# Patient Record
Sex: Male | Born: 1935 | Race: White | Hispanic: No | Marital: Married | Smoking: Former smoker
Health system: Southern US, Community
[De-identification: ages and names within clinical notes are randomized; demographics above are authoritative.]

## PROBLEM LIST (undated history)

## (undated) DIAGNOSIS — I2510 Atherosclerotic heart disease of native coronary artery without angina pectoris: Secondary | ICD-10-CM

## (undated) DIAGNOSIS — R062 Wheezing: Secondary | ICD-10-CM

## (undated) DIAGNOSIS — G4730 Sleep apnea, unspecified: Secondary | ICD-10-CM

## (undated) DIAGNOSIS — L97509 Non-pressure chronic ulcer of other part of unspecified foot with unspecified severity: Secondary | ICD-10-CM

## (undated) DIAGNOSIS — R0902 Hypoxemia: Secondary | ICD-10-CM

## (undated) DIAGNOSIS — H3560 Retinal hemorrhage, unspecified eye: Secondary | ICD-10-CM

## (undated) DIAGNOSIS — D7582 Heparin induced thrombocytopenia (HIT): Secondary | ICD-10-CM

## (undated) DIAGNOSIS — R001 Bradycardia, unspecified: Secondary | ICD-10-CM

## (undated) DIAGNOSIS — M47812 Spondylosis without myelopathy or radiculopathy, cervical region: Secondary | ICD-10-CM

## (undated) DIAGNOSIS — E119 Type 2 diabetes mellitus without complications: Secondary | ICD-10-CM

## (undated) DIAGNOSIS — I499 Cardiac arrhythmia, unspecified: Secondary | ICD-10-CM

## (undated) DIAGNOSIS — E782 Mixed hyperlipidemia: Secondary | ICD-10-CM

## (undated) DIAGNOSIS — K5900 Constipation, unspecified: Secondary | ICD-10-CM

## (undated) DIAGNOSIS — Z8719 Personal history of other diseases of the digestive system: Secondary | ICD-10-CM

## (undated) DIAGNOSIS — I10 Essential (primary) hypertension: Secondary | ICD-10-CM

## (undated) DIAGNOSIS — D649 Anemia, unspecified: Secondary | ICD-10-CM

## (undated) DIAGNOSIS — G4700 Insomnia, unspecified: Secondary | ICD-10-CM

## (undated) DIAGNOSIS — H3090 Unspecified chorioretinal inflammation, unspecified eye: Secondary | ICD-10-CM

## (undated) DIAGNOSIS — D75829 Heparin-induced thrombocytopenia, unspecified: Secondary | ICD-10-CM

## (undated) DIAGNOSIS — E11621 Type 2 diabetes mellitus with foot ulcer: Secondary | ICD-10-CM

## (undated) DIAGNOSIS — I739 Peripheral vascular disease, unspecified: Secondary | ICD-10-CM

## (undated) DIAGNOSIS — Z973 Presence of spectacles and contact lenses: Secondary | ICD-10-CM

## (undated) DIAGNOSIS — E039 Hypothyroidism, unspecified: Secondary | ICD-10-CM

## (undated) DIAGNOSIS — I482 Chronic atrial fibrillation, unspecified: Secondary | ICD-10-CM

## (undated) DIAGNOSIS — I5032 Chronic diastolic (congestive) heart failure: Secondary | ICD-10-CM

## (undated) DIAGNOSIS — I480 Paroxysmal atrial fibrillation: Secondary | ICD-10-CM

## (undated) DIAGNOSIS — N289 Disorder of kidney and ureter, unspecified: Secondary | ICD-10-CM

## (undated) DIAGNOSIS — J449 Chronic obstructive pulmonary disease, unspecified: Secondary | ICD-10-CM

## (undated) DIAGNOSIS — K219 Gastro-esophageal reflux disease without esophagitis: Secondary | ICD-10-CM

## (undated) HISTORY — DX: Type 2 diabetes mellitus without complications: E11.9

## (undated) HISTORY — DX: Spondylosis without myelopathy or radiculopathy, cervical region: M47.812

## (undated) HISTORY — DX: Heparin-induced thrombocytopenia, unspecified: D75.829

## (undated) HISTORY — DX: Insomnia, unspecified: G47.00

## (undated) HISTORY — PX: APPENDECTOMY: SHX54

## (undated) HISTORY — PX: BACK SURGERY: SHX140

## (undated) HISTORY — PX: RHINOPLASTY: SUR1284

## (undated) HISTORY — PX: TOTAL KNEE ARTHROPLASTY: SHX125

## (undated) HISTORY — DX: Gastro-esophageal reflux disease without esophagitis: K21.9

## (undated) HISTORY — PX: JOINT REPLACEMENT: SHX530

## (undated) HISTORY — DX: Hypothyroidism, unspecified: E03.9

## (undated) HISTORY — DX: Essential (primary) hypertension: I10

## (undated) HISTORY — DX: Mixed hyperlipidemia: E78.2

## (undated) HISTORY — DX: Heparin induced thrombocytopenia (HIT): D75.82

## (undated) HISTORY — DX: Peripheral vascular disease, unspecified: I73.9

## (undated) HISTORY — DX: Retinal hemorrhage, unspecified eye: H35.60

## (undated) HISTORY — PX: POSTERIOR LUMBAR FUSION: SHX6036

## (undated) HISTORY — PX: CARDIAC CATHETERIZATION: SHX172

## (undated) HISTORY — DX: Non-pressure chronic ulcer of other part of unspecified foot with unspecified severity: L97.509

## (undated) HISTORY — DX: Chronic atrial fibrillation, unspecified: I48.20

## (undated) HISTORY — DX: Type 2 diabetes mellitus with foot ulcer: E11.621

---

## 1998-06-28 ENCOUNTER — Inpatient Hospital Stay (HOSPITAL_COMMUNITY): Admission: EM | Admit: 1998-06-28 | Discharge: 1998-07-03 | Payer: Self-pay | Admitting: Cardiology

## 1998-12-12 ENCOUNTER — Encounter: Payer: Self-pay | Admitting: Neurosurgery

## 1998-12-12 ENCOUNTER — Ambulatory Visit (HOSPITAL_COMMUNITY): Admission: RE | Admit: 1998-12-12 | Discharge: 1998-12-12 | Payer: Self-pay | Admitting: Neurosurgery

## 1998-12-13 ENCOUNTER — Encounter: Payer: Self-pay | Admitting: Neurosurgery

## 1999-03-29 HISTORY — PX: TRANSURETHRAL RESECTION OF PROSTATE: SHX73

## 1999-06-03 ENCOUNTER — Encounter: Admission: RE | Admit: 1999-06-03 | Discharge: 1999-06-03 | Payer: Self-pay | Admitting: Neurosurgery

## 1999-06-03 ENCOUNTER — Encounter: Payer: Self-pay | Admitting: Neurosurgery

## 1999-07-29 ENCOUNTER — Encounter: Payer: Self-pay | Admitting: Neurosurgery

## 1999-07-29 ENCOUNTER — Encounter: Admission: RE | Admit: 1999-07-29 | Discharge: 1999-07-29 | Payer: Self-pay | Admitting: Neurosurgery

## 1999-11-08 ENCOUNTER — Encounter: Admission: RE | Admit: 1999-11-08 | Discharge: 1999-11-08 | Payer: Self-pay | Admitting: Neurosurgery

## 1999-11-08 ENCOUNTER — Encounter: Payer: Self-pay | Admitting: Neurosurgery

## 2000-02-13 ENCOUNTER — Ambulatory Visit (HOSPITAL_COMMUNITY): Admission: RE | Admit: 2000-02-13 | Discharge: 2000-02-13 | Payer: Self-pay | Admitting: Neurosurgery

## 2000-02-13 ENCOUNTER — Encounter: Payer: Self-pay | Admitting: Neurosurgery

## 2000-03-28 HISTORY — PX: LUMBAR FUSION: SHX111

## 2000-04-26 ENCOUNTER — Encounter: Payer: Self-pay | Admitting: Neurosurgery

## 2000-04-28 ENCOUNTER — Inpatient Hospital Stay (HOSPITAL_COMMUNITY): Admission: RE | Admit: 2000-04-28 | Discharge: 2000-05-02 | Payer: Self-pay | Admitting: Neurosurgery

## 2000-04-28 ENCOUNTER — Encounter: Payer: Self-pay | Admitting: Neurosurgery

## 2002-03-28 HISTORY — PX: CATARACT EXTRACTION: SUR2

## 2002-03-28 HISTORY — PX: COLONOSCOPY: SHX174

## 2003-03-14 ENCOUNTER — Encounter: Admission: RE | Admit: 2003-03-14 | Discharge: 2003-03-14 | Payer: Self-pay | Admitting: Neurosurgery

## 2010-03-23 ENCOUNTER — Encounter: Payer: Self-pay | Admitting: Cardiology

## 2010-03-24 ENCOUNTER — Encounter: Payer: Self-pay | Admitting: Physician Assistant

## 2010-03-24 ENCOUNTER — Encounter: Payer: Self-pay | Admitting: Cardiology

## 2010-03-26 ENCOUNTER — Inpatient Hospital Stay (HOSPITAL_COMMUNITY)
Admission: AD | Admit: 2010-03-26 | Discharge: 2010-03-31 | Payer: Self-pay | Attending: Cardiology | Admitting: Cardiology

## 2010-03-26 ENCOUNTER — Encounter: Payer: Self-pay | Admitting: Cardiology

## 2010-03-27 ENCOUNTER — Encounter: Payer: Self-pay | Admitting: Cardiology

## 2010-03-30 ENCOUNTER — Encounter: Payer: Self-pay | Admitting: Cardiology

## 2010-03-31 ENCOUNTER — Encounter: Payer: Self-pay | Admitting: Cardiology

## 2010-03-31 LAB — CBC
HCT: 42.5 % (ref 39.0–52.0)
Hemoglobin: 14.2 g/dL (ref 13.0–17.0)
MCH: 30.7 pg (ref 26.0–34.0)
MCHC: 33.4 g/dL (ref 30.0–36.0)
MCV: 91.8 fL (ref 78.0–100.0)
Platelets: 294 10*3/uL (ref 150–400)
RBC: 4.63 MIL/uL (ref 4.22–5.81)
RDW: 13.9 % (ref 11.5–15.5)
WBC: 10.8 10*3/uL — ABNORMAL HIGH (ref 4.0–10.5)

## 2010-03-31 LAB — BASIC METABOLIC PANEL
BUN: 16 mg/dL (ref 6–23)
CO2: 26 mEq/L (ref 19–32)
Calcium: 8.8 mg/dL (ref 8.4–10.5)
Chloride: 104 mEq/L (ref 96–112)
Creatinine, Ser: 0.88 mg/dL (ref 0.4–1.5)
GFR calc Af Amer: >60 mL/min (ref >60)
GFR calc non Af Amer: >60 mL/min (ref >60)
Glucose, Bld: 171 mg/dL — ABNORMAL HIGH (ref 70–99)
Potassium: 4.2 mEq/L (ref 3.5–5.1)
Sodium: 135 mEq/L (ref 135–145)

## 2010-03-31 LAB — GLUCOSE, CAPILLARY
Glucose-Capillary: 156 mg/dL — ABNORMAL HIGH (ref 70–99)
Glucose-Capillary: 188 mg/dL — ABNORMAL HIGH (ref 70–99)
Glucose-Capillary: 243 mg/dL — ABNORMAL HIGH (ref 70–99)

## 2010-04-14 DIAGNOSIS — E119 Type 2 diabetes mellitus without complications: Secondary | ICD-10-CM | POA: Insufficient documentation

## 2010-04-14 DIAGNOSIS — R0602 Shortness of breath: Secondary | ICD-10-CM | POA: Insufficient documentation

## 2010-04-15 ENCOUNTER — Ambulatory Visit
Admission: RE | Admit: 2010-04-15 | Discharge: 2010-04-15 | Payer: Self-pay | Source: Home / Self Care | Attending: Cardiology | Admitting: Cardiology

## 2010-04-15 DIAGNOSIS — I10 Essential (primary) hypertension: Secondary | ICD-10-CM | POA: Insufficient documentation

## 2010-04-15 DIAGNOSIS — I2510 Atherosclerotic heart disease of native coronary artery without angina pectoris: Secondary | ICD-10-CM | POA: Insufficient documentation

## 2010-04-15 DIAGNOSIS — I471 Supraventricular tachycardia: Secondary | ICD-10-CM | POA: Insufficient documentation

## 2010-04-18 ENCOUNTER — Encounter: Payer: Self-pay | Admitting: Neurosurgery

## 2010-04-21 NOTE — Discharge Summary (Addendum)
Mike Wright, Mike Wright NO.:  [id number]  MEDICAL RECORD NO.:  [id number]          PATIENT TYPE:  INP  LOCATION:  3709                         FACILITY:  [facility]  PHYSICIAN:  Verne Carrow, MDDATE OF BIRTH:  1933/12/31  DATE OF ADMISSION:  03/26/2010 DATE OF DISCHARGE:  03/31/2010                              DISCHARGE SUMMARY   PRIMARY CARDIOLOGIST:  Jonelle Sidle, MD  DISCHARGE DIAGNOSES: 1. Chest pain and dyspnea without objective evidence of ischemia. 2. Nonobstructive coronary artery disease by catheterization this     admission. 3. Recurrent supraventricular tachycardia. 4. Hypertension. 5. Hyperlipidemia. 6. Diabetes mellitus. 7. Remote tobacco abuse. 8. Obesity.  ALLERGIES:  HEPARIN causing heparin-induced thrombocytopenia and CODEINE which causes dyspnea.  PROCEDURES:  Right and left heart rate catheterization performed on March 30, 2010, revealing normal right heart pressures.  Cardiac output of 4.17 L per minute, cardiac index 1.9 L per minute per meter squared. Left heart catheterization revealed mild nonobstructive coronary artery disease with an EF of 60% to 65%.  HISTORY OF PRESENT ILLNESS:  A 77 year old male with the above problem list who over the past several weeks has been experiencing episodes of dyspnea, chest pressure, and lightheadedness lasting a few minutes to as much as 15 minutes and resolving spontaneously.  He presented to [facility] secondary to progressive symptoms and while there had a near-syncopal episode and was noted to be in supraventricular tachycardia with rates up to 150 beats per minute.  Decision was made to transfer him to [facility] for further evaluation of progressive dyspnea and chest pain as well as SVT.  HOSPITAL COURSE:  The patient did have an additional episode of chest discomfort while ambulating on the morning of March 30, 2009.  Decision was made to pursue catheterization which  took place on March 30, 2009, revealing mild nonobstructive CAD with normal LV function as well as normal right heart pressures.  PPI therapy was initiated.  The patient was seen by Dr. Lewayne Bunting with Electrophysiology with recommendation of addition of beta-blocker therapy initially followed by EP study and radiofrequency catheter ablation if intolerant to beta-blockers or SVT recurs.  The patient was placed on Toprol-XL 25 mg daily and to this point has not had recurrence of SVT.  He has been ambulating without recurrent symptoms or limitation.  He will be discharged home today in good condition.  DISCHARGE LABORATORY DATA:  Hemoglobin 14.2, hematocrit 42.5, WBC 10.8, platelets 294,000.  Sodium 135, potassium 4.2, chloride 104, CO2 26, BUN 16, creatinine 0.88, glucose 171.  Total bilirubin 0.5, alkaline phosphatase 46, AST 76, ALT 53, total protein 6.0, albumin 3.3, calcium 8.8.  Hemoglobin A1c 7.4.  BNP 118.  Total cholesterol 77, triglycerides 153, HDL 27, LDL 119.  DISPOSITION:  The patient will be discharged home today in good condition.  FOLLOWUP PLANS AND APPOINTMENTS:  We have arranged for follow up with Dr. Nona Dell in our [facility] on April 15, 2009, at 1:40 p.m.  DISCHARGE MEDICATIONS: 1. Lipitor 10 mg at bedtime. 2. Toprol-XL 25 mg daily. 3. Protonix 40 mg daily. 4. Actos 30 mg daily. 5. Aspirin  81 mg daily. 6. Cozaar 50 mg 2 tablets daily. 7. Diltiazem CD 180 mg daily. 8. Gemfibrozil 600 mg b.i.d. 9. Glipizide 10 mg b.i.d. 10.Hydrochlorothiazide 12.5 mg daily. 11.Metformin 1000 mg b.i.d. to be resumed on April 03, 2010. 12.Trazodone 50 mg at bedtime.  OUTSTANDING LABORATORY DATA AND STUDIES:  The patient will acquire followup lipids and LFTs given new statin therapy.  DURATION OF DISCHARGE ENCOUNTER:  Thiry five minutes including physician time.     Nicolasa Ducking, ANP   ______________________________ Verne Carrow,  MD    CB/MEDQ  D:  03/31/2010  T:  04/01/2010  Job:  [id number]  Electronically Signed by Nicolasa Ducking ANP on 04/19/2010 03:44:23 PM Electronically Signed by Verne Carrow MD on 04/21/2010 04:10:14 PM

## 2010-04-29 NOTE — Assessment & Plan Note (Signed)
Summary: [facility] PER [facility]   Visit Type:  Follow-up Primary Provider:  Dr. Ernestine Conrad   History of Present Illness: 77 year old male presents for followup. He was seen as an inpatient consult at [facility] back in December with an episode of symptomatic SVT associated with syncope and chest pain. Troponin I levels were normal, and d-dimer was increased to 1.45, with low probability ventilation/perfusion lung scan for pulmonary embolus. He was transferred to [facility] for further evaluation which included a cardiac catheterization outlined below, showing no obstructive CAD, and ultimately EP consultation. He was seen by Dr. Ladona Ridgel, with recommendation of medical therapy, and the possibility of a catheter-based ablation if symptoms recur. He was discharged on a combination of metoprolol and diltiazem.  He returns today, denies any significant palpitations, has had no progressive chest pain or syncope. Reports compliance with medications. He does describe some nocturia, also reports increased blood sugars to some degree. He has not seen Dr. Loney Hering back as yet.  I reviewed the findings of his cardiac testing, and we discussed the possibility of considering a catheter-based ablation if his PSVT is not well controlled with medical therapy.  We also discussed exercise. He reports bilateral knee arthritis which limits him to some degree.  Preventive Screening-Counseling & Management  Alcohol-Tobacco     Smoking Status: quit     Year Started: 1950's     Year Quit: 2008     Pack years: 2 PPD 40-50 years  Current Medications (verified): 1)  Lipitor 10 Mg Tabs (Atorvastatin Calcium) .... Take 1 Tablet By Mouth Once A Day At Bedtime 2)  Toprol Xl 25 Mg Xr24h-Tab (Metoprolol Succinate) .... Take 1 Tablet By Mouth Once A Day 3)  Protonix 40 Mg Solr (Pantoprazole Sodium) .... Take 1 Tablet By Mouth Once A Day 4)  Actos 30 Mg Tabs (Pioglitazone Hcl) .... Take 1 Tablet By Mouth Once A Day 5)   Aspirin 81 Mg Tbec (Aspirin) .... Take One Tablet By Mouth Daily 6)  Cozaar 50 Mg Tabs (Losartan Potassium) .... Take 2 Tablets By Mouth Daily 7)  Dilt-Cd 180 Mg Xr24h-Cap (Diltiazem Hcl Coated Beads) .... Take 1 Capsule By Mouth Once A Day 8)  Gemfibrozil 600 Mg Tabs (Gemfibrozil) .... Take 1 Tablet By Mouth Twice A Day 9)  Glipizide 10 Mg Tabs (Glipizide) .... Take 1 Tablet By Mouth Two Times A Day 10)  Hydrochlorothiazide 12.5 Mg Caps (Hydrochlorothiazide) .... Take 1 Capsule By Mouth Once A Day 11)  Metformin Hcl 1000 Mg Tabs (Metformin Hcl) .... Take 1 Tablet By Mouth Two Times A Day 12)  Trazodone Hcl 50 Mg Tabs (Trazodone Hcl) .... Take 1 Tablet By Mouth At Bedtime  Allergies (verified): 1)  ! Codeine 2)  ! Heparin  Comments:  Nurse/Medical Assistant: The patient's medication list and allergies were reviewed with the patient and were updated in the Medication and Allergy Lists.  Past History:  Family History: Last updated: 04/29/2010 Father: died age 39 with MI  Social History: Last updated: 04/29/2010 Single  Tobacco Use - Former.  Alcohol Use - yes, occasional  Past Medical History: CAD - nonobstructive PSVT Diabetes Type 2 G E R D Hypertension History of heparin-induced thrombocytopenia Hyperlipidemia  Past Surgical History: Appendectomy  Family History: Father: died age 41 with MI  Social History: Single  Tobacco Use - Former.  Alcohol Use - yes, occasional Smoking Status:  quit Pack years:  2 PPD 40-50 years  Review of Systems  The patient complains of weight gain and dyspnea on exertion.  The patient denies anorexia, fever, chest pain, syncope, peripheral edema, melena, and hematochezia.         Otherwise reviewed and negative.  Vital Signs:  Patient profile:   77 year old male Height:      70 inches Weight:      237 pounds BMI:     34.13 Pulse rate:   65 / minute BP sitting:   129 / 85  (left arm) Cuff size:   large  Vitals  Entered By: Carlye Grippe (April 15, 2010 1:29 PM)  Nutrition Counseling: Patient's BMI is greater than 25 and therefore counseled on weight management options.  Physical Exam  Additional Exam:  Obese male in no acute distress. HEENT: Conjunctiva and lids normal, oropharynx clear. Neck: Supple, no elevated JVP or bruits. No thyromegaly. Lungs: Clear to auscultation, nonlabored. Cardiac: Regular rate and rhythm, no S3. Abdomen: Soft, nontender, bowel sounds present Extremities: No pitting edema, distal pulses 1-2+. Skin: Warm and dry. Musculoskeletal: No kyphosis. Neuropsychiatric: Alert and oriented x3, affect appropriate.   Cardiac Cath  Procedure date:  03/31/2010  Findings:       Right atrial pressure 7, right ventricular pressure 29/5, right   ventricular end-diastolic pressure 10, pulmonary artery pressure 25/8   with a mean of 17, pulmonary capillary wedge pressure mean of 9.   Cardiac output 4.17 liters per minute.  Cardiac index 1.9 liters per   minute per meter squared.  Pulmonary artery saturation 57%.  Central   aortic saturation 93%.      ANGIOGRAPHIC FINDINGS:   1. The left main coronary artery had no obstructive disease.   2. Left anterior descending was a large vessel that coursed to the       apex and wrapped around the apex.  There were mild luminal       irregularities in the mid and distal vessel.  There was a moderate-       sized diagonal branch that had mild plaque disease.  There were no       focally obstructive lesions in this vessel.   3. The circumflex artery was a moderate-sized vessel that gave off a       small-caliber first obtuse marginal branch and a moderate-sized       second obtuse marginal branch.  There were mild luminal       irregularities in the proximal mid vessel.  The first obtuse       marginal branch was small in caliber, had 30% serial lesions.       Second obtuse marginal branch was moderate sized and had an ostial        30% stenosis.   4. The right coronary artery was a large dominant vessel that had a       discrete distal 30% plaque.  There were no flow-limiting lesions       noted in this vessel.   5. Left ventricular angiogram was performed in the RAO projection,       which showed normal left ventricular systolic function with       ejection fraction of 60-65%.   Impression & Recommendations:  Problem # 1:  PAROXYSMAL SUPRAVENTRICULAR TACHYCARDIA (ICD-427.0)  At this point quiescent. Plan to continue present doses of Toprol-XL and Cardizem CD. He has been evaluated by Dr. Ladona Ridgel, with the possibility of catheter-based ablation remaining if medical therapy is not effective. Follow up in 6 months.  His updated medication list for this problem includes:    Toprol Xl 25 Mg Xr24h-tab (Metoprolol succinate) .[name]... Take 1 tablet by mouth once a day    Aspirin 81 Mg Tbec (Aspirin) .[name]... Take one tablet by mouth daily    Dilt-cd 180 Mg Xr24h-cap (Diltiazem hcl coated beads) .[name]... Take 1 capsule by mouth once a day  Problem # 2:  CORONARY ATHEROSCLEROSIS NATIVE CORONARY ARTERY (ICD-414.01)  Mild, nonobstructive by recent cardiac catheterization.  His updated medication list for this problem includes:    Toprol Xl 25 Mg Xr24h-tab (Metoprolol succinate) .[name]... Take 1 tablet by mouth once a day    Aspirin 81 Mg Tbec (Aspirin) .[name]... Take one tablet by mouth daily    Dilt-cd 180 Mg Xr24h-cap (Diltiazem hcl coated beads) .[name]... Take 1 capsule by mouth once a day  Problem # 3:  ESSENTIAL HYPERTENSION, BENIGN (ICD-401.1)  Continue present medical regimen.  His updated medication list for this problem includes:    Toprol Xl 25 Mg Xr24h-tab (Metoprolol succinate) .[name]... Take 1 tablet by mouth once a day    Aspirin 81 Mg Tbec (Aspirin) .[name]... Take one tablet by mouth daily    Cozaar 50 Mg Tabs (Losartan potassium) .[name]... Take 2 tablets by mouth daily    Dilt-cd 180 Mg Xr24h-cap (Diltiazem hcl coated beads) .[name]... Take  1 capsule by mouth once a day    Hydrochlorothiazide 12.5 Mg Caps (Hydrochlorothiazide) .[name]... Take 1 capsule by mouth once a day  Problem # 4:  DM (ICD-250.00)  Continue follow up with Dr. Loney Hering.  His updated medication list for this problem includes:    Actos 30 Mg Tabs (Pioglitazone hcl) .[name]... Take 1 tablet by mouth once a day    Aspirin 81 Mg Tbec (Aspirin) .[name]... Take one tablet by mouth daily    Cozaar 50 Mg Tabs (Losartan potassium) .[name]... Take 2 tablets by mouth daily    Glipizide 10 Mg Tabs (Glipizide) .[name]... Take 1 tablet by mouth two times a day    Metformin Hcl 1000 Mg Tabs (Metformin hcl) .[name]... Take 1 tablet by mouth two times a day  Patient Instructions: 1)  Your physician wants you to follow-up in: 6 months. You will receive a reminder letter in the mail one-two months in advance. If you don't receive a letter, please call our office to schedule the follow-up appointment. 2)  Your physician recommends that you continue on your current medications as directed. Please refer to the Current Medication list given to you today.

## 2010-04-29 NOTE — Consult Note (Signed)
Summary: CARDIOLOGY CONSULT/  [facility]  CARDIOLOGY CONSULT/  [facility]   Imported By: Zachary Leemon 04/14/2010 15:25:35  _____________________________________________________________________  External Attachment:    Type:   Image     Comment:   External Document

## 2010-04-29 NOTE — Consult Note (Signed)
Summary: Consultation Report  Consultation Report   Imported By: Dorise Hiss 04/14/2010 14:59:17  _____________________________________________________________________  External Attachment:    Type:   Image     Comment:   External Document

## 2010-04-29 NOTE — Letter (Signed)
Summary: [facility] D/C DR. Charlyne Mom  [facility] D/C DR. PETER ENWEANA   Imported By: Zachary Jowell 04/14/2010 15:23:37  _____________________________________________________________________  External Attachment:    Type:   Image     Comment:   External Document

## 2010-04-29 NOTE — Medication Information (Signed)
Summary: [facility] D/C MEDICATION ORDER SHEET  [facility] D/C MEDICATION ORDER SHEET   Imported By: Zachary Toriano 04/14/2010 15:27:08  _____________________________________________________________________  External Attachment:    Type:   Image     Comment:   External Document

## 2010-05-06 ENCOUNTER — Encounter: Payer: Self-pay | Admitting: Cardiology

## 2010-05-06 NOTE — Consult Note (Signed)
NAME:  Mike Wright, Mike Wright NO.:  [id number]  MEDICAL RECORD NO.:  [id number]           PATIENT TYPE:  LOCATION:                                 FACILITY:  PHYSICIAN:  Doylene Canning. Ladona Ridgel, MD    DATE OF BIRTH:  1933/09/29  DATE OF CONSULTATION:  03/30/2010 DATE OF DISCHARGE:                                CONSULTATION   CONSULTATION IS REQUESTED:  Jonelle Sidle, MD  INDICATION FOR CONSULTATION:  Evaluation of recurrent SVT.  HISTORY OF PRESENT ILLNESS:  The patient is a 77 year old man who has a history of hypertension, diabetes, and obesity.  The patient over the last several weeks has noted spells where he would suddenly get short of breath and have chest pressure and get dizzy.  These would last anywhere from a few minutes to as much as 15 minutes.  These spells have increased in frequency and severity.  He had an episode associated with near-syncope and was admitted to the hospital.  On telemetry, he had recurrent episodes of SVT at rates of up to 150 beats per minute. Review of the strips demonstrate what appears to be a short RP tachycardia, though the [place] time is somewhat longer than typically seen with AV node reentrant tachycardia.  The patient notes that he has never felt palpitations and cannot tell that his heart is racing but does have very clearcut sudden onset of dyspnea, chest pressure, and dizziness associated with near-syncope and one episode of frank syncope with these episodes.  It appears that these episodes are related to his SVT.  The patient underwent catheterization which demonstrated no obstructive coronary disease, preserved LV function, and normal pulmonary pressures just today.  His additional past medical history is notable for heparin-induced thrombocytopenia.  He has a history of hypertension.  He has diabetes. Past medical history is notable for spontaneous pneumothorax, approximately 20 years ago  Family history is negative  for premature coronary disease.  SOCIAL HISTORY:  The patient denies tobacco or ethanol abuse.  He currently participates in the [employer] program in [place].  He gives a history of allergy to CODEINE and has heparin-induced thrombocytopenia as previously noted.  REVIEW OF SYSTEMS:  All system reviewed and negative except as noted in the HPI.  PHYSICAL EXAMINATION:  GENERAL:  He is a pleasant obese 77 year old man in no acute distress. VITAL SIGNS:  The blood pressure is 121/75, pulse is 60 and regular, the respirations are 18, and the temperature is 98, oxygen saturation 96%. HEENT:  Normocephalic and atraumatic.  Pupils equal and round.  The oropharynx moist.  His sclerae are anicteric. NECK:  No jugular venous distention.  There are no thyromegaly.  Trachea is midline.  The carotids are 2+ and symmetric. LUNGS:  Clear bilaterally to auscultation.  No wheezes, rales, or rhonchi are present.  There is no increased work of breathing. CARDIAC:  Regular rhythm.  Normal S1 and S2.  The PMI was not enlarged or laterally displaced.  I do not appreciate any murmurs. ABDOMINAL:  Obese, nontender, nondistended.  There is no organomegaly. EXTREMITIES:  No cyanosis, clubbing, or edema.  The pulses are 2+ and symmetric.  The groin demonstrated no hematoma. NEUROLOGIC:  Alert and oriented x3 with cranial nerves intact.  Strength is 5/5 and symmetric.  The EKG demonstrates sinus rhythm with no pre-excitation.  Telemetry strips demonstrate SVT at 145 beats per minute.  IMPRESSION: 1. Recurrent supraventricular tachycardia in the absence of     palpitations, likely associated with chest pain, shortness of     breath, and near-syncope. 2. Hypertension. 3. Obesity. 4. Diabetes.  DISCUSSION:  I have discussed the treatment options with the patient. He has been on low low-dose calcium blockers up until now.  For his hypertension, I have recommended that he start Toprol 25 mg daily  with possible uptitration as needed.  The risks, benefits, goals, and expectations of a catheter ablation procedure have been discussed as well.  Plan will be to start him on beta-blockers and see how he does. If his symptoms resolve on beta-blockers, then we would recommend a period of watchful waiting.  If he is intolerant of beta-blockers or has recurrent SVT despite this, then catheter ablation would be recommended.     Doylene Canning. Ladona Ridgel, MD   ______________________________ Doylene Canning. Ladona Ridgel, MD    GWT/MEDQ  D:  03/30/2010  T:  03/31/2010  Job:  [id number]  cc:   Jonelle Sidle, MD  Electronically Signed by Lewayne Bunting MD on 05/06/2010 05:08:49 PM

## 2010-05-13 NOTE — Medication Information (Signed)
Summary: [employer]:Lipitor/Gemfibrozil  [employer]:Lipitor/Gemfibrozil   Imported By: Cyril Loosen, RN, BSN 05/06/2010 16:59:06  _____________________________________________________________________  External Attachment:    Type:   Image     Comment:   External Document  Appended Document: [employer]:Lipitor/Gemfibrozil Attempted to reach pt but line was busy.  Appended Document: [employer]:Lipitor/Gemfibrozil Left message to call back on voicemail.  Appended Document: [employer]:Lipitor/Gemfibrozil Pt notified and verbalized understanding.

## 2010-06-07 LAB — POCT I-STAT 3, VENOUS BLOOD GAS (G3P V)
Acid-base deficit: 4 mmol/L — ABNORMAL HIGH (ref 0.0–2.0)
Bicarbonate: 22 mEq/L (ref 20.0–24.0)
O2 Saturation: 57 %
TCO2: 23 mmol/L (ref 0–100)
pCO2, Ven: 44.3 mmHg — ABNORMAL LOW (ref 45.0–50.0)
pH, Ven: 7.304 — ABNORMAL HIGH (ref 7.250–7.300)
pO2, Ven: 33 mmHg (ref 30.0–45.0)

## 2010-06-07 LAB — GLUCOSE, CAPILLARY
Glucose-Capillary: 103 mg/dL — ABNORMAL HIGH (ref 70–99)
Glucose-Capillary: 116 mg/dL — ABNORMAL HIGH (ref 70–99)
Glucose-Capillary: 125 mg/dL — ABNORMAL HIGH (ref 70–99)
Glucose-Capillary: 166 mg/dL — ABNORMAL HIGH (ref 70–99)
Glucose-Capillary: 178 mg/dL — ABNORMAL HIGH (ref 70–99)
Glucose-Capillary: 196 mg/dL — ABNORMAL HIGH (ref 70–99)
Glucose-Capillary: 218 mg/dL — ABNORMAL HIGH (ref 70–99)
Glucose-Capillary: 251 mg/dL — ABNORMAL HIGH (ref 70–99)

## 2010-06-07 LAB — COMPREHENSIVE METABOLIC PANEL
Albumin: 3.3 g/dL — ABNORMAL LOW (ref 3.5–5.2)
BUN: 26 mg/dL — ABNORMAL HIGH (ref 6–23)
Calcium: 9.1 mg/dL (ref 8.4–10.5)
Chloride: 101 mEq/L (ref 96–112)
Creatinine, Ser: 0.94 mg/dL (ref 0.4–1.5)
Total Bilirubin: 0.5 mg/dL (ref 0.3–1.2)
Total Protein: 6.8 g/dL (ref 6.0–8.3)

## 2010-06-07 LAB — CBC
HCT: 42.3 % (ref 39.0–52.0)
Hemoglobin: 14.2 g/dL (ref 13.0–17.0)
MCH: 30.6 pg (ref 26.0–34.0)
MCH: 30.6 pg (ref 26.0–34.0)
MCHC: 33.6 g/dL (ref 30.0–36.0)
MCHC: 33.9 g/dL (ref 30.0–36.0)
MCV: 90.2 fL (ref 78.0–100.0)
MCV: 91.2 fL (ref 78.0–100.0)
Platelets: 259 10*3/uL (ref 150–400)
RDW: 13.7 % (ref 11.5–15.5)

## 2010-06-07 LAB — LIPID PANEL
HDL: 27 mg/dL — ABNORMAL LOW (ref >39)
Triglycerides: 153 mg/dL — ABNORMAL HIGH (ref <150)
VLDL: 31 mg/dL (ref 0–40)

## 2010-06-07 LAB — PROTIME-INR: Prothrombin Time: 12.9 seconds (ref 11.6–15.2)

## 2010-06-07 LAB — POCT I-STAT 3, ART BLOOD GAS (G3+)
Bicarbonate: 24.4 mEq/L — ABNORMAL HIGH (ref 20.0–24.0)
O2 Saturation: 93 %
TCO2: 26 mmol/L (ref 0–100)
pCO2 arterial: 39.5 mmHg (ref 35.0–45.0)
pH, Arterial: 7.4 (ref 7.350–7.450)
pO2, Arterial: 68 mmHg — ABNORMAL LOW (ref 80.0–100.0)

## 2010-06-07 LAB — BASIC METABOLIC PANEL
CO2: 26 mEq/L (ref 19–32)
Calcium: 9.1 mg/dL (ref 8.4–10.5)
Glucose, Bld: 169 mg/dL — ABNORMAL HIGH (ref 70–99)
Potassium: 3.9 mEq/L (ref 3.5–5.1)
Sodium: 138 mEq/L (ref 135–145)

## 2010-06-07 LAB — BRAIN NATRIURETIC PEPTIDE: Pro B Natriuretic peptide (BNP): 118 pg/mL — ABNORMAL HIGH (ref 0.0–100.0)

## 2010-06-07 LAB — HEMOGLOBIN A1C: Mean Plasma Glucose: 166 mg/dL — ABNORMAL HIGH (ref <117)

## 2010-08-13 NOTE — H&P (Signed)
[facility]  Patient:    Mike Wright, Mike Wright                        MRN: [id number] Adm. Date:  04/28/00 Attending:  Payton Doughty, M.D.                         History and Physical  ADMISSION DIAGNOSES:  Spondylolysis L5, spondylosis L4-5, and spondylosis L3-4.  HISTORY:  This is a now 77 year old, right-handed white gentleman whom I have been following since 1995.  He has spondylolysis of L5 and has had a slip at L5-S1.  He has spondylitic disk at L4-5 and a face arthropathy with spondylitic change at L3-4.  He is now admitted for a fusion at L3-4, L4-5, and L5-S1.  PAST MEDICAL HISTORY:  Is remarkable for coronary artery disease, for which he has had a heart catheterization.  He has had a meniscectomy in his knee, and an appendectomy.  ALLERGIES:  CODEINE AND HEPARIN.  CURRENT MEDICATIONS: 1. Vicodin. 2. Celebrex.  SOCIAL HISTORY:  He smokes 1/2 pack of cigarettes a day.  Drinks alcohol socially, and is retired as an Dentist.  REVIEW OF SYSTEMS:  Remarkable for back pain and leg pain.  PHYSICAL EXAMINATION:  HEENT:  Within normal limits.  NECK:  He has a reasonable range of motion.  CHEST:  Clear.  CARDIAC:  Regular rate and rhythm.  ABDOMEN:  Nontender, no hepatosplenomegaly.  EXTREMITIES:  No cyanosis, no clubbing.  Peripheral pulses are good.  GENITOURINARY:  Deferred.  NEUROLOGIC:  He is awake, alert, and oriented.  His cranial nerves are intact. Motor examination reveals 5/5 strength throughout the upper and lower extremities, with pain-limited straight leg raise on the right, worse than on the left.  He cannot bend forward because of back pain.  MRI results have been reviewed above.  CLINICAL IMPRESSION:  Lumbar spondylosis at L3-4 and L4-5, with spondylolysis and a grade 1 verging on grade 2 slip at L5-S1.  PLAN:  A lumbar laminectomy, diskectomy, posterior lumbar interbody fusion, with Ray threaded fusion cage  at L4-5 and L5-S1, as well as at L3-4.  The plan is for pedicle screws.  The risks and benefits of this approach have been discussed with him.  He understands that if alignment can be obtained, and good reduction can be obtained, the pedicle screws may not be necessary at L4-5 and L5-S1. DD:  04/28/00 TD:  04/28/00 Job: [id number]

## 2010-08-13 NOTE — Op Note (Signed)
[place]. [facility]  Patient:    Mike Wright, Mike Wright                      MRN: [id number] Proc. Date: 04/28/00 Adm. 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  PROCEDURE:  L3-4, L4-5, L5-S1 laminectomy, diskectomy, posterior lumbar interbody fusion with the Ray Threaded Fusion Cage.  SURGEON:  Payton Doughty, M.D.  ASSISTANT:  Stefani Dama, M.D.  ANESTHESIA:  General endotracheal.  PREP:  Sterile Betadine prep and scrub with alcohol wipe.  COMPLICATIONS:  None.  DESCRIPTION OF PROCEDURE:  This is a 77 year old right-handed white gentleman with spondylolysis of 5 and severe spondylosis at 3-4 and 4-5.  He was taken to the operating room and smoothly anesthetized and intubated, placed prone on the operating table.  Following shave, prep, and drape in the usual sterile fashion, skin was infiltrated with 1% lidocaine and 1:400,000 epinephrine. Skin was incised from the top of L3 to the bottom of S1, and the laminae of L3, L4, L5, and S1 were exposed bilaterally in the subperiosteal plane out over the facet joints.  Intraoperative x-ray confirmed correctness of the level.  The pars interarticularis, laminae, and the inferior facet of L3 and L4 and the lamina and inferior facet of L5 were removed bilaterally.  The superior facet of L4, L5, and S1 were also removed bilaterally.  At L3-4, there was severely degenerated disk with lateral root compression bilaterally, slightly worse on the right than on the left.  At L4-5, there was also severe spondylosis with compressive pathology, worse on the 4 and 5 roots on the right side.  At L5-S1, there was spondylolysis with a grade 1 slip that reduced almost completely  with removal of the posterior elements and significant compression of the left L5 root in its lateral recess.  The right root was also significantly compressed.  S1 roots were relatively unaffected. All nerve roots were carefully explored and found to be free.  Diskectomy was then carried out at all three levels, and Ray Threaded Fusion Cages were placed.  Cages 12 x 21 mm were placed at L5-S1, and 14 x 26 mm cages were placed at 3-4 and 4-5.  Intraoperative x-ray showed good placement of the cages.  There was good reduction of the slip, and the cages had a good, tight fit.  The wound was irrigated and hemostasis assured, and the cages were packed with bone graft harvested from the facet joints and capped.  The fascia was reapproximated with 0 Vicryl in interrupted fashion, subcutaneous tissue was reapproximated with 0 Vicryl in interrupted fashion, and subcuticular tissue was reapproximated with 3-0 Vicryl in interrupted fashion.  Skin was closed with 3-0 nylon in a running locked fashion.  A Betadine Telfa dressing was applied and made occlusive with OpSite.  The patient then returned to the recovery room in good condition. DD:  04/28/00 TD:  04/29/00 Job: [id number]

## 2010-08-13 NOTE — Discharge Summary (Signed)
[place]. [facility]  Patient:    ANUJ, SUMMONS                      MRN: [id number] Adm. Date:  [id number] Disc. Date: [id number] Attending:  Emeterio Reeve                           Discharge Summary  ADMITTING DIAGNOSIS:  Spondylolysis of L5, spondylosis L5-S1 and L3-4.  PROCEDURE:  L3-4, L4-5, L5-S1 laminectomy, diskectomy, and posterior lumbar interbody fusion with right a ______ cage.  COMPLICATIONS:  None.  DISCHARGE STATUS:  Alive and well.  HISTORY OF PRESENT ILLNESS:  [age]-year-old right-handed white gentleman. History and physical is recounted in the chart.  He has had known spondylolysis of L5, he has had a slip at L5-S1 at least since 1995.  It has progressed and he has developed spondylitic change at L3-4 and L4-5 to the point where he is incapacitated with back and leg pain and he is admitted for a fusion.  MEDICAL HISTORY:  Coronary artery disease for which he has had a heart cath, meniscectomy in his knee, and appendectomy.  ALLERGIES:  CODEINE and HEPARIN.  MEDICATIONS:  Vicodin, Celebrex, and Altace.  SOCIAL HISTORY:  Smoked one-half pack of cigarettes a day, drinks alcohol socially and is retired.  PHYSICAL EXAMINATION:  GENERAL:  Unremarkable.  NEUROLOGIC:  Remarkable for full strength with pain limited straight leg on the right worse than on the left.  He has limited range of motion of his back because of back pain.  HOSPITAL COURSE:  He was admitted after ascertaining normal laboratory values and underwent a lumbar interbody fusion at L3-4, L4-5, and L5-S1. Postoperatively, he has done extremely well.  His right leg pain is gone and he has minimal left leg pain.  He has appropriate amount of incisional back pain.  He participated avidly in physical therapy and did extremely well and he was up walking with a walker.  He uses a 3-in-1 and a reacher as well.  He had some slowness of the bowel which has resolved.   He is off his PCA and is on Percocet 5/325 and is being discharged home with a followup in [facility] office in one week for suture removal. DD:  05/02/00 TD:  05/02/00 Job: [id number]

## 2011-03-25 ENCOUNTER — Encounter: Payer: Self-pay | Admitting: *Deleted

## 2011-04-01 ENCOUNTER — Encounter: Payer: Self-pay | Admitting: Cardiology

## 2011-04-01 ENCOUNTER — Ambulatory Visit (INDEPENDENT_AMBULATORY_CARE_PROVIDER_SITE_OTHER): Payer: Medicare Other | Admitting: Cardiology

## 2011-04-01 VITALS — BP 119/80 | HR 60 | Ht 70.0 in | Wt 229.0 lb

## 2011-04-01 DIAGNOSIS — I2510 Atherosclerotic heart disease of native coronary artery without angina pectoris: Secondary | ICD-10-CM

## 2011-04-01 DIAGNOSIS — E119 Type 2 diabetes mellitus without complications: Secondary | ICD-10-CM

## 2011-04-01 DIAGNOSIS — I10 Essential (primary) hypertension: Secondary | ICD-10-CM

## 2011-04-01 DIAGNOSIS — I471 Supraventricular tachycardia: Secondary | ICD-10-CM

## 2011-04-01 NOTE — Assessment & Plan Note (Signed)
Blood pressure well controlled

## 2011-04-01 NOTE — Progress Notes (Signed)
   Clinical Summary Mike Wright is a 78 y.o.male presenting for followup. He was seen in January of last year. Medical therapy has been pursued for management of PSVT.  He is doing well. No angina. Only rare palpitations, nothing prolonged. No dizziness or syncope.  He reports compliance with his medications. He continues to see Dr. Loney Hering regularly for management of diabetes and his lipids.  ECG reviewed.  Allergies  Allergen Reactions  . Codeine     REACTION: sob  . Heparin     REACTION: +HIT    Current Outpatient Prescriptions  Medication Sig Dispense Refill  . aspirin EC 81 MG tablet Take 81 mg by mouth daily.        [name] atorvastatin (LIPITOR) 10 MG tablet Take 10 mg by mouth daily.        [name] diltiazem (CARDIZEM CD) 180 MG 24 hr capsule Take 180 mg by mouth daily.        [name] glipiZIDE (GLUCOTROL) 10 MG tablet Take 10 mg by mouth 2 (two) times daily before a meal.        . hydrochlorothiazide (MICROZIDE) 12.5 MG capsule Take 12.5 mg by mouth daily.        . insulin glargine (LANTUS) 100 UNIT/ML injection Inject 10 Units into the skin at bedtime.        [name] losartan (COZAAR) 50 MG tablet Take 100 mg by mouth daily.        . metFORMIN (GLUCOPHAGE) 1000 MG tablet Take 1,000 mg by mouth 2 (two) times daily with a meal.        . metoprolol succinate (TOPROL-XL) 25 MG 24 hr tablet Take 25 mg by mouth daily.        . pantoprazole (PROTONIX) 40 MG tablet Take 40 mg by mouth daily.        . traZODone (DESYREL) 50 MG tablet Take 50-100 mg by mouth at bedtime.         Past Medical History  Diagnosis Date  . Coronary atherosclerosis of native coronary artery     Nonobstructive  . PSVT (paroxysmal supraventricular tachycardia)   . Type 2 diabetes mellitus   . GERD (gastroesophageal reflux disease)   . Essential hypertension, benign   . Mixed hyperlipidemia   . HIT (heparin-induced thrombocytopenia)     Social History Mike Wright reports that he quit smoking about 5 years ago. His smoking  use included Cigarettes. He has a 100 pack-year smoking history. He has never used smokeless tobacco. Mike Wright reports that he drinks alcohol.  Review of Systems As outlined above, otherwise negative.  Physical Examination Filed Vitals:   04/01/11 1418  BP: 119/80  Pulse: 60   Obese male in no acute distress.  HEENT: Conjunctiva and lids normal, oropharynx clear.  Neck: Supple, no elevated JVP or bruits. No thyromegaly.  Lungs: Clear to auscultation, nonlabored.  Cardiac: Regular rate and rhythm, no S3.  Abdomen: Soft, nontender, bowel sounds present  Extremities: No pitting edema, distal pulses 1-2+.  Skin: Warm and dry.  Musculoskeletal: No kyphosis.  Neuropsychiatric: Alert and oriented x3, affect appropriate.    Problem List and Plan

## 2011-04-01 NOTE — Patient Instructions (Signed)
Your physician you to follow up in 1 year. You will receive a reminder letter in the mail one-two months in advance. If you don't receive a letter, please call our office to schedule the follow-up appointment. Your physician recommends that you continue on your current medications as directed. Please refer to the Current Medication list given to you today. 

## 2011-04-01 NOTE — Assessment & Plan Note (Signed)
Continue followup with Dr. Loney Hering.

## 2011-04-01 NOTE — Assessment & Plan Note (Signed)
Nonobstructive, no active angina. Continue medical therapy. ECG reviewed.

## 2011-04-01 NOTE — Assessment & Plan Note (Signed)
Relatively well controlled on medical therapy. Plan to continue observation, realizing that catheter based ablation could be pursued if necessary. He is comfortable with an annual followup for now.

## 2015-06-11 ENCOUNTER — Ambulatory Visit (INDEPENDENT_AMBULATORY_CARE_PROVIDER_SITE_OTHER): Payer: Medicare HMO | Admitting: Cardiology

## 2015-06-11 ENCOUNTER — Encounter: Payer: Self-pay | Admitting: *Deleted

## 2015-06-11 ENCOUNTER — Encounter: Payer: Self-pay | Admitting: Cardiology

## 2015-06-11 VITALS — BP 146/83 | HR 90 | Ht 70.0 in | Wt 243.0 lb

## 2015-06-11 DIAGNOSIS — R011 Cardiac murmur, unspecified: Secondary | ICD-10-CM | POA: Diagnosis not present

## 2015-06-11 DIAGNOSIS — I2510 Atherosclerotic heart disease of native coronary artery without angina pectoris: Secondary | ICD-10-CM | POA: Diagnosis not present

## 2015-06-11 DIAGNOSIS — I471 Supraventricular tachycardia: Secondary | ICD-10-CM

## 2015-06-11 DIAGNOSIS — I4891 Unspecified atrial fibrillation: Secondary | ICD-10-CM

## 2015-06-11 DIAGNOSIS — Z01810 Encounter for preprocedural cardiovascular examination: Secondary | ICD-10-CM | POA: Diagnosis not present

## 2015-06-11 NOTE — Progress Notes (Addendum)
Patient ID: Mike Wright, male   DOB: September 05, 1933, 82 y.o.   MRN: [id number]     Clinical Summary Mike Wright is a 82 y.o.male last seen by Dr Domenic Polite in 2013, this is our first visit together. He is seen for the following medical problems. Referred by Dr Celedonio Savage.   1. Preoperative evaluation - denies any chest pain. DOE at <1/2 block, that is stable. Denies any LE edema - exertion limited by knee pain  2. PSVT - denies any recent palpitations.   3. CAD - nonobstructive CAD by cath Jan 2012, LVEF 60-65% by LV gram.  - no recent chest pain  4. HTN - compliant with meds  5. SOB - tobacco x 20-30 years. Can have some wheezing at times, chronic cough.    Past Medical History  Diagnosis Date  . Coronary atherosclerosis of native coronary artery     Nonobstructive  . PSVT (paroxysmal supraventricular tachycardia)   . Type 2 diabetes mellitus   . GERD (gastroesophageal reflux disease)   . Essential hypertension, benign   . Mixed hyperlipidemia   . HIT (heparin-induced thrombocytopenia)      Allergies  Allergen Reactions  . Codeine     REACTION: sob  . Heparin     REACTION: +HIT     Current Outpatient Prescriptions  Medication Sig Dispense Refill  . aspirin EC 81 MG tablet Take 81 mg by mouth daily.      [name] atorvastatin (LIPITOR) 10 MG tablet Take 10 mg by mouth daily.      [name] diltiazem (CARDIZEM CD) 180 MG 24 hr capsule Take 180 mg by mouth daily.      [name] glipiZIDE (GLUCOTROL) 10 MG tablet Take 10 mg by mouth 2 (two) times daily before a meal.      . hydrochlorothiazide (MICROZIDE) 12.5 MG capsule Take 12.5 mg by mouth daily.      . insulin glargine (LANTUS) 100 UNIT/ML injection Inject 10 Units into the skin at bedtime.      [name] losartan (COZAAR) 50 MG tablet Take 100 mg by mouth daily.      . metFORMIN (GLUCOPHAGE) 1000 MG tablet Take 1,000 mg by mouth 2 (two) times daily with a meal.      . metoprolol succinate (TOPROL-XL) 25 MG 24 hr tablet Take 25 mg by mouth  daily.      . pantoprazole (PROTONIX) 40 MG tablet Take 40 mg by mouth daily.      . traZODone (DESYREL) 50 MG tablet Take 50-100 mg by mouth at bedtime.      No current facility-administered medications for this visit.     Past Surgical History  Procedure Laterality Date  . Appendectomy       Allergies  Allergen Reactions  . Codeine     REACTION: sob  . Heparin     REACTION: +HIT      Family History  Problem Relation Age of Onset  . Heart attack Father 41     Social History Mike Wright reports that he quit smoking about 9 years ago. His smoking use included Cigarettes. He has a 100 pack-year smoking history. He has never used smokeless tobacco. Mike Wright reports that he drinks alcohol.   Review of Systems CONSTITUTIONAL: No weight loss, fever, chills, weakness or fatigue.  HEENT: Eyes: No visual loss, blurred vision, double vision or yellow sclerae.No hearing loss, sneezing, congestion, runny nose or sore throat.  SKIN: No rash or itching.  CARDIOVASCULAR: per HPI RESPIRATORY:  No shortness of breath, cough or sputum.  GASTROINTESTINAL: No anorexia, nausea, vomiting or diarrhea. No abdominal pain or blood.  GENITOURINARY: No burning on urination, no polyuria NEUROLOGICAL: No headache, dizziness, syncope, paralysis, ataxia, numbness or tingling in the extremities. No change in bowel or bladder control.  MUSCULOSKELETAL: knee pain LYMPHATICS: No enlarged nodes. No history of splenectomy.  PSYCHIATRIC: No history of depression or anxiety.  ENDOCRINOLOGIC: No reports of sweating, cold or heat intolerance. No polyuria or polydipsia.  [name]   Physical Examination Filed Vitals:   06/11/15 1102  BP: 146/83  Pulse: 90   Filed Vitals:   06/11/15 1102  Height: 5\' 10"  (1.778 m)  Weight: 243 lb (110.224 kg)    Gen: resting comfortably, no acute distress HEENT: no scleral icterus, pupils equal round and reactive, no palptable cervical adenopathy,  CV: RRR, 3/6 systolic  murmur RUSB, no jvd Resp: Clear to auscultation bilaterally GI: abdomen is soft, non-tender, non-distended, normal bowel sounds, no hepatosplenomegaly MSK: extremities are warm, no edema.  Skin: warm, no rash Neuro:  no focal deficits Psych: appropriate affect     Assessment and Plan  1. Preoperative evaluation - unable to assess exercise status by history due to knee pain - significant systolic murmur on exam, will obtain echo - pending echo, likely will need stress testing to further risk stratify  2. PSVT - no recent palpitations, continue to monitor  3. CAD - nonobstructive disease by cath 2012 - no recent symptoms - likely stress test to help stratify preoperative risk  4. SOB - f/u echo and likely stress test. Also with symptoms suggestive of COPD, pending cardiac testing may need PFTs.  5. Afib - EKG shows afib, this is a new finding for the patient.  - we will need to discuss possible anticoag at our next follow up, f/u echo results.      Arnoldo Lenis, M.D.    07/03/15 Addendum Cardiac testing is overall benign, recommend proceeding with surgery as planned. Hold eliquis starting 2 days prior to surgery, may resume one day after.    Zandra Abts MD

## 2015-06-11 NOTE — Patient Instructions (Signed)
Your physician recommends that you schedule a follow-up appointment TO BE DETERMINED AFTER TESTING  Your physician recommends that you continue on your current medications as directed. Please refer to the Current Medication list given to you today.  Your physician has requested that you have an echocardiogram. Echocardiography is a painless test that uses sound waves to create images of your heart. It provides your doctor with information about the size and shape of your heart and how well your heart's chambers and valves are working. This procedure takes approximately one hour. There are no restrictions for this procedure.  Thank you for choosing [facility]!!    

## 2015-06-17 ENCOUNTER — Ambulatory Visit (INDEPENDENT_AMBULATORY_CARE_PROVIDER_SITE_OTHER): Payer: Medicare HMO | Admitting: *Deleted

## 2015-06-17 ENCOUNTER — Other Ambulatory Visit: Payer: Self-pay

## 2015-06-17 ENCOUNTER — Ambulatory Visit (INDEPENDENT_AMBULATORY_CARE_PROVIDER_SITE_OTHER): Payer: Medicare HMO

## 2015-06-17 DIAGNOSIS — R011 Cardiac murmur, unspecified: Secondary | ICD-10-CM | POA: Diagnosis not present

## 2015-06-17 DIAGNOSIS — I4891 Unspecified atrial fibrillation: Secondary | ICD-10-CM | POA: Diagnosis not present

## 2015-06-17 MED ORDER — APIXABAN 5 MG PO TABS: 5.0000 mg | ORAL_TABLET | Freq: Two times a day (BID) | ORAL | Status: DC

## 2015-06-17 MED ORDER — DILTIAZEM HCL ER COATED BEADS 240 MG PO CP24: 240.0000 mg | ORAL_CAPSULE | Freq: Every day | ORAL | Status: DC

## 2015-06-17 NOTE — Patient Instructions (Signed)
   Increase Diltiazem to 240mg  daily - new 90 day sent to [facility] today.  Begin Eliquis 5mg  twice a day  - free 30 day trial card & printed script given today. Continue all other medications.   Follow up in 1 month with Lattie Haw (anticoagulation nurse) for new management of Eliquis. Follow up with Dr. [name] will be determined after he officially reads the Echo done today.

## 2015-06-22 ENCOUNTER — Telehealth: Payer: Self-pay | Admitting: *Deleted

## 2015-06-22 DIAGNOSIS — Z01818 Encounter for other preprocedural examination: Secondary | ICD-10-CM

## 2015-06-22 NOTE — Telephone Encounter (Signed)
LM on VM to return call.

## 2015-06-22 NOTE — Telephone Encounter (Signed)
-----   Message from Arnoldo Lenis, MD sent at 06/22/2015 12:46 PM EDT ----- Echo shows heart pumping function is normal. His aortic valve is moderatly thickened (moderate aortic stenosis), but this is not to the degree where it is affecting the heart and something we will monitor. In order to better risk stratify him for surgery please order a lexiscan  Zandra Abts MD

## 2015-06-23 NOTE — Telephone Encounter (Signed)
Mike Wright returned a telephone call.

## 2015-06-24 ENCOUNTER — Encounter: Payer: Self-pay | Admitting: *Deleted

## 2015-06-24 ENCOUNTER — Telehealth: Payer: Self-pay | Admitting: Cardiology

## 2015-06-24 NOTE — Telephone Encounter (Signed)
Lexiscan scheduled for 4/5 at [facility]. Checking percert for [employer]

## 2015-06-24 NOTE — Telephone Encounter (Signed)
Pt aware and agreeable to stress test. Orders placed and will forward to schedulers.

## 2015-07-01 ENCOUNTER — Encounter (HOSPITAL_COMMUNITY): Payer: Medicare HMO

## 2015-07-01 ENCOUNTER — Ambulatory Visit (HOSPITAL_COMMUNITY): Payer: Medicare HMO

## 2015-07-02 ENCOUNTER — Encounter (HOSPITAL_COMMUNITY): Payer: Self-pay

## 2015-07-02 ENCOUNTER — Encounter (HOSPITAL_COMMUNITY)
Admission: RE | Admit: 2015-07-02 | Discharge: 2015-07-02 | Disposition: A | Discharge status: Home / Self Care | Payer: Medicare HMO | Source: Ambulatory Visit | Attending: Cardiology | Admitting: Cardiology

## 2015-07-02 ENCOUNTER — Inpatient Hospital Stay (HOSPITAL_COMMUNITY): Admission: RE | Admit: 2015-07-02 | Payer: Medicare HMO | Source: Ambulatory Visit

## 2015-07-02 DIAGNOSIS — R079 Chest pain, unspecified: Secondary | ICD-10-CM | POA: Insufficient documentation

## 2015-07-02 DIAGNOSIS — Z01818 Encounter for other preprocedural examination: Secondary | ICD-10-CM | POA: Diagnosis not present

## 2015-07-02 LAB — NM MYOCAR MULTI W/SPECT W/WALL MOTION / EF
CHL CUP NUCLEAR SRS: 5
CHL CUP NUCLEAR SSS: 5
CSEPPHR: 83 {beats}/min
LVDIAVOL: 103 mL (ref 62–150)
LVSYSVOL: 37 mL
NUC STRESS TID: 1.17
RATE: 0.31
Rest HR: 59 {beats}/min
SDS: 0

## 2015-07-02 MED ORDER — TECHNETIUM TC 99M SESTAMIBI GENERIC - CARDIOLITE
30.0000 | Freq: Once | INTRAVENOUS | Status: AC | PRN
  Administered 2015-07-02: 30.5 via INTRAVENOUS

## 2015-07-02 MED ORDER — REGADENOSON 0.4 MG/5ML IV SOLN
INTRAVENOUS | Status: AC
  Administered 2015-07-02: 0.4 mg via INTRAVENOUS
  Filled 2015-07-02: qty 5

## 2015-07-02 MED ORDER — TECHNETIUM TC 99M SESTAMIBI - CARDIOLITE
10.0000 | Freq: Once | INTRAVENOUS | Status: AC | PRN
  Administered 2015-07-02: 08:00:00 11 via INTRAVENOUS

## 2015-07-02 MED ORDER — SODIUM CHLORIDE 0.9% FLUSH
INTRAVENOUS | Status: AC
  Filled 2015-07-02: qty 100

## 2015-07-02 MED ORDER — SODIUM CHLORIDE 0.9% FLUSH
INTRAVENOUS | Status: AC
  Administered 2015-07-02: 10 mL via INTRAVENOUS
  Filled 2015-07-02: qty 10

## 2015-07-03 ENCOUNTER — Telehealth: Payer: Self-pay | Admitting: *Deleted

## 2015-07-03 NOTE — Telephone Encounter (Signed)
Pt aware, routed to Dr Case and Wenda Overland

## 2015-07-03 NOTE — Telephone Encounter (Signed)
-----   Message from Arnoldo Lenis, MD sent at 07/03/2015  1:38 PM EDT ----- Please let patient know stress test overall look good. From our standpoint he is ok for surgery, please forward my addended note to his surgeon. He will need to hold eliquis 2 days before surgery and restart day after  J BrancH MD

## 2015-07-14 ENCOUNTER — Ambulatory Visit (INDEPENDENT_AMBULATORY_CARE_PROVIDER_SITE_OTHER): Payer: Medicare HMO | Admitting: *Deleted

## 2015-07-14 DIAGNOSIS — Z5181 Encounter for therapeutic drug level monitoring: Secondary | ICD-10-CM | POA: Diagnosis not present

## 2015-07-14 DIAGNOSIS — I4891 Unspecified atrial fibrillation: Secondary | ICD-10-CM

## 2015-07-14 MED ORDER — APIXABAN 5 MG PO TABS: 5.0000 mg | ORAL_TABLET | Freq: Two times a day (BID) | ORAL | Status: DC

## 2015-07-14 NOTE — Progress Notes (Signed)
Pt was started on Eliquis 5mg  bid for atrial fibrillation on 06/17/15 by Dr [name].    Labs 06/11/15:  SrCr 0.72  CrCl 129.70  Hgb 12.9  Hct 39.7  Wt. 243  Pt has not had any problems taking Eliquis.  Denies excessive bruising, bleeding or GI upset.  Reviewed patients medication list.  Pt is not currently on any combined P-gp and strong CYP3A4 inhibitors/inducers (ketoconazole, traconazole, ritonavir, carbamazepine, phenytoin, rifampin, St. John's wort).  Reviewed labs 07/14/15 @ [facility]:  SCr 0.87, Weight 230, CrCl 101.59.  Dose is appropriate based on age, weight and SrCr.   Hgb and HCT:  13.7/40.1   Pt has lost 13 lbs on a low carb/low sugar diet for his DM.  A full discussion of the nature of anticoagulants has been carried out.  A benefit/risk analysis has been presented to the patient, so that they understand the justification for choosing anticoagulation with Eliquis at this time.  The need for compliance is stressed.  Pt is aware to take the medication twice daily.  Side effects of potential bleeding are discussed, including unusual colored urine or stools, coughing up blood or coffee ground emesis, nose bleeds or serious fall or head trauma.  Discussed signs and symptoms of stroke. The patient should avoid any OTC items containing aspirin or ibuprofen.  Avoid alcohol consumption.   Call if any signs of abnormal bleeding.  Discussed financial obligations and resolved any difficulty in obtaining medication.  Next lab test test in 6 months.  Placed in recall

## 2015-07-15 ENCOUNTER — Telehealth: Payer: Self-pay | Admitting: *Deleted

## 2015-07-15 LAB — CBC
HEMATOCRIT: 40.1 % (ref 37.5–51.0)
Hemoglobin: 13.7 g/dL (ref 12.6–17.7)
MCH: 30.2 pg (ref 26.6–33.0)
MCHC: 34.2 g/dL (ref 31.5–35.7)
MCV: 88 fL (ref 79–97)
Platelets: 181 10*3/uL (ref 150–379)
RBC: 4.54 x10E6/uL (ref 4.14–5.80)
RDW: 15.6 % — AB (ref 12.3–15.4)
WBC: 5.6 10*3/uL (ref 3.4–10.8)

## 2015-07-15 LAB — BASIC METABOLIC PANEL
BUN/Creatinine Ratio: 23 (ref 10–24)
BUN: 20 mg/dL (ref 8–27)
CHLORIDE: 100 mmol/L (ref 96–106)
CO2: 21 mmol/L (ref 18–29)
CREATININE: 0.87 mg/dL (ref 0.76–1.27)
Calcium: 9.2 mg/dL (ref 8.6–10.2)
GFR calc Af Amer: 95 mL/min/{1.73_m2} (ref >59)
GFR calc non Af Amer: 82 mL/min/{1.73_m2} (ref >59)
GLUCOSE: 128 mg/dL — AB (ref 65–99)
Potassium: 4.1 mmol/L (ref 3.5–5.2)
SODIUM: 140 mmol/L (ref 134–144)

## 2015-07-15 NOTE — Telephone Encounter (Signed)
Pt aware, routed to pcp 

## 2015-07-15 NOTE — Telephone Encounter (Signed)
-----   Message from Arnoldo Lenis, MD sent at 07/15/2015  9:25 AM EDT ----- Labs look good  Zandra Abts MD

## 2015-09-18 ENCOUNTER — Ambulatory Visit: Payer: Medicare HMO | Admitting: Cardiology

## 2015-10-20 ENCOUNTER — Encounter: Payer: Self-pay | Admitting: Cardiology

## 2015-10-20 ENCOUNTER — Encounter: Payer: Self-pay | Admitting: *Deleted

## 2015-10-20 ENCOUNTER — Ambulatory Visit (INDEPENDENT_AMBULATORY_CARE_PROVIDER_SITE_OTHER): Payer: Medicare HMO | Admitting: Cardiology

## 2015-10-20 VITALS — BP 112/65 | HR 62 | Ht 70.0 in | Wt 213.4 lb

## 2015-10-20 DIAGNOSIS — I350 Nonrheumatic aortic (valve) stenosis: Secondary | ICD-10-CM | POA: Diagnosis not present

## 2015-10-20 DIAGNOSIS — I2510 Atherosclerotic heart disease of native coronary artery without angina pectoris: Secondary | ICD-10-CM | POA: Diagnosis not present

## 2015-10-20 DIAGNOSIS — I471 Supraventricular tachycardia: Secondary | ICD-10-CM

## 2015-10-20 DIAGNOSIS — I4891 Unspecified atrial fibrillation: Secondary | ICD-10-CM | POA: Diagnosis not present

## 2015-10-20 NOTE — Patient Instructions (Signed)

## 2015-10-20 NOTE — Progress Notes (Signed)
Clinical Summary [name] is a 82 y.o.male seen for the follow up of the following medical problems.   1. PSVT - denies any recent palpitations  2. CAD - nonobstructive CAD by cath Jan 2012, LVEF 60-65% by LV gram.  - 06/2015 nuclear stress without clear ischemia - 05/2015 echo LVEF 65-70%, no WMAs, cannot evaluate diastolic function  - just occasional SOB. Just occasional chest pain, nonspecific.  - compliant with meds  3. HTN - compliant with meds  4. Aortic stenosis - mild by echo 05/2015. Mean grad 13, AVA VTI 1.5 - no significant new symptoms  5. Afib - no recent palpitations. 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    Past Medical History:  Diagnosis Date  . Coronary atherosclerosis of native coronary artery    Nonobstructive  . Essential hypertension, benign   . GERD (gastroesophageal reflux disease)   . HIT (heparin-induced thrombocytopenia) ([place])   . Mixed hyperlipidemia   . PSVT (paroxysmal supraventricular tachycardia) ([place])   . Type 2 diabetes mellitus (HCC)      Allergies  Allergen Reactions  . Codeine     REACTION: sob  . Heparin     REACTION: +HIT     Current Outpatient Prescriptions  Medication Sig Dispense Refill  . apixaban (ELIQUIS) 5 MG TABS tablet Take 1 tablet (5 mg total) by mouth 2 (two) times daily. 60 tablet 4  . aspirin EC 81 MG tablet Take 81 mg by mouth daily.      . Cholecalciferol (D3-1000) 1000 units tablet Take 2,000 Units by mouth daily.    . Cyanocobalamin (B-12) 2500 MCG TABS Take 1 tablet by mouth daily.    [name] diltiazem (CARDIZEM CD) 240 MG 24 hr capsule Take 1 capsule (240 mg total) by mouth daily. 90 capsule 3  . finasteride (PROSCAR) 5 MG tablet Take 1 tablet by mouth daily.    . hydrochlorothiazide (HYDRODIURIL) 25 MG tablet Take 1 tablet by mouth daily.    . insulin glargine (LANTUS) 100 UNIT/ML injection Inject 60 Units into the skin 2 (two)  times daily.     . Magnesium 250 MG TABS Take 500 mg by mouth daily.    . metFORMIN (GLUCOPHAGE) 1000 MG tablet Take 1,000 mg by mouth 2 (two) times daily with a meal.      . metoprolol succinate (TOPROL-XL) 50 MG 24 hr tablet Take 1.5 tablets by mouth daily.    . mirabegron ER (MYRBETRIQ) 25 MG TB24 tablet Take 50 mg by mouth daily.    . traZODone (DESYREL) 50 MG tablet Take 50-100 mg by mouth at bedtime.      No current facility-administered medications for this visit.      Past Surgical History:  Procedure Laterality Date  . APPENDECTOMY       Allergies  Allergen Reactions  . Codeine     REACTION: sob  . Heparin     REACTION: +HIT      Family History  Problem Relation Age of Onset  . Heart attack Father 63     Social History Mr. Mike reports that he quit smoking about 32 years ago. His smoking use included Cigarettes. He started smoking about 62 years ago. He has a 60.00 pack-year smoking history. He has never used smokeless tobacco. Mr. Wright reports that he drinks alcohol.   Review of Systems CONSTITUTIONAL: No weight loss, fever, chills, weakness or fatigue.  HEENT: Eyes: No visual loss, blurred vision, double vision or yellow sclerae.No hearing loss, sneezing, congestion, runny nose or sore throat.  SKIN: No rash or itching.  CARDIOVASCULAR: per HPI RESPIRATORY: per HPI GASTROINTESTINAL: No anorexia, nausea, vomiting or diarrhea. No abdominal pain or blood.  GENITOURINARY: No burning on urination, no polyuria NEUROLOGICAL: No headache, dizziness, syncope, paralysis, ataxia, numbness or tingling in the extremities. No change in bowel or bladder control.  MUSCULOSKELETAL: No muscle, back pain, joint pain or stiffness.  LYMPHATICS: No enlarged nodes. No history of splenectomy.  PSYCHIATRIC: No history of depression or anxiety.  ENDOCRINOLOGIC: No reports of sweating, cold or heat intolerance. No polyuria or polydipsia.  [name]   Physical Examination Vitals:    10/20/15 1533  BP: 112/65  Pulse: 62   Vitals:   10/20/15 1533  Weight: 213 lb 6.4 oz (96.8 kg)  Height: 5\' 10"  (1.778 m)    Gen: resting comfortably, no acute distress HEENT: no scleral icterus, pupils equal round and reactive, no palptable cervical adenopathy,  CV: RRR, 2/6 sysotlic murmur RUSB Resp: Clear to auscultation bilaterally GI: abdomen is soft, non-tender, non-distended, normal bowel sounds, no hepatosplenomegaly MSK: extremities are warm, no edema.  Skin: warm, no rash Neuro:  no focal deficits Psych: appropriate affect   Diagnostic Studies 05/2015 echo Study Conclusions  - Left ventricle: The cavity size was normal. Wall thickness was   increased increased in a pattern of mild to moderate LVH.   Systolic function was vigorous. The estimated ejection fraction   was in the range of 65% to 70%. Wall motion was normal; there   were no regional wall motion abnormalities. The study is not   technically sufficient to allow evaluation of LV diastolic   function. - Aortic valve: Moderately calcified annulus. Mildly thickened   leaflets. There was mild stenosis. There was mild regurgitation.   Mean gradient (S): 13 mm Hg. Valve area (VTI): 1.51 cm^2. - Mitral valve: Mildly calcified annulus. Normal thickness leaflets   . - Left atrium: The atrium was moderately dilated. - Technically adequate study.   06/2015 Nuclear stress test  No diagnostic ST segment changes to indicate ischemia.  Small, mild intensity, perfusion defects noted in the apical anterior and basal inferolateral walls. This is most consistent with soft tissue attenuation given normal wall motion in these regions. No large ischemic zones noted.  This is a low risk study.  Nuclear stress EF: 64%.    Assessment and Plan   1. PSVT - no recent palpitations, we will continue to monitor  2. CAD - nonobstructive disease by cath 2012 - no recent symptoms - continue current meds  3. SOB -  fairly benign cardiac findings. Consider PFTs in the future if symptoms progress  4. Afib - no recent symptoms - CHADS2Vasc score is 3, continue anticoag  5. Aortic stenosis - mild by echo earler this year, conitnue to monitor. No significant symptoms.    Arnoldo Lenis, M.D.

## 2015-11-20 ENCOUNTER — Other Ambulatory Visit: Payer: Self-pay | Admitting: *Deleted

## 2015-11-20 MED ORDER — APIXABAN 5 MG PO TABS: 5.0000 mg | ORAL_TABLET | Freq: Two times a day (BID) | ORAL | 3 refills | Status: DC

## 2015-11-20 MED ORDER — APIXABAN 5 MG PO TABS: 5.0000 mg | ORAL_TABLET | Freq: Two times a day (BID) | ORAL | 0 refills | Status: DC

## 2015-11-23 ENCOUNTER — Other Ambulatory Visit: Payer: Self-pay | Admitting: *Deleted

## 2015-11-23 MED ORDER — APIXABAN 5 MG PO TABS: 5.0000 mg | ORAL_TABLET | Freq: Two times a day (BID) | ORAL | 3 refills | Status: DC

## 2016-05-23 ENCOUNTER — Ambulatory Visit: Payer: Medicare HMO | Admitting: Cardiology

## 2016-05-26 ENCOUNTER — Encounter: Payer: Self-pay | Admitting: *Deleted

## 2016-05-27 ENCOUNTER — Encounter: Payer: Self-pay | Admitting: *Deleted

## 2016-05-27 ENCOUNTER — Encounter: Payer: Self-pay | Admitting: Cardiology

## 2016-05-27 ENCOUNTER — Ambulatory Visit (INDEPENDENT_AMBULATORY_CARE_PROVIDER_SITE_OTHER): Payer: Medicare HMO | Admitting: Cardiology

## 2016-05-27 VITALS — BP 124/79 | HR 96 | Ht 70.0 in | Wt 225.0 lb

## 2016-05-27 DIAGNOSIS — I471 Supraventricular tachycardia, unspecified: Secondary | ICD-10-CM

## 2016-05-27 DIAGNOSIS — R002 Palpitations: Secondary | ICD-10-CM | POA: Diagnosis not present

## 2016-05-27 DIAGNOSIS — I4891 Unspecified atrial fibrillation: Secondary | ICD-10-CM | POA: Diagnosis not present

## 2016-05-27 DIAGNOSIS — I10 Essential (primary) hypertension: Secondary | ICD-10-CM | POA: Diagnosis not present

## 2016-05-27 DIAGNOSIS — I350 Nonrheumatic aortic (valve) stenosis: Secondary | ICD-10-CM

## 2016-05-27 NOTE — Progress Notes (Signed)
Clinical Summary Mike Wright is a 83 y.o.male seen for the follow up of the following medical problems.   1. PSVT - occasional palpitations at times. Can have some associated SOB.  - gets tingle in left arm. Rare chest tightness at times across entire chest.  - episodes will last about 10 minutes. Occurs 2 time day. Feels like heart is skipping.  - symptoms ongoing less than a year.    2. CAD - nonobstructive CAD by cath Jan 2012, LVEF 60-65% by LV gram.  - 06/2015 nuclear stress without clear ischemia - 05/2015 echo LVEF 65-70%, no WMAs, cannot evaluate diastolic function  - just occasional SOB. Just occasional chest pain, nonspecific.  - compliant with meds  3. HTN - compliant with meds  4. Aortic stenosis - mild by echo 05/2015. Mean grad 13, AVA VTI 1.5 - no significant new symptoms since last visit  5. 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   Past Medical History:  Diagnosis Date  . Coronary atherosclerosis of native coronary artery    Nonobstructive  . Essential hypertension, benign   . GERD (gastroesophageal reflux disease)   . HIT (heparin-induced thrombocytopenia) ([place])   . Mixed hyperlipidemia   . PSVT (paroxysmal supraventricular tachycardia) ([place])   . Type 2 diabetes mellitus (HCC)      Allergies  Allergen Reactions  . Codeine     REACTION: sob  . Heparin     REACTION: +HIT     Current Outpatient Prescriptions  Medication Sig Dispense Refill  . apixaban (ELIQUIS) 5 MG TABS tablet Take 1 tablet (5 mg total) by mouth 2 (two) times daily. 180 tablet 3  . aspirin EC 81 MG tablet Take 81 mg by mouth daily.      . Cholecalciferol (D3-1000) 1000 units tablet Take 2,000 Units by mouth daily.    . Cyanocobalamin (B-12) 2500 MCG TABS Take 1 tablet by mouth daily.    [name] diltiazem (CARDIZEM CD) 240 MG 24 hr capsule Take 1 capsule (240 mg total) by mouth daily. 90 capsule 3  .  finasteride (PROSCAR) 5 MG tablet Take 1 tablet by mouth daily.    . hydrochlorothiazide (HYDRODIURIL) 25 MG tablet Take 1 tablet by mouth daily.    . insulin glargine (LANTUS) 100 UNIT/ML injection Inject 70 Units into the skin at bedtime.     . Magnesium 250 MG TABS Take 500 mg by mouth daily.    . metFORMIN (GLUCOPHAGE) 1000 MG tablet Take 1,000 mg by mouth 2 (two) times daily with a meal.      . metoprolol succinate (TOPROL-XL) 50 MG 24 hr tablet Take 1.5 tablets by mouth daily.    . mirabegron ER (MYRBETRIQ) 25 MG TB24 tablet Take 50 mg by mouth daily.    . traZODone (DESYREL) 50 MG tablet Take 50-100 mg by mouth at bedtime.      No current facility-administered medications for this visit.      Past Surgical History:  Procedure Laterality Date  . APPENDECTOMY       Allergies  Allergen Reactions  . Codeine     REACTION: sob  . Heparin     REACTION: +HIT      Family History  Problem Relation Age of Onset  . Heart attack Father 71     Social History Mr. Pegg reports that he quit smoking about 33 years ago. His smoking  use included Cigarettes. He started smoking about 63 years ago. He has a 60.00 pack-year smoking history. He has never used smokeless tobacco. Mr. Ohman reports that he drinks alcohol.   Review of Systems CONSTITUTIONAL: No weight loss, fever, chills, weakness or fatigue.  HEENT: Eyes: No visual loss, blurred vision, double vision or yellow sclerae.No hearing loss, sneezing, congestion, runny nose or sore throat.  SKIN: No rash or itching.  CARDIOVASCULAR: per hpi RESPIRATORY: per hpi  GASTROINTESTINAL: No anorexia, nausea, vomiting or diarrhea. No abdominal pain or blood.  GENITOURINARY: No burning on urination, no polyuria NEUROLOGICAL: No headache, dizziness, syncope, paralysis, ataxia, numbness or tingling in the extremities. No change in bowel or bladder control.  MUSCULOSKELETAL: No muscle, back pain, joint pain or stiffness.  LYMPHATICS: No  enlarged nodes. No history of splenectomy.  PSYCHIATRIC: No history of depression or anxiety.  ENDOCRINOLOGIC: No reports of sweating, cold or heat intolerance. No polyuria or polydipsia.  [name]   Physical Examination Vitals:   05/27/16 1300  BP: 124/79  Pulse: 96   Vitals:   05/27/16 1300  Weight: 225 lb (102.1 kg)  Height: 5\' 10"  (1.778 m)    Gen: resting comfortably, no acute distress HEENT: no scleral icterus, pupils equal round and reactive, no palptable cervical adenopathy,  CV: RRR, no m/r/g, no jvd Resp: Clear to auscultation bilaterally GI: abdomen is soft, non-tender, non-distended, normal bowel sounds, no hepatosplenomegaly MSK: extremities are warm, no edema.  Skin: warm, no rash Neuro:  no focal deficits Psych: appropriate affect   Diagnostic Studies  05/2015 echo Study Conclusions  - Left ventricle: The cavity size was normal. Wall thickness was increased increased in a pattern of mild to moderate LVH. Systolic function was vigorous. The estimated ejection fraction was in the range of 65% to 70%. Wall motion was normal; there were no regional wall motion abnormalities. The study is not technically sufficient to allow evaluation of LV diastolic function. - Aortic valve: Moderately calcified annulus. Mildly thickened leaflets. There was mild stenosis. There was mild regurgitation. Mean gradient (S): 13 mm Hg. Valve area (VTI): 1.51 cm^2. - Mitral valve: Mildly calcified annulus. Normal thickness leaflets . - Left atrium: The atrium was moderately dilated. - Technically adequate study.   06/2015 Nuclear stress test  No diagnostic ST segment changes to indicate ischemia.  Small, mild intensity, perfusion defects noted in the apical anterior and basal inferolateral walls. This is most consistent with soft tissue attenuation given normal wall motion in these regions. No large ischemic zones noted.  This is a low risk study.  Nuclear  stress EF: 64%.    Assessment and Plan  1. PSVT - recent palpitations. EKG in clinic today shows rate controlled afib - we will obtain a 7 day monitor to further evaluate symptoms, evaluate rate control.  2. CAD - nonobstructive disease by cath 2012 - no recent symptoms - he will continue current m  3. Afib - obtain heart monitor as described above - CHADS2Vasc score is 3,, he will  continue anticoag  4. Aortic stenosis - conitnue to monitor. No significant symptoms.    Arnoldo Lenis, M.D.    F/u 6 weeks. Request pcp labs. Consider statin pending labs.

## 2016-05-27 NOTE — Patient Instructions (Signed)
Medication Instructions:  Continue all current medications.  Labwork: none  Testing/Procedures:  Your physician has recommended that you wear a 7 day event monitor. Event monitors are medical devices that record the heart's electrical activity. Doctors most often us these monitors to diagnose arrhythmias. Arrhythmias are problems with the speed or rhythm of the heartbeat. The monitor is a small, portable device. You can wear one while you do your normal daily activities. This is usually used to diagnose what is causing palpitations/syncope (passing out).  Office will contact with results via phone or letter.    Follow-Up: 6 weeks   Any Other Special Instructions Will Be Listed Below (If Applicable).  If you need a refill on your cardiac medications before your next appointment, please call your pharmacy.  

## 2016-06-09 ENCOUNTER — Telehealth: Payer: Self-pay | Admitting: Cardiology

## 2016-06-09 NOTE — Telephone Encounter (Signed)
Patient has not heard from monitor company

## 2016-06-09 NOTE — Telephone Encounter (Signed)
Pt aware that we will contact [employer] - pt will call us back if he hasn't heard from monitor company by Tuesday

## 2016-06-10 ENCOUNTER — Other Ambulatory Visit: Payer: Self-pay | Admitting: Cardiology

## 2016-06-20 ENCOUNTER — Ambulatory Visit (INDEPENDENT_AMBULATORY_CARE_PROVIDER_SITE_OTHER): Payer: Medicare HMO

## 2016-06-20 DIAGNOSIS — R002 Palpitations: Secondary | ICD-10-CM

## 2016-07-06 ENCOUNTER — Encounter: Payer: Self-pay | Admitting: Cardiology

## 2016-07-06 ENCOUNTER — Ambulatory Visit (INDEPENDENT_AMBULATORY_CARE_PROVIDER_SITE_OTHER): Payer: Medicare HMO | Admitting: Cardiology

## 2016-07-06 ENCOUNTER — Encounter: Payer: Self-pay | Admitting: *Deleted

## 2016-07-06 VITALS — BP 154/88 | HR 67 | Ht 70.0 in | Wt 226.0 lb

## 2016-07-06 DIAGNOSIS — R002 Palpitations: Secondary | ICD-10-CM

## 2016-07-06 DIAGNOSIS — I4891 Unspecified atrial fibrillation: Secondary | ICD-10-CM | POA: Diagnosis not present

## 2016-07-06 DIAGNOSIS — I471 Supraventricular tachycardia: Secondary | ICD-10-CM

## 2016-07-06 MED ORDER — METOPROLOL SUCCINATE ER 100 MG PO TB24: 100.0000 mg | ORAL_TABLET | Freq: Every day | ORAL | 1 refills | Status: DC

## 2016-07-06 NOTE — Progress Notes (Signed)
Clinical Summary Mike Wright is a 83 y.o.male seen for the follow up of the following medical problems. This is a focused visit on recent symptoms of palpitations.   1. PSVT/Palpitations.  - occasional palpitations at times. Can have some associated SOB.  - gets tingle in left arm. Rare chest tightness at times across entire chest.  - episodes will last about 10 minutes. Occurs 2 time day. Feels like heart is skipping.  - symptoms ongoing less than a year.   - mild symptoms since last visit, overall tolerable.   2. 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      Past Medical History:  Diagnosis Date  . Coronary atherosclerosis of native coronary artery    Nonobstructive  . Essential hypertension, benign   . GERD (gastroesophageal reflux disease)   . HIT (heparin-induced thrombocytopenia) ([place])   . Mixed hyperlipidemia   . PSVT (paroxysmal supraventricular tachycardia) ([place])   . Type 2 diabetes mellitus (HCC)      Allergies  Allergen Reactions  . Codeine     REACTION: sob  . Heparin     REACTION: +HIT  . Oxycodone Other (See Comments)    "Made me act out of my mind"     Current Outpatient Prescriptions  Medication Sig Dispense Refill  . apixaban (ELIQUIS) 5 MG TABS tablet Take 1 tablet (5 mg total) by mouth 2 (two) times daily. 180 tablet 3  . CARTIA XT 240 MG 24 hr capsule TAKE 1 CAPSULE EVERY DAY  (DOSE  INCREASE) 90 capsule 3  . Cholecalciferol (D3-1000) 1000 units tablet Take 2,000 Units by mouth daily.    . Cyanocobalamin (B-12) 2500 MCG TABS Take 1 tablet by mouth daily.    . finasteride (PROSCAR) 5 MG tablet Take 1 tablet by mouth daily.    . hydrochlorothiazide (HYDRODIURIL) 25 MG tablet Take 1 tablet by mouth daily.      . insulin glargine (LANTUS) 100 UNIT/ML injection Inject 70 Units into the skin at bedtime.     . Magnesium 250 MG TABS Take 500 mg by mouth daily.    . metFORMIN (GLUCOPHAGE) 1000 MG tablet Take 1,000 mg by mouth 2 (two) times daily with a meal.      . metoprolol succinate (TOPROL-XL) 50 MG 24 hr tablet Take 1.5 tablets by mouth daily.    . Potassium 95 MG TABS Take 1 tablet by mouth daily.    . traZODone (DESYREL) 100 MG tablet Take 1 tablet by mouth at bedtime.     No current facility-administered medications for this visit.      Past Surgical History:  Procedure Laterality Date  . APPENDECTOMY       Allergies  Allergen Reactions  . Codeine     REACTION: sob  . Heparin     REACTION: +HIT  . Oxycodone Other (See Comments)    "Made me act out of my mind"      Family History  Problem Relation Age of Onset  . Heart attack Father 41     Social History Mr. Kouba reports that he quit smoking about 33 years ago. His smoking use included Cigarettes. He started smoking about 63 years ago. He has a 60.00 pack-year smoking history. He  has never used smokeless tobacco. Mr. Tall reports that he drinks alcohol.   Review of Systems CONSTITUTIONAL: No weight loss, fever, chills, weakness or fatigue.  HEENT: Eyes: No visual loss, blurred vision, double vision or yellow sclerae.No hearing loss, sneezing, congestion, runny nose or sore throat.  SKIN: No rash or itching.  CARDIOVASCULAR: per HPI RESPIRATORY: No shortness of breath, cough or sputum.  GASTROINTESTINAL: No anorexia, nausea, vomiting or diarrhea. No abdominal pain or blood.  GENITOURINARY: No burning on urination, no polyuria NEUROLOGICAL: No headache, dizziness, syncope, paralysis, ataxia, numbness or tingling in the extremities. No change in bowel or bladder control.  MUSCULOSKELETAL: No muscle, back pain, joint pain or stiffness.  LYMPHATICS: No enlarged nodes. No history of splenectomy.  PSYCHIATRIC: No history  of depression or anxiety.  ENDOCRINOLOGIC: No reports of sweating, cold or heat intolerance. No polyuria or polydipsia.  [name]   Physical Examination Vitals:   07/06/16 1407  BP: (!) 154/88  Pulse: 67   Vitals:   07/06/16 1407  BP: (!) 154/88  Pulse: 67   Vitals:   07/06/16 1407  Weight: 226 lb (102.5 kg)  Height: 5\' 10"  (1.778 m)    Gen: resting comfortably, no acute distress HEENT: no scleral icterus, pupils equal round and reactive, no palptable cervical adenopathy,  CV: irreg, no m/r/g, no jvd Resp: Clear to auscultation bilaterally GI: abdomen is soft, non-tender, non-distended, normal bowel sounds, no hepatosplenomegaly MSK: extremities are warm, no edema.  Skin: warm, no rash Neuro:  no focal deficits Psych: appropriate affect   Diagnostic Studies  05/2015 echo Study Conclusions  - Left ventricle: The cavity size was normal. Wall thickness was increased increased in a pattern of mild to moderate LVH. Systolic function was vigorous. The estimated ejection fraction was in the range of 65% to 70%. Wall motion was normal; there were no regional wall motion abnormalities. The study is not technically sufficient to allow evaluation of LV diastolic function. - Aortic valve: Moderately calcified annulus. Mildly thickened leaflets. There was mild stenosis. There was mild regurgitation. Mean gradient (S): 13 mm Hg. Valve area (VTI): 1.51 cm^2. - Mitral valve: Mildly calcified annulus. Normal thickness leaflets . - Left atrium: The atrium was moderately dilated. - Technically adequate study.   06/2015 Nuclear stress test  No diagnostic ST segment changes to indicate ischemia.  Small, mild intensity, perfusion defects noted in the apical anterior and basal inferolateral walls. This is most consistent with soft tissue attenuation given normal wall motion in these regions. No large ischemic zones noted.  This is a low risk study.  Nuclear stress  EF: 64%.  05/2016 Event monitor  Rhythm is atrial fibrillation throughout study. Occasioanal PVCs  Min HR 51, Max HR 107, Avg HR 67  Reported symptoms correspond with rate controlled atrial fibrillation.    Assessment and Plan  1. PSVT/Palpitations.  - recent palpitations monitor shows rate controlled afib, no other arrhythmias - we will increase Toprol XL from 75mg  to 100mg  daily and follow symptoms.    2. Afib - increase Toprol as described above, continue anticoaglation.CHADS2Vasc score is 3,    F/u 6 months   Arnoldo Lenis, M.D.

## 2016-07-06 NOTE — Patient Instructions (Signed)
Your physician wants you to follow-up in: [place] will receive a reminder letter in the mail two months in advance. If you don't receive a letter, please call our office to schedule the follow-up appointment.  Your physician has recommended you make the following change in your medication:   INCREASE TOPROL XL 100 MG DAILY  Thank you for choosing [facility]!!

## 2016-09-30 ENCOUNTER — Encounter: Payer: Self-pay | Admitting: Cardiology

## 2016-09-30 ENCOUNTER — Ambulatory Visit (INDEPENDENT_AMBULATORY_CARE_PROVIDER_SITE_OTHER): Payer: Medicare HMO | Admitting: Cardiology

## 2016-09-30 ENCOUNTER — Encounter: Payer: Self-pay | Admitting: *Deleted

## 2016-09-30 ENCOUNTER — Telehealth: Payer: Self-pay | Admitting: Cardiology

## 2016-09-30 VITALS — BP 135/72 | HR 58 | Ht 70.0 in | Wt 219.0 lb

## 2016-09-30 DIAGNOSIS — I471 Supraventricular tachycardia: Secondary | ICD-10-CM

## 2016-09-30 DIAGNOSIS — R002 Palpitations: Secondary | ICD-10-CM

## 2016-09-30 DIAGNOSIS — I350 Nonrheumatic aortic (valve) stenosis: Secondary | ICD-10-CM

## 2016-09-30 DIAGNOSIS — R079 Chest pain, unspecified: Secondary | ICD-10-CM | POA: Diagnosis not present

## 2016-09-30 DIAGNOSIS — I4891 Unspecified atrial fibrillation: Secondary | ICD-10-CM | POA: Diagnosis not present

## 2016-09-30 DIAGNOSIS — I10 Essential (primary) hypertension: Secondary | ICD-10-CM

## 2016-09-30 MED ORDER — FUROSEMIDE 20 MG PO TABS: 20.0000 mg | ORAL_TABLET | Freq: Every day | ORAL | 3 refills | Status: DC | PRN

## 2016-09-30 MED ORDER — NITROGLYCERIN 0.4 MG SL SUBL: 0.4000 mg | SUBLINGUAL_TABLET | SUBLINGUAL | 3 refills | Status: DC | PRN

## 2016-09-30 NOTE — Telephone Encounter (Signed)
Pre-cert Verification for the following procedure    Lexiscan scheduled for 10/07/16 at [facility]

## 2016-09-30 NOTE — Progress Notes (Signed)
Clinical Summary Mike Wright is a 83 y.o.male seen today for follow up of the following medical problems.   1. PSVT/Palpitations.  - mild symptoms since last visit, overall tolerable.   2. Afib - since last visit we obtained a 7 day event monitor for palpitations that showed rate controlled afib - mild palpitations since last visit  3. Chronic diastolic HF - admit to [facility] 07/2016 with acute on chronic systolic HF - since discharge can have some occasional SOB. Often comes on at rest, can get bruing area I midchest. +SOB. Lasts for 10 minutes. Started about 2 months ago.  - no recent edema.   4. CAD - nonobstructive CAD by cath Jan 2012, LVEF 60-65% by LV gram.  - 06/2015 nuclear stress without clear ischemia - 05/2015 echo LVEF 65-70%, no WMAs, cannot evaluate diastolic function  - just occasional SOB. Just occasional chest pain, nonspecific.  - compliant with meds  5. HTN - compliant with meds  6. Aortic stenosis - mild by echo 05/2015. 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     Past Medical History:  Diagnosis Date  . Coronary atherosclerosis of native coronary artery    Nonobstructive  . Essential hypertension, benign   . GERD (gastroesophageal reflux disease)   . HIT (heparin-induced thrombocytopenia) ([place])   . Mixed hyperlipidemia   . PSVT (paroxysmal supraventricular tachycardia) ([place])   . Type 2 diabetes mellitus (HCC)      Allergies  Allergen Reactions  . Codeine     REACTION: sob  . Heparin     REACTION: +HIT  . Oxycodone Other (See Comments)    "Made me act out of my mind"     Current Outpatient Prescriptions  Medication Sig Dispense Refill  . apixaban (ELIQUIS) 5 MG TABS tablet Take 1 tablet (5 mg total) by mouth 2 (two) times daily. 180 tablet 3  . CARTIA XT 240 MG 24 hr capsule TAKE 1 CAPSULE EVERY DAY  (DOSE  INCREASE) 90 capsule 3   . Cholecalciferol (D3-1000) 1000 units tablet Take 2,000 Units by mouth daily.    . Cyanocobalamin (B-12) 2500 MCG TABS Take 1 tablet by mouth daily.    . finasteride (PROSCAR) 5 MG tablet Take 1 tablet by mouth daily.    . hydrochlorothiazide (HYDRODIURIL) 25 MG tablet Take 1 tablet by mouth daily.    . insulin glargine (LANTUS) 100 UNIT/ML injection Inject 70 Units into the skin at bedtime.     . Magnesium 250 MG TABS Take 500 mg by mouth daily.    . metFORMIN (GLUCOPHAGE) 1000 MG tablet Take 1,000 mg by mouth 2 (two) times daily with a meal.      . metoprolol succinate (TOPROL XL) 100 MG 24 hr tablet Take 1 tablet (100 mg total) by mouth daily. 90 tablet 1  . Potassium 95 MG TABS Take 1 tablet by mouth daily.    . traZODone (DESYREL) 100 MG tablet Take 1 tablet by mouth at bedtime.     No current facility-administered medications for this visit.      Past Surgical History:  Procedure Laterality Date  . APPENDECTOMY       Allergies  Allergen Reactions  . Codeine     REACTION: sob  . Heparin     REACTION: +HIT  . Oxycodone Other (See Comments)    "Made me  act out of my mind"      Family History  Problem Relation Age of Onset  . Heart attack Father 50     Social History Mike Wright reports that he quit smoking about 33 years ago. His smoking use included Cigarettes. He started smoking about 63 years ago. He has a 60.00 pack-year smoking history. He has never used smokeless tobacco. Mike Wright reports that he drinks alcohol.   Review of Systems CONSTITUTIONAL: No weight loss, fever, chills, weakness or fatigue.  HEENT: Eyes: No visual loss, blurred vision, double vision or yellow sclerae.No hearing loss, sneezing, congestion, runny nose or sore throat.  SKIN: No rash or itching.  CARDIOVASCULAR: per hpi RESPIRATORY: No shortness of breath, cough or sputum.  GASTROINTESTINAL: No anorexia, nausea, vomiting or diarrhea. No abdominal pain or blood.  GENITOURINARY: No  burning on urination, no polyuria NEUROLOGICAL: No headache, dizziness, syncope, paralysis, ataxia, numbness or tingling in the extremities. No change in bowel or bladder control.  MUSCULOSKELETAL: No muscle, back pain, joint pain or stiffness.  LYMPHATICS: No enlarged nodes. No history of splenectomy.  PSYCHIATRIC: No history of depression or anxiety.  ENDOCRINOLOGIC: No reports of sweating, cold or heat intolerance. No polyuria or polydipsia.  [name]   Physical Examination Vitals:   09/30/16 1614  BP: 135/72  Pulse: (!) 58   Vitals:   09/30/16 1614  Weight: 219 lb (99.3 kg)  Height: 5\' 10"  (1.778 m)    Gen: resting comfortably, no acute distress HEENT: no scleral icterus, pupils equal round and reactive, no palptable cervical adenopathy,  CV: irreg, no m/r/g, no jvd Resp: Clear to auscultation bilaterally GI: abdomen is soft, non-tender, non-distended, normal bowel sounds, no hepatosplenomegaly MSK: extremities are warm, no edema.  Skin: warm, no rash Neuro:  no focal deficits Psych: appropriate affect   Diagnostic Studies 05/2015 echo Study Conclusions  - Left ventricle: The cavity size was normal. Wall thickness was increased increased in a pattern of mild to moderate LVH. Systolic function was vigorous. The estimated ejection fraction was in the range of 65% to 70%. Wall motion was normal; there were no regional wall motion abnormalities. The study is not technically sufficient to allow evaluation of LV diastolic function. - Aortic valve: Moderately calcified annulus. Mildly thickened leaflets. There was mild stenosis. There was mild regurgitation. Mean gradient (S): 13 mm Hg. Valve area (VTI): 1.51 cm^2. - Mitral valve: Mildly calcified annulus. Normal thickness leaflets . - Left atrium: The atrium was moderately dilated. - Technically adequate study.   06/2015 Nuclear stress test  No diagnostic ST segment changes to indicate  ischemia.  Small, mild intensity, perfusion defects noted in the apical anterior and basal inferolateral walls. This is most consistent with soft tissue attenuation given normal wall motion in these regions. No large ischemic zones noted.  This is a low risk study.  Nuclear stress EF: 64%.  05/2016 Event monitor  Rhythm is atrial fibrillation throughout study. Occasioanal PVCs  Min HR 51, Max HR 107, Avg HR 67  Reported symptoms correspond with rate controlled atrial fibrillation.    Assessment and Plan  1. PSVT/Palpitations.  - monitor shows rate controlled afib - mild symptoms at times, we will continue current meds   2. Afib - continue current meds - continue anticoaglation.CHADS2Vasc score is 3,    3. CAD - nonobstructive disease by cath 2012 - recent chest pain and SOB, we will obtain a lexiscan - give RX for prn SL NG  4. Aortic stenosis -  conitnue to monitor, no recent symptoms.   5. CHronic diastolic HF - swelling at times, will give Rx for lasix 20mg  prn       Arnoldo Lenis, M.D.,

## 2016-09-30 NOTE — Patient Instructions (Signed)
Your physician recommends that you schedule a follow-up appointment in: [place] has recommended you make the following change in your medication:   START LASIX 20 MG DAILY AS NEEDED FOR SWELLING  Nitroglycerin sublingual tablets What is this medicine? NITROGLYCERIN (nye troe GLI ser in) is a type of vasodilator. It relaxes blood vessels, increasing the blood and oxygen supply to your heart. This medicine is used to relieve chest pain caused by angina. It is also used to prevent chest pain before activities like climbing stairs, going outdoors in cold weather, or sexual activity. This medicine may be used for other purposes; ask your health care provider or pharmacist if you have questions. COMMON BRAND NAME(S): Nitroquick, Nitrostat, Nitrotab What should I tell my health care provider before I take this medicine? They need to know if you have any of these conditions: -anemia -head injury, recent stroke, or bleeding in the brain -liver disease -previous heart attack -an unusual or allergic reaction to nitroglycerin, other medicines, foods, dyes, or preservatives -pregnant or trying to get pregnant -breast-feeding How should I use this medicine? Take this medicine by mouth as needed. At the first sign of an angina attack (chest pain or tightness) place one tablet under your tongue. You can also take this medicine 5 to 10 minutes before an event likely to produce chest pain. Follow the directions on the prescription label. Let the tablet dissolve under the tongue. Do not swallow whole. Replace the dose if you accidentally swallow it. It will help if your mouth is not dry. Saliva around the tablet will help it to dissolve more quickly. Do not eat or drink, smoke or chew tobacco while a tablet is dissolving. If you are not better within 5 minutes after taking ONE dose of nitroglycerin, call 9-1-1 immediately to seek emergency medical care. Do not take more than 3  nitroglycerin tablets over 15 minutes. If you take this medicine often to relieve symptoms of angina, your doctor or health care professional may provide you with different instructions to manage your symptoms. If symptoms do not go away after following these instructions, it is important to call 9-1-1 immediately. Do not take more than 3 nitroglycerin tablets over 15 minutes. Talk to your pediatrician regarding the use of this medicine in children. Special care may be needed. Overdosage: If you think you have taken too much of this medicine contact a poison control center or emergency room at once. NOTE: This medicine is only for you. Do not share this medicine with others. What if I miss a dose? This does not apply. This medicine is only used as needed. What may interact with this medicine? Do not take this medicine with any of the following medications: -certain migraine medicines like ergotamine and dihydroergotamine (DHE) -medicines used to treat erectile dysfunction like sildenafil, tadalafil, and vardenafil -riociguat This medicine may also interact with the following medications: -alteplase -aspirin -heparin -medicines for high blood pressure -medicines for mental depression -other medicines used to treat angina -phenothiazines like chlorpromazine, mesoridazine, prochlorperazine, thioridazine This list may not describe all possible interactions. Give your health care provider a list of all the medicines, herbs, non-prescription drugs, or dietary supplements you use. Also tell them if you smoke, drink alcohol, or use illegal drugs. Some items may interact with your medicine. What should I watch for while using this medicine? Tell your doctor or health care professional if you feel your medicine is no longer working. Keep this medicine with  you at all times. Sit or lie down when you take your medicine to prevent falling if you feel dizzy or faint after using it. Try to remain calm. This  will help you to feel better faster. If you feel dizzy, take several deep breaths and lie down with your feet propped up, or bend forward with your head resting between your knees. You may get drowsy or dizzy. Do not drive, use machinery, or do anything that needs mental alertness until you know how this drug affects you. Do not stand or sit up quickly, especially if you are an older patient. This reduces the risk of dizzy or fainting spells. Alcohol can make you more drowsy and dizzy. Avoid alcoholic drinks. Do not treat yourself for coughs, colds, or pain while you are taking this medicine without asking your doctor or health care professional for advice. Some ingredients may increase your blood pressure. What side effects may I notice from receiving this medicine? Side effects that you should report to your doctor or health care professional as soon as possible: -blurred vision -dry mouth -skin rash -sweating -the feeling of extreme pressure in the head -unusually weak or tired Side effects that usually do not require medical attention (report to your doctor or health care professional if they continue or are bothersome): -flushing of the face or neck -headache -irregular heartbeat, palpitations -nausea, vomiting This list may not describe all possible side effects. Call your doctor for medical advice about side effects. You may report side effects to FDA at 1-800-FDA-1088. Where should I keep my medicine? Keep out of the reach of children. Store at room temperature between 20 and 25 degrees C (68 and 77 degrees F). Store in [place]. Protect from light and moisture. Keep tightly closed. Throw away any unused medicine after the expiration date. NOTE: This sheet is a summary. It may not cover all possible information. If you have questions about this medicine, talk to your doctor, pharmacist, or health care provider.  2018 Elsevier/Gold Standard (2013-01-10 17:57:36)  Your  physician has requested that you have a lexiscan myoview. For further information please visit [employer]. Please follow instruction sheet, as given.

## 2016-10-07 ENCOUNTER — Encounter (HOSPITAL_COMMUNITY): Payer: Self-pay

## 2016-10-07 ENCOUNTER — Ambulatory Visit (HOSPITAL_BASED_OUTPATIENT_CLINIC_OR_DEPARTMENT_OTHER)
Admission: RE | Admit: 2016-10-07 | Discharge: 2016-10-07 | Disposition: A | Discharge status: Home / Self Care | Payer: Medicare HMO | Source: Ambulatory Visit | Attending: Cardiology | Admitting: Cardiology

## 2016-10-07 ENCOUNTER — Ambulatory Visit (HOSPITAL_COMMUNITY)
Admission: RE | Admit: 2016-10-07 | Discharge: 2016-10-07 | Disposition: A | Discharge status: Home / Self Care | Payer: Medicare HMO | Source: Ambulatory Visit | Attending: Cardiology | Admitting: Cardiology

## 2016-10-07 DIAGNOSIS — R079 Chest pain, unspecified: Secondary | ICD-10-CM

## 2016-10-07 HISTORY — DX: Disorder of kidney and ureter, unspecified: N28.9

## 2016-10-07 LAB — NM MYOCAR MULTI W/SPECT W/WALL MOTION / EF
CHL CUP NUCLEAR SDS: 0
CHL CUP RESTING HR STRESS: 57 {beats}/min
LHR: 0.48
LVDIAVOL: 112 mL (ref 62–150)
LVSYSVOL: 41 mL
NUC STRESS TID: 1.05
Peak HR: 69 {beats}/min
SRS: 2
SSS: 2

## 2016-10-07 MED ORDER — TECHNETIUM TC 99M TETROFOSMIN IV KIT
30.0000 | PACK | Freq: Once | INTRAVENOUS | Status: AC | PRN
  Administered 2016-10-07: 30 via INTRAVENOUS

## 2016-10-07 MED ORDER — TECHNETIUM TC 99M TETROFOSMIN IV KIT
10.0000 | PACK | Freq: Once | INTRAVENOUS | Status: AC | PRN
  Administered 2016-10-07: 11 via INTRAVENOUS

## 2016-10-07 MED ORDER — REGADENOSON 0.4 MG/5ML IV SOLN
INTRAVENOUS | Status: AC
  Administered 2016-10-07: 0.4 mg via INTRAVENOUS
  Filled 2016-10-07: qty 5

## 2016-10-07 MED ORDER — SODIUM CHLORIDE 0.9% FLUSH
INTRAVENOUS | Status: AC
  Administered 2016-10-07: 10 mL via INTRAVENOUS
  Filled 2016-10-07: qty 10

## 2016-10-12 ENCOUNTER — Telehealth: Payer: Self-pay

## 2016-10-12 NOTE — Telephone Encounter (Signed)
Patient notified. Routed to PCP 

## 2016-10-12 NOTE — Telephone Encounter (Signed)
-----   Message from Arnoldo Lenis, MD sent at 10/11/2016  1:01 PM EDT ----- Stress test looks good, no evidence of new blockages. We will discuss further at our f/u  Zandra Abts MD

## 2016-11-16 ENCOUNTER — Ambulatory Visit (INDEPENDENT_AMBULATORY_CARE_PROVIDER_SITE_OTHER): Payer: Medicare HMO | Admitting: Cardiology

## 2016-11-16 ENCOUNTER — Encounter: Payer: Self-pay | Admitting: Cardiology

## 2016-11-16 VITALS — BP 157/70 | HR 65 | Ht 70.0 in | Wt 222.0 lb

## 2016-11-16 DIAGNOSIS — R0602 Shortness of breath: Secondary | ICD-10-CM | POA: Diagnosis not present

## 2016-11-16 DIAGNOSIS — I2510 Atherosclerotic heart disease of native coronary artery without angina pectoris: Secondary | ICD-10-CM

## 2016-11-16 NOTE — Progress Notes (Signed)
Clinical Summary Mr. Costilla is a 83 y.o.male seen today for follow up of the following medical problems. This is a focused visit on his history of CAD and recent chest pain, for more detailed history please see prior notes  1. CAD - nonobstructive CAD by cath Jan 2012, LVEF 60-65% by LV gram.  - 06/2015 nuclear stress without clear ischemia - 05/2015 echo LVEF 65-70%, no WMAs, cannot evaluate diastolic function    - last visit we ordered a lexiscan for chest pain and SOB. 09/2016 lexiscan without ischemia, low risk study.  - still with SOB at times since last visit.        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      Past Medical History:  Diagnosis Date  . CHF (congestive heart failure) ([place])   . Coronary atherosclerosis of native coronary artery    Nonobstructive  . Essential hypertension, benign   . GERD (gastroesophageal reflux disease)   . HIT (heparin-induced thrombocytopenia) ([place])   . Mixed hyperlipidemia   . PSVT (paroxysmal supraventricular tachycardia) ([place])   . Renal insufficiency   . Type 2 diabetes mellitus (HCC)      Allergies  Allergen Reactions  . Codeine     REACTION: sob  . Heparin     REACTION: +HIT  . Oxycodone Other (See Comments)    "Made me act out of my mind"     Current Outpatient Prescriptions  Medication Sig Dispense Refill  . apixaban (ELIQUIS) 5 MG TABS tablet Take 1 tablet (5 mg total) by mouth 2 (two) times daily. 180 tablet 3  . CARTIA XT 240 MG 24 hr capsule TAKE 1 CAPSULE EVERY DAY  (DOSE  INCREASE) 90 capsule 3  . Cholecalciferol (D3-1000) 1000 units tablet Take 2,000 Units by mouth daily.    . Cyanocobalamin (B-12) 2500 MCG TABS Take 1 tablet by mouth daily.    . finasteride (PROSCAR) 5 MG tablet Take 1 tablet by mouth daily.    . furosemide (LASIX) 20 MG tablet Take 1 tablet (20 mg total) by mouth daily as needed. 90 tablet 3  . hydrochlorothiazide (HYDRODIURIL) 25 MG  tablet Take 1 tablet by mouth daily.    . insulin glargine (LANTUS) 100 UNIT/ML injection Inject 70 Units into the skin at bedtime.     . Magnesium 250 MG TABS Take 500 mg by mouth daily.    . metFORMIN (GLUCOPHAGE) 1000 MG tablet Take 1,000 mg by mouth 2 (two) times daily with a meal.      . metoprolol succinate (TOPROL XL) 100 MG 24 hr tablet Take 1 tablet (100 mg total) by mouth daily. 90 tablet 1  . nitroGLYCERIN (NITROSTAT) 0.4 MG SL tablet Place 1 tablet (0.4 mg total) under the tongue every 5 (five) minutes as needed for chest pain. 25 tablet 3  . Potassium 95 MG TABS Take 1 tablet by mouth daily.    . traZODone (DESYREL) 100 MG tablet Take 1 tablet by mouth at bedtime.     No current facility-administered medications for this visit.      Past Surgical History:  Procedure Laterality Date  . APPENDECTOMY       Allergies  Allergen Reactions  . Codeine     REACTION: sob  . Heparin     REACTION: +HIT  . Oxycodone Other (See Comments)    "Made me act out of my mind"      Family History  Problem Relation Age of Onset  . Heart attack Father 69     Social History Mr. Ferencz reports that he quit smoking about 33 years ago. His smoking use included Cigarettes. He started smoking about 63 years ago. He has a 60.00 pack-year smoking history. He has never used smokeless tobacco. Mr. Virgil reports that he drinks alcohol.   Review of Systems CONSTITUTIONAL: No weight loss, fever, chills, weakness or fatigue.  HEENT: Eyes: No visual loss, blurred vision, double vision or yellow sclerae.No hearing loss, sneezing, congestion, runny nose or sore throat.  SKIN: No rash or itching.  CARDIOVASCULAR: per hpi RESPIRATORY: per hpi  GASTROINTESTINAL: No anorexia, nausea, vomiting or diarrhea. No abdominal pain or blood.  GENITOURINARY: No burning on urination, no polyuria NEUROLOGICAL: No headache, dizziness, syncope, paralysis, ataxia, numbness or tingling in the extremities. No  change in bowel or bladder control.  MUSCULOSKELETAL: No muscle, back pain, joint pain or stiffness.  LYMPHATICS: No enlarged nodes. No history of splenectomy.  PSYCHIATRIC: No history of depression or anxiety.  ENDOCRINOLOGIC: No reports of sweating, cold or heat intolerance. No polyuria or polydipsia.  [name]   Physical Examination Vitals:   11/16/16 1449  BP: (!) 157/70  Pulse: 65  SpO2: 95%   Vitals:   11/16/16 1449  Weight: 222 lb (100.7 kg)  Height: 5\' 10"  (1.778 m)    Gen: resting comfortably, no acute distress HEENT: no scleral icterus, pupils equal round and reactive, no palptable cervical adenopathy,  CV: RRR, no m/r/,g no jvd Resp: Clear to auscultation bilaterally GI: abdomen is soft, non-tender, non-distended, normal bowel sounds, no hepatosplenomegaly MSK: extremities are warm, no edema.  Skin: warm, no rash Neuro:  no focal deficits Psych: appropriate affect   Diagnostic Studies 05/2015 echo Study Conclusions  - Left ventricle: The cavity size was normal. Wall thickness was increased increased in a pattern of mild to moderate LVH. Systolic function was vigorous. The estimated ejection fraction was in the range of 65% to 70%. Wall motion was normal; there were no regional wall motion abnormalities. The study is not technically sufficient to allow evaluation of LV diastolic function. - Aortic valve: Moderately calcified annulus. Mildly thickened leaflets. There was mild stenosis. There was mild regurgitation. Mean gradient (S): 13 mm Hg. Valve area (VTI): 1.51 cm^2. - Mitral valve: Mildly calcified annulus. Normal thickness leaflets . - Left atrium: The atrium was moderately dilated. - Technically adequate study.   06/2015 Nuclear stress test  No diagnostic ST segment changes to indicate ischemia.  Small, mild intensity, perfusion defects noted in the apical anterior and basal inferolateral walls. This is most consistent with soft  tissue attenuation given normal wall motion in these regions. No large ischemic zones noted.  This is a low risk study.  Nuclear stress EF: 64%.  05/2016 Event monitor  Rhythm is atrial fibrillation throughout study. Occasioanal PVCs  Min HR 51, Max HR 107, Avg HR 67  Reported symptoms correspond with rate controlled atrial fibrillation.    Assessment and Plan   1. CAD - nonobstructive disease by cath 2012 - recent negative stress test - we will continue current medical thearpy - recommended PFTs in setting of ongoing SOB, he wishes to think over at this time.        Arnoldo Lenis, M.D.

## 2016-11-16 NOTE — Patient Instructions (Signed)
Your physician wants you to follow-up in: [place] will receive a reminder letter in the mail two months in advance. If you don't receive a letter, please call our office to schedule the follow-up appointment.  Your physician recommends that you continue on your current medications as directed. Please refer to the Current Medication list given to you today.  PLEASE CALL us WHEN YOU ARE READY TO SCHEDULE PULMONARY FUNCTION TEST   Thank you for choosing [facility]!!

## 2016-12-22 ENCOUNTER — Other Ambulatory Visit: Payer: Self-pay | Admitting: Cardiology

## 2017-05-16 ENCOUNTER — Ambulatory Visit: Payer: Medicare HMO | Admitting: Cardiology

## 2017-05-16 ENCOUNTER — Encounter: Payer: Self-pay | Admitting: Cardiology

## 2017-05-16 ENCOUNTER — Telehealth: Payer: Self-pay | Admitting: Cardiology

## 2017-05-16 ENCOUNTER — Other Ambulatory Visit: Payer: Self-pay

## 2017-05-16 VITALS — BP 138/80 | HR 62 | Ht 67.0 in | Wt 221.0 lb

## 2017-05-16 DIAGNOSIS — I350 Nonrheumatic aortic (valve) stenosis: Secondary | ICD-10-CM | POA: Diagnosis not present

## 2017-05-16 DIAGNOSIS — I5032 Chronic diastolic (congestive) heart failure: Secondary | ICD-10-CM | POA: Diagnosis not present

## 2017-05-16 DIAGNOSIS — I4891 Unspecified atrial fibrillation: Secondary | ICD-10-CM

## 2017-05-16 DIAGNOSIS — R0602 Shortness of breath: Secondary | ICD-10-CM | POA: Diagnosis not present

## 2017-05-16 DIAGNOSIS — I2510 Atherosclerotic heart disease of native coronary artery without angina pectoris: Secondary | ICD-10-CM

## 2017-05-16 DIAGNOSIS — I471 Supraventricular tachycardia: Secondary | ICD-10-CM

## 2017-05-16 NOTE — Patient Instructions (Signed)
Your physician recommends that you schedule a follow-up appointment in: [place] recommends that you continue on your current medications as directed. Please refer to the Current Medication list given to you today.    [address] [place] [address] Dept: [phone] Loc: [phone]  AMEIR FARIA  05/16/2017  You are scheduled for a Cardiac Catheterization on Thursday, February 28 with Dr. Sherren Mocha.  1. Please arrive at the [facility] (Main Entrance A) at [facility]: [address] [place], [place] [address] at 8:00 AM (two hours before your procedure to ensure your preparation). Free valet parking service is available.   Special note: Every effort is made to have your procedure done on time. Please understand that emergencies sometimes delay scheduled procedures.  2. Diet: Do not eat or drink anything after midnight prior to your procedure except sips of water to take medications.  3. Labs: Your labs will be performed at the hospital after you arrive for your procedure.  4. Medication instructions in preparation for your procedure:  *For reference purposes while preparing patient instructions.   Delete this med list prior to printing instructions for patient.*  Stop taking Eliquis (Apixiban) on Tuesday, February 26.  STOP TAKING DIABETIC MEDICATIONS INCLUDING INSULIN 24 HOURS PRIOR TO PROCEDURE   On the morning of your procedure, take any morning medicines NOT listed above.  You may use sips of water.  5. Plan for one night stay--bring personal belongings. 6. Bring a current list of your medications and current insurance cards. 7. You MUST have a responsible person to drive you home. 8. Someone MUST be with you the first 24 hours after you arrive home or your discharge will be delayed. 9. Please wear clothes that  are easy to get on and off and wear slip-on shoes.  Thank you for allowing [place] to care for you!   -- [facility]

## 2017-05-16 NOTE — Progress Notes (Signed)
Clinical Summary Mike Wright is a 84 y.o.male seen today for follow up of the following medical problems.   1. CAD - nonobstructive CAD by cath Jan 2012, LVEF 60-65% by LV gram.  - 06/2015 nuclear stress without clear ischemia - 05/2015 echo LVEF 65-70%, no WMAs, cannot evaluate diastolic function 0/6301 lexiscan without ischemia, low risk study.    - has had some increase in chest pain since last visit and SOB - episode last night. Occurred at rest. Dull pressure left sided, 8-9/10 range. Some SOB.  Better with NG x2, pain improved after 15 minutes.  - since last visit has had some increase in frequency and severity in symptoms.   - pain occurs daily, increase in frequency.  - DOE room to room at home.   2. PSVT/Palpitations.  - no recent symptoms.    3. Afib -  7 day event monitor for palpitations that showed rate controlled afib - no recent symptoms  4. Chronic diastolic HF - admit to [facility] 07/2016 with acute on chronic systolic HF - no recent edema. Progressiong DOE   5. HTN - compliant with meds  6. Aortic stenosis - mild by echo 05/2015. 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  Past Medical History:  Diagnosis Date  . CHF (congestive heart failure) ([place])   . Coronary atherosclerosis of native coronary artery    Nonobstructive  . Essential hypertension, benign   . GERD (gastroesophageal reflux disease)   . HIT (heparin-induced thrombocytopenia) ([place])   . Mixed hyperlipidemia   . PSVT (paroxysmal supraventricular tachycardia) ([place])   . Renal insufficiency   . Type 2 diabetes mellitus (HCC)      Allergies  Allergen Reactions  . Codeine     REACTION: sob  . Heparin     REACTION: +HIT  . Oxycodone Other (See Comments)    "Made me act out of my mind"     Current Outpatient  Medications  Medication Sig Dispense Refill  . apixaban (ELIQUIS) 5 MG TABS tablet Take 1 tablet (5 mg total) by mouth 2 (two) times daily. 180 tablet 3  . CARTIA XT 240 MG 24 hr capsule TAKE 1 CAPSULE EVERY DAY  (DOSE  INCREASE) 90 capsule 3  . Cholecalciferol (D3-1000) 1000 units tablet Take 2,000 Units by mouth daily.    . Cyanocobalamin (B-12) 2500 MCG TABS Take 1 tablet by mouth daily.    . finasteride (PROSCAR) 5 MG tablet Take 1 tablet by mouth daily.    . furosemide (LASIX) 20 MG tablet Take 1 tablet (20 mg total) by mouth daily as needed. 90 tablet 3  . hydrochlorothiazide (HYDRODIURIL) 25 MG tablet Take 1 tablet by mouth daily.    . insulin glargine (LANTUS) 100 UNIT/ML injection Inject 70 Units into the skin at bedtime.     . Magnesium 250 MG TABS Take 500 mg by mouth daily.    . metFORMIN (GLUCOPHAGE) 1000 MG tablet Take 1,000 mg by mouth 2 (two) times daily with a meal.      . metoprolol succinate (TOPROL-XL) 100 MG 24 hr tablet TAKE 1 TABLET EVERY DAY 90 tablet 1  . nitroGLYCERIN (NITROSTAT) 0.4 MG SL tablet Place 1 tablet (0.4 mg total) under the tongue every 5 (five) minutes as needed for chest pain. 25  tablet 3  . Potassium 95 MG TABS Take 1 tablet by mouth daily.    . traZODone (DESYREL) 100 MG tablet Take 1 tablet by mouth at bedtime.     No current facility-administered medications for this visit.      Past Surgical History:  Procedure Laterality Date  . APPENDECTOMY       Allergies  Allergen Reactions  . Codeine     REACTION: sob  . Heparin     REACTION: +HIT  . Oxycodone Other (See Comments)    "Made me act out of my mind"      Family History  Problem Relation Age of Onset  . Heart attack Father 65     Social History Mike Wright reports that he quit smoking about 34 years ago. His smoking use included cigarettes. He started smoking about 64 years ago. He has a 60.00 pack-year smoking history. he has never used smokeless tobacco. Mike Wright reports  that he drinks alcohol.   Review of Systems CONSTITUTIONAL: No weight loss, fever, chills, weakness or fatigue.  HEENT: Eyes: No visual loss, blurred vision, double vision or yellow sclerae.No hearing loss, sneezing, congestion, runny nose or sore throat.  SKIN: No rash or itching.  CARDIOVASCULAR: per hpi RESPIRATORY:per hpi GASTROINTESTINAL: No anorexia, nausea, vomiting or diarrhea. No abdominal pain or blood.  GENITOURINARY: No burning on urination, no polyuria NEUROLOGICAL: No headache, dizziness, syncope, paralysis, ataxia, numbness or tingling in the extremities. No change in bowel or bladder control.  MUSCULOSKELETAL: No muscle, back pain, joint pain or stiffness.  LYMPHATICS: No enlarged nodes. No history of splenectomy.  PSYCHIATRIC: No history of depression or anxiety.  ENDOCRINOLOGIC: No reports of sweating, cold or heat intolerance. No polyuria or polydipsia.  [name]   Physical Examination Vitals:   05/16/17 1409  BP: 138/80  Pulse: 62  SpO2: 95%   Vitals:   05/16/17 1409  Weight: 221 lb (100.2 kg)  Height: 5\' 7"  (1.702 m)    Gen: resting comfortably, no acute distress HEENT: no scleral icterus, pupils equal round and reactive, no palptable cervical adenopathy,  CV: RRR, 2/6 systolic murmur rusb, no jvd Resp: Clear to auscultation bilaterally GI: abdomen is soft, non-tender, non-distended, normal bowel sounds, no hepatosplenomegaly MSK: extremities are warm, no edema.  Skin: warm, no rash Neuro:  no focal deficits Psych: appropriate affect   Diagnostic Studies 05/2015 echo Study Conclusions  - Left ventricle: The cavity size was normal. Wall thickness was increased increased in a pattern of mild to moderate LVH. Systolic function was vigorous. The estimated ejection fraction was in the range of 65% to 70%. Wall motion was normal; there were no regional wall motion abnormalities. The study is not technically sufficient to allow evaluation of LV  diastolic function. - Aortic valve: Moderately calcified annulus. Mildly thickened leaflets. There was mild stenosis. There was mild regurgitation. Mean gradient (S): 13 mm Hg. Valve area (VTI): 1.51 cm^2. - Mitral valve: Mildly calcified annulus. Normal thickness leaflets . - Left atrium: The atrium was moderately dilated. - Technically adequate study.   06/2015 Nuclear stress test  No diagnostic ST segment changes to indicate ischemia.  Small, mild intensity, perfusion defects noted in the apical anterior and basal inferolateral walls. This is most consistent with soft tissue attenuation given normal wall motion in these regions. No large ischemic zones noted.  This is a low risk study.  Nuclear stress EF: 64%.  05/2016 Event monitor  Rhythm is atrial fibrillation throughout study. Occasioanal PVCs  Min HR 51, Max HR 107, Avg HR 67  Reported symptoms correspond with rate controlled atrial fibrillation.  09/2016 nuclear stress  There was no ST segment deviation noted during stress.  The study is normal. There are no perfusion defects consistent with prior infarct or current ischemia.  This is a low risk study.  The left ventricular ejection fraction is normal (55-65%).  Assessment and Plan  1. CAD - nonobstructive disease by cath 2012 - recent negative stress test 09/2016 - progressing chest pain and SOB/DOE. We will plan for LHC/RHC to definitively evaluate his cardiac status.    2. PSVT/Palpitations.  - monitor shows rate controlled afib - no recent symptoms, continue current meds   3. Afib - no symptoms, continue current meds - continue anticoaglation.CHADS2Vasc score is 3,  - hold eliquis 2 days prior to cath.   4. Aortic stenosis - mild by last echo, continue to moniotor.   5. Chronic diastolic HF - difficult to assess volume status by exam. Unclear if his DOE is related - plan for RHC   I have reviewed the risks, indications, and  alternatives to cardiac catheterization, possible angioplasty, and stenting with the patient today. Risks include but are not limited to bleeding, infection, vascular injury, stroke, myocardial infection, arrhythmia, kidney injury, radiation-related injury in the case of prolonged fluoroscopy use, emergency cardiac surgery, and death. The patient understands the risks of serious complication is 1-2 in 0940 with diagnostic cardiac cath and 1-2% or less with angioplasty/stenting.    F/u 6 weeks Arnoldo Lenis, M.D.

## 2017-05-16 NOTE — Telephone Encounter (Signed)
Pre-cert Verification for the following procedure   L/R [facility] 2/28 @10 :30AM DR Burt Knack

## 2017-05-17 ENCOUNTER — Telehealth: Payer: Self-pay | Admitting: *Deleted

## 2017-05-17 ENCOUNTER — Inpatient Hospital Stay (HOSPITAL_COMMUNITY)
Admission: EM | Admit: 2017-05-17 | Discharge: 2017-05-20 | DRG: 246 | Disposition: A | Discharge status: Home / Self Care | Payer: Medicare HMO | Attending: Cardiology | Admitting: Cardiology

## 2017-05-17 ENCOUNTER — Encounter (HOSPITAL_COMMUNITY): Payer: Self-pay | Admitting: Emergency Medicine

## 2017-05-17 ENCOUNTER — Other Ambulatory Visit: Payer: Self-pay | Admitting: Cardiology

## 2017-05-17 ENCOUNTER — Emergency Department (HOSPITAL_COMMUNITY): Payer: Medicare HMO

## 2017-05-17 ENCOUNTER — Encounter: Payer: Self-pay | Admitting: Cardiology

## 2017-05-17 DIAGNOSIS — G4733 Obstructive sleep apnea (adult) (pediatric): Secondary | ICD-10-CM | POA: Diagnosis present

## 2017-05-17 DIAGNOSIS — Z79899 Other long term (current) drug therapy: Secondary | ICD-10-CM | POA: Diagnosis not present

## 2017-05-17 DIAGNOSIS — I200 Unstable angina: Secondary | ICD-10-CM | POA: Diagnosis not present

## 2017-05-17 DIAGNOSIS — I110 Hypertensive heart disease with heart failure: Secondary | ICD-10-CM | POA: Diagnosis present

## 2017-05-17 DIAGNOSIS — K219 Gastro-esophageal reflux disease without esophagitis: Secondary | ICD-10-CM | POA: Diagnosis present

## 2017-05-17 DIAGNOSIS — R079 Chest pain, unspecified: Secondary | ICD-10-CM

## 2017-05-17 DIAGNOSIS — Z7901 Long term (current) use of anticoagulants: Secondary | ICD-10-CM

## 2017-05-17 DIAGNOSIS — Z87891 Personal history of nicotine dependence: Secondary | ICD-10-CM

## 2017-05-17 DIAGNOSIS — Z794 Long term (current) use of insulin: Secondary | ICD-10-CM | POA: Diagnosis not present

## 2017-05-17 DIAGNOSIS — G4730 Sleep apnea, unspecified: Secondary | ICD-10-CM | POA: Diagnosis not present

## 2017-05-17 DIAGNOSIS — I493 Ventricular premature depolarization: Secondary | ICD-10-CM | POA: Diagnosis present

## 2017-05-17 DIAGNOSIS — R0902 Hypoxemia: Secondary | ICD-10-CM

## 2017-05-17 DIAGNOSIS — D649 Anemia, unspecified: Secondary | ICD-10-CM | POA: Diagnosis present

## 2017-05-17 DIAGNOSIS — R062 Wheezing: Secondary | ICD-10-CM | POA: Diagnosis present

## 2017-05-17 DIAGNOSIS — R0609 Other forms of dyspnea: Secondary | ICD-10-CM | POA: Diagnosis not present

## 2017-05-17 DIAGNOSIS — I471 Supraventricular tachycardia, unspecified: Secondary | ICD-10-CM | POA: Diagnosis present

## 2017-05-17 DIAGNOSIS — I5033 Acute on chronic diastolic (congestive) heart failure: Secondary | ICD-10-CM | POA: Diagnosis present

## 2017-05-17 DIAGNOSIS — E782 Mixed hyperlipidemia: Secondary | ICD-10-CM | POA: Diagnosis present

## 2017-05-17 DIAGNOSIS — J9601 Acute respiratory failure with hypoxia: Secondary | ICD-10-CM | POA: Diagnosis not present

## 2017-05-17 DIAGNOSIS — Z9861 Coronary angioplasty status: Secondary | ICD-10-CM

## 2017-05-17 DIAGNOSIS — N289 Disorder of kidney and ureter, unspecified: Secondary | ICD-10-CM | POA: Diagnosis present

## 2017-05-17 DIAGNOSIS — I5032 Chronic diastolic (congestive) heart failure: Secondary | ICD-10-CM | POA: Diagnosis present

## 2017-05-17 DIAGNOSIS — I480 Paroxysmal atrial fibrillation: Secondary | ICD-10-CM | POA: Diagnosis present

## 2017-05-17 DIAGNOSIS — I350 Nonrheumatic aortic (valve) stenosis: Secondary | ICD-10-CM | POA: Diagnosis present

## 2017-05-17 DIAGNOSIS — Z885 Allergy status to narcotic agent status: Secondary | ICD-10-CM

## 2017-05-17 DIAGNOSIS — J441 Chronic obstructive pulmonary disease with (acute) exacerbation: Secondary | ICD-10-CM | POA: Diagnosis present

## 2017-05-17 DIAGNOSIS — I2510 Atherosclerotic heart disease of native coronary artery without angina pectoris: Secondary | ICD-10-CM | POA: Diagnosis not present

## 2017-05-17 DIAGNOSIS — I25110 Atherosclerotic heart disease of native coronary artery with unstable angina pectoris: Principal | ICD-10-CM | POA: Diagnosis present

## 2017-05-17 DIAGNOSIS — D7582 Heparin induced thrombocytopenia (HIT): Secondary | ICD-10-CM | POA: Diagnosis present

## 2017-05-17 DIAGNOSIS — D500 Iron deficiency anemia secondary to blood loss (chronic): Secondary | ICD-10-CM | POA: Diagnosis present

## 2017-05-17 DIAGNOSIS — Z6834 Body mass index (BMI) 34.0-34.9, adult: Secondary | ICD-10-CM | POA: Diagnosis not present

## 2017-05-17 DIAGNOSIS — Z981 Arthrodesis status: Secondary | ICD-10-CM

## 2017-05-17 DIAGNOSIS — E119 Type 2 diabetes mellitus without complications: Secondary | ICD-10-CM | POA: Diagnosis present

## 2017-05-17 DIAGNOSIS — R001 Bradycardia, unspecified: Secondary | ICD-10-CM | POA: Diagnosis present

## 2017-05-17 DIAGNOSIS — E669 Obesity, unspecified: Secondary | ICD-10-CM | POA: Diagnosis present

## 2017-05-17 DIAGNOSIS — R0789 Other chest pain: Secondary | ICD-10-CM

## 2017-05-17 DIAGNOSIS — Z8249 Family history of ischemic heart disease and other diseases of the circulatory system: Secondary | ICD-10-CM

## 2017-05-17 DIAGNOSIS — Z955 Presence of coronary angioplasty implant and graft: Secondary | ICD-10-CM

## 2017-05-17 DIAGNOSIS — J449 Chronic obstructive pulmonary disease, unspecified: Secondary | ICD-10-CM

## 2017-05-17 DIAGNOSIS — Z96653 Presence of artificial knee joint, bilateral: Secondary | ICD-10-CM | POA: Diagnosis present

## 2017-05-17 HISTORY — DX: Hypoxemia: R09.02

## 2017-05-17 HISTORY — DX: Sleep apnea, unspecified: G47.30

## 2017-05-17 HISTORY — DX: Personal history of other diseases of the digestive system: Z87.19

## 2017-05-17 HISTORY — DX: Chronic diastolic (congestive) heart failure: I50.32

## 2017-05-17 HISTORY — DX: Paroxysmal atrial fibrillation: I48.0

## 2017-05-17 HISTORY — DX: Wheezing: R06.2

## 2017-05-17 HISTORY — DX: Atherosclerotic heart disease of native coronary artery without angina pectoris: I25.10

## 2017-05-17 HISTORY — DX: Bradycardia, unspecified: R00.1

## 2017-05-17 HISTORY — DX: Anemia, unspecified: D64.9

## 2017-05-17 LAB — BRAIN NATRIURETIC PEPTIDE: B NATRIURETIC PEPTIDE 5: 318.5 pg/mL — AB (ref 0.0–100.0)

## 2017-05-17 LAB — CBC WITH DIFFERENTIAL/PLATELET
BASOS ABS: 0 10*3/uL (ref 0.0–0.1)
Basophils Relative: 0 %
Eosinophils Absolute: 0.1 10*3/uL (ref 0.0–0.7)
Eosinophils Relative: 1 %
HEMATOCRIT: 40.8 % (ref 39.0–52.0)
HEMOGLOBIN: 13.3 g/dL (ref 13.0–17.0)
LYMPHS PCT: 25 %
Lymphs Abs: 2 10*3/uL (ref 0.7–4.0)
MCH: 28.6 pg (ref 26.0–34.0)
MCHC: 32.6 g/dL (ref 30.0–36.0)
MCV: 87.7 fL (ref 78.0–100.0)
Monocytes Absolute: 1.2 10*3/uL — ABNORMAL HIGH (ref 0.1–1.0)
Monocytes Relative: 15 %
NEUTROS ABS: 4.7 10*3/uL (ref 1.7–7.7)
Neutrophils Relative %: 59 %
Platelets: 179 10*3/uL (ref 150–400)
RBC: 4.65 MIL/uL (ref 4.22–5.81)
RDW: 15.8 % — ABNORMAL HIGH (ref 11.5–15.5)
WBC: 8 10*3/uL (ref 4.0–10.5)

## 2017-05-17 LAB — I-STAT TROPONIN, ED: Troponin i, poc: 0 ng/mL (ref 0.00–0.08)

## 2017-05-17 LAB — BASIC METABOLIC PANEL
ANION GAP: 13 (ref 5–15)
BUN: 14 mg/dL (ref 6–20)
CHLORIDE: 101 mmol/L (ref 101–111)
CO2: 26 mmol/L (ref 22–32)
Calcium: 9.4 mg/dL (ref 8.9–10.3)
Creatinine, Ser: 0.94 mg/dL (ref 0.61–1.24)
GFR calc Af Amer: >60 mL/min (ref >60)
Glucose, Bld: 117 mg/dL — ABNORMAL HIGH (ref 65–99)
POTASSIUM: 4.3 mmol/L (ref 3.5–5.1)
SODIUM: 140 mmol/L (ref 135–145)

## 2017-05-17 LAB — TROPONIN I

## 2017-05-17 LAB — CBG MONITORING, ED: Glucose-Capillary: 137 mg/dL — ABNORMAL HIGH (ref 65–99)

## 2017-05-17 MED ORDER — MAGNESIUM 250 MG PO TABS: 250.0000 mg | ORAL_TABLET | Freq: Every day | ORAL | Status: DC

## 2017-05-17 MED ORDER — CHOLECALCIFEROL 25 MCG (1000 UT) PO CAPS
2000.0000 [IU] | ORAL_CAPSULE | Freq: Every day | ORAL | Status: DC
  Filled 2017-05-17 (×2): qty 2

## 2017-05-17 MED ORDER — ATORVASTATIN CALCIUM 80 MG PO TABS
80.0000 mg | ORAL_TABLET | Freq: Every day | ORAL | Status: DC
  Administered 2017-05-17 – 2017-05-19 (×3): 80 mg via ORAL
  Filled 2017-05-17 (×3): qty 1

## 2017-05-17 MED ORDER — ASPIRIN EC 81 MG PO TBEC
81.0000 mg | DELAYED_RELEASE_TABLET | Freq: Every day | ORAL | Status: DC
  Administered 2017-05-18 – 2017-05-20 (×3): 81 mg via ORAL
  Filled 2017-05-17 (×3): qty 1

## 2017-05-17 MED ORDER — NITROGLYCERIN 0.4 MG SL SUBL: 0.4000 mg | SUBLINGUAL_TABLET | SUBLINGUAL | Status: DC | PRN

## 2017-05-17 MED ORDER — NITROGLYCERIN 0.4 MG SL SUBL
0.4000 mg | SUBLINGUAL_TABLET | Freq: Once | SUBLINGUAL | Status: AC
  Administered 2017-05-17: 0.4 mg via SUBLINGUAL
  Filled 2017-05-17: qty 1

## 2017-05-17 MED ORDER — INSULIN ASPART 100 UNIT/ML ~~LOC~~ SOLN
0.0000 [IU] | Freq: Three times a day (TID) | SUBCUTANEOUS | Status: DC
  Administered 2017-05-19 (×2): 3 [IU] via SUBCUTANEOUS

## 2017-05-17 MED ORDER — VITAMIN B-12 1000 MCG PO TABS
2500.0000 ug | ORAL_TABLET | Freq: Every day | ORAL | Status: DC
  Administered 2017-05-18 – 2017-05-20 (×3): 2500 ug via ORAL
  Filled 2017-05-17 (×3): qty 3

## 2017-05-17 MED ORDER — ONDANSETRON HCL 4 MG/2ML IJ SOLN: 4.0000 mg | Freq: Four times a day (QID) | INTRAMUSCULAR | Status: DC | PRN

## 2017-05-17 MED ORDER — ACETAMINOPHEN 325 MG PO TABS: 650.0000 mg | ORAL_TABLET | ORAL | Status: DC | PRN

## 2017-05-17 MED ORDER — FINASTERIDE 5 MG PO TABS
5.0000 mg | ORAL_TABLET | Freq: Every day | ORAL | Status: DC
  Administered 2017-05-19 – 2017-05-20 (×2): 5 mg via ORAL
  Filled 2017-05-17 (×3): qty 1

## 2017-05-17 MED ORDER — IPRATROPIUM-ALBUTEROL 0.5-2.5 (3) MG/3ML IN SOLN
3.0000 mL | Freq: Once | RESPIRATORY_TRACT | Status: AC
Start: 2017-05-17 — End: 2017-05-17
  Administered 2017-05-17: 3 mL via RESPIRATORY_TRACT
  Filled 2017-05-17: qty 3

## 2017-05-17 MED ORDER — METOPROLOL SUCCINATE ER 50 MG PO TB24
100.0000 mg | ORAL_TABLET | Freq: Every day | ORAL | Status: DC
  Administered 2017-05-18: 100 mg via ORAL
  Administered 2017-05-19: 50 mg via ORAL
  Filled 2017-05-17: qty 1
  Filled 2017-05-17: qty 2

## 2017-05-17 MED ORDER — MECLIZINE HCL 12.5 MG PO TABS
12.5000 mg | ORAL_TABLET | Freq: Three times a day (TID) | ORAL | Status: DC | PRN
  Filled 2017-05-17: qty 1

## 2017-05-17 MED ORDER — NITROGLYCERIN 2 % TD OINT
1.0000 [in_us] | TOPICAL_OINTMENT | Freq: Four times a day (QID) | TRANSDERMAL | Status: DC
  Administered 2017-05-17 – 2017-05-19 (×5): 1 [in_us] via TOPICAL
  Filled 2017-05-17 (×2): qty 1
  Filled 2017-05-17: qty 30
  Filled 2017-05-17: qty 1

## 2017-05-17 MED ORDER — MAGNESIUM OXIDE 400 (241.3 MG) MG PO TABS
200.0000 mg | ORAL_TABLET | Freq: Every day | ORAL | Status: DC
  Administered 2017-05-18 – 2017-05-20 (×3): 200 mg via ORAL
  Filled 2017-05-17 (×3): qty 1

## 2017-05-17 MED ORDER — TRAZODONE HCL 50 MG PO TABS
100.0000 mg | ORAL_TABLET | Freq: Every day | ORAL | Status: DC
  Administered 2017-05-17 – 2017-05-19 (×3): 100 mg via ORAL
  Filled 2017-05-17: qty 1
  Filled 2017-05-17 (×2): qty 2

## 2017-05-17 NOTE — ED Notes (Signed)
No pain

## 2017-05-17 NOTE — ED Notes (Signed)
Cards has seen and he is staying   Food ordered

## 2017-05-17 NOTE — H&P (Addendum)
Cardiology Admission History and Physical:   Patient ID: Mike Wright; MRN: [id number]; DOB: [birth year]   Admission date: 05/17/2017  Primary Care Provider: Deloria Lair., MD Primary Cardiologist: Dr. [name]   Chief Complaint:  Chest pain and SOB  Patient Profile:   Mike Wright is a 84 y.o. male with a history of non obstructive CAD, PAF on eliquis, PSVT/palpitations, chronic diastolic CHF, HTN, aortic stenosis, renal insufficiency and DM presented for chest pain sob.   - nonobstructive CAD by cath Jan 2012, LVEF 60-65% by LV gram.  - 06/2015 nuclear stress without clear ischemia - 05/2015 echo LVEF 65-70%, no WMAs, cannot evaluate diastolic function - 08/7670 Echo @ [facility] LVEF of 65%, elevated LVEDP - 09/2016 lexiscan without ischemia, low risk study.  Last seen by Dr. [name] 05/16/17. He is schedule of L & R cath next week for persistent worsening of chest pain and SOB despite increase in medical therapy.   History of Present Illness:   Mr. Fife was at Dr. Elta Guadeloupe Roy's office today and did not looked good and send to ER. He had substernal chest pressure yesterday with sitting resolved with SL nitro x 2. The morning he did not felt good. Fatigue and tired. He drove by himself here. He again had chest pressure in ER. Resolved after SL nitro x1.   He has severe DOE. He can only walks few steps at time. Has orthopnea and PND. No dizziness, syncope or palpitations.   Last dose of Elqiuis AM of 2/20. Currently chest pain free. BNP 318. CXR without active disease. Breathing improved after nebulizer tx.   He had mild OSA by study last year. During sleep study his HR dropped to 30s.    Past Medical History:  Diagnosis Date  . CHF (congestive heart failure) ([place])   . Coronary atherosclerosis of native coronary artery    Nonobstructive  . Essential hypertension, benign   . GERD (gastroesophageal reflux disease)   . HIT (heparin-induced thrombocytopenia) ([place])   . Mixed  hyperlipidemia   . PSVT (paroxysmal supraventricular tachycardia) ([place])   . Renal insufficiency   . Type 2 diabetes mellitus ([place])     Past Surgical History:  Procedure Laterality Date  . APPENDECTOMY       Medications Prior to Admission: Prior to Admission medications   Medication Sig Start Date End Date Taking? Authorizing Provider  apixaban (ELIQUIS) 5 MG TABS tablet Take 1 tablet (5 mg total) by mouth 2 (two) times daily. 11/23/15  Yes Branch, Alphonse Guild, MD  CARTIA XT 240 MG 24 hr capsule TAKE 1 CAPSULE EVERY DAY  (DOSE  INCREASE) Patient taking differently: Take 240 mg by mouth once a day 06/10/16  Yes Branch, Alphonse Guild, MD  Cholecalciferol (D3-1000 PO) Take 2,000 Units by mouth daily.   Yes [provider]  Cyanocobalamin (B-12) 2500 MCG TABS Take 2,500 mcg by mouth daily.    Yes [provider]  finasteride (PROSCAR) 5 MG tablet Take 5 mg by mouth daily.  04/17/15  Yes [provider]  furosemide (LASIX) 20 MG tablet Take 20 mg by mouth daily as needed for fluid.    Yes [provider]  hydrochlorothiazide (HYDRODIURIL) 25 MG tablet Take 25 mg by mouth daily.  04/22/15  Yes [provider]  insulin glargine (LANTUS) 100 UNIT/ML injection Inject 30-40 Units into the skin See admin instructions. Inject 40 units SQ in the morning and inject 30 units SQ at bedtime   Yes [provider]  Magnesium 250 MG TABS Take 250 mg by mouth daily.    Yes [provider]  meclizine (ANTIVERT) 12.5 MG tablet Take 12.5 mg by mouth 3 (three) times daily as needed for dizziness.   Yes [provider]  metFORMIN (GLUCOPHAGE) 1000 MG tablet Take 1,000 mg by mouth 2 (two) times daily with a meal.     Yes [provider]  metoprolol succinate (TOPROL-XL) 100 MG 24 hr tablet TAKE 1 TABLET EVERY DAY Patient taking differently: Take 100 mg by mouth once a day 12/22/16  Yes Branch, Alphonse Guild, MD  nitroGLYCERIN (NITROSTAT) 0.4 MG SL  tablet Place 0.4 mg under the tongue every 5 (five) minutes as needed for chest pain.   Yes [provider]  Potassium 95 MG TABS Take 95 mg by mouth daily.    Yes [provider]  traZODone (DESYREL) 100 MG tablet Take 100 mg by mouth at bedtime.  03/25/16  Yes [provider]     Allergies:    Allergies  Allergen Reactions  . Codeine Shortness Of Breath  . Heparin Other (See Comments)    +HIT,  Severe bleeding   . Oxycodone Other (See Comments)    "Made me act out of my mind"    Social History:   Social History   Socioeconomic History  . Marital status: Single    Spouse name: Not on file  . Number of children: Not on file  . Years of education: Not on file  . Highest education level: Not on file  Social Needs  . Financial resource strain: Not on file  . Food insecurity - worry: Not on file  . Food insecurity - inability: Not on file  . Transportation needs - medical: Not on file  . Transportation needs - non-medical: Not on file  Occupational History  . Not on file  Tobacco Use  . Smoking status: Former Smoker    Packs/day: 2.00    Years: 30.00    Pack years: 60.00    Types: Cigarettes    Start date: 03/28/1953    Last attempt to quit: 03/29/1983    Years since quitting: 34.1  . Smokeless tobacco: Never Used  Substance and Sexual Activity  . Alcohol use: Yes    Alcohol/week: 0.0 oz    Comment: Occasional  . Drug use: No  . Sexual activity: Not on file  Other Topics Concern  . Not on file  Social History Narrative  . Not on file    Family History:   The patient's family history includes Heart attack (age of onset: 65) in his father.    ROS:  Please see the history of present illness. All other ROS reviewed and negative.     Physical Exam/Data:   Vitals:   05/17/17 1600 05/17/17 1615 05/17/17 1645 05/17/17 1715  BP: (!) 123/100 118/64 (!) 103/43 (!) 118/96  Pulse: (!) 43  (!) 56 (!) 48  Resp: (!) 24 (!) 23 18 (!) 24  Temp:        TempSrc:      SpO2: 97% 98% 98% 98%  Weight:      Height:       No intake or output data in the 24 hours ending 05/17/17 1810 Filed Weights   05/17/17 1534  Weight: 221 lb (100.2 kg)   Body mass index is 34.61 kg/m.  General:  Well nourished, well developed, in no acute distress HEENT: normal Lymph: no adenopathy Neck: no  JVD Endocrine:  No thryomegaly Vascular: No carotid bruits; FA pulses 2+ bilaterally without bruits  Cardiac:  normal S1, S2; RRR; + systolic murmur  Lungs: Diminished breath sound at base no wheezing, rhonchi or rales  Abd: soft, nontender, no hepatomegaly  Ext: no  edema Musculoskeletal:  No deformities, BUE and BLE strength normal and equal Skin: warm and dry  Neuro:  CNs 2-12 intact, no focal abnormalities noted Psych:  Normal affect    EKG:  The ECG that was done  was personally reviewed and demonstrates: sinus bradycardia at rate of 54 bpm  Telemetry: sinus bradycardia at 40s intermittently goes to high 30s - personally reviewed  Relevant CV Studies:   Echo 05/2015 - Left ventricle: The cavity size was normal. Wall thickness was   increased increased in a pattern of mild to moderate LVH.   Systolic function was vigorous. The estimated ejection fraction   was in the range of 65% to 70%. Wall motion was normal; there   were no regional wall motion abnormalities. The study is not   technically sufficient to allow evaluation of LV diastolic   function. - Aortic valve: Moderately calcified annulus. Mildly thickened   leaflets. There was mild stenosis. There was mild regurgitation.   Mean gradient (S): 13 mm Hg. Valve area (VTI): 1.51 cm^2. - Mitral valve: Mildly calcified annulus. Normal thickness leaflets   . - Left atrium: The atrium was moderately dilated. - Technically adequate study.  Laboratory Data:  Chemistry Recent Labs  Lab 05/17/17 1532  NA 140  K 4.3  CL 101  CO2 26  GLUCOSE 117*  BUN 14  CREATININE 0.94  CALCIUM 9.4   GFRNONAA >60  GFRAA >60  ANIONGAP 13    Hematology Recent Labs  Lab 05/17/17 1532  WBC 8.0  RBC 4.65  HGB 13.3  HCT 40.8  MCV 87.7  MCH 28.6  MCHC 32.6  RDW 15.8*  PLT 179    Recent Labs  Lab 05/17/17 1546  TROPIPOC 0.00    BNP Recent Labs  Lab 05/17/17 1530  BNP 318.5*    Radiology/Studies:  Dg Chest Portable 1 View  Result Date: 05/17/2017 CLINICAL DATA:  Chest pain EXAM: PORTABLE CHEST 1 VIEW COMPARISON:  08/07/2016 FINDINGS: The heart size and mediastinal contours are within normal limits. Both lungs are clear. The visualized skeletal structures are unremarkable. IMPRESSION: No active disease. Electronically Signed   By: Inez Catalina M.D.   On: 05/17/2017 16:08   Assessment and Plan:   1. Unstable angina - Worsening pain. Resolved with SL nitro but reoccurs.  Admit and cycle troponin. Troponin negative. EKG without acute changes.  - Start ASA,  statin and nitro paste. Will plan to start IV heparin if enzyme positive. Hold Eliquis, resume post cath. Check lipid profile in AM.   2. PAF - sinus bradycardiac here in 30-40s. Will hold Cartia XT 240. Continue Toprol XL 100mg . Watch for tachbradycardia. Last dose of Eliquis AM of 2/20. Hold for cath.   3. DOE with chronic diastolic CHF - BNP 371. He takes lasix and HCTZ at home. Plan RHC.   4. Mild OSA - did not choose to use CPAP. Had rate of 30s during sleep study.   5. DM - start SSI  6. Mild AS by echo  Severity of Illness: The appropriate patient status for this patient is INPATIENT. Inpatient status is judged to be reasonable and necessary in order to provide the required intensity of service to ensure the  patient's safety. The patient's presenting symptoms, physical exam findings, and initial radiographic and laboratory data in the context of their chronic comorbidities is felt to place them at high risk for further clinical deterioration. Furthermore, it is not anticipated that the patient will be  medically stable for discharge from the hospital within 2 midnights of admission. The following factors support the patient status of inpatient.   " The patient's presenting symptoms include. Chest pressure and DOE. " The worrisome physical exam findings include diminished breath sound at base " The initial radiographic and laboratory data are worrisome because of non " The chronic co-morbidities include Obesity, PAF   * I certify that at the point of admission it is my clinical judgment that the patient will require inpatient hospital care spanning beyond 2 midnights from the point of admission due to high intensity of service, high risk for further deterioration and high frequency of surveillance required.*    For questions or updates, please contact [facility] Please consult [email or web address] for contact info under Cardiology/STEMI.    [place] Longest [place], [place]  05/17/2017 6:10 PM    Attending note:  Patient seen and examined.  Records reviewed and case discussed with Mr. Matt Holmes. Mr. Warriner has a history of nonobstructive CAD based on previous cardiac catheterization in 2012, low risk follow-up Myoview in July 2018, mild aortic stenosis, and paroxysmal atrial fibrillation.  He presents reporting progressive dyspnea on exertion over the last few months, worse in the last week.  He was just seen by Dr. [name] for an office visit on February 19 with plan for left and right heart catheterization scheduled next week.  He was sent to the [facility] ER after being evaluated by Dr. Carloyn Manner today for an unrelated issue, reportedly more short of breath and "did not look right."  He has been having angina symptoms as well and took 2 nitroglycerin last evening after making up his bed.  He has had no palpitations or syncope.   On examination he appears comfortable.  Heart rate has been in the 40s-50s in sinus rhythm, systolic blood pressure 161-096 range.  Lungs exhibit clear breath sounds without  wheezing.  Cardiac exam reveals RRR with soft systolic murmur no gallop.  Lab work shows potassium 4.3, BUN 14, creatinine 0.94, BNP 318, troponin I point-of-care negative, hemoglobin 13.3, platelets 179.  Chest x-ray is without acute findings.  I personally reviewed his ECG which shows a sinus bradycardia with nonspecific ST changes and PVCs.  Patient presents with progressive dyspnea on exertion and unstable angina symptoms.  He will be hospitalized for further evaluation, continue to cycle cardiac markers, hold Eliquis anticipating left and right heart catheterization as early as late tomorrow.  Given bradycardia with hold Cardizem, continue Toprol-XL but follow telemetry for further adjustments.  Hold diuretics tomorrow.  Satira Sark, M.D., F.A.C.C.

## 2017-05-17 NOTE — Telephone Encounter (Signed)
Received call from Dr. Glenna Fellows - stated patient was in his office now, having a lot of sob, give out with just walking into office from car which is a short distance.  Per Dr. Carloyn Manner - patient did take 2 Nitro tabs last pm for chest pain.  Informed Dr. Carloyn Manner - patient already has heart cath scheduled for 05/25/2017.  In light of all these symptoms - would advise him to go ahead to [facility] ED now for evaluation, may need to do cath sooner.  Patient verbalized understanding & has transportation to [employer].

## 2017-05-17 NOTE — ED Notes (Signed)
Another doctor aT THE BEDSIDE AT PRESENT  Unknown name

## 2017-05-17 NOTE — ED Triage Notes (Addendum)
Pt to ED with c/o mid to left chest pain and shortness of breath.  Pt st's he is scheduled for a heart cath next week.

## 2017-05-17 NOTE — ED Provider Notes (Signed)
[place] EMERGENCY DEPARTMENT Provider Note   CSN: [id number] Arrival date & time: 05/17/17  1510     History   Chief Complaint Chief Complaint  Patient presents with  . Chest Pain  . Shortness of Breath    HPI Mike Wright is a 84 y.o. male.  The history is provided by the patient and medical records. No language interpreter was used.  Chest Pain   Associated symptoms include cough and shortness of breath. Pertinent negatives include no palpitations.  Shortness of Breath  Associated symptoms include cough and chest pain. Pertinent negatives include no leg swelling.   Mike Wright is a 84 y.o. male  with a PMH of CAD, PSVT, HLD, CHF who presents to the Emergency Department complaining of chest pain.  Patient reports he had chest pain last night. He took two nitro with resolution of his pain. He went to a scheduled neurosurgery appointment today and had shortness of breath as well as chest pain with exertion.  Neurosurgeon recommended that he go to ER or call his cardiologist.  Cardiologist recommended coming to ER for further evaluation as well.  Of note, he is scheduled for heart cath on 2/28.  Patient does report current chest pain which began earlier today while walking into Dr's appointment.  He has not taken any medications today for this.  He has had intermittent cough over the last several days.  Denies diaphoresis, nausea, vomiting, back pain, abdominal pain, fever or congestion.  Past Medical History:  Diagnosis Date  . CHF (congestive heart failure) ([place])   . Coronary atherosclerosis of native coronary artery    Nonobstructive  . Essential hypertension, benign   . GERD (gastroesophageal reflux disease)   . HIT (heparin-induced thrombocytopenia) ([place])   . Mixed hyperlipidemia   . PSVT (paroxysmal supraventricular tachycardia) ([place])   . Renal insufficiency   . Type 2 diabetes mellitus [facility])     Patient Active Problem List   Diagnosis Date  Noted  . ESSENTIAL HYPERTENSION, BENIGN 04/15/2010  . CORONARY ATHEROSCLEROSIS NATIVE CORONARY ARTERY 04/15/2010  . PAROXYSMAL SUPRAVENTRICULAR TACHYCARDIA 04/15/2010  . DM 04/14/2010  . SHORTNESS OF BREATH 04/14/2010    Past Surgical History:  Procedure Laterality Date  . APPENDECTOMY         Home Medications    Prior to Admission medications   Medication Sig Start Date End Date Taking? Authorizing Provider  apixaban (ELIQUIS) 5 MG TABS tablet Take 1 tablet (5 mg total) by mouth 2 (two) times daily. 11/23/15  Yes Branch, Alphonse Guild, MD  CARTIA XT 240 MG 24 hr capsule TAKE 1 CAPSULE EVERY DAY  (DOSE  INCREASE) Patient taking differently: Take 240 mg by mouth once a day 06/10/16  Yes Branch, Alphonse Guild, MD  Cholecalciferol (D3-1000 PO) Take 2,000 Units by mouth daily.   Yes [provider]  Cyanocobalamin (B-12) 2500 MCG TABS Take 2,500 mcg by mouth daily.    Yes [provider]  finasteride (PROSCAR) 5 MG tablet Take 5 mg by mouth daily.  04/17/15  Yes [provider]  furosemide (LASIX) 20 MG tablet Take 20 mg by mouth daily as needed for fluid.    Yes [provider]  hydrochlorothiazide (HYDRODIURIL) 25 MG tablet Take 25 mg by mouth daily.  04/22/15  Yes [provider]  insulin glargine (LANTUS) 100 UNIT/ML injection Inject 30-40 Units into the skin See admin instructions. Inject 40 units SQ in the morning and inject 30 units SQ at  bedtime   Yes [provider]  Magnesium 250 MG TABS Take 250 mg by mouth daily.    Yes [provider]  meclizine (ANTIVERT) 12.5 MG tablet Take 12.5 mg by mouth 3 (three) times daily as needed for dizziness.   Yes [provider]  metFORMIN (GLUCOPHAGE) 1000 MG tablet Take 1,000 mg by mouth 2 (two) times daily with a meal.     Yes [provider]  metoprolol succinate (TOPROL-XL) 100 MG 24 hr tablet TAKE 1 TABLET EVERY DAY Patient taking differently: Take 100 mg by mouth  once a day 12/22/16  Yes Branch, Alphonse Guild, MD  nitroGLYCERIN (NITROSTAT) 0.4 MG SL tablet Place 0.4 mg under the tongue every 5 (five) minutes as needed for chest pain.   Yes [provider]  Potassium 95 MG TABS Take 95 mg by mouth daily.    Yes [provider]  traZODone (DESYREL) 100 MG tablet Take 100 mg by mouth at bedtime.  03/25/16  Yes [provider]    Family History Family History  Problem Relation Age of Onset  . Heart attack Father 26    Social History Social History   Tobacco Use  . Smoking status: Former Smoker    Packs/day: 2.00    Years: 30.00    Pack years: 60.00    Types: Cigarettes    Start date: 03/28/1953    Last attempt to quit: 03/29/1983    Years since quitting: 34.1  . Smokeless tobacco: Never Used  Substance Use Topics  . Alcohol use: Yes    Alcohol/week: 0.0 oz    Comment: Occasional  . Drug use: No     Allergies   Codeine; Heparin; and Oxycodone   Review of Systems Review of Systems  Respiratory: Positive for cough and shortness of breath.   Cardiovascular: Positive for chest pain. Negative for palpitations and leg swelling.  All other systems reviewed and are negative.    Physical Exam Updated Vital Signs BP (!) 118/96   Pulse (!) 48   Temp (!) 96.5 F (35.8 C) (Oral)   Resp (!) 24   Ht 5\' 7"  (1.702 m)   Wt 100.2 kg (221 lb)   SpO2 98%   BMI 34.61 kg/m   Physical Exam  Constitutional: He is oriented to person, place, and time. He appears well-developed and well-nourished. No distress.  HENT:  Head: Normocephalic and atraumatic.  Neck: Neck supple.  Cardiovascular: Normal heart sounds.  No murmur heard. Bradycardic  Pulmonary/Chest: Effort normal. No respiratory distress. He exhibits no tenderness.  Rhonchorous breath sounds.  Abdominal: Soft. He exhibits no distension. There is no tenderness.  Musculoskeletal:  No lower extremity edema or calf tenderness.  Neurological: He is alert and  oriented to person, place, and time.  Skin: Skin is warm and dry.  Nursing note and vitals reviewed.    ED Treatments / Results  Labs (all labs ordered are listed, but only abnormal results are displayed) Labs Reviewed  BASIC METABOLIC PANEL - Abnormal; Notable for the following components:      Result Value   Glucose, Bld 117 (*)    All other components within normal limits  CBC WITH DIFFERENTIAL/PLATELET - Abnormal; Notable for the following components:   RDW 15.8 (*)    Monocytes Absolute 1.2 (*)    All other components within normal limits  BRAIN NATRIURETIC PEPTIDE - Abnormal; Notable for the following components:   B Natriuretic Peptide 318.5 (*)    All other  components within normal limits  I-STAT TROPONIN, ED    EKG  EKG Interpretation  Date/Time:  Wednesday May 17 2017 15:17:52 EST Ventricular Rate:  54 PR Interval:  132 QRS Duration: 86 QT Interval:  450 QTC Calculation: 426 R Axis:   11 Text Interpretation:  Sinus bradycardia with occasional Premature ventricular complexes Nonspecific ST and T wave abnormality Abnormal ECG Confirmed by Tanna Furry [phone]) on 05/17/2017 3:37:55 PM       Radiology Dg Chest Portable 1 View  Result Date: 05/17/2017 CLINICAL DATA:  Chest pain EXAM: PORTABLE CHEST 1 VIEW COMPARISON:  08/07/2016 FINDINGS: The heart size and mediastinal contours are within normal limits. Both lungs are clear. The visualized skeletal structures are unremarkable. IMPRESSION: No active disease. Electronically Signed   By: Inez Catalina M.D.   On: 05/17/2017 16:08    Procedures Procedures (including critical care time)  Medications Ordered in ED Medications  nitroGLYCERIN (NITROSTAT) SL tablet 0.4 mg (0.4 mg Sublingual Given 05/17/17 1651)  ipratropium-albuterol (DUONEB) 0.5-2.5 (3) MG/3ML nebulizer solution 3 mL (3 mLs Nebulization Given 05/17/17 1624)     Initial Impression / Assessment and Plan / ED Course  I have reviewed the triage vital  signs and the nursing notes.  Pertinent labs & imaging results that were available during my care of the patient were reviewed by me and considered in my medical decision making (see chart for details).    Mike Wright is a 84 y.o. male who presents to ED for chest pain and shortness of breath which began today.  He had a similar episode last night which was relieved with nitro.  Patient with history of cardiac disease and actually has catheterization scheduled for next week.  On exam, patient is bradycardic.  No lower extremity edema or JVD.  Does have some rhonchorous breath sounds- will give duoneb EKG with nonspecific changes.  Troponin negative.  OB NP of 318.5.  Chest x-ray negative.  5:06 PM - Spoke with cardiology, Dr. Claiborne Billings. Cardiology to evaluate patient.   Cardiology to admit.  Patient discussed with Dr. Jeneen Rinks who agrees with treatment plan.   Final Clinical Impressions(s) / ED Diagnoses   Final diagnoses:  Chest pain  Chest pain with high risk for cardiac etiology    ED Discharge Orders    None       Ward, Ozella Almond, PA-C 05/17/17 Drema Halon    Tanna Furry, MD 05/17/17 2250

## 2017-05-18 ENCOUNTER — Other Ambulatory Visit: Payer: Self-pay

## 2017-05-18 ENCOUNTER — Encounter (HOSPITAL_COMMUNITY): Payer: Self-pay | Admitting: General Practice

## 2017-05-18 ENCOUNTER — Inpatient Hospital Stay (HOSPITAL_COMMUNITY)
Admission: EM | Disposition: A | Discharge status: Home / Self Care | Payer: Self-pay | Source: Home / Self Care | Attending: Cardiology

## 2017-05-18 DIAGNOSIS — I25110 Atherosclerotic heart disease of native coronary artery with unstable angina pectoris: Principal | ICD-10-CM

## 2017-05-18 HISTORY — PX: RIGHT HEART CATH: CATH118263

## 2017-05-18 HISTORY — PX: LEFT HEART CATH AND CORONARY ANGIOGRAPHY: CATH118249

## 2017-05-18 HISTORY — PX: CORONARY STENT INTERVENTION: CATH118234

## 2017-05-18 HISTORY — PX: CORONARY ANGIOPLASTY WITH STENT PLACEMENT: SHX49

## 2017-05-18 LAB — POCT I-STAT 3, ART BLOOD GAS (G3+)
Bicarbonate: 24.8 mmol/L (ref 20.0–28.0)
O2 SAT: 98 %
PH ART: 7.397 (ref 7.350–7.450)
TCO2: 26 mmol/L (ref 22–32)
pCO2 arterial: 40.3 mmHg (ref 32.0–48.0)
pO2, Arterial: 101 mmHg (ref 83.0–108.0)

## 2017-05-18 LAB — BASIC METABOLIC PANEL
Anion gap: 13 (ref 5–15)
BUN: 16 mg/dL (ref 6–20)
CHLORIDE: 103 mmol/L (ref 101–111)
CO2: 22 mmol/L (ref 22–32)
CREATININE: 0.94 mg/dL (ref 0.61–1.24)
Calcium: 8.8 mg/dL — ABNORMAL LOW (ref 8.9–10.3)
GFR calc Af Amer: >60 mL/min (ref >60)
GFR calc non Af Amer: >60 mL/min (ref >60)
Glucose, Bld: 119 mg/dL — ABNORMAL HIGH (ref 65–99)
POTASSIUM: 3.5 mmol/L (ref 3.5–5.1)
SODIUM: 138 mmol/L (ref 135–145)

## 2017-05-18 LAB — CBC
HEMATOCRIT: 34.5 % — AB (ref 39.0–52.0)
Hemoglobin: 11.1 g/dL — ABNORMAL LOW (ref 13.0–17.0)
MCH: 28.1 pg (ref 26.0–34.0)
MCHC: 32.2 g/dL (ref 30.0–36.0)
MCV: 87.3 fL (ref 78.0–100.0)
PLATELETS: 166 10*3/uL (ref 150–400)
RBC: 3.95 MIL/uL — ABNORMAL LOW (ref 4.22–5.81)
RDW: 16.2 % — ABNORMAL HIGH (ref 11.5–15.5)
WBC: 6.3 10*3/uL (ref 4.0–10.5)

## 2017-05-18 LAB — LIPID PANEL
Cholesterol: 106 mg/dL (ref 0–200)
HDL: 17 mg/dL — AB (ref >40)
LDL Cholesterol: 65 mg/dL (ref 0–99)
Total CHOL/HDL Ratio: 6.2 RATIO
Triglycerides: 119 mg/dL (ref <150)
VLDL: 24 mg/dL (ref 0–40)

## 2017-05-18 LAB — POCT I-STAT 3, VENOUS BLOOD GAS (G3P V)
ACID-BASE EXCESS: 1 mmol/L (ref 0.0–2.0)
BICARBONATE: 26.7 mmol/L (ref 20.0–28.0)
O2 SAT: 65 %
PH VEN: 7.375 (ref 7.250–7.430)
PO2 VEN: 35 mmHg (ref 32.0–45.0)
TCO2: 28 mmol/L (ref 22–32)
pCO2, Ven: 45.7 mmHg (ref 44.0–60.0)

## 2017-05-18 LAB — CBG MONITORING, ED
Glucose-Capillary: 87 mg/dL (ref 65–99)
Glucose-Capillary: 96 mg/dL (ref 65–99)

## 2017-05-18 LAB — TROPONIN I: Troponin I: <0.03 ng/mL (ref <0.03)

## 2017-05-18 LAB — POCT ACTIVATED CLOTTING TIME: Activated Clotting Time: 411 seconds

## 2017-05-18 SURGERY — LEFT HEART CATH AND CORONARY ANGIOGRAPHY
Anesthesia: LOCAL

## 2017-05-18 MED ORDER — ASPIRIN 81 MG PO CHEW: 81.0000 mg | CHEWABLE_TABLET | Freq: Every day | ORAL | Status: DC

## 2017-05-18 MED ORDER — FENTANYL CITRATE (PF) 100 MCG/2ML IJ SOLN
INTRAMUSCULAR | Status: DC | PRN
  Administered 2017-05-18: 25 ug via INTRAVENOUS

## 2017-05-18 MED ORDER — SODIUM CHLORIDE 0.9 % IV SOLN: 250.0000 mL | INTRAVENOUS | Status: DC | PRN

## 2017-05-18 MED ORDER — VERAPAMIL HCL 2.5 MG/ML IV SOLN
INTRAVENOUS | Status: DC | PRN
  Administered 2017-05-18: 10 mL via INTRA_ARTERIAL

## 2017-05-18 MED ORDER — BIVALIRUDIN BOLUS VIA INFUSION - CUPID
INTRAVENOUS | Status: DC | PRN
  Administered 2017-05-18: 75.15 mg via INTRAVENOUS

## 2017-05-18 MED ORDER — MECLIZINE HCL 12.5 MG PO TABS
12.5000 mg | ORAL_TABLET | Freq: Three times a day (TID) | ORAL | Status: DC | PRN
  Filled 2017-05-18: qty 1

## 2017-05-18 MED ORDER — LABETALOL HCL 5 MG/ML IV SOLN: 10.0000 mg | INTRAVENOUS | Status: AC | PRN

## 2017-05-18 MED ORDER — ASPIRIN 81 MG PO CHEW
81.0000 mg | CHEWABLE_TABLET | ORAL | Status: AC
  Administered 2017-05-18: 81 mg via ORAL
  Filled 2017-05-18: qty 1

## 2017-05-18 MED ORDER — MIDAZOLAM HCL 2 MG/2ML IJ SOLN
INTRAMUSCULAR | Status: DC | PRN
Start: 2017-05-18 — End: 2017-05-18
  Administered 2017-05-18: 1 mg via INTRAVENOUS

## 2017-05-18 MED ORDER — MIDAZOLAM HCL 2 MG/2ML IJ SOLN
INTRAMUSCULAR | Status: AC
  Filled 2017-05-18: qty 2

## 2017-05-18 MED ORDER — SODIUM CHLORIDE 0.9 % IV SOLN
INTRAVENOUS | Status: DC
  Administered 2017-05-18: 06:00:00 via INTRAVENOUS

## 2017-05-18 MED ORDER — BIVALIRUDIN TRIFLUOROACETATE 250 MG IV SOLR
INTRAVENOUS | Status: AC
  Filled 2017-05-18: qty 250

## 2017-05-18 MED ORDER — SODIUM CHLORIDE 0.9 % IV SOLN: INTRAVENOUS | Status: AC

## 2017-05-18 MED ORDER — CLOPIDOGREL BISULFATE 75 MG PO TABS
75.0000 mg | ORAL_TABLET | Freq: Every day | ORAL | Status: DC
  Administered 2017-05-19 – 2017-05-20 (×2): 75 mg via ORAL
  Filled 2017-05-18 (×2): qty 1

## 2017-05-18 MED ORDER — FENTANYL CITRATE (PF) 100 MCG/2ML IJ SOLN
INTRAMUSCULAR | Status: AC
  Filled 2017-05-18: qty 2

## 2017-05-18 MED ORDER — IOPAMIDOL (ISOVUE-370) INJECTION 76%
INTRAVENOUS | Status: DC | PRN
  Administered 2017-05-18: 118 mL via INTRA_ARTERIAL

## 2017-05-18 MED ORDER — IOPAMIDOL (ISOVUE-300) INJECTION 61%
INTRAVENOUS | Status: AC
  Filled 2017-05-18: qty 50

## 2017-05-18 MED ORDER — ONDANSETRON HCL 4 MG/2ML IJ SOLN: 4.0000 mg | Freq: Four times a day (QID) | INTRAMUSCULAR | Status: DC | PRN

## 2017-05-18 MED ORDER — SODIUM CHLORIDE 0.9% FLUSH
3.0000 mL | Freq: Two times a day (BID) | INTRAVENOUS | Status: DC
  Administered 2017-05-18: 3 mL via INTRAVENOUS

## 2017-05-18 MED ORDER — SODIUM CHLORIDE 0.9 % IV SOLN
INTRAVENOUS | Status: AC | PRN
  Administered 2017-05-18 (×2): 1.75 mg/kg/h via INTRAVENOUS

## 2017-05-18 MED ORDER — SODIUM CHLORIDE 0.9% FLUSH: 3.0000 mL | INTRAVENOUS | Status: DC | PRN

## 2017-05-18 MED ORDER — ASPIRIN 81 MG PO CHEW: 81.0000 mg | CHEWABLE_TABLET | ORAL | Status: DC

## 2017-05-18 MED ORDER — SODIUM CHLORIDE 0.9 % IV SOLN
INTRAVENOUS | Status: AC | PRN
  Administered 2017-05-18: 1000 mL via INTRAVENOUS

## 2017-05-18 MED ORDER — ACETAMINOPHEN 325 MG PO TABS: 650.0000 mg | ORAL_TABLET | ORAL | Status: DC | PRN

## 2017-05-18 MED ORDER — VERAPAMIL HCL 2.5 MG/ML IV SOLN
INTRAVENOUS | Status: AC
  Filled 2017-05-18: qty 2

## 2017-05-18 MED ORDER — LIDOCAINE HCL (PF) 1 % IJ SOLN
INTRAMUSCULAR | Status: DC | PRN
  Administered 2017-05-18 (×2): 2 mL

## 2017-05-18 MED ORDER — HYDRALAZINE HCL 20 MG/ML IJ SOLN: 5.0000 mg | INTRAMUSCULAR | Status: AC | PRN

## 2017-05-18 MED ORDER — CLOPIDOGREL BISULFATE 300 MG PO TABS
ORAL_TABLET | ORAL | Status: DC | PRN
  Administered 2017-05-18: 600 mg via ORAL

## 2017-05-18 MED ORDER — SODIUM CHLORIDE 0.9 % IV SOLN
INTRAVENOUS | Status: AC | PRN
  Administered 2017-05-18: 10 mL/h via INTRAVENOUS

## 2017-05-18 MED ORDER — SODIUM CHLORIDE 0.9% FLUSH
3.0000 mL | Freq: Two times a day (BID) | INTRAVENOUS | Status: DC
  Administered 2017-05-18 – 2017-05-19 (×2): 3 mL via INTRAVENOUS

## 2017-05-18 MED ORDER — LIDOCAINE HCL 1 % IJ SOLN
INTRAMUSCULAR | Status: AC
  Filled 2017-05-18: qty 20

## 2017-05-18 SURGICAL SUPPLY — 23 items
BALLN EUPHORA RX 2.5X20 (BALLOONS) ×2
BALLN ~~LOC~~ EUPHORA RX 2.5X8 (BALLOONS) ×2
BALLOON EUPHORA RX 2.5X20 (BALLOONS) ×1 IMPLANT
BALLOON ~~LOC~~ EUPHORA RX 2.5X8 (BALLOONS) ×1 IMPLANT
CATH 5FR JL3.5 JR4 ANG PIG MP (CATHETERS) ×2 IMPLANT
CATH BALLN WEDGE 5F 110CM (CATHETERS) ×2 IMPLANT
CATH VISTA GUIDE 6FR XBLAD3.5 (CATHETERS) ×2 IMPLANT
DEVICE RAD COMP TR BAND LRG (VASCULAR PRODUCTS) ×4 IMPLANT
GLIDESHEATH SLEND SS 6F .021 (SHEATH) ×2 IMPLANT
GUIDEWIRE INQWIRE 1.5J.035X260 (WIRE) ×1 IMPLANT
INQWIRE 1.5J .035X260CM (WIRE) ×2
KIT ENCORE 26 ADVANTAGE (KITS) ×2 IMPLANT
KIT HEMO VALVE WATCHDOG (MISCELLANEOUS) ×2 IMPLANT
KIT PREMIUM HAND CONTROLLER (KITS) ×2 IMPLANT
KIT SINGLE USE MANIFOLD (KITS) ×2 IMPLANT
PACK CARDIAC CATHETERIZATION (CUSTOM PROCEDURE TRAY) ×2 IMPLANT
SHEATH GLIDE SLENDER 4/5FR (SHEATH) ×2 IMPLANT
STENT SYNERGY DES 2.25X12 (Permanent Stent) ×2 IMPLANT
STENT SYNERGY DES 3.5X38 (Permanent Stent) ×2 IMPLANT
STENT SYNERGY DES 4X12 (Permanent Stent) ×2 IMPLANT
WIRE ASAHI PROWATER 180CM (WIRE) ×2 IMPLANT
WIRE HI TORQ BMW 190CM (WIRE) ×2 IMPLANT
WIRE HI TORQ VERSACORE-J 145CM (WIRE) ×2 IMPLANT

## 2017-05-18 NOTE — Progress Notes (Signed)
Patient arrived to floor with two TR Bands in place to right radial site, was reported prior to arrival of 1 TR band. BSR stated that patient had a "golf size hematoma" prior to transporting to 6 Central and 2nd TR band was then placed. At this time, no bruising noted, however, patient's fingers and right hand is noted to be cyanotic but will blanche, SPO2 monitor reading 94% on room air initially. Patient does have a brachial sheath in place to right brachial site with a little bruising noted, no active bleeding noted. Will continue to monitor. No S/S of distress noted or complaints voiced at this time. Patient has some scattered wheezing noted with shortness of breath with exertion, 2L nasal cannula applied for comfort, SPO2=97%

## 2017-05-18 NOTE — ED Notes (Signed)
Pt to cath lab on monitor and with RN x 2.

## 2017-05-18 NOTE — Interval H&P Note (Signed)
History and Physical Interval Note:  05/18/2017 1:57 PM  MCGUIRE GASPARYAN  has presented today for surgery, with the diagnosis of cp  The various methods of treatment have been discussed with the patient and family. After consideration of risks, benefits and other options for treatment, the patient has consented to  Procedure(s): LEFT HEART CATH AND CORONARY ANGIOGRAPHY (N/A) as a surgical intervention .  The patient's history has been reviewed, patient examined, no change in status, stable for surgery.  I have reviewed the patient's chart and labs.  Questions were answered to the patient's satisfaction.    Although we cannot give heparin, will plan to use radial approach given his respiratory difficulties.    Larae Grooms

## 2017-05-18 NOTE — ED Notes (Signed)
Dr. Lilia Argue contacted and is given report of condition. States to continue to cath lab with pt.

## 2017-05-18 NOTE — ED Notes (Signed)
Pt sitts up on bedside and becomes pale c/o chest pain 4/10. Pt sitts back and pain and shob start to resolve.

## 2017-05-18 NOTE — Progress Notes (Signed)
Site area: right brachial   Site Prior to Removal:  Level 0  Pressure Applied For 25 MINUTES    Minutes Beginning at 1730  Manual:   Yes.    Patient Status During Pull:  Patient remains A&O by four, unlabored breathing noted, NO S/S of distress noted or complaints voiced, small bruising noted.   Post Pull Groin Site:  Level 0  Post Pull Instructions Given:  Yes.    Post Pull Pulses Present:  Yes.    Dressing Applied:  Yes.    Comments:  Pressure dressing applied. Soft to touch, no hematoma noted. Post removal instructions provided.

## 2017-05-18 NOTE — Interval H&P Note (Signed)
Cath Lab Visit (complete for each Cath Lab visit)  Clinical Evaluation Leading to the Procedure:   ACS: Yes.    Non-ACS:    Anginal Classification: CCS IV  Anti-ischemic medical therapy: Minimal Therapy (1 class of medications)  Non-Invasive Test Results: Intermediate-risk stress test findings: cardiac mortality 1-3%/year  Prior CABG: No previous CABG      History and Physical Interval Note:  05/18/2017 9:41 AM  Mike Wright  has presented today for surgery, with the diagnosis of cp  The various methods of treatment have been discussed with the patient and family. After consideration of risks, benefits and other options for treatment, the patient has consented to  Procedure(s): LEFT HEART CATH AND CORONARY ANGIOGRAPHY (N/A) as a surgical intervention .  The patient's history has been reviewed, patient examined, no change in status, stable for surgery.  I have reviewed the patient's chart and labs.  Questions were answered to the patient's satisfaction.     Larae Grooms

## 2017-05-19 ENCOUNTER — Encounter (HOSPITAL_COMMUNITY): Payer: Self-pay | Admitting: Interventional Cardiology

## 2017-05-19 DIAGNOSIS — R0902 Hypoxemia: Secondary | ICD-10-CM

## 2017-05-19 DIAGNOSIS — I471 Supraventricular tachycardia, unspecified: Secondary | ICD-10-CM | POA: Diagnosis present

## 2017-05-19 DIAGNOSIS — I5033 Acute on chronic diastolic (congestive) heart failure: Secondary | ICD-10-CM | POA: Diagnosis present

## 2017-05-19 DIAGNOSIS — J441 Chronic obstructive pulmonary disease with (acute) exacerbation: Secondary | ICD-10-CM

## 2017-05-19 DIAGNOSIS — R062 Wheezing: Secondary | ICD-10-CM | POA: Diagnosis present

## 2017-05-19 DIAGNOSIS — D500 Iron deficiency anemia secondary to blood loss (chronic): Secondary | ICD-10-CM | POA: Diagnosis present

## 2017-05-19 DIAGNOSIS — J449 Chronic obstructive pulmonary disease, unspecified: Secondary | ICD-10-CM

## 2017-05-19 DIAGNOSIS — R001 Bradycardia, unspecified: Secondary | ICD-10-CM | POA: Diagnosis present

## 2017-05-19 DIAGNOSIS — Z9861 Coronary angioplasty status: Secondary | ICD-10-CM

## 2017-05-19 DIAGNOSIS — I480 Paroxysmal atrial fibrillation: Secondary | ICD-10-CM | POA: Diagnosis present

## 2017-05-19 DIAGNOSIS — I2510 Atherosclerotic heart disease of native coronary artery without angina pectoris: Secondary | ICD-10-CM | POA: Diagnosis present

## 2017-05-19 DIAGNOSIS — Z955 Presence of coronary angioplasty implant and graft: Secondary | ICD-10-CM

## 2017-05-19 DIAGNOSIS — E119 Type 2 diabetes mellitus without complications: Secondary | ICD-10-CM

## 2017-05-19 DIAGNOSIS — I5032 Chronic diastolic (congestive) heart failure: Secondary | ICD-10-CM | POA: Diagnosis present

## 2017-05-19 DIAGNOSIS — G4730 Sleep apnea, unspecified: Secondary | ICD-10-CM | POA: Diagnosis present

## 2017-05-19 LAB — CBC
HEMATOCRIT: 34.3 % — AB (ref 39.0–52.0)
HEMOGLOBIN: 11.1 g/dL — AB (ref 13.0–17.0)
MCH: 28.6 pg (ref 26.0–34.0)
MCHC: 32.4 g/dL (ref 30.0–36.0)
MCV: 88.4 fL (ref 78.0–100.0)
Platelets: 184 10*3/uL (ref 150–400)
RBC: 3.88 MIL/uL — ABNORMAL LOW (ref 4.22–5.81)
RDW: 16.1 % — AB (ref 11.5–15.5)
WBC: 6.3 10*3/uL (ref 4.0–10.5)

## 2017-05-19 LAB — BASIC METABOLIC PANEL
Anion gap: 10 (ref 5–15)
BUN: 13 mg/dL (ref 6–20)
CO2: 24 mmol/L (ref 22–32)
Calcium: 8.5 mg/dL — ABNORMAL LOW (ref 8.9–10.3)
Chloride: 104 mmol/L (ref 101–111)
Creatinine, Ser: 0.84 mg/dL (ref 0.61–1.24)
GFR calc Af Amer: >60 mL/min (ref >60)
Glucose, Bld: 135 mg/dL — ABNORMAL HIGH (ref 65–99)
POTASSIUM: 4 mmol/L (ref 3.5–5.1)
Sodium: 138 mmol/L (ref 135–145)

## 2017-05-19 LAB — GLUCOSE, CAPILLARY
GLUCOSE-CAPILLARY: 76 mg/dL (ref 65–99)
GLUCOSE-CAPILLARY: 231 mg/dL — AB (ref 65–99)
Glucose-Capillary: 116 mg/dL — ABNORMAL HIGH (ref 65–99)
Glucose-Capillary: 169 mg/dL — ABNORMAL HIGH (ref 65–99)
Glucose-Capillary: 163 mg/dL — ABNORMAL HIGH (ref 65–99)

## 2017-05-19 LAB — HEPATIC FUNCTION PANEL
ALK PHOS: 65 U/L (ref 38–126)
ALT: 13 U/L — AB (ref 17–63)
AST: 23 U/L (ref 15–41)
Albumin: 3 g/dL — ABNORMAL LOW (ref 3.5–5.0)
BILIRUBIN DIRECT: 0.2 mg/dL (ref 0.1–0.5)
BILIRUBIN INDIRECT: 0.5 mg/dL (ref 0.3–0.9)
BILIRUBIN TOTAL: 0.7 mg/dL (ref 0.3–1.2)
TOTAL PROTEIN: 6 g/dL — AB (ref 6.5–8.1)

## 2017-05-19 MED ORDER — CLOPIDOGREL BISULFATE 75 MG PO TABS: 75.0000 mg | ORAL_TABLET | Freq: Every day | ORAL | 0 refills | Status: DC

## 2017-05-19 MED ORDER — APIXABAN 5 MG PO TABS
5.0000 mg | ORAL_TABLET | Freq: Two times a day (BID) | ORAL | Status: DC
  Administered 2017-05-19 – 2017-05-20 (×3): 5 mg via ORAL
  Filled 2017-05-19 (×3): qty 1

## 2017-05-19 MED ORDER — ATORVASTATIN CALCIUM 80 MG PO TABS: 80.0000 mg | ORAL_TABLET | Freq: Every evening | ORAL | 0 refills | Status: DC

## 2017-05-19 MED ORDER — PANTOPRAZOLE SODIUM 40 MG PO TBEC: 40.0000 mg | DELAYED_RELEASE_TABLET | Freq: Every day | ORAL | 0 refills | Status: DC

## 2017-05-19 MED ORDER — METOPROLOL SUCCINATE ER 100 MG PO TB24: ORAL_TABLET | ORAL | Status: DC

## 2017-05-19 MED ORDER — ALBUTEROL SULFATE (2.5 MG/3ML) 0.083% IN NEBU
3.0000 mL | INHALATION_SOLUTION | RESPIRATORY_TRACT | Status: DC | PRN
  Administered 2017-05-19: 20:00:00 3 mL via RESPIRATORY_TRACT
  Filled 2017-05-19: qty 3

## 2017-05-19 MED ORDER — HEART ATTACK BOUNCING BOOK
Freq: Once | Status: AC
  Administered 2017-05-19: 07:00:00
  Filled 2017-05-19: qty 1

## 2017-05-19 MED ORDER — METOPROLOL SUCCINATE ER 50 MG PO TB24
50.0000 mg | ORAL_TABLET | Freq: Every day | ORAL | Status: DC
  Administered 2017-05-20: 50 mg via ORAL
  Filled 2017-05-19: qty 1

## 2017-05-19 MED ORDER — ALUM & MAG HYDROXIDE-SIMETH 200-200-20 MG/5ML PO SUSP
30.0000 mL | ORAL | Status: DC | PRN
  Administered 2017-05-19: 30 mL via ORAL
  Filled 2017-05-19: qty 30

## 2017-05-19 MED ORDER — VITAMIN D 1000 UNITS PO TABS
2000.0000 [IU] | ORAL_TABLET | Freq: Every day | ORAL | Status: DC
  Administered 2017-05-19 – 2017-05-20 (×2): 2000 [IU] via ORAL
  Filled 2017-05-19 (×2): qty 2

## 2017-05-19 MED ORDER — AMLODIPINE BESYLATE 5 MG PO TABS: 5.0000 mg | ORAL_TABLET | Freq: Every day | ORAL | 0 refills | Status: DC

## 2017-05-19 MED ORDER — MOMETASONE FURO-FORMOTEROL FUM 200-5 MCG/ACT IN AERO
2.0000 | INHALATION_SPRAY | Freq: Two times a day (BID) | RESPIRATORY_TRACT | Status: DC
  Administered 2017-05-19 – 2017-05-20 (×3): 2 via RESPIRATORY_TRACT
  Filled 2017-05-19: qty 8.8

## 2017-05-19 MED ORDER — ANGIOPLASTY BOOK
Freq: Once | Status: AC
  Administered 2017-05-19: 07:00:00
  Filled 2017-05-19: qty 1

## 2017-05-19 MED ORDER — INSULIN ASPART 100 UNIT/ML ~~LOC~~ SOLN
0.0000 [IU] | Freq: Three times a day (TID) | SUBCUTANEOUS | Status: DC
  Administered 2017-05-19: 23:00:00 5 [IU] via SUBCUTANEOUS
  Administered 2017-05-20: 3 [IU] via SUBCUTANEOUS

## 2017-05-19 MED ORDER — TIOTROPIUM BROMIDE MONOHYDRATE 18 MCG IN CAPS
18.0000 ug | ORAL_CAPSULE | Freq: Every day | RESPIRATORY_TRACT | Status: DC
  Administered 2017-05-19 – 2017-05-20 (×2): 18 ug via RESPIRATORY_TRACT
  Filled 2017-05-19: qty 5

## 2017-05-19 MED ORDER — MAGNESIUM HYDROXIDE 400 MG/5ML PO SUSP
30.0000 mL | Freq: Every day | ORAL | Status: DC | PRN
  Administered 2017-05-19: 15:00:00 30 mL via ORAL
  Filled 2017-05-19: qty 30

## 2017-05-19 MED ORDER — LEVALBUTEROL TARTRATE 45 MCG/ACT IN AERO: 1.0000 | INHALATION_SPRAY | Freq: Four times a day (QID) | RESPIRATORY_TRACT | 0 refills | Status: DC | PRN

## 2017-05-19 MED ORDER — PANTOPRAZOLE SODIUM 40 MG PO TBEC
40.0000 mg | DELAYED_RELEASE_TABLET | Freq: Every day | ORAL | Status: DC
  Administered 2017-05-19 – 2017-05-20 (×2): 40 mg via ORAL
  Filled 2017-05-19 (×2): qty 1

## 2017-05-19 MED ORDER — HYDRALAZINE HCL 20 MG/ML IJ SOLN
10.0000 mg | Freq: Once | INTRAMUSCULAR | Status: AC
  Administered 2017-05-19: 10 mg via INTRAVENOUS
  Filled 2017-05-19: qty 1

## 2017-05-19 MED ORDER — AMLODIPINE BESYLATE 5 MG PO TABS
5.0000 mg | ORAL_TABLET | Freq: Every day | ORAL | Status: DC
  Administered 2017-05-19 – 2017-05-20 (×2): 5 mg via ORAL
  Filled 2017-05-19 (×2): qty 1

## 2017-05-19 MED ORDER — BUDESONIDE-FORMOTEROL FUMARATE 160-4.5 MCG/ACT IN AERO: 2.0000 | INHALATION_SPRAY | Freq: Two times a day (BID) | RESPIRATORY_TRACT | 0 refills | Status: DC

## 2017-05-19 MED ORDER — LEVALBUTEROL HCL 0.63 MG/3ML IN NEBU
0.6300 mg | INHALATION_SOLUTION | Freq: Four times a day (QID) | RESPIRATORY_TRACT | Status: DC | PRN
  Administered 2017-05-19: 11:00:00 0.63 mg via RESPIRATORY_TRACT
  Filled 2017-05-19: qty 3

## 2017-05-19 MED ORDER — ASPIRIN 81 MG PO TBEC: 81.0000 mg | DELAYED_RELEASE_TABLET | Freq: Every day | ORAL | 0 refills | Status: DC

## 2017-05-19 MED ORDER — PREDNISONE 20 MG PO TABS
20.0000 mg | ORAL_TABLET | Freq: Every day | ORAL | Status: DC
  Administered 2017-05-19 – 2017-05-20 (×2): 20 mg via ORAL
  Filled 2017-05-19 (×2): qty 1

## 2017-05-19 MED FILL — Lidocaine HCl Local Inj 1%: INTRAMUSCULAR | Qty: 20 | Status: AC

## 2017-05-19 NOTE — Progress Notes (Addendum)
Appreciate pulm input.  Original DC summary was deleted as it was signed, and therefore not accessible to weekend team to addend. I moved it over into a pended note, which will be available under "incomplete." I spoke with Pecolia Ades NP who will be on this weekend to make her aware of what has been done already. Pulm regimen might need adjusting at discharge compared to what was sent in already. I confirmed [facility] is open from 8-6 tomorrow.  Oneka Parada PA-C

## 2017-05-19 NOTE — Care Management Note (Signed)
Case Management Note  Patient Details  Name: Mike Wright MRN: [id number] Date of Birth: 04-06-1933  Subjective/Objective:  From home, pta indep, s/p coronary stent intervention, will be on plavix. Patient will need home oxygen, NCM offered choice he would like to work with [facility], referral made to Santiago Glad , she will bring oxygen to patient room prior to dc.                   Action/Plan: DC home with home oxygen with [facility].  Expected Discharge Date:                  Expected Discharge Plan:  Home/Self Care  In-House Referral:     Discharge planning Services  CM Consult  Post Acute Care Choice:  Durable Medical Equipment Choice offered to:  Patient  DME Arranged:  Oxygen DME Agency:  [place]:    [place] Agency:     Status of Service:  Completed, signed off  If discussed at [place] of Stay Meetings, dates discussed:    Additional Comments:  Zenon Mayo, RN 05/19/2017, 12:54 PM

## 2017-05-19 NOTE — Progress Notes (Signed)
SATURATION QUALIFICATIONS: (This note is used to comply with regulatory documentation for home oxygen)  Patient Saturations on Room Air at Rest = 91%  Patient Saturations on Room Air while Ambulating = 83%  Patient Saturations on 2L Liters of oxygen while Ambulating = 93%  Please briefly explain why patient needs home oxygen:  Desaturation while ambulating.

## 2017-05-19 NOTE — Discharge Summary (Deleted)
Original DC summary was deleted as it was signed, and therefore not accessible to weekend team to addend. I moved it over into a pended note, and spoke with Pecolia Ades NP who will be on this weekend to make her aware of what has been done already.  Lucero Auzenne PA-C

## 2017-05-19 NOTE — Plan of Care (Signed)
  Education: Knowledge of General Education information will improve 05/19/2017 0427 - Progressing by Angelica Pou, Trilby Drummer, RN   Education: Understanding of cardiac disease, CV risk reduction, and recovery process will improve 05/19/2017 0427 - Progressing by Angelica Pou, Trilby Drummer, RN Understanding of medication regimen will improve 05/19/2017 0427 - Progressing by Tish Frederickson, RN   Activity: Ability to tolerate increased activity will improve 05/19/2017 0427 - Progressing by Tish Frederickson, RN   Cardiac: Ability to achieve and maintain adequate cardiopulmonary perfusion will improve 05/19/2017 0427 - Progressing by Tish Frederickson, RN Vascular access site(s) Level 0-1 will be maintained 05/19/2017 0427 - Completed/Met by Tish Frederickson, RN

## 2017-05-19 NOTE — Discharge Instructions (Addendum)

## 2017-05-19 NOTE — Progress Notes (Addendum)
Progress Note  Patient Name: Mike Wright Date of Encounter: 05/19/2017  Primary Cardiologist: Dr. [name]  Subjective   Patient reports feeling much better, but wheezing this AM. He states this happens at home. 40 years of tobacco abuse - previously quit. Has never been told he has lung issues. No problem with cath site.  Inpatient Medications    Scheduled Meds: . aspirin EC  81 mg Oral Daily  . atorvastatin  80 mg Oral q1800  . cholecalciferol  2,000 Units Oral Daily  . clopidogrel  75 mg Oral Q breakfast  . finasteride  5 mg Oral Daily  . insulin aspart  0-15 Units Subcutaneous TID WC  . magnesium oxide  200 mg Oral Daily  . metoprolol succinate  100 mg Oral Daily  . nitroGLYCERIN  1 inch Topical Q6H  . sodium chloride flush  3 mL Intravenous Q12H  . traZODone  100 mg Oral QHS  . vitamin B-12  2,500 mcg Oral Daily   Continuous Infusions: . sodium chloride     PRN Meds: sodium chloride, acetaminophen, meclizine, nitroGLYCERIN, ondansetron (ZOFRAN) IV, sodium chloride flush   Vital Signs    Vitals:   05/18/17 2200 05/19/17 0000 05/19/17 0512 05/19/17 0743  BP: (!) 165/73 (!) 167/51 (!) 125/46 (!) 145/85  Pulse: 72 75 67 77  Resp: (!) 25 (!) 23 19 20   Temp:   98.1 F (36.7 C) 98.2 F (36.8 C)  TempSrc:   Oral Tympanic  SpO2: 93% 92% 94% 96%  Weight:   218 lb (98.9 kg)   Height:        Intake/Output Summary (Last 24 hours) at 05/19/2017 0827 Last data filed at 05/19/2017 0813 Gross per 24 hour  Intake 1012.5 ml  Output 250 ml  Net 762.5 ml   Filed Weights   05/17/17 1534 05/19/17 0512  Weight: 221 lb (100.2 kg) 218 lb (98.9 kg)    Telemetry    NSR, occ HR upper 30s in early AM hours, occ PACs - Personally Reviewed   Physical Exam   GEN: No acute distress, obese WM.  HEENT: Normocephalic, atraumatic, sclera non-icteric. Neck: No JVD or bruits. Cardiac: RRR no murmurs, rubs, or gallops.  Radials/DP/PT 1+ and equal bilaterally.  Respiratory:  Diffusely diminished with scattered exp wheezing. Breathing is unlabored. GI: Soft, nontender, non-distended, BS +x 4. MS: no deformity. Extremities: No clubbing or cyanosis. No edema. Distal pedal pulses are 2+ and equal bilaterally. Right radial cath site without hematoma or ecchymosis; good pulse. Neuro:  AAOx3. Follows commands. Psych:  Responds to questions appropriately with a normal affect.  Labs    Chemistry Recent Labs  Lab 05/17/17 1532 05/18/17 0700 05/19/17 0555  NA 140 138 138  K 4.3 3.5 4.0  CL 101 103 104  CO2 26 22 24   GLUCOSE 117* 119* 135*  BUN 14 16 13   CREATININE 0.94 0.94 0.84  CALCIUM 9.4 8.8* 8.5*  GFRNONAA >60 >60 >60  GFRAA >60 >60 >60  ANIONGAP 13 13 10      Hematology Recent Labs  Lab 05/17/17 1532 05/18/17 0700 05/19/17 0555  WBC 8.0 6.3 6.3  RBC 4.65 3.95* 3.88*  HGB 13.3 11.1* 11.1*  HCT 40.8 34.5* 34.3*  MCV 87.7 87.3 88.4  MCH 28.6 28.1 28.6  MCHC 32.6 32.2 32.4  RDW 15.8* 16.2* 16.1*  PLT 179 166 184    Cardiac Enzymes Recent Labs  Lab 05/17/17 2045 05/18/17 0246 05/18/17 1009  TROPONINI <0.03 <0.03 <0.03  Recent Labs  Lab 05/17/17 1546  TROPIPOC 0.00     BNP Recent Labs  Lab 05/17/17 1530  BNP 318.5*     DDimer No results for input(s): DDIMER in the last 168 hours.   Radiology    Dg Chest Portable 1 View  Result Date: 05/17/2017 CLINICAL DATA:  Chest pain EXAM: PORTABLE CHEST 1 VIEW COMPARISON:  08/07/2016 FINDINGS: The heart size and mediastinal contours are within normal limits. Both lungs are clear. The visualized skeletal structures are unremarkable. IMPRESSION: No active disease. Electronically Signed   By: Inez Catalina M.D.   On: 05/17/2017 16:08    Patient Profile     84 y.o. male with nonobstructive CAD 2012, PAF on Eliquis, PSVT/palpitations, chronic diastolic CHF, HTN, aortic stenosis (?unclear where this came from - no AS/AI on echo 07/2016), renal insufficiency (?normal renal function in Epic),  DM, mild OSA presented chest pain/dyspnea felt to represent [name]. Also noted to have SB in the 30s-40s on admission.  Assessment & Plan    1. Unstable angina/CAD - cath with DES to D1, DES to prox-mid LAD, DES to prox LAD overlapping the prior stent, LVEF 55-65%. Recommendation is for DAPT and Eliquis for 30 days then stop aspirin after 30 days. Dr. Irish Lack states "After 6 months, can consider stopping clopidogrel if he has bleeding issues.  Given the number [sic] of stents, would try to complete one year of clopidogrel if no bleeding issues." Will review NTG paste conversion with MD (remains on this at this time). Statin added. If the patient is tolerating statin at time of follow-up appointment, would consider rechecking liver function/lipid panel in 6-8 weeks. Will add baseline LFTs.  2. Paroxysmal atrial fib with sinus bradycardia - diltiazem stopped on admission. HR stable on Toprol. Resume apixaban today.  3. Chronic diastolic CHF - normal LVEDP and right heart pressures. Was on HCTZ daily and Lasix PRN prior to admission, will review with MD.  4. Mild OSA - patient has chosen not to wear CPAP; importance reiterated with pt.  5. Anemia - prior Hgb in the 13 range, was 11.1 pre-cath and 11.1 post cath. May be related to hydration, would monitor as OP. Bleeding precautions reviewed.  6. Diabetes mellitus - on Insulin. Metformin will need to be held 48 hours post-cath.  7. Suspected COPD - will review management with MD. Patient reports h/o wheezing at home as well. Will try a neb.  For questions or updates, please contact [place] Please consult [email or web address] for contact info under Cardiology/STEMI.  Signed, Charlie Pitter, PA-C 05/19/2017, 8:27 AM    Patient seen and examined. Agree with assessment and plan. Feels better since PCI to LAD and Dx. He started smoking at age 89 and quit tobacco ~ 25 yrs ago. Barrel chest, diffusely decreased BS and end expiratory wheezing are highly  suggestive of COPD.  Will give neb RX now and initiate symbicort 160/4.5 bid and arrange outpatient pulmonary eval.  Discussed importance of using CPAP.  Will decrease Toprol XL to 50 mg with wheezing and nocturnal bradycardia. Plan DC today.   Troy Sine, MD, [facility] 05/19/2017 8:48 AM

## 2017-05-19 NOTE — Consult Note (Signed)
PULMONARY / CRITICAL CARE MEDICINE   Name: Mike Wright MRN: [id number] DOB: 1934/03/04    ADMISSION DATE:  05/17/2017 CONSULTATION DATE:  05/19/2017  REFERRING MD:  Claiborne Billings  CHIEF COMPLAINT:  Dyspnea, wheezing  HISTORY OF PRESENT ILLNESS:   This is a pleasant 84 year old male with a past medical history significant for tobacco abuse.  He said he smoked at least 1 pack of cigarettes daily for 40 years and quit about 14 years ago.  He was admitted to this hospital with substernal chest pressure which was relieved by nitroglycerin.  On May 18, 2017 he had a left heart catheterization showing multivessel disease and a drug-eluting stent was placed x3.  He was set to be discharged today but he was noted to have some wheezing.  He says he has been coughing up clear mucus.  He tells me that he has this every time the weather changes.  His girlfriend says that he has bronchitis.  However, he reports that he does not have to go to the doctor very often with bronchitis symptoms.  He is never been told that he had a lung problem with the exception of a spontaneous pneumothorax at age 28.  Notably during this hospitalization he was noted to require oxygen while exerting himself.  He says that he was given a breathing treatment today which is the first time he is ever received a medicine like that and he said that it did help his breathing.  PAST MEDICAL HISTORY :  He  has a past medical history of Anemia, CAD in native artery, Chronic diastolic CHF (congestive heart failure) ([place]), Essential hypertension, benign, GERD (gastroesophageal reflux disease), Heart murmur, History of hiatal hernia, HIT (heparin-induced thrombocytopenia) ([place]), Hypoxia, Mixed hyperlipidemia, PAF (paroxysmal atrial fibrillation) ([place]), PSVT (paroxysmal supraventricular tachycardia) ([place]), Renal insufficiency, Sinus bradycardia, Sleep apnea, Type 2 diabetes mellitus ([place]), and Wheezing.  PAST SURGICAL HISTORY: He  has a past  surgical history that includes Appendectomy; Joint replacement; Total knee arthroplasty (Bilateral); Rhinoplasty; Back surgery; Posterior lumbar fusion; Cardiac catheterization (7353G; 2012;); Coronary angioplasty with stent (05/18/2017); LEFT HEART CATH AND CORONARY ANGIOGRAPHY (N/A, 05/18/2017); RIGHT HEART CATH (N/A, 05/18/2017); and CORONARY STENT INTERVENTION (N/A, 05/18/2017).  Allergies  Allergen Reactions  . Codeine Shortness Of Breath  . Heparin Other (See Comments)    +HIT,  Severe bleeding   . Oxycodone Other (See Comments)    "Made me act out of my mind"    No current facility-administered medications on file prior to encounter.    Current Outpatient Medications on File Prior to Encounter  Medication Sig  . apixaban (ELIQUIS) 5 MG TABS tablet Take 1 tablet (5 mg total) by mouth 2 (two) times daily.  [name] CARTIA XT 240 MG 24 hr capsule TAKE 1 CAPSULE EVERY DAY  (DOSE  INCREASE) (Patient taking differently: Take 240 mg by mouth once a day)  . Cholecalciferol (D3-1000 PO) Take 2,000 Units by mouth daily.  . Cyanocobalamin (B-12) 2500 MCG TABS Take 2,500 mcg by mouth daily.   . finasteride (PROSCAR) 5 MG tablet Take 5 mg by mouth daily.   . furosemide (LASIX) 20 MG tablet Take 20 mg by mouth daily as needed for fluid.   . hydrochlorothiazide (HYDRODIURIL) 25 MG tablet Take 25 mg by mouth daily.   . insulin glargine (LANTUS) 100 UNIT/ML injection Inject 30-40 Units into the skin See admin instructions. Inject 40 units SQ in the morning and inject 30 units SQ at bedtime  . Magnesium 250 MG TABS  Take 250 mg by mouth daily.   . meclizine (ANTIVERT) 12.5 MG tablet Take 12.5 mg by mouth 3 (three) times daily as needed for dizziness.  . metFORMIN (GLUCOPHAGE) 1000 MG tablet Take 1,000 mg by mouth 2 (two) times daily with a meal.    . nitroGLYCERIN (NITROSTAT) 0.4 MG SL tablet Place 0.4 mg under the tongue every 5 (five) minutes as needed for chest pain.  [name] Potassium 95 MG TABS Take 95 mg by  mouth daily.   . traZODone (DESYREL) 100 MG tablet Take 100 mg by mouth at bedtime.     FAMILY HISTORY:  His indicated that his father is deceased.   SOCIAL HISTORY: He  reports that he quit smoking about 34 years ago. His smoking use included cigarettes. He started smoking about 64 years ago. He has a 60.00 pack-year smoking history. he has never used smokeless tobacco. He reports that he drinks alcohol. He reports that he does not use drugs.  REVIEW OF SYSTEMS:   Gen: Denies fever, chills, weight change, fatigue, night sweats HEENT: Denies blurred vision, double vision, hearing loss, tinnitus, sinus congestion, rhinorrhea, sore throat, neck stiffness, dysphagia PULM: per HPI CV: Denies chest pain, edema, orthopnea, paroxysmal nocturnal dyspnea, palpitations GI: Denies abdominal pain, nausea, vomiting, diarrhea, hematochezia, melena, constipation, change in bowel habits GU: Denies dysuria, hematuria, polyuria, oliguria, urethral discharge Endocrine: Denies hot or cold intolerance, polyuria, polyphagia or appetite change Derm: Denies rash, dry skin, scaling or peeling skin change Heme: Denies easy bruising, bleeding, bleeding gums Neuro: Denies headache, numbness, weakness, slurred speech, loss of memory or consciousness   SUBJECTIVE:    VITAL SIGNS: BP (!) 165/67   Pulse 70   Temp 98.5 F (36.9 C) (Oral)   Resp (!) 22   Ht 5\' 7"  (1.702 m)   Wt 218 lb (98.9 kg)   SpO2 94%   BMI 34.14 kg/m   HEMODYNAMICS:    VENTILATOR SETTINGS:    INTAKE / OUTPUT: I/O last 3 completed shifts: In: 652.5 [P.O.:480; I.V.:172.5] Out: 250 [Urine:250]  PHYSICAL EXAMINATION:  General:  Mild respiratory distress HENT: NCAT OP clear PULM: Wheezing bilaterally CV: RRR, no mgr GI: BS+, soft, nontender MSK: normal bulk and tone Neuro: awake, alert, no distress, MAEW   LABS:  BMET Recent Labs  Lab 05/17/17 1532 05/18/17 0700 05/19/17 0555  NA 140 138 138  K 4.3 3.5 4.0  CL  101 103 104  CO2 26 22 24   BUN 14 16 13   CREATININE 0.94 0.94 0.84  GLUCOSE 117* 119* 135*    Electrolytes Recent Labs  Lab 05/17/17 1532 05/18/17 0700 05/19/17 0555  CALCIUM 9.4 8.8* 8.5*    CBC Recent Labs  Lab 05/17/17 1532 05/18/17 0700 05/19/17 0555  WBC 8.0 6.3 6.3  HGB 13.3 11.1* 11.1*  HCT 40.8 34.5* 34.3*  PLT 179 166 184    Coag's No results for input(s): APTT, INR in the last 168 hours.  Sepsis Markers No results for input(s): LATICACIDVEN, PROCALCITON, O2SATVEN in the last 168 hours.  ABG Recent Labs  Lab 05/18/17 1450  PHART 7.397  PCO2ART 40.3  PO2ART 101.0    Liver Enzymes Recent Labs  Lab 05/19/17 0555  AST 23  ALT 13*  ALKPHOS 65  BILITOT 0.7  ALBUMIN 3.0*    Cardiac Enzymes Recent Labs  Lab 05/17/17 2045 05/18/17 0246 05/18/17 1009  TROPONINI <0.03 <0.03 <0.03    Glucose Recent Labs  Lab 05/17/17 2234 05/18/17 0754 05/18/17 1147 05/18/17 1629 05/19/17  4081 05/19/17 1134  GLUCAP 137* 87 96 76 116* 169*    Imaging No results found.  Chest x-ray from this admission images reviewed showing cardiomegaly and normal pulmonary parenchyma   DISCUSSION: 84 year old male with a significant past smoking history who was here for a drug-eluting stent to be placed in the setting of unstable angina from coronary artery disease.  After admission he has been diagnosed with a COPD exacerbation.  He is also been placed on oxygen for the first time.  ASSESSMENT / PLAN:  PULMONARY A: COPD with acute exacerbation Acute respiratory failure with hypoxemia Prior tobacco use P:   Will need outpatient pulmonary follow-up and spirometry testing Start Spiriva daily Use albuterol 2 puffs every 4 hours as needed for chest tightness wheezing or shortness of breath Prednisone 20 mg daily times 5 days Use oxygen to maintain O2 saturation greater than 88%  As he just had the drug-eluting stent placed yesterday and he has an unstable  respiratory status due to acute COPD exacerbation I recommend watching him another 24 hours in house.  If his breathing has improved by tomorrow than he could be discharged.  Roselie Awkward, MD [place] [facility] Pager: [phone] Cell: [phone] After 3pm or if no response, call [phone]  05/19/2017, 2:26 PM

## 2017-05-19 NOTE — Progress Notes (Addendum)
CARDIAC REHAB PHASE I   PRE:  Rate/Rhythm: 75 SR  BP:  Supine: 168/65  Sitting:   Standing:    SaO2: 90-92 RA  MODE:  Ambulation: 410 ft   POST:  Rate/Rhythm: 80 SR  BP:  Supine:   Sitting: 157/97  Standing:    SaO2: 82 RA during walk at lowest after rest stops 92 RA [id number] Assisted X 1 and used walker to ambulate. Gait steady with walker. Pt is DOE and walks fast. I encouraged him to pace himself. His lowest saturation on room air was 82%. I would have him to rest and it would return to 91-92% on room air. As he increased his distance it dropped several times walking and would return with rest stops. He denies any chest pain walking. Pt to recliner after walk with cal light in reach and wife present. I completed stent education with pt and wife. We discussed CHF signs and  Symptoms,exercise guidelines, Plavix, ASA, diabetic diet, and low sodium, proper use of sl NTG, calling 911 and Outpt. CRP. Will send referral to Outpt. CRP in [place]. I reported room air saturations to nurse.  Rodney Langton RN 05/19/2017 10:32 AM

## 2017-05-19 NOTE — Discharge Summary (Signed)
Discharge Summary    Patient ID: Mike Wright,  MRN: [id number], DOB/AGE: Mar 10, 1934 84 y.o.  Admit date: 05/17/2017 Discharge date: 05/20/2017  Primary Care Provider: Deloria Lair. Primary Cardiologist: Dr. [name]  Discharge Diagnoses    Principal Problem:   Unstable angina [facility] Active Problems:   Anemia   CAD in native artery   Chronic diastolic CHF (congestive heart failure) (HCC)   PSVT (paroxysmal supraventricular tachycardia) (HCC)   PAF (paroxysmal atrial fibrillation) (HCC)   Sinus bradycardia   Sleep apnea   Wheezing   Type 2 diabetes mellitus ([place])   Hypoxia   COPD with acute exacerbation (HCC)   Status post coronary artery stent placement    Diagnostic Studies/Procedures      Cath 05/18/17 Procedures   CORONARY STENT INTERVENTION  LEFT HEART CATH AND CORONARY ANGIOGRAPHY  RIGHT HEART CATH  Conclusion     Dist RCA lesion is 40% stenosed.  Prox RCA lesion is 25% stenosed.  1st Diag lesion is 75% stenosed.  A drug-eluting stent was successfully placed using a STENT SYNERGY DES 2.25X12.  Post intervention, there is a 0% residual stenosis.  Prox LAD to Mid LAD lesion is 75% stenosed.  A drug-eluting stent was successfully placed using a STENT SYNERGY DES 3.5X38.  Post intervention, there is a 0% residual stenosis.  Prox LAD lesion is 70% stenosed.  A drug-eluting stent was successfully placed using a STENT SYNERGY DES 4X12, overlapping the prior stent.  Post intervention, there is a 0% residual stenosis.  The left ventricular systolic function is normal.  The left ventricular ejection fraction is 55-65% by visual estimate.  LV end diastolic pressure is normal.  There is no aortic valve stenosis.  Ao sat 98%, PA sat 65%; PA mean 25 mm Hg; unable to wedge the catheter.  Normal right heat pressures.  Tortuous right subclavian making catheter navigation difficult from the right radial approach.   Continue dual antiplatelet  therapy along with Eliquis for 30 days.  Restart Eliquis tomorrow.  After 30 days, stop aspirin.  Continue Plavix and Eliquis.    After 6 months, can consider stopping clopidogrel if he has bleeding issues.  Given the nomber of stents, would try to complete one year of clopidogrel if no bleeding issues.      _____________     History of Present Illness     Mike Wright is a 84 y.o. male with history of nonobstructive CAD 2012, PAF on Eliquis, PSVT/palpitations, chronic diastolic CHF, HTN, aortic stenosis (?unclear where this came from - no AS/AI on echo 07/2016), renal insufficiency (?normal renal function in Epic), DM, mild OSA, 40 years of tobacco abuse presented to [facility] with chest pain/dyspnea felt to represent unstable angina. He was also noted to have sinus bradycardia in the 30s-40s on admission. He actually was going to have a cath electively as an outpatient, but presented to the hospital with progressive symptoms so was admitted for further evaluation.  Hospital Course    1. Unstable angina/CAD - he ruled out for MI. He underwent cath 05/18/17 with DES to D1, DES to prox-mid LAD, DES to prox LAD overlapping the prior stent, LVEF 55-65%. It was recommended he continue DAPT and Eliquis for 30 days then stop aspirin after 30 days. Dr. Irish Lack stated in cath note, "After 6 months, can consider stopping clopidogrel if he has bleeding issues. Given the number [sic] of stents, would try to complete one year of clopidogrel if no bleeding  issues." Statin was added. If the patient is tolerating statin at time of follow-up appointment, would consider rechecking liver function/lipid panel in 6-8 weeks. Baseline LFTs were sent with AST/ALT OK.  2. Paroxysmal atrial fib with sinus bradycardia - diltiazem was stopped on admission due to HR 30s-40s. Metoprolol was also decreased due to bradycardia and wheezing. He did not have any evidence of breakthrough atrial fib. Eliquis was resumed the day  after cath. We sent in a prescription for Protonix given his triple therapy.  3. Chronic diastolic CHF - right heart cath showed normal LVEDP and right heart pressures. He was on HCTZ prior to admission which Dr. Claiborne Billings did not feel he required (was also on potassium PTA with HCTZ which was stopped). We continued prescription for Lasix PRN at home. We suspect a strong component of chronic dyspnea is related to #7.  4. Mild OSA - patient has previously chosen not to wear CPAP; importance reiterated with pt.  5. Anemia - admitting Hgb was 13.3. This was 11.1 pre-cath and 11.1 post cath. May be related to hydration, would monitor as OP. The patient was asked to f/u with primary care within 1 week fo discharge. Bleeding precautions outlined.  6. Diabetes mellitus - on Insulin and Metformin prior to admission. Pt was advised metformin will need to be held 48 hours post-cath (resume 2/24).  7. Suspected COPD - he reports 40 year hx of tobacco abuse but previously quit. He reports chronic wheezing at home that was noted on admission. Nebulizer was administered. The initial plan was for him to start Symbicort and Xopenex but wheezing worsened throughout the day, thus he was seen by pulmonology. Dr. Anastasia Pall assessment was COPD with acute exacerbation, Acute respiratory failure with hypoxemia and Prior tobacco use. His recommendation is outpatient pulmonary follow-up and spirometry testing, Start Spiriva daily, Use albuterol 2 puffs every 4 hours as needed for chest tightness wheezing or shortness of breath  and Prednisone 20 mg daily times 5 days. Home oxygen for to keep sats >90%.  His wheezing is much improved today.  8. Essential HTN - as above, metoprolol was reduced due to wheezing, cardizem was stopped due to bradycardia, and he not felt to require resumption of home HCTZ by Dr. Claiborne Billings. Amlodipine was added for additional BP control. Instructions were included on his discharge to monitor BP at home  and call if running >130/80.  There was no appt availability in the [place] offices for [facility] visit; patient was agreeable to f/u in [facility] office.   Of note, the patient typically uses mail order pharmacy. Given multiple med adjustments this admission, per our discussion, he elected to use local [facility] for a 30 day supply of initial medication changes - as of 2/22, this included a 30-day supply of amlodipine, aspirin, atorvastatin, Protonix, clopidogrel, Symbicort and Xopenex. He was advised that he would not take any aspirin after 30 days. He elected to cut the metoprolol tablets he has at home in half to equal 50mg . If this regimen is deemed stable in follow-up he will need meds routed to mail order pharmacy for long-term fill.  _____________  Discharge Vitals Blood pressure (!) 178/78, pulse 70, temperature 98.4 F (36.9 C), temperature source Oral, resp. rate 20, height 5\' 7"  (1.702 m), weight 214 lb 11.7 oz (97.4 kg), SpO2 96 %.  Filed Weights   05/17/17 1534 05/19/17 0512 05/20/17 0422  Weight: 221 lb (100.2 kg) 218 lb (98.9 kg) 214 lb 11.7 oz (97.4 kg)  Physical Exam  Constitutional: He is oriented to person, place, and time. He appears well-developed and well-nourished. No distress.  HENT:  Head: Normocephalic and atraumatic.  Neck: Normal range of motion. Neck supple. No JVD present.  Cardiovascular: Normal rate and regular rhythm.  Murmur heard. 3/6 systolic  Pulmonary/Chest: Effort normal and breath sounds normal. No respiratory distress.  Faint end expiratory wheezes  Abdominal: Soft. Bowel sounds are normal. He exhibits no distension. There is no tenderness.  Musculoskeletal: Normal range of motion. He exhibits no edema or deformity.  Neurological: He is alert and oriented to person, place, and time.  Skin: Skin is warm and dry.  Psychiatric: He has a normal mood and affect. His behavior is normal. Thought content normal.    Labs & Radiologic Studies     CBC Recent Labs    05/17/17 1532  05/19/17 0555 05/20/17 0325  WBC 8.0   < > 6.3 6.9  NEUTROABS 4.7  --   --   --   HGB 13.3   < > 11.1* 11.6*  HCT 40.8   < > 34.3* 36.1*  MCV 87.7   < > 88.4 86.0  PLT 179   < > 184 195   < > = values in this interval not displayed.   Basic Metabolic Panel Recent Labs    05/19/17 0555 05/20/17 0325  NA 138 134*  K 4.0 4.1  CL 104 102  CO2 24 23  GLUCOSE 135* 219*  BUN 13 11  CREATININE 0.84 0.82  CALCIUM 8.5* 8.8*   Liver Function Tests Recent Labs    05/19/17 0555  AST 23  ALT 13*  ALKPHOS 65  BILITOT 0.7  PROT 6.0*  ALBUMIN 3.0*   No results for input(s): LIPASE, AMYLASE in the last 72 hours. Cardiac Enzymes Recent Labs    05/17/17 2045 05/18/17 0246 05/18/17 1009  TROPONINI <0.03 <0.03 <0.03   Fasting Lipid Panel Recent Labs    05/18/17 0246  CHOL 106  HDL 17*  LDLCALC 65  TRIG 119  CHOLHDL 6.2   _____________  Dg Chest Portable 1 View  Result Date: 05/17/2017 CLINICAL DATA:  Chest pain EXAM: PORTABLE CHEST 1 VIEW COMPARISON:  08/07/2016 FINDINGS: The heart size and mediastinal contours are within normal limits. Both lungs are clear. The visualized skeletal structures are unremarkable. IMPRESSION: No active disease. Electronically Signed   By: Inez Catalina M.D.   On: 05/17/2017 16:08   Disposition   Pt is being discharged home today in good condition.  Follow-up Plans & Appointments    Follow-up Information    Deloria Lair., MD Follow up.   Specialty:  Family Medicine Why:  Your bloodwork suggested mild anemia. We also suspect you have COPD/emphysema. Follow up with your primary care provider within 5-7 days. Contact information: [place] [address] [phone]        Richardson Dopp T, PA-C Follow up.   Specialties:  Cardiology, Physician Assistant Why:  [place] location on [address] - 05/30/17 at 2:15pm. Arrive 15 minutes prior to appointment to check in.  There were no appointments available in the [place] or [place] office. Contact information: [address] [place] [address] [place] Follow up.   Why:  home oxygen Contact information: [place] [address] [phone]        [facility] Follow  up.   Specialty:  Pulmonology Why:  Please follow up with a pulmonologist for COPD and spirometry testing.  Contact information: [place] [phone]         Discharge Instructions    Amb Referral to Cardiac Rehabilitation   Complete by:  As directed    Diagnosis:   Coronary Stents PTCA     Diet - low sodium heart healthy   Complete by:  As directed    Discharge instructions   Complete by:  As directed    Per our discussion, your new prescriptions were sent in to [facility] drug for a 30 day supply.  You may cut the metoprolol tablets you have at home in half to equal the lower 50mg  dose.  At your follow-up appointment, please review your medications and request that your refills be sent into the mail order pharmacy. HOWEVER, IMPORTANT: You will not take any more aspirin after 30 days - so this one does not need to be sent into the mail order pharmacy after you finish your 30 days.  Your diltiazem, hydrochlorothiazide, and potassium were stopped.   You need to hold your metformin for at least 48 hours after your cath. You can restart this on 05/21/17.  If you notice any bleeding such as blood in stool, black tarry stools, blood in urine, nosebleeds or any other unusual bleeding, call your doctor immediately.  No driving for 2 days. No lifting over 5 lbs for 1 week. No sexual activity for 1 week. Keep procedure site clean & dry. If you notice increased pain, swelling, bleeding or pus, call/return!  You may shower, but no soaking baths/pools for 1 week.   Increase activity slowly   Complete by:   As directed       Discharge Medications   Allergies as of 05/20/2017      Reactions   Codeine Shortness Of Breath   Heparin Other (See Comments)   +HIT,  Severe bleeding    Oxycodone Other (See Comments)   "Made me act out of my mind"      Medication List    STOP taking these medications   CARTIA XT 240 MG 24 hr capsule Generic drug:  diltiazem   hydrochlorothiazide 25 MG tablet Commonly known as:  HYDRODIURIL   Potassium 95 MG Tabs     TAKE these medications   albuterol (2.5 MG/3ML) 0.083% nebulizer solution Commonly known as:  PROVENTIL Inhale 3 mLs into the lungs every 4 (four) hours as needed for wheezing or shortness of breath.   amLODipine 5 MG tablet Commonly known as:  NORVASC Take 1 tablet (5 mg total) by mouth daily.   apixaban 5 MG Tabs tablet Commonly known as:  ELIQUIS Take 1 tablet (5 mg total) by mouth 2 (two) times daily.   aspirin 81 MG EC tablet Take 1 tablet (81 mg total) by mouth daily. For 30 days then STOP.   atorvastatin 80 MG tablet Commonly known as:  LIPITOR Take 1 tablet (80 mg total) by mouth every evening.   B-12 2500 MCG Tabs Take 2,500 mcg by mouth daily.   clopidogrel 75 MG tablet Commonly known as:  PLAVIX Take 1 tablet (75 mg total) by mouth daily.   D3-1000 PO Take 2,000 Units by mouth daily.   finasteride 5 MG tablet Commonly known as:  PROSCAR Take 5 mg by mouth daily.   furosemide 20 MG tablet Commonly known as:  LASIX Take 20 mg by  mouth daily as needed for fluid.   insulin glargine 100 UNIT/ML injection Commonly known as:  LANTUS Inject 30-40 Units into the skin See admin instructions. Inject 40 units SQ in the morning and inject 30 units SQ at bedtime   levalbuterol 45 MCG/ACT inhaler Commonly known as:  XOPENEX HFA Inhale 1 puff into the lungs every 6 (six) hours as needed for wheezing or shortness of breath.   Magnesium 250 MG Tabs Take 250 mg by mouth daily.   meclizine 12.5 MG tablet Commonly  known as:  ANTIVERT Take 12.5 mg by mouth 3 (three) times daily as needed for dizziness.   metFORMIN 1000 MG tablet Commonly known as:  GLUCOPHAGE Take 1,000 mg by mouth 2 (two) times daily with a meal.   metoprolol succinate 100 MG 24 hr tablet Commonly known as:  TOPROL-XL Take 1/2 tablet (50mg ) by mouth daily. What changed:    how much to take  how to take this  when to take this  additional instructions   nitroGLYCERIN 0.4 MG SL tablet Commonly known as:  NITROSTAT Place 0.4 mg under the tongue every 5 (five) minutes as needed for chest pain.   pantoprazole 40 MG tablet Commonly known as:  PROTONIX Take 1 tablet (40 mg total) by mouth daily. To protect stomach while taking multiple blood thinners.   predniSONE 20 MG tablet Commonly known as:  DELTASONE Take 1 tablet (20 mg total) by mouth daily with breakfast. Start taking on:  05/21/2017   tiotropium 18 MCG inhalation capsule Commonly known as:  SPIRIVA Place 1 capsule (18 mcg total) into inhaler and inhale daily.   traZODone 100 MG tablet Commonly known as:  DESYREL Take 100 mg by mouth at bedtime.            Durable Medical Equipment  (From admission, onward)        Start     Ordered   05/19/17 1252  For home use only DME oxygen  Once    Question Answer Comment  Mode or (Route) Nasal cannula   Liters per Minute 2   Frequency Continuous (stationary and portable oxygen unit needed)   Oxygen delivery system Gas      05/19/17 1251       Allergies:  Allergies  Allergen Reactions  . Codeine Shortness Of Breath  . Heparin Other (See Comments)    +HIT,  Severe bleeding   . Oxycodone Other (See Comments)    "Made me act out of my mind"    Outstanding Labs/Studies   Recommend OP f/u of Hgb  Duration of Discharge Encounter   Greater than 30 minutes including physician time.  Signed, Daune Perch PA-C 05/20/2017, 8:39 AM

## 2017-05-19 NOTE — Progress Notes (Addendum)
I called in to patient room to finalize instructions for home O2, and patient was audibly wheezing every few words (worse than this AM). Spoke with Dr. Claiborne Billings -> will consult pulm for recs. Dayna Dunn PA-C Agree with above, COPD exacerbation. Pulmonary to see today and probably keep overnight for improved stability prior to discharge. Shelva Majestic, MD

## 2017-05-20 DIAGNOSIS — I2510 Atherosclerotic heart disease of native coronary artery without angina pectoris: Secondary | ICD-10-CM

## 2017-05-20 LAB — CBC
HEMATOCRIT: 36.1 % — AB (ref 39.0–52.0)
HEMOGLOBIN: 11.6 g/dL — AB (ref 13.0–17.0)
MCH: 27.6 pg (ref 26.0–34.0)
MCHC: 32.1 g/dL (ref 30.0–36.0)
MCV: 86 fL (ref 78.0–100.0)
Platelets: 195 10*3/uL (ref 150–400)
RBC: 4.2 MIL/uL — ABNORMAL LOW (ref 4.22–5.81)
RDW: 15.5 % (ref 11.5–15.5)
WBC: 6.9 10*3/uL (ref 4.0–10.5)

## 2017-05-20 LAB — GLUCOSE, CAPILLARY: Glucose-Capillary: 183 mg/dL — ABNORMAL HIGH (ref 65–99)

## 2017-05-20 LAB — BASIC METABOLIC PANEL
Anion gap: 9 (ref 5–15)
BUN: 11 mg/dL (ref 6–20)
CHLORIDE: 102 mmol/L (ref 101–111)
CO2: 23 mmol/L (ref 22–32)
Calcium: 8.8 mg/dL — ABNORMAL LOW (ref 8.9–10.3)
Creatinine, Ser: 0.82 mg/dL (ref 0.61–1.24)
GFR calc Af Amer: >60 mL/min (ref >60)
GFR calc non Af Amer: >60 mL/min (ref >60)
GLUCOSE: 219 mg/dL — AB (ref 65–99)
POTASSIUM: 4.1 mmol/L (ref 3.5–5.1)
Sodium: 134 mmol/L — ABNORMAL LOW (ref 135–145)

## 2017-05-20 MED ORDER — ALBUTEROL SULFATE (2.5 MG/3ML) 0.083% IN NEBU: 3.0000 mL | INHALATION_SOLUTION | RESPIRATORY_TRACT | 12 refills | Status: DC | PRN

## 2017-05-20 MED ORDER — PREDNISONE 20 MG PO TABS: 20.0000 mg | ORAL_TABLET | Freq: Every day | ORAL | 0 refills | Status: DC

## 2017-05-20 MED ORDER — TIOTROPIUM BROMIDE MONOHYDRATE 18 MCG IN CAPS: 18.0000 ug | ORAL_CAPSULE | Freq: Every day | RESPIRATORY_TRACT | 12 refills | Status: DC

## 2017-05-20 NOTE — Progress Notes (Signed)
CARDIAC REHAB PHASE I   PRE:  Rate/Rhythm: 72 SR  BP:  Supine: 133/38  Sitting:   Standing:    SaO2: 97 2L 96 RA  MODE:  Ambulation: 500 ft   POST:  Rate/Rhythm:   BP:  Supine:   Sitting: 201/93 recheck after rest 174/77  Standing:    SaO2: 94-97 RA [id number] On arrival pt in bed on O2 2L. O2 discontinued and room air sat 96%. Assisted X 1 to ambulate and used walker.  Pt walked on room air and saturations monitored throughout walk. Room air sats 94-97%. Pt is less SOB today. Reported saturations to the NP and MD. BP after walk 201/93 pt rested and BP rechecked 174/77. Also reported BP to NP.  Rodney Langton RN 05/20/2017 9:13 AM

## 2017-05-22 ENCOUNTER — Telehealth: Payer: Self-pay | Admitting: *Deleted

## 2017-05-22 ENCOUNTER — Other Ambulatory Visit: Payer: Self-pay | Admitting: Cardiology

## 2017-05-22 DIAGNOSIS — J441 Chronic obstructive pulmonary disease with (acute) exacerbation: Secondary | ICD-10-CM

## 2017-05-22 NOTE — Telephone Encounter (Signed)
Orders placed for referral - will forward to schedulers

## 2017-05-22 NOTE — Telephone Encounter (Signed)
-----   Message from Arnoldo Lenis, MD sent at 05/22/2017  1:52 PM EST ----- Regarding: RE: Pulm referral Yes that's fine, please refer to Dr Luan Pulling for COPD  J BrancH MD ----- Message ----- From: Massie Maroon, CMA Sent: 05/19/2017   4:25 PM To: Arnoldo Lenis, MD Subject: FW: Pulm referral                              Ok to place referral? ----- Message ----- From: Delfino Lovett T Sent: 05/19/2017   4:21 PM To: Massie Maroon, CMA Subject: FW: Pulm referral                              Can you help with this?  ----- Message ----- From: Charlie Pitter, PA-C Sent: 05/19/2017  10:40 AM To: Chanda Busing Subject: Pulm referral                                  Hi Vicky, we saw this patient in the hospital and he has likely COPD. Needs outpatient pulm referral. Can you send this to whichever nurse in the [place] office typically helps facilitate outpatient things like this? It's a [facility] patient. Dayna Dunn PA-C

## 2017-05-23 DIAGNOSIS — I2510 Atherosclerotic heart disease of native coronary artery without angina pectoris: Secondary | ICD-10-CM | POA: Diagnosis present

## 2017-05-24 ENCOUNTER — Other Ambulatory Visit: Payer: Self-pay | Admitting: Cardiology

## 2017-05-25 ENCOUNTER — Ambulatory Visit (HOSPITAL_COMMUNITY): Admit: 2017-05-25 | Payer: Medicare HMO | Admitting: Cardiovascular Disease

## 2017-05-25 ENCOUNTER — Encounter (HOSPITAL_COMMUNITY): Payer: Self-pay

## 2017-05-25 SURGERY — RIGHT/LEFT HEART CATH AND CORONARY ANGIOGRAPHY
Anesthesia: LOCAL

## 2017-05-30 ENCOUNTER — Encounter: Payer: Self-pay | Admitting: Physician Assistant

## 2017-05-30 ENCOUNTER — Ambulatory Visit: Payer: Medicare HMO | Admitting: Physician Assistant

## 2017-05-30 VITALS — BP 142/62 | HR 53 | Ht 67.0 in | Wt 216.7 lb

## 2017-05-30 DIAGNOSIS — I480 Paroxysmal atrial fibrillation: Secondary | ICD-10-CM

## 2017-05-30 DIAGNOSIS — D649 Anemia, unspecified: Secondary | ICD-10-CM

## 2017-05-30 DIAGNOSIS — R001 Bradycardia, unspecified: Secondary | ICD-10-CM

## 2017-05-30 DIAGNOSIS — I25119 Atherosclerotic heart disease of native coronary artery with unspecified angina pectoris: Secondary | ICD-10-CM | POA: Diagnosis not present

## 2017-05-30 DIAGNOSIS — I5032 Chronic diastolic (congestive) heart failure: Secondary | ICD-10-CM

## 2017-05-30 DIAGNOSIS — R42 Dizziness and giddiness: Secondary | ICD-10-CM | POA: Diagnosis not present

## 2017-05-30 DIAGNOSIS — I10 Essential (primary) hypertension: Secondary | ICD-10-CM | POA: Diagnosis not present

## 2017-05-30 DIAGNOSIS — E118 Type 2 diabetes mellitus with unspecified complications: Secondary | ICD-10-CM

## 2017-05-30 DIAGNOSIS — J449 Chronic obstructive pulmonary disease, unspecified: Secondary | ICD-10-CM | POA: Diagnosis not present

## 2017-05-30 MED ORDER — MECLIZINE HCL 12.5 MG PO TABS: 12.5000 mg | ORAL_TABLET | Freq: Three times a day (TID) | ORAL | 0 refills | Status: DC | PRN

## 2017-05-30 MED ORDER — AMLODIPINE BESYLATE 10 MG PO TABS: 10.0000 mg | ORAL_TABLET | Freq: Every day | ORAL | 3 refills | Status: DC

## 2017-05-30 NOTE — Progress Notes (Signed)
Cardiology Office Note:    Date:  05/30/2017   ID:  Mike Wright, Mike Wright 1933-07-23, MRN [id number]  PCP:  Deloria Lair., MD  Cardiologist:  Carlyle Dolly, MD   Referring MD: Deloria Lair., MD   Chief Complaint  Patient presents with  . Hospitalization Follow-up    Status post cardiac catheterization, PCI    History of Present Illness:    Mike Wright is a 84 y.o. male with coronary artery disease, atrial fibrillation, diastolic heart failure, hypertension, aortic stenosis (mild by echo in March 2017, mean gradient 10 by echo 5/18), chronic kidney disease, diabetes.  Cardiac catheterization in 2012 demonstrated nonobstructive disease.  Stress testing in July 2018 was low risk without ischemia.  He was recently seen by Dr. [name] in the [facility] office with worsening chest discomfort felt to represent angina.  Therefore, cardiac catheterization was arranged.  Unfortunately, he had recurrent chest pain and presented to the emergency room for evaluation.  He was admitted 2/20-2/23.  Cardiac enzymes remain negative.  Cardiac catheterization 05/18/17 demonstrated high-grade proximal and mid LAD disease as well as high-grade first diagonal disease.  This was treated with drug-eluting stent x2 to the LAD and drug-eluting stent to the first diagonal.  Aspirin, clopidogrel, apixaban was recommended for a total of 30 days, then stop aspirin.  Ideally, the patient should remain on clopidogrel for 12 months.  However, clopidogrel could be stopped after 6 months if the patient has any issues with bleeding.  His diltiazem was discontinued upon admission secondary to bradycardia.  He was evaluated by pulmonology (Dr. Lake Bells) for COPD exacerbation.  He was placed on Spiriva and prednisone.  Mike Wright returns for follow-up.  He is here alone.  Since discharge, he has felt much better.  He has had 3 episodes of chest discomfort requiring nitroglycerin with relief.  His breathing is much improved.  He  denies syncope, orthopnea, PND or edema.  He has gotten dizzy with standing quickly.  He denies any bleeding issues.  Prior CV studies:   The following studies were reviewed today:  Cardiac catheterization 05/18/17 LAD proximal 70, mid 75, D1 75 RCA proximal 25, distal 40 EF 55-65 PCI:  4 x 12 mm Synergy DES to the proximal LAD 3.5 x 38 mm Synergy DES to the mid LAD (overlapping) 2.25 x 12 mm Synergy DES to the D1  Nuclear stress test 10/07/16  There was no ST segment deviation noted during stress.  The study is normal. There are no perfusion defects consistent with prior infarct or current ischemia.  This is a low risk study.  The left ventricular ejection fraction is normal (55-65%).  Echo 5/18 ([facility]) Moderate concentric LVH, EF >46, diastolic dysfunction, AV mean gradient 10, moderate LAE, mild RAE  Event monitor 06/20/16  Rhythm is atrial fibrillation throughout study. Occasioanal PVCs  Min HR 51, Max HR 107, Avg HR 67  Reported symptoms correspond with rate controlled atrial fibrillation.  Echo 05/2015 Mild to moderate LVH, EF 65-70, normal wall motion, mild aortic stenosis (mean 13), mild AI, moderate LAE  Past Medical History:  Diagnosis Date  . Anemia    a. mild, noted 04/2017.  [name] CAD in native artery    a. [name] 04/2017 s/p DES to D1, DES to prox-mid LAD, DES to prox LAD overlapping the prior stent, LVEF 55-65%.   . Chronic diastolic CHF (congestive heart failure) ([place])   . Essential hypertension, benign   . GERD (gastroesophageal reflux disease)   .  Heart murmur   . History of hiatal hernia   . HIT (heparin-induced thrombocytopenia) ([place])   . Hypoxia    a. went home on home O2 04/2017.  . Mixed hyperlipidemia   . PAF (paroxysmal atrial fibrillation) ([place])   . PSVT (paroxysmal supraventricular tachycardia) ([place])   . Renal insufficiency   . Sinus bradycardia    a. HR 30s-40s in 04/2017 -> diltiazem stopped, metoprolol reduced.  . Sleep apnea    "chose not  to order CPAP at this time" (05/18/2017)  . Type 2 diabetes mellitus ([place])   . Wheezing    a. suspected COPD 04/2017. Former tobacco x 40 years.    Past Surgical History:  Procedure Laterality Date  . APPENDECTOMY    . BACK SURGERY    . CARDIAC CATHETERIZATION  1980s; 2012;  . CORONARY ANGIOPLASTY WITH STENT PLACEMENT  05/18/2017   "3 stents"  . CORONARY STENT INTERVENTION N/A 05/18/2017   Procedure: CORONARY STENT INTERVENTION;  Surgeon: Jettie Booze, MD;  Location: [place] [facility];  Service: Cardiovascular;  Laterality: N/A;  . JOINT REPLACEMENT    . LEFT HEART CATH AND CORONARY ANGIOGRAPHY N/A 05/18/2017   Procedure: LEFT HEART CATH AND CORONARY ANGIOGRAPHY;  Surgeon: Jettie Booze, MD;  Location: [place] [facility];  Service: Cardiovascular;  Laterality: N/A;  . POSTERIOR LUMBAR FUSION    . RHINOPLASTY    . RIGHT HEART CATH N/A 05/18/2017   Procedure: RIGHT HEART CATH;  Surgeon: Jettie Booze, MD;  Location: [place] [facility];  Service: Cardiovascular;  Laterality: N/A;  . TOTAL KNEE ARTHROPLASTY Bilateral     Current Medications: Current Meds  Medication Sig  . albuterol (PROVENTIL) (2.5 MG/3ML) 0.083% nebulizer solution Inhale 3 mLs into the lungs every 4 (four) hours as needed for wheezing or shortness of breath.  [name] apixaban (ELIQUIS) 5 MG TABS tablet Take 1 tablet (5 mg total) by mouth 2 (two) times daily.  [name] aspirin EC 81 MG EC tablet Take 1 tablet (81 mg total) by mouth daily. For 30 days then STOP.  [name] atorvastatin (LIPITOR) 80 MG tablet Take 1 tablet (80 mg total) by mouth every evening.  . Cholecalciferol (D3-1000 PO) Take 2,000 Units by mouth daily.  . clopidogrel (PLAVIX) 75 MG tablet Take 1 tablet (75 mg total) by mouth daily.  . Cyanocobalamin (B-12) 2500 MCG TABS Take 2,500 mcg by mouth daily.   . finasteride (PROSCAR) 5 MG tablet Take 5 mg by mouth daily.   . furosemide (LASIX) 20 MG tablet Take 20 mg by mouth daily as needed for fluid.   [name]  insulin glargine (LANTUS) 100 UNIT/ML injection Inject 30-40 Units into the skin See admin instructions. Inject 40 units SQ in the morning and inject 30 units SQ at bedtime  . levalbuterol (XOPENEX HFA) 45 MCG/ACT inhaler Inhale 1 puff into the lungs every 6 (six) hours as needed for wheezing or shortness of breath.  . Magnesium 250 MG TABS Take 250 mg by mouth daily.   . meclizine (ANTIVERT) 12.5 MG tablet Take 1 tablet (12.5 mg total) by mouth 3 (three) times daily as needed for dizziness.  . metFORMIN (GLUCOPHAGE) 1000 MG tablet Take 1,000 mg by mouth 2 (two) times daily with a meal.    . metoprolol succinate (TOPROL-XL) 100 MG 24 hr tablet TAKE 1/2 TABLET EVERY DAY  . nitroGLYCERIN (NITROSTAT) 0.4 MG SL tablet PLACE 1 TABLET (0.4 MG TOTAL) UNDER THE TONGUE EVERY 5  MINUTES AS NEEDED  FOR CHEST PAIN.  [name] pantoprazole (PROTONIX) 40 MG tablet Take 1 tablet (40 mg total) by mouth daily. To protect stomach while taking multiple blood thinners.  . tiotropium (SPIRIVA) 18 MCG inhalation capsule Place 1 capsule (18 mcg total) into inhaler and inhale daily.  . traZODone (DESYREL) 100 MG tablet Take 100 mg by mouth at bedtime.   . [DISCONTINUED] amLODipine (NORVASC) 5 MG tablet Take 1 tablet (5 mg total) by mouth daily.     Allergies:   Codeine; Heparin; and Oxycodone   Social History   Tobacco Use  . Smoking status: Former Smoker    Packs/day: 2.00    Years: 30.00    Pack years: 60.00    Types: Cigarettes    Start date: 03/28/1953    Last attempt to quit: 03/29/1983    Years since quitting: 34.1  . Smokeless tobacco: Never Used  Substance Use Topics  . Alcohol use: Yes    Comment: 05/18/2017 'I drink a beer q yr"  . Drug use: No     Family Hx: The patient's family history includes Heart attack (age of onset: 77) in his father.  ROS:   Please see the history of present illness.    Review of Systems  Cardiovascular: Positive for dyspnea on exertion and irregular heartbeat.  Respiratory:  Positive for cough and shortness of breath.   Gastrointestinal: Positive for abdominal pain.   All other systems reviewed and are negative.   EKGs/Labs/Other Test Reviewed:    EKG:  EKG is  ordered today.  The ekg ordered today demonstrates sinus bradycardia, heart rate 53, normal axis, T wave inversion 1, aVL, PAC, QTC 412 ms, similar to prior tracings  Recent Labs: 05/17/2017: B Natriuretic Peptide 318.5 05/19/2017: ALT 13 05/20/2017: BUN 11; Creatinine, Ser 0.82; Hemoglobin 11.6; Platelets 195; Potassium 4.1; Sodium 134   Recent Lipid Panel Lab Results  Component Value Date/Time   CHOL 106 05/18/2017 02:46 AM   TRIG 119 05/18/2017 02:46 AM   HDL 17 (L) 05/18/2017 02:46 AM   CHOLHDL 6.2 05/18/2017 02:46 AM   LDLCALC 65 05/18/2017 02:46 AM    Physical Exam:    VS:  BP (!) 142/62   Pulse (!) 53   Ht 5\' 7"  (1.702 m)   Wt 216 lb 11.2 oz (98.3 kg)   SpO2 96%   BMI 33.94 kg/m     Wt Readings from Last 3 Encounters:  05/30/17 216 lb 11.2 oz (98.3 kg)  05/20/17 214 lb 11.7 oz (97.4 kg)  05/16/17 221 lb (100.2 kg)     Physical Exam  Constitutional: He is oriented to person, place, and time. He appears well-developed and well-nourished.  HENT:  Head: Normocephalic and atraumatic.  Neck: No JVD present.  Cardiovascular: Normal rate. A regularly irregular rhythm present.  Murmur heard.  Harsh systolic murmur is present with a grade of 2/6 at the upper right sternal border. Pulmonary/Chest: Effort normal. He has no rales.  Abdominal: Soft.  Musculoskeletal: He exhibits no edema.  Right wrist without hematoma  Neurological: He is alert and oriented to person, place, and time.  Skin: Skin is warm and dry.    ASSESSMENT & PLAN:    #1.  Coronary artery disease with angina pectoris ([place])  Status post recent PCI with DES x2 to the LAD and DES x1 to the D1.  Overall, he is doing very well.  He has had 3 episodes of chest discomfort that required nitroglycerin with prompt relief.   However, his  chest symptoms prior to PCI were much worse.  Also, his breathing is much improved.  His ECG does not demonstrate any acute findings.  The cath report does demonstrate a diagonal vessel that comes off in the area of stenting in the LAD.  Question if this area was jailed and contributing to some anginal symptoms.  We discussed continued medical therapy.  I reviewed his cardiac catheterization report with him.  -Continue aspirin, clopidogrel in addition to Apixaban  -DC aspirin after 06/18/17 (30 days after PCI)  -Increase amlodipine to 10 mg daily  -I have encouraged him to pursue cardiac rehabilitation  -Continue statin, beta-blocker.  -Continue clopidogrel for 12 months (could discontinue after 6 months if + bleeding issues)  #2.  PAF (paroxysmal atrial fibrillation) (HCC) CHADS2-VASc=6 (HTN, DM, CHF, CAD, agex2).  Currently maintaining normal sinus rhythm.  Continue Apixaban for anticoagulation.  Check BMET, CBC today.  #3.  Essential hypertension Blood pressure above target.  Increase amlodipine as noted.  #4.  Sinus bradycardia Diltiazem discontinued in the hospital.  Heart rate is currently stable on metoprolol succinate 50 mg daily.  #5.  Chronic diastolic CHF (congestive heart failure) (HCC) Volume status stable.  He takes Lasix as needed.  #6.  Chronic obstructive pulmonary disease, unspecified COPD type ([place]) He has follow-up pending with Dr. Lake Bells later this month.  #7.  Anemia, unspecified type  Hemoglobin noted to be decreased in the hospital.  Arrange follow-up CBC today.  #8.  Diabetes mellitus Consider empagliflozin given results of the EMPA-REG OUTCOME trial. This will be per his PCP.  #9.  Vertigo He asked for his meclizine to be refilled.  I agreed to send this in for 1 month but future refills will be per primary care.   Dispo:  Return in about 4 weeks (around 06/27/2017) for Scheduled Follow Up w/ Dr. [name].   Medication Adjustments/Labs and Tests  Ordered: Current medicines are reviewed at length with the patient today.  Concerns regarding medicines are outlined above.  Tests Ordered: Orders Placed This Encounter  Procedures  . Basic Metabolic Panel (BMET)  . CBC  . EKG 12-Lead   Medication Changes: Meds ordered this encounter  Medications  . amLODipine (NORVASC) 10 MG tablet    Sig: Take 1 tablet (10 mg total) by mouth daily.    Dispense:  90 tablet    Refill:  3  . meclizine (ANTIVERT) 12.5 MG tablet    Sig: Take 1 tablet (12.5 mg total) by mouth 3 (three) times daily as needed for dizziness.    Dispense:  30 tablet    Refill:  0    Order Specific Question:   Supervising Provider    Answer:   Jettie Booze [[id number]]    Signed, Richardson Dopp, PA-C  05/30/2017 3:34 PM    [facility] [address], [place], [place]  [address] Phone: [phone]; Fax: [phone]

## 2017-05-30 NOTE — Patient Instructions (Signed)
Medication Instructions:  1. INCREASE NORVASC TO 10 MG DAILY; NEW RX HAS BEEN SENT IN  2. AFTER 06/18/17 YOU WILL STOP ASPIRIN  Labwork: TODAY BMET, CBC   Testing/Procedures: NONE ORDRED  Follow-Up: KEEP YOUR UPCOMING APPT WITH DR. BRANCH IN THE [facility] OFFICE ON 06/27/17  Any Other Special Instructions Will Be Listed Below (If Applicable).     If you need a refill on your cardiac medications before your next appointment, please call your pharmacy.

## 2017-05-31 ENCOUNTER — Telehealth: Payer: Self-pay | Admitting: *Deleted

## 2017-05-31 LAB — CBC
HEMATOCRIT: 37.7 % (ref 37.5–51.0)
Hemoglobin: 12.3 g/dL — ABNORMAL LOW (ref 13.0–17.7)
MCH: 28.1 pg (ref 26.6–33.0)
MCHC: 32.6 g/dL (ref 31.5–35.7)
MCV: 86 fL (ref 79–97)
Platelets: 212 10*3/uL (ref 150–379)
RBC: 4.37 x10E6/uL (ref 4.14–5.80)
RDW: 16 % — AB (ref 12.3–15.4)
WBC: 9.6 10*3/uL (ref 3.4–10.8)

## 2017-05-31 LAB — BASIC METABOLIC PANEL
BUN/Creatinine Ratio: 15 (ref 10–24)
BUN: 13 mg/dL (ref 8–27)
CHLORIDE: 100 mmol/L (ref 96–106)
CO2: 23 mmol/L (ref 20–29)
CREATININE: 0.84 mg/dL (ref 0.76–1.27)
Calcium: 8.7 mg/dL (ref 8.6–10.2)
GFR calc non Af Amer: 82 mL/min/{1.73_m2} (ref >59)
GFR, EST AFRICAN AMERICAN: 95 mL/min/{1.73_m2} (ref >59)
Glucose: 65 mg/dL (ref 65–99)
Potassium: 4.1 mmol/L (ref 3.5–5.2)
Sodium: 139 mmol/L (ref 134–144)

## 2017-05-31 NOTE — Telephone Encounter (Signed)
Pt has been notified of lab results by phone with verbal understanding. Pt thanked me for my call.  

## 2017-05-31 NOTE — Telephone Encounter (Signed)
-----   Message from Liliane Shi, PA-C sent at 05/31/2017  9:18 AM EST ----- Renal function normal.  Potassium normal.  Hemoglobin stable. Continue current medications and follow up as planned.  Richardson Dopp, PA-C    05/31/2017 9:17 AM

## 2017-05-31 NOTE — Telephone Encounter (Signed)
Left message to go over lab results.  

## 2017-06-02 ENCOUNTER — Other Ambulatory Visit: Payer: Self-pay

## 2017-06-02 MED ORDER — AMLODIPINE BESYLATE 10 MG PO TABS: 10.0000 mg | ORAL_TABLET | Freq: Every day | ORAL | 3 refills | Status: DC

## 2017-06-05 MED ORDER — AMLODIPINE BESYLATE 10 MG PO TABS: 10.0000 mg | ORAL_TABLET | Freq: Every day | ORAL | 3 refills | Status: DC

## 2017-06-05 NOTE — Addendum Note (Signed)
Addended by: Derl Barrow on: 06/05/2017 10:59 AM   Modules accepted: Orders

## 2017-06-23 ENCOUNTER — Ambulatory Visit: Payer: Medicare HMO | Admitting: Pulmonary Disease

## 2017-06-23 ENCOUNTER — Encounter: Payer: Self-pay | Admitting: Pulmonary Disease

## 2017-06-23 VITALS — BP 124/66 | HR 55 | Ht 67.0 in | Wt 222.0 lb

## 2017-06-23 DIAGNOSIS — R0600 Dyspnea, unspecified: Secondary | ICD-10-CM | POA: Diagnosis not present

## 2017-06-23 DIAGNOSIS — J449 Chronic obstructive pulmonary disease, unspecified: Secondary | ICD-10-CM | POA: Diagnosis not present

## 2017-06-23 NOTE — Patient Instructions (Signed)
COPD: Today's lung function test showed that you have fairly mild airflow obstruction Continue Spiriva 1 puff daily no matter how you feel Continue albuterol as needed for chest tightness wheezing or shortness of breath  Neuromuscular weakness: Talk to your physical trainer at the [facility] about a weight training program  I think if you lose some weight it will help with your sensation of shortness of breath: The following behaviors have been associated with weight loss: Weigh yourself daily Write down everything you eat Drink a glass of water prior to eating a meal Only eat when you are hungry Buy food from the periphery of the grocery store, not the middle  Follow up in 3 months or sooner if needed

## 2017-06-23 NOTE — Progress Notes (Signed)
Subjective:   PATIENT ID: Mike Wright GENDER: male DOB: 1933-08-30, MRN: [id number]  Synopsis: Referred in march 2019 after he was diagnosed with a COPD exacerbation while hospitalized in 04/2017 for CAD and chest pain.  He received a drug eluting stent on that admission for his CAD. He quit smoking in 1985 after starting at age 84 and smoked 2 ppd.   HPI  Chief Complaint  Patient presents with  . Consult    Referred by [name] for COPD.     I met [name] in the hospital when he had a COPD flare after a heart stent.  He took the prednisone and spiriva.  Dyspnea: > he says he can't walk very far without getting short of breath > he can walk about 300 yards prior to having to stop > when he feels short of breath he feels anxious, no chest tightness > he will use nitroglycerine when he feels tightness > the NTG will help > he doesn't feel like he wheezes much, but his girlfriend thinks that he does > he may wheeze more in the nighttime when the weather changes  CAD: > he is considering cardiac rehab  He is going to the [facility] for water aerobics and does the recumbant bike work.  He goes three days a week.    He struggles with weight loss.  He says his family has a history of weight problems.  He was able to lose weight down to 213 pounds, at home he has been weight in 218.   COPD: > taking Spiriva, he doesn't report any problems, he is rinses his mouth well after taking it >   Past Medical History:  Diagnosis Date  . Anemia    a. mild, noted 04/2017.  [name] CAD in native artery    a. [name] 04/2017 s/p DES to D1, DES to prox-mid LAD, DES to prox LAD overlapping the prior stent, LVEF 55-65%.   . Chronic diastolic CHF (congestive heart failure) ([place])   . Essential hypertension, benign   . GERD (gastroesophageal reflux disease)   . Heart murmur   . History of hiatal hernia   . HIT (heparin-induced thrombocytopenia) ([facility])   . Hypoxia    a. went home on home O2 04/2017.  .  Mixed hyperlipidemia   . PAF (paroxysmal atrial fibrillation) ([place])   . PSVT (paroxysmal supraventricular tachycardia) ([place])   . Renal insufficiency   . Sinus bradycardia    a. HR 30s-40s in 04/2017 -> diltiazem stopped, metoprolol reduced.  . Sleep apnea    "chose not to order CPAP at this time" (05/18/2017)  . Type 2 diabetes mellitus ([place])   . Wheezing    a. suspected COPD 04/2017. Former tobacco x 40 years.     Family History  Problem Relation Age of Onset  . Heart attack Father 104  . COPD Father   . COPD Mother      Social History   Socioeconomic History  . Marital status: Single    Spouse name: Not on file  . Number of children: Not on file  . Years of education: Not on file  . Highest education level: Not on file  Occupational History  . Not on file  Social Needs  . Financial resource strain: Not on file  . Food insecurity:    Worry: Not on file    Inability: Not on file  . Transportation needs:    Medical: Not on file  Non-medical: Not on file  Tobacco Use  . Smoking status: Former Smoker    Packs/day: 2.00    Years: 30.00    Pack years: 60.00    Types: Cigarettes    Start date: 03/28/1953    Last attempt to quit: 03/29/1983    Years since quitting: 34.2  . Smokeless tobacco: Never Used  Substance and Sexual Activity  . Alcohol use: Yes    Comment: 05/18/2017 'I drink a beer q yr"  . Drug use: No  . Sexual activity: Not on file  Lifestyle  . Physical activity:    Days per week: Not on file    Minutes per session: Not on file  . Stress: Not on file  Relationships  . Social connections:    Talks on phone: Not on file    Gets together: Not on file    Attends religious service: Not on file    Active member of club or organization: Not on file    Attends meetings of clubs or organizations: Not on file    Relationship status: Not on file  . Intimate partner violence:    Fear of current or ex partner: Not on file    Emotionally abused: Not on file     Physically abused: Not on file    Forced sexual activity: Not on file  Other Topics Concern  . Not on file  Social History Narrative  . Not on file     Allergies  Allergen Reactions  . Codeine Shortness Of Breath  . Heparin Other (See Comments)    +HIT,  Severe bleeding   . Oxycodone Other (See Comments)    "Made me act out of my mind"     Outpatient Medications Prior to Visit  Medication Sig Dispense Refill  . albuterol (PROVENTIL) (2.5 MG/3ML) 0.083% nebulizer solution Inhale 3 mLs into the lungs every 4 (four) hours as needed for wheezing or shortness of breath. 75 mL 12  . amLODipine (NORVASC) 10 MG tablet Take 1 tablet (10 mg total) by mouth daily. 90 tablet 3  . apixaban (ELIQUIS) 5 MG TABS tablet Take 1 tablet (5 mg total) by mouth 2 (two) times daily. 180 tablet 3  . aspirin EC 81 MG EC tablet Take 1 tablet (81 mg total) by mouth daily. For 30 days then STOP. 30 tablet 0  . atorvastatin (LIPITOR) 80 MG tablet Take 1 tablet (80 mg total) by mouth every evening. 30 tablet 0  . Cholecalciferol (D3-1000 PO) Take 2,000 Units by mouth daily.    . clopidogrel (PLAVIX) 75 MG tablet Take 1 tablet (75 mg total) by mouth daily. 30 tablet 0  . Cyanocobalamin (B-12) 2500 MCG TABS Take 2,500 mcg by mouth daily.     . finasteride (PROSCAR) 5 MG tablet Take 5 mg by mouth daily.     . furosemide (LASIX) 20 MG tablet Take 20 mg by mouth daily as needed for fluid.     [name] insulin glargine (LANTUS) 100 UNIT/ML injection Inject 30-40 Units into the skin See admin instructions. Inject 40 units SQ in the morning and inject 30 units SQ at bedtime    . levalbuterol (XOPENEX HFA) 45 MCG/ACT inhaler Inhale 1 puff into the lungs every 6 (six) hours as needed for wheezing or shortness of breath. 1 Inhaler 0  . Magnesium 250 MG TABS Take 250 mg by mouth daily.     . meclizine (ANTIVERT) 12.5 MG tablet Take 1 tablet (12.5 mg total) by mouth  3 (three) times daily as needed for dizziness. 30 tablet 0  .  metFORMIN (GLUCOPHAGE) 1000 MG tablet Take 1,000 mg by mouth 2 (two) times daily with a meal.      . metoprolol succinate (TOPROL-XL) 100 MG 24 hr tablet TAKE 1/2 TABLET EVERY DAY 45 tablet 1  . nitroGLYCERIN (NITROSTAT) 0.4 MG SL tablet PLACE 1 TABLET (0.4 MG TOTAL) UNDER THE TONGUE EVERY 5  MINUTES AS NEEDED FOR CHEST PAIN. 25 tablet 3  . pantoprazole (PROTONIX) 40 MG tablet Take 1 tablet (40 mg total) by mouth daily. To protect stomach while taking multiple blood thinners. 30 tablet 0  . tiotropium (SPIRIVA) 18 MCG inhalation capsule Place 1 capsule (18 mcg total) into inhaler and inhale daily. 30 capsule 12  . traZODone (DESYREL) 100 MG tablet Take 100 mg by mouth at bedtime.      No facility-administered medications prior to visit.     Review of Systems  Constitutional: Negative for chills, fever, malaise/fatigue and weight loss.  HENT: Negative for congestion, nosebleeds, sinus pain and sore throat.   Eyes: Negative for photophobia, pain and discharge.  Respiratory: Positive for shortness of breath. Negative for cough, hemoptysis, sputum production and wheezing.   Cardiovascular: Negative for chest pain, palpitations, orthopnea and leg swelling.  Gastrointestinal: Negative for abdominal pain, constipation, diarrhea, nausea and vomiting.  Genitourinary: Negative for dysuria, frequency, hematuria and urgency.  Musculoskeletal: Negative for back pain, joint pain, myalgias and neck pain.  Skin: Negative for itching and rash.  Neurological: Negative for tingling, tremors, sensory change, speech change, focal weakness, seizures, weakness and headaches.  Psychiatric/Behavioral: Negative for memory loss, substance abuse and suicidal ideas. The patient is not nervous/anxious.       Objective:  Physical Exam   Vitals:   06/23/17 1343  BP: 124/66  Pulse: (!) 55  SpO2: 96%  Weight: 222 lb (100.7 kg)  Height: _0  (1.702 m)   RA  Ambulated 500 feet on room air and his O2 saturation  remained at 94% or above  Gen: chronically ill appearing, no acute distress HENT: NCAT, OP clear, neck supple without masses Eyes: PERRL, EOMi Lymph: no cervical lymphadenopathy PULM: CTA B CV: RRR, no mgr, no JVD GI: BS+, soft, nontender, no hsm Derm: no rash or skin breakdown MSK: normal bulk and tone Neuro: A&Ox4, CN II-XII intact, strength 4/5 in all 4 extremities Psyche: normal mood and affect   CBC    Component Value Date/Time   WBC 9.6 05/30/2017 1531   WBC 6.9 05/20/2017 0325   RBC 4.37 05/30/2017 1531   RBC 4.20 (L) 05/20/2017 0325   HGB 12.3 (L) 05/30/2017 1531   HCT 37.7 05/30/2017 1531   PLT 212 05/30/2017 1531   MCV 86 05/30/2017 1531   MCH 28.1 05/30/2017 1531   MCH 27.6 05/20/2017 0325   MCHC 32.6 05/30/2017 1531   MCHC 32.1 05/20/2017 0325   RDW 16.0 (H) 05/30/2017 1531   LYMPHSABS 2.0 05/17/2017 1532   MONOABS 1.2 (H) 05/17/2017 1532   EOSABS 0.1 05/17/2017 1532   BASOSABS 0.0 05/17/2017 1532     Chest imaging: February 2019 chest x-ray images independently reviewed showing cardiomegaly, normal cardiac silhouette  PFT: 05/2017 Spirometry: Ratio 74% but shape of flow volume loop consistent with obstruction, FEV1 2.45L 83% pred  Labs:  Path:  Echo:  Heart Catheterization:       Assessment & Plan:   No diagnosis found.  Discussion: Today's spirometry test was not consistent with obstruction  by FEV1 to FVC ratio though I think some artifact interference is at play as the shape appears to be consistent with airflow obstruction his clinical syndrome is consistent with COPD.  Some might consider this a COPD class 0 or airflow obstruction grade 0.  Based on his recent exacerbation, history of smoking, and ongoing shortness of breath I think it is best to assume that he has COPD.  He has quit smoking which is important.  With constricting most of his shortness of breath is deconditioning and being overweight.  Neuromuscular weakness contributes to  his disease.  I was surprised today by his weakness on physical exam.  As this is asymmetric I doubt that this represents a focal neuro deficit.  The best approach moving forward is to get him enrolled in a more aggressive weight training program to see if he can have some improvement.  If he does not then we need to get neurology involved.  Certainly this contributes to his shortness of breath.  Plan: COPD: Today's lung function test showed that you have fairly mild airflow obstruction Continue Spiriva 1 puff daily no matter how you feel Continue albuterol as needed for chest tightness wheezing or shortness of breath  Neuromuscular weakness: Talk to your physical trainer at the [facility] about a weight training program  I think if you lose some weight it will help with your sensation of shortness of breath: The following behaviors have been associated with weight loss: Weigh yourself daily Write down everything you eat Drink a glass of water prior to eating a meal Only eat when you are hungry Buy food from the periphery of the grocery store, not the middle  Follow up in 3 months or sooner if needed   Current Outpatient Medications:  .  albuterol (PROVENTIL) (2.5 MG/3ML) 0.083% nebulizer solution, Inhale 3 mLs into the lungs every 4 (four) hours as needed for wheezing or shortness of breath., Disp: 75 mL, Rfl: 12 .  amLODipine (NORVASC) 10 MG tablet, Take 1 tablet (10 mg total) by mouth daily., Disp: 90 tablet, Rfl: 3 .  apixaban (ELIQUIS) 5 MG TABS tablet, Take 1 tablet (5 mg total) by mouth 2 (two) times daily., Disp: 180 tablet, Rfl: 3 .  aspirin EC 81 MG EC tablet, Take 1 tablet (81 mg total) by mouth daily. For 30 days then STOP., Disp: 30 tablet, Rfl: 0 .  atorvastatin (LIPITOR) 80 MG tablet, Take 1 tablet (80 mg total) by mouth every evening., Disp: 30 tablet, Rfl: 0 .  Cholecalciferol (D3-1000 PO), Take 2,000 Units by mouth daily., Disp: , Rfl:  .  clopidogrel (PLAVIX) 75 MG tablet,  Take 1 tablet (75 mg total) by mouth daily., Disp: 30 tablet, Rfl: 0 .  Cyanocobalamin (B-12) 2500 MCG TABS, Take 2,500 mcg by mouth daily. , Disp: , Rfl:  .  finasteride (PROSCAR) 5 MG tablet, Take 5 mg by mouth daily. , Disp: , Rfl:  .  furosemide (LASIX) 20 MG tablet, Take 20 mg by mouth daily as needed for fluid. , Disp: , Rfl:  .  insulin glargine (LANTUS) 100 UNIT/ML injection, Inject 30-40 Units into the skin See admin instructions. Inject 40 units SQ in the morning and inject 30 units SQ at bedtime, Disp: , Rfl:  .  levalbuterol (XOPENEX HFA) 45 MCG/ACT inhaler, Inhale 1 puff into the lungs every 6 (six) hours as needed for wheezing or shortness of breath., Disp: 1 Inhaler, Rfl: 0 .  Magnesium 250 MG TABS, Take 250  mg by mouth daily. , Disp: , Rfl:  .  meclizine (ANTIVERT) 12.5 MG tablet, Take 1 tablet (12.5 mg total) by mouth 3 (three) times daily as needed for dizziness., Disp: 30 tablet, Rfl: 0 .  metFORMIN (GLUCOPHAGE) 1000 MG tablet, Take 1,000 mg by mouth 2 (two) times daily with a meal.  , Disp: , Rfl:  .  metoprolol succinate (TOPROL-XL) 100 MG 24 hr tablet, TAKE 1/2 TABLET EVERY DAY, Disp: 45 tablet, Rfl: 1 .  nitroGLYCERIN (NITROSTAT) 0.4 MG SL tablet, PLACE 1 TABLET (0.4 MG TOTAL) UNDER THE TONGUE EVERY 5  MINUTES AS NEEDED FOR CHEST PAIN., Disp: 25 tablet, Rfl: 3 .  pantoprazole (PROTONIX) 40 MG tablet, Take 1 tablet (40 mg total) by mouth daily. To protect stomach while taking multiple blood thinners., Disp: 30 tablet, Rfl: 0 .  tiotropium (SPIRIVA) 18 MCG inhalation capsule, Place 1 capsule (18 mcg total) into inhaler and inhale daily., Disp: 30 capsule, Rfl: 12 .  traZODone (DESYREL) 100 MG tablet, Take 100 mg by mouth at bedtime. , Disp: , Rfl:

## 2017-06-27 ENCOUNTER — Ambulatory Visit: Payer: Medicare HMO | Admitting: Cardiology

## 2017-06-27 ENCOUNTER — Encounter: Payer: Self-pay | Admitting: Cardiology

## 2017-06-27 VITALS — BP 162/70 | HR 73 | Ht 67.0 in | Wt 220.0 lb

## 2017-06-27 DIAGNOSIS — I2510 Atherosclerotic heart disease of native coronary artery without angina pectoris: Secondary | ICD-10-CM

## 2017-06-27 DIAGNOSIS — I5032 Chronic diastolic (congestive) heart failure: Secondary | ICD-10-CM

## 2017-06-27 DIAGNOSIS — I10 Essential (primary) hypertension: Secondary | ICD-10-CM

## 2017-06-27 DIAGNOSIS — I480 Paroxysmal atrial fibrillation: Secondary | ICD-10-CM | POA: Diagnosis not present

## 2017-06-27 MED ORDER — LISINOPRIL 5 MG PO TABS: 5.0000 mg | ORAL_TABLET | Freq: Every day | ORAL | 1 refills | Status: DC

## 2017-06-27 NOTE — Patient Instructions (Signed)
Your physician recommends that you schedule a follow-up appointment in: [place] has recommended you make the following change in your medication:   START LISINOPRIL 5 MG DAILY  Your physician recommends that you return for lab work in: [place] has requested that you regularly monitor and record your blood pressure readings at home FOR 2 [place]. Please use the same machine at the same time of day to check your readings and record them to bring to your follow-up visit.  Thank you for choosing [facility]!!

## 2017-06-27 NOTE — Progress Notes (Signed)
Clinical Summary [name] is a 84 y.o.male seen today for follow up of the following medical problems.   1. CAD - nonobstructive CAD by cath Jan 2012, LVEF 60-65% by LV gram.  - 06/2015 nuclear stress without clear ischemia - 05/2015 echo LVEF 65-70%, no WMAs, cannot evaluate diastolic function 05/5327 lexiscan without ischemia, low risk study.    - last visit due to progressing chest and SOB/DOE he was referred for [facility] - cath 04/2017 as reported below. Received DES to 75% D1, DES to 75% mid to distal LAD, DES to 70% prox LAD RHC with CI 2.67, mean PA 25, no wedge reported by LVEDP 14.  - discharged on triple therapy with ASA, plavix, eliquis with plan for 30 days, then stop ASA.  - now off ASA.  - mild nonspecific chest pain at times. Breathing improving, some wheezing at times.  - compliant with meds.  - goes to [facility] 3 times a week. Does water exercises, walks on track. He wants to wait on cardiac rehab.    2. PSVT/Palpitations.  - no recent palpitations.   3. Afib - dilt stop during 04/2017 admission due to bradycardia. Metoptolol decreased due to wheezing during that admission - no palpitations.   4. Chronic diastolic HF - no recent symptoms   5. HTN - several med changes during 04/2017. Dilt 240 stopped, HCTZ stopped. Started on norvasc 5.  - home bp's 160s/80s - losartan caused SOB, he is unsure if he has been on lisinopril in the past  6. Aortic stenosis - mild by echo 05/2015. Mean grad 13, AVA VTI 1.5 - echo [facility] 07/2016 LVEF >65%, AVA VTI not reported, mean grad 71mmHg  - no recent symptoms  7. 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 Anemia    a. mild, noted 04/2017.  [name] CAD in native artery    a. [name] 04/2017 s/p DES to D1, DES to prox-mid LAD, DES to prox LAD overlapping the prior stent, LVEF 55-65%.   . Chronic  diastolic CHF (congestive heart failure) ([place])   . Essential hypertension, benign   . GERD (gastroesophageal reflux disease)   . Heart murmur   . History of hiatal hernia   . HIT (heparin-induced thrombocytopenia) ([place])   . Hypoxia    a. went home on home O2 04/2017.  . Mixed hyperlipidemia   . PAF (paroxysmal atrial fibrillation) ([place])   . PSVT (paroxysmal supraventricular tachycardia) ([place])   . Renal insufficiency   . Sinus bradycardia    a. HR 30s-40s in 04/2017 -> diltiazem stopped, metoprolol reduced.  . Sleep apnea    "chose not to order CPAP at this time" (05/18/2017)  . Type 2 diabetes mellitus ([place])   . Wheezing    a. suspected COPD 04/2017. Former tobacco x 40 years.     Allergies  Allergen Reactions  . Codeine Shortness Of Breath  . Heparin Other (See Comments)    +HIT,  Severe bleeding   . Oxycodone Other (See Comments)    "Made me act out of my mind"     Current Outpatient Medications  Medication Sig Dispense Refill  . albuterol (PROVENTIL) (2.5 MG/3ML) 0.083% nebulizer solution Inhale 3 mLs into the lungs every 4 (four) hours as needed for wheezing or shortness of breath. 75 mL 12  . amLODipine (NORVASC) 10 MG  tablet Take 1 tablet (10 mg total) by mouth daily. 90 tablet 3  . apixaban (ELIQUIS) 5 MG TABS tablet Take 1 tablet (5 mg total) by mouth 2 (two) times daily. 180 tablet 3  . aspirin EC 81 MG EC tablet Take 1 tablet (81 mg total) by mouth daily. For 30 days then STOP. 30 tablet 0  . atorvastatin (LIPITOR) 80 MG tablet Take 1 tablet (80 mg total) by mouth every evening. 30 tablet 0  . Cholecalciferol (D3-1000 PO) Take 2,000 Units by mouth daily.    . clopidogrel (PLAVIX) 75 MG tablet Take 1 tablet (75 mg total) by mouth daily. 30 tablet 0  . Cyanocobalamin (B-12) 2500 MCG TABS Take 2,500 mcg by mouth daily.     . finasteride (PROSCAR) 5 MG tablet Take 5 mg by mouth daily.     . furosemide (LASIX) 20 MG tablet Take 20 mg by mouth daily as needed for fluid.       [name] insulin glargine (LANTUS) 100 UNIT/ML injection Inject 30-40 Units into the skin See admin instructions. Inject 40 units SQ in the morning and inject 30 units SQ at bedtime    . levalbuterol (XOPENEX HFA) 45 MCG/ACT inhaler Inhale 1 puff into the lungs every 6 (six) hours as needed for wheezing or shortness of breath. 1 Inhaler 0  . Magnesium 250 MG TABS Take 250 mg by mouth daily.     . meclizine (ANTIVERT) 12.5 MG tablet Take 1 tablet (12.5 mg total) by mouth 3 (three) times daily as needed for dizziness. 30 tablet 0  . metFORMIN (GLUCOPHAGE) 1000 MG tablet Take 1,000 mg by mouth 2 (two) times daily with a meal.      . metoprolol succinate (TOPROL-XL) 100 MG 24 hr tablet TAKE 1/2 TABLET EVERY DAY 45 tablet 1  . nitroGLYCERIN (NITROSTAT) 0.4 MG SL tablet PLACE 1 TABLET (0.4 MG TOTAL) UNDER THE TONGUE EVERY 5  MINUTES AS NEEDED FOR CHEST PAIN. 25 tablet 3  . pantoprazole (PROTONIX) 40 MG tablet Take 1 tablet (40 mg total) by mouth daily. To protect stomach while taking multiple blood thinners. 30 tablet 0  . tiotropium (SPIRIVA) 18 MCG inhalation capsule Place 1 capsule (18 mcg total) into inhaler and inhale daily. 30 capsule 12  . traZODone (DESYREL) 100 MG tablet Take 100 mg by mouth at bedtime.      No current facility-administered medications for this visit.      Past Surgical History:  Procedure Laterality Date  . APPENDECTOMY    . BACK SURGERY    . CARDIAC CATHETERIZATION  1980s; 2012;  . CORONARY ANGIOPLASTY WITH STENT PLACEMENT  05/18/2017   "3 stents"  . CORONARY STENT INTERVENTION N/A 05/18/2017   Procedure: CORONARY STENT INTERVENTION;  Surgeon: Jettie Booze, MD;  Location: [place] [facility];  Service: Cardiovascular;  Laterality: N/A;  . JOINT REPLACEMENT    . LEFT HEART CATH AND CORONARY ANGIOGRAPHY N/A 05/18/2017   Procedure: LEFT HEART CATH AND CORONARY ANGIOGRAPHY;  Surgeon: Jettie Booze, MD;  Location: [place] [facility];  Service: Cardiovascular;   Laterality: N/A;  . POSTERIOR LUMBAR FUSION    . RHINOPLASTY    . RIGHT HEART CATH N/A 05/18/2017   Procedure: RIGHT HEART CATH;  Surgeon: Jettie Booze, MD;  Location: [place] [facility];  Service: Cardiovascular;  Laterality: N/A;  . TOTAL KNEE ARTHROPLASTY Bilateral      Allergies  Allergen Reactions  . Codeine Shortness Of Breath  . Heparin  Other (See Comments)    +HIT,  Severe bleeding   . Oxycodone Other (See Comments)    "Made me act out of my mind"      Family History  Problem Relation Age of Onset  . Heart attack Father 66  . COPD Father   . COPD Mother      Social History [name] reports that he quit smoking about 34 years ago. His smoking use included cigarettes. He started smoking about 64 years ago. He has a 60.00 pack-year smoking history. He has never used smokeless tobacco. [name] reports that he drinks alcohol.   Review of Systems CONSTITUTIONAL: No weight loss, fever, chills, weakness or fatigue.  HEENT: Eyes: No visual loss, blurred vision, double vision or yellow sclerae.No hearing loss, sneezing, congestion, runny nose or sore throat.  SKIN: No rash or itching.  CARDIOVASCULAR: per hpi RESPIRATORY: No shortness of breath, cough or sputum.  GASTROINTESTINAL: No anorexia, nausea, vomiting or diarrhea. No abdominal pain or blood.  GENITOURINARY: No burning on urination, no polyuria NEUROLOGICAL: No headache, dizziness, syncope, paralysis, ataxia, numbness or tingling in the extremities. No change in bowel or bladder control.  MUSCULOSKELETAL: No muscle, back pain, joint pain or stiffness.  LYMPHATICS: No enlarged nodes. No history of splenectomy.  PSYCHIATRIC: No history of depression or anxiety.  ENDOCRINOLOGIC: No reports of sweating, cold or heat intolerance. No polyuria or polydipsia.  [name]   Physical Examination Vitals:   06/27/17 1435  BP: (!) 162/70  Pulse: 73  SpO2: 97%   Vitals:   06/27/17 1435  Weight: 220 lb (99.8 kg)    Height: 5\' 7"  (1.702 m)    Gen: resting comfortably, no acute distress HEENT: no scleral icterus, pupils equal round and reactive, no palptable cervical adenopathy,  CV: RRR, 2/6 systolic murmur rusb, no jvd Resp: Clear to auscultation bilaterally GI: abdomen is soft, non-tender, non-distended, normal bowel sounds, no hepatosplenomegaly MSK: extremities are warm, no edema.  Skin: warm, no rash Neuro:  no focal deficits Psych: appropriate affect   Diagnostic Studies 05/2015 echo Study Conclusions  - Left ventricle: The cavity size was normal. Wall thickness was increased increased in a pattern of mild to moderate LVH. Systolic function was vigorous. The estimated ejection fraction was in the range of 65% to 70%. Wall motion was normal; there were no regional wall motion abnormalities. The study is not technically sufficient to allow evaluation of LV diastolic function. - Aortic valve: Moderately calcified annulus. Mildly thickened leaflets. There was mild stenosis. There was mild regurgitation. Mean gradient (S): 13 mm Hg. Valve area (VTI): 1.51 cm^2. - Mitral valve: Mildly calcified annulus. Normal thickness leaflets . - Left atrium: The atrium was moderately dilated. - Technically adequate study.   06/2015 Nuclear stress test  No diagnostic ST segment changes to indicate ischemia.  Small, mild intensity, perfusion defects noted in the apical anterior and basal inferolateral walls. This is most consistent with soft tissue attenuation given normal wall motion in these regions. No large ischemic zones noted.  This is a low risk study.  Nuclear stress EF: 64%.  05/2016 Event monitor  Rhythm is atrial fibrillation throughout study. Occasioanal PVCs  Min HR 51, Max HR 107, Avg HR 67  Reported symptoms correspond with rate controlled atrial fibrillation.  09/2016 nuclear stress  There was no ST segment deviation noted during stress.  The study is  normal. There are no perfusion defects consistent with prior infarct or current ischemia.  This is a low  risk study.  The left ventricular ejection fraction is normal (55-65%).   04/2017 cath  Dist RCA lesion is 40% stenosed.  Prox RCA lesion is 25% stenosed.  1st Diag lesion is 75% stenosed.  A drug-eluting stent was successfully placed using a STENT SYNERGY DES 2.25X12.  Post intervention, there is a 0% residual stenosis.  Prox LAD to Mid LAD lesion is 75% stenosed.  A drug-eluting stent was successfully placed using a STENT SYNERGY DES 3.5X38.  Post intervention, there is a 0% residual stenosis.  Prox LAD lesion is 70% stenosed.  A drug-eluting stent was successfully placed using a STENT SYNERGY DES 4X12, overlapping the prior stent.  Post intervention, there is a 0% residual stenosis.  The left ventricular systolic function is normal.  The left ventricular ejection fraction is 55-65% by visual estimate.  LV end diastolic pressure is normal.  There is no aortic valve stenosis.  Ao sat 98%, PA sat 65%; PA mean 25 mm Hg; unable to wedge the catheter.  Normal right heat pressures.  Tortuous right subclavian making catheter navigation difficult from the right radial approach.   Continue dual antiplatelet therapy along with Eliquis for 30 days.  Restart Eliquis tomorrow.  After 30 days, stop aspirin.  Continue Plavix and Eliquis.    After 6 months, can consider stopping clopidogrel if he has bleeding issues.  Given the nomber of stents, would try to complete one year of clopidogrel if no bleeding issues.   Assessment and Plan  1. CAD - recent stenting as described above.  - remains on plavix and eliquis in setting of recent stent and afib history - continue current meds   2. PSVT/Palpitations.  - no symptoms, continue current meds. Note his dilt was stopped during recent admission for bradycardia and beta blocker decreased due to some wheezing.     3. Afib -no symptoms, continue current meds. Recent med changes as described above.   4. Aortic stenosis - mild by last echo - continue to monitor  5. Chronic diastolic HF - no symptoms, continue current meds  6. HTN - above goal, several med changes during recent admission - prior side effects to ARBs. In setting of CAD try low dose lisinopril, check BMET in 2 weeks, bp log in 1 week.     F/u 3 months  Arnoldo Lenis, M.D.

## 2017-06-28 ENCOUNTER — Telehealth: Payer: Self-pay | Admitting: Pulmonary Disease

## 2017-06-28 DIAGNOSIS — J431 Panlobular emphysema: Secondary | ICD-10-CM

## 2017-06-28 NOTE — Telephone Encounter (Signed)
Pt is requesting an order to be sent to [facility] in [place] to d/c oxygen. Pt states he has not used oxygen since discharge 05/20/17.  He sated that oxygen levels are maintain 93-98% on roomair.   BQ please advise if  Okay to d/c oxygen. Thanks

## 2017-06-29 NOTE — Telephone Encounter (Signed)
Order has been placed to [facility] to d/c pt's home oxygen. Pt is aware and voiced his understanding. Nothing further is needed.

## 2017-06-29 NOTE — Telephone Encounter (Signed)
OK by me 

## 2017-07-02 ENCOUNTER — Encounter: Payer: Self-pay | Admitting: Cardiology

## 2017-08-07 ENCOUNTER — Telehealth: Payer: Self-pay | Admitting: *Deleted

## 2017-08-07 NOTE — Telephone Encounter (Signed)
-----   Message from Arnoldo Lenis, MD sent at 08/02/2017 11:31 AM EDT ----- Labs look good.Zandra Abts MD

## 2017-08-07 NOTE — Telephone Encounter (Signed)
Pt made aware

## 2017-08-14 ENCOUNTER — Other Ambulatory Visit: Payer: Self-pay | Admitting: Cardiology

## 2017-09-04 DIAGNOSIS — R42 Dizziness and giddiness: Secondary | ICD-10-CM | POA: Diagnosis not present

## 2017-09-04 DIAGNOSIS — Z79899 Other long term (current) drug therapy: Secondary | ICD-10-CM | POA: Diagnosis not present

## 2017-09-04 DIAGNOSIS — Z794 Long term (current) use of insulin: Secondary | ICD-10-CM | POA: Diagnosis not present

## 2017-09-04 DIAGNOSIS — S79912A Unspecified injury of left hip, initial encounter: Secondary | ICD-10-CM | POA: Diagnosis not present

## 2017-09-04 DIAGNOSIS — R1110 Vomiting, unspecified: Secondary | ICD-10-CM | POA: Diagnosis not present

## 2017-09-04 DIAGNOSIS — M25552 Pain in left hip: Secondary | ICD-10-CM | POA: Diagnosis not present

## 2017-09-04 DIAGNOSIS — Z7902 Long term (current) use of antithrombotics/antiplatelets: Secondary | ICD-10-CM | POA: Diagnosis not present

## 2017-09-04 DIAGNOSIS — X58XXXA Exposure to other specified factors, initial encounter: Secondary | ICD-10-CM | POA: Diagnosis not present

## 2017-09-04 DIAGNOSIS — R509 Fever, unspecified: Secondary | ICD-10-CM | POA: Diagnosis not present

## 2017-09-04 DIAGNOSIS — S7002XA Contusion of left hip, initial encounter: Secondary | ICD-10-CM | POA: Diagnosis not present

## 2017-09-04 DIAGNOSIS — I110 Hypertensive heart disease with heart failure: Secondary | ICD-10-CM | POA: Diagnosis not present

## 2017-09-04 DIAGNOSIS — R0602 Shortness of breath: Secondary | ICD-10-CM | POA: Diagnosis not present

## 2017-09-13 ENCOUNTER — Inpatient Hospital Stay (HOSPITAL_COMMUNITY)
Admission: EM | Admit: 2017-09-13 | Discharge: 2017-09-25 | DRG: 239 | Disposition: A | Discharge status: Skilled Nursing Facility | Payer: Medicare HMO | Attending: Student in an Organized Health Care Education/Training Program | Admitting: Student in an Organized Health Care Education/Training Program

## 2017-09-13 ENCOUNTER — Inpatient Hospital Stay (HOSPITAL_COMMUNITY): Payer: Medicare HMO

## 2017-09-13 ENCOUNTER — Encounter (HOSPITAL_COMMUNITY): Payer: Self-pay

## 2017-09-13 ENCOUNTER — Other Ambulatory Visit: Payer: Self-pay

## 2017-09-13 DIAGNOSIS — E039 Hypothyroidism, unspecified: Secondary | ICD-10-CM | POA: Diagnosis present

## 2017-09-13 DIAGNOSIS — E1152 Type 2 diabetes mellitus with diabetic peripheral angiopathy with gangrene: Secondary | ICD-10-CM | POA: Diagnosis not present

## 2017-09-13 DIAGNOSIS — I96 Gangrene, not elsewhere classified: Secondary | ICD-10-CM | POA: Diagnosis not present

## 2017-09-13 DIAGNOSIS — Z885 Allergy status to narcotic agent status: Secondary | ICD-10-CM

## 2017-09-13 DIAGNOSIS — E782 Mixed hyperlipidemia: Secondary | ICD-10-CM | POA: Diagnosis present

## 2017-09-13 DIAGNOSIS — I509 Heart failure, unspecified: Secondary | ICD-10-CM | POA: Diagnosis not present

## 2017-09-13 DIAGNOSIS — Z89412 Acquired absence of left great toe: Secondary | ICD-10-CM | POA: Diagnosis not present

## 2017-09-13 DIAGNOSIS — G4700 Insomnia, unspecified: Secondary | ICD-10-CM | POA: Diagnosis present

## 2017-09-13 DIAGNOSIS — K219 Gastro-esophageal reflux disease without esophagitis: Secondary | ICD-10-CM | POA: Diagnosis present

## 2017-09-13 DIAGNOSIS — I081 Rheumatic disorders of both mitral and tricuspid valves: Secondary | ICD-10-CM | POA: Diagnosis not present

## 2017-09-13 DIAGNOSIS — M6281 Muscle weakness (generalized): Secondary | ICD-10-CM | POA: Diagnosis not present

## 2017-09-13 DIAGNOSIS — Z820 Family history of epilepsy and other diseases of the nervous system: Secondary | ICD-10-CM

## 2017-09-13 DIAGNOSIS — I2510 Atherosclerotic heart disease of native coronary artery without angina pectoris: Secondary | ICD-10-CM | POA: Diagnosis present

## 2017-09-13 DIAGNOSIS — N138 Other obstructive and reflux uropathy: Secondary | ICD-10-CM | POA: Diagnosis not present

## 2017-09-13 DIAGNOSIS — I739 Peripheral vascular disease, unspecified: Secondary | ICD-10-CM | POA: Diagnosis not present

## 2017-09-13 DIAGNOSIS — Z7902 Long term (current) use of antithrombotics/antiplatelets: Secondary | ICD-10-CM | POA: Diagnosis not present

## 2017-09-13 DIAGNOSIS — Z9079 Acquired absence of other genital organ(s): Secondary | ICD-10-CM

## 2017-09-13 DIAGNOSIS — Y838 Other surgical procedures as the cause of abnormal reaction of the patient, or of later complication, without mention of misadventure at the time of the procedure: Secondary | ICD-10-CM | POA: Diagnosis not present

## 2017-09-13 DIAGNOSIS — Z888 Allergy status to other drugs, medicaments and biological substances status: Secondary | ICD-10-CM

## 2017-09-13 DIAGNOSIS — M2550 Pain in unspecified joint: Secondary | ICD-10-CM | POA: Diagnosis not present

## 2017-09-13 DIAGNOSIS — I471 Supraventricular tachycardia: Secondary | ICD-10-CM | POA: Diagnosis present

## 2017-09-13 DIAGNOSIS — Z79899 Other long term (current) drug therapy: Secondary | ICD-10-CM

## 2017-09-13 DIAGNOSIS — R339 Retention of urine, unspecified: Secondary | ICD-10-CM | POA: Diagnosis not present

## 2017-09-13 DIAGNOSIS — I5032 Chronic diastolic (congestive) heart failure: Secondary | ICD-10-CM | POA: Diagnosis not present

## 2017-09-13 DIAGNOSIS — D62 Acute posthemorrhagic anemia: Secondary | ICD-10-CM | POA: Diagnosis not present

## 2017-09-13 DIAGNOSIS — Z823 Family history of stroke: Secondary | ICD-10-CM

## 2017-09-13 DIAGNOSIS — J449 Chronic obstructive pulmonary disease, unspecified: Secondary | ICD-10-CM | POA: Diagnosis not present

## 2017-09-13 DIAGNOSIS — R2689 Other abnormalities of gait and mobility: Secondary | ICD-10-CM | POA: Diagnosis not present

## 2017-09-13 DIAGNOSIS — Z981 Arthrodesis status: Secondary | ICD-10-CM | POA: Diagnosis not present

## 2017-09-13 DIAGNOSIS — Z89411 Acquired absence of right great toe: Secondary | ICD-10-CM | POA: Diagnosis not present

## 2017-09-13 DIAGNOSIS — R011 Cardiac murmur, unspecified: Secondary | ICD-10-CM | POA: Diagnosis not present

## 2017-09-13 DIAGNOSIS — M47812 Spondylosis without myelopathy or radiculopathy, cervical region: Secondary | ICD-10-CM | POA: Diagnosis present

## 2017-09-13 DIAGNOSIS — I5033 Acute on chronic diastolic (congestive) heart failure: Secondary | ICD-10-CM | POA: Diagnosis present

## 2017-09-13 DIAGNOSIS — G4730 Sleep apnea, unspecified: Secondary | ICD-10-CM | POA: Diagnosis present

## 2017-09-13 DIAGNOSIS — M19072 Primary osteoarthritis, left ankle and foot: Secondary | ICD-10-CM | POA: Diagnosis not present

## 2017-09-13 DIAGNOSIS — I361 Nonrheumatic tricuspid (valve) insufficiency: Secondary | ICD-10-CM | POA: Diagnosis not present

## 2017-09-13 DIAGNOSIS — Z794 Long term (current) use of insulin: Secondary | ICD-10-CM

## 2017-09-13 DIAGNOSIS — Z6831 Body mass index (BMI) 31.0-31.9, adult: Secondary | ICD-10-CM | POA: Diagnosis not present

## 2017-09-13 DIAGNOSIS — E119 Type 2 diabetes mellitus without complications: Secondary | ICD-10-CM

## 2017-09-13 DIAGNOSIS — Z833 Family history of diabetes mellitus: Secondary | ICD-10-CM

## 2017-09-13 DIAGNOSIS — B9562 Methicillin resistant Staphylococcus aureus infection as the cause of diseases classified elsewhere: Secondary | ICD-10-CM | POA: Diagnosis not present

## 2017-09-13 DIAGNOSIS — D649 Anemia, unspecified: Secondary | ICD-10-CM | POA: Diagnosis not present

## 2017-09-13 DIAGNOSIS — L03115 Cellulitis of right lower limb: Secondary | ICD-10-CM | POA: Diagnosis not present

## 2017-09-13 DIAGNOSIS — I480 Paroxysmal atrial fibrillation: Secondary | ICD-10-CM | POA: Diagnosis present

## 2017-09-13 DIAGNOSIS — I70263 Atherosclerosis of native arteries of extremities with gangrene, bilateral legs: Secondary | ICD-10-CM | POA: Diagnosis not present

## 2017-09-13 DIAGNOSIS — Z96653 Presence of artificial knee joint, bilateral: Secondary | ICD-10-CM | POA: Diagnosis present

## 2017-09-13 DIAGNOSIS — I110 Hypertensive heart disease with heart failure: Secondary | ICD-10-CM | POA: Diagnosis not present

## 2017-09-13 DIAGNOSIS — Z7901 Long term (current) use of anticoagulants: Secondary | ICD-10-CM | POA: Diagnosis not present

## 2017-09-13 DIAGNOSIS — L97519 Non-pressure chronic ulcer of other part of right foot with unspecified severity: Secondary | ICD-10-CM | POA: Diagnosis not present

## 2017-09-13 DIAGNOSIS — Z825 Family history of asthma and other chronic lower respiratory diseases: Secondary | ICD-10-CM

## 2017-09-13 DIAGNOSIS — I340 Nonrheumatic mitral (valve) insufficiency: Secondary | ICD-10-CM | POA: Diagnosis not present

## 2017-09-13 DIAGNOSIS — I10 Essential (primary) hypertension: Secondary | ICD-10-CM | POA: Diagnosis not present

## 2017-09-13 DIAGNOSIS — Z299 Encounter for prophylactic measures, unspecified: Secondary | ICD-10-CM | POA: Diagnosis not present

## 2017-09-13 DIAGNOSIS — E1142 Type 2 diabetes mellitus with diabetic polyneuropathy: Secondary | ICD-10-CM | POA: Diagnosis not present

## 2017-09-13 DIAGNOSIS — Z8249 Family history of ischemic heart disease and other diseases of the circulatory system: Secondary | ICD-10-CM

## 2017-09-13 DIAGNOSIS — E1165 Type 2 diabetes mellitus with hyperglycemia: Secondary | ICD-10-CM | POA: Diagnosis not present

## 2017-09-13 DIAGNOSIS — R7881 Bacteremia: Secondary | ICD-10-CM | POA: Diagnosis not present

## 2017-09-13 DIAGNOSIS — J810 Acute pulmonary edema: Secondary | ICD-10-CM | POA: Diagnosis not present

## 2017-09-13 DIAGNOSIS — L0390 Cellulitis, unspecified: Secondary | ICD-10-CM

## 2017-09-13 DIAGNOSIS — Z87891 Personal history of nicotine dependence: Secondary | ICD-10-CM | POA: Diagnosis not present

## 2017-09-13 DIAGNOSIS — Y92238 Other place in hospital as the place of occurrence of the external cause: Secondary | ICD-10-CM | POA: Diagnosis not present

## 2017-09-13 DIAGNOSIS — I97638 Postprocedural hematoma of a circulatory system organ or structure following other circulatory system procedure: Secondary | ICD-10-CM | POA: Diagnosis not present

## 2017-09-13 DIAGNOSIS — Z9889 Other specified postprocedural states: Secondary | ICD-10-CM | POA: Diagnosis not present

## 2017-09-13 DIAGNOSIS — Z9049 Acquired absence of other specified parts of digestive tract: Secondary | ICD-10-CM | POA: Diagnosis not present

## 2017-09-13 DIAGNOSIS — Z7401 Bed confinement status: Secondary | ICD-10-CM | POA: Diagnosis not present

## 2017-09-13 DIAGNOSIS — L03116 Cellulitis of left lower limb: Secondary | ICD-10-CM

## 2017-09-13 DIAGNOSIS — Z955 Presence of coronary angioplasty implant and graft: Secondary | ICD-10-CM | POA: Diagnosis not present

## 2017-09-13 DIAGNOSIS — Z9842 Cataract extraction status, left eye: Secondary | ICD-10-CM

## 2017-09-13 DIAGNOSIS — Z4781 Encounter for orthopedic aftercare following surgical amputation: Secondary | ICD-10-CM | POA: Diagnosis not present

## 2017-09-13 DIAGNOSIS — J811 Chronic pulmonary edema: Secondary | ICD-10-CM | POA: Diagnosis not present

## 2017-09-13 LAB — URINALYSIS, ROUTINE W REFLEX MICROSCOPIC
BILIRUBIN URINE: NEGATIVE
Bacteria, UA: NONE SEEN
GLUCOSE, UA: NEGATIVE mg/dL
HGB URINE DIPSTICK: NEGATIVE
Ketones, ur: NEGATIVE mg/dL
LEUKOCYTES UA: NEGATIVE
NITRITE: NEGATIVE
Protein, ur: 30 mg/dL — AB
SPECIFIC GRAVITY, URINE: 1.017 (ref 1.005–1.030)
pH: 7 (ref 5.0–8.0)

## 2017-09-13 LAB — COMPREHENSIVE METABOLIC PANEL
ALT: 46 U/L (ref 17–63)
AST: 46 U/L — AB (ref 15–41)
Albumin: 2.8 g/dL — ABNORMAL LOW (ref 3.5–5.0)
Alkaline Phosphatase: 125 U/L (ref 38–126)
Anion gap: 14 (ref 5–15)
BUN: 12 mg/dL (ref 6–20)
CHLORIDE: 97 mmol/L — AB (ref 101–111)
CO2: 26 mmol/L (ref 22–32)
CREATININE: 1.03 mg/dL (ref 0.61–1.24)
Calcium: 8.6 mg/dL — ABNORMAL LOW (ref 8.9–10.3)
GFR calc Af Amer: >60 mL/min (ref >60)
Glucose, Bld: 197 mg/dL — ABNORMAL HIGH (ref 65–99)
POTASSIUM: 3.8 mmol/L (ref 3.5–5.1)
Sodium: 137 mmol/L (ref 135–145)
Total Bilirubin: 0.9 mg/dL (ref 0.3–1.2)
Total Protein: 7 g/dL (ref 6.5–8.1)

## 2017-09-13 LAB — CBC WITH DIFFERENTIAL/PLATELET
ABS IMMATURE GRANULOCYTES: 0.6 10*3/uL — AB (ref 0.0–0.1)
BASOS PCT: 0 %
Basophils Absolute: 0.1 10*3/uL (ref 0.0–0.1)
Eosinophils Absolute: 0 10*3/uL (ref 0.0–0.7)
Eosinophils Relative: 0 %
HCT: 32.1 % — ABNORMAL LOW (ref 39.0–52.0)
HEMOGLOBIN: 10 g/dL — AB (ref 13.0–17.0)
IMMATURE GRANULOCYTES: 3 %
LYMPHS PCT: 6 %
Lymphs Abs: 1.1 10*3/uL (ref 0.7–4.0)
MCH: 25.5 pg — AB (ref 26.0–34.0)
MCHC: 31.2 g/dL (ref 30.0–36.0)
MCV: 81.9 fL (ref 78.0–100.0)
MONO ABS: 2.9 10*3/uL — AB (ref 0.1–1.0)
MONOS PCT: 15 %
NEUTROS PCT: 76 %
Neutro Abs: 14.3 10*3/uL — ABNORMAL HIGH (ref 1.7–7.7)
PLATELETS: 376 10*3/uL (ref 150–400)
RBC: 3.92 MIL/uL — ABNORMAL LOW (ref 4.22–5.81)
RDW: 16.8 % — ABNORMAL HIGH (ref 11.5–15.5)
WBC: 19 10*3/uL — ABNORMAL HIGH (ref 4.0–10.5)

## 2017-09-13 LAB — PROTIME-INR
INR: 1.43
PROTHROMBIN TIME: 17.3 s — AB (ref 11.4–15.2)

## 2017-09-13 LAB — I-STAT CG4 LACTIC ACID, ED: LACTIC ACID, VENOUS: 2.42 mmol/L — AB (ref 0.5–1.9)

## 2017-09-13 LAB — GLUCOSE, CAPILLARY: Glucose-Capillary: 199 mg/dL — ABNORMAL HIGH (ref 65–99)

## 2017-09-13 MED ORDER — AMLODIPINE BESYLATE 10 MG PO TABS
10.0000 mg | ORAL_TABLET | Freq: Every day | ORAL | Status: DC
  Administered 2017-09-13 – 2017-09-25 (×11): 10 mg via ORAL
  Filled 2017-09-13 (×7): qty 1
  Filled 2017-09-13 (×2): qty 2
  Filled 2017-09-13 (×2): qty 1

## 2017-09-13 MED ORDER — INSULIN GLARGINE 100 UNIT/ML ~~LOC~~ SOLN
30.0000 [IU] | Freq: Two times a day (BID) | SUBCUTANEOUS | Status: DC
  Administered 2017-09-13 – 2017-09-14 (×2): 30 [IU] via SUBCUTANEOUS
  Filled 2017-09-13 (×2): qty 0.3

## 2017-09-13 MED ORDER — B-12 2500 MCG PO TABS: 2500.0000 ug | ORAL_TABLET | Freq: Every day | ORAL | Status: DC

## 2017-09-13 MED ORDER — METOPROLOL SUCCINATE ER 100 MG PO TB24
100.0000 mg | ORAL_TABLET | Freq: Every day | ORAL | Status: DC
  Administered 2017-09-13 – 2017-09-25 (×11): 100 mg via ORAL
  Filled 2017-09-13 (×12): qty 1

## 2017-09-13 MED ORDER — NITROGLYCERIN 0.4 MG SL SUBL: 0.4000 mg | SUBLINGUAL_TABLET | SUBLINGUAL | Status: DC | PRN

## 2017-09-13 MED ORDER — LISINOPRIL 5 MG PO TABS
5.0000 mg | ORAL_TABLET | Freq: Every day | ORAL | Status: DC
  Administered 2017-09-13 – 2017-09-25 (×11): 5 mg via ORAL
  Filled 2017-09-13: qty 2
  Filled 2017-09-13 (×8): qty 1
  Filled 2017-09-13: qty 2
  Filled 2017-09-13: qty 1

## 2017-09-13 MED ORDER — ACETAMINOPHEN 650 MG RE SUPP: 650.0000 mg | Freq: Four times a day (QID) | RECTAL | Status: DC | PRN

## 2017-09-13 MED ORDER — SENNOSIDES-DOCUSATE SODIUM 8.6-50 MG PO TABS
1.0000 | ORAL_TABLET | Freq: Every day | ORAL | Status: DC
  Administered 2017-09-13 – 2017-09-24 (×12): 1 via ORAL
  Filled 2017-09-13 (×12): qty 1

## 2017-09-13 MED ORDER — ATORVASTATIN CALCIUM 80 MG PO TABS
80.0000 mg | ORAL_TABLET | Freq: Every evening | ORAL | Status: DC
  Administered 2017-09-13 – 2017-09-16 (×4): 80 mg via ORAL
  Filled 2017-09-13 (×2): qty 1
  Filled 2017-09-13 (×2): qty 4
  Filled 2017-09-13: qty 1

## 2017-09-13 MED ORDER — VITAMIN B-12 1000 MCG PO TABS
2500.0000 ug | ORAL_TABLET | Freq: Every day | ORAL | Status: DC
  Administered 2017-09-14 – 2017-09-22 (×7): 2500 ug via ORAL
  Administered 2017-09-23 – 2017-09-24 (×2): 2.5 ug via ORAL
  Administered 2017-09-25: 2500 ug via ORAL
  Filled 2017-09-13 (×10): qty 3

## 2017-09-13 MED ORDER — ALBUTEROL SULFATE (2.5 MG/3ML) 0.083% IN NEBU
3.0000 mL | INHALATION_SOLUTION | RESPIRATORY_TRACT | Status: DC | PRN
  Administered 2017-09-15: 3 mL via RESPIRATORY_TRACT
  Filled 2017-09-13: qty 3

## 2017-09-13 MED ORDER — TIOTROPIUM BROMIDE MONOHYDRATE 18 MCG IN CAPS
18.0000 ug | ORAL_CAPSULE | Freq: Every day | RESPIRATORY_TRACT | Status: DC
  Administered 2017-09-13 – 2017-09-25 (×12): 18 ug via RESPIRATORY_TRACT
  Filled 2017-09-13 (×3): qty 5

## 2017-09-13 MED ORDER — LEVALBUTEROL TARTRATE 45 MCG/ACT IN AERO: 1.0000 | INHALATION_SPRAY | Freq: Four times a day (QID) | RESPIRATORY_TRACT | Status: DC | PRN

## 2017-09-13 MED ORDER — ACETAMINOPHEN 325 MG PO TABS: 650.0000 mg | ORAL_TABLET | Freq: Four times a day (QID) | ORAL | Status: DC | PRN

## 2017-09-13 MED ORDER — PANTOPRAZOLE SODIUM 40 MG PO TBEC
40.0000 mg | DELAYED_RELEASE_TABLET | Freq: Every day | ORAL | Status: DC
  Administered 2017-09-13 – 2017-09-25 (×11): 40 mg via ORAL
  Filled 2017-09-13 (×11): qty 1

## 2017-09-13 MED ORDER — CLOPIDOGREL BISULFATE 75 MG PO TABS
75.0000 mg | ORAL_TABLET | Freq: Every day | ORAL | Status: DC
  Administered 2017-09-13 – 2017-09-25 (×11): 75 mg via ORAL
  Filled 2017-09-13 (×11): qty 1

## 2017-09-13 MED ORDER — SODIUM CHLORIDE 0.9 % IV SOLN
2.0000 g | INTRAVENOUS | Status: DC
  Administered 2017-09-13: 2 g via INTRAVENOUS
  Filled 2017-09-13: qty 20

## 2017-09-13 MED ORDER — TRAZODONE HCL 100 MG PO TABS
100.0000 mg | ORAL_TABLET | Freq: Every day | ORAL | Status: DC
  Administered 2017-09-13 – 2017-09-24 (×12): 100 mg via ORAL
  Filled 2017-09-13 (×5): qty 1
  Filled 2017-09-13: qty 2
  Filled 2017-09-13 (×4): qty 1
  Filled 2017-09-13: qty 2
  Filled 2017-09-13: qty 1

## 2017-09-13 NOTE — ED Notes (Signed)
Pt is unable to provide a sample of urine at this time. Pt is aware that a urine sample is needed. Will try again later.

## 2017-09-13 NOTE — ED Triage Notes (Signed)
Pt endorses having bilateral gangrenous big toes. Both toes appear black with green drainage, sent here by pcp. Tachy in triage, afebrile.

## 2017-09-13 NOTE — ED Provider Notes (Addendum)
[facility] Provider Note  CSN: [id number] Arrival date & time: 09/13/17  1135    History   Chief Complaint Chief Complaint  Patient presents with  . Wound Infection    HPI Mike Wright is a 84 y.o. male with a medical history of Type 2 DM, HTN, GERD, a-fib, PVD and COPD who presented to the ED via his PCP for bilateral foot infections. Patient states he had "blood blisters" on his great toes bilaterally since 06/2017 and was managed primarily by his PCP. He has not been seen by a wound clinic. Toes have progressively become more discolored and today are black with redness on his foot and ankles. Patient has difficulty ambulating and has had a couple of falls as a result of this.   Past Medical History:  Diagnosis Date  . Anemia    a. mild, noted 04/2017.  [name] CAD in native artery    a. [name] 04/2017 s/p DES to D1, DES to prox-mid LAD, DES to prox LAD overlapping the prior stent, LVEF 55-65%.   . Chronic diastolic CHF (congestive heart failure) ([place])   . Diabetic ulcer of toe ([place])   . DJD (degenerative joint disease) of cervical spine   . Essential hypertension, benign   . GERD (gastroesophageal reflux disease)   . Heart murmur   . History of hiatal hernia   . HIT (heparin-induced thrombocytopenia) ([place])   . Hypothyroidism   . Hypoxia    a. went home on home O2 04/2017.  [name] Insomnia   . Mixed hyperlipidemia   . PAD (peripheral artery disease) ([place])   . PAF (paroxysmal atrial fibrillation) ([place])   . PSVT (paroxysmal supraventricular tachycardia) ([place])   . PVD (peripheral vascular disease) ([place])   . Renal insufficiency   . 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  . Sinus bradycardia    a. HR 30s-40s in 04/2017 -> diltiazem stopped, metoprolol reduced.  . Sleep apnea    "chose not to order CPAP at this time" (05/18/2017)  . Type 2 diabetes mellitus ([place])   . Wheezing    a. suspected COPD 04/2017. Former tobacco x 40 years.    Patient  Active Problem List   Diagnosis Date Noted  . Gangrene ([place]) 09/13/2017  . Hypoxia 05/19/2017  . COPD (chronic obstructive pulmonary disease) ([place]) 05/19/2017  . Anemia   . CAD (coronary artery disease)   . Chronic diastolic CHF (congestive heart failure) ([place])   . PSVT (paroxysmal supraventricular tachycardia) ([place])   . PAF (paroxysmal atrial fibrillation) ([place])   . Sinus bradycardia   . Sleep apnea   . Wheezing   . Type 2 diabetes mellitus ([place])   . Status post coronary artery stent placement   . Essential hypertension 04/15/2010  . Paroxysmal supraventricular tachycardia ([place]) 04/15/2010  . Diabetes ([place]) 04/14/2010  . SHORTNESS OF BREATH 04/14/2010    Past Surgical History:  Procedure Laterality Date  . APPENDECTOMY    . BACK SURGERY    . CARDIAC CATHETERIZATION  1980s; 2012;  . CATARACT EXTRACTION Left 2004  . COLONOSCOPY  2004   FLEISHMAN TICS  . CORONARY ANGIOPLASTY WITH STENT PLACEMENT  05/18/2017   "3 stents"  . CORONARY STENT INTERVENTION N/A 05/18/2017   Procedure: CORONARY STENT INTERVENTION;  Surgeon: Jettie Booze, MD;  Location: [facility];  Service: Cardiovascular;  Laterality: N/A;  . JOINT REPLACEMENT    . LEFT HEART CATH AND CORONARY ANGIOGRAPHY N/A 05/18/2017  Procedure: LEFT HEART CATH AND CORONARY ANGIOGRAPHY;  Surgeon: Jettie Booze, MD;  Location: [facility];  Service: Cardiovascular;  Laterality: N/A;  . LUMBAR FUSION  2002   L3, 4 L4, 5 L5 S1 Fused by Dr. Glenna Fellows  . POSTERIOR LUMBAR FUSION    . RHINOPLASTY    . RIGHT HEART CATH N/A 05/18/2017   Procedure: RIGHT HEART CATH;  Surgeon: Jettie Booze, MD;  Location: [facility];  Service: Cardiovascular;  Laterality: N/A;  . TOTAL KNEE ARTHROPLASTY Bilateral   . TRANSURETHRAL RESECTION OF PROSTATE  2001   [facility] Medications    Prior to Admission medications   Medication Sig Start Date End Date Taking? Authorizing Provider    acetaminophen (TYLENOL) 500 MG tablet Take 500 mg by mouth as needed for mild pain or headache.   Yes [provider]  albuterol (PROVENTIL) (2.5 MG/3ML) 0.083% nebulizer solution Inhale 3 mLs into the lungs every 4 (four) hours as needed for wheezing or shortness of breath. 05/20/17  Yes Daune Perch, NP  amLODipine (NORVASC) 10 MG tablet Take 1 tablet (10 mg total) by mouth daily. 06/05/17  Yes Branch, Alphonse Guild, MD  apixaban (ELIQUIS) 5 MG TABS tablet Take 1 tablet (5 mg total) by mouth 2 (two) times daily. 11/23/15  Yes Arnoldo Lenis, MD  atorvastatin (LIPITOR) 80 MG tablet Take 1 tablet (80 mg total) by mouth every evening. 05/19/17  Yes Dunn, Nedra Hai, PA-C  Cholecalciferol (D3-1000 PO) Take 2,000 Units by mouth daily.   Yes [provider]  clopidogrel (PLAVIX) 75 MG tablet Take 1 tablet (75 mg total) by mouth daily. 05/19/17  Yes Dunn, Dayna N, PA-C  Cyanocobalamin (B-12) 2500 MCG TABS Take 2,500 mcg by mouth daily.    Yes [provider]  finasteride (PROSCAR) 5 MG tablet Take 5 mg by mouth daily.  04/17/15  Yes [provider]  furosemide (LASIX) 20 MG tablet TAKE 1 TABLET DAILY AS NEEDED 08/15/17  Yes Branch, Alphonse Guild, MD  hydrochlorothiazide (HYDRODIURIL) 25 MG tablet Take 25 mg by mouth daily.   Yes [provider]  insulin glargine (LANTUS) 100 UNIT/ML injection Inject 30-40 Units into the skin See admin instructions. Inject 40 units SQ in the morning and inject 30 units SQ at bedtime   Yes [provider]  levalbuterol (XOPENEX HFA) 45 MCG/ACT inhaler Inhale 1 puff into the lungs every 6 (six) hours as needed for wheezing or shortness of breath. 05/19/17 05/19/18 Yes Dunn, Dayna N, PA-C  lisinopril (PRINIVIL,ZESTRIL) 5 MG tablet Take 1 tablet (5 mg total) by mouth daily. 06/27/17 09/25/17 Yes BranchAlphonse Guild, MD  Magnesium 250 MG TABS Take 250 mg by mouth daily.    Yes [provider]  meclizine (ANTIVERT) 12.5 MG tablet  Take 1 tablet (12.5 mg total) by mouth 3 (three) times daily as needed for dizziness. 05/30/17  Yes Weaver, Scott T, PA-C  metFORMIN (GLUCOPHAGE) 1000 MG tablet Take 1,000 mg by mouth 2 (two) times daily with a meal.     Yes [provider]  metoprolol succinate (TOPROL-XL) 100 MG 24 hr tablet TAKE 1/2 TABLET EVERY DAY 05/24/17  Yes Branch, Alphonse Guild, MD  nitroGLYCERIN (NITROSTAT) 0.4 MG SL tablet PLACE 1 TABLET (0.4 MG TOTAL) UNDER THE TONGUE EVERY 5  MINUTES AS NEEDED FOR CHEST PAIN. 05/22/17  Yes Arnoldo Lenis, MD  pantoprazole (PROTONIX) 40 MG tablet Take 1 tablet (  40 mg total) by mouth daily. To protect stomach while taking multiple blood thinners. 05/19/17  Yes Dunn, Dayna N, PA-C  psyllium (METAMUCIL) 58.6 % packet Take 1 packet by mouth daily.   Yes [provider]  tiotropium (SPIRIVA) 18 MCG inhalation capsule Place 1 capsule (18 mcg total) into inhaler and inhale daily. 05/20/17  Yes Daune Perch, NP  traZODone (DESYREL) 100 MG tablet Take 100 mg by mouth at bedtime.  03/25/16  Yes [provider]    Family History Family History  Problem Relation Age of Onset  . Heart attack Father 30  . COPD Father   . COPD Mother   . Heart disease Mother   . Diabetes Mother   . Hypertension Sister   . CVA Sister   . Diabetes Sister   . Multiple sclerosis Sister     Social History Social History   Tobacco Use  . Smoking status: Former Smoker    Packs/day: 2.00    Years: 30.00    Pack years: 60.00    Types: Cigarettes    Start date: 03/28/1953    Last attempt to quit: 03/29/1983    Years since quitting: 34.4  . Smokeless tobacco: Never Used  Substance Use Topics  . Alcohol use: Yes    Comment: 05/18/2017 'I drink a beer q yr"  . Drug use: No     Allergies   Codeine; Heparin; Losartan; Oxycodone; and Other   Review of Systems Review of Systems  Constitutional: Negative for activity change, chills, diaphoresis, fatigue and fever.  Eyes: Positive  for visual disturbance.       Decreased vision in left eye.  Respiratory: Negative for cough, chest tightness, shortness of breath and wheezing.   Cardiovascular: Negative for chest pain, palpitations and leg swelling.  Gastrointestinal: Negative for abdominal pain, constipation, diarrhea, nausea and vomiting.  Endocrine: Negative.   Genitourinary: Negative for dysuria.  Musculoskeletal: Positive for gait problem and joint swelling.  Skin: Positive for color change and wound.  Neurological: Positive for numbness. Negative for dizziness, syncope, weakness, light-headedness and headaches.  Hematological: Negative.      Physical Exam Updated Vital Signs BP 134/64   Pulse 92   Temp 98.8 F (37.1 C) (Oral)   Resp 19   Ht 5\' 7"  (1.702 m)   Wt 94.3 kg (208 lb)   SpO2 95%   BMI 32.58 kg/m   Physical Exam  Constitutional: He appears well-developed and well-nourished. He is cooperative.  Non-toxic appearance. He does not have a sickly appearance. No distress.  HENT:  Head: Normocephalic and atraumatic.  Eyes: Pupils are equal, round, and reactive to light. Conjunctivae and EOM are normal.  Neck: Normal range of motion. Neck supple.  Cardiovascular: Normal rate and intact distal pulses. An irregularly irregular rhythm present.  Murmur heard. Pulses:      Dorsalis pedis pulses are 1+ on the right side, and 1+ on the left side.       Posterior tibial pulses are 1+ on the right side, and 1+ on the left side.  Pulmonary/Chest: Effort normal and breath sounds normal.  Abdominal: Soft. Bowel sounds are normal.  Musculoskeletal:       Right ankle: He exhibits swelling.       Left ankle: He exhibits swelling.       Right foot: There is swelling.       Left foot: There is swelling.  No motor ability in great toes bilaterally.  Feet:  Right Foot:  Skin Integrity: Positive for ulcer, erythema and warmth.  Left Foot:  Skin Integrity: Positive for ulcer, erythema and warmth.  Neurological:  He is alert. He has normal strength. A sensory deficit is present. No cranial nerve deficit. He exhibits normal muscle tone.  No sensation in lower extremities bilaterally to the mid-shin. Strength is intact. Patient ambulates with cane.  Skin: Skin is warm and dry. Capillary refill takes less than 2 seconds.  1+ edema at least to mid-shin in lower extremities bilaterally.  Psychiatric: He has a normal mood and affect. His behavior is normal.  Nursing note and vitals reviewed.        ED Treatments / Results  Labs (all labs ordered are listed, but only abnormal results are displayed) Labs Reviewed  COMPREHENSIVE METABOLIC PANEL - Abnormal; Notable for the following components:      Result Value   Chloride 97 (*)    Glucose, Bld 197 (*)    Calcium 8.6 (*)    Albumin 2.8 (*)    AST 46 (*)    All other components within normal limits  CBC WITH DIFFERENTIAL/PLATELET - Abnormal; Notable for the following components:   WBC 19.0 (*)    RBC 3.92 (*)    Hemoglobin 10.0 (*)    HCT 32.1 (*)    MCH 25.5 (*)    RDW 16.8 (*)    Neutro Abs 14.3 (*)    Monocytes Absolute 2.9 (*)    Abs Immature Granulocytes 0.6 (*)    All other components within normal limits  PROTIME-INR - Abnormal; Notable for the following components:   Prothrombin Time 17.3 (*)    All other components within normal limits  I-STAT CG4 LACTIC ACID, ED - Abnormal; Notable for the following components:   Lactic Acid, Venous 2.42 (*)    All other components within normal limits  CULTURE, BLOOD (ROUTINE X 2)  CULTURE, BLOOD (ROUTINE X 2)  URINALYSIS, ROUTINE W REFLEX MICROSCOPIC  I-STAT CG4 LACTIC ACID, ED    EKG None  Radiology No results found.  Procedures Procedures (including critical care time)  Medications Ordered in ED Medications  senna-docusate (Senokot-S) tablet 1 tablet (has no administration in time range)  acetaminophen (TYLENOL) tablet 650 mg (has no administration in time range)    Or    acetaminophen (TYLENOL) suppository 650 mg (has no administration in time range)  albuterol (PROVENTIL) (2.5 MG/3ML) 0.083% nebulizer solution 3 mL (has no administration in time range)  amLODipine (NORVASC) tablet 10 mg (has no administration in time range)  atorvastatin (LIPITOR) tablet 80 mg (has no administration in time range)  B-12 TABS 2,500 mcg (has no administration in time range)  levalbuterol (XOPENEX HFA) inhaler 1 puff (has no administration in time range)  insulin glargine (LANTUS) injection 30 Units (has no administration in time range)  lisinopril (PRINIVIL,ZESTRIL) tablet 5 mg (has no administration in time range)  metoprolol succinate (TOPROL-XL) 24 hr tablet 100 mg (has no administration in time range)  nitroGLYCERIN (NITROSTAT) SL tablet 0.4 mg (has no administration in time range)  pantoprazole (PROTONIX) EC tablet 40 mg (has no administration in time range)  tiotropium (SPIRIVA) inhalation capsule 18 mcg (has no administration in time range)  traZODone (DESYREL) tablet 100 mg (has no administration in time range)     Initial Impression / Assessment and Plan / ED Course  Triage vital signs and the nursing notes have been reviewed.  Pertinent labs & imaging results that were available during care of the patient  were reviewed and considered in medical decision making (see chart for details).   Patient presents from his PCP for gangrene. Despite his initial vital signs, patient is well appearing on initial exam. He was tachycardic on presentation, but resolved on its own in the ED. Labs are consistent with the severity of the infection in patient's toes which can be seen with the pictures in his chart. Inpatient hospitalization is required for treatment of this infection and the probable amputation of these toes. Consult placed with inpatient medicine team who will admit this patient. Inpatient medicine asked to make the antibiotic decision for this case.  Final Clinical  Impressions(s) / ED Diagnoses  1. Gangrene of Bilateral Great Toes. Defer IV antibiotic option to inpatient medicine team. 2. Cellulitis of Bilateral Lower Extremities. Defer IV antibiotic option to inpatient medicine team.  Dispo: Admit. Consult placed with inpatient medicine who will admit patient.  Final diagnoses:  Cellulitis of left lower extremity  Cellulitis of right lower extremity  Gangrene [facility])    ED Discharge Orders    None        Romie Jumper, PA-C 09/13/17 1602    Destinae Neubecker, [place] I, PA-C 09/13/17 1618    Mesner, Corene Cornea, MD 09/14/17 1026

## 2017-09-13 NOTE — Plan of Care (Signed)
  Problem: Safety: Goal: Ability to remain free from injury will improve Outcome: Progressing   Problem: Tissue Perfusion: Goal: Adequacy of tissue perfusion will improve Outcome: Progressing   Problem: Metabolic: Goal: Ability to maintain appropriate glucose levels will improve Outcome: Progressing

## 2017-09-13 NOTE — ED Notes (Signed)
ED TO INPATIENT HANDOFF REPORT  Name/Age/Gender Mike Wright 84 y.o. male  Code Status    Code Status Orders  (From admission, onward)        Start     Ordered   09/13/17 1524  Full code  Continuous     09/13/17 1523    Code Status History    Date Active Date Inactive Code Status Order ID Comments User Context   05/17/2017 2038 05/20/2017 1640 Full Code [id number]  [name], [place] ED    Advance Directive Documentation     Most Recent Value  Type of Advance Directive  Healthcare Power of Attorney, Living will  Pre-existing out of facility DNR order (yellow form or pink MOST form)  -  "MOST" Form in Place?  -      Home/SNF/Other Home  Chief Complaint Gain green infection  Level of Care/Admitting Diagnosis ED Disposition    ED Disposition Condition [place]: [facility] [[id number]]  Level of Care: Med-Surg [16]  Diagnosis: Gangrene ([place]) [place].Felt.4.ICD-9-CM]  Admitting Physician: Axel Filler [[id number]]  Attending Physician: Axel Filler [[id number]]  Estimated length of stay: 3 - 4 days  Certification:: I certify this patient will need inpatient services for at least 2 midnights  PT Class (Do Not Modify): Inpatient [101]  PT Acc Code (Do Not Modify): Private [1]       Medical History Past Medical History:  Diagnosis Date  . Anemia    a. mild, noted 04/2017.  [name] CAD in native artery    a. [name] 04/2017 s/p DES to D1, DES to prox-mid LAD, DES to prox LAD overlapping the prior stent, LVEF 55-65%.   . Chronic diastolic CHF (congestive heart failure) ([place])   . Diabetic ulcer of toe ([place])   . DJD (degenerative joint disease) of cervical spine   . Essential hypertension, benign   . GERD (gastroesophageal reflux disease)   . Heart murmur   . History of hiatal hernia   . HIT (heparin-induced thrombocytopenia) ([place])   . Hypothyroidism   . Hypoxia    a. went home on home O2 04/2017.  [name] Insomnia   . Mixed  hyperlipidemia   . PAD (peripheral artery disease) ([place])   . PAF (paroxysmal atrial fibrillation) ([place])   . PSVT (paroxysmal supraventricular tachycardia) ([place])   . PVD (peripheral vascular disease) ([place])   . Renal insufficiency   . 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  . Sinus bradycardia    a. HR 30s-40s in 04/2017 -> diltiazem stopped, metoprolol reduced.  . Sleep apnea    "chose not to order CPAP at this time" (05/18/2017)  . Type 2 diabetes mellitus ([place])   . Wheezing    a. suspected COPD 04/2017. Former tobacco x 40 years.    Allergies Allergies  Allergen Reactions  . Codeine Shortness Of Breath  . Heparin Other (See Comments)    +HIT,  Severe bleeding   . Oxycodone Other (See Comments)    "Made me act out of my mind"    IV Location/Drains/Wounds Patient Lines/Drains/Airways Status   Active Line/Drains/Airways    Name:   Placement date:   Placement time:   Site:   Days:   Peripheral IV 09/13/17 Left Forearm   09/13/17    1310    Forearm   less than 1          Labs/Imaging Results for orders placed or performed during the  hospital encounter of 09/13/17 (from the past 48 hour(s))  Comprehensive metabolic panel     Status: Abnormal   Collection Time: 09/13/17  1:08 PM  Result Value Ref Range   Sodium 137 135 - 145 mmol/L   Potassium 3.8 3.5 - 5.1 mmol/L   Chloride 97 (L) 101 - 111 mmol/L   CO2 26 22 - 32 mmol/L   Glucose, Bld 197 (H) 65 - 99 mg/dL   BUN 12 6 - 20 mg/dL   Creatinine, Ser 1.03 0.61 - 1.24 mg/dL   Calcium 8.6 (L) 8.9 - 10.3 mg/dL   Total Protein 7.0 6.5 - 8.1 g/dL   Albumin 2.8 (L) 3.5 - 5.0 g/dL   AST 46 (H) 15 - 41 U/L   ALT 46 17 - 63 U/L   Alkaline Phosphatase 125 38 - 126 U/L   Total Bilirubin 0.9 0.3 - 1.2 mg/dL   GFR calc non Af Amer >60 >60 mL/min   GFR calc Af Amer >60 >60 mL/min    Comment: (NOTE) The eGFR has been calculated using the CKD EPI equation. This calculation has not been validated in all clinical situations. eGFR's  persistently <60 mL/min signify possible Chronic Kidney Disease.    Anion gap 14 5 - 15    Comment: Performed at [facility] [address] [place] [address]  CBC with Differential     Status: Abnormal   Collection Time: 09/13/17  1:08 PM  Result Value Ref Range   WBC 19.0 (H) 4.0 - 10.5 K/uL   RBC 3.92 (L) 4.22 - 5.81 MIL/uL   Hemoglobin 10.0 (L) 13.0 - 17.0 g/dL   HCT 32.1 (L) 39.0 - 52.0 %   MCV 81.9 78.0 - 100.0 fL   MCH 25.5 (L) 26.0 - 34.0 pg   MCHC 31.2 30.0 - 36.0 g/dL   RDW 16.8 (H) 11.5 - 15.5 %   Platelets 376 150 - 400 K/uL   Neutrophils Relative % 76 %   Neutro Abs 14.3 (H) 1.7 - 7.7 K/uL   Lymphocytes Relative 6 %   Lymphs Abs 1.1 0.7 - 4.0 K/uL   Monocytes Relative 15 %   Monocytes Absolute 2.9 (H) 0.1 - 1.0 K/uL   Eosinophils Relative 0 %   Eosinophils Absolute 0.0 0.0 - 0.7 K/uL   Basophils Relative 0 %   Basophils Absolute 0.1 0.0 - 0.1 K/uL   Immature Granulocytes 3 %   Abs Immature Granulocytes 0.6 (H) 0.0 - 0.1 K/uL    Comment: Performed at [facility] [address] [place], [place] [address]  Protime-INR     Status: Abnormal   Collection Time: 09/13/17  1:08 PM  Result Value Ref Range   Prothrombin Time 17.3 (H) 11.4 - 15.2 seconds   INR 1.43     Comment: Performed at [facility] [address] [place], [place] [address]  I-Stat CG4 Lactic Acid, ED     Status: Abnormal   Collection Time: 09/13/17  1:44 PM  Result Value Ref Range   Lactic Acid, Venous 2.42 (HH) 0.5 - 1.9 mmol/L   Comment NOTIFIED PHYSICIAN    No results found.  Pending Labs Unresulted Labs (From admission, onward)   Start     Ordered   09/14/17 0500  Comprehensive metabolic panel  Tomorrow morning,   R     09/13/17 1523   09/14/17 0500  CBC  Tomorrow morning,   R     09/13/17  1523   09/13/17 1206  Culture, blood (Routine x 2)  BLOOD CULTURE X 2,   STAT     09/13/17 1206   09/13/17 1206  Urinalysis, Routine w reflex microscopic  STAT,   STAT      09/13/17 1206      Vitals/Pain Today's Vitals   09/13/17 1400 09/13/17 1430 09/13/17 1500 09/13/17 1530  BP: (!) 144/81 106/61 122/76 134/64  Pulse: 98 94 90 92  Resp: 17 19 18 19   Temp:      TempSrc:      SpO2: 95% 93% 95% 95%  Weight:      Height:      PainSc:        Isolation Precautions No active isolations  Medications Medications  senna-docusate (Senokot-S) tablet 1 tablet (has no administration in time range)  acetaminophen (TYLENOL) tablet 650 mg (has no administration in time range)    Or  acetaminophen (TYLENOL) suppository 650 mg (has no administration in time range)  albuterol (PROVENTIL) (2.5 MG/3ML) 0.083% nebulizer solution 3 mL (has no administration in time range)  amLODipine (NORVASC) tablet 10 mg (has no administration in time range)  atorvastatin (LIPITOR) tablet 80 mg (has no administration in time range)  B-12 TABS 2,500 mcg (has no administration in time range)  levalbuterol (XOPENEX HFA) inhaler 1 puff (has no administration in time range)  insulin glargine (LANTUS) injection 30 Units (has no administration in time range)  lisinopril (PRINIVIL,ZESTRIL) tablet 5 mg (has no administration in time range)  metoprolol succinate (TOPROL-XL) 24 hr tablet 100 mg (has no administration in time range)  nitroGLYCERIN (NITROSTAT) SL tablet 0.4 mg (has no administration in time range)  pantoprazole (PROTONIX) EC tablet 40 mg (has no administration in time range)  tiotropium (SPIRIVA) inhalation capsule 18 mcg (has no administration in time range)  traZODone (DESYREL) tablet 100 mg (has no administration in time range)

## 2017-09-13 NOTE — ED Provider Notes (Signed)
Medical screening examination/treatment/procedure(s) were conducted as a shared visit with non-physician practitioner(s) and myself.  I personally evaluated the patient during the encounter.  She with diabetes and severe neuropathy the presents to the emergency department today with 2 black toes.  Patient states is been going on for a while thought it would get better.  He has neuropathy and cannot feel anything below his knees.  On exam his necrosis to both big toes on his right foot he has cellulitis that seems to go up approximately two thirds way up his foot is warm, edematous.  His left foot has cellulitis as well this not quite as bad may be halfway up his foot. Suspect probably has underlying osteomyelitis due to the odor and extent of the infection.  We will start antibiotics and admit to medicine for further management and surgical consult.    Merrily Pew, MD 09/14/17 1026

## 2017-09-13 NOTE — H&P (Signed)
Date: 09/13/2017               Patient Name:  Mike Wright MRN: [id number]  DOB: [birth year] Age / Sex: 84 y.o., male   PCP: Medicine, [facility] Internal         Medical Service: Internal Medicine Teaching Service         Attending Physician: Dr. Evette Doffing, Mallie Mussel, *    First Contact: Dr. Berline Lopes Pager: [phone]  Second Contact: Dr. Hetty Ely Pager: [phone]       After Hours (After 5p/  First Contact Pager: [phone]  weekends / holidays): Second Contact Pager: [phone]   Chief Complaint: "pain and swelling in toes"   History of Present Illness:  Mr. Kaigler is an 84 yo M with PMHx notable for CAD ( s/p stenting x2 to LAD and x1 to diag in February 2019), atrial fibrillation with paroxysmal supraventricular tachycardia, type 2 diabetes ( on insulin) who presents for evaluation of pain and blackening of his large toes on both feet. He noted swelling, pain, nonpurulent discharge from both toes in addition to the blackening. The patient stated that beginning April 4th of this year he began to note swelling and a blister in his right great toe. He had this drained by his PCP but it did not improve. He was placed on oral antibiotics with Epson salt soaks which did not improve the symptoms. The patient stated that approximately three weeks prior to this presentation he developed similar symptoms on his left great toe. He was scheduled to see vascular surgeries Dr. Donnetta Hutching on 09/19/2017 but due to the pain, nonpurulent discharge and swelling he could no longer wait.   ROS was positive for fever of >101F, chills, mild chest pain for which he follows with cardiology, diarrhea of two days duration, intermittent dyspnea w/o known trigger. He denied hematochezia, hematuria, urinary frequency, headache or new myalgias/join pain.   In the ED, a CMP noted a slightly elevated AST to 46, CBC w/ a leukocytosis of 19.0, Hgb 10.0, Plt 376, PT/INR 17.3/1.43. Blood cultures were taken, no imaging or EKGs were  performed. IMTS was called to admit but no consult to vascular surgery was placed by the ED.   Meds:  No current facility-administered medications on file prior to encounter.    Current Outpatient Medications on File Prior to Encounter  Medication Sig Dispense Refill  . albuterol (PROVENTIL) (2.5 MG/3ML) 0.083% nebulizer solution Inhale 3 mLs into the lungs every 4 (four) hours as needed for wheezing or shortness of breath. 75 mL 12  . amLODipine (NORVASC) 10 MG tablet Take 1 tablet (10 mg total) by mouth daily. 90 tablet 3  . apixaban (ELIQUIS) 5 MG TABS tablet Take 1 tablet (5 mg total) by mouth 2 (two) times daily. 180 tablet 3  . atorvastatin (LIPITOR) 80 MG tablet Take 1 tablet (80 mg total) by mouth every evening. 30 tablet 0  . Cholecalciferol (D3-1000 PO) Take 2,000 Units by mouth daily.    . clopidogrel (PLAVIX) 75 MG tablet Take 1 tablet (75 mg total) by mouth daily. 30 tablet 0  . Cyanocobalamin (B-12) 2500 MCG TABS Take 2,500 mcg by mouth daily.     . finasteride (PROSCAR) 5 MG tablet Take 5 mg by mouth daily.     . furosemide (LASIX) 20 MG tablet TAKE 1 TABLET DAILY AS NEEDED 90 tablet 0  . insulin glargine (LANTUS) 100 UNIT/ML injection Inject 30-40 Units into the skin See admin instructions. Inject 40  units SQ in the morning and inject 30 units SQ at bedtime    . levalbuterol (XOPENEX HFA) 45 MCG/ACT inhaler Inhale 1 puff into the lungs every 6 (six) hours as needed for wheezing or shortness of breath. 1 Inhaler 0  . lisinopril (PRINIVIL,ZESTRIL) 5 MG tablet Take 1 tablet (5 mg total) by mouth daily. 90 tablet 1  . Magnesium 250 MG TABS Take 250 mg by mouth daily.     . meclizine (ANTIVERT) 12.5 MG tablet Take 1 tablet (12.5 mg total) by mouth 3 (three) times daily as needed for dizziness. 30 tablet 0  . metFORMIN (GLUCOPHAGE) 1000 MG tablet Take 1,000 mg by mouth 2 (two) times daily with a meal.      . metoprolol succinate (TOPROL-XL) 100 MG 24 hr tablet TAKE 1/2 TABLET EVERY  DAY 45 tablet 1  . nitroGLYCERIN (NITROSTAT) 0.4 MG SL tablet PLACE 1 TABLET (0.4 MG TOTAL) UNDER THE TONGUE EVERY 5  MINUTES AS NEEDED FOR CHEST PAIN. 25 tablet 3  . pantoprazole (PROTONIX) 40 MG tablet Take 1 tablet (40 mg total) by mouth daily. To protect stomach while taking multiple blood thinners. 30 tablet 0  . tiotropium (SPIRIVA) 18 MCG inhalation capsule Place 1 capsule (18 mcg total) into inhaler and inhale daily. 30 capsule 12  . traZODone (DESYREL) 100 MG tablet Take 100 mg by mouth at bedtime.       Allergies: Allergies as of 09/13/2017 - Review Complete 09/13/2017  Allergen Reaction Noted  . Codeine Shortness Of Breath   . Heparin Other (See Comments) 10/20/2016  . Oxycodone Other (See Comments)    Past Medical History:  Diagnosis Date  . Anemia    a. mild, noted 04/2017.  [name] CAD in native artery    a. [name] 04/2017 s/p DES to D1, DES to prox-mid LAD, DES to prox LAD overlapping the prior stent, LVEF 55-65%.   . Chronic diastolic CHF (congestive heart failure) ([place])   . Diabetic ulcer of toe ([place])   . DJD (degenerative joint disease) of cervical spine   . Essential hypertension, benign   . GERD (gastroesophageal reflux disease)   . Heart murmur   . History of hiatal hernia   . HIT (heparin-induced thrombocytopenia) ([place])   . Hypothyroidism   . Hypoxia    a. went home on home O2 04/2017.  [name] Insomnia   . Mixed hyperlipidemia   . PAD (peripheral artery disease) ([place])   . PAF (paroxysmal atrial fibrillation) ([place])   . PSVT (paroxysmal supraventricular tachycardia) ([place])   . PVD (peripheral vascular disease) ([place])   . Renal insufficiency   . 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  . Sinus bradycardia    a. HR 30s-40s in 04/2017 -> diltiazem stopped, metoprolol reduced.  . Sleep apnea    "chose not to order CPAP at this time" (05/18/2017)  . Type 2 diabetes mellitus ([place])   . Wheezing    a. suspected COPD 04/2017. Former tobacco x 40 years.   Family History:    Mother-CHF Father-CHF deceased at age 48  Social History:  Social History   Tobacco Use  . Smoking status: Former Smoker    Packs/day: 2.00    Years: 30.00    Pack years: 60.00    Types: Cigarettes    Start date: 03/28/1953    Last attempt to quit: 03/29/1983    Years since quitting: 34.4  . Smokeless tobacco: Never Used  Substance Use Topics  . Alcohol  use: Yes    Comment: 05/18/2017 'I drink a beer q yr"  . Drug use: No   Review of Systems: A complete ROS was negative except as per HPI.   Physical Exam: Blood pressure 134/64, pulse 92, temperature 98.8 F (37.1 C), temperature source Oral, resp. rate 19, height 5\' 7"  (1.702 m), weight 208 lb (94.3 kg), SpO2 95 %. Physical Exam  Constitutional: He is oriented to person, place, and time. He appears well-developed and well-nourished. No distress.  HENT:  Head: Normocephalic and atraumatic.  Eyes: Conjunctivae and EOM are normal.  Neck: Normal range of motion.  Cardiovascular: Normal rate and regular rhythm.  Murmur heard. Pulses:      Dorsalis pedis pulses are 0 on the right side, and 0 on the left side.       Posterior tibial pulses are 0 on the right side, and 0 on the left side.  Diminished pulses but distal extremities warm and tender to touch.   Pulmonary/Chest: Effort normal and breath sounds normal. No stridor. No respiratory distress.  Abdominal: Soft. Bowel sounds are normal. He exhibits no distension. There is tenderness (Mid epigastric).  Musculoskeletal: He exhibits edema (+1 to the knees bilaterally ) and tenderness.  Neurological: He is alert and oriented to person, place, and time.  Skin: Skin is warm. He is not diaphoretic. There is erythema (Of the right foot expanding from the affected toe to the mid ankle).  Left foot is mildly cool to touch w/ capillary refill of <3 sec.   Psychiatric: He has a normal mood and affect.  Vitals reviewed.   EKG: personally reviewed my interpretation is not  performed  CXR: personally reviewed my interpretation is not performed   Assessment & Plan by Problem: Active Problems:   Diabetes ([place])   Essential hypertension   Paroxysmal supraventricular tachycardia (HCC)   CAD (coronary artery disease)   Chronic diastolic CHF (congestive heart failure) (HCC)   COPD (chronic obstructive pulmonary disease) (HCC)   Status post coronary artery stent placement   [facility])   Assessment: Mr. Stewart is an 84 yo M with PMHx notable for CAD ( s/p stenting x2 to LAD and x1 to diag in February 2019), atrial fibrillation with paroxysmal supraventricular tachycardia, type 2 diabetes ( on insulin) who presents for evaluation of pain and blackening of his large toes on both feet. The presentation is concerning for severe distal PAD with secondary dry gangrene with concern for secondary infection given that the skin barrier was broken multiple times in a non sterile environment. He will need admitted for evaluation and consideration of inpatient intervention.   Plan: Possible Ischemic limb: As above, there is notable ischemia of the distal first digits of each foot distal to the MTP joints bilaterally. This is most concerning for ischemia w/ secondary infection of the right foot. --Vascular saw the patient and are agreeable to completing an aortogram and considering intervention on Friday. --CTX 2g daily for probable cellulitis and concern for osteomyelitis. Notable leukocytosis, erythema, edema of the injury and Hx of insulin dependent diabetes.  --CBC daily --DG feet bilaterally to better assess for osteo  P-A-fib: On Eliquis at home. Holding this in anticipation of a procedure by vascular surgery. 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  --Holding Eliquis as above --Ordered EKG  Type 2 DM  No recent A1c on file. On lantus 40 mg qAM 30 mg qPM at home. --A1c in am --Continue Lantus at 30U  BID, will increase if his PO intake  improves --CBG with meals and QHS, No SSI unless his Lantus is inadequate  --BMP daily  CAD: S/P LHC in 04/2017 with drug eluting stents placed. Will need to remain on Plavix. Denied ongoing chest pain.  --Continued plavix 75mg  daily --PT-INR completed, 17.3/1.43 --Hgb 10.0, Plt 376  HTN: BP 134/64. We will continue his home Amlodipine 10mg  daily, and Lisinopril 5mg  daily.  Diet: Heart Healthy Code: Full Fluids: n/a GI PPX: Home Protonix 40mg  daily VTE PPX: SCDs (documented HIT to Heparin) Dispo: Admit patient to Inpatient with expected length of stay greater than 2 midnights.  Signed: Kathi Ludwig, MD 09/13/2017, 3:40 PM  Pager: Pager# [phone]

## 2017-09-13 NOTE — H&P (View-Only) (Signed)
Hospital Consult    Reason for Consult:  Ischemic feet Requesting Physician:  Ursula Beath MRN #:  [id number]  History of Present Illness: This is a 84 y.o. male who presented to the ED today with bilateral black great toes.  He states they have been black for a couple of weeks.  He originally developed a blister on the right great toe and this was drained by his PCP and placed on abx but this did not improve.  He subsequently developed a blister on the left great toe that also progressed.  He was scheduled to see Dr. Donnetta Hutching on 09/19/17 but due to the pain, he could no longer wait and presented to the ER.   He gets claudication sx after walking ~ 126ft for the past 6 months to a year.  Since his toes have turned black, he has had nausea and vomiting.  He reports fever and chills.  He does have shortness of breath/wheezing with COPD and is followed by Dr. Lake Bells.  He has a remote tobacco hx. He smoked for 50 years and quit in 1985.   He has a hx of chest pain and underwent cardiac catheterization in February 2019 with stent placement.  He remains on Plavix. He is on Eliquis for PAF.  He is on insulin for diabetes.  He is on nebs and inhalers for his COPD.  The pt is on a statin for cholesterol management.  He takes a beta blocker, ACEI and CCB for blood pressure management.    Past Medical History:  Diagnosis Date  . Anemia    a. mild, noted 04/2017.  [name] CAD in native artery    a. [name] 04/2017 s/p DES to D1, DES to prox-mid LAD, DES to prox LAD overlapping the prior stent, LVEF 55-65%.   . Chronic diastolic CHF (congestive heart failure) ([place])   . Diabetic ulcer of toe ([place])   . DJD (degenerative joint disease) of cervical spine   . Essential hypertension, benign   . GERD (gastroesophageal reflux disease)   . Heart murmur   . History of hiatal hernia   . HIT (heparin-induced thrombocytopenia) ([name])   . Hypothyroidism   . Hypoxia    a. went home on home O2 04/2017.  [name] Insomnia   . Mixed  hyperlipidemia   . PAD (peripheral artery disease) ([place])   . PAF (paroxysmal atrial fibrillation) ([place])   . PSVT (paroxysmal supraventricular tachycardia) ([place])   . PVD (peripheral vascular disease) ([place])   . Renal insufficiency   . 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  . Sinus bradycardia    a. HR 30s-40s in 04/2017 -> diltiazem stopped, metoprolol reduced.  . Sleep apnea    "chose not to order CPAP at this time" (05/18/2017)  . Type 2 diabetes mellitus ([place])   . Wheezing    a. suspected COPD 04/2017. Former tobacco x 40 years.    Past Surgical History:  Procedure Laterality Date  . APPENDECTOMY    . BACK SURGERY    . CARDIAC CATHETERIZATION  1980s; 2012;  . CATARACT EXTRACTION Left 2004  . COLONOSCOPY  2004   FLEISHMAN TICS  . CORONARY ANGIOPLASTY WITH STENT PLACEMENT  05/18/2017   "3 stents"  . CORONARY STENT INTERVENTION N/A 05/18/2017   Procedure: CORONARY STENT INTERVENTION;  Surgeon: Jettie Booze, MD;  Location: [place] [facility];  Service: Cardiovascular;  Laterality: N/A;  . JOINT REPLACEMENT    . LEFT HEART CATH AND CORONARY ANGIOGRAPHY N/A  05/18/2017   Procedure: LEFT HEART CATH AND CORONARY ANGIOGRAPHY;  Surgeon: Jettie Booze, MD;  Location: [place] [facility];  Service: Cardiovascular;  Laterality: N/A;  . LUMBAR FUSION  2002   L3, 4 L4, 5 L5 S1 Fused by Dr. Glenna Fellows  . POSTERIOR LUMBAR FUSION    . RHINOPLASTY    . RIGHT HEART CATH N/A 05/18/2017   Procedure: RIGHT HEART CATH;  Surgeon: Jettie Booze, MD;  Location: [place] [facility];  Service: Cardiovascular;  Laterality: N/A;  . TOTAL KNEE ARTHROPLASTY Bilateral   . TRANSURETHRAL RESECTION OF PROSTATE  2001   [place]    Allergies  Allergen Reactions  . Codeine Shortness Of Breath  . Heparin Other (See Comments)    +HIT,  Severe bleeding   . Losartan Swelling    Per ENT  . Oxycodone Other (See Comments)    "Made me act out of my mind"  . Other Other (See Comments)    Severe  bleeding     Prior to Admission medications   Medication Sig Start Date End Date Taking? Authorizing Provider  albuterol (PROVENTIL) (2.5 MG/3ML) 0.083% nebulizer solution Inhale 3 mLs into the lungs every 4 (four) hours as needed for wheezing or shortness of breath. 05/20/17   Daune Perch, NP  amLODipine (NORVASC) 10 MG tablet Take 1 tablet (10 mg total) by mouth daily. 06/05/17   Arnoldo Lenis, MD  apixaban (ELIQUIS) 5 MG TABS tablet Take 1 tablet (5 mg total) by mouth 2 (two) times daily. 11/23/15   Arnoldo Lenis, MD  atorvastatin (LIPITOR) 80 MG tablet Take 1 tablet (80 mg total) by mouth every evening. 05/19/17   Charlie Pitter, PA-C  Cholecalciferol (D3-1000 PO) Take 2,000 Units by mouth daily.    [provider]  clopidogrel (PLAVIX) 75 MG tablet Take 1 tablet (75 mg total) by mouth daily. 05/19/17   Dunn, Nedra Hai, PA-C  Cyanocobalamin (B-12) 2500 MCG TABS Take 2,500 mcg by mouth daily.     [provider]  finasteride (PROSCAR) 5 MG tablet Take 5 mg by mouth daily.  04/17/15   [provider]  furosemide (LASIX) 20 MG tablet TAKE 1 TABLET DAILY AS NEEDED 08/15/17   Arnoldo Lenis, MD  insulin glargine (LANTUS) 100 UNIT/ML injection Inject 30-40 Units into the skin See admin instructions. Inject 40 units SQ in the morning and inject 30 units SQ at bedtime    [provider]  levalbuterol (XOPENEX HFA) 45 MCG/ACT inhaler Inhale 1 puff into the lungs every 6 (six) hours as needed for wheezing or shortness of breath. 05/19/17 05/19/18  Dunn, Lisbeth Renshaw N, PA-C  lisinopril (PRINIVIL,ZESTRIL) 5 MG tablet Take 1 tablet (5 mg total) by mouth daily. 06/27/17 09/25/17  Arnoldo Lenis, MD  Magnesium 250 MG TABS Take 250 mg by mouth daily.     [provider]  meclizine (ANTIVERT) 12.5 MG tablet Take 1 tablet (12.5 mg total) by mouth 3 (three) times daily as needed for dizziness. 05/30/17   Richardson Dopp T, PA-C  metFORMIN (GLUCOPHAGE) 1000 MG tablet Take  1,000 mg by mouth 2 (two) times daily with a meal.      [provider]  metoprolol succinate (TOPROL-XL) 100 MG 24 hr tablet TAKE 1/2 TABLET EVERY DAY 05/24/17   Arnoldo Lenis, MD  nitroGLYCERIN (NITROSTAT) 0.4 MG SL tablet PLACE 1 TABLET (0.4 MG TOTAL) UNDER THE TONGUE EVERY 5  MINUTES AS NEEDED FOR CHEST PAIN. 05/22/17  Arnoldo Lenis, MD  pantoprazole (PROTONIX) 40 MG tablet Take 1 tablet (40 mg total) by mouth daily. To protect stomach while taking multiple blood thinners. 05/19/17   Dunn, Nedra Hai, PA-C  tiotropium (SPIRIVA) 18 MCG inhalation capsule Place 1 capsule (18 mcg total) into inhaler and inhale daily. 05/20/17   Daune Perch, NP  traZODone (DESYREL) 100 MG tablet Take 100 mg by mouth at bedtime.  03/25/16   [provider]    Social History   Socioeconomic History  . Marital status: Married    Spouse name: Not on file  . Number of children: Not on file  . Years of education: Not on file  . Highest education level: Not on file  Occupational History  . Not on file  Social Needs  . Financial resource strain: Not on file  . Food insecurity:    Worry: Not on file    Inability: Not on file  . Transportation needs:    Medical: Not on file    Non-medical: Not on file  Tobacco Use  . Smoking status: Former Smoker    Packs/day: 2.00    Years: 30.00    Pack years: 60.00    Types: Cigarettes    Start date: 03/28/1953    Last attempt to quit: 03/29/1983    Years since quitting: 34.4  . Smokeless tobacco: Never Used  Substance and Sexual Activity  . Alcohol use: Yes    Comment: 05/18/2017 'I drink a beer q yr"  . Drug use: No  . Sexual activity: Not on file  Lifestyle  . Physical activity:    Days per week: Not on file    Minutes per session: Not on file  . Stress: Not on file  Relationships  . Social connections:    Talks on phone: Not on file    Gets together: Not on file    Attends religious service: Not on file    Active member of club or  organization: Not on file    Attends meetings of clubs or organizations: Not on file    Relationship status: Not on file  . Intimate partner violence:    Fear of current or ex partner: Not on file    Emotionally abused: Not on file    Physically abused: Not on file    Forced sexual activity: Not on file  Other Topics Concern  . Not on file  Social History Narrative  . Not on file     Family History  Problem Relation Age of Onset  . Heart attack Father 29  . COPD Father   . COPD Mother   . Heart disease Mother   . Diabetes Mother   . Hypertension Sister   . CVA Sister   . Diabetes Sister   . Multiple sclerosis Sister     ROS: [x]  Positive   [ ]  Negative   [ ]  All sytems reviewed and are negative  Cardiac: [x]  hx chest pain/pressure in February []  palpitations [x]  SOB lying flat [x]  DOE  Vascular: [x]  pain in legs while walking []  pain in legs at rest []  pain in legs at night [x]  non-healing ulcers  Pulmonary: []  productive cough [x]  asthma/wheezing []  home O2  Neurologic: []  weakness in []  arms []  legs []  numbness in []  arms []  legs []  hx of CVA []  mini stroke [] difficulty speaking or slurred speech []  temporary loss of vision in one eye []  dizziness  Hematologic: []  hx of cancer []  bleeding  problems []  problems with blood clotting easily [x]  ? HIT   Endocrine:   [x]  diabetes [x]  thyroid disease  GI []  vomiting blood []  blood in stool [x]  GERD  GU: []  CKD/renal failure []  HD--[]  M/W/F or []  T/T/S []  burning with urination []  blood in urine  Psychiatric: []  anxiety []  depression  Musculoskeletal: []  arthritis []  joint pain [x]  DJD  Integumentary: []  rashes []  ulcers  Constitutional: [x]  fever [x]  chills   Physical Examination  Vitals:   09/13/17 1500 09/13/17 1530  BP: 122/76 134/64  Pulse: 90 92  Resp: 18 19  Temp:    SpO2: 95% 95%   Body mass index is 32.58 kg/m.  General:  WDWN in NAD Gait: Not observed HENT:  WNL, normocephalic Pulmonary: normal non-labored breathing Cardiac: regular Abdomen:  soft, NT/ND, no masses Skin: without rashes Vascular Exam/Pulses:  Right Left  Radial 2+ (normal) 2+ (normal)  Femoral 2+ (normal) 2+ (normal)  Popliteal 3+ (hyperdynamic) 3+ (hyperdynamic)  DP monophasic monophasic  PT monophasic monophasic  Peroneal faint faint   Extremities: with gangrenous great toes bilaterally with cellulitis extending onto the dorsum of the foot bilaterally that are malodorous Musculoskeletal: no muscle wasting or atrophy  Neurologic: A&O X 3;  No focal weakness or paresthesias are detected; speech is fluent/normal Psychiatric:  The pt has Normal affect.   CBC    Component Value Date/Time   WBC 19.0 (H) 09/13/2017 1308   RBC 3.92 (L) 09/13/2017 1308   HGB 10.0 (L) 09/13/2017 1308   HGB 12.3 (L) 05/30/2017 1531   HCT 32.1 (L) 09/13/2017 1308   HCT 37.7 05/30/2017 1531   PLT 376 09/13/2017 1308   PLT 212 05/30/2017 1531   MCV 81.9 09/13/2017 1308   MCV 86 05/30/2017 1531   MCH 25.5 (L) 09/13/2017 1308   MCHC 31.2 09/13/2017 1308   RDW 16.8 (H) 09/13/2017 1308   RDW 16.0 (H) 05/30/2017 1531   LYMPHSABS 1.1 09/13/2017 1308   MONOABS 2.9 (H) 09/13/2017 1308   EOSABS 0.0 09/13/2017 1308   BASOSABS 0.1 09/13/2017 1308    BMET    Component Value Date/Time   NA 137 09/13/2017 1308   NA 139 05/30/2017 1531   K 3.8 09/13/2017 1308   CL 97 (L) 09/13/2017 1308   CO2 26 09/13/2017 1308   GLUCOSE 197 (H) 09/13/2017 1308   BUN 12 09/13/2017 1308   BUN 13 05/30/2017 1531   CREATININE 1.03 09/13/2017 1308   CALCIUM 8.6 (L) 09/13/2017 1308   GFRNONAA >60 09/13/2017 1308   GFRAA >60 09/13/2017 1308    COAGS: Lab Results  Component Value Date   INR 1.43 09/13/2017   INR 0.97 03/30/2010   INR 0.95 03/27/2010     Non-Invasive Vascular Imaging:   none  Statin:  Yes.   Beta Blocker:  Yes.   Aspirin:  No. ACEI:  Yes.   ARB:  No. CCB use:  Yes Other  antiplatelets/anticoagulants:  Yes.   eliquis and plavix   ASSESSMENT/PLAN: This is a 84 y.o. male with hx of diabetes, CAD and COPD admitted with gangrenous bilateral great toes and cellulitis on the dorsum of both feet.    -pt with hx of claudication sx over the past 6 months to a year who developed blisters on both feet over the past couple of months now with gangrene of bilateral great toes that is malodorous.  Dr. Oneida Alar discussed with pt that he will eventually need at least his great toes amputated and  is possibly at risk for limb loss.  He has hx of diabetes and neuropathy and hx of cardiac catheterization earlier this year with cardiac stent placement and is on Plavix. -pt has heparin listed as an allergy - will send off HIT panel -will plan for aortogram with bilateral lower extremity runoff and possible intervention for Friday.  Eliquis will need to be held. -he has prominent popliteal pulses bilaterally-will order duplex to evaluate. -IV abx per primary team   Leontine Locket, PA-C [facility] [phone]  Agree with above.  Pt has gangrenous 1st toe bilaterally no pedal pulses but easily palpable popliteal pulses most likely tibial disease in this diabetic former smoker.  Will also get popliteal [place] to rule out aneurysm.   Plan for agram possible intervention Friday.  Procedure risks benefits discussed with pt and wife.  Need to repeat HIT profile   Hold Eliquis transition to heparin  Ruta Hinds, MD [facility] [place] Office: [phone] Pager: [phone]

## 2017-09-13 NOTE — Consult Note (Addendum)
Hospital Consult    Reason for Consult:  Ischemic feet Requesting Physician:  Ursula Beath MRN #:  [id number]  History of Present Illness: This is a 84 y.o. male who presented to the ED today with bilateral black great toes.  He states they have been black for a couple of weeks.  He originally developed a blister on the right great toe and this was drained by his PCP and placed on abx but this did not improve.  He subsequently developed a blister on the left great toe that also progressed.  He was scheduled to see Dr. Donnetta Hutching on 09/19/17 but due to the pain, he could no longer wait and presented to the ER.   He gets claudication sx after walking ~ 153ft for the past 6 months to a year.  Since his toes have turned black, he has had nausea and vomiting.  He reports fever and chills.  He does have shortness of breath/wheezing with COPD and is followed by Dr. Lake Bells.  He has a remote tobacco hx. He smoked for 50 years and quit in 1985.   He has a hx of chest pain and underwent cardiac catheterization in February 2019 with stent placement.  He remains on Plavix. He is on Eliquis for PAF.  He is on insulin for diabetes.  He is on nebs and inhalers for his COPD.  The pt is on a statin for cholesterol management.  He takes a beta blocker, ACEI and CCB for blood pressure management.    Past Medical History:  Diagnosis Date  . Anemia    a. mild, noted 04/2017.  [name] CAD in native artery    a. [name] 04/2017 s/p DES to D1, DES to prox-mid LAD, DES to prox LAD overlapping the prior stent, LVEF 55-65%.   . Chronic diastolic CHF (congestive heart failure) ([place])   . Diabetic ulcer of toe ([place])   . DJD (degenerative joint disease) of cervical spine   . Essential hypertension, benign   . GERD (gastroesophageal reflux disease)   . Heart murmur   . History of hiatal hernia   . HIT (heparin-induced thrombocytopenia) ([place])   . Hypothyroidism   . Hypoxia    a. went home on home O2 04/2017.  [name] Insomnia   . Mixed  hyperlipidemia   . PAD (peripheral artery disease) ([place])   . PAF (paroxysmal atrial fibrillation) ([place])   . PSVT (paroxysmal supraventricular tachycardia) ([place])   . PVD (peripheral vascular disease) ([place])   . Renal insufficiency   . 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  . Sinus bradycardia    a. HR 30s-40s in 04/2017 -> diltiazem stopped, metoprolol reduced.  . Sleep apnea    "chose not to order CPAP at this time" (05/18/2017)  . Type 2 diabetes mellitus ([place])   . Wheezing    a. suspected COPD 04/2017. Former tobacco x 40 years.    Past Surgical History:  Procedure Laterality Date  . APPENDECTOMY    . BACK SURGERY    . CARDIAC CATHETERIZATION  1980s; 2012;  . CATARACT EXTRACTION Left 2004  . COLONOSCOPY  2004   FLEISHMAN TICS  . CORONARY ANGIOPLASTY WITH STENT PLACEMENT  05/18/2017   "3 stents"  . CORONARY STENT INTERVENTION N/A 05/18/2017   Procedure: CORONARY STENT INTERVENTION;  Surgeon: Jettie Booze, MD;  Location: [facility];  Service: Cardiovascular;  Laterality: N/A;  . JOINT REPLACEMENT    . LEFT HEART CATH AND CORONARY ANGIOGRAPHY N/A  05/18/2017   Procedure: LEFT HEART CATH AND CORONARY ANGIOGRAPHY;  Surgeon: Jettie Booze, MD;  Location: [facility];  Service: Cardiovascular;  Laterality: N/A;  . LUMBAR FUSION  2002   L3, 4 L4, 5 L5 S1 Fused by Dr. Glenna Fellows  . POSTERIOR LUMBAR FUSION    . RHINOPLASTY    . RIGHT HEART CATH N/A 05/18/2017   Procedure: RIGHT HEART CATH;  Surgeon: Jettie Booze, MD;  Location: [facility];  Service: Cardiovascular;  Laterality: N/A;  . TOTAL KNEE ARTHROPLASTY Bilateral   . TRANSURETHRAL RESECTION OF PROSTATE  2001   [facility]    Allergies  Allergen Reactions  . Codeine Shortness Of Breath  . Heparin Other (See Comments)    +HIT,  Severe bleeding   . Losartan Swelling    Per ENT  . Oxycodone Other (See Comments)    "Made me act out of my mind"  . Other Other (See Comments)    Severe  bleeding     Prior to Admission medications   Medication Sig Start Date End Date Taking? Authorizing Provider  albuterol (PROVENTIL) (2.5 MG/3ML) 0.083% nebulizer solution Inhale 3 mLs into the lungs every 4 (four) hours as needed for wheezing or shortness of breath. 05/20/17   Daune Perch, NP  amLODipine (NORVASC) 10 MG tablet Take 1 tablet (10 mg total) by mouth daily. 06/05/17   Arnoldo Lenis, MD  apixaban (ELIQUIS) 5 MG TABS tablet Take 1 tablet (5 mg total) by mouth 2 (two) times daily. 11/23/15   Arnoldo Lenis, MD  atorvastatin (LIPITOR) 80 MG tablet Take 1 tablet (80 mg total) by mouth every evening. 05/19/17   Charlie Pitter, PA-C  Cholecalciferol (D3-1000 PO) Take 2,000 Units by mouth daily.    [provider]  clopidogrel (PLAVIX) 75 MG tablet Take 1 tablet (75 mg total) by mouth daily. 05/19/17   Dunn, Nedra Hai, PA-C  Cyanocobalamin (B-12) 2500 MCG TABS Take 2,500 mcg by mouth daily.     [provider]  finasteride (PROSCAR) 5 MG tablet Take 5 mg by mouth daily.  04/17/15   [provider]  furosemide (LASIX) 20 MG tablet TAKE 1 TABLET DAILY AS NEEDED 08/15/17   Arnoldo Lenis, MD  insulin glargine (LANTUS) 100 UNIT/ML injection Inject 30-40 Units into the skin See admin instructions. Inject 40 units SQ in the morning and inject 30 units SQ at bedtime    [provider]  levalbuterol (XOPENEX HFA) 45 MCG/ACT inhaler Inhale 1 puff into the lungs every 6 (six) hours as needed for wheezing or shortness of breath. 05/19/17 05/19/18  Dunn, Lisbeth Renshaw N, PA-C  lisinopril (PRINIVIL,ZESTRIL) 5 MG tablet Take 1 tablet (5 mg total) by mouth daily. 06/27/17 09/25/17  Arnoldo Lenis, MD  Magnesium 250 MG TABS Take 250 mg by mouth daily.     [provider]  meclizine (ANTIVERT) 12.5 MG tablet Take 1 tablet (12.5 mg total) by mouth 3 (three) times daily as needed for dizziness. 05/30/17   Richardson Dopp T, PA-C  metFORMIN (GLUCOPHAGE) 1000 MG tablet Take  1,000 mg by mouth 2 (two) times daily with a meal.      [provider]  metoprolol succinate (TOPROL-XL) 100 MG 24 hr tablet TAKE 1/2 TABLET EVERY DAY 05/24/17   Arnoldo Lenis, MD  nitroGLYCERIN (NITROSTAT) 0.4 MG SL tablet PLACE 1 TABLET (0.4 MG TOTAL) UNDER THE TONGUE EVERY 5  MINUTES AS NEEDED FOR CHEST PAIN. 05/22/17  Arnoldo Lenis, MD  pantoprazole (PROTONIX) 40 MG tablet Take 1 tablet (40 mg total) by mouth daily. To protect stomach while taking multiple blood thinners. 05/19/17   Dunn, Nedra Hai, PA-C  tiotropium (SPIRIVA) 18 MCG inhalation capsule Place 1 capsule (18 mcg total) into inhaler and inhale daily. 05/20/17   Daune Perch, NP  traZODone (DESYREL) 100 MG tablet Take 100 mg by mouth at bedtime.  03/25/16   [provider]    Social History   Socioeconomic History  . Marital status: Married    Spouse name: Not on file  . Number of children: Not on file  . Years of education: Not on file  . Highest education level: Not on file  Occupational History  . Not on file  Social Needs  . Financial resource strain: Not on file  . Food insecurity:    Worry: Not on file    Inability: Not on file  . Transportation needs:    Medical: Not on file    Non-medical: Not on file  Tobacco Use  . Smoking status: Former Smoker    Packs/day: 2.00    Years: 30.00    Pack years: 60.00    Types: Cigarettes    Start date: 03/28/1953    Last attempt to quit: 03/29/1983    Years since quitting: 34.4  . Smokeless tobacco: Never Used  Substance and Sexual Activity  . Alcohol use: Yes    Comment: 05/18/2017 'I drink a beer q yr"  . Drug use: No  . Sexual activity: Not on file  Lifestyle  . Physical activity:    Days per week: Not on file    Minutes per session: Not on file  . Stress: Not on file  Relationships  . Social connections:    Talks on phone: Not on file    Gets together: Not on file    Attends religious service: Not on file    Active member of club or  organization: Not on file    Attends meetings of clubs or organizations: Not on file    Relationship status: Not on file  . Intimate partner violence:    Fear of current or ex partner: Not on file    Emotionally abused: Not on file    Physically abused: Not on file    Forced sexual activity: Not on file  Other Topics Concern  . Not on file  Social History Narrative  . Not on file     Family History  Problem Relation Age of Onset  . Heart attack Father 69  . COPD Father   . COPD Mother   . Heart disease Mother   . Diabetes Mother   . Hypertension Sister   . CVA Sister   . Diabetes Sister   . Multiple sclerosis Sister     ROS: [x]  Positive   [ ]  Negative   [ ]  All sytems reviewed and are negative  Cardiac: [x]  hx chest pain/pressure in February []  palpitations [x]  SOB lying flat [x]  DOE  Vascular: [x]  pain in legs while walking []  pain in legs at rest []  pain in legs at night [x]  non-healing ulcers  Pulmonary: []  productive cough [x]  asthma/wheezing []  home O2  Neurologic: []  weakness in []  arms []  legs []  numbness in []  arms []  legs []  hx of CVA []  mini stroke [] difficulty speaking or slurred speech []  temporary loss of vision in one eye []  dizziness  Hematologic: []  hx of cancer []  bleeding  problems []  problems with blood clotting easily [x]  ? HIT   Endocrine:   [x]  diabetes [x]  thyroid disease  GI []  vomiting blood []  blood in stool [x]  GERD  GU: []  CKD/renal failure []  HD--[]  M/W/F or []  T/T/S []  burning with urination []  blood in urine  Psychiatric: []  anxiety []  depression  Musculoskeletal: []  arthritis []  joint pain [x]  DJD  Integumentary: []  rashes []  ulcers  Constitutional: [x]  fever [x]  chills   Physical Examination  Vitals:   09/13/17 1500 09/13/17 1530  BP: 122/76 134/64  Pulse: 90 92  Resp: 18 19  Temp:    SpO2: 95% 95%   Body mass index is 32.58 kg/m.  General:  WDWN in NAD Gait: Not observed HENT:  WNL, normocephalic Pulmonary: normal non-labored breathing Cardiac: regular Abdomen:  soft, NT/ND, no masses Skin: without rashes Vascular Exam/Pulses:  Right Left  Radial 2+ (normal) 2+ (normal)  Femoral 2+ (normal) 2+ (normal)  Popliteal 3+ (hyperdynamic) 3+ (hyperdynamic)  DP monophasic monophasic  PT monophasic monophasic  Peroneal faint faint   Extremities: with gangrenous great toes bilaterally with cellulitis extending onto the dorsum of the foot bilaterally that are malodorous Musculoskeletal: no muscle wasting or atrophy  Neurologic: A&O X 3;  No focal weakness or paresthesias are detected; speech is fluent/normal Psychiatric:  The pt has Normal affect.   CBC    Component Value Date/Time   WBC 19.0 (H) 09/13/2017 1308   RBC 3.92 (L) 09/13/2017 1308   HGB 10.0 (L) 09/13/2017 1308   HGB 12.3 (L) 05/30/2017 1531   HCT 32.1 (L) 09/13/2017 1308   HCT 37.7 05/30/2017 1531   PLT 376 09/13/2017 1308   PLT 212 05/30/2017 1531   MCV 81.9 09/13/2017 1308   MCV 86 05/30/2017 1531   MCH 25.5 (L) 09/13/2017 1308   MCHC 31.2 09/13/2017 1308   RDW 16.8 (H) 09/13/2017 1308   RDW 16.0 (H) 05/30/2017 1531   LYMPHSABS 1.1 09/13/2017 1308   MONOABS 2.9 (H) 09/13/2017 1308   EOSABS 0.0 09/13/2017 1308   BASOSABS 0.1 09/13/2017 1308    BMET    Component Value Date/Time   NA 137 09/13/2017 1308   NA 139 05/30/2017 1531   K 3.8 09/13/2017 1308   CL 97 (L) 09/13/2017 1308   CO2 26 09/13/2017 1308   GLUCOSE 197 (H) 09/13/2017 1308   BUN 12 09/13/2017 1308   BUN 13 05/30/2017 1531   CREATININE 1.03 09/13/2017 1308   CALCIUM 8.6 (L) 09/13/2017 1308   GFRNONAA >60 09/13/2017 1308   GFRAA >60 09/13/2017 1308    COAGS: Lab Results  Component Value Date   INR 1.43 09/13/2017   INR 0.97 03/30/2010   INR 0.95 03/27/2010     Non-Invasive Vascular Imaging:   none  Statin:  Yes.   Beta Blocker:  Yes.   Aspirin:  No. ACEI:  Yes.   ARB:  No. CCB use:  Yes Other  antiplatelets/anticoagulants:  Yes.   eliquis and plavix   ASSESSMENT/PLAN: This is a 84 y.o. male with hx of diabetes, CAD and COPD admitted with gangrenous bilateral great toes and cellulitis on the dorsum of both feet.    -pt with hx of claudication sx over the past 6 months to a year who developed blisters on both feet over the past couple of months now with gangrene of bilateral great toes that is malodorous.  Dr. Oneida Alar discussed with pt that he will eventually need at least his great toes amputated and  is possibly at risk for limb loss.  He has hx of diabetes and neuropathy and hx of cardiac catheterization earlier this year with cardiac stent placement and is on Plavix. -pt has heparin listed as an allergy - will send off HIT panel -will plan for aortogram with bilateral lower extremity runoff and possible intervention for Friday.  Eliquis will need to be held. -he has prominent popliteal pulses bilaterally-will order duplex to evaluate. -IV abx per primary team   Leontine Locket, PA-C [facility] [phone]  Agree with above.  Pt has gangrenous 1st toe bilaterally no pedal pulses but easily palpable popliteal pulses most likely tibial disease in this diabetic former smoker.  Will also get popliteal [place] to rule out aneurysm.   Plan for agram possible intervention Friday.  Procedure risks benefits discussed with pt and wife.  Need to repeat HIT profile   Hold Eliquis transition to heparin  Ruta Hinds, MD [facility] Office: [phone] Pager: [phone]

## 2017-09-14 ENCOUNTER — Inpatient Hospital Stay (HOSPITAL_COMMUNITY): Payer: Medicare HMO

## 2017-09-14 DIAGNOSIS — E1152 Type 2 diabetes mellitus with diabetic peripheral angiopathy with gangrene: Principal | ICD-10-CM

## 2017-09-14 DIAGNOSIS — Z955 Presence of coronary angioplasty implant and graft: Secondary | ICD-10-CM

## 2017-09-14 DIAGNOSIS — I471 Supraventricular tachycardia: Secondary | ICD-10-CM

## 2017-09-14 DIAGNOSIS — I739 Peripheral vascular disease, unspecified: Secondary | ICD-10-CM

## 2017-09-14 DIAGNOSIS — R7881 Bacteremia: Secondary | ICD-10-CM

## 2017-09-14 DIAGNOSIS — B9562 Methicillin resistant Staphylococcus aureus infection as the cause of diseases classified elsewhere: Secondary | ICD-10-CM

## 2017-09-14 DIAGNOSIS — Z79899 Other long term (current) drug therapy: Secondary | ICD-10-CM

## 2017-09-14 DIAGNOSIS — I10 Essential (primary) hypertension: Secondary | ICD-10-CM

## 2017-09-14 DIAGNOSIS — I480 Paroxysmal atrial fibrillation: Secondary | ICD-10-CM

## 2017-09-14 DIAGNOSIS — Z7902 Long term (current) use of antithrombotics/antiplatelets: Secondary | ICD-10-CM

## 2017-09-14 DIAGNOSIS — I96 Gangrene, not elsewhere classified: Secondary | ICD-10-CM

## 2017-09-14 DIAGNOSIS — I2510 Atherosclerotic heart disease of native coronary artery without angina pectoris: Secondary | ICD-10-CM

## 2017-09-14 DIAGNOSIS — Z7901 Long term (current) use of anticoagulants: Secondary | ICD-10-CM

## 2017-09-14 DIAGNOSIS — Z794 Long term (current) use of insulin: Secondary | ICD-10-CM

## 2017-09-14 LAB — HEMOGLOBIN A1C
HEMOGLOBIN A1C: 7.1 % — AB (ref 4.8–5.6)
MEAN PLASMA GLUCOSE: 157.07 mg/dL

## 2017-09-14 LAB — COMPREHENSIVE METABOLIC PANEL
ALK PHOS: 112 U/L (ref 38–126)
ALT: 42 U/L (ref 17–63)
AST: 50 U/L — ABNORMAL HIGH (ref 15–41)
Albumin: 2.3 g/dL — ABNORMAL LOW (ref 3.5–5.0)
Anion gap: 9 (ref 5–15)
BILIRUBIN TOTAL: 0.9 mg/dL (ref 0.3–1.2)
BUN: 11 mg/dL (ref 6–20)
CALCIUM: 8.4 mg/dL — AB (ref 8.9–10.3)
CHLORIDE: 101 mmol/L (ref 101–111)
CO2: 29 mmol/L (ref 22–32)
CREATININE: 0.92 mg/dL (ref 0.61–1.24)
Glucose, Bld: 120 mg/dL — ABNORMAL HIGH (ref 65–99)
Potassium: 4.3 mmol/L (ref 3.5–5.1)
Sodium: 139 mmol/L (ref 135–145)
TOTAL PROTEIN: 6.2 g/dL — AB (ref 6.5–8.1)

## 2017-09-14 LAB — BLOOD CULTURE ID PANEL (REFLEXED)
Acinetobacter baumannii: NOT DETECTED
CANDIDA KRUSEI: NOT DETECTED
CANDIDA PARAPSILOSIS: NOT DETECTED
CANDIDA TROPICALIS: NOT DETECTED
Candida albicans: NOT DETECTED
Candida glabrata: NOT DETECTED
ENTEROCOCCUS SPECIES: NOT DETECTED
ESCHERICHIA COLI: NOT DETECTED
Enterobacter cloacae complex: NOT DETECTED
Enterobacteriaceae species: NOT DETECTED — AB
Haemophilus influenzae: NOT DETECTED
KLEBSIELLA OXYTOCA: NOT DETECTED
KLEBSIELLA PNEUMONIAE: NOT DETECTED
Listeria monocytogenes: NOT DETECTED
Methicillin resistance: DETECTED — AB
Neisseria meningitidis: NOT DETECTED
PROTEUS SPECIES: NOT DETECTED
Pseudomonas aeruginosa: NOT DETECTED
SERRATIA MARCESCENS: NOT DETECTED
STAPHYLOCOCCUS SPECIES: DETECTED — AB
Staphylococcus aureus (BCID): DETECTED — AB
Streptococcus agalactiae: NOT DETECTED
Streptococcus pneumoniae: NOT DETECTED
Streptococcus pyogenes: NOT DETECTED
Streptococcus species: NOT DETECTED — AB

## 2017-09-14 LAB — CBC
HCT: 28.7 % — ABNORMAL LOW (ref 39.0–52.0)
Hemoglobin: 9.1 g/dL — ABNORMAL LOW (ref 13.0–17.0)
MCH: 25.6 pg — ABNORMAL LOW (ref 26.0–34.0)
MCHC: 31.7 g/dL (ref 30.0–36.0)
MCV: 80.8 fL (ref 78.0–100.0)
PLATELETS: 310 10*3/uL (ref 150–400)
RBC: 3.55 MIL/uL — AB (ref 4.22–5.81)
RDW: 16.6 % — ABNORMAL HIGH (ref 11.5–15.5)
WBC: 15.4 10*3/uL — AB (ref 4.0–10.5)

## 2017-09-14 LAB — HEPARIN INDUCED PLATELET AB (HIT ANTIBODY): HEPARIN INDUCED PLT AB: 0.182 {OD_unit} (ref 0.000–0.400)

## 2017-09-14 LAB — SEDIMENTATION RATE: Sed Rate: 96 mm/hr — ABNORMAL HIGH (ref 0–16)

## 2017-09-14 LAB — GLUCOSE, CAPILLARY
GLUCOSE-CAPILLARY: 157 mg/dL — AB (ref 65–99)
GLUCOSE-CAPILLARY: 243 mg/dL — AB (ref 65–99)
Glucose-Capillary: 120 mg/dL — ABNORMAL HIGH (ref 65–99)
Glucose-Capillary: 143 mg/dL — ABNORMAL HIGH (ref 65–99)

## 2017-09-14 MED ORDER — VANCOMYCIN HCL 10 G IV SOLR
2000.0000 mg | Freq: Once | INTRAVENOUS | Status: DC
  Filled 2017-09-14: qty 2000

## 2017-09-14 MED ORDER — SODIUM CHLORIDE 0.9 % IV SOLN
8.0000 mg/kg | INTRAVENOUS | Status: DC
  Administered 2017-09-14: 774.5 mg via INTRAVENOUS
  Filled 2017-09-14 (×2): qty 15.49

## 2017-09-14 MED ORDER — INSULIN GLARGINE 100 UNIT/ML ~~LOC~~ SOLN
15.0000 [IU] | Freq: Two times a day (BID) | SUBCUTANEOUS | Status: DC
  Administered 2017-09-14 – 2017-09-16 (×4): 15 [IU] via SUBCUTANEOUS
  Filled 2017-09-14 (×4): qty 0.15

## 2017-09-14 MED ORDER — SODIUM CHLORIDE 0.9 % IV SOLN
INTRAVENOUS | Status: DC | PRN
  Administered 2017-09-14: 16:00:00 via INTRAVENOUS

## 2017-09-14 MED ORDER — VANCOMYCIN HCL 10 G IV SOLR: 1250.0000 mg | Freq: Two times a day (BID) | INTRAVENOUS | Status: DC

## 2017-09-14 NOTE — Consult Note (Signed)
Mike Wright for Infectious Disease  Total days of antibiotics 1               Reason for Consult: MRSA bacteremia   Referring Physician: Damita Dunnings  Active Problems:   Diabetes [facility])   Essential hypertension   Paroxysmal supraventricular tachycardia (HCC)   CAD (coronary artery disease)   Chronic diastolic CHF (congestive heart failure) (HCC)   COPD (chronic obstructive pulmonary disease) ([place])   Status post coronary artery stent placement   Gangrene (HCC)    HPI: Mike Wright is a 84 y.o. male  with a history of CAD s/p PCI in Feb 2019 for unstable angina, PAF on eliquis, PSVT/palpitations, chronic diastolic CHF, HTN, aortic stenosis, renal insufficiency and DM c/b diabetic neuropathy who reports having blister/cellulitis to bilateral great toe that started roughly in April. He has had 2 course of oral abtx for which has not improved but process of his toes has steadily become gangrenous. He has been off of abtx for roughly 2 wks and in the last week he has noticed foul order. He presented on 6/19 as referral from his pcp due to his dry gangrene toes. He states that in the beginning, there was noted drainage but not int he last week. He also endorses fever of 101F on the day of admit.  he was found to have leukocytosis of 19K, but afebrile, exam significant for gangrenous toes but no significant cellulitis. Imaging showed subcutaneous gas to right great toe but not to the left. He was seen by vascular surgery who will be doing angiogram on him tomorrow to assess what level of amputation. His infectious work up has shown MRSA in 1 set of blood cultures prelimiminary.     Past Medical History:  Diagnosis Date  . Anemia    a. mild, noted 04/2017.  [name] CAD in native artery    a. [name] 04/2017 s/p DES to D1, DES to prox-mid LAD, DES to prox LAD overlapping the prior stent, LVEF 55-65%.   . Chronic diastolic CHF (congestive heart failure) ([place])   . Diabetic ulcer of toe ([place])   . DJD  (degenerative joint disease) of cervical spine   . Essential hypertension, benign   . GERD (gastroesophageal reflux disease)   . Heart murmur   . History of hiatal hernia   . HIT (heparin-induced thrombocytopenia) ([place])   . Hypothyroidism   . Hypoxia    a. went home on home O2 04/2017.  [name] Insomnia   . Mixed hyperlipidemia   . PAD (peripheral artery disease) ([place])   . PAF (paroxysmal atrial fibrillation) ([place])   . PSVT (paroxysmal supraventricular tachycardia) ([place])   . PVD (peripheral vascular disease) ([place])   . Renal insufficiency   . 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  . Sinus bradycardia    a. HR 30s-40s in 04/2017 -> diltiazem stopped, metoprolol reduced.  . Sleep apnea    "chose not to order CPAP at this time" (05/18/2017)  . Type 2 diabetes mellitus ([place])   . Wheezing    a. suspected COPD 04/2017. Former tobacco x 40 years.    Allergies:  Allergies  Allergen Reactions  . Codeine Shortness Of Breath  . Heparin Other (See Comments)    +HIT,  Severe bleeding   . Losartan Swelling    Per ENT  . Oxycodone Other (See Comments)    "Made me act out of my mind"  . Other Other (See Comments)  Severe bleeding      MEDICATIONS: . amLODipine  10 mg Oral Daily  . atorvastatin  80 mg Oral QPM  . clopidogrel  75 mg Oral Daily  . insulin glargine  15 Units Subcutaneous BID  . lisinopril  5 mg Oral Daily  . metoprolol succinate  100 mg Oral Daily  . pantoprazole  40 mg Oral Daily  . senna-docusate  1 tablet Oral QHS  . tiotropium  18 mcg Inhalation Daily  . traZODone  100 mg Oral QHS  . vitamin B-12  2,500 mcg Oral Daily    Social History   Tobacco Use  . Smoking status: Former Smoker    Packs/day: 2.00    Years: 30.00    Pack years: 60.00    Types: Cigarettes    Start date: 03/28/1953    Last attempt to quit: 03/29/1983    Years since quitting: 34.4  . Smokeless tobacco: Never Used  Substance Use Topics  . Alcohol use: Yes    Comment: 05/18/2017 'I drink a  beer q yr"  . Drug use: No    Family History  Problem Relation Age of Onset  . Heart attack Father 37  . COPD Father   . COPD Mother   . Heart disease Mother   . Diabetes Mother   . Hypertension Sister   . CVA Sister   . Diabetes Sister   . Multiple sclerosis Sister     Review of Systems  Constitutional: positive for fever, chills, diaphoresis, activity change, appetite change, fatigue and unexpected weight change.  HENT: Negative for congestion, sore throat, rhinorrhea, sneezing, trouble swallowing and sinus pressure.  Eyes: Negative for photophobia and visual disturbance.  Respiratory: Negative for cough, chest tightness, shortness of breath, wheezing and stridor.  Cardiovascular: Negative for chest pain, palpitations and leg swelling.  Gastrointestinal: Negative for nausea, vomiting, abdominal pain, diarrhea, constipation, blood in stool, abdominal distention and anal bleeding.  Genitourinary: Negative for dysuria, hematuria, flank pain and difficulty urinating.  Musculoskeletal: Negative for myalgias, back pain, joint swelling, arthralgias and gait problem.  Skin: positive for color change to great toes, but negative for pallor, rash and wound.  Neurological: Negative for dizziness, tremors, weakness and light-headedness.  Hematological: Negative for adenopathy. Does not bruise/bleed easily.  Psychiatric/Behavioral: Negative for behavioral problems, confusion, sleep disturbance, dysphoric mood, decreased concentration and agitation.     OBJECTIVE: Temp:  [98.4 F (36.9 C)-99.2 F (37.3 C)] 98.6 F (37 C) (06/20 1354) Pulse Rate:  [78-92] 80 (06/20 1354) Resp:  [16-19] 18 (06/20 1354) BP: (111-150)/(64-93) 120/76 (06/20 1354) SpO2:  [92 %-95 %] 92 % (06/20 1354) Weight:  [213 lb 6.5 oz (96.8 kg)] 213 lb 6.5 oz (96.8 kg) (06/20 0500) Physical Exam  Constitutional: He is oriented to person, place, and time. He appears well-developed and well-nourished. No distress.    HENT:  Mouth/Throat: Oropharynx is clear and moist. No oropharyngeal exudate.  Cardiovascular: Normal rate, regular rhythm and normal heart sounds. Exam reveals no gallop and no friction rub.  No murmur heard.  Pulmonary/Chest: Effort normal and breath sounds normal. No respiratory distress. He has no wheezes.  Abdominal: Soft. Bowel sounds are normal. He exhibits no distension. There is no tenderness.  Lymphadenopathy:  He has no cervical adenopathy.  Neurological: He is alert and oriented to person, place, and time.  Ext: bilateral great toe, dry gangrene of left and right foot some skin has desquamated from base of great toe, foul odor. dessicated appearance to both toes.  Skin: Slight erythema about right dorsum of foot Psychiatric: He has a normal mood and affect. His behavior is normal.     LABS: Results for orders placed or performed during the hospital encounter of 09/13/17 (from the past 48 hour(s))  Culture, blood (Routine x 2)     Status: None (Preliminary result)   Collection Time: 09/13/17 12:06 PM  Result Value Ref Range   Specimen Description BLOOD RIGHT HAND    Special Requests      BOTTLES DRAWN AEROBIC ONLY Blood Culture results may not be optimal due to an inadequate volume of blood received in culture bottles   Culture      NO GROWTH 1 DAY Performed at [facility] [address] [place], [place] [address]    Report Status PENDING   Comprehensive metabolic panel     Status: Abnormal   Collection Time: 09/13/17  1:08 PM  Result Value Ref Range   Sodium 137 135 - 145 mmol/L   Potassium 3.8 3.5 - 5.1 mmol/L   Chloride 97 (L) 101 - 111 mmol/L   CO2 26 22 - 32 mmol/L   Glucose, Bld 197 (H) 65 - 99 mg/dL   BUN 12 6 - 20 mg/dL   Creatinine, Ser 1.03 0.61 - 1.24 mg/dL   Calcium 8.6 (L) 8.9 - 10.3 mg/dL   Total Protein 7.0 6.5 - 8.1 g/dL   Albumin 2.8 (L) 3.5 - 5.0 g/dL   AST 46 (H) 15 - 41 U/L   ALT 46 17 - 63 U/L   Alkaline Phosphatase 125 38 - 126 U/L    Total Bilirubin 0.9 0.3 - 1.2 mg/dL   GFR calc non Af Amer >60 >60 mL/min   GFR calc Af Amer >60 >60 mL/min    Comment: (NOTE) The eGFR has been calculated using the CKD EPI equation. This calculation has not been validated in all clinical situations. eGFR's persistently <60 mL/min signify possible Chronic Kidney Disease.    Anion gap 14 5 - 15    Comment: Performed at [facility] [address], [place], [place] [address]  CBC with Differential     Status: Abnormal   Collection Time: 09/13/17  1:08 PM  Result Value Ref Range   WBC 19.0 (H) 4.0 - 10.5 K/uL   RBC 3.92 (L) 4.22 - 5.81 MIL/uL   Hemoglobin 10.0 (L) 13.0 - 17.0 g/dL   HCT 32.1 (L) 39.0 - 52.0 %   MCV 81.9 78.0 - 100.0 fL   MCH 25.5 (L) 26.0 - 34.0 pg   MCHC 31.2 30.0 - 36.0 g/dL   RDW 16.8 (H) 11.5 - 15.5 %   Platelets 376 150 - 400 K/uL   Neutrophils Relative % 76 %   Neutro Abs 14.3 (H) 1.7 - 7.7 K/uL   Lymphocytes Relative 6 %   Lymphs Abs 1.1 0.7 - 4.0 K/uL   Monocytes Relative 15 %   Monocytes Absolute 2.9 (H) 0.1 - 1.0 K/uL   Eosinophils Relative 0 %   Eosinophils Absolute 0.0 0.0 - 0.7 K/uL   Basophils Relative 0 %   Basophils Absolute 0.1 0.0 - 0.1 K/uL   Immature Granulocytes 3 %   Abs Immature Granulocytes 0.6 (H) 0.0 - 0.1 K/uL    Comment: Performed at [facility] [address] [place], [place] [address]  Protime-INR     Status: Abnormal   Collection Time: 09/13/17  1:08 PM  Result Value Ref Range  Prothrombin Time 17.3 (H) 11.4 - 15.2 seconds   INR 1.43     Comment: Performed at [facility], [place] [address] [place], [place] [address]  Culture, blood (Routine x 2)     Status: None (Preliminary result)   Collection Time: 09/13/17  1:08 PM  Result Value Ref Range   Specimen Description BLOOD LEFT FOREARM    Special Requests      BOTTLES DRAWN AEROBIC AND ANAEROBIC Blood Culture adequate volume Performed at [facility], [place] [address], [place], [place] [address]     Culture  Setup Time      GRAM POSITIVE COCCI IN CLUSTERS IN BOTH AEROBIC AND ANAEROBIC BOTTLES Organism ID to follow CRITICAL RESULT CALLED TO, READ BACK BY AND VERIFIED WITH: Hughie Closs PharmD 12:10 09/14/17 ([email or web address])    Culture GRAM POSITIVE COCCI    Report Status PENDING   Blood Culture ID Panel (Reflexed)     Status: Abnormal   Collection Time: 09/13/17  1:08 PM  Result Value Ref Range   Enterococcus species NOT DETECTED NOT DETECTED   Listeria monocytogenes NOT DETECTED NOT DETECTED   Staphylococcus species DETECTED (A) NOT DETECTED    Comment: CRITICAL RESULT CALLED TO, READ BACK BY AND VERIFIED WITH: Hughie Closs PharmD 12:10 09/14/17 ([email or web address])    Staphylococcus aureus DETECTED (A) NOT DETECTED    Comment: Methicillin (oxacillin)-resistant Staphylococcus aureus (MRSA). MRSA is predictably resistant to beta-lactam antibiotics (except ceftaroline). Preferred therapy is vancomycin unless clinically contraindicated. Patient requires contact precautions if  hospitalized. CRITICAL RESULT CALLED TO, READ BACK BY AND VERIFIED WITH: Hughie Closs PharmD 12:10 09/14/17 ([email or web address])    Methicillin resistance DETECTED (A) NOT DETECTED    Comment: CRITICAL RESULT CALLED TO, READ BACK BY AND VERIFIED WITH: Hughie Closs PharmD 12:10 09/14/17 ([email or web address])    Streptococcus species NONE DETECTED (A) NOT DETECTED   Streptococcus agalactiae NOT DETECTED NOT DETECTED   Streptococcus pneumoniae NOT DETECTED NOT DETECTED   Streptococcus pyogenes NOT DETECTED NOT DETECTED   Acinetobacter baumannii NOT DETECTED NOT DETECTED   Enterobacteriaceae species NONE DETECTED (A) NOT DETECTED   Enterobacter cloacae complex NOT DETECTED NOT DETECTED   Escherichia coli NOT DETECTED NOT DETECTED   Klebsiella oxytoca NOT DETECTED NOT DETECTED   Klebsiella pneumoniae NOT DETECTED NOT DETECTED   Proteus species NOT DETECTED NOT DETECTED   Serratia marcescens NOT DETECTED NOT DETECTED   Haemophilus influenzae NOT DETECTED NOT  DETECTED   Neisseria meningitidis NOT DETECTED NOT DETECTED   Pseudomonas aeruginosa NOT DETECTED NOT DETECTED   Candida albicans NOT DETECTED NOT DETECTED   Candida glabrata NOT DETECTED NOT DETECTED   Candida krusei NOT DETECTED NOT DETECTED   Candida parapsilosis NOT DETECTED NOT DETECTED   Candida tropicalis NOT DETECTED NOT DETECTED  I-Stat CG4 Lactic Acid, ED     Status: Abnormal   Collection Time: 09/13/17  1:44 PM  Result Value Ref Range   Lactic Acid, Venous 2.42 (HH) 0.5 - 1.9 mmol/L   Comment NOTIFIED PHYSICIAN   Urinalysis, Routine w reflex microscopic     Status: Abnormal   Collection Time: 09/13/17  7:54 PM  Result Value Ref Range   Color, Urine YELLOW YELLOW   APPearance CLEAR CLEAR   Specific Gravity, Urine 1.017 1.005 - 1.030   pH 7.0 5.0 - 8.0   Glucose, UA NEGATIVE NEGATIVE mg/dL   Hgb urine dipstick NEGATIVE NEGATIVE   Bilirubin Urine NEGATIVE NEGATIVE   Ketones, ur NEGATIVE NEGATIVE mg/dL   Protein, ur 30 (  A) NEGATIVE mg/dL   Nitrite NEGATIVE NEGATIVE   Leukocytes, UA NEGATIVE NEGATIVE   RBC / HPF 0-5 0 - 5 RBC/hpf   WBC, UA 0-5 0 - 5 WBC/hpf   Bacteria, UA NONE SEEN NONE SEEN   Squamous Epithelial / LPF 0-5 0 - 5   Mucus PRESENT     Comment: Performed at [facility], [place] [address] [place], [place] [address]  Glucose, capillary     Status: Abnormal   Collection Time: 09/13/17 10:51 PM  Result Value Ref Range   Glucose-Capillary 199 (H) 65 - 99 mg/dL  Comprehensive metabolic panel     Status: Abnormal   Collection Time: 09/14/17  5:01 AM  Result Value Ref Range   Sodium 139 135 - 145 mmol/L   Potassium 4.3 3.5 - 5.1 mmol/L   Chloride 101 101 - 111 mmol/L   CO2 29 22 - 32 mmol/L   Glucose, Bld 120 (H) 65 - 99 mg/dL   BUN 11 6 - 20 mg/dL   Creatinine, Ser 0.92 0.61 - 1.24 mg/dL   Calcium 8.4 (L) 8.9 - 10.3 mg/dL   Total Protein 6.2 (L) 6.5 - 8.1 g/dL   Albumin 2.3 (L) 3.5 - 5.0 g/dL   AST 50 (H) 15 - 41 U/L   ALT 42 17 - 63 U/L   Alkaline  Phosphatase 112 38 - 126 U/L   Total Bilirubin 0.9 0.3 - 1.2 mg/dL   GFR calc non Af Amer >60 >60 mL/min   GFR calc Af Amer >60 >60 mL/min    Comment: (NOTE) The eGFR has been calculated using the CKD EPI equation. This calculation has not been validated in all clinical situations. eGFR's persistently <60 mL/min signify possible Chronic Kidney Disease.    Anion gap 9 5 - 15    Comment: Performed at [facility] [address], [place], [place] [address]  CBC     Status: Abnormal   Collection Time: 09/14/17  5:01 AM  Result Value Ref Range   WBC 15.4 (H) 4.0 - 10.5 K/uL   RBC 3.55 (L) 4.22 - 5.81 MIL/uL   Hemoglobin 9.1 (L) 13.0 - 17.0 g/dL   HCT 28.7 (L) 39.0 - 52.0 %   MCV 80.8 78.0 - 100.0 fL   MCH 25.6 (L) 26.0 - 34.0 pg   MCHC 31.7 30.0 - 36.0 g/dL   RDW 16.6 (H) 11.5 - 15.5 %   Platelets 310 150 - 400 K/uL    Comment: Performed at [facility], [place] [address] [place], [place] [address]  Hemoglobin A1c     Status: Abnormal   Collection Time: 09/14/17  5:01 AM  Result Value Ref Range   Hgb A1c MFr Bld 7.1 (H) 4.8 - 5.6 %    Comment: (NOTE) Pre diabetes:          5.7%-6.4% Diabetes:              >6.4% Glycemic control for   <7.0% adults with diabetes    Mean Plasma Glucose 157.07 mg/dL    Comment: Performed at [facility] [address] [place], [place] [address]  Sedimentation rate     Status: Abnormal   Collection Time: 09/14/17  5:01 AM  Result Value Ref Range   Sed Rate 96 (H) 0 - 16 mm/hr    Comment: Performed at [facility] [address], [place], [place] [address]  Glucose, capillary  Status: Abnormal   Collection Time: 09/14/17  6:25 AM  Result Value Ref Range   Glucose-Capillary 120 (H) 65 - 99 mg/dL  Glucose, capillary     Status: Abnormal   Collection Time: 09/14/17 11:54 AM  Result Value Ref Range   Glucose-Capillary 143 (H) 65 - 99 mg/dL    MICRO: 6/19 blood cx 1 of 2 sets MRSA IMAGING: Dg Foot Complete  Left  Result Date: 09/13/2017 CLINICAL DATA:  Black wound over the great toe. EXAM: LEFT FOOT - COMPLETE 3+ VIEW COMPARISON:  None. FINDINGS: No acute fracture joint dislocation of the left foot. Osteoarthritic joint space narrowing of the first MTP. Soft tissue irregularity at the tip of the great toe likely representing the site of reported wound. No radiopaque foreign body nor underlying bone destruction. Calcaneal enthesopathy is seen along the plantar aspect. Vascular calcifications are identified of the foot. IMPRESSION: 1. Soft tissue irregularity at the tip of the left great toe compatible with history of a soft tissue wound. No underlying evidence of osteomyelitis. 2. Mild osteoarthritic joint space narrowing of the first MTP. Electronically Signed   By: Ashley Royalty M.D.   On: 09/13/2017 18:00   Dg Foot Complete Right  Result Date: 09/13/2017 CLINICAL DATA:  Black wound on toe, evaluate gangrene. Wound since April 2019. EXAM: RIGHT FOOT COMPLETE - 3+ VIEW COMPARISON:  None. FINDINGS: No acute fracture deformity or dislocation. No destructive bony lesions. Moderate plantar calcaneal spur. Subcutaneous gas distal great toe. Moderate vascular calcifications. IMPRESSION: Subcutaneous gas RIGHT great toe consistent with history of gangrene. No radiographic findings of osteomyelitis. These results will be called to the ordering clinician or representative by the Radiologist Assistant, and communication documented in the PACS or zVision Dashboard. Electronically Signed   By: Elon Alas M.D.   On: 09/13/2017 17:57   Assessment/Plan:  83yo M with hx of CAD, HTN, PAF, DM c/b peripheral neuropathy with bilateral gangrenous toes and secondary MRSA bacteremia - will change vancomycin to daptomycin to decrease risk of nephrotoxicity since he is getting angiogram with possible stent placement - may consider giving abtx 48hr prior to stent placement - recommend to repeat blood cx today to document  clearance of bacteremia - if patient has ongoing fevers, would add GNR/anaerobic coverage such as piptazo - recommend to get TEE for determining if he has endocarditis - length of therapy will depend on TEE findings and what will be done from surgical standpoint of his gangrenous great toes.

## 2017-09-14 NOTE — Progress Notes (Signed)
Internal Medicine Attending:   I saw and examined the patient. I reviewed the resident's note and I agree with the resident's findings and plan as documented in the resident's note.  Hospital day #2 with bilateral great toe dry gangrene due to PAD and diabetes. He is doing well this morning, pain well controlled, spirits are high. We are grateful for vascular surgery consultation, planning for angiogram tomorrow to help with planning about level of amputation surgeries. One blood culture on admission now has MRSA via biofire, so treating with vancomycin. We will repeat blood cultures as well, and obtain an echo to rule out valvular lesion. I assume this may delay any vascular procedure until we can clear the bacteremia.

## 2017-09-14 NOTE — Progress Notes (Signed)
PHARMACY - PHYSICIAN COMMUNICATION CRITICAL VALUE ALERT - BLOOD CULTURE IDENTIFICATION (BCID)  Mike Wright is an 84 y.o. male who presented to [facility] on 09/13/2017   Assessment: 1/2 MRSA Bacteremia  Name of physician (or Provider) Contacted: Dr Berline Lopes  Current antibiotics: None, ceftriaxone dc earlier today  Changes to prescribed antibiotics recommended:  Add vanc Auto ID consult  Results for orders placed or performed during the hospital encounter of 09/13/17  Blood Culture ID Panel (Reflexed) (Collected: 09/13/2017  1:08 PM)  Result Value Ref Range   Enterococcus species NOT DETECTED NOT DETECTED   Listeria monocytogenes NOT DETECTED NOT DETECTED   Staphylococcus species DETECTED (A) NOT DETECTED   Staphylococcus aureus DETECTED (A) NOT DETECTED   Methicillin resistance DETECTED (A) NOT DETECTED   Streptococcus species NONE DETECTED (A) NOT DETECTED   Streptococcus agalactiae NOT DETECTED NOT DETECTED   Streptococcus pneumoniae NOT DETECTED NOT DETECTED   Streptococcus pyogenes NOT DETECTED NOT DETECTED   Acinetobacter baumannii NOT DETECTED NOT DETECTED   Enterobacteriaceae species NONE DETECTED (A) NOT DETECTED   Enterobacter cloacae complex NOT DETECTED NOT DETECTED   Escherichia coli NOT DETECTED NOT DETECTED   Klebsiella oxytoca NOT DETECTED NOT DETECTED   Klebsiella pneumoniae NOT DETECTED NOT DETECTED   Proteus species NOT DETECTED NOT DETECTED   Serratia marcescens NOT DETECTED NOT DETECTED   Haemophilus influenzae NOT DETECTED NOT DETECTED   Neisseria meningitidis NOT DETECTED NOT DETECTED   Pseudomonas aeruginosa NOT DETECTED NOT DETECTED   Candida albicans NOT DETECTED NOT DETECTED   Candida glabrata NOT DETECTED NOT DETECTED   Candida krusei NOT DETECTED NOT DETECTED   Candida parapsilosis NOT DETECTED NOT DETECTED   Candida tropicalis NOT DETECTED NOT DETECTED   Levester Fresh, PharmD, BCPS, BCCCP Clinical Pharmacist Clinical phone for 09/14/2017  from 7a-3:30p: [phone] If after 3:30p, please call main pharmacy at: [phone] 09/14/2017 12:53 PM

## 2017-09-14 NOTE — Progress Notes (Addendum)
Vascular prelim: LE arterial duplex limited: no evidence of aneurysm bilateral popliteal arteries. Landry Mellow, RDMS, RVT

## 2017-09-14 NOTE — Progress Notes (Signed)
Subjective: The patient was resting in his bed today upon entering the room. He denied acute concerns overnight and is willing to stay until his evaluation and surgery is completed. He is aware that he will likely need bilateral amputations following the angiogram which will better permit determination of the extent of the amputations. He requested specifically that the amputations requirements and extent be discussed with him prior being performed.   Objective:  Vital signs in last 24 hours: Vitals:   09/13/17 1530 09/13/17 2028 09/14/17 0407 09/14/17 0500  BP: 134/64 (!) 150/70 (!) 111/93   Pulse: 92 90 78   Resp: _0 Temp:  99.2 F (37.3 C) 98.4 F (36.9 C)   TempSrc:  Oral Oral   SpO2: 95% 95% 93%   Weight:    213 lb 6.5 oz (96.8 kg)  Height:       Physical Exam  Constitutional: He appears well-developed and well-nourished. No distress.  Cardiovascular: Normal rate and regular rhythm.  No murmur heard. Pulmonary/Chest: Effort normal and breath sounds normal. No stridor. No respiratory distress.  Abdominal: Soft. Bowel sounds are normal. He exhibits no distension.  Musculoskeletal: He exhibits tenderness (To the 1st digits of each toe ) and deformity. He exhibits no edema.  Neurological: He is alert.  Skin: Skin is warm. He is not diaphoretic. There is erythema (But improved from the admission).  Psychiatric: He has a normal mood and affect.  Vitals reviewed.  Assessment/Plan:  Active Problems:   Diabetes ([place])   Essential hypertension   Paroxysmal supraventricular tachycardia (HCC)   CAD (coronary artery disease)   Chronic diastolic CHF (congestive heart failure) (HCC)   COPD (chronic obstructive pulmonary disease) (HCC)   Status post coronary artery stent placement   Gangrene [facility])  Assessment: Mr. Yeo is an 84 yo M with PMHx notable for CAD ( s/p stenting x2 to LAD and x1 to diag in February 2019), atrial fibrillation with paroxysmal supraventricular  tachycardia, type 2 diabetes ( on insulin) who presents for evaluation of pain and blackening of his large toes on both feet. The presentation is concerning for severe distal PAD with secondary dry gangrene with concern for secondary infection given that the skin barrier was broken multiple times in a non sterile environment. He was admitted for evaluation and inpatient intervention.   Plan: Possible Ischemic limb: As above, there is notable ischemia of the distal first digits of each foot distal to the MTP joints bilaterally. This is most concerning for ischemia w/ secondary infection of the right foot most likely soft tissue involvement. --Vascular saw the patient and are agreeable to completing peripheral cath and considering intervention on Friday after. --CTX 2g daily for probable cellulitis and concern for osteomyelitis x 1 dose given. Discontinuing antibiotics as patient appears stable and no clear signs of acute infection --CBC daily --DG feet bilaterally to better assess for osteo, no clear signs of osteo but ESR elevated.  P-A-fib: On Eliquis at home. Holding this in anticipation of a procedure by vascular surgery. 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  --Holding Eliquis as above --HIT antibody pending --Ordered EKG  Type 2 DM  No recent A1c on file. 7.1% on admission. On lantus 40 mg qAM 30 mg qPM at home. --Decreasing Lanuts due to impending NPO status for am treatment window --Continue Lantus at 30U BID following procedure, will increase if his PO intake improves --CBG with meals and QHS, No SSI  unless his Lantus is inadequate  --BMP daily  CAD: S/P LHC in 04/2017 with drug eluting stents placed. Will need to remain on Plavix. Denied ongoing chest pain.  --Continued plavix 22m daily --PT-INR completed, 17.3/1.43 --Hgb 9.1, Plt 310  HTN: BP 111/93. We will continue his home Amlodipine 156mdaily, and Lisinopril 21m42maily.  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   HarbKathi Ludwig 09/14/2017, 8:17 AM Pager: Pager# [phone]

## 2017-09-14 NOTE — Progress Notes (Signed)
Pharmacy Antibiotic Note  Mike Wright is a 84 y.o. male admitted on 09/13/2017 with pain and swelling in toes.  Found to have gangrene/cellulitis and possible ischemic limb.  Imaging negative for osteomyelitis.  BCID returned as MRSA and Pharmacy has been consulted for vancomycin dosing.  SCr 0.92, CrCL 70 ml/min, afebrile, WBC down to 15.4, LA 2.42.  Plan: Vanc 2gm IV x 1, then 1250mg  IV Q12H Monitor renal fxn, clinical progress, vanc trough at Css   Height: 5\' 7"  (170.2 cm) Weight: 213 lb 6.5 oz (96.8 kg) IBW/kg (Calculated) : 66.1  Temp (24hrs), Avg:98.8 F (37.1 C), Min:98.4 F (36.9 C), Max:99.2 F (37.3 C)  Recent Labs  Lab 09/13/17 1308 09/13/17 1344 09/14/17 0501  WBC 19.0*  --  15.4*  CREATININE 1.03  --  0.92  LATICACIDVEN  --  2.42*  --     Estimated Creatinine Clearance: 69.8 mL/min (by C-G formula based on SCr of 0.92 mg/dL).    Allergies  Allergen Reactions  . Codeine Shortness Of Breath  . Heparin Other (See Comments)    +HIT,  Severe bleeding   . Losartan Swelling    Per ENT  . Oxycodone Other (See Comments)    "Made me act out of my mind"  . Other Other (See Comments)    Severe bleeding     CTX 6/19 >> 6/20 Vanc 6/20 >>  6/19 BCx - GPC (BCID MRSA)   Mike Wright D. Mina Marble, PharmD, BCPS, [place] Pager:  [phone] 09/14/2017, 12:39 PM

## 2017-09-14 NOTE — Progress Notes (Signed)
Angio tomorrow.  Npo p midnight.  Consent.  Ruta Hinds, MD [facility] Office: [phone] Pager: [phone]

## 2017-09-15 ENCOUNTER — Encounter (HOSPITAL_COMMUNITY): Payer: Self-pay | Admitting: Cardiology

## 2017-09-15 ENCOUNTER — Inpatient Hospital Stay (HOSPITAL_COMMUNITY): Payer: Medicare HMO

## 2017-09-15 ENCOUNTER — Inpatient Hospital Stay (HOSPITAL_COMMUNITY)
Admission: EM | Disposition: A | Discharge status: Skilled Nursing Facility | Payer: Self-pay | Source: Home / Self Care | Attending: Student in an Organized Health Care Education/Training Program

## 2017-09-15 DIAGNOSIS — R011 Cardiac murmur, unspecified: Secondary | ICD-10-CM

## 2017-09-15 DIAGNOSIS — R7881 Bacteremia: Secondary | ICD-10-CM | POA: Diagnosis present

## 2017-09-15 DIAGNOSIS — I340 Nonrheumatic mitral (valve) insufficiency: Secondary | ICD-10-CM

## 2017-09-15 HISTORY — PX: PERIPHERAL VASCULAR BALLOON ANGIOPLASTY: CATH118281

## 2017-09-15 HISTORY — PX: ABDOMINAL AORTOGRAM W/LOWER EXTREMITY: CATH118223

## 2017-09-15 LAB — GLUCOSE, CAPILLARY
GLUCOSE-CAPILLARY: 134 mg/dL — AB (ref 65–99)
GLUCOSE-CAPILLARY: 182 mg/dL — AB (ref 65–99)
Glucose-Capillary: 150 mg/dL — ABNORMAL HIGH (ref 65–99)
Glucose-Capillary: 135 mg/dL — ABNORMAL HIGH (ref 65–99)

## 2017-09-15 LAB — CBC
HCT: 29.2 % — ABNORMAL LOW (ref 39.0–52.0)
Hemoglobin: 9.3 g/dL — ABNORMAL LOW (ref 13.0–17.0)
MCH: 25.5 pg — ABNORMAL LOW (ref 26.0–34.0)
MCHC: 31.8 g/dL (ref 30.0–36.0)
MCV: 80.2 fL (ref 78.0–100.0)
Platelets: 323 10*3/uL (ref 150–400)
RBC: 3.64 MIL/uL — ABNORMAL LOW (ref 4.22–5.81)
RDW: 16.4 % — ABNORMAL HIGH (ref 11.5–15.5)
WBC: 16.7 10*3/uL — ABNORMAL HIGH (ref 4.0–10.5)

## 2017-09-15 LAB — POCT ACTIVATED CLOTTING TIME: ACTIVATED CLOTTING TIME: 312 s

## 2017-09-15 LAB — PROTIME-INR
INR: 1.32
Prothrombin Time: 16.3 seconds — ABNORMAL HIGH (ref 11.4–15.2)

## 2017-09-15 LAB — BASIC METABOLIC PANEL
Anion gap: 9 (ref 5–15)
BUN: 10 mg/dL (ref 6–20)
CALCIUM: 8.1 mg/dL — AB (ref 8.9–10.3)
CHLORIDE: 101 mmol/L (ref 101–111)
CO2: 25 mmol/L (ref 22–32)
CREATININE: 0.81 mg/dL (ref 0.61–1.24)
GFR calc non Af Amer: >60 mL/min (ref >60)
Glucose, Bld: 149 mg/dL — ABNORMAL HIGH (ref 65–99)
Potassium: 3.8 mmol/L (ref 3.5–5.1)
Sodium: 135 mmol/L (ref 135–145)

## 2017-09-15 LAB — CK: Total CK: 53 U/L (ref 49–397)

## 2017-09-15 LAB — MRSA PCR SCREENING: MRSA BY PCR: POSITIVE — AB

## 2017-09-15 LAB — ECHOCARDIOGRAM COMPLETE
HEIGHTINCHES: 67 in
WEIGHTICAEL: 3414.48 [oz_av]

## 2017-09-15 SURGERY — ABDOMINAL AORTOGRAM W/LOWER EXTREMITY
Anesthesia: LOCAL

## 2017-09-15 MED ORDER — SODIUM CHLORIDE 0.9% FLUSH
3.0000 mL | Freq: Two times a day (BID) | INTRAVENOUS | Status: DC
  Administered 2017-09-16 – 2017-09-25 (×7): 3 mL via INTRAVENOUS

## 2017-09-15 MED ORDER — FENTANYL CITRATE (PF) 100 MCG/2ML IJ SOLN
INTRAMUSCULAR | Status: AC
  Filled 2017-09-15: qty 2

## 2017-09-15 MED ORDER — WHITE PETROLATUM EX OINT
TOPICAL_OINTMENT | CUTANEOUS | Status: AC
  Administered 2017-09-15: 0.2
  Filled 2017-09-15: qty 28.35

## 2017-09-15 MED ORDER — IODIXANOL 320 MG/ML IV SOLN
INTRAVENOUS | Status: DC | PRN
  Administered 2017-09-15: 150 mL via INTRA_ARTERIAL

## 2017-09-15 MED ORDER — SODIUM CHLORIDE 0.9 % WEIGHT BASED INFUSION
1.0000 mL/kg/h | INTRAVENOUS | Status: DC
  Administered 2017-09-15: 1 mL/kg/h via INTRAVENOUS

## 2017-09-15 MED ORDER — ASPIRIN EC 81 MG PO TBEC
81.0000 mg | DELAYED_RELEASE_TABLET | Freq: Every day | ORAL | Status: DC
  Administered 2017-09-15 – 2017-09-21 (×6): 81 mg via ORAL
  Filled 2017-09-15 (×6): qty 1

## 2017-09-15 MED ORDER — BIVALIRUDIN TRIFLUOROACETATE 250 MG IV SOLR
INTRAVENOUS | Status: AC
  Filled 2017-09-15: qty 250

## 2017-09-15 MED ORDER — HEPARIN (PORCINE) IN NACL 1000-0.9 UT/500ML-% IV SOLN
INTRAVENOUS | Status: AC
  Filled 2017-09-15: qty 500

## 2017-09-15 MED ORDER — SODIUM CHLORIDE 0.9 % IV SOLN
INTRAVENOUS | Status: DC | PRN
  Administered 2017-09-15: 1.75 mg/kg/h via INTRAVENOUS

## 2017-09-15 MED ORDER — ENOXAPARIN SODIUM 40 MG/0.4ML ~~LOC~~ SOLN
40.0000 mg | SUBCUTANEOUS | Status: DC
  Filled 2017-09-15: qty 0.4

## 2017-09-15 MED ORDER — LIDOCAINE HCL (PF) 1 % IJ SOLN
INTRAMUSCULAR | Status: AC
  Filled 2017-09-15: qty 30

## 2017-09-15 MED ORDER — VERAPAMIL HCL 2.5 MG/ML IV SOLN
INTRAVENOUS | Status: AC
  Filled 2017-09-15: qty 2

## 2017-09-15 MED ORDER — MUPIROCIN 2 % EX OINT
1.0000 "application " | TOPICAL_OINTMENT | Freq: Two times a day (BID) | CUTANEOUS | Status: AC
  Administered 2017-09-15 – 2017-09-19 (×10): 1 via NASAL
  Filled 2017-09-15 (×5): qty 22

## 2017-09-15 MED ORDER — NITROGLYCERIN IN D5W 200-5 MCG/ML-% IV SOLN
INTRAVENOUS | Status: AC
  Filled 2017-09-15: qty 250

## 2017-09-15 MED ORDER — ACETAMINOPHEN 325 MG PO TABS
650.0000 mg | ORAL_TABLET | ORAL | Status: DC | PRN
  Administered 2017-09-16 – 2017-09-24 (×6): 650 mg via ORAL
  Filled 2017-09-15 (×6): qty 2

## 2017-09-15 MED ORDER — SODIUM CHLORIDE 0.9 % IV SOLN
775.0000 mg | INTRAVENOUS | Status: DC
  Administered 2017-09-15 – 2017-09-17 (×3): 775 mg via INTRAVENOUS
  Filled 2017-09-15 (×4): qty 15.5

## 2017-09-15 MED ORDER — BIVALIRUDIN BOLUS VIA INFUSION - CUPID
INTRAVENOUS | Status: DC | PRN
  Administered 2017-09-15: 72.6 mg via INTRAVENOUS

## 2017-09-15 MED ORDER — FENTANYL CITRATE (PF) 100 MCG/2ML IJ SOLN
INTRAMUSCULAR | Status: DC | PRN
  Administered 2017-09-15: 50 ug via INTRAVENOUS

## 2017-09-15 MED ORDER — MIDAZOLAM HCL 2 MG/2ML IJ SOLN
INTRAMUSCULAR | Status: AC
  Filled 2017-09-15: qty 2

## 2017-09-15 MED ORDER — ONDANSETRON HCL 4 MG/2ML IJ SOLN
4.0000 mg | Freq: Four times a day (QID) | INTRAMUSCULAR | Status: DC | PRN
  Administered 2017-09-24: 4 mg via INTRAVENOUS
  Filled 2017-09-15: qty 2

## 2017-09-15 MED ORDER — SODIUM CHLORIDE 0.9% FLUSH: 3.0000 mL | INTRAVENOUS | Status: DC | PRN

## 2017-09-15 MED ORDER — PIPERACILLIN-TAZOBACTAM 3.375 G IVPB
3.3750 g | Freq: Three times a day (TID) | INTRAVENOUS | Status: DC
  Administered 2017-09-16 – 2017-09-21 (×14): 3.375 g via INTRAVENOUS
  Filled 2017-09-15 (×19): qty 50

## 2017-09-15 MED ORDER — HYDRALAZINE HCL 20 MG/ML IJ SOLN: 5.0000 mg | INTRAMUSCULAR | Status: DC | PRN

## 2017-09-15 MED ORDER — LIDOCAINE HCL (PF) 1 % IJ SOLN
INTRAMUSCULAR | Status: DC | PRN
  Administered 2017-09-15: 20 mL

## 2017-09-15 MED ORDER — MIDAZOLAM HCL 2 MG/2ML IJ SOLN
INTRAMUSCULAR | Status: DC | PRN
  Administered 2017-09-15: 2 mg via INTRAVENOUS

## 2017-09-15 MED ORDER — LABETALOL HCL 5 MG/ML IV SOLN: 10.0000 mg | INTRAVENOUS | Status: DC | PRN

## 2017-09-15 MED ORDER — SODIUM CHLORIDE 0.9 % IV SOLN
250.0000 mL | INTRAVENOUS | Status: DC | PRN
  Administered 2017-09-19 – 2017-09-21 (×2): 250 mL via INTRAVENOUS

## 2017-09-15 MED ORDER — CHLORHEXIDINE GLUCONATE CLOTH 2 % EX PADS
6.0000 | MEDICATED_PAD | Freq: Every day | CUTANEOUS | Status: AC
  Administered 2017-09-15 – 2017-09-19 (×6): 6 via TOPICAL

## 2017-09-15 SURGICAL SUPPLY — 25 items
BALLN STERLING OTW 2X220X150 (BALLOONS) ×3
BALLN STERLING OTW 3X220X150 (BALLOONS) ×3
BALLN STERLING OTW 3X60X150 (BALLOONS) ×3
BALLOON STERLING OTW 2X220X150 (BALLOONS) ×2 IMPLANT
BALLOON STERLING OTW 3X220X150 (BALLOONS) ×2 IMPLANT
BALLOON STERLING OTW 3X60X150 (BALLOONS) ×2 IMPLANT
CATH OMNI FLUSH 5F 65CM (CATHETERS) ×3 IMPLANT
CATH QUICKCROSS ANG SELECT (CATHETERS) ×3 IMPLANT
COVER PRB 48X5XTLSCP FOLD TPE (BAG) ×2 IMPLANT
COVER PROBE 5X48 (BAG) ×1
DEVICE CLOSURE MYNXGRIP 6/7F (Vascular Products) ×3 IMPLANT
DRAPE ZERO GRAVITY STERILE (DRAPES) ×3 IMPLANT
KIT ENCORE 26 ADVANTAGE (KITS) ×3 IMPLANT
KIT MICROPUNCTURE NIT STIFF (SHEATH) ×3 IMPLANT
KIT PV (KITS) ×3 IMPLANT
LUBRICANT VIPERSLIDE CORONARY (MISCELLANEOUS) IMPLANT
SHEATH GUIDING CAROTID 6FRX90 (SHEATH) ×3 IMPLANT
SHEATH PINNACLE 5F 10CM (SHEATH) ×3 IMPLANT
SHEATH PINNACLE 6F 10CM (SHEATH) ×3 IMPLANT
SYR MEDRAD MARK V 150ML (SYRINGE) ×3 IMPLANT
TRANSDUCER W/STOPCOCK (MISCELLANEOUS) ×3 IMPLANT
TRAY PV CATH (CUSTOM PROCEDURE TRAY) ×3 IMPLANT
WIRE BENTSON .035X145CM (WIRE) ×3 IMPLANT
WIRE G V18X300CM (WIRE) ×3 IMPLANT
WIRE HI TORQ VERSACORE J 260CM (WIRE) ×3 IMPLANT

## 2017-09-15 NOTE — Evaluation (Signed)
Physical Therapy Evaluation Patient Details Name: Mike Wright MRN: [id number] DOB: Aug 16, 1933 Today's Date: 09/15/2017   History of Present Illness  Pt is an 84 y/o male admitted secondary to bilat great toe ischemic gangrene. Per RN for angiogram possibly on 6/21, and will likely have great toe amputation. PMH includes DM, HTN, CAD, dCHF, COPD, and stent placement.   Clinical Impression  Pt admitted secondary to problem above with deficits below. Pt with increased pain during ambulation, therefore distance limited to within the room. Required min to min guard A for mobility with cane around the room. Per pt and RN, likely for angiogram on 6/21, and may need toe amputation. Will follow acutely to maximize functional mobility independence and safety and update recommendations following procedures.     Follow Up Recommendations Other (comment);Supervision for mobility/OOB(TBD following procedures )    Equipment Recommendations  None recommended by PT    Recommendations for Other Services OT consult     Precautions / Restrictions Precautions Precautions: Fall Restrictions Weight Bearing Restrictions: No      Mobility  Bed Mobility Overal bed mobility: Modified Independent                Transfers Overall transfer level: Needs assistance Equipment used: Straight cane Transfers: Sit to/from Stand Sit to Stand: Min assist         General transfer comment: Min A for steadying assist to stand. Increased time required.   Ambulation/Gait Ambulation/Gait assistance: Min guard Gait Distance (Feet): 15 Feet Assistive device: Straight cane Gait Pattern/deviations: Step-through pattern;Decreased stride length;Antalgic;Trunk flexed Gait velocity: Decreased    General Gait Details: Slow, antalgic gait. Mild unsteadiness noted, however, no overt LOB. Toe out bilaterally during gait. Distance limited to within the room secondary to increased pain. Educated about using RW for  increased support.   Stairs            Wheelchair Mobility    Modified Rankin (Stroke Patients Only)       Balance Overall balance assessment: Needs assistance Sitting-balance support: No upper extremity supported;Feet supported Sitting balance-Leahy Scale: Fair     Standing balance support: Single extremity supported;During functional activity Standing balance-Leahy Scale: Poor Standing balance comment: Reliant on UE support                              Pertinent Vitals/Pain Pain Assessment: Faces Faces Pain Scale: Hurts even more Pain Location: bilat great toes  Pain Descriptors / Indicators: Sharp;Grimacing Pain Intervention(s): Limited activity within patient's tolerance;Monitored during session;Repositioned    Home Living Family/patient expects to be discharged to:: Private residence Living Arrangements: Spouse/significant other Available Help at Discharge: Family;Available 24 hours/day Type of Home: House Home Access: Stairs to enter Entrance Stairs-Rails: Left Entrance Stairs-Number of Steps: 2 Home Layout: Two level;Able to live on main level with bedroom/bathroom Home Equipment: [name] - 2 wheels;Cane - single point;Bedside commode      Prior Function Level of Independence: Independent with assistive device(s)         Comments: Had been using cane over the past week secondary to increased pain.      Hand Dominance        Extremity/Trunk Assessment   Upper Extremity Assessment Upper Extremity Assessment: Defer to OT evaluation    Lower Extremity Assessment Lower Extremity Assessment: LLE deficits/detail;RLE deficits/detail;Generalized weakness RLE Deficits / Details: R great toe gangrene noted.  LLE Deficits / Details: L great toe gangrene noted.  Communication   Communication: No difficulties  Cognition Arousal/Alertness: Awake/alert Behavior During Therapy: WFL for tasks assessed/performed Overall Cognitive  Status: Within Functional Limits for tasks assessed                                        General Comments General comments (skin integrity, edema, etc.): Pt's wife present during session.     Exercises     Assessment/Plan    PT Assessment Patient needs continued PT services  PT Problem List Decreased strength;Decreased balance;Decreased activity tolerance;Decreased mobility;Decreased knowledge of use of DME;Pain       PT Treatment Interventions DME instruction;Gait training;Stair training;Functional mobility training;Therapeutic activities;Therapeutic exercise;Balance training;Patient/family education    PT Goals (Current goals can be found in the Care Plan section)  Acute Rehab PT Goals Patient Stated Goal: to go home  PT Goal Formulation: With patient Time For Goal Achievement: 09/29/17 Potential to Achieve Goals: Good    Frequency Min 3X/week   Barriers to discharge        Co-evaluation               AM-PAC PT "6 Clicks" Daily Activity  Outcome Measure Difficulty turning over in bed (including adjusting bedclothes, sheets and blankets)?: None Difficulty moving from lying on back to sitting on the side of the bed? : A Little Difficulty sitting down on and standing up from a chair with arms (e.g., wheelchair, bedside commode, etc,.)?: Unable Help needed moving to and from a bed to chair (including a wheelchair)?: A Little Help needed walking in hospital room?: A Little Help needed climbing 3-5 steps with a railing? : A Lot 6 Click Score: 16    End of Session Equipment Utilized During Treatment: Gait belt Activity Tolerance: Patient limited by pain Patient left: in bed;with call bell/phone within reach;with family/visitor present Nurse Communication: Mobility status PT Visit Diagnosis: Unsteadiness on feet (R26.81);Other abnormalities of gait and mobility (R26.89);Pain Pain - Right/Left: (bilat ) Pain - part of body: (toes )    Time:  1130-1147 PT Time Calculation (min) (ACUTE ONLY): 17 min   Charges:   PT Evaluation $PT Eval Moderate Complexity: 1 Mod     PT G Codes:        Leighton Ruff, PT, DPT  Acute Rehabilitation Services  Pager: [phone]   Rudean Hitt 09/15/2017, 11:54 AM

## 2017-09-15 NOTE — Progress Notes (Signed)
    [facility] has been requested to perform a transesophageal echocardiogram on [name] for bacteremia.  After careful review of history and examination, the risks and benefits of transesophageal echocardiogram have been explained including risks of esophageal damage, perforation (1:10,000 risk), bleeding, pharyngeal hematoma as well as other potential complications associated with conscious sedation including aspiration, arrhythmia, respiratory failure and death. Alternatives to treatment were discussed, questions were answered. Patient is willing to proceed.  Scheduled for 1 pm 09/18/17.  Family in room as well.  Pt to have another procedure on other leg.  Not sure of date but next week.   Cecilie Kicks, NP  09/15/2017 4:31 PM

## 2017-09-15 NOTE — Progress Notes (Signed)
Pharmacy Antibiotic Note  Mike Wright is a 84 y.o. male admitted on 09/13/2017 with pain and swelling in toes.  Found to have gangrene/cellulitis and possible ischemic limb.  Imaging negative for osteomyelitis. Pt also with MRSA bacteremia.   CTX 6/19 >> 6/20 Dapto 6/20 >> Zosyn 6/21 >>  6/19 BCx - 1/2 GPC (BCID MRSA)  Plan: -Daptomycin 8 mg/kg IV q24h -Zosyn 3.375 g IV q8h -Monitor renal fx, cultures -CK once/week   Height: 5\' 7"  (170.2 cm) Weight: 213 lb 6.5 oz (96.8 kg) IBW/kg (Calculated) : 66.1  Temp (24hrs), Avg:98.4 F (36.9 C), Min:98.1 F (36.7 C), Max:98.7 F (37.1 C)  Recent Labs  Lab 09/13/17 1308 09/13/17 1344 09/14/17 0501 09/15/17 0638  WBC 19.0*  --  15.4* 16.7*  CREATININE 1.03  --  0.92 0.81  LATICACIDVEN  --  2.42*  --   --       Allergies  Allergen Reactions  . Codeine Shortness Of Breath  . Heparin Other (See Comments)    +HIT,  Severe bleeding   . Losartan Swelling    Per ENT  . Oxycodone Other (See Comments)    "Made me act out of my mind"  . Other Other (See Comments)    Severe bleeding       Harvel Quale 09/15/2017 5:01 PM

## 2017-09-15 NOTE — Plan of Care (Signed)
  Problem: Coping: Goal: Level of anxiety will decrease Outcome: Progressing   Problem: Tissue Perfusion: Goal: Adequacy of tissue perfusion will improve Outcome: Progressing

## 2017-09-15 NOTE — Progress Notes (Signed)
Patient had successful revascularization of the left leg today.  He will need the right leg done as well.  I would like to give him some time to recover from his procedure today, and so I have scheduled him for angiogram with intervention of the right leg on July 2nd.  From my perspective, he can restart his Eliquis tomorrow with plans for angiogram in a week as long as no urgent intervention is needed on his toes prior to this.  WElls Brabham

## 2017-09-15 NOTE — Interval H&P Note (Signed)
History and Physical Interval Note:  09/15/2017 1:47 PM  Mike Wright  has presented today for surgery, with the diagnosis of pvd grangren  The various methods of treatment have been discussed with the patient and family. After consideration of risks, benefits and other options for treatment, the patient has consented to  Procedure(s): ABDOMINAL AORTOGRAM W/LOWER EXTREMITY (N/A) as a surgical intervention .  The patient's history has been reviewed, patient examined, no change in status, stable for surgery.  I have reviewed the patient's chart and labs.  Questions were answered to the patient's satisfaction.     [name]

## 2017-09-15 NOTE — Progress Notes (Signed)
C/o discomfort to abd. Bladder scan done with >657 present. Dr. Trula Slade notified and order for f/c to be placed. F/C placed see flowsheet.

## 2017-09-15 NOTE — Progress Notes (Signed)
ID PROGRESS NOTE    Afebrile, awaiting procedure today.   A/P: 84yo M with CAD, likely PAD admitted for gangrenous changes to great toes bilaterally with MRSA bacteremia  - continue on daptomycin 8mg /kg/day and piptazo for GNR/anaerobic coverage - await to hear results for angiogram and surgical intervention for the patient - repeat blood cx today - please get TTE but ideally needs TEE possibly can do in the OR if getting any surgery - leukocytosis = anticipate that may stay elevated until we get source control  Dr Linus Salmons will be available for questions over the weekend. I will see back on Monday  Jaelene Garciagarcia B. [facility] [phone]

## 2017-09-15 NOTE — Progress Notes (Signed)
Subjective: The patient was resting in his bed this morning upon entering the room. He denied pain, dyspnea, chest pain, fever, chills, or discomfort. He is mildly and appropriately anxious with regard to his planned procedure but remains in agreement with the plan.  Objective:  Vital signs in last 24 hours: Vitals:   09/14/17 1052 09/14/17 1354 09/14/17 1941 09/15/17 0428  BP: 126/64 120/76 135/69 136/83  Pulse: 79 80 81 89  Resp:  18 18 18   Temp:  98.6 F (37 C) 98.7 F (37.1 C) 98.4 F (36.9 C)  TempSrc:  Oral Oral Oral  SpO2:  92% 92% 97%  Weight:      Height:       Physical Exam  Constitutional: He is oriented to person, place, and time. He appears well-developed and well-nourished. No distress.  Cardiovascular: Normal rate and regular rhythm.  Murmur heard.  Systolic murmur is present. Pulmonary/Chest: Effort normal and breath sounds normal. No stridor. No respiratory distress.  Musculoskeletal: He exhibits deformity (Bilateral gangrenous 1st digits of each foot to ~the MTP joint externally). He exhibits no edema or tenderness.  Neurological: He is alert and oriented to person, place, and time.  Skin: Skin is warm. He is not diaphoretic. There is erythema (Mild and distal to the affected toes).  Psychiatric: He has a normal mood and affect.   Assessment/Plan:  Active Problems:   Diabetes ([place])   Essential hypertension   Paroxysmal supraventricular tachycardia (HCC)   CAD (coronary artery disease)   Chronic diastolic CHF (congestive heart failure) (HCC)   COPD (chronic obstructive pulmonary disease) ([place])   Status post coronary artery stent placement   Gangrene [facility])  Assessment: Mr. Feigel is an 84yo Mwith PMHx notable forCAD ( s/p stenting x2 to LAD and x1 to diag in February 2019), atrial fibrillation with paroxysmal supraventricular tachycardia, type 2 diabetes ( on insulin) who presents for evaluation of pain and blackening of his large toes on both feet.  The presentation is concerning for severe distal PAD with secondary dry gangrene with concern for secondary infection given that the skin barrier was broken multiple times in a non-sterile environment. He was admitted for evaluation and inpatient intervention.   Plan: Possible Ischemic limb: As above, there is notable ischemia of the distalfirst digits of each foot distal to the MTP joints bilaterally. This is most concerning for ischemia w/ secondary infection of the right foot most likely soft tissue involvement. The patient was noted to have MRSA bacteremia and as such was started on Vancomycin initially but in anticipation of the angiogram he was appropriately transitioned to Daptomycin by Dr. Graylon Good with ID.  --Vascular scheduled the patient for an angiogram to better determine the level of intervention necessary. --Daptomycin 8mg /kg x 96.8kg --Repeat Blood Cx ordered --CBC daily, stable Hgb with minimal variation in WBC count  P-A-fib: On Eliquis at home.Holding this in anticipation of a procedure by vascular surgery on 09/15/2017. 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  --Holding Eliquis as above --HIT antibody 0.182 --Enoxaparin for VTE PPX starting 09/16/2017 1000 hours  Type 2 DM No recent A1c on file but 7.1% on admission. On lantus 40 mg qAM 30 mg qPM at home. --Decreasing Lanuts due to impending NPO status for am treatment  --Continue Lantus at 30U BID following procedure, will increase if his PO intake improves --CBG with meals and QHS, No SSI unless his Lantus is inadequate --BMP daily  CAD: S/P LHC in 04/2017 with drug eluting  stents placed. Will need to remain on Plavix. Denied ongoing chest pain.  --Continued plavix 75mg  daily --PT-INR completed on admission, 17.3/1.43  HTN: BP 136/83. We will continue his home Amlodipine 10mg  daily, and Lisinopril 5mg  daily.  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   Kathi Ludwig, MD 09/15/2017, 7:08 AM Pager: Pager# [phone]

## 2017-09-15 NOTE — Progress Notes (Signed)
Internal Medicine Attending:   I saw and examined the patient. I reviewed the resident's note and I agree with the resident's findings and plan as documented in the resident's note.  Hospital day #3 here with ischemic gangrene if bilateral great toes causing MRSA bacteremia. He is clinically stable with minimal pain, doing pretty well, a little weak, has not got out of bed this admission. Greatly appreciate Dr. Oneida Alar using angiogram today to help plan amputation surgery options. Greatly appreciate Dr. Storm Frisk recommendations about antibiotic treatment of bacteremia. Will plan to continue with dapto and zosyn through the time of surgeries, and then the length of MRSA coverage will be determined by our endocarditis workup.

## 2017-09-15 NOTE — Op Note (Signed)
Patient name: Mike Wright MRN: [id number] DOB: 12/04/1933 Sex: male  09/15/2017 Pre-operative Diagnosis: Bilateral Lower extremity ulcers Post-operative diagnosis:  Same Surgeon:  [name] Procedure Performed:  1.  Ultrasound-guided access, right femoral artery  2.  Abdominal aortogram  3.  Bilateral lower extremity runoff  4.  Angioplasty, left posterior tibial artery  5.  Angioplasty, left peroneal artery  6.  Conscious sedation (91 minutes)  7.  Closure device (Mynx)   Indications: This is an 84 year old diabetic gentleman with bilateral wounds on the great toe.  He comes in today for arterial evaluation and possible intervention.  Procedure:  The patient was identified in the holding area and taken to room 8.  The patient was then placed supine on the table and prepped and draped in the usual sterile fashion.  A time out was called.  Ultrasound was used to evaluate the right common femoral artery.  It was patent .  A digital ultrasound image was acquired.  A micropuncture needle was used to access the right common femoral artery under ultrasound guidance.  An 018 wire was advanced without resistance and a micropuncture sheath was placed.  The 018 wire was removed and a benson wire was placed.  The micropuncture sheath was exchanged for a 5 french sheath.  An omniflush catheter was advanced over the wire to the level of L-1.  An abdominal angiogram was obtained.  Next, using the omniflush catheter and a benson wire, the aortic bifurcation was crossed and the catheter was placed into theleft external iliac artery and left runoff was obtained.  right runoff was performed via retrograde sheath injections.  Findings:   Aortogram: No significant renal artery stenosis.  The infrarenal abdominal aorta is widely patent without stenosis.  Bilateral common and external iliac arteries are widely patent.  Right Lower Extremity: Limited evaluation of the right leg shows the dominant runoff  vessel to be the peroneal artery with diffuse disease throughout the posterior tibial artery  Left Lower Extremity: The left common femoral profundofemoral and superficial femoral artery are calcified but patent without stenosis.  The anterior tibial artery is occluded.  There is a short segment occlusion of the peroneal artery.  Posterior tibial artery has multiple areas of greater than 90% stenosis.  It is the dominant vessel out onto the foot.  Intervention: After the above images were acquired the decision was made to proceed with intervention.  A 6 x 90 sheath was advanced over the bifurcation into the left popliteal artery.  The patient has a heparin allergy and therefore an Angiomax bolus and continuous infusion were administered.  The ACT was confirmed to be greater than 250.  I selected a V-18 wire and advanced this through the sheath.  It easily cross the stenosis and the peroneal artery, and therefore I elected to perform balloon angioplasty of the peroneal artery occlusion with a 3 x 60 Sterling balloon.  The balloon was taken to nominal pressure for 2 minutes.  Completion imaging revealed resolution of the occlusion with no residual stenosis in the peroneal artery.  I then used a angled quick cross catheter and a V 18 wire to traverse the stenoses within the posterior tibial artery and get a wire out onto the foot.  I then selected a 2 x 220 Sterling balloon to perform balloon angioplasty of the posterior tibial artery down below the calcaneus.  The balloon was taken to nominal pressure for 2 minutes.  This balloon was removed and then  I inserted a 3 x 220 Sterling balloon and performed balloon angioplasty of the posterior tibial artery from the joint space at the ankle up to its origin.  This was done in 2 separate inflations taking the balloon to nominal pressure holding up for 2 minutes with each inflation.  Completion imaging revealed in-line flow in the posterior tibial artery without residual  stenosis.  The peroneal artery was also now patent down to the ankle.  I was satisfied with these results and therefore elected to terminate the procedure.  Catheters and wires were removed.  The long 6 French sheath was exchanged out for a short 6 [place] sheath and a Mynx device was used for closure with good success.  There is a small hematoma after closure.  Impression:  #1  Short segment occlusion of the peroneal artery successfully crossed and treated with a 3 mm balloon with no residual stenosis.  #2  Multiple high-grade, greater than 90% areas of stenosis throughout the posterior tibial artery across the ankle.  I treated the posterior tibial artery out across the ankle with a 2 mm balloon and the remaining portion of the posterior tibial artery with a 3 mm balloon with no residual stenosis.  #3  Tibial disease on the right.  The patient will be brought back in approximately 1 week for intervention of the right posterior tibial artery     V. [name], M.D. [facility] Office: [phone] Pager:  [phone]

## 2017-09-15 NOTE — Progress Notes (Signed)
  Echocardiogram 2D Echocardiogram has been performed.  Mike Wright 09/15/2017, 10:14 AM

## 2017-09-16 DIAGNOSIS — J810 Acute pulmonary edema: Secondary | ICD-10-CM

## 2017-09-16 DIAGNOSIS — J449 Chronic obstructive pulmonary disease, unspecified: Secondary | ICD-10-CM

## 2017-09-16 DIAGNOSIS — Z960 Presence of urogenital implants: Secondary | ICD-10-CM

## 2017-09-16 DIAGNOSIS — R339 Retention of urine, unspecified: Secondary | ICD-10-CM | POA: Diagnosis not present

## 2017-09-16 DIAGNOSIS — Z9079 Acquired absence of other genital organ(s): Secondary | ICD-10-CM

## 2017-09-16 LAB — BASIC METABOLIC PANEL
Anion gap: 10 (ref 5–15)
BUN: 9 mg/dL (ref 6–20)
CALCIUM: 8.4 mg/dL — AB (ref 8.9–10.3)
CO2: 25 mmol/L (ref 22–32)
CREATININE: 0.79 mg/dL (ref 0.61–1.24)
Chloride: 103 mmol/L (ref 101–111)
GFR calc Af Amer: >60 mL/min (ref >60)
Glucose, Bld: 167 mg/dL — ABNORMAL HIGH (ref 65–99)
Potassium: 3.8 mmol/L (ref 3.5–5.1)
Sodium: 138 mmol/L (ref 135–145)

## 2017-09-16 LAB — CBC
HCT: 30 % — ABNORMAL LOW (ref 39.0–52.0)
Hemoglobin: 9.3 g/dL — ABNORMAL LOW (ref 13.0–17.0)
MCH: 25.3 pg — AB (ref 26.0–34.0)
MCHC: 31 g/dL (ref 30.0–36.0)
MCV: 81.5 fL (ref 78.0–100.0)
PLATELETS: 318 10*3/uL (ref 150–400)
RBC: 3.68 MIL/uL — AB (ref 4.22–5.81)
RDW: 16.7 % — ABNORMAL HIGH (ref 11.5–15.5)
WBC: 15.5 10*3/uL — ABNORMAL HIGH (ref 4.0–10.5)

## 2017-09-16 LAB — GLUCOSE, CAPILLARY
GLUCOSE-CAPILLARY: 125 mg/dL — AB (ref 65–99)
GLUCOSE-CAPILLARY: 282 mg/dL — AB (ref 65–99)
Glucose-Capillary: 192 mg/dL — ABNORMAL HIGH (ref 65–99)

## 2017-09-16 LAB — CULTURE, BLOOD (ROUTINE X 2): SPECIAL REQUESTS: ADEQUATE

## 2017-09-16 MED ORDER — FUROSEMIDE 20 MG PO TABS: 20.0000 mg | ORAL_TABLET | Freq: Every day | ORAL | Status: DC

## 2017-09-16 MED ORDER — APIXABAN 5 MG PO TABS
5.0000 mg | ORAL_TABLET | Freq: Two times a day (BID) | ORAL | Status: DC
Start: 2017-09-16 — End: 2017-09-18
  Administered 2017-09-16 – 2017-09-18 (×5): 5 mg via ORAL
  Filled 2017-09-16 (×5): qty 1

## 2017-09-16 MED ORDER — INSULIN GLARGINE 100 UNIT/ML ~~LOC~~ SOLN
18.0000 [IU] | Freq: Two times a day (BID) | SUBCUTANEOUS | Status: DC
  Administered 2017-09-16 – 2017-09-17 (×3): 18 [IU] via SUBCUTANEOUS
  Filled 2017-09-16 (×4): qty 0.18

## 2017-09-16 MED ORDER — FUROSEMIDE 10 MG/ML IJ SOLN
20.0000 mg | Freq: Once | INTRAMUSCULAR | Status: AC
  Administered 2017-09-16: 20 mg via INTRAVENOUS
  Filled 2017-09-16: qty 2

## 2017-09-16 NOTE — Progress Notes (Signed)
Internal Medicine Attending:   I saw and examined the patient. I reviewed the resident's note and I agree with the resident's findings and plan as documented in the resident's note.  Hospital day #4 and POD1 after left angioplasty which seems to have gone well. He did have shortness of breath last night, he has crackles this morning bilaterally, so will increase diuresis today and monitor carefully. We are treating MRSA bacteremia as well with daptomycin, repeat blood cultures pending, TEE pending.

## 2017-09-16 NOTE — Progress Notes (Addendum)
Subjective: Developed some shortness of breath overnight and required supplemental oxygen. He has not had a cough or chills. Had difficulties with urinary retention and had a foley placed. Mrs. Mike Wright is at bedside, they were updated on the plans and asked questions about when to expect discharge.   Objective:  Vital signs in last 24 hours: Vitals:   09/16/17 0029 09/16/17 0035 09/16/17 0617 09/16/17 1032  BP:    129/82  Pulse: 100 100 100 95  Resp: (!) 26 (!) 32 (!) 24   Temp:   97.9 F (36.6 C)   TempSrc:   Oral   SpO2: (!) 89% 92% 95%   Weight:   206 lb 9.1 oz (93.7 kg)   Height:       General: well appearing, no acute distress  Cardiac: irregular rhythm, normal rate, 3/6 early systolic murmur, no peripheral edema, no calf tenderness Pulm: diffuse course rhonchi, appreciated on the right lung fields greater than left, normal work of breathing, speaking in full sentences, on nasal canula at 2 liters  GI: the abdomen is soft, non tender, non distended Ext: surgical site in the right groin and left ankle ar minimally tender with dressing clean and dry and without signs of hematoma  Ultrasound exam: He does not appear to have reduced ejection fraction, there are > 5 B lines per field over the left lung fields, about 2 per field over the right lung fields, no pleural effusions, Prostate difficult to examine, the bladder is not distended  Assessment/Plan:  Active Problems:   Diabetes (HCC)   Essential hypertension   Paroxysmal supraventricular tachycardia (HCC)   CAD (coronary artery disease)   Chronic diastolic CHF (congestive heart failure) (HCC)   COPD (chronic obstructive pulmonary disease) ([place])   Status post coronary artery stent placement   Gangrene (HCC)   MRSA bacteremia   Urinary retention   Acute pulmonary edema (HCC)  Limb ischemia  Complicated by gangrene of b/l first great toes  Angioplasty of the left extremity was performed yesterday. The procedure was  noted to have gone without complication.  - started on daily aspirin 81 mg  - continue home atorvastatin 80 mg - scheduled for right leg angioplasty 7/2  - continue zosyn 6/21 >>   MRSA bacteremia  - MRSA noted in 2/4 blood culture bottles drawn 6/19 - TTE without evidence of vegetation  - TEE scheduled for 6/24  - repeat blood cultures drawn 6/21 - in process  - on Daptomycin 6/20 >>  Acute pulmonary edema  - on PRN lasix at home, last echo was 08/2017 with moderate LVH and EF 55-60%  - developed shortness of breath associated with hypoxemia overnight and was placed on 2 L [place], now he is satting well  - physical and bedside POC ultrasound exam today consistent with pulmonary edema - give one dose of IV lasix 20 mg today  - continue daily wt and intake and output monitoring   Urinary obstruction  - developed post angioplasty yesterday  - notes that he is s/p transurethral prosthetic resection  - foley catheter placed 6/21   P-A-fib: - in Afib this morning, rate is controlled around 90  - continue home metoprolol 100 mg qd for rate control  - resume eliquis for stroke ppx today ( this will also serve as DVT ppx)   Type 2 DM A1c 7.1% on admission. On lantus 40 mg qAM 30 mg qPM at home. --currently he is at lantus 15 units BID  and blood glucose has ranged 130-180 over the past 24 hours  - will increase to 17 units BID as was NPO yesterday and likely be eating more today  --CBG with meals and QHS, No SSI for now --BMP daily  CAD: S/P LHC in 04/2017 with drug eluting stents placed. Will need to remain on Plavix.   --Continued plavix 75mg  daily, aspirin, metoprolol, lisinopril, and statin  - PRN nitro however please call if notified of chest pain   HTN: BP 129/32 this morning. We will continue his home Amlodipine 10mg  daily, and Lisinopril 5mg  daily.  COPD  No signs of exacerbation on exam today  - continue daily spiriva  - albuterol PRN   Dispo: Anticipated discharge  in approximately 2-5 day(s).   Ledell Noss, MD 09/16/2017, 11:09 AM Pager: [phone]

## 2017-09-16 NOTE — Evaluation (Signed)
Occupational Therapy Evaluation Patient Details Name: Mike Wright MRN: [id number] DOB: [birth year] Today's Date: 09/16/2017    History of Present Illness Pt is an 84 y/o male admitted secondary to bilat great toe ischemic gangrene. Per RN for angiogram possibly on 6/21, and will likely have great toe amputation. PMH includes DM, HTN, CAD, dCHF, COPD, and stent placement.    Clinical Impression   PTA, pt was living with his wife and was independent with ADLs and using [facility] for functional mobility. Currently pt requiring Min A for grooming in standing, Min A for LB ADLs, and Min Guard A for functional mobility with RW. Pt presenting with increased pain and decreased strength, balance, and activity tolerance. Pt would benefit from further acute OT to facilitate safe dc and return to PLOF. Pt planning for surgery and will update dc recommendation after procedures.     Follow Up Recommendations  Other (comment)(Pending progress post surgery)    Equipment Recommendations  None recommended by OT    Recommendations for Other Services PT consult     Precautions / Restrictions Precautions Precautions: Fall Restrictions Weight Bearing Restrictions: No      Mobility Bed Mobility Overal bed mobility: Modified Independent                Transfers Overall transfer level: Needs assistance Equipment used: Straight cane Transfers: Sit to/from Stand Sit to Stand: Min assist         General transfer comment: Min A for steadying assist to stand. Increased time required.     Balance Overall balance assessment: Needs assistance Sitting-balance support: No upper extremity supported;Feet supported Sitting balance-Leahy Scale: Fair     Standing balance support: Single extremity supported;During functional activity Standing balance-Leahy Scale: Poor Standing balance comment: Reliant on UE support                            ADL either performed or assessed with clinical  judgement   ADL Overall ADL's : Needs assistance/impaired Eating/Feeding: Set up;Sitting   Grooming: Minimal assistance;Oral care;Standing Grooming Details (indicate cue type and reason): Min A for standing balance duirng oral care at sink.  Upper Body Bathing: Set up;Supervision/ safety;Sitting   Lower Body Bathing: Minimal assistance;Sit to/from stand   Upper Body Dressing : Set up;Supervision/safety;Sitting   Lower Body Dressing: Minimal assistance;Sit to/from stand Lower Body Dressing Details (indicate cue type and reason): Pt able to don left side by bringing ankles to knee. Requiring assistance to don right sock. Requiring Min A for standing balance Toilet Transfer: Minimal assistance;Ambulation;RW(Simulated to recliner)           Functional mobility during ADLs: Min guard;Rolling walker General ADL Comments: Pt demonstrating decreased funcitonal performance due to decreased strength, balance, and activity tolerance. Also limited by pain. Pt reporitng SOB with activity. SpO2 dropping to 87% on RA with activity. Placing pt back on O2     Vision         Perception     Praxis      Pertinent Vitals/Pain Pain Assessment: Faces Faces Pain Scale: Hurts whole lot Pain Location: bilat great toes  Pain Descriptors / Indicators: Sharp;Grimacing Pain Intervention(s): Monitored during session;Limited activity within patient's tolerance;Repositioned     Hand Dominance     Extremity/Trunk Assessment Upper Extremity Assessment Upper Extremity Assessment: Overall WFL for tasks assessed   Lower Extremity Assessment Lower Extremity Assessment: Defer to PT evaluation RLE Deficits / Details: R great toe gangrene  noted.  LLE Deficits / Details: L great toe gangrene noted.    Cervical / Trunk Assessment Cervical / Trunk Assessment: Normal   Communication Communication Communication: No difficulties   Cognition Arousal/Alertness: Awake/alert Behavior During Therapy: WFL for  tasks assessed/performed Overall Cognitive Status: Within Functional Limits for tasks assessed                                     General Comments  Wife present throughout session    Exercises     Shoulder Instructions      Home Living Family/patient expects to be discharged to:: Private residence Living Arrangements: Spouse/significant other Available Help at Discharge: Family;Available 24 hours/day Type of Home: House Home Access: Stairs to enter [employer] of Steps: 2 Entrance Stairs-Rails: Left Home Layout: Two level;Able to live on main level with bedroom/bathroom     Bathroom Shower/Tub: Teacher, early years/pre: Standard     Home Equipment: Environmental consultant - 2 wheels;Cane - single point;Bedside commode;Shower seat;Grab bars - toilet          Prior Functioning/Environment Level of Independence: Independent with assistive device(s)        Comments: Independent with ADLs. Had been using cane over the past week secondary to increased pain. Enjoys going to church, out to eat, and shopping with his wife.        OT Problem List: Decreased strength;Decreased range of motion;Decreased activity tolerance;Impaired balance (sitting and/or standing);Decreased safety awareness;Decreased knowledge of use of DME or AE;Decreased knowledge of precautions;Pain      OT Treatment/Interventions: Self-care/ADL training;Therapeutic exercise;Energy conservation;DME and/or AE instruction;Therapeutic activities;Patient/family education    OT Goals(Current goals can be found in the care plan section) Acute Rehab OT Goals Patient Stated Goal: to go home  OT Goal Formulation: With patient Time For Goal Achievement: 09/30/17 Potential to Achieve Goals: Good ADL Goals Pt Will Perform Grooming: with modified independence;standing Pt Will Perform Lower Body Dressing: with modified independence;with adaptive equipment;sit to/from stand Pt Will Transfer to  Toilet: with modified independence;ambulating;bedside commode Pt Will Perform Toileting - Clothing Manipulation and hygiene: with modified independence;sit to/from stand Pt Will Perform Tub/Shower Transfer: Tub transfer;ambulating;shower seat;with supervision;rolling walker  OT Frequency: Min 2X/week   Barriers to D/C:            Co-evaluation              AM-PAC PT "6 Clicks" Daily Activity     Outcome Measure Help from another person eating meals?: None Help from another person taking care of personal grooming?: A Little Help from another person toileting, which includes using toliet, bedpan, or urinal?: A Little Help from another person bathing (including washing, rinsing, drying)?: A Little Help from another person to put on and taking off regular upper body clothing?: A Little Help from another person to put on and taking off regular lower body clothing?: A Little 6 Click Score: 19   End of Session Equipment Utilized During Treatment: Gait belt;Rolling walker;Oxygen Nurse Communication: Mobility status;Precautions(SpO2)  Activity Tolerance: Patient tolerated treatment well Patient left: in chair;with call bell/phone within reach  OT Visit Diagnosis: Unsteadiness on feet (R26.81);Other abnormalities of gait and mobility (R26.89);Muscle weakness (generalized) (M62.81);Pain Pain - Right/Left: (Bilateral) Pain - part of body: Ankle and joints of foot(Toes)                Time: 5361-4431 OT Time Calculation (min): 27 min Charges:  OT General Charges $OT Visit: 1 Visit OT Evaluation $OT Eval Moderate Complexity: 1 Mod OT Treatments $Self Care/Home Management : 8-22 mins G-Codes:     Marquerite Forsman MSOT, OTR/L Acute Rehab Pager: [phone] Office: [place] 09/16/2017, 10:18 AM

## 2017-09-16 NOTE — Progress Notes (Addendum)
Progress Note    09/16/2017 8:09 AM 1 Day Post-Op  Subjective:  No complaints.  Tm 99.7 now afebrile   Vitals:   09/16/17 0035 09/16/17 0617  BP:    Pulse: 100 100  Resp: (!) 32 (!) 24  Temp:  97.9 F (36.6 C)  SpO2: 92% 95%    Physical Exam: Cardiac:  regular Lungs:  Non labored Incisions:  Right groin is soft with mild ecchymosis; no hematoma Extremities:  Brisk left peroneal and PT doppler signals   CBC    Component Value Date/Time   WBC 15.5 (H) 09/16/2017 0709   RBC 3.68 (L) 09/16/2017 0709   HGB 9.3 (L) 09/16/2017 0709   HGB 12.3 (L) 05/30/2017 1531   HCT 30.0 (L) 09/16/2017 0709   HCT 37.7 05/30/2017 1531   PLT 318 09/16/2017 0709   PLT 212 05/30/2017 1531   MCV 81.5 09/16/2017 0709   MCV 86 05/30/2017 1531   MCH 25.3 (L) 09/16/2017 0709   MCHC 31.0 09/16/2017 0709   RDW 16.7 (H) 09/16/2017 0709   RDW 16.0 (H) 05/30/2017 1531   LYMPHSABS 1.1 09/13/2017 1308   MONOABS 2.9 (H) 09/13/2017 1308   EOSABS 0.0 09/13/2017 1308   BASOSABS 0.1 09/13/2017 1308    BMET    Component Value Date/Time   NA 135 09/15/2017 0638   NA 139 05/30/2017 1531   K 3.8 09/15/2017 0638   CL 101 09/15/2017 0638   CO2 25 09/15/2017 0638   GLUCOSE 149 (H) 09/15/2017 0638   BUN 10 09/15/2017 0638   BUN 13 05/30/2017 1531   CREATININE 0.81 09/15/2017 0638   CALCIUM 8.1 (L) 09/15/2017 0638   GFRNONAA >60 09/15/2017 0638   GFRAA >60 09/15/2017 0638    INR    Component Value Date/Time   INR 1.32 09/15/2017 3016     Intake/Output Summary (Last 24 hours) at 09/16/2017 0809 Last data filed at 09/16/2017 0645 Gross per 24 hour  Intake 1271.59 ml  Output 1225 ml  Net 46.59 ml     Assessment:  84 y.o. male is s/p:              1.  Ultrasound-guided access, right femoral artery             2.  Abdominal aortogram             3.  Bilateral lower extremity runoff             4.  Angioplasty, left posterior tibial artery             5.  Angioplasty, left peroneal  artery             6.  Conscious sedation (91 minutes)             7.  Closure device (Mynx)  1 Day Post-Op   Plan: -pt doing well this am-he has brisk doppler signals left peroneal and PT.   -right groin is soft with some ecchymosis -per Dr. Trula Slade will give him some time to recover from the procedure and bring him back on July 2nd to intervene on the right leg.  Eliquis can be restarted today as long as no urgent intervention for his toes are needed prior to intervention.    Leontine Locket, PA-C [facility] [phone] 09/16/2017 8:09 AM  I have examined the patient, reviewed and agree with above.  Groin puncture site stable.  Discussed plan for intervention on right leg in several  weeks.  Curt Jews, MD 09/16/2017 3:26 PM

## 2017-09-17 LAB — CBC
HEMATOCRIT: 28.8 % — AB (ref 39.0–52.0)
Hemoglobin: 8.9 g/dL — ABNORMAL LOW (ref 13.0–17.0)
MCH: 25.1 pg — ABNORMAL LOW (ref 26.0–34.0)
MCHC: 30.9 g/dL (ref 30.0–36.0)
MCV: 81.1 fL (ref 78.0–100.0)
Platelets: 282 10*3/uL (ref 150–400)
RBC: 3.55 MIL/uL — ABNORMAL LOW (ref 4.22–5.81)
RDW: 16.6 % — AB (ref 11.5–15.5)
WBC: 11.5 10*3/uL — ABNORMAL HIGH (ref 4.0–10.5)

## 2017-09-17 LAB — GLUCOSE, CAPILLARY
Glucose-Capillary: 152 mg/dL — ABNORMAL HIGH (ref 65–99)
Glucose-Capillary: 232 mg/dL — ABNORMAL HIGH (ref 65–99)
Glucose-Capillary: 270 mg/dL — ABNORMAL HIGH (ref 65–99)
Glucose-Capillary: 349 mg/dL — ABNORMAL HIGH (ref 65–99)

## 2017-09-17 LAB — BASIC METABOLIC PANEL
ANION GAP: 8 (ref 5–15)
BUN: 11 mg/dL (ref 6–20)
CALCIUM: 8 mg/dL — AB (ref 8.9–10.3)
CO2: 25 mmol/L (ref 22–32)
CREATININE: 0.84 mg/dL (ref 0.61–1.24)
Chloride: 103 mmol/L (ref 101–111)
GFR calc Af Amer: >60 mL/min (ref >60)
GFR calc non Af Amer: >60 mL/min (ref >60)
Glucose, Bld: 254 mg/dL — ABNORMAL HIGH (ref 65–99)
Potassium: 3.9 mmol/L (ref 3.5–5.1)
Sodium: 136 mmol/L (ref 135–145)

## 2017-09-17 MED ORDER — SODIUM CHLORIDE 0.9 % IV SOLN
INTRAVENOUS | Status: DC
  Administered 2017-09-18: 11:00:00 via INTRAVENOUS

## 2017-09-17 MED ORDER — PSYLLIUM 95 % PO PACK
1.0000 | PACK | Freq: Every day | ORAL | Status: DC
  Administered 2017-09-17 – 2017-09-25 (×8): 1 via ORAL
  Filled 2017-09-17 (×8): qty 1

## 2017-09-17 MED ORDER — FUROSEMIDE 20 MG PO TABS
20.0000 mg | ORAL_TABLET | Freq: Once | ORAL | Status: AC
  Administered 2017-09-17: 20 mg via ORAL
  Filled 2017-09-17: qty 1

## 2017-09-17 NOTE — Progress Notes (Addendum)
Subjective: The patient was resting in his bed today preparing for breakfast upon entering the room. He stated that he slept well overnight, denied pain, fever, nausea, or vomiting. He inquired as to his disposition and reluctantly agreed that the evaluation and treatment should be completed prior to discharge.   Objective:  Vital signs in last 24 hours: Vitals:   09/16/17 1111 09/16/17 2003 09/17/17 0343 09/17/17 0418  BP: (!) 145/80 (!) 144/71 (!) 144/73 127/77  Pulse: 95 78  79  Resp: (!) 21 (!) 28  15  Temp: (!) 97.5 F (36.4 C) 98.6 F (37 C) 97.8 F (36.6 C) 97.6 F (36.4 C)  TempSrc: Oral Oral Oral Oral  SpO2: 98% 96%  96%  Weight:    206 lb 8 oz (93.7 kg)  Height:       Physical Exam  Constitutional: He is oriented to person, place, and time. No distress.  Cardiovascular: Normal rate and regular rhythm.  Murmur heard.  Systolic murmur is present. Pulmonary/Chest: Effort normal and breath sounds normal. No respiratory distress.  Abdominal: Soft. Bowel sounds are normal. He exhibits no distension.  Musculoskeletal: He exhibits edema (Bilateral distal lower extremities) and deformity (Bilateral gangrenous great toes). He exhibits no tenderness.  Neurological: He is alert and oriented to person, place, and time.  Skin: Skin is warm. He is not diaphoretic. There is erythema (Of the left greater than the right foot).  Psychiatric: He has a normal mood and affect.  Vitals reviewed.  Assessment/Plan:  Active Problems:   Diabetes ([place])   Essential hypertension   Paroxysmal supraventricular tachycardia (HCC)   CAD (coronary artery disease)   Chronic diastolic CHF (congestive heart failure) (HCC)   COPD (chronic obstructive pulmonary disease) (HCC)   Status post coronary artery stent placement   Gangrene (HCC)   MRSA bacteremia   Urinary retention   Acute pulmonary edema ([place])  Assessment:  Mike Wright is an 84yo Mwith PMHx notable forCAD ( s/p stenting x2 to LAD  and x1 to diag in February 2019), atrial fibrillation with paroxysmal supraventricular tachycardia, type 2 diabetes ( on insulin) who presents for evaluation of pain and blackening of his large toes on both feet. The presentation was concerning for severe distal PAD with secondary dry gangrene with concern for secondary infection given that the skin barrier was broken multiple times in a non-sterile environment. Hewasadmitted for evaluation and inpatient intervention.   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 Their plan is to have the patient return in one week for intervention of the right posterior tibial artery and possibly peroneal arteries.  --Return on July 2nd for right sided angiogram --Continue Zosyn as per ID for gram negative and anaerobic coverage  MRSA: Observed on Culture routinely taken at admission. --TTE w/o vegetation commented on --TEE ordered, as per cards this is planned for 06/24 --Daptomycin 8mg /kg x 96.8kg 06/20 >> --Repeat Cx's with G+ cocci in anaerobic bottle only --Daily CBC's  Pulmonary Edema: Most likely secondary to CHF although most recent Echo with an LVEF of 55-60% and no prominent diastolic dysfunction. Given one time does of IV lasix due to physical exam findings including POCUS exam indicating pulmonary edema. ON Lasix PRN at home for dyspnea.  --Continue strict I/O's and daily weights --Repeat Lasix PRN for edema/dyspnea  P-A-fib: On Eliquis at home. --Eliquis 5mg  BID --HIT antibody 0.182  Type 2 DM No recent A1c on file but 7.1% on admission. On lantus 40 mg qAM 30 mg qPM at home. --Decreasing Lanuts due to impending NPO status for am treatment  --Lantus continued at 18U as patient is not consuming large portions of his meals --CBG with meals and QHS, No SSI unless his  Lantus is inadequate --BMP daily  CAD: S/P LHC in 04/2017 with drug eluting stents placed. Will need to remain on Plavix. Denied ongoing chest pain.  --Continued plavix 75mg  daily --Continue home atorvastatin  --Continue daily ASA 81mg   COPD: No acute issues. --Continue Spiriva --Continue albuterol PRN  HTN: BP 127/77 this am but markedly elevated prior. We will continue his home Amlodipine 10mg  daily, and Lisinopril 5mg  daily and make the recommendation that they consider increasing the lisinopril at his outpatient visits.   Diet:Heart Healthy Code: Full Fluids: n/a GI PPX: Home Protonix 40mg  daily VTE PPX: Eliquis  Dispo: Anticipated discharge in approximately 2 day(s).   Kathi Ludwig, MD 09/17/2017, 6:44 AM Pager: Pager# [phone]

## 2017-09-17 NOTE — Progress Notes (Signed)
Pharmacy Antibiotic Note  Mike Wright is a 84 y.o. male admitted on 09/13/2017 with pain and swelling in toes.  Found to have gangrene/cellulitis and possible ischemic limb.  Imaging negative for osteomyelitis. Pt also with MRSA bacteremia.   CTX 6/19 >> 6/20 Dapto 6/20 >> Zosyn 6/21 >>  6/19 BCx - 2/4 GPC (BCID MRSA) 6/21 BCx - 1/4 GPC  TEE planned next week  CK stable at 53  Plan: -Daptomycin 8 mg/kg IV q24h -Zosyn 3.375 g IV q8h -Monitor renal fx, cultures -CK once/week  -DC Lipitor for now while on Daptomycin   Height: 5\' 7"  (170.2 cm) Weight: 206 lb 8 oz (93.7 kg) IBW/kg (Calculated) : 66.1  Temp (24hrs), Avg:98 F (36.7 C), Min:97.6 F (36.4 C), Max:98.6 F (37 C)  Recent Labs  Lab 09/13/17 1308 09/13/17 1344 09/14/17 0501 09/15/17 0638 09/16/17 0709 09/17/17 0913  WBC 19.0*  --  15.4* 16.7* 15.5* 11.5*  CREATININE 1.03  --  0.92 0.81 0.79 0.84  LATICACIDVEN  --  2.42*  --   --   --   --       Allergies  Allergen Reactions  . Codeine Shortness Of Breath  . Heparin Other (See Comments)    +HIT,  Severe bleeding   . Losartan Swelling    Per ENT  . Oxycodone Other (See Comments)    "Made me act out of my mind"  . Other Other (See Comments)    Severe bleeding     Thank you Anette Guarneri, PharmD [phone] 09/17/2017 11:41 AM

## 2017-09-17 NOTE — H&P (View-Only) (Signed)
Subjective: The patient was resting in his bed today preparing for breakfast upon entering the room. He stated that he slept well overnight, denied pain, fever, nausea, or vomiting. He inquired as to his disposition and reluctantly agreed that the evaluation and treatment should be completed prior to discharge.   Objective:  Vital signs in last 24 hours: Vitals:   09/16/17 1111 09/16/17 2003 09/17/17 0343 09/17/17 0418  BP: (!) 145/80 (!) 144/71 (!) 144/73 127/77  Pulse: 95 78  79  Resp: (!) 21 (!) 28  15  Temp: (!) 97.5 F (36.4 C) 98.6 F (37 C) 97.8 F (36.6 C) 97.6 F (36.4 C)  TempSrc: Oral Oral Oral Oral  SpO2: 98% 96%  96%  Weight:    206 lb 8 oz (93.7 kg)  Height:       Physical Exam  Constitutional: He is oriented to person, place, and time. No distress.  Cardiovascular: Normal rate and regular rhythm.  Murmur heard.  Systolic murmur is present. Pulmonary/Chest: Effort normal and breath sounds normal. No respiratory distress.  Abdominal: Soft. Bowel sounds are normal. He exhibits no distension.  Musculoskeletal: He exhibits edema (Bilateral distal lower extremities) and deformity (Bilateral gangrenous great toes). He exhibits no tenderness.  Neurological: He is alert and oriented to person, place, and time.  Skin: Skin is warm. He is not diaphoretic. There is erythema (Of the left greater than the right foot).  Psychiatric: He has a normal mood and affect.  Vitals reviewed.  Assessment/Plan:  Active Problems:   Diabetes ([place])   Essential hypertension   Paroxysmal supraventricular tachycardia (HCC)   CAD (coronary artery disease)   Chronic diastolic CHF (congestive heart failure) (HCC)   COPD (chronic obstructive pulmonary disease) (HCC)   Status post coronary artery stent placement   Gangrene (HCC)   MRSA bacteremia   Urinary retention   Acute pulmonary edema ([place])  Assessment:  Mr. Brod is an 84yo Mwith PMHx notable forCAD ( s/p stenting x2 to LAD  and x1 to diag in February 2019), atrial fibrillation with paroxysmal supraventricular tachycardia, type 2 diabetes ( on insulin) who presents for evaluation of pain and blackening of his large toes on both feet. The presentation was concerning for severe distal PAD with secondary dry gangrene with concern for secondary infection given that the skin barrier was broken multiple times in a non-sterile environment. Hewasadmitted for evaluation and inpatient intervention.   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 Their plan is to have the patient return in one week for intervention of the right posterior tibial artery and possibly peroneal arteries.  --Return on July 2nd for right sided angiogram --Continue Zosyn as per ID for gram negative and anaerobic coverage  MRSA: Observed on Culture routinely taken at admission. --TTE w/o vegetation commented on --TEE ordered, as per cards this is planned for 06/24 --Daptomycin 8mg /kg x 96.8kg 06/20 >> --Repeat Cx's with G+ cocci in anaerobic bottle only --Daily CBC's  Pulmonary Edema: Most likely secondary to CHF although most recent Echo with an LVEF of 55-60% and no prominent diastolic dysfunction. Given one time does of IV lasix due to physical exam findings including POCUS exam indicating pulmonary edema. ON Lasix PRN at home for dyspnea.  --Continue strict I/O's and daily weights --Repeat Lasix PRN for edema/dyspnea  P-A-fib: On Eliquis at home. --Eliquis 5mg  BID --HIT antibody 0.182  Type 2 DM No recent A1c on file but 7.1% on admission. On lantus 40 mg qAM 30 mg qPM at home. --Decreasing Lanuts due to impending NPO status for am treatment  --Lantus continued at 18U as patient is not consuming large portions of his meals --CBG with meals and QHS, No SSI unless his  Lantus is inadequate --BMP daily  CAD: S/P LHC in 04/2017 with drug eluting stents placed. Will need to remain on Plavix. Denied ongoing chest pain.  --Continued plavix 75mg  daily --Continue home atorvastatin  --Continue daily ASA 81mg   COPD: No acute issues. --Continue Spiriva --Continue albuterol PRN  HTN: BP 127/77 this am but markedly elevated prior. We will continue his home Amlodipine 10mg  daily, and Lisinopril 5mg  daily and make the recommendation that they consider increasing the lisinopril at his outpatient visits.   Diet:Heart Healthy Code: Full Fluids: n/a GI PPX: Home Protonix 40mg  daily VTE PPX: Eliquis  Dispo: Anticipated discharge in approximately 2 day(s).   Kathi Ludwig, MD 09/17/2017, 6:44 AM Pager: Pager# [phone]

## 2017-09-18 ENCOUNTER — Inpatient Hospital Stay (HOSPITAL_COMMUNITY): Payer: Medicare HMO | Admitting: Anesthesiology

## 2017-09-18 ENCOUNTER — Encounter (HOSPITAL_COMMUNITY)
Admission: EM | Disposition: A | Discharge status: Skilled Nursing Facility | Payer: Self-pay | Source: Home / Self Care | Attending: Student in an Organized Health Care Education/Training Program

## 2017-09-18 ENCOUNTER — Inpatient Hospital Stay (HOSPITAL_COMMUNITY): Payer: Medicare HMO

## 2017-09-18 ENCOUNTER — Other Ambulatory Visit: Payer: Self-pay | Admitting: *Deleted

## 2017-09-18 ENCOUNTER — Encounter (HOSPITAL_COMMUNITY): Payer: Self-pay | Admitting: Surgery

## 2017-09-18 DIAGNOSIS — R7881 Bacteremia: Secondary | ICD-10-CM

## 2017-09-18 DIAGNOSIS — I361 Nonrheumatic tricuspid (valve) insufficiency: Secondary | ICD-10-CM

## 2017-09-18 HISTORY — PX: TEE WITHOUT CARDIOVERSION: SHX5443

## 2017-09-18 LAB — BASIC METABOLIC PANEL
Anion gap: 7 (ref 5–15)
BUN: 9 mg/dL (ref 6–20)
CHLORIDE: 102 mmol/L (ref 101–111)
CO2: 26 mmol/L (ref 22–32)
CREATININE: 0.82 mg/dL (ref 0.61–1.24)
Calcium: 8.1 mg/dL — ABNORMAL LOW (ref 8.9–10.3)
GFR calc Af Amer: >60 mL/min (ref >60)
GFR calc non Af Amer: >60 mL/min (ref >60)
Glucose, Bld: 232 mg/dL — ABNORMAL HIGH (ref 65–99)
POTASSIUM: 3.9 mmol/L (ref 3.5–5.1)
SODIUM: 135 mmol/L (ref 135–145)

## 2017-09-18 LAB — GLUCOSE, CAPILLARY
GLUCOSE-CAPILLARY: 159 mg/dL — AB (ref 65–99)
GLUCOSE-CAPILLARY: 130 mg/dL — AB (ref 65–99)
GLUCOSE-CAPILLARY: 206 mg/dL — AB (ref 65–99)
GLUCOSE-CAPILLARY: 182 mg/dL — AB (ref 65–99)

## 2017-09-18 LAB — PROTIME-INR
INR: 1.54
PROTHROMBIN TIME: 18.3 s — AB (ref 11.4–15.2)

## 2017-09-18 LAB — CULTURE, BLOOD (ROUTINE X 2): Culture: NO GROWTH

## 2017-09-18 SURGERY — ECHOCARDIOGRAM, TRANSESOPHAGEAL
Anesthesia: Monitor Anesthesia Care

## 2017-09-18 MED ORDER — PROPOFOL 10 MG/ML IV BOLUS
INTRAVENOUS | Status: DC | PRN
  Administered 2017-09-18 (×3): 20 mg via INTRAVENOUS
  Administered 2017-09-18: 10 mg via INTRAVENOUS

## 2017-09-18 MED ORDER — PROPOFOL 500 MG/50ML IV EMUL
INTRAVENOUS | Status: DC | PRN
  Administered 2017-09-18: 75 ug/kg/min via INTRAVENOUS

## 2017-09-18 MED ORDER — INSULIN GLARGINE 100 UNIT/ML ~~LOC~~ SOLN: 25.0000 [IU] | Freq: Two times a day (BID) | SUBCUTANEOUS | Status: DC

## 2017-09-18 MED ORDER — SODIUM CHLORIDE 0.9 % IV SOLN
775.0000 mg | INTRAVENOUS | Status: DC
  Administered 2017-09-18 – 2017-09-21 (×3): 775 mg via INTRAVENOUS
  Filled 2017-09-18 (×3): qty 15.5

## 2017-09-18 MED ORDER — INSULIN GLARGINE 100 UNIT/ML ~~LOC~~ SOLN
30.0000 [IU] | Freq: Two times a day (BID) | SUBCUTANEOUS | Status: DC
  Administered 2017-09-18 – 2017-09-19 (×3): 30 [IU] via SUBCUTANEOUS
  Filled 2017-09-18 (×3): qty 0.3

## 2017-09-18 MED FILL — Nitroglycerin IV Soln 200 MCG/ML in D5W: INTRAVENOUS | Qty: 250 | Status: AC

## 2017-09-18 MED FILL — Verapamil HCl IV Soln 2.5 MG/ML: INTRAVENOUS | Qty: 2 | Status: AC

## 2017-09-18 NOTE — Progress Notes (Signed)
Patient ID: Mike Wright, male   DOB: 1933-09-01, 84 y.o.   MRN: [id number] mrsa bacteremia.  TEE reportedly negative.  I have not seen the final report.  Having some progressive gangrenous changes of his great toes bilaterally.  He is having some soupiness at the base of this with foul odor.  No fluctuance.  Will plan endovascular revascularization of his right leg with Dr. Trula Slade on Wednesday, 09/20/2017.  Had had left leg treatment last week.  Will also undergo bilateral open great toe amputation on Wednesday with Dr. Trula Slade.  Discussed with the patient and his wife present

## 2017-09-18 NOTE — Anesthesia Preprocedure Evaluation (Addendum)
Anesthesia Evaluation  Patient identified by MRN, date of birth, ID band Patient awake    Reviewed: Allergy & Precautions, H&P , NPO status , Patient's Chart, lab work & pertinent test results, reviewed documented beta blocker date and time   Airway Mallampati: II  TM Distance: >3 FB Neck ROM: Full    Dental no notable dental hx. (+) Upper Dentures, Dental Advisory Given   Pulmonary sleep apnea , COPD,  COPD inhaler, former smoker,    Pulmonary exam normal breath sounds clear to auscultation       Cardiovascular hypertension, Pt. on medications and Pt. on home beta blockers + CAD, + Cardiac Stents, + Peripheral Vascular Disease and +CHF  + dysrhythmias Atrial Fibrillation + Valvular Problems/Murmurs AS  Rhythm:Regular Rate:Normal + Systolic murmurs    Neuro/Psych negative neurological ROS  negative psych ROS   GI/Hepatic Neg liver ROS, hiatal hernia, GERD  Medicated and Controlled,  Endo/Other  diabetes, Insulin DependentHypothyroidism   Renal/GU Renal InsufficiencyRenal disease  negative genitourinary   Musculoskeletal  (+) Arthritis , Osteoarthritis,    Abdominal   Peds  Hematology negative hematology ROS (+) anemia ,   Anesthesia Other Findings   Reproductive/Obstetrics negative OB ROS                            Anesthesia Physical Anesthesia Plan  ASA: III  Anesthesia Plan: MAC   Post-op Pain Management:    Induction: Intravenous  PONV Risk Score and Plan: 1 and Propofol infusion  Airway Management Planned: Nasal Cannula  Additional Equipment:   Intra-op Plan:   Post-operative Plan:   Informed Consent: I have reviewed the patients History and Physical, chart, labs and discussed the procedure including the risks, benefits and alternatives for the proposed anesthesia with the patient or authorized representative who has indicated his/her understanding and acceptance.    Dental advisory given  Plan Discussed with: CRNA  Anesthesia Plan Comments:         Anesthesia Quick Evaluation

## 2017-09-18 NOTE — CV Procedure (Signed)
TEE  Pt sedated by anesthesia with Propofol TEE probe advanced to mid esophagus without difficulty  MV normal   Trace MR TV normal   MIld TR AV is mildly thickened, calcified   NO AI   No definite vegetation though cannot exclude colonization PV normal  LVEF and RVEF normal  No PFO by color doppler or with injection of agitated saline   LA, LAA without masses   Spontaneous contrast seen in LA  MIld fixed plaquing of thoracic aorta.  Mike Wright

## 2017-09-18 NOTE — Care Management Note (Signed)
Case Management Note Marvetta Gibbons RN, BSN Unit 4E-Case Manager [phone]  Patient Details  Name: PRASHANT GLOSSER MRN: [id number] Date of Birth: February 01, 1934  Subjective/Objective:   Pt admitted with ischemic feet, s/p aortogram with angioplasty per vascular.  Plan for TEE on 6/24 to check for vegetation- pt currently on Dapto IV for abx tx.                 Action/Plan: PTA Pt lived at home with spouse- PT following for dispo recommendations HHPT prior to surgery/SNF following any surgery interventions. - CM will follow for transition of care needs- ?need for home IV abx.   Expected Discharge Date:                  Expected Discharge Plan:  [place]  In-House Referral:  Clinical Social Work  Discharge planning Services  CM Consult  Post Acute Care Choice:    Choice offered to:     DME Arranged:    DME Agency:     HH Arranged:    [place] Agency:     Status of Service:  In process, will continue to follow  If discussed at Long Length of Stay Meetings, dates discussed:    Discharge Disposition:   Additional Comments:  Dawayne Patricia, RN 09/18/2017, 10:46 AM

## 2017-09-18 NOTE — Progress Notes (Signed)
Internal Medicine Attending:   I saw and examined the patient. I reviewed the resident's note and I agree with the resident's findings and plan as documented in the resident's note.  Hospital day #6 with MRSA bacteremia from bilateral great toe ischemic gangrene. The toes look about the same today, there is worsening odor to them, no drainage. We are continuing with daptomycin for the bacteremia. Blood cultures persistently positive on repeat cultures. I think we will not be able to wait an extra week for revascularization, and we will talk with surgery about amputation sooner to help with source control of this infection. He is getting TEE today to rule out endocarditis. Continue with empiric zosyn for skin and soft tissue infection as well. Greatly appreciate ID consultation.

## 2017-09-18 NOTE — Progress Notes (Signed)
  Echocardiogram Echocardiogram Transesophageal has been performed.  Mike Wright 09/18/2017, 12:51 PM

## 2017-09-18 NOTE — Progress Notes (Signed)
[facility]    Date of Admission:  09/13/2017   Total days of antibiotics 6          ID: Mike Wright is a 84 y.o. male with CAD, PAD admitted for gangrenous great toes complicated MRSA bacteremia Active Problems:   Diabetes ([place])   Essential hypertension   Paroxysmal supraventricular tachycardia (HCC)   CAD (coronary artery disease)   Chronic diastolic CHF (congestive heart failure) (HCC)   COPD (chronic obstructive pulmonary disease) (HCC)   Status post coronary artery stent placement   Gangrene (HCC)   MRSA bacteremia   Urinary retention   Acute pulmonary edema (HCC)    Subjective: Noticing more smell and drainage from right toe. No chills, had TEE which was negative  Medications:  . amLODipine  10 mg Oral Daily  . apixaban  5 mg Oral BID  . aspirin EC  81 mg Oral Daily  . clopidogrel  75 mg Oral Daily  . insulin glargine  30 Units Subcutaneous BID  . lisinopril  5 mg Oral Daily  . metoprolol succinate  100 mg Oral Daily  . mupirocin ointment  1 application Nasal BID  . pantoprazole  40 mg Oral Daily  . psyllium  1 packet Oral Daily  . senna-docusate  1 tablet Oral QHS  . sodium chloride flush  3 mL Intravenous Q12H  . tiotropium  18 mcg Inhalation Daily  . traZODone  100 mg Oral QHS  . vitamin B-12  2,500 mcg Oral Daily    Objective: Vital signs in last 24 hours: Temp:  [97.4 F (36.3 C)-98.8 F (37.1 C)] 97.4 F (36.3 C) (06/24 1309) Pulse Rate:  [69-79] 73 (06/24 1317) Resp:  [18-23] 20 (06/24 1309) BP: (95-171)/(60-84) 154/74 (06/24 1309) SpO2:  [95 %-99 %] 95 % (06/24 1309) Weight:  [209 lb 9.6 oz (95.1 kg)] 209 lb 9.6 oz (95.1 kg) (06/24 0313) Physical Exam  Constitutional: He is oriented to person, place, and time. He appears well-developed and well-nourished. No distress.  HENT:  Mouth/Throat: Oropharynx is clear and moist. No oropharyngeal exudate.  Cardiovascular: Normal rate, regular rhythm and normal heart  sounds. Exam reveals no gallop and no friction rub.  No murmur heard.  Pulmonary/Chest: Effort normal and breath sounds normal. No respiratory distress. He has no wheezes.   Neurological: He is alert and oriented to person, place, and time.  Skin: gangrenous features to encompass all of his great toe bilaterally. He has new lesion dorsum of 4th toe. New area of drainage medial aspect of 1 mtp Psychiatric: tearful   Lab Results Recent Labs    09/16/17 0709 09/17/17 0913 09/18/17 0340  WBC 15.5* 11.5*  --   HGB 9.3* 8.9*  --   HCT 30.0* 28.8*  --   NA 138 136 135  K 3.8 3.9 3.9  CL 103 103 102  CO2 25 25 26   BUN 9 11 9   CREATININE 0.79 0.84 0.82    Microbiology: 6/23 blood cx ngtd 6/21 blood cx 1 of 4 bottle cons 6/19 blood cx mrsa  Studies/Results: No results found.   Assessment/Plan: mrsa bacteremia = continue on daptomycin at  8mg /kg. Would treat for 14 days using today as day 1 if he has amputation for his feet, if not, then would treat for an extended course of minimum of 6 wk.  Gangrene to bilateral feet = continue on piptazo. Agree with decision to have surgery re-evaluate for amputation  Carlyle Basques  [facility] Cell: [phone] Pager: [phone]  09/18/2017, 5:15 PM

## 2017-09-18 NOTE — Transfer of Care (Signed)
Immediate Anesthesia Transfer of Care Note  Patient: Mike Wright  Procedure(s) Performed: TRANSESOPHAGEAL ECHOCARDIOGRAM (TEE) (N/A )  Patient Location: Endoscopy Unit  Anesthesia Type:MAC  Level of Consciousness: drowsy  Airway & Oxygen Therapy: Patient Spontanous Breathing and Patient connected to nasal cannula oxygen  Post-op Assessment: Report given to RN and Post -op Vital signs reviewed and stable  Post vital signs: Reviewed and stable  Last Vitals:  Vitals Value Taken Time  BP 95/72 09/18/2017 12:40 PM  Temp 36.9 C 09/18/2017 12:40 PM  Pulse 79 09/18/2017 12:41 PM  Resp 22 09/18/2017 12:41 PM  SpO2 94 % 09/18/2017 12:41 PM  Vitals shown include unvalidated device data.  Last Pain:  Vitals:   09/18/17 1240  TempSrc: Oral  PainSc: 0-No pain         Complications: No apparent anesthesia complications

## 2017-09-18 NOTE — Progress Notes (Signed)
Eliquis discontinued with plans for angio of right leg and bilateral great toe amputation on Wednesday.   Mike Wright

## 2017-09-18 NOTE — Progress Notes (Signed)
Subjective: The patient was resting in his bed today upon entering the room. He continues to state that he feels well and denies acute concerns. He understands that until we have removed the source of his infection he will need to remain on antibiotics and agrees that he would like to move forward with removal of his toes.   Objective:  Vital signs in last 24 hours: Vitals:   09/17/17 1115 09/17/17 2031 09/18/17 0313 09/18/17 0521  BP: (!) 153/74 (!) 141/74  (!) 148/60  Pulse: 67 78  78  Resp: 17 18  18   Temp:  98.8 F (37.1 C)  98.3 F (36.8 C)  TempSrc:  Oral  Oral  SpO2: 100% 97%  95%  Weight:   209 lb 9.6 oz (95.1 kg)   Height:       Physical Exam  Constitutional: He appears well-developed. No distress.  Cardiovascular: Normal rate and regular rhythm.  Murmur heard. Pulmonary/Chest: Effort normal and breath sounds normal. No stridor. No respiratory distress.  Abdominal: Soft. Bowel sounds are normal. He exhibits no distension.  Musculoskeletal: He exhibits tenderness and deformity (Bilateral gangrenous great toes. Continue to darken). He exhibits no edema.  Neurological: He is alert.  Skin: He is not diaphoretic.  Vitals reviewed.  Assessment/Plan:  Active Problems:   Diabetes ([place])   Essential hypertension   Paroxysmal supraventricular tachycardia (HCC)   CAD (coronary artery disease)   Chronic diastolic CHF (congestive heart failure) (HCC)   COPD (chronic obstructive pulmonary disease) (HCC)   Status post coronary artery stent placement   Gangrene (HCC)   MRSA bacteremia   Urinary retention   Acute pulmonary edema ([place])  Assessment:  Mike Wright is an 84yo Mwith PMHx notable forCAD ( s/p stenting x2 to LAD and x1 to diag in February 2019), atrial fibrillation with paroxysmal supraventricular tachycardia, type 2 diabetes ( on insulin) who presents for evaluation of pain and blackening of his large toes on both feet. The presentation was concerning for severe  distal PAD with secondary dry gangrene with concern for secondary infection given that the skin barrier was broken multiple times in a non-sterile environment. Hewasadmitted for evaluation and inpatient intervention.   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 Their plan is to have the patient return in one week for intervention of the right posterior tibial artery and possibly peroneal arteries.  --Return on July 2nd for right sided angiogram as per Vascular --Called the on call Vascular surgeon to discuss source control concerns. As the patient is persistently bacteriemic I feel source control is paramount and will need to be addressed prior to July 2nd? --Continue Zosyn as per ID for gram negative and anaerobic coverage  MRSA: Observed on Culture routinely taken at admission. Repeat Bl Cx w/ gram positive cocci.  --TTE w/o vegetation commented on --TEE completed w/o vegetation on 09/18/2017 --Daptomycin 8mg /kg x 96.8kg 06/20 >> --Repeat Cx's with G+ cocci --Daily CBC's  Pulmonary Edema: Resolved. Denied orthopnea. Comfortable on room air. Continue to monitor.  --Continue strict I/O's and daily weights --Repeat Lasix PRN for edema/dyspnea  P-A-fib: On Eliquis at home. --Eliquis 5mg  BID  Type 2 DM No recent A1c on filebut7.1% on admission. On lantus 40 mg qAM 30 mg qPM at home. --Lantus continued at 30U as patient is not consuming large portions of his meals near his home dose --CBG with  meals and QHS --BMP daily  CAD: S/P LHC in 04/2017 with drug eluting stents placed. Will need to remain on Plavix. Denied ongoing chest pain.  --Continued plavix 75mg  daily --Continue home atorvastatin  --Continue daily ASA 81mg   COPD: No acute issues. --Continue Spiriva --Continue albuterol PRN  HTN: We  will continue his home Amlodipine 10mg  daily, and Lisinopril 5mg  daily and make the recommendation that they consider increasing the lisinopril at his outpatient visits.   Diet:Heart Healthy Code: Full Fluids: n/a GI PPX: Home Protonix 40mg  daily VTE PPX: Eliquis  Dispo: Anticipated discharge in approximately 3-4 day(s).   Mike Ludwig, MD 09/18/2017, 6:29 AM Pager: Pager# [phone]

## 2017-09-18 NOTE — Progress Notes (Signed)
Inpatient Diabetes Program Recommendations  AACE/ADA: New Consensus Statement on Inpatient Glycemic Control (2019)  Target Ranges:  Prepandial:   less than 140 mg/dL      Peak postprandial:   less than 180 mg/dL (1-2 hours)      Critically ill patients:  140 - 180 mg/dL   Results for ROHN, FRITSCH (MRN [id number]) as of 09/18/2017 10:16  Ref. Range 09/17/2017 06:07 09/17/2017 11:13 09/17/2017 16:24 09/17/2017 21:54 09/18/2017 06:02  Glucose-Capillary Latest Ref Range: 65 - 99 mg/dL 152 (H) 232 (H) 270 (H) 349 (H) 159 (H)   Review of Glycemic Control  Diabetes history: DM2 Outpatient Diabetes medications: Lantus 40 units QAM, Lantus 30 units QHS, Metformin 1000 mg BID Current orders for Inpatient glycemic control: Lantus 30 units BID  Inpatient Diabetes Program Recommendations: Insulin - Basal: Noted Lantus was increased from 18 units BID to 30 units BID. Fasting glucose 159 mg/dl today. Recommend changing Lantus to 20 units QAM and Lantus 18 units QHS. Correction (SSI): Please consider ordering CBGs with Novolog 0-9 units TID with meals and Novolog 0-5 units QHS. Insulin - Meal Coverage: Once diet is ordered, please consider ordering Novolog 3 units TID with meal for meal coverage if patient eats at least 50% of meals.  Thanks, Barnie Alderman, RN, MSN, CDE Diabetes Coordinator Inpatient Diabetes Program [phone] (Team Pager from 8am to 5pm)

## 2017-09-18 NOTE — Interval H&P Note (Signed)
History and Physical Interval Note:  09/18/2017 12:08 PM  Mike Wright  has presented today for surgery, with the diagnosis of BACTEREMIA  The various methods of treatment have been discussed with the patient and family. After consideration of risks, benefits and other options for treatment, the patient has consented to  Procedure(s): TRANSESOPHAGEAL ECHOCARDIOGRAM (TEE) (N/A) as a surgical intervention .  The patient's history has been reviewed, patient examined, no change in status, stable for surgery.  I have reviewed the patient's chart and labs.  Questions were answered to the patient's satisfaction.     Dorris Carnes

## 2017-09-18 NOTE — Progress Notes (Signed)
Physical Therapy Treatment Patient Details Name: Mike Wright MRN: [id number] DOB: 1934-01-15 Today's Date: 09/18/2017    History of Present Illness Pt is an 84 y/o male admitted secondary to bilat great toe ischemic gangrene. Per RN for angiogram possibly on 6/21, and will likely have great toe amputation. PMH includes DM, HTN, CAD, dCHF, COPD, and stent placement.     PT Comments    Pt tolerated session well.  Somewhat dizzy throughout but reports mild and does not affect mobility.  Requiring increased assist for bed mobility today.  Supervision/min guard for gait due to reports of multiple falls.  If pt to go for amputation will likely need f/u with sub acute rehab prior to d/c home to maximize independence and decrease risk of falling given history of falls at home.      Follow Up Recommendations  Other (comment);Supervision for mobility/OOB(HHPT and 24/7 if d/c before surgery, SNF after surgery)     Equipment Recommendations  None recommended by PT    Recommendations for Other Services       Precautions / Restrictions Precautions Precautions: Fall Restrictions Weight Bearing Restrictions: No    Mobility  Bed Mobility Overal bed mobility: Needs Assistance Bed Mobility: Supine to Sit;Sit to Supine     Supine to sit: Mod assist Sit to supine: Min assist      Transfers Overall transfer level: Needs assistance Equipment used: Rolling walker (2 wheeled) Transfers: Sit to/from Stand Sit to Stand: Supervision            Ambulation/Gait Ambulation/Gait assistance: Min guard Gait Distance (Feet): 20 Feet Assistive device: Rolling walker (2 wheeled)       General Gait Details: reports multiple falls in the last 6 mos, so min guard for safety   Stairs             Wheelchair Mobility    Modified Rankin (Stroke Patients Only)       Balance                                            Cognition Arousal/Alertness:  Awake/alert Behavior During Therapy: WFL for tasks assessed/performed Overall Cognitive Status: Within Functional Limits for tasks assessed                                        Exercises      General Comments        Pertinent Vitals/Pain Pain Assessment: 0-10 Pain Score: 4  Pain Location: bilat great toes  Pain Descriptors / Indicators: Aching Pain Intervention(s): Limited activity within patient's tolerance;Monitored during session    Home Living                      Prior Function            PT Goals (current goals can now be found in the care plan section) Acute Rehab PT Goals Patient Stated Goal: to stop falling PT Goal Formulation: With patient Time For Goal Achievement: 09/29/17 Potential to Achieve Goals: Good Progress towards PT goals: Progressing toward goals    Frequency    Min 3X/week      PT Plan Current plan remains appropriate    Co-evaluation  AM-PAC PT "6 Clicks" Daily Activity  Outcome Measure  Difficulty turning over in bed (including adjusting bedclothes, sheets and blankets)?: A Lot Difficulty moving from lying on back to sitting on the side of the bed? : A Lot Difficulty sitting down on and standing up from a chair with arms (e.g., wheelchair, bedside commode, etc,.)?: A Little Help needed moving to and from a bed to chair (including a wheelchair)?: A Little Help needed walking in hospital room?: A Little Help needed climbing 3-5 steps with a railing? : A Lot 6 Click Score: 15    End of Session Equipment Utilized During Treatment: Gait belt Activity Tolerance: Patient tolerated treatment well Patient left: in bed;with call bell/phone within reach;with family/visitor present Nurse Communication: Mobility status PT Visit Diagnosis: Unsteadiness on feet (R26.81)     Time: 1010-1025 PT Time Calculation (min) (ACUTE ONLY): 15 min  Charges:  $Gait Training: 8-22 mins                    G  Codes:          Michel Santee 09/18/2017, 10:39 AM

## 2017-09-18 NOTE — Progress Notes (Signed)
OT Cancellation    09/18/17 1100  OT Visit Information  Last OT Received On 09/18/17  Reason Eval/Treat Not Completed Patient at procedure or test/ unavailable (Endo. Will return as schedule allows.)   Stevenson Windmiller MSOT, OTR/L Acute Rehab Pager: [phone] Office: [phone]

## 2017-09-18 NOTE — Anesthesia Postprocedure Evaluation (Signed)
Anesthesia Post Note  Patient: Mike Wright  Procedure(s) Performed: TRANSESOPHAGEAL ECHOCARDIOGRAM (TEE) (N/A )     Patient location during evaluation: PACU Anesthesia Type: MAC Level of consciousness: awake and alert Pain management: pain level controlled Vital Signs Assessment: post-procedure vital signs reviewed and stable Respiratory status: spontaneous breathing, nonlabored ventilation and respiratory function stable Cardiovascular status: stable and blood pressure returned to baseline Postop Assessment: no apparent nausea or vomiting Anesthetic complications: no    Last Vitals:  Vitals:   09/18/17 1309 09/18/17 1317  BP: (!) 154/74   Pulse: 75 73  Resp: 20   Temp: (!) 36.3 C   SpO2: 95%     Last Pain:  Vitals:   09/18/17 1309  TempSrc: Oral  PainSc: 0-No pain                 Deborra Phegley,W. EDMOND

## 2017-09-19 ENCOUNTER — Encounter: Payer: Medicare HMO | Admitting: Vascular Surgery

## 2017-09-19 ENCOUNTER — Inpatient Hospital Stay: Payer: Self-pay

## 2017-09-19 ENCOUNTER — Encounter

## 2017-09-19 LAB — CBC
HEMATOCRIT: 27.9 % — AB (ref 39.0–52.0)
Hemoglobin: 8.7 g/dL — ABNORMAL LOW (ref 13.0–17.0)
MCH: 24.9 pg — ABNORMAL LOW (ref 26.0–34.0)
MCHC: 31.2 g/dL (ref 30.0–36.0)
MCV: 79.7 fL (ref 78.0–100.0)
Platelets: 271 10*3/uL (ref 150–400)
RBC: 3.5 MIL/uL — ABNORMAL LOW (ref 4.22–5.81)
RDW: 16.4 % — ABNORMAL HIGH (ref 11.5–15.5)
WBC: 9.9 10*3/uL (ref 4.0–10.5)

## 2017-09-19 LAB — GLUCOSE, CAPILLARY
GLUCOSE-CAPILLARY: 126 mg/dL — AB (ref 70–99)
Glucose-Capillary: 226 mg/dL — ABNORMAL HIGH (ref 70–99)
Glucose-Capillary: 234 mg/dL — ABNORMAL HIGH (ref 70–99)
Glucose-Capillary: 242 mg/dL — ABNORMAL HIGH (ref 70–99)
Glucose-Capillary: 257 mg/dL — ABNORMAL HIGH (ref 70–99)

## 2017-09-19 LAB — CULTURE, BLOOD (ROUTINE X 2): Special Requests: ADEQUATE

## 2017-09-19 MED ORDER — SODIUM CHLORIDE 0.9% FLUSH
10.0000 mL | INTRAVENOUS | Status: DC | PRN
  Administered 2017-09-25: 10 mL
  Filled 2017-09-19: qty 40

## 2017-09-19 MED ORDER — INSULIN GLARGINE 100 UNIT/ML ~~LOC~~ SOLN
15.0000 [IU] | Freq: Two times a day (BID) | SUBCUTANEOUS | Status: DC
  Administered 2017-09-19 – 2017-09-20 (×2): 15 [IU] via SUBCUTANEOUS
  Filled 2017-09-19 (×5): qty 0.15

## 2017-09-19 MED ORDER — INSULIN ASPART 100 UNIT/ML ~~LOC~~ SOLN
0.0000 [IU] | SUBCUTANEOUS | Status: AC
  Administered 2017-09-19: 5 [IU] via SUBCUTANEOUS
  Administered 2017-09-20: 3 [IU] via SUBCUTANEOUS
  Administered 2017-09-20: 5 [IU] via SUBCUTANEOUS

## 2017-09-19 MED ORDER — CEFAZOLIN SODIUM-DEXTROSE 1-4 GM/50ML-% IV SOLN
1.0000 g | INTRAVENOUS | Status: DC
  Filled 2017-09-19: qty 50

## 2017-09-19 MED ORDER — SODIUM CHLORIDE 0.9% FLUSH
10.0000 mL | Freq: Two times a day (BID) | INTRAVENOUS | Status: DC
  Administered 2017-09-19 – 2017-09-21 (×4): 10 mL
  Administered 2017-09-22: 20 mL
  Administered 2017-09-22 – 2017-09-25 (×4): 10 mL

## 2017-09-19 NOTE — Progress Notes (Addendum)
Dr. Hadassah Pais at this time. MD aware of patients HR drop while sleeping. Patient alert and oriented. No symptoms. Will continue to monitor.  Emelda Fear, RN

## 2017-09-19 NOTE — Progress Notes (Signed)
Occupational Therapy Treatment Patient Details Name: Mike Wright MRN: [id number] DOB: [birth year] Today's Date: 09/19/2017    History of present illness Pt is an 84 y/o male admitted secondary to bilat great toe ischemic gangrene. Per RN for angiogram possibly on 6/21, and will likely have great toe amputation. PMH includes DM, HTN, CAD, dCHF, COPD, and stent placement.    OT comments  Pt progressing towards established OT goals and is agreeable to participate in therapy. Pt performing hallway distance mobility and continues to rely on UE support at RW to decrease pain at BLEs. VSS throughout. Pt asking for juice at end of session; RN notified. Pt planning for surgery tomorrow and will update dc recommendation post surgery; pt will likely need post-acute rehab. Will continue to follow acutely as admitted.    Follow Up Recommendations  Other (comment)(Pending progress post surgery)    Equipment Recommendations  None recommended by OT    Recommendations for Other Services PT consult    Precautions / Restrictions Precautions Precautions: Fall Restrictions Weight Bearing Restrictions: No       Mobility Bed Mobility Overal bed mobility: Needs Assistance Bed Mobility: Supine to Sit;Sit to Supine     Supine to sit: Min guard Sit to supine: Min assist   General bed mobility comments: Min GUard for safety. Min A for return to supine with requiring support at trunk  Transfers Overall transfer level: Needs assistance Equipment used: Rolling walker (2 wheeled) Transfers: Sit to/from Stand Sit to Stand: Min assist         General transfer comment: Min A to power up from EOB and lower surface    Balance Overall balance assessment: Needs assistance Sitting-balance support: No upper extremity supported;Feet supported Sitting balance-Leahy Scale: Fair     Standing balance support: Single extremity supported;During functional activity Standing balance-Leahy Scale:  Poor Standing balance comment: Reliant on UE support                            ADL either performed or assessed with clinical judgement   ADL Overall ADL's : Needs assistance/impaired                         Toilet Transfer: Minimal assistance;Ambulation;RW(Simulated to in room) Toilet Transfer Details (indicate cue type and reason): Pt performing funcitonal mobiltiy at hall way distance and then simualted toielt transfer with Min A for safety and balance. Pt requiring Mi nA for power up into standing as well as safe descent         Functional mobility during ADLs: Min guard;Rolling walker General ADL Comments: Pt highly motivated to participate in therapy. Pt requiring MIn Guard-Min A. Pt reporting that he is scheduled for surgery tomorrow to amputate BLEs (toes)     Vision       Perception     Praxis      Cognition Arousal/Alertness: Awake/alert Behavior During Therapy: WFL for tasks assessed/performed Overall Cognitive Status: Within Functional Limits for tasks assessed                                          Exercises     Shoulder Instructions       General Comments Wife present throughout session. VSS    Pertinent Vitals/ Pain       Pain Assessment: Faces  Faces Pain Scale: Hurts even more Pain Location: bilat great toes  Pain Descriptors / Indicators: Aching Pain Intervention(s): Monitored during session;Limited activity within patient's tolerance;Repositioned  Home Living                                          Prior Functioning/Environment              Frequency  Min 2X/week        Progress Toward Goals  OT Goals(current goals can now be found in the care plan section)  Progress towards OT goals: Progressing toward goals  Acute Rehab OT Goals Patient Stated Goal: to stop falling OT Goal Formulation: With patient Time For Goal Achievement: 09/30/17 Potential to Achieve Goals:  Good ADL Goals Pt Will Perform Grooming: with modified independence;standing Pt Will Perform Lower Body Dressing: with modified independence;with adaptive equipment;sit to/from stand Pt Will Transfer to Toilet: with modified independence;ambulating;bedside commode Pt Will Perform Toileting - Clothing Manipulation and hygiene: with modified independence;sit to/from stand Pt Will Perform Tub/Shower Transfer: Tub transfer;ambulating;shower seat;with supervision;rolling walker  Plan Discharge plan remains appropriate    Co-evaluation                 AM-PAC PT "6 Clicks" Daily Activity     Outcome Measure   Help from another person eating meals?: None Help from another person taking care of personal grooming?: A Little Help from another person toileting, which includes using toliet, bedpan, or urinal?: A Little Help from another person bathing (including washing, rinsing, drying)?: A Little Help from another person to put on and taking off regular upper body clothing?: A Little Help from another person to put on and taking off regular lower body clothing?: A Little 6 Click Score: 19    End of Session Equipment Utilized During Treatment: Gait belt;Rolling walker  OT Visit Diagnosis: Unsteadiness on feet (R26.81);Other abnormalities of gait and mobility (R26.89);Muscle weakness (generalized) (M62.81);Pain Pain - Right/Left: (Bilateral) Pain - part of body: Ankle and joints of foot(Toes)   Activity Tolerance Patient tolerated treatment well   Patient Left with call bell/phone within reach;in bed;with family/visitor present   Nurse Communication Mobility status;Precautions;Other (comment)(Pt wanting apple juice)        Time: 4920-1007 OT Time Calculation (min): 22 min  Charges: OT General Charges $OT Visit: 1 Visit OT Treatments $Therapeutic Activity: 8-22 mins  Becker, OTR/L Acute Rehab Pager: [phone] Office: [place] 09/19/2017, 4:13 PM

## 2017-09-19 NOTE — Progress Notes (Signed)
CCMD called and said that patient's HR dropped to 48 but now back up to 60-70's. Upon assessment, patient was napping. Will continue to monitor.

## 2017-09-19 NOTE — Progress Notes (Addendum)
Subjective: The patient was lying in his bed today upon entering the room. He denied acute concerns including orthopnea or dyspnea with exertion. He is in agreement with the plan to undergo amputation of his great toes bilaterally as well as PCI of the right lower extremity as planned and described to him by vascular surgery.   Objective:  Vital signs in last 24 hours: Vitals:   09/18/17 2215 09/19/17 0353 09/19/17 0357 09/19/17 0510  BP: 136/69  135/75   Pulse: 77     Resp: (!) 26  (!) 21 (!) 23  Temp: 97.9 F (36.6 C)  97.9 F (36.6 C)   TempSrc: Oral  Oral   SpO2: 96%   93%  Weight:  205 lb 12.8 oz (93.4 kg)    Height:       Physical Exam  Constitutional: He appears well-nourished. No distress.  Cardiovascular: Normal rate and regular rhythm.  No murmur heard. Pulmonary/Chest: Effort normal and breath sounds normal. No respiratory distress.  Abdominal: Soft. Bowel sounds are normal. He exhibits no distension.  Musculoskeletal: He exhibits deformity (Bilateral great toes, no improvement daily). He exhibits no edema or tenderness.  Neurological: He is alert.  Skin: Skin is warm. He is not diaphoretic. There is erythema.  Psychiatric: He has a normal mood and affect.   Assessment/Plan:  Active Problems:   Diabetes ([place])   Essential hypertension   Paroxysmal supraventricular tachycardia (HCC)   CAD (coronary artery disease)   Chronic diastolic CHF (congestive heart failure) (HCC)   COPD (chronic obstructive pulmonary disease) (HCC)   Status post coronary artery stent placement   Gangrene (HCC)   MRSA bacteremia   Urinary retention   Acute pulmonary edema ([place])  Assessment:  [name] is an 84yo Mwith PMHx notable forCAD ( s/p stenting x2 to LAD and x1 to diag in February 2019), atrial fibrillation with paroxysmal supraventricular tachycardia, type 2 diabetes ( on insulin) who presents for evaluation of pain and blackening of his large toes on both feet. The  presentationwas concerning for severe distal PAD with secondary dry gangrene with concern for secondary infection given that the skin barrier was broken multiple times in a non-sterile environment. Hewasadmitted for evaluation and inpatient intervention.   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 Their plan is to have the patient return in one week for intervention of the right posterior tibial artery and possibly peroneal arteries.  --Vascular surgery is Dr. Trula Slade with Dr. Donnetta Hutching, to perform bilateral amputation as well as right-sided revascularization on 09/20/2017 as per their notes.  We appreciate their continued assistance with this patient and agree with this plan. --Continue Zosyn as per IDfor gram negative and anaerobic coverage  MRSA: Observed on Culture routinely taken at admission. Repeat Bl Cx w/ gram positive cocci on 09/15/2017.  CBC on 6/25 leukocytosis initiating stable but mildly worsening anemia. --TTE w/o vegetation commented on --TEE completed w/o vegetation on 09/18/2017 --Daptomycin 8mg /kg x 96.8kg06/20 >>... As per IDs recommendation would count today as day 1 if the patient undergoes amputation of bilateral great toes and complete a 14-day course of daptomycin from today. --Would discuss with ID the possibility of discontinuing the Daptomycin in favor of Vancomycin upon discharge.  --Will need a PICC line placed today in anticipation of IV antibiotics for 14 days --Repeat Cx's on 6/23 negative to date --Repeat Cx's on 6/25  pending --Daily CBC's  Pulmonary Edema: Resolved. Denied orthopnea. Comfortable on room air. Continue to monitor.  --Continue strict I/O's and daily weights --Repeat Lasix PRN for edema/dyspnea  P-A-fib: On Eliquis at home.  Eliquis held due to pending procedure  Type  2 DM No recent A1c on filebut7.1% on admission. On lantus 40 mg qAM 30 mg qPM at home. --Lantus continued at 15U BID this evening and tomorrow morning due to surgery --Lantus continued at 30U as patient is not consuming large portions of his meals  --CBG with meals and QHS --BMP daily  CAD: S/P LHC in 04/2017 with drug eluting stents placed. Will need to remain on Plavix. Denied ongoing chest pain.  --Continued plavix 75mg  daily --Continue home atorvastatin  --Continue daily ASA 81mg   COPD: No acute issues. --Continue Spiriva --Continue albuterol PRN  HTN: We will continue his home Amlodipine 10mg  daily, and Lisinopril 5mg  dailyand make the recommendation that they consider increasing the lisinopril at his outpatient visits his blood pressure continues to be elevated.   Diet:Heart Healthy Code: Full Fluids: n/a GI PPX: Home Protonix 40mg  daily VTE [id number] Dispo: Anticipated discharge in approximately 2 day(s).   Kathi Ludwig, MD 09/19/2017, 6:28 AM Pager: Pager# [phone]

## 2017-09-19 NOTE — Progress Notes (Signed)
Inpatient Diabetes Program Recommendations  AACE/ADA: New Consensus Statement on Inpatient Glycemic Control (2015)  Target Ranges:  Prepandial:   less than 140 mg/dL      Peak postprandial:   less than 180 mg/dL (1-2 hours)      Critically ill patients:  140 - 180 mg/dL   Lab Results  Component Value Date   GLUCAP 234 (H) 09/19/2017   HGBA1C 7.1 (H) 09/14/2017    Review of Glycemic ControlResults for MALAKHAI, BEITLER (MRN [id number]) as of 09/19/2017 14:15  Ref. Range 09/18/2017 13:07 09/18/2017 16:30 09/18/2017 22:10 09/19/2017 06:18 09/19/2017 11:50  Glucose-Capillary Latest Ref Range: 70 - 99 mg/dL 130 (H) 206 (H) 182 (H) 126 (H) 234 (H)  Diabetes history: DM2 Outpatient Diabetes medications: Lantus 40 units QAM, Lantus 30 units QHS, Metformin 1000 mg BID Current orders for Inpatient glycemic control: Lantus 30 units BID  Inpatient Diabetes Program Recommendations:  Please add Novolog sensitive correction tid with meals and HS while eating.  When patient is NPO, consider adding Novolog sensitive correction q 4 hours. Attempted to page MD to discuss.   Thanks,  Adah Perl, RN, BC-ADM Inpatient Diabetes Coordinator Pager [phone] (8a-5p)

## 2017-09-19 NOTE — Progress Notes (Signed)
Internal Medicine Attending:   I saw and examined the patient. I reviewed the resident's note and I agree with the resident's findings and plan as documented in the resident's note.  Hospital day #7 with MRSA bacteremia and bilateral gangrene toes. We greatly appreciate vascular surgery re-evaluation yesterday, and I agree with the plan for amputations tomorrow. This will help [place] ensure source control of his infection. Blood cultures are NGx1 day, which is good. I agree with 14 day treatment for the MRSA bacteremia, may be able to switch dapto to vancomycin after the angiogram.

## 2017-09-19 NOTE — Progress Notes (Signed)
Peripherally Inserted Central Catheter/Midline Placement  The IV Nurse has discussed with the patient and/or persons authorized to consent for the patient, the purpose of this procedure and the potential benefits and risks involved with this procedure.  The benefits include less needle sticks, lab draws from the catheter, and the patient may be discharged home with the catheter. Risks include, but not limited to, infection, bleeding, blood clot (thrombus formation), and puncture of an artery; nerve damage and irregular heartbeat and possibility to perform a PICC exchange if needed/ordered by physician.  Alternatives to this procedure were also discussed.  Bard Power PICC patient education guide, fact sheet on infection prevention and patient information card has been provided to patient /or left at bedside.    PICC/Midline Placement Documentation  PICC Double Lumen 43/56/86 PICC Right Basilic 42 cm 0 cm (Active)  Indication for Insertion or Continuance of Line Prolonged intravenous therapies 09/19/2017  8:25 PM  Exposed Catheter (cm) 0 cm 09/19/2017  8:25 PM  Site Assessment Clean;Dry;Intact 09/19/2017  8:25 PM  Lumen #1 Status Blood return noted;Flushed;Saline locked 09/19/2017  8:25 PM  Lumen #2 Status Blood return noted;Flushed;Saline locked 09/19/2017  8:25 PM  Dressing Type Transparent;Occlusive 09/19/2017  8:25 PM  Dressing Status Clean;Dry;Intact;Antimicrobial disc in place 09/19/2017  8:25 PM  Dressing Intervention New dressing 09/19/2017  8:25 PM  Dressing Change Due 09/26/17 09/19/2017  8:25 PM       Aldona Lento L 09/19/2017, 8:46 PM

## 2017-09-20 ENCOUNTER — Encounter (HOSPITAL_COMMUNITY)
Admission: EM | Disposition: A | Discharge status: Skilled Nursing Facility | Payer: Self-pay | Source: Home / Self Care | Attending: Student in an Organized Health Care Education/Training Program

## 2017-09-20 ENCOUNTER — Encounter (HOSPITAL_COMMUNITY): Payer: Self-pay | Admitting: Orthopedic Surgery

## 2017-09-20 ENCOUNTER — Inpatient Hospital Stay (HOSPITAL_COMMUNITY): Payer: Medicare HMO | Admitting: Anesthesiology

## 2017-09-20 DIAGNOSIS — Z89411 Acquired absence of right great toe: Secondary | ICD-10-CM

## 2017-09-20 DIAGNOSIS — Z89412 Acquired absence of left great toe: Secondary | ICD-10-CM

## 2017-09-20 HISTORY — PX: LOWER EXTREMITY ANGIOGRAM: SHX5508

## 2017-09-20 HISTORY — PX: ANGIOPLASTY: SHX39

## 2017-09-20 HISTORY — PX: AMPUTATION: SHX166

## 2017-09-20 LAB — BASIC METABOLIC PANEL
Anion gap: 8 (ref 5–15)
BUN: 9 mg/dL (ref 8–23)
CALCIUM: 8.5 mg/dL — AB (ref 8.9–10.3)
CHLORIDE: 105 mmol/L (ref 98–111)
CO2: 26 mmol/L (ref 22–32)
CREATININE: 0.81 mg/dL (ref 0.61–1.24)
GFR calc non Af Amer: >60 mL/min (ref >60)
Glucose, Bld: 125 mg/dL — ABNORMAL HIGH (ref 70–99)
Potassium: 4.1 mmol/L (ref 3.5–5.1)
SODIUM: 139 mmol/L (ref 135–145)

## 2017-09-20 LAB — POCT ACTIVATED CLOTTING TIME: Activated Clotting Time: 345 seconds

## 2017-09-20 LAB — GLUCOSE, CAPILLARY
GLUCOSE-CAPILLARY: 138 mg/dL — AB (ref 70–99)
GLUCOSE-CAPILLARY: 125 mg/dL — AB (ref 70–99)
Glucose-Capillary: 125 mg/dL — ABNORMAL HIGH (ref 70–99)
Glucose-Capillary: 130 mg/dL — ABNORMAL HIGH (ref 70–99)
Glucose-Capillary: 137 mg/dL — ABNORMAL HIGH (ref 70–99)
Glucose-Capillary: 252 mg/dL — ABNORMAL HIGH (ref 70–99)

## 2017-09-20 LAB — CBC
HCT: 30.5 % — ABNORMAL LOW (ref 39.0–52.0)
HEMOGLOBIN: 9.4 g/dL — AB (ref 13.0–17.0)
MCH: 25.1 pg — ABNORMAL LOW (ref 26.0–34.0)
MCHC: 30.8 g/dL (ref 30.0–36.0)
MCV: 81.6 fL (ref 78.0–100.0)
PLATELETS: 376 10*3/uL (ref 150–400)
RBC: 3.74 MIL/uL — AB (ref 4.22–5.81)
RDW: 16.6 % — ABNORMAL HIGH (ref 11.5–15.5)
WBC: 9.1 10*3/uL (ref 4.0–10.5)

## 2017-09-20 LAB — CULTURE, BLOOD (ROUTINE X 2)
Culture: NO GROWTH
Special Requests: ADEQUATE

## 2017-09-20 LAB — PROTIME-INR
INR: 1.2
Prothrombin Time: 15.1 seconds (ref 11.4–15.2)

## 2017-09-20 SURGERY — ANGIOGRAM, LOWER EXTREMITY
Anesthesia: General | Site: Leg Lower | Laterality: Right

## 2017-09-20 MED ORDER — FENTANYL CITRATE (PF) 250 MCG/5ML IJ SOLN
INTRAMUSCULAR | Status: AC
  Filled 2017-09-20: qty 5

## 2017-09-20 MED ORDER — PROPOFOL 10 MG/ML IV BOLUS
INTRAVENOUS | Status: AC
  Filled 2017-09-20: qty 20

## 2017-09-20 MED ORDER — LIDOCAINE HCL (PF) 1 % IJ SOLN
INTRAMUSCULAR | Status: AC
  Filled 2017-09-20: qty 30

## 2017-09-20 MED ORDER — FENTANYL CITRATE (PF) 100 MCG/2ML IJ SOLN
12.5000 ug | INTRAMUSCULAR | Status: DC | PRN
  Administered 2017-09-20 – 2017-09-25 (×5): 12.5 ug via INTRAVENOUS
  Filled 2017-09-20 (×5): qty 2

## 2017-09-20 MED ORDER — SUGAMMADEX SODIUM 200 MG/2ML IV SOLN
INTRAVENOUS | Status: DC | PRN
  Administered 2017-09-20: 200 mg via INTRAVENOUS

## 2017-09-20 MED ORDER — SODIUM CHLORIDE 0.9 % IV SOLN
INTRAVENOUS | Status: DC
  Administered 2017-09-20: 03:00:00 via INTRAVENOUS

## 2017-09-20 MED ORDER — 0.9 % SODIUM CHLORIDE (POUR BTL) OPTIME
TOPICAL | Status: DC | PRN
  Administered 2017-09-20: 1000 mL

## 2017-09-20 MED ORDER — LACTATED RINGERS IV SOLN
INTRAVENOUS | Status: DC
  Administered 2017-09-20: 11:00:00 via INTRAVENOUS

## 2017-09-20 MED ORDER — EPHEDRINE SULFATE 50 MG/ML IJ SOLN
INTRAMUSCULAR | Status: AC
  Filled 2017-09-20: qty 1

## 2017-09-20 MED ORDER — PHENYLEPHRINE 40 MCG/ML (10ML) SYRINGE FOR IV PUSH (FOR BLOOD PRESSURE SUPPORT)
PREFILLED_SYRINGE | INTRAVENOUS | Status: AC
  Filled 2017-09-20: qty 20

## 2017-09-20 MED ORDER — ONDANSETRON HCL 4 MG/2ML IJ SOLN
INTRAMUSCULAR | Status: AC
  Filled 2017-09-20: qty 4

## 2017-09-20 MED ORDER — METOPROLOL TARTRATE 50 MG PO TABS
50.0000 mg | ORAL_TABLET | Freq: Once | ORAL | Status: AC
  Administered 2017-09-20: 50 mg via ORAL

## 2017-09-20 MED ORDER — DEXAMETHASONE SODIUM PHOSPHATE 10 MG/ML IJ SOLN
INTRAMUSCULAR | Status: AC
  Filled 2017-09-20: qty 1

## 2017-09-20 MED ORDER — SODIUM CHLORIDE 0.9 % IR SOLN
Status: DC | PRN
  Administered 2017-09-20: 1000 mL

## 2017-09-20 MED ORDER — PROPOFOL 10 MG/ML IV BOLUS
INTRAVENOUS | Status: DC | PRN
  Administered 2017-09-20: 90 mg via INTRAVENOUS

## 2017-09-20 MED ORDER — LIDOCAINE HCL (CARDIAC) PF 100 MG/5ML IV SOSY
PREFILLED_SYRINGE | INTRAVENOUS | Status: DC | PRN
  Administered 2017-09-20: 100 mg via INTRAVENOUS

## 2017-09-20 MED ORDER — METOPROLOL TARTRATE 50 MG PO TABS
ORAL_TABLET | ORAL | Status: AC
  Administered 2017-09-20: 50 mg via ORAL
  Filled 2017-09-20: qty 1

## 2017-09-20 MED ORDER — ROCURONIUM BROMIDE 100 MG/10ML IV SOLN
INTRAVENOUS | Status: DC | PRN
  Administered 2017-09-20: 50 mg via INTRAVENOUS

## 2017-09-20 MED ORDER — SODIUM CHLORIDE 0.9 % IV SOLN
0.2500 mg/kg/h | INTRAVENOUS | Status: AC
  Administered 2017-09-20: 1.75 mg/kg/h via INTRAVENOUS
  Filled 2017-09-20: qty 250

## 2017-09-20 MED ORDER — FENTANYL CITRATE (PF) 250 MCG/5ML IJ SOLN
INTRAMUSCULAR | Status: DC | PRN
  Administered 2017-09-20: 50 ug via INTRAVENOUS

## 2017-09-20 MED ORDER — SODIUM CHLORIDE 0.9 % IJ SOLN
INTRAVENOUS | Status: DC | PRN
  Administered 2017-09-20: 50 mL via INTRAMUSCULAR

## 2017-09-20 MED ORDER — SODIUM CHLORIDE 0.9 % IV SOLN
INTRAVENOUS | Status: AC
  Filled 2017-09-20: qty 1.2

## 2017-09-20 MED ORDER — CEFAZOLIN SODIUM-DEXTROSE 2-3 GM-%(50ML) IV SOLR
INTRAVENOUS | Status: DC | PRN
  Administered 2017-09-20: 2 g via INTRAVENOUS

## 2017-09-20 MED ORDER — DEXTROSE 5 % IV SOLN
INTRAVENOUS | Status: DC | PRN
  Administered 2017-09-20: 25 ug/min via INTRAVENOUS

## 2017-09-20 MED ORDER — FENTANYL CITRATE (PF) 100 MCG/2ML IJ SOLN: 25.0000 ug | INTRAMUSCULAR | Status: DC | PRN

## 2017-09-20 MED ORDER — DEXAMETHASONE SODIUM PHOSPHATE 4 MG/ML IJ SOLN
INTRAMUSCULAR | Status: DC | PRN
  Administered 2017-09-20: 10 mg via INTRAVENOUS

## 2017-09-20 MED ORDER — LACTATED RINGERS IV SOLN: INTRAVENOUS | Status: DC

## 2017-09-20 MED ORDER — ONDANSETRON HCL 4 MG/2ML IJ SOLN: 4.0000 mg | Freq: Once | INTRAMUSCULAR | Status: DC | PRN

## 2017-09-20 MED ORDER — SUCCINYLCHOLINE CHLORIDE 200 MG/10ML IV SOSY
PREFILLED_SYRINGE | INTRAVENOUS | Status: AC
  Filled 2017-09-20: qty 10

## 2017-09-20 MED ORDER — EPHEDRINE SULFATE-NACL 50-0.9 MG/10ML-% IV SOSY
PREFILLED_SYRINGE | INTRAVENOUS | Status: DC | PRN
  Administered 2017-09-20: 5 mg via INTRAVENOUS
  Administered 2017-09-20: 10 mg via INTRAVENOUS

## 2017-09-20 MED ORDER — ONDANSETRON HCL 4 MG/2ML IJ SOLN
INTRAMUSCULAR | Status: DC | PRN
  Administered 2017-09-20: 4 mg via INTRAVENOUS

## 2017-09-20 MED ORDER — LACTATED RINGERS IV SOLN
INTRAVENOUS | Status: DC | PRN
  Administered 2017-09-20: 13:00:00 via INTRAVENOUS

## 2017-09-20 MED ORDER — LIDOCAINE 2% (20 MG/ML) 5 ML SYRINGE
INTRAMUSCULAR | Status: AC
  Filled 2017-09-20: qty 10

## 2017-09-20 MED ORDER — ROCURONIUM BROMIDE 50 MG/5ML IV SOLN
INTRAVENOUS | Status: AC
  Filled 2017-09-20: qty 2

## 2017-09-20 SURGICAL SUPPLY — 86 items
BAG BANDED W/RUBBER/TAPE 36X54 (MISCELLANEOUS) ×5 IMPLANT
BAG SNAP BAND KOVER 36X36 (MISCELLANEOUS) ×5 IMPLANT
BANDAGE ACE 4X5 VEL STRL LF (GAUZE/BANDAGES/DRESSINGS) ×5 IMPLANT
BANDAGE ELASTIC 4 VELCRO ST LF (GAUZE/BANDAGES/DRESSINGS) ×10 IMPLANT
BINDER ABD UNIV 10 28-50 (GAUZE/BANDAGES/DRESSINGS) ×3 IMPLANT
BINDER ABDOM UNIV 10 (GAUZE/BANDAGES/DRESSINGS) ×5
BLADE AVERAGE 25MMX9MM (BLADE) ×1
BLADE AVERAGE 25X9 (BLADE) ×4 IMPLANT
BLADE SAW SGTL 81X20 HD (BLADE) IMPLANT
BLADE SURG 11 STRL SS (BLADE) ×5 IMPLANT
BNDG GAUZE ELAST 4 BULKY (GAUZE/BANDAGES/DRESSINGS) ×30 IMPLANT
CANISTER SUCT 3000ML PPV (MISCELLANEOUS) ×5 IMPLANT
CATH ANGIO 5F BER2 65CM (CATHETERS) ×5 IMPLANT
CATH OMNI FLUSH .035X70CM (CATHETERS) IMPLANT
CATH OMNI FLUSH 5F 65CM (CATHETERS) ×5 IMPLANT
CHLORAPREP W/TINT 26ML (MISCELLANEOUS) IMPLANT
CONNECTOR Y ATS VAC SYSTEM (MISCELLANEOUS) ×5 IMPLANT
COVER DOME SNAP 22 D (MISCELLANEOUS) ×5 IMPLANT
COVER PROBE W GEL 5X96 (DRAPES) ×5 IMPLANT
COVER SURGICAL LIGHT HANDLE (MISCELLANEOUS) ×5 IMPLANT
DERMABOND ADHESIVE PROPEN (GAUZE/BANDAGES/DRESSINGS) ×2
DERMABOND ADVANCED (GAUZE/BANDAGES/DRESSINGS) ×2
DERMABOND ADVANCED .7 DNX12 (GAUZE/BANDAGES/DRESSINGS) ×3 IMPLANT
DERMABOND ADVANCED .7 DNX6 (GAUZE/BANDAGES/DRESSINGS) ×3 IMPLANT
DEVICE CLOSURE PERCLS PRGLD 6F (VASCULAR PRODUCTS) ×3 IMPLANT
DEVICE TORQUE H2O (MISCELLANEOUS) IMPLANT
DRAPE EXTREMITY T 121X128X90 (DRAPE) ×5 IMPLANT
DRAPE FEMORAL ANGIO 80X135IN (DRAPES) ×5 IMPLANT
DRAPE HALF SHEET 40X57 (DRAPES) ×5 IMPLANT
DRAPE INCISE IOBAN 66X45 STRL (DRAPES) ×5 IMPLANT
DRAPE ORTHO SPLIT 77X108 STRL (DRAPES) ×2
DRAPE SURG ORHT 6 SPLT 77X108 (DRAPES) ×3 IMPLANT
DRAPE X-RAY CASS 24X20 (DRAPES) IMPLANT
DRSG PAD ABDOMINAL 8X10 ST (GAUZE/BANDAGES/DRESSINGS) ×5 IMPLANT
DRSG VAC ATS SM SENSATRAC (GAUZE/BANDAGES/DRESSINGS) ×10 IMPLANT
ELECT REM PT RETURN 9FT ADLT (ELECTROSURGICAL) ×5
ELECTRODE REM PT RTRN 9FT ADLT (ELECTROSURGICAL) ×3 IMPLANT
GAUZE SPONGE 4X4 12PLY STRL (GAUZE/BANDAGES/DRESSINGS) ×5 IMPLANT
GAUZE SPONGE 4X4 12PLY STRL LF (GAUZE/BANDAGES/DRESSINGS) ×10 IMPLANT
GAUZE SPONGE 4X4 16PLY XRAY LF (GAUZE/BANDAGES/DRESSINGS) ×5 IMPLANT
GLOVE BIO SURGEON STRL SZ 6 (GLOVE) ×5 IMPLANT
GLOVE BIOGEL PI IND STRL 6.5 (GLOVE) ×6 IMPLANT
GLOVE BIOGEL PI IND STRL 7.0 (GLOVE) ×3 IMPLANT
GLOVE BIOGEL PI IND STRL 7.5 (GLOVE) ×3 IMPLANT
GLOVE BIOGEL PI INDICATOR 6.5 (GLOVE) ×4
GLOVE BIOGEL PI INDICATOR 7.0 (GLOVE) ×2
GLOVE BIOGEL PI INDICATOR 7.5 (GLOVE) ×2
GLOVE SURG SS PI 7.0 STRL IVOR (GLOVE) ×5 IMPLANT
GLOVE SURG SS PI 7.5 STRL IVOR (GLOVE) ×5 IMPLANT
GLOVE SURG SS PI 8.0 STRL IVOR (GLOVE) ×5 IMPLANT
GOWN STRL REUS W/ TWL LRG LVL3 (GOWN DISPOSABLE) ×6 IMPLANT
GOWN STRL REUS W/ TWL XL LVL3 (GOWN DISPOSABLE) ×6 IMPLANT
GOWN STRL REUS W/TWL LRG LVL3 (GOWN DISPOSABLE) ×4
GOWN STRL REUS W/TWL XL LVL3 (GOWN DISPOSABLE) ×4
GUIDEWIRE ANGLED .035X150CM (WIRE) IMPLANT
KIT BASIN OR (CUSTOM PROCEDURE TRAY) ×5 IMPLANT
KIT ENCORE 26 ADVANTAGE (KITS) ×5 IMPLANT
KIT TURNOVER KIT B (KITS) ×5 IMPLANT
NEEDLE HYPO 25GX1X1/2 BEV (NEEDLE) IMPLANT
NEEDLE PERC 18GX7CM (NEEDLE) ×10 IMPLANT
NS IRRIG 1000ML POUR BTL (IV SOLUTION) ×5 IMPLANT
PACK GENERAL/GYN (CUSTOM PROCEDURE TRAY) ×5 IMPLANT
PACK SURGICAL SETUP 50X90 (CUSTOM PROCEDURE TRAY) ×5 IMPLANT
PAD ARMBOARD 7.5X6 YLW CONV (MISCELLANEOUS) ×10 IMPLANT
PAD ELECT DEFIB RADIOL ZOLL (MISCELLANEOUS) ×5 IMPLANT
PERCLOSE PROGLIDE 6F (VASCULAR PRODUCTS) ×5
PROTECTION STATION PRESSURIZED (MISCELLANEOUS) ×5
SET MICROPUNCTURE 5F STIFF (MISCELLANEOUS) ×5 IMPLANT
SHEATH AVANTI 11CM 5FR (SHEATH) ×5 IMPLANT
SHEATH HIGHFLEX ANSEL 6FRX55 (SHEATH) ×5 IMPLANT
STATION PROTECTION PRESSURIZED (MISCELLANEOUS) ×3 IMPLANT
STOPCOCK MORSE 400PSI 3WAY (MISCELLANEOUS) ×5 IMPLANT
SUT ETHILON 3 0 PS 1 (SUTURE) ×10 IMPLANT
SUT VIC AB 3-0 SH 27 (SUTURE) ×4
SUT VIC AB 3-0 SH 27X BRD (SUTURE) ×6 IMPLANT
SYR 10ML LL (SYRINGE) ×25 IMPLANT
SYR 20CC LL (SYRINGE) ×5 IMPLANT
SYR 30ML LL (SYRINGE) ×5 IMPLANT
SYR CONTROL 10ML LL (SYRINGE) IMPLANT
SYR MEDRAD MARK V 150ML (SYRINGE) IMPLANT
TOWEL GREEN STERILE (TOWEL DISPOSABLE) ×10 IMPLANT
TUBING HIGH PRESSURE 120CM (CONNECTOR) ×5 IMPLANT
UNDERPAD 30X30 (UNDERPADS AND DIAPERS) ×5 IMPLANT
WATER STERILE IRR 1000ML POUR (IV SOLUTION) ×5 IMPLANT
WIRE BENTSON .035X145CM (WIRE) ×5 IMPLANT
WIRE G V18X300CM (WIRE) ×5 IMPLANT

## 2017-09-20 NOTE — Op Note (Signed)
Patient name: Mike Wright MRN: [id number] DOB: 1934/02/16 Sex: male  09/20/2017 Pre-operative Diagnosis: Bilateral great toe gangrene  Post-operative diagnosis:  Same Surgeon:  [name] Assistants:  nurse #1: Ultrasound-guided access, left femoral artery Procedure:   #1: Ultrasound-guided access, left femoral artery  #2: Right lower extremity angiogram   #3: Angioplasty, right posterior tibial artery   #4: Amputation of right and left great toe including metatarsal head   Anesthesia: General Blood Loss:  200 Specimens: None  Findings: Excellent bleeding from the amputation site.  Diffusely diseased posterior tibial artery down across the ankle which was treated with angioplasty using a 2.5 mm balloon.  There were multiple areas of greater than 90% stenosis which were resolved after angioplasty to less than 10%.  Indications: The patient has previously undergone percutaneous revascularization of the left leg for bilateral great toe gangrene.  He was originally scheduled to have the right leg done next week however he has had persistent fevers with the most likely source being his toes and therefore toe amputation was recommended.  Prior to doing this he needed to have revascularization of the right leg.  Procedure:  The patient was identified in the holding area and taken to [facility]  The patient was then placed supine on the table. general anesthesia was administered.  The patient was prepped and draped in the usual sterile fashion.  A time out was called and antibiotics were administered.  Ultrasound was used to evaluate the left common femoral artery which was widely patent with calcification.  A digital ultrasound image was acquired.  The left common femoral artery was then cannulated under ultrasound guidance with a micropuncture needle.  An 018 wire was advanced without resistance followed by placement of a micropuncture sheath.  A Bentson wire was then inserted in the aorta  followed by placement of a 5 French sheath.  Using the Omni Flush catheter and a Bentson wire the aortic bifurcation was crossed and the wire was advanced into the superficial femoral artery.  A 6 French 55 cm sheath was then used to replace the 5 [place] sheath and advanced into the right superficial femoral artery.  Because of the patient's heparin allergy, and Angiomax bolus and continuous infusion was initiated.  ACT was confirmed to be approximately 350.  Using a V-18 wire, the posterior tibial artery was selected.  The wire was advanced without significant difficulty into the pedal arch.  I selected a 2.5 mm Sterling balloon and performed balloon angioplasty of the posterior tibial artery beginning below the ankle up to its origin.  Multiple inflations were performed with the balloon taken to nominal pressure and held up for 2 minutes for each inflation.  Once this was done a completion angiogram was performed which showed a widely patent posterior tibial artery as well as the peroneal artery with significantly improved opacification of the digital arteries.  I was satisfied with these results.  The sheath was removed and the arteriotomy site was closed by using a pro-glide.  This was deployed without difficulty and was hemostatic.  Next attention was turned towards both toes.  I made a racquet type incision on both feet at the level of the great toe.  This was done with a 10 blade and was taken down to the bone.  Large bone cutters were used to transect the bone bilaterally.  Stann [place] were used to remove the metatarsal head and part of the tarsal bones.  I was able to  get back to very healthy bone.  No obvious areas of purulence were identified.  There was brisk capillary bleeding throughout the wound bed.  I tried to use cautery on some areas but was reluctant to use cautery significantly as I did not want to affect perfusion of this area.  I felt that the majority of the bleeding was likely because of the  Angiomax that he had been on.  I was originally going to place a wound VAC however I felt that I needed to pack these wounds to help with hemostasis.  Because of the bleeding and the healthy tissue quality, I elected to partially close approximately 50% of the wounds with interrupted 3-0 nylon suture.  At this point sterile dressings were applied.  The patient was then successfully extubated and taken to recovery room in stable condition.  There were no immediate complications.   Disposition: To PACU stable   V. [name], M.D. [facility] [place] [facility] Office: [phone] Pager:  [phone]

## 2017-09-20 NOTE — Consult Note (Signed)
Patient name: NYAIRE DENBLEYKER MRN: [id number] DOB: May 16, 1933 Sex: male   HISTORY OF PRESENT ILLNESS:   Mike Wright is a 84 y.o. male with bilateral necrotic great toes.  He recently underwent left leg revascularization.   CURRENT MEDICATIONS:    Current Facility-Administered Medications  Medication Dose Route Frequency Provider Last Rate Last Dose  . [MAR Hold] 0.9 %  sodium chloride infusion  250 mL Intravenous PRN Fay Records, MD   Stopped at 09/20/17 0309  . 0.9 %  sodium chloride infusion   Intravenous Continuous Serafina Mitchell, MD 100 mL/hr at 09/20/17 0315    . [MAR Hold] acetaminophen (TYLENOL) tablet 650 mg  650 mg Oral Q4H PRN Fay Records, MD   650 mg at 09/16/17 0002  . [MAR Hold] albuterol (PROVENTIL) (2.5 MG/3ML) 0.083% nebulizer solution 3 mL  3 mL Inhalation Q4H PRN Fay Records, MD   3 mL at 09/15/17 2356  . [MAR Hold] amLODipine (NORVASC) tablet 10 mg  10 mg Oral Daily Fay Records, MD   10 mg at 09/19/17 0932  . [MAR Hold] aspirin EC tablet 81 mg  81 mg Oral Daily Fay Records, MD   81 mg at 09/19/17 0932  . ceFAZolin (ANCEF) IVPB 1 g/50 mL premix  1 g Intravenous 30 min Pre-Op Rhyne, Samantha J, PA-C      . [MAR Hold] clopidogrel (PLAVIX) tablet 75 mg  75 mg Oral Daily Fay Records, MD   75 mg at 09/19/17 0931  . [MAR Hold] DAPTOmycin (CUBICIN) 775 mg in sodium chloride 0.9 % IVPB  775 mg Intravenous Q24H Carlyle Basques, MD   Stopped at 09/19/17 2307  . [MAR Hold] insulin aspart (novoLOG) injection 0-9 Units  0-9 Units Subcutaneous Q4H Kathi Ludwig, MD   3 Units at 09/20/17 0057  . [MAR Hold] insulin glargine (LANTUS) injection 15 Units  15 Units Subcutaneous BID Kathi Ludwig, MD   15 Units at 09/19/17 2204  . [MAR Hold] labetalol (NORMODYNE,TRANDATE) injection 10 mg  10 mg Intravenous Q10 min PRN Fay Records, MD      . lactated ringers infusion   Intravenous Continuous Audry Pili, MD 10 mL/hr at  09/20/17 1035    . lactated ringers infusion   Intravenous Continuous Audry Pili, MD      . Doug Sou Hold] lisinopril (PRINIVIL,ZESTRIL) tablet 5 mg  5 mg Oral Daily Fay Records, MD   5 mg at 09/19/17 0932  . [MAR Hold] metoprolol succinate (TOPROL-XL) 24 hr tablet 100 mg  100 mg Oral Daily Fay Records, MD   100 mg at 09/19/17 0931  . [MAR Hold] nitroGLYCERIN (NITROSTAT) SL tablet 0.4 mg  0.4 mg Sublingual Q5 min PRN Fay Records, MD      . Doug Sou Hold] ondansetron [facility]) injection 4 mg  4 mg Intravenous Q6H PRN Fay Records, MD      . Doug Sou Hold] pantoprazole (PROTONIX) EC tablet 40 mg  40 mg Oral Daily Fay Records, MD   40 mg at 09/19/17 0932  . [MAR Hold] piperacillin-tazobactam (ZOSYN) IVPB 3.375 g  3.375 g Intravenous Q8H Fay Records, MD 12.5 mL/hr at 09/20/17 0315    . [MAR Hold] psyllium (HYDROCIL/METAMUCIL) packet 1 packet  1 packet Oral Daily Fay Records, MD   1 packet at 09/19/17 [phone]  . [MAR Hold] senna-docusate (Senokot-S) tablet 1 tablet  1 tablet Oral QHS Fay Records, MD  1 tablet at 09/19/17 2204  . [MAR Hold] sodium chloride flush (NS) 0.9 % injection 10-40 mL  10-40 mL Intracatheter Q12H Elam Dutch, MD   10 mL at 09/19/17 2206  . [MAR Hold] sodium chloride flush (NS) 0.9 % injection 10-40 mL  10-40 mL Intracatheter PRN Elam Dutch, MD      . Doug Sou Hold] sodium chloride flush (NS) 0.9 % injection 3 mL  3 mL Intravenous Q12H Fay Records, MD   3 mL at 09/16/17 2252  . [MAR Hold] sodium chloride flush (NS) 0.9 % injection 3 mL  3 mL Intravenous PRN Fay Records, MD      . Doug Sou Hold] tiotropium [facility]) inhalation capsule 18 mcg  18 mcg Inhalation Daily Fay Records, MD   18 mcg at 09/20/17 0820  . [MAR Hold] traZODone (DESYREL) tablet 100 mg  100 mg Oral QHS Fay Records, MD   100 mg at 09/19/17 2204  . [MAR Hold] vitamin B-12 (CYANOCOBALAMIN) tablet 2,500 mcg  2,500 mcg Oral Daily Fay Records, MD   2,500 mcg at 09/19/17 0768    REVIEW OF SYSTEMS:    [X]  denotes positive finding, [ ]  denotes negative finding Cardiac  Comments:  Chest pain or chest pressure:    Shortness of breath upon exertion:    Short of breath when lying flat:    Irregular heart rhythm:    Constitutional    Fever or chills:      PHYSICAL EXAM:   Vitals:   09/20/17 0513 09/20/17 0820 09/20/17 0900 09/20/17 1012  BP: (!) 149/72     Pulse:   (!) 55   Resp:   (!) 21   Temp: 98.1 F (36.7 C)     TempSrc: Oral     SpO2:  97% 97%   Weight: 205 lb 1.6 oz (93 kg)     Height:    5' 7.01" (1.702 m)    GENERAL: The patient is a well-nourished male, in no acute distress. The vital signs are documented above. CARDIOVASCULAR: There is a regular rate and rhythm. PULMONARY: Non-labored respirations gret toe bilateral ischemia  STUDIES:   none   MEDICAL ISSUES:   paln bilateral great toe amputation and angio +/- PTA of right leg  [name], MD [facility] [phone] Pager [phone]

## 2017-09-20 NOTE — Progress Notes (Signed)
Internal Medicine Attending:   I saw and examined the patient. I reviewed the resident's note and I agree with the resident's findings and plan as documented in the resident's note.  Hospital day #8 with MRSA bacteremia doing well symptomatically. Source of infection is bilateral great to ischemic gangrene. Greatly appreciate surgery doing amputation today to achieve source control. We will see how he does post-operatively. Will be able to stop zosyn post-op, and switch dapto to vancomycin in 1-2 days when renal injury risk wanes.

## 2017-09-20 NOTE — Anesthesia Preprocedure Evaluation (Addendum)
Anesthesia Evaluation  Patient identified by MRN, date of birth, ID band Patient awake    Reviewed: Allergy & Precautions, H&P , NPO status , Patient's Chart, lab work & pertinent test results, reviewed documented beta blocker date and time   Airway Mallampati: II  TM Distance: >3 FB Neck ROM: Full    Dental  (+) Upper Dentures, Dental Advisory Given   Pulmonary sleep apnea , COPD,  COPD inhaler, former smoker,    Pulmonary exam normal breath sounds clear to auscultation       Cardiovascular hypertension, Pt. on medications and Pt. on home beta blockers + angina + CAD, + Cardiac Stents, + Peripheral Vascular Disease and +CHF  + dysrhythmias Atrial Fibrillation + Valvular Problems/Murmurs AS  Rhythm:Regular Rate:Normal + Systolic murmurs '19 TTE - Moderate LVH. EF 55% to 60%. Mild AS, mild AI. Mild MR. PASP mildly increased. PA peak pressure: 44 mm Hg  '19 Cath - Dist RCA lesion is 40% stenosed. Prox RCA lesion is 25% stenosed. 1st Diag lesion is 75% stenosed. A drug-eluting stent was successfully placed using a STENT SYNERGY DES 2.25X12. Post intervention, there is a 0% residual stenosis. Prox LAD to Mid LAD lesion is 75% stenosed. A drug-eluting stent was successfully placed using a STENT SYNERGY DES 3.5X38. Post intervention, there is a 0% residual stenosis. Prox LAD lesion is 70% stenosed. A drug-eluting stent was successfully placed using a STENT SYNERGY DES 4X12, overlapping the prior stent. Post intervention, there is a 0% residual stenosis. The left ventricular systolic function is normal. The left ventricular ejection fraction is 55-65% by visual estimate. LV end diastolic pressure is normal. There is no aortic valve stenosis. Ao sat 98%, PA sat 65%; PA mean 25 mm Hg; unable to wedge the catheter. Normal right heat pressures.   Neuro/Psych negative neurological ROS  negative psych ROS    GI/Hepatic Neg liver ROS, hiatal hernia, GERD  Medicated and Controlled,  Endo/Other  diabetes, Type 2, Insulin Dependent, Oral Hypoglycemic Agents  Renal/GU Renal InsufficiencyRenal disease  negative genitourinary   Musculoskeletal  (+) Arthritis , Osteoarthritis,    Abdominal   Peds  Hematology  (+) anemia ,   Anesthesia Other Findings   Reproductive/Obstetrics negative OB ROS                           Anesthesia Physical  Anesthesia Plan  ASA: IV  Anesthesia Plan: General   Post-op Pain Management:    Induction: Intravenous  PONV Risk Score and Plan: 2 and Treatment may vary due to age or medical condition, Dexamethasone and Ondansetron  Airway Management Planned: LMA  Additional Equipment: Arterial line  Intra-op Plan:   Post-operative Plan: Extubation in OR  Informed Consent: I have reviewed the patients History and Physical, chart, labs and discussed the procedure including the risks, benefits and alternatives for the proposed anesthesia with the patient or authorized representative who has indicated his/her understanding and acceptance.   Dental advisory given  Plan Discussed with: CRNA and Anesthesiologist  Anesthesia Plan Comments:       Anesthesia Quick Evaluation

## 2017-09-20 NOTE — Progress Notes (Signed)
Report called to OR and given to Manuela Schwartz, Therapist, sports. Telemetry removed at this time. Patient down to OR at this time.   Emelda Fear, RN

## 2017-09-20 NOTE — Anesthesia Procedure Notes (Signed)
Arterial Line Insertion Start/End6/26/2019 12:56 PM, 09/20/2017 1:00 PM Performed by: Glynda Jaeger, CRNA, CRNA  Patient location: OR. Preanesthetic checklist: patient identified, IV checked, site marked, risks and benefits discussed, surgical consent, monitors and equipment checked, pre-op evaluation, timeout performed and anesthesia consent Left, radial was placed Catheter size: 20 G Hand hygiene performed  and maximum sterile barriers used   Attempts: 1 Procedure performed without using ultrasound guided technique. Following insertion, dressing applied and Biopatch. Post procedure assessment: normal  Patient tolerated the procedure well with no immediate complications.

## 2017-09-20 NOTE — Progress Notes (Signed)
Subjective: The patient was resting in his bed today upon entering the room. He denied dyspnea, orthopnea, chest pain/pressure, or leg pain. He is agreement with undergoing surgical removal of his bilateral great toes and revascularization of his right lower extremity today. The patient was advised that the PICC line would need to stay in place for two weeks while he received IV antibiotics for his MRSA bacteremia.   Objective:  Vital signs in last 24 hours: Vitals:   09/19/17 1636 09/19/17 2049 09/20/17 0000 09/20/17 0513  BP: (!) 154/72 138/79 (!) 114/55 (!) 149/72  Pulse: 73  72   Resp: (!) 24  (!) 22   Temp: 97.7 F (36.5 C) 98.8 F (37.1 C)  98.1 F (36.7 C)  TempSrc: Oral Oral  Oral  SpO2: 97%  93%   Weight:    205 lb 1.6 oz (93 kg)  Height:       Physical Exam  Constitutional: He appears well-developed and well-nourished. No distress.  Cardiovascular: Normal rate and regular rhythm.  Murmur heard. Pulmonary/Chest: Effort normal and breath sounds normal. No respiratory distress.  Abdominal: Soft. Bowel sounds are normal. He exhibits no distension.  Musculoskeletal: He exhibits deformity (Persistent Bilateral gangrenous great toes). He exhibits no edema.  Neurological: He is alert.  Skin: Skin is warm. He is not diaphoretic.  Psychiatric: He has a normal mood and affect.  Vitals reviewed.  Assessment/Plan:  Principal Problem:   MRSA bacteremia Active Problems:   Diabetes ([place])   Chronic diastolic CHF (congestive heart failure) ([place])   Gangrene of bilateral great toes ([facility])   Urinary retention  Assessment:  Mike Wright is an 84yo Mwith PMHx notable forCAD ( s/p stenting x2 to LAD and x1 to diag in February 2019), atrial fibrillation with paroxysmal supraventricular tachycardia, type 2 diabetes ( on insulin) who presents for evaluation of pain and blackening of his large toes on both feet. The presentationwas concerning for severe distal PAD with secondary dry  gangrene with concern for secondary infection given that the skin barrier was broken multiple times in a non-sterile environment. Hewasadmitted for evaluation and inpatient intervention where he underwent PCI of the distal lower extremities as well as bilateral amputation of the great toes.   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 Their plan is to have the patient return in one week for intervention of the right posterior tibial artery and possibly peroneal arteries.  --Vascular surgeries Dr. Trula Slade with Dr. Donnetta Hutching, to perform bilateral amputation as well as right-sided revascularization on 09/20/2017 as per their notes.  We appreciate their continued assistance with this patient and agree with this plan. --Continue Zosyn as per IDfor gram negative and anaerobic coverage until post op  MRSA: Observed on Culture routinely taken at admission.Repeat Bl Cx w/ gram positive cocci on 09/15/2017.  CBC on 6/25 leukocytosis initiating stable but mildly worsening anemia. --TTE w/o vegetation commented on --TEEcompleted w/o vegetation on 09/18/2017 --Daptomycin 8mg /kg x 96.8kg06/20 >>... As per IDs recommendation would count 09/18/2017 as day 1 if his second set of cultures return negative to complete a 14-day course of daptomycin/vancomycin from that date. --PICC line placed by IV team --Repeat Cx's on 6/23 negative to date --Repeat Cx's on 6/25 pending --Daily CBC's  Pulmonary Edema: Resolved. Denied orthopnea. Comfortable on room air. Continue to monitor. --Continue strict I/O's and daily weights --Repeat Lasix PRN for  edema/dyspnea  P-A-fib: On Eliquis at home.  Eliquis held due to pending procedure  Type 2 DM: A1c7.1%. On lantus 40 mg qAM 30 mg qPM at home. --Lantus adjusted for surgery --Q4  Hour aspart  initiated on a sliding scale to cover serum glucose elevations while NPO --Lantus to be continued at30U as patient is not consuming large portions of his meals the day following surgery --CBG with meals and QHS --BMP daily  CAD: S/P LHC in 04/2017 with drug eluting stents placed. Will need to remain on Plavix. Denied ongoing chest pain. No acute issues. --Continued plavix 75mg  daily --Continue home atorvastatin  --Continue daily ASA 81mg   COPD: No acute issues. --Continue Spiriva --Continue albuterol PRN  HTN: We will continue his home Amlodipine 10mg  daily, and Lisinopril 5mg  dailyand make the recommendation that they consider increasing the lisinopril at his outpatient visits his blood pressure continues to be elevated.   Diet:Heart Healthy Code: Full Fluids: n/a GI PPX: Home Protonix 40mg  daily VTE [id number] Dispo: Anticipated discharge in approximately 0 day(s).   Mike Ludwig, MD 09/20/2017, 6:35 AM Pager: Pager# [phone]

## 2017-09-20 NOTE — Transfer of Care (Signed)
Immediate Anesthesia Transfer of Care Note  Patient: Mike Wright  Procedure(s) Performed: RIGHT LOWER LEG  ANGIOGRAM (Right ) BILATERAL GREAT TOE AMPUTATIONS INCLUDING METATARSAL HEADS (Bilateral Foot) ANGIOPLASTY RIGHT TIBIAL ARTERY (Right Leg Lower)  Patient Location: PACU  Anesthesia Type:General  Level of Consciousness: awake, alert  and oriented  Airway & Oxygen Therapy: Patient Spontanous Breathing and Patient connected to face mask oxygen  Post-op Assessment: Report given to RN and Post -op Vital signs reviewed and stable  Post vital signs: Reviewed and stable  Last Vitals:  Vitals Value Taken Time  BP 141/101 09/20/2017  3:31 PM  Temp    Pulse 76 09/20/2017  3:35 PM  Resp 21 09/20/2017  3:35 PM  SpO2 100 % 09/20/2017  3:35 PM  Vitals shown include unvalidated device data.  Last Pain:  Vitals:   09/20/17 0817  TempSrc:   PainSc: 0-No pain         Complications: No apparent anesthesia complications

## 2017-09-20 NOTE — Progress Notes (Signed)
PT Cancellation Note  Patient Details Name: MATTY VANROEKEL MRN: [id number] DOB: September 21, 1933   Cancelled Treatment:    Reason Eval/Treat Not Completed: Patient at procedure or test/unavailable(Pt in surgery.  Will return tomorrow.  Thanks. )   Godfrey Pick Jermall Isaacson 09/20/2017, 10:30 AM  [facility] [phone] (pager)

## 2017-09-20 NOTE — Anesthesia Procedure Notes (Signed)
Procedure Name: Intubation Date/Time: 09/20/2017 12:59 PM Performed by: Audry Pili, MD Pre-anesthesia Checklist: Patient identified, Emergency Drugs available, Suction available and Patient being monitored Patient Re-evaluated:Patient Re-evaluated prior to induction Oxygen Delivery Method: Circle system utilized Preoxygenation: Pre-oxygenation with 100% oxygen Induction Type: IV induction Ventilation: Mask ventilation without difficulty Laryngoscope Size: Miller and 2 Grade View: Grade I Tube type: Oral Tube size: 7.5 mm Number of attempts: 1 Airway Equipment and Method: Stylet and Oral airway Placement Confirmation: ETT inserted through vocal cords under direct vision,  positive ETCO2 and breath sounds checked- equal and bilateral Secured at: 21 cm Tube secured with: Tape Dental Injury: Teeth and Oropharynx as per pre-operative assessment  Comments: Initially placed LMA 4 with good seal. However, patient was moving significant amount despite several minutes under general anesthetic. Decision made to exchange LMA for ETT to better secure the airway in prep for muscle relaxation.

## 2017-09-21 ENCOUNTER — Encounter (HOSPITAL_COMMUNITY): Payer: Self-pay | Admitting: Surgery

## 2017-09-21 ENCOUNTER — Telehealth: Payer: Self-pay | Admitting: Surgery

## 2017-09-21 LAB — BASIC METABOLIC PANEL
ANION GAP: 11 (ref 5–15)
BUN: 13 mg/dL (ref 8–23)
CHLORIDE: 101 mmol/L (ref 98–111)
CO2: 23 mmol/L (ref 22–32)
Calcium: 8.3 mg/dL — ABNORMAL LOW (ref 8.9–10.3)
Creatinine, Ser: 0.9 mg/dL (ref 0.61–1.24)
GFR calc non Af Amer: >60 mL/min (ref >60)
Glucose, Bld: 233 mg/dL — ABNORMAL HIGH (ref 70–99)
Potassium: 4.2 mmol/L (ref 3.5–5.1)
Sodium: 135 mmol/L (ref 135–145)

## 2017-09-21 LAB — GLUCOSE, CAPILLARY
GLUCOSE-CAPILLARY: 211 mg/dL — AB (ref 70–99)
GLUCOSE-CAPILLARY: 233 mg/dL — AB (ref 70–99)
GLUCOSE-CAPILLARY: 78 mg/dL (ref 70–99)
Glucose-Capillary: 203 mg/dL — ABNORMAL HIGH (ref 70–99)
Glucose-Capillary: 269 mg/dL — ABNORMAL HIGH (ref 70–99)
Glucose-Capillary: 286 mg/dL — ABNORMAL HIGH (ref 70–99)
Glucose-Capillary: 299 mg/dL — ABNORMAL HIGH (ref 70–99)

## 2017-09-21 LAB — CBC
HCT: 25.5 % — ABNORMAL LOW (ref 39.0–52.0)
Hemoglobin: 7.9 g/dL — ABNORMAL LOW (ref 13.0–17.0)
MCH: 25.2 pg — ABNORMAL LOW (ref 26.0–34.0)
MCHC: 31 g/dL (ref 30.0–36.0)
MCV: 81.5 fL (ref 78.0–100.0)
Platelets: 267 10*3/uL (ref 150–400)
RBC: 3.13 MIL/uL — ABNORMAL LOW (ref 4.22–5.81)
RDW: 16.9 % — ABNORMAL HIGH (ref 11.5–15.5)
WBC: 9.4 10*3/uL (ref 4.0–10.5)

## 2017-09-21 MED ORDER — VANCOMYCIN HCL 10 G IV SOLR
1750.0000 mg | Freq: Once | INTRAVENOUS | Status: DC
  Filled 2017-09-21: qty 1750

## 2017-09-21 MED ORDER — INSULIN ASPART 100 UNIT/ML ~~LOC~~ SOLN
0.0000 [IU] | SUBCUTANEOUS | Status: DC
  Administered 2017-09-21: 4 [IU] via SUBCUTANEOUS
  Administered 2017-09-21: 6 [IU] via SUBCUTANEOUS
  Administered 2017-09-21: 4 [IU] via SUBCUTANEOUS
  Administered 2017-09-22: 2 [IU] via SUBCUTANEOUS
  Administered 2017-09-22: 4 [IU] via SUBCUTANEOUS
  Administered 2017-09-22 – 2017-09-23 (×2): 2 [IU] via SUBCUTANEOUS
  Administered 2017-09-23: 4 [IU] via SUBCUTANEOUS
  Administered 2017-09-23: 2 [IU] via SUBCUTANEOUS
  Administered 2017-09-23 (×2): 4 [IU] via SUBCUTANEOUS
  Administered 2017-09-24: 6 [IU] via SUBCUTANEOUS
  Administered 2017-09-24: 2 [IU] via SUBCUTANEOUS
  Administered 2017-09-24 (×2): 4 [IU] via SUBCUTANEOUS
  Administered 2017-09-24: 0 [IU] via SUBCUTANEOUS
  Administered 2017-09-25: 4 [IU] via SUBCUTANEOUS

## 2017-09-21 MED ORDER — VANCOMYCIN HCL 10 G IV SOLR
1750.0000 mg | Freq: Once | INTRAVENOUS | Status: AC
  Administered 2017-09-21: 1750 mg via INTRAVENOUS
  Filled 2017-09-21: qty 1750

## 2017-09-21 MED ORDER — VANCOMYCIN HCL 10 G IV SOLR
1250.0000 mg | Freq: Two times a day (BID) | INTRAVENOUS | Status: DC
  Administered 2017-09-22 – 2017-09-24 (×5): 1250 mg via INTRAVENOUS
  Filled 2017-09-21 (×6): qty 1250

## 2017-09-21 MED ORDER — INSULIN GLARGINE 100 UNIT/ML ~~LOC~~ SOLN
30.0000 [IU] | Freq: Two times a day (BID) | SUBCUTANEOUS | Status: DC
  Administered 2017-09-21 – 2017-09-25 (×9): 30 [IU] via SUBCUTANEOUS
  Filled 2017-09-21 (×10): qty 0.3

## 2017-09-21 MED ORDER — VANCOMYCIN HCL 10 G IV SOLR: 1250.0000 mg | Freq: Two times a day (BID) | INTRAVENOUS | Status: DC

## 2017-09-21 NOTE — Progress Notes (Signed)
Internal Medicine Attending:   I saw and examined the patient. I reviewed the resident's note and I agree with the resident's findings and plan as documented in the resident's note.  Hospital day 9 with MRSA bacteremia, doing well now on vancomycin.  Greatly appreciate infectious disease and vascular surgery consultations.  Patient tolerated bilateral great toe amputations well yesterday.  Pain is controlled today.  He is going to start working with PT to get mobilized.  He uses Plavix for coronary artery disease with a drug-eluting stent placed in February, we will also need to restart Eliquis for his atrial fibrillation.  Will coordinate best timing for anticoagulation with surgery based on wound bleeding risk.

## 2017-09-21 NOTE — Progress Notes (Signed)
Pharmacy Antibiotic Note  Mike Wright is a 84 y.o. male admitted on 09/13/2017 with bacteremia.  Pharmacy has been consulted for vancomycin dosing. Patient hospital day 9 with MRSA bacteremia secondary to bilateral great toe ischemic gangrene. S/p amputation surgery 6/26. Renal function improving. Switching daptomycin to vancomycin.  Plan: Vancomycin 1750 mg loading dose x 1 Vancomycin 1250 mg IV every 12 hours. Goal trough 15-20. Continue for two weeks from negative culture (stop date: 7/5) Monitor renal function and vanc trough as neded.  Height: 5' 7.01" (170.2 cm) Weight: 209 lb 6.4 oz (95 kg) IBW/kg (Calculated) : 66.12  Temp (24hrs), Avg:97.7 F (36.5 C), Min:97.5 F (36.4 C), Max:98.3 F (36.8 C)  Recent Labs  Lab 09/16/17 0709 09/17/17 0913 09/18/17 0340 09/19/17 0028 09/20/17 0529 09/21/17 0340 09/21/17 0833  WBC 15.5* 11.5*  --  9.9 9.1 9.4  --   CREATININE 0.79 0.84 0.82  --  0.81  --  0.90    Estimated Creatinine Clearance: 70.7 mL/min (by C-G formula based on SCr of 0.9 mg/dL).    Allergies  Allergen Reactions  . Codeine Shortness Of Breath  . Heparin Other (See Comments)    +HIT,  Severe bleeding   . Losartan Swelling    Per ENT UNSPECIFIED REACTION   . Other Other (See Comments)    Severe bleeding UNSPECIFIED AGENT   . Oxycodone Other (See Comments)    "Made me act out of my mind" Other reaction(s): Other (See Comments) Mental status changes hallucinations    Thank you for allowing pharmacy to be a part of this patient's care.  Mike Wright 09/21/2017 10:52 AM

## 2017-09-21 NOTE — Clinical Social Work Note (Signed)
Clinical Social Work Assessment  Patient Details  Name: Mike Wright MRN: [id number] Date of Birth: [birth year]  Date of referral:  09/21/17               Reason for consult:  Facility Placement                Permission sought to share information with:  Chartered certified accountant granted to share information::  Yes, Verbal Permission Granted  Name::     Sport and exercise psychologist::  SNF  Relationship::  wife  Contact Information:     Housing/Transportation Living arrangements for the past 2 months:  Single Family Home Source of Information:  Patient Patient Interpreter Needed:  None Criminal Activity/Legal Involvement Pertinent to Current Situation/Hospitalization:  No - Comment as needed Significant Relationships:  Spouse Lives with:  Spouse Do you feel safe going back to the place where you live?  No Need for family participation in patient care:  No (Coment)  Care giving concerns:  Pt lives at home with spouse- concerned about having enough support at home given pt reduced mobility and need for IV antibiotics.   Social Worker assessment / plan:  CSW met with pt and wife to discuss PT recommendation for SNF.  Explained SNF and SNF referral process.  Pt reports having been to SNF before after a knee surgery and understands the process.  Employment status:  Retired Nurse, adult PT Recommendations:  [place] / Referral to community resources:  [place]  Patient/Family's Response to care:  Pt and wife in agreement with SNF- wife acknowledges that his needs would be too high for her to manage safely at home.  Patient/Family's Understanding of and Emotional Response to Diagnosis, Current Treatment, and Prognosis:  Pt and wife seem to have good understanding of current medical condition at this time- hopeful that pt will only need a week or two of rehab then be able to return home.  Emotional  Assessment Appearance:  Appears stated age Attitude/Demeanor/Rapport:    Affect (typically observed):  Appropriate, Pleasant Orientation:  Oriented to Self, Oriented to Place, Oriented to  Time, Oriented to Situation Alcohol / Substance use:  Not Applicable Psych involvement (Current and /or in the community):  No (Comment)  Discharge Needs  Concerns to be addressed:  Care Coordination Readmission within the last 30 days:  No Current discharge risk:  Physical Impairment Barriers to Discharge:  Continued Medical Work up   Jorge Ny, LCSW 09/21/2017, 4:30 PM

## 2017-09-21 NOTE — Progress Notes (Signed)
Subjective  - POD #1, s/p right PT angioplasty and bilateral great toe amputations  No pain this am   Physical Exam:  dressigngs changed Tissue looks healthy Brisk doppler PT       Assessment/Plan:  POD #1  Continue wet to dry dressing changes, will consider changing to wound vac if no further bleeding Continue Plavix  Post op shoe Non-weight bearing on both great toe areas, can bear weight on heel and mid foot  [place] Margit Batte 09/21/2017 9:32 AM --  Vitals:   09/21/17 0713 09/21/17 0856  BP:  (!) 149/70  Pulse:    Resp:  (!) 23  Temp:  (!) 97.5 F (36.4 C)  SpO2: 95%     Intake/Output Summary (Last 24 hours) at 09/21/2017 0932 Last data filed at 09/21/2017 0415 Gross per 24 hour  Intake 1901.97 ml  Output 1000 ml  Net 901.97 ml     Laboratory CBC    Component Value Date/Time   WBC 9.4 09/21/2017 0340   HGB 7.9 (L) 09/21/2017 0340   HGB 12.3 (L) 05/30/2017 1531   HCT 25.5 (L) 09/21/2017 0340   HCT 37.7 05/30/2017 1531   PLT 267 09/21/2017 0340   PLT 212 05/30/2017 1531    BMET    Component Value Date/Time   NA 135 09/21/2017 0833   NA 139 05/30/2017 1531   K 4.2 09/21/2017 0833   CL 101 09/21/2017 0833   CO2 23 09/21/2017 0833   GLUCOSE 233 (H) 09/21/2017 0833   BUN 13 09/21/2017 0833   BUN 13 05/30/2017 1531   CREATININE 0.90 09/21/2017 0833   CALCIUM 8.3 (L) 09/21/2017 0833   GFRNONAA >60 09/21/2017 0833   GFRAA >60 09/21/2017 0833    COAG Lab Results  Component Value Date   INR 1.20 09/20/2017   INR 1.54 09/18/2017   INR 1.32 09/15/2017   No results found for: PTT  Antibiotics Anti-infectives (From admission, onward)   Start     Dose/Rate Route Frequency Ordered Stop   09/20/17 1000  ceFAZolin (ANCEF) IVPB 1 g/50 mL premix  Status:  Discontinued    Note to Pharmacy:  Send with pt to OR   1 g 100 mL/hr over 30 Minutes Intravenous 30 min pre-op 09/19/17 1056 09/20/17 1642   09/18/17 1800  DAPTOmycin (CUBICIN) 775 mg in  sodium chloride 0.9 % IVPB     775 mg 231 mL/hr over 30 Minutes Intravenous Every 24 hours 09/18/17 1730 09/29/17 2359   09/15/17 1800  DAPTOmycin (CUBICIN) 775 mg in sodium chloride 0.9 % IVPB  Status:  Discontinued     775 mg 231 mL/hr over 30 Minutes Intravenous Every 24 hours 09/15/17 1700 09/18/17 1730   09/15/17 1800  piperacillin-tazobactam (ZOSYN) IVPB 3.375 g  Status:  Discontinued     3.375 g 12.5 mL/hr over 240 Minutes Intravenous Every 8 hours 09/15/17 1700 09/21/17 0712   09/15/17 0200  vancomycin (VANCOCIN) 1,250 mg in sodium chloride 0.9 % 250 mL IVPB  Status:  Discontinued     1,250 mg 166.7 mL/hr over 90 Minutes Intravenous Every 12 hours 09/14/17 1237 09/14/17 1451   09/14/17 1500  DAPTOmycin (CUBICIN) 774.5 mg in sodium chloride 0.9 % IVPB  Status:  Discontinued     8 mg/kg  96.8 kg 231 mL/hr over 30 Minutes Intravenous Every 24 hours 09/14/17 1452 09/15/17 1525   09/14/17 1245  vancomycin (VANCOCIN) 2,000 mg in sodium chloride 0.9 % 500 mL IVPB  Status:  Discontinued  2,000 mg 250 mL/hr over 120 Minutes Intravenous  Once 09/14/17 1237 09/14/17 1501   09/13/17 1730  cefTRIAXone (ROCEPHIN) 2 g in sodium chloride 0.9 % 100 mL IVPB  Status:  Discontinued     2 g 200 mL/hr over 30 Minutes Intravenous Every 24 hours 09/13/17 1649 09/14/17 0731       V. Leia Alf, M.D. [facility] Office: [phone] Pager:  [phone]

## 2017-09-21 NOTE — Anesthesia Postprocedure Evaluation (Signed)
Anesthesia Post Note  Patient: JOSIAH NIETO  Procedure(s) Performed: RIGHT LOWER LEG  ANGIOGRAM (Right ) BILATERAL GREAT TOE AMPUTATIONS INCLUDING METATARSAL HEADS (Bilateral Foot) ANGIOPLASTY RIGHT TIBIAL ARTERY (Right Leg Lower)     Patient location during evaluation: PACU Anesthesia Type: General Level of consciousness: awake and alert Pain management: pain level controlled Vital Signs Assessment: post-procedure vital signs reviewed and stable Respiratory status: spontaneous breathing, nonlabored ventilation, respiratory function stable and patient connected to nasal cannula oxygen Cardiovascular status: blood pressure returned to baseline and stable Postop Assessment: no apparent nausea or vomiting Anesthetic complications: no    Last Vitals:  Vitals:   09/21/17 0342 09/21/17 0713  BP: 135/69   Pulse: 84   Resp:    Temp: 36.5 C   SpO2:  95%    Last Pain:  Vitals:   09/21/17 0342  TempSrc: Oral  PainSc:                  Audry Pili

## 2017-09-21 NOTE — NC FL2 (Signed)
[place] LEVEL OF CARE SCREENING TOOL     IDENTIFICATION  Patient Name: Mike Wright Birthdate: 07-17-1933 Sex: male Admission Date (Current Location): 09/13/2017  [facility] and [place] Number:  [employer] and Address:  [facility] [place]. [facility], [place] [address] [place], [place] [address]      Provider Number: [id number]  Attending Physician Name and Address:  Axel Filler, *  Relative Name and Phone Number:       Current Level of Care: Hospital Recommended Level of Care: [place] Prior Approval Number:    Date Approved/Denied:   PASRR Number: [id number]  Discharge Plan: SNF    Current Diagnoses: Patient Active Problem List   Diagnosis Date Noted  . Urinary retention 09/16/2017  . MRSA bacteremia 09/15/2017  . Gangrene of bilateral great toes ([place]) 09/13/2017  . COPD (chronic obstructive pulmonary disease) ([place]) 05/19/2017  . Anemia   . CAD (coronary artery disease)   . Chronic diastolic CHF (congestive heart failure) ([place])   . PSVT (paroxysmal supraventricular tachycardia) ([place])   . PAF (paroxysmal atrial fibrillation) ([place])   . Sleep apnea   . Type 2 diabetes mellitus ([place])   . Status post coronary artery stent placement   . Essential hypertension 04/15/2010  . Diabetes ([place]) 04/14/2010    Orientation RESPIRATION BLADDER Height & Weight        Normal Incontinent Weight: 209 lb 6.4 oz (95 kg) Height:  5' 7.01" (170.2 cm)  BEHAVIORAL SYMPTOMS/MOOD NEUROLOGICAL BOWEL NUTRITION STATUS      Continent Diet(cardiac)  AMBULATORY STATUS COMMUNICATION OF NEEDS Skin   Extensive Assist Verbally Surgical wounds, Other (Comment)(might need wound vac at DC)                       Personal Care Assistance Level of Assistance  Bathing, Dressing Bathing Assistance: Limited assistance   Dressing Assistance: Limited assistance     Functional Limitations Info             [place]  PT (By licensed PT), OT (By licensed OT)     PT Frequency: 5/wk OT Frequency: 5/wk            Contractures      Additional Factors Info  Code Status, Allergies, Insulin Sliding Scale, Isolation Precautions Code Status Info: FULL Allergies Info: Codeine, Heparin, Losartan, Other, Oxycodone   Insulin Sliding Scale Info: 8/day Isolation Precautions Info: MRSA     Current Medications (09/21/2017):  This is the current hospital active medication list Current Facility-Administered Medications  Medication Dose Route Frequency Provider Last Rate Last Dose  . 0.9 %  sodium chloride infusion  250 mL Intravenous PRN Fay Records, MD   Stopped at 09/21/17 [phone]  . acetaminophen (TYLENOL) tablet 650 mg  650 mg Oral Q4H PRN Fay Records, MD   650 mg at 09/16/17 0002  . albuterol (PROVENTIL) (2.5 MG/3ML) 0.083% nebulizer solution 3 mL  3 mL Inhalation Q4H PRN Fay Records, MD   3 mL at 09/15/17 2356  . amLODipine (NORVASC) tablet 10 mg  10 mg Oral Daily Fay Records, MD   10 mg at 09/21/17 0900  . clopidogrel (PLAVIX) tablet 75 mg  75 mg Oral Daily Fay Records, MD   75 mg at 09/21/17 0901  . fentaNYL (SUBLIMAZE) injection 12.5 mcg  12.5 mcg Intravenous Q2H PRN Ina Homes, MD   12.5 mcg at 09/21/17 0550  .  insulin aspart (novoLOG) injection 0-24 Units  0-24 Units Subcutaneous Q4H Kathi Ludwig, MD   6 Units at 09/21/17 1236  . insulin glargine (LANTUS) injection 30 Units  30 Units Subcutaneous BID Kathi Ludwig, MD   30 Units at 09/21/17 [phone]  . labetalol (NORMODYNE,TRANDATE) injection 10 mg  10 mg Intravenous Q10 min PRN Fay Records, MD      . lactated ringers infusion   Intravenous Continuous Audry Pili, MD   Stopped at 09/20/17 1535  . lactated ringers infusion   Intravenous Continuous Audry Pili, MD      . lisinopril (PRINIVIL,ZESTRIL) tablet 5 mg  5 mg Oral Daily Fay Records, MD   5 mg at 09/21/17 0901  . metoprolol succinate (TOPROL-XL) 24 hr  tablet 100 mg  100 mg Oral Daily Fay Records, MD   100 mg at 09/21/17 0900  . nitroGLYCERIN (NITROSTAT) SL tablet 0.4 mg  0.4 mg Sublingual Q5 min PRN Fay Records, MD      . ondansetron [facility]) injection 4 mg  4 mg Intravenous Q6H PRN Fay Records, MD      . pantoprazole (PROTONIX) EC tablet 40 mg  40 mg Oral Daily Fay Records, MD   40 mg at 09/21/17 0901  . psyllium (HYDROCIL/METAMUCIL) packet 1 packet  1 packet Oral Daily Fay Records, MD   1 packet at 09/21/17 0900  . senna-docusate (Senokot-S) tablet 1 tablet  1 tablet Oral QHS Fay Records, MD   1 tablet at 09/20/17 2214  . sodium chloride flush (NS) 0.9 % injection 10-40 mL  10-40 mL Intracatheter Q12H Elam Dutch, MD   10 mL at 09/21/17 0905  . sodium chloride flush (NS) 0.9 % injection 10-40 mL  10-40 mL Intracatheter PRN Elam Dutch, MD      . sodium chloride flush (NS) 0.9 % injection 3 mL  3 mL Intravenous Q12H Fay Records, MD   3 mL at 09/21/17 0904  . sodium chloride flush (NS) 0.9 % injection 3 mL  3 mL Intravenous PRN Fay Records, MD      . tiotropium [facility]) inhalation capsule 18 mcg  18 mcg Inhalation Daily Fay Records, MD   18 mcg at 09/21/17 [phone]  . traZODone (DESYREL) tablet 100 mg  100 mg Oral QHS Fay Records, MD   100 mg at 09/20/17 2214  . [START ON 09/22/2017] vancomycin (VANCOCIN) 1,250 mg in sodium chloride 0.9 % 250 mL IVPB  1,250 mg Intravenous Q12H Baggett, Brooke A, RPH      . [START ON 09/22/2017] vancomycin (VANCOCIN) 1,750 mg in sodium chloride 0.9 % 500 mL IVPB  1,750 mg Intravenous Once Baggett, Brooke A, RPH      . vitamin B-12 (CYANOCOBALAMIN) tablet 2,500 mcg  2,500 mcg Oral Daily Fay Records, MD   2,500 mcg at 09/21/17 0901     Discharge Medications: Please see discharge summary for a list of discharge medications.  Relevant Imaging Results:  Relevant Lab Results:   Additional Information SS#: [id number]; will need IV vancomycin though 09/29/17  Jorge Ny,  LCSW

## 2017-09-21 NOTE — Progress Notes (Addendum)
[name] for Infectious Disease    Date of Admission:  09/13/2017   Total days of antibiotics 9          ID: Mike Wright is a 84 y.o. male with mrsa bacteremia in the setting of bilateral grangrenous great toes. Principal Problem:   MRSA bacteremia Active Problems:   Diabetes ([place])   Chronic diastolic CHF (congestive heart failure) (H[facility])   Gangrene of bilateral great toes (H[facility])   Urinary retention    Subjective: Underwent amputation and revascularization yesterday  Medications:  . amLODipine  10 mg Oral Daily  . aspirin EC  81 mg Oral Daily  . clopidogrel  75 mg Oral Daily  . insulin glargine  30 Units Subcutaneous BID  . lisinopril  5 mg Oral Daily  . metoprolol succinate  100 mg Oral Daily  . pantoprazole  40 mg Oral Daily  . psyllium  1 packet Oral Daily  . senna-docusate  1 tablet Oral QHS  . sodium chloride flush  10-40 mL Intracatheter Q12H  . sodium chloride flush  3 mL Intravenous Q12H  . tiotropium  18 mcg Inhalation Daily  . traZODone  100 mg Oral QHS  . vitamin B-12  2,500 mcg Oral Daily    Objective: Vital signs in last 24 hours: Temp:  [97.5 F (36.4 C)-98.3 F (36.8 C)] 97.5 F (36.4 C) (06/27 0856) Pulse Rate:  [73-84] 84 (06/27 0342) Resp:  [16-24] 23 (06/27 0856) BP: (134-149)/(69-91) 149/70 (06/27 0856) SpO2:  [90 %-100 %] 95 % (06/27 0713) Arterial Line BP: (169-183)/(62-85) 173/62 (06/27 0856) Weight:  [209 lb 6.4 oz (95 kg)] 209 lb 6.4 oz (95 kg) (06/27 0342) Physical Exam  Constitutional: He is oriented to person, place, and time. He appears well-developed and well-nourished. No distress.  HENT:  Mouth/Throat: Oropharynx is clear and moist. No oropharyngeal exudate.  Cardiovascular: Normal rate, regular rhythm and normal heart sounds. Exam reveals no gallop and no friction rub.  No murmur heard.  Pulmonary/Chest: Effort normal and breath sounds normal. No respiratory distress. He has no wheezes.  Ext: amputation of great toes  bilaterally. Feet are wrapped. 4 toes peeking out are warm, pink. Skin: Skin is warm and dry. No rash noted. No erythema.  Psychiatric: He has a normal mood and affect. His behavior is normal.     Lab Results Recent Labs    09/20/17 0529 09/21/17 0340 09/21/17 0833  WBC 9.1 9.4  --   HGB 9.4* 7.9*  --   HCT 30.5* 25.5*  --   NA 139  --  135  K 4.1  --  4.2  CL 105  --  101  CO2 26  --  23  BUN 9  --  13  CREATININE 0.81  --  0.90   Erythrocyte Sedimentation Rate     Component Value Date/Time   ESRSEDRATE 96 (H) 09/14/2017 0501    Microbiology: 6/21 blood cx 1 of 4 had CoNS, 6/23 blood cx ngtd, 6/25 blood cx ngtd Studies/Results: No results found. TEE negative  Assessment/Plan: mrsa bacteremia = currently on day 7 of treatment since last negative cultures for mrsa. Will need 7 more days of treatment. Will d/c dapto and change to vancomycin. July 5th will be last day of IV abtx. Can pull out picc line thereafter. Goal of vanco trough of 15-20.needs twice a week bmp while on vanco if going to rehab soon.  Gangrenous toes = now have source control with amputation. Will  d/c piptazo. Will defer wound care recs to vascular surgery  IDDM = recommend to continue with good control of DM so that he can have good wound healing in addn to being revascularized  Post surgery anemia = would trend cbc to see if any further drops in hgb.  Will sign off.  [facility] Cell: [phone] Pager: [phone]  09/21/2017, 11:49 AM

## 2017-09-21 NOTE — Progress Notes (Signed)
Orthopedic Tech Progress Note Patient Details:  Mike Wright 12/24/1933 [id number]  Ortho Devices Type of Ortho Device: Postop shoe/boot Ortho Device/Splint Location: bilateral Ortho Device/Splint Interventions: Application   Post Interventions Patient Tolerated: Well Instructions Provided: Care of device   Hildred Priest 09/21/2017, 10:21 AM

## 2017-09-21 NOTE — Progress Notes (Signed)
Subjective: The patient was resting in his bed today upon entering the room. He denied pain, stated that he was glad to have the surgery finished. He stated that his wounds had bled significantly overnight and that the bandages had to be changed. He denied concerns and is in agreement with the plan to escalate therapy over the next several days.   Objective:  Vital signs in last 24 hours: Vitals:   09/20/17 1658 09/20/17 1800 09/20/17 2100 09/21/17 0342  BP: 134/75 138/82 (!) 148/75 135/69  Pulse: 75  77 84  Resp: 17 17 (!) 21   Temp: 97.6 F (36.4 C)  98.3 F (36.8 C) 97.7 F (36.5 C)  TempSrc: Oral  Oral Oral  SpO2: 100% 92% 96%   Weight:    209 lb 6.4 oz (95 kg)  Height:       Physical Exam  Constitutional: He appears well-nourished. No distress.  Cardiovascular: Normal rate and regular rhythm.  No murmur heard. Pulmonary/Chest: Effort normal and breath sounds normal. No respiratory distress.  Abdominal: Soft. Bowel sounds are normal. He exhibits no distension.  Musculoskeletal: He exhibits tenderness (Bilateral great toes s/p amputation, w/in expected range). He exhibits no edema.  Neurological: He is alert.  Skin: Skin is warm. He is not diaphoretic.  Psychiatric: He has a normal mood and affect.  Vitals reviewed.  Assessment/Plan:  Principal Problem:   MRSA bacteremia Active Problems:   Diabetes ([place])   Chronic diastolic CHF (congestive heart failure) ([place])   Gangrene of bilateral great toes (H[facility])   Urinary retention  Assessment:  Mike Wright is an 84yo Mwith PMHx notable forCAD ( s/p stenting x2 to LAD and x1 to diag in February 2019), atrial fibrillation with paroxysmal supraventricular tachycardia, type 2 diabetes ( on insulin) who presents for evaluation of pain and blackening of his large toes on both feet. 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 Hewasadmitted for evaluation and inpatient intervention where he underwent PCI of the distal lower extremities as well as bilateral amputation of the great toes.   Plan: Ischemic limb Gangrene of bilateral first digits of the lower extremities: Patient underwent bilateral amputation of his great toes with good blood flow as per post surgical notes. In addition, he underwent ballooning of the right Posterior Tibial artery with good residual blood flow.  -- PT/OT ordered, can weight bear on heels and midfoot, non weight bearing on great toe area -- Patient will need to complete PT/OT following sufficient time to recover post-op as he may need assistance ambulating. --Discontinued Zosyn post op  MRSA: Observed on Culture routinely taken at admission.Repeat Bl Cx w/ gram positive coccion 09/15/2017. --TTE w/o vegetation commented on --TEEcompleted w/o vegetation on 09/18/2017 --Will need to complete a 9 day course of vancomycin treatment following his first set of clear cultures ending on 09/28/2017 --Repeat Cx'son 6/23 negativeto date --Repeat Cx's on 6/25 negative to date --Daily CBC's  Pulmonary Edema: Resolved. Denied orthopnea. Comfortable on room air. Continue to monitor. --Continue strict I/O's and daily weights --Repeat Lasix PRN for edema/dyspnea  P-A-fib: On Eliquis at home.Eliquis held due to pending procedure  Type 2 DM: A1c7.1%. On lantus 40 mg qAM 30 mg qPM at home. --Lantus to be continued at30U as patient is not consuming large portions of his meals  --Initiated correction scale insulin to better control his blood glucose --  CBG with meals and QHS --BMP daily  CAD: S/P LHC in 04/2017 with drug eluting stents placed. Will need to remain on Plavix. Denied ongoing chest pain. No acute issues. --Continued plavix 75mg  daily --Continue home atorvastatin  --Continue daily ASA 81mg   COPD: No acute  issues. --Continue Spiriva --Continue albuterol PRN  HTN: We will continue his home Amlodipine 10mg  daily, and Lisinopril 5mg  dailyand make the recommendation that they consider increasing the lisinopril at his outpatient visitshis blood pressure continues to be elevated.  Diet:Heart Healthy Code: Full Fluids: n/a GI PPX: Home Protonix 40mg  daily VTE QXI:[id number] Dispo: Anticipated discharge in approximately 2-3 day(s).   Mike Ludwig, MD 09/21/2017, 7:13 AM Pager: Pager# [phone]

## 2017-09-21 NOTE — Evaluation (Signed)
Physical Therapy Evaluation Patient Details Name: Mike Wright MRN: [id number] DOB: 1933-05-15 Today's Date: 09/21/2017   History of Present Illness  Pt is an 84 y/o male admitted secondary to bilat great toe ischemic gangrene. PMH includes DM, HTN, CAD, dCHF, COPD, and stent placement. Pt underwent 6/26 BLE hallux amputation with met heads, and angioplasty. AMB since admission has been limited to less than 20 feet, which is consisten twith PTA x2weeks.   Clinical Impression  Pt admitted with above diagnosis. Pt currently with functional limitations due to the deficits listed below (see "PT Problem List"). Upon entry, pt in bed, no family/caregiver present. The pt is awake and agreeable to participate.  Pt verbalizes his weight bearing precautions correctly when cued. Pt assisted with postop shoes. Pt moving well in bed, dizziness upon standing, ortho vitals noted: RN notified. Multiple standing trials performed to tolerance, usually 30-45 seconds prior to concerning dizziness/presyncope.   09/21/17 1505  Therapy Vitals  Patient Position (if appropriate) Orthostatic Vitals  Orthostatic Lying   BP- Lying 142/71  Orthostatic Sitting  BP- Sitting 138/74  Orthostatic Standing at 0 minutes  BP- Standing at 0 minutes 113/62 (tolerates standing ~30seconds priorto on set giddiness)   Functional mobility assessment demonstrates increased effort/time requirements, poor tolerance, and need for physical assistance, whereas the patient performed these at a higher level of independence PTA. Transfers requires physical assistance from a standard seat height. AMB is deferred d/t ortho stasis, but has been less than appropriate for Household AMB since admission. Pt has a significant falls history PTA, but verbalizes surprisingly low level of concern regarding his falls risk. Pt will benefit from skilled PT intervention to increase independence and safety with basic mobility in preparation for discharge to the  venue listed below.       Follow Up Recommendations Supervision for mobility/OOB(Pt not tolerating AMB distances >30f since admission; high PLOF)    Equipment Recommendations  None recommended by PT    Recommendations for Other Services       Precautions / Restrictions Precautions Precautions: Fall Precaution Comments: multiple falls PTA; orthostatic on 6/27 Restrictions Weight Bearing Restrictions: Yes RLE Weight Bearing: Weight bearing as tolerated(hindfoot/midfoot only in postop shoe) LLE Weight Bearing: Weight bearing as tolerated(hindfoot/midfoot only in postop shoe)      Mobility  Bed Mobility Overal bed mobility: Modified Independent       Supine to sit: Modified independent (Device/Increase time)     General bed mobility comments: moving well  Transfers Overall transfer level: Needs assistance Equipment used: Rolling walker (2 wheeled) Transfers: Sit to/from Stand Sit to Stand: Mod assist;Min guard;From elevated surface         General transfer comment: modA from EOB; MinGuard from elevated surface. Recurrent giddiness with standing at 30seconds, progressive  Ambulation/Gait Ambulation/Gait assistance: (deferred d/t orthostatic )              Stairs            Wheelchair Mobility    Modified Rankin (Stroke Patients Only)       Balance Overall balance assessment: Modified Independent   Sitting balance-Leahy Scale: Good       Standing balance-Leahy Scale: Fair                               Pertinent Vitals/Pain Pain Assessment: 0-10 Pain Score: 5  Pain Location: biulat surgical sites Pain Descriptors / Indicators: Aching Pain Intervention(s): Limited activity within  patient's tolerance;Monitored during session;Premedicated before session;Repositioned    Home Living Family/patient expects to be discharged to:: Private residence Living Arrangements: Spouse/significant other Available Help at Discharge:  Family;Available 24 hours/day Type of Home: House Home Access: Stairs to enter Entrance Stairs-Rails: Left Entrance Stairs-Number of Steps: 2 Home Layout: Two level;Able to live on main level with bedroom/bathroom Home Equipment: [name] - 2 wheels;Cane - single point;Bedside commode;Shower seat;Grab bars - toilet;Walker - 4 wheels;Crutches      Prior Function Level of Independence: Independent with assistive device(s)         Comments: Independent with ADLs. Had been using cane over the past week secondary to increased pain. Enjoys cooking, going to church, out to eat, and shopping with his wife.     Hand Dominance        Extremity/Trunk Assessment   Upper Extremity Assessment Upper Extremity Assessment: Overall WFL for tasks assessed    Lower Extremity Assessment Lower Extremity Assessment: Generalized weakness    Cervical / Trunk Assessment Cervical / Trunk Assessment: Normal  Communication   Communication: No difficulties  Cognition Arousal/Alertness: Awake/alert Behavior During Therapy: WFL for tasks assessed/performed Overall Cognitive Status: Within Functional Limits for tasks assessed                                        General Comments      Exercises General Exercises - Lower Extremity Long Arc Quad: AROM;10 reps;Both;Seated Other Exercises Other Exercises: seated ankle dorsiflexion 1x10x3secH bilat   Assessment/Plan    PT Assessment Patient needs continued PT services  PT Problem List Decreased strength;Decreased balance;Decreased activity tolerance;Decreased mobility;Decreased knowledge of use of DME;Pain       PT Treatment Interventions DME instruction;Gait training;Stair training;Functional mobility training;Therapeutic activities;Therapeutic exercise;Balance training;Patient/family education    PT Goals (Current goals can be found in the Care Plan section)  Acute Rehab PT Goals Patient Stated Goal: restore limited community  mobility capacity PT Goal Formulation: With patient Time For Goal Achievement: 10/06/17 Potential to Achieve Goals: Good    Frequency Min 3X/week   Barriers to discharge Inaccessible home environment      Co-evaluation               AM-PAC PT "6 Clicks" Daily Activity  Outcome Measure Difficulty turning over in bed (including adjusting bedclothes, sheets and blankets)?: None Difficulty moving from lying on back to sitting on the side of the bed? : None Difficulty sitting down on and standing up from a chair with arms (e.g., wheelchair, bedside commode, etc,.)?: A Lot Help needed moving to and from a bed to chair (including a wheelchair)?: A Lot Help needed walking in hospital room?: Total Help needed climbing 3-5 steps with a railing? : Total 6 Click Score: 14    End of Session Equipment Utilized During Treatment: Gait belt Activity Tolerance: Patient tolerated treatment well;Treatment limited secondary to medical complications (Comment)(orthostatic BP ) Patient left: with call bell/phone within reach;with family/visitor present;in chair Nurse Communication: Mobility status      Time: 1450-1518 PT Time Calculation (min) (ACUTE ONLY): 28 min   Charges:   PT Evaluation $PT Re-evaluation: 1 Re-eval PT Treatments $Therapeutic Activity: 8-22 mins   PT G Codes:        3:36 PM, October 15, 2017 Etta Grandchild, PT, DPT Physical Therapist - [place] [phone] (Pager)  [phone] (Office)     Chanin Frumkin C Oct 15, 2017, 3:31 PM

## 2017-09-21 NOTE — Telephone Encounter (Signed)
sch appt vm full 10/09/17 130pm wound check p/o MD

## 2017-09-22 DIAGNOSIS — Z9889 Other specified postprocedural states: Secondary | ICD-10-CM

## 2017-09-22 DIAGNOSIS — D62 Acute posthemorrhagic anemia: Secondary | ICD-10-CM

## 2017-09-22 LAB — GLUCOSE, CAPILLARY
GLUCOSE-CAPILLARY: 116 mg/dL — AB (ref 70–99)
GLUCOSE-CAPILLARY: 165 mg/dL — AB (ref 70–99)
GLUCOSE-CAPILLARY: 207 mg/dL — AB (ref 70–99)
Glucose-Capillary: 111 mg/dL — ABNORMAL HIGH (ref 70–99)
Glucose-Capillary: 167 mg/dL — ABNORMAL HIGH (ref 70–99)
Glucose-Capillary: 216 mg/dL — ABNORMAL HIGH (ref 70–99)

## 2017-09-22 LAB — CBC
HCT: 24.8 % — ABNORMAL LOW (ref 39.0–52.0)
HEMATOCRIT: 28.1 % — AB (ref 39.0–52.0)
HEMOGLOBIN: 7.4 g/dL — AB (ref 13.0–17.0)
HEMOGLOBIN: 8.6 g/dL — AB (ref 13.0–17.0)
MCH: 25.5 pg — ABNORMAL LOW (ref 26.0–34.0)
MCH: 25.9 pg — AB (ref 26.0–34.0)
MCHC: 29.8 g/dL — ABNORMAL LOW (ref 30.0–36.0)
MCHC: 30.6 g/dL (ref 30.0–36.0)
MCV: 85.5 fL (ref 78.0–100.0)
MCV: 84.6 fL (ref 78.0–100.0)
Platelets: 288 10*3/uL (ref 150–400)
Platelets: 280 10*3/uL (ref 150–400)
RBC: 2.9 MIL/uL — ABNORMAL LOW (ref 4.22–5.81)
RBC: 3.32 MIL/uL — AB (ref 4.22–5.81)
RDW: 17.3 % — ABNORMAL HIGH (ref 11.5–15.5)
RDW: 17.5 % — ABNORMAL HIGH (ref 11.5–15.5)
WBC: 10 10*3/uL (ref 4.0–10.5)
WBC: 8.7 10*3/uL (ref 4.0–10.5)

## 2017-09-22 LAB — BASIC METABOLIC PANEL
ANION GAP: 6 (ref 5–15)
BUN: 15 mg/dL (ref 8–23)
CALCIUM: 8.3 mg/dL — AB (ref 8.9–10.3)
CO2: 26 mmol/L (ref 22–32)
Chloride: 106 mmol/L (ref 98–111)
Creatinine, Ser: 0.81 mg/dL (ref 0.61–1.24)
Glucose, Bld: 126 mg/dL — ABNORMAL HIGH (ref 70–99)
Potassium: 4 mmol/L (ref 3.5–5.1)
SODIUM: 138 mmol/L (ref 135–145)

## 2017-09-22 LAB — CULTURE, BLOOD (ROUTINE X 2)
Culture: NO GROWTH
Culture: NO GROWTH
SPECIAL REQUESTS: ADEQUATE
Special Requests: ADEQUATE

## 2017-09-22 LAB — ABO/RH: ABO/RH(D): O POS

## 2017-09-22 LAB — PREPARE RBC (CROSSMATCH)

## 2017-09-22 MED ORDER — SODIUM CHLORIDE 0.9% IV SOLUTION
Freq: Once | INTRAVENOUS | Status: AC
  Administered 2017-09-22: 10 mL/h via INTRAVENOUS

## 2017-09-22 MED ORDER — APIXABAN 5 MG PO TABS
5.0000 mg | ORAL_TABLET | Freq: Two times a day (BID) | ORAL | Status: DC
  Administered 2017-09-22 – 2017-09-25 (×7): 5 mg via ORAL
  Filled 2017-09-22 (×7): qty 1

## 2017-09-22 MED ORDER — HYDROCHLOROTHIAZIDE 25 MG PO TABS
25.0000 mg | ORAL_TABLET | Freq: Every day | ORAL | Status: DC
Start: 2017-09-22 — End: 2017-09-25
  Administered 2017-09-22 – 2017-09-25 (×4): 25 mg via ORAL
  Filled 2017-09-22 (×4): qty 1

## 2017-09-22 MED ORDER — ATORVASTATIN CALCIUM 40 MG PO TABS
40.0000 mg | ORAL_TABLET | Freq: Every day | ORAL | Status: DC
  Administered 2017-09-22 – 2017-09-24 (×3): 40 mg via ORAL
  Filled 2017-09-22 (×3): qty 1

## 2017-09-22 NOTE — Progress Notes (Signed)
PT Cancellation Note  Patient Details Name: Mike Wright MRN: [id number] DOB: 09-Sep-1933   Cancelled Treatment:    Reason Eval/Treat Not Completed: Patient declined, no reason specified Patient and pt's wife declined PT today due to pt feeling dizzy. Pt lying supine upon arrival and reported just returned to bed from recliner with assistance of nursing staff. PT will continue to follow acutely.    [place] April, PTA Pager: [phone]   09/22/2017, 3:24 PM

## 2017-09-22 NOTE — Progress Notes (Addendum)
2:40pm Per MD note plan for DC Monday 7/1- facility updated and will have a bed for pt Monday  CSW called insurance and updated- they will need updated clinicals Monday to reapprove authorization  10:50am Patient has [place] for DC today if stable refererence # [id number]   has bed at preferred facility, [facility], as well.  CSW will continue to follow and assist with DC if stable  Jorge Ny, LCSW Clinical Social Worker [phone]

## 2017-09-22 NOTE — Progress Notes (Signed)
  Progress Note    09/22/2017 11:50 AM 2 Days Post-Op  Subjective:  No significant pain in GT amp sites   Vitals:   09/22/17 1000 09/22/17 1037  BP: (!) 175/147 (!) 131/54  Pulse:  78  Resp: (!) 23 (!) 27  Temp:  97.8 F (36.6 C)  SpO2: 93% 97%   Physical Exam: Lungs:  Non labored on RA Incisions:  R GT amp site with healthy wound bed, no purulence, minimal sanguinous oozing; L GT amp site healthy wound bed without purulence or continued bleeding Extremities:  R DP and PT by doppler; L DP and PT by doppler; L groin without palpable hematoma or pseudoaneurysm Abdomen:  Soft Neurologic: A&O  CBC    Component Value Date/Time   WBC 10.0 09/22/2017 0315   RBC 2.90 (L) 09/22/2017 0315   HGB 7.4 (L) 09/22/2017 0315   HGB 12.3 (L) 05/30/2017 1531   HCT 24.8 (L) 09/22/2017 0315   HCT 37.7 05/30/2017 1531   PLT 288 09/22/2017 0315   PLT 212 05/30/2017 1531   MCV 85.5 09/22/2017 0315   MCV 86 05/30/2017 1531   MCH 25.5 (L) 09/22/2017 0315   MCHC 29.8 (L) 09/22/2017 0315   RDW 17.3 (H) 09/22/2017 0315   RDW 16.0 (H) 05/30/2017 1531   LYMPHSABS 1.1 09/13/2017 1308   MONOABS 2.9 (H) 09/13/2017 1308   EOSABS 0.0 09/13/2017 1308   BASOSABS 0.1 09/13/2017 1308    BMET    Component Value Date/Time   NA 138 09/22/2017 0315   NA 139 05/30/2017 1531   K 4.0 09/22/2017 0315   CL 106 09/22/2017 0315   CO2 26 09/22/2017 0315   GLUCOSE 126 (H) 09/22/2017 0315   BUN 15 09/22/2017 0315   BUN 13 05/30/2017 1531   CREATININE 0.81 09/22/2017 0315   CALCIUM 8.3 (L) 09/22/2017 0315   GFRNONAA >60 09/22/2017 0315   GFRAA >60 09/22/2017 0315    INR    Component Value Date/Time   INR 1.20 09/20/2017 0529     Intake/Output Summary (Last 24 hours) at 09/22/2017 1150 Last data filed at 09/22/2017 0600 Gross per 24 hour  Intake 1090 ml  Output 1100 ml  Net -10 ml     Assessment/Plan:  84 y.o. male is s/p R PTA angioplasty and B GT amputations 2 Days Post-Op   - Perfusing  BLE well with dopplerable pedal signals - Wet to dry dressing changed today; Dr. Trula Slade to assess wound to decide wound vacs vs continued wet to dry - ok to restart Eliquis from vascular surgery standpoint - encouraged OOB; heel shoes needed if walking   Dagoberto Ligas, PA-C [facility] [phone] 09/22/2017 11:50 AM

## 2017-09-22 NOTE — Progress Notes (Signed)
Subjective: The patient was resting in his bed today. He denied dyspnea, chest pain or abdominal pain but continues to endorse moderate postsurgical pain in the right foot. He is in agreement with continuing postoperative evaluation and monitoring while undergoing rehab prior to a potential discharge early next week to a SNF.  Objective:  Vital signs in last 24 hours: Vitals:   09/21/17 0856 09/21/17 1240 09/21/17 2042 09/22/17 0414  BP: (!) 149/70 (!) 142/71 131/66 (!) 128/50  Pulse:  73    Resp: (!) 23 (!) 22 19 19   Temp: (!) 97.5 F (36.4 C) 98 F (36.7 C) 97.9 F (36.6 C) 97.9 F (36.6 C)  TempSrc: Oral Oral Oral Oral  SpO2:  97% 96% 96%  Weight:      Height:       Physical Exam  Constitutional: No distress.  Cardiovascular: Normal rate.  Murmur heard. Pulmonary/Chest: Effort normal. No respiratory distress. He has rales (Mild crackles bilaterally).  Abdominal: Soft. Bowel sounds are normal. He exhibits no distension.  Musculoskeletal: He exhibits no edema.  Neurological: He is alert.  Skin: Skin is warm. He is not diaphoretic.  Psychiatric: He has a normal mood and affect.   Assessment/Plan:  Principal Problem:   MRSA bacteremia Active Problems:   Diabetes ([place])   Chronic diastolic CHF (congestive heart failure) ([place])   Gangrene of bilateral great toes ([facility])   Urinary retention  Assessment:  Mr. Ager is an 84yo Mwith PMHx notable forCAD ( s/p stenting x2 to LAD and x1 to diag in February 2019), atrial fibrillation with paroxysmal supraventricular tachycardia, type 2 diabetes ( on insulin) who presents for evaluation of pain and blackening of his large toes on both feet. The presentationwas concerning for severe distal PAD with secondary dry gangrene with concern for secondary infection given that the skin barrier was broken multiple times in a non-sterile environment. Hewasadmitted for evaluation and inpatient interventionwhere he underwent PCI of the  distal lower extremities as well as bilateral amputation of the great toes.  As per CSW, patient has bed available but is not yet stable for discharge from a medical standpoint. He will need to have his Hgb monitored closely after he receives a unit of blood today for symptomatic anemia.   Plan: Ischemic limb Gangrene of bilateral first digits of the lower extremities: Patient underwent bilateral amputation of his great toes with good blood flow as per post surgical notes. In addition, he underwent ballooning of the right Posterior Tibial artery with good residual blood flow.  -- PT/OT ordered, can weight bear on heels and midfoot, non weight bearing on great toe area -- Patient will need to complete PT/OT following sufficient time to recover post-op as he may need assistance ambulating. -- Discontinued Zosyn post op -- Fentanyl 12.45mcg Q12 hours PRN -- Postop anemia, One Unit PRBC's ordered  MRSA: Observed on Culture routinely taken at admission.Repeat Bl Cx w/ gram positive coccion 09/15/2017. --Will need to complete a 9 day course of vancomycin treatment following his first set of clear cultures ending on 09/28/2017 --Repeat Cx'son 6/23 negativeto date --Repeat Cx's on 6/25 negative to date --Daily CBC's  Pulmonary Edema: Resolved. Denied orthopnea. Comfortable on room air. Continue to monitor.Weight neutral. --Continue strict I/O's and daily weights --Repeat Lasix PRN for edema/dyspnea  P-A-fib: On Eliquis at home.Eliquis held due to pending procedure  Type 2 DM: A1c7.1%. On lantus 40 mg qAM 30 mg qPM at home. --Lantusto be continued at30U as patient is not consuming  large portions of his meals  --Initiated correction scale insulin to better control his blood glucose --CBG with meals and QHS --BMP daily  CAD: S/P LHC in 04/2017 with drug eluting stents placed. Will need to remain on Plavix. Denied ongoing chest pain.No acute issues. --Continued plavix 75mg   daily --Continue home atorvastatin  --Continue daily ASA 81mg   COPD: No acute issues. --Continue Spiriva --Continue albuterol PRN  HTN: Continue his home Amlodipine 10mg  daily, and Lisinopril 5mg  dailyand make the recommendation that they consider increasing the lisinopril at his outpatient visitshis blood pressure continues to be elevated.  Diet:Heart Healthy Code: Full Fluids: n/a GI PPX: Home Protonix 40mg  daily VTE ZBF:[id number] Dispo: Anticipated discharge in approximately 3 day(s) on 7/01.   Kathi Ludwig, MD 09/22/2017, 6:39 AM Pager: Pager# [phone]

## 2017-09-22 NOTE — Discharge Summary (Signed)
 Name: Mike Wright MRN: [id number] DOB: 09/09/1933 83 y.o. PCP: Medicine, [facility]  Date of Admission: 09/13/2017 11:45 AM Date of Discharge: 09/25/17 Attending Physician: Dr. Klima  Discharge Diagnosis: 1. 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 MRSA Bacteremia 3. CAD 4. P-atrial fibrillation   Discharge Medications: Allergies as of 09/25/2017      Reactions   Codeine Shortness Of Breath   Heparin Other (See Comments)   +HIT,  Severe bleeding    Losartan Swelling   Per ENT UNSPECIFIED REACTION    Other Other (See Comments)   Severe bleeding UNSPECIFIED AGENT    Oxycodone Other (See Comments)   "Made me act out of my mind" Other reaction(s): Other (See Comments) Mental status changes hallucinations      Medication List    STOP taking these medications   hydrochlorothiazide 25 MG tablet Commonly known as:  HYDRODIURIL   Magnesium 250 MG Tabs   metFORMIN 1000 MG tablet Commonly known as:  GLUCOPHAGE     TAKE these medications   acetaminophen 500 MG tablet Commonly known as:  TYLENOL Take 500 mg by mouth as needed for mild pain or headache.   albuterol (2.5 MG/3ML) 0.083% nebulizer solution Commonly known as:  PROVENTIL Inhale 3 mLs into the lungs every 4 (four) hours as needed for wheezing or shortness of breath.   amLODipine 10 MG tablet Commonly known as:  NORVASC Take 1 tablet (10 mg total) by mouth daily.   apixaban 5 MG Tabs tablet Commonly known as:  ELIQUIS Take 1 tablet (5 mg total) by mouth 2 (two) times daily.   atorvastatin 80 MG tablet Commonly known as:  LIPITOR Take 1 tablet (80 mg total) by mouth every evening.   B-12 2500 MCG Tabs Take 2,500 mcg by mouth daily.   clopidogrel 75 MG tablet Commonly known as:  PLAVIX Take 1 tablet (75 mg total) by mouth daily.   D3-1000 PO Take 2,000 Units by mouth daily.   finasteride 5 MG tablet Commonly known as:  PROSCAR Take 5  mg by mouth daily.   furosemide 20 MG tablet Commonly known as:  LASIX TAKE 1 TABLET DAILY AS NEEDED   insulin glargine 100 UNIT/ML injection Commonly known as:  LANTUS Inject 30-40 Units into the skin See admin instructions. Inject 40 units SQ in the morning and inject 30 units SQ at bedtime   levalbuterol 45 MCG/ACT inhaler Commonly known as:  XOPENEX HFA Inhale 1 puff into the lungs every 6 (six) hours as needed for wheezing or shortness of breath.   lisinopril 5 MG tablet Commonly known as:  PRINIVIL,ZESTRIL Take 1 tablet (5 mg total) by mouth daily.   meclizine 12.5 MG tablet Commonly known as:  ANTIVERT Take 1 tablet (12.5 mg total) by mouth 3 (three) times daily as needed for dizziness.   metoprolol succinate 100 MG 24 hr tablet Commonly known as:  TOPROL-XL TAKE 1/2 TABLET EVERY DAY   nitroGLYCERIN 0.4 MG SL tablet Commonly known as:  NITROSTAT PLACE 1 TABLET (0.4 MG TOTAL) UNDER THE TONGUE EVERY 5  MINUTES AS NEEDED FOR CHEST PAIN.   pantoprazole 40 MG tablet Commonly known as:  PROTONIX Take 1 tablet (40 mg total) by mouth daily. To protect stomach while taking multiple blood thinners.   psyllium 58.6 % packet Commonly known as:  METAMUCIL Take 1 packet by mouth daily.   tiotropium 18 MCG inhalation capsule Commonly known as:    SPIRIVA Place 1 capsule (18 mcg total) into inhaler and inhale daily.   traZODone 100 MG tablet Commonly known as:  DESYREL Take 100 mg by mouth at bedtime.   vancomycin 1,250 mg in sodium chloride 0.9 % 250 mL Inject 1,250 mg into the vein every 12 (twelve) hours for 5 days.   vancomycin IVPB Inject 1,250 mg into the vein every 12 (twelve) hours for 5 days. Indication:  MRSA Last Day of Therapy:  09/29/2017 Labs - _0 /01/19 1308      Disposition and follow-up:   MikeMike Wright was discharged from [facility] in Stable condition.  At the hospital follow up visit please address:  1.  A. 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  NON-weightbearing on toes, heal and midfoot okay.  Moderate bleeding expected, vascular will not perform cauterization of the wounds as this would compromise the tissue. Pack wound to stop bleeding and discontinue/alter anticoagulation. Patient is being discharged with wound VAC, vascular surgery will take care of that wound VAC as an outpatient. B. MRSA Bacteremia: To complete MRSA Tx with Vancomycin by 07/05. OPAT completed. C. CAD: continued Plavix due to recent stent placement. Held ASA, continued  atorvastatin D. P-atrial fibrillation: Continued Eliquis, metoprolol  2.  Labs / imaging needed at time of follow-up: CBC, BMP in one week for Hgb and renal function  3.  Pending labs/ test needing follow-up: None  Follow-up Appointments:  Contact information for follow-up providers    Serafina Mitchell, MD Follow up in 2 week(s).   Specialties:  Vascular Surgery, Cardiology Why:  office will call Contact information: [address] [place] [address] [phone]            Contact information for after-discharge care    Destination    [facility] .   Service:  Skilled Nursing Contact information: [address] [place] [facility] Course by problem list: S/p bilateral amputation of the first digits of the lower extremities 2/2 dry gangrene: Mike Wright is an 84yo Mwith PMHx notable forCAD ( s/p stenting x2 to LAD and x1 to diag in February 2019), atrial fibrillation with paroxysmal supraventricular tachycardia, type 2 diabetes ( on insulin) who presents for evaluation of pain and blackening of his large toes on both feet. The presentationwas concerning for severe distal PAD with secondary dry gangrene with concern for secondary infection given that the skin barrier was broken multiple times in a non-sterile environment. Hewasadmitted for evaluation and inpatient  interventionwhere he underwent PCI of the distal lower extremities as well as bilateral amputation of the great toes with antibiotic treatment for his bacteremia.The patient improved rapidly and was stable for discharge to a skilled nursing facility with wound VAC on 09/25/2017. His stay was complicated by symptomatic acute on chronic anemia, volume overload, MRSA bacteremia, and maintenance of his insulin dependent DMII, CAD, and Paroxysmal atrial fibrillation.   MRSA: Observed on Culture routinely taken at admission patient without SIRS criteria. Repeat Bl Cx w/ gram positive coccion 09/15/2017. Third culture set on 06/23 was negative and as such this was set as day one of a 14 day course of MRSA treatment. Daptomycin was used initially to avoid nephrotoxicity given the need for multiple angiograms but then converted to vancomycin once renal function was noted to be stable. He will complete his course on 07/05.  Echo done during current hospitalization was without any evidence of endocarditis.  Pulmonary Edema: Notable dyspnea and mildly reduced SpO2 responded well to diureses. Placed back on lasix oral on discharge.   P-A-fib: On Eliquis at home as well as metoprolol. No issues noted during admission. Resumed home meds at discharge.   Type 2 DM: A1c7.1%. On lantus 40 mg qAM 30 mg qPM at home. Did well on Lantus 30U bid while admitted. Recommended remaining on 30U BID until he sees his PCP.  CAD: Remained on plavix during admission. Stopped ASA.  HTN: Continue his home Amlodipine 48m daily, and Lisinopril 559mdaily.  Discharge Vitals:   BP 128/71   Pulse 72   Temp 97.7 F (36.5 C) (Oral)   Resp 19   Ht 5' 7.01" (1.702 m)   Wt 197 lb 9.6 oz (89.6 kg)   SpO2 99%   BMI 30.94 kg/m   Pertinent Labs, Studies, and Procedures:  CBC Latest Ref Rng & Units 09/25/2017 09/24/2017 09/23/2017  WBC 4.0 - 10.5 K/uL 10.6(H) 7.9 8.5  Hemoglobin 13.0 - 17.0 g/dL 9.0(L) 8.9(L) 9.0(L)    Hematocrit 39.0 - 52.0 % 29.1(L) 29.0(L) 29.1(L)  Platelets 150 - 400 K/uL 258 258 266   CMP Latest Ref Rng & Units 09/25/2017 09/24/2017 09/23/2017  Glucose 70 - 99 mg/dL 170(H) 135(H) 95  BUN 8 - 23 mg/dL _0 Creatinine 0.61 - 1.24 mg/dL  0.97 0.86 0.80  Sodium 135 - 145 mmol/L 138 137 138  Potassium 3.5 - 5.1 mmol/L 4.1 3.5 3.6  Chloride 98 - 111 mmol/L 101 105 106  CO2 22 - 32 mmol/L 27 27 25  Calcium 8.9 - 10.3 mg/dL 8.9 8.4(L) 8.3(L)  Total Protein 6.5 - 8.1 g/dL - - -  Total Bilirubin 0.3 - 1.2 mg/dL - - -  Alkaline Phos 38 - 126 U/L - - -  AST 15 - 41 U/L - - -  ALT 17 - 63 U/L - - -   Echo: Study Conclusions - Left ventricle: The cavity size was normal. Wall thickness was   increased in a pattern of moderate LVH. Systolic function was   normal. The estimated ejection fraction was in the range of 55%   to 60%. Wall motion was normal; there were no regional wall   motion abnormalities. - Aortic valve: Trileaflet; moderately thickened, moderately   calcified leaflets. Valve mobility was restricted. There was mild   stenosis. There was mild regurgitation. Peak velocity (S): 277   cm/s. Mean gradient (S): 16 mm Hg. Valve area (VTI): 2.01 cm^2.   Valve area (Vmax): 1.8 cm^2. Valve area (Vmean): 1.73 cm^2. - Mitral valve: There was mild regurgitation. Valve area by   pressure half-time: 0.93 cm^2. - Pulmonary arteries: Systolic pressure was mildly increased. PA   peak pressure: 44 mm Hg (S).  Discharge Instructions: Discharge Instructions    Call MD for:  difficulty breathing, headache or visual disturbances   Complete by:  As directed    Call MD for:  severe uncontrolled pain   Complete by:  As directed    Call MD for:  temperature >100.4   Complete by:  As directed    Diet - low sodium heart healthy   Complete by:  As directed    Diet - low sodium heart healthy   Complete by:  As directed    Discharge instructions   Complete by:  As directed    It was pleasure  taking care of you. Please follow-up with your surgeon as directed. You will take IV antibiotic till September 29, 2017.   Home infusion instructions [facility] May follow [facility] Dosing Protocol; May administer Cathflo as needed to maintain patency of vascular access device.; Flushing of vascular access device: per [facility] Protocol: 0.9% NaCl pre/post medica...   Complete by:  As directed    Instructions:  May follow [facility] Dosing Protocol   Instructions:  May administer Cathflo as needed to maintain patency of vascular access device.   Instructions:  Flushing of vascular access device: per [facility] Protocol: 0.9% NaCl pre/post medication administration and prn patency; Heparin 100 u/ml, 5ml for implanted ports and Heparin 10u/ml, 5ml for all other central venous catheters.   Instructions:  May follow [facility] Anaphylaxis Protocol for First Dose Administration in the home: 0.9% NaCl at 25-50 ml/hr to maintain IV access for protocol meds. Epinephrine 0.3 ml IV/IM PRN and Benadryl 25-50 IV/IM PRN s/s of anaphylaxis.   Instructions:  [facility] Infusion Coordinator (RN) to assist per patient IV care needs in the home PRN.   Increase activity slowly   Complete by:  As directed    Increase activity slowly   Complete by:  As directed       Signed: , , MD 09/25/2017, 12:54 PM   Pager: Pager# [phone]  

## 2017-09-22 NOTE — Progress Notes (Signed)
Internal Medicine Attending:   I saw and examined the patient. I reviewed the resident's note and I agree with the resident's findings and plan as documented in the resident's note.  Hospital day #10 with MRSA bacteremia and bilateral gangrene toes that are now amputated. He had some easy bleeding during the procedure, wounds were left partially open. He had dizziness with standing yesterday, Hgb is 7.4 grams today. Will transfuse 1 unit prbc for symptomatic anemia. We will need to coordinate wound recs with vascular surgery, and timing for re-starting anticoagulation for atrial fibrillation. Once those are settled, we can transfer to [facility] to complete IV vancomycin for the MRSA bacteremia.

## 2017-09-22 NOTE — Progress Notes (Signed)
Occupational Therapy Treatment Patient Details Name: Mike Wright MRN: [id number] DOB: [birth year] Today's Date: 09/22/2017    History of present illness Pt is an 84 y/o male admitted secondary to bilat great toe ischemic gangrene. PMH includes DM, HTN, CAD, dCHF, COPD, and stent placement. Pt underwent 6/26 BLE hallux amputation with met heads, and angioplasty. AMB since admission has been limited to less than 20 feet, which is consisten twith PTA x2weeks.    OT comments  Pt able to perform hallway mobility today with min assist for balance. Pt managed toileting and grooming task in standing with min guard assist. SpO2 92% on RA after activity; no c/o of dizziness this session. Pt planning for d/c to SNF for continued rehab; agree with short term SNF placement prior to return home. Will continue to follow acutely.   Follow Up Recommendations  SNF    Equipment Recommendations  None recommended by OT    Recommendations for Other Services      Precautions / Restrictions Precautions Precautions: Fall Precaution Comments: bil great toe amputations Required Braces or Orthoses: Other Brace/Splint Other Brace/Splint: bil post op shoes for ambulation Restrictions Weight Bearing Restrictions: Yes RLE Weight Bearing: (WB through heel only with post op shoe) LLE Weight Bearing: (WB through heel only with post op shoe)       Mobility Bed Mobility Overal bed mobility: Needs Assistance Bed Mobility: Supine to Sit     Supine to sit: Supervision     General bed mobility comments: Supervision for safety as pt was orthostatic yesterday. No c/o dizziness with positional changes.  Transfers Overall transfer level: Needs assistance Equipment used: Rolling walker (2 wheeled) Transfers: Sit to/from Stand Sit to Stand: Min assist         General transfer comment: Min assist to boost up from EOB and for steadying initially in standing.    Balance Overall balance assessment: Needs  assistance Sitting-balance support: Feet supported Sitting balance-Leahy Scale: Good     Standing balance support: No upper extremity supported;During functional activity Standing balance-Leahy Scale: Fair Standing balance comment: Static standing                           ADL either performed or assessed with clinical judgement   ADL Overall ADL's : Needs assistance/impaired     Grooming: Min guard;Wash/dry hands;Standing               Lower Body Dressing: Maximal assistance Lower Body Dressing Details (indicate cue type and reason): to don post op shoes bil Toilet Transfer: Minimal assistance;Ambulation;Regular Toilet;RW   Toileting- Clothing Manipulation and Hygiene: Supervision/safety;Sitting/lateral lean Toileting - Clothing Manipulation Details (indicate cue type and reason): for peri care     Functional mobility during ADLs: Minimal assistance;Rolling walker General ADL Comments: No c/o dizziness today with positional changes.     Vision       Perception     Praxis      Cognition Arousal/Alertness: Awake/alert Behavior During Therapy: WFL for tasks assessed/performed Overall Cognitive Status: Within Functional Limits for tasks assessed                                          Exercises     Shoulder Instructions       General Comments SpO2 92% on RA after activity. No c/o dizziness today  Pertinent Vitals/ Pain       Pain Assessment: Faces Faces Pain Scale: Hurts little more Pain Location: bil feet Pain Descriptors / Indicators: Discomfort;Sore Pain Intervention(s): Monitored during session  Home Living                                          Prior Functioning/Environment              Frequency  Min 2X/week        Progress Toward Goals  OT Goals(current goals can now be found in the care plan section)  Progress towards OT goals: Progressing toward goals  Acute Rehab OT  Goals Patient Stated Goal: rehab before home OT Goal Formulation: With patient/family  Plan Discharge plan needs to be updated    Co-evaluation                 AM-PAC PT "6 Clicks" Daily Activity     Outcome Measure   Help from another person eating meals?: None Help from another person taking care of personal grooming?: A Little Help from another person toileting, which includes using toliet, bedpan, or urinal?: A Little Help from another person bathing (including washing, rinsing, drying)?: A Little Help from another person to put on and taking off regular upper body clothing?: A Little Help from another person to put on and taking off regular lower body clothing?: A Lot 6 Click Score: 18    End of Session Equipment Utilized During Treatment: Gait belt;Rolling walker  OT Visit Diagnosis: Unsteadiness on feet (R26.81);Other abnormalities of gait and mobility (R26.89);Muscle weakness (generalized) (M62.81);Pain Pain - Right/Left: (bil) Pain - part of body: Ankle and joints of foot   Activity Tolerance Patient tolerated treatment well   Patient Left in chair;with call bell/phone within reach;with family/visitor present   Nurse Communication Mobility status;Other (comment)(pt had BM during session)        Time: 8891-6945 OT Time Calculation (min): 23 min  Charges: OT General Charges $OT Visit: 1 Visit OT Treatments $Self Care/Home Management : 8-22 mins $Therapeutic Activity: 8-22 mins  Emersynn Deatley A. Ulice Brilliant, M.S., OTR/L Acute Rehab Department: [phone]   Binnie Kand 09/22/2017, 2:09 PM

## 2017-09-22 NOTE — Discharge Instructions (Signed)
Thank you for your visit to the [facility].  I have continued your home medications. You will need to continue on Vancomycin until 07/05 to treat the infection that you had in your blood. You will want to ask your doctor at a hospital discharge follow to make sure your blood cultures were clear but the set drawn on 06/23 did not grow anything for five days which is typically considered cleared.   If you develop a fever, chills, nausea, vomiting, muscle aches or other concerning symptoms please let us know at any time or call your primary care office. We can be reached at our office at [phone] and we will get back with you as soon as possible.   Bacteremia Bacteremia is the presence of bacteria in the blood. When bacteria enter the bloodstream, they can cause a life-threatening reaction called sepsis, which is a medical emergency. Bacteremia can spread to other parts of the body, including the heart, joints, and brain. What are the causes? This condition is caused by bacteria that get into the blood. Bacteria can enter the blood:  From a skin infection or a cut on your skin.  During an episode of pneumonia.  From an infection in your stomach or intestine (gastrointestinal infection).  From an infection in your bladder or urinary system (urinary tract infection).  During a dental or medical procedure.  After you brush your teeth so hard that your gums bleed.  When a bacterial infection in another part of the body spreads to the blood.  Through a dirty needle.  What increases the risk? This condition is more likely to develop in:  Children.  Elderly adults.  People who have a long-lasting (chronic) disease or medical condition.  People who have an artificial joint or heart valve.  People who have heart valve disease.  People who have a tube, such as a catheter or IV tube, that has been inserted for a medical treatment.  People who have a weak body defense system  (immune system).  People who use IV drugs.  What are the signs or symptoms? Symptoms of this condition include:  Fever.  Chills.  A racing heart.  Shortness of breath.  Dizziness.  Weakness.  Confusion.  Nausea or vomiting.  Diarrhea.  In some cases, there are no symptoms. Bacteremia that has spread to the other parts of the body may cause symptoms in those areas. How is this diagnosed? This condition may be diagnosed with a physical exam and tests, such as:  A complete blood count (CBC). This test looks for signs of infection.  Blood cultures. These look for bacteria in your blood.  Tests of any tubes that you may have inserted into your body, such as an IV tube or urinary catheter. These tests look for a source of infection.  Urine tests including urine cultures. These look for bacteria in the urine that could be a source of infection.  Imaging tests, such as an X-ray, CT scan, MRI, or heart ultrasound. These look for a source of infection in other parts of the body, such as the lungs, heart valves, or joints.  How is this treated? This condition may be treated with:  Antibiotic medicines given through an IV infusion. Depending on the source of infection, antibiotics may be needed for several weeks. At first, an antibiotic may be given to kill most types of blood bacteria. If your test results show that a certain kind of bacteria is causing problems, the antibiotic may  be changed to kill only the bacteria that are causing problems.  Antibiotics taken by mouth.  IV fluids to support the body as you fight the infection.  Removing any catheter or device that could be a source of infection.  Blood pressure and breathing support, if you have sepsis.  Surgery to control the source or spread of infection, such as: ? Removing an infected implanted device. ? Removing infected tissue or an abscess.  This condition is usually treated at a hospital. If you are treated at  home, you may need to come back for medicines, blood tests, and evaluation. This is important. Follow these instructions at home:  Take over-the-counter and prescription medicines only as told by your health care provider.  If you were prescribed an antibiotic, take it as told by your health care provider. Do not stop taking the antibiotic even if you start to feel better.  Rest until your condition is under control.  Drink enough fluid to keep your urine clear or pale yellow.  Do not smoke. If you need help quitting, ask your health care provider.  Keep all follow-up visits as told by your health care provider. This is important. How is this prevented?  Get the vaccinations that your health care provider recommends.  Clean and cover any scrapes or cuts.  Take good care of your skin. This includes regular bathing and moisturizing.  Wash your hands often.  Practice good oral hygiene. Brush your teeth two times a day and floss regularly. Get help right away if:  You have pain.  You have a fever.  You have trouble breathing.  Your skin becomes blotchy, pale, or clammy.  You develop confusion, dizziness, or weakness.  You develop diarrhea.  You develop any new symptoms after treatment. Summary  Bacteremia is the presence of bacteria in the blood. When bacteria enter the bloodstream, they can cause a life- threatening reaction called sepsis.  Children and elderly adults are at increased risk of bacteremia. Other risk factors include having a long-lasting (chronic) disease or a weak immune system, having an artificial joint or heart valve, having heart valve disease, having tubes that were inserted in the body for medical treatment, or using IV drugs.  Some symptoms of bacteremia include fever, chills, shortness of breath, confusion, nausea or vomiting, and diarrhea.  Tests may be done to diagnose a source of infection that led to bacteremia. These tests may include blood  tests, urine tests, and imaging tests.  Bacteremia is usually treated with antibiotics, usually in a hospital. This information is not intended to replace advice given to you by your health care provider. Make sure you discuss any questions you have with your health care provider. Document Released: 12/26/2005 Document Revised: 02/09/2016 Document Reviewed: 02/09/2016 Elsevier Interactive Patient Education  [employer].

## 2017-09-23 LAB — BASIC METABOLIC PANEL
ANION GAP: 7 (ref 5–15)
BUN: 10 mg/dL (ref 8–23)
CALCIUM: 8.3 mg/dL — AB (ref 8.9–10.3)
CO2: 25 mmol/L (ref 22–32)
Chloride: 106 mmol/L (ref 98–111)
Creatinine, Ser: 0.8 mg/dL (ref 0.61–1.24)
GLUCOSE: 95 mg/dL (ref 70–99)
POTASSIUM: 3.6 mmol/L (ref 3.5–5.1)
SODIUM: 138 mmol/L (ref 135–145)

## 2017-09-23 LAB — GLUCOSE, CAPILLARY
GLUCOSE-CAPILLARY: 102 mg/dL — AB (ref 70–99)
GLUCOSE-CAPILLARY: 218 mg/dL — AB (ref 70–99)
GLUCOSE-CAPILLARY: 224 mg/dL — AB (ref 70–99)
Glucose-Capillary: 106 mg/dL — ABNORMAL HIGH (ref 70–99)
Glucose-Capillary: 181 mg/dL — ABNORMAL HIGH (ref 70–99)
Glucose-Capillary: 192 mg/dL — ABNORMAL HIGH (ref 70–99)

## 2017-09-23 LAB — TYPE AND SCREEN
ABO/RH(D): O POS
Antibody Screen: NEGATIVE
UNIT DIVISION: 0

## 2017-09-23 LAB — BPAM RBC
BLOOD PRODUCT EXPIRATION DATE: 201907252359
ISSUE DATE / TIME: 201906281009
Unit Type and Rh: 5100

## 2017-09-23 LAB — CBC
HCT: 29.1 % — ABNORMAL LOW (ref 39.0–52.0)
Hemoglobin: 9 g/dL — ABNORMAL LOW (ref 13.0–17.0)
MCH: 25.9 pg — ABNORMAL LOW (ref 26.0–34.0)
MCHC: 30.9 g/dL (ref 30.0–36.0)
MCV: 83.9 fL (ref 78.0–100.0)
PLATELETS: 266 10*3/uL (ref 150–400)
RBC: 3.47 MIL/uL — AB (ref 4.22–5.81)
RDW: 17.3 % — ABNORMAL HIGH (ref 11.5–15.5)
WBC: 8.5 10*3/uL (ref 4.0–10.5)

## 2017-09-23 MED ORDER — VANCOMYCIN HCL 10 G IV SOLR: 1250.0000 mg | Freq: Two times a day (BID) | INTRAVENOUS | 0 refills | Status: AC

## 2017-09-23 MED ORDER — INSULIN ASPART 100 UNIT/ML ~~LOC~~ SOLN
2.0000 [IU] | Freq: Three times a day (TID) | SUBCUTANEOUS | Status: DC
  Administered 2017-09-24 – 2017-09-25 (×4): 2 [IU] via SUBCUTANEOUS

## 2017-09-23 NOTE — Progress Notes (Signed)
Pt's HR drops down to 40s at times while asleep.   Lupita Dawn, RN

## 2017-09-23 NOTE — Progress Notes (Addendum)
Subjective  - POD #3  No complaints   Physical Exam:  Dressing changed No further bleeding Wound edges look viable Brisk doppler signals bilateral DP/PT   Assessment/Plan:  POD #3  Post op shoe Heel weight bearing Change to wound vac for toe amputation sites Continue abx x 7-10 days post op  Mike Wright 09/23/2017 1:37 PM --  Vitals:   09/23/17 0823 09/23/17 1317  BP:  (!) 140/56  Pulse:  (!) 58  Resp:  18  Temp:  97.8 F (36.6 C)  SpO2: 95% 96%    Intake/Output Summary (Last 24 hours) at 09/23/2017 1337 Last data filed at 09/23/2017 1300 Gross per 24 hour  Intake 1260 ml  Output 900 ml  Net 360 ml     Laboratory CBC    Component Value Date/Time   WBC 8.5 09/23/2017 0500   HGB 9.0 (L) 09/23/2017 0500   HGB 12.3 (L) 05/30/2017 1531   HCT 29.1 (L) 09/23/2017 0500   HCT 37.7 05/30/2017 1531   PLT 266 09/23/2017 0500   PLT 212 05/30/2017 1531    BMET    Component Value Date/Time   NA 138 09/23/2017 0500   NA 139 05/30/2017 1531   K 3.6 09/23/2017 0500   CL 106 09/23/2017 0500   CO2 25 09/23/2017 0500   GLUCOSE 95 09/23/2017 0500   BUN 10 09/23/2017 0500   BUN 13 05/30/2017 1531   CREATININE 0.80 09/23/2017 0500   CALCIUM 8.3 (L) 09/23/2017 0500   GFRNONAA >60 09/23/2017 0500   GFRAA >60 09/23/2017 0500    COAG Lab Results  Component Value Date   INR 1.20 09/20/2017   INR 1.54 09/18/2017   INR 1.32 09/15/2017   No results found for: PTT  Antibiotics Anti-infectives (From admission, onward)   Start     Dose/Rate Route Frequency Ordered Stop   09/22/17 1200  vancomycin (VANCOCIN) 1,250 mg in sodium chloride 0.9 % 250 mL IVPB     1,250 mg 166.7 mL/hr over 90 Minutes Intravenous Every 12 hours 09/21/17 1155 09/29/17 1300   09/22/17 0000  vancomycin (VANCOCIN) 1,250 mg in sodium chloride 0.9 % 250 mL IVPB  Status:  Discontinued     1,250 mg 166.7 mL/hr over 90 Minutes Intravenous Every 12 hours 09/21/17 1150 09/21/17 1155   09/22/17 0000  vancomycin (VANCOCIN) 1,750 mg in sodium chloride 0.9 % 500 mL IVPB     1,750 mg 250 mL/hr over 120 Minutes Intravenous  Once 09/21/17 1155 09/22/17 0140   09/21/17 1200  vancomycin (VANCOCIN) 1,750 mg in sodium chloride 0.9 % 500 mL IVPB  Status:  Discontinued     1,750 mg 250 mL/hr over 120 Minutes Intravenous  Once 09/21/17 1149 09/21/17 1155   09/20/17 1000  ceFAZolin (ANCEF) IVPB 1 g/50 mL premix  Status:  Discontinued    Note to Pharmacy:  Send with pt to OR   1 g 100 mL/hr over 30 Minutes Intravenous 30 min pre-op 09/19/17 1056 09/20/17 1642   09/18/17 1800  DAPTOmycin (CUBICIN) 775 mg in sodium chloride 0.9 % IVPB  Status:  Discontinued     775 mg 231 mL/hr over 30 Minutes Intravenous Every 24 hours 09/18/17 1730 09/21/17 1050   09/15/17 1800  DAPTOmycin (CUBICIN) 775 mg in sodium chloride 0.9 % IVPB  Status:  Discontinued     775 mg 231 mL/hr over 30 Minutes Intravenous Every 24 hours 09/15/17 1700 09/18/17 1730   09/15/17 1800  piperacillin-tazobactam (ZOSYN) IVPB 3.375  g  Status:  Discontinued     3.375 g 12.5 mL/hr over 240 Minutes Intravenous Every 8 hours 09/15/17 1700 09/21/17 0712   09/15/17 0200  vancomycin (VANCOCIN) 1,250 mg in sodium chloride 0.9 % 250 mL IVPB  Status:  Discontinued     1,250 mg 166.7 mL/hr over 90 Minutes Intravenous Every 12 hours 09/14/17 1237 09/14/17 1451   09/14/17 1500  DAPTOmycin (CUBICIN) 774.5 mg in sodium chloride 0.9 % IVPB  Status:  Discontinued     8 mg/kg  96.8 kg 231 mL/hr over 30 Minutes Intravenous Every 24 hours 09/14/17 1452 09/15/17 1525   09/14/17 1245  vancomycin (VANCOCIN) 2,000 mg in sodium chloride 0.9 % 500 mL IVPB  Status:  Discontinued     2,000 mg 250 mL/hr over 120 Minutes Intravenous  Once 09/14/17 1237 09/14/17 1501   09/13/17 1730  cefTRIAXone (ROCEPHIN) 2 g in sodium chloride 0.9 % 100 mL IVPB  Status:  Discontinued     2 g 200 mL/hr over 30 Minutes Intravenous Every 24 hours 09/13/17 1649 09/14/17  0731       V. Leia Alf, M.D. [facility] [place] Office: [phone] Pager:  [phone]

## 2017-09-23 NOTE — Progress Notes (Signed)
   Subjective: Mr. Bitting is feeling well today, he is free of chest pain or shortness of breath. He is able to move his feet comfortably and feels that the pain in his feet is under control. He has been eating and tolerating diet well.   Objective:  Vital signs in last 24 hours: Vitals:   09/23/17 0430 09/23/17 0815 09/23/17 0823 09/23/17 1317  BP: (!) 143/60   (!) 140/56  Pulse:    (!) 58  Resp: 18   18  Temp: 97.8 F (36.6 C) 98.7 F (37.1 C)  97.8 F (36.6 C)  TempSrc: Oral Oral  Oral  SpO2: 94% 96% 95% 96%  Weight: 204 lb (92.5 kg)     Height:       General: well appearing, no acute distress  Cardiac: irregular rhythm, normal rate, normal S1 and S2, no murmur appreciated, posterior tibial pulses palpable in both feet and the legs feel warm  Pulm: normal work of breathing, not on nasal canula, speaking full sentences, lungs clear to auscultation bilateral  GI: the abdomen is soft, non tender, non distended   Assessment/Plan:  Principal Problem:   MRSA bacteremia Active Problems:   Diabetes (HCC)   Chronic diastolic CHF (congestive heart failure) (HCC)   Gangrene of bilateral great toes (HCC)   Urinary retention  Gangrene of bilateral first digits of the lower extremities: Patient underwent bilateral amputation of his great toes and ballooning of the right PosteriorTibial artery on 6/27.  -- can weight bear on heels and midfoot, non weight bearing on great toe area, plan is to discharge to [facility] SNF to complete IV vanc and PT  -- Fentanyl 12.54mcg Q2 hours PRN -- Postop anemia - One Unit PRBC's ordered yesterday, hemoglobin improved today   MRSA: Observed on Culture routinely taken at admission.Repeat Bl Cx w/ gram positive coccion 09/15/2017. --Will need to complete a14day course of vancomycin treatmentfollowing his first set of clear cultures ( drawn 6/23)stop date on 09/29/2017 --Repeat Cx'son 6/23 negativeto date --Repeat Cx's on 6/25negative to  date  Pulmonary Edema: Resolved  P-A-fib: Resume home eliquis 5 mg BID  Rate is controlled   Type 2 DM: A1c7.1%. On lantus 40 mg qAM 30 mg qPM at home. - Blood glucose 95-224 over the past 24 hours on lantus 30 and sliding scale  - start novolog 2 units TID WC   CAD: S/P LHC in 04/2017 with drug eluting stents placed. Will need to remain on Plavix. Denied ongoing chest pain.No acute issues. --Continued plavix 75mg  daily --Continue home atorvastatin  --Continue daily ASA 81mg   COPD: No acute issues. --Continue Spiriva --Continue albuterol PRN  HTN: Continue his home Amlodipine 10mg  daily, and Lisinopril 5mg  dailyand make the recommendation that they consider increasing the lisinopril at his outpatient visitshis blood pressure continues to be elevated.  Dispo: Anticipated discharge tomorrow   Ledell Noss, MD 09/23/2017, 6:09 PM Pager: [phone]

## 2017-09-24 DIAGNOSIS — Z7982 Long term (current) use of aspirin: Secondary | ICD-10-CM

## 2017-09-24 DIAGNOSIS — E119 Type 2 diabetes mellitus without complications: Secondary | ICD-10-CM

## 2017-09-24 DIAGNOSIS — Z9862 Peripheral vascular angioplasty status: Secondary | ICD-10-CM

## 2017-09-24 LAB — BASIC METABOLIC PANEL
ANION GAP: 5 (ref 5–15)
BUN: 10 mg/dL (ref 8–23)
CHLORIDE: 105 mmol/L (ref 98–111)
CO2: 27 mmol/L (ref 22–32)
Calcium: 8.4 mg/dL — ABNORMAL LOW (ref 8.9–10.3)
Creatinine, Ser: 0.86 mg/dL (ref 0.61–1.24)
GFR calc Af Amer: >60 mL/min (ref >60)
GLUCOSE: 135 mg/dL — AB (ref 70–99)
Potassium: 3.5 mmol/L (ref 3.5–5.1)
Sodium: 137 mmol/L (ref 135–145)

## 2017-09-24 LAB — CULTURE, BLOOD (ROUTINE X 2)
CULTURE: NO GROWTH
Culture: NO GROWTH
Special Requests: ADEQUATE
Special Requests: ADEQUATE

## 2017-09-24 LAB — CBC
HEMATOCRIT: 29 % — AB (ref 39.0–52.0)
Hemoglobin: 8.9 g/dL — ABNORMAL LOW (ref 13.0–17.0)
MCH: 25.7 pg — ABNORMAL LOW (ref 26.0–34.0)
MCHC: 30.7 g/dL (ref 30.0–36.0)
MCV: 83.8 fL (ref 78.0–100.0)
Platelets: 258 10*3/uL (ref 150–400)
RBC: 3.46 MIL/uL — ABNORMAL LOW (ref 4.22–5.81)
RDW: 18 % — ABNORMAL HIGH (ref 11.5–15.5)
WBC: 7.9 10*3/uL (ref 4.0–10.5)

## 2017-09-24 LAB — GLUCOSE, CAPILLARY
GLUCOSE-CAPILLARY: 138 mg/dL — AB (ref 70–99)
GLUCOSE-CAPILLARY: 148 mg/dL — AB (ref 70–99)
GLUCOSE-CAPILLARY: 235 mg/dL — AB (ref 70–99)
GLUCOSE-CAPILLARY: 294 mg/dL — AB (ref 70–99)
Glucose-Capillary: 143 mg/dL — ABNORMAL HIGH (ref 70–99)
Glucose-Capillary: 222 mg/dL — ABNORMAL HIGH (ref 70–99)

## 2017-09-24 LAB — VANCOMYCIN, TROUGH: Vancomycin Tr: 20 ug/mL (ref 15–20)

## 2017-09-24 MED ORDER — VANCOMYCIN HCL IN DEXTROSE 1-5 GM/200ML-% IV SOLN
1000.0000 mg | Freq: Two times a day (BID) | INTRAVENOUS | Status: DC
  Administered 2017-09-24 – 2017-09-25 (×2): 1000 mg via INTRAVENOUS
  Filled 2017-09-24 (×2): qty 200

## 2017-09-24 NOTE — Progress Notes (Signed)
   Subjective: Patient was feeling better when seen this morning.  He denies any pain in his feet.  Remained afebrile.  Tolerating diet well. Patient has to be discharged with wound VAC which will become available tomorrow morning at his facility.  Objective:  Vital signs in last 24 hours: Vitals:   09/23/17 1317 09/23/17 2010 09/24/17 0345 09/24/17 0925  BP: (!) 140/56 (!) 129/92    Pulse: (!) 58 (!) 58    Resp: 18 (!) 26    Temp: 97.8 F (36.6 C) 98 F (36.7 C) 98.1 F (36.7 C)   TempSrc: Oral Oral Oral   SpO2: 96% 94% 97% 98%  Weight:   202 lb 4.8 oz (91.8 kg)   Height:       General.  Well-developed elderly man, in no acute distress. Lungs.  Clear bilaterally, no increased work of breathing. CV.  Regular rate and rhythm. Abdomen.  Soft, nontender, bowel sounds positive. Extremities.  Right foot dressing with some serous and bloody discharge, left foot dressing clean, bilaterally palpable posterior tibial  Pulses.  Assessment/Plan: 84 year old gentleman presented with bilateral gangrenous great toes, s/p amputations of bilateral first toes and revascularization with balloon in each PT arteries.  Found to have MRSA bacteremia with negative blood cultures since September 17, 2017.  Gangrene of bilateral first digits of the lower extremities: Patient underwent bilateral amputation of his great toes and ballooning of the right PosteriorTibial artery on 6/27.  Patient is weightbearing on heel and midfoot. Patient to be discharged to [facility] with wound VAC, which will become available tomorrow on Monday. Pain is well controlled, is not requiring any pain medicine.  Last dose of Tylenol was yesterday morning. Postoperative anemia is stable after 1 unit of packed RBCs. Fentanyl 12.5 MCG remained on board but patient did not require it for the past 3 days.  MRSA: Observed on Culture routinely taken at admission.Repeat Bl Cx w/ gram positive coccion 09/15/2017. Repeat blood  cultures on June 23,and 25th 2019 remain negative to-date. -Continue vancomycin for total of 14 days-end date September 29, 2017.  P-A-fib: Patient was in sinus rhythm with normal rate. -Continue home dose of Eliquis and metoprolol XL 100 mg daily.  Hypertension.  Pressure was within goal today. -Continue home dose of amlodipine 10 mg daily and lisinopril 5 mg daily.  Type 2 DM: A1c7.1%. On lantus 40 mg qAM 30 mg qPM at home. - Blood glucose 138-222 over the past 24 hours on lantus 30 and sliding scale. -Continue Lantus, sliding scale and 2 units with mealtime coverage.  CAD: S/P LHC in 04/2017 with drug eluting stents placed. Will need to remain on Plavix. Denied ongoing chest pain.No acute issues. -Continued plavix 75mg  daily -Continue home atorvastatin  -Continue daily ASA 81mg   COPD: No acute issues. -Continue Spiriva -Continue albuterol PRN  Dispo: Anticipated discharge in approximately 1 day(s).   Lorella Nimrod, MD 09/24/2017, 11:49 AM Pager: [phone]

## 2017-09-24 NOTE — Progress Notes (Signed)
Internal Medicine Attending:   I saw and examined the patient. I reviewed the resident's note and I agree with the resident's findings and plan as documented in the resident's note.  Patient feels well today with no new complaints.  Patient was initially admitted with gangrene of his bilateral first toes.  He is status post amputation of his great toes bilaterally and ballooning of his right posterior tibial artery on June 27.  Patient is stable for discharge to his facility today.  However, the facility will not have his wound VAC till tomorrow and so he will be discharged to his facility tomorrow.  His pain is well controlled currently.  His blood counts are stable.  No further work-up at this time.

## 2017-09-24 NOTE — Progress Notes (Signed)
Pharmacy Antibiotic Note  Mike Wright is a 84 y.o. male admitted on 09/13/2017 with bacteremia.  Pharmacy has been consulted for vancomycin dosing. Patient is in the hospital with MRSA bacteremia secondary to bilateral great toe ischemic gangrene. S/p amputation surgery 6/26. Renal function improving. Switching daptomycin to vancomycin. Vanc trough 20 last evening, will reduce the dose to prevent further accumulation.   Plan: Vancomycin 1000 mg IV every 12 hours. Goal trough 15-20. Continue for two weeks from negative culture (stop date: 7/5) Monitor renal function and vanc trough as neded.  Height: 5' 7.01" (170.2 cm) Weight: 202 lb 4.8 oz (91.8 kg) IBW/kg (Calculated) : 66.12  Temp (24hrs), Avg:97.9 F (36.6 C), Min:97.5 F (36.4 C), Max:98.1 F (36.7 C)  Recent Labs  Lab 09/20/17 0529 09/21/17 0340 09/21/17 0833 09/22/17 0315 09/22/17 2020 09/23/17 0500 09/23/17 2330 09/24/17 0400  WBC 9.1 9.4  --  10.0 8.7 8.5  --  7.9  CREATININE 0.81  --  0.90 0.81  --  0.80  --  0.86  VANCOTROUGH  --   --   --   --   --   --  20  --     Estimated Creatinine Clearance: 72.8 mL/min (by C-G formula based on SCr of 0.86 mg/dL).    Allergies  Allergen Reactions  . Codeine Shortness Of Breath  . Heparin Other (See Comments)    +HIT,  Severe bleeding   . Losartan Swelling    Per ENT UNSPECIFIED REACTION   . Other Other (See Comments)    Severe bleeding UNSPECIFIED AGENT   . Oxycodone Other (See Comments)    "Made me act out of my mind" Other reaction(s): Other (See Comments) Mental status changes hallucinations    Thank you for allowing pharmacy to be a part of this patient's care.  Jodean Lima Patrick Salemi 09/24/2017 2:17 PM

## 2017-09-24 NOTE — Progress Notes (Addendum)
  Progress Note    09/24/2017 10:01 AM 4 Days Post-Op  Subjective:  No new complaints   Vitals:   09/24/17 0345 09/24/17 0925  BP:    Pulse:    Resp:    Temp: 98.1 F (36.7 C)   SpO2: 97% 98%   Physical Exam: Lungs:  Non labored Incisions:  Dressing left in place GT amp sites; PTA by doppler bilaterally Abdomen:  Soft Neurologic: A&O  CBC    Component Value Date/Time   WBC 7.9 09/24/2017 0400   RBC 3.46 (L) 09/24/2017 0400   HGB 8.9 (L) 09/24/2017 0400   HGB 12.3 (L) 05/30/2017 1531   HCT 29.0 (L) 09/24/2017 0400   HCT 37.7 05/30/2017 1531   PLT 258 09/24/2017 0400   PLT 212 05/30/2017 1531   MCV 83.8 09/24/2017 0400   MCV 86 05/30/2017 1531   MCH 25.7 (L) 09/24/2017 0400   MCHC 30.7 09/24/2017 0400   RDW 18.0 (H) 09/24/2017 0400   RDW 16.0 (H) 05/30/2017 1531   LYMPHSABS 1.1 09/13/2017 1308   MONOABS 2.9 (H) 09/13/2017 1308   EOSABS 0.0 09/13/2017 1308   BASOSABS 0.1 09/13/2017 1308    BMET    Component Value Date/Time   NA 137 09/24/2017 0400   NA 139 05/30/2017 1531   K 3.5 09/24/2017 0400   CL 105 09/24/2017 0400   CO2 27 09/24/2017 0400   GLUCOSE 135 (H) 09/24/2017 0400   BUN 10 09/24/2017 0400   BUN 13 05/30/2017 1531   CREATININE 0.86 09/24/2017 0400   CALCIUM 8.4 (L) 09/24/2017 0400   GFRNONAA >60 09/24/2017 0400   GFRAA >60 09/24/2017 0400    INR    Component Value Date/Time   INR 1.20 09/20/2017 0529     Intake/Output Summary (Last 24 hours) at 09/24/2017 1001 Last data filed at 09/24/2017 7591 Gross per 24 hour  Intake 240 ml  Output 1400 ml  Net -1160 ml     Assessment/Plan:  83 y.o. male is s/p R PTA angioplasty with B GT amputations 4 Days Post-Op   Perfusing BLE with dopplerable peripheral signals Wound vac dressings ordered yesterday however not yet placed He will need home wound vac on both GT amp sites prior to discharge home Follow up in office in 2 weeks for wound check   Mike Ligas, PA-C [facility] [phone] 09/24/2017 10:01 AM  Agree with the above.  Needs vac on amputation sites.  [name]

## 2017-09-25 DIAGNOSIS — M6281 Muscle weakness (generalized): Secondary | ICD-10-CM | POA: Diagnosis not present

## 2017-09-25 DIAGNOSIS — D649 Anemia, unspecified: Secondary | ICD-10-CM | POA: Diagnosis not present

## 2017-09-25 DIAGNOSIS — Z89511 Acquired absence of right leg below knee: Secondary | ICD-10-CM | POA: Diagnosis not present

## 2017-09-25 DIAGNOSIS — I2510 Atherosclerotic heart disease of native coronary artery without angina pectoris: Secondary | ICD-10-CM | POA: Diagnosis not present

## 2017-09-25 DIAGNOSIS — E119 Type 2 diabetes mellitus without complications: Secondary | ICD-10-CM | POA: Diagnosis not present

## 2017-09-25 DIAGNOSIS — Z978 Presence of other specified devices: Secondary | ICD-10-CM

## 2017-09-25 DIAGNOSIS — I70262 Atherosclerosis of native arteries of extremities with gangrene, left leg: Secondary | ICD-10-CM | POA: Diagnosis not present

## 2017-09-25 DIAGNOSIS — Z885 Allergy status to narcotic agent status: Secondary | ICD-10-CM

## 2017-09-25 DIAGNOSIS — Z299 Encounter for prophylactic measures, unspecified: Secondary | ICD-10-CM | POA: Diagnosis not present

## 2017-09-25 DIAGNOSIS — D62 Acute posthemorrhagic anemia: Secondary | ICD-10-CM | POA: Diagnosis not present

## 2017-09-25 DIAGNOSIS — Z89411 Acquired absence of right great toe: Secondary | ICD-10-CM | POA: Diagnosis not present

## 2017-09-25 DIAGNOSIS — E1152 Type 2 diabetes mellitus with diabetic peripheral angiopathy with gangrene: Secondary | ICD-10-CM | POA: Diagnosis not present

## 2017-09-25 DIAGNOSIS — Z4781 Encounter for orthopedic aftercare following surgical amputation: Secondary | ICD-10-CM | POA: Diagnosis not present

## 2017-09-25 DIAGNOSIS — Z888 Allergy status to other drugs, medicaments and biological substances status: Secondary | ICD-10-CM

## 2017-09-25 DIAGNOSIS — J811 Chronic pulmonary edema: Secondary | ICD-10-CM | POA: Diagnosis not present

## 2017-09-25 DIAGNOSIS — I10 Essential (primary) hypertension: Secondary | ICD-10-CM | POA: Diagnosis not present

## 2017-09-25 DIAGNOSIS — B9562 Methicillin resistant Staphylococcus aureus infection as the cause of diseases classified elsewhere: Secondary | ICD-10-CM | POA: Diagnosis not present

## 2017-09-25 DIAGNOSIS — R2689 Other abnormalities of gait and mobility: Secondary | ICD-10-CM | POA: Diagnosis not present

## 2017-09-25 DIAGNOSIS — Z7401 Bed confinement status: Secondary | ICD-10-CM | POA: Diagnosis not present

## 2017-09-25 DIAGNOSIS — M2550 Pain in unspecified joint: Secondary | ICD-10-CM | POA: Diagnosis not present

## 2017-09-25 DIAGNOSIS — Z89412 Acquired absence of left great toe: Secondary | ICD-10-CM | POA: Diagnosis not present

## 2017-09-25 DIAGNOSIS — I480 Paroxysmal atrial fibrillation: Secondary | ICD-10-CM | POA: Diagnosis not present

## 2017-09-25 DIAGNOSIS — I96 Gangrene, not elsewhere classified: Secondary | ICD-10-CM | POA: Diagnosis not present

## 2017-09-25 DIAGNOSIS — R7881 Bacteremia: Secondary | ICD-10-CM | POA: Diagnosis not present

## 2017-09-25 LAB — BASIC METABOLIC PANEL
Anion gap: 10 (ref 5–15)
BUN: 15 mg/dL (ref 8–23)
CALCIUM: 8.9 mg/dL (ref 8.9–10.3)
CO2: 27 mmol/L (ref 22–32)
CREATININE: 0.97 mg/dL (ref 0.61–1.24)
Chloride: 101 mmol/L (ref 98–111)
GFR calc Af Amer: >60 mL/min (ref >60)
Glucose, Bld: 170 mg/dL — ABNORMAL HIGH (ref 70–99)
POTASSIUM: 4.1 mmol/L (ref 3.5–5.1)
SODIUM: 138 mmol/L (ref 135–145)

## 2017-09-25 LAB — GLUCOSE, CAPILLARY
GLUCOSE-CAPILLARY: 130 mg/dL — AB (ref 70–99)
Glucose-Capillary: 155 mg/dL — ABNORMAL HIGH (ref 70–99)
Glucose-Capillary: 208 mg/dL — ABNORMAL HIGH (ref 70–99)

## 2017-09-25 LAB — CBC
HCT: 29.1 % — ABNORMAL LOW (ref 39.0–52.0)
HEMOGLOBIN: 9 g/dL — AB (ref 13.0–17.0)
MCH: 26 pg (ref 26.0–34.0)
MCHC: 30.9 g/dL (ref 30.0–36.0)
MCV: 84.1 fL (ref 78.0–100.0)
PLATELETS: 258 10*3/uL (ref 150–400)
RBC: 3.46 MIL/uL — ABNORMAL LOW (ref 4.22–5.81)
RDW: 18.2 % — ABNORMAL HIGH (ref 11.5–15.5)
WBC: 10.6 10*3/uL — ABNORMAL HIGH (ref 4.0–10.5)

## 2017-09-25 MED ORDER — VANCOMYCIN IV (FOR PTA / DISCHARGE USE ONLY): 1250.0000 mg | Freq: Two times a day (BID) | INTRAVENOUS | 0 refills | Status: AC

## 2017-09-25 NOTE — Progress Notes (Signed)
S: When seen on rounds this morning Mike Wright had many questions concerning his transfer to a skilled nursing facility.  Otherwise, he was feeling well and looking forward to completing the remaining IV vancomycin course through July 5.  O: T 97.7, P 72, R 19, BP 128/71, O2 Sat 99%, Wt 197 Gen: well-developed, well-nourished, man lying comfortably bed in no acute distress. Legs: Bilateral feet bandaged postoperatively.There is some bloody drainage on the medial aspect of the right bandage.  CBC: WBC 10.6 (7.9), Hct 29.1, Plts 258 BMP: Cr 0.97, Glucose 170  A: [name] is an 84 year old man who initially presented with gangrene of his bilateral first toes secondary to peripheral vascular occlusive disease. He underwent amputation of his great toes bilaterally and ballooning of his right posterior tibial artery. His course was complicated by MRSA bacteremia that has subsequently cleared after initiation of vancomycin. His discharge was held 1 day secondary to the need for wound VAC placement. He is stable for discharge home today.  PLAN  1) Gangrene of the bilateral first toes status post amputation: Revascularization of the right posterior tibial artery was undertaken. Wound VAC was placed today prior to transfer to a skilled nursing facility. Follow-up will be with the vascular surgery clinic. We will continue with the IV vancomycin through July 5.  2) Disposition: He is stable for transfer to his preferred skilled nursing facility to complete his antibiotic course as noted above. Follow-up will be with vascular surgery as well as his primary care provider.

## 2017-09-25 NOTE — Care Management Note (Signed)
Case Management Note Marvetta Gibbons RN, BSN Unit 4E-Case Manager [phone]  Patient Details  Name: Mike Wright MRN: [id number] Date of Birth: 09-27-1933  Subjective/Objective:   Pt admitted with ischemic feet, s/p aortogram with angioplasty per vascular.  Plan for TEE on 6/24 to check for vegetation- pt currently on Dapto IV for abx tx.                 Action/Plan: PTA Pt lived at home with spouse- PT following for dispo recommendations HHPT prior to surgery/SNF following any surgery interventions. - CM will follow for transition of care needs- ?need for home IV abx.   Expected Discharge Date:  09/25/17               Expected Discharge Plan:  Skilled Nursing Facility  In-House Referral:  Clinical Social Work  Discharge planning Services  CM Consult  Post Acute Care Choice:  Durable Medical Equipment Choice offered to:  Patient  DME Arranged:  Vac DME Agency:     HH Arranged:    [place] Agency:     Status of Service:  Completed, signed off  If discussed at [employer] of Stay Meetings, dates discussed:    Discharge Disposition: skilled facility   Additional Comments:  09/25/17- 1450- Erian Rosengren RN, CM- pt will need bil wound vAC at toe amputation sites, and IV abx through July 5 (IV vanc)- CSW following for SNF placement- Plan is to d/c to [facility], Romeo Rabon, RN 09/25/2017, 2:50 PM

## 2017-09-25 NOTE — Consult Note (Signed)
[place] Nurse wound consult note Reason for Consult: Bilateral great toe amputations and application of NPWT (VAC) therapy.  Will need two devices for safety.  A Y-connector would not be safe. Wound type: Surgical Pressure Injury POA: NA Measurement:Left foot:  Great toe amputations site:  0.4 cm x 7 cm with tunneling at proximal and distal end, extends 2 cm  Right great toe site:  0.4 cm x 5.8 cm x 0.5 cm  Wound bed: Ruddy red with 25% tan and cauterized tissue Drainage (amount, consistency, odor) minimal serosanguinous  No odor Periwound: Intact sutures  Protected with hydrocolloid strips Dressing procedure/placement/frequency:  Bilateral great toe amputation sites:   Black foam to wound bed, filling dead space.  Protect periwound with hydrocolloid strips.  Bridge to dorsal foot.  Change Mon/Wed/Fri WOC team will follow.  Domenic Moras RN BSN [place] Pager [phone]

## 2017-09-25 NOTE — Progress Notes (Signed)
Clinical Social Worker facilitated patient discharge including contacting patient family and facility to confirm patient discharge plans.  Clinical information faxed to facility and family agreeable with plan.  CSW arranged ambulance transport via [facility] to [facility] .  RN to call [phone] and ask for Nevin Bloodgood (pt will go in room 150 [place]) for  report prior to discharge.  Clinical Social Worker will sign off for now as social work intervention is no longer needed. Please consult [place] again if new need arises.  Rhea Pink, MSW, [place]

## 2017-09-25 NOTE — Progress Notes (Signed)
[facility] [place]  Subjective  - Doing well over all.   Objective 132/71 66 97.7 F (36.5 C) (Oral) 19 99%  Intake/Output Summary (Last 24 hours) at 09/25/2017 0731 Last data filed at 09/25/2017 0415 Gross per 24 hour  Intake 1160 ml  Output 1100 ml  Net 60 ml    Doppler B PT signals Dressings in place B feet, active range of motion intact, no sensation to lite touch Heart Afib Lungs non labored breathing Gen no distress   Assessment/Planning: 84 y.o. male is s/p R PTA angioplasty with B GT amputations 5 Days Post-Op   Plan for B GT wound vac Ordered home vac and home health for discharge planning F/U in our office in 2 weeks for wound check. Blood cultures no growth  Roxy Horseman 09/25/2017 7:31 AM --  Laboratory Lab Results: Recent Labs    09/24/17 0400 09/25/17 0425  WBC 7.9 10.6*  HGB 8.9* 9.0*  HCT 29.0* 29.1*  PLT 258 258   BMET Recent Labs    09/23/17 0500 09/24/17 0400  NA 138 137  K 3.6 3.5  CL 106 105  CO2 25 27  GLUCOSE 95 135*  BUN 10 10  CREATININE 0.80 0.86  CALCIUM 8.3* 8.4*    COAG Lab Results  Component Value Date   INR 1.20 09/20/2017   INR 1.54 09/18/2017   INR 1.32 09/15/2017   No results found for: PTT

## 2017-09-26 ENCOUNTER — Ambulatory Visit: Payer: Medicare HMO | Admitting: Cardiology

## 2017-09-26 ENCOUNTER — Ambulatory Visit (HOSPITAL_COMMUNITY): Admission: RE | Admit: 2017-09-26 | Payer: Medicare HMO | Source: Ambulatory Visit | Admitting: Surgery

## 2017-09-26 ENCOUNTER — Encounter (HOSPITAL_COMMUNITY): Admission: RE | Payer: Self-pay | Source: Ambulatory Visit

## 2017-09-26 SURGERY — LOWER EXTREMITY ANGIOGRAPHY
Anesthesia: LOCAL

## 2017-09-27 DIAGNOSIS — Z89511 Acquired absence of right leg below knee: Secondary | ICD-10-CM | POA: Diagnosis not present

## 2017-09-27 DIAGNOSIS — I70262 Atherosclerosis of native arteries of extremities with gangrene, left leg: Secondary | ICD-10-CM | POA: Diagnosis not present

## 2017-10-09 ENCOUNTER — Ambulatory Visit (INDEPENDENT_AMBULATORY_CARE_PROVIDER_SITE_OTHER): Payer: Self-pay | Admitting: Surgery

## 2017-10-09 ENCOUNTER — Other Ambulatory Visit: Payer: Self-pay

## 2017-10-09 ENCOUNTER — Encounter: Payer: Self-pay | Admitting: Surgery

## 2017-10-09 ENCOUNTER — Other Ambulatory Visit: Payer: Self-pay | Admitting: Surgery

## 2017-10-09 VITALS — BP 129/78 | HR 78 | Temp 98.5°F | Resp 16 | Ht 67.0 in | Wt 211.0 lb

## 2017-10-09 DIAGNOSIS — I96 Gangrene, not elsewhere classified: Secondary | ICD-10-CM

## 2017-10-09 NOTE — Progress Notes (Signed)
Patient name: Mike Wright MRN: [id number] DOB: [birth year] Sex: male  REASON FOR VISIT:     post op  HISTORY OF PRESENT ILLNESS:   Mike Wright is a 84 y.o. male who returns today for follow-up.  He initially presented with bilateral ischemic great toes.  On 09/15/2017 he underwent abdominal aortogram.  He was found to have a short segment occlusion of the peroneal artery there was crossed and treated with a 3 mm balloon.  There was also multiple high-grade lesions greater than 90% throughout the posterior tibial artery across the ankle that were treated with 2 mm and 3 mm balloon.  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  He is here today for follow-up.  He was discharged home on vancomycin via a PICC line.  His infusion stopped on July 5.  He bumped his toe approximately 3 days ago.  He has not had any fevers or chills but does have some redness on his foot  CURRENT MEDICATIONS:    Current Outpatient Medications  Medication Sig Dispense Refill  . acetaminophen (TYLENOL) 500 MG tablet Take 500 mg by mouth as needed for mild pain or headache.    . albuterol (PROVENTIL) (2.5 MG/3ML) 0.083% nebulizer solution Inhale 3 mLs into the lungs every 4 (four) hours as needed for wheezing or shortness of breath. 75 mL 12  . amLODipine (NORVASC) 10 MG tablet Take 1 tablet (10 mg total) by mouth daily. 90 tablet 3  . apixaban (ELIQUIS) 5 MG TABS tablet Take 1 tablet (5 mg total) by mouth 2 (two) times daily. 180 tablet 3  . atorvastatin (LIPITOR) 80 MG tablet Take 1 tablet (80 mg total) by mouth every evening. 30 tablet 0  . Cholecalciferol (D3-1000 PO) Take 2,000 Units by mouth daily.    . clopidogrel (PLAVIX) 75 MG tablet Take 1 tablet (75 mg total) by mouth daily. 30 tablet 0  .  Cyanocobalamin (B-12) 2500 MCG TABS Take 2,500 mcg by mouth daily.     . finasteride (PROSCAR) 5 MG tablet Take 5 mg by mouth daily.     . furosemide (LASIX) 20 MG tablet TAKE 1 TABLET DAILY AS NEEDED 90 tablet 0  . insulin glargine (LANTUS) 100 UNIT/ML injection Inject 30-40 Units into the skin See admin instructions. Inject 40 units SQ in the morning and inject 30 units SQ at bedtime    . levalbuterol (XOPENEX HFA) 45 MCG/ACT inhaler Inhale 1 puff into the lungs every 6 (six) hours as needed for wheezing or shortness of breath. 1 Inhaler 0  . meclizine (ANTIVERT) 12.5 MG tablet Take 1 tablet (12.5 mg total) by mouth 3 (three) times daily as needed for dizziness. 30 tablet 0  . metoprolol succinate (TOPROL-XL) 100 MG 24 hr tablet TAKE 1/2 TABLET EVERY DAY 45 tablet 1  . nitroGLYCERIN (NITROSTAT) 0.4 MG SL tablet PLACE 1 TABLET (0.4 MG TOTAL) UNDER THE TONGUE EVERY 5  MINUTES AS NEEDED FOR CHEST PAIN. 25 tablet 3  . pantoprazole (PROTONIX) 40 MG tablet Take 1 tablet (40 mg total) by mouth daily. To protect stomach while taking multiple blood thinners. 30 tablet 0  . psyllium (METAMUCIL) 58.6 % packet Take 1 packet by mouth daily.    [name] tiotropium (SPIRIVA) 18 MCG inhalation capsule Place 1 capsule (18 mcg total) into inhaler and  inhale daily. 30 capsule 12  . traZODone (DESYREL) 100 MG tablet Take 100 mg by mouth at bedtime.     [name] lisinopril (PRINIVIL,ZESTRIL) 5 MG tablet Take 1 tablet (5 mg total) by mouth daily. 90 tablet 1   No current facility-administered medications for this visit.     REVIEW OF SYSTEMS:   [X]  denotes positive finding, [ ]  denotes negative finding Cardiac  Comments:  Chest pain or chest pressure:    Shortness of breath upon exertion:    Short of breath when lying flat:    Irregular heart rhythm:    Constitutional    Fever or chills:      PHYSICAL EXAM:   Vitals:   10/09/17 1318  BP: 129/78  Pulse: 78  Resp: 16  Temp: 98.5 F (36.9 C)  TempSrc: Oral  SpO2:  96%  Weight: 211 lb (95.7 kg)  Height: 5\' 7"  (1.702 m)    GENERAL: The patient is a well-nourished male, in no acute distress. The vital signs are documented above. CARDIOVASCULAR: There is a regular rate and rhythm. PULMONARY: Non-labored respirations Bilateral toe amputation site remains open with granulation tissue at the base.  There is more drainage on the left.  There is also some fibrinous material on the left.  There is blanching cellulitis going on to the dorsum of the left foot originating at the base of the third toe.    STUDIES:   None   MEDICAL ISSUES:   Status post bilateral great toe amputations following percutaneous revascularization.  The wounds appear to be healing appropriately.  Sutures remain in place.  I did not remove these today but will remove them when he returns in 1 week.  The patient does have cellulitis on the dorsum of the left foot.  I contemplated admitting him to the hospital for IV antibiotics, however he still has his PICC line in place, and therefore I will start him on vancomycin through his PICC line at his rehab facility.  I have told the wife and patient to examine the wound daily and if the cellulitis is not significantly better within 2 days to contact me and I will get him admitted to the hospital for antibiotics and further work-up.  [name], MD [facility] [phone] Pager [phone]

## 2017-10-10 ENCOUNTER — Telehealth: Payer: Self-pay | Admitting: *Deleted

## 2017-10-10 ENCOUNTER — Encounter: Payer: Self-pay | Admitting: Internal Medicine

## 2017-10-10 NOTE — Telephone Encounter (Signed)
Call from [place] at [facility]. Patient is pending discharge from facility on 10/15/17 and wanting order to change Vancomycin from IV to PO. Informed that patient will need to complete antibiotic order as written IV ONLY and patient will need to stay to complete therapy. Home Health is available for once daily treatment. I will need to Call Dr. Trula Slade for any change. Per Dr. Trula Slade "Due to patient's condition, send patient to [facility] to be admitted if unable to provide treatment as ordered." Message relayed to [facility].

## 2017-10-16 ENCOUNTER — Encounter: Payer: Self-pay | Admitting: Surgery

## 2017-10-16 ENCOUNTER — Other Ambulatory Visit: Payer: Self-pay

## 2017-10-16 ENCOUNTER — Ambulatory Visit (INDEPENDENT_AMBULATORY_CARE_PROVIDER_SITE_OTHER): Payer: Self-pay | Admitting: Surgery

## 2017-10-16 VITALS — BP 138/80 | HR 76 | Temp 97.2°F | Resp 16 | Ht 67.0 in | Wt 213.0 lb

## 2017-10-16 DIAGNOSIS — I96 Gangrene, not elsewhere classified: Secondary | ICD-10-CM

## 2017-10-16 NOTE — Progress Notes (Signed)
Patient name: Mike Wright MRN: [id number] DOB: 04/19/1933 Sex: male  REASON FOR VISIT:     follow up  HISTORY OF PRESENT ILLNESS:   Mike Wright is a 84 y.o. male who returns today for follow-up.  He initially presented with bilateral ischemic great toes.  On 09/15/2017 he underwent abdominal aortogram.  He was found to have a short segment occlusion of the peroneal artery there was crossed and treated with a 3 mm balloon.  There was also multiple high-grade lesions greater than 90% throughout the posterior tibial artery across the ankle that were treated with 2 mm and 3 mm balloon.  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  He is here today for follow-up.  He was discharged home on vancomycin via a PICC line.  His infusion stopped on July 5.  He bumped his toe approximately 3 days ago.  He has not had any fevers or chills but does have some redness on his foot a week later and when I saw him he had redness on his foot and so I restarted his vancomycin.  He is back today for wound check    CURRENT MEDICATIONS:    Current Outpatient Medications  Medication Sig Dispense Refill  . acetaminophen (TYLENOL) 500 MG tablet Take 500 mg by mouth as needed for mild pain or headache.    . albuterol (PROVENTIL) (2.5 MG/3ML) 0.083% nebulizer solution Inhale 3 mLs into the lungs every 4 (four) hours as needed for wheezing or shortness of breath. 75 mL 12  . amLODipine (NORVASC) 10 MG tablet Take 1 tablet (10 mg total) by mouth daily. 90 tablet 3  . apixaban (ELIQUIS) 5 MG TABS tablet Take 1 tablet (5 mg total) by mouth 2 (two) times daily. 180 tablet 3  . atorvastatin (LIPITOR) 80 MG tablet Take 1 tablet (80 mg total) by mouth every evening. 30 tablet 0  . Cholecalciferol (D3-1000 PO)  Take 2,000 Units by mouth daily.    . clopidogrel (PLAVIX) 75 MG tablet Take 1 tablet (75 mg total) by mouth daily. 30 tablet 0  . Cyanocobalamin (B-12) 2500 MCG TABS Take 2,500 mcg by mouth daily.     . finasteride (PROSCAR) 5 MG tablet Take 5 mg by mouth daily.     . furosemide (LASIX) 20 MG tablet TAKE 1 TABLET DAILY AS NEEDED 90 tablet 0  . insulin glargine (LANTUS) 100 UNIT/ML injection Inject 30-40 Units into the skin See admin instructions. Inject 40 units SQ in the morning and inject 30 units SQ at bedtime    . levalbuterol (XOPENEX HFA) 45 MCG/ACT inhaler Inhale 1 puff into the lungs every 6 (six) hours as needed for wheezing or shortness of breath. 1 Inhaler 0  . lisinopril (PRINIVIL,ZESTRIL) 5 MG tablet Take 1 tablet (5 mg total) by mouth daily. 90 tablet 1  . meclizine (ANTIVERT) 12.5 MG tablet Take 1 tablet (12.5 mg total) by mouth 3 (three) times daily as needed for dizziness. 30 tablet 0  . metoprolol succinate (TOPROL-XL) 100 MG 24 hr tablet TAKE 1/2 TABLET EVERY DAY 45 tablet 1  . nitroGLYCERIN (NITROSTAT) 0.4 MG SL tablet PLACE 1 TABLET (0.4 MG TOTAL) UNDER THE TONGUE EVERY 5  MINUTES AS NEEDED FOR CHEST PAIN. 25 tablet 3  . pantoprazole (PROTONIX) 40 MG tablet Take 1 tablet (  40 mg total) by mouth daily. To protect stomach while taking multiple blood thinners. 30 tablet 0  . psyllium (METAMUCIL) 58.6 % packet Take 1 packet by mouth daily.    [name] tiotropium (SPIRIVA) 18 MCG inhalation capsule Place 1 capsule (18 mcg total) into inhaler and inhale daily. 30 capsule 12  . traZODone (DESYREL) 100 MG tablet Take 100 mg by mouth at bedtime.      No current facility-administered medications for this visit.     REVIEW OF SYSTEMS:   [X]  denotes positive finding, [ ]  denotes negative finding Cardiac  Comments:  Chest pain or chest pressure:    Shortness of breath upon exertion:    Short of breath when lying flat:    Irregular heart rhythm:    Constitutional    Fever or chills:       PHYSICAL EXAM:   There were no vitals filed for this visit.  GENERAL: The patient is a well-nourished male, in no acute distress. The vital signs are documented above. CARDIOVASCULAR: There is a regular rate and rhythm. PULMONARY: Non-labored respirations Bilateral great toe amputation sites have approximately a 1.5 x 1.5 cm wound with pink granulation tissue.  The erythema on the dorsum of the foot has significantly decreased.  STUDIES:   None   MEDICAL ISSUES:   I am recommending that we continue the IV vancomycin for at least another 2 weeks. His sutures will be removed today Continue with wet-to-dry dressing changes Follow-up in 2 weeks We discussed the possibility of enrollment into the Hemostem[employer] study.  His hemoglobin is on the low side, and his wounds are bilateral so I am not sure he will meet the exclusion criteria but he is interested.  [name], MD [facility] [phone] Pager [phone]

## 2017-10-18 DIAGNOSIS — Z299 Encounter for prophylactic measures, unspecified: Secondary | ICD-10-CM | POA: Diagnosis not present

## 2017-10-19 DIAGNOSIS — I70261 Atherosclerosis of native arteries of extremities with gangrene, right leg: Secondary | ICD-10-CM | POA: Diagnosis not present

## 2017-10-19 DIAGNOSIS — R7881 Bacteremia: Secondary | ICD-10-CM | POA: Diagnosis not present

## 2017-10-19 DIAGNOSIS — Z89412 Acquired absence of left great toe: Secondary | ICD-10-CM | POA: Diagnosis not present

## 2017-10-19 DIAGNOSIS — Z452 Encounter for adjustment and management of vascular access device: Secondary | ICD-10-CM | POA: Diagnosis not present

## 2017-10-19 DIAGNOSIS — Z792 Long term (current) use of antibiotics: Secondary | ICD-10-CM | POA: Diagnosis not present

## 2017-10-19 DIAGNOSIS — Z4781 Encounter for orthopedic aftercare following surgical amputation: Secondary | ICD-10-CM | POA: Diagnosis not present

## 2017-10-19 DIAGNOSIS — Z5181 Encounter for therapeutic drug level monitoring: Secondary | ICD-10-CM | POA: Diagnosis not present

## 2017-10-19 DIAGNOSIS — A4902 Methicillin resistant Staphylococcus aureus infection, unspecified site: Secondary | ICD-10-CM | POA: Diagnosis not present

## 2017-10-19 DIAGNOSIS — Z89411 Acquired absence of right great toe: Secondary | ICD-10-CM | POA: Diagnosis not present

## 2017-10-19 DIAGNOSIS — I96 Gangrene, not elsewhere classified: Secondary | ICD-10-CM | POA: Diagnosis not present

## 2017-10-19 DIAGNOSIS — I70262 Atherosclerosis of native arteries of extremities with gangrene, left leg: Secondary | ICD-10-CM | POA: Diagnosis not present

## 2017-10-19 DIAGNOSIS — I5032 Chronic diastolic (congestive) heart failure: Secondary | ICD-10-CM | POA: Diagnosis not present

## 2017-10-19 DIAGNOSIS — E1151 Type 2 diabetes mellitus with diabetic peripheral angiopathy without gangrene: Secondary | ICD-10-CM | POA: Diagnosis not present

## 2017-10-19 DIAGNOSIS — B9562 Methicillin resistant Staphylococcus aureus infection as the cause of diseases classified elsewhere: Secondary | ICD-10-CM | POA: Diagnosis not present

## 2017-10-23 DIAGNOSIS — Z4781 Encounter for orthopedic aftercare following surgical amputation: Secondary | ICD-10-CM | POA: Diagnosis not present

## 2017-10-23 DIAGNOSIS — Z792 Long term (current) use of antibiotics: Secondary | ICD-10-CM | POA: Diagnosis not present

## 2017-10-23 DIAGNOSIS — E1151 Type 2 diabetes mellitus with diabetic peripheral angiopathy without gangrene: Secondary | ICD-10-CM | POA: Diagnosis not present

## 2017-10-23 DIAGNOSIS — Z5181 Encounter for therapeutic drug level monitoring: Secondary | ICD-10-CM | POA: Diagnosis not present

## 2017-10-23 DIAGNOSIS — R7881 Bacteremia: Secondary | ICD-10-CM | POA: Diagnosis not present

## 2017-10-23 DIAGNOSIS — I70261 Atherosclerosis of native arteries of extremities with gangrene, right leg: Secondary | ICD-10-CM | POA: Diagnosis not present

## 2017-10-23 DIAGNOSIS — I70262 Atherosclerosis of native arteries of extremities with gangrene, left leg: Secondary | ICD-10-CM | POA: Diagnosis not present

## 2017-10-23 DIAGNOSIS — Z452 Encounter for adjustment and management of vascular access device: Secondary | ICD-10-CM | POA: Diagnosis not present

## 2017-10-23 DIAGNOSIS — B9562 Methicillin resistant Staphylococcus aureus infection as the cause of diseases classified elsewhere: Secondary | ICD-10-CM | POA: Diagnosis not present

## 2017-10-24 DIAGNOSIS — E1151 Type 2 diabetes mellitus with diabetic peripheral angiopathy without gangrene: Secondary | ICD-10-CM | POA: Diagnosis not present

## 2017-10-24 DIAGNOSIS — Z4781 Encounter for orthopedic aftercare following surgical amputation: Secondary | ICD-10-CM | POA: Diagnosis not present

## 2017-10-24 DIAGNOSIS — I70261 Atherosclerosis of native arteries of extremities with gangrene, right leg: Secondary | ICD-10-CM | POA: Diagnosis not present

## 2017-10-24 DIAGNOSIS — R7881 Bacteremia: Secondary | ICD-10-CM | POA: Diagnosis not present

## 2017-10-24 DIAGNOSIS — I70262 Atherosclerosis of native arteries of extremities with gangrene, left leg: Secondary | ICD-10-CM | POA: Diagnosis not present

## 2017-10-24 DIAGNOSIS — Z792 Long term (current) use of antibiotics: Secondary | ICD-10-CM | POA: Diagnosis not present

## 2017-10-24 DIAGNOSIS — B9562 Methicillin resistant Staphylococcus aureus infection as the cause of diseases classified elsewhere: Secondary | ICD-10-CM | POA: Diagnosis not present

## 2017-10-24 DIAGNOSIS — Z5181 Encounter for therapeutic drug level monitoring: Secondary | ICD-10-CM | POA: Diagnosis not present

## 2017-10-24 DIAGNOSIS — Z452 Encounter for adjustment and management of vascular access device: Secondary | ICD-10-CM | POA: Diagnosis not present

## 2017-10-25 DIAGNOSIS — Z89619 Acquired absence of unspecified leg above knee: Secondary | ICD-10-CM | POA: Diagnosis not present

## 2017-10-25 DIAGNOSIS — I10 Essential (primary) hypertension: Secondary | ICD-10-CM | POA: Diagnosis not present

## 2017-10-25 DIAGNOSIS — E1169 Type 2 diabetes mellitus with other specified complication: Secondary | ICD-10-CM | POA: Diagnosis not present

## 2017-10-25 DIAGNOSIS — Z6832 Body mass index (BMI) 32.0-32.9, adult: Secondary | ICD-10-CM | POA: Diagnosis not present

## 2017-10-25 DIAGNOSIS — I96 Gangrene, not elsewhere classified: Secondary | ICD-10-CM | POA: Diagnosis not present

## 2017-10-25 DIAGNOSIS — I509 Heart failure, unspecified: Secondary | ICD-10-CM | POA: Diagnosis not present

## 2017-10-25 DIAGNOSIS — Z299 Encounter for prophylactic measures, unspecified: Secondary | ICD-10-CM | POA: Diagnosis not present

## 2017-10-26 DIAGNOSIS — E1151 Type 2 diabetes mellitus with diabetic peripheral angiopathy without gangrene: Secondary | ICD-10-CM | POA: Diagnosis not present

## 2017-10-26 DIAGNOSIS — I70262 Atherosclerosis of native arteries of extremities with gangrene, left leg: Secondary | ICD-10-CM | POA: Diagnosis not present

## 2017-10-26 DIAGNOSIS — I70261 Atherosclerosis of native arteries of extremities with gangrene, right leg: Secondary | ICD-10-CM | POA: Diagnosis not present

## 2017-10-26 DIAGNOSIS — Z5181 Encounter for therapeutic drug level monitoring: Secondary | ICD-10-CM | POA: Diagnosis not present

## 2017-10-26 DIAGNOSIS — R7889 Finding of other specified substances, not normally found in blood: Secondary | ICD-10-CM | POA: Diagnosis not present

## 2017-10-26 DIAGNOSIS — R7881 Bacteremia: Secondary | ICD-10-CM | POA: Diagnosis not present

## 2017-10-26 DIAGNOSIS — Z4781 Encounter for orthopedic aftercare following surgical amputation: Secondary | ICD-10-CM | POA: Diagnosis not present

## 2017-10-26 DIAGNOSIS — Z452 Encounter for adjustment and management of vascular access device: Secondary | ICD-10-CM | POA: Diagnosis not present

## 2017-10-26 DIAGNOSIS — B9562 Methicillin resistant Staphylococcus aureus infection as the cause of diseases classified elsewhere: Secondary | ICD-10-CM | POA: Diagnosis not present

## 2017-10-26 DIAGNOSIS — Z792 Long term (current) use of antibiotics: Secondary | ICD-10-CM | POA: Diagnosis not present

## 2017-10-27 DIAGNOSIS — Z89411 Acquired absence of right great toe: Secondary | ICD-10-CM | POA: Diagnosis not present

## 2017-10-27 DIAGNOSIS — Z89412 Acquired absence of left great toe: Secondary | ICD-10-CM | POA: Diagnosis not present

## 2017-10-27 DIAGNOSIS — I96 Gangrene, not elsewhere classified: Secondary | ICD-10-CM | POA: Diagnosis not present

## 2017-10-27 DIAGNOSIS — I5032 Chronic diastolic (congestive) heart failure: Secondary | ICD-10-CM | POA: Diagnosis not present

## 2017-10-27 DIAGNOSIS — R7881 Bacteremia: Secondary | ICD-10-CM | POA: Diagnosis not present

## 2017-10-27 DIAGNOSIS — A4902 Methicillin resistant Staphylococcus aureus infection, unspecified site: Secondary | ICD-10-CM | POA: Diagnosis not present

## 2017-10-30 ENCOUNTER — Other Ambulatory Visit: Payer: Self-pay

## 2017-10-30 ENCOUNTER — Ambulatory Visit (INDEPENDENT_AMBULATORY_CARE_PROVIDER_SITE_OTHER): Payer: Medicare HMO | Admitting: Family

## 2017-10-30 ENCOUNTER — Encounter: Payer: Self-pay | Admitting: Family

## 2017-10-30 VITALS — BP 142/75 | HR 69 | Temp 96.9°F | Resp 18 | Ht 67.0 in | Wt 215.0 lb

## 2017-10-30 DIAGNOSIS — I70262 Atherosclerosis of native arteries of extremities with gangrene, left leg: Secondary | ICD-10-CM | POA: Diagnosis not present

## 2017-10-30 DIAGNOSIS — E1151 Type 2 diabetes mellitus with diabetic peripheral angiopathy without gangrene: Secondary | ICD-10-CM | POA: Diagnosis not present

## 2017-10-30 DIAGNOSIS — R7881 Bacteremia: Secondary | ICD-10-CM | POA: Diagnosis not present

## 2017-10-30 DIAGNOSIS — Z792 Long term (current) use of antibiotics: Secondary | ICD-10-CM | POA: Diagnosis not present

## 2017-10-30 DIAGNOSIS — I779 Disorder of arteries and arterioles, unspecified: Secondary | ICD-10-CM

## 2017-10-30 DIAGNOSIS — Z4781 Encounter for orthopedic aftercare following surgical amputation: Secondary | ICD-10-CM | POA: Diagnosis not present

## 2017-10-30 DIAGNOSIS — I96 Gangrene, not elsewhere classified: Secondary | ICD-10-CM

## 2017-10-30 DIAGNOSIS — Z452 Encounter for adjustment and management of vascular access device: Secondary | ICD-10-CM | POA: Diagnosis not present

## 2017-10-30 DIAGNOSIS — Z5181 Encounter for therapeutic drug level monitoring: Secondary | ICD-10-CM | POA: Diagnosis not present

## 2017-10-30 DIAGNOSIS — B9562 Methicillin resistant Staphylococcus aureus infection as the cause of diseases classified elsewhere: Secondary | ICD-10-CM | POA: Diagnosis not present

## 2017-10-30 DIAGNOSIS — S98112A Complete traumatic amputation of left great toe, initial encounter: Secondary | ICD-10-CM

## 2017-10-30 DIAGNOSIS — Z89411 Acquired absence of right great toe: Secondary | ICD-10-CM

## 2017-10-30 DIAGNOSIS — I70261 Atherosclerosis of native arteries of extremities with gangrene, right leg: Secondary | ICD-10-CM | POA: Diagnosis not present

## 2017-10-30 MED ORDER — COLLAGENASE 250 UNIT/GM EX OINT: 1.0000 "application " | TOPICAL_OINTMENT | Freq: Every day | CUTANEOUS | 2 refills | Status: DC

## 2017-10-30 NOTE — Progress Notes (Signed)
Vitals:   10/30/17 1318  BP: (!) 143/69  Pulse: 70  Resp: 18  Temp: (!) 96.9 F (36.1 C)  TempSrc: Oral  SpO2: 98%  Weight: 215 lb (97.5 kg)  Height: 5\' 7"  (1.702 m)

## 2017-10-30 NOTE — Patient Instructions (Signed)

## 2017-10-30 NOTE — Progress Notes (Signed)
Postoperative Visit   History of Present Illness  Mike Wright is a 84 y.o. male who is s/p angioplasty of right posterior tibial artery and amputation of right and left great toe including metatarsal head on 09-20-17 by Dr. Trula Slade for bilateral great toe gangrene.  Findings: Excellent bleeding from the amputation site.  Diffusely diseased posterior tibial artery down across the ankle which was treated with angioplasty using a 2.5 mm balloon.  There were multiple areas of greater than 90% stenosis which were resolved after angioplasty to less than 10%.  The patient has previously undergone percutaneous revascularization of the left leg for bilateral great toe gangrene.  He was originally scheduled to have the right leg done on 09-26-17, however he has had persistent fevers with the most likely source being his toes and therefore toe amputation was recommended.  Prior to doing this he needed to have revascularization of the right leg.  He returns today for wounds check.    HH changes foot dressings Monday and Thursdays, wife and pt dot wet to dry NS dressing changes twice daily. He is also receiving home occupational and physical therapy.  He He has a PICC line right arm, and he and wife give him the vancomycin daily over an hour and a half, supplied by [facility].  Wife states she thinks the toe amputation sites are looking better.  He wears post op shoes.   He denies pain in his feet.  He denies fever or chills.   He takes Eliquis for atrial fib.  He had 3 cardiac stents placed in March, 2019.  The patient is able to complete their activities of daily living.     Diabetic: yes, A1C was 7.1 on 09-14-17 Tobacco use: former, quit in 1985, smoked x 30 years, 2 ppd    For VQI Use Only  PRE-ADM LIVING: Home  AMB STATUS: Ambulatory with Assistance   Past Medical History:  Diagnosis Date  . Anemia    a. mild, noted 04/2017.  [name] CAD in native artery    a. [name] 04/2017 s/p DES to D1, DES  to prox-mid LAD, DES to prox LAD overlapping the prior stent, LVEF 55-65%.   . Chronic diastolic CHF (congestive heart failure) ([place])   . Diabetic ulcer of toe ([place])   . DJD (degenerative joint disease) of cervical spine   . Essential hypertension, benign   . GERD (gastroesophageal reflux disease)   . Heart murmur   . History of hiatal hernia   . HIT (heparin-induced thrombocytopenia) ([place])   . Hypothyroidism   . Hypoxia    a. went home on home O2 04/2017.  [name] Insomnia   . Mixed hyperlipidemia   . PAD (peripheral artery disease) ([place])   . PAF (paroxysmal atrial fibrillation) ([place])   . PSVT (paroxysmal supraventricular tachycardia) ([place])   . PVD (peripheral vascular disease) ([place])   . Renal insufficiency   . 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  . Sinus bradycardia    a. HR 30s-40s in 04/2017 -> diltiazem stopped, metoprolol reduced.  . Sleep apnea    "chose not to order CPAP at this time" (05/18/2017)  . Type 2 diabetes mellitus ([place])   . Wheezing    a. suspected COPD 04/2017. Former tobacco x 40 years.    Past Surgical History:  Procedure Laterality Date  . ABDOMINAL AORTOGRAM W/LOWER EXTREMITY N/A 09/15/2017   Procedure: ABDOMINAL AORTOGRAM W/LOWER EXTREMITY;  Surgeon: Serafina Mitchell, MD;  Location:  [place] [facility];  Service: Cardiovascular;  Laterality: N/A;  . AMPUTATION Bilateral 09/20/2017   Procedure: BILATERAL GREAT TOE AMPUTATIONS INCLUDING METATARSAL HEADS;  Surgeon: Serafina Mitchell, MD;  Location: [facility] OR;  Service: Vascular;  Laterality: Bilateral;  . ANGIOPLASTY Right 09/20/2017   Procedure: ANGIOPLASTY RIGHT TIBIAL ARTERY;  Surgeon: Serafina Mitchell, MD;  Location: [place];  Service: Vascular;  Laterality: Right;  . APPENDECTOMY    . BACK SURGERY    . CARDIAC CATHETERIZATION  1980s; 2012;  . CATARACT EXTRACTION Left 2004  . COLONOSCOPY  2004   FLEISHMAN TICS  . CORONARY ANGIOPLASTY WITH STENT PLACEMENT  05/18/2017   "3 stents"  . CORONARY STENT INTERVENTION  N/A 05/18/2017   Procedure: CORONARY STENT INTERVENTION;  Surgeon: Jettie Booze, MD;  Location: [place] CV [facility];  Service: Cardiovascular;  Laterality: N/A;  . JOINT REPLACEMENT    . LEFT HEART CATH AND CORONARY ANGIOGRAPHY N/A 05/18/2017   Procedure: LEFT HEART CATH AND CORONARY ANGIOGRAPHY;  Surgeon: Jettie Booze, MD;  Location: [place] [facility];  Service: Cardiovascular;  Laterality: N/A;  . LOWER EXTREMITY ANGIOGRAM Right 09/20/2017   Procedure: RIGHT LOWER LEG  ANGIOGRAM;  Surgeon: Serafina Mitchell, MD;  Location: [place];  Service: Vascular;  Laterality: Right;  . LUMBAR FUSION  2002   L3, 4 L4, 5 L5 S1 Fused by Dr. Glenna Fellows  . PERIPHERAL VASCULAR BALLOON ANGIOPLASTY Left 09/15/2017   Procedure: PERIPHERAL VASCULAR BALLOON ANGIOPLASTY;  Surgeon: Serafina Mitchell, MD;  Location: [place] [facility];  Service: Cardiovascular;  Laterality: Left;  PTA of Peroneal & Posterior Tibial  . POSTERIOR LUMBAR FUSION    . RHINOPLASTY    . RIGHT HEART CATH N/A 05/18/2017   Procedure: RIGHT HEART CATH;  Surgeon: Jettie Booze, MD;  Location: [place] [facility];  Service: Cardiovascular;  Laterality: N/A;  . TEE WITHOUT CARDIOVERSION N/A 09/18/2017   Procedure: TRANSESOPHAGEAL ECHOCARDIOGRAM (TEE);  Surgeon: Fay Records, MD;  Location: [place];  Service: Cardiovascular;  Laterality: N/A;  . TOTAL KNEE ARTHROPLASTY Bilateral   . TRANSURETHRAL RESECTION OF PROSTATE  2001   [place]    Family History  Problem Relation Age of Onset  . Heart attack Father 57  . COPD Father   . COPD Mother   . Heart disease Mother   . Diabetes Mother   . Hypertension Sister   . CVA Sister   . Diabetes Sister   . Multiple sclerosis Sister     Social History   Socioeconomic History  . Marital status: Married    Spouse name: Not on file  . Number of children: Not on file  . Years of education: Not on file  . Highest education level: Not on file  Occupational History  . Not on file    Social Needs  . Financial resource strain: Not on file  . Food insecurity:    Worry: Not on file    Inability: Not on file  . Transportation needs:    Medical: Not on file    Non-medical: Not on file  Tobacco Use  . Smoking status: Former Smoker    Packs/day: 2.00    Years: 30.00    Pack years: 60.00    Types: Cigarettes    Start date: 03/28/1953    Last attempt to quit: 03/29/1983    Years since quitting: 34.6  . Smokeless tobacco: Never Used  Substance and Sexual Activity  . Alcohol use: Yes  Comment: 05/18/2017 'I drink a beer q yr"  . Drug use: No  . Sexual activity: Not on file  Lifestyle  . Physical activity:    Days per week: Not on file    Minutes per session: Not on file  . Stress: Not on file  Relationships  . Social connections:    Talks on phone: Not on file    Gets together: Not on file    Attends religious service: Not on file    Active member of club or organization: Not on file    Attends meetings of clubs or organizations: Not on file    Relationship status: Not on file  . Intimate partner violence:    Fear of current or ex partner: Not on file    Emotionally abused: Not on file    Physically abused: Not on file    Forced sexual activity: Not on file  Other Topics Concern  . Not on file  Social History Narrative  . Not on file    Allergies  Allergen Reactions  . Codeine Shortness Of Breath  . Heparin Other (See Comments)    +HIT,  Severe bleeding   . Losartan Swelling    Per ENT UNSPECIFIED REACTION   . Other Other (See Comments)    Severe bleeding UNSPECIFIED AGENT   . Oxycodone Other (See Comments)    "Made me act out of my mind" Other reaction(s): Other (See Comments) Mental status changes hallucinations    Current Outpatient Medications on File Prior to Visit  Medication Sig Dispense Refill  . acetaminophen (TYLENOL) 500 MG tablet Take 500 mg by mouth as needed for mild pain or headache.    . albuterol (PROVENTIL) (2.5 MG/3ML)  0.083% nebulizer solution Inhale 3 mLs into the lungs every 4 (four) hours as needed for wheezing or shortness of breath. 75 mL 12  . amLODipine (NORVASC) 10 MG tablet Take 1 tablet (10 mg total) by mouth daily. 90 tablet 3  . apixaban (ELIQUIS) 5 MG TABS tablet Take 1 tablet (5 mg total) by mouth 2 (two) times daily. 180 tablet 3  . atorvastatin (LIPITOR) 80 MG tablet Take 1 tablet (80 mg total) by mouth every evening. 30 tablet 0  . Cholecalciferol (D3-1000 PO) Take 2,000 Units by mouth daily.    . clopidogrel (PLAVIX) 75 MG tablet Take 1 tablet (75 mg total) by mouth daily. 30 tablet 0  . Cyanocobalamin (B-12) 2500 MCG TABS Take 2,500 mcg by mouth daily.     . finasteride (PROSCAR) 5 MG tablet Take 5 mg by mouth daily.     . furosemide (LASIX) 20 MG tablet TAKE 1 TABLET DAILY AS NEEDED 90 tablet 0  . insulin glargine (LANTUS) 100 UNIT/ML injection Inject 30-40 Units into the skin See admin instructions. Inject 40 units SQ in the morning and inject 30 units SQ at bedtime    . levalbuterol (XOPENEX HFA) 45 MCG/ACT inhaler Inhale 1 puff into the lungs every 6 (six) hours as needed for wheezing or shortness of breath. 1 Inhaler 0  . meclizine (ANTIVERT) 12.5 MG tablet Take 1 tablet (12.5 mg total) by mouth 3 (three) times daily as needed for dizziness. 30 tablet 0  . metoprolol succinate (TOPROL-XL) 100 MG 24 hr tablet TAKE 1/2 TABLET EVERY DAY 45 tablet 1  . nitroGLYCERIN (NITROSTAT) 0.4 MG SL tablet PLACE 1 TABLET (0.4 MG TOTAL) UNDER THE TONGUE EVERY 5  MINUTES AS NEEDED FOR CHEST PAIN. 25 tablet 3  . pantoprazole (  PROTONIX) 40 MG tablet Take 1 tablet (40 mg total) by mouth daily. To protect stomach while taking multiple blood thinners. 30 tablet 0  . psyllium (METAMUCIL) 58.6 % packet Take 1 packet by mouth daily.    [name] tiotropium (SPIRIVA) 18 MCG inhalation capsule Place 1 capsule (18 mcg total) into inhaler and inhale daily. 30 capsule 12  . traZODone (DESYREL) 100 MG tablet Take 100 mg by mouth  at bedtime.     [name] lisinopril (PRINIVIL,ZESTRIL) 5 MG tablet Take 1 tablet (5 mg total) by mouth daily. 90 tablet 1   No current facility-administered medications on file prior to visit.       Physical Examination  Vitals:   10/30/17 1318 10/30/17 1323  BP: (!) 143/69 (!) 142/75  Pulse: 70 69  Resp: 18   Temp: (!) 96.9 F (36.1 C)   TempSrc: Oral   SpO2: 98%   Weight: 215 lb (97.5 kg)   Height: 5\' 7"  (1.702 m)    Body mass index is 33.67 kg/m.  PHYSICAL EXAMINATION: General: The patient appears his stated age, elderly male.   HEENT:  No gross abnormalities Pulmonary: Respirations are non-labored Abdomen: Soft and non-tender. Musculoskeletal: There are no major deformities. Bilateral great toes are surgically absent.    Neurologic: No focal weakness or paresthesias are detected, is hard of hearing.  Skin: There are no ulcer or rashes noted. Contracting wounds of bilateral great toe amputation sites, with some fibrinous exudate in wound beds, left more so than right. Mild to moderate erythema at wound edges, see photo below.  Psychiatric: The patient has normal affect. Cardiovascular: There is a regular rate and rhythm without significant murmur appreciated.       Vascular: Vessel Right Left  Radial Palpable Palpable  Brachial Palpable Palpable  Aorta Not palpable N/A  Femoral 2+Palpable 2+Palpable  Popliteal Not palpable Not palpable  PT not Palpable, + brisk Doppler signal not Palpable, + brisk Doppler signal  DP not Palpable, + brisk Doppler signal not Palpable, + brisk Doppler signal    Medical Decision Making  Mike Wright is a 84 y.o. male who presents s/p  angioplasty of right posterior tibial artery and amputation of right and left great toe including metatarsal head on 09-20-17 by Dr. Trula Slade for bilateral great toe gangrene.   The patient has previously undergone percutaneous revascularization of the left leg for bilateral great toe gangrene.  He was  originally scheduled to have the right leg done on 09-26-17, however he has had persistent fevers with the most likely source being his toes and therefore toe amputation was recommended.  Prior to doing this he needed to have revascularization of the right leg.  He takes Eliquis for a hx of atrial fib. He takes a daily statin and Plavix.   Dr. Trula Slade spoke with pt and wife and examined pt.  Separate cultures obtained of right and left great toe amputation sites. Start Santyl dressing changes daily to both great toe amputation sites.  Continue vancomycin as prescribed.  Return in 2-3 weeks for wound check, no non invasive vascular testing, see Dr. Trula Slade.   I discussed in depth with the patient the nature of atherosclerosis, and emphasized the importance of maximal medical management including strict control of blood pressure, blood glucose, and lipid levels, obtaining regular exercise, and cessation of smoking.  The patient is aware that without maximal medical management the underlying atherosclerotic disease process will progress, limiting the benefit of any interventions.   Thank  you for allowing [place] to participate in this patient's care.  Clemon Chambers, RN, MSN, FNP-C [facility] [place] Office: [phone]  10/30/2017, 1:32 PM  Clinic MD: Trula Slade

## 2017-10-31 DIAGNOSIS — B9562 Methicillin resistant Staphylococcus aureus infection as the cause of diseases classified elsewhere: Secondary | ICD-10-CM | POA: Diagnosis not present

## 2017-10-31 DIAGNOSIS — I70261 Atherosclerosis of native arteries of extremities with gangrene, right leg: Secondary | ICD-10-CM | POA: Diagnosis not present

## 2017-10-31 DIAGNOSIS — E1159 Type 2 diabetes mellitus with other circulatory complications: Secondary | ICD-10-CM | POA: Diagnosis not present

## 2017-10-31 DIAGNOSIS — Z5181 Encounter for therapeutic drug level monitoring: Secondary | ICD-10-CM | POA: Diagnosis not present

## 2017-10-31 DIAGNOSIS — Z4781 Encounter for orthopedic aftercare following surgical amputation: Secondary | ICD-10-CM | POA: Diagnosis not present

## 2017-10-31 DIAGNOSIS — Z792 Long term (current) use of antibiotics: Secondary | ICD-10-CM | POA: Diagnosis not present

## 2017-10-31 DIAGNOSIS — I70262 Atherosclerosis of native arteries of extremities with gangrene, left leg: Secondary | ICD-10-CM | POA: Diagnosis not present

## 2017-10-31 DIAGNOSIS — Z452 Encounter for adjustment and management of vascular access device: Secondary | ICD-10-CM | POA: Diagnosis not present

## 2017-10-31 DIAGNOSIS — E1151 Type 2 diabetes mellitus with diabetic peripheral angiopathy without gangrene: Secondary | ICD-10-CM | POA: Diagnosis not present

## 2017-10-31 DIAGNOSIS — R7881 Bacteremia: Secondary | ICD-10-CM | POA: Diagnosis not present

## 2017-11-01 DIAGNOSIS — B9562 Methicillin resistant Staphylococcus aureus infection as the cause of diseases classified elsewhere: Secondary | ICD-10-CM | POA: Diagnosis not present

## 2017-11-01 DIAGNOSIS — R7881 Bacteremia: Secondary | ICD-10-CM | POA: Diagnosis not present

## 2017-11-01 DIAGNOSIS — Z5181 Encounter for therapeutic drug level monitoring: Secondary | ICD-10-CM | POA: Diagnosis not present

## 2017-11-01 DIAGNOSIS — E1151 Type 2 diabetes mellitus with diabetic peripheral angiopathy without gangrene: Secondary | ICD-10-CM | POA: Diagnosis not present

## 2017-11-01 DIAGNOSIS — I70262 Atherosclerosis of native arteries of extremities with gangrene, left leg: Secondary | ICD-10-CM | POA: Diagnosis not present

## 2017-11-01 DIAGNOSIS — Z4781 Encounter for orthopedic aftercare following surgical amputation: Secondary | ICD-10-CM | POA: Diagnosis not present

## 2017-11-01 DIAGNOSIS — I70261 Atherosclerosis of native arteries of extremities with gangrene, right leg: Secondary | ICD-10-CM | POA: Diagnosis not present

## 2017-11-01 DIAGNOSIS — Z452 Encounter for adjustment and management of vascular access device: Secondary | ICD-10-CM | POA: Diagnosis not present

## 2017-11-01 DIAGNOSIS — Z792 Long term (current) use of antibiotics: Secondary | ICD-10-CM | POA: Diagnosis not present

## 2017-11-02 DIAGNOSIS — E1151 Type 2 diabetes mellitus with diabetic peripheral angiopathy without gangrene: Secondary | ICD-10-CM | POA: Diagnosis not present

## 2017-11-02 DIAGNOSIS — Z79899 Other long term (current) drug therapy: Secondary | ICD-10-CM | POA: Diagnosis not present

## 2017-11-02 DIAGNOSIS — B9562 Methicillin resistant Staphylococcus aureus infection as the cause of diseases classified elsewhere: Secondary | ICD-10-CM | POA: Diagnosis not present

## 2017-11-02 DIAGNOSIS — I70262 Atherosclerosis of native arteries of extremities with gangrene, left leg: Secondary | ICD-10-CM | POA: Diagnosis not present

## 2017-11-02 DIAGNOSIS — Z4781 Encounter for orthopedic aftercare following surgical amputation: Secondary | ICD-10-CM | POA: Diagnosis not present

## 2017-11-02 DIAGNOSIS — Z452 Encounter for adjustment and management of vascular access device: Secondary | ICD-10-CM | POA: Diagnosis not present

## 2017-11-02 DIAGNOSIS — R7881 Bacteremia: Secondary | ICD-10-CM | POA: Diagnosis not present

## 2017-11-02 DIAGNOSIS — Z5181 Encounter for therapeutic drug level monitoring: Secondary | ICD-10-CM | POA: Diagnosis not present

## 2017-11-02 DIAGNOSIS — I70261 Atherosclerosis of native arteries of extremities with gangrene, right leg: Secondary | ICD-10-CM | POA: Diagnosis not present

## 2017-11-02 DIAGNOSIS — Z792 Long term (current) use of antibiotics: Secondary | ICD-10-CM | POA: Diagnosis not present

## 2017-11-06 DIAGNOSIS — R7881 Bacteremia: Secondary | ICD-10-CM | POA: Diagnosis not present

## 2017-11-06 DIAGNOSIS — B9562 Methicillin resistant Staphylococcus aureus infection as the cause of diseases classified elsewhere: Secondary | ICD-10-CM | POA: Diagnosis not present

## 2017-11-06 DIAGNOSIS — I70262 Atherosclerosis of native arteries of extremities with gangrene, left leg: Secondary | ICD-10-CM | POA: Diagnosis not present

## 2017-11-06 DIAGNOSIS — Z4781 Encounter for orthopedic aftercare following surgical amputation: Secondary | ICD-10-CM | POA: Diagnosis not present

## 2017-11-06 DIAGNOSIS — I70261 Atherosclerosis of native arteries of extremities with gangrene, right leg: Secondary | ICD-10-CM | POA: Diagnosis not present

## 2017-11-06 DIAGNOSIS — E1151 Type 2 diabetes mellitus with diabetic peripheral angiopathy without gangrene: Secondary | ICD-10-CM | POA: Diagnosis not present

## 2017-11-06 DIAGNOSIS — Z452 Encounter for adjustment and management of vascular access device: Secondary | ICD-10-CM | POA: Diagnosis not present

## 2017-11-06 DIAGNOSIS — Z792 Long term (current) use of antibiotics: Secondary | ICD-10-CM | POA: Diagnosis not present

## 2017-11-06 DIAGNOSIS — Z5181 Encounter for therapeutic drug level monitoring: Secondary | ICD-10-CM | POA: Diagnosis not present

## 2017-11-07 DIAGNOSIS — Z452 Encounter for adjustment and management of vascular access device: Secondary | ICD-10-CM | POA: Diagnosis not present

## 2017-11-07 DIAGNOSIS — I70261 Atherosclerosis of native arteries of extremities with gangrene, right leg: Secondary | ICD-10-CM | POA: Diagnosis not present

## 2017-11-07 DIAGNOSIS — E1151 Type 2 diabetes mellitus with diabetic peripheral angiopathy without gangrene: Secondary | ICD-10-CM | POA: Diagnosis not present

## 2017-11-07 DIAGNOSIS — Z5181 Encounter for therapeutic drug level monitoring: Secondary | ICD-10-CM | POA: Diagnosis not present

## 2017-11-07 DIAGNOSIS — Z792 Long term (current) use of antibiotics: Secondary | ICD-10-CM | POA: Diagnosis not present

## 2017-11-07 DIAGNOSIS — Z4781 Encounter for orthopedic aftercare following surgical amputation: Secondary | ICD-10-CM | POA: Diagnosis not present

## 2017-11-07 DIAGNOSIS — R7881 Bacteremia: Secondary | ICD-10-CM | POA: Diagnosis not present

## 2017-11-07 DIAGNOSIS — I70262 Atherosclerosis of native arteries of extremities with gangrene, left leg: Secondary | ICD-10-CM | POA: Diagnosis not present

## 2017-11-07 DIAGNOSIS — B9562 Methicillin resistant Staphylococcus aureus infection as the cause of diseases classified elsewhere: Secondary | ICD-10-CM | POA: Diagnosis not present

## 2017-11-09 DIAGNOSIS — I70262 Atherosclerosis of native arteries of extremities with gangrene, left leg: Secondary | ICD-10-CM | POA: Diagnosis not present

## 2017-11-09 DIAGNOSIS — B9562 Methicillin resistant Staphylococcus aureus infection as the cause of diseases classified elsewhere: Secondary | ICD-10-CM | POA: Diagnosis not present

## 2017-11-09 DIAGNOSIS — R7881 Bacteremia: Secondary | ICD-10-CM | POA: Diagnosis not present

## 2017-11-09 DIAGNOSIS — Z452 Encounter for adjustment and management of vascular access device: Secondary | ICD-10-CM | POA: Diagnosis not present

## 2017-11-09 DIAGNOSIS — Z4781 Encounter for orthopedic aftercare following surgical amputation: Secondary | ICD-10-CM | POA: Diagnosis not present

## 2017-11-09 DIAGNOSIS — I70261 Atherosclerosis of native arteries of extremities with gangrene, right leg: Secondary | ICD-10-CM | POA: Diagnosis not present

## 2017-11-09 DIAGNOSIS — Z792 Long term (current) use of antibiotics: Secondary | ICD-10-CM | POA: Diagnosis not present

## 2017-11-09 DIAGNOSIS — E1151 Type 2 diabetes mellitus with diabetic peripheral angiopathy without gangrene: Secondary | ICD-10-CM | POA: Diagnosis not present

## 2017-11-09 DIAGNOSIS — Z5181 Encounter for therapeutic drug level monitoring: Secondary | ICD-10-CM | POA: Diagnosis not present

## 2017-11-13 ENCOUNTER — Ambulatory Visit (INDEPENDENT_AMBULATORY_CARE_PROVIDER_SITE_OTHER): Payer: Self-pay | Admitting: Surgery

## 2017-11-13 ENCOUNTER — Other Ambulatory Visit: Payer: Self-pay

## 2017-11-13 ENCOUNTER — Encounter: Payer: Self-pay | Admitting: Surgery

## 2017-11-13 VITALS — BP 122/69 | HR 68 | Temp 97.4°F | Resp 18 | Ht 67.0 in | Wt 213.0 lb

## 2017-11-13 DIAGNOSIS — Z5181 Encounter for therapeutic drug level monitoring: Secondary | ICD-10-CM | POA: Diagnosis not present

## 2017-11-13 DIAGNOSIS — Z4781 Encounter for orthopedic aftercare following surgical amputation: Secondary | ICD-10-CM | POA: Diagnosis not present

## 2017-11-13 DIAGNOSIS — Z792 Long term (current) use of antibiotics: Secondary | ICD-10-CM | POA: Diagnosis not present

## 2017-11-13 DIAGNOSIS — B9562 Methicillin resistant Staphylococcus aureus infection as the cause of diseases classified elsewhere: Secondary | ICD-10-CM | POA: Diagnosis not present

## 2017-11-13 DIAGNOSIS — I70261 Atherosclerosis of native arteries of extremities with gangrene, right leg: Secondary | ICD-10-CM | POA: Diagnosis not present

## 2017-11-13 DIAGNOSIS — E1151 Type 2 diabetes mellitus with diabetic peripheral angiopathy without gangrene: Secondary | ICD-10-CM | POA: Diagnosis not present

## 2017-11-13 DIAGNOSIS — I96 Gangrene, not elsewhere classified: Secondary | ICD-10-CM

## 2017-11-13 DIAGNOSIS — Z452 Encounter for adjustment and management of vascular access device: Secondary | ICD-10-CM | POA: Diagnosis not present

## 2017-11-13 DIAGNOSIS — I70262 Atherosclerosis of native arteries of extremities with gangrene, left leg: Secondary | ICD-10-CM | POA: Diagnosis not present

## 2017-11-13 DIAGNOSIS — R7881 Bacteremia: Secondary | ICD-10-CM | POA: Diagnosis not present

## 2017-11-13 MED ORDER — SULFAMETHOXAZOLE-TRIMETHOPRIM 400-80 MG PO TABS: 1.0000 | ORAL_TABLET | Freq: Two times a day (BID) | ORAL | 0 refills | Status: DC

## 2017-11-13 MED ORDER — COLLAGENASE 250 UNIT/GM EX OINT: 1.0000 "application " | TOPICAL_OINTMENT | Freq: Every day | CUTANEOUS | 1 refills | Status: DC

## 2017-11-13 NOTE — Progress Notes (Signed)
Patient name: Mike Wright MRN: [id number] DOB: 1933/07/21 Sex: male  REASON FOR VISIT:     post op  HISTORY OF PRESENT ILLNESS:   Mike Connaughton Butleris a 84 y.o.malewhoreturns today for follow-up. He initially presented with bilateral ischemic great toes. On 09/15/2017 he underwent abdominal aortogram. He was found to have a short segment occlusion of the peroneal artery there was crossed and treated with a 3 mm balloon. There was also multiple high-grade lesions greater than 90% throughout the posterior tibial artery across the ankle that were treated with 2 mm and 3 mm balloon. 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 He is here today for follow-up.  He was discharged home on vancomycin via a PICC line. His infusion stopped on July 5. At his last visit, I felt that he had developed some cellulitis so I gave him another 2 weeks of IV vancomycin.  He is here today for wound check.   CURRENT MEDICATIONS:    Current Outpatient Medications  Medication Sig Dispense Refill  . acetaminophen (TYLENOL) 500 MG tablet Take 500 mg by mouth as needed for mild pain or headache.    . albuterol (PROVENTIL) (2.5 MG/3ML) 0.083% nebulizer solution Inhale 3 mLs into the lungs every 4 (four) hours as needed for wheezing or shortness of breath. 75 mL 12  . amLODipine (NORVASC) 10 MG tablet Take 1 tablet (10 mg total) by mouth daily. 90 tablet 3  . apixaban (ELIQUIS) 5 MG TABS tablet Take 1 tablet (5 mg total) by mouth 2 (two) times daily. 180 tablet 3  . atorvastatin (LIPITOR) 80 MG tablet Take 1 tablet (80 mg total) by mouth every evening. 30 tablet 0  . Cholecalciferol (D3-1000 PO) Take 2,000 Units by mouth daily.    . clopidogrel (PLAVIX) 75 MG tablet Take 1 tablet (75 mg total) by  mouth daily. 30 tablet 0  . collagenase (SANTYL) ointment Apply 1 application topically daily. To the bilateral great toe amputation sites. Avoid exposure to skin surrounding the wounds. 30 g 2  . Cyanocobalamin (B-12) 2500 MCG TABS Take 2,500 mcg by mouth daily.     . finasteride (PROSCAR) 5 MG tablet Take 5 mg by mouth daily.     . furosemide (LASIX) 20 MG tablet TAKE 1 TABLET DAILY AS NEEDED 90 tablet 0  . insulin glargine (LANTUS) 100 UNIT/ML injection Inject 30-40 Units into the skin See admin instructions. Inject 40 units SQ in the morning and inject 30 units SQ at bedtime    . levalbuterol (XOPENEX HFA) 45 MCG/ACT inhaler Inhale 1 puff into the lungs every 6 (six) hours as needed for wheezing or shortness of breath. 1 Inhaler 0  . meclizine (ANTIVERT) 12.5 MG tablet Take 1 tablet (12.5 mg total) by mouth 3 (three) times daily as needed for dizziness. 30 tablet 0  . metoprolol succinate (TOPROL-XL) 100 MG 24 hr tablet TAKE 1/2 TABLET EVERY DAY 45 tablet 1  . nitroGLYCERIN (NITROSTAT) 0.4 MG SL tablet PLACE 1 TABLET (0.4 MG TOTAL) UNDER THE TONGUE EVERY 5  MINUTES AS NEEDED FOR CHEST PAIN. 25 tablet 3  . pantoprazole (PROTONIX) 40 MG tablet Take 1 tablet (40 mg total) by mouth daily. To protect stomach while taking multiple blood thinners. 30 tablet 0  . psyllium (METAMUCIL) 58.6 % packet Take 1 packet by  mouth daily.    [name] tiotropium (SPIRIVA) 18 MCG inhalation capsule Place 1 capsule (18 mcg total) into inhaler and inhale daily. 30 capsule 12  . traZODone (DESYREL) 100 MG tablet Take 100 mg by mouth at bedtime.     [name] lisinopril (PRINIVIL,ZESTRIL) 5 MG tablet Take 1 tablet (5 mg total) by mouth daily. 90 tablet 1   No current facility-administered medications for this visit.     REVIEW OF SYSTEMS:   [X]  denotes positive finding, [ ]  denotes negative finding Cardiac  Comments:  Chest pain or chest pressure:    Shortness of breath upon exertion:    Short of breath when lying flat:      Irregular heart rhythm:    Constitutional    Fever or chills:      PHYSICAL EXAM:   Vitals:   11/13/17 1432  BP: 122/69  Pulse: 68  Resp: 18  Temp: (!) 97.4 F (36.3 C)  TempSrc: Oral  SpO2: 98%  Weight: 213 lb (96.6 kg)  Height: 5\' 7"  (1.702 m)    GENERAL: The patient is a well-nourished male, in no acute distress. The vital signs are documented above. CARDIOVASCULAR: There is a regular rate and rhythm. PULMONARY: Non-labored respirations Hypertrophied skin around the edges of the wound were sharply debrided today.  I also debrided the base of the wound sharply until I got back to good bleeding.  There is a small amount of erythema on the dorsum of the right foot.  STUDIES:   None   MEDICAL ISSUES:   I am starting the patient on 2 weeks worth of Bactrim for the redness on his foot.  The wounds did clean up nicely today.  He will continue with Santyl.  He will follow-up in 3 weeks.  I am still considering him for candidate into the Hemostemix trial  [name], MD [facility] [phone] Pager [phone]

## 2017-11-14 ENCOUNTER — Telehealth: Payer: Self-pay | Admitting: Cardiology

## 2017-11-14 NOTE — Telephone Encounter (Signed)
Patient called stating that he continues to have a lot of shortness of breath. States that this has been going on for several days now.  Please call [phone].

## 2017-11-14 NOTE — Telephone Encounter (Signed)
Left message to return call 

## 2017-11-15 NOTE — Telephone Encounter (Signed)
Patient returned call

## 2017-11-15 NOTE — Telephone Encounter (Signed)
SOB - noticed for the last 4-5 days.  Does notice being worse with exertion.  Humidity bothering as well.  Does state that he has also had some chest pain.  Has had to take his Nitroglycerin 3-4 x but relieved after 1 tab.  No weight gain.  Hard to tell if he has any dizziness due to both of his big toes being amputated.  Really changed his balance.  Did have OV scheduled for 09/26/2017, but had to cancel due to being in the hospital.  Will fwd to provider for further advice.  In the meantime, if symptoms worsen - advised to go to ED.  He verbalized understanding.

## 2017-11-16 DIAGNOSIS — R7881 Bacteremia: Secondary | ICD-10-CM | POA: Diagnosis not present

## 2017-11-16 DIAGNOSIS — I70261 Atherosclerosis of native arteries of extremities with gangrene, right leg: Secondary | ICD-10-CM | POA: Diagnosis not present

## 2017-11-16 DIAGNOSIS — E1151 Type 2 diabetes mellitus with diabetic peripheral angiopathy without gangrene: Secondary | ICD-10-CM | POA: Diagnosis not present

## 2017-11-16 DIAGNOSIS — Z5181 Encounter for therapeutic drug level monitoring: Secondary | ICD-10-CM | POA: Diagnosis not present

## 2017-11-16 DIAGNOSIS — Z4781 Encounter for orthopedic aftercare following surgical amputation: Secondary | ICD-10-CM | POA: Diagnosis not present

## 2017-11-16 DIAGNOSIS — Z792 Long term (current) use of antibiotics: Secondary | ICD-10-CM | POA: Diagnosis not present

## 2017-11-16 DIAGNOSIS — Z452 Encounter for adjustment and management of vascular access device: Secondary | ICD-10-CM | POA: Diagnosis not present

## 2017-11-16 DIAGNOSIS — I70262 Atherosclerosis of native arteries of extremities with gangrene, left leg: Secondary | ICD-10-CM | POA: Diagnosis not present

## 2017-11-16 DIAGNOSIS — B9562 Methicillin resistant Staphylococcus aureus infection as the cause of diseases classified elsewhere: Secondary | ICD-10-CM | POA: Diagnosis not present

## 2017-11-16 NOTE — Telephone Encounter (Signed)
Can he see me 2pm Friday (tomorrow) in [place] Mignogna MD

## 2017-11-17 ENCOUNTER — Ambulatory Visit: Payer: Medicare HMO | Admitting: Cardiology

## 2017-11-17 ENCOUNTER — Inpatient Hospital Stay (HOSPITAL_COMMUNITY)
Admission: EM | Admit: 2017-11-17 | Discharge: 2017-11-22 | DRG: 812 | Disposition: A | Discharge status: Home Health | Payer: Medicare Other | Attending: Cardiovascular Disease | Admitting: Cardiovascular Disease

## 2017-11-17 ENCOUNTER — Encounter (HOSPITAL_COMMUNITY): Payer: Self-pay

## 2017-11-17 ENCOUNTER — Other Ambulatory Visit: Payer: Self-pay

## 2017-11-17 ENCOUNTER — Emergency Department (HOSPITAL_COMMUNITY): Payer: Medicare Other

## 2017-11-17 ENCOUNTER — Encounter: Payer: Self-pay | Admitting: Cardiology

## 2017-11-17 VITALS — BP 118/60 | HR 95 | Ht 67.0 in | Wt 216.0 lb

## 2017-11-17 DIAGNOSIS — J449 Chronic obstructive pulmonary disease, unspecified: Secondary | ICD-10-CM | POA: Diagnosis not present

## 2017-11-17 DIAGNOSIS — R05 Cough: Secondary | ICD-10-CM | POA: Diagnosis not present

## 2017-11-17 DIAGNOSIS — Z9861 Coronary angioplasty status: Secondary | ICD-10-CM

## 2017-11-17 DIAGNOSIS — R0789 Other chest pain: Secondary | ICD-10-CM | POA: Diagnosis not present

## 2017-11-17 DIAGNOSIS — I70209 Unspecified atherosclerosis of native arteries of extremities, unspecified extremity: Secondary | ICD-10-CM | POA: Diagnosis present

## 2017-11-17 DIAGNOSIS — R079 Chest pain, unspecified: Secondary | ICD-10-CM | POA: Diagnosis present

## 2017-11-17 DIAGNOSIS — Z981 Arthrodesis status: Secondary | ICD-10-CM | POA: Diagnosis not present

## 2017-11-17 DIAGNOSIS — Z9981 Dependence on supplemental oxygen: Secondary | ICD-10-CM

## 2017-11-17 DIAGNOSIS — R0902 Hypoxemia: Secondary | ICD-10-CM | POA: Diagnosis present

## 2017-11-17 DIAGNOSIS — I25119 Atherosclerotic heart disease of native coronary artery with unspecified angina pectoris: Secondary | ICD-10-CM | POA: Diagnosis not present

## 2017-11-17 DIAGNOSIS — I2510 Atherosclerotic heart disease of native coronary artery without angina pectoris: Secondary | ICD-10-CM | POA: Diagnosis not present

## 2017-11-17 DIAGNOSIS — G4733 Obstructive sleep apnea (adult) (pediatric): Secondary | ICD-10-CM | POA: Diagnosis not present

## 2017-11-17 DIAGNOSIS — Z96653 Presence of artificial knee joint, bilateral: Secondary | ICD-10-CM | POA: Diagnosis not present

## 2017-11-17 DIAGNOSIS — E1151 Type 2 diabetes mellitus with diabetic peripheral angiopathy without gangrene: Secondary | ICD-10-CM | POA: Diagnosis present

## 2017-11-17 DIAGNOSIS — Z955 Presence of coronary angioplasty implant and graft: Secondary | ICD-10-CM

## 2017-11-17 DIAGNOSIS — Z87891 Personal history of nicotine dependence: Secondary | ICD-10-CM | POA: Diagnosis not present

## 2017-11-17 DIAGNOSIS — E039 Hypothyroidism, unspecified: Secondary | ICD-10-CM | POA: Diagnosis present

## 2017-11-17 DIAGNOSIS — D649 Anemia, unspecified: Secondary | ICD-10-CM | POA: Diagnosis not present

## 2017-11-17 DIAGNOSIS — D500 Iron deficiency anemia secondary to blood loss (chronic): Secondary | ICD-10-CM | POA: Diagnosis not present

## 2017-11-17 DIAGNOSIS — Z8249 Family history of ischemic heart disease and other diseases of the circulatory system: Secondary | ICD-10-CM

## 2017-11-17 DIAGNOSIS — N400 Enlarged prostate without lower urinary tract symptoms: Secondary | ICD-10-CM | POA: Diagnosis not present

## 2017-11-17 DIAGNOSIS — I5032 Chronic diastolic (congestive) heart failure: Secondary | ICD-10-CM | POA: Diagnosis not present

## 2017-11-17 DIAGNOSIS — E782 Mixed hyperlipidemia: Secondary | ICD-10-CM | POA: Diagnosis present

## 2017-11-17 DIAGNOSIS — I10 Essential (primary) hypertension: Secondary | ICD-10-CM | POA: Diagnosis present

## 2017-11-17 DIAGNOSIS — Z79899 Other long term (current) drug therapy: Secondary | ICD-10-CM | POA: Diagnosis not present

## 2017-11-17 DIAGNOSIS — K219 Gastro-esophageal reflux disease without esophagitis: Secondary | ICD-10-CM | POA: Diagnosis not present

## 2017-11-17 DIAGNOSIS — I480 Paroxysmal atrial fibrillation: Secondary | ICD-10-CM | POA: Diagnosis not present

## 2017-11-17 DIAGNOSIS — D509 Iron deficiency anemia, unspecified: Secondary | ICD-10-CM | POA: Diagnosis present

## 2017-11-17 DIAGNOSIS — R1084 Generalized abdominal pain: Secondary | ICD-10-CM | POA: Diagnosis not present

## 2017-11-17 DIAGNOSIS — R109 Unspecified abdominal pain: Secondary | ICD-10-CM

## 2017-11-17 DIAGNOSIS — I110 Hypertensive heart disease with heart failure: Secondary | ICD-10-CM | POA: Diagnosis present

## 2017-11-17 DIAGNOSIS — Z7902 Long term (current) use of antithrombotics/antiplatelets: Secondary | ICD-10-CM | POA: Diagnosis not present

## 2017-11-17 DIAGNOSIS — R6881 Early satiety: Secondary | ICD-10-CM | POA: Diagnosis not present

## 2017-11-17 DIAGNOSIS — R110 Nausea: Secondary | ICD-10-CM | POA: Diagnosis present

## 2017-11-17 DIAGNOSIS — R634 Abnormal weight loss: Secondary | ICD-10-CM | POA: Diagnosis not present

## 2017-11-17 DIAGNOSIS — R0602 Shortness of breath: Secondary | ICD-10-CM | POA: Diagnosis not present

## 2017-11-17 DIAGNOSIS — Z794 Long term (current) use of insulin: Secondary | ICD-10-CM | POA: Diagnosis not present

## 2017-11-17 DIAGNOSIS — Z7901 Long term (current) use of anticoagulants: Secondary | ICD-10-CM

## 2017-11-17 DIAGNOSIS — E8770 Fluid overload, unspecified: Secondary | ICD-10-CM | POA: Diagnosis present

## 2017-11-17 DIAGNOSIS — R1030 Lower abdominal pain, unspecified: Secondary | ICD-10-CM | POA: Diagnosis present

## 2017-11-17 DIAGNOSIS — I5033 Acute on chronic diastolic (congestive) heart failure: Secondary | ICD-10-CM | POA: Diagnosis present

## 2017-11-17 LAB — POC OCCULT BLOOD, ED: FECAL OCCULT BLD: NEGATIVE

## 2017-11-17 LAB — CBC WITH DIFFERENTIAL/PLATELET
Basophils Absolute: 0 10*3/uL (ref 0.0–0.1)
Basophils Relative: 0 %
EOS PCT: 1 %
Eosinophils Absolute: 0.1 10*3/uL (ref 0.0–0.7)
HEMATOCRIT: 26.6 % — AB (ref 39.0–52.0)
Hemoglobin: 8.1 g/dL — ABNORMAL LOW (ref 13.0–17.0)
LYMPHS ABS: 1.1 10*3/uL (ref 0.7–4.0)
LYMPHS PCT: 14 %
MCH: 23.8 pg — AB (ref 26.0–34.0)
MCHC: 30.5 g/dL (ref 30.0–36.0)
MCV: 78 fL (ref 78.0–100.0)
MONO ABS: 1.9 10*3/uL — AB (ref 0.1–1.0)
MONOS PCT: 24 %
NEUTROS ABS: 4.8 10*3/uL (ref 1.7–7.7)
Neutrophils Relative %: 61 %
Platelets: 226 10*3/uL (ref 150–400)
RBC: 3.41 MIL/uL — ABNORMAL LOW (ref 4.22–5.81)
RDW: 17.4 % — AB (ref 11.5–15.5)
WBC: 7.9 10*3/uL (ref 4.0–10.5)

## 2017-11-17 LAB — I-STAT CHEM 8, ED
BUN: 18 mg/dL (ref 8–23)
CALCIUM ION: 1.17 mmol/L (ref 1.15–1.40)
CREATININE: 1.1 mg/dL (ref 0.61–1.24)
Chloride: 104 mmol/L (ref 98–111)
GLUCOSE: 106 mg/dL — AB (ref 70–99)
HCT: 25 % — ABNORMAL LOW (ref 39.0–52.0)
HEMOGLOBIN: 8.5 g/dL — AB (ref 13.0–17.0)
POTASSIUM: 4.5 mmol/L (ref 3.5–5.1)
Sodium: 139 mmol/L (ref 135–145)
TCO2: 23 mmol/L (ref 22–32)

## 2017-11-17 LAB — TROPONIN I: Troponin I: <0.03 ng/mL (ref <0.03)

## 2017-11-17 LAB — BRAIN NATRIURETIC PEPTIDE: B Natriuretic Peptide: 192 pg/mL — ABNORMAL HIGH (ref 0.0–100.0)

## 2017-11-17 LAB — GLUCOSE, CAPILLARY: Glucose-Capillary: 130 mg/dL — ABNORMAL HIGH (ref 70–99)

## 2017-11-17 LAB — I-STAT TROPONIN, ED: Troponin i, poc: 0 ng/mL (ref 0.00–0.08)

## 2017-11-17 LAB — HEMOGLOBIN A1C
HEMOGLOBIN A1C: 6.5 % — AB (ref 4.8–5.6)
MEAN PLASMA GLUCOSE: 139.85 mg/dL

## 2017-11-17 LAB — D-DIMER, QUANTITATIVE: D-Dimer, Quant: 0.48 ug/mL-FEU (ref 0.00–0.50)

## 2017-11-17 MED ORDER — TIOTROPIUM BROMIDE MONOHYDRATE 18 MCG IN CAPS
18.0000 ug | ORAL_CAPSULE | Freq: Every day | RESPIRATORY_TRACT | Status: DC
  Administered 2017-11-18 – 2017-11-22 (×5): 18 ug via RESPIRATORY_TRACT
  Filled 2017-11-17: qty 5

## 2017-11-17 MED ORDER — IPRATROPIUM-ALBUTEROL 0.5-2.5 (3) MG/3ML IN SOLN: 3.0000 mL | RESPIRATORY_TRACT | Status: DC | PRN

## 2017-11-17 MED ORDER — PANTOPRAZOLE SODIUM 40 MG PO TBEC
40.0000 mg | DELAYED_RELEASE_TABLET | Freq: Every day | ORAL | Status: DC
  Administered 2017-11-18 – 2017-11-22 (×5): 40 mg via ORAL
  Filled 2017-11-17 (×5): qty 1

## 2017-11-17 MED ORDER — ASPIRIN 81 MG PO CHEW
324.0000 mg | CHEWABLE_TABLET | Freq: Once | ORAL | Status: AC
  Administered 2017-11-17: 324 mg via ORAL
  Filled 2017-11-17: qty 4

## 2017-11-17 MED ORDER — ONDANSETRON HCL 4 MG/2ML IJ SOLN: 4.0000 mg | Freq: Four times a day (QID) | INTRAMUSCULAR | Status: DC | PRN

## 2017-11-17 MED ORDER — FINASTERIDE 5 MG PO TABS
5.0000 mg | ORAL_TABLET | Freq: Every day | ORAL | Status: DC
  Administered 2017-11-18 – 2017-11-22 (×5): 5 mg via ORAL
  Filled 2017-11-17 (×5): qty 1

## 2017-11-17 MED ORDER — NITROGLYCERIN 0.4 MG SL SUBL
0.4000 mg | SUBLINGUAL_TABLET | Freq: Once | SUBLINGUAL | Status: AC
  Administered 2017-11-17: 0.4 mg via SUBLINGUAL

## 2017-11-17 MED ORDER — TRAZODONE HCL 100 MG PO TABS
100.0000 mg | ORAL_TABLET | Freq: Every day | ORAL | Status: DC
  Administered 2017-11-17 – 2017-11-21 (×5): 100 mg via ORAL
  Filled 2017-11-17 (×5): qty 1

## 2017-11-17 MED ORDER — NITROGLYCERIN IN D5W 200-5 MCG/ML-% IV SOLN
0.0000 ug/min | INTRAVENOUS | Status: DC
  Filled 2017-11-17: qty 250

## 2017-11-17 MED ORDER — ATORVASTATIN CALCIUM 80 MG PO TABS
80.0000 mg | ORAL_TABLET | Freq: Every evening | ORAL | Status: DC
  Administered 2017-11-18 – 2017-11-21 (×4): 80 mg via ORAL
  Filled 2017-11-17 (×4): qty 1

## 2017-11-17 MED ORDER — SULFAMETHOXAZOLE-TRIMETHOPRIM 400-80 MG PO TABS
1.0000 | ORAL_TABLET | Freq: Two times a day (BID) | ORAL | Status: DC
  Administered 2017-11-18 – 2017-11-22 (×10): 1 via ORAL
  Filled 2017-11-17 (×11): qty 1

## 2017-11-17 MED ORDER — INSULIN GLARGINE 100 UNIT/ML ~~LOC~~ SOLN
20.0000 [IU] | Freq: Two times a day (BID) | SUBCUTANEOUS | Status: DC
  Administered 2017-11-18 – 2017-11-22 (×9): 20 [IU] via SUBCUTANEOUS
  Filled 2017-11-17 (×11): qty 0.2

## 2017-11-17 MED ORDER — ALBUTEROL SULFATE (2.5 MG/3ML) 0.083% IN NEBU: 3.0000 mL | INHALATION_SOLUTION | Freq: Four times a day (QID) | RESPIRATORY_TRACT | Status: DC | PRN

## 2017-11-17 MED ORDER — NITROGLYCERIN 0.4 MG SL SUBL
SUBLINGUAL_TABLET | SUBLINGUAL | Status: AC
  Administered 2017-11-17: 0.4 mg via SUBLINGUAL
  Filled 2017-11-17: qty 1

## 2017-11-17 MED ORDER — ASPIRIN EC 81 MG PO TBEC
81.0000 mg | DELAYED_RELEASE_TABLET | Freq: Every day | ORAL | Status: DC
  Administered 2017-11-18: 81 mg via ORAL
  Filled 2017-11-17: qty 1

## 2017-11-17 MED ORDER — CLOPIDOGREL BISULFATE 75 MG PO TABS
75.0000 mg | ORAL_TABLET | Freq: Every day | ORAL | Status: DC
  Administered 2017-11-18 – 2017-11-22 (×5): 75 mg via ORAL
  Filled 2017-11-17 (×5): qty 1

## 2017-11-17 MED ORDER — PSYLLIUM 95 % PO PACK
1.0000 | PACK | Freq: Every day | ORAL | Status: DC
  Administered 2017-11-18 – 2017-11-22 (×5): 1 via ORAL
  Filled 2017-11-17 (×5): qty 1

## 2017-11-17 MED ORDER — PSYLLIUM 58.6 % PO PACK: 1.0000 | PACK | Freq: Every day | ORAL | Status: DC

## 2017-11-17 MED ORDER — NITROGLYCERIN 0.4 MG SL SUBL: 0.4000 mg | SUBLINGUAL_TABLET | SUBLINGUAL | Status: DC | PRN

## 2017-11-17 MED ORDER — METOPROLOL SUCCINATE ER 100 MG PO TB24
100.0000 mg | ORAL_TABLET | Freq: Every day | ORAL | Status: DC
  Administered 2017-11-18 – 2017-11-22 (×5): 100 mg via ORAL
  Filled 2017-11-17 (×5): qty 1

## 2017-11-17 MED ORDER — INSULIN ASPART 100 UNIT/ML ~~LOC~~ SOLN
0.0000 [IU] | Freq: Three times a day (TID) | SUBCUTANEOUS | Status: DC
  Administered 2017-11-18: 2 [IU] via SUBCUTANEOUS
  Administered 2017-11-19: 3 [IU] via SUBCUTANEOUS
  Administered 2017-11-19 – 2017-11-21 (×3): 2 [IU] via SUBCUTANEOUS
  Administered 2017-11-21: 3 [IU] via SUBCUTANEOUS
  Administered 2017-11-21: 8 [IU] via SUBCUTANEOUS
  Administered 2017-11-22: 2 [IU] via SUBCUTANEOUS
  Administered 2017-11-22: 5 [IU] via SUBCUTANEOUS

## 2017-11-17 MED ORDER — ACETAMINOPHEN 325 MG PO TABS: 650.0000 mg | ORAL_TABLET | ORAL | Status: DC | PRN

## 2017-11-17 MED ORDER — COLLAGENASE 250 UNIT/GM EX OINT
1.0000 "application " | TOPICAL_OINTMENT | Freq: Every day | CUTANEOUS | Status: DC
  Administered 2017-11-18 – 2017-11-22 (×4): 1 via TOPICAL
  Filled 2017-11-17: qty 30

## 2017-11-17 MED ORDER — AMLODIPINE BESYLATE 10 MG PO TABS
10.0000 mg | ORAL_TABLET | Freq: Every day | ORAL | Status: DC
  Administered 2017-11-18 – 2017-11-22 (×5): 10 mg via ORAL
  Filled 2017-11-17 (×5): qty 1

## 2017-11-17 NOTE — ED Notes (Signed)
ED Provider at bedside. 

## 2017-11-17 NOTE — Telephone Encounter (Signed)
Patient notified and verbalized understanding.  Is able to make OV today.

## 2017-11-17 NOTE — ED Notes (Signed)
Patient reports that he fell within the past two weeks and had an injury to his right chest with the fall.  Patient states that he has been coughing and that it hurts to cough.  Patient reports that he has CHF, atrial fib, and COPD. That he is on Eliquis for the A-fib.

## 2017-11-17 NOTE — Progress Notes (Signed)
Clinical Summary Mike Wright is a 84 y.o.male male seen today for follow up of the following medical problems.This is a focused visit no recent SOB/DOE and chest pain. For more detailed history please refer to prior clinic notes.   1. CAD - nonobstructive CAD by cath Jan 2012, LVEF 60-65% by LV gram.  - 06/2015 nuclear stress without clear ischemia - 05/2015 echo LVEF 65-70%, no WMAs, cannot evaluate diastolic function 0/1749 lexiscan without ischemia, low risk study.    - last visit due to progressing chest and SOB/DOE he was referred for [facility] - cath 04/2017 as reported below. Received DES to 75% D1, DES to 75% mid to distal LAD, DES to 70% prox LAD RHC with CI 2.67, mean PA 25, no wedge reported by LVEDP 14.  - discharged on triple therapy with ASA, plavix, eliquis with plan for 30 days, then stop ASA.   - recent increase in SOB/DOE x 2-3 weeks. Can occur at rest, but more so activity. DOE just walking from parking. Room to room DOE - +coughing, +wheezing. Clear phlegm. Coughing x 2 weeks, wheezing chronic but worst from baseline - no LE edema. Occasioanl orthopnea - left sided sharp pain/pressing like pain. Tends happen at rest. 8/10 in severity. +SOB. Worst with deep breathing. Lasts about 15 minutes, seems better with NG.Similar to prior symptoms. Increase frequency starting. Occurs 2-3 times per day.  - homoe weights 213 lbs stable   2. COPD - followed by pulmonary - recent increase in SOB/DOE, wheezing, cough  8. PAD - followed by vascular - admission 08/2017 with ischemia great toes. Had intervention on lower extremity vessels at that time.  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 Anemia    a. mild, noted 04/2017.  [name] CAD in native artery    a. [name] 04/2017 s/p DES to D1, DES to prox-mid LAD, DES to prox LAD  overlapping the prior stent, LVEF 55-65%.   . Chronic diastolic CHF (congestive heart failure) ([place])   . Diabetic ulcer of toe ([place])   . DJD (degenerative joint disease) of cervical spine   . Essential hypertension, benign   . GERD (gastroesophageal reflux disease)   . Heart murmur   . History of hiatal hernia   . HIT (heparin-induced thrombocytopenia) ([place])   . Hypothyroidism   . Hypoxia    a. went home on home O2 04/2017.  [name] Insomnia   . Mixed hyperlipidemia   . PAD (peripheral artery disease) ([place])   . PAF (paroxysmal atrial fibrillation) ([place])   . PSVT (paroxysmal supraventricular tachycardia) ([place])   . PVD (peripheral vascular disease) ([place])   . Renal insufficiency   . 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  . Sinus bradycardia    a. HR 30s-40s in 04/2017 -> diltiazem stopped, metoprolol reduced.  . Sleep apnea    "chose not to order CPAP at this time" (05/18/2017)  . Type 2 diabetes mellitus ([place])   . Wheezing    a. suspected COPD 04/2017. Former tobacco x 40 years.     Allergies  Allergen Reactions  . Codeine Shortness Of Breath  . Heparin Other (See Comments)    +HIT,  Severe bleeding   . Losartan Swelling    Per ENT UNSPECIFIED REACTION   . Other Other (See Comments)    Severe  bleeding UNSPECIFIED AGENT   . Oxycodone Other (See Comments)    "Made me act out of my mind" Other reaction(s): Other (See Comments) Mental status changes hallucinations     Current Outpatient Medications  Medication Sig Dispense Refill  . acetaminophen (TYLENOL) 500 MG tablet Take 500 mg by mouth as needed for mild pain or headache.    . albuterol (PROVENTIL) (2.5 MG/3ML) 0.083% nebulizer solution Inhale 3 mLs into the lungs every 4 (four) hours as needed for wheezing or shortness of breath. 75 mL 12  . amLODipine (NORVASC) 10 MG tablet Take 1 tablet (10 mg total) by mouth daily. 90 tablet 3  . apixaban (ELIQUIS) 5 MG TABS tablet Take 1 tablet (5 mg total) by mouth 2 (two) times  daily. 180 tablet 3  . atorvastatin (LIPITOR) 80 MG tablet Take 1 tablet (80 mg total) by mouth every evening. 30 tablet 0  . Cholecalciferol (D3-1000 PO) Take 2,000 Units by mouth daily.    . clopidogrel (PLAVIX) 75 MG tablet Take 1 tablet (75 mg total) by mouth daily. 30 tablet 0  . collagenase (SANTYL) ointment Apply 1 application topically daily. 30 g 1  . Cyanocobalamin (B-12) 2500 MCG TABS Take 2,500 mcg by mouth daily.     . finasteride (PROSCAR) 5 MG tablet Take 5 mg by mouth daily.     . furosemide (LASIX) 20 MG tablet TAKE 1 TABLET DAILY AS NEEDED 90 tablet 0  . insulin glargine (LANTUS) 100 UNIT/ML injection Inject 30-40 Units into the skin See admin instructions. Inject 40 units SQ in the morning and inject 30 units SQ at bedtime    . levalbuterol (XOPENEX HFA) 45 MCG/ACT inhaler Inhale 1 puff into the lungs every 6 (six) hours as needed for wheezing or shortness of breath. 1 Inhaler 0  . lisinopril (PRINIVIL,ZESTRIL) 5 MG tablet Take 1 tablet (5 mg total) by mouth daily. 90 tablet 1  . meclizine (ANTIVERT) 12.5 MG tablet Take 1 tablet (12.5 mg total) by mouth 3 (three) times daily as needed for dizziness. 30 tablet 0  . metoprolol succinate (TOPROL-XL) 100 MG 24 hr tablet TAKE 1/2 TABLET EVERY DAY 45 tablet 1  . nitroGLYCERIN (NITROSTAT) 0.4 MG SL tablet PLACE 1 TABLET (0.4 MG TOTAL) UNDER THE TONGUE EVERY 5  MINUTES AS NEEDED FOR CHEST PAIN. 25 tablet 3  . pantoprazole (PROTONIX) 40 MG tablet Take 1 tablet (40 mg total) by mouth daily. To protect stomach while taking multiple blood thinners. 30 tablet 0  . psyllium (METAMUCIL) 58.6 % packet Take 1 packet by mouth daily.    [name] sulfamethoxazole-trimethoprim (BACTRIM,SEPTRA) 400-80 MG tablet Take 1 tablet by mouth 2 (two) times daily. 28 tablet 0  . tiotropium (SPIRIVA) 18 MCG inhalation capsule Place 1 capsule (18 mcg total) into inhaler and inhale daily. 30 capsule 12  . traZODone (DESYREL) 100 MG tablet Take 100 mg by mouth at bedtime.       No current facility-administered medications for this visit.      Past Surgical History:  Procedure Laterality Date  . ABDOMINAL AORTOGRAM W/LOWER EXTREMITY N/A 09/15/2017   Procedure: ABDOMINAL AORTOGRAM W/LOWER EXTREMITY;  Surgeon: Serafina Mitchell, MD;  Location: [place] [facility];  Service: Cardiovascular;  Laterality: N/A;  . AMPUTATION Bilateral 09/20/2017   Procedure: BILATERAL GREAT TOE AMPUTATIONS INCLUDING METATARSAL HEADS;  Surgeon: Serafina Mitchell, MD;  Location: [facility] OR;  Service: Vascular;  Laterality: Bilateral;  . ANGIOPLASTY Right 09/20/2017   Procedure: ANGIOPLASTY RIGHT TIBIAL ARTERY;  Surgeon: Serafina Mitchell, MD;  Location: [facility] OR;  Service: Vascular;  Laterality: Right;  . APPENDECTOMY    . BACK SURGERY    . CARDIAC CATHETERIZATION  1980s; 2012;  . CATARACT EXTRACTION Left 2004  . COLONOSCOPY  2004   FLEISHMAN TICS  . CORONARY ANGIOPLASTY WITH STENT PLACEMENT  05/18/2017   "3 stents"  . CORONARY STENT INTERVENTION N/A 05/18/2017   Procedure: CORONARY STENT INTERVENTION;  Surgeon: Jettie Booze, MD;  Location: [place] [facility];  Service: Cardiovascular;  Laterality: N/A;  . JOINT REPLACEMENT    . LEFT HEART CATH AND CORONARY ANGIOGRAPHY N/A 05/18/2017   Procedure: LEFT HEART CATH AND CORONARY ANGIOGRAPHY;  Surgeon: Jettie Booze, MD;  Location: [place] [facility];  Service: Cardiovascular;  Laterality: N/A;  . LOWER EXTREMITY ANGIOGRAM Right 09/20/2017   Procedure: RIGHT LOWER LEG  ANGIOGRAM;  Surgeon: Serafina Mitchell, MD;  Location: [place];  Service: Vascular;  Laterality: Right;  . LUMBAR FUSION  2002   L3, 4 L4, 5 L5 S1 Fused by Dr. Glenna Fellows  . PERIPHERAL VASCULAR BALLOON ANGIOPLASTY Left 09/15/2017   Procedure: PERIPHERAL VASCULAR BALLOON ANGIOPLASTY;  Surgeon: Serafina Mitchell, MD;  Location: [place] [facility];  Service: Cardiovascular;  Laterality: Left;  PTA of Peroneal & Posterior Tibial  . POSTERIOR LUMBAR FUSION    . RHINOPLASTY    .  RIGHT HEART CATH N/A 05/18/2017   Procedure: RIGHT HEART CATH;  Surgeon: Jettie Booze, MD;  Location: [place] [facility];  Service: Cardiovascular;  Laterality: N/A;  . TEE WITHOUT CARDIOVERSION N/A 09/18/2017   Procedure: TRANSESOPHAGEAL ECHOCARDIOGRAM (TEE);  Surgeon: Fay Records, MD;  Location: [place];  Service: Cardiovascular;  Laterality: N/A;  . TOTAL KNEE ARTHROPLASTY Bilateral   . TRANSURETHRAL RESECTION OF PROSTATE  2001   [place]     Allergies  Allergen Reactions  . Codeine Shortness Of Breath  . Heparin Other (See Comments)    +HIT,  Severe bleeding   . Losartan Swelling    Per ENT UNSPECIFIED REACTION   . Other Other (See Comments)    Severe bleeding UNSPECIFIED AGENT   . Oxycodone Other (See Comments)    "Made me act out of my mind" Other reaction(s): Other (See Comments) Mental status changes hallucinations      Family History  Problem Relation Age of Onset  . Heart attack Father 17  . COPD Father   . COPD Mother   . Heart disease Mother   . Diabetes Mother   . Hypertension Sister   . CVA Sister   . Diabetes Sister   . Multiple sclerosis Sister      Social History Mr. Leatham reports that he quit smoking about 34 years ago. His smoking use included cigarettes. He started smoking about 64 years ago. He has a 60.00 pack-year smoking history. He has never used smokeless tobacco. Mr. Elamin reports that he drinks alcohol.   Review of Systems CONSTITUTIONAL: No weight loss, fever, chills, weakness or fatigue.  HEENT: Eyes: No visual loss, blurred vision, double vision or yellow sclerae.No hearing loss, sneezing, congestion, runny nose or sore throat.  SKIN: No rash or itching.  CARDIOVASCULAR: per hpi RESPIRATORY: per hpi  GASTROINTESTINAL: No anorexia, nausea, vomiting or diarrhea. No abdominal pain or blood.  GENITOURINARY: No burning on urination, no polyuria NEUROLOGICAL: No headache, dizziness, syncope, paralysis, ataxia, numbness or  tingling in the extremities. No change in bowel or bladder control.  MUSCULOSKELETAL: No muscle,  back pain, joint pain or stiffness.  LYMPHATICS: No enlarged nodes. No history of splenectomy.  PSYCHIATRIC: No history of depression or anxiety.  ENDOCRINOLOGIC: No reports of sweating, cold or heat intolerance. No polyuria or polydipsia.  [name]   Physical Examination Vitals:   11/17/17 1359  BP: 118/60  Pulse: 95  SpO2: 95%   Vitals:   11/17/17 1359  Weight: 216 lb (98 kg)  Height: 5\' 7"  (1.702 m)    Gen: resting comfortably, no acute distress HEENT: no scleral icterus, pupils equal round and reactive, no palptable cervical adenopathy,  CV: RRR, no m/r/g, no jvd Resp: Clear to auscultation bilaterally GI: abdomen is soft, non-tender, non-distended, normal bowel sounds, no hepatosplenomegaly MSK: extremities are warm, no edema.  Skin: warm, no rash Neuro:  no focal deficits Psych: appropriate affect   Diagnostic Studies  05/2015 echo Study Conclusions  - Left ventricle: The cavity size was normal. Wall thickness was increased increased in a pattern of mild to moderate LVH. Systolic function was vigorous. The estimated ejection fraction was in the range of 65% to 70%. Wall motion was normal; there were no regional wall motion abnormalities. The study is not technically sufficient to allow evaluation of LV diastolic function. - Aortic valve: Moderately calcified annulus. Mildly thickened leaflets. There was mild stenosis. There was mild regurgitation. Mean gradient (S): 13 mm Hg. Valve area (VTI): 1.51 cm^2. - Mitral valve: Mildly calcified annulus. Normal thickness leaflets . - Left atrium: The atrium was moderately dilated. - Technically adequate study.   06/2015 Nuclear stress test  No diagnostic ST segment changes to indicate ischemia.  Small, mild intensity, perfusion defects noted in the apical anterior and basal inferolateral walls. This is  most consistent with soft tissue attenuation given normal wall motion in these regions. No large ischemic zones noted.  This is a low risk study.  Nuclear stress EF: 64%.  05/2016 Event monitor  Rhythm is atrial fibrillation throughout study. Occasioanal PVCs  Min HR 51, Max HR 107, Avg HR 67  Reported symptoms correspond with rate controlled atrial fibrillation.  09/2016 nuclear stress  There was no ST segment deviation noted during stress.  The study is normal. There are no perfusion defects consistent with prior infarct or current ischemia.  This is a low risk study.  The left ventricular ejection fraction is normal (55-65%).   04/2017 cath  Dist RCA lesion is 40% stenosed.  Prox RCA lesion is 25% stenosed.  1st Diag lesion is 75% stenosed.  A drug-eluting stent was successfully placed using a STENT SYNERGY DES 2.25X12.  Post intervention, there is a 0% residual stenosis.  Prox LAD to Mid LAD lesion is 75% stenosed.  A drug-eluting stent was successfully placed using a STENT SYNERGY DES 3.5X38.  Post intervention, there is a 0% residual stenosis.  Prox LAD lesion is 70% stenosed.  A drug-eluting stent was successfully placed using a STENT SYNERGY DES 4X12, overlapping the prior stent.  Post intervention, there is a 0% residual stenosis.  The left ventricular systolic function is normal.  The left ventricular ejection fraction is 55-65% by visual estimate.  LV end diastolic pressure is normal.  There is no aortic valve stenosis.  Ao sat 98%, PA sat 65%; PA mean 25 mm Hg; unable to wedge the catheter.  Normal right heat pressures.  Tortuous right subclavian making catheter navigation difficult from the right radial approach.  Continue dual antiplatelet therapy along with Eliquis for 30 days.  Restart Eliquis tomorrow.  After  30 days, stop aspirin. Continue Plavix and Eliquis.   After 6 months, can consider stopping clopidogrel if he has  bleeding issues. Given the nomber of stents, would try to complete one year of clopidogrel if no bleeding issues.    Assessment and Plan  1. Chest pain/SOB.  - significant SOB/DOE, chest pain, cough, wheezing over the last few weeks. DOE just walking room to room at home. - from clinic evaluation unclear primary etiology of his symptoms. He has history of CAD, COPD, and diastolic HF. His exam does not provide clear evidence of etiology of symptoms.  - given progression of symptoms we have recommended he be evaluated in the ER with CXR, labs including troponins, cbc, bnp. EKG in clinic is nonacute.  - With his progressing chest pain would recommend admission overnight with cycling of cardiac enzymes      Arnoldo Lenis, M.D.

## 2017-11-17 NOTE — Patient Instructions (Addendum)
PT transferred over to ED for evaluation

## 2017-11-17 NOTE — ED Triage Notes (Signed)
Pt sent over from [facility] due to SOB and Afib. Pt reports that he has been SOB for 3 weeks and was at [place] for office visit.

## 2017-11-17 NOTE — ED Notes (Signed)
Patient's chest pain has resolved

## 2017-11-17 NOTE — H&P (Signed)
Cardiology Admission History and Physical:   Patient ID: Mike Wright; MRN: [id number]; DOB: 1933-06-20   Admission date: 11/17/2017  Primary Care Provider: Medicine, [facility] Primary Cardiologist: Branch   Chief Complaint:  Dyspnea and chest pain  Patient Profile:   Mike Wright is a 84 y.o. male with a history of CAD with prior PCI, PAD, AF, HFpEF, HTN, DM, HLD, who is admitted with chest pain.   History of Present Illness:   Mike Wright is a 84 y.o. male with a history of CAD with prior PCI, PAD, AF, HFpEF, HTN, DM, HLD, who is admitted with chest pain.   The patient has a history of multiple cardiac comorbidities and follows in clinic with Dr. [name]. He underwent LHC in February for chest pain and underwent PCI to the LAD x2 and D1. He was seen in clinic today for several weeks of progressive chest pain and dyspnea with exertion. His pain is a pressure sensation with radiation to his neck and L arm and abdomen. ECG in clinic was nonacute, but due to concern for possible ACS, he was sent to the ED.   In the ED, ECG showed NSR with no acute ischemic changes. Troponin was negative x1, and BNP 192. Hgb 8.1-8.5, although recent baseline ~9. CXR showed bibasilar atalectasis. Due to ongoing chest pain, he was given ASA 325, and SL NTG. Nitro gtt was ordered (but not started) and transferred to [facility] for further care.  On arrival to [facility], VSS and pt chest pain free with NTG infusion off. He reports that he has been having dyspnea and chest pressure with has been progressive over the past several months that worsened in the past week. He has taken multiple doses of NTG with improvement of his pain. He states that he is coughing up small amounts of clear sputum, which is new for him. He states that his weight is stable and R>L is at his recent baseline since his surgery. He states that he was only on home O2 from February to about April. He denies any active chest pain or  dyspnea.   Past Medical History:  Diagnosis Date  . Anemia    a. mild, noted 04/2017.  [name] CAD in native artery    a. [name] 04/2017 s/p DES to D1, DES to prox-mid LAD, DES to prox LAD overlapping the prior stent, LVEF 55-65%.   . Chronic diastolic CHF (congestive heart failure) ([place])   . Diabetic ulcer of toe ([place])   . DJD (degenerative joint disease) of cervical spine   . Essential hypertension, benign   . GERD (gastroesophageal reflux disease)   . Heart murmur   . History of hiatal hernia   . HIT (heparin-induced thrombocytopenia) ([place])   . Hypothyroidism   . Hypoxia    a. went home on home O2 04/2017.  [name] Insomnia   . Mixed hyperlipidemia   . PAD (peripheral artery disease) ([place])   . PAF (paroxysmal atrial fibrillation) ([place])   . PSVT (paroxysmal supraventricular tachycardia) ([place])   . PVD (peripheral vascular disease) ([place])   . Renal insufficiency   . 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  . Sinus bradycardia    a. HR 30s-40s in 04/2017 -> diltiazem stopped, metoprolol reduced.  . Sleep apnea    "chose not to order CPAP at this time" (05/18/2017)  . Type 2 diabetes mellitus ([place])   . Wheezing    a. suspected COPD 04/2017. Former  tobacco x 40 years.    Past Surgical History:  Procedure Laterality Date  . ABDOMINAL AORTOGRAM W/LOWER EXTREMITY N/A 09/15/2017   Procedure: ABDOMINAL AORTOGRAM W/LOWER EXTREMITY;  Surgeon: Serafina Mitchell, MD;  Location: [place] [facility];  Service: Cardiovascular;  Laterality: N/A;  . AMPUTATION Bilateral 09/20/2017   Procedure: BILATERAL GREAT TOE AMPUTATIONS INCLUDING METATARSAL HEADS;  Surgeon: Serafina Mitchell, MD;  Location: [facility];  Service: Vascular;  Laterality: Bilateral;  . ANGIOPLASTY Right 09/20/2017   Procedure: ANGIOPLASTY RIGHT TIBIAL ARTERY;  Surgeon: Serafina Mitchell, MD;  Location: [facility];  Service: Vascular;  Laterality: Right;  . APPENDECTOMY    . BACK SURGERY    . CARDIAC CATHETERIZATION  1980s; 2012;  . CATARACT EXTRACTION  Left 2004  . COLONOSCOPY  2004   FLEISHMAN TICS  . CORONARY ANGIOPLASTY WITH STENT PLACEMENT  05/18/2017   "3 stents"  . CORONARY STENT INTERVENTION N/A 05/18/2017   Procedure: CORONARY STENT INTERVENTION;  Surgeon: Jettie Booze, MD;  Location: [place] [facility];  Service: Cardiovascular;  Laterality: N/A;  . JOINT REPLACEMENT    . LEFT HEART CATH AND CORONARY ANGIOGRAPHY N/A 05/18/2017   Procedure: LEFT HEART CATH AND CORONARY ANGIOGRAPHY;  Surgeon: Jettie Booze, MD;  Location: [place] [facility];  Service: Cardiovascular;  Laterality: N/A;  . LOWER EXTREMITY ANGIOGRAM Right 09/20/2017   Procedure: RIGHT LOWER LEG  ANGIOGRAM;  Surgeon: Serafina Mitchell, MD;  Location: [place];  Service: Vascular;  Laterality: Right;  . LUMBAR FUSION  2002   L3, 4 L4, 5 L5 S1 Fused by Dr. Glenna Fellows  . PERIPHERAL VASCULAR BALLOON ANGIOPLASTY Left 09/15/2017   Procedure: PERIPHERAL VASCULAR BALLOON ANGIOPLASTY;  Surgeon: Serafina Mitchell, MD;  Location: [place] [facility];  Service: Cardiovascular;  Laterality: Left;  PTA of Peroneal & Posterior Tibial  . POSTERIOR LUMBAR FUSION    . RHINOPLASTY    . RIGHT HEART CATH N/A 05/18/2017   Procedure: RIGHT HEART CATH;  Surgeon: Jettie Booze, MD;  Location: [place] [facility];  Service: Cardiovascular;  Laterality: N/A;  . TEE WITHOUT CARDIOVERSION N/A 09/18/2017   Procedure: TRANSESOPHAGEAL ECHOCARDIOGRAM (TEE);  Surgeon: Fay Records, MD;  Location: [place];  Service: Cardiovascular;  Laterality: N/A;  . TOTAL KNEE ARTHROPLASTY Bilateral   . TRANSURETHRAL RESECTION OF PROSTATE  2001   [place]     Medications Prior to Admission: Prior to Admission medications   Medication Sig Start Date End Date Taking? Authorizing Provider  acetaminophen (TYLENOL) 500 MG tablet Take 500 mg by mouth as needed for mild pain or headache.    [provider]  albuterol (PROVENTIL) (2.5 MG/3ML) 0.083% nebulizer solution Inhale 3 mLs into the lungs  every 4 (four) hours as needed for wheezing or shortness of breath. 05/20/17   Daune Perch, NP  amLODipine (NORVASC) 10 MG tablet Take 1 tablet (10 mg total) by mouth daily. 06/05/17   Arnoldo Lenis, MD  apixaban (ELIQUIS) 5 MG TABS tablet Take 1 tablet (5 mg total) by mouth 2 (two) times daily. 11/23/15   Arnoldo Lenis, MD  atorvastatin (LIPITOR) 80 MG tablet Take 1 tablet (80 mg total) by mouth every evening. 05/19/17   Charlie Pitter, PA-C  Cholecalciferol (D3-1000 PO) Take 2,000 Units by mouth daily.    [provider]  clopidogrel (PLAVIX) 75 MG tablet Take 1 tablet (75 mg total) by mouth daily. 05/19/17   Dunn, Nedra Hai, PA-C  collagenase (SANTYL) ointment  Apply 1 application topically daily. 11/13/17   Serafina Mitchell, MD  Cyanocobalamin (B-12) 2500 MCG TABS Take 2,500 mcg by mouth daily.     [provider]  finasteride (PROSCAR) 5 MG tablet Take 5 mg by mouth daily.  04/17/15   [provider]  furosemide (LASIX) 20 MG tablet TAKE 1 TABLET DAILY AS NEEDED 08/15/17   Arnoldo Lenis, MD  insulin glargine (LANTUS) 100 UNIT/ML injection Inject 30-40 Units into the skin See admin instructions. Inject 40 units SQ in the morning and inject 30 units SQ at bedtime    [provider]  levalbuterol (XOPENEX HFA) 45 MCG/ACT inhaler Inhale 1 puff into the lungs every 6 (six) hours as needed for wheezing or shortness of breath. 05/19/17 05/19/18  Dunn, Lisbeth Renshaw N, PA-C  lisinopril (PRINIVIL,ZESTRIL) 5 MG tablet Take 1 tablet (5 mg total) by mouth daily. 06/27/17 11/17/17  Arnoldo Lenis, MD  meclizine (ANTIVERT) 12.5 MG tablet Take 1 tablet (12.5 mg total) by mouth 3 (three) times daily as needed for dizziness. 05/30/17   Richardson Dopp T, PA-C  metoprolol succinate (TOPROL-XL) 100 MG 24 hr tablet TAKE 1/2 TABLET EVERY DAY Patient taking differently: Take 100 mg by mouth daily. TAKE 1/2 TABLET EVERY DAY 05/24/17   Arnoldo Lenis, MD  nitroGLYCERIN (NITROSTAT) 0.4  MG SL tablet PLACE 1 TABLET (0.4 MG TOTAL) UNDER THE TONGUE EVERY 5  MINUTES AS NEEDED FOR CHEST PAIN. 05/22/17   Arnoldo Lenis, MD  pantoprazole (PROTONIX) 40 MG tablet Take 1 tablet (40 mg total) by mouth daily. To protect stomach while taking multiple blood thinners. 05/19/17   Dunn, Nedra Hai, PA-C  psyllium (METAMUCIL) 58.6 % packet Take 1 packet by mouth daily.    [provider]  sulfamethoxazole-trimethoprim (BACTRIM,SEPTRA) 400-80 MG tablet Take 1 tablet by mouth 2 (two) times daily. 11/13/17   Serafina Mitchell, MD  traZODone (DESYREL) 100 MG tablet Take 100 mg by mouth at bedtime.  03/25/16   [provider]     Allergies:    Allergies  Allergen Reactions  . Codeine Shortness Of Breath  . Heparin Other (See Comments)    +HIT,  Severe bleeding   . Losartan Swelling    Per ENT UNSPECIFIED REACTION   . Other Other (See Comments)    Severe bleeding UNSPECIFIED AGENT   . Oxycodone Other (See Comments)    "Made me act out of my mind" Other reaction(s): Other (See Comments) Mental status changes hallucinations    Social History:   Social History   Socioeconomic History  . Marital status: Married    Spouse name: Not on file  . Number of children: Not on file  . Years of education: Not on file  . Highest education level: Not on file  Occupational History  . Not on file  Social Needs  . Financial resource strain: Not on file  . Food insecurity:    Worry: Not on file    Inability: Not on file  . Transportation needs:    Medical: Not on file    Non-medical: Not on file  Tobacco Use  . Smoking status: Former Smoker    Packs/day: 2.00    Years: 30.00    Pack years: 60.00    Types: Cigarettes    Start date: 03/28/1953    Last attempt to quit: 03/29/1983    Years since quitting: 34.6  . Smokeless tobacco: Never Used  Substance and Sexual Activity  . Alcohol use:  Not Currently    Comment: 05/18/2017 'I drink a beer q yr"  . Drug use: No  . Sexual  activity: Not on file  Lifestyle  . Physical activity:    Days per week: Not on file    Minutes per session: Not on file  . Stress: Not on file  Relationships  . Social connections:    Talks on phone: Not on file    Gets together: Not on file    Attends religious service: Not on file    Active member of club or organization: Not on file    Attends meetings of clubs or organizations: Not on file    Relationship status: Not on file  . Intimate partner violence:    Fear of current or ex partner: Not on file    Emotionally abused: Not on file    Physically abused: Not on file    Forced sexual activity: Not on file  Other Topics Concern  . Not on file  Social History Narrative  . Not on file    Family History:   The patient's family history includes COPD in his father and mother; CVA in his sister; Diabetes in his mother and sister; Heart attack (age of onset: 76) in his father; Heart disease in his mother; Hypertension in his sister; Multiple sclerosis in his sister.    ROS:  Please see the history of present illness.  All other ROS reviewed and negative.     Physical Exam/Data:   Vitals:   11/17/17 1830 11/17/17 1900 11/17/17 1930 11/17/17 2000  BP: (!) 131/38 114/67 128/65 109/83  Pulse: (!) 48 76 86 (!) 117  Resp: 20 18 (!) 22 (!) 22  Temp:      TempSrc:      SpO2: (!) 80% 94% 95% (!) 81%  Weight:       No intake or output data in the 24 hours ending 11/17/17 2049 Filed Weights   11/17/17 1504  Weight: 98 kg   Body mass index is 33.83 kg/m.  General:  Well nourished, well developed, in no acute distress  HEENT: normal Lymph: no adenopathy Neck: no apparent JVD Endocrine:  No thryomegaly Cardiac:  normal S1, S2; RRR; II/VI systolic murmur   Lungs:  clear to auscultation bilaterally, no wheezing, rhonchi or rales  Abd: soft, nontender, no hepatomegaly  Ext: trace pitting LE edema Musculoskeletal:  No deformities, BUE and BLE strength normal and equal Skin:  warm and dry  Neuro:  No focal abnormalities noted Psych:  Normal affect    EKG:  The ECG that was done was personally reviewed and demonstrates NSR with no acute ischemic changes.   Relevant CV Studies: Diagnostic Studies   06/2015 Nuclear stress test  No diagnostic ST segment changes to indicate ischemia.  Small, mild intensity, perfusion defects noted in the apical anterior and basal inferolateral walls. This is most consistent with soft tissue attenuation given normal wall motion in these regions. No large ischemic zones noted.  This is a low risk study.  Nuclear stress EF: 64%.  05/2016 Event monitor  Rhythm is atrial fibrillation throughout study. Occasioanal PVCs  Min HR 51, Max HR 107, Avg HR 67  Reported symptoms correspond with rate controlled atrial fibrillation.  09/2016 nuclear stress  There was no ST segment deviation noted during stress.  The study is normal. There are no perfusion defects consistent with prior infarct or current ischemia.  This is a low risk study.  The left ventricular  ejection fraction is normal (55-65%).   04/2017 cath  Dist RCA lesion is 40% stenosed.  Prox RCA lesion is 25% stenosed.  1st Diag lesion is 75% stenosed.  A drug-eluting stent was successfully placed using a STENT SYNERGY DES 2.25X12.  Post intervention, there is a 0% residual stenosis.  Prox LAD to Mid LAD lesion is 75% stenosed.  A drug-eluting stent was successfully placed using a STENT SYNERGY DES 3.5X38.  Post intervention, there is a 0% residual stenosis.  Prox LAD lesion is 70% stenosed.  A drug-eluting stent was successfully placed using a STENT SYNERGY DES 4X12, overlapping the prior stent.  Post intervention, there is a 0% residual stenosis.  The left ventricular systolic function is normal.  The left ventricular ejection fraction is 55-65% by visual estimate.  LV end diastolic pressure is normal.  There is no aortic valve  stenosis.  Ao sat 98%, PA sat 65%; PA mean 25 mm Hg; unable to wedge the catheter.  Normal right heat pressures.  Tortuous right subclavian making catheter navigation difficult from the right radial approach  08/2017 TEE MV normal   Trace MR TV normal   MIld TR AV is mildly thickened, calcified   NO AI   No definite vegetation though cannot exclude colonization PV normal LVEF and RVEF normal No PFO by color doppler or with injection of agitated saline  LA, LAA without masses   Spontaneous contrast seen in LA MIld fixed plaquing of thoracic aorta.   08/2017 TTE Study Conclusions - Left ventricle: The cavity size was normal. Wall thickness was   increased in a pattern of moderate LVH. Systolic function was   normal. The estimated ejection fraction was in the range of 55%   to 60%. Wall motion was normal; there were no regional wall   motion abnormalities. - Aortic valve: Trileaflet; moderately thickened, moderately   calcified leaflets. Valve mobility was restricted. There was mild   stenosis. There was mild regurgitation. Peak velocity (S): 277   cm/s. Mean gradient (S): 16 mm Hg. Valve area (VTI): 2.01 cm^2.   Valve area (Vmax): 1.8 cm^2. Valve area (Vmean): 1.73 cm^2. - Mitral valve: There was mild regurgitation. Valve area by   pressure half-time: 0.93 cm^2. - Pulmonary arteries: Systolic pressure was mildly increased. PA   peak pressure: 44 mm Hg (S).   Laboratory Data:  Chemistry Recent Labs  Lab 11/17/17 1703  NA 139  K 4.5  CL 104  GLUCOSE 106*  BUN 18  CREATININE 1.10    No results for input(s): PROT, ALBUMIN, AST, ALT, ALKPHOS, BILITOT in the last 168 hours. Hematology Recent Labs  Lab 11/17/17 1548 11/17/17 1703  WBC 7.9  --   RBC 3.41*  --   HGB 8.1* 8.5*  HCT 26.6* 25.0*  MCV 78.0  --   MCH 23.8*  --   MCHC 30.5  --   RDW 17.4*  --   PLT 226  --    Cardiac EnzymesNo results for input(s): TROPONINI in the last 168 hours.  Recent Labs  Lab  11/17/17 1701  TROPIPOC 0.00    BNP Recent Labs  Lab 11/17/17 1548  BNP 192.0*    DDimer  Recent Labs  Lab 11/17/17 1548  DDIMER 0.48    Radiology/Studies:  Dg Chest 2 View  Result Date: 11/17/2017 CLINICAL DATA:  Shortness of breath.  Chest pains.  Cough. EXAM: CHEST - 2 VIEW COMPARISON:  09/04/2017. FINDINGS: Mediastinum and hilar structures normal. Mild bibasilar atelectasis/infiltrates.  No pleural effusion or pneumothorax. Stable mild cardiomegaly. No acute bony abnormality. IMPRESSION: Mild bibasilar atelectasis/infiltrates. Electronically Signed   By: Marcello Moores  Register   On: 11/17/2017 16:30    Assessment and Plan:   Chest pain, DOE CAD with prior PCI The patient has known CAD with multiple PCI earlier this year. He presents to the ED with several weeks of progressive chest discomfort and DOE. ECG and initial troponin are reassuring against MI at this time, although his presentation raises concern for possible ACS/UA. DDx also includes COPD exacerbation, given scant sputum production, although he does not have significant wheezing. He was given NTG in the ED with improvement of symptoms; NTG infusion was ordered but not started. At this time, will plan for rule out. Will likely need repeat ischemic evaluation. -Continue to trend troponin.  -S/p ASA 325. Will continue 81 mg daily for now (had been discontinued as he is on apixaban/clopidogrel). -Continue home clopidogrel -Nitro gtt ordered. Will restart as needed. -Unable to tolerate heparin due to prior HIT. Holding apixaban this time due to anticipated need for possible LHC. If CBM elevated, will need to consider alternate anticoagulation such as bivalrudin.  -Continue home atorvastatin -Continue home metoprolol  HFpEF It is possible that the patient's dHF may be contributing to his symptoms. He appears grossly euvolemic on exam and BNP elevated but decreased from prior measurement. He reports that his weight has been  stable. Cr 1.1, slightly above recent baseline. Unclear if this represents dehydration given above clinical picture. -Will hold on repeat echo for now. -Holding furosemide for now while monitoring renal function. Can diurese in AM based on trend in Cr and volume status.   COPD Followed by pulmonary as an outpatient. As above, it is unclear if there is an element of COPD exacerbation contributing to his symptoms. He has some sputum but no wheezing on my exam. -Duonebs ordered. -Holding on steroids for now -Continue home spiriva   Anemia The etiology of the patient's anemia is from blood loss during amputations in June. Hgb in low-mid 8s in the ED, although this is not far off from his recent baseline. It is unclear if low Hgb may be contributing to supply-demand mismatch. -Continue to monitor -Consider further workup based on clinical course.   Hypoxia Pt with O2 saturations recorded in the low 80s in the [facility] ED; he has normal saturations now on O2 via [place]. He has a history of hypoxia and was discharged home with O2 following his hospitalization in February 2019. He states that this requirement was due to his COPD and was discontinued after several months. -Continue to titrate as needed.   HTN -Continue home metoprolol, amlodipine -Holding home ACEI while on nitro gtt and given slight increase in creatinine from baseline. Can restart when confirmed to be stable.   DM On glargine 40 units qAM and 30 units qPM. -Decreasing glargine while hospitalized and adding SSI. Can titrate as needed.   HLD -Continue home atorvastatin  AF Currently in NSR -holding apixaban as above -Continue home metoprolol  PAD Was hospitalized in June for LE ischemia and required amputation. Follows with vascular as an outpatient. -Continue bactrim (recently started by vascular)  BPH -Continue home finasteride   Severity of Illness: The appropriate patient status for this patient is INPATIENT. Inpatient  status is judged to be reasonable and necessary in order to provide the required intensity of service to ensure the patient's safety. The patient's presenting symptoms, physical exam findings, and initial radiographic  and laboratory data in the context of their chronic comorbidities is felt to place them at high risk for further clinical deterioration. Furthermore, it is not anticipated that the patient will be medically stable for discharge from the hospital within 2 midnights of admission. The following factors support the patient status of inpatient.   " The patient's presenting symptoms include dyspnea. " The worrisome physical exam findings include na. " The initial radiographic and laboratory data are worrisome because of anemia. " The chronic co-morbidities include CAD.   * I certify that at the point of admission it is my clinical judgment that the patient will require inpatient hospital care spanning beyond 2 midnights from the point of admission due to high intensity of service, high risk for further deterioration and high frequency of surveillance required.*    For questions or updates, please contact [place] Please consult [email or web address] for contact info under Cardiology/STEMI.    Signed, Nila Nephew, MD  11/17/2017 8:49 PM

## 2017-11-17 NOTE — Progress Notes (Signed)
Pt arrived to unit via [facility]. Pt's dressing changed bilateral toes. Pt telemetry applied. Pt on 4L: [place]. CHG bath given. MRSA swab obtained. Cardiology paged and will arrive shortly. I will continue to monitor the pt.

## 2017-11-17 NOTE — ED Notes (Addendum)
Waiting for [facility] for transport. 

## 2017-11-17 NOTE — ED Provider Notes (Signed)
[facility] EMERGENCY DEPARTMENT Provider Note   CSN: [id number] Arrival date & time: 11/17/17  1450     History   Chief Complaint Chief Complaint  Patient presents with  . Shortness of Breath    HPI Mike Wright is a 84 y.o. male.  HPI   He presents for evaluation of dyspnea on exertion, present for 3 to 4 weeks, moderately severe, occurring when he walks from room to room at home.  Occasionally has chest discomfort which is sharp in nature and occurs sporadically, not necessarily with walking or periods of shortness of breath.  Patient states the pain radiates to the neck, left arm, and upper abdomen.  He went to see his cardiologist today who felt he needed ED evaluation.  His cardiologist was concerned about the chest discomfort, noting that he has a history of coronary artery disease and diastolic heart failure.  Patient also feels that he hurt his chest when he fell, 3 days ago, while walking.  He states that he has had multiple falls in the last 6 months.  There were no other injuries and the most recent fall.  He has a cough productive of white-colored sputum which is intermittent.  He does not take medications for wheezing.  He is on Eliquis for cardiac purposes.  There are no other known modifying factors.   Past Medical History:  Diagnosis Date  . Anemia    a. mild, noted 04/2017.  [name] CAD in native artery    a. [name] 04/2017 s/p DES to D1, DES to prox-mid LAD, DES to prox LAD overlapping the prior stent, LVEF 55-65%.   . Chronic diastolic CHF (congestive heart failure) ([place])   . Diabetic ulcer of toe ([place])   . DJD (degenerative joint disease) of cervical spine   . Essential hypertension, benign   . GERD (gastroesophageal reflux disease)   . Heart murmur   . History of hiatal hernia   . HIT (heparin-induced thrombocytopenia) ([place])   . Hypothyroidism   . Hypoxia    a. went home on home O2 04/2017.  [name] Insomnia   . Mixed hyperlipidemia   . PAD (peripheral artery  disease) ([place])   . PAF (paroxysmal atrial fibrillation) ([place])   . PSVT (paroxysmal supraventricular tachycardia) ([place])   . PVD (peripheral vascular disease) ([place])   . Renal insufficiency   . 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  . Sinus bradycardia    a. HR 30s-40s in 04/2017 -> diltiazem stopped, metoprolol reduced.  . Sleep apnea    "chose not to order CPAP at this time" (05/18/2017)  . Type 2 diabetes mellitus ([place])   . Wheezing    a. suspected COPD 04/2017. Former tobacco x 40 years.    Patient Active Problem List   Diagnosis Date Noted  . Chest pain 11/17/2017  . Gangrene ([place])   . Urinary retention 09/16/2017  . MRSA bacteremia 09/15/2017  . Gangrene of bilateral great toes ([place]) 09/13/2017  . COPD (chronic obstructive pulmonary disease) ([place]) 05/19/2017  . Anemia   . CAD (coronary artery disease)   . Chronic diastolic CHF (congestive heart failure) ([place])   . PSVT (paroxysmal supraventricular tachycardia) ([place])   . PAF (paroxysmal atrial fibrillation) ([place])   . Sleep apnea   . Type 2 diabetes mellitus ([place])   . Status post coronary artery stent placement   . Essential hypertension 04/15/2010  . Diabetes ([place]) 04/14/2010    Past Surgical History:  Procedure Laterality Date  .  ABDOMINAL AORTOGRAM W/LOWER EXTREMITY N/A 09/15/2017   Procedure: ABDOMINAL AORTOGRAM W/LOWER EXTREMITY;  Surgeon: Serafina Mitchell, MD;  Location: [place] CV LAB;  Service: Cardiovascular;  Laterality: N/A;  . AMPUTATION Bilateral 09/20/2017   Procedure: BILATERAL GREAT TOE AMPUTATIONS INCLUDING METATARSAL HEADS;  Surgeon: Serafina Mitchell, MD;  Location: [facility] OR;  Service: Vascular;  Laterality: Bilateral;  . ANGIOPLASTY Right 09/20/2017   Procedure: ANGIOPLASTY RIGHT TIBIAL ARTERY;  Surgeon: Serafina Mitchell, MD;  Location: [place];  Service: Vascular;  Laterality: Right;  . APPENDECTOMY    . BACK SURGERY    . CARDIAC CATHETERIZATION  1980s; 2012;  . CATARACT EXTRACTION Left 2004  .  COLONOSCOPY  2004   FLEISHMAN TICS  . CORONARY ANGIOPLASTY WITH STENT PLACEMENT  05/18/2017   "3 stents"  . CORONARY STENT INTERVENTION N/A 05/18/2017   Procedure: CORONARY STENT INTERVENTION;  Surgeon: Jettie Booze, MD;  Location: [place] CV LAB;  Service: Cardiovascular;  Laterality: N/A;  . JOINT REPLACEMENT    . LEFT HEART CATH AND CORONARY ANGIOGRAPHY N/A 05/18/2017   Procedure: LEFT HEART CATH AND CORONARY ANGIOGRAPHY;  Surgeon: Jettie Booze, MD;  Location: [place] CV LAB;  Service: Cardiovascular;  Laterality: N/A;  . LOWER EXTREMITY ANGIOGRAM Right 09/20/2017   Procedure: RIGHT LOWER LEG  ANGIOGRAM;  Surgeon: Serafina Mitchell, MD;  Location: [place];  Service: Vascular;  Laterality: Right;  . LUMBAR FUSION  2002   L3, 4 L4, 5 L5 S1 Fused by Dr. Glenna Fellows  . PERIPHERAL VASCULAR BALLOON ANGIOPLASTY Left 09/15/2017   Procedure: PERIPHERAL VASCULAR BALLOON ANGIOPLASTY;  Surgeon: Serafina Mitchell, MD;  Location: [place] [facility] LAB;  Service: Cardiovascular;  Laterality: Left;  PTA of Peroneal & Posterior Tibial  . POSTERIOR LUMBAR FUSION    . RHINOPLASTY    . RIGHT HEART CATH N/A 05/18/2017   Procedure: RIGHT HEART CATH;  Surgeon: Jettie Booze, MD;  Location: [place] [facility] LAB;  Service: Cardiovascular;  Laterality: N/A;  . TEE WITHOUT CARDIOVERSION N/A 09/18/2017   Procedure: TRANSESOPHAGEAL ECHOCARDIOGRAM (TEE);  Surgeon: Fay Records, MD;  Location: [facility]COPY;  Service: Cardiovascular;  Laterality: N/A;  . TOTAL KNEE ARTHROPLASTY Bilateral   . TRANSURETHRAL RESECTION OF [place] Medications    Prior to Admission medications   Medication Sig Start Date End Date Taking? Authorizing Provider  acetaminophen (TYLENOL) 500 MG tablet Take 500 mg by mouth as needed for mild pain or headache.    [provider]  albuterol (PROVENTIL) (2.5 MG/3ML) 0.083% nebulizer solution Inhale 3 mLs into the lungs every 4 (four) hours  as needed for wheezing or shortness of breath. 05/20/17   Daune Perch, NP  amLODipine (NORVASC) 10 MG tablet Take 1 tablet (10 mg total) by mouth daily. 06/05/17   Arnoldo Lenis, MD  apixaban (ELIQUIS) 5 MG TABS tablet Take 1 tablet (5 mg total) by mouth 2 (two) times daily. 11/23/15   Arnoldo Lenis, MD  atorvastatin (LIPITOR) 80 MG tablet Take 1 tablet (80 mg total) by mouth every evening. 05/19/17   Charlie Pitter, PA-C  Cholecalciferol (D3-1000 PO) Take 2,000 Units by mouth daily.    [provider]  clopidogrel (PLAVIX) 75 MG tablet Take 1 tablet (75 mg total) by mouth daily. 05/19/17   Dunn, Nedra Hai, PA-C  collagenase (SANTYL) ointment Apply 1 application topically daily. 11/13/17   Serafina Mitchell, MD  Cyanocobalamin (B-12) 2500 MCG TABS Take 2,500 mcg by mouth daily.     [provider]  finasteride (PROSCAR) 5 MG tablet Take 5 mg by mouth daily.  04/17/15   [provider]  furosemide (LASIX) 20 MG tablet TAKE 1 TABLET DAILY AS NEEDED 08/15/17   Arnoldo Lenis, MD  insulin glargine (LANTUS) 100 UNIT/ML injection Inject 30-40 Units into the skin See admin instructions. Inject 40 units SQ in the morning and inject 30 units SQ at bedtime    [provider]  levalbuterol (XOPENEX HFA) 45 MCG/ACT inhaler Inhale 1 puff into the lungs every 6 (six) hours as needed for wheezing or shortness of breath. 05/19/17 05/19/18  Dunn, Lisbeth Renshaw N, PA-C  lisinopril (PRINIVIL,ZESTRIL) 5 MG tablet Take 1 tablet (5 mg total) by mouth daily. 06/27/17 11/17/17  Arnoldo Lenis, MD  meclizine (ANTIVERT) 12.5 MG tablet Take 1 tablet (12.5 mg total) by mouth 3 (three) times daily as needed for dizziness. 05/30/17   [place] Dopp T, PA-C  metoprolol succinate (TOPROL-XL) 100 MG 24 hr tablet TAKE 1/2 TABLET EVERY DAY Patient taking differently: Take 100 mg by mouth daily. TAKE 1/2 TABLET EVERY DAY 05/24/17   Arnoldo Lenis, MD  nitroGLYCERIN (NITROSTAT) 0.4 MG SL tablet PLACE 1  TABLET (0.4 MG TOTAL) UNDER THE TONGUE EVERY 5  MINUTES AS NEEDED FOR CHEST PAIN. 05/22/17   Arnoldo Lenis, MD  pantoprazole (PROTONIX) 40 MG tablet Take 1 tablet (40 mg total) by mouth daily. To protect stomach while taking multiple blood thinners. 05/19/17   Dunn, Nedra Hai, PA-C  psyllium (METAMUCIL) 58.6 % packet Take 1 packet by mouth daily.    [provider]  sulfamethoxazole-trimethoprim (BACTRIM,SEPTRA) 400-80 MG tablet Take 1 tablet by mouth 2 (two) times daily. 11/13/17   Serafina Mitchell, MD  traZODone (DESYREL) 100 MG tablet Take 100 mg by mouth at bedtime.  03/25/16   [provider]    Family History Family History  Problem Relation Age of Onset  . Heart attack Father 37  . COPD Father   . COPD Mother   . Heart disease Mother   . Diabetes Mother   . Hypertension Sister   . CVA Sister   . Diabetes Sister   . Multiple sclerosis Sister     Social History Social History   Tobacco Use  . Smoking status: Former Smoker    Packs/day: 2.00    Years: 30.00    Pack years: 60.00    Types: Cigarettes    Start date: 03/28/1953    Last attempt to quit: 03/29/1983    Years since quitting: 34.6  . Smokeless tobacco: Never Used  Substance Use Topics  . Alcohol use: Not Currently    Comment: 05/18/2017 'I drink a beer q yr"  . Drug use: No     Allergies   Codeine; Heparin; Losartan; Other; and Oxycodone   Review of Systems Review of Systems  All other systems reviewed and are negative.    Physical Exam Updated Vital Signs BP 128/65   Pulse 86   Temp 98.3 F (36.8 C) (Oral)   Resp (!) 22   Wt 98 kg   SpO2 95%   BMI 33.83 kg/m   Physical Exam  Constitutional: He is oriented to person, place, and time. He appears well-developed.  Elderly, frail  HENT:  Head: Normocephalic and atraumatic.  Right Ear: External ear normal.  Left Ear: External ear normal.  Eyes: Pupils are equal, round,  and reactive to light. Conjunctivae and EOM are normal.    Neck: Normal range of motion and phonation normal. Neck supple.  Cardiovascular: Normal rate, regular rhythm and normal heart sounds.  Pulmonary/Chest: Effort normal and breath sounds normal. No stridor. No respiratory distress. He has no wheezes. He has no rales. He exhibits no tenderness and no bony tenderness.  Abdominal: Soft. There is no tenderness.  Musculoskeletal: Normal range of motion. He exhibits edema (Right greater than left lower leg, 1-2+.). He exhibits no tenderness or deformity.  Neurological: He is alert and oriented to person, place, and time. No cranial nerve deficit or sensory deficit. He exhibits normal muscle tone. Coordination normal.  Skin: Skin is warm, dry and intact.  Psychiatric: He has a normal mood and affect. His behavior is normal. Judgment and thought content normal.  Nursing note and vitals reviewed.    ED Treatments / Results  Labs (all labs ordered are listed, but only abnormal results are displayed) Labs Reviewed  CBC WITH DIFFERENTIAL/PLATELET - Abnormal; Notable for the following components:      Result Value   RBC 3.41 (*)    Hemoglobin 8.1 (*)    HCT 26.6 (*)    MCH 23.8 (*)    RDW 17.4 (*)    Monocytes Absolute 1.9 (*)    All other components within normal limits  BRAIN NATRIURETIC PEPTIDE - Abnormal; Notable for the following components:   B Natriuretic Peptide 192.0 (*)    All other components within normal limits  I-STAT CHEM 8, ED - Abnormal; Notable for the following components:   Glucose, Bld 106 (*)    Hemoglobin 8.5 (*)    HCT 25.0 (*)    All other components within normal limits  D-DIMER, QUANTITATIVE (NOT AT [facility])  OCCULT BLOOD X 1 CARD TO LAB, STOOL  I-STAT TROPONIN, ED  POC OCCULT BLOOD, ED    EKG EKG Interpretation  Date/Time:  Friday November 17 2017 15:08:16 EDT Ventricular Rate:  76 PR Interval:    QRS Duration: 92 QT Interval:  406 QTC Calculation: 457 R Axis:   34 Text Interpretation:  Sinus rhythm Prolonged  PR interval since last tracing no significant change Confirmed by Daleen Bo [phone]) on 11/17/2017 3:24:00 PM   Radiology Dg Chest 2 View  Result Date: 11/17/2017 CLINICAL DATA:  Shortness of breath.  Chest pains.  Cough. EXAM: CHEST - 2 VIEW COMPARISON:  09/04/2017. FINDINGS: Mediastinum and hilar structures normal. Mild bibasilar atelectasis/infiltrates. No pleural effusion or pneumothorax. Stable mild cardiomegaly. No acute bony abnormality. IMPRESSION: Mild bibasilar atelectasis/infiltrates. Electronically Signed   By: Marcello Moores  Register   On: 11/17/2017 16:30    Procedures .Critical Care Performed by: Daleen Bo, MD Authorized by: Daleen Bo, MD   Critical care provider statement:    Critical care time (minutes):  35   Critical care start time:  11/17/2017 3:05 PM   Critical care end time:  11/17/2017 6:41 PM   Critical care time was exclusive of:  Separately billable procedures and treating other patients   Critical care was necessary to treat or prevent imminent or life-threatening deterioration of the following conditions:  Circulatory failure   Critical care was time spent personally by me on the following activities:  Blood draw for specimens, development of treatment plan with patient or surrogate, discussions with consultants, evaluation of patient's response to treatment, examination of patient, obtaining history from patient or surrogate, ordering and performing treatments and interventions, ordering and review of laboratory studies, pulse  oximetry, re-evaluation of patient's condition, review of old charts and ordering and review of radiographic studies   (including critical care time)  Medications Ordered in ED Medications  nitroGLYCERIN 50 mg in dextrose 5 % 250 mL (0.2 mg/mL) infusion (has no administration in time range)  aspirin chewable tablet 324 mg (has no administration in time range)  nitroGLYCERIN (NITROSTAT) SL tablet 0.4 mg (0.4 mg Sublingual Given  11/17/17 1855)     Initial Impression / Assessment and Plan / ED Course  I have reviewed the triage vital signs and the nursing notes.  Pertinent labs & imaging results that were available during my care of the patient were reviewed by me and considered in my medical decision making (see chart for details).  Clinical Course as of Nov 18 1943  Fri Nov 17, 2017  1725 Normal except glucose high, hemoglobin low  I-stat Chem 8, ED(!) [EW]  1725 I-stat troponin, ED [EW]  1730 Normal except hemoglobin low, hematocrit low, MCH low, RDW elevated  CBC with Differential(!) [EW]  1731 Mild elevation  Brain natriuretic peptide(!) [EW]  1731 Normal  D-dimer, quantitative [EW]  1731 Bibasal atelectasis versus infiltrates.  Images reviewed by me.  DG Chest 2 View [EW]  5102 I was preparing for admission for the patient, when I noticed that the pulse rate and oxygen saturation had suddenly changed.  I entered the patient's room and he was breathing rapidly, and the O2 sat monitor did not appear to be showing a normal waveform.  The monitor was replaced on another finger in the oxygen saturation on room air was 97%, normal.  Pulse rate, right radial artery, by me at the time of this evaluation was 68 beats per minutes.  At this time the patient complained of chest pain.  He requested a nitroglycerin.  This was ordered, and given by nursing.   [EW]  1912 After nitroglycerin, patient states pain decreased from 6 to 3 over 10.  He feels more comfortable at this time.   [EW]  1913 Will discuss case with cardiology relative to appropriate placement, here versus in [facility] for observation and treatment.   [EW]  1925 Nitroglycerin drip ordered to help control chest pain, and aspirin given for antiplatelet effect.   [EW]    Clinical Course User Index [EW] Daleen Bo, MD     Patient Vitals for the past 24 hrs:  BP Temp Temp src Pulse Resp SpO2 Weight  11/17/17 1930 128/65 - - 86 (!) 22 95 % -    11/17/17 1900 114/67 - - 76 18 94 % -  11/17/17 1830 (!) 131/38 - - (!) 48 20 (!) 80 % -  11/17/17 1800 (!) 119/59 - - 75 (!) 22 95 % -  11/17/17 1730 136/75 - - 73 (!) 26 94 % -  11/17/17 1708 (!) 119/57 - - 75 17 97 % -  11/17/17 1600 137/70 - - 78 19 97 % -  11/17/17 1530 139/74 - - 75 (!) 28 (!) 88 % -  11/17/17 1504 - - - - - - 98 kg  11/17/17 1503 127/71 98.3 F (36.8 C) Oral 76 (!) 22 98 % -    6:41 PM Reevaluation with update and discussion. After initial assessment and treatment, an updated evaluation reveals he remains comfortable.  Vital signs stable.Daleen Bo   Medical Decision Making: Dyspnea on exertion, with history of coronary artery disease and diastolic heart failure.  EKG is reassuring.  Initial troponin normal.  BNP is mildly elevated.  Patient with anemia, likely primary source of dyspnea.  He had bilateral great toe amputations, about 7 weeks ago during which time he had significant blood loss, coronary transfusion with 1 unit of blood.  His hemoglobin has dropped from discharge, indicating that he is not manufacturing red blood cells or using red cells.  Initial testing for GI bleeding negative in the ED.  Would recommend screening with daily stool testing x3 for occult GI bleeding.  He may ultimately need hematologic evaluation.  Unfortunately patient is anticoagulated.  Chest x-ray somewhat abnormal, patient with cough.  Doubt pneumonia, bronchitis or acute uncompensated congestive heart failure.  Do not suspect active bleeding, or hemodynamic instability.  Blood transfusion ordered for symptomatic anemia, and requested hospitalist service observe patient overnight with cycling troponin.  CRITICAL CARE-this Performed by: Daleen Bo   Nursing Notes Reviewed/ Care Coordinated Applicable Imaging Reviewed Interpretation of Laboratory Data incorporated into ED treatment   7:15 PM-requested callback from cardiology regarding disposition.  Cardiology called back  and accepted patient for admission to the stepdown unit at [facility].  7:35 PM Plan: Admit  Final Clinical Impressions(s) / ED Diagnoses   Final diagnoses:  Chest pain, unspecified type  Anemia, unspecified type    ED Discharge Orders    None       Daleen Bo, MD 11/17/17 1945

## 2017-11-18 DIAGNOSIS — D509 Iron deficiency anemia, unspecified: Secondary | ICD-10-CM | POA: Diagnosis present

## 2017-11-18 DIAGNOSIS — R072 Precordial pain: Secondary | ICD-10-CM

## 2017-11-18 DIAGNOSIS — R634 Abnormal weight loss: Secondary | ICD-10-CM | POA: Diagnosis not present

## 2017-11-18 DIAGNOSIS — R110 Nausea: Secondary | ICD-10-CM | POA: Diagnosis not present

## 2017-11-18 DIAGNOSIS — D508 Other iron deficiency anemias: Secondary | ICD-10-CM | POA: Diagnosis not present

## 2017-11-18 DIAGNOSIS — E782 Mixed hyperlipidemia: Secondary | ICD-10-CM | POA: Diagnosis not present

## 2017-11-18 DIAGNOSIS — E8770 Fluid overload, unspecified: Secondary | ICD-10-CM | POA: Diagnosis present

## 2017-11-18 DIAGNOSIS — I25119 Atherosclerotic heart disease of native coronary artery with unspecified angina pectoris: Secondary | ICD-10-CM | POA: Diagnosis not present

## 2017-11-18 DIAGNOSIS — D649 Anemia, unspecified: Secondary | ICD-10-CM | POA: Diagnosis present

## 2017-11-18 DIAGNOSIS — I110 Hypertensive heart disease with heart failure: Secondary | ICD-10-CM | POA: Diagnosis not present

## 2017-11-18 DIAGNOSIS — D500 Iron deficiency anemia secondary to blood loss (chronic): Secondary | ICD-10-CM | POA: Diagnosis not present

## 2017-11-18 DIAGNOSIS — Z7902 Long term (current) use of antithrombotics/antiplatelets: Secondary | ICD-10-CM | POA: Diagnosis not present

## 2017-11-18 DIAGNOSIS — R6881 Early satiety: Secondary | ICD-10-CM | POA: Diagnosis not present

## 2017-11-18 DIAGNOSIS — R1084 Generalized abdominal pain: Secondary | ICD-10-CM | POA: Diagnosis not present

## 2017-11-18 DIAGNOSIS — Z7901 Long term (current) use of anticoagulants: Secondary | ICD-10-CM | POA: Diagnosis not present

## 2017-11-18 DIAGNOSIS — E039 Hypothyroidism, unspecified: Secondary | ICD-10-CM | POA: Diagnosis not present

## 2017-11-18 DIAGNOSIS — I480 Paroxysmal atrial fibrillation: Secondary | ICD-10-CM | POA: Diagnosis not present

## 2017-11-18 DIAGNOSIS — I5032 Chronic diastolic (congestive) heart failure: Secondary | ICD-10-CM | POA: Diagnosis not present

## 2017-11-18 LAB — CBC
HEMATOCRIT: 24.9 % — AB (ref 39.0–52.0)
Hemoglobin: 7.5 g/dL — ABNORMAL LOW (ref 13.0–17.0)
MCH: 23.9 pg — ABNORMAL LOW (ref 26.0–34.0)
MCHC: 30.1 g/dL (ref 30.0–36.0)
MCV: 79.3 fL (ref 78.0–100.0)
PLATELETS: 218 10*3/uL (ref 150–400)
RBC: 3.14 MIL/uL — ABNORMAL LOW (ref 4.22–5.81)
RDW: 17.2 % — ABNORMAL HIGH (ref 11.5–15.5)
WBC: 6 10*3/uL (ref 4.0–10.5)

## 2017-11-18 LAB — TROPONIN I

## 2017-11-18 LAB — BASIC METABOLIC PANEL
Anion gap: 7 (ref 5–15)
BUN: 19 mg/dL (ref 8–23)
CALCIUM: 8.5 mg/dL — AB (ref 8.9–10.3)
CO2: 24 mmol/L (ref 22–32)
CREATININE: 1.18 mg/dL (ref 0.61–1.24)
Chloride: 106 mmol/L (ref 98–111)
GFR calc Af Amer: >60 mL/min (ref >60)
GFR, EST NON AFRICAN AMERICAN: 56 mL/min — AB (ref >60)
GLUCOSE: 134 mg/dL — AB (ref 70–99)
Potassium: 4.2 mmol/L (ref 3.5–5.1)
SODIUM: 137 mmol/L (ref 135–145)

## 2017-11-18 LAB — LIPID PANEL
CHOL/HDL RATIO: 4.1 ratio
Cholesterol: 65 mg/dL (ref 0–200)
HDL: 16 mg/dL — ABNORMAL LOW (ref >40)
LDL Cholesterol: 32 mg/dL (ref 0–99)
Triglycerides: 85 mg/dL (ref <150)
VLDL: 17 mg/dL (ref 0–40)

## 2017-11-18 LAB — GLUCOSE, CAPILLARY
GLUCOSE-CAPILLARY: 97 mg/dL (ref 70–99)
GLUCOSE-CAPILLARY: 141 mg/dL — AB (ref 70–99)
Glucose-Capillary: 89 mg/dL (ref 70–99)
Glucose-Capillary: 99 mg/dL (ref 70–99)

## 2017-11-18 LAB — PREPARE RBC (CROSSMATCH)

## 2017-11-18 LAB — MRSA PCR SCREENING: MRSA by PCR: NEGATIVE

## 2017-11-18 MED ORDER — FUROSEMIDE 10 MG/ML IJ SOLN
20.0000 mg | Freq: Once | INTRAMUSCULAR | Status: AC
  Administered 2017-11-18: 20 mg via INTRAVENOUS

## 2017-11-18 MED ORDER — DIPHENHYDRAMINE HCL 25 MG PO CAPS
25.0000 mg | ORAL_CAPSULE | Freq: Once | ORAL | Status: AC
  Administered 2017-11-18: 25 mg via ORAL
  Filled 2017-11-18: qty 1

## 2017-11-18 MED ORDER — FUROSEMIDE 10 MG/ML IJ SOLN
40.0000 mg | Freq: Once | INTRAMUSCULAR | Status: AC
  Administered 2017-11-18: 40 mg via INTRAVENOUS
  Filled 2017-11-18: qty 4

## 2017-11-18 MED ORDER — FUROSEMIDE 10 MG/ML IJ SOLN
20.0000 mg | Freq: Once | INTRAMUSCULAR | Status: DC
  Filled 2017-11-18: qty 2

## 2017-11-18 MED ORDER — GI COCKTAIL ~~LOC~~
30.0000 mL | Freq: Two times a day (BID) | ORAL | Status: DC | PRN
  Administered 2017-11-18: 30 mL via ORAL
  Filled 2017-11-18: qty 30

## 2017-11-18 MED ORDER — SODIUM CHLORIDE 0.9% IV SOLUTION
Freq: Once | INTRAVENOUS | Status: AC
  Administered 2017-11-18: 12:00:00 via INTRAVENOUS

## 2017-11-18 NOTE — Consult Note (Signed)
[facility] Consult: 12:02 PM 11/18/2017  LOS: 1 day    Referring Provider: Dr Sallyanne Kuster  Primary Care Physician:  Medicine, [facility] Primary Gastroenterologist:  unassigned     Reason for Consultation:  Dark stool.  anemia   HPI: Mike Wright is a 84 y.o. male.  Hx CAD.  PAD.  Diastolic CHF.  DM.  HTN.  OSA, not on CPAP.  S/p 3 DES to LAD in 04/2017.   Colonoscopy 2004 by Dr Leanora Ivanoff in [place]. Diverticulosis.  EGD 2016 by Dr. Arlester Marker in [place].  For evaluation of globus sensation.  Per patient report this was an unremarkable study.  S/p bil hallux amputations 09/20/17.  S/p right LE angiogram with balloon angioplasty of peroneal and posterior tibial  arteries 09/20/17.  Postop on 09/22/2017, Hgb went from baseline of about 9 to 7.4.  He received 1 unit PRBCs.  Hgb up to 9 on 09/25/2017. On Plavix, not on hold, last dose this morning and Eliquis, this is on hold Patient has not noticed any bleeding from his amputation sites and scars are healing well. For about 3 weeks patient's stools have been dark, formed, not bloody or melenic.  For about 4 weeks he has had postprandial nausea without emesis and diffuse abdominal pain primarily in the mid abdominal region.  Symptoms last 6 to 8 hours area.  This is associated with early satiety.  Not eating as much.  Also has had intermittent pain bilaterally in the lower abdomen/pelvic region and intermittent pain in his left upper quadrant.  Both of these pains are not associated with activity or p.o. intake.   Weight dropped from 239# to 213# over the past 12 months  Admitted 2 d ago with progressive chest pain, radiating to neck, left arm.  CP resolved with NTG IV.   EKG negative for acute ischemia.  Troponin I negative x 3..   BNP 192.    Hgb 8.1.   No renal  insufficiency.  LFTs not obtained, they were last assayed on 09/14/2017 and AST/ALT 50/42.  Normal T bili and normal alk phos. Stool FOBT negative yesterday 8/23 There is been no abdominal pelvic imaging in recent weeks or in years past Home medications, in addition to Plavix and Eliquis includes Protonix 40 mg daily.  No ASA, NSAIDs, EtOH.  Cardiology, Dr Crissie Sickles is suspicious that his chest pain is GI in origin.  There is no plans for further cardiac work-up.  Transfusion with 2 U PRBC ordered.    Past Medical History:  Diagnosis Date  . Anemia    a. mild, noted 04/2017.  [name] CAD in native artery    a. [name] 04/2017 s/p DES to D1, DES to prox-mid LAD, DES to prox LAD overlapping the prior stent, LVEF 55-65%.   . Chronic diastolic CHF (congestive heart failure) ([place])   . Diabetic ulcer of toe ([place])   . DJD (degenerative joint disease) of cervical spine   . Essential hypertension, benign   . GERD (gastroesophageal reflux disease)   . Heart murmur   .  History of hiatal hernia   . HIT (heparin-induced thrombocytopenia) ([place])   . Hypothyroidism   . Hypoxia    a. went home on home O2 04/2017.  [name] Insomnia   . Mixed hyperlipidemia   . PAD (peripheral artery disease) ([place])   . PAF (paroxysmal atrial fibrillation) ([place])   . PSVT (paroxysmal supraventricular tachycardia) ([place])   . PVD (peripheral vascular disease) ([place])   . Renal insufficiency   . 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  . Sinus bradycardia    a. HR 30s-40s in 04/2017 -> diltiazem stopped, metoprolol reduced.  . Sleep apnea    "chose not to order CPAP at this time" (05/18/2017)  . Type 2 diabetes mellitus ([place])   . Wheezing    a. suspected COPD 04/2017. Former tobacco x 40 years.    Past Surgical History:  Procedure Laterality Date  . ABDOMINAL AORTOGRAM W/LOWER EXTREMITY N/A 09/15/2017   Procedure: ABDOMINAL AORTOGRAM W/LOWER EXTREMITY;  Surgeon: Serafina Mitchell, MD;  Location: [facility];  Service:  Cardiovascular;  Laterality: N/A;  . AMPUTATION Bilateral 09/20/2017   Procedure: BILATERAL GREAT TOE AMPUTATIONS INCLUDING METATARSAL HEADS;  Surgeon: Serafina Mitchell, MD;  Location: [facility];  Service: Vascular;  Laterality: Bilateral;  . ANGIOPLASTY Right 09/20/2017   Procedure: ANGIOPLASTY RIGHT TIBIAL ARTERY;  Surgeon: Serafina Mitchell, MD;  Location: [facility];  Service: Vascular;  Laterality: Right;  . APPENDECTOMY    . BACK SURGERY    . CARDIAC CATHETERIZATION  1980s; 2012;  . CATARACT EXTRACTION Left 2004  . COLONOSCOPY  2004   FLEISHMAN TICS  . CORONARY ANGIOPLASTY WITH STENT PLACEMENT  05/18/2017   "3 stents"  . CORONARY STENT INTERVENTION N/A 05/18/2017   Procedure: CORONARY STENT INTERVENTION;  Surgeon: Jettie Booze, MD;  Location: [facility] [facility];  Service: Cardiovascular;  Laterality: N/A;  . JOINT REPLACEMENT    . LEFT HEART CATH AND CORONARY ANGIOGRAPHY N/A 05/18/2017   Procedure: LEFT HEART CATH AND CORONARY ANGIOGRAPHY;  Surgeon: Jettie Booze, MD;  Location: [place] [facility];  Service: Cardiovascular;  Laterality: N/A;  . LOWER EXTREMITY ANGIOGRAM Right 09/20/2017   Procedure: RIGHT LOWER LEG  ANGIOGRAM;  Surgeon: Serafina Mitchell, MD;  Location: [facility];  Service: Vascular;  Laterality: Right;  . LUMBAR FUSION  2002   L3, 4 L4, 5 L5 S1 Fused by Dr. Glenna Fellows  . PERIPHERAL VASCULAR BALLOON ANGIOPLASTY Left 09/15/2017   Procedure: PERIPHERAL VASCULAR BALLOON ANGIOPLASTY;  Surgeon: Serafina Mitchell, MD;  Location: [facility];  Service: Cardiovascular;  Laterality: Left;  PTA of Peroneal & Posterior Tibial  . POSTERIOR LUMBAR FUSION    . RHINOPLASTY    . RIGHT HEART CATH N/A 05/18/2017   Procedure: RIGHT HEART CATH;  Surgeon: Jettie Booze, MD;  Location: [place] [facility];  Service: Cardiovascular;  Laterality: N/A;  . TEE WITHOUT CARDIOVERSION N/A 09/18/2017   Procedure: TRANSESOPHAGEAL ECHOCARDIOGRAM (TEE);  Surgeon: Fay Records, MD;  Location: [facility]  ENDOSCOPY;  Service: Cardiovascular;  Laterality: N/A;  . TOTAL KNEE ARTHROPLASTY Bilateral   . TRANSURETHRAL RESECTION OF PROSTATE  2001   [place]    Prior to Admission medications   Medication Sig Start Date End Date Taking? Authorizing Provider  acetaminophen (TYLENOL) 500 MG tablet Take 500 mg by mouth as needed for mild pain or headache.   Yes [provider]  albuterol (PROVENTIL) (2.5 MG/3ML) 0.083% nebulizer solution Inhale 3 mLs into  the lungs every 4 (four) hours as needed for wheezing or shortness of breath. 05/20/17  Yes Daune Perch, NP  amLODipine (NORVASC) 10 MG tablet Take 1 tablet (10 mg total) by mouth daily. 06/05/17  Yes Branch, Alphonse Guild, MD  apixaban (ELIQUIS) 5 MG TABS tablet Take 1 tablet (5 mg total) by mouth 2 (two) times daily. 11/23/15  Yes Arnoldo Lenis, MD  atorvastatin (LIPITOR) 80 MG tablet Take 1 tablet (80 mg total) by mouth every evening. 05/19/17  Yes Dunn, Nedra Hai, PA-C  Cholecalciferol (D3-1000 PO) Take 1,000 Units by mouth daily.    Yes [provider]  clopidogrel (PLAVIX) 75 MG tablet Take 1 tablet (75 mg total) by mouth daily. 05/19/17  Yes Dunn, Dayna N, PA-C  collagenase (SANTYL) ointment Apply 1 application topically daily. 11/13/17  Yes Serafina Mitchell, MD  Cyanocobalamin (B-12 PO) Take 1 tablet by mouth daily.    Yes [provider]  finasteride (PROSCAR) 5 MG tablet Take 5 mg by mouth daily.  04/17/15  Yes [provider]  furosemide (LASIX) 20 MG tablet TAKE 1 TABLET DAILY AS NEEDED Patient taking differently: Take 20 mg by mouth daily.  08/15/17  Yes BranchAlphonse Guild, MD  insulin glargine (LANTUS) 100 UNIT/ML injection Inject 30-40 Units into the skin See admin instructions. Inject 40 units SQ in the morning and inject 30 units SQ at bedtime   Yes [provider]  lisinopril (PRINIVIL,ZESTRIL) 5 MG tablet Take 1 tablet (5 mg total) by mouth daily. 06/27/17 11/18/17 Yes BranchAlphonse Guild, MD    metFORMIN (GLUCOPHAGE) 1000 MG tablet Take 1,000 mg by mouth 2 (two) times daily. 05/22/17  Yes [provider]  metoprolol succinate (TOPROL-XL) 100 MG 24 hr tablet TAKE 1/2 TABLET EVERY DAY Patient taking differently: Take 100 mg by mouth daily.  05/24/17  Yes Branch, Alphonse Guild, MD  nitroGLYCERIN (NITROSTAT) 0.4 MG SL tablet PLACE 1 TABLET (0.4 MG TOTAL) UNDER THE TONGUE EVERY 5  MINUTES AS NEEDED FOR CHEST PAIN. Patient taking differently: Place 0.4 mg under the tongue every 5 (five) minutes as needed.  05/22/17  Yes BranchAlphonse Guild, MD  pantoprazole (PROTONIX) 40 MG tablet Take 1 tablet (40 mg total) by mouth daily. To protect stomach while taking multiple blood thinners. 05/19/17  Yes Dunn, Dayna N, PA-C  psyllium (METAMUCIL) 58.6 % packet Take 1 packet by mouth daily.   Yes [provider]  sulfamethoxazole-trimethoprim (BACTRIM,SEPTRA) 400-80 MG tablet Take 1 tablet by mouth 2 (two) times daily. 11/13/17  Yes Serafina Mitchell, MD  traZODone (DESYREL) 100 MG tablet Take 100 mg by mouth at bedtime.  03/25/16  Yes [provider]  meclizine (ANTIVERT) 12.5 MG tablet Take 1 tablet (12.5 mg total) by mouth 3 (three) times daily as needed for dizziness. 05/30/17   Richardson Dopp T, PA-C    Scheduled Meds: . sodium chloride   Intravenous Once  . amLODipine  10 mg Oral Daily  . atorvastatin  80 mg Oral QPM  . clopidogrel  75 mg Oral Daily  . collagenase  1 application Topical Daily  . finasteride  5 mg Oral Daily  . furosemide  20 mg Intravenous Once  . furosemide  20 mg Intravenous Once  . insulin aspart  0-15 Units Subcutaneous TID WC  . insulin glargine  20 Units Subcutaneous BID  . metoprolol succinate  100 mg Oral Daily  . pantoprazole  40 mg Oral Daily  . psyllium  1 packet  Oral Daily  . sulfamethoxazole-trimethoprim  1 tablet Oral BID  . tiotropium  18 mcg Inhalation Daily  . traZODone  100 mg Oral QHS   Infusions: . nitroGLYCERIN     PRN  Meds: acetaminophen, gi cocktail, ipratropium-albuterol, nitroGLYCERIN, ondansetron (ZOFRAN) IV   Allergies as of 11/17/2017 - Review Complete 11/17/2017  Allergen Reaction Noted  . Codeine Shortness Of Breath   . Heparin Other (See Comments) 10/20/2016  . Losartan Swelling 05/24/2017  . Other Other (See Comments) 10/20/2016  . Oxycodone Other (See Comments) 10/20/2016    Family History  Problem Relation Age of Onset  . Heart attack Father 63  . COPD Father   . COPD Mother   . Heart disease Mother   . Diabetes Mother   . Hypertension Sister   . CVA Sister   . Diabetes Sister   . Multiple sclerosis Sister     Social History   Socioeconomic History  . Marital status: Married    Spouse name: Not on file  . Number of children: Not on file  . Years of education: Not on file  . Highest education level: Not on file  Occupational History  . Not on file  Social Needs  . Financial resource strain: Not on file  . Food insecurity:    Worry: Not on file    Inability: Not on file  . Transportation needs:    Medical: Not on file    Non-medical: Not on file  Tobacco Use  . Smoking status: Former Smoker    Packs/day: 2.00    Years: 30.00    Pack years: 60.00    Types: Cigarettes    Start date: 03/28/1953    Last attempt to quit: 03/29/1983    Years since quitting: 34.6  . Smokeless tobacco: Never Used  Substance and Sexual Activity  . Alcohol use: Not Currently    Comment: 05/18/2017 'I drink a beer q yr"  . Drug use: No  . Sexual activity: Not on file  Lifestyle  . Physical activity:    Days per week: Not on file    Minutes per session: Not on file  . Stress: Not on file  Relationships  . Social connections:    Talks on phone: Not on file    Gets together: Not on file    Attends religious service: Not on file    Active member of club or organization: Not on file    Attends meetings of clubs or organizations: Not on file    Relationship status: Not on file  .  Intimate partner violence:    Fear of current or ex partner: Not on file    Emotionally abused: Not on file    Physically abused: Not on file    Forced sexual activity: Not on file  Other Topics Concern  . Not on file  Social History Narrative  . Not on file    REVIEW OF SYSTEMS: Constitutional: No new weakness or fatigue. ENT:  No nose bleeds Pulm: Clear sputum.  No profound dyspnea on exertion. CV:  No palpitations, no LE edema.  Chest pain as per HPI. GU:  No hematuria, no frequency GI:  Per HPI Heme: Patient denies any unusual bleeding or bruising. Transfusions:  Per HPI.  Denies knowledge of anemia prior to June/2019.  Has not been taking any iron. Neuro:  No headaches, no peripheral tingling or numbness Derm:  No itching, no rash or sores.  Endocrine:  No sweats or chills.  No polyuria or dysuria Immunization: Not queried Travel:  None beyond local counties in last few months.    PHYSICAL EXAM: Vital signs in last 24 hours: Vitals:   11/18/17 0749 11/18/17 1106  BP:  127/66  Pulse:  69  Resp:  (!) 21  Temp:  98 F (36.7 C)  SpO2: 97% 99%   Wt Readings from Last 3 Encounters:  11/18/17 96.7 kg  11/17/17 98 kg  11/13/17 96.6 kg    General: Pleasant, elderly WM.  Looks well.  He is comfortable. Head: No facial asymmetry or swelling.  No signs of head trauma. Eyes: No scleral icterus or conjunctival pallor.  EOMI. Ears: Not hard of hearing Nose: No congestion or discharge. Mouth: Dentures in place.  Oropharynx moist, pink, clear.  Tongue midline. Neck: No JVD, masses, thyromegaly, bruits. Lungs: Clear lungs bilaterally.  No dyspnea.  No cough Heart: Irregular irregular.  No MRG.  S1, S2 present. Abdomen: Soft.  Nondistended.  Active bowel sounds.  Mild tenderness in the left upper quadrant without guarding or rebound.  No HSM, bruits, hernias..   Rectal: Deferred.  Stool tested FOBT negative yesterday. Musc/Skeltl: No joint swelling, redness, gross  deformity. Extremities: No CCE.  S/P hallux amputations bilaterally, dressing not removed Neurologic: Alert.  Oriented x3.  Moves all 4 limbs, strength not tested.  No tremors. Skin: No rashes, sores, telangiectasia, suspicious lesions. Tattoos: None seen Nodes: No cervical or inguinal adenopathy. Psych: Cooperative, pleasant, calm.  Intake/Output from previous day: 08/23 0701 - 08/24 0700 In: -  Out: 500 [Urine:500] Intake/Output this shift: Total I/O In: 180 [P.O.:180] Out: 1200 [Urine:1200]  LAB RESULTS: Recent Labs    11/17/17 1548 11/17/17 1703 11/18/17 0421  WBC 7.9  --  6.0  HGB 8.1* 8.5* 7.5*  HCT 26.6* 25.0* 24.9*  PLT 226  --  218   BMET Lab Results  Component Value Date   NA 137 11/18/2017   NA 139 11/17/2017   NA 138 09/25/2017   K 4.2 11/18/2017   K 4.5 11/17/2017   K 4.1 09/25/2017   CL 106 11/18/2017   CL 104 11/17/2017   CL 101 09/25/2017   CO2 24 11/18/2017   CO2 27 09/25/2017   CO2 27 09/24/2017   GLUCOSE 134 (H) 11/18/2017   GLUCOSE 106 (H) 11/17/2017   GLUCOSE 170 (H) 09/25/2017   BUN 19 11/18/2017   BUN 18 11/17/2017   BUN 15 09/25/2017   CREATININE 1.18 11/18/2017   CREATININE 1.10 11/17/2017   CREATININE 0.97 09/25/2017   CALCIUM 8.5 (L) 11/18/2017   CALCIUM 8.9 09/25/2017   CALCIUM 8.4 (L) 09/24/2017   LFT No results for input(s): PROT, ALBUMIN, AST, ALT, ALKPHOS, BILITOT, BILIDIR, IBILI in the last 72 hours. PT/INR Lab Results  Component Value Date   INR 1.20 09/20/2017   INR 1.54 09/18/2017   INR 1.32 09/15/2017   Hepatitis Panel No results for input(s): HEPBSAG, HCVAB, HEPAIGM, HEPBIGM in the last 72 hours. C-Diff No components found for: CDIFF Lipase  No results found for: LIPASE  Drugs of Abuse  No results found for: LABOPIA, COCAINSCRNUR, LABBENZ, AMPHETMU, THCU, LABBARB   RADIOLOGY STUDIES: Dg Chest 2 View  Result Date: 11/17/2017 CLINICAL DATA:  Shortness of breath.  Chest pains.  Cough. EXAM: CHEST - 2 VIEW  COMPARISON:  09/04/2017. FINDINGS: Mediastinum and hilar structures normal. Mild bibasilar atelectasis/infiltrates. No pleural effusion or pneumothorax. Stable mild cardiomegaly. No acute bony abnormality. IMPRESSION: Mild bibasilar atelectasis/infiltrates. Electronically Signed   By:  [place]   On: 11/17/2017 16:30     IMPRESSION:   *    acut on hronic anemia.  Is about to receive 2 units of blood, received 1 unit after toe amputations and lower extremity angioplasty in June 2019. Previous unremarkable colonoscopy (diverticulosis) but that was 2004. Unremarkable EGD in 2016 Has had a months worth of postprandial pain and nausea with early satiety, progressive weight loss.  Given his CAD and PAD, intestinal ischemia is a possibility for cause of his nausea and pain.  However it would not explain his anemia.  Daily PPI so peptic ulcer disease less likely.   Rule out neoplasia, rule out AVMs. Reported dark but not melenic stool, FOBT negative yesterday.  Possibly could have GI bleed.  *   Chronic Plavix and Eliquis for history of CAD, PAD, cardiac stents, right lower extremity angioplasty, A. fib.    PLAN:     *  ? EGD/Colonsocopy.  Ideally should come off Plavix for procedures, this would need to be approved by cardiology.  Eliquis is currently on temporary hold.   Azucena Freed  11/18/2017, 12:02 PM Phone [phone]

## 2017-11-18 NOTE — Progress Notes (Addendum)
Progress Note  Patient Name: Mike Wright Date of Encounter: 11/18/2017  Primary Cardiologist:  Carlyle Dolly, MD  Subjective   Has had CP x 1 week or so, 5/10 now. Cannot remember when it was last 0/10. Stool yesterday was darker than usual, no other black/tarry stools. Has been getting nauseated after he eats, no vomiting. Is in Afib all the time. +SOB, more DOE than usual.  Inpatient Medications    Scheduled Meds: . amLODipine  10 mg Oral Daily  . aspirin EC  81 mg Oral Daily  . atorvastatin  80 mg Oral QPM  . clopidogrel  75 mg Oral Daily  . collagenase  1 application Topical Daily  . finasteride  5 mg Oral Daily  . insulin aspart  0-15 Units Subcutaneous TID WC  . insulin glargine  20 Units Subcutaneous BID  . metoprolol succinate  100 mg Oral Daily  . pantoprazole  40 mg Oral Daily  . psyllium  1 packet Oral Daily  . sulfamethoxazole-trimethoprim  1 tablet Oral BID  . tiotropium  18 mcg Inhalation Daily  . traZODone  100 mg Oral QHS   Continuous Infusions: . nitroGLYCERIN     PRN Meds: acetaminophen, ipratropium-albuterol, nitroGLYCERIN, ondansetron (ZOFRAN) IV   Vital Signs    Vitals:   11/18/17 0026 11/18/17 0543 11/18/17 0733 11/18/17 0749  BP: 117/69 129/74 118/67   Pulse: 73 69 73   Resp: (!) 24 18 14    Temp: 98.1 F (36.7 C) 98.1 F (36.7 C) 98.3 F (36.8 C)   TempSrc: Oral Oral Oral   SpO2: 98% 99% 95% 97%  Weight:  96.7 kg    Height:        Intake/Output Summary (Last 24 hours) at 11/18/2017 0841 Last data filed at 11/17/2017 2312 Gross per 24 hour  Intake -  Output 500 ml  Net -500 ml   Filed Weights   11/17/17 1504 11/17/17 2123 11/18/17 0543  Weight: 98 kg 97.2 kg 96.7 kg    Telemetry    Atrial flutter vs coarse fib. - Personally Reviewed  ECG    08/23, Atrial flutter w/ 3:1 conduction, with no acute ischemic changes.   - Personally Reviewed  Physical Exam   General: Well developed, well nourished, male appearing in no  acute distress. Head: Normocephalic, atraumatic.  Neck: Supple without bruits, JVD 11 cm. Lungs:  Resp regular and unlabored, rales bases. Heart: Irreg R&R, S1, S2, no S3, S4, 2-3/6 murmur; no rub. Abdomen: Soft, tender lower abdomen, non-distended with normoactive bowel sounds. No hepatomegaly. No rebound/guarding. No obvious abdominal masses. Extremities: No clubbing, cyanosis, no edema. Distal pedal pulses are 1-2+ bilaterally. Neuro: Alert and oriented X 3. Moves all extremities spontaneously. Psych: Normal affect.  Labs    Hematology Recent Labs  Lab 11/17/17 1548 11/17/17 1703 11/18/17 0421  WBC 7.9  --  6.0  RBC 3.41*  --  3.14*  HGB 8.1* 8.5* 7.5*  HCT 26.6* 25.0* 24.9*  MCV 78.0  --  79.3  MCH 23.8*  --  23.9*  MCHC 30.5  --  30.1  RDW 17.4*  --  17.2*  PLT 226  --  218    Chemistry Recent Labs  Lab 11/17/17 1703 11/18/17 0421  NA 139 137  K 4.5 4.2  CL 104 106  CO2  --  24  GLUCOSE 106* 134*  BUN 18 19  CREATININE 1.10 1.18  CALCIUM  --  8.5*  GFRNONAA  --  56*  GFRAA  --  >  76  ANIONGAP  --  7     Cardiac Enzymes Recent Labs  Lab 11/17/17 2144 11/18/17 0421  TROPONINI <0.03 <0.03    Recent Labs  Lab 11/17/17 1701  TROPIPOC 0.00     BNP Recent Labs  Lab 11/17/17 1548  BNP 192.0*     DDimer  Recent Labs  Lab 11/17/17 1548  DDIMER 0.48     Radiology    Dg Chest 2 View  Result Date: 11/17/2017 CLINICAL DATA:  Shortness of breath.  Chest pains.  Cough. EXAM: CHEST - 2 VIEW COMPARISON:  09/04/2017. FINDINGS: Mediastinum and hilar structures normal. Mild bibasilar atelectasis/infiltrates. No pleural effusion or pneumothorax. Stable mild cardiomegaly. No acute bony abnormality. IMPRESSION: Mild bibasilar atelectasis/infiltrates. Electronically Signed   By: Marcello Moores  Register   On: 11/17/2017 16:30     Cardiac Studies   ECHO:  09/15/2017 - Left ventricle: The cavity size was normal. Wall thickness was   increased in a pattern of  moderate LVH. Systolic function was   normal. The estimated ejection fraction was in the range of 55%   to 60%. Wall motion was normal; there were no regional wall   motion abnormalities. - Aortic valve: Trileaflet; moderately thickened, moderately   calcified leaflets. Valve mobility was restricted. There was mild   stenosis. There was mild regurgitation. Peak velocity (S): 277   cm/s. Mean gradient (S): 16 mm Hg. Valve area (VTI): 2.01 cm^2.   Valve area (Vmax): 1.8 cm^2. Valve area (Vmean): 1.73 cm^2. - Mitral valve: There was mild regurgitation. Valve area by   pressure half-time: 0.93 cm^2. - Pulmonary arteries: Systolic pressure was mildly increased. PA   peak pressure: 44 mm Hg (S).  CATH: 05/18/2017 Post-Intervention Diagram        Patient Profile     84 y.o. male w/ hx prior PCI, PAD, AF, HFpEF, HTN, DM, HLD, who was admitted 08/23 with chest pain. Eliquis held.   Assessment & Plan     Principal Problem: 1.  Chest pain  - sx have been continuous x 1 week w/out elevated ez or ECG changes - suspect GI cause of sx - will try GI cocktail - discuss GI consult w/ MD - W/ possible GI issues, keep NPO for now  Active Problems: 2.  Acute on chronic anemia - unclear cause, possible GI origin - discuss transfusion w/ MD +/- GI eval  3.  Volume overload - CXR w/ mild basilar infiltrate, ATX - however, pt SOB w/ conversation, on O2 and has JVD - will give 1 dose Lasix 40 mg and follow - anemia may also cause DOE   Jonetta Speak , PA-C 8:41 AM 11/18/2017 Pager: [phone]  Cardiology Attending  Patient seen and examined. Agree with the findings as noted above. The patient has been stable overnight. His sob and chest pressure are improved. He has had a progressive drop in his H/H. We will transfuse and ask GI to see for endoscopy/colonoscopy. He notes that he has not had a colonoscopy in over 10 years. I suspect his symptoms are due to supply demand  mismatch.   Mikle Bosworth.D.

## 2017-11-19 DIAGNOSIS — E039 Hypothyroidism, unspecified: Secondary | ICD-10-CM | POA: Diagnosis not present

## 2017-11-19 DIAGNOSIS — I480 Paroxysmal atrial fibrillation: Secondary | ICD-10-CM | POA: Diagnosis not present

## 2017-11-19 DIAGNOSIS — Z7902 Long term (current) use of antithrombotics/antiplatelets: Secondary | ICD-10-CM | POA: Diagnosis not present

## 2017-11-19 DIAGNOSIS — R1084 Generalized abdominal pain: Secondary | ICD-10-CM | POA: Diagnosis not present

## 2017-11-19 DIAGNOSIS — D508 Other iron deficiency anemias: Secondary | ICD-10-CM

## 2017-11-19 DIAGNOSIS — R110 Nausea: Secondary | ICD-10-CM | POA: Diagnosis not present

## 2017-11-19 DIAGNOSIS — D649 Anemia, unspecified: Secondary | ICD-10-CM

## 2017-11-19 DIAGNOSIS — Z7901 Long term (current) use of anticoagulants: Secondary | ICD-10-CM | POA: Diagnosis not present

## 2017-11-19 DIAGNOSIS — R072 Precordial pain: Secondary | ICD-10-CM | POA: Diagnosis not present

## 2017-11-19 DIAGNOSIS — I5032 Chronic diastolic (congestive) heart failure: Secondary | ICD-10-CM | POA: Diagnosis not present

## 2017-11-19 DIAGNOSIS — I25119 Atherosclerotic heart disease of native coronary artery with unspecified angina pectoris: Secondary | ICD-10-CM | POA: Diagnosis not present

## 2017-11-19 DIAGNOSIS — I110 Hypertensive heart disease with heart failure: Secondary | ICD-10-CM | POA: Diagnosis not present

## 2017-11-19 DIAGNOSIS — E782 Mixed hyperlipidemia: Secondary | ICD-10-CM | POA: Diagnosis not present

## 2017-11-19 DIAGNOSIS — D500 Iron deficiency anemia secondary to blood loss (chronic): Secondary | ICD-10-CM | POA: Diagnosis not present

## 2017-11-19 LAB — HEPATIC FUNCTION PANEL
ALBUMIN: 3.1 g/dL — AB (ref 3.5–5.0)
ALT: 12 U/L (ref 0–44)
AST: 18 U/L (ref 15–41)
Alkaline Phosphatase: 73 U/L (ref 38–126)
BILIRUBIN INDIRECT: 0.9 mg/dL (ref 0.3–0.9)
Bilirubin, Direct: 0.3 mg/dL — ABNORMAL HIGH (ref 0.0–0.2)
Total Bilirubin: 1.2 mg/dL (ref 0.3–1.2)
Total Protein: 6.3 g/dL — ABNORMAL LOW (ref 6.5–8.1)

## 2017-11-19 LAB — GLUCOSE, CAPILLARY
GLUCOSE-CAPILLARY: 152 mg/dL — AB (ref 70–99)
Glucose-Capillary: 85 mg/dL (ref 70–99)
Glucose-Capillary: 158 mg/dL — ABNORMAL HIGH (ref 70–99)
Glucose-Capillary: 142 mg/dL — ABNORMAL HIGH (ref 70–99)

## 2017-11-19 LAB — BPAM RBC
BLOOD PRODUCT EXPIRATION DATE: 201909202359
Blood Product Expiration Date: 201909202359
ISSUE DATE / TIME: 201908241330
ISSUE DATE / TIME: 201908241831
UNIT TYPE AND RH: 5100
Unit Type and Rh: 5100

## 2017-11-19 LAB — TYPE AND SCREEN
ABO/RH(D): O POS
Antibody Screen: NEGATIVE
UNIT DIVISION: 0
Unit division: 0

## 2017-11-19 LAB — CBC
HCT: 33 % — ABNORMAL LOW (ref 39.0–52.0)
Hemoglobin: 10.2 g/dL — ABNORMAL LOW (ref 13.0–17.0)
MCH: 24.6 pg — AB (ref 26.0–34.0)
MCHC: 30.9 g/dL (ref 30.0–36.0)
MCV: 79.5 fL (ref 78.0–100.0)
PLATELETS: 218 10*3/uL (ref 150–400)
RBC: 4.15 MIL/uL — ABNORMAL LOW (ref 4.22–5.81)
RDW: 17.2 % — AB (ref 11.5–15.5)
WBC: 5.6 10*3/uL (ref 4.0–10.5)

## 2017-11-19 LAB — BASIC METABOLIC PANEL
Anion gap: 12 (ref 5–15)
BUN: 14 mg/dL (ref 8–23)
CHLORIDE: 102 mmol/L (ref 98–111)
CO2: 24 mmol/L (ref 22–32)
CREATININE: 1.12 mg/dL (ref 0.61–1.24)
Calcium: 8.7 mg/dL — ABNORMAL LOW (ref 8.9–10.3)
GFR calc Af Amer: >60 mL/min (ref >60)
GFR calc non Af Amer: 59 mL/min — ABNORMAL LOW (ref >60)
GLUCOSE: 83 mg/dL (ref 70–99)
Potassium: 3.7 mmol/L (ref 3.5–5.1)
Sodium: 138 mmol/L (ref 135–145)

## 2017-11-19 LAB — LIPASE, BLOOD: LIPASE: 32 U/L (ref 11–51)

## 2017-11-19 NOTE — Progress Notes (Signed)
Progress Note  Patient Name: Mike Wright Date of Encounter: 11/19/2017  Primary Cardiologist: Carlyle Dolly, MD   Subjective   No chest pain or sob. Notes some nausea with eating.   Inpatient Medications    Scheduled Meds: . amLODipine  10 mg Oral Daily  . atorvastatin  80 mg Oral QPM  . clopidogrel  75 mg Oral Daily  . collagenase  1 application Topical Daily  . finasteride  5 mg Oral Daily  . furosemide  20 mg Intravenous Once  . insulin aspart  0-15 Units Subcutaneous TID WC  . insulin glargine  20 Units Subcutaneous BID  . metoprolol succinate  100 mg Oral Daily  . pantoprazole  40 mg Oral Daily  . psyllium  1 packet Oral Daily  . sulfamethoxazole-trimethoprim  1 tablet Oral BID  . tiotropium  18 mcg Inhalation Daily  . traZODone  100 mg Oral QHS   Continuous Infusions: . nitroGLYCERIN     PRN Meds: acetaminophen, gi cocktail, ipratropium-albuterol, nitroGLYCERIN, ondansetron (ZOFRAN) IV   Vital Signs    Vitals:   11/19/17 0756 11/19/17 0831 11/19/17 0843 11/19/17 0957  BP: 129/76   (!) 118/98  Pulse: 60   72  Resp: (!) 22     Temp: 98.3 F (36.8 C)     TempSrc: Oral     SpO2: 94% 95% 94%   Weight:      Height:        Intake/Output Summary (Last 24 hours) at 11/19/2017 1024 Last data filed at 11/19/2017 0500 Gross per 24 hour  Intake 2618 ml  Output 2875 ml  Net -257 ml   Filed Weights   11/17/17 2123 11/18/17 0543 11/19/17 0500  Weight: 97.2 kg 96.7 kg 89.1 kg    Telemetry    nsr - Personally Reviewed  ECG    none - Personally Reviewed  Physical Exam   GEN: No acute distress.   Neck: No JVD Cardiac: RRR, no murmurs, rubs, or gallops.  Respiratory: Clear to auscultation bilaterally. GI: Soft, nontender, non-distended  MS: No edema; No deformity. Neuro:  Nonfocal  Psych: Normal affect   Labs    Chemistry Recent Labs  Lab 11/17/17 1703 11/18/17 0421 11/19/17 0524  NA 139 137 138  K 4.5 4.2 3.7  CL 104 106 102  CO2   --  24 24  GLUCOSE 106* 134* 83  BUN 18 19 14   CREATININE 1.10 1.18 1.12  CALCIUM  --  8.5* 8.7*  PROT  --   --  6.3*  ALBUMIN  --   --  3.1*  AST  --   --  18  ALT  --   --  12  ALKPHOS  --   --  73  BILITOT  --   --  1.2  GFRNONAA  --  56* 59*  GFRAA  --  >60 >60  ANIONGAP  --  7 12     Hematology Recent Labs  Lab 11/17/17 1548 11/17/17 1703 11/18/17 0421 11/19/17 0524  WBC 7.9  --  6.0 5.6  RBC 3.41*  --  3.14* 4.15*  HGB 8.1* 8.5* 7.5* 10.2*  HCT 26.6* 25.0* 24.9* 33.0*  MCV 78.0  --  79.3 79.5  MCH 23.8*  --  23.9* 24.6*  MCHC 30.5  --  30.1 30.9  RDW 17.4*  --  17.2* 17.2*  PLT 226  --  218 218    Cardiac Enzymes Recent Labs  Lab 11/17/17 2144 11/18/17  0421 11/18/17 1026  TROPONINI <0.03 <0.03 <0.03    Recent Labs  Lab 11/17/17 1701  TROPIPOC 0.00     BNP Recent Labs  Lab 11/17/17 1548  BNP 192.0*     DDimer  Recent Labs  Lab 11/17/17 1548  DDIMER 0.48     Radiology    Dg Chest 2 View  Result Date: 11/17/2017 CLINICAL DATA:  Shortness of breath.  Chest pains.  Cough. EXAM: CHEST - 2 VIEW COMPARISON:  09/04/2017. FINDINGS: Mediastinum and hilar structures normal. Mild bibasilar atelectasis/infiltrates. No pleural effusion or pneumothorax. Stable mild cardiomegaly. No acute bony abnormality. IMPRESSION: Mild bibasilar atelectasis/infiltrates. Electronically Signed   By: Marcello Moores  Register   On: 11/17/2017 16:30    Cardiac Studies   none  Patient Profile     84 y.o. male admitted with chest pain, enzymes negative, worrisome for a GI source. Noted to have anemia and some volume overload, now with nausea and abdominal pain with eating.  Assessment & Plan    1. Chest pain - enzymes negative. No additional cardiac workup is planned at this point.  2. Anemia - he is s/p transfusion. 3. Nausea and abdominal pain - appreciate GI service consult. Note plans for EGD and additional lab eval. Could he have pancreatitis?  For questions or  updates, please contact [place] Please consult [email or web address] for contact info under Cardiology/STEMI.      Signed, Cristopher Peru, MD  11/19/2017, 10:24 AM  Patient ID: Mike Wright, male   DOB: 07-24-1933, 84 y.o.   MRN: [id number]

## 2017-11-19 NOTE — H&P (View-Only) (Signed)
Daily Rounding Note  11/19/2017, 8:10 AM  LOS: 2 days   SUBJECTIVE:   havng the abd pai and nausea with clears, same as with solid food.  No BM yet.  Normally takes metamucil daily      OBJECTIVE:         Vital signs in last 24 hours:    Temp:  [97.4 F (36.3 C)-98.7 F (37.1 C)] 98.3 F (36.8 C) (08/25 0756) Pulse Rate:  [57-109] 60 (08/25 0756) Resp:  [18-24] 22 (08/25 0756) BP: (109-156)/(62-85) 129/76 (08/25 0756) SpO2:  [92 %-100 %] 94 % (08/25 0756) Weight:  [89.1 kg] 89.1 kg (08/25 0500) Last BM Date: 11/17/17 Filed Weights   11/17/17 2123 11/18/17 0543 11/19/17 0500  Weight: 97.2 kg 96.7 kg 89.1 kg   General: looks comfortable and well   Heart: RRR Chest: clear bil.  No cough or SOB Abdomen: soft, mil tenderness in upper and mid abdomen.  Active BS.  No bruits  Extremities: no CCE Neuro/Psych:  Oriented x 3.  Fully alert.  No gross weakness, tremor.    Intake/Output from previous day: 08/24 0701 - 08/25 0700 In: 2798 [P.O.:1700; I.V.:50; Blood:740] Out: 3500 [Urine:3500]  Intake/Output this shift: No intake/output data recorded.  Lab Results: Recent Labs    11/17/17 1548 11/17/17 1703 11/18/17 0421 11/19/17 0524  WBC 7.9  --  6.0 5.6  HGB 8.1* 8.5* 7.5* 10.2*  HCT 26.6* 25.0* 24.9* 33.0*  PLT 226  --  218 218   BMET Recent Labs    11/17/17 1703 11/18/17 0421 11/19/17 0524  NA 139 137 138  K 4.5 4.2 3.7  CL 104 106 102  CO2  --  24 24  GLUCOSE 106* 134* 83  BUN 18 19 14   CREATININE 1.10 1.18 1.12  CALCIUM  --  8.5* 8.7*   LFT No results for input(s): PROT, ALBUMIN, AST, ALT, ALKPHOS, BILITOT, BILIDIR, IBILI in the last 72 hours. PT/INR No results for input(s): LABPROT, INR in the last 72 hours. Hepatitis Panel No results for input(s): HEPBSAG, HCVAB, HEPAIGM, HEPBIGM in the last 72 hours.  Studies/Results: Dg Chest 2 View  Result Date: 11/17/2017 CLINICAL DATA:  Shortness  of breath.  Chest pains.  Cough. EXAM: CHEST - 2 VIEW COMPARISON:  09/04/2017. FINDINGS: Mediastinum and hilar structures normal. Mild bibasilar atelectasis/infiltrates. No pleural effusion or pneumothorax. Stable mild cardiomegaly. No acute bony abnormality. IMPRESSION: Mild bibasilar atelectasis/infiltrates. Electronically Signed   By: [place]   On: 11/17/2017 16:30   Scheduled Meds: . amLODipine  10 mg Oral Daily  . atorvastatin  80 mg Oral QPM  . clopidogrel  75 mg Oral Daily  . collagenase  1 application Topical Daily  . finasteride  5 mg Oral Daily  . furosemide  20 mg Intravenous Once  . insulin aspart  0-15 Units Subcutaneous TID WC  . insulin glargine  20 Units Subcutaneous BID  . metoprolol succinate  100 mg Oral Daily  . pantoprazole  40 mg Oral Daily  . psyllium  1 packet Oral Daily  . sulfamethoxazole-trimethoprim  1 tablet Oral BID  . tiotropium  18 mcg Inhalation Daily  . traZODone  100 mg Oral QHS   Continuous Infusions: . nitroGLYCERIN     PRN Meds:.acetaminophen, gi cocktail, ipratropium-albuterol, nitroGLYCERIN, ondansetron (ZOFRAN) IV   ASSESMENT:   *  Acute on chronic anemia.  Good response to 2 U PRBCs 7.5 >> 8.2, previous transfusion late  08/2017.  Suspect UGI source though single FOBT was negative, pt reports dark but not black, tarry, bloody stool.     *   Post prandial abd pain and nausea.  Daily PPI at home so ulcers less likely.  ? Mesenteric ischemia. ? Diabetic gastroparesis.  ? Biliary .  Has not had abd/pelvic ultrasound, CT, xrays.       *   CAD, ASPVD.  RLE angioplasty 08/2017, cardiac stents 03/2017.  Plavix not on hold  *   A fib.  Eliquis on hold    PLAN   *  Anemia panel.  CBC in AM.  EGD tomorrow.  Add on hepatic fx, lipase profile to labs May need to pursue a CT scan if EGD negative.    *  Allow solid food today, NPO after midnight.        Mike Wright  11/19/2017, 8:10 AM Phone [phone]

## 2017-11-19 NOTE — Progress Notes (Addendum)
Daily Rounding Note  11/19/2017, 8:10 AM  LOS: 2 days   SUBJECTIVE:   havng the abd pai and nausea with clears, same as with solid food.  No BM yet.  Normally takes metamucil daily      OBJECTIVE:         Vital signs in last 24 hours:    Temp:  [97.4 F (36.3 C)-98.7 F (37.1 C)] 98.3 F (36.8 C) (08/25 0756) Pulse Rate:  [57-109] 60 (08/25 0756) Resp:  [18-24] 22 (08/25 0756) BP: (109-156)/(62-85) 129/76 (08/25 0756) SpO2:  [92 %-100 %] 94 % (08/25 0756) Weight:  [89.1 kg] 89.1 kg (08/25 0500) Last BM Date: 11/17/17 Filed Weights   11/17/17 2123 11/18/17 0543 11/19/17 0500  Weight: 97.2 kg 96.7 kg 89.1 kg   General: looks comfortable and well   Heart: RRR Chest: clear bil.  No cough or SOB Abdomen: soft, mil tenderness in upper and mid abdomen.  Active BS.  No bruits  Extremities: no CCE Neuro/Psych:  Oriented x 3.  Fully alert.  No gross weakness, tremor.    Intake/Output from previous day: 08/24 0701 - 08/25 0700 In: 2798 [P.O.:1700; I.V.:50; Blood:740] Out: 3500 [Urine:3500]  Intake/Output this shift: No intake/output data recorded.  Lab Results: Recent Labs    11/17/17 1548 11/17/17 1703 11/18/17 0421 11/19/17 0524  WBC 7.9  --  6.0 5.6  HGB 8.1* 8.5* 7.5* 10.2*  HCT 26.6* 25.0* 24.9* 33.0*  PLT 226  --  218 218   BMET Recent Labs    11/17/17 1703 11/18/17 0421 11/19/17 0524  NA 139 137 138  K 4.5 4.2 3.7  CL 104 106 102  CO2  --  24 24  GLUCOSE 106* 134* 83  BUN 18 19 14   CREATININE 1.10 1.18 1.12  CALCIUM  --  8.5* 8.7*   LFT No results for input(s): PROT, ALBUMIN, AST, ALT, ALKPHOS, BILITOT, BILIDIR, IBILI in the last 72 hours. PT/INR No results for input(s): LABPROT, INR in the last 72 hours. Hepatitis Panel No results for input(s): HEPBSAG, HCVAB, HEPAIGM, HEPBIGM in the last 72 hours.  Studies/Results: Dg Chest 2 View  Result Date: 11/17/2017 CLINICAL DATA:  Shortness  of breath.  Chest pains.  Cough. EXAM: CHEST - 2 VIEW COMPARISON:  09/04/2017. FINDINGS: Mediastinum and hilar structures normal. Mild bibasilar atelectasis/infiltrates. No pleural effusion or pneumothorax. Stable mild cardiomegaly. No acute bony abnormality. IMPRESSION: Mild bibasilar atelectasis/infiltrates. Electronically Signed   By: [place]   On: 11/17/2017 16:30   Scheduled Meds: . amLODipine  10 mg Oral Daily  . atorvastatin  80 mg Oral QPM  . clopidogrel  75 mg Oral Daily  . collagenase  1 application Topical Daily  . finasteride  5 mg Oral Daily  . furosemide  20 mg Intravenous Once  . insulin aspart  0-15 Units Subcutaneous TID WC  . insulin glargine  20 Units Subcutaneous BID  . metoprolol succinate  100 mg Oral Daily  . pantoprazole  40 mg Oral Daily  . psyllium  1 packet Oral Daily  . sulfamethoxazole-trimethoprim  1 tablet Oral BID  . tiotropium  18 mcg Inhalation Daily  . traZODone  100 mg Oral QHS   Continuous Infusions: . nitroGLYCERIN     PRN Meds:.acetaminophen, gi cocktail, ipratropium-albuterol, nitroGLYCERIN, ondansetron (ZOFRAN) IV   ASSESMENT:   *  Acute on chronic anemia.  Good response to 2 U PRBCs 7.5 >> 8.2, previous transfusion late  08/2017.  Suspect UGI source though single FOBT was negative, pt reports dark but not black, tarry, bloody stool.     *   Post prandial abd pain and nausea.  Daily PPI at home so ulcers less likely.  ? Mesenteric ischemia. ? Diabetic gastroparesis.  ? Biliary .  Has not had abd/pelvic ultrasound, CT, xrays.       *   CAD, ASPVD.  RLE angioplasty 08/2017, cardiac stents 03/2017.  Plavix not on hold  *   A fib.  Eliquis on hold    PLAN   *  Anemia panel.  CBC in AM.  EGD tomorrow.  Add on hepatic fx, lipase profile to labs May need to pursue a CT scan if EGD negative.    *  Allow solid food today, NPO after midnight.        Mike Wright  11/19/2017, 8:10 AM Phone [phone]

## 2017-11-20 ENCOUNTER — Encounter (HOSPITAL_COMMUNITY)
Admission: EM | Disposition: A | Discharge status: Home Health | Payer: Self-pay | Source: Home / Self Care | Attending: Cardiovascular Disease

## 2017-11-20 ENCOUNTER — Encounter (HOSPITAL_COMMUNITY): Payer: Self-pay | Admitting: Certified Registered Nurse Anesthetist

## 2017-11-20 ENCOUNTER — Inpatient Hospital Stay (HOSPITAL_COMMUNITY): Payer: Medicare Other | Admitting: Certified Registered Nurse Anesthetist

## 2017-11-20 DIAGNOSIS — D500 Iron deficiency anemia secondary to blood loss (chronic): Principal | ICD-10-CM

## 2017-11-20 DIAGNOSIS — R110 Nausea: Secondary | ICD-10-CM | POA: Diagnosis present

## 2017-11-20 DIAGNOSIS — R1013 Epigastric pain: Secondary | ICD-10-CM | POA: Diagnosis not present

## 2017-11-20 DIAGNOSIS — E8779 Other fluid overload: Secondary | ICD-10-CM

## 2017-11-20 DIAGNOSIS — R6881 Early satiety: Secondary | ICD-10-CM | POA: Diagnosis not present

## 2017-11-20 DIAGNOSIS — I208 Other forms of angina pectoris: Secondary | ICD-10-CM

## 2017-11-20 DIAGNOSIS — I5032 Chronic diastolic (congestive) heart failure: Secondary | ICD-10-CM | POA: Diagnosis not present

## 2017-11-20 DIAGNOSIS — R1030 Lower abdominal pain, unspecified: Secondary | ICD-10-CM | POA: Diagnosis present

## 2017-11-20 DIAGNOSIS — I10 Essential (primary) hypertension: Secondary | ICD-10-CM | POA: Diagnosis not present

## 2017-11-20 DIAGNOSIS — I25119 Atherosclerotic heart disease of native coronary artery with unspecified angina pectoris: Secondary | ICD-10-CM

## 2017-11-20 DIAGNOSIS — E782 Mixed hyperlipidemia: Secondary | ICD-10-CM | POA: Diagnosis not present

## 2017-11-20 DIAGNOSIS — R109 Unspecified abdominal pain: Secondary | ICD-10-CM

## 2017-11-20 DIAGNOSIS — D649 Anemia, unspecified: Secondary | ICD-10-CM | POA: Diagnosis not present

## 2017-11-20 DIAGNOSIS — Z7902 Long term (current) use of antithrombotics/antiplatelets: Secondary | ICD-10-CM | POA: Diagnosis not present

## 2017-11-20 DIAGNOSIS — K9189 Other postprocedural complications and disorders of digestive system: Secondary | ICD-10-CM | POA: Diagnosis not present

## 2017-11-20 DIAGNOSIS — I2510 Atherosclerotic heart disease of native coronary artery without angina pectoris: Secondary | ICD-10-CM | POA: Diagnosis present

## 2017-11-20 DIAGNOSIS — Z9861 Coronary angioplasty status: Secondary | ICD-10-CM

## 2017-11-20 DIAGNOSIS — I480 Paroxysmal atrial fibrillation: Secondary | ICD-10-CM

## 2017-11-20 DIAGNOSIS — Z7901 Long term (current) use of anticoagulants: Secondary | ICD-10-CM | POA: Diagnosis not present

## 2017-11-20 DIAGNOSIS — E039 Hypothyroidism, unspecified: Secondary | ICD-10-CM | POA: Diagnosis not present

## 2017-11-20 DIAGNOSIS — I110 Hypertensive heart disease with heart failure: Secondary | ICD-10-CM | POA: Diagnosis not present

## 2017-11-20 HISTORY — PX: ESOPHAGOGASTRODUODENOSCOPY (EGD) WITH PROPOFOL: SHX5813

## 2017-11-20 LAB — BASIC METABOLIC PANEL
Anion gap: 10 (ref 5–15)
BUN: 16 mg/dL (ref 8–23)
CHLORIDE: 101 mmol/L (ref 98–111)
CO2: 24 mmol/L (ref 22–32)
CREATININE: 1.17 mg/dL (ref 0.61–1.24)
Calcium: 8.7 mg/dL — ABNORMAL LOW (ref 8.9–10.3)
GFR calc non Af Amer: 56 mL/min — ABNORMAL LOW (ref >60)
Glucose, Bld: 126 mg/dL — ABNORMAL HIGH (ref 70–99)
POTASSIUM: 3.9 mmol/L (ref 3.5–5.1)
Sodium: 135 mmol/L (ref 135–145)

## 2017-11-20 LAB — RETICULOCYTES
RBC.: 4.08 MIL/uL — ABNORMAL LOW (ref 4.22–5.81)
RETIC CT PCT: 1.5 % (ref 0.4–3.1)
Retic Count, Absolute: 61.2 10*3/uL (ref 19.0–186.0)

## 2017-11-20 LAB — CBC
HEMATOCRIT: 32.6 % — AB (ref 39.0–52.0)
Hemoglobin: 10.2 g/dL — ABNORMAL LOW (ref 13.0–17.0)
MCH: 25 pg — AB (ref 26.0–34.0)
MCHC: 31.3 g/dL (ref 30.0–36.0)
MCV: 79.9 fL (ref 78.0–100.0)
PLATELETS: 256 10*3/uL (ref 150–400)
RBC: 4.08 MIL/uL — AB (ref 4.22–5.81)
RDW: 17.6 % — ABNORMAL HIGH (ref 11.5–15.5)
WBC: 5.9 10*3/uL (ref 4.0–10.5)

## 2017-11-20 LAB — FERRITIN: Ferritin: 37 ng/mL (ref 24–336)

## 2017-11-20 LAB — GLUCOSE, CAPILLARY
GLUCOSE-CAPILLARY: 106 mg/dL — AB (ref 70–99)
Glucose-Capillary: 132 mg/dL — ABNORMAL HIGH (ref 70–99)
Glucose-Capillary: 143 mg/dL — ABNORMAL HIGH (ref 70–99)
Glucose-Capillary: 142 mg/dL — ABNORMAL HIGH (ref 70–99)

## 2017-11-20 LAB — FOLATE: Folate: 6.4 ng/mL (ref >5.9)

## 2017-11-20 LAB — IRON AND TIBC
Iron: 19 ug/dL — ABNORMAL LOW (ref 45–182)
Saturation Ratios: 7 % — ABNORMAL LOW (ref 17.9–39.5)
TIBC: 272 ug/dL (ref 250–450)
UIBC: 253 ug/dL

## 2017-11-20 LAB — VITAMIN B12: Vitamin B-12: 1071 pg/mL — ABNORMAL HIGH (ref 180–914)

## 2017-11-20 SURGERY — ESOPHAGOGASTRODUODENOSCOPY (EGD) WITH PROPOFOL
Anesthesia: Monitor Anesthesia Care

## 2017-11-20 MED ORDER — LACTATED RINGERS IV SOLN
INTRAVENOUS | Status: AC | PRN
  Administered 2017-11-20: 1000 mL via INTRAVENOUS

## 2017-11-20 MED ORDER — PROPOFOL 500 MG/50ML IV EMUL
INTRAVENOUS | Status: DC | PRN
  Administered 2017-11-20: 100 ug/kg/min via INTRAVENOUS

## 2017-11-20 SURGICAL SUPPLY — 15 items

## 2017-11-20 NOTE — Progress Notes (Signed)
Progress Note  Patient Name: DERWOOD BECRAFT Date of Encounter: 11/20/2017  Primary Cardiologist: Carlyle Dolly, MD   Subjective   No more CP or Dyspnea.  No nausea No longer groggy post EGD   Inpatient Medications    Scheduled Meds: . amLODipine  10 mg Oral Daily  . atorvastatin  80 mg Oral QPM  . clopidogrel  75 mg Oral Daily  . collagenase  1 application Topical Daily  . finasteride  5 mg Oral Daily  . furosemide  20 mg Intravenous Once  . insulin aspart  0-15 Units Subcutaneous TID WC  . insulin glargine  20 Units Subcutaneous BID  . metoprolol succinate  100 mg Oral Daily  . pantoprazole  40 mg Oral Daily  . psyllium  1 packet Oral Daily  . sulfamethoxazole-trimethoprim  1 tablet Oral BID  . tiotropium  18 mcg Inhalation Daily  . traZODone  100 mg Oral QHS   Continuous Infusions: . nitroGLYCERIN     PRN Meds: acetaminophen, gi cocktail, ipratropium-albuterol, nitroGLYCERIN, ondansetron (ZOFRAN) IV   Vital Signs    Vitals:   11/20/17 1009 11/20/17 1019 11/20/17 1329 11/20/17 1345  BP: 93/60 135/66  127/77  Pulse: 67 64  63  Resp: (!) 22 (!) 24    Temp:    97.6 F (36.4 C)  TempSrc:    Oral  SpO2: 93% 98% 96% 94%  Weight:      Height:        Intake/Output Summary (Last 24 hours) at 11/20/2017 1539 Last data filed at 11/20/2017 1962 Gross per 24 hour  Intake 680 ml  Output 1050 ml  Net -370 ml   Filed Weights   11/19/17 0500 11/20/17 0300 11/20/17 0840  Weight: 89.1 kg 94.3 kg 88.9 kg    Telemetry    nsr - Personally Reviewed  ECG    none - Personally Reviewed  Physical Exam   Physical Exam  Constitutional: He is oriented to person, place, and time. He appears well-developed and well-nourished. No distress.  Healthy appearing  HENT:  Head: Normocephalic and atraumatic.  Eyes: Conjunctivae and EOM are normal.  Neck: JVD (~10 cm H20) present.  Cardiovascular: Normal rate, regular rhythm, normal heart sounds and intact distal pulses.  Exam reveals no gallop and no friction rub.  No murmur heard. Pulmonary/Chest: Effort normal and breath sounds normal. No respiratory distress. He has no wheezes. He has no rales.  Abdominal: Soft. Bowel sounds are normal. He exhibits no distension.  Musculoskeletal: Normal range of motion. He exhibits no edema.  Neurological: He is alert and oriented to person, place, and time.  Skin: Skin is warm and dry. No rash noted. No erythema.  Psychiatric: He has a normal mood and affect. His behavior is normal. Judgment and thought content normal.  Nursing note and vitals reviewed.     Labs    Chemistry Recent Labs  Lab 11/18/17 0421 11/19/17 0524 11/20/17 0438  NA 137 138 135  K 4.2 3.7 3.9  CL 106 102 101  CO2 24 24 24   GLUCOSE 134* 83 126*  BUN 19 14 16   CREATININE 1.18 1.12 1.17  CALCIUM 8.5* 8.7* 8.7*  PROT  --  6.3*  --   ALBUMIN  --  3.1*  --   AST  --  18  --   ALT  --  12  --   ALKPHOS  --  73  --   BILITOT  --  1.2  --  GFRNONAA 56* 59* 56*  GFRAA >60 >60 >60  ANIONGAP 7 12 10      Hematology Recent Labs  Lab 11/18/17 0421 11/19/17 0524 11/20/17 0438  WBC 6.0 5.6 5.9  RBC 3.14* 4.15* 4.08*  4.08*  HGB 7.5* 10.2* 10.2*  HCT 24.9* 33.0* 32.6*  MCV 79.3 79.5 79.9  MCH 23.9* 24.6* 25.0*  MCHC 30.1 30.9 31.3  RDW 17.2* 17.2* 17.6*  PLT 218 218 256    Cardiac Enzymes Recent Labs  Lab 11/17/17 2144 11/18/17 0421 11/18/17 1026  TROPONINI <0.03 <0.03 <0.03    Recent Labs  Lab 11/17/17 1701  TROPIPOC 0.00     BNP Recent Labs  Lab 11/17/17 1548  BNP 192.0*     DDimer  Recent Labs  Lab 11/17/17 1548  DDIMER 0.48     Radiology    No results found.  -GI procedure reviewed (prelim report)  Cardiac Studies   No new studies  Patient Profile     84 y.o. male admitted with chest pain, enzymes negative, worrisome for a GI source. Noted to have anemia and some volume overload, now with nausea and abdominal pain with eating. Notably anemic  on admission - transfused.  R/o MI - plans for ischemic evaluation aborted.   Assessment & Plan    Principal Problem:   Chest pain Active Problems:   Iron deficiency anemia due to chronic blood loss   CAD S/P percutaneous coronary angioplasty   Coronary artery disease involving native coronary artery of native heart with angina pectoris (HCC)   Essential hypertension   Chronic diastolic CHF (congestive heart failure) (HCC)   PAF (paroxysmal atrial fibrillation) (HCC)   Acute on chronic anemia   Volume overload   Abdominal pain   Nausea without vomiting   Chest pain - Negative CE - more likely c/w Anemia mediated ischemia.  Better s/p txfusion - no plan for additional CV work-up.   CAD-PCI with angina: on Plavix (no ASA b/c Eliquis for Afib) -   On max dose Amlodipine & high dose Toprol. - consider nitrate if additional angina relief desired.  On statin  Anemia (FE deficient) - he is s/p transfusion. Hgb stable -- will continue to hold Eliquis for now given recent PCI & need for Plavix (with 2 LAD stents, prefer to complete 1 yr Plavix, then d/c for Eliquis)..  Add PO Fe on d/c - need CBC check in ~2 wk prior to f/u  PAF - maintaining NSR; on Metoprolol.  Eliquis on hold. BP stable on CCB & BB  HFpEF (DHF with mild volume overload) - related to anemia :   IV Lasix ordered (reassess in AM).  Increase home PO dose to 20 mg.  Continue Toprol  Nausea and abdominal pain - appreciate GI service consult. -EGD done today => no notable findings.  NO evidence of elevated amylase/lipiase -- r/o pancreatitis. - Abd [place] ordered for AM by GI    For questions or updates, please contact [place] Please consult [email or web address] for contact info under Cardiology/STEMI.      Signed, Glenetta Hew, MD  11/20/2017, 3:39 PM  Patient ID: [name], male   DOB: Oct 14, 1933, 84 y.o.   MRN: [id number]

## 2017-11-20 NOTE — Anesthesia Postprocedure Evaluation (Signed)
Anesthesia Post Note  Patient: Mike Wright  Procedure(s) Performed: ESOPHAGOGASTRODUODENOSCOPY (EGD) WITH PROPOFOL (N/A )     Patient location during evaluation: PACU Anesthesia Type: MAC Level of consciousness: awake and alert Pain management: pain level controlled Vital Signs Assessment: post-procedure vital signs reviewed and stable Respiratory status: spontaneous breathing, nonlabored ventilation, respiratory function stable and patient connected to nasal cannula oxygen Cardiovascular status: stable and blood pressure returned to baseline Postop Assessment: no apparent nausea or vomiting Anesthetic complications: no    Last Vitals:  Vitals:   11/20/17 1329 11/20/17 1345  BP:  127/77  Pulse:  63  Resp:    Temp:  36.4 C  SpO2: 96% 94%    Last Pain:  Vitals:   11/20/17 1345  TempSrc: Oral  PainSc:                  Effie Berkshire

## 2017-11-20 NOTE — Anesthesia Preprocedure Evaluation (Addendum)
Anesthesia Evaluation  Patient identified by MRN, date of birth, ID band Patient awake    Reviewed: Allergy & Precautions, NPO status , Patient's Chart, lab work & pertinent test results  Airway Mallampati: I  TM Distance: >3 FB Neck ROM: Full    Dental  (+) Dental Advisory Given, Upper Dentures   Pulmonary sleep apnea , COPD,  COPD inhaler, former smoker,    breath sounds clear to auscultation       Cardiovascular hypertension, Pt. on home beta blockers + CAD, + Cardiac Stents, + Peripheral Vascular Disease and +CHF  + dysrhythmias Atrial Fibrillation  Rhythm:Irregular Rate:Abnormal     Neuro/Psych negative neurological ROS     GI/Hepatic Neg liver ROS, hiatal hernia, GERD  Medicated,  Endo/Other  diabetes, Type 2, Oral Hypoglycemic AgentsHypothyroidism   Renal/GU Renal InsufficiencyRenal disease     Musculoskeletal  (+) Arthritis , Osteoarthritis,    Abdominal Normal abdominal exam  (+)   Peds  Hematology   Anesthesia Other Findings   Reproductive/Obstetrics                            Lab Results  Component Value Date   WBC 5.9 11/20/2017   HGB 10.2 (L) 11/20/2017   HCT 32.6 (L) 11/20/2017   MCV 79.9 11/20/2017   PLT 256 11/20/2017   Lab Results  Component Value Date   CREATININE 1.17 11/20/2017   BUN 16 11/20/2017   NA 135 11/20/2017   K 3.9 11/20/2017   CL 101 11/20/2017   CO2 24 11/20/2017   Lab Results  Component Value Date   INR 1.20 09/20/2017   INR 1.54 09/18/2017   INR 1.32 09/15/2017   EKG: atrial fibrillation.   Anesthesia Physical Anesthesia Plan  ASA: III  Anesthesia Plan: MAC   Post-op Pain Management:    Induction: Intravenous  PONV Risk Score and Plan: 1 and Propofol infusion  Airway Management Planned: Natural Airway  Additional Equipment: None  Intra-op Plan:   Post-operative Plan: Extubation in OR  Informed Consent: I have reviewed the  patients History and Physical, chart, labs and discussed the procedure including the risks, benefits and alternatives for the proposed anesthesia with the patient or authorized representative who has indicated his/her understanding and acceptance.   Dental advisory given  Plan Discussed with: CRNA  Anesthesia Plan Comments:        Anesthesia Quick Evaluation

## 2017-11-20 NOTE — Op Note (Signed)
[facility] Patient Name: Mike Wright Procedure Date : 11/20/2017 MRN: [id number] Attending MD: Docia Chuck. Henrene Pastor , MD Date of Birth: Nov 05, 1933 CSN: [id number] Age: 84 Admit Type: Inpatient Procedure:                Upper GI endoscopy Indications:              Abdominal pain,Unspecified anemia secondary                            question due to GI blood loss, Early satiety.                            Heme-negative stool Providers:                Docia Chuck. Henrene Pastor, MD, Angus Seller, Elspeth Cho                            Tech., Technician, Rejeana Brock, CRNA Referring MD:             [facility] hospitalist Medicines:                Monitored Anesthesia Care Complications:            No immediate complications. Estimated Blood Loss:     Estimated blood loss: none. Procedure:                Pre-Anesthesia Assessment:                           - Prior to the procedure, a History and Physical                            was performed, and patient medications and                            allergies were reviewed. The patient's tolerance of                            previous anesthesia was also reviewed. The risks                            and benefits of the procedure and the sedation                            options and risks were discussed with the patient.                            All questions were answered, and informed consent                            was obtained. Prior Anticoagulants: The patient has                            taken Plavix (clopidogrel), last dose was 1 day  prior to procedure. ASA Grade Assessment: III - A                            patient with severe systemic disease. After                            reviewing the risks and benefits, the patient was                            deemed in satisfactory condition to undergo the                            procedure.                           After obtaining informed consent,  the endoscope was                            passed under direct vision. Throughout the                            procedure, the patient's blood pressure, pulse, and                            oxygen saturations were monitored continuously. The                            GIF-H190 ([id number] Olympus adult EGD was introduced                            through the mouth, and advanced to the second part                            of duodenum. The upper GI endoscopy was                            accomplished without difficulty. The patient                            tolerated the procedure well. Scope In: Scope Out: Findings:      The esophagus was normal.      The stomach was normal.      The examined duodenum was normal.      The cardia and gastric fundus were normal on retroflexion. Impression:               - Normal esophagus.                           - Normal stomach.                           - Normal examined duodenum.                           - No specimens collected. Moderate Sedation:  none Recommendation:           - Patient has a contact number available for                            emergencies. The signs and symptoms of potential                            delayed complications were discussed with the                            patient. Return to normal activities tomorrow.                            Written discharge instructions were provided to the                            patient.                           - Resume previous diet.                           - Continue present medications.                           - Abdominal ultrasound                           - Zofran as needed for nausea                           - Once daily PPI to protect the upper GI mucosa                            given his comorbidities, chronic Plavix, chronic                            oral anticoagulation Procedure Code(s):        --- Professional ---                            [phone], Esophagogastroduodenoscopy, flexible,                            transoral; diagnostic, including collection of                            specimen(s) by brushing or washing, when performed                            (separate procedure) Diagnosis Code(s):        --- Professional ---                           R10.9, Unspecified abdominal pain  D50.0, Iron deficiency anemia secondary to blood                            loss (chronic)                           R68.81, Early satiety CPT copyright 2017 [employer]. All rights reserved. The codes documented in this report are preliminary and upon coder review may  be revised to meet current compliance requirements. Docia Chuck. Henrene Pastor, MD 11/20/2017 9:55:01 AM This report has been signed electronically. Number of Addenda: 0

## 2017-11-20 NOTE — Interval H&P Note (Signed)
History and Physical Interval Note:  11/20/2017 9:32 AM  Mike Wright  has presented today for surgery, with the diagnosis of anemia.  post prandial n/v, pain  The various methods of treatment have been discussed with the patient and family. After consideration of risks, benefits and other options for treatment, the patient has consented to  Procedure(s): ESOPHAGOGASTRODUODENOSCOPY (EGD) WITH PROPOFOL (N/A) as a surgical intervention .  The patient's history has been reviewed, patient examined, no change in status, stable for surgery.  I have reviewed the patient's chart and labs.  Questions were answered to the patient's satisfaction.     Mike Wright

## 2017-11-20 NOTE — Transfer of Care (Signed)
Immediate Anesthesia Transfer of Care Note  Patient: Mike Wright  Procedure(s) Performed: ESOPHAGOGASTRODUODENOSCOPY (EGD) WITH PROPOFOL (N/A )  Patient Location: Endoscopy Unit  Anesthesia Type:MAC  Level of Consciousness: awake, alert  and oriented  Airway & Oxygen Therapy: Patient Spontanous Breathing, nasal cannula  Post-op Assessment: Report given to RN and Post -op Vital signs reviewed and stable  Post vital signs: Reviewed and stable  Last Vitals:  Vitals Value Taken Time  BP 158/106 11/20/2017  9:56 AM  Temp    Pulse 70 11/20/2017  9:57 AM  Resp 22 11/20/2017  9:57 AM  SpO2 97 % 11/20/2017  9:57 AM  Vitals shown include unvalidated device data.  Last Pain:  Vitals:   11/20/17 0840  TempSrc: Oral  PainSc: 0-No pain      Patients Stated Pain Goal: 0 (84/03/75 4360)  Complications: No apparent anesthesia complications

## 2017-11-21 ENCOUNTER — Inpatient Hospital Stay (HOSPITAL_COMMUNITY): Payer: Medicare Other

## 2017-11-21 ENCOUNTER — Encounter (HOSPITAL_COMMUNITY): Payer: Self-pay | Admitting: Internal Medicine

## 2017-11-21 DIAGNOSIS — D649 Anemia, unspecified: Secondary | ICD-10-CM | POA: Diagnosis not present

## 2017-11-21 DIAGNOSIS — I2510 Atherosclerotic heart disease of native coronary artery without angina pectoris: Secondary | ICD-10-CM | POA: Diagnosis not present

## 2017-11-21 DIAGNOSIS — Z7901 Long term (current) use of anticoagulants: Secondary | ICD-10-CM | POA: Diagnosis not present

## 2017-11-21 DIAGNOSIS — Z7902 Long term (current) use of antithrombotics/antiplatelets: Secondary | ICD-10-CM | POA: Diagnosis not present

## 2017-11-21 DIAGNOSIS — R1013 Epigastric pain: Secondary | ICD-10-CM

## 2017-11-21 DIAGNOSIS — E782 Mixed hyperlipidemia: Secondary | ICD-10-CM | POA: Diagnosis not present

## 2017-11-21 DIAGNOSIS — I208 Other forms of angina pectoris: Secondary | ICD-10-CM | POA: Diagnosis not present

## 2017-11-21 DIAGNOSIS — I5032 Chronic diastolic (congestive) heart failure: Secondary | ICD-10-CM | POA: Diagnosis not present

## 2017-11-21 DIAGNOSIS — R161 Splenomegaly, not elsewhere classified: Secondary | ICD-10-CM | POA: Diagnosis not present

## 2017-11-21 DIAGNOSIS — I25119 Atherosclerotic heart disease of native coronary artery with unspecified angina pectoris: Secondary | ICD-10-CM | POA: Diagnosis not present

## 2017-11-21 DIAGNOSIS — I10 Essential (primary) hypertension: Secondary | ICD-10-CM | POA: Diagnosis not present

## 2017-11-21 DIAGNOSIS — R109 Unspecified abdominal pain: Secondary | ICD-10-CM | POA: Diagnosis not present

## 2017-11-21 DIAGNOSIS — R110 Nausea: Secondary | ICD-10-CM | POA: Diagnosis not present

## 2017-11-21 DIAGNOSIS — I480 Paroxysmal atrial fibrillation: Secondary | ICD-10-CM | POA: Diagnosis not present

## 2017-11-21 DIAGNOSIS — K8020 Calculus of gallbladder without cholecystitis without obstruction: Secondary | ICD-10-CM | POA: Diagnosis not present

## 2017-11-21 DIAGNOSIS — E039 Hypothyroidism, unspecified: Secondary | ICD-10-CM | POA: Diagnosis not present

## 2017-11-21 DIAGNOSIS — D500 Iron deficiency anemia secondary to blood loss (chronic): Secondary | ICD-10-CM | POA: Diagnosis not present

## 2017-11-21 DIAGNOSIS — R6881 Early satiety: Secondary | ICD-10-CM | POA: Diagnosis not present

## 2017-11-21 DIAGNOSIS — I700 Atherosclerosis of aorta: Secondary | ICD-10-CM | POA: Diagnosis not present

## 2017-11-21 DIAGNOSIS — I708 Atherosclerosis of other arteries: Secondary | ICD-10-CM | POA: Diagnosis not present

## 2017-11-21 DIAGNOSIS — I110 Hypertensive heart disease with heart failure: Secondary | ICD-10-CM | POA: Diagnosis not present

## 2017-11-21 LAB — BASIC METABOLIC PANEL
ANION GAP: 10 (ref 5–15)
BUN: 14 mg/dL (ref 8–23)
CALCIUM: 8.8 mg/dL — AB (ref 8.9–10.3)
CO2: 22 mmol/L (ref 22–32)
CREATININE: 1.07 mg/dL (ref 0.61–1.24)
Chloride: 105 mmol/L (ref 98–111)
GFR calc Af Amer: >60 mL/min (ref >60)
GLUCOSE: 149 mg/dL — AB (ref 70–99)
Potassium: 4.3 mmol/L (ref 3.5–5.1)
Sodium: 137 mmol/L (ref 135–145)

## 2017-11-21 LAB — CBC
HCT: 33.9 % — ABNORMAL LOW (ref 39.0–52.0)
Hemoglobin: 10.5 g/dL — ABNORMAL LOW (ref 13.0–17.0)
MCH: 24.8 pg — AB (ref 26.0–34.0)
MCHC: 31 g/dL (ref 30.0–36.0)
MCV: 80.1 fL (ref 78.0–100.0)
PLATELETS: 243 10*3/uL (ref 150–400)
RBC: 4.23 MIL/uL (ref 4.22–5.81)
RDW: 17.8 % — AB (ref 11.5–15.5)
WBC: 5 10*3/uL (ref 4.0–10.5)

## 2017-11-21 LAB — GLUCOSE, CAPILLARY
GLUCOSE-CAPILLARY: 160 mg/dL — AB (ref 70–99)
Glucose-Capillary: 129 mg/dL — ABNORMAL HIGH (ref 70–99)
Glucose-Capillary: 254 mg/dL — ABNORMAL HIGH (ref 70–99)
Glucose-Capillary: 217 mg/dL — ABNORMAL HIGH (ref 70–99)

## 2017-11-21 MED ORDER — IOPAMIDOL (ISOVUE-370) INJECTION 76%
100.0000 mL | Freq: Once | INTRAVENOUS | Status: AC | PRN
  Administered 2017-11-21: 100 mL via INTRAVENOUS

## 2017-11-21 MED ORDER — FUROSEMIDE 20 MG PO TABS
20.0000 mg | ORAL_TABLET | Freq: Every day | ORAL | Status: DC
  Administered 2017-11-22: 20 mg via ORAL
  Filled 2017-11-21: qty 1

## 2017-11-21 MED ORDER — IOPAMIDOL (ISOVUE-370) INJECTION 76%
INTRAVENOUS | Status: AC
  Filled 2017-11-21: qty 100

## 2017-11-21 NOTE — Progress Notes (Addendum)
Daily Rounding Note  11/21/2017, 9:38 AM  LOS: 4 days   SUBJECTIVE:   Chief complaint:     Still with PP abd pain, not so much vomiting.    OBJECTIVE:         Vital signs in last 24 hours:    Temp:  [97.6 F (36.4 C)-98.3 F (36.8 C)] 98.3 F (36.8 C) (08/27 0509) Pulse Rate:  [61-119] 69 (08/27 0836) Resp:  [20-24] 22 (08/27 0509) BP: (93-135)/(60-79) 133/79 (08/27 0836) SpO2:  [93 %-98 %] 96 % (08/27 0825) FiO2 (%):  [28 %] 28 % (08/26 1329) Weight:  [92.9 kg] 92.9 kg (08/27 0509) Last BM Date: 11/18/17 Filed Weights   11/20/17 0300 11/20/17 0840 11/21/17 0509  Weight: 94.3 kg 88.9 kg 92.9 kg   General: comfortable.  Not ill looking   Heart: RRR Chest: clear bil.  No sob Abdomen: soft, NT, ND.  Active BS  Extremities: hallux amputations bil.   Neuro/Psych:  Oriented x 3.  Alert, no gross deficits.    Intake/Output from previous day: 08/26 0701 - 08/27 0700 In: 680 [P.O.:480; I.V.:200] Out: 1375 [Urine:1375]  Intake/Output this shift: No intake/output data recorded.  Lab Results: Recent Labs    11/19/17 0524 11/20/17 0438 11/21/17 0318  WBC 5.6 5.9 5.0  HGB 10.2* 10.2* 10.5*  HCT 33.0* 32.6* 33.9*  PLT 218 256 243   BMET Recent Labs    11/19/17 0524 11/20/17 0438 11/21/17 0318  NA 138 135 137  K 3.7 3.9 4.3  CL 102 101 105  CO2 24 24 22   GLUCOSE 83 126* 149*  BUN 14 16 14   CREATININE 1.12 1.17 1.07  CALCIUM 8.7* 8.7* 8.8*   LFT Recent Labs    11/19/17 0524  PROT 6.3*  ALBUMIN 3.1*  AST 18  ALT 12  ALKPHOS 73  BILITOT 1.2  BILIDIR 0.3*  IBILI 0.9    Scheduled Meds: . amLODipine  10 mg Oral Daily  . atorvastatin  80 mg Oral QPM  . clopidogrel  75 mg Oral Daily  . collagenase  1 application Topical Daily  . finasteride  5 mg Oral Daily  . furosemide  20 mg Intravenous Once  . insulin aspart  0-15 Units Subcutaneous TID WC  . insulin glargine  20 Units Subcutaneous BID    . metoprolol succinate  100 mg Oral Daily  . pantoprazole  40 mg Oral Daily  . psyllium  1 packet Oral Daily  . sulfamethoxazole-trimethoprim  1 tablet Oral BID  . tiotropium  18 mcg Inhalation Daily  . traZODone  100 mg Oral QHS   Continuous Infusions: . nitroGLYCERIN     PRN Meds:.acetaminophen, gi cocktail, ipratropium-albuterol, nitroGLYCERIN, ondansetron (ZOFRAN) IV   ASSESMENT:   *   Non-cardiac chest, epigastric pain.  Chronic PP abd pain and nausea.  Normal LFTs.   8/26 EGD: normal study.   Ultrasound abdomen completed this AM.  Report pending.   Sxs post prandial, if ultrasound is negative may need to assess mesenteric vasculature and/or gastric emptying study (but no retained gastric contents on EGD).     *   Normocytic anemia.  FOBT negative.    *   Chronic Plavix, Eliquis.  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  Home on IV vanc, switched to 14 days po septra 8 days ago Monday 8/19   PLAN   *  Await ultrasound report.  If negative, ? Pursue mesnteric CT angio?   Gastric emptying study may be useful as well.    Azucena Freed  11/21/2017, 9:38 AM Phone [phone]  GI ATTENDING  Interval history data reviewed. Patient seen and examined. Agree with interval progress note. Abdominal ultrasound shows gallstones without complicating features. Though he could have symptomatic cholelithiasis as a cause for postprandial abdominal pain, I believe that further imaging is warranted with CT angiogram of the abdomen to rule out mesenteric ischemia as well as evaluate the pancreas for any issues. Will follow in the interim, continue PPI, antiemetics as needed, and advance diet as tolerated.  Docia Chuck. Geri Seminole., M.D. [facility] Division of Gastroenterology.

## 2017-11-21 NOTE — Progress Notes (Signed)
Progress Note  Patient Name: Mike Wright Date of Encounter: 11/21/2017  Primary Cardiologist: Carlyle Dolly, MD   Subjective   No more CP or Dyspnea.  Still has some Post-Prandial Abdominal pain, but no more N/V  Inpatient Medications    Scheduled Meds: . amLODipine  10 mg Oral Daily  . atorvastatin  80 mg Oral QPM  . clopidogrel  75 mg Oral Daily  . collagenase  1 application Topical Daily  . finasteride  5 mg Oral Daily  . furosemide  20 mg Intravenous Once  . insulin aspart  0-15 Units Subcutaneous TID WC  . insulin glargine  20 Units Subcutaneous BID  . iopamidol      . metoprolol succinate  100 mg Oral Daily  . pantoprazole  40 mg Oral Daily  . psyllium  1 packet Oral Daily  . sulfamethoxazole-trimethoprim  1 tablet Oral BID  . tiotropium  18 mcg Inhalation Daily  . traZODone  100 mg Oral QHS   Continuous Infusions: . nitroGLYCERIN     PRN Meds: acetaminophen, gi cocktail, ipratropium-albuterol, nitroGLYCERIN, ondansetron (ZOFRAN) IV   Vital Signs    Vitals:   11/21/17 0836 11/21/17 1413 11/21/17 2128 11/21/17 2228  BP: 133/79 126/66 135/72   Pulse: 69 (!) 54 69 65  Resp:   (!) 24 (!) 24  Temp:  97.8 F (36.6 C) 98.5 F (36.9 C)   TempSrc:  Oral Oral   SpO2:  95% 96% 97%  Weight:      Height:        Intake/Output Summary (Last 24 hours) at 11/21/2017 2245 Last data filed at 11/21/2017 2229 Gross per 24 hour  Intake 1020 ml  Output 1050 ml  Net -30 ml   Filed Weights   11/20/17 0300 11/20/17 0840 11/21/17 0509  Weight: 94.3 kg 88.9 kg 92.9 kg    Telemetry    NSR - Personally Reviewed  ECG    N/A- Personally Reviewed  Physical Exam   Physical Exam  Constitutional: He is oriented to person, place, and time. He appears well-developed and well-nourished. No distress.  Healthy appearing  HENT:  Head: Normocephalic and atraumatic.  Neck: No JVD (~8 cm H20) present.  Cardiovascular: Normal rate, regular rhythm, normal heart sounds  and intact distal pulses. Exam reveals no gallop and no friction rub.  No murmur heard. Pulmonary/Chest: Effort normal and breath sounds normal. No respiratory distress. He has no wheezes. He has no rales.  Abdominal: Soft. Bowel sounds are normal. He exhibits no distension. There is no tenderness. There is no rebound.  Musculoskeletal: Normal range of motion. He exhibits no edema.  Neurological: He is alert and oriented to person, place, and time.  Psychiatric: He has a normal mood and affect. His behavior is normal. Judgment and thought content normal.  Nursing note and vitals reviewed.     Labs    Chemistry Recent Labs  Lab 11/19/17 0524 11/20/17 0438 11/21/17 0318  NA 138 135 137  K 3.7 3.9 4.3  CL 102 101 105  CO2 24 24 22   GLUCOSE 83 126* 149*  BUN 14 16 14   CREATININE 1.12 1.17 1.07  CALCIUM 8.7* 8.7* 8.8*  PROT 6.3*  --   --   ALBUMIN 3.1*  --   --   AST 18  --   --   ALT 12  --   --   ALKPHOS 73  --   --   BILITOT 1.2  --   --  GFRNONAA 59* 56* >60  GFRAA >60 >60 >60  ANIONGAP 12 10 10      Hematology Recent Labs  Lab 11/19/17 0524 11/20/17 0438 11/21/17 0318  WBC 5.6 5.9 5.0  RBC 4.15* 4.08*  4.08* 4.23  HGB 10.2* 10.2* 10.5*  HCT 33.0* 32.6* 33.9*  MCV 79.5 79.9 80.1  MCH 24.6* 25.0* 24.8*  MCHC 30.9 31.3 31.0  RDW 17.2* 17.6* 17.8*  PLT 218 256 243    Cardiac Enzymes Recent Labs  Lab 11/17/17 2144 11/18/17 0421 11/18/17 1026  TROPONINI <0.03 <0.03 <0.03    Recent Labs  Lab 11/17/17 1701  TROPIPOC 0.00     BNP Recent Labs  Lab 11/17/17 1548  BNP 192.0*     DDimer  Recent Labs  Lab 11/17/17 1548  DDIMER 0.48     Radiology    US Abdomen Complete  Result Date: 11/21/2017 CLINICAL DATA:  Abdominal pain over the last several weeks. EXAM: ABDOMEN ULTRASOUND COMPLETE COMPARISON:  None. FINDINGS: Gallbladder: Several small gallstones, the largest measuring 6 mm. Negative Murphy sign. No wall thickening or surrounding fluid.  Common bile duct: Diameter: 3.5 mm.  Normal. Liver: No focal lesion identified. Within normal limits in parenchymal echogenicity. Portal vein is patent on color Doppler imaging with normal direction of blood flow towards the liver. IVC: No abnormality visualized. Pancreas: Poorly seen because of overlying bowel gas. Spleen: Mild splenomegaly.  No focal lesion.  Splenic volume 734 cc Right Kidney: Length: 12.0. Echogenicity within normal limits. No mass or hydronephrosis visualized. Left Kidney: Length: 11.4. Echogenicity within normal limits. No mass or hydronephrosis visualized. Abdominal aorta: Normal. Other findings: No ascites IMPRESSION: Several small gallstones mobile within the gallbladder. No sonographic evidence of cholecystitis or obstruction. Mild splenomegaly. Electronically Signed   By: Nelson Chimes M.D.   On: 11/21/2017 09:44    -GI procedure reviewed - no PUD, gastritis etc.   Cardiac Studies   No new studies  Patient Profile     84 y.o. male admitted with chest pain, enzymes negative, worrisome for a GI source. Noted to have anemia and some volume overload, now with nausea and abdominal pain with eating. Notably anemic on admission - transfused.  R/o MI - plans for ischemic evaluation aborted.   Assessment & Plan    Principal Problem:   Chest pain Active Problems:   Iron deficiency anemia due to chronic blood loss   CAD S/P percutaneous coronary angioplasty   Coronary artery disease involving native coronary artery of native heart with angina pectoris (HCC)   Essential hypertension   Chronic diastolic CHF (congestive heart failure) (HCC)   PAF (paroxysmal atrial fibrillation) (HCC)   Acute on chronic anemia   Volume overload   Abdominal pain   Nausea without vomiting   Chest pain - probably related to existing CAD & anemia - resolved with transfusion.   Ruled out for further evaluation cardiac standpoint.  CAD-PCI with angina: on Plavix (no ASA b/c Eliquis for Afib)  -   Amlodipine and Toprol.  With no further pain, no need for nitrate.  Continue statin    Anemia (FE deficient) - he is s/p transfusion. Hgb stable -- will continue to hold Eliquis for now given recent PCI & need for Plavix (with 2 LAD stents, prefer to complete 1 yr Plavix, then d/c for Eliquis)..  Follow-up CBC needed in roughly 1 to 2 weeks.  Will start p.o. iron.  PAF -maintaining sinus rhythm.  On metoprolol.  Currently Eliquis on hold.  Will wait 1 month before restarting. BP stable on CCB & BB  HFpEF (DHF with mild volume overload) - related to anemia :   Breathing much improved.  Will probably need to restart oral Lasix.  Nausea and abdominal pain - appreciate GI service consult. -EGD done today => no notable findings.  NO evidence of elevated amylase/lipiase -- r/o pancreatitis.   Abd US done - + GS, but no evidence of cholelithiasis or choledocholithiasis.  I saw him just after returning back from his abdominal CTA to evaluate for potential mesenteric ischemia.   Essentially cardiology evaluation is complete.  We are waiting for completion of his GI evaluation for discharge.  Will anticipate potential discharge tomorrow pending GI evaluation.   For questions or updates, please contact [place] Please consult [email or web address] for contact info under Cardiology/STEMI.      Signed, Glenetta Hew, MD  11/21/2017, 10:45 PM  Patient ID: Mike Wright, male   DOB: [birth year], 84 y.o.   MRN: [id number]

## 2017-11-21 NOTE — Care Management Note (Signed)
Case Management Note  Patient Details  Name: Mike Wright MRN: [id number] Date of Birth: Apr 27, 1933  Subjective/Objective: Pt presented for Chest Pain. PTA from home with the support of wife. Plan will be to return home with [facility]-Previously active with [facility]. No DME needs identified at the time of visit.                    Action/Plan: Dan with [facility] is aware that patient is hospitalized. Pt will need resumption HH RN orders and F2F. Wife to provide transportation home. No further needs from CM at this time.   Expected Discharge Date:                  Expected Discharge Plan:  [place]  In-House Referral:  NA  Discharge planning Services  CM Consult  Post Acute Care Choice:  Home Health, Resumption of Svcs/PTA Provider Choice offered to:  Patient  DME Arranged:  N/A DME Agency:  NA  HH Arranged:  RN [place] Agency:  [place]  Status of Service:  Completed, signed off  If discussed at [place] of Stay Meetings, dates discussed:    Additional Comments:  Bethena Roys, RN 11/21/2017, 10:15 AM

## 2017-11-22 ENCOUNTER — Other Ambulatory Visit: Payer: Self-pay | Admitting: Cardiology

## 2017-11-22 ENCOUNTER — Encounter: Payer: Self-pay | Admitting: Physician Assistant

## 2017-11-22 DIAGNOSIS — D649 Anemia, unspecified: Secondary | ICD-10-CM | POA: Diagnosis not present

## 2017-11-22 DIAGNOSIS — I5032 Chronic diastolic (congestive) heart failure: Secondary | ICD-10-CM | POA: Diagnosis not present

## 2017-11-22 DIAGNOSIS — R6881 Early satiety: Secondary | ICD-10-CM | POA: Diagnosis not present

## 2017-11-22 DIAGNOSIS — I208 Other forms of angina pectoris: Secondary | ICD-10-CM | POA: Diagnosis not present

## 2017-11-22 DIAGNOSIS — R109 Unspecified abdominal pain: Secondary | ICD-10-CM | POA: Diagnosis not present

## 2017-11-22 DIAGNOSIS — I110 Hypertensive heart disease with heart failure: Secondary | ICD-10-CM | POA: Diagnosis not present

## 2017-11-22 DIAGNOSIS — D500 Iron deficiency anemia secondary to blood loss (chronic): Secondary | ICD-10-CM | POA: Diagnosis not present

## 2017-11-22 DIAGNOSIS — I2510 Atherosclerotic heart disease of native coronary artery without angina pectoris: Secondary | ICD-10-CM | POA: Diagnosis not present

## 2017-11-22 DIAGNOSIS — R110 Nausea: Secondary | ICD-10-CM | POA: Diagnosis not present

## 2017-11-22 DIAGNOSIS — D508 Other iron deficiency anemias: Secondary | ICD-10-CM

## 2017-11-22 DIAGNOSIS — I10 Essential (primary) hypertension: Secondary | ICD-10-CM | POA: Diagnosis not present

## 2017-11-22 DIAGNOSIS — E039 Hypothyroidism, unspecified: Secondary | ICD-10-CM | POA: Diagnosis not present

## 2017-11-22 DIAGNOSIS — Z7902 Long term (current) use of antithrombotics/antiplatelets: Secondary | ICD-10-CM | POA: Diagnosis not present

## 2017-11-22 DIAGNOSIS — Z7901 Long term (current) use of anticoagulants: Secondary | ICD-10-CM | POA: Diagnosis not present

## 2017-11-22 DIAGNOSIS — I25119 Atherosclerotic heart disease of native coronary artery with unspecified angina pectoris: Secondary | ICD-10-CM | POA: Diagnosis not present

## 2017-11-22 DIAGNOSIS — R1013 Epigastric pain: Secondary | ICD-10-CM | POA: Diagnosis not present

## 2017-11-22 DIAGNOSIS — E782 Mixed hyperlipidemia: Secondary | ICD-10-CM | POA: Diagnosis not present

## 2017-11-22 DIAGNOSIS — I480 Paroxysmal atrial fibrillation: Secondary | ICD-10-CM | POA: Diagnosis not present

## 2017-11-22 LAB — GLUCOSE, CAPILLARY
GLUCOSE-CAPILLARY: 128 mg/dL — AB (ref 70–99)
GLUCOSE-CAPILLARY: 233 mg/dL — AB (ref 70–99)

## 2017-11-22 MED ORDER — FERROUS SULFATE 325 (65 FE) MG PO TABS: 325.0000 mg | ORAL_TABLET | Freq: Two times a day (BID) | ORAL | Status: DC

## 2017-11-22 MED ORDER — FERROUS SULFATE 325 (65 FE) MG PO TABS: 325.0000 mg | ORAL_TABLET | Freq: Two times a day (BID) | ORAL | 1 refills | Status: DC

## 2017-11-22 MED ORDER — PANTOPRAZOLE SODIUM 40 MG PO TBEC: 40.0000 mg | DELAYED_RELEASE_TABLET | Freq: Every day | ORAL | 2 refills | Status: DC

## 2017-11-22 NOTE — Progress Notes (Addendum)
Daily Rounding Note  11/22/2017, 9:59 AM  LOS: 5 days   SUBJECTIVE:   PP abdominal pain, nsusea continue.  sxs not nearly as severe, seems to tolerate well.     OBJECTIVE:         Vital signs in last 24 hours:    Temp:  [97.8 F (36.6 C)-98.5 F (36.9 C)] 98.3 F (36.8 C) (08/28 0355) Pulse Rate:  [54-72] 72 (08/28 0826) Resp:  [19-24] 19 (08/28 0355) BP: (124-135)/(66-72) 124/71 (08/28 0826) SpO2:  [95 %-100 %] 100 % (08/28 0750) Weight:  [93.1 kg] 93.1 kg (08/28 0351) Last BM Date: 11/21/17 Filed Weights   11/20/17 0840 11/21/17 0509 11/22/17 0351  Weight: 88.9 kg 92.9 kg 93.1 kg   General: looks well   Heart: RRR Chest: clear bil Abdomen: soft, NT, ND.  Active BS  Extremities: no CCE Neuro/Psych:  Oriented x 3.  No tremor or gross deficits.    Intake/Output from previous day: 08/27 0701 - 08/28 0700 In: 1020 [P.O.:1020] Out: 1550 [Urine:1550]  Intake/Output this shift: Total I/O In: -  Out: 400 [Urine:400]  Lab Results: Recent Labs    11/20/17 0438 11/21/17 0318  WBC 5.9 5.0  HGB 10.2* 10.5*  HCT 32.6* 33.9*  PLT 256 243   BMET Recent Labs    11/20/17 0438 11/21/17 0318  NA 135 137  K 3.9 4.3  CL 101 105  CO2 24 22  GLUCOSE 126* 149*  BUN 16 14  CREATININE 1.17 1.07  CALCIUM 8.7* 8.8*   LFT No results for input(s): PROT, ALBUMIN, AST, ALT, ALKPHOS, BILITOT, BILIDIR, IBILI in the last 72 hours. PT/INR No results for input(s): LABPROT, INR in the last 72 hours. Hepatitis Panel No results for input(s): HEPBSAG, HCVAB, HEPAIGM, HEPBIGM in the last 72 hours.  Studies/Results: US Abdomen Complete  Result Date: 11/21/2017 CLINICAL DATA:  Abdominal pain over the last several weeks. EXAM: ABDOMEN ULTRASOUND COMPLETE COMPARISON:  None. FINDINGS: Gallbladder: Several small gallstones, the largest measuring 6 mm. Negative Murphy sign. No wall thickening or surrounding fluid. Common bile  duct: Diameter: 3.5 mm.  Normal. Liver: No focal lesion identified. Within normal limits in parenchymal echogenicity. Portal vein is patent on color Doppler imaging with normal direction of blood flow towards the liver. IVC: No abnormality visualized. Pancreas: Poorly seen because of overlying bowel gas. Spleen: Mild splenomegaly.  No focal lesion.  Splenic volume 734 cc Right Kidney: Length: 12.0. Echogenicity within normal limits. No mass or hydronephrosis visualized. Left Kidney: Length: 11.4. Echogenicity within normal limits. No mass or hydronephrosis visualized. Abdominal aorta: Normal. Other findings: No ascites IMPRESSION: Several small gallstones mobile within the gallbladder. No sonographic evidence of cholecystitis or obstruction. Mild splenomegaly. Electronically Signed   By: Nelson Chimes M.D.   On: 11/21/2017 09:44   Scheduled Meds: . amLODipine  10 mg Oral Daily  . atorvastatin  80 mg Oral QPM  . clopidogrel  75 mg Oral Daily  . collagenase  1 application Topical Daily  . finasteride  5 mg Oral Daily  . furosemide  20 mg Intravenous Once  . furosemide  20 mg Oral Daily  . insulin aspart  0-15 Units Subcutaneous TID WC  . insulin glargine  20 Units Subcutaneous BID  . metoprolol succinate  100 mg Oral Daily  . pantoprazole  40 mg Oral Daily  . psyllium  1 packet Oral Daily  . sulfamethoxazole-trimethoprim  1 tablet Oral BID  . tiotropium  18 mcg Inhalation Daily  . traZODone  100 mg Oral QHS   Continuous Infusions: . nitroGLYCERIN     PRN Meds:.acetaminophen, gi cocktail, ipratropium-albuterol, nitroGLYCERIN, ondansetron (ZOFRAN) IV  ASSESMENT:   *  Non cardiac CP PP abdominal pain and nausea.   EGD 8/26: normal.    Ultrasound with small uncomplicated gallstones.  Mild splenomegaly.  Normal liver and biliary tree.  CT angio completed, report pending.     *   Anemia.  Stable Hgb after transfusion.  Borderline low normal MCV.  Low iron, iron sat, ferritin low normal range  at 37 (rr 24-336).  B12, Folate ok.  [name]     PLAN   *  Await reading of CT angio.    *   ? Home later today on daily Protonix.  Azucena Freed  11/22/2017, 9:59 AM Phone [phone]  GI ATTENDING  Interval history and data reviewed. Patient personally seen and examined. Agree with interval progress note. Patient reports that his GI symptoms are much less problematic. Tolerated breakfast. Ultrasound shows gallstones. CT angiogram of the abdomen pending. He is anxious to go home. If CT scan does not show any significant abnormalities, then okay to go home from GI standpoint. I would keep him on an empiric PPI once daily. If the CT scan is unrevealing and the patient continues with postprandial abdominal pain, then surgical opinion regarding possible symptomatic cholelithiasis would be warranted. I discussed this with the patient and his wife. Please call for any questions or problems. Will sign off.  Docia Chuck. Geri Seminole., M.D. [facility] Division of Gastroenterology

## 2017-11-22 NOTE — Progress Notes (Signed)
Progress Note  Patient Name: Mike Wright Date of Encounter: 11/22/2017  Primary Cardiologist: Carlyle Dolly, MD   Subjective   Feels better today - eating lunch - less notable pain. CT Ab results read   Inpatient Medications    Scheduled Meds: . amLODipine  10 mg Oral Daily  . atorvastatin  80 mg Oral QPM  . clopidogrel  75 mg Oral Daily  . collagenase  1 application Topical Daily  . ferrous sulfate  325 mg Oral BID WC  . finasteride  5 mg Oral Daily  . furosemide  20 mg Intravenous Once  . furosemide  20 mg Oral Daily  . insulin aspart  0-15 Units Subcutaneous TID WC  . insulin glargine  20 Units Subcutaneous BID  . metoprolol succinate  100 mg Oral Daily  . pantoprazole  40 mg Oral Daily  . psyllium  1 packet Oral Daily  . sulfamethoxazole-trimethoprim  1 tablet Oral BID  . tiotropium  18 mcg Inhalation Daily  . traZODone  100 mg Oral QHS   Continuous Infusions: . nitroGLYCERIN     PRN Meds: acetaminophen, gi cocktail, ipratropium-albuterol, nitroGLYCERIN, ondansetron (ZOFRAN) IV   Vital Signs    Vitals:   11/22/17 0351 11/22/17 0355 11/22/17 0750 11/22/17 0826  BP:  132/72  124/71  Pulse:  68  72  Resp:  19    Temp:  98.3 F (36.8 C)    TempSrc:  Oral    SpO2:  98% 100%   Weight: 93.1 kg     Height:        Intake/Output Summary (Last 24 hours) at 11/22/2017 1225 Last data filed at 11/22/2017 1121 Gross per 24 hour  Intake 720 ml  Output 2600 ml  Net -1880 ml   Filed Weights   11/20/17 0840 11/21/17 0509 11/22/17 0351  Weight: 88.9 kg 92.9 kg 93.1 kg    Telemetry    NSR - Personally Reviewed  ECG    N/A- Personally Reviewed  Physical Exam   Physical Exam  Constitutional: He is oriented to person, place, and time. He appears well-developed and well-nourished. No distress.  Healthy appearing  HENT:  Head: Normocephalic and atraumatic.  Neck: JVD: ~8 cm H20.  Cardiovascular: Normal rate, regular rhythm, normal heart sounds and  intact distal pulses. Exam reveals no gallop and no friction rub.  No murmur heard. Pulmonary/Chest: Effort normal and breath sounds normal. No respiratory distress. He has no wheezes. He has no rales.  Abdominal: Soft. Bowel sounds are normal. There is no tenderness.  Musculoskeletal: Normal range of motion. He exhibits no edema.  Neurological: He is alert and oriented to person, place, and time.  Psychiatric: He has a normal mood and affect. His behavior is normal. Judgment and thought content normal.  Nursing note and vitals reviewed.     Labs    Chemistry Recent Labs  Lab 11/19/17 0524 11/20/17 0438 11/21/17 0318  NA 138 135 137  K 3.7 3.9 4.3  CL 102 101 105  CO2 24 24 22   GLUCOSE 83 126* 149*  BUN 14 16 14   CREATININE 1.12 1.17 1.07  CALCIUM 8.7* 8.7* 8.8*  PROT 6.3*  --   --   ALBUMIN 3.1*  --   --   AST 18  --   --   ALT 12  --   --   ALKPHOS 73  --   --   BILITOT 1.2  --   --   GFRNONAA 59* 56* >  60  GFRAA >60 >60 >60  ANIONGAP 12 10 10      Hematology Recent Labs  Lab 11/19/17 0524 11/20/17 0438 11/21/17 0318  WBC 5.6 5.9 5.0  RBC 4.15* 4.08*  4.08* 4.23  HGB 10.2* 10.2* 10.5*  HCT 33.0* 32.6* 33.9*  MCV 79.5 79.9 80.1  MCH 24.6* 25.0* 24.8*  MCHC 30.9 31.3 31.0  RDW 17.2* 17.6* 17.8*  PLT 218 256 243    Cardiac Enzymes Recent Labs  Lab 11/17/17 2144 11/18/17 0421 11/18/17 1026  TROPONINI <0.03 <0.03 <0.03    Recent Labs  Lab 11/17/17 1701  TROPIPOC 0.00     BNP Recent Labs  Lab 11/17/17 1548  BNP 192.0*     DDimer  Recent Labs  Lab 11/17/17 1548  DDIMER 0.48     Radiology    US Abdomen Complete  Result Date: 11/21/2017 CLINICAL DATA:  Abdominal pain over the last several weeks. EXAM: ABDOMEN ULTRASOUND COMPLETE COMPARISON:  None. FINDINGS: Gallbladder: Several small gallstones, the largest measuring 6 mm. Negative Murphy sign. No wall thickening or surrounding fluid. Common bile duct: Diameter: 3.5 mm.  Normal. Liver: No  focal lesion identified. Within normal limits in parenchymal echogenicity. Portal vein is patent on color Doppler imaging with normal direction of blood flow towards the liver. IVC: No abnormality visualized. Pancreas: Poorly seen because of overlying bowel gas. Spleen: Mild splenomegaly.  No focal lesion.  Splenic volume 734 cc Right Kidney: Length: 12.0. Echogenicity within normal limits. No mass or hydronephrosis visualized. Left Kidney: Length: 11.4. Echogenicity within normal limits. No mass or hydronephrosis visualized. Abdominal aorta: Normal. Other findings: No ascites IMPRESSION: Several small gallstones mobile within the gallbladder. No sonographic evidence of cholecystitis or obstruction. Mild splenomegaly. Electronically Signed   By: Nelson Chimes M.D.   On: 11/21/2017 09:44   Ct Angio Abd/pel W/ And/or W/o  Result Date: 11/22/2017 CLINICAL DATA:  84 year old male with a history of known vascular disease and postprandial pain EXAM: CTA ABDOMEN AND PELVIS wITHOUT AND WITH CONTRAST TECHNIQUE: Multidetector CT imaging of the abdomen and pelvis was performed using the standard protocol during bolus administration of intravenous contrast. Multiplanar reconstructed images and MIPs were obtained and reviewed to evaluate the vascular anatomy. CONTRAST:  166mL ISOVUE-370 IOPAMIDOL (ISOVUE-370) INJECTION 76% COMPARISON:  CT chest 03/24/2010 FINDINGS: VASCULAR Aorta: Mild atherosclerotic changes of the abdominal aorta. No aneurysm or dissection flap. No periaortic fluid. Celiac: Celiac artery patent with no significant stenosis. Mild atherosclerotic changes. Branches are patent. SMA: Superior mesenteric artery is patent with no significant atherosclerotic changes at the origin. Branches are patent. Renals: Bilateral renal arteries with mild atherosclerotic changes and no significant stenosis. Single renal arteries bilaterally. IMA: Inferior mesenteric artery is patent. Right lower extremity: Mild atherosclerotic  changes of the right iliac system with mild tortuosity. Hypogastric artery is patent. Common femoral arteries patent with mild atherosclerotic changes. Proximal SFA and profunda femoris patent. Left lower extremity: Mild atherosclerotic changes of the left iliac system with mild tortuosity. Hypogastric artery is patent. External iliac artery patent without significant stenosis. Common femoral artery patent with mild atherosclerotic changes. Proximal profunda femoris and SFA patent. Veins: Unremarkable appearance of the veins. Review of the MIP images confirms the above findings. NON-VASCULAR Lower chest: Scarring of the lower lungs with bronchiectasis. No acute finding of the lower chest. Native coronary calcifications. Hepatobiliary: Unremarkable liver . minimal hyperdense material within the gallbladder lumen compatible with cholelithiasis. No pericholecystic fluid. Pancreas: Punctate calcifications throughout the pancreas. Uniform density/enhancement of the pancreatic  parenchyma. No peripancreatic inflammatory changes. Spleen: Unremarkable spleen Adrenals/Urinary Tract: Unremarkable appearance of adrenal glands. Right: No hydronephrosis. Symmetric perfusion to the left. No nephrolithiasis. Unremarkable course of the right ureter. Minimal perinephric stranding. Left: No hydronephrosis. There is decreased perfusion at the superior and posterior left renal cortex, compared to the remainder of the renal cortex. No nephrolithiasis. Unremarkable course of the left ureter. Minimal perinephric stranding. Circumferential urinary bladder wall thickening with partial distention of urinary bladder. Stomach/Bowel: Unremarkable appearance of the stomach. Unremarkable appearance of small bowel. No evidence of obstruction. Colonic diverticula without associated inflammatory changes. Mild to moderate stool burden including formed stool within the rectum. No evidence of obstruction. Appendix is not visualized, however, no  inflammatory changes are present adjacent to the cecum to indicate an appendicitis. Lymphatic: No lymphadenopathy. Mesenteric: No free fluid or air. No adenopathy. Reproductive: Transverse diameter of the prostate measures 4.2 cm. Other: Bilateral fat containing inguinal hernia. Musculoskeletal: No acute displaced fracture. Surgical changes of the lower lumbar spine. Grade 1 anterolisthesis of L5 on S1. Degenerative changes of the hips. IMPRESSION: CT angiogram of the abdomen does not support chronic mesenteric ischemia as a source of the patient's postprandial abdominal pain. Decreased perfusion of the left renal cortex at the superior and posterior aspect. This may reflect slow flow given the presence atherosclerosis, though the differential would include both renal infarction as well as renal infection/pyelonephritis. Correlation with lab values may be useful. There is no evidence of high-grade stenosis at the left renal artery origin. Mild atherosclerotic changes of the abdominal aorta with associated mild vascular disease of the bilateral iliac arteries. Incidental imaging of native coronary artery disease. Circumferential urinary bladder wall thickening, potentially chronic thickening or related to cystitis. Moderate to large stool burden, may represent constipation. No evidence of bowel obstruction. Diverticular disease without evidence of acute diverticulitis. Signed, Dulcy Fanny. Dellia Nims, RPVI [facility] Radiology Electronically Signed   By: Corrie Mckusick D.O.   On: 11/22/2017 11:47    -GI procedure reviewed - no PUD, gastritis etc.   Cardiac Studies   No new studies  Patient Profile     84 y.o. male admitted with chest pain, enzymes negative, worrisome for a GI source. Noted to have anemia and some volume overload, now with nausea and abdominal pain with eating. Notably anemic on admission - transfused.  R/o MI - plans for ischemic evaluation  aborted.   Assessment & Plan    Principal Problem:   Chest pain Active Problems:   Iron deficiency anemia due to chronic blood loss   CAD S/P percutaneous coronary angioplasty   Coronary artery disease involving native coronary artery of native heart with angina pectoris (HCC)   Essential hypertension   Chronic diastolic CHF (congestive heart failure) (HCC)   PAF (paroxysmal atrial fibrillation) (HCC)   Acute on chronic anemia   Volume overload   Abdominal pain   Nausea without vomiting   Chest pain - probably related to existing CAD & anemia - resolved with transfusion.   R/o MI - no further eval  CAD-PCI with angina: on Plavix (no ASA b/c Eliquis for Afib) -   Amlodipine and Toprol.  With no further pain, no need for nitrate.  Continue statin -- continue meds  Anemia (FE deficient) - he is s/p transfusion. Hgb stable -- will continue to hold Eliquis for now given recent PCI & need for Plavix (with 2 LAD stents, prefer to complete 1 yr Plavix, then d/c for  Eliquis)..  Follow-up CBC needed in roughly 1 to 2 weeks.  Will start p.o. iron.  PAF -maintaining sinus rhythm.  On metoprolol.  Currently Eliquis on hold.  Will wait 1 month before restarting. BP stable on CCB & BB  HFpEF (DHF with mild volume overload) - related to anemia :   Breathing much improved. Restarted oral Lasix 20 mg.  Nausea and abdominal pain - appreciate GI service consult. -EGD done today => no notable findings.  NO evidence of elevated amylase/lipiase -- r/o pancreatitis.   Abd US done - + GS, but no evidence of cholelithiasis or choledocholithiasis.  I saw him just after returning back from his abdominal CTA to evaluate for potential mesenteric ischemia.   Essentially cardiology evaluation is complete.    See GI recommendations.  Plan was that he should be fine for discharge pending results of CT abdomen.  He was currently read is no signs of mesenteric ischemia.  Therefore stable for discharge.   We will follow-up with outpatient cardiology and GI.   For questions or updates, please contact [facility] Please consult [email or web address] for contact info under Cardiology/STEMI.      Signed, Glenetta Hew, MD  11/22/2017, 12:25 PM  Patient ID: [name], male   DOB: 10-Nov-1933, 84 y.o.   MRN: [id number]

## 2017-11-22 NOTE — Discharge Summary (Signed)
Discharge Summary    Patient ID: Mike Wright,  MRN: [id number], DOB/AGE: 1933/06/10 84 y.o.  Admit date: 11/17/2017 Discharge date: 11/22/2017  Primary Care Provider: [facility], [facility] Primary Cardiologist: Dr. [name]   Discharge Diagnoses    Principal Problem:   Chest pain Active Problems:   Essential hypertension   Iron deficiency anemia due to chronic blood loss   CAD S/P percutaneous coronary angioplasty   Chronic diastolic CHF (congestive heart failure) (HCC)   PAF (paroxysmal atrial fibrillation) (HCC)   Acute on chronic anemia   Volume overload   Abdominal pain   Nausea without vomiting   Coronary artery disease involving native coronary artery of native heart with angina pectoris (HCC)   Allergies Allergies  Allergen Reactions  . Codeine Shortness Of Breath  . Heparin Other (See Comments)    +HIT,  Severe bleeding (with heparin drip & large doses)  . Losartan Swelling    Per ENT UNSPECIFIED REACTION   . Other Other (See Comments)    Severe bleeding UNSPECIFIED AGENT   . Oxycodone Other (See Comments)    "Made me act out of my mind" Other reaction(s): Other (See Comments) Mental status changes hallucinations    Diagnostic Studies/Procedures    Abd [place] CT abd _____________   History of Present Illness     84 y.o. male with a history of CAD with prior PCI, PAD, AF, HFpEF, HTN, DM, HLD, who was admitted with chest pain.   The patient had a history of multiple cardiac comorbidities and follows in clinic with Dr. [name]. He underwent LHC in February for chest pain and underwent PCI to the LAD x2 and D1. He was seen in clinic 11/17/17 for several weeks of progressive chest pain and dyspnea with exertion. His pain was a pressure sensation with radiation to his neck and L arm and abdomen. ECG in clinic was nonacute, but due to concern for possible ACS, he was sent to the ED.   In the ED, ECG showed NSR with no acute ischemic changes. Troponin was  negative x1, and BNP 192. Hgb 8.1-8.5, although recent baseline ~9. CXR showed bibasilar atalectasis. Due to ongoing chest pain, he was given ASA 325, and SL NTG. Nitro gtt was ordered (but not started) and transferred to [facility] for further care.  On arrival to [facility], VSS and pt chest pain free with NTG infusion off. He reported that he had been having dyspnea and chest pressure with has been progressive over the past several months that worsened in the past week. He had taken multiple doses of NTG with improvement of his pain. He stated that he was coughing up small amounts of clear sputum, which is new for him. He stated that his weight was stable. He stated that he was only on home O2 from February to about April. Given his symptoms he was admitted for further work up.   Hospital Course     Consultants: GI  1. Chest pain - probably related to existing CAD & anemia - resolved with transfusion. trop neg x3.   R/o MI - no further eval  2. CAD-PCI with angina: on Plavix (no ASA b/c Eliquis for Afib) -   Amlodipine and Toprol.  With no further pain, no need for nitrate.  Continue statin  3. Anemia (FE deficient) - he required a transfusion. Hgb stable -- will continue to hold Eliquis at the time of discharge given recent PCI & need for Plavix (with  2 LAD stents, prefer to complete 1 yr Plavix, then d/c for Eliquis)..  Plan for follow-up CBC needed in roughly 1 to 2 weeks. Started p.o. Iron at discharge.  4. PAF -maintaining sinus rhythm.  On metoprolol.  Currently Eliquis on hold and plan to wait 1 month before restarting. BP stable on CCB & BB  5. HFpEF (DHF with mild volume overload) - related to anemia :   Breathing much improved. Restarted oral Lasix 20 mg at the time of discharge.  6. Nausea and abdominal pain - appreciate GI service consult. -EGD done => no notable findings.  NO evidence of elevated amylase/lipiase -- r/o pancreatitis.   Abd US done - + GS, but no  evidence of cholelithiasis or choledocholithiasis.  CT with no mesenteric ischemia. Planned for GI follow up as an outpatient.   Mike Wright was seen by Dr. Ellyn Hack and determined stable for discharge home. Follow up in the office has been arranged. Medications are listed below.   _____________  Discharge Vitals Blood pressure 124/71, pulse 72, temperature 98.3 F (36.8 C), temperature source Oral, resp. rate 19, height 5\' 7"  (1.702 m), weight 93.1 kg, SpO2 100 %.  Filed Weights   11/20/17 0840 11/21/17 0509 11/22/17 0351  Weight: 88.9 kg 92.9 kg 93.1 kg    Labs & Radiologic Studies    CBC Recent Labs    11/20/17 0438 11/21/17 0318  WBC 5.9 5.0  HGB 10.2* 10.5*  HCT 32.6* 33.9*  MCV 79.9 80.1  PLT 256 921   Basic Metabolic Panel Recent Labs    11/20/17 0438 11/21/17 0318  NA 135 137  K 3.9 4.3  CL 101 105  CO2 24 22  GLUCOSE 126* 149*  BUN 16 14  CREATININE 1.17 1.07  CALCIUM 8.7* 8.8*   Liver Function Tests No results for input(s): AST, ALT, ALKPHOS, BILITOT, PROT, ALBUMIN in the last 72 hours. No results for input(s): LIPASE, AMYLASE in the last 72 hours. Cardiac Enzymes No results for input(s): CKTOTAL, CKMB, CKMBINDEX, TROPONINI in the last 72 hours. BNP Invalid input(s): POCBNP D-Dimer No results for input(s): DDIMER in the last 72 hours. Hemoglobin A1C No results for input(s): HGBA1C in the last 72 hours. Fasting Lipid Panel No results for input(s): CHOL, HDL, LDLCALC, TRIG, CHOLHDL, LDLDIRECT in the last 72 hours. Thyroid Function Tests No results for input(s): TSH, T4TOTAL, T3FREE, THYROIDAB in the last 72 hours.  Invalid input(s): FREET3 _____________  Dg Chest 2 View  Result Date: 11/17/2017 CLINICAL DATA:  Shortness of breath.  Chest pains.  Cough. EXAM: CHEST - 2 VIEW COMPARISON:  09/04/2017. FINDINGS: Mediastinum and hilar structures normal. Mild bibasilar atelectasis/infiltrates. No pleural effusion or pneumothorax. Stable mild  cardiomegaly. No acute bony abnormality. IMPRESSION: Mild bibasilar atelectasis/infiltrates. Electronically Signed   By: Marcello Moores  Register   On: 11/17/2017 16:30   US Abdomen Complete  Result Date: 11/21/2017 CLINICAL DATA:  Abdominal pain over the last several weeks. EXAM: ABDOMEN ULTRASOUND COMPLETE COMPARISON:  None. FINDINGS: Gallbladder: Several small gallstones, the largest measuring 6 mm. Negative Murphy sign. No wall thickening or surrounding fluid. Common bile duct: Diameter: 3.5 mm.  Normal. Liver: No focal lesion identified. Within normal limits in parenchymal echogenicity. Portal vein is patent on color Doppler imaging with normal direction of blood flow towards the liver. IVC: No abnormality visualized. Pancreas: Poorly seen because of overlying bowel gas. Spleen: Mild splenomegaly.  No focal lesion.  Splenic volume 734 cc Right Kidney: Length: 12.0. Echogenicity within normal limits.  No mass or hydronephrosis visualized. Left Kidney: Length: 11.4. Echogenicity within normal limits. No mass or hydronephrosis visualized. Abdominal aorta: Normal. Other findings: No ascites IMPRESSION: Several small gallstones mobile within the gallbladder. No sonographic evidence of cholecystitis or obstruction. Mild splenomegaly. Electronically Signed   By: Nelson Chimes M.D.   On: 11/21/2017 09:44   Ct Angio Abd/pel W/ And/or W/o  Result Date: 11/22/2017 CLINICAL DATA:  84 year old male with a history of known vascular disease and postprandial pain EXAM: CTA ABDOMEN AND PELVIS wITHOUT AND WITH CONTRAST TECHNIQUE: Multidetector CT imaging of the abdomen and pelvis was performed using the standard protocol during bolus administration of intravenous contrast. Multiplanar reconstructed images and MIPs were obtained and reviewed to evaluate the vascular anatomy. CONTRAST:  180mL ISOVUE-370 IOPAMIDOL (ISOVUE-370) INJECTION 76% COMPARISON:  CT chest 03/24/2010 FINDINGS: VASCULAR Aorta: Mild atherosclerotic changes of the  abdominal aorta. No aneurysm or dissection flap. No periaortic fluid. Celiac: Celiac artery patent with no significant stenosis. Mild atherosclerotic changes. Branches are patent. SMA: Superior mesenteric artery is patent with no significant atherosclerotic changes at the origin. Branches are patent. Renals: Bilateral renal arteries with mild atherosclerotic changes and no significant stenosis. Single renal arteries bilaterally. IMA: Inferior mesenteric artery is patent. Right lower extremity: Mild atherosclerotic changes of the right iliac system with mild tortuosity. Hypogastric artery is patent. Common femoral arteries patent with mild atherosclerotic changes. Proximal SFA and profunda femoris patent. Left lower extremity: Mild atherosclerotic changes of the left iliac system with mild tortuosity. Hypogastric artery is patent. External iliac artery patent without significant stenosis. Common femoral artery patent with mild atherosclerotic changes. Proximal profunda femoris and SFA patent. Veins: Unremarkable appearance of the veins. Review of the MIP images confirms the above findings. NON-VASCULAR Lower chest: Scarring of the lower lungs with bronchiectasis. No acute finding of the lower chest. Native coronary calcifications. Hepatobiliary: Unremarkable liver . minimal hyperdense material within the gallbladder lumen compatible with cholelithiasis. No pericholecystic fluid. Pancreas: Punctate calcifications throughout the pancreas. Uniform density/enhancement of the pancreatic parenchyma. No peripancreatic inflammatory changes. Spleen: Unremarkable spleen Adrenals/Urinary Tract: Unremarkable appearance of adrenal glands. Right: No hydronephrosis. Symmetric perfusion to the left. No nephrolithiasis. Unremarkable course of the right ureter. Minimal perinephric stranding. Left: No hydronephrosis. There is decreased perfusion at the superior and posterior left renal cortex, compared to the remainder of the renal  cortex. No nephrolithiasis. Unremarkable course of the left ureter. Minimal perinephric stranding. Circumferential urinary bladder wall thickening with partial distention of urinary bladder. Stomach/Bowel: Unremarkable appearance of the stomach. Unremarkable appearance of small bowel. No evidence of obstruction. Colonic diverticula without associated inflammatory changes. Mild to moderate stool burden including formed stool within the rectum. No evidence of obstruction. Appendix is not visualized, however, no inflammatory changes are present adjacent to the cecum to indicate an appendicitis. Lymphatic: No lymphadenopathy. Mesenteric: No free fluid or air. No adenopathy. Reproductive: Transverse diameter of the prostate measures 4.2 cm. Other: Bilateral fat containing inguinal hernia. Musculoskeletal: No acute displaced fracture. Surgical changes of the lower lumbar spine. Grade 1 anterolisthesis of L5 on S1. Degenerative changes of the hips. IMPRESSION: CT angiogram of the abdomen does not support chronic mesenteric ischemia as a source of the patient's postprandial abdominal pain. Decreased perfusion of the left renal cortex at the superior and posterior aspect. This may reflect slow flow given the presence atherosclerosis, though the differential would include both renal infarction as well as renal infection/pyelonephritis. Correlation with lab values may be useful. There is no evidence of high-grade stenosis  at the left renal artery origin. Mild atherosclerotic changes of the abdominal aorta with associated mild vascular disease of the bilateral iliac arteries. Incidental imaging of native coronary artery disease. Circumferential urinary bladder wall thickening, potentially chronic thickening or related to cystitis. Moderate to large stool burden, may represent constipation. No evidence of bowel obstruction. Diverticular disease without evidence of acute diverticulitis. Signed, Dulcy Fanny. Dellia Nims, RPVI [facility] Radiology Electronically Signed   By: Corrie Mckusick D.O.   On: 11/22/2017 11:47   Disposition   Pt is being discharged home today in good condition.  Follow-up Plans & Appointments    Follow-up Information    [facility] Follow up.   Specialty:  Home Health Services Why:  Registered Nurse.  Contact information: [place] [address] [phone]        Arnoldo Lenis, MD Follow up.   Specialty:  Cardiology Why:  Office will call you with a follow up appt Contact information: [place] [address] [phone]        [facility] Follow up on 11/29/2017.   Why:  Please go in for follow up labs Contact information: [address] [phone]         Discharge Instructions    Diet - low sodium heart healthy   Complete by:  As directed    Discharge instructions   Complete by:  As directed    Please do not restarted your Eliquis until 12/23/17!!!   Increase activity slowly   Complete by:  As directed        Discharge Medications     Medication List    STOP taking these medications   lisinopril 5 MG tablet Commonly known as:  PRINIVIL,ZESTRIL     TAKE these medications   acetaminophen 500 MG tablet Commonly known as:  TYLENOL Take 500 mg by mouth as needed for mild pain or headache.   albuterol (2.5 MG/3ML) 0.083% nebulizer solution Commonly known as:  PROVENTIL Inhale 3 mLs into the lungs every 4 (four) hours as needed for wheezing or shortness of breath.   amLODipine 10 MG tablet Commonly known as:  NORVASC Take 1 tablet (10 mg total) by mouth daily.   apixaban 5 MG Tabs tablet Commonly known as:  ELIQUIS Take 1 tablet (5 mg total) by mouth 2 (two) times daily.   atorvastatin 80 MG tablet Commonly known as:  LIPITOR Take 1 tablet (80 mg total) by mouth every evening.   B-12 PO Take 1  tablet by mouth daily.   clopidogrel 75 MG tablet Commonly known as:  PLAVIX Take 1 tablet (75 mg total) by mouth daily.   collagenase ointment Commonly known as:  SANTYL Apply 1 application topically daily.   D3-1000 PO Take 1,000 Units by mouth daily.   ferrous sulfate 325 (65 FE) MG tablet Take 1 tablet (325 mg total) by mouth 2 (two) times daily with a meal.   finasteride 5 MG tablet Commonly known as:  PROSCAR Take 5 mg by mouth daily.   furosemide 20 MG tablet Commonly known as:  LASIX TAKE 1 TABLET DAILY AS NEEDED What changed:  when to take this   insulin glargine 100 UNIT/ML injection Commonly known as:  LANTUS Inject 30-40 Units into the skin See admin instructions. Inject 40 units SQ in the morning and inject 30 units SQ at bedtime   meclizine 12.5 MG  tablet Commonly known as:  ANTIVERT Take 1 tablet (12.5 mg total) by mouth 3 (three) times daily as needed for dizziness.   metFORMIN 1000 MG tablet Commonly known as:  GLUCOPHAGE Take 1,000 mg by mouth 2 (two) times daily.   metoprolol succinate 100 MG 24 hr tablet Commonly known as:  TOPROL-XL TAKE 1/2 TABLET EVERY DAY What changed:    how much to take  how to take this  when to take this  additional instructions   nitroGLYCERIN 0.4 MG SL tablet Commonly known as:  NITROSTAT PLACE 1 TABLET (0.4 MG TOTAL) UNDER THE TONGUE EVERY 5  MINUTES AS NEEDED FOR CHEST PAIN. What changed:  See the new instructions.   pantoprazole 40 MG tablet Commonly known as:  PROTONIX Take 1 tablet (40 mg total) by mouth daily. To protect stomach while taking multiple blood thinners.   psyllium 58.6 % packet Commonly known as:  METAMUCIL Take 1 packet by mouth daily.   sulfamethoxazole-trimethoprim 400-80 MG tablet Commonly known as:  BACTRIM,SEPTRA Take 1 tablet by mouth 2 (two) times daily.   traZODone 100 MG tablet Commonly known as:  DESYREL Take 100 mg by mouth at bedtime.        Acute coronary syndrome  (MI, NSTEMI, STEMI, etc) this admission?: No.     Outstanding Labs/Studies   CBC in a week.   Duration of Discharge Encounter   Greater than 30 minutes including physician time.  Signed, Reino Bellis NP-C 11/22/2017, 1:12 PM

## 2017-11-22 NOTE — Progress Notes (Signed)
Inpatient Diabetes Program Recommendations  AACE/ADA: New Consensus Statement on Inpatient Glycemic Control (2015)  Target Ranges:  Prepandial:   less than 140 mg/dL      Peak postprandial:   less than 180 mg/dL (1-2 hours)      Critically ill patients:  140 - 180 mg/dL   Lab Results  Component Value Date   GLUCAP 233 (H) 11/22/2017   HGBA1C 6.5 (H) 11/17/2017    Review of Glycemic Control Results for Mike Wright, Mike Wright (MRN [id number]) as of 11/22/2017 12:39  Ref. Range 11/21/2017 11:21 11/21/2017 17:19 11/21/2017 21:32 11/22/2017 07:39 11/22/2017 11:20  Glucose-Capillary Latest Ref Range: 70 - 99 mg/dL 254 (H) 160 (H) 217 (H) 128 (H) 233 (H)   Diabetes history: DM2 Outpatient Diabetes medications: Lantus 40 units am + 30 units pm + Metformin 1 gm bid Current orders for Inpatient glycemic control: Lantus 20 units bid + Novolog moderate correction tid  Inpatient Diabetes Program Recommendations:   While in the hospital and Metformin on hold: -Novolog 3 units tid meal coverage if patient eats 50%  Thank you, Nani Gasser. Zurie Platas, RN, MSN, CDE  Diabetes Coordinator Inpatient Glycemic Control Team Team Pager [phone] (8am-5pm) 11/22/2017 12:41 PM

## 2017-11-22 NOTE — Care Management Important Message (Signed)
Important Message  Patient Details  Name: Mike Wright MRN: [id number] Date of Birth: [birth year]   Medicare Important Message Given:  Yes    Christophr Calix P Fonda Rochon 11/22/2017, 3:53 PM

## 2017-11-23 DIAGNOSIS — E1151 Type 2 diabetes mellitus with diabetic peripheral angiopathy without gangrene: Secondary | ICD-10-CM | POA: Diagnosis not present

## 2017-11-23 DIAGNOSIS — B9562 Methicillin resistant Staphylococcus aureus infection as the cause of diseases classified elsewhere: Secondary | ICD-10-CM | POA: Diagnosis not present

## 2017-11-23 DIAGNOSIS — Z5181 Encounter for therapeutic drug level monitoring: Secondary | ICD-10-CM | POA: Diagnosis not present

## 2017-11-23 DIAGNOSIS — Z4781 Encounter for orthopedic aftercare following surgical amputation: Secondary | ICD-10-CM | POA: Diagnosis not present

## 2017-11-23 DIAGNOSIS — I70261 Atherosclerosis of native arteries of extremities with gangrene, right leg: Secondary | ICD-10-CM | POA: Diagnosis not present

## 2017-11-23 DIAGNOSIS — R7881 Bacteremia: Secondary | ICD-10-CM | POA: Diagnosis not present

## 2017-11-23 DIAGNOSIS — Z452 Encounter for adjustment and management of vascular access device: Secondary | ICD-10-CM | POA: Diagnosis not present

## 2017-11-23 DIAGNOSIS — I70262 Atherosclerosis of native arteries of extremities with gangrene, left leg: Secondary | ICD-10-CM | POA: Diagnosis not present

## 2017-11-23 DIAGNOSIS — Z792 Long term (current) use of antibiotics: Secondary | ICD-10-CM | POA: Diagnosis not present

## 2017-11-27 DIAGNOSIS — E1151 Type 2 diabetes mellitus with diabetic peripheral angiopathy without gangrene: Secondary | ICD-10-CM | POA: Diagnosis not present

## 2017-11-27 DIAGNOSIS — Z452 Encounter for adjustment and management of vascular access device: Secondary | ICD-10-CM | POA: Diagnosis not present

## 2017-11-27 DIAGNOSIS — Z4781 Encounter for orthopedic aftercare following surgical amputation: Secondary | ICD-10-CM | POA: Diagnosis not present

## 2017-11-27 DIAGNOSIS — B9562 Methicillin resistant Staphylococcus aureus infection as the cause of diseases classified elsewhere: Secondary | ICD-10-CM | POA: Diagnosis not present

## 2017-11-27 DIAGNOSIS — I70262 Atherosclerosis of native arteries of extremities with gangrene, left leg: Secondary | ICD-10-CM | POA: Diagnosis not present

## 2017-11-27 DIAGNOSIS — R7881 Bacteremia: Secondary | ICD-10-CM | POA: Diagnosis not present

## 2017-11-27 DIAGNOSIS — Z792 Long term (current) use of antibiotics: Secondary | ICD-10-CM | POA: Diagnosis not present

## 2017-11-27 DIAGNOSIS — Z5181 Encounter for therapeutic drug level monitoring: Secondary | ICD-10-CM | POA: Diagnosis not present

## 2017-11-27 DIAGNOSIS — I70261 Atherosclerosis of native arteries of extremities with gangrene, right leg: Secondary | ICD-10-CM | POA: Diagnosis not present

## 2017-12-01 DIAGNOSIS — B9562 Methicillin resistant Staphylococcus aureus infection as the cause of diseases classified elsewhere: Secondary | ICD-10-CM | POA: Diagnosis not present

## 2017-12-01 DIAGNOSIS — R7881 Bacteremia: Secondary | ICD-10-CM | POA: Diagnosis not present

## 2017-12-01 DIAGNOSIS — Z452 Encounter for adjustment and management of vascular access device: Secondary | ICD-10-CM | POA: Diagnosis not present

## 2017-12-01 DIAGNOSIS — Z4781 Encounter for orthopedic aftercare following surgical amputation: Secondary | ICD-10-CM | POA: Diagnosis not present

## 2017-12-01 DIAGNOSIS — Z5181 Encounter for therapeutic drug level monitoring: Secondary | ICD-10-CM | POA: Diagnosis not present

## 2017-12-01 DIAGNOSIS — Z792 Long term (current) use of antibiotics: Secondary | ICD-10-CM | POA: Diagnosis not present

## 2017-12-01 DIAGNOSIS — I70261 Atherosclerosis of native arteries of extremities with gangrene, right leg: Secondary | ICD-10-CM | POA: Diagnosis not present

## 2017-12-01 DIAGNOSIS — I70262 Atherosclerosis of native arteries of extremities with gangrene, left leg: Secondary | ICD-10-CM | POA: Diagnosis not present

## 2017-12-01 DIAGNOSIS — E1151 Type 2 diabetes mellitus with diabetic peripheral angiopathy without gangrene: Secondary | ICD-10-CM | POA: Diagnosis not present

## 2017-12-02 ENCOUNTER — Encounter (HOSPITAL_COMMUNITY): Payer: Self-pay | Admitting: Emergency Medicine

## 2017-12-02 ENCOUNTER — Emergency Department (HOSPITAL_COMMUNITY): Payer: Medicare HMO

## 2017-12-02 ENCOUNTER — Inpatient Hospital Stay (HOSPITAL_COMMUNITY)
Admission: EM | Admit: 2017-12-02 | Discharge: 2017-12-05 | DRG: 292 | Disposition: A | Discharge status: Home / Self Care | Payer: Medicare HMO | Attending: Family Medicine | Admitting: Family Medicine

## 2017-12-02 DIAGNOSIS — R110 Nausea: Secondary | ICD-10-CM | POA: Diagnosis not present

## 2017-12-02 DIAGNOSIS — Z955 Presence of coronary angioplasty implant and graft: Secondary | ICD-10-CM

## 2017-12-02 DIAGNOSIS — I130 Hypertensive heart and chronic kidney disease with heart failure and stage 1 through stage 4 chronic kidney disease, or unspecified chronic kidney disease: Secondary | ICD-10-CM | POA: Diagnosis not present

## 2017-12-02 DIAGNOSIS — S2241XA Multiple fractures of ribs, right side, initial encounter for closed fracture: Secondary | ICD-10-CM

## 2017-12-02 DIAGNOSIS — I4892 Unspecified atrial flutter: Secondary | ICD-10-CM | POA: Diagnosis not present

## 2017-12-02 DIAGNOSIS — J441 Chronic obstructive pulmonary disease with (acute) exacerbation: Secondary | ICD-10-CM

## 2017-12-02 DIAGNOSIS — Z981 Arthrodesis status: Secondary | ICD-10-CM | POA: Diagnosis not present

## 2017-12-02 DIAGNOSIS — E1122 Type 2 diabetes mellitus with diabetic chronic kidney disease: Secondary | ICD-10-CM | POA: Diagnosis present

## 2017-12-02 DIAGNOSIS — X58XXXA Exposure to other specified factors, initial encounter: Secondary | ICD-10-CM | POA: Diagnosis present

## 2017-12-02 DIAGNOSIS — R0600 Dyspnea, unspecified: Secondary | ICD-10-CM | POA: Diagnosis not present

## 2017-12-02 DIAGNOSIS — J438 Other emphysema: Secondary | ICD-10-CM | POA: Diagnosis present

## 2017-12-02 DIAGNOSIS — I10 Essential (primary) hypertension: Secondary | ICD-10-CM | POA: Diagnosis present

## 2017-12-02 DIAGNOSIS — J432 Centrilobular emphysema: Secondary | ICD-10-CM | POA: Diagnosis present

## 2017-12-02 DIAGNOSIS — Z7902 Long term (current) use of antithrombotics/antiplatelets: Secondary | ICD-10-CM

## 2017-12-02 DIAGNOSIS — N182 Chronic kidney disease, stage 2 (mild): Secondary | ICD-10-CM | POA: Diagnosis present

## 2017-12-02 DIAGNOSIS — Z7984 Long term (current) use of oral hypoglycemic drugs: Secondary | ICD-10-CM

## 2017-12-02 DIAGNOSIS — R0602 Shortness of breath: Secondary | ICD-10-CM | POA: Diagnosis not present

## 2017-12-02 DIAGNOSIS — E1152 Type 2 diabetes mellitus with diabetic peripheral angiopathy with gangrene: Secondary | ICD-10-CM | POA: Diagnosis present

## 2017-12-02 DIAGNOSIS — R05 Cough: Secondary | ICD-10-CM | POA: Diagnosis not present

## 2017-12-02 DIAGNOSIS — I2510 Atherosclerotic heart disease of native coronary artery without angina pectoris: Secondary | ICD-10-CM

## 2017-12-02 DIAGNOSIS — S299XXA Unspecified injury of thorax, initial encounter: Secondary | ICD-10-CM | POA: Diagnosis not present

## 2017-12-02 DIAGNOSIS — R0689 Other abnormalities of breathing: Secondary | ICD-10-CM | POA: Diagnosis not present

## 2017-12-02 DIAGNOSIS — Z79899 Other long term (current) drug therapy: Secondary | ICD-10-CM

## 2017-12-02 DIAGNOSIS — N179 Acute kidney failure, unspecified: Secondary | ICD-10-CM | POA: Diagnosis not present

## 2017-12-02 DIAGNOSIS — R0781 Pleurodynia: Secondary | ICD-10-CM | POA: Diagnosis not present

## 2017-12-02 DIAGNOSIS — Z96653 Presence of artificial knee joint, bilateral: Secondary | ICD-10-CM | POA: Diagnosis present

## 2017-12-02 DIAGNOSIS — Z794 Long term (current) use of insulin: Secondary | ICD-10-CM

## 2017-12-02 DIAGNOSIS — Z89411 Acquired absence of right great toe: Secondary | ICD-10-CM

## 2017-12-02 DIAGNOSIS — R7881 Bacteremia: Secondary | ICD-10-CM | POA: Diagnosis not present

## 2017-12-02 DIAGNOSIS — J9811 Atelectasis: Secondary | ICD-10-CM | POA: Diagnosis not present

## 2017-12-02 DIAGNOSIS — R0902 Hypoxemia: Secondary | ICD-10-CM | POA: Diagnosis not present

## 2017-12-02 DIAGNOSIS — R0789 Other chest pain: Secondary | ICD-10-CM | POA: Diagnosis not present

## 2017-12-02 DIAGNOSIS — Z9861 Coronary angioplasty status: Secondary | ICD-10-CM | POA: Diagnosis not present

## 2017-12-02 DIAGNOSIS — I480 Paroxysmal atrial fibrillation: Secondary | ICD-10-CM | POA: Diagnosis present

## 2017-12-02 DIAGNOSIS — Z23 Encounter for immunization: Secondary | ICD-10-CM | POA: Diagnosis not present

## 2017-12-02 DIAGNOSIS — Z89412 Acquired absence of left great toe: Secondary | ICD-10-CM | POA: Diagnosis not present

## 2017-12-02 DIAGNOSIS — Z885 Allergy status to narcotic agent status: Secondary | ICD-10-CM

## 2017-12-02 DIAGNOSIS — D509 Iron deficiency anemia, unspecified: Secondary | ICD-10-CM | POA: Diagnosis present

## 2017-12-02 DIAGNOSIS — I481 Persistent atrial fibrillation: Secondary | ICD-10-CM | POA: Diagnosis not present

## 2017-12-02 DIAGNOSIS — D500 Iron deficiency anemia secondary to blood loss (chronic): Secondary | ICD-10-CM

## 2017-12-02 DIAGNOSIS — I5032 Chronic diastolic (congestive) heart failure: Secondary | ICD-10-CM | POA: Diagnosis not present

## 2017-12-02 DIAGNOSIS — I5033 Acute on chronic diastolic (congestive) heart failure: Secondary | ICD-10-CM | POA: Diagnosis present

## 2017-12-02 DIAGNOSIS — D61818 Other pancytopenia: Secondary | ICD-10-CM | POA: Diagnosis not present

## 2017-12-02 DIAGNOSIS — I96 Gangrene, not elsewhere classified: Secondary | ICD-10-CM | POA: Diagnosis present

## 2017-12-02 DIAGNOSIS — E119 Type 2 diabetes mellitus without complications: Secondary | ICD-10-CM

## 2017-12-02 DIAGNOSIS — R509 Fever, unspecified: Secondary | ICD-10-CM | POA: Diagnosis not present

## 2017-12-02 DIAGNOSIS — Z87891 Personal history of nicotine dependence: Secondary | ICD-10-CM

## 2017-12-02 DIAGNOSIS — Z888 Allergy status to other drugs, medicaments and biological substances status: Secondary | ICD-10-CM

## 2017-12-02 DIAGNOSIS — A4902 Methicillin resistant Staphylococcus aureus infection, unspecified site: Secondary | ICD-10-CM | POA: Diagnosis not present

## 2017-12-02 LAB — LIPASE, BLOOD: Lipase: 29 U/L (ref 11–51)

## 2017-12-02 LAB — URINALYSIS, ROUTINE W REFLEX MICROSCOPIC
Bacteria, UA: NONE SEEN
Bilirubin Urine: NEGATIVE
Glucose, UA: NEGATIVE mg/dL
Hgb urine dipstick: NEGATIVE
KETONES UR: NEGATIVE mg/dL
Leukocytes, UA: NEGATIVE
Nitrite: NEGATIVE
PROTEIN: 30 mg/dL — AB
Specific Gravity, Urine: 1.016 (ref 1.005–1.030)
pH: 5 (ref 5.0–8.0)

## 2017-12-02 LAB — COMPREHENSIVE METABOLIC PANEL
ALBUMIN: 3.1 g/dL — AB (ref 3.5–5.0)
ALK PHOS: 97 U/L (ref 38–126)
ALT: 21 U/L (ref 0–44)
ANION GAP: 10 (ref 5–15)
AST: 41 U/L (ref 15–41)
BILIRUBIN TOTAL: 1 mg/dL (ref 0.3–1.2)
BUN: 30 mg/dL — AB (ref 8–23)
CALCIUM: 8.2 mg/dL — AB (ref 8.9–10.3)
CO2: 22 mmol/L (ref 22–32)
Chloride: 102 mmol/L (ref 98–111)
Creatinine, Ser: 1.35 mg/dL — ABNORMAL HIGH (ref 0.61–1.24)
GFR calc Af Amer: 55 mL/min — ABNORMAL LOW (ref >60)
GFR calc non Af Amer: 47 mL/min — ABNORMAL LOW (ref >60)
GLUCOSE: 123 mg/dL — AB (ref 70–99)
Potassium: 4.2 mmol/L (ref 3.5–5.1)
SODIUM: 134 mmol/L — AB (ref 135–145)
TOTAL PROTEIN: 6.1 g/dL — AB (ref 6.5–8.1)

## 2017-12-02 LAB — CBC WITH DIFFERENTIAL/PLATELET
Basophils Absolute: 0 10*3/uL (ref 0.0–0.1)
Basophils Relative: 1 %
EOS PCT: 0 %
Eosinophils Absolute: 0 10*3/uL (ref 0.0–0.7)
HCT: 30.6 % — ABNORMAL LOW (ref 39.0–52.0)
HEMOGLOBIN: 9.9 g/dL — AB (ref 13.0–17.0)
LYMPHS ABS: 0.7 10*3/uL (ref 0.7–4.0)
LYMPHS PCT: 40 %
MCH: 24.5 pg — AB (ref 26.0–34.0)
MCHC: 32.4 g/dL (ref 30.0–36.0)
MCV: 75.7 fL — AB (ref 78.0–100.0)
Monocytes Absolute: 0.4 10*3/uL (ref 0.1–1.0)
Monocytes Relative: 22 %
Neutro Abs: 0.7 10*3/uL — ABNORMAL LOW (ref 1.7–7.7)
Neutrophils Relative %: 37 %
PLATELETS: 113 10*3/uL — AB (ref 150–400)
RBC: 4.04 MIL/uL — AB (ref 4.22–5.81)
RDW: 19.1 % — ABNORMAL HIGH (ref 11.5–15.5)
WBC: 1.7 10*3/uL — AB (ref 4.0–10.5)

## 2017-12-02 LAB — HEMOGLOBIN A1C
Hgb A1c MFr Bld: 7.4 % — ABNORMAL HIGH (ref 4.8–5.6)
MEAN PLASMA GLUCOSE: 165.68 mg/dL

## 2017-12-02 LAB — GLUCOSE, CAPILLARY
Glucose-Capillary: 218 mg/dL — ABNORMAL HIGH (ref 70–99)
Glucose-Capillary: 425 mg/dL — ABNORMAL HIGH (ref 70–99)

## 2017-12-02 LAB — TROPONIN I
Troponin I: <0.03 ng/mL (ref <0.03)
Troponin I: <0.03 ng/mL (ref <0.03)
Troponin I: <0.03 ng/mL (ref <0.03)

## 2017-12-02 LAB — TSH: TSH: 1.559 u[IU]/mL (ref 0.350–4.500)

## 2017-12-02 LAB — BRAIN NATRIURETIC PEPTIDE: B Natriuretic Peptide: 180 pg/mL — ABNORMAL HIGH (ref 0.0–100.0)

## 2017-12-02 MED ORDER — INSULIN GLARGINE 100 UNIT/ML ~~LOC~~ SOLN
30.0000 [IU] | Freq: Every day | SUBCUTANEOUS | Status: DC
  Administered 2017-12-02 – 2017-12-04 (×3): 30 [IU] via SUBCUTANEOUS
  Filled 2017-12-02 (×4): qty 0.3

## 2017-12-02 MED ORDER — SODIUM CHLORIDE 0.9% FLUSH
3.0000 mL | Freq: Two times a day (BID) | INTRAVENOUS | Status: DC
  Administered 2017-12-02 – 2017-12-05 (×5): 3 mL via INTRAVENOUS

## 2017-12-02 MED ORDER — INSULIN ASPART 100 UNIT/ML ~~LOC~~ SOLN
4.0000 [IU] | Freq: Three times a day (TID) | SUBCUTANEOUS | Status: DC
  Administered 2017-12-03 – 2017-12-05 (×8): 4 [IU] via SUBCUTANEOUS

## 2017-12-02 MED ORDER — SODIUM CHLORIDE 0.9 % IV SOLN
INTRAVENOUS | Status: DC
  Administered 2017-12-02: 14:00:00 via INTRAVENOUS

## 2017-12-02 MED ORDER — MECLIZINE HCL 25 MG PO TABS: 12.5000 mg | ORAL_TABLET | Freq: Three times a day (TID) | ORAL | Status: DC | PRN

## 2017-12-02 MED ORDER — AMLODIPINE BESYLATE 10 MG PO TABS
10.0000 mg | ORAL_TABLET | Freq: Every day | ORAL | Status: DC
  Administered 2017-12-02 – 2017-12-04 (×3): 10 mg via ORAL
  Filled 2017-12-02 (×3): qty 1

## 2017-12-02 MED ORDER — METHYLPREDNISOLONE SODIUM SUCC 125 MG IJ SOLR
125.0000 mg | Freq: Once | INTRAMUSCULAR | Status: AC
  Administered 2017-12-02: 125 mg via INTRAVENOUS
  Filled 2017-12-02: qty 2

## 2017-12-02 MED ORDER — NITROGLYCERIN 0.4 MG SL SUBL
0.4000 mg | SUBLINGUAL_TABLET | SUBLINGUAL | Status: DC | PRN
  Administered 2017-12-03: 0.4 mg via SUBLINGUAL
  Filled 2017-12-02: qty 1

## 2017-12-02 MED ORDER — PANTOPRAZOLE SODIUM 40 MG PO TBEC
40.0000 mg | DELAYED_RELEASE_TABLET | Freq: Every day | ORAL | Status: DC
  Administered 2017-12-02 – 2017-12-05 (×4): 40 mg via ORAL
  Filled 2017-12-02 (×4): qty 1

## 2017-12-02 MED ORDER — SODIUM CHLORIDE 0.9 % IV SOLN
INTRAVENOUS | Status: DC
  Administered 2017-12-02 – 2017-12-03 (×2): via INTRAVENOUS

## 2017-12-02 MED ORDER — ACETAMINOPHEN 325 MG PO TABS
650.0000 mg | ORAL_TABLET | Freq: Four times a day (QID) | ORAL | Status: DC | PRN
  Administered 2017-12-02 – 2017-12-03 (×2): 650 mg via ORAL
  Filled 2017-12-02 (×2): qty 2

## 2017-12-02 MED ORDER — SODIUM CHLORIDE 0.9 % IV BOLUS
250.0000 mL | Freq: Once | INTRAVENOUS | Status: AC
  Administered 2017-12-02: 250 mL via INTRAVENOUS

## 2017-12-02 MED ORDER — ONDANSETRON HCL 4 MG PO TABS: 4.0000 mg | ORAL_TABLET | Freq: Four times a day (QID) | ORAL | Status: DC | PRN

## 2017-12-02 MED ORDER — TRAZODONE HCL 100 MG PO TABS
100.0000 mg | ORAL_TABLET | Freq: Every day | ORAL | Status: DC
  Administered 2017-12-02 – 2017-12-04 (×3): 100 mg via ORAL
  Filled 2017-12-02 (×3): qty 1

## 2017-12-02 MED ORDER — INSULIN ASPART 100 UNIT/ML ~~LOC~~ SOLN
0.0000 [IU] | Freq: Three times a day (TID) | SUBCUTANEOUS | Status: DC
  Administered 2017-12-03: 3 [IU] via SUBCUTANEOUS
  Administered 2017-12-03: 8 [IU] via SUBCUTANEOUS

## 2017-12-02 MED ORDER — INSULIN GLARGINE 100 UNIT/ML ~~LOC~~ SOLN
40.0000 [IU] | Freq: Every day | SUBCUTANEOUS | Status: DC
  Administered 2017-12-03 – 2017-12-04 (×2): 40 [IU] via SUBCUTANEOUS
  Filled 2017-12-02 (×3): qty 0.4

## 2017-12-02 MED ORDER — SODIUM CHLORIDE 0.9 % IV SOLN: 250.0000 mL | INTRAVENOUS | Status: DC | PRN

## 2017-12-02 MED ORDER — ACETAMINOPHEN 650 MG RE SUPP: 650.0000 mg | Freq: Four times a day (QID) | RECTAL | Status: DC | PRN

## 2017-12-02 MED ORDER — SODIUM CHLORIDE 0.9% FLUSH: 3.0000 mL | INTRAVENOUS | Status: DC | PRN

## 2017-12-02 MED ORDER — ATORVASTATIN CALCIUM 80 MG PO TABS
80.0000 mg | ORAL_TABLET | Freq: Every evening | ORAL | Status: DC
  Administered 2017-12-02 – 2017-12-04 (×3): 80 mg via ORAL
  Filled 2017-12-02 (×3): qty 1

## 2017-12-02 MED ORDER — SENNOSIDES-DOCUSATE SODIUM 8.6-50 MG PO TABS: 1.0000 | ORAL_TABLET | Freq: Every evening | ORAL | Status: DC | PRN

## 2017-12-02 MED ORDER — IPRATROPIUM-ALBUTEROL 0.5-2.5 (3) MG/3ML IN SOLN
3.0000 mL | Freq: Once | RESPIRATORY_TRACT | Status: AC
  Administered 2017-12-02: 3 mL via RESPIRATORY_TRACT
  Filled 2017-12-02: qty 3

## 2017-12-02 MED ORDER — ONDANSETRON HCL 4 MG/2ML IJ SOLN: 4.0000 mg | Freq: Four times a day (QID) | INTRAMUSCULAR | Status: DC | PRN

## 2017-12-02 MED ORDER — FINASTERIDE 5 MG PO TABS
5.0000 mg | ORAL_TABLET | Freq: Every day | ORAL | Status: DC
  Administered 2017-12-02 – 2017-12-05 (×4): 5 mg via ORAL
  Filled 2017-12-02 (×4): qty 1

## 2017-12-02 MED ORDER — FUROSEMIDE 20 MG PO TABS
20.0000 mg | ORAL_TABLET | Freq: Every day | ORAL | Status: DC
  Administered 2017-12-02 – 2017-12-04 (×3): 20 mg via ORAL
  Filled 2017-12-02 (×3): qty 1

## 2017-12-02 MED ORDER — ALBUTEROL SULFATE (2.5 MG/3ML) 0.083% IN NEBU
3.0000 mL | INHALATION_SOLUTION | RESPIRATORY_TRACT | Status: DC | PRN
  Administered 2017-12-03: 3 mL via RESPIRATORY_TRACT
  Filled 2017-12-02: qty 3

## 2017-12-02 MED ORDER — METOPROLOL SUCCINATE ER 100 MG PO TB24
100.0000 mg | ORAL_TABLET | Freq: Every day | ORAL | Status: DC
  Administered 2017-12-02 – 2017-12-04 (×3): 100 mg via ORAL
  Filled 2017-12-02 (×3): qty 1

## 2017-12-02 MED ORDER — IOPAMIDOL (ISOVUE-370) INJECTION 76%
100.0000 mL | Freq: Once | INTRAVENOUS | Status: AC | PRN
  Administered 2017-12-02: 100 mL via INTRAVENOUS

## 2017-12-02 MED ORDER — CLOPIDOGREL BISULFATE 75 MG PO TABS
75.0000 mg | ORAL_TABLET | Freq: Every day | ORAL | Status: DC
  Administered 2017-12-02 – 2017-12-04 (×3): 75 mg via ORAL
  Filled 2017-12-02 (×4): qty 1

## 2017-12-02 MED ORDER — INSULIN ASPART 100 UNIT/ML ~~LOC~~ SOLN
0.0000 [IU] | Freq: Every day | SUBCUTANEOUS | Status: DC
  Administered 2017-12-02: 5 [IU] via SUBCUTANEOUS

## 2017-12-02 NOTE — ED Provider Notes (Signed)
[facility] EMERGENCY DEPARTMENT Provider Note   CSN: [id number] Arrival date & time: 12/02/17  5784     History   Chief Complaint Chief Complaint  Patient presents with  . Shortness of Breath    HPI Mike Wright is a 84 y.o. male.  HPI  Pt was seen at 0935. Per pt, c/o gradual onset and worsening of persistent SOB for the past 2 days, worse since overnight last night. Pt states he was "up all night" because he became more SOB when he laid flat. Pt states he slept in a chair with some improvement. Pt also states he fell 2 weeks ago, had right sided CP which "went away then came back" 4 days ago. Pt endorses hx of frequent falls "because of my toes amputations" and walks with cane/walker at baseline.  Pt states he also has had "a cough for 6 months" but has not told any of his medical providers about it. States his sputum is "clear." Denies having home O2.  Denies palpitations, no back pain, no neck pain, no abd pain, no N/V/D, no focal motor weakness, no fevers, no rash, no syncope/near syncope.     Past Medical History:  Diagnosis Date  . Anemia    a. mild, noted 04/2017.  [name] CAD in native artery    a. [name] 04/2017 s/p DES to D1, DES to prox-mid LAD, DES to prox LAD overlapping the prior stent, LVEF 55-65%.   . Chronic diastolic CHF (congestive heart failure) ([place])   . Diabetic ulcer of toe ([place])   . DJD (degenerative joint disease) of cervical spine   . Essential hypertension, benign   . GERD (gastroesophageal reflux disease)   . Heart murmur   . History of hiatal hernia   . HIT (heparin-induced thrombocytopenia) ([place])   . Hypothyroidism   . Hypoxia    a. went home on home O2 04/2017.  [name] Insomnia   . Mixed hyperlipidemia   . PAD (peripheral artery disease) ([place])   . PAF (paroxysmal atrial fibrillation) ([place])   . PSVT (paroxysmal supraventricular tachycardia) ([place])   . PVD (peripheral vascular disease) ([place])   . Renal insufficiency   . Retinal hemorrhage    lost 90% of  vision.  . Sinus bradycardia    a. HR 30s-40s in 04/2017 -> diltiazem stopped, metoprolol reduced.  . Sleep apnea    "chose not to order CPAP at this time" (05/18/2017)  . Type 2 diabetes mellitus ([place])   . Wheezing    a. suspected COPD 04/2017. Former tobacco x 40 years.    Patient Active Problem List   Diagnosis Date Noted  . Coronary artery disease involving native coronary artery of native heart with angina pectoris ([place]) 11/20/2017  . Abdominal pain   . Nausea without vomiting   . Acute on chronic anemia 11/18/2017  . Volume overload 11/18/2017  . Chest pain 11/17/2017  . Gangrene ([name])   . Urinary retention 09/16/2017  . MRSA bacteremia 09/15/2017  . Gangrene of bilateral great toes ([place]) 09/13/2017  . COPD (chronic obstructive pulmonary disease) ([place]) 05/19/2017  . Iron deficiency anemia due to chronic blood loss   . CAD S/P percutaneous coronary angioplasty   . Chronic diastolic CHF (congestive heart failure) ([place])   . PSVT (paroxysmal supraventricular tachycardia) ([place])   . PAF (paroxysmal atrial fibrillation) ([place])   . Sleep apnea   . Type 2 diabetes mellitus ([place])   . Status post coronary artery stent placement   . Essential  hypertension 04/15/2010  . Diabetes ([place]) 04/14/2010    Past Surgical History:  Procedure Laterality Date  . ABDOMINAL AORTOGRAM W/LOWER EXTREMITY N/A 09/15/2017   Procedure: ABDOMINAL AORTOGRAM W/LOWER EXTREMITY;  Surgeon: Serafina Mitchell, MD;  Location: [place] [facility];  Service: Cardiovascular;  Laterality: N/A;  . AMPUTATION Bilateral 09/20/2017   Procedure: BILATERAL GREAT TOE AMPUTATIONS INCLUDING METATARSAL HEADS;  Surgeon: Serafina Mitchell, MD;  Location: [facility];  Service: Vascular;  Laterality: Bilateral;  . ANGIOPLASTY Right 09/20/2017   Procedure: ANGIOPLASTY RIGHT TIBIAL ARTERY;  Surgeon: Serafina Mitchell, MD;  Location: [place];  Service: Vascular;  Laterality: Right;  . APPENDECTOMY    . BACK SURGERY    . CARDIAC CATHETERIZATION   1980s; 2012;  . CATARACT EXTRACTION Left 2004  . COLONOSCOPY  2004   FLEISHMAN TICS  . CORONARY ANGIOPLASTY WITH STENT PLACEMENT  05/18/2017   "3 stents"  . CORONARY STENT INTERVENTION N/A 05/18/2017   Procedure: CORONARY STENT INTERVENTION;  Surgeon: Jettie Booze, MD;  Location: [place] [facility];  Service: Cardiovascular;  Laterality: N/A;  . ESOPHAGOGASTRODUODENOSCOPY (EGD) WITH PROPOFOL N/A 11/20/2017   Procedure: ESOPHAGOGASTRODUODENOSCOPY (EGD) WITH PROPOFOL;  Surgeon: Irene Shipper, MD;  Location: [place];  Service: Gastroenterology;  Laterality: N/A;  . JOINT REPLACEMENT    . LEFT HEART CATH AND CORONARY ANGIOGRAPHY N/A 05/18/2017   Procedure: LEFT HEART CATH AND CORONARY ANGIOGRAPHY;  Surgeon: Jettie Booze, MD;  Location: [place] [facility];  Service: Cardiovascular;  Laterality: N/A;  . LOWER EXTREMITY ANGIOGRAM Right 09/20/2017   Procedure: RIGHT LOWER LEG  ANGIOGRAM;  Surgeon: Serafina Mitchell, MD;  Location: [place];  Service: Vascular;  Laterality: Right;  . LUMBAR FUSION  2002   L3, 4 L4, 5 L5 S1 Fused by Dr. Glenna Fellows  . PERIPHERAL VASCULAR BALLOON ANGIOPLASTY Left 09/15/2017   Procedure: PERIPHERAL VASCULAR BALLOON ANGIOPLASTY;  Surgeon: Serafina Mitchell, MD;  Location: [place] [facility];  Service: Cardiovascular;  Laterality: Left;  PTA of Peroneal & Posterior Tibial  . POSTERIOR LUMBAR FUSION    . RHINOPLASTY    . RIGHT HEART CATH N/A 05/18/2017   Procedure: RIGHT HEART CATH;  Surgeon: Jettie Booze, MD;  Location: [place] [facility];  Service: Cardiovascular;  Laterality: N/A;  . TEE WITHOUT CARDIOVERSION N/A 09/18/2017   Procedure: TRANSESOPHAGEAL ECHOCARDIOGRAM (TEE);  Surgeon: Fay Records, MD;  Location: [facility];  Service: Cardiovascular;  Laterality: N/A;  . TOTAL KNEE ARTHROPLASTY Bilateral   . TRANSURETHRAL RESECTION OF [place] Medications    Prior to Admission medications   Medication Sig Start  Date End Date Taking? Authorizing Provider  acetaminophen (TYLENOL) 500 MG tablet Take 500 mg by mouth as needed for mild pain or headache.    [provider]  albuterol (PROVENTIL) (2.5 MG/3ML) 0.083% nebulizer solution Inhale 3 mLs into the lungs every 4 (four) hours as needed for wheezing or shortness of breath. 05/20/17   Daune Perch, NP  amLODipine (NORVASC) 10 MG tablet Take 1 tablet (10 mg total) by mouth daily. 06/05/17   Arnoldo Lenis, MD  apixaban (ELIQUIS) 5 MG TABS tablet Take 1 tablet (5 mg total) by mouth 2 (two) times daily. 11/23/15   Arnoldo Lenis, MD  atorvastatin (LIPITOR) 80 MG tablet Take 1 tablet (80 mg total) by mouth every evening. 05/19/17   Charlie Pitter, PA-C  Cholecalciferol (D3-1000 PO)  Take 1,000 Units by mouth daily.     [provider]  clopidogrel (PLAVIX) 75 MG tablet Take 1 tablet (75 mg total) by mouth daily. 05/19/17   Dunn, Nedra Hai, PA-C  collagenase (SANTYL) ointment Apply 1 application topically daily. 11/13/17   Serafina Mitchell, MD  Cyanocobalamin (B-12 PO) Take 1 tablet by mouth daily.     [provider]  ferrous sulfate 325 (65 FE) MG tablet Take 1 tablet (325 mg total) by mouth 2 (two) times daily with a meal. 11/22/17   Cheryln Manly, NP  finasteride (PROSCAR) 5 MG tablet Take 5 mg by mouth daily.  04/17/15   [provider]  furosemide (LASIX) 20 MG tablet TAKE 1 TABLET DAILY AS NEEDED Patient taking differently: Take 20 mg by mouth daily.  08/15/17   Arnoldo Lenis, MD  insulin glargine (LANTUS) 100 UNIT/ML injection Inject 30-40 Units into the skin See admin instructions. Inject 40 units SQ in the morning and inject 30 units SQ at bedtime    [provider]  meclizine (ANTIVERT) 12.5 MG tablet Take 1 tablet (12.5 mg total) by mouth 3 (three) times daily as needed for dizziness. 05/30/17   Richardson Dopp T, PA-C  metFORMIN (GLUCOPHAGE) 1000 MG tablet Take 1,000 mg by mouth 2 (two) times daily.  05/22/17   [provider]  metoprolol succinate (TOPROL-XL) 100 MG 24 hr tablet TAKE 1/2 TABLET EVERY DAY Patient taking differently: Take 100 mg by mouth daily.  05/24/17   Arnoldo Lenis, MD  nitroGLYCERIN (NITROSTAT) 0.4 MG SL tablet PLACE 1 TABLET (0.4 MG TOTAL) UNDER THE TONGUE EVERY 5  MINUTES AS NEEDED FOR CHEST PAIN. Patient taking differently: Place 0.4 mg under the tongue every 5 (five) minutes as needed.  05/22/17   Arnoldo Lenis, MD  pantoprazole (PROTONIX) 40 MG tablet Take 1 tablet (40 mg total) by mouth daily. To protect stomach while taking multiple blood thinners. 11/22/17   Cheryln Manly, NP  psyllium (METAMUCIL) 58.6 % packet Take 1 packet by mouth daily.    [provider]  sulfamethoxazole-trimethoprim (BACTRIM,SEPTRA) 400-80 MG tablet Take 1 tablet by mouth 2 (two) times daily. 11/13/17   Serafina Mitchell, MD  traZODone (DESYREL) 100 MG tablet Take 100 mg by mouth at bedtime.  03/25/16   [provider]    Family History Family History  Problem Relation Age of Onset  . Heart attack Father 70  . COPD Father   . COPD Mother   . Heart disease Mother   . Diabetes Mother   . Hypertension Sister   . CVA Sister   . Diabetes Sister   . Multiple sclerosis Sister     Social History Social History   Tobacco Use  . Smoking status: Former Smoker    Packs/day: 2.00    Years: 30.00    Pack years: 60.00    Types: Cigarettes    Start date: 03/28/1953    Last attempt to quit: 03/29/1983    Years since quitting: 34.7  . Smokeless tobacco: Never Used  Substance Use Topics  . Alcohol use: Not Currently    Comment: 05/18/2017 'I drink a beer q yr"  . Drug use: No     Allergies   Codeine; Heparin; Losartan; Other; and Oxycodone   Review of Systems Review of Systems ROS: Statement: All systems negative except as marked or noted in the HPI; Constitutional: Negative for fever and chills. ; ; Eyes: Negative for eye  pain, redness and  discharge. ; ; ENMT: Negative for ear pain, hoarseness, nasal congestion, sinus pressure and sore throat. ; ; Cardiovascular: Negative for palpitations, diaphoresis, and peripheral edema. ; ; Respiratory: +cough, SOB. Negative for wheezing and stridor. ; ; Gastrointestinal: Negative for nausea, vomiting, diarrhea, abdominal pain, blood in stool, hematemesis, jaundice and rectal bleeding. . ; ; Genitourinary: Negative for dysuria, flank pain and hematuria. ; ; Musculoskeletal: +right chest wall pain. Negative for back pain and neck pain. Negative for swelling and deformity..; ; Skin: Negative for pruritus, rash, abrasions, blisters, bruising and skin lesion.; ; Neuro: Negative for headache, lightheadedness and neck stiffness. Negative for weakness, altered level of consciousness, altered mental status, extremity weakness, paresthesias, involuntary movement, seizure and syncope.       Physical Exam Updated Vital Signs BP 110/68 (BP Location: Left Arm)   Pulse 90   Temp 100 F (37.8 C) (Oral)   Resp (!) 22   Ht 5\' 7"  (1.702 m)   Wt 93 kg   SpO2 90%   BMI 32.11 kg/m   Patient Vitals for the past 24 hrs:  BP Temp Temp src Pulse Resp SpO2 Height Weight  12/02/17 1500 105/77 - - 93 20 96 % - -  12/02/17 1430 120/66 - - 91 (!) 25 94 % - -  12/02/17 1400 108/62 - - 85 (!) 23 99 % - -  12/02/17 1330 118/70 - - 85 - 97 % - -  12/02/17 1214 98/67 - - 85 (!) 30 93 % - -  12/02/17 1200 98/67 - - 83 19 100 % - -  12/02/17 1158 - - - - - 90 % - -  12/02/17 1131 - - - - - 93 % - -  12/02/17 1130 113/65 - - 87 (!) 23 93 % - -  12/02/17 1100 106/60 - - 85 - 92 % - -  12/02/17 1045 - 98.6 F (37 C) Oral - - 98 % - -  12/02/17 1030 (!) 103/52 - - 85 - 98 % - -  12/02/17 0929 110/68 100 F (37.8 C) Oral 90 (!) 22 90 % - -  12/02/17 0927 - - - - - - 5\' 7"  (1.702 m) 93 kg   13:22:39 Orthostatic Vital Signs RG  Orthostatic Lying   BP- Lying: 108/87   Pulse- Lying: 87       Orthostatic Sitting  BP-  Sitting: 99/67   Pulse- Sitting: 90       Orthostatic Standing at 0 minutes  BP- Standing at 0 minutes: 119/61   Pulse- Standing at 0 minutes: 92      Physical Exam 0940: Physical examination:  Nursing notes reviewed; Vital signs and O2 SAT reviewed;  Constitutional: Well developed, Well nourished, Well hydrated, In no acute distress; Head:  Normocephalic, atraumatic; Eyes: EOMI, PERRL, No scleral icterus; ENMT: Mouth and pharynx normal, Mucous membranes moist; Neck: Supple, Full range of motion, No lymphadenopathy; Cardiovascular: Regular rate and rhythm, No gallop; Respiratory: Breath sounds diminished & equal bilaterally, No wheezes.  Speaking full sentences with ease, Normal respiratory effort/excursion. Mild tachypnea at times.; Chest: +right anterior-lateral chest wall tender to palp. No deformity. Movement normal; Abdomen: Soft, Nontender, Nondistended, Normal bowel sounds; Genitourinary: No CVA tenderness; Extremities: Peripheral pulses normal, No tenderness, No edema, No calf edema or asymmetry.; Neuro: AA&Ox3, Major CN grossly intact.  Speech clear. No gross focal motor deficits in extremities.; Skin: Color normal, Warm, Dry.   ED Treatments / Results  Labs (  all labs ordered are listed, but only abnormal results are displayed)   EKG EKG Interpretation    EKG Interpretation  Date/Time:  Saturday December 02 2017 09:27:20 EDT Ventricular Rate:  89 PR Interval:    QRS Duration: 90 QT Interval:  384 QTC Calculation: 468 R Axis:   8 Text Interpretation:  Sinus or ectopic atrial rhythm RSR' in V1 or V2, probably normal variant Borderline ST elevation, anterior leads When compared with ECG of 11/17/2017 (Cards MD office EKG) First degree A-V block is no longer Present Otherwise no significant change Reconfirmed by Francine Graven [phone]) on 12/02/2017 2:45:59 PM        EKG Interpretation  Date/Time:  Saturday December 02 2017 14:02:56 EDT Ventricular Rate:  86 PR  Interval:    QRS Duration: 97 QT Interval:  372 QTC Calculation: 445 R Axis:   11 Text Interpretation:  Atrial flutter with predominant 3:1 AV block Borderline repolarization abnormality When compared with ECG of 11/17/2017 (MUSE) No significant change was found Since last tracing of earlier today Atrial flutter has replaced sinus or ectopic atrial rhythm Confirmed by Francine Graven [phone]) on 12/02/2017 2:49:45 PM        Radiology   Procedures Procedures (including critical care time)  Medications Ordered in ED Medications - No data to display   Initial Impression / Assessment and Plan / ED Course  I have reviewed the triage vital signs and the nursing notes.  Pertinent labs & imaging results that were available during my care of the patient were reviewed by me and considered in my medical decision making (see chart for details).  MDM Reviewed: previous chart, nursing note and vitals Reviewed previous: labs and ECG Interpretation: labs, ECG and x-ray    Results for orders placed or performed during the hospital encounter of 12/02/17  Culture, blood (routine x 2)  Result Value Ref Range   Specimen Description RIGHT ANTECUBITAL    Special Requests      BOTTLES DRAWN AEROBIC AND ANAEROBIC Blood Culture adequate volume Performed at [facility], [address] [place], [place] [address]    Culture PENDING    Report Status PENDING   Culture, blood (routine x 2)  Result Value Ref Range   Specimen Description BLOOD RIGHT HAND    Special Requests      BOTTLES DRAWN AEROBIC AND ANAEROBIC Blood Culture adequate volume Performed at [facility], [address], [place], [place] [address]    Culture PENDING    Report Status PENDING   Comprehensive metabolic panel  Result Value Ref Range   Sodium 134 (L) 135 - 145 mmol/L   Potassium 4.2 3.5 - 5.1 mmol/L   Chloride 102 98 - 111 mmol/L   CO2 22 22 - 32 mmol/L   Glucose, Bld 123 (H) 70 - 99 mg/dL   BUN 30 (H) 8 - 23 mg/dL     Creatinine, Ser 1.35 (H) 0.61 - 1.24 mg/dL   Calcium 8.2 (L) 8.9 - 10.3 mg/dL   Total Protein 6.1 (L) 6.5 - 8.1 g/dL   Albumin 3.1 (L) 3.5 - 5.0 g/dL   AST 41 15 - 41 U/L   ALT 21 0 - 44 U/L   Alkaline Phosphatase 97 38 - 126 U/L   Total Bilirubin 1.0 0.3 - 1.2 mg/dL   GFR calc non Af Amer 47 (L) >60 mL/min   GFR calc Af Amer 55 (L) >60 mL/min   Anion gap 10 5 - 15  Lipase, blood  Result  Value Ref Range   Lipase 29 11 - 51 U/L  Brain natriuretic peptide  Result Value Ref Range   B Natriuretic Peptide 180.0 (H) 0.0 - 100.0 pg/mL  Troponin I  Result Value Ref Range   Troponin I <0.03 <0.03 ng/mL  CBC with Differential  Result Value Ref Range   WBC 1.7 (L) 4.0 - 10.5 K/uL   RBC 4.04 (L) 4.22 - 5.81 MIL/uL   Hemoglobin 9.9 (L) 13.0 - 17.0 g/dL   HCT 30.6 (L) 39.0 - 52.0 %   MCV 75.7 (L) 78.0 - 100.0 fL   MCH 24.5 (L) 26.0 - 34.0 pg   MCHC 32.4 30.0 - 36.0 g/dL   RDW 19.1 (H) 11.5 - 15.5 %   Platelets 113 (L) 150 - 400 K/uL   Neutrophils Relative % 37 %   Neutro Abs 0.7 (L) 1.7 - 7.7 K/uL   Lymphocytes Relative 40 %   Lymphs Abs 0.7 0.7 - 4.0 K/uL   Monocytes Relative 22 %   Monocytes Absolute 0.4 0.1 - 1.0 K/uL   Eosinophils Relative 0 %   Eosinophils Absolute 0.0 0.0 - 0.7 K/uL   Basophils Relative 1 %   Basophils Absolute 0.0 0.0 - 0.1 K/uL  Urinalysis, Routine w reflex microscopic  Result Value Ref Range   Color, Urine YELLOW YELLOW   APPearance CLEAR CLEAR   Specific Gravity, Urine 1.016 1.005 - 1.030   pH 5.0 5.0 - 8.0   Glucose, UA NEGATIVE NEGATIVE mg/dL   Hgb urine dipstick NEGATIVE NEGATIVE   Bilirubin Urine NEGATIVE NEGATIVE   Ketones, ur NEGATIVE NEGATIVE mg/dL   Protein, ur 30 (A) NEGATIVE mg/dL   Nitrite NEGATIVE NEGATIVE   Leukocytes, UA NEGATIVE NEGATIVE   RBC / HPF 0-5 0 - 5 RBC/hpf   WBC, UA 0-5 0 - 5 WBC/hpf   Bacteria, UA NONE SEEN NONE SEEN   Squamous Epithelial / LPF 0-5 0 - 5   Mucus PRESENT   Troponin I  Result Value Ref Range    Troponin I <0.03 <0.03 ng/mL    Dg Ribs Unilateral W/chest Right Result Date: 12/02/2017 CLINICAL DATA:  Progressive cough. Shortness of breath. Right-sided chest pain. Fall 2 weeks ago with right anterior rib pain. EXAM: RIGHT RIBS AND CHEST - 3+ VIEW COMPARISON:  11/17/2017 FINDINGS: Bibasilar subsegmental atelectasis. This is improved at the right lung base compared 11/17/2017. No appreciable pneumothorax or pulmonary contusion. Probable nondisplaced fracture the right anterior seventh rib. Questionable nondisplaced right anterior eighth rib fracture. No displaced rib fracture is identified. Tortuous thoracic aorta. Heart size within normal limits for projection. IMPRESSION: 1. Subtle possible nondisplaced fractures of the right anterior seventh and eighth ribs. No displaced fracture identified. 2. Bibasilar subsegmental atelectasis, improved at the right lung base compared 11/17/2017. Electronically Signed   By: Van Clines M.D.   On: 12/02/2017 11:00   Ct Angio Chest Pe W/cm &/or Wo Cm Addendum Date: 12/02/2017   ADDENDUM REPORT: 12/02/2017 13:52 ADDENDUM: Addendum created after further review and discussion with the referring physician. There is nondisplaced rib fracture of the anterior right fourth rib, at the chondral junction. No sternal fracture or left rib fracture is identified. No rib fracture of the rib angles at the seventh and eighth ribs identified, however, a portion of the data set is not included on the axial images given the patient's body habitus. These results were discussed by telephone at the time of interpretation on 12/02/2017 at 1:52 pm with Dr. Nunzio Cory [facility] Electronically  Signed   By: Corrie Mckusick D.O.   On: 12/02/2017 13:52   Result Date: 12/02/2017 CLINICAL DATA:  84 year old male with a history of cough EXAM: CT ANGIOGRAPHY CHEST WITH CONTRAST TECHNIQUE: Multidetector CT imaging of the chest was performed using the standard protocol during bolus administration of  intravenous contrast. Multiplanar CT image reconstructions and MIPs were obtained to evaluate the vascular anatomy. CONTRAST:  189mL ISOVUE-370 IOPAMIDOL (ISOVUE-370) INJECTION 76% COMPARISON:  CT angio chest 03/24/2010 FINDINGS: Cardiovascular: Heart: Heart size enlarged, unchanged from comparison chest x-ray studies, and new from the CT dated 2011. Dense calcifications/stenting of the left anterior descending coronary artery. Calcifications of the circumflex and right coronary arteries. Minimal calcifications of the mitral annulus and aortic valve. No pericardial fluid/thickening. Aorta: Diameter of the ascending aorta estimated 3.4 cm beyond the sino-tubular junction. Minimal calcifications of the aortic arch. Branch vessels are patent. Three vessel arch. Cervical vessels are patent at the neck base. Mild calcifications of the descending aorta. Pulmonary arteries: No central, lobar, segmental, or proximal subsegmental filling defects. Mediastinum/Nodes: Small lymph nodes of the mediastinum. The largest measures approximately 6 mm in the lowest right paratracheal nodal station. Unremarkable course of the thoracic esophagus. Unremarkable thoracic inlet. Lungs/Pleura: Lungs demonstrate diffuse geographic ground-glass, with more confluent regions of hazy opacity in the dependent regions along the fissures and at the posterior lung bases. No pleural effusion. No tree-in-bud opacity. Centrilobular emphysema with developing paraseptal emphysema at the apices. No endotracheal or endobronchial debris. No significant bronchial wall thickening. No interlobular septal thickening. No pneumothorax. Upper Abdomen: No acute. Musculoskeletal: Degenerative changes of the spine. No acute bony abnormality. Review of the MIP images confirms the above findings. . IMPRESSION: Negative for pulmonary emboli. Geographic ground-glass opacity of the lungs is most suggestive of either chronic small airway disease or chronic small vessel  disease. Background of developing centrilobular and paraseptal emphysema. Emphysema (ICD10-J43.9). Since the prior CT of 2011, there has been development of cardiomegaly. Native coronary artery disease, with stenting/calcification of the left anterior descending, as well as disease involving circumflex and right coronary arteries. Associated aortic atherosclerosis. Aortic Atherosclerosis (ICD10-I70.0). Electronically Signed: By: Corrie Mckusick D.O. On: 12/02/2017 13:22    1400:  Pt's BP labile. New pancytopenia on CBC. No fever in ED. BC and UC obtained however. CT-A negative for PE, though +emphysematous changes and rib fx.  Pt ambulated with Sats dropping to 90's and pt c/o increasing SOB. Sats on R/A at rest will drop into high 80's. O2 2L N/C applied with O2 Sats increasing to 97-99%. Short nebs and IV solumedrol given. Troponin negative x2 and pt denies CP. EKG initially NSR, but now aflutter (pt has hx of same). Concern regarding restarting eliquis after last month's admission for anemia with transfusion. T/C returned from [facility] Dr. Jerilee Hoh, case discussed, including:  HPI, pertinent PM/SHx, VS/PE, dx testing, ED course and treatment:  Agreeable to admit, requests call Cards at [facility].   1450:  T/C returned from [facility] Cards Dr. Stanford Breed, case discussed, including:  HPI, pertinent PM/SHx, VS/PE, dx testing, ED course and treatment:  No need to admit to cards service at this time, admit to [facility] and if they want Cards to consult they will.      Final Clinical Impressions(s) / ED Diagnoses   Final diagnoses:  None    ED Discharge Orders    None       Francine Graven, DO 12/05/17 [id number]

## 2017-12-02 NOTE — ED Triage Notes (Signed)
Pt reports cough progressing for the past few weeks with clear sputum.  Has become increasingly short of breath with some right sided chest pain upon coughing.

## 2017-12-02 NOTE — ED Notes (Signed)
Report given to [facility] with [place].

## 2017-12-02 NOTE — ED Notes (Signed)
Weight: 203.4 lbs

## 2017-12-02 NOTE — ED Notes (Signed)
Patient transported to CT 

## 2017-12-02 NOTE — H&P (Addendum)
History and Physical    Mike Wright UXN:[id number] DOB: 12/01/1933 DOA: 12/02/2017  Referring MD/NP/PA: Francine Graven, EDP PCP: Medicine, [facility]  Patient coming from: Home  Chief Complaint: Dyspnea  HPI: Mike Wright is a 84 y.o. male with multiple medical comorbidities, most significant for coronary artery disease status post LAD stenting x2 in February 2019 with recent hospitalization at the end of August for dyspnea and chest pain.  At that time it was thought that his chest pain was due to his anemia as pain improved after transfusion.  GI was involved at that time and decision was made to discontinue Eliquis despite his history of A. fib given his significant iron deficiency anemia and recent PCI and need for Plavix.  He states that he was in his usual state of health until about 5 PM yesterday afternoon.  At that time he had substernal chest pain that only resolved after his second dose of nitroglycerin.  He went to bed and woke up at around 10 PM with extreme shortness of breath, at that time denied chest pain, he also states that he felt very weak and unbalanced, partially because of his recent bilateral toe amputations.  Early this morning at around 6 when his wife went in to check on him, he was still short of breath, they called the home health agency and were instructed to call 911 to come to the ED for evaluation.  In the ED vital signs were normal, he was slightly hypoxic to 93% on room air and was placed on 2 L of oxygen with sats in the upper 90s.  Labs are significant for a sodium of 134, a creatinine of 1.35 which is above his baseline of 1-1.1, initial troponin was negative at less than 0.03, BNP was 180.  Interestingly he is pancytopenic with a WBC count of 1.7, platelet count of 113 and a hemoglobin of 9.9.  On 8/27 WBC count was 5.0, hemoglobin was 10.5 and platelet count was 243.  Chest x-ray shows subtle possible nondisplaced fractures of the right anterior seventh  and eighth ribs.  Subsegmental atelectasis which is bibasilar.  CT Angio of the chest is negative for PE, there is groundglass opacity of the lungs that is most suggestive of either chronic small airway disease or chronic small vessel disease.  There is also emphysema.  Patient has never been diagnosed with COPD.  EKG performed at 9:27 AM shows what appears to be some mild ST segment changes in the anterior leads, repeat EKG done at 2:02 PM shows normalization of ST segments with a flutter with 3 is to 1 block.  Upon further questioning the patient states that for about the past 7 to 8 days he has been feeling feverish although he has not actually taken his temperature, has had a URI symptoms with clear nasal discharge, postnasal drip and coughing.  He denies a known tick bite and has not been in the woods recently.  He does state that he fell about 5 days ago onto his right side but did not seek evaluation as he did not describe any pain then.  Admission has been requested for further evaluation and management.  Past Medical/Surgical History: Past Medical History:  Diagnosis Date  . Anemia    a. mild, noted 04/2017.  [name] CAD in native artery    a. [name] 04/2017 s/p DES to D1, DES to prox-mid LAD, DES to prox LAD overlapping the prior stent, LVEF 55-65%.   . Chronic  diastolic CHF (congestive heart failure) ([place])   . Diabetic ulcer of toe ([place])   . DJD (degenerative joint disease) of cervical spine   . Essential hypertension, benign   . GERD (gastroesophageal reflux disease)   . Heart murmur   . History of hiatal hernia   . HIT (heparin-induced thrombocytopenia) ([place])   . Hypothyroidism   . Hypoxia    a. went home on home O2 04/2017.  [name] Insomnia   . Mixed hyperlipidemia   . PAD (peripheral artery disease) ([place])   . PAF (paroxysmal atrial fibrillation) ([place])   . PSVT (paroxysmal supraventricular tachycardia) ([place])   . PVD (peripheral vascular disease) ([place])   . Renal insufficiency   . Retinal  hemorrhage    lost 90% of vision.  . Sinus bradycardia    a. HR 30s-40s in 04/2017 -> diltiazem stopped, metoprolol reduced.  . Sleep apnea    "chose not to order CPAP at this time" (05/18/2017)  . Type 2 diabetes mellitus ([place])   . Wheezing    a. suspected COPD 04/2017. Former tobacco x 40 years.    Past Surgical History:  Procedure Laterality Date  . ABDOMINAL AORTOGRAM W/LOWER EXTREMITY N/A 09/15/2017   Procedure: ABDOMINAL AORTOGRAM W/LOWER EXTREMITY;  Surgeon: Serafina Mitchell, MD;  Location: [place] [facility];  Service: Cardiovascular;  Laterality: N/A;  . AMPUTATION Bilateral 09/20/2017   Procedure: BILATERAL GREAT TOE AMPUTATIONS INCLUDING METATARSAL HEADS;  Surgeon: Serafina Mitchell, MD;  Location: [facility];  Service: Vascular;  Laterality: Bilateral;  . ANGIOPLASTY Right 09/20/2017   Procedure: ANGIOPLASTY RIGHT TIBIAL ARTERY;  Surgeon: Serafina Mitchell, MD;  Location: [place];  Service: Vascular;  Laterality: Right;  . APPENDECTOMY    . BACK SURGERY    . CARDIAC CATHETERIZATION  1980s; 2012;  . CATARACT EXTRACTION Left 2004  . COLONOSCOPY  2004   FLEISHMAN TICS  . CORONARY ANGIOPLASTY WITH STENT PLACEMENT  05/18/2017   "3 stents"  . CORONARY STENT INTERVENTION N/A 05/18/2017   Procedure: CORONARY STENT INTERVENTION;  Surgeon: Jettie Booze, MD;  Location: [place] [facility];  Service: Cardiovascular;  Laterality: N/A;  . ESOPHAGOGASTRODUODENOSCOPY (EGD) WITH PROPOFOL N/A 11/20/2017   Procedure: ESOPHAGOGASTRODUODENOSCOPY (EGD) WITH PROPOFOL;  Surgeon: Irene Shipper, MD;  Location: [place];  Service: Gastroenterology;  Laterality: N/A;  . JOINT REPLACEMENT    . LEFT HEART CATH AND CORONARY ANGIOGRAPHY N/A 05/18/2017   Procedure: LEFT HEART CATH AND CORONARY ANGIOGRAPHY;  Surgeon: Jettie Booze, MD;  Location: [place] [facility];  Service: Cardiovascular;  Laterality: N/A;  . LOWER EXTREMITY ANGIOGRAM Right 09/20/2017   Procedure: RIGHT LOWER LEG  ANGIOGRAM;  Surgeon:  Serafina Mitchell, MD;  Location: [place];  Service: Vascular;  Laterality: Right;  . LUMBAR FUSION  2002   L3, 4 L4, 5 L5 S1 Fused by Dr. Glenna Fellows  . PERIPHERAL VASCULAR BALLOON ANGIOPLASTY Left 09/15/2017   Procedure: PERIPHERAL VASCULAR BALLOON ANGIOPLASTY;  Surgeon: Serafina Mitchell, MD;  Location: [place] [facility];  Service: Cardiovascular;  Laterality: Left;  PTA of Peroneal & Posterior Tibial  . POSTERIOR LUMBAR FUSION    . RHINOPLASTY    . RIGHT HEART CATH N/A 05/18/2017   Procedure: RIGHT HEART CATH;  Surgeon: Jettie Booze, MD;  Location: [place] [facility];  Service: Cardiovascular;  Laterality: N/A;  . TEE WITHOUT CARDIOVERSION N/A 09/18/2017   Procedure: TRANSESOPHAGEAL ECHOCARDIOGRAM (TEE);  Surgeon: Fay Records, MD;  Location: [place];  Service:  Cardiovascular;  Laterality: N/A;  . TOTAL KNEE ARTHROPLASTY Bilateral   . TRANSURETHRAL RESECTION OF PROSTATE  2001   [place]    Social History:  reports that he quit smoking about 34 years ago. His smoking use included cigarettes. He started smoking about 64 years ago. He has a 60.00 pack-year smoking history. He has never used smokeless tobacco. He reports that he drank alcohol. He reports that he does not use drugs.  Allergies: Allergies  Allergen Reactions  . Codeine Shortness Of Breath  . Heparin Other (See Comments)    +HIT,  Severe bleeding (with heparin drip & large doses)  . Losartan Swelling    Per ENT UNSPECIFIED REACTION   . Other Other (See Comments)    Severe bleeding UNSPECIFIED AGENT   . Oxycodone Other (See Comments)    "Made me act out of my mind" Other reaction(s): Other (See Comments) Mental status changes hallucinations    Family History:  Family History  Problem Relation Age of Onset  . Heart attack Father 82  . COPD Father   . COPD Mother   . Heart disease Mother   . Diabetes Mother   . Hypertension Sister   . CVA Sister   . Diabetes Sister   . Multiple sclerosis Sister      Prior to Admission medications   Medication Sig Start Date End Date Taking? Authorizing Provider  acetaminophen (TYLENOL) 500 MG tablet Take 500 mg by mouth as needed for mild pain or headache.   Yes [provider]  albuterol (PROVENTIL) (2.5 MG/3ML) 0.083% nebulizer solution Inhale 3 mLs into the lungs every 4 (four) hours as needed for wheezing or shortness of breath. 05/20/17  Yes Daune Perch, NP  amLODipine (NORVASC) 10 MG tablet Take 1 tablet (10 mg total) by mouth daily. 06/05/17  Yes BranchAlphonse Guild, MD  atorvastatin (LIPITOR) 80 MG tablet Take 1 tablet (80 mg total) by mouth every evening. 05/19/17  Yes Dunn, Nedra Hai, PA-C  Cholecalciferol (D3-1000 PO) Take 1,000 Units by mouth daily.    Yes [provider]  clopidogrel (PLAVIX) 75 MG tablet Take 1 tablet (75 mg total) by mouth daily. 05/19/17  Yes Dunn, Dayna N, PA-C  collagenase (SANTYL) ointment Apply 1 application topically daily. 11/13/17  Yes Serafina Mitchell, MD  Cyanocobalamin (B-12 PO) Take 1 tablet by mouth daily.    Yes [provider]  finasteride (PROSCAR) 5 MG tablet Take 5 mg by mouth daily.  04/17/15  Yes [provider]  furosemide (LASIX) 20 MG tablet TAKE 1 TABLET DAILY AS NEEDED Patient taking differently: Take 20 mg by mouth daily.  08/15/17  Yes BranchAlphonse Guild, MD  insulin glargine (LANTUS) 100 UNIT/ML injection Inject 30-40 Units into the skin See admin instructions. Inject 40 units SQ in the morning and inject 30 units SQ at bedtime   Yes [provider]  metFORMIN (GLUCOPHAGE) 1000 MG tablet Take 1,000 mg by mouth 2 (two) times daily. 05/22/17  Yes [provider]  metoprolol succinate (TOPROL-XL) 100 MG 24 hr tablet TAKE 1/2 TABLET EVERY DAY Patient taking differently: Take 100 mg by mouth daily.  05/24/17  Yes Branch, Alphonse Guild, MD  pantoprazole (PROTONIX) 40 MG tablet Take 1 tablet (40 mg total) by mouth daily. To protect stomach while taking multiple  blood thinners. 11/22/17  Yes Cheryln Manly, NP  sulfamethoxazole-trimethoprim (BACTRIM,SEPTRA) 400-80 MG tablet Take 1 tablet by mouth 2 (two) times daily. 11/13/17  Yes Serafina Mitchell,  MD  traZODone (DESYREL) 100 MG tablet Take 100 mg by mouth at bedtime.  03/25/16  Yes [provider]  apixaban (ELIQUIS) 5 MG TABS tablet Take 1 tablet (5 mg total) by mouth 2 (two) times daily. 11/23/15   Arnoldo Lenis, MD  meclizine (ANTIVERT) 12.5 MG tablet Take 1 tablet (12.5 mg total) by mouth 3 (three) times daily as needed for dizziness. 05/30/17   Richardson Dopp T, PA-C  nitroGLYCERIN (NITROSTAT) 0.4 MG SL tablet PLACE 1 TABLET (0.4 MG TOTAL) UNDER THE TONGUE EVERY 5  MINUTES AS NEEDED FOR CHEST PAIN. Patient taking differently: Place 0.4 mg under the tongue every 5 (five) minutes as needed.  05/22/17   Arnoldo Lenis, MD  psyllium (METAMUCIL) 58.6 % packet Take 1 packet by mouth daily.    [provider]    Review of Systems:  Constitutional: Denies  diaphoresis, appetite change. HEENT: Denies photophobia, eye pain, redness, hearing loss, ear pain, sneezing, mouth sores, trouble swallowing, neck pain, neck stiffness and tinnitus.   Respiratory: Denies wheezing.   Cardiovascular: Denies palpitations and leg swelling.  Gastrointestinal: Denies nausea, vomiting, abdominal pain, diarrhea, constipation, blood in stool and abdominal distention.  Genitourinary: Denies dysuria, urgency, frequency, hematuria, flank pain and difficulty urinating.  Endocrine: Denies: hot or cold intolerance, sweats, changes in hair or nails, polyuria, polydipsia. Musculoskeletal: Denies myalgias, back pain, joint swelling, arthralgias and gait problem.  Skin: Denies pallor, rash and wound.  Neurological: Denies dizziness, seizures, syncope, weakness, light-headedness, numbness and headaches.  Hematological: Denies adenopathy. Easy bruising, personal or family bleeding history  Psychiatric/Behavioral:  Denies suicidal ideation, mood changes, confusion, nervousness, sleep disturbance and agitation    Physical Exam: Vitals:   12/02/17 1330 12/02/17 1400 12/02/17 1430 12/02/17 1500  BP: 118/70 108/62 120/66 105/77  Pulse: 85 85 91 93  Resp:  (!) 23 (!) 25 20  Temp:      TempSrc:      SpO2: 97% 99% 94% 96%  Weight:      Height:         Constitutional: NAD, calm, comfortable Eyes: PERRL, lids and conjunctivae normal ENMT: Mucous membranes are moist. Posterior pharynx clear of any exudate or lesions.Normal dentition.  Neck: normal, supple, no masses, no thyromegaly Respiratory: clear to auscultation bilaterally, no wheezing, no crackles. Normal respiratory effort. No accessory muscle use.  Cardiovascular: Regular rate and rhythm, no murmurs / rubs / gallops. No extremity edema. 2+ pedal pulses. No carotid bruits.  Abdomen: no tenderness, no masses palpated. No hepatosplenomegaly. Bowel sounds positive.  Musculoskeletal: no clubbing / cyanosis. No joint deformity upper and lower extremities. Good ROM, no contractures. Normal muscle tone.  Skin: no rashes, lesions, ulcers. No induration Neurologic: CN 2-12 grossly intact. Sensation intact, DTR normal. Strength 5/5 in all 4.  Psychiatric: Normal judgment and insight. Alert and oriented x 3. Normal mood.    Labs on Admission: I have personally reviewed the following labs and imaging studies  CBC: Recent Labs  Lab 12/02/17 0950  WBC 1.7*  NEUTROABS 0.7*  HGB 9.9*  HCT 30.6*  MCV 75.7*  PLT 782*   Basic Metabolic Panel: Recent Labs  Lab 12/02/17 0950  NA 134*  K 4.2  CL 102  CO2 22  GLUCOSE 123*  BUN 30*  CREATININE 1.35*  CALCIUM 8.2*   GFR: Estimated Creatinine Clearance: 45.9 mL/min (A) (by C-G formula based on SCr of 1.35 mg/dL (H)). Liver Function Tests: Recent Labs  Lab 12/02/17 0950  AST 41  ALT 21  ALKPHOS 97  BILITOT 1.0  PROT 6.1*  ALBUMIN 3.1*   Recent Labs  Lab 12/02/17 0950  LIPASE 29   No  results for input(s): AMMONIA in the last 168 hours. Coagulation Profile: No results for input(s): INR, PROTIME in the last 168 hours. Cardiac Enzymes: Recent Labs  Lab 12/02/17 0950 12/02/17 1430  TROPONINI <0.03 <0.03   BNP (last 3 results) No results for input(s): PROBNP in the last 8760 hours. HbA1C: No results for input(s): HGBA1C in the last 72 hours. CBG: No results for input(s): GLUCAP in the last 168 hours. Lipid Profile: No results for input(s): CHOL, HDL, LDLCALC, TRIG, CHOLHDL, LDLDIRECT in the last 72 hours. Thyroid Function Tests: No results for input(s): TSH, T4TOTAL, FREET4, T3FREE, THYROIDAB in the last 72 hours. Anemia Panel: No results for input(s): VITAMINB12, FOLATE, FERRITIN, TIBC, IRON, RETICCTPCT in the last 72 hours. Urine analysis:    Component Value Date/Time   COLORURINE YELLOW 12/02/2017 [place] 12/02/2017 1343   LABSPEC 1.016 12/02/2017 1343   PHURINE 5.0 12/02/2017 1343   GLUCOSEU NEGATIVE 12/02/2017 1343   HGBUR NEGATIVE 12/02/2017 1343   BILIRUBINUR NEGATIVE 12/02/2017 1343   KETONESUR NEGATIVE 12/02/2017 1343   PROTEINUR 30 (A) 12/02/2017 1343   NITRITE NEGATIVE 12/02/2017 1343   LEUKOCYTESUR NEGATIVE 12/02/2017 1343   Sepsis Labs: @LABRCNTIP (procalcitonin:4,lacticidven:4) ) Recent Results (from the past 240 hour(s))  Culture, blood (routine x 2)     Status: None (Preliminary result)   Collection Time: 12/02/17 11:06 AM  Result Value Ref Range Status   Specimen Description RIGHT ANTECUBITAL  Final   Special Requests   Final    BOTTLES DRAWN AEROBIC AND ANAEROBIC Blood Culture adequate volume Performed at [facility], [address] [place], [place] [address]    Culture PENDING  Incomplete   Report Status PENDING  Incomplete  Culture, blood (routine x 2)     Status: None (Preliminary result)   Collection Time: 12/02/17 11:10 AM  Result Value Ref Range Status   Specimen Description BLOOD RIGHT HAND  Final    Special Requests   Final    BOTTLES DRAWN AEROBIC AND ANAEROBIC Blood Culture adequate volume Performed at [facility], [address], [place], [place] [address]    Culture PENDING  Incomplete   Report Status PENDING  Incomplete     Radiological Exams on Admission: Dg Ribs Unilateral W/chest Right  Result Date: 12/02/2017 CLINICAL DATA:  Progressive cough. Shortness of breath. Right-sided chest pain. Fall 2 weeks ago with right anterior rib pain. EXAM: RIGHT RIBS AND CHEST - 3+ VIEW COMPARISON:  11/17/2017 FINDINGS: Bibasilar subsegmental atelectasis. This is improved at the right lung base compared 11/17/2017. No appreciable pneumothorax or pulmonary contusion. Probable nondisplaced fracture the right anterior seventh rib. Questionable nondisplaced right anterior eighth rib fracture. No displaced rib fracture is identified. Tortuous thoracic aorta. Heart size within normal limits for projection. IMPRESSION: 1. Subtle possible nondisplaced fractures of the right anterior seventh and eighth ribs. No displaced fracture identified. 2. Bibasilar subsegmental atelectasis, improved at the right lung base compared 11/17/2017. Electronically Signed   By: Van Clines M.D.   On: 12/02/2017 11:00   Ct Angio Chest Pe W/cm &/or Wo Cm  Addendum Date: 12/02/2017   ADDENDUM REPORT: 12/02/2017 13:52 ADDENDUM: Addendum created after further review and discussion with the referring physician. There is nondisplaced rib fracture of the anterior right fourth rib, at the chondral junction. No sternal fracture or left rib fracture is  identified. No rib fracture of the rib angles at the seventh and eighth ribs identified, however, a portion of the data set is not included on the axial images given the patient's body habitus. These results were discussed by telephone at the time of interpretation on 12/02/2017 at 1:52 pm with Dr. Nunzio Cory [facility] Electronically Signed   By: Corrie Mckusick D.O.   On: 12/02/2017 13:52    Result Date: 12/02/2017 CLINICAL DATA:  84 year old male with a history of cough EXAM: CT ANGIOGRAPHY CHEST WITH CONTRAST TECHNIQUE: Multidetector CT imaging of the chest was performed using the standard protocol during bolus administration of intravenous contrast. Multiplanar CT image reconstructions and MIPs were obtained to evaluate the vascular anatomy. CONTRAST:  128mL ISOVUE-370 IOPAMIDOL (ISOVUE-370) INJECTION 76% COMPARISON:  CT angio chest 03/24/2010 FINDINGS: Cardiovascular: Heart: Heart size enlarged, unchanged from comparison chest x-ray studies, and new from the CT dated 2011. Dense calcifications/stenting of the left anterior descending coronary artery. Calcifications of the circumflex and right coronary arteries. Minimal calcifications of the mitral annulus and aortic valve. No pericardial fluid/thickening. Aorta: Diameter of the ascending aorta estimated 3.4 cm beyond the sino-tubular junction. Minimal calcifications of the aortic arch. Branch vessels are patent. Three vessel arch. Cervical vessels are patent at the neck base. Mild calcifications of the descending aorta. Pulmonary arteries: No central, lobar, segmental, or proximal subsegmental filling defects. Mediastinum/Nodes: Small lymph nodes of the mediastinum. The largest measures approximately 6 mm in the lowest right paratracheal nodal station. Unremarkable course of the thoracic esophagus. Unremarkable thoracic inlet. Lungs/Pleura: Lungs demonstrate diffuse geographic ground-glass, with more confluent regions of hazy opacity in the dependent regions along the fissures and at the posterior lung bases. No pleural effusion. No tree-in-bud opacity. Centrilobular emphysema with developing paraseptal emphysema at the apices. No endotracheal or endobronchial debris. No significant bronchial wall thickening. No interlobular septal thickening. No pneumothorax. Upper Abdomen: No acute. Musculoskeletal: Degenerative changes of the spine. No acute  bony abnormality. Review of the MIP images confirms the above findings. . IMPRESSION: Negative for pulmonary emboli. Geographic ground-glass opacity of the lungs is most suggestive of either chronic small airway disease or chronic small vessel disease. Background of developing centrilobular and paraseptal emphysema. Emphysema (ICD10-J43.9). Since the prior CT of 2011, there has been development of cardiomegaly. Native coronary artery disease, with stenting/calcification of the left anterior descending, as well as disease involving circumflex and right coronary arteries. Associated aortic atherosclerosis. Aortic Atherosclerosis (ICD10-I70.0). Electronically Signed: By: Corrie Mckusick D.O. On: 12/02/2017 13:22    EKG: Independently reviewed.  As described in HPI.  Assessment/Plan Principal Problem:   Dyspnea Active Problems:   Diabetes ([place])   Essential hypertension   CAD S/P percutaneous coronary angioplasty   Chronic diastolic CHF (congestive heart failure) ([facility])   PAF (paroxysmal atrial fibrillation) ([facility])   Gangrene of bilateral great toes ([facility])   Pancytopenia ([facility])    Dyspnea/chest pain/acute hypoxemic respiratory failure -Etiology does not remain totally clear at this time. -Despite emphysema noted on CT chest, patient does not have a history of COPD, he is also not wheezing on lung auscultation.  I feel that treatment for COPD is not warranted at this point. -There is no indication for pneumonia on chest x-ray or CT chest, antibiotics have not been started. -He has had some upper respiratory infection symptoms and this may well be the etiology, symptomatic management for a presumed viral URI. -He does have a nondisplaced rib fracture of the anterior right fourth rib, this may be causing  some of his shortness of breath, he is not complaining of significant pain in that area. -Given his history of coronary artery disease and lack of cardiology coverage at [facility] throughout the  weekend, I believe it is reasonable for patient to be transferred to [facility] to be evaluated by cardiology.  He has essentially ruled out for ACS with 2- troponins, I am somewhat concerned with first EKG done today as well as with chest pain from yesterday that aborted with nitroglycerin.  EDP discussed case with Dr. Stanford Breed who will see patient in consultation.  Pancytopenia -Etiology of this is also unclear. -He is not an alcoholic.  Labs done 10 days ago showed some anemia but definitely no thrombocytopenia or leukopenia. -He does have symptoms of a viral URI, it is possible that a viral infection may cause temporary pancytopenia. -No indication to treat for tick borne illness at this time. -Pancultures have been requested. -No fever documented in the ED or at home although he has been complaining of feeling feverish and with chills. -Monitor counts daily, consider hematology consultation if counts continue to decline without clear-cut etiology.   -Plavix is not an entirely recent addition so doubt this is the cause.  Acute on chronic kidney disease stage II -Baseline creatinine appears to be around 1-1.1.  Creatinine on admission is 1.35. -We will give some gentle IV fluid hydration over the next 24 hours. -Continue to follow renal function.  Paroxysmal atrial fibrillation/a flutter -Rate is currently controlled. -Was taken off Eliquis recently due to significant iron deficiency anemia requiring transfusion and need to continue Plavix given recent PCI.  Type 2 diabetes -Check A1c, hold metformin. -Continue Lantus and place on moderate sliding scale.  Benign essential hypertension -Well-controlled, continue home meds.  Hyperlipidemia -Continue statin.   DVT prophylaxis: SCDs given thrombocytopenia and recent transfusion dependent anemia Code Status: Full code Family Communication: Patient only Disposition Plan: Transfer to [place] called: EDP discussed  with cardiology, Dr. Stanford Breed Admission status: Admit - It is my clinical opinion that admission to INPATIENT is reasonable and necessary because of the expectation that this patient will require hospital care that crosses at least 2 midnights to treat this condition based on the medical complexity of the problems presented.  Given the aforementioned information, the predictability of an adverse outcome is felt to be significant.      Time Spent: 85 minutes  Latoyia Tecson Isaac Bliss MD [facility] Pager [phone]  If 7PM-7AM, please contact night-coverage [email or web address] Password [email or web address]  12/02/2017, 4:16 PM

## 2017-12-03 ENCOUNTER — Encounter (HOSPITAL_COMMUNITY): Payer: Self-pay | Admitting: Cardiology

## 2017-12-03 DIAGNOSIS — I10 Essential (primary) hypertension: Secondary | ICD-10-CM

## 2017-12-03 DIAGNOSIS — I5032 Chronic diastolic (congestive) heart failure: Secondary | ICD-10-CM

## 2017-12-03 DIAGNOSIS — I2510 Atherosclerotic heart disease of native coronary artery without angina pectoris: Secondary | ICD-10-CM

## 2017-12-03 DIAGNOSIS — D61818 Other pancytopenia: Secondary | ICD-10-CM

## 2017-12-03 DIAGNOSIS — Z9861 Coronary angioplasty status: Secondary | ICD-10-CM

## 2017-12-03 DIAGNOSIS — I481 Persistent atrial fibrillation: Secondary | ICD-10-CM

## 2017-12-03 LAB — GLUCOSE, CAPILLARY
Glucose-Capillary: 279 mg/dL — ABNORMAL HIGH (ref 70–99)
Glucose-Capillary: 179 mg/dL — ABNORMAL HIGH (ref 70–99)
Glucose-Capillary: 109 mg/dL — ABNORMAL HIGH (ref 70–99)
Glucose-Capillary: 144 mg/dL — ABNORMAL HIGH (ref 70–99)

## 2017-12-03 LAB — BASIC METABOLIC PANEL
ANION GAP: 12 (ref 5–15)
BUN: 31 mg/dL — AB (ref 8–23)
CALCIUM: 8.3 mg/dL — AB (ref 8.9–10.3)
CO2: 18 mmol/L — AB (ref 22–32)
Chloride: 104 mmol/L (ref 98–111)
Creatinine, Ser: 1.23 mg/dL (ref 0.61–1.24)
GFR calc Af Amer: >60 mL/min (ref >60)
GFR calc non Af Amer: 53 mL/min — ABNORMAL LOW (ref >60)
GLUCOSE: 277 mg/dL — AB (ref 70–99)
Potassium: 4.8 mmol/L (ref 3.5–5.1)
Sodium: 134 mmol/L — ABNORMAL LOW (ref 135–145)

## 2017-12-03 LAB — CBC
HCT: 31.2 % — ABNORMAL LOW (ref 39.0–52.0)
HEMOGLOBIN: 9.7 g/dL — AB (ref 13.0–17.0)
MCH: 24.4 pg — AB (ref 26.0–34.0)
MCHC: 31.1 g/dL (ref 30.0–36.0)
MCV: 78.6 fL (ref 78.0–100.0)
Platelets: 100 10*3/uL — ABNORMAL LOW (ref 150–400)
RBC: 3.97 MIL/uL — ABNORMAL LOW (ref 4.22–5.81)
RDW: 19.5 % — AB (ref 11.5–15.5)
WBC: 1.8 10*3/uL — ABNORMAL LOW (ref 4.0–10.5)

## 2017-12-03 LAB — TROPONIN I

## 2017-12-03 MED ORDER — FUROSEMIDE 10 MG/ML IJ SOLN
40.0000 mg | Freq: Once | INTRAMUSCULAR | Status: AC
  Administered 2017-12-03: 40 mg via INTRAVENOUS
  Filled 2017-12-03: qty 4

## 2017-12-03 MED ORDER — INFLUENZA VAC SPLIT HIGH-DOSE 0.5 ML IM SUSY
0.5000 mL | PREFILLED_SYRINGE | INTRAMUSCULAR | Status: AC
  Administered 2017-12-04: 0.5 mL via INTRAMUSCULAR
  Filled 2017-12-03: qty 0.5

## 2017-12-03 NOTE — Progress Notes (Signed)
[facility]  PROGRESS NOTE  LAKEITH CAREAGA HYI:[id number] DOB: 02/13/1934 DOA: 12/02/2017 PCP: [facility]   Brief HPI:   84 year old male with history of multiple medical comorbidities significant for CAD status post LAD, stenting x2 in February 2019, COPD, hypothyroidism, atrial fibrillation, taken off anticoagulation due to anemia/GI bleed, hypertension came to hospital with worsening shortness of breath and chest pain.  Chest x-ray showed nondisplaced fracture of the right anterior seventh and eighth rib.  CT negative for PE.  Patient was transferred from [facility] hospital for cardiology evaluation.    Subjective   This morning patient does complain of right-sided chest pain   Assessment/Plan:     1. Dyspnea/chest pain-chest x-ray negative for pneumonia, chest x-ray does show nondisplaced rib fracture of anterior right seventh and eighth rib.  Given significant history of CAD with recent PCI with stenting to LAD x2 cardiology has been consulted for further recommendations.  2. Pancytopenia-unclear etiology, patient had relatively normal labs on 11/21/2017.  Unclear etiology.  Follow CBC in a.m.  Might need hematology consultation  3. Acute on CKD stage II-Baseline creatinine is around 1.1.,  Creatinine on admission was 1.35.  Has improved to 1.23  4. Atrial fibrillation/flutter-heart rate is controlled, patient was taken off Eliquis recently due to iron deficiency anemia requiring blood transfusion.  Continue Plavix  5. Diabetes mellitus type 2-glucose controlled, metformin is on hold.  Continue sliding scale insulin with NovoLog, Lantus.  6. Hypertension-blood pressure stable continue home medications.  7. Hyperlipidemia-continue statin     CBG: Recent Labs  Lab 12/02/17 1816 12/02/17 2131 12/03/17 0744 12/03/17 1146  GLUCAP 218* 425* 279* 179*    CBC: Recent Labs  Lab 12/02/17 0950 12/03/17 0635  WBC 1.7* 1.8*  NEUTROABS 0.7*  --   HGB 9.9* 9.7*   HCT 30.6* 31.2*  MCV 75.7* 78.6  PLT 113* 100*    Basic Metabolic Panel: Recent Labs  Lab 12/02/17 0950 12/03/17 0635  NA 134* 134*  K 4.2 4.8  CL 102 104  CO2 22 18*  GLUCOSE 123* 277*  BUN 30* 31*  CREATININE 1.35* 1.23  CALCIUM 8.2* 8.3*     DVT prophylaxis: SCDs  Code Status: Full code  Family Communication: No family at bedside  Disposition Plan: likely home when medically ready for discharge   Consultants:  Cardiology  Procedures:  None   Antibiotics:   Anti-infectives (From admission, onward)   None       Objective   Vitals:   12/02/17 1940 12/02/17 2133 12/03/17 0344 12/03/17 0715  BP: 119/70 123/66 121/73 (!) 141/72  Pulse: 91 85 80 84  Resp:  20 20 18   Temp:  97.8 F (36.6 C) 97.6 F (36.4 C)   TempSrc:  Oral Oral   SpO2:  95% 97% 94%  Weight:   93.4 kg   Height:        Intake/Output Summary (Last 24 hours) at 12/03/2017 1203 Last data filed at 12/03/2017 1015 Gross per 24 hour  Intake 1860.35 ml  Output 1950 ml  Net -89.65 ml   Filed Weights   12/02/17 0927 12/02/17 1809 12/03/17 0344  Weight: 93 kg 92.2 kg 93.4 kg     Physical Examination:    General:  *Appears in no acute distress  Cardiovascular: S1-S2, regular  Respiratory: Clear bilaterally  Abdomen: Soft, nontender, no organomegaly  Extremities: No edema in the lower extremities  Neurologic: Alert, oriented x3, no focal deficit noted     Data Reviewed:  I have personally reviewed following labs and imaging studies   Recent Results (from the past 240 hour(s))  Culture, blood (routine x 2)     Status: None (Preliminary result)   Collection Time: 12/02/17 11:06 AM  Result Value Ref Range Status   Specimen Description RIGHT ANTECUBITAL  Final   Special Requests   Final    BOTTLES DRAWN AEROBIC AND ANAEROBIC Blood Culture adequate volume   Culture   Final    NO GROWTH < 24 HOURS Performed at [facility], [address] [place], [place] [address]     Report Status PENDING  Incomplete  Culture, blood (routine x 2)     Status: None (Preliminary result)   Collection Time: 12/02/17 11:10 AM  Result Value Ref Range Status   Specimen Description BLOOD RIGHT HAND  Final   Special Requests   Final    BOTTLES DRAWN AEROBIC AND ANAEROBIC Blood Culture adequate volume   Culture   Final    NO GROWTH < 24 HOURS Performed at [facility], [address], [place], [place] [address]    Report Status PENDING  Incomplete     Liver Function Tests: Recent Labs  Lab 12/02/17 0950  AST 41  ALT 21  ALKPHOS 97  BILITOT 1.0  PROT 6.1*  ALBUMIN 3.1*   Recent Labs  Lab 12/02/17 0950  LIPASE 29   No results for input(s): AMMONIA in the last 168 hours.  Cardiac Enzymes: Recent Labs  Lab 12/02/17 0950 12/02/17 1430 12/02/17 1811 12/02/17 2350 12/03/17 0635  TROPONINI <0.03 <0.03 <0.03 <0.03 <0.03   BNP (last 3 results) Recent Labs    05/17/17 1530 11/17/17 1548 12/02/17 0950  BNP 318.5* 192.0* 180.0*    ProBNP (last 3 results) No results for input(s): PROBNP in the last 8760 hours.    Studies: Dg Ribs Unilateral W/chest Right  Result Date: 12/02/2017 CLINICAL DATA:  Progressive cough. Shortness of breath. Right-sided chest pain. Fall 2 weeks ago with right anterior rib pain. EXAM: RIGHT RIBS AND CHEST - 3+ VIEW COMPARISON:  11/17/2017 FINDINGS: Bibasilar subsegmental atelectasis. This is improved at the right lung base compared 11/17/2017. No appreciable pneumothorax or pulmonary contusion. Probable nondisplaced fracture the right anterior seventh rib. Questionable nondisplaced right anterior eighth rib fracture. No displaced rib fracture is identified. Tortuous thoracic aorta. Heart size within normal limits for projection. IMPRESSION: 1. Subtle possible nondisplaced fractures of the right anterior seventh and eighth ribs. No displaced fracture identified. 2. Bibasilar subsegmental atelectasis, improved at the right lung base compared  11/17/2017. Electronically Signed   By: Van Clines M.D.   On: 12/02/2017 11:00   Ct Angio Chest Pe W/cm &/or Wo Cm  Addendum Date: 12/02/2017   ADDENDUM REPORT: 12/02/2017 13:52 ADDENDUM: Addendum created after further review and discussion with the referring physician. There is nondisplaced rib fracture of the anterior right fourth rib, at the chondral junction. No sternal fracture or left rib fracture is identified. No rib fracture of the rib angles at the seventh and eighth ribs identified, however, a portion of the data set is not included on the axial images given the patient's body habitus. These results were discussed by telephone at the time of interpretation on 12/02/2017 at 1:52 pm with Dr. Nunzio [facility] Electronically Signed   By: Corrie Mckusick D.O.   On: 12/02/2017 13:52   Result Date: 12/02/2017 CLINICAL DATA:  84 year old male with a history of cough EXAM: CT ANGIOGRAPHY CHEST WITH CONTRAST TECHNIQUE: Multidetector CT imaging of  the chest was performed using the standard protocol during bolus administration of intravenous contrast. Multiplanar CT image reconstructions and MIPs were obtained to evaluate the vascular anatomy. CONTRAST:  174mL ISOVUE-370 IOPAMIDOL (ISOVUE-370) INJECTION 76% COMPARISON:  CT angio chest 03/24/2010 FINDINGS: Cardiovascular: Heart: Heart size enlarged, unchanged from comparison chest x-ray studies, and new from the CT dated 2011. Dense calcifications/stenting of the left anterior descending coronary artery. Calcifications of the circumflex and right coronary arteries. Minimal calcifications of the mitral annulus and aortic valve. No pericardial fluid/thickening. Aorta: Diameter of the ascending aorta estimated 3.4 cm beyond the sino-tubular junction. Minimal calcifications of the aortic arch. Branch vessels are patent. Three vessel arch. Cervical vessels are patent at the neck base. Mild calcifications of the descending aorta. Pulmonary arteries: No central,  lobar, segmental, or proximal subsegmental filling defects. Mediastinum/Nodes: Small lymph nodes of the mediastinum. The largest measures approximately 6 mm in the lowest right paratracheal nodal station. Unremarkable course of the thoracic esophagus. Unremarkable thoracic inlet. Lungs/Pleura: Lungs demonstrate diffuse geographic ground-glass, with more confluent regions of hazy opacity in the dependent regions along the fissures and at the posterior lung bases. No pleural effusion. No tree-in-bud opacity. Centrilobular emphysema with developing paraseptal emphysema at the apices. No endotracheal or endobronchial debris. No significant bronchial wall thickening. No interlobular septal thickening. No pneumothorax. Upper Abdomen: No acute. Musculoskeletal: Degenerative changes of the spine. No acute bony abnormality. Review of the MIP images confirms the above findings. . IMPRESSION: Negative for pulmonary emboli. Geographic ground-glass opacity of the lungs is most suggestive of either chronic small airway disease or chronic small vessel disease. Background of developing centrilobular and paraseptal emphysema. Emphysema (ICD10-J43.9). Since the prior CT of 2011, there has been development of cardiomegaly. Native coronary artery disease, with stenting/calcification of the left anterior descending, as well as disease involving circumflex and right coronary arteries. Associated aortic atherosclerosis. Aortic Atherosclerosis (ICD10-I70.0). Electronically Signed: By: Corrie Mckusick D.O. On: 12/02/2017 13:22    Scheduled Meds: . amLODipine  10 mg Oral Daily  . atorvastatin  80 mg Oral QPM  . clopidogrel  75 mg Oral Daily  . finasteride  5 mg Oral Daily  . furosemide  20 mg Oral Daily  . insulin aspart  0-15 Units Subcutaneous TID WC  . insulin aspart  0-5 Units Subcutaneous QHS  . insulin aspart  4 Units Subcutaneous TID WC  . insulin glargine  30 Units Subcutaneous QHS  . insulin glargine  40 Units  Subcutaneous Daily  . metoprolol succinate  100 mg Oral Daily  . pantoprazole  40 mg Oral Daily  . sodium chloride flush  3 mL Intravenous Q12H  . traZODone  100 mg Oral QHS      Time spent: 25 min  [facility] Pager [phone]. If 7PM-7AM, please contact night-coverage at [email or web address], Office  [phone]  password [email or web address]  12/03/2017, 12:03 PM  LOS: 1 day

## 2017-12-03 NOTE — Consult Note (Addendum)
Cardiology Consultation:   Patient ID: TRYSTYN DOLLEY MRN: [id number]; DOB: [birth year]  Admit date: 12/02/2017 Date of Consult: 12/03/2017  Primary Care Provider: Medicine, [facility] Primary Cardiologist: Carlyle Dolly, MD  Primary Electrophysiologist:  None    Patient Profile:   KADAR CHANCE is a 84 y.o. male with a hx of CAD, prior PCI, PAD, a fib and HFpEF, HTN, DM, HLD -recent discharge for chest pain 11/22/17 who is being seen today for the evaluation of chest pain at the request of Dr. Darrick Meigs.  History of Present Illness:   Mr. Calabretta with above hx and last cath 04/2017 with PCI to LAD X 2, and D1.  Discharged 11/22/17 with admit for chest pain.- EKG was normal, HGB was 8.1 - he was transfused with resolution of chest pain.  Neg MI.  Pt is on plavix and Eliquis but no ASA.  Eliquis was held at time of discharge but plavix continued.  At that time was maintaining SR.  And for HFpEF lasix was added.   For abd pain, he was to follow with GI.     Now with SOB for past few weeks, also Rt sided chest pain with coughing. (he tells me he has chronic chest pain with COPD) He woke from nap yesterday afternoon with substernal chest pain resolved after second NTG.  He went to bed and woke at 2200 with extreme SOB but no chest pain.  Was able to go back to sleep.  Then this AM he was SOB son EMS called, sp02 was 93% on RA  EKG I personally reviewed.  Appears a flutter 3:1 conduction no acute changes.    Tele;  I personally reviewed a fib with RVR to 123 at times.  Troponin <0.03 X 4  Na 134, K+ 4.8, glucose 277, Cr 1.23, Ca+ 8.3 hgb 9.7, hct 31.2 Plts 100 and WBC 1.8.  Hgb A1c 7.4 TSH 1.559  CXR with rib fx non displaced.   CT A of chest with no PE,  Geographic ground-glass opacity of the lungs is most suggestive of either chronic small airway disease or chronic small vessel disease.  Background of developing centrilobular and paraseptal emphysema. + fx rib   Currently no chest pain  but + SOB.  With chest pain sometimes difficult to tell if Cardiac or COPD.      Past Medical History:  Diagnosis Date  . Anemia    a. mild, noted 04/2017.  [name] CAD in native artery    a. [name] 04/2017 s/p DES to D1, DES to prox-mid LAD, DES to prox LAD overlapping the prior stent, LVEF 55-65%.   . Chronic diastolic CHF (congestive heart failure) ([place])   . Diabetic ulcer of toe ([place])   . DJD (degenerative joint disease) of cervical spine   . Essential hypertension, benign   . GERD (gastroesophageal reflux disease)   . Heart murmur   . History of hiatal hernia   . HIT (heparin-induced thrombocytopenia) ([place])   . Hypothyroidism   . Hypoxia    a. went home on home O2 04/2017.  [name] Insomnia   . Mixed hyperlipidemia   . PAD (peripheral artery disease) ([place])   . PAF (paroxysmal atrial fibrillation) ([place])   . PSVT (paroxysmal supraventricular tachycardia) ([place])   . PVD (peripheral vascular disease) ([place])   . Renal insufficiency   . 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  . Sinus bradycardia    a. HR 30s-40s in 04/2017 -> diltiazem stopped,  metoprolol reduced.  . Sleep apnea    "chose not to order CPAP at this time" (05/18/2017)  . Type 2 diabetes mellitus ([place])   . Wheezing    a. suspected COPD 04/2017. Former tobacco x 40 years.    Past Surgical History:  Procedure Laterality Date  . ABDOMINAL AORTOGRAM W/LOWER EXTREMITY N/A 09/15/2017   Procedure: ABDOMINAL AORTOGRAM W/LOWER EXTREMITY;  Surgeon: Serafina Mitchell, MD;  Location: [facility];  Service: Cardiovascular;  Laterality: N/A;  . AMPUTATION Bilateral 09/20/2017   Procedure: BILATERAL GREAT TOE AMPUTATIONS INCLUDING METATARSAL HEADS;  Surgeon: Serafina Mitchell, MD;  Location: [facility];  Service: Vascular;  Laterality: Bilateral;  . ANGIOPLASTY Right 09/20/2017   Procedure: ANGIOPLASTY RIGHT TIBIAL ARTERY;  Surgeon: Serafina Mitchell, MD;  Location: [place];  Service: Vascular;  Laterality: Right;  . APPENDECTOMY    . BACK SURGERY     . CARDIAC CATHETERIZATION  1980s; 2012;  . CATARACT EXTRACTION Left 2004  . COLONOSCOPY  2004   FLEISHMAN TICS  . CORONARY ANGIOPLASTY WITH STENT PLACEMENT  05/18/2017   "3 stents"  . CORONARY STENT INTERVENTION N/A 05/18/2017   Procedure: CORONARY STENT INTERVENTION;  Surgeon: Jettie Booze, MD;  Location: [facility];  Service: Cardiovascular;  Laterality: N/A;  . ESOPHAGOGASTRODUODENOSCOPY (EGD) WITH PROPOFOL N/A 11/20/2017   Procedure: ESOPHAGOGASTRODUODENOSCOPY (EGD) WITH PROPOFOL;  Surgeon: Irene Shipper, MD;  Location: [facility];  Service: Gastroenterology;  Laterality: N/A;  . JOINT REPLACEMENT    . LEFT HEART CATH AND CORONARY ANGIOGRAPHY N/A 05/18/2017   Procedure: LEFT HEART CATH AND CORONARY ANGIOGRAPHY;  Surgeon: Jettie Booze, MD;  Location: [facility];  Service: Cardiovascular;  Laterality: N/A;  . LOWER EXTREMITY ANGIOGRAM Right 09/20/2017   Procedure: RIGHT LOWER LEG  ANGIOGRAM;  Surgeon: Serafina Mitchell, MD;  Location: [facility];  Service: Vascular;  Laterality: Right;  . LUMBAR FUSION  2002   L3, 4 L4, 5 L5 S1 Fused by Dr. Glenna Fellows  . PERIPHERAL VASCULAR BALLOON ANGIOPLASTY Left 09/15/2017   Procedure: PERIPHERAL VASCULAR BALLOON ANGIOPLASTY;  Surgeon: Serafina Mitchell, MD;  Location: [facility];  Service: Cardiovascular;  Laterality: Left;  PTA of Peroneal & Posterior Tibial  . POSTERIOR LUMBAR FUSION    . RHINOPLASTY    . RIGHT HEART CATH N/A 05/18/2017   Procedure: RIGHT HEART CATH;  Surgeon: Jettie Booze, MD;  Location: [facility];  Service: Cardiovascular;  Laterality: N/A;  . TEE WITHOUT CARDIOVERSION N/A 09/18/2017   Procedure: TRANSESOPHAGEAL ECHOCARDIOGRAM (TEE);  Surgeon: Fay Records, MD;  Location: [place];  Service: Cardiovascular;  Laterality: N/A;  . TOTAL KNEE ARTHROPLASTY Bilateral   . TRANSURETHRAL RESECTION OF PROSTATE  2001   [place] Medications:  Prior to Admission medications     Medication Sig Start Date End Date Taking? Authorizing Provider  acetaminophen (TYLENOL) 500 MG tablet Take 500 mg by mouth as needed for mild pain or headache.   Yes [provider]  albuterol (PROVENTIL) (2.5 MG/3ML) 0.083% nebulizer solution Inhale 3 mLs into the lungs every 4 (four) hours as needed for wheezing or shortness of breath. 05/20/17  Yes Daune Perch, NP  amLODipine (NORVASC) 10 MG tablet Take 1 tablet (10 mg total) by mouth daily. 06/05/17  Yes Arnoldo Lenis, MD  atorvastatin (LIPITOR) 80 MG tablet Take 1 tablet (80 mg total) by mouth every evening. 05/19/17  Yes Dunn, Nedra Hai, PA-C  Cholecalciferol (D3-1000 PO) Take 1,000 Units by mouth daily.    Yes [provider]  clopidogrel (PLAVIX) 75 MG tablet Take 1 tablet (75 mg total) by mouth daily. 05/19/17  Yes Dunn, Dayna N, PA-C  collagenase (SANTYL) ointment Apply 1 application topically daily. 11/13/17  Yes Serafina Mitchell, MD  Cyanocobalamin (B-12 PO) Take 1 tablet by mouth daily.    Yes [provider]  finasteride (PROSCAR) 5 MG tablet Take 5 mg by mouth daily.  04/17/15  Yes [provider]  furosemide (LASIX) 20 MG tablet TAKE 1 TABLET DAILY AS NEEDED Patient taking differently: Take 20 mg by mouth daily.  08/15/17  Yes BranchAlphonse Guild, MD  insulin glargine (LANTUS) 100 UNIT/ML injection Inject 30-40 Units into the skin See admin instructions. Inject 40 units SQ in the morning and inject 30 units SQ at bedtime   Yes [provider]  metFORMIN (GLUCOPHAGE) 1000 MG tablet Take 1,000 mg by mouth 2 (two) times daily. 05/22/17  Yes [provider]  metoprolol succinate (TOPROL-XL) 100 MG 24 hr tablet TAKE 1/2 TABLET EVERY DAY Patient taking differently: Take 100 mg by mouth daily.  05/24/17  Yes Branch, Alphonse Guild, MD  pantoprazole (PROTONIX) 40 MG tablet Take 1 tablet (40 mg total) by mouth daily. To protect stomach while taking multiple blood thinners. 11/22/17  Yes Cheryln Manly, NP  sulfamethoxazole-trimethoprim (BACTRIM,SEPTRA) 400-80 MG tablet Take 1 tablet by mouth 2 (two) times daily. 11/13/17  Yes Serafina Mitchell, MD  traZODone (DESYREL) 100 MG tablet Take 100 mg by mouth at bedtime.  03/25/16  Yes [provider]  apixaban (ELIQUIS) 5 MG TABS tablet Take 1 tablet (5 mg total) by mouth 2 (two) times daily. 11/23/15   Arnoldo Lenis, MD  meclizine (ANTIVERT) 12.5 MG tablet Take 1 tablet (12.5 mg total) by mouth 3 (three) times daily as needed for dizziness. 05/30/17   Richardson Dopp T, PA-C  nitroGLYCERIN (NITROSTAT) 0.4 MG SL tablet PLACE 1 TABLET (0.4 MG TOTAL) UNDER THE TONGUE EVERY 5  MINUTES AS NEEDED FOR CHEST PAIN. Patient taking differently: Place 0.4 mg under the tongue every 5 (five) minutes as needed.  05/22/17   Arnoldo Lenis, MD  psyllium (METAMUCIL) 58.6 % packet Take 1 packet by mouth daily.    [provider]    Inpatient Medications: Scheduled Meds: . amLODipine  10 mg Oral Daily  . atorvastatin  80 mg Oral QPM  . clopidogrel  75 mg Oral Daily  . finasteride  5 mg Oral Daily  . furosemide  20 mg Oral Daily  . insulin aspart  0-15 Units Subcutaneous TID WC  . insulin aspart  0-5 Units Subcutaneous QHS  . insulin aspart  4 Units Subcutaneous TID WC  . insulin glargine  30 Units Subcutaneous QHS  . insulin glargine  40 Units Subcutaneous Daily  . metoprolol succinate  100 mg Oral Daily  . pantoprazole  40 mg Oral Daily  . sodium chloride flush  3 mL Intravenous Q12H  . traZODone  100 mg Oral QHS   Continuous Infusions: . sodium chloride    . sodium chloride 75 mL/hr at 12/03/17 0500   PRN Meds: sodium chloride, acetaminophen **OR** acetaminophen, albuterol, meclizine, nitroGLYCERIN, ondansetron **OR** ondansetron (ZOFRAN) IV, senna-docusate, sodium chloride flush  Allergies:    Allergies  Allergen Reactions  . Codeine Shortness Of Breath  . Heparin Other (See Comments)    +HIT,  Severe bleeding (with  heparin drip &  large doses)  . Losartan Swelling    Per ENT UNSPECIFIED REACTION   . Other Other (See Comments)    Severe bleeding UNSPECIFIED AGENT   . Oxycodone Other (See Comments)    "Made me act out of my mind" Other reaction(s): Other (See Comments) Mental status changes hallucinations    Social History:   Social History   Socioeconomic History  . Marital status: Married    Spouse name: Not on file  . Number of children: Not on file  . Years of education: Not on file  . Highest education level: Not on file  Occupational History  . Not on file  Social Needs  . Financial resource strain: Not on file  . Food insecurity:    Worry: Not on file    Inability: Not on file  . Transportation needs:    Medical: Not on file    Non-medical: Not on file  Tobacco Use  . Smoking status: Former Smoker    Packs/day: 2.00    Years: 30.00    Pack years: 60.00    Types: Cigarettes    Start date: 03/28/1953    Last attempt to quit: 03/29/1983    Years since quitting: 34.7  . Smokeless tobacco: Never Used  Substance and Sexual Activity  . Alcohol use: Not Currently    Comment: 05/18/2017 'I drink a beer q yr"  . Drug use: No  . Sexual activity: Not on file  Lifestyle  . Physical activity:    Days per week: Not on file    Minutes per session: Not on file  . Stress: Not on file  Relationships  . Social connections:    Talks on phone: Not on file    Gets together: Not on file    Attends religious service: Not on file    Active member of club or organization: Not on file    Attends meetings of clubs or organizations: Not on file    Relationship status: Not on file  . Intimate partner violence:    Fear of current or ex partner: Not on file    Emotionally abused: Not on file    Physically abused: Not on file    Forced sexual activity: Not on file  Other Topics Concern  . Not on file  Social History Narrative  . Not on file    Family History:    Family History  Problem  Relation Age of Onset  . Heart attack Father 41  . COPD Father   . COPD Mother   . Heart disease Mother   . Diabetes Mother   . Hypertension Sister   . CVA Sister   . Diabetes Sister   . Multiple sclerosis Sister      ROS:  Please see the history of present illness.  General:no colds or fevers, no weight changes Skin:no rashes or ulcers HEENT:no blurred vision, no congestion CV:see HPI PUL:see HPI GI:no diarrhea constipation or melena, no indigestion GU:no hematuria, no dysuria MS:no joint pain, no claudication Neuro:no syncope, no lightheadedness Endo:+ diabetes  currently elevated glucose, + thyroid disease  All other ROS reviewed and negative.     Physical Exam/Data:   Vitals:   12/02/17 1940 12/02/17 2133 12/03/17 0344 12/03/17 0715  BP: 119/70 123/66 121/73 (!) 141/72  Pulse: 91 85 80 84  Resp:  20 20 18   Temp:  97.8 F (36.6 C) 97.6 F (36.4 C)   TempSrc:  Oral Oral   SpO2:  95% 97% 94%  Weight:   93.4 kg   Height:        Intake/Output Summary (Last 24 hours) at 12/03/2017 1003 Last data filed at 12/03/2017 0800 Gross per 24 hour  Intake 1860.35 ml  Output 1300 ml  Net 560.35 ml   Filed Weights   12/02/17 0927 12/02/17 1809 12/03/17 0344  Weight: 93 kg 92.2 kg 93.4 kg   Body mass index is 32.23 kg/m.  General:  Well nourished, well developed, in no acute distress, but breathless with conversation.  HEENT: normal Lymph: no adenopathy Neck: no JVD Endocrine:  No thryomegaly Vascular: No carotid bruits; pedal pulses 1+ bilaterally   Cardiac:  normal S1, S2; RRR; 3/6 systolic murmur, no gallup or rub Lungs:  clear to auscultation bilaterally, no wheezing, rhonchi or rales  Abd: soft, nontender, no hepatomegaly  Ext: no edema Musculoskeletal:  No deformities, BUE and BLE strength normal and equal Skin: warm and dry  Neuro:  CNs 2-12 intact, no focal abnormalities noted Psych:  Normal affect   Relevant CV Studies:  TEE 09/18/17 (done for bacteremia)   MV normal   Trace MR TV normal   MIld TR AV is mildly thickened, calcified   NO AI   No definite vegetation though cannot exclude colonization PV normal  LVEF and RVEF normal  No PFO by color doppler or with injection of agitated saline   LA, LAA without masses   Spontaneous contrast seen in LA  MIld fixed plaquing of thoracic aorta.  Echo 09/15/17 Study Conclusions  - Left ventricle: The cavity size was normal. Wall thickness was   increased in a pattern of moderate LVH. Systolic function was   normal. The estimated ejection fraction was in the range of 55%   to 60%. Wall motion was normal; there were no regional wall   motion abnormalities. - Aortic valve: Trileaflet; moderately thickened, moderately   calcified leaflets. Valve mobility was restricted. There was mild   stenosis. There was mild regurgitation. Peak velocity (S): 277   cm/s. Mean gradient (S): 16 mm Hg. Valve area (VTI): 2.01 cm^2.   Valve area (Vmax): 1.8 cm^2. Valve area (Vmean): 1.73 cm^2. - Mitral valve: There was mild regurgitation. Valve area by   pressure half-time: 0.93 cm^2. - Pulmonary arteries: Systolic pressure was mildly increased. PA   peak pressure: 44 mm Hg (S).   Cardiac cath 05/18/17  Dist RCA lesion is 40% stenosed.  Prox RCA lesion is 25% stenosed.  1st Diag lesion is 75% stenosed.  A drug-eluting stent was successfully placed using a STENT SYNERGY DES 2.25X12.  Post intervention, there is a 0% residual stenosis.  Prox LAD to Mid LAD lesion is 75% stenosed.  A drug-eluting stent was successfully placed using a STENT SYNERGY DES 3.5X38.  Post intervention, there is a 0% residual stenosis.  Prox LAD lesion is 70% stenosed.  A drug-eluting stent was successfully placed using a STENT SYNERGY DES 4X12, overlapping the prior stent.  Post intervention, there is a 0% residual stenosis.  The left ventricular systolic function is normal.  The left ventricular ejection fraction  is 55-65% by visual estimate.  LV end diastolic pressure is normal.  There is no aortic valve stenosis.  Ao sat 98%, PA sat 65%; PA mean 25 mm Hg; unable to wedge the catheter.  Normal right heat pressures.  Tortuous right subclavian making catheter navigation difficult from the right radial approach.   Continue dual antiplatelet therapy along with Eliquis for 30 days.  Restart Eliquis  tomorrow.  After 30 days, stop aspirin.  Continue Plavix and Eliquis.   Laboratory Data:  Chemistry Recent Labs  Lab 12/02/17 0950 12/03/17 0635  NA 134* 134*  K 4.2 4.8  CL 102 104  CO2 22 18*  GLUCOSE 123* 277*  BUN 30* 31*  CREATININE 1.35* 1.23  CALCIUM 8.2* 8.3*  GFRNONAA 47* 53*  GFRAA 55* >60  ANIONGAP 10 12    Recent Labs  Lab 12/02/17 0950  PROT 6.1*  ALBUMIN 3.1*  AST 41  ALT 21  ALKPHOS 97  BILITOT 1.0   Hematology Recent Labs  Lab 12/02/17 0950 12/03/17 0635  WBC 1.7* 1.8*  RBC 4.04* 3.97*  HGB 9.9* 9.7*  HCT 30.6* 31.2*  MCV 75.7* 78.6  MCH 24.5* 24.4*  MCHC 32.4 31.1  RDW 19.1* 19.5*  PLT 113* 100*   Cardiac Enzymes Recent Labs  Lab 12/02/17 0950 12/02/17 1430 12/02/17 1811 12/02/17 2350 12/03/17 0635  TROPONINI <0.03 <0.03 <0.03 <0.03 <0.03   No results for input(s): TROPIPOC in the last 168 hours.  BNP Recent Labs  Lab 12/02/17 0950  BNP 180.0*    DDimer No results for input(s): DDIMER in the last 168 hours.  Radiology/Studies:  Dg Ribs Unilateral W/chest Right  Result Date: 12/02/2017 CLINICAL DATA:  Progressive cough. Shortness of breath. Right-sided chest pain. Fall 2 weeks ago with right anterior rib pain. EXAM: RIGHT RIBS AND CHEST - 3+ VIEW COMPARISON:  11/17/2017 FINDINGS: Bibasilar subsegmental atelectasis. This is improved at the right lung base compared 11/17/2017. No appreciable pneumothorax or pulmonary contusion. Probable nondisplaced fracture the right anterior seventh rib. Questionable nondisplaced right anterior eighth  rib fracture. No displaced rib fracture is identified. Tortuous thoracic aorta. Heart size within normal limits for projection. IMPRESSION: 1. Subtle possible nondisplaced fractures of the right anterior seventh and eighth ribs. No displaced fracture identified. 2. Bibasilar subsegmental atelectasis, improved at the right lung base compared 11/17/2017. Electronically Signed   By: Van Clines M.D.   On: 12/02/2017 11:00   Ct Angio Chest Pe W/cm &/or Wo Cm  Addendum Date: 12/02/2017   ADDENDUM REPORT: 12/02/2017 13:52 ADDENDUM: Addendum created after further review and discussion with the referring physician. There is nondisplaced rib fracture of the anterior right fourth rib, at the chondral junction. No sternal fracture or left rib fracture is identified. No rib fracture of the rib angles at the seventh and eighth ribs identified, however, a portion of the data set is not included on the axial images given the patient's body habitus. These results were discussed by telephone at the time of interpretation on 12/02/2017 at 1:52 pm with Dr. Nunzio Cory [facility] Electronically Signed   By: Corrie Mckusick D.O.   On: 12/02/2017 13:52   Result Date: 12/02/2017 CLINICAL DATA:  84 year old male with a history of cough EXAM: CT ANGIOGRAPHY CHEST WITH CONTRAST TECHNIQUE: Multidetector CT imaging of the chest was performed using the standard protocol during bolus administration of intravenous contrast. Multiplanar CT image reconstructions and MIPs were obtained to evaluate the vascular anatomy. CONTRAST:  150mL ISOVUE-370 IOPAMIDOL (ISOVUE-370) INJECTION 76% COMPARISON:  CT angio chest 03/24/2010 FINDINGS: Cardiovascular: Heart: Heart size enlarged, unchanged from comparison chest x-ray studies, and new from the CT dated 2011. Dense calcifications/stenting of the left anterior descending coronary artery. Calcifications of the circumflex and right coronary arteries. Minimal calcifications of the mitral annulus and aortic  valve. No pericardial fluid/thickening. Aorta: Diameter of the ascending aorta estimated 3.4 cm beyond the sino-tubular junction. Minimal calcifications of  the aortic arch. Branch vessels are patent. Three vessel arch. Cervical vessels are patent at the neck base. Mild calcifications of the descending aorta. Pulmonary arteries: No central, lobar, segmental, or proximal subsegmental filling defects. Mediastinum/Nodes: Small lymph nodes of the mediastinum. The largest measures approximately 6 mm in the lowest right paratracheal nodal station. Unremarkable course of the thoracic esophagus. Unremarkable thoracic inlet. Lungs/Pleura: Lungs demonstrate diffuse geographic ground-glass, with more confluent regions of hazy opacity in the dependent regions along the fissures and at the posterior lung bases. No pleural effusion. No tree-in-bud opacity. Centrilobular emphysema with developing paraseptal emphysema at the apices. No endotracheal or endobronchial debris. No significant bronchial wall thickening. No interlobular septal thickening. No pneumothorax. Upper Abdomen: No acute. Musculoskeletal: Degenerative changes of the spine. No acute bony abnormality. Review of the MIP images confirms the above findings. . IMPRESSION: Negative for pulmonary emboli. Geographic ground-glass opacity of the lungs is most suggestive of either chronic small airway disease or chronic small vessel disease. Background of developing centrilobular and paraseptal emphysema. Emphysema (ICD10-J43.9). Since the prior CT of 2011, there has been development of cardiomegaly. Native coronary artery disease, with stenting/calcification of the left anterior descending, as well as disease involving circumflex and right coronary arteries. Associated aortic atherosclerosis. Aortic Atherosclerosis (ICD10-I70.0). Electronically Signed: By: Corrie Mckusick D.O. On: 12/02/2017 13:22    Assessment and Plan:   1. Dyspnea and chest pain with desat.  --neg  troponin neg PE  2. CAD with stents in 04/2017 with DES to 1st diag, pLAD to mLAD with DES placed, pLAD with DES placed.  On plavix-- reassuring that troponins neg.  Pain may be from rib fx and /COPD.  3. A fib/flutter currently a fib with RVR, eliquis was stopped last admit due to GI bleed. ? Change the amlodipine to dilt. ? EP eval.   4. HFpEF at 55-60% in June this year.   5. Pancytopenia - hgb is stable.  WBC low and plts previously plts and WBC have been normal 6. DM-insulin dependent. Per IM      For questions or updates, please contact [place] Please consult [email or web address] for contact info under     Signed, Cecilie Kicks, NP  12/03/2017 10:03 AM   History and all data above reviewed.  Patient examined.  I agree with the findings as above.   He presents with vague complaints.  He reports the most profound complaint was that he was acutely weak.  He could not get to bed himself and he could not raise his head when he got in bed.  He was SOB but he has this intermittently.  He had no chest pain yesterday but he did have some today.  However, he has a chronic chest pain pattern requiring occasional SLNTG.  This does not appear to be worse.  He has no cough fevers or chills.   The patient exam reveals COR:   Irregular, distant heart sounds  ,  Lungs: Decreased breath sounds with expiratory upper airway wheezing  ,  Abd: Positive bowel sounds, no rebound no guarding, Ext No edema  .  All available labs, radiology testing, previous records reviewed. Agree with documented assessment and plan.  Chest pain:  Enzymes are negative.  Chest pain has atypical features and also features consistent with a stable angina pattern.  I do not think that invasive cardiac work up is indicated.      Atrial flutter:  I looked back through all of the rhythm strips and the cardiology  progress notes from last month.  I am not convinced that he was ever in sinus rhythm.  Everything looks like atrial flutter.  Notes  mention flutter then a report of sinus on the last days of the hospitalization and there is no mention that he spontaneously converted and the last EKG looks like flutter.  I suspect that he was in flutter when he was discharged.  Regardless the decision was not to use a DOAC because of the recent stent and the anemia requiring transfusion.  I think for now we need to pursue rate control.  The plan was to restart the Elquis in one month which would be the end of this month.  Therefore, no plans for DCCV or ablation.   Continue rate control.  Dyspnea: I think that this is multifactorial.  Continue pulmonary treatment.   Leukopenia:  Work up per primary team.  This appears to be new.     Jeneen Rinks Herrick Hartog  12:58 PM  12/03/2017

## 2017-12-03 NOTE — Progress Notes (Signed)
Pt has around 800cc yellow urine in his foley at this time and states that he feels much better and his wheezing has stopped, VSS at this time, will continue to monitor.

## 2017-12-03 NOTE — Progress Notes (Signed)
Pt C/O feeling like he had to void, assisted him to stand at the side of the bed which was unsuccessful, bladder scan performed and was getting anywhere from 940-999cc, call placed to Dr Kennon Holter and order given to place a foley catheter. Pt was very uncomfortable and wheezing at this time, will place the foley and continue to monitor.

## 2017-12-03 NOTE — Progress Notes (Signed)
Blood sugar was 425 and orders written to call for greater than 400. Dr Kennon Holter called and explained that his blood sugar was 200's on admission with no coverage and that he had dinner. He was getting 30 Units of Lantus and coverage was for 5 Units of Regular Insulin but she ordered an additional 2 units which was given with his Lantus, will continue to monitor.

## 2017-12-03 NOTE — Progress Notes (Signed)
Pt continues to wheeze post breathing tx. Pt has small productive cough. O2 sat 95% on 2L. Md on call made aware. New order received for IV lasix and to stop fluids. Will cont to monitor pt.

## 2017-12-04 DIAGNOSIS — R0600 Dyspnea, unspecified: Secondary | ICD-10-CM

## 2017-12-04 DIAGNOSIS — I4892 Unspecified atrial flutter: Secondary | ICD-10-CM

## 2017-12-04 DIAGNOSIS — S2241XA Multiple fractures of ribs, right side, initial encounter for closed fracture: Secondary | ICD-10-CM

## 2017-12-04 LAB — URINE CULTURE: CULTURE: NO GROWTH

## 2017-12-04 LAB — CBC
HCT: 32.9 % — ABNORMAL LOW (ref 39.0–52.0)
Hemoglobin: 10.6 g/dL — ABNORMAL LOW (ref 13.0–17.0)
MCH: 24.5 pg — ABNORMAL LOW (ref 26.0–34.0)
MCHC: 32.2 g/dL (ref 30.0–36.0)
MCV: 76.2 fL — AB (ref 78.0–100.0)
PLATELETS: 77 10*3/uL — AB (ref 150–400)
RBC: 4.32 MIL/uL (ref 4.22–5.81)
RDW: 19.6 % — ABNORMAL HIGH (ref 11.5–15.5)
WBC: 1.5 10*3/uL — ABNORMAL LOW (ref 4.0–10.5)

## 2017-12-04 LAB — COMPREHENSIVE METABOLIC PANEL
ALT: 19 U/L (ref 0–44)
ANION GAP: 12 (ref 5–15)
AST: 42 U/L — ABNORMAL HIGH (ref 15–41)
Albumin: 2.6 g/dL — ABNORMAL LOW (ref 3.5–5.0)
Alkaline Phosphatase: 89 U/L (ref 38–126)
BUN: 26 mg/dL — ABNORMAL HIGH (ref 8–23)
CALCIUM: 8.1 mg/dL — AB (ref 8.9–10.3)
CHLORIDE: 101 mmol/L (ref 98–111)
CO2: 24 mmol/L (ref 22–32)
CREATININE: 1.18 mg/dL (ref 0.61–1.24)
GFR calc Af Amer: >60 mL/min (ref >60)
GFR, EST NON AFRICAN AMERICAN: 56 mL/min — AB (ref >60)
Glucose, Bld: 98 mg/dL (ref 70–99)
Potassium: 3.4 mmol/L — ABNORMAL LOW (ref 3.5–5.1)
Sodium: 137 mmol/L (ref 135–145)
Total Bilirubin: 1.2 mg/dL (ref 0.3–1.2)
Total Protein: 5.8 g/dL — ABNORMAL LOW (ref 6.5–8.1)

## 2017-12-04 LAB — GLUCOSE, CAPILLARY
GLUCOSE-CAPILLARY: 119 mg/dL — AB (ref 70–99)
GLUCOSE-CAPILLARY: 139 mg/dL — AB (ref 70–99)
Glucose-Capillary: 116 mg/dL — ABNORMAL HIGH (ref 70–99)
Glucose-Capillary: 92 mg/dL (ref 70–99)

## 2017-12-04 LAB — SAVE SMEAR

## 2017-12-04 MED ORDER — FOLIC ACID 1 MG PO TABS
1.0000 mg | ORAL_TABLET | Freq: Every day | ORAL | Status: DC
  Administered 2017-12-04 – 2017-12-05 (×2): 1 mg via ORAL
  Filled 2017-12-04 (×2): qty 1

## 2017-12-04 MED ORDER — POTASSIUM CHLORIDE CRYS ER 20 MEQ PO TBCR
40.0000 meq | EXTENDED_RELEASE_TABLET | Freq: Once | ORAL | Status: AC
  Administered 2017-12-04: 40 meq via ORAL
  Filled 2017-12-04: qty 2

## 2017-12-04 MED ORDER — FUROSEMIDE 10 MG/ML IJ SOLN
40.0000 mg | Freq: Two times a day (BID) | INTRAMUSCULAR | Status: AC
  Administered 2017-12-04 – 2017-12-05 (×2): 40 mg via INTRAVENOUS
  Filled 2017-12-04 (×2): qty 4

## 2017-12-04 MED ORDER — COLLAGENASE 250 UNIT/GM EX OINT
TOPICAL_OINTMENT | Freq: Every day | CUTANEOUS | Status: DC
  Administered 2017-12-04 – 2017-12-05 (×2): via TOPICAL
  Filled 2017-12-04: qty 30

## 2017-12-04 MED ORDER — ACETAMINOPHEN 500 MG PO TABS
1000.0000 mg | ORAL_TABLET | Freq: Three times a day (TID) | ORAL | Status: DC
  Administered 2017-12-04 – 2017-12-05 (×4): 1000 mg via ORAL
  Filled 2017-12-04 (×4): qty 2

## 2017-12-04 MED ORDER — FERROUS SULFATE 325 (65 FE) MG PO TABS
325.0000 mg | ORAL_TABLET | Freq: Every day | ORAL | Status: DC
  Filled 2017-12-04 (×2): qty 1

## 2017-12-04 NOTE — Progress Notes (Signed)
[facility]  PROGRESS NOTE  Mike Wright YCX:[id number] DOB: Dec 06, 1933 DOA: 12/02/2017 PCP: [facility]   Brief HPI:   84 year old male with history of multiple medical comorbidities significant for CAD status post LAD, stenting x2 in February 2019, COPD, hypothyroidism, atrial fibrillation, taken off anticoagulation due to anemia/GI bleed, hypertension came to hospital with worsening shortness of breath and chest pain.  Chest x-ray showed nondisplaced fracture of the right anterior seventh and eighth rib.  CT negative for PE.  Patient was transferred from [facility] hospital for cardiology evaluation.    Subjective   Patient denies any complaints.  Breathing is improved with IV Lasix.  Diuresed about 5 L yesterday after he was given IV Lasix 40 mg x 1    Assessment/Plan:     1. Dyspnea/chest pain-chest x-ray negative for pneumonia, chest x-ray does show nondisplaced rib fracture of anterior right seventh and eighth rib.  Given significant history of CAD with recent PCI with stenting to LAD x2 cardiology was consulted, no further intervention was recommended.  I feel patient has fluid overload, likely from diastolic heart failure, diuresed 5 L with IV Lasix yesterday.  We will continue with Lasix 40 mg IV every 12 hours.  2. Pancytopenia-unclear etiology, patient had relatively normal labs on 11/21/2017.  The only new medication patient's wife says was Bactrim which was started by podiatrist on 11/13/2017 which she completed on 12/01/2017.  I called and discussed patient's labs with oncologist Dr. Jana Hakim, who recommends to see him in clinic in 3 weeks.  He agrees that likely patient has developed reaction to Bactrim.  We will add sulfa to patient's allergies.  3. Acute on CKD stage II-Baseline creatinine is around 1.1.,  Creatinine on admission was 1.35.  Has improved to 1.18 with diuresis.  4. Atrial fibrillation/flutter-heart rate is controlled, patient was taken off Eliquis  recently due to iron deficiency anemia requiring blood transfusion.  Continue Plavix  5. Diabetes mellitus type 2-glucose controlled, metformin is on hold.  Continue sliding scale insulin with NovoLog, Lantus.  6. Hypertension-blood pressure stable continue home medications.  7. Hyperlipidemia-continue statin     CBG: Recent Labs  Lab 12/03/17 1146 12/03/17 1645 12/03/17 2045 12/04/17 0733 12/04/17 1135  GLUCAP 179* 109* 144* 116* 92    CBC: Recent Labs  Lab 12/02/17 0950 12/03/17 0635 12/04/17 0508  WBC 1.7* 1.8* 1.5*  NEUTROABS 0.7*  --   --   HGB 9.9* 9.7* 10.6*  HCT 30.6* 31.2* 32.9*  MCV 75.7* 78.6 76.2*  PLT 113* 100* 77*    Basic Metabolic Panel: Recent Labs  Lab 12/02/17 0950 12/03/17 0635 12/04/17 0508  NA 134* 134* 137  K 4.2 4.8 3.4*  CL 102 104 101  CO2 22 18* 24  GLUCOSE 123* 277* 98  BUN 30* 31* 26*  CREATININE 1.35* 1.23 1.18  CALCIUM 8.2* 8.3* 8.1*     DVT prophylaxis: SCDs  Code Status: Full code  Family Communication: No family at bedside  Disposition Plan: likely home when medically ready for discharge   Consultants:  Cardiology  Procedures:  None   Antibiotics:   Anti-infectives (From admission, onward)   None       Objective   Vitals:   12/03/17 1641 12/03/17 2002 12/03/17 2025 12/04/17 0552  BP: (!) 137/103 (!) 123/103 (!) 123/103 105/67  Pulse: 79 78 82 66  Resp:  (!) 24  18  Temp: 99.8 F (37.7 C) 99.6 F (37.6 C)  98 F (36.7 C)  TempSrc: Oral Oral  Oral  SpO2: 95% 94%  96%  Weight:    90.9 kg  Height:        Intake/Output Summary (Last 24 hours) at 12/04/2017 1316 Last data filed at 12/04/2017 0900 Gross per 24 hour  Intake 660 ml  Output 4300 ml  Net -3640 ml   Filed Weights   12/02/17 1809 12/03/17 0344 12/04/17 0552  Weight: 92.2 kg 93.4 kg 90.9 kg     Physical Examination:   Eyes: No icterus, extraocular muscles intact  Mouth: Oral mucosa is moist, no lesions on palate,  Neck:  Supple, no deformities, masses, or tenderness Lungs: Normal respiratory effort,  Heart: Regular rate and rhythm, S1 and S2 normal, no murmurs, rubs auscultated Abdomen: BS normoactive,soft,nondistended,non-tender to palpation,no organomegaly Extremities: No pretibial edema, no erythema, no cyanosis, no clubbing Neuro : Alert and oriented to time, place and person, No focal deficits Skin: No rashes seen on exam    Data Reviewed: I have personally reviewed following labs and imaging studies   Recent Results (from the past 240 hour(s))  Culture, blood (routine x 2)     Status: None (Preliminary result)   Collection Time: 12/02/17 11:06 AM  Result Value Ref Range Status   Specimen Description RIGHT ANTECUBITAL  Final   Special Requests   Final    BOTTLES DRAWN AEROBIC AND ANAEROBIC Blood Culture adequate volume   Culture   Final    NO GROWTH 2 DAYS Performed at [facility], [address] [place], [place] [address]    Report Status PENDING  Incomplete  Culture, blood (routine x 2)     Status: None (Preliminary result)   Collection Time: 12/02/17 11:10 AM  Result Value Ref Range Status   Specimen Description BLOOD RIGHT HAND  Final   Special Requests   Final    BOTTLES DRAWN AEROBIC AND ANAEROBIC Blood Culture adequate volume   Culture   Final    NO GROWTH 2 DAYS Performed at [facility], [address], [place], [place] [address]    Report Status PENDING  Incomplete  Urine culture     Status: None   Collection Time: 12/02/17  1:43 PM  Result Value Ref Range Status   Specimen Description   Final    URINE, CATHETERIZED Performed at [facility], [address], [place], [place] [address]    Special Requests   Final    NONE Performed at [facility], [address], [place], [place] [address]    Culture   Final    NO GROWTH Performed at [facility], [place] [address] [place], [place] [address]    Report Status 12/04/2017 FINAL  Final     Liver Function  Tests: Recent Labs  Lab 12/02/17 0950 12/04/17 0508  AST 41 42*  ALT 21 19  ALKPHOS 97 89  BILITOT 1.0 1.2  PROT 6.1* 5.8*  ALBUMIN 3.1* 2.6*   Recent Labs  Lab 12/02/17 0950  LIPASE 29   No results for input(s): AMMONIA in the last 168 hours.  Cardiac Enzymes: Recent Labs  Lab 12/02/17 0950 12/02/17 1430 12/02/17 1811 12/02/17 2350 12/03/17 0635  TROPONINI <0.03 <0.03 <0.03 <0.03 <0.03   BNP (last 3 results) Recent Labs    05/17/17 1530 11/17/17 1548 12/02/17 0950  BNP 318.5* 192.0* 180.0*    ProBNP (last 3 results) No results for input(s): PROBNP in the last 8760 hours.    Studies: No results found.  Scheduled Meds: . acetaminophen  1,000 mg Oral Q8H  . amLODipine  10 mg Oral Daily  . atorvastatin  80 mg Oral QPM  . clopidogrel  75 mg Oral Daily  . collagenase   Topical Daily  . finasteride  5 mg Oral Daily  . furosemide  40 mg Intravenous Q12H  . insulin aspart  0-15 Units Subcutaneous TID WC  . insulin aspart  0-5 Units Subcutaneous QHS  . insulin aspart  4 Units Subcutaneous TID WC  . insulin glargine  30 Units Subcutaneous QHS  . insulin glargine  40 Units Subcutaneous Daily  . metoprolol succinate  100 mg Oral Daily  . pantoprazole  40 mg Oral Daily  . sodium chloride flush  3 mL Intravenous Q12H  . traZODone  100 mg Oral QHS      Time spent: 25 min  [facility] Pager [phone]. If 7PM-7AM, please contact night-coverage at [email or web address], Office  [phone]  password [email or web address]  12/04/2017, 1:16 PM  LOS: 2 days

## 2017-12-04 NOTE — Care Management Note (Addendum)
Case Management Note  Patient Details  Name: Mike Wright MRN: [id number] Date of Birth: 04-Jul-1933  Subjective/Objective:  Pt presented for Dyspnea. PTA from home with the support of wife. Plan will be to return home with [facility]-Previously active with [facility]. No DME needs identified at the time of visit.                       Action/Plan: Dan with [facility] is aware that patient is hospitalized. Pt will need resumption HH RN orders and F2F. Wife to provide transportation home. No further needs from CM at this time.  Expected Discharge Date:                  Expected Discharge Plan:  [name]  In-House Referral:  NA  Discharge planning Services  CM Consult  Post Acute Care Choice:  Home Health, Resumption of Svcs/PTA Provider, Durable Medical Equipment Choice offered to:  Patient  DME Arranged:  O[facility] DME Agency:  [place] Arranged:  RN [facility] Agency:  [place]  Status of Service:  Completed, signed off  If discussed at [place] of Stay Meetings, dates discussed:    Additional Comments: 1506 12-05-17 Jacqlyn Krauss, RN,BSN [phone] CM did speak with patient in regards to DME 02. Patient is agreeable to home 02. Patient wants to use [facility] for DME since active with services. CM gave referral to [facility] with [facility]. DME 02 will be delivered to room prior to transition home. No further needs from CM at this time.  Bethena Roys, RN 12/04/2017, 12:05 PM

## 2017-12-04 NOTE — Progress Notes (Signed)
[facility] will sign off.   Medication Recommendations:  Continue current medications Other recommendations (labs, testing, etc):  No further cardiac testing Follow up as an outpatient:  We will arrange.   Ena Dawley, MD 12/04/2017

## 2017-12-04 NOTE — Progress Notes (Signed)
COURTESY NOTE: Called by Dr Darrick Meigs to request outpatient consult on this 84 y/o [place] man with new cytopenias following recent therapy with Septra. I suggested a smear review, will also start patient on folate daily. Note also drop in MCV, despite ferritin in the low-normal range recently; iron saturation is low. He will benefit from iron supplementation in addition and that has been written.  An appointment in 3 weeks or so with one of out cancer center MDs is in process of being scheduled; the patient will be directly contacted.  Let me know if I can be of further help at this point.

## 2017-12-04 NOTE — Progress Notes (Signed)
   12/04/17 1400  Clinical Encounter Type  Visited With Patient and family together  Visit Type Follow-up  Chaplain returned to Pt. Room to complete AD. Pt already filled in his portion and the AD was signed and notarized.  Chaplain made copy to give to unit clerk and gave the original to patient. Patient expressed appreciation for having his AD updated during his hospital stay.

## 2017-12-04 NOTE — Progress Notes (Signed)
   12/04/17 1200  Clinical Encounter Type  Visited With Patient and family together  Visit Type Other (Comment) (AD)  Referral From Nurse  Responded to consult for AD. Patient was smiling and eager to talk. Patient informed Chaplain that he wished to update is AD. Chaplain gave Pt. the forms to fill in. Chaplain told patient that she will come back in 1 hour with notary and two witness. Patient agreed and said he will be ready. Patient wife was present at bedside. Chaplain provided emotional support to patient and his wife. Very pleasant visit!

## 2017-12-04 NOTE — Consult Note (Signed)
[place] Nurse wound consult note Reason for Consult:Bilateral great toe amputation sites.  Current topical therapy is Santyl enzymatic debridement and this is still appropriate.  Wound type:surgical Pressure Injury POA: NA Measurement:Left foot:  Great toe amputations site: Left  0.4 cm x 7 cm with tunneling at proximal and distal end, extends 2 cm  Right great toe site:  0.4 cm x 5.8 cm x 0.5 cm  Wound QSY:HNPMVA and 100% fibrin slough Drainage (amount, consistency, odor) moderate serosanguinous and musty odor Periwound:intact Dressing procedure/placement/frequency:CLeanse bilateral feet with soap and water and pat dry.  Santyl ointment to wound bed.  Gently fill dead space with NS moist packing strip.  Cover with dry gauze and tape.  Change daily.  Will not follow at this time.  Please re-consult if needed.  Domenic Moras RN BSN [place] Pager [phone]

## 2017-12-05 ENCOUNTER — Telehealth: Payer: Self-pay | Admitting: Oncology

## 2017-12-05 DIAGNOSIS — Z23 Encounter for immunization: Secondary | ICD-10-CM | POA: Diagnosis not present

## 2017-12-05 DIAGNOSIS — J441 Chronic obstructive pulmonary disease with (acute) exacerbation: Secondary | ICD-10-CM

## 2017-12-05 DIAGNOSIS — I480 Paroxysmal atrial fibrillation: Secondary | ICD-10-CM

## 2017-12-05 LAB — GLUCOSE, CAPILLARY
GLUCOSE-CAPILLARY: 95 mg/dL (ref 70–99)
GLUCOSE-CAPILLARY: 115 mg/dL — AB (ref 70–99)

## 2017-12-05 LAB — CBC
HCT: 32.7 % — ABNORMAL LOW (ref 39.0–52.0)
HEMOGLOBIN: 10.5 g/dL — AB (ref 13.0–17.0)
MCH: 24.4 pg — ABNORMAL LOW (ref 26.0–34.0)
MCHC: 32.1 g/dL (ref 30.0–36.0)
MCV: 76 fL — ABNORMAL LOW (ref 78.0–100.0)
Platelets: 77 10*3/uL — ABNORMAL LOW (ref 150–400)
RBC: 4.3 MIL/uL (ref 4.22–5.81)
RDW: 19.7 % — ABNORMAL HIGH (ref 11.5–15.5)
WBC: 1.7 10*3/uL — AB (ref 4.0–10.5)

## 2017-12-05 LAB — BASIC METABOLIC PANEL
ANION GAP: 13 (ref 5–15)
BUN: 25 mg/dL — ABNORMAL HIGH (ref 8–23)
CALCIUM: 8.1 mg/dL — AB (ref 8.9–10.3)
CO2: 26 mmol/L (ref 22–32)
Chloride: 96 mmol/L — ABNORMAL LOW (ref 98–111)
Creatinine, Ser: 1.22 mg/dL (ref 0.61–1.24)
GFR, EST NON AFRICAN AMERICAN: 53 mL/min — AB (ref >60)
Glucose, Bld: 99 mg/dL (ref 70–99)
POTASSIUM: 3.6 mmol/L (ref 3.5–5.1)
SODIUM: 135 mmol/L (ref 135–145)

## 2017-12-05 LAB — PATHOLOGIST SMEAR REVIEW

## 2017-12-05 MED ORDER — FERROUS SULFATE 325 (65 FE) MG PO TABS: 325.0000 mg | ORAL_TABLET | Freq: Every day | ORAL | 3 refills | Status: DC

## 2017-12-05 MED ORDER — FOLIC ACID 1 MG PO TABS: 1.0000 mg | ORAL_TABLET | Freq: Every day | ORAL | 1 refills | Status: DC

## 2017-12-05 MED ORDER — METOPROLOL SUCCINATE ER 100 MG PO TB24: 100.0000 mg | ORAL_TABLET | Freq: Every day | ORAL | 1 refills | Status: DC

## 2017-12-05 MED ORDER — FUROSEMIDE 40 MG PO TABS: 40.0000 mg | ORAL_TABLET | Freq: Every day | ORAL | 2 refills | Status: DC

## 2017-12-05 NOTE — Telephone Encounter (Signed)
Received an email from Dr. Jana Hakim to get the pt setup w/a hematologist. I cld and lft the pt a vm to return my call.   Per Dr. Jana Hakim pt needs to be evaluated for pancytopenia in 3-4 weeks.

## 2017-12-05 NOTE — Care Management Important Message (Signed)
Important Message  Patient Details  Name: Mike Wright MRN: [id number] Date of Birth: [birth year]   Medicare Important Message Given:  Yes    [place] Gudiel P Itzy Adler 12/05/2017, 2:54 PM

## 2017-12-05 NOTE — Discharge Summary (Signed)
Physician Discharge Summary  ARLAND USERY WUJ:[id number] DOB: December 30, 1933 DOA: 12/02/2017  PCP: [facility]  Admit date: 12/02/2017 Discharge date: 12/05/2017  Time spent: 45 minutes minutes  Recommendations for Outpatient Follow-up:  1. Follow-up hematology, Dr. Jana Hakim in 3 weeks 2. Follow-up cardiology, Dr. [name] in 2 weeks    Discharge Diagnoses:  Principal Problem:   Dyspnea Active Problems:   Diabetes [facility])   Essential hypertension   CAD S/P percutaneous coronary angioplasty   Chronic diastolic CHF (congestive heart failure) (HCC)   PAF (paroxysmal atrial fibrillation) (HCC)   Gangrene of bilateral great toes (HCC)   Pancytopenia (HCC)   Paroxysmal atrial flutter ([place])   Discharge Condition: Stable  Diet recommendation: Heart healthy diet  Filed Weights   12/03/17 0344 12/04/17 0552 12/05/17 0528  Weight: 93.4 kg 90.9 kg 90 kg    History of present illness:  84 year old male with history of multiple medical comorbidities significant for CAD status post LAD, stenting x2 in February 2019, COPD, hypothyroidism, atrial fibrillation, taken off anticoagulation due to anemia/GI bleed, hypertension came to hospital with worsening shortness of breath and chest pain.  Chest x-ray showed nondisplaced fracture of the right anterior seventh and eighth rib.  CT negative for PE.  Patient was transferred from [facility] hospital for cardiology evaluation.  Hospital Course:  1. Dyspnea/chest pain-secondary to diastolic heart failure, improved after aggressive IV diuresis.  Patient diuresed about 6 L since admit.  Chest x-ray negative for pneumonia, chest x-ray does show nondisplaced rib fracture of anterior right seventh and eighth rib.  Given significant history of CAD with recent PCI with stenting to LAD x2 cardiology was consulted, no further intervention was recommended.   2. Pancytopenia-unclear etiology, patient had relatively normal labs on 11/21/2017.  The only new  medication patient's wife says was Bactrim which was started by podiatrist on 11/13/2017 which she completed on 12/01/2017.  I called and discussed patient's labs with oncologist Dr. Jana Hakim, who recommends to see him in clinic in 3 weeks.  He agrees that likely patient has developed reaction to Bactrim.  We will add sulfa to patient's allergies.  Peripheral blood smear review done by pathology shows only pancytopenia and no bone marrow involvement at this time.  Patient will follow-up with hematology in 3 weeks for further recommendations.  3. Acute on CKD stage II-Baseline creatinine is around 1.1.,  Creatinine on admission was 1.35.  Has improved to 1.18 with diuresis.  Today creatinine is 1.20  4. Atrial fibrillation/flutter-heart rate is controlled, patient was taken off Eliquis recently due to iron deficiency anemia requiring blood transfusion.  Continue Plavix  5. Diabetes mellitus type 2-glucose controlled, continue metformin, Lantus.    6. Hypertension-blood pressure stable continue home medications.  7. Hyperlipidemia-continue statin  Procedures:  None  Consultations:  Cardiology  Discharge Exam: Vitals:   12/04/17 2143 12/05/17 0528  BP: 112/65 111/90  Pulse: 82 80  Resp:  (!) 28  Temp:  98.5 F (36.9 C)  SpO2:      General: Appears in no acute distress Cardiovascular: S1-S2, regular Respiratory: Clear to auscultation bilaterally  Discharge Instructions   Discharge Instructions    Diet - low sodium heart healthy   Complete by:  As directed    Increase activity slowly   Complete by:  As directed      Allergies as of 12/05/2017      Reactions   Bactrim [sulfamethoxazole-trimethoprim]    Pancytopenia   Codeine Shortness Of Breath   Heparin Other (  See Comments)   +HIT,  Severe bleeding (with heparin drip & large doses)   Losartan Swelling   Per ENT UNSPECIFIED REACTION    Other Other (See Comments)   Severe bleeding UNSPECIFIED AGENT    Oxycodone  Other (See Comments)   "Made me act out of my mind" Other reaction(s): Other (See Comments) Mental status changes hallucinations      Medication List    STOP taking these medications   apixaban 5 MG Tabs tablet Commonly known as:  ELIQUIS   sulfamethoxazole-trimethoprim 400-80 MG tablet Commonly known as:  BACTRIM,SEPTRA     TAKE these medications   acetaminophen 500 MG tablet Commonly known as:  TYLENOL Take 500 mg by mouth as needed for mild pain or headache.   albuterol (2.5 MG/3ML) 0.083% nebulizer solution Commonly known as:  PROVENTIL Inhale 3 mLs into the lungs every 4 (four) hours as needed for wheezing or shortness of breath.   amLODipine 10 MG tablet Commonly known as:  NORVASC Take 1 tablet (10 mg total) by mouth daily.   atorvastatin 80 MG tablet Commonly known as:  LIPITOR Take 1 tablet (80 mg total) by mouth every evening.   B-12 PO Take 1 tablet by mouth daily.   clopidogrel 75 MG tablet Commonly known as:  PLAVIX Take 1 tablet (75 mg total) by mouth daily.   collagenase ointment Commonly known as:  SANTYL Apply 1 application topically daily.   D3-1000 PO Take 1,000 Units by mouth daily.   ferrous sulfate 325 (65 FE) MG tablet Take 1 tablet (325 mg total) by mouth daily. Start taking on:  12/06/2017   finasteride 5 MG tablet Commonly known as:  PROSCAR Take 5 mg by mouth daily.   folic acid 1 MG tablet Commonly known as:  FOLVITE Take 1 tablet (1 mg total) by mouth daily. Start taking on:  12/06/2017   furosemide 40 MG tablet Commonly known as:  LASIX Take 1 tablet (40 mg total) by mouth daily. What changed:    medication strength  how much to take  when to take this  reasons to take this   insulin glargine 100 UNIT/ML injection Commonly known as:  LANTUS Inject 30-40 Units into the skin See admin instructions. Inject 40 units SQ in the morning and inject 30 units SQ at bedtime   meclizine 12.5 MG tablet Commonly known as:   ANTIVERT Take 1 tablet (12.5 mg total) by mouth 3 (three) times daily as needed for dizziness.   metFORMIN 1000 MG tablet Commonly known as:  GLUCOPHAGE Take 1,000 mg by mouth 2 (two) times daily.   metoprolol succinate 100 MG 24 hr tablet Commonly known as:  TOPROL-XL Take 1 tablet (100 mg total) by mouth daily.   nitroGLYCERIN 0.4 MG SL tablet Commonly known as:  NITROSTAT PLACE 1 TABLET (0.4 MG TOTAL) UNDER THE TONGUE EVERY 5  MINUTES AS NEEDED FOR CHEST PAIN. What changed:  See the new instructions.   pantoprazole 40 MG tablet Commonly known as:  PROTONIX Take 1 tablet (40 mg total) by mouth daily. To protect stomach while taking multiple blood thinners.   psyllium 58.6 % packet Commonly known as:  METAMUCIL Take 1 packet by mouth daily.   traZODone 100 MG tablet Commonly known as:  DESYREL Take 100 mg by mouth at bedtime.      Allergies  Allergen Reactions  . Bactrim [Sulfamethoxazole-Trimethoprim]     Pancytopenia  . Codeine Shortness Of Breath  . Heparin Other (See Comments)    +  HIT,  Severe bleeding (with heparin drip & large doses)  . Losartan Swelling    Per ENT UNSPECIFIED REACTION   . Other Other (See Comments)    Severe bleeding UNSPECIFIED AGENT   . Oxycodone Other (See Comments)    "Made me act out of my mind" Other reaction(s): Other (See Comments) Mental status changes hallucinations   Follow-up Information    Arnoldo Lenis, MD Follow up on 12/18/2017.   Specialty:  Cardiology Why:  Your follow up appointment will be on 12/18/17 at 820am with Dr. [name].  Contact information: [place] [address] [phone]        [facility] Follow up.   Specialty:  Home Health Services Why:  Registered Nurse Contact information: [place] [address] [phone]        Magrinat, Virgie Dad, MD Follow up in 3 week(s).   Specialty:  Oncology Contact information: [place] [address] [phone]            The results of significant diagnostics from this hospitalization (including imaging, microbiology, ancillary and laboratory) are listed below for reference.    Significant Diagnostic Studies: Dg Chest 2 View  Result Date: 11/17/2017 CLINICAL DATA:  Shortness of breath.  Chest pains.  Cough. EXAM: CHEST - 2 VIEW COMPARISON:  09/04/2017. FINDINGS: Mediastinum and hilar structures normal. Mild bibasilar atelectasis/infiltrates. No pleural effusion or pneumothorax. Stable mild cardiomegaly. No acute bony abnormality. IMPRESSION: Mild bibasilar atelectasis/infiltrates. Electronically Signed   By: Marcello Moores  Register   On: 11/17/2017 16:30   Dg Ribs Unilateral W/chest Right  Result Date: 12/02/2017 CLINICAL DATA:  Progressive cough. Shortness of breath. Right-sided chest pain. Fall 2 weeks ago with right anterior rib pain. EXAM: RIGHT RIBS AND CHEST - 3+ VIEW COMPARISON:  11/17/2017 FINDINGS: Bibasilar subsegmental atelectasis. This is improved at the right lung base compared 11/17/2017. No appreciable pneumothorax or pulmonary contusion. Probable nondisplaced fracture the right anterior seventh rib. Questionable nondisplaced right anterior eighth rib fracture. No displaced rib fracture is identified. Tortuous thoracic aorta. Heart size within normal limits for projection. IMPRESSION: 1. Subtle possible nondisplaced fractures of the right anterior seventh and eighth ribs. No displaced fracture identified. 2. Bibasilar subsegmental atelectasis, improved at the right lung base compared 11/17/2017. Electronically Signed   By: Van Clines M.D.   On: 12/02/2017 11:00   Ct Angio Chest Pe W/cm &/or Wo Cm  Addendum Date: 12/02/2017   ADDENDUM REPORT: 12/02/2017 13:52 ADDENDUM: Addendum created after further review and discussion with the referring physician. There is nondisplaced rib fracture of the anterior right fourth rib, at the chondral junction. No  sternal fracture or left rib fracture is identified. No rib fracture of the rib angles at the seventh and eighth ribs identified, however, a portion of the data set is not included on the axial images given the patient's body habitus. These results were discussed by telephone at the time of interpretation on 12/02/2017 at 1:52 pm with Dr. Nunzio Cory [facility] Electronically Signed   By: Corrie Mckusick D.O.   On: 12/02/2017 13:52   Result Date: 12/02/2017 CLINICAL DATA:  84 year old male with a history of cough EXAM: CT ANGIOGRAPHY CHEST WITH CONTRAST TECHNIQUE: Multidetector CT imaging of the chest was performed using the standard protocol during bolus administration of intravenous contrast. Multiplanar CT image reconstructions and MIPs were obtained to evaluate the vascular anatomy. CONTRAST:  142mL ISOVUE-370 IOPAMIDOL (ISOVUE-370) INJECTION 76% COMPARISON:  CT angio  chest 03/24/2010 FINDINGS: Cardiovascular: Heart: Heart size enlarged, unchanged from comparison chest x-ray studies, and new from the CT dated 2011. Dense calcifications/stenting of the left anterior descending coronary artery. Calcifications of the circumflex and right coronary arteries. Minimal calcifications of the mitral annulus and aortic valve. No pericardial fluid/thickening. Aorta: Diameter of the ascending aorta estimated 3.4 cm beyond the sino-tubular junction. Minimal calcifications of the aortic arch. Branch vessels are patent. Three vessel arch. Cervical vessels are patent at the neck base. Mild calcifications of the descending aorta. Pulmonary arteries: No central, lobar, segmental, or proximal subsegmental filling defects. Mediastinum/Nodes: Small lymph nodes of the mediastinum. The largest measures approximately 6 mm in the lowest right paratracheal nodal station. Unremarkable course of the thoracic esophagus. Unremarkable thoracic inlet. Lungs/Pleura: Lungs demonstrate diffuse geographic ground-glass, with more confluent regions of hazy  opacity in the dependent regions along the fissures and at the posterior lung bases. No pleural effusion. No tree-in-bud opacity. Centrilobular emphysema with developing paraseptal emphysema at the apices. No endotracheal or endobronchial debris. No significant bronchial wall thickening. No interlobular septal thickening. No pneumothorax. Upper Abdomen: No acute. Musculoskeletal: Degenerative changes of the spine. No acute bony abnormality. Review of the MIP images confirms the above findings. . IMPRESSION: Negative for pulmonary emboli. Geographic ground-glass opacity of the lungs is most suggestive of either chronic small airway disease or chronic small vessel disease. Background of developing centrilobular and paraseptal emphysema. Emphysema (ICD10-J43.9). Since the prior CT of 2011, there has been development of cardiomegaly. Native coronary artery disease, with stenting/calcification of the left anterior descending, as well as disease involving circumflex and right coronary arteries. Associated aortic atherosclerosis. Aortic Atherosclerosis (ICD10-I70.0). Electronically Signed: By: Corrie Mckusick D.O. On: 12/02/2017 13:22   US Abdomen Complete  Result Date: 11/21/2017 CLINICAL DATA:  Abdominal pain over the last several weeks. EXAM: ABDOMEN ULTRASOUND COMPLETE COMPARISON:  None. FINDINGS: Gallbladder: Several small gallstones, the largest measuring 6 mm. Negative Murphy sign. No wall thickening or surrounding fluid. Common bile duct: Diameter: 3.5 mm.  Normal. Liver: No focal lesion identified. Within normal limits in parenchymal echogenicity. Portal vein is patent on color Doppler imaging with normal direction of blood flow towards the liver. IVC: No abnormality visualized. Pancreas: Poorly seen because of overlying bowel gas. Spleen: Mild splenomegaly.  No focal lesion.  Splenic volume 734 cc Right Kidney: Length: 12.0. Echogenicity within normal limits. No mass or hydronephrosis visualized. Left Kidney:  Length: 11.4. Echogenicity within normal limits. No mass or hydronephrosis visualized. Abdominal aorta: Normal. Other findings: No ascites IMPRESSION: Several small gallstones mobile within the gallbladder. No sonographic evidence of cholecystitis or obstruction. Mild splenomegaly. Electronically Signed   By: Nelson Chimes M.D.   On: 11/21/2017 09:44   Ct Angio Abd/pel W/ And/or W/o  Result Date: 11/22/2017 CLINICAL DATA:  84 year old male with a history of known vascular disease and postprandial pain EXAM: CTA ABDOMEN AND PELVIS wITHOUT AND WITH CONTRAST TECHNIQUE: Multidetector CT imaging of the abdomen and pelvis was performed using the standard protocol during bolus administration of intravenous contrast. Multiplanar reconstructed images and MIPs were obtained and reviewed to evaluate the vascular anatomy. CONTRAST:  179mL ISOVUE-370 IOPAMIDOL (ISOVUE-370) INJECTION 76% COMPARISON:  CT chest 03/24/2010 FINDINGS: VASCULAR Aorta: Mild atherosclerotic changes of the abdominal aorta. No aneurysm or dissection flap. No periaortic fluid. Celiac: Celiac artery patent with no significant stenosis. Mild atherosclerotic changes. Branches are patent. SMA: Superior mesenteric artery is patent with no significant atherosclerotic changes at the origin. Branches are patent. Renals: Bilateral  renal arteries with mild atherosclerotic changes and no significant stenosis. Single renal arteries bilaterally. IMA: Inferior mesenteric artery is patent. Right lower extremity: Mild atherosclerotic changes of the right iliac system with mild tortuosity. Hypogastric artery is patent. Common femoral arteries patent with mild atherosclerotic changes. Proximal SFA and profunda femoris patent. Left lower extremity: Mild atherosclerotic changes of the left iliac system with mild tortuosity. Hypogastric artery is patent. External iliac artery patent without significant stenosis. Common femoral artery patent with mild atherosclerotic changes.  Proximal profunda femoris and SFA patent. Veins: Unremarkable appearance of the veins. Review of the MIP images confirms the above findings. NON-VASCULAR Lower chest: Scarring of the lower lungs with bronchiectasis. No acute finding of the lower chest. Native coronary calcifications. Hepatobiliary: Unremarkable liver . minimal hyperdense material within the gallbladder lumen compatible with cholelithiasis. No pericholecystic fluid. Pancreas: Punctate calcifications throughout the pancreas. Uniform density/enhancement of the pancreatic parenchyma. No peripancreatic inflammatory changes. Spleen: Unremarkable spleen Adrenals/Urinary Tract: Unremarkable appearance of adrenal glands. Right: No hydronephrosis. Symmetric perfusion to the left. No nephrolithiasis. Unremarkable course of the right ureter. Minimal perinephric stranding. Left: No hydronephrosis. There is decreased perfusion at the superior and posterior left renal cortex, compared to the remainder of the renal cortex. No nephrolithiasis. Unremarkable course of the left ureter. Minimal perinephric stranding. Circumferential urinary bladder wall thickening with partial distention of urinary bladder. Stomach/Bowel: Unremarkable appearance of the stomach. Unremarkable appearance of small bowel. No evidence of obstruction. Colonic diverticula without associated inflammatory changes. Mild to moderate stool burden including formed stool within the rectum. No evidence of obstruction. Appendix is not visualized, however, no inflammatory changes are present adjacent to the cecum to indicate an appendicitis. Lymphatic: No lymphadenopathy. Mesenteric: No free fluid or air. No adenopathy. Reproductive: Transverse diameter of the prostate measures 4.2 cm. Other: Bilateral fat containing inguinal hernia. Musculoskeletal: No acute displaced fracture. Surgical changes of the lower lumbar spine. Grade 1 anterolisthesis of L5 on S1. Degenerative changes of the hips. IMPRESSION:  CT angiogram of the abdomen does not support chronic mesenteric ischemia as a source of the patient's postprandial abdominal pain. Decreased perfusion of the left renal cortex at the superior and posterior aspect. This may reflect slow flow given the presence atherosclerosis, though the differential would include both renal infarction as well as renal infection/pyelonephritis. Correlation with lab values may be useful. There is no evidence of high-grade stenosis at the left renal artery origin. Mild atherosclerotic changes of the abdominal aorta with associated mild vascular disease of the bilateral iliac arteries. Incidental imaging of native coronary artery disease. Circumferential urinary bladder wall thickening, potentially chronic thickening or related to cystitis. Moderate to large stool burden, may represent constipation. No evidence of bowel obstruction. Diverticular disease without evidence of acute diverticulitis. Signed, Dulcy Fanny. Dellia Nims, RPVI [facility] Radiology Electronically Signed   By: Corrie Mckusick D.O.   On: 11/22/2017 11:47    Microbiology: Recent Results (from the past 240 hour(s))  Culture, blood (routine x 2)     Status: None (Preliminary result)   Collection Time: 12/02/17 11:06 AM  Result Value Ref Range Status   Specimen Description RIGHT ANTECUBITAL  Final   Special Requests   Final    BOTTLES DRAWN AEROBIC AND ANAEROBIC Blood Culture adequate volume   Culture   Final    NO GROWTH 3 DAYS Performed at [facility], [address] [place], [place] [address]    Report Status PENDING  Incomplete  Culture, blood (routine x  2)     Status: None (Preliminary result)   Collection Time: 12/02/17 11:10 AM  Result Value Ref Range Status   Specimen Description BLOOD RIGHT HAND  Final   Special Requests   Final    BOTTLES DRAWN AEROBIC AND ANAEROBIC Blood Culture adequate volume   Culture   Final    NO GROWTH 3 DAYS Performed  at [facility], [address], [place], [place] [address]    Report Status PENDING  Incomplete  Urine culture     Status: None   Collection Time: 12/02/17  1:43 PM  Result Value Ref Range Status   Specimen Description   Final    URINE, CATHETERIZED Performed at [facility], [address], [place], [place] [address]    Special Requests   Final    NONE Performed at [facility], [address] [place], [place] [address]    Culture   Final    NO GROWTH Performed at [facility], [place] [address] [place], [place] [address]    Report Status 12/04/2017 FINAL  Final     Labs: Basic Metabolic Panel: Recent Labs  Lab 12/02/17 0950 12/03/17 0635 12/04/17 0508 12/05/17 0634  NA 134* 134* 137 135  K 4.2 4.8 3.4* 3.6  CL 102 104 101 96*  CO2 22 18* 24 26  GLUCOSE 123* 277* 98 99  BUN 30* 31* 26* 25*  CREATININE 1.35* 1.23 1.18 1.22  CALCIUM 8.2* 8.3* 8.1* 8.1*   Liver Function Tests: Recent Labs  Lab 12/02/17 0950 12/04/17 0508  AST 41 42*  ALT 21 19  ALKPHOS 97 89  BILITOT 1.0 1.2  PROT 6.1* 5.8*  ALBUMIN 3.1* 2.6*   Recent Labs  Lab 12/02/17 0950  LIPASE 29   No results for input(s): AMMONIA in the last 168 hours. CBC: Recent Labs  Lab 12/02/17 0950 12/03/17 0635 12/04/17 0508 12/05/17 0634  WBC 1.7* 1.8* 1.5* 1.7*  NEUTROABS 0.7*  --   --   --   HGB 9.9* 9.7* 10.6* 10.5*  HCT 30.6* 31.2* 32.9* 32.7*  MCV 75.7* 78.6 76.2* 76.0*  PLT 113* 100* 77* 77*   Cardiac Enzymes: Recent Labs  Lab 12/02/17 0950 12/02/17 1430 12/02/17 1811 12/02/17 2350 12/03/17 0635  TROPONINI <0.03 <0.03 <0.03 <0.03 <0.03   BNP: BNP (last 3 results) Recent Labs    05/17/17 1530 11/17/17 1548 12/02/17 0950  BNP 318.5* 192.0* 180.0*    ProBNP (last 3 results) No results for input(s): PROBNP in the last 8760 hours.  CBG: Recent Labs  Lab 12/04/17 1135 12/04/17 1631 12/04/17 2111 12/05/17 0743 12/05/17 1148  GLUCAP 92 119* 139* 95 115*        Signed:  Oswald Hillock MD.  [facility] 12/05/2017, 12:40 PM

## 2017-12-05 NOTE — Progress Notes (Signed)
Patient and wife received discharge information and acknowledged understanding of it. Patient received printed prescriptions. Patient received oxygen.

## 2017-12-05 NOTE — Progress Notes (Addendum)
SATURATION QUALIFICATIONS: (This note is used to comply with regulatory documentation for home oxygen)  Patient Saturations on Room Air at Rest = 96%  Patient Saturations on Room Air while Ambulating = 85%  Patient Saturations on 2 Liters of oxygen while Ambulating = 95%  Please briefly explain why patient needs home oxygen: 

## 2017-12-06 DIAGNOSIS — E1151 Type 2 diabetes mellitus with diabetic peripheral angiopathy without gangrene: Secondary | ICD-10-CM | POA: Diagnosis not present

## 2017-12-06 DIAGNOSIS — I70262 Atherosclerosis of native arteries of extremities with gangrene, left leg: Secondary | ICD-10-CM | POA: Diagnosis not present

## 2017-12-06 DIAGNOSIS — Z299 Encounter for prophylactic measures, unspecified: Secondary | ICD-10-CM | POA: Diagnosis not present

## 2017-12-06 DIAGNOSIS — I509 Heart failure, unspecified: Secondary | ICD-10-CM | POA: Diagnosis not present

## 2017-12-06 DIAGNOSIS — R7881 Bacteremia: Secondary | ICD-10-CM | POA: Diagnosis not present

## 2017-12-06 DIAGNOSIS — I10 Essential (primary) hypertension: Secondary | ICD-10-CM | POA: Diagnosis not present

## 2017-12-06 DIAGNOSIS — I739 Peripheral vascular disease, unspecified: Secondary | ICD-10-CM | POA: Diagnosis not present

## 2017-12-06 DIAGNOSIS — Z6830 Body mass index (BMI) 30.0-30.9, adult: Secondary | ICD-10-CM | POA: Diagnosis not present

## 2017-12-06 DIAGNOSIS — I70261 Atherosclerosis of native arteries of extremities with gangrene, right leg: Secondary | ICD-10-CM | POA: Diagnosis not present

## 2017-12-06 DIAGNOSIS — Z792 Long term (current) use of antibiotics: Secondary | ICD-10-CM | POA: Diagnosis not present

## 2017-12-06 DIAGNOSIS — Z5181 Encounter for therapeutic drug level monitoring: Secondary | ICD-10-CM | POA: Diagnosis not present

## 2017-12-06 DIAGNOSIS — D649 Anemia, unspecified: Secondary | ICD-10-CM | POA: Diagnosis not present

## 2017-12-06 DIAGNOSIS — E1165 Type 2 diabetes mellitus with hyperglycemia: Secondary | ICD-10-CM | POA: Diagnosis not present

## 2017-12-06 DIAGNOSIS — B9562 Methicillin resistant Staphylococcus aureus infection as the cause of diseases classified elsewhere: Secondary | ICD-10-CM | POA: Diagnosis not present

## 2017-12-06 DIAGNOSIS — Z452 Encounter for adjustment and management of vascular access device: Secondary | ICD-10-CM | POA: Diagnosis not present

## 2017-12-06 DIAGNOSIS — Z4781 Encounter for orthopedic aftercare following surgical amputation: Secondary | ICD-10-CM | POA: Diagnosis not present

## 2017-12-07 LAB — CULTURE, BLOOD (ROUTINE X 2)
CULTURE: NO GROWTH
Culture: NO GROWTH
SPECIAL REQUESTS: ADEQUATE
Special Requests: ADEQUATE

## 2017-12-08 DIAGNOSIS — Z4781 Encounter for orthopedic aftercare following surgical amputation: Secondary | ICD-10-CM | POA: Diagnosis not present

## 2017-12-08 DIAGNOSIS — I70262 Atherosclerosis of native arteries of extremities with gangrene, left leg: Secondary | ICD-10-CM | POA: Diagnosis not present

## 2017-12-08 DIAGNOSIS — I70261 Atherosclerosis of native arteries of extremities with gangrene, right leg: Secondary | ICD-10-CM | POA: Diagnosis not present

## 2017-12-08 DIAGNOSIS — R7881 Bacteremia: Secondary | ICD-10-CM | POA: Diagnosis not present

## 2017-12-08 DIAGNOSIS — B9562 Methicillin resistant Staphylococcus aureus infection as the cause of diseases classified elsewhere: Secondary | ICD-10-CM | POA: Diagnosis not present

## 2017-12-08 DIAGNOSIS — E1151 Type 2 diabetes mellitus with diabetic peripheral angiopathy without gangrene: Secondary | ICD-10-CM | POA: Diagnosis not present

## 2017-12-08 DIAGNOSIS — Z792 Long term (current) use of antibiotics: Secondary | ICD-10-CM | POA: Diagnosis not present

## 2017-12-08 DIAGNOSIS — Z5181 Encounter for therapeutic drug level monitoring: Secondary | ICD-10-CM | POA: Diagnosis not present

## 2017-12-08 DIAGNOSIS — Z452 Encounter for adjustment and management of vascular access device: Secondary | ICD-10-CM | POA: Diagnosis not present

## 2017-12-11 ENCOUNTER — Ambulatory Visit: Payer: Medicare HMO | Admitting: Physician Assistant

## 2017-12-11 ENCOUNTER — Telehealth: Payer: Self-pay | Admitting: *Deleted

## 2017-12-11 DIAGNOSIS — Z452 Encounter for adjustment and management of vascular access device: Secondary | ICD-10-CM | POA: Diagnosis not present

## 2017-12-11 DIAGNOSIS — Z792 Long term (current) use of antibiotics: Secondary | ICD-10-CM | POA: Diagnosis not present

## 2017-12-11 DIAGNOSIS — B9562 Methicillin resistant Staphylococcus aureus infection as the cause of diseases classified elsewhere: Secondary | ICD-10-CM | POA: Diagnosis not present

## 2017-12-11 DIAGNOSIS — E1151 Type 2 diabetes mellitus with diabetic peripheral angiopathy without gangrene: Secondary | ICD-10-CM | POA: Diagnosis not present

## 2017-12-11 DIAGNOSIS — R7881 Bacteremia: Secondary | ICD-10-CM | POA: Diagnosis not present

## 2017-12-11 DIAGNOSIS — I70262 Atherosclerosis of native arteries of extremities with gangrene, left leg: Secondary | ICD-10-CM | POA: Diagnosis not present

## 2017-12-11 DIAGNOSIS — I70261 Atherosclerosis of native arteries of extremities with gangrene, right leg: Secondary | ICD-10-CM | POA: Diagnosis not present

## 2017-12-11 DIAGNOSIS — Z4781 Encounter for orthopedic aftercare following surgical amputation: Secondary | ICD-10-CM | POA: Diagnosis not present

## 2017-12-11 DIAGNOSIS — Z5181 Encounter for therapeutic drug level monitoring: Secondary | ICD-10-CM | POA: Diagnosis not present

## 2017-12-11 NOTE — Telephone Encounter (Signed)
Return call to patient phone after mesg. left for triage nurse. No answer.

## 2017-12-12 ENCOUNTER — Telehealth: Payer: Self-pay | Admitting: Hematology

## 2017-12-12 DIAGNOSIS — I110 Hypertensive heart disease with heart failure: Secondary | ICD-10-CM | POA: Diagnosis not present

## 2017-12-12 DIAGNOSIS — E785 Hyperlipidemia, unspecified: Secondary | ICD-10-CM | POA: Diagnosis not present

## 2017-12-12 DIAGNOSIS — Z794 Long term (current) use of insulin: Secondary | ICD-10-CM | POA: Diagnosis not present

## 2017-12-12 DIAGNOSIS — M79671 Pain in right foot: Secondary | ICD-10-CM | POA: Diagnosis not present

## 2017-12-12 DIAGNOSIS — N400 Enlarged prostate without lower urinary tract symptoms: Secondary | ICD-10-CM | POA: Diagnosis not present

## 2017-12-12 DIAGNOSIS — E559 Vitamin D deficiency, unspecified: Secondary | ICD-10-CM | POA: Diagnosis not present

## 2017-12-12 DIAGNOSIS — Z6830 Body mass index (BMI) 30.0-30.9, adult: Secondary | ICD-10-CM | POA: Diagnosis not present

## 2017-12-12 DIAGNOSIS — K5900 Constipation, unspecified: Secondary | ICD-10-CM | POA: Diagnosis not present

## 2017-12-12 DIAGNOSIS — E119 Type 2 diabetes mellitus without complications: Secondary | ICD-10-CM | POA: Diagnosis not present

## 2017-12-12 DIAGNOSIS — Z7902 Long term (current) use of antithrombotics/antiplatelets: Secondary | ICD-10-CM | POA: Diagnosis not present

## 2017-12-12 DIAGNOSIS — H548 Legal blindness, as defined in USA: Secondary | ICD-10-CM | POA: Diagnosis not present

## 2017-12-12 DIAGNOSIS — K219 Gastro-esophageal reflux disease without esophagitis: Secondary | ICD-10-CM | POA: Diagnosis not present

## 2017-12-12 DIAGNOSIS — D509 Iron deficiency anemia, unspecified: Secondary | ICD-10-CM | POA: Diagnosis not present

## 2017-12-12 DIAGNOSIS — E669 Obesity, unspecified: Secondary | ICD-10-CM | POA: Diagnosis not present

## 2017-12-12 DIAGNOSIS — J449 Chronic obstructive pulmonary disease, unspecified: Secondary | ICD-10-CM | POA: Diagnosis not present

## 2017-12-12 DIAGNOSIS — D61818 Other pancytopenia: Secondary | ICD-10-CM | POA: Diagnosis not present

## 2017-12-12 DIAGNOSIS — G4700 Insomnia, unspecified: Secondary | ICD-10-CM | POA: Diagnosis not present

## 2017-12-12 DIAGNOSIS — R42 Dizziness and giddiness: Secondary | ICD-10-CM | POA: Diagnosis not present

## 2017-12-12 NOTE — Telephone Encounter (Signed)
I received a msg from Dr. Jana Hakim for the pt to see any MD for pancytopenia. Pt was seen in the hospital. Pt returned my call and has been scheduled to see Dr. Irene Limbo on 10/10 at 1pm. Pt aware to arrive 30 minutes early. Letter mailed.

## 2017-12-13 ENCOUNTER — Ambulatory Visit (INDEPENDENT_AMBULATORY_CARE_PROVIDER_SITE_OTHER): Payer: Self-pay | Admitting: Physician Assistant

## 2017-12-13 ENCOUNTER — Inpatient Hospital Stay (HOSPITAL_COMMUNITY)
Admission: AD | Admit: 2017-12-13 | Discharge: 2017-12-19 | DRG: 857 | Disposition: A | Discharge status: Home Health | Payer: Medicare HMO | Source: Ambulatory Visit | Attending: Surgery | Admitting: Surgery

## 2017-12-13 ENCOUNTER — Encounter (HOSPITAL_COMMUNITY): Payer: Self-pay | Admitting: General Practice

## 2017-12-13 ENCOUNTER — Other Ambulatory Visit: Payer: Self-pay | Admitting: Physician Assistant

## 2017-12-13 ENCOUNTER — Other Ambulatory Visit: Payer: Self-pay

## 2017-12-13 DIAGNOSIS — Z955 Presence of coronary angioplasty implant and graft: Secondary | ICD-10-CM | POA: Diagnosis not present

## 2017-12-13 DIAGNOSIS — T8149XA Infection following a procedure, other surgical site, initial encounter: Principal | ICD-10-CM | POA: Diagnosis present

## 2017-12-13 DIAGNOSIS — Z794 Long term (current) use of insulin: Secondary | ICD-10-CM

## 2017-12-13 DIAGNOSIS — Z881 Allergy status to other antibiotic agents status: Secondary | ICD-10-CM

## 2017-12-13 DIAGNOSIS — Y838 Other surgical procedures as the cause of abnormal reaction of the patient, or of later complication, without mention of misadventure at the time of the procedure: Secondary | ICD-10-CM | POA: Diagnosis present

## 2017-12-13 DIAGNOSIS — Z87891 Personal history of nicotine dependence: Secondary | ICD-10-CM | POA: Diagnosis not present

## 2017-12-13 DIAGNOSIS — S91101A Unspecified open wound of right great toe without damage to nail, initial encounter: Secondary | ICD-10-CM | POA: Diagnosis not present

## 2017-12-13 DIAGNOSIS — I480 Paroxysmal atrial fibrillation: Secondary | ICD-10-CM | POA: Diagnosis not present

## 2017-12-13 DIAGNOSIS — D509 Iron deficiency anemia, unspecified: Secondary | ICD-10-CM | POA: Diagnosis present

## 2017-12-13 DIAGNOSIS — T8189XA Other complications of procedures, not elsewhere classified, initial encounter: Secondary | ICD-10-CM | POA: Insufficient documentation

## 2017-12-13 DIAGNOSIS — I739 Peripheral vascular disease, unspecified: Secondary | ICD-10-CM | POA: Insufficient documentation

## 2017-12-13 DIAGNOSIS — E039 Hypothyroidism, unspecified: Secondary | ICD-10-CM | POA: Diagnosis present

## 2017-12-13 DIAGNOSIS — T8189XD Other complications of procedures, not elsewhere classified, subsequent encounter: Secondary | ICD-10-CM | POA: Diagnosis not present

## 2017-12-13 DIAGNOSIS — K219 Gastro-esophageal reflux disease without esophagitis: Secondary | ICD-10-CM | POA: Diagnosis present

## 2017-12-13 DIAGNOSIS — E119 Type 2 diabetes mellitus without complications: Secondary | ICD-10-CM | POA: Diagnosis not present

## 2017-12-13 DIAGNOSIS — Z7902 Long term (current) use of antithrombotics/antiplatelets: Secondary | ICD-10-CM

## 2017-12-13 DIAGNOSIS — J449 Chronic obstructive pulmonary disease, unspecified: Secondary | ICD-10-CM | POA: Diagnosis present

## 2017-12-13 DIAGNOSIS — Z79899 Other long term (current) drug therapy: Secondary | ICD-10-CM | POA: Diagnosis not present

## 2017-12-13 DIAGNOSIS — Z885 Allergy status to narcotic agent status: Secondary | ICD-10-CM | POA: Diagnosis not present

## 2017-12-13 DIAGNOSIS — Z888 Allergy status to other drugs, medicaments and biological substances status: Secondary | ICD-10-CM | POA: Diagnosis not present

## 2017-12-13 DIAGNOSIS — I5032 Chronic diastolic (congestive) heart failure: Secondary | ICD-10-CM | POA: Diagnosis not present

## 2017-12-13 DIAGNOSIS — E782 Mixed hyperlipidemia: Secondary | ICD-10-CM | POA: Diagnosis present

## 2017-12-13 DIAGNOSIS — E1151 Type 2 diabetes mellitus with diabetic peripheral angiopathy without gangrene: Secondary | ICD-10-CM | POA: Diagnosis present

## 2017-12-13 DIAGNOSIS — G4700 Insomnia, unspecified: Secondary | ICD-10-CM | POA: Diagnosis present

## 2017-12-13 DIAGNOSIS — G4730 Sleep apnea, unspecified: Secondary | ICD-10-CM | POA: Diagnosis present

## 2017-12-13 DIAGNOSIS — I2510 Atherosclerotic heart disease of native coronary artery without angina pectoris: Secondary | ICD-10-CM | POA: Diagnosis present

## 2017-12-13 DIAGNOSIS — I110 Hypertensive heart disease with heart failure: Secondary | ICD-10-CM | POA: Diagnosis present

## 2017-12-13 DIAGNOSIS — Z89412 Acquired absence of left great toe: Secondary | ICD-10-CM

## 2017-12-13 DIAGNOSIS — Z8614 Personal history of Methicillin resistant Staphylococcus aureus infection: Secondary | ICD-10-CM | POA: Diagnosis not present

## 2017-12-13 DIAGNOSIS — L03115 Cellulitis of right lower limb: Secondary | ICD-10-CM | POA: Diagnosis present

## 2017-12-13 DIAGNOSIS — Z419 Encounter for procedure for purposes other than remedying health state, unspecified: Secondary | ICD-10-CM

## 2017-12-13 DIAGNOSIS — B9562 Methicillin resistant Staphylococcus aureus infection as the cause of diseases classified elsewhere: Secondary | ICD-10-CM | POA: Diagnosis not present

## 2017-12-13 DIAGNOSIS — Z89411 Acquired absence of right great toe: Secondary | ICD-10-CM

## 2017-12-13 LAB — COMPREHENSIVE METABOLIC PANEL
ALT: 24 U/L (ref 0–44)
AST: 31 U/L (ref 15–41)
Albumin: 2.6 g/dL — ABNORMAL LOW (ref 3.5–5.0)
Alkaline Phosphatase: 110 U/L (ref 38–126)
Anion gap: 15 (ref 5–15)
BUN: 20 mg/dL (ref 8–23)
CO2: 23 mmol/L (ref 22–32)
Calcium: 8.5 mg/dL — ABNORMAL LOW (ref 8.9–10.3)
Chloride: 96 mmol/L — ABNORMAL LOW (ref 98–111)
Creatinine, Ser: 1.26 mg/dL — ABNORMAL HIGH (ref 0.61–1.24)
GFR calc Af Amer: 60 mL/min — ABNORMAL LOW (ref >60)
GFR calc non Af Amer: 51 mL/min — ABNORMAL LOW (ref >60)
Glucose, Bld: 213 mg/dL — ABNORMAL HIGH (ref 70–99)
Potassium: 4.1 mmol/L (ref 3.5–5.1)
SODIUM: 134 mmol/L — AB (ref 135–145)
Total Bilirubin: 0.8 mg/dL (ref 0.3–1.2)
Total Protein: 7.5 g/dL (ref 6.5–8.1)

## 2017-12-13 LAB — URINALYSIS, ROUTINE W REFLEX MICROSCOPIC
BILIRUBIN URINE: NEGATIVE
GLUCOSE, UA: NEGATIVE mg/dL
Hgb urine dipstick: NEGATIVE
KETONES UR: NEGATIVE mg/dL
LEUKOCYTES UA: NEGATIVE
NITRITE: NEGATIVE
PROTEIN: NEGATIVE mg/dL
Specific Gravity, Urine: 1.014 (ref 1.005–1.030)
pH: 5 (ref 5.0–8.0)

## 2017-12-13 LAB — CBC
HCT: 33.7 % — ABNORMAL LOW (ref 39.0–52.0)
Hemoglobin: 10.5 g/dL — ABNORMAL LOW (ref 13.0–17.0)
MCH: 23.9 pg — ABNORMAL LOW (ref 26.0–34.0)
MCHC: 31.2 g/dL (ref 30.0–36.0)
MCV: 76.6 fL — ABNORMAL LOW (ref 78.0–100.0)
PLATELETS: 252 10*3/uL (ref 150–400)
RBC: 4.4 MIL/uL (ref 4.22–5.81)
RDW: 20 % — ABNORMAL HIGH (ref 11.5–15.5)
WBC: 4.4 10*3/uL (ref 4.0–10.5)

## 2017-12-13 LAB — GLUCOSE, CAPILLARY
GLUCOSE-CAPILLARY: 197 mg/dL — AB (ref 70–99)
Glucose-Capillary: 162 mg/dL — ABNORMAL HIGH (ref 70–99)

## 2017-12-13 LAB — PROTIME-INR
INR: 1.07
Prothrombin Time: 13.8 seconds (ref 11.4–15.2)

## 2017-12-13 MED ORDER — PIPERACILLIN-TAZOBACTAM 3.375 G IVPB
3.3750 g | Freq: Three times a day (TID) | INTRAVENOUS | Status: DC
  Administered 2017-12-13 – 2017-12-19 (×17): 3.375 g via INTRAVENOUS
  Filled 2017-12-13 (×19): qty 50

## 2017-12-13 MED ORDER — CLOPIDOGREL BISULFATE 75 MG PO TABS
75.0000 mg | ORAL_TABLET | Freq: Every day | ORAL | Status: DC
  Administered 2017-12-14 – 2017-12-16 (×3): 75 mg via ORAL
  Filled 2017-12-13 (×3): qty 1

## 2017-12-13 MED ORDER — VANCOMYCIN HCL 10 G IV SOLR
1500.0000 mg | INTRAVENOUS | Status: DC
  Administered 2017-12-14 – 2017-12-18 (×5): 1500 mg via INTRAVENOUS
  Filled 2017-12-13 (×6): qty 1500

## 2017-12-13 MED ORDER — ALUM & MAG HYDROXIDE-SIMETH 200-200-20 MG/5ML PO SUSP: 15.0000 mL | ORAL | Status: DC | PRN

## 2017-12-13 MED ORDER — VANCOMYCIN HCL 10 G IV SOLR
1750.0000 mg | Freq: Once | INTRAVENOUS | Status: AC
  Administered 2017-12-13: 1750 mg via INTRAVENOUS
  Filled 2017-12-13: qty 1750

## 2017-12-13 MED ORDER — AMLODIPINE BESYLATE 10 MG PO TABS
10.0000 mg | ORAL_TABLET | Freq: Every day | ORAL | Status: DC
  Administered 2017-12-14 – 2017-12-16 (×3): 10 mg via ORAL
  Filled 2017-12-13 (×3): qty 1

## 2017-12-13 MED ORDER — METOPROLOL SUCCINATE ER 100 MG PO TB24
100.0000 mg | ORAL_TABLET | Freq: Every day | ORAL | Status: DC
  Administered 2017-12-15: 100 mg via ORAL
  Filled 2017-12-13 (×3): qty 1

## 2017-12-13 MED ORDER — GUAIFENESIN-DM 100-10 MG/5ML PO SYRP: 15.0000 mL | ORAL_SOLUTION | ORAL | Status: DC | PRN

## 2017-12-13 MED ORDER — FUROSEMIDE 40 MG PO TABS
40.0000 mg | ORAL_TABLET | Freq: Every day | ORAL | Status: DC
  Administered 2017-12-14 – 2017-12-16 (×3): 40 mg via ORAL
  Filled 2017-12-13 (×3): qty 1

## 2017-12-13 MED ORDER — ALBUTEROL SULFATE (2.5 MG/3ML) 0.083% IN NEBU: 3.0000 mL | INHALATION_SOLUTION | RESPIRATORY_TRACT | Status: DC | PRN

## 2017-12-13 MED ORDER — ONDANSETRON HCL 4 MG/2ML IJ SOLN: 4.0000 mg | Freq: Four times a day (QID) | INTRAMUSCULAR | Status: DC | PRN

## 2017-12-13 MED ORDER — METOPROLOL TARTRATE 5 MG/5ML IV SOLN: 2.0000 mg | INTRAVENOUS | Status: DC | PRN

## 2017-12-13 MED ORDER — SODIUM CHLORIDE 0.9 % IV SOLN
INTRAVENOUS | Status: DC
  Administered 2017-12-13 – 2017-12-15 (×2): via INTRAVENOUS

## 2017-12-13 MED ORDER — POTASSIUM CHLORIDE CRYS ER 20 MEQ PO TBCR: 20.0000 meq | EXTENDED_RELEASE_TABLET | Freq: Once | ORAL | Status: DC

## 2017-12-13 MED ORDER — PHENOL 1.4 % MT LIQD: 1.0000 | OROMUCOSAL | Status: DC | PRN

## 2017-12-13 MED ORDER — LABETALOL HCL 5 MG/ML IV SOLN: 10.0000 mg | INTRAVENOUS | Status: DC | PRN

## 2017-12-13 MED ORDER — MORPHINE SULFATE (PF) 2 MG/ML IV SOLN: 2.0000 mg | INTRAVENOUS | Status: DC | PRN

## 2017-12-13 MED ORDER — TRAMADOL HCL 50 MG PO TABS
50.0000 mg | ORAL_TABLET | Freq: Four times a day (QID) | ORAL | Status: DC | PRN
  Administered 2017-12-15 – 2017-12-18 (×4): 50 mg via ORAL
  Filled 2017-12-13 (×4): qty 1

## 2017-12-13 MED ORDER — ACETAMINOPHEN 325 MG PO TABS
650.0000 mg | ORAL_TABLET | Freq: Four times a day (QID) | ORAL | Status: DC | PRN
  Administered 2017-12-13 – 2017-12-15 (×4): 650 mg via ORAL
  Filled 2017-12-13 (×4): qty 2

## 2017-12-13 MED ORDER — HYDRALAZINE HCL 20 MG/ML IJ SOLN: 5.0000 mg | INTRAMUSCULAR | Status: DC | PRN

## 2017-12-13 MED ORDER — TRAZODONE HCL 100 MG PO TABS
100.0000 mg | ORAL_TABLET | Freq: Every day | ORAL | Status: DC
  Administered 2017-12-13 – 2017-12-16 (×4): 100 mg via ORAL
  Filled 2017-12-13 (×4): qty 1

## 2017-12-13 MED ORDER — FINASTERIDE 5 MG PO TABS
5.0000 mg | ORAL_TABLET | Freq: Every day | ORAL | Status: DC
  Administered 2017-12-14 – 2017-12-16 (×3): 5 mg via ORAL
  Filled 2017-12-13 (×4): qty 1

## 2017-12-13 MED ORDER — INSULIN ASPART 100 UNIT/ML ~~LOC~~ SOLN
0.0000 [IU] | Freq: Three times a day (TID) | SUBCUTANEOUS | Status: DC
  Administered 2017-12-13 – 2017-12-14 (×3): 3 [IU] via SUBCUTANEOUS
  Administered 2017-12-15: 2 [IU] via SUBCUTANEOUS
  Administered 2017-12-15: 5 [IU] via SUBCUTANEOUS
  Administered 2017-12-15 – 2017-12-16 (×2): 3 [IU] via SUBCUTANEOUS
  Administered 2017-12-16: 11 [IU] via SUBCUTANEOUS
  Administered 2017-12-16 – 2017-12-17 (×2): 3 [IU] via SUBCUTANEOUS

## 2017-12-13 MED ORDER — COLLAGENASE 250 UNIT/GM EX OINT
1.0000 "application " | TOPICAL_OINTMENT | Freq: Every day | CUTANEOUS | Status: DC
  Administered 2017-12-14 – 2017-12-16 (×3): 1 via TOPICAL
  Filled 2017-12-13: qty 30

## 2017-12-13 MED ORDER — NITROGLYCERIN 0.4 MG SL SUBL: 0.4000 mg | SUBLINGUAL_TABLET | SUBLINGUAL | Status: DC | PRN

## 2017-12-13 MED ORDER — PANTOPRAZOLE SODIUM 40 MG PO TBEC
40.0000 mg | DELAYED_RELEASE_TABLET | Freq: Every day | ORAL | Status: DC
  Administered 2017-12-14 – 2017-12-16 (×3): 40 mg via ORAL
  Filled 2017-12-13 (×3): qty 1

## 2017-12-13 MED ORDER — FOLIC ACID 1 MG PO TABS
1.0000 mg | ORAL_TABLET | Freq: Every day | ORAL | Status: DC
  Administered 2017-12-14 – 2017-12-16 (×3): 1 mg via ORAL
  Filled 2017-12-13 (×3): qty 1

## 2017-12-13 MED ORDER — ATORVASTATIN CALCIUM 80 MG PO TABS
80.0000 mg | ORAL_TABLET | Freq: Every evening | ORAL | Status: DC
  Administered 2017-12-14 – 2017-12-16 (×3): 80 mg via ORAL
  Filled 2017-12-13 (×3): qty 1

## 2017-12-13 MED ORDER — FERROUS SULFATE 325 (65 FE) MG PO TABS
325.0000 mg | ORAL_TABLET | Freq: Every day | ORAL | Status: DC
  Filled 2017-12-13 (×3): qty 1

## 2017-12-13 NOTE — Progress Notes (Signed)
Patient arrived to 4E room 26 at this time. Patient was a direct admit from Dr. Stephens Shire office. V/s done and telemetry applied. CCMD notified. Assessment complete. Patient oriented to room and how to call the nurse with any needs.   Emelda Fear, RN

## 2017-12-13 NOTE — H&P (Signed)
Established Critical Limb Ischemia Patient   History of Present Illness   Mike Wright is a 84 y.o. (1933/12/09) male who presents with chief complaint: non healing R GT amputation site.  In June of this year Mike Wright underwent balloon angioplasty of left peroneal and posterior tibial arteries and 1 week later underwent balloon angioplasty of right posterior tibial artery with amputation of bilateral great toes.  He was ultimately discharged from the hospital with a PICC line and several weeks of IV antibiotics.  He presents to clinic today with a worsening discomfort and pain from his nonhealing right great toe amputation site.  He is concerned that he will require an amputation of his right leg.  He denies any fevers, chills, nausea/vomiting however does endorse the progression of right foot edema, drainage, and erythema.  He states that he does not have any pain or discomfort from left great toe amputation site and believes this is healing well.  He is taking his Plavix daily.  Past medical history significant for insulin-dependent diabetes mellitus.        Current Outpatient Medications  Medication Sig Dispense Refill  . acetaminophen (TYLENOL) 500 MG tablet Take 500 mg by mouth as needed for mild pain or headache.    . albuterol (PROVENTIL) (2.5 MG/3ML) 0.083% nebulizer solution Inhale 3 mLs into the lungs every 4 (four) hours as needed for wheezing or shortness of breath. 75 mL 12  . amLODipine (NORVASC) 10 MG tablet Take 1 tablet (10 mg total) by mouth daily. 90 tablet 3  . atorvastatin (LIPITOR) 80 MG tablet Take 1 tablet (80 mg total) by mouth every evening. 30 tablet 0  . Cholecalciferol (D3-1000 PO) Take 1,000 Units by mouth daily.     . clopidogrel (PLAVIX) 75 MG tablet Take 1 tablet (75 mg total) by mouth daily. 30 tablet 0  . collagenase (SANTYL) ointment Apply 1 application topically daily. 30 g 1  . Cyanocobalamin (B-12 PO) Take 1 tablet by mouth daily.     .  ferrous sulfate 325 (65 FE) MG tablet Take 1 tablet (325 mg total) by mouth daily. 30 tablet 3  . finasteride (PROSCAR) 5 MG tablet Take 5 mg by mouth daily.     . folic acid (FOLVITE) 1 MG tablet Take 1 tablet (1 mg total) by mouth daily. 30 tablet 1  . furosemide (LASIX) 40 MG tablet Take 1 tablet (40 mg total) by mouth daily. 30 tablet 2  . insulin glargine (LANTUS) 100 UNIT/ML injection Inject 30-40 Units into the skin See admin instructions. Inject 40 units SQ in the morning and inject 30 units SQ at bedtime    . meclizine (ANTIVERT) 12.5 MG tablet Take 1 tablet (12.5 mg total) by mouth 3 (three) times daily as needed for dizziness. 30 tablet 0  . metFORMIN (GLUCOPHAGE) 1000 MG tablet Take 1,000 mg by mouth 2 (two) times daily.    . metoprolol succinate (TOPROL-XL) 100 MG 24 hr tablet Take 1 tablet (100 mg total) by mouth daily. 30 tablet 1  . nitroGLYCERIN (NITROSTAT) 0.4 MG SL tablet PLACE 1 TABLET (0.4 MG TOTAL) UNDER THE TONGUE EVERY 5  MINUTES AS NEEDED FOR CHEST PAIN. (Patient taking differently: Place 0.4 mg under the tongue every 5 (five) minutes as needed. ) 25 tablet 3  . pantoprazole (PROTONIX) 40 MG tablet Take 1 tablet (40 mg total) by mouth daily. To protect stomach while taking multiple blood thinners. 30 tablet 2  . psyllium (METAMUCIL) 58.6 %  packet Take 1 packet by mouth daily.    . traZODone (DESYREL) 100 MG tablet Take 100 mg by mouth at bedtime.      No current facility-administered medications for this visit.      Physical Examination      Vitals:   12/13/17 1404  BP: 117/70  Pulse: (!) 130  Resp: 20  Temp: (!) 96.8 F (36 C)  TempSrc: Oral  SpO2: 91%  Weight: 196 lb (88.9 kg)  Height: 5\' 7"  (1.702 m)   Body mass index is 30.7 kg/m.  General Alert, O x 3, WD, NAD, emotional during exam due to anticipated prognosis  Pulmonary Sym exp, good B air movt,  Cardiac RRR, Nl S1, S2,   Vascular Vessel Right Left  Radial Palpable Palpable    Aorta Not palpable N/A  Femoral Palpable Palpable  Popliteal Not palpable Not palpable  PT Brisk signal by Doppler Brisk signal by Doppler  DP Soft ATA by Doppler; brisk peroneal signal by Doppler Soft peroneal by Doppler    Gastro- intestinal soft, non-distended, non-tender to palpation,   Musculo- skeletal  left great toe imitation site with callus formation no active drainage no cellulitis, small open area remaining; right foot edematous with advancing cellulitis of forefoot; purulent drainage from pinhole with manipulation of right foot; macerated skin edges of open area of right great toe amputation site; pinhole probed with deep tunnel  Neurologic Cranial nerves 2-12 intact , Pain and light touch intact in extremities , Motor exam as listed above     Medical Decision Making   Mike Wright is a 84 y.o. male who presents with: RLE critical limb ischemia with non healing R GT amputation site   Right lower extremity is well-perfused to the level of the ankle with a strong PT and peroneal signal by Doppler  Increasing pain as well as erythema, drainage, and cellulitis of right foot status post right great toe amputation  Plan will be for direct admission to the hospital for wound culture and IV antibiotics; the possibility of right below the knee amputation was discussed with the patient  Dr. Trula Slade will evaluate the patient later today   Dagoberto Ligas PA-C [facility] [place] Office: [phone]

## 2017-12-13 NOTE — Progress Notes (Signed)
[facility]  Patient Status: Active (receiving services up to time of hospitalization)  [facility] is providing the following services: RN  If patient discharges after hours, please call [phone].   Mike Wright 12/13/2017, 4:51 PM

## 2017-12-13 NOTE — Progress Notes (Signed)
Pharmacy Antibiotic Note  Mike Wright is a 84 y.o. male admitted on 12/13/2017 with non-healing right foot amputation site.  Patient reports that he completed IV antibiotics a month ago and stopped Bactrim a week ago.  He may need a right BKA.  Pharmacy has been consulted for vancomycin and Zosyn dosing for wound infection.    SCr 1.26, CrCL 48 ml/min, afebrile.   Plan: Vanc 1750mg  IV x 1, then 1500mg  IV Q24H Zosyn EID 3.375gm IV Q8H Monitor renal fxn, clinical progress, vanc trough as indicated     Temp (24hrs), Avg:97.3 F (36.3 C), Min:96.8 F (36 C), Max:97.7 F (36.5 C)  Recent Labs  Lab 12/13/17 1623  WBC 4.4  CREATININE 1.26*    Estimated Creatinine Clearance: 48.1 mL/min (A) (by C-G formula based on SCr of 1.26 mg/dL (H)).    Allergies  Allergen Reactions  . Bactrim [Sulfamethoxazole-Trimethoprim]     Pancytopenia  . Codeine Shortness Of Breath  . Heparin Other (See Comments)    +HIT,  Severe bleeding (with heparin drip & large doses)  . Losartan Swelling    Per ENT UNSPECIFIED REACTION   . Other Other (See Comments)    Severe bleeding UNSPECIFIED AGENT   . Oxycodone Other (See Comments)    "Made me act out of my mind" Other reaction(s): Other (See Comments) Mental status changes hallucinations    Vanc 9/18 >> Zosyn 9/18 >>  9/18 wound cx -    Shari Natt D. Mina Marble, PharmD, BCPS, [place] 12/13/2017, 6:24 PM

## 2017-12-13 NOTE — Progress Notes (Signed)
Established Critical Limb Ischemia Patient   History of Present Illness   Mike Wright is a 84 y.o. (Nov 04, 1933) male who presents with chief complaint: non healing R GT amputation site.  In June of this year Mike Wright underwent balloon angioplasty of left peroneal and posterior tibial arteries and 1 week later underwent balloon angioplasty of right posterior tibial artery with amputation of bilateral great toes.  He was ultimately discharged from the hospital with a PICC line and several weeks of IV antibiotics.  He presents to clinic today with a worsening discomfort and pain from his nonhealing right great toe amputation site.  He is concerned that he will require an amputation of his right leg.  He denies any fevers, chills, nausea/vomiting however does endorse the progression of right foot edema, drainage, and erythema.  He states that he does not have any pain or discomfort from left great toe amputation site and believes this is healing well.  He is taking his Plavix daily.  Past medical history significant for insulin-dependent diabetes mellitus.  Current Outpatient Medications  Medication Sig Dispense Refill  . acetaminophen (TYLENOL) 500 MG tablet Take 500 mg by mouth as needed for mild pain or headache.    . albuterol (PROVENTIL) (2.5 MG/3ML) 0.083% nebulizer solution Inhale 3 mLs into the lungs every 4 (four) hours as needed for wheezing or shortness of breath. 75 mL 12  . amLODipine (NORVASC) 10 MG tablet Take 1 tablet (10 mg total) by mouth daily. 90 tablet 3  . atorvastatin (LIPITOR) 80 MG tablet Take 1 tablet (80 mg total) by mouth every evening. 30 tablet 0  . Cholecalciferol (D3-1000 PO) Take 1,000 Units by mouth daily.     . clopidogrel (PLAVIX) 75 MG tablet Take 1 tablet (75 mg total) by mouth daily. 30 tablet 0  . collagenase (SANTYL) ointment Apply 1 application topically daily. 30 g 1  . Cyanocobalamin (B-12 PO) Take 1 tablet by mouth daily.     . ferrous sulfate 325  (65 FE) MG tablet Take 1 tablet (325 mg total) by mouth daily. 30 tablet 3  . finasteride (PROSCAR) 5 MG tablet Take 5 mg by mouth daily.     . folic acid (FOLVITE) 1 MG tablet Take 1 tablet (1 mg total) by mouth daily. 30 tablet 1  . furosemide (LASIX) 40 MG tablet Take 1 tablet (40 mg total) by mouth daily. 30 tablet 2  . insulin glargine (LANTUS) 100 UNIT/ML injection Inject 30-40 Units into the skin See admin instructions. Inject 40 units SQ in the morning and inject 30 units SQ at bedtime    . meclizine (ANTIVERT) 12.5 MG tablet Take 1 tablet (12.5 mg total) by mouth 3 (three) times daily as needed for dizziness. 30 tablet 0  . metFORMIN (GLUCOPHAGE) 1000 MG tablet Take 1,000 mg by mouth 2 (two) times daily.    . metoprolol succinate (TOPROL-XL) 100 MG 24 hr tablet Take 1 tablet (100 mg total) by mouth daily. 30 tablet 1  . nitroGLYCERIN (NITROSTAT) 0.4 MG SL tablet PLACE 1 TABLET (0.4 MG TOTAL) UNDER THE TONGUE EVERY 5  MINUTES AS NEEDED FOR CHEST PAIN. (Patient taking differently: Place 0.4 mg under the tongue every 5 (five) minutes as needed. ) 25 tablet 3  . pantoprazole (PROTONIX) 40 MG tablet Take 1 tablet (40 mg total) by mouth daily. To protect stomach while taking multiple blood thinners. 30 tablet 2  . psyllium (METAMUCIL) 58.6 % packet Take 1 packet by  mouth daily.    . traZODone (DESYREL) 100 MG tablet Take 100 mg by mouth at bedtime.      No current facility-administered medications for this visit.      Physical Examination   Vitals:   12/13/17 1404  BP: 117/70  Pulse: (!) 130  Resp: 20  Temp: (!) 96.8 F (36 C)  TempSrc: Oral  SpO2: 91%  Weight: 196 lb (88.9 kg)  Height: 5\' 7"  (1.702 m)   Body mass index is 30.7 kg/m.  General Alert, O x 3, WD, NAD, emotional during exam due to anticipated prognosis  Pulmonary Sym exp, good B air movt,  Cardiac RRR, Nl S1, S2,   Vascular Vessel Right Left  Radial Palpable Palpable  Aorta Not palpable N/A  Femoral Palpable  Palpable  Popliteal Not palpable Not palpable  PT Brisk signal by Doppler Brisk signal by Doppler  DP Soft ATA by Doppler; brisk peroneal signal by Doppler Soft peroneal by Doppler    Gastro- intestinal soft, non-distended, non-tender to palpation,   Musculo- skeletal  left great toe imitation site with callus formation no active drainage no cellulitis, small open area remaining; right foot edematous with advancing cellulitis of forefoot; purulent drainage from pinhole with manipulation of right foot; macerated skin edges of open area of right great toe amputation site; pinhole probed with deep tunnel  Neurologic Cranial nerves 2-12 intact , Pain and light touch intact in extremities , Motor exam as listed above     Medical Decision Making   Mike Wright is a 84 y.o. male who presents with: RLE critical limb ischemia with non healing R GT amputation site   Right lower extremity is well-perfused to the level of the ankle with a strong PT and peroneal signal by Doppler  Increasing pain as well as erythema, drainage, and cellulitis of right foot status post right great toe amputation  Plan will be for direct admission to the hospital for wound culture and IV antibiotics; the possibility of right below the knee amputation was discussed with the patient  Dr. Trula Slade will evaluate the patient later today   Dagoberto Ligas PA-C [facility] [place] Office: [phone]

## 2017-12-14 LAB — GLUCOSE, CAPILLARY
GLUCOSE-CAPILLARY: 179 mg/dL — AB (ref 70–99)
Glucose-Capillary: 151 mg/dL — ABNORMAL HIGH (ref 70–99)
Glucose-Capillary: 110 mg/dL — ABNORMAL HIGH (ref 70–99)
Glucose-Capillary: 153 mg/dL — ABNORMAL HIGH (ref 70–99)
Glucose-Capillary: 154 mg/dL — ABNORMAL HIGH (ref 70–99)

## 2017-12-14 NOTE — Plan of Care (Signed)
  Problem: Activity: Goal: Risk for activity intolerance will decrease Outcome: Progressing   Problem: Safety: Goal: Ability to remain free from injury will improve Outcome: Progressing   Problem: Skin Integrity: Goal: Risk for impaired skin integrity will decrease Outcome: Progressing   

## 2017-12-14 NOTE — Progress Notes (Addendum)
  Progress Note    12/14/2017 7:35 AM * No surgery found *  Subjective:  Says he feels better and his foot looks better  Tm 99.4 afebrile HR 70's-100's afib 82'N-562'Z systolic 30% RA  Vitals:   12/14/17 0425 12/14/17 0659  BP: 95/77 114/67  Pulse: 82 72  Resp: (!) 25 18  Temp: (!) 97.5 F (36.4 C) 98.2 F (36.8 C)  SpO2: 94%     Physical Exam:  Incisions:      CBC    Component Value Date/Time   WBC 4.4 12/13/2017 1623   RBC 4.40 12/13/2017 1623   HGB 10.5 (L) 12/13/2017 1623   HGB 12.3 (L) 05/30/2017 1531   HCT 33.7 (L) 12/13/2017 1623   HCT 37.7 05/30/2017 1531   PLT 252 12/13/2017 1623   PLT 212 05/30/2017 1531   MCV 76.6 (L) 12/13/2017 1623   MCV 86 05/30/2017 1531   MCH 23.9 (L) 12/13/2017 1623   MCHC 31.2 12/13/2017 1623   RDW 20.0 (H) 12/13/2017 1623   RDW 16.0 (H) 05/30/2017 1531   LYMPHSABS 0.7 12/02/2017 0950   MONOABS 0.4 12/02/2017 0950   EOSABS 0.0 12/02/2017 0950   BASOSABS 0.0 12/02/2017 0950    BMET    Component Value Date/Time   NA 134 (L) 12/13/2017 1623   NA 139 05/30/2017 1531   K 4.1 12/13/2017 1623   CL 96 (L) 12/13/2017 1623   CO2 23 12/13/2017 1623   GLUCOSE 213 (H) 12/13/2017 1623   BUN 20 12/13/2017 1623   BUN 13 05/30/2017 1531   CREATININE 1.26 (H) 12/13/2017 1623   CALCIUM 8.5 (L) 12/13/2017 1623   GFRNONAA 51 (L) 12/13/2017 1623   GFRAA 60 (L) 12/13/2017 1623    INR    Component Value Date/Time   INR 1.07 12/13/2017 1623     Intake/Output Summary (Last 24 hours) at 12/14/2017 0735 Last data filed at 12/14/2017 0500 Gross per 24 hour  Intake 1240 ml  Output 500 ml  Net 740 ml    Specimen Description WOUND RIGHT FOOT   Special Requests NONE   Gram Stain NO WBC SEEN  FEW GRAM POSITIVE RODS  RARE GRAM POSITIVE COCCI  Performed at [facility], [place] [address], [place], [place] [address]   Culture PENDING   Report Status PENDING     Assessment:  84 y.o. male admitted with cellulitis and wound  infection of right great toe amp site   Plan: -cellulitis improved with abx as well as pain-gram stain reveals few GPR and rare GPC-will await culture and sensitivities.  Continue IV abx -Afib-has hx of PAF, but not on AC at home-he is on Plavix -DM-sliding scale -DVT prophylaxis:  Allergy to heparin   Leontine Locket, PA-C [facility] [phone] 12/14/2017 7:35 AM  Cellulitis much improved per patient and wife.  Wounds on both feet have contracted since I last saw him. Continue IV ABX for another 1-2 days and convert to PO.  [name]

## 2017-12-15 LAB — GLUCOSE, CAPILLARY
GLUCOSE-CAPILLARY: 200 mg/dL — AB (ref 70–99)
Glucose-Capillary: 149 mg/dL — ABNORMAL HIGH (ref 70–99)
Glucose-Capillary: 218 mg/dL — ABNORMAL HIGH (ref 70–99)
Glucose-Capillary: 157 mg/dL — ABNORMAL HIGH (ref 70–99)

## 2017-12-15 NOTE — Progress Notes (Signed)
  Progress Note    12/15/2017 12:39 PM  Subjective:  R foot feeling better   Vitals:   12/15/17 1100 12/15/17 1115  BP: (!) 149/73 (!) 149/73  Pulse: 92 82  Resp:  (!) 23  Temp: (!) 97.4 F (36.3 C)   SpO2: 97% 99%   Physical Exam: Lungs:  Non labored Incisions:  R GT amp site with pinhole purulent drainage, cellulitis and foot edema improved; L GT amp site healing well without sign of infection  Extremities: feet warm to touch with good cap refill Abdomen:  soft Neurologic: A&O  CBC    Component Value Date/Time   WBC 4.4 12/13/2017 1623   RBC 4.40 12/13/2017 1623   HGB 10.5 (L) 12/13/2017 1623   HGB 12.3 (L) 05/30/2017 1531   HCT 33.7 (L) 12/13/2017 1623   HCT 37.7 05/30/2017 1531   PLT 252 12/13/2017 1623   PLT 212 05/30/2017 1531   MCV 76.6 (L) 12/13/2017 1623   MCV 86 05/30/2017 1531   MCH 23.9 (L) 12/13/2017 1623   MCHC 31.2 12/13/2017 1623   RDW 20.0 (H) 12/13/2017 1623   RDW 16.0 (H) 05/30/2017 1531   LYMPHSABS 0.7 12/02/2017 0950   MONOABS 0.4 12/02/2017 0950   EOSABS 0.0 12/02/2017 0950   BASOSABS 0.0 12/02/2017 0950    BMET    Component Value Date/Time   NA 134 (L) 12/13/2017 1623   NA 139 05/30/2017 1531   K 4.1 12/13/2017 1623   CL 96 (L) 12/13/2017 1623   CO2 23 12/13/2017 1623   GLUCOSE 213 (H) 12/13/2017 1623   BUN 20 12/13/2017 1623   BUN 13 05/30/2017 1531   CREATININE 1.26 (H) 12/13/2017 1623   CALCIUM 8.5 (L) 12/13/2017 1623   GFRNONAA 51 (L) 12/13/2017 1623   GFRAA 60 (L) 12/13/2017 1623    INR    Component Value Date/Time   INR 1.07 12/13/2017 1623     Intake/Output Summary (Last 24 hours) at 12/15/2017 1239 Last data filed at 12/15/2017 0449 Gross per 24 hour  Intake 481.81 ml  Output 920 ml  Net -438.19 ml     Assessment/Plan:  84 y.o. male with nonhealing R GT amp site  Continue IV antibiotics Packing pinhole wound medial L foot; dry dressing with gauze and wrapped kerlex Edema/erythema improved R foot Patient  may require surgical debridement vs amputation if current wound care fails  Dagoberto Ligas, PA-C [facility] [phone] 12/15/2017 12:39 PM

## 2017-12-16 LAB — CBC
HCT: 28.5 % — ABNORMAL LOW (ref 39.0–52.0)
HEMOGLOBIN: 8.9 g/dL — AB (ref 13.0–17.0)
MCH: 24.1 pg — AB (ref 26.0–34.0)
MCHC: 31.2 g/dL (ref 30.0–36.0)
MCV: 77 fL — ABNORMAL LOW (ref 78.0–100.0)
Platelets: 223 10*3/uL (ref 150–400)
RBC: 3.7 MIL/uL — ABNORMAL LOW (ref 4.22–5.81)
RDW: 19.9 % — ABNORMAL HIGH (ref 11.5–15.5)
WBC: 3.5 10*3/uL — ABNORMAL LOW (ref 4.0–10.5)

## 2017-12-16 LAB — BASIC METABOLIC PANEL
Anion gap: 9 (ref 5–15)
BUN: 13 mg/dL (ref 8–23)
CALCIUM: 8.1 mg/dL — AB (ref 8.9–10.3)
CHLORIDE: 100 mmol/L (ref 98–111)
CO2: 26 mmol/L (ref 22–32)
CREATININE: 1.04 mg/dL (ref 0.61–1.24)
GFR calc Af Amer: >60 mL/min (ref >60)
GFR calc non Af Amer: >60 mL/min (ref >60)
GLUCOSE: 203 mg/dL — AB (ref 70–99)
POTASSIUM: 4 mmol/L (ref 3.5–5.1)
SODIUM: 135 mmol/L (ref 135–145)

## 2017-12-16 LAB — GLUCOSE, CAPILLARY
GLUCOSE-CAPILLARY: 173 mg/dL — AB (ref 70–99)
Glucose-Capillary: 187 mg/dL — ABNORMAL HIGH (ref 70–99)
Glucose-Capillary: 320 mg/dL — ABNORMAL HIGH (ref 70–99)
Glucose-Capillary: 177 mg/dL — ABNORMAL HIGH (ref 70–99)

## 2017-12-16 LAB — AEROBIC CULTURE  (SUPERFICIAL SPECIMEN)

## 2017-12-16 LAB — AEROBIC CULTURE W GRAM STAIN (SUPERFICIAL SPECIMEN): Gram Stain: NONE SEEN

## 2017-12-16 NOTE — Progress Notes (Addendum)
  Progress Note    12/16/2017 8:39 AM * No surgery found *  Subjective:  No new complaints this am   Vitals:   12/16/17 0324 12/16/17 0800  BP: 118/63 136/67  Pulse: 96 (!) 54  Resp: (!) 27 (!) 24  Temp: 98.1 F (36.7 C) 98.1 F (36.7 C)  SpO2: 98% 96%   Physical Exam: Lungs:  Non labored Incisions:  R foot still with purulent drainage from pinhole wound; probe to bone; cellulitis and edema improved Extremities:  Feet warm to touch Abdomen:  Soft Neurologic: A&O  CBC    Component Value Date/Time   WBC 3.5 (L) 12/16/2017 0246   RBC 3.70 (L) 12/16/2017 0246   HGB 8.9 (L) 12/16/2017 0246   HGB 12.3 (L) 05/30/2017 1531   HCT 28.5 (L) 12/16/2017 0246   HCT 37.7 05/30/2017 1531   PLT 223 12/16/2017 0246   PLT 212 05/30/2017 1531   MCV 77.0 (L) 12/16/2017 0246   MCV 86 05/30/2017 1531   MCH 24.1 (L) 12/16/2017 0246   MCHC 31.2 12/16/2017 0246   RDW 19.9 (H) 12/16/2017 0246   RDW 16.0 (H) 05/30/2017 1531   LYMPHSABS 0.7 12/02/2017 0950   MONOABS 0.4 12/02/2017 0950   EOSABS 0.0 12/02/2017 0950   BASOSABS 0.0 12/02/2017 0950    BMET    Component Value Date/Time   NA 135 12/16/2017 0246   NA 139 05/30/2017 1531   K 4.0 12/16/2017 0246   CL 100 12/16/2017 0246   CO2 26 12/16/2017 0246   GLUCOSE 203 (H) 12/16/2017 0246   BUN 13 12/16/2017 0246   BUN 13 05/30/2017 1531   CREATININE 1.04 12/16/2017 0246   CALCIUM 8.1 (L) 12/16/2017 0246   GFRNONAA >60 12/16/2017 0246   GFRAA >60 12/16/2017 0246    INR    Component Value Date/Time   INR 1.07 12/13/2017 1623     Intake/Output Summary (Last 24 hours) at 12/16/2017 0839 Last data filed at 12/16/2017 0643 Gross per 24 hour  Intake 600 ml  Output -  Net 600 ml     Assessment/Plan:  84 y.o. male with nonhealing R GT amp site  Persistent purulent drainage with tunnel to bone R foot Continue IV antibiotics Plan will be for debridement R foot in OR tomorrow 9/22 NPO past midnight Patient and wife agree  to proceed with surgical debridement    Dagoberto Ligas, PA-C [facility] [phone] 12/16/2017 8:39 AM  Will plan for I$D of right great toe tomorrow.  [name]

## 2017-12-16 NOTE — Progress Notes (Signed)
Pharmacy Antibiotic Note  Mike Wright is a 84 y.o. male admitted on 12/13/2017 with non-healing right foot amputation site. Patient reports that he completed IV antibiotics a month PTA and stopped Bactrim 1 week PTA. Pharmacy has been consulted for vancomycin and Zosyn dosing for MRSA w/ mixed cx wound infection - day #4. SCr improved to 1.04. Afebrile, WBC down to 3.5. Scheduled for    Plan: -Vancomycin 1500mg  IV Q24H -Zosyn 3.375g IV Q8H (4h infusion) -Monitor clinical progress, c/s, renal function -Will hold off on vancomycin trough as plan has been to change to PO in next 1-2 days. F/u de-escalation plan/LOT -OR 9/22 for debridement of R foot    Height: 5\' 7"  (170.2 cm) Weight: 195 lb 15.8 oz (88.9 kg) IBW/kg (Calculated) : 66.1  Temp (24hrs), Avg:98.1 F (36.7 C), Min:97.8 F (36.6 C), Max:98.3 F (36.8 C)  Recent Labs  Lab 12/13/17 1623 12/16/17 0246  WBC 4.4 3.5*  CREATININE 1.26* 1.04    Estimated Creatinine Clearance: 58.2 mL/min (by C-G formula based on SCr of 1.04 mg/dL).    Allergies  Allergen Reactions  . Bactrim [Sulfamethoxazole-Trimethoprim]     Pancytopenia  . Codeine Shortness Of Breath  . Heparin Other (See Comments)    +HIT,  Severe bleeding (with heparin drip & large doses)  . Losartan Swelling    Per ENT UNSPECIFIED REACTION   . Other Other (See Comments)    Severe bleeding UNSPECIFIED AGENT   . Oxycodone Other (See Comments)    "Made me act out of my mind" Other reaction(s): Other (See Comments) Mental status changes hallucinations    Vanc 9/18 >> Zosyn 9/18 >>  9/18 wound cx - mrsa, mixed cx  Elicia Lamp, PharmD, BCPS Clinical Pharmacist Clinical phone [phone] Please check AMION for all [place] contact numbers 12/16/2017 1:42 PM

## 2017-12-16 NOTE — H&P (View-Only) (Signed)
  Progress Note    12/16/2017 8:39 AM * No surgery found *  Subjective:  No new complaints this am   Vitals:   12/16/17 0324 12/16/17 0800  BP: 118/63 136/67  Pulse: 96 (!) 54  Resp: (!) 27 (!) 24  Temp: 98.1 F (36.7 C) 98.1 F (36.7 C)  SpO2: 98% 96%   Physical Exam: Lungs:  Non labored Incisions:  R foot still with purulent drainage from pinhole wound; probe to bone; cellulitis and edema improved Extremities:  Feet warm to touch Abdomen:  Soft Neurologic: A&O  CBC    Component Value Date/Time   WBC 3.5 (L) 12/16/2017 0246   RBC 3.70 (L) 12/16/2017 0246   HGB 8.9 (L) 12/16/2017 0246   HGB 12.3 (L) 05/30/2017 1531   HCT 28.5 (L) 12/16/2017 0246   HCT 37.7 05/30/2017 1531   PLT 223 12/16/2017 0246   PLT 212 05/30/2017 1531   MCV 77.0 (L) 12/16/2017 0246   MCV 86 05/30/2017 1531   MCH 24.1 (L) 12/16/2017 0246   MCHC 31.2 12/16/2017 0246   RDW 19.9 (H) 12/16/2017 0246   RDW 16.0 (H) 05/30/2017 1531   LYMPHSABS 0.7 12/02/2017 0950   MONOABS 0.4 12/02/2017 0950   EOSABS 0.0 12/02/2017 0950   BASOSABS 0.0 12/02/2017 0950    BMET    Component Value Date/Time   NA 135 12/16/2017 0246   NA 139 05/30/2017 1531   K 4.0 12/16/2017 0246   CL 100 12/16/2017 0246   CO2 26 12/16/2017 0246   GLUCOSE 203 (H) 12/16/2017 0246   BUN 13 12/16/2017 0246   BUN 13 05/30/2017 1531   CREATININE 1.04 12/16/2017 0246   CALCIUM 8.1 (L) 12/16/2017 0246   GFRNONAA >60 12/16/2017 0246   GFRAA >60 12/16/2017 0246    INR    Component Value Date/Time   INR 1.07 12/13/2017 1623     Intake/Output Summary (Last 24 hours) at 12/16/2017 0839 Last data filed at 12/16/2017 0643 Gross per 24 hour  Intake 600 ml  Output -  Net 600 ml     Assessment/Plan:  84 y.o. male with nonhealing R GT amp site  Persistent purulent drainage with tunnel to bone R foot Continue IV antibiotics Plan will be for debridement R foot in OR tomorrow 9/22 NPO past midnight Patient and wife agree  to proceed with surgical debridement    Dagoberto Ligas, PA-C [facility] [phone] 12/16/2017 8:39 AM  Will plan for I$D of right great toe tomorrow.  [name]

## 2017-12-17 ENCOUNTER — Inpatient Hospital Stay (HOSPITAL_COMMUNITY): Payer: Medicare HMO | Admitting: Anesthesiology

## 2017-12-17 ENCOUNTER — Encounter (HOSPITAL_COMMUNITY)
Admission: AD | Disposition: A | Discharge status: Home Health | Payer: Self-pay | Source: Ambulatory Visit | Attending: Surgery

## 2017-12-17 DIAGNOSIS — T8189XD Other complications of procedures, not elsewhere classified, subsequent encounter: Secondary | ICD-10-CM

## 2017-12-17 HISTORY — PX: I & D EXTREMITY: SHX5045

## 2017-12-17 LAB — GLUCOSE, CAPILLARY
GLUCOSE-CAPILLARY: 164 mg/dL — AB (ref 70–99)
GLUCOSE-CAPILLARY: 248 mg/dL — AB (ref 70–99)
GLUCOSE-CAPILLARY: 201 mg/dL — AB (ref 70–99)
Glucose-Capillary: 136 mg/dL — ABNORMAL HIGH (ref 70–99)
Glucose-Capillary: 203 mg/dL — ABNORMAL HIGH (ref 70–99)

## 2017-12-17 LAB — MRSA PCR SCREENING: MRSA BY PCR: NEGATIVE

## 2017-12-17 SURGERY — IRRIGATION AND DEBRIDEMENT EXTREMITY
Anesthesia: General | Laterality: Right

## 2017-12-17 MED ORDER — LACTATED RINGERS IV SOLN
INTRAVENOUS | Status: DC | PRN
  Administered 2017-12-17: 07:00:00 via INTRAVENOUS

## 2017-12-17 MED ORDER — LIDOCAINE HCL (CARDIAC) PF 100 MG/5ML IV SOSY
PREFILLED_SYRINGE | INTRAVENOUS | Status: DC | PRN
  Administered 2017-12-17: 80 mg via INTRAVENOUS

## 2017-12-17 MED ORDER — FENTANYL CITRATE (PF) 100 MCG/2ML IJ SOLN: 25.0000 ug | INTRAMUSCULAR | Status: DC | PRN

## 2017-12-17 MED ORDER — PANTOPRAZOLE SODIUM 40 MG PO TBEC
40.0000 mg | DELAYED_RELEASE_TABLET | Freq: Every day | ORAL | Status: DC
  Administered 2017-12-17 – 2017-12-19 (×3): 40 mg via ORAL
  Filled 2017-12-17 (×3): qty 1

## 2017-12-17 MED ORDER — MECLIZINE HCL 25 MG PO TABS: 12.5000 mg | ORAL_TABLET | Freq: Three times a day (TID) | ORAL | Status: DC | PRN

## 2017-12-17 MED ORDER — ONDANSETRON HCL 4 MG/2ML IJ SOLN
INTRAMUSCULAR | Status: AC
  Filled 2017-12-17: qty 2

## 2017-12-17 MED ORDER — SUCCINYLCHOLINE CHLORIDE 200 MG/10ML IV SOSY
PREFILLED_SYRINGE | INTRAVENOUS | Status: AC
  Filled 2017-12-17: qty 10

## 2017-12-17 MED ORDER — FOLIC ACID 1 MG PO TABS
1.0000 mg | ORAL_TABLET | Freq: Every day | ORAL | Status: DC
  Administered 2017-12-17 – 2017-12-19 (×3): 1 mg via ORAL
  Filled 2017-12-17 (×3): qty 1

## 2017-12-17 MED ORDER — INSULIN ASPART 100 UNIT/ML ~~LOC~~ SOLN
0.0000 [IU] | Freq: Three times a day (TID) | SUBCUTANEOUS | Status: DC
  Administered 2017-12-17: 5 [IU] via SUBCUTANEOUS
  Administered 2017-12-18 (×2): 3 [IU] via SUBCUTANEOUS
  Administered 2017-12-18 – 2017-12-19 (×3): 5 [IU] via SUBCUTANEOUS

## 2017-12-17 MED ORDER — TRAZODONE HCL 100 MG PO TABS
100.0000 mg | ORAL_TABLET | Freq: Every day | ORAL | Status: DC
  Administered 2017-12-17 – 2017-12-18 (×2): 100 mg via ORAL
  Filled 2017-12-17 (×2): qty 1

## 2017-12-17 MED ORDER — CLOPIDOGREL BISULFATE 75 MG PO TABS
75.0000 mg | ORAL_TABLET | Freq: Every day | ORAL | Status: DC
  Administered 2017-12-17 – 2017-12-19 (×3): 75 mg via ORAL
  Filled 2017-12-17 (×3): qty 1

## 2017-12-17 MED ORDER — ONDANSETRON HCL 4 MG/2ML IJ SOLN: 4.0000 mg | Freq: Once | INTRAMUSCULAR | Status: DC | PRN

## 2017-12-17 MED ORDER — FUROSEMIDE 40 MG PO TABS
40.0000 mg | ORAL_TABLET | Freq: Every day | ORAL | Status: DC
  Administered 2017-12-17 – 2017-12-19 (×3): 40 mg via ORAL
  Filled 2017-12-17 (×3): qty 1

## 2017-12-17 MED ORDER — ROCURONIUM BROMIDE 50 MG/5ML IV SOSY
PREFILLED_SYRINGE | INTRAVENOUS | Status: AC
  Filled 2017-12-17: qty 5

## 2017-12-17 MED ORDER — VITAMIN B-12 100 MCG PO TABS
ORAL_TABLET | Freq: Every day | ORAL | Status: DC
  Administered 2017-12-18 – 2017-12-19 (×2): 100 ug via ORAL
  Filled 2017-12-17 (×3): qty 1

## 2017-12-17 MED ORDER — FINASTERIDE 5 MG PO TABS
5.0000 mg | ORAL_TABLET | Freq: Every day | ORAL | Status: DC
  Administered 2017-12-17 – 2017-12-19 (×3): 5 mg via ORAL
  Filled 2017-12-17 (×3): qty 1

## 2017-12-17 MED ORDER — VITAMIN D 1000 UNITS PO TABS
1000.0000 [IU] | ORAL_TABLET | Freq: Every day | ORAL | Status: DC
  Administered 2017-12-17 – 2017-12-19 (×3): 1000 [IU] via ORAL
  Filled 2017-12-17 (×3): qty 1

## 2017-12-17 MED ORDER — FENTANYL CITRATE (PF) 250 MCG/5ML IJ SOLN
INTRAMUSCULAR | Status: AC
  Filled 2017-12-17: qty 5

## 2017-12-17 MED ORDER — PROPOFOL 10 MG/ML IV BOLUS
INTRAVENOUS | Status: DC | PRN
  Administered 2017-12-17: 60 mg via INTRAVENOUS

## 2017-12-17 MED ORDER — ONDANSETRON HCL 4 MG/2ML IJ SOLN
INTRAMUSCULAR | Status: DC | PRN
  Administered 2017-12-17: 4 mg via INTRAVENOUS

## 2017-12-17 MED ORDER — NITROGLYCERIN 0.4 MG SL SUBL: 0.4000 mg | SUBLINGUAL_TABLET | SUBLINGUAL | Status: DC | PRN

## 2017-12-17 MED ORDER — AMLODIPINE BESYLATE 10 MG PO TABS
10.0000 mg | ORAL_TABLET | Freq: Every day | ORAL | Status: DC
  Administered 2017-12-17 – 2017-12-19 (×3): 10 mg via ORAL
  Filled 2017-12-17 (×3): qty 1

## 2017-12-17 MED ORDER — COLLAGENASE 250 UNIT/GM EX OINT
1.0000 "application " | TOPICAL_OINTMENT | Freq: Every day | CUTANEOUS | Status: DC
  Administered 2017-12-19: 1 via TOPICAL
  Filled 2017-12-17: qty 30

## 2017-12-17 MED ORDER — PROPOFOL 10 MG/ML IV BOLUS
INTRAVENOUS | Status: AC
  Filled 2017-12-17: qty 20

## 2017-12-17 MED ORDER — PSYLLIUM 95 % PO PACK
1.0000 | PACK | Freq: Every day | ORAL | Status: DC
  Administered 2017-12-17 – 2017-12-19 (×3): 1 via ORAL
  Filled 2017-12-17 (×4): qty 1

## 2017-12-17 MED ORDER — PHENYLEPHRINE 40 MCG/ML (10ML) SYRINGE FOR IV PUSH (FOR BLOOD PRESSURE SUPPORT)
PREFILLED_SYRINGE | INTRAVENOUS | Status: DC | PRN
  Administered 2017-12-17: 80 ug via INTRAVENOUS
  Administered 2017-12-17: 40 ug via INTRAVENOUS

## 2017-12-17 MED ORDER — FENTANYL CITRATE (PF) 100 MCG/2ML IJ SOLN
INTRAMUSCULAR | Status: DC | PRN
  Administered 2017-12-17: 25 ug via INTRAVENOUS

## 2017-12-17 MED ORDER — ATORVASTATIN CALCIUM 80 MG PO TABS
80.0000 mg | ORAL_TABLET | Freq: Every evening | ORAL | Status: DC
  Administered 2017-12-17 – 2017-12-18 (×2): 80 mg via ORAL
  Filled 2017-12-17 (×2): qty 1

## 2017-12-17 MED ORDER — LIDOCAINE 2% (20 MG/ML) 5 ML SYRINGE
INTRAMUSCULAR | Status: AC
  Filled 2017-12-17: qty 5

## 2017-12-17 MED ORDER — METOPROLOL SUCCINATE ER 100 MG PO TB24
100.0000 mg | ORAL_TABLET | Freq: Every day | ORAL | Status: DC
  Administered 2017-12-17 – 2017-12-19 (×3): 100 mg via ORAL
  Filled 2017-12-17 (×3): qty 1

## 2017-12-17 MED ORDER — ALBUTEROL SULFATE (2.5 MG/3ML) 0.083% IN NEBU: 3.0000 mL | INHALATION_SOLUTION | RESPIRATORY_TRACT | Status: DC | PRN

## 2017-12-17 MED ORDER — 0.9 % SODIUM CHLORIDE (POUR BTL) OPTIME
TOPICAL | Status: DC | PRN
  Administered 2017-12-17: 1000 mL

## 2017-12-17 SURGICAL SUPPLY — 42 items
BANDAGE ACE 4X5 VEL STRL LF (GAUZE/BANDAGES/DRESSINGS) IMPLANT
BANDAGE ACE 6X5 VEL STRL LF (GAUZE/BANDAGES/DRESSINGS) IMPLANT
BNDG GAUZE ELAST 4 BULKY (GAUZE/BANDAGES/DRESSINGS) ×2 IMPLANT
CANISTER SUCT 3000ML PPV (MISCELLANEOUS) ×3 IMPLANT
CLIP VESOCCLUDE MED 6/CT (CLIP) ×1 IMPLANT
CLIP VESOCCLUDE SM WIDE 6/CT (CLIP) ×3 IMPLANT
COVER SURGICAL LIGHT HANDLE (MISCELLANEOUS) ×3 IMPLANT
DRAPE EXTREMITY T 121X128X90 (DRAPE) ×2 IMPLANT
DRAPE HALF SHEET 40X57 (DRAPES) IMPLANT
DRAPE U-SHAPE 76X120 STRL (DRAPES) IMPLANT
ELECT REM PT RETURN 9FT ADLT (ELECTROSURGICAL) ×3
ELECTRODE REM PT RTRN 9FT ADLT (ELECTROSURGICAL) ×1 IMPLANT
GAUZE SPONGE 4X4 12PLY STRL (GAUZE/BANDAGES/DRESSINGS) ×1 IMPLANT
GAUZE SPONGE 4X4 12PLY STRL LF (GAUZE/BANDAGES/DRESSINGS) ×2 IMPLANT
GAUZE XEROFORM 5X9 LF (GAUZE/BANDAGES/DRESSINGS) IMPLANT
GLOVE BIOGEL PI IND STRL 6.5 (GLOVE) IMPLANT
GLOVE BIOGEL PI IND STRL 7.0 (GLOVE) IMPLANT
GLOVE BIOGEL PI IND STRL 7.5 (GLOVE) ×1 IMPLANT
GLOVE BIOGEL PI INDICATOR 6.5 (GLOVE) ×2
GLOVE BIOGEL PI INDICATOR 7.0 (GLOVE) ×2
GLOVE BIOGEL PI INDICATOR 7.5 (GLOVE) ×2
GLOVE ECLIPSE 6.5 STRL STRAW (GLOVE) ×2 IMPLANT
GLOVE SURG SS PI 7.5 STRL IVOR (GLOVE) ×3 IMPLANT
GOWN STRL REUS W/ TWL LRG LVL3 (GOWN DISPOSABLE) ×2 IMPLANT
GOWN STRL REUS W/ TWL XL LVL3 (GOWN DISPOSABLE) ×1 IMPLANT
GOWN STRL REUS W/TWL LRG LVL3 (GOWN DISPOSABLE) ×2
GOWN STRL REUS W/TWL XL LVL3 (GOWN DISPOSABLE) ×2
KIT BASIN OR (CUSTOM PROCEDURE TRAY) ×3 IMPLANT
KIT TURNOVER KIT B (KITS) ×3 IMPLANT
NS IRRIG 1000ML POUR BTL (IV SOLUTION) ×3 IMPLANT
PACK CV ACCESS (CUSTOM PROCEDURE TRAY) IMPLANT
PACK GENERAL/GYN (CUSTOM PROCEDURE TRAY) ×3 IMPLANT
PACK UNIVERSAL I (CUSTOM PROCEDURE TRAY) ×1 IMPLANT
PAD ARMBOARD 7.5X6 YLW CONV (MISCELLANEOUS) ×6 IMPLANT
SUT ETHILON 3 0 PS 1 (SUTURE) ×2 IMPLANT
SUT VIC AB 2-0 CTX 36 (SUTURE) IMPLANT
SUT VIC AB 3-0 SH 27 (SUTURE)
SUT VIC AB 3-0 SH 27X BRD (SUTURE) IMPLANT
SUT VICRYL 4-0 PS2 18IN ABS (SUTURE) IMPLANT
TAPE CLOTH SURG 4X10 WHT LF (GAUZE/BANDAGES/DRESSINGS) ×2 IMPLANT
TOWEL GREEN STERILE (TOWEL DISPOSABLE) ×3 IMPLANT
WATER STERILE IRR 1000ML POUR (IV SOLUTION) ×3 IMPLANT

## 2017-12-17 NOTE — Anesthesia Postprocedure Evaluation (Signed)
Anesthesia Post Note  Patient: AMALIO LOE  Procedure(s) Performed: IRRIGATION AND DEBRIDEMENT RIGHT GREAT TOE (Right )     Patient location during evaluation: PACU Anesthesia Type: General Level of consciousness: awake and alert Pain management: pain level controlled Vital Signs Assessment: post-procedure vital signs reviewed and stable Respiratory status: spontaneous breathing, nonlabored ventilation, respiratory function stable and patient connected to nasal cannula oxygen Cardiovascular status: blood pressure returned to baseline and stable Postop Assessment: no apparent nausea or vomiting Anesthetic complications: no    Last Vitals:  Vitals:   12/17/17 0845 12/17/17 0900  BP: (!) 114/59 (!) 107/54  Pulse: 62 (!) 55  Resp: 17 18  Temp:  36.5 C  SpO2: 98% 94%    Last Pain:  Vitals:   12/17/17 0830  TempSrc:   PainSc: 0-No pain                 Audry Pili

## 2017-12-17 NOTE — Anesthesia Procedure Notes (Signed)
Procedure Name: LMA Insertion Date/Time: 12/17/2017 7:58 AM Performed by: Sammie Bench, CRNA Pre-anesthesia Checklist: Patient identified, Emergency Drugs available, Suction available and Patient being monitored Patient Re-evaluated:Patient Re-evaluated prior to induction Oxygen Delivery Method: Circle System Utilized Preoxygenation: Pre-oxygenation with 100% oxygen Induction Type: IV induction Ventilation: Mask ventilation without difficulty LMA: LMA inserted LMA Size: 4.0 Number of attempts: 1 Airway Equipment and Method: Bite block Placement Confirmation: positive ETCO2 Tube secured with: Tape Dental Injury: Teeth and Oropharynx as per pre-operative assessment

## 2017-12-17 NOTE — Interval H&P Note (Signed)
History and Physical Interval Note:  12/17/2017 7:42 AM  Mike Wright  has presented today for surgery, with the diagnosis of infection right foot  The various methods of treatment have been discussed with the patient and family. After consideration of risks, benefits and other options for treatment, the patient has consented to  Procedure(s): IRRIGATION AND DEBRIDEMENT FOOT (Right) as a surgical intervention .  The patient's history has been reviewed, patient examined, no change in status, stable for surgery.  I have reviewed the patient's chart and labs.  Questions were answered to the patient's satisfaction.     [name]

## 2017-12-17 NOTE — Op Note (Signed)
    Patient name: Mike Wright MRN: [id number] DOB: 05-03-1933 Sex: male  12/17/2017 Pre-operative Diagnosis: right great toe wound Post-operative diagnosis:  Same Surgeon:  [name] Assistants:  none Procedure:   I& D right great toe, including bone Anesthesia:  GEneral Blood Loss:  minmal Specimens:  Cultures sent to micro  Findings:  Good bleeding from wound bed.  Exposed bone was removed.  Tissue appeared healthy  Indications:  The patient has a history of bilateral great toe amputations following revascularization.  He was admitted for cellulitis of the right.  He come in today for debridememnt  Procedure:  The patient was identified in the holding area and taken to [facility] [address]  The patient was then placed supine on the table. general anesthesia was administered.  The patient was prepped and draped in the usual sterile fashion.  A time out was called and antibiotics were administered.  There was a pin hole opening where bone could be palpated.  I made a 1.5 cm incision over this area.  I used ronguers to remove the exposed bone, which was healthy.  The remaining tissue appeared healthy.  The wound was irrigated after cultures were obtained.  Gauze was packed in the wound which had good bleeding.  Sterile dressing was applied   Disposition:  To PACU stable   V. [name], M.D. [facility] Office: [phone] Pager:  [phone]

## 2017-12-17 NOTE — Transfer of Care (Signed)
Immediate Anesthesia Transfer of Care Note  Patient: Mike Wright  Procedure(s) Performed: IRRIGATION AND DEBRIDEMENT RIGHT GREAT TOE (Right )  Patient Location: PACU  Anesthesia Type:General  Level of Consciousness: awake, alert , oriented and patient cooperative  Airway & Oxygen Therapy: Patient Spontanous Breathing and Patient connected to nasal cannula oxygen  Post-op Assessment: Report given to RN and Post -op Vital signs reviewed and stable  Post vital signs: Reviewed  Last Vitals: 116/60, 67, 18, 94% Vitals Value Taken Time  BP 116/60 12/17/2017  8:30 AM  Temp    Pulse 69 12/17/2017  8:30 AM  Resp 19 12/17/2017  8:31 AM  SpO2 94 % 12/17/2017  8:30 AM  Vitals shown include unvalidated device data.  Last Pain:  Vitals:   12/16/17 2342  TempSrc: Oral  PainSc:          Complications: No apparent anesthesia complications

## 2017-12-17 NOTE — Anesthesia Preprocedure Evaluation (Addendum)
Anesthesia Evaluation  Patient identified by MRN, date of birth, ID band Patient awake    Reviewed: Allergy & Precautions, NPO status , Patient's Chart, lab work & pertinent test results  History of Anesthesia Complications Negative for: history of anesthetic complications  Airway Mallampati: II  TM Distance: >3 FB Neck ROM: Full    Dental  (+) Dental Advisory Given, Teeth Intact   Pulmonary sleep apnea , COPD,  COPD inhaler and oxygen dependent, former smoker,    breath sounds clear to auscultation       Cardiovascular hypertension, Pt. on medications + angina at rest + CAD, + Cardiac Stents, + Peripheral Vascular Disease and +CHF  + dysrhythmias Atrial Fibrillation  Rhythm:Regular Rate:Normal   '19 TEE - LVEF is normal. AV is mildly thickened, calcified No definite vegetation though cannot exclude colonization. Mild fixed plaquing of the thoracic aorta. Trace MR. Mild spontaneous swirling contrast in LA.  No evidence of thrombus in the atrial cavity or appendage. Mild TR.     Neuro/Psych  Poor vision s/p retinal hemorrhage  negative psych ROS   GI/Hepatic Neg liver ROS, hiatal hernia, GERD  Medicated,  Endo/Other  diabetes, Type 2, Insulin DependentHypothyroidism   Renal/GU negative Renal ROS  negative genitourinary   Musculoskeletal  (+) Arthritis ,   Abdominal   Peds  Hematology  (+) anemia ,  Hx HIT    Anesthesia Other Findings   Reproductive/Obstetrics                            Anesthesia Physical Anesthesia Plan  ASA: III  Anesthesia Plan: General   Post-op Pain Management:    Induction: Intravenous  PONV Risk Score and Plan: 2 and Treatment may vary due to age or medical condition, Ondansetron and Propofol infusion  Airway Management Planned: LMA  Additional Equipment: None  Intra-op Plan:   Post-operative Plan: Extubation in OR  Informed Consent: I have  reviewed the patients History and Physical, chart, labs and discussed the procedure including the risks, benefits and alternatives for the proposed anesthesia with the patient or authorized representative who has indicated his/her understanding and acceptance.   Dental advisory given  Plan Discussed with: CRNA and Anesthesiologist  Anesthesia Plan Comments:        Anesthesia Quick Evaluation

## 2017-12-18 ENCOUNTER — Ambulatory Visit: Payer: Medicare HMO | Admitting: Surgery

## 2017-12-18 ENCOUNTER — Encounter (HOSPITAL_COMMUNITY): Payer: Self-pay | Admitting: Surgery

## 2017-12-18 ENCOUNTER — Ambulatory Visit: Payer: Medicare HMO | Admitting: Cardiology

## 2017-12-18 LAB — BASIC METABOLIC PANEL
Anion gap: 9 (ref 5–15)
BUN: 13 mg/dL (ref 8–23)
CALCIUM: 8.1 mg/dL — AB (ref 8.9–10.3)
CO2: 26 mmol/L (ref 22–32)
CREATININE: 1.14 mg/dL (ref 0.61–1.24)
Chloride: 99 mmol/L (ref 98–111)
GFR calc non Af Amer: 58 mL/min — ABNORMAL LOW (ref >60)
Glucose, Bld: 244 mg/dL — ABNORMAL HIGH (ref 70–99)
Potassium: 3.8 mmol/L (ref 3.5–5.1)
SODIUM: 134 mmol/L — AB (ref 135–145)

## 2017-12-18 LAB — GLUCOSE, CAPILLARY
GLUCOSE-CAPILLARY: 222 mg/dL — AB (ref 70–99)
Glucose-Capillary: 195 mg/dL — ABNORMAL HIGH (ref 70–99)
Glucose-Capillary: 199 mg/dL — ABNORMAL HIGH (ref 70–99)
Glucose-Capillary: 218 mg/dL — ABNORMAL HIGH (ref 70–99)

## 2017-12-18 NOTE — Care Management Important Message (Signed)
Important Message  Patient Details  Name: PROSPER PAFF MRN: [id number] Date of Birth: Jan 06, 1934   Medicare Important Message Given:  Yes    Ettore Trebilcock P Melessa Cowell 12/18/2017, 11:50 AM

## 2017-12-18 NOTE — Evaluation (Signed)
Physical Therapy Evaluation Patient Details Name: Mike Wright MRN: [id number] DOB: [birth year] Today's Date: 12/18/2017   History of Present Illness  Pt adm for I&D of amputation site of rt great toe. PMH - bil great toe amputations 08/2017, dm, htn, cad, PVD, copd, chf  Clinical Impression  Pt presents to PT with slight decrease in mobility from baseline after I&D of rt great toe amputation site. Pt reports he had post op shoes after surgeries in June for both feet but that they caused incr falls so he doesn't wear them. Pt reports frequent falls at home as he also reported prior to toe amputations in June. Feel pt is close to his baseline. Will continue to follow here and recommend he resume HHPT at time of DC.     Follow Up Recommendations Home health PT(resume )    Equipment Recommendations  None recommended by PT    Recommendations for Other Services       Precautions / Restrictions Precautions Precautions: Fall Restrictions Other Position/Activity Restrictions: Instructed pt to weight bear on heel of rt foot      Mobility  Bed Mobility Overal bed mobility: Modified Independent                Transfers Overall transfer level: Needs assistance Equipment used: Rolling walker (2 wheeled) Transfers: Sit to/from Stand Sit to Stand: Min guard         General transfer comment: verbal cues for hand placement  Ambulation/Gait Ambulation/Gait assistance: Min guard Gait Distance (Feet): 150 Feet Assistive device: Rolling walker (2 wheeled) Gait Pattern/deviations: Step-through pattern;Decreased step length - right;Decreased step length - left Gait velocity: decr Gait velocity interpretation: 1.31 - 2.62 ft/sec, indicative of limited community ambulator General Gait Details: Assist for safety. Verbal cues to bear weight on heel  Stairs            Wheelchair Mobility    Modified Rankin (Stroke Patients Only)       Balance Overall balance assessment:  Needs assistance Sitting-balance support: No upper extremity supported;Feet supported Sitting balance-Leahy Scale: Good     Standing balance support: Bilateral upper extremity supported Standing balance-Leahy Scale: Poor Standing balance comment: walker and supervision for static standing                             Pertinent Vitals/Pain Pain Assessment: No/denies pain    Home Living Family/patient expects to be discharged to:: Private residence Living Arrangements: Spouse/significant other Available Help at Discharge: Family;Available 24 hours/day Type of Home: House Home Access: Stairs to enter Entrance Stairs-Rails: Left Entrance Stairs-Number of Steps: 2 Home Layout: Two level;Able to live on main level with bedroom/bathroom Home Equipment: [name] - 2 wheels;Cane - single point;Bedside commode;Shower seat;Grab bars - toilet;Crutches      Prior Function Level of Independence: Independent with assistive device(s)         Comments: Pt amb with rolling walker. Pt with history of frequent falls.     Hand Dominance        Extremity/Trunk Assessment   Upper Extremity Assessment Upper Extremity Assessment: Overall WFL for tasks assessed    Lower Extremity Assessment Lower Extremity Assessment: Generalized weakness       Communication   Communication: No difficulties  Cognition Arousal/Alertness: Awake/alert Behavior During Therapy: WFL for tasks assessed/performed Overall Cognitive Status: Within Functional Limits for tasks assessed  General Comments      Exercises     Assessment/Plan    PT Assessment Patient needs continued PT services  PT Problem List Decreased strength;Decreased balance;Decreased mobility       PT Treatment Interventions DME instruction;Gait training;Stair training;Functional mobility training;Therapeutic activities;Therapeutic exercise;Balance  training;Patient/family education    PT Goals (Current goals can be found in the Care Plan section)  Acute Rehab PT Goals Patient Stated Goal: return home PT Goal Formulation: With patient Time For Goal Achievement: 12/25/17 Potential to Achieve Goals: Good    Frequency Min 3X/week   Barriers to discharge        Co-evaluation               AM-PAC PT "6 Clicks" Daily Activity  Outcome Measure Difficulty turning over in bed (including adjusting bedclothes, sheets and blankets)?: None Difficulty moving from lying on back to sitting on the side of the bed? : None Difficulty sitting down on and standing up from a chair with arms (e.g., wheelchair, bedside commode, etc,.)?: Unable Help needed moving to and from a bed to chair (including a wheelchair)?: A Little Help needed walking in hospital room?: A Little Help needed climbing 3-5 steps with a railing? : A Little 6 Click Score: 18    End of Session   Activity Tolerance: Patient tolerated treatment well Patient left: in chair;with call bell/phone within reach;with chair alarm set Nurse Communication: Mobility status PT Visit Diagnosis: Unsteadiness on feet (R26.81);Repeated falls (R29.6)    Time: 4360-6770 PT Time Calculation (min) (ACUTE ONLY): 19 min   Charges:   PT Evaluation $PT Eval Low Complexity: [place] Pager [phone] Office [place] 12/18/2017, 4:36 PM

## 2017-12-18 NOTE — Progress Notes (Signed)
Inpatient Diabetes Program Recommendations  AACE/ADA: New Consensus Statement on Inpatient Glycemic Control (2015)  Target Ranges:  Prepandial:   less than 140 mg/dL      Peak postprandial:   less than 180 mg/dL (1-2 hours)      Critically ill patients:  140 - 180 mg/dL   Lab Results  Component Value Date   GLUCAP 195 (H) 12/18/2017   HGBA1C 7.4 (H) 12/02/2017    Review of Glycemic Control  Diabetes history: DM2 Outpatient Diabetes medications: Lantus 40 units in am and 30 units QHS, metformin 1000 mg bid Current orders for Inpatient glycemic control: Novolog 0-15 units tidwc  HgbA1C - 7.4% - Doubtful this is accurate with low H/H. Blood sugars past 24H - 136-248 mg/dL.  Inpatient Diabetes Program Recommendations:     Add Novolog 2 units tidwc for meal coverage insulin if pt eats > 50% meal. Add Lantus 8 units QHS.  Will follow blood sugar trends.   Thank you. Lorenda Peck, RD, LDN, CDE Inpatient Diabetes Coordinator [phone]

## 2017-12-18 NOTE — Progress Notes (Signed)
dressing to right toe changed today. Wound looks clean Continue IV Abx Potential d/c tomorrow   [name]

## 2017-12-19 LAB — GLUCOSE, CAPILLARY
GLUCOSE-CAPILLARY: 223 mg/dL — AB (ref 70–99)
Glucose-Capillary: 239 mg/dL — ABNORMAL HIGH (ref 70–99)

## 2017-12-19 MED ORDER — DOXYCYCLINE HYCLATE 100 MG PO TABS: 100.0000 mg | ORAL_TABLET | Freq: Two times a day (BID) | ORAL | 0 refills | Status: DC

## 2017-12-19 MED ORDER — TRAMADOL HCL 50 MG PO TABS: 50.0000 mg | ORAL_TABLET | Freq: Four times a day (QID) | ORAL | 0 refills | Status: DC | PRN

## 2017-12-19 MED ORDER — METFORMIN HCL 500 MG PO TABS
1000.0000 mg | ORAL_TABLET | Freq: Two times a day (BID) | ORAL | Status: DC
  Administered 2017-12-19: 1000 mg via ORAL
  Filled 2017-12-19: qty 2

## 2017-12-19 NOTE — Progress Notes (Signed)
Pt educated and provided discharge instructions. Pt wife at bedside. All belongings with wife. IV removed and intact. Vitals stable. Pt denies any complaints. Pt tx via volunteers to [place] parking with wife at side. Jerald Kief

## 2017-12-19 NOTE — Progress Notes (Signed)
  Progress Note    12/19/2017 9:36 AM 2 Days Post-Op  Subjective:  Says he's had a little pain but not bad  Afebrile HR 70's-60's SB 259'D-638'V systolic 56% RA  Vitals:   12/19/17 0059 12/19/17 0900  BP: 138/62 (!) 172/64  Pulse:  77  Resp: (!) 25   Temp: 97.6 F (36.4 C) (!) 97.5 F (36.4 C)  SpO2: 90% 96%    Physical Exam: General:  No distress Lungs:  Non labored Incisions:  Wound is clean     CBC    Component Value Date/Time   WBC 3.5 (L) 12/16/2017 0246   RBC 3.70 (L) 12/16/2017 0246   HGB 8.9 (L) 12/16/2017 0246   HGB 12.3 (L) 05/30/2017 1531   HCT 28.5 (L) 12/16/2017 0246   HCT 37.7 05/30/2017 1531   PLT 223 12/16/2017 0246   PLT 212 05/30/2017 1531   MCV 77.0 (L) 12/16/2017 0246   MCV 86 05/30/2017 1531   MCH 24.1 (L) 12/16/2017 0246   MCHC 31.2 12/16/2017 0246   RDW 19.9 (H) 12/16/2017 0246   RDW 16.0 (H) 05/30/2017 1531   LYMPHSABS 0.7 12/02/2017 0950   MONOABS 0.4 12/02/2017 0950   EOSABS 0.0 12/02/2017 0950   BASOSABS 0.0 12/02/2017 0950    BMET    Component Value Date/Time   NA 134 (L) 12/18/2017 0315   NA 139 05/30/2017 1531   K 3.8 12/18/2017 0315   CL 99 12/18/2017 0315   CO2 26 12/18/2017 0315   GLUCOSE 244 (H) 12/18/2017 0315   BUN 13 12/18/2017 0315   BUN 13 05/30/2017 1531   CREATININE 1.14 12/18/2017 0315   CALCIUM 8.1 (L) 12/18/2017 0315   GFRNONAA 58 (L) 12/18/2017 0315   GFRAA >60 12/18/2017 0315    INR    Component Value Date/Time   INR 1.07 12/13/2017 1623     Intake/Output Summary (Last 24 hours) at 12/19/2017 0936 Last data filed at 12/19/2017 0900 Gross per 24 hour  Intake 1115.45 ml  Output 650 ml  Net 465.45 ml   Specimen Description WOUND RIGHT GREAT TOE   Special Requests PATIENT IS ON VANCOMYCIN   Gram Stain MODERATE WBC PRESENT, PREDOMINANTLY PMN  FEW GRAM POSITIVE COCCI IN PAIRS IN CLUSTERS  RARE GRAM POSITIVE RODS  Performed at [facility], [place] [address], [place], [place] [address]    Culture FEW METHICILLIN RESISTANT STAPHYLOCOCCUS AUREUS   Report Status 12/19/2017 FINAL      Assessment:  84 y.o. male is s/p:   I& D right great toe, including bone  2 Days Post-Op  Plan: -wound is clean-it is packed back with wet to dry 2x2 saline gauze -continue dressing changes -discussed with pharmacy on the floor that pt wound grew out MRSA and given allergies and sensitivities, most likely oral agent would be Doxycycline.  -Dr. Trula Slade to see pt later today.  Possibly home today -pt Metformin has not been restarted-will restart today.   Leontine Locket, PA-C [facility] [phone] 12/19/2017 9:36 AM

## 2017-12-19 NOTE — Discharge Instructions (Signed)
Wet to dry saline dressing changes to incision and drainage wound twice daily.  Santyl to right great toe superficial wound daily.  The wrap with kerlix. Heel weight bearing only.   Home health PT.

## 2017-12-19 NOTE — Discharge Summary (Signed)
Discharge Summary    Mike Wright [birth year] 84 y.o. male  [id number]  Admission Date: 12/13/2017  Discharge Date: 12/19/17  Physician: Mike Mitchell, MD  Admission Diagnosis: Infected Rt foot    HPI:   This is a 84 y.o. male who presents with chief complaint: non healing R GT amputation site.In June of this year Mike Wright underwent balloon angioplasty of left peroneal and posterior tibial arteries and 1 week later underwent balloon angioplasty of right posterior tibial artery with amputation of bilateral great toes.He was ultimately discharged from the hospital with a PICC line and several weeks of IV antibiotics. He presents to clinic today with a worsening discomfort and pain from his nonhealing right great toe amputation site. He is concerned that he will require an amputation of his right leg. He denies any fevers, chills, nausea/vomiting however does endorse theprogression of right foot edema, drainage, and erythema.He states that he does not have any pain or discomfort from left great toe amputation site and believes this is healing well. He is taking his Plavix daily. Past medical history significant for insulin-dependent diabetes mellitus.  Hospital Course:  The patient was admitted to the hospital and started on IV abx.  By the next morning, the pt states his foot has improved and looks and feels better.  The gram stain from the wound revealed few GPR and rare GPC.  Continue IV abx until sensitivities.    On HD 2, the medial left foot wound pinhole was being packed with wet to dry kerlix.  Edema/erythema improving.  Pt still at risk for amputation if current wound care fails.   On HD 3, the pt continued to have some purulent drainage from the tunnel to the bone.  He was made npo for the surgery the next day.    The pt was taken to the operating room on 12/17/2017 and underwent: I&D of the rigth great toe including bone.      Findings:  Good bleeding from  wound bed.  Exposed bone was removed.  Tissue appeared healthy  The pt tolerated the procedure well and was transported to the PACU in good condition.   On POD 1, the dressing was changed and wound was clean.  Continue IV abx.  On POD 2, wound was clean and packed with wet to dry saline 2x2 gauze.  Discussed with pharmacy on the floor that tp wound grew out MRSA and given allergies and sensitivities, the best agent would be Doxycycline.  Mike Wright wants pt to be on this for one month.  Pt's Metformin was restarted.   Discussed with Mike Wright after speaking with pharmacy and will discharge pt on Doxycycline 100mg  bid x one month.  He will f/u with Mike Wright in 2 weeks to check his wound.   The remainder of the hospital course consisted of increasing mobilization and increasing intake of solids without difficulty.  CBC    Component Value Date/Time   WBC 3.5 (L) 12/16/2017 0246   RBC 3.70 (L) 12/16/2017 0246   HGB 8.9 (L) 12/16/2017 0246   HGB 12.3 (L) 05/30/2017 1531   HCT 28.5 (L) 12/16/2017 0246   HCT 37.7 05/30/2017 1531   PLT 223 12/16/2017 0246   PLT 212 05/30/2017 1531   MCV 77.0 (L) 12/16/2017 0246   MCV 86 05/30/2017 1531   MCH 24.1 (L) 12/16/2017 0246   MCHC 31.2 12/16/2017 0246   RDW 19.9 (H) 12/16/2017 0246   RDW 16.0 (H) 05/30/2017 1531  LYMPHSABS 0.7 12/02/2017 0950   MONOABS 0.4 12/02/2017 0950   EOSABS 0.0 12/02/2017 0950   BASOSABS 0.0 12/02/2017 0950    BMET    Component Value Date/Time   NA 134 (L) 12/18/2017 0315   NA 139 05/30/2017 1531   K 3.8 12/18/2017 0315   CL 99 12/18/2017 0315   CO2 26 12/18/2017 0315   GLUCOSE 244 (H) 12/18/2017 0315   BUN 13 12/18/2017 0315   BUN 13 05/30/2017 1531   CREATININE 1.14 12/18/2017 0315   CALCIUM 8.1 (L) 12/18/2017 0315   GFRNONAA 58 (L) 12/18/2017 0315   GFRAA >60 12/18/2017 0315        Discharge Diagnosis:  Infected Rt foot   Secondary Diagnosis: Patient Active Problem List   Diagnosis Date  Noted  . PAD (peripheral artery disease) ([place]) 12/13/2017  . Non-healing surgical wound 12/13/2017  . Infected surgical wound 12/13/2017  . Paroxysmal atrial flutter ([place])   . Dyspnea 12/02/2017  . Pancytopenia ([place]) 12/02/2017  . Coronary artery disease involving native coronary artery of native heart with angina pectoris ([place]) 11/20/2017  . Abdominal pain   . Nausea without vomiting   . Acute on chronic anemia 11/18/2017  . Volume overload 11/18/2017  . Chest pain 11/17/2017  . Gangrene ([place])   . Urinary retention 09/16/2017  . MRSA bacteremia 09/15/2017  . Gangrene of bilateral great toes ([place]) 09/13/2017  . COPD (chronic obstructive pulmonary disease) ([place]) 05/19/2017  . Iron deficiency anemia due to chronic blood loss   . CAD S/P percutaneous coronary angioplasty   . Chronic diastolic CHF (congestive heart failure) ([place])   . PSVT (paroxysmal supraventricular tachycardia) ([place])   . PAF (paroxysmal atrial fibrillation) ([name])   . Sleep apnea   . Type 2 diabetes mellitus ([place])   . Status post coronary artery stent placement   . Essential hypertension 04/15/2010  . Diabetes ([place]) 04/14/2010   Past Medical History:  Diagnosis Date  . Anemia    a. mild, noted 04/2017.  [name] CAD in native artery    a. [name] 04/2017 s/p DES to D1, DES to prox-mid LAD, DES to prox LAD overlapping the prior stent, LVEF 55-65%.   . Chronic diastolic CHF (congestive heart failure) ([place])   . Diabetic ulcer of toe ([place])   . DJD (degenerative joint disease) of cervical spine   . Essential hypertension, benign   . GERD (gastroesophageal reflux disease)   . History of hiatal hernia   . HIT (heparin-induced thrombocytopenia) ([place])   . Hypothyroidism   . Hypoxia    a. went home on home O2 04/2017.  [name] Insomnia   . Mixed hyperlipidemia   . PAD (peripheral artery disease) ([place])   . PAF (paroxysmal atrial fibrillation) ([place])   . PVD (peripheral vascular disease) ([place])   . Renal insufficiency   . Retinal  hemorrhage    lost 90% of vision.  . Sinus bradycardia    a. HR 30s-40s in 04/2017 -> diltiazem stopped, metoprolol reduced.  . Sleep apnea    "chose not to order CPAP at this time" (05/18/2017)  . Type 2 diabetes mellitus ([place])   . Wheezing    a. suspected COPD 04/2017. Former tobacco x 40 years.     Allergies as of 12/19/2017      Reactions   Bactrim [sulfamethoxazole-trimethoprim]    Pancytopenia   Codeine Shortness Of Breath   Heparin Other (See Comments)   +HIT,  Severe bleeding (with heparin drip & large doses)  Losartan Swelling   Per ENT UNSPECIFIED REACTION    Other Other (See Comments)   Severe bleeding UNSPECIFIED AGENT    Oxycodone Other (See Comments)   "Made me act out of my mind" Other reaction(s): Other (See Comments) Mental status changes hallucinations      Medication List    TAKE these medications   acetaminophen 500 MG tablet Commonly known as:  TYLENOL Take 500 mg by mouth as needed for mild pain or headache.   albuterol (2.5 MG/3ML) 0.083% nebulizer solution Commonly known as:  PROVENTIL Inhale 3 mLs into the lungs every 4 (four) hours as needed for wheezing or shortness of breath.   amLODipine 10 MG tablet Commonly known as:  NORVASC Take 1 tablet (10 mg total) by mouth daily.   atorvastatin 80 MG tablet Commonly known as:  LIPITOR Take 1 tablet (80 mg total) by mouth every evening.   B-12 PO Take 1 tablet by mouth daily.   clopidogrel 75 MG tablet Commonly known as:  PLAVIX Take 1 tablet (75 mg total) by mouth daily.   collagenase ointment Commonly known as:  SANTYL Apply 1 application topically daily.   D3-1000 PO Take 1,000 Units by mouth daily.   doxycycline 100 MG tablet Commonly known as:  VIBRA-TABS Take 1 tablet (100 mg total) by mouth 2 (two) times daily.   finasteride 5 MG tablet Commonly known as:  PROSCAR Take 5 mg by mouth daily.   folic acid 1 MG tablet Commonly known as:  FOLVITE Take 1 tablet (1 mg total) by  mouth daily.   furosemide 40 MG tablet Commonly known as:  LASIX Take 1 tablet (40 mg total) by mouth daily.   insulin glargine 100 UNIT/ML injection Commonly known as:  LANTUS Inject 30-40 Units into the skin See admin instructions. Inject 40 units SQ in the morning and inject 30 units SQ at bedtime   meclizine 12.5 MG tablet Commonly known as:  ANTIVERT Take 1 tablet (12.5 mg total) by mouth 3 (three) times daily as needed for dizziness.   metFORMIN 1000 MG tablet Commonly known as:  GLUCOPHAGE Take 1,000 mg by mouth 2 (two) times daily.   metoprolol succinate 100 MG 24 hr tablet Commonly known as:  TOPROL-XL Take 1 tablet (100 mg total) by mouth daily.   nitroGLYCERIN 0.4 MG SL tablet Commonly known as:  NITROSTAT PLACE 1 TABLET (0.4 MG TOTAL) UNDER THE TONGUE EVERY 5  MINUTES AS NEEDED FOR CHEST PAIN. What changed:  See the new instructions.   pantoprazole 40 MG tablet Commonly known as:  PROTONIX Take 1 tablet (40 mg total) by mouth daily. To protect stomach while taking multiple blood thinners.   psyllium 58.6 % packet Commonly known as:  METAMUCIL Take 1 packet by mouth daily.   traMADol 50 MG tablet Commonly known as:  ULTRAM Take 1 tablet (50 mg total) by mouth every 6 (six) hours as needed for moderate pain.   traZODone 100 MG tablet Commonly known as:  DESYREL Take 100 mg by mouth at bedtime.       Prescriptions given: 1.  Tramadol#15 No Refill 2.  Doxycycline 100mg  bid #60 No Refill  Instructions: 1.  Wet to dry saline dressing changes to incision and drainage wound twice daily.  Santyl to right great toe superficial wound daily.  The wrap with kerlix. Heel weight bearing only.   Home health PT.   Disposition: home with [facility]  Patient's condition: is Good  Follow up: 1. Dr.  Brabham in 2 weeks   Leontine Locket, PA-C [facility] [phone] 12/19/2017  11:36 AM

## 2017-12-19 NOTE — Care Management Note (Addendum)
Case Management Note Marvetta Gibbons RN, BSN Unit 4E- RN Care Coordinator  [phone]  Patient Details  Name: Mike Wright MRN: [id number] Date of Birth: 08-May-1933  Subjective/Objective:   Pt admitted with infected surgical wound- s/p I&D                 Action/Plan: PTA pt lived at home with wife, has home 61 with [facility], and was active with [facility] for [facility]- per PT eval recommendation to add HHPT- orders have been placed for HHRN/PT for transition home. Pt has RW at home, no other DME needs noted. CM has notified Butch Penny with [facility] for resumption of [facility] services with addition of HHPT.   Expected Discharge Date:  12/19/17               Expected Discharge Plan:  [place]  In-House Referral:  NA  Discharge planning Services  CM Consult  Post Acute Care Choice:  Home Health, Resumption of Svcs/PTA Provider Choice offered to:  Patient  DME Arranged:    DME Agency:     HH Arranged:  RN, PT [place] Agency:  [facility]  Status of Service:  Completed, signed off  If discussed at [place] of Stay Meetings, dates discussed:    Discharge Disposition: Home/home health   Additional Comments:  Dawayne Patricia, RN 12/19/2017, 11:43 AM

## 2017-12-20 ENCOUNTER — Telehealth: Payer: Self-pay | Admitting: Surgery

## 2017-12-20 DIAGNOSIS — I110 Hypertensive heart disease with heart failure: Secondary | ICD-10-CM | POA: Diagnosis not present

## 2017-12-20 DIAGNOSIS — J449 Chronic obstructive pulmonary disease, unspecified: Secondary | ICD-10-CM | POA: Diagnosis not present

## 2017-12-20 DIAGNOSIS — I509 Heart failure, unspecified: Secondary | ICD-10-CM | POA: Diagnosis not present

## 2017-12-20 DIAGNOSIS — I2510 Atherosclerotic heart disease of native coronary artery without angina pectoris: Secondary | ICD-10-CM | POA: Diagnosis not present

## 2017-12-20 DIAGNOSIS — Z89412 Acquired absence of left great toe: Secondary | ICD-10-CM | POA: Diagnosis not present

## 2017-12-20 DIAGNOSIS — E1151 Type 2 diabetes mellitus with diabetic peripheral angiopathy without gangrene: Secondary | ICD-10-CM | POA: Diagnosis not present

## 2017-12-20 DIAGNOSIS — Z89411 Acquired absence of right great toe: Secondary | ICD-10-CM | POA: Diagnosis not present

## 2017-12-20 DIAGNOSIS — T8789 Other complications of amputation stump: Secondary | ICD-10-CM | POA: Diagnosis not present

## 2017-12-20 DIAGNOSIS — Z4800 Encounter for change or removal of nonsurgical wound dressing: Secondary | ICD-10-CM | POA: Diagnosis not present

## 2017-12-20 NOTE — Telephone Encounter (Signed)
sch appt spk to pt 01/01/18 930am p/o MD

## 2017-12-21 ENCOUNTER — Telehealth: Payer: Self-pay | Admitting: *Deleted

## 2017-12-21 NOTE — Telephone Encounter (Signed)
Attempted to return call to RN - lmtcb.

## 2017-12-21 NOTE — Telephone Encounter (Signed)
Spoke with Evalee Jefferson, RN with [facility] - just picked this patient up for wound care.  Eliquis was not on his discharge instructions.  Stated that she did call the surgeon and they would not advise on the Eliquis.  She stated that patient was going back on this medication on 12/23/2017 & wanted to make sure this was okay with provider.  Not clear as to who told him that specific date.

## 2017-12-21 NOTE — Telephone Encounter (Signed)
Received telephone call from Evalee Jefferson, RN [facility]. Patient is being discharged from [facility]. She has a question regarding patient being on Eliquis. Please call [phone].

## 2017-12-21 NOTE — Telephone Encounter (Signed)
Evalee Jefferson, RN notified.  I will send Dr. [name] a staff message to see where he would like to add this patient at as he has no openings this soon.  Our office will call the patient to give him the appointment after getting answer from provider.

## 2017-12-21 NOTE — Telephone Encounter (Signed)
A lot has happened since our last visit, I reviewed the hospital records and see that he was taken off eliquis for a period. If surgeons recommend staying off at this time Im ok with that. Can he f/u with me in 3 weeks to go over everything and reassess. Please clarify he is still taking plavix   Zandra Abts MD

## 2017-12-22 ENCOUNTER — Telehealth: Payer: Self-pay | Admitting: *Deleted

## 2017-12-22 DIAGNOSIS — J449 Chronic obstructive pulmonary disease, unspecified: Secondary | ICD-10-CM | POA: Diagnosis not present

## 2017-12-22 DIAGNOSIS — I110 Hypertensive heart disease with heart failure: Secondary | ICD-10-CM | POA: Diagnosis not present

## 2017-12-22 DIAGNOSIS — Z89412 Acquired absence of left great toe: Secondary | ICD-10-CM | POA: Diagnosis not present

## 2017-12-22 DIAGNOSIS — T8789 Other complications of amputation stump: Secondary | ICD-10-CM | POA: Diagnosis not present

## 2017-12-22 DIAGNOSIS — I2510 Atherosclerotic heart disease of native coronary artery without angina pectoris: Secondary | ICD-10-CM | POA: Diagnosis not present

## 2017-12-22 DIAGNOSIS — I509 Heart failure, unspecified: Secondary | ICD-10-CM | POA: Diagnosis not present

## 2017-12-22 DIAGNOSIS — Z89411 Acquired absence of right great toe: Secondary | ICD-10-CM | POA: Diagnosis not present

## 2017-12-22 DIAGNOSIS — Z4800 Encounter for change or removal of nonsurgical wound dressing: Secondary | ICD-10-CM | POA: Diagnosis not present

## 2017-12-22 DIAGNOSIS — E1151 Type 2 diabetes mellitus with diabetic peripheral angiopathy without gangrene: Secondary | ICD-10-CM | POA: Diagnosis not present

## 2017-12-22 LAB — AEROBIC/ANAEROBIC CULTURE (SURGICAL/DEEP WOUND)

## 2017-12-22 LAB — AEROBIC/ANAEROBIC CULTURE W GRAM STAIN (SURGICAL/DEEP WOUND)

## 2017-12-22 NOTE — Telephone Encounter (Signed)
Pt will come in 10/7 @ 12 pm

## 2017-12-22 NOTE — Telephone Encounter (Signed)
Pt aware has appt 10/7 @ 9:30am with vascular

## 2017-12-22 NOTE — Telephone Encounter (Signed)
-----   Message from Arnoldo Lenis, MD sent at 12/22/2017 11:58 AM EDT ----- Regarding: FW: 3 week appointment Please put patient with me Oct 7 at 12pm. Heard back from vascular, they are ok starting eliquis, we will discuss in detail at our appointment. Please copy this to his chart  Zandra Abts MD ----- Message ----- From: [name], LPN Sent: 05/12/8725   3:31 PM EDT To: Arnoldo Lenis, MD, Massie Maroon, CMA Subject: 3 week appointment                             See recent telephone note.  Please advise where & when you would like to add this patient.  You don't have anything available in 3 weeks.    Thanks,  Edd Fabian

## 2017-12-25 DIAGNOSIS — I2510 Atherosclerotic heart disease of native coronary artery without angina pectoris: Secondary | ICD-10-CM | POA: Diagnosis not present

## 2017-12-25 DIAGNOSIS — Z4800 Encounter for change or removal of nonsurgical wound dressing: Secondary | ICD-10-CM | POA: Diagnosis not present

## 2017-12-25 DIAGNOSIS — Z794 Long term (current) use of insulin: Secondary | ICD-10-CM | POA: Diagnosis not present

## 2017-12-25 DIAGNOSIS — Z89412 Acquired absence of left great toe: Secondary | ICD-10-CM | POA: Diagnosis not present

## 2017-12-25 DIAGNOSIS — T8789 Other complications of amputation stump: Secondary | ICD-10-CM | POA: Diagnosis not present

## 2017-12-25 DIAGNOSIS — E1151 Type 2 diabetes mellitus with diabetic peripheral angiopathy without gangrene: Secondary | ICD-10-CM | POA: Diagnosis not present

## 2017-12-25 DIAGNOSIS — Z6830 Body mass index (BMI) 30.0-30.9, adult: Secondary | ICD-10-CM | POA: Diagnosis not present

## 2017-12-25 DIAGNOSIS — E11621 Type 2 diabetes mellitus with foot ulcer: Secondary | ICD-10-CM | POA: Diagnosis not present

## 2017-12-25 DIAGNOSIS — I509 Heart failure, unspecified: Secondary | ICD-10-CM | POA: Diagnosis not present

## 2017-12-25 DIAGNOSIS — Z89411 Acquired absence of right great toe: Secondary | ICD-10-CM | POA: Diagnosis not present

## 2017-12-25 DIAGNOSIS — D6869 Other thrombophilia: Secondary | ICD-10-CM | POA: Diagnosis not present

## 2017-12-25 DIAGNOSIS — I110 Hypertensive heart disease with heart failure: Secondary | ICD-10-CM | POA: Diagnosis not present

## 2017-12-25 DIAGNOSIS — Z9981 Dependence on supplemental oxygen: Secondary | ICD-10-CM | POA: Diagnosis not present

## 2017-12-25 DIAGNOSIS — J9611 Chronic respiratory failure with hypoxia: Secondary | ICD-10-CM | POA: Diagnosis not present

## 2017-12-25 DIAGNOSIS — I4891 Unspecified atrial fibrillation: Secondary | ICD-10-CM | POA: Diagnosis not present

## 2017-12-25 DIAGNOSIS — M0600 Rheumatoid arthritis without rheumatoid factor, unspecified site: Secondary | ICD-10-CM | POA: Diagnosis not present

## 2017-12-25 DIAGNOSIS — I25118 Atherosclerotic heart disease of native coronary artery with other forms of angina pectoris: Secondary | ICD-10-CM | POA: Diagnosis not present

## 2017-12-25 DIAGNOSIS — G4733 Obstructive sleep apnea (adult) (pediatric): Secondary | ICD-10-CM | POA: Diagnosis not present

## 2017-12-25 DIAGNOSIS — J449 Chronic obstructive pulmonary disease, unspecified: Secondary | ICD-10-CM | POA: Diagnosis not present

## 2017-12-25 DIAGNOSIS — Z7901 Long term (current) use of anticoagulants: Secondary | ICD-10-CM | POA: Diagnosis not present

## 2017-12-26 DIAGNOSIS — Z4800 Encounter for change or removal of nonsurgical wound dressing: Secondary | ICD-10-CM | POA: Diagnosis not present

## 2017-12-26 DIAGNOSIS — I509 Heart failure, unspecified: Secondary | ICD-10-CM | POA: Diagnosis not present

## 2017-12-26 DIAGNOSIS — J449 Chronic obstructive pulmonary disease, unspecified: Secondary | ICD-10-CM | POA: Diagnosis not present

## 2017-12-26 DIAGNOSIS — I2510 Atherosclerotic heart disease of native coronary artery without angina pectoris: Secondary | ICD-10-CM | POA: Diagnosis not present

## 2017-12-26 DIAGNOSIS — E1151 Type 2 diabetes mellitus with diabetic peripheral angiopathy without gangrene: Secondary | ICD-10-CM | POA: Diagnosis not present

## 2017-12-26 DIAGNOSIS — I110 Hypertensive heart disease with heart failure: Secondary | ICD-10-CM | POA: Diagnosis not present

## 2017-12-26 DIAGNOSIS — T8789 Other complications of amputation stump: Secondary | ICD-10-CM | POA: Diagnosis not present

## 2017-12-26 DIAGNOSIS — Z89411 Acquired absence of right great toe: Secondary | ICD-10-CM | POA: Diagnosis not present

## 2017-12-26 DIAGNOSIS — Z89412 Acquired absence of left great toe: Secondary | ICD-10-CM | POA: Diagnosis not present

## 2017-12-28 DIAGNOSIS — Z4800 Encounter for change or removal of nonsurgical wound dressing: Secondary | ICD-10-CM | POA: Diagnosis not present

## 2017-12-28 DIAGNOSIS — I509 Heart failure, unspecified: Secondary | ICD-10-CM | POA: Diagnosis not present

## 2017-12-28 DIAGNOSIS — T8789 Other complications of amputation stump: Secondary | ICD-10-CM | POA: Diagnosis not present

## 2017-12-28 DIAGNOSIS — I110 Hypertensive heart disease with heart failure: Secondary | ICD-10-CM | POA: Diagnosis not present

## 2017-12-28 DIAGNOSIS — Z89411 Acquired absence of right great toe: Secondary | ICD-10-CM | POA: Diagnosis not present

## 2017-12-28 DIAGNOSIS — E1151 Type 2 diabetes mellitus with diabetic peripheral angiopathy without gangrene: Secondary | ICD-10-CM | POA: Diagnosis not present

## 2017-12-28 DIAGNOSIS — I2510 Atherosclerotic heart disease of native coronary artery without angina pectoris: Secondary | ICD-10-CM | POA: Diagnosis not present

## 2017-12-28 DIAGNOSIS — Z89412 Acquired absence of left great toe: Secondary | ICD-10-CM | POA: Diagnosis not present

## 2017-12-28 DIAGNOSIS — J449 Chronic obstructive pulmonary disease, unspecified: Secondary | ICD-10-CM | POA: Diagnosis not present

## 2017-12-29 DIAGNOSIS — Z89411 Acquired absence of right great toe: Secondary | ICD-10-CM | POA: Diagnosis not present

## 2017-12-29 DIAGNOSIS — I110 Hypertensive heart disease with heart failure: Secondary | ICD-10-CM | POA: Diagnosis not present

## 2017-12-29 DIAGNOSIS — Z4800 Encounter for change or removal of nonsurgical wound dressing: Secondary | ICD-10-CM | POA: Diagnosis not present

## 2017-12-29 DIAGNOSIS — Z89412 Acquired absence of left great toe: Secondary | ICD-10-CM | POA: Diagnosis not present

## 2017-12-29 DIAGNOSIS — J449 Chronic obstructive pulmonary disease, unspecified: Secondary | ICD-10-CM | POA: Diagnosis not present

## 2017-12-29 DIAGNOSIS — I509 Heart failure, unspecified: Secondary | ICD-10-CM | POA: Diagnosis not present

## 2017-12-29 DIAGNOSIS — T8789 Other complications of amputation stump: Secondary | ICD-10-CM | POA: Diagnosis not present

## 2017-12-29 DIAGNOSIS — E1151 Type 2 diabetes mellitus with diabetic peripheral angiopathy without gangrene: Secondary | ICD-10-CM | POA: Diagnosis not present

## 2017-12-29 DIAGNOSIS — I2510 Atherosclerotic heart disease of native coronary artery without angina pectoris: Secondary | ICD-10-CM | POA: Diagnosis not present

## 2018-01-01 ENCOUNTER — Other Ambulatory Visit: Payer: Self-pay

## 2018-01-01 ENCOUNTER — Encounter: Payer: Self-pay | Admitting: Surgery

## 2018-01-01 ENCOUNTER — Ambulatory Visit (INDEPENDENT_AMBULATORY_CARE_PROVIDER_SITE_OTHER): Payer: Medicare HMO | Admitting: Cardiology

## 2018-01-01 ENCOUNTER — Ambulatory Visit (INDEPENDENT_AMBULATORY_CARE_PROVIDER_SITE_OTHER): Payer: Self-pay | Admitting: Surgery

## 2018-01-01 ENCOUNTER — Encounter: Payer: Self-pay | Admitting: Cardiology

## 2018-01-01 VITALS — BP 123/63 | HR 57 | Temp 97.1°F | Resp 20 | Ht 67.0 in | Wt 200.0 lb

## 2018-01-01 VITALS — BP 131/68 | HR 53 | Ht 67.0 in | Wt 201.4 lb

## 2018-01-01 DIAGNOSIS — I10 Essential (primary) hypertension: Secondary | ICD-10-CM

## 2018-01-01 DIAGNOSIS — D649 Anemia, unspecified: Secondary | ICD-10-CM

## 2018-01-01 DIAGNOSIS — I5032 Chronic diastolic (congestive) heart failure: Secondary | ICD-10-CM

## 2018-01-01 DIAGNOSIS — I4891 Unspecified atrial fibrillation: Secondary | ICD-10-CM | POA: Diagnosis not present

## 2018-01-01 DIAGNOSIS — I739 Peripheral vascular disease, unspecified: Secondary | ICD-10-CM

## 2018-01-01 DIAGNOSIS — I2510 Atherosclerotic heart disease of native coronary artery without angina pectoris: Secondary | ICD-10-CM | POA: Diagnosis not present

## 2018-01-01 NOTE — Patient Instructions (Signed)
Medication Instructions:  Continue all current medications.  Labwork: none  Testing/Procedures: none  Follow-Up: 3 months   Any Other Special Instructions Will Be Listed Below (If Applicable).  If you need a refill on your cardiac medications before your next appointment, please call your pharmacy.  

## 2018-01-01 NOTE — Progress Notes (Signed)
Clinical Summary Mr. Bontempo is a 84 y.o.male seen today for follow up of the following medical problems.   1. CAD - nonobstructive CAD by cath Jan 2012, LVEF 60-65% by LV gram.  - 06/2015 nuclear stress without clear ischemia - 05/2015 echo LVEF 65-70%, no WMAs, cannot evaluate diastolic function 09/2534 lexiscan without ischemia, low risk study.    - last visit due to progressing chest and SOB/DOE he was referred for [facility] - cath 04/2017 as reported below. Received DES to 75% D1, DES to 75% mid to distal LAD, DES to 70% prox LAD RHC with CI 2.67, mean PA 25, no wedge reported by LVEDP 14.  - discharged on triple therapy with ASA, plavix, eliquis with plan for 30 days, then stop ASA.   - 11/22/17 admission with chest pain, anemia. Negative workup for ACS. Symptoms resolved with blood transfusion - 11/2017 admit with chest pain and dyspnea. ACS work negative, CT PE negative. Diuresed 6L with resolustion of symptoms. CXR with rib fracture thought playing a role in chest pain.    - rare chest pain since discharge, takes NG about once every 2 weeks with improvement.    2. Iron deficient anemia - admit 10/2017 with anemia, transfused. EGD that admission without acute findings.  - eliquis held, plavix continued given recent stent - denies any blood in stools or urine.  - upcoming labs today with pcp.  - restarted eliquis 12/23/17.  - cannot take oral iron due to GI upset    2. COPD - followed by pulmonary   3. PAD - followed by vascular - admission 08/2017 with ischemia great toes. Had intervention on lower extremity vessels at that time.  - ultimately required ampuation bilateral great toes  - admit 11/2017 for poor healing right great toe amputation, critical limb ischemia - had I&D done of toe during that admission. Started on abx.    4. PSVT/Palpitations. - no significant symptoms.   4. Afib - dilt stop during 04/2017 admission due to bradycardia.  Metoptolol decreased due to wheezing during that admission  - no recent palpitiations. Eliquis held initially due to anemia, back on starting about 1 week ago.    5. Chronic diastolic HF - lasix increased to 40mg  daily during recent admision, doing well since discharge.  - home weights stable around 196 lbs    6. HTN - several med changes during 04/2017. Dilt 240 stopped, HCTZ stopped. Started on norvasc 5.  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 Anemia    a. mild, noted 04/2017.  [name] CAD in native artery    a. [name] 04/2017 s/p DES to D1, DES to prox-mid LAD, DES to prox LAD overlapping the prior stent, LVEF 55-65%.   . Chronic diastolic CHF (congestive heart failure) ([place])   . Diabetic ulcer of toe ([place])   . DJD (degenerative joint disease) of cervical spine   . Essential hypertension, benign   . GERD (gastroesophageal reflux disease)   . History of hiatal hernia   . HIT (heparin-induced thrombocytopenia) ([place])   . Hypothyroidism   . Hypoxia    a. went home on home O2 04/2017.  [name] Insomnia   . Mixed hyperlipidemia   . PAD (peripheral artery  disease) ([place])   . PAF (paroxysmal atrial fibrillation) ([place])   . PVD (peripheral vascular disease) ([place])   . Renal insufficiency   . 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  . Sinus bradycardia    a. HR 30s-40s in 04/2017 -> diltiazem stopped, metoprolol reduced.  . Sleep apnea    "chose not to order CPAP at this time" (05/18/2017)  . Type 2 diabetes mellitus ([place])   . Wheezing    a. suspected COPD 04/2017. Former tobacco x 40 years.     Allergies  Allergen Reactions  . Bactrim [Sulfamethoxazole-Trimethoprim]     Pancytopenia  . Codeine Shortness Of Breath  . Heparin Other (See Comments)    +HIT,  Severe bleeding  (with heparin drip & large doses)  . Losartan Swelling    Per ENT UNSPECIFIED REACTION   . Other Other (See Comments)    Severe bleeding UNSPECIFIED AGENT   . Oxycodone Other (See Comments)    "Made me act out of my mind" Other reaction(s): Other (See Comments) Mental status changes hallucinations     Current Outpatient Medications  Medication Sig Dispense Refill  . acetaminophen (TYLENOL) 500 MG tablet Take 500 mg by mouth as needed for mild pain or headache.    . albuterol (PROVENTIL) (2.5 MG/3ML) 0.083% nebulizer solution Inhale 3 mLs into the lungs every 4 (four) hours as needed for wheezing or shortness of breath. 75 mL 12  . amLODipine (NORVASC) 10 MG tablet Take 1 tablet (10 mg total) by mouth daily. 90 tablet 3  . atorvastatin (LIPITOR) 80 MG tablet Take 1 tablet (80 mg total) by mouth every evening. 30 tablet 0  . Cholecalciferol (D3-1000 PO) Take 1,000 Units by mouth daily.     . clopidogrel (PLAVIX) 75 MG tablet Take 1 tablet (75 mg total) by mouth daily. 30 tablet 0  . collagenase (SANTYL) ointment Apply 1 application topically daily. 30 g 1  . Cyanocobalamin (B-12 PO) Take 1 tablet by mouth daily.     [name] doxycycline (VIBRA-TABS) 100 MG tablet Take 1 tablet (100 mg total) by mouth 2 (two) times daily. 60 tablet 0  . finasteride (PROSCAR) 5 MG tablet Take 5 mg by mouth daily.     . folic acid (FOLVITE) 1 MG tablet Take 1 tablet (1 mg total) by mouth daily. 30 tablet 1  . furosemide (LASIX) 40 MG tablet Take 1 tablet (40 mg total) by mouth daily. 30 tablet 2  . insulin glargine (LANTUS) 100 UNIT/ML injection Inject 30-40 Units into the skin See admin instructions. Inject 40 units SQ in the morning and inject 30 units SQ at bedtime    . meclizine (ANTIVERT) 12.5 MG tablet Take 1 tablet (12.5 mg total) by mouth 3 (three) times daily as needed for dizziness. 30 tablet 0  . metFORMIN (GLUCOPHAGE) 1000 MG tablet Take 1,000 mg by mouth 2 (two) times daily.    . metoprolol succinate  (TOPROL-XL) 100 MG 24 hr tablet Take 1 tablet (100 mg total) by mouth daily. 30 tablet 1  . nitroGLYCERIN (NITROSTAT) 0.4 MG SL tablet PLACE 1 TABLET (0.4 MG TOTAL) UNDER THE TONGUE EVERY 5  MINUTES AS NEEDED FOR CHEST PAIN. (Patient taking differently: Place 0.4 mg under the tongue every 5 (five) minutes as needed. ) 25 tablet 3  . pantoprazole (PROTONIX) 40 MG tablet Take 1 tablet (40 mg total) by mouth daily. To protect stomach while taking multiple blood thinners. 30 tablet 2  . psyllium (METAMUCIL)  58.6 % packet Take 1 packet by mouth daily.    . traMADol (ULTRAM) 50 MG tablet Take 1 tablet (50 mg total) by mouth every 6 (six) hours as needed for moderate pain. 15 tablet 0  . traZODone (DESYREL) 100 MG tablet Take 100 mg by mouth at bedtime.      No current facility-administered medications for this visit.      Past Surgical History:  Procedure Laterality Date  . ABDOMINAL AORTOGRAM W/LOWER EXTREMITY N/A 09/15/2017   Procedure: ABDOMINAL AORTOGRAM W/LOWER EXTREMITY;  Surgeon: Serafina Mitchell, MD;  Location: [place] [facility];  Service: Cardiovascular;  Laterality: N/A;  . AMPUTATION Bilateral 09/20/2017   Procedure: BILATERAL GREAT TOE AMPUTATIONS INCLUDING METATARSAL HEADS;  Surgeon: Serafina Mitchell, MD;  Location: [facility];  Service: Vascular;  Laterality: Bilateral;  . ANGIOPLASTY Right 09/20/2017   Procedure: ANGIOPLASTY RIGHT TIBIAL ARTERY;  Surgeon: Serafina Mitchell, MD;  Location: [place];  Service: Vascular;  Laterality: Right;  . APPENDECTOMY    . BACK SURGERY    . CARDIAC CATHETERIZATION  1980s; 2012;  . CATARACT EXTRACTION Left 2004  . COLONOSCOPY  2004   FLEISHMAN TICS  . CORONARY ANGIOPLASTY WITH STENT PLACEMENT  05/18/2017   "3 stents"  . CORONARY STENT INTERVENTION N/A 05/18/2017   Procedure: CORONARY STENT INTERVENTION;  Surgeon: Jettie Booze, MD;  Location: [place] [facility];  Service: Cardiovascular;  Laterality: N/A;  . ESOPHAGOGASTRODUODENOSCOPY (EGD) WITH  PROPOFOL N/A 11/20/2017   Procedure: ESOPHAGOGASTRODUODENOSCOPY (EGD) WITH PROPOFOL;  Surgeon: Irene Shipper, MD;  Location: [place];  Service: Gastroenterology;  Laterality: N/A;  . I&D EXTREMITY Right 12/17/2017   Procedure: IRRIGATION AND DEBRIDEMENT RIGHT GREAT TOE;  Surgeon: Serafina Mitchell, MD;  Location: [facility];  Service: Vascular;  Laterality: Right;  . JOINT REPLACEMENT    . LEFT HEART CATH AND CORONARY ANGIOGRAPHY N/A 05/18/2017   Procedure: LEFT HEART CATH AND CORONARY ANGIOGRAPHY;  Surgeon: Jettie Booze, MD;  Location: [place] [facility];  Service: Cardiovascular;  Laterality: N/A;  . LOWER EXTREMITY ANGIOGRAM Right 09/20/2017   Procedure: RIGHT LOWER LEG  ANGIOGRAM;  Surgeon: Serafina Mitchell, MD;  Location: [place];  Service: Vascular;  Laterality: Right;  . LUMBAR FUSION  2002   L3, 4 L4, 5 L5 S1 Fused by Dr. Glenna Fellows  . PERIPHERAL VASCULAR BALLOON ANGIOPLASTY Left 09/15/2017   Procedure: PERIPHERAL VASCULAR BALLOON ANGIOPLASTY;  Surgeon: Serafina Mitchell, MD;  Location: [place] [facility];  Service: Cardiovascular;  Laterality: Left;  PTA of Peroneal & Posterior Tibial  . POSTERIOR LUMBAR FUSION    . RHINOPLASTY    . RIGHT HEART CATH N/A 05/18/2017   Procedure: RIGHT HEART CATH;  Surgeon: Jettie Booze, MD;  Location: [place] [facility];  Service: Cardiovascular;  Laterality: N/A;  . TEE WITHOUT CARDIOVERSION N/A 09/18/2017   Procedure: TRANSESOPHAGEAL ECHOCARDIOGRAM (TEE);  Surgeon: Fay Records, MD;  Location: [place];  Service: Cardiovascular;  Laterality: N/A;  . TOTAL KNEE ARTHROPLASTY Bilateral   . TRANSURETHRAL RESECTION OF PROSTATE  2001   [place]     Allergies  Allergen Reactions  . Bactrim [Sulfamethoxazole-Trimethoprim]     Pancytopenia  . Codeine Shortness Of Breath  . Heparin Other (See Comments)    +HIT,  Severe bleeding (with heparin drip & large doses)  . Losartan Swelling    Per ENT UNSPECIFIED REACTION   . Other Other (See  Comments)    Severe bleeding UNSPECIFIED AGENT   .  Oxycodone Other (See Comments)    "Made me act out of my mind" Other reaction(s): Other (See Comments) Mental status changes hallucinations      Family History  Problem Relation Age of Onset  . Heart attack Father 37  . COPD Father   . COPD Mother   . Heart disease Mother   . Diabetes Mother   . Hypertension Sister   . CVA Sister   . Diabetes Sister   . Multiple sclerosis Sister      Social History Mr. Mecca reports that he quit smoking about 34 years ago. His smoking use included cigarettes. He started smoking about 64 years ago. He has a 60.00 pack-year smoking history. He has never used smokeless tobacco. Mr. Cookson reports that he drank alcohol.   Review of Systems CONSTITUTIONAL: No weight loss, fever, chills, weakness or fatigue.  HEENT: Eyes: No visual loss, blurred vision, double vision or yellow sclerae.No hearing loss, sneezing, congestion, runny nose or sore throat.  SKIN: No rash or itching.  CARDIOVASCULAR: per hpi RESPIRATORY: No shortness of breath, cough or sputum.  GASTROINTESTINAL: No anorexia, nausea, vomiting or diarrhea. No abdominal pain or blood.  GENITOURINARY: No burning on urination, no polyuria NEUROLOGICAL: No headache, dizziness, syncope, paralysis, ataxia, numbness or tingling in the extremities. No change in bowel or bladder control.  MUSCULOSKELETAL: No muscle, back pain, joint pain or stiffness.  LYMPHATICS: No enlarged nodes. No history of splenectomy.  PSYCHIATRIC: No history of depression or anxiety.  ENDOCRINOLOGIC: No reports of sweating, cold or heat intolerance. No polyuria or polydipsia.  [name]   Physical Examination Vitals:   01/01/18 1139  BP: 131/68  Pulse: (!) 53  SpO2: 99%   Vitals:   01/01/18 1139  Weight: 201 lb 6.4 oz (91.4 kg)  Height: 5\' 7"  (1.702 m)    Gen: resting comfortably, no acute distress HEENT: no scleral icterus, pupils equal round and reactive, no  palptable cervical adenopathy,  CV: RRR, 3/6 systolic murmur rusb, no jvd Resp: Clear to auscultation bilaterally GI: abdomen is soft, non-tender, non-distended, normal bowel sounds, no hepatosplenomegaly MSK: extremities are warm, no edema.  Skin: warm, no rash Neuro:  no focal deficits Psych: appropriate affect   Diagnostic Studies 05/2015 echo Study Conclusions  - Left ventricle: The cavity size was normal. Wall thickness was increased increased in a pattern of mild to moderate LVH. Systolic function was vigorous. The estimated ejection fraction was in the range of 65% to 70%. Wall motion was normal; there were no regional wall motion abnormalities. The study is not technically sufficient to allow evaluation of LV diastolic function. - Aortic valve: Moderately calcified annulus. Mildly thickened leaflets. There was mild stenosis. There was mild regurgitation. Mean gradient (S): 13 mm Hg. Valve area (VTI): 1.51 cm^2. - Mitral valve: Mildly calcified annulus. Normal thickness leaflets . - Left atrium: The atrium was moderately dilated. - Technically adequate study.   06/2015 Nuclear stress test  No diagnostic ST segment changes to indicate ischemia.  Small, mild intensity, perfusion defects noted in the apical anterior and basal inferolateral walls. This is most consistent with soft tissue attenuation given normal wall motion in these regions. No large ischemic zones noted.  This is a low risk study.  Nuclear stress EF: 64%.  05/2016 Event monitor  Rhythm is atrial fibrillation throughout study. Occasioanal PVCs  Min HR 51, Max HR 107, Avg HR 67  Reported symptoms correspond with rate controlled atrial fibrillation.  09/2016 nuclear stress  There was no  ST segment deviation noted during stress.  The study is normal. There are no perfusion defects consistent with prior infarct or current ischemia.  This is a low risk study.  The left  ventricular ejection fraction is normal (55-65%).   04/2017 cath  Dist RCA lesion is 40% stenosed.  Prox RCA lesion is 25% stenosed.  1st Diag lesion is 75% stenosed.  A drug-eluting stent was successfully placed using a STENT SYNERGY DES 2.25X12.  Post intervention, there is a 0% residual stenosis.  Prox LAD to Mid LAD lesion is 75% stenosed.  A drug-eluting stent was successfully placed using a STENT SYNERGY DES 3.5X38.  Post intervention, there is a 0% residual stenosis.  Prox LAD lesion is 70% stenosed.  A drug-eluting stent was successfully placed using a STENT SYNERGY DES 4X12, overlapping the prior stent.  Post intervention, there is a 0% residual stenosis.  The left ventricular systolic function is normal.  The left ventricular ejection fraction is 55-65% by visual estimate.  LV end diastolic pressure is normal.  There is no aortic valve stenosis.  Ao sat 98%, PA sat 65%; PA mean 25 mm Hg; unable to wedge the catheter.  Normal right heat pressures.  Tortuous right subclavian making catheter navigation difficult from the right radial approach.  Continue dual antiplatelet therapy along with Eliquis for 30 days.  Restart Eliquis tomorrow.  After 30 days, stop aspirin. Continue Plavix and Eliquis.   After 6 months, can consider stopping clopidogrel if he has bleeding issues. Given the nomber of stents, would try to complete one year of clopidogrel if no bleeding issues.    Assessment and Plan  1.CAD - PCI as reported above 04/2017 - recent symptoms have resolved with management of his anemia and fluid status, has not had evidence of recurrent ischemia - continue current meds       2. PSVT/Palpitations.  - no recent symptoms, continue current meds   3. Afib -no symptoms. Recent anemia, had been off eliquis now back. Has not had signs of bleeding, negative EGD. Has heme appt later this week, labs with pcp today - folllow up labs,  continue eliquis as long as Hgb is stable.    4. Chronic diastolic HF -recent admission with fluid overload. Appears euvolemic today, continue current meds  5. HTN -at goal, continue current meds  6. Fe deficient anemia - management per pcp and hematology - continue eliquis and plavix, if neccesary can hold eliquis again. Would avoid holding plavix due to stent 04/2017   Extensive medical records reviewed from multiple admissions since our last visit, multiple medical issues reviewed in complex patient. Extended visit of 40 minutes today.   Arnoldo Lenis, M.D.

## 2018-01-01 NOTE — Progress Notes (Signed)
Patient name: Mike Wright MRN: [id number] DOB: 1933-10-04 Sex: male  REASON FOR VISIT:    Follow-up  HISTORY OF PRESENT ILLNESS:   Mike Wright is a 84 y.o. male who is status post balloon angioplasty of the left peroneal and posterior tibial artery as well as the right posterior tibial artery in June 2019.  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  He has been getting wound care.  When he presented to the office in September, he had cellulitis on his foot and was admitted for IV antibiotics.  The cellulitis cleared up immediately.  He is back today for wound check.  CURRENT MEDICATIONS:    Current Outpatient Medications  Medication Sig Dispense Refill  . acetaminophen (TYLENOL) 500 MG tablet Take 500 mg by mouth as needed for mild pain or headache.    . albuterol (PROVENTIL) (2.5 MG/3ML) 0.083% nebulizer solution Inhale 3 mLs into the lungs every 4 (four) hours as needed for wheezing or shortness of breath. 75 mL 12  . amLODipine (NORVASC) 10 MG tablet Take 1 tablet (10 mg total) by mouth daily. 90 tablet 3  . atorvastatin (LIPITOR) 80 MG tablet Take 1 tablet (80 mg total) by mouth every evening. 30 tablet 0  . Cholecalciferol (D3-1000 PO) Take 1,000 Units by mouth daily.     . clopidogrel (PLAVIX) 75 MG tablet Take 1 tablet (75 mg total) by mouth daily. 30 tablet 0  . collagenase (SANTYL) ointment Apply 1 application topically daily. 30 g 1  . Cyanocobalamin (B-12 PO) Take 1 tablet by mouth daily.     [name] doxycycline (VIBRA-TABS) 100 MG tablet Take 1 tablet (100 mg total) by mouth 2 (two) times daily. 60 tablet 0  . finasteride (PROSCAR) 5 MG tablet Take 5 mg by mouth daily.     . folic acid (FOLVITE) 1 MG tablet Take 1 tablet (1 mg total) by mouth daily. 30 tablet 1  . furosemide (LASIX) 40 MG tablet Take 1 tablet (40 mg total) by mouth daily. 30 tablet 2  . insulin glargine (LANTUS) 100 UNIT/ML injection Inject 30-40 Units into  the skin See admin instructions. Inject 40 units SQ in the morning and inject 30 units SQ at bedtime    . meclizine (ANTIVERT) 12.5 MG tablet Take 1 tablet (12.5 mg total) by mouth 3 (three) times daily as needed for dizziness. 30 tablet 0  . metFORMIN (GLUCOPHAGE) 1000 MG tablet Take 1,000 mg by mouth 2 (two) times daily.    . metoprolol succinate (TOPROL-XL) 100 MG 24 hr tablet Take 1 tablet (100 mg total) by mouth daily. 30 tablet 1  . nitroGLYCERIN (NITROSTAT) 0.4 MG SL tablet PLACE 1 TABLET (0.4 MG TOTAL) UNDER THE TONGUE EVERY 5  MINUTES AS NEEDED FOR CHEST PAIN. (Patient taking differently: Place 0.4 mg under the tongue every 5 (five) minutes as needed. ) 25 tablet 3  . pantoprazole (PROTONIX) 40 MG tablet Take 1 tablet (40 mg total) by mouth daily. To protect stomach while taking multiple blood thinners. 30 tablet 2  . psyllium (METAMUCIL) 58.6 % packet Take 1 packet by mouth daily.    . traMADol (ULTRAM) 50 MG tablet Take 1 tablet (50 mg total) by mouth every 6 (six) hours as needed for moderate pain. 15 tablet 0  . traZODone (DESYREL) 100 MG tablet Take 100 mg by mouth at bedtime.      No current facility-administered medications for this visit.     REVIEW OF  SYSTEMS:   [X]  denotes positive finding, [ ]  denotes negative finding Cardiac  Comments:  Chest pain or chest pressure:    Shortness of breath upon exertion:    Short of breath when lying flat:    Irregular heart rhythm:    Constitutional    Fever or chills:      PHYSICAL EXAM:   Vitals:   01/01/18 0905  BP: 123/63  Pulse: (!) 57  Resp: 20  Temp: (!) 97.1 F (36.2 C)  TempSrc: Oral  SpO2: 97%  Weight: 200 lb (90.7 kg)  Height: 5\' 7"  (1.702 m)    GENERAL: The patient is a well-nourished male, in no acute distress. The vital signs are documented above. CARDIOVASCULAR: There is a regular rate and rhythm. PULMONARY: Non-labored respirations        STUDIES:   none   MEDICAL ISSUES:   Continues to  improve.  Has Abx for another 1 1/2 weeks.  Follow up in 1 month  [name], MD [facility] [phone] Pager [phone]

## 2018-01-02 DIAGNOSIS — T8789 Other complications of amputation stump: Secondary | ICD-10-CM | POA: Diagnosis not present

## 2018-01-02 DIAGNOSIS — E1151 Type 2 diabetes mellitus with diabetic peripheral angiopathy without gangrene: Secondary | ICD-10-CM | POA: Diagnosis not present

## 2018-01-02 DIAGNOSIS — I509 Heart failure, unspecified: Secondary | ICD-10-CM | POA: Diagnosis not present

## 2018-01-02 DIAGNOSIS — Z4800 Encounter for change or removal of nonsurgical wound dressing: Secondary | ICD-10-CM | POA: Diagnosis not present

## 2018-01-02 DIAGNOSIS — J449 Chronic obstructive pulmonary disease, unspecified: Secondary | ICD-10-CM | POA: Diagnosis not present

## 2018-01-02 DIAGNOSIS — Z89411 Acquired absence of right great toe: Secondary | ICD-10-CM | POA: Diagnosis not present

## 2018-01-02 DIAGNOSIS — I110 Hypertensive heart disease with heart failure: Secondary | ICD-10-CM | POA: Diagnosis not present

## 2018-01-02 DIAGNOSIS — Z89412 Acquired absence of left great toe: Secondary | ICD-10-CM | POA: Diagnosis not present

## 2018-01-02 DIAGNOSIS — I2510 Atherosclerotic heart disease of native coronary artery without angina pectoris: Secondary | ICD-10-CM | POA: Diagnosis not present

## 2018-01-03 DIAGNOSIS — I509 Heart failure, unspecified: Secondary | ICD-10-CM | POA: Diagnosis not present

## 2018-01-03 DIAGNOSIS — J449 Chronic obstructive pulmonary disease, unspecified: Secondary | ICD-10-CM | POA: Diagnosis not present

## 2018-01-03 DIAGNOSIS — Z89411 Acquired absence of right great toe: Secondary | ICD-10-CM | POA: Diagnosis not present

## 2018-01-03 DIAGNOSIS — I2510 Atherosclerotic heart disease of native coronary artery without angina pectoris: Secondary | ICD-10-CM | POA: Diagnosis not present

## 2018-01-03 DIAGNOSIS — E1151 Type 2 diabetes mellitus with diabetic peripheral angiopathy without gangrene: Secondary | ICD-10-CM | POA: Diagnosis not present

## 2018-01-03 DIAGNOSIS — I110 Hypertensive heart disease with heart failure: Secondary | ICD-10-CM | POA: Diagnosis not present

## 2018-01-03 DIAGNOSIS — Z4800 Encounter for change or removal of nonsurgical wound dressing: Secondary | ICD-10-CM | POA: Diagnosis not present

## 2018-01-03 DIAGNOSIS — Z89412 Acquired absence of left great toe: Secondary | ICD-10-CM | POA: Diagnosis not present

## 2018-01-03 DIAGNOSIS — T8789 Other complications of amputation stump: Secondary | ICD-10-CM | POA: Diagnosis not present

## 2018-01-03 NOTE — Progress Notes (Signed)
HEMATOLOGY/ONCOLOGY CONSULTATION NOTE  Date of Service: 01/04/2018  Patient Care Team: Medicine, Ledell Noss Internal as PCP - General (Internal Medicine) [name], Alphonse Guild, MD as PCP - Cardiology (Cardiology) Monico Blitz, MD as Referring Physician (Internal Medicine)  CHIEF COMPLAINTS/PURPOSE OF CONSULTATION:  Pancytopenia  HISTORY OF PRESENTING ILLNESS:   Mike Wright is a wonderful 84 y.o. male who has been referred to [place] by [facility]  for evaluation and management of Pancytopenia. H is accompanied today by his wife. The pt reports that he is doing well overall today.   The pt had an irrigation and debridement of the right great toe on 12/17/17 with Dr. Harold Barban. Prior to this, the pt began Bactrim on 11/13/17 through 12/01/17 under management of his podiatrist. The pt was then admitted between 12/02/17 and 12/05/17 for worsening SOB and CP, during which time labs showed that he had pancytopenia, which was new and had not been reflected on 11/21/17 labs. He also had both great toes amputated.   The pt reports that he his left foot is healing well but his right foot still has an open wound. He notes that he is back on 5mg  Eliquis BID and Plavix as of 12/23/17.  These were held while he was I the hospital for concerns of GI bleeding. He notes that his stools are dark but does not notice obvious blood in his stools. The patient's wife notes that he has been losing blood through his toes since May. The pt notes that he is now taking Doxycycline.   The pt notes that he had blood counts with his PCP this week, and that his HGB had improved to 11. The pt is currently taking Folic acid, Vitamin J62, and Vitamin D replacement. The pt notes that he did not tolerate PO Iron in the past, which gave him diarrhea. The pt has received at least two blood transfusions in the past year and is unsure if he has received IV iron replacement.   The pt notes that his upper abdomen intermittently hurts  and notes that he has gallstones. He manages this concern with diet changes which he notes have been successful.   Most recent lab results (12/16/17) of CBC is as follows: all values are WNL except for WBC at 3.5k, RBC at 3.70, HGB at 8.9, HCT at 28.5, MCV at 77.0, MCH at 24.1, RDW at 19.9.  On review of systems, pt reports stable energy levels, healing surgical sites on both feet, intermittent upper abdominal pain, and denies fevers, chills, unexpected weight loss, and any other symptoms.    MEDICAL HISTORY:  Past Medical History:  Diagnosis Date  . Anemia    a. mild, noted 04/2017.  [name] CAD in native artery    a. [name] 04/2017 s/p DES to D1, DES to prox-mid LAD, DES to prox LAD overlapping the prior stent, LVEF 55-65%.   . Chronic diastolic CHF (congestive heart failure) ([place])   . Diabetic ulcer of toe ([place])   . DJD (degenerative joint disease) of cervical spine   . Essential hypertension, benign   . GERD (gastroesophageal reflux disease)   . History of hiatal hernia   . HIT (heparin-induced thrombocytopenia) ([place])   . Hypothyroidism   . Hypoxia    a. went home on home O2 04/2017.  [name] Insomnia   . Mixed hyperlipidemia   . PAD (peripheral artery disease) ([place])   . PAF (paroxysmal atrial fibrillation) ([place])   . PVD (peripheral vascular disease) ([place])   .  Renal insufficiency   . 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  . Sinus bradycardia    a. HR 30s-40s in 04/2017 -> diltiazem stopped, metoprolol reduced.  . Sleep apnea    "chose not to order CPAP at this time" (05/18/2017)  . Type 2 diabetes mellitus ([place])   . Wheezing    a. suspected COPD 04/2017. Former tobacco x 40 years.    SURGICAL HISTORY: Past Surgical History:  Procedure Laterality Date  . ABDOMINAL AORTOGRAM W/LOWER EXTREMITY N/A 09/15/2017   Procedure: ABDOMINAL AORTOGRAM W/LOWER EXTREMITY;  Surgeon: Serafina Mitchell, MD;  Location: [place] [facility];  Service: Cardiovascular;  Laterality: N/A;  . AMPUTATION Bilateral  09/20/2017   Procedure: BILATERAL GREAT TOE AMPUTATIONS INCLUDING METATARSAL HEADS;  Surgeon: Serafina Mitchell, MD;  Location: [facility];  Service: Vascular;  Laterality: Bilateral;  . ANGIOPLASTY Right 09/20/2017   Procedure: ANGIOPLASTY RIGHT TIBIAL ARTERY;  Surgeon: Serafina Mitchell, MD;  Location: [place];  Service: Vascular;  Laterality: Right;  . APPENDECTOMY    . BACK SURGERY    . CARDIAC CATHETERIZATION  1980s; 2012;  . CATARACT EXTRACTION Left 2004  . COLONOSCOPY  2004   FLEISHMAN TICS  . CORONARY ANGIOPLASTY WITH STENT PLACEMENT  05/18/2017   "3 stents"  . CORONARY STENT INTERVENTION N/A 05/18/2017   Procedure: CORONARY STENT INTERVENTION;  Surgeon: Jettie Booze, MD;  Location: [place] [facility];  Service: Cardiovascular;  Laterality: N/A;  . ESOPHAGOGASTRODUODENOSCOPY (EGD) WITH PROPOFOL N/A 11/20/2017   Procedure: ESOPHAGOGASTRODUODENOSCOPY (EGD) WITH PROPOFOL;  Surgeon: Irene Shipper, MD;  Location: [place];  Service: Gastroenterology;  Laterality: N/A;  . I&D EXTREMITY Right 12/17/2017   Procedure: IRRIGATION AND DEBRIDEMENT RIGHT GREAT TOE;  Surgeon: Serafina Mitchell, MD;  Location: [facility];  Service: Vascular;  Laterality: Right;  . JOINT REPLACEMENT    . LEFT HEART CATH AND CORONARY ANGIOGRAPHY N/A 05/18/2017   Procedure: LEFT HEART CATH AND CORONARY ANGIOGRAPHY;  Surgeon: Jettie Booze, MD;  Location: [place] [facility];  Service: Cardiovascular;  Laterality: N/A;  . LOWER EXTREMITY ANGIOGRAM Right 09/20/2017   Procedure: RIGHT LOWER LEG  ANGIOGRAM;  Surgeon: Serafina Mitchell, MD;  Location: [place];  Service: Vascular;  Laterality: Right;  . LUMBAR FUSION  2002   L3, 4 L4, 5 L5 S1 Fused by Dr. Glenna Fellows  . PERIPHERAL VASCULAR BALLOON ANGIOPLASTY Left 09/15/2017   Procedure: PERIPHERAL VASCULAR BALLOON ANGIOPLASTY;  Surgeon: Serafina Mitchell, MD;  Location: [place] [facility];  Service: Cardiovascular;  Laterality: Left;  PTA of Peroneal & Posterior Tibial  . POSTERIOR  LUMBAR FUSION    . RHINOPLASTY    . RIGHT HEART CATH N/A 05/18/2017   Procedure: RIGHT HEART CATH;  Surgeon: Jettie Booze, MD;  Location: [place] [facility];  Service: Cardiovascular;  Laterality: N/A;  . TEE WITHOUT CARDIOVERSION N/A 09/18/2017   Procedure: TRANSESOPHAGEAL ECHOCARDIOGRAM (TEE);  Surgeon: Fay Records, MD;  Location: [place];  Service: Cardiovascular;  Laterality: N/A;  . TOTAL KNEE ARTHROPLASTY Bilateral   . TRANSURETHRAL RESECTION OF PROSTATE  2001   [place]    SOCIAL HISTORY: Social History   Socioeconomic History  . Marital status: Married    Spouse name: Not on file  . Number of children: Not on file  . Years of education: Not on file  . Highest education level: Not on file  Occupational History  . Not on file  Social Needs  . Financial  resource strain: Not on file  . Food insecurity:    Worry: Not on file    Inability: Not on file  . Transportation needs:    Medical: Not on file    Non-medical: Not on file  Tobacco Use  . Smoking status: Former Smoker    Packs/day: 2.00    Years: 30.00    Pack years: 60.00    Types: Cigarettes    Start date: 03/28/1953    Last attempt to quit: 03/29/1983    Years since quitting: 34.7  . Smokeless tobacco: Never Used  Substance and Sexual Activity  . Alcohol use: Not Currently    Comment: 05/18/2017 'I drink a beer q yr"  . Drug use: No  . Sexual activity: Not on file  Lifestyle  . Physical activity:    Days per week: Not on file    Minutes per session: Not on file  . Stress: Not on file  Relationships  . Social connections:    Talks on phone: Not on file    Gets together: Not on file    Attends religious service: Not on file    Active member of club or organization: Not on file    Attends meetings of clubs or organizations: Not on file    Relationship status: Not on file  . Intimate partner violence:    Fear of current or ex partner: Not on file    Emotionally abused: Not on file     Physically abused: Not on file    Forced sexual activity: Not on file  Other Topics Concern  . Not on file  Social History Narrative  . Not on file    FAMILY HISTORY: Family History  Problem Relation Age of Onset  . Heart attack Father 80  . COPD Father   . COPD Mother   . Heart disease Mother   . Diabetes Mother   . Hypertension Sister   . CVA Sister   . Diabetes Sister   . Multiple sclerosis Sister     ALLERGIES:  is allergic to bactrim [sulfamethoxazole-trimethoprim]; codeine; heparin; losartan; other; and oxycodone.  MEDICATIONS:  Current Outpatient Medications  Medication Sig Dispense Refill  . acetaminophen (TYLENOL) 500 MG tablet Take 500 mg by mouth as needed for mild pain or headache.    . albuterol (PROVENTIL) (2.5 MG/3ML) 0.083% nebulizer solution Inhale 3 mLs into the lungs every 4 (four) hours as needed for wheezing or shortness of breath. 75 mL 12  . amLODipine (NORVASC) 10 MG tablet Take 1 tablet (10 mg total) by mouth daily. 90 tablet 3  . apixaban (ELIQUIS) 5 MG TABS tablet Take 5 mg by mouth 2 (two) times daily.    [name] atorvastatin (LIPITOR) 80 MG tablet Take 1 tablet (80 mg total) by mouth every evening. 30 tablet 0  . Cholecalciferol (D3-1000 PO) Take 1,000 Units by mouth daily.     . clopidogrel (PLAVIX) 75 MG tablet Take 1 tablet (75 mg total) by mouth daily. 30 tablet 0  . collagenase (SANTYL) ointment Apply 1 application topically daily. 30 g 1  . Cyanocobalamin (B-12 PO) Take 1 tablet by mouth daily.     [name] doxycycline (VIBRA-TABS) 100 MG tablet Take 1 tablet (100 mg total) by mouth 2 (two) times daily. 60 tablet 0  . finasteride (PROSCAR) 5 MG tablet Take 5 mg by mouth daily.     . folic acid (FOLVITE) 1 MG tablet Take 1 tablet (1 mg total) by mouth daily. [place]  tablet 1  . furosemide (LASIX) 40 MG tablet Take 1 tablet (40 mg total) by mouth daily. 30 tablet 2  . insulin glargine (LANTUS) 100 UNIT/ML injection Inject 30-40 Units into the skin See admin  instructions. Inject 40 units SQ in the morning and inject 30 units SQ at bedtime    . meclizine (ANTIVERT) 12.5 MG tablet Take 1 tablet (12.5 mg total) by mouth 3 (three) times daily as needed for dizziness. 30 tablet 0  . metFORMIN (GLUCOPHAGE) 1000 MG tablet Take 1,000 mg by mouth 2 (two) times daily.    . metoprolol succinate (TOPROL-XL) 100 MG 24 hr tablet Take 1 tablet (100 mg total) by mouth daily. 30 tablet 1  . nitroGLYCERIN (NITROSTAT) 0.4 MG SL tablet PLACE 1 TABLET (0.4 MG TOTAL) UNDER THE TONGUE EVERY 5  MINUTES AS NEEDED FOR CHEST PAIN. (Patient taking differently: Place 0.4 mg under the tongue every 5 (five) minutes as needed. ) 25 tablet 3  . pantoprazole (PROTONIX) 40 MG tablet Take 1 tablet (40 mg total) by mouth daily. To protect stomach while taking multiple blood thinners. 30 tablet 2  . psyllium (METAMUCIL) 58.6 % packet Take 1 packet by mouth daily.    . traMADol (ULTRAM) 50 MG tablet Take 1 tablet (50 mg total) by mouth every 6 (six) hours as needed for moderate pain. 15 tablet 0  . traZODone (DESYREL) 100 MG tablet Take 100 mg by mouth at bedtime.      No current facility-administered medications for this visit.     REVIEW OF SYSTEMS:    10 Point review of Systems was done is negative except as noted above.  PHYSICAL EXAMINATION:  . Vitals:   01/04/18 1304  BP: (!) 128/52  Pulse: (!) 58  Resp: 14  Temp: 97.9 F (36.6 C)  SpO2: 98%   Filed Weights   01/04/18 1304  Weight: 203 lb 8 oz (92.3 kg)   .Body mass index is 31.87 kg/m.  GENERAL:alert, in no acute distress and comfortable SKIN: no acute rashes, no significant lesions EYES: conjunctiva are pink and non-injected, sclera anicteric OROPHARYNX: MMM, no exudates, no oropharyngeal erythema or ulceration NECK: supple, no JVD LYMPH:  no palpable lymphadenopathy in the cervical, axillary or inguinal regions LUNGS: clear to auscultation b/l with normal respiratory effort HEART: regular rate &  rhythm ABDOMEN:  normoactive bowel sounds , non tender, not distended. Extremity: no pedal edema PSYCH: alert & oriented x 3 with fluent speech NEURO: no focal motor/sensory deficits  LABORATORY DATA:  I have reviewed the data as listed  . CBC Latest Ref Rng & Units 01/04/2018 12/16/2017 12/13/2017  WBC 4.0 - 10.5 K/uL 6.8 3.5(L) 4.4  Hemoglobin 13.0 - 17.0 g/dL 11.5(L) 8.9(L) 10.5(L)  Hematocrit 39.0 - 52.0 % 36.7(L) 28.5(L) 33.7(L)  Platelets 150 - 400 K/uL 208 223 252    . CMP Latest Ref Rng & Units 01/04/2018 12/18/2017 12/16/2017  Glucose 70 - 99 mg/dL 113(H) 244(H) 203(H)  BUN 8 - 23 mg/dL 17 13 13   Creatinine 0.61 - 1.24 mg/dL 1.00 1.14 1.04  Sodium 135 - 145 mmol/L 141 134(L) 135  Potassium 3.5 - 5.1 mmol/L 4.6 3.8 4.0  Chloride 98 - 111 mmol/L 101 99 100  CO2 22 - 32 mmol/L 28 26 26   Calcium 8.9 - 10.3 mg/dL 9.9 8.1(L) 8.1(L)  Total Protein 6.5 - 8.1 g/dL 8.2(H) - -  Total Bilirubin 0.3 - 1.2 mg/dL 1.0 - -  Alkaline Phos 38 - 126 U/L 97 - -  AST 15 - 41 U/L 32 - -  ALT 0 - 44 U/L 22 - -     RADIOGRAPHIC STUDIES: I have personally reviewed the radiological images as listed and agreed with the findings in the report. No results found.  ASSESSMENT & PLAN:  84 y.o. male with  1. Anemia 2. Leukopenia 3. Thrombocytopenia PLAN: -Discussed patient's most recent available labs from 12/16/17, WBC at 3.5k, HGB at 8.9 with MCV at 77.0, PLT normal at 223k -Reviewed 12/05/17 CBC labs drawn during admission which revealed WBC at 1.7k, HGB at 10.5, and PLT at 77k -Recent foot infection, surgeries, and antibiotic use in the context of folic acid deficiency and Bactrim use most likely explain the patient's temporary pancytopenia which has since resolved -Continue Folic acid replacement and begin Vitamin B complex  -Discussed that as the pt is continuing to bleed from bilateral toe amputations, and he would need continued Iron replacement. -Recommend that pt be monitored for  concerns of bleeding as he is taking Plavix and Eliquis  -Will check labs today - reviewed as noted above. Pancytopenia resolved hgb upto 11.5, platelets have normalized at 208k and wbc count 6.8k. -Will see pt back as needed, pending labs today     Labs today RTC with Dr Irene Limbo as needed based on labs   All of the patients questions were answered with apparent satisfaction. The patient knows to call the clinic with any problems, questions or concerns.  The total time spent in the appt was 35 minutes and more than 50% was on counseling and direct patient cares.    Sullivan Lone MD MS AAHIVMS [facility] Hematology/Oncology Physician [facility]  (Office):       [phone] (Work cell):  [phone] (Fax):           [phone]  01/04/2018 1:50 PM  I, Baldwin [place], am acting as a scribe for Dr. Irene Limbo  .I have reviewed the above documentation for accuracy and completeness, and I agree with the above. Brunetta Genera MD

## 2018-01-04 ENCOUNTER — Telehealth: Payer: Self-pay | Admitting: Hematology

## 2018-01-04 ENCOUNTER — Inpatient Hospital Stay: Payer: Medicare HMO | Attending: Hematology | Admitting: Hematology

## 2018-01-04 ENCOUNTER — Inpatient Hospital Stay: Payer: Medicare HMO

## 2018-01-04 VITALS — BP 128/52 | HR 58 | Temp 97.9°F | Resp 14 | Ht 67.0 in | Wt 203.5 lb

## 2018-01-04 DIAGNOSIS — D649 Anemia, unspecified: Secondary | ICD-10-CM | POA: Diagnosis not present

## 2018-01-04 DIAGNOSIS — D72819 Decreased white blood cell count, unspecified: Secondary | ICD-10-CM | POA: Insufficient documentation

## 2018-01-04 DIAGNOSIS — I96 Gangrene, not elsewhere classified: Secondary | ICD-10-CM | POA: Diagnosis not present

## 2018-01-04 DIAGNOSIS — D61818 Other pancytopenia: Secondary | ICD-10-CM | POA: Diagnosis not present

## 2018-01-04 DIAGNOSIS — D509 Iron deficiency anemia, unspecified: Secondary | ICD-10-CM

## 2018-01-04 DIAGNOSIS — A4902 Methicillin resistant Staphylococcus aureus infection, unspecified site: Secondary | ICD-10-CM | POA: Diagnosis not present

## 2018-01-04 DIAGNOSIS — Z89411 Acquired absence of right great toe: Secondary | ICD-10-CM | POA: Diagnosis not present

## 2018-01-04 DIAGNOSIS — Z89412 Acquired absence of left great toe: Secondary | ICD-10-CM | POA: Diagnosis not present

## 2018-01-04 DIAGNOSIS — R7881 Bacteremia: Secondary | ICD-10-CM | POA: Diagnosis not present

## 2018-01-04 DIAGNOSIS — D696 Thrombocytopenia, unspecified: Secondary | ICD-10-CM | POA: Insufficient documentation

## 2018-01-04 DIAGNOSIS — I5032 Chronic diastolic (congestive) heart failure: Secondary | ICD-10-CM | POA: Diagnosis not present

## 2018-01-04 LAB — CBC WITH DIFFERENTIAL/PLATELET
Abs Immature Granulocytes: 0.02 10*3/uL (ref 0.00–0.07)
BASOS ABS: 0.1 10*3/uL (ref 0.0–0.1)
BASOS PCT: 1 %
EOS ABS: 0.1 10*3/uL (ref 0.0–0.5)
EOS PCT: 2 %
HCT: 36.7 % — ABNORMAL LOW (ref 39.0–52.0)
Hemoglobin: 11.5 g/dL — ABNORMAL LOW (ref 13.0–17.0)
Immature Granulocytes: 0 %
LYMPHS PCT: 30 %
Lymphs Abs: 2 10*3/uL (ref 0.7–4.0)
MCH: 24.6 pg — ABNORMAL LOW (ref 26.0–34.0)
MCHC: 31.3 g/dL (ref 30.0–36.0)
MCV: 78.6 fL — ABNORMAL LOW (ref 80.0–100.0)
Monocytes Absolute: 0.9 10*3/uL (ref 0.1–1.0)
Monocytes Relative: 13 %
NRBC: 0 % (ref 0.0–0.2)
Neutro Abs: 3.7 10*3/uL (ref 1.7–7.7)
Neutrophils Relative %: 54 %
PLATELETS: 208 10*3/uL (ref 150–400)
RBC: 4.67 MIL/uL (ref 4.22–5.81)
RDW: 22.8 % — ABNORMAL HIGH (ref 11.5–15.5)
WBC: 6.8 10*3/uL (ref 4.0–10.5)

## 2018-01-04 LAB — CMP (CANCER CENTER ONLY)
ALT: 22 U/L (ref 0–44)
AST: 32 U/L (ref 15–41)
Albumin: 3.9 g/dL (ref 3.5–5.0)
Alkaline Phosphatase: 97 U/L (ref 38–126)
Anion gap: 12 (ref 5–15)
BILIRUBIN TOTAL: 1 mg/dL (ref 0.3–1.2)
BUN: 17 mg/dL (ref 8–23)
CALCIUM: 9.9 mg/dL (ref 8.9–10.3)
CHLORIDE: 101 mmol/L (ref 98–111)
CO2: 28 mmol/L (ref 22–32)
Creatinine: 1 mg/dL (ref 0.61–1.24)
Glucose, Bld: 113 mg/dL — ABNORMAL HIGH (ref 70–99)
Potassium: 4.6 mmol/L (ref 3.5–5.1)
Sodium: 141 mmol/L (ref 135–145)
TOTAL PROTEIN: 8.2 g/dL — AB (ref 6.5–8.1)

## 2018-01-04 LAB — RETICULOCYTES
Immature Retic Fract: 6 % (ref 2.3–15.9)
RBC.: 4.67 MIL/uL (ref 4.22–5.81)
Retic Count, Absolute: 49 10*3/uL (ref 19.0–186.0)
Retic Ct Pct: 1.1 % (ref 0.4–3.1)

## 2018-01-04 LAB — SAMPLE TO BLOOD BANK

## 2018-01-04 LAB — IRON AND TIBC
IRON: 46 ug/dL (ref 42–163)
Saturation Ratios: 17 % — ABNORMAL LOW (ref 42–163)
TIBC: 268 ug/dL (ref 202–409)
UIBC: 221 ug/dL

## 2018-01-04 LAB — FERRITIN: FERRITIN: 52 ng/mL (ref 24–336)

## 2018-01-04 LAB — LACTATE DEHYDROGENASE: LDH: 177 U/L (ref 98–192)

## 2018-01-04 NOTE — Telephone Encounter (Signed)
Appts scheduled avs printed/ RTC with Dr Irene Limbo as needed based on labs per 10/10 los

## 2018-01-05 DIAGNOSIS — I2510 Atherosclerotic heart disease of native coronary artery without angina pectoris: Secondary | ICD-10-CM | POA: Diagnosis not present

## 2018-01-05 DIAGNOSIS — T8789 Other complications of amputation stump: Secondary | ICD-10-CM | POA: Diagnosis not present

## 2018-01-05 DIAGNOSIS — I509 Heart failure, unspecified: Secondary | ICD-10-CM | POA: Diagnosis not present

## 2018-01-05 DIAGNOSIS — Z6830 Body mass index (BMI) 30.0-30.9, adult: Secondary | ICD-10-CM | POA: Diagnosis not present

## 2018-01-05 DIAGNOSIS — Z299 Encounter for prophylactic measures, unspecified: Secondary | ICD-10-CM | POA: Diagnosis not present

## 2018-01-05 DIAGNOSIS — Z89411 Acquired absence of right great toe: Secondary | ICD-10-CM | POA: Diagnosis not present

## 2018-01-05 DIAGNOSIS — I110 Hypertensive heart disease with heart failure: Secondary | ICD-10-CM | POA: Diagnosis not present

## 2018-01-05 DIAGNOSIS — Z89412 Acquired absence of left great toe: Secondary | ICD-10-CM | POA: Diagnosis not present

## 2018-01-05 DIAGNOSIS — Z4800 Encounter for change or removal of nonsurgical wound dressing: Secondary | ICD-10-CM | POA: Diagnosis not present

## 2018-01-05 DIAGNOSIS — I10 Essential (primary) hypertension: Secondary | ICD-10-CM | POA: Diagnosis not present

## 2018-01-05 DIAGNOSIS — D649 Anemia, unspecified: Secondary | ICD-10-CM | POA: Diagnosis not present

## 2018-01-05 DIAGNOSIS — J449 Chronic obstructive pulmonary disease, unspecified: Secondary | ICD-10-CM | POA: Diagnosis not present

## 2018-01-05 DIAGNOSIS — E1151 Type 2 diabetes mellitus with diabetic peripheral angiopathy without gangrene: Secondary | ICD-10-CM | POA: Diagnosis not present

## 2018-01-05 DIAGNOSIS — E1169 Type 2 diabetes mellitus with other specified complication: Secondary | ICD-10-CM | POA: Diagnosis not present

## 2018-01-05 DIAGNOSIS — I739 Peripheral vascular disease, unspecified: Secondary | ICD-10-CM | POA: Diagnosis not present

## 2018-01-05 DIAGNOSIS — Z89619 Acquired absence of unspecified leg above knee: Secondary | ICD-10-CM | POA: Diagnosis not present

## 2018-01-05 LAB — HAPTOGLOBIN: HAPTOGLOBIN: 122 mg/dL (ref 34–200)

## 2018-01-08 DIAGNOSIS — Z4800 Encounter for change or removal of nonsurgical wound dressing: Secondary | ICD-10-CM | POA: Diagnosis not present

## 2018-01-08 DIAGNOSIS — T8789 Other complications of amputation stump: Secondary | ICD-10-CM | POA: Diagnosis not present

## 2018-01-08 DIAGNOSIS — I2510 Atherosclerotic heart disease of native coronary artery without angina pectoris: Secondary | ICD-10-CM | POA: Diagnosis not present

## 2018-01-08 DIAGNOSIS — Z89411 Acquired absence of right great toe: Secondary | ICD-10-CM | POA: Diagnosis not present

## 2018-01-08 DIAGNOSIS — I110 Hypertensive heart disease with heart failure: Secondary | ICD-10-CM | POA: Diagnosis not present

## 2018-01-08 DIAGNOSIS — J449 Chronic obstructive pulmonary disease, unspecified: Secondary | ICD-10-CM | POA: Diagnosis not present

## 2018-01-08 DIAGNOSIS — I509 Heart failure, unspecified: Secondary | ICD-10-CM | POA: Diagnosis not present

## 2018-01-08 DIAGNOSIS — E1151 Type 2 diabetes mellitus with diabetic peripheral angiopathy without gangrene: Secondary | ICD-10-CM | POA: Diagnosis not present

## 2018-01-08 DIAGNOSIS — Z89412 Acquired absence of left great toe: Secondary | ICD-10-CM | POA: Diagnosis not present

## 2018-01-09 DIAGNOSIS — I509 Heart failure, unspecified: Secondary | ICD-10-CM | POA: Diagnosis not present

## 2018-01-09 DIAGNOSIS — T8789 Other complications of amputation stump: Secondary | ICD-10-CM | POA: Diagnosis not present

## 2018-01-09 DIAGNOSIS — I2510 Atherosclerotic heart disease of native coronary artery without angina pectoris: Secondary | ICD-10-CM | POA: Diagnosis not present

## 2018-01-09 DIAGNOSIS — Z89412 Acquired absence of left great toe: Secondary | ICD-10-CM | POA: Diagnosis not present

## 2018-01-09 DIAGNOSIS — Z4800 Encounter for change or removal of nonsurgical wound dressing: Secondary | ICD-10-CM | POA: Diagnosis not present

## 2018-01-09 DIAGNOSIS — Z89411 Acquired absence of right great toe: Secondary | ICD-10-CM | POA: Diagnosis not present

## 2018-01-09 DIAGNOSIS — I110 Hypertensive heart disease with heart failure: Secondary | ICD-10-CM | POA: Diagnosis not present

## 2018-01-09 DIAGNOSIS — E1151 Type 2 diabetes mellitus with diabetic peripheral angiopathy without gangrene: Secondary | ICD-10-CM | POA: Diagnosis not present

## 2018-01-09 DIAGNOSIS — J449 Chronic obstructive pulmonary disease, unspecified: Secondary | ICD-10-CM | POA: Diagnosis not present

## 2018-01-12 DIAGNOSIS — I110 Hypertensive heart disease with heart failure: Secondary | ICD-10-CM | POA: Diagnosis not present

## 2018-01-12 DIAGNOSIS — I2510 Atherosclerotic heart disease of native coronary artery without angina pectoris: Secondary | ICD-10-CM | POA: Diagnosis not present

## 2018-01-12 DIAGNOSIS — Z89412 Acquired absence of left great toe: Secondary | ICD-10-CM | POA: Diagnosis not present

## 2018-01-12 DIAGNOSIS — Z89411 Acquired absence of right great toe: Secondary | ICD-10-CM | POA: Diagnosis not present

## 2018-01-12 DIAGNOSIS — Z4800 Encounter for change or removal of nonsurgical wound dressing: Secondary | ICD-10-CM | POA: Diagnosis not present

## 2018-01-12 DIAGNOSIS — J449 Chronic obstructive pulmonary disease, unspecified: Secondary | ICD-10-CM | POA: Diagnosis not present

## 2018-01-12 DIAGNOSIS — T8789 Other complications of amputation stump: Secondary | ICD-10-CM | POA: Diagnosis not present

## 2018-01-12 DIAGNOSIS — E1151 Type 2 diabetes mellitus with diabetic peripheral angiopathy without gangrene: Secondary | ICD-10-CM | POA: Diagnosis not present

## 2018-01-12 DIAGNOSIS — I509 Heart failure, unspecified: Secondary | ICD-10-CM | POA: Diagnosis not present

## 2018-01-19 DIAGNOSIS — J449 Chronic obstructive pulmonary disease, unspecified: Secondary | ICD-10-CM | POA: Diagnosis not present

## 2018-01-19 DIAGNOSIS — E1151 Type 2 diabetes mellitus with diabetic peripheral angiopathy without gangrene: Secondary | ICD-10-CM | POA: Diagnosis not present

## 2018-01-19 DIAGNOSIS — T8789 Other complications of amputation stump: Secondary | ICD-10-CM | POA: Diagnosis not present

## 2018-01-19 DIAGNOSIS — I110 Hypertensive heart disease with heart failure: Secondary | ICD-10-CM | POA: Diagnosis not present

## 2018-01-19 DIAGNOSIS — I509 Heart failure, unspecified: Secondary | ICD-10-CM | POA: Diagnosis not present

## 2018-01-19 DIAGNOSIS — Z89411 Acquired absence of right great toe: Secondary | ICD-10-CM | POA: Diagnosis not present

## 2018-01-19 DIAGNOSIS — Z4800 Encounter for change or removal of nonsurgical wound dressing: Secondary | ICD-10-CM | POA: Diagnosis not present

## 2018-01-19 DIAGNOSIS — Z89412 Acquired absence of left great toe: Secondary | ICD-10-CM | POA: Diagnosis not present

## 2018-01-19 DIAGNOSIS — I2510 Atherosclerotic heart disease of native coronary artery without angina pectoris: Secondary | ICD-10-CM | POA: Diagnosis not present

## 2018-01-22 ENCOUNTER — Other Ambulatory Visit: Payer: Self-pay | Admitting: Cardiology

## 2018-01-22 MED ORDER — APIXABAN 5 MG PO TABS: 5.0000 mg | ORAL_TABLET | Freq: Two times a day (BID) | ORAL | 3 refills | Status: DC

## 2018-01-22 NOTE — Telephone Encounter (Signed)
eliquis refill to [facility] done

## 2018-01-26 DIAGNOSIS — I2510 Atherosclerotic heart disease of native coronary artery without angina pectoris: Secondary | ICD-10-CM | POA: Diagnosis not present

## 2018-01-26 DIAGNOSIS — Z89412 Acquired absence of left great toe: Secondary | ICD-10-CM | POA: Diagnosis not present

## 2018-01-26 DIAGNOSIS — Z89411 Acquired absence of right great toe: Secondary | ICD-10-CM | POA: Diagnosis not present

## 2018-01-26 DIAGNOSIS — J449 Chronic obstructive pulmonary disease, unspecified: Secondary | ICD-10-CM | POA: Diagnosis not present

## 2018-01-26 DIAGNOSIS — I509 Heart failure, unspecified: Secondary | ICD-10-CM | POA: Diagnosis not present

## 2018-01-26 DIAGNOSIS — Z4800 Encounter for change or removal of nonsurgical wound dressing: Secondary | ICD-10-CM | POA: Diagnosis not present

## 2018-01-26 DIAGNOSIS — I110 Hypertensive heart disease with heart failure: Secondary | ICD-10-CM | POA: Diagnosis not present

## 2018-01-26 DIAGNOSIS — T8789 Other complications of amputation stump: Secondary | ICD-10-CM | POA: Diagnosis not present

## 2018-01-26 DIAGNOSIS — E1151 Type 2 diabetes mellitus with diabetic peripheral angiopathy without gangrene: Secondary | ICD-10-CM | POA: Diagnosis not present

## 2018-02-01 DIAGNOSIS — I2510 Atherosclerotic heart disease of native coronary artery without angina pectoris: Secondary | ICD-10-CM | POA: Diagnosis not present

## 2018-02-01 DIAGNOSIS — J449 Chronic obstructive pulmonary disease, unspecified: Secondary | ICD-10-CM | POA: Diagnosis not present

## 2018-02-01 DIAGNOSIS — T8789 Other complications of amputation stump: Secondary | ICD-10-CM | POA: Diagnosis not present

## 2018-02-01 DIAGNOSIS — Z89411 Acquired absence of right great toe: Secondary | ICD-10-CM | POA: Diagnosis not present

## 2018-02-01 DIAGNOSIS — I509 Heart failure, unspecified: Secondary | ICD-10-CM | POA: Diagnosis not present

## 2018-02-01 DIAGNOSIS — I110 Hypertensive heart disease with heart failure: Secondary | ICD-10-CM | POA: Diagnosis not present

## 2018-02-01 DIAGNOSIS — Z89412 Acquired absence of left great toe: Secondary | ICD-10-CM | POA: Diagnosis not present

## 2018-02-01 DIAGNOSIS — E1151 Type 2 diabetes mellitus with diabetic peripheral angiopathy without gangrene: Secondary | ICD-10-CM | POA: Diagnosis not present

## 2018-02-01 DIAGNOSIS — Z4800 Encounter for change or removal of nonsurgical wound dressing: Secondary | ICD-10-CM | POA: Diagnosis not present

## 2018-02-04 DIAGNOSIS — Z89412 Acquired absence of left great toe: Secondary | ICD-10-CM | POA: Diagnosis not present

## 2018-02-04 DIAGNOSIS — A4902 Methicillin resistant Staphylococcus aureus infection, unspecified site: Secondary | ICD-10-CM | POA: Diagnosis not present

## 2018-02-04 DIAGNOSIS — R7881 Bacteremia: Secondary | ICD-10-CM | POA: Diagnosis not present

## 2018-02-04 DIAGNOSIS — I5032 Chronic diastolic (congestive) heart failure: Secondary | ICD-10-CM | POA: Diagnosis not present

## 2018-02-04 DIAGNOSIS — I96 Gangrene, not elsewhere classified: Secondary | ICD-10-CM | POA: Diagnosis not present

## 2018-02-04 DIAGNOSIS — Z89411 Acquired absence of right great toe: Secondary | ICD-10-CM | POA: Diagnosis not present

## 2018-02-05 ENCOUNTER — Encounter: Payer: Self-pay | Admitting: Surgery

## 2018-02-05 ENCOUNTER — Ambulatory Visit (INDEPENDENT_AMBULATORY_CARE_PROVIDER_SITE_OTHER): Payer: Self-pay | Admitting: Surgery

## 2018-02-05 VITALS — BP 144/76 | HR 66 | Temp 97.4°F | Resp 18 | Ht 67.0 in | Wt 212.0 lb

## 2018-02-05 DIAGNOSIS — Z9889 Other specified postprocedural states: Secondary | ICD-10-CM

## 2018-02-05 DIAGNOSIS — T8189XD Other complications of procedures, not elsewhere classified, subsequent encounter: Secondary | ICD-10-CM

## 2018-02-05 NOTE — Progress Notes (Signed)
[facility] [place]  Patient name: Mike Wright MRN: [id number] DOB: 18-Apr-1933 Sex: male   REASON FOR VISIT:    Follow up  HISOTRY OF PRESENT ILLNESS:    EURA Wright is a 84 y.o. male who is status post balloon angioplasty of the left peroneal and posterior tibial artery as well as the right posterior tibial artery in June 2019.  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  He has been getting wound care.  When he presented to the office in September, he had cellulitis on his foot and was admitted for IV antibiotics.    On 12/17/2017, I took him to the operating room for I&D of his right great toe including resection of additional bone.  Cellulitis cleaned up.  He is back today for follow-up.   PAST MEDICAL HISTORY:   Past Medical History:  Diagnosis Date  . Anemia    a. mild, noted 04/2017.  [name] CAD in native artery    a. [name] 04/2017 s/p DES to D1, DES to prox-mid LAD, DES to prox LAD overlapping the prior stent, LVEF 55-65%.   . Chronic diastolic CHF (congestive heart failure) ([place])   . Diabetic ulcer of toe ([place])   . DJD (degenerative joint disease) of cervical spine   . Essential hypertension, benign   . GERD (gastroesophageal reflux disease)   . History of hiatal hernia   . HIT (heparin-induced thrombocytopenia) ([place])   . Hypothyroidism   . Hypoxia    a. went home on home O2 04/2017.  [name] Insomnia   . Mixed hyperlipidemia   . PAD (peripheral artery disease) ([place])   . PAF (paroxysmal atrial fibrillation) ([place])   . PVD (peripheral vascular disease) ([place])   . Renal insufficiency   . 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  . Sinus bradycardia    a. HR 30s-40s in 04/2017 -> diltiazem stopped, metoprolol reduced.  . Sleep apnea    "chose not to order CPAP at this time" (05/18/2017)  . Type 2 diabetes mellitus ([place])   . Wheezing    a. suspected COPD 04/2017. Former tobacco x 40 years.     FAMILY  HISTORY:   Family History  Problem Relation Age of Onset  . Heart attack Father 1  . COPD Father   . COPD Mother   . Heart disease Mother   . Diabetes Mother   . Hypertension Sister   . CVA Sister   . Diabetes Sister   . Multiple sclerosis Sister     SOCIAL HISTORY:   Social History   Tobacco Use  . Smoking status: Former Smoker    Packs/day: 2.00    Years: 30.00    Pack years: 60.00    Types: Cigarettes    Start date: 03/28/1953    Last attempt to quit: 03/29/1983    Years since quitting: 34.8  . Smokeless tobacco: Never Used  Substance Use Topics  . Alcohol use: Not Currently    Comment: 05/18/2017 'I drink a beer q yr"     ALLERGIES:   Allergies  Allergen Reactions  . Bactrim [Sulfamethoxazole-Trimethoprim]     Pancytopenia  . Codeine Shortness Of Breath  . Heparin Other (See Comments)    +HIT,  Severe bleeding (with heparin drip & large doses)  . Losartan Swelling    Per ENT UNSPECIFIED REACTION   . Other Other (See Comments)    Severe bleeding UNSPECIFIED AGENT   .  Oxycodone Other (See Comments)    "Made me act out of my mind" Other reaction(s): Other (See Comments) Mental status changes hallucinations     CURRENT MEDICATIONS:   Current Outpatient Medications  Medication Sig Dispense Refill  . acetaminophen (TYLENOL) 500 MG tablet Take 500 mg by mouth as needed for mild pain or headache.    . albuterol (PROVENTIL) (2.5 MG/3ML) 0.083% nebulizer solution Inhale 3 mLs into the lungs every 4 (four) hours as needed for wheezing or shortness of breath. 75 mL 12  . amLODipine (NORVASC) 10 MG tablet Take 1 tablet (10 mg total) by mouth daily. 90 tablet 3  . apixaban (ELIQUIS) 5 MG TABS tablet Take 1 tablet (5 mg total) by mouth 2 (two) times daily. 180 tablet 3  . atorvastatin (LIPITOR) 80 MG tablet Take 1 tablet (80 mg total) by mouth every evening. 30 tablet 0  . Cholecalciferol (D3-1000 PO) Take 1,000 Units by mouth daily.     . clopidogrel (PLAVIX) 75  MG tablet Take 1 tablet (75 mg total) by mouth daily. 30 tablet 0  . collagenase (SANTYL) ointment Apply 1 application topically daily. 30 g 1  . Cyanocobalamin (B-12 PO) Take 1 tablet by mouth daily.     . DROPLET INSULIN SYRINGE 30G X 1/2" 1 ML MISC     . finasteride (PROSCAR) 5 MG tablet Take 5 mg by mouth daily.     . folic acid (FOLVITE) 1 MG tablet Take 1 tablet (1 mg total) by mouth daily. 30 tablet 1  . furosemide (LASIX) 40 MG tablet Take 1 tablet (40 mg total) by mouth daily. 30 tablet 2  . insulin glargine (LANTUS) 100 UNIT/ML injection Inject 30-40 Units into the skin See admin instructions. Inject 40 units SQ in the morning and inject 30 units SQ at bedtime    . lisinopril (PRINIVIL,ZESTRIL) 5 MG tablet     . meclizine (ANTIVERT) 12.5 MG tablet Take 1 tablet (12.5 mg total) by mouth 3 (three) times daily as needed for dizziness. 30 tablet 0  . metFORMIN (GLUCOPHAGE) 1000 MG tablet Take 1,000 mg by mouth 2 (two) times daily.    . metoprolol succinate (TOPROL-XL) 100 MG 24 hr tablet Take 1 tablet (100 mg total) by mouth daily. 30 tablet 1  . nitroGLYCERIN (NITROSTAT) 0.4 MG SL tablet PLACE 1 TABLET (0.4 MG TOTAL) UNDER THE TONGUE EVERY 5  MINUTES AS NEEDED FOR CHEST PAIN. (Patient taking differently: Place 0.4 mg under the tongue every 5 (five) minutes as needed. ) 25 tablet 3  . pantoprazole (PROTONIX) 40 MG tablet Take 1 tablet (40 mg total) by mouth daily. To protect stomach while taking multiple blood thinners. 30 tablet 2  . psyllium (METAMUCIL) 58.6 % packet Take 1 packet by mouth daily.    . traZODone (DESYREL) 100 MG tablet Take 100 mg by mouth at bedtime.     [name] doxycycline (VIBRA-TABS) 100 MG tablet Take 1 tablet (100 mg total) by mouth 2 (two) times daily. (Patient not taking: Reported on 02/05/2018) 60 tablet 0  . metoprolol tartrate (LOPRESSOR) 100 MG tablet     . traMADol (ULTRAM) 50 MG tablet Take 1 tablet (50 mg total) by mouth every 6 (six) hours as needed for moderate  pain. (Patient not taking: Reported on 02/05/2018) 15 tablet 0   No current facility-administered medications for this visit.     REVIEW OF SYSTEMS:   [X]  denotes positive finding, [ ]  denotes negative finding Cardiac  Comments:  Chest  pain or chest pressure:    Shortness of breath upon exertion:    Short of breath when lying flat:    Irregular heart rhythm:        Vascular    Pain in calf, thigh, or hip brought on by ambulation:    Pain in feet at night that wakes you up from your sleep:     Blood clot in your veins:    Leg swelling:         Pulmonary    Oxygen at home:    Productive cough:     Wheezing:         Neurologic    Sudden weakness in arms or legs:     Sudden numbness in arms or legs:     Sudden onset of difficulty speaking or slurred speech:    Temporary loss of vision in one eye:     Problems with dizziness:         Gastrointestinal    Blood in stool:     Vomited blood:         Genitourinary    Burning when urinating:     Blood in urine:        Psychiatric    Major depression:         Hematologic    Bleeding problems:    Problems with blood clotting too easily:        Skin    Rashes or ulcers: x       Constitutional    Fever or chills:      PHYSICAL EXAM:   Vitals:   02/05/18 1115  BP: (!) 144/76  Pulse: 66  Resp: 18  Temp: (!) 97.4 F (36.3 C)  TempSrc: Oral  SpO2: 97%  Weight: 212 lb (96.2 kg)  Height: 5\' 7"  (1.702 m)    GENERAL: The patient is a well-nourished male, in no acute distress. The vital signs are documented above. CARDIAC: There is a regular rate and rhythm.  VASCULAR: non palpable pedal pulses PULMONARY: Non-labored respirations  MUSCULOSKELETAL: There are no major deformities or cyanosis. NEUROLOGIC: No focal weakness or paresthesias are detected. SKIN: significant improvement in ulcers / wounds PSYCHIATRIC: The patient has a normal affect.      STUDIES:   none  MEDICAL ISSUES:    Wounds have nearly  healed.  F/u 3 months with abd / duplex.  Patient knows to call if there is any change in his wounds  [name], MD [facility] [phone] Pager [phone]

## 2018-02-08 DIAGNOSIS — Z89412 Acquired absence of left great toe: Secondary | ICD-10-CM | POA: Diagnosis not present

## 2018-02-08 DIAGNOSIS — Z89411 Acquired absence of right great toe: Secondary | ICD-10-CM | POA: Diagnosis not present

## 2018-02-08 DIAGNOSIS — E1142 Type 2 diabetes mellitus with diabetic polyneuropathy: Secondary | ICD-10-CM | POA: Diagnosis not present

## 2018-02-08 DIAGNOSIS — Z794 Long term (current) use of insulin: Secondary | ICD-10-CM | POA: Diagnosis not present

## 2018-02-08 DIAGNOSIS — Z6832 Body mass index (BMI) 32.0-32.9, adult: Secondary | ICD-10-CM | POA: Diagnosis not present

## 2018-02-08 DIAGNOSIS — T465X5D Adverse effect of other antihypertensive drugs, subsequent encounter: Secondary | ICD-10-CM | POA: Diagnosis not present

## 2018-02-08 DIAGNOSIS — E1165 Type 2 diabetes mellitus with hyperglycemia: Secondary | ICD-10-CM | POA: Diagnosis not present

## 2018-02-08 DIAGNOSIS — E1151 Type 2 diabetes mellitus with diabetic peripheral angiopathy without gangrene: Secondary | ICD-10-CM | POA: Diagnosis not present

## 2018-02-09 DIAGNOSIS — Z89411 Acquired absence of right great toe: Secondary | ICD-10-CM | POA: Diagnosis not present

## 2018-02-09 DIAGNOSIS — J449 Chronic obstructive pulmonary disease, unspecified: Secondary | ICD-10-CM | POA: Diagnosis not present

## 2018-02-09 DIAGNOSIS — T8789 Other complications of amputation stump: Secondary | ICD-10-CM | POA: Diagnosis not present

## 2018-02-09 DIAGNOSIS — Z89412 Acquired absence of left great toe: Secondary | ICD-10-CM | POA: Diagnosis not present

## 2018-02-09 DIAGNOSIS — I2510 Atherosclerotic heart disease of native coronary artery without angina pectoris: Secondary | ICD-10-CM | POA: Diagnosis not present

## 2018-02-09 DIAGNOSIS — Z4800 Encounter for change or removal of nonsurgical wound dressing: Secondary | ICD-10-CM | POA: Diagnosis not present

## 2018-02-09 DIAGNOSIS — I110 Hypertensive heart disease with heart failure: Secondary | ICD-10-CM | POA: Diagnosis not present

## 2018-02-09 DIAGNOSIS — E1151 Type 2 diabetes mellitus with diabetic peripheral angiopathy without gangrene: Secondary | ICD-10-CM | POA: Diagnosis not present

## 2018-02-09 DIAGNOSIS — I509 Heart failure, unspecified: Secondary | ICD-10-CM | POA: Diagnosis not present

## 2018-03-06 DIAGNOSIS — I5032 Chronic diastolic (congestive) heart failure: Secondary | ICD-10-CM | POA: Diagnosis not present

## 2018-03-06 DIAGNOSIS — A4902 Methicillin resistant Staphylococcus aureus infection, unspecified site: Secondary | ICD-10-CM | POA: Diagnosis not present

## 2018-03-06 DIAGNOSIS — R7881 Bacteremia: Secondary | ICD-10-CM | POA: Diagnosis not present

## 2018-03-06 DIAGNOSIS — I96 Gangrene, not elsewhere classified: Secondary | ICD-10-CM | POA: Diagnosis not present

## 2018-03-06 DIAGNOSIS — Z89411 Acquired absence of right great toe: Secondary | ICD-10-CM | POA: Diagnosis not present

## 2018-03-06 DIAGNOSIS — Z89412 Acquired absence of left great toe: Secondary | ICD-10-CM | POA: Diagnosis not present

## 2018-03-13 DIAGNOSIS — Z299 Encounter for prophylactic measures, unspecified: Secondary | ICD-10-CM | POA: Diagnosis not present

## 2018-03-13 DIAGNOSIS — I10 Essential (primary) hypertension: Secondary | ICD-10-CM | POA: Diagnosis not present

## 2018-03-13 DIAGNOSIS — E1165 Type 2 diabetes mellitus with hyperglycemia: Secondary | ICD-10-CM | POA: Diagnosis not present

## 2018-03-13 DIAGNOSIS — I509 Heart failure, unspecified: Secondary | ICD-10-CM | POA: Diagnosis not present

## 2018-03-13 DIAGNOSIS — K469 Unspecified abdominal hernia without obstruction or gangrene: Secondary | ICD-10-CM | POA: Diagnosis not present

## 2018-03-13 DIAGNOSIS — Z6832 Body mass index (BMI) 32.0-32.9, adult: Secondary | ICD-10-CM | POA: Diagnosis not present

## 2018-03-13 DIAGNOSIS — N471 Phimosis: Secondary | ICD-10-CM | POA: Diagnosis not present

## 2018-03-16 ENCOUNTER — Other Ambulatory Visit: Payer: Self-pay | Admitting: Cardiology

## 2018-03-30 ENCOUNTER — Other Ambulatory Visit: Payer: Self-pay | Admitting: *Deleted

## 2018-03-30 DIAGNOSIS — I739 Peripheral vascular disease, unspecified: Secondary | ICD-10-CM

## 2018-04-04 ENCOUNTER — Encounter: Payer: Self-pay | Admitting: Cardiology

## 2018-04-04 ENCOUNTER — Encounter: Payer: Self-pay | Admitting: *Deleted

## 2018-04-04 ENCOUNTER — Ambulatory Visit: Payer: Medicare HMO | Admitting: Cardiology

## 2018-04-04 VITALS — BP 116/57 | HR 62 | Ht 67.0 in | Wt 209.8 lb

## 2018-04-04 DIAGNOSIS — I5032 Chronic diastolic (congestive) heart failure: Secondary | ICD-10-CM

## 2018-04-04 DIAGNOSIS — I10 Essential (primary) hypertension: Secondary | ICD-10-CM | POA: Diagnosis not present

## 2018-04-04 DIAGNOSIS — I2510 Atherosclerotic heart disease of native coronary artery without angina pectoris: Secondary | ICD-10-CM

## 2018-04-04 DIAGNOSIS — I4891 Unspecified atrial fibrillation: Secondary | ICD-10-CM | POA: Diagnosis not present

## 2018-04-04 MED ORDER — ISOSORBIDE MONONITRATE ER 30 MG PO TB24: 15.0000 mg | ORAL_TABLET | Freq: Every day | ORAL | 3 refills | Status: DC

## 2018-04-04 NOTE — Patient Instructions (Addendum)
Medication Instructions:   Your physician has recommended you make the following change in your medication:   Start isosorbide mononitrate (imdur) 15 mg by mouth daily  Continue all other medications the same  Labwork:  NONE  Testing/Procedures:  NONE  Follow-Up:  Your physician recommends that you schedule a follow-up appointment in: 2 months.  Any Other Special Instructions Will Be Listed Below (If Applicable).  If you need a refill on your cardiac medications before your next appointment, please call your pharmacy.

## 2018-04-04 NOTE — Progress Notes (Signed)
Clinical Summary Mr. Balis is a 85 y.o.male  seen today for follow up of the following medical problems.   1. CAD - nonobstructive CAD by cath Jan 2012, LVEF 60-65% by LV gram.  - 06/2015 nuclear stress without clear ischemia - 05/2015 echo LVEF 65-70%, no WMAs, cannot evaluate diastolic function 06/9700 lexiscan without ischemia, low risk study.    - last visit due to progressing chest and SOB/DOE he was referred for [facility] -cath 04/2017 as reported below. Received DES to 75% D1, DES to 75% mid to distal LAD, DES to 70% prox LAD RHC with CI 2.67, mean PA 25, no wedge reported by LVEDP 14.  - discharged on triple therapy with ASA, plavix, eliquis with plan for 30 days, then stop ASA.   - 11/22/17 admission with chest pain, anemia. Negative workup for ACS. Symptoms resolved with blood transfusion - 11/2017 admit with chest pain and dyspnea. ACS work negative, CT PE negative. Diuresed 6L with resolustion of symptoms. CXR with rib fracture thought playing a role in chest pain.     - occasional chest pain at times, better with NG x 1. Takes 3 times a week.     2. Iron deficient anemia - admit 10/2017 with anemia, transfused. EGD that admission without acute findings.  - eliquis held, plavix continued given recent stent - denies any blood in stools or urine.  - upcoming labs today with pcp.  - restarted eliquis 12/23/17.  - cannot take oral iron due to GI upset   - last check Hgb up to 11   3. COPD - followed by pulmonary   4. PAD - followed by vascular - admission 08/2017 with ischemia great toes. Had intervention on lower extremity vessels at that time.  -ultimately required ampuation bilateral great toes  - admit 11/2017 for poor healing right great toe amputation, critical limb ischemia - had I&D done of toe during that admission. Started on abx.    - from 01/2018 vascular note wounds have nearly healed.   5. PSVT/Palpitations. - nor ecent  symptoms  6. Afib - dilt stop during 04/2017 admission due to bradycardia. Metoptolol decreased due to wheezingduring that admission   - no recent palpitatoins.    7. Chronic diastolic HF - lasix increased to 40mg  daily during recent admision, doing well since discharge.  - mild edema at times.  - home weights 205-210 and stable.   8. HTN -several med changes during 04/2017. Dilt 240 stopped, HCTZ stopped. Started on norvasc 5.  - home bp's 160s/80s - losartan caused SOB,he isunsure if he has been on lisinoprilin the past  - compliant with meds    Past Medical History:  Diagnosis Date  . Anemia    a. mild, noted 04/2017.  [name] CAD in native artery    a. [name] 04/2017 s/p DES to D1, DES to prox-mid LAD, DES to prox LAD overlapping the prior stent, LVEF 55-65%.   . Chronic diastolic CHF (congestive heart failure) ([place])   . Diabetic ulcer of toe ([place])   . DJD (degenerative joint disease) of cervical spine   . Essential hypertension, benign   . GERD (gastroesophageal reflux disease)   . History of hiatal hernia   . HIT (heparin-induced thrombocytopenia) ([place])   . Hypothyroidism   . Hypoxia    a. went home on home O2 04/2017.  [name] Insomnia   . Mixed hyperlipidemia   . PAD (peripheral artery disease) ([name])   . PAF (paroxysmal atrial  fibrillation) ([place])   . PVD (peripheral vascular disease) ([place])   . Renal insufficiency   . 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  . Sinus bradycardia    a. HR 30s-40s in 04/2017 -> diltiazem stopped, metoprolol reduced.  . Sleep apnea    "chose not to order CPAP at this time" (05/18/2017)  . Type 2 diabetes mellitus ([place])   . Wheezing    a. suspected COPD 04/2017. Former tobacco x 40 years.     Allergies  Allergen Reactions  . Bactrim [Sulfamethoxazole-Trimethoprim]     Pancytopenia  . Codeine Shortness Of Breath  . Heparin Other (See Comments)    +HIT,  Severe bleeding (with heparin drip & large doses)  . Losartan Swelling     Per ENT UNSPECIFIED REACTION   . Other Other (See Comments)    Severe bleeding UNSPECIFIED AGENT   . Oxycodone Other (See Comments)    "Made me act out of my mind" Other reaction(s): Other (See Comments) Mental status changes hallucinations     Current Outpatient Medications  Medication Sig Dispense Refill  . acetaminophen (TYLENOL) 500 MG tablet Take 500 mg by mouth as needed for mild pain or headache.    . albuterol (PROVENTIL) (2.5 MG/3ML) 0.083% nebulizer solution Inhale 3 mLs into the lungs every 4 (four) hours as needed for wheezing or shortness of breath. 75 mL 12  . amLODipine (NORVASC) 10 MG tablet TAKE 1 TABLET EVERY DAY (DOSE INCREASE. DISCONTINUE AMLODIPINE 5MG  AND DILTIAZEM) 90 tablet 3  . apixaban (ELIQUIS) 5 MG TABS tablet Take 1 tablet (5 mg total) by mouth 2 (two) times daily. 180 tablet 3  . atorvastatin (LIPITOR) 80 MG tablet Take 1 tablet (80 mg total) by mouth every evening. 30 tablet 0  . Cholecalciferol (D3-1000 PO) Take 1,000 Units by mouth daily.     . clopidogrel (PLAVIX) 75 MG tablet Take 1 tablet (75 mg total) by mouth daily. 30 tablet 0  . collagenase (SANTYL) ointment Apply 1 application topically daily. 30 g 1  . Cyanocobalamin (B-12 PO) Take 1 tablet by mouth daily.     [name] doxycycline (VIBRA-TABS) 100 MG tablet Take 1 tablet (100 mg total) by mouth 2 (two) times daily. (Patient not taking: Reported on 02/05/2018) 60 tablet 0  . DROPLET INSULIN SYRINGE 30G X 1/2" 1 ML MISC     . finasteride (PROSCAR) 5 MG tablet Take 5 mg by mouth daily.     . folic acid (FOLVITE) 1 MG tablet Take 1 tablet (1 mg total) by mouth daily. 30 tablet 1  . furosemide (LASIX) 40 MG tablet Take 1 tablet (40 mg total) by mouth daily. 30 tablet 2  . insulin glargine (LANTUS) 100 UNIT/ML injection Inject 30-40 Units into the skin See admin instructions. Inject 40 units SQ in the morning and inject 30 units SQ at bedtime    . lisinopril (PRINIVIL,ZESTRIL) 5 MG tablet     . meclizine  (ANTIVERT) 12.5 MG tablet Take 1 tablet (12.5 mg total) by mouth 3 (three) times daily as needed for dizziness. 30 tablet 0  . metFORMIN (GLUCOPHAGE) 1000 MG tablet Take 1,000 mg by mouth 2 (two) times daily.    . metoprolol succinate (TOPROL-XL) 100 MG 24 hr tablet Take 1 tablet (100 mg total) by mouth daily. 30 tablet 1  . metoprolol tartrate (LOPRESSOR) 100 MG tablet     . nitroGLYCERIN (NITROSTAT) 0.4 MG SL tablet PLACE 1 TABLET (0.4 MG TOTAL) UNDER THE TONGUE  EVERY 5  MINUTES AS NEEDED FOR CHEST PAIN. (Patient taking differently: Place 0.4 mg under the tongue every 5 (five) minutes as needed. ) 25 tablet 3  . pantoprazole (PROTONIX) 40 MG tablet Take 1 tablet (40 mg total) by mouth daily. To protect stomach while taking multiple blood thinners. 30 tablet 2  . psyllium (METAMUCIL) 58.6 % packet Take 1 packet by mouth daily.    . traMADol (ULTRAM) 50 MG tablet Take 1 tablet (50 mg total) by mouth every 6 (six) hours as needed for moderate pain. (Patient not taking: Reported on 02/05/2018) 15 tablet 0  . traZODone (DESYREL) 100 MG tablet Take 100 mg by mouth at bedtime.      No current facility-administered medications for this visit.      Past Surgical History:  Procedure Laterality Date  . ABDOMINAL AORTOGRAM W/LOWER EXTREMITY N/A 09/15/2017   Procedure: ABDOMINAL AORTOGRAM W/LOWER EXTREMITY;  Surgeon: Serafina Mitchell, MD;  Location: [facility];  Service: Cardiovascular;  Laterality: N/A;  . AMPUTATION Bilateral 09/20/2017   Procedure: BILATERAL GREAT TOE AMPUTATIONS INCLUDING METATARSAL HEADS;  Surgeon: Serafina Mitchell, MD;  Location: [facility];  Service: Vascular;  Laterality: Bilateral;  . ANGIOPLASTY Right 09/20/2017   Procedure: ANGIOPLASTY RIGHT TIBIAL ARTERY;  Surgeon: Serafina Mitchell, MD;  Location: [facility];  Service: Vascular;  Laterality: Right;  . APPENDECTOMY    . BACK SURGERY    . CARDIAC CATHETERIZATION  1980s; 2012;  . CATARACT EXTRACTION Left 2004  . COLONOSCOPY  2004     FLEISHMAN TICS  . CORONARY ANGIOPLASTY WITH STENT PLACEMENT  05/18/2017   "3 stents"  . CORONARY STENT INTERVENTION N/A 05/18/2017   Procedure: CORONARY STENT INTERVENTION;  Surgeon: Jettie Booze, MD;  Location: [place] [facility];  Service: Cardiovascular;  Laterality: N/A;  . ESOPHAGOGASTRODUODENOSCOPY (EGD) WITH PROPOFOL N/A 11/20/2017   Procedure: ESOPHAGOGASTRODUODENOSCOPY (EGD) WITH PROPOFOL;  Surgeon: Irene Shipper, MD;  Location: [place] [facility];  Service: Gastroenterology;  Laterality: N/A;  . I&D EXTREMITY Right 12/17/2017   Procedure: IRRIGATION AND DEBRIDEMENT RIGHT GREAT TOE;  Surgeon: Serafina Mitchell, MD;  Location: [facility];  Service: Vascular;  Laterality: Right;  . JOINT REPLACEMENT    . LEFT HEART CATH AND CORONARY ANGIOGRAPHY N/A 05/18/2017   Procedure: LEFT HEART CATH AND CORONARY ANGIOGRAPHY;  Surgeon: Jettie Booze, MD;  Location: [facility];  Service: Cardiovascular;  Laterality: N/A;  . LOWER EXTREMITY ANGIOGRAM Right 09/20/2017   Procedure: RIGHT LOWER LEG  ANGIOGRAM;  Surgeon: Serafina Mitchell, MD;  Location: [place];  Service: Vascular;  Laterality: Right;  . LUMBAR FUSION  2002   L3, 4 L4, 5 L5 S1 Fused by Dr. Glenna Fellows  . PERIPHERAL VASCULAR BALLOON ANGIOPLASTY Left 09/15/2017   Procedure: PERIPHERAL VASCULAR BALLOON ANGIOPLASTY;  Surgeon: Serafina Mitchell, MD;  Location: [place] [facility];  Service: Cardiovascular;  Laterality: Left;  PTA of Peroneal & Posterior Tibial  . POSTERIOR LUMBAR FUSION    . RHINOPLASTY    . RIGHT HEART CATH N/A 05/18/2017   Procedure: RIGHT HEART CATH;  Surgeon: Jettie Booze, MD;  Location: [place] [facility];  Service: Cardiovascular;  Laterality: N/A;  . TEE WITHOUT CARDIOVERSION N/A 09/18/2017   Procedure: TRANSESOPHAGEAL ECHOCARDIOGRAM (TEE);  Surgeon: Fay Records, MD;  Location[name];  Service: Cardiovascular;  Laterality: N/A;  . TOTAL KNEE ARTHROPLASTY Bilateral   . TRANSURETHRAL RESECTION OF  PROSTATE  2001   [place]  Allergies  Allergen Reactions  . Bactrim [Sulfamethoxazole-Trimethoprim]     Pancytopenia  . Codeine Shortness Of Breath  . Heparin Other (See Comments)    +HIT,  Severe bleeding (with heparin drip & large doses)  . Losartan Swelling    Per ENT UNSPECIFIED REACTION   . Other Other (See Comments)    Severe bleeding UNSPECIFIED AGENT   . Oxycodone Other (See Comments)    "Made me act out of my mind" Other reaction(s): Other (See Comments) Mental status changes hallucinations      Family History  Problem Relation Age of Onset  . Heart attack Father 61  . COPD Father   . COPD Mother   . Heart disease Mother   . Diabetes Mother   . Hypertension Sister   . CVA Sister   . Diabetes Sister   . Multiple sclerosis Sister      Social History Mr. Aldridge reports that he quit smoking about 35 years ago. His smoking use included cigarettes. He started smoking about 65 years ago. He has a 60.00 pack-year smoking history. He has never used smokeless tobacco. Mr. Rosengrant reports previous alcohol use.   Review of Systems CONSTITUTIONAL: No weight loss, fever, chills, weakness or fatigue.  HEENT: Eyes: No visual loss, blurred vision, double vision or yellow sclerae.No hearing loss, sneezing, congestion, runny nose or sore throat.  SKIN: No rash or itching.  CARDIOVASCULAR: per hpi RESPIRATORY: No shortness of breath, cough or sputum.  GASTROINTESTINAL: No anorexia, nausea, vomiting or diarrhea. No abdominal pain or blood.  GENITOURINARY: No burning on urination, no polyuria NEUROLOGICAL: No headache, dizziness, syncope, paralysis, ataxia, numbness or tingling in the extremities. No change in bowel or bladder control.  MUSCULOSKELETAL: No muscle, back pain, joint pain or stiffness.  LYMPHATICS: No enlarged nodes. No history of splenectomy.  PSYCHIATRIC: No history of depression or anxiety.  ENDOCRINOLOGIC: No reports of sweating, cold or heat  intolerance. No polyuria or polydipsia.  [name]   Physical Examination Vitals:   04/04/18 1355  BP: (!) 116/57  Pulse: 62  SpO2: 97%   Vitals:   04/04/18 1355  Weight: 209 lb 12.8 oz (95.2 kg)  Height: 5\' 7"  (1.702 m)    Gen: resting comfortably, no acute distress HEENT: no scleral icterus, pupils equal round and reactive, no palptable cervical adenopathy,  CV: RRR, no m/r/g, no jvd Resp: Clear to auscultation bilaterally GI: abdomen is soft, non-tender, non-distended, normal bowel sounds, no hepatosplenomegaly MSK: extremities are warm, no edema.  Skin: warm, no rash Neuro:  no focal deficits Psych: appropriate affect   Diagnostic Studies 05/2015 echo Study Conclusions  - Left ventricle: The cavity size was normal. Wall thickness was increased increased in a pattern of mild to moderate LVH. Systolic function was vigorous. The estimated ejection fraction was in the range of 65% to 70%. Wall motion was normal; there were no regional wall motion abnormalities. The study is not technically sufficient to allow evaluation of LV diastolic function. - Aortic valve: Moderately calcified annulus. Mildly thickened leaflets. There was mild stenosis. There was mild regurgitation. Mean gradient (S): 13 mm Hg. Valve area (VTI): 1.51 cm^2. - Mitral valve: Mildly calcified annulus. Normal thickness leaflets . - Left atrium: The atrium was moderately dilated. - Technically adequate study.   06/2015 Nuclear stress test  No diagnostic ST segment changes to indicate ischemia.  Small, mild intensity, perfusion defects noted in the apical anterior and basal inferolateral walls. This is most consistent with soft tissue attenuation given  normal wall motion in these regions. No large ischemic zones noted.  This is a low risk study.  Nuclear stress EF: 64%.  05/2016 Event monitor  Rhythm is atrial fibrillation throughout study. Occasioanal PVCs  Min HR 51, Max HR  107, Avg HR 67  Reported symptoms correspond with rate controlled atrial fibrillation.  09/2016 nuclear stress  There was no ST segment deviation noted during stress.  The study is normal. There are no perfusion defects consistent with prior infarct or current ischemia.  This is a low risk study.  The left ventricular ejection fraction is normal (55-65%).   04/2017 cath  Dist RCA lesion is 40% stenosed.  Prox RCA lesion is 25% stenosed.  1st Diag lesion is 75% stenosed.  A drug-eluting stent was successfully placed using a STENT SYNERGY DES 2.25X12.  Post intervention, there is a 0% residual stenosis.  Prox LAD to Mid LAD lesion is 75% stenosed.  A drug-eluting stent was successfully placed using a STENT SYNERGY DES 3.5X38.  Post intervention, there is a 0% residual stenosis.  Prox LAD lesion is 70% stenosed.  A drug-eluting stent was successfully placed using a STENT SYNERGY DES 4X12, overlapping the prior stent.  Post intervention, there is a 0% residual stenosis.  The left ventricular systolic function is normal.  The left ventricular ejection fraction is 55-65% by visual estimate.  LV end diastolic pressure is normal.  There is no aortic valve stenosis.  Ao sat 98%, PA sat 65%; PA mean 25 mm Hg; unable to wedge the catheter.  Normal right heat pressures.  Tortuous right subclavian making catheter navigation difficult from the right radial approach.  Continue dual antiplatelet therapy along with Eliquis for 30 days.  Restart Eliquis tomorrow.  After 30 days, stop aspirin. Continue Plavix and Eliquis.   After 6 months, can consider stopping clopidogrel if he has bleeding issues. Given the nomber of stents, would try to complete one year of clopidogrel if no bleeding issues.     Assessment and Plan  1.CAD - PCI as reported above 04/2017 - some recent chest pain that is overall stable, will try adding imdur 15mg  daily as additional  antianginal therapy.     2. Afib -no symptoms, continue current meds   3. Chronic diastolic HF -appears euvolemic,continue current meds  4. HTN -he is at goal, continue current meds   F/u 2 months reassess chest pain symptoms     Carlyle Dolly MD

## 2018-04-06 DIAGNOSIS — Z89412 Acquired absence of left great toe: Secondary | ICD-10-CM | POA: Diagnosis not present

## 2018-04-06 DIAGNOSIS — A4902 Methicillin resistant Staphylococcus aureus infection, unspecified site: Secondary | ICD-10-CM | POA: Diagnosis not present

## 2018-04-06 DIAGNOSIS — I96 Gangrene, not elsewhere classified: Secondary | ICD-10-CM | POA: Diagnosis not present

## 2018-04-06 DIAGNOSIS — Z89411 Acquired absence of right great toe: Secondary | ICD-10-CM | POA: Diagnosis not present

## 2018-04-06 DIAGNOSIS — R7881 Bacteremia: Secondary | ICD-10-CM | POA: Diagnosis not present

## 2018-04-06 DIAGNOSIS — I5032 Chronic diastolic (congestive) heart failure: Secondary | ICD-10-CM | POA: Diagnosis not present

## 2018-04-10 DIAGNOSIS — M0600 Rheumatoid arthritis without rheumatoid factor, unspecified site: Secondary | ICD-10-CM | POA: Diagnosis not present

## 2018-04-10 DIAGNOSIS — E1165 Type 2 diabetes mellitus with hyperglycemia: Secondary | ICD-10-CM | POA: Diagnosis not present

## 2018-04-10 DIAGNOSIS — I25118 Atherosclerotic heart disease of native coronary artery with other forms of angina pectoris: Secondary | ICD-10-CM | POA: Diagnosis not present

## 2018-04-10 DIAGNOSIS — I509 Heart failure, unspecified: Secondary | ICD-10-CM | POA: Diagnosis not present

## 2018-04-10 DIAGNOSIS — E261 Secondary hyperaldosteronism: Secondary | ICD-10-CM | POA: Diagnosis not present

## 2018-04-10 DIAGNOSIS — I4891 Unspecified atrial fibrillation: Secondary | ICD-10-CM | POA: Diagnosis not present

## 2018-04-10 DIAGNOSIS — E1151 Type 2 diabetes mellitus with diabetic peripheral angiopathy without gangrene: Secondary | ICD-10-CM | POA: Diagnosis not present

## 2018-04-10 DIAGNOSIS — Z6831 Body mass index (BMI) 31.0-31.9, adult: Secondary | ICD-10-CM | POA: Diagnosis not present

## 2018-04-10 DIAGNOSIS — D6869 Other thrombophilia: Secondary | ICD-10-CM | POA: Diagnosis not present

## 2018-04-10 DIAGNOSIS — J9611 Chronic respiratory failure with hypoxia: Secondary | ICD-10-CM | POA: Diagnosis not present

## 2018-04-10 DIAGNOSIS — Z7901 Long term (current) use of anticoagulants: Secondary | ICD-10-CM | POA: Diagnosis not present

## 2018-04-10 DIAGNOSIS — E11621 Type 2 diabetes mellitus with foot ulcer: Secondary | ICD-10-CM | POA: Diagnosis not present

## 2018-04-10 DIAGNOSIS — Z794 Long term (current) use of insulin: Secondary | ICD-10-CM | POA: Diagnosis not present

## 2018-04-10 DIAGNOSIS — L97521 Non-pressure chronic ulcer of other part of left foot limited to breakdown of skin: Secondary | ICD-10-CM | POA: Diagnosis not present

## 2018-04-10 DIAGNOSIS — Z9981 Dependence on supplemental oxygen: Secondary | ICD-10-CM | POA: Diagnosis not present

## 2018-04-10 DIAGNOSIS — Z89412 Acquired absence of left great toe: Secondary | ICD-10-CM | POA: Diagnosis not present

## 2018-05-07 DIAGNOSIS — I5032 Chronic diastolic (congestive) heart failure: Secondary | ICD-10-CM | POA: Diagnosis not present

## 2018-05-07 DIAGNOSIS — I96 Gangrene, not elsewhere classified: Secondary | ICD-10-CM | POA: Diagnosis not present

## 2018-05-07 DIAGNOSIS — Z89412 Acquired absence of left great toe: Secondary | ICD-10-CM | POA: Diagnosis not present

## 2018-05-07 DIAGNOSIS — A4902 Methicillin resistant Staphylococcus aureus infection, unspecified site: Secondary | ICD-10-CM | POA: Diagnosis not present

## 2018-05-07 DIAGNOSIS — Z89411 Acquired absence of right great toe: Secondary | ICD-10-CM | POA: Diagnosis not present

## 2018-05-07 DIAGNOSIS — R7881 Bacteremia: Secondary | ICD-10-CM | POA: Diagnosis not present

## 2018-05-14 ENCOUNTER — Encounter: Payer: Self-pay | Admitting: Surgery

## 2018-05-14 ENCOUNTER — Ambulatory Visit: Payer: Medicare HMO | Admitting: Surgery

## 2018-05-14 ENCOUNTER — Other Ambulatory Visit: Payer: Self-pay

## 2018-05-14 ENCOUNTER — Ambulatory Visit (HOSPITAL_COMMUNITY): Payer: Medicare HMO

## 2018-05-14 ENCOUNTER — Ambulatory Visit (HOSPITAL_COMMUNITY)
Admission: RE | Admit: 2018-05-14 | Discharge: 2018-05-14 | Disposition: A | Discharge status: Home / Self Care | Payer: Medicare HMO | Source: Ambulatory Visit | Attending: Surgery | Admitting: Surgery

## 2018-05-14 VITALS — Resp 18 | Ht 67.0 in | Wt 205.0 lb

## 2018-05-14 DIAGNOSIS — I739 Peripheral vascular disease, unspecified: Secondary | ICD-10-CM

## 2018-05-14 NOTE — Progress Notes (Signed)
[facility] [place]  Patient name: Mike Wright MRN: [id number] DOB: [birth year] Sex: male   REASON FOR VISIT:    Follow up  HISOTRY OF PRESENT ILLNESS:    [name] a 85 y.o.malewho is status post balloon angioplasty of the left peroneal and posterior tibial artery as well as the right posterior tibial artery in June 2019. 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 He has been getting wound care. When he presented to the office in September, he had cellulitis on his foot and was admitted for IV antibiotics.   On 12/17/2017, I took him to the operating room for I&D of his right great toe including resection of additional bone. His Cellulitis resolved and his owunds healded     He has a hx of chest pain and underwent cardiac catheterization in February 2019 with stent placement.  He remains on Plavix. He is on Eliquis for PAF.  He is on insulin for diabetes.  He is on nebs and inhalers for his COPD.  The pt is on a statin for cholesterol management.  He takes a beta blocker, ACEI and CCB for blood pressure management.     PAST MEDICAL HISTORY:   Past Medical History:  Diagnosis Date  . Anemia    a. mild, noted 04/2017.  [name] CAD in native artery    a. [name] 04/2017 s/p DES to D1, DES to prox-mid LAD, DES to prox LAD overlapping the prior stent, LVEF 55-65%.   . Chronic diastolic CHF (congestive heart failure) ([place])   . Diabetic ulcer of toe ([place])   . DJD (degenerative joint disease) of cervical spine   . Essential hypertension, benign   . GERD (gastroesophageal reflux disease)   . History of hiatal hernia   . HIT (heparin-induced thrombocytopenia) ([place])   . Hypothyroidism   . Hypoxia    a. went home on home O2 04/2017.  [name] Insomnia   . Mixed hyperlipidemia   . PAD (peripheral artery disease) ([place])   . PAF (paroxysmal atrial fibrillation) ([place])   . PVD (peripheral vascular disease) ([place])   . Renal  insufficiency   . 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  . Sinus bradycardia    a. HR 30s-40s in 04/2017 -> diltiazem stopped, metoprolol reduced.  . Sleep apnea    "chose not to order CPAP at this time" (05/18/2017)  . Type 2 diabetes mellitus ([place])   . Wheezing    a. suspected COPD 04/2017. Former tobacco x 40 years.     FAMILY HISTORY:   Family History  Problem Relation Age of Onset  . Heart attack Father 72  . COPD Father   . COPD Mother   . Heart disease Mother   . Diabetes Mother   . Hypertension Sister   . CVA Sister   . Diabetes Sister   . Multiple sclerosis Sister     SOCIAL HISTORY:   Social History   Tobacco Use  . Smoking status: Former Smoker    Packs/day: 2.00    Years: 30.00    Pack years: 60.00    Types: Cigarettes    Start date: 03/28/1953    Last attempt to quit: 03/29/1983    Years since quitting: 35.1  . Smokeless tobacco: Never Used  Substance Use Topics  . Alcohol use: Not Currently    Comment: 05/18/2017 'I drink a beer q yr"     ALLERGIES:   Allergies  Allergen Reactions  . Bactrim [Sulfamethoxazole-Trimethoprim]     Pancytopenia  . Codeine Shortness Of Breath  . Heparin Other (See Comments)    +HIT,  Severe bleeding (with heparin drip & large doses)  . Losartan Swelling    Per ENT UNSPECIFIED REACTION   . Other Other (See Comments)    Severe bleeding UNSPECIFIED AGENT   . Oxycodone Other (See Comments)    "Made me act out of my mind" Other reaction(s): Other (See Comments) Mental status changes hallucinations     CURRENT MEDICATIONS:   Current Outpatient Medications  Medication Sig Dispense Refill  . acetaminophen (TYLENOL) 500 MG tablet Take 500 mg by mouth as needed for mild pain or headache.    . albuterol (PROVENTIL) (2.5 MG/3ML) 0.083% nebulizer solution Inhale 3 mLs into the lungs every 4 (four) hours as needed for wheezing or shortness of breath. 75 mL 12  . amLODipine (NORVASC) 10 MG tablet TAKE 1 TABLET EVERY  DAY (DOSE INCREASE. DISCONTINUE AMLODIPINE 5MG  AND DILTIAZEM) 90 tablet 3  . apixaban (ELIQUIS) 5 MG TABS tablet Take 1 tablet (5 mg total) by mouth 2 (two) times daily. 180 tablet 3  . atorvastatin (LIPITOR) 80 MG tablet Take 1 tablet (80 mg total) by mouth every evening. 30 tablet 0  . Cholecalciferol (D3-1000 PO) Take 1,000 Units by mouth daily.     . clopidogrel (PLAVIX) 75 MG tablet Take 1 tablet (75 mg total) by mouth daily. 30 tablet 0  . collagenase (SANTYL) ointment Apply 1 application topically daily. 30 g 1  . Cyanocobalamin (B-12 PO) Take 1 tablet by mouth daily.     . DROPLET INSULIN SYRINGE 30G X 1/2" 1 ML MISC     . folic acid (FOLVITE) 1 MG tablet Take 1 tablet (1 mg total) by mouth daily. 30 tablet 1  . furosemide (LASIX) 40 MG tablet Take 1 tablet (40 mg total) by mouth daily. 30 tablet 2  . insulin glargine (LANTUS) 100 UNIT/ML injection Inject 30-40 Units into the skin See admin instructions. Inject 40 units SQ in the morning and inject 30 units SQ at bedtime    . isosorbide mononitrate (IMDUR) 30 MG 24 hr tablet Take 0.5 tablets (15 mg total) by mouth daily. 45 tablet 3  . lisinopril (PRINIVIL,ZESTRIL) 5 MG tablet     . meclizine (ANTIVERT) 12.5 MG tablet Take 1 tablet (12.5 mg total) by mouth 3 (three) times daily as needed for dizziness. 30 tablet 0  . metFORMIN (GLUCOPHAGE) 1000 MG tablet Take 1,000 mg by mouth 2 (two) times daily.    . metoprolol succinate (TOPROL-XL) 100 MG 24 hr tablet Take 1 tablet (100 mg total) by mouth daily. 30 tablet 1  . metoprolol tartrate (LOPRESSOR) 100 MG tablet     . nitroGLYCERIN (NITROSTAT) 0.4 MG SL tablet PLACE 1 TABLET (0.4 MG TOTAL) UNDER THE TONGUE EVERY 5  MINUTES AS NEEDED FOR CHEST PAIN. (Patient taking differently: Place 0.4 mg under the tongue every 5 (five) minutes as needed. ) 25 tablet 3  . pantoprazole (PROTONIX) 40 MG tablet Take 1 tablet (40 mg total) by mouth daily. To protect stomach while taking multiple blood thinners. 30  tablet 2  . psyllium (METAMUCIL) 58.6 % packet Take 1 packet by mouth daily.    . traMADol (ULTRAM) 50 MG tablet Take 1 tablet (50 mg total) by mouth every 6 (six) hours as needed for moderate pain. 15 tablet 0  . traZODone (DESYREL) 100 MG tablet Take 100 mg  by mouth at bedtime.      No current facility-administered medications for this visit.     REVIEW OF SYSTEMS:   [X]  denotes positive finding, [ ]  denotes negative finding Cardiac  Comments:  Chest pain or chest pressure:    Shortness of breath upon exertion:    Short of breath when lying flat:    Irregular heart rhythm:        Vascular    Pain in calf, thigh, or hip brought on by ambulation:    Pain in feet at night that wakes you up from your sleep:     Blood clot in your veins:    Leg swelling:         Pulmonary    Oxygen at home:    Productive cough:     Wheezing:         Neurologic    Sudden weakness in arms or legs:     Sudden numbness in arms or legs:     Sudden onset of difficulty speaking or slurred speech:    Temporary loss of vision in one eye:     Problems with dizziness:         Gastrointestinal    Blood in stool:     Vomited blood:         Genitourinary    Burning when urinating:     Blood in urine:        Psychiatric    Major depression:         Hematologic    Bleeding problems:    Problems with blood clotting too easily:        Skin    Rashes or ulcers:        Constitutional    Fever or chills:      PHYSICAL EXAM:   There were no vitals filed for this visit.  GENERAL: The patient is a well-nourished male, in no acute distress. The vital signs are documented above. CARDIAC: There is a regular rate  PULMONARY: Non-labored respirations.  MUSCULOSKELETAL: There are no major deformities or cyanosis. NEUROLOGIC: No focal weakness or paresthesias are detected. SKIN: The amputation sites of both great toes have healed.  I sharply debrided the callus off of the right side and use silver  nitrate to touch up a small area on the left. PSYCHIATRIC: The patient has a normal affect.  STUDIES:   I have ordered and reviewed his u/s with the following findings: Right: Two areas in the proximal SFA wit h50-74% stenosis.  Left: No stenosis noted in the left lower extremity.   MEDICAL ISSUES:   The patient has healed his wounds completely on the right.  There is a small opening on the left which we will place a wick and.  It is no more than 0.5 mm deep.  This should heal with time.  It was cauterized with silver nitrate today.  A dry dressing is sufficient.  Ultrasound today shows to moderate stenosis on the right within the native artery.  I will continue to observe these.  There is no indication for intervention at this time.  He will follow-up in 6 months with repeat vascular lab studies.    [name], MD [facility] [phone] Pager [phone]

## 2018-05-16 ENCOUNTER — Ambulatory Visit: Payer: Medicare HMO | Admitting: Urology

## 2018-05-16 DIAGNOSIS — N471 Phimosis: Secondary | ICD-10-CM

## 2018-06-04 DIAGNOSIS — Z794 Long term (current) use of insulin: Secondary | ICD-10-CM | POA: Diagnosis not present

## 2018-06-04 DIAGNOSIS — E1151 Type 2 diabetes mellitus with diabetic peripheral angiopathy without gangrene: Secondary | ICD-10-CM | POA: Diagnosis not present

## 2018-06-04 DIAGNOSIS — E1159 Type 2 diabetes mellitus with other circulatory complications: Secondary | ICD-10-CM | POA: Diagnosis not present

## 2018-06-04 DIAGNOSIS — G4733 Obstructive sleep apnea (adult) (pediatric): Secondary | ICD-10-CM | POA: Diagnosis not present

## 2018-06-04 DIAGNOSIS — E1165 Type 2 diabetes mellitus with hyperglycemia: Secondary | ICD-10-CM | POA: Diagnosis not present

## 2018-06-04 DIAGNOSIS — I2510 Atherosclerotic heart disease of native coronary artery without angina pectoris: Secondary | ICD-10-CM | POA: Diagnosis not present

## 2018-06-04 DIAGNOSIS — Z6833 Body mass index (BMI) 33.0-33.9, adult: Secondary | ICD-10-CM | POA: Diagnosis not present

## 2018-06-05 DIAGNOSIS — I96 Gangrene, not elsewhere classified: Secondary | ICD-10-CM | POA: Diagnosis not present

## 2018-06-05 DIAGNOSIS — I5032 Chronic diastolic (congestive) heart failure: Secondary | ICD-10-CM | POA: Diagnosis not present

## 2018-06-05 DIAGNOSIS — Z89411 Acquired absence of right great toe: Secondary | ICD-10-CM | POA: Diagnosis not present

## 2018-06-05 DIAGNOSIS — Z89412 Acquired absence of left great toe: Secondary | ICD-10-CM | POA: Diagnosis not present

## 2018-06-07 ENCOUNTER — Other Ambulatory Visit: Payer: Self-pay

## 2018-06-07 ENCOUNTER — Encounter: Payer: Self-pay | Admitting: Cardiology

## 2018-06-07 ENCOUNTER — Ambulatory Visit: Payer: Medicare HMO | Admitting: Cardiology

## 2018-06-07 VITALS — BP 125/69 | HR 73 | Ht 67.0 in | Wt 218.0 lb

## 2018-06-07 DIAGNOSIS — I4891 Unspecified atrial fibrillation: Secondary | ICD-10-CM

## 2018-06-07 DIAGNOSIS — I5033 Acute on chronic diastolic (congestive) heart failure: Secondary | ICD-10-CM | POA: Diagnosis not present

## 2018-06-07 DIAGNOSIS — I10 Essential (primary) hypertension: Secondary | ICD-10-CM

## 2018-06-07 DIAGNOSIS — I2510 Atherosclerotic heart disease of native coronary artery without angina pectoris: Secondary | ICD-10-CM | POA: Diagnosis not present

## 2018-06-07 MED ORDER — FUROSEMIDE 40 MG PO TABS: 40.0000 mg | ORAL_TABLET | Freq: Two times a day (BID) | ORAL | 1 refills | Status: DC

## 2018-06-07 NOTE — Patient Instructions (Signed)
Your physician recommends that you schedule a follow-up appointment in: [place] has recommended you make the following change in your medication:   CHANGE LASIX 40 MG TWICE DAILY   CALL us IN 1 WEEK WITH YOUR WEIGHT  Thank you for choosing [facility]!!

## 2018-06-07 NOTE — Progress Notes (Signed)
Clinical Summary Mr. Sporer is a 85 y.o.male seen today for follow up of the following medical problems.  1. CAD - nonobstructive CAD by cath Jan 2012, LVEF 60-65% by LV gram.  - 06/2015 nuclear stress without clear ischemia - 05/2015 echo LVEF 65-70%, no WMAs, cannot evaluate diastolic function 08/5033 lexiscan without ischemia, low risk study.  -cath 04/2017 as reported below. Received DES to 75% D1, DES to 75% mid to distal LAD, DES to 70% prox LAD RHC with CI 2.67, mean PA 25, no wedge reported by LVEDP 14.  - discharged on triple therapy with ASA, plavix, eliquis with plan for 30 days, then stop ASA.   - 11/22/17 admission with chest pain, anemia. Negative workup for ACS. Symptomsresolved with blood transfusion - 11/2017 admit with chest pain and dyspnea. ACS work negative, CT PE negative. Diuresed 6L with resolustion of symptoms. CXR with rib fracture thought playing a role in chest pain.    - still with chest pain. Dull pain midchest, 8/10 in severity. +SOB which is increasing. Not positional. Better with NG. -chest pain with increase frequency, stable severity - increased DOE with activity, room to room at home.  - some abdominal distension, increased orthopnea.  - compliant with meds.    2. Iron deficient anemia - admit 10/2017 with anemia, transfused. EGD that admission without acute findings.  - eliquis held, plavix continued given recent stent - denies any blood in stools or urine.  - upcoming labs today with pcp.  - restarted eliquis 12/23/17.  - cannot take oral iron due toGIupset  - last check Hgb up to 11   3. COPD - followed by pulmonary   4. PAD - followed by vascular - admission 08/2017 with ischemia great toes. Had intervention on lower extremity vessels at that time.  -ultimately required ampuation bilateral great toes  - admit 11/2017 for poor healing right great toe amputation, critical limb ischemia - had I&D done of toe  during that admission. Started on abx. - from 01/2018 vascular note wounds have nearly healed.   5. PSVT/Palpitations. -denies any symptoms  6. Afib - dilt stop during 04/2017 admission due to bradycardia. Metoptolol decreased due to wheezingduring that admission   - denies any palpitations   7. Chronic diastolic HF - weight up from 205 in 04/2018 to 218 today. Increased SOB/DOE    8. HTN -several med changes during 04/2017. Dilt 240 stopped, HCTZ stopped. Started on norvasc 5.  - losartan caused SOB,he isunsure if he has been on lisinoprilin the past  - he is compliant with meds   Past Medical History:  Diagnosis Date  . Anemia    a. mild, noted 04/2017.  [name] CAD in native artery    a. [name] 04/2017 s/p DES to D1, DES to prox-mid LAD, DES to prox LAD overlapping the prior stent, LVEF 55-65%.   . Chronic diastolic CHF (congestive heart failure) ([place])   . Diabetic ulcer of toe ([place])   . DJD (degenerative joint disease) of cervical spine   . Essential hypertension, benign   . GERD (gastroesophageal reflux disease)   . History of hiatal hernia   . HIT (heparin-induced thrombocytopenia) ([place])   . Hypothyroidism   . Hypoxia    a. went home on home O2 04/2017.  [name] Insomnia   . Mixed hyperlipidemia   . PAD (peripheral artery disease) ([place])   . PAF (paroxysmal atrial fibrillation) ([place])   . PVD (peripheral vascular disease) ([place])   .  Renal insufficiency   . 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  . Sinus bradycardia    a. HR 30s-40s in 04/2017 -> diltiazem stopped, metoprolol reduced.  . Sleep apnea    "chose not to order CPAP at this time" (05/18/2017)  . Type 2 diabetes mellitus ([place])   . Wheezing    a. suspected COPD 04/2017. Former tobacco x 40 years.     Allergies  Allergen Reactions  . Bactrim [Sulfamethoxazole-Trimethoprim]     Pancytopenia  . Codeine Shortness Of Breath  . Heparin Other (See Comments)    +HIT,  Severe bleeding (with heparin drip  & large doses)  . Losartan Swelling    Per ENT UNSPECIFIED REACTION   . Other Other (See Comments)    Severe bleeding UNSPECIFIED AGENT   . Oxycodone Other (See Comments)    "Made me act out of my mind" Other reaction(s): Other (See Comments) Mental status changes hallucinations     Current Outpatient Medications  Medication Sig Dispense Refill  . acetaminophen (TYLENOL) 500 MG tablet Take 500 mg by mouth as needed for mild pain or headache.    . albuterol (PROVENTIL) (2.5 MG/3ML) 0.083% nebulizer solution Inhale 3 mLs into the lungs every 4 (four) hours as needed for wheezing or shortness of breath. 75 mL 12  . amLODipine (NORVASC) 10 MG tablet TAKE 1 TABLET EVERY DAY (DOSE INCREASE. DISCONTINUE AMLODIPINE 5MG  AND DILTIAZEM) 90 tablet 3  . apixaban (ELIQUIS) 5 MG TABS tablet Take 1 tablet (5 mg total) by mouth 2 (two) times daily. 180 tablet 3  . atorvastatin (LIPITOR) 80 MG tablet Take 1 tablet (80 mg total) by mouth every evening. 30 tablet 0  . Cholecalciferol (D3-1000 PO) Take 1,000 Units by mouth daily.     . clopidogrel (PLAVIX) 75 MG tablet Take 1 tablet (75 mg total) by mouth daily. 30 tablet 0  . Cyanocobalamin (B-12 PO) Take 1 tablet by mouth daily.     . DROPLET INSULIN SYRINGE 30G X 1/2" 1 ML MISC     . folic acid (FOLVITE) 1 MG tablet Take 1 tablet (1 mg total) by mouth daily. 30 tablet 1  . furosemide (LASIX) 40 MG tablet Take 1 tablet (40 mg total) by mouth daily. 30 tablet 2  . insulin glargine (LANTUS) 100 UNIT/ML injection Inject 30-40 Units into the skin See admin instructions. Inject 40 units SQ in the morning and inject 30 units SQ at bedtime    . isosorbide mononitrate (IMDUR) 30 MG 24 hr tablet Take 0.5 tablets (15 mg total) by mouth daily. 45 tablet 3  . lisinopril (PRINIVIL,ZESTRIL) 5 MG tablet     . meclizine (ANTIVERT) 12.5 MG tablet Take 1 tablet (12.5 mg total) by mouth 3 (three) times daily as needed for dizziness. 30 tablet 0  . metFORMIN (GLUCOPHAGE)  1000 MG tablet Take 1,000 mg by mouth 2 (two) times daily.    . metoprolol succinate (TOPROL-XL) 100 MG 24 hr tablet Take 1 tablet (100 mg total) by mouth daily. 30 tablet 1  . metoprolol tartrate (LOPRESSOR) 100 MG tablet     . nitroGLYCERIN (NITROSTAT) 0.4 MG SL tablet PLACE 1 TABLET (0.4 MG TOTAL) UNDER THE TONGUE EVERY 5  MINUTES AS NEEDED FOR CHEST PAIN. (Patient taking differently: Place 0.4 mg under the tongue every 5 (five) minutes as needed. ) 25 tablet 3  . pantoprazole (PROTONIX) 40 MG tablet Take 1 tablet (40 mg total) by mouth daily. To protect stomach while  taking multiple blood thinners. 30 tablet 2  . psyllium (METAMUCIL) 58.6 % packet Take 1 packet by mouth daily.    . traZODone (DESYREL) 100 MG tablet Take 100 mg by mouth at bedtime.      No current facility-administered medications for this visit.      Past Surgical History:  Procedure Laterality Date  . ABDOMINAL AORTOGRAM W/LOWER EXTREMITY N/A 09/15/2017   Procedure: ABDOMINAL AORTOGRAM W/LOWER EXTREMITY;  Surgeon: Serafina Mitchell, MD;  Location: [facility];  Service: Cardiovascular;  Laterality: N/A;  . AMPUTATION Bilateral 09/20/2017   Procedure: BILATERAL GREAT TOE AMPUTATIONS INCLUDING METATARSAL HEADS;  Surgeon: Serafina Mitchell, MD;  Location: [facility];  Service: Vascular;  Laterality: Bilateral;  . ANGIOPLASTY Right 09/20/2017   Procedure: ANGIOPLASTY RIGHT TIBIAL ARTERY;  Surgeon: Serafina Mitchell, MD;  Location: [facility];  Service: Vascular;  Laterality: Right;  . APPENDECTOMY    . BACK SURGERY    . CARDIAC CATHETERIZATION  1980s; 2012;  . CATARACT EXTRACTION Left 2004  . COLONOSCOPY  2004   FLEISHMAN TICS  . CORONARY ANGIOPLASTY WITH STENT PLACEMENT  05/18/2017   "3 stents"  . CORONARY STENT INTERVENTION N/A 05/18/2017   Procedure: CORONARY STENT INTERVENTION;  Surgeon: Jettie Booze, MD;  Location: [facility];  Service: Cardiovascular;  Laterality: N/A;  . ESOPHAGOGASTRODUODENOSCOPY (EGD)  WITH PROPOFOL N/A 11/20/2017   Procedure: ESOPHAGOGASTRODUODENOSCOPY (EGD) WITH PROPOFOL;  Surgeon: Irene Shipper, MD;  Location: [facility];  Service: Gastroenterology;  Laterality: N/A;  . I&D EXTREMITY Right 12/17/2017   Procedure: IRRIGATION AND DEBRIDEMENT RIGHT GREAT TOE;  Surgeon: Serafina Mitchell, MD;  Location: [facility];  Service: Vascular;  Laterality: Right;  . JOINT REPLACEMENT    . LEFT HEART CATH AND CORONARY ANGIOGRAPHY N/A 05/18/2017   Procedure: LEFT HEART CATH AND CORONARY ANGIOGRAPHY;  Surgeon: Jettie Booze, MD;  Location: [facility];  Service: Cardiovascular;  Laterality: N/A;  . LOWER EXTREMITY ANGIOGRAM Right 09/20/2017   Procedure: RIGHT LOWER LEG  ANGIOGRAM;  Surgeon: Serafina Mitchell, MD;  Location: [facility];  Service: Vascular;  Laterality: Right;  . LUMBAR FUSION  2002   L3, 4 L4, 5 L5 S1 Fused by Dr. Glenna Fellows  . PERIPHERAL VASCULAR BALLOON ANGIOPLASTY Left 09/15/2017   Procedure: PERIPHERAL VASCULAR BALLOON ANGIOPLASTY;  Surgeon: Serafina Mitchell, MD;  Location: [facility];  Service: Cardiovascular;  Laterality: Left;  PTA of Peroneal & Posterior Tibial  . POSTERIOR LUMBAR FUSION    . RHINOPLASTY    . RIGHT HEART CATH N/A 05/18/2017   Procedure: RIGHT HEART CATH;  Surgeon: Jettie Booze, MD;  Location: [employer] [facility];  Service: Cardiovascular;  Laterality: N/A;  . TEE WITHOUT CARDIOVERSION N/A 09/18/2017   Procedure: TRANSESOPHAGEAL ECHOCARDIOGRAM (TEE);  Surgeon: Fay Records, MD;  Location: [facility];  Service: Cardiovascular;  Laterality: N/A;  . TOTAL KNEE ARTHROPLASTY Bilateral   . TRANSURETHRAL RESECTION OF PROSTATE  2001   [facility]     Allergies  Allergen Reactions  . Bactrim [Sulfamethoxazole-Trimethoprim]     Pancytopenia  . Codeine Shortness Of Breath  . Heparin Other (See Comments)    +HIT,  Severe bleeding (with heparin drip & large doses)  . Losartan Swelling    Per ENT UNSPECIFIED REACTION   . Other Other (See  Comments)    Severe bleeding UNSPECIFIED AGENT   . Oxycodone Other (See Comments)    "Made me act out of my mind"  Other reaction(s): Other (See Comments) Mental status changes hallucinations      Family History  Problem Relation Age of Onset  . Heart attack Father 49  . COPD Father   . COPD Mother   . Heart disease Mother   . Diabetes Mother   . Hypertension Sister   . CVA Sister   . Diabetes Sister   . Multiple sclerosis Sister      Social History Mr. Klopf reports that he quit smoking about 35 years ago. His smoking use included cigarettes. He started smoking about 65 years ago. He has a 60.00 pack-year smoking history. He has never used smokeless tobacco. Mr. Pechacek reports previous alcohol use.   Review of Systems CONSTITUTIONAL: No weight loss, fever, chills, weakness or fatigue.  HEENT: Eyes: No visual loss, blurred vision, double vision or yellow sclerae.No hearing loss, sneezing, congestion, runny nose or sore throat.  SKIN: No rash or itching.  CARDIOVASCULAR: per hpi RESPIRATORY: per hpi GASTROINTESTINAL: No anorexia, nausea, vomiting or diarrhea. No abdominal pain or blood.  GENITOURINARY: No burning on urination, no polyuria NEUROLOGICAL: No headache, dizziness, syncope, paralysis, ataxia, numbness or tingling in the extremities. No change in bowel or bladder control.  MUSCULOSKELETAL: No muscle, back pain, joint pain or stiffness.  LYMPHATICS: No enlarged nodes. No history of splenectomy.  PSYCHIATRIC: No history of depression or anxiety.  ENDOCRINOLOGIC: No reports of sweating, cold or heat intolerance. No polyuria or polydipsia.  [name]   Physical Examination Vitals:   06/07/18 1506  BP: 125/69  Pulse: 73  SpO2: 92%   Filed Weights   06/07/18 1506  Weight: 218 lb (98.9 kg)    Gen: resting comfortably, no acute distress HEENT: no scleral icterus, pupils equal round and reactive, no palptable cervical adenopathy,  CV: RRR, no m/r/g, no jvd Resp:  +crackles bilaterally GI: abdomen is soft, non-tender, non-distended, normal bowel sounds, no hepatosplenomegaly MSK: extremities are warm, 2+ bilateral LE edema Skin: warm, no rash Neuro:  no focal deficits Psych: appropriate affect   Diagnostic Studies  05/2015 echo Study Conclusions  - Left ventricle: The cavity size was normal. Wall thickness was increased increased in a pattern of mild to moderate LVH. Systolic function was vigorous. The estimated ejection fraction was in the range of 65% to 70%. Wall motion was normal; there were no regional wall motion abnormalities. The study is not technically sufficient to allow evaluation of LV diastolic function. - Aortic valve: Moderately calcified annulus. Mildly thickened leaflets. There was mild stenosis. There was mild regurgitation. Mean gradient (S): 13 mm Hg. Valve area (VTI): 1.51 cm^2. - Mitral valve: Mildly calcified annulus. Normal thickness leaflets . - Left atrium: The atrium was moderately dilated. - Technically adequate study.   06/2015 Nuclear stress test  No diagnostic ST segment changes to indicate ischemia.  Small, mild intensity, perfusion defects noted in the apical anterior and basal inferolateral walls. This is most consistent with soft tissue attenuation given normal wall motion in these regions. No large ischemic zones noted.  This is a low risk study.  Nuclear stress EF: 64%.  05/2016 Event monitor  Rhythm is atrial fibrillation throughout study. Occasioanal PVCs  Min HR 51, Max HR 107, Avg HR 67  Reported symptoms correspond with rate controlled atrial fibrillation.  09/2016 nuclear stress  There was no ST segment deviation noted during stress.  The study is normal. There are no perfusion defects consistent with prior infarct or current ischemia.  This is a low risk study.  The left ventricular ejection fraction is normal (55-65%).   04/2017 cath  Dist RCA lesion  is 40% stenosed.  Prox RCA lesion is 25% stenosed.  1st Diag lesion is 75% stenosed.  A drug-eluting stent was successfully placed using a STENT SYNERGY DES 2.25X12.  Post intervention, there is a 0% residual stenosis.  Prox LAD to Mid LAD lesion is 75% stenosed.  A drug-eluting stent was successfully placed using a STENT SYNERGY DES 3.5X38.  Post intervention, there is a 0% residual stenosis.  Prox LAD lesion is 70% stenosed.  A drug-eluting stent was successfully placed using a STENT SYNERGY DES 4X12, overlapping the prior stent.  Post intervention, there is a 0% residual stenosis.  The left ventricular systolic function is normal.  The left ventricular ejection fraction is 55-65% by visual estimate.  LV end diastolic pressure is normal.  There is no aortic valve stenosis.  Ao sat 98%, PA sat 65%; PA mean 25 mm Hg; unable to wedge the catheter.  Normal right heat pressures.  Tortuous right subclavian making catheter navigation difficult from the right radial approach.  Continue dual antiplatelet therapy along with Eliquis for 30 days.  Restart Eliquis tomorrow.  After 30 days, stop aspirin. Continue Plavix and Eliquis.   After 6 months, can consider stopping clopidogrel if he has bleeding issues. Given the nomber of stents, would try to complete one year of clopidogrel if no bleeding issues.    Assessment and Plan  1.CAD -PCI as reported above 04/2017 - some recent chest pain, he has had prior pain in setting of volume overload that is has resolved with diuresis. Clearly volume overloaded today, we will follow symptoms with diuresis - if persistent or progressive symptoms may need to consider repeat ischemic testing.     2. Afib -no recent symptoms, he will continue current meds   3. Acute on chronic diastolic HF -up 13 lbs from last month, volume overloaded and symptomatic - change lasix to 40mg  bid, he will call us next week to update [place]  on his weights and symptoms. Will need labs done soon  4. HTN -at goal, continue current meds  5. Preop evaluation - received request from urology for clearance for circumcision. Will need to see how he does with diuresis and regarding his chest pain, hold on surgery for now.         Arnoldo Lenis, M.D.

## 2018-06-12 ENCOUNTER — Encounter: Payer: Self-pay | Admitting: Cardiology

## 2018-06-14 ENCOUNTER — Telehealth: Payer: Self-pay | Admitting: Cardiology

## 2018-06-14 DIAGNOSIS — I10 Essential (primary) hypertension: Secondary | ICD-10-CM

## 2018-06-14 NOTE — Telephone Encounter (Signed)
Patient is calling to report his weight from March 12th to March 19th   MARCH 12 - 214 MARCH 13-  210 MARCH 14 - 208 MARCH 15 - 206 MARCH 16-  206 MARCH 17 - 205 MARCH 18 - 205 MARCH 19 - 205

## 2018-06-14 NOTE — Telephone Encounter (Signed)
Very good response to change in fluid pills, would look to cut back some. Can he lower to 40mg  in AM and 20mg  in PM, he needs a BMET/Mg on Monday. Call us again Monday to update [place] on labs   Zandra Abts MD

## 2018-06-15 NOTE — Telephone Encounter (Signed)
Pt voiced understanding - will come by Monday to pick up lab orders and will do at [facility] - updated medication list

## 2018-06-18 NOTE — Telephone Encounter (Signed)
Pt says doesn't want to go to hospital for labs - will try Dr Trena Platt office or [facility] and let us know

## 2018-06-18 NOTE — Telephone Encounter (Signed)
Weight today 107  Did take a pill and half Friday, Saturday and Sunday

## 2018-06-19 DIAGNOSIS — I2510 Atherosclerotic heart disease of native coronary artery without angina pectoris: Secondary | ICD-10-CM | POA: Diagnosis not present

## 2018-06-19 DIAGNOSIS — Z6832 Body mass index (BMI) 32.0-32.9, adult: Secondary | ICD-10-CM | POA: Diagnosis not present

## 2018-06-19 DIAGNOSIS — I509 Heart failure, unspecified: Secondary | ICD-10-CM | POA: Diagnosis not present

## 2018-06-19 DIAGNOSIS — Z299 Encounter for prophylactic measures, unspecified: Secondary | ICD-10-CM | POA: Diagnosis not present

## 2018-06-19 DIAGNOSIS — E1165 Type 2 diabetes mellitus with hyperglycemia: Secondary | ICD-10-CM | POA: Diagnosis not present

## 2018-06-19 DIAGNOSIS — Z87891 Personal history of nicotine dependence: Secondary | ICD-10-CM | POA: Diagnosis not present

## 2018-06-19 DIAGNOSIS — I10 Essential (primary) hypertension: Secondary | ICD-10-CM | POA: Diagnosis not present

## 2018-06-19 DIAGNOSIS — Z89439 Acquired absence of unspecified foot: Secondary | ICD-10-CM | POA: Diagnosis not present

## 2018-06-19 NOTE — Telephone Encounter (Signed)
That is fine, can we follow up with him to see if he was able to have them done?   Zandra Abts MD

## 2018-06-20 ENCOUNTER — Encounter: Payer: Self-pay | Admitting: *Deleted

## 2018-06-20 NOTE — Telephone Encounter (Signed)
Pt went yesterday and had labs done at Dr Janyth Pupa office - will request

## 2018-06-27 ENCOUNTER — Telehealth: Payer: Self-pay | Admitting: *Deleted

## 2018-06-27 NOTE — Telephone Encounter (Signed)
-----   Message from Arnoldo Lenis, MD sent at 06/25/2018 11:10 AM EDT ----- Labs look good, can he update [place] on his weights   Zandra Abts MD

## 2018-06-27 NOTE — Telephone Encounter (Signed)
Very good, update [place] again on his weights on Monday please   Zandra Abts MD

## 2018-06-27 NOTE — Telephone Encounter (Signed)
Pt aware - weight yesterday was 201lbs today is 202lbs. Pt denies any symptoms or complaints at this time

## 2018-06-27 NOTE — Telephone Encounter (Signed)
Pt aware and will update us on Monday  

## 2018-07-02 ENCOUNTER — Telehealth: Payer: Self-pay | Admitting: Cardiology

## 2018-07-02 NOTE — Telephone Encounter (Signed)
Patient calling to report weight per Dr.Branch. patient's weight is 202 lbs. / tg

## 2018-07-02 NOTE — Telephone Encounter (Signed)
Pt voiced understanding and will update [place] next week

## 2018-07-02 NOTE — Telephone Encounter (Signed)
Weight has leveled off around 202 lbs which is great. Continue current lasix, have him update [place] one more time in a week on his weight   Zandra Abts MD

## 2018-07-06 DIAGNOSIS — I96 Gangrene, not elsewhere classified: Secondary | ICD-10-CM | POA: Diagnosis not present

## 2018-07-06 DIAGNOSIS — I5032 Chronic diastolic (congestive) heart failure: Secondary | ICD-10-CM | POA: Diagnosis not present

## 2018-07-06 DIAGNOSIS — Z89412 Acquired absence of left great toe: Secondary | ICD-10-CM | POA: Diagnosis not present

## 2018-07-06 DIAGNOSIS — Z89411 Acquired absence of right great toe: Secondary | ICD-10-CM | POA: Diagnosis not present

## 2018-07-09 ENCOUNTER — Telehealth: Payer: Self-pay | Admitting: Cardiology

## 2018-07-09 NOTE — Telephone Encounter (Signed)
Patient called to report his weight for 07/09/2018.  202 lbs

## 2018-07-09 NOTE — Telephone Encounter (Signed)
His weight is unchanged from last report

## 2018-07-20 DIAGNOSIS — Z299 Encounter for prophylactic measures, unspecified: Secondary | ICD-10-CM | POA: Diagnosis not present

## 2018-07-20 DIAGNOSIS — Z6832 Body mass index (BMI) 32.0-32.9, adult: Secondary | ICD-10-CM | POA: Diagnosis not present

## 2018-07-20 DIAGNOSIS — I2510 Atherosclerotic heart disease of native coronary artery without angina pectoris: Secondary | ICD-10-CM | POA: Diagnosis not present

## 2018-07-20 DIAGNOSIS — I10 Essential (primary) hypertension: Secondary | ICD-10-CM | POA: Diagnosis not present

## 2018-07-20 DIAGNOSIS — E1165 Type 2 diabetes mellitus with hyperglycemia: Secondary | ICD-10-CM | POA: Diagnosis not present

## 2018-07-20 DIAGNOSIS — I509 Heart failure, unspecified: Secondary | ICD-10-CM | POA: Diagnosis not present

## 2018-07-25 ENCOUNTER — Telehealth: Payer: Self-pay | Admitting: *Deleted

## 2018-07-25 NOTE — Telephone Encounter (Signed)
Patient verbally consented for telehealth visits with [facility] and understands that his insurance company will be billed for the encounter. Able to check HR and weight.

## 2018-07-27 ENCOUNTER — Telehealth (INDEPENDENT_AMBULATORY_CARE_PROVIDER_SITE_OTHER): Payer: Medicare HMO | Admitting: Cardiology

## 2018-07-27 ENCOUNTER — Encounter: Payer: Self-pay | Admitting: Cardiology

## 2018-07-27 VITALS — HR 72 | Ht 67.0 in | Wt 202.0 lb

## 2018-07-27 DIAGNOSIS — I5032 Chronic diastolic (congestive) heart failure: Secondary | ICD-10-CM

## 2018-07-27 DIAGNOSIS — I2510 Atherosclerotic heart disease of native coronary artery without angina pectoris: Secondary | ICD-10-CM | POA: Diagnosis not present

## 2018-07-27 NOTE — Progress Notes (Signed)
Virtual Visit via Telephone Note   This visit type was conducted due to national recommendations for restrictions regarding the COVID-19 Pandemic (e.g. social distancing) in an effort to limit this patient's exposure and mitigate transmission in our community.  Due to his co-morbid illnesses, this patient is at least at moderate risk for complications without adequate follow up.  This format is felt to be most appropriate for this patient at this time.  The patient did not have access to video technology/had technical difficulties with video requiring transitioning to audio format only (telephone).  All issues noted in this document were discussed and addressed.  No physical exam could be performed with this format.  Please refer to the patient's chart for his  consent to telehealth for [facility].   Date:  07/27/2018   ID:  Mike Wright, DOB 09-Feb-1934, MRN [id number]  Patient Location: Home Provider Location: Home  PCP:  Monico Blitz, MD  Cardiologist:  Carlyle Dolly, MD  Electrophysiologist:  None   Evaluation Performed:  Follow-Up Visit  Chief Complaint:  Leg edema  History of Present Illness:    Mike Wright is a 85 y.o. male seen today for focused visit on recent issues with diastolic HF.    1. CAD - nonobstructive CAD by cath Jan 2012, LVEF 60-65% by LV gram.  - 06/2015 nuclear stress without clear ischemia - 05/2015 echo LVEF 65-70%, no WMAs, cannot evaluate diastolic function 11/6281 lexiscan without ischemia, low risk study.  -cath 04/2017 as reported below. Received DES to 75% D1, DES to 75% mid to distal LAD, DES to 70% prox LAD RHC with CI 2.67, mean PA 25, no wedge reported by LVEDP 14.  - discharged on triple therapy with ASA, plavix, eliquis with plan for 30 days, then stop ASA.   - 11/22/17 admission with chest pain, anemia. Negative workup for ACS. Symptomsresolved with blood transfusion - 11/2017 admit with chest pain and dyspnea. ACS work  negative, CT PE negative. Diuresed 6L with resolustion of symptoms. CXR with rib fracture thought playing a role in chest pain.    - last visit reported some chest pain. Dull pain midchest, 8/10 in severity. +SOB which is increasing. Not positional. Better with NG. -chest pain with increase frequency, stable severity - increased DOE with activity, room to room at home.  - some abdominal distension, increased orthopnea.  - compliant with meds.    - chest pain much improved since last visit, similar to prior symptoms has improved with diuresis.     2. Chronic diastolic HF  - weight up from 205 in 04/2018 to 218 last visit. Increased SOB/DOE - lasix was changed to 40mg  bid. Weight trended down, by last update 07/09/2018 he was down to 202 lbs.  - weights stable at 202 lbs. Still some LE edema though improved. No recent SOB or DOE.       The patient does not have symptoms concerning for COVID-19 infection (fever, chills, cough, or new shortness of breath).    Past Medical History:  Diagnosis Date  . Anemia    a. mild, noted 04/2017.  [name] CAD in native artery    a. [name] 04/2017 s/p DES to D1, DES to prox-mid LAD, DES to prox LAD overlapping the prior stent, LVEF 55-65%.   . Chronic diastolic CHF (congestive heart failure) ([place])   . Diabetic ulcer of toe ([place])   . DJD (degenerative joint disease) of cervical spine   . Essential hypertension, benign   .  GERD (gastroesophageal reflux disease)   . History of hiatal hernia   . HIT (heparin-induced thrombocytopenia) ([place])   . Hypothyroidism   . Hypoxia    a. went home on home O2 04/2017.  [name] Insomnia   . Mixed hyperlipidemia   . PAD (peripheral artery disease) ([place])   . PAF (paroxysmal atrial fibrillation) ([place])   . PVD (peripheral vascular disease) ([place])   . Renal insufficiency   . 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  . Sinus bradycardia    a. HR 30s-40s in 04/2017 -> diltiazem stopped, metoprolol reduced.  . Sleep  apnea    "chose not to order CPAP at this time" (05/18/2017)  . Type 2 diabetes mellitus ([place])   . Wheezing    a. suspected COPD 04/2017. Former tobacco x 40 years.   Past Surgical History:  Procedure Laterality Date  . ABDOMINAL AORTOGRAM W/LOWER EXTREMITY N/A 09/15/2017   Procedure: ABDOMINAL AORTOGRAM W/LOWER EXTREMITY;  Surgeon: Serafina Mitchell, MD;  Location: [place] [facility];  Service: Cardiovascular;  Laterality: N/A;  . AMPUTATION Bilateral 09/20/2017   Procedure: BILATERAL GREAT TOE AMPUTATIONS INCLUDING METATARSAL HEADS;  Surgeon: Serafina Mitchell, MD;  Location: [facility];  Service: Vascular;  Laterality: Bilateral;  . ANGIOPLASTY Right 09/20/2017   Procedure: ANGIOPLASTY RIGHT TIBIAL ARTERY;  Surgeon: Serafina Mitchell, MD;  Location: [place];  Service: Vascular;  Laterality: Right;  . APPENDECTOMY    . BACK SURGERY    . CARDIAC CATHETERIZATION  1980s; 2012;  . CATARACT EXTRACTION Left 2004  . COLONOSCOPY  2004   FLEISHMAN TICS  . CORONARY ANGIOPLASTY WITH STENT PLACEMENT  05/18/2017   "3 stents"  . CORONARY STENT INTERVENTION N/A 05/18/2017   Procedure: CORONARY STENT INTERVENTION;  Surgeon: Jettie Booze, MD;  Location: [place] [facility];  Service: Cardiovascular;  Laterality: N/A;  . ESOPHAGOGASTRODUODENOSCOPY (EGD) WITH PROPOFOL N/A 11/20/2017   Procedure: ESOPHAGOGASTRODUODENOSCOPY (EGD) WITH PROPOFOL;  Surgeon: Irene Shipper, MD;  Location: [place];  Service: Gastroenterology;  Laterality: N/A;  . I&D EXTREMITY Right 12/17/2017   Procedure: IRRIGATION AND DEBRIDEMENT RIGHT GREAT TOE;  Surgeon: Serafina Mitchell, MD;  Location: [facility];  Service: Vascular;  Laterality: Right;  . JOINT REPLACEMENT    . LEFT HEART CATH AND CORONARY ANGIOGRAPHY N/A 05/18/2017   Procedure: LEFT HEART CATH AND CORONARY ANGIOGRAPHY;  Surgeon: Jettie Booze, MD;  Location: [place] [facility];  Service: Cardiovascular;  Laterality: N/A;  . LOWER EXTREMITY ANGIOGRAM Right 09/20/2017    Procedure: RIGHT LOWER LEG  ANGIOGRAM;  Surgeon: Serafina Mitchell, MD;  Location: [place];  Service: Vascular;  Laterality: Right;  . LUMBAR FUSION  2002   L3, 4 L4, 5 L5 S1 Fused by Dr. Glenna Fellows  . PERIPHERAL VASCULAR BALLOON ANGIOPLASTY Left 09/15/2017   Procedure: PERIPHERAL VASCULAR BALLOON ANGIOPLASTY;  Surgeon: Serafina Mitchell, MD;  Location: [place] [facility];  Service: Cardiovascular;  Laterality: Left;  PTA of Peroneal & Posterior Tibial  . POSTERIOR LUMBAR FUSION    . RHINOPLASTY    . RIGHT HEART CATH N/A 05/18/2017   Procedure: RIGHT HEART CATH;  Surgeon: Jettie Booze, MD;  Location: [place] [facility];  Service: Cardiovascular;  Laterality: N/A;  . TEE WITHOUT CARDIOVERSION N/A 09/18/2017   Procedure: TRANSESOPHAGEAL ECHOCARDIOGRAM (TEE);  Surgeon: Fay Records, MD;  Location: [place];  Service: Cardiovascular;  Laterality: N/A;  . TOTAL KNEE ARTHROPLASTY Bilateral   . TRANSURETHRAL RESECTION OF  PROSTATE  2001   [place]     No outpatient medications have been marked as taking for the 07/27/18 encounter (Appointment) with Arnoldo Lenis, MD.     Allergies:   Bactrim [sulfamethoxazole-trimethoprim]; Codeine; Heparin; Losartan; Other; and Oxycodone   Social History   Tobacco Use  . Smoking status: Former Smoker    Packs/day: 2.00    Years: 30.00    Pack years: 60.00    Types: Cigarettes    Start date: 03/28/1953    Last attempt to quit: 03/29/1983    Years since quitting: 35.3  . Smokeless tobacco: Never Used  Substance Use Topics  . Alcohol use: Not Currently    Comment: 05/18/2017 'I drink a beer q yr"  . Drug use: No     Family Hx: The patient's family history includes COPD in his father and mother; CVA in his sister; Diabetes in his mother and sister; Heart attack (age of onset: 48) in his father; Heart disease in his mother; Hypertension in his sister; Multiple sclerosis in his sister.  ROS:   Please see the history of present illness.     All other  systems reviewed and are negative.   Prior CV studies:   The following studies were reviewed today:  05/2015 echo Study Conclusions  - Left ventricle: The cavity size was normal. Wall thickness was increased increased in a pattern of mild to moderate LVH. Systolic function was vigorous. The estimated ejection fraction was in the range of 65% to 70%. Wall motion was normal; there were no regional wall motion abnormalities. The study is not technically sufficient to allow evaluation of LV diastolic function. - Aortic valve: Moderately calcified annulus. Mildly thickened leaflets. There was mild stenosis. There was mild regurgitation. Mean gradient (S): 13 mm Hg. Valve area (VTI): 1.51 cm^2. - Mitral valve: Mildly calcified annulus. Normal thickness leaflets . - Left atrium: The atrium was moderately dilated. - Technically adequate study.   06/2015 Nuclear stress test  No diagnostic ST segment changes to indicate ischemia.  Small, mild intensity, perfusion defects noted in the apical anterior and basal inferolateral walls. This is most consistent with soft tissue attenuation given normal wall motion in these regions. No large ischemic zones noted.  This is a low risk study.  Nuclear stress EF: 64%.  05/2016 Event monitor  Rhythm is atrial fibrillation throughout study. Occasioanal PVCs  Min HR 51, Max HR 107, Avg HR 67  Reported symptoms correspond with rate controlled atrial fibrillation.  09/2016 nuclear stress  There was no ST segment deviation noted during stress.  The study is normal. There are no perfusion defects consistent with prior infarct or current ischemia.  This is a low risk study.  The left ventricular ejection fraction is normal (55-65%).   04/2017 cath  Dist RCA lesion is 40% stenosed.  Prox RCA lesion is 25% stenosed.  1st Diag lesion is 75% stenosed.  A drug-eluting stent was successfully placed using a STENT SYNERGY  DES 2.25X12.  Post intervention, there is a 0% residual stenosis.  Prox LAD to Mid LAD lesion is 75% stenosed.  A drug-eluting stent was successfully placed using a STENT SYNERGY DES 3.5X38.  Post intervention, there is a 0% residual stenosis.  Prox LAD lesion is 70% stenosed.  A drug-eluting stent was successfully placed using a STENT SYNERGY DES 4X12, overlapping the prior stent.  Post intervention, there is a 0% residual stenosis.  The left ventricular systolic function is normal.  The left ventricular  ejection fraction is 55-65% by visual estimate.  LV end diastolic pressure is normal.  There is no aortic valve stenosis.  Ao sat 98%, PA sat 65%; PA mean 25 mm Hg; unable to wedge the catheter.  Normal right heat pressures.  Tortuous right subclavian making catheter navigation difficult from the right radial approach.  Continue dual antiplatelet therapy along with Eliquis for 30 days.  Restart Eliquis tomorrow.  After 30 days, stop aspirin. Continue Plavix and Eliquis.   After 6 months, can consider stopping clopidogrel if he has bleeding issues. Given the nomber of stents, would try to complete one year of clopidogrel if no bleeding issues.  Labs/Other Tests and Data Reviewed:    EKG:  na  Recent Labs: 12/02/2017: B Natriuretic Peptide 180.0; TSH 1.559 01/04/2018: ALT 22; BUN 17; Creatinine 1.00; Hemoglobin 11.5; Platelets 208; Potassium 4.6; Sodium 141   Recent Lipid Panel Lab Results  Component Value Date/Time   CHOL 65 11/18/2017 04:21 AM   TRIG 85 11/18/2017 04:21 AM   HDL 16 (L) 11/18/2017 04:21 AM   CHOLHDL 4.1 11/18/2017 04:21 AM   LDLCALC 32 11/18/2017 04:21 AM    Wt Readings from Last 3 Encounters:  06/07/18 218 lb (98.9 kg)  05/14/18 205 lb (93 kg)  04/04/18 209 lb 12.8 oz (95.2 kg)     Objective:    Vital Signs:   Today's Vitals   07/27/18 1510  Pulse: 72  Weight: 202 lb (91.6 kg)  Height: 5\' 7"  (1.702 m)   Body mass index is  31.64 kg/m.  Normal affect. Normal speech pattern and tone. Appears comforable, no apparent distress. No audible signs of SOB or wheezing.   ASSESSMENT & PLAN:    1.CAD -PCI as reported above 04/2017 -recent chest pain resolved with diuresis, he has had this pattern before - continue current meds   2. Chronic diastolic HF - weight back down with diuretic change, 202 lbs appears to be a good weight for him - edema has improved, breathing has improved - continue current meds   3. Preop evaluation - received request from urology for clearance for circumcision.  - now that he is euvolemic would be ok to proceed  COVID-19 Education: The signs and symptoms of COVID-19 were discussed with the patient and how to seek care for testing (follow up with PCP or arrange E-visit).  The importance of social distancing was discussed today.  Time:   Today, I have spent 13 minutes with the patient with telehealth technology discussing the above problems.     Medication Adjustments/Labs and Tests Ordered: Current medicines are reviewed at length with the patient today.  Concerns regarding medicines are outlined above.   Tests Ordered: No orders of the defined types were placed in this encounter.   Medication Changes: No orders of the defined types were placed in this encounter.   Disposition:  Follow up 3 months  Signed, Carlyle Dolly, MD  07/27/2018 8:44 AM    [facility]

## 2018-07-27 NOTE — Patient Instructions (Signed)
Your physician recommends that you schedule a follow-up appointment in: 3 MONTHS WITH DR BRANCH  Your physician recommends that you continue on your current medications as directed. Please refer to the Current Medication list given to you today.  Thank you for choosing [facility]!!    

## 2018-08-05 DIAGNOSIS — Z89412 Acquired absence of left great toe: Secondary | ICD-10-CM | POA: Diagnosis not present

## 2018-08-05 DIAGNOSIS — I96 Gangrene, not elsewhere classified: Secondary | ICD-10-CM | POA: Diagnosis not present

## 2018-08-05 DIAGNOSIS — Z89411 Acquired absence of right great toe: Secondary | ICD-10-CM | POA: Diagnosis not present

## 2018-08-05 DIAGNOSIS — I5032 Chronic diastolic (congestive) heart failure: Secondary | ICD-10-CM | POA: Diagnosis not present

## 2018-08-21 DIAGNOSIS — Z89412 Acquired absence of left great toe: Secondary | ICD-10-CM | POA: Diagnosis not present

## 2018-08-21 DIAGNOSIS — E1142 Type 2 diabetes mellitus with diabetic polyneuropathy: Secondary | ICD-10-CM | POA: Diagnosis not present

## 2018-08-21 DIAGNOSIS — Z89411 Acquired absence of right great toe: Secondary | ICD-10-CM | POA: Diagnosis not present

## 2018-08-21 DIAGNOSIS — G620 Drug-induced polyneuropathy: Secondary | ICD-10-CM | POA: Diagnosis not present

## 2018-08-21 DIAGNOSIS — Z9981 Dependence on supplemental oxygen: Secondary | ICD-10-CM | POA: Diagnosis not present

## 2018-08-21 DIAGNOSIS — T465X5D Adverse effect of other antihypertensive drugs, subsequent encounter: Secondary | ICD-10-CM | POA: Diagnosis not present

## 2018-08-21 DIAGNOSIS — J9611 Chronic respiratory failure with hypoxia: Secondary | ICD-10-CM | POA: Diagnosis not present

## 2018-08-21 DIAGNOSIS — Z794 Long term (current) use of insulin: Secondary | ICD-10-CM | POA: Diagnosis not present

## 2018-09-05 DIAGNOSIS — I96 Gangrene, not elsewhere classified: Secondary | ICD-10-CM | POA: Diagnosis not present

## 2018-09-05 DIAGNOSIS — I5032 Chronic diastolic (congestive) heart failure: Secondary | ICD-10-CM | POA: Diagnosis not present

## 2018-09-05 DIAGNOSIS — Z89412 Acquired absence of left great toe: Secondary | ICD-10-CM | POA: Diagnosis not present

## 2018-09-05 DIAGNOSIS — Z89411 Acquired absence of right great toe: Secondary | ICD-10-CM | POA: Diagnosis not present

## 2018-09-07 DIAGNOSIS — Z299 Encounter for prophylactic measures, unspecified: Secondary | ICD-10-CM | POA: Diagnosis not present

## 2018-09-07 DIAGNOSIS — Z6832 Body mass index (BMI) 32.0-32.9, adult: Secondary | ICD-10-CM | POA: Diagnosis not present

## 2018-09-07 DIAGNOSIS — I509 Heart failure, unspecified: Secondary | ICD-10-CM | POA: Diagnosis not present

## 2018-09-07 DIAGNOSIS — R197 Diarrhea, unspecified: Secondary | ICD-10-CM | POA: Diagnosis not present

## 2018-09-07 DIAGNOSIS — I10 Essential (primary) hypertension: Secondary | ICD-10-CM | POA: Diagnosis not present

## 2018-09-10 DIAGNOSIS — R197 Diarrhea, unspecified: Secondary | ICD-10-CM | POA: Diagnosis not present

## 2018-09-21 DIAGNOSIS — Z1211 Encounter for screening for malignant neoplasm of colon: Secondary | ICD-10-CM | POA: Diagnosis not present

## 2018-09-21 DIAGNOSIS — E7800 Pure hypercholesterolemia, unspecified: Secondary | ICD-10-CM | POA: Diagnosis not present

## 2018-09-21 DIAGNOSIS — Z0000 Encounter for general adult medical examination without abnormal findings: Secondary | ICD-10-CM | POA: Diagnosis not present

## 2018-09-21 DIAGNOSIS — Z125 Encounter for screening for malignant neoplasm of prostate: Secondary | ICD-10-CM | POA: Diagnosis not present

## 2018-09-21 DIAGNOSIS — I509 Heart failure, unspecified: Secondary | ICD-10-CM | POA: Diagnosis not present

## 2018-09-21 DIAGNOSIS — Z7189 Other specified counseling: Secondary | ICD-10-CM | POA: Diagnosis not present

## 2018-09-21 DIAGNOSIS — Z1331 Encounter for screening for depression: Secondary | ICD-10-CM | POA: Diagnosis not present

## 2018-09-21 DIAGNOSIS — I5032 Chronic diastolic (congestive) heart failure: Secondary | ICD-10-CM | POA: Diagnosis not present

## 2018-09-21 DIAGNOSIS — R0602 Shortness of breath: Secondary | ICD-10-CM | POA: Diagnosis not present

## 2018-09-21 DIAGNOSIS — R5383 Other fatigue: Secondary | ICD-10-CM | POA: Diagnosis not present

## 2018-09-21 DIAGNOSIS — Z1339 Encounter for screening examination for other mental health and behavioral disorders: Secondary | ICD-10-CM | POA: Diagnosis not present

## 2018-09-21 DIAGNOSIS — Z79899 Other long term (current) drug therapy: Secondary | ICD-10-CM | POA: Diagnosis not present

## 2018-09-24 DIAGNOSIS — R0602 Shortness of breath: Secondary | ICD-10-CM | POA: Diagnosis not present

## 2018-09-24 DIAGNOSIS — I2510 Atherosclerotic heart disease of native coronary artery without angina pectoris: Secondary | ICD-10-CM | POA: Diagnosis not present

## 2018-09-24 DIAGNOSIS — I5030 Unspecified diastolic (congestive) heart failure: Secondary | ICD-10-CM | POA: Diagnosis not present

## 2018-10-05 DIAGNOSIS — Z89412 Acquired absence of left great toe: Secondary | ICD-10-CM | POA: Diagnosis not present

## 2018-10-05 DIAGNOSIS — I5032 Chronic diastolic (congestive) heart failure: Secondary | ICD-10-CM | POA: Diagnosis not present

## 2018-10-05 DIAGNOSIS — Z89411 Acquired absence of right great toe: Secondary | ICD-10-CM | POA: Diagnosis not present

## 2018-10-05 DIAGNOSIS — I96 Gangrene, not elsewhere classified: Secondary | ICD-10-CM | POA: Diagnosis not present

## 2018-10-10 DIAGNOSIS — J449 Chronic obstructive pulmonary disease, unspecified: Secondary | ICD-10-CM | POA: Diagnosis not present

## 2018-10-10 DIAGNOSIS — J9611 Chronic respiratory failure with hypoxia: Secondary | ICD-10-CM | POA: Diagnosis not present

## 2018-10-10 DIAGNOSIS — I509 Heart failure, unspecified: Secondary | ICD-10-CM | POA: Diagnosis not present

## 2018-10-10 DIAGNOSIS — I4891 Unspecified atrial fibrillation: Secondary | ICD-10-CM | POA: Diagnosis not present

## 2018-10-10 DIAGNOSIS — Z794 Long term (current) use of insulin: Secondary | ICD-10-CM | POA: Diagnosis not present

## 2018-10-10 DIAGNOSIS — D6869 Other thrombophilia: Secondary | ICD-10-CM | POA: Diagnosis not present

## 2018-10-10 DIAGNOSIS — I110 Hypertensive heart disease with heart failure: Secondary | ICD-10-CM | POA: Diagnosis not present

## 2018-10-10 DIAGNOSIS — Z9981 Dependence on supplemental oxygen: Secondary | ICD-10-CM | POA: Diagnosis not present

## 2018-10-10 DIAGNOSIS — I25118 Atherosclerotic heart disease of native coronary artery with other forms of angina pectoris: Secondary | ICD-10-CM | POA: Diagnosis not present

## 2018-10-10 DIAGNOSIS — E1159 Type 2 diabetes mellitus with other circulatory complications: Secondary | ICD-10-CM | POA: Diagnosis not present

## 2018-10-22 DIAGNOSIS — R0602 Shortness of breath: Secondary | ICD-10-CM | POA: Diagnosis not present

## 2018-10-22 DIAGNOSIS — I5032 Chronic diastolic (congestive) heart failure: Secondary | ICD-10-CM | POA: Diagnosis not present

## 2018-10-22 DIAGNOSIS — Z6831 Body mass index (BMI) 31.0-31.9, adult: Secondary | ICD-10-CM | POA: Diagnosis not present

## 2018-10-22 DIAGNOSIS — E1151 Type 2 diabetes mellitus with diabetic peripheral angiopathy without gangrene: Secondary | ICD-10-CM | POA: Diagnosis not present

## 2018-10-22 DIAGNOSIS — E1165 Type 2 diabetes mellitus with hyperglycemia: Secondary | ICD-10-CM | POA: Diagnosis not present

## 2018-10-22 DIAGNOSIS — I739 Peripheral vascular disease, unspecified: Secondary | ICD-10-CM | POA: Diagnosis not present

## 2018-10-22 DIAGNOSIS — I10 Essential (primary) hypertension: Secondary | ICD-10-CM | POA: Diagnosis not present

## 2018-10-22 DIAGNOSIS — Z299 Encounter for prophylactic measures, unspecified: Secondary | ICD-10-CM | POA: Diagnosis not present

## 2018-10-31 DIAGNOSIS — R197 Diarrhea, unspecified: Secondary | ICD-10-CM | POA: Diagnosis not present

## 2018-11-05 DIAGNOSIS — E1151 Type 2 diabetes mellitus with diabetic peripheral angiopathy without gangrene: Secondary | ICD-10-CM | POA: Diagnosis not present

## 2018-11-05 DIAGNOSIS — Z87891 Personal history of nicotine dependence: Secondary | ICD-10-CM | POA: Diagnosis not present

## 2018-11-05 DIAGNOSIS — Z6832 Body mass index (BMI) 32.0-32.9, adult: Secondary | ICD-10-CM | POA: Diagnosis not present

## 2018-11-05 DIAGNOSIS — J449 Chronic obstructive pulmonary disease, unspecified: Secondary | ICD-10-CM | POA: Diagnosis not present

## 2018-11-05 DIAGNOSIS — R197 Diarrhea, unspecified: Secondary | ICD-10-CM | POA: Diagnosis not present

## 2018-11-05 DIAGNOSIS — I739 Peripheral vascular disease, unspecified: Secondary | ICD-10-CM | POA: Diagnosis not present

## 2018-11-05 DIAGNOSIS — D649 Anemia, unspecified: Secondary | ICD-10-CM | POA: Diagnosis not present

## 2018-11-05 DIAGNOSIS — E1165 Type 2 diabetes mellitus with hyperglycemia: Secondary | ICD-10-CM | POA: Diagnosis not present

## 2018-11-05 DIAGNOSIS — R0602 Shortness of breath: Secondary | ICD-10-CM | POA: Diagnosis not present

## 2018-11-05 DIAGNOSIS — I5033 Acute on chronic diastolic (congestive) heart failure: Secondary | ICD-10-CM | POA: Diagnosis not present

## 2018-11-05 DIAGNOSIS — I2510 Atherosclerotic heart disease of native coronary artery without angina pectoris: Secondary | ICD-10-CM | POA: Diagnosis not present

## 2018-11-05 DIAGNOSIS — K219 Gastro-esophageal reflux disease without esophagitis: Secondary | ICD-10-CM | POA: Diagnosis not present

## 2018-11-05 DIAGNOSIS — I96 Gangrene, not elsewhere classified: Secondary | ICD-10-CM | POA: Diagnosis not present

## 2018-11-05 DIAGNOSIS — Z7902 Long term (current) use of antithrombotics/antiplatelets: Secondary | ICD-10-CM | POA: Diagnosis not present

## 2018-11-05 DIAGNOSIS — Z299 Encounter for prophylactic measures, unspecified: Secondary | ICD-10-CM | POA: Diagnosis not present

## 2018-11-05 DIAGNOSIS — E119 Type 2 diabetes mellitus without complications: Secondary | ICD-10-CM | POA: Diagnosis not present

## 2018-11-05 DIAGNOSIS — I110 Hypertensive heart disease with heart failure: Secondary | ICD-10-CM | POA: Diagnosis not present

## 2018-11-05 DIAGNOSIS — Z794 Long term (current) use of insulin: Secondary | ICD-10-CM | POA: Diagnosis not present

## 2018-11-05 DIAGNOSIS — I5032 Chronic diastolic (congestive) heart failure: Secondary | ICD-10-CM | POA: Diagnosis not present

## 2018-11-05 DIAGNOSIS — Z89412 Acquired absence of left great toe: Secondary | ICD-10-CM | POA: Diagnosis not present

## 2018-11-05 DIAGNOSIS — I10 Essential (primary) hypertension: Secondary | ICD-10-CM | POA: Diagnosis not present

## 2018-11-05 DIAGNOSIS — Z89411 Acquired absence of right great toe: Secondary | ICD-10-CM | POA: Diagnosis not present

## 2018-11-06 DIAGNOSIS — I5033 Acute on chronic diastolic (congestive) heart failure: Secondary | ICD-10-CM | POA: Diagnosis not present

## 2018-11-06 DIAGNOSIS — D649 Anemia, unspecified: Secondary | ICD-10-CM | POA: Diagnosis not present

## 2018-11-06 DIAGNOSIS — E119 Type 2 diabetes mellitus without complications: Secondary | ICD-10-CM | POA: Diagnosis not present

## 2018-11-06 DIAGNOSIS — I2510 Atherosclerotic heart disease of native coronary artery without angina pectoris: Secondary | ICD-10-CM | POA: Diagnosis not present

## 2018-11-07 ENCOUNTER — Telehealth: Payer: Medicare HMO | Admitting: Cardiology

## 2018-11-07 DIAGNOSIS — I5033 Acute on chronic diastolic (congestive) heart failure: Secondary | ICD-10-CM | POA: Diagnosis not present

## 2018-11-07 DIAGNOSIS — E119 Type 2 diabetes mellitus without complications: Secondary | ICD-10-CM | POA: Diagnosis not present

## 2018-11-07 DIAGNOSIS — D649 Anemia, unspecified: Secondary | ICD-10-CM | POA: Diagnosis not present

## 2018-11-07 DIAGNOSIS — I2510 Atherosclerotic heart disease of native coronary artery without angina pectoris: Secondary | ICD-10-CM | POA: Diagnosis not present

## 2018-11-14 DIAGNOSIS — D649 Anemia, unspecified: Secondary | ICD-10-CM | POA: Diagnosis not present

## 2018-11-14 DIAGNOSIS — I739 Peripheral vascular disease, unspecified: Secondary | ICD-10-CM | POA: Diagnosis not present

## 2018-11-14 DIAGNOSIS — Z6830 Body mass index (BMI) 30.0-30.9, adult: Secondary | ICD-10-CM | POA: Diagnosis not present

## 2018-11-14 DIAGNOSIS — E1151 Type 2 diabetes mellitus with diabetic peripheral angiopathy without gangrene: Secondary | ICD-10-CM | POA: Diagnosis not present

## 2018-11-14 DIAGNOSIS — Z299 Encounter for prophylactic measures, unspecified: Secondary | ICD-10-CM | POA: Diagnosis not present

## 2018-11-14 DIAGNOSIS — I5032 Chronic diastolic (congestive) heart failure: Secondary | ICD-10-CM | POA: Diagnosis not present

## 2018-11-14 DIAGNOSIS — E1165 Type 2 diabetes mellitus with hyperglycemia: Secondary | ICD-10-CM | POA: Diagnosis not present

## 2018-11-14 DIAGNOSIS — I10 Essential (primary) hypertension: Secondary | ICD-10-CM | POA: Diagnosis not present

## 2018-11-18 DIAGNOSIS — I2510 Atherosclerotic heart disease of native coronary artery without angina pectoris: Secondary | ICD-10-CM | POA: Diagnosis not present

## 2018-11-18 DIAGNOSIS — Z794 Long term (current) use of insulin: Secondary | ICD-10-CM | POA: Diagnosis not present

## 2018-11-18 DIAGNOSIS — I482 Chronic atrial fibrillation, unspecified: Secondary | ICD-10-CM | POA: Diagnosis not present

## 2018-11-18 DIAGNOSIS — D500 Iron deficiency anemia secondary to blood loss (chronic): Secondary | ICD-10-CM | POA: Diagnosis not present

## 2018-11-18 DIAGNOSIS — Z7902 Long term (current) use of antithrombotics/antiplatelets: Secondary | ICD-10-CM | POA: Diagnosis not present

## 2018-11-18 DIAGNOSIS — E119 Type 2 diabetes mellitus without complications: Secondary | ICD-10-CM | POA: Diagnosis not present

## 2018-11-18 DIAGNOSIS — Z9981 Dependence on supplemental oxygen: Secondary | ICD-10-CM | POA: Diagnosis not present

## 2018-11-18 DIAGNOSIS — I5033 Acute on chronic diastolic (congestive) heart failure: Secondary | ICD-10-CM | POA: Diagnosis not present

## 2018-11-18 DIAGNOSIS — I110 Hypertensive heart disease with heart failure: Secondary | ICD-10-CM | POA: Diagnosis not present

## 2018-11-19 DIAGNOSIS — I2510 Atherosclerotic heart disease of native coronary artery without angina pectoris: Secondary | ICD-10-CM | POA: Diagnosis not present

## 2018-11-19 DIAGNOSIS — Z9981 Dependence on supplemental oxygen: Secondary | ICD-10-CM | POA: Diagnosis not present

## 2018-11-19 DIAGNOSIS — E119 Type 2 diabetes mellitus without complications: Secondary | ICD-10-CM | POA: Diagnosis not present

## 2018-11-19 DIAGNOSIS — I482 Chronic atrial fibrillation, unspecified: Secondary | ICD-10-CM | POA: Diagnosis not present

## 2018-11-19 DIAGNOSIS — D500 Iron deficiency anemia secondary to blood loss (chronic): Secondary | ICD-10-CM | POA: Diagnosis not present

## 2018-11-19 DIAGNOSIS — I110 Hypertensive heart disease with heart failure: Secondary | ICD-10-CM | POA: Diagnosis not present

## 2018-11-19 DIAGNOSIS — Z794 Long term (current) use of insulin: Secondary | ICD-10-CM | POA: Diagnosis not present

## 2018-11-19 DIAGNOSIS — Z7902 Long term (current) use of antithrombotics/antiplatelets: Secondary | ICD-10-CM | POA: Diagnosis not present

## 2018-11-19 DIAGNOSIS — I5033 Acute on chronic diastolic (congestive) heart failure: Secondary | ICD-10-CM | POA: Diagnosis not present

## 2018-11-22 DIAGNOSIS — D500 Iron deficiency anemia secondary to blood loss (chronic): Secondary | ICD-10-CM | POA: Diagnosis not present

## 2018-11-22 DIAGNOSIS — Z9981 Dependence on supplemental oxygen: Secondary | ICD-10-CM | POA: Diagnosis not present

## 2018-11-22 DIAGNOSIS — E119 Type 2 diabetes mellitus without complications: Secondary | ICD-10-CM | POA: Diagnosis not present

## 2018-11-22 DIAGNOSIS — I2510 Atherosclerotic heart disease of native coronary artery without angina pectoris: Secondary | ICD-10-CM | POA: Diagnosis not present

## 2018-11-22 DIAGNOSIS — Z794 Long term (current) use of insulin: Secondary | ICD-10-CM | POA: Diagnosis not present

## 2018-11-22 DIAGNOSIS — I110 Hypertensive heart disease with heart failure: Secondary | ICD-10-CM | POA: Diagnosis not present

## 2018-11-22 DIAGNOSIS — I482 Chronic atrial fibrillation, unspecified: Secondary | ICD-10-CM | POA: Diagnosis not present

## 2018-11-22 DIAGNOSIS — I5033 Acute on chronic diastolic (congestive) heart failure: Secondary | ICD-10-CM | POA: Diagnosis not present

## 2018-11-22 DIAGNOSIS — Z7902 Long term (current) use of antithrombotics/antiplatelets: Secondary | ICD-10-CM | POA: Diagnosis not present

## 2018-11-22 NOTE — Progress Notes (Addendum)
Subjective:    Patient ID: Mike Wright, male    DOB: June 15, 1933, 85 y.o.   MRN: [id number]  HPI [name] is an 85 year old male with a past medical history significant for coronary artery disease, coronary stent x 3 04/2017 on Eliquis and Plavix,  diastolic CHF, hypertension, paroxysmal atrial fibrillation, peripheral vascular disease, anemia, DM II, sleep apnea, GERD, renal insuffiicency and retinal hemorrhage. S/p bil hallux amputations 09/20/17. S/p right LE angiogram with balloon angioplasty of peroneal and posterior tibial  arteries 09/20/17.   He presents today with complaints of having abdominal pain and diarrhea for the past 2 months. He complains of having daily heartburn. He takes Protonix 40mg  once daily. He is also taking Tums 2 tabs po tid. He complains of having epigastric pain and central lower abdominal pain which he describes as a cramping like pain. No bloody diarrhea, no melena. He initially had 3 to 4 episodes of diarrhea daily, now having 1 or 2 episodes daily then skips one or two days without passing a BM. Infrequent solid stool, he reports passing 5 solids stools over the past month. He sometimes notices an oily discharge in the toilet water. He denies having any weight loss. No fever, sweats or chills. No recent antibiotics. No NSAID use. No hematuria. Decreased urinary flow. He was scheduled to have a circumcision, this surgery was canceled due to Covid pandemic. His wife is present.  He reported having dizziness, admitted to [facility] 11/05/2018 and discharged home 11/09/2018. He was found to be anemic. He received 2 units of PRBCs. He does recall having any abdominal imaging at that time. I will request a copy of his admission and discharge records for further review.   He continues to have daily dizziness. He fell at home one week ago and landed on his left arm which has a large area of ecchymosis. He fell again this morning while he reached for a  chair. He did not hit his head during either fall. No chest pain. He reports always being short of breath.  His heart rate is 40 b/min. BP 100/53. He has not taking his Metoprolol, Norvasc or Lasix yet today. He last took Eliquis last night and last dose of Plavis was yesterday morning.    Repeat labs done by his PCP [facility] 11/14/2018: WBC 6.3.  Hemoglobin 9.2.  Hematocrit 32.1.  MCV 78.  RDW 21.1.  Platelet 195.  Glucose 88.  BUN 17.  Creatinine 1.09.  GFR 62.  Sodium 143.  Potassium 4.5.  Chloride 99.  Carbon dioxide 26.  Calcium 9.3.  He is not taking po iron causes abdominal pain, frequent loose stools  He was previously admitted to [facility] 10/2017 with chest pain and during this admission he was also found to be anemic with heme + stools.  An EGD was done by Dr. Scarlette Shorts on 11/20/2017 which was normal. A colonoscopy was not done. His last colonoscopy was in 2004 by Dr. Lindalou Hose, possible showed diverticulosis, he thought he possibly had a few colon polyps. He reports his oldest sister had 13 inches of her colon removed and she died at the age of 31. His other sister age 21 had colon surgery which required a colostomy and she lives at a SNF. He does not know if his sisters had colon cancer.  CT angiogram 11/21/2017: CT angiogram of the abdomen does not support chronic mesenteric ischemia as a source of the patient's postprandial abdominal pain.  Decreased perfusion of the left renal cortex at the superior and posterior aspect. This may reflect slow flow given the presence atherosclerosis, though the differential would include both renal infarction as well as renal infection/pyelonephritis. Correlation with lab values may be useful. There is no evidence of high-grade stenosis at the left renal artery origin. Mild atherosclerotic changes of the abdominal aorta with associated mild vascular disease of the bilateral iliac arteries. Incidental imaging of native coronary artery disease.  Circumferential urinary bladder wall thickening, potentially chronic thickening or related to cystitis. Moderate to large stool burden, may represent constipation. No evidence of bowel obstruction. Diverticular disease without evidence of acute diverticulitis.  Past Medical History:  Diagnosis Date  . Anemia    a. mild, noted 04/2017.  [name] CAD in native artery    a. [name] 04/2017 s/p DES to D1, DES to prox-mid LAD, DES to prox LAD overlapping the prior stent, LVEF 55-65%.   . Chronic diastolic CHF (congestive heart failure) ([place])   . Diabetic ulcer of toe ([place])   . DJD (degenerative joint disease) of cervical spine   . Essential hypertension, benign   . GERD (gastroesophageal reflux disease)   . History of hiatal hernia   . HIT (heparin-induced thrombocytopenia) ([place])   . Hypothyroidism   . Hypoxia    a. went home on home O2 04/2017.  [name] Insomnia   . Mixed hyperlipidemia   . PAD (peripheral artery disease) ([place])   . PAF (paroxysmal atrial fibrillation) ([place])   . PVD (peripheral vascular disease) ([place])   . Renal insufficiency   . 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  . Sinus bradycardia    a. HR 30s-40s in 04/2017 -> diltiazem stopped, metoprolol reduced.  . Sleep apnea    "chose not to order CPAP at this time" (05/18/2017)  . Type 2 diabetes mellitus ([place])   . Wheezing    a. suspected COPD 04/2017. Former tobacco x 40 years.   Past Surgical History:  Procedure Laterality Date  . ABDOMINAL AORTOGRAM W/LOWER EXTREMITY N/A 09/15/2017   Procedure: ABDOMINAL AORTOGRAM W/LOWER EXTREMITY;  Surgeon: Serafina Mitchell, MD;  Location: [facility];  Service: Cardiovascular;  Laterality: N/A;  . AMPUTATION Bilateral 09/20/2017   Procedure: BILATERAL GREAT TOE AMPUTATIONS INCLUDING METATARSAL HEADS;  Surgeon: Serafina Mitchell, MD;  Location: [facility];  Service: Vascular;  Laterality: Bilateral;  . ANGIOPLASTY Right 09/20/2017   Procedure: ANGIOPLASTY RIGHT TIBIAL ARTERY;  Surgeon:  Serafina Mitchell, MD;  Location: [facility];  Service: Vascular;  Laterality: Right;  . APPENDECTOMY    . BACK SURGERY    . CARDIAC CATHETERIZATION  1980s; 2012;  . CATARACT EXTRACTION Left 2004  . COLONOSCOPY  2004   FLEISHMAN TICS  . CORONARY ANGIOPLASTY WITH STENT PLACEMENT  05/18/2017   "3 stents"  . CORONARY STENT INTERVENTION N/A 05/18/2017   Procedure: CORONARY STENT INTERVENTION;  Surgeon: Jettie Booze, MD;  Location: [facility];  Service: Cardiovascular;  Laterality: N/A;  . ESOPHAGOGASTRODUODENOSCOPY (EGD) WITH PROPOFOL N/A 11/20/2017   Procedure: ESOPHAGOGASTRODUODENOSCOPY (EGD) WITH PROPOFOL;  Surgeon: Irene Shipper, MD;  Location: [facility];  Service: Gastroenterology;  Laterality: N/A;  . I&D EXTREMITY Right 12/17/2017   Procedure: IRRIGATION AND DEBRIDEMENT RIGHT GREAT TOE;  Surgeon: Serafina Mitchell, MD;  Location: [facility];  Service: Vascular;  Laterality: Right;  . JOINT REPLACEMENT    . LEFT HEART CATH AND CORONARY ANGIOGRAPHY N/A 05/18/2017   Procedure: LEFT HEART CATH  AND CORONARY ANGIOGRAPHY;  Surgeon: Jettie Booze, MD;  Location: [facility];  Service: Cardiovascular;  Laterality: N/A;  . LOWER EXTREMITY ANGIOGRAM Right 09/20/2017   Procedure: RIGHT LOWER LEG  ANGIOGRAM;  Surgeon: Serafina Mitchell, MD;  Location: [facility];  Service: Vascular;  Laterality: Right;  . LUMBAR FUSION  2002   L3, 4 L4, 5 L5 S1 Fused by Dr. Glenna Fellows  . PERIPHERAL VASCULAR BALLOON ANGIOPLASTY Left 09/15/2017   Procedure: PERIPHERAL VASCULAR BALLOON ANGIOPLASTY;  Surgeon: Serafina Mitchell, MD;  Location: [facility];  Service: Cardiovascular;  Laterality: Left;  PTA of Peroneal & Posterior Tibial  . POSTERIOR LUMBAR FUSION    . RHINOPLASTY    . RIGHT HEART CATH N/A 05/18/2017   Procedure: RIGHT HEART CATH;  Surgeon: Jettie Booze, MD;  Location: [facility];  Service: Cardiovascular;  Laterality: N/A;  . TEE WITHOUT CARDIOVERSION N/A 09/18/2017    Procedure: TRANSESOPHAGEAL ECHOCARDIOGRAM (TEE);  Surgeon: Fay Records, MD;  Location: [facility];  Service: Cardiovascular;  Laterality: N/A;  . TOTAL KNEE ARTHROPLASTY Bilateral   . TRANSURETHRAL RESECTION OF PROSTATE  2001   [place]   Current Outpatient Medications on File Prior to Visit  Medication Sig Dispense Refill  . acetaminophen (TYLENOL) 500 MG tablet Take 500 mg by mouth as needed for mild pain or headache.    [name] amLODipine (NORVASC) 10 MG tablet TAKE 1 TABLET EVERY DAY (DOSE INCREASE. DISCONTINUE AMLODIPINE 5MG  AND DILTIAZEM) 90 tablet 3  . apixaban (ELIQUIS) 5 MG TABS tablet Take 1 tablet (5 mg total) by mouth 2 (two) times daily. 180 tablet 3  . atorvastatin (LIPITOR) 80 MG tablet Take 1 tablet (80 mg total) by mouth every evening. 30 tablet 0  . Cholecalciferol (D3-1000 PO) Take 1,000 Units by mouth daily.     . Cyanocobalamin (B-12 PO) Take 1 tablet by mouth daily.     . DROPLET INSULIN SYRINGE 30G X 1/2" 1 ML MISC     . folic acid (FOLVITE) 1 MG tablet Take 1 tablet (1 mg total) by mouth daily. 30 tablet 1  . furosemide (LASIX) 40 MG tablet Take 40 mg by mouth. Take 40 mg in the morning and 40 mg in the evening    . insulin glargine (LANTUS) 100 UNIT/ML injection Inject 30-40 Units into the skin See admin instructions. Inject 40 units SQ in the morning and inject 30 units SQ at bedtime    . lisinopril (PRINIVIL,ZESTRIL) 5 MG tablet Take 5 mg by mouth daily.     . meclizine (ANTIVERT) 12.5 MG tablet Take 1 tablet (12.5 mg total) by mouth 3 (three) times daily as needed for dizziness. 30 tablet 0  . metFORMIN (GLUCOPHAGE) 1000 MG tablet Take 1,000 mg by mouth 2 (two) times daily.    . metoprolol succinate (TOPROL-XL) 100 MG 24 hr tablet Take 1 tablet (100 mg total) by mouth daily. 30 tablet 1  . nitroGLYCERIN (NITROSTAT) 0.4 MG SL tablet PLACE 1 TABLET (0.4 MG TOTAL) UNDER THE TONGUE EVERY 5  MINUTES AS NEEDED FOR CHEST PAIN. (Patient taking differently: Place 0.4 mg under  the tongue every 5 (five) minutes as needed. ) 25 tablet 3  . pantoprazole (PROTONIX) 40 MG tablet Take 1 tablet (40 mg total) by mouth daily. To protect stomach while taking multiple blood thinners. 30 tablet 2  . psyllium (METAMUCIL) 58.6 % packet Take 1 packet by mouth daily.    . traZODone (DESYREL) 100 MG  tablet Take 100 mg by mouth at bedtime.     . isosorbide mononitrate (IMDUR) 30 MG 24 hr tablet Take 0.5 tablets (15 mg total) by mouth daily. 45 tablet 3  . metoprolol tartrate (LOPRESSOR) 100 MG tablet      No current facility-administered medications on file prior to visit.    Allergies  Allergen Reactions  . Bactrim [Sulfamethoxazole-Trimethoprim]     Pancytopenia  . Codeine Shortness Of Breath  . Heparin Other (See Comments)    +HIT,  Severe bleeding (with heparin drip & large doses)  . Losartan Swelling    Per ENT UNSPECIFIED REACTION   . Other Other (See Comments)    Severe bleeding UNSPECIFIED AGENT   . Oxycodone Other (See Comments)    "Made me act out of my mind" Other reaction(s): Other (See Comments) Mental status changes hallucinations   Family History  Problem Relation Age of Onset  . Heart attack Father 65  . COPD Father   . COPD Mother   . Heart disease Mother   . Diabetes Mother   . Hypertension Sister   . CVA Sister   . Diabetes Sister   . Multiple sclerosis Sister    Social History   Socioeconomic History  . Marital status: Married    Spouse name: Not on file  . Number of children: Not on file  . Years of education: Not on file  . Highest education level: Not on file  Occupational History  . Not on file  Social Needs  . Financial resource strain: Not on file  . Food insecurity    Worry: Not on file    Inability: Not on file  . Transportation needs    Medical: Not on file    Non-medical: Not on file  Tobacco Use  . Smoking status: Former Smoker    Packs/day: 2.00    Years: 30.00    Pack years: 60.00    Types: Cigarettes    Start  date: 03/28/1953    Quit date: 03/29/1983    Years since quitting: 35.6  . Smokeless tobacco: Never Used  Substance and Sexual Activity  . Alcohol use: Not Currently    Comment: 05/18/2017 'I drink a beer q yr"  . Drug use: No  . Sexual activity: Not on file  Lifestyle  . Physical activity    Days per week: Not on file    Minutes per session: Not on file  . Stress: Not on file  Relationships  . Social Herbalist on phone: Not on file    Gets together: Not on file    Attends religious service: Not on file    Active member of club or organization: Not on file    Attends meetings of clubs or organizations: Not on file    Relationship status: Not on file  . Intimate partner violence    Fear of current or ex partner: Not on file    Emotionally abused: Not on file    Physically abused: Not on file    Forced sexual activity: Not on file  Other Topics Concern  . Not on file  Social History Narrative  . Not on file   Review of Systems  See HPI, all other systems reviewed and are negative    Objective:   Physical Exam BP (!) 100/53   Pulse (!) 41   Temp 97.8 F (36.6 C) (Oral)   Resp 18   Ht 5\' 7"  (1.702 m)  Wt 198 lb 1.6 oz (89.9 kg)   BMI 31.03 kg/m  General: 85 year old male with a pale complexion and slow gait ambulating with assistance of a cane in no acute distress Eyes: Sclera nonicteric, conjunctive are pink Neck: Supple, no lymphadenopathy or thyromegaly Heart: Bradycardic, systolic murmur Lungs: Breath sounds clear throughout  Abdomen: Moderate tenderness to the epigastric, left upper quadrant and right lower quadrant without rebound or guarding, positive bowel sounds to all 4 quadrants, no HSM, right lower quadrant scar Rectal: No external hemorrhoids.  Stools soft brown grossly guaiac positive Extremities: Lateral 1+ lower extremity edema    Assessment & Plan:   28. 85 year old male with history of CAD, 3 DES, atrial fibrillation on Eliquis and Plavix  presents with symptomatic anemia, SOB, heme + stool with bradycardia and hypotension (heart rate 40) blood pressure 100/53, fall at home x 2 past week with upper and lower abdominal pain,  stool grossly heme positive on exam. -Patient sent directly to [facility] emergency room, patient refused transport by ambulance, he was accompanied by her nurse via wheelchair safely to his car to present directly to the emergency room.  -patient will most likely require EGD and colonoscopy as an inpatient -patient will need cardiac consult  -patient to follow up in our office after discharged from the hospital   2. GERD -see # 1  3. DM II on insulin  4. Sleep apnea  5. PVD

## 2018-11-26 ENCOUNTER — Inpatient Hospital Stay (HOSPITAL_COMMUNITY)
Admission: EM | Admit: 2018-11-26 | Discharge: 2018-11-29 | DRG: 377 | Disposition: A | Discharge status: Home Health | Payer: Medicare HMO | Attending: Internal Medicine | Admitting: Internal Medicine

## 2018-11-26 ENCOUNTER — Emergency Department (HOSPITAL_COMMUNITY): Payer: Medicare HMO

## 2018-11-26 ENCOUNTER — Other Ambulatory Visit: Payer: Self-pay

## 2018-11-26 ENCOUNTER — Ambulatory Visit (INDEPENDENT_AMBULATORY_CARE_PROVIDER_SITE_OTHER): Payer: Medicare HMO | Admitting: Nurse Practitioner

## 2018-11-26 ENCOUNTER — Encounter (HOSPITAL_COMMUNITY): Payer: Self-pay | Admitting: Emergency Medicine

## 2018-11-26 ENCOUNTER — Encounter (INDEPENDENT_AMBULATORY_CARE_PROVIDER_SITE_OTHER): Payer: Self-pay | Admitting: Nurse Practitioner

## 2018-11-26 DIAGNOSIS — I2510 Atherosclerotic heart disease of native coronary artery without angina pectoris: Secondary | ICD-10-CM | POA: Diagnosis not present

## 2018-11-26 DIAGNOSIS — I951 Orthostatic hypotension: Secondary | ICD-10-CM | POA: Diagnosis not present

## 2018-11-26 DIAGNOSIS — R001 Bradycardia, unspecified: Secondary | ICD-10-CM | POA: Diagnosis not present

## 2018-11-26 DIAGNOSIS — K921 Melena: Secondary | ICD-10-CM | POA: Diagnosis present

## 2018-11-26 DIAGNOSIS — K922 Gastrointestinal hemorrhage, unspecified: Secondary | ICD-10-CM | POA: Diagnosis not present

## 2018-11-26 DIAGNOSIS — K317 Polyp of stomach and duodenum: Secondary | ICD-10-CM | POA: Diagnosis present

## 2018-11-26 DIAGNOSIS — E11649 Type 2 diabetes mellitus with hypoglycemia without coma: Secondary | ICD-10-CM | POA: Diagnosis not present

## 2018-11-26 DIAGNOSIS — D500 Iron deficiency anemia secondary to blood loss (chronic): Secondary | ICD-10-CM | POA: Diagnosis not present

## 2018-11-26 DIAGNOSIS — D649 Anemia, unspecified: Secondary | ICD-10-CM | POA: Diagnosis not present

## 2018-11-26 DIAGNOSIS — N183 Chronic kidney disease, stage 3 (moderate): Secondary | ICD-10-CM | POA: Diagnosis not present

## 2018-11-26 DIAGNOSIS — K5730 Diverticulosis of large intestine without perforation or abscess without bleeding: Secondary | ICD-10-CM | POA: Diagnosis not present

## 2018-11-26 DIAGNOSIS — K3189 Other diseases of stomach and duodenum: Secondary | ICD-10-CM | POA: Diagnosis not present

## 2018-11-26 DIAGNOSIS — R0902 Hypoxemia: Secondary | ICD-10-CM | POA: Diagnosis present

## 2018-11-26 DIAGNOSIS — J449 Chronic obstructive pulmonary disease, unspecified: Secondary | ICD-10-CM | POA: Diagnosis present

## 2018-11-26 DIAGNOSIS — E039 Hypothyroidism, unspecified: Secondary | ICD-10-CM | POA: Diagnosis present

## 2018-11-26 DIAGNOSIS — R195 Other fecal abnormalities: Secondary | ICD-10-CM

## 2018-11-26 DIAGNOSIS — I739 Peripheral vascular disease, unspecified: Secondary | ICD-10-CM | POA: Diagnosis not present

## 2018-11-26 DIAGNOSIS — E1122 Type 2 diabetes mellitus with diabetic chronic kidney disease: Secondary | ICD-10-CM | POA: Diagnosis present

## 2018-11-26 DIAGNOSIS — Z20828 Contact with and (suspected) exposure to other viral communicable diseases: Secondary | ICD-10-CM | POA: Diagnosis not present

## 2018-11-26 DIAGNOSIS — I4891 Unspecified atrial fibrillation: Secondary | ICD-10-CM | POA: Diagnosis not present

## 2018-11-26 DIAGNOSIS — Z833 Family history of diabetes mellitus: Secondary | ICD-10-CM

## 2018-11-26 DIAGNOSIS — I130 Hypertensive heart and chronic kidney disease with heart failure and stage 1 through stage 4 chronic kidney disease, or unspecified chronic kidney disease: Secondary | ICD-10-CM | POA: Diagnosis not present

## 2018-11-26 DIAGNOSIS — E1151 Type 2 diabetes mellitus with diabetic peripheral angiopathy without gangrene: Secondary | ICD-10-CM | POA: Diagnosis present

## 2018-11-26 DIAGNOSIS — D62 Acute posthemorrhagic anemia: Secondary | ICD-10-CM | POA: Diagnosis not present

## 2018-11-26 DIAGNOSIS — K228 Other specified diseases of esophagus: Secondary | ICD-10-CM | POA: Diagnosis not present

## 2018-11-26 DIAGNOSIS — E1165 Type 2 diabetes mellitus with hyperglycemia: Secondary | ICD-10-CM | POA: Diagnosis present

## 2018-11-26 DIAGNOSIS — Z9981 Dependence on supplemental oxygen: Secondary | ICD-10-CM

## 2018-11-26 DIAGNOSIS — Z87891 Personal history of nicotine dependence: Secondary | ICD-10-CM

## 2018-11-26 DIAGNOSIS — I25119 Atherosclerotic heart disease of native coronary artery with unspecified angina pectoris: Secondary | ICD-10-CM | POA: Diagnosis not present

## 2018-11-26 DIAGNOSIS — Z66 Do not resuscitate: Secondary | ICD-10-CM | POA: Diagnosis present

## 2018-11-26 DIAGNOSIS — Z981 Arthrodesis status: Secondary | ICD-10-CM

## 2018-11-26 DIAGNOSIS — K766 Portal hypertension: Secondary | ICD-10-CM | POA: Diagnosis not present

## 2018-11-26 DIAGNOSIS — I4821 Permanent atrial fibrillation: Secondary | ICD-10-CM | POA: Diagnosis not present

## 2018-11-26 DIAGNOSIS — Z89429 Acquired absence of other toe(s), unspecified side: Secondary | ICD-10-CM | POA: Diagnosis not present

## 2018-11-26 DIAGNOSIS — Z7902 Long term (current) use of antithrombotics/antiplatelets: Secondary | ICD-10-CM

## 2018-11-26 DIAGNOSIS — I480 Paroxysmal atrial fibrillation: Secondary | ICD-10-CM | POA: Diagnosis present

## 2018-11-26 DIAGNOSIS — Z79899 Other long term (current) drug therapy: Secondary | ICD-10-CM

## 2018-11-26 DIAGNOSIS — E782 Mixed hyperlipidemia: Secondary | ICD-10-CM | POA: Diagnosis present

## 2018-11-26 DIAGNOSIS — K219 Gastro-esophageal reflux disease without esophagitis: Secondary | ICD-10-CM | POA: Diagnosis not present

## 2018-11-26 DIAGNOSIS — Z955 Presence of coronary angioplasty implant and graft: Secondary | ICD-10-CM | POA: Diagnosis not present

## 2018-11-26 DIAGNOSIS — I959 Hypotension, unspecified: Secondary | ICD-10-CM | POA: Diagnosis not present

## 2018-11-26 DIAGNOSIS — J9611 Chronic respiratory failure with hypoxia: Secondary | ICD-10-CM | POA: Diagnosis present

## 2018-11-26 DIAGNOSIS — I5032 Chronic diastolic (congestive) heart failure: Secondary | ICD-10-CM | POA: Diagnosis not present

## 2018-11-26 DIAGNOSIS — I5033 Acute on chronic diastolic (congestive) heart failure: Secondary | ICD-10-CM | POA: Diagnosis not present

## 2018-11-26 DIAGNOSIS — R1013 Epigastric pain: Secondary | ICD-10-CM

## 2018-11-26 DIAGNOSIS — Z794 Long term (current) use of insulin: Secondary | ICD-10-CM | POA: Diagnosis not present

## 2018-11-26 DIAGNOSIS — Z7901 Long term (current) use of anticoagulants: Secondary | ICD-10-CM

## 2018-11-26 DIAGNOSIS — I110 Hypertensive heart disease with heart failure: Secondary | ICD-10-CM | POA: Diagnosis not present

## 2018-11-26 DIAGNOSIS — I25118 Atherosclerotic heart disease of native coronary artery with other forms of angina pectoris: Secondary | ICD-10-CM | POA: Diagnosis not present

## 2018-11-26 DIAGNOSIS — R0602 Shortness of breath: Secondary | ICD-10-CM | POA: Diagnosis not present

## 2018-11-26 DIAGNOSIS — I4819 Other persistent atrial fibrillation: Secondary | ICD-10-CM

## 2018-11-26 DIAGNOSIS — Z96653 Presence of artificial knee joint, bilateral: Secondary | ICD-10-CM | POA: Diagnosis present

## 2018-11-26 DIAGNOSIS — G4733 Obstructive sleep apnea (adult) (pediatric): Secondary | ICD-10-CM | POA: Diagnosis present

## 2018-11-26 DIAGNOSIS — D132 Benign neoplasm of duodenum: Secondary | ICD-10-CM | POA: Diagnosis not present

## 2018-11-26 HISTORY — DX: Essential (primary) hypertension: I10

## 2018-11-26 LAB — COMPREHENSIVE METABOLIC PANEL
ALT: 15 U/L (ref 0–44)
AST: 21 U/L (ref 15–41)
Albumin: 4.1 g/dL (ref 3.5–5.0)
Alkaline Phosphatase: 64 U/L (ref 38–126)
Anion gap: 12 (ref 5–15)
BUN: 28 mg/dL — ABNORMAL HIGH (ref 8–23)
CO2: 26 mmol/L (ref 22–32)
Calcium: 9.1 mg/dL (ref 8.9–10.3)
Chloride: 101 mmol/L (ref 98–111)
Creatinine, Ser: 1.36 mg/dL — ABNORMAL HIGH (ref 0.61–1.24)
GFR calc Af Amer: 55 mL/min — ABNORMAL LOW (ref >60)
GFR calc non Af Amer: 48 mL/min — ABNORMAL LOW (ref >60)
Glucose, Bld: 82 mg/dL (ref 70–99)
Potassium: 3.9 mmol/L (ref 3.5–5.1)
Sodium: 139 mmol/L (ref 135–145)
Total Bilirubin: 0.7 mg/dL (ref 0.3–1.2)
Total Protein: 7.1 g/dL (ref 6.5–8.1)

## 2018-11-26 LAB — CBC WITH DIFFERENTIAL/PLATELET
Abs Immature Granulocytes: 0.03 10*3/uL (ref 0.00–0.07)
Basophils Absolute: 0 10*3/uL (ref 0.0–0.1)
Basophils Relative: 1 %
Eosinophils Absolute: 0.1 10*3/uL (ref 0.0–0.5)
Eosinophils Relative: 1 %
HCT: 27.9 % — ABNORMAL LOW (ref 39.0–52.0)
Hemoglobin: 8.2 g/dL — ABNORMAL LOW (ref 13.0–17.0)
Immature Granulocytes: 0 %
Lymphocytes Relative: 13 %
Lymphs Abs: 1 10*3/uL (ref 0.7–4.0)
MCH: 22.7 pg — ABNORMAL LOW (ref 26.0–34.0)
MCHC: 29.4 g/dL — ABNORMAL LOW (ref 30.0–36.0)
MCV: 77.3 fL — ABNORMAL LOW (ref 80.0–100.0)
Monocytes Absolute: 1.6 10*3/uL — ABNORMAL HIGH (ref 0.1–1.0)
Monocytes Relative: 22 %
Neutro Abs: 4.6 10*3/uL (ref 1.7–7.7)
Neutrophils Relative %: 63 %
Platelets: 193 10*3/uL (ref 150–400)
RBC: 3.61 MIL/uL — ABNORMAL LOW (ref 4.22–5.81)
RDW: 23 % — ABNORMAL HIGH (ref 11.5–15.5)
WBC: 7.3 10*3/uL (ref 4.0–10.5)
nRBC: 0 % (ref 0.0–0.2)

## 2018-11-26 LAB — HEMOGLOBIN A1C
Hgb A1c MFr Bld: 6.1 % — ABNORMAL HIGH (ref 4.8–5.6)
Mean Plasma Glucose: 128.37 mg/dL

## 2018-11-26 LAB — TROPONIN I (HIGH SENSITIVITY)
Troponin I (High Sensitivity): 10 ng/L (ref <18)
Troponin I (High Sensitivity): 9 ng/L (ref <18)

## 2018-11-26 LAB — GLUCOSE, CAPILLARY
Glucose-Capillary: 61 mg/dL — ABNORMAL LOW (ref 70–99)
Glucose-Capillary: 51 mg/dL — ABNORMAL LOW (ref 70–99)
Glucose-Capillary: 95 mg/dL (ref 70–99)

## 2018-11-26 LAB — SARS CORONAVIRUS 2 BY RT PCR (HOSPITAL ORDER, PERFORMED IN ~~LOC~~ HOSPITAL LAB): SARS Coronavirus 2: NEGATIVE

## 2018-11-26 LAB — BRAIN NATRIURETIC PEPTIDE: B Natriuretic Peptide: 428 pg/mL — ABNORMAL HIGH (ref 0.0–100.0)

## 2018-11-26 LAB — LIPASE, BLOOD: Lipase: 29 U/L (ref 11–51)

## 2018-11-26 MED ORDER — FUROSEMIDE 10 MG/ML IJ SOLN
40.0000 mg | Freq: Two times a day (BID) | INTRAMUSCULAR | Status: DC
  Administered 2018-11-26: 18:00:00 40 mg via INTRAVENOUS
  Filled 2018-11-26: qty 4

## 2018-11-26 MED ORDER — CHLORHEXIDINE GLUCONATE CLOTH 2 % EX PADS
6.0000 | MEDICATED_PAD | Freq: Every day | CUTANEOUS | Status: DC
  Administered 2018-11-26 – 2018-11-28 (×3): 6 via TOPICAL

## 2018-11-26 MED ORDER — ACETAMINOPHEN 650 MG RE SUPP: 650.0000 mg | Freq: Four times a day (QID) | RECTAL | Status: DC | PRN

## 2018-11-26 MED ORDER — MUPIROCIN 2 % EX OINT: 1.0000 "application " | TOPICAL_OINTMENT | Freq: Two times a day (BID) | CUTANEOUS | Status: DC

## 2018-11-26 MED ORDER — ATORVASTATIN CALCIUM 40 MG PO TABS
80.0000 mg | ORAL_TABLET | Freq: Every evening | ORAL | Status: DC
  Administered 2018-11-26 – 2018-11-28 (×3): 80 mg via ORAL
  Filled 2018-11-26 (×4): qty 2

## 2018-11-26 MED ORDER — PANTOPRAZOLE SODIUM 40 MG IV SOLR
40.0000 mg | Freq: Two times a day (BID) | INTRAVENOUS | Status: DC
  Administered 2018-11-26 – 2018-11-29 (×7): 40 mg via INTRAVENOUS
  Filled 2018-11-26 (×7): qty 40

## 2018-11-26 MED ORDER — INSULIN ASPART 100 UNIT/ML ~~LOC~~ SOLN
0.0000 [IU] | Freq: Four times a day (QID) | SUBCUTANEOUS | Status: DC
  Administered 2018-11-27: 18:00:00 1 [IU] via SUBCUTANEOUS
  Administered 2018-11-27: 12:00:00 2 [IU] via SUBCUTANEOUS
  Administered 2018-11-28 (×2): 1 [IU] via SUBCUTANEOUS
  Administered 2018-11-29: 12:00:00 2 [IU] via SUBCUTANEOUS

## 2018-11-26 MED ORDER — ONDANSETRON HCL 4 MG PO TABS: 4.0000 mg | ORAL_TABLET | Freq: Four times a day (QID) | ORAL | Status: DC | PRN

## 2018-11-26 MED ORDER — INSULIN GLARGINE 100 UNIT/ML ~~LOC~~ SOLN
15.0000 [IU] | Freq: Two times a day (BID) | SUBCUTANEOUS | Status: DC
  Administered 2018-11-26: 22:00:00 15 [IU] via SUBCUTANEOUS
  Filled 2018-11-26 (×4): qty 0.15

## 2018-11-26 MED ORDER — CHLORHEXIDINE GLUCONATE CLOTH 2 % EX PADS: 6.0000 | MEDICATED_PAD | Freq: Every day | CUTANEOUS | Status: DC

## 2018-11-26 MED ORDER — ACETAMINOPHEN 325 MG PO TABS
650.0000 mg | ORAL_TABLET | Freq: Four times a day (QID) | ORAL | Status: DC | PRN
  Administered 2018-11-27 – 2018-11-28 (×2): 650 mg via ORAL
  Filled 2018-11-26 (×2): qty 2

## 2018-11-26 MED ORDER — DEXTROSE 50 % IV SOLN
25.0000 g | INTRAVENOUS | Status: AC
  Administered 2018-11-26: 20:00:00 25 g via INTRAVENOUS

## 2018-11-26 MED ORDER — INSULIN ASPART 100 UNIT/ML ~~LOC~~ SOLN: 0.0000 [IU] | Freq: Every day | SUBCUTANEOUS | Status: DC

## 2018-11-26 MED ORDER — TRAZODONE HCL 50 MG PO TABS
100.0000 mg | ORAL_TABLET | Freq: Every day | ORAL | Status: DC
  Administered 2018-11-26 – 2018-11-28 (×3): 100 mg via ORAL
  Filled 2018-11-26 (×3): qty 2

## 2018-11-26 MED ORDER — SODIUM CHLORIDE 0.9 % IV SOLN: 250.0000 mL | INTRAVENOUS | Status: DC | PRN

## 2018-11-26 MED ORDER — SODIUM CHLORIDE 0.9% FLUSH
3.0000 mL | Freq: Two times a day (BID) | INTRAVENOUS | Status: DC
  Administered 2018-11-26 – 2018-11-29 (×6): 3 mL via INTRAVENOUS

## 2018-11-26 MED ORDER — ONDANSETRON HCL 4 MG/2ML IJ SOLN: 4.0000 mg | Freq: Four times a day (QID) | INTRAMUSCULAR | Status: DC | PRN

## 2018-11-26 MED ORDER — SODIUM CHLORIDE 0.9% FLUSH: 3.0000 mL | INTRAVENOUS | Status: DC | PRN

## 2018-11-26 NOTE — H&P (Addendum)
History and Physical    [name] [id number] DOB: 07/20/1933 DOA: 11/26/2018  PCP: Monico Blitz, MD   Patient coming from: GI office  Chief Complaint: Positive stool hemoccult  HPI: Mike Wright is a 85 y.o. male with medical history significant for CAD with prior stents, atrial fibrillation on Eliquis, GERD, type 2 diabetes, obstructive sleep apnea, COPD with chronic hypoxemia, chronic diastolic heart failure, and peripheral vascular disease who was recently hospitalized at [facility] rocking him on 8/10 and transfused 2 units of PRBCs.  He has had problems with chronic anemia with prior endoscopies that have been unrevealing.  He was attending a follow-up with his gastroenterologist Dr. Laural Golden today when he was noted to be occult positive.  He is also been experiencing some generalized fatigue and weakness that have been worse in the past few weeks and has been associated with some mild dyspnea.  He apparently usually feels this way when he has worsening levels of anemia.  His weight has otherwise been stable and he has chronic lower extremity edema noted.  He denies any chest pain, fevers, chills, cough, palpitations, lightheadedness, dizziness, nausea, or vomiting.  No dark or bloody stools noted.   ED Course: Vital signs demonstrate heart rate that varies from 40 bpm all the way up to 130 bpm.  EKG demonstrates atrial fibrillation at 48 bpm.  1 view chest x-ray with some cardiomegaly and pulmonary vascular congestion and small effusions noted with BNP of 428.  High-sensitivity troponin of 10.  Hemoglobin is currently 8.2 and usually is around 10 at baseline.  BUN 28 and creatinine 1.36 with baseline of 1.  Blood glucose is currently 238.  COVID testing is negative.  EDP has spoken with Dr. Domenic Polite of cardiology who states that he should be monitored carefully on telemetry and metoprolol should be withheld.  Review of Systems: All others reviewed and is otherwise negative aside from what is  noted above.  Past Medical History:  Diagnosis Date  . Anemia    a. mild, noted 04/2017.  [name] CAD in native artery    a. [name] 04/2017 s/p DES to D1, DES to prox-mid LAD, DES to prox LAD overlapping the prior stent, LVEF 55-65%.   . Chronic diastolic CHF (congestive heart failure) ([place])   . Diabetic ulcer of toe ([place])   . DJD (degenerative joint disease) of cervical spine   . Essential hypertension, benign   . GERD (gastroesophageal reflux disease)   . History of hiatal hernia   . HIT (heparin-induced thrombocytopenia) ([place])   . Hypothyroidism   . Hypoxia    a. went home on home O2 04/2017.  [name] Insomnia   . Mixed hyperlipidemia   . PAD (peripheral artery disease) ([place])   . PAF (paroxysmal atrial fibrillation) ([place])   . PVD (peripheral vascular disease) ([place])   . Renal insufficiency   . 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  . Sinus bradycardia    a. HR 30s-40s in 04/2017 -> diltiazem stopped, metoprolol reduced.  . Sleep apnea    "chose not to order CPAP at this time" (05/18/2017)  . Type 2 diabetes mellitus ([place])   . Wheezing    a. suspected COPD 04/2017. Former tobacco x 40 years.    Past Surgical History:  Procedure Laterality Date  . ABDOMINAL AORTOGRAM W/LOWER EXTREMITY N/A 09/15/2017   Procedure: ABDOMINAL AORTOGRAM W/LOWER EXTREMITY;  Surgeon: Serafina Mitchell, MD;  Location: [place] [facility];  Service: Cardiovascular;  Laterality:  N/A;  . AMPUTATION Bilateral 09/20/2017   Procedure: BILATERAL GREAT TOE AMPUTATIONS INCLUDING METATARSAL HEADS;  Surgeon: Serafina Mitchell, MD;  Location: [facility];  Service: Vascular;  Laterality: Bilateral;  . ANGIOPLASTY Right 09/20/2017   Procedure: ANGIOPLASTY RIGHT TIBIAL ARTERY;  Surgeon: Serafina Mitchell, MD;  Location: [place];  Service: Vascular;  Laterality: Right;  . APPENDECTOMY    . BACK SURGERY    . CARDIAC CATHETERIZATION  1980s; 2012;  . CATARACT EXTRACTION Left 2004  . COLONOSCOPY  2004   FLEISHMAN TICS  . CORONARY ANGIOPLASTY  WITH STENT PLACEMENT  05/18/2017   "3 stents"  . CORONARY STENT INTERVENTION N/A 05/18/2017   Procedure: CORONARY STENT INTERVENTION;  Surgeon: Jettie Booze, MD;  Location: [place] [facility];  Service: Cardiovascular;  Laterality: N/A;  . ESOPHAGOGASTRODUODENOSCOPY (EGD) WITH PROPOFOL N/A 11/20/2017   Procedure: ESOPHAGOGASTRODUODENOSCOPY (EGD) WITH PROPOFOL;  Surgeon: Irene Shipper, MD;  Location: [place];  Service: Gastroenterology;  Laterality: N/A;  . I&D EXTREMITY Right 12/17/2017   Procedure: IRRIGATION AND DEBRIDEMENT RIGHT GREAT TOE;  Surgeon: Serafina Mitchell, MD;  [facility]: [facility];  Service: Vascular;  Laterality: Right;  . JOINT REPLACEMENT    . LEFT HEART CATH AND CORONARY ANGIOGRAPHY N/A 05/18/2017   Procedure: LEFT HEART CATH AND CORONARY ANGIOGRAPHY;  Surgeon: Jettie Booze, MD;  Location: [place] [facility];  Service: Cardiovascular;  Laterality: N/A;  . LOWER EXTREMITY ANGIOGRAM Right 09/20/2017   Procedure: RIGHT LOWER LEG  ANGIOGRAM;  Surgeon: Serafina Mitchell, MD;  Location: [place];  Service: Vascular;  Laterality: Right;  . LUMBAR FUSION  2002   L3, 4 L4, 5 L5 S1 Fused by Dr. Glenna Fellows  . PERIPHERAL VASCULAR BALLOON ANGIOPLASTY Left 09/15/2017   Procedure: PERIPHERAL VASCULAR BALLOON ANGIOPLASTY;  Surgeon: Serafina Mitchell, MD;  Location: [place] [facility];  Service: Cardiovascular;  Laterality: Left;  PTA of Peroneal & Posterior Tibial  . POSTERIOR LUMBAR FUSION    . RHINOPLASTY    . RIGHT HEART CATH N/A 05/18/2017   Procedure: RIGHT HEART CATH;  Surgeon: Jettie Booze, MD;  Location: [place] [facility];  Service: Cardiovascular;  Laterality: N/A;  . TEE WITHOUT CARDIOVERSION N/A 09/18/2017   Procedure: TRANSESOPHAGEAL ECHOCARDIOGRAM (TEE);  Surgeon: Fay Records, MD;  Location: [facility];  Service: Cardiovascular;  Laterality: N/A;  . TOTAL KNEE ARTHROPLASTY Bilateral   . TRANSURETHRAL RESECTION OF PROSTATE  2001   [place]     reports that he  quit smoking about 35 years ago. His smoking use included cigarettes. He started smoking about 65 years ago. He has a 60.00 pack-year smoking history. He has never used smokeless tobacco. He reports previous alcohol use. He reports that he does not use drugs.  Allergies  Allergen Reactions  . Bactrim [Sulfamethoxazole-Trimethoprim]     Pancytopenia  . Codeine Shortness Of Breath  . Heparin Other (See Comments)    +HIT,  Severe bleeding (with heparin drip & large doses)  . Losartan Swelling    Per ENT UNSPECIFIED REACTION   . Other Other (See Comments)    Severe bleeding UNSPECIFIED AGENT   . Oxycodone Other (See Comments)    "Made me act out of my mind" Other reaction(s): Other (See Comments) Mental status changes hallucinations    Family History  Problem Relation Age of Onset  . Heart attack Father 32  . COPD Father   . COPD Mother   . Heart disease Mother   .  Diabetes Mother   . Hypertension Sister   . CVA Sister   . Diabetes Sister   . Multiple sclerosis Sister     Prior to Admission medications   Medication Sig Start Date End Date Taking? Authorizing Provider  amLODipine (NORVASC) 10 MG tablet TAKE 1 TABLET EVERY DAY (DOSE INCREASE. DISCONTINUE AMLODIPINE 5MG  AND DILTIAZEM) Patient taking differently: Take 10 mg by mouth daily.  03/16/18  Yes Branch, Alphonse Guild, MD  apixaban (ELIQUIS) 5 MG TABS tablet Take 1 tablet (5 mg total) by mouth 2 (two) times daily. 01/22/18  Yes BranchAlphonse Guild, MD  atorvastatin (LIPITOR) 80 MG tablet Take 1 tablet (80 mg total) by mouth every evening. 05/19/17  Yes Dunn, Dayna N, PA-C  acetaminophen (TYLENOL) 500 MG tablet Take 500 mg by mouth as needed for mild pain or headache.    [provider]  Cholecalciferol (D3-1000 PO) Take 1,000 Units by mouth daily.     [provider]  Cyanocobalamin (B-12 PO) Take 1 tablet by mouth daily.     [provider]  folic acid (FOLVITE) 1 MG tablet Take 1 tablet (1 mg total)  by mouth daily. 12/06/17   Oswald Hillock, MD  furosemide (LASIX) 40 MG tablet Take 40 mg by mouth. Take 40 mg in the morning and 40 mg in the evening    [provider]  insulin glargine (LANTUS) 100 UNIT/ML injection Inject 30-40 Units into the skin See admin instructions. Inject 40 units SQ in the morning and inject 30 units SQ at bedtime    [provider]  isosorbide mononitrate (IMDUR) 30 MG 24 hr tablet Take 0.5 tablets (15 mg total) by mouth daily. 04/04/18 07/27/18  Arnoldo Lenis, MD  lisinopril (PRINIVIL,ZESTRIL) 5 MG tablet Take 5 mg by mouth daily.  01/17/18   [provider]  meclizine (ANTIVERT) 12.5 MG tablet Take 1 tablet (12.5 mg total) by mouth 3 (three) times daily as needed for dizziness. 05/30/17   Richardson Dopp T, PA-C  metFORMIN (GLUCOPHAGE) 1000 MG tablet Take 1,000 mg by mouth 2 (two) times daily. 05/22/17   [provider]  metoprolol succinate (TOPROL-XL) 100 MG 24 hr tablet Take 1 tablet (100 mg total) by mouth daily. 12/05/17   Oswald Hillock, MD  metoprolol tartrate (LOPRESSOR) 100 MG tablet  01/17/18   [provider]  nitroGLYCERIN (NITROSTAT) 0.4 MG SL tablet PLACE 1 TABLET (0.4 MG TOTAL) UNDER THE TONGUE EVERY 5  MINUTES AS NEEDED FOR CHEST PAIN. Patient taking differently: Place 0.4 mg under the tongue every 5 (five) minutes as needed.  05/22/17   Arnoldo Lenis, MD  pantoprazole (PROTONIX) 40 MG tablet Take 1 tablet (40 mg total) by mouth daily. To protect stomach while taking multiple blood thinners. 11/22/17   Cheryln Manly, NP  psyllium (METAMUCIL) 58.6 % packet Take 1 packet by mouth daily.    [provider]  traZODone (DESYREL) 100 MG tablet Take 100 mg by mouth at bedtime.  03/25/16   [provider]    Physical Exam: Vitals:   11/26/18 1000 11/26/18 1130 11/26/18 1200  BP: (!) 134/46 107/70 106/67  Pulse: (!) 105 (!) 135 (!) 43  Resp: 18 (!) 23 19  Temp: 97.8 F (36.6 C)    TempSrc:  Oral    SpO2: 98% 99% 100%  Weight: 89.8 kg    Height: 5\' 7"  (1.702 m)      Constitutional: NAD, calm, comfortable Vitals:   11/26/18  1000 11/26/18 1130 11/26/18 1200  BP: (!) 134/46 107/70 106/67  Pulse: (!) 105 (!) 135 (!) 43  Resp: 18 (!) 23 19  Temp: 97.8 F (36.6 C)    TempSrc: Oral    SpO2: 98% 99% 100%  Weight: 89.8 kg    Height: 5\' 7"  (1.702 m)     Eyes: lids and conjunctivae normal ENMT: Mucous membranes are moist.  Neck: normal, supple Respiratory: clear to auscultation bilaterally. Normal respiratory effort. No accessory muscle use.  Currently on 2 L nasal cannula. Cardiovascular: Irregular rate and rhythm with some bradycardia, no murmurs. No extremity edema. Abdomen: no tenderness, no distention. Bowel sounds positive.  Musculoskeletal:  No joint deformity upper and lower extremities.   Skin: no rashes, lesions, ulcers.  Psychiatric: Normal judgment and insight. Alert and oriented x 3. Normal mood.   Labs on Admission: I have personally reviewed following labs and imaging studies  CBC: Recent Labs  Lab 11/26/18 1036  WBC 7.3  NEUTROABS 4.6  HGB 8.2*  HCT 27.9*  MCV 77.3*  PLT 0000000   Basic Metabolic Panel: Recent Labs  Lab 11/26/18 1036  NA 139  K 3.9  CL 101  CO2 26  GLUCOSE 82  BUN 28*  CREATININE 1.36*  CALCIUM 9.1   GFR: Estimated Creatinine Clearance: 44 mL/min (A) (by C-G formula based on SCr of 1.36 mg/dL (H)). Liver Function Tests: Recent Labs  Lab 11/26/18 1036  AST 21  ALT 15  ALKPHOS 64  BILITOT 0.7  PROT 7.1  ALBUMIN 4.1   Recent Labs  Lab 11/26/18 1036  LIPASE 29   No results for input(s): AMMONIA in the last 168 hours. Coagulation Profile: No results for input(s): INR, PROTIME in the last 168 hours. Cardiac Enzymes: No results for input(s): CKTOTAL, CKMB, CKMBINDEX, TROPONINI in the last 168 hours. BNP (last 3 results) No results for input(s): PROBNP in the last 8760 hours. HbA1C: No results for input(s): HGBA1C  in the last 72 hours. CBG: No results for input(s): GLUCAP in the last 168 hours. Lipid Profile: No results for input(s): CHOL, HDL, LDLCALC, TRIG, CHOLHDL, LDLDIRECT in the last 72 hours. Thyroid Function Tests: No results for input(s): TSH, T4TOTAL, FREET4, T3FREE, THYROIDAB in the last 72 hours. Anemia Panel: No results for input(s): VITAMINB12, FOLATE, FERRITIN, TIBC, IRON, RETICCTPCT in the last 72 hours. Urine analysis:    Component Value Date/Time   COLORURINE YELLOW 12/13/2017 2043   APPEARANCEUR CLEAR 12/13/2017 2043   LABSPEC 1.014 12/13/2017 2043   PHURINE 5.0 12/13/2017 2043   GLUCOSEU NEGATIVE 12/13/2017 2043   HGBUR NEGATIVE 12/13/2017 2043   BILIRUBINUR NEGATIVE 12/13/2017 2043   [place] NEGATIVE 12/13/2017 2043   PROTEINUR NEGATIVE 12/13/2017 2043   NITRITE NEGATIVE 12/13/2017 2043   LEUKOCYTESUR NEGATIVE 12/13/2017 2043    Radiological Exams on Admission: Dg Chest Port 1 View  Result Date: 11/26/2018 CLINICAL DATA:  Shortness of breath and weakness. EXAM: PORTABLE CHEST 1 VIEW COMPARISON:  Ribs and chest x-ray 12/02/2017 FINDINGS: Heart size is normal. Mild pulmonary vascular congestion is present. There is mild atelectasis at the bases. Small effusions are present. Minimal atelectasis is present at both bases. No other significant consolidation is present. IMPRESSION: 1. Cardiomegaly with mild pulmonary vascular congestion and small effusions consistent with congestive heart failure. Electronically Signed   By: San Morelle M.D.   On: 11/26/2018 11:20    EKG: Independently reviewed. Atrial fibrillation 48bpm.  Assessment/Plan Active Problems:   Acute blood loss anemia  Acute symptomatic blood loss anemia likely related to GI bleed -Hold home Eliquis and maintain on SCDs -Keep n.p.o. except sips and medications for now until further GI evaluation for likely endoscopy during this hospitalization -Maintain on PPI IV twice daily -Follow repeat CBC  and transfuse as needed for hemoglobin less than 7  Atrial fibrillation with possible tachybradycardia syndrome -Appreciate cardiology evaluation -Maintain on telemetry in stepdown unit to monitor carefully -Hold home metoprolol for now and allow heart rate to trend up  Type 2 diabetes with hyperglycemia -SSI -Decrease Lantus dose -Hold home metformin  CAD with prior stents -Hold Plavix and Eliquis for now given GI bleed -Continue statin -Hold Imdur  Hypertension with soft BP readings -Hold home lisinopril and amlodipine for now -Hold Imdur -Hold metoprolol due to bradycardia -Hold Lasix until taking PO  Chronic diastolic heart failure -Does not seem to be in acute failure currently, but has some signs of volume overload -Hold home oral Lasix until on stable diet -Monitor daily weights  GERD -We will keep on IV PPI twice daily for now  OSA -Wears nasal cannula oxygen at night and will continue  PVD with prior toe amputations -Hold Plavix for now as well as Eliquis -Continue statin  COPD with chronic hypoxemia -Continue nasal cannula oxygen -No active bronchospasms currently noted  DVT prophylaxis: SCDs Code Status: DNR Family Communication: Wife at bedside Disposition Plan:Admit for GI bleed evaluation and HR control Consults called:Cardiology and GI Admission status: Inpatient, SDU   Briellah Baik D Manuella Ghazi DO [facility] Pager [phone]  If 7PM-7AM, please contact night-coverage [email or web address] Password [email or web address]  11/26/2018, 1:36 PM

## 2018-11-26 NOTE — Patient Instructions (Signed)
Patient sent to [facility] Emergency Room due to having a low heart rate, low blood pressure, active dizziness, active shortness of breath with anemia and blood in stool on exam today.   Further follow up to be determined after you are discharged home from the hospital

## 2018-11-26 NOTE — Progress Notes (Signed)
Upon reviewing labs, Glucose was 61 around 1639 without intervention. Rechecked PT's glucose, now 51. Amp of Dextrose adminstered. Will recheck Glucose in 15-20 mins. Continue to monitor.

## 2018-11-26 NOTE — Progress Notes (Signed)
PT complains of pain in bladder area of abdomen. Urine output of 375 mL at 1945. Continue to monitor for pain or retention.

## 2018-11-26 NOTE — Consult Note (Signed)
Cardiology Consultation:   Patient ID: Mike Wright; HC:[id number]; 1933-07-31   Admit date: 11/26/2018 Date of Consult: 11/26/2018  Primary Care Provider: Monico Blitz, MD Primary Cardiologist: Carlyle Dolly, MD Primary Electrophysiologist: None   Patient Profile:   Mike Wright is an 85 y.o. male with a history of CAD (status post DES to the first diagonal and DES x2 to the LAD in February 2019), paroxysmal atrial fibrillation on Toprol-XL and Eliquis, chronic diastolic heart failure, PAD, mixed hyperlipidemia, type 2 diabetes mellitus, and anemia who is being seen today for the evaluation of bradycardia and atrial fibrillation at the request of Dr. Manuella Ghazi.  History of Present Illness:   Mr. Bischoff states that he was admitted to [facility] by his PCP Dr. Manuella Ghazi in mid August due to symptomatic anemia, although no obvious reported GI blood loss.  He did not describe hematochezia or melena, was having loose stools however and intermittent abdominal pain.  I do not have records, but patient recalls that he had 2 PRBC transfusions.  He presented for follow-up with Dr. Laural Golden today and was found to be heme positive, complaining of weakness and referred to the ER.  Patient noted to be bradycardic in atrial fibrillation with heart rates in the 30s to 40s (predominantly 40s) and had already taken Toprol-XL 100 mg at 5 AM this morning.  He states that his heart rate has been slower than normal over the last 1 to 2 months.  Rapid heart rates in the 130s are recorded by ER vital signs, although review of telemetry so far shows only bradycardia in the ICU.  He has had no syncope.  He states that he had presumed angina symptoms last week, took 2 sublingual nitroglycerin.  He has otherwise been compliant with his medications.  Troponin I levels are in normal range at 10 and 9, BNP elevated at 428.  Patient has vascular congestion and small pleural effusions.  Past Medical History:  Diagnosis Date   . Anemia    a. mild, noted 04/2017.  [name] CAD in native artery    a. [name] 04/2017 s/p DES to D1, DES to prox-mid LAD, DES to prox LAD overlapping the prior stent, LVEF 55-65%.   . Chronic diastolic CHF (congestive heart failure) ([place])   . Diabetic ulcer of toe ([place])   . DJD (degenerative joint disease) of cervical spine   . Essential hypertension   . GERD (gastroesophageal reflux disease)   . History of hiatal hernia   . HIT (heparin-induced thrombocytopenia) ([place])   . Hypothyroidism   . Hypoxia    a. went home on home O2 04/2017.  [name] Insomnia   . Mixed hyperlipidemia   . PAD (peripheral artery disease) ([place])   . PAF (paroxysmal atrial fibrillation) ([place])   . PVD (peripheral vascular disease) ([place])   . Renal insufficiency   . 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  . Sinus bradycardia    a. HR 30s-40s in 04/2017 -> diltiazem stopped, metoprolol reduced.  . Sleep apnea    "chose not to order CPAP at this time" (05/18/2017)  . Type 2 diabetes mellitus ([place])   . Wheezing    a. suspected COPD 04/2017. Former tobacco x 40 years.    Past Surgical History:  Procedure Laterality Date  . ABDOMINAL AORTOGRAM W/LOWER EXTREMITY N/A 09/15/2017   Procedure: ABDOMINAL AORTOGRAM W/LOWER EXTREMITY;  Surgeon: Serafina Mitchell, MD;  Location: [facility];  Service: Cardiovascular;  Laterality: N/A;  .  AMPUTATION Bilateral 09/20/2017   Procedure: BILATERAL GREAT TOE AMPUTATIONS INCLUDING METATARSAL HEADS;  Surgeon: Serafina Mitchell, MD;  Location: [facility];  Service: Vascular;  Laterality: Bilateral;  . ANGIOPLASTY Right 09/20/2017   Procedure: ANGIOPLASTY RIGHT TIBIAL ARTERY;  Surgeon: Serafina Mitchell, MD;  Location: [facility];  Service: Vascular;  Laterality: Right;  . APPENDECTOMY    . BACK SURGERY    . CARDIAC CATHETERIZATION  1980s; 2012;  . CATARACT EXTRACTION Left 2004  . COLONOSCOPY  2004   FLEISHMAN TICS  . CORONARY ANGIOPLASTY WITH STENT PLACEMENT  05/18/2017   "3 stents"  . CORONARY  STENT INTERVENTION N/A 05/18/2017   Procedure: CORONARY STENT INTERVENTION;  Surgeon: Jettie Booze, MD;  Location: [facility];  Service: Cardiovascular;  Laterality: N/A;  . ESOPHAGOGASTRODUODENOSCOPY (EGD) WITH PROPOFOL N/A 11/20/2017   Procedure: ESOPHAGOGASTRODUODENOSCOPY (EGD) WITH PROPOFOL;  Surgeon: Irene Shipper, MD;  Location: [place];  Service: Gastroenterology;  Laterality: N/A;  . I&D EXTREMITY Right 12/17/2017   Procedure: IRRIGATION AND DEBRIDEMENT RIGHT GREAT TOE;  Surgeon: Serafina Mitchell, MD;  Location: [facility];  Service: Vascular;  Laterality: Right;  . JOINT REPLACEMENT    . LEFT HEART CATH AND CORONARY ANGIOGRAPHY N/A 05/18/2017   Procedure: LEFT HEART CATH AND CORONARY ANGIOGRAPHY;  Surgeon: Jettie Booze, MD;  Location: [facility];  Service: Cardiovascular;  Laterality: N/A;  . LOWER EXTREMITY ANGIOGRAM Right 09/20/2017   Procedure: RIGHT LOWER LEG  ANGIOGRAM;  Surgeon: Serafina Mitchell, MD;  Location: [place];  Service: Vascular;  Laterality: Right;  . LUMBAR FUSION  2002   L3, 4 L4, 5 L5 S1 Fused by Dr. Glenna Fellows  . PERIPHERAL VASCULAR BALLOON ANGIOPLASTY Left 09/15/2017   Procedure: PERIPHERAL VASCULAR BALLOON ANGIOPLASTY;  Surgeon: Serafina Mitchell, MD;  Location: [facility];  Service: Cardiovascular;  Laterality: Left;  PTA of Peroneal & Posterior Tibial  . POSTERIOR LUMBAR FUSION    . RHINOPLASTY    . RIGHT HEART CATH N/A 05/18/2017   Procedure: RIGHT HEART CATH;  Surgeon: Jettie Booze, MD;  Location: [facility];  Service: Cardiovascular;  Laterality: N/A;  . TEE WITHOUT CARDIOVERSION N/A 09/18/2017   Procedure: TRANSESOPHAGEAL ECHOCARDIOGRAM (TEE);  Surgeon: Fay Records, MD;  Location: [facility];  Service: Cardiovascular;  Laterality: N/A;  . TOTAL KNEE ARTHROPLASTY Bilateral   . TRANSURETHRAL RESECTION OF PROSTATE  2001   [place]     Inpatient Medications: Scheduled Meds: . atorvastatin  80 mg Oral QPM  .  Chlorhexidine Gluconate Cloth  6 each Topical Daily  . insulin aspart  0-9 Units Subcutaneous Q6H  . insulin glargine  15 Units Subcutaneous BID  . pantoprazole (PROTONIX) IV  40 mg Intravenous Q12H  . sodium chloride flush  3 mL Intravenous Q12H  . traZODone  100 mg Oral QHS   Continuous Infusions: . sodium chloride     PRN Meds: sodium chloride, acetaminophen **OR** acetaminophen, ondansetron **OR** ondansetron (ZOFRAN) IV, sodium chloride flush  Allergies:    Allergies  Allergen Reactions  . Bactrim [Sulfamethoxazole-Trimethoprim]     Pancytopenia  . Codeine Shortness Of Breath  . Heparin Other (See Comments)    +HIT,  Severe bleeding (with heparin drip & large doses)  . Losartan Swelling    Per ENT UNSPECIFIED REACTION   . Other Other (See Comments)    Severe bleeding UNSPECIFIED AGENT   . Oxycodone Other (See Comments)    "Made me act out  of my mind" Other reaction(s): Other (See Comments) Mental status changes hallucinations    Social History:   Social History   Socioeconomic History  . Marital status: Married    Spouse name: Not on file  . Number of children: Not on file  . Years of education: Not on file  . Highest education level: Not on file  Occupational History  . Not on file  Social Needs  . Financial resource strain: Not on file  . Food insecurity    Worry: Not on file    Inability: Not on file  . Transportation needs    Medical: Not on file    Non-medical: Not on file  Tobacco Use  . Smoking status: Former Smoker    Packs/day: 2.00    Years: 30.00    Pack years: 60.00    Types: Cigarettes    Start date: 03/28/1953    Quit date: 03/29/1983    Years since quitting: 35.6  . Smokeless tobacco: Never Used  Substance and Sexual Activity  . Alcohol use: Not Currently    Comment: 05/18/2017 'I drink a beer q yr"  . Drug use: No  . Sexual activity: Not on file  Lifestyle  . Physical activity    Days per week: Not on file    Minutes per session:  Not on file  . Stress: Not on file  Relationships  . Social Herbalist on phone: Not on file    Gets together: Not on file    Attends religious service: Not on file    Active member of club or organization: Not on file    Attends meetings of clubs or organizations: Not on file    Relationship status: Not on file  . Intimate partner violence    Fear of current or ex partner: Not on file    Emotionally abused: Not on file    Physically abused: Not on file    Forced sexual activity: Not on file  Other Topics Concern  . Not on file  Social History Narrative  . Not on file    Family History:   The patient's family history includes COPD in his father and mother; CVA in his sister; Diabetes in his mother and sister; Heart attack (age of onset: 51) in his father; Heart disease in his mother; Hypertension in his sister; Multiple sclerosis in his sister.  ROS:  Please see the history of present illness.  Hearing loss.  All other ROS reviewed and negative.     Physical Exam/Data:   Vitals:   11/26/18 1230 11/26/18 1252 11/26/18 1430 11/26/18 1500  BP: (!) 98/54 94/81  (!) 108/53  Pulse: (!) 34 (!) 48  (!) 46  Resp: (!) 21 17  20   Temp:      TempSrc:      SpO2: 100% 100%  92%  Weight:   89.7 kg   Height:   5\' 7"  (1.702 m)    No intake or output data in the 24 hours ending 11/26/18 1514 Filed Weights   11/26/18 1000 11/26/18 1430  Weight: 89.8 kg 89.7 kg   Body mass index is 30.97 kg/m.   Gen: Elderly male, no acute distress. HEENT: Conjunctiva and lids normal, oropharynx clear. Neck: Supple, no elevated JVP or carotid bruits, no thyromegaly. Lungs: Clear to auscultation, nonlabored breathing at rest. Cardiac: Irregularly irregular, no S3, 2/6 systolic murmur. Abdomen: Soft, nontender, bowel sounds present. Extremities: No pitting edema, distal pulses 2+. Skin:  Warm and dry. Musculoskeletal: No kyphosis. Neuropsychiatric: Alert and oriented x3, affect grossly  appropriate.  EKG:  An ECG dated 11/26/2018 was personally reviewed today and demonstrated:  Slow atrial fibrillation at 48 bpm.  Telemetry:  I personally reviewed telemetry which shows atrial fibrillation.  Relevant CV Studies:  TEE 09/18/2017: Left ventricle:  LVEF is normal.  ------------------------------------------------------------------- Aortic valve:  AV is mildly thickened, calcified No definite vegetation though cannot exclude colonization. No AI.  ------------------------------------------------------------------- Aorta:  Mld fixed plaquing of the thoracic aorta.  ------------------------------------------------------------------- Mitral valve:  MV is normal Trace MR  ------------------------------------------------------------------- Left atrium:  MIld spontaneous swirling contrast in LA  No evidence of thrombus in the atrial cavity or appendage.  ------------------------------------------------------------------- Atrial septum:  No PFO by color doppler or as tested with injection of agitated saline.  ------------------------------------------------------------------- Pulmonic valve:   PV is normall  ------------------------------------------------------------------- Tricuspid valve:  TV is normal. Mild TR.   ------------------------------------------------------------------- Post procedure conclusions Ascending Aorta:  - Mld fixed plaquing of the thoracic aorta.  Laboratory Data:  Chemistry Recent Labs  Lab 11/26/18 1036  NA 139  K 3.9  CL 101  CO2 26  GLUCOSE 82  BUN 28*  CREATININE 1.36*  CALCIUM 9.1  GFRNONAA 48*  GFRAA 55*  ANIONGAP 12    Recent Labs  Lab 11/26/18 1036  PROT 7.1  ALBUMIN 4.1  AST 21  ALT 15  ALKPHOS 64  BILITOT 0.7   Hematology Recent Labs  Lab 11/26/18 1036  WBC 7.3  RBC 3.61*  HGB 8.2*  HCT 27.9*  MCV 77.3*  MCH 22.7*  MCHC 29.4*  RDW 23.0*  PLT 193   Cardiac Enzymes Recent Labs  Lab  11/26/18 1036 11/26/18 1258  TROPONINIHS 10 9   BNP Recent Labs  Lab 11/26/18 1036  BNP 428.0*     Radiology/Studies:  Dg Chest Port 1 View  Result Date: 11/26/2018 CLINICAL DATA:  Shortness of breath and weakness. EXAM: PORTABLE CHEST 1 VIEW COMPARISON:  Ribs and chest x-ray 12/02/2017 FINDINGS: Heart size is normal. Mild pulmonary vascular congestion is present. There is mild atelectasis at the bases. Small effusions are present. Minimal atelectasis is present at both bases. No other significant consolidation is present. IMPRESSION: 1. Cardiomegaly with mild pulmonary vascular congestion and small effusions consistent with congestive heart failure. Electronically Signed   By: San Morelle M.D.   On: 11/26/2018 11:20    Assessment and Plan:   1.  Bradycardia, currently in slow atrial fibrillation of uncertain duration on Toprol-XL 100 mg daily and Eliquis for stroke prophylaxis. CHADSVASC score is 4.  He likely has underlying conduction system disease with prior history of symptomatic bradycardia in sinus rhythm as well.  2.  Heme positive stools with suspected GI bleed and symptomatic anemia.  Hemoglobin is 8.2 at this time.  He apparently received 2 units of packed red cells during admission at [facility] in mid August.  3.  CAD status post DES to the first diagonal and DES x2 to the LAD in February 2019.  He has been on Plavix in addition to Eliquis as an outpatient.  4.  Acute on chronic diastolic heart failure.  Outpatient medications include Lasix at 80 mg twice daily.  Chest x-ray shows vascular congestion with small pleural effusions and his BNP is moderately elevated.  5.  CKD stage III, creatinine 1.36.  Would hold Toprol-XL and observe heart rate and rhythm on telemetry.  I expect he will need a lower dose of AV  nodal blocker with underlying atrial fibrillation, and this can be resumed as needed.  Agree with holding Eliquis and Plavix in anticipation of  endoscopy.  Continue statin therapy.  Will transition to lower dose IV Lasix, follow-up urine output and BMET in a.m.  Signed, Rozann Lesches, MD  11/26/2018 3:14 PM

## 2018-11-26 NOTE — ED Provider Notes (Signed)
[facility] EMERGENCY DEPARTMENT Provider Note   CSN: [id number] Arrival date & time: 11/26/18  [id number]     History   Chief Complaint Chief Complaint  Patient presents with  . Rectal Bleeding    HPI Mike Wright is a 85 y.o. male.     HPI  Pt was seen at 1025. Per pt, c/o gradual onset and persistence of constant generalized weakness/fatigue for the past 3 months, worse over the past few weeks. Has been associated with SOB. Pt states he "usually feels this way when my blood count is low." Pt endorses year long hx of anemia due to GIB from unknown source. Pt states he was admitted to [facility] 11/05/18 and transfused 2 units PRBC's and "told to double Eliquis dose." Pt states he was evaluated by his GI Dr. Laural Golden today, told his stool was heme+, then sent to the ED for further evaluation/admission for anemia (endoscopy). Pt states he weighs himself daily and his weight has been stable. Denies CP/palpitations, no cough, no fevers, no abd pain, no N/V/D, no visible black or blood in stools, no hematuria/dysuria.      Past Medical History:  Diagnosis Date  . Anemia    a. mild, noted 04/2017.  [name] CAD in native artery    a. [name] 04/2017 s/p DES to D1, DES to prox-mid LAD, DES to prox LAD overlapping the prior stent, LVEF 55-65%.   . Chronic diastolic CHF (congestive heart failure) ([place])   . Diabetic ulcer of toe ([place])   . DJD (degenerative joint disease) of cervical spine   . Essential hypertension, benign   . GERD (gastroesophageal reflux disease)   . History of hiatal hernia   . HIT (heparin-induced thrombocytopenia) ([place])   . Hypothyroidism   . Hypoxia    a. went home on home O2 04/2017.  [name] Insomnia   . Mixed hyperlipidemia   . PAD (peripheral artery disease) ([place])   . PAF (paroxysmal atrial fibrillation) ([place])   . PVD (peripheral vascular disease) ([place])   . Renal insufficiency   . 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  . Sinus bradycardia    a. HR 30s-40s in 04/2017  -> diltiazem stopped, metoprolol reduced.  . Sleep apnea    "chose not to order CPAP at this time" (05/18/2017)  . Type 2 diabetes mellitus ([place])   . Wheezing    a. suspected COPD 04/2017. Former tobacco x 40 years.    Patient Active Problem List   Diagnosis Date Noted  . Bradycardia 11/26/2018  . Abdominal pain, epigastric 11/26/2018  . PAD (peripheral artery disease) ([place]) 12/13/2017  . Non-healing surgical wound 12/13/2017  . Infected surgical wound 12/13/2017  . Paroxysmal atrial flutter ([place])   . Dyspnea 12/02/2017  . Pancytopenia ([place]) 12/02/2017  . Coronary artery disease involving native coronary artery of native heart with angina pectoris ([place]) 11/20/2017  . Lower abdominal pain   . Nausea without vomiting   . Anemia 11/18/2017  . Volume overload 11/18/2017  . Chest pain 11/17/2017  . Gangrene ([place])   . Urinary retention 09/16/2017  . MRSA bacteremia 09/15/2017  . Gangrene of bilateral great toes ([place]) 09/13/2017  . COPD (chronic obstructive pulmonary disease) ([place]) 05/19/2017  . Iron deficiency anemia due to chronic blood loss   . CAD S/P percutaneous coronary angioplasty   . Chronic diastolic CHF (congestive heart failure) ([place])   . PSVT (paroxysmal supraventricular tachycardia) ([place])   . PAF (paroxysmal atrial fibrillation) ([place])   .  Sleep apnea   . Type 2 diabetes mellitus ([place])   . Status post coronary artery stent placement   . Essential hypertension 04/15/2010  . Diabetes ([place]) 04/14/2010    Past Surgical History:  Procedure Laterality Date  . ABDOMINAL AORTOGRAM W/LOWER EXTREMITY N/A 09/15/2017   Procedure: ABDOMINAL AORTOGRAM W/LOWER EXTREMITY;  Surgeon: Serafina Mitchell, MD;  Location: [place] [facility];  Service: Cardiovascular;  Laterality: N/A;  . AMPUTATION Bilateral 09/20/2017   Procedure: BILATERAL GREAT TOE AMPUTATIONS INCLUDING METATARSAL HEADS;  Surgeon: Serafina Mitchell, MD;  Location: [facility];  Service: Vascular;  Laterality: Bilateral;  .  ANGIOPLASTY Right 09/20/2017   Procedure: ANGIOPLASTY RIGHT TIBIAL ARTERY;  Surgeon: Serafina Mitchell, MD;  Location: [place];  Service: Vascular;  Laterality: Right;  . APPENDECTOMY    . BACK SURGERY    . CARDIAC CATHETERIZATION  1980s; 2012;  . CATARACT EXTRACTION Left 2004  . COLONOSCOPY  2004   FLEISHMAN TICS  . CORONARY ANGIOPLASTY WITH STENT PLACEMENT  05/18/2017   "3 stents"  . CORONARY STENT INTERVENTION N/A 05/18/2017   Procedure: CORONARY STENT INTERVENTION;  Surgeon: Jettie Booze, MD;  Location: [facility] [facility];  Service: Cardiovascular;  Laterality: N/A;  . ESOPHAGOGASTRODUODENOSCOPY (EGD) WITH PROPOFOL N/A 11/20/2017   Procedure: ESOPHAGOGASTRODUODENOSCOPY (EGD) WITH PROPOFOL;  Surgeon: Irene Shipper, MD;  Location: [place];  Service: Gastroenterology;  Laterality: N/A;  . I&D EXTREMITY Right 12/17/2017   Procedure: IRRIGATION AND DEBRIDEMENT RIGHT GREAT TOE;  Surgeon: Serafina Mitchell, MD;  Location: [facility];  Service: Vascular;  Laterality: Right;  . JOINT REPLACEMENT    . LEFT HEART CATH AND CORONARY ANGIOGRAPHY N/A 05/18/2017   Procedure: LEFT HEART CATH AND CORONARY ANGIOGRAPHY;  Surgeon: Jettie Booze, MD;  Location: [place] [facility];  Service: Cardiovascular;  Laterality: N/A;  . LOWER EXTREMITY ANGIOGRAM Right 09/20/2017   Procedure: RIGHT LOWER LEG  ANGIOGRAM;  Surgeon: Serafina Mitchell, MD;  Location: [place];  Service: Vascular;  Laterality: Right;  . LUMBAR FUSION  2002   L3, 4 L4, 5 L5 S1 Fused by Dr. Glenna Fellows  . PERIPHERAL VASCULAR BALLOON ANGIOPLASTY Left 09/15/2017   Procedure: PERIPHERAL VASCULAR BALLOON ANGIOPLASTY;  Surgeon: Serafina Mitchell, MD;  Location: [place] [facility];  Service: Cardiovascular;  Laterality: Left;  PTA of Peroneal & Posterior Tibial  . POSTERIOR LUMBAR FUSION    . RHINOPLASTY    . RIGHT HEART CATH N/A 05/18/2017   Procedure: RIGHT HEART CATH;  Surgeon: Jettie Booze, MD;  Location: [place] [facility];  Service:  Cardiovascular;  Laterality: N/A;  . TEE WITHOUT CARDIOVERSION N/A 09/18/2017   Procedure: TRANSESOPHAGEAL ECHOCARDIOGRAM (TEE);  Surgeon: Fay Records, MD;  Location: [facility] ENDOSCOPY;  Service: Cardiovascular;  Laterality: N/A;  . TOTAL KNEE ARTHROPLASTY Bilateral   . TRANSURETHRAL RESECTION OF [place] Medications    Prior to Admission medications   Medication Sig Start Date End Date Taking? Authorizing Provider  acetaminophen (TYLENOL) 500 MG tablet Take 500 mg by mouth as needed for mild pain or headache.    [provider]  amLODipine (NORVASC) 10 MG tablet TAKE 1 TABLET EVERY DAY (DOSE INCREASE. DISCONTINUE AMLODIPINE 5MG  AND DILTIAZEM) 03/16/18   Branch, Alphonse Guild, MD  apixaban (ELIQUIS) 5 MG TABS tablet Take 1 tablet (5 mg total) by mouth 2 (two) times daily. 01/22/18   Arnoldo Lenis, MD  atorvastatin (  LIPITOR) 80 MG tablet Take 1 tablet (80 mg total) by mouth every evening. 05/19/17   Charlie Pitter, PA-C  Cholecalciferol (D3-1000 PO) Take 1,000 Units by mouth daily.     [provider]  Cyanocobalamin (B-12 PO) Take 1 tablet by mouth daily.     [provider]  DROPLET INSULIN SYRINGE 30G X 1/2" 1 ML MISC  01/15/18   [provider]  folic acid (FOLVITE) 1 MG tablet Take 1 tablet (1 mg total) by mouth daily. 12/06/17   Oswald Hillock, MD  furosemide (LASIX) 40 MG tablet Take 40 mg by mouth. Take 40 mg in the morning and 40 mg in the evening    [provider]  insulin glargine (LANTUS) 100 UNIT/ML injection Inject 30-40 Units into the skin See admin instructions. Inject 40 units SQ in the morning and inject 30 units SQ at bedtime    [provider]  isosorbide mononitrate (IMDUR) 30 MG 24 hr tablet Take 0.5 tablets (15 mg total) by mouth daily. 04/04/18 07/27/18  Arnoldo Lenis, MD  lisinopril (PRINIVIL,ZESTRIL) 5 MG tablet Take 5 mg by mouth daily.  01/17/18   [provider]  meclizine  (ANTIVERT) 12.5 MG tablet Take 1 tablet (12.5 mg total) by mouth 3 (three) times daily as needed for dizziness. 05/30/17   Richardson Dopp T, PA-C  metFORMIN (GLUCOPHAGE) 1000 MG tablet Take 1,000 mg by mouth 2 (two) times daily. 05/22/17   [provider]  metoprolol succinate (TOPROL-XL) 100 MG 24 hr tablet Take 1 tablet (100 mg total) by mouth daily. 12/05/17   Oswald Hillock, MD  metoprolol tartrate (LOPRESSOR) 100 MG tablet  01/17/18   [provider]  nitroGLYCERIN (NITROSTAT) 0.4 MG SL tablet PLACE 1 TABLET (0.4 MG TOTAL) UNDER THE TONGUE EVERY 5  MINUTES AS NEEDED FOR CHEST PAIN. Patient taking differently: Place 0.4 mg under the tongue every 5 (five) minutes as needed.  05/22/17   Arnoldo Lenis, MD  pantoprazole (PROTONIX) 40 MG tablet Take 1 tablet (40 mg total) by mouth daily. To protect stomach while taking multiple blood thinners. 11/22/17   Cheryln Manly, NP  psyllium (METAMUCIL) 58.6 % packet Take 1 packet by mouth daily.    [provider]  traZODone (DESYREL) 100 MG tablet Take 100 mg by mouth at bedtime.  03/25/16   [provider]    Family History Family History  Problem Relation Age of Onset  . Heart attack Father 96  . COPD Father   . COPD Mother   . Heart disease Mother   . Diabetes Mother   . Hypertension Sister   . CVA Sister   . Diabetes Sister   . Multiple sclerosis Sister     Social History Social History   Tobacco Use  . Smoking status: Former Smoker    Packs/day: 2.00    Years: 30.00    Pack years: 60.00    Types: Cigarettes    Start date: 03/28/1953    Quit date: 03/29/1983    Years since quitting: 35.6  . Smokeless tobacco: Never Used  Substance Use Topics  . Alcohol use: Not Currently    Comment: 05/18/2017 'I drink a beer q yr"  . Drug use: No     Allergies   Bactrim [sulfamethoxazole-trimethoprim], Codeine, Heparin, Losartan, Other, and Oxycodone   Review of Systems Review of Systems ROS: Statement:  All systems negative except as marked or noted in the HPI; Constitutional: Negative for  fever and chills. +generalized weakness/fatigue. ; ; Eyes: Negative for eye pain, redness and discharge. ; ; ENMT: Negative for ear pain, hoarseness, nasal congestion, sinus pressure and sore throat. ; ; Cardiovascular: Negative for chest pain, palpitations, diaphoresis, and peripheral edema. ; ; Respiratory: +SOB. Negative for cough, wheezing and stridor. ; ; Gastrointestinal: +heme+ stools. Negative for nausea, vomiting, diarrhea, abdominal pain, blood in stool, hematemesis, jaundice and rectal bleeding. . ; ; Genitourinary: Negative for dysuria, flank pain and hematuria. ; ; Musculoskeletal: Negative for back pain and neck pain. Negative for swelling and trauma.; ; Skin: Negative for pruritus, rash, abrasions, blisters, bruising and skin lesion.; ; Neuro: Negative for headache, lightheadedness and neck stiffness. Negative for altered level of consciousness, altered mental status, extremity weakness, paresthesias, involuntary movement, seizure and syncope.       Physical Exam Updated Vital Signs BP 107/70   Pulse (!) 135   Temp 97.8 F (36.6 C) (Oral)   Resp (!) 23   Ht 5\' 7"  (1.702 m)   Wt 89.8 kg   SpO2 99%   BMI 31.01 kg/m    Patient Vitals for the past 24 hrs:  BP Temp Temp src Pulse Resp SpO2 Height Weight  11/26/18 1130 107/70 - - (!) 135 (!) 23 99 % - -  11/26/18 1000 (!) 134/46 97.8 F (36.6 C) Oral (!) 105 18 98 % 5\' 7"  (1.702 m) 89.8 kg    11:38 Orthostatic Vital Signs FS  Orthostatic Lying   BP- Lying: 101/56  Pulse- Lying: 46Abnormal       Orthostatic Sitting  BP- Sitting: 113/62  Pulse- Sitting: 53      Orthostatic Standing at 0 minutes  BP- Standing at 0 minutes: 103/51 (pt dizzy and swaying. almost fell due to dizziness. )  Pulse- Standing at 0 minutes: 66      Physical Exam 1030: Physical examination:  Nursing notes reviewed; Vital signs and O2 SAT reviewed;   Constitutional: Well developed, Well nourished, Well hydrated, In no acute distress; Head:  Normocephalic, atraumatic; Eyes: EOMI, PERRL, No scleral icterus; ENMT: Mouth and pharynx normal, Mucous membranes moist; Neck: Supple, Full range of motion, No lymphadenopathy; Cardiovascular: Irregular bradycardic rate and rhythm, No gallop; Respiratory: Breath sounds clear & equal bilaterally, No wheezes.  Speaking full sentences with ease, Normal respiratory effort/excursion; Chest: Nontender, Movement normal; Abdomen: Soft, Nontender, Nondistended, Normal bowel sounds; Genitourinary: No CVA tenderness; Extremities: Peripheral pulses normal, No tenderness, +2 pedal edema bilat. No calf tenderness or asymmetry.; Neuro: AA&Ox3, Major CN grossly intact.  Speech clear. No gross focal motor or sensory deficits in extremities.; Skin: Color normal, Warm, Dry.    ED Treatments / Results  Labs (all labs ordered are listed, but only abnormal results are displayed)   EKG EKG Interpretation  Date/Time:  Monday November 26 2018 10:12:55 EDT  #1 Ventricular Rate:  47 PR Interval:    QRS Duration: 98 QT Interval:  479 QTC Calculation: 424 R Axis:   42 Text Interpretation:  Atrial fibrillation Borderline T abnormalities, lateral leads Baseline wander When compared with ECG of 12/03/2017 Atrial fibrillation has replaced Normal sinus rhythm Confirmed by Francine Graven [phone]) on 11/26/2018 10:39:20 AM    EKG Interpretation  Date/Time:  Monday November 26 2018 12:07:12 EDT #2 Ventricular Rate:  48 PR Interval:    QRS Duration: 95 QT Interval:  516 QTC Calculation: 462 R Axis:   35 Text Interpretation:  Atrial fibrillation Since last tracing of earlier today No significant change was found  Confirmed by Francine Graven [phone]) on 11/26/2018 12:20:08 PM        Radiology   Procedures Procedures (including critical care time)  Medications Ordered in ED Medications - No data to display   Initial  Impression / Assessment and Plan / ED Course  I have reviewed the triage vital signs and the nursing notes.  Pertinent labs & imaging results that were available during my care of the patient were reviewed by me and considered in my medical decision making (see chart for details).     MDM Reviewed: previous chart, nursing note and vitals Reviewed previous: labs and ECG Interpretation: labs, ECG and x-ray   Results for orders placed or performed during the hospital encounter of 11/26/18  Comprehensive metabolic panel  Result Value Ref Range   Sodium 139 135 - 145 mmol/L   Potassium 3.9 3.5 - 5.1 mmol/L   Chloride 101 98 - 111 mmol/L   CO2 26 22 - 32 mmol/L   Glucose, Bld 82 70 - 99 mg/dL   BUN 28 (H) 8 - 23 mg/dL   Creatinine, Ser 1.36 (H) 0.61 - 1.24 mg/dL   Calcium 9.1 8.9 - 10.3 mg/dL   Total Protein 7.1 6.5 - 8.1 g/dL   Albumin 4.1 3.5 - 5.0 g/dL   AST 21 15 - 41 U/L   ALT 15 0 - 44 U/L   Alkaline Phosphatase 64 38 - 126 U/L   Total Bilirubin 0.7 0.3 - 1.2 mg/dL   GFR calc non Af Amer 48 (L) >60 mL/min   GFR calc Af Amer 55 (L) >60 mL/min   Anion gap 12 5 - 15  Lipase, blood  Result Value Ref Range   Lipase 29 11 - 51 U/L  Brain natriuretic peptide  Result Value Ref Range   B Natriuretic Peptide 428.0 (H) 0.0 - 100.0 pg/mL  CBC with Differential  Result Value Ref Range   WBC 7.3 4.0 - 10.5 K/uL   RBC 3.61 (L) 4.22 - 5.81 MIL/uL   Hemoglobin 8.2 (L) 13.0 - 17.0 g/dL   HCT 27.9 (L) 39.0 - 52.0 %   MCV 77.3 (L) 80.0 - 100.0 fL   MCH 22.7 (L) 26.0 - 34.0 pg   MCHC 29.4 (L) 30.0 - 36.0 g/dL   RDW 23.0 (H) 11.5 - 15.5 %   Platelets 193 150 - 400 K/uL   nRBC 0.0 0.0 - 0.2 %   Neutrophils Relative % 63 %   Neutro Abs 4.6 1.7 - 7.7 K/uL   Lymphocytes Relative 13 %   Lymphs Abs 1.0 0.7 - 4.0 K/uL   Monocytes Relative 22 %   Monocytes Absolute 1.6 (H) 0.1 - 1.0 K/uL   Eosinophils Relative 1 %   Eosinophils Absolute 0.1 0.0 - 0.5 K/uL   Basophils Relative 1 %    Basophils Absolute 0.0 0.0 - 0.1 K/uL   Immature Granulocytes 0 %   Abs Immature Granulocytes 0.03 0.00 - 0.07 K/uL  Type and screen [facility]  Result Value Ref Range   ABO/RH(D) O POS    Antibody Screen NEG    Sample Expiration      11/29/2018,2359 Performed at [facility], [address], [place], [place] [address]   Troponin I (High Sensitivity)  Result Value Ref Range   Troponin I (High Sensitivity) 10 <18 ng/L   Dg Chest Port 1 View Result Date: 11/26/2018 CLINICAL DATA:  Shortness of breath and weakness. EXAM: PORTABLE CHEST 1 VIEW COMPARISON:  Ribs  and chest x-ray 12/02/2017 FINDINGS: Heart size is normal. Mild pulmonary vascular congestion is present. There is mild atelectasis at the bases. Small effusions are present. Minimal atelectasis is present at both bases. No other significant consolidation is present. IMPRESSION: 1. Cardiomegaly with mild pulmonary vascular congestion and small effusions consistent with congestive heart failure. Electronically Signed   By: San Morelle M.D.   On: 11/26/2018 11:20     MALCOLM STANHOPE was evaluated in Emergency Department on 11/26/2018 for the symptoms described in the history of present illness. He was evaluated in the context of the global COVID-19 pandemic, which necessitated consideration that the patient might be at risk for infection with the SARS-CoV-2 virus that causes COVID-19. Institutional protocols and algorithms that pertain to the evaluation of patients at risk for COVID-19 are in a state of rapid change based on information released by regulatory bodies including the CDC and federal and state organizations. These policies and algorithms were followed during the patient's care in the ED.    1210:  Pt's HR dipping to 37, afib on monitor. Pt does not appear to become tachycardic however, with HR increasing to 50's while I am at bedside/watching monitor. Pt orthostatic on VS. H/H per baseline range. Pt continues to  mentate per baseline, appears NAD on stretcher, resps without distress, abd remains benign.  T/C returned from [facility] Dr. Domenic Polite, case discussed, including:  HPI, pertinent PM/SHx, VS/PE, dx testing, ED course and treatment:  No acute treatment for bradycardia at this time, pt will need to have metoprolol held while monitoring HR on tele floor.   1225:  T/C returned from [facility] Dr. Manuella Ghazi, case discussed, including:  HPI, pertinent PM/SHx, VS/PE, dx testing, ED course and treatment, as well as d/w Cards MD:  Agreeable to admit.     Final Clinical Impressions(s) / ED Diagnoses   Final diagnoses:  None    ED Discharge Orders    None       Francine Graven, DO 11/29/18 1534

## 2018-11-26 NOTE — ED Triage Notes (Signed)
Pt reports going to Dr. Olevia Perches office this morning and had positive hemacult so was sent here for further eval. Was referred to Dr. Laural Golden by his pcp for rectal bleeding. Was transfused 2 units August 10th.

## 2018-11-27 DIAGNOSIS — I4821 Permanent atrial fibrillation: Secondary | ICD-10-CM

## 2018-11-27 DIAGNOSIS — D500 Iron deficiency anemia secondary to blood loss (chronic): Secondary | ICD-10-CM

## 2018-11-27 DIAGNOSIS — I5032 Chronic diastolic (congestive) heart failure: Secondary | ICD-10-CM

## 2018-11-27 LAB — IRON AND TIBC
Iron: 24 ug/dL — ABNORMAL LOW (ref 45–182)
Saturation Ratios: 6 % — ABNORMAL LOW (ref 17.9–39.5)
TIBC: 407 ug/dL (ref 250–450)
UIBC: 383 ug/dL

## 2018-11-27 LAB — BASIC METABOLIC PANEL
Anion gap: 11 (ref 5–15)
BUN: 31 mg/dL — ABNORMAL HIGH (ref 8–23)
CO2: 28 mmol/L (ref 22–32)
Calcium: 8.8 mg/dL — ABNORMAL LOW (ref 8.9–10.3)
Chloride: 100 mmol/L (ref 98–111)
Creatinine, Ser: 1.29 mg/dL — ABNORMAL HIGH (ref 0.61–1.24)
GFR calc Af Amer: 59 mL/min — ABNORMAL LOW (ref >60)
GFR calc non Af Amer: 51 mL/min — ABNORMAL LOW (ref >60)
Glucose, Bld: 46 mg/dL — ABNORMAL LOW (ref 70–99)
Potassium: 3.2 mmol/L — ABNORMAL LOW (ref 3.5–5.1)
Sodium: 139 mmol/L (ref 135–145)

## 2018-11-27 LAB — CBC
HCT: 26.4 % — ABNORMAL LOW (ref 39.0–52.0)
Hemoglobin: 7.8 g/dL — ABNORMAL LOW (ref 13.0–17.0)
MCH: 22.7 pg — ABNORMAL LOW (ref 26.0–34.0)
MCHC: 29.5 g/dL — ABNORMAL LOW (ref 30.0–36.0)
MCV: 77 fL — ABNORMAL LOW (ref 80.0–100.0)
Platelets: 176 10*3/uL (ref 150–400)
RBC: 3.43 MIL/uL — ABNORMAL LOW (ref 4.22–5.81)
RDW: 23 % — ABNORMAL HIGH (ref 11.5–15.5)
WBC: 6.3 10*3/uL (ref 4.0–10.5)
nRBC: 0 % (ref 0.0–0.2)

## 2018-11-27 LAB — FERRITIN: Ferritin: 11 ng/mL — ABNORMAL LOW (ref 24–336)

## 2018-11-27 LAB — GLUCOSE, CAPILLARY
Glucose-Capillary: 107 mg/dL — ABNORMAL HIGH (ref 70–99)
Glucose-Capillary: 135 mg/dL — ABNORMAL HIGH (ref 70–99)
Glucose-Capillary: 148 mg/dL — ABNORMAL HIGH (ref 70–99)
Glucose-Capillary: 151 mg/dL — ABNORMAL HIGH (ref 70–99)
Glucose-Capillary: 45 mg/dL — ABNORMAL LOW (ref 70–99)
Glucose-Capillary: 48 mg/dL — ABNORMAL LOW (ref 70–99)
Glucose-Capillary: 97 mg/dL (ref 70–99)

## 2018-11-27 LAB — PREPARE RBC (CROSSMATCH)

## 2018-11-27 LAB — HEMOGLOBIN AND HEMATOCRIT, BLOOD
HCT: 30.9 % — ABNORMAL LOW (ref 39.0–52.0)
Hemoglobin: 9.3 g/dL — ABNORMAL LOW (ref 13.0–17.0)

## 2018-11-27 LAB — MRSA PCR SCREENING: MRSA by PCR: POSITIVE — AB

## 2018-11-27 LAB — ABO/RH: ABO/RH(D): O POS

## 2018-11-27 LAB — MAGNESIUM: Magnesium: 2.1 mg/dL (ref 1.7–2.4)

## 2018-11-27 MED ORDER — FUROSEMIDE 10 MG/ML IJ SOLN
60.0000 mg | Freq: Two times a day (BID) | INTRAMUSCULAR | Status: DC
  Administered 2018-11-27: 60 mg via INTRAVENOUS
  Filled 2018-11-27: qty 6

## 2018-11-27 MED ORDER — SODIUM CHLORIDE 0.9% IV SOLUTION
Freq: Once | INTRAVENOUS | Status: AC
  Administered 2018-11-27: 09:00:00 via INTRAVENOUS

## 2018-11-27 MED ORDER — DEXTROSE 50 % IV SOLN
25.0000 g | INTRAVENOUS | Status: AC
  Administered 2018-11-27: 25 g via INTRAVENOUS

## 2018-11-27 MED ORDER — ORAL CARE MOUTH RINSE
15.0000 mL | Freq: Two times a day (BID) | OROMUCOSAL | Status: DC
  Administered 2018-11-28 (×2): 15 mL via OROMUCOSAL

## 2018-11-27 MED ORDER — POTASSIUM CHLORIDE CRYS ER 20 MEQ PO TBCR
40.0000 meq | EXTENDED_RELEASE_TABLET | Freq: Once | ORAL | Status: AC
  Administered 2018-11-27: 09:00:00 40 meq via ORAL
  Filled 2018-11-27: qty 2

## 2018-11-27 MED ORDER — DEXTROSE 5 % IV SOLN
Freq: Once | INTRAVENOUS | Status: AC
  Administered 2018-11-27: 06:00:00 via INTRAVENOUS

## 2018-11-27 MED ORDER — MUPIROCIN 2 % EX OINT
1.0000 "application " | TOPICAL_OINTMENT | Freq: Two times a day (BID) | CUTANEOUS | Status: DC
  Administered 2018-11-27 – 2018-11-28 (×4): 1 via NASAL
  Filled 2018-11-27 (×2): qty 22

## 2018-11-27 MED ORDER — FUROSEMIDE 10 MG/ML IJ SOLN
40.0000 mg | Freq: Two times a day (BID) | INTRAMUSCULAR | Status: DC
  Administered 2018-11-27: 09:00:00 40 mg via INTRAVENOUS
  Filled 2018-11-27: qty 4

## 2018-11-27 MED ORDER — POTASSIUM CHLORIDE CRYS ER 20 MEQ PO TBCR
40.0000 meq | EXTENDED_RELEASE_TABLET | Freq: Every day | ORAL | Status: DC
  Administered 2018-11-27 – 2018-11-29 (×3): 40 meq via ORAL
  Filled 2018-11-27 (×3): qty 2

## 2018-11-27 NOTE — Progress Notes (Signed)
Database and subjective:  Patient is 85 year old Caucasian male with multiple medical problems including coronary artery disease chronic CHF atrial fibrillation diabetes mellitus who was seen in our office yesterday for anemia and chronic GI bleed.  His stool was brown but heme positive.  His heart rate was 40.  He has chronic A. fib.  He was very weak.  He was therefore referred to emergency room.  Patient was evaluated and admitted to ICU.  Patient states he had 2 units of PRBCs but 3 weeks ago at [facility] and he tells me that he also had transfusion about 5 months ago when his hemoglobin was down to 5.   This morning patient complains of shortness of breath.  He denies chest pain.  He has had intermittent heartburn for which she is taking pantoprazole.  He denies dysphagia nausea or vomiting.  He reports change in his bowel habits about 3 months ago.  He says he has had diarrhea he also has mild abdominal pain mainly on the left side.  No history of melena or rectal bleeding.  He says his appetite is not good and he has lost 30 pounds over the last few months.  No history of peptic ulcer disease.  He tells me his last colonoscopy was in 2004 with removal of few polyps.  He has not had colonoscopy since then. Patient states his older sister who is diseased had 13 inches of her colon taken out in his younger sister underwent colonic surgery with colostomy.  She is now in a nursing home with dementia.  Patient is not sure if his sisters had diverticular disease or colon carcinoma.   Current Facility-Administered Medications:  .  0.9 %  sodium chloride infusion (Manually program via Guardrails IV Fluids), , Intravenous, Once, Manuella Ghazi, Pratik D, DO .  0.9 %  sodium chloride infusion, 250 mL, Intravenous, PRN, Manuella Ghazi, Pratik D, DO .  acetaminophen (TYLENOL) tablet 650 mg, 650 mg, Oral, Q6H PRN, 650 mg at 11/27/18 0023 **OR** acetaminophen (TYLENOL) suppository 650 mg, 650 mg, Rectal, Q6H PRN, Manuella Ghazi, Pratik D,  DO .  atorvastatin (LIPITOR) tablet 80 mg, 80 mg, Oral, QPM, Shah, Pratik D, DO, 80 mg at 11/26/18 1700 .  Chlorhexidine Gluconate Cloth 2 % PADS 6 each, 6 each, Topical, Daily, Heath Lark D, DO, 6 each at 11/26/18 1459 .  furosemide (LASIX) injection 40 mg, 40 mg, Intravenous, Q12H, Shah, Pratik D, DO .  insulin aspart (novoLOG) injection 0-9 Units, 0-9 Units, Subcutaneous, Q6H, Shah, Pratik D, DO .  [START ON 11/28/2018] MEDLINE mouth rinse, 15 mL, Mouth Rinse, BID, Manuella Ghazi, Pratik D, DO .  mupirocin ointment (BACTROBAN) 2 % 1 application, 1 application, Nasal, BID, Shah, Pratik D, DO .  ondansetron (ZOFRAN) tablet 4 mg, 4 mg, Oral, Q6H PRN **OR** ondansetron (ZOFRAN) injection 4 mg, 4 mg, Intravenous, Q6H PRN, Manuella Ghazi, Pratik D, DO .  pantoprazole (PROTONIX) injection 40 mg, 40 mg, Intravenous, Q12H, Shah, Pratik D, DO, 40 mg at 11/26/18 2149 .  potassium chloride SA (K-DUR) CR tablet 40 mEq, 40 mEq, Oral, Once, Manuella Ghazi, Pratik D, DO .  sodium chloride flush (NS) 0.9 % injection 3 mL, 3 mL, Intravenous, Q12H, Shah, Pratik D, DO, 3 mL at 11/26/18 2150 .  sodium chloride flush (NS) 0.9 % injection 3 mL, 3 mL, Intravenous, PRN, Manuella Ghazi, Pratik D, DO .  traZODone (DESYREL) tablet 100 mg, 100 mg, Oral, QHS, Shah, Pratik D, DO, 100 mg at 11/26/18 2149   Objective: Blood  pressure (!) 128/58, pulse (!) 53, temperature 98.1 F (36.7 C), temperature source Oral, resp. rate (!) 22, height _0  (1.702 m), weight 90.2 kg, SpO2 91 %. Patient is alert. He appears pale. JVD is elevated. Cardiac exam with regular rhythm normal S1 and S2.  He has faint systolic murmur best heard at left sternal border. Auscultation lungs reveal bibasilar rales. Abdomen is full.  Bowel sounds are normal.  He has scar in right lower quadrant.  Abdomen is soft with mild generalized tenderness.  No hepato-spinal megaly or masses. He anterior knee scars bilaterally. No LE edema noted.     Labs/studies Results:  CBC Latest Ref Rng &  Units 11/27/2018 11/26/2018 01/04/2018  WBC 4.0 - 10.5 K/uL 6.3 7.3 6.8  Hemoglobin 13.0 - 17.0 g/dL 7.8(L) 8.2(L) 11.5(L)  Hematocrit 39.0 - 52.0 % 26.4(L) 27.9(L) 36.7(L)  Platelets 150 - 400 K/uL 176 193 208    CMP Latest Ref Rng & Units 11/27/2018 11/26/2018 01/04/2018  Glucose 70 - 99 mg/dL 46(L) 82 113(H)  BUN 8 - 23 mg/dL 31(H) 28(H) 17  Creatinine 0.61 - 1.24 mg/dL 1.29(H) 1.36(H) 1.00  Sodium 135 - 145 mmol/L 139 139 141  Potassium 3.5 - 5.1 mmol/L 3.2(L) 3.9 4.6  Chloride 98 - 111 mmol/L 100 101 101  CO2 22 - 32 mmol/L _1 Calcium 8.9 - 10.3 mg/dL 8.8(L) 9.1 9.9  Total Protein 6.5 - 8.1 g/dL - 7.1 8.2(H)  Total Bilirubin 0.3 - 1.2 mg/dL - 0.7 1.0  Alkaline Phos 38 - 126 U/L - 64 97  AST 15 - 41 U/L - 21 32  ALT 0 - 44 U/L - 15 22    Hepatic Function Latest Ref Rng & Units 11/26/2018 01/04/2018 12/13/2017  Total Protein 6.5 - 8.1 g/dL 7.1 8.2(H) 7.5  Albumin 3.5 - 5.0 g/dL 4.1 3.9 2.6(L)  AST 15 - 41 U/L 21 32 31  ALT 0 - 44 U/L _2 Alk Phosphatase 38 - 126 U/L 64 97 110  Total Bilirubin 0.3 - 1.2 mg/dL 0.7 1.0 0.8  Bilirubin, Direct 0.0 - 0.2 mg/dL - - -      Assessment:  #1.  Microcytic anemia appears to be due to chronic GI blood loss.  Patient received PRBC transfusion last year when he was [facility] and  EGD was negative.  His stool is guaiac positive although he has not had melena or rectal bleeding.  Patient is on clopidogrel and apixaban which are on hold.  Given history of diarrhea and abdominal pain he could have colitis.  He is also having GERD symptoms despite taking PPI.  Patient's hemoglobin is gradually dropping and expected to drop further.  He therefore would benefit from a unit of PRBCs as discussed with Dr. Manuella Ghazi.  Transfusion will have to be slow in order not to worsen his CHF.    #2.  Bradycardia.  His heart rate in the office yesterday was 35 and now it is in his 27s.  Beta-blocker is on hold.  He has slow A. fib.  He has been followed by Dr. [facility].  #3.  Coronary artery disease.  He had stenting in February 2019.  #4.  CHF.  Patient appears to be in CHF.  He has rest dyspnea.  JVD is elevated and he also has bibasilar rales.  #5. DM.  Recommendations:  Esophagogastroduodenoscopy and colonoscopy when patient deemed to be stable from cardiac standpoint. Check serum iron TIBC and ferritin  on pretransfusion serum. Will allow full liquids today. Transfuse 1 unit of PRBCs slowly.

## 2018-11-27 NOTE — Progress Notes (Signed)
PT once again hypoglycemic. D50 amp administered. To be rechecked in 15-20 minutes. Continue to monitor

## 2018-11-27 NOTE — TOC Initial Note (Signed)
Transition of Care [facility]) - Initial/Assessment Note    Patient Details  Name: Mike Wright MRN: [id number] Date of Birth: Feb 04, 1934  Transition of Care [facility]) CM/SW Contact:    Ihor Gully, LCSW Phone Number: 11/27/2018, 5:25 PM  Clinical Narrative:                 Patient from home with spouse. Patient recently hospitalized on 11/05/2018 at [facility], received blood. Patient has home oxygen and RW.  TOC will follow through discharge and address needs as necessary.   Expected Discharge Plan: [place] Barriers to Discharge: No Barriers Identified   Patient Goals and CMS Choice        Expected Discharge Plan and Services Expected Discharge Plan: [place]                                              Prior Living Arrangements/Services     Patient language and need for interpreter reviewed:: Yes Do you feel safe going back to the place where you live?: Yes      Need for Family Participation in Patient Care: Yes (Comment) Care giver support system in place?: Yes (comment) Current home services: DME Criminal Activity/Legal Involvement Pertinent to Current Situation/Hospitalization: No - Comment as needed  Activities of Daily Living Home Assistive Devices/Equipment: Cane (specify quad or straight), Dentures (specify type), Eyeglasses, Oxygen ADL Screening (condition at time of admission) Patient's cognitive ability adequate to safely complete daily activities?: Yes Is the patient deaf or have difficulty hearing?: No Does the patient have difficulty seeing, even when wearing glasses/contacts?: No Does the patient have difficulty concentrating, remembering, or making decisions?: No Patient able to express need for assistance with ADLs?: No Does the patient have difficulty dressing or bathing?: No Independently performs ADLs?: Yes (appropriate for developmental age) Does the patient have difficulty walking or climbing stairs?:  No Weakness of Legs: Both Weakness of Arms/Hands: None  Permission Sought/Granted Permission sought to share information with : PCP                Emotional Assessment Appearance:: Appears stated age   Affect (typically observed): Unable to Assess Orientation: : Oriented to Self, Oriented to Place, Oriented to  Time, Oriented to Situation Alcohol / Substance Use: Not Applicable Psych Involvement: No (comment)  Admission diagnosis:  Orthostatic hypotension [I95.1] Bradycardia [R00.1] Heme positive stool [id number] Chronic diastolic CHF (congestive heart failure) (HCC) [I50.32] Anemia, unspecified type [D64.9] Acute blood loss anemia [D62] Patient Active Problem List   Diagnosis Date Noted  . Bradycardia 11/26/2018  . Abdominal pain, epigastric 11/26/2018  . Acute blood loss anemia 11/26/2018  . PAD (peripheral artery disease) ([place]) 12/13/2017  . Non-healing surgical wound 12/13/2017  . Infected surgical wound 12/13/2017  . Paroxysmal atrial flutter ([place])   . Dyspnea 12/02/2017  . Pancytopenia ([place]) 12/02/2017  . Coronary artery disease involving native coronary artery of native heart with angina pectoris ([place]) 11/20/2017  . Lower abdominal pain   . Nausea without vomiting   . Anemia 11/18/2017  . Volume overload 11/18/2017  . Chest pain 11/17/2017  . Gangrene ([place])   . Urinary retention 09/16/2017  . MRSA bacteremia 09/15/2017  . Gangrene of bilateral great toes ([place]) 09/13/2017  . COPD (chronic obstructive pulmonary disease) ([place]) 05/19/2017  . Iron deficiency anemia due to chronic blood  loss   . CAD S/P percutaneous coronary angioplasty   . Chronic diastolic CHF (congestive heart failure) ([place])   . PSVT (paroxysmal supraventricular tachycardia) ([place])   . PAF (paroxysmal atrial fibrillation) ([place])   . Sleep apnea   . Type 2 diabetes mellitus ([place])   . Status post coronary artery stent placement   . Essential hypertension 04/15/2010  . Diabetes ([place]) 04/14/2010    PCP:  Monico Blitz, MD Pharmacy:   [facility] - [address], [place] [address] Phone: [phone] Fax: [phone] [facility] Brooke Pace, [place] [id number] [place] [id number] Phone: [phone] Fax: [phone]     Social Determinants of Health (SDOH) Interventions    Readmission Risk Interventions No flowsheet data found.

## 2018-11-27 NOTE — Progress Notes (Signed)
PT hypoglycemic for the 3rd time this shift. D50 amp given. Recheck glucose in 15-20mins. MD notified for other interventions due to continuous hypoglycemia. Continue to monitor.

## 2018-11-27 NOTE — Progress Notes (Signed)
One time order of Dextrose 5% initiated via IV. Continue to monitor PT glucose closely. Heart rhythm now Afib in 50s.

## 2018-11-27 NOTE — Progress Notes (Addendum)
Progress Note  Patient Name: Mike Wright Date of Encounter: 11/27/2018  Primary Cardiologist: Carlyle Dolly, MD  Subjective   Feels short of breath this morning but in no distress, ate breakfast.  No chest pain or sense of palpitations.  No abdominal pain.  Inpatient Medications    Scheduled Meds: . atorvastatin  80 mg Oral QPM  . Chlorhexidine Gluconate Cloth  6 each Topical Daily  . furosemide  40 mg Intravenous Q12H  . insulin aspart  0-9 Units Subcutaneous Q6H  . [START ON 11/28/2018] mouth rinse  15 mL Mouth Rinse BID  . mupirocin ointment  1 application Nasal BID  . pantoprazole (PROTONIX) IV  40 mg Intravenous Q12H  . sodium chloride flush  3 mL Intravenous Q12H  . traZODone  100 mg Oral QHS   Continuous Infusions: . sodium chloride     PRN Meds: sodium chloride, acetaminophen **OR** acetaminophen, ondansetron **OR** ondansetron (ZOFRAN) IV, sodium chloride flush   Vital Signs    Vitals:   11/27/18 0841 11/27/18 0845 11/27/18 0859 11/27/18 0900  BP:  (!) 123/99  127/78  Pulse: 61 (!) 57 (!) 55 (!) 56  Resp:      Temp: 98.2 F (36.8 C)     TempSrc: Oral     SpO2: 91% 97% 95% 92%  Weight:      Height:        Intake/Output Summary (Last 24 hours) at 11/27/2018 0916 Last data filed at 11/27/2018 0845 Gross per 24 hour  Intake 320.96 ml  Output 1150 ml  Net -829.04 ml   Filed Weights   11/26/18 1000 11/26/18 1430 11/27/18 0430  Weight: 89.8 kg 89.7 kg 90.2 kg    Telemetry    Atrial fibrillation. Personally reviewed.  ECG    An ECG dated 11/26/2018 was personally reviewed today and demonstrated:  Slow atrial fibrillation.  Physical Exam   GEN:  Elderly male, appears comfortable at rest.   Cardiac:  Irregularly irregular, 2/6 systolic murmur, no gallop.  Respiratory:  Few crackles at bases. GI: Soft, nontender, bowel sounds present. MS: No edema; No deformity. Neuro:  Nonfocal. Psych: Alert and oriented x 3. Normal affect.  Labs     Chemistry Recent Labs  Lab 11/26/18 1036 11/27/18 0425  NA 139 139  K 3.9 3.2*  CL 101 100  CO2 26 28  GLUCOSE 82 46*  BUN 28* 31*  CREATININE 1.36* 1.29*  CALCIUM 9.1 8.8*  PROT 7.1  --   ALBUMIN 4.1  --   AST 21  --   ALT 15  --   ALKPHOS 64  --   BILITOT 0.7  --   GFRNONAA 48* 51*  GFRAA 55* 59*  ANIONGAP 12 11     Hematology Recent Labs  Lab 11/26/18 1036 11/27/18 0425  WBC 7.3 6.3  RBC 3.61* 3.43*  HGB 8.2* 7.8*  HCT 27.9* 26.4*  MCV 77.3* 77.0*  MCH 22.7* 22.7*  MCHC 29.4* 29.5*  RDW 23.0* 23.0*  PLT 193 176    Cardiac Enzymes Recent Labs  Lab 11/26/18 1036 11/26/18 1258  TROPONINIHS 10 9    BNP Recent Labs  Lab 11/26/18 1036  BNP 428.0*     Radiology    Dg Chest Port 1 View  Result Date: 11/26/2018 CLINICAL DATA:  Shortness of breath and weakness. EXAM: PORTABLE CHEST 1 VIEW COMPARISON:  Ribs and chest x-ray 12/02/2017 FINDINGS: Heart size is normal. Mild pulmonary vascular congestion is present. There is mild atelectasis  at the bases. Small effusions are present. Minimal atelectasis is present at both bases. No other significant consolidation is present. IMPRESSION: 1. Cardiomegaly with mild pulmonary vascular congestion and small effusions consistent with congestive heart failure. Electronically Signed   By: San Morelle M.D.   On: 11/26/2018 11:20    Cardiac Studies   TEE 09/18/2017: Left ventricle: LVEF is normal.  ------------------------------------------------------------------- Aortic valve: AV is mildly thickened, calcified No definite vegetation though cannot exclude colonization. No AI.  ------------------------------------------------------------------- Aorta: Mld fixed plaquing of the thoracic aorta.  ------------------------------------------------------------------- Mitral valve: MV is normal Trace MR  ------------------------------------------------------------------- Left atrium: MIld spontaneous  swirling contrast in LA No evidence of thrombus in the atrial cavity or appendage.  ------------------------------------------------------------------- Atrial septum: No PFO by color doppler or as tested with injection of agitated saline.  ------------------------------------------------------------------- Pulmonic valve: PV is normall  ------------------------------------------------------------------- Tricuspid valve: TV is normal. Mild TR.  ------------------------------------------------------------------- Post procedure conclusions Ascending Aorta:  - Mld fixed plaquing of the thoracic aorta.  Patient Profile     85 y.o. male with a history of CAD (status post DES to the first diagonal and DES x2 to the LAD in February 2019), paroxysmal atrial fibrillation on Toprol-XL and Eliquis, chronic diastolic heart failure, PAD, mixed hyperlipidemia, type 2 diabetes mellitus, and anemia.  Assessment & Plan    1.  Slow atrial fibrillation, heart rate has improved into the 50s with hold of Toprol-XL 100 mg daily (last dose early yesterday morning).  Eliquis is currently on hold in light of GI bleed and anticipated endoscopy/colonoscopy.  2.  Acute on chronic diastolic heart failure.  Patient now on IV Lasix and has diuresed about 1100 cc in the last 24 hours.  Still feels short of breath.  Presenting chest x-ray showed vascular congestion with small pleural effusions.  3.  CKD stage III, creatinine is come down to 1.29.  4.  CAD status post DES to the first diagonal and DES x2 to the LAD in February 2019.  Plavix is also on hold at this time.  5.  Heme positive stools with anemia.  Currently being followed by Dr. Laural Golden with plan for endoscopy and colonoscopy ultimately.  He is receiving a unit of PRBCs this morning, hemoglobin 7.8.  Continue IV Lasix with increase to 60 mg BID and replete potassium, follow-up urine output and BMET in a.m.  Would keep him off Toprol-XL and  follow heart rate on telemetry.  May ultimately need to add back at lower dose depending on trend.  He remains off Eliquis and Plavix in anticipation of endoscopy and colonoscopy in the setting of GI bleed.  Signed, Rozann Lesches, MD  11/27/2018, 9:16 AM

## 2018-11-27 NOTE — Progress Notes (Signed)
Patient transferred from ICU to AP 300. Report received from [facility]. Patient is in stable condition at this time and in no acute distress. Patient is currently on cardiac monitoring and tele monitor has been placed on patient. Patient is a high fall risk and all fall risk precautions are in place however patient has refused bed alarms at this time. Patient has been educated on the use of our fall prevention strategies including bed alarms. Patient noted to have a bruise on his right knee and large bruise to left upper arm. Patient is alert and oriented X 4 and vital signs are as follows:    11/27/18 1529  Vitals  Temp 98.6 F (37 C)  Temp Source Oral  BP 116/69  BP Location Left Arm  BP Method Automatic  Patient Position (if appropriate) Sitting  Pulse Rate (!) 56  Pulse Rate Source Dinamap  Cardiac Rhythm Atrial fibrillation  Resp 20  Oxygen Therapy  SpO2 96 %  O2 Device Nasal Cannula  O2 Flow Rate (L/min) 2 L/min  Patient Activity (if Appropriate) In bed  MEWS Score  MEWS RR 0  MEWS Pulse 0  MEWS Systolic 0  MEWS LOC 0  MEWS Temp 0  MEWS Score 0  MEWS Score Color Nyoka Cowden

## 2018-11-27 NOTE — Progress Notes (Signed)
PROGRESS NOTE    Mike Wright  [id number] DOB: [birth year] DOA: 11/26/2018 PCP: Monico Blitz, MD   Brief Narrative:  Per HPI: Mike Wright is a 85 y.o. male with medical history significant for CAD with prior stents, atrial fibrillation on Eliquis, GERD, type 2 diabetes, obstructive sleep apnea, COPD with chronic hypoxemia, chronic diastolic heart failure, and peripheral vascular disease who was recently hospitalized at [facility] rocking him on 8/10 and transfused 2 units of PRBCs.  He has had problems with chronic anemia with prior endoscopies that have been unrevealing.  He was attending a follow-up with his gastroenterologist Dr. Laural Golden today when he was noted to be occult positive.  He is also been experiencing some generalized fatigue and weakness that have been worse in the past few weeks and has been associated with some mild dyspnea.  He apparently usually feels this way when he has worsening levels of anemia.  His weight has otherwise been stable and he has chronic lower extremity edema noted.  He denies any chest pain, fevers, chills, cough, palpitations, lightheadedness, dizziness, nausea, or vomiting.  No dark or bloody stools noted.  Patient was admitted with acute symptomatic blood loss anemia related to GI bleed along with slow atrial fibrillation and acute on chronic diastolic heart failure.  Assessment & Plan:   Active Problems:   Acute blood loss anemia  Acute symptomatic blood loss anemia likely related to GI bleed- downtrending -Hold home Eliquis and maintain on SCDs -Start full liquid diet this morning -Endoscopy per GI likely on 9/2 -Maintain on PPI IV twice daily -Transfuse 1 unit PRBCs today with ongoing Lasix -Follow repeat CBC in a.m.  Slow atrial fibrillation-stable -Appreciate cardiology evaluation and holding of Toprol-XL for now -Maintain on telemetry in stepdown unit to monitor carefully -Hold home metoprolol for now and allow heart rate to trend  up -Okay for transfer to telemetry today  Acute on chronic diastolic heart failure -Continue IV Lasix twice daily to 60 mg as ordered by cardiology -Monitor closely and replete potassium -Recheck a.m. labs -Monitor I's and O's and daily weights  Type 2 diabetes with hypoglycemia noted overnight -Required initiation of D5 infusion -We will hold further infusion for now as full liquid diet started -Hold further long-acting insulin -Continue SSI and monitor Accu-Cheks closely -Hold home metformin  CAD with prior stents -Hold Plavix and Eliquis for now given GI bleed -Continue statin -Hold Imdur  Hypertension -stable -Hold home lisinopril and amlodipine for now -Hold Imdur -Hold metoprolol due to bradycardia -Continue aggressive IV Lasix diuresis  GERD -We will keep on IV PPI twice daily for now  OSA -Wears nasal cannula oxygen at night and will continue  PVD with prior toe amputations -Hold Plavix for now as well as Eliquis -Continue statin  COPD with chronic hypoxemia -Continue nasal cannula oxygen -No active bronchospasms currently noted  DVT prophylaxis: SCDs Code Status: DNR Family Communication: Discussed with wife at bedside 8/31 Disposition Plan: Plan for 1 unit PRBC transfusion today with ongoing IV Lasix diuresis to 60 mg twice daily.  Monitor on telemetry.   Consultants:   Cardiology  GI-Dr. Laural Golden  Procedures:   None  Antimicrobials:   None   Subjective: Patient seen and evaluated today with no new acute complaints or concerns. No acute concerns or events noted overnight.  He appears to have diuresed well overnight and has less edema this morning.  His hemoglobin levels have lowered this morning.  No overt bleeding identified.  Objective: Vitals:  11/27/18 0845 11/27/18 0859 11/27/18 0900 11/27/18 1000  BP: (!) 123/99  127/78 137/76  Pulse: (!) 57 (!) 55 (!) 56 61  Resp:      Temp:   97.9 F (36.6 C)   TempSrc:   Oral   SpO2:  97% 95% 92%   Weight:      Height:        Intake/Output Summary (Last 24 hours) at 11/27/2018 1050 Last data filed at 11/27/2018 1000 Gross per 24 hour  Intake 710.52 ml  Output 1900 ml  Net -1189.48 ml   Filed Weights   11/26/18 1000 11/26/18 1430 11/27/18 0430  Weight: 89.8 kg 89.7 kg 90.2 kg    Examination:  General exam: Appears calm and comfortable  Respiratory system: Clear to auscultation. Respiratory effort normal.  Currently on nasal cannula oxygen Cardiovascular system: S1 & S2 heard, irregular and bradycardic. No JVD, murmurs, rubs, gallops or clicks.  Decreasing pedal edema currently with SCDs Gastrointestinal system: Abdomen is nondistended, soft and nontender. No organomegaly or masses felt. Normal bowel sounds heard. Central nervous system: Alert and oriented. No focal neurological deficits. Extremities: Symmetric 5 x 5 power. Skin: No rashes, lesions or ulcers Psychiatry: Judgement and insight appear normal. Mood & affect appropriate.     Data Reviewed: I have personally reviewed following labs and imaging studies  CBC: Recent Labs  Lab 11/26/18 1036 11/27/18 0425  WBC 7.3 6.3  NEUTROABS 4.6  --   HGB 8.2* 7.8*  HCT 27.9* 26.4*  MCV 77.3* 77.0*  PLT 193 0000000   Basic Metabolic Panel: Recent Labs  Lab 11/26/18 1036 11/27/18 0425  NA 139 139  K 3.9 3.2*  CL 101 100  CO2 26 28  GLUCOSE 82 46*  BUN 28* 31*  CREATININE 1.36* 1.29*  CALCIUM 9.1 8.8*  MG  --  2.1   GFR: Estimated Creatinine Clearance: 46.5 mL/min (A) (by C-G formula based on SCr of 1.29 mg/dL (H)). Liver Function Tests: Recent Labs  Lab 11/26/18 1036  AST 21  ALT 15  ALKPHOS 64  BILITOT 0.7  PROT 7.1  ALBUMIN 4.1   Recent Labs  Lab 11/26/18 1036  LIPASE 29   No results for input(s): AMMONIA in the last 168 hours. Coagulation Profile: No results for input(s): INR, PROTIME in the last 168 hours. Cardiac Enzymes: No results for input(s): CKTOTAL, CKMB, CKMBINDEX,  TROPONINI in the last 168 hours. BNP (last 3 results) No results for input(s): PROBNP in the last 8760 hours. HbA1C: Recent Labs    11/26/18 1036  HGBA1C 6.1*   CBG: Recent Labs  Lab 11/26/18 2035 11/27/18 0008 11/27/18 0115 11/27/18 0424 11/27/18 0532  GLUCAP 95 45* 97 48* 107*   Lipid Profile: No results for input(s): CHOL, HDL, LDLCALC, TRIG, CHOLHDL, LDLDIRECT in the last 72 hours. Thyroid Function Tests: No results for input(s): TSH, T4TOTAL, FREET4, T3FREE, THYROIDAB in the last 72 hours. Anemia Panel: Recent Labs    11/27/18 0840  FERRITIN 11*  TIBC 407  IRON 24*   Sepsis Labs: No results for input(s): PROCALCITON, LATICACIDVEN in the last 168 hours.  Recent Results (from the past 240 hour(s))  SARS Coronavirus 2 [facility] order, Performed in [facility] hospital lab) Nasopharyngeal Nasopharyngeal Swab     Status: None   Collection Time: 11/26/18 10:38 AM   Specimen: Nasopharyngeal Swab  Result Value Ref Range Status   SARS Coronavirus 2 NEGATIVE NEGATIVE Final    Comment: (NOTE) If result is NEGATIVE SARS-CoV-2 target nucleic  acids are NOT DETECTED. The SARS-CoV-2 RNA is generally detectable in upper and lower  respiratory specimens during the acute phase of infection. The lowest  concentration of SARS-CoV-2 viral copies this assay can detect is 250  copies / mL. A negative result does not preclude SARS-CoV-2 infection  and should not be used as the sole basis for treatment or other  patient management decisions.  A negative result may occur with  improper specimen collection / handling, submission of specimen other  than nasopharyngeal swab, presence of viral mutation(s) within the  areas targeted by this assay, and inadequate number of viral copies  (<250 copies / mL). A negative result must be combined with clinical  observations, patient history, and epidemiological information. If result is POSITIVE SARS-CoV-2 target nucleic acids are DETECTED. The  SARS-CoV-2 RNA is generally detectable in upper and lower  respiratory specimens dur ing the acute phase of infection.  Positive  results are indicative of active infection with SARS-CoV-2.  Clinical  correlation with patient history and other diagnostic information is  necessary to determine patient infection status.  Positive results do  not rule out bacterial infection or co-infection with other viruses. If result is PRESUMPTIVE POSTIVE SARS-CoV-2 nucleic acids MAY BE PRESENT.   A presumptive positive result was obtained on the submitted specimen  and confirmed on repeat testing.  While 2019 novel coronavirus  (SARS-CoV-2) nucleic acids may be present in the submitted sample  additional confirmatory testing may be necessary for epidemiological  and / or clinical management purposes  to differentiate between  SARS-CoV-2 and other Sarbecovirus currently known to infect humans.  If clinically indicated additional testing with an alternate test  methodology [id number]) is advised. The SARS-CoV-2 RNA is generally  detectable in upper and lower respiratory sp ecimens during the acute  phase of infection. The expected result is Negative. Fact Sheet for Patients:  StrictlyIdeas.no Fact Sheet for Healthcare Providers: BankingDealers.co.za This test is not yet approved or cleared by the [place] FDA and has been authorized for detection and/or diagnosis of SARS-CoV-2 by FDA under an Emergency Use Authorization (EUA).  This EUA will remain in effect (meaning this test can be used) for the duration of the COVID-19 declaration under Section 564(b)(1) of the Act, 21 U.S.C. section 360bbb-3(b)(1), unless the authorization is terminated or revoked sooner. Performed at [facility], [address], [place], [place] [address]   MRSA PCR Screening     Status: Abnormal   Collection Time: 11/26/18  2:31 PM   Specimen: Nasal Mucosa; Nasopharyngeal    Result Value Ref Range Status   MRSA by PCR POSITIVE (A) NEGATIVE Final    Comment:        The GeneXpert MRSA Assay (FDA approved for NASAL specimens only), is one component of a comprehensive MRSA colonization surveillance program. It is not intended to diagnose MRSA infection nor to guide or monitor treatment for MRSA infections. RESULT CALLED TO, READ BACK BY AND VERIFIED WITH: TETREAULT,[email or web address]  BY MATTHEWS, B 9.1.2020 Performed at [facility], [address], [place], [place] [address]          Radiology Studies: Dg Chest [facility] 1 View  Result Date: 11/26/2018 CLINICAL DATA:  Shortness of breath and weakness. EXAM: PORTABLE CHEST 1 VIEW COMPARISON:  Ribs and chest x-ray 12/02/2017 FINDINGS: Heart size is normal. Mild pulmonary vascular congestion is present. There is mild atelectasis at the bases. Small effusions are present. Minimal atelectasis is present at both bases. No other significant consolidation is present.  IMPRESSION: 1. Cardiomegaly with mild pulmonary vascular congestion and small effusions consistent with congestive heart failure. Electronically Signed   By: San Morelle M.D.   On: 11/26/2018 11:20        Scheduled Meds:  atorvastatin  80 mg Oral QPM   Chlorhexidine Gluconate Cloth  6 each Topical Daily   furosemide  60 mg Intravenous Q12H   insulin aspart  0-9 Units Subcutaneous Q6H   [START ON 11/28/2018] mouth rinse  15 mL Mouth Rinse BID   mupirocin ointment  1 application Nasal BID   pantoprazole (PROTONIX) IV  40 mg Intravenous Q12H   potassium chloride  40 mEq Oral Daily   sodium chloride flush  3 mL Intravenous Q12H   traZODone  100 mg Oral QHS   Continuous Infusions:  sodium chloride       LOS: 1 day    Time spent: 30 minutes    Hallie Ertl Darleen Crocker, DO [facility] Pager [phone]  If 7PM-7AM, please contact night-coverage [email or web address] Password [email or web address] 11/27/2018, 10:50 AM

## 2018-11-28 ENCOUNTER — Encounter (HOSPITAL_COMMUNITY)
Admission: EM | Disposition: A | Discharge status: Home Health | Payer: Self-pay | Source: Home / Self Care | Attending: Internal Medicine

## 2018-11-28 ENCOUNTER — Inpatient Hospital Stay (HOSPITAL_COMMUNITY): Payer: Medicare HMO | Admitting: Anesthesiology

## 2018-11-28 ENCOUNTER — Encounter (HOSPITAL_COMMUNITY): Payer: Self-pay | Admitting: *Deleted

## 2018-11-28 DIAGNOSIS — K317 Polyp of stomach and duodenum: Secondary | ICD-10-CM

## 2018-11-28 DIAGNOSIS — E11649 Type 2 diabetes mellitus with hypoglycemia without coma: Secondary | ICD-10-CM | POA: Diagnosis not present

## 2018-11-28 DIAGNOSIS — K3189 Other diseases of stomach and duodenum: Secondary | ICD-10-CM

## 2018-11-28 DIAGNOSIS — I25118 Atherosclerotic heart disease of native coronary artery with other forms of angina pectoris: Secondary | ICD-10-CM

## 2018-11-28 DIAGNOSIS — K766 Portal hypertension: Secondary | ICD-10-CM

## 2018-11-28 DIAGNOSIS — I4891 Unspecified atrial fibrillation: Secondary | ICD-10-CM

## 2018-11-28 DIAGNOSIS — I5033 Acute on chronic diastolic (congestive) heart failure: Secondary | ICD-10-CM

## 2018-11-28 DIAGNOSIS — D62 Acute posthemorrhagic anemia: Secondary | ICD-10-CM

## 2018-11-28 DIAGNOSIS — J449 Chronic obstructive pulmonary disease, unspecified: Secondary | ICD-10-CM | POA: Diagnosis not present

## 2018-11-28 DIAGNOSIS — K228 Other specified diseases of esophagus: Secondary | ICD-10-CM

## 2018-11-28 DIAGNOSIS — N183 Chronic kidney disease, stage 3 (moderate): Secondary | ICD-10-CM

## 2018-11-28 DIAGNOSIS — I739 Peripheral vascular disease, unspecified: Secondary | ICD-10-CM | POA: Diagnosis not present

## 2018-11-28 DIAGNOSIS — K5730 Diverticulosis of large intestine without perforation or abscess without bleeding: Secondary | ICD-10-CM

## 2018-11-28 DIAGNOSIS — Z89429 Acquired absence of other toe(s), unspecified side: Secondary | ICD-10-CM | POA: Diagnosis not present

## 2018-11-28 DIAGNOSIS — D500 Iron deficiency anemia secondary to blood loss (chronic): Secondary | ICD-10-CM | POA: Diagnosis not present

## 2018-11-28 HISTORY — PX: POLYPECTOMY: SHX5525

## 2018-11-28 HISTORY — PX: COLONOSCOPY WITH PROPOFOL: SHX5780

## 2018-11-28 HISTORY — PX: ESOPHAGOGASTRODUODENOSCOPY (EGD) WITH PROPOFOL: SHX5813

## 2018-11-28 LAB — CBC
HCT: 32.6 % — ABNORMAL LOW (ref 39.0–52.0)
Hemoglobin: 9.8 g/dL — ABNORMAL LOW (ref 13.0–17.0)
MCH: 23.3 pg — ABNORMAL LOW (ref 26.0–34.0)
MCHC: 30.1 g/dL (ref 30.0–36.0)
MCV: 77.4 fL — ABNORMAL LOW (ref 80.0–100.0)
Platelets: 161 10*3/uL (ref 150–400)
RBC: 4.21 MIL/uL — ABNORMAL LOW (ref 4.22–5.81)
RDW: 23 % — ABNORMAL HIGH (ref 11.5–15.5)
WBC: 5.4 10*3/uL (ref 4.0–10.5)
nRBC: 0 % (ref 0.0–0.2)

## 2018-11-28 LAB — GLUCOSE, CAPILLARY
Glucose-Capillary: 114 mg/dL — ABNORMAL HIGH (ref 70–99)
Glucose-Capillary: 121 mg/dL — ABNORMAL HIGH (ref 70–99)
Glucose-Capillary: 140 mg/dL — ABNORMAL HIGH (ref 70–99)
Glucose-Capillary: 148 mg/dL — ABNORMAL HIGH (ref 70–99)
Glucose-Capillary: 150 mg/dL — ABNORMAL HIGH (ref 70–99)

## 2018-11-28 LAB — BASIC METABOLIC PANEL
Anion gap: 6 (ref 5–15)
Anion gap: 8 (ref 5–15)
BUN: 21 mg/dL (ref 8–23)
BUN: 17 mg/dL (ref 8–23)
CO2: 28 mmol/L (ref 22–32)
CO2: 27 mmol/L (ref 22–32)
Calcium: 9 mg/dL (ref 8.9–10.3)
Calcium: 8.7 mg/dL — ABNORMAL LOW (ref 8.9–10.3)
Chloride: 106 mmol/L (ref 98–111)
Chloride: 102 mmol/L (ref 98–111)
Creatinine, Ser: 1.13 mg/dL (ref 0.61–1.24)
Creatinine, Ser: 1.05 mg/dL (ref 0.61–1.24)
GFR calc Af Amer: >60 mL/min (ref >60)
GFR calc Af Amer: >60 mL/min (ref >60)
GFR calc non Af Amer: 60 mL/min — ABNORMAL LOW (ref >60)
GFR calc non Af Amer: >60 mL/min (ref >60)
Glucose, Bld: 113 mg/dL — ABNORMAL HIGH (ref 70–99)
Glucose, Bld: 188 mg/dL — ABNORMAL HIGH (ref 70–99)
Potassium: 3.4 mmol/L — ABNORMAL LOW (ref 3.5–5.1)
Potassium: 3.6 mmol/L (ref 3.5–5.1)
Sodium: 140 mmol/L (ref 135–145)
Sodium: 137 mmol/L (ref 135–145)

## 2018-11-28 LAB — BPAM RBC
Blood Product Expiration Date: 202010012359
ISSUE DATE / TIME: 202009010838
Unit Type and Rh: 5100

## 2018-11-28 LAB — MAGNESIUM: Magnesium: 2.1 mg/dL (ref 1.7–2.4)

## 2018-11-28 LAB — TYPE AND SCREEN
ABO/RH(D): O POS
Antibody Screen: NEGATIVE
Unit division: 0

## 2018-11-28 SURGERY — ESOPHAGOGASTRODUODENOSCOPY (EGD) WITH PROPOFOL
Anesthesia: General

## 2018-11-28 MED ORDER — POTASSIUM CHLORIDE 10 MEQ/100ML IV SOLN
10.0000 meq | INTRAVENOUS | Status: AC
  Administered 2018-11-28 (×2): 10 meq via INTRAVENOUS
  Filled 2018-11-28 (×2): qty 100

## 2018-11-28 MED ORDER — EPHEDRINE SULFATE 50 MG/ML IJ SOLN
INTRAMUSCULAR | Status: DC | PRN
  Administered 2018-11-28: 10 mg via INTRAVENOUS

## 2018-11-28 MED ORDER — PEG 3350-KCL-NA BICARB-NACL 420 G PO SOLR
4000.0000 mL | Freq: Once | ORAL | Status: AC
  Administered 2018-11-28: 09:00:00 4000 mL via ORAL
  Filled 2018-11-28: qty 4000

## 2018-11-28 MED ORDER — LACTATED RINGERS IV SOLN
INTRAVENOUS | Status: DC
  Administered 2018-11-28: 1000 mL via INTRAVENOUS

## 2018-11-28 MED ORDER — FUROSEMIDE 10 MG/ML IJ SOLN
40.0000 mg | Freq: Two times a day (BID) | INTRAMUSCULAR | Status: DC
  Administered 2018-11-28 – 2018-11-29 (×3): 40 mg via INTRAVENOUS
  Filled 2018-11-28 (×3): qty 4

## 2018-11-28 MED ORDER — GLUCAGON HCL RDNA (DIAGNOSTIC) 1 MG IJ SOLR
INTRAMUSCULAR | Status: DC | PRN
  Administered 2018-11-28 (×3): 0.25 mg via INTRAVENOUS

## 2018-11-28 MED ORDER — PROPOFOL 10 MG/ML IV BOLUS
INTRAVENOUS | Status: AC
  Filled 2018-11-28: qty 20

## 2018-11-28 MED ORDER — FUROSEMIDE 10 MG/ML IJ SOLN: 40.0000 mg | Freq: Two times a day (BID) | INTRAMUSCULAR | Status: DC

## 2018-11-28 MED ORDER — PROMETHAZINE HCL 25 MG/ML IJ SOLN: 6.2500 mg | INTRAMUSCULAR | Status: DC | PRN

## 2018-11-28 MED ORDER — GLUCAGON HCL RDNA (DIAGNOSTIC) 1 MG IJ SOLR
INTRAMUSCULAR | Status: AC
  Filled 2018-11-28: qty 1

## 2018-11-28 MED ORDER — PROPOFOL 500 MG/50ML IV EMUL
INTRAVENOUS | Status: DC | PRN
  Administered 2018-11-28: 150 ug/kg/min via INTRAVENOUS
  Administered 2018-11-28 (×2): via INTRAVENOUS

## 2018-11-28 MED ORDER — MIDAZOLAM HCL 2 MG/2ML IJ SOLN: 0.5000 mg | Freq: Once | INTRAMUSCULAR | Status: DC | PRN

## 2018-11-28 MED ORDER — SODIUM CHLORIDE 0.9 % IV SOLN: INTRAVENOUS | Status: DC

## 2018-11-28 MED ORDER — KETAMINE HCL 50 MG/5ML IJ SOSY
PREFILLED_SYRINGE | INTRAMUSCULAR | Status: AC
  Filled 2018-11-28: qty 5

## 2018-11-28 MED ORDER — PROPOFOL 10 MG/ML IV BOLUS
INTRAVENOUS | Status: DC | PRN
  Administered 2018-11-28: 20 mg via INTRAVENOUS

## 2018-11-28 NOTE — Progress Notes (Signed)
Brief EGD and colonoscopy notes  Normal esophageal exam Mild portal gastropathy. 2 flattened duodenal polyps.  One was small and the other one was about 10 x 6 mm. All polyp was cold snared. Larger polyp raised with eleview; focal to move in 1 piece.  Moved piecemeal.  Residual polyp was ablated via cold biopsy and APC.   Multiple diverticula in sigmoid and descending colon. No other abnormalities noted. Prep was marginal.

## 2018-11-28 NOTE — Transfer of Care (Signed)
Immediate Anesthesia Transfer of Care Note  Patient: KENTAVIOUS GRABINSKI  Procedure(s) Performed: ESOPHAGOGASTRODUODENOSCOPY (EGD) WITH PROPOFOL (N/A ) COLONOSCOPY WITH PROPOFOL (N/A ) POLYPECTOMY  Patient Location: PACU  Anesthesia Type:General  Level of Consciousness: awake  Airway & Oxygen Therapy: Patient Spontanous Breathing  Post-op Assessment: Report given to RN  Post vital signs: Reviewed and stable  Last Vitals:  Vitals Value Taken Time  BP 127/63 11/28/18 1615  Temp 36.4 C 11/28/18 1604  Pulse 66 11/28/18 1616  Resp 20 11/28/18 1616  SpO2 93 % 11/28/18 1616  Vitals shown include unvalidated device data.  Last Pain:  Vitals:   11/28/18 1604  TempSrc:   PainSc: 0-No pain      Patients Stated Pain Goal: 8 ([id number])  Complications: No apparent anesthesia complications

## 2018-11-28 NOTE — Anesthesia Procedure Notes (Signed)
Procedure Name: General with mask airway Performed by: Araceli Coufal A, CRNA Pre-anesthesia Checklist: Timeout performed, Patient being monitored, Suction available, Emergency Drugs available and Patient identified       

## 2018-11-28 NOTE — Care Management Important Message (Signed)
Important Message  Patient Details  Name: Mike Wright MRN: [id number] Date of Birth: 17-Nov-1933   Medicare Important Message Given:  Yes     Tommy Medal 11/28/2018, 1:47 PM

## 2018-11-28 NOTE — Progress Notes (Signed)
CC:  Abdominal pain, diarrhea, anemia, heme + stool   Subjective: Patient reports he had a good night. A little short of breath. No chest pain. No BM yet this morning. NPO for EGD and colonoscopy later today.  Objective:  Vital signs in last 24 hours: Temp:  [97.9 F (36.6 C)-98.6 F (37 C)] 98.6 F (37 C) (09/02 0600) Pulse Rate:  [46-63] 50 (09/02 0600) Resp:  [16-20] 20 (09/02 0600) BP: (116-137)/(56-99) 135/56 (09/02 0600) SpO2:  [91 %-97 %] 92 % (09/02 0600) Weight:  [87.5 kg] 87.5 kg (09/02 0500) Last BM Date: 11/27/18 General: awake, alert in NAD  Eyes: sclera nonicteric Mouth: upper dentures, missing dentition lower mouth  Heart: irregular rhythm, 2/6 systolic murmur Pulm: clear throughout, no crackles or rhonchi  Abdomen: mild generalized tenderness throughout without rebound or guarding, + BS x 4 quads. No HSM. RLQ intact.  Extremities:  Without edema. Neurologic:  Alert and  oriented x4;  grossly normal neurologically. Psych:  Alert and cooperative. Normal mood and affect.  Intake/Output from previous day: 09/01 0701 - 09/02 0700 In: 1244.6 [P.O.:660; I.V.:269.6; Blood:315] Out: 3750 [Urine:3750] Intake/Output this shift: No intake/output data recorded.  Lab Results: Recent Labs    11/26/18 1036 11/27/18 0425 11/27/18 1402 11/28/18 0545  WBC 7.3 6.3  --  5.4  HGB 8.2* 7.8* 9.3* 9.8*  HCT 27.9* 26.4* 30.9* 32.6*  PLT 193 176  --  161   BMET Recent Labs    11/26/18 1036 11/27/18 0425 11/28/18 0545  NA 139 139 140  K 3.9 3.2* 3.4*  CL 101 100 106  CO2 26 28 28   GLUCOSE 82 46* 113*  BUN 28* 31* 21  CREATININE 1.36* 1.29* 1.13  CALCIUM 9.1 8.8* 9.0   LFT Recent Labs    11/26/18 1036  PROT 7.1  ALBUMIN 4.1  AST 21  ALT 15  ALKPHOS 64  BILITOT 0.7   PT/INR No results for input(s): LABPROT, INR in the last 72 hours. Hepatitis Panel No results for input(s): HEPBSAG, HCVAB, HEPAIGM, HEPBIGM in the last 72 hours.  Dg Chest Port 1 View   Result Date: 11/26/2018 CLINICAL DATA:  Shortness of breath and weakness. EXAM: PORTABLE CHEST 1 VIEW COMPARISON:  Ribs and chest x-ray 12/02/2017 FINDINGS: Heart size is normal. Mild pulmonary vascular congestion is present. There is mild atelectasis at the bases. Small effusions are present. Minimal atelectasis is present at both bases. No other significant consolidation is present. IMPRESSION: 1. Cardiomegaly with mild pulmonary vascular congestion and small effusions consistent with congestive heart failure. Electronically Signed   By: San Morelle M.D.   On: 11/26/2018 11:20    Assessment / Plan:  1. Generalized abdominal pain, anemia with heme + stool.  Admission Hg 7.8. Received 1 unit PRBCs. Today Hg 9.8.  -EGD and colonoscopy with Propofol today, benefits and risks discussed with patient including risk with sedation, risk of bleeding, perforation and infection -NPO except ok to take bowel prep  -[place] bowel prep -IVF per hospitalist in setting or improving CHF Cardiology PA-C  [place] with Dr. [name] provided verbal cardiac clearance for EGD and colonoscopy today   2. CAD, DES  x 3, afib Plavix and Eliquis on hold  3. Atrial fibrillation with Bradycardia, had a few 2 second pauses over night. K+ 3.4.  4. CHF-D, clinically stable, chest xray not done this am  5. CKD stage III. Cr. 1.13  Further recommendations per Dr. Laural Golden after EGD and Colonoscopy completed  LOS: 2 days   Noralyn Pick  11/28/2018, 7:43 AM

## 2018-11-28 NOTE — Progress Notes (Signed)
Progress Note  Patient Name: Mike Wright Date of Encounter: 11/28/2018  Primary Cardiologist: Carlyle Dolly, MD   Subjective   Breathing has improved.  He feels full and nauseous.  He denies abdominal pain.  Inpatient Medications    Scheduled Meds: . atorvastatin  80 mg Oral QPM  . Chlorhexidine Gluconate Cloth  6 each Topical Daily  . furosemide  40 mg Intravenous Q12H  . insulin aspart  0-9 Units Subcutaneous Q6H  . mouth rinse  15 mL Mouth Rinse BID  . mupirocin ointment  1 application Nasal BID  . pantoprazole (PROTONIX) IV  40 mg Intravenous Q12H  . potassium chloride  40 mEq Oral Daily  . sodium chloride flush  3 mL Intravenous Q12H  . traZODone  100 mg Oral QHS   Continuous Infusions: . sodium chloride    . potassium chloride 10 mEq (11/28/18 0921)   PRN Meds: sodium chloride, acetaminophen **OR** acetaminophen, ondansetron **OR** ondansetron (ZOFRAN) IV, sodium chloride flush   Vital Signs    Vitals:   11/27/18 2120 11/28/18 0500 11/28/18 0600 11/28/18 0817  BP: 128/90  (!) 135/56   Pulse: (!) 53  (!) 50   Resp: 16  20   Temp: 98.6 F (37 C)  98.6 F (37 C)   TempSrc: Oral  Oral   SpO2: 93%  92% 95%  Weight:  87.5 kg    Height:        Intake/Output Summary (Last 24 hours) at 11/28/2018 1008 Last data filed at 11/28/2018 0900 Gross per 24 hour  Intake 540 ml  Output 3000 ml  Net -2460 ml   Filed Weights   11/26/18 1430 11/27/18 0430 11/28/18 0500  Weight: 89.7 kg 90.2 kg 87.5 kg    Telemetry    Atrial fibrillation- Personally Reviewed  ECG    na - Personally Reviewed  Physical Exam   GEN: No acute distress.   Neck: No JVD Cardiac:  Irregularly irregular, 2/6 systolic murmur Respiratory:  Faint bibasilar crackles. GI: Soft, nontender, non-distended  MS: No edema; No deformity. Neuro:  Nonfocal  Psych: Normal affect   Labs    Chemistry Recent Labs  Lab 11/26/18 1036 11/27/18 0425 11/28/18 0545  NA 139 139 140  K 3.9  3.2* 3.4*  CL 101 100 106  CO2 26 28 28   GLUCOSE 82 46* 113*  BUN 28* 31* 21  CREATININE 1.36* 1.29* 1.13  CALCIUM 9.1 8.8* 9.0  PROT 7.1  --   --   ALBUMIN 4.1  --   --   AST 21  --   --   ALT 15  --   --   ALKPHOS 64  --   --   BILITOT 0.7  --   --   GFRNONAA 48* 51* 60*  GFRAA 55* 59* >60  ANIONGAP 12 11 6      Hematology Recent Labs  Lab 11/26/18 1036 11/27/18 0425 11/27/18 1402 11/28/18 0545  WBC 7.3 6.3  --  5.4  RBC 3.61* 3.43*  --  4.21*  HGB 8.2* 7.8* 9.3* 9.8*  HCT 27.9* 26.4* 30.9* 32.6*  MCV 77.3* 77.0*  --  77.4*  MCH 22.7* 22.7*  --  23.3*  MCHC 29.4* 29.5*  --  30.1  RDW 23.0* 23.0*  --  23.0*  PLT 193 176  --  161    Cardiac EnzymesNo results for input(s): TROPONINI in the last 168 hours. No results for input(s): TROPIPOC in the last 168 hours.  BNP Recent Labs  Lab 11/26/18 1036  BNP 428.0*     DDimer No results for input(s): DDIMER in the last 168 hours.   Radiology    Dg Chest Port 1 View  Result Date: 11/26/2018 CLINICAL DATA:  Shortness of breath and weakness. EXAM: PORTABLE CHEST 1 VIEW COMPARISON:  Ribs and chest x-ray 12/02/2017 FINDINGS: Heart size is normal. Mild pulmonary vascular congestion is present. There is mild atelectasis at the bases. Small effusions are present. Minimal atelectasis is present at both bases. No other significant consolidation is present. IMPRESSION: 1. Cardiomegaly with mild pulmonary vascular congestion and small effusions consistent with congestive heart failure. Electronically Signed   By: San Morelle M.D.   On: 11/26/2018 11:20    Cardiac Studies   TEE 09/18/2017: Left ventricle: LVEF is normal.  ------------------------------------------------------------------- Aortic valve: AV is mildly thickened, calcified No definite vegetation though cannot exclude colonization. No AI.  ------------------------------------------------------------------- Aorta: Mld fixed plaquing of the thoracic  aorta.  ------------------------------------------------------------------- Mitral valve: MV is normal Trace MR  ------------------------------------------------------------------- Left atrium: MIld spontaneous swirling contrast in LA No evidence of thrombus in the atrial cavity or appendage.  ------------------------------------------------------------------- Atrial septum: No PFO by color doppler or as tested with injection of agitated saline.  ------------------------------------------------------------------- Pulmonic valve: PV is normall  ------------------------------------------------------------------- Tricuspid valve: TV is normal. Mild TR.  ------------------------------------------------------------------- Post procedure conclusions Ascending Aorta:  - Mld fixed plaquing of the thoracic aorta.  Patient Profile     85 y.o. male with a history of CAD(status post DES to the first diagonalandDES x2 to the LAD in February 2019), paroxysmal atrial fibrillation on Toprol-XL and Eliquis, chronic diastolic heart failure, PAD, mixed hyperlipidemia, type 2 diabetes mellitus, and anemia.  Assessment & Plan    1.  Slow atrial fibrillation: Heart rate remains in 50 bpm range with holding of Toprol-XL.  Eliquis is also on hold as he is to undergo an endoscopy and colonoscopy today.  Dose of Toprol-XL may need to be reduced prior to discharge.  2.  Acute on chronic diastolic heart failure: Currently on IV Lasix 40 mg twice daily.  He has put out over 2.5 L in the past 24 hours.  Symptoms have improved.  He still has some faint bibasilar crackles on exam.  Renal function has normalized.  3.  Chronic kidney disease stage III: Renal function is normalized today.  4.  Coronary artery disease: Status post drug-eluting stent placement to the first diagonal and drug-eluting stent placement x2 to the LAD in February 2019.  Plavix on hold.  5.  Heme positive stools with  anemia: GI plans for endoscopy and colonoscopy today.  Hemoglobin up to 9.8 today.    For questions or updates, please contact [place] Please consult [email or web address] for contact info under Cardiology/STEMI.      Signed, Kate Sable, MD  11/28/2018, 10:08 AM

## 2018-11-28 NOTE — Progress Notes (Signed)
PROGRESS NOTE    EWIN ROYSTER  [id number] DOB: 08/04/1933 DOA: 11/26/2018 PCP: Monico Blitz, MD   Brief Narrative:  Per HPI: Mike Wright is a 85 y.o. male with medical history significant for CAD with prior stents, atrial fibrillation on Eliquis, GERD, type 2 diabetes, obstructive sleep apnea, COPD with chronic hypoxemia, chronic diastolic heart failure, and peripheral vascular disease who was recently hospitalized at [facility] rocking him on 8/10 and transfused 2 units of PRBCs.  He has had problems with chronic anemia with prior endoscopies that have been unrevealing.  He was attending a follow-up with his gastroenterologist Dr. Laural Golden today when he was noted to be occult positive.  He is also been experiencing some generalized fatigue and weakness that have been worse in the past few weeks and has been associated with some mild dyspnea.  He apparently usually feels this way when he has worsening levels of anemia.  His weight has otherwise been stable and he has chronic lower extremity edema noted.  He denies any chest pain, fevers, chills, cough, palpitations, lightheadedness, dizziness, nausea, or vomiting.  No dark or bloody stools noted.  Patient was admitted with acute symptomatic blood loss anemia related to GI bleed along with slow atrial fibrillation and acute on chronic diastolic heart failure.  Assessment & Plan:  Acute blood loss anemia -Acute symptomatic blood loss anemia likely related to GI bleed -Continue holding Eliquis and Plavix. -Continue SCDs for DVT prophylaxis -Remains n.p.o. with plans for endoscopy and colonoscopy later today -Continue IV PPI. -Patient is status post 1 unit PRBC transfused during this hospitalization -Continue to follow hemoglobin trend.    Slow atrial fibrillation-stable -Appreciate cardiology evaluation and holding of Toprol-XL for now -Maintain on telemetry in stepdown unit to monitor carefully -Hold home metoprolol for now and allow  heart rate to trend up -Okay for transfer to telemetry today  Acute on chronic diastolic heart failure -Continue IV Lasix twice daily to 60 mg as ordered by cardiology -Monitor closely and replete potassium -Recheck a.m. labs -Monitor I's and O's and daily weights  Type 2 diabetes with hypoglycemia  -Required initiation of D5 infusion in the setting of hypoglycemia -Continue holding sliding scale insulin and follow CBGs trend -While inpatient will continue holding oral hypoglycemic agents.  CAD with prior stents -Continue holding Plavix and Eliquis -We will continue statins -Patient denies chest pain -Follow cardiology recommendations -Continue to hold Imdur for now.    Hypertension  -stable overall -Continue holding lisinopril and amlodipine for now. -We will also continue holding Imdur and metoprolol in the setting of bradycardia and concerns for ongoing acute bleeding. -Continue IV Lasix (dose adjusted to 40 mg every 12 hours). -Follow vital signs and cardiology recommendations.  GERD -Continue IV PPI -Follow GI recommendations.    OSA -Wears nasal cannula oxygen at night and will continue with that -Reports no PND.  PVD with prior toe amputations -Continue holding Plavix for now as well as Eliquis -Continue statin  COPD with chronic hypoxemia -No active bronchospasms currently noted -Reports overall improvement in his breathing. -Continue PRN O2 supplementation; at baseline good saturation on room air.  DVT prophylaxis: SCDs Code Status: DNR Family Communication: Discussed with wife at bedside 8/31 Disposition Plan: Status post 1 unit of PRBC transfusion on 11/27/2018; patient overall stable and reporting significant improvement in his breathing.  No overt bleeding appreciated.  Will follow results of endoscopy and colonoscopy later today.  Follow GI recommendations and cardiology advise.   Consultants:  Cardiology  GI-Dr. Laural Golden  Procedures:    None  Antimicrobials:   None   Subjective: Afebrile, no chest pain, no nausea, no vomiting.  Reports no further operative bleeding.  Significant improvement in his breathing.  Denies orthopnea.  Objective: Vitals:   11/28/18 1315 11/28/18 1426 11/28/18 1604 11/28/18 1615  BP: 139/64 (!) 154/69 94/69 127/63  Pulse: 78  83 60  Resp: 17 (!) 26 14 19   Temp: 98 F (36.7 C) 98.2 F (36.8 C) 97.6 F (36.4 C)   TempSrc:  Oral    SpO2: 98% 97% 97% 93%  Weight:      Height:        Intake/Output Summary (Last 24 hours) at 11/28/2018 1617 Last data filed at 11/28/2018 1616 Gross per 24 hour  Intake 1240 ml  Output 2950 ml  Net -1710 ml   Filed Weights   11/26/18 1430 11/27/18 0430 11/28/18 0500  Weight: 89.7 kg 90.2 kg 87.5 kg    Examination: General exam: Alert, awake, oriented x 3; denies chest pain, significant improvement in breathing.  Patient is afebrile.  No further signs of overt bleeding. Respiratory system: Decreased breath sounds at the bases, no frank crackles, no wheezing, no requiring oxygen supplementation.  Normal respiratory effort.   Cardiovascular system: Rate controlled. No rubs or gallops. Gastrointestinal system: Abdomen is nondistended, soft and nontender. No organomegaly or masses felt. Normal bowel sounds heard. Central nervous system: Alert and oriented. No focal neurological deficits. Extremities: No cyanosis or clubbing. Skin: No rashes, lesions or ulcers Psychiatry: Judgement and insight appear normal. Mood & affect appropriate.    Data Reviewed: I have personally reviewed following labs and imaging studies  CBC: Recent Labs  Lab 11/26/18 1036 11/27/18 0425 11/27/18 1402 11/28/18 0545  WBC 7.3 6.3  --  5.4  NEUTROABS 4.6  --   --   --   HGB 8.2* 7.8* 9.3* 9.8*  HCT 27.9* 26.4* 30.9* 32.6*  MCV 77.3* 77.0*  --  77.4*  PLT 193 176  --  [id number]   Basic Metabolic Panel: Recent Labs  Lab 11/26/18 1036 11/27/18 0425 11/28/18 0545  NA 139 139  140  K 3.9 3.2* 3.4*  CL 101 100 106  CO2 26 28 28   GLUCOSE 82 46* 113*  BUN 28* 31* 21  CREATININE 1.36* 1.29* 1.13  CALCIUM 9.1 8.8* 9.0  MG  --  2.1 2.1   GFR: Estimated Creatinine Clearance: 52.3 mL/min (by C-G formula based on SCr of 1.13 mg/dL).   Liver Function Tests: Recent Labs  Lab 11/26/18 1036  AST 21  ALT 15  ALKPHOS 64  BILITOT 0.7  PROT 7.1  ALBUMIN 4.1   Recent Labs  Lab 11/26/18 1036  LIPASE 29   HbA1C: Recent Labs    11/26/18 1036  HGBA1C 6.1*   CBG: Recent Labs  Lab 11/27/18 1659 11/27/18 2356 11/28/18 0605 11/28/18 1154 11/28/18 1434  GLUCAP 148* 135* 121* 140* 114*   Anemia Panel: Recent Labs    11/27/18 0840  FERRITIN 11*  TIBC 407  IRON 24*    Recent Results (from the past 240 hour(s))  SARS Coronavirus 2 [facility] order, Performed in [facility] hospital lab) Nasopharyngeal Nasopharyngeal Swab     Status: None   Collection Time: 11/26/18 10:38 AM   Specimen: Nasopharyngeal Swab  Result Value Ref Range Status   SARS Coronavirus 2 NEGATIVE NEGATIVE Final    Comment: (NOTE) If result is NEGATIVE SARS-CoV-2 target nucleic acids are NOT DETECTED.  The SARS-CoV-2 RNA is generally detectable in upper and lower  respiratory specimens during the acute phase of infection. The lowest  concentration of SARS-CoV-2 viral copies this assay can detect is 250  copies / mL. A negative result does not preclude SARS-CoV-2 infection  and should not be used as the sole basis for treatment or other  patient management decisions.  A negative result may occur with  improper specimen collection / handling, submission of specimen other  than nasopharyngeal swab, presence of viral mutation(s) within the  areas targeted by this assay, and inadequate number of viral copies  (<250 copies / mL). A negative result must be combined with clinical  observations, patient history, and epidemiological information. If result is POSITIVE SARS-CoV-2 target  nucleic acids are DETECTED. The SARS-CoV-2 RNA is generally detectable in upper and lower  respiratory specimens dur ing the acute phase of infection.  Positive  results are indicative of active infection with SARS-CoV-2.  Clinical  correlation with patient history and other diagnostic information is  necessary to determine patient infection status.  Positive results do  not rule out bacterial infection or co-infection with other viruses. If result is PRESUMPTIVE POSTIVE SARS-CoV-2 nucleic acids MAY BE PRESENT.   A presumptive positive result was obtained on the submitted specimen  and confirmed on repeat testing.  While 2019 novel coronavirus  (SARS-CoV-2) nucleic acids may be present in the submitted sample  additional confirmatory testing may be necessary for epidemiological  and / or clinical management purposes  to differentiate between  SARS-CoV-2 and other Sarbecovirus currently known to infect humans.  If clinically indicated additional testing with an alternate test  methodology [phone]) is advised. The SARS-CoV-2 RNA is generally  detectable in upper and lower respiratory sp ecimens during the acute  phase of infection. The expected result is Negative. Fact Sheet for Patients:  StrictlyIdeas.no Fact Sheet for Healthcare Providers: BankingDealers.co.za This test is not yet approved or cleared by the [place] FDA and has been authorized for detection and/or diagnosis of SARS-CoV-2 by FDA under an Emergency Use Authorization (EUA).  This EUA will remain in effect (meaning this test can be used) for the duration of the COVID-19 declaration under Section 564(b)(1) of the Act, 21 U.S.C. section 360bbb-3(b)(1), unless the authorization is terminated or revoked sooner. Performed at [facility], [address], [place], [place] [address]   MRSA PCR Screening     Status: Abnormal   Collection Time: 11/26/18  2:31 PM   Specimen:  Nasal Mucosa; Nasopharyngeal  Result Value Ref Range Status   MRSA by PCR POSITIVE (A) NEGATIVE Final    Comment:        The GeneXpert MRSA Assay (FDA approved for NASAL specimens only), is one component of a comprehensive MRSA colonization surveillance program. It is not intended to diagnose MRSA infection nor to guide or monitor treatment for MRSA infections. RESULT CALLED TO, READ BACK BY AND VERIFIED WITH: TETREAULT,[email or web address]  BY MATTHEWS, B 9.1.2020 Performed at [facility], [address] [place], [place] [address]      Radiology Studies: No results found.   Scheduled Meds: . [MAR Hold] atorvastatin  80 mg Oral QPM  . [MAR Hold] Chlorhexidine Gluconate Cloth  6 each Topical Daily  . [MAR Hold] furosemide  40 mg Intravenous Q12H  . [MAR Hold] insulin aspart  0-9 Units Subcutaneous Q6H  . [MAR Hold] mouth rinse  15 mL Mouth Rinse BID  . [MAR Hold] mupirocin ointment  1 application Nasal BID  . [  MAR Hold] pantoprazole (PROTONIX) IV  40 mg Intravenous Q12H  . [MAR Hold] potassium chloride  40 mEq Oral Daily  . [MAR Hold] sodium chloride flush  3 mL Intravenous Q12H  . [MAR Hold] traZODone  100 mg Oral QHS   Continuous Infusions: . [MAR Hold] sodium chloride    . sodium chloride    . lactated ringers 1,000 mL (11/28/18 1434)     LOS: 2 days    Time spent: 30 minutes    Barton Dubois, MD [facility] Pager [phone]  11/28/2018, 4:17 PM

## 2018-11-28 NOTE — Op Note (Addendum)
[facility] Patient Name: Mike Wright Procedure Date: 11/28/2018 1:57 PM MRN: [id number] Date of Birth: 11/16/1933 Attending MD: Hildred Laser , MD CSN: [id number] Age: 85 Admit Type: Inpatient Procedure:                Upper GI endoscopy Indications:              Iron deficiency anemia secondary to chronic blood                            loss Providers:                Hildred Laser, MD, Otis Peak B. Sharon Seller, RN, Raphael Gibney, Technician Referring MD:             Heath Lark, DO Medicines:                Propofol per Anesthesia Complications:            No immediate complications. Estimated Blood Loss:     Estimated blood loss was minimal. Procedure:                Pre-Anesthesia Assessment:                           - Prior to the procedure, a History and Physical                            was performed, and patient medications and                            allergies were reviewed. The patient's tolerance of                            previous anesthesia was also reviewed. The risks                            and benefits of the procedure and the sedation                            options and risks were discussed with the patient.                            All questions were answered, and informed consent                            was obtained. Prior Anticoagulants: The patient                            last took Eliquis (apixaban) 3 days prior to the                            procedure. ASA Grade Assessment: IV - A patient  with severe systemic disease that is a constant                            threat to life. After reviewing the risks and                            benefits, the patient was deemed in satisfactory                            condition to undergo the procedure.                           After obtaining informed consent, the endoscope was                            passed under direct vision.  Throughout the                            procedure, the patient's blood pressure, pulse, and                            oxygen saturations were monitored continuously. The                            [id number]-[id number]) scope was introduced through the                            mouth, and advanced to the second part of duodenum.                            The upper GI endoscopy was accomplished without                            difficulty. The patient tolerated the procedure                            well. Scope In: 2:54:19 PM Scope Out: 3:24:58 PM Total Procedure Duration: 0 hours 30 minutes 39 seconds  Findings:      The examined esophagus was normal.      The Z-line was irregular and was found 40 cm from the incisors.      Mild portal hypertensive gastropathy was found in the gastric fundus and       in the gastric body.      The exam of the stomach was otherwise normal.      The duodenal bulb was normal.      Two small and medium. sessile polyps with no bleeding were found in the       second portion of the duodenum. Samller polyp was removed with a cold       snare. Resection and retrieval were complete. The pathology specimen was       placed into Bottle Number 1. Larger polyp was raised with eleview.       Elevation not satisfactory. The polyp was removed with a cold snare and       cold biopsy. Residual polyp  coagulated with argon plasma coagulation.       Coagulation for destruction of remaining portion of lesion using argon       plasma was successful. Impression:               - Normal esophagus.                           - Z-line irregular, 40 cm from the incisors.                           - Portal hypertensive gastropathy.                           - Normal duodenal bulb.                           - Two duodenal polyps. Smaller polyp resected and                            retrieved. Larger polyp raised cold snared,cold                            biopsied and treated  with argon plasma coagulation                            (APC). Moderate Sedation:      Per Anesthesia Care Recommendation:           - Return patient to hospital ward for ongoing care.                           - Diabetic (ADA) diet today.                           - Continue present medications.                           - Await pathology results.                           - Resume Eliquis (apixaban) at prior dose in 4 days.                           - Await pathology results.                           - Repeat upper endoscopy after studies are complete. Procedure Code(s):        --- Professional ---                           [id number], Esophagogastroduodenoscopy, flexible,                            transoral; with removal of tumor(s), polyp(s), or                            other lesion(s) by snare technique  [id number], Esophagogastroduodenoscopy, flexible,                            transoral; with directed submucosal injection(s),                            any substance Diagnosis Code(s):        --- Professional ---                           K22.8, Other specified diseases of esophagus                           K76.6, Portal hypertension                           K31.89, Other diseases of stomach and duodenum                           K31.7, Polyp of stomach and duodenum                           D50.0, Iron deficiency anemia secondary to blood                            loss (chronic) CPT copyright 2019 American Medical Association. All rights reserved. The codes documented in this report are preliminary and upon coder review may  be revised to meet current compliance requirements. Hildred Laser, MD Hildred Laser, MD 11/28/2018 4:16:12 PM This report has been signed electronically. Number of Addenda: 0

## 2018-11-28 NOTE — Anesthesia Postprocedure Evaluation (Signed)
Anesthesia Post Note  Patient: Mike Wright  Procedure(s) Performed: ESOPHAGOGASTRODUODENOSCOPY (EGD) WITH PROPOFOL (N/A ) COLONOSCOPY WITH PROPOFOL (N/A ) POLYPECTOMY  Patient location during evaluation: PACU Anesthesia Type: General Level of consciousness: awake, awake and alert and patient cooperative Pain management: pain level controlled Vital Signs Assessment: post-procedure vital signs reviewed and stable Respiratory status: spontaneous breathing Cardiovascular status: blood pressure returned to baseline and stable Postop Assessment: no apparent nausea or vomiting Anesthetic complications: no     Last Vitals:  Vitals:   11/28/18 1604 11/28/18 1615  BP: 94/69 127/63  Pulse: 83 60  Resp: 14 19  Temp: 36.4 C   SpO2: 97% 93%    Last Pain:  Vitals:   11/28/18 1604  TempSrc:   PainSc: 0-No pain                 Tyan Lasure

## 2018-11-28 NOTE — Op Note (Signed)
[facility] Patient Name: Mike Wright Procedure Date: 11/28/2018 3:25 PM MRN: [id number] Date of Birth: June 18, 1933 Attending MD: Hildred Laser , MD CSN: [id number] Age: 85 Admit Type: Inpatient Procedure:                Colonoscopy Indications:              Iron deficiency anemia secondary to chronic blood                            loss Providers:                Hildred Laser, MD, Gwynneth Albright RN, RN,                            Nelma Rothman, Technician Referring MD:             Heath Lark, DO Medicines:                Propofol per Anesthesia Complications:            No immediate complications. Estimated Blood Loss:     Estimated blood loss: none. Procedure:                Pre-Anesthesia Assessment:                           - Prior to the procedure, a History and Physical                            was performed, and patient medications and                            allergies were reviewed. The patient's tolerance of                            previous anesthesia was also reviewed. The risks                            and benefits of the procedure and the sedation                            options and risks were discussed with the patient.                            All questions were answered, and informed consent                            was obtained. Prior Anticoagulants: The patient                            last took Eliquis (apixaban) 3 days prior to the                            procedure. ASA Grade Assessment: IV - A patient  with severe systemic disease that is a constant                            threat to life. After reviewing the risks and                            benefits, the patient was deemed in satisfactory                            condition to undergo the procedure.                           After obtaining informed consent, the colonoscope                            was passed under direct vision. Throughout the                            procedure, the patient's blood pressure, pulse, and                            oxygen saturations were monitored continuously. The                            [id number]-[id number]) scope was introduced through                            the anus and advanced to the the cecum, identified                            by appendiceal orifice and ileocecal valve. The                            colonoscopy was somewhat difficult due to a                            redundant colon. The patient tolerated the                            procedure well. The quality of the bowel                            preparation was fair. The ileocecal valve,                            appendiceal orifice, and rectum were photographed. Scope In: 3:29:21 PM Scope Out: 3:50:51 PM Scope Withdrawal Time: 0 hours 7 minutes 32 seconds  Total Procedure Duration: 0 hours 21 minutes 30 seconds  Findings:      The perianal and digital rectal examinations were normal.      Multiple small and large-mouthed diverticula were found in the sigmoid       colon and descending colon.      The exam was otherwise normal throughout the examined colon.      The retroflexed  view of the distal rectum and anal verge was normal and       showed no anal or rectal abnormalities. Impression:               - Preparation of the colon was fair.                           - Diverticulosis in the sigmoid colon and in the                            descending colon.                           - No specimens collected. Moderate Sedation:      Per Anesthesia Care Recommendation:           - Return patient to hospital ward for ongoing care.                           - Diabetic (ADA) diet today.                           - Continue present medications.                           - See the other procedure note for documentation of                            additional recommendations.                           - Small bowel Given  capsule study next week as                            outpatient. Procedure Code(s):        --- Professional ---                           [phone], Colonoscopy, flexible; diagnostic, including                            collection of specimen(s) by brushing or washing,                            when performed (separate procedure) Diagnosis Code(s):        --- Professional ---                           D50.0, Iron deficiency anemia secondary to blood                            loss (chronic)                           K57.30, Diverticulosis of large intestine without                            perforation  or abscess without bleeding CPT copyright 2019 American Medical Association. All rights reserved. The codes documented in this report are preliminary and upon coder review may  be revised to meet current compliance requirements. Hildred Laser, MD Hildred Laser, MD 11/28/2018 4:21:13 PM This report has been signed electronically. Number of Addenda: 0

## 2018-11-28 NOTE — Anesthesia Preprocedure Evaluation (Addendum)
Anesthesia Evaluation  Patient identified by MRN, date of birth, ID band Patient awake    Reviewed: Allergy & Precautions, NPO status , Patient's Chart, lab work & pertinent test results, reviewed documented beta blocker date and time   Airway Mallampati: I  TM Distance: >3 FB Neck ROM: Full    Dental no notable dental hx. (+) Edentulous Upper   Pulmonary shortness of breath, with exertion, at rest and Long-Term Oxygen Therapy, sleep apnea and Oxygen sleep apnea , COPD,  COPD inhaler and oxygen dependent, former smoker,    Pulmonary exam normal breath sounds clear to auscultation       Cardiovascular Exercise Tolerance: Poor hypertension, Pt. on medications and Pt. on home beta blockers + angina with exertion + CAD, + Peripheral Vascular Disease and +CHF  Normal cardiovascular examII Rhythm:Irregular Rate:Bradycardia     Neuro/Psych negative neurological ROS  negative psych ROS   GI/Hepatic Neg liver ROS, hiatal hernia, GERD  ,  Endo/Other  diabetes, Type 2, Oral Hypoglycemic AgentsHypothyroidism   Renal/GU Renal InsufficiencyRenal disease  negative genitourinary   Musculoskeletal negative musculoskeletal ROS (+)   Abdominal   Peds negative pediatric ROS (+)  Hematology negative hematology ROS (+)   Anesthesia Other Findings   Reproductive/Obstetrics negative OB ROS                             Anesthesia Physical Anesthesia Plan  ASA: IV  Anesthesia Plan: General   Post-op Pain Management:    Induction: Intravenous  PONV Risk Score and Plan: 2 and Propofol infusion, TIVA, Ondansetron and Treatment may vary due to age or medical condition  Airway Management Planned: Nasal Cannula and Simple Face Mask  Additional Equipment:   Intra-op Plan:   Post-operative Plan:   Informed Consent: I have reviewed the patients History and Physical, chart, labs and discussed the procedure  including the risks, benefits and alternatives for the proposed anesthesia with the patient or authorized representative who has indicated his/her understanding and acceptance.   Patient has DNR.  Suspend DNR and Discussed DNR with patient.   Dental advisory given  Plan Discussed with: CRNA  Anesthesia Plan Comments: (Plan Full PPE use  Plan GA with GETA as needed d/w pt -WTP with same after Q&A)       Anesthesia Quick Evaluation

## 2018-11-29 ENCOUNTER — Encounter (HOSPITAL_COMMUNITY): Payer: Self-pay | Admitting: Internal Medicine

## 2018-11-29 ENCOUNTER — Other Ambulatory Visit: Payer: Self-pay | Admitting: Cardiology

## 2018-11-29 DIAGNOSIS — I4821 Permanent atrial fibrillation: Secondary | ICD-10-CM

## 2018-11-29 DIAGNOSIS — J9611 Chronic respiratory failure with hypoxia: Secondary | ICD-10-CM

## 2018-11-29 DIAGNOSIS — I951 Orthostatic hypotension: Secondary | ICD-10-CM

## 2018-11-29 DIAGNOSIS — I2510 Atherosclerotic heart disease of native coronary artery without angina pectoris: Secondary | ICD-10-CM

## 2018-11-29 DIAGNOSIS — K219 Gastro-esophageal reflux disease without esophagitis: Secondary | ICD-10-CM

## 2018-11-29 LAB — CBC
HCT: 33 % — ABNORMAL LOW (ref 39.0–52.0)
Hemoglobin: 10.1 g/dL — ABNORMAL LOW (ref 13.0–17.0)
MCH: 23.8 pg — ABNORMAL LOW (ref 26.0–34.0)
MCHC: 30.6 g/dL (ref 30.0–36.0)
MCV: 77.6 fL — ABNORMAL LOW (ref 80.0–100.0)
Platelets: 163 10*3/uL (ref 150–400)
RBC: 4.25 MIL/uL (ref 4.22–5.81)
RDW: 23.4 % — ABNORMAL HIGH (ref 11.5–15.5)
WBC: 5.7 10*3/uL (ref 4.0–10.5)
nRBC: 0 % (ref 0.0–0.2)

## 2018-11-29 LAB — GLUCOSE, CAPILLARY
Glucose-Capillary: 138 mg/dL — ABNORMAL HIGH (ref 70–99)
Glucose-Capillary: 168 mg/dL — ABNORMAL HIGH (ref 70–99)

## 2018-11-29 MED ORDER — PANTOPRAZOLE SODIUM 40 MG PO TBEC: 40.0000 mg | DELAYED_RELEASE_TABLET | Freq: Two times a day (BID) | ORAL | 2 refills | Status: DC

## 2018-11-29 MED ORDER — APIXABAN 5 MG PO TABS: 5.0000 mg | ORAL_TABLET | Freq: Two times a day (BID) | ORAL | Status: DC

## 2018-11-29 MED ORDER — LISINOPRIL 5 MG PO TABS: 2.5000 mg | ORAL_TABLET | Freq: Every day | ORAL | Status: DC

## 2018-11-29 MED ORDER — POTASSIUM CHLORIDE CRYS ER 20 MEQ PO TBCR: 20.0000 meq | EXTENDED_RELEASE_TABLET | Freq: Every day | ORAL | 1 refills | Status: DC

## 2018-11-29 MED ORDER — CLOPIDOGREL BISULFATE 75 MG PO TABS: 75.0000 mg | ORAL_TABLET | Freq: Every day | ORAL | Status: DC

## 2018-11-29 MED ORDER — FUROSEMIDE 80 MG PO TABS: 80.0000 mg | ORAL_TABLET | Freq: Two times a day (BID) | ORAL | Status: DC

## 2018-11-29 NOTE — Progress Notes (Signed)
Progress Note  Patient Name: Mike Wright Date of Encounter: 11/29/2018  Primary Cardiologist: Carlyle Dolly, MD  Subjective   No chest pain or palpitations.  No abdominal pain.  Breathing comfortably at rest.  Inpatient Medications    Scheduled Meds: . atorvastatin  80 mg Oral QPM  . Chlorhexidine Gluconate Cloth  6 each Topical Daily  . furosemide  80 mg Oral BID  . insulin aspart  0-9 Units Subcutaneous Q6H  . mouth rinse  15 mL Mouth Rinse BID  . mupirocin ointment  1 application Nasal BID  . pantoprazole (PROTONIX) IV  40 mg Intravenous Q12H  . potassium chloride  40 mEq Oral Daily  . sodium chloride flush  3 mL Intravenous Q12H  . traZODone  100 mg Oral QHS   Continuous Infusions: . sodium chloride     PRN Meds: sodium chloride, acetaminophen **OR** acetaminophen, ondansetron **OR** ondansetron (ZOFRAN) IV, sodium chloride flush   Vital Signs    Vitals:   11/28/18 2000 11/28/18 2115 11/29/18 0620 11/29/18 0641  BP:  (!) 142/76 (!) 141/89   Pulse:  (!) 45 (!) 54   Resp:  20 16   Temp:  98.2 F (36.8 C) 98.3 F (36.8 C)   TempSrc:  Oral Oral   SpO2: 92% 93% 93%   Weight:    87.2 kg  Height:        Intake/Output Summary (Last 24 hours) at 11/29/2018 1039 Last data filed at 11/29/2018 0900 Gross per 24 hour  Intake 1480 ml  Output 1350 ml  Net 130 ml   Filed Weights   11/27/18 0430 11/28/18 0500 11/29/18 0641  Weight: 90.2 kg 87.5 kg 87.2 kg    Telemetry    Atrial fibrillation in the 60s.  Personally reviewed.  Physical Exam   GEN:  Elderly male, no acute distress.   Neck: No JVD. Cardiac:  Irregularly irregular, 2/6 systolic murmur.  Respiratory: Nonlabored.  Improved crackles. GI: Soft, nontender, bowel sounds present. MS: No edema; No deformity. Neuro:  Nonfocal. Psych: Alert and oriented x 3. Normal affect.  Labs    Chemistry Recent Labs  Lab 11/26/18 1036 11/27/18 0425 11/28/18 0545 11/28/18 1708  NA 139 139 140 137  K 3.9  3.2* 3.4* 3.6  CL 101 100 106 102  CO2 26 28 28 27   GLUCOSE 82 46* 113* 188*  BUN 28* 31* 21 17  CREATININE 1.36* 1.29* 1.13 1.05  CALCIUM 9.1 8.8* 9.0 8.7*  PROT 7.1  --   --   --   ALBUMIN 4.1  --   --   --   AST 21  --   --   --   ALT 15  --   --   --   ALKPHOS 64  --   --   --   BILITOT 0.7  --   --   --   GFRNONAA 48* 51* 60* >60  GFRAA 55* 59* >60 >60  ANIONGAP 12 11 6 8      Hematology Recent Labs  Lab 11/27/18 0425 11/27/18 1402 11/28/18 0545 11/29/18 0431  WBC 6.3  --  5.4 5.7  RBC 3.43*  --  4.21* 4.25  HGB 7.8* 9.3* 9.8* 10.1*  HCT 26.4* 30.9* 32.6* 33.0*  MCV 77.0*  --  77.4* 77.6*  MCH 22.7*  --  23.3* 23.8*  MCHC 29.5*  --  30.1 30.6  RDW 23.0*  --  23.0* 23.4*  PLT 176  --  161 163  Cardiac Enzymes Recent Labs  Lab 11/26/18 1036 11/26/18 1258  TROPONINIHS 10 9    BNP Recent Labs  Lab 11/26/18 1036  BNP 428.0*     Radiology    No results found.  Cardiac Studies   TEE 09/18/2017: Left ventricle: LVEF is normal.  ------------------------------------------------------------------- Aortic valve: AV is mildly thickened, calcified No definite vegetation though cannot exclude colonization. No AI.  ------------------------------------------------------------------- Aorta: Mld fixed plaquing of the thoracic aorta.  ------------------------------------------------------------------- Mitral valve: MV is normal Trace MR  ------------------------------------------------------------------- Left atrium: MIld spontaneous swirling contrast in LA No evidence of thrombus in the atrial cavity or appendage.  ------------------------------------------------------------------- Atrial septum: No PFO by color doppler or as tested with injection of agitated saline.  ------------------------------------------------------------------- Pulmonic valve: PV is normall  -------------------------------------------------------------------  Tricuspid valve: TV is normal. Mild TR.  ------------------------------------------------------------------- Post procedure conclusions Ascending Aorta:  - Mld fixed plaquing of the thoracic aorta.  Patient Profile     85 y.o. male with a history of CAD(status post DES to the first diagonalandDES x2 to the LAD in February 2019), paroxysmal atrial fibrillation on Toprol-XL and Eliquis, chronic diastolic heart failure, PAD, mixed hyperlipidemia, type 2 diabetes mellitus, and anemia.  Assessment & Plan    1.  Slow atrial fibrillation.  Heart rate is in the 50s to 60s in the absence of any AV nodal blockers at this time.  Toprol-XL 100 mg daily was discontinued on admission.  Eliquis has been on hold.  2.  GI bleed with heme positive stools, no obvious source by endoscopy or colonoscopy.  Plan is for capsule study per Dr. Laural Golden.  3.  CKD stage III, creatinine has improved to 1.05.  4.  Acute on chronic diastolic heart failure.  Patient has diuresed reasonably well with improved symptoms, transitioning back to oral Lasix at this point with potassium supplements.  5.  CAD status post DES to the first diagonal and DES x2 to the LAD in February 2019.  No active chest pain.  Plavix has been on hold.  Would not resume Toprol-XL at all, follow heart rate going forward.  He may resume Plavix and Eliquis when okay with gastroenterology.  Several other outpatient medications have been on hold, blood pressure stable, and would consider resuming these gradually as an outpatient (he had been on lisinopril and Norvasc).  Otherwise changing to Lasix 80 mg twice daily with potassium supplements.  Signed, Rozann Lesches, MD  11/29/2018, 10:39 AM

## 2018-11-29 NOTE — Plan of Care (Signed)
  Problem: Education: Goal: Knowledge of General Education information will improve Description: Including pain rating scale, medication(s)/side effects and non-pharmacologic comfort measures Outcome: Adequate for Discharge   Problem: Health Behavior/Discharge Planning: Goal: Ability to manage health-related needs will improve Outcome: Adequate for Discharge   Problem: Clinical Measurements: Goal: Ability to maintain clinical measurements within normal limits will improve Outcome: Adequate for Discharge Goal: Will remain free from infection Outcome: Adequate for Discharge Goal: Diagnostic test results will improve Outcome: Adequate for Discharge Goal: Cardiovascular complication will be avoided Outcome: Adequate for Discharge   Problem: Activity: Goal: Risk for activity intolerance will decrease Outcome: Adequate for Discharge   Problem: Nutrition: Goal: Adequate nutrition will be maintained Outcome: Adequate for Discharge   Problem: Elimination: Goal: Will not experience complications related to bowel motility Outcome: Adequate for Discharge Goal: Will not experience complications related to urinary retention Outcome: Adequate for Discharge   Problem: Safety: Goal: Ability to remain free from injury will improve Outcome: Adequate for Discharge   Problem: Skin Integrity: Goal: Risk for impaired skin integrity will decrease Outcome: Adequate for Discharge   Problem: Education: Goal: Ability to identify signs and symptoms of gastrointestinal bleeding will improve Outcome: Adequate for Discharge   Problem: Bowel/Gastric: Goal: Will show no signs and symptoms of gastrointestinal bleeding Outcome: Adequate for Discharge   Problem: Fluid Volume: Goal: Will show no signs and symptoms of excessive bleeding Outcome: Adequate for Discharge   Problem: Clinical Measurements: Goal: Complications related to the disease process, condition or treatment will be avoided or  minimized Outcome: Adequate for Discharge

## 2018-11-29 NOTE — Discharge Summary (Signed)
Physician Discharge Summary  KARSON GORDAN [id number] DOB: 1933/08/21 DOA: 11/26/2018  PCP: Monico Blitz, MD  Admit date: 11/26/2018 Discharge date: 11/29/2018  Time spent: 35 minutes  Recommendations for Outpatient Follow-up:  1. Repeat CBC to follow hemoglobin trend 2. Repeat basic metabolic panel to follow electrolytes and renal function   Discharge Diagnoses:  Active Problems:   Acute on chronic diastolic CHF (congestive heart failure) (HCC)   Coronary artery disease involving native coronary artery of native heart without angina pectoris   Acute blood loss anemia   Orthostatic hypotension   Gastroesophageal reflux disease without esophagitis   Chronic respiratory failure with hypoxia (HCC) Type 2 diabetes with hyperglycemia  Discharge Condition: Stable and improved.  Patient discharged home with instruction to follow-up with gastroenterology, cardiology and PCP as an outpatient.  CODE STATUS remains DNR.  Diet recommendation: Heart healthy and modified carbohydrate diet.  Filed Weights   11/27/18 0430 11/28/18 0500 11/29/18 0641  Weight: 90.2 kg 87.5 kg 87.2 kg    History of present illness:  As per H&P by Dr. Manuella Ghazi on 11/26/2018 85 y.o.malewith medical history significant forCAD with prior stents, atrial fibrillation on Eliquis, GERD, type 2 diabetes, obstructive sleep apnea, COPD with chronic hypoxemia, chronic diastolic heart failure, and peripheral vascular disease who was recently hospitalized at [facility] rocking him on 8/10 and transfused 2 units of PRBCs. He has had problems with chronic anemia with prior endoscopiesthat have been unrevealing. He was attending a follow-up with his gastroenterologist Dr. Rosebud Poles when he was noted to be occult positive. He is also been experiencing some generalized fatigue and weakness that have been worse in the past few weeks and has been associated with some mild dyspnea. He apparently usually feels this way when he has  worsening levels of anemia. His weight has otherwise been stable and he has chronic lower extremity edema noted. He denies any chest pain, fevers, chills, cough, palpitations, lightheadedness, dizziness, nausea, or vomiting. No dark or bloody stools noted.  Patient was admitted with acute symptomatic blood loss anemia related to GI bleed along with slow atrial fibrillation and acute on chronic diastolic heart failure.  Hospital Course:  Acute blood loss anemia -Acute symptomatic blood loss anemia likely related to GI bleed -Continue holding Eliquis and Plavix until 12/03/2018 -Endoscopy and colonoscopy not demonstrating acute source of bleeding; patient will follow-up with GI for capsule endoscopy and further work-up after discharge. -Continue oral PPI -Patient is status post 1 unit PRBC transfused during this hospitalization -Continue to follow hemoglobin trend as an outpatient.   -Hemoglobin at discharge 10.1.  Slow atrial fibrillation-stable -Appreciate cardiology evaluation and holding of Toprol-XL for now -Norvasc was also discontinued and on discharge. -Overall heart rate has remained stable. -Continue patient follow-up with cardiology service. -Resume Eliquis as per GI recommendations on 12/03/2018.  Acute on chronic diastolic heart failure -Excellent response and stabilization to IV diuresis -Transition to oral Lasix as previously taking -Advised to follow low-sodium diet and to check his weight on daily basis. -Continue outpatient follow-up with cardiology service. -Repeat basic metabolic panel follow-up visit to assess electrolytes and renal function.  Type 2 diabetes with hypoglycemia  -CBGs are stable during hospitalization after D5 and advancing diet provided. -Continue outpatient hypoglycemic regimen and follow-up with PCP for further adjustments as needed.  CAD with prior stents -Continue holding Plavix and Eliquis -Will continue statins and Imdur -Patient denies  chest pain -Follow up with cardiology service after discharge.  Hypertension -stable overall and rising at discharge. -  Continue holding amlodipine and metoprolol for now. -Will resume Imdur, adjusted dose of lisinopril and resume home dose of Lasix.  -Advised to follow heart healthy diet -Follow vital signs and follow-up with cardiology service as an outpatient.   GERD -Continue oral PPI at discharge. -Follow up with GI as an outpatient.  OSA -Wears nasal cannula oxygen at night and will continue with that -Reports no PND.  PVD with prior toe amputations -Continue holding Plavix for now as well as Eliquis -Continue statin -Per GI service cleared to resume anticoagulation and Plavix on 12/03/2018.  COPD with chronic hypoxemia -No active bronchospasms currently noted -Reports overall improvement in his breathing after diuresis. -Continue 2 L nasal cannula O2 supplementation -Advised to resume the use of home inhaler regimen.  Procedures:  Endoscopy/colonoscopy: Revealing 2 duodenal polyps.  Smaller polyp was cold snare and the larger polyp was snare piecemeal biopsy and residual ablated with argon plasma coagulation.  Colonoscopy demonstrated multiple diverticuli in the sigmoid and descending colon.  No acute source of bleeding identified.  Consultations:  Cardiology service  Gastroenterologist.  Discharge Exam: Vitals:   11/28/18 2115 11/29/18 0620  BP: (!) 142/76 (!) 141/89  Pulse: (!) 45 (!) 54  Resp: 20 16  Temp: 98.2 F (36.8 C) 98.3 F (36.8 C)  SpO2: 93% 93%    General: Afebrile, no chest pain, reports no orthopnea.  Breathing comfortable raciness speaking in full sentences.  Patient is in 2 L nasal cannula supplementation (reports chronic use of oxygen at home intermittently). Cardiovascular: Soft systolic ejection murmur, rate control, no rubs, no gallops, no JVD on exam. Respiratory: Good air movement bilaterally, no using accessory muscles; normal  respiratory effort. Abdomen: Soft, nontender, nondistended, positive bowel sounds. Extremities: No cyanosis, no clubbing.  Discharge Instructions   Discharge Instructions    (HEART FAILURE PATIENTS) Call MD:  Anytime you have any of the following symptoms: 1) 3 pound weight gain in 24 hours or 5 pounds in 1 week 2) shortness of breath, with or without a dry hacking cough 3) swelling in the hands, feet or stomach 4) if you have to sleep on extra pillows at night in order to breathe.   Complete by: As directed    Diet - low sodium heart healthy   Complete by: As directed    Discharge instructions   Complete by: As directed    The medications are prescribed Follow heart healthy diet Check your weight on daily basis Follow-up with gastroenterologist and cardiology service as instructed Arrange follow-up with PCP in 10 days. Maintain adequate hydration.     Allergies as of 11/29/2018      Reactions   Bactrim [sulfamethoxazole-trimethoprim]    Pancytopenia   Codeine Shortness Of Breath   Heparin Other (See Comments)   +HIT,  Severe bleeding (with heparin drip & large doses)   Losartan Swelling   Per ENT UNSPECIFIED REACTION    Other Other (See Comments)   Severe bleeding UNSPECIFIED AGENT    Oxycodone Other (See Comments)   "Made me act out of my mind" Other reaction(s): Other (See Comments) Mental status changes hallucinations      Medication List    STOP taking these medications   amLODipine 10 MG tablet Commonly known as: NORVASC   metoprolol succinate 100 MG 24 hr tablet Commonly known as: TOPROL-XL   metoprolol tartrate 100 MG tablet Commonly known as: LOPRESSOR     TAKE these medications   acetaminophen 500 MG tablet Commonly known as: TYLENOL Take  500 mg by mouth as needed for mild pain or headache.   apixaban 5 MG Tabs tablet Commonly known as: Eliquis Take 1 tablet (5 mg total) by mouth 2 (two) times daily. Start taking on: December 03, 2018 What  changed: These instructions start on December 03, 2018. If you are unsure what to do until then, ask your doctor or other care provider.   atorvastatin 80 MG tablet Commonly known as: LIPITOR Take 1 tablet (80 mg total) by mouth every evening.   B-12 PO Take 1 tablet by mouth daily.   clopidogrel 75 MG tablet Commonly known as: PLAVIX Take 1 tablet (75 mg total) by mouth daily. Start taking on: December 03, 2018 What changed: These instructions start on December 03, 2018. If you are unsure what to do until then, ask your doctor or other care provider.   D3-1000 PO Take 1,000 Units by mouth daily.   folic acid 1 MG tablet Commonly known as: FOLVITE Take 1 tablet (1 mg total) by mouth daily.   furosemide 40 MG tablet Commonly known as: LASIX Take 80 mg by mouth 2 (two) times daily. Take 40 mg in the morning and 40 mg in the evening   insulin glargine 100 UNIT/ML injection Commonly known as: LANTUS Inject 30-40 Units into the skin See admin instructions. Inject 40 units SQ in the morning and inject 30 units SQ at bedtime   isosorbide mononitrate 30 MG 24 hr tablet Commonly known as: IMDUR Take 0.5 tablets (15 mg total) by mouth daily.   lisinopril 5 MG tablet Commonly known as: ZESTRIL Take 0.5 tablets (2.5 mg total) by mouth daily. What changed: how much to take   meclizine 12.5 MG tablet Commonly known as: ANTIVERT Take 1 tablet (12.5 mg total) by mouth 3 (three) times daily as needed for dizziness.   metFORMIN 1000 MG tablet Commonly known as: GLUCOPHAGE Take 1,000 mg by mouth 2 (two) times daily.   nitroGLYCERIN 0.4 MG SL tablet Commonly known as: NITROSTAT PLACE 1 TABLET (0.4 MG TOTAL) UNDER THE TONGUE EVERY 5  MINUTES AS NEEDED FOR CHEST PAIN. What changed: See the new instructions.   pantoprazole 40 MG tablet Commonly known as: Protonix Take 1 tablet (40 mg total) by mouth 2 (two) times daily. To protect stomach while taking multiple blood thinners. What  changed: when to take this   potassium chloride SA 20 MEQ tablet Commonly known as: K-DUR Take 1 tablet (20 mEq total) by mouth daily. Start taking on: November 30, 2018   psyllium 58.6 % packet Commonly known as: METAMUCIL Take 1 packet by mouth daily.   traZODone 100 MG tablet Commonly known as: DESYREL Take 100-200 mg by mouth at bedtime.      Allergies  Allergen Reactions  . Bactrim [Sulfamethoxazole-Trimethoprim]     Pancytopenia  . Codeine Shortness Of Breath  . Heparin Other (See Comments)    +HIT,  Severe bleeding (with heparin drip & large doses)  . Losartan Swelling    Per ENT UNSPECIFIED REACTION   . Other Other (See Comments)    Severe bleeding UNSPECIFIED AGENT   . Oxycodone Other (See Comments)    "Made me act out of my mind" Other reaction(s): Other (See Comments) Mental status changes hallucinations   Follow-up Information    Monico Blitz, MD. Schedule an appointment as soon as possible for a visit in 10 day(s).   Specialty: Internal Medicine Contact information: [address] [place] [address] [phone]  Arnoldo Lenis, MD .   Specialty: Cardiology Contact information: [place] [address] [phone]           The results of significant diagnostics from this hospitalization (including imaging, microbiology, ancillary and laboratory) are listed below for reference.    Significant Diagnostic Studies: Dg Chest Port 1 View  Result Date: 11/26/2018 CLINICAL DATA:  Shortness of breath and weakness. EXAM: PORTABLE CHEST 1 VIEW COMPARISON:  Ribs and chest x-ray 12/02/2017 FINDINGS: Heart size is normal. Mild pulmonary vascular congestion is present. There is mild atelectasis at the bases. Small effusions are present. Minimal atelectasis is present at both bases. No other significant consolidation is present. IMPRESSION: 1. Cardiomegaly with mild pulmonary vascular congestion and small effusions consistent  with congestive heart failure. Electronically Signed   By: San Morelle M.D.   On: 11/26/2018 11:20    Microbiology: Recent Results (from the past 240 hour(s))  SARS Coronavirus 2 [facility] order, Performed in [facility] hospital lab) Nasopharyngeal Nasopharyngeal Swab     Status: None   Collection Time: 11/26/18 10:38 AM   Specimen: Nasopharyngeal Swab  Result Value Ref Range Status   SARS Coronavirus 2 NEGATIVE NEGATIVE Final    Comment: (NOTE) If result is NEGATIVE SARS-CoV-2 target nucleic acids are NOT DETECTED. The SARS-CoV-2 RNA is generally detectable in upper and lower  respiratory specimens during the acute phase of infection. The lowest  concentration of SARS-CoV-2 viral copies this assay can detect is 250  copies / mL. A negative result does not preclude SARS-CoV-2 infection  and should not be used as the sole basis for treatment or other  patient management decisions.  A negative result may occur with  improper specimen collection / handling, submission of specimen other  than nasopharyngeal swab, presence of viral mutation(s) within the  areas targeted by this assay, and inadequate number of viral copies  (<250 copies / mL). A negative result must be combined with clinical  observations, patient history, and epidemiological information. If result is POSITIVE SARS-CoV-2 target nucleic acids are DETECTED. The SARS-CoV-2 RNA is generally detectable in upper and lower  respiratory specimens dur ing the acute phase of infection.  Positive  results are indicative of active infection with SARS-CoV-2.  Clinical  correlation with patient history and other diagnostic information is  necessary to determine patient infection status.  Positive results do  not rule out bacterial infection or co-infection with other viruses. If result is PRESUMPTIVE POSTIVE SARS-CoV-2 nucleic acids MAY BE PRESENT.   A presumptive positive result was obtained on the submitted specimen  and  confirmed on repeat testing.  While 2019 novel coronavirus  (SARS-CoV-2) nucleic acids may be present in the submitted sample  additional confirmatory testing may be necessary for epidemiological  and / or clinical management purposes  to differentiate between  SARS-CoV-2 and other Sarbecovirus currently known to infect humans.  If clinically indicated additional testing with an alternate test  methodology [phone]) is advised. The SARS-CoV-2 RNA is generally  detectable in upper and lower respiratory sp ecimens during the acute  phase of infection. The expected result is Negative. Fact Sheet for Patients:  StrictlyIdeas.no Fact Sheet for Healthcare Providers: BankingDealers.co.za This test is not yet approved or cleared by the [place] FDA and has been authorized for detection and/or diagnosis of SARS-CoV-2 by FDA under an Emergency Use Authorization (EUA).  This EUA will remain in effect (meaning this test can be used) for the duration of the  COVID-19 declaration under Section 564(b)(1) of the Act, 21 U.S.C. section 360bbb-3(b)(1), unless the authorization is terminated or revoked sooner. Performed at [facility], [address] [place], [place] [address]   MRSA PCR Screening     Status: Abnormal   Collection Time: 11/26/18  2:31 PM   Specimen: Nasal Mucosa; Nasopharyngeal  Result Value Ref Range Status   MRSA by PCR POSITIVE (A) NEGATIVE Final    Comment:        The GeneXpert MRSA Assay (FDA approved for NASAL specimens only), is one component of a comprehensive MRSA colonization surveillance program. It is not intended to diagnose MRSA infection nor to guide or monitor treatment for MRSA infections. RESULT CALLED TO, READ BACK BY AND VERIFIED WITH: TETREAULT,[email or web address]  BY MATTHEWS, B 9.1.2020 Performed at [facility], [address], [place], [place] [address]      Labs: Basic Metabolic Panel: Recent Labs  Lab  11/26/18 1036 11/27/18 0425 11/28/18 0545 11/28/18 1708  NA 139 139 140 137  K 3.9 3.2* 3.4* 3.6  CL 101 100 106 102  CO2 26 28 28 27   GLUCOSE 82 46* 113* 188*  BUN 28* 31* 21 17  CREATININE 1.36* 1.29* 1.13 1.05  CALCIUM 9.1 8.8* 9.0 8.7*  MG  --  2.1 2.1  --    Liver Function Tests: Recent Labs  Lab 11/26/18 1036  AST 21  ALT 15  ALKPHOS 64  BILITOT 0.7  PROT 7.1  ALBUMIN 4.1   Recent Labs  Lab 11/26/18 1036  LIPASE 29   CBC: Recent Labs  Lab 11/26/18 1036 11/27/18 0425 11/27/18 1402 11/28/18 0545 11/29/18 0431  WBC 7.3 6.3  --  5.4 5.7  NEUTROABS 4.6  --   --   --   --   HGB 8.2* 7.8* 9.3* 9.8* 10.1*  HCT 27.9* 26.4* 30.9* 32.6* 33.0*  MCV 77.3* 77.0*  --  77.4* 77.6*  PLT 193 176  --  161 163   BNP (last 3 results) Recent Labs    12/02/17 0950 11/26/18 1036  BNP 180.0* 428.0*   CBG: Recent Labs  Lab 11/28/18 1434 11/28/18 1740 11/28/18 2356 11/29/18 0620 11/29/18 1114  GLUCAP 114* 150* 148* 138* 168*    Signed:  Barton Dubois MD.  [facility] 11/29/2018, 3:57 PM

## 2018-11-29 NOTE — Progress Notes (Signed)
  Subjective:  Patient has no complaints.  He has not had a bowel movement since colonoscopy yesterday afternoon.  He denies abdominal pain or shortness of breath.  Objective: Blood pressure (!) 141/89, pulse (!) 54, temperature 98.3 F (36.8 C), temperature source Oral, resp. rate 16, height '5\' 7"'$  (1.702 m), weight 87.2 kg, SpO2 93 %. Patient is alert.  He does not appear pale anymore. Abdomen is full but soft and nontender with organomegaly or masses.  Labs/studies Results:  CBC Latest Ref Rng & Units 11/29/2018 11/28/2018 11/27/2018  WBC 4.0 - 10.5 K/uL 5.7 5.4 -  Hemoglobin 13.0 - 17.0 g/dL 10.1(L) 9.8(L) 9.3(L)  Hematocrit 39.0 - 52.0 % 33.0(L) 32.6(L) 30.9(L)  Platelets 150 - 400 K/uL 163 161 -    CMP Latest Ref Rng & Units 11/28/2018 11/28/2018 11/27/2018  Glucose 70 - 99 mg/dL 188(H) 113(H) 46(L)  BUN 8 - 23 mg/dL 17 21 31(H)  Creatinine 0.61 - 1.24 mg/dL 1.05 1.13 1.29(H)  Sodium 135 - 145 mmol/L 137 140 139  Potassium 3.5 - 5.1 mmol/L 3.6 3.4(L) 3.2(L)  Chloride 98 - 111 mmol/L 102 106 100  CO2 22 - 32 mmol/L '27 28 28  '$ Calcium 8.9 - 10.3 mg/dL 8.7(L) 9.0 8.8(L)  Total Protein 6.5 - 8.1 g/dL - - -  Total Bilirubin 0.3 - 1.2 mg/dL - - -  Alkaline Phos 38 - 126 U/L - - -  AST 15 - 41 U/L - - -  ALT 0 - 44 U/L - - -    Hepatic Function Latest Ref Rng & Units 11/26/2018 01/04/2018 12/13/2017  Total Protein 6.5 - 8.1 g/dL 7.1 8.2(H) 7.5  Albumin 3.5 - 5.0 g/dL 4.1 3.9 2.6(L)  AST 15 - 41 U/L 21 32 31  ALT 0 - 44 U/L '15 22 24  '$ Alk Phosphatase 38 - 126 U/L 64 97 110  Total Bilirubin 0.3 - 1.2 mg/dL 0.7 1.0 0.8  Bilirubin, Direct 0.0 - 0.2 mg/dL - - -    Duodenal polyp biopsy pending.  Assessment:  #1.  Iron deficiency anemia secondary to chronic GI blood loss in the setting of anticoagulation.  Patient underwent EGD and colonoscopy yesterday.  EGD revealed 2 duodenal polyps.  Smaller polyp was cold snare and the larger polyp was snared piecemeal biopsied and residual ablated with  argon plasma coagulation.  Colonoscopy revealed multiple diverticula in sigmoid and descending colon. Bleeding source not identified.  Therefore he will need to return for small bowel given capsule study on outpatient basis.  #2.  Abdominal pain and diarrhea.  He still had significant amount of stool at the time of colonoscopy.  Therefore may have spurious diarrhea and IBS.  #3.  Acute on chronic CHF.  Patient appears to be back at his baseline.  #4.  Atrial fibrillation.  On admission his heart rate is around 40.  Ventricular rate is now up to 50s and 60s.   Recommendations:  Resume anticoagulant on 12/02/2018. Metamucil 1 tablespoonful daily. I will be contacting patient with biopsy results. Small bowel given capsule study as an outpatient.[name]

## 2018-11-30 ENCOUNTER — Other Ambulatory Visit: Payer: Self-pay | Admitting: *Deleted

## 2018-11-30 NOTE — Patient Outreach (Signed)
[facility]) Care Management  11/30/2018  RIPLEY STADNIK [birth year] HC:[id number]   Telephone screening call   Referral date : 11/30/18 Referral source : Notification of discharge from inpatient admission  Referral Reason : Assess for transition of care needs Insurance: [facility]    Patient with recent admission to [facility]  8/31-9/3, with Acute on chronic heart failure, heme positive stools suspected GI bleed. Recent admission in August to [facility] and received PRBC 2 units transfused.   PMHX : includes Heart Failure, Diabetes A1c 6.1 on 11/26/18  Atrial Fib, OSA , COPD O2 at 2 liters at night only. Chronic anemia , PVD, CAD with stents.    Subjective : Outreach call to patient , HIPAA verified x 2 identifiers, explained purpose of the call and [facility] care management services. Patient is agreeable to [facility] care management services.   Patient discussed being glad to be at home he discussed his recent hospital admission to [facility] and [facility].    He discussed having a bleeding and not being sure where it is coming from states that he plans to have a capsule study to help determine cause of bleed. He discussed having polyps removed during colonoscopy . He denies complaint of nausea , abdominal pain or noted blood in stools. Patient discussed tolerating diet without complaints   He further discussed ;  Conditions  Heart failure : Patient reports daily weight is 183 on today, he denies increase in swelling, he reports getting a little winded when walking but that is usual for him. He only uses oxygen at night. Review of worsening symptoms of heart failure sudden weight gain, swelling shortness of breath patient denies these symptoms, will benefit from education on Heart failure zone education and action plan.  Atrial Fib : Patient denies having palpitations, he reports unable to monitor blood pressure/heart rate at this time due this machine is broken and he plans to  get another one.  Diabetes Patient discussed his blood sugar being 171 this morning and that is high for him he usually runs in the 140 in the am , and 180 in the evening .   Social  Patient lives at home with his wife that is able to assist as needed, she drives patient to appointments. Patient reports that he uses a cane and walker at home as needed for support.     Medications  Patient reports that he with the help of his wife manages his medications. He is unable to review full  medication list at this time, he is able to state understanding of medication changes at discharge including not resuming Plavix and Eliquis until 9/7 , he also states understanding of toprol and norvac on hold. He states that he and wife went through list received at discharge and marked his pill bottle with changes.  Patient reports concern related to cost of insulin stating he just received a refill of Lantus and paid $450 he states it was out of pocket, he says that with cost of eliquis is a bit much. Patient uses [employer] order service.    Appointments  Patient agreeable to calling PCP , cardiology for  follow up visit  GI office to call him for next visit   Explained to patient transition of care outreach and follow up plan, patient is agreeable. Provided patient with [facility] care management coordinator contact numbers.   Plan Will send PCP involvement barrier letter  Will send patient welcome letter  Will place  pharmacy referral related to cost concern for insulin and eliquis.  Will follow patient for weekly transition of care outreaches.    Joylene Draft, RN, [place] Management Coordinator  [phone]- Mobile [phone]- Toll Free Main Office

## 2018-12-01 DIAGNOSIS — Z7902 Long term (current) use of antithrombotics/antiplatelets: Secondary | ICD-10-CM | POA: Diagnosis not present

## 2018-12-01 DIAGNOSIS — Z9981 Dependence on supplemental oxygen: Secondary | ICD-10-CM | POA: Diagnosis not present

## 2018-12-01 DIAGNOSIS — I5033 Acute on chronic diastolic (congestive) heart failure: Secondary | ICD-10-CM | POA: Diagnosis not present

## 2018-12-01 DIAGNOSIS — I482 Chronic atrial fibrillation, unspecified: Secondary | ICD-10-CM | POA: Diagnosis not present

## 2018-12-01 DIAGNOSIS — I110 Hypertensive heart disease with heart failure: Secondary | ICD-10-CM | POA: Diagnosis not present

## 2018-12-01 DIAGNOSIS — D500 Iron deficiency anemia secondary to blood loss (chronic): Secondary | ICD-10-CM | POA: Diagnosis not present

## 2018-12-01 DIAGNOSIS — I2510 Atherosclerotic heart disease of native coronary artery without angina pectoris: Secondary | ICD-10-CM | POA: Diagnosis not present

## 2018-12-01 DIAGNOSIS — Z794 Long term (current) use of insulin: Secondary | ICD-10-CM | POA: Diagnosis not present

## 2018-12-01 DIAGNOSIS — E119 Type 2 diabetes mellitus without complications: Secondary | ICD-10-CM | POA: Diagnosis not present

## 2018-12-05 ENCOUNTER — Other Ambulatory Visit (INDEPENDENT_AMBULATORY_CARE_PROVIDER_SITE_OTHER): Payer: Self-pay | Admitting: *Deleted

## 2018-12-05 DIAGNOSIS — Z7902 Long term (current) use of antithrombotics/antiplatelets: Secondary | ICD-10-CM | POA: Diagnosis not present

## 2018-12-05 DIAGNOSIS — I482 Chronic atrial fibrillation, unspecified: Secondary | ICD-10-CM | POA: Diagnosis not present

## 2018-12-05 DIAGNOSIS — I110 Hypertensive heart disease with heart failure: Secondary | ICD-10-CM | POA: Diagnosis not present

## 2018-12-05 DIAGNOSIS — D500 Iron deficiency anemia secondary to blood loss (chronic): Secondary | ICD-10-CM | POA: Diagnosis not present

## 2018-12-05 DIAGNOSIS — Z9981 Dependence on supplemental oxygen: Secondary | ICD-10-CM | POA: Diagnosis not present

## 2018-12-05 DIAGNOSIS — I2510 Atherosclerotic heart disease of native coronary artery without angina pectoris: Secondary | ICD-10-CM | POA: Diagnosis not present

## 2018-12-05 DIAGNOSIS — K922 Gastrointestinal hemorrhage, unspecified: Secondary | ICD-10-CM

## 2018-12-05 DIAGNOSIS — Z794 Long term (current) use of insulin: Secondary | ICD-10-CM | POA: Diagnosis not present

## 2018-12-05 DIAGNOSIS — E119 Type 2 diabetes mellitus without complications: Secondary | ICD-10-CM | POA: Diagnosis not present

## 2018-12-05 DIAGNOSIS — I5033 Acute on chronic diastolic (congestive) heart failure: Secondary | ICD-10-CM | POA: Diagnosis not present

## 2018-12-06 DIAGNOSIS — I110 Hypertensive heart disease with heart failure: Secondary | ICD-10-CM | POA: Diagnosis not present

## 2018-12-06 DIAGNOSIS — Z7902 Long term (current) use of antithrombotics/antiplatelets: Secondary | ICD-10-CM | POA: Diagnosis not present

## 2018-12-06 DIAGNOSIS — I96 Gangrene, not elsewhere classified: Secondary | ICD-10-CM | POA: Diagnosis not present

## 2018-12-06 DIAGNOSIS — D500 Iron deficiency anemia secondary to blood loss (chronic): Secondary | ICD-10-CM | POA: Diagnosis not present

## 2018-12-06 DIAGNOSIS — Z794 Long term (current) use of insulin: Secondary | ICD-10-CM | POA: Diagnosis not present

## 2018-12-06 DIAGNOSIS — Z89412 Acquired absence of left great toe: Secondary | ICD-10-CM | POA: Diagnosis not present

## 2018-12-06 DIAGNOSIS — I482 Chronic atrial fibrillation, unspecified: Secondary | ICD-10-CM | POA: Diagnosis not present

## 2018-12-06 DIAGNOSIS — I5032 Chronic diastolic (congestive) heart failure: Secondary | ICD-10-CM | POA: Diagnosis not present

## 2018-12-06 DIAGNOSIS — E119 Type 2 diabetes mellitus without complications: Secondary | ICD-10-CM | POA: Diagnosis not present

## 2018-12-06 DIAGNOSIS — Z89411 Acquired absence of right great toe: Secondary | ICD-10-CM | POA: Diagnosis not present

## 2018-12-06 DIAGNOSIS — I2510 Atherosclerotic heart disease of native coronary artery without angina pectoris: Secondary | ICD-10-CM | POA: Diagnosis not present

## 2018-12-06 DIAGNOSIS — I5033 Acute on chronic diastolic (congestive) heart failure: Secondary | ICD-10-CM | POA: Diagnosis not present

## 2018-12-06 DIAGNOSIS — Z9981 Dependence on supplemental oxygen: Secondary | ICD-10-CM | POA: Diagnosis not present

## 2018-12-07 ENCOUNTER — Other Ambulatory Visit: Payer: Self-pay | Admitting: *Deleted

## 2018-12-07 DIAGNOSIS — D500 Iron deficiency anemia secondary to blood loss (chronic): Secondary | ICD-10-CM | POA: Diagnosis not present

## 2018-12-07 DIAGNOSIS — I4891 Unspecified atrial fibrillation: Secondary | ICD-10-CM | POA: Diagnosis not present

## 2018-12-07 DIAGNOSIS — E119 Type 2 diabetes mellitus without complications: Secondary | ICD-10-CM | POA: Diagnosis not present

## 2018-12-07 DIAGNOSIS — I482 Chronic atrial fibrillation, unspecified: Secondary | ICD-10-CM | POA: Diagnosis not present

## 2018-12-07 DIAGNOSIS — I5033 Acute on chronic diastolic (congestive) heart failure: Secondary | ICD-10-CM | POA: Diagnosis not present

## 2018-12-07 DIAGNOSIS — Z7902 Long term (current) use of antithrombotics/antiplatelets: Secondary | ICD-10-CM | POA: Diagnosis not present

## 2018-12-07 DIAGNOSIS — D6869 Other thrombophilia: Secondary | ICD-10-CM | POA: Diagnosis not present

## 2018-12-07 DIAGNOSIS — Z794 Long term (current) use of insulin: Secondary | ICD-10-CM | POA: Diagnosis not present

## 2018-12-07 DIAGNOSIS — D62 Acute posthemorrhagic anemia: Secondary | ICD-10-CM | POA: Diagnosis not present

## 2018-12-07 DIAGNOSIS — I2510 Atherosclerotic heart disease of native coronary artery without angina pectoris: Secondary | ICD-10-CM | POA: Diagnosis not present

## 2018-12-07 DIAGNOSIS — Z9981 Dependence on supplemental oxygen: Secondary | ICD-10-CM | POA: Diagnosis not present

## 2018-12-07 DIAGNOSIS — Z6829 Body mass index (BMI) 29.0-29.9, adult: Secondary | ICD-10-CM | POA: Diagnosis not present

## 2018-12-07 DIAGNOSIS — I110 Hypertensive heart disease with heart failure: Secondary | ICD-10-CM | POA: Diagnosis not present

## 2018-12-07 DIAGNOSIS — Z7901 Long term (current) use of anticoagulants: Secondary | ICD-10-CM | POA: Diagnosis not present

## 2018-12-07 DIAGNOSIS — K922 Gastrointestinal hemorrhage, unspecified: Secondary | ICD-10-CM | POA: Diagnosis not present

## 2018-12-07 NOTE — Patient Outreach (Signed)
[place] [facility]) Care Management  12/07/2018  Mike Wright 1933/08/11 [id number]   Transition of care call   Referral date : 11/30/18 Referral source : Notification of discharge from inpatient admission  Referral Reason : Assess for transition of care needs Insurance: [facility]    Patient with recent admission to [facility]  8/31-9/3, with Acute on chronic heart failure, heme positive stools suspected GI bleed. Recent admission in August to [facility] and received PRBC 2 units transfused.   PMHX : includes Heart Failure, Diabetes A1c 6.1 on 11/26/18  Atrial Fib, OSA , COPD O2 at 2 liters at night only. Chronic anemia , PVD, CAD with stents.   Subjective Unsuccessful outreach call to patient preferred contact number  , no answer, no voice mail pick, unable to leave a message    Plan  Will plan return call in the next 4 business days if no return call on today.    Joylene Draft, RN, [place] Management Coordinator  [phone]- Mobile [phone]- Toll Free Main Office

## 2018-12-10 ENCOUNTER — Other Ambulatory Visit: Payer: Self-pay | Admitting: Pharmacist

## 2018-12-10 DIAGNOSIS — I5033 Acute on chronic diastolic (congestive) heart failure: Secondary | ICD-10-CM | POA: Diagnosis not present

## 2018-12-10 DIAGNOSIS — I739 Peripheral vascular disease, unspecified: Secondary | ICD-10-CM | POA: Diagnosis not present

## 2018-12-10 DIAGNOSIS — I482 Chronic atrial fibrillation, unspecified: Secondary | ICD-10-CM | POA: Diagnosis not present

## 2018-12-10 DIAGNOSIS — K922 Gastrointestinal hemorrhage, unspecified: Secondary | ICD-10-CM | POA: Insufficient documentation

## 2018-12-10 DIAGNOSIS — E1151 Type 2 diabetes mellitus with diabetic peripheral angiopathy without gangrene: Secondary | ICD-10-CM | POA: Diagnosis not present

## 2018-12-10 DIAGNOSIS — Z299 Encounter for prophylactic measures, unspecified: Secondary | ICD-10-CM | POA: Diagnosis not present

## 2018-12-10 DIAGNOSIS — Z9981 Dependence on supplemental oxygen: Secondary | ICD-10-CM | POA: Diagnosis not present

## 2018-12-10 DIAGNOSIS — Z6828 Body mass index (BMI) 28.0-28.9, adult: Secondary | ICD-10-CM | POA: Diagnosis not present

## 2018-12-10 DIAGNOSIS — I5032 Chronic diastolic (congestive) heart failure: Secondary | ICD-10-CM | POA: Diagnosis not present

## 2018-12-10 DIAGNOSIS — I10 Essential (primary) hypertension: Secondary | ICD-10-CM | POA: Diagnosis not present

## 2018-12-10 DIAGNOSIS — I2510 Atherosclerotic heart disease of native coronary artery without angina pectoris: Secondary | ICD-10-CM | POA: Diagnosis not present

## 2018-12-10 DIAGNOSIS — E1165 Type 2 diabetes mellitus with hyperglycemia: Secondary | ICD-10-CM | POA: Diagnosis not present

## 2018-12-10 DIAGNOSIS — Z7902 Long term (current) use of antithrombotics/antiplatelets: Secondary | ICD-10-CM | POA: Diagnosis not present

## 2018-12-10 DIAGNOSIS — I110 Hypertensive heart disease with heart failure: Secondary | ICD-10-CM | POA: Diagnosis not present

## 2018-12-10 DIAGNOSIS — Z794 Long term (current) use of insulin: Secondary | ICD-10-CM | POA: Diagnosis not present

## 2018-12-10 DIAGNOSIS — E119 Type 2 diabetes mellitus without complications: Secondary | ICD-10-CM | POA: Diagnosis not present

## 2018-12-10 DIAGNOSIS — D500 Iron deficiency anemia secondary to blood loss (chronic): Secondary | ICD-10-CM | POA: Diagnosis not present

## 2018-12-10 NOTE — Patient Outreach (Signed)
[place] [facility]) Care Management [place]  12/10/2018  Mike Wright [birth year] [id number]  Reason for referral: medication assistance  [facility] pharmacy case is being closed due to the following reasons:  -Patient's PCP has PharmD on staff that will provide care to this patient. [facility] CMA to handoff case to [facility] Pharmacist Royal Piedra, PharmD [phone]).  No further follow up required by [place].   Regina Eck, PharmD, [place]  [phone]

## 2018-12-11 ENCOUNTER — Other Ambulatory Visit: Payer: Self-pay | Admitting: *Deleted

## 2018-12-11 ENCOUNTER — Encounter: Payer: Self-pay | Admitting: *Deleted

## 2018-12-11 NOTE — Patient Outreach (Signed)
[place] [facility]) Care Management  [place]  12/11/2018   Mike Wright November 04, 1933 [id number]   Transition of care   Referral date : 11/30/18 Referral source : Notification of discharge from inpatient admission  Referral Reason : Assess for transition of care needs Insurance: [facility]    Patient with recent admission to [facility]  8/31-9/3, with Acute on chronic heart failure, heme positive stools suspected GI bleed. Recent admission in August to [facility] and received PRBC 2 units transfused.   PMHX : includes Heart Failure, Diabetes A1c 6.1 on 11/26/18  Atrial Fib, OSA , COPD O2 at 2 liters at night only. Chronic anemia , PVD, CAD with stents.   Subjective:  Successful outreach call to patient , he reports that he is doing pretty good on today.  Patient discussed plans for camera study in next week to help identify bleeding source.  He discussed resuming eliquis and plavix , no noted bleeding .  Patient discussed Advanced home health RN and physical therapy visit at least once a week.    Objective: Ht 1.702 m (5\' 7" )   Wt 184 lb (83.5 kg)   BMI 28.82 kg/m   Encounter Medications:  Outpatient Encounter Medications as of 12/11/2018  Medication Sig Note  . acetaminophen (TYLENOL) 500 MG tablet Take 500 mg by mouth as needed for mild pain or headache.   [name] apixaban (ELIQUIS) 5 MG TABS tablet Take 1 tablet (5 mg total) by mouth 2 (two) times daily.   [name] atorvastatin (LIPITOR) 80 MG tablet Take 1 tablet (80 mg total) by mouth every evening.   . Cholecalciferol (D3-1000 PO) Take 1,000 Units by mouth daily.    . clopidogrel (PLAVIX) 75 MG tablet Take 1 tablet (75 mg total) by mouth daily.   . Cyanocobalamin (B-12 PO) Take 1 tablet by mouth daily.    . folic acid (FOLVITE) 1 MG tablet Take 1 tablet (1 mg total) by mouth daily.   . furosemide (LASIX) 40 MG tablet TAKE 1 TABLET TWICE DAILY  (DOSE  CHANGE)   . insulin glargine (LANTUS) 100 UNIT/ML injection  Inject 30-40 Units into the skin See admin instructions. Inject 40 units SQ in the morning and inject 30 units SQ at bedtime 11/18/2017: Last dose on 11/17/17 40 units  . isosorbide mononitrate (IMDUR) 30 MG 24 hr tablet Take 0.5 tablets (15 mg total) by mouth daily.   [name] lisinopril (ZESTRIL) 5 MG tablet Take 0.5 tablets (2.5 mg total) by mouth daily.   . meclizine (ANTIVERT) 12.5 MG tablet Take 1 tablet (12.5 mg total) by mouth 3 (three) times daily as needed for dizziness.   . metFORMIN (GLUCOPHAGE) 1000 MG tablet Take 1,000 mg by mouth 2 (two) times daily.   . nitroGLYCERIN (NITROSTAT) 0.4 MG SL tablet PLACE 1 TABLET (0.4 MG TOTAL) UNDER THE TONGUE EVERY 5  MINUTES AS NEEDED FOR CHEST PAIN. (Patient taking differently: Place 0.4 mg under the tongue every 5 (five) minutes as needed. )   . pantoprazole (PROTONIX) 40 MG tablet Take 1 tablet (40 mg total) by mouth 2 (two) times daily. To protect stomach while taking multiple blood thinners.   . potassium chloride SA (K-DUR) 20 MEQ tablet Take 1 tablet (20 mEq total) by mouth daily.   . psyllium (METAMUCIL) 58.6 % packet Take 1 packet by mouth daily.   . traZODone (DESYREL) 100 MG tablet Take 100-200 mg by mouth at bedtime.     No facility-administered encounter medications on  file as of 12/11/2018.     Functional Status:  In your present state of health, do you have any difficulty performing the following activities: 12/11/2018 11/26/2018  Hearing? Y N  Vision? Y N  Difficulty concentrating or making decisions? N N  Walking or climbing stairs? Y N  Comment using a walker -  Dressing or bathing? N N  Doing errands, shopping? Y N  Comment wife does the driving -  Conservation officer, nature and eating ? N -  Using the Toilet? N -  In the past six months, have you accidently leaked urine? Y -  Comment wears depends -  Do you have problems with loss of bowel control? N -  Managing your Medications? N -  Managing your Finances? N -  Housekeeping or managing  your Housekeeping? Y -  Comment wife does -  Some recent data might be hidden    Fall/Depression Screening: Fall Risk  12/11/2018  Falls in the past year? 1  Number falls in past yr: 1  Injury with Fall? 0  Risk for fall due to : History of fall(s);Impaired balance/gait  Follow up Falls prevention discussed   No flowsheet data found.  Assessment:   Heart failure - current weight today 184, he denies any sudden weight increase,no increase in swelling, he continues to states usual level of shortness of breath using oxygen only at night at times. Patient limiting fluid to 1.5 liters a day and not adding salt to foods.  Heme positive stool/GI bleed/Anemia Patient has planned capsule study in next week. Report normal bowel movement and color no noted blood in stool. Resumed Eliquis and Plavix as recommended.  Diabetes Continues to monitor blood sugars twice daily, reading this am 119, reports readings in range of 118 to 180 .  Patient scheduled to get flu shot at next PCP visit on 10/7 Patient has contacted office regarding scheduling eye exam he is awaiting call for appointment.  High fall risk  Uses walker, rollator or cane, reinforced continued use of assist device, review of fall prevention measures  and participation in home therapy exercises ,   Discussed with patient [place] follow up and plans to notify [facility] Pharmacist at PCP office regarding concerns with cost of Lantus and eliquis.  Patient discussed not having received prescription through [facility] mail order, yet had to purchase potassium at local pharmacy, reports having other medications on hand. He plans to contact pharmacy again today regarding delivery of medications.      Plan:  Will plan weekly transition of care outreach calls  Will send PCP initial visit note.  Reviewed fall prevention measure use of walker device, standing a few seconds prior to starting to walk.    THN CM Care Plan Problem One     Most  Recent Value  Care Plan Problem One  High risk for hospital readmission related to as evidenced by recent hospitalization for Acute on Chronic heart failure, atrial fib, heme positive stools   Role Documenting the Problem One  Care Management [place] for Problem One  Active  THN Long Term Goal   Patient will not experience a hospital readmission over the next 31 days   THN Long Term Goal Start Date  11/30/18  Interventions for Problem One Long Term Goal  Discussed current clinical state, review of symptoms of GI bleed to notify MD of .   THN CM Short Term Goal #1   Patient will be able to reports attending  all post discharge medical appointments over the next 30 days   THN CM Short Term Goal #1 Start Date  11/30/18  Interventions for Short Term Goal #1  Discussed recent PCP visit and plans for capsule study, wife to provide transportation.   THN CM Short Term Goal #2   Patient will be able to report continuing to monitor weights daily and keep a record over the next 30 days   THN CM Short Term Goal #2 Start Date  11/30/18  Interventions for Short Term Goal #2  Reviewed recent weights, encouraged continued monitoring of daily weights, reviewed best times to weigh.   THN CM Short Term Goal #3  Over the next 30 days patient will be able to identify at least 3 symptoms in yellow zone of heart failure   THN CM Short Term Goal #3 Start Date  12/11/18  Interventions for Short Tern Goal #3  Discussed Heart failure yellow zone and symptoms as well as action plan to notify MD sooner for sudden weight gain of 3 pounds in a day, 5 in a week, increased swelling shortness of breath  take medication as prescribed.. Verified patient has recieved heart failure education material and encouraged to review.       Joylene Draft, RN, [place] Management Coordinator  [phone]- Mobile [phone]- Toll Free Main Office

## 2018-12-13 DIAGNOSIS — I110 Hypertensive heart disease with heart failure: Secondary | ICD-10-CM | POA: Diagnosis not present

## 2018-12-13 DIAGNOSIS — Z7902 Long term (current) use of antithrombotics/antiplatelets: Secondary | ICD-10-CM | POA: Diagnosis not present

## 2018-12-13 DIAGNOSIS — I5033 Acute on chronic diastolic (congestive) heart failure: Secondary | ICD-10-CM | POA: Diagnosis not present

## 2018-12-13 DIAGNOSIS — D500 Iron deficiency anemia secondary to blood loss (chronic): Secondary | ICD-10-CM | POA: Diagnosis not present

## 2018-12-13 DIAGNOSIS — I2510 Atherosclerotic heart disease of native coronary artery without angina pectoris: Secondary | ICD-10-CM | POA: Diagnosis not present

## 2018-12-13 DIAGNOSIS — Z9981 Dependence on supplemental oxygen: Secondary | ICD-10-CM | POA: Diagnosis not present

## 2018-12-13 DIAGNOSIS — E119 Type 2 diabetes mellitus without complications: Secondary | ICD-10-CM | POA: Diagnosis not present

## 2018-12-13 DIAGNOSIS — Z794 Long term (current) use of insulin: Secondary | ICD-10-CM | POA: Diagnosis not present

## 2018-12-13 DIAGNOSIS — I482 Chronic atrial fibrillation, unspecified: Secondary | ICD-10-CM | POA: Diagnosis not present

## 2018-12-14 DIAGNOSIS — I2510 Atherosclerotic heart disease of native coronary artery without angina pectoris: Secondary | ICD-10-CM | POA: Diagnosis not present

## 2018-12-14 DIAGNOSIS — Z7902 Long term (current) use of antithrombotics/antiplatelets: Secondary | ICD-10-CM | POA: Diagnosis not present

## 2018-12-14 DIAGNOSIS — I482 Chronic atrial fibrillation, unspecified: Secondary | ICD-10-CM | POA: Diagnosis not present

## 2018-12-14 DIAGNOSIS — Z794 Long term (current) use of insulin: Secondary | ICD-10-CM | POA: Diagnosis not present

## 2018-12-14 DIAGNOSIS — I110 Hypertensive heart disease with heart failure: Secondary | ICD-10-CM | POA: Diagnosis not present

## 2018-12-14 DIAGNOSIS — E119 Type 2 diabetes mellitus without complications: Secondary | ICD-10-CM | POA: Diagnosis not present

## 2018-12-14 DIAGNOSIS — D500 Iron deficiency anemia secondary to blood loss (chronic): Secondary | ICD-10-CM | POA: Diagnosis not present

## 2018-12-14 DIAGNOSIS — Z9981 Dependence on supplemental oxygen: Secondary | ICD-10-CM | POA: Diagnosis not present

## 2018-12-14 DIAGNOSIS — I5033 Acute on chronic diastolic (congestive) heart failure: Secondary | ICD-10-CM | POA: Diagnosis not present

## 2018-12-17 ENCOUNTER — Ambulatory Visit (HOSPITAL_COMMUNITY)
Admission: RE | Admit: 2018-12-17 | Discharge: 2018-12-17 | Disposition: A | Discharge status: Home / Self Care | Payer: Medicare HMO | Attending: Internal Medicine | Admitting: Internal Medicine

## 2018-12-17 ENCOUNTER — Encounter (HOSPITAL_COMMUNITY)
Admission: RE | Disposition: A | Discharge status: Home / Self Care | Payer: Self-pay | Source: Home / Self Care | Attending: Internal Medicine

## 2018-12-17 DIAGNOSIS — D500 Iron deficiency anemia secondary to blood loss (chronic): Secondary | ICD-10-CM | POA: Diagnosis not present

## 2018-12-17 DIAGNOSIS — K219 Gastro-esophageal reflux disease without esophagitis: Secondary | ICD-10-CM | POA: Insufficient documentation

## 2018-12-17 DIAGNOSIS — K449 Diaphragmatic hernia without obstruction or gangrene: Secondary | ICD-10-CM | POA: Diagnosis not present

## 2018-12-17 DIAGNOSIS — Z89411 Acquired absence of right great toe: Secondary | ICD-10-CM | POA: Insufficient documentation

## 2018-12-17 DIAGNOSIS — R001 Bradycardia, unspecified: Secondary | ICD-10-CM | POA: Insufficient documentation

## 2018-12-17 DIAGNOSIS — Z9842 Cataract extraction status, left eye: Secondary | ICD-10-CM | POA: Diagnosis not present

## 2018-12-17 DIAGNOSIS — I2510 Atherosclerotic heart disease of native coronary artery without angina pectoris: Secondary | ICD-10-CM | POA: Insufficient documentation

## 2018-12-17 DIAGNOSIS — Z96653 Presence of artificial knee joint, bilateral: Secondary | ICD-10-CM | POA: Insufficient documentation

## 2018-12-17 DIAGNOSIS — G4730 Sleep apnea, unspecified: Secondary | ICD-10-CM | POA: Insufficient documentation

## 2018-12-17 DIAGNOSIS — Z888 Allergy status to other drugs, medicaments and biological substances status: Secondary | ICD-10-CM | POA: Diagnosis not present

## 2018-12-17 DIAGNOSIS — G4700 Insomnia, unspecified: Secondary | ICD-10-CM | POA: Diagnosis not present

## 2018-12-17 DIAGNOSIS — E782 Mixed hyperlipidemia: Secondary | ICD-10-CM | POA: Diagnosis not present

## 2018-12-17 DIAGNOSIS — Z8249 Family history of ischemic heart disease and other diseases of the circulatory system: Secondary | ICD-10-CM | POA: Insufficient documentation

## 2018-12-17 DIAGNOSIS — Z881 Allergy status to other antibiotic agents status: Secondary | ICD-10-CM | POA: Diagnosis not present

## 2018-12-17 DIAGNOSIS — Z7901 Long term (current) use of anticoagulants: Secondary | ICD-10-CM | POA: Diagnosis not present

## 2018-12-17 DIAGNOSIS — I480 Paroxysmal atrial fibrillation: Secondary | ICD-10-CM | POA: Insufficient documentation

## 2018-12-17 DIAGNOSIS — N289 Disorder of kidney and ureter, unspecified: Secondary | ICD-10-CM | POA: Diagnosis not present

## 2018-12-17 DIAGNOSIS — E1151 Type 2 diabetes mellitus with diabetic peripheral angiopathy without gangrene: Secondary | ICD-10-CM | POA: Diagnosis not present

## 2018-12-17 DIAGNOSIS — Z820 Family history of epilepsy and other diseases of the nervous system: Secondary | ICD-10-CM | POA: Insufficient documentation

## 2018-12-17 DIAGNOSIS — E039 Hypothyroidism, unspecified: Secondary | ICD-10-CM | POA: Diagnosis not present

## 2018-12-17 DIAGNOSIS — K922 Gastrointestinal hemorrhage, unspecified: Secondary | ICD-10-CM | POA: Diagnosis not present

## 2018-12-17 DIAGNOSIS — Z823 Family history of stroke: Secondary | ICD-10-CM | POA: Insufficient documentation

## 2018-12-17 DIAGNOSIS — I5032 Chronic diastolic (congestive) heart failure: Secondary | ICD-10-CM | POA: Diagnosis not present

## 2018-12-17 DIAGNOSIS — Z87891 Personal history of nicotine dependence: Secondary | ICD-10-CM | POA: Insufficient documentation

## 2018-12-17 DIAGNOSIS — M5030 Other cervical disc degeneration, unspecified cervical region: Secondary | ICD-10-CM | POA: Insufficient documentation

## 2018-12-17 DIAGNOSIS — Z981 Arthrodesis status: Secondary | ICD-10-CM | POA: Insufficient documentation

## 2018-12-17 DIAGNOSIS — D7582 Heparin induced thrombocytopenia (HIT): Secondary | ICD-10-CM | POA: Diagnosis not present

## 2018-12-17 DIAGNOSIS — Z86010 Personal history of colonic polyps: Secondary | ICD-10-CM | POA: Insufficient documentation

## 2018-12-17 DIAGNOSIS — Z885 Allergy status to narcotic agent status: Secondary | ICD-10-CM | POA: Insufficient documentation

## 2018-12-17 DIAGNOSIS — Z833 Family history of diabetes mellitus: Secondary | ICD-10-CM | POA: Insufficient documentation

## 2018-12-17 DIAGNOSIS — I110 Hypertensive heart disease with heart failure: Secondary | ICD-10-CM | POA: Insufficient documentation

## 2018-12-17 DIAGNOSIS — Z89412 Acquired absence of left great toe: Secondary | ICD-10-CM | POA: Insufficient documentation

## 2018-12-17 DIAGNOSIS — Z955 Presence of coronary angioplasty implant and graft: Secondary | ICD-10-CM | POA: Diagnosis not present

## 2018-12-17 DIAGNOSIS — Z825 Family history of asthma and other chronic lower respiratory diseases: Secondary | ICD-10-CM | POA: Insufficient documentation

## 2018-12-17 HISTORY — PX: GIVENS CAPSULE STUDY: SHX5432

## 2018-12-17 SURGERY — IMAGING PROCEDURE, GI TRACT, INTRALUMINAL, VIA CAPSULE

## 2018-12-17 NOTE — H&P (Signed)
Mike Wright is an 85 y.o. male.   Chief Complaint: Patient is here for small bowel given capsule study. HPI: Patient is an 85 year old Caucasian male who has a history of atrial fibrillation and is on an anticoagulant who was admitted about 3 weeks ago with GI bleed and anemia.  He did not require transfusion.  He underwent EGD and colonoscopy bleeding lesion was not identified.  He is therefore returning for small bowel given capsule study to complete his work-up.  Past Medical History:  Diagnosis Date  . Anemia    a. mild, noted 04/2017.  [name] CAD in native artery    a. [name] 04/2017 s/p DES to D1, DES to prox-mid LAD, DES to prox LAD overlapping the prior stent, LVEF 55-65%.   . Chronic diastolic CHF (congestive heart failure) ([place])   . Diabetic ulcer of toe ([place])   . DJD (degenerative joint disease) of cervical spine   . Essential hypertension   . GERD (gastroesophageal reflux disease)   . History of hiatal hernia   . HIT (heparin-induced thrombocytopenia) ([place])   . Hypothyroidism   . Hypoxia    a. went home on home O2 04/2017.  [name] Insomnia   . Mixed hyperlipidemia   . PAD (peripheral artery disease) ([place])   . PAF (paroxysmal atrial fibrillation) ([place])   . PVD (peripheral vascular disease) ([place])   . Renal insufficiency   . 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  . Sinus bradycardia    a. HR 30s-40s in 04/2017 -> diltiazem stopped, metoprolol reduced.  . Sleep apnea    "chose not to order CPAP at this time" (05/18/2017)  . Type 2 diabetes mellitus ([place])   . Wheezing    a. suspected COPD 04/2017. Former tobacco x 40 years.    Past Surgical History:  Procedure Laterality Date  . ABDOMINAL AORTOGRAM W/LOWER EXTREMITY N/A 09/15/2017   Procedure: ABDOMINAL AORTOGRAM W/LOWER EXTREMITY;  Surgeon: Serafina Mitchell, MD;  Location: [place] [facility];  Service: Cardiovascular;  Laterality: N/A;  . AMPUTATION Bilateral 09/20/2017   Procedure: BILATERAL GREAT TOE AMPUTATIONS INCLUDING  METATARSAL HEADS;  Surgeon: Serafina Mitchell, MD;  [facility];  Service: Vascular;  Laterality: Bilateral;  . ANGIOPLASTY Right 09/20/2017   Procedure: ANGIOPLASTY RIGHT TIBIAL ARTERY;  Surgeon: Serafina Mitchell, MD;  Location: [place];  Service: Vascular;  Laterality: Right;  . APPENDECTOMY    . BACK SURGERY    . CARDIAC CATHETERIZATION  1980s; 2012;  . CATARACT EXTRACTION Left 2004  . COLONOSCOPY  2004   FLEISHMAN TICS  . COLONOSCOPY WITH PROPOFOL N/A 11/28/2018   Procedure: COLONOSCOPY WITH PROPOFOL;  Surgeon: Rogene Houston, MD;  [facility];  Service: Endoscopy;  Laterality: N/A;  . CORONARY ANGIOPLASTY WITH STENT PLACEMENT  05/18/2017   "3 stents"  . CORONARY STENT INTERVENTION N/A 05/18/2017   Procedure: CORONARY STENT INTERVENTION;  Surgeon: Jettie Booze, MD;  Location: [place] [facility];  Service: Cardiovascular;  Laterality: N/A;  . ESOPHAGOGASTRODUODENOSCOPY (EGD) WITH PROPOFOL N/A 11/20/2017   Procedure: ESOPHAGOGASTRODUODENOSCOPY (EGD) WITH PROPOFOL;  Surgeon: Irene Shipper, MD;  Location: [place];  Service: Gastroenterology;  Laterality: N/A;  . ESOPHAGOGASTRODUODENOSCOPY (EGD) WITH PROPOFOL N/A 11/28/2018   Procedure: ESOPHAGOGASTRODUODENOSCOPY (EGD) WITH PROPOFOL;  Surgeon: Rogene Houston, MD;  [facility];  Service: Endoscopy;  Laterality: N/A;  . I&D EXTREMITY Right 12/17/2017   Procedure: IRRIGATION AND DEBRIDEMENT RIGHT GREAT TOE;  Surgeon: Harold Barban  W, MD;  Location: [place];  Service: Vascular;  Laterality: Right;  . JOINT REPLACEMENT    . LEFT HEART CATH AND CORONARY ANGIOGRAPHY N/A 05/18/2017   Procedure: LEFT HEART CATH AND CORONARY ANGIOGRAPHY;  Surgeon: Jettie Booze, MD;  Location: [place] [facility];  Service: Cardiovascular;  Laterality: N/A;  . LOWER EXTREMITY ANGIOGRAM Right 09/20/2017   Procedure: RIGHT LOWER LEG  ANGIOGRAM;  Surgeon: Serafina Mitchell, MD;  Location: [place];  Service: Vascular;  Laterality: Right;   . LUMBAR FUSION  2002   L3, 4 L4, 5 L5 S1 Fused by Dr. Glenna Fellows  . PERIPHERAL VASCULAR BALLOON ANGIOPLASTY Left 09/15/2017   Procedure: PERIPHERAL VASCULAR BALLOON ANGIOPLASTY;  Surgeon: Serafina Mitchell, MD;  Location: [place] [facility];  Service: Cardiovascular;  Laterality: Left;  PTA of Peroneal & Posterior Tibial  . POLYPECTOMY  11/28/2018   Procedure: POLYPECTOMY;  Surgeon: Rogene Houston, MD;  [facility];  Service: Endoscopy;;  duodenum  . POSTERIOR LUMBAR FUSION    . RHINOPLASTY    . RIGHT HEART CATH N/A 05/18/2017   Procedure: RIGHT HEART CATH;  Surgeon: J[place]ie Booze, MD;  Location: [place] [facility];  Service: Cardiovascular;  Laterality: N/A;  . TEE WITHOUT CARDIOVERSION N/A 09/18/2017   Procedure: TRANSESOPHAGEAL ECHOCARDIOGRAM (TEE);  Surgeon: Fay Records, MD;  Location: [place];  Service: Cardiovascular;  Laterality: N/A;  . TOTAL KNEE ARTHROPLASTY Bilateral   . TRANSURETHRAL RESECTION OF PROSTATE  2001   [place]    Family History  Problem Relation Age of Onset  . Heart attack Father 83  . COPD Father   . COPD Mother   . Heart disease Mother   . Diabetes Mother   . Hypertension Sister   . CVA Sister   . Diabetes Sister   . Multiple sclerosis Sister    Social History:  reports that he quit smoking about 35 years ago. His smoking use included cigarettes. He started smoking about 65 years ago. He has a 60.00 pack-year smoking history. He has never used smokeless tobacco. He reports previous alcohol use. He reports that he does not use drugs.  Allergies:  Allergies  Allergen Reactions  . Bactrim [Sulfamethoxazole-Trimethoprim]     Pancytopenia  . Codeine Shortness Of Breath  . Heparin Other (See Comments)    +HIT,  Severe bleeding (with heparin drip & large doses)  . Losartan Swelling    Per ENT UNSPECIFIED REACTION   . Other Other (See Comments)    Severe bleeding UNSPECIFIED AGENT   . Oxycodone Other (See Comments)    "Made me  act out of my mind" Other reaction(s): Other (See Comments) Mental status changes hallucinations    No medications prior to admission.    No results found for this or any previous visit (from the past 48 hour(s)). No results found.  ROS  Height 5\' 7"  (1.702 m), weight 84.4 kg. Physical Exam   Assessment/Plan Iron deficiency anemia secondary to chronic GI bleed. No bleeding lesion identified on recent EGD and colonoscopy revealing duodenal adenoma and colonic diverticulosis. Small bowel given capsule study.  Hildred Laser, MD 12/17/2018, 2:52 PM

## 2018-12-18 ENCOUNTER — Other Ambulatory Visit: Payer: Self-pay | Admitting: Pharmacist

## 2018-12-18 DIAGNOSIS — I5033 Acute on chronic diastolic (congestive) heart failure: Secondary | ICD-10-CM | POA: Diagnosis not present

## 2018-12-18 DIAGNOSIS — D500 Iron deficiency anemia secondary to blood loss (chronic): Secondary | ICD-10-CM | POA: Diagnosis not present

## 2018-12-18 DIAGNOSIS — I482 Chronic atrial fibrillation, unspecified: Secondary | ICD-10-CM | POA: Diagnosis not present

## 2018-12-18 DIAGNOSIS — Z7902 Long term (current) use of antithrombotics/antiplatelets: Secondary | ICD-10-CM | POA: Diagnosis not present

## 2018-12-18 DIAGNOSIS — Z9981 Dependence on supplemental oxygen: Secondary | ICD-10-CM | POA: Diagnosis not present

## 2018-12-18 DIAGNOSIS — E119 Type 2 diabetes mellitus without complications: Secondary | ICD-10-CM | POA: Diagnosis not present

## 2018-12-18 DIAGNOSIS — Z794 Long term (current) use of insulin: Secondary | ICD-10-CM | POA: Diagnosis not present

## 2018-12-18 DIAGNOSIS — I110 Hypertensive heart disease with heart failure: Secondary | ICD-10-CM | POA: Diagnosis not present

## 2018-12-18 DIAGNOSIS — I2510 Atherosclerotic heart disease of native coronary artery without angina pectoris: Secondary | ICD-10-CM | POA: Diagnosis not present

## 2018-12-18 NOTE — Patient Outreach (Signed)
[place] [facility]) Care Management  12/18/2018  Mike Wright Nov 30, 1933 [id number]  Phone call with Erlene Quan, pharmacist that patient's doctor office has contracted with. Erlene Quan states he will outreach patient to discuss insulin cost options and medication review.   Karrie Meres, PharmD, [place] [phone]

## 2018-12-19 ENCOUNTER — Other Ambulatory Visit (INDEPENDENT_AMBULATORY_CARE_PROVIDER_SITE_OTHER): Payer: Self-pay | Admitting: *Deleted

## 2018-12-19 ENCOUNTER — Encounter (HOSPITAL_COMMUNITY): Payer: Self-pay | Admitting: Internal Medicine

## 2018-12-19 DIAGNOSIS — K922 Gastrointestinal hemorrhage, unspecified: Secondary | ICD-10-CM

## 2018-12-20 ENCOUNTER — Encounter: Payer: Self-pay | Admitting: *Deleted

## 2018-12-20 ENCOUNTER — Other Ambulatory Visit: Payer: Self-pay | Admitting: *Deleted

## 2018-12-20 DIAGNOSIS — Z9981 Dependence on supplemental oxygen: Secondary | ICD-10-CM | POA: Diagnosis not present

## 2018-12-20 DIAGNOSIS — E119 Type 2 diabetes mellitus without complications: Secondary | ICD-10-CM | POA: Diagnosis not present

## 2018-12-20 DIAGNOSIS — Z7902 Long term (current) use of antithrombotics/antiplatelets: Secondary | ICD-10-CM | POA: Diagnosis not present

## 2018-12-20 DIAGNOSIS — D500 Iron deficiency anemia secondary to blood loss (chronic): Secondary | ICD-10-CM | POA: Diagnosis not present

## 2018-12-20 DIAGNOSIS — I2510 Atherosclerotic heart disease of native coronary artery without angina pectoris: Secondary | ICD-10-CM | POA: Diagnosis not present

## 2018-12-20 DIAGNOSIS — I482 Chronic atrial fibrillation, unspecified: Secondary | ICD-10-CM | POA: Diagnosis not present

## 2018-12-20 DIAGNOSIS — I110 Hypertensive heart disease with heart failure: Secondary | ICD-10-CM | POA: Diagnosis not present

## 2018-12-20 DIAGNOSIS — Z794 Long term (current) use of insulin: Secondary | ICD-10-CM | POA: Diagnosis not present

## 2018-12-20 DIAGNOSIS — I5033 Acute on chronic diastolic (congestive) heart failure: Secondary | ICD-10-CM | POA: Diagnosis not present

## 2018-12-20 NOTE — Patient Outreach (Signed)
[place] [facility]) Care Management  12/20/2018  RC SCHIRALDI 10/09/1933 [id number]   Transition of care call   Referral date : 11/30/18 Referral source : Notification of discharge from inpatient admission  Referral Reason : Assess for transition of care needs Insurance: [facility]   Patient with recent admission to [facility] 8/31-9/3, with Acute on chronic heart failure, heme positive stools suspected GI bleed. Recent admission in August to [facility] and received PRBC 2 units transfused.   PMHX : includes Heart Failure, Diabetes A1c 6.1 on 11/26/18 Atrial Fib, OSA , COPD O2 at 2 liters at night only. Chronic anemia , PVD, CAD with stents.   Subjective:  Successful outreach call to patient, he report doing pretty good.  He discussed recent capsule study results, that camera did not go all the way through it stopped at the colon. He discussed having a small series test on 9/29.  Patient reports having normal bowel movements no diarrhea no blood noted in stools. He is tolerating diet well.   Heart failure Patient continues to monitor daily weights and 183 today and 183.6 on yesterday. He denies shortness of breath or swelling increase,he continues to wear oxygen at night only .  Patient discussed home health RN final visit on yesterday and home physical therapy discharge visit on today.    Patient denies any new concerns at this time, confirmed receiving his mail order prescriptions from [facility].   Plan Will continue weekly calls for  transition of care outreaches, coworker will follow up with patient in the next week.     Joylene Draft, RN, [place] Management Coordinator  [phone]- Mobile [phone]- Toll Free Main Office

## 2018-12-21 DIAGNOSIS — Z9889 Other specified postprocedural states: Secondary | ICD-10-CM | POA: Diagnosis not present

## 2018-12-21 DIAGNOSIS — K5710 Diverticulosis of small intestine without perforation or abscess without bleeding: Secondary | ICD-10-CM | POA: Diagnosis not present

## 2018-12-21 DIAGNOSIS — D500 Iron deficiency anemia secondary to blood loss (chronic): Secondary | ICD-10-CM | POA: Diagnosis not present

## 2018-12-21 NOTE — Op Note (Signed)
Small Bowel [place] Capsule Study Procedure date: 12/17/2018.  Referring Provider:  Barton Dubois, MD PCP:  Dr. Monico Blitz, MD  Indication for procedure:   GI bleed of occult origin. Patient is 85 year old Caucasian male who is chronically anticoagulated and received blood transfusion few weeks ago and again when he was hospitalized earlier this month.  He underwent EGD and colonoscopy.  2 polyps were removed from duodenum and turned out to be tubular adenomas.  No bleeding lesion was identified.  Therefore patient was advised to return for outpatient small bowel given capsule study.   Findings:  Patient was able to swallow given capsule without any difficulty. Study duration 7 hours 49 minutes and 50 seconds. Studies and complete as capsule did not reach colon. Black eschar/small clot noted in post bulbar duodenum felt to be site of recent polypectomy without active bleeding. Multiple diverticula noted and what is felt to be jejunum and ileum without stigmata of bleed.  These diverticula can be seen on multiple images; some examples are images at 01:00:51, 01:41:42, 01:53:36 and 04:36:35.   First Gastric image: 46 sec First Duodenal image: 24 min and 26 sec First Ileo-Cecal Valve image: Not seen First Cecal image: Not seen Gastric Passage time: 23 min and 44 sec Small Bowel Passage time: Cannot be calculated as capsule did not reach cecum.  Summary & Recommendations:  Black eschar/small clot noted at duodenal polypectomy site without active bleeding. Multiple jejunal and ileal diverticula present but without stigmata of bleeding.  These may well be source of GI bleed. No fresh or old blood noted in small bowel. Study is incomplete because capsule did not reach cecum. Results have been reviewed with patient. He will return next week for H&H and small small bowel follow-through.

## 2018-12-25 ENCOUNTER — Other Ambulatory Visit (INDEPENDENT_AMBULATORY_CARE_PROVIDER_SITE_OTHER): Payer: Self-pay | Admitting: Internal Medicine

## 2018-12-25 ENCOUNTER — Ambulatory Visit (HOSPITAL_COMMUNITY)
Admission: RE | Admit: 2018-12-25 | Discharge: 2018-12-25 | Disposition: A | Discharge status: Home / Self Care | Payer: Medicare HMO | Source: Ambulatory Visit | Attending: Internal Medicine | Admitting: Internal Medicine

## 2018-12-25 ENCOUNTER — Other Ambulatory Visit: Payer: Self-pay

## 2018-12-25 DIAGNOSIS — K922 Gastrointestinal hemorrhage, unspecified: Secondary | ICD-10-CM

## 2018-12-25 LAB — HEMOGLOBIN AND HEMATOCRIT, BLOOD
HCT: 33.8 % — ABNORMAL LOW (ref 38.5–50.0)
Hemoglobin: 10.4 g/dL — ABNORMAL LOW (ref 13.2–17.1)

## 2018-12-26 ENCOUNTER — Other Ambulatory Visit: Payer: Self-pay | Admitting: Cardiology

## 2018-12-27 DIAGNOSIS — Z89411 Acquired absence of right great toe: Secondary | ICD-10-CM | POA: Diagnosis not present

## 2018-12-27 DIAGNOSIS — E261 Secondary hyperaldosteronism: Secondary | ICD-10-CM | POA: Diagnosis not present

## 2018-12-27 DIAGNOSIS — K922 Gastrointestinal hemorrhage, unspecified: Secondary | ICD-10-CM | POA: Diagnosis not present

## 2018-12-27 DIAGNOSIS — J9610 Chronic respiratory failure, unspecified whether with hypoxia or hypercapnia: Secondary | ICD-10-CM | POA: Diagnosis not present

## 2018-12-27 DIAGNOSIS — J449 Chronic obstructive pulmonary disease, unspecified: Secondary | ICD-10-CM | POA: Diagnosis not present

## 2018-12-27 DIAGNOSIS — D692 Other nonthrombocytopenic purpura: Secondary | ICD-10-CM | POA: Diagnosis not present

## 2018-12-27 DIAGNOSIS — Z89412 Acquired absence of left great toe: Secondary | ICD-10-CM | POA: Diagnosis not present

## 2018-12-27 DIAGNOSIS — I509 Heart failure, unspecified: Secondary | ICD-10-CM | POA: Diagnosis not present

## 2018-12-27 DIAGNOSIS — Z6829 Body mass index (BMI) 29.0-29.9, adult: Secondary | ICD-10-CM | POA: Diagnosis not present

## 2018-12-28 ENCOUNTER — Other Ambulatory Visit: Payer: Self-pay

## 2018-12-28 NOTE — Patient Outreach (Signed)
Transition of care:  Placed call to patient and wife answered and reports patient is driving.  Wife states that patient is doing well.  Patient denies any bleeding " that I can see" he said.  Reports no swelling and weight unchanged.  Wife denies any new problems or concerns.   PLAN: will update assigned case manager, Landis Martins and patient will get another follow up call in 1 week.  Tomasa Rand, RN, BSN, CEN [facility] [employer] [phone]

## 2018-12-31 ENCOUNTER — Other Ambulatory Visit: Payer: Self-pay

## 2018-12-31 NOTE — Patient Outreach (Signed)
[place] [facility]) Care Management  12/31/2018  ASHVIK SOSNOWSKI 1933-10-06 [id number]   Telephone call to patient for weekly TOC call.  Patient reports that he is doing well and has an appointment with his PCP on Wednesday. He drives or his wife drives him.  Patient denies any problems with active bleeding but reports that Hemoglobin drops and is currently at 10.4.  Discussed signs of active bleed.  He verbalized understanding.  Patient continues to weigh and record.  He states that his weight is relatively stable and last weight was 184 lbs.  Discussed heart failure zones and when to notify physician. Advised patient that this week is the last week of weekly calls and that CM will plan to call every other week for now.  Patient is agreeable.    Plan: RN CM will contact patient again within the month of October.    [name], RN, MSN [place] Management Care Management Coordinator Direct Line [phone] Cell [phone] Toll Free: [phone]  Fax: [phone]

## 2019-01-02 DIAGNOSIS — I739 Peripheral vascular disease, unspecified: Secondary | ICD-10-CM | POA: Diagnosis not present

## 2019-01-02 DIAGNOSIS — E1165 Type 2 diabetes mellitus with hyperglycemia: Secondary | ICD-10-CM | POA: Diagnosis not present

## 2019-01-02 DIAGNOSIS — Z6829 Body mass index (BMI) 29.0-29.9, adult: Secondary | ICD-10-CM | POA: Diagnosis not present

## 2019-01-02 DIAGNOSIS — I5032 Chronic diastolic (congestive) heart failure: Secondary | ICD-10-CM | POA: Diagnosis not present

## 2019-01-02 DIAGNOSIS — E1151 Type 2 diabetes mellitus with diabetic peripheral angiopathy without gangrene: Secondary | ICD-10-CM | POA: Diagnosis not present

## 2019-01-02 DIAGNOSIS — D649 Anemia, unspecified: Secondary | ICD-10-CM | POA: Diagnosis not present

## 2019-01-02 DIAGNOSIS — Z299 Encounter for prophylactic measures, unspecified: Secondary | ICD-10-CM | POA: Diagnosis not present

## 2019-01-02 DIAGNOSIS — I10 Essential (primary) hypertension: Secondary | ICD-10-CM | POA: Diagnosis not present

## 2019-01-05 DIAGNOSIS — I96 Gangrene, not elsewhere classified: Secondary | ICD-10-CM | POA: Diagnosis not present

## 2019-01-05 DIAGNOSIS — I5032 Chronic diastolic (congestive) heart failure: Secondary | ICD-10-CM | POA: Diagnosis not present

## 2019-01-05 DIAGNOSIS — Z89411 Acquired absence of right great toe: Secondary | ICD-10-CM | POA: Diagnosis not present

## 2019-01-05 DIAGNOSIS — Z89412 Acquired absence of left great toe: Secondary | ICD-10-CM | POA: Diagnosis not present

## 2019-01-11 DIAGNOSIS — Z23 Encounter for immunization: Secondary | ICD-10-CM | POA: Diagnosis not present

## 2019-01-17 ENCOUNTER — Other Ambulatory Visit: Payer: Self-pay

## 2019-01-17 NOTE — Patient Outreach (Signed)
[place] [facility]) Care Management  01/17/2019  INDIGO EMHOFF [birth year] [id number]   Telephone call to patient for follow up. Patient reports he is doing good and feels like he is in a good place right now. He continues to weigh daily and record.  He states weight is at 183 lbs.  He is now only using oxygen as needed.  He denies any swelling or shortness of breath. Patient continues to take medications as prescribed.  Discussed heart failure signs and symptoms.  Patient verbalized understanding.    Plan: RN CM will outreach patient next month and patient agreeable.  [name], RN, MSN [place] Management Care Management Coordinator Direct Line [phone] Cell [phone] Toll Free: [phone]  Fax: [phone]

## 2019-01-22 DIAGNOSIS — E113293 Type 2 diabetes mellitus with mild nonproliferative diabetic retinopathy without macular edema, bilateral: Secondary | ICD-10-CM | POA: Diagnosis not present

## 2019-01-25 ENCOUNTER — Telehealth (INDEPENDENT_AMBULATORY_CARE_PROVIDER_SITE_OTHER): Payer: Medicare HMO | Admitting: Cardiology

## 2019-01-25 ENCOUNTER — Encounter: Payer: Self-pay | Admitting: Cardiology

## 2019-01-25 VITALS — BP 118/76 | HR 75 | Ht 67.0 in | Wt 184.0 lb

## 2019-01-25 DIAGNOSIS — I4891 Unspecified atrial fibrillation: Secondary | ICD-10-CM

## 2019-01-25 DIAGNOSIS — I2510 Atherosclerotic heart disease of native coronary artery without angina pectoris: Secondary | ICD-10-CM | POA: Diagnosis not present

## 2019-01-25 DIAGNOSIS — I5032 Chronic diastolic (congestive) heart failure: Secondary | ICD-10-CM

## 2019-01-25 DIAGNOSIS — I10 Essential (primary) hypertension: Secondary | ICD-10-CM

## 2019-01-25 NOTE — Progress Notes (Signed)
Virtual Visit via Telephone Note   This visit type was conducted due to national recommendations for restrictions regarding the COVID-19 Pandemic (e.g. social distancing) in an effort to limit this patient's exposure and mitigate transmission in our community.  Due to his co-morbid illnesses, this patient is at least at moderate risk for complications without adequate follow up.  This format is felt to be most appropriate for this patient at this time.  The patient did not have access to video technology/had technical difficulties with video requiring transitioning to audio format only (telephone).  All issues noted in this document were discussed and addressed.  No physical exam could be performed with this format.  Please refer to the patient's chart for his  consent to telehealth for [facility].   Date:  01/25/2019   ID:  Mike Wright, Mike Wright 12-29-1933, MRN [id number]  Patient Location: Home Provider Location: Office  PCP:  Monico Blitz, MD  Cardiologist:  Carlyle Dolly, MD  Electrophysiologist:  None   Evaluation Performed:  Follow-Up Visit  Chief Complaint:  Follow up   History of Present Illness:    Mike Wright is a 84 y.o. male seen today for focused visit on recent issues with diastolic HF.    1. CAD - nonobstructive CAD by cath Jan 2012, LVEF 60-65% by LV gram.  - 06/2015 nuclear stress without clear ischemia - 05/2015 echo LVEF 65-70%, no WMAs, cannot evaluate diastolic function [id number] lexiscan without ischemia, low risk study.  -cath 04/2017 as reported below. Received DES to 75% D1, DES to 75% mid to distal LAD, DES to 70% prox LAD RHC with CI 2.67, mean PA 25, no wedge reported by LVEDP 14.  - discharged on triple therapy with ASA, plavix, eliquis with plan for 30 days, then stop ASA.   - 11/22/17 admission with chest pain, anemia. Negative workup for ACS. Symptomsresolved with blood transfusion - 11/2017 admit with chest pain and dyspnea. ACS work  negative, CT PE negative. Diuresed 6L with resolustion of symptoms. CXR with rib fracture thought playing a role in chest pain.     - mild chest pains at times, resolves with NG. Similar to his chronic pattern.     2. Chronic diastolic HF - recent admission 11/2018 with acute on chronic diastolic HF - no recent SOB/DOE. No recent edema. Takes lasix 40mg  bid - home weights down to 184 lbs.  Hospital discharge weight 191 lbs   3. Afib - admission 11/2018 with issues with afib - from notes had afib with slow rates, toprol was stopped - no significant palpitations. Home heart rates 70s and stable  4. GI bleeding - GI bleed during 11/2018, stopped eliquis at that time - heme +positive stools, negative endoscopy with plans for outpatient pill endoscopy  - back on eliquis and plavix. No recurrent bleeding  5. PAD - followed by vascular - admission 08/2017 with ischemia great toes. Had intervention on lower extremity vessels at that time.  -ultimately required ampuation bilateral great toes  - admit 11/2017 for poor healing right great toe amputation, critical limb ischemia - had I&D done of toe during that admission. Started on abx. - from 01/2018 vascular note wounds have nearly healed.  6. PSVT/Palpitations. -no recent symptoms    The patient does not have symptoms concerning for COVID-19 infection (fever, chills, cough, or new shortness of breath).    Past Medical History:  Diagnosis Date  . Anemia    a. mild, noted 04/2017.  [name]  CAD in native artery    a. [name] 04/2017 s/p DES to D1, DES to prox-mid LAD, DES to prox LAD overlapping the prior stent, LVEF 55-65%.   . Chronic diastolic CHF (congestive heart failure) ([place])   . Diabetic ulcer of toe ([place])   . DJD (degenerative joint disease) of cervical spine   . Essential hypertension   . GERD (gastroesophageal reflux disease)   . History of hiatal hernia   . HIT (heparin-induced thrombocytopenia) ([place])   .  Hypothyroidism   . Hypoxia    a. went home on home O2 04/2017.  [name] Insomnia   . Mixed hyperlipidemia   . PAD (peripheral artery disease) ([place])   . PAF (paroxysmal atrial fibrillation) ([place])   . PVD (peripheral vascular disease) ([place])   . Renal insufficiency   . 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  . Sinus bradycardia    a. HR 30s-40s in 04/2017 -> diltiazem stopped, metoprolol reduced.  . Sleep apnea    "chose not to order CPAP at this time" (05/18/2017)  . Type 2 diabetes mellitus ([place])   . Wheezing    a. suspected COPD 04/2017. Former tobacco x 40 years.   Past Surgical History:  Procedure Laterality Date  . ABDOMINAL AORTOGRAM W/LOWER EXTREMITY N/A 09/15/2017   Procedure: ABDOMINAL AORTOGRAM W/LOWER EXTREMITY;  Surgeon: Serafina Mitchell, MD;  Location: [facility];  Service: Cardiovascular;  Laterality: N/A;  . AMPUTATION Bilateral 09/20/2017   Procedure: BILATERAL GREAT TOE AMPUTATIONS INCLUDING METATARSAL HEADS;  Surgeon: Serafina Mitchell, MD;  [facility];  Service: Vascular;  Laterality: Bilateral;  . ANGIOPLASTY Right 09/20/2017   Procedure: ANGIOPLASTY RIGHT TIBIAL ARTERY;  Surgeon: Serafina Mitchell, MD;  Location: [facility];  Service: Vascular;  Laterality: Right;  . APPENDECTOMY    . BACK SURGERY    . CARDIAC CATHETERIZATION  1980s; 2012;  . CATARACT EXTRACTION Left 2004  . COLONOSCOPY  2004   FLEISHMAN TICS  . COLONOSCOPY WITH PROPOFOL N/A 11/28/2018   Procedure: COLONOSCOPY WITH PROPOFOL;  Surgeon: Rogene Houston, MD;  [facility];  Service: Endoscopy;  Laterality: N/A;  . CORONARY ANGIOPLASTY WITH STENT PLACEMENT  05/18/2017   "3 stents"  . CORONARY STENT INTERVENTION N/A 05/18/2017   Procedure: CORONARY STENT INTERVENTION;  Surgeon: Jettie Booze, MD;  Location: [facility];  Service: Cardiovascular;  Laterality: N/A;  . ESOPHAGOGASTRODUODENOSCOPY (EGD) WITH PROPOFOL N/A 11/20/2017   Procedure: ESOPHAGOGASTRODUODENOSCOPY (EGD) WITH  PROPOFOL;  Surgeon: Irene Shipper, MD;  Location: [facility];  Service: Gastroenterology;  Laterality: N/A;  . ESOPHAGOGASTRODUODENOSCOPY (EGD) WITH PROPOFOL N/A 11/28/2018   Procedure: ESOPHAGOGASTRODUODENOSCOPY (EGD) WITH PROPOFOL;  Surgeon: Rogene Houston, MD;  [facility];  Service: Endoscopy;  Laterality: N/A;  . GIVENS CAPSULE STUDY N/A 12/17/2018   Procedure: GIVENS CAPSULE STUDY;  Surgeon: Rogene Houston, MD;  [facility];  Service: Endoscopy;  Laterality: N/A;  7:30am  . I&D EXTREMITY Right 12/17/2017   Procedure: IRRIGATION AND DEBRIDEMENT RIGHT GREAT TOE;  Surgeon: Serafina Mitchell, MD;  [facility];  Service: Vascular;  Laterality: Right;  . JOINT REPLACEMENT    . LEFT HEART CATH AND CORONARY ANGIOGRAPHY N/A 05/18/2017   Procedure: LEFT HEART CATH AND CORONARY ANGIOGRAPHY;  Surgeon: Jettie Booze, MD;  Location: [facility];  Service: Cardiovascular;  Laterality: N/A;  . LOWER EXTREMITY ANGIOGRAM Right 09/20/2017   Procedure: RIGHT LOWER LEG  ANGIOGRAM;  Surgeon: Serafina Mitchell, MD;  Location: [facility];  Service: Vascular;  Laterality: Right;  . LUMBAR FUSION  2002   L3, 4 L4, 5 L5 S1 Fused by Dr. Glenna Fellows  . PERIPHERAL VASCULAR BALLOON ANGIOPLASTY Left 09/15/2017   Procedure: PERIPHERAL VASCULAR BALLOON ANGIOPLASTY;  Surgeon: Serafina Mitchell, MD;  Location: [facility];  Service: Cardiovascular;  Laterality: Left;  PTA of Peroneal & Posterior Tibial  . POLYPECTOMY  11/28/2018   Procedure: POLYPECTOMY;  Surgeon: Rogene Houston, MD;  [facility];  Service: Endoscopy;;  duodenum  . POSTERIOR LUMBAR FUSION    . RHINOPLASTY    . RIGHT HEART CATH N/A 05/18/2017   Procedure: RIGHT HEART CATH;  Surgeon: Jettie Booze, MD;  Location: [facility];  Service: Cardiovascular;  Laterality: N/A;  . TEE WITHOUT CARDIOVERSION N/A 09/18/2017   Procedure: TRANSESOPHAGEAL ECHOCARDIOGRAM (TEE);  Surgeon: Fay Records, MD;   Location: [facility];  Service: Cardiovascular;  Laterality: N/A;  . TOTAL KNEE ARTHROPLASTY Bilateral   . TRANSURETHRAL RESECTION OF PROSTATE  2001   [place]     Current Meds  Medication Sig  . acetaminophen (TYLENOL) 500 MG tablet Take 500 mg by mouth as needed for mild pain or headache.  [name] atorvastatin (LIPITOR) 80 MG tablet Take 1 tablet (80 mg total) by mouth every evening.  . Cholecalciferol (D3-1000 PO) Take 1,000 Units by mouth daily.   . clopidogrel (PLAVIX) 75 MG tablet Take 1 tablet (75 mg total) by mouth daily.  . Cyanocobalamin (B-12 PO) Take 1 tablet by mouth daily.   [name] ELIQUIS 5 MG TABS tablet TAKE 1 TABLET TWICE DAILY  . folic acid (FOLVITE) 1 MG tablet Take 1 tablet (1 mg total) by mouth daily.  . furosemide (LASIX) 40 MG tablet TAKE 1 TABLET TWICE DAILY  (DOSE  CHANGE)  . insulin glargine (LANTUS) 100 UNIT/ML injection Inject 30-40 Units into the skin See admin instructions. Inject 40 units SQ in the morning and inject 30 units SQ at bedtime  . isosorbide mononitrate (IMDUR) 30 MG 24 hr tablet Take 0.5 tablets (15 mg total) by mouth daily.  [name] lisinopril (ZESTRIL) 5 MG tablet Take 0.5 tablets (2.5 mg total) by mouth daily.  . meclizine (ANTIVERT) 12.5 MG tablet Take 1 tablet (12.5 mg total) by mouth 3 (three) times daily as needed for dizziness.  . metFORMIN (GLUCOPHAGE) 1000 MG tablet Take 1,000 mg by mouth 2 (two) times daily.  . nitroGLYCERIN (NITROSTAT) 0.4 MG SL tablet PLACE 1 TABLET (0.4 MG TOTAL) UNDER THE TONGUE EVERY 5  MINUTES AS NEEDED FOR CHEST PAIN. (Patient taking differently: Place 0.4 mg under the tongue every 5 (five) minutes as needed. )  . pantoprazole (PROTONIX) 40 MG tablet Take 1 tablet (40 mg total) by mouth 2 (two) times daily. To protect stomach while taking multiple blood thinners.  . potassium chloride SA (K-DUR) 20 MEQ tablet Take 1 tablet (20 mEq total) by mouth daily.  . psyllium (METAMUCIL) 58.6 % packet Take 1 packet by mouth daily.  .  traZODone (DESYREL) 100 MG tablet Take 100-200 mg by mouth at bedtime.      Allergies:   Bactrim [sulfamethoxazole-trimethoprim], Codeine, Heparin, Losartan, Other, and Oxycodone   Social History   Tobacco Use  . Smoking status: Former Smoker    Packs/day: 2.00    Years: 30.00    Pack years: 60.00    Types: Cigarettes    Start date: 03/28/1953  Quit date: 03/29/1983    Years since quitting: 35.8  . Smokeless tobacco: Never Used  Substance Use Topics  . Alcohol use: Not Currently    Comment: 05/18/2017 'I drink a beer q yr"  . Drug use: No     Family Hx: The patient's family history includes COPD in his father and mother; CVA in his sister; Diabetes in his mother and sister; Heart attack (age of onset: 76) in his father; Heart disease in his mother; Hypertension in his sister; Multiple sclerosis in his sister.  ROS:   Please see the history of present illness.     All other systems reviewed and are negative.   Prior CV studies:   The following studies were reviewed today:  05/2015 echo Study Conclusions  - Left ventricle: The cavity size was normal. Wall thickness was increased increased in a pattern of mild to moderate LVH. Systolic function was vigorous. The estimated ejection fraction was in the range of 65% to 70%. Wall motion was normal; there were no regional wall motion abnormalities. The study is not technically sufficient to allow evaluation of LV diastolic function. - Aortic valve: Moderately calcified annulus. Mildly thickened leaflets. There was mild stenosis. There was mild regurgitation. Mean gradient (S): 13 mm Hg. Valve area (VTI): 1.51 cm^2. - Mitral valve: Mildly calcified annulus. Normal thickness leaflets . - Left atrium: The atrium was moderately dilated. - Technically adequate study.   06/2015 Nuclear stress test  No diagnostic ST segment changes to indicate ischemia.  Small, mild intensity, perfusion defects noted in the  apical anterior and basal inferolateral walls. This is most consistent with soft tissue attenuation given normal wall motion in these regions. No large ischemic zones noted.  This is a low risk study.  Nuclear stress EF: 64%.  05/2016 Event monitor  Rhythm is atrial fibrillation throughout study. Occasioanal PVCs  Min HR 51, Max HR 107, Avg HR 67  Reported symptoms correspond with rate controlled atrial fibrillation.  09/2016 nuclear stress  There was no ST segment deviation noted during stress.  The study is normal. There are no perfusion defects consistent with prior infarct or current ischemia.  This is a low risk study.  The left ventricular ejection fraction is normal (55-65%).   04/2017 cath  Dist RCA lesion is 40% stenosed.  Prox RCA lesion is 25% stenosed.  1st Diag lesion is 75% stenosed.  A drug-eluting stent was successfully placed using a STENT SYNERGY DES 2.25X12.  Post intervention, there is a 0% residual stenosis.  Prox LAD to Mid LAD lesion is 75% stenosed.  A drug-eluting stent was successfully placed using a STENT SYNERGY DES 3.5X38.  Post intervention, there is a 0% residual stenosis.  Prox LAD lesion is 70% stenosed.  A drug-eluting stent was successfully placed using a STENT SYNERGY DES 4X12, overlapping the prior stent.  Post intervention, there is a 0% residual stenosis.  The left ventricular systolic function is normal.  The left ventricular ejection fraction is 55-65% by visual estimate.  LV end diastolic pressure is normal.  There is no aortic valve stenosis.  Ao sat 98%, PA sat 65%; PA mean 25 mm Hg; unable to wedge the catheter.  Normal right heat pressures.  Tortuous right subclavian making catheter navigation difficult from the right radial approach.  Continue dual antiplatelet therapy along with Eliquis for 30 days.  Restart Eliquis tomorrow.  After 30 days, stop aspirin. Continue Plavix and Eliquis.   After 6  months, can consider stopping  clopidogrel if he has bleeding issues. Given the nomber of stents, would try to complete one year of clopidogrel if no bleeding issues.  Labs/Other Tests and Data Reviewed:    EKG:  No ECG reviewed.  Recent Labs: 11/26/2018: ALT 15; B Natriuretic Peptide 428.0 11/28/2018: BUN 17; Creatinine, Ser 1.05; Magnesium 2.1; Potassium 3.6; Sodium 137 11/29/2018: Platelets 163 12/25/2018: Hemoglobin 10.4   Recent Lipid Panel Lab Results  Component Value Date/Time   CHOL 65 11/18/2017 04:21 AM   TRIG 85 11/18/2017 04:21 AM   HDL 16 (L) 11/18/2017 04:21 AM   CHOLHDL 4.1 11/18/2017 04:21 AM   LDLCALC 32 11/18/2017 04:21 AM    Wt Readings from Last 3 Encounters:  01/25/19 184 lb (83.5 kg)  12/28/18 184 lb (83.5 kg)  12/17/18 186 lb (84.4 kg)     Objective:    Vital Signs:  BP 118/76   Pulse 75   Ht 5\' 7"  (1.702 m)   Wt 184 lb (83.5 kg)   SpO2 96%   BMI 28.82 kg/m    Normal affect. Normal speech pattern and tone. Comfortbale, no apparent distress.   ASSESSMENT & PLAN:    1.CAD -PCI as reported above 04/2017 -chronci stable symptoms unchanged. Of note he often has chest pain in the setting of volume overload that resolves with diuresis - continue current meds. From cardaic standpoint would be ok to come off plavix, we will discuss with vascular. He has had issues with GI bleeding.    2. Chronic diastolic HF - doing well based on his report, weights are stable - continue diuretic  3. Afib - off av nodal agents due to bradycardia. Rates 70s by his routine home checks, no symptoms - back on eliquis after recent GI bleed with no recurrent bleeding, H&H improving by last check - continue current meds   4. HTN - at goal, continue current meds   COVID-19 Education: The signs and symptoms of COVID-19 were discussed with the patient and how to seek care for testing (follow up with PCP or arrange E-visit).  The importance of social distancing was  discussed today.  Time:   Today, I have spent 24 minutes with the patient with telehealth technology discussing the above problems.     Medication Adjustments/Labs and Tests Ordered: Current medicines are reviewed at length with the patient today.  Concerns regarding medicines are outlined above.   Tests Ordered: No orders of the defined types were placed in this encounter.   Medication Changes: No orders of the defined types were placed in this encounter.   Follow Up:  In Person in 2 month(s)  Signed, Carlyle Dolly, MD  01/25/2019 10:23 AM    [place]

## 2019-01-25 NOTE — Patient Instructions (Signed)
Medication Instructions:  Continue all current medications.  Labwork: none  Testing/Procedures: none  Follow-Up: 2 months   Any Other Special Instructions Will Be Listed Below (If Applicable).  If you need a refill on your cardiac medications before your next appointment, please call your pharmacy.  

## 2019-01-28 ENCOUNTER — Other Ambulatory Visit: Payer: Self-pay | Admitting: Cardiology

## 2019-01-28 DIAGNOSIS — E11319 Type 2 diabetes mellitus with unspecified diabetic retinopathy without macular edema: Secondary | ICD-10-CM | POA: Diagnosis not present

## 2019-01-28 DIAGNOSIS — Z89429 Acquired absence of other toe(s), unspecified side: Secondary | ICD-10-CM | POA: Diagnosis not present

## 2019-01-28 DIAGNOSIS — E7800 Pure hypercholesterolemia, unspecified: Secondary | ICD-10-CM | POA: Diagnosis not present

## 2019-01-28 DIAGNOSIS — E1151 Type 2 diabetes mellitus with diabetic peripheral angiopathy without gangrene: Secondary | ICD-10-CM | POA: Diagnosis not present

## 2019-01-28 DIAGNOSIS — E1165 Type 2 diabetes mellitus with hyperglycemia: Secondary | ICD-10-CM | POA: Diagnosis not present

## 2019-01-28 DIAGNOSIS — I10 Essential (primary) hypertension: Secondary | ICD-10-CM | POA: Diagnosis not present

## 2019-01-28 DIAGNOSIS — Z299 Encounter for prophylactic measures, unspecified: Secondary | ICD-10-CM | POA: Diagnosis not present

## 2019-01-28 DIAGNOSIS — I739 Peripheral vascular disease, unspecified: Secondary | ICD-10-CM | POA: Diagnosis not present

## 2019-01-28 DIAGNOSIS — I5032 Chronic diastolic (congestive) heart failure: Secondary | ICD-10-CM | POA: Diagnosis not present

## 2019-01-28 DIAGNOSIS — Z6829 Body mass index (BMI) 29.0-29.9, adult: Secondary | ICD-10-CM | POA: Diagnosis not present

## 2019-01-30 ENCOUNTER — Other Ambulatory Visit (INDEPENDENT_AMBULATORY_CARE_PROVIDER_SITE_OTHER): Payer: Self-pay | Admitting: *Deleted

## 2019-01-30 DIAGNOSIS — K922 Gastrointestinal hemorrhage, unspecified: Secondary | ICD-10-CM

## 2019-01-31 ENCOUNTER — Other Ambulatory Visit: Payer: Self-pay

## 2019-01-31 ENCOUNTER — Encounter (INDEPENDENT_AMBULATORY_CARE_PROVIDER_SITE_OTHER): Payer: Medicare HMO | Admitting: Ophthalmology

## 2019-01-31 DIAGNOSIS — I10 Essential (primary) hypertension: Secondary | ICD-10-CM | POA: Diagnosis not present

## 2019-01-31 DIAGNOSIS — H353111 Nonexudative age-related macular degeneration, right eye, early dry stage: Secondary | ICD-10-CM

## 2019-01-31 DIAGNOSIS — H43813 Vitreous degeneration, bilateral: Secondary | ICD-10-CM

## 2019-01-31 DIAGNOSIS — H35033 Hypertensive retinopathy, bilateral: Secondary | ICD-10-CM

## 2019-01-31 DIAGNOSIS — H353122 Nonexudative age-related macular degeneration, left eye, intermediate dry stage: Secondary | ICD-10-CM | POA: Diagnosis not present

## 2019-02-05 DIAGNOSIS — Z89411 Acquired absence of right great toe: Secondary | ICD-10-CM | POA: Diagnosis not present

## 2019-02-05 DIAGNOSIS — Z89412 Acquired absence of left great toe: Secondary | ICD-10-CM | POA: Diagnosis not present

## 2019-02-05 DIAGNOSIS — I96 Gangrene, not elsewhere classified: Secondary | ICD-10-CM | POA: Diagnosis not present

## 2019-02-05 DIAGNOSIS — I5032 Chronic diastolic (congestive) heart failure: Secondary | ICD-10-CM | POA: Diagnosis not present

## 2019-02-07 ENCOUNTER — Telehealth: Payer: Self-pay | Admitting: *Deleted

## 2019-02-07 NOTE — Telephone Encounter (Signed)
Pt voiced understanding - updated medication list 

## 2019-02-07 NOTE — Telephone Encounter (Signed)
-----   Message from Arnoldo Lenis, MD sent at 02/07/2019  9:47 AM EST ----- Can you let patient know ok to stop his plavix, we heard back from vascular   Zandra Abts MD ----- Message ----- From: Serafina Mitchell, MD Sent: 01/30/2019   7:12 PM EST To: Arnoldo Lenis, MD  I am ok with that ----- Message ----- From: Arnoldo Lenis, MD Sent: 01/25/2019  11:03 AM EST To: Serafina Mitchell, MD  Mutual patient, would you be ok with him coming off plavix and staying on eliquis alone. Has had some recent issues with GI bleeding, from cardiac standpoitn we would be ok with just eliquis since so far out from his stent. Wanted your thoughts based on his prior lower extremity intervention    Zandra Abts MD

## 2019-02-13 ENCOUNTER — Other Ambulatory Visit: Payer: Self-pay

## 2019-02-13 DIAGNOSIS — K922 Gastrointestinal hemorrhage, unspecified: Secondary | ICD-10-CM | POA: Diagnosis not present

## 2019-02-13 LAB — CBC
HCT: 30.4 % — ABNORMAL LOW (ref 38.5–50.0)
Hemoglobin: 9.7 g/dL — ABNORMAL LOW (ref 13.2–17.1)
MCH: 25.5 pg — ABNORMAL LOW (ref 27.0–33.0)
MCHC: 31.9 g/dL — ABNORMAL LOW (ref 32.0–36.0)
MCV: 79.8 fL — ABNORMAL LOW (ref 80.0–100.0)
MPV: 9.2 fL (ref 7.5–12.5)
Platelets: 159 10*3/uL (ref 140–400)
RBC: 3.81 10*6/uL — ABNORMAL LOW (ref 4.20–5.80)
RDW: 19.6 % — ABNORMAL HIGH (ref 11.0–15.0)
WBC: 6.3 10*3/uL (ref 3.8–10.8)

## 2019-02-13 NOTE — Patient Outreach (Signed)
[facility]) Care Management  02/13/2019  ADIL CIOCCA Nov 18, 1933 [id number]   Telephone call to patient for follow up..  No answer.  HIPAA compliant voice message left.  Plan: RN CM will attempt patient again within 4 business days and send letter.    [name], RN, MSN [place] Management Care Management Coordinator Direct Line [phone] Cell [phone] Toll Free: [phone]  Fax: [phone]

## 2019-02-14 ENCOUNTER — Other Ambulatory Visit: Payer: Self-pay

## 2019-02-14 ENCOUNTER — Ambulatory Visit: Payer: Self-pay

## 2019-02-14 ENCOUNTER — Other Ambulatory Visit (INDEPENDENT_AMBULATORY_CARE_PROVIDER_SITE_OTHER): Payer: Self-pay | Admitting: Internal Medicine

## 2019-02-14 MED ORDER — FLINTSTONES COMPLETE 18 MG PO CHEW: 1.0000 | CHEWABLE_TABLET | Freq: Two times a day (BID) | ORAL | Status: DC

## 2019-02-14 NOTE — Patient Outreach (Signed)
[facility]) Care Management  [place]  02/14/2019   Mike Wright 03-19-1934 [id number]  Subjective: Telephone call to patient for check in. Patient reports he is doing good.  He reports recent eye exam where he was told her had bleeding behind his left eye and he saw Dr. Zigmund Daniel and was told that there was nothing that could be done and that he would probably lose sight in his left eye. He is very positive about and states that the goal is to conserve vision in his right eye.  Patient continues to weigh and record daily with most recent weight 185 lbs. Discussed heart failure and encouraged patient to continue regimen.  He verbalized understanding and denies any needs.     Objective:   Encounter Medications:  Outpatient Encounter Medications as of 02/14/2019  Medication Sig Note  . acetaminophen (TYLENOL) 500 MG tablet Take 500 mg by mouth as needed for mild pain or headache.   [name] atorvastatin (LIPITOR) 80 MG tablet Take 1 tablet (80 mg total) by mouth every evening.   . Cholecalciferol (D3-1000 PO) Take 1,000 Units by mouth daily.    . Cyanocobalamin (B-12 PO) Take 1 tablet by mouth daily.    [name] ELIQUIS 5 MG TABS tablet TAKE 1 TABLET TWICE DAILY   . folic acid (FOLVITE) 1 MG tablet Take 1 tablet (1 mg total) by mouth daily.   . furosemide (LASIX) 40 MG tablet TAKE 1 TABLET TWICE DAILY  (DOSE  CHANGE)   . insulin glargine (LANTUS) 100 UNIT/ML injection Inject 30-40 Units into the skin See admin instructions. Inject 40 units SQ in the morning and inject 30 units SQ at bedtime 11/18/2017: Last dose on 11/17/17 40 units  . isosorbide mononitrate (IMDUR) 30 MG 24 hr tablet Take 0.5 tablets (15 mg total) by mouth daily.   [name] lisinopril (ZESTRIL) 5 MG tablet Take 0.5 tablets (2.5 mg total) by mouth daily.   . meclizine (ANTIVERT) 12.5 MG tablet Take 1 tablet (12.5 mg total) by mouth 3 (three) times daily as needed for dizziness.   . metFORMIN (GLUCOPHAGE) 1000 MG  tablet Take 1,000 mg by mouth 2 (two) times daily.   . nitroGLYCERIN (NITROSTAT) 0.4 MG SL tablet PLACE 1 TABLET (0.4 MG TOTAL) UNDER THE TONGUE EVERY 5  MINUTES AS NEEDED FOR CHEST PAIN. (Patient taking differently: Place 0.4 mg under the tongue every 5 (five) minutes as needed. )   . pantoprazole (PROTONIX) 40 MG tablet Take 1 tablet (40 mg total) by mouth 2 (two) times daily. To protect stomach while taking multiple blood thinners.   . potassium chloride SA (K-DUR) 20 MEQ tablet Take 1 tablet (20 mEq total) by mouth daily.   . psyllium (METAMUCIL) 58.6 % packet Take 1 packet by mouth daily.   . traZODone (DESYREL) 100 MG tablet Take 100-200 mg by mouth at bedtime.     No facility-administered encounter medications on file as of 02/14/2019.     Functional Status:  In your present state of health, do you have any difficulty performing the following activities: 12/11/2018 11/26/2018  Hearing? Y N  Vision? Y N  Difficulty concentrating or making decisions? N N  Walking or climbing stairs? Y N  Comment using a walker -  Dressing or bathing? N N  Doing errands, shopping? Y N  Comment wife does the driving -  Conservation officer, nature and eating ? N -  Using the Toilet? N -  In the past six months, have  you accidently leaked urine? Y -  Comment wears depends -  Do you have problems with loss of bowel control? N -  Managing your Medications? N -  Managing your Finances? N -  Housekeeping or managing your Housekeeping? Y -  Comment wife does -  Some recent data might be hidden    Fall/Depression Screening: Fall Risk  12/11/2018  Falls in the past year? 1  Number falls in past yr: 1  Injury with Fall? 0  Risk for fall due to : History of fall(s);Impaired balance/gait  Follow up Falls prevention discussed   PHQ 2/9 Scores 12/20/2018  PHQ - 2 Score 0    Assessment: Patient continues to manage chronic illnesses.    Plan:  [facility] CM Care Plan Problem One     Most Recent Value  Care Plan Problem  One  High risk for hospital readmission related to as evidenced by recent hospitalization for Acute on Chronic heart failure, atrial fib, heme positive stools   Role Documenting the Problem One  Care Management [place] for Problem One  Active  [facility] Long Term Goal   Patient will not experience a hospital readmission over the next 60 days.    [facility] Long Term Goal Start Date  11/30/18  [facility] Long Term Goal Met Date  02/14/19  Interventions for Problem One Long Term Goal  met  TH[facility] CM Short Term Goal #1   Over the next 31 days, patient will demonstrate and/or verbalize understanding of self-health management for long term care of CHF  [facility] CM Short Term Goal #1 Start Date  02/14/19  Interventions for Short Term Goal #1  Patient weighs daily, takes medications, and monitors for signs of heart failure such as swelling and shortness of breath.  RN CM Discussed weight parameters and when to notify physician.     [facility] CM Short Term Goal #3  Over the next 30 days patient will be able to identify at least 3 symptoms in yellow zone of heart failure   [facility] CM Short Term Goal #3 Start Date  12/11/18  [facility] CM Short Term Goal #3 Met Date  01/17/19     RN CM will contact patient again in the month of December and patient agreeable.    [name], RN, MSN [place] Management Care Management Coordinator Direct Line [phone] Cell [phone] Toll Free: [phone]  Fax: [phone]

## 2019-02-15 ENCOUNTER — Other Ambulatory Visit (INDEPENDENT_AMBULATORY_CARE_PROVIDER_SITE_OTHER): Payer: Self-pay | Admitting: *Deleted

## 2019-02-15 DIAGNOSIS — K922 Gastrointestinal hemorrhage, unspecified: Secondary | ICD-10-CM

## 2019-02-18 ENCOUNTER — Other Ambulatory Visit (INDEPENDENT_AMBULATORY_CARE_PROVIDER_SITE_OTHER): Payer: Self-pay | Admitting: *Deleted

## 2019-02-18 DIAGNOSIS — K922 Gastrointestinal hemorrhage, unspecified: Secondary | ICD-10-CM

## 2019-02-22 ENCOUNTER — Other Ambulatory Visit: Payer: Self-pay | Admitting: Cardiology

## 2019-02-22 DIAGNOSIS — Z299 Encounter for prophylactic measures, unspecified: Secondary | ICD-10-CM | POA: Diagnosis not present

## 2019-02-22 DIAGNOSIS — I10 Essential (primary) hypertension: Secondary | ICD-10-CM | POA: Diagnosis not present

## 2019-02-22 DIAGNOSIS — I509 Heart failure, unspecified: Secondary | ICD-10-CM | POA: Diagnosis not present

## 2019-02-22 DIAGNOSIS — Z6834 Body mass index (BMI) 34.0-34.9, adult: Secondary | ICD-10-CM | POA: Diagnosis not present

## 2019-02-22 DIAGNOSIS — E11621 Type 2 diabetes mellitus with foot ulcer: Secondary | ICD-10-CM | POA: Diagnosis not present

## 2019-02-22 DIAGNOSIS — L97509 Non-pressure chronic ulcer of other part of unspecified foot with unspecified severity: Secondary | ICD-10-CM | POA: Diagnosis not present

## 2019-02-22 DIAGNOSIS — E1140 Type 2 diabetes mellitus with diabetic neuropathy, unspecified: Secondary | ICD-10-CM | POA: Diagnosis not present

## 2019-02-22 DIAGNOSIS — E1165 Type 2 diabetes mellitus with hyperglycemia: Secondary | ICD-10-CM | POA: Diagnosis not present

## 2019-02-28 ENCOUNTER — Other Ambulatory Visit: Payer: Self-pay

## 2019-02-28 ENCOUNTER — Emergency Department (HOSPITAL_COMMUNITY): Payer: Medicare HMO

## 2019-02-28 ENCOUNTER — Inpatient Hospital Stay (HOSPITAL_COMMUNITY)
Admission: EM | Admit: 2019-02-28 | Discharge: 2019-03-03 | DRG: 239 | Disposition: A | Discharge status: Home Health | Payer: Medicare HMO | Attending: Internal Medicine | Admitting: Internal Medicine

## 2019-02-28 ENCOUNTER — Encounter (HOSPITAL_COMMUNITY): Payer: Self-pay | Admitting: Emergency Medicine

## 2019-02-28 DIAGNOSIS — Z66 Do not resuscitate: Secondary | ICD-10-CM | POA: Diagnosis present

## 2019-02-28 DIAGNOSIS — Z981 Arthrodesis status: Secondary | ICD-10-CM

## 2019-02-28 DIAGNOSIS — Z955 Presence of coronary angioplasty implant and graft: Secondary | ICD-10-CM

## 2019-02-28 DIAGNOSIS — I4892 Unspecified atrial flutter: Secondary | ICD-10-CM | POA: Diagnosis not present

## 2019-02-28 DIAGNOSIS — E118 Type 2 diabetes mellitus with unspecified complications: Secondary | ICD-10-CM | POA: Diagnosis not present

## 2019-02-28 DIAGNOSIS — I110 Hypertensive heart disease with heart failure: Secondary | ICD-10-CM | POA: Diagnosis not present

## 2019-02-28 DIAGNOSIS — I10 Essential (primary) hypertension: Secondary | ICD-10-CM | POA: Diagnosis not present

## 2019-02-28 DIAGNOSIS — E782 Mixed hyperlipidemia: Secondary | ICD-10-CM | POA: Diagnosis present

## 2019-02-28 DIAGNOSIS — G4700 Insomnia, unspecified: Secondary | ICD-10-CM | POA: Diagnosis present

## 2019-02-28 DIAGNOSIS — E119 Type 2 diabetes mellitus without complications: Secondary | ICD-10-CM

## 2019-02-28 DIAGNOSIS — Z03818 Encounter for observation for suspected exposure to other biological agents ruled out: Secondary | ICD-10-CM | POA: Diagnosis not present

## 2019-02-28 DIAGNOSIS — Z7901 Long term (current) use of anticoagulants: Secondary | ICD-10-CM | POA: Diagnosis not present

## 2019-02-28 DIAGNOSIS — Z888 Allergy status to other drugs, medicaments and biological substances status: Secondary | ICD-10-CM

## 2019-02-28 DIAGNOSIS — Z825 Family history of asthma and other chronic lower respiratory diseases: Secondary | ICD-10-CM

## 2019-02-28 DIAGNOSIS — E11621 Type 2 diabetes mellitus with foot ulcer: Secondary | ICD-10-CM | POA: Diagnosis not present

## 2019-02-28 DIAGNOSIS — Z89411 Acquired absence of right great toe: Secondary | ICD-10-CM | POA: Diagnosis not present

## 2019-02-28 DIAGNOSIS — D638 Anemia in other chronic diseases classified elsewhere: Secondary | ICD-10-CM | POA: Diagnosis present

## 2019-02-28 DIAGNOSIS — N289 Disorder of kidney and ureter, unspecified: Secondary | ICD-10-CM | POA: Diagnosis present

## 2019-02-28 DIAGNOSIS — E11628 Type 2 diabetes mellitus with other skin complications: Secondary | ICD-10-CM | POA: Diagnosis not present

## 2019-02-28 DIAGNOSIS — L97518 Non-pressure chronic ulcer of other part of right foot with other specified severity: Secondary | ICD-10-CM | POA: Diagnosis not present

## 2019-02-28 DIAGNOSIS — I2510 Atherosclerotic heart disease of native coronary artery without angina pectoris: Secondary | ICD-10-CM | POA: Diagnosis present

## 2019-02-28 DIAGNOSIS — I5033 Acute on chronic diastolic (congestive) heart failure: Secondary | ICD-10-CM | POA: Diagnosis not present

## 2019-02-28 DIAGNOSIS — I5032 Chronic diastolic (congestive) heart failure: Secondary | ICD-10-CM | POA: Diagnosis not present

## 2019-02-28 DIAGNOSIS — Z96653 Presence of artificial knee joint, bilateral: Secondary | ICD-10-CM | POA: Diagnosis present

## 2019-02-28 DIAGNOSIS — Z8249 Family history of ischemic heart disease and other diseases of the circulatory system: Secondary | ICD-10-CM

## 2019-02-28 DIAGNOSIS — Z87891 Personal history of nicotine dependence: Secondary | ICD-10-CM

## 2019-02-28 DIAGNOSIS — I739 Peripheral vascular disease, unspecified: Secondary | ICD-10-CM | POA: Diagnosis present

## 2019-02-28 DIAGNOSIS — G4730 Sleep apnea, unspecified: Secondary | ICD-10-CM | POA: Diagnosis present

## 2019-02-28 DIAGNOSIS — Z794 Long term (current) use of insulin: Secondary | ICD-10-CM | POA: Diagnosis not present

## 2019-02-28 DIAGNOSIS — Z89412 Acquired absence of left great toe: Secondary | ICD-10-CM | POA: Diagnosis not present

## 2019-02-28 DIAGNOSIS — E039 Hypothyroidism, unspecified: Secondary | ICD-10-CM | POA: Diagnosis present

## 2019-02-28 DIAGNOSIS — I70263 Atherosclerosis of native arteries of extremities with gangrene, bilateral legs: Secondary | ICD-10-CM | POA: Diagnosis not present

## 2019-02-28 DIAGNOSIS — Z833 Family history of diabetes mellitus: Secondary | ICD-10-CM

## 2019-02-28 DIAGNOSIS — Z79899 Other long term (current) drug therapy: Secondary | ICD-10-CM

## 2019-02-28 DIAGNOSIS — I96 Gangrene, not elsewhere classified: Secondary | ICD-10-CM | POA: Diagnosis not present

## 2019-02-28 DIAGNOSIS — I4891 Unspecified atrial fibrillation: Secondary | ICD-10-CM | POA: Diagnosis not present

## 2019-02-28 DIAGNOSIS — Z823 Family history of stroke: Secondary | ICD-10-CM

## 2019-02-28 DIAGNOSIS — J449 Chronic obstructive pulmonary disease, unspecified: Secondary | ICD-10-CM | POA: Diagnosis present

## 2019-02-28 DIAGNOSIS — Z820 Family history of epilepsy and other diseases of the nervous system: Secondary | ICD-10-CM

## 2019-02-28 DIAGNOSIS — L089 Local infection of the skin and subcutaneous tissue, unspecified: Secondary | ICD-10-CM | POA: Diagnosis not present

## 2019-02-28 DIAGNOSIS — Z885 Allergy status to narcotic agent status: Secondary | ICD-10-CM | POA: Diagnosis not present

## 2019-02-28 DIAGNOSIS — J431 Panlobular emphysema: Secondary | ICD-10-CM | POA: Diagnosis not present

## 2019-02-28 DIAGNOSIS — Z20828 Contact with and (suspected) exposure to other viral communicable diseases: Secondary | ICD-10-CM | POA: Diagnosis present

## 2019-02-28 DIAGNOSIS — K219 Gastro-esophageal reflux disease without esophagitis: Secondary | ICD-10-CM | POA: Diagnosis present

## 2019-02-28 DIAGNOSIS — I480 Paroxysmal atrial fibrillation: Secondary | ICD-10-CM | POA: Diagnosis present

## 2019-02-28 DIAGNOSIS — L0889 Other specified local infections of the skin and subcutaneous tissue: Secondary | ICD-10-CM | POA: Diagnosis not present

## 2019-02-28 DIAGNOSIS — E1152 Type 2 diabetes mellitus with diabetic peripheral angiopathy with gangrene: Principal | ICD-10-CM | POA: Diagnosis present

## 2019-02-28 DIAGNOSIS — E1165 Type 2 diabetes mellitus with hyperglycemia: Secondary | ICD-10-CM | POA: Diagnosis not present

## 2019-02-28 DIAGNOSIS — I70261 Atherosclerosis of native arteries of extremities with gangrene, right leg: Secondary | ICD-10-CM | POA: Diagnosis not present

## 2019-02-28 DIAGNOSIS — J9811 Atelectasis: Secondary | ICD-10-CM | POA: Diagnosis not present

## 2019-02-28 LAB — BASIC METABOLIC PANEL
Anion gap: 12 (ref 5–15)
BUN: 19 mg/dL (ref 8–23)
CO2: 25 mmol/L (ref 22–32)
Calcium: 9 mg/dL (ref 8.9–10.3)
Chloride: 101 mmol/L (ref 98–111)
Creatinine, Ser: 1.22 mg/dL (ref 0.61–1.24)
GFR calc Af Amer: >60 mL/min (ref >60)
GFR calc non Af Amer: 54 mL/min — ABNORMAL LOW (ref >60)
Glucose, Bld: 103 mg/dL — ABNORMAL HIGH (ref 70–99)
Potassium: 3.8 mmol/L (ref 3.5–5.1)
Sodium: 138 mmol/L (ref 135–145)

## 2019-02-28 LAB — CBC WITH DIFFERENTIAL/PLATELET
Abs Immature Granulocytes: 0.09 10*3/uL — ABNORMAL HIGH (ref 0.00–0.07)
Basophils Absolute: 0 10*3/uL (ref 0.0–0.1)
Basophils Relative: 0 %
Eosinophils Absolute: 0.1 10*3/uL (ref 0.0–0.5)
Eosinophils Relative: 1 %
HCT: 28.9 % — ABNORMAL LOW (ref 39.0–52.0)
Hemoglobin: 9.1 g/dL — ABNORMAL LOW (ref 13.0–17.0)
Immature Granulocytes: 1 %
Lymphocytes Relative: 13 %
Lymphs Abs: 1 10*3/uL (ref 0.7–4.0)
MCH: 25.3 pg — ABNORMAL LOW (ref 26.0–34.0)
MCHC: 31.5 g/dL (ref 30.0–36.0)
MCV: 80.3 fL (ref 80.0–100.0)
Monocytes Absolute: 1.6 10*3/uL — ABNORMAL HIGH (ref 0.1–1.0)
Monocytes Relative: 20 %
Neutro Abs: 5.3 10*3/uL (ref 1.7–7.7)
Neutrophils Relative %: 65 %
Platelets: 225 10*3/uL (ref 150–400)
RBC: 3.6 MIL/uL — ABNORMAL LOW (ref 4.22–5.81)
RDW: 18.7 % — ABNORMAL HIGH (ref 11.5–15.5)
WBC: 8.2 10*3/uL (ref 4.0–10.5)
nRBC: 0 % (ref 0.0–0.2)

## 2019-02-28 LAB — GLUCOSE, CAPILLARY: Glucose-Capillary: 88 mg/dL (ref 70–99)

## 2019-02-28 MED ORDER — PANTOPRAZOLE SODIUM 40 MG PO TBEC
40.0000 mg | DELAYED_RELEASE_TABLET | Freq: Every day | ORAL | Status: DC
  Administered 2019-03-02 – 2019-03-03 (×2): 40 mg via ORAL
  Filled 2019-02-28 (×3): qty 1

## 2019-02-28 MED ORDER — SODIUM CHLORIDE 0.9 % IV SOLN
INTRAVENOUS | Status: DC
  Administered 2019-02-28 – 2019-03-01 (×2): via INTRAVENOUS

## 2019-02-28 MED ORDER — MECLIZINE HCL 12.5 MG PO TABS
12.5000 mg | ORAL_TABLET | Freq: Three times a day (TID) | ORAL | Status: DC | PRN
  Filled 2019-02-28: qty 1

## 2019-02-28 MED ORDER — INSULIN GLARGINE 100 UNIT/ML ~~LOC~~ SOLN: 30.0000 [IU] | SUBCUTANEOUS | Status: DC

## 2019-02-28 MED ORDER — ACETAMINOPHEN 650 MG RE SUPP: 650.0000 mg | Freq: Four times a day (QID) | RECTAL | Status: DC | PRN

## 2019-02-28 MED ORDER — FUROSEMIDE 40 MG PO TABS: 40.0000 mg | ORAL_TABLET | Freq: Two times a day (BID) | ORAL | Status: DC

## 2019-02-28 MED ORDER — PSYLLIUM 95 % PO PACK
1.0000 | PACK | Freq: Two times a day (BID) | ORAL | Status: DC
  Administered 2019-03-01 – 2019-03-03 (×3): 1 via ORAL
  Filled 2019-02-28 (×6): qty 1

## 2019-02-28 MED ORDER — ONDANSETRON HCL 4 MG PO TABS: 4.0000 mg | ORAL_TABLET | Freq: Four times a day (QID) | ORAL | Status: DC | PRN

## 2019-02-28 MED ORDER — NITROGLYCERIN 0.4 MG SL SUBL: 0.4000 mg | SUBLINGUAL_TABLET | SUBLINGUAL | Status: DC | PRN

## 2019-02-28 MED ORDER — ACETAMINOPHEN 500 MG PO TABS
500.0000 mg | ORAL_TABLET | Freq: Four times a day (QID) | ORAL | Status: DC | PRN
  Administered 2019-03-03: 500 mg via ORAL
  Filled 2019-02-28: qty 1

## 2019-02-28 MED ORDER — METFORMIN HCL 500 MG PO TABS: 1000.0000 mg | ORAL_TABLET | Freq: Two times a day (BID) | ORAL | Status: DC

## 2019-02-28 MED ORDER — TRAZODONE HCL 50 MG PO TABS
100.0000 mg | ORAL_TABLET | Freq: Every day | ORAL | Status: DC
  Administered 2019-03-01 – 2019-03-02 (×3): 100 mg via ORAL
  Filled 2019-02-28 (×3): qty 2

## 2019-02-28 MED ORDER — LISINOPRIL 5 MG PO TABS: 2.5000 mg | ORAL_TABLET | Freq: Every day | ORAL | Status: DC

## 2019-02-28 MED ORDER — INSULIN ASPART 100 UNIT/ML ~~LOC~~ SOLN
0.0000 [IU] | Freq: Every day | SUBCUTANEOUS | Status: DC
  Administered 2019-03-01: 2 [IU] via SUBCUTANEOUS

## 2019-02-28 MED ORDER — INSULIN ASPART 100 UNIT/ML ~~LOC~~ SOLN
0.0000 [IU] | Freq: Three times a day (TID) | SUBCUTANEOUS | Status: DC
  Administered 2019-03-01: 5 [IU] via SUBCUTANEOUS
  Administered 2019-03-02: 1 [IU] via SUBCUTANEOUS
  Administered 2019-03-02 – 2019-03-03 (×3): 2 [IU] via SUBCUTANEOUS

## 2019-02-28 MED ORDER — PIPERACILLIN-TAZOBACTAM 3.375 G IVPB 30 MIN
3.3750 g | Freq: Once | INTRAVENOUS | Status: AC
  Administered 2019-02-28: 3.375 g via INTRAVENOUS
  Filled 2019-02-28: qty 50

## 2019-02-28 MED ORDER — FOLIC ACID 1 MG PO TABS
1.0000 mg | ORAL_TABLET | Freq: Every day | ORAL | Status: DC
  Administered 2019-03-02 – 2019-03-03 (×2): 1 mg via ORAL
  Filled 2019-02-28 (×3): qty 1

## 2019-02-28 MED ORDER — POTASSIUM CHLORIDE CRYS ER 20 MEQ PO TBCR
20.0000 meq | EXTENDED_RELEASE_TABLET | Freq: Every day | ORAL | Status: DC
  Administered 2019-03-01 – 2019-03-03 (×3): 20 meq via ORAL
  Filled 2019-02-28 (×4): qty 1

## 2019-02-28 MED ORDER — VITAMIN B-12 1000 MCG PO TABS
1000.0000 ug | ORAL_TABLET | Freq: Every day | ORAL | Status: DC
  Administered 2019-03-02 – 2019-03-03 (×2): 1000 ug via ORAL
  Filled 2019-02-28 (×3): qty 1

## 2019-02-28 MED ORDER — INSULIN GLARGINE 100 UNIT/ML ~~LOC~~ SOLN
40.0000 [IU] | Freq: Every day | SUBCUTANEOUS | Status: DC
  Administered 2019-03-02 – 2019-03-03 (×2): 40 [IU] via SUBCUTANEOUS
  Filled 2019-02-28 (×4): qty 0.4

## 2019-02-28 MED ORDER — ENOXAPARIN SODIUM 40 MG/0.4ML ~~LOC~~ SOLN: 40.0000 mg | SUBCUTANEOUS | Status: DC

## 2019-02-28 MED ORDER — ISOSORBIDE MONONITRATE ER 30 MG PO TB24
15.0000 mg | ORAL_TABLET | Freq: Every day | ORAL | Status: DC
  Administered 2019-03-02 – 2019-03-03 (×2): 15 mg via ORAL
  Filled 2019-02-28 (×3): qty 1

## 2019-02-28 MED ORDER — INSULIN GLARGINE 100 UNIT/ML ~~LOC~~ SOLN
30.0000 [IU] | Freq: Every day | SUBCUTANEOUS | Status: DC
  Administered 2019-03-01 – 2019-03-02 (×3): 30 [IU] via SUBCUTANEOUS
  Filled 2019-02-28 (×4): qty 0.3

## 2019-02-28 MED ORDER — VITAMIN D 25 MCG (1000 UNIT) PO TABS
1000.0000 [IU] | ORAL_TABLET | Freq: Every day | ORAL | Status: DC
  Administered 2019-03-02 – 2019-03-03 (×2): 1000 [IU] via ORAL
  Filled 2019-02-28 (×3): qty 1

## 2019-02-28 MED ORDER — ONDANSETRON HCL 4 MG/2ML IJ SOLN: 4.0000 mg | Freq: Four times a day (QID) | INTRAMUSCULAR | Status: DC | PRN

## 2019-02-28 MED ORDER — ACETAMINOPHEN 325 MG PO TABS
650.0000 mg | ORAL_TABLET | Freq: Four times a day (QID) | ORAL | Status: DC | PRN
  Administered 2019-03-01 – 2019-03-02 (×2): 650 mg via ORAL
  Filled 2019-02-28 (×2): qty 2

## 2019-02-28 MED ORDER — ADULT MULTIVITAMIN W/MINERALS CH
1.0000 | ORAL_TABLET | Freq: Every day | ORAL | Status: DC
  Administered 2019-03-02 – 2019-03-03 (×2): 1 via ORAL
  Filled 2019-02-28 (×3): qty 1

## 2019-02-28 NOTE — H&P (Signed)
History and Physical   Mike Wright [id number] DOB: April 30, 1933 DOA: 02/28/2019  Referring MD/NP/PA: Dr. Francia Greaves  PCP: Monico Blitz, MD   Outpatient Specialists: [name], vascular surgery  Patient coming from: Home  Chief Complaint: Right second toe infection  HPI: Mike Wright is a 85 y.o. male with medical history significant of peripheral vascular disease with multiple previous irritations of the toes especially the 2 great toes, diabetes, hypertension, coronary artery disease, hyperlipidemia, anemia of chronic disease, diastolic dysfunction CHF GERD who presents to the ER with right second toe infection which has gradually worsened over the last 2 weeks.  It started as usual diabetic foot infection he was seen by his PCP and sent back to vascular surgery already done previous amputation.  The patient has not despite trial of some antibiotics.  Denies any fever or chills denied any nausea but no diarrhea.  Patient has noted gradual worsening.  No injury.  He is being admitted with infection and gangrene of the right second toe.  ED Course: Vitals are stable, chemistry appears to be stable and within normal result.  CBC also looks good except for hemoglobin 9.1.  Chest x-ray showed no acute finding x-ray of the right foot showed gas and MD second metatarsal possibly indicating infection.   Review of Systems: As per HPI otherwise 10 point review of systems negative.    Past Medical History:  Diagnosis Date  . Anemia    a. mild, noted 04/2017.  [name] CAD in native artery    a. [name] 04/2017 s/p DES to D1, DES to prox-mid LAD, DES to prox LAD overlapping the prior stent, LVEF 55-65%.   . Chronic diastolic CHF (congestive heart failure) ([place])   . Diabetic ulcer of toe ([place])   . DJD (degenerative joint disease) of cervical spine   . Essential hypertension   . GERD (gastroesophageal reflux disease)   . History of hiatal hernia   . HIT (heparin-induced thrombocytopenia) ([place])   .  Hypothyroidism   . Hypoxia    a. went home on home O2 04/2017.  [name] Insomnia   . Mixed hyperlipidemia   . PAD (peripheral artery disease) ([place])   . PAF (paroxysmal atrial fibrillation) ([place])   . PVD (peripheral vascular disease) ([place])   . Renal insufficiency   . 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  . Sinus bradycardia    a. HR 30s-40s in 04/2017 -> diltiazem stopped, metoprolol reduced.  . Sleep apnea    "chose not to order CPAP at this time" (05/18/2017)  . Type 2 diabetes mellitus ([place])   . Wheezing    a. suspected COPD 04/2017. Former tobacco x 40 years.    Past Surgical History:  Procedure Laterality Date  . ABDOMINAL AORTOGRAM W/LOWER EXTREMITY N/A 09/15/2017   Procedure: ABDOMINAL AORTOGRAM W/LOWER EXTREMITY;  Surgeon: Serafina Mitchell, MD;  Location: [place] [facility];  Service: Cardiovascular;  Laterality: N/A;  . AMPUTATION Bilateral 09/20/2017   Procedure: BILATERAL GREAT TOE AMPUTATIONS INCLUDING METATARSAL HEADS;  Surgeon: Serafina Mitchell, MD;  [facility];  Service: Vascular;  Laterality: Bilateral;  . ANGIOPLASTY Right 09/20/2017   Procedure: ANGIOPLASTY RIGHT TIBIAL ARTERY;  Surgeon: Serafina Mitchell, MD;  Location: [place];  Service: Vascular;  Laterality: Right;  . APPENDECTOMY    . BACK SURGERY    . CARDIAC CATHETERIZATION  1980s; 2012;  . CATARACT EXTRACTION Left 2004  . COLONOSCOPY  2004   FLEISHMAN TICS  . COLONOSCOPY  WITH PROPOFOL N/A 11/28/2018   Procedure: COLONOSCOPY WITH PROPOFOL;  Surgeon: Rogene Houston, MD;  [facility];  Service: Endoscopy;  Laterality: N/A;  . CORONARY ANGIOPLASTY WITH STENT PLACEMENT  05/18/2017   "3 stents"  . CORONARY STENT INTERVENTION N/A 05/18/2017   Procedure: CORONARY STENT INTERVENTION;  Surgeon: Jettie Booze, MD;  Location: [place] [facility];  Service: Cardiovascular;  Laterality: N/A;  . ESOPHAGOGASTRODUODENOSCOPY (EGD) WITH PROPOFOL N/A 11/20/2017   Procedure: ESOPHAGOGASTRODUODENOSCOPY (EGD) WITH  PROPOFOL;  Surgeon: Irene Shipper, MD;  Location: [facility];  Service: Gastroenterology;  Laterality: N/A;  . ESOPHAGOGASTRODUODENOSCOPY (EGD) WITH PROPOFOL N/A 11/28/2018   Procedure: ESOPHAGOGASTRODUODENOSCOPY (EGD) WITH PROPOFOL;  Surgeon: Rogene Houston, MD;  [facility];  Service: Endoscopy;  Laterality: N/A;  . GIVENS CAPSULE STUDY N/A 12/17/2018   Procedure: GIVENS CAPSULE STUDY;  Surgeon: Rogene Houston, MD;  [facility];  Service: Endoscopy;  Laterality: N/A;  7:30am  . I&D EXTREMITY Right 12/17/2017   Procedure: IRRIGATION AND DEBRIDEMENT RIGHT GREAT TOE;  Surgeon: Serafina Mitchell, MD;  [facility];  Service: Vascular;  Laterality: Right;  . JOINT REPLACEMENT    . LEFT HEART CATH AND CORONARY ANGIOGRAPHY N/A 05/18/2017   Procedure: LEFT HEART CATH AND CORONARY ANGIOGRAPHY;  Surgeon: Jettie Booze, MD;  Location: [place] [facility];  Service: Cardiovascular;  Laterality: N/A;  . LOWER EXTREMITY ANGIOGRAM Right 09/20/2017   Procedure: RIGHT LOWER LEG  ANGIOGRAM;  Surgeon: Serafina Mitchell, MD;  Location: [place];  Service: Vascular;  Laterality: Right;  . LUMBAR FUSION  2002   L3, 4 L4, 5 L5 S1 Fused by Dr. Glenna Fellows  . PERIPHERAL VASCULAR BALLOON ANGIOPLASTY Left 09/15/2017   Procedure: PERIPHERAL VASCULAR BALLOON ANGIOPLASTY;  Surgeon: Serafina Mitchell, MD;  Location: [place] [facility];  Service: Cardiovascular;  Laterality: Left;  PTA of Peroneal & Posterior Tibial  . POLYPECTOMY  11/28/2018   Procedure: POLYPECTOMY;  Surgeon: Rogene Houston, MD;  [facility];  Service: Endoscopy;;  duodenum  . POSTERIOR LUMBAR FUSION    . RHINOPLASTY    . RIGHT HEART CATH N/A 05/18/2017   Procedure: RIGHT HEART CATH;  Surgeon: Jettie Booze, MD;  Location: [place] [facility];  Service: Cardiovascular;  Laterality: N/A;  . TEE WITHOUT CARDIOVERSION N/A 09/18/2017   Procedure: TRANSESOPHAGEAL ECHOCARDIOGRAM (TEE);  Surgeon: Fay Records, MD;   Location: [facility];  Service: Cardiovascular;  Laterality: N/A;  . TOTAL KNEE ARTHROPLASTY Bilateral   . TRANSURETHRAL RESECTION OF PROSTATE  2001   [place]     reports that he quit smoking about 35 years ago. His smoking use included cigarettes. He started smoking about 65 years ago. He has a 60.00 pack-year smoking history. He has never used smokeless tobacco. He reports previous alcohol use. He reports that he does not use drugs.  Allergies  Allergen Reactions  . Bactrim [Sulfamethoxazole-Trimethoprim]     Pancytopenia  . Codeine Shortness Of Breath  . Heparin Other (See Comments)    +HIT,  Severe bleeding (with heparin drip & large doses)  . Losartan Swelling    Per ENT UNSPECIFIED REACTION   . Other Other (See Comments)    Severe bleeding UNSPECIFIED AGENT   . Oxycodone Other (See Comments)    "Made me act out of my mind" Other reaction(s): Other (See Comments) Mental status changes hallucinations    Family History  Problem Relation Age of Onset  .  Heart attack Father 63  . COPD Father   . COPD Mother   . Heart disease Mother   . Diabetes Mother   . Hypertension Sister   . CVA Sister   . Diabetes Sister   . Multiple sclerosis Sister      Prior to Admission medications   Medication Sig Start Date End Date Taking? Authorizing Provider  acetaminophen (TYLENOL) 500 MG tablet Take 500 mg by mouth as needed for mild pain or headache.    [provider]  atorvastatin (LIPITOR) 80 MG tablet Take 1 tablet (80 mg total) by mouth every evening. 05/19/17   Dunn, Nedra Hai, PA-C  Cholecalciferol (D3-1000 PO) Take 1,000 Units by mouth daily.     [provider]  Cyanocobalamin (B-12 PO) Take 1 tablet by mouth daily.     [provider]  ELIQUIS 5 MG TABS tablet TAKE 1 TABLET TWICE DAILY 02/25/19   Arnoldo Lenis, MD  folic acid (FOLVITE) 1 MG tablet Take 1 tablet (1 mg total) by mouth daily. 12/06/17   Oswald Hillock, MD  furosemide (LASIX) 40 MG  tablet TAKE 1 TABLET TWICE DAILY  (DOSE  CHANGE) 11/30/18   Arnoldo Lenis, MD  insulin glargine (LANTUS) 100 UNIT/ML injection Inject 30-40 Units into the skin See admin instructions. Inject 40 units SQ in the morning and inject 30 units SQ at bedtime    [provider]  isosorbide mononitrate (IMDUR) 30 MG 24 hr tablet Take 0.5 tablets (15 mg total) by mouth daily. 04/04/18 01/25/20  Arnoldo Lenis, MD  lisinopril (ZESTRIL) 5 MG tablet Take 0.5 tablets (2.5 mg total) by mouth daily. 11/29/18   Barton Dubois, MD  meclizine (ANTIVERT) 12.5 MG tablet Take 1 tablet (12.5 mg total) by mouth 3 (three) times daily as needed for dizziness. 05/30/17   Richardson Dopp T, PA-C  metFORMIN (GLUCOPHAGE) 1000 MG tablet Take 1,000 mg by mouth 2 (two) times daily. 05/22/17   [provider]  nitroGLYCERIN (NITROSTAT) 0.4 MG SL tablet PLACE 1 TABLET (0.4 MG TOTAL) UNDER THE TONGUE EVERY 5  MINUTES AS NEEDED FOR CHEST PAIN. Patient taking differently: Place 0.4 mg under the tongue every 5 (five) minutes as needed.  05/22/17   Arnoldo Lenis, MD  pantoprazole (PROTONIX) 40 MG tablet Take 1 tablet (40 mg total) by mouth 2 (two) times daily. To protect stomach while taking multiple blood thinners. 11/29/18   Barton Dubois, MD  Pediatric Multivitamins-Iron [facility] COMPLETE) 18 MG CHEW Chew 1 tablet by mouth 2 (two) times daily. 02/14/19   Rehman, Mechele Dawley, MD  potassium chloride SA (K-DUR) 20 MEQ tablet Take 1 tablet (20 mEq total) by mouth daily. 11/30/18   Barton Dubois, MD  psyllium (METAMUCIL) 58.6 % packet Take 1 packet by mouth daily.    [provider]  traZODone (DESYREL) 100 MG tablet Take 100-200 mg by mouth at bedtime.  03/25/16   [provider]    Physical Exam: Vitals:   02/28/19 1602 02/28/19 1610 02/28/19 1808  BP: 134/84  130/72  Pulse: 84  71  Resp: 16  18  Temp: 98.2 F (36.8 C)    TempSrc: Oral    SpO2: 100%  100%  Weight:  84.4 kg   Height:  5\' 7"   (1.702 m)       Constitutional: NAD, calm, comfortable Vitals:   02/28/19 1602 02/28/19 1610 02/28/19 1808  BP: 134/84  130/72  Pulse: 84  71  Resp: 16  18  Temp: 98.2 F (36.8 C)    TempSrc: Oral    SpO2: 100%  100%  Weight:  84.4 kg   Height:  5\' 7"  (1.702 m)    Eyes: PERRL, lids and conjunctivae normal ENMT: Mucous membranes are moist. Posterior pharynx clear of any exudate or lesions.Normal dentition.  Neck: normal, supple, no masses, no thyromegaly Respiratory: clear to auscultation bilaterally, no wheezing, no crackles. Normal respiratory effort. No accessory muscle use.  Cardiovascular: Regular rate and rhythm, no murmurs / rubs / gallops. No extremity edema. 2+ pedal pulses. No carotid bruits.  Abdomen: no tenderness, no masses palpated. No hepatosplenomegaly. Bowel sounds positive.  Musculoskeletal: no clubbing / cyanosis. No joint deformity upper and lower extremities. Good ROM, no contractures. Normal muscle tone.  Right second toe gangrene noticed almost fallen off part foul-smelling.  Bilateral big toes amputated Skin: no rashes, lesions, ulcers. No induration Neurologic: CN 2-12 grossly intact. Sensation intact, DTR normal. Strength 5/5 in all 4.  Psychiatric: Normal judgment and insight. Alert and oriented x 3. Normal mood.     Labs on Admission: I have personally reviewed following labs and imaging studies  CBC: Recent Labs  Lab 02/28/19 1807  WBC 8.2  NEUTROABS 5.3  HGB 9.1*  HCT 28.9*  MCV 80.3  PLT 123456   Basic Metabolic Panel: Recent Labs  Lab 02/28/19 1807  NA 138  K 3.8  CL 101  CO2 25  GLUCOSE 103*  BUN 19  CREATININE 1.22  CALCIUM 9.0   GFR: Estimated Creatinine Clearance: 47.6 mL/min (by C-G formula based on SCr of 1.22 mg/dL). Liver Function Tests: No results for input(s): AST, ALT, ALKPHOS, BILITOT, PROT, ALBUMIN in the last 168 hours. No results for input(s): LIPASE, AMYLASE in the last 168 hours. No results for input(s):  AMMONIA in the last 168 hours. Coagulation Profile: No results for input(s): INR, PROTIME in the last 168 hours. Cardiac Enzymes: No results for input(s): CKTOTAL, CKMB, CKMBINDEX, TROPONINI in the last 168 hours. BNP (last 3 results) No results for input(s): PROBNP in the last 8760 hours. HbA1C: No results for input(s): HGBA1C in the last 72 hours. CBG: No results for input(s): GLUCAP in the last 168 hours. Lipid Profile: No results for input(s): CHOL, HDL, LDLCALC, TRIG, CHOLHDL, LDLDIRECT in the last 72 hours. Thyroid Function Tests: No results for input(s): TSH, T4TOTAL, FREET4, T3FREE, THYROIDAB in the last 72 hours. Anemia Panel: No results for input(s): VITAMINB12, FOLATE, FERRITIN, TIBC, IRON, RETICCTPCT in the last 72 hours. Urine analysis:    Component Value Date/Time   COLORURINE YELLOW 12/13/2017 2043   APPEARANCEUR CLEAR 12/13/2017 2043   LABSPEC 1.014 12/13/2017 2043   PHURINE 5.0 12/13/2017 2043   GLUCOSEU NEGATIVE 12/13/2017 2043   HGBUR NEGATIVE 12/13/2017 2043   BILIRUBINUR NEGATIVE 12/13/2017 2043   [place] NEGATIVE 12/13/2017 2043   PROTEINUR NEGATIVE 12/13/2017 2043   NITRITE NEGATIVE 12/13/2017 2043   LEUKOCYTESUR NEGATIVE 12/13/2017 2043   Sepsis Labs: @LABRCNTIP (procalcitonin:4,lacticidven:4) )No results found for this or any previous visit (from the past 240 hour(s)).   Radiological Exams on Admission: Dg Chest Port 1 View  Result Date: 02/28/2019 CLINICAL DATA:  Weakness EXAM: PORTABLE CHEST 1 VIEW COMPARISON:  11/26/2018 FINDINGS: Streaky atelectasis at the bases. Low lung volumes. Stable enlargement of the cardiomediastinal silhouette. Aortic atherosclerosis. No pneumothorax. IMPRESSION: No active disease. Low lung volumes and mild cardiomegaly. Mild basilar atelectasis. Electronically Signed   By: Donavan Foil M.D.   On: 02/28/2019 17:58   Dg  Foot 2 Views Right  Result Date: 02/28/2019 CLINICAL DATA:  Weakness and foot infection EXAM: RIGHT  FOOT - 2 VIEW COMPARISON:  09/13/2017 FINDINGS: Interval partial amputation of the first digit. Chronic appearing deformity at the residual metatarsal. No acute fracture or malalignment. No acute bone destruction evident. Questionable small amount of gas within the soft tissues medial side of the foot. Plantar calcaneal spur and vascular calcifications. IMPRESSION: Interval partial amputation of first digit with chronic appearing deformity at the residual metatarsal. No definite acute osseous abnormality. Questionable small amount of soft tissue gas medial side of the foot at the level of the MTP joint. Electronically Signed   By: Donavan Foil M.D.   On: 02/28/2019 18:01      Assessment/Plan Principal Problem:   Gangrene of toe of right foot (HCC) Active Problems:   Essential hypertension   Acute on chronic diastolic CHF (congestive heart failure) (HCC)   Sleep apnea   Type 2 diabetes mellitus (HCC)   COPD (chronic obstructive pulmonary disease) (HCC)   Paroxysmal atrial flutter (HCC)   PAD (peripheral artery disease) ([place])     #1 gangrene of the right second toe: Patient was admitted.  Initial IV antibiotics.  Blood and wound cultures.  Vascular surgery consulted for possible amputation.  #2 essential hypertension: Continue blood pressure control.  #3 diabetes: Continue blood sugar control and sliding scale insulin  #4 COPD: Stable no exacerbation  #5 peripheral vascular disease: Continue home regimen and follow guidelines vascular surgery.  Require repeat vascular studies.   DVT prophylaxis: Eliquis  Code Status: DNR Family Communication: No family at bedside Disposition Plan: Home Consults called: Vascular surgery for [name] Admission status: Inpatient  Severity of Illness: The appropriate patient status for this patient is INPATIENT. Inpatient status is judged to be reasonable and necessary in order to provide the required intensity of service to ensure the patient's  safety. The patient's presenting symptoms, physical exam findings, and initial radiographic and laboratory data in the context of their chronic comorbidities is felt to place them at high risk for further clinical deterioration. Furthermore, it is not anticipated that the patient will be medically stable for discharge from the hospital within 2 midnights of admission. The following factors support the patient status of inpatient.   " The patient's presenting symptoms include right foot ulcer. " The worrisome physical exam findings include visible ulcer and gangrene of the right second toe. " The initial radiographic and laboratory data are worrisome because of x-ray showing possible air a lot of to. " The chronic co-morbidities include diabetes with hypertension.   * I certify that at the point of admission it is my clinical judgment that the patient will require inpatient hospital care spanning beyond 2 midnights from the point of admission due to high intensity of service, high risk for further deterioration and high frequency of surveillance required.Barbette Merino MD [facility] Pager [phone]  If 7PM-7AM, please contact night-coverage [email or web address] Password [facility]  02/28/2019, 8:28 PM

## 2019-02-28 NOTE — ED Notes (Signed)
Patient Daughter asking for an update  Joelene Millin [phone]

## 2019-02-28 NOTE — Consult Note (Signed)
Hospital Consult    Reason for Consult: Infection right second toe Referring Physician: Dr. Francia Greaves MRN #:  [id number]  History of Present Illness: This is a 85 y.o. male has significant history of peripheral arterial disease with balloon angioplasty of the left peroneal and posterior tibial arteries and right posterior tibial artery in 2019.  Has bilateral great toe amputations.  Last saw Dr. Trula Slade in February.  At that time wounds had all healed.  Patient  does take Eliquis for paroxysmal atrial fibrillation.  Last week Eliquis 1 in the morning.  Risk factors include diabetes.  Patient also takes statin.  Past Medical History:  Diagnosis Date   Anemia    a. mild, noted 04/2017.   CAD in native artery    a. [name] 04/2017 s/p DES to D1, DES to prox-mid LAD, DES to prox LAD overlapping the prior stent, LVEF 55-65%.    Chronic diastolic CHF (congestive heart failure) (HCC)    Diabetic ulcer of toe (HCC)    DJD (degenerative joint disease) of cervical spine    Essential hypertension    GERD (gastroesophageal reflux disease)    History of hiatal hernia    HIT (heparin-induced thrombocytopenia) (HCC)    Hypothyroidism    Hypoxia    a. went home on home O2 04/2017.   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   Sinus bradycardia    a. HR 30s-40s in 04/2017 -> diltiazem stopped, metoprolol reduced.   Sleep apnea    "chose not to order CPAP at this time" (05/18/2017)   Type 2 diabetes mellitus ([place])    Wheezing    a. suspected COPD 04/2017. Former tobacco x 40 years.    Past Surgical History:  Procedure Laterality Date   ABDOMINAL AORTOGRAM W/LOWER EXTREMITY N/A 09/15/2017   Procedure: ABDOMINAL AORTOGRAM W/LOWER EXTREMITY;  Surgeon: Serafina Mitchell, MD;  Location: [place]  [facility];  Service: Cardiovascular;  Laterality: N/A;   AMPUTATION Bilateral 09/20/2017   Procedure: BILATERAL GREAT TOE AMPUTATIONS INCLUDING METATARSAL HEADS;  Surgeon: Serafina Mitchell, MD;  Location: [place];  Service: Vascular;  Laterality: Bilateral;   ANGIOPLASTY Right 09/20/2017   Procedure: ANGIOPLASTY RIGHT TIBIAL ARTERY;  Surgeon: Serafina Mitchell, MD;  Location: [facility];  Service: Vascular;  Laterality: Right;   [place]; 2012;   CATARACT EXTRACTION Left 2004   COLONOSCOPY  2004   FLEISHMAN TICS   COLONOSCOPY WITH PROPOFOL N/A 11/28/2018   Procedure: COLONOSCOPY WITH PROPOFOL;  Surgeon: Rogene Houston, MD;  [facility];  Service: Endoscopy;  Laterality: N/A;   CORONARY ANGIOPLASTY WITH STENT PLACEMENT  05/18/2017   "3 stents"   CORONARY STENT INTERVENTION N/A 05/18/2017   Procedure: CORONARY STENT INTERVENTION;  Surgeon: Jettie Booze, MD;  Location: [place] [facility];  Service: Cardiovascular;  Laterality: N/A;   ESOPHAGOGASTRODUODENOSCOPY (EGD) WITH PROPOFOL N/A 11/20/2017   Procedure: ESOPHAGOGASTRODUODENOSCOPY (EGD) WITH PROPOFOL;  Surgeon: Irene Shipper, MD;  Location: [facility] [place];  Service: Gastroenterology;  Laterality: N/A;   ESOPHAGOGASTRODUODENOSCOPY (EGD) WITH PROPOFOL N/A 11/28/2018   Procedure: ESOPHAGOGASTRODUODENOSCOPY (EGD) WITH PROPOFOL;  Surgeon: Rogene Houston, MD;  [facility];  Service: Endoscopy;  Laterality: N/A;   GIVENS CAPSULE STUDY N/A 12/17/2018   Procedure: GIVENS CAPSULE STUDY;  Surgeon: Rogene Houston, MD;  [facility];  Service: Endoscopy;  Laterality: N/A;  7:30am   I&D EXTREMITY Right 12/17/2017   Procedure: IRRIGATION AND DEBRIDEMENT RIGHT GREAT TOE;  Surgeon: Serafina Mitchell, MD;  [facility];  Service: Vascular;  Laterality: Right;   JOINT REPLACEMENT     LEFT HEART CATH AND CORONARY ANGIOGRAPHY N/A 05/18/2017   Procedure: LEFT  HEART CATH AND CORONARY ANGIOGRAPHY;  Surgeon: Jettie Booze, MD;  Location: [place] [facility];  Service: Cardiovascular;  Laterality: N/A;   LOWER EXTREMITY ANGIOGRAM Right 09/20/2017   Procedure: RIGHT LOWER LEG  ANGIOGRAM;  Surgeon: Serafina Mitchell, MD;  Location[facility];  Service: Vascular;  Laterality: Right;   LUMBAR FUSION  2002   L3, 4 L4, 5 L5 S1 Fused by Dr. Glenna Fellows   PERIPHERAL VASCULAR BALLOON ANGIOPLASTY Left 09/15/2017   Procedure: [place];  Surgeon: Serafina Mitchell, MD;  Location: [place] [facility];  Service: Cardiovascular;  Laterality: Left;  PTA of Peroneal & Posterior Tibial   POLYPECTOMY  11/28/2018   Procedure: POLYPECTOMY;  Surgeon: Rogene Houston, MD;  [facility];  Service: Endoscopy;;  duodenum   POSTERIOR LUMBAR FUSION     RHINOPLASTY     RIGHT HEART CATH N/A 05/18/2017   Procedure: RIGHT HEART CATH;  Surgeon: Jettie Booze, MD;  Location: [place] [facility];  Service: Cardiovascular;  Laterality: N/A;   TEE WITHOUT CARDIOVERSION N/A 09/18/2017   Procedure: TRANSESOPHAGEAL ECHOCARDIOGRAM (TEE);  Surgeon: Fay Records, MD;  Location: [place];  Service: Cardiovascular;  Laterality: N/A;   TOTAL KNEE ARTHROPLASTY Bilateral    TRANSURETHRAL RESECTION OF PROSTATE  2001   [place]    Allergies  Allergen Reactions   Bactrim [Sulfamethoxazole-Trimethoprim]     Pancytopenia   Codeine Shortness Of Breath   Heparin Other (See Comments)    +HIT,  Severe bleeding (with heparin drip & large doses)   Losartan Swelling    Per ENT UNSPECIFIED REACTION    Other Other (See Comments)    Severe bleeding UNSPECIFIED AGENT    Oxycodone Other (See Comments)    "Made me act out of my mind" Other reaction(s): Other (See Comments) Mental status changes hallucinations    Prior to Admission medications   Medication Sig Start Date End Date Taking? Authorizing Provider  acetaminophen (TYLENOL) 500 MG  tablet Take 500 mg by mouth as needed for mild pain or headache.   Yes [provider]  Cholecalciferol (D3-1000 PO) Take 1,000 Units by mouth daily.    Yes [provider]  Cyanocobalamin (B-12 PO) Take 1 tablet by mouth daily.    Yes [provider]  ELIQUIS 5 MG TABS tablet TAKE 1 TABLET TWICE DAILY Patient taking differently: Take 5 mg by mouth 2 (two) times daily.  02/25/19  Yes BranchAlphonse Guild, MD  folic acid (FOLVITE) 1 MG tablet Take 1 tablet (1 mg total) by mouth daily. 12/06/17  Yes Oswald Hillock, MD  furosemide (LASIX) 40 MG tablet TAKE 1 TABLET TWICE DAILY  (DOSE  CHANGE) Patient taking differently: Take 40 mg by mouth 2 (two) times daily.  11/30/18  Yes BranchAlphonse Guild, MD  insulin glargine (LANTUS) 100 UNIT/ML injection Inject 30-40 Units into the skin See admin instructions. Inject 40 units SQ in the morning and inject 30 units SQ  at bedtime   Yes [provider]  isosorbide mononitrate (IMDUR) 30 MG 24 hr tablet Take 0.5 tablets (15 mg total) by mouth daily. 04/04/18 01/25/20 Yes BranchAlphonse Guild, MD  lisinopril (ZESTRIL) 5 MG tablet Take 0.5 tablets (2.5 mg total) by mouth daily. 11/29/18  Yes Barton Dubois, MD  meclizine (ANTIVERT) 12.5 MG tablet Take 1 tablet (12.5 mg total) by mouth 3 (three) times daily as needed for dizziness. 05/30/17  Yes Weaver, Scott T, PA-C  metFORMIN (GLUCOPHAGE) 1000 MG tablet Take 1,000 mg by mouth 2 (two) times daily. 05/22/17  Yes [provider]  nitroGLYCERIN (NITROSTAT) 0.4 MG SL tablet PLACE 1 TABLET (0.4 MG TOTAL) UNDER THE TONGUE EVERY 5  MINUTES AS NEEDED FOR CHEST PAIN. Patient taking differently: Place 0.4 mg under the tongue every 5 (five) minutes as needed for chest pain.  05/22/17  Yes Branch, Alphonse Guild, MD  pantoprazole (PROTONIX) 40 MG tablet Take 1 tablet (40 mg total) by mouth 2 (two) times daily. To protect stomach while taking multiple blood thinners. Patient taking differently: Take 40 mg  by mouth every morning.  11/29/18  Yes Barton Dubois, MD  Pediatric Multivitamins-Iron [facility] COMPLETE) 18 MG CHEW Chew 1 tablet by mouth 2 (two) times daily. 02/14/19  Yes Rehman, Mechele Dawley, MD  potassium chloride SA (K-DUR) 20 MEQ tablet Take 1 tablet (20 mEq total) by mouth daily. 11/30/18  Yes Barton Dubois, MD  psyllium (METAMUCIL) 58.6 % packet Take 1 packet by mouth 2 (two) times daily.    Yes [provider]  traZODone (DESYREL) 100 MG tablet Take 100-200 mg by mouth at bedtime.  03/25/16  Yes [provider]    Social History   Socioeconomic History   Marital status: Married    Spouse name: Not on file   Number of children: Not on file   Years of education: Not on file   Highest education level: Not on file  Occupational History   Not on file  Social Needs   Financial resource strain: Somewhat hard   Food insecurity    Worry: Never true    Inability: Never true   Transportation needs    Medical: No    Non-medical: No  Tobacco Use   Smoking status: Former Smoker    Packs/day: 2.00    Years: 30.00    Pack years: 60.00    Types: Cigarettes    Start date: 03/28/1953    Quit date: 03/29/1983    Years since quitting: 35.9   Smokeless tobacco: Never Used  Substance and Sexual Activity   Alcohol use: Not Currently    Comment: 05/18/2017 'I drink a beer q yr"   Drug use: No   Sexual activity: Not on file  Lifestyle   Physical activity    Days per week: Not on file    Minutes per session: Not on file   Stress: Not at all  Relationships   Social connections    Talks on phone: Not on file    Gets together: Not on file    Attends religious service: Not on file    Active member of club or organization: Not on file    Attends meetings of clubs or organizations: Not on file    Relationship status: Not on file   Intimate partner violence    Fear of current or ex partner: Not on file    Emotionally abused: Not on file    Physically  abused: Not on file  Forced sexual activity: Not on file  Other Topics Concern   Not on file  Social History Narrative   Patient lives at home with his spouse.      Family History  Problem Relation Age of Onset   Heart attack Father 57   COPD Father    COPD Mother    Heart disease Mother    Diabetes Mother    Hypertension Sister    CVA Sister    Diabetes Sister    Multiple sclerosis Sister     ROS:  Cardiovascular: []  chest pain/pressure []  palpitations []  SOB lying flat []  DOE []  pain in legs while walking []  pain in legs at rest []  pain in legs at night []  non-healing ulcers []  hx of DVT []  swelling in legs  Pulmonary: []  productive cough []  asthma/wheezing []  home O2  Neurologic: []  weakness in []  arms []  legs []  numbness in []  arms []  legs []  hx of CVA []  mini stroke [] difficulty speaking or slurred speech []  temporary loss of vision in one eye []  dizziness  Hematologic: []  hx of cancer []  bleeding problems []  problems with blood clotting easily  Endocrine:   []  diabetes []  thyroid disease  GI []  vomiting blood []  blood in stool  GU: []  CKD/renal failure []  HD--[]  M/W/F or []  T/T/S []  burning with urination []  blood in urine  Psychiatric: []  anxiety []  depression  Musculoskeletal: []  arthritis []  joint pain  Integumentary: []  rashes [x]  ulcers  Constitutional: []  fever []  chills   Physical Examination  Vitals:   02/28/19 1808 02/28/19 2030  BP: 130/72 (!) 142/59  Pulse: 71 73  Resp: 18 (!) 23  Temp:    SpO2: 100% 99%   Body mass index is 29.13 kg/m.  General: No acute distress HENT: WNL, normocephalic Pulmonary: normal non-labored breathing Cardiac: Palpable right posterior tibial pulse Abdomen soft, NT/ND, no masses Extremities: Gangrenous changes right second toe.  Well-healed bilateral great toe amputation sites Neurologic: A&O X 3; Appropriate Affect ; SENSATION: normal; MOTOR FUNCTION:  moving all  extremities equally. Speech is fluent/normal   CBC    Component Value Date/Time   WBC 8.2 02/28/2019 1807   RBC 3.60 (L) 02/28/2019 1807   HGB 9.1 (L) 02/28/2019 1807   HGB 12.3 (L) 05/30/2017 1531   HCT 28.9 (L) 02/28/2019 1807   HCT 37.7 05/30/2017 1531   PLT 225 02/28/2019 1807   PLT 212 05/30/2017 1531   MCV 80.3 02/28/2019 1807   MCV 86 05/30/2017 1531   MCH 25.3 (L) 02/28/2019 1807   MCHC 31.5 02/28/2019 1807   RDW 18.7 (H) 02/28/2019 1807   RDW 16.0 (H) 05/30/2017 1531   LYMPHSABS 1.0 02/28/2019 1807   MONOABS 1.6 (H) 02/28/2019 1807   EOSABS 0.1 02/28/2019 1807   BASOSABS 0.0 02/28/2019 1807    BMET    Component Value Date/Time   NA 138 02/28/2019 1807   NA 139 05/30/2017 1531   K 3.8 02/28/2019 1807   CL 101 02/28/2019 1807   CO2 25 02/28/2019 1807   GLUCOSE 103 (H) 02/28/2019 1807   BUN 19 02/28/2019 1807   BUN 13 05/30/2017 1531   CREATININE 1.22 02/28/2019 1807   CREATININE 1.00 01/04/2018 1401   CALCIUM 9.0 02/28/2019 1807   GFRNONAA 54 (L) 02/28/2019 1807   GFRNONAA >60 01/04/2018 1401   GFRAA >60 02/28/2019 1807   GFRAA >60 01/04/2018 1401    COAGS: Lab Results  Component Value Date   INR 1.07 12/13/2017  INR 1.20 09/20/2017   INR 1.54 09/18/2017     Non-Invasive Vascular Imaging:   No new studies  ASSESSMENT/PLAN: This is a 85 y.o. male here with gangrenous changes right second toe.  Will need amputation.  Please hold Eliquis.  N.p.o. past midnight.  Will discuss with my partners possible second toe amputation tomorrow in the OR.  Avanthika Dehnert C. Donzetta Matters, MD [facility] Office: [phone] Pager: [phone]

## 2019-02-28 NOTE — ED Triage Notes (Signed)
Pt. Stated, it started a week ago an infection or gangrene in rt. Toe beside of the big one. The home nurse said its not working and my  Dr. Rockey Situ me to come here.

## 2019-02-28 NOTE — ED Provider Notes (Signed)
[place] EMERGENCY DEPARTMENT Provider Note   CSN: [id number] Arrival date & time: 02/28/19  1559     History   Chief Complaint Chief Complaint  Patient presents with  . Wound Infection    HPI Mike Wright is a 85 y.o. male.     85 years old male with prior medical history as detailed below presents for evaluation of infection in his right foot.  Patient reports worsening infection of the right second digit.  This is been an ongoing issue for at least the last week.  He reports that he is completed a course of outpatient antibiotics without improvement.  He denies fever.  He was sent to the hospital for admission and eventual evaluation by [name] (vascular) for likely amputation of the right second toe.  The history is provided by the patient and medical records.  Illness Location:  Diabetic foot infection of the right second toe Severity:  Moderate Onset quality:  Gradual Duration:  2 weeks Timing:  Constant Progression:  Worsening Chronicity:  New Associated symptoms: no fever     Past Medical History:  Diagnosis Date  . Anemia    a. mild, noted 04/2017.  [name] CAD in native artery    a. [name] 04/2017 s/p DES to D1, DES to prox-mid LAD, DES to prox LAD overlapping the prior stent, LVEF 55-65%.   . Chronic diastolic CHF (congestive heart failure) ([place])   . Diabetic ulcer of toe ([place])   . DJD (degenerative joint disease) of cervical spine   . Essential hypertension   . GERD (gastroesophageal reflux disease)   . History of hiatal hernia   . HIT (heparin-induced thrombocytopenia) ([place])   . Hypothyroidism   . Hypoxia    a. went home on home O2 04/2017.  [name] Insomnia   . Mixed hyperlipidemia   . PAD (peripheral artery disease) ([place])   . PAF (paroxysmal atrial fibrillation) ([place])   . PVD (peripheral vascular disease) ([place])   . Renal insufficiency   . 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  . Sinus bradycardia    a. HR 30s-40s in 04/2017 ->  diltiazem stopped, metoprolol reduced.  . Sleep apnea    "chose not to order CPAP at this time" (05/18/2017)  . Type 2 diabetes mellitus ([place])   . Wheezing    a. suspected COPD 04/2017. Former tobacco x 40 years.    Patient Active Problem List   Diagnosis Date Noted  . Gastrointestinal hemorrhage 12/10/2018  . Orthostatic hypotension   . Gastroesophageal reflux disease without esophagitis   . Chronic respiratory failure with hypoxia ([place])   . Bradycardia 11/26/2018  . Abdominal pain, epigastric 11/26/2018  . Acute blood loss anemia 11/26/2018  . PAD (peripheral artery disease) ([place]) 12/13/2017  . Non-healing surgical wound 12/13/2017  . Infected surgical wound 12/13/2017  . Paroxysmal atrial flutter ([place])   . Dyspnea 12/02/2017  . Pancytopenia ([place]) 12/02/2017  . Coronary artery disease involving native coronary artery of native heart without angina pectoris 11/20/2017  . Lower abdominal pain   . Nausea without vomiting   . Anemia 11/18/2017  . Volume overload 11/18/2017  . Chest pain 11/17/2017  . Gangrene ([place])   . Urinary retention 09/16/2017  . MRSA bacteremia 09/15/2017  . Gangrene of bilateral great toes ([place]) 09/13/2017  . COPD (chronic obstructive pulmonary disease) ([place]) 05/19/2017  . Iron deficiency anemia due to chronic blood loss   . CAD S/P percutaneous coronary angioplasty   .  Acute on chronic diastolic CHF (congestive heart failure) ([place])   . PSVT (paroxysmal supraventricular tachycardia) ([place])   . PAF (paroxysmal atrial fibrillation) ([place])   . Sleep apnea   . Type 2 diabetes mellitus ([place])   . Status post coronary artery stent placement   . Essential hypertension 04/15/2010  . Diabetes ([place]) 04/14/2010    Past Surgical History:  Procedure Laterality Date  . ABDOMINAL AORTOGRAM W/LOWER EXTREMITY N/A 09/15/2017   Procedure: ABDOMINAL AORTOGRAM W/LOWER EXTREMITY;  Surgeon: Serafina Mitchell, MD;  Location: [facility];  Service: Cardiovascular;   Laterality: N/A;  . AMPUTATION Bilateral 09/20/2017   Procedure: BILATERAL GREAT TOE AMPUTATIONS INCLUDING METATARSAL HEADS;  Surgeon: Serafina Mitchell, MD;  [facility];  Service: Vascular;  Laterality: Bilateral;  . ANGIOPLASTY Right 09/20/2017   Procedure: ANGIOPLASTY RIGHT TIBIAL ARTERY;  Surgeon: Serafina Mitchell, MD;  Location: [facility];  Service: Vascular;  Laterality: Right;  . APPENDECTOMY    . BACK SURGERY    . CARDIAC CATHETERIZATION  1980s; 2012;  . CATARACT EXTRACTION Left 2004  . COLONOSCOPY  2004   FLEISHMAN TICS  . COLONOSCOPY WITH PROPOFOL N/A 11/28/2018   Procedure: COLONOSCOPY WITH PROPOFOL;  Surgeon: Rogene Houston, MD;  [facility];  Service: Endoscopy;  Laterality: N/A;  . CORONARY ANGIOPLASTY WITH STENT PLACEMENT  05/18/2017   "3 stents"  . CORONARY STENT INTERVENTION N/A 05/18/2017   Procedure: CORONARY STENT INTERVENTION;  Surgeon: Jettie Booze, MD;  Location: [facility];  Service: Cardiovascular;  Laterality: N/A;  . ESOPHAGOGASTRODUODENOSCOPY (EGD) WITH PROPOFOL N/A 11/20/2017   Procedure: ESOPHAGOGASTRODUODENOSCOPY (EGD) WITH PROPOFOL;  Surgeon: Irene Shipper, MD;  Location: [facility];  Service: Gastroenterology;  Laterality: N/A;  . ESOPHAGOGASTRODUODENOSCOPY (EGD) WITH PROPOFOL N/A 11/28/2018   Procedure: ESOPHAGOGASTRODUODENOSCOPY (EGD) WITH PROPOFOL;  Surgeon: Rogene Houston, MD;  [facility];  Service: Endoscopy;  Laterality: N/A;  . GIVENS CAPSULE STUDY N/A 12/17/2018   Procedure: GIVENS CAPSULE STUDY;  Surgeon: Rogene Houston, MD;  [facility];  Service: Endoscopy;  Laterality: N/A;  7:30am  . I&D EXTREMITY Right 12/17/2017   Procedure: IRRIGATION AND DEBRIDEMENT RIGHT GREAT TOE;  Surgeon: Serafina Mitchell, MD;  [facility];  Service: Vascular;  Laterality: Right;  . JOINT REPLACEMENT    . LEFT HEART CATH AND CORONARY ANGIOGRAPHY N/A 05/18/2017   Procedure: LEFT HEART CATH AND CORONARY ANGIOGRAPHY;   Surgeon: Jettie Booze, MD;  Location: [facility];  Service: Cardiovascular;  Laterality: N/A;  . LOWER EXTREMITY ANGIOGRAM Right 09/20/2017   Procedure: RIGHT LOWER LEG  ANGIOGRAM;  Surgeon: Serafina Mitchell, MD;  Location: [facility];  Service: Vascular;  Laterality: Right;  . LUMBAR FUSION  2002   L3, 4 L4, 5 L5 S1 Fused by Dr. Glenna Fellows  . PERIPHERAL VASCULAR BALLOON ANGIOPLASTY Left 09/15/2017   Procedure: PERIPHERAL VASCULAR BALLOON ANGIOPLASTY;  Surgeon: Serafina Mitchell, MD;  Location: [facility];  Service: Cardiovascular;  Laterality: Left;  PTA of Peroneal & Posterior Tibial  . POLYPECTOMY  11/28/2018   Procedure: POLYPECTOMY;  Surgeon: Rogene Houston, MD;  [facility];  Service: Endoscopy;;  duodenum  . POSTERIOR LUMBAR FUSION    . RHINOPLASTY    . RIGHT HEART CATH N/A 05/18/2017   Procedure: RIGHT HEART CATH;  Surgeon: Jettie Booze, MD;  Location: [facility];  Service: Cardiovascular;  Laterality: N/A;  . TEE WITHOUT  CARDIOVERSION N/A 09/18/2017   Procedure: TRANSESOPHAGEAL ECHOCARDIOGRAM (TEE);  Surgeon: Fay Records, MD;  Location[facility] END[facility];  Service: Cardiovascular;  Laterality: N/A;  . TOTAL KNEE ARTHROPLASTY Bilateral   . TRANSURETHRAL RESECTION OF [place] Medications    Prior to Admission medications   Medication Sig Start Date End Date Taking? Authorizing Provider  acetaminophen (TYLENOL) 500 MG tablet Take 500 mg by mouth as needed for mild pain or headache.    [provider]  atorvastatin (LIPITOR) 80 MG tablet Take 1 tablet (80 mg total) by mouth every evening. 05/19/17   Dunn, Nedra Hai, PA-C  Cholecalciferol (D3-1000 PO) Take 1,000 Units by mouth daily.     [provider]  Cyanocobalamin (B-12 PO) Take 1 tablet by mouth daily.     [provider]  ELIQUIS 5 MG TABS tablet TAKE 1 TABLET TWICE DAILY 02/25/19   Arnoldo Lenis, MD  folic acid (FOLVITE) 1 MG  tablet Take 1 tablet (1 mg total) by mouth daily. 12/06/17   Oswald Hillock, MD  furosemide (LASIX) 40 MG tablet TAKE 1 TABLET TWICE DAILY  (DOSE  CHANGE) 11/30/18   Arnoldo Lenis, MD  insulin glargine (LANTUS) 100 UNIT/ML injection Inject 30-40 Units into the skin See admin instructions. Inject 40 units SQ in the morning and inject 30 units SQ at bedtime    [provider]  isosorbide mononitrate (IMDUR) 30 MG 24 hr tablet Take 0.5 tablets (15 mg total) by mouth daily. 04/04/18 01/25/20  Arnoldo Lenis, MD  lisinopril (ZESTRIL) 5 MG tablet Take 0.5 tablets (2.5 mg total) by mouth daily. 11/29/18   Barton Dubois, MD  meclizine (ANTIVERT) 12.5 MG tablet Take 1 tablet (12.5 mg total) by mouth 3 (three) times daily as needed for dizziness. 05/30/17   Richardson Dopp T, PA-C  metFORMIN (GLUCOPHAGE) 1000 MG tablet Take 1,000 mg by mouth 2 (two) times daily. 05/22/17   [provider]  nitroGLYCERIN (NITROSTAT) 0.4 MG SL tablet PLACE 1 TABLET (0.4 MG TOTAL) UNDER THE TONGUE EVERY 5  MINUTES AS NEEDED FOR CHEST PAIN. Patient taking differently: Place 0.4 mg under the tongue every 5 (five) minutes as needed.  05/22/17   Arnoldo Lenis, MD  pantoprazole (PROTONIX) 40 MG tablet Take 1 tablet (40 mg total) by mouth 2 (two) times daily. To protect stomach while taking multiple blood thinners. 11/29/18   Barton Dubois, MD  Pediatric Multivitamins-Iron [facility] COMPLETE) 18 MG CHEW Chew 1 tablet by mouth 2 (two) times daily. 02/14/19   Rehman, Mechele Dawley, MD  potassium chloride SA (K-DUR) 20 MEQ tablet Take 1 tablet (20 mEq total) by mouth daily. 11/30/18   Barton Dubois, MD  psyllium (METAMUCIL) 58.6 % packet Take 1 packet by mouth daily.    [provider]  traZODone (DESYREL) 100 MG tablet Take 100-200 mg by mouth at bedtime.  03/25/16   [provider]    Family History Family History  Problem Relation Age of Onset  . Heart attack Father 32  . COPD Father   . COPD  Mother   . Heart disease Mother   . Diabetes Mother   . Hypertension Sister   . CVA Sister   . Diabetes Sister   . Multiple sclerosis Sister     Social History Social History   Tobacco Use  . Smoking status: Former Smoker    Packs/day: 2.00    Years:  30.00    Pack years: 60.00    Types: Cigarettes    Start date: 03/28/1953    Quit date: 03/29/1983    Years since quitting: 35.9  . Smokeless tobacco: Never Used  Substance Use Topics  . Alcohol use: Not Currently    Comment: 05/18/2017 'I drink a beer q yr"  . Drug use: No     Allergies   Bactrim [sulfamethoxazole-trimethoprim], Codeine, Heparin, Losartan, Other, and Oxycodone   Review of Systems Review of Systems  Constitutional: Negative for fever.  All other systems reviewed and are negative.    Physical Exam Updated Vital Signs BP 134/84   Pulse 84   Temp 98.2 F (36.8 C) (Oral)   Resp 16   Ht 5\' 7"  (1.702 m)   Wt 84.4 kg   SpO2 100%   BMI 29.13 kg/m   Physical Exam Vitals signs and nursing note reviewed.  Constitutional:      General: He is not in acute distress.    Appearance: Normal appearance. He is well-developed.  HENT:     Head: Normocephalic and atraumatic.  Eyes:     Conjunctiva/sclera: Conjunctivae normal.     Pupils: Pupils are equal, round, and reactive to light.  Neck:     Musculoskeletal: Normal range of motion and neck supple.  Cardiovascular:     Rate and Rhythm: Normal rate and regular rhythm.     Heart sounds: Normal heart sounds.  Pulmonary:     Effort: Pulmonary effort is normal. No respiratory distress.     Breath sounds: Normal breath sounds.  Abdominal:     General: There is no distension.     Palpations: Abdomen is soft.     Tenderness: There is no abdominal tenderness.  Musculoskeletal: Normal range of motion.        General: No deformity.     Comments: Right second digit is erythematous and edematous.  No crepitus noted to the foot.  No streaking or proximal erythema  noted in the foot.  Skin:    General: Skin is warm and dry.  Neurological:     General: No focal deficit present.     Mental Status: He is alert and oriented to person, place, and time. Mental status is at baseline.      ED Treatments / Results  Labs (all labs ordered are listed, but only abnormal results are displayed) Labs Reviewed  BASIC METABOLIC PANEL - Abnormal; Notable for the following components:      Result Value   Glucose, Bld 103 (*)    GFR calc non Af Amer 54 (*)    All other components within normal limits  CBC WITH DIFFERENTIAL/PLATELET - Abnormal; Notable for the following components:   RBC 3.60 (*)    Hemoglobin 9.1 (*)    HCT 28.9 (*)    MCH 25.3 (*)    RDW 18.7 (*)    Monocytes Absolute 1.6 (*)    Abs Immature Granulocytes 0.09 (*)    All other components within normal limits  SARS CORONAVIRUS 2 (TAT 6-24 HRS)    EKG EKG Interpretation  Date/Time:  Thursday February 28 2019 19:18:32 EST Ventricular Rate:  61 PR Interval:    QRS Duration: 97 QT Interval:  425 QTC Calculation: 429 R Axis:   52 Text Interpretation: Atrial fibrillation Nonspecific T abnormalities, lateral leads Confirmed by Dene Gentry [phone]) on 02/28/2019 7:20:04 PM   Radiology Dg Chest Port 1 View  Result Date: 02/28/2019 CLINICAL DATA:  Weakness  EXAM: PORTABLE CHEST 1 VIEW COMPARISON:  11/26/2018 FINDINGS: Streaky atelectasis at the bases. Low lung volumes. Stable enlargement of the cardiomediastinal silhouette. Aortic atherosclerosis. No pneumothorax. IMPRESSION: No active disease. Low lung volumes and mild cardiomegaly. Mild basilar atelectasis. Electronically Signed   By: Donavan Foil M.D.   On: 02/28/2019 17:58   Dg Foot 2 Views Right  Result Date: 02/28/2019 CLINICAL DATA:  Weakness and foot infection EXAM: RIGHT FOOT - 2 VIEW COMPARISON:  09/13/2017 FINDINGS: Interval partial amputation of the first digit. Chronic appearing deformity at the residual metatarsal. No acute  fracture or malalignment. No acute bone destruction evident. Questionable small amount of gas within the soft tissues medial side of the foot. Plantar calcaneal spur and vascular calcifications. IMPRESSION: Interval partial amputation of first digit with chronic appearing deformity at the residual metatarsal. No definite acute osseous abnormality. Questionable small amount of soft tissue gas medial side of the foot at the level of the MTP joint. Electronically Signed   By: Donavan Foil M.D.   On: 02/28/2019 18:01    Procedures Procedures (including critical care time)  Medications Ordered in ED Medications  piperacillin-tazobactam (ZOSYN) IVPB 3.375 g (has no administration in time range)     Initial Impression / Assessment and Plan / ED Course  I have reviewed the triage vital signs and the nursing notes.  Pertinent labs & imaging results that were available during my care of the patient were reviewed by me and considered in my medical decision making (see chart for details).        MDM  Screen complete  YOSUKE GITTENS was evaluated in Emergency Department on 02/28/2019 for the symptoms described in the history of present illness. He was evaluated in the context of the global COVID-19 pandemic, which necessitated consideration that the patient might be at risk for infection with the SARS-CoV-2 virus that causes COVID-19. Institutional protocols and algorithms that pertain to the evaluation of patients at risk for COVID-19 are in a state of rapid change based on information released by regulatory bodies including the CDC and federal and state organizations. These policies and algorithms were followed during the patient's care in the ED.  Patient is presenting with infection of the right second toe.  He was sent for IV antibiotics and possible surgical intervention.  Hospitalist service Jonelle Sidle) is aware of case and will admit.  Vascular service Donzetta Matters) is consulted.    Final  Clinical Impressions(s) / ED Diagnoses   Final diagnoses:  Diabetic foot infection [facility])    ED Discharge Orders    None       Valarie Merino, MD 02/28/19 2018

## 2019-03-01 ENCOUNTER — Encounter (HOSPITAL_COMMUNITY): Payer: Self-pay

## 2019-03-01 ENCOUNTER — Inpatient Hospital Stay (HOSPITAL_COMMUNITY): Payer: Medicare HMO | Admitting: Anesthesiology

## 2019-03-01 ENCOUNTER — Encounter (HOSPITAL_COMMUNITY)
Admission: EM | Disposition: A | Discharge status: Home Health | Payer: Self-pay | Source: Home / Self Care | Attending: Family Medicine

## 2019-03-01 DIAGNOSIS — I96 Gangrene, not elsewhere classified: Secondary | ICD-10-CM | POA: Diagnosis not present

## 2019-03-01 HISTORY — PX: AMPUTATION: SHX166

## 2019-03-01 LAB — COMPREHENSIVE METABOLIC PANEL
ALT: 20 U/L (ref 0–44)
AST: 23 U/L (ref 15–41)
Albumin: 3 g/dL — ABNORMAL LOW (ref 3.5–5.0)
Alkaline Phosphatase: 77 U/L (ref 38–126)
Anion gap: 10 (ref 5–15)
BUN: 21 mg/dL (ref 8–23)
CO2: 26 mmol/L (ref 22–32)
Calcium: 8.6 mg/dL — ABNORMAL LOW (ref 8.9–10.3)
Chloride: 101 mmol/L (ref 98–111)
Creatinine, Ser: 1.17 mg/dL (ref 0.61–1.24)
GFR calc Af Amer: >60 mL/min (ref >60)
GFR calc non Af Amer: 57 mL/min — ABNORMAL LOW (ref >60)
Glucose, Bld: 107 mg/dL — ABNORMAL HIGH (ref 70–99)
Potassium: 3.4 mmol/L — ABNORMAL LOW (ref 3.5–5.1)
Sodium: 137 mmol/L (ref 135–145)
Total Bilirubin: 0.8 mg/dL (ref 0.3–1.2)
Total Protein: 6.1 g/dL — ABNORMAL LOW (ref 6.5–8.1)

## 2019-03-01 LAB — SARS CORONAVIRUS 2 (TAT 6-24 HRS): SARS Coronavirus 2: NEGATIVE

## 2019-03-01 LAB — LIPID PANEL
Cholesterol: 68 mg/dL (ref 0–200)
HDL: 16 mg/dL — ABNORMAL LOW (ref >40)
LDL Cholesterol: 29 mg/dL (ref 0–99)
Total CHOL/HDL Ratio: 4.3 RATIO
Triglycerides: 114 mg/dL (ref <150)
VLDL: 23 mg/dL (ref 0–40)

## 2019-03-01 LAB — GLUCOSE, CAPILLARY
Glucose-Capillary: 74 mg/dL (ref 70–99)
Glucose-Capillary: 82 mg/dL (ref 70–99)
Glucose-Capillary: 81 mg/dL (ref 70–99)
Glucose-Capillary: 252 mg/dL — ABNORMAL HIGH (ref 70–99)
Glucose-Capillary: 219 mg/dL — ABNORMAL HIGH (ref 70–99)

## 2019-03-01 LAB — CBC
HCT: 26.8 % — ABNORMAL LOW (ref 39.0–52.0)
Hemoglobin: 8.7 g/dL — ABNORMAL LOW (ref 13.0–17.0)
MCH: 25.6 pg — ABNORMAL LOW (ref 26.0–34.0)
MCHC: 32.5 g/dL (ref 30.0–36.0)
MCV: 78.8 fL — ABNORMAL LOW (ref 80.0–100.0)
Platelets: 208 10*3/uL (ref 150–400)
RBC: 3.4 MIL/uL — ABNORMAL LOW (ref 4.22–5.81)
RDW: 18.7 % — ABNORMAL HIGH (ref 11.5–15.5)
WBC: 6.8 10*3/uL (ref 4.0–10.5)
nRBC: 0 % (ref 0.0–0.2)

## 2019-03-01 LAB — MRSA PCR SCREENING: MRSA by PCR: NEGATIVE

## 2019-03-01 LAB — HEMOGLOBIN A1C
Hgb A1c MFr Bld: 7.1 % — ABNORMAL HIGH (ref 4.8–5.6)
Mean Plasma Glucose: 157.07 mg/dL

## 2019-03-01 SURGERY — AMPUTATION, FOOT, PARTIAL
Anesthesia: General | Site: Foot | Laterality: Right

## 2019-03-01 MED ORDER — CEFAZOLIN SODIUM-DEXTROSE 2-3 GM-%(50ML) IV SOLR
INTRAVENOUS | Status: DC | PRN
  Administered 2019-03-01: 2 g via INTRAVENOUS

## 2019-03-01 MED ORDER — PHENYLEPHRINE 40 MCG/ML (10ML) SYRINGE FOR IV PUSH (FOR BLOOD PRESSURE SUPPORT)
PREFILLED_SYRINGE | INTRAVENOUS | Status: DC | PRN
  Administered 2019-03-01 (×3): 80 ug via INTRAVENOUS

## 2019-03-01 MED ORDER — HYDROMORPHONE HCL 1 MG/ML IJ SOLN: 0.5000 mg | INTRAMUSCULAR | Status: DC | PRN

## 2019-03-01 MED ORDER — DEXAMETHASONE SODIUM PHOSPHATE 10 MG/ML IJ SOLN
INTRAMUSCULAR | Status: AC
  Filled 2019-03-01: qty 1

## 2019-03-01 MED ORDER — FENTANYL CITRATE (PF) 250 MCG/5ML IJ SOLN
INTRAMUSCULAR | Status: DC | PRN
  Administered 2019-03-01 (×2): 25 ug via INTRAVENOUS

## 2019-03-01 MED ORDER — FUROSEMIDE 40 MG PO TABS
40.0000 mg | ORAL_TABLET | Freq: Two times a day (BID) | ORAL | Status: DC
  Administered 2019-03-02 – 2019-03-03 (×3): 40 mg via ORAL
  Filled 2019-03-01 (×4): qty 1

## 2019-03-01 MED ORDER — LIDOCAINE 2% (20 MG/ML) 5 ML SYRINGE
INTRAMUSCULAR | Status: DC | PRN
  Administered 2019-03-01: 60 mg via INTRAVENOUS

## 2019-03-01 MED ORDER — IBUPROFEN 200 MG PO TABS
600.0000 mg | ORAL_TABLET | Freq: Four times a day (QID) | ORAL | Status: DC | PRN
  Administered 2019-03-02: 600 mg via ORAL
  Filled 2019-03-01: qty 3

## 2019-03-01 MED ORDER — LISINOPRIL 5 MG PO TABS
2.5000 mg | ORAL_TABLET | Freq: Every day | ORAL | Status: DC
  Administered 2019-03-02 – 2019-03-03 (×2): 2.5 mg via ORAL
  Filled 2019-03-01 (×2): qty 1

## 2019-03-01 MED ORDER — BACITRACIN ZINC 500 UNIT/GM EX OINT
TOPICAL_OINTMENT | CUTANEOUS | Status: DC | PRN
  Administered 2019-03-01: 1 via TOPICAL

## 2019-03-01 MED ORDER — DEXAMETHASONE SODIUM PHOSPHATE 10 MG/ML IJ SOLN
INTRAMUSCULAR | Status: DC | PRN
  Administered 2019-03-01: 4 mg via INTRAVENOUS

## 2019-03-01 MED ORDER — CEFAZOLIN SODIUM 1 G IJ SOLR
INTRAMUSCULAR | Status: AC
  Filled 2019-03-01: qty 20

## 2019-03-01 MED ORDER — PROMETHAZINE HCL 25 MG/ML IJ SOLN: 6.2500 mg | INTRAMUSCULAR | Status: DC | PRN

## 2019-03-01 MED ORDER — BACITRACIN ZINC 500 UNIT/GM EX OINT
TOPICAL_OINTMENT | CUTANEOUS | Status: AC
  Filled 2019-03-01: qty 28.35

## 2019-03-01 MED ORDER — PROPOFOL 10 MG/ML IV BOLUS
INTRAVENOUS | Status: DC | PRN
  Administered 2019-03-01: 50 mg via INTRAVENOUS
  Administered 2019-03-01: 100 mg via INTRAVENOUS

## 2019-03-01 MED ORDER — ONDANSETRON HCL 4 MG/2ML IJ SOLN
INTRAMUSCULAR | Status: AC
  Filled 2019-03-01: qty 2

## 2019-03-01 MED ORDER — ONDANSETRON HCL 4 MG/2ML IJ SOLN
INTRAMUSCULAR | Status: DC | PRN
  Administered 2019-03-01: 4 mg via INTRAVENOUS

## 2019-03-01 MED ORDER — LACTATED RINGERS IV SOLN
INTRAVENOUS | Status: DC
  Administered 2019-03-01: 11:00:00 via INTRAVENOUS

## 2019-03-01 MED ORDER — PIPERACILLIN-TAZOBACTAM 3.375 G IVPB
3.3750 g | Freq: Three times a day (TID) | INTRAVENOUS | Status: DC
  Administered 2019-03-01 – 2019-03-03 (×7): 3.375 g via INTRAVENOUS
  Filled 2019-03-01 (×8): qty 50

## 2019-03-01 MED ORDER — FENTANYL CITRATE (PF) 100 MCG/2ML IJ SOLN: 25.0000 ug | INTRAMUSCULAR | Status: DC | PRN

## 2019-03-01 MED ORDER — FENTANYL CITRATE (PF) 250 MCG/5ML IJ SOLN
INTRAMUSCULAR | Status: AC
  Filled 2019-03-01: qty 5

## 2019-03-01 MED ORDER — FENTANYL CITRATE (PF) 100 MCG/2ML IJ SOLN
12.5000 ug | INTRAMUSCULAR | Status: DC | PRN
  Administered 2019-03-01 (×2): 12.5 ug via INTRAVENOUS
  Filled 2019-03-01 (×2): qty 2

## 2019-03-01 SURGICAL SUPPLY — 35 items
BLADE LONG MED 31MMX9MM (MISCELLANEOUS)
BLADE LONG MED 31X9 (MISCELLANEOUS) IMPLANT
BLADE SAW RECIP 87.9 MT (BLADE) ×2 IMPLANT
BNDG CONFORM 3 STRL LF (GAUZE/BANDAGES/DRESSINGS) ×1 IMPLANT
BNDG ELASTIC 4X5.8 VLCR STR LF (GAUZE/BANDAGES/DRESSINGS) ×3 IMPLANT
BNDG GAUZE ELAST 4 BULKY (GAUZE/BANDAGES/DRESSINGS) ×3 IMPLANT
CANISTER SUCT 3000ML PPV (MISCELLANEOUS) ×3 IMPLANT
COVER SURGICAL LIGHT HANDLE (MISCELLANEOUS) ×3 IMPLANT
COVER WAND RF STERILE (DRAPES) ×1 IMPLANT
DRAPE EXTREMITY T 121X128X90 (DISPOSABLE) ×2 IMPLANT
DRAPE HALF SHEET 40X57 (DRAPES) ×3 IMPLANT
ELECT REM PT RETURN 9FT ADLT (ELECTROSURGICAL) ×3
ELECTRODE REM PT RTRN 9FT ADLT (ELECTROSURGICAL) ×1 IMPLANT
GAUZE SPONGE 4X4 12PLY STRL (GAUZE/BANDAGES/DRESSINGS) ×3 IMPLANT
GAUZE SPONGE 4X4 12PLY STRL LF (GAUZE/BANDAGES/DRESSINGS) ×2 IMPLANT
GLOVE BIO SURGEON STRL SZ7.5 (GLOVE) ×3 IMPLANT
GLOVE BIOGEL PI IND STRL 6 (GLOVE) IMPLANT
GLOVE BIOGEL PI IND STRL 7.5 (GLOVE) IMPLANT
GLOVE BIOGEL PI IND STRL 8 (GLOVE) ×1 IMPLANT
GLOVE BIOGEL PI INDICATOR 6 (GLOVE) ×2
GLOVE BIOGEL PI INDICATOR 7.5 (GLOVE) ×2
GLOVE BIOGEL PI INDICATOR 8 (GLOVE) ×2
GOWN STRL REUS W/ TWL LRG LVL3 (GOWN DISPOSABLE) ×3 IMPLANT
GOWN STRL REUS W/TWL LRG LVL3 (GOWN DISPOSABLE) ×4
KIT BASIN OR (CUSTOM PROCEDURE TRAY) ×3 IMPLANT
KIT TURNOVER KIT B (KITS) ×3 IMPLANT
NS IRRIG 1000ML POUR BTL (IV SOLUTION) ×3 IMPLANT
PACK GENERAL/GYN (CUSTOM PROCEDURE TRAY) ×3 IMPLANT
PAD ARMBOARD 7.5X6 YLW CONV (MISCELLANEOUS) ×6 IMPLANT
SPECIMEN JAR SMALL (MISCELLANEOUS) ×1 IMPLANT
SUT ETHILON 3 0 PS 1 (SUTURE) ×3 IMPLANT
TOWEL GREEN STERILE (TOWEL DISPOSABLE) ×6 IMPLANT
TOWEL GREEN STERILE FF (TOWEL DISPOSABLE) ×3 IMPLANT
UNDERPAD 30X30 (UNDERPADS AND DIAPERS) ×3 IMPLANT
WATER STERILE IRR 1000ML POUR (IV SOLUTION) ×3 IMPLANT

## 2019-03-01 NOTE — H&P (View-Only) (Signed)
  Progress Note    03/01/2019 8:22 AM * No surgery found *  Subjective:  No new issues  Vitals:   03/01/19 0403 03/01/19 0754  BP: 115/65 123/66  Pulse: 61 63  Resp: 15 17  Temp: 98.1 F (36.7 C) 98.3 F (36.8 C)  SpO2: 94% 94%    Physical Exam: [id number] Right 2nd toe with gangrenous changes,partial 3rd toe may be involved Palpable right pt pulse   CBC    Component Value Date/Time   WBC 6.8 03/01/2019 0320   RBC 3.40 (L) 03/01/2019 0320   HGB 8.7 (L) 03/01/2019 0320   HGB 12.3 (L) 05/30/2017 1531   HCT 26.8 (L) 03/01/2019 0320   HCT 37.7 05/30/2017 1531   PLT 208 03/01/2019 0320   PLT 212 05/30/2017 1531   MCV 78.8 (L) 03/01/2019 0320   MCV 86 05/30/2017 1531   MCH 25.6 (L) 03/01/2019 0320   MCHC 32.5 03/01/2019 0320   RDW 18.7 (H) 03/01/2019 0320   RDW 16.0 (H) 05/30/2017 1531   LYMPHSABS 1.0 02/28/2019 1807   MONOABS 1.6 (H) 02/28/2019 1807   EOSABS 0.1 02/28/2019 1807   BASOSABS 0.0 02/28/2019 1807    BMET    Component Value Date/Time   NA 137 03/01/2019 0320   NA 139 05/30/2017 1531   K 3.4 (L) 03/01/2019 0320   CL 101 03/01/2019 0320   CO2 26 03/01/2019 0320   GLUCOSE 107 (H) 03/01/2019 0320   BUN 21 03/01/2019 0320   BUN 13 05/30/2017 1531   CREATININE 1.17 03/01/2019 0320   CREATININE 1.00 01/04/2018 1401   CALCIUM 8.6 (L) 03/01/2019 0320   GFRNONAA 57 (L) 03/01/2019 0320   GFRNONAA >60 01/04/2018 1401   GFRAA >60 03/01/2019 0320   GFRAA >60 01/04/2018 1401    INR    Component Value Date/Time   INR 1.07 12/13/2017 1623     Intake/Output Summary (Last 24 hours) at 03/01/2019 [id number] Last data filed at 03/01/2019 0316 Gross per 24 hour  Intake 293.4 ml  Output 350 ml  Net -56.6 ml     Assessment/plan:  85 y.o. male is s/p previous revascularization right posterior tibial artery.  Has a palpable pulse.  Has gangrenous changes to his right second toe after first toe amputation that is healed well.  We will plan for amputation of the  second possible third toe today in the OR.    C. Donzetta Matters, MD [facility] Office: [phone] Pager: [phone]  03/01/2019 8:22 AM

## 2019-03-01 NOTE — Plan of Care (Signed)
  Problem: Education: Goal: Knowledge of General Education information will improve Description Including pain rating scale, medication(s)/side effects and non-pharmacologic comfort measures Outcome: Progressing   

## 2019-03-01 NOTE — Transfer of Care (Signed)
Immediate Anesthesia Transfer of Care Note  Patient: Mike Wright  Procedure(s) Performed: AMPUTATION TOES (Right Foot)  Patient Location: PACU  Anesthesia Type:General  Level of Consciousness: awake  Airway & Oxygen Therapy: Patient Spontanous Breathing and Patient connected to face mask oxygen  Post-op Assessment: Report given to RN and Post -op Vital signs reviewed and stable  Post vital signs: Reviewed  Last Vitals:  Vitals Value Taken Time  BP 127/68 03/01/19 1209  Temp    Pulse 68 03/01/19 1212  Resp 21 03/01/19 1212  SpO2 94 % 03/01/19 1212  Vitals shown include unvalidated device data.  Last Pain:  Vitals:   03/01/19 1105  TempSrc:   PainSc: 0-No pain      Patients Stated Pain Goal: 3 ([id number])  Complications: No apparent anesthesia complications

## 2019-03-01 NOTE — Anesthesia Procedure Notes (Signed)
Procedure Name: LMA Insertion Date/Time: 03/01/2019 11:37 AM Performed by: Janene Harvey, CRNA Pre-anesthesia Checklist: Patient identified, Emergency Drugs available, Suction available and Patient being monitored Patient Re-evaluated:Patient Re-evaluated prior to induction Oxygen Delivery Method: Circle system utilized Preoxygenation: Pre-oxygenation with 100% oxygen Induction Type: IV induction LMA: LMA inserted LMA Size: 4.0 Dental Injury: Teeth and Oropharynx as per pre-operative assessment

## 2019-03-01 NOTE — Interval H&P Note (Signed)
History and Physical Interval Note:  03/01/2019 10:46 AM  Mike Wright  has presented today for surgery, with the diagnosis of right gangrene foot.  The various methods of treatment have been discussed with the patient and family. After consideration of risks, benefits and other options for treatment, the patient has consented to  Procedure(s): AMPUTATION TOES (Right) as a surgical intervention.  The patient's history has been reviewed, patient examined, no change in status, stable for surgery.  I have reviewed the patient's chart and labs.  Questions were answered to the patient's satisfaction.     [name]

## 2019-03-01 NOTE — Progress Notes (Addendum)
PROGRESS NOTE    Patient: Mike Wright                            PCP: Monico Blitz, MD                    DOB: 15-Sep-1933            DOA: 02/28/2019 [place]:[id number]             DOS: 03/01/2019, 8:05 AM   LOS: 1 day   Date of Service: The patient was seen and examined on 03/01/2019  Subjective:   The patient was seen and examined this morning, remained stable Afebrile normotensive Has been n.p.o. overnight in anticipation of surgery today. No complaints.  Brief Narrative:   Mike Wright is a 85 y.o. M,  PMh/o PVD, with multiple previous irritations of the toes especially the 2 great toes, DM II, HTN, CAD HLD, Anemia of chronic disease, dCHF GERD who presents to the ER with right second toe infection which has gradually worsened over the last 2 weeks.   It started as usual diabetic foot infection he was seen by his PCP and sent back to vascular surgery already done previous amputation.  The patient has not seen improvement despite trial of some antibiotics.   Denies any fever or chills denied any nausea but no diarrhea.  Patient has noted gradual worsening.  No injury.  He was admitted with infection and gangrene of the right second toe.  ED Course: Vitals are stable, chemistry appears to be stable and within normal result.  CBC also looks good except for hemoglobin 9.1.  Chest x-ray showed no acute finding x-ray of the right foot showed gas and MD second metatarsal possibly indicating infection.     Assessment & Plan:   Principal Problem:   Gangrene of toe of right foot ([place]) Active Problems:   Essential hypertension   Acute on chronic diastolic CHF (congestive heart failure) (HCC)   Sleep apnea   Type 2 diabetes mellitus (HCC)   COPD (chronic obstructive pulmonary disease) (HCC)   Paroxysmal atrial flutter (HCC)   PAD (peripheral artery disease) (HCC)    Gangrene of the right second toe/in the setting of nonhealing diabetic foot wound -Remained stable, afebrile  normotensive -Continue aggressive IV antibiotic treatment with Zosyn -Follow-up with Blood/wound cultures  -Vascular team [name] has been consulted upon admission  Recommending holding Eliquis, n.p.o. after midnight -possible second toe amputation 03/01/2019  Essential hypertension:  -As needed IV hydralazine, PO clonidine -At this time we will hold patient's ACE inhibitor's, Continue blood pressure control.  Diabetes:  -Hold home regimen Shanda Bumps, tinea Lantus -Check CBG every 6 hours while n.p.o., then QA CHS, SSI   COPD:  Stable no exacerbation   Peripheral vascular disease:  -Patient is on Eliquis -we will hold today in anticipation of surgery, amputation of fifth Continue home regimen and follow guidelines vascular surgery.  Require repeat vascular studies.  History of A-fib/CAD/ dCHF -Monitoring daily weights, I's and O's -On Lasix, lisinopril,- will hold today -As needed IV Lasix -Continue Imdur, not on beta-blocker -Last Echo: 09/14/2017, ejection fraction 55-60%, moderate LVH, -Eliquis on hold, will resume as soon as cleared by vascular   History of chronic anemia -Monitoring H&H closely  Hyperlipidemia -Not on any statins, checking lipid panel  DVT prophylaxis: Eliquis (holding for possible IV antibiotics, amputation)/SCDs Code Status: DNR Family Communication: No family at bedside  Disposition Plan: Home Consults called: Vascular surgery for [name] Admission status: Inpatient -needing surgical revision, IV antibiotics  2D echocardiogram 09/14/2017: Study Conclusions  - Left ventricle: The cavity size was normal. Wall thickness was   increased in a pattern of moderate LVH. Systolic function was   normal. The estimated ejection fraction was in the range of 55%   to 60%. Wall motion was normal; there were no regional wall   motion abnormalities. - Aortic valve: Trileaflet; moderately thickened, moderately   calcified leaflets. Valve mobility was  restricted. There was mild   stenosis. There was mild regurgitation. Peak velocity (S): 277   cm/s. Mean gradient (S): 16 mm Hg. Valve area (VTI): 2.01 cm^2.   Valve area (Vmax): 1.8 cm^2. Valve area (Vmean): 1.73 cm^2. - Mitral valve: There was mild regurgitation. Valve area by  Procedures:   No admission procedures for hospital encounter.    Antimicrobials:  Anti-infectives (From admission, onward)   Start     Dose/Rate Route Frequency Ordered Stop   03/01/19 0200  piperacillin-tazobactam (ZOSYN) IVPB 3.375 g     3.375 g 12.5 mL/hr over 240 Minutes Intravenous Every 8 hours 03/01/19 0111     02/28/19 1700  piperacillin-tazobactam (ZOSYN) IVPB 3.375 g     3.375 g 100 mL/hr over 30 Minutes Intravenous  Once 02/28/19 1658 02/28/19 1842       Medication:  . cholecalciferol  1,000 Units Oral Daily  . folic acid  1 mg Oral Daily  . furosemide  40 mg Oral BID  . insulin aspart  0-5 Units Subcutaneous QHS  . insulin aspart  0-9 Units Subcutaneous TID WC  . insulin glargine  40 Units Subcutaneous Daily   And  . insulin glargine  30 Units Subcutaneous QHS  . isosorbide mononitrate  15 mg Oral Daily  . lisinopril  2.5 mg Oral Daily  . metFORMIN  1,000 mg Oral BID WC  . multivitamin with minerals  1 tablet Oral Daily  . pantoprazole  40 mg Oral Daily  . potassium chloride SA  20 mEq Oral Daily  . psyllium  1 packet Oral BID WC  . traZODone  100-200 mg Oral QHS  . vitamin B-12  1,000 mcg Oral Daily    acetaminophen **OR** acetaminophen, acetaminophen, meclizine, nitroGLYCERIN, ondansetron **OR** ondansetron (ZOFRAN) IV   Objective:   Vitals:   02/28/19 2030 02/28/19 2208 03/01/19 0403 03/01/19 0754  BP: (!) 142/59 118/79 115/65 123/66  Pulse: 73 65 61 63  Resp: (!) 23 14 15 17   Temp:  97.9 F (36.6 C) 98.1 F (36.7 C) 98.3 F (36.8 C)  TempSrc:  Oral Oral Oral  SpO2: 99% 100% 94% 94%  Weight:      Height:        Intake/Output Summary (Last 24 hours) at 03/01/2019  0805 Last data filed at 03/01/2019 [id number] Gross per 24 hour  Intake 293.4 ml  Output 350 ml  Net -56.6 ml   Filed Weights   02/28/19 1610  Weight: 84.4 kg     Examination:   Physical Exam  Constitution:  Alert, cooperative, no distress,  Appears calm and comfortable  Psychiatric: Normal and stable mood and affect, cognition intact,   HEENT: Normocephalic, PERRL, otherwise with in Normal limits  Chest:Chest symmetric Cardio vascular:  S1/S2, RRR, No murmure, No Rubs or Gallops  pulmonary: Clear to auscultation bilaterally, respirations unlabored, negative wheezes / crackles Abdomen: Soft, non-tender, non-distended, bowel sounds,no masses, no organomegaly Muscular skeletal: Limited exam -  in bed, able to move all 4 extremities, Normal strength,  Neuro: CNII-XII intact. , normal motor and sensation, reflexes intact  Extremities: No pitting edema lower extremities, +2 pulses  Skin: Dry, warm to touch, negative for any Rashes, No open wounds Wounds:     Bilateral great toe amputated, right second toe black eschar, chronic       LABs:  CBC Latest Ref Rng & Units 03/01/2019 02/28/2019 02/13/2019  WBC 4.0 - 10.5 K/uL 6.8 8.2 6.3  Hemoglobin 13.0 - 17.0 g/dL 8.7(L) 9.1(L) 9.7(L)  Hematocrit 39.0 - 52.0 % 26.8(L) 28.9(L) 30.4(L)  Platelets 150 - 400 K/uL 208 225 159   CMP Latest Ref Rng & Units 03/01/2019 02/28/2019 11/28/2018  Glucose 70 - 99 mg/dL 107(H) 103(H) 188(H)  BUN 8 - 23 mg/dL 21 19 17   Creatinine 0.61 - 1.24 mg/dL 1.17 1.22 1.05  Sodium 135 - 145 mmol/L 137 138 137  Potassium 3.5 - 5.1 mmol/L 3.4(L) 3.8 3.6  Chloride 98 - 111 mmol/L 101 101 102  CO2 22 - 32 mmol/L 26 25 27   Calcium 8.9 - 10.3 mg/dL 8.6(L) 9.0 8.7(L)  Total Protein 6.5 - 8.1 g/dL 6.1(L) - -  Total Bilirubin 0.3 - 1.2 mg/dL 0.8 - -  Alkaline Phos 38 - 126 U/L 77 - -  AST 15 - 41 U/L 23 - -  ALT 0 - 44 U/L 20 - -        SIGNED: Deatra James, MD, FACP, FHM. [facility],  Pager [phone]  If 7PM-7AM, please contact night-coverage [email or web address], Password [facility] 03/01/2019, 8:05 AM

## 2019-03-01 NOTE — Progress Notes (Signed)
Orthopedic Tech Progress Note Patient Details:  Mike Wright [birth year] [id number]  Ortho Devices Type of Ortho Device: Darco shoe Ortho Device/Splint Location: RLE Ortho Device/Splint Interventions: Ordered, Application   Post Interventions Patient Tolerated: Well Instructions Provided: Care of device   Braulio Bosch 03/01/2019, 1:02 PM

## 2019-03-01 NOTE — Anesthesia Postprocedure Evaluation (Signed)
Anesthesia Post Note  Patient: Mike Wright  Procedure(s) Performed: AMPUTATION TOES (Right Foot)     Patient location during evaluation: PACU Anesthesia Type: General Level of consciousness: awake and alert Pain management: pain level controlled Vital Signs Assessment: post-procedure vital signs reviewed and stable Respiratory status: spontaneous breathing, nonlabored ventilation, respiratory function stable and patient connected to nasal cannula oxygen Cardiovascular status: blood pressure returned to baseline and stable Postop Assessment: no apparent nausea or vomiting Anesthetic complications: no    Last Vitals:  Vitals:   03/01/19 1223 03/01/19 1239  BP: (!) 142/67 138/75  Pulse: 62 (!) 59  Resp: 19 20  Temp:    SpO2: (!) 89% 90%    Last Pain:  Vitals:   03/01/19 1105  TempSrc:   PainSc: 0-No pain                 Krish Bailly,Edder S

## 2019-03-01 NOTE — Plan of Care (Signed)

## 2019-03-01 NOTE — Op Note (Signed)
    NAME: Mike Wright    MRN: [id number] DOB: 1933/08/19    DATE OF OPERATION: 03/01/2019  PREOP DIAGNOSIS:    Gangrenous right second toe  POSTOP DIAGNOSIS:    Same  PROCEDURE:    Amputation of right second toe  SURGEON: Judeth Cornfield. Scot Dock, MD  ASSIST: None  ANESTHESIA: General  EBL: Minimal  INDICATIONS:    Mike Wright is a 85 y.o. male who is undergone a previous right great toe amputation.  He presents for right second toe amputation as he subsequently developed gangrene of the right second toe.  FINDINGS:   Good bleeding at the site of the amputation.  The amputation was through the proximal phalanx  TECHNIQUE:   The patient was taken to the operating room and received a general anesthetic.  The right leg was prepped and draped in usual sterile fashion.  An elliptical incision was made encompassing the second toe and the dissection carried down to the first metatarsal head.  This was divided with a reciprocating saw.  Hemostasis was obtained in the wound using electrocautery.  The wound was then closed with 3 interrupted 3 oh nylons.  Sterile dressing was applied.  Patient tolerated the procedure well was transferred to recovery in stable condition.  All needle and sponge counts were correct.  [name], MD, FACS [facility]  DATE OF DICTATION:   03/01/2019

## 2019-03-01 NOTE — Progress Notes (Addendum)
Report given by Deneise Lever. Pt stable. VS good. Morning BS 74, pt currently NPO for possible surgery today. Pt ambulates well with his cane. Will continue to monitor.

## 2019-03-01 NOTE — Progress Notes (Signed)
  Progress Note    03/01/2019 8:22 AM * No surgery found *  Subjective:  No new issues  Vitals:   03/01/19 0403 03/01/19 0754  BP: 115/65 123/66  Pulse: 61 63  Resp: 15 17  Temp: 98.1 F (36.7 C) 98.3 F (36.8 C)  SpO2: 94% 94%    Physical Exam: aaox3 Right 2nd toe with gangrenous changes,partial 3rd toe may be involved Palpable right pt pulse   CBC    Component Value Date/Time   WBC 6.8 03/01/2019 0320   RBC 3.40 (L) 03/01/2019 0320   HGB 8.7 (L) 03/01/2019 0320   HGB 12.3 (L) 05/30/2017 1531   HCT 26.8 (L) 03/01/2019 0320   HCT 37.7 05/30/2017 1531   PLT 208 03/01/2019 0320   PLT 212 05/30/2017 1531   MCV 78.8 (L) 03/01/2019 0320   MCV 86 05/30/2017 1531   MCH 25.6 (L) 03/01/2019 0320   MCHC 32.5 03/01/2019 0320   RDW 18.7 (H) 03/01/2019 0320   RDW 16.0 (H) 05/30/2017 1531   LYMPHSABS 1.0 02/28/2019 1807   MONOABS 1.6 (H) 02/28/2019 1807   EOSABS 0.1 02/28/2019 1807   BASOSABS 0.0 02/28/2019 1807    BMET    Component Value Date/Time   NA 137 03/01/2019 0320   NA 139 05/30/2017 1531   K 3.4 (L) 03/01/2019 0320   CL 101 03/01/2019 0320   CO2 26 03/01/2019 0320   GLUCOSE 107 (H) 03/01/2019 0320   BUN 21 03/01/2019 0320   BUN 13 05/30/2017 1531   CREATININE 1.17 03/01/2019 0320   CREATININE 1.00 01/04/2018 1401   CALCIUM 8.6 (L) 03/01/2019 0320   GFRNONAA 57 (L) 03/01/2019 0320   GFRNONAA >60 01/04/2018 1401   GFRAA >60 03/01/2019 0320   GFRAA >60 01/04/2018 1401    INR    Component Value Date/Time   INR 1.07 12/13/2017 1623     Intake/Output Summary (Last 24 hours) at 03/01/2019 [id number] Last data filed at 03/01/2019 0316 Gross per 24 hour  Intake 293.4 ml  Output 350 ml  Net -56.6 ml     Assessment/plan:  85 y.o. male is s/p previous revascularization right posterior tibial artery.  Has a palpable pulse.  Has gangrenous changes to his right second toe after first toe amputation that is healed well.  We will plan for amputation of the  second possible third toe today in the OR.   Brylie Sneath C. Donzetta Matters, MD [facility] [place] Office: [phone] Pager: [phone]  03/01/2019 8:22 AM

## 2019-03-01 NOTE — Anesthesia Preprocedure Evaluation (Signed)
Anesthesia Evaluation  Patient identified by MRN, date of birth, ID band Patient awake    Reviewed: Allergy & Precautions, NPO status , Patient's Chart, lab work & pertinent test results  Airway Mallampati: II  TM Distance: >3 FB Neck ROM: Full    Dental no notable dental hx.    Pulmonary sleep apnea , former smoker,    Pulmonary exam normal breath sounds clear to auscultation       Cardiovascular hypertension, + CAD, + Cardiac Stents, + Peripheral Vascular Disease and +CHF  Normal cardiovascular exam Rhythm:Regular Rate:Normal     Neuro/Psych negative neurological ROS  negative psych ROS   GI/Hepatic negative GI ROS, Neg liver ROS,   Endo/Other  diabetesHypothyroidism   Renal/GU   negative genitourinary   Musculoskeletal negative musculoskeletal ROS (+)   Abdominal   Peds negative pediatric ROS (+)  Hematology  (+) anemia ,   Anesthesia Other Findings   Reproductive/Obstetrics negative OB ROS                             Anesthesia Physical Anesthesia Plan  ASA: IV  Anesthesia Plan: General   Post-op Pain Management:    Induction: Intravenous  PONV Risk Score and Plan: 2 and Ondansetron, Dexamethasone and Treatment may vary due to age or medical condition  Airway Management Planned: LMA  Additional Equipment:   Intra-op Plan:   Post-operative Plan: Extubation in OR  Informed Consent: I have reviewed the patients History and Physical, chart, labs and discussed the procedure including the risks, benefits and alternatives for the proposed anesthesia with the patient or authorized representative who has indicated his/her understanding and acceptance.     Dental advisory given  Plan Discussed with: CRNA and Surgeon  Anesthesia Plan Comments:         Anesthesia Quick Evaluation

## 2019-03-02 ENCOUNTER — Other Ambulatory Visit: Payer: Self-pay

## 2019-03-02 ENCOUNTER — Encounter (HOSPITAL_COMMUNITY): Payer: Self-pay | Admitting: Vascular Surgery

## 2019-03-02 DIAGNOSIS — E119 Type 2 diabetes mellitus without complications: Secondary | ICD-10-CM

## 2019-03-02 DIAGNOSIS — Z794 Long term (current) use of insulin: Secondary | ICD-10-CM

## 2019-03-02 DIAGNOSIS — J449 Chronic obstructive pulmonary disease, unspecified: Secondary | ICD-10-CM

## 2019-03-02 LAB — BASIC METABOLIC PANEL
Anion gap: 9 (ref 5–15)
BUN: 21 mg/dL (ref 8–23)
CO2: 25 mmol/L (ref 22–32)
Calcium: 8.6 mg/dL — ABNORMAL LOW (ref 8.9–10.3)
Chloride: 103 mmol/L (ref 98–111)
Creatinine, Ser: 1.08 mg/dL (ref 0.61–1.24)
GFR calc Af Amer: >60 mL/min (ref >60)
GFR calc non Af Amer: >60 mL/min (ref >60)
Glucose, Bld: 222 mg/dL — ABNORMAL HIGH (ref 70–99)
Potassium: 4.7 mmol/L (ref 3.5–5.1)
Sodium: 137 mmol/L (ref 135–145)

## 2019-03-02 LAB — CBC
HCT: 27.1 % — ABNORMAL LOW (ref 39.0–52.0)
Hemoglobin: 8.5 g/dL — ABNORMAL LOW (ref 13.0–17.0)
MCH: 25.1 pg — ABNORMAL LOW (ref 26.0–34.0)
MCHC: 31.4 g/dL (ref 30.0–36.0)
MCV: 80.2 fL (ref 80.0–100.0)
Platelets: 213 10*3/uL (ref 150–400)
RBC: 3.38 MIL/uL — ABNORMAL LOW (ref 4.22–5.81)
RDW: 18.8 % — ABNORMAL HIGH (ref 11.5–15.5)
WBC: 6.4 10*3/uL (ref 4.0–10.5)
nRBC: 0 % (ref 0.0–0.2)

## 2019-03-02 LAB — GLUCOSE, CAPILLARY
Glucose-Capillary: 160 mg/dL — ABNORMAL HIGH (ref 70–99)
Glucose-Capillary: 161 mg/dL — ABNORMAL HIGH (ref 70–99)
Glucose-Capillary: 150 mg/dL — ABNORMAL HIGH (ref 70–99)
Glucose-Capillary: 154 mg/dL — ABNORMAL HIGH (ref 70–99)

## 2019-03-02 MED ORDER — LISINOPRIL 5 MG PO TABS: 2.5000 mg | ORAL_TABLET | Freq: Every day | ORAL | Status: DC

## 2019-03-02 MED ORDER — GUAIFENESIN 100 MG/5ML PO SOLN
5.0000 mL | ORAL | Status: DC | PRN
  Administered 2019-03-02: 100 mg via ORAL
  Filled 2019-03-02: qty 5

## 2019-03-02 MED ORDER — APIXABAN 5 MG PO TABS
5.0000 mg | ORAL_TABLET | Freq: Two times a day (BID) | ORAL | Status: DC
  Administered 2019-03-02 – 2019-03-03 (×2): 5 mg via ORAL
  Filled 2019-03-02 (×2): qty 1

## 2019-03-02 NOTE — Evaluation (Signed)
Physical Therapy Evaluation Patient Details Name: Mike Wright MRN: [id number] DOB: Mar 27, 1934 Today's Date: 03/02/2019   History of Present Illness  Pt is an 85 y/o male s/p R second toe amputation secondary to gangrene. PMH including but not limited to a-fib, CHF, COPD, HTN, bilateral great toe amputations in 2019, bilateral TKA and lumbar fusion in 2002.    Clinical Impression  Pt presented supine in bed with HOB elevated, awake and willing to participate in therapy session. Prior to admission, pt reported that he was independent with ADLs and used a cane PRN when ambulating outside of his home. Pt lives with his wife in a single level house with four steps to enter. He will have 24/7 supervision/assistance upon d/c as needed. At the time of evaluation, pt overall moving well. He required min A for transfers and ambulated a short distance within his room with RW and min guard for safety. DARCO shoe was donned to R foot for transfers and ambulation; however, pt demonstrating difficulty maintaining WB'ing through heel only as evident by the shoe tipping forwards during stance phase of gait on R. Plan (per pt) is to d/c home either tomorrow or Monday. He will need stair training prior to d/c. Will plan for that at next session as appropriate. Pt would continue to benefit from skilled physical therapy services at this time while admitted and after d/c to address the below listed limitations in order to improve overall safety and independence with functional mobility.     Follow Up Recommendations Home health PT;Other (comment)(has worked with Truddie Hidden (PT) with [facility] in the past)    Equipment Recommendations  None recommended by PT;Other (comment)(pt has all necessary DME at home)    Recommendations for Other Services       Precautions / Restrictions Precautions Precautions: Fall Required Braces or Orthoses: Other Brace Other Brace: DARCO shoe Restrictions Weight Bearing  Restrictions: Yes RLE Weight Bearing: Partial weight bearing RLE Partial Weight Bearing Percentage or Pounds: through heel only with DARCO shoe      Mobility  Bed Mobility Overal bed mobility: Needs Assistance Bed Mobility: Supine to Sit     Supine to sit: Supervision     General bed mobility comments: for safety  Transfers Overall transfer level: Needs assistance Equipment used: Rolling walker (2 wheeled) Transfers: Sit to/from Stand Sit to Stand: Min assist         General transfer comment: cueing for safe hand placement, min A for stability and to fully power into standing from EOB with bed in lowest position  Ambulation/Gait Ambulation/Gait assistance: Min guard Gait Distance (Feet): 15 Feet Assistive device: Rolling walker (2 wheeled) Gait Pattern/deviations: Step-to pattern;Decreased step length - right;Decreased step length - left;Decreased stance time - right;Decreased stride length;Decreased weight shift to right;Shuffle Gait velocity: decreased   General Gait Details: pt with short shuffling steps bilaterally; difficulty maintaining weight through heel on R side with DARCO shoe donned despite demonstration and cueing  Stairs            Wheelchair Mobility    Modified Rankin (Stroke Patients Only)       Balance Overall balance assessment: Needs assistance Sitting-balance support: Feet supported Sitting balance-Leahy Scale: Good     Standing balance support: Bilateral upper extremity supported;Single extremity supported Standing balance-Leahy Scale: Poor Standing balance comment: reliant on UEs on RW  Pertinent Vitals/Pain Pain Assessment: 0-10 Pain Score: 3  Pain Location: R foot Pain Descriptors / Indicators: Aching;Dull Pain Intervention(s): Monitored during session;Repositioned    Home Living Family/patient expects to be discharged to:: Private residence Living Arrangements: Spouse/significant  other Available Help at Discharge: Family;Available 24 hours/day Type of Home: House Home Access: Stairs to enter Entrance Stairs-Rails: Psychiatric nurse of Steps: 4 Home Layout: One level Home Equipment: Cane - single point;Walker - 2 wheels;Walker - 4 wheels;Wheelchair - manual      Prior Function Level of Independence: Independent with assistive device(s)         Comments: ambulates with a cane when outside of his home, no AD to walk inside the home     Hand Dominance        Extremity/Trunk Assessment   Upper Extremity Assessment Upper Extremity Assessment: Overall WFL for tasks assessed    Lower Extremity Assessment Lower Extremity Assessment: Overall WFL for tasks assessed       Communication   Communication: HOH  Cognition Arousal/Alertness: Awake/alert Behavior During Therapy: WFL for tasks assessed/performed Overall Cognitive Status: Within Functional Limits for tasks assessed                                        General Comments      Exercises     Assessment/Plan    PT Assessment Patient needs continued PT services  PT Problem List Decreased balance;Decreased mobility;Decreased coordination;Decreased safety awareness;Decreased knowledge of use of DME;Decreased knowledge of precautions;Pain       PT Treatment Interventions DME instruction;Gait training;Stair training;Functional mobility training;Therapeutic activities;Therapeutic exercise;Balance training;Neuromuscular re-education;Patient/family education    PT Goals (Current goals can be found in the Care Plan section)  Acute Rehab PT Goals Patient Stated Goal: "to go home" PT Goal Formulation: With patient Time For Goal Achievement: 03/16/19 Potential to Achieve Goals: Good    Frequency Min 3X/week   Barriers to discharge        Co-evaluation               AM-PAC PT "6 Clicks" Mobility  Outcome Measure Help needed turning from your back to  your side while in a flat bed without using bedrails?: None Help needed moving from lying on your back to sitting on the side of a flat bed without using bedrails?: None Help needed moving to and from a bed to a chair (including a wheelchair)?: A Little Help needed standing up from a chair using your arms (e.g., wheelchair or bedside chair)?: A Little Help needed to walk in hospital room?: A Little Help needed climbing 3-5 steps with a railing? : A Little 6 Click Score: 20    End of Session Equipment Utilized During Treatment: Gait belt Activity Tolerance: Patient tolerated treatment well Patient left: in chair;with call bell/phone within reach Nurse Communication: Mobility status PT Visit Diagnosis: Other abnormalities of gait and mobility (R26.89);Pain Pain - Right/Left: Right Pain - part of body: Ankle and joints of foot    Time: 1344-1405 PT Time Calculation (min) (ACUTE ONLY): 21 min   Charges:   PT Evaluation $PT Eval Moderate Complexity: 1 Mod          Eduard Clos, PT, DPT  Acute Rehabilitation Services Pager [phone] Office [place] 03/02/2019, 2:23 PM

## 2019-03-02 NOTE — Progress Notes (Addendum)
Pt stable. Pain managed with one dose of fentanyl and one dose of tylenol. Pt c/o coughing during the night, robitussin was given and relief occurred. Will continue to monitor. Pt stated he hopes to get discharged before it gets dark because is wife is picking him up. Pt anemic, current Hgb 8.5

## 2019-03-02 NOTE — Progress Notes (Signed)
Report received from [place], [place]

## 2019-03-02 NOTE — Progress Notes (Signed)
PROGRESS NOTE    Mike Wright  [id number] DOB: Jan 14, 1934 DOA: 02/28/2019 PCP: Monico Blitz, MD   Brief Narrative:   Mike Naz Butleris a 85 y.o.M,  PMh/o PVD, with multiple previous irritations of the toes especially the 2 great toes, DM II, HTN, CAD HLD, Anemia of chronic disease, dCHF GERD who presents to the ER with right second toe infection which has gradually worsened over the last 2 weeks.  It started as usual diabetic foot infection he was seen by his PCP and sent back to vascular surgery already done previous amputation. The patient has not seen improvement despite trial of some antibiotics.  Denies any fever or chills denied any nausea but no diarrhea. Patient has noted gradual worsening. No injury. He was admitted with infection and gangrene of the right second toe.  ED Course:Vitals are stable, chemistry appears to be stable and within normal result. CBC also looks good except for hemoglobin 9.1. Chest x-ray showed no acute finding x-ray of the right foot showed gas and MD second metatarsal possibly indicating infection.   Hospital Course: Amputation of right second toe performed 12/4 due to gangrene infection.    Assessment & Plan:   Principal Problem:   Gangrene of toe of right foot ([place]) Active Problems:   Essential hypertension   Acute on chronic diastolic CHF (congestive heart failure) (HCC)   Sleep apnea   Type 2 diabetes mellitus (HCC)   COPD (chronic obstructive pulmonary disease) (HCC)   Paroxysmal atrial flutter (HCC)   PAD (peripheral artery disease) (HCC)   Gangrene of the right second toe/in the setting of nonhealing diabetic foot wound, s/p amputation  -Remained stable, afebrile normotensive -Continue aggressive IV antibiotic treatment with Zosyn -Follow-up with Blood/wound cultures >>do not see that these were collected. Will draw blood cultures now although utility is lessened due to patient being on broad spectrum antibiotics for  almost 24h.  -PT consulted  -begin daily dressing changes    Essential hypertension: BPs slightly elevated.  -As needed IV hydralazine, PO clonidine -restart Ace inhibitor, renal fxn stable Continue blood pressure control.  Diabetes: -Continue home Lantus and SSI insulin    COPD:  Stable no exacerbation   Peripheral vascular disease: -Resume Eliquis 24h post-op as this was a low bleeding risk procedure  Continue home regimen and follow guidelines vascular surgery. Require repeat vascular studies.  History of A-fib/CAD/ dCHF -Monitoring daily weights, I's and O's -On Lasix and Lisinopril  -Continue Imdur, not on beta-blocker -Last Echo: 09/14/2017, ejection fraction 55-60%, moderate LVH, -resume Eliquis tonight   History of chronic anemia -Monitoring H&H closely  Hyperlipidemia -Not on any statins, checking lipid panel     DVT prophylaxis: Restart Eliquis tonight  Code Status: DNR  Family Communication:   Disposition Plan: Pending PT consult   Consultants:   Vascular Surgery   Procedures:   Amputation of Right Second Toe  Antimicrobials:   Zosyn    Subjective: Patient feeling well this morning. Tolerating PO intake. Reports right foot feels fine, numb at baseline. Ambulated to restroom yesterday but then had bleeding at foot. Has been moving around bed, sitting on side.    Review of Systems Otherwise negative except as per HPI, including: General: Denies fever, chills, night sweats or unintended weight loss. Resp: Denies cough, wheezing, shortness of breath. Cardiac: Denies chest pain, palpitations, orthopnea, paroxysmal nocturnal dyspnea. GI: Denies abdominal pain, nausea, vomiting, diarrhea or constipation GU: Denies dysuria, frequency, hesitancy or incontinence MS: Denies muscle aches, joint pain  or swelling Neuro: Denies headache, neurologic deficits (focal weakness, numbness, tingling), abnormal gait Psych: Denies anxiety, depression,  SI/HI/AVH Skin: Denies new rashes or lesions ID: Denies sick contacts, exotic exposures, travel  Objective: Vitals:   03/01/19 1933 03/02/19 0005 03/02/19 0350 03/02/19 0735  BP: 123/70 131/64 (!) 149/93 (!) 152/80  Pulse: 92 (!) 56 61 (!) 54  Resp:    16  Temp: 97.6 F (36.4 C) 97.7 F (36.5 C) (!) 97.4 F (36.3 C)   TempSrc: Oral Oral Oral   SpO2: 97% 93% 95% 95%  Weight:      Height:        Intake/Output Summary (Last 24 hours) at 03/02/2019 0744 Last data filed at 03/02/2019 0400 Gross per 24 hour  Intake 820.7 ml  Output 910 ml  Net -89.3 ml   Filed Weights   02/28/19 1610  Weight: 84.4 kg    Examination:  General exam: Appears calm and comfortable  Respiratory system: Clear to auscultation. Respiratory effort normal. Cardiovascular system: S1 & S2 heard, RRR. No JVD, murmurs, rubs, gallops or clicks. No pedal edema. Gastrointestinal system: Abdomen is nondistended, soft and nontender. No organomegaly or masses felt. Normal bowel sounds heard. Central nervous system: Alert and oriented. No focal neurological deficits. Extremities: Right foot is wrapped.  Skin: No rashes, lesions or ulcers Psychiatry: Judgement and insight appear normal. Mood & affect appropriate.     Data Reviewed:   CBC: Recent Labs  Lab 02/28/19 1807 03/01/19 0320 03/02/19 0355  WBC 8.2 6.8 6.4  NEUTROABS 5.3  --   --   HGB 9.1* 8.7* 8.5*  HCT 28.9* 26.8* 27.1*  MCV 80.3 78.8* 80.2  PLT 225 [id number]   Basic Metabolic Panel: Recent Labs  Lab 02/28/19 1807 03/01/19 0320 03/02/19 0355  NA 138 137 137  K 3.8 3.4* 4.7  CL 101 101 103  CO2 25 26 25   GLUCOSE 103* 107* 222*  BUN 19 21 21   CREATININE 1.22 1.17 1.08  CALCIUM 9.0 8.6* 8.6*   GFR: Estimated Creatinine Clearance: 53.8 mL/min (by C-G formula based on SCr of 1.08 mg/dL). Liver Function Tests: Recent Labs  Lab 03/01/19 0320  AST 23  ALT 20  ALKPHOS 77  BILITOT 0.8  PROT 6.1*  ALBUMIN 3.0*   No results for  input(s): LIPASE, AMYLASE in the last 168 hours. No results for input(s): AMMONIA in the last 168 hours. Coagulation Profile: No results for input(s): INR, PROTIME in the last 168 hours. Cardiac Enzymes: No results for input(s): CKTOTAL, CKMB, CKMBINDEX, TROPONINI in the last 168 hours. BNP (last 3 results) No results for input(s): PROBNP in the last 8760 hours. HbA1C: Recent Labs    03/01/19 0320  HGBA1C 7.1*   CBG: Recent Labs  Lab 03/01/19 1035 03/01/19 1238 03/01/19 1549 03/01/19 2032 03/02/19 0639  GLUCAP 82 81 252* 219* 160*   Lipid Profile: Recent Labs    03/01/19 0320  CHOL 68  HDL 16*  LDLCALC 29  TRIG 114  CHOLHDL 4.3   Thyroid Function Tests: No results for input(s): TSH, T4TOTAL, FREET4, T3FREE, THYROIDAB in the last 72 hours. Anemia Panel: No results for input(s): VITAMINB12, FOLATE, FERRITIN, TIBC, IRON, RETICCTPCT in the last 72 hours. Sepsis Labs: No results for input(s): PROCALCITON, LATICACIDVEN in the last 168 hours.  Recent Results (from the past 240 hour(s))  SARS CORONAVIRUS 2 (TAT 6-24 HRS) Nasopharyngeal Nasopharyngeal Swab     Status: None   Collection Time: 02/28/19  5:01 PM  Specimen: Nasopharyngeal Swab  Result Value Ref Range Status   SARS Coronavirus 2 NEGATIVE NEGATIVE Final    Comment: (NOTE) SARS-CoV-2 target nucleic acids are NOT DETECTED. The SARS-CoV-2 RNA is generally detectable in upper and lower respiratory specimens during the acute phase of infection. Negative results do not preclude SARS-CoV-2 infection, do not rule out co-infections with other pathogens, and should not be used as the sole basis for treatment or other patient management decisions. Negative results must be combined with clinical observations, patient history, and epidemiological information. The expected result is Negative. Fact Sheet for Patients: SugarRoll.be Fact Sheet for Healthcare Providers:  https://www.woods-mathews.com/ This test is not yet approved or cleared by the [place] FDA and  has been authorized for detection and/or diagnosis of SARS-CoV-2 by FDA under an Emergency Use Authorization (EUA). This EUA will remain  in effect (meaning this test can be used) for the duration of the COVID-19 declaration under Section 56 4(b)(1) of the Act, 21 U.S.C. section 360bbb-3(b)(1), unless the authorization is terminated or revoked sooner. Performed at [facility], [place] [address] [place], [place] [address]   MRSA PCR Screening     Status: None   Collection Time: 02/28/19 11:14 PM   Specimen: Nasal Mucosa; Nasopharyngeal  Result Value Ref Range Status   MRSA by PCR NEGATIVE NEGATIVE Final    Comment:        The GeneXpert MRSA Assay (FDA approved for NASAL specimens only), is one component of a comprehensive MRSA colonization surveillance program. It is not intended to diagnose MRSA infection nor to guide or monitor treatment for MRSA infections. Performed at [facility], [place] [address], [place], [place] [address]          Radiology Studies: Dg Chest Port 1 View  Result Date: 02/28/2019 CLINICAL DATA:  Weakness EXAM: PORTABLE CHEST 1 VIEW COMPARISON:  11/26/2018 FINDINGS: Streaky atelectasis at the bases. Low lung volumes. Stable enlargement of the cardiomediastinal silhouette. Aortic atherosclerosis. No pneumothorax. IMPRESSION: No active disease. Low lung volumes and mild cardiomegaly. Mild basilar atelectasis. Electronically Signed   By: Donavan Foil M.D.   On: 02/28/2019 17:58   Dg Foot 2 Views Right  Result Date: 02/28/2019 CLINICAL DATA:  Weakness and foot infection EXAM: RIGHT FOOT - 2 VIEW COMPARISON:  09/13/2017 FINDINGS: Interval partial amputation of the first digit. Chronic appearing deformity at the residual metatarsal. No acute fracture or malalignment. No acute bone destruction evident. Questionable small amount of gas  within the soft tissues medial side of the foot. Plantar calcaneal spur and vascular calcifications. IMPRESSION: Interval partial amputation of first digit with chronic appearing deformity at the residual metatarsal. No definite acute osseous abnormality. Questionable small amount of soft tissue gas medial side of the foot at the level of the MTP joint. Electronically Signed   By: Donavan Foil M.D.   On: 02/28/2019 18:01        Scheduled Meds: . cholecalciferol  1,000 Units Oral Daily  . folic acid  1 mg Oral Daily  . furosemide  40 mg Oral BID  . insulin aspart  0-5 Units Subcutaneous QHS  . insulin aspart  0-9 Units Subcutaneous TID WC  . insulin glargine  40 Units Subcutaneous Daily   And  . insulin glargine  30 Units Subcutaneous QHS  . isosorbide mononitrate  15 mg Oral Daily  . lisinopril  2.5 mg Oral Daily  . multivitamin with minerals  1 tablet Oral Daily  . pantoprazole  40  mg Oral Daily  . potassium chloride SA  20 mEq Oral Daily  . psyllium  1 packet Oral BID WC  . traZODone  100-200 mg Oral QHS  . vitamin B-12  1,000 mcg Oral Daily   Continuous Infusions: . sodium chloride 75 mL/hr at 03/01/19 0833  . piperacillin-tazobactam (ZOSYN)  IV 3.375 g (03/02/19 0507)     LOS: 2 days   Time spent= 25 mins    Melina Schools, DO [facility]  If 7PM-7AM, please contact night-coverage  03/02/2019, 7:44 AM

## 2019-03-02 NOTE — Plan of Care (Signed)

## 2019-03-02 NOTE — Progress Notes (Signed)
   VASCULAR SURGERY ASSESSMENT & PLAN:   POD 1 - RIGHT 2nd TOE AMP: His dressing is dry.  Begin daily dressing changes.  PHYSICAL THERAPY: Physical therapy has been consulted.  Partial weightbearing right foot heel only using Darco shoe.   SUBJECTIVE:   Sleeping comfortably so I did not wake him up.  PHYSICAL EXAM:   Vitals:   03/01/19 1254 03/01/19 1933 03/02/19 0005 03/02/19 0350  BP: (!) 134/96 123/70 131/64 (!) 149/93  Pulse: 64 92 (!) 56 61  Resp: 12     Temp: (!) 97.1 F (36.2 C) 97.6 F (36.4 C) 97.7 F (36.5 C) (!) 97.4 F (36.3 C)  TempSrc:  Oral Oral Oral  SpO2: 92% 97% 93% 95%  Weight:      Height:       Dressing on the right foot is dry.  LABS:   Lab Results  Component Value Date   WBC 6.4 03/02/2019   HGB 8.5 (L) 03/02/2019   HCT 27.1 (L) 03/02/2019   MCV 80.2 03/02/2019   PLT 213 03/02/2019   Lab Results  Component Value Date   CREATININE 1.08 03/02/2019   Lab Results  Component Value Date   INR 1.07 12/13/2017   CBG (last 3)  Recent Labs    03/01/19 1549 03/01/19 2032 03/02/19 0639  GLUCAP 252* 219* 160*    PROBLEM LIST:    Principal Problem:   Gangrene of toe of right foot (HCC) Active Problems:   Essential hypertension   Acute on chronic diastolic CHF (congestive heart failure) (HCC)   Sleep apnea   Type 2 diabetes mellitus (HCC)   COPD (chronic obstructive pulmonary disease) (HCC)   Paroxysmal atrial flutter (HCC)   PAD (peripheral artery disease) (HCC)   CURRENT MEDS:   . cholecalciferol  1,000 Units Oral Daily  . folic acid  1 mg Oral Daily  . furosemide  40 mg Oral BID  . insulin aspart  0-5 Units Subcutaneous QHS  . insulin aspart  0-9 Units Subcutaneous TID WC  . insulin glargine  40 Units Subcutaneous Daily   And  . insulin glargine  30 Units Subcutaneous QHS  . isosorbide mononitrate  15 mg Oral Daily  . lisinopril  2.5 mg Oral Daily  . multivitamin with minerals  1 tablet Oral Daily  . pantoprazole  40 mg  Oral Daily  . potassium chloride SA  20 mEq Oral Daily  . psyllium  1 packet Oral BID WC  . traZODone  100-200 mg Oral QHS  . vitamin B-12  1,000 mcg Oral Daily    [name] Office: [phone] 03/02/2019

## 2019-03-03 DIAGNOSIS — I5032 Chronic diastolic (congestive) heart failure: Secondary | ICD-10-CM | POA: Diagnosis not present

## 2019-03-03 DIAGNOSIS — I96 Gangrene, not elsewhere classified: Secondary | ICD-10-CM

## 2019-03-03 DIAGNOSIS — I4892 Unspecified atrial flutter: Secondary | ICD-10-CM

## 2019-03-03 DIAGNOSIS — I70261 Atherosclerosis of native arteries of extremities with gangrene, right leg: Secondary | ICD-10-CM | POA: Diagnosis not present

## 2019-03-03 DIAGNOSIS — E1165 Type 2 diabetes mellitus with hyperglycemia: Secondary | ICD-10-CM | POA: Diagnosis not present

## 2019-03-03 DIAGNOSIS — G4730 Sleep apnea, unspecified: Secondary | ICD-10-CM

## 2019-03-03 DIAGNOSIS — I5033 Acute on chronic diastolic (congestive) heart failure: Secondary | ICD-10-CM

## 2019-03-03 DIAGNOSIS — E11628 Type 2 diabetes mellitus with other skin complications: Secondary | ICD-10-CM

## 2019-03-03 DIAGNOSIS — J431 Panlobular emphysema: Secondary | ICD-10-CM | POA: Diagnosis not present

## 2019-03-03 DIAGNOSIS — E118 Type 2 diabetes mellitus with unspecified complications: Secondary | ICD-10-CM

## 2019-03-03 DIAGNOSIS — I10 Essential (primary) hypertension: Secondary | ICD-10-CM

## 2019-03-03 DIAGNOSIS — E1152 Type 2 diabetes mellitus with diabetic peripheral angiopathy with gangrene: Secondary | ICD-10-CM | POA: Diagnosis not present

## 2019-03-03 DIAGNOSIS — L089 Local infection of the skin and subcutaneous tissue, unspecified: Secondary | ICD-10-CM

## 2019-03-03 DIAGNOSIS — L97518 Non-pressure chronic ulcer of other part of right foot with other specified severity: Secondary | ICD-10-CM | POA: Diagnosis not present

## 2019-03-03 DIAGNOSIS — I739 Peripheral vascular disease, unspecified: Secondary | ICD-10-CM

## 2019-03-03 DIAGNOSIS — J449 Chronic obstructive pulmonary disease, unspecified: Secondary | ICD-10-CM | POA: Diagnosis not present

## 2019-03-03 DIAGNOSIS — E11621 Type 2 diabetes mellitus with foot ulcer: Secondary | ICD-10-CM | POA: Diagnosis not present

## 2019-03-03 DIAGNOSIS — I4891 Unspecified atrial fibrillation: Secondary | ICD-10-CM | POA: Diagnosis not present

## 2019-03-03 LAB — CBC
HCT: 27.2 % — ABNORMAL LOW (ref 39.0–52.0)
Hemoglobin: 8.6 g/dL — ABNORMAL LOW (ref 13.0–17.0)
MCH: 25.5 pg — ABNORMAL LOW (ref 26.0–34.0)
MCHC: 31.6 g/dL (ref 30.0–36.0)
MCV: 80.7 fL (ref 80.0–100.0)
Platelets: 237 10*3/uL (ref 150–400)
RBC: 3.37 MIL/uL — ABNORMAL LOW (ref 4.22–5.81)
RDW: 18.8 % — ABNORMAL HIGH (ref 11.5–15.5)
WBC: 6.1 10*3/uL (ref 4.0–10.5)
nRBC: 0 % (ref 0.0–0.2)

## 2019-03-03 LAB — BASIC METABOLIC PANEL
Anion gap: 9 (ref 5–15)
BUN: 23 mg/dL (ref 8–23)
CO2: 26 mmol/L (ref 22–32)
Calcium: 8.5 mg/dL — ABNORMAL LOW (ref 8.9–10.3)
Chloride: 103 mmol/L (ref 98–111)
Creatinine, Ser: 1.21 mg/dL (ref 0.61–1.24)
GFR calc Af Amer: >60 mL/min (ref >60)
GFR calc non Af Amer: 55 mL/min — ABNORMAL LOW (ref >60)
Glucose, Bld: 73 mg/dL (ref 70–99)
Potassium: 3.7 mmol/L (ref 3.5–5.1)
Sodium: 138 mmol/L (ref 135–145)

## 2019-03-03 LAB — GLUCOSE, CAPILLARY
Glucose-Capillary: 65 mg/dL — ABNORMAL LOW (ref 70–99)
Glucose-Capillary: 90 mg/dL (ref 70–99)
Glucose-Capillary: 166 mg/dL — ABNORMAL HIGH (ref 70–99)

## 2019-03-03 MED ORDER — ATORVASTATIN CALCIUM 20 MG PO TABS: 20.0000 mg | ORAL_TABLET | Freq: Every day | ORAL | 0 refills | Status: DC

## 2019-03-03 MED ORDER — AMOXICILLIN-POT CLAVULANATE 875-125 MG PO TABS: 1.0000 | ORAL_TABLET | Freq: Two times a day (BID) | ORAL | 0 refills | Status: DC

## 2019-03-03 MED ORDER — ACETAMINOPHEN 500 MG PO TABS: 500.0000 mg | ORAL_TABLET | Freq: Four times a day (QID) | ORAL | 0 refills | Status: AC | PRN

## 2019-03-03 MED ORDER — AMOXICILLIN-POT CLAVULANATE 875-125 MG PO TABS: 1.0000 | ORAL_TABLET | Freq: Two times a day (BID) | ORAL | Status: DC

## 2019-03-03 MED ORDER — ATORVASTATIN CALCIUM 10 MG PO TABS: 20.0000 mg | ORAL_TABLET | Freq: Every day | ORAL | Status: DC

## 2019-03-03 MED ORDER — ONDANSETRON HCL 4 MG PO TABS: 4.0000 mg | ORAL_TABLET | Freq: Four times a day (QID) | ORAL | 0 refills | Status: DC | PRN

## 2019-03-03 MED ORDER — IBUPROFEN 600 MG PO TABS: 600.0000 mg | ORAL_TABLET | Freq: Four times a day (QID) | ORAL | 0 refills | Status: DC | PRN

## 2019-03-03 MED ORDER — DILAUDID 2 MG PO TABS: 1.0000 mg | ORAL_TABLET | Freq: Two times a day (BID) | ORAL | 0 refills | Status: AC | PRN

## 2019-03-03 NOTE — Progress Notes (Signed)
   VASCULAR SURGERY ASSESSMENT & PLAN:   POD 2  - RIGHT 2nd TOE AMP: I inspected the wound this morning and this is healing well.  There is some mild erythema.  PHYSICAL THERAPY: He is walking with her Darco shoe.  ID: He is on intravenous Zosyn.  He tells me that they "found something in his blood."  However the only cultures I can see are 2 - blood cultures from 03/02/2019.  From a vascular standpoint he could be discharged on po antibiotics and Dr. Trula Slade could follow the toe amputation site as an outpatient.  SUBJECTIVE:   No complaints this morning.  PHYSICAL EXAM:   Vitals:   03/02/19 1407 03/02/19 1925 03/03/19 0518 03/03/19 0754  BP: (!) 116/57 104/64 (!) 153/70 133/68  Pulse: 64 (!) 54 63 (!) 58  Resp: 15  17 16   Temp: (!) 97.2 F (36.2 C) 97.9 F (36.6 C) 97.6 F (36.4 C) (!) 97.5 F (36.4 C)  TempSrc: Oral Oral Oral Oral  SpO2: 98% 100% 95% 94%  Weight:      Height:       I changed his dressing in his right second toe amputation site is healing well.  There is only some mild erythema.  LABS:   Lab Results  Component Value Date   WBC 6.1 03/03/2019   HGB 8.6 (L) 03/03/2019   HCT 27.2 (L) 03/03/2019   MCV 80.7 03/03/2019   PLT 237 03/03/2019   CBG (last 3)  Recent Labs    03/02/19 2106 03/03/19 0639 03/03/19 0713  GLUCAP 154* 65* 90   PROBLEM LIST:    Principal Problem:   Gangrene of toe of right foot (HCC) Active Problems:   Essential hypertension   Acute on chronic diastolic CHF (congestive heart failure) (HCC)   Sleep apnea   Type 2 diabetes mellitus (HCC)   COPD (chronic obstructive pulmonary disease) (HCC)   Paroxysmal atrial flutter (HCC)   PAD (peripheral artery disease) (HCC)   CURRENT MEDS:   . apixaban  5 mg Oral BID  . cholecalciferol  1,000 Units Oral Daily  . folic acid  1 mg Oral Daily  . furosemide  40 mg Oral BID  . insulin aspart  0-5 Units Subcutaneous QHS  . insulin aspart  0-9 Units Subcutaneous TID WC  . insulin  glargine  40 Units Subcutaneous Daily   And  . insulin glargine  30 Units Subcutaneous QHS  . isosorbide mononitrate  15 mg Oral Daily  . lisinopril  2.5 mg Oral Daily  . multivitamin with minerals  1 tablet Oral Daily  . pantoprazole  40 mg Oral Daily  . potassium chloride SA  20 mEq Oral Daily  . psyllium  1 packet Oral BID WC  . traZODone  100-200 mg Oral QHS  . vitamin B-12  1,000 mcg Oral Daily    [name] Office: [phone] 03/03/2019

## 2019-03-03 NOTE — Plan of Care (Signed)

## 2019-03-03 NOTE — Discharge Summary (Signed)
Physician Discharge Summary  Mike Wright [id number] DOB: September 07, 1933 DOA: 02/28/2019  PCP: Monico Blitz, MD  Admit date: 02/28/2019 Discharge date: 03/03/2019  Time spent: 30 minutes  Recommendations for Outpatient Follow-up:   Gangrene RIGHT second toe in the setting of nonhealing diabetic foot wound -S/p RIGHT second toe on 12/4 -Blood cultures NGTD see below -Per vascular surgery patient can be discharged on p.o. antibiotic.  Augmentin 875-125 mg BID x14 days or until stopped by vascular surgery. -Home health, PT  -Daily dressing change -Patient to follow-up with Dr. Trula Slade in 2 weeks  Atrial fibrillation -Currently rate controlled -Eliquis 5 mg BID -See essential HTN -Follow-up with cardiology Dr. Carlyle Dolly in 1 to 2 weeks chronic diastolic CHF, HTN, atrial fibrillation  Chronic diastolic CHF -Last echocardiogram 09/14/2017 EF 55 to 60%, moderate LVH -See essential HTN -Follow-up with cardiologist in 2 to 4 weeks  Essential HTN -Lasix 40 mg  BID  -Imdur 15 mg daily -Lisinopril 2.5 mg daily  Diabetes type 2 uncontrolled with complication -[id number] hemoglobin A1c= 7.1 -Home insulin regimen -Schedule follow-up with PCP in 1 week diabetes type 2 uncontrolled with complication; s/p RIGHT second toe amputation  PVD -Eliquis -See gangrene RIGHT toe  COPD -Home regimen   History of chronic anemia Recent Labs  Lab 02/28/19 1807 03/01/19 0320 03/02/19 0355 03/03/19 0430  HGB 9.1* 8.7* 8.5* 8.6*  -Stable  Hyperlipidemia -12/4 LDL= 29 -Lipitor 20 mg daily     Discharge Diagnoses:  Principal Problem:   Gangrene of toe of right foot (Mike Wright) Active Problems:   Essential hypertension   Acute on chronic diastolic CHF (congestive heart failure) (HCC)   Sleep apnea   Type 2 diabetes mellitus (HCC)   COPD (chronic obstructive pulmonary disease) (HCC)   Paroxysmal atrial flutter (HCC)   PAD (peripheral artery disease) ([place])   Discharge Condition:  Stable  Diet recommendation: Carb modified  Filed Weights   02/28/19 1610  Weight: 84.4 kg    History of present illness:  85 y.o.WM, PMHx  PVD, with multiple previous irritations of the toes especially the 2 great toes, diabetes type 2 uncontrolled with complication,chronic diastolic CHF, HTN, CAD HLD, Anemia of chronic disease,  GERD  Presents to the ER with right second toe infection which has gradually worsened over the last 2 weeks.  It started as usual diabetic foot infection he was seen by his PCP and sent back to vascular surgery already done previous amputation. The patient has notseen improvementdespite trial of some antibiotics.  Denies any fever or chills denied any nausea but no diarrhea. Patient has noted gradual worsening. No injury. Hewasadmitted with infection and gangrene of the right second toe.  ED Course:Vitals are stable, chemistry appears to be stable and within normal result. CBC also looks good except for hemoglobin 9.1. Chest x-ray showed no acute finding x-ray of the right foot showed gas and MD second metatarsal possibly indicating infection.  Hospital Course: Amputation of right second toe performed 12/4 due to gangrene infection.   Procedures: 12/4 amputation RIGHT second toe    Consultations: Vascular surgery    Cultures  12/5 blood LEFT antecubital NGTD 12/5 blood LEFT hand NGTD   Antibiotics Anti-infectives (From admission, onward)   Start     Stop   03/03/19 1400  amoxicillin-clavulanate (AUGMENTIN) 875-125 MG per tablet 1 tablet         03/03/19 0000  amoxicillin-clavulanate (AUGMENTIN) 875-125 MG tablet         03/01/19 0200  piperacillin-tazobactam (  ZOSYN) IVPB 3.375 g  Status:  Discontinued     03/03/19 1157   02/28/19 1700  piperacillin-tazobactam (ZOSYN) IVPB 3.375 g     02/28/19 1842       Discharge Exam: Vitals:   03/02/19 1407 03/02/19 1925 03/03/19 0518 03/03/19 0754  BP: (!) 116/57 104/64 (!)  153/70 133/68  Pulse: 64 (!) 54 63 (!) 58  Resp: 15  17 16   Temp: (!) 97.2 F (36.2 C) 97.9 F (36.6 C) 97.6 F (36.4 C) (!) 97.5 F (36.4 C)  TempSrc: Oral Oral Oral Oral  SpO2: 98% 100% 95% 94%  Weight:      Height:        General: A/O x4, Cardiovascular: Regular rhythm and rate, negative murmurs rubs or gallops, normal S1/S2 Respiratory: Clear to auscultation bilateral negative wheezes or crackles Extremities; RIGHT foot nontender to palpation, second RIGHT toe amputated, dressing clean and covered with Ace bandage.  Darco shoe in place.  Discharge Instructions   Allergies as of 03/03/2019      Reactions   Bactrim [sulfamethoxazole-trimethoprim]    Pancytopenia   Codeine Shortness Of Breath   Heparin Other (See Comments)   +HIT,  Severe bleeding (with heparin drip & large doses)   Losartan Swelling   Per ENT UNSPECIFIED REACTION    Other Other (See Comments)   Severe bleeding UNSPECIFIED AGENT    Oxycodone Other (See Comments)   "Made me act out of my mind" Other reaction(s): Other (See Comments) Mental status changes hallucinations      Medication List    TAKE these medications   acetaminophen 500 MG tablet Commonly known as: TYLENOL Take 1 tablet (500 mg total) by mouth every 6 (six) hours as needed for mild pain or headache. What changed: when to take this   amoxicillin-clavulanate 875-125 MG tablet Commonly known as: AUGMENTIN Take 1 tablet by mouth every 12 (twelve) hours.   atorvastatin 20 MG tablet Commonly known as: LIPITOR Take 1 tablet (20 mg total) by mouth daily at 6 PM.   B-12 PO Take 1 tablet by mouth daily.   D3-1000 PO Take 1,000 Units by mouth daily.   Dilaudid 2 MG tablet Generic drug: HYDROmorphone Take 0.5 tablets (1 mg total) by mouth every 12 (twelve) hours as needed for up to 10 days for severe pain.   Eliquis 5 MG Tabs tablet Generic drug: apixaban TAKE 1 TABLET TWICE DAILY What changed: how much to take   Flintstones  Complete 18 MG Chew Chew 1 tablet by mouth 2 (two) times daily.   folic acid 1 MG tablet Commonly known as: FOLVITE Take 1 tablet (1 mg total) by mouth daily.   furosemide 40 MG tablet Commonly known as: LASIX TAKE 1 TABLET TWICE DAILY  (DOSE  CHANGE) What changed: See the new instructions.   ibuprofen 600 MG tablet Commonly known as: ADVIL Take 1 tablet (600 mg total) by mouth every 6 (six) hours as needed for moderate pain.   insulin glargine 100 UNIT/ML injection Commonly known as: LANTUS Inject 30-40 Units into the skin See admin instructions. Inject 40 units SQ in the morning and inject 30 units SQ at bedtime   isosorbide mononitrate 30 MG 24 hr tablet Commonly known as: IMDUR Take 0.5 tablets (15 mg total) by mouth daily.   lisinopril 5 MG tablet Commonly known as: ZESTRIL Take 0.5 tablets (2.5 mg total) by mouth daily.   meclizine 12.5 MG tablet Commonly known as: ANTIVERT Take 1 tablet (12.5 mg  total) by mouth 3 (three) times daily as needed for dizziness.   metFORMIN 1000 MG tablet Commonly known as: GLUCOPHAGE Take 1,000 mg by mouth 2 (two) times daily.   nitroGLYCERIN 0.4 MG SL tablet Commonly known as: NITROSTAT PLACE 1 TABLET (0.4 MG TOTAL) UNDER THE TONGUE EVERY 5  MINUTES AS NEEDED FOR CHEST PAIN. What changed: See the new instructions.   ondansetron 4 MG tablet Commonly known as: ZOFRAN Take 1 tablet (4 mg total) by mouth every 6 (six) hours as needed for nausea.   pantoprazole 40 MG tablet Commonly known as: Protonix Take 1 tablet (40 mg total) by mouth 2 (two) times daily. To protect stomach while taking multiple blood thinners. What changed:   when to take this  additional instructions   potassium chloride SA 20 MEQ tablet Commonly known as: KLOR-CON Take 1 tablet (20 mEq total) by mouth daily.   psyllium 58.6 % packet Commonly known as: METAMUCIL Take 1 packet by mouth 2 (two) times daily.   traZODone 100 MG tablet Commonly known as:  DESYREL Take 100-200 mg by mouth at bedtime.      Allergies  Allergen Reactions  . Bactrim [Sulfamethoxazole-Trimethoprim]     Pancytopenia  . Codeine Shortness Of Breath  . Heparin Other (See Comments)    +HIT,  Severe bleeding (with heparin drip & large doses)  . Losartan Swelling    Per ENT UNSPECIFIED REACTION   . Other Other (See Comments)    Severe bleeding UNSPECIFIED AGENT   . Oxycodone Other (See Comments)    "Made me act out of my mind" Other reaction(s): Other (See Comments) Mental status changes hallucinations      The results of significant diagnostics from this hospitalization (including imaging, microbiology, ancillary and laboratory) are listed below for reference.    Significant Diagnostic Studies: Dg Chest Port 1 View  Result Date: 02/28/2019 CLINICAL DATA:  Weakness EXAM: PORTABLE CHEST 1 VIEW COMPARISON:  11/26/2018 FINDINGS: Streaky atelectasis at the bases. Low lung volumes. Stable enlargement of the cardiomediastinal silhouette. Aortic atherosclerosis. No pneumothorax. IMPRESSION: No active disease. Low lung volumes and mild cardiomegaly. Mild basilar atelectasis. Electronically Signed   By: Donavan Foil M.D.   On: 02/28/2019 17:58   Dg Foot 2 Views Right  Result Date: 02/28/2019 CLINICAL DATA:  Weakness and foot infection EXAM: RIGHT FOOT - 2 VIEW COMPARISON:  09/13/2017 FINDINGS: Interval partial amputation of the first digit. Chronic appearing deformity at the residual metatarsal. No acute fracture or malalignment. No acute bone destruction evident. Questionable small amount of gas within the soft tissues medial side of the foot. Plantar calcaneal spur and vascular calcifications. IMPRESSION: Interval partial amputation of first digit with chronic appearing deformity at the residual metatarsal. No definite acute osseous abnormality. Questionable small amount of soft tissue gas medial side of the foot at the level of the MTP joint. Electronically Signed    By: Donavan Foil M.D.   On: 02/28/2019 18:01    Microbiology: Recent Results (from the past 240 hour(s))  SARS CORONAVIRUS 2 (TAT 6-24 HRS) Nasopharyngeal Nasopharyngeal Swab     Status: None   Collection Time: 02/28/19  5:01 PM   Specimen: Nasopharyngeal Swab  Result Value Ref Range Status   SARS Coronavirus 2 NEGATIVE NEGATIVE Final    Comment: (NOTE) SARS-CoV-2 target nucleic acids are NOT DETECTED. The SARS-CoV-2 RNA is generally detectable in upper and lower respiratory specimens during the acute phase of infection. Negative results do not preclude SARS-CoV-2 infection, do not  rule out co-infections with other pathogens, and should not be used as the sole basis for treatment or other patient management decisions. Negative results must be combined with clinical observations, patient history, and epidemiological information. The expected result is Negative. Fact Sheet for Patients: SugarRoll.be Fact Sheet for Healthcare Providers: https://www.-mathews.com/ This test is not yet approved or cleared by the [place] FDA and  has been authorized for detection and/or diagnosis of SARS-CoV-2 by FDA under an Emergency Use Authorization (EUA). This EUA will remain  in effect (meaning this test can be used) for the duration of the COVID-19 declaration under Section 56 4(b)(1) of the Act, 21 U.S.C. section 360bbb-3(b)(1), unless the authorization is terminated or revoked sooner. Performed at [facility], [place] [address] [place], [place] [address]   MRSA PCR Screening     Status: None   Collection Time: 02/28/19 11:14 PM   Specimen: Nasal Mucosa; Nasopharyngeal  Result Value Ref Range Status   MRSA by PCR NEGATIVE NEGATIVE Final    Comment:        The GeneXpert MRSA Assay (FDA approved for NASAL specimens only), is one component of a comprehensive MRSA colonization surveillance program. It is not intended to diagnose  MRSA infection nor to guide or monitor treatment for MRSA infections. Performed at [facility], [place] [address] [place], [place] [address]   Culture, blood (routine x 2)     Status: None (Preliminary result)   Collection Time: 03/02/19  8:03 AM   Specimen: BLOOD  Result Value Ref Range Status   Specimen Description BLOOD LEFT ANTECUBITAL  Final   Special Requests   Final    BOTTLES DRAWN AEROBIC AND ANAEROBIC Blood Culture adequate volume   Culture   Final    NO GROWTH < 24 HOURS Performed at [facility], [place] [address] [place], [place] [address]    Report Status PENDING  Incomplete  Culture, blood (routine x 2)     Status: None (Preliminary result)   Collection Time: 03/02/19  8:08 AM   Specimen: BLOOD LEFT HAND  Result Value Ref Range Status   Specimen Description BLOOD LEFT HAND  Final   Special Requests   Final    AEROBIC BOTTLE ONLY Blood Culture results may not be optimal due to an inadequate volume of blood received in culture bottles   Culture   Final    NO GROWTH < 24 HOURS Performed at [facility], [place] [address] [place], [place] [address]    Report Status PENDING  Incomplete     Labs: Basic Metabolic Panel: Recent Labs  Lab 02/28/19 1807 03/01/19 0320 03/02/19 0355 03/03/19 0430  NA 138 137 137 138  K 3.8 3.4* 4.7 3.7  CL 101 101 103 103  CO2 25 26 25 26   GLUCOSE 103* 107* 222* 73  BUN 19 21 21 23   CREATININE 1.22 1.17 1.08 1.21  CALCIUM 9.0 8.6* 8.6* 8.5*   Liver Function Tests: Recent Labs  Lab 03/01/19 0320  AST 23  ALT 20  ALKPHOS 77  BILITOT 0.8  PROT 6.1*  ALBUMIN 3.0*   No results for input(s): LIPASE, AMYLASE in the last 168 hours. No results for input(s): AMMONIA in the last 168 hours. CBC: Recent Labs  Lab 02/28/19 1807 03/01/19 0320 03/02/19 0355 03/03/19 0430  WBC 8.2 6.8 6.4 6.1  NEUTROABS 5.3  --   --   --   HGB 9.1* 8.7* 8.5* 8.6*  HCT 28.9*  26.8* 27.1* 27.2*  MCV 80.3 78.8* 80.2 80.7  PLT 225  208 213 237   Cardiac Enzymes: No results for input(s): CKTOTAL, CKMB, CKMBINDEX, TROPONINI in the last 168 hours. BNP: BNP (last 3 results) Recent Labs    11/26/18 1036  BNP 428.0*    ProBNP (last 3 results) No results for input(s): PROBNP in the last 8760 hours.  CBG: Recent Labs  Lab 03/02/19 1655 03/02/19 2106 03/03/19 0639 03/03/19 0713 03/03/19 1139  GLUCAP 150* 154* 65* 90 166*       Signed:  Dia Crawford, MD [facility] [phone] pager

## 2019-03-03 NOTE — Plan of Care (Signed)
°  Problem: Elimination: °Goal: Will not experience complications related to bowel motility °Outcome: Progressing °  °Problem: Pain Managment: °Goal: General experience of comfort will improve °Outcome: Progressing °  °

## 2019-03-03 NOTE — Progress Notes (Signed)
Physical Therapy Treatment Patient Details Name: Mike Wright MRN: [id number] DOB: [birth year] Today's Date: 03/03/2019    History of Present Illness Pt is an 85 y/o male s/p R second toe amputation secondary to gangrene. PMH including but not limited to a-fib, CHF, COPD, HTN, bilateral great toe amputations in 2019, bilateral TKA and lumbar fusion in 2002.    PT Comments    Continuing work on functional mobility and activity tolerance;  Noting improvements in mobility, amb distance, and most importantly, keeping weight off of r forefoot with activity in Darco shoe; Stair training complete; OK for dc home from PT standpoint, and will conitnue to follow while in hospital   Follow Up Recommendations  Home health PT;Other (comment)(has worked with Truddie Hidden (PT) with [facility] in the past)     Equipment Recommendations  None recommended by PT;Other (comment)(pt has all necessary DME at home)    Recommendations for Other Services       Precautions / Restrictions Precautions Precautions: Fall Required Braces or Orthoses: Other Brace Other Brace: DARCO shoe Restrictions RLE Weight Bearing: Partial weight bearing RLE Partial Weight Bearing Percentage or Pounds: Through heel only with DARCO shoe     Mobility  Bed Mobility                  Transfers Overall transfer level: Needs assistance Equipment used: Rolling walker (2 wheeled) Transfers: Sit to/from Stand Sit to Stand: Min assist;Min guard         General transfer comment: Cues for hand placement and safety; min assist to steady RW with stand from bed; minguard with stand from recliner  Ambulation/Gait Ambulation/Gait assistance: Min guard(without physical contact) Gait Distance (Feet): 90 Feet Assistive device: Rolling walker (2 wheeled) Gait Pattern/deviations: Decreased stance time - right Gait velocity: decreased   General Gait Details: Focused on keeping weight off of forefoot during amb today with  pretty good success; Shortened bilateral step lengths, and that helped to keep him from tipping R toes down; shorter inefficient steps, but better able to keep wight off of R forefoot   Stairs Stairs: Yes Stairs assistance: Min guard Stair Management: One rail Right;Step to pattern;Sideways Number of Stairs: 2(x2) General stair comments: verbal and demo cues for sequence; overall managed steps well   Wheelchair Mobility    Modified Rankin (Stroke Patients Only)       Balance                                            Cognition Arousal/Alertness: Awake/alert Behavior During Therapy: WFL for tasks assessed/performed Overall Cognitive Status: Within Functional Limits for tasks assessed                                        Exercises      General Comments General comments (skin integrity, edema, etc.): We discussed car transfers; and he voiced confidence with his ability to get in and OOB      Pertinent Vitals/Pain Pain Assessment: 0-10 Pain Score: 0-No pain    Home Living                      Prior Function            PT Goals (current goals can now be  found in the care plan section) Acute Rehab PT Goals Patient Stated Goal: "to go home" PT Goal Formulation: With patient Time For Goal Achievement: 03/16/19 Potential to Achieve Goals: Good Progress towards PT goals: Progressing toward goals    Frequency    Min 3X/week      PT Plan Current plan remains appropriate    Co-evaluation              AM-PAC PT "6 Clicks" Mobility   Outcome Measure  Help needed turning from your back to your side while in a flat bed without using bedrails?: None Help needed moving from lying on your back to sitting on the side of a flat bed without using bedrails?: None Help needed moving to and from a bed to a chair (including a wheelchair)?: A Little Help needed standing up from a chair using your arms (e.g., wheelchair or  bedside chair)?: None Help needed to walk in hospital room?: A Little Help needed climbing 3-5 steps with a railing? : A Little 6 Click Score: 21    End of Session Equipment Utilized During Treatment: Gait belt Activity Tolerance: Patient tolerated treatment well Patient left: in chair;with call bell/phone within reach Nurse Communication: Mobility status;Other (comment)(and ok to dc today from PT standpoint) PT Visit Diagnosis: Other abnormalities of gait and mobility (R26.89);Pain Pain - Right/Left: Right Pain - part of body: Ankle and joints of foot     Time: 0826-0850 PT Time Calculation (min) (ACUTE ONLY): 24 min  Charges:  $Gait Training: 23-37 mins                     Roney Marion, [place] Pager [phone] Office [place] 03/03/2019, 9:04 AM

## 2019-03-03 NOTE — Progress Notes (Signed)
Pt stable. Pt ready for discharge. Pt educated and voiced understanding.

## 2019-03-03 NOTE — Progress Notes (Signed)
Pt discharged home 1410.

## 2019-03-03 NOTE — TOC Transition Note (Signed)
Transition of Care [facility]) - CM/SW Discharge Note   Patient Details  Name: Mike Wright MRN: [id number] Date of Birth: 1933/09/20  Transition of Care [facility]) CM/SW Contact:  Claudie Leach, RN Phone Number: [phone] 03/03/2019, 4:04 PM   Clinical Narrative:    Pt to d/c home with [facility] services. Patient has used [facility] in the past, but they are unable to accept due to staffing.  [facility] Referral accepted by [facility].  Patient states does not need any DME.   Final next level of care: [place] Barriers to Discharge: No Barriers Identified   Patient Goals and CMS Choice Patient states their goals for this hospitalization and ongoing recovery are:: to go home CMS Medicare.gov Compare Post Acute Care list provided to:: Patient Choice offered to / list presented to : Patient   Discharge Plan and Services      HH Arranged: RN, PT, OT, Nurse's Aide, Social Work [employer] Agency: [place] Date [facility] Agency Contacted: 03/03/19 Time [place]: 1504 Representative spoke with at [place]: Tommi Rumps

## 2019-03-04 ENCOUNTER — Other Ambulatory Visit: Payer: Self-pay

## 2019-03-04 NOTE — Patient Outreach (Signed)
[place] [facility]) Care Management  [place]  03/04/2019   FINBAR MALEC 04-Sep-1933 [id number]  Subjective: Telephone call to patient for follow up from recent hospitalization related to right second toe amputation.  Patient reports he feels pretty good.  He reports some minimal discomfort to amputation site.  Patient waiting for home health to contact him.  He does have contact information. Advised patient that if they do not make contact to call.  He verbalized understanding.  He has a follow up with PCP next Tuesday and will see surgeon in 2 weeks.  Discussed signs of infection to surgical site and need to contact surgeon if infection symptoms arise.  He verbalized understanding and denies and needs.   Objective:   Encounter Medications:  Outpatient Encounter Medications as of 03/04/2019  Medication Sig  . acetaminophen (TYLENOL) 500 MG tablet Take 1 tablet (500 mg total) by mouth every 6 (six) hours as needed for mild pain or headache.  [name] amoxicillin-clavulanate (AUGMENTIN) 875-125 MG tablet Take 1 tablet by mouth every 12 (twelve) hours.  [name] atorvastatin (LIPITOR) 20 MG tablet Take 1 tablet (20 mg total) by mouth daily at 6 PM.  . Cholecalciferol (D3-1000 PO) Take 1,000 Units by mouth daily.   . Cyanocobalamin (B-12 PO) Take 1 tablet by mouth daily.   [name] DILAUDID 2 MG tablet Take 0.5 tablets (1 mg total) by mouth every 12 (twelve) hours as needed for up to 10 days for severe pain.  [name] ELIQUIS 5 MG TABS tablet TAKE 1 TABLET TWICE DAILY (Patient taking differently: Take 5 mg by mouth 2 (two) times daily. )  . folic acid (FOLVITE) 1 MG tablet Take 1 tablet (1 mg total) by mouth daily.  . furosemide (LASIX) 40 MG tablet TAKE 1 TABLET TWICE DAILY  (DOSE  CHANGE) (Patient taking differently: Take 40 mg by mouth 2 (two) times daily. )  . ibuprofen (ADVIL) 600 MG tablet Take 1 tablet (600 mg total) by mouth every 6 (six) hours as needed for moderate pain.  [name] insulin glargine  (LANTUS) 100 UNIT/ML injection Inject 30-40 Units into the skin See admin instructions. Inject 40 units SQ in the morning and inject 30 units SQ at bedtime  . isosorbide mononitrate (IMDUR) 30 MG 24 hr tablet Take 0.5 tablets (15 mg total) by mouth daily.  [name] lisinopril (ZESTRIL) 5 MG tablet Take 0.5 tablets (2.5 mg total) by mouth daily.  . meclizine (ANTIVERT) 12.5 MG tablet Take 1 tablet (12.5 mg total) by mouth 3 (three) times daily as needed for dizziness.  . metFORMIN (GLUCOPHAGE) 1000 MG tablet Take 1,000 mg by mouth 2 (two) times daily.  . nitroGLYCERIN (NITROSTAT) 0.4 MG SL tablet PLACE 1 TABLET (0.4 MG TOTAL) UNDER THE TONGUE EVERY 5  MINUTES AS NEEDED FOR CHEST PAIN. (Patient taking differently: Place 0.4 mg under the tongue every 5 (five) minutes as needed for chest pain. )  . ondansetron (ZOFRAN) 4 MG tablet Take 1 tablet (4 mg total) by mouth every 6 (six) hours as needed for nausea.  . pantoprazole (PROTONIX) 40 MG tablet Take 1 tablet (40 mg total) by mouth 2 (two) times daily. To protect stomach while taking multiple blood thinners. (Patient taking differently: Take 40 mg by mouth every morning. )  . Pediatric Multivitamins-Iron (FLINTSTONES COMPLETE) 18 MG CHEW Chew 1 tablet by mouth 2 (two) times daily.  . potassium chloride SA (K-DUR) 20 MEQ tablet Take 1 tablet (20 mEq total) by mouth daily.  [name]  psyllium (METAMUCIL) 58.6 % packet Take 1 packet by mouth 2 (two) times daily.   . traZODone (DESYREL) 100 MG tablet Take 100-200 mg by mouth at bedtime.    No facility-administered encounter medications on file as of 03/04/2019.     Functional Status:  In your present state of health, do you have any difficulty performing the following activities: 03/01/2019 12/11/2018  Hearing? N Y  Vision? N Y  Difficulty concentrating or making decisions? N N  Walking or climbing stairs? Y Y  Comment sometimes SOB using a walker  Dressing or bathing? N N  Doing errands, shopping? N Y  Comment -  wife does the Landscape architect and eating ? - N  Using the Toilet? - N  In the past six months, have you accidently leaked urine? - Y  Comment - wears depends  Do you have problems with loss of bowel control? - N  Managing your Medications? - N  Managing your Finances? - N  Housekeeping or managing your Housekeeping? - Y  Comment - wife does  Some recent data might be hidden    Fall/Depression Screening: Fall Risk  12/11/2018  Falls in the past year? 1  Number falls in past yr: 1  Injury with Fall? 0  Risk for fall due to : History of fall(s);Impaired balance/gait  Follow up Falls prevention discussed   PHQ 2/9 Scores 12/20/2018  PHQ - 2 Score 0    Assessment: Patient with recent discharge from hospital s/p right second toe amputation.  Patient has MD follow up.    Plan:  [facility] CM Care Plan Problem One     Most Recent Value  Care Plan Problem One  High risk for hospital readmission related to as evidenced by recent hospitalization for Acute on Chronic heart failure, atrial fib, heme positive stools   Role Documenting the Problem One  Care Management [place] for Problem One  Active  THN Long Term Goal   Patient will not experience a hospital readmission over the next 60 days.    THN Long Term Goal Start Date  03/04/19  Interventions for Problem One Long Term Goal  Discussed with patient current clinical condition since hospital discharge,  confirmed that patient has all  medications and denies concerns around medications,  reviewed post-hospital discharge instructions with patient and confirmed that patient has reliable transportation to all scheduled provider appointments post-hospital discharge.  THN CM Short Term Goal #1   Over the next 31 days, patient will demonstrate and/or verbalize understanding of self-health management for long term care of CHF  THN CM Short Term Goal #1 Start Date  03/04/19  Interventions for Short Term Goal #1  Reviewed with patient  regimen, weights, diet and importance of compliance.       RN CM will contact again in the month of December and patient agreeable.   [name], RN, MSN [place] Management Care Management Coordinator Direct Line [phone] Cell [phone] Toll Free: [phone]  Fax: [phone]

## 2019-03-06 ENCOUNTER — Other Ambulatory Visit: Payer: Self-pay

## 2019-03-06 DIAGNOSIS — I5033 Acute on chronic diastolic (congestive) heart failure: Secondary | ICD-10-CM | POA: Diagnosis not present

## 2019-03-06 DIAGNOSIS — J449 Chronic obstructive pulmonary disease, unspecified: Secondary | ICD-10-CM | POA: Diagnosis not present

## 2019-03-06 DIAGNOSIS — I2510 Atherosclerotic heart disease of native coronary artery without angina pectoris: Secondary | ICD-10-CM | POA: Diagnosis not present

## 2019-03-06 DIAGNOSIS — I70261 Atherosclerosis of native arteries of extremities with gangrene, right leg: Secondary | ICD-10-CM | POA: Diagnosis not present

## 2019-03-06 DIAGNOSIS — E1152 Type 2 diabetes mellitus with diabetic peripheral angiopathy with gangrene: Secondary | ICD-10-CM | POA: Diagnosis not present

## 2019-03-06 DIAGNOSIS — Z4781 Encounter for orthopedic aftercare following surgical amputation: Secondary | ICD-10-CM | POA: Diagnosis not present

## 2019-03-06 DIAGNOSIS — I480 Paroxysmal atrial fibrillation: Secondary | ICD-10-CM | POA: Diagnosis not present

## 2019-03-06 DIAGNOSIS — I110 Hypertensive heart disease with heart failure: Secondary | ICD-10-CM | POA: Diagnosis not present

## 2019-03-06 DIAGNOSIS — Z4801 Encounter for change or removal of surgical wound dressing: Secondary | ICD-10-CM | POA: Diagnosis not present

## 2019-03-06 NOTE — Patient Outreach (Signed)
[place] [facility]) Care Management  03/06/2019  Mike Wright 09/29/1933 [id number]   EMMI- General Discharge RED ON EMMI ALERT Day # 1 Date:  03/06/2019 Red Alert Reason:  Unfilled prescriptions? yes  Outreach attempt: spoke with patient.  He reports that he has not filled two of his prescriptions but he is going into town to fill today. He states it was not that he could not afford them but he just makes one trip to town.  He states medications were as needed medications.  He denies any further problems or questions.   Plan: RN CM will contact patient again in the month of December and patient agreeable.    [name], RN, MSN [facility] Care Management Care Management Coordinator Direct Line [phone] Toll Free: [phone]  Fax: [phone]

## 2019-03-07 DIAGNOSIS — G620 Drug-induced polyneuropathy: Secondary | ICD-10-CM | POA: Diagnosis not present

## 2019-03-07 DIAGNOSIS — E261 Secondary hyperaldosteronism: Secondary | ICD-10-CM | POA: Diagnosis not present

## 2019-03-07 DIAGNOSIS — Z89411 Acquired absence of right great toe: Secondary | ICD-10-CM | POA: Diagnosis not present

## 2019-03-07 DIAGNOSIS — Z4781 Encounter for orthopedic aftercare following surgical amputation: Secondary | ICD-10-CM | POA: Diagnosis not present

## 2019-03-07 DIAGNOSIS — Z89412 Acquired absence of left great toe: Secondary | ICD-10-CM | POA: Diagnosis not present

## 2019-03-07 DIAGNOSIS — E1152 Type 2 diabetes mellitus with diabetic peripheral angiopathy with gangrene: Secondary | ICD-10-CM | POA: Diagnosis not present

## 2019-03-07 DIAGNOSIS — I480 Paroxysmal atrial fibrillation: Secondary | ICD-10-CM | POA: Diagnosis not present

## 2019-03-07 DIAGNOSIS — E785 Hyperlipidemia, unspecified: Secondary | ICD-10-CM | POA: Diagnosis not present

## 2019-03-07 DIAGNOSIS — I5033 Acute on chronic diastolic (congestive) heart failure: Secondary | ICD-10-CM | POA: Diagnosis not present

## 2019-03-07 DIAGNOSIS — E1151 Type 2 diabetes mellitus with diabetic peripheral angiopathy without gangrene: Secondary | ICD-10-CM | POA: Diagnosis not present

## 2019-03-07 DIAGNOSIS — J449 Chronic obstructive pulmonary disease, unspecified: Secondary | ICD-10-CM | POA: Diagnosis not present

## 2019-03-07 DIAGNOSIS — I2510 Atherosclerotic heart disease of native coronary artery without angina pectoris: Secondary | ICD-10-CM | POA: Diagnosis not present

## 2019-03-07 DIAGNOSIS — T466X5D Adverse effect of antihyperlipidemic and antiarteriosclerotic drugs, subsequent encounter: Secondary | ICD-10-CM | POA: Diagnosis not present

## 2019-03-07 DIAGNOSIS — I110 Hypertensive heart disease with heart failure: Secondary | ICD-10-CM | POA: Diagnosis not present

## 2019-03-07 DIAGNOSIS — Z89421 Acquired absence of other right toe(s): Secondary | ICD-10-CM | POA: Diagnosis not present

## 2019-03-07 DIAGNOSIS — E1169 Type 2 diabetes mellitus with other specified complication: Secondary | ICD-10-CM | POA: Diagnosis not present

## 2019-03-07 DIAGNOSIS — Z794 Long term (current) use of insulin: Secondary | ICD-10-CM | POA: Diagnosis not present

## 2019-03-07 DIAGNOSIS — I5032 Chronic diastolic (congestive) heart failure: Secondary | ICD-10-CM | POA: Diagnosis not present

## 2019-03-07 DIAGNOSIS — E1165 Type 2 diabetes mellitus with hyperglycemia: Secondary | ICD-10-CM | POA: Diagnosis not present

## 2019-03-07 DIAGNOSIS — E1142 Type 2 diabetes mellitus with diabetic polyneuropathy: Secondary | ICD-10-CM | POA: Diagnosis not present

## 2019-03-07 DIAGNOSIS — I70261 Atherosclerosis of native arteries of extremities with gangrene, right leg: Secondary | ICD-10-CM | POA: Diagnosis not present

## 2019-03-07 DIAGNOSIS — Z4801 Encounter for change or removal of surgical wound dressing: Secondary | ICD-10-CM | POA: Diagnosis not present

## 2019-03-07 DIAGNOSIS — I96 Gangrene, not elsewhere classified: Secondary | ICD-10-CM | POA: Diagnosis not present

## 2019-03-07 DIAGNOSIS — Z872 Personal history of diseases of the skin and subcutaneous tissue: Secondary | ICD-10-CM | POA: Diagnosis not present

## 2019-03-07 DIAGNOSIS — I509 Heart failure, unspecified: Secondary | ICD-10-CM | POA: Diagnosis not present

## 2019-03-07 LAB — CULTURE, BLOOD (ROUTINE X 2)
Culture: NO GROWTH
Culture: NO GROWTH
Special Requests: ADEQUATE

## 2019-03-11 ENCOUNTER — Other Ambulatory Visit: Payer: Self-pay

## 2019-03-11 DIAGNOSIS — E1152 Type 2 diabetes mellitus with diabetic peripheral angiopathy with gangrene: Secondary | ICD-10-CM | POA: Diagnosis not present

## 2019-03-11 DIAGNOSIS — I110 Hypertensive heart disease with heart failure: Secondary | ICD-10-CM | POA: Diagnosis not present

## 2019-03-11 DIAGNOSIS — I70261 Atherosclerosis of native arteries of extremities with gangrene, right leg: Secondary | ICD-10-CM | POA: Diagnosis not present

## 2019-03-11 DIAGNOSIS — J449 Chronic obstructive pulmonary disease, unspecified: Secondary | ICD-10-CM | POA: Diagnosis not present

## 2019-03-11 DIAGNOSIS — I480 Paroxysmal atrial fibrillation: Secondary | ICD-10-CM | POA: Diagnosis not present

## 2019-03-11 DIAGNOSIS — Z4801 Encounter for change or removal of surgical wound dressing: Secondary | ICD-10-CM | POA: Diagnosis not present

## 2019-03-11 DIAGNOSIS — I2510 Atherosclerotic heart disease of native coronary artery without angina pectoris: Secondary | ICD-10-CM | POA: Diagnosis not present

## 2019-03-11 DIAGNOSIS — I5033 Acute on chronic diastolic (congestive) heart failure: Secondary | ICD-10-CM | POA: Diagnosis not present

## 2019-03-11 DIAGNOSIS — Z4781 Encounter for orthopedic aftercare following surgical amputation: Secondary | ICD-10-CM | POA: Diagnosis not present
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
## 2019-03-12 DIAGNOSIS — Z6829 Body mass index (BMI) 29.0-29.9, adult: Secondary | ICD-10-CM | POA: Diagnosis not present

## 2019-03-12 DIAGNOSIS — Z299 Encounter for prophylactic measures, unspecified: Secondary | ICD-10-CM | POA: Diagnosis not present

## 2019-03-12 DIAGNOSIS — I509 Heart failure, unspecified: Secondary | ICD-10-CM | POA: Diagnosis not present

## 2019-03-12 DIAGNOSIS — E1165 Type 2 diabetes mellitus with hyperglycemia: Secondary | ICD-10-CM | POA: Diagnosis not present

## 2019-03-12 DIAGNOSIS — I10 Essential (primary) hypertension: Secondary | ICD-10-CM | POA: Diagnosis not present

## 2019-03-12 DIAGNOSIS — Z89421 Acquired absence of other right toe(s): Secondary | ICD-10-CM | POA: Diagnosis not present

## 2019-03-13 DIAGNOSIS — I480 Paroxysmal atrial fibrillation: Secondary | ICD-10-CM | POA: Diagnosis not present

## 2019-03-13 DIAGNOSIS — I2510 Atherosclerotic heart disease of native coronary artery without angina pectoris: Secondary | ICD-10-CM | POA: Diagnosis not present

## 2019-03-13 DIAGNOSIS — Z4801 Encounter for change or removal of surgical wound dressing: Secondary | ICD-10-CM | POA: Diagnosis not present

## 2019-03-13 DIAGNOSIS — Z4781 Encounter for orthopedic aftercare following surgical amputation: Secondary | ICD-10-CM | POA: Diagnosis not present

## 2019-03-13 DIAGNOSIS — I70261 Atherosclerosis of native arteries of extremities with gangrene, right leg: Secondary | ICD-10-CM | POA: Diagnosis not present

## 2019-03-13 DIAGNOSIS — E1152 Type 2 diabetes mellitus with diabetic peripheral angiopathy with gangrene: Secondary | ICD-10-CM | POA: Diagnosis not present

## 2019-03-13 DIAGNOSIS — I110 Hypertensive heart disease with heart failure: Secondary | ICD-10-CM | POA: Diagnosis not present

## 2019-03-13 DIAGNOSIS — I5033 Acute on chronic diastolic (congestive) heart failure: Secondary | ICD-10-CM | POA: Diagnosis not present

## 2019-03-13 DIAGNOSIS — J449 Chronic obstructive pulmonary disease, unspecified: Secondary | ICD-10-CM | POA: Diagnosis not present

## 2019-03-14 ENCOUNTER — Ambulatory Visit: Payer: Self-pay

## 2019-03-14 DIAGNOSIS — Z4801 Encounter for change or removal of surgical wound dressing: Secondary | ICD-10-CM | POA: Diagnosis not present

## 2019-03-14 DIAGNOSIS — Z4781 Encounter for orthopedic aftercare following surgical amputation: Secondary | ICD-10-CM | POA: Diagnosis not present

## 2019-03-14 DIAGNOSIS — J449 Chronic obstructive pulmonary disease, unspecified: Secondary | ICD-10-CM | POA: Diagnosis not present

## 2019-03-14 DIAGNOSIS — I70261 Atherosclerosis of native arteries of extremities with gangrene, right leg: Secondary | ICD-10-CM | POA: Diagnosis not present

## 2019-03-14 DIAGNOSIS — E1152 Type 2 diabetes mellitus with diabetic peripheral angiopathy with gangrene: Secondary | ICD-10-CM | POA: Diagnosis not present

## 2019-03-14 DIAGNOSIS — I480 Paroxysmal atrial fibrillation: Secondary | ICD-10-CM | POA: Diagnosis not present

## 2019-03-14 DIAGNOSIS — I110 Hypertensive heart disease with heart failure: Secondary | ICD-10-CM | POA: Diagnosis not present

## 2019-03-14 DIAGNOSIS — I5033 Acute on chronic diastolic (congestive) heart failure: Secondary | ICD-10-CM | POA: Diagnosis not present

## 2019-03-14 DIAGNOSIS — I2510 Atherosclerotic heart disease of native coronary artery without angina pectoris: Secondary | ICD-10-CM | POA: Diagnosis not present

## 2019-03-15 DIAGNOSIS — I480 Paroxysmal atrial fibrillation: Secondary | ICD-10-CM | POA: Diagnosis not present

## 2019-03-15 DIAGNOSIS — Z4781 Encounter for orthopedic aftercare following surgical amputation: Secondary | ICD-10-CM | POA: Diagnosis not present

## 2019-03-15 DIAGNOSIS — I5033 Acute on chronic diastolic (congestive) heart failure: Secondary | ICD-10-CM | POA: Diagnosis not present

## 2019-03-15 DIAGNOSIS — I2510 Atherosclerotic heart disease of native coronary artery without angina pectoris: Secondary | ICD-10-CM | POA: Diagnosis not present

## 2019-03-15 DIAGNOSIS — I70261 Atherosclerosis of native arteries of extremities with gangrene, right leg: Secondary | ICD-10-CM | POA: Diagnosis not present

## 2019-03-15 DIAGNOSIS — E1152 Type 2 diabetes mellitus with diabetic peripheral angiopathy with gangrene: Secondary | ICD-10-CM | POA: Diagnosis not present

## 2019-03-15 DIAGNOSIS — J449 Chronic obstructive pulmonary disease, unspecified: Secondary | ICD-10-CM | POA: Diagnosis not present

## 2019-03-15 DIAGNOSIS — Z4801 Encounter for change or removal of surgical wound dressing: Secondary | ICD-10-CM | POA: Diagnosis not present

## 2019-03-15 DIAGNOSIS — I110 Hypertensive heart disease with heart failure: Secondary | ICD-10-CM | POA: Diagnosis not present

## 2019-03-18 DIAGNOSIS — I70261 Atherosclerosis of native arteries of extremities with gangrene, right leg: Secondary | ICD-10-CM | POA: Diagnosis not present

## 2019-03-18 DIAGNOSIS — Z4781 Encounter for orthopedic aftercare following surgical amputation: Secondary | ICD-10-CM | POA: Diagnosis not present

## 2019-03-18 DIAGNOSIS — J449 Chronic obstructive pulmonary disease, unspecified: Secondary | ICD-10-CM | POA: Diagnosis not present

## 2019-03-18 DIAGNOSIS — I110 Hypertensive heart disease with heart failure: Secondary | ICD-10-CM | POA: Diagnosis not present

## 2019-03-18 DIAGNOSIS — I2510 Atherosclerotic heart disease of native coronary artery without angina pectoris: Secondary | ICD-10-CM | POA: Diagnosis not present

## 2019-03-18 DIAGNOSIS — E1152 Type 2 diabetes mellitus with diabetic peripheral angiopathy with gangrene: Secondary | ICD-10-CM | POA: Diagnosis not present

## 2019-03-18 DIAGNOSIS — Z4801 Encounter for change or removal of surgical wound dressing: Secondary | ICD-10-CM | POA: Diagnosis not present

## 2019-03-18 DIAGNOSIS — I480 Paroxysmal atrial fibrillation: Secondary | ICD-10-CM | POA: Diagnosis not present

## 2019-03-18 DIAGNOSIS — I5033 Acute on chronic diastolic (congestive) heart failure: Secondary | ICD-10-CM | POA: Diagnosis not present

## 2019-03-18 NOTE — Progress Notes (Signed)
POST OPERATIVE OFFICE NOTE    CC:  F/u for surgery  HPI:  This is a 85 y.o. male pt of Dr. Trula Slade who is s/p amputation right 2nd toe by Dr. Scot Dock on 03/01/2019.  He presents today for follow up.  He states he is doing well and has not had any issues.   Pt has significant history of peripheral arterial disease with balloon angioplasty of the left peroneal and posterior tibial arteries and right posterior tibial artery in 2019.  Has bilateral great toe amputations.  Last saw Dr. Trula Slade in February.  At that time wounds had all healed.  Patient  does take Eliquis for paroxysmal atrial fibrillation.    Allergies  Allergen Reactions  . Bactrim [Sulfamethoxazole-Trimethoprim]     Pancytopenia  . Codeine Shortness Of Breath  . Heparin Other (See Comments)    +HIT,  Severe bleeding (with heparin drip & large doses)  . Losartan Swelling    Per ENT UNSPECIFIED REACTION   . Other Other (See Comments)    Severe bleeding UNSPECIFIED AGENT   . Oxycodone Other (See Comments)    "Made me act out of my mind" Other reaction(s): Other (See Comments) Mental status changes hallucinations    Current Outpatient Medications  Medication Sig Dispense Refill  . acetaminophen (TYLENOL) 500 MG tablet Take 1 tablet (500 mg total) by mouth every 6 (six) hours as needed for mild pain or headache. 30 tablet 0  . amoxicillin-clavulanate (AUGMENTIN) 875-125 MG tablet Take 1 tablet by mouth every 12 (twelve) hours. 28 tablet 0  . atorvastatin (LIPITOR) 20 MG tablet Take 1 tablet (20 mg total) by mouth daily at 6 PM. 30 tablet 0  . Cholecalciferol (D3-1000 PO) Take 1,000 Units by mouth daily.     . Cyanocobalamin (B-12 PO) Take 1 tablet by mouth daily.     [name] ELIQUIS 5 MG TABS tablet TAKE 1 TABLET TWICE DAILY (Patient taking differently: Take 5 mg by mouth 2 (two) times daily. ) [id number] tablet 3  . folic acid (FOLVITE) 1 MG tablet Take 1 tablet (1 mg total) by mouth daily. 30 tablet 1  . furosemide (LASIX) 40 MG  tablet TAKE 1 TABLET TWICE DAILY  (DOSE  CHANGE) (Patient taking differently: Take 40 mg by mouth 2 (two) times daily. ) 180 tablet 1  . ibuprofen (ADVIL) 600 MG tablet Take 1 tablet (600 mg total) by mouth every 6 (six) hours as needed for moderate pain. 30 tablet 0  . insulin glargine (LANTUS) 100 UNIT/ML injection Inject 30-40 Units into the skin See admin instructions. Inject 40 units SQ in the morning and inject 30 units SQ at bedtime    . isosorbide mononitrate (IMDUR) 30 MG 24 hr tablet Take 0.5 tablets (15 mg total) by mouth daily. 45 tablet 3  . lisinopril (ZESTRIL) 5 MG tablet Take 0.5 tablets (2.5 mg total) by mouth daily.    . meclizine (ANTIVERT) 12.5 MG tablet Take 1 tablet (12.5 mg total) by mouth 3 (three) times daily as needed for dizziness. 30 tablet 0  . metFORMIN (GLUCOPHAGE) 1000 MG tablet Take 1,000 mg by mouth 2 (two) times daily.    . nitroGLYCERIN (NITROSTAT) 0.4 MG SL tablet PLACE 1 TABLET (0.4 MG TOTAL) UNDER THE TONGUE EVERY 5  MINUTES AS NEEDED FOR CHEST PAIN. (Patient taking differently: Place 0.4 mg under the tongue every 5 (five) minutes as needed for chest pain. ) 25 tablet 3  . ondansetron (ZOFRAN) 4 MG tablet Take 1 tablet (  4 mg total) by mouth every 6 (six) hours as needed for nausea. 20 tablet 0  . pantoprazole (PROTONIX) 40 MG tablet Take 1 tablet (40 mg total) by mouth 2 (two) times daily. To protect stomach while taking multiple blood thinners. (Patient taking differently: Take 40 mg by mouth every morning. ) 60 tablet 2  . Pediatric Multivitamins-Iron (FLINTSTONES COMPLETE) 18 MG CHEW Chew 1 tablet by mouth 2 (two) times daily.    . potassium chloride SA (K-DUR) 20 MEQ tablet Take 1 tablet (20 mEq total) by mouth daily. 30 tablet 1  . psyllium (METAMUCIL) 58.6 % packet Take 1 packet by mouth 2 (two) times daily.     . traZODone (DESYREL) 100 MG tablet Take 100-200 mg by mouth at bedtime.      No current facility-administered medications for this visit.      ROS:  See HPI  Physical Exam:  Today's Vitals   03/20/19 1534  BP: 128/77  Pulse: 71  Resp: 20  Temp: 97.6 F (36.4 C)  TempSrc: Oral  SpO2: 99%  Weight: 192 lb (87.1 kg)  Height: 5\' 7"  (1.702 m)   Body mass index is 30.07 kg/m.   Incision:      Extremities:  +doppler signals right AT/PT   Assessment/Plan:  This is a 85 y.o. male who is s/p:  amputation right 2nd toe by Dr. Scot Dock on 03/01/2019  -pt doing well.  Discussed with Dr. Scot Dock and will get sutures out today.  He will continue walking with a darco shoe for a couple more weeks.  He will return in 2 months with BLE arterial duplex and ABI's.  -discussed this with his wife by telephone as well.   -he will call sooner should he have any issues before then.    Leontine Locket, PA-C [facility] [phone]  Clinic MD:  Scot Dock

## 2019-03-19 ENCOUNTER — Ambulatory Visit: Payer: Self-pay

## 2019-03-19 DIAGNOSIS — J449 Chronic obstructive pulmonary disease, unspecified: Secondary | ICD-10-CM | POA: Diagnosis not present

## 2019-03-19 DIAGNOSIS — I5033 Acute on chronic diastolic (congestive) heart failure: Secondary | ICD-10-CM | POA: Diagnosis not present

## 2019-03-19 DIAGNOSIS — I2510 Atherosclerotic heart disease of native coronary artery without angina pectoris: Secondary | ICD-10-CM | POA: Diagnosis not present

## 2019-03-19 DIAGNOSIS — Z4801 Encounter for change or removal of surgical wound dressing: Secondary | ICD-10-CM | POA: Diagnosis not present

## 2019-03-19 DIAGNOSIS — I110 Hypertensive heart disease with heart failure: Secondary | ICD-10-CM | POA: Diagnosis not present

## 2019-03-19 DIAGNOSIS — Z4781 Encounter for orthopedic aftercare following surgical amputation: Secondary | ICD-10-CM | POA: Diagnosis not present

## 2019-03-19 DIAGNOSIS — E1152 Type 2 diabetes mellitus with diabetic peripheral angiopathy with gangrene: Secondary | ICD-10-CM | POA: Diagnosis not present

## 2019-03-19 DIAGNOSIS — I70261 Atherosclerosis of native arteries of extremities with gangrene, right leg: Secondary | ICD-10-CM | POA: Diagnosis not present

## 2019-03-19 DIAGNOSIS — I480 Paroxysmal atrial fibrillation: Secondary | ICD-10-CM | POA: Diagnosis not present

## 2019-03-20 ENCOUNTER — Ambulatory Visit (INDEPENDENT_AMBULATORY_CARE_PROVIDER_SITE_OTHER): Payer: Self-pay | Admitting: Physician Assistant

## 2019-03-20 ENCOUNTER — Other Ambulatory Visit: Payer: Self-pay

## 2019-03-20 VITALS — BP 128/77 | HR 71 | Temp 97.6°F | Resp 20 | Ht 67.0 in | Wt 192.0 lb

## 2019-03-20 DIAGNOSIS — Z89421 Acquired absence of other right toe(s): Secondary | ICD-10-CM

## 2019-03-21 DIAGNOSIS — E1152 Type 2 diabetes mellitus with diabetic peripheral angiopathy with gangrene: Secondary | ICD-10-CM | POA: Diagnosis not present

## 2019-03-21 DIAGNOSIS — I70261 Atherosclerosis of native arteries of extremities with gangrene, right leg: Secondary | ICD-10-CM | POA: Diagnosis not present

## 2019-03-21 DIAGNOSIS — I5033 Acute on chronic diastolic (congestive) heart failure: Secondary | ICD-10-CM | POA: Diagnosis not present

## 2019-03-21 DIAGNOSIS — Z4801 Encounter for change or removal of surgical wound dressing: Secondary | ICD-10-CM | POA: Diagnosis not present

## 2019-03-21 DIAGNOSIS — I2510 Atherosclerotic heart disease of native coronary artery without angina pectoris: Secondary | ICD-10-CM | POA: Diagnosis not present

## 2019-03-21 DIAGNOSIS — I480 Paroxysmal atrial fibrillation: Secondary | ICD-10-CM | POA: Diagnosis not present

## 2019-03-21 DIAGNOSIS — I110 Hypertensive heart disease with heart failure: Secondary | ICD-10-CM | POA: Diagnosis not present

## 2019-03-21 DIAGNOSIS — Z4781 Encounter for orthopedic aftercare following surgical amputation: Secondary | ICD-10-CM | POA: Diagnosis not present

## 2019-03-21 DIAGNOSIS — J449 Chronic obstructive pulmonary disease, unspecified: Secondary | ICD-10-CM | POA: Diagnosis not present

## 2019-03-25 ENCOUNTER — Other Ambulatory Visit: Payer: Self-pay

## 2019-03-25 NOTE — Patient Outreach (Signed)
[place] [facility]) Care Management  03/25/2019  Mike Wright [birth year] [id number]   Telephone call to patient for follow up.  Patient reports doing well.  He reports that he saw surgeon last week and sutures are out and patient wearing a special shoe and in two weeks to go back to his normal shoes if everything is ok.  Patient reports weight up to 188 lbs.  Denies swelling or shortness of breath but states that he will get the weight off as he ate more during the holidays.  Encouraged patient to continue to weigh and monitor for heart failure. Also discussed signs of infection and notifying physician of any changes to amputation site of right 2nd toe.  He verbalized understanding and denies any needs.   Plan: RN CM will attempt patient again in the month of January and patient agreeable.    [name], RN, MSN [place] Management Care Management Coordinator Direct Line [phone] Cell [phone] Toll Free: [phone]  Fax: [phone]

## 2019-03-26 DIAGNOSIS — Z4781 Encounter for orthopedic aftercare following surgical amputation: Secondary | ICD-10-CM | POA: Diagnosis not present

## 2019-03-26 DIAGNOSIS — I480 Paroxysmal atrial fibrillation: Secondary | ICD-10-CM | POA: Diagnosis not present

## 2019-03-26 DIAGNOSIS — E1152 Type 2 diabetes mellitus with diabetic peripheral angiopathy with gangrene: Secondary | ICD-10-CM | POA: Diagnosis not present

## 2019-03-26 DIAGNOSIS — I5033 Acute on chronic diastolic (congestive) heart failure: Secondary | ICD-10-CM | POA: Diagnosis not present

## 2019-03-26 DIAGNOSIS — I2510 Atherosclerotic heart disease of native coronary artery without angina pectoris: Secondary | ICD-10-CM | POA: Diagnosis not present

## 2019-03-26 DIAGNOSIS — I110 Hypertensive heart disease with heart failure: Secondary | ICD-10-CM | POA: Diagnosis not present

## 2019-03-26 DIAGNOSIS — Z4801 Encounter for change or removal of surgical wound dressing: Secondary | ICD-10-CM | POA: Diagnosis not present

## 2019-03-26 DIAGNOSIS — J449 Chronic obstructive pulmonary disease, unspecified: Secondary | ICD-10-CM | POA: Diagnosis not present

## 2019-03-26 DIAGNOSIS — I70261 Atherosclerosis of native arteries of extremities with gangrene, right leg: Secondary | ICD-10-CM | POA: Diagnosis not present

## 2019-04-01 ENCOUNTER — Ambulatory Visit: Payer: Medicare HMO | Admitting: Cardiology

## 2019-04-01 ENCOUNTER — Encounter: Payer: Self-pay | Admitting: Cardiology

## 2019-04-01 ENCOUNTER — Other Ambulatory Visit: Payer: Self-pay

## 2019-04-01 VITALS — BP 110/67 | HR 66 | Ht 67.0 in | Wt 194.4 lb

## 2019-04-01 DIAGNOSIS — I739 Peripheral vascular disease, unspecified: Secondary | ICD-10-CM | POA: Diagnosis not present

## 2019-04-01 DIAGNOSIS — Z89412 Acquired absence of left great toe: Secondary | ICD-10-CM | POA: Diagnosis not present

## 2019-04-01 DIAGNOSIS — I4891 Unspecified atrial fibrillation: Secondary | ICD-10-CM | POA: Diagnosis not present

## 2019-04-01 DIAGNOSIS — I10 Essential (primary) hypertension: Secondary | ICD-10-CM

## 2019-04-01 DIAGNOSIS — I2510 Atherosclerotic heart disease of native coronary artery without angina pectoris: Secondary | ICD-10-CM

## 2019-04-01 DIAGNOSIS — Z89411 Acquired absence of right great toe: Secondary | ICD-10-CM | POA: Diagnosis not present

## 2019-04-01 DIAGNOSIS — I5032 Chronic diastolic (congestive) heart failure: Secondary | ICD-10-CM

## 2019-04-01 DIAGNOSIS — Z89421 Acquired absence of other right toe(s): Secondary | ICD-10-CM | POA: Diagnosis not present

## 2019-04-01 MED ORDER — FUROSEMIDE 40 MG PO TABS: ORAL_TABLET | ORAL | 1 refills | Status: DC

## 2019-04-01 NOTE — Progress Notes (Signed)
Clinical Summary Mr. Wasco is a 86 y.o.male seen today for focused visit on recent issues with diastolic HF.   1. CAD - nonobstructive CAD by cath Jan 2012, LVEF 60-65% by LV gram.  - 06/2015 nuclear stress without clear ischemia - 05/2015 echo LVEF 65-70%, no WMAs, cannot evaluate diastolic function [id number] lexiscan without ischemia, low risk study.  -cath 04/2017 as reported below. Received DES to 75% D1, DES to 75% mid to distal LAD, DES to 70% prox LAD RHC with CI 2.67, mean PA 25, no wedge reported by LVEDP 14.  - discharged on triple therapy with ASA, plavix, eliquis with plan for 30 days, then stop ASA.   - 11/22/17 admission with chest pain, anemia. Negative workup for ACS. Symptomsresolved with blood transfusion - 11/2017 admit with chest pain and dyspnea. ACS work negative, CT PE negative. Diuresed 6L with resolustion of symptoms. CXR with rib fracture thought playing a role in chest pain.    -no recent cardiac chest pain   2. Chronic diastolic HF - recent admission 11/2018 with acute on chronic diastolic HF - no recent SOB/DOE. No recent edema. Takes lasix 40mg  bid - home weights down to 184 lbs.  Hospital discharge weight 191 lbs  -some SOB at time. No recent edema    3. Afib - admission 11/2018 with issues with afib - from notes had afib with slow rates, toprol was stopped  - no recent palpitations - no recent bleeding.   4.History of GI bleeding - GI bleed during 11/2018, stopped eliquis at that time - heme +positive stools, negative endoscopy with plans for outpatient pill endoscopy  - no recent bleeding  5. PAD - followed by vascular - admission 08/2017 with ischemia great toes. Had intervention on lower extremity vessels at that time.  -ultimately required ampuation bilateral great toes  - admit 11/2017 for poor healing right great toe amputation, critical limb ischemia - admit 02/2019 with gangrene right second toe with  nonhealing diabetic foot wound. S/p amputations  6. PSVT/Palpitations. - denies any symptoms   Past Medical History:  Diagnosis Date  . Anemia    a. mild, noted 04/2017.  [name] CAD in native artery    a. [name] 04/2017 s/p DES to D1, DES to prox-mid LAD, DES to prox LAD overlapping the prior stent, LVEF 55-65%.   . Chronic diastolic CHF (congestive heart failure) ([place])   . Diabetic ulcer of toe ([place])   . DJD (degenerative joint disease) of cervical spine   . Essential hypertension   . GERD (gastroesophageal reflux disease)   . History of hiatal hernia   . HIT (heparin-induced thrombocytopenia) ([place])   . Hypothyroidism   . Hypoxia    a. went home on home O2 04/2017.  [name] Insomnia   . Mixed hyperlipidemia   . PAD (peripheral artery disease) ([place])   . PAF (paroxysmal atrial fibrillation) ([place])   . PVD (peripheral vascular disease) ([place])   . Renal insufficiency   . 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  . Sinus bradycardia    a. HR 30s-40s in 04/2017 -> diltiazem stopped, metoprolol reduced.  . Sleep apnea    "chose not to order CPAP at this time" (05/18/2017)  . Type 2 diabetes mellitus ([place])   . Wheezing    a. suspected COPD 04/2017. Former tobacco x 40 years.     Allergies  Allergen Reactions  . Bactrim [Sulfamethoxazole-Trimethoprim]     Pancytopenia  . Codeine Shortness  Of Breath  . Heparin Other (See Comments)    +HIT,  Severe bleeding (with heparin drip & large doses)  . Losartan Swelling    Per ENT UNSPECIFIED REACTION   . Other Other (See Comments)    Severe bleeding UNSPECIFIED AGENT   . Oxycodone Other (See Comments)    "Made me act out of my mind" Other reaction(s): Other (See Comments) Mental status changes hallucinations     Current Outpatient Medications  Medication Sig Dispense Refill  . acetaminophen (TYLENOL) 500 MG tablet Take 1 tablet (500 mg total) by mouth every 6 (six) hours as needed for mild pain or headache. 30 tablet 0  .  amoxicillin-clavulanate (AUGMENTIN) 875-125 MG tablet Take 1 tablet by mouth every 12 (twelve) hours. 28 tablet 0  . atorvastatin (LIPITOR) 20 MG tablet Take 1 tablet (20 mg total) by mouth daily at 6 PM. 30 tablet 0  . Cholecalciferol (D3-1000 PO) Take 1,000 Units by mouth daily.     . Cyanocobalamin (B-12 PO) Take 1 tablet by mouth daily.     [name] ELIQUIS 5 MG TABS tablet TAKE 1 TABLET TWICE DAILY (Patient taking differently: Take 5 mg by mouth 2 (two) times daily. ) [id number] tablet 3  . folic acid (FOLVITE) 1 MG tablet Take 1 tablet (1 mg total) by mouth daily. 30 tablet 1  . furosemide (LASIX) 40 MG tablet TAKE 1 TABLET TWICE DAILY  (DOSE  CHANGE) (Patient taking differently: Take 40 mg by mouth 2 (two) times daily. ) 180 tablet 1  . ibuprofen (ADVIL) 600 MG tablet Take 1 tablet (600 mg total) by mouth every 6 (six) hours as needed for moderate pain. 30 tablet 0  . insulin glargine (LANTUS) 100 UNIT/ML injection Inject 30-40 Units into the skin See admin instructions. Inject 40 units SQ in the morning and inject 30 units SQ at bedtime    . isosorbide mononitrate (IMDUR) 30 MG 24 hr tablet Take 0.5 tablets (15 mg total) by mouth daily. 45 tablet 3  . lisinopril (ZESTRIL) 5 MG tablet Take 0.5 tablets (2.5 mg total) by mouth daily.    . meclizine (ANTIVERT) 12.5 MG tablet Take 1 tablet (12.5 mg total) by mouth 3 (three) times daily as needed for dizziness. 30 tablet 0  . metFORMIN (GLUCOPHAGE) 1000 MG tablet Take 1,000 mg by mouth 2 (two) times daily.    . nitroGLYCERIN (NITROSTAT) 0.4 MG SL tablet PLACE 1 TABLET (0.4 MG TOTAL) UNDER THE TONGUE EVERY 5  MINUTES AS NEEDED FOR CHEST PAIN. (Patient taking differently: Place 0.4 mg under the tongue every 5 (five) minutes as needed for chest pain. ) 25 tablet 3  . ondansetron (ZOFRAN) 4 MG tablet Take 1 tablet (4 mg total) by mouth every 6 (six) hours as needed for nausea. 20 tablet 0  . pantoprazole (PROTONIX) 40 MG tablet Take 1 tablet (40 mg total) by mouth 2  (two) times daily. To protect stomach while taking multiple blood thinners. (Patient taking differently: Take 40 mg by mouth every morning. ) 60 tablet 2  . Pediatric Multivitamins-Iron (FLINTSTONES COMPLETE) 18 MG CHEW Chew 1 tablet by mouth 2 (two) times daily.    . potassium chloride SA (K-DUR) 20 MEQ tablet Take 1 tablet (20 mEq total) by mouth daily. 30 tablet 1  . psyllium (METAMUCIL) 58.6 % packet Take 1 packet by mouth 2 (two) times daily.     . traZODone (DESYREL) 100 MG tablet Take 100-200 mg by mouth at bedtime.  No current facility-administered medications for this visit.     Past Surgical History:  Procedure Laterality Date  . ABDOMINAL AORTOGRAM W/LOWER EXTREMITY N/A 09/15/2017   Procedure: ABDOMINAL AORTOGRAM W/LOWER EXTREMITY;  Surgeon: Serafina Mitchell, MD;  Location: [facility];  Service: Cardiovascular;  Laterality: N/A;  . AMPUTATION Bilateral 09/20/2017   Procedure: BILATERAL GREAT TOE AMPUTATIONS INCLUDING METATARSAL HEADS;  Surgeon: Serafina Mitchell, MD;  [facility];  Service: Vascular;  Laterality: Bilateral;  . AMPUTATION Right 03/01/2019   Procedure: AMPUTATION TOES;  Surgeon: Angelia Mould, MD;  Location: [facility];  Service: Vascular;  Laterality: Right;  . ANGIOPLASTY Right 09/20/2017   Procedure: ANGIOPLASTY RIGHT TIBIAL ARTERY;  Surgeon: Serafina Mitchell, MD;  Location: [facility];  Service: Vascular;  Laterality: Right;  . APPENDECTOMY    . BACK SURGERY    . CARDIAC CATHETERIZATION  1980s; 2012;  . CATARACT EXTRACTION Left 2004  . COLONOSCOPY  2004   FLEISHMAN TICS  . COLONOSCOPY WITH PROPOFOL N/A 11/28/2018   Procedure: COLONOSCOPY WITH PROPOFOL;  Surgeon: Rogene Houston, MD;  [facility];  Service: Endoscopy;  Laterality: N/A;  . CORONARY ANGIOPLASTY WITH STENT PLACEMENT  05/18/2017   "3 stents"  . CORONARY STENT INTERVENTION N/A 05/18/2017   Procedure: CORONARY STENT INTERVENTION;  Surgeon: Jettie Booze, MD;  Location:  [facility];  Service: Cardiovascular;  Laterality: N/A;  . ESOPHAGOGASTRODUODENOSCOPY (EGD) WITH PROPOFOL N/A 11/20/2017   Procedure: ESOPHAGOGASTRODUODENOSCOPY (EGD) WITH PROPOFOL;  Surgeon: Irene Shipper, MD;  Location: [place];  Service: Gastroenterology;  Laterality: N/A;  . ESOPHAGOGASTRODUODENOSCOPY (EGD) WITH PROPOFOL N/A 11/28/2018   Procedure: ESOPHAGOGASTRODUODENOSCOPY (EGD) WITH PROPOFOL;  Surgeon: Rogene Houston, MD;  [facility];  Service: Endoscopy;  Laterality: N/A;  . GIVENS CAPSULE STUDY N/A 12/17/2018   Procedure: GIVENS CAPSULE STUDY;  Surgeon: Rogene Houston, MD;  [facility];  Service: Endoscopy;  Laterality: N/A;  7:30am  . I & D EXTREMITY Right 12/17/2017   Procedure: IRRIGATION AND DEBRIDEMENT RIGHT GREAT TOE;  Surgeon: Serafina Mitchell, MD;  [facility];  Service: Vascular;  Laterality: Right;  . JOINT REPLACEMENT    . LEFT HEART CATH AND CORONARY ANGIOGRAPHY N/A 05/18/2017   Procedure: LEFT HEART CATH AND CORONARY ANGIOGRAPHY;  Surgeon: Jettie Booze, MD;  Location: [place] [facility];  Service: Cardiovascular;  Laterality: N/A;  . LOWER EXTREMITY ANGIOGRAM Right 09/20/2017   Procedure: RIGHT LOWER LEG  ANGIOGRAM;  Surgeon: Serafina Mitchell, MD;  Location: [facility];  Service: Vascular;  Laterality: Right;  . LUMBAR FUSION  2002   L3, 4 L4, 5 L5 S1 Fused by Dr. Glenna Fellows  . PERIPHERAL VASCULAR BALLOON ANGIOPLASTY Left 09/15/2017   Procedure: PERIPHERAL VASCULAR BALLOON ANGIOPLASTY;  Surgeon: Serafina Mitchell, MD;  Location: [facility];  Service: Cardiovascular;  Laterality: Left;  PTA of Peroneal & Posterior Tibial  . POLYPECTOMY  11/28/2018   Procedure: POLYPECTOMY;  Surgeon: Rogene Houston, MD;  [facility];  Service: Endoscopy;;  duodenum  . POSTERIOR LUMBAR FUSION    . RHINOPLASTY    . RIGHT HEART CATH N/A 05/18/2017   Procedure: RIGHT HEART CATH;  Surgeon: Jettie Booze, MD;  Location: [place]  [facility];  Service: Cardiovascular;  Laterality: N/A;  . TEE WITHOUT CARDIOVERSION N/A 09/18/2017   Procedure: TRANSESOPHAGEAL ECHOCARDIOGRAM (TEE);  Surgeon: Fay Records, MD;  Location: [facility];  Service: Cardiovascular;  Laterality: N/A;  . TOTAL KNEE ARTHROPLASTY Bilateral   . TRANSURETHRAL RESECTION OF PROSTATE  2001   [place]     Allergies  Allergen Reactions  . Bactrim [Sulfamethoxazole-Trimethoprim]     Pancytopenia  . Codeine Shortness Of Breath  . Heparin Other (See Comments)    +HIT,  Severe bleeding (with heparin drip & large doses)  . Losartan Swelling    Per ENT UNSPECIFIED REACTION   . Other Other (See Comments)    Severe bleeding UNSPECIFIED AGENT   . Oxycodone Other (See Comments)    "Made me act out of my mind" Other reaction(s): Other (See Comments) Mental status changes hallucinations      Family History  Problem Relation Age of Onset  . Heart attack Father 19  . COPD Father   . COPD Mother   . Heart disease Mother   . Diabetes Mother   . Hypertension Sister   . CVA Sister   . Diabetes Sister   . Multiple sclerosis Sister      Social History Mr. Loiacono reports that he quit smoking about 36 years ago. His smoking use included cigarettes. He started smoking about 66 years ago. He has a 60.00 pack-year smoking history. He has never used smokeless tobacco. Mr. Chaudoin reports previous alcohol use.   Review of Systems CONSTITUTIONAL: No weight loss, fever, chills, weakness or fatigue.  HEENT: Eyes: No visual loss, blurred vision, double vision or yellow sclerae.No hearing loss, sneezing, congestion, runny nose or sore throat.  SKIN: No rash or itching.  CARDIOVASCULAR: per hpi RESPIRATORY: No shortness of breath, cough or sputum.  GASTROINTESTINAL: No anorexia, nausea, vomiting or diarrhea. No abdominal pain or blood.  GENITOURINARY: No burning on urination, no polyuria NEUROLOGICAL: No headache, dizziness, syncope, paralysis, ataxia,  numbness or tingling in the extremities. No change in bowel or bladder control.  MUSCULOSKELETAL: No muscle, back pain, joint pain or stiffness.  LYMPHATICS: No enlarged nodes. No history of splenectomy.  PSYCHIATRIC: No history of depression or anxiety.  ENDOCRINOLOGIC: No reports of sweating, cold or heat intolerance. No polyuria or polydipsia.  [name]   Physical Examination Today's Vitals   04/01/19 1336  BP: 110/67  Pulse: 66  SpO2: 100%  Weight: 194 lb 6.4 oz (88.2 kg)  Height: 5\' 7"  (1.702 m)   Body mass index is 30.45 kg/m.  Gen: resting comfortably, no acute distress HEENT: no scleral icterus, pupils equal round and reactive, no palptable cervical adenopathy,  CV: RRR, 2/6 systolc murmur rusb, no jvd Resp: Clear to auscultation bilaterally GI: abdomen is soft, non-tender, non-distended, normal bowel sounds, no hepatosplenomegaly MSK: extremities are warm, no edema.  Skin: warm, no rash Neuro:  no focal deficits Psych: appropriate affect   Diagnostic Studies 05/2015 echo Study Conclusions  - Left ventricle: The cavity size was normal. Wall thickness was increased increased in a pattern of mild to moderate LVH. Systolic function was vigorous. The estimated ejection fraction was in the range of 65% to 70%. Wall motion was normal; there were no regional wall motion abnormalities. The study is not technically sufficient to allow evaluation of LV diastolic function. - Aortic valve: Moderately calcified annulus. Mildly thickened leaflets. There was mild stenosis. There was mild regurgitation. Mean gradient (S): 13 mm Hg. Valve area (VTI): 1.51 cm^2. - Mitral valve: Mildly calcified annulus. Normal thickness leaflets . - Left atrium: The atrium was moderately dilated. - Technically adequate study.   06/2015 Nuclear stress test  No diagnostic ST segment changes to indicate  ischemia.  Small, mild intensity, perfusion defects noted in the apical  anterior and basal inferolateral walls. This is most consistent with soft tissue attenuation given normal wall motion in these regions. No large ischemic zones noted.  This is a low risk study.  Nuclear stress EF: 64%.  05/2016 Event monitor  Rhythm is atrial fibrillation throughout study. Occasioanal PVCs  Min HR 51, Max HR 107, Avg HR 67  Reported symptoms correspond with rate controlled atrial fibrillation.  09/2016 nuclear stress  There was no ST segment deviation noted during stress.  The study is normal. There are no perfusion defects consistent with prior infarct or current ischemia.  This is a low risk study.  The left ventricular ejection fraction is normal (55-65%).   04/2017 cath  Dist RCA lesion is 40% stenosed.  Prox RCA lesion is 25% stenosed.  1st Diag lesion is 75% stenosed.  A drug-eluting stent was successfully placed using a STENT SYNERGY DES 2.25X12.  Post intervention, there is a 0% residual stenosis.  Prox LAD to Mid LAD lesion is 75% stenosed.  A drug-eluting stent was successfully placed using a STENT SYNERGY DES 3.5X38.  Post intervention, there is a 0% residual stenosis.  Prox LAD lesion is 70% stenosed.  A drug-eluting stent was successfully placed using a STENT SYNERGY DES 4X12, overlapping the prior stent.  Post intervention, there is a 0% residual stenosis.  The left ventricular systolic function is normal.  The left ventricular ejection fraction is 55-65% by visual estimate.  LV end diastolic pressure is normal.  There is no aortic valve stenosis.  Ao sat 98%, PA sat 65%; PA mean 25 mm Hg; unable to wedge the catheter.  Normal right heat pressures.  Tortuous right subclavian making catheter navigation difficult from the right radial approach.  Continue dual antiplatelet therapy along with Eliquis for 30 days.  Restart Eliquis tomorrow.  After 30 days, stop aspirin. Continue Plavix and Eliquis.   After 6 months,  can consider stopping clopidogrel if he has bleeding issues. Given the nomber of stents, would try to complete one year of clopidogrel if no bleeding issues.    Assessment and Plan   1.CAD -PCI as reported above 04/2017 -no recent symptoms, continue current meds   2. Chronic diastolic HF - overall stable, continue lasix 40mg  daily may take additional 20mg  as needed  3. Afib - off av nodal agents due to bradycardia. - no recent symptoms, continue current meds  4. HTN - bp at goal, continue current meds  F/u 6 months  Arnoldo Lenis, M.D.

## 2019-04-01 NOTE — Patient Instructions (Addendum)
Your physician wants you to follow-up in: [place] will receive a reminder letter in the mail two months in advance. If you don't receive a letter, please call our office to schedule the follow-up appointment.  Your physician has recommended you make the following change in your medication:   CHANGE LASIX 40 MG DAILY - MAY TAKE ADDITIONAL 20 MG AS NEEDED   Thank you for choosing [facility]!!

## 2019-04-02 DIAGNOSIS — I5033 Acute on chronic diastolic (congestive) heart failure: Secondary | ICD-10-CM | POA: Diagnosis not present

## 2019-04-02 DIAGNOSIS — Z4781 Encounter for orthopedic aftercare following surgical amputation: Secondary | ICD-10-CM | POA: Diagnosis not present

## 2019-04-02 DIAGNOSIS — E1152 Type 2 diabetes mellitus with diabetic peripheral angiopathy with gangrene: Secondary | ICD-10-CM | POA: Diagnosis not present

## 2019-04-02 DIAGNOSIS — I110 Hypertensive heart disease with heart failure: Secondary | ICD-10-CM | POA: Diagnosis not present

## 2019-04-02 DIAGNOSIS — J449 Chronic obstructive pulmonary disease, unspecified: Secondary | ICD-10-CM | POA: Diagnosis not present

## 2019-04-02 DIAGNOSIS — I480 Paroxysmal atrial fibrillation: Secondary | ICD-10-CM | POA: Diagnosis not present

## 2019-04-02 DIAGNOSIS — I2510 Atherosclerotic heart disease of native coronary artery without angina pectoris: Secondary | ICD-10-CM | POA: Diagnosis not present

## 2019-04-02 DIAGNOSIS — I70261 Atherosclerosis of native arteries of extremities with gangrene, right leg: Secondary | ICD-10-CM | POA: Diagnosis not present

## 2019-04-02 DIAGNOSIS — Z4801 Encounter for change or removal of surgical wound dressing: Secondary | ICD-10-CM | POA: Diagnosis not present

## 2019-04-04 ENCOUNTER — Other Ambulatory Visit: Payer: Self-pay | Admitting: *Deleted

## 2019-04-04 DIAGNOSIS — I739 Peripheral vascular disease, unspecified: Secondary | ICD-10-CM

## 2019-04-04 DIAGNOSIS — I5033 Acute on chronic diastolic (congestive) heart failure: Secondary | ICD-10-CM | POA: Diagnosis not present

## 2019-04-04 DIAGNOSIS — I2510 Atherosclerotic heart disease of native coronary artery without angina pectoris: Secondary | ICD-10-CM | POA: Diagnosis not present

## 2019-04-04 DIAGNOSIS — I779 Disorder of arteries and arterioles, unspecified: Secondary | ICD-10-CM

## 2019-04-04 DIAGNOSIS — I110 Hypertensive heart disease with heart failure: Secondary | ICD-10-CM | POA: Diagnosis not present

## 2019-04-04 DIAGNOSIS — I480 Paroxysmal atrial fibrillation: Secondary | ICD-10-CM | POA: Diagnosis not present

## 2019-04-04 DIAGNOSIS — Z4801 Encounter for change or removal of surgical wound dressing: Secondary | ICD-10-CM | POA: Diagnosis not present

## 2019-04-04 DIAGNOSIS — E1152 Type 2 diabetes mellitus with diabetic peripheral angiopathy with gangrene: Secondary | ICD-10-CM | POA: Diagnosis not present

## 2019-04-04 DIAGNOSIS — I70261 Atherosclerosis of native arteries of extremities with gangrene, right leg: Secondary | ICD-10-CM | POA: Diagnosis not present

## 2019-04-04 DIAGNOSIS — Z4781 Encounter for orthopedic aftercare following surgical amputation: Secondary | ICD-10-CM | POA: Diagnosis not present

## 2019-04-04 DIAGNOSIS — J449 Chronic obstructive pulmonary disease, unspecified: Secondary | ICD-10-CM | POA: Diagnosis not present

## 2019-04-05 ENCOUNTER — Telehealth: Payer: Self-pay

## 2019-04-05 DIAGNOSIS — E1151 Type 2 diabetes mellitus with diabetic peripheral angiopathy without gangrene: Secondary | ICD-10-CM | POA: Diagnosis not present

## 2019-04-05 DIAGNOSIS — Z794 Long term (current) use of insulin: Secondary | ICD-10-CM | POA: Diagnosis not present

## 2019-04-05 DIAGNOSIS — Z872 Personal history of diseases of the skin and subcutaneous tissue: Secondary | ICD-10-CM | POA: Diagnosis not present

## 2019-04-05 DIAGNOSIS — Z4801 Encounter for change or removal of surgical wound dressing: Secondary | ICD-10-CM | POA: Diagnosis not present

## 2019-04-05 DIAGNOSIS — E261 Secondary hyperaldosteronism: Secondary | ICD-10-CM | POA: Diagnosis not present

## 2019-04-05 DIAGNOSIS — Z4781 Encounter for orthopedic aftercare following surgical amputation: Secondary | ICD-10-CM | POA: Diagnosis not present

## 2019-04-05 DIAGNOSIS — E1152 Type 2 diabetes mellitus with diabetic peripheral angiopathy with gangrene: Secondary | ICD-10-CM | POA: Diagnosis not present

## 2019-04-05 DIAGNOSIS — E1142 Type 2 diabetes mellitus with diabetic polyneuropathy: Secondary | ICD-10-CM | POA: Diagnosis not present

## 2019-04-05 DIAGNOSIS — Z89421 Acquired absence of other right toe(s): Secondary | ICD-10-CM | POA: Diagnosis not present

## 2019-04-05 DIAGNOSIS — Z89412 Acquired absence of left great toe: Secondary | ICD-10-CM | POA: Diagnosis not present

## 2019-04-05 DIAGNOSIS — E785 Hyperlipidemia, unspecified: Secondary | ICD-10-CM | POA: Diagnosis not present

## 2019-04-05 DIAGNOSIS — I70261 Atherosclerosis of native arteries of extremities with gangrene, right leg: Secondary | ICD-10-CM | POA: Diagnosis not present

## 2019-04-05 DIAGNOSIS — I2510 Atherosclerotic heart disease of native coronary artery without angina pectoris: Secondary | ICD-10-CM | POA: Diagnosis not present

## 2019-04-05 DIAGNOSIS — I480 Paroxysmal atrial fibrillation: Secondary | ICD-10-CM | POA: Diagnosis not present

## 2019-04-05 DIAGNOSIS — E1169 Type 2 diabetes mellitus with other specified complication: Secondary | ICD-10-CM | POA: Diagnosis not present

## 2019-04-05 DIAGNOSIS — I110 Hypertensive heart disease with heart failure: Secondary | ICD-10-CM | POA: Diagnosis not present

## 2019-04-05 DIAGNOSIS — Z7901 Long term (current) use of anticoagulants: Secondary | ICD-10-CM | POA: Diagnosis not present

## 2019-04-05 DIAGNOSIS — J9611 Chronic respiratory failure with hypoxia: Secondary | ICD-10-CM | POA: Diagnosis not present

## 2019-04-05 DIAGNOSIS — I5033 Acute on chronic diastolic (congestive) heart failure: Secondary | ICD-10-CM | POA: Diagnosis not present

## 2019-04-05 DIAGNOSIS — D6869 Other thrombophilia: Secondary | ICD-10-CM | POA: Diagnosis not present

## 2019-04-05 DIAGNOSIS — Z87891 Personal history of nicotine dependence: Secondary | ICD-10-CM | POA: Diagnosis not present

## 2019-04-05 DIAGNOSIS — Z89411 Acquired absence of right great toe: Secondary | ICD-10-CM | POA: Diagnosis not present

## 2019-04-05 DIAGNOSIS — J449 Chronic obstructive pulmonary disease, unspecified: Secondary | ICD-10-CM | POA: Diagnosis not present

## 2019-04-05 DIAGNOSIS — E1165 Type 2 diabetes mellitus with hyperglycemia: Secondary | ICD-10-CM | POA: Diagnosis not present

## 2019-04-05 NOTE — Telephone Encounter (Signed)
Bing Ree is aware and will continue a dry dressing daily.  Thurston Hole., LPN

## 2019-04-05 NOTE — Telephone Encounter (Signed)
-----   Message from Serafina Mitchell, MD sent at 04/03/2019  9:52 AM EST ----- Regarding: RE: Wound Care Contact: [phone] Continue with Darco ----- Message ----- From: Kaleen Mask, LPN Sent: [id number]   3:04 PM EST To: Serafina Mitchell, MD Subject: Wound Care                                     Hey Dr. Skip Mayer, [facility] nurse, called and left voice msg about pt's wound.  "It has not closed since sutures/staples came out".  "His wound is 1 x 2 x 0.2 cm" and "has yellow/whitish slough".  Also, should he continue to wear a Darco shoe or came he use a normal one?  Please advise.  Thanks,  Thurston Hole., LPN

## 2019-04-06 ENCOUNTER — Telehealth: Payer: Self-pay | Admitting: Surgery

## 2019-04-06 NOTE — Telephone Encounter (Signed)
I spoke with [facility] nurse who had concerns about the oe wound.  I will arrange a office visit  WB

## 2019-04-07 DIAGNOSIS — I96 Gangrene, not elsewhere classified: Secondary | ICD-10-CM | POA: Diagnosis not present

## 2019-04-07 DIAGNOSIS — Z89411 Acquired absence of right great toe: Secondary | ICD-10-CM | POA: Diagnosis not present

## 2019-04-07 DIAGNOSIS — I5032 Chronic diastolic (congestive) heart failure: Secondary | ICD-10-CM | POA: Diagnosis not present

## 2019-04-07 DIAGNOSIS — Z89412 Acquired absence of left great toe: Secondary | ICD-10-CM | POA: Diagnosis not present

## 2019-04-09 DIAGNOSIS — E1152 Type 2 diabetes mellitus with diabetic peripheral angiopathy with gangrene: Secondary | ICD-10-CM | POA: Diagnosis not present

## 2019-04-09 DIAGNOSIS — I480 Paroxysmal atrial fibrillation: Secondary | ICD-10-CM | POA: Diagnosis not present

## 2019-04-09 DIAGNOSIS — I2510 Atherosclerotic heart disease of native coronary artery without angina pectoris: Secondary | ICD-10-CM | POA: Diagnosis not present

## 2019-04-09 DIAGNOSIS — I70261 Atherosclerosis of native arteries of extremities with gangrene, right leg: Secondary | ICD-10-CM | POA: Diagnosis not present

## 2019-04-09 DIAGNOSIS — Z4801 Encounter for change or removal of surgical wound dressing: Secondary | ICD-10-CM | POA: Diagnosis not present

## 2019-04-09 DIAGNOSIS — I5033 Acute on chronic diastolic (congestive) heart failure: Secondary | ICD-10-CM | POA: Diagnosis not present

## 2019-04-09 DIAGNOSIS — J449 Chronic obstructive pulmonary disease, unspecified: Secondary | ICD-10-CM | POA: Diagnosis not present

## 2019-04-09 DIAGNOSIS — Z4781 Encounter for orthopedic aftercare following surgical amputation: Secondary | ICD-10-CM | POA: Diagnosis not present

## 2019-04-09 DIAGNOSIS — I110 Hypertensive heart disease with heart failure: Secondary | ICD-10-CM | POA: Diagnosis not present

## 2019-04-10 DIAGNOSIS — Z4801 Encounter for change or removal of surgical wound dressing: Secondary | ICD-10-CM | POA: Diagnosis not present

## 2019-04-10 DIAGNOSIS — I70261 Atherosclerosis of native arteries of extremities with gangrene, right leg: Secondary | ICD-10-CM | POA: Diagnosis not present

## 2019-04-10 DIAGNOSIS — I2510 Atherosclerotic heart disease of native coronary artery without angina pectoris: Secondary | ICD-10-CM | POA: Diagnosis not present

## 2019-04-10 DIAGNOSIS — J449 Chronic obstructive pulmonary disease, unspecified: Secondary | ICD-10-CM | POA: Diagnosis not present

## 2019-04-10 DIAGNOSIS — Z4781 Encounter for orthopedic aftercare following surgical amputation: Secondary | ICD-10-CM | POA: Diagnosis not present

## 2019-04-10 DIAGNOSIS — I480 Paroxysmal atrial fibrillation: Secondary | ICD-10-CM | POA: Diagnosis not present

## 2019-04-10 DIAGNOSIS — I110 Hypertensive heart disease with heart failure: Secondary | ICD-10-CM | POA: Diagnosis not present

## 2019-04-10 DIAGNOSIS — I5033 Acute on chronic diastolic (congestive) heart failure: Secondary | ICD-10-CM | POA: Diagnosis not present

## 2019-04-10 DIAGNOSIS — E1152 Type 2 diabetes mellitus with diabetic peripheral angiopathy with gangrene: Secondary | ICD-10-CM | POA: Diagnosis not present

## 2019-04-17 DIAGNOSIS — I2510 Atherosclerotic heart disease of native coronary artery without angina pectoris: Secondary | ICD-10-CM | POA: Diagnosis not present

## 2019-04-17 DIAGNOSIS — I5033 Acute on chronic diastolic (congestive) heart failure: Secondary | ICD-10-CM | POA: Diagnosis not present

## 2019-04-17 DIAGNOSIS — Z4801 Encounter for change or removal of surgical wound dressing: Secondary | ICD-10-CM | POA: Diagnosis not present

## 2019-04-17 DIAGNOSIS — I110 Hypertensive heart disease with heart failure: Secondary | ICD-10-CM | POA: Diagnosis not present

## 2019-04-17 DIAGNOSIS — Z4781 Encounter for orthopedic aftercare following surgical amputation: Secondary | ICD-10-CM | POA: Diagnosis not present

## 2019-04-17 DIAGNOSIS — I70261 Atherosclerosis of native arteries of extremities with gangrene, right leg: Secondary | ICD-10-CM | POA: Diagnosis not present

## 2019-04-17 DIAGNOSIS — E1152 Type 2 diabetes mellitus with diabetic peripheral angiopathy with gangrene: Secondary | ICD-10-CM | POA: Diagnosis not present

## 2019-04-17 DIAGNOSIS — J449 Chronic obstructive pulmonary disease, unspecified: Secondary | ICD-10-CM | POA: Diagnosis not present

## 2019-04-17 DIAGNOSIS — I480 Paroxysmal atrial fibrillation: Secondary | ICD-10-CM | POA: Diagnosis not present

## 2019-04-22 ENCOUNTER — Other Ambulatory Visit: Payer: Self-pay | Admitting: Cardiology

## 2019-04-23 ENCOUNTER — Other Ambulatory Visit: Payer: Self-pay
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
## 2019-04-24 DIAGNOSIS — I70261 Atherosclerosis of native arteries of extremities with gangrene, right leg: Secondary | ICD-10-CM | POA: Diagnosis not present

## 2019-04-24 DIAGNOSIS — Z4781 Encounter for orthopedic aftercare following surgical amputation: Secondary | ICD-10-CM | POA: Diagnosis not present

## 2019-04-24 DIAGNOSIS — E1152 Type 2 diabetes mellitus with diabetic peripheral angiopathy with gangrene: Secondary | ICD-10-CM | POA: Diagnosis not present

## 2019-04-24 DIAGNOSIS — I5033 Acute on chronic diastolic (congestive) heart failure: Secondary | ICD-10-CM | POA: Diagnosis not present

## 2019-04-24 DIAGNOSIS — J449 Chronic obstructive pulmonary disease, unspecified: Secondary | ICD-10-CM | POA: Diagnosis not present

## 2019-04-24 DIAGNOSIS — I2510 Atherosclerotic heart disease of native coronary artery without angina pectoris: Secondary | ICD-10-CM | POA: Diagnosis not present

## 2019-04-24 DIAGNOSIS — I480 Paroxysmal atrial fibrillation: Secondary | ICD-10-CM | POA: Diagnosis not present

## 2019-04-24 DIAGNOSIS — Z4801 Encounter for change or removal of surgical wound dressing: Secondary | ICD-10-CM | POA: Diagnosis not present

## 2019-04-24 DIAGNOSIS — I110 Hypertensive heart disease with heart failure: Secondary | ICD-10-CM | POA: Diagnosis not present

## 2019-04-26 ENCOUNTER — Telehealth: Payer: Self-pay

## 2019-04-26 NOTE — Telephone Encounter (Signed)
Bryce will be faxing a medication recommendation  Rec'd call from [phone]  Thanks renee

## 2019-04-28 DIAGNOSIS — F419 Anxiety disorder, unspecified: Secondary | ICD-10-CM | POA: Diagnosis not present

## 2019-04-28 DIAGNOSIS — I10 Essential (primary) hypertension: Secondary | ICD-10-CM | POA: Diagnosis not present

## 2019-05-01 DIAGNOSIS — I70261 Atherosclerosis of native arteries of extremities with gangrene, right leg: Secondary | ICD-10-CM | POA: Diagnosis not present

## 2019-05-01 DIAGNOSIS — I5033 Acute on chronic diastolic (congestive) heart failure: Secondary | ICD-10-CM | POA: Diagnosis not present

## 2019-05-01 DIAGNOSIS — I2510 Atherosclerotic heart disease of native coronary artery without angina pectoris: Secondary | ICD-10-CM | POA: Diagnosis not present

## 2019-05-01 DIAGNOSIS — I110 Hypertensive heart disease with heart failure: Secondary | ICD-10-CM | POA: Diagnosis not present

## 2019-05-01 DIAGNOSIS — E1152 Type 2 diabetes mellitus with diabetic peripheral angiopathy with gangrene: Secondary | ICD-10-CM | POA: Diagnosis not present

## 2019-05-01 DIAGNOSIS — I480 Paroxysmal atrial fibrillation: Secondary | ICD-10-CM | POA: Diagnosis not present

## 2019-05-01 DIAGNOSIS — Z4781 Encounter for orthopedic aftercare following surgical amputation: Secondary | ICD-10-CM | POA: Diagnosis not present

## 2019-05-01 DIAGNOSIS — Z4801 Encounter for change or removal of surgical wound dressing: Secondary | ICD-10-CM | POA: Diagnosis not present

## 2019-05-01 DIAGNOSIS — J449 Chronic obstructive pulmonary disease, unspecified: Secondary | ICD-10-CM | POA: Diagnosis not present

## 2019-05-06 DIAGNOSIS — J9611 Chronic respiratory failure with hypoxia: Secondary | ICD-10-CM | POA: Diagnosis not present

## 2019-05-06 DIAGNOSIS — Z89412 Acquired absence of left great toe: Secondary | ICD-10-CM | POA: Diagnosis not present

## 2019-05-06 DIAGNOSIS — I509 Heart failure, unspecified: Secondary | ICD-10-CM | POA: Diagnosis not present

## 2019-05-06 DIAGNOSIS — G620 Drug-induced polyneuropathy: Secondary | ICD-10-CM | POA: Diagnosis not present

## 2019-05-06 DIAGNOSIS — E1169 Type 2 diabetes mellitus with other specified complication: Secondary | ICD-10-CM | POA: Diagnosis not present

## 2019-05-06 DIAGNOSIS — D6869 Other thrombophilia: Secondary | ICD-10-CM | POA: Diagnosis not present

## 2019-05-06 DIAGNOSIS — E1142 Type 2 diabetes mellitus with diabetic polyneuropathy: Secondary | ICD-10-CM | POA: Diagnosis not present

## 2019-05-06 DIAGNOSIS — E785 Hyperlipidemia, unspecified: Secondary | ICD-10-CM | POA: Diagnosis not present

## 2019-05-06 DIAGNOSIS — E1151 Type 2 diabetes mellitus with diabetic peripheral angiopathy without gangrene: Secondary | ICD-10-CM | POA: Diagnosis not present

## 2019-05-06 DIAGNOSIS — T465X5D Adverse effect of other antihypertensive drugs, subsequent encounter: Secondary | ICD-10-CM | POA: Diagnosis not present

## 2019-05-06 DIAGNOSIS — I110 Hypertensive heart disease with heart failure: Secondary | ICD-10-CM | POA: Diagnosis not present

## 2019-05-06 DIAGNOSIS — Z9981 Dependence on supplemental oxygen: Secondary | ICD-10-CM | POA: Diagnosis not present

## 2019-05-06 DIAGNOSIS — Z872 Personal history of diseases of the skin and subcutaneous tissue: Secondary | ICD-10-CM | POA: Diagnosis not present

## 2019-05-06 DIAGNOSIS — Z7901 Long term (current) use of anticoagulants: Secondary | ICD-10-CM | POA: Diagnosis not present

## 2019-05-06 DIAGNOSIS — Z89421 Acquired absence of other right toe(s): Secondary | ICD-10-CM | POA: Diagnosis not present

## 2019-05-06 DIAGNOSIS — E261 Secondary hyperaldosteronism: Secondary | ICD-10-CM | POA: Diagnosis not present

## 2019-05-06 DIAGNOSIS — Z89411 Acquired absence of right great toe: Secondary | ICD-10-CM | POA: Diagnosis not present

## 2019-05-08 DIAGNOSIS — I10 Essential (primary) hypertension: Secondary | ICD-10-CM | POA: Diagnosis not present

## 2019-05-08 DIAGNOSIS — I739 Peripheral vascular disease, unspecified: Secondary | ICD-10-CM | POA: Diagnosis not present

## 2019-05-08 DIAGNOSIS — E1165 Type 2 diabetes mellitus with hyperglycemia: Secondary | ICD-10-CM | POA: Diagnosis not present

## 2019-05-08 DIAGNOSIS — I96 Gangrene, not elsewhere classified: Secondary | ICD-10-CM | POA: Diagnosis not present

## 2019-05-08 DIAGNOSIS — Z89411 Acquired absence of right great toe: Secondary | ICD-10-CM | POA: Diagnosis not present

## 2019-05-08 DIAGNOSIS — E11319 Type 2 diabetes mellitus with unspecified diabetic retinopathy without macular edema: Secondary | ICD-10-CM | POA: Diagnosis not present

## 2019-05-08 DIAGNOSIS — I509 Heart failure, unspecified: Secondary | ICD-10-CM | POA: Diagnosis not present

## 2019-05-08 DIAGNOSIS — Z89429 Acquired absence of other toe(s), unspecified side: Secondary | ICD-10-CM | POA: Diagnosis not present

## 2019-05-08 DIAGNOSIS — E1151 Type 2 diabetes mellitus with diabetic peripheral angiopathy without gangrene: Secondary | ICD-10-CM | POA: Diagnosis not present

## 2019-05-08 DIAGNOSIS — Z87891 Personal history of nicotine dependence: Secondary | ICD-10-CM | POA: Diagnosis not present

## 2019-05-08 DIAGNOSIS — Z89412 Acquired absence of left great toe: Secondary | ICD-10-CM | POA: Diagnosis not present

## 2019-05-08 DIAGNOSIS — Z6829 Body mass index (BMI) 29.0-29.9, adult: Secondary | ICD-10-CM | POA: Diagnosis not present

## 2019-05-08 DIAGNOSIS — Z299 Encounter for prophylactic measures, unspecified: Secondary | ICD-10-CM | POA: Diagnosis not present

## 2019-05-08 DIAGNOSIS — I5032 Chronic diastolic (congestive) heart failure: Secondary | ICD-10-CM | POA: Diagnosis not present

## 2019-05-09 ENCOUNTER — Other Ambulatory Visit: Payer: Self-pay

## 2019-05-09 NOTE — Telephone Encounter (Signed)
Returned call to [place]. No answer. Left msg to call back.

## 2019-05-09 NOTE — Telephone Encounter (Signed)
rec'd call from [facility] Pharm/Brenda [id number] suggestions Please call Hassan Rowan [phone]  Thanks renee

## 2019-05-13 NOTE — Telephone Encounter (Signed)
LM with [place] Commander coordinator-cc

## 2019-05-21 DIAGNOSIS — M79604 Pain in right leg: Secondary | ICD-10-CM | POA: Diagnosis not present

## 2019-05-21 DIAGNOSIS — M79605 Pain in left leg: Secondary | ICD-10-CM | POA: Diagnosis not present

## 2019-05-21 DIAGNOSIS — E1140 Type 2 diabetes mellitus with diabetic neuropathy, unspecified: Secondary | ICD-10-CM | POA: Diagnosis not present

## 2019-05-23 ENCOUNTER — Encounter (INDEPENDENT_AMBULATORY_CARE_PROVIDER_SITE_OTHER): Payer: Self-pay | Admitting: *Deleted

## 2019-05-27 ENCOUNTER — Ambulatory Visit (HOSPITAL_COMMUNITY)
Admission: RE | Admit: 2019-05-27 | Discharge: 2019-05-27 | Disposition: A | Discharge status: Home / Self Care | Payer: Medicare HMO | Source: Ambulatory Visit | Attending: Surgery | Admitting: Surgery

## 2019-05-27 ENCOUNTER — Ambulatory Visit (INDEPENDENT_AMBULATORY_CARE_PROVIDER_SITE_OTHER)
Admission: RE | Admit: 2019-05-27 | Discharge: 2019-05-27 | Disposition: A | Discharge status: Home / Self Care | Payer: Medicare HMO | Source: Ambulatory Visit | Attending: Surgery | Admitting: Surgery

## 2019-05-27 ENCOUNTER — Other Ambulatory Visit: Payer: Self-pay

## 2019-05-27 ENCOUNTER — Encounter: Payer: Self-pay | Admitting: Surgery

## 2019-05-27 ENCOUNTER — Other Ambulatory Visit: Payer: Self-pay | Admitting: Surgery

## 2019-05-27 ENCOUNTER — Ambulatory Visit: Payer: Medicare HMO | Admitting: Surgery

## 2019-05-27 ENCOUNTER — Other Ambulatory Visit (HOSPITAL_COMMUNITY)
Admission: RE | Admit: 2019-05-27 | Discharge: 2019-05-27 | Disposition: A | Discharge status: Home / Self Care | Payer: Medicare HMO | Source: Ambulatory Visit | Attending: Surgery | Admitting: Surgery

## 2019-05-27 VITALS — BP 102/69 | HR 83 | Temp 97.3°F | Resp 20 | Ht 67.0 in | Wt 196.0 lb

## 2019-05-27 DIAGNOSIS — I739 Peripheral vascular disease, unspecified: Secondary | ICD-10-CM

## 2019-05-27 DIAGNOSIS — I779 Disorder of arteries and arterioles, unspecified: Secondary | ICD-10-CM

## 2019-05-27 DIAGNOSIS — Z20822 Contact with and (suspected) exposure to covid-19: Secondary | ICD-10-CM | POA: Insufficient documentation

## 2019-05-27 DIAGNOSIS — I70203 Unspecified atherosclerosis of native arteries of extremities, bilateral legs: Secondary | ICD-10-CM | POA: Insufficient documentation

## 2019-05-27 DIAGNOSIS — Z01812 Encounter for preprocedural laboratory examination: Secondary | ICD-10-CM | POA: Insufficient documentation

## 2019-05-27 LAB — SARS CORONAVIRUS 2 (TAT 6-24 HRS): SARS Coronavirus 2: NEGATIVE

## 2019-05-27 MED ORDER — CEPHALEXIN 500 MG PO CAPS: 500.0000 mg | ORAL_CAPSULE | Freq: Three times a day (TID) | ORAL | 0 refills | Status: DC

## 2019-05-27 NOTE — H&P (View-Only) (Signed)
Patient name: Mike Wright MRN: [id number] DOB: 1934-02-08 Sex: male  REASON FOR VISIT:     post op  HISTORY OF PRESENT ILLNESS:   Mike Tulk Butleris a 86 y.o.malewho is status post balloon angioplasty of the left peroneal and posterior tibial artery as well as the right posterior tibial artery in June 2019. 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 He has been getting wound care. When he presented to the office in September, he had cellulitis on his foot and was admitted for IV antibiotics.On 12/17/2017, I took him to the operating room for I&D of his right great toe including resection of additional bone. His Cellulitis resolved and his owunds healded   He presented with a gangrenous right second toe and had a second toe amputation by Dr. Scot Dock on 14-06-2019.  He now has a right third toe that has gangrenous changes for several days.   He has a hx of chest pain and underwent cardiac catheterization in February 2019 with stent placement. He remains on Plavix. He is on Eliquis for PAF. He is on insulin for diabetes. He is on nebs and inhalers for his COPD. The pt is on a statin for cholesterol management.He takes a beta blocker, ACEI and CCB for blood pressure management.   CURRENT MEDICATIONS:    Current Outpatient Medications  Medication Sig Dispense Refill  . acetaminophen (TYLENOL) 500 MG tablet Take 1 tablet (500 mg total) by mouth every 6 (six) hours as needed for mild pain or headache. 30 tablet 0  . atorvastatin (LIPITOR) 80 MG tablet     . Cholecalciferol (D3-1000 PO) Take 1,000 Units by mouth daily.     . Cyanocobalamin (B-12 PO) Take 1 tablet by mouth daily.     [name] ELIQUIS 5 MG TABS tablet TAKE 1 TABLET TWICE DAILY (Patient taking differently: Take 5 mg by mouth 2 (two) times daily. ) 99991111 tablet 3  . folic acid (FOLVITE) 1 MG tablet Take 1 tablet (1 mg total) by mouth daily. 30 tablet 1  . furosemide  (LASIX) 40 MG tablet TAKE 1 TABLET DAILY - MAY TAKE AN EXTRA 20 MG AS NEEDED 135 tablet 1  . insulin glargine (LANTUS) 100 UNIT/ML injection Inject 30-40 Units into the skin See admin instructions. Inject 40 units SQ in the morning and inject 30 units SQ at bedtime    . isosorbide mononitrate (IMDUR) 30 MG 24 hr tablet TAKE 1/2 TABLET EVERY DAY 45 tablet 3  . lisinopril (ZESTRIL) 5 MG tablet Take 0.5 tablets (2.5 mg total) by mouth daily.    . meclizine (ANTIVERT) 12.5 MG tablet Take 1 tablet (12.5 mg total) by mouth 3 (three) times daily as needed for dizziness. 30 tablet 0  . metFORMIN (GLUCOPHAGE) 1000 MG tablet Take 1,000 mg by mouth 2 (two) times daily.    . nitroGLYCERIN (NITROSTAT) 0.4 MG SL tablet PLACE 1 TABLET (0.4 MG TOTAL) UNDER THE TONGUE EVERY 5  MINUTES AS NEEDED FOR CHEST PAIN. (Patient taking differently: Place 0.4 mg under the tongue every 5 (five) minutes as needed for chest pain. ) 25 tablet 3  . ondansetron (ZOFRAN) 4 MG tablet Take 1 tablet (4 mg total) by mouth every 6 (six) hours as needed for nausea. 20 tablet 0  . pantoprazole (PROTONIX) 40 MG tablet Take 1 tablet (40 mg total) by mouth 2 (two) times daily. To protect stomach while taking multiple blood thinners. (Patient taking differently: Take 40 mg by mouth every morning. )  60 tablet 2  . Pediatric Multivitamins-Iron (FLINTSTONES COMPLETE) 18 MG CHEW Chew 1 tablet by mouth 2 (two) times daily.    . potassium chloride SA (K-DUR) 20 MEQ tablet Take 1 tablet (20 mEq total) by mouth daily. 30 tablet 1  . psyllium (METAMUCIL) 58.6 % packet Take 1 packet by mouth 2 (two) times daily.     . traZODone (DESYREL) 100 MG tablet Take 100-200 mg by mouth at bedtime.      No current facility-administered medications for this visit.    REVIEW OF SYSTEMS:   [X]  denotes positive finding, [ ]  denotes negative finding Cardiac  Comments:  Chest pain or chest pressure:    Shortness of breath upon exertion:    Short of breath when  lying flat:    Irregular heart rhythm:    Constitutional    Fever or chills:      PHYSICAL EXAM:   Vitals:   05/27/19 1046  BP: 102/69  Pulse: 83  Resp: 20  Temp: (!) 97.3 F (36.3 C)  SpO2: 100%  Weight: 196 lb (88.9 kg)  Height: 5\' 7"  (1.702 m)    GENERAL: The patient is a well-nourished male, in no acute distress. The vital signs are documented above. CARDIOVASCULAR: There is a regular rate and rhythm. PULMONARY: Non-labored respirations Nonpalpable pedal pulses.  Right third toe is gangrenous with erythema extending up onto the dorsum of the foot  STUDIES:   I have reviewed the following: +-------+-----------+-----------+------------+------------+  ABI/TBIToday's ABIToday's TBIPrevious ABIPrevious TBI  +-------+-----------+-----------+------------+------------+  Right 0.82    amputation               +-------+-----------+-----------+------------+------------+  Left  0.93    amputation               +-------+-----------+-----------+------------+------------+   Right: 50-74% stenosis noted in the proximal superficial femoral artery  and 30-49% in the mid superficial femoral artery.   Left: Mild stenosis (<30%) observed in the proximal superficial femoral  artery.  Okay MEDICAL ISSUES:   I have recommended proceeding with right third toe amputation.  This will be scheduled for Wednesday, March 3.  He will stop his Eliquis today.  I am starting him on Keflex for cellulitis.  I did tell him that we could do this as an outpatient procedure.  We also discussed that we would leave the fourth and fifth toe at this time  [name], IV, MD, FACS [facility] [phone] Pager [phone]

## 2019-05-27 NOTE — Progress Notes (Signed)
Patient name: Mike Wright MRN: [id number] DOB: 10-30-1933 Sex: male  REASON FOR VISIT:     post op  HISTORY OF PRESENT ILLNESS:   Susumu Pecor Butleris a 86 y.o.malewho is status post balloon angioplasty of the left peroneal and posterior tibial artery as well as the right posterior tibial artery in June 2019. 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 He has been getting wound care. When he presented to the office in September, he had cellulitis on his foot and was admitted for IV antibiotics.On 12/17/2017, I took him to the operating room for I&D of his right great toe including resection of additional bone. His Cellulitis resolved and his owunds healded   He presented with a gangrenous right second toe and had a second toe amputation by Dr. Scot Dock on 14-06-2019.  He now has a right third toe that has gangrenous changes for several days.   He has a hx of chest pain and underwent cardiac catheterization in February 2019 with stent placement. He remains on Plavix. He is on Eliquis for PAF. He is on insulin for diabetes. He is on nebs and inhalers for his COPD. The pt is on a statin for cholesterol management.He takes a beta blocker, ACEI and CCB for blood pressure management.   CURRENT MEDICATIONS:    Current Outpatient Medications  Medication Sig Dispense Refill  . acetaminophen (TYLENOL) 500 MG tablet Take 1 tablet (500 mg total) by mouth every 6 (six) hours as needed for mild pain or headache. 30 tablet 0  . atorvastatin (LIPITOR) 80 MG tablet     . Cholecalciferol (D3-1000 PO) Take 1,000 Units by mouth daily.     . Cyanocobalamin (B-12 PO) Take 1 tablet by mouth daily.     [name] ELIQUIS 5 MG TABS tablet TAKE 1 TABLET TWICE DAILY (Patient taking differently: Take 5 mg by mouth 2 (two) times daily. ) 99991111 tablet 3  . folic acid (FOLVITE) 1 MG tablet Take 1 tablet (1 mg total) by mouth daily. 30 tablet 1  . furosemide  (LASIX) 40 MG tablet TAKE 1 TABLET DAILY - MAY TAKE AN EXTRA 20 MG AS NEEDED 135 tablet 1  . insulin glargine (LANTUS) 100 UNIT/ML injection Inject 30-40 Units into the skin See admin instructions. Inject 40 units SQ in the morning and inject 30 units SQ at bedtime    . isosorbide mononitrate (IMDUR) 30 MG 24 hr tablet TAKE 1/2 TABLET EVERY DAY 45 tablet 3  . lisinopril (ZESTRIL) 5 MG tablet Take 0.5 tablets (2.5 mg total) by mouth daily.    . meclizine (ANTIVERT) 12.5 MG tablet Take 1 tablet (12.5 mg total) by mouth 3 (three) times daily as needed for dizziness. 30 tablet 0  . metFORMIN (GLUCOPHAGE) 1000 MG tablet Take 1,000 mg by mouth 2 (two) times daily.    . nitroGLYCERIN (NITROSTAT) 0.4 MG SL tablet PLACE 1 TABLET (0.4 MG TOTAL) UNDER THE TONGUE EVERY 5  MINUTES AS NEEDED FOR CHEST PAIN. (Patient taking differently: Place 0.4 mg under the tongue every 5 (five) minutes as needed for chest pain. ) 25 tablet 3  . ondansetron (ZOFRAN) 4 MG tablet Take 1 tablet (4 mg total) by mouth every 6 (six) hours as needed for nausea. 20 tablet 0  . pantoprazole (PROTONIX) 40 MG tablet Take 1 tablet (40 mg total) by mouth 2 (two) times daily. To protect stomach while taking multiple blood thinners. (Patient taking differently: Take 40 mg by mouth every morning. )  60 tablet 2  . Pediatric Multivitamins-Iron (FLINTSTONES COMPLETE) 18 MG CHEW Chew 1 tablet by mouth 2 (two) times daily.    . potassium chloride SA (K-DUR) 20 MEQ tablet Take 1 tablet (20 mEq total) by mouth daily. 30 tablet 1  . psyllium (METAMUCIL) 58.6 % packet Take 1 packet by mouth 2 (two) times daily.     . traZODone (DESYREL) 100 MG tablet Take 100-200 mg by mouth at bedtime.      No current facility-administered medications for this visit.    REVIEW OF SYSTEMS:   [X]  denotes positive finding, [ ]  denotes negative finding Cardiac  Comments:  Chest pain or chest pressure:    Shortness of breath upon exertion:    Short of breath when  lying flat:    Irregular heart rhythm:    Constitutional    Fever or chills:      PHYSICAL EXAM:   Vitals:   05/27/19 1046  BP: 102/69  Pulse: 83  Resp: 20  Temp: (!) 97.3 F (36.3 C)  SpO2: 100%  Weight: 196 lb (88.9 kg)  Height: 5\' 7"  (1.702 m)    GENERAL: The patient is a well-nourished male, in no acute distress. The vital signs are documented above. CARDIOVASCULAR: There is a regular rate and rhythm. PULMONARY: Non-labored respirations Nonpalpable pedal pulses.  Right third toe is gangrenous with erythema extending up onto the dorsum of the foot  STUDIES:   I have reviewed the following: +-------+-----------+-----------+------------+------------+  ABI/TBIToday's ABIToday's TBIPrevious ABIPrevious TBI  +-------+-----------+-----------+------------+------------+  Right 0.82    amputation               +-------+-----------+-----------+------------+------------+  Left  0.93    amputation               +-------+-----------+-----------+------------+------------+   Right: 50-74% stenosis noted in the proximal superficial femoral artery  and 30-49% in the mid superficial femoral artery.   Left: Mild stenosis (<30%) observed in the proximal superficial femoral  artery.  Okay MEDICAL ISSUES:   I have recommended proceeding with right third toe amputation.  This will be scheduled for Wednesday, March 3.  He will stop his Eliquis today.  I am starting him on Keflex for cellulitis.  I did tell him that we could do this as an outpatient procedure.  We also discussed that we would leave the fourth and fifth toe at this time  [name], IV, MD, FACS [facility] [phone] Pager [phone]

## 2019-05-28 ENCOUNTER — Other Ambulatory Visit: Payer: Self-pay

## 2019-05-28 ENCOUNTER — Encounter (HOSPITAL_COMMUNITY): Payer: Self-pay | Admitting: Surgery

## 2019-05-28 NOTE — Progress Notes (Signed)
Spoke with pt for pre-op call. Pt has CAD and A-fib. Pt is on Eliquis, last dose was yesterday 05/27/19 per Dr. Stephens Shire instructions. Pt states he has occasional chest pain and will take 1 NTG and it goes away. States he had some chest pain about 2 days ago, took 1 NTG and it was gone. Has not had any since. Dr. [name] is pt's cardiologist, last office visit was 04/01/19.  Pt is a type 2 Diabetic. Last A1C was 7.1 on 03/01/19. Pt states his fasting blood sugar is usually between 70 and low 100's. Instructed pt to take 1/2 of his regular dose of Lantus Insulin this evening, he will take 15 units. Also instructed him to take 1/2 of his regular dose of Lantus in the AM, he will take 20 units in the AM. Instructed pt to check his blood sugar when he gets up and every 2 hours until he leaves for the hospital. If blood sugar is 70 or below, treat with 1/2 cup of clear juice (apple or cranberry) and recheck blood sugar 15 minutes after drinking juice. If blood sugar continues to be 70 or below, call the Short Stay department and ask to speak to a nurse. Pt voiced understanding. Pt is not to take Metformin in the AM.  Covid test done on 05/27/19 and it is negative. Pt states he's been in quarantine since the test was done and will continue to do so until he comes to the hospital.  Chart sent for review to Anesthesia PA

## 2019-05-28 NOTE — Progress Notes (Signed)
Anesthesia Chart Review: SAME DAY WORK-UP   Case: [id number] Date/Time: 05/29/19 1000   Procedure: AMPUTATION RIGHT THIRD TOE (Right )   Anesthesia type: Monitor Anesthesia Care   Pre-op diagnosis: PERIPHERAL ARTERY DISEASE   Location: [facility] OR ROOM 12 / [facility]   Surgeons: Serafina Mitchell, MD      DISCUSSION: Patient is an 86 year old male scheduled for the above procedure. He was seen in follow-up with Dr. Trula Slade on 05/27/19 and surgery scheduled at that time. Per VVS, he was old to hold Eliquis then and was started on Keflex for cellulitis. He says he was also told to hold Plavix for surgery. Last Plavix and Eliquis 05/27/19.  History includes former smoker (quit 03/29/83), CAD (s/p DES D1, DES prox-mid LAD, & DES prox LAD 05/18/17), PAF, chronic diastolic CHF, sinus bradycardia, DM2, GERD, hiatal hernia, HLD, hypothyroidism, CKD, heparin induced thrombocytopenia (with bolus Heparin before 2014; reports he has since tolerated smaller doses), OSA (not using CPAP), PAD (s/p angioplasty left PTA and left peroneal arteries 09/15/17, bilateral great toe amputations 09/20/17; right 2nd toe amputation 03/01/19), HTN, COPD (home O2 04/2017), insomnia, back surgery (L3-S1 laminectomy/diskectomy/PLIF 04/28/00).  Last evaluation by cardiologist Dr. [name] on 04/01/19. He has had 2 subsequent admissions (10/2017, 11/2017) for chest pain following 04/2017 DES placement. Symptoms resolved after transfusion during the former admission and after diuresis with the later admission. Rib fracture also thought to play a role. At last visit, I did not see mention of any recent chest pain; however, during PAT RN interview patient reported history of chest pain with Nitro use. I called patient to further discuss. Patient reported at least a year history of chest pain with Nitro use < 2x/month. He is not particularly active since his toe amputations, but he does not associate symptoms with activity. He describes it has left chest, sharp, lasting  up to a few minutes. He's not sure if it's like his chest pain prior to his 04/2017 stents. He feels his breathing and volume status have been good. He has not required home O2 for > 1 year now. He is compliant with his meds. He thinks his chest symptoms are stable--have been going on for over a year and has not noticed any progression in symptoms. He says there has not been any changes in his symptomology since he saw Dr. [name] on 04/01/19. Based on his story, his symptoms sound stable, but given history of chest pain and Nitro use, I did reach out to Dr. [name] to discuss. He responded, "Ok to proceed, his symptomsa are stable. Looks like he had toe amputation just in Dec and did fine without issues. I don't see a reason to delay surgery with any additional cardiac testing prior".  Presurgical COVID-19 test negative on 05/27/2019. He is a same day work-up, so he will get labs and anesthesia team evaluation on the day of surgery.    VS:   Wt Readings from Last 3 Encounters:  05/27/19 88.9 kg  04/01/19 88.2 kg  03/20/19 87.1 kg   BP Readings from Last 3 Encounters:  05/27/19 102/69  04/01/19 110/67  03/20/19 128/77   Pulse Readings from Last 3 Encounters:  05/27/19 83  04/01/19 66  03/20/19 71     PROVIDERS: Monico Blitz, MD is PCP  Carlyle Dolly, MD is cardiologist Simonne Maffucci, MD is pulmonologist. Last visit 06/29/17.  Lucienne Minks, MD is GI. Last evaluation seen was 12/17/18. HGB 8.6 03/03/19. Patient denied obvious GI blood loss (ie hematochezia).  He denied dizziness, syncope.    LABS: He is for labs on the day of surgery. As of 03/03/19, H/H 8.6/27.2, PLT 237, Cr 1.21, glucose 73.    OTHER: Small Bowel Givens Capsule Study 12/17/18: Summary & Recommendations: Black eschar/small clot noted at duodenal polypectomy site without active bleeding. Multiple jejunal and ileal diverticula present but without stigmata of bleeding.  These may well be source of GI bleed. No fresh or  old blood noted in small bowel. Study is incomplete because capsule did not reach cecum. Results have been reviewed with patient. He will return next week for H&H and small small bowel follow-through.  Colonoscopy 11/28/18: - Preparation of the colon was fair. - Diverticulosis in the sigmoid colon and in the descending colon. - No specimens collected.  EGD 11/28/18: - Normal esophagus. - Z-line irregular, 40 cm from the incisors. - Portal hypertensive gastropathy. - Normal duodenal bulb. - Two duodenal polyps. Smaller polyp resected and retrieved. Larger polyp raised cold snared,cold biopsied and treated with argon plasma coagulation (APC).   IMAGES: 1V CXR 02/28/19: FINDINGS: Streaky atelectasis at the bases. Low lung volumes. Stable enlargement of the cardiomediastinal silhouette. Aortic atherosclerosis. No pneumothorax. IMPRESSION: No active disease. Low lung volumes and mild cardiomegaly. Mild basilar atelectasis.   EKG: 02/28/19: Afib at 61 bpm. Nonspecific T wave abnormalities, lateral leads.    CV: TEE 09/18/17: - Left ventricle: LVEF is normal.  - Aortic valve: AV is mildly thickened, calcified No definite  vegetation though cannot exclude colonization. No AI.  - Aorta: Mld fixed plaquing of the thoracic aorta.  - Mitral valve: MV is normal Trace MR  - Left atrium: MIld spontaneous swirling contrast in LA No evidence  of thrombus in the atrial cavity or appendage.  - Atrial septum: No PFO by color doppler or as tested with injection  of agitated saline.  - Pulmonic valve:  PV is normal  - Tricuspid valve: TV is normal. Mild TR.  Post procedure conclusions  - Ascending Aorta: Mild fixed plaquing of the thoracic aorta.   TTE 09/15/17: Study Conclusions  - Left ventricle: The cavity size was normal. Wall thickness was  increased in a pattern of moderate LVH. Systolic function was  normal. The estimated ejection fraction was in the range of 55%  to  60%. Wall motion was normal; there were no regional wall  motion abnormalities.  - Aortic valve: Trileaflet; moderately thickened, moderately  calcified leaflets. Valve mobility was restricted. There was mild  stenosis. There was mild regurgitation. Peak velocity (S): 277  cm/s. Mean gradient (S): 16 mm Hg. Valve area (VTI): 2.01 cm^2.  Valve area (Vmax): 1.8 cm^2. Valve area (Vmean): 1.73 cm^2.  - Mitral valve: There was mild regurgitation. Valve area by  pressure half-time: 0.93 cm^2.  - Pulmonary arteries: Systolic pressure was mildly increased. PA  peak pressure: 44 mm Hg (S).    Cardiac cath 05/18/17  Dist RCA lesion is 40% stenosed.  Prox RCA lesion is 25% stenosed.  1st Diag lesion is 75% stenosed.  A drug-eluting stent was successfully placed using a STENT SYNERGY DES 2.25X12.  Post intervention, there is a 0% residual stenosis.  Prox LAD to Mid LAD lesion is 75% stenosed.  A drug-eluting stent was successfully placed using a STENT SYNERGY DES 3.5X38.  Post intervention, there is a 0% residual stenosis.  Prox LAD lesion is 70% stenosed.  A drug-eluting stent was successfully placed using a STENT SYNERGY DES 4X12, overlapping the prior stent.  Post  intervention, there is a 0% residual stenosis.  The left ventricular systolic function is normal.  The left ventricular ejection fraction is 55-65% by visual estimate.  LV end diastolic pressure is normal.  There is no aortic valve stenosis.  Ao sat 98%, PA sat 65%; PA mean 25 mm Hg; unable to wedge the catheter.  Normal right heat pressures.  Tortuous right subclavian making catheter navigation difficult from the right radial approach. - Continue dual antiplatelet therapy along with Eliquis for 30 days. - Restart Eliquis tomorrow. - After 30 days, stop aspirin.  Continue Plavix and Eliquis.   - After 6 months, can consider stopping clopidogrel if he has bleeding issues.  Given the nomber of stents,  would try to complete one year of clopidogrel if no bleeding issues.    Event Monitor 06/16/16-06/22/16: Study Highlights  Rhythm is atrial fibrillation throughout study. Occasioanal PVCs  Min HR 51, Max HR 107, Avg HR 67  Reported symptoms correspond with rate controlled atrial fibrillation.    Past Medical History:  Diagnosis Date  . Anemia    a. mild, noted 04/2017.  [name] CAD in native artery    a. [name] 04/2017 s/p DES to D1, DES to prox-mid LAD, DES to prox LAD overlapping the prior stent, LVEF 55-65%.   . Chronic diastolic CHF (congestive heart failure) ([place])   . Constipation   . COPD (chronic obstructive pulmonary disease) ([place])   . Diabetic ulcer of toe ([place])   . DJD (degenerative joint disease) of cervical spine   . Essential hypertension   . GERD (gastroesophageal reflux disease)   . History of hiatal hernia   . HIT (heparin-induced thrombocytopenia) ([place])   . Hypothyroidism   . Hypoxia    a. went home on home O2 04/2017.  [name] Insomnia   . Mixed hyperlipidemia   . PAD (peripheral artery disease) ([place])   . PAF (paroxysmal atrial fibrillation) ([place])   . PVD (peripheral vascular disease) ([place])   . Renal insufficiency   . 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  . Sinus bradycardia    a. HR 30s-40s in 04/2017 -> diltiazem stopped, metoprolol reduced.  . Sleep apnea    "chose not to order CPAP at this time" (05/18/2017)  . Type 2 diabetes mellitus ([place])   . Wheezing    a. suspected COPD 04/2017. Former tobacco x 40 years.    Past Surgical History:  Procedure Laterality Date  . ABDOMINAL AORTOGRAM W/LOWER EXTREMITY N/A 09/15/2017   Procedure: ABDOMINAL AORTOGRAM W/LOWER EXTREMITY;  Surgeon: Serafina Mitchell, MD;  Location: [place] [facility];  Service: Cardiovascular;  Laterality: N/A;  . AMPUTATION Bilateral 09/20/2017   Procedure: BILATERAL GREAT TOE AMPUTATIONS INCLUDING METATARSAL HEADS;  Surgeon: Serafina Mitchell, MD;  Location: [facility];  Service: Vascular;  Laterality:  Bilateral;  . AMPUTATION Right 03/01/2019   Procedure: AMPUTATION TOES;  Surgeon: Angelia Mould, MD;  Location: [place];  Service: Vascular;  Laterality: Right;  . ANGIOPLASTY Right 09/20/2017   Procedure: ANGIOPLASTY RIGHT TIBIAL ARTERY;  Surgeon: Serafina Mitchell, MD;  Location: [facility];  Service: Vascular;  Laterality: Right;  . APPENDECTOMY    . BACK SURGERY    . CARDIAC CATHETERIZATION  1980s; 2012;  . CATARACT EXTRACTION Left 2004  . COLONOSCOPY  2004   FLEISHMAN TICS  . COLONOSCOPY WITH PROPOFOL N/A 11/28/2018   Procedure: COLONOSCOPY WITH PROPOFOL;  Surgeon: Rogene Houston, MD;  [facility];  Service: Endoscopy;  Laterality: N/A;  . CORONARY ANGIOPLASTY WITH STENT PLACEMENT  05/18/2017   "3 stents"  . CORONARY STENT INTERVENTION N/A 05/18/2017   Procedure: CORONARY STENT INTERVENTION;  Surgeon: Jettie Booze, MD;  Location: [place] [facility];  Service: Cardiovascular;  Laterality: N/A;  . ESOPHAGOGASTRODUODENOSCOPY (EGD) WITH PROPOFOL N/A 11/20/2017   Procedure: ESOPHAGOGASTRODUODENOSCOPY (EGD) WITH PROPOFOL;  Surgeon: Irene Shipper, MD;  Location: [place];  Service: Gastroenterology;  Laterality: N/A;  . ESOPHAGOGASTRODUODENOSCOPY (EGD) WITH PROPOFOL N/A 11/28/2018   Procedure: ESOPHAGOGASTRODUODENOSCOPY (EGD) WITH PROPOFOL;  Surgeon: Rogene Houston, MD;  [facility];  Service: Endoscopy;  Laterality: N/A;  . GIVENS CAPSULE STUDY N/A 12/17/2018   Procedure: GIVENS CAPSULE STUDY;  Surgeon: Rogene Houston, MD;  [facility];  Service: Endoscopy;  Laterality: N/A;  7:30am  . I & D EXTREMITY Right 12/17/2017   Procedure: IRRIGATION AND DEBRIDEMENT RIGHT GREAT TOE;  Surgeon: Serafina Mitchell, MD;  [facility];  Service: Vascular;  Laterality: Right;  . JOINT REPLACEMENT    . LEFT HEART CATH AND CORONARY ANGIOGRAPHY N/A 05/18/2017   Procedure: LEFT HEART CATH AND CORONARY ANGIOGRAPHY;  Surgeon: Jettie Booze, MD;  Location: [place] [facility];  Service: Cardiovascular;  Laterality: N/A;  . LOWER EXTREMITY ANGIOGRAM Right 09/20/2017   Procedure: RIGHT LOWER LEG  ANGIOGRAM;  Surgeon: Serafina Mitchell, MD;  Location: [place];  Service: Vascular;  Laterality: Right;  . LUMBAR FUSION  2002   L3, 4 L4, 5 L5 S1 Fused by Dr. Glenna Fellows  . PERIPHERAL VASCULAR BALLOON ANGIOPLASTY Left 09/15/2017   Procedure: PERIPHERAL VASCULAR BALLOON ANGIOPLASTY;  Surgeon: Serafina Mitchell, MD;  Location: [facility] [place] [facility] [place] [facility];  Service: Cardiovascular;  Laterality: Left;  PTA of Peroneal & Posterior Tibial  . POLYPECTOMY  11/28/2018   Procedure: POLYPECTOMY;  Surgeon: Rogene Houston, MD;  [facility];  Service: Endoscopy;;  duodenum  . POSTERIOR LUMBAR FUSION    . RHINOPLASTY    . RIGHT HEART CATH N/A 05/18/2017   Procedure: RIGHT HEART CATH;  Surgeon: Jettie Booze, MD;  Location: [place] [facility];  Service: Cardiovascular;  Laterality: N/A;  . TEE WITHOUT CARDIOVERSION N/A 09/18/2017   Procedure: TRANSESOPHAGEAL ECHOCARDIOGRAM (TEE);  Surgeon: Fay Records, MD;  Location: [place];  Service: Cardiovascular;  Laterality: N/A;  . TOTAL KNEE ARTHROPLASTY Bilateral   . TRANSURETHRAL RESECTION OF PROSTATE  2001   [place]    MEDICATIONS: No current facility-administered medications for this encounter.   [name] acetaminophen (TYLENOL) 500 MG tablet  . atorvastatin (LIPITOR) 80 MG tablet  . cephALEXin (KEFLEX) 500 MG capsule  . Cholecalciferol (VITAMIN D) 50 MCG (2000 UT) tablet  . clopidogrel (PLAVIX) 75 MG tablet  . Cyanocobalamin (B-12) 2500 MCG TABS  . folic acid (FOLVITE) 1 MG tablet  . furosemide (LASIX) 80 MG tablet  . insulin glargine (LANTUS) 100 UNIT/ML injection  . isosorbide mononitrate (IMDUR) 30 MG 24 hr tablet  . lisinopril (ZESTRIL) 5 MG tablet  . meclizine (ANTIVERT) 25 MG tablet  . metFORMIN (GLUCOPHAGE) 1000 MG tablet  . nitroGLYCERIN (NITROSTAT) 0.4 MG SL tablet  . Pediatric Multivitamins-Iron  (FLINTSTONES COMPLETE) 18 MG CHEW  . potassium chloride SA (K-DUR) 20 MEQ tablet  . psyllium (METAMUCIL) 58.6 % packet  . traZODone (DESYREL) 100 MG tablet  . ELIQUIS 5 MG TABS tablet  . furosemide (LASIX) 40 MG tablet  . ondansetron (ZOFRAN) 4 MG tablet  . pantoprazole (PROTONIX)  40 MG tablet    Myra Gianotti, PA-C Surgical Short Stay/Anesthesiology [facility] Phone [phone] [facility] Phone [phone] 05/28/2019 12:34 PM

## 2019-05-28 NOTE — Anesthesia Preprocedure Evaluation (Addendum)
Anesthesia Evaluation  Patient identified by MRN, date of birth, ID band Patient awake    Reviewed: Allergy & Precautions, NPO status , Patient's Chart, lab work & pertinent test results  Airway Mallampati: II  TM Distance: >3 FB     Dental   Pulmonary shortness of breath, sleep apnea , COPD, former smoker,    breath sounds clear to auscultation       Cardiovascular hypertension, + CAD, + Peripheral Vascular Disease and +CHF   Rhythm:Regular Rate:Normal     Neuro/Psych    GI/Hepatic hiatal hernia, GERD  ,  Endo/Other  diabetesHypothyroidism   Renal/GU Renal disease     Musculoskeletal  (+) Arthritis ,   Abdominal   Peds  Hematology  (+) Blood dyscrasia, anemia ,   Anesthesia Other Findings   Reproductive/Obstetrics                           Anesthesia Physical Anesthesia Plan  ASA: III  Anesthesia Plan: MAC   Post-op Pain Management:    Induction: Intravenous  PONV Risk Score and Plan: 2 and Ondansetron, Dexamethasone and Midazolam  Airway Management Planned: LMA  Additional Equipment:   Intra-op Plan:   Post-operative Plan:   Informed Consent: I have reviewed the patients History and Physical, chart, labs and discussed the procedure including the risks, benefits and alternatives for the proposed anesthesia with the patient or authorized representative who has indicated his/her understanding and acceptance.     Dental advisory given  Plan Discussed with: Anesthesiologist  Anesthesia Plan Comments: (See PAT note written 05/28/2019 by Myra Gianotti, PA-C which outlines cardiology input from Dr. [name] regarding chest pain history. SAME DAY WORK-UP   )     Anesthesia Quick Evaluation

## 2019-05-29 ENCOUNTER — Other Ambulatory Visit: Payer: Self-pay

## 2019-05-29 ENCOUNTER — Ambulatory Visit (HOSPITAL_COMMUNITY): Payer: Medicare HMO | Admitting: Vascular Surgery

## 2019-05-29 ENCOUNTER — Encounter (HOSPITAL_COMMUNITY): Payer: Self-pay | Admitting: Surgery

## 2019-05-29 ENCOUNTER — Ambulatory Visit (HOSPITAL_COMMUNITY)
Admission: RE | Admit: 2019-05-29 | Discharge: 2019-05-29 | Disposition: A | Discharge status: Home / Self Care | Payer: Medicare HMO | Attending: Surgery | Admitting: Surgery

## 2019-05-29 ENCOUNTER — Encounter (HOSPITAL_COMMUNITY)
Admission: RE | Disposition: A | Discharge status: Home / Self Care | Payer: Self-pay | Source: Home / Self Care | Attending: Surgery

## 2019-05-29 DIAGNOSIS — I5032 Chronic diastolic (congestive) heart failure: Secondary | ICD-10-CM | POA: Insufficient documentation

## 2019-05-29 DIAGNOSIS — J449 Chronic obstructive pulmonary disease, unspecified: Secondary | ICD-10-CM | POA: Diagnosis not present

## 2019-05-29 DIAGNOSIS — M1990 Unspecified osteoarthritis, unspecified site: Secondary | ICD-10-CM | POA: Diagnosis not present

## 2019-05-29 DIAGNOSIS — K219 Gastro-esophageal reflux disease without esophagitis: Secondary | ICD-10-CM | POA: Diagnosis not present

## 2019-05-29 DIAGNOSIS — Z794 Long term (current) use of insulin: Secondary | ICD-10-CM | POA: Diagnosis not present

## 2019-05-29 DIAGNOSIS — I779 Disorder of arteries and arterioles, unspecified: Secondary | ICD-10-CM | POA: Diagnosis not present

## 2019-05-29 DIAGNOSIS — Z7902 Long term (current) use of antithrombotics/antiplatelets: Secondary | ICD-10-CM | POA: Insufficient documentation

## 2019-05-29 DIAGNOSIS — Z955 Presence of coronary angioplasty implant and graft: Secondary | ICD-10-CM | POA: Insufficient documentation

## 2019-05-29 DIAGNOSIS — E11621 Type 2 diabetes mellitus with foot ulcer: Secondary | ICD-10-CM | POA: Diagnosis not present

## 2019-05-29 DIAGNOSIS — I110 Hypertensive heart disease with heart failure: Secondary | ICD-10-CM | POA: Insufficient documentation

## 2019-05-29 DIAGNOSIS — I5033 Acute on chronic diastolic (congestive) heart failure: Secondary | ICD-10-CM | POA: Diagnosis not present

## 2019-05-29 DIAGNOSIS — G4730 Sleep apnea, unspecified: Secondary | ICD-10-CM | POA: Insufficient documentation

## 2019-05-29 DIAGNOSIS — E1151 Type 2 diabetes mellitus with diabetic peripheral angiopathy without gangrene: Secondary | ICD-10-CM | POA: Diagnosis not present

## 2019-05-29 DIAGNOSIS — I2510 Atherosclerotic heart disease of native coronary artery without angina pectoris: Secondary | ICD-10-CM | POA: Diagnosis not present

## 2019-05-29 DIAGNOSIS — Z79899 Other long term (current) drug therapy: Secondary | ICD-10-CM | POA: Diagnosis not present

## 2019-05-29 DIAGNOSIS — I493 Ventricular premature depolarization: Secondary | ICD-10-CM | POA: Insufficient documentation

## 2019-05-29 DIAGNOSIS — L97519 Non-pressure chronic ulcer of other part of right foot with unspecified severity: Secondary | ICD-10-CM | POA: Insufficient documentation

## 2019-05-29 DIAGNOSIS — Z87891 Personal history of nicotine dependence: Secondary | ICD-10-CM | POA: Diagnosis not present

## 2019-05-29 DIAGNOSIS — I70261 Atherosclerosis of native arteries of extremities with gangrene, right leg: Secondary | ICD-10-CM | POA: Diagnosis not present

## 2019-05-29 DIAGNOSIS — E785 Hyperlipidemia, unspecified: Secondary | ICD-10-CM | POA: Insufficient documentation

## 2019-05-29 DIAGNOSIS — Z7901 Long term (current) use of anticoagulants: Secondary | ICD-10-CM | POA: Diagnosis not present

## 2019-05-29 DIAGNOSIS — I480 Paroxysmal atrial fibrillation: Secondary | ICD-10-CM | POA: Insufficient documentation

## 2019-05-29 HISTORY — DX: Chronic obstructive pulmonary disease, unspecified: J44.9

## 2019-05-29 HISTORY — PX: AMPUTATION: SHX166

## 2019-05-29 HISTORY — DX: Constipation, unspecified: K59.00

## 2019-05-29 LAB — BASIC METABOLIC PANEL
Anion gap: 13 (ref 5–15)
BUN: 40 mg/dL — ABNORMAL HIGH (ref 8–23)
CO2: 24 mmol/L (ref 22–32)
Calcium: 9 mg/dL (ref 8.9–10.3)
Chloride: 100 mmol/L (ref 98–111)
Creatinine, Ser: 1.38 mg/dL — ABNORMAL HIGH (ref 0.61–1.24)
GFR calc Af Amer: 54 mL/min — ABNORMAL LOW (ref >60)
GFR calc non Af Amer: 47 mL/min — ABNORMAL LOW (ref >60)
Glucose, Bld: 124 mg/dL — ABNORMAL HIGH (ref 70–99)
Potassium: 4.4 mmol/L (ref 3.5–5.1)
Sodium: 137 mmol/L (ref 135–145)

## 2019-05-29 LAB — CBC
HCT: 27.1 % — ABNORMAL LOW (ref 39.0–52.0)
Hemoglobin: 8.1 g/dL — ABNORMAL LOW (ref 13.0–17.0)
MCH: 23.7 pg — ABNORMAL LOW (ref 26.0–34.0)
MCHC: 29.9 g/dL — ABNORMAL LOW (ref 30.0–36.0)
MCV: 79.2 fL — ABNORMAL LOW (ref 80.0–100.0)
Platelets: 195 10*3/uL (ref 150–400)
RBC: 3.42 MIL/uL — ABNORMAL LOW (ref 4.22–5.81)
RDW: 18.4 % — ABNORMAL HIGH (ref 11.5–15.5)
WBC: 7.3 10*3/uL (ref 4.0–10.5)
nRBC: 0 % (ref 0.0–0.2)

## 2019-05-29 LAB — PROTIME-INR
INR: 1.2 (ref 0.8–1.2)
Prothrombin Time: 14.8 seconds (ref 11.4–15.2)

## 2019-05-29 LAB — GLUCOSE, CAPILLARY
Glucose-Capillary: 139 mg/dL — ABNORMAL HIGH (ref 70–99)
Glucose-Capillary: 116 mg/dL — ABNORMAL HIGH (ref 70–99)

## 2019-05-29 LAB — SURGICAL PCR SCREEN
MRSA, PCR: NEGATIVE
Staphylococcus aureus: NEGATIVE

## 2019-05-29 SURGERY — AMPUTATION DIGIT
Anesthesia: General | Laterality: Right

## 2019-05-29 MED ORDER — PROPOFOL 10 MG/ML IV BOLUS
INTRAVENOUS | Status: DC | PRN
  Administered 2019-05-29: 170 mg via INTRAVENOUS

## 2019-05-29 MED ORDER — DEXAMETHASONE SODIUM PHOSPHATE 4 MG/ML IJ SOLN
INTRAMUSCULAR | Status: DC | PRN
  Administered 2019-05-29: 5 mg via INTRAVENOUS

## 2019-05-29 MED ORDER — PHENYLEPHRINE 40 MCG/ML (10ML) SYRINGE FOR IV PUSH (FOR BLOOD PRESSURE SUPPORT)
PREFILLED_SYRINGE | INTRAVENOUS | Status: DC | PRN
  Administered 2019-05-29: 40 ug via INTRAVENOUS
  Administered 2019-05-29: 80 ug via INTRAVENOUS
  Administered 2019-05-29: 120 ug via INTRAVENOUS
  Administered 2019-05-29 (×2): 80 ug via INTRAVENOUS

## 2019-05-29 MED ORDER — ONDANSETRON HCL 4 MG/2ML IJ SOLN
INTRAMUSCULAR | Status: DC | PRN
  Administered 2019-05-29: 4 mg via INTRAVENOUS

## 2019-05-29 MED ORDER — FENTANYL CITRATE (PF) 100 MCG/2ML IJ SOLN
INTRAMUSCULAR | Status: DC | PRN
  Administered 2019-05-29: 25 ug via INTRAVENOUS

## 2019-05-29 MED ORDER — LACTATED RINGERS IV SOLN: INTRAVENOUS | Status: DC

## 2019-05-29 MED ORDER — 0.9 % SODIUM CHLORIDE (POUR BTL) OPTIME
TOPICAL | Status: DC | PRN
  Administered 2019-05-29: 10:00:00 1000 mL

## 2019-05-29 MED ORDER — CHLORHEXIDINE GLUCONATE CLOTH 2 % EX PADS: 6.0000 | MEDICATED_PAD | Freq: Once | CUTANEOUS | Status: DC

## 2019-05-29 MED ORDER — MUPIROCIN 2 % EX OINT
1.0000 "application " | TOPICAL_OINTMENT | Freq: Once | CUTANEOUS | Status: AC
  Administered 2019-05-29: 1 via TOPICAL
  Filled 2019-05-29: qty 22

## 2019-05-29 MED ORDER — SODIUM CHLORIDE 0.9 % IV SOLN: INTRAVENOUS | Status: DC

## 2019-05-29 MED ORDER — FENTANYL CITRATE (PF) 100 MCG/2ML IJ SOLN: 25.0000 ug | INTRAMUSCULAR | Status: DC | PRN

## 2019-05-29 MED ORDER — FENTANYL CITRATE (PF) 250 MCG/5ML IJ SOLN
INTRAMUSCULAR | Status: AC
  Filled 2019-05-29: qty 5

## 2019-05-29 MED ORDER — TRAMADOL HCL 50 MG PO TABS: 50.0000 mg | ORAL_TABLET | Freq: Four times a day (QID) | ORAL | 0 refills | Status: DC | PRN

## 2019-05-29 MED ORDER — CEFAZOLIN SODIUM-DEXTROSE 2-4 GM/100ML-% IV SOLN
2.0000 g | INTRAVENOUS | Status: AC
  Administered 2019-05-29: 10:00:00 2 g via INTRAVENOUS
  Filled 2019-05-29: qty 100

## 2019-05-29 MED ORDER — LIDOCAINE-EPINEPHRINE 1 %-1:100000 IJ SOLN
INTRAMUSCULAR | Status: AC
  Filled 2019-05-29: qty 1

## 2019-05-29 MED ORDER — LIDOCAINE 2% (20 MG/ML) 5 ML SYRINGE
INTRAMUSCULAR | Status: DC | PRN
  Administered 2019-05-29: 100 mg via INTRAVENOUS

## 2019-05-29 MED ORDER — LIDOCAINE-EPINEPHRINE 1 %-1:100000 IJ SOLN
INTRAMUSCULAR | Status: DC | PRN
  Administered 2019-05-29: 10 mL

## 2019-05-29 SURGICAL SUPPLY — 34 items
BLADE AVERAGE 25MMX9MM (BLADE)
BLADE AVERAGE 25X9 (BLADE) IMPLANT
BLADE SAW SGTL 81X20 HD (BLADE) IMPLANT
BNDG ELASTIC 4X5.8 VLCR STR LF (GAUZE/BANDAGES/DRESSINGS) ×3 IMPLANT
BNDG GAUZE ELAST 4 BULKY (GAUZE/BANDAGES/DRESSINGS) ×3 IMPLANT
CANISTER SUCT 3000ML PPV (MISCELLANEOUS) ×3 IMPLANT
COVER SURGICAL LIGHT HANDLE (MISCELLANEOUS) ×3 IMPLANT
COVER WAND RF STERILE (DRAPES) ×3 IMPLANT
DRAPE EXTREMITY T 121X128X90 (DISPOSABLE) ×3 IMPLANT
DRAPE HALF SHEET 40X57 (DRAPES) ×3 IMPLANT
DRSG EMULSION OIL 3X3 NADH (GAUZE/BANDAGES/DRESSINGS) ×3 IMPLANT
ELECT REM PT RETURN 9FT ADLT (ELECTROSURGICAL) ×3
ELECTRODE REM PT RTRN 9FT ADLT (ELECTROSURGICAL) ×1 IMPLANT
GAUZE SPONGE 4X4 12PLY STRL (GAUZE/BANDAGES/DRESSINGS) IMPLANT
GAUZE SPONGE 4X4 12PLY STRL LF (GAUZE/BANDAGES/DRESSINGS) ×3 IMPLANT
GLOVE BIOGEL PI IND STRL 7.5 (GLOVE) ×1 IMPLANT
GLOVE BIOGEL PI INDICATOR 7.5 (GLOVE) ×2
GLOVE SURG SS PI 7.5 STRL IVOR (GLOVE) ×3 IMPLANT
GOWN STRL REUS W/ TWL LRG LVL3 (GOWN DISPOSABLE) ×2 IMPLANT
GOWN STRL REUS W/ TWL XL LVL3 (GOWN DISPOSABLE) ×1 IMPLANT
GOWN STRL REUS W/TWL LRG LVL3 (GOWN DISPOSABLE) ×6
GOWN STRL REUS W/TWL XL LVL3 (GOWN DISPOSABLE) ×3
KIT BASIN OR (CUSTOM PROCEDURE TRAY) ×3 IMPLANT
KIT TURNOVER KIT B (KITS) ×3 IMPLANT
NEEDLE HYPO 25GX1X1/2 BEV (NEEDLE) ×3 IMPLANT
NS IRRIG 1000ML POUR BTL (IV SOLUTION) ×3 IMPLANT
PACK GENERAL/GYN (CUSTOM PROCEDURE TRAY) ×3 IMPLANT
PAD ARMBOARD 7.5X6 YLW CONV (MISCELLANEOUS) ×6 IMPLANT
SUT ETHILON 2 0 PSLX (SUTURE) ×3 IMPLANT
SUT ETHILON 3 0 PS 1 (SUTURE) IMPLANT
SYR CONTROL 10ML LL (SYRINGE) ×3 IMPLANT
TOWEL GREEN STERILE (TOWEL DISPOSABLE) ×6 IMPLANT
UNDERPAD 30X30 (UNDERPADS AND DIAPERS) ×3 IMPLANT
WATER STERILE IRR 1000ML POUR (IV SOLUTION) ×3 IMPLANT

## 2019-05-29 NOTE — Transfer of Care (Signed)
Immediate Anesthesia Transfer of Care Note  Patient: GAJE SECKINGER  Procedure(s) Performed: AMPUTATION RIGHT THIRD TOE (Right )  Patient Location: PACU  Anesthesia Type:General  Level of Consciousness: awake, alert  and oriented  Airway & Oxygen Therapy: Patient Spontanous Breathing and Patient connected to face mask oxygen  Post-op Assessment: Report given to RN and Post -op Vital signs reviewed and stable  Post vital signs: Reviewed and stable  Last Vitals:  Vitals Value Taken Time  BP 117/71 05/29/19 1040  Temp    Pulse 64 05/29/19 1042  Resp 21 05/29/19 1042  SpO2 100 % 05/29/19 1042  Vitals shown include unvalidated device data.  Last Pain:  Vitals:   05/29/19 0827  TempSrc:   PainSc: 0-No pain      Patients Stated Pain Goal: 4 ([id number])  Complications: No apparent anesthesia complications

## 2019-05-29 NOTE — Interval H&P Note (Signed)
History and Physical Interval Note:  05/29/2019 9:34 AM  Mike Wright  has presented today for surgery, with the diagnosis of PERIPHERAL ARTERY DISEASE.  The various methods of treatment have been discussed with the patient and family. After consideration of risks, benefits and other options for treatment, the patient has consented to  Procedure(s): AMPUTATION RIGHT THIRD TOE (Right) as a surgical intervention.  The patient's history has been reviewed, patient examined, no change in status, stable for surgery.  I have reviewed the patient's chart and labs.  Questions were answered to the patient's satisfaction.     [name]

## 2019-05-29 NOTE — Anesthesia Postprocedure Evaluation (Signed)
Anesthesia Post Note  Patient: Mike Wright  Procedure(s) Performed: AMPUTATION RIGHT THIRD TOE (Right )     Anesthesia Post Evaluation  Last Vitals:  Vitals:   05/29/19 1055 05/29/19 1100  BP: (!) 131/96   Pulse: 68 (!) 56  Resp: 20 17  Temp: 36.5 C   SpO2: 99% 97%    Last Pain:  Vitals:   05/29/19 1100  TempSrc:   PainSc: 0-No pain                 Darvin Dials

## 2019-05-29 NOTE — Op Note (Signed)
    Patient name: Mike Wright MRN: [id number] DOB: [birth year] Sex: male  05/29/2019 Pre-operative Diagnosis: Right third toe infection Post-operative diagnosis:  Same Surgeon:  [name] Assistants: None Procedure:   Right third toe ray amputation Anesthesia: General Blood Loss: Minimal Specimens: Right third toe to pathology  Findings: Moderate capillary bleeding at the amputation site  Indications: The patient has previously undergone revascularization as well as amputation of his first and second right toe.  He presents today with new onset infection and ulceration to his right third toe.  We discussed proceeding with right third toe amputation  Procedure:  The patient was identified in the holding area and taken to [place] [facility]  The patient was then placed supine on the table. topical anesthesia was administered.  The patient was prepped and draped in the usual sterile fashion.  A time out was called and antibiotics were administered.  A fishmouth type incision was made at the base of the right third toe.  A #10 blade was used to carry the incision down to the bone which was transected with large bone cutters.  A rongeur was used to remove more proximal bone which was healthy.  There was adequate capillary bleeding.  The wound was then copiously irrigated.  1% lidocaine was injected into the wound.  It was then closed with interrupted 3-0 nylon mattress sutures.  Sterile dressings were applied.  There were no immediate complications.   Disposition: To PACU stable.   Theotis Burrow, M.D., [facility] [place] Office: [phone] Pager:  [phone]

## 2019-05-29 NOTE — Anesthesia Procedure Notes (Signed)
Procedure Name: LMA Insertion Date/Time: 05/29/2019 10:09 AM Performed by: Candis Shine, CRNA Pre-anesthesia Checklist: Patient identified, Emergency Drugs available, Suction available and Patient being monitored Patient Re-evaluated:Patient Re-evaluated prior to induction Oxygen Delivery Method: Circle System Utilized Preoxygenation: Pre-oxygenation with 100% oxygen Induction Type: IV induction LMA: LMA inserted LMA Size: 4.0 Number of attempts: 1 Placement Confirmation: positive ETCO2 Tube secured with: Tape Dental Injury: Teeth and Oropharynx as per pre-operative assessment

## 2019-05-30 LAB — GLUCOSE, CAPILLARY: Glucose-Capillary: 122 mg/dL — ABNORMAL HIGH (ref 70–99)

## 2019-06-05 DIAGNOSIS — I96 Gangrene, not elsewhere classified: Secondary | ICD-10-CM | POA: Diagnosis not present

## 2019-06-05 DIAGNOSIS — I5032 Chronic diastolic (congestive) heart failure: Secondary | ICD-10-CM | POA: Diagnosis not present

## 2019-06-05 DIAGNOSIS — Z89411 Acquired absence of right great toe: Secondary | ICD-10-CM | POA: Diagnosis not present

## 2019-06-05 DIAGNOSIS — Z89412 Acquired absence of left great toe: Secondary | ICD-10-CM | POA: Diagnosis not present

## 2019-06-10 DIAGNOSIS — I10 Essential (primary) hypertension: Secondary | ICD-10-CM | POA: Diagnosis not present

## 2019-06-10 DIAGNOSIS — Z299 Encounter for prophylactic measures, unspecified: Secondary | ICD-10-CM | POA: Diagnosis not present

## 2019-06-10 DIAGNOSIS — I739 Peripheral vascular disease, unspecified: Secondary | ICD-10-CM | POA: Diagnosis not present

## 2019-06-10 DIAGNOSIS — E1165 Type 2 diabetes mellitus with hyperglycemia: Secondary | ICD-10-CM | POA: Diagnosis not present

## 2019-06-10 DIAGNOSIS — I509 Heart failure, unspecified: Secondary | ICD-10-CM | POA: Diagnosis not present

## 2019-06-10 DIAGNOSIS — E1151 Type 2 diabetes mellitus with diabetic peripheral angiopathy without gangrene: Secondary | ICD-10-CM | POA: Diagnosis not present

## 2019-06-10 DIAGNOSIS — Z89421 Acquired absence of other right toe(s): Secondary | ICD-10-CM | POA: Diagnosis not present

## 2019-06-10 DIAGNOSIS — I5032 Chronic diastolic (congestive) heart failure: Secondary | ICD-10-CM | POA: Diagnosis not present

## 2019-06-12 ENCOUNTER — Other Ambulatory Visit: Payer: Self-pay

## 2019-06-12 NOTE — Patient Outreach (Signed)
[facility]) Care Management  [place]  06/12/2019   SID PERCH 1933/10/14 [id number]  Subjective: Telephone call to patient for bi monthly check in. Patient reports that he is not feeling the best today. He states he had a low sugar of 78 and drank some orange juice but got sick on the stomach after.  He states he feels some better and is just taking it easy right now.  He has not rechecked his sugar as he has not eaten anything but will be checking it. Advised patient to drink something like ginger ale just to settle his stomach and advised to at least drink if he does not eat. He verbalized understanding. Patient reports that weights are ranging from 186-188 lbs. Reiterated weight importance and notifying physician for any changes.  He verbalized understanding.  Patient had recent right third toe amputation. He states that the area is fine no redness of signs of infection.  He has follow up with surgeon on 07-01-19.  No problems with transportation.  Patient voices no concerns.    Objective:   Encounter Medications:  Outpatient Encounter Medications as of 06/12/2019  Medication Sig Note  . acetaminophen (TYLENOL) 500 MG tablet Take 1 tablet (500 mg total) by mouth every 6 (six) hours as needed for mild pain or headache.   [name] atorvastatin (LIPITOR) 80 MG tablet Take 80 mg by mouth every evening.    . cephALEXin (KEFLEX) 500 MG capsule Take 1 capsule (500 mg total) by mouth 3 (three) times daily. 05/28/2019: Started 3/1 should finish around 3/8  . Cholecalciferol (VITAMIN D) 50 MCG (2000 UT) tablet Take 2,000 Units by mouth daily.   . clopidogrel (PLAVIX) 75 MG tablet Take 75 mg by mouth daily.   . Cyanocobalamin (B-12) 2500 MCG TABS Take 2,500 mcg by mouth daily.   [name] ELIQUIS 5 MG TABS tablet TAKE 1 TABLET TWICE DAILY (Patient taking differently: Take 5 mg by mouth 2 (two) times daily. ) 05/28/2019: On hold for procedure  . folic acid (FOLVITE) 1 MG tablet Take 1 tablet  (1 mg total) by mouth daily.   . furosemide (LASIX) 80 MG tablet Take 80 mg by mouth 2 (two) times daily.   . insulin glargine (LANTUS) 100 UNIT/ML injection Inject 30-40 Units into the skin See admin instructions. Inject 30 units SQ in the morning and inject 40 units SQ at bedtime 05/29/2019: 12 units this morning  . isosorbide mononitrate (IMDUR) 30 MG 24 hr tablet TAKE 1/2 TABLET EVERY DAY (Patient taking differently: Take 15 mg by mouth daily. )   . lisinopril (ZESTRIL) 5 MG tablet Take 0.5 tablets (2.5 mg total) by mouth daily. (Patient taking differently: Take 5 mg by mouth daily. )   . meclizine (ANTIVERT) 25 MG tablet Take 25 mg by mouth daily as needed for dizziness.   . metFORMIN (GLUCOPHAGE) 1000 MG tablet Take 1,000 mg by mouth 2 (two) times daily.   . nitroGLYCERIN (NITROSTAT) 0.4 MG SL tablet PLACE 1 TABLET (0.4 MG TOTAL) UNDER THE TONGUE EVERY 5  MINUTES AS NEEDED FOR CHEST PAIN. (Patient taking differently: Place 0.4 mg under the tongue every 5 (five) minutes as needed for chest pain. )   . ondansetron (ZOFRAN) 4 MG tablet Take 1 tablet (4 mg total) by mouth every 6 (six) hours as needed for nausea. (Patient not taking: Reported on 05/28/2019)   . pantoprazole (PROTONIX) 40 MG tablet Take 1 tablet (40 mg total) by mouth 2 (two) times  daily. To protect stomach while taking multiple blood thinners. (Patient not taking: Reported on 05/28/2019)   . Pediatric Multivitamins-Iron (FLINTSTONES COMPLETE) 18 MG CHEW Chew 1 tablet by mouth 2 (two) times daily.   . potassium chloride SA (K-DUR) 20 MEQ tablet Take 1 tablet (20 mEq total) by mouth daily.   . psyllium (METAMUCIL) 58.6 % packet Take 1 packet by mouth at bedtime.    . traMADol (ULTRAM) 50 MG tablet Take 1 tablet (50 mg total) by mouth every 6 (six) hours as needed.   . traZODone (DESYREL) 100 MG tablet Take 100 mg by mouth at bedtime.     No facility-administered encounter medications on file as of 06/12/2019.    Functional Status:  In  your present state of health, do you have any difficulty performing the following activities: 05/29/2019 03/01/2019  Hearing? N N  Vision? N N  Difficulty concentrating or making decisions? N N  Walking or climbing stairs? Y Y  Comment - sometimes SOB  Dressing or bathing? N N  Doing errands, shopping? - N  Comment - -  Conservation officer, nature and eating ? - -  Using the Toilet? - -  In the past six months, have you accidently leaked urine? - -  Comment - -  Do you have problems with loss of bowel control? - -  Managing your Medications? - -  Managing your Finances? - -  Housekeeping or managing your Housekeeping? - -  Comment - -  Some recent data might be hidden    Fall/Depression Screening: Fall Risk  06/12/2019 04/23/2019 12/11/2018  Falls in the past year? 0 0 1  Number falls in past yr: - - 1  Injury with Fall? - - 0  Risk for fall due to : - - History of fall(s);Impaired balance/gait  Follow up - - Falls prevention discussed   PHQ 2/9 Scores 06/12/2019 12/20/2018  PHQ - 2 Score 0 0    Assessment: Patient continues to manage chronic illnesses.  Plan:  [facility] CM Care Plan Problem One     Most Recent Value  Care Plan Problem One  High risk for hospital readmission related to as evidenced by recent hospitalization for Acute on Chronic heart failure, atrial fib, heme positive stools   Role Documenting the Problem One  Care Management [place] for Problem One  Active  [facility] Long Term Goal   Over the next 90 days, patient will demonstrate and/or verbalize understanding of self-health management for long term care of CHF .  [facility] Long Term Goal Start Date  06/12/19  Interventions for Problem One Long Term Goal  RN CM reviewed weights with patient. Discussed importance of weights and notifying physician for any changes.      RN CM will contact patient again in the month of May and patient agreeable.     [name], RN, MSN [place] Management Care Management  Coordinator Direct Line [phone] Cell [phone] Toll Free: [phone]  Fax: [phone]

## 2019-06-26 ENCOUNTER — Telehealth (INDEPENDENT_AMBULATORY_CARE_PROVIDER_SITE_OTHER): Payer: Self-pay | Admitting: *Deleted

## 2019-06-26 ENCOUNTER — Other Ambulatory Visit (INDEPENDENT_AMBULATORY_CARE_PROVIDER_SITE_OTHER): Payer: Self-pay | Admitting: *Deleted

## 2019-06-26 ENCOUNTER — Encounter (INDEPENDENT_AMBULATORY_CARE_PROVIDER_SITE_OTHER): Payer: Self-pay | Admitting: *Deleted

## 2019-06-26 ENCOUNTER — Other Ambulatory Visit: Payer: Self-pay

## 2019-06-26 ENCOUNTER — Ambulatory Visit (INDEPENDENT_AMBULATORY_CARE_PROVIDER_SITE_OTHER): Payer: Self-pay

## 2019-06-26 NOTE — Telephone Encounter (Signed)
Patient is schedule for repeat endoscopy 07/12/19 - we need him to stop Eliquis 2 days prior - please advise if ok to stop - thanks

## 2019-06-26 NOTE — Telephone Encounter (Signed)
OK to hold Eliquis 2 days before endoscopy and resume night of procedure.

## 2019-06-26 NOTE — Telephone Encounter (Signed)
Referring MD/PCP: shah   Procedure: egd w propofol  Reason/Indication:  Duodenal polyp  Has patient had this procedure before?  Yes, 11/2018  If so, when, by whom and where?    Is there a family history of colon cancer?    Who?  What age when diagnosed?    Is patient diabetic?   yes      Does patient have prosthetic heart valve or mechanical valve?  no  Do you have a pacemaker/defibrillator?  no  Has patient ever had endocarditis/atrial fibrillation? Yes, atrial fibrillation  Does patient use oxygen? prn  Has patient had joint replacement within last 12 months?  no  Is patient constipated or do they take laxatives? no  Does patient have a history of alcohol/drug use?  no  Is patient on blood thinner such as Coumadin, Plavix and/or Aspirin? yes  Medications: eliquis 5 mg daily, tylenol 500 mg 1 tab every 6 hrs, atorvastastin 20 mg daily, Vit B12 daily, Vit D3 daily, [place] vitamin daily, folic acid 1 mg daily, furosemide 40 mg bid, ibuprofen 600 mg, lantus 40 units in am 30 units at bedtime, isosorbide 30 mg daily, lisinopril 5 mg daily, meclizine 12.5 mg tid, metformin 1000 mg bid, nitro prn, ondansetrom 4 mg every 6 hrs prn, pantoprazole 40 mg bid, potassium 20 meq daily, metamucil 1 packet bid, trazadone 100 mg 1-2 tabs at bedtime  Allergies: heparin, bactrim, codeine, losartan, oxycodone  Medication Adjustment per Dr Laural Golden: eliquis 2 days  Procedure date & time: 07/12/19

## 2019-06-26 NOTE — Telephone Encounter (Signed)
patient aware

## 2019-06-27 ENCOUNTER — Telehealth (HOSPITAL_COMMUNITY): Payer: Self-pay

## 2019-06-27 ENCOUNTER — Other Ambulatory Visit (INDEPENDENT_AMBULATORY_CARE_PROVIDER_SITE_OTHER): Payer: Self-pay | Admitting: *Deleted

## 2019-06-27 DIAGNOSIS — D132 Benign neoplasm of duodenum: Secondary | ICD-10-CM

## 2019-06-27 NOTE — Telephone Encounter (Signed)

## 2019-07-01 ENCOUNTER — Encounter: Payer: Self-pay | Admitting: Surgery

## 2019-07-01 ENCOUNTER — Other Ambulatory Visit: Payer: Self-pay

## 2019-07-01 ENCOUNTER — Ambulatory Visit (INDEPENDENT_AMBULATORY_CARE_PROVIDER_SITE_OTHER): Payer: Self-pay | Admitting: Surgery

## 2019-07-01 VITALS — BP 117/58 | HR 72 | Temp 98.8°F | Resp 16 | Ht 67.0 in | Wt 191.0 lb

## 2019-07-01 DIAGNOSIS — I7025 Atherosclerosis of native arteries of other extremities with ulceration: Secondary | ICD-10-CM

## 2019-07-01 NOTE — Progress Notes (Signed)
Patient name: Mike Wright MRN: [id number] DOB: 1934/02/19 Sex: male  REASON FOR VISIT:     post op  HISTORY OF PRESENT ILLNESS:   Mike Minzey Butleris a 86 y.o.malewho is status post balloon angioplasty of the left peroneal and posterior tibial artery as well as the right posterior tibial artery in June 2019. 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 He has been getting wound care. When he presented to the office in September, he had cellulitis on his foot and was admitted for IV antibiotics.On 12/17/2017, I took him to the operating room for I&D of his right great toe including resection of additional bone.HisCellulitisresolved and his owunds healded  He presented with a gangrenous right second toe and had a second toe amputation by Dr. Scot Wright on 14-06-2019. He later developed a 3rd toe infection and had a toe amputation on 05-29-2019  He has a hx of chest pain and underwent cardiac catheterization in February 2019 with stent placement. He remains on Plavix. He is on Eliquis for PAF. He is on insulin for diabetes. He is on nebs and inhalers for his COPD. The pt is on a statin for cholesterol management.He takes a beta blocker, ACEI and CCB for blood pressure management.  CURRENT MEDICATIONS:    Current Outpatient Medications  Medication Sig Dispense Refill  . acetaminophen (TYLENOL) 500 MG tablet Take 1 tablet (500 mg total) by mouth every 6 (six) hours as needed for mild pain or headache. 30 tablet 0  . atorvastatin (LIPITOR) 80 MG tablet Take 80 mg by mouth every evening.     . cephALEXin (KEFLEX) 500 MG capsule Take 1 capsule (500 mg total) by mouth 3 (three) times daily. 21 capsule 0  . Cholecalciferol (VITAMIN D) 50 MCG (2000 UT) tablet Take 2,000 Units by mouth daily.    . clopidogrel (PLAVIX) 75 MG tablet Take 75 mg by mouth daily.    . Cyanocobalamin (B-12) 2500 MCG TABS Take 2,500 mcg by mouth daily.    [name]  ELIQUIS 5 MG TABS tablet TAKE 1 TABLET TWICE DAILY (Patient taking differently: Take 5 mg by mouth 2 (two) times daily. ) [id number] tablet 3  . folic acid (FOLVITE) 1 MG tablet Take 1 tablet (1 mg total) by mouth daily. 30 tablet 1  . furosemide (LASIX) 80 MG tablet Take 80 mg by mouth 2 (two) times daily.    . insulin glargine (LANTUS) 100 UNIT/ML injection Inject 30-40 Units into the skin See admin instructions. Inject 30 units SQ in the morning and inject 40 units SQ at bedtime    . isosorbide mononitrate (IMDUR) 30 MG 24 hr tablet TAKE 1/2 TABLET EVERY DAY (Patient taking differently: Take 15 mg by mouth daily. ) 45 tablet 3  . lisinopril (ZESTRIL) 5 MG tablet Take 0.5 tablets (2.5 mg total) by mouth daily. (Patient taking differently: Take 5 mg by mouth daily. )    . meclizine (ANTIVERT) 25 MG tablet Take 25 mg by mouth daily as needed for dizziness.    . metFORMIN (GLUCOPHAGE) 1000 MG tablet Take 1,000 mg by mouth 2 (two) times daily.    . nitroGLYCERIN (NITROSTAT) 0.4 MG SL tablet PLACE 1 TABLET (0.4 MG TOTAL) UNDER THE TONGUE EVERY 5  MINUTES AS NEEDED FOR CHEST PAIN. (Patient taking differently: Place 0.4 mg under the tongue every 5 (five) minutes as needed for chest pain. ) 25 tablet 3  . ondansetron (ZOFRAN) 4 MG tablet Take 1 tablet (4 mg total)  by mouth every 6 (six) hours as needed for nausea. 20 tablet 0  . pantoprazole (PROTONIX) 40 MG tablet Take 1 tablet (40 mg total) by mouth 2 (two) times daily. To protect stomach while taking multiple blood thinners. 60 tablet 2  . Pediatric Multivitamins-Iron (FLINTSTONES COMPLETE) 18 MG CHEW Chew 1 tablet by mouth 2 (two) times daily.    . potassium chloride SA (K-DUR) 20 MEQ tablet Take 1 tablet (20 mEq total) by mouth daily. 30 tablet 1  . psyllium (METAMUCIL) 58.6 % packet Take 1 packet by mouth at bedtime.     . traMADol (ULTRAM) 50 MG tablet Take 1 tablet (50 mg total) by mouth every 6 (six) hours as needed. 20 tablet 0  . traZODone (DESYREL) 100  MG tablet Take 100 mg by mouth at bedtime.      No current facility-administered medications for this visit.    REVIEW OF SYSTEMS:   [X]  denotes positive finding, [ ]  denotes negative finding Cardiac  Comments:  Chest pain or chest pressure:    Shortness of breath upon exertion:    Short of breath when lying flat:    Irregular heart rhythm:    Constitutional    Fever or chills:      PHYSICAL EXAM:   Vitals:   07/01/19 1101  BP: (!) 117/58  Pulse: 72  Resp: 16  Temp: 98.8 F (37.1 C)  TempSrc: Temporal  SpO2: 99%  Weight: 191 lb (86.6 kg)  Height: 5\' 7"  (1.702 m)    GENERAL: The patient is a well-nourished male, in no acute distress. The vital signs are documented above. CARDIOVASCULAR: There is a regular rate and rhythm. PULMONARY: Non-labored respirations Suture was removed from right third toe amputation which has completely healed  STUDIES:   None   MEDICAL ISSUES:   The patient has completely healed his right third toe amputation.  He has no open wounds.  I will bring him back in 6 months for repeat lower extremity duplex to evaluate his previous interventions.  I will check carotid Doppler study since he has not had this done and has extensive atherosclerotic vascular disease.  Leia Alf, MD, FACS [facility] [phone] Pager [phone]

## 2019-07-01 NOTE — Telephone Encounter (Signed)
EGD with propofol 

## 2019-07-02 ENCOUNTER — Other Ambulatory Visit: Payer: Self-pay | Admitting: *Deleted

## 2019-07-02 DIAGNOSIS — I7025 Atherosclerosis of native arteries of other extremities with ulceration: Secondary | ICD-10-CM

## 2019-07-02 DIAGNOSIS — R0989 Other specified symptoms and signs involving the circulatory and respiratory systems: Secondary | ICD-10-CM

## 2019-07-02 DIAGNOSIS — I779 Disorder of arteries and arterioles, unspecified: Secondary | ICD-10-CM

## 2019-07-08 NOTE — Patient Instructions (Signed)
60    Your procedure is scheduled on: 07/12/2019  Report to [facility] at   11:55  AM.  Call this number if you have problems the morning of surgery: [phone]   Remember:   Do not drink or eat food:After Midnight.             No Smoking the day of procedure               Hold Eliquis and plavix as instructed by Dr Olevia Perches office    Take these medicines the morning of surgery with A SIP OF WATER: isosorbide, lisinopril and protonix               Take only 1/2 dose of lantus the night before procedure 20 units.                             No diabetic medication the morning of procedure.      Do not wear jewelry, make-up or nail polish.  Do not wear lotions, powders, or perfumes. You may wear deodorant.                Do not bring valuables to the hospital.  Contacts, dentures or bridgework may not be worn into surgery.  Leave suitcase in the car. After surgery it may be brought to your room.  For patients admitted to the hospital, checkout time is 11:00 AM the day of discharge.   Patients discharged the day of surgery will not be allowed to drive home. Upper Endoscopy, Adult Upper endoscopy is a procedure to look inside the upper GI (gastrointestinal) tract. The upper GI tract is made up of:  The part of the body that moves food from your mouth to your stomach (esophagus).  The stomach.  The first part of your small intestine (duodenum). This procedure is also called esophagogastroduodenoscopy (EGD) or gastroscopy. In this procedure, your health care provider passes a thin, flexible tube (endoscope) through your mouth and down your esophagus into your stomach. A small camera is attached to the end of the tube. Images from the camera appear on a monitor in the exam room. During this procedure, your health care provider may also remove a small piece of tissue to be sent to a lab and examined under a microscope (biopsy). Your health care provider may do an upper endoscopy to diagnose  cancers of the upper GI tract. You may also have this procedure to find the cause of other conditions, such as:  Stomach pain.  Heartburn.  Pain or problems when swallowing.  Nausea and vomiting.  Stomach bleeding.  Stomach ulcers. Tell a health care provider about:  Any allergies you have.  All medicines you are taking, including vitamins, herbs, eye drops, creams, and over-the-counter medicines.  Any problems you or family members have had with anesthetic medicines.  Any blood disorders you have.  Any surgeries you have had.  Any medical conditions you have.  Whether you are pregnant or may be pregnant. What are the risks? Generally, this is a safe procedure. However, problems may occur, including:  Infection.  Bleeding.  Allergic reactions to medicines.  A tear or hole (perforation) in the esophagus, stomach, or duodenum. What happens before the procedure? Staying hydrated Follow instructions from your health care provider about hydration, which may include:  Up to 2 hours before the procedure - you may continue to drink clear liquids, such as water, clear  fruit juice, black coffee, and plain tea.  Eating and drinking restrictions Follow instructions from your health care provider about eating and drinking, which may include:  8 hours before the procedure - stop eating heavy meals or foods, such as meat, fried foods, or fatty foods.  6 hours before the procedure - stop eating light meals or foods, such as toast or cereal.  6 hours before the procedure - stop drinking milk or drinks that contain milk.  2 hours before the procedure - stop drinking clear liquids. Medicines Ask your health care provider about:  Changing or stopping your regular medicines. This is especially important if you are taking diabetes medicines or blood thinners.  Taking medicines such as aspirin and ibuprofen. These medicines can thin your blood. Do not take these medicines unless  your health care provider tells you to take them.  Taking over-the-counter medicines, vitamins, herbs, and supplements. General instructions  Plan to have someone take you home from the hospital or clinic.  If you will be going home right after the procedure, plan to have someone with you for 24 hours.  Ask your health care provider what steps will be taken to help prevent infection. What happens during the procedure?  1. An IV will be inserted into one of your veins. 2. You may be given one or more of the following: ? A medicine to help you relax (sedative). ? A medicine to numb the throat (local anesthetic). 3. You will lie on your left side on an exam table. 4. Your health care provider will pass the endoscope through your mouth and down your esophagus. 5. Your health care provider will use the scope to check the inside of your esophagus, stomach, and duodenum. Biopsies may be taken. 6. The endoscope will be removed. The procedure may vary among health care providers and hospitals. What happens after the procedure?  Your blood pressure, heart rate, breathing rate, and blood oxygen level will be monitored until you leave the hospital or clinic.  Do not drive for 24 hours if you were given a sedative during your procedure.  When your throat is no longer numb, you may be given some fluids to drink.  It is up to you to get the results of your procedure. Ask your health care provider, or the department that is doing the procedure, when your results will be ready. Summary  Upper endoscopy is a procedure to look inside the upper GI tract.  During the procedure, an IV will be inserted into one of your veins. You may be given a medicine to help you relax.  A medicine will be used to numb your throat.  The endoscope will be passed through your mouth and down your esophagus. This information is not intended to replace advice given to you by your health care provider. Make sure you  discuss any questions you have with your health care provider. Document Revised: 09/06/2017 Document Reviewed: 08/14/2017 Elsevier Patient Education  [place].  EndoscopyCare After  Please read the instructions outlined below and refer to this sheet in the next few weeks. These discharge instructions provide you with general information on caring for yourself after you leave the hospital. Your doctor may also give you specific instructions. While your treatment has been planned according to the most current medical practices available, unavoidable complications occasionally occur. If you have any problems or questions after discharge, please call your doctor. HOME CARE INSTRUCTIONS Activity  You may resume your regular activity but move at a slower pace for the next 24 hours.   Take frequent rest periods for the next 24 hours.   Walking will help expel (get rid of) the air and reduce the bloated feeling in your abdomen.   No driving for 24 hours (because of the anesthesia (medicine) used during the test).   You may shower.   Do not sign any important legal documents or operate any machinery for 24 hours (because of the anesthesia used during the test).  Nutrition  Drink plenty of fluids.   You may resume your normal diet.   Begin with a light meal and progress to your normal diet.   Avoid alcoholic beverages for 24 hours or as instructed by your caregiver.  Medications You may resume your normal medications unless your caregiver tells you otherwise. What you can expect today  You may experience abdominal discomfort such as a feeling of fullness or "gas" pains.   You may experience a sore throat for 2 to 3 days. This is normal. Gargling with salt water may help this.  Follow-up Your doctor will discuss the results of your test with  you. SEEK IMMEDIATE MEDICAL CARE IF:  You have excessive nausea (feeling sick to your stomach) and/or vomiting.   You have severe abdominal pain and distention (swelling).   You have trouble swallowing.   You have a temperature over 100 F (37.8 C).   You have rectal bleeding or vomiting of blood.  Document Released: 10/27/2003 Document Revised: 03/03/2011 Document Reviewed: 05/09/2007

## 2019-07-10 ENCOUNTER — Encounter (HOSPITAL_COMMUNITY)
Admission: RE | Admit: 2019-07-10 | Discharge: 2019-07-10 | Disposition: A | Discharge status: Home / Self Care | Payer: Medicare HMO | Source: Ambulatory Visit | Attending: Internal Medicine | Admitting: Internal Medicine

## 2019-07-10 ENCOUNTER — Other Ambulatory Visit (HOSPITAL_COMMUNITY)
Admission: RE | Admit: 2019-07-10 | Discharge: 2019-07-10 | Disposition: A | Discharge status: Home / Self Care | Payer: Medicare HMO | Source: Ambulatory Visit | Attending: Internal Medicine | Admitting: Internal Medicine

## 2019-07-10 ENCOUNTER — Other Ambulatory Visit: Payer: Self-pay

## 2019-07-10 ENCOUNTER — Encounter (HOSPITAL_COMMUNITY): Payer: Self-pay

## 2019-07-10 DIAGNOSIS — Z01812 Encounter for preprocedural laboratory examination: Secondary | ICD-10-CM | POA: Insufficient documentation

## 2019-07-10 DIAGNOSIS — D132 Benign neoplasm of duodenum: Secondary | ICD-10-CM | POA: Diagnosis not present

## 2019-07-10 DIAGNOSIS — Z20822 Contact with and (suspected) exposure to covid-19: Secondary | ICD-10-CM | POA: Diagnosis not present

## 2019-07-10 HISTORY — DX: Cardiac arrhythmia, unspecified: I49.9

## 2019-07-10 LAB — CBC
HCT: 27 % — ABNORMAL LOW (ref 39.0–52.0)
Hemoglobin: 8 g/dL — ABNORMAL LOW (ref 13.0–17.0)
MCH: 22.3 pg — ABNORMAL LOW (ref 26.0–34.0)
MCHC: 29.6 g/dL — ABNORMAL LOW (ref 30.0–36.0)
MCV: 75.4 fL — ABNORMAL LOW (ref 80.0–100.0)
Platelets: 188 10*3/uL (ref 150–400)
RBC: 3.58 MIL/uL — ABNORMAL LOW (ref 4.22–5.81)
RDW: 18.1 % — ABNORMAL HIGH (ref 11.5–15.5)
WBC: 7.1 10*3/uL (ref 4.0–10.5)
nRBC: 0 % (ref 0.0–0.2)

## 2019-07-10 LAB — BASIC METABOLIC PANEL
Anion gap: 14 (ref 5–15)
BUN: 27 mg/dL — ABNORMAL HIGH (ref 8–23)
CO2: 26 mmol/L (ref 22–32)
Calcium: 9.4 mg/dL (ref 8.9–10.3)
Chloride: 98 mmol/L (ref 98–111)
Creatinine, Ser: 1.27 mg/dL — ABNORMAL HIGH (ref 0.61–1.24)
GFR calc Af Amer: >60 mL/min (ref >60)
GFR calc non Af Amer: 52 mL/min — ABNORMAL LOW (ref >60)
Glucose, Bld: 240 mg/dL — ABNORMAL HIGH (ref 70–99)
Potassium: 4.1 mmol/L (ref 3.5–5.1)
Sodium: 138 mmol/L (ref 135–145)

## 2019-07-10 LAB — SARS CORONAVIRUS 2 (TAT 6-24 HRS): SARS Coronavirus 2: NEGATIVE

## 2019-07-12 ENCOUNTER — Encounter (HOSPITAL_COMMUNITY)
Admission: RE | Disposition: A | Discharge status: Home / Self Care | Payer: Self-pay | Source: Home / Self Care | Attending: Internal Medicine

## 2019-07-12 ENCOUNTER — Other Ambulatory Visit: Payer: Self-pay

## 2019-07-12 ENCOUNTER — Ambulatory Visit (HOSPITAL_COMMUNITY): Payer: Medicare HMO | Admitting: Anesthesiology

## 2019-07-12 ENCOUNTER — Encounter (HOSPITAL_COMMUNITY): Payer: Self-pay | Admitting: Internal Medicine

## 2019-07-12 ENCOUNTER — Ambulatory Visit (HOSPITAL_COMMUNITY)
Admission: RE | Admit: 2019-07-12 | Discharge: 2019-07-12 | Disposition: A | Discharge status: Home / Self Care | Payer: Medicare HMO | Attending: Internal Medicine | Admitting: Internal Medicine

## 2019-07-12 DIAGNOSIS — Z8249 Family history of ischemic heart disease and other diseases of the circulatory system: Secondary | ICD-10-CM | POA: Insufficient documentation

## 2019-07-12 DIAGNOSIS — I110 Hypertensive heart disease with heart failure: Secondary | ICD-10-CM | POA: Diagnosis not present

## 2019-07-12 DIAGNOSIS — K219 Gastro-esophageal reflux disease without esophagitis: Secondary | ICD-10-CM | POA: Insufficient documentation

## 2019-07-12 DIAGNOSIS — Z833 Family history of diabetes mellitus: Secondary | ICD-10-CM | POA: Diagnosis not present

## 2019-07-12 DIAGNOSIS — Z888 Allergy status to other drugs, medicaments and biological substances status: Secondary | ICD-10-CM | POA: Insufficient documentation

## 2019-07-12 DIAGNOSIS — E782 Mixed hyperlipidemia: Secondary | ICD-10-CM | POA: Insufficient documentation

## 2019-07-12 DIAGNOSIS — Z881 Allergy status to other antibiotic agents status: Secondary | ICD-10-CM | POA: Diagnosis not present

## 2019-07-12 DIAGNOSIS — Z7902 Long term (current) use of antithrombotics/antiplatelets: Secondary | ICD-10-CM | POA: Insufficient documentation

## 2019-07-12 DIAGNOSIS — Z955 Presence of coronary angioplasty implant and graft: Secondary | ICD-10-CM | POA: Insufficient documentation

## 2019-07-12 DIAGNOSIS — Z7901 Long term (current) use of anticoagulants: Secondary | ICD-10-CM | POA: Insufficient documentation

## 2019-07-12 DIAGNOSIS — I480 Paroxysmal atrial fibrillation: Secondary | ICD-10-CM | POA: Insufficient documentation

## 2019-07-12 DIAGNOSIS — E119 Type 2 diabetes mellitus without complications: Secondary | ICD-10-CM | POA: Diagnosis not present

## 2019-07-12 DIAGNOSIS — Z794 Long term (current) use of insulin: Secondary | ICD-10-CM | POA: Insufficient documentation

## 2019-07-12 DIAGNOSIS — Z862 Personal history of diseases of the blood and blood-forming organs and certain disorders involving the immune mechanism: Secondary | ICD-10-CM

## 2019-07-12 DIAGNOSIS — I2510 Atherosclerotic heart disease of native coronary artery without angina pectoris: Secondary | ICD-10-CM | POA: Insufficient documentation

## 2019-07-12 DIAGNOSIS — M479 Spondylosis, unspecified: Secondary | ICD-10-CM | POA: Diagnosis not present

## 2019-07-12 DIAGNOSIS — E1151 Type 2 diabetes mellitus with diabetic peripheral angiopathy without gangrene: Secondary | ICD-10-CM | POA: Diagnosis not present

## 2019-07-12 DIAGNOSIS — Z87891 Personal history of nicotine dependence: Secondary | ICD-10-CM | POA: Diagnosis not present

## 2019-07-12 DIAGNOSIS — Z885 Allergy status to narcotic agent status: Secondary | ICD-10-CM | POA: Insufficient documentation

## 2019-07-12 DIAGNOSIS — G4730 Sleep apnea, unspecified: Secondary | ICD-10-CM | POA: Diagnosis not present

## 2019-07-12 DIAGNOSIS — Z09 Encounter for follow-up examination after completed treatment for conditions other than malignant neoplasm: Secondary | ICD-10-CM

## 2019-07-12 DIAGNOSIS — J449 Chronic obstructive pulmonary disease, unspecified: Secondary | ICD-10-CM | POA: Diagnosis not present

## 2019-07-12 DIAGNOSIS — D500 Iron deficiency anemia secondary to blood loss (chronic): Secondary | ICD-10-CM | POA: Insufficient documentation

## 2019-07-12 DIAGNOSIS — K3189 Other diseases of stomach and duodenum: Secondary | ICD-10-CM | POA: Insufficient documentation

## 2019-07-12 DIAGNOSIS — D132 Benign neoplasm of duodenum: Secondary | ICD-10-CM

## 2019-07-12 DIAGNOSIS — Z8719 Personal history of other diseases of the digestive system: Secondary | ICD-10-CM

## 2019-07-12 DIAGNOSIS — Z79899 Other long term (current) drug therapy: Secondary | ICD-10-CM | POA: Diagnosis not present

## 2019-07-12 DIAGNOSIS — I5032 Chronic diastolic (congestive) heart failure: Secondary | ICD-10-CM | POA: Diagnosis not present

## 2019-07-12 DIAGNOSIS — T454X5S Adverse effect of iron and its compounds, sequela: Secondary | ICD-10-CM

## 2019-07-12 DIAGNOSIS — I5033 Acute on chronic diastolic (congestive) heart failure: Secondary | ICD-10-CM | POA: Diagnosis not present

## 2019-07-12 HISTORY — PX: ESOPHAGOGASTRODUODENOSCOPY (EGD) WITH PROPOFOL: SHX5813

## 2019-07-12 LAB — GLUCOSE, CAPILLARY: Glucose-Capillary: 119 mg/dL — ABNORMAL HIGH (ref 70–99)

## 2019-07-12 SURGERY — ESOPHAGOGASTRODUODENOSCOPY (EGD) WITH PROPOFOL
Anesthesia: General

## 2019-07-12 MED ORDER — PROPOFOL 500 MG/50ML IV EMUL
INTRAVENOUS | Status: DC | PRN
  Administered 2019-07-12 (×2): 100 ug/kg/min via INTRAVENOUS

## 2019-07-12 MED ORDER — CHLORHEXIDINE GLUCONATE CLOTH 2 % EX PADS: 6.0000 | MEDICATED_PAD | Freq: Once | CUTANEOUS | Status: DC

## 2019-07-12 MED ORDER — KETAMINE HCL 10 MG/ML IJ SOLN
INTRAMUSCULAR | Status: DC | PRN
  Administered 2019-07-12: 20 mg via INTRAVENOUS

## 2019-07-12 MED ORDER — KETAMINE HCL 50 MG/5ML IJ SOSY
PREFILLED_SYRINGE | INTRAMUSCULAR | Status: AC
  Filled 2019-07-12: qty 5

## 2019-07-12 MED ORDER — LACTATED RINGERS IV SOLN: Freq: Once | INTRAVENOUS | Status: DC

## 2019-07-12 MED ORDER — SODIUM CHLORIDE 0.9 % IV SOLN
510.0000 mg | INTRAVENOUS | Status: DC
  Administered 2019-07-12: 510 mg via INTRAVENOUS
  Filled 2019-07-12: qty 17

## 2019-07-12 MED ORDER — LACTATED RINGERS IV SOLN: INTRAVENOUS | Status: DC | PRN

## 2019-07-12 NOTE — Op Note (Signed)
[facility] Patient Name: Mike Wright Procedure Date: 07/12/2019 12:08 PM MRN: [id number] Date of Birth: 16-Jan-1934 Attending MD: Hildred Laser , MD CSN: [id number] Age: 86 Admit Type: Outpatient Procedure:                Upper GI endoscopy Indications:              Surveillance procedure Providers:                Hildred Laser, MD, Jeanann Lewandowsky. Sharon Seller, RN, Raphael Gibney, Technician Referring MD:             Fuller [name] Manuella Ghazi, MD Medicines:                Propofol per Anesthesia Complications:            No immediate complications. Estimated Blood Loss:     Estimated blood loss: none. Procedure:                Pre-Anesthesia Assessment:                           - Prior to the procedure, a History and Physical                            was performed, and patient medications and                            allergies were reviewed. The patient's tolerance of                            previous anesthesia was also reviewed. The risks                            and benefits of the procedure and the sedation                            options and risks were discussed with the patient.                            All questions were answered, and informed consent                            was obtained. Prior Anticoagulants: The patient                            last took Eliquis (apixaban) 2 days and Plavix                            (clopidogrel) 5 days prior to the procedure. ASA                            Grade Assessment: III - A patient with severe  systemic disease. After reviewing the risks and                            benefits, the patient was deemed in satisfactory                            condition to undergo the procedure.                           After obtaining informed consent, the endoscope was                            passed under direct vision. Throughout the                            procedure, the patient's  blood pressure, pulse, and                            oxygen saturations were monitored continuously. The                            [id number]-[id number] scope was introduced through the                            mouth, and advanced to the second part of duodenum.                            The upper GI endoscopy was accomplished without                            difficulty. The patient tolerated the procedure                            well. Scope In: 1:07:07 PM Scope Out: 1:12:33 PM Total Procedure Duration: 0 hours 5 minutes 26 seconds  Findings:      The examined esophagus was normal.      The Z-line was regular.      The entire examined stomach was normal.      The duodenal bulb was normal.      A small post polypectomy scar was found in the second portion of the       duodenum. The scar tissue was healthy in appearance. There was no       evidence of the previous polyp. Impression:               - Normal esophagus.                           - Z-line regular.                           - Normal stomach.                           - Normal duodenal bulb.                           -  Duodenal scar from previous polypectomy without                            residual polyp.                           - No specimens collected. Moderate Sedation:      Per Anesthesia Care Recommendation:           - Patient has a contact number available for                            emergencies. The signs and symptoms of potential                            delayed complications were discussed with the                            patient. Return to normal activities tomorrow.                            Written discharge instructions were provided to the                            patient.                           - Resume previous diet today.                           - Continue present medications.                           - Resume Eliquis (apixaban) today and Plavix                             (clopidogrel) today at prior doses.                           - Feraheme 510 mg IV infusion today and in 2 weeks. Procedure Code(s):        --- Professional ---                           [id number], Esophagogastroduodenoscopy, flexible,                            transoral; diagnostic, including collection of                            specimen(s) by brushing or washing, when performed                            (separate procedure) Diagnosis Code(s):        --- Professional ---                           K31.89, Other diseases of stomach and  duodenum CPT copyright 2019 American Medical Association. All rights reserved. The codes documented in this report are preliminary and upon coder review may  be revised to meet current compliance requirements. Hildred Laser, MD Hildred Laser, MD 07/12/2019 1:22:15 PM This report has been signed electronically. Number of Addenda: 0

## 2019-07-12 NOTE — H&P (Signed)
Mike Wright is an 86 y.o. male.   Chief Complaint: Patient is here for esophagogastroduodenoscopy. HPI: Patient is 86 year old Caucasian male with multiple medical problems who was hospitalized back in September 2020 for iron deficiency anemia and GI bleed.  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  One was cold snared and the other one was elevated albumin and removed piecemeal.  He he is now returning to reexamine the site of the larger polyp and make sure there is no residual or recurrent polyp.  Few weeks ago he noted small amount of bright red blood with his bowel movement.  He denies nausea vomiting or abdominal pain.  He remains on iron pills he takes 1-2 a day.  His hemoglobin has been drifting down.  2 days ago was 8 g and MCV 75.4.  Patient can tell that his hemoglobin is low.  He is not having chest pain or shortness of breath with his sedentary lifestyle. Plavix and Eliquis are on hold for this procedure.  Past Medical History:  Diagnosis Date  . Anemia    a. mild, noted 04/2017.  [name] CAD in native artery    a. [name] 04/2017 s/p DES to D1, DES to prox-mid LAD, DES to prox LAD overlapping the prior stent, LVEF 55-65%.   . Chronic diastolic CHF (congestive heart failure) ([place])   . Constipation   . COPD (chronic obstructive pulmonary disease) ([place])   . Diabetic ulcer of toe ([place])   . DJD (degenerative joint disease) of cervical spine   . Dysrhythmia    AFib  . Essential hypertension   . GERD (gastroesophageal reflux disease)   . History of hiatal hernia   . HIT (heparin-induced thrombocytopenia) ([place])   . Hypothyroidism   . Hypoxia    a. went home on home O2 04/2017.  [name] Insomnia   . Mixed hyperlipidemia   . PAD (peripheral artery disease) ([place])   . PAF (paroxysmal atrial fibrillation) ([place])   . PVD (peripheral vascular disease) ([place])   . Renal insufficiency   . 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  . Sinus bradycardia    a. HR 30s-40s in 04/2017 -> diltiazem stopped,  metoprolol reduced.  . Sleep apnea    "chose not to order CPAP at this time" (05/18/2017)  . Type 2 diabetes mellitus ([place])   . Wheezing    a. suspected COPD 04/2017. Former tobacco x 40 years.    Past Surgical History:  Procedure Laterality Date  . ABDOMINAL AORTOGRAM W/LOWER EXTREMITY N/A 09/15/2017   Procedure: ABDOMINAL AORTOGRAM W/LOWER EXTREMITY;  Surgeon: Serafina Mitchell, MD;  Location: [place] [facility];  Service: Cardiovascular;  Laterality: N/A;  . AMPUTATION Bilateral 09/20/2017   Procedure: BILATERAL GREAT TOE AMPUTATIONS INCLUDING METATARSAL HEADS;  Surgeon: Serafina Mitchell, MD;  [facility];  Service: Vascular;  Laterality: Bilateral;  . AMPUTATION Right 03/01/2019   Procedure: AMPUTATION TOES;  Surgeon: Angelia Mould, MD;  Location: [place];  Service: Vascular;  Laterality: Right;  . AMPUTATION Right 05/29/2019   Procedure: AMPUTATION RIGHT THIRD TOE;  Surgeon: Serafina Mitchell, MD;  Location: [place];  Service: Vascular;  Laterality: Right;  . ANGIOPLASTY Right 09/20/2017   Procedure: ANGIOPLASTY RIGHT TIBIAL ARTERY;  Surgeon: Serafina Mitchell, MD;  Location: [place];  Service: Vascular;  Laterality: Right;  . APPENDECTOMY    . BACK SURGERY    . CARDIAC CATHETERIZATION  1980s; 2012;  . CATARACT EXTRACTION Left 2004  .  COLONOSCOPY  2004   FLEISHMAN TICS  . COLONOSCOPY WITH PROPOFOL N/A 11/28/2018   Procedure: COLONOSCOPY WITH PROPOFOL;  Surgeon: Rogene Houston, MD;  [facility];  Service: Endoscopy;  Laterality: N/A;  . CORONARY ANGIOPLASTY WITH STENT PLACEMENT  05/18/2017   "3 stents"  . CORONARY STENT INTERVENTION N/A 05/18/2017   Procedure: CORONARY STENT INTERVENTION;  Surgeon: Jettie Booze, MD;  Location: [place] [facility];  Service: Cardiovascular;  Laterality: N/A;  . ESOPHAGOGASTRODUODENOSCOPY (EGD) WITH PROPOFOL N/A 11/20/2017   Procedure: ESOPHAGOGASTRODUODENOSCOPY (EGD) WITH PROPOFOL;  Surgeon: Irene Shipper, MD;  Location: [place];   Service: Gastroenterology;  Laterality: N/A;  . ESOPHAGOGASTRODUODENOSCOPY (EGD) WITH PROPOFOL N/A 11/28/2018   Procedure: ESOPHAGOGASTRODUODENOSCOPY (EGD) WITH PROPOFOL;  Surgeon: Rogene Houston, MD;  [facility];  Service: Endoscopy;  Laterality: N/A;  . GIVENS CAPSULE STUDY N/A 12/17/2018   Procedure: GIVENS CAPSULE STUDY;  Surgeon: Rogene Houston, MD;  [facility];  Service: Endoscopy;  Laterality: N/A;  7:30am  . I & D EXTREMITY Right 12/17/2017   Procedure: IRRIGATION AND DEBRIDEMENT RIGHT GREAT TOE;  Surgeon: Serafina Mitchell, MD;  [facility];  Service: Vascular;  Laterality: Right;  . JOINT REPLACEMENT    . LEFT HEART CATH AND CORONARY ANGIOGRAPHY N/A 05/18/2017   Procedure: LEFT HEART CATH AND CORONARY ANGIOGRAPHY;  Surgeon: Jettie Booze, MD;  Location: [place] [facility];  Service: Cardiovascular;  Laterality: N/A;  . LOWER EXTREMITY ANGIOGRAM Right 09/20/2017   Procedure: RIGHT LOWER LEG  ANGIOGRAM;  Surgeon: Serafina Mitchell, MD;  Location: [place];  Service: Vascular;  Laterality: Right;  . LUMBAR FUSION  2002   L3, 4 L4, 5 L5 S1 Fused by Dr. Glenna Fellows  . PERIPHERAL VASCULAR BALLOON ANGIOPLASTY Left 09/15/2017   Procedure: PERIPHERAL VASCULAR BALLOON ANGIOPLASTY;  Surgeon: Serafina Mitchell, MD;  Location: [place] [facility];  Service: Cardiovascular;  Laterality: Left;  PTA of Peroneal & Posterior Tibial  . POLYPECTOMY  11/28/2018   Procedure: POLYPECTOMY;  Surgeon: Rogene Houston, MD;  [facility];  Service: Endoscopy;;  duodenum  . POSTERIOR LUMBAR FUSION    . RHINOPLASTY    . RIGHT HEART CATH N/A 05/18/2017   Procedure: RIGHT HEART CATH;  Surgeon: Jettie Booze, MD;  Location: [place] [facility];  Service: Cardiovascular;  Laterality: N/A;  . TEE WITHOUT CARDIOVERSION N/A 09/18/2017   Procedure: TRANSESOPHAGEAL ECHOCARDIOGRAM (TEE);  Surgeon: Fay Records, MD;  Location: [place];  Service: Cardiovascular;  Laterality: N/A;   . TOTAL KNEE ARTHROPLASTY Bilateral   . TRANSURETHRAL RESECTION OF PROSTATE  2001   [place]    Family History  Problem Relation Age of Onset  . Heart attack Father 70  . COPD Father   . COPD Mother   . Heart disease Mother   . Diabetes Mother   . Hypertension Sister   . CVA Sister   . Diabetes Sister   . Multiple sclerosis Sister    Social History:  reports that he quit smoking about 36 years ago. His smoking use included cigarettes. He started smoking about 66 years ago. He has a 60.00 pack-year smoking history. He has never used smokeless tobacco. He reports previous alcohol use. He reports that he does not use drugs.  Allergies:  Allergies  Allergen Reactions  . Bactrim [Sulfamethoxazole-Trimethoprim]     Pancytopenia  . Codeine Shortness Of Breath  . Heparin Other (See Comments)    +  HIT,  Severe bleeding (with heparin drip & large doses), tolerates low doses  . Losartan Swelling    Per ENT UNSPECIFIED REACTION   . Oxycodone Other (See Comments)    "Made me act out of my mind" Mental status changes hallucinations    Medications Prior to Admission  Medication Sig Dispense Refill  . acetaminophen (TYLENOL) 500 MG tablet Take 1 tablet (500 mg total) by mouth every 6 (six) hours as needed for mild pain or headache. 30 tablet 0  . atorvastatin (LIPITOR) 80 MG tablet Take 80 mg by mouth every evening.     . Cholecalciferol (VITAMIN D) 50 MCG (2000 UT) tablet Take 2,000 Units by mouth daily.    . clopidogrel (PLAVIX) 75 MG tablet Take 75 mg by mouth daily.    . Cyanocobalamin (B-12) 2500 MCG TABS Take 2,500 mcg by mouth daily.    [name] ELIQUIS 5 MG TABS tablet TAKE 1 TABLET TWICE DAILY (Patient taking differently: Take 5 mg by mouth 2 (two) times daily. ) [id number] tablet 3  . folic acid (FOLVITE) 1 MG tablet Take 1 tablet (1 mg total) by mouth daily. 30 tablet 1  . furosemide (LASIX) 40 MG tablet Take 40 mg by mouth 2 (two) times daily.     . insulin glargine (LANTUS) 100 UNIT/ML  injection Inject 30-40 Units into the skin See admin instructions. Inject 30 units SQ in the morning and inject 40 units SQ at bedtime    . isosorbide mononitrate (IMDUR) 30 MG 24 hr tablet TAKE 1/2 TABLET EVERY DAY (Patient taking differently: Take 15 mg by mouth daily. ) 45 tablet 3  . lisinopril (ZESTRIL) 5 MG tablet Take 0.5 tablets (2.5 mg total) by mouth daily. (Patient taking differently: Take 5 mg by mouth daily. )    . meclizine (ANTIVERT) 25 MG tablet Take 25 mg by mouth daily as needed for dizziness.    . metFORMIN (GLUCOPHAGE) 1000 MG tablet Take 1,000 mg by mouth 2 (two) times daily.    . nitroGLYCERIN (NITROSTAT) 0.4 MG SL tablet PLACE 1 TABLET (0.4 MG TOTAL) UNDER THE TONGUE EVERY 5  MINUTES AS NEEDED FOR CHEST PAIN. (Patient taking differently: Place 0.4 mg under the tongue every 5 (five) minutes as needed for chest pain. ) 25 tablet 3  . pantoprazole (PROTONIX) 40 MG tablet Take 1 tablet (40 mg total) by mouth 2 (two) times daily. To protect stomach while taking multiple blood thinners. 60 tablet 2  . Pediatric Multivitamins-Iron (FLINTSTONES COMPLETE) 18 MG CHEW Chew 1 tablet by mouth 2 (two) times daily. (Patient taking differently: Chew 1 tablet by mouth daily. )    . potassium chloride SA (K-DUR) 20 MEQ tablet Take 1 tablet (20 mEq total) by mouth daily. 30 tablet 1  . psyllium (METAMUCIL) 58.6 % packet Take 0.5 packets by mouth 2 (two) times daily.     . traZODone (DESYREL) 100 MG tablet Take 100 mg by mouth at bedtime.     . cephALEXin (KEFLEX) 500 MG capsule Take 1 capsule (500 mg total) by mouth 3 (three) times daily. (Patient not taking: Reported on 07/04/2019) 21 capsule 0  . ondansetron (ZOFRAN) 4 MG tablet Take 1 tablet (4 mg total) by mouth every 6 (six) hours as needed for nausea. (Patient not taking: Reported on 07/04/2019) 20 tablet 0  . traMADol (ULTRAM) 50 MG tablet Take 1 tablet (50 mg total) by mouth every 6 (six) hours as needed. (Patient not taking: Reported on  07/04/2019) 20 tablet  0    Results for orders placed or performed during the hospital encounter of 07/12/19 (from the past 48 hour(s))  Glucose, capillary     Status: Abnormal   Collection Time: 07/12/19 11:58 AM  Result Value Ref Range   Glucose-Capillary 119 (H) 70 - 99 mg/dL    Comment: Glucose reference range applies only to samples taken after fasting for at least 8 hours.   No results found.  Review of Systems  Blood pressure 115/63, pulse 69, resp. rate 17, SpO2 98 %. Physical Exam  Constitutional: He appears well-developed and well-nourished.  HENT:  Mouth/Throat: Oropharynx is clear and moist.  Eyes: Conjunctivae are normal. No scleral icterus.  Neck: No thyromegaly present.  Cardiovascular: Regular rhythm.  Murmur heard. Irregular rhythm normal S1 and S2.  Grade 3/6 systolic murmur noted.  It is best heard at aortic area.  Respiratory: Effort normal and breath sounds normal.  GI: Soft. He exhibits no distension and no mass. There is no abdominal tenderness.  Musculoskeletal:        General: No edema.  Lymphadenopathy:    He has no cervical adenopathy.  Neurological: He is alert.  Skin: Skin is warm and dry.     Assessment/Plan History of duodenal adenomas. Surveillance EGD. History of iron deficiency anemia secondary chronic GI bleed.  Patient not responding to p.o. iron. Patient will be given single dose of Feraheme infusion during this visit and second dose in 2 weeks.  Hildred Laser, MD 07/12/2019, 12:28 PM

## 2019-07-12 NOTE — Discharge Instructions (Signed)
Resume usual medications including clopidogrel and Eliquis as before. Resume usual diet. No driving for 24 hours. Feraheme  infusion in 2 weeks.  Upper Endoscopy, Adult, Care After This sheet gives you information about how to care for yourself after your procedure. Your health care provider may also give you more specific instructions. If you have problems or questions, contact your health care provider. What can I expect after the procedure? After the procedure, it is common to have:  A sore throat.  Mild stomach pain or discomfort.  Bloating.  Nausea. Follow these instructions at home:   Follow instructions from your health care provider about what to eat or drink after your procedure.  Return to your normal activities as told by your health care provider. Ask your health care provider what activities are safe for you.  Take over-the-counter and prescription medicines only as told by your health care provider.  Do not drive for 24 hours if you were given a sedative during your procedure.  Keep all follow-up visits as told by your health care provider. This is important. Contact a health care provider if you have:  A sore throat that lasts longer than one day.  Trouble swallowing. Get help right away if:  You vomit blood or your vomit looks like coffee grounds.  You have: ? A fever. ? Bloody, black, or tarry stools. ? A severe sore throat or you cannot swallow. ? Difficulty breathing. ? Severe pain in your chest or abdomen. Summary  After the procedure, it is common to have a sore throat, mild stomach discomfort, bloating, and nausea.  Do not drive for 24 hours if you were given a sedative during the procedure.  Follow instructions from your health care provider about what to eat or drink after your procedure.  Return to your normal activities as told by your health care provider. This information is not intended to replace advice given to you by your health  care provider. Make sure you discuss any questions you have with your health care provider. Document Revised: 09/05/2017 Document Reviewed: 08/14/2017 Elsevier Patient Education  [place].

## 2019-07-12 NOTE — Anesthesia Preprocedure Evaluation (Signed)
Anesthesia Evaluation  Patient identified by MRN, date of birth, ID band Patient awake    Reviewed: Allergy & Precautions, NPO status , Patient's Chart, lab work & pertinent test results  Airway Mallampati: II  TM Distance: >3 FB Neck ROM: Full    Dental  (+) Upper Dentures, Missing, Dental Advisory Given,    Pulmonary shortness of breath and with exertion, sleep apnea , COPD, former smoker,    breath sounds clear to auscultation       Cardiovascular hypertension, Pt. on medications and Pt. on home beta blockers + angina at rest + CAD, + Cardiac Stents, + Peripheral Vascular Disease and +CHF  + dysrhythmias Atrial Fibrillation  Rhythm:Regular Rate:Normal  Reviewed cardiology note by Dr.Branch   Neuro/Psych    GI/Hepatic hiatal hernia, GERD  Medicated,  Endo/Other  diabetes, Well Controlled, Type 2, Insulin Dependent, Oral Hypoglycemic AgentsHypothyroidism   Renal/GU Renal disease     Musculoskeletal  (+) Arthritis ,   Abdominal   Peds  Hematology  (+) Blood dyscrasia, anemia ,   Anesthesia Other Findings   Reproductive/Obstetrics                             Anesthesia Physical  Anesthesia Plan  ASA: III  Anesthesia Plan: General   Post-op Pain Management:    Induction: Intravenous  PONV Risk Score and Plan: 2 and Treatment may vary due to age or medical condition and Propofol infusion  Airway Management Planned: Nasal Cannula and Natural Airway  Additional Equipment:   Intra-op Plan:   Post-operative Plan:   Informed Consent: I have reviewed the patients History and Physical, chart, labs and discussed the procedure including the risks, benefits and alternatives for the proposed anesthesia with the patient or authorized representative who has indicated his/her understanding and acceptance.     Dental advisory given  Plan Discussed with: CRNA and Surgeon  Anesthesia Plan  Comments: (   )        Anesthesia Quick Evaluation

## 2019-07-12 NOTE — Transfer of Care (Signed)
Immediate Anesthesia Transfer of Care Note  Patient: Mike Wright  Procedure(s) Performed: ESOPHAGOGASTRODUODENOSCOPY (EGD) WITH PROPOFOL (N/A )  Patient Location: PACU  Anesthesia Type:General  Level of Consciousness: awake, alert  and oriented  Airway & Oxygen Therapy: Patient Spontanous Breathing  Post-op Assessment: Report given to RN and Post -op Vital signs reviewed and stable  Post vital signs: Reviewed and stable  Last Vitals:  Vitals Value Taken Time  BP 98/40 07/12/19 1318  Temp    Pulse 85 07/12/19 1320  Resp 27 07/12/19 1320  SpO2 100 % 07/12/19 1320  Vitals shown include unvalidated device data.  Last Pain:  Vitals:   07/12/19 1258  PainSc: 3       Patients Stated Pain Goal: 5 ([id number])  Complications: No apparent anesthesia complications

## 2019-07-12 NOTE — Anesthesia Postprocedure Evaluation (Signed)
Anesthesia Post Note  Patient: Mike Wright  Procedure(s) Performed: ESOPHAGOGASTRODUODENOSCOPY (EGD) WITH PROPOFOL (N/A )  Patient location during evaluation: PACU Anesthesia Type: General Level of consciousness: awake, oriented, awake and alert and patient cooperative Pain management: pain level controlled Vital Signs Assessment: post-procedure vital signs reviewed and stable Respiratory status: spontaneous breathing, respiratory function stable and nonlabored ventilation Cardiovascular status: stable Postop Assessment: no apparent nausea or vomiting Anesthetic complications: no     Last Vitals:  Vitals:   07/12/19 1202 07/12/19 1320  BP: 115/63 (!) 98/40  Pulse: 69 85  Resp: 17 (!) 27  Temp:  (P) 36.5 C  SpO2: 98% 100%    Last Pain:  Vitals:   07/12/19 1258  PainSc: 3                  Mike Wright

## 2019-07-26 ENCOUNTER — Emergency Department (HOSPITAL_COMMUNITY): Payer: Medicare HMO

## 2019-07-26 ENCOUNTER — Encounter (HOSPITAL_COMMUNITY)
Admit: 2019-07-26 | Discharge: 2019-07-26 | Disposition: A | Discharge status: Home / Self Care | Payer: Medicare HMO | Attending: Internal Medicine | Admitting: Internal Medicine

## 2019-07-26 ENCOUNTER — Encounter (HOSPITAL_COMMUNITY): Payer: Self-pay | Admitting: Emergency Medicine

## 2019-07-26 ENCOUNTER — Inpatient Hospital Stay (HOSPITAL_COMMUNITY)
Admission: EM | Admit: 2019-07-26 | Discharge: 2019-07-28 | DRG: 916 | Disposition: A | Discharge status: Home / Self Care | Payer: Medicare HMO | Source: Ambulatory Visit | Attending: Family Medicine | Admitting: Family Medicine

## 2019-07-26 ENCOUNTER — Encounter (HOSPITAL_COMMUNITY): Payer: Self-pay

## 2019-07-26 ENCOUNTER — Other Ambulatory Visit: Payer: Self-pay

## 2019-07-26 ENCOUNTER — Other Ambulatory Visit (HOSPITAL_COMMUNITY): Payer: Self-pay

## 2019-07-26 DIAGNOSIS — D509 Iron deficiency anemia, unspecified: Secondary | ICD-10-CM | POA: Diagnosis present

## 2019-07-26 DIAGNOSIS — J449 Chronic obstructive pulmonary disease, unspecified: Secondary | ICD-10-CM | POA: Diagnosis present

## 2019-07-26 DIAGNOSIS — T886XXA Anaphylactic reaction due to adverse effect of correct drug or medicament properly administered, initial encounter: Principal | ICD-10-CM | POA: Diagnosis present

## 2019-07-26 DIAGNOSIS — N183 Chronic kidney disease, stage 3 unspecified: Secondary | ICD-10-CM | POA: Diagnosis not present

## 2019-07-26 DIAGNOSIS — E119 Type 2 diabetes mellitus without complications: Secondary | ICD-10-CM | POA: Insufficient documentation

## 2019-07-26 DIAGNOSIS — Z20822 Contact with and (suspected) exposure to covid-19: Secondary | ICD-10-CM | POA: Diagnosis present

## 2019-07-26 DIAGNOSIS — I739 Peripheral vascular disease, unspecified: Secondary | ICD-10-CM | POA: Diagnosis not present

## 2019-07-26 DIAGNOSIS — J9611 Chronic respiratory failure with hypoxia: Secondary | ICD-10-CM | POA: Diagnosis not present

## 2019-07-26 DIAGNOSIS — Z89411 Acquired absence of right great toe: Secondary | ICD-10-CM

## 2019-07-26 DIAGNOSIS — E1122 Type 2 diabetes mellitus with diabetic chronic kidney disease: Secondary | ICD-10-CM | POA: Diagnosis not present

## 2019-07-26 DIAGNOSIS — Z89431 Acquired absence of right foot: Secondary | ICD-10-CM

## 2019-07-26 DIAGNOSIS — I10 Essential (primary) hypertension: Secondary | ICD-10-CM | POA: Diagnosis present

## 2019-07-26 DIAGNOSIS — Z981 Arthrodesis status: Secondary | ICD-10-CM

## 2019-07-26 DIAGNOSIS — Z955 Presence of coronary angioplasty implant and graft: Secondary | ICD-10-CM

## 2019-07-26 DIAGNOSIS — D631 Anemia in chronic kidney disease: Secondary | ICD-10-CM | POA: Diagnosis not present

## 2019-07-26 DIAGNOSIS — R079 Chest pain, unspecified: Secondary | ICD-10-CM | POA: Diagnosis not present

## 2019-07-26 DIAGNOSIS — Z794 Long term (current) use of insulin: Secondary | ICD-10-CM

## 2019-07-26 DIAGNOSIS — Z9861 Coronary angioplasty status: Secondary | ICD-10-CM | POA: Diagnosis not present

## 2019-07-26 DIAGNOSIS — T782XXA Anaphylactic shock, unspecified, initial encounter: Secondary | ICD-10-CM | POA: Diagnosis present

## 2019-07-26 DIAGNOSIS — G4733 Obstructive sleep apnea (adult) (pediatric): Secondary | ICD-10-CM | POA: Diagnosis present

## 2019-07-26 DIAGNOSIS — Z89412 Acquired absence of left great toe: Secondary | ICD-10-CM

## 2019-07-26 DIAGNOSIS — I130 Hypertensive heart and chronic kidney disease with heart failure and stage 1 through stage 4 chronic kidney disease, or unspecified chronic kidney disease: Secondary | ICD-10-CM | POA: Diagnosis not present

## 2019-07-26 DIAGNOSIS — I25118 Atherosclerotic heart disease of native coronary artery with other forms of angina pectoris: Secondary | ICD-10-CM | POA: Diagnosis not present

## 2019-07-26 DIAGNOSIS — E1151 Type 2 diabetes mellitus with diabetic peripheral angiopathy without gangrene: Secondary | ICD-10-CM | POA: Diagnosis present

## 2019-07-26 DIAGNOSIS — G9340 Encephalopathy, unspecified: Secondary | ICD-10-CM | POA: Diagnosis present

## 2019-07-26 DIAGNOSIS — I480 Paroxysmal atrial fibrillation: Secondary | ICD-10-CM | POA: Diagnosis not present

## 2019-07-26 DIAGNOSIS — K219 Gastro-esophageal reflux disease without esophagitis: Secondary | ICD-10-CM | POA: Diagnosis present

## 2019-07-26 DIAGNOSIS — Y848 Other medical procedures as the cause of abnormal reaction of the patient, or of later complication, without mention of misadventure at the time of the procedure: Secondary | ICD-10-CM | POA: Diagnosis present

## 2019-07-26 DIAGNOSIS — R338 Other retention of urine: Secondary | ICD-10-CM | POA: Diagnosis present

## 2019-07-26 DIAGNOSIS — T782XXD Anaphylactic shock, unspecified, subsequent encounter: Secondary | ICD-10-CM | POA: Diagnosis not present

## 2019-07-26 DIAGNOSIS — Z825 Family history of asthma and other chronic lower respiratory diseases: Secondary | ICD-10-CM

## 2019-07-26 DIAGNOSIS — N179 Acute kidney failure, unspecified: Secondary | ICD-10-CM | POA: Diagnosis present

## 2019-07-26 DIAGNOSIS — E782 Mixed hyperlipidemia: Secondary | ICD-10-CM | POA: Diagnosis present

## 2019-07-26 DIAGNOSIS — Y92538 Other ambulatory health services establishments as the place of occurrence of the external cause: Secondary | ICD-10-CM | POA: Diagnosis present

## 2019-07-26 DIAGNOSIS — I952 Hypotension due to drugs: Secondary | ICD-10-CM | POA: Diagnosis present

## 2019-07-26 DIAGNOSIS — R112 Nausea with vomiting, unspecified: Secondary | ICD-10-CM | POA: Diagnosis present

## 2019-07-26 DIAGNOSIS — I2510 Atherosclerotic heart disease of native coronary artery without angina pectoris: Secondary | ICD-10-CM | POA: Diagnosis not present

## 2019-07-26 DIAGNOSIS — T454X5A Adverse effect of iron and its compounds, initial encounter: Secondary | ICD-10-CM | POA: Diagnosis present

## 2019-07-26 DIAGNOSIS — I5042 Chronic combined systolic (congestive) and diastolic (congestive) heart failure: Secondary | ICD-10-CM | POA: Diagnosis not present

## 2019-07-26 DIAGNOSIS — Z87891 Personal history of nicotine dependence: Secondary | ICD-10-CM

## 2019-07-26 DIAGNOSIS — Z89432 Acquired absence of left foot: Secondary | ICD-10-CM

## 2019-07-26 DIAGNOSIS — Z79899 Other long term (current) drug therapy: Secondary | ICD-10-CM

## 2019-07-26 DIAGNOSIS — I208 Other forms of angina pectoris: Secondary | ICD-10-CM | POA: Diagnosis not present

## 2019-07-26 DIAGNOSIS — E039 Hypothyroidism, unspecified: Secondary | ICD-10-CM | POA: Diagnosis present

## 2019-07-26 DIAGNOSIS — E1152 Type 2 diabetes mellitus with diabetic peripheral angiopathy with gangrene: Secondary | ICD-10-CM | POA: Diagnosis not present

## 2019-07-26 DIAGNOSIS — Z8249 Family history of ischemic heart disease and other diseases of the circulatory system: Secondary | ICD-10-CM

## 2019-07-26 DIAGNOSIS — Z833 Family history of diabetes mellitus: Secondary | ICD-10-CM

## 2019-07-26 DIAGNOSIS — Z89421 Acquired absence of other right toe(s): Secondary | ICD-10-CM

## 2019-07-26 DIAGNOSIS — Z7901 Long term (current) use of anticoagulants: Secondary | ICD-10-CM

## 2019-07-26 DIAGNOSIS — Z888 Allergy status to other drugs, medicaments and biological substances status: Secondary | ICD-10-CM

## 2019-07-26 DIAGNOSIS — J431 Panlobular emphysema: Secondary | ICD-10-CM | POA: Diagnosis not present

## 2019-07-26 DIAGNOSIS — R0602 Shortness of breath: Secondary | ICD-10-CM | POA: Diagnosis not present

## 2019-07-26 DIAGNOSIS — Z881 Allergy status to other antibiotic agents status: Secondary | ICD-10-CM

## 2019-07-26 DIAGNOSIS — Z885 Allergy status to narcotic agent status: Secondary | ICD-10-CM

## 2019-07-26 DIAGNOSIS — Z7902 Long term (current) use of antithrombotics/antiplatelets: Secondary | ICD-10-CM

## 2019-07-26 DIAGNOSIS — R339 Retention of urine, unspecified: Secondary | ICD-10-CM | POA: Diagnosis present

## 2019-07-26 DIAGNOSIS — I482 Chronic atrial fibrillation, unspecified: Secondary | ICD-10-CM | POA: Diagnosis not present

## 2019-07-26 DIAGNOSIS — D649 Anemia, unspecified: Secondary | ICD-10-CM | POA: Diagnosis not present

## 2019-07-26 LAB — CBC WITH DIFFERENTIAL/PLATELET
Abs Immature Granulocytes: 0.2 10*3/uL — ABNORMAL HIGH (ref 0.00–0.07)
Basophils Absolute: 0 10*3/uL (ref 0.0–0.1)
Basophils Relative: 0 %
Eosinophils Absolute: 0 10*3/uL (ref 0.0–0.5)
Eosinophils Relative: 0 %
HCT: 40 % (ref 39.0–52.0)
Hemoglobin: 11.7 g/dL — ABNORMAL LOW (ref 13.0–17.0)
Immature Granulocytes: 8 %
Lymphocytes Relative: 77 %
Lymphs Abs: 1.9 10*3/uL (ref 0.7–4.0)
MCH: 23.7 pg — ABNORMAL LOW (ref 26.0–34.0)
MCHC: 29.3 g/dL — ABNORMAL LOW (ref 30.0–36.0)
MCV: 81 fL (ref 80.0–100.0)
Monocytes Absolute: 0.1 10*3/uL (ref 0.1–1.0)
Monocytes Relative: 3 %
Neutro Abs: 0.3 10*3/uL — ABNORMAL LOW (ref 1.7–7.7)
Neutrophils Relative %: 12 %
Platelets: 151 10*3/uL (ref 150–400)
RBC: 4.94 MIL/uL (ref 4.22–5.81)
RDW: 24.9 % — ABNORMAL HIGH (ref 11.5–15.5)
WBC: 2.5 10*3/uL — ABNORMAL LOW (ref 4.0–10.5)
nRBC: 0.8 % — ABNORMAL HIGH (ref 0.0–0.2)

## 2019-07-26 LAB — GLUCOSE, CAPILLARY
Glucose-Capillary: 179 mg/dL — ABNORMAL HIGH (ref 70–99)
Glucose-Capillary: 242 mg/dL — ABNORMAL HIGH (ref 70–99)

## 2019-07-26 LAB — BASIC METABOLIC PANEL
Anion gap: 14 (ref 5–15)
BUN: 21 mg/dL (ref 8–23)
CO2: 22 mmol/L (ref 22–32)
Calcium: 8.4 mg/dL — ABNORMAL LOW (ref 8.9–10.3)
Chloride: 100 mmol/L (ref 98–111)
Creatinine, Ser: 1.34 mg/dL — ABNORMAL HIGH (ref 0.61–1.24)
GFR calc Af Amer: 56 mL/min — ABNORMAL LOW (ref >60)
GFR calc non Af Amer: 49 mL/min — ABNORMAL LOW (ref >60)
Glucose, Bld: 204 mg/dL — ABNORMAL HIGH (ref 70–99)
Potassium: 3.6 mmol/L (ref 3.5–5.1)
Sodium: 136 mmol/L (ref 135–145)

## 2019-07-26 LAB — TROPONIN I (HIGH SENSITIVITY)
Troponin I (High Sensitivity): 25 ng/L — ABNORMAL HIGH (ref <18)
Troponin I (High Sensitivity): 122 ng/L (ref <18)

## 2019-07-26 LAB — RESPIRATORY PANEL BY RT PCR (FLU A&B, COVID)
Influenza A by PCR: NEGATIVE
Influenza B by PCR: NEGATIVE
SARS Coronavirus 2 by RT PCR: NEGATIVE

## 2019-07-26 LAB — HEMOGLOBIN A1C
Hgb A1c MFr Bld: 6.7 % — ABNORMAL HIGH (ref 4.8–5.6)
Mean Plasma Glucose: 145.59 mg/dL

## 2019-07-26 MED ORDER — METHYLPREDNISOLONE SODIUM SUCC 125 MG IJ SOLR: 60.0000 mg | Freq: Four times a day (QID) | INTRAMUSCULAR | Status: DC

## 2019-07-26 MED ORDER — INSULIN GLARGINE 100 UNIT/ML ~~LOC~~ SOLN
15.0000 [IU] | Freq: Two times a day (BID) | SUBCUTANEOUS | Status: DC
  Administered 2019-07-26 – 2019-07-28 (×4): 15 [IU] via SUBCUTANEOUS
  Filled 2019-07-26 (×6): qty 0.15

## 2019-07-26 MED ORDER — SODIUM CHLORIDE 0.9 % IV BOLUS
500.0000 mL | Freq: Once | INTRAVENOUS | Status: AC
  Administered 2019-07-26: 16:00:00 1000 mL via INTRAVENOUS

## 2019-07-26 MED ORDER — ONDANSETRON HCL 4 MG/2ML IJ SOLN
4.0000 mg | Freq: Once | INTRAMUSCULAR | Status: AC
  Administered 2019-07-26: 4 mg via INTRAVENOUS
  Filled 2019-07-26: qty 2

## 2019-07-26 MED ORDER — INSULIN ASPART 100 UNIT/ML ~~LOC~~ SOLN
0.0000 [IU] | Freq: Three times a day (TID) | SUBCUTANEOUS | Status: DC
  Administered 2019-07-27: 5 [IU] via SUBCUTANEOUS
  Administered 2019-07-27: 8 [IU] via SUBCUTANEOUS
  Administered 2019-07-27: 5 [IU] via SUBCUTANEOUS

## 2019-07-26 MED ORDER — SODIUM CHLORIDE 0.9 % IV SOLN
510.0000 mg | Freq: Once | INTRAVENOUS | Status: AC
  Administered 2019-07-26: 510 mg via INTRAVENOUS
  Filled 2019-07-26: qty 510

## 2019-07-26 MED ORDER — METHYLPREDNISOLONE SODIUM SUCC 125 MG IJ SOLR
125.0000 mg | Freq: Once | INTRAMUSCULAR | Status: AC
  Administered 2019-07-26: 125 mg via INTRAVENOUS
  Filled 2019-07-26: qty 2

## 2019-07-26 MED ORDER — FAMOTIDINE IN NACL 20-0.9 MG/50ML-% IV SOLN: 20.0000 mg | Freq: Two times a day (BID) | INTRAVENOUS | Status: DC

## 2019-07-26 MED ORDER — ORAL CARE MOUTH RINSE
15.0000 mL | Freq: Two times a day (BID) | OROMUCOSAL | Status: DC
  Administered 2019-07-27 – 2019-07-28 (×3): 15 mL via OROMUCOSAL

## 2019-07-26 MED ORDER — SODIUM CHLORIDE 0.9 % IV SOLN: Freq: Once | INTRAVENOUS | Status: AC

## 2019-07-26 MED ORDER — PANTOPRAZOLE SODIUM 40 MG PO TBEC
40.0000 mg | DELAYED_RELEASE_TABLET | Freq: Two times a day (BID) | ORAL | Status: DC
  Administered 2019-07-26 – 2019-07-28 (×4): 40 mg via ORAL
  Filled 2019-07-26 (×4): qty 1

## 2019-07-26 MED ORDER — EPINEPHRINE 0.3 MG/0.3ML IJ SOAJ
0.3000 mg | Freq: Once | INTRAMUSCULAR | Status: AC
  Administered 2019-07-26: 0.3 mg via INTRAMUSCULAR
  Filled 2019-07-26: qty 0.3

## 2019-07-26 MED ORDER — EPINEPHRINE 0.3 MG/0.3ML IJ SOAJ
0.3000 mg | Freq: Once | INTRAMUSCULAR | Status: DC
  Administered 2019-07-26: 14:00:00 0.3 mg via SUBCUTANEOUS

## 2019-07-26 MED ORDER — ACETAMINOPHEN 500 MG PO TABS
500.0000 mg | ORAL_TABLET | Freq: Four times a day (QID) | ORAL | Status: DC | PRN
  Administered 2019-07-27: 500 mg via ORAL
  Filled 2019-07-26: qty 1

## 2019-07-26 MED ORDER — VITAMIN D 25 MCG (1000 UNIT) PO TABS
2000.0000 [IU] | ORAL_TABLET | Freq: Every day | ORAL | Status: DC
  Administered 2019-07-27 – 2019-07-28 (×2): 2000 [IU] via ORAL
  Filled 2019-07-26 (×2): qty 2

## 2019-07-26 MED ORDER — DIPHENHYDRAMINE HCL 50 MG/ML IJ SOLN
12.5000 mg | Freq: Four times a day (QID) | INTRAMUSCULAR | Status: DC
  Administered 2019-07-26 – 2019-07-27 (×2): 12.5 mg via INTRAVENOUS
  Filled 2019-07-26 (×2): qty 1

## 2019-07-26 MED ORDER — FOLIC ACID 1 MG PO TABS
1.0000 mg | ORAL_TABLET | Freq: Every day | ORAL | Status: DC
  Administered 2019-07-27 – 2019-07-28 (×2): 1 mg via ORAL
  Filled 2019-07-26 (×2): qty 1

## 2019-07-26 MED ORDER — ONDANSETRON HCL 4 MG/2ML IJ SOLN: 4.0000 mg | Freq: Four times a day (QID) | INTRAMUSCULAR | Status: DC | PRN

## 2019-07-26 MED ORDER — FAMOTIDINE IN NACL 20-0.9 MG/50ML-% IV SOLN
20.0000 mg | INTRAVENOUS | Status: DC
  Filled 2019-07-26: qty 50

## 2019-07-26 MED ORDER — DIPHENHYDRAMINE HCL 50 MG/ML IJ SOLN
25.0000 mg | Freq: Once | INTRAMUSCULAR | Status: AC
  Administered 2019-07-26: 25 mg via INTRAVENOUS
  Filled 2019-07-26: qty 1

## 2019-07-26 MED ORDER — SODIUM CHLORIDE 0.9 % IV BOLUS: 1000.0000 mL | Freq: Once | INTRAVENOUS | Status: DC

## 2019-07-26 MED ORDER — EPINEPHRINE PF 1 MG/ML IJ SOLN
0.3000 mg | Freq: Once | INTRAMUSCULAR | Status: AC
  Administered 2019-07-26: 0.3 mg via SUBCUTANEOUS

## 2019-07-26 MED ORDER — METHYLPREDNISOLONE SODIUM SUCC 125 MG IJ SOLR
80.0000 mg | Freq: Four times a day (QID) | INTRAMUSCULAR | Status: DC
  Administered 2019-07-26 – 2019-07-27 (×3): 80 mg via INTRAVENOUS
  Filled 2019-07-26 (×3): qty 2

## 2019-07-26 MED ORDER — ONDANSETRON HCL 4 MG PO TABS: 4.0000 mg | ORAL_TABLET | Freq: Four times a day (QID) | ORAL | Status: DC | PRN

## 2019-07-26 MED ORDER — SODIUM CHLORIDE 0.9 % IV SOLN: INTRAVENOUS | Status: AC

## 2019-07-26 MED ORDER — FAMOTIDINE IN NACL 20-0.9 MG/50ML-% IV SOLN
20.0000 mg | Freq: Once | INTRAVENOUS | Status: AC
  Administered 2019-07-26: 14:00:00 20 mg via INTRAVENOUS
  Filled 2019-07-26: qty 50

## 2019-07-26 NOTE — ED Notes (Signed)
Pt was taken off of NRB and was placed on Nasal Canula on 4L.  O2 Sats are 100%/

## 2019-07-26 NOTE — Progress Notes (Signed)
Rapid Response Event Note  Overview:  Responded to code blue activation in infusion clinic. Pt there to receive IV feraheme. Per Primary infusion RN, pt had just started infusion when he started having sudden chest pain.      Initial Focused Assessment: Pt found in room to be pale, diaphoretic, SOB with tachypnea, hypoxic, and hypotensive. Excessive salivation and coughing up sputum.  Initial Vital signs as follows: BP 54/38, RR 45, HR 80. Oxygen saturations 80% on 4 L [place].  Pt c/o severe chest pain. PT is alert and able to converse with staff, but in acute distress.  Interventions: Primary RN immediately stopped IV Feraheme upon responding to patient call. Full vitals obtained, Pt placed on 15 Liter NRB mask for hypoxia. Pt placed on Zoll pads/monitor, EKG obtained, rapid infusion of normal saline started. ED provider to bedside for evaluation.  Plan of Care (if not transferred): Pt to ED for further evaluation and treatment. Staff in ED.   Jeanice Lim M

## 2019-07-26 NOTE — H&P (Signed)
TRH H&P   Patient Demographics:    Mike Wright, is a 86 y.o. male  MRN: [id number]   DOB - 01-17-1934  Admit Date - 07/26/2019  Outpatient Primary MD for the patient is Monico Blitz, MD  Referring MD/NP/PA: Dr Melina Copa  Patient coming from: Home>> infusion center.  Chief Complaint  Patient presents with  . Allergic Reaction      HPI:    Mike Wright  is a 86 y.o. male,with medical history significant for CAD with prior stents, atrial fibrillation on Eliquis, GERD, type 2 diabetes, obstructive sleep apnea, COPD with chronic hypoxemia, chronic diastolic heart failure, and peripheral vascular disease , with recent hospitalization of September 2020 secondary to GI bleed and iron deficiency anemia, followed by Dr. Corbin Ade as an outpatient. -[facility] was called at infusion center, patient did not lose pulse, or blood pressure.  Apparently patient was receiving IV Feraheme transfusion, he was 5 minutes into his transfusion, where he started to complaints of chest pain, radiating to the back, diaphoresis, shortness of breath, and nausea, he was awake and alert through the entire event, infusion was stopped immediately, upon evaluation by ED physician responding to [place], patient had low blood pressure, but never lost his pulse, currently in ED patient still reports some chest pain radiating to the back, but this has significantly improved, his oxygen requirement significantly improved from 15 to 4 L, with history treating 100%, blood pressure remains on the lower side with systolic hovering between 70s and 90s, but he is awake alert and appropriate and coherent, during that event patient did receive epinephrine x2, initially subcutaneous, then IM, as well he did receive IV steroids, IV Benadryl and IV Pepcid, treated hospitalist were consulted to admit, as well PCCM were consulted while patient  is in ED.    Review of systems:    In addition to the HPI above,  No Fever-chills, poor generalized weakness, fatigue No Headache, No changes with Vision or hearing, No problems swallowing food or Liquids, Complains of right-sided chest pain, and left upper side back pain as well, reports some dyspnea, no cough no Abdominal pain, No Nausea or Vommitting, Bowel movements are regular, No Blood in stool or Urine, No dysuria, No new skin rashes or bruises, No new joints pains-aches,  No new weakness, tingling, numbness in any extremity, No recent weight gain or loss, No polyuria, polydypsia or polyphagia, No significant Mental Stressors.  A full 10 point Review of Systems was done, except as stated above, all other Review of Systems were negative.   With Past History of the following :    Past Medical History:  Diagnosis Date  . Anemia    a. mild, noted 04/2017.  [name] CAD in native artery    a. [name] 04/2017 s/p DES to D1, DES to prox-mid LAD, DES to prox LAD overlapping the prior stent, LVEF 55-65%.   [name]  Chronic diastolic CHF (congestive heart failure) ([place])   . Constipation   . COPD (chronic obstructive pulmonary disease) ([place])   . Diabetic ulcer of toe ([place])   . DJD (degenerative joint disease) of cervical spine   . Dysrhythmia    AFib  . Essential hypertension   . GERD (gastroesophageal reflux disease)   . History of hiatal hernia   . HIT (heparin-induced thrombocytopenia) ([place])   . Hypothyroidism   . Hypoxia    a. went home on home O2 04/2017.  [name] Insomnia   . Mixed hyperlipidemia   . PAD (peripheral artery disease) ([place])   . PAF (paroxysmal atrial fibrillation) ([place])   . PVD (peripheral vascular disease) ([place])   . Renal insufficiency   . 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  . Sinus bradycardia    a. HR 30s-40s in 04/2017 -> diltiazem stopped, metoprolol reduced.  . Sleep apnea    "chose not to order CPAP at this time" (05/18/2017)  . Type 2 diabetes mellitus  ([place])   . Wheezing    a. suspected COPD 04/2017. Former tobacco x 40 years.      Past Surgical History:  Procedure Laterality Date  . ABDOMINAL AORTOGRAM W/LOWER EXTREMITY N/A 09/15/2017   Procedure: ABDOMINAL AORTOGRAM W/LOWER EXTREMITY;  Surgeon: Serafina Mitchell, MD;  Location: [place] [facility];  Service: Cardiovascular;  Laterality: N/A;  . AMPUTATION Bilateral 09/20/2017   Procedure: BILATERAL GREAT TOE AMPUTATIONS INCLUDING METATARSAL HEADS;  Surgeon: Serafina Mitchell, MD;  [facility];  Service: Vascular;  Laterality: Bilateral;  . AMPUTATION Right 03/01/2019   Procedure: AMPUTATION TOES;  Surgeon: Angelia Mould, MD;  Location: [place];  Service: Vascular;  Laterality: Right;  . AMPUTATION Right 05/29/2019   Procedure: AMPUTATION RIGHT THIRD TOE;  Surgeon: Serafina Mitchell, MD;  Location: [place];  Service: Vascular;  Laterality: Right;  . ANGIOPLASTY Right 09/20/2017   Procedure: ANGIOPLASTY RIGHT TIBIAL ARTERY;  Surgeon: Serafina Mitchell, MD;  Location: [place];  Service: Vascular;  Laterality: Right;  . APPENDECTOMY    . BACK SURGERY    . CARDIAC CATHETERIZATION  1980s; 2012;  . CATARACT EXTRACTION Left 2004  . COLONOSCOPY  2004   FLEISHMAN TICS  . COLONOSCOPY WITH PROPOFOL N/A 11/28/2018   Procedure: COLONOSCOPY WITH PROPOFOL;  Surgeon: Rogene Houston, MD;  [facility];  Service: Endoscopy;  Laterality: N/A;  . CORONARY ANGIOPLASTY WITH STENT PLACEMENT  05/18/2017   "3 stents"  . CORONARY STENT INTERVENTION N/A 05/18/2017   Procedure: CORONARY STENT INTERVENTION;  Surgeon: Jettie Booze, MD;  Location: [place] [facility];  Service: Cardiovascular;  Laterality: N/A;  . ESOPHAGOGASTRODUODENOSCOPY (EGD) WITH PROPOFOL N/A 11/20/2017   Procedure: ESOPHAGOGASTRODUODENOSCOPY (EGD) WITH PROPOFOL;  Surgeon: Irene Shipper, MD;  Location: [place];  Service: Gastroenterology;  Laterality: N/A;  . ESOPHAGOGASTRODUODENOSCOPY (EGD) WITH PROPOFOL N/A 11/28/2018    Procedure: ESOPHAGOGASTRODUODENOSCOPY (EGD) WITH PROPOFOL;  Surgeon: Rogene Houston, MD;  [facility];  Service: Endoscopy;  Laterality: N/A;  . ESOPHAGOGASTRODUODENOSCOPY (EGD) WITH PROPOFOL N/A 07/12/2019   Procedure: ESOPHAGOGASTRODUODENOSCOPY (EGD) WITH PROPOFOL;  Surgeon: Rogene Houston, MD;  [facility];  Service: Endoscopy;  Laterality: N/A;  125  . GIVENS CAPSULE STUDY N/A 12/17/2018   Procedure: GIVENS CAPSULE STUDY;  Surgeon: Rogene Houston, MD;  [facility];  Service: Endoscopy;  Laterality: N/A;  7:30am  . I & D EXTREMITY Right 12/17/2017  Procedure: IRRIGATION AND DEBRIDEMENT RIGHT GREAT TOE;  Surgeon: Serafina Mitchell, MD;  [facility];  Service: Vascular;  Laterality: Right;  . JOINT REPLACEMENT    . LEFT HEART CATH AND CORONARY ANGIOGRAPHY N/A 05/18/2017   Procedure: LEFT HEART CATH AND CORONARY ANGIOGRAPHY;  Surgeon: Jettie Booze, MD;  Location: [place] [facility];  Service: Cardiovascular;  Laterality: N/A;  . LOWER EXTREMITY ANGIOGRAM Right 09/20/2017   Procedure: RIGHT LOWER LEG  ANGIOGRAM;  Surgeon: Serafina Mitchell, MD;  Location: [place];  Service: Vascular;  Laterality: Right;  . LUMBAR FUSION  2002   L3, 4 L4, 5 L5 S1 Fused by Dr. Glenna Fellows  . PERIPHERAL VASCULAR BALLOON ANGIOPLASTY Left 09/15/2017   Procedure: PERIPHERAL VASCULAR BALLOON ANGIOPLASTY;  Surgeon: Serafina Mitchell, MD;  Location: [place] [facility];  Service: Cardiovascular;  Laterality: Left;  PTA of Peroneal & Posterior Tibial  . POLYPECTOMY  11/28/2018   Procedure: POLYPECTOMY;  Surgeon: Rogene Houston, MD;  [facility];  Service: Endoscopy;;  duodenum  . POSTERIOR LUMBAR FUSION    . RHINOPLASTY    . RIGHT HEART CATH N/A 05/18/2017   Procedure: RIGHT HEART CATH;  Surgeon: Jettie Booze, MD;  Location: [place] [facility];  Service: Cardiovascular;  Laterality: N/A;  . TEE WITHOUT CARDIOVERSION N/A 09/18/2017   Procedure: TRANSESOPHAGEAL  ECHOCARDIOGRAM (TEE);  Surgeon: Fay Records, MD;  Location: [place];  Service: Cardiovascular;  Laterality: N/A;  . TOTAL KNEE ARTHROPLASTY Bilateral   . TRANSURETHRAL RESECTION OF PROSTATE  2001   [place]      Social History:     Social History   Tobacco Use  . Smoking status: Former Smoker    Packs/day: 2.00    Years: 30.00    Pack years: 60.00    Types: Cigarettes    Start date: 03/28/1953    Quit date: 03/29/1983    Years since quitting: 36.3  . Smokeless tobacco: Never Used  Substance Use Topics  . Alcohol use: Not Currently    Comment: 05/18/2017 'I drink a beer q yr"        Family History :     Family History  Problem Relation Age of Onset  . Heart attack Father 79  . COPD Father   . COPD Mother   . Heart disease Mother   . Diabetes Mother   . Hypertension Sister   . CVA Sister   . Diabetes Sister   . Multiple sclerosis Sister       Home Medications:   Prior to Admission medications   Medication Sig Start Date End Date Taking? Authorizing Provider  acetaminophen (TYLENOL) 500 MG tablet Take 1 tablet (500 mg total) by mouth every 6 (six) hours as needed for mild pain or headache. 03/03/19   Allie Bossier, MD  atorvastatin (LIPITOR) 80 MG tablet Take 80 mg by mouth every evening.  05/23/19   [provider]  Cholecalciferol (VITAMIN D) 50 MCG (2000 UT) tablet Take 2,000 Units by mouth daily.    [provider]  clopidogrel (PLAVIX) 75 MG tablet Take 75 mg by mouth daily.    [provider]  Cyanocobalamin (B-12) 2500 MCG TABS Take 2,500 mcg by mouth daily.    [provider]  ELIQUIS 5 MG TABS tablet TAKE 1 TABLET TWICE DAILY Patient taking differently: Take 5 mg by mouth 2 (two) times daily.  02/25/19   Arnoldo Lenis, MD  folic acid Darnelle Catalan)  1 MG tablet Take 1 tablet (1 mg total) by mouth daily. 12/06/17   Oswald Hillock, MD  furosemide (LASIX) 40 MG tablet Take 40 mg by mouth 2 (two) times daily.     [provider]  insulin glargine (LANTUS) 100 UNIT/ML injection Inject 30-40 Units into the skin See admin instructions. Inject 30 units SQ in the morning and inject 40 units SQ at bedtime    [provider]  isosorbide mononitrate (IMDUR) 30 MG 24 hr tablet TAKE 1/2 TABLET EVERY DAY Patient taking differently: Take 15 mg by mouth daily.  04/22/19   Arnoldo Lenis, MD  lisinopril (ZESTRIL) 5 MG tablet Take 0.5 tablets (2.5 mg total) by mouth daily. Patient taking differently: Take 5 mg by mouth daily.  11/29/18   Barton Dubois, MD  meclizine (ANTIVERT) 25 MG tablet Take 25 mg by mouth daily as needed for dizziness.    [provider]  metFORMIN (GLUCOPHAGE) 1000 MG tablet Take 1,000 mg by mouth 2 (two) times daily. 05/22/17   [provider]  nitroGLYCERIN (NITROSTAT) 0.4 MG SL tablet PLACE 1 TABLET (0.4 MG TOTAL) UNDER THE TONGUE EVERY 5  MINUTES AS NEEDED FOR CHEST PAIN. Patient taking differently: Place 0.4 mg under the tongue every 5 (five) minutes as needed for chest pain.  05/22/17   Arnoldo Lenis, MD  pantoprazole (PROTONIX) 40 MG tablet Take 1 tablet (40 mg total) by mouth 2 (two) times daily. To protect stomach while taking multiple blood thinners. 11/29/18   Barton Dubois, MD  Pediatric Multivitamins-Iron [facility] COMPLETE) 18 MG CHEW Chew 1 tablet by mouth 2 (two) times daily. Patient taking differently: Chew 1 tablet by mouth daily.  02/14/19   Rehman, Mechele Dawley, MD  potassium chloride SA (K-DUR) 20 MEQ tablet Take 1 tablet (20 mEq total) by mouth daily. 11/30/18   Barton Dubois, MD  psyllium (METAMUCIL) 58.6 % packet Take 0.5 packets by mouth 2 (two) times daily.     [provider]  traZODone (DESYREL) 100 MG tablet Take 100 mg by mouth at bedtime.  03/25/16   [provider]     Allergies:     Allergies  Allergen Reactions  . Bactrim [Sulfamethoxazole-Trimethoprim]     Pancytopenia  . Codeine Shortness Of Breath  . Heparin  Other (See Comments)    +HIT,  Severe bleeding (with heparin drip & large doses), tolerates low doses  . Losartan Swelling    Per ENT UNSPECIFIED REACTION   . Oxycodone Other (See Comments)    "Made me act out of my mind" Mental status changes hallucinations  . Feraheme [Ferumoxytol]     Diaphoretic, chest pain     Physical Exam:   Vitals  Blood pressure (!) 78/48, pulse 87, temperature 97.8 F (36.6 C), temperature source Oral, resp. rate (!) 30, height 5\' 7"  (1.702 m), weight 85 kg, SpO2 100 %.   1. General frail elderly male, sitting up in the bed, in mild distress, but able to speak full sentences.  2. Normal affect and insight, Not Suicidal or Homicidal, Awake Alert, Oriented X 3.  3. No F.N deficits, ALL C.Nerves Intact, Strength 5/5 all 4 extremities, Sensation intact all 4 extremities, Plantars down going.  4. Ears and Eyes appear Normal, Conjunctivae clear, PERRLA. Moist Oral Mucosa.  5. Supple Neck, No JVD, No cervical lymphadenopathy appriciated, No Carotid Bruits.  6. Symmetrical Chest wall movement, Good air movement bilaterally, CTAB.  7. RRR, No Gallops, Rubs or Murmurs,  No Parasternal Heave.  8. Positive Bowel Sounds, Abdomen Soft, No tenderness, No organomegaly appriciated,No rebound -guarding or rigidity.  9.  No Cyanosis, Normal Skin Turgor, No Skin Rash or Bruise.  10. Good muscle tone,  joints appear normal , no effusions, Normal ROM.  Right foot with 3 medial to amputated, left foot with 2 medial toes amputated.  11. No Palpable Lymph Nodes in Neck or Axillae     Data Review:    CBC Recent Labs  Lab 07/26/19 1412  WBC 2.5*  HGB 11.7*  HCT 40.0  PLT 151  MCV 81.0  MCH 23.7*  MCHC 29.3*  RDW 24.9*  LYMPHSABS 1.9  MONOABS 0.1  EOSABS 0.0  BASOSABS 0.0   ------------------------------------------------------------------------------------------------------------------  Chemistries  Recent Labs  Lab 07/26/19 1412  NA 136  K 3.6    CL 100  CO2 22  GLUCOSE 204*  BUN 21  CREATININE 1.34*  CALCIUM 8.4*   ------------------------------------------------------------------------------------------------------------------ estimated creatinine clearance is 43.5 mL/min (A) (by C-G formula based on SCr of 1.34 mg/dL (H)). ------------------------------------------------------------------------------------------------------------------ No results for input(s): TSH, T4TOTAL, T3FREE, THYROIDAB in the last 72 hours.  Invalid input(s): FREET3  Coagulation profile No results for input(s): INR, PROTIME in the last 168 hours. ------------------------------------------------------------------------------------------------------------------- No results for input(s): DDIMER in the last 72 hours. -------------------------------------------------------------------------------------------------------------------  Cardiac Enzymes No results for input(s): CKMB, TROPONINI, MYOGLOBIN in the last 168 hours.  Invalid input(s): CK ------------------------------------------------------------------------------------------------------------------    Component Value Date/Time   BNP 428.0 (H) 11/26/2018 1036     ---------------------------------------------------------------------------------------------------------------  Urinalysis    Component Value Date/Time   COLORURINE YELLOW 12/13/2017 2043   APPEARANCEUR CLEAR 12/13/2017 2043   LABSPEC 1.014 12/13/2017 2043   PHURINE 5.0 12/13/2017 2043   GLUCOSEU NEGATIVE 12/13/2017 2043   HGBUR NEGATIVE 12/13/2017 2043   [place] NEGATIVE 12/13/2017 2043   [place] NEGATIVE 12/13/2017 2043   PROTEINUR NEGATIVE 12/13/2017 2043   NITRITE NEGATIVE 12/13/2017 2043   LEUKOCYTESUR NEGATIVE 12/13/2017 2043    ----------------------------------------------------------------------------------------------------------------   Imaging Results:    DG Chest Port 1 View  Result Date:  07/26/2019 CLINICAL DATA:  Chest pain. EXAM: PORTABLE CHEST 1 VIEW COMPARISON:  February 28, 2019. FINDINGS: Stable cardiomediastinal silhouette. No pneumothorax is noted. Minimal bibasilar subsegmental atelectasis is noted with minimal pleural effusions. Bony thorax is unremarkable. IMPRESSION: Minimal bibasilar subsegmental atelectasis with minimal pleural effusions. Electronically Signed   By: Marijo Conception M.D.   On: 07/26/2019 14:16    My personal review of EKG: Rhythm NSR, Rate  97 /min, QTc445   Assessment & Plan:    Active Problems:   Diabetes ([place])   Essential hypertension   CAD S/P percutaneous coronary angioplasty   PAF (paroxysmal atrial fibrillation) ([facility])   COPD (chronic obstructive pulmonary disease) ([facility])   Chest pain   PAD (peripheral artery disease) ([facility])   Chronic respiratory failure with hypoxia ([facility])   Anaphylaxis   Altered mental status/Hypoxia/hypotension>> anaphylaxis -Patient with hypotension, hypoxia, encephalopathy and increased oral secretion, this is most likely due to anaphylaxis, even though sometimes IV Feraheme transfusion can cause some chest pain, back pain in a similar fashion, but it would not cause hypotension. -Patient received epinephrine x2, IV steroids, IV Pepcid and IV Benadryl, hypoxia has improved and blood pressure is improving as well. -He remains hypotensive, PCCM input greatly appreciated, for now we will continue with aggressive volume resuscitation given significant volume shift noted usually with anaphylaxis, will hold on pressor support. -Continue with IV Solu-Medrol, IV Benadryl and IV Pepcid,  with low threshold to discontinue 1 vital signs improved. -We will admit to ICU, keep n.p.o., hold his Eliquis and plavix  in case he will need any procedures.  Hypertension -Hold home  medication given low blood pressure  History of CAD -Complains of chest pain most likely due to IV iron transfusion reaction, as well can be contributed to low  blood pressure, EKG nonacute, first troponin is negative, will follow. -Hold Plavix for now in case he will need any procedures, and hold lisinopril and Imdur given hypotension.  PAD -With history of gangrene in bilateral lower toes, status post amputation, resume Plavix once stable.  Beatties mellitus -We will resume Lantus at a lower dose given he is n.p.o., will monitor CBG with sliding scale.  Paroxysmal atrial fibrillation -Rate is controlled, resume Eliquis once stable  Chronic systolic CHF -Echo in [id number] showing preserved EF, with moderate LVH, monitor volume that is closely as receiving IV fluids.  Iron deficiency anemia -Followed by GI as an outpatient, this is second IV iron transfusion.  DVT Prophylaxis : Will resume Eliquis in 24 hours  AM Labs Ordered, also please review Full Orders  Family Communication: Admission, patients condition and plan of care including tests being ordered have been discussed with the patient and Wife  who indicate understanding and agree with the plan and Code Status.  Code Status PARTIAL, no CPR or shocking, but okay with intubation  Likely DC to  : Home  Condition : Extremly GUARDED  Consults called:  PCCM  Admission status:  Inpatient  Time spent in minutes : 65 minutes   Phillips Climes M.D on 07/26/2019 at 4:07 PM  Between 7am to 7pm - Pager - [phone]. After 7pm go to [email or web address] - password [facility]  [facility] - Office  [phone]

## 2019-07-26 NOTE — ED Notes (Addendum)
Pt was decreased from 4L to 2L via [place]. Current O2 sats are 98% on 2L via [place].  Pt is in no respiratory distress.

## 2019-07-26 NOTE — ED Provider Notes (Signed)
[place] Provider Note   CSN: [id number] Arrival date & time: 07/26/19  1336     History No chief complaint on file.   Mike Wright is a 86 y.o. male.  He was here and the day surgery center receiving a Feraheme infusion.  He has had this medicine before.  Just a few minutes into the infusion he began experiencing chest pain diaphoresis shortness of breath nausea.  CODE BLUE was called and I responded to him from the emergency department.  His initial EKG did not show a STEMI.  The infusion was stopped he was given IV fluids and moved to the emergency department.  He is still complaining of pain in his chest although he is more complaining of feeling weak.  The history is provided by the patient, a caregiver and a significant other. The history is limited by the condition of the patient.  Allergic Reaction Presenting symptoms: difficulty breathing   Presenting symptoms: no rash   Severity:  Severe Prior allergic episodes:  No prior episodes Context: medications   Relieved by:  Nothing Worsened by:  Nothing Ineffective treatments:  None tried      Past Medical History:  Diagnosis Date  . Anemia    a. mild, noted 04/2017.  [name] CAD in native artery    a. [name] 04/2017 s/p DES to D1, DES to prox-mid LAD, DES to prox LAD overlapping the prior stent, LVEF 55-65%.   . Chronic diastolic CHF (congestive heart failure) ([place])   . Constipation   . COPD (chronic obstructive pulmonary disease) ([place])   . Diabetic ulcer of toe ([place])   . DJD (degenerative joint disease) of cervical spine   . Dysrhythmia    AFib  . Essential hypertension   . GERD (gastroesophageal reflux disease)   . History of hiatal hernia   . HIT (heparin-induced thrombocytopenia) ([place])   . Hypothyroidism   . Hypoxia    a. went home on home O2 04/2017.  [name] Insomnia   . Mixed hyperlipidemia   . PAD (peripheral artery disease) ([place])   . PAF (paroxysmal atrial fibrillation) ([place])   . PVD  (peripheral vascular disease) ([place])   . Renal insufficiency   . 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  . Sinus bradycardia    a. HR 30s-40s in 04/2017 -> diltiazem stopped, metoprolol reduced.  . Sleep apnea    "chose not to order CPAP at this time" (05/18/2017)  . Type 2 diabetes mellitus ([place])   . Wheezing    a. suspected COPD 04/2017. Former tobacco x 40 years.    Patient Active Problem List   Diagnosis Date Noted  . Gangrene of toe of right foot ([place]) 02/28/2019  . Gastrointestinal hemorrhage 12/10/2018  . Orthostatic hypotension   . Gastroesophageal reflux disease without esophagitis   . Chronic respiratory failure with hypoxia ([place])   . Bradycardia 11/26/2018  . Abdominal pain, epigastric 11/26/2018  . Acute blood loss anemia 11/26/2018  . PAD (peripheral artery disease) ([place]) 12/13/2017  . Non-healing surgical wound 12/13/2017  . Infected surgical wound 12/13/2017  . Paroxysmal atrial flutter ([place])   . Dyspnea 12/02/2017  . Pancytopenia ([place]) 12/02/2017  . Coronary artery disease involving native coronary artery of native heart without angina pectoris 11/20/2017  . Lower abdominal pain   . Nausea without vomiting   . Anemia 11/18/2017  . Volume overload 11/18/2017  . Chest pain 11/17/2017  . Gangrene ([place])   . Urinary retention  09/16/2017  . MRSA bacteremia 09/15/2017  . Gangrene of bilateral great toes ([place]) 09/13/2017  . COPD (chronic obstructive pulmonary disease) ([place]) 05/19/2017  . Iron deficiency anemia due to chronic blood loss   . CAD S/P percutaneous coronary angioplasty   . Acute on chronic diastolic CHF (congestive heart failure) ([place])   . PSVT (paroxysmal supraventricular tachycardia) ([place])   . PAF (paroxysmal atrial fibrillation) ([place])   . Sleep apnea   . Type 2 diabetes mellitus ([place])   . Status post coronary artery stent placement   . Essential hypertension 04/15/2010  . Diabetes ([place]) 04/14/2010    Past Surgical History:  Procedure  Laterality Date  . ABDOMINAL AORTOGRAM W/LOWER EXTREMITY N/A 09/15/2017   Procedure: ABDOMINAL AORTOGRAM W/LOWER EXTREMITY;  Surgeon: Serafina Mitchell, MD;  Location: [facility];  Service: Cardiovascular;  Laterality: N/A;  . AMPUTATION Bilateral 09/20/2017   Procedure: BILATERAL GREAT TOE AMPUTATIONS INCLUDING METATARSAL HEADS;  Surgeon: Serafina Mitchell, MD;  [facility];  Service: Vascular;  Laterality: Bilateral;  . AMPUTATION Right 03/01/2019   Procedure: AMPUTATION TOES;  Surgeon: Angelia Mould, MD;  Location: [facility];  Service: Vascular;  Laterality: Right;  . AMPUTATION Right 05/29/2019   Procedure: AMPUTATION RIGHT THIRD TOE;  Surgeon: Serafina Mitchell, MD;  Location: [facility];  Service: Vascular;  Laterality: Right;  . ANGIOPLASTY Right 09/20/2017   Procedure: ANGIOPLASTY RIGHT TIBIAL ARTERY;  Surgeon: Serafina Mitchell, MD;  Location: [facility];  Service: Vascular;  Laterality: Right;  . APPENDECTOMY    . BACK SURGERY    . CARDIAC CATHETERIZATION  1980s; 2012;  . CATARACT EXTRACTION Left 2004  . COLONOSCOPY  2004   FLEISHMAN TICS  . COLONOSCOPY WITH PROPOFOL N/A 11/28/2018   Procedure: COLONOSCOPY WITH PROPOFOL;  Surgeon: Rogene Houston, MD;  [facility];  Service: Endoscopy;  Laterality: N/A;  . CORONARY ANGIOPLASTY WITH STENT PLACEMENT  05/18/2017   "3 stents"  . CORONARY STENT INTERVENTION N/A 05/18/2017   Procedure: CORONARY STENT INTERVENTION;  Surgeon: Jettie Booze, MD;  Location: [facility];  Service: Cardiovascular;  Laterality: N/A;  . ESOPHAGOGASTRODUODENOSCOPY (EGD) WITH PROPOFOL N/A 11/20/2017   Procedure: ESOPHAGOGASTRODUODENOSCOPY (EGD) WITH PROPOFOL;  Surgeon: Irene Shipper, MD;  Location: [facility];  Service: Gastroenterology;  Laterality: N/A;  . ESOPHAGOGASTRODUODENOSCOPY (EGD) WITH PROPOFOL N/A 11/28/2018   Procedure: ESOPHAGOGASTRODUODENOSCOPY (EGD) WITH PROPOFOL;  Surgeon: Rogene Houston, MD;  [facility];   Service: Endoscopy;  Laterality: N/A;  . ESOPHAGOGASTRODUODENOSCOPY (EGD) WITH PROPOFOL N/A 07/12/2019   Procedure: ESOPHAGOGASTRODUODENOSCOPY (EGD) WITH PROPOFOL;  Surgeon: Rogene Houston, MD;  [facility];  Service: Endoscopy;  Laterality: N/A;  125  . GIVENS CAPSULE STUDY N/A 12/17/2018   Procedure: GIVENS CAPSULE STUDY;  Surgeon: Rogene Houston, MD;  [facility];  Service: Endoscopy;  Laterality: N/A;  7:30am  . I & D EXTREMITY Right 12/17/2017   Procedure: IRRIGATION AND DEBRIDEMENT RIGHT GREAT TOE;  Surgeon: Serafina Mitchell, MD;  [facility];  Service: Vascular;  Laterality: Right;  . JOINT REPLACEMENT    . LEFT HEART CATH AND CORONARY ANGIOGRAPHY N/A 05/18/2017   Procedure: LEFT HEART CATH AND CORONARY ANGIOGRAPHY;  Surgeon: Jettie Booze, MD;  Location: [facility];  Service: Cardiovascular;  Laterality: N/A;  . LOWER EXTREMITY ANGIOGRAM Right 09/20/2017   Procedure: RIGHT LOWER LEG  ANGIOGRAM;  Surgeon: Serafina Mitchell, MD;  Location: [facility];  Service:  Vascular;  Laterality: Right;  . LUMBAR FUSION  2002   L3, 4 L4, 5 L5 S1 Fused by Dr. Glenna Fellows  . PERIPHERAL VASCULAR BALLOON ANGIOPLASTY Left 09/15/2017   Procedure: PERIPHERAL VASCULAR BALLOON ANGIOPLASTY;  Surgeon: Serafina Mitchell, MD;  Location: [facility];  Service: Cardiovascular;  Laterality: Left;  PTA of Peroneal & Posterior Tibial  . POLYPECTOMY  11/28/2018   Procedure: POLYPECTOMY;  Surgeon: Rogene Houston, MD;  [facility];  Service: Endoscopy;;  duodenum  . POSTERIOR LUMBAR FUSION    . RHINOPLASTY    . RIGHT HEART CATH N/A 05/18/2017   Procedure: RIGHT HEART CATH;  Surgeon: Jettie Booze, MD;  Location: [facility];  Service: Cardiovascular;  Laterality: N/A;  . TEE WITHOUT CARDIOVERSION N/A 09/18/2017   Procedure: TRANSESOPHAGEAL ECHOCARDIOGRAM (TEE);  Surgeon: Fay Records, MD;  Location: [facility];  Service: Cardiovascular;  Laterality: N/A;    . TOTAL KNEE ARTHROPLASTY Bilateral   . TRANSURETHRAL RESECTION OF PROSTATE  2001   [facility]       Family History  Problem Relation Age of Onset  . Heart attack Father 73  . COPD Father   . COPD Mother   . Heart disease Mother   . Diabetes Mother   . Hypertension Sister   . CVA Sister   . Diabetes Sister   . Multiple sclerosis Sister     Social History   Tobacco Use  . Smoking status: Former Smoker    Packs/day: 2.00    Years: 30.00    Pack years: 60.00    Types: Cigarettes    Start date: 03/28/1953    Quit date: 03/29/1983    Years since quitting: 36.3  . Smokeless tobacco: Never Used  Substance Use Topics  . Alcohol use: Not Currently    Comment: 05/18/2017 'I drink a beer q yr"  . Drug use: No    Home Medications Prior to Admission medications   Medication Sig Start Date End Date Taking? Authorizing Provider  acetaminophen (TYLENOL) 500 MG tablet Take 1 tablet (500 mg total) by mouth every 6 (six) hours as needed for mild pain or headache. 03/03/19   Allie Bossier, MD  atorvastatin (LIPITOR) 80 MG tablet Take 80 mg by mouth every evening.  05/23/19   [provider]  Cholecalciferol (VITAMIN D) 50 MCG (2000 UT) tablet Take 2,000 Units by mouth daily.    [provider]  clopidogrel (PLAVIX) 75 MG tablet Take 75 mg by mouth daily.    [provider]  Cyanocobalamin (B-12) 2500 MCG TABS Take 2,500 mcg by mouth daily.    [provider]  ELIQUIS 5 MG TABS tablet TAKE 1 TABLET TWICE DAILY Patient taking differently: Take 5 mg by mouth 2 (two) times daily.  02/25/19   Arnoldo Lenis, MD  folic acid (FOLVITE) 1 MG tablet Take 1 tablet (1 mg total) by mouth daily. 12/06/17   Oswald Hillock, MD  furosemide (LASIX) 40 MG tablet Take 40 mg by mouth 2 (two) times daily.     [provider]  insulin glargine (LANTUS) 100 UNIT/ML injection Inject 30-40 Units into the skin See admin instructions. Inject 30 units SQ in the morning and  inject 40 units SQ at bedtime    [provider]  isosorbide mononitrate (IMDUR) 30 MG 24 hr tablet TAKE 1/2 TABLET EVERY DAY Patient taking differently: Take 15 mg by mouth daily.  04/22/19   Carlyle Dolly  F, MD  lisinopril (ZESTRIL) 5 MG tablet Take 0.5 tablets (2.5 mg total) by mouth daily. Patient taking differently: Take 5 mg by mouth daily.  11/29/18   Barton Dubois, MD  meclizine (ANTIVERT) 25 MG tablet Take 25 mg by mouth daily as needed for dizziness.    [provider]  metFORMIN (GLUCOPHAGE) 1000 MG tablet Take 1,000 mg by mouth 2 (two) times daily. 05/22/17   [provider]  nitroGLYCERIN (NITROSTAT) 0.4 MG SL tablet PLACE 1 TABLET (0.4 MG TOTAL) UNDER THE TONGUE EVERY 5  MINUTES AS NEEDED FOR CHEST PAIN. Patient taking differently: Place 0.4 mg under the tongue every 5 (five) minutes as needed for chest pain.  05/22/17   Arnoldo Lenis, MD  pantoprazole (PROTONIX) 40 MG tablet Take 1 tablet (40 mg total) by mouth 2 (two) times daily. To protect stomach while taking multiple blood thinners. 11/29/18   Barton Dubois, MD  Pediatric Multivitamins-Iron [facility] COMPLETE) 18 MG CHEW Chew 1 tablet by mouth 2 (two) times daily. Patient taking differently: Chew 1 tablet by mouth daily.  02/14/19   Rehman, Mechele Dawley, MD  potassium chloride SA (K-DUR) 20 MEQ tablet Take 1 tablet (20 mEq total) by mouth daily. 11/30/18   Barton Dubois, MD  psyllium (METAMUCIL) 58.6 % packet Take 0.5 packets by mouth 2 (two) times daily.     [provider]  traZODone (DESYREL) 100 MG tablet Take 100 mg by mouth at bedtime.  03/25/16   [provider]    Allergies    Bactrim [sulfamethoxazole-trimethoprim], Codeine, Heparin, Losartan, and Oxycodone  Review of Systems   Review of Systems  Constitutional: Positive for diaphoresis. Negative for fever.  HENT: Negative for sore throat.   Eyes: Negative for visual disturbance.  Respiratory: Positive for shortness  of breath.   Cardiovascular: Positive for chest pain.  Gastrointestinal: Negative for abdominal pain.  Genitourinary: Negative for dysuria.  Musculoskeletal: Negative for neck pain.  Skin: Negative for rash.  Neurological: Positive for weakness.    Physical Exam Updated Vital Signs BP (!) 128/97   Pulse 66   Temp 97.8 F (36.6 C) (Oral)   Resp 19   Ht 5\' 7"  (1.702 m)   Wt 85 kg   SpO2 (P) 97%   BMI 29.35 kg/m   Physical Exam Vitals and nursing note reviewed.  Constitutional:      General: He is in acute distress.     Appearance: He is well-developed. He is diaphoretic.  HENT:     Head: Normocephalic and atraumatic.  Eyes:     Conjunctiva/sclera: Conjunctivae normal.  Cardiovascular:     Rate and Rhythm: Normal rate. Rhythm irregular.     Heart sounds: No murmur.  Pulmonary:     Effort: Tachypnea and accessory muscle usage present. No respiratory distress.     Breath sounds: Normal breath sounds.  Abdominal:     Palpations: Abdomen is soft.     Tenderness: There is no abdominal tenderness.  Musculoskeletal:        General: No deformity or signs of injury. Normal range of motion.     Cervical back: Neck supple.  Skin:    Capillary Refill: Capillary refill takes less than 2 seconds.     Coloration: Skin is pale.  Neurological:     General: No focal deficit present.     Mental Status: He is alert.     Sensory: No sensory deficit.     Motor: No weakness.  ED Results / Procedures / Treatments   Labs (all labs ordered are listed, but only abnormal results are displayed) Labs Reviewed  CBC WITH DIFFERENTIAL/PLATELET - Abnormal; Notable for the following components:      Result Value   WBC 2.5 (*)    Hemoglobin 11.7 (*)    MCH 23.7 (*)    MCHC 29.3 (*)    RDW 24.9 (*)    nRBC 0.8 (*)    Neutro Abs 0.3 (*)    Abs Immature Granulocytes 0.20 (*)    All other components within normal limits  BASIC METABOLIC PANEL - Abnormal; Notable for the following  components:   Glucose, Bld 204 (*)    Creatinine, Ser 1.34 (*)    Calcium 8.4 (*)    GFR calc non Af Amer 49 (*)    GFR calc Af Amer 56 (*)    All other components within normal limits  TROPONIN I (HIGH SENSITIVITY) - Abnormal; Notable for the following components:   Troponin I (High Sensitivity) 25 (*)    All other components within normal limits  TROPONIN I (HIGH SENSITIVITY) - Abnormal; Notable for the following components:   Troponin I (High Sensitivity) 122 (*)    All other components within normal limits  RESPIRATORY PANEL BY RT PCR (FLU A&B, COVID)  MRSA PCR SCREENING  COMPREHENSIVE METABOLIC PANEL  CBC  HEMOGLOBIN A1C    EKG None  Radiology DG Chest Port 1 View  Result Date: 07/26/2019 CLINICAL DATA:  Chest pain. EXAM: PORTABLE CHEST 1 VIEW COMPARISON:  February 28, 2019. FINDINGS: Stable cardiomediastinal silhouette. No pneumothorax is noted. Minimal bibasilar subsegmental atelectasis is noted with minimal pleural effusions. Bony thorax is unremarkable. IMPRESSION: Minimal bibasilar subsegmental atelectasis with minimal pleural effusions. Electronically Signed   By: Marijo Conception M.D.   On: 07/26/2019 14:16    Procedures .Critical Care Performed by: Hayden Rasmussen, MD Authorized by: Hayden Rasmussen, MD   Critical care provider statement:    Critical care time (minutes):  80   Critical care time was exclusive of:  Separately billable procedures and treating other patients   Critical care was necessary to treat or prevent imminent or life-threatening deterioration of the following conditions:  Circulatory failure and shock   Critical care was time spent personally by me on the following activities:  Discussions with consultants, evaluation of patient's response to treatment, examination of patient, ordering and performing treatments and interventions, ordering and review of laboratory studies, ordering and review of radiographic studies, pulse oximetry,  re-evaluation of patient's condition, obtaining history from patient or surrogate, review of old charts and development of treatment plan with patient or surrogate   I assumed direction of critical care for this patient from another provider in my specialty: no     (including critical care time)  Medications Ordered in ED Medications  diphenhydrAMINE (BENADRYL) injection 25 mg (25 mg Intravenous Given 07/26/19 1346)  ondansetron (ZOFRAN) injection 4 mg (4 mg Intravenous Given 07/26/19 1351)  methylPREDNISolone sodium succinate (SOLU-MEDROL) 125 mg/2 mL injection 125 mg (125 mg Intravenous Given 07/26/19 1346)  famotidine (PEPCID) IVPB 20 mg premix (0 mg Intravenous Stopped 07/26/19 1415)  EPINEPHrine (ADRENALIN) 0.3 mg (0.3 mg Subcutaneous Given 07/26/19 1342)  EPINEPHrine (EPI-PEN) injection 0.3 mg (0.3 mg Intramuscular Given 07/26/19 1422)    ED Course  I have reviewed the triage vital signs and the nursing notes.  Pertinent labs & imaging results that were available during my care of the patient were  reviewed by me and considered in my medical decision making (see chart for details).  Clinical Course as of Jul 25 1508  Fri Jul 26, 2019  1350 Patient was brought over from surgical dye to the emergency department.  Diaphoretic tachypneic hypotensive.  Had a brief episode of some clonic activity lasted about 10 seconds with return of normal alertness.  Subcu epi Solu-Medrol Benadryl given.  Pepcid infusing.  Zofran ordered IV fluids continued   [MB]  1356 Chest pain continuing 5 out of 10.  Last blood pressure 81/40, less diaphoretic.  Still tachypneic.   [MB]  [id number] EKG showing likely A. fib with no acute ST-T's.   [MB]  1414 Patient starting to feel worse again.  I ordered another dose of epi   [MB]  [id number] Chest x-ray interpreted by me as rotation probable atelectasis base   [MB]    Clinical Course User Index [MB] Hayden Rasmussen, MD   MDM Rules/Calculators/A&P                       This patient complains of weakness shortness of breath chest pain hypotension; this involves an extensive number of treatment Options and is a complaint that carries with it a high risk of complications and Morbidity. The differential includes anaphylaxis, anaphylactoid reaction, sepsis, ACS, pneumonia, pneumothorax  I ordered, reviewed and interpreted labs, which included low white blood cell count 2.5 new for patient, low hemoglobin 11.7 but better than patient's baseline, platelets  Normal.  Elevated creatinine of 1.34 but baseline for patient.  Initial troponin of 25 Covid testing negative I ordered medication subcu and IM epinephrine, steroids, H1 and H2 blockers, IV fluids I ordered imaging studies which included chest x-ray and I independently    visualized and interpreted imaging which showed atelectasis no gross infiltrates Additional history obtained from wife and nurses who are attending patient during infusion Previous records obtained and reviewed in epic I consulted [facility] hospitalist Dr. Waldron Labs along with critical care and discussed lab and imaging findings  Critical Interventions: Recognition of anaphylaxis and treatment  After the interventions stated above, I reevaluated the patient and found still remains hypotensive and symptomatic although improved from initial.  Diaphoresis improved.  Oxygen requirement lessened.  Will need admission for further management   Final Clinical Impression(s) / ED Diagnoses Final diagnoses:  Anaphylaxis, initial encounter    Rx / DC Orders ED Discharge Orders    None       Hayden Rasmussen, MD 07/26/19 [phone]

## 2019-07-26 NOTE — ED Notes (Signed)
Dr Elsworth Soho in, verbal order given for NS 500 ml bolus to given now and he will place more orders shortly.

## 2019-07-26 NOTE — ED Triage Notes (Signed)
Pt from outpt clinic, pt was receiving feraheme and at initiation pt became diaphoretic, nauseated, and complaining of chest pain. Code Blue was called. EDP and this RN responded. Pt alert, dyspneic, diaphoretic with non-rebreather. Pt assessed and brought over to ED for further treatment. Pt family brought over to ED as well.

## 2019-07-26 NOTE — ED Notes (Signed)
Hospitalitis is at bedside

## 2019-07-26 NOTE — ED Notes (Signed)
Prior to bolus maintainence 10 mL/hr NS was held.

## 2019-07-26 NOTE — Progress Notes (Signed)
Patient arrived for second Feraheme infusion.  Tolerated first infusion given after endoscopy on 07/12/19.  5 minutes into infusion, patient called me to room c/o 10/10 mid sternal chest pain, radiating into back associated with nausea and vomiting.  Iron stopped and 0.9 NS started wide open.  Patient pale and diaphoretic, although alert and oriented throughout event.  Code Button pulled for assistance.  Patient placed on defib pads, O2 via [place] then transitioned to NRB @ 15L, 12 lead EKG completed and  BS checked and was 172.  Patient stabilized and transferred to stretcher and taken to ED for further evaluation, accompanied by ED physician and nurse on monitor. Wife in waiting area and was made aware.  Glasses given to her at that time.

## 2019-07-26 NOTE — Consult Note (Signed)
NAME:  Mike Wright, MRN:  [id number], DOB:  [birth year], LOS: 0 ADMISSION DATE:  07/26/2019, CONSULTATION DATE:  07/26/2019  REFERRING MD:  Elgergawy,triad MD , CHIEF COMPLAINT:?  Anaphylaxis  Brief History   86 year old with CAD admitted with hypotension and chest pain following second infusion of Feraheme  History of present illness   86 year old man who was hospitalized 11/2018 for GI bleed and iron deficiency anemia.  He is on Eliquis for atrial fibrillation and Plavix for cardiac stents 2019.  Anemia did not respond to oral iron and IV iron infusion was recommended, he received first dose uneventfully on 4/16. Developed substernal chest pain radiating to back with nausea and vomiting 5 minutes into the infusion 4/30.  Developed hypotension 54/38 and saturations 80% on 4 L taken to ED. no wheezing was noted by EDP, given epinephrine subcu x2 , IV fluids, IV Benadryl, Solu-Medrol and Pepcid On my arrival patient sitting up in bed, blood pressure initially 110/6810 79/58  Past Medical History  CAD with stents Atrial fibrillation Hypertension Chronic diastolic CHF Hypothyroidism  Significant Hospital Events     Consults:    Procedures:    Significant Diagnostic Tests:    Micro Data:    Antimicrobials:     Interim history/subjective:  Complains of 5/10 substernal chest pain Neck pain and back pain  Objective   Blood pressure (!) 78/48, pulse 87, temperature 97.8 F (36.6 C), temperature source Oral, resp. rate (!) 30, height 5\' 7"  (1.702 m), weight 85 kg, SpO2 100 %.        Intake/Output Summary (Last 24 hours) at 07/26/2019 1556 Last data filed at 07/26/2019 1415 Gross per 24 hour  Intake 50 ml  Output --  Net 50 ml   Filed Weights   07/26/19 1358  Weight: 85 kg    Examination: General: Elderly man, sitting up in bed, able to speak in full sentences, mild distress HENT: Mild pallor, no icterus, no JVD, no lymphadenopathy, no lingual edema Lungs: Clear  to auscultation Cardiovascular: S1-S2 regular, tacky Abdomen: Soft, nontender Extremities: No deformity, weak pulses both radial Neuro: Alert, interactive, nonfocal  EKG does not show any ST-T wave changes Chest x-ray personally reviewed which shows low lung volumes, not convincing for effusions or infiltrates  Resolved Hospital Problem list     Assessment & Plan:  We will treat as anaphylactic reaction to iron infusion although strictly speaking he only had 1 system involvement with hypotension and chest pain (perhaps nausea vomiting can be second system) Neck pain and back pain has been described with nonallergic reactions to iron infusions His chest pain has improved to 5/10 and hypotension has also improved.  He does not have bronchospasm.  Recommend Normal saline bolus, IV fluids can be decreased once blood pressure consistently improved -IV Solu-Medrol 80 every 6 for 2-3 doses, this can be stopped once his clinical situation improves -H1 and H2 blockers -Telemetry monitoring, ICU status if hypotension does not improve within the next 2 hours   Labs   CBC: Recent Labs  Lab 07/26/19 1412  WBC 2.5*  NEUTROABS 0.3*  HGB 11.7*  HCT 40.0  MCV 81.0  PLT 123XX123    Basic Metabolic Panel: Recent Labs  Lab 07/26/19 1412  NA 136  K 3.6  CL 100  CO2 22  GLUCOSE 204*  BUN 21  CREATININE 1.34*  CALCIUM 8.4*   GFR: Estimated Creatinine Clearance: 43.5 mL/min (A) (by C-G formula based on SCr of 1.34 mg/dL (H)). Recent Labs  Lab 07/26/19 1412  WBC 2.5*    Liver Function Tests: No results for input(s): AST, ALT, ALKPHOS, BILITOT, PROT, ALBUMIN in the last 168 hours. No results for input(s): LIPASE, AMYLASE in the last 168 hours. No results for input(s): AMMONIA in the last 168 hours.  ABG    Component Value Date/Time   PHART 7.397 05/18/2017 1450   PCO2ART 40.3 05/18/2017 1450   PO2ART 101.0 05/18/2017 1450   HCO3 26.7 05/18/2017 1543   TCO2 23 11/17/2017 1703    ACIDBASEDEF 4.0 (H) 03/30/2010 0922   O2SAT 65.0 05/18/2017 1543     Coagulation Profile: No results for input(s): INR, PROTIME in the last 168 hours.  Cardiac Enzymes: No results for input(s): CKTOTAL, CKMB, CKMBINDEX, TROPONINI in the last 168 hours.  HbA1C: Hgb A1c MFr Bld  Date/Time Value Ref Range Status  03/01/2019 03:20 AM 7.1 (H) 4.8 - 5.6 % Final    Comment:    (NOTE) Pre diabetes:          5.7%-6.4% Diabetes:              >6.4% Glycemic control for   <7.0% adults with diabetes   11/26/2018 10:36 AM 6.1 (H) 4.8 - 5.6 % Final    Comment:    (NOTE) Pre diabetes:          5.7%-6.4% Diabetes:              >6.4% Glycemic control for   <7.0% adults with diabetes     CBG: Recent Labs  Lab 07/26/19 1327  GLUCAP 179*    Review of Systems:   Positive for neck pain and back pain Chest pain 5/10 Dyspnea-improved since onset No wheezing, hemoptysis No pedal edema No vomiting or diarrhea No skin rash  Past Medical History  He,  has a past medical history of Anemia, CAD in native artery, Chronic diastolic CHF (congestive heart failure) ([place]), Constipation, COPD (chronic obstructive pulmonary disease) ([place]), Diabetic ulcer of toe ([place]), DJD (degenerative joint disease) of cervical spine, Dysrhythmia, Essential hypertension, GERD (gastroesophageal reflux disease), History of hiatal hernia, HIT (heparin-induced thrombocytopenia) (HCC), Hypothyroidism, Hypoxia, Insomnia, Mixed hyperlipidemia, PAD (peripheral artery disease) (HCC), PAF (paroxysmal atrial fibrillation) ([place]), PVD (peripheral vascular disease) ([place]), Renal insufficiency, Retinal hemorrhage, Sinus bradycardia, Sleep apnea, Type 2 diabetes mellitus ([place]), and Wheezing.   Surgical History    Past Surgical History:  Procedure Laterality Date  . ABDOMINAL AORTOGRAM W/LOWER EXTREMITY N/A 09/15/2017   Procedure: ABDOMINAL AORTOGRAM W/LOWER EXTREMITY;  Surgeon: Serafina Mitchell, MD;  Location: [facility];   Service: Cardiovascular;  Laterality: N/A;  . AMPUTATION Bilateral 09/20/2017   Procedure: BILATERAL GREAT TOE AMPUTATIONS INCLUDING METATARSAL HEADS;  Surgeon: Serafina Mitchell, MD;  [facility];  Service: Vascular;  Laterality: Bilateral;  . AMPUTATION Right 03/01/2019   Procedure: AMPUTATION TOES;  Surgeon: Angelia Mould, MD;  Location: [facility];  Service: Vascular;  Laterality: Right;  . AMPUTATION Right 05/29/2019   Procedure: AMPUTATION RIGHT THIRD TOE;  Surgeon: Serafina Mitchell, MD;  Location: [facility];  Service: Vascular;  Laterality: Right;  . ANGIOPLASTY Right 09/20/2017   Procedure: ANGIOPLASTY RIGHT TIBIAL ARTERY;  Surgeon: Serafina Mitchell, MD;  Location: [facility];  Service: Vascular;  Laterality: Right;  . APPENDECTOMY    . BACK SURGERY    . CARDIAC CATHETERIZATION  1980s; 2012;  . CATARACT EXTRACTION Left 2004  . COLONOSCOPY  2004   FLEISHMAN TICS  . COLONOSCOPY WITH PROPOFOL N/A 11/28/2018  Procedure: COLONOSCOPY WITH PROPOFOL;  Surgeon: Rogene Houston, MD;  [facility];  Service: Endoscopy;  Laterality: N/A;  . CORONARY ANGIOPLASTY WITH STENT PLACEMENT  05/18/2017   "3 stents"  . CORONARY STENT INTERVENTION N/A 05/18/2017   Procedure: CORONARY STENT INTERVENTION;  Surgeon: Jettie Booze, MD;  Location: [facility];  Service: Cardiovascular;  Laterality: N/A;  . ESOPHAGOGASTRODUODENOSCOPY (EGD) WITH PROPOFOL N/A 11/20/2017   Procedure: ESOPHAGOGASTRODUODENOSCOPY (EGD) WITH PROPOFOL;  Surgeon: Irene Shipper, MD;  Location: [facility];  Service: Gastroenterology;  Laterality: N/A;  . ESOPHAGOGASTRODUODENOSCOPY (EGD) WITH PROPOFOL N/A 11/28/2018   Procedure: ESOPHAGOGASTRODUODENOSCOPY (EGD) WITH PROPOFOL;  Surgeon: Rogene Houston, MD;  [facility];  Service: Endoscopy;  Laterality: N/A;  . ESOPHAGOGASTRODUODENOSCOPY (EGD) WITH PROPOFOL N/A 07/12/2019   Procedure: ESOPHAGOGASTRODUODENOSCOPY (EGD) WITH PROPOFOL;  Surgeon: Rogene Houston,  MD;  [facility];  Service: Endoscopy;  Laterality: N/A;  125  . GIVENS CAPSULE STUDY N/A 12/17/2018   Procedure: GIVENS CAPSULE STUDY;  Surgeon: Rogene Houston, MD;  [facility];  Service: Endoscopy;  Laterality: N/A;  7:30am  . I & D EXTREMITY Right 12/17/2017   Procedure: IRRIGATION AND DEBRIDEMENT RIGHT GREAT TOE;  Surgeon: Serafina Mitchell, MD;  [facility];  Service: Vascular;  Laterality: Right;  . JOINT REPLACEMENT    . LEFT HEART CATH AND CORONARY ANGIOGRAPHY N/A 05/18/2017   Procedure: LEFT HEART CATH AND CORONARY ANGIOGRAPHY;  Surgeon: Jettie Booze, MD;  Location: [facility];  Service: Cardiovascular;  Laterality: N/A;  . LOWER EXTREMITY ANGIOGRAM Right 09/20/2017   Procedure: RIGHT LOWER LEG  ANGIOGRAM;  Surgeon: Serafina Mitchell, MD;  Location: [facility];  Service: Vascular;  Laterality: Right;  . LUMBAR FUSION  2002   L3, 4 L4, 5 L5 S1 Fused by Dr. Glenna Fellows  . PERIPHERAL VASCULAR BALLOON ANGIOPLASTY Left 09/15/2017   Procedure: PERIPHERAL VASCULAR BALLOON ANGIOPLASTY;  Surgeon: Serafina Mitchell, MD;  Location: [facility];  Service: Cardiovascular;  Laterality: Left;  PTA of Peroneal & Posterior Tibial  . POLYPECTOMY  11/28/2018   Procedure: POLYPECTOMY;  Surgeon: Rogene Houston, MD;  [facility];  Service: Endoscopy;;  duodenum  . POSTERIOR LUMBAR FUSION    . RHINOPLASTY    . RIGHT HEART CATH N/A 05/18/2017   Procedure: RIGHT HEART CATH;  Surgeon: Jettie Booze, MD;  Location: [facility];  Service: Cardiovascular;  Laterality: N/A;  . TEE WITHOUT CARDIOVERSION N/A 09/18/2017   Procedure: TRANSESOPHAGEAL ECHOCARDIOGRAM (TEE);  Surgeon: Fay Records, MD;  Location: [facility];  Service: Cardiovascular;  Laterality: N/A;  . TOTAL KNEE ARTHROPLASTY Bilateral   . TRANSURETHRAL RESECTION OF PROSTATE  2001   [place] History   reports that he quit smoking about 36 years ago. His smoking use included  cigarettes. He started smoking about 66 years ago. He has a 60.00 pack-year smoking history. He has never used smokeless tobacco. He reports previous alcohol use. He reports that he does not use drugs.   Family History   His family history includes COPD in his father and mother; CVA in his sister; Diabetes in his mother and sister; Heart attack (age of onset: 36) in his father; Heart disease in his mother; Hypertension in his sister; Multiple sclerosis in his sister.   Allergies Allergies  Allergen Reactions  . Bactrim [Sulfamethoxazole-Trimethoprim]     Pancytopenia  . Codeine  Shortness Of Breath  . Heparin Other (See Comments)    +HIT,  Severe bleeding (with heparin drip & large doses), tolerates low doses  . Losartan Swelling    Per ENT UNSPECIFIED REACTION   . Oxycodone Other (See Comments)    "Made me act out of my mind" Mental status changes hallucinations  . Feraheme [Ferumoxytol]     Diaphoretic, chest pain     Home Medications  Prior to Admission medications   Medication Sig Start Date End Date Taking? Authorizing Provider  acetaminophen (TYLENOL) 500 MG tablet Take 1 tablet (500 mg total) by mouth every 6 (six) hours as needed for mild pain or headache. 03/03/19   Allie Bossier, MD  atorvastatin (LIPITOR) 80 MG tablet Take 80 mg by mouth every evening.  05/23/19   [provider]  Cholecalciferol (VITAMIN D) 50 MCG (2000 UT) tablet Take 2,000 Units by mouth daily.    [provider]  clopidogrel (PLAVIX) 75 MG tablet Take 75 mg by mouth daily.    [provider]  Cyanocobalamin (B-12) 2500 MCG TABS Take 2,500 mcg by mouth daily.    [provider]  ELIQUIS 5 MG TABS tablet TAKE 1 TABLET TWICE DAILY Patient taking differently: Take 5 mg by mouth 2 (two) times daily.  02/25/19   Arnoldo Lenis, MD  folic acid (FOLVITE) 1 MG tablet Take 1 tablet (1 mg total) by mouth daily. 12/06/17   Oswald Hillock, MD  furosemide (LASIX) 40 MG tablet  Take 40 mg by mouth 2 (two) times daily.     [provider]  insulin glargine (LANTUS) 100 UNIT/ML injection Inject 30-40 Units into the skin See admin instructions. Inject 30 units SQ in the morning and inject 40 units SQ at bedtime    [provider]  isosorbide mononitrate (IMDUR) 30 MG 24 hr tablet TAKE 1/2 TABLET EVERY DAY Patient taking differently: Take 15 mg by mouth daily.  04/22/19   Arnoldo Lenis, MD  lisinopril (ZESTRIL) 5 MG tablet Take 0.5 tablets (2.5 mg total) by mouth daily. Patient taking differently: Take 5 mg by mouth daily.  11/29/18   Barton Dubois, MD  meclizine (ANTIVERT) 25 MG tablet Take 25 mg by mouth daily as needed for dizziness.    [provider]  metFORMIN (GLUCOPHAGE) 1000 MG tablet Take 1,000 mg by mouth 2 (two) times daily. 05/22/17   [provider]  nitroGLYCERIN (NITROSTAT) 0.4 MG SL tablet PLACE 1 TABLET (0.4 MG TOTAL) UNDER THE TONGUE EVERY 5  MINUTES AS NEEDED FOR CHEST PAIN. Patient taking differently: Place 0.4 mg under the tongue every 5 (five) minutes as needed for chest pain.  05/22/17   Arnoldo Lenis, MD  pantoprazole (PROTONIX) 40 MG tablet Take 1 tablet (40 mg total) by mouth 2 (two) times daily. To protect stomach while taking multiple blood thinners. 11/29/18   Barton Dubois, MD  Pediatric Multivitamins-Iron [facility] COMPLETE) 18 MG CHEW Chew 1 tablet by mouth 2 (two) times daily. Patient taking differently: Chew 1 tablet by mouth daily.  02/14/19   Rehman, Mechele Dawley, MD  potassium chloride SA (K-DUR) 20 MEQ tablet Take 1 tablet (20 mEq total) by mouth daily. 11/30/18   Barton Dubois, MD  psyllium (METAMUCIL) 58.6 % packet Take 0.5 packets by mouth 2 (two) times daily.     [provider]  traZODone (DESYREL) 100 MG tablet Take 100 mg by mouth at bedtime.  03/25/16   [provider]  Critical care time: [place] MD. [facility]. [facility]  If no  response to pager , please call [phone]   07/26/2019

## 2019-07-27 DIAGNOSIS — N183 Chronic kidney disease, stage 3 unspecified: Secondary | ICD-10-CM | POA: Diagnosis not present

## 2019-07-27 DIAGNOSIS — N179 Acute kidney failure, unspecified: Secondary | ICD-10-CM | POA: Diagnosis not present

## 2019-07-27 DIAGNOSIS — I25118 Atherosclerotic heart disease of native coronary artery with other forms of angina pectoris: Secondary | ICD-10-CM | POA: Diagnosis not present

## 2019-07-27 DIAGNOSIS — E1122 Type 2 diabetes mellitus with diabetic chronic kidney disease: Secondary | ICD-10-CM | POA: Diagnosis not present

## 2019-07-27 DIAGNOSIS — I2510 Atherosclerotic heart disease of native coronary artery without angina pectoris: Secondary | ICD-10-CM

## 2019-07-27 DIAGNOSIS — T782XXD Anaphylactic shock, unspecified, subsequent encounter: Secondary | ICD-10-CM

## 2019-07-27 DIAGNOSIS — J431 Panlobular emphysema: Secondary | ICD-10-CM

## 2019-07-27 DIAGNOSIS — J9611 Chronic respiratory failure with hypoxia: Secondary | ICD-10-CM

## 2019-07-27 DIAGNOSIS — I482 Chronic atrial fibrillation, unspecified: Secondary | ICD-10-CM | POA: Diagnosis not present

## 2019-07-27 DIAGNOSIS — I208 Other forms of angina pectoris: Secondary | ICD-10-CM

## 2019-07-27 DIAGNOSIS — R338 Other retention of urine: Secondary | ICD-10-CM

## 2019-07-27 DIAGNOSIS — Z9861 Coronary angioplasty status: Secondary | ICD-10-CM

## 2019-07-27 LAB — CBC
HCT: 28.8 % — ABNORMAL LOW (ref 39.0–52.0)
Hemoglobin: 8.7 g/dL — ABNORMAL LOW (ref 13.0–17.0)
MCH: 24.6 pg — ABNORMAL LOW (ref 26.0–34.0)
MCHC: 30.2 g/dL (ref 30.0–36.0)
MCV: 81.4 fL (ref 80.0–100.0)
Platelets: 169 10*3/uL (ref 150–400)
RBC: 3.54 MIL/uL — ABNORMAL LOW (ref 4.22–5.81)
RDW: 25.1 % — ABNORMAL HIGH (ref 11.5–15.5)
WBC: 10.5 10*3/uL (ref 4.0–10.5)
nRBC: 0 % (ref 0.0–0.2)

## 2019-07-27 LAB — COMPREHENSIVE METABOLIC PANEL
ALT: 15 U/L (ref 0–44)
AST: 24 U/L (ref 15–41)
Albumin: 3.8 g/dL (ref 3.5–5.0)
Alkaline Phosphatase: 49 U/L (ref 38–126)
Anion gap: 15 (ref 5–15)
BUN: 36 mg/dL — ABNORMAL HIGH (ref 8–23)
CO2: 21 mmol/L — ABNORMAL LOW (ref 22–32)
Calcium: 8.3 mg/dL — ABNORMAL LOW (ref 8.9–10.3)
Chloride: 100 mmol/L (ref 98–111)
Creatinine, Ser: 2.04 mg/dL — ABNORMAL HIGH (ref 0.61–1.24)
GFR calc Af Amer: 34 mL/min — ABNORMAL LOW (ref >60)
GFR calc non Af Amer: 29 mL/min — ABNORMAL LOW (ref >60)
Glucose, Bld: 275 mg/dL — ABNORMAL HIGH (ref 70–99)
Potassium: 5 mmol/L (ref 3.5–5.1)
Sodium: 136 mmol/L (ref 135–145)
Total Bilirubin: 0.8 mg/dL (ref 0.3–1.2)
Total Protein: 6.3 g/dL — ABNORMAL LOW (ref 6.5–8.1)

## 2019-07-27 LAB — TROPONIN I (HIGH SENSITIVITY)
Troponin I (High Sensitivity): 51 ng/L — ABNORMAL HIGH (ref <18)
Troponin I (High Sensitivity): 35 ng/L — ABNORMAL HIGH (ref <18)

## 2019-07-27 LAB — MRSA PCR SCREENING: MRSA by PCR: NEGATIVE

## 2019-07-27 MED ORDER — ISOSORBIDE MONONITRATE ER 30 MG PO TB24
15.0000 mg | ORAL_TABLET | Freq: Every day | ORAL | Status: DC
  Administered 2019-07-27 – 2019-07-28 (×2): 15 mg via ORAL
  Filled 2019-07-27 (×2): qty 1

## 2019-07-27 MED ORDER — VITAMIN B-12 1000 MCG PO TABS
2500.0000 ug | ORAL_TABLET | Freq: Every day | ORAL | Status: DC
  Administered 2019-07-27 – 2019-07-28 (×2): 2500 ug via ORAL
  Filled 2019-07-27 (×2): qty 3

## 2019-07-27 MED ORDER — CLOPIDOGREL BISULFATE 75 MG PO TABS
75.0000 mg | ORAL_TABLET | Freq: Every day | ORAL | Status: DC
  Administered 2019-07-27 – 2019-07-28 (×2): 75 mg via ORAL
  Filled 2019-07-27 (×2): qty 1

## 2019-07-27 MED ORDER — GUAIFENESIN-DM 100-10 MG/5ML PO SYRP
5.0000 mL | ORAL_SOLUTION | ORAL | Status: DC | PRN
  Administered 2019-07-27: 5 mL via ORAL
  Filled 2019-07-27: qty 5

## 2019-07-27 MED ORDER — SODIUM CHLORIDE 0.9% FLUSH
3.0000 mL | Freq: Two times a day (BID) | INTRAVENOUS | Status: DC
  Administered 2019-07-27 – 2019-07-28 (×2): 3 mL via INTRAVENOUS

## 2019-07-27 MED ORDER — NITROGLYCERIN 0.4 MG SL SUBL: 0.4000 mg | SUBLINGUAL_TABLET | SUBLINGUAL | Status: DC | PRN

## 2019-07-27 MED ORDER — ATORVASTATIN CALCIUM 40 MG PO TABS
80.0000 mg | ORAL_TABLET | Freq: Every evening | ORAL | Status: DC
  Administered 2019-07-27: 80 mg via ORAL
  Filled 2019-07-27: qty 2

## 2019-07-27 MED ORDER — APIXABAN 2.5 MG PO TABS
2.5000 mg | ORAL_TABLET | Freq: Two times a day (BID) | ORAL | Status: DC
  Administered 2019-07-27 – 2019-07-28 (×3): 2.5 mg via ORAL
  Filled 2019-07-27 (×3): qty 1

## 2019-07-27 MED ORDER — DIPHENHYDRAMINE HCL 50 MG/ML IJ SOLN: 12.5000 mg | Freq: Four times a day (QID) | INTRAMUSCULAR | Status: DC | PRN

## 2019-07-27 MED ORDER — INSULIN ASPART 100 UNIT/ML ~~LOC~~ SOLN
3.0000 [IU] | Freq: Three times a day (TID) | SUBCUTANEOUS | Status: DC
  Administered 2019-07-27 – 2019-07-28 (×2): 3 [IU] via SUBCUTANEOUS

## 2019-07-27 MED ORDER — CHLORHEXIDINE GLUCONATE CLOTH 2 % EX PADS
6.0000 | MEDICATED_PAD | Freq: Every day | CUTANEOUS | Status: DC
  Administered 2019-07-27 – 2019-07-28 (×2): 6 via TOPICAL

## 2019-07-27 MED ORDER — FINASTERIDE 5 MG PO TABS
5.0000 mg | ORAL_TABLET | Freq: Every day | ORAL | Status: DC
  Administered 2019-07-27 – 2019-07-28 (×2): 5 mg via ORAL
  Filled 2019-07-27 (×2): qty 1

## 2019-07-27 MED ORDER — TRAZODONE HCL 50 MG PO TABS
100.0000 mg | ORAL_TABLET | Freq: Every evening | ORAL | Status: DC | PRN
  Administered 2019-07-27: 100 mg via ORAL
  Filled 2019-07-27: qty 2

## 2019-07-27 NOTE — TOC Initial Note (Signed)
Transition of Care [facility]) - Initial/Assessment Note    Patient Details  Name: Mike Wright MRN: [id number] Date of Birth: [birth year]  [facility]) CM/SW Contact:    Shade Flood, LCSW Phone Number: 07/27/2019, 12:29 PM  Clinical Narrative:                  Pt from home with his wife. He has a RW and O2 for DME at home. Pt is active with [facility] CM with twice monthly phone visits for chronic disease management. TOC will follow and assist if dc needs arise.  Expected Discharge Plan: [place] Barriers to Discharge: Continued Medical Work up   Patient Goals and CMS Choice        Expected Discharge Plan and Services Expected Discharge Plan: [place]       Living arrangements for the past 2 months: Single Family Home                                      Prior Living Arrangements/Services Living arrangements for the past 2 months: Single Family Home Lives with:: Spouse Patient language and need for interpreter reviewed:: Yes Do you feel safe going back to the place where you live?: Yes      Need for Family Participation in Patient Care: Yes (Comment) Care giver support system in place?: Yes (comment) Current home services: DME Criminal Activity/Legal Involvement Pertinent to Current Situation/Hospitalization: No - Comment as needed  Activities of Daily Living Home Assistive Devices/Equipment: Cane (specify quad or straight), Eyeglasses ADL Screening (condition at time of admission) Patient's cognitive ability adequate to safely complete daily activities?: Yes Is the patient deaf or have difficulty hearing?: Yes Does the patient have difficulty seeing, even when wearing glasses/contacts?: Yes Does the patient have difficulty concentrating, remembering, or making decisions?: No Patient able to express need for assistance with ADLs?: Yes Does the patient have difficulty dressing or bathing?: No Independently performs  ADLs?: Yes (appropriate for developmental age) Does the patient have difficulty walking or climbing stairs?: Yes Weakness of Legs: Both Weakness of Arms/Hands: None  Permission Sought/Granted                  Emotional Assessment       Orientation: : Oriented to Self, Oriented to Place, Oriented to  Time, Oriented to Situation Alcohol / Substance Use: Not Applicable Psych Involvement: No (comment)  Admission diagnosis:  Anaphylaxis [T78.2XXA] Anaphylaxis, initial encounter [T78.2XXA] Patient Active Problem List   Diagnosis Date Noted  . Anaphylaxis 07/26/2019  . Gangrene of toe of right foot ([place]) 02/28/2019  . Gastrointestinal hemorrhage 12/10/2018  . Orthostatic hypotension   . Gastroesophageal reflux disease without esophagitis   . Chronic respiratory failure with hypoxia ([place])   . Bradycardia 11/26/2018  . Abdominal pain, epigastric 11/26/2018  . Acute blood loss anemia 11/26/2018  . PAD (peripheral artery disease) ([place]) 12/13/2017  . Non-healing surgical wound 12/13/2017  . Infected surgical wound 12/13/2017  . Paroxysmal atrial flutter ([place])   . Dyspnea 12/02/2017  . Pancytopenia ([place]) 12/02/2017  . Coronary artery disease involving native coronary artery of native heart without angina pectoris 11/20/2017  . Lower abdominal pain   . Nausea without vomiting   . Anemia 11/18/2017  . Volume overload 11/18/2017  . Chest pain 11/17/2017  . Gangrene ([place])   . Urinary retention 09/16/2017  .  MRSA bacteremia 09/15/2017  . Gangrene of bilateral great toes ([place]) 09/13/2017  . COPD (chronic obstructive pulmonary disease) ([place]) 05/19/2017  . Iron deficiency anemia due to chronic blood loss   . CAD S/P percutaneous coronary angioplasty   . Acute on chronic diastolic CHF (congestive heart failure) ([place])   . PSVT (paroxysmal supraventricular tachycardia) ([place])   . PAF (paroxysmal atrial fibrillation) ([place])   . Sleep apnea   . Type 2 diabetes mellitus ([place])   . Status  post coronary artery stent placement   . Essential hypertension 04/15/2010  . Diabetes ([place]) 04/14/2010   PCP:  Monico Blitz, MD Pharmacy:   [place] [address] [place] [address] Phone: [phone] Fax: [phone]     Social Determinants of Health (SDOH) Interventions    Readmission Risk Interventions Readmission Risk Prevention Plan 07/27/2019  Transportation Screening Complete  Palliative Care Screening Not Applicable  Medication Review (RN Care Manager) Complete  Some recent data might be hidden

## 2019-07-27 NOTE — Progress Notes (Signed)
PROGRESS NOTE   Mike Wright  [id number] DOB: 1933-11-05 DOA: 07/26/2019 PCP: Monico Blitz, MD   Chief Complaint  Patient presents with  . Allergic Reaction    Brief Narrative:  86 year old gentleman with coronary artery disease status post stent placement chronic chest pain A. fib on Eliquis, GERD, diabetes, OSA, COPD with chronic hypoxemia, chronic diastolic heart failure and peripheral vascular disease was admitted from the infusion center after having a reaction to a Feraheme infusion.  CODE BLUE was called and patient was hypotensive but never lost pulse.  He was given 2 doses of epinephrine and stabilized and then transferred to the ICU.  Assessment & Plan:   Active Problems:   Diabetes ([place])   Essential hypertension   CAD S/P percutaneous coronary angioplasty   PAF (paroxysmal atrial fibrillation) (HCC)   COPD (chronic obstructive pulmonary disease) (HCC)   Chest pain   PAD (peripheral artery disease) (HCC)   Chronic respiratory failure with hypoxia (HCC)   Anaphylaxis   1. Presumed anaphylactic reaction to IV Feraheme infusion-patient has been treated with steroids H2 blockers and supportive care with IV fluids and has improved.  I have discontinued the steroids and H2 blockers.  Reduce IV fluid rate.  Transfer out of the ICU. 2. Coronary artery disease status post percutaneous coronary angioplasty-on resuming all of his home cardiac medications and adding his home nitroglycerin as needed. 3. Stable angina-resume home cardiac medications noted above. 4. Acute urinary retention-Foley catheter has been placed.  Flomax ordered.  He may have to go home with a Foley catheter and follow-up with the urologist outpatient. 5. Chronic respiratory failure with hypoxia-stable no current symptoms continue supplemental oxygen support. 6. OSA will offer CPAP in the hospital. 7. Essential hypertension-blood pressures have rebounded nicely after aggressive treatments yesterday.  Resume  home BP medications. 8. Chronic atrial fibrillation-resume home apixaban for anticoagulation and rate controlling medications. 9. AKI - postobstructive from acute urinary retention, foley catheter placed, recheck renal function panel in AM.    DVT prophylaxis: apixaban  Code Status: partial  Family Communication: updated bedside  Disposition:   Status is: Inpatient  Remains inpatient appropriate because:IV treatments appropriate due to intensity of illness or inability to take PO   Dispo: The patient is from: Home              Anticipated d/c is to: Home              Anticipated d/c date is: 1 day              Patient currently is not medically stable to d/c.   Consultants:     Procedures:     Antimicrobials:     Subjective: Pt says he is feeling a lot better today.  He had some chest pain earlier today.  He says that this is chronic for him.   Objective: Vitals:   07/27/19 0806 07/27/19 0900 07/27/19 1220 07/27/19 1300  BP:    121/61  Pulse:      Resp:  17  20  Temp: 97.9 F (36.6 C)  98 F (36.7 C)   TempSrc: Oral  Oral   SpO2:      Weight:      Height:        Intake/Output Summary (Last 24 hours) at 07/27/2019 1543 Last data filed at 07/27/2019 0730 Gross per 24 hour  Intake 1366.68 ml  Output 800 ml  Net 566.68 ml   Filed Weights   07/26/19 1358  07/27/19 0418  Weight: 85 kg 88.8 kg    Examination:  General exam: Appears calm and comfortable  Respiratory system: Clear to auscultation. Respiratory effort normal. Cardiovascular system: S1 & S2 heard, RRR. No JVD, murmurs, rubs, gallops or clicks. No pedal edema. Gastrointestinal system: Abdomen is nondistended, soft and nontender. No organomegaly or masses felt. Normal bowel sounds heard. Central nervous system: Alert and oriented. No focal neurological deficits. Extremities: Symmetric 5 x 5 power. Skin: No rashes, lesions or ulcers Psychiatry: Judgement and insight appear normal. Mood & affect  appropriate.   Data Reviewed: I have personally reviewed following labs and imaging studies  CBC: Recent Labs  Lab 07/26/19 1412 07/27/19 0502  WBC 2.5* 10.5  NEUTROABS 0.3*  --   HGB 11.7* 8.7*  HCT 40.0 28.8*  MCV 81.0 81.4  PLT 151 [id number]    Basic Metabolic Panel: Recent Labs  Lab 07/26/19 1412 07/27/19 0502  NA 136 136  K 3.6 5.0  CL 100 100  CO2 22 21*  GLUCOSE 204* 275*  BUN 21 36*  CREATININE 1.34* 2.04*  CALCIUM 8.4* 8.3*    GFR: Estimated Creatinine Clearance: 29.2 mL/min (A) (by C-G formula based on SCr of 2.04 mg/dL (H)).  Liver Function Tests: Recent Labs  Lab 07/27/19 0502  AST 24  ALT 15  ALKPHOS 49  BILITOT 0.8  PROT 6.3*  ALBUMIN 3.8    CBG: Recent Labs  Lab 07/26/19 1327 07/26/19 2000  GLUCAP 179* 242*     Recent Results (from the past 240 hour(s))  Respiratory Panel by RT PCR (Flu A&B, Covid) - Nasopharyngeal Swab     Status: None   Collection Time: 07/26/19  3:04 PM   Specimen: Nasopharyngeal Swab  Result Value Ref Range Status   SARS Coronavirus 2 by RT PCR NEGATIVE NEGATIVE Final    Comment: (NOTE) SARS-CoV-2 target nucleic acids are NOT DETECTED. The SARS-CoV-2 RNA is generally detectable in upper respiratoy specimens during the acute phase of infection. The lowest concentration of SARS-CoV-2 viral copies this assay can detect is 131 copies/mL. A negative result does not preclude SARS-Cov-2 infection and should not be used as the sole basis for treatment or other patient management decisions. A negative result may occur with  improper specimen collection/handling, submission of specimen other than nasopharyngeal swab, presence of viral mutation(s) within the areas targeted by this assay, and inadequate number of viral copies (<131 copies/mL). A negative result must be combined with clinical observations, patient history, and epidemiological information. The expected result is Negative. Fact Sheet for Patients:    PinkCheek.be Fact Sheet for Healthcare Providers:  GravelBags.it This test is not yet ap proved or cleared by the [place] FDA and  has been authorized for detection and/or diagnosis of SARS-CoV-2 by FDA under an Emergency Use Authorization (EUA). This EUA will remain  in effect (meaning this test can be used) for the duration of the COVID-19 declaration under Section 564(b)(1) of the Act, 21 U.S.C. section 360bbb-3(b)(1), unless the authorization is terminated or revoked sooner.    Influenza A by PCR NEGATIVE NEGATIVE Final   Influenza B by PCR NEGATIVE NEGATIVE Final    Comment: (NOTE) The Xpert Xpress SARS-CoV-2/FLU/RSV assay is intended as an aid in  the diagnosis of influenza from Nasopharyngeal swab specimens and  should not be used as a sole basis for treatment. Nasal washings and  aspirates are unacceptable for Xpert Xpress SARS-CoV-2/FLU/RSV  testing. Fact Sheet for Patients: PinkCheek.be Fact Sheet for Healthcare Providers: GravelBags.it  This test is not yet approved or cleared by the [place] and  has been authorized for detection and/or diagnosis of SARS-CoV-2 by  FDA under an Emergency Use Authorization (EUA). This EUA will remain  in effect (meaning this test can be used) for the duration of the  Covid-19 declaration under Section 564(b)(1) of the Act, 21  U.S.C. section 360bbb-3(b)(1), unless the authorization is  terminated or revoked. Performed at [facility], [address], [place], [place] [address]   MRSA PCR Screening     Status: None   Collection Time: 07/26/19 11:00 PM   Specimen: Nasal Mucosa; Nasopharyngeal  Result Value Ref Range Status   MRSA by PCR NEGATIVE NEGATIVE Final    Comment:        The GeneXpert MRSA Assay (FDA approved for NASAL specimens only), is one component of a comprehensive MRSA  colonization surveillance program. It is not intended to diagnose MRSA infection nor to guide or monitor treatment for MRSA infections. Performed at [facility], [address] [place], [place] [address]     Radiology Studies: [facility] Chest [facility] 1 View  Result Date: 07/26/2019 CLINICAL DATA:  Chest pain. EXAM: PORTABLE CHEST 1 VIEW COMPARISON:  February 28, 2019. FINDINGS: Stable cardiomediastinal silhouette. No pneumothorax is noted. Minimal bibasilar subsegmental atelectasis is noted with minimal pleural effusions. Bony thorax is unremarkable. IMPRESSION: Minimal bibasilar subsegmental atelectasis with minimal pleural effusions. Electronically Signed   By: Marijo Conception M.D.   On: 07/26/2019 14:16   Scheduled Meds: . apixaban  2.5 mg Oral BID  . atorvastatin  80 mg Oral QPM  . Chlorhexidine Gluconate Cloth  6 each Topical Daily  . cholecalciferol  2,000 Units Oral Daily  . clopidogrel  75 mg Oral Daily  . folic acid  1 mg Oral Daily  . insulin aspart  0-15 Units Subcutaneous TID WC  . insulin glargine  15 Units Subcutaneous BID  . isosorbide mononitrate  15 mg Oral Daily  . mouth rinse  15 mL Mouth Rinse BID  . pantoprazole  40 mg Oral BID  . vitamin B-12  2,500 mcg Oral Daily   Continuous Infusions: . sodium chloride 50 mL/hr at 07/27/19 1252     LOS: 1 day   Critical Care Procedure Note Authorized and Performed by: Murvin Natal MD  Total Critical Care time:   Due to a high probability of clinically significant, life threatening deterioration, the patient required my highest level of preparedness to intervene emergently and I personally spent this critical care time directly and personally managing the patient.  This critical care time included obtaining a history; examining the patient, pulse oximetry; ordering and review of studies; arranging urgent treatment with development of a management plan; evaluation of patient's response of treatment; frequent reassessment; and  discussions with other providers.  This critical care time was performed to assess and manage the high probability of imminent and life threatening deterioration that could result in multi-organ failure.  It was exclusive of separately billable procedures and treating other patients and teaching time.    Irwin Brakeman, MD [facility]   To contact the attending provider between 7A-7P or the covering provider during after hours 7P-7A, please log into the web site [email or web address] and access using universal [place] password for that web site. If you do not have the password, please call the hospital operator.  07/27/2019, 3:43 PM

## 2019-07-28 ENCOUNTER — Encounter (HOSPITAL_COMMUNITY): Payer: Self-pay | Admitting: Internal Medicine

## 2019-07-28 DIAGNOSIS — E1152 Type 2 diabetes mellitus with diabetic peripheral angiopathy with gangrene: Secondary | ICD-10-CM

## 2019-07-28 DIAGNOSIS — I739 Peripheral vascular disease, unspecified: Secondary | ICD-10-CM

## 2019-07-28 DIAGNOSIS — Z794 Long term (current) use of insulin: Secondary | ICD-10-CM

## 2019-07-28 DIAGNOSIS — I10 Essential (primary) hypertension: Secondary | ICD-10-CM

## 2019-07-28 LAB — BASIC METABOLIC PANEL
Anion gap: 8 (ref 5–15)
BUN: 44 mg/dL — ABNORMAL HIGH (ref 8–23)
CO2: 25 mmol/L (ref 22–32)
Calcium: 8.6 mg/dL — ABNORMAL LOW (ref 8.9–10.3)
Chloride: 104 mmol/L (ref 98–111)
Creatinine, Ser: 1.62 mg/dL — ABNORMAL HIGH (ref 0.61–1.24)
GFR calc Af Amer: 45 mL/min — ABNORMAL LOW (ref >60)
GFR calc non Af Amer: 39 mL/min — ABNORMAL LOW (ref >60)
Glucose, Bld: 97 mg/dL (ref 70–99)
Potassium: 4.4 mmol/L (ref 3.5–5.1)
Sodium: 137 mmol/L (ref 135–145)

## 2019-07-28 LAB — CBC WITH DIFFERENTIAL/PLATELET
Abs Immature Granulocytes: 0.1 10*3/uL — ABNORMAL HIGH (ref 0.00–0.07)
Basophils Absolute: 0 10*3/uL (ref 0.0–0.1)
Basophils Relative: 0 %
Eosinophils Absolute: 0 10*3/uL (ref 0.0–0.5)
Eosinophils Relative: 0 %
HCT: 25.7 % — ABNORMAL LOW (ref 39.0–52.0)
Hemoglobin: 7.7 g/dL — ABNORMAL LOW (ref 13.0–17.0)
Immature Granulocytes: 1 %
Lymphocytes Relative: 9 %
Lymphs Abs: 0.8 10*3/uL (ref 0.7–4.0)
MCH: 23.8 pg — ABNORMAL LOW (ref 26.0–34.0)
MCHC: 30 g/dL (ref 30.0–36.0)
MCV: 79.3 fL — ABNORMAL LOW (ref 80.0–100.0)
Monocytes Absolute: 1.7 10*3/uL — ABNORMAL HIGH (ref 0.1–1.0)
Monocytes Relative: 19 %
Neutro Abs: 6.2 10*3/uL (ref 1.7–7.7)
Neutrophils Relative %: 71 %
Platelets: 166 10*3/uL (ref 150–400)
RBC: 3.24 MIL/uL — ABNORMAL LOW (ref 4.22–5.81)
RDW: 25.2 % — ABNORMAL HIGH (ref 11.5–15.5)
WBC: 8.9 10*3/uL (ref 4.0–10.5)
nRBC: 0 % (ref 0.0–0.2)

## 2019-07-28 LAB — PREPARE RBC (CROSSMATCH)

## 2019-07-28 LAB — MAGNESIUM: Magnesium: 2.2 mg/dL (ref 1.7–2.4)

## 2019-07-28 MED ORDER — FINASTERIDE 5 MG PO TABS: 5.0000 mg | ORAL_TABLET | Freq: Every day | ORAL | 1 refills | Status: DC

## 2019-07-28 MED ORDER — APIXABAN 2.5 MG PO TABS: 2.5000 mg | ORAL_TABLET | Freq: Two times a day (BID) | ORAL | 1 refills | Status: DC

## 2019-07-28 MED ORDER — SODIUM CHLORIDE 0.9% IV SOLUTION: Freq: Once | INTRAVENOUS | Status: DC

## 2019-07-28 MED ORDER — FUROSEMIDE 40 MG PO TABS
40.0000 mg | ORAL_TABLET | Freq: Every day | ORAL | Status: DC
  Administered 2019-07-28: 40 mg via ORAL
  Filled 2019-07-28: qty 1

## 2019-07-28 NOTE — Progress Notes (Signed)
Being discharge to home. IV access removed. Foley removed per patient request. Stated he does not need anything at home.

## 2019-07-28 NOTE — Discharge Summary (Addendum)
Physician Discharge Summary  Mike Wright [id number] DOB: [birth year] DOA: 07/26/2019  PCP: Monico Blitz, MD Urologist: Dr. Alyson Ingles Cardiologist: Dr. [name] GI: Dr. Laural Golden  Admit date: 07/26/2019 Discharge date: 07/28/2019  Admitted From:  Home  Disposition: Home (Pt declined [facility])   Recommendations for Outpatient Follow-up:  1. Follow up with urologist Dr. Alyson Ingles in 1-2 weeks for foley removal and voiding trial.  2. Follow up with cardiology and GI as scheduled.  3. Please obtain BMP/CBC in 2 weeks 4. Follow up with PCP in 1-2 weeks.   Discharge Condition: STABLE   CODE STATUS: PARTIAL: Bipap and Intubation only, no Code Blue, no CPR   Brief Hospitalization Summary: Please see all hospital notes, images, labs for full details of the hospitalization. ADMISSION HPI:  Mike Wright  is a 86 y.o. male,with medical history significant forCAD with prior stents, atrial fibrillation on Eliquis, GERD, type 2 diabetes, obstructive sleep apnea, COPD with chronic hypoxemia, chronic diastolic heart failure, and peripheral vascular disease , with recent hospitalization of September 2020 secondary to GI bleed and iron deficiency anemia, followed by Dr. Corbin Ade as an outpatient.  -CODE BLUE was called at infusion center, patient did not lose pulse, or blood pressure.  Apparently patient was receiving IV Feraheme transfusion, he was 5 minutes into his transfusion, where he started to complaints of chest pain, radiating to the back, diaphoresis, shortness of breath, and nausea, he was awake and alert through the entire event, infusion was stopped immediately, upon evaluation by ED physician responding to [place], patient had low blood pressure, but never lost his pulse, currently in ED patient still reports some chest pain radiating to the back, but this has significantly improved, his oxygen requirement significantly improved from 15 to 4 L, with history treating 100%, blood pressure remains on the lower  side with systolic hovering between 70s and 90s, but he is awake alert and appropriate and coherent, during that event patient did receive epinephrine x2, initially subcutaneous, then IM, as well he did receive IV steroids, IV Benadryl and IV Pepcid, treated hospitalist were consulted to admit, as well PCCM were consulted while patient is in ED. Brief Narrative:  86 year old gentleman with coronary artery disease status post stent placement chronic chest pain A. fib on Eliquis, GERD, diabetes, OSA, COPD with chronic hypoxemia, chronic diastolic heart failure and peripheral vascular disease was admitted from the infusion center after having a reaction to a Feraheme infusion.  CODE BLUE was called and patient was hypotensive but never lost pulse.  He was given 2 doses of epinephrine and stabilized and then transferred to the ICU.  Assessment & Plan:   Active Problems:   Diabetes ([place])   Essential hypertension   CAD S/P percutaneous coronary angioplasty   PAF (paroxysmal atrial fibrillation) (HCC)   COPD (chronic obstructive pulmonary disease) (HCC)   Chest pain   PAD (peripheral artery disease) (HCC)   Chronic respiratory failure with hypoxia (HCC)   Anaphylaxis   1. Presumed anaphylactic reaction to IV Feraheme infusion-patient has been treated with steroids H2 blockers and supportive care with IV fluids and has improved.  I have discontinued the steroids and H2 blockers.  He has remained stable for more than 36 hours.  OK to DC  Home.  2. Coronary artery disease status post percutaneous coronary angioplasty-on resuming all of his home cardiac medications and adding his home nitroglycerin as needed.  Hg down to 7.7.  1 unit PRBC given on 07/28/19.  3. Stable angina-resume home  cardiac medications noted above. 4. Acute urinary retention-Foley catheter has been placed.  proscar ordered.  He prefers to go home with a Foley catheter and follow-up with the urologist outpatient.  He is followed by Dr.  Alyson Ingles with [facility].  Pt advised to follow up with urologist office in 1-2 weeks for Voiding trial.  Pt verbalized understanding.   5. Chronic respiratory failure with hypoxia-stable no current symptoms continue supplemental oxygen support. 6. OSA will offer CPAP in the hospital. 7. Essential hypertension-blood pressures have rebounded nicely after aggressive treatments yesterday.  Resume home BP medications. 8. Chronic atrial fibrillation-resume home apixaban for anticoagulation and rate controlling medications. 9. AKI on stage 3 CKD  - postobstructive from acute urinary retention, foley catheter placed and repeat renal function test shows improvement.   10. Anemia in CKD - drop in Hg likely hemodilution, I spoke with Dr. Laural Golden his GI doctor yesterday and patient has had a thorough GI work up and no GI bleeding found.  I have transfused him 1 unit PRBC today.  Follow up as recommended.    DVT prophylaxis: apixaban  Code Status: partial  Family Communication: updated bedside  Disposition:    Discharge Diagnoses:  Active Problems:   Diabetes ([place])   Essential hypertension   CAD S/P percutaneous coronary angioplasty   PAF (paroxysmal atrial fibrillation) (HCC)   COPD (chronic obstructive pulmonary disease) (HCC)   Chest pain   PAD (peripheral artery disease) (HCC)   Chronic respiratory failure with hypoxia (HCC)   Anaphylaxis   Acute urinary retention   Discharge Instructions:  Allergies as of 07/28/2019      Reactions   Bactrim [sulfamethoxazole-trimethoprim]    Pancytopenia   Codeine Shortness Of Breath   Heparin Other (See Comments)   +HIT,  Severe bleeding (with heparin drip & large doses), tolerates low doses   Losartan Swelling   Per ENT UNSPECIFIED REACTION    Oxycodone Other (See Comments)   "Made me act out of my mind" Mental status changes hallucinations   Feraheme [ferumoxytol]    Diaphoretic, chest pain      Medication List    TAKE these  medications   acetaminophen 500 MG tablet Commonly known as: TYLENOL Take 1 tablet (500 mg total) by mouth every 6 (six) hours as needed for mild pain or headache.   apixaban 2.5 MG Tabs tablet Commonly known as: Eliquis Take 1 tablet (2.5 mg total) by mouth 2 (two) times daily. What changed:   medication strength  how much to take   atorvastatin 80 MG tablet Commonly known as: LIPITOR Take 80 mg by mouth every evening.   B-12 2500 MCG Tabs Take 2,500 mcg by mouth daily.   clopidogrel 75 MG tablet Commonly known as: PLAVIX Take 75 mg by mouth daily.   finasteride 5 MG tablet Commonly known as: PROSCAR Take 1 tablet (5 mg total) by mouth daily. Start taking on: Jul 29, 2019   Flintstones Complete 18 MG Chew Chew 1 tablet by mouth 2 (two) times daily. What changed: when to take this   folic acid 1 MG tablet Commonly known as: FOLVITE Take 1 tablet (1 mg total) by mouth daily.   furosemide 40 MG tablet Commonly known as: LASIX Take 40 mg by mouth 2 (two) times daily.   insulin glargine 100 UNIT/ML injection Commonly known as: LANTUS Inject 30-40 Units into the skin See admin instructions. Inject 30 units SQ in the morning and inject 40 units SQ at bedtime  isosorbide mononitrate 30 MG 24 hr tablet Commonly known as: IMDUR TAKE 1/2 TABLET EVERY DAY   lisinopril 5 MG tablet Commonly known as: ZESTRIL Take 0.5 tablets (2.5 mg total) by mouth daily. What changed: how much to take   meclizine 25 MG tablet Commonly known as: ANTIVERT Take 25 mg by mouth daily as needed for dizziness.   metFORMIN 1000 MG tablet Commonly known as: GLUCOPHAGE Take 1,000 mg by mouth 2 (two) times daily.   nitroGLYCERIN 0.4 MG SL tablet Commonly known as: NITROSTAT PLACE 1 TABLET (0.4 MG TOTAL) UNDER THE TONGUE EVERY 5  MINUTES AS NEEDED FOR CHEST PAIN. What changed: See the new instructions.   pantoprazole 40 MG tablet Commonly known as: Protonix Take 1 tablet (40 mg total) by  mouth 2 (two) times daily. To protect stomach while taking multiple blood thinners.   potassium chloride SA 20 MEQ tablet Commonly known as: KLOR-CON Take 1 tablet (20 mEq total) by mouth daily.   psyllium 58.6 % packet Commonly known as: METAMUCIL Take 0.5 packets by mouth 2 (two) times daily.   traZODone 100 MG tablet Commonly known as: DESYREL Take 100 mg by mouth at bedtime.   Vitamin D 50 MCG (2000 UT) tablet Take 2,000 Units by mouth daily.      Follow-up Information    Monico Blitz, MD. Schedule an appointment as soon as possible for a visit in 2 week(s).   Specialty: Internal Medicine Contact information: [place] [address] [phone]        Arnoldo Lenis, MD. Schedule an appointment as soon as possible for a visit in 3 week(s).   Specialty: Cardiology Contact information: [place] [address] [phone]        Rogene Houston, MD. Schedule an appointment as soon as possible for a visit in 3 week(s).   Specialty: Gastroenterology Contact information: [place], [address] [place] [address] [phone]        Cleon Gustin, MD. Schedule an appointment as soon as possible for a visit in 1 week(s).   Specialty: Urology Why: Hospital Follow Up for Urinary Retention  Contact information: [address] [place] [address] [phone]          Allergies  Allergen Reactions  . Bactrim [Sulfamethoxazole-Trimethoprim]     Pancytopenia  . Codeine Shortness Of Breath  . Heparin Other (See Comments)    +HIT,  Severe bleeding (with heparin drip & large doses), tolerates low doses  . Losartan Swelling    Per ENT UNSPECIFIED REACTION   . Oxycodone Other (See Comments)    "Made me act out of my mind" Mental status changes hallucinations  . Feraheme [Ferumoxytol]     Diaphoretic, chest pain   Allergies as of 07/28/2019      Reactions   Bactrim [sulfamethoxazole-trimethoprim]     Pancytopenia   Codeine Shortness Of Breath   Heparin Other (See Comments)   +HIT,  Severe bleeding (with heparin drip & large doses), tolerates low doses   Losartan Swelling   Per ENT UNSPECIFIED REACTION    Oxycodone Other (See Comments)   "Made me act out of my mind" Mental status changes hallucinations   Feraheme [ferumoxytol]    Diaphoretic, chest pain      Medication List    TAKE these medications   acetaminophen 500 MG tablet Commonly known as: TYLENOL Take 1 tablet (500 mg total) by mouth every 6 (  six) hours as needed for mild pain or headache.   apixaban 2.5 MG Tabs tablet Commonly known as: Eliquis Take 1 tablet (2.5 mg total) by mouth 2 (two) times daily. What changed:   medication strength  how much to take   atorvastatin 80 MG tablet Commonly known as: LIPITOR Take 80 mg by mouth every evening.   B-12 2500 MCG Tabs Take 2,500 mcg by mouth daily.   clopidogrel 75 MG tablet Commonly known as: PLAVIX Take 75 mg by mouth daily.   finasteride 5 MG tablet Commonly known as: PROSCAR Take 1 tablet (5 mg total) by mouth daily. Start taking on: Jul 29, 2019   Flintstones Complete 18 MG Chew Chew 1 tablet by mouth 2 (two) times daily. What changed: when to take this   folic acid 1 MG tablet Commonly known as: FOLVITE Take 1 tablet (1 mg total) by mouth daily.   furosemide 40 MG tablet Commonly known as: LASIX Take 40 mg by mouth 2 (two) times daily.   insulin glargine 100 UNIT/ML injection Commonly known as: LANTUS Inject 30-40 Units into the skin See admin instructions. Inject 30 units SQ in the morning and inject 40 units SQ at bedtime   isosorbide mononitrate 30 MG 24 hr tablet Commonly known as: IMDUR TAKE 1/2 TABLET EVERY DAY   lisinopril 5 MG tablet Commonly known as: ZESTRIL Take 0.5 tablets (2.5 mg total) by mouth daily. What changed: how much to take   meclizine 25 MG tablet Commonly known as: ANTIVERT Take 25 mg by mouth daily as  needed for dizziness.   metFORMIN 1000 MG tablet Commonly known as: GLUCOPHAGE Take 1,000 mg by mouth 2 (two) times daily.   nitroGLYCERIN 0.4 MG SL tablet Commonly known as: NITROSTAT PLACE 1 TABLET (0.4 MG TOTAL) UNDER THE TONGUE EVERY 5  MINUTES AS NEEDED FOR CHEST PAIN. What changed: See the new instructions.   pantoprazole 40 MG tablet Commonly known as: Protonix Take 1 tablet (40 mg total) by mouth 2 (two) times daily. To protect stomach while taking multiple blood thinners.   potassium chloride SA 20 MEQ tablet Commonly known as: KLOR-CON Take 1 tablet (20 mEq total) by mouth daily.   psyllium 58.6 % packet Commonly known as: METAMUCIL Take 0.5 packets by mouth 2 (two) times daily.   traZODone 100 MG tablet Commonly known as: DESYREL Take 100 mg by mouth at bedtime.   Vitamin D 50 MCG (2000 UT) tablet Take 2,000 Units by mouth daily.       Procedures/Studies: DG Chest Port 1 View  Result Date: 07/26/2019 CLINICAL DATA:  Chest pain. EXAM: PORTABLE CHEST 1 VIEW COMPARISON:  February 28, 2019. FINDINGS: Stable cardiomediastinal silhouette. No pneumothorax is noted. Minimal bibasilar subsegmental atelectasis is noted with minimal pleural effusions. Bony thorax is unremarkable. IMPRESSION: Minimal bibasilar subsegmental atelectasis with minimal pleural effusions. Electronically Signed   By: Marijo Conception M.D.   On: 07/26/2019 14:16      Subjective: Pt reports that he feels much better today.  He would prefer to keep the foley and follow up with his urologist next week.  He is followed by Dr. Alyson Ingles.    Discharge Exam: Vitals:   07/28/19 1001 07/28/19 1155  BP: (!) 129/59 (!) 151/65  Pulse: (!) 53 (!) 53  Resp: (!) 30 17  Temp: 97.7 F (36.5 C) (!) 97.5 F (36.4 C)  SpO2: 98% 100%   Vitals:   07/28/19 0930 07/28/19 1000 07/28/19 1001 07/28/19 1155  BP: (!) 146/63 (!) 129/59 (!) 129/59 (!) 151/65  Pulse: 84 (!) 57 (!) 53 (!) 53  Resp: 19 (!) 22 (!) 30 17   Temp: 97.7 F (36.5 C)  97.7 F (36.5 C) (!) 97.5 F (36.4 C)  TempSrc: Oral  Oral Oral  SpO2: 94% 97% 98% 100%  Weight:      Height:       General: Pt is alert, awake, not in acute distress Cardiovascular: RRR, S1/S2 +, no rubs, no gallops Respiratory: CTA bilaterally, no wheezing, no rhonchi Abdominal: Soft, NT, ND, bowel sounds + Extremities: no edema, no cyanosis GU: foley in place.    The results of significant diagnostics from this hospitalization (including imaging, microbiology, ancillary and laboratory) are listed below for reference.     Microbiology: Recent Results (from the past 240 hour(s))  Respiratory Panel by RT PCR (Flu A&B, Covid) - Nasopharyngeal Swab     Status: None   Collection Time: 07/26/19  3:04 PM   Specimen: Nasopharyngeal Swab  Result Value Ref Range Status   SARS Coronavirus 2 by RT PCR NEGATIVE NEGATIVE Final    Comment: (NOTE) SARS-CoV-2 target nucleic acids are NOT DETECTED. The SARS-CoV-2 RNA is generally detectable in upper respiratoy specimens during the acute phase of infection. The lowest concentration of SARS-CoV-2 viral copies this assay can detect is 131 copies/mL. A negative result does not preclude SARS-Cov-2 infection and should not be used as the sole basis for treatment or other patient management decisions. A negative result may occur with  improper specimen collection/handling, submission of specimen other than nasopharyngeal swab, presence of viral mutation(s) within the areas targeted by this assay, and inadequate number of viral copies (<131 copies/mL). A negative result must be combined with clinical observations, patient history, and epidemiological information. The expected result is Negative. Fact Sheet for Patients:  PinkCheek.be Fact Sheet for Healthcare Providers:  GravelBags.it This test is not yet ap proved or cleared by the [place] FDA and  has been  authorized for detection and/or diagnosis of SARS-CoV-2 by FDA under an Emergency Use Authorization (EUA). This EUA will remain  in effect (meaning this test can be used) for the duration of the COVID-19 declaration under Section 564(b)(1) of the Act, 21 U.S.C. section 360bbb-3(b)(1), unless the authorization is terminated or revoked sooner.    Influenza A by PCR NEGATIVE NEGATIVE Final   Influenza B by PCR NEGATIVE NEGATIVE Final    Comment: (NOTE) The Xpert Xpress SARS-CoV-2/FLU/RSV assay is intended as an aid in  the diagnosis of influenza from Nasopharyngeal swab specimens and  should not be used as a sole basis for treatment. Nasal washings and  aspirates are unacceptable for Xpert Xpress SARS-CoV-2/FLU/RSV  testing. Fact Sheet for Patients: PinkCheek.be Fact Sheet for Healthcare Providers: GravelBags.it This test is not yet approved or cleared by the [place] FDA and  has been authorized for detection and/or diagnosis of SARS-CoV-2 by  FDA under an Emergency Use Authorization (EUA). This EUA will remain  in effect (meaning this test can be used) for the duration of the  Covid-19 declaration under Section 564(b)(1) of the Act, 21  U.S.C. section 360bbb-3(b)(1), unless the authorization is  terminated or revoked. Performed at [facility], [address] [place], [place] [address]   MRSA PCR Screening     Status: None   Collection Time: 07/26/19 11:00 PM   Specimen: Nasal Mucosa; Nasopharyngeal  Result Value Ref Range Status   MRSA by PCR NEGATIVE NEGATIVE Final  Comment:        The GeneXpert MRSA Assay (FDA approved for NASAL specimens only), is one component of a comprehensive MRSA colonization surveillance program. It is not intended to diagnose MRSA infection nor to guide or monitor treatment for MRSA infections. Performed at [facility], [address], [place], [place] [address]      Labs: BNP  (last 3 results) Recent Labs    11/26/18 1036  BNP [id number]*   Basic Metabolic Panel: Recent Labs  Lab 07/26/19 1412 07/27/19 0502 07/28/19 0353  NA 136 136 137  K 3.6 5.0 4.4  CL 100 100 104  CO2 22 21* 25  GLUCOSE 204* 275* 97  BUN 21 36* 44*  CREATININE 1.34* 2.04* 1.62*  CALCIUM 8.4* 8.3* 8.6*  MG  --   --  2.2   Liver Function Tests: Recent Labs  Lab 07/27/19 0502  AST 24  ALT 15  ALKPHOS 49  BILITOT 0.8  PROT 6.3*  ALBUMIN 3.8   No results for input(s): LIPASE, AMYLASE in the last 168 hours. No results for input(s): AMMONIA in the last 168 hours. CBC: Recent Labs  Lab 07/26/19 1412 07/27/19 0502 07/28/19 0353  WBC 2.5* 10.5 8.9  NEUTROABS 0.3*  --  6.2  HGB 11.7* 8.7* 7.7*  HCT 40.0 28.8* 25.7*  MCV 81.0 81.4 79.3*  PLT 151 169 166   Cardiac Enzymes: No results for input(s): CKTOTAL, CKMB, CKMBINDEX, TROPONINI in the last 168 hours. BNP: Invalid input(s): POCBNP CBG: Recent Labs  Lab 07/26/19 1327 07/26/19 2000  GLUCAP 179* 242*   D-Dimer No results for input(s): DDIMER in the last 72 hours. Hgb A1c Recent Labs    07/26/19 1412  HGBA1C 6.7*   Lipid Profile No results for input(s): CHOL, HDL, LDLCALC, TRIG, CHOLHDL, LDLDIRECT in the last 72 hours. Thyroid function studies No results for input(s): TSH, T4TOTAL, T3FREE, THYROIDAB in the last 72 hours.  Invalid input(s): FREET3 Anemia work up No results for input(s): VITAMINB12, FOLATE, FERRITIN, TIBC, IRON, RETICCTPCT in the last 72 hours. Urinalysis    Component Value Date/Time   COLORURINE YELLOW 12/13/2017 2043   APPEARANCEUR CLEAR 12/13/2017 2043   LABSPEC 1.014 12/13/2017 2043   PHURINE 5.0 12/13/2017 2043   GLUCOSEU NEGATIVE 12/13/2017 2043   HGBUR NEGATIVE 12/13/2017 2043   BILIRUBINUR NEGATIVE 12/13/2017 2043   [place] NEGATIVE 12/13/2017 2043   PROTEINUR NEGATIVE 12/13/2017 2043   NITRITE NEGATIVE 12/13/2017 2043   LEUKOCYTESUR NEGATIVE 12/13/2017 2043   Sepsis  Labs Invalid input(s): PROCALCITONIN,  WBC,  LACTICIDVEN Microbiology Recent Results (from the past 240 hour(s))  Respiratory Panel by RT PCR (Flu A&B, Covid) - Nasopharyngeal Swab     Status: None   Collection Time: 07/26/19  3:04 PM   Specimen: Nasopharyngeal Swab  Result Value Ref Range Status   SARS Coronavirus 2 by RT PCR NEGATIVE NEGATIVE Final    Comment: (NOTE) SARS-CoV-2 target nucleic acids are NOT DETECTED. The SARS-CoV-2 RNA is generally detectable in upper respiratoy specimens during the acute phase of infection. The lowest concentration of SARS-CoV-2 viral copies this assay can detect is 131 copies/mL. A negative result does not preclude SARS-Cov-2 infection and should not be used as the sole basis for treatment or other patient management decisions. A negative result may occur with  improper specimen collection/handling, submission of specimen other than nasopharyngeal swab, presence of viral mutation(s) within the areas targeted by this assay, and inadequate number of viral copies (<131 copies/mL). A negative result must be  combined with clinical observations, patient history, and epidemiological information. The expected result is Negative. Fact Sheet for Patients:  PinkCheek.be Fact Sheet for Healthcare Providers:  GravelBags.it This test is not yet ap proved or cleared by the [place] FDA and  has been authorized for detection and/or diagnosis of SARS-CoV-2 by FDA under an Emergency Use Authorization (EUA). This EUA will remain  in effect (meaning this test can be used) for the duration of the COVID-19 declaration under Section 564(b)(1) of the Act, 21 U.S.C. section 360bbb-3(b)(1), unless the authorization is terminated or revoked sooner.    Influenza A by PCR NEGATIVE NEGATIVE Final   Influenza B by PCR NEGATIVE NEGATIVE Final    Comment: (NOTE) The Xpert Xpress SARS-CoV-2/FLU/RSV assay is  intended as an aid in  the diagnosis of influenza from Nasopharyngeal swab specimens and  should not be used as a sole basis for treatment. Nasal washings and  aspirates are unacceptable for Xpert Xpress SARS-CoV-2/FLU/RSV  testing. Fact Sheet for Patients: PinkCheek.be Fact Sheet for Healthcare Providers: GravelBags.it This test is not yet approved or cleared by the [place] FDA and  has been authorized for detection and/or diagnosis of SARS-CoV-2 by  FDA under an Emergency Use Authorization (EUA). This EUA will remain  in effect (meaning this test can be used) for the duration of the  Covid-19 declaration under Section 564(b)(1) of the Act, 21  U.S.C. section 360bbb-3(b)(1), unless the authorization is  terminated or revoked. Performed at [facility], [address], [place], [place] [address]   MRSA PCR Screening     Status: None   Collection Time: 07/26/19 11:00 PM   Specimen: Nasal Mucosa; Nasopharyngeal  Result Value Ref Range Status   MRSA by PCR NEGATIVE NEGATIVE Final    Comment:        The GeneXpert MRSA Assay (FDA approved for NASAL specimens only), is one component of a comprehensive MRSA colonization surveillance program. It is not intended to diagnose MRSA infection nor to guide or monitor treatment for MRSA infections. Performed at [facility], [address] [place], [place] [address]    Time coordinating discharge: 32 mins  SIGNED:  Irwin Brakeman, MD  [facility] 07/28/2019, 12:45 PM How to contact the [facility] Attending or Consulting provider [place] or covering provider during after hours [place], for this patient?  1. Check the care team in [facility] and look for a) attending/consulting [facility] provider listed and b) the [facility] team listed 2. Log into [email or web address] and use [place]'s universal password to access. If you do not have the password, please contact the hospital operator. 3. Locate the  [facility] provider you are looking for under [facility] and page to a number that you can be directly reached. 4. If you still have difficulty reaching the provider, please page the [facility] (Director on Call) for the Hospitalists listed on amion for assistance.

## 2019-07-28 NOTE — Evaluation (Signed)
Physical Therapy Evaluation Patient Details Name: Mike Wright MRN: [id number] DOB: September 15, 1933 Today's Date: 07/28/2019   History of Present Illness  86 year old gentleman with coronary artery disease status post stent placement chronic chest pain A. fib on Eliquis, GERD, diabetes, OSA, COPD with chronic hypoxemia, chronic diastolic heart failure and peripheral vascular disease was admitted from the infusion center after having a reaction to a Feraheme infusion.  [facility] was called and patient was hypotensive but never lost pulse.  He was given 2 doses of epinephrine and stabilized and then transferred to the ICU.    Clinical Impression  Patient evaluated by Physical Therapy with no further acute PT needs identified. All education has been completed and the patient has no further questions. Patient exhibits decreased balance with dynamic activities and has a history of falling. PT recommends PT services, whether HH or OPPT to improve patient's safety, decrease risk for falls and improved functional abilities. Patient would benefit from ambulation with nursing while in the acute setting. See below for any follow-up Physical Therapy or equipment needs. PT is signing off. Thank you for this referral.     Follow Up Recommendations Home health PT;Supervision for mobility/OOB    Equipment Recommendations  None recommended by PT    Recommendations for Other Services       Precautions / Restrictions Precautions Precautions: Fall Precaution Comments: fell 1x within last 6 months when caught right toe and right leg "collapsed." Restrictions Weight Bearing Restrictions: No      Mobility  Bed Mobility Overal bed mobility: Modified Independent             General bed mobility comments: increased time  Transfers Overall transfer level: Needs assistance Equipment used: Straight cane Transfers: Sit to/from Stand;Stand Pivot Transfers Sit to Stand: Supervision Stand pivot transfers:  Supervision       General transfer comment: increased time, history of falling, decreased sensation of right foot reported  Ambulation/Gait Ambulation/Gait assistance: Supervision;Min guard Gait Distance (Feet): 250 Feet Assistive device: Straight cane Gait Pattern/deviations: Step-through pattern;Decreased step length - right;Decreased step length - left;Decreased stride length;Decreased dorsiflexion - right Gait velocity: decreased   General Gait Details: SPC in left hand, drop foot noted on right, history of left and right toe amputations, slow, steady gait, somewhat limited by fatigue, minor variations in path when attempting to converse with therapist during ambulation.  Stairs            Wheelchair Mobility    Modified Rankin (Stroke Patients Only)       Balance Overall balance assessment: Needs assistance Sitting-balance support: No upper extremity supported;Feet supported Sitting balance-Leahy Scale: Good     Standing balance support: Single extremity supported;During functional activity Standing balance-Leahy Scale: Fair Standing balance comment: fair with SPC                             Pertinent Vitals/Pain Pain Assessment: No/denies pain    Home Living Family/patient expects to be discharged to:: Private residence Living Arrangements: Spouse/significant other Available Help at Discharge: Family;Available 24 hours/day Type of Home: House Home Access: Stairs to enter Entrance Stairs-Rails: Can reach both;Left;Right Entrance Stairs-Number of Steps: 5 Home Layout: One level Home Equipment: Hand held shower head;Grab bars - tub/shower;Grab bars - toilet;Bedside commode;Walker - 4 wheels;Cane - single point;Shower seat - built in;Transport chair;Wheelchair - manual      Prior Function Level of Independence: Independent with assistive device(s)  Comments: uses SPC for household distances; uses cart at store; continues to drive.      Hand Dominance        Extremity/Trunk Assessment   Upper Extremity Assessment Upper Extremity Assessment: Generalized weakness    Lower Extremity Assessment Lower Extremity Assessment: Generalized weakness    Cervical / Trunk Assessment Cervical / Trunk Assessment: Normal  Communication   Communication: No difficulties  Cognition Arousal/Alertness: Awake/alert Behavior During Therapy: WFL for tasks assessed/performed Overall Cognitive Status: Within Functional Limits for tasks assessed                                        General Comments      Exercises     Assessment/Plan    PT Assessment All further PT needs can be met in the next venue of care  PT Problem List Decreased strength;Decreased activity tolerance;Decreased balance       PT Treatment Interventions      PT Goals (Current goals can be found in the Care Plan section)  Acute Rehab PT Goals Patient Stated Goal: Home with HHPT by [facility]. PT Goal Formulation: With patient/family Time For Goal Achievement: 08/11/19 Potential to Achieve Goals: Good    Frequency     Barriers to discharge        Co-evaluation               AM-PAC PT "6 Clicks" Mobility  Outcome Measure Help needed turning from your back to your side while in a flat bed without using bedrails?: None Help needed moving from lying on your back to sitting on the side of a flat bed without using bedrails?: A Little Help needed moving to and from a bed to a chair (including a wheelchair)?: A Little Help needed standing up from a chair using your arms (e.g., wheelchair or bedside chair)?: A Little Help needed to walk in hospital room?: A Little Help needed climbing 3-5 steps with a railing? : A Little 6 Click Score: 19    End of Session Equipment Utilized During Treatment: Gait belt Activity Tolerance: Patient tolerated treatment well;Patient limited by fatigue Patient left: in bed;with nursing/sitter in  room;with family/visitor present Nurse Communication: Mobility status PT Visit Diagnosis: Unsteadiness on feet (R26.81);History of falling (Z91.81);Other abnormalities of gait and mobility (R26.89);Muscle weakness (generalized) (M62.81)    Time: 1300-1330 PT Time Calculation (min) (ACUTE ONLY): 30 min   Charges:   PT Evaluation $PT Eval Low Complexity: 1 Low PT Treatments $Gait Training: 8-22 mins        Floria Raveling. Hartnett-Rands, MS, PT Per [place] [phone] 07/28/2019, 1:30 PM

## 2019-07-28 NOTE — TOC Transition Note (Signed)
Transition of Care [facility]) - CM/SW Discharge Note   Patient Details  Name: Mike Wright MRN: [id number] Date of Birth: [birth year]  Transition of Care [facility]) CM/SW Contact:  Shade Flood, LCSW Phone Number: 07/28/2019, 12:49 PM   Clinical Narrative:     Pt stable for dc per MD. MD does not feel pt requires HH at this time. No TOC needs for dc.  Final next level of care: Home/Self Care Barriers to Discharge: Barriers Resolved   Patient Goals and CMS Choice        Discharge Placement                       Discharge Plan and Services                                     Social Determinants of Health (SDOH) Interventions     Readmission Risk Interventions Readmission Risk Prevention Plan 07/27/2019  Transportation Screening Complete  Palliative Care Screening Not Applicable  Medication Review (RN Care Manager) Complete  Some recent data might be hidden

## 2019-07-28 NOTE — Discharge Instructions (Signed)
PLEASE FOLLOW UP WITH UROLOGIST IN 1-2 WEEKS TO HAVE  YOUR FOLEY CATHETER REMOVED AND HAVE A VOIDING TRIAL DONE.  PLEASE CALL [facility] [place] OFFICE TO SCHEDULE.     IMPORTANT INFORMATION: PAY CLOSE ATTENTION   PHYSICIAN DISCHARGE INSTRUCTIONS  Follow with Primary care provider  Monico Blitz, MD  and other consultants as instructed by your Hospitalist Physician  [place] IF SYMPTOMS COME BACK, WORSEN OR NEW PROBLEM DEVELOPS   Please note: You were cared for by a hospitalist during your hospital stay. Every effort will be made to forward records to your primary care provider.  You can request that your primary care provider send for your hospital records if they have not received them.  Once you are discharged, your primary care physician will handle any further medical issues. Please note that NO REFILLS for any discharge medications will be authorized once you are discharged, as it is imperative that you return to your primary care physician (or establish a relationship with a primary care physician if you do not have one) for your post hospital discharge needs so that they can reassess your need for medications and monitor your lab values.  Please get a complete blood count and chemistry panel checked by your Primary MD at your next visit, and again as instructed by your Primary MD.  Get Medicines reviewed and adjusted: Please take all your medications with you for your next visit with your Primary MD  Laboratory/radiological data: Please request your Primary MD to go over all hospital tests and procedure/radiological results at the follow up, please ask your primary care provider to get all Hospital records sent to his/her office.  In some cases, they will be blood work, cultures and biopsy results pending at the time of your discharge. Please request that your primary care provider follow up on these results.  If you are diabetic, please  bring your blood sugar readings with you to your follow up appointment with primary care.    Please call and make your follow up appointments as soon as possible.    Also Note the following: If you experience worsening of your admission symptoms, develop shortness of breath, life threatening emergency, suicidal or homicidal thoughts you must seek medical attention immediately by calling 911 or calling your MD immediately  if symptoms less severe.  You must read complete instructions/literature along with all the possible adverse reactions/side effects for all the Medicines you take and that have been prescribed to you. Take any new Medicines after you have completely understood and accpet all the possible adverse reactions/side effects.   Do not drive when taking Pain medications or sleeping medications (Benzodiazepines)  Do not take more than prescribed Pain, Sleep and Anxiety Medications. It is not advisable to combine anxiety,sleep and pain medications without talking with your primary care practitioner  Special Instructions: If you have smoked or chewed Tobacco  in the last 2 yrs please stop smoking, stop any regular Alcohol  and or any Recreational drug use.  Wear Seat belts while driving.  Do not drive if taking any narcotic, mind altering or controlled substances or recreational drugs or alcohol.

## 2019-07-29 ENCOUNTER — Other Ambulatory Visit: Payer: Self-pay

## 2019-07-29 LAB — GLUCOSE, CAPILLARY
Glucose-Capillary: 244 mg/dL — ABNORMAL HIGH (ref 70–99)
Glucose-Capillary: 291 mg/dL — ABNORMAL HIGH (ref 70–99)
Glucose-Capillary: 207 mg/dL — ABNORMAL HIGH (ref 70–99)
Glucose-Capillary: 210 mg/dL — ABNORMAL HIGH (ref 70–99)
Glucose-Capillary: 203 mg/dL — ABNORMAL HIGH (ref 70–99)
Glucose-Capillary: 114 mg/dL — ABNORMAL HIGH (ref 70–99)
Glucose-Capillary: 95 mg/dL (ref 70–99)
Glucose-Capillary: 152 mg/dL — ABNORMAL HIGH (ref 70–99)

## 2019-07-29 LAB — TYPE AND SCREEN
ABO/RH(D): O POS
Antibody Screen: NEGATIVE
Unit division: 0

## 2019-07-29 LAB — BPAM RBC
Blood Product Expiration Date: 202105282359
ISSUE DATE / TIME: 202105020929
Unit Type and Rh: 5100

## 2019-07-29 NOTE — Patient Outreach (Signed)
[facility]) Care Management  [place]  07/29/2019   CORA ZAPANTA 02/21/1934 [id number]  Subjective: Telephone call to patient for follow after hospitalization. Patient recently admitted during iron infusion for anaphylactic reaction to transfusion. Patient states he is doing much better.  He states he can no longer take iron or iron infusion due to reaction.  He states he had a blood transfusion on yesterday before leaving the hospital.  He states he feels better.  Discussed with patient anaphylactic reactions and it is importance to call 911.  He verbalized understanding.  He states that he has an appointment with Dr. Laural Golden within the next 3 weeks to discuss further treatment options for his low hemoglobins.  Patient also states he has other follow up appointments as well coming up. He denies any problems with transportation.  Patient reports his weight is stable and blood sugars are good too.  He voices no concerns and is appreciative of follow up call.    Objective:   Encounter Medications:  Outpatient Encounter Medications as of 07/29/2019  Medication Sig Note  . acetaminophen (TYLENOL) 500 MG tablet Take 1 tablet (500 mg total) by mouth every 6 (six) hours as needed for mild pain or headache.   [name] apixaban (ELIQUIS) 2.5 MG TABS tablet Take 1 tablet (2.5 mg total) by mouth 2 (two) times daily.   [name] atorvastatin (LIPITOR) 80 MG tablet Take 80 mg by mouth every evening.    . Cholecalciferol (VITAMIN D) 50 MCG (2000 UT) tablet Take 2,000 Units by mouth daily.   . clopidogrel (PLAVIX) 75 MG tablet Take 75 mg by mouth daily.   . Cyanocobalamin (B-12) 2500 MCG TABS Take 2,500 mcg by mouth daily.   . finasteride (PROSCAR) 5 MG tablet Take 1 tablet (5 mg total) by mouth daily.   . folic acid (FOLVITE) 1 MG tablet Take 1 tablet (1 mg total) by mouth daily.   . furosemide (LASIX) 40 MG tablet Take 40 mg by mouth 2 (two) times daily.    . insulin glargine (LANTUS) 100  UNIT/ML injection Inject 30-40 Units into the skin See admin instructions. Inject 30 units SQ in the morning and inject 40 units SQ at bedtime 07/26/2019: 30 units at 0900 07/26/19  . isosorbide mononitrate (IMDUR) 30 MG 24 hr tablet TAKE 1/2 TABLET EVERY DAY (Patient taking differently: Take 15 mg by mouth daily. )   . lisinopril (ZESTRIL) 5 MG tablet Take 0.5 tablets (2.5 mg total) by mouth daily. (Patient taking differently: Take 5 mg by mouth daily. )   . meclizine (ANTIVERT) 25 MG tablet Take 25 mg by mouth daily as needed for dizziness.   . metFORMIN (GLUCOPHAGE) 1000 MG tablet Take 1,000 mg by mouth 2 (two) times daily.   . nitroGLYCERIN (NITROSTAT) 0.4 MG SL tablet PLACE 1 TABLET (0.4 MG TOTAL) UNDER THE TONGUE EVERY 5  MINUTES AS NEEDED FOR CHEST PAIN. (Patient taking differently: Place 0.4 mg under the tongue every 5 (five) minutes as needed for chest pain. ) 07/26/2019: Took 1 tab yesterday morning  . pantoprazole (PROTONIX) 40 MG tablet Take 1 tablet (40 mg total) by mouth 2 (two) times daily. To protect stomach while taking multiple blood thinners.   . Pediatric Multivitamins-Iron (FLINTSTONES COMPLETE) 18 MG CHEW Chew 1 tablet by mouth 2 (two) times daily. (Patient taking differently: Chew 1 tablet by mouth daily. )   . potassium chloride SA (K-DUR) 20 MEQ tablet Take 1 tablet (20 mEq total) by  mouth daily.   . psyllium (METAMUCIL) 58.6 % packet Take 0.5 packets by mouth 2 (two) times daily.    . traZODone (DESYREL) 100 MG tablet Take 100 mg by mouth at bedtime.     No facility-administered encounter medications on file as of 07/29/2019.    Functional Status:  In your present state of health, do you have any difficulty performing the following activities: 07/29/2019 07/26/2019  Hearing? Y Y  Comment some difficulty hearing -  Vision? 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 N N  Walking or climbing stairs? Y Y  Comment weakness -  Dressing or  bathing? N N  Doing errands, shopping? N N  Preparing Food and eating ? N -  Using the Toilet? N -  In the past six months, have you accidently leaked urine? Y -  Comment wears depends -  Do you have problems with loss of bowel control? N -  Managing your Medications? N -  Managing your Finances? N -  Housekeeping or managing your Housekeeping? Y -  Comment wife assists -  Some recent data might be hidden    Fall/Depression Screening: Fall Risk  07/29/2019 06/12/2019 04/23/2019  Falls in the past year? 0 0 0  Number falls in past yr: - - -  Injury with Fall? - - -  Risk for fall due to : - - -  Follow up - - -   PHQ 2/9 Scores 07/29/2019 06/12/2019 12/20/2018  PHQ - 2 Score 0 0 0   SDOH Screenings   Alcohol Screen:   . Last Alcohol Screening Score (AUDIT):   Depression (PHQ2-9): Low Risk   . PHQ-2 Score: 0  Financial Resource Strain: Medium Risk  . Difficulty of Paying Living Expenses: Somewhat hard  Food Insecurity: No Food Insecurity  . Worried About Charity fundraiser in the Last Year: Never true  . Ran Out of Food in the Last Year: Never true  Housing: Low Risk   . Last Housing Risk Score: 0  Physical Activity:   . Days of Exercise per Week:   . Minutes of Exercise per Session:   Social Connections:   . Frequency of Communication with Friends and Family:   . Frequency of Social Gatherings with Friends and Family:   . Attends Religious Services:   . Active Member of Clubs or Organizations:   . Attends Archivist Meetings:   [name] Marital Status:   Stress: No Stress Concern Present  . Feeling of Stress : Not at all  Tobacco Use: Medium Risk  . Smoking Tobacco Use: Former Smoker  . Smokeless Tobacco Use: Never Used  Transportation Needs: No Transportation Needs  . Lack of Transportation (Medical): No  . Lack of Transportation (Non-Medical): No    Assessment: Patient with recent hospitalization for anaphylaxis after reaction during iron infusion.  Patient  recovering well per patient.   Plan:  [facility] CM Care Plan Problem One     Most Recent Value  Care Plan Problem One  Chronic Heart Failure  Role Documenting the Problem One  Care Management Telephonic Coordinator  Care Plan for Problem One  Active  THN Long Term Goal   Over the next 90 days, patient will demonstrate and/or verbalize understanding of self-health management for long term care of CHF .  THN Long Term Goal Start Date  07/29/19  Interventions for Problem One Long Term Goal  Patient continues to check weights  regularly.  Patient able to describe signs of heart failure    [facility] CM Care Plan Problem Two     Most Recent Value  Care Plan Problem Two  Recent hospitalization for anaphylaxis  Role Documenting the Problem Two  Care Management Telephonic Coordinator  Care Plan for Problem Two  Active  Interventions for Problem Two Long Term Goal   Discussed with patient current clinical condition since hospital discharge, confirmed that patient has all medications and denies concerns around medications, reviewed post-hospital discharge instructions with patient and confirmed that patient has reliable transportation to all scheduled provider appointments post-hospital discharge.  THN Long Term Goal  Patient will not readmit within the next 31 days  THN Long Term Goal Start Date  07/29/19  [facility] CM Short Term Goal #1   Patient will follow up with GI physician to discuss further treatment within 21 days.    [facility]N CM Short Term Goal #1 Start Date  07/29/19  Interventions for Short Term Goal #2   Patient reports appointment with GI doctor to discussed treatment options for low hemoglobin.   Discussed with patient transportation to appointments.     THN CM Short Term Goal #2   Patient will be able to recognize signs of anaphylaxis within 21 days.   THN CM Short Term Goal #2 Start Date  07/29/19  Interventions for Short Term Goal #2  RN CM discussed with patient signs of anaphylaxis such as sudden  shortness of breath, throat swelling, rash, nausea, and vomiting.  Advised patient on seeking help immediately.       RN CM will send update to PCP RN CM will contact patient again in the month of May.    [name], RN, MSN [place] [facility] Care Management Coordinator Direct Line [phone] Cell [phone] Toll Free: [phone]  Fax: [phone]

## 2019-08-01 DIAGNOSIS — D649 Anemia, unspecified: Secondary | ICD-10-CM | POA: Diagnosis not present

## 2019-08-05 ENCOUNTER — Other Ambulatory Visit: Payer: Self-pay

## 2019-08-05 ENCOUNTER — Encounter (INDEPENDENT_AMBULATORY_CARE_PROVIDER_SITE_OTHER): Payer: Medicare HMO | Admitting: Ophthalmology

## 2019-08-05 DIAGNOSIS — I10 Essential (primary) hypertension: Secondary | ICD-10-CM

## 2019-08-05 DIAGNOSIS — E11319 Type 2 diabetes mellitus with unspecified diabetic retinopathy without macular edema: Secondary | ICD-10-CM

## 2019-08-05 DIAGNOSIS — H43813 Vitreous degeneration, bilateral: Secondary | ICD-10-CM

## 2019-08-05 DIAGNOSIS — E113293 Type 2 diabetes mellitus with mild nonproliferative diabetic retinopathy without macular edema, bilateral: Secondary | ICD-10-CM

## 2019-08-05 DIAGNOSIS — H353122 Nonexudative age-related macular degeneration, left eye, intermediate dry stage: Secondary | ICD-10-CM

## 2019-08-05 DIAGNOSIS — H353111 Nonexudative age-related macular degeneration, right eye, early dry stage: Secondary | ICD-10-CM

## 2019-08-05 DIAGNOSIS — H35033 Hypertensive retinopathy, bilateral: Secondary | ICD-10-CM

## 2019-08-07 ENCOUNTER — Other Ambulatory Visit: Payer: Self-pay | Admitting: Cardiology

## 2019-08-09 DIAGNOSIS — E1129 Type 2 diabetes mellitus with other diabetic kidney complication: Secondary | ICD-10-CM | POA: Diagnosis not present

## 2019-08-09 DIAGNOSIS — E1165 Type 2 diabetes mellitus with hyperglycemia: Secondary | ICD-10-CM | POA: Diagnosis not present

## 2019-08-09 DIAGNOSIS — Z299 Encounter for prophylactic measures, unspecified: Secondary | ICD-10-CM | POA: Diagnosis not present

## 2019-08-09 DIAGNOSIS — I10 Essential (primary) hypertension: Secondary | ICD-10-CM | POA: Diagnosis not present

## 2019-08-09 DIAGNOSIS — E1151 Type 2 diabetes mellitus with diabetic peripheral angiopathy without gangrene: Secondary | ICD-10-CM | POA: Diagnosis not present

## 2019-08-09 DIAGNOSIS — R809 Proteinuria, unspecified: Secondary | ICD-10-CM | POA: Diagnosis not present

## 2019-08-09 DIAGNOSIS — I739 Peripheral vascular disease, unspecified: Secondary | ICD-10-CM | POA: Diagnosis not present

## 2019-08-09 DIAGNOSIS — Z6828 Body mass index (BMI) 28.0-28.9, adult: Secondary | ICD-10-CM | POA: Diagnosis not present

## 2019-08-14 ENCOUNTER — Other Ambulatory Visit: Payer: Self-pay

## 2019-08-14 NOTE — Patient Outreach (Signed)
[facility]) Care Management  [place]  08/14/2019   CASHEL BELLINA 10-17-1933 [id number]  Subjective: Telephone call to patient for follow up. Patient is doing good. He reports that he has had no problems since anaphylaxis in the hospital. Patient continues to monitor weights.  Patient states weight right around 185 lbs.  Encouraged patient to continue with heart failure management.  He verbalized understanding. Patient continues to manage diabetes as well with morning sugars less than 100 per patient.  Encouraged patient to continue regimen. Patient denies any questions at this time.     Objective:   Encounter Medications:  Outpatient Encounter Medications as of 08/14/2019  Medication Sig Note  . acetaminophen (TYLENOL) 500 MG tablet Take 1 tablet (500 mg total) by mouth every 6 (six) hours as needed for mild pain or headache.   [name] apixaban (ELIQUIS) 2.5 MG TABS tablet Take 1 tablet (2.5 mg total) by mouth 2 (two) times daily.   [name] atorvastatin (LIPITOR) 80 MG tablet Take 80 mg by mouth every evening.    . Cholecalciferol (VITAMIN D) 50 MCG (2000 UT) tablet Take 2,000 Units by mouth daily.   . clopidogrel (PLAVIX) 75 MG tablet Take 75 mg by mouth daily.   . Cyanocobalamin (B-12) 2500 MCG TABS Take 2,500 mcg by mouth daily.   . finasteride (PROSCAR) 5 MG tablet Take 1 tablet (5 mg total) by mouth daily.   . folic acid (FOLVITE) 1 MG tablet Take 1 tablet (1 mg total) by mouth daily.   . furosemide (LASIX) 40 MG tablet TAKE 1 TABLET DAILY - MAY TAKE AN EXTRA 20 MG AS NEEDED (DOSE CHANGE 04/01/19)   . insulin glargine (LANTUS) 100 UNIT/ML injection Inject 30-40 Units into the skin See admin instructions. Inject 30 units SQ in the morning and inject 40 units SQ at bedtime 07/26/2019: 30 units at 0900 07/26/19  . isosorbide mononitrate (IMDUR) 30 MG 24 hr tablet TAKE 1/2 TABLET EVERY DAY (Patient taking differently: Take 15 mg by mouth daily. )   . lisinopril (ZESTRIL) 5 MG  tablet Take 0.5 tablets (2.5 mg total) by mouth daily. (Patient taking differently: Take 5 mg by mouth daily. )   . meclizine (ANTIVERT) 25 MG tablet Take 25 mg by mouth daily as needed for dizziness.   . metFORMIN (GLUCOPHAGE) 1000 MG tablet Take 1,000 mg by mouth 2 (two) times daily.   . nitroGLYCERIN (NITROSTAT) 0.4 MG SL tablet PLACE 1 TABLET (0.4 MG TOTAL) UNDER THE TONGUE EVERY 5  MINUTES AS NEEDED FOR CHEST PAIN. (Patient taking differently: Place 0.4 mg under the tongue every 5 (five) minutes as needed for chest pain. ) 07/26/2019: Took 1 tab yesterday morning  . pantoprazole (PROTONIX) 40 MG tablet Take 1 tablet (40 mg total) by mouth 2 (two) times daily. To protect stomach while taking multiple blood thinners.   . Pediatric Multivitamins-Iron (FLINTSTONES COMPLETE) 18 MG CHEW Chew 1 tablet by mouth 2 (two) times daily. (Patient taking differently: Chew 1 tablet by mouth daily. )   . potassium chloride SA (K-DUR) 20 MEQ tablet Take 1 tablet (20 mEq total) by mouth daily.   . psyllium (METAMUCIL) 58.6 % packet Take 0.5 packets by mouth 2 (two) times daily.    . traZODone (DESYREL) 100 MG tablet Take 100 mg by mouth at bedtime.     No facility-administered encounter medications on file as of 08/14/2019.    Functional Status:  In your present state of health, do you have  any difficulty performing the following activities: 07/29/2019 07/26/2019  Hearing? Y Y  Comment some difficulty hearing -  Vision? 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 N N  Walking or climbing stairs? Y Y  Comment weakness -  Dressing or bathing? N N  Doing errands, shopping? N N  Preparing Food and eating ? N -  Using the Toilet? N -  In the past six months, have you accidently leaked urine? Y -  Comment wears depends -  Do you have problems with loss of bowel control? N -  Managing your Medications? N -  Managing your Finances? N -  Housekeeping or managing your  Housekeeping? Y -  Comment wife assists -  Some recent data might be hidden    Fall/Depression Screening: Fall Risk  08/14/2019 07/29/2019 06/12/2019  Falls in the past year? 1 0 0  Comment week ago in the yard no injuries - -  Number falls in past yr: 0 - -  Injury with Fall? 0 - -  Risk for fall due to : - - -  Follow up Falls prevention discussed - -   PHQ 2/9 Scores 07/29/2019 06/12/2019 12/20/2018  PHQ - 2 Score 0 0 0    Assessment: Patient has had no problems since anaphylaxis with iron infusion. Patient to follow up wit GI physician.     Plan:  [facility] CM Care Plan Problem One     Most Recent Value  Care Plan Problem One  Chronic Heart Failure  Role Documenting the Problem One  Care Management Telephonic Coordinator  Care Plan for Problem One  Active  [facility] Long Term Goal   Over the next 90 days, patient will demonstrate and/or verbalize understanding of self-health management for long term care of CHF .  [facility] Long Term Goal Start Date  07/29/19  Interventions for Problem One Long Term Goal  Patient weighing daily. Watching salt intake.  Patient knows to notify physician for weight changes.      [facility] CM Care Plan Problem Two     Most Recent Value  Care Plan Problem Two  Recent hospitalization for anaphylaxis  Role Documenting the Problem Two  Care Management Telephonic Coordinator  Care Plan for Problem Two  Active  Interventions for Problem Two Long Term Goal   Patient toscheduled to follow up with Dr. Laural Golden.    [facility] Long Term Goal  Patient will not readmit within the next 31 days  [facility] Long Term Goal Start Date  07/29/19  [facility] CM Short Term Goal #1   Patient will follow up with GI physician to discuss further treatment within 21 days.    [facility] CM Short Term Goal #1 Start Date  07/29/19  Interventions for Short Term Goal #2   Patient has appointment scheduled with GI physician this month.  Denies any blood in stool or visible blood loss.    [facility] CM Short Term Goal #2   Patient will be  able to recognize signs of anaphylaxis within 21 days.   [facility] CM Short Term Goal #2 Start Date  07/29/19  [facility] CM Short Term Goal #2 Met Date  08/14/19     RN CM will outreach patient again in the month of June and patient agreeable.    [name], RN, MSN [place] [facility] Care Management Coordinator Direct Line [phone] Cell [phone] Toll Free: [phone]  Fax: [phone]

## 2019-08-28 DIAGNOSIS — E785 Hyperlipidemia, unspecified: Secondary | ICD-10-CM | POA: Diagnosis not present

## 2019-08-28 DIAGNOSIS — E261 Secondary hyperaldosteronism: Secondary | ICD-10-CM | POA: Diagnosis not present

## 2019-08-28 DIAGNOSIS — I4891 Unspecified atrial fibrillation: Secondary | ICD-10-CM | POA: Diagnosis not present

## 2019-08-28 DIAGNOSIS — T465X5D Adverse effect of other antihypertensive drugs, subsequent encounter: Secondary | ICD-10-CM | POA: Diagnosis not present

## 2019-08-28 DIAGNOSIS — M0600 Rheumatoid arthritis without rheumatoid factor, unspecified site: Secondary | ICD-10-CM | POA: Diagnosis not present

## 2019-08-28 DIAGNOSIS — E1142 Type 2 diabetes mellitus with diabetic polyneuropathy: Secondary | ICD-10-CM | POA: Diagnosis not present

## 2019-08-28 DIAGNOSIS — D6869 Other thrombophilia: Secondary | ICD-10-CM | POA: Diagnosis not present

## 2019-08-28 DIAGNOSIS — J9611 Chronic respiratory failure with hypoxia: Secondary | ICD-10-CM | POA: Diagnosis not present

## 2019-08-28 DIAGNOSIS — E1151 Type 2 diabetes mellitus with diabetic peripheral angiopathy without gangrene: Secondary | ICD-10-CM | POA: Diagnosis not present

## 2019-08-28 DIAGNOSIS — G620 Drug-induced polyneuropathy: Secondary | ICD-10-CM | POA: Diagnosis not present

## 2019-08-28 DIAGNOSIS — Z8631 Personal history of diabetic foot ulcer: Secondary | ICD-10-CM | POA: Diagnosis not present

## 2019-08-28 DIAGNOSIS — Z89412 Acquired absence of left great toe: Secondary | ICD-10-CM | POA: Diagnosis not present

## 2019-08-28 DIAGNOSIS — Z6829 Body mass index (BMI) 29.0-29.9, adult: Secondary | ICD-10-CM | POA: Diagnosis not present

## 2019-08-28 DIAGNOSIS — Z89411 Acquired absence of right great toe: Secondary | ICD-10-CM | POA: Diagnosis not present

## 2019-08-28 DIAGNOSIS — J449 Chronic obstructive pulmonary disease, unspecified: Secondary | ICD-10-CM | POA: Diagnosis not present

## 2019-08-28 DIAGNOSIS — Z7901 Long term (current) use of anticoagulants: Secondary | ICD-10-CM | POA: Diagnosis not present

## 2019-08-28 DIAGNOSIS — D692 Other nonthrombocytopenic purpura: Secondary | ICD-10-CM | POA: Diagnosis not present

## 2019-08-28 DIAGNOSIS — E1169 Type 2 diabetes mellitus with other specified complication: Secondary | ICD-10-CM | POA: Diagnosis not present

## 2019-09-11 ENCOUNTER — Telehealth: Payer: Self-pay | Admitting: Cardiology

## 2019-09-11 NOTE — Telephone Encounter (Signed)
°  Patient Consent for Virtual Visit         Mike Wright has provided verbal consent on 09/11/2019 for a virtual visit (video or telephone).   CONSENT FOR VIRTUAL VISIT FOR:  Mike Wright  By participating in this virtual visit I agree to the following:  I hereby voluntarily request, consent and authorize [facility] and its employed or contracted physicians, physician assistants, nurse practitioners or other licensed health care professionals (the Practitioner), to provide me with telemedicine health care services (the Services") as deemed necessary by the treating Practitioner. I acknowledge and consent to receive the Services by the Practitioner via telemedicine. I understand that the telemedicine visit will involve communicating with the Practitioner through live audiovisual communication technology and the disclosure of certain medical information by electronic transmission. I acknowledge that I have been given the opportunity to request an in-person assessment or other available alternative prior to the telemedicine visit and am voluntarily participating in the telemedicine visit.  I understand that I have the right to withhold or withdraw my consent to the use of telemedicine in the course of my care at any time, without affecting my right to future care or treatment, and that the Practitioner or I may terminate the telemedicine visit at any time. I understand that I have the right to inspect all information obtained and/or recorded in the course of the telemedicine visit and may receive copies of available information for a reasonable fee.  I understand that some of the potential risks of receiving the Services via telemedicine include:   Delay or interruption in medical evaluation due to technological equipment failure or disruption;  Information transmitted may not be sufficient (e.g. poor resolution of images) to allow for appropriate medical decision making by the Practitioner;  and/or   In rare instances, security protocols could fail, causing a breach of personal health information.  Furthermore, I acknowledge that it is my responsibility to provide information about my medical history, conditions and care that is complete and accurate to the best of my ability. I acknowledge that Practitioner's advice, recommendations, and/or decision may be based on factors not within their control, such as incomplete or inaccurate data provided by me or distortions of diagnostic images or specimens that may result from electronic transmissions. I understand that the practice of medicine is not an exact science and that Practitioner makes no warranties or guarantees regarding treatment outcomes. I acknowledge that a copy of this consent can be made available to me via my patient portal ([facility]), or I can request a printed copy by calling the office of [facility].    I understand that my insurance will be billed for this visit.   I have read or had this consent read to me.  I understand the contents of this consent, which adequately explains the benefits and risks of the Services being provided via telemedicine.   I have been provided ample opportunity to ask questions regarding this consent and the Services and have had my questions answered to my satisfaction.  I give my informed consent for the services to be provided through the use of telemedicine in my medical care

## 2019-09-13 ENCOUNTER — Telehealth (INDEPENDENT_AMBULATORY_CARE_PROVIDER_SITE_OTHER): Payer: Medicare HMO | Admitting: Cardiology

## 2019-09-13 ENCOUNTER — Encounter: Payer: Self-pay | Admitting: Cardiology

## 2019-09-13 ENCOUNTER — Other Ambulatory Visit: Payer: Self-pay

## 2019-09-13 VITALS — BP 147/68 | HR 72 | Ht 67.0 in | Wt 187.0 lb

## 2019-09-13 DIAGNOSIS — I2510 Atherosclerotic heart disease of native coronary artery without angina pectoris: Secondary | ICD-10-CM | POA: Diagnosis not present

## 2019-09-13 DIAGNOSIS — I5032 Chronic diastolic (congestive) heart failure: Secondary | ICD-10-CM

## 2019-09-13 DIAGNOSIS — I10 Essential (primary) hypertension: Secondary | ICD-10-CM

## 2019-09-13 MED ORDER — ISOSORBIDE MONONITRATE ER 30 MG PO TB24
30.0000 mg | ORAL_TABLET | Freq: Every day | ORAL | 1 refills | Status: DC
Start: 2019-09-13 — End: 2020-01-21

## 2019-09-13 NOTE — Patient Instructions (Addendum)
Your physician recommends that you schedule a follow-up appointment in: [place] has recommended you make the following change in your medication:   STOP PLAVIX  INCREASE IMDUR 30 MG DAILY  Thank you for choosing [facility]!!

## 2019-09-13 NOTE — Progress Notes (Signed)
Virtual Visit via Telephone Note   This visit type was conducted due to national recommendations for restrictions regarding the COVID-19 Pandemic (e.g. social distancing) in an effort to limit this patient's exposure and mitigate transmission in our community.  Due to his co-morbid illnesses, this patient is at least at moderate risk for complications without adequate follow up.  This format is felt to be most appropriate for this patient at this time.  The patient did not have access to video technology/had technical difficulties with video requiring transitioning to audio format only (telephone).  All issues noted in this document were discussed and addressed.  No physical exam could be performed with this format.  Please refer to the patient's chart for his  consent to telehealth for [facility].   The patient was identified using 2 identifiers.  Date:  09/13/2019   ID:  Mike Wright, DOB 1933-11-19, MRN [id number]  Patient Location: Home Provider Location: Home  PCP:  Monico Blitz, MD  Cardiologist:  Carlyle Dolly, MD  Electrophysiologist:  None   Evaluation Performed:  Follow-Up Visit  Chief Complaint:  Follow up  History of Present Illness:    Mike Wright is a 86 y.o. male seen today for focused visit on recent issues with diastolic HF.   1. CAD - nonobstructive CAD by cath Jan 2012, LVEF 60-65% by LV gram.  - 06/2015 nuclear stress without clear ischemia - 05/2015 echo LVEF 65-70%, no WMAs, cannot evaluate diastolic function 08/8125 lexiscan without ischemia, low risk study.  -cath 04/2017 as reported below. Received DES to 75% D1, DES to 75% mid to distal LAD, DES to 70% prox LAD RHC with CI 2.67, mean PA 25, no wedge reported by LVEDP 14.  - discharged on triple therapy with ASA, plavix, eliquis with plan for 30 days, then stop ASA.   - 11/22/17 admission with chest pain, anemia. Negative workup for ACS. Symptomsresolved with blood transfusion -  11/2017 admit with chest pain and dyspnea. ACS work negative, CT PE negative. Diuresed 6L with resolustion of symptoms. CXR with rib fracture thought playing a role in chest pain.    - some chronic chest pain across entire chest. Better with SL NG. Can occur at any time. Overall unchanged   2. Chronic diastolic HF - recent admission 11/2018 with acute on chronic diastolic HF - no recent SOB/DOE. No recent edema. Takes lasix 40mg  bid - home weights down to 184 lbs. Hospital discharge weight 191 lbs  - no recent edema - no recent SOB/DOE   3. Afib - admission 11/2018 with issues with afib - from notes had afib with slow rates, toprol was stopped  - no recent palpitations  4.History of GI bleeding - GI bleed during 11/2018, stopped eliquis at that time - heme +positive stools, negative endoscopy with plans for outpatient pill endoscopy    5. PAD - followed by vascular - admission 08/2017 with ischemia great toes. Had intervention on lower extremity vessels at that time.  -ultimately required ampuation bilateral great toes  - admit 11/2017 for poor healing right great toe amputation, critical limb ischemia - admit 02/2019 with gangrene right second toe with nonhealing diabetic foot wound. S/p amputations - additional toe amputation 05/2019  6. PSVT/Palpitations. - denies any symptoms  The patient does not have symptoms concerning for COVID-19 infection (fever, chills, cough, or new shortness of breath).    Past Medical History:  Diagnosis Date  . Anemia    a. mild, noted 04/2017.  [name]  CAD in native artery    a. [name] 04/2017 s/p DES to D1, DES to prox-mid LAD, DES to prox LAD overlapping the prior stent, LVEF 55-65%.   . Chronic diastolic CHF (congestive heart failure) ([place])   . Constipation   . COPD (chronic obstructive pulmonary disease) ([place])   . Diabetic ulcer of toe ([place])   . DJD (degenerative joint disease) of cervical spine   . Dysrhythmia    AFib  .  Essential hypertension   . GERD (gastroesophageal reflux disease)   . History of hiatal hernia   . HIT (heparin-induced thrombocytopenia) ([name])   . Hypothyroidism   . Hypoxia    a. went home on home O2 04/2017.  [name] Insomnia   . Mixed hyperlipidemia   . PAD (peripheral artery disease) ([place])   . PAF (paroxysmal atrial fibrillation) ([name])   . PVD (peripheral vascular disease) ([name])   . Renal insufficiency   . 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  . Sinus bradycardia    a. HR 30s-40s in 04/2017 -> diltiazem stopped, metoprolol reduced.  . Sleep apnea    "chose not to order CPAP at this time" (05/18/2017)  . Type 2 diabetes mellitus ([name])   . Wheezing    a. suspected COPD 04/2017. Former tobacco x 40 years.   Past Surgical History:  Procedure Laterality Date  . ABDOMINAL AORTOGRAM W/LOWER EXTREMITY N/A 09/15/2017   Procedure: ABDOMINAL AORTOGRAM W/LOWER EXTREMITY;  Surgeon: Serafina Mitchell, MD;  Location: [place] [facility];  Service: Cardiovascular;  Laterality: N/A;  . AMPUTATION Bilateral 09/20/2017   Procedure: BILATERAL GREAT TOE AMPUTATIONS INCLUDING METATARSAL HEADS;  Surgeon: Serafina Mitchell, MD;  [facility];  Service: Vascular;  Laterality: Bilateral;  . AMPUTATION Right 03/01/2019   Procedure: AMPUTATION TOES;  Surgeon: Angelia Mould, MD;  Location: [place];  Service: Vascular;  Laterality: Right;  . AMPUTATION Right 05/29/2019   Procedure: AMPUTATION RIGHT THIRD TOE;  Surgeon: Serafina Mitchell, MD;  Location: [place];  Service: Vascular;  Laterality: Right;  . ANGIOPLASTY Right 09/20/2017   Procedure: ANGIOPLASTY RIGHT TIBIAL ARTERY;  Surgeon: Serafina Mitchell, MD;  Location: [place];  Service: Vascular;  Laterality: Right;  . APPENDECTOMY    . BACK SURGERY    . CARDIAC CATHETERIZATION  1980s; 2012;  . CATARACT EXTRACTION Left 2004  . COLONOSCOPY  2004   FLEISHMAN TICS  . COLONOSCOPY WITH PROPOFOL N/A 11/28/2018   Procedure: COLONOSCOPY WITH PROPOFOL;  Surgeon:  Rogene Houston, MD;  [facility];  Service: Endoscopy;  Laterality: N/A;  . CORONARY ANGIOPLASTY WITH STENT PLACEMENT  05/18/2017   "3 stents"  . CORONARY STENT INTERVENTION N/A 05/18/2017   Procedure: CORONARY STENT INTERVENTION;  Surgeon: Jettie Booze, MD;  Location: [place] [facility];  Service: Cardiovascular;  Laterality: N/A;  . ESOPHAGOGASTRODUODENOSCOPY (EGD) WITH PROPOFOL N/A 11/20/2017   Procedure: ESOPHAGOGASTRODUODENOSCOPY (EGD) WITH PROPOFOL;  Surgeon: Irene Shipper, MD;  Location: [place];  Service: Gastroenterology;  Laterality: N/A;  . ESOPHAGOGASTRODUODENOSCOPY (EGD) WITH PROPOFOL N/A 11/28/2018   Procedure: ESOPHAGOGASTRODUODENOSCOPY (EGD) WITH PROPOFOL;  Surgeon: Rogene Houston, MD;  [facility];  Service: Endoscopy;  Laterality: N/A;  . ESOPHAGOGASTRODUODENOSCOPY (EGD) WITH PROPOFOL N/A 07/12/2019   Procedure: ESOPHAGOGASTRODUODENOSCOPY (EGD) WITH PROPOFOL;  Surgeon: Rogene Houston, MD;  [facility];  Service: Endoscopy;  Laterality: N/A;  125  . GIVENS CAPSULE STUDY N/A 12/17/2018   Procedure: GIVENS CAPSULE STUDY;  Surgeon: Rogene Houston, MD;  [facility];  Service: Endoscopy;  Laterality: N/A;  7:30am  . I & D EXTREMITY Right 12/17/2017   Procedure: IRRIGATION AND DEBRIDEMENT RIGHT GREAT TOE;  Surgeon: Serafina Mitchell, MD;  [facility];  Service: Vascular;  Laterality: Right;  . JOINT REPLACEMENT    . LEFT HEART CATH AND CORONARY ANGIOGRAPHY N/A 05/18/2017   Procedure: LEFT HEART CATH AND CORONARY ANGIOGRAPHY;  Surgeon: Jettie Booze, MD;  Location: [place] [facility];  Service: Cardiovascular;  Laterality: N/A;  . LOWER EXTREMITY ANGIOGRAM Right 09/20/2017   Procedure: RIGHT LOWER LEG  ANGIOGRAM;  Surgeon: Serafina Mitchell, MD;  Location: [place];  Service: Vascular;  Laterality: Right;  . LUMBAR FUSION  2002   L3, 4 L4, 5 L5 S1 Fused by Dr. Glenna Fellows  . PERIPHERAL VASCULAR BALLOON ANGIOPLASTY Left  09/15/2017   Procedure: PERIPHERAL VASCULAR BALLOON ANGIOPLASTY;  Surgeon: Serafina Mitchell, MD;  Location: [place] [facility];  Service: Cardiovascular;  Laterality: Left;  PTA of Peroneal & Posterior Tibial  . POLYPECTOMY  11/28/2018   Procedure: POLYPECTOMY;  Surgeon: Rogene Houston, MD;  [facility];  Service: Endoscopy;;  duodenum  . POSTERIOR LUMBAR FUSION    . RHINOPLASTY    . RIGHT HEART CATH N/A 05/18/2017   Procedure: RIGHT HEART CATH;  Surgeon: Jettie Booze, MD;  Location: [place] [facility];  Service: Cardiovascular;  Laterality: N/A;  . TEE WITHOUT CARDIOVERSION N/A 09/18/2017   Procedure: TRANSESOPHAGEAL ECHOCARDIOGRAM (TEE);  Surgeon: Fay Records, MD;  Location: [facility];  Service: Cardiovascular;  Laterality: N/A;  . TOTAL KNEE ARTHROPLASTY Bilateral   . TRANSURETHRAL RESECTION OF PROSTATE  2001   [place]     No outpatient medications have been marked as taking for the 09/13/19 encounter (Appointment) with Arnoldo Lenis, MD.     Allergies:   Bactrim [sulfamethoxazole-trimethoprim], Codeine, Heparin, Losartan, Oxycodone, and Feraheme [ferumoxytol]   Social History   Tobacco Use  . Smoking status: Former Smoker    Packs/day: 2.00    Years: 30.00    Pack years: 60.00    Types: Cigarettes    Start date: 03/28/1953    Quit date: 03/29/1983    Years since quitting: 36.4  . Smokeless tobacco: Never Used  Vaping Use  . Vaping Use: Never used  Substance Use Topics  . Alcohol use: Not Currently    Comment: 05/18/2017 'I drink a beer q yr"  . Drug use: No     Family Hx: The patient's family history includes COPD in his father and mother; CVA in his sister; Diabetes in his mother and sister; Heart attack (age of onset: 54) in his father; Heart disease in his mother; Hypertension in his sister; Multiple sclerosis in his sister.  ROS:   Please see the history of present illness.     All other systems reviewed and are negative.   Prior CV  studies:   The following studies were reviewed today:  05/2015 echo Study Conclusions  - Left ventricle: The cavity size was normal. Wall thickness was increased increased in a pattern of mild to moderate LVH. Systolic function was vigorous. The estimated ejection fraction was in the range of 65% to 70%. Wall motion was normal; there were no regional wall motion abnormalities. The study is not technically sufficient to allow evaluation of LV diastolic function. - Aortic valve: Moderately calcified annulus. Mildly thickened leaflets. There was mild stenosis. There  was mild regurgitation. Mean gradient (S): 13 mm Hg. Valve area (VTI): 1.51 cm^2. - Mitral valve: Mildly calcified annulus. Normal thickness leaflets . - Left atrium: The atrium was moderately dilated. - Technically adequate study.   06/2015 Nuclear stress test  No diagnostic ST segment changes to indicate ischemia.  Small, mild intensity, perfusion defects noted in the apical anterior and basal inferolateral walls. This is most consistent with soft tissue attenuation given normal wall motion in these regions. No large ischemic zones noted.  This is a low risk study.  Nuclear stress EF: 64%.  05/2016 Event monitor  Rhythm is atrial fibrillation throughout study. Occasioanal PVCs  Min HR 51, Max HR 107, Avg HR 67  Reported symptoms correspond with rate controlled atrial fibrillation.  09/2016 nuclear stress  There was no ST segment deviation noted during stress.  The study is normal. There are no perfusion defects consistent with prior infarct or current ischemia.  This is a low risk study.  The left ventricular ejection fraction is normal (55-65%).   04/2017 cath  Dist RCA lesion is 40% stenosed.  Prox RCA lesion is 25% stenosed.  1st Diag lesion is 75% stenosed.  A drug-eluting stent was successfully placed using a STENT SYNERGY DES 2.25X12.  Post intervention, there is a 0%  residual stenosis.  Prox LAD to Mid LAD lesion is 75% stenosed.  A drug-eluting stent was successfully placed using a STENT SYNERGY DES 3.5X38.  Post intervention, there is a 0% residual stenosis.  Prox LAD lesion is 70% stenosed.  A drug-eluting stent was successfully placed using a STENT SYNERGY DES 4X12, overlapping the prior stent.  Post intervention, there is a 0% residual stenosis.  The left ventricular systolic function is normal.  The left ventricular ejection fraction is 55-65% by visual estimate.  LV end diastolic pressure is normal.  There is no aortic valve stenosis.  Ao sat 98%, PA sat 65%; PA mean 25 mm Hg; unable to wedge the catheter.  Normal right heat pressures.  Tortuous right subclavian making catheter navigation difficult from the right radial approach.  Continue dual antiplatelet therapy along with Eliquis for 30 days.  Restart Eliquis tomorrow.  After 30 days, stop aspirin. Continue Plavix and Eliquis.   After 6 months, can consider stopping clopidogrel if he has bleeding issues. Given the nomber of stents, would try to complete one year of clopidogrel if no bleeding issues.  Labs/Other Tests and Data Reviewed:    EKG:  No ECG reviewed.  Recent Labs: 11/26/2018: B Natriuretic Peptide 428.0 07/27/2019: ALT 15 07/28/2019: BUN 44; Creatinine, Ser 1.62; Hemoglobin 7.7; Magnesium 2.2; Platelets 166; Potassium 4.4; Sodium 137   Recent Lipid Panel Lab Results  Component Value Date/Time   CHOL 68 03/01/2019 03:20 AM   TRIG 114 03/01/2019 03:20 AM   HDL 16 (L) 03/01/2019 03:20 AM   CHOLHDL 4.3 03/01/2019 03:20 AM   LDLCALC 29 03/01/2019 03:20 AM    Wt Readings from Last 3 Encounters:  07/28/19 195 lb 8.8 oz (88.7 kg)  07/10/19 188 lb (85.3 kg)  07/01/19 191 lb (86.6 kg)     Objective:    Vital Signs:   Today's Vitals   09/13/19 0948  BP: (!) 147/68  Pulse: 72  Weight: 187 lb (84.8 kg)  Height: 5\' 7"  (1.702 m)   Body mass index is  29.29 kg/m. Normal affect. Normal speech pattern and tone. Comfortable, no apparent distress. No audible signs of sob or wheezing.   ASSESSMENT & PLAN:  1.CAD -chronic chest pains unchanged, will try increase imdur ot 30mg  daily to see if benefit - can stop plavix, continue eliquis  2. Chronic diastolic HF - continue current diuretic, denies symptoms  3. Afib - off av nodal agents due to bradycardia. - no symptoms, continue meds  4. HTN - elevated but just took meds, continue to monitor.   COVID-19 Education: The signs and symptoms of COVID-19 were discussed with the patient and how to seek care for testing (follow up with PCP or arrange E-visit).  The importance of social distancing was discussed today.  Time:   Today, I have spent 17 minutes with the patient with telehealth technology discussing the above problems.     Medication Adjustments/Labs and Tests Ordered: Current medicines are reviewed at length with the patient today.  Concerns regarding medicines are outlined above.   Tests Ordered: No orders of the defined types were placed in this encounter.   Medication Changes: No orders of the defined types were placed in this encounter.   Follow Up:  In Person in 4 month(s)  Signed, Carlyle Dolly, MD  09/13/2019 7:57 AM    [place]

## 2019-09-13 NOTE — Patient Outreach (Signed)
[facility]) Care Management  09/13/2019  Mike Wright Jan 27, 1934 [id number]   Telephone call to patient for disease management follow up.  No answer.  HIPAA compliant voice message left.  Plan: RN CM will wait for return call.  If no return call will send letter and attempt again in the month of August.   Tomeka Kantner J Malak Duchesneau, RN, MSN [facility] Care Management Coordinator Direct Line [phone] Cell [phone] Toll Free: [phone]  Fax: [phone]

## 2019-09-13 NOTE — Addendum Note (Signed)
Addended by: Julian Hy T on: 09/13/2019 10:26 AM   Modules accepted: Orders

## 2019-09-23 DIAGNOSIS — Z6828 Body mass index (BMI) 28.0-28.9, adult: Secondary | ICD-10-CM | POA: Diagnosis not present

## 2019-09-23 DIAGNOSIS — Z7189 Other specified counseling: Secondary | ICD-10-CM | POA: Diagnosis not present

## 2019-09-23 DIAGNOSIS — Z79899 Other long term (current) drug therapy: Secondary | ICD-10-CM | POA: Diagnosis not present

## 2019-09-23 DIAGNOSIS — I10 Essential (primary) hypertension: Secondary | ICD-10-CM | POA: Diagnosis not present

## 2019-09-23 DIAGNOSIS — Z1331 Encounter for screening for depression: Secondary | ICD-10-CM | POA: Diagnosis not present

## 2019-09-23 DIAGNOSIS — E7800 Pure hypercholesterolemia, unspecified: Secondary | ICD-10-CM | POA: Diagnosis not present

## 2019-09-23 DIAGNOSIS — I5032 Chronic diastolic (congestive) heart failure: Secondary | ICD-10-CM | POA: Diagnosis not present

## 2019-09-23 DIAGNOSIS — Z0000 Encounter for general adult medical examination without abnormal findings: Secondary | ICD-10-CM | POA: Diagnosis not present

## 2019-09-23 DIAGNOSIS — Z125 Encounter for screening for malignant neoplasm of prostate: Secondary | ICD-10-CM | POA: Diagnosis not present

## 2019-09-23 DIAGNOSIS — R5383 Other fatigue: Secondary | ICD-10-CM | POA: Diagnosis not present

## 2019-09-23 DIAGNOSIS — Z1339 Encounter for screening examination for other mental health and behavioral disorders: Secondary | ICD-10-CM | POA: Diagnosis not present

## 2019-09-23 DIAGNOSIS — Z1211 Encounter for screening for malignant neoplasm of colon: Secondary | ICD-10-CM | POA: Diagnosis not present

## 2019-09-23 DIAGNOSIS — Z299 Encounter for prophylactic measures, unspecified: Secondary | ICD-10-CM | POA: Diagnosis not present

## 2019-09-24 ENCOUNTER — Telehealth: Payer: Self-pay

## 2019-09-24 NOTE — Telephone Encounter (Signed)
Triage call received from pt. Reports five days ago, he noticed his Rt 4th toe was red "about 1/4" into foot", swollen and warm to touch. Yesterday the toe began draining yellow/green fluid and painful. He denies any fever. Pt scheduled for evaluation with PA on tomorrow at 145pm and advised if symptoms worsen to seek emergency tx. Pt voiced understanding.

## 2019-09-25 ENCOUNTER — Ambulatory Visit (INDEPENDENT_AMBULATORY_CARE_PROVIDER_SITE_OTHER): Payer: Medicare HMO | Admitting: Physician Assistant

## 2019-09-25 ENCOUNTER — Other Ambulatory Visit: Payer: Self-pay

## 2019-09-25 ENCOUNTER — Encounter (HOSPITAL_COMMUNITY): Payer: Self-pay | Admitting: Surgery

## 2019-09-25 VITALS — BP 118/68 | HR 79 | Resp 18 | Ht 67.0 in | Wt 188.0 lb

## 2019-09-25 DIAGNOSIS — I96 Gangrene, not elsewhere classified: Secondary | ICD-10-CM | POA: Diagnosis not present

## 2019-09-25 DIAGNOSIS — D649 Anemia, unspecified: Secondary | ICD-10-CM | POA: Diagnosis not present

## 2019-09-25 DIAGNOSIS — I10 Essential (primary) hypertension: Secondary | ICD-10-CM | POA: Diagnosis not present

## 2019-09-25 MED ORDER — DOXYCYCLINE HYCLATE 100 MG PO CAPS: 100.0000 mg | ORAL_CAPSULE | Freq: Two times a day (BID) | ORAL | 0 refills | Status: DC

## 2019-09-25 NOTE — Progress Notes (Signed)
Office Note     CC:  Red toe Requesting Provider:  Monico Blitz, MD  HPI: Mike Wright is a 86 y.o. (May 08, 1933) male who presents with a 2 week history of blackened 4th toenail now with foul odor and right foot edema.  He denies fever or chills.  He is accompanied by his wife today.  He ambulates with a cane and denies claudication or rest pain.  He is status post balloon angioplasty of the left peroneal and posterior tibial artery as well as the right posterior tibial artery in June 2019. 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 When he presented to the office in September, he had cellulitis on his foot and was admitted for IV antibiotics.He presented with a gangrenous right second toe and had a second toe amputation by Dr. Scot Dock on 14-06-2019. He later developed a 3rd toe infection and had a toe amputation on 05-29-2019  He has a hx of chest pain and underwent cardiac catheterization in February 2019 with stent placement. He states Dr. [name] discontinued his Plavix. He is on Eliquis for PAF. He is on insulin and metformin for diabetes. He is on nebs and inhalers for his COPD. The pt is on a statin for cholesterol management.He takes  ACEI, nitrate and Lasix for blood pressure management.  Past Medical History:  Diagnosis Date  . Anemia    a. mild, noted 04/2017.  [name] CAD in native artery    a. [name] 04/2017 s/p DES to D1, DES to prox-mid LAD, DES to prox LAD overlapping the prior stent, LVEF 55-65%.   . Chronic diastolic CHF (congestive heart failure) ([place])   . Constipation   . COPD (chronic obstructive pulmonary disease) ([place])   . Diabetic ulcer of toe ([place])   . DJD (degenerative joint disease) of cervical spine   . Dysrhythmia    AFib  . Essential hypertension   . GERD (gastroesophageal reflux disease)   . History of hiatal hernia   . HIT (heparin-induced thrombocytopenia) ([place])   . Hypothyroidism   . Hypoxia    a. went home on home O2 04/2017.  [name]  Insomnia   . Mixed hyperlipidemia   . PAD (peripheral artery disease) ([place])   . PAF (paroxysmal atrial fibrillation) ([place])   . PVD (peripheral vascular disease) ([place])   . Renal insufficiency   . 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  . Sinus bradycardia    a. HR 30s-40s in 04/2017 -> diltiazem stopped, metoprolol reduced.  . Sleep apnea    "chose not to order CPAP at this time" (05/18/2017)  . Type 2 diabetes mellitus ([place])   . Wheezing    a. suspected COPD 04/2017. Former tobacco x 40 years.    Past Surgical History:  Procedure Laterality Date  . ABDOMINAL AORTOGRAM W/LOWER EXTREMITY N/A 09/15/2017   Procedure: ABDOMINAL AORTOGRAM W/LOWER EXTREMITY;  Surgeon: Serafina Mitchell, MD;  Location: [facility];  Service: Cardiovascular;  Laterality: N/A;  . AMPUTATION Bilateral 09/20/2017   Procedure: BILATERAL GREAT TOE AMPUTATIONS INCLUDING METATARSAL HEADS;  Surgeon: Serafina Mitchell, MD;  [facility];  Service: Vascular;  Laterality: Bilateral;  . AMPUTATION Right 03/01/2019   Procedure: AMPUTATION TOES;  Surgeon: Angelia Mould, MD;  Location: [facility];  Service: Vascular;  Laterality: Right;  . AMPUTATION Right 05/29/2019   Procedure: AMPUTATION RIGHT THIRD TOE;  Surgeon: Serafina Mitchell, MD;  Location: [place];  Service: Vascular;  Laterality: Right;  .  ANGIOPLASTY Right 09/20/2017   Procedure: ANGIOPLASTY RIGHT TIBIAL ARTERY;  Surgeon: Serafina Mitchell, MD;  Location[facility];  Service: Vascular;  Laterality: Right;  . APPENDECTOMY    . BACK SURGERY    . CARDIAC CATHETERIZATION  1980s; 2012;  . CATARACT EXTRACTION Left 2004  . COLONOSCOPY  2004   FLEISHMAN TICS  . COLONOSCOPY WITH PROPOFOL N/A 11/28/2018   Procedure: COLONOSCOPY WITH PROPOFOL;  Surgeon: Rogene Houston, MD;  [facility];  Service: Endoscopy;  Laterality: N/A;  . CORONARY ANGIOPLASTY WITH STENT PLACEMENT  05/18/2017   "3 stents"  . CORONARY STENT INTERVENTION N/A 05/18/2017   Procedure:  CORONARY STENT INTERVENTION;  Surgeon: Jettie Booze, MD;  Location: [facility] [facility];  Service: Cardiovascular;  Laterality: N/A;  . ESOPHAGOGASTRODUODENOSCOPY (EGD) WITH PROPOFOL N/A 11/20/2017   Procedure: ESOPHAGOGASTRODUODENOSCOPY (EGD) WITH PROPOFOL;  Surgeon: Irene Shipper, MD;  Location: [place];  Service: Gastroenterology;  Laterality: N/A;  . ESOPHAGOGASTRODUODENOSCOPY (EGD) WITH PROPOFOL N/A 11/28/2018   Procedure: ESOPHAGOGASTRODUODENOSCOPY (EGD) WITH PROPOFOL;  Surgeon: Rogene Houston, MD;  [facility];  Service: Endoscopy;  Laterality: N/A;  . ESOPHAGOGASTRODUODENOSCOPY (EGD) WITH PROPOFOL N/A 07/12/2019   Procedure: ESOPHAGOGASTRODUODENOSCOPY (EGD) WITH PROPOFOL;  Surgeon: Rogene Houston, MD;  [facility];  Service: Endoscopy;  Laterality: N/A;  125  . GIVENS CAPSULE STUDY N/A 12/17/2018   Procedure: GIVENS CAPSULE STUDY;  Surgeon: Rogene Houston, MD;  [facility];  Service: Endoscopy;  Laterality: N/A;  7:30am  . I & D EXTREMITY Right 12/17/2017   Procedure: IRRIGATION AND DEBRIDEMENT RIGHT GREAT TOE;  Surgeon: Serafina Mitchell, MD;  [facility];  Service: Vascular;  Laterality: Right;  . JOINT REPLACEMENT    . LEFT HEART CATH AND CORONARY ANGIOGRAPHY N/A 05/18/2017   Procedure: LEFT HEART CATH AND CORONARY ANGIOGRAPHY;  Surgeon: Jettie Booze, MD;  Location: [facility];  Service: Cardiovascular;  Laterality: N/A;  . LOWER EXTREMITY ANGIOGRAM Right 09/20/2017   Procedure: RIGHT LOWER LEG  ANGIOGRAM;  Surgeon: Serafina Mitchell, MD;  Location: [place];  Service: Vascular;  Laterality: Right;  . LUMBAR FUSION  2002   L3, 4 L4, 5 L5 S1 Fused by Dr. Glenna Fellows  . PERIPHERAL VASCULAR BALLOON ANGIOPLASTY Left 09/15/2017   Procedure: PERIPHERAL VASCULAR BALLOON ANGIOPLASTY;  Surgeon: Serafina Mitchell, MD;  Location: [place] [facility];  Service: Cardiovascular;  Laterality: Left;  PTA of Peroneal & Posterior Tibial  .  POLYPECTOMY  11/28/2018   Procedure: POLYPECTOMY;  Surgeon: Rogene Houston, MD;  [facility];  Service: Endoscopy;;  duodenum  . POSTERIOR LUMBAR FUSION    . RHINOPLASTY    . RIGHT HEART CATH N/A 05/18/2017   Procedure: RIGHT HEART CATH;  Surgeon: Jettie Booze, MD;  Location: [facility];  Service: Cardiovascular;  Laterality: N/A;  . TEE WITHOUT CARDIOVERSION N/A 09/18/2017   Procedure: TRANSESOPHAGEAL ECHOCARDIOGRAM (TEE);  Surgeon: Fay Records, MD;  Location: [place];  Service: Cardiovascular;  Laterality: N/A;  . TOTAL KNEE ARTHROPLASTY Bilateral   . TRANSURETHRAL RESECTION OF [place] History   Socioeconomic History  . Marital status: Married    Spouse name: Not on file  . Number of children: Not on file  . Years of education: Not on file  . Highest education level: Not on file  Occupational History  . Not on file  Tobacco Use  . Smoking status: Former Smoker    Packs/day: 2.00    Years: 30.00    Pack years: 60.00    Types: Cigarettes    Start date: 03/28/1953    Quit date: 03/29/1983    Years since quitting: 36.5  . Smokeless tobacco: Never Used  Vaping Use  . Vaping Use: Never used  Substance and Sexual Activity  . Alcohol use: Not Currently    Comment: 05/18/2017 'I drink a beer q yr"  . Drug use: No  . Sexual activity: Not Currently  Other Topics Concern  . Not on file  Social History Narrative   Patient lives at home with his spouse.    Social Determinants of Health   Financial Resource Strain: Medium Risk  . Difficulty of Paying Living Expenses: Somewhat hard  Food Insecurity: No Food Insecurity  . Worried About Charity fundraiser in the Last Year: Never true  . Ran Out of Food in the Last Year: Never true  Transportation Needs: No Transportation Needs  . Lack of Transportation (Medical): No  . Lack of Transportation (Non-Medical): No  Physical Activity:   . Days of Exercise per Week:   .  Minutes of Exercise per Session:   Stress: No Stress Concern Present  . Feeling of Stress : Not at all  Social Connections:   . Frequency of Communication with Friends and Family:   . Frequency of Social Gatherings with Friends and Family:   . Attends Religious Services:   . Active Member of Clubs or Organizations:   . Attends Archivist Meetings:   [name] Marital Status:   Intimate Partner Violence:   . Fear of Current or Ex-Partner:   . Emotionally Abused:   [name] Physically Abused:   . Sexually Abused:    Family History  Problem Relation Age of Onset  . Heart attack Father 63  . COPD Father   . COPD Mother   . Heart disease Mother   . Diabetes Mother   . Hypertension Sister   . CVA Sister   . Diabetes Sister   . Multiple sclerosis Sister     Current Outpatient Medications  Medication Sig Dispense Refill  . acetaminophen (TYLENOL) 500 MG tablet Take 1 tablet (500 mg total) by mouth every 6 (six) hours as needed for mild pain or headache. 30 tablet 0  . apixaban (ELIQUIS) 2.5 MG TABS tablet Take 1 tablet (2.5 mg total) by mouth 2 (two) times daily. 60 tablet 1  . atorvastatin (LIPITOR) 80 MG tablet Take 80 mg by mouth every evening.     . Cholecalciferol (VITAMIN D) 50 MCG (2000 UT) tablet Take 2,000 Units by mouth daily.    . Cyanocobalamin (B-12) 2500 MCG TABS Take 2,500 mcg by mouth daily.    . finasteride (PROSCAR) 5 MG tablet Take 1 tablet (5 mg total) by mouth daily. 30 tablet 1  . folic acid (FOLVITE) 1 MG tablet Take 1 tablet (1 mg total) by mouth daily. 30 tablet 1  . furosemide (LASIX) 40 MG tablet TAKE 1 TABLET DAILY - MAY TAKE AN EXTRA 20 MG AS NEEDED (DOSE CHANGE 04/01/19) 135 tablet 1  . insulin glargine (LANTUS) 100 UNIT/ML injection Inject 30-40 Units into the skin See admin instructions. Inject 30 units SQ in the morning and inject 40 units SQ at bedtime    . isosorbide mononitrate (IMDUR) 30 MG 24 hr tablet Take 1 tablet (30 mg total) by mouth daily. [place]  tablet 1  . lisinopril (ZESTRIL) 5 MG tablet Take 0.5 tablets (2.5 mg total) by mouth daily. (Patient taking differently: Take 5 mg by mouth daily. )    . meclizine (ANTIVERT) 25 MG tablet Take 25 mg by mouth daily as needed for dizziness.    . metFORMIN (GLUCOPHAGE) 1000 MG tablet Take 1,000 mg by mouth 2 (two) times daily.    . nitroGLYCERIN (NITROSTAT) 0.4 MG SL tablet PLACE 1 TABLET (0.4 MG TOTAL) UNDER THE TONGUE EVERY 5  MINUTES AS NEEDED FOR CHEST PAIN. (Patient taking differently: Place 0.4 mg under the tongue every 5 (five) minutes as needed for chest pain. ) 25 tablet 3  . pantoprazole (PROTONIX) 40 MG tablet Take 1 tablet (40 mg total) by mouth 2 (two) times daily. To protect stomach while taking multiple blood thinners. 60 tablet 2  . potassium chloride SA (K-DUR) 20 MEQ tablet Take 1 tablet (20 mEq total) by mouth daily. 30 tablet 1  . psyllium (METAMUCIL) 58.6 % packet Take 0.5 packets by mouth 2 (two) times daily.     . traZODone (DESYREL) 100 MG tablet Take 100 mg by mouth at bedtime.      No current facility-administered medications for this visit.    Allergies  Allergen Reactions  . Bactrim [Sulfamethoxazole-Trimethoprim]     Pancytopenia  . Codeine Shortness Of Breath  . Heparin Other (See Comments)    +HIT,  Severe bleeding (with heparin drip & large doses), tolerates low doses  . Losartan Swelling    Per ENT UNSPECIFIED REACTION   . Oxycodone Other (See Comments)    "Made me act out of my mind" Mental status changes hallucinations  . Feraheme [Ferumoxytol]     Diaphoretic, chest pain     REVIEW OF SYSTEMS:   [X]  denotes positive finding, [ ]  denotes negative finding Cardiac  Comments:  Chest pain or chest pressure:    Shortness of breath upon exertion:    Short of breath when lying flat:    Irregular heart rhythm:        Vascular    Pain in calf, thigh, or hip brought on by ambulation:    Pain in feet at night that wakes you up from your sleep:       Blood clot in your veins:    Leg swelling:  x  right foot      Pulmonary    Oxygen at home:    Productive cough:     Wheezing:         Neurologic    Sudden weakness in arms or legs:     Sudden numbness in arms or legs:     Sudden onset of difficulty speaking or slurred speech:    Temporary loss of vision in one eye:     Problems with dizziness:         Gastrointestinal    Blood in stool:     Vomited blood:         Genitourinary    Burning when urinating:     Blood in urine:        Psychiatric    Major depression:         Hematologic    Bleeding problems:    Problems with blood clotting too easily:        Skin    Rashes or ulcers:        Constitutional    Fever or chills:      PHYSICAL EXAMINATION:  Vitals:  09/25/19 1345  BP: 118/68  Pulse: 79  Resp: 18  SpO2: 97%   General:  WDWN in NAD; vital signs documented above Gait: Steady, no ataxia, with cane HENT: WNL, normocephalic Pulmonary: normal non-labored breathing Cardiac: regular HR, rhythym  Skin: without rashes Vascular Exam/Pulses: 2+ bilateral femoral pulses Extremities: with ischemic changes, with Gangrene , with cellulitis; without open wounds;  Musculoskeletal: no muscle wasting or atrophy  Neurologic: A&O X 3;  No focal weakness or paresthesias are detected Psychiatric:  The pt has Normal affect.     Arteriogram dated September 15, 2017 Impression:               #1  Short segment occlusion of the peroneal artery successfully crossed and treated with a 3 mm balloon with no residual stenosis.               #2  Multiple high-grade, greater than 90% areas of stenosis throughout the posterior tibial artery across the ankle.  I treated the posterior tibial artery out across the ankle with a 2 mm balloon and the remaining portion of the posterior tibial artery with a 3 mm balloon with no residual stenosis.               #3  Tibial disease on the right.  The patient will be brought back in  approximately 1 week for intervention of the right posterior tibial artery  Patient then underwent right PT angioplasty  Non-Invasive Vascular Imaging:   Right lower extremity duplex and ABIs done on May 27, 2019  focal velocity elevation of 212 cm/s was obtained at proximal SFA with post stenotic turbulence with a VR of 2.7. Findings are characteristic of 50-74% stenosis. A 2nd focal velocity elevation was visualized, measuring 160 cm/s at mid SFA. Findings are characteristic of 30-49% stenosis.  ABI/TBIToday's ABIToday's TBIPrevious ABIPrevious TBI  +-------+-----------+-----------+------------+------------+  Right 0.82    amputation               +-------+-----------+-----------+------------+------------+  Left  0.93    amputation               +-------+-----------+-----------+------------+------------+  Biphasic waveforms bilaterally  ASSESSMENT/PLAN:: 86 y.o. male here for evaluation of right 4th toe.  Gangrene of distal 4th toe phalanx and increased warmth of dorsum of right foot.  Recommend toe amp, antibiotics.  We will make arrangements for toe amputation tomorrow with Dr. Trula Slade.  The patient will hold his Eliquis today and tomorrow.  Depending upon operative findings, will possibly proceed with aortogram with right lower extremity runoff with or without intervention on Tuesday.  I explained this to the patient and his wife and they agree with this plan.  Barbie Banner, PA-C [facility] [phone]  Clinic MD: Oneida Alar

## 2019-09-25 NOTE — H&P (View-Only) (Signed)
Office Note     CC:  Red toe Requesting Provider:  Monico Blitz, MD  HPI: Mike Wright is a 86 y.o. (1934/01/27) male who presents with a 2 week history of blackened 4th toenail now with foul odor and right foot edema.  He denies fever or chills.  He is accompanied by his wife today.  He ambulates with a cane and denies claudication or rest pain.  He is status post balloon angioplasty of the left peroneal and posterior tibial artery as well as the right posterior tibial artery in June 2019. 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 When he presented to the office in September, he had cellulitis on his foot and was admitted for IV antibiotics.He presented with a gangrenous right second toe and had a second toe amputation by Dr. Scot Dock on 14-06-2019. He later developed a 3rd toe infection and had a toe amputation on 05-29-2019  He has a hx of chest pain and underwent cardiac catheterization in February 2019 with stent placement. He states Dr. [name] discontinued his Plavix. He is on Eliquis for PAF. He is on insulin and metformin for diabetes. He is on nebs and inhalers for his COPD. The pt is on a statin for cholesterol management.He takes  ACEI, nitrate and Lasix for blood pressure management.  Past Medical History:  Diagnosis Date  . Anemia    a. mild, noted 04/2017.  [name] CAD in native artery    a. [name] 04/2017 s/p DES to D1, DES to prox-mid LAD, DES to prox LAD overlapping the prior stent, LVEF 55-65%.   . Chronic diastolic CHF (congestive heart failure) ([place])   . Constipation   . COPD (chronic obstructive pulmonary disease) ([place])   . Diabetic ulcer of toe ([place])   . DJD (degenerative joint disease) of cervical spine   . Dysrhythmia    AFib  . Essential hypertension   . GERD (gastroesophageal reflux disease)   . History of hiatal hernia   . HIT (heparin-induced thrombocytopenia) ([place])   . Hypothyroidism   . Hypoxia    a. went home on home O2 04/2017.  [name]  Insomnia   . Mixed hyperlipidemia   . PAD (peripheral artery disease) ([place])   . PAF (paroxysmal atrial fibrillation) ([place])   . PVD (peripheral vascular disease) ([place])   . Renal insufficiency   . 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  . Sinus bradycardia    a. HR 30s-40s in 04/2017 -> diltiazem stopped, metoprolol reduced.  . Sleep apnea    "chose not to order CPAP at this time" (05/18/2017)  . Type 2 diabetes mellitus ([place])   . Wheezing    a. suspected COPD 04/2017. Former tobacco x 40 years.    Past Surgical History:  Procedure Laterality Date  . ABDOMINAL AORTOGRAM W/LOWER EXTREMITY N/A 09/15/2017   Procedure: ABDOMINAL AORTOGRAM W/LOWER EXTREMITY;  Surgeon: Serafina Mitchell, MD;  Location: [facility];  Service: Cardiovascular;  Laterality: N/A;  . AMPUTATION Bilateral 09/20/2017   Procedure: BILATERAL GREAT TOE AMPUTATIONS INCLUDING METATARSAL HEADS;  Surgeon: Serafina Mitchell, MD;  [facility];  Service: Vascular;  Laterality: Bilateral;  . AMPUTATION Right 03/01/2019   Procedure: AMPUTATION TOES;  Surgeon: Angelia Mould, MD;  Location: [facility];  Service: Vascular;  Laterality: Right;  . AMPUTATION Right 05/29/2019   Procedure: AMPUTATION RIGHT THIRD TOE;  Surgeon: Serafina Mitchell, MD;  Location: [facility];  Service: Vascular;  Laterality: Right;  .  ANGIOPLASTY Right 09/20/2017   Procedure: ANGIOPLASTY RIGHT TIBIAL ARTERY;  Surgeon: Serafina Mitchell, MD;  Location: [facility];  Service: Vascular;  Laterality: Right;  . APPENDECTOMY    . BACK SURGERY    . CARDIAC CATHETERIZATION  1980s; 2012;  . CATARACT EXTRACTION Left 2004  . COLONOSCOPY  2004   FLEISHMAN TICS  . COLONOSCOPY WITH PROPOFOL N/A 11/28/2018   Procedure: COLONOSCOPY WITH PROPOFOL;  Surgeon: Rogene Houston, MD;  [facility];  Service: Endoscopy;  Laterality: N/A;  . CORONARY ANGIOPLASTY WITH STENT PLACEMENT  05/18/2017   "3 stents"  . CORONARY STENT INTERVENTION N/A 05/18/2017   Procedure:  CORONARY STENT INTERVENTION;  Surgeon: Jettie Booze, MD;  Location: [facility];  Service: Cardiovascular;  Laterality: N/A;  . ESOPHAGOGASTRODUODENOSCOPY (EGD) WITH PROPOFOL N/A 11/20/2017   Procedure: ESOPHAGOGASTRODUODENOSCOPY (EGD) WITH PROPOFOL;  Surgeon: Irene Shipper, MD;  Location: [facility];  Service: Gastroenterology;  Laterality: N/A;  . ESOPHAGOGASTRODUODENOSCOPY (EGD) WITH PROPOFOL N/A 11/28/2018   Procedure: ESOPHAGOGASTRODUODENOSCOPY (EGD) WITH PROPOFOL;  Surgeon: Rogene Houston, MD;  [facility];  Service: Endoscopy;  Laterality: N/A;  . ESOPHAGOGASTRODUODENOSCOPY (EGD) WITH PROPOFOL N/A 07/12/2019   Procedure: ESOPHAGOGASTRODUODENOSCOPY (EGD) WITH PROPOFOL;  Surgeon: Rogene Houston, MD;  [facility];  Service: Endoscopy;  Laterality: N/A;  125  . GIVENS CAPSULE STUDY N/A 12/17/2018   Procedure: GIVENS CAPSULE STUDY;  Surgeon: Rogene Houston, MD;  [facility];  Service: Endoscopy;  Laterality: N/A;  7:30am  . I & D EXTREMITY Right 12/17/2017   Procedure: IRRIGATION AND DEBRIDEMENT RIGHT GREAT TOE;  Surgeon: Serafina Mitchell, MD;  [facility];  Service: Vascular;  Laterality: Right;  . JOINT REPLACEMENT    . LEFT HEART CATH AND CORONARY ANGIOGRAPHY N/A 05/18/2017   Procedure: LEFT HEART CATH AND CORONARY ANGIOGRAPHY;  Surgeon: Jettie Booze, MD;  Location: [facility];  Service: Cardiovascular;  Laterality: N/A;  . LOWER EXTREMITY ANGIOGRAM Right 09/20/2017   Procedure: RIGHT LOWER LEG  ANGIOGRAM;  Surgeon: Serafina Mitchell, MD;  Location: [facility];  Service: Vascular;  Laterality: Right;  . LUMBAR FUSION  2002   L3, 4 L4, 5 L5 S1 Fused by Dr. Glenna Fellows  . PERIPHERAL VASCULAR BALLOON ANGIOPLASTY Left 09/15/2017   Procedure: PERIPHERAL VASCULAR BALLOON ANGIOPLASTY;  Surgeon: Serafina Mitchell, MD;  Location: [facility];  Service: Cardiovascular;  Laterality: Left;  PTA of Peroneal & Posterior Tibial  .  POLYPECTOMY  11/28/2018   Procedure: POLYPECTOMY;  Surgeon: Rogene Houston, MD;  [facility];  Service: Endoscopy;;  duodenum  . POSTERIOR LUMBAR FUSION    . RHINOPLASTY    . RIGHT HEART CATH N/A 05/18/2017   Procedure: RIGHT HEART CATH;  Surgeon: Jettie Booze, MD;  Location: [facility];  Service: Cardiovascular;  Laterality: N/A;  . TEE WITHOUT CARDIOVERSION N/A 09/18/2017   Procedure: TRANSESOPHAGEAL ECHOCARDIOGRAM (TEE);  Surgeon: Fay Records, MD;  Location: [facility];  Service: Cardiovascular;  Laterality: N/A;  . TOTAL KNEE ARTHROPLASTY Bilateral   . TRANSURETHRAL RESECTION OF [place] History   Socioeconomic History  . Marital status: Married    Spouse name: Not on file  . Number of children: Not on file  . Years of education: Not on file  . Highest education level: Not on file  Occupational History  . Not on file  Tobacco Use  . Smoking status: Former Smoker    Packs/day: 2.00    Years: 30.00    Pack years: 60.00    Types: Cigarettes    Start date: 03/28/1953    Quit date: 03/29/1983    Years since quitting: 36.5  . Smokeless tobacco: Never Used  Vaping Use  . Vaping Use: Never used  Substance and Sexual Activity  . Alcohol use: Not Currently    Comment: 05/18/2017 'I drink a beer q yr"  . Drug use: No  . Sexual activity: Not Currently  Other Topics Concern  . Not on file  Social History Narrative   Patient lives at home with his spouse.    Social Determinants of Health   Financial Resource Strain: Medium Risk  . Difficulty of Paying Living Expenses: Somewhat hard  Food Insecurity: No Food Insecurity  . Worried About Charity fundraiser in the Last Year: Never true  . Ran Out of Food in the Last Year: Never true  Transportation Needs: No Transportation Needs  . Lack of Transportation (Medical): No  . Lack of Transportation (Non-Medical): No  Physical Activity:   . Days of Exercise per Week:   .  Minutes of Exercise per Session:   Stress: No Stress Concern Present  . Feeling of Stress : Not at all  Social Connections:   . Frequency of Communication with Friends and Family:   . Frequency of Social Gatherings with Friends and Family:   . Attends Religious Services:   . Active Member of Clubs or Organizations:   . Attends Archivist Meetings:   [name] Marital Status:   Intimate Partner Violence:   . Fear of Current or Ex-Partner:   . Emotionally Abused:   [name] Physically Abused:   . Sexually Abused:    Family History  Problem Relation Age of Onset  . Heart attack Father 39  . COPD Father   . COPD Mother   . Heart disease Mother   . Diabetes Mother   . Hypertension Sister   . CVA Sister   . Diabetes Sister   . Multiple sclerosis Sister     Current Outpatient Medications  Medication Sig Dispense Refill  . acetaminophen (TYLENOL) 500 MG tablet Take 1 tablet (500 mg total) by mouth every 6 (six) hours as needed for mild pain or headache. 30 tablet 0  . apixaban (ELIQUIS) 2.5 MG TABS tablet Take 1 tablet (2.5 mg total) by mouth 2 (two) times daily. 60 tablet 1  . atorvastatin (LIPITOR) 80 MG tablet Take 80 mg by mouth every evening.     . Cholecalciferol (VITAMIN D) 50 MCG (2000 UT) tablet Take 2,000 Units by mouth daily.    . Cyanocobalamin (B-12) 2500 MCG TABS Take 2,500 mcg by mouth daily.    . finasteride (PROSCAR) 5 MG tablet Take 1 tablet (5 mg total) by mouth daily. 30 tablet 1  . folic acid (FOLVITE) 1 MG tablet Take 1 tablet (1 mg total) by mouth daily. 30 tablet 1  . furosemide (LASIX) 40 MG tablet TAKE 1 TABLET DAILY - MAY TAKE AN EXTRA 20 MG AS NEEDED (DOSE CHANGE 04/01/19) 135 tablet 1  . insulin glargine (LANTUS) 100 UNIT/ML injection Inject 30-40 Units into the skin See admin instructions. Inject 30 units SQ in the morning and inject 40 units SQ at bedtime    . isosorbide mononitrate (IMDUR) 30 MG 24 hr tablet Take 1 tablet (30 mg total) by mouth daily. [place]  tablet 1  . lisinopril (ZESTRIL) 5 MG tablet Take 0.5 tablets (2.5 mg total) by mouth daily. (Patient taking differently: Take 5 mg by mouth daily. )    . meclizine (ANTIVERT) 25 MG tablet Take 25 mg by mouth daily as needed for dizziness.    . metFORMIN (GLUCOPHAGE) 1000 MG tablet Take 1,000 mg by mouth 2 (two) times daily.    . nitroGLYCERIN (NITROSTAT) 0.4 MG SL tablet PLACE 1 TABLET (0.4 MG TOTAL) UNDER THE TONGUE EVERY 5  MINUTES AS NEEDED FOR CHEST PAIN. (Patient taking differently: Place 0.4 mg under the tongue every 5 (five) minutes as needed for chest pain. ) 25 tablet 3  . pantoprazole (PROTONIX) 40 MG tablet Take 1 tablet (40 mg total) by mouth 2 (two) times daily. To protect stomach while taking multiple blood thinners. 60 tablet 2  . potassium chloride SA (K-DUR) 20 MEQ tablet Take 1 tablet (20 mEq total) by mouth daily. 30 tablet 1  . psyllium (METAMUCIL) 58.6 % packet Take 0.5 packets by mouth 2 (two) times daily.     . traZODone (DESYREL) 100 MG tablet Take 100 mg by mouth at bedtime.      No current facility-administered medications for this visit.    Allergies  Allergen Reactions  . Bactrim [Sulfamethoxazole-Trimethoprim]     Pancytopenia  . Codeine Shortness Of Breath  . Heparin Other (See Comments)    +HIT,  Severe bleeding (with heparin drip & large doses), tolerates low doses  . Losartan Swelling    Per ENT UNSPECIFIED REACTION   . Oxycodone Other (See Comments)    "Made me act out of my mind" Mental status changes hallucinations  . Feraheme [Ferumoxytol]     Diaphoretic, chest pain     REVIEW OF SYSTEMS:   [X]  denotes positive finding, [ ]  denotes negative finding Cardiac  Comments:  Chest pain or chest pressure:    Shortness of breath upon exertion:    Short of breath when lying flat:    Irregular heart rhythm:        Vascular    Pain in calf, thigh, or hip brought on by ambulation:    Pain in feet at night that wakes you up from your sleep:       Blood clot in your veins:    Leg swelling:  x  right foot      Pulmonary    Oxygen at home:    Productive cough:     Wheezing:         Neurologic    Sudden weakness in arms or legs:     Sudden numbness in arms or legs:     Sudden onset of difficulty speaking or slurred speech:    Temporary loss of vision in one eye:     Problems with dizziness:         Gastrointestinal    Blood in stool:     Vomited blood:         Genitourinary    Burning when urinating:     Blood in urine:        Psychiatric    Major depression:         Hematologic    Bleeding problems:    Problems with blood clotting too easily:        Skin    Rashes or ulcers:        Constitutional    Fever or chills:      PHYSICAL EXAMINATION:  Vitals:  09/25/19 1345  BP: 118/68  Pulse: 79  Resp: 18  SpO2: 97%   General:  WDWN in NAD; vital signs documented above Gait: Steady, no ataxia, with cane HENT: WNL, normocephalic Pulmonary: normal non-labored breathing Cardiac: regular HR, rhythym  Skin: without rashes Vascular Exam/Pulses: 2+ bilateral femoral pulses Extremities: with ischemic changes, with Gangrene , with cellulitis; without open wounds;  Musculoskeletal: no muscle wasting or atrophy  Neurologic: A&O X 3;  No focal weakness or paresthesias are detected Psychiatric:  The pt has Normal affect.     Arteriogram dated September 15, 2017 Impression:               #1  Short segment occlusion of the peroneal artery successfully crossed and treated with a 3 mm balloon with no residual stenosis.               #2  Multiple high-grade, greater than 90% areas of stenosis throughout the posterior tibial artery across the ankle.  I treated the posterior tibial artery out across the ankle with a 2 mm balloon and the remaining portion of the posterior tibial artery with a 3 mm balloon with no residual stenosis.               #3  Tibial disease on the right.  The patient will be brought back in  approximately 1 week for intervention of the right posterior tibial artery  Patient then underwent right PT angioplasty  Non-Invasive Vascular Imaging:   Right lower extremity duplex and ABIs done on May 27, 2019  focal velocity elevation of 212 cm/s was obtained at proximal SFA with post stenotic turbulence with a VR of 2.7. Findings are characteristic of 50-74% stenosis. A 2nd focal velocity elevation was visualized, measuring 160 cm/s at mid SFA. Findings are characteristic of 30-49% stenosis.  ABI/TBIToday's ABIToday's TBIPrevious ABIPrevious TBI  +-------+-----------+-----------+------------+------------+  Right 0.82    amputation               +-------+-----------+-----------+------------+------------+  Left  0.93    amputation               +-------+-----------+-----------+------------+------------+  Biphasic waveforms bilaterally  ASSESSMENT/PLAN:: 86 y.o. male here for evaluation of right 4th toe.  Gangrene of distal 4th toe phalanx and increased warmth of dorsum of right foot.  Recommend toe amp, antibiotics.  We will make arrangements for toe amputation tomorrow with Dr. Trula Slade.  The patient will hold his Eliquis today and tomorrow.  Depending upon operative findings, will possibly proceed with aortogram with right lower extremity runoff with or without intervention on Tuesday.  I explained this to the patient and his wife and they agree with this plan.  Barbie Banner, PA-C [facility] [phone]  Clinic MD: Oneida Alar

## 2019-09-25 NOTE — Progress Notes (Signed)
Dr. Fransisco Beau, Anesthesiology, reviewed pt history. No new orders.

## 2019-09-25 NOTE — Progress Notes (Addendum)
Pt denies SOB and chest pain. Pt stated that he is under the care of Dr. [name] , Cardiology and Dr. Manuella Ghazi, PCP. Pt stated that last dose of Eliquis was this morning as instructed by MD. Pt made aware to stop taking vitamins, fish oil and herbal medications. Do not take any NSAIDs ie: Ibuprofen, Advil, Naproxen (Aleve), Motrin, BC and Goody Powder. Pt made aware to take 20 units of Lantus insulin at HS . Pt made aware to take 15 units of Lantus insulin DOS if CBG is > 70. Pt made aware to check CBG every 2 hours prior to arrival to hospital on DOS. Pt made aware to hold Metformin on DOS . Pt made aware to hold Lantus if CBG < 70. Pt made aware to treat a CBG < 70 with  4 ounces of apple or cranberry juice, wait 15 minutes after intervention to recheck CBG, if CBG remains < 70, call Short Stay unit to speak with a nurse. Pt reminded to quarantine. Pt verbalized understanding of all pre-op instructions.

## 2019-09-26 ENCOUNTER — Other Ambulatory Visit: Payer: Self-pay

## 2019-09-26 ENCOUNTER — Ambulatory Visit (HOSPITAL_COMMUNITY): Payer: Medicare HMO | Admitting: Anesthesiology

## 2019-09-26 ENCOUNTER — Encounter (HOSPITAL_COMMUNITY)
Admission: RE | Disposition: A | Discharge status: Home / Self Care | Payer: Self-pay | Source: Home / Self Care | Attending: Surgery

## 2019-09-26 ENCOUNTER — Encounter (HOSPITAL_COMMUNITY): Payer: Self-pay | Admitting: Surgery

## 2019-09-26 ENCOUNTER — Ambulatory Visit (HOSPITAL_COMMUNITY)
Admission: RE | Admit: 2019-09-26 | Discharge: 2019-09-26 | Disposition: A | Discharge status: Home / Self Care | Payer: Medicare HMO | Attending: Surgery | Admitting: Surgery

## 2019-09-26 DIAGNOSIS — I2510 Atherosclerotic heart disease of native coronary artery without angina pectoris: Secondary | ICD-10-CM | POA: Diagnosis not present

## 2019-09-26 DIAGNOSIS — Z881 Allergy status to other antibiotic agents status: Secondary | ICD-10-CM | POA: Insufficient documentation

## 2019-09-26 DIAGNOSIS — Z89412 Acquired absence of left great toe: Secondary | ICD-10-CM | POA: Diagnosis not present

## 2019-09-26 DIAGNOSIS — Z955 Presence of coronary angioplasty implant and graft: Secondary | ICD-10-CM | POA: Insufficient documentation

## 2019-09-26 DIAGNOSIS — I5032 Chronic diastolic (congestive) heart failure: Secondary | ICD-10-CM | POA: Diagnosis not present

## 2019-09-26 DIAGNOSIS — Z888 Allergy status to other drugs, medicaments and biological substances status: Secondary | ICD-10-CM | POA: Insufficient documentation

## 2019-09-26 DIAGNOSIS — Z96653 Presence of artificial knee joint, bilateral: Secondary | ICD-10-CM | POA: Diagnosis not present

## 2019-09-26 DIAGNOSIS — J449 Chronic obstructive pulmonary disease, unspecified: Secondary | ICD-10-CM | POA: Insufficient documentation

## 2019-09-26 DIAGNOSIS — Z794 Long term (current) use of insulin: Secondary | ICD-10-CM | POA: Diagnosis not present

## 2019-09-26 DIAGNOSIS — Z79899 Other long term (current) drug therapy: Secondary | ICD-10-CM | POA: Diagnosis not present

## 2019-09-26 DIAGNOSIS — I96 Gangrene, not elsewhere classified: Secondary | ICD-10-CM | POA: Diagnosis not present

## 2019-09-26 DIAGNOSIS — Z89411 Acquired absence of right great toe: Secondary | ICD-10-CM | POA: Insufficient documentation

## 2019-09-26 DIAGNOSIS — Z7901 Long term (current) use of anticoagulants: Secondary | ICD-10-CM | POA: Diagnosis not present

## 2019-09-26 DIAGNOSIS — I739 Peripheral vascular disease, unspecified: Secondary | ICD-10-CM | POA: Diagnosis not present

## 2019-09-26 DIAGNOSIS — K219 Gastro-esophageal reflux disease without esophagitis: Secondary | ICD-10-CM | POA: Insufficient documentation

## 2019-09-26 DIAGNOSIS — I110 Hypertensive heart disease with heart failure: Secondary | ICD-10-CM | POA: Insufficient documentation

## 2019-09-26 DIAGNOSIS — E1152 Type 2 diabetes mellitus with diabetic peripheral angiopathy with gangrene: Secondary | ICD-10-CM | POA: Insufficient documentation

## 2019-09-26 DIAGNOSIS — E782 Mixed hyperlipidemia: Secondary | ICD-10-CM | POA: Insufficient documentation

## 2019-09-26 DIAGNOSIS — G4730 Sleep apnea, unspecified: Secondary | ICD-10-CM | POA: Insufficient documentation

## 2019-09-26 DIAGNOSIS — Z885 Allergy status to narcotic agent status: Secondary | ICD-10-CM | POA: Diagnosis not present

## 2019-09-26 DIAGNOSIS — Z87891 Personal history of nicotine dependence: Secondary | ICD-10-CM | POA: Insufficient documentation

## 2019-09-26 DIAGNOSIS — M1990 Unspecified osteoarthritis, unspecified site: Secondary | ICD-10-CM | POA: Insufficient documentation

## 2019-09-26 DIAGNOSIS — L97519 Non-pressure chronic ulcer of other part of right foot with unspecified severity: Secondary | ICD-10-CM | POA: Diagnosis not present

## 2019-09-26 DIAGNOSIS — I480 Paroxysmal atrial fibrillation: Secondary | ICD-10-CM | POA: Diagnosis not present

## 2019-09-26 DIAGNOSIS — L02611 Cutaneous abscess of right foot: Secondary | ICD-10-CM | POA: Diagnosis not present

## 2019-09-26 DIAGNOSIS — Z20822 Contact with and (suspected) exposure to covid-19: Secondary | ICD-10-CM | POA: Diagnosis not present

## 2019-09-26 HISTORY — DX: Presence of spectacles and contact lenses: Z97.3

## 2019-09-26 HISTORY — DX: Unspecified chorioretinal inflammation, unspecified eye: H30.90

## 2019-09-26 HISTORY — PX: AMPUTATION: SHX166

## 2019-09-26 LAB — GLUCOSE, CAPILLARY
Glucose-Capillary: 137 mg/dL — ABNORMAL HIGH (ref 70–99)
Glucose-Capillary: 142 mg/dL — ABNORMAL HIGH (ref 70–99)

## 2019-09-26 LAB — BASIC METABOLIC PANEL
Anion gap: 10 (ref 5–15)
BUN: 20 mg/dL (ref 8–23)
CO2: 28 mmol/L (ref 22–32)
Calcium: 9 mg/dL (ref 8.9–10.3)
Chloride: 100 mmol/L (ref 98–111)
Creatinine, Ser: 1.37 mg/dL — ABNORMAL HIGH (ref 0.61–1.24)
GFR calc Af Amer: 55 mL/min — ABNORMAL LOW (ref >60)
GFR calc non Af Amer: 47 mL/min — ABNORMAL LOW (ref >60)
Glucose, Bld: 150 mg/dL — ABNORMAL HIGH (ref 70–99)
Potassium: 3.8 mmol/L (ref 3.5–5.1)
Sodium: 138 mmol/L (ref 135–145)

## 2019-09-26 LAB — CBC
HCT: 27.1 % — ABNORMAL LOW (ref 39.0–52.0)
Hemoglobin: 8.3 g/dL — ABNORMAL LOW (ref 13.0–17.0)
MCH: 26 pg (ref 26.0–34.0)
MCHC: 30.6 g/dL (ref 30.0–36.0)
MCV: 85 fL (ref 80.0–100.0)
Platelets: 260 10*3/uL (ref 150–400)
RBC: 3.19 MIL/uL — ABNORMAL LOW (ref 4.22–5.81)
RDW: 19.9 % — ABNORMAL HIGH (ref 11.5–15.5)
WBC: 7.8 10*3/uL (ref 4.0–10.5)
nRBC: 0 % (ref 0.0–0.2)

## 2019-09-26 LAB — SURGICAL PCR SCREEN
MRSA, PCR: POSITIVE — AB
Staphylococcus aureus: POSITIVE — AB

## 2019-09-26 LAB — PROTIME-INR
INR: 1.3 — ABNORMAL HIGH (ref 0.8–1.2)
Prothrombin Time: 15.3 seconds — ABNORMAL HIGH (ref 11.4–15.2)

## 2019-09-26 LAB — SARS CORONAVIRUS 2 BY RT PCR (HOSPITAL ORDER, PERFORMED IN ~~LOC~~ HOSPITAL LAB): SARS Coronavirus 2: NEGATIVE

## 2019-09-26 SURGERY — AMPUTATION DIGIT
Anesthesia: General | Site: Fourth Toe | Laterality: Right

## 2019-09-26 MED ORDER — CEFAZOLIN SODIUM 1 G IJ SOLR
INTRAMUSCULAR | Status: AC
  Filled 2019-09-26: qty 20

## 2019-09-26 MED ORDER — ACETAMINOPHEN 500 MG PO TABS
1000.0000 mg | ORAL_TABLET | Freq: Once | ORAL | Status: AC
  Administered 2019-09-26: 500 mg via ORAL
  Filled 2019-09-26: qty 2

## 2019-09-26 MED ORDER — CHLORHEXIDINE GLUCONATE 0.12 % MT SOLN
15.0000 mL | Freq: Once | OROMUCOSAL | Status: AC
  Administered 2019-09-26: 15 mL via OROMUCOSAL
  Filled 2019-09-26: qty 15

## 2019-09-26 MED ORDER — FENTANYL CITRATE (PF) 100 MCG/2ML IJ SOLN: 25.0000 ug | INTRAMUSCULAR | Status: DC | PRN

## 2019-09-26 MED ORDER — APIXABAN 2.5 MG PO TABS: 2.5000 mg | ORAL_TABLET | Freq: Two times a day (BID) | ORAL | Status: DC

## 2019-09-26 MED ORDER — EPHEDRINE SULFATE-NACL 50-0.9 MG/10ML-% IV SOSY
PREFILLED_SYRINGE | INTRAVENOUS | Status: DC | PRN
  Administered 2019-09-26: 15 mg via INTRAVENOUS
  Administered 2019-09-26: 10 mg via INTRAVENOUS
  Administered 2019-09-26: 15 mg via INTRAVENOUS
  Administered 2019-09-26: 10 mg via INTRAVENOUS
  Administered 2019-09-26 (×2): 15 mg via INTRAVENOUS

## 2019-09-26 MED ORDER — LIDOCAINE 2% (20 MG/ML) 5 ML SYRINGE
INTRAMUSCULAR | Status: DC | PRN
  Administered 2019-09-26: 60 mg via INTRAVENOUS

## 2019-09-26 MED ORDER — DOXYCYCLINE HYCLATE 100 MG PO CAPS: 100.0000 mg | ORAL_CAPSULE | Freq: Two times a day (BID) | ORAL | 0 refills | Status: DC

## 2019-09-26 MED ORDER — EPHEDRINE 5 MG/ML INJ
INTRAVENOUS | Status: AC
  Filled 2019-09-26: qty 10

## 2019-09-26 MED ORDER — FENTANYL CITRATE (PF) 250 MCG/5ML IJ SOLN
INTRAMUSCULAR | Status: AC
  Filled 2019-09-26: qty 5

## 2019-09-26 MED ORDER — PHENYLEPHRINE 40 MCG/ML (10ML) SYRINGE FOR IV PUSH (FOR BLOOD PRESSURE SUPPORT)
PREFILLED_SYRINGE | INTRAVENOUS | Status: AC
  Filled 2019-09-26: qty 10

## 2019-09-26 MED ORDER — ONDANSETRON HCL 4 MG/2ML IJ SOLN
INTRAMUSCULAR | Status: AC
  Filled 2019-09-26: qty 2

## 2019-09-26 MED ORDER — LIDOCAINE HCL 1 % IJ SOLN
INTRAMUSCULAR | Status: AC
  Filled 2019-09-26: qty 20

## 2019-09-26 MED ORDER — TRAMADOL HCL 50 MG PO TABS: 50.0000 mg | ORAL_TABLET | Freq: Three times a day (TID) | ORAL | 0 refills | Status: DC | PRN

## 2019-09-26 MED ORDER — LIDOCAINE 2% (20 MG/ML) 5 ML SYRINGE
INTRAMUSCULAR | Status: AC
  Filled 2019-09-26: qty 5

## 2019-09-26 MED ORDER — LIDOCAINE HCL (PF) 1 % IJ SOLN
INTRAMUSCULAR | Status: DC | PRN
  Administered 2019-09-26: 10 mL

## 2019-09-26 MED ORDER — ORAL CARE MOUTH RINSE: 15.0000 mL | Freq: Once | OROMUCOSAL | Status: AC

## 2019-09-26 MED ORDER — PROPOFOL 10 MG/ML IV BOLUS
INTRAVENOUS | Status: DC | PRN
  Administered 2019-09-26: 100 mg via INTRAVENOUS

## 2019-09-26 MED ORDER — CEFAZOLIN SODIUM-DEXTROSE 2-3 GM-%(50ML) IV SOLR
INTRAVENOUS | Status: DC | PRN
  Administered 2019-09-26: 2 g via INTRAVENOUS

## 2019-09-26 MED ORDER — LACTATED RINGERS IV SOLN: INTRAVENOUS | Status: DC

## 2019-09-26 MED ORDER — DEXAMETHASONE SODIUM PHOSPHATE 10 MG/ML IJ SOLN
INTRAMUSCULAR | Status: DC | PRN
  Administered 2019-09-26: 5 mg via INTRAVENOUS

## 2019-09-26 MED ORDER — PHENYLEPHRINE 40 MCG/ML (10ML) SYRINGE FOR IV PUSH (FOR BLOOD PRESSURE SUPPORT)
PREFILLED_SYRINGE | INTRAVENOUS | Status: DC | PRN
  Administered 2019-09-26: 80 ug via INTRAVENOUS
  Administered 2019-09-26: 120 ug via INTRAVENOUS
  Administered 2019-09-26 (×2): 80 ug via INTRAVENOUS
  Administered 2019-09-26: 200 ug via INTRAVENOUS

## 2019-09-26 MED ORDER — PHENYLEPHRINE HCL-NACL 10-0.9 MG/250ML-% IV SOLN
INTRAVENOUS | Status: DC | PRN
  Administered 2019-09-26: 50 ug/min via INTRAVENOUS

## 2019-09-26 MED ORDER — DEXAMETHASONE SODIUM PHOSPHATE 10 MG/ML IJ SOLN
INTRAMUSCULAR | Status: AC
  Filled 2019-09-26: qty 1

## 2019-09-26 MED ORDER — ONDANSETRON HCL 4 MG/2ML IJ SOLN
INTRAMUSCULAR | Status: DC | PRN
  Administered 2019-09-26: 4 mg via INTRAVENOUS

## 2019-09-26 MED ORDER — PROPOFOL 10 MG/ML IV BOLUS
INTRAVENOUS | Status: AC
  Filled 2019-09-26: qty 20

## 2019-09-26 MED ORDER — 0.9 % SODIUM CHLORIDE (POUR BTL) OPTIME
TOPICAL | Status: DC | PRN
  Administered 2019-09-26: 1000 mL

## 2019-09-26 MED ORDER — FENTANYL CITRATE (PF) 100 MCG/2ML IJ SOLN
INTRAMUSCULAR | Status: DC | PRN
  Administered 2019-09-26: 50 ug via INTRAVENOUS

## 2019-09-26 SURGICAL SUPPLY — 37 items
BLADE AVERAGE 25MMX9MM (BLADE) ×1
BLADE AVERAGE 25X9 (BLADE) ×2 IMPLANT
BLADE SAW SGTL 81X20 HD (BLADE) IMPLANT
BNDG ELASTIC 4X5.8 VLCR STR LF (GAUZE/BANDAGES/DRESSINGS) ×3 IMPLANT
BNDG GAUZE ELAST 4 BULKY (GAUZE/BANDAGES/DRESSINGS) ×3 IMPLANT
CANISTER SUCT 3000ML PPV (MISCELLANEOUS) ×3 IMPLANT
CLIP VESOCCLUDE MED 6/CT (CLIP) ×3 IMPLANT
COVER SURGICAL LIGHT HANDLE (MISCELLANEOUS) ×3 IMPLANT
COVER WAND RF STERILE (DRAPES) ×3 IMPLANT
DRAPE EXTREMITY T 121X128X90 (DISPOSABLE) ×3 IMPLANT
DRAPE HALF SHEET 40X57 (DRAPES) ×3 IMPLANT
DRSG EMULSION OIL 3X3 NADH (GAUZE/BANDAGES/DRESSINGS) ×3 IMPLANT
ELECT REM PT RETURN 9FT ADLT (ELECTROSURGICAL) ×3
ELECTRODE REM PT RTRN 9FT ADLT (ELECTROSURGICAL) ×1 IMPLANT
GAUZE SPONGE 4X4 12PLY STRL (GAUZE/BANDAGES/DRESSINGS) ×3 IMPLANT
GAUZE SPONGE 4X4 12PLY STRL LF (GAUZE/BANDAGES/DRESSINGS) ×3 IMPLANT
GLOVE BIOGEL PI IND STRL 7.5 (GLOVE) ×1 IMPLANT
GLOVE BIOGEL PI INDICATOR 7.5 (GLOVE) ×2
GLOVE SURG SS PI 7.5 STRL IVOR (GLOVE) ×3 IMPLANT
GOWN STRL REUS W/ TWL LRG LVL3 (GOWN DISPOSABLE) ×2 IMPLANT
GOWN STRL REUS W/ TWL XL LVL3 (GOWN DISPOSABLE) ×1 IMPLANT
GOWN STRL REUS W/TWL LRG LVL3 (GOWN DISPOSABLE) ×6
GOWN STRL REUS W/TWL XL LVL3 (GOWN DISPOSABLE) ×3
KIT BASIN OR (CUSTOM PROCEDURE TRAY) ×3 IMPLANT
KIT TURNOVER KIT B (KITS) ×3 IMPLANT
MARKER SKIN DUAL TIP RULER LAB (MISCELLANEOUS) ×3 IMPLANT
NEEDLE 22X1 1/2 (OR ONLY) (NEEDLE) ×3 IMPLANT
NEEDLE HYPO 25GX1X1/2 BEV (NEEDLE) IMPLANT
NS IRRIG 1000ML POUR BTL (IV SOLUTION) ×3 IMPLANT
PACK GENERAL/GYN (CUSTOM PROCEDURE TRAY) ×3 IMPLANT
PAD ARMBOARD 7.5X6 YLW CONV (MISCELLANEOUS) ×6 IMPLANT
SUT ETHILON 2 0 PSLX (SUTURE) ×3 IMPLANT
SUT ETHILON 3 0 PS 1 (SUTURE) ×3 IMPLANT
SYR CONTROL 10ML LL (SYRINGE) IMPLANT
TOWEL GREEN STERILE (TOWEL DISPOSABLE) ×6 IMPLANT
UNDERPAD 30X36 HEAVY ABSORB (UNDERPADS AND DIAPERS) ×3 IMPLANT
WATER STERILE IRR 1000ML POUR (IV SOLUTION) ×3 IMPLANT

## 2019-09-26 NOTE — Anesthesia Preprocedure Evaluation (Addendum)
Anesthesia Evaluation  Patient identified by MRN, date of birth, ID band Patient awake    Reviewed: Allergy & Precautions, NPO status , Patient's Chart, lab work & pertinent test results  Airway Mallampati: II  TM Distance: >3 FB     Dental  (+) Edentulous Upper, Upper Dentures   Pulmonary shortness of breath, sleep apnea (does not use CPAP) , COPD, former smoker,    breath sounds clear to auscultation       Cardiovascular hypertension, + CAD, + Cardiac Stents (04/2017 s/p DES to D1, DES to prox-mid LAD, DES to prox LAD overlapping the prior stent), + Peripheral Vascular Disease and +CHF  + dysrhythmias Atrial Fibrillation  Rhythm:Regular Rate:Normal  TEE 2019 Normal EF, valves ok  LHC 2019 Dist RCA lesion is 40% stenosed. Prox RCA lesion is 25% stenosed. 1st Diag lesion is 75% stenosed. A drug-eluting stent was successfully placed using a STENT SYNERGY DES 2.25X12. Post intervention, there is a 0% residual stenosis. Prox LAD to Mid LAD lesion is 75% stenosed. A drug-eluting stent was successfully placed using a STENT SYNERGY DES 3.5X38. Post intervention, there is a 0% residual stenosis. Prox LAD lesion is 70% stenosed. A drug-eluting stent was successfully placed using a STENT SYNERGY DES 4X12, overlapping the prior stent. Post intervention, there is a 0% residual stenosis. The left ventricular systolic function is normal. The left ventricular ejection fraction is 55-65% by visual estimate. LV end diastolic pressure is normal. There is no aortic valve stenosis. Ao sat 98%, PA sat 65%; PA mean 25 mm Hg; unable to wedge the catheter. Normal right heat pressures. Tortuous right subclavian making catheter navigation difficult from the right radial approach.    Neuro/Psych    GI/Hepatic hiatal hernia, GERD  ,  Endo/Other  diabetes, Oral Hypoglycemic AgentsHypothyroidism   Renal/GU Renal disease      Musculoskeletal  (+) Arthritis ,   Abdominal   Peds  Hematology  (+) Blood dyscrasia (on eliquis), anemia ,   Anesthesia Other Findings   Reproductive/Obstetrics                            Anesthesia Physical  Anesthesia Plan  ASA: III  Anesthesia Plan: General   Post-op Pain Management:    Induction: Intravenous  PONV Risk Score and Plan: 2 and Ondansetron, Dexamethasone and Treatment may vary due to age or medical condition  Airway Management Planned: LMA  Additional Equipment:   Intra-op Plan:   Post-operative Plan:   Informed Consent: I have reviewed the patients History and Physical, chart, labs and discussed the procedure including the risks, benefits and alternatives for the proposed anesthesia with the patient or authorized representative who has indicated his/her understanding and acceptance.     Dental advisory given  Plan Discussed with: Anesthesiologist  Anesthesia Plan Comments: (   )        Anesthesia Quick Evaluation

## 2019-09-26 NOTE — Transfer of Care (Signed)
Immediate Anesthesia Transfer of Care Note  Patient: Mike Wright  Procedure(s) Performed: AMPUTATION RIGHT FOURTH TOE AND FIFTH TOES (Right Fourth Toe)  Patient Location: PACU  Anesthesia Type:General  Level of Consciousness: awake  Airway & Oxygen Therapy: Patient Spontanous Breathing  Post-op Assessment: Report given to RN and Post -op Vital signs reviewed and stable  Post vital signs: Reviewed and stable  Last Vitals:  Vitals Value Taken Time  BP 120/65 09/26/19 1229  Temp 36.2 C 09/26/19 1114  Pulse 81 09/26/19 1230  Resp 21 09/26/19 1230  SpO2 89 % 09/26/19 1230  Vitals shown include unvalidated device data.  Last Pain:  Vitals:   09/26/19 1214  TempSrc:   PainSc: 0-No pain         Complications: No complications documented.

## 2019-09-26 NOTE — Progress Notes (Signed)
Pt was being fitted for new Darco post-op shoe when this RN noted blood saturating the ace bandage on the pt. After removing the bandage and dressing, bleeding was noted from the surgical site. [facility]'s PA Catalina Antigua was contacted and he said to hold pressure on site and reapply the dressing with more reinforcement, bleeding was to be expected. Will continue to monitor-- pt is resting comfortably A&O x4 and asymptomatic.

## 2019-09-26 NOTE — Anesthesia Procedure Notes (Signed)
Procedure Name: LMA Insertion Date/Time: 09/26/2019 10:26 AM Performed by: Lieutenant Diego, CRNA Pre-anesthesia Checklist: Patient identified, Emergency Drugs available, Suction available and Patient being monitored Patient Re-evaluated:Patient Re-evaluated prior to induction Oxygen Delivery Method: Circle system utilized Preoxygenation: Pre-oxygenation with 100% oxygen Induction Type: IV induction Ventilation: Mask ventilation without difficulty LMA: LMA inserted LMA Size: 4.0 Number of attempts: 1 Placement Confirmation: positive ETCO2 and breath sounds checked- equal and bilateral Tube secured with: Tape Dental Injury: Teeth and Oropharynx as per pre-operative assessment

## 2019-09-26 NOTE — Op Note (Signed)
    Patient name: Mike Wright MRN: [id number] DOB: [birth year] Sex: male  09/26/2019 Pre-operative Diagnosis: Changes to right fourth toe Post-operative diagnosis:  Same Surgeon:  [name] Assistants: None Procedure:   Amputation of right fourth and fifth toe including metatarsal head Anesthesia: General Blood Loss: Minimal Specimens: None  Findings: No obvious infection at the amputation level.  There is good capillary bleeding.  Indications: The patient has previously undergone amputation of right toes 1 through 3 with good healing.  He recently developed ischemic changes to the fourth toe.  He comes in today for amputation of the fourth and fifth digit.  Procedure:  The patient was identified in the holding area and taken to [place] 11  The patient was then placed supine on the table. general anesthesia was administered.  The patient was prepped and draped in the usual sterile fashion.  A time out was called and antibiotics were administered.  A fishmouth type incision was made incorporating the fourth and fifth toe.  Incision was taken down to the bone with a 10 blade and bone cutters were used to transect the bone at the level of the metatarsal head on the fourth and fifth toes.  Rongeurs were used to remove the metatarsal head on toes 4 and 5.  The bone edges were smoothed with a rongeur.  The wound was irrigated.  Hemostasis was achieved.  The incision was closed with interrupted 2-0 nylon mattress suture.  Sterile dressings were applied.   Disposition: To PACU stable.   Theotis Burrow, M.D., [facility] [place] Office: [phone] Pager:  [phone]

## 2019-09-26 NOTE — Interval H&P Note (Signed)
History and Physical Interval Note:  09/26/2019 9:41 AM  Mike Wright  has presented today for surgery, with the diagnosis of GANGRENE OF RIGHT TOES.  The various methods of treatment have been discussed with the patient and family. After consideration of risks, benefits and other options for treatment, the patient has consented to  Procedure(s): AMPUTATION RIGHT FOURTH TOE (Right) as a surgical intervention.  The patient's history has been reviewed, patient examined, no change in status, stable for surgery.  I have reviewed the patient's chart and labs.  Questions were answered to the patient's satisfaction.     [place] for amputation of right 4th qnd 5th toe.  All questions answered.  WB

## 2019-09-26 NOTE — Progress Notes (Signed)
Orthopedic Tech Progress Note Patient Details:  MAYKEL REITTER 1933-11-22 [id number]  Ortho Devices Type of Ortho Device: Darco shoe Ortho Device/Splint Location: Right Lower Extremity Ortho Device/Splint Interventions: Ordered   Post Interventions Patient Tolerated: Well Instructions Provided: Adjustment of device, Care of device, Poper ambulation with device  Delivered DARCO shoe to patient, did not apply.  Tammy Sours 09/26/2019, 12:03 PM

## 2019-09-27 ENCOUNTER — Encounter (HOSPITAL_COMMUNITY): Payer: Self-pay | Admitting: Surgery

## 2019-09-27 LAB — SURGICAL PATHOLOGY

## 2019-09-27 NOTE — Anesthesia Postprocedure Evaluation (Signed)
Anesthesia Post Note  Patient: Mike Wright  Procedure(s) Performed: AMPUTATION RIGHT FOURTH TOE AND FIFTH TOES (Right Fourth Toe)     Patient location during evaluation: PACU Anesthesia Type: General Level of consciousness: awake and alert Pain management: pain level controlled Vital Signs Assessment: post-procedure vital signs reviewed and stable Respiratory status: spontaneous breathing, nonlabored ventilation, respiratory function stable and patient connected to nasal cannula oxygen Cardiovascular status: blood pressure returned to baseline and stable Postop Assessment: no apparent nausea or vomiting Anesthetic complications: no   No complications documented.  Last Vitals:  Vitals:   09/26/19 1214 09/26/19 1229  BP: 137/62 120/65  Pulse: 91 60  Resp: 20 20  Temp:  36.7 C  SpO2: 94% 100%    Last Pain:  Vitals:   09/26/19 1229  TempSrc:   PainSc: 0-No pain                 Archer Vise L Isom Kochan

## 2019-09-30 DIAGNOSIS — Z89421 Acquired absence of other right toe(s): Secondary | ICD-10-CM | POA: Diagnosis not present

## 2019-09-30 DIAGNOSIS — T8789 Other complications of amputation stump: Secondary | ICD-10-CM | POA: Diagnosis not present

## 2019-09-30 DIAGNOSIS — Z7901 Long term (current) use of anticoagulants: Secondary | ICD-10-CM | POA: Diagnosis not present

## 2019-10-01 DIAGNOSIS — J9611 Chronic respiratory failure with hypoxia: Secondary | ICD-10-CM | POA: Diagnosis not present

## 2019-10-01 DIAGNOSIS — G4733 Obstructive sleep apnea (adult) (pediatric): Secondary | ICD-10-CM | POA: Diagnosis not present

## 2019-10-01 DIAGNOSIS — Z89421 Acquired absence of other right toe(s): Secondary | ICD-10-CM | POA: Diagnosis not present

## 2019-10-01 DIAGNOSIS — E1151 Type 2 diabetes mellitus with diabetic peripheral angiopathy without gangrene: Secondary | ICD-10-CM | POA: Diagnosis not present

## 2019-10-01 DIAGNOSIS — I70203 Unspecified atherosclerosis of native arteries of extremities, bilateral legs: Secondary | ICD-10-CM | POA: Diagnosis not present

## 2019-10-01 DIAGNOSIS — Z89411 Acquired absence of right great toe: Secondary | ICD-10-CM | POA: Diagnosis not present

## 2019-10-01 DIAGNOSIS — Z794 Long term (current) use of insulin: Secondary | ICD-10-CM | POA: Diagnosis not present

## 2019-10-01 DIAGNOSIS — Z89412 Acquired absence of left great toe: Secondary | ICD-10-CM | POA: Diagnosis not present

## 2019-10-01 DIAGNOSIS — Z6828 Body mass index (BMI) 28.0-28.9, adult: Secondary | ICD-10-CM | POA: Diagnosis not present

## 2019-10-07 ENCOUNTER — Telehealth: Payer: Self-pay | Admitting: Cardiology

## 2019-10-07 MED ORDER — FUROSEMIDE 40 MG PO TABS: ORAL_TABLET | ORAL | 6 refills | Status: DC

## 2019-10-07 NOTE — Telephone Encounter (Signed)
Done

## 2019-10-07 NOTE — Telephone Encounter (Signed)
     1. Which medications need to be refilled? (please list name of each medication and dose if known)  Lasix  2. Which pharmacy/location (including street and city if local pharmacy) is medication to be sent to? [facility]  3. Do they need a 30 day or 90 day supply? [place]

## 2019-10-09 ENCOUNTER — Ambulatory Visit (INDEPENDENT_AMBULATORY_CARE_PROVIDER_SITE_OTHER): Payer: Medicare HMO | Admitting: Surgery

## 2019-10-09 ENCOUNTER — Other Ambulatory Visit: Payer: Self-pay

## 2019-10-09 ENCOUNTER — Encounter: Payer: Medicare HMO | Admitting: Surgery

## 2019-10-09 ENCOUNTER — Encounter: Payer: Self-pay | Admitting: Surgery

## 2019-10-09 VITALS — BP 114/61 | HR 76 | Temp 97.3°F | Resp 20 | Ht 67.0 in | Wt 191.0 lb

## 2019-10-09 DIAGNOSIS — I96 Gangrene, not elsewhere classified: Secondary | ICD-10-CM

## 2019-10-09 MED ORDER — CEPHALEXIN 500 MG PO CAPS
500.0000 mg | ORAL_CAPSULE | Freq: Three times a day (TID) | ORAL | 0 refills | Status: DC
Start: 2019-10-09 — End: 2020-01-21

## 2019-10-09 NOTE — Progress Notes (Signed)
Patient name: Mike Wright MRN: [id number] DOB: 13-Apr-1933 Sex: male  REASON FOR VISIT:     post op  HISTORY OF PRESENT ILLNESS:   Mike Wright is a 86 y.o. male who has undergone bilateral tibial angioplasty in 2019 with what initially was bilateral great toe ulcers requiring amputation.  These have healed.  He required amputation of his right second toe for a new ulcer, followed by infection in his right third toe which required amputation.  He recently presented with an ischemic fourth right toe and on 09/26/2019 he underwent amputation of the fourth and fifth toe he is back for follow-up.  CURRENT MEDICATIONS:    Current Outpatient Medications  Medication Sig Dispense Refill  . acetaminophen (TYLENOL) 500 MG tablet Take 1 tablet (500 mg total) by mouth every 6 (six) hours as needed for mild pain or headache. 30 tablet 0  . atorvastatin (LIPITOR) 80 MG tablet Take 80 mg by mouth every evening.     . Cholecalciferol (VITAMIN D) 50 MCG (2000 UT) tablet Take 2,000 Units by mouth daily.    . Cyanocobalamin (B-12) 2500 MCG TABS Take 2,500 mcg by mouth daily.    [name] ELIQUIS 5 MG TABS tablet Take 5 mg by mouth 2 (two) times daily.    . folic acid (FOLVITE) 1 MG tablet Take 1 tablet (1 mg total) by mouth daily. 30 tablet 1  . furosemide (LASIX) 40 MG tablet TAKE 1 TABLET DAILY - MAY TAKE AN EXTRA 20 MG AS NEEDED (DOSE CHANGE 04/01/19) 45 tablet 6  . insulin glargine (LANTUS) 100 UNIT/ML injection Inject 30-40 Units into the skin See admin instructions. Inject 30 units SQ in the morning and inject 40 units SQ at bedtime    . isosorbide mononitrate (IMDUR) 30 MG 24 hr tablet Take 1 tablet (30 mg total) by mouth daily. 90 tablet 1  . lisinopril (ZESTRIL) 5 MG tablet Take 0.5 tablets (2.5 mg total) by mouth daily. (Patient taking differently: Take 5 mg by mouth daily. )    . meclizine (ANTIVERT) 25 MG tablet Take 25 mg by mouth daily as needed for dizziness.    .  metFORMIN (GLUCOPHAGE) 1000 MG tablet Take 1,000 mg by mouth 2 (two) times daily.    . nitroGLYCERIN (NITROSTAT) 0.4 MG SL tablet PLACE 1 TABLET (0.4 MG TOTAL) UNDER THE TONGUE EVERY 5  MINUTES AS NEEDED FOR CHEST PAIN. (Patient taking differently: Place 0.4 mg under the tongue every 5 (five) minutes as needed for chest pain. ) 25 tablet 3  . pantoprazole (PROTONIX) 40 MG tablet Take 1 tablet (40 mg total) by mouth 2 (two) times daily. To protect stomach while taking multiple blood thinners. 60 tablet 2  . potassium chloride SA (K-DUR) 20 MEQ tablet Take 1 tablet (20 mEq total) by mouth daily. 30 tablet 1  . psyllium (METAMUCIL) 58.6 % packet Take 0.5 packets by mouth 2 (two) times daily.     . traMADol (ULTRAM) 50 MG tablet Take 1 tablet (50 mg total) by mouth every 8 (eight) hours as needed. 20 tablet 0  . traZODone (DESYREL) 100 MG tablet Take 100 mg by mouth at bedtime.      No current facility-administered medications for this visit.    REVIEW OF SYSTEMS:   [X]  denotes positive finding, [ ]  denotes negative finding Cardiac  Comments:  Chest pain or chest pressure:    Shortness of breath upon exertion:    Short of breath when lying flat:  Irregular heart rhythm:    Constitutional    Fever or chills:      PHYSICAL EXAM:   Vitals:   10/09/19 1353  BP: 114/61  Pulse: 76  Resp: 20  Temp: (!) 97.3 F (36.3 C)  SpO2: 95%  Weight: 191 lb (86.6 kg)  Height: 5\' 7"  (1.702 m)    GENERAL: The patient is a well-nourished male, in no acute distress. The vital signs are documented above. CARDIOVASCULAR: There is a regular rate and rhythm. PULMONARY: Non-labored respirations   STUDIES:   none   MEDICAL ISSUES:   The patient's amputation site appears to be healing nicely.  There is some redness on the dorsum of the foot near the incision that I am unsure as to whether or not this is a reaction to the sutures, resolving hematoma, or mild cellulitis.  He had a reaction to the  doxycycline, and so I am going to give him Keflex.  I have him scheduled to follow-up in the [facility] clinic in 2 weeks for suture removal.  He is on the schedule for vascular lab studies in October with follow-up.  Leia Alf, MD, FACS [facility] [phone] Pager [phone]

## 2019-10-22 ENCOUNTER — Ambulatory Visit (INDEPENDENT_AMBULATORY_CARE_PROVIDER_SITE_OTHER): Payer: Self-pay | Admitting: Physician Assistant

## 2019-10-22 ENCOUNTER — Other Ambulatory Visit: Payer: Self-pay

## 2019-10-22 VITALS — BP 96/50 | HR 63 | Temp 97.5°F | Resp 20 | Ht 67.0 in | Wt 188.0 lb

## 2019-10-22 DIAGNOSIS — Z89411 Acquired absence of right great toe: Secondary | ICD-10-CM

## 2019-10-22 DIAGNOSIS — I739 Peripheral vascular disease, unspecified: Secondary | ICD-10-CM

## 2019-10-22 NOTE — Progress Notes (Addendum)
POST OPERATIVE OFFICE NOTE    CC:  F/u for surgery  HPI:  This is a 86 y.o. male who has undergone bilateral tibial angioplasty in 2019 with what initially was bilateral great toe ulcers requiring amputation.  These have healed.  He required amputation of his right second toe for a new ulcer, followed by infection in his right third toe which required amputation.  He recently presented with an ischemic fourth right toe and on 09/26/2019 he underwent amputation of the fourth and fifth toe he is back for follow-up.  He was last seen by Dr. Trula Slade on October 09, 2019 who noted erythema of the dorsum of the right foot and was prescribed Keflex.  He finished taking his antibiotic as prescribed.  He indicates he still has some mild postoperative pain.  Denies lower extremity cramping or rest pain.  He ambulates with cane and wears his Darco shoe on the right.  No left foot tissue loss or pain.  He is on Eliquis for atrial fibrillation.  Compliant with statin. He is status post angioplasty of the left PT and peroneal arteries June 2019  Allergies  Allergen Reactions  . Bactrim [Sulfamethoxazole-Trimethoprim]     Pancytopenia  . Codeine Shortness Of Breath  . Doxycycline Swelling  . Heparin Other (See Comments)    +HIT,  Severe bleeding (with heparin drip & large doses), tolerates low doses  . Losartan Swelling    Per ENT UNSPECIFIED REACTION   . Oxycodone Other (See Comments)    "Made me act out of my mind" Mental status changes hallucinations  . Feraheme [Ferumoxytol]     Diaphoretic, chest pain    Current Outpatient Medications  Medication Sig Dispense Refill  . acetaminophen (TYLENOL) 500 MG tablet Take 1 tablet (500 mg total) by mouth every 6 (six) hours as needed for mild pain or headache. 30 tablet 0  . atorvastatin (LIPITOR) 80 MG tablet Take 80 mg by mouth every evening.     . cephALEXin (KEFLEX) 500 MG capsule Take 1 capsule (500 mg total) by mouth 3 (three) times daily. 21 capsule 0   . Cholecalciferol (VITAMIN D) 50 MCG (2000 UT) tablet Take 2,000 Units by mouth daily.    . Cyanocobalamin (B-12) 2500 MCG TABS Take 2,500 mcg by mouth daily.    . DROPLET INSULIN SYRINGE 31G X 5/16" 1 ML MISC     . ELIQUIS 5 MG TABS tablet Take 5 mg by mouth 2 (two) times daily.    . folic acid (FOLVITE) 1 MG tablet Take 1 tablet (1 mg total) by mouth daily. 30 tablet 1  . furosemide (LASIX) 40 MG tablet TAKE 1 TABLET DAILY - MAY TAKE AN EXTRA 20 MG AS NEEDED (DOSE CHANGE 04/01/19) 45 tablet 6  . insulin glargine (LANTUS) 100 UNIT/ML injection Inject 30-40 Units into the skin See admin instructions. Inject 30 units SQ in the morning and inject 40 units SQ at bedtime    . isosorbide mononitrate (IMDUR) 30 MG 24 hr tablet Take 1 tablet (30 mg total) by mouth daily. 90 tablet 1  . lisinopril (ZESTRIL) 5 MG tablet Take 0.5 tablets (2.5 mg total) by mouth daily. (Patient taking differently: Take 5 mg by mouth daily. )    . meclizine (ANTIVERT) 25 MG tablet Take 25 mg by mouth daily as needed for dizziness.    . metFORMIN (GLUCOPHAGE) 1000 MG tablet Take 1,000 mg by mouth 2 (two) times daily.    . nitroGLYCERIN (NITROSTAT) 0.4 MG SL  tablet PLACE 1 TABLET (0.4 MG TOTAL) UNDER THE TONGUE EVERY 5  MINUTES AS NEEDED FOR CHEST PAIN. (Patient taking differently: Place 0.4 mg under the tongue every 5 (five) minutes as needed for chest pain. ) 25 tablet 3  . pantoprazole (PROTONIX) 40 MG tablet Take 1 tablet (40 mg total) by mouth 2 (two) times daily. To protect stomach while taking multiple blood thinners. 60 tablet 2  . potassium chloride SA (K-DUR) 20 MEQ tablet Take 1 tablet (20 mEq total) by mouth daily. 30 tablet 1  . psyllium (METAMUCIL) 58.6 % packet Take 0.5 packets by mouth 2 (two) times daily.     . traMADol (ULTRAM) 50 MG tablet Take 1 tablet (50 mg total) by mouth every 8 (eight) hours as needed. 20 tablet 0  . traZODone (DESYREL) 100 MG tablet Take 100 mg by mouth at bedtime.     . TRUE METRIX  BLOOD GLUCOSE TEST test strip      No current facility-administered medications for this visit.     ROS:  See HPI  Today's Vitals   10/22/19 1405  BP: (!) 96/50  Pulse: 63  Resp: 20  Temp: (!) 97.5 F (36.4 C)  TempSrc: Temporal  SpO2: 100%  Weight: 188 lb (85.3 kg)  Height: 5\' 7"  (1.702 m)   Body mass index is 29.44 kg/m. Physical Exam:  General appearance: Well-developed, well-nourished in no apparent distress.   Cardiac: Regular rate, irregular rhythm Respiratory: Nonlabored Incision: Right toe amputation site incision well approximated Extremities: Right foot is warm and pink.  Trace left posterior tibial pulse.  No significant erythema Neuro: Alert and oriented x4     Assessment/Plan:  This is a 86 y.o. male who is s/p: amputation left fourth and fifth toes.  His amputation site continues to heal.  Remaining sutures removed.  Instructed to call our office for concerns of cool foot, numbness or inability to move his foot.  He is on the schedule for vascular lab studies in October with follow-up.   Risa Grill, PA-C [facility] [phone]  Clinic MD: Carlis Abbott

## 2019-10-24 ENCOUNTER — Other Ambulatory Visit: Payer: Self-pay | Admitting: *Deleted

## 2019-10-25 DIAGNOSIS — D649 Anemia, unspecified: Secondary | ICD-10-CM | POA: Diagnosis not present

## 2019-10-25 DIAGNOSIS — I10 Essential (primary) hypertension: Secondary | ICD-10-CM | POA: Diagnosis not present

## 2019-10-28 ENCOUNTER — Telehealth: Payer: Self-pay | Admitting: Cardiology

## 2019-10-28 NOTE — Telephone Encounter (Signed)
Patient has taken an extra 20 mg lasix for the last 4 days.He takes 40 mg daily.  I will forward to Dr.Branch

## 2019-10-28 NOTE — Telephone Encounter (Signed)
New message    Pt c/o swelling: STAT is pt has developed SOB within 24 hours  1) How much weight have you gained and in what time span? 6lb since thursday  2) If swelling, where is the swelling located? Stomach and legs   3) Are you currently taking a fluid pill? No he is out of fluid pills   4) Are you currently SOB? Yes cant walk from the kitchen to the living room without being completely short of breath  5) Do you have a log of your daily weights (if so, list)? 7/29 188 7/30 191 7/31 194 8/1 195 8/2 196   6) Have you gained 3 pounds in a day or 5 pounds in a week? yes  7) Have you traveled recently? No

## 2019-10-29 MED ORDER — FUROSEMIDE 40 MG PO TABS: ORAL_TABLET | ORAL | 6 refills | Status: DC

## 2019-10-29 NOTE — Telephone Encounter (Signed)
Increase lasix to 80mg  daily x 3 days and udpate [place] on Friday on weight and symptoms   Bettey Costa MD

## 2019-10-29 NOTE — Telephone Encounter (Signed)
Patient states wt today is 199 lbs.  Yesterday he said he was taking lasix 60 mg and now he claims he has been out of lasix since Saturday (4 days)    I refilled his lasix and will ask Dr.branch to review what does of lasix he wants patient to take.

## 2019-10-29 NOTE — Telephone Encounter (Signed)
Clarify he has been taking lasix 60mg  daily, and if not improving can take 80mg  x 3 days and update [place] on Friday   J Daisee Centner MD

## 2019-10-29 NOTE — Telephone Encounter (Signed)
Patient will take lasix 80 mg for 3 days and call back on Friday. His wife is picking up his lasix now

## 2019-11-01 ENCOUNTER — Telehealth: Payer: Self-pay | Admitting: Cardiology

## 2019-11-01 NOTE — Telephone Encounter (Signed)
Pt will increase lasix to 80 mg daily and call us back on 11/04/19

## 2019-11-01 NOTE — Telephone Encounter (Signed)
New message   Just notifying change in weight - please call   Pt c/o swelling: STAT is pt has developed SOB within 24 hours  1) How much weight have you gained and in what time span? Was 199 on Wednesday today 195   2) If swelling, where is the swelling located? Ankles are still swollen , but he can alteast get up and walk today   3) Are you currently taking a fluid pill? Yes has been taking 2 a day   4) Are you currently SOB? Yes- but not as bad as he was on Wednesday   5) Do you have a log of your daily weights (if so, list)? yes  6) Have you gained 3 pounds in a day or 5 pounds in a week? No   7) Have you traveled recently? No

## 2019-11-01 NOTE — Telephone Encounter (Signed)
I will FYI Dr.Branch ?

## 2019-11-01 NOTE — Telephone Encounter (Signed)
Continue the increased lasix dose of 80mg  daily over the weekend and udpate [place] Monday   Zandra Abts MD

## 2019-11-04 ENCOUNTER — Telehealth: Payer: Self-pay | Admitting: Cardiology

## 2019-11-04 DIAGNOSIS — Z79899 Other long term (current) drug therapy: Secondary | ICD-10-CM

## 2019-11-04 MED ORDER — FUROSEMIDE 40 MG PO TABS: ORAL_TABLET | ORAL | 6 refills | Status: DC

## 2019-11-04 NOTE — Telephone Encounter (Signed)
Lower lasix to 60mg  in AM and 40mg  in PM, needs BMET/Mg    Zandra Abts MD

## 2019-11-04 NOTE — Telephone Encounter (Signed)
I spoke with patient.He will take lasix 60 mg am and 40 mg pm and come by for lab slip

## 2019-11-04 NOTE — Telephone Encounter (Signed)
Pt calling to give weights as instructed   Please call [phone]   Thanks rnee

## 2019-11-04 NOTE — Telephone Encounter (Signed)
Wt today 190 lbs (was 195 lbs 3 days ago )   Has taken Lasix 80 mg for past 3 days   States he feels good, please advise.

## 2019-11-07 ENCOUNTER — Other Ambulatory Visit: Payer: Self-pay

## 2019-11-07 DIAGNOSIS — J9611 Chronic respiratory failure with hypoxia: Secondary | ICD-10-CM | POA: Diagnosis not present

## 2019-11-07 DIAGNOSIS — D692 Other nonthrombocytopenic purpura: Secondary | ICD-10-CM | POA: Diagnosis not present

## 2019-11-07 DIAGNOSIS — N183 Chronic kidney disease, stage 3 unspecified: Secondary | ICD-10-CM | POA: Diagnosis not present

## 2019-11-07 DIAGNOSIS — Z794 Long term (current) use of insulin: Secondary | ICD-10-CM | POA: Diagnosis not present

## 2019-11-07 DIAGNOSIS — Z89421 Acquired absence of other right toe(s): Secondary | ICD-10-CM | POA: Diagnosis not present

## 2019-11-07 DIAGNOSIS — E1151 Type 2 diabetes mellitus with diabetic peripheral angiopathy without gangrene: Secondary | ICD-10-CM | POA: Diagnosis not present

## 2019-11-07 DIAGNOSIS — Z7901 Long term (current) use of anticoagulants: Secondary | ICD-10-CM | POA: Diagnosis not present

## 2019-11-07 DIAGNOSIS — D6869 Other thrombophilia: Secondary | ICD-10-CM | POA: Diagnosis not present

## 2019-11-07 DIAGNOSIS — T466X5D Adverse effect of antihyperlipidemic and antiarteriosclerotic drugs, subsequent encounter: Secondary | ICD-10-CM | POA: Diagnosis not present

## 2019-11-07 DIAGNOSIS — E1122 Type 2 diabetes mellitus with diabetic chronic kidney disease: Secondary | ICD-10-CM | POA: Diagnosis not present

## 2019-11-07 DIAGNOSIS — E261 Secondary hyperaldosteronism: Secondary | ICD-10-CM | POA: Diagnosis not present

## 2019-11-07 DIAGNOSIS — E785 Hyperlipidemia, unspecified: Secondary | ICD-10-CM | POA: Diagnosis not present

## 2019-11-07 DIAGNOSIS — I130 Hypertensive heart and chronic kidney disease with heart failure and stage 1 through stage 4 chronic kidney disease, or unspecified chronic kidney disease: Secondary | ICD-10-CM | POA: Diagnosis not present

## 2019-11-07 DIAGNOSIS — Z6829 Body mass index (BMI) 29.0-29.9, adult: Secondary | ICD-10-CM | POA: Diagnosis not present

## 2019-11-07 DIAGNOSIS — Z872 Personal history of diseases of the skin and subcutaneous tissue: Secondary | ICD-10-CM | POA: Diagnosis not present

## 2019-11-07 DIAGNOSIS — G620 Drug-induced polyneuropathy: Secondary | ICD-10-CM | POA: Diagnosis not present

## 2019-11-07 DIAGNOSIS — Z89411 Acquired absence of right great toe: Secondary | ICD-10-CM | POA: Diagnosis not present

## 2019-11-07 DIAGNOSIS — Z89412 Acquired absence of left great toe: Secondary | ICD-10-CM | POA: Diagnosis not present

## 2019-11-07 NOTE — Patient Outreach (Addendum)
[place] [facility] [place]) Care Management  Mike Wright  11/07/2019   Mike Wright June 01, 1933 [id number]  Subjective: Telephone call to patient for disease management follow up. Patient states he is doing pretty good but having some problems with shortness of breath & weight gain He has been in contact with cardiology office.  Patient states that he is taking a total of 80 mg of lasix daily.  In reviewing notes from cardiology the last order was lasix 60 mg in the am and lasix 40 mg in the pm. Patient states he is only taking 32m in the am and 428magain in the pm.  Advised patient that CM would in basket Dr. BrNelly Laurenceurse to clarify dosage. He verbalized understanding. Discussed his heart failure and continued maintenance. He verbalized understanding,  He states that he no longer has toes on the right foot and that amputation site has healed well with no problems.  Patient blood sugars are also good in the 100-150 range per patient.  Advised patient that CM would call him with an update once CM hears back from the nurse.  He verbalized understanding.    Update 11:20 am message from CaBarbarann EhlersRN with Dr, BrHarl BowiePatient should be taking 60 mg of lasix in the am and 40 mg in the pm. Telephone call back to patient and wife. Patient is taking correct dose.  His pills are 40 mg each.  He and wife clarify he is taking 1.5 tablets in the am and 1 tablet in the pm.    Objective:   Encounter Medications:  Outpatient Encounter Medications as of 11/07/2019  Medication Sig  . acetaminophen (TYLENOL) 500 MG tablet Take 1 tablet (500 mg total) by mouth every 6 (six) hours as needed for mild pain or headache.  . Marland Kitchentorvastatin (LIPITOR) 80 MG tablet Take 80 mg by mouth every evening.   . cephALEXin (KEFLEX) 500 MG capsule Take 1 capsule (500 mg total) by mouth 3 (three) times daily.  . Cholecalciferol (VITAMIN D) 50 MCG (2000 UT) tablet Take 2,000 Units by mouth daily.  . Cyanocobalamin  (B-12) 2500 MCG TABS Take 2,500 mcg by mouth daily.  . DROPLET INSULIN SYRINGE 31G X 5/16" 1 ML MISC   . ELIQUIS 5 MG TABS tablet Take 5 mg by mouth 2 (two) times daily.  . folic acid (FOLVITE) 1 MG tablet Take 1 tablet (1 mg total) by mouth daily.  . furosemide (LASIX) 40 MG tablet Take 60 mg ( 1 1/2 tablets) in the am and 40 mg (1 tablet) in the pm  . insulin glargine (LANTUS) 100 UNIT/ML injection Inject 30-40 Units into the skin See admin instructions. Inject 30 units SQ in the morning and inject 40 units SQ at bedtime  . isosorbide mononitrate (IMDUR) 30 MG 24 hr tablet Take 1 tablet (30 mg total) by mouth daily.  . Marland Kitchenisinopril (ZESTRIL) 5 MG tablet Take 0.5 tablets (2.5 mg total) by mouth daily. (Patient taking differently: Take 5 mg by mouth daily. )  . meclizine (ANTIVERT) 25 MG tablet Take 25 mg by mouth daily as needed for dizziness.  . metFORMIN (GLUCOPHAGE) 1000 MG tablet Take 1,000 mg by mouth 2 (two) times daily.  . nitroGLYCERIN (NITROSTAT) 0.4 MG SL tablet PLACE 1 TABLET (0.4 MG TOTAL) UNDER THE TONGUE EVERY 5  MINUTES AS NEEDED FOR CHEST PAIN. (Patient taking differently: Place 0.4 mg under the tongue every 5 (five) minutes as needed for chest pain. )  . pantoprazole (PROTONIX)  40 MG tablet Take 1 tablet (40 mg total) by mouth 2 (two) times daily. To protect stomach while taking multiple blood thinners.  . potassium chloride SA (K-DUR) 20 MEQ tablet Take 1 tablet (20 mEq total) by mouth daily.  . psyllium (METAMUCIL) 58.6 % packet Take 0.5 packets by mouth 2 (two) times daily.   . traMADol (ULTRAM) 50 MG tablet Take 1 tablet (50 mg total) by mouth every 8 (eight) hours as needed.  . traZODone (DESYREL) 100 MG tablet Take 100 mg by mouth at bedtime.   [name] METRIX BLOOD GLUCOSE TEST test strip    No facility-administered encounter medications on file as of 11/07/2019.    Functional Status:  In your present state of health, do you have any difficulty performing the following  activities: 09/26/2019 09/26/2019  Hearing? [place]? Y -  Comment left eye blind -  Difficulty concentrating or making decisions? N -  Walking or climbing stairs? N -  Comment SOB -  Dressing or bathing? N -  Doing errands, shopping? - N  Conservation officer, nature and eating ? - -  Using the Toilet? - -  In the past six months, have you accidently leaked urine? - -  Comment - -  Do you have problems with loss of bowel control? - -  Managing your Medications? - -  Managing your Finances? - -  Housekeeping or managing your Housekeeping? - -  Comment - -  Some recent data might be hidden    Fall/Depression Screening: Fall Risk  08/14/2019 07/29/2019 06/12/2019  Falls in the past year? 1 0 0  Comment week ago in the yard no injuries - -  Number falls in past yr: 0 - -  Injury with Fall? 0 - -  Risk for fall due to : - - -  Follow up Falls prevention discussed - -   PHQ 2/9 Scores 11/07/2019 07/29/2019 06/12/2019 12/20/2018  PHQ - 2 Score 0 0 0 0    Assessment: Patient in contact with heart doctor due to weight gain and shortness of breath.  Patient continues to manage chronic conditions.    Plan:  [facility] CM Care Plan Problem One     Most Recent Value  Care Plan Problem One Chronic Heart Failure  Role Documenting the Problem One Care Management Telephonic Coordinator  Care Plan for Problem One Active  [facility] Long Term Goal  Over the next 90 days, patient will demonstrate and/or verbalize understanding of self-health management for long term care of CHF .  [facility] Long Term Goal Start Date 11/07/19  Interventions for Problem One Long Term Goal Patient weight has increased.  Lasix hsa been increased for a few days.  Patient to go for blood work on tomorrow.  Discussed heart failure and signs of worsening.      [facility] CM Care Plan Problem Two     Most Recent Value  Care Plan Problem Two Recent hospitalization for anaphylaxis  Role Documenting the Problem Two Care Management Telephonic  Coordinator  Care Plan for Problem Two Active  [facility] Long Term Goal Patient will not readmit within the next 31 days  [facility] Long Term Goal Start Date 07/29/19  [facility] CM Short Term Goal #1  Patient will follow up with GI physician to discuss further treatment within 21 days.    [facility] CM Short Term Goal #1 Start Date 07/29/19  [facility] CM Short Term Goal #2  Patient will be able to  recognize signs of anaphylaxis within 21 days.   [facility] CM Short Term Goal #2 Start Date 07/29/19  [facility] CM Short Term Goal #2 Met Date 08/14/19     RN CM will outreach in the month of November and patient agreeable.    [name], RN, MSN [place] [facility] Care Management Coordinator Direct Line [phone] Cell [phone] Toll Free: [phone]  Fax: [phone]

## 2019-11-08 ENCOUNTER — Other Ambulatory Visit (HOSPITAL_COMMUNITY)
Admission: RE | Admit: 2019-11-08 | Discharge: 2019-11-08 | Disposition: A | Discharge status: Home / Self Care | Payer: Medicare HMO | Source: Ambulatory Visit | Attending: Cardiology | Admitting: Cardiology

## 2019-11-08 DIAGNOSIS — Z79899 Other long term (current) drug therapy: Secondary | ICD-10-CM | POA: Insufficient documentation

## 2019-11-08 LAB — BASIC METABOLIC PANEL
Anion gap: 10 (ref 5–15)
BUN: 20 mg/dL (ref 8–23)
CO2: 25 mmol/L (ref 22–32)
Calcium: 8.5 mg/dL — ABNORMAL LOW (ref 8.9–10.3)
Chloride: 101 mmol/L (ref 98–111)
Creatinine, Ser: 1.28 mg/dL — ABNORMAL HIGH (ref 0.61–1.24)
GFR calc Af Amer: 60 mL/min — ABNORMAL LOW (ref >60)
GFR calc non Af Amer: 51 mL/min — ABNORMAL LOW (ref >60)
Glucose, Bld: 133 mg/dL — ABNORMAL HIGH (ref 70–99)
Potassium: 3.7 mmol/L (ref 3.5–5.1)
Sodium: 136 mmol/L (ref 135–145)

## 2019-11-08 LAB — MAGNESIUM: Magnesium: 1.7 mg/dL (ref 1.7–2.4)

## 2019-11-14 ENCOUNTER — Telehealth: Payer: Self-pay | Admitting: Cardiology

## 2019-11-14 ENCOUNTER — Telehealth: Payer: Self-pay | Admitting: *Deleted

## 2019-11-14 DIAGNOSIS — I10 Essential (primary) hypertension: Secondary | ICD-10-CM

## 2019-11-14 NOTE — Telephone Encounter (Signed)
Pt aware - taking 2 tablets in the morning and 1 tablet at night of lasix and weight is down to 192lbs and denies any swelling

## 2019-11-14 NOTE — Telephone Encounter (Signed)
-----   Message from Arnoldo Lenis, MD sent at 11/13/2019  9:06 AM EDT ----- Labs look good. How are the weights and swelling doing   Zandra Abts MD

## 2019-11-14 NOTE — Telephone Encounter (Signed)
New message     Patient needs prescription sent to [facility] in [place] for him to get portable oxygen   Patient did not have any information on where you are to fax it    He is going to run errands he should be home by 5pm or he will call you back tomorrow

## 2019-11-15 NOTE — Telephone Encounter (Signed)
Can continue lasix 80mg  in AM and 40mg  in PM for now, needs repeat BMET in 2 weeks  J Zionah Criswell MD

## 2019-11-15 NOTE — Telephone Encounter (Signed)
Any O2 needs would need to be managed by pcp  Zandra Abts MD

## 2019-11-15 NOTE — Telephone Encounter (Signed)
Pt notified and states that he has an appt with his PCP.

## 2019-11-18 NOTE — Telephone Encounter (Signed)
Pt voiced understanding - will mail lab orders as requested

## 2019-11-18 NOTE — Addendum Note (Signed)
Addended by: Julian Hy T on: 11/18/2019 08:28 AM   Modules accepted: Orders

## 2019-11-19 DIAGNOSIS — Z299 Encounter for prophylactic measures, unspecified: Secondary | ICD-10-CM | POA: Diagnosis not present

## 2019-11-19 DIAGNOSIS — I5032 Chronic diastolic (congestive) heart failure: Secondary | ICD-10-CM | POA: Diagnosis not present

## 2019-11-19 DIAGNOSIS — E11319 Type 2 diabetes mellitus with unspecified diabetic retinopathy without macular edema: Secondary | ICD-10-CM | POA: Diagnosis not present

## 2019-11-19 DIAGNOSIS — I509 Heart failure, unspecified: Secondary | ICD-10-CM | POA: Diagnosis not present

## 2019-11-19 DIAGNOSIS — I10 Essential (primary) hypertension: Secondary | ICD-10-CM | POA: Diagnosis not present

## 2019-11-19 DIAGNOSIS — J189 Pneumonia, unspecified organism: Secondary | ICD-10-CM | POA: Diagnosis not present

## 2019-11-19 DIAGNOSIS — J811 Chronic pulmonary edema: Secondary | ICD-10-CM | POA: Diagnosis not present

## 2019-11-19 DIAGNOSIS — R0602 Shortness of breath: Secondary | ICD-10-CM | POA: Diagnosis not present

## 2019-11-19 DIAGNOSIS — E1129 Type 2 diabetes mellitus with other diabetic kidney complication: Secondary | ICD-10-CM | POA: Diagnosis not present

## 2019-11-19 DIAGNOSIS — R05 Cough: Secondary | ICD-10-CM | POA: Diagnosis not present

## 2019-11-19 DIAGNOSIS — Z87891 Personal history of nicotine dependence: Secondary | ICD-10-CM | POA: Diagnosis not present

## 2019-11-20 DIAGNOSIS — R710 Precipitous drop in hematocrit: Secondary | ICD-10-CM | POA: Diagnosis not present

## 2019-11-20 DIAGNOSIS — Z299 Encounter for prophylactic measures, unspecified: Secondary | ICD-10-CM | POA: Diagnosis not present

## 2019-11-20 DIAGNOSIS — D649 Anemia, unspecified: Secondary | ICD-10-CM | POA: Diagnosis not present

## 2019-11-20 DIAGNOSIS — Z87891 Personal history of nicotine dependence: Secondary | ICD-10-CM | POA: Diagnosis not present

## 2019-11-20 DIAGNOSIS — I10 Essential (primary) hypertension: Secondary | ICD-10-CM | POA: Diagnosis not present

## 2019-11-21 DIAGNOSIS — D649 Anemia, unspecified: Secondary | ICD-10-CM | POA: Diagnosis not present

## 2019-11-22 DIAGNOSIS — Z299 Encounter for prophylactic measures, unspecified: Secondary | ICD-10-CM | POA: Diagnosis not present

## 2019-11-22 DIAGNOSIS — I509 Heart failure, unspecified: Secondary | ICD-10-CM | POA: Diagnosis not present

## 2019-11-22 DIAGNOSIS — I10 Essential (primary) hypertension: Secondary | ICD-10-CM | POA: Diagnosis not present

## 2019-11-22 DIAGNOSIS — D649 Anemia, unspecified: Secondary | ICD-10-CM | POA: Diagnosis not present

## 2019-11-26 DIAGNOSIS — D649 Anemia, unspecified: Secondary | ICD-10-CM | POA: Diagnosis not present

## 2019-11-26 DIAGNOSIS — I10 Essential (primary) hypertension: Secondary | ICD-10-CM | POA: Diagnosis not present

## 2019-11-29 DIAGNOSIS — I10 Essential (primary) hypertension: Secondary | ICD-10-CM | POA: Diagnosis not present

## 2019-11-29 DIAGNOSIS — J9611 Chronic respiratory failure with hypoxia: Secondary | ICD-10-CM | POA: Diagnosis not present

## 2019-11-29 DIAGNOSIS — D649 Anemia, unspecified: Secondary | ICD-10-CM | POA: Diagnosis not present

## 2019-11-29 DIAGNOSIS — Z299 Encounter for prophylactic measures, unspecified: Secondary | ICD-10-CM | POA: Diagnosis not present

## 2019-11-29 DIAGNOSIS — E1165 Type 2 diabetes mellitus with hyperglycemia: Secondary | ICD-10-CM | POA: Diagnosis not present

## 2019-11-29 DIAGNOSIS — I509 Heart failure, unspecified: Secondary | ICD-10-CM | POA: Diagnosis not present

## 2019-12-04 ENCOUNTER — Other Ambulatory Visit: Payer: Self-pay

## 2019-12-04 ENCOUNTER — Other Ambulatory Visit (HOSPITAL_COMMUNITY)
Admission: RE | Admit: 2019-12-04 | Discharge: 2019-12-04 | Disposition: A | Discharge status: Home / Self Care | Payer: Medicare HMO | Source: Ambulatory Visit | Attending: Cardiology | Admitting: Cardiology

## 2019-12-04 DIAGNOSIS — I10 Essential (primary) hypertension: Secondary | ICD-10-CM | POA: Insufficient documentation

## 2019-12-04 LAB — BASIC METABOLIC PANEL
Anion gap: 10 (ref 5–15)
BUN: 14 mg/dL (ref 8–23)
CO2: 29 mmol/L (ref 22–32)
Calcium: 8.8 mg/dL — ABNORMAL LOW (ref 8.9–10.3)
Chloride: 100 mmol/L (ref 98–111)
Creatinine, Ser: 1.16 mg/dL (ref 0.61–1.24)
GFR calc Af Amer: >60 mL/min (ref >60)
GFR calc non Af Amer: 58 mL/min — ABNORMAL LOW (ref >60)
Glucose, Bld: 100 mg/dL — ABNORMAL HIGH (ref 70–99)
Potassium: 4.4 mmol/L (ref 3.5–5.1)
Sodium: 139 mmol/L (ref 135–145)

## 2019-12-19 DIAGNOSIS — Z89412 Acquired absence of left great toe: Secondary | ICD-10-CM | POA: Diagnosis not present

## 2019-12-19 DIAGNOSIS — E261 Secondary hyperaldosteronism: Secondary | ICD-10-CM | POA: Diagnosis not present

## 2019-12-19 DIAGNOSIS — G4733 Obstructive sleep apnea (adult) (pediatric): Secondary | ICD-10-CM | POA: Diagnosis not present

## 2019-12-19 DIAGNOSIS — J9611 Chronic respiratory failure with hypoxia: Secondary | ICD-10-CM | POA: Diagnosis not present

## 2019-12-19 DIAGNOSIS — M0600 Rheumatoid arthritis without rheumatoid factor, unspecified site: Secondary | ICD-10-CM | POA: Diagnosis not present

## 2019-12-19 DIAGNOSIS — D692 Other nonthrombocytopenic purpura: Secondary | ICD-10-CM | POA: Diagnosis not present

## 2019-12-19 DIAGNOSIS — Z89421 Acquired absence of other right toe(s): Secondary | ICD-10-CM | POA: Diagnosis not present

## 2019-12-19 DIAGNOSIS — Z6828 Body mass index (BMI) 28.0-28.9, adult: Secondary | ICD-10-CM | POA: Diagnosis not present

## 2019-12-19 DIAGNOSIS — N183 Chronic kidney disease, stage 3 unspecified: Secondary | ICD-10-CM | POA: Diagnosis not present

## 2019-12-19 DIAGNOSIS — G620 Drug-induced polyneuropathy: Secondary | ICD-10-CM | POA: Diagnosis not present

## 2019-12-19 DIAGNOSIS — E785 Hyperlipidemia, unspecified: Secondary | ICD-10-CM | POA: Diagnosis not present

## 2019-12-19 DIAGNOSIS — D6869 Other thrombophilia: Secondary | ICD-10-CM | POA: Diagnosis not present

## 2019-12-19 DIAGNOSIS — Z7901 Long term (current) use of anticoagulants: Secondary | ICD-10-CM | POA: Diagnosis not present

## 2019-12-19 DIAGNOSIS — Z89411 Acquired absence of right great toe: Secondary | ICD-10-CM | POA: Diagnosis not present

## 2019-12-19 DIAGNOSIS — T465X5D Adverse effect of other antihypertensive drugs, subsequent encounter: Secondary | ICD-10-CM | POA: Diagnosis not present

## 2019-12-19 DIAGNOSIS — I130 Hypertensive heart and chronic kidney disease with heart failure and stage 1 through stage 4 chronic kidney disease, or unspecified chronic kidney disease: Secondary | ICD-10-CM | POA: Diagnosis not present

## 2019-12-19 DIAGNOSIS — I509 Heart failure, unspecified: Secondary | ICD-10-CM | POA: Diagnosis not present

## 2019-12-19 DIAGNOSIS — I25118 Atherosclerotic heart disease of native coronary artery with other forms of angina pectoris: Secondary | ICD-10-CM | POA: Diagnosis not present

## 2019-12-20 ENCOUNTER — Telehealth: Payer: Self-pay | Admitting: *Deleted

## 2019-12-20 ENCOUNTER — Other Ambulatory Visit: Payer: Self-pay | Admitting: Cardiology

## 2019-12-20 NOTE — Telephone Encounter (Signed)
Left normal results on VM

## 2019-12-20 NOTE — Telephone Encounter (Signed)
-----   Message from Arnoldo Lenis, MD sent at 12/18/2019  9:28 AM EDT ----- Labs look good   J BrancH MD

## 2019-12-20 NOTE — Telephone Encounter (Signed)
Contacted to verify furosemide dose. Reports taking furosemide 80 mg BID. Advised that our instructions from Dr. [name] is for 80 mg in the morning and 40 mg in the evening. Reports Sharyl Nimrod his PCP, increased furosemide to 80 mg BID on 11/19/2019 and says a new prescription was sent to his local pharmacy.  Say he will contact his PCP office to send refill to [facility] since dose changed to 80 mg BID

## 2019-12-23 DIAGNOSIS — I509 Heart failure, unspecified: Secondary | ICD-10-CM | POA: Diagnosis not present

## 2019-12-30 DIAGNOSIS — G4700 Insomnia, unspecified: Secondary | ICD-10-CM | POA: Diagnosis not present

## 2019-12-30 DIAGNOSIS — Z299 Encounter for prophylactic measures, unspecified: Secondary | ICD-10-CM | POA: Diagnosis not present

## 2019-12-30 DIAGNOSIS — I10 Essential (primary) hypertension: Secondary | ICD-10-CM | POA: Diagnosis not present

## 2019-12-30 DIAGNOSIS — D649 Anemia, unspecified: Secondary | ICD-10-CM | POA: Diagnosis not present

## 2019-12-30 DIAGNOSIS — I5032 Chronic diastolic (congestive) heart failure: Secondary | ICD-10-CM | POA: Diagnosis not present

## 2019-12-30 DIAGNOSIS — J9611 Chronic respiratory failure with hypoxia: Secondary | ICD-10-CM | POA: Diagnosis not present

## 2020-01-08 DIAGNOSIS — Z299 Encounter for prophylactic measures, unspecified: Secondary | ICD-10-CM | POA: Diagnosis not present

## 2020-01-08 DIAGNOSIS — D649 Anemia, unspecified: Secondary | ICD-10-CM | POA: Diagnosis not present

## 2020-01-08 DIAGNOSIS — I509 Heart failure, unspecified: Secondary | ICD-10-CM | POA: Diagnosis not present

## 2020-01-08 DIAGNOSIS — E1165 Type 2 diabetes mellitus with hyperglycemia: Secondary | ICD-10-CM | POA: Diagnosis not present

## 2020-01-08 DIAGNOSIS — I10 Essential (primary) hypertension: Secondary | ICD-10-CM | POA: Diagnosis not present

## 2020-01-15 DIAGNOSIS — Z299 Encounter for prophylactic measures, unspecified: Secondary | ICD-10-CM | POA: Diagnosis not present

## 2020-01-15 DIAGNOSIS — I5032 Chronic diastolic (congestive) heart failure: Secondary | ICD-10-CM | POA: Diagnosis not present

## 2020-01-15 DIAGNOSIS — I10 Essential (primary) hypertension: Secondary | ICD-10-CM | POA: Diagnosis not present

## 2020-01-15 DIAGNOSIS — H548 Legal blindness, as defined in USA: Secondary | ICD-10-CM | POA: Diagnosis not present

## 2020-01-15 DIAGNOSIS — D500 Iron deficiency anemia secondary to blood loss (chronic): Secondary | ICD-10-CM | POA: Diagnosis not present

## 2020-01-15 DIAGNOSIS — E1165 Type 2 diabetes mellitus with hyperglycemia: Secondary | ICD-10-CM | POA: Diagnosis not present

## 2020-01-15 DIAGNOSIS — Z23 Encounter for immunization: Secondary | ICD-10-CM | POA: Diagnosis not present

## 2020-01-15 NOTE — Progress Notes (Signed)
Cardiology Office Note    Date:  01/21/2020   ID:  Wright, Mike [birth year], MRN [id number]  PCP:  Monico Blitz, MD  Cardiologist: Carlyle Dolly, MD EPS: None  No chief complaint on file.   History of Present Illness:  Mike Wright is a 86 y.o. male with history ofCAD status post DES to diagonal 1, DES to mid to distal LAD, DES to proximal LAD 04/2017, recurrent admissions for chest pain 10/2017 and 11/2017 ACS work-up negative, some chronic chest pain, chronic diastolic CHF, PAF off AV nodal agents due to bradycardia, hypertension, history of GI bleed Eliquis stopped but back on, PAD with poor healing and gangrene status post toe amputations 03/2019.  Last telemedicine visit with Dr. [name] 08/2019 no changes made  Patient was in [facility] 01/17/2020 for blood transfusion and shortness of breath-congestive heart failure treated with IV Lasix 40 mg x 1.  Hemoglobin was 7.1 and he was transfused a unit of blood.  Patient comes in for f/u. Says he's anemic down to 6.8 and is requiring transfusions every 2 months. Started having chest pain daily relieved with 1 SL NTG. Feels a tightness in left chest into left shoulder-notices it more often when sitting relieved with NTG. Doesn't notice as much when moving around. His chronic anemia seems to have worsened. Sees GI and previously saw Dr. Irene Limbo hematology 2 yrs ago. Denies black stools, bleeding. He says his legs started to swell badly about a month ago and Lasix increased to 80 mg by his PCP he then required IV Lasix after transfusion 01/17/2020. Says he watches his salt closely.  Past Medical History:  Diagnosis Date  . Anemia    a. mild, noted 04/2017.  [name] CAD in native artery    a. [name] 04/2017 s/p DES to D1, DES to prox-mid LAD, DES to prox LAD overlapping the prior stent, LVEF 55-65%.   . Chronic diastolic CHF (congestive heart failure) ([place])   . Constipation   . COPD (chronic obstructive pulmonary disease) ([place])   .  Diabetic ulcer of toe ([place])   . DJD (degenerative joint disease) of cervical spine   . Dysrhythmia    AFib  . Essential hypertension   . GERD (gastroesophageal reflux disease)   . History of hiatal hernia   . HIT (heparin-induced thrombocytopenia) ([place])   . Hypothyroidism   . Hypoxia    a. went home on home O2 04/2017.  [name] Insomnia   . Mixed hyperlipidemia   . PAD (peripheral artery disease) ([place])   . PAF (paroxysmal atrial fibrillation) ([place])   . PVD (peripheral vascular disease) ([place])   . Renal insufficiency   . 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  . Retinitis   . Sinus bradycardia    a. HR 30s-40s in 04/2017 -> diltiazem stopped, metoprolol reduced.  . Sleep apnea    "chose not to order CPAP at this time" (05/18/2017)  . Type 2 diabetes mellitus ([place])   . Wears glasses   . Wheezing    a. suspected COPD 04/2017. Former tobacco x 40 years.    Past Surgical History:  Procedure Laterality Date  . ABDOMINAL AORTOGRAM W/LOWER EXTREMITY N/A 09/15/2017   Procedure: ABDOMINAL AORTOGRAM W/LOWER EXTREMITY;  Surgeon: Serafina Mitchell, MD;  Location: [facility];  Service: Cardiovascular;  Laterality: N/A;  . AMPUTATION Bilateral 09/20/2017   Procedure: BILATERAL GREAT TOE AMPUTATIONS INCLUDING METATARSAL HEADS;  Surgeon: Serafina Mitchell, MD;  Location: [facility]  OR;  Service: Vascular;  Laterality: Bilateral;  . AMPUTATION Right 03/01/2019   Procedure: AMPUTATION TOES;  Surgeon: Angelia Mould, MD;  Location: [place];  Service: Vascular;  Laterality: Right;  . AMPUTATION Right 05/29/2019   Procedure: AMPUTATION RIGHT THIRD TOE;  Surgeon: Serafina Mitchell, MD;  Location: [facility] OR;  Service: Vascular;  Laterality: Right;  . AMPUTATION Right 09/26/2019   Procedure: AMPUTATION RIGHT FOURTH TOE AND FIFTH TOES;  Surgeon: Serafina Mitchell, MD;  [facility];  Service: Vascular;  Laterality: Right;  . ANGIOPLASTY Right 09/20/2017   Procedure: ANGIOPLASTY RIGHT TIBIAL ARTERY;  Surgeon:  Serafina Mitchell, MD;  Location: [place];  Service: Vascular;  Laterality: Right;  . APPENDECTOMY    . BACK SURGERY    . CARDIAC CATHETERIZATION  1980s; 2012;  . CATARACT EXTRACTION Left 2004  . COLONOSCOPY  2004   FLEISHMAN TICS  . COLONOSCOPY WITH PROPOFOL N/A 11/28/2018   Procedure: COLONOSCOPY WITH PROPOFOL;  Surgeon: Rogene Houston, MD;  [facility];  Service: Endoscopy;  Laterality: N/A;  . CORONARY ANGIOPLASTY WITH STENT PLACEMENT  05/18/2017   "3 stents"  . CORONARY STENT INTERVENTION N/A 05/18/2017   Procedure: CORONARY STENT INTERVENTION;  Surgeon: Jettie Booze, MD;  Location: [place] [facility];  Service: Cardiovascular;  Laterality: N/A;  . ESOPHAGOGASTRODUODENOSCOPY (EGD) WITH PROPOFOL N/A 11/20/2017   Procedure: ESOPHAGOGASTRODUODENOSCOPY (EGD) WITH PROPOFOL;  Surgeon: Irene Shipper, MD;  Location: [place];  Service: Gastroenterology;  Laterality: N/A;  . ESOPHAGOGASTRODUODENOSCOPY (EGD) WITH PROPOFOL N/A 11/28/2018   Procedure: ESOPHAGOGASTRODUODENOSCOPY (EGD) WITH PROPOFOL;  Surgeon: Rogene Houston, MD;  [facility];  Service: Endoscopy;  Laterality: N/A;  . ESOPHAGOGASTRODUODENOSCOPY (EGD) WITH PROPOFOL N/A 07/12/2019   Procedure: ESOPHAGOGASTRODUODENOSCOPY (EGD) WITH PROPOFOL;  Surgeon: Rogene Houston, MD;  [facility];  Service: Endoscopy;  Laterality: N/A;  125  . GIVENS CAPSULE STUDY N/A 12/17/2018   Procedure: GIVENS CAPSULE STUDY;  Surgeon: Rogene Houston, MD;  [facility];  Service: Endoscopy;  Laterality: N/A;  7:30am  . I & D EXTREMITY Right 12/17/2017   Procedure: IRRIGATION AND DEBRIDEMENT RIGHT GREAT TOE;  Surgeon: Serafina Mitchell, MD;  [facility];  Service: Vascular;  Laterality: Right;  . JOINT REPLACEMENT    . LEFT HEART CATH AND CORONARY ANGIOGRAPHY N/A 05/18/2017   Procedure: LEFT HEART CATH AND CORONARY ANGIOGRAPHY;  Surgeon: Jettie Booze, MD;  Location: [place] [facility];  Service:  Cardiovascular;  Laterality: N/A;  . LOWER EXTREMITY ANGIOGRAM Right 09/20/2017   Procedure: RIGHT LOWER LEG  ANGIOGRAM;  Surgeon: Serafina Mitchell, MD;  Location: [place];  Service: Vascular;  Laterality: Right;  . LUMBAR FUSION  2002   L3, 4 L4, 5 L5 S1 Fused by Dr. Glenna Fellows  . PERIPHERAL VASCULAR BALLOON ANGIOPLASTY Left 09/15/2017   Procedure: PERIPHERAL VASCULAR BALLOON ANGIOPLASTY;  Surgeon: Serafina Mitchell, MD;  Location: [place] [facility];  Service: Cardiovascular;  Laterality: Left;  PTA of Peroneal & Posterior Tibial  . POLYPECTOMY  11/28/2018   Procedure: POLYPECTOMY;  Surgeon: Rogene Houston, MD;  [facility];  Service: Endoscopy;;  duodenum  . POSTERIOR LUMBAR FUSION    . RHINOPLASTY    . RIGHT HEART CATH N/A 05/18/2017   Procedure: RIGHT HEART CATH;  Surgeon: Jettie Booze, MD;  Location: [place] [facility];  Service: Cardiovascular;  Laterality: N/A;  . TEE WITHOUT CARDIOVERSION N/A 09/18/2017  Procedure: TRANSESOPHAGEAL ECHOCARDIOGRAM (TEE);  Surgeon: Fay Records, MD;  Location: [place];  Service: Cardiovascular;  Laterality: N/A;  . TOTAL KNEE ARTHROPLASTY Bilateral   . TRANSURETHRAL RESECTION OF PROSTATE  2001   [place]    Current Medications: Current Meds  Medication Sig  . acetaminophen (TYLENOL) 500 MG tablet Take 1 tablet (500 mg total) by mouth every 6 (six) hours as needed for mild pain or headache.  [name] atorvastatin (LIPITOR) 80 MG tablet Take 80 mg by mouth every evening.   . Cholecalciferol (VITAMIN D) 50 MCG (2000 UT) tablet Take 2,000 Units by mouth daily.  . Cyanocobalamin (B-12) 2500 MCG TABS Take 2,500 mcg by mouth daily.  . DROPLET INSULIN SYRINGE 31G X 5/16" 1 ML MISC   . ELIQUIS 5 MG TABS tablet Take 5 mg by mouth 2 (two) times daily.  . folic acid (FOLVITE) 1 MG tablet Take 1 tablet (1 mg total) by mouth daily.  . furosemide (LASIX) 80 MG tablet Take 1 tablet by mouth in the morning and at bedtime.  . insulin glargine (LANTUS)  100 UNIT/ML injection Inject 30-40 Units into the skin See admin instructions. Inject 30 units SQ in the morning and inject 40 units SQ at bedtime  . lisinopril (ZESTRIL) 5 MG tablet Take 0.5 tablets (2.5 mg total) by mouth daily.  . meclizine (ANTIVERT) 25 MG tablet Take 25 mg by mouth daily as needed for dizziness.  . metFORMIN (GLUCOPHAGE) 1000 MG tablet Take 1,000 mg by mouth 2 (two) times daily.  . nitroGLYCERIN (NITROSTAT) 0.4 MG SL tablet PLACE 1 TABLET (0.4 MG TOTAL) UNDER THE TONGUE EVERY 5  MINUTES AS NEEDED FOR CHEST PAIN. (Patient taking differently: Place 0.4 mg under the tongue every 5 (five) minutes as needed for chest pain. )  . pantoprazole (PROTONIX) 40 MG tablet Take 1 tablet (40 mg total) by mouth 2 (two) times daily. To protect stomach while taking multiple blood thinners.  . potassium chloride SA (K-DUR) 20 MEQ tablet Take 1 tablet (20 mEq total) by mouth daily.  . psyllium (METAMUCIL) 58.6 % packet Take 0.5 packets by mouth 2 (two) times daily.   . traMADol (ULTRAM) 50 MG tablet Take 1 tablet (50 mg total) by mouth every 8 (eight) hours as needed.  . traZODone (DESYREL) 100 MG tablet Take 100 mg by mouth at bedtime.   . TRUE METRIX BLOOD GLUCOSE TEST test strip   . [DISCONTINUED] isosorbide mononitrate (IMDUR) 30 MG 24 hr tablet Take 1 tablet (30 mg total) by mouth daily.     Allergies:   Bactrim [sulfamethoxazole-trimethoprim], Codeine, Doxycycline, Heparin, Losartan, Oxycodone, and Feraheme [ferumoxytol]   Social History   Socioeconomic History  . Marital status: Married    Spouse name: Not on file  . Number of children: Not on file  . Years of education: Not on file  . Highest education level: Not on file  Occupational History  . Not on file  Tobacco Use  . Smoking status: Former Smoker    Packs/day: 2.00    Years: 30.00    Pack years: 60.00    Types: Cigarettes    Start date: 03/28/1953    Quit date: 03/29/1983    Years since quitting: 36.8  . Smokeless  tobacco: Never Used  Vaping Use  . Vaping Use: Never used  Substance and Sexual Activity  . Alcohol use: Not Currently    Comment: 05/18/2017 'I drink a beer q yr"  . Drug use: No  . Sexual  activity: Not Currently  Other Topics Concern  . Not on file  Social History Narrative   Patient lives at home with his spouse.    Social Determinants of Health   Financial Resource Strain:   . Difficulty of Paying Living Expenses: Not on file  Food Insecurity:   . Worried About Charity fundraiser in the Last Year: Not on file  . Ran Out of Food in the Last Year: Not on file  Transportation Needs: No Transportation Needs  . Lack of Transportation (Medical): No  . Lack of Transportation (Non-Medical): No  Physical Activity:   . Days of Exercise per Week: Not on file  . Minutes of Exercise per Session: Not on file  Stress: No Stress Concern Present  . Feeling of Stress : Not at all  Social Connections:   . Frequency of Communication with Friends and Family: Not on file  . Frequency of Social Gatherings with Friends and Family: Not on file  . Attends Religious Services: Not on file  . Active Member of Clubs or Organizations: Not on file  . Attends Archivist Meetings: Not on file  . Marital Status: Not on file     Family History:  The patient's family history includes COPD in his father and mother; CVA in his sister; Diabetes in his mother and sister; Heart attack (age of onset: 1) in his father; Heart disease in his mother; Hypertension in his sister; Multiple sclerosis in his sister.   ROS:   Please see the history of present illness.    ROS All other systems reviewed and are negative.   PHYSICAL EXAM:   VS:  BP (!) 112/58   Pulse 80   Ht 5\' 7"  (1.702 m)   Wt 189 lb 6.4 oz (85.9 kg)   SpO2 97%   BMI 29.66 kg/m   Physical Exam  GEN: Well nourished, well developed, in no acute distress  Neck: Bilateral carotid bruits no JVD, or masses Cardiac:RRR; 2/6 systolic murmur  at the left sternal border Respiratory:  clear to auscultation bilaterally, normal work of breathing GI: soft, nontender, nondistended, + BS Ext: without cyanosis, clubbing, or edema, decreased distal pulses bilaterally Neuro:  Alert and Oriented x 3 Psych: euthymic mood, full affect  Wt Readings from Last 3 Encounters:  01/21/20 189 lb 6.4 oz (85.9 kg)  10/22/19 188 lb (85.3 kg)  10/09/19 191 lb (86.6 kg)      Studies/Labs Reviewed:   EKG:  EKG is not ordered today.    Recent Labs: 07/27/2019: ALT 15 09/26/2019: Hemoglobin 8.3; Platelets 260 11/08/2019: Magnesium 1.7 12/04/2019: BUN 14; Creatinine, Ser 1.16; Potassium 4.4; Sodium 139   Lipid Panel    Component Value Date/Time   CHOL 68 03/01/2019 0320   TRIG 114 03/01/2019 0320   HDL 16 (L) 03/01/2019 0320   CHOLHDL 4.3 03/01/2019 0320   VLDL 23 03/01/2019 0320   LDLCALC 29 03/01/2019 0320    Additional studies/ records that were reviewed today include:   2D echo 09/15/2017 Study Conclusions   - Left ventricle: The cavity size was normal. Wall thickness was    increased in a pattern of moderate LVH. Systolic function was    normal. The estimated ejection fraction was in the range of 55%    to 60%. Wall motion was normal; there were no regional wall    motion abnormalities.  - Aortic valve: Trileaflet; moderately thickened, moderately    calcified leaflets. Valve mobility was restricted. There  was mild    stenosis. There was mild regurgitation. Peak velocity (S): 277    cm/s. Mean gradient (S): 16 mm Hg. Valve area (VTI): 2.01 cm^2.    Valve area (Vmax): 1.8 cm^2. Valve area (Vmean): 1.73 cm^2.  - Mitral valve: There was mild regurgitation. Valve area by    pressure half-time: 0.93 cm^2.  - Pulmonary arteries: Systolic pressure was mildly increased. PA    peak pressure: 44 mm Hg (S).   -------------------------------------------------------------------  Study data:  Comparison was made to the study of 07/29/2016.  Study   status:  Routine.  Procedure:  The patient reported no pain pre or  post test. Transthoracic echocardiography. Image quality was  adequate.  Study completion:  There were no complications.  Transthoracic echocardiography.  M-mode, complete 2D, spectral  Doppler, and color Doppler.  Birthdate:  Patient birthdate:  1933/12/17.  Age:  Patient is 86 yr old.  Sex:  Gender: male.  BMI: 33.4 kg/m^2.  Blood pressure:     136/83  Patient status:  Inpatient.  Study date:  Study date: 09/15/2017. Study time: 09:44  AM.  Location:  Bedside.   -------------------------------------------------------------------   -------------------------------------------------------------------  Left ventricle:  The cavity size was normal. Wall thickness was  increased in a pattern of moderate LVH. Systolic function was  normal. The estimated ejection fraction was in the range of 55% to  60%. Wall motion was normal; there were no regional wall motion  abnormalities.   -------------------------------------------------------------------  Aortic valve:   Trileaflet; moderately thickened, moderately  calcified leaflets. Valve mobility was restricted.  Doppler:  There was mild stenosis.   There was mild regurgitation.    VTI  ratio of LVOT to aortic valve: 0.58. Valve area (VTI): 2.01 cm^2.  Indexed valve area (VTI): 0.92 cm^2/m^2. Peak velocity ratio of  LVOT to aortic valve: 0.52. Valve area (Vmax): 1.8 cm^2. Indexed  valve area (Vmax): 0.83 cm^2/m^2. Mean velocity ratio of LVOT to  aortic valve: 0.5. Valve area (Vmean): 1.73 cm^2. Indexed valve  area (Vmean): 0.8 cm^2/m^2.    Mean gradient (S): 16 mm Hg. Peak  gradient (S): 31 mm Hg.   -------------------------------------------------------------------  Aorta: Aortic root: The aortic root was normal in size.   -------------------------------------------------------------------  Mitral valve:   Structurally normal valve.   Mobility was not  restricted.   Doppler:  Transvalvular velocity was within the normal  range. There was no evidence for stenosis. There was mild  regurgitation.    Valve area by pressure half-time: 0.93 cm^2.  Indexed valve area by pressure half-time: 0.43 cm^2/m^2.    Peak  gradient (D): 7 mm Hg.   05/2015 echo Study Conclusions   - Left ventricle: The cavity size was normal. Wall thickness was   increased increased in a pattern of mild to moderate LVH.   Systolic function was vigorous. The estimated ejection fraction   was in the range of 65% to 70%. Wall motion was normal; there   were no regional wall motion abnormalities. The study is not   technically sufficient to allow evaluation of LV diastolic   function. - Aortic valve: Moderately calcified annulus. Mildly thickened   leaflets. There was mild stenosis. There was mild regurgitation.   Mean gradient (S): 13 mm Hg. Valve area (VTI): 1.51 cm^2. - Mitral valve: Mildly calcified annulus. Normal thickness leaflets   . - Left atrium: The atrium was moderately dilated. - Technically adequate study.     06/2015 Nuclear stress test  No diagnostic ST  segment changes to indicate ischemia.  Small, mild intensity, perfusion defects noted in the apical anterior and basal inferolateral walls. This is most consistent with soft tissue attenuation given normal wall motion in these regions. No large ischemic zones noted.  This is a low risk study.  Nuclear stress EF: 64%.   05/2016 Event monitor  Rhythm is atrial fibrillation throughout study. Occasioanal PVCs  Min HR 51, Max HR 107, Avg HR 67  Reported symptoms correspond with rate controlled atrial fibrillation.   09/2016 nuclear stress  There was no ST segment deviation noted during stress.  The study is normal. There are no perfusion defects consistent with prior infarct or current ischemia.  This is a low risk study.  The left ventricular ejection fraction is normal (55-65%).     04/2017 cath  Dist  RCA lesion is 40% stenosed.  Prox RCA lesion is 25% stenosed.  1st Diag lesion is 75% stenosed.  A drug-eluting stent was successfully placed using a STENT SYNERGY DES 2.25X12.  Post intervention, there is a 0% residual stenosis.  Prox LAD to Mid LAD lesion is 75% stenosed.  A drug-eluting stent was successfully placed using a STENT SYNERGY DES 3.5X38.  Post intervention, there is a 0% residual stenosis.  Prox LAD lesion is 70% stenosed.  A drug-eluting stent was successfully placed using a STENT SYNERGY DES 4X12, overlapping the prior stent.  Post intervention, there is a 0% residual stenosis.  The left ventricular systolic function is normal.  The left ventricular ejection fraction is 55-65% by visual estimate.  LV end diastolic pressure is normal.  There is no aortic valve stenosis.  Ao sat 98%, PA sat 65%; PA mean 25 mm Hg; unable to wedge the catheter.  Normal right heat pressures.  Tortuous right subclavian making catheter navigation difficult from the right radial approach.   Continue dual antiplatelet therapy along with Eliquis for 30 days.   Restart Eliquis tomorrow.   After 30 days, stop aspirin.  Continue Plavix and Eliquis.     After 6 months, can consider stopping clopidogrel if he has bleeding issues.  Given the nomber of stents, would try to complete one year of clopidogrel if no bleeding issues.      ASSESSMENT:    1. Chest pain, unspecified type   2. Coronary artery disease involving native coronary artery of native heart without angina pectoris   3. Chronic diastolic CHF (congestive heart failure) (H[facility])   4. Paroxysmal atrial fibrillation ([place])   5. PAD (peripheral artery disease) ([place])   6. PSVT (paroxysmal supraventricular tachycardia) (H[facility])      PLAN:  In order of problems listed above:  CAD status post DES to diagonal 1, DES to mid to distal LAD, DES to proximal LAD 04/2017, recurrent admissions for chest pain 10/2017 and 11/2017 ACS  work-up negative, some chronic chest pain but now occurring daily at rest requiring 1 NTG SL. Doesn't notice exertional chest pain. Occurring in the setting of worsening anemia with Hbg down 6.8-7 range.will order lexiscan, increase imdur 30 mg bid  Chronic diastolic CHF last echo 3/32/9518 normal LVEF 55 to 60% mild aortic stenosis, mild aortic regurgitation, mild mitral regurgitation. Patient's Lasix was increased to 80 mg daily about a month ago and he required 40 mg IV during transfusion last week. We will follow-up renal function and update 2D echo.  Paroxysmal atrial fibrillation with slow rates in the past Toprol stopped  History of GI bleeding 11/2018 Eliquis stopped but he is back  on. And denies any blood in his bowels or bleeding elsewhere. Unfortunately he has chronic anemia requiring transfusions every 1 to 2 months. Previously seen by hematology. Also followed by GI. Recommend he follow-up with GI and hematology with worsening anemia.  PAD followed by vascular with follow-up carotid and lower extremity Dopplers in December and follow-up with Dr. Trula Slade scheduled  Palpitations/PSVT no recent symptoms.  Medication Adjustments/Labs and Tests Ordered: Current medicines are reviewed at length with the patient today.  Concerns regarding medicines are outlined above.  Medication changes, Labs and Tests ordered today are listed in the Patient Instructions below. Patient Instructions  Medication Instructions:  Your physician has recommended you make the following change in your medication:   Increase Imdur to 30 mg Two Times Daily   *If you need a refill on your cardiac medications before your next appointment, please call your pharmacy*   Lab Work: NONE   If you have labs (blood work) drawn today and your tests are completely normal, you will receive your results only by: [name] MyChart Message (if you have MyChart) OR . A paper copy in the mail If you have any lab test that is abnormal  or we need to change your treatment, we will call you to review the results.   Testing/Procedures: Your physician has requested that you have a lexiscan myoview. For further information please visit [email or web address]. Please follow instruction sheet, as given.  Your physician has requested that you have an echocardiogram. Echocardiography is a painless test that uses sound waves to create images of your heart. It provides your doctor with information about the size and shape of your heart and how well your heart's chambers and valves are working. This procedure takes approximately one hour. There are no restrictions for this procedure.   Follow-Up: At [facility], you and your health needs are our priority.  As part of our continuing mission to provide you with exceptional heart care, we have created designated Provider Care Teams.  These Care Teams include your primary Cardiologist (physician) and Advanced Practice Providers (APPs -  Physician Assistants and Nurse Practitioners) who all work together to provide you with the care you need, when you need it.  We recommend signing up for the patient portal called "MyChart".  Sign up information is provided on this After Visit Summary.  MyChart is used to connect with patients for Virtual Visits (Telemedicine).  Patients are able to view lab/test results, encounter notes, upcoming appointments, etc.  Non-urgent messages can be sent to your provider as well.   To learn more about what you can do with MyChart, go to [email or web address].    Your next appointment:    After Testing   The format for your next appointment:   In Person  Provider:   You may see Carlyle Dolly, MD or one of the following Advanced Practice Providers on your designated Care Team:    Bernerd Pho, PA-C   Ermalinda Barrios, [place]     Other Instructions Thank you for choosing [facility]!       Sumner Boast, PA-C  01/21/2020 12:15 PM     [facility] [address], [place], [place]  [address] Phone: [phone]; Fax: [phone]

## 2020-01-17 DIAGNOSIS — I509 Heart failure, unspecified: Secondary | ICD-10-CM | POA: Diagnosis not present

## 2020-01-17 DIAGNOSIS — D649 Anemia, unspecified: Secondary | ICD-10-CM | POA: Diagnosis not present

## 2020-01-21 ENCOUNTER — Ambulatory Visit: Payer: Medicare HMO | Admitting: Physician Assistant

## 2020-01-21 ENCOUNTER — Encounter: Payer: Self-pay | Admitting: Physician Assistant

## 2020-01-21 ENCOUNTER — Encounter: Payer: Self-pay | Admitting: *Deleted

## 2020-01-21 ENCOUNTER — Other Ambulatory Visit: Payer: Self-pay

## 2020-01-21 ENCOUNTER — Ambulatory Visit: Payer: Medicare HMO | Admitting: Cardiology

## 2020-01-21 VITALS — BP 112/58 | HR 80 | Ht 67.0 in | Wt 189.4 lb

## 2020-01-21 DIAGNOSIS — I739 Peripheral vascular disease, unspecified: Secondary | ICD-10-CM | POA: Diagnosis not present

## 2020-01-21 DIAGNOSIS — R079 Chest pain, unspecified: Secondary | ICD-10-CM | POA: Diagnosis not present

## 2020-01-21 DIAGNOSIS — I5032 Chronic diastolic (congestive) heart failure: Secondary | ICD-10-CM | POA: Diagnosis not present

## 2020-01-21 DIAGNOSIS — I2510 Atherosclerotic heart disease of native coronary artery without angina pectoris: Secondary | ICD-10-CM

## 2020-01-21 DIAGNOSIS — I480 Paroxysmal atrial fibrillation: Secondary | ICD-10-CM

## 2020-01-21 DIAGNOSIS — I471 Supraventricular tachycardia: Secondary | ICD-10-CM | POA: Diagnosis not present

## 2020-01-21 MED ORDER — ISOSORBIDE MONONITRATE ER 30 MG PO TB24: 30.0000 mg | ORAL_TABLET | Freq: Two times a day (BID) | ORAL | 3 refills | Status: DC

## 2020-01-21 NOTE — Addendum Note (Signed)
Addended by: Levonne Hubert on: 01/21/2020 12:31 PM   Modules accepted: Orders

## 2020-01-21 NOTE — Patient Instructions (Signed)
Medication Instructions:  Your physician has recommended you make the following change in your medication:   Increase Imdur to 30 mg Two Times Daily   *If you need a refill on your cardiac medications before your next appointment, please call your pharmacy*   Lab Work: NONE   If you have labs (blood work) drawn today and your tests are completely normal, you will receive your results only by: [name] MyChart Message (if you have MyChart) OR . A paper copy in the mail If you have any lab test that is abnormal or we need to change your treatment, we will call you to review the results.   Testing/Procedures: Your physician has requested that you have a lexiscan myoview. For further information please visit [email or web address]. Please follow instruction sheet, as given.  Your physician has requested that you have an echocardiogram. Echocardiography is a painless test that uses sound waves to create images of your heart. It provides your doctor with information about the size and shape of your heart and how well your heart's chambers and valves are working. This procedure takes approximately one hour. There are no restrictions for this procedure.   Follow-Up: At [facility], you and your health needs are our priority.  As part of our continuing mission to provide you with exceptional heart care, we have created designated Provider Care Teams.  These Care Teams include your primary Cardiologist (physician) and Advanced Practice Providers (APPs -  Physician Assistants and Nurse Practitioners) who all work together to provide you with the care you need, when you need it.  We recommend signing up for the patient portal called "MyChart".  Sign up information is provided on this After Visit Summary.  MyChart is used to connect with patients for Virtual Visits (Telemedicine).  Patients are able to view lab/test results, encounter notes, upcoming appointments, etc.  Non-urgent messages can be sent to your  provider as well.   To learn more about what you can do with MyChart, go to [email or web address].    Your next appointment:    After Testing   The format for your next appointment:   In Person  Provider:   You may see Carlyle Dolly, MD or one of the following Advanced Practice Providers on your designated Care Team:    Bernerd Pho, PA-C   Ermalinda Barrios, [place]     Other Instructions Thank you for choosing [facility]!

## 2020-01-22 DIAGNOSIS — I5032 Chronic diastolic (congestive) heart failure: Secondary | ICD-10-CM | POA: Diagnosis not present

## 2020-01-22 DIAGNOSIS — I10 Essential (primary) hypertension: Secondary | ICD-10-CM | POA: Diagnosis not present

## 2020-01-22 DIAGNOSIS — D649 Anemia, unspecified: Secondary | ICD-10-CM | POA: Diagnosis not present

## 2020-01-22 DIAGNOSIS — I509 Heart failure, unspecified: Secondary | ICD-10-CM | POA: Diagnosis not present

## 2020-01-22 DIAGNOSIS — Z299 Encounter for prophylactic measures, unspecified: Secondary | ICD-10-CM | POA: Diagnosis not present

## 2020-01-22 DIAGNOSIS — T454X5A Adverse effect of iron and its compounds, initial encounter: Secondary | ICD-10-CM | POA: Diagnosis not present

## 2020-01-23 ENCOUNTER — Encounter (HOSPITAL_COMMUNITY): Payer: Medicare HMO

## 2020-01-23 DIAGNOSIS — E261 Secondary hyperaldosteronism: Secondary | ICD-10-CM | POA: Diagnosis not present

## 2020-01-23 DIAGNOSIS — N183 Chronic kidney disease, stage 3 unspecified: Secondary | ICD-10-CM | POA: Diagnosis not present

## 2020-01-23 DIAGNOSIS — Z89421 Acquired absence of other right toe(s): Secondary | ICD-10-CM | POA: Diagnosis not present

## 2020-01-23 DIAGNOSIS — Z6828 Body mass index (BMI) 28.0-28.9, adult: Secondary | ICD-10-CM | POA: Diagnosis not present

## 2020-01-23 DIAGNOSIS — I509 Heart failure, unspecified: Secondary | ICD-10-CM | POA: Diagnosis not present

## 2020-01-23 DIAGNOSIS — Z89412 Acquired absence of left great toe: Secondary | ICD-10-CM | POA: Diagnosis not present

## 2020-01-23 DIAGNOSIS — D649 Anemia, unspecified: Secondary | ICD-10-CM | POA: Diagnosis not present

## 2020-01-23 DIAGNOSIS — Z89411 Acquired absence of right great toe: Secondary | ICD-10-CM | POA: Diagnosis not present

## 2020-01-24 ENCOUNTER — Telehealth: Payer: Self-pay | Admitting: Hematology and Oncology

## 2020-01-24 ENCOUNTER — Other Ambulatory Visit (HOSPITAL_COMMUNITY): Payer: Medicare HMO

## 2020-01-24 NOTE — Telephone Encounter (Signed)
A new hem appt has been scheduled for [name] to see Dr. Chryl Heck on 11/4 at [place]. Referring office will notify the pt of the appt date and time.

## 2020-01-29 ENCOUNTER — Ambulatory Visit (HOSPITAL_COMMUNITY)
Admission: RE | Admit: 2020-01-29 | Discharge: 2020-01-29 | Disposition: A | Discharge status: Home / Self Care | Payer: Medicare HMO | Source: Ambulatory Visit | Attending: Cardiology | Admitting: Cardiology

## 2020-01-29 ENCOUNTER — Other Ambulatory Visit: Payer: Self-pay

## 2020-01-29 ENCOUNTER — Encounter (HOSPITAL_COMMUNITY)
Admission: RE | Admit: 2020-01-29 | Discharge: 2020-01-29 | Disposition: A | Discharge status: Home / Self Care | Payer: Medicare HMO | Source: Ambulatory Visit | Attending: Physician Assistant | Admitting: Physician Assistant

## 2020-01-29 ENCOUNTER — Ambulatory Visit (HOSPITAL_COMMUNITY)
Admission: RE | Admit: 2020-01-29 | Discharge: 2020-01-29 | Disposition: A | Discharge status: Home / Self Care | Payer: Medicare HMO | Source: Ambulatory Visit | Attending: Internal Medicine | Admitting: Internal Medicine

## 2020-01-29 DIAGNOSIS — R079 Chest pain, unspecified: Secondary | ICD-10-CM

## 2020-01-29 DIAGNOSIS — I5032 Chronic diastolic (congestive) heart failure: Secondary | ICD-10-CM

## 2020-01-29 LAB — NM MYOCAR MULTI W/SPECT W/WALL MOTION / EF
LV dias vol: 111 mL (ref 62–150)
LV sys vol: 41 mL
Peak HR: 133 {beats}/min
RATE: 0.44
Rest HR: 76 {beats}/min
SDS: 2
SRS: 4
SSS: 6
TID: 0.97

## 2020-01-29 LAB — ECHOCARDIOGRAM COMPLETE
AR max vel: 1.53 cm2
AV Area VTI: 1.4 cm2
AV Area mean vel: 1.47 cm2
AV Mean grad: 27.5 mmHg
AV Peak grad: 51.1 mmHg
Ao pk vel: 3.58 m/s
Area-P 1/2: 5.13 cm2
P 1/2 time: 492 msec
S' Lateral: 2.15 cm

## 2020-01-29 MED ORDER — REGADENOSON 0.4 MG/5ML IV SOLN
INTRAVENOUS | Status: AC
  Administered 2020-01-29: 0.4 mg via INTRAVENOUS
  Filled 2020-01-29: qty 5

## 2020-01-29 MED ORDER — TECHNETIUM TC 99M TETROFOSMIN IV KIT
30.0000 | PACK | Freq: Once | INTRAVENOUS | Status: AC | PRN
  Administered 2020-01-29: 32.7 via INTRAVENOUS

## 2020-01-29 MED ORDER — SODIUM CHLORIDE FLUSH 0.9 % IV SOLN
INTRAVENOUS | Status: AC
  Administered 2020-01-29: 10 mL via INTRAVENOUS
  Filled 2020-01-29: qty 10

## 2020-01-29 MED ORDER — TECHNETIUM TC 99M TETROFOSMIN IV KIT
10.0000 | PACK | Freq: Once | INTRAVENOUS | Status: AC | PRN
  Administered 2020-01-29: 11 via INTRAVENOUS

## 2020-01-29 NOTE — Progress Notes (Deleted)
[facility] CONSULT NOTE  Patient Care Team: Monico Blitz, MD as PCP - General (Internal Medicine) [name] Alphonse Guild, MD as PCP - Cardiology (Cardiology) Monico Blitz, MD as Referring Physician (Internal Medicine) Jon Billings, RN as [place] Management  CHIEF COMPLAINTS/PURPOSE OF CONSULTATION:  Low hemoglobin  HISTORY OF PRESENTING ILLNESS:  Mike Wright 86 y.o. male is here because of anemia.  REVIEW OF SYSTEMS:   Constitutional: Denies fevers, chills or abnormal night sweats Eyes: Denies blurriness of vision, double vision or watery eyes Ears, nose, mouth, throat, and face: Denies mucositis or sore throat Respiratory: Denies cough, dyspnea or wheezes Cardiovascular: Denies palpitation, chest discomfort or lower extremity swelling Gastrointestinal:  Denies nausea, heartburn or change in bowel habits Skin: Denies abnormal skin rashes Lymphatics: Denies new lymphadenopathy or easy bruising Neurological:Denies numbness, tingling or new weaknesses Behavioral/Psych: Mood is stable, no new changes  All other systems were reviewed with the patient and are negative.  MEDICAL HISTORY:  Past Medical History:  Diagnosis Date  . Anemia    a. mild, noted 04/2017.  [name] CAD in native artery    a. [name] 04/2017 s/p DES to D1, DES to prox-mid LAD, DES to prox LAD overlapping the prior stent, LVEF 55-65%.   . Chronic diastolic CHF (congestive heart failure) ([place])   . Constipation   . COPD (chronic obstructive pulmonary disease) ([place])   . Diabetic ulcer of toe ([place])   . DJD (degenerative joint disease) of cervical spine   . Dysrhythmia    AFib  . Essential hypertension   . GERD (gastroesophageal reflux disease)   . History of hiatal hernia   . HIT (heparin-induced thrombocytopenia) ([place])   . Hypothyroidism   . Hypoxia    a. went home on home O2 04/2017.  [name] Insomnia   . Mixed hyperlipidemia   . PAD (peripheral artery disease) ([place])   . PAF  (paroxysmal atrial fibrillation) ([place])   . PVD (peripheral vascular disease) ([place])   . Renal insufficiency   . 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  . Retinitis   . Sinus bradycardia    a. HR 30s-40s in 04/2017 -> diltiazem stopped, metoprolol reduced.  . Sleep apnea    "chose not to order CPAP at this time" (05/18/2017)  . Type 2 diabetes mellitus ([place])   . Wears glasses   . Wheezing    a. suspected COPD 04/2017. Former tobacco x 40 years.    SURGICAL HISTORY: Past Surgical History:  Procedure Laterality Date  . ABDOMINAL AORTOGRAM W/LOWER EXTREMITY N/A 09/15/2017   Procedure: ABDOMINAL AORTOGRAM W/LOWER EXTREMITY;  Surgeon: Serafina Mitchell, MD;  Location: [place] [facility];  Service: Cardiovascular;  Laterality: N/A;  . AMPUTATION Bilateral 09/20/2017   Procedure: BILATERAL GREAT TOE AMPUTATIONS INCLUDING METATARSAL HEADS;  Surgeon: Serafina Mitchell, MD;  [facility];  Service: Vascular;  Laterality: Bilateral;  . AMPUTATION Right 03/01/2019   Procedure: AMPUTATION TOES;  Surgeon: Angelia Mould, MD;  Location: [place];  Service: Vascular;  Laterality: Right;  . AMPUTATION Right 05/29/2019   Procedure: AMPUTATION RIGHT THIRD TOE;  Surgeon: Serafina Mitchell, MD;  Location: [place];  Service: Vascular;  Laterality: Right;  . AMPUTATION Right 09/26/2019   Procedure: AMPUTATION RIGHT FOURTH TOE AND FIFTH TOES;  Surgeon: Serafina Mitchell, MD;  Location: [place];  Service: Vascular;  Laterality: Right;  . ANGIOPLASTY Right 09/20/2017   Procedure: ANGIOPLASTY RIGHT TIBIAL ARTERY;  Surgeon: Trula Slade,  Butch Penny, MD;  Location: [facility];  Service: Vascular;  Laterality: Right;  . APPENDECTOMY    . BACK SURGERY    . CARDIAC CATHETERIZATION  1980s; 2012;  . CATARACT EXTRACTION Left 2004  . COLONOSCOPY  2004   FLEISHMAN TICS  . COLONOSCOPY WITH PROPOFOL N/A 11/28/2018   Procedure: COLONOSCOPY WITH PROPOFOL;  Surgeon: Rogene Houston, MD;  [facility];  Service: Endoscopy;   Laterality: N/A;  . CORONARY ANGIOPLASTY WITH STENT PLACEMENT  05/18/2017   "3 stents"  . CORONARY STENT INTERVENTION N/A 05/18/2017   Procedure: CORONARY STENT INTERVENTION;  Surgeon: Jettie Booze, MD;  Location: [place] [facility];  Service: Cardiovascular;  Laterality: N/A;  . ESOPHAGOGASTRODUODENOSCOPY (EGD) WITH PROPOFOL N/A 11/20/2017   Procedure: ESOPHAGOGASTRODUODENOSCOPY (EGD) WITH PROPOFOL;  Surgeon: Irene Shipper, MD;  Location: [place];  Service: Gastroenterology;  Laterality: N/A;  . ESOPHAGOGASTRODUODENOSCOPY (EGD) WITH PROPOFOL N/A 11/28/2018   Procedure: ESOPHAGOGASTRODUODENOSCOPY (EGD) WITH PROPOFOL;  Surgeon: Rogene Houston, MD;  [facility];  Service: Endoscopy;  Laterality: N/A;  . ESOPHAGOGASTRODUODENOSCOPY (EGD) WITH PROPOFOL N/A 07/12/2019   Procedure: ESOPHAGOGASTRODUODENOSCOPY (EGD) WITH PROPOFOL;  Surgeon: Rogene Houston, MD;  [facility];  Service: Endoscopy;  Laterality: N/A;  125  . GIVENS CAPSULE STUDY N/A 12/17/2018   Procedure: GIVENS CAPSULE STUDY;  Surgeon: Rogene Houston, MD;  [facility];  Service: Endoscopy;  Laterality: N/A;  7:30am  . I & D EXTREMITY Right 12/17/2017   Procedure: IRRIGATION AND DEBRIDEMENT RIGHT GREAT TOE;  Surgeon: Serafina Mitchell, MD;  [facility];  Service: Vascular;  Laterality: Right;  . JOINT REPLACEMENT    . LEFT HEART CATH AND CORONARY ANGIOGRAPHY N/A 05/18/2017   Procedure: LEFT HEART CATH AND CORONARY ANGIOGRAPHY;  Surgeon: Jettie Booze, MD;  Location: [place] [facility];  Service: Cardiovascular;  Laterality: N/A;  . LOWER EXTREMITY ANGIOGRAM Right 09/20/2017   Procedure: RIGHT LOWER LEG  ANGIOGRAM;  Surgeon: Serafina Mitchell, MD;  Location: [place];  Service: Vascular;  Laterality: Right;  . LUMBAR FUSION  2002   L3, 4 L4, 5 L5 S1 Fused by Dr. Glenna Fellows  . PERIPHERAL VASCULAR BALLOON ANGIOPLASTY Left 09/15/2017   Procedure: PERIPHERAL VASCULAR BALLOON ANGIOPLASTY;   Surgeon: Serafina Mitchell, MD;  Location: [place] [facility];  Service: Cardiovascular;  Laterality: Left;  PTA of Peroneal & Posterior Tibial  . POLYPECTOMY  11/28/2018   Procedure: POLYPECTOMY;  Surgeon: Rogene Houston, MD;  [facility];  Service: Endoscopy;;  duodenum  . POSTERIOR LUMBAR FUSION    . RHINOPLASTY    . RIGHT HEART CATH N/A 05/18/2017   Procedure: RIGHT HEART CATH;  Surgeon: Jettie Booze, MD;  Location: [place] [facility];  Service: Cardiovascular;  Laterality: N/A;  . TEE WITHOUT CARDIOVERSION N/A 09/18/2017   Procedure: TRANSESOPHAGEAL ECHOCARDIOGRAM (TEE);  Surgeon: Fay Records, MD;  Location: [place];  Service: Cardiovascular;  Laterality: N/A;  . TOTAL KNEE ARTHROPLASTY Bilateral   . TRANSURETHRAL RESECTION OF PROSTATE  2001   [place]    SOCIAL HISTORY: Social History   Socioeconomic History  . Marital status: Married    Spouse name: Not on file  . Number of children: Not on file  . Years of education: Not on file  . Highest education level: Not on file  Occupational History  . Not on file  Tobacco Use  . Smoking status: Former Smoker  Packs/day: 2.00    Years: 30.00    Pack years: 60.00    Types: Cigarettes    Start date: 03/28/1953    Quit date: 03/29/1983    Years since quitting: 36.8  . Smokeless tobacco: Never Used  Vaping Use  . Vaping Use: Never used  Substance and Sexual Activity  . Alcohol use: Not Currently    Comment: 05/18/2017 'I drink a beer q yr"  . Drug use: No  . Sexual activity: Not Currently  Other Topics Concern  . Not on file  Social History Narrative   Patient lives at home with his spouse.    Social Determinants of Health   Financial Resource Strain:   . Difficulty of Paying Living Expenses: Not on file  Food Insecurity:   . Worried About Charity fundraiser in the Last Year: Not on file  . Ran Out of Food in the Last Year: Not on file  Transportation Needs: No Transportation Needs  . Lack of  Transportation (Medical): No  . Lack of Transportation (Non-Medical): No  Physical Activity:   . Days of Exercise per Week: Not on file  . Minutes of Exercise per Session: Not on file  Stress: No Stress Concern Present  . Feeling of Stress : Not at all  Social Connections:   . Frequency of Communication with Friends and Family: Not on file  . Frequency of Social Gatherings with Friends and Family: Not on file  . Attends Religious Services: Not on file  . Active Member of Clubs or Organizations: Not on file  . Attends Archivist Meetings: Not on file  . Marital Status: Not on file  Intimate Partner Violence:   . Fear of Current or Ex-Partner: Not on file  . Emotionally Abused: Not on file  . Physically Abused: Not on file  . Sexually Abused: Not on file    FAMILY HISTORY: Family History  Problem Relation Age of Onset  . Heart attack Father 11  . COPD Father   . COPD Mother   . Heart disease Mother   . Diabetes Mother   . Hypertension Sister   . CVA Sister   . Diabetes Sister   . Multiple sclerosis Sister     ALLERGIES:  is allergic to bactrim [sulfamethoxazole-trimethoprim], codeine, doxycycline, heparin, losartan, oxycodone, and feraheme [ferumoxytol].  MEDICATIONS:  Current Outpatient Medications  Medication Sig Dispense Refill  . acetaminophen (TYLENOL) 500 MG tablet Take 1 tablet (500 mg total) by mouth every 6 (six) hours as needed for mild pain or headache. 30 tablet 0  . atorvastatin (LIPITOR) 80 MG tablet Take 80 mg by mouth every evening.     . Cholecalciferol (VITAMIN D) 50 MCG (2000 UT) tablet Take 2,000 Units by mouth daily.    . Cyanocobalamin (B-12) 2500 MCG TABS Take 2,500 mcg by mouth daily.    . DROPLET INSULIN SYRINGE 31G X 5/16" 1 ML MISC     . ELIQUIS 5 MG TABS tablet Take 5 mg by mouth 2 (two) times daily.    . folic acid (FOLVITE) 1 MG tablet Take 1 tablet (1 mg total) by mouth daily. 30 tablet 1  . furosemide (LASIX) 80 MG tablet Take 1  tablet by mouth in the morning and at bedtime.    . insulin glargine (LANTUS) 100 UNIT/ML injection Inject 30-40 Units into the skin See admin instructions. Inject 30 units SQ in the morning and inject 40 units SQ at bedtime    .  isosorbide mononitrate (IMDUR) 30 MG 24 hr tablet Take 1 tablet (30 mg total) by mouth in the morning and at bedtime. 180 tablet 3  . lisinopril (ZESTRIL) 5 MG tablet Take 0.5 tablets (2.5 mg total) by mouth daily.    . meclizine (ANTIVERT) 25 MG tablet Take 25 mg by mouth daily as needed for dizziness.    . metFORMIN (GLUCOPHAGE) 1000 MG tablet Take 1,000 mg by mouth 2 (two) times daily.    . nitroGLYCERIN (NITROSTAT) 0.4 MG SL tablet PLACE 1 TABLET (0.4 MG TOTAL) UNDER THE TONGUE EVERY 5  MINUTES AS NEEDED FOR CHEST PAIN. (Patient taking differently: Place 0.4 mg under the tongue every 5 (five) minutes as needed for chest pain. ) 25 tablet 3  . pantoprazole (PROTONIX) 40 MG tablet Take 1 tablet (40 mg total) by mouth 2 (two) times daily. To protect stomach while taking multiple blood thinners. 60 tablet 2  . potassium chloride SA (K-DUR) 20 MEQ tablet Take 1 tablet (20 mEq total) by mouth daily. 30 tablet 1  . psyllium (METAMUCIL) 58.6 % packet Take 0.5 packets by mouth 2 (two) times daily.     . traMADol (ULTRAM) 50 MG tablet Take 1 tablet (50 mg total) by mouth every 8 (eight) hours as needed. 20 tablet 0  . traZODone (DESYREL) 100 MG tablet Take 100 mg by mouth at bedtime.     . TRUE METRIX BLOOD GLUCOSE TEST test strip      No current facility-administered medications for this visit.   Facility-Administered Medications Ordered in Other Visits  Medication Dose Route Frequency Provider Last Rate Last Admin  . technetium tetrofosmin (TC-MYOVIEW) injection 30 millicurie  30 millicurie Intravenous Once PRN Lavonia Dana, MD        REVIEW OF SYSTEMS:   Constitutional: Denies fevers, chills or abnormal night sweats Eyes: Denies blurriness of vision, double vision or  watery eyes Ears, nose, mouth, throat, and face: Denies mucositis or sore throat Respiratory: Denies cough, dyspnea or wheezes Cardiovascular: Denies palpitation, chest discomfort or lower extremity swelling Gastrointestinal:  Denies nausea, heartburn or change in bowel habits Skin: Denies abnormal skin rashes Lymphatics: Denies new lymphadenopathy or easy bruising Neurological:Denies numbness, tingling or new weaknesses Behavioral/Psych: Mood is stable, no new changes  All other systems were reviewed with the patient and are negative.  PHYSICAL EXAMINATION: ECOG PERFORMANCE STATUS: {CHL ONC ECOG PS:[id number]}  There were no vitals filed for this visit. There were no vitals filed for this visit.  GENERAL:alert, no distress and comfortable SKIN: skin color, texture, turgor are normal, no rashes or significant lesions EYES: normal, conjunctiva are pink and non-injected, sclera clear OROPHARYNX:no exudate, no erythema and lips, buccal mucosa, and tongue normal  NECK: supple, thyroid normal size, non-tender, without nodularity LYMPH:  no palpable lymphadenopathy in the cervical, axillary or inguinal LUNGS: clear to auscultation and percussion with normal breathing effort HEART: regular rate & rhythm and no murmurs and no lower extremity edema ABDOMEN:abdomen soft, non-tender and normal bowel sounds Musculoskeletal:no cyanosis of digits and no clubbing  PSYCH: alert & oriented x 3 with fluent speech NEURO: no focal motor/sensory deficits  LABORATORY DATA:  I have reviewed the data as listed Lab Results  Component Value Date   WBC 7.8 09/26/2019   HGB 8.3 (L) 09/26/2019   HCT 27.1 (L) 09/26/2019   MCV 85.0 09/26/2019   PLT 260 09/26/2019     Chemistry      Component Value Date/Time   NA 139 12/04/2019 1416  NA 139 05/30/2017 1531   K 4.4 12/04/2019 1416   CL 100 12/04/2019 1416   CO2 29 12/04/2019 1416   BUN 14 12/04/2019 1416   BUN 13 05/30/2017 1531   CREATININE 1.16  12/04/2019 1416   CREATININE 1.00 01/04/2018 1401      Component Value Date/Time   CALCIUM 8.8 (L) 12/04/2019 1416   ALKPHOS 49 07/27/2019 0502   AST 24 07/27/2019 0502   AST 32 01/04/2018 1401   ALT 15 07/27/2019 0502   ALT 22 01/04/2018 1401   BILITOT 0.8 07/27/2019 0502   BILITOT 1.0 01/04/2018 1401     Reviewed labs from multiple yrs Severe chronic anemia, slightly microcytic and hypochromic Last iron labs and ferritin in Sep 2020 suggestive of iron deficiency Last colonoscopy 2020 with diverticulosis. Last endoscopy 2020 with no major findings.  RADIOGRAPHIC STUDIES: I have personally reviewed the radiological images as listed and agreed with the findings in the report. No results found.  ASSESSMENT & PLAN:  No problem-specific Assessment & Plan notes found for this encounter.  No orders of the defined types were placed in this encounter.   All questions were answered. The patient knows to call the clinic with any problems, questions or concerns. I spent {CHL ONC TIME VISIT - UXLKG:[id number]} counseling the patient face to face. The total time spent in the appointment was {CHL ONC TIME VISIT - UYQIH:[id number]} and more than 50% was on counseling.     Benay Pike, MD 01/29/2020 9:21 AM

## 2020-01-29 NOTE — Progress Notes (Signed)
*  PRELIMINARY RESULTS* Echocardiogram 2D Echocardiogram has been performed.  Mike Wright 01/29/2020, 11:12 AM

## 2020-01-30 ENCOUNTER — Inpatient Hospital Stay: Payer: Medicare HMO | Attending: Hematology and Oncology | Admitting: Hematology and Oncology

## 2020-02-02 ENCOUNTER — Emergency Department (HOSPITAL_COMMUNITY): Payer: Medicare HMO

## 2020-02-02 ENCOUNTER — Inpatient Hospital Stay (HOSPITAL_COMMUNITY)
Admission: EM | Admit: 2020-02-02 | Discharge: 2020-02-07 | DRG: 475 | Disposition: A | Discharge status: Home Health | Payer: Medicare HMO | Attending: Family Medicine | Admitting: Family Medicine

## 2020-02-02 ENCOUNTER — Encounter (HOSPITAL_COMMUNITY): Payer: Self-pay | Admitting: Emergency Medicine

## 2020-02-02 ENCOUNTER — Other Ambulatory Visit: Payer: Self-pay

## 2020-02-02 DIAGNOSIS — J9611 Chronic respiratory failure with hypoxia: Secondary | ICD-10-CM | POA: Diagnosis not present

## 2020-02-02 DIAGNOSIS — Z89421 Acquired absence of other right toe(s): Secondary | ICD-10-CM

## 2020-02-02 DIAGNOSIS — M869 Osteomyelitis, unspecified: Secondary | ICD-10-CM | POA: Diagnosis not present

## 2020-02-02 DIAGNOSIS — G4733 Obstructive sleep apnea (adult) (pediatric): Secondary | ICD-10-CM | POA: Diagnosis present

## 2020-02-02 DIAGNOSIS — E039 Hypothyroidism, unspecified: Secondary | ICD-10-CM | POA: Diagnosis present

## 2020-02-02 DIAGNOSIS — E1165 Type 2 diabetes mellitus with hyperglycemia: Secondary | ICD-10-CM | POA: Diagnosis present

## 2020-02-02 DIAGNOSIS — Z872 Personal history of diseases of the skin and subcutaneous tissue: Secondary | ICD-10-CM | POA: Diagnosis not present

## 2020-02-02 DIAGNOSIS — T797XXA Traumatic subcutaneous emphysema, initial encounter: Secondary | ICD-10-CM | POA: Diagnosis not present

## 2020-02-02 DIAGNOSIS — E871 Hypo-osmolality and hyponatremia: Secondary | ICD-10-CM | POA: Diagnosis not present

## 2020-02-02 DIAGNOSIS — S91301A Unspecified open wound, right foot, initial encounter: Secondary | ICD-10-CM | POA: Diagnosis present

## 2020-02-02 DIAGNOSIS — K219 Gastro-esophageal reflux disease without esophagitis: Secondary | ICD-10-CM | POA: Diagnosis present

## 2020-02-02 DIAGNOSIS — Z825 Family history of asthma and other chronic lower respiratory diseases: Secondary | ICD-10-CM

## 2020-02-02 DIAGNOSIS — I5032 Chronic diastolic (congestive) heart failure: Secondary | ICD-10-CM | POA: Diagnosis not present

## 2020-02-02 DIAGNOSIS — H5462 Unqualified visual loss, left eye, normal vision right eye: Secondary | ICD-10-CM | POA: Diagnosis present

## 2020-02-02 DIAGNOSIS — E1151 Type 2 diabetes mellitus with diabetic peripheral angiopathy without gangrene: Secondary | ICD-10-CM | POA: Diagnosis present

## 2020-02-02 DIAGNOSIS — E1169 Type 2 diabetes mellitus with other specified complication: Secondary | ICD-10-CM | POA: Diagnosis present

## 2020-02-02 DIAGNOSIS — Z01810 Encounter for preprocedural cardiovascular examination: Secondary | ICD-10-CM | POA: Diagnosis not present

## 2020-02-02 DIAGNOSIS — T8743 Infection of amputation stump, right lower extremity: Principal | ICD-10-CM | POA: Diagnosis present

## 2020-02-02 DIAGNOSIS — E782 Mixed hyperlipidemia: Secondary | ICD-10-CM | POA: Diagnosis present

## 2020-02-02 DIAGNOSIS — Z20822 Contact with and (suspected) exposure to covid-19: Secondary | ICD-10-CM | POA: Diagnosis not present

## 2020-02-02 DIAGNOSIS — E1122 Type 2 diabetes mellitus with diabetic chronic kidney disease: Secondary | ICD-10-CM | POA: Diagnosis present

## 2020-02-02 DIAGNOSIS — G4700 Insomnia, unspecified: Secondary | ICD-10-CM | POA: Diagnosis present

## 2020-02-02 DIAGNOSIS — Z794 Long term (current) use of insulin: Secondary | ICD-10-CM

## 2020-02-02 DIAGNOSIS — Z9981 Dependence on supplemental oxygen: Secondary | ICD-10-CM

## 2020-02-02 DIAGNOSIS — Z833 Family history of diabetes mellitus: Secondary | ICD-10-CM

## 2020-02-02 DIAGNOSIS — I480 Paroxysmal atrial fibrillation: Secondary | ICD-10-CM | POA: Diagnosis present

## 2020-02-02 DIAGNOSIS — Z1810 Retained metal fragments, unspecified: Secondary | ICD-10-CM | POA: Diagnosis not present

## 2020-02-02 DIAGNOSIS — Z96653 Presence of artificial knee joint, bilateral: Secondary | ICD-10-CM | POA: Diagnosis present

## 2020-02-02 DIAGNOSIS — I10 Essential (primary) hypertension: Secondary | ICD-10-CM | POA: Diagnosis not present

## 2020-02-02 DIAGNOSIS — I130 Hypertensive heart and chronic kidney disease with heart failure and stage 1 through stage 4 chronic kidney disease, or unspecified chronic kidney disease: Secondary | ICD-10-CM | POA: Diagnosis present

## 2020-02-02 DIAGNOSIS — Z7984 Long term (current) use of oral hypoglycemic drugs: Secondary | ICD-10-CM

## 2020-02-02 DIAGNOSIS — M47812 Spondylosis without myelopathy or radiculopathy, cervical region: Secondary | ICD-10-CM | POA: Diagnosis present

## 2020-02-02 DIAGNOSIS — E876 Hypokalemia: Secondary | ICD-10-CM | POA: Diagnosis not present

## 2020-02-02 DIAGNOSIS — N1831 Chronic kidney disease, stage 3a: Secondary | ICD-10-CM | POA: Diagnosis present

## 2020-02-02 DIAGNOSIS — Z89422 Acquired absence of other left toe(s): Secondary | ICD-10-CM

## 2020-02-02 DIAGNOSIS — Z881 Allergy status to other antibiotic agents status: Secondary | ICD-10-CM

## 2020-02-02 DIAGNOSIS — I2510 Atherosclerotic heart disease of native coronary artery without angina pectoris: Secondary | ICD-10-CM | POA: Diagnosis not present

## 2020-02-02 DIAGNOSIS — L03115 Cellulitis of right lower limb: Principal | ICD-10-CM | POA: Diagnosis present

## 2020-02-02 DIAGNOSIS — Z7901 Long term (current) use of anticoagulants: Secondary | ICD-10-CM

## 2020-02-02 DIAGNOSIS — I739 Peripheral vascular disease, unspecified: Secondary | ICD-10-CM | POA: Diagnosis not present

## 2020-02-02 DIAGNOSIS — I951 Orthostatic hypotension: Secondary | ICD-10-CM | POA: Diagnosis present

## 2020-02-02 DIAGNOSIS — Z89411 Acquired absence of right great toe: Secondary | ICD-10-CM

## 2020-02-02 DIAGNOSIS — Z888 Allergy status to other drugs, medicaments and biological substances status: Secondary | ICD-10-CM

## 2020-02-02 DIAGNOSIS — Y835 Amputation of limb(s) as the cause of abnormal reaction of the patient, or of later complication, without mention of misadventure at the time of the procedure: Secondary | ICD-10-CM | POA: Diagnosis present

## 2020-02-02 DIAGNOSIS — Z955 Presence of coronary angioplasty implant and graft: Secondary | ICD-10-CM

## 2020-02-02 DIAGNOSIS — Z79899 Other long term (current) drug therapy: Secondary | ICD-10-CM

## 2020-02-02 DIAGNOSIS — Z981 Arthrodesis status: Secondary | ICD-10-CM

## 2020-02-02 DIAGNOSIS — S91309A Unspecified open wound, unspecified foot, initial encounter: Secondary | ICD-10-CM | POA: Diagnosis not present

## 2020-02-02 DIAGNOSIS — Z87891 Personal history of nicotine dependence: Secondary | ICD-10-CM

## 2020-02-02 DIAGNOSIS — J449 Chronic obstructive pulmonary disease, unspecified: Secondary | ICD-10-CM | POA: Diagnosis not present

## 2020-02-02 DIAGNOSIS — I110 Hypertensive heart disease with heart failure: Secondary | ICD-10-CM | POA: Diagnosis not present

## 2020-02-02 DIAGNOSIS — G629 Polyneuropathy, unspecified: Secondary | ICD-10-CM | POA: Diagnosis present

## 2020-02-02 DIAGNOSIS — Z885 Allergy status to narcotic agent status: Secondary | ICD-10-CM

## 2020-02-02 DIAGNOSIS — Z8249 Family history of ischemic heart disease and other diseases of the circulatory system: Secondary | ICD-10-CM

## 2020-02-02 DIAGNOSIS — I70221 Atherosclerosis of native arteries of extremities with rest pain, right leg: Secondary | ICD-10-CM | POA: Diagnosis not present

## 2020-02-02 DIAGNOSIS — Z89412 Acquired absence of left great toe: Secondary | ICD-10-CM

## 2020-02-02 LAB — CBC WITH DIFFERENTIAL/PLATELET
Abs Immature Granulocytes: 0.16 10*3/uL — ABNORMAL HIGH (ref 0.00–0.07)
Basophils Absolute: 0 10*3/uL (ref 0.0–0.1)
Basophils Relative: 0 %
Eosinophils Absolute: 0.1 10*3/uL (ref 0.0–0.5)
Eosinophils Relative: 1 %
HCT: 29.4 % — ABNORMAL LOW (ref 39.0–52.0)
Hemoglobin: 8.8 g/dL — ABNORMAL LOW (ref 13.0–17.0)
Immature Granulocytes: 2 %
Lymphocytes Relative: 10 %
Lymphs Abs: 0.9 10*3/uL (ref 0.7–4.0)
MCH: 22.1 pg — ABNORMAL LOW (ref 26.0–34.0)
MCHC: 29.9 g/dL — ABNORMAL LOW (ref 30.0–36.0)
MCV: 73.9 fL — ABNORMAL LOW (ref 80.0–100.0)
Monocytes Absolute: 2.2 10*3/uL — ABNORMAL HIGH (ref 0.1–1.0)
Monocytes Relative: 23 %
Neutro Abs: 6.2 10*3/uL (ref 1.7–7.7)
Neutrophils Relative %: 64 %
Platelets: 251 10*3/uL (ref 150–400)
RBC: 3.98 MIL/uL — ABNORMAL LOW (ref 4.22–5.81)
RDW: 24.1 % — ABNORMAL HIGH (ref 11.5–15.5)
WBC: 9.5 10*3/uL (ref 4.0–10.5)
nRBC: 0 % (ref 0.0–0.2)

## 2020-02-02 LAB — BASIC METABOLIC PANEL
Anion gap: 12 (ref 5–15)
BUN: 22 mg/dL (ref 8–23)
CO2: 26 mmol/L (ref 22–32)
Calcium: 8.7 mg/dL — ABNORMAL LOW (ref 8.9–10.3)
Chloride: 96 mmol/L — ABNORMAL LOW (ref 98–111)
Creatinine, Ser: 1.4 mg/dL — ABNORMAL HIGH (ref 0.61–1.24)
GFR, Estimated: 50 mL/min — ABNORMAL LOW (ref >60)
Glucose, Bld: 197 mg/dL — ABNORMAL HIGH (ref 70–99)
Potassium: 3.7 mmol/L (ref 3.5–5.1)
Sodium: 134 mmol/L — ABNORMAL LOW (ref 135–145)

## 2020-02-02 LAB — RESPIRATORY PANEL BY RT PCR (FLU A&B, COVID)
Influenza A by PCR: NEGATIVE
Influenza B by PCR: NEGATIVE
SARS Coronavirus 2 by RT PCR: NEGATIVE

## 2020-02-02 MED ORDER — UMECLIDINIUM BROMIDE 62.5 MCG/INH IN AEPB
1.0000 | INHALATION_SPRAY | Freq: Every day | RESPIRATORY_TRACT | Status: DC
  Administered 2020-02-04 – 2020-02-07 (×3): 1 via RESPIRATORY_TRACT
  Filled 2020-02-02 (×2): qty 7

## 2020-02-02 MED ORDER — CIPROFLOXACIN IN D5W 400 MG/200ML IV SOLN
400.0000 mg | Freq: Once | INTRAVENOUS | Status: AC
  Administered 2020-02-02: 400 mg via INTRAVENOUS
  Filled 2020-02-02: qty 200

## 2020-02-02 MED ORDER — ACETAMINOPHEN 325 MG PO TABS
650.0000 mg | ORAL_TABLET | Freq: Four times a day (QID) | ORAL | Status: DC | PRN
  Administered 2020-02-06: 650 mg via ORAL
  Filled 2020-02-02: qty 2

## 2020-02-02 MED ORDER — LISINOPRIL 2.5 MG PO TABS
2.5000 mg | ORAL_TABLET | Freq: Every day | ORAL | Status: DC
  Administered 2020-02-03 – 2020-02-07 (×5): 2.5 mg via ORAL
  Filled 2020-02-02 (×5): qty 1

## 2020-02-02 MED ORDER — INSULIN GLARGINE 100 UNIT/ML ~~LOC~~ SOLN
15.0000 [IU] | Freq: Two times a day (BID) | SUBCUTANEOUS | Status: DC
  Administered 2020-02-03 – 2020-02-05 (×6): 15 [IU] via SUBCUTANEOUS
  Filled 2020-02-02 (×8): qty 0.15

## 2020-02-02 MED ORDER — ARGATROBAN 50 MG/50ML IV SOLN
0.8500 ug/kg/min | INTRAVENOUS | Status: DC
  Administered 2020-02-03 (×2): 1 ug/kg/min via INTRAVENOUS
  Administered 2020-02-04: 0.85 ug/kg/min via INTRAVENOUS
  Filled 2020-02-02 (×6): qty 50

## 2020-02-02 MED ORDER — TRAZODONE HCL 50 MG PO TABS
50.0000 mg | ORAL_TABLET | Freq: Every day | ORAL | Status: DC
  Administered 2020-02-03 – 2020-02-06 (×5): 50 mg via ORAL
  Filled 2020-02-02 (×5): qty 1

## 2020-02-02 MED ORDER — SODIUM CHLORIDE 0.9 % IV SOLN
2.0000 g | INTRAVENOUS | Status: DC
  Administered 2020-02-02 – 2020-02-05 (×4): 2 g via INTRAVENOUS
  Filled 2020-02-02 (×3): qty 20
  Filled 2020-02-02 (×2): qty 2

## 2020-02-02 MED ORDER — ATORVASTATIN CALCIUM 80 MG PO TABS
80.0000 mg | ORAL_TABLET | Freq: Every evening | ORAL | Status: DC
  Administered 2020-02-03 – 2020-02-06 (×5): 80 mg via ORAL
  Filled 2020-02-02 (×5): qty 1

## 2020-02-02 MED ORDER — VANCOMYCIN HCL 1500 MG/300ML IV SOLN
1500.0000 mg | Freq: Once | INTRAVENOUS | Status: AC
  Administered 2020-02-02: 1500 mg via INTRAVENOUS
  Filled 2020-02-02: qty 300

## 2020-02-02 MED ORDER — ISOSORBIDE MONONITRATE ER 30 MG PO TB24
30.0000 mg | ORAL_TABLET | Freq: Two times a day (BID) | ORAL | Status: DC
  Administered 2020-02-03 – 2020-02-07 (×10): 30 mg via ORAL
  Filled 2020-02-02 (×10): qty 1

## 2020-02-02 MED ORDER — ACETAMINOPHEN 650 MG RE SUPP: 650.0000 mg | Freq: Four times a day (QID) | RECTAL | Status: DC | PRN

## 2020-02-02 MED ORDER — IPRATROPIUM-ALBUTEROL 0.5-2.5 (3) MG/3ML IN SOLN
3.0000 mL | Freq: Every evening | RESPIRATORY_TRACT | Status: DC
  Administered 2020-02-03: 3 mL via RESPIRATORY_TRACT
  Filled 2020-02-02 (×2): qty 3

## 2020-02-02 MED ORDER — INSULIN ASPART 100 UNIT/ML ~~LOC~~ SOLN
0.0000 [IU] | Freq: Three times a day (TID) | SUBCUTANEOUS | Status: DC
  Administered 2020-02-03 – 2020-02-04 (×2): 2 [IU] via SUBCUTANEOUS
  Administered 2020-02-04: 1 [IU] via SUBCUTANEOUS
  Administered 2020-02-05 (×2): 3 [IU] via SUBCUTANEOUS
  Administered 2020-02-05: 5 [IU] via SUBCUTANEOUS
  Administered 2020-02-06 – 2020-02-07 (×4): 2 [IU] via SUBCUTANEOUS

## 2020-02-02 MED ORDER — POLYETHYLENE GLYCOL 3350 17 G PO PACK: 17.0000 g | PACK | Freq: Every day | ORAL | Status: DC | PRN

## 2020-02-02 MED ORDER — PANTOPRAZOLE SODIUM 40 MG PO TBEC
40.0000 mg | DELAYED_RELEASE_TABLET | Freq: Two times a day (BID) | ORAL | Status: DC
  Administered 2020-02-02 – 2020-02-07 (×10): 40 mg via ORAL
  Filled 2020-02-02 (×10): qty 1

## 2020-02-02 MED ORDER — VANCOMYCIN HCL 1250 MG/250ML IV SOLN
1250.0000 mg | INTRAVENOUS | Status: DC
  Administered 2020-02-04 – 2020-02-05 (×3): 1250 mg via INTRAVENOUS
  Filled 2020-02-02 (×4): qty 250

## 2020-02-02 NOTE — Progress Notes (Signed)
ANTICOAGULATION CONSULT NOTE - Initial Consult  Pharmacy Consult for argatroban Indication: atrial fibrillation, hx HIT  Allergies  Allergen Reactions  . Bactrim [Sulfamethoxazole-Trimethoprim] Other (See Comments)    Pancytopenia  . Codeine Shortness Of Breath  . Doxycycline Swelling  . Feraheme [Ferumoxytol] Other (See Comments)    Diaphoretic, chest pain  . Heparin Other (See Comments)    +HIT,  Severe bleeding (with heparin drip & large doses), tolerates low doses  . Losartan Swelling  . Oxycodone Other (See Comments)    "Made me act out of my mind" Mental status changes- hallucinations  . Latex Rash    Patient Measurements: Height: 5\' 7"  (170.2 cm) Weight: 82.6 kg (182 lb) IBW/kg (Calculated) : 66.1  Vital Signs: Temp: 97.9 F (36.6 C) (11/07 1554) Temp Source: Oral (11/07 1554) BP: 143/90 (11/07 2100) Pulse Rate: 78 (11/07 2100)  Labs: Recent Labs    02/02/20 1603  HGB 8.8*  HCT 29.4*  PLT 251  CREATININE 1.40*    Estimated Creatinine Clearance: 40.4 mL/min (A) (by C-G formula based on SCr of 1.4 mg/dL (H)).   Medical History: Past Medical History:  Diagnosis Date  . Anemia    a. mild, noted 04/2017.  [name] CAD in native artery    a. [name] 04/2017 s/p DES to D1, DES to prox-mid LAD, DES to prox LAD overlapping the prior stent, LVEF 55-65%.   . Chronic diastolic CHF (congestive heart failure) ([place])   . Constipation   . COPD (chronic obstructive pulmonary disease) ([place])   . Diabetic ulcer of toe ([place])   . DJD (degenerative joint disease) of cervical spine   . Dysrhythmia    AFib  . Essential hypertension   . GERD (gastroesophageal reflux disease)   . History of hiatal hernia   . HIT (heparin-induced thrombocytopenia) ([place])   . Hypothyroidism   . Hypoxia    a. went home on home O2 04/2017.  [name] Insomnia   . Mixed hyperlipidemia   . PAD (peripheral artery disease) ([place])   . PAF (paroxysmal atrial fibrillation) ([place])   . PVD (peripheral vascular disease)  ([place])   . Renal insufficiency   . 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  . Retinitis   . Sinus bradycardia    a. HR 30s-40s in 04/2017 -> diltiazem stopped, metoprolol reduced.  . Sleep apnea    "chose not to order CPAP at this time" (05/18/2017)  . Type 2 diabetes mellitus ([place])   . Wears glasses   . Wheezing    a. suspected COPD 04/2017. Former tobacco x 40 years.    Medications:  Infusions:  . argatroban    . cefTRIAXone (ROCEPHIN)  IV 2 g (02/02/20 2143)  . vancomycin     Followed by  . [START ON 02/03/2020] vancomycin      Assessment: 86 yo male on chronic Eliquis for afib.  Admitted with foot infection, likely need for amputation, and Eliquis placed on hold.  Pt has history of HIT, so pharmacy asked to begin anticoagulation with IV argatroban.  Last dose of Eliquis taken 9 AM today.  Goal of Therapy:  aPTT 50-90 seconds Monitor platelets by anticoagulation protocol: Yes   Plan:  Start argatroban 1 mcg/kg/min. Check aPTT in 4 hrs. Daily aPTT and CBC.  [place] Crane, Roylene Reason, BCCP Clinical Pharmacist  02/02/2020 9:58 PM   [facility] pharmacy phone numbers are listed on [email or web address]

## 2020-02-02 NOTE — Consult Note (Signed)
Hospital Consult    Reason for Consult:  R TMA infection Referring Physician:  Dr. Tyrone Nine MRN #:  [id number]  History of Present Illness: This is a 86 y.o. 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  He also has a left great toe amputation.  He had posterior tibial balloon angioplasty on the right in 2019.  He is unsure if he has had other interventions of the right lower extremity.  For the past few days he has had worsening redness and drainage from the right foot.  Today began having streaking up the right foot with warmth in the foot and called the office was instructed to present to the ER.  He does not have nausea or vomiting.  Denies fever.  He is on Eliquis.  Past Medical History:  Diagnosis Date  . Anemia    a. mild, noted 04/2017.  [name] CAD in native artery    a. [name] 04/2017 s/p DES to D1, DES to prox-mid LAD, DES to prox LAD overlapping the prior stent, LVEF 55-65%.   . Chronic diastolic CHF (congestive heart failure) ([place])   . Constipation   . COPD (chronic obstructive pulmonary disease) ([place])   . Diabetic ulcer of toe ([place])   . DJD (degenerative joint disease) of cervical spine   . Dysrhythmia    AFib  . Essential hypertension   . GERD (gastroesophageal reflux disease)   . History of hiatal hernia   . HIT (heparin-induced thrombocytopenia) ([place])   . Hypothyroidism   . Hypoxia    a. went home on home O2 04/2017.  [name] Insomnia   . Mixed hyperlipidemia   . PAD (peripheral artery disease) ([place])   . PAF (paroxysmal atrial fibrillation) ([place])   . PVD (peripheral vascular disease) ([place])   . Renal insufficiency   . 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  . Retinitis   . Sinus bradycardia    a. HR 30s-40s in 04/2017 -> diltiazem stopped, metoprolol reduced.  . Sleep apnea    "chose not to order CPAP at this time" (05/18/2017)  . Type 2 diabetes mellitus ([place])   . Wears glasses   . Wheezing    a. suspected COPD 04/2017. Former tobacco x 40 years.     Past Surgical History:  Procedure Laterality Date  . ABDOMINAL AORTOGRAM W/LOWER EXTREMITY N/A 09/15/2017   Procedure: ABDOMINAL AORTOGRAM W/LOWER EXTREMITY;  Surgeon: Serafina Mitchell, MD;  Location: [facility];  Service: Cardiovascular;  Laterality: N/A;  . AMPUTATION Bilateral 09/20/2017   Procedure: BILATERAL GREAT TOE AMPUTATIONS INCLUDING METATARSAL HEADS;  Surgeon: Serafina Mitchell, MD;  [facility];  Service: Vascular;  Laterality: Bilateral;  . AMPUTATION Right 03/01/2019   Procedure: AMPUTATION TOES;  Surgeon: Angelia Mould, MD;  Location: [facility];  Service: Vascular;  Laterality: Right;  . AMPUTATION Right 05/29/2019   Procedure: AMPUTATION RIGHT THIRD TOE;  Surgeon: Serafina Mitchell, MD;  Location: [facility];  Service: Vascular;  Laterality: Right;  . AMPUTATION Right 09/26/2019   Procedure: AMPUTATION RIGHT FOURTH TOE AND FIFTH TOES;  Surgeon: Serafina Mitchell, MD;  [facility]: [facility];  Service: Vascular;  Laterality: Right;  . ANGIOPLASTY Right 09/20/2017   Procedure: ANGIOPLASTY RIGHT TIBIAL ARTERY;  Surgeon: Serafina Mitchell, MD;  Location: [place];  Service: Vascular;  Laterality: Right;  . APPENDECTOMY    . BACK SURGERY    . CARDIAC CATHETERIZATION  1980s; 2012;  [name]  CATARACT EXTRACTION Left 2004  . COLONOSCOPY  2004   FLEISHMAN TICS  . COLONOSCOPY WITH PROPOFOL N/A 11/28/2018   Procedure: COLONOSCOPY WITH PROPOFOL;  Surgeon: Rogene Houston, MD;  [facility];  Service: Endoscopy;  Laterality: N/A;  . CORONARY ANGIOPLASTY WITH STENT PLACEMENT  05/18/2017   "3 stents"  . CORONARY STENT INTERVENTION N/A 05/18/2017   Procedure: CORONARY STENT INTERVENTION;  Surgeon: Jettie Booze, MD;  Location: [facility];  Service: Cardiovascular;  Laterality: N/A;  . ESOPHAGOGASTRODUODENOSCOPY (EGD) WITH PROPOFOL N/A 11/20/2017   Procedure: ESOPHAGOGASTRODUODENOSCOPY (EGD) WITH PROPOFOL;  Surgeon: Irene Shipper, MD;  Location: [facility];  Service:  Gastroenterology;  Laterality: N/A;  . ESOPHAGOGASTRODUODENOSCOPY (EGD) WITH PROPOFOL N/A 11/28/2018   Procedure: ESOPHAGOGASTRODUODENOSCOPY (EGD) WITH PROPOFOL;  Surgeon: Rogene Houston, MD;  [facility];  Service: Endoscopy;  Laterality: N/A;  . ESOPHAGOGASTRODUODENOSCOPY (EGD) WITH PROPOFOL N/A 07/12/2019   Procedure: ESOPHAGOGASTRODUODENOSCOPY (EGD) WITH PROPOFOL;  Surgeon: Rogene Houston, MD;  [facility];  Service: Endoscopy;  Laterality: N/A;  125  . GIVENS CAPSULE STUDY N/A 12/17/2018   Procedure: GIVENS CAPSULE STUDY;  Surgeon: Rogene Houston, MD;  [facility];  Service: Endoscopy;  Laterality: N/A;  7:30am  . I & D EXTREMITY Right 12/17/2017   Procedure: IRRIGATION AND DEBRIDEMENT RIGHT GREAT TOE;  Surgeon: Serafina Mitchell, MD;  [facility];  Service: Vascular;  Laterality: Right;  . JOINT REPLACEMENT    . LEFT HEART CATH AND CORONARY ANGIOGRAPHY N/A 05/18/2017   Procedure: LEFT HEART CATH AND CORONARY ANGIOGRAPHY;  Surgeon: Jettie Booze, MD;  Location: [facility];  Service: Cardiovascular;  Laterality: N/A;  . LOWER EXTREMITY ANGIOGRAM Right 09/20/2017   Procedure: RIGHT LOWER LEG  ANGIOGRAM;  Surgeon: Serafina Mitchell, MD;  Location: [facility];  Service: Vascular;  Laterality: Right;  . LUMBAR FUSION  2002   L3, 4 L4, 5 L5 S1 Fused by Dr. Glenna Fellows  . PERIPHERAL VASCULAR BALLOON ANGIOPLASTY Left 09/15/2017   Procedure: PERIPHERAL VASCULAR BALLOON ANGIOPLASTY;  Surgeon: Serafina Mitchell, MD;  Location: [facility];  Service: Cardiovascular;  Laterality: Left;  PTA of Peroneal & Posterior Tibial  . POLYPECTOMY  11/28/2018   Procedure: POLYPECTOMY;  Surgeon: Rogene Houston, MD;  [facility];  Service: Endoscopy;;  duodenum  . POSTERIOR LUMBAR FUSION    . RHINOPLASTY    . RIGHT HEART CATH N/A 05/18/2017   Procedure: RIGHT HEART CATH;  Surgeon: Jettie Booze, MD;  Location: [facility];  Service:  Cardiovascular;  Laterality: N/A;  . TEE WITHOUT CARDIOVERSION N/A 09/18/2017   Procedure: TRANSESOPHAGEAL ECHOCARDIOGRAM (TEE);  Surgeon: Fay Records, MD;  Location: [facility];  Service: Cardiovascular;  Laterality: N/A;  . TOTAL KNEE ARTHROPLASTY Bilateral   . TRANSURETHRAL RESECTION OF PROSTATE  2001   [place]    Allergies  Allergen Reactions  . Bactrim [Sulfamethoxazole-Trimethoprim]     Pancytopenia  . Codeine Shortness Of Breath  . Doxycycline Swelling  . Heparin Other (See Comments)    +HIT,  Severe bleeding (with heparin drip & large doses), tolerates low doses  . Losartan Swelling    Per ENT UNSPECIFIED REACTION   . Oxycodone Other (See Comments)    "Made me act out of my mind" Mental status changes hallucinations  . Feraheme [Ferumoxytol]     Diaphoretic, chest pain    Prior to Admission medications   Medication  Sig Start Date End Date Taking? Authorizing Provider  acetaminophen (TYLENOL) 500 MG tablet Take 1 tablet (500 mg total) by mouth every 6 (six) hours as needed for mild pain or headache. 03/03/19   Allie Bossier, MD  atorvastatin (LIPITOR) 80 MG tablet Take 80 mg by mouth every evening.  05/23/19   [provider]  Cholecalciferol (VITAMIN D) 50 MCG (2000 UT) tablet Take 2,000 Units by mouth daily.    [provider]  Cyanocobalamin (B-12) 2500 MCG TABS Take 2,500 mcg by mouth daily.    [provider]  DROPLET INSULIN SYRINGE 31G X 5/16" 1 ML [place]  09/24/19   [provider]  ELIQUIS 5 MG TABS tablet Take 5 mg by mouth 2 (two) times daily. 09/24/19   [provider]  folic acid (FOLVITE) 1 MG tablet Take 1 tablet (1 mg total) by mouth daily. 12/06/17   Oswald Hillock, MD  furosemide (LASIX) 80 MG tablet Take 1 tablet by mouth in the morning and at bedtime. 11/19/19   [provider]  insulin glargine (LANTUS) 100 UNIT/ML injection Inject 30-40 Units into the skin See admin instructions. Inject 30 units SQ in  the morning and inject 40 units SQ at bedtime    [provider]  isosorbide mononitrate (IMDUR) 30 MG 24 hr tablet Take 1 tablet (30 mg total) by mouth in the morning and at bedtime. 01/21/20 04/20/20  Imogene Burn, PA-C  lisinopril (ZESTRIL) 5 MG tablet Take 0.5 tablets (2.5 mg total) by mouth daily. 11/29/18   Barton Dubois, MD  meclizine (ANTIVERT) 25 MG tablet Take 25 mg by mouth daily as needed for dizziness.    [provider]  metFORMIN (GLUCOPHAGE) 1000 MG tablet Take 1,000 mg by mouth 2 (two) times daily. 05/22/17   [provider]  nitroGLYCERIN (NITROSTAT) 0.4 MG SL tablet PLACE 1 TABLET (0.4 MG TOTAL) UNDER THE TONGUE EVERY 5  MINUTES AS NEEDED FOR CHEST PAIN. Patient taking differently: Place 0.4 mg under the tongue every 5 (five) minutes as needed for chest pain.  05/22/17   Arnoldo Lenis, MD  pantoprazole (PROTONIX) 40 MG tablet Take 1 tablet (40 mg total) by mouth 2 (two) times daily. To protect stomach while taking multiple blood thinners. 11/29/18   Barton Dubois, MD  potassium chloride SA (K-DUR) 20 MEQ tablet Take 1 tablet (20 mEq total) by mouth daily. 11/30/18   Barton Dubois, MD  psyllium (METAMUCIL) 58.6 % packet Take 0.5 packets by mouth 2 (two) times daily.     [provider]  traMADol (ULTRAM) 50 MG tablet Take 1 tablet (50 mg total) by mouth every 8 (eight) hours as needed. 09/26/19   Rhyne, Hulen Shouts, PA-C  traZODone (DESYREL) 100 MG tablet Take 100 mg by mouth at bedtime.  03/25/16   [provider]  TRUE METRIX BLOOD GLUCOSE TEST test strip  07/12/19   [provider]    Social History   Socioeconomic History  . Marital status: Married    Spouse name: Not on file  . Number of children: Not on file  . Years of education: Not on file  . Highest education level: Not on file  Occupational History  . Not on file  Tobacco Use  . Smoking status: Former Smoker    Packs/day: 2.00    Years: 30.00    Pack years:  60.00    Types: Cigarettes    Start date: 03/28/1953    Quit date:  03/29/1983    Years since quitting: 36.8  . Smokeless tobacco: Never Used  Vaping Use  . Vaping Use: Never used  Substance and Sexual Activity  . Alcohol use: Not Currently    Comment: 05/18/2017 'I drink a beer q yr"  . Drug use: No  . Sexual activity: Not Currently  Other Topics Concern  . Not on file  Social History Narrative   Patient lives at home with his spouse.    Social Determinants of Health   Financial Resource Strain:   . Difficulty of Paying Living Expenses: Not on file  Food Insecurity:   . Worried About Charity fundraiser in the Last Year: Not on file  . Ran Out of Food in the Last Year: Not on file  Transportation Needs: No Transportation Needs  . Lack of Transportation (Medical): No  . Lack of Transportation (Non-Medical): No  Physical Activity:   . Days of Exercise per Week: Not on file  . Minutes of Exercise per Session: Not on file  Stress: No Stress Concern Present  . Feeling of Stress : Not at all  Social Connections:   . Frequency of Communication with Friends and Family: Not on file  . Frequency of Social Gatherings with Friends and Family: Not on file  . Attends Religious Services: Not on file  . Active Member of Clubs or Organizations: Not on file  . Attends Archivist Meetings: Not on file  . Marital Status: Not on file  Intimate Partner Violence:   . Fear of Current or Ex-Partner: Not on file  . Emotionally Abused: Not on file  . Physically Abused: Not on file  . Sexually Abused: Not on file     Family History  Problem Relation Age of Onset  . Heart attack Father 56  . COPD Father   . COPD Mother   . Heart disease Mother   . Diabetes Mother   . Hypertension Sister   . CVA Sister   . Diabetes Sister   . Multiple sclerosis Sister     ROS:  Cardiovascular: []  chest pain/pressure []  palpitations []  SOB lying flat []  DOE []  pain in legs while walking []   pain in legs at rest x[]  pain in legs at night []  non-healing ulcers []  hx of DVT [x]  swelling in legs  Pulmonary: []  productive cough []  asthma/wheezing []  home O2  Neurologic: []  weakness in []  arms []  legs []  numbness in []  arms []  legs []  hx of CVA []  mini stroke [] difficulty speaking or slurred speech []  temporary loss of vision in one eye []  dizziness  Hematologic: []  hx of cancer []  bleeding problems []  problems with blood clotting easily  Endocrine:   [x]  diabetes []  thyroid disease  GI []  vomiting blood []  blood in stool  GU: []  CKD/renal failure []  HD--[]  M/W/F or []  T/T/S []  burning with urination []  blood in urine  Psychiatric: []  anxiety []  depression  Musculoskeletal: []  arthritis []  joint pain  Integumentary: [x]  rashes [x]  ulcers  Constitutional: []  fever []  chills   Physical Examination  Vitals:   02/02/20 1815 02/02/20 1830  BP: 128/65 104/85  Pulse: 78 84  Resp:    Temp:    SpO2: 99% 98%   Body mass index is 28.51 kg/m.  General:  nad HENT: WNL, normocephalic Pulmonary: normal non-labored breathing Cardiac: Palpable radial and popliteal artery bilaterally There are strong dorsalis pedis and posterior tibial signals on the right foot Abdomen:  soft, NT/ND,  no masses Extremities: There is an eschar on the medial aspect of the TMA and a draining likely pressure ulceration in the middle on the plantar surface There is erythema up to the level of the ankle joint on the right foot and is warm to the touch  Neurologic: A&O X 3; Appropriate Affect ; SENSATION: normal; MOTOR FUNCTION:  moving all extremities equally. Speech is fluent/normal   CBC    Component Value Date/Time   WBC 9.5 02/02/2020 1603   RBC 3.98 (L) 02/02/2020 1603   HGB 8.8 (L) 02/02/2020 1603   HGB 12.3 (L) 05/30/2017 1531   HCT 29.4 (L) 02/02/2020 1603   HCT 37.7 05/30/2017 1531   PLT 251 02/02/2020 1603   PLT 212 05/30/2017 1531   MCV 73.9 (L)  02/02/2020 1603   MCV 86 05/30/2017 1531   MCH 22.1 (L) 02/02/2020 1603   MCHC 29.9 (L) 02/02/2020 1603   RDW 24.1 (H) 02/02/2020 1603   RDW 16.0 (H) 05/30/2017 1531   LYMPHSABS 0.9 02/02/2020 1603   MONOABS 2.2 (H) 02/02/2020 1603   EOSABS 0.1 02/02/2020 1603   BASOSABS 0.0 02/02/2020 1603    BMET    Component Value Date/Time   NA 139 12/04/2019 1416   NA 139 05/30/2017 1531   K 4.4 12/04/2019 1416   CL 100 12/04/2019 1416   CO2 29 12/04/2019 1416   GLUCOSE 100 (H) 12/04/2019 1416   BUN 14 12/04/2019 1416   BUN 13 05/30/2017 1531   CREATININE 1.16 12/04/2019 1416   CREATININE 1.00 01/04/2018 1401   CALCIUM 8.8 (L) 12/04/2019 1416   GFRNONAA 58 (L) 12/04/2019 1416   GFRNONAA >60 01/04/2018 1401   GFRAA >60 12/04/2019 1416   GFRAA >60 01/04/2018 1401    COAGS: Lab Results  Component Value Date   INR 1.3 (H) 09/26/2019   INR 1.2 05/29/2019   INR 1.07 12/13/2017     Non-Invasive Vascular Imaging:   ABI, rle duplex ordered  Most recent right lower extremity duplex demonstrated 50 to 74% stenosis in the proximal SFA on the right.  ABI on the right was 0.82 and biphasic and on the left was 0.93.  Toe pressures could not be measured due to presence of amputations.  Right foot xray IMPRESSION: Possible cortical irregularity at the fifth metatarsal head and remaining portion of the third proximal phalanx with overlying soft tissue swelling and subcutaneous emphysema. This could be from osteomyelitis.  ASSESSMENT/PLAN: This is a 86 y.o. male history of toe amputations as noted above.  Now appears to have draining infection of the right distal amputation overlying the third metatarsal head and on the medial aspect at the first metatarsal head.  I discussed with him that he would likely need formal transmetatarsal amputation during this hospitalization.  He will also likely require angiography from the left common femoral approach.  Eliquis needs to be held.  I have ordered  duplex of the right lower extremity as well as ABIs.   Greydon Betke C. Donzetta Matters, MD [facility] Office: [phone] Pager: [phone]

## 2020-02-02 NOTE — ED Provider Notes (Signed)
[facility] EMERGENCY DEPARTMENT Provider Note   CSN: [id number] Arrival date & time: 02/02/20  1549     History Chief Complaint  Patient presents with  . Wound Infection    ROI Mike Wright is a 86 y.o. male.  86 year old male with a chief complaints of worsening wound to his right foot.  Patient has had a partial amputation of his toes of the right foot.  Started noticing a wound over the past few days and started having purulent drainage from it overnight.  No fevers or chills.  Call the vascular surgeon on the phone who suggested he come to the ED.  The history is provided by the patient and the spouse.  Illness Severity:  Moderate Onset quality:  Gradual Duration:  3 days Timing:  Constant Progression:  Worsening Chronicity:  Recurrent Associated symptoms: no abdominal pain, no chest pain, no congestion, no diarrhea, no fever, no headaches, no myalgias, no rash, no shortness of breath and no vomiting        Past Medical History:  Diagnosis Date  . Anemia    a. mild, noted 04/2017.  [name] CAD in native artery    a. [name] 04/2017 s/p DES to D1, DES to prox-mid LAD, DES to prox LAD overlapping the prior stent, LVEF 55-65%.   . Chronic diastolic CHF (congestive heart failure) ([place])   . Constipation   . COPD (chronic obstructive pulmonary disease) ([place])   . Diabetic ulcer of toe ([place])   . DJD (degenerative joint disease) of cervical spine   . Dysrhythmia    AFib  . Essential hypertension   . GERD (gastroesophageal reflux disease)   . History of hiatal hernia   . HIT (heparin-induced thrombocytopenia) ([place])   . Hypothyroidism   . Hypoxia    a. went home on home O2 04/2017.  [name] Insomnia   . Mixed hyperlipidemia   . PAD (peripheral artery disease) ([place])   . PAF (paroxysmal atrial fibrillation) ([place])   . PVD (peripheral vascular disease) ([place])   . Renal insufficiency   . 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  . Retinitis   . Sinus bradycardia    a.  HR 30s-40s in 04/2017 -> diltiazem stopped, metoprolol reduced.  . Sleep apnea    "chose not to order CPAP at this time" (05/18/2017)  . Type 2 diabetes mellitus ([place])   . Wears glasses   . Wheezing    a. suspected COPD 04/2017. Former tobacco x 40 years.    Patient Active Problem List   Diagnosis Date Noted  . Acute urinary retention 07/27/2019  . Anaphylaxis 07/26/2019  . Gangrene of toe of right foot ([place]) 02/28/2019  . Gastrointestinal hemorrhage 12/10/2018  . Orthostatic hypotension   . Gastroesophageal reflux disease without esophagitis   . Chronic respiratory failure with hypoxia ([place])   . Bradycardia 11/26/2018  . Abdominal pain, epigastric 11/26/2018  . Acute blood loss anemia 11/26/2018  . PAD (peripheral artery disease) ([place]) 12/13/2017  . Non-healing surgical wound 12/13/2017  . Infected surgical wound 12/13/2017  . Paroxysmal atrial flutter ([place])   . Dyspnea 12/02/2017  . Pancytopenia ([place]) 12/02/2017  . Coronary artery disease involving native coronary artery of native heart without angina pectoris 11/20/2017  . Lower abdominal pain   . Nausea without vomiting   . Anemia 11/18/2017  . Volume overload 11/18/2017  . Chest pain 11/17/2017  . Gangrene ([place])   . Urinary retention 09/16/2017  . MRSA bacteremia 09/15/2017  .  Gangrene of bilateral great toes ([place]) 09/13/2017  . COPD (chronic obstructive pulmonary disease) ([place]) 05/19/2017  . Iron deficiency anemia due to chronic blood loss   . CAD S/P percutaneous coronary angioplasty   . Acute on chronic diastolic CHF (congestive heart failure) ([place])   . PSVT (paroxysmal supraventricular tachycardia) ([place])   . PAF (paroxysmal atrial fibrillation) ([place])   . Sleep apnea   . Type 2 diabetes mellitus ([place])   . Status post coronary artery stent placement   . Essential hypertension 04/15/2010  . Diabetes ([place]) 04/14/2010    Past Surgical History:  Procedure Laterality Date  . ABDOMINAL AORTOGRAM W/LOWER EXTREMITY N/A  09/15/2017   Procedure: ABDOMINAL AORTOGRAM W/LOWER EXTREMITY;  Surgeon: Serafina Mitchell, MD;  Location: [facility];  Service: Cardiovascular;  Laterality: N/A;  . AMPUTATION Bilateral 09/20/2017   Procedure: BILATERAL GREAT TOE AMPUTATIONS INCLUDING METATARSAL HEADS;  Surgeon: Serafina Mitchell, MD;  [facility];  Service: Vascular;  Laterality: Bilateral;  . AMPUTATION Right 03/01/2019   Procedure: AMPUTATION TOES;  Surgeon: Angelia Mould, MD;  Location: [facility];  Service: Vascular;  Laterality: Right;  . AMPUTATION Right 05/29/2019   Procedure: AMPUTATION RIGHT THIRD TOE;  Surgeon: Serafina Mitchell, MD;  Location: [facility] OR;  Service: Vascular;  Laterality: Right;  . AMPUTATION Right 09/26/2019   Procedure: AMPUTATION RIGHT FOURTH TOE AND FIFTH TOES;  Surgeon: Serafina Mitchell, MD;  Location: [facility];  Service: Vascular;  Laterality: Right;  . ANGIOPLASTY Right 09/20/2017   Procedure: ANGIOPLASTY RIGHT TIBIAL ARTERY;  Surgeon: Serafina Mitchell, MD;  Location: [facility];  Service: Vascular;  Laterality: Right;  . APPENDECTOMY    . BACK SURGERY    . CARDIAC CATHETERIZATION  1980s; 2012;  . CATARACT EXTRACTION Left 2004  . COLONOSCOPY  2004   FLEISHMAN TICS  . COLONOSCOPY WITH PROPOFOL N/A 11/28/2018   Procedure: COLONOSCOPY WITH PROPOFOL;  Surgeon: Rogene Houston, MD;  [facility];  Service: Endoscopy;  Laterality: N/A;  . CORONARY ANGIOPLASTY WITH STENT PLACEMENT  05/18/2017   "3 stents"  . CORONARY STENT INTERVENTION N/A 05/18/2017   Procedure: CORONARY STENT INTERVENTION;  Surgeon: Jettie Booze, MD;  Location: [facility];  Service: Cardiovascular;  Laterality: N/A;  . ESOPHAGOGASTRODUODENOSCOPY (EGD) WITH PROPOFOL N/A 11/20/2017   Procedure: ESOPHAGOGASTRODUODENOSCOPY (EGD) WITH PROPOFOL;  Surgeon: Irene Shipper, MD;  Location: [facility];  Service: Gastroenterology;  Laterality: N/A;  . ESOPHAGOGASTRODUODENOSCOPY (EGD) WITH PROPOFOL N/A 11/28/2018    Procedure: ESOPHAGOGASTRODUODENOSCOPY (EGD) WITH PROPOFOL;  Surgeon: Rogene Houston, MD;  [facility];  Service: Endoscopy;  Laterality: N/A;  . ESOPHAGOGASTRODUODENOSCOPY (EGD) WITH PROPOFOL N/A 07/12/2019   Procedure: ESOPHAGOGASTRODUODENOSCOPY (EGD) WITH PROPOFOL;  Surgeon: Rogene Houston, MD;  [facility];  Service: Endoscopy;  Laterality: N/A;  125  . GIVENS CAPSULE STUDY N/A 12/17/2018   Procedure: GIVENS CAPSULE STUDY;  Surgeon: Rogene Houston, MD;  [facility];  Service: Endoscopy;  Laterality: N/A;  7:30am  . I & D EXTREMITY Right 12/17/2017   Procedure: IRRIGATION AND DEBRIDEMENT RIGHT GREAT TOE;  Surgeon: Serafina Mitchell, MD;  [facility];  Service: Vascular;  Laterality: Right;  . JOINT REPLACEMENT    . LEFT HEART CATH AND CORONARY ANGIOGRAPHY N/A 05/18/2017   Procedure: LEFT HEART CATH AND CORONARY ANGIOGRAPHY;  Surgeon: Jettie Booze, MD;  Location: [facility];  Service: Cardiovascular;  Laterality: N/A;  . LOWER EXTREMITY  ANGIOGRAM Right 09/20/2017   Procedure: RIGHT LOWER LEG  ANGIOGRAM;  Surgeon: Serafina Mitchell, MD;  Location: [facility] OR;  Service: Vascular;  Laterality: Right;  . LUMBAR FUSION  2002   L3, 4 L4, 5 L5 S1 Fused by Dr. Glenna Fellows  . PERIPHERAL VASCULAR BALLOON ANGIOPLASTY Left 09/15/2017   Procedure: PERIPHERAL VASCULAR BALLOON ANGIOPLASTY;  Surgeon: Serafina Mitchell, MD;  Location: [facility];  Service: Cardiovascular;  Laterality: Left;  PTA of Peroneal & Posterior Tibial  . POLYPECTOMY  11/28/2018   Procedure: POLYPECTOMY;  Surgeon: Rogene Houston, MD;  [facility];  Service: Endoscopy;;  duodenum  . POSTERIOR LUMBAR FUSION    . RHINOPLASTY    . RIGHT HEART CATH N/A 05/18/2017   Procedure: RIGHT HEART CATH;  Surgeon: Jettie Booze, MD;  Location: [facility];  Service: Cardiovascular;  Laterality: N/A;  . TEE WITHOUT CARDIOVERSION N/A 09/18/2017   Procedure: TRANSESOPHAGEAL  ECHOCARDIOGRAM (TEE);  Surgeon: Fay Records, MD;  Location: [facility];  Service: Cardiovascular;  Laterality: N/A;  . TOTAL KNEE ARTHROPLASTY Bilateral   . TRANSURETHRAL RESECTION OF PROSTATE  2001   [place]       Family History  Problem Relation Age of Onset  . Heart attack Father 68  . COPD Father   . COPD Mother   . Heart disease Mother   . Diabetes Mother   . Hypertension Sister   . CVA Sister   . Diabetes Sister   . Multiple sclerosis Sister     Social History   Tobacco Use  . Smoking status: Former Smoker    Packs/day: 2.00    Years: 30.00    Pack years: 60.00    Types: Cigarettes    Start date: 03/28/1953    Quit date: 03/29/1983    Years since quitting: 36.8  . Smokeless tobacco: Never Used  Vaping Use  . Vaping Use: Never used  Substance Use Topics  . Alcohol use: Not Currently    Comment: 05/18/2017 'I drink a beer q yr"  . Drug use: No    Home Medications Prior to Admission medications   Medication Sig Start Date End Date Taking? Authorizing Provider  acetaminophen (TYLENOL) 500 MG tablet Take 1 tablet (500 mg total) by mouth every 6 (six) hours as needed for mild pain or headache. 03/03/19   Allie Bossier, MD  atorvastatin (LIPITOR) 80 MG tablet Take 80 mg by mouth every evening.  05/23/19   [provider]  Cholecalciferol (VITAMIN D) 50 MCG (2000 UT) tablet Take 2,000 Units by mouth daily.    [provider]  Cyanocobalamin (B-12) 2500 MCG TABS Take 2,500 mcg by mouth daily.    [provider]  DROPLET INSULIN SYRINGE 31G X 5/16" 1 ML [place]  09/24/19   [provider]  ELIQUIS 5 MG TABS tablet Take 5 mg by mouth 2 (two) times daily. 09/24/19   [provider]  folic acid (FOLVITE) 1 MG tablet Take 1 tablet (1 mg total) by mouth daily. 12/06/17   Oswald Hillock, MD  furosemide (LASIX) 80 MG tablet Take 1 tablet by mouth in the morning and at bedtime. 11/19/19   [provider]  insulin glargine (LANTUS)  100 UNIT/ML injection Inject 30-40 Units into the skin See admin instructions. Inject 30 units SQ in the morning and inject 40 units SQ at bedtime    [provider]  isosorbide mononitrate (IMDUR) 30 MG 24 hr  tablet Take 1 tablet (30 mg total) by mouth in the morning and at bedtime. 01/21/20 04/20/20  Imogene Burn, PA-C  lisinopril (ZESTRIL) 5 MG tablet Take 0.5 tablets (2.5 mg total) by mouth daily. 11/29/18   Barton Dubois, MD  meclizine (ANTIVERT) 25 MG tablet Take 25 mg by mouth daily as needed for dizziness.    [provider]  metFORMIN (GLUCOPHAGE) 1000 MG tablet Take 1,000 mg by mouth 2 (two) times daily. 05/22/17   [provider]  nitroGLYCERIN (NITROSTAT) 0.4 MG SL tablet PLACE 1 TABLET (0.4 MG TOTAL) UNDER THE TONGUE EVERY 5  MINUTES AS NEEDED FOR CHEST PAIN. Patient taking differently: Place 0.4 mg under the tongue every 5 (five) minutes as needed for chest pain.  05/22/17   Arnoldo Lenis, MD  pantoprazole (PROTONIX) 40 MG tablet Take 1 tablet (40 mg total) by mouth 2 (two) times daily. To protect stomach while taking multiple blood thinners. 11/29/18   Barton Dubois, MD  potassium chloride SA (K-DUR) 20 MEQ tablet Take 1 tablet (20 mEq total) by mouth daily. 11/30/18   Barton Dubois, MD  psyllium (METAMUCIL) 58.6 % packet Take 0.5 packets by mouth 2 (two) times daily.     [provider]  traMADol (ULTRAM) 50 MG tablet Take 1 tablet (50 mg total) by mouth every 8 (eight) hours as needed. 09/26/19   Rhyne, Hulen Shouts, PA-C  traZODone (DESYREL) 100 MG tablet Take 100 mg by mouth at bedtime.  03/25/16   [provider]  TRUE METRIX BLOOD GLUCOSE TEST test strip  07/12/19   [provider]    Allergies    Bactrim [sulfamethoxazole-trimethoprim], Codeine, Doxycycline, Heparin, Losartan, Oxycodone, and Feraheme [ferumoxytol]  Review of Systems   Review of Systems  Constitutional: Negative for chills and fever.  HENT: Negative for  congestion and facial swelling.   Eyes: Negative for discharge and visual disturbance.  Respiratory: Negative for shortness of breath.   Cardiovascular: Negative for chest pain and palpitations.  Gastrointestinal: Negative for abdominal pain, diarrhea and vomiting.  Musculoskeletal: Negative for arthralgias and myalgias.  Skin: Positive for color change and wound. Negative for rash.  Neurological: Negative for tremors, syncope and headaches.  Psychiatric/Behavioral: Negative for confusion and dysphoric mood.    Physical Exam Updated Vital Signs BP 104/85   Pulse 84   Temp 97.9 F (36.6 C) (Oral)   Resp 18   Ht 5\' 7"  (1.702 m)   Wt 82.6 kg   SpO2 98%   BMI 28.51 kg/m   Physical Exam Vitals and nursing note reviewed.  Constitutional:      Appearance: He is well-developed.  HENT:     Head: Normocephalic and atraumatic.  Eyes:     Pupils: Pupils are equal, round, and reactive to light.  Neck:     Vascular: No JVD.  Cardiovascular:     Rate and Rhythm: Normal rate and regular rhythm.     Heart sounds: No murmur heard.  No friction rub. No gallop.   Pulmonary:     Effort: No respiratory distress.     Breath sounds: No wheezing.  Abdominal:     General: There is no distension.     Tenderness: There is no guarding or rebound.  Musculoskeletal:        General: Normal range of motion.     Cervical back: Normal range of motion and neck supple.  Skin:    Coloration: Skin is not pale.     Findings: No  rash.  Neurological:     Mental Status: He is alert and oriented to person, place, and time.  Psychiatric:        Behavior: Behavior normal.            ED Results / Procedures / Treatments   Labs (all labs ordered are listed, but only abnormal results are displayed) Labs Reviewed  CBC WITH DIFFERENTIAL/PLATELET - Abnormal; Notable for the following components:      Result Value   RBC 3.98 (*)    Hemoglobin 8.8 (*)    HCT 29.4 (*)    MCV 73.9 (*)    MCH 22.1  (*)    MCHC 29.9 (*)    RDW 24.1 (*)    Monocytes Absolute 2.2 (*)    Abs Immature Granulocytes 0.16 (*)    All other components within normal limits  BASIC METABOLIC PANEL - Abnormal; Notable for the following components:   Sodium 134 (*)    Chloride 96 (*)    Glucose, Bld 197 (*)    Creatinine, Ser 1.40 (*)    Calcium 8.7 (*)    GFR, Estimated 50 (*)    All other components within normal limits  RESPIRATORY PANEL BY RT PCR (FLU A&B, COVID)    EKG None  Radiology DG Foot Complete Right  Result Date: 02/02/2020 CLINICAL DATA:  Wound complaint of right foot infection EXAM: RIGHT FOOT COMPLETE - 3+ VIEW COMPARISON:  February 28, 2019 FINDINGS: There is been interval increased amputations of the second through fifth digits. Again noted is a trans metatarsal first digit amputation. There appears to be slight cortical irregularity seen at the distal aspect of the fifth metatarsal head and remaining portion of the third proximal phalanx. Overlying soft tissue swelling with subcutaneous emphysema is seen. IMPRESSION: Possible cortical irregularity at the fifth metatarsal head and remaining portion of the third proximal phalanx with overlying soft tissue swelling and subcutaneous emphysema. This could be from osteomyelitis. Electronically Signed   By: Prudencio Pair M.D.   On: 02/02/2020 18:28    Procedures Procedures (including critical care time)  Medications Ordered in ED Medications  ciprofloxacin (CIPRO) IVPB 400 mg (400 mg Intravenous New Bag/Given 02/02/20 1926)    ED Course  I have reviewed the triage vital signs and the nursing notes.  Pertinent labs & imaging results that were available during my care of the patient were reviewed by me and considered in my medical decision making (see chart for details).    MDM Rules/Calculators/A&P                          86 yo M with a wound to his right foot.  Having purulent drainage for about 24 to 48 hours.  No fevers.  X-ray with  concerns for osteomyelitis.  I discussed case with [name], vascular surgery.  Recommend admission IV antibiotics and vascular would follow along.  The patients results and plan were reviewed and discussed.   Any x-rays performed were independently reviewed by myself.   Differential diagnosis were considered with the presenting HPI.  Medications  ciprofloxacin (CIPRO) IVPB 400 mg (400 mg Intravenous New Bag/Given 02/02/20 1926)    Vitals:   02/02/20 1735 02/02/20 1800 02/02/20 1815 02/02/20 1830  BP: 123/89 132/66 128/65 104/85  Pulse: 83 80 78 84  Resp: 18     Temp:      TempSrc:      SpO2:  99% 99% 98%  Weight:      Height:        Final diagnoses:  Cellulitis of right foot    Admission/ observation were discussed with the admitting physician, patient and/or family and they are comfortable with the plan.    Final Clinical Impression(s) / ED Diagnoses Final diagnoses:  Cellulitis of right foot    Rx / DC Orders ED Discharge Orders    None       Deno Etienne, DO 02/02/20 1938

## 2020-02-02 NOTE — H&P (Addendum)
[facility] Admission History and Physical Service Pager: [phone]  Patient name: Mike Wright Medical record number: [id number] Date of birth: 04-16-1933 Age: 86 y.o. Gender: male  Primary Care Provider: Monico Blitz, MD Consultants: Vascular surgery Code Status: Partial code -medicine no CPR and intubation, chest compressions limited to 30 minutes Preferred Emergency Contact: [phone] wife Mrs. Elige Ko  Chief Complaint: right foot wound   Assessment and Plan: Mike Wright is a 86 y.o. male presenting with infected right foot wound. 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  Right foot Infection, concerning for osteomyelitis Patient presenting with a new foot wound on his right distal foot. Presented afebrile with stable vitals, reports noting erythema and purulent drainage from affected site along the dorsum portion for the past week. 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 In the ED, given dose of ciprofloxacin due to concern for cellulitis. COVID and influenza negative. XR notable possible cortical irregularity at the fifth metatarsal head and remaining portion of the third proximal phalanx with the overlying soft tissue swelling and subcutaneous emphysema, possibly showing signs of osteomyelitis. Given lack of gross symptoms including afebrile and WNC 9.5 wnl, less concern for systemic infection. Consider CXR if symptoms worsen and higher concern for systemic infection. Possible cellulitis given area of erythema and warmth to touch. Another consideration possibly due to complications of diabetes but less likely given that patient's diabetes decently controlled.  Localized infection likely due to osteomyelitis. Pending blood and urine cultures.  -Admit to FMTS, attending Dr. Ardelia Mems -vascular surgery consulted, appreciate involvement and recommendations  -IV ceftriaxone 2 g, IV vancomycin per pharmacy -pending MRSA PCR, f/u blood cultures -NPO at midnight pending any procedures -f/u vascular arterial US, ABI -monitor vitals  -up with assistance -PT/OT  Hypertension Blood pressure on admission 135/81.  Patient is prescribed Imdur 30 mg twice daily and lisinopril 2.5 mg daily.  Patient also prescribed Lasix 80 mg twice daily for history of "fluid overload".  -continue home lisinopril  -continue Lasix once daily -monitor BP  Hyponatremia Admitted with mild hyponatremia Na 134. Likely secondary to hyperglycemia. When corrected, 136. -continue to monitor, am CMP   Type 2 diabetes Most recent HbA1c 6.7% (April 2021) and blood glucose 197.  Patient prescribed Metformin 1000 mg twice daily and Lantus 30 units in the morning and 40 units at night.  Patient also prescribed lisinopril 2.5 mg daily. Also with peripheral neuropathy and blindness of left eye. -sSSI -Lantus 15 units bid  -monitor CBGs -pending A1c  -f/u ophthalmologist outpatient   CAD  PAD  Patient stentx3 placement in 2019. Most recent echo notable for EF 65-70% with normal function of LV without regional wall motion abnormalities. Moderate LV hypertrophy and moderate calcification of aortic valve noted. No ST elevations noted on EKG. Patient with 60-pack-year smoking history, quit smoking in the 80s. Patient taking Lasix 80 mg daily and Lipitor 80 mg daily. -continue home lasix daily   Microcytic anemia  Chronic. On admission, Hgb 8.8 and MCV 73.9, likely secondary to anemia of chronic disease. Patient's history suggests anemia could be secondary to chronic blood loss anemia, possibly gastric source. He was told he has a bleed somewhere but they cant find it. Both endoscopy and  colonoscopy performed in 2020. EGD demonstrated removal of  2 duodenal polyps. Colonoscopy notable for multiple diverticula in the sigmoid and decision colon. No active source of bleeding noted. Has to get blood transfusions about every 2 months coordinated by his PCP in [place]. Takes folic acid at home, currently not on iron supplement at home.  -Monitor Hgb with daily CBCs, transfusion threshold <8 -Draw labs with pediatric tubes to minimize blood loss -f/u with PCP, consider starting iron supplementation outpatient   CKD 3a Chronic and at baseline. GFR on admission 50, Cr 1.4 on admission appears to be around patient's baseline. -Avoid nephrotoxic meds -am CMP  History OSA (2018) As patient's oxygenation was well maintained during his sleep study he preferred not to go on CPAP. However he is on 2L [place] at night at home, other than at night patient does not require oxygen during the day at baseline. -continue 2L O2 at night as needed  HLD Last lipid panel December 2020 normal except for HDL low at 16. ASCVD risk 45.8%. Patient taking Atorvastatin 80mg  daily.  -Continue Atorvastatin 80mg  -Hold Eliquis 5mg  due to pending procedure  COPD Per patient, has had a 5-6 year history of diagnosis noted in patient's chart. However, patient's recent spirometry was normal in March 2019. Home meds include duoneb.  -continue duoneb -monitor respiratory status -consider outpatient PFTs   Chronic dizziness  Orthostatic hypotension Endorses occasional dizziness with onset at random times throughout the day, not necessarily associated with activity or positional changes. Taking meclizine 25 mg daily at home. History of orthostatic hypotension, no home meds.  BP on admission 135/81.  -hold home meclizine  -consider orthostatics if needed  -continue to monitor   Paroxysmal Atrial fibrillation HR on admission 89. EKG on admission notable for atrial fibrillation. Home meds include eliquis 5 mg bid. 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  -hold home eliquis -argatroban   GERD Home med includes protonix 40 mg bid. -continue home med   FEN/GI: NPO at midnight  Prophylaxis: Argatroban (pt with hx of HIT)  Disposition: Admit to medical telemetry  History of Present Illness:  Mike Wright is a 86 y.o. male presenting with a 1 week history of new wound on his right dorsum foot. Patient reports that over the last few days the foot wound has started developing drainage and this drainage has gotten worse. Endorses both purulent drainage as well as blood. States that he placed a dressing and it was saturated in drainage. Noticed erythema surrounding wound. Patient denies any fevers or other systemic symptoms. Pain has gone from 10/10 down to 5/10 currently.  Has not taken anything to help his symptoms. Denies chest pain but admits to dyspnea that he has at baseline but has not worsened. States that he has occasional nausea and dizziness that occurs at random, even at rest. Denies recent fever, chills, body aches, illness and sick contacts. Former smoker who quit 30 years ago. Denies alcohol and illicit drug use. Lives at home with wife. Uses walker, cane and wheelchair to assist with ambulation. Endorses having a chronic history of anemia that require him to receive blood transfusions every 2 months, these are usually arranged by his PCP.     Review Of Systems: Per HPI with the following additions: no    Patient Active Problem List   Diagnosis Date Noted  . Open wound of foot, right, initial encounter 02/02/2020  . Open wound of foot with complication, initial encounter 02/02/2020  .  Acute urinary retention 07/27/2019  . Anaphylaxis 07/26/2019  . Gangrene of toe of right foot ([place]) 02/28/2019  . Gastrointestinal hemorrhage 12/10/2018  . Orthostatic hypotension   . Gastroesophageal reflux disease without esophagitis   . Chronic respiratory failure with hypoxia ([place])   .  Bradycardia 11/26/2018  . Abdominal pain, epigastric 11/26/2018  . Acute blood loss anemia 11/26/2018  . PAD (peripheral artery disease) ([place]) 12/13/2017  . Non-healing surgical wound 12/13/2017  . Infected surgical wound 12/13/2017  . Paroxysmal atrial flutter ([place])   . Dyspnea 12/02/2017  . Pancytopenia ([place]) 12/02/2017  . Coronary artery disease involving native coronary artery of native heart without angina pectoris 11/20/2017  . Lower abdominal pain   . Nausea without vomiting   . Anemia 11/18/2017  . Volume overload 11/18/2017  . Chest pain 11/17/2017  . Gangrene ([place])   . Urinary retention 09/16/2017  . MRSA bacteremia 09/15/2017  . Gangrene of bilateral great toes ([place]) 09/13/2017  . COPD (chronic obstructive pulmonary disease) ([place]) 05/19/2017  . Iron deficiency anemia due to chronic blood loss   . CAD S/P percutaneous coronary angioplasty   . Acute on chronic diastolic CHF (congestive heart failure) ([place])   . PSVT (paroxysmal supraventricular tachycardia) ([place])   . PAF (paroxysmal atrial fibrillation) ([place])   . Sleep apnea   . Type 2 diabetes mellitus ([place])   . Status post coronary artery stent placement   . Essential hypertension 04/15/2010  . Diabetes ([place]) 04/14/2010    Past Medical History: Past Medical History:  Diagnosis Date  . Anemia    a. mild, noted 04/2017.  [name] CAD in native artery    a. [name] 04/2017 s/p DES to D1, DES to prox-mid LAD, DES to prox LAD overlapping the prior stent, LVEF 55-65%.   . Chronic diastolic CHF (congestive heart failure) ([place])   . Constipation   . COPD (chronic obstructive pulmonary disease) ([place])   . Diabetic ulcer of toe ([place])   . DJD (degenerative joint disease) of cervical spine   . Dysrhythmia    AFib  . Essential hypertension   . GERD (gastroesophageal reflux disease)   . History of hiatal hernia   . HIT (heparin-induced thrombocytopenia) ([place])   . Hypothyroidism   . Hypoxia    a. went home on home O2 04/2017.  [name]  Insomnia   . Mixed hyperlipidemia   . PAD (peripheral artery disease) ([place])   . PAF (paroxysmal atrial fibrillation) ([place])   . PVD (peripheral vascular disease) ([place])   . Renal insufficiency   . 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  . Retinitis   . Sinus bradycardia    a. HR 30s-40s in 04/2017 -> diltiazem stopped, metoprolol reduced.  . Sleep apnea    "chose not to order CPAP at this time" (05/18/2017)  . Type 2 diabetes mellitus ([place])   . Wears glasses   . Wheezing    a. suspected COPD 04/2017. Former tobacco x 40 years.    Past Surgical History: Past Surgical History:  Procedure Laterality Date  . ABDOMINAL AORTOGRAM W/LOWER EXTREMITY N/A 09/15/2017   Procedure: ABDOMINAL AORTOGRAM W/LOWER EXTREMITY;  Surgeon: Serafina Mitchell, MD;  Location: [facility];  Service: Cardiovascular;  Laterality: N/A;  . AMPUTATION Bilateral 09/20/2017   Procedure: BILATERAL GREAT TOE AMPUTATIONS INCLUDING METATARSAL HEADS;  Surgeon: Serafina Mitchell, MD;  Location: [facility];  Service: Vascular;  Laterality: Bilateral;  . AMPUTATION Right 03/01/2019   Procedure: AMPUTATION TOES;  Surgeon: Angelia Mould, MD;  [facility];  Service: Vascular;  Laterality: Right;  . AMPUTATION Right 05/29/2019   Procedure: AMPUTATION RIGHT THIRD TOE;  Surgeon: Serafina Mitchell, MD;  Location: [facility] OR;  Service: Vascular;  Laterality: Right;  . AMPUTATION Right 09/26/2019   Procedure: AMPUTATION RIGHT FOURTH TOE AND FIFTH TOES;  Surgeon: Serafina Mitchell, MD;  Location: [facility] OR;  Service: Vascular;  Laterality: Right;  . ANGIOPLASTY Right 09/20/2017   Procedure: ANGIOPLASTY RIGHT TIBIAL ARTERY;  Surgeon: Serafina Mitchell, MD;  Location: [facility];  Service: Vascular;  Laterality: Right;  . APPENDECTOMY    . BACK SURGERY    . CARDIAC CATHETERIZATION  1980s; 2012;  . CATARACT EXTRACTION Left 2004  . COLONOSCOPY  2004   FLEISHMAN TICS  . COLONOSCOPY WITH PROPOFOL N/A 11/28/2018   Procedure: COLONOSCOPY WITH  PROPOFOL;  Surgeon: Rogene Houston, MD;  [facility];  Service: Endoscopy;  Laterality: N/A;  . CORONARY ANGIOPLASTY WITH STENT PLACEMENT  05/18/2017   "3 stents"  . CORONARY STENT INTERVENTION N/A 05/18/2017   Procedure: CORONARY STENT INTERVENTION;  Surgeon: Jettie Booze, MD;  Location: [place] [facility];  Service: Cardiovascular;  Laterality: N/A;  . ESOPHAGOGASTRODUODENOSCOPY (EGD) WITH PROPOFOL N/A 11/20/2017   Procedure: ESOPHAGOGASTRODUODENOSCOPY (EGD) WITH PROPOFOL;  Surgeon: Irene Shipper, MD;  Location: [facility];  Service: Gastroenterology;  Laterality: N/A;  . ESOPHAGOGASTRODUODENOSCOPY (EGD) WITH PROPOFOL N/A 11/28/2018   Procedure: ESOPHAGOGASTRODUODENOSCOPY (EGD) WITH PROPOFOL;  Surgeon: Rogene Houston, MD;  [facility];  Service: Endoscopy;  Laterality: N/A;  . ESOPHAGOGASTRODUODENOSCOPY (EGD) WITH PROPOFOL N/A 07/12/2019   Procedure: ESOPHAGOGASTRODUODENOSCOPY (EGD) WITH PROPOFOL;  Surgeon: Rogene Houston, MD;  [facility];  Service: Endoscopy;  Laterality: N/A;  125  . GIVENS CAPSULE STUDY N/A 12/17/2018   Procedure: GIVENS CAPSULE STUDY;  Surgeon: Rogene Houston, MD;  [facility];  Service: Endoscopy;  Laterality: N/A;  7:30am  . I & D EXTREMITY Right 12/17/2017   Procedure: IRRIGATION AND DEBRIDEMENT RIGHT GREAT TOE;  Surgeon: Serafina Mitchell, MD;  [facility];  Service: Vascular;  Laterality: Right;  . JOINT REPLACEMENT    . LEFT HEART CATH AND CORONARY ANGIOGRAPHY N/A 05/18/2017   Procedure: LEFT HEART CATH AND CORONARY ANGIOGRAPHY;  Surgeon: Jettie Booze, MD;  Location: [facility];  Service: Cardiovascular;  Laterality: N/A;  . LOWER EXTREMITY ANGIOGRAM Right 09/20/2017   Procedure: RIGHT LOWER LEG  ANGIOGRAM;  Surgeon: Serafina Mitchell, MD;  Location: [facility];  Service: Vascular;  Laterality: Right;  . LUMBAR FUSION  2002   L3, 4 L4, 5 L5 S1 Fused by Dr. Glenna Fellows  . PERIPHERAL VASCULAR BALLOON  ANGIOPLASTY Left 09/15/2017   Procedure: PERIPHERAL VASCULAR BALLOON ANGIOPLASTY;  Surgeon: Serafina Mitchell, MD;  Location: [facility];  Service: Cardiovascular;  Laterality: Left;  PTA of Peroneal & Posterior Tibial  . POLYPECTOMY  11/28/2018   Procedure: POLYPECTOMY;  Surgeon: Rogene Houston, MD;  [facility];  Service: Endoscopy;;  duodenum  . POSTERIOR LUMBAR FUSION    . RHINOPLASTY    . RIGHT HEART CATH N/A 05/18/2017   Procedure: RIGHT HEART CATH;  Surgeon: Jettie Booze, MD;  Location: [facility];  Service: Cardiovascular;  Laterality: N/A;  . TEE WITHOUT CARDIOVERSION N/A 09/18/2017   Procedure: TRANSESOPHAGEAL ECHOCARDIOGRAM (TEE);  Surgeon: Fay Records, MD;  Location: [place] [place];  Service: Cardiovascular;  Laterality: N/A;  . TOTAL KNEE ARTHROPLASTY Bilateral   . TRANSURETHRAL RESECTION OF PROSTATE  2001   [place]    Social History: Social History   Tobacco Use  . Smoking status: Former Smoker    Packs/day: 2.00    Years: 30.00    Pack years: 60.00    Types: Cigarettes    Start date: 03/28/1953    Quit date: 03/29/1983    Years since quitting: 36.8  . Smokeless tobacco: Never Used  Vaping Use  . Vaping Use: Never used  Substance Use Topics  . Alcohol use: Not Currently    Comment: 05/18/2017 'I drink a beer q yr"  . Drug use: No   Additional social history:  Please also refer to relevant sections of EMR.  Family History: Family History  Problem Relation Age of Onset  . Heart attack Father 74  . COPD Father   . COPD Mother   . Heart disease Mother   . Diabetes Mother   . Hypertension Sister   . CVA Sister   . Diabetes Sister   . Multiple sclerosis Sister     Allergies and Medications: Allergies  Allergen Reactions  . Bactrim [Sulfamethoxazole-Trimethoprim] Other (See Comments)    Pancytopenia  . Codeine Shortness Of Breath  . Doxycycline Swelling  . Feraheme [Ferumoxytol] Other (See Comments)    Diaphoretic, chest  pain  . Heparin Other (See Comments)    +HIT,  Severe bleeding (with heparin drip & large doses), tolerates low doses  . Losartan Swelling  . Oxycodone Other (See Comments)    "Made me act out of my mind" Mental status changes- hallucinations  . Latex Rash   No current facility-administered medications on file prior to encounter.   Current Outpatient Medications on File Prior to Encounter  Medication Sig Dispense Refill  . acetaminophen (TYLENOL) 500 MG tablet Take 1 tablet (500 mg total) by mouth every 6 (six) hours as needed for mild pain or headache. 30 tablet 0  . atorvastatin (LIPITOR) 80 MG tablet Take 80 mg by mouth every evening.     . Cholecalciferol (VITAMIN D3) 50 MCG (2000 UT) TABS Take 2,000 Units by mouth daily.    . Cyanocobalamin (B-12) 2500 MCG TABS Take 2,500 mcg by mouth daily.    [name] ELIQUIS 5 MG TABS tablet Take 5 mg by mouth 2 (two) times daily.    . folic acid (FOLVITE) 1 MG tablet Take 1 tablet (1 mg total) by mouth daily. 30 tablet 1  . furosemide (LASIX) 80 MG tablet Take 80 mg by mouth See admin instructions. Take 80 mg by mouth in the morning and between 5-6 PM daily    . insulin glargine (LANTUS) 100 UNIT/ML injection Inject 30-40 Units into the skin See admin instructions. Inject 30 units into the skin in the morning and 40 units at bedtime    . ipratropium-albuterol (DUONEB) 0.5-2.5 (3) MG/3ML SOLN Take 3 mLs by nebulization every evening.     . isosorbide mononitrate (IMDUR) 30 MG 24 hr tablet Take 1 tablet (30 mg total) by mouth in the morning and at bedtime. 180 tablet 3  . lisinopril (ZESTRIL) 5 MG tablet Take 0.5 tablets (2.5 mg total) by mouth daily.    . meclizine (ANTIVERT) 25 MG tablet Take 25 mg by mouth daily as needed for dizziness.    . metFORMIN (GLUCOPHAGE) 1000 MG tablet Take 1,000 mg by mouth 2 (two) times daily.    . nitroGLYCERIN (NITROSTAT) 0.4 MG SL tablet  PLACE 1 TABLET (0.4 MG TOTAL) UNDER THE TONGUE EVERY 5  MINUTES AS NEEDED FOR CHEST  PAIN. (Patient taking differently: Place 0.4 mg under the tongue every 5 (five) minutes as needed for chest pain. ) 25 tablet 3  . OXYGEN Inhale 2 L/min into the lungs See admin instructions. 2 L/min of oxygen at bedtime and during the day as needed for shortness of breath    . pantoprazole (PROTONIX) 40 MG tablet Take 1 tablet (40 mg total) by mouth 2 (two) times daily. To protect stomach while taking multiple blood thinners. 60 tablet 2  . potassium chloride SA (K-DUR) 20 MEQ tablet Take 1 tablet (20 mEq total) by mouth daily. 30 tablet 1  . psyllium (METAMUCIL) 58.6 % packet Take 0.5 packets by mouth 2 (two) times daily. [place]    . SPIRIVA HANDIHALER 18 MCG inhalation capsule Place 1 capsule into inhaler and inhale daily after breakfast.     . traMADol (ULTRAM) 50 MG tablet Take 1 tablet (50 mg total) by mouth every 8 (eight) hours as needed. (Patient taking differently: Take 50 mg by mouth every 8 (eight) hours as needed (for pain). ) 20 tablet 0  . traZODone (DESYREL) 100 MG tablet Take 100-200 mg by mouth at bedtime.     . DROPLET INSULIN SYRINGE 31G X 5/16" 1 ML MISC     . TRUE METRIX BLOOD GLUCOSE TEST test strip       Objective: BP (!) 176/90   Pulse 81   Temp 97.9 F (36.6 C) (Oral)   Resp 18   Ht 5\' 7"  (1.702 m)   Wt 82.6 kg   SpO2 97%   BMI 28.51 kg/m  Exam: General: Patient laying comfortably in bed, in no acute distress.  HEENT: normocephalic, atraumatic  Neck: supple neck without evidence of lymphadenopathy, non-tender thyroid  Cardiovascular: 2/6 systolic murmur noted along the left sternal border, RRR Respiratory: lungs clear to auscultation bilaterally, no rales or rhonchi noted, no signs of respiratory distress, breathing on room air comfortably  Gastrointestinal: soft, nontender, presence of active bowel sounds Ext: radial and distal pulses strong and equal bilaterally, moderate LE noted bilaterally Derm: Right foot significant for 2-3 cm blackened circular  area with minimal blood drainage but not purulent material noted with crusting noted along the outer edge, presence of surrounding erythema noted to slightly below the ankle. No abnormal findings noted on the left foot.  Neuro: A&Ox4, appropriately conversational  Psych: mood appropriate         Labs and Imaging: CBC BMET  Recent Labs  Lab 02/02/20 1603  WBC 9.5  HGB 8.8*  HCT 29.4*  PLT 251   Recent Labs  Lab 02/02/20 1603  NA 134*  K 3.7  CL 96*  CO2 26  BUN 22  CREATININE 1.40*  GLUCOSE 197*  CALCIUM 8.7*     EKG: no p waves noted, consistent with atrial fibrillation   Donney Dice, DO 02/02/2020, 11:31 PM PGY-1, [place] Intern pager: [phone], text pages welcome  FPTS Upper-Level Resident Addendum  I have independently interviewed and examined the patient. I have discussed the above with the original author and agree with their documentation. My edits for correction/addition/clarification are in italics. Please see also any attending notes.    Milus Banister, DO PGY-3, [facility] 02/03/2020 6:52 AM  FPTS Service pager: [phone] (text pages welcome through [facility])

## 2020-02-02 NOTE — Progress Notes (Signed)
Pharmacy Antibiotic Note  Mike Wright is a 86 y.o. male admitted on 02/02/2020 with foot infection.  Pharmacy has been consulted for vancomycin dosing. Also on ceftriaxone  Plan: Vancomycin 1250 mg IV every 12 hours.  Goal trough 15-20 mcg/mL.  Height: 5\' 7"  (170.2 cm) Weight: 82.6 kg (182 lb) IBW/kg (Calculated) : 66.1  Temp (24hrs), Avg:97.9 F (36.6 C), Min:97.9 F (36.6 C), Max:97.9 F (36.6 C)  Recent Labs  Lab 02/02/20 1603  WBC 9.5  CREATININE 1.40*    Estimated Creatinine Clearance: 40.4 mL/min (A) (by C-G formula based on SCr of 1.4 mg/dL (H)).    Allergies  Allergen Reactions  . Bactrim [Sulfamethoxazole-Trimethoprim] Other (See Comments)    Pancytopenia  . Codeine Shortness Of Breath  . Doxycycline Swelling  . Feraheme [Ferumoxytol] Other (See Comments)    Diaphoretic, chest pain  . Heparin Other (See Comments)    +HIT,  Severe bleeding (with heparin drip & large doses), tolerates low doses  . Losartan Swelling  . Oxycodone Other (See Comments)    "Made me act out of my mind" Mental status changes- hallucinations  . Latex Rash    Antimicrobials this admission: Vanc 11/7 >  Ceftriaxone 11/7 >   Dose adjustments this admission:  Microbiology results: None currently  Thank you for allowing pharmacy to be a part of this patient's care.  [place] Crane, Roylene Reason, BCCP Clinical Pharmacist  02/02/2020 9:53 PM   [facility] pharmacy phone numbers are listed on [email or web address]

## 2020-02-02 NOTE — ED Triage Notes (Signed)
Pt coming from home. Pt complaint of right foot infection. Pt states he has had toes amputated last one being in July. Pt states he has started having drainage and his foot is red and warm. NAD.

## 2020-02-03 ENCOUNTER — Inpatient Hospital Stay (HOSPITAL_COMMUNITY): Payer: Medicare HMO

## 2020-02-03 ENCOUNTER — Telehealth: Payer: Self-pay

## 2020-02-03 DIAGNOSIS — Z01810 Encounter for preprocedural cardiovascular examination: Secondary | ICD-10-CM | POA: Diagnosis not present

## 2020-02-03 DIAGNOSIS — I739 Peripheral vascular disease, unspecified: Secondary | ICD-10-CM

## 2020-02-03 DIAGNOSIS — Z1810 Retained metal fragments, unspecified: Secondary | ICD-10-CM

## 2020-02-03 DIAGNOSIS — E876 Hypokalemia: Secondary | ICD-10-CM

## 2020-02-03 DIAGNOSIS — L03115 Cellulitis of right lower limb: Secondary | ICD-10-CM | POA: Insufficient documentation

## 2020-02-03 LAB — COMPREHENSIVE METABOLIC PANEL
ALT: 13 U/L (ref 0–44)
AST: 19 U/L (ref 15–41)
Albumin: 3.1 g/dL — ABNORMAL LOW (ref 3.5–5.0)
Alkaline Phosphatase: 59 U/L (ref 38–126)
Anion gap: 12 (ref 5–15)
BUN: 15 mg/dL (ref 8–23)
CO2: 25 mmol/L (ref 22–32)
Calcium: 8.4 mg/dL — ABNORMAL LOW (ref 8.9–10.3)
Chloride: 100 mmol/L (ref 98–111)
Creatinine, Ser: 1.23 mg/dL (ref 0.61–1.24)
GFR, Estimated: 58 mL/min — ABNORMAL LOW (ref >60)
Glucose, Bld: 147 mg/dL — ABNORMAL HIGH (ref 70–99)
Potassium: 3.3 mmol/L — ABNORMAL LOW (ref 3.5–5.1)
Sodium: 137 mmol/L (ref 135–145)
Total Bilirubin: 0.6 mg/dL (ref 0.3–1.2)
Total Protein: 6.1 g/dL — ABNORMAL LOW (ref 6.5–8.1)

## 2020-02-03 LAB — CBC
HCT: 27.7 % — ABNORMAL LOW (ref 39.0–52.0)
Hemoglobin: 8.2 g/dL — ABNORMAL LOW (ref 13.0–17.0)
MCH: 22 pg — ABNORMAL LOW (ref 26.0–34.0)
MCHC: 29.6 g/dL — ABNORMAL LOW (ref 30.0–36.0)
MCV: 74.5 fL — ABNORMAL LOW (ref 80.0–100.0)
Platelets: 256 10*3/uL (ref 150–400)
RBC: 3.72 MIL/uL — ABNORMAL LOW (ref 4.22–5.81)
RDW: 24.3 % — ABNORMAL HIGH (ref 11.5–15.5)
WBC: 7 10*3/uL (ref 4.0–10.5)
nRBC: 0 % (ref 0.0–0.2)

## 2020-02-03 LAB — CBG MONITORING, ED
Glucose-Capillary: 144 mg/dL — ABNORMAL HIGH (ref 70–99)
Glucose-Capillary: 199 mg/dL — ABNORMAL HIGH (ref 70–99)
Glucose-Capillary: 188 mg/dL — ABNORMAL HIGH (ref 70–99)

## 2020-02-03 LAB — APTT
aPTT: 93 seconds — ABNORMAL HIGH (ref 24–36)
aPTT: 59 seconds — ABNORMAL HIGH (ref 24–36)
aPTT: 67 seconds — ABNORMAL HIGH (ref 24–36)

## 2020-02-03 LAB — GLUCOSE, CAPILLARY: Glucose-Capillary: 165 mg/dL — ABNORMAL HIGH (ref 70–99)

## 2020-02-03 LAB — HEMOGLOBIN A1C
Hgb A1c MFr Bld: 6.7 % — ABNORMAL HIGH (ref 4.8–5.6)
Mean Plasma Glucose: 145.59 mg/dL

## 2020-02-03 MED ORDER — MUPIROCIN 2 % EX OINT
1.0000 "application " | TOPICAL_OINTMENT | Freq: Two times a day (BID) | CUTANEOUS | Status: DC
  Administered 2020-02-03 – 2020-02-07 (×8): 1 via NASAL
  Filled 2020-02-03 (×4): qty 22

## 2020-02-03 MED ORDER — POTASSIUM CHLORIDE CRYS ER 20 MEQ PO TBCR
40.0000 meq | EXTENDED_RELEASE_TABLET | Freq: Once | ORAL | Status: AC
  Administered 2020-02-03: 40 meq via ORAL
  Filled 2020-02-03: qty 2

## 2020-02-03 MED ORDER — FUROSEMIDE 80 MG PO TABS
80.0000 mg | ORAL_TABLET | Freq: Two times a day (BID) | ORAL | Status: DC
  Administered 2020-02-03 – 2020-02-07 (×9): 80 mg via ORAL
  Filled 2020-02-03 (×4): qty 1
  Filled 2020-02-03: qty 4
  Filled 2020-02-03: qty 1
  Filled 2020-02-03: qty 4
  Filled 2020-02-03 (×2): qty 1

## 2020-02-03 MED ORDER — IPRATROPIUM-ALBUTEROL 0.5-2.5 (3) MG/3ML IN SOLN: 3.0000 mL | Freq: Every morning | RESPIRATORY_TRACT | Status: DC

## 2020-02-03 MED ORDER — IPRATROPIUM-ALBUTEROL 0.5-2.5 (3) MG/3ML IN SOLN
3.0000 mL | Freq: Every day | RESPIRATORY_TRACT | Status: DC
  Administered 2020-02-03 – 2020-02-07 (×5): 3 mL via RESPIRATORY_TRACT
  Filled 2020-02-03 (×5): qty 3

## 2020-02-03 NOTE — Progress Notes (Addendum)
ANTICOAGULATION CONSULT NOTE - Initial Consult  Pharmacy Consult for argatroban Indication: atrial fibrillation, hx HIT  Allergies  Allergen Reactions  . Bactrim [Sulfamethoxazole-Trimethoprim] Other (See Comments)    Pancytopenia  . Codeine Shortness Of Breath  . Doxycycline Swelling  . Feraheme [Ferumoxytol] Other (See Comments)    Diaphoretic, chest pain  . Heparin Other (See Comments)    +HIT,  Severe bleeding (with heparin drip & large doses), tolerates low doses  . Losartan Swelling  . Oxycodone Other (See Comments)    "Made me act out of my mind" Mental status changes- hallucinations  . Latex Rash    Patient Measurements: Height: 5\' 7"  (170.2 cm) Weight: 82.6 kg (182 lb) IBW/kg (Calculated) : 66.1  Vital Signs: Temp: 98.2 F (36.8 C) (11/08 0553) Temp Source: Oral (11/08 0553) BP: 105/55 (11/08 1345) Pulse Rate: 45 (11/08 1345)  Labs: Recent Labs    02/02/20 1603 02/03/20 0823 02/03/20 1400  HGB 8.8* 8.2*  --   HCT 29.4* 27.7*  --   PLT 251 256  --   APTT  --  93* 59*  CREATININE 1.40* 1.23  --     Estimated Creatinine Clearance: 46 mL/min (by C-G formula based on SCr of 1.23 mg/dL).   Medical History: Past Medical History:  Diagnosis Date  . Anemia    a. mild, noted 04/2017.  [name] CAD in native artery    a. [name] 04/2017 s/p DES to D1, DES to prox-mid LAD, DES to prox LAD overlapping the prior stent, LVEF 55-65%.   . Chronic diastolic CHF (congestive heart failure) ([place])   . Constipation   . COPD (chronic obstructive pulmonary disease) ([place])   . Diabetic ulcer of toe ([place])   . DJD (degenerative joint disease) of cervical spine   . Dysrhythmia    AFib  . Essential hypertension   . GERD (gastroesophageal reflux disease)   . History of hiatal hernia   . HIT (heparin-induced thrombocytopenia) ([place])   . Hypothyroidism   . Hypoxia    a. went home on home O2 04/2017.  [name] Insomnia   . Mixed hyperlipidemia   . PAD (peripheral artery disease) ([place])   .  PAF (paroxysmal atrial fibrillation) ([place])   . PVD (peripheral vascular disease) ([place])   . Renal insufficiency   . 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  . Retinitis   . Sinus bradycardia    a. HR 30s-40s in 04/2017 -> diltiazem stopped, metoprolol reduced.  . Sleep apnea    "chose not to order CPAP at this time" (05/18/2017)  . Type 2 diabetes mellitus ([place])   . Wears glasses   . Wheezing    a. suspected COPD 04/2017. Former tobacco x 40 years.    Medications:  Infusions:  . argatroban 0.85 mcg/kg/min (02/03/20 1009)  . cefTRIAXone (ROCEPHIN)  IV Stopped (02/02/20 2220)  . vancomycin      Assessment: 86 yo male on chronic Eliquis for afib.  Admitted with foot infection, likely need for amputation, and Eliquis placed on hold.  Pt has history of HIT, so pharmacy asked to begin anticoagulation with IV argatroban.  Last dose of Eliquis taken 9 AM on 11/7.  Argatroban started at 2:30. First APTT 93. H/H, plt stable. RN does not know where lab was drawn and pt is in ultra sound right now. Will decrease by 15% and recheck.    Goal of Therapy:  aPTT 50-90 seconds Monitor platelets by anticoagulation protocol: Yes   Plan:  Decrease argatroban to 0.85 mcg/kg/min, weight 82.6kg  F/u 4hr aPTT  Monitor daily aPTT, CBC/plt Monitor for signs/symptoms of bleeding  F/u surgery and restart apixaban as able  1500 ADDENDUM: Rate was decreased per plan above around 10am. APTT drawn 4.5 hrs later is down to 59, which is therapeutic. Will continue 0.85 mcg/kg/min and recheck confirmatory level in 4 hours to ensure not dropping below goal.    Benetta Spar, PharmD, BCPS, [facility] Clinical Pharmacist  Please check AMION for all [place] phone numbers After 10:00 PM, call [place] [phone]

## 2020-02-03 NOTE — ED Notes (Signed)
Pharmacy made aware of Argatroban has only 81ml left before it is empty. States they will tube some more down.

## 2020-02-03 NOTE — Hospital Course (Addendum)
Mike Wright is a 86 y.o. male presenting with infected right foot wound. 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   Right foot Infection, concerning for osteomyelitis Patient presenting with a new foot wound on his right distal foot. Presented afebrile with stable vitals, reports noting erythema and purulent drainage from affected site along the dorsum portion for 1 week. 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 In the ED, he was given dose of ciprofloxacin due to concern for cellulitis. COVID and influenza negative. XR notable possible cortical irregularity at the fifth metatarsal head and remaining portion of the third proximal phalanx with the overlying soft tissue swelling and subcutaneous emphysema, possibly showing signs of osteomyelitis. Pt was afebrile with wbc of 9.5. Pt was started on IV ceftriaxone vancomycin. His home medications including lisinopril, lasix were continued. His HbA1c was 6.7%. Diabetes was controlled with sensitive sliding scale insulin and lantus insulin. Vascular surgery consulted, recommended Duplex USG of right lower extremity and ABI and Angio. Duplex USG of right lower extremity shows 50-74% stenosis noted in the superficial femoral artery on Rt side and total occlusion noted in the peroneal artery. Vascular USG ABI perfformed today shows Resting right ankle-brachial index indicates moderate right lower extremity arterial disease on Rt side and Resting left ankle-brachial index is within normal range on Lt side. No evidence of significant left lower extremity arterial disease. Abdominal Aortogram w/lower Extrimity as a surgical intervention was performed on 02/04/20 which showed  no stenosis in renal artery. Infrarenal abdominal aorta widely patent. In right lower extremity, The superficial femoral artery is patent.  There is mild luminal irregularity with no stenosis greater than 40%.  No hemodynamically significant lesions were identified. On Left lower Extremity The superficial femoral and popliteal arteries are patent with mild disease, with no stenosis greater than 30%. Rt Transmetatarsal amputation was performed on 02/04/2020. Pt tolerated the p[procedure well. CBC showed Hb of 7.5 on 02/06/2020. Pt was transfused 1 unit of PRBC. Repeat CBC showed Hb of 8.9.   Paroxysmal Atrial fibrillation EKG on admission notable for atrial fibrillation. HR on admission 89. His home Eliquis was held and he was started on argatroban as patient has previous history of HIT with heparin infusion. Pt was started back on Eliquis before discharge.    Recommendations  Patient needing 2 weeks of antibiotics per vascular surgery. He was started on vancomycin on 11/9 and transitioned to oral linezolid on 11th. He will continue oral linezolid through 23rd to complete two weeks.

## 2020-02-03 NOTE — Telephone Encounter (Signed)
Patient's wife called in to cancel appointment for 02/06/20 patient is in the hospital having surgery on his foot. Returned patient's wife phone and advised patient does not have an appointment with Dr Irene Limbo. Patient's wife states she will reach back out once patient is out of the hospital.

## 2020-02-03 NOTE — ED Notes (Signed)
Patient transported to Ultrasound 

## 2020-02-03 NOTE — Progress Notes (Signed)
Lower extremity arterial duplex & abi have been completed.   Preliminary results in CV Proc.   Abram Sander 02/03/2020 9:39 AM

## 2020-02-03 NOTE — Progress Notes (Signed)
ANTICOAGULATION CONSULT NOTE - Follow Up Consult  Pharmacy Consult for Argatroban Indication: atrial fibrillation, hx HIT  Allergies  Allergen Reactions  . Bactrim [Sulfamethoxazole-Trimethoprim] Other (See Comments)    Pancytopenia  . Codeine Shortness Of Breath  . Doxycycline Swelling  . Feraheme [Ferumoxytol] Other (See Comments)    Diaphoretic, chest pain  . Heparin Other (See Comments)    +HIT,  Severe bleeding (with heparin drip & large doses), tolerates low doses  . Losartan Swelling  . Oxycodone Other (See Comments)    "Made me act out of my mind" Mental status changes- hallucinations  . Latex Rash    Patient Measurements: Height: 5\' 7"  (170.2 cm) Weight: 83.3 kg (183 lb 10.3 oz) IBW/kg (Calculated) : 66.1  Vital Signs: Temp: 98.2 F (36.8 C) (11/08 1844) Temp Source: Oral (11/08 1844) BP: 157/75 (11/08 1844) Pulse Rate: 80 (11/08 1844)  Labs: Recent Labs    02/02/20 1603 02/03/20 0823 02/03/20 1400 02/03/20 1850  HGB 8.8* 8.2*  --   --   HCT 29.4* 27.7*  --   --   PLT 251 256  --   --   APTT  --  93* 59* 67*  CREATININE 1.40* 1.23  --   --     Estimated Creatinine Clearance: 46.2 mL/min (by C-G formula based on SCr of 1.23 mg/dL).   Medical History: Past Medical History:  Diagnosis Date  . Anemia    a. mild, noted 04/2017.  [name] CAD in native artery    a. [name] 04/2017 s/p DES to D1, DES to prox-mid LAD, DES to prox LAD overlapping the prior stent, LVEF 55-65%.   . Chronic diastolic CHF (congestive heart failure) ([place])   . Constipation   . COPD (chronic obstructive pulmonary disease) ([place])   . Diabetic ulcer of toe ([place])   . DJD (degenerative joint disease) of cervical spine   . Dysrhythmia    AFib  . Essential hypertension   . GERD (gastroesophageal reflux disease)   . History of hiatal hernia   . HIT (heparin-induced thrombocytopenia) ([place])   . Hypothyroidism   . Hypoxia    a. went home on home O2 04/2017.  [name] Insomnia   . Mixed  hyperlipidemia   . PAD (peripheral artery disease) ([place])   . PAF (paroxysmal atrial fibrillation) ([place])   . PVD (peripheral vascular disease) ([place])   . Renal insufficiency   . 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  . Retinitis   . Sinus bradycardia    a. HR 30s-40s in 04/2017 -> diltiazem stopped, metoprolol reduced.  . Sleep apnea    "chose not to order CPAP at this time" (05/18/2017)  . Type 2 diabetes mellitus ([place])   . Wears glasses   . Wheezing    a. suspected COPD 04/2017. Former tobacco x 40 years.    Medications:  Infusions:  . argatroban 0.85 mcg/kg/min (02/03/20 1009)  . cefTRIAXone (ROCEPHIN)  IV Stopped (02/02/20 2220)  . vancomycin      Assessment: 86 yo male on chronic apixaban for afib was admitted with foot infection, likely need for amputation; apixaban was placed on hold.  Pt has history of HIT, so pharmacy asked to begin anticoagulation with IV argatroban.  Last dose of apixaban was taken at 9 AM on 11/7.  aPTT ~5 hrs after first therapeutic aPTT on argatroban infusion at 0.85 mcg/kg/min was 67 sec, which remains within the goal range for this pt. H/H, platelets stable. Per RN, no  issues with IV or bleeding observed.  Goal of Therapy:  aPTT 50-90 seconds Monitor platelets by anticoagulation protocol: Yes   Plan:  Continue argatroban infusion at 0.85 mcg/kg/min (weight 82.6 kg) Monitor daily aPTT, CBC/plt Monitor for signs/symptoms of bleeding  F/U surgery and restart apixaban as able  Gillermina Hu, PharmD, BPCS, [facility] Clinical Pharmacist 02/03/20, 19:55

## 2020-02-03 NOTE — H&P (View-Only) (Signed)
    Subjective  -   No overnight events   Physical Exam:  Erythema and drainage from the right forefoot amputation site. 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  I discussed proceeding with angiography tomorrow to see if he has any options to improve his blood flow, and then later tomorrow converting him to a formal transmetatarsal amputation.  The patient is in agreement with this plan.  He will be n.p.o. after midnight.  Continue IV antibiotics.  His Eliquis is on hold and he is receiving argatroban which should be continued and not discontinued in route to his procedure tomorrow  Wells Treon Kehl 02/03/2020 8:20 AM --  Vitals:   02/03/20 0630 02/03/20 0726  BP: 131/75 (!) 142/72  Pulse: 90 89  Resp: (!) 26 (!) 25  Temp:    SpO2: 92% 94%    Intake/Output Summary (Last 24 hours) at 02/03/2020 0820 Last data filed at 02/03/2020 0100 Gross per 24 hour  Intake 300 ml  Output 400 ml  Net -100 ml     Laboratory CBC    Component Value Date/Time   WBC 9.5 02/02/2020 1603   HGB 8.8 (L) 02/02/2020 1603   HGB 12.3 (L) 05/30/2017 1531   HCT 29.4 (L) 02/02/2020 1603   HCT 37.7 05/30/2017 1531   PLT 251 02/02/2020 1603   PLT 212 05/30/2017 1531    BMET    Component Value Date/Time   NA 134 (L) 02/02/2020 1603   NA 139 05/30/2017 1531   K 3.7 02/02/2020 1603   CL 96 (L) 02/02/2020 1603   CO2 26 02/02/2020 1603   GLUCOSE 197 (H) 02/02/2020 1603   BUN 22 02/02/2020 1603   BUN 13 05/30/2017 1531   CREATININE 1.40 (H) 02/02/2020 1603   CREATININE 1.00 01/04/2018 1401   CALCIUM 8.7 (L) 02/02/2020 1603   GFRNONAA 50 (L) 02/02/2020 1603   GFRNONAA >60 01/04/2018 1401   GFRAA >60 12/04/2019 1416   GFRAA >60 01/04/2018 1401    COAG Lab Results  Component Value Date   INR 1.3  (H) 09/26/2019   INR 1.2 05/29/2019   INR 1.07 12/13/2017   No results found for: PTT  Antibiotics Anti-infectives (From admission, onward)   Start     Dose/Rate Route Frequency Ordered Stop   02/03/20 2245  vancomycin (VANCOREADY) IVPB 1250 mg/250 mL       "Followed by" Linked Group Details   1,250 mg 166.7 mL/hr over 90 Minutes Intravenous Every 24 hours 02/02/20 2130     02/02/20 2145  cefTRIAXone (ROCEPHIN) 2 g in sodium chloride 0.9 % 100 mL IVPB        2 g 200 mL/hr over 30 Minutes Intravenous Every 24 hours 02/02/20 2130     02/02/20 2130  vancomycin (VANCOREADY) IVPB 1500 mg/300 mL       "Followed by" Linked Group Details   1,500 mg 150 mL/hr over 120 Minutes Intravenous  Once 02/02/20 2130 02/03/20 0027   02/02/20 1915  ciprofloxacin (CIPRO) IVPB 400 mg        400 mg 200 mL/hr over 60 Minutes Intravenous  Once 02/02/20 1901 02/02/20 2032       V. Leia Alf, M.D., [facility] [place] Office: [phone] Pager:  [phone]

## 2020-02-03 NOTE — Progress Notes (Signed)
New Admission Note:   Arrival Method: Stretcher Mental Orientation: Alert and Oriented  Telemetry: Assessment: Completed Skin: [id number] Forearm  Pain: 0/10 Tubes: Safety Measures: Safety Fall Prevention Plan has been given, discussed and signed Admission: Completed [facility] Orientation: Patient has been orientated to the room, unit and staff.  Family: none   Orders have been reviewed and implemented. Will continue to monitor the patient. Call light has been placed within reach and bed alarm has been activated.   Tomothy Eddins RN [facility] Phone: [phone]

## 2020-02-03 NOTE — Progress Notes (Signed)
    Subjective  -   No overnight events   Physical Exam:  Erythema and drainage from the right forefoot amputation site. 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  I discussed proceeding with angiography tomorrow to see if he has any options to improve his blood flow, and then later tomorrow converting him to a formal transmetatarsal amputation.  The patient is in agreement with this plan.  He will be n.p.o. after midnight.  Continue IV antibiotics.  His Eliquis is on hold and he is receiving argatroban which should be continued and not discontinued in route to his procedure tomorrow  Wells Gurpreet Mikhail 02/03/2020 8:20 AM --  Vitals:   02/03/20 0630 02/03/20 0726  BP: 131/75 (!) 142/72  Pulse: 90 89  Resp: (!) 26 (!) 25  Temp:    SpO2: 92% 94%    Intake/Output Summary (Last 24 hours) at 02/03/2020 0820 Last data filed at 02/03/2020 0100 Gross per 24 hour  Intake 300 ml  Output 400 ml  Net -100 ml     Laboratory CBC    Component Value Date/Time   WBC 9.5 02/02/2020 1603   HGB 8.8 (L) 02/02/2020 1603   HGB 12.3 (L) 05/30/2017 1531   HCT 29.4 (L) 02/02/2020 1603   HCT 37.7 05/30/2017 1531   PLT 251 02/02/2020 1603   PLT 212 05/30/2017 1531    BMET    Component Value Date/Time   NA 134 (L) 02/02/2020 1603   NA 139 05/30/2017 1531   K 3.7 02/02/2020 1603   CL 96 (L) 02/02/2020 1603   CO2 26 02/02/2020 1603   GLUCOSE 197 (H) 02/02/2020 1603   BUN 22 02/02/2020 1603   BUN 13 05/30/2017 1531   CREATININE 1.40 (H) 02/02/2020 1603   CREATININE 1.00 01/04/2018 1401   CALCIUM 8.7 (L) 02/02/2020 1603   GFRNONAA 50 (L) 02/02/2020 1603   GFRNONAA >60 01/04/2018 1401   GFRAA >60 12/04/2019 1416   GFRAA >60 01/04/2018 1401    COAG Lab Results  Component Value Date   INR 1.3  (H) 09/26/2019   INR 1.2 05/29/2019   INR 1.07 12/13/2017   No results found for: PTT  Antibiotics Anti-infectives (From admission, onward)   Start     Dose/Rate Route Frequency Ordered Stop   02/03/20 2245  vancomycin (VANCOREADY) IVPB 1250 mg/250 mL       "Followed by" Linked Group Details   1,250 mg 166.7 mL/hr over 90 Minutes Intravenous Every 24 hours 02/02/20 2130     02/02/20 2145  cefTRIAXone (ROCEPHIN) 2 g in sodium chloride 0.9 % 100 mL IVPB        2 g 200 mL/hr over 30 Minutes Intravenous Every 24 hours 02/02/20 2130     02/02/20 2130  vancomycin (VANCOREADY) IVPB 1500 mg/300 mL       "Followed by" Linked Group Details   1,500 mg 150 mL/hr over 120 Minutes Intravenous  Once 02/02/20 2130 02/03/20 0027   02/02/20 1915  ciprofloxacin (CIPRO) IVPB 400 mg        400 mg 200 mL/hr over 60 Minutes Intravenous  Once 02/02/20 1901 02/02/20 2032       V. Leia Alf, M.D., [facility] [place] Office: [phone] Pager:  [phone]

## 2020-02-03 NOTE — Progress Notes (Signed)
Family Medicine Teaching Service Daily Progress Note Intern Pager: [phone]  Patient name: Mike Wright Medical record number: [id number] Date of birth: 1934/01/04 Age: 86 y.o. Gender: male  Primary Care Provider: Monico Blitz, MD Consultants: Vascular  Code Status: DNR  Pt Overview and Major Events to Date:  Admission 02/02/2020  Assessment and Plan: Mike Wright is a 86 y.o. male presenting with right foot wound . 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 Right foot Infection, concerning for osteomyelitis Patient presenting with a new foot wound on his right foot. Presented afebrile, reports noting erythema and purulent drainage from affected site along the dorsum portion for the past week. 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 In the ED, given dose of ciprofloxacin due to concern for cellulitis. XR notable possible cortical irregularity at the fifth metatarsal head and remaining portion of the third proximal phalanx with the overlying soft tissue swelling and subcutaneous emphysema, possibly showing signs of osteomyelitis. This morning, CBC still pending. Pt is Afebrile, vitals are stable. Blood and urine cultures are still pending. Pt was NPO last night. Vascular Surgeon was there while we were evaluating pt. Recommended Angio tomorrow afternoon to see if the blood flow can be improved and transmetatarsal amputation surgery afterwards if needed. Duplex USG of right lower extremity shows 50-74% stenosis noted in the superficial femoral artery on Rt side and total occlusion noted in the peroneal artery. Vascular USG ABI perfformed today shows Resting right ankle-brachial index indicates moderate right  lower extremity arterial disease on Rt side and Resting left ankle-brachial index is within normal range on Lt side. No evidence of significant left lower extremity arterial disease.   -Vascular surgery consulted, appreciate involvement and recommendations-likely need transmetatarsal amputation. Advised Angio tomorrow afternoon and surgery afterwards. -Continue IV ceftriaxone 2 g -Continue IV vancomycin  -pending MRSA PCR (Positive MRSA on 09/26/19) -Normal diet until Midnight. -f/u blood culture -f/u vascular US ABI -monitor vitals  -up with assistance -PT/OT  Hypertension Recent Blood pressure on 139/39mmHg. BP's in th ED have been stable . Patient also prescribed Lasix 80 mg twice daily for history of "fluid overload".  -continue home lisinopril  -continue home lasix 80 mg daily -monitor BP  Hypokalemia  Today K is 3.3.  -KLOR 40 mEq was ordered. -Repeat CMP Tomorrow morning.  Hyponatremia Admitted with mild hyponatremia Na 134.  Likely secondary to hyperglycemia. When corrected, 136.  -continue to monitor. -AM CMP shows normal Na of 137.  -Will repeat tomorrow morning.  Type 2 diabetes Most recent HbA1c 6.7% (April 2021) and CBG at 0748 today is 144.  Pt is on 15 units Lantus BID and on sensitive scale insulin. Also with peripheral neuropathy and blindness of left eye.  -Lantus 15 units bid  -monitor CBGs -pending A1c  -f/u ophthalmologist outpatient   CAD  PAD  Pt c/o SOB on and off. He doesn't feel any fluid overload. Patient stent placement in 2019. Most recent echo notable for EF 65-70% with normal function of LV without regional wall motion abnormalities. Moderate LV hypertrophy and moderate calcification of aortic valve noted. No ST elevations noted on EKG. Patient with 60-pack-year smoking history, quit smoking in the 80s. Patient taking Lasix 80 mg daily and Lipitor 80 mg  daily. -continue home lasix daily   Microcytic anemia  Chronic. Most recent Hb is 8.2   And MCV of 74.5, likely secondary to anemia of chronic disease. Both endoscopy and colonoscopy performed in 2020. EGD demonstrated removal of  2 duodenal polyps. Colonoscopy notable for multiple diverticula in the sigmoid and decision colon. No active source of bleeding noted. Has to get blood transfusions about every 2 months coordinated by his PCP in [place]. Takes folic acid at home, currently not on iron supplement at home.  -Monitor Hgb with daily CBCs, transfusion threshold <8 -Draw labs with pediatric tubes to minimize blood loss -f/u with PCP, consider starting iron supplementation outpatient   CKD 3a Chronic. GFR on admission 50, Cr 1.4 on admission appears to be around patient's baseline. -Avoid nephrotoxic meds -am CMP shows GFR 58 and Normal Creatinine of 1.23.  History OSA (2018) As patient's oxygenation was well maintained during his sleep study he preferred not to go on CPAP. However he is on 2L [place] at night at home, other than at night patient does not require oxygen during the day at baseline. Pt's saturation between 92-95%. -continue 2L O2 at night as needed  HLD Last lipid panel December 2020 normal except for HDL low at 16. ASCVD risk 45.8%. Patient taking Atorvastatin 80mg  daily.  -Continue Atorvastatin 80mg   COPD Per patient, has had a 5-6 year history of diagnosis noted in patient's chart. However, patient's recent spirometry was normal in March 2019. Home meds include duoneb.  -continue duoneb -monitor respiratory status -consider outpatient PFTs   Chronic dizziness  Orthostatic hypotension H/o  occasional dizziness with onset at random times throughout the day, not necessarily associated with activity or positional changes. This morning he denied any dizziness.Taking meclizine 25 mg daily at home. History of orthostatic hypotension, no home meds. Current BP- 139/40mmHG -hold home meclizine  -consider orthostatics if needed  -continue to monitor   Paroxysmal  Atrial fibrillation Current HR-84/min. EKG on admission notable for atrial fibrillation. Home meds include eliquis 5 mg bid. 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  -hold home eliquis -Continue argatroban   GERD Home med includes protonix 40 mg bid. -continue home med .  FEN/GI: Normal diet until Midnight 11/9. NPO afterwards to prepare for Angio and possible Surgery tomorrow afternoon. Prophylaxis: Argatroban (pt with hx of HIT)  Disposition: Admit to medical telemetry   FEN/GI: NPO at midnight 11/9 Prophylaxis: Argatroban (pt with hx of HIT)  Disposition: Med/Tele  Subjective: Pt is resting in the bed comfortably. Vitals stable. He denies any complaints. Pt states he saw a lesion in the Rt foot and then purulent fluid started coming out for last 1 week. He denies any fever. Noted erythema surrounding the wound. Denies any chest pain and abdominal pain. He lives at home with wife. Admits to SOB off and on at baseline also. States he is not fluid overloaded. He is a reliable source for history.  Objective: Temp:  [97.9 F (36.6 C)-98.2 F (36.8 C)] 98.2 F (36.8 C) (11/08 0553) Pulse Rate:  [61-90] 89 (11/08 1130) Resp:  [12-29] 28 (11/08 1130) BP: (104-176)/(52-98) 133/69 (11/08 1130) SpO2:  [90 %-100 %] 93 % (11/08 1130) Weight:  [182 lb (82.6 kg)] 182 lb (82.6 kg) (11/07 1554) Physical Exam: General:  Patient laying comfortably in bed, in no acute distress.  Cardiovascular: RRR, Systolic murmur noted.  Respiratory: B/L crackles noted at lung bases. Abdomen: soft, nontender, presence of active bowel sounds Extremities: Right foot  significant for 2-3 cm blackened circular area with minimal blood drainage but not purulent material noted with crusting noted along the outer edge, presence of surrounding erythema noted to slightly below the ankle. No abnormal findings noted on the left foot. Mild Edema noted B/L LE more on RT side. Neuro: No foal deficit.  Approappropriately conversational  Psych: Mood- Euthymic. Affect- appropriate.  Laboratory: Recent Labs  Lab 02/02/20 1603 02/03/20 0823  WBC 9.5 7.0  HGB 8.8* 8.2*  HCT 29.4* 27.7*  PLT 251 256   Recent Labs  Lab 02/02/20 1603 02/03/20 0823  NA 134* 137  K 3.7 3.3*  CL 96* 100  CO2 26 25  BUN 22 15  CREATININE 1.40* 1.23  CALCIUM 8.7* 8.4*  PROT  --  6.1*  BILITOT  --  0.6  ALKPHOS  --  59  ALT  --  13  AST  --  19  GLUCOSE 197* 147*      Imaging/Diagnostic Tests:  X Ray R ft- 02/02/2020- Possible cortical irregularity at the fifth metatarsal head and remaining portion of the third proximal phalanx with overlying soft tissue swelling and subcutaneous emphysema. This could be from osteomyelitis.    Armando Reichert, MD 02/03/2020, 11:56 AM PGY-1, [place] Intern pager: [phone], text pages welcome

## 2020-02-04 ENCOUNTER — Encounter (HOSPITAL_COMMUNITY)
Admission: EM | Disposition: A | Discharge status: Home Health | Payer: Self-pay | Source: Home / Self Care | Attending: Family Medicine

## 2020-02-04 ENCOUNTER — Inpatient Hospital Stay (HOSPITAL_COMMUNITY): Payer: Medicare HMO

## 2020-02-04 ENCOUNTER — Encounter (HOSPITAL_COMMUNITY): Payer: Self-pay | Admitting: Family Medicine

## 2020-02-04 DIAGNOSIS — S91309A Unspecified open wound, unspecified foot, initial encounter: Secondary | ICD-10-CM

## 2020-02-04 HISTORY — PX: TRANSMETATARSAL AMPUTATION: SHX6197

## 2020-02-04 HISTORY — PX: ABDOMINAL AORTOGRAM W/LOWER EXTREMITY: CATH118223

## 2020-02-04 LAB — COMPREHENSIVE METABOLIC PANEL WITH GFR
ALT: 17 U/L (ref 0–44)
AST: 22 U/L (ref 15–41)
Albumin: 3 g/dL — ABNORMAL LOW (ref 3.5–5.0)
Alkaline Phosphatase: 60 U/L (ref 38–126)
Anion gap: 11 (ref 5–15)
BUN: 17 mg/dL (ref 8–23)
CO2: 26 mmol/L (ref 22–32)
Calcium: 8.6 mg/dL — ABNORMAL LOW (ref 8.9–10.3)
Chloride: 101 mmol/L (ref 98–111)
Creatinine, Ser: 1.24 mg/dL (ref 0.61–1.24)
GFR, Estimated: 57 mL/min — ABNORMAL LOW
Glucose, Bld: 165 mg/dL — ABNORMAL HIGH (ref 70–99)
Potassium: 4.1 mmol/L (ref 3.5–5.1)
Sodium: 138 mmol/L (ref 135–145)
Total Bilirubin: 0.8 mg/dL (ref 0.3–1.2)
Total Protein: 6 g/dL — ABNORMAL LOW (ref 6.5–8.1)

## 2020-02-04 LAB — GLUCOSE, CAPILLARY
Glucose-Capillary: 179 mg/dL — ABNORMAL HIGH (ref 70–99)
Glucose-Capillary: 144 mg/dL — ABNORMAL HIGH (ref 70–99)
Glucose-Capillary: 146 mg/dL — ABNORMAL HIGH (ref 70–99)
Glucose-Capillary: 136 mg/dL — ABNORMAL HIGH (ref 70–99)
Glucose-Capillary: 308 mg/dL — ABNORMAL HIGH (ref 70–99)

## 2020-02-04 LAB — CBC
HCT: 28.5 % — ABNORMAL LOW (ref 39.0–52.0)
Hemoglobin: 8.5 g/dL — ABNORMAL LOW (ref 13.0–17.0)
MCH: 22.3 pg — ABNORMAL LOW (ref 26.0–34.0)
MCHC: 29.8 g/dL — ABNORMAL LOW (ref 30.0–36.0)
MCV: 74.6 fL — ABNORMAL LOW (ref 80.0–100.0)
Platelets: 265 10*3/uL (ref 150–400)
RBC: 3.82 MIL/uL — ABNORMAL LOW (ref 4.22–5.81)
RDW: 24.2 % — ABNORMAL HIGH (ref 11.5–15.5)
WBC: 6.9 10*3/uL (ref 4.0–10.5)
nRBC: 0 % (ref 0.0–0.2)

## 2020-02-04 LAB — MRSA PCR SCREENING: MRSA by PCR: POSITIVE — AB

## 2020-02-04 LAB — SURGICAL PCR SCREEN
MRSA, PCR: POSITIVE — AB
Staphylococcus aureus: POSITIVE — AB

## 2020-02-04 LAB — APTT: aPTT: 64 seconds — ABNORMAL HIGH (ref 24–36)

## 2020-02-04 SURGERY — ABDOMINAL AORTOGRAM W/LOWER EXTREMITY
Anesthesia: LOCAL

## 2020-02-04 SURGERY — AMPUTATION, FOOT, TRANSMETATARSAL
Anesthesia: Monitor Anesthesia Care | Site: Foot | Laterality: Right

## 2020-02-04 MED ORDER — FENTANYL CITRATE (PF) 100 MCG/2ML IJ SOLN
INTRAMUSCULAR | Status: AC
  Administered 2020-02-04: 100 ug via INTRAVENOUS
  Filled 2020-02-04: qty 2

## 2020-02-04 MED ORDER — LIDOCAINE 2% (20 MG/ML) 5 ML SYRINGE
INTRAMUSCULAR | Status: DC | PRN
  Administered 2020-02-04: 100 mg via INTRAVENOUS

## 2020-02-04 MED ORDER — ACETAMINOPHEN 500 MG PO TABS
1000.0000 mg | ORAL_TABLET | Freq: Once | ORAL | Status: AC
  Administered 2020-02-04: 1000 mg via ORAL
  Filled 2020-02-04: qty 2

## 2020-02-04 MED ORDER — MIDAZOLAM HCL 2 MG/2ML IJ SOLN
INTRAMUSCULAR | Status: DC | PRN
  Administered 2020-02-04: 1 mg via INTRAVENOUS

## 2020-02-04 MED ORDER — MIDAZOLAM HCL 2 MG/2ML IJ SOLN
INTRAMUSCULAR | Status: AC
  Filled 2020-02-04: qty 2

## 2020-02-04 MED ORDER — BUPIVACAINE LIPOSOME 1.3 % IJ SUSP
INTRAMUSCULAR | Status: DC | PRN
  Administered 2020-02-04: 10 mL via PERINEURAL

## 2020-02-04 MED ORDER — PHENYLEPHRINE 40 MCG/ML (10ML) SYRINGE FOR IV PUSH (FOR BLOOD PRESSURE SUPPORT)
PREFILLED_SYRINGE | INTRAVENOUS | Status: DC | PRN
  Administered 2020-02-04: 80 ug via INTRAVENOUS

## 2020-02-04 MED ORDER — HEPARIN (PORCINE) IN NACL 1000-0.9 UT/500ML-% IV SOLN
INTRAVENOUS | Status: AC
  Filled 2020-02-04: qty 1000

## 2020-02-04 MED ORDER — ARGATROBAN 50 MG/50ML IV SOLN
0.9000 ug/kg/min | INTRAVENOUS | Status: DC
  Administered 2020-02-05: 0.85 ug/kg/min via INTRAVENOUS
  Administered 2020-02-05 – 2020-02-06 (×2): 0.9 ug/kg/min via INTRAVENOUS
  Filled 2020-02-04 (×4): qty 50

## 2020-02-04 MED ORDER — SODIUM CHLORIDE 0.9 % IV SOLN: INTRAVENOUS | Status: DC

## 2020-02-04 MED ORDER — BUPIVACAINE HCL (PF) 0.25 % IJ SOLN
INTRAMUSCULAR | Status: DC | PRN
  Administered 2020-02-04: 15 mL

## 2020-02-04 MED ORDER — PROPOFOL 10 MG/ML IV BOLUS
INTRAVENOUS | Status: AC
  Filled 2020-02-04: qty 20

## 2020-02-04 MED ORDER — LIDOCAINE 2% (20 MG/ML) 5 ML SYRINGE
INTRAMUSCULAR | Status: AC
  Filled 2020-02-04: qty 5

## 2020-02-04 MED ORDER — PROPOFOL 1000 MG/100ML IV EMUL
INTRAVENOUS | Status: AC
  Filled 2020-02-04: qty 100

## 2020-02-04 MED ORDER — LIDOCAINE HCL (PF) 1 % IJ SOLN
INTRAMUSCULAR | Status: DC | PRN
  Administered 2020-02-04: 15 mL via INTRADERMAL

## 2020-02-04 MED ORDER — FENTANYL CITRATE (PF) 100 MCG/2ML IJ SOLN: 100.0000 ug | Freq: Once | INTRAMUSCULAR | Status: AC

## 2020-02-04 MED ORDER — FENTANYL CITRATE (PF) 100 MCG/2ML IJ SOLN
INTRAMUSCULAR | Status: AC
  Filled 2020-02-04: qty 2

## 2020-02-04 MED ORDER — LACTATED RINGERS IV SOLN: INTRAVENOUS | Status: DC | PRN

## 2020-02-04 MED ORDER — FENTANYL CITRATE (PF) 250 MCG/5ML IJ SOLN
INTRAMUSCULAR | Status: AC
  Filled 2020-02-04: qty 5

## 2020-02-04 MED ORDER — IODIXANOL 320 MG/ML IV SOLN
INTRAVENOUS | Status: DC | PRN
  Administered 2020-02-04: 110 mL

## 2020-02-04 MED ORDER — PROPOFOL 500 MG/50ML IV EMUL
INTRAVENOUS | Status: DC | PRN
  Administered 2020-02-04: 30 ug/kg/min via INTRAVENOUS

## 2020-02-04 MED ORDER — DEXAMETHASONE SODIUM PHOSPHATE 10 MG/ML IJ SOLN
INTRAMUSCULAR | Status: AC
  Filled 2020-02-04: qty 1

## 2020-02-04 MED ORDER — ONDANSETRON HCL 4 MG/2ML IJ SOLN
INTRAMUSCULAR | Status: DC | PRN
  Administered 2020-02-04: 4 mg via INTRAVENOUS

## 2020-02-04 MED ORDER — 0.9 % SODIUM CHLORIDE (POUR BTL) OPTIME
TOPICAL | Status: DC | PRN
  Administered 2020-02-04: 1000 mL

## 2020-02-04 MED ORDER — PROPOFOL 10 MG/ML IV BOLUS
INTRAVENOUS | Status: DC | PRN
  Administered 2020-02-04: 20 mg via INTRAVENOUS

## 2020-02-04 MED ORDER — CHLORHEXIDINE GLUCONATE CLOTH 2 % EX PADS
6.0000 | MEDICATED_PAD | Freq: Every day | CUTANEOUS | Status: DC
  Administered 2020-02-05 – 2020-02-07 (×3): 6 via TOPICAL

## 2020-02-04 MED ORDER — FENTANYL CITRATE (PF) 100 MCG/2ML IJ SOLN: 25.0000 ug | INTRAMUSCULAR | Status: DC | PRN

## 2020-02-04 MED ORDER — FENTANYL CITRATE (PF) 100 MCG/2ML IJ SOLN
INTRAMUSCULAR | Status: DC | PRN
  Administered 2020-02-04: 50 ug via INTRAVENOUS

## 2020-02-04 MED ORDER — LIDOCAINE HCL (PF) 1 % IJ SOLN
INTRAMUSCULAR | Status: AC
  Filled 2020-02-04: qty 30

## 2020-02-04 MED ORDER — FENTANYL CITRATE (PF) 100 MCG/2ML IJ SOLN
INTRAMUSCULAR | Status: DC | PRN
  Administered 2020-02-04 (×3): 50 ug via INTRAVENOUS

## 2020-02-04 MED ORDER — DEXAMETHASONE SODIUM PHOSPHATE 10 MG/ML IJ SOLN
INTRAMUSCULAR | Status: DC | PRN
  Administered 2020-02-04: 10 mg via INTRAVENOUS

## 2020-02-04 MED ORDER — ONDANSETRON HCL 4 MG/2ML IJ SOLN
INTRAMUSCULAR | Status: AC
  Filled 2020-02-04: qty 2

## 2020-02-04 MED ORDER — PROMETHAZINE HCL 25 MG/ML IJ SOLN: 6.2500 mg | INTRAMUSCULAR | Status: DC | PRN

## 2020-02-04 SURGICAL SUPPLY — 42 items
BLADE CORE FAN STRYKER (BLADE) ×2 IMPLANT
BNDG CONFORM 3 STRL LF (GAUZE/BANDAGES/DRESSINGS) IMPLANT
BNDG ELASTIC 4X5.8 VLCR STR LF (GAUZE/BANDAGES/DRESSINGS) ×3 IMPLANT
BNDG GAUZE ELAST 4 BULKY (GAUZE/BANDAGES/DRESSINGS) ×3 IMPLANT
CANISTER SUCT 3000ML PPV (MISCELLANEOUS) ×3 IMPLANT
COVER SURGICAL LIGHT HANDLE (MISCELLANEOUS) ×3 IMPLANT
DRAPE EXTREMITY T 121X128X90 (DISPOSABLE) ×3 IMPLANT
DRAPE HALF SHEET 40X57 (DRAPES) ×3 IMPLANT
ELECT REM PT RETURN 9FT ADLT (ELECTROSURGICAL) ×3
ELECTRODE REM PT RTRN 9FT ADLT (ELECTROSURGICAL) ×1 IMPLANT
GAUZE SPONGE 4X4 12PLY STRL (GAUZE/BANDAGES/DRESSINGS) ×3 IMPLANT
GLOVE BIOGEL PI IND STRL 6.5 (GLOVE) IMPLANT
GLOVE BIOGEL PI IND STRL 7.5 (GLOVE) ×1 IMPLANT
GLOVE BIOGEL PI INDICATOR 6.5 (GLOVE) ×4
GLOVE BIOGEL PI INDICATOR 7.5 (GLOVE) ×2
GLOVE INDICATOR 7.0 STRL GRN (GLOVE) ×4 IMPLANT
GLOVE SURG SS PI 6.5 STRL IVOR (GLOVE) ×2 IMPLANT
GLOVE SURG SS PI 7.5 STRL IVOR (GLOVE) ×3 IMPLANT
GLOVE SURG SS PI 8.0 STRL IVOR (GLOVE) ×2 IMPLANT
GOWN STRL REUS W/ TWL LRG LVL3 (GOWN DISPOSABLE) ×2 IMPLANT
GOWN STRL REUS W/ TWL XL LVL3 (GOWN DISPOSABLE) ×1 IMPLANT
GOWN STRL REUS W/TWL LRG LVL3 (GOWN DISPOSABLE) ×9
GOWN STRL REUS W/TWL XL LVL3 (GOWN DISPOSABLE) ×3
KIT BASIN OR (CUSTOM PROCEDURE TRAY) ×3 IMPLANT
KIT TURNOVER KIT B (KITS) ×3 IMPLANT
NDL HYPO 25GX1X1/2 BEV (NEEDLE) IMPLANT
NEEDLE HYPO 25GX1X1/2 BEV (NEEDLE) IMPLANT
NS IRRIG 1000ML POUR BTL (IV SOLUTION) ×3 IMPLANT
PACK GENERAL/GYN (CUSTOM PROCEDURE TRAY) ×3 IMPLANT
PAD ARMBOARD 7.5X6 YLW CONV (MISCELLANEOUS) ×6 IMPLANT
SPECIMEN JAR SMALL (MISCELLANEOUS) ×3 IMPLANT
SUT ETHILON 1 LR 30 (SUTURE) IMPLANT
SUT ETHILON 2 0 PSLX (SUTURE) ×8 IMPLANT
SUT ETHILON 3 0 PS 1 (SUTURE) ×1 IMPLANT
SWAB COLLECTION DEVICE MRSA (MISCELLANEOUS) ×2 IMPLANT
SWAB CULTURE ESWAB REG 1ML (MISCELLANEOUS) ×2 IMPLANT
SYR BULB IRRIG 60ML STRL (SYRINGE) ×2 IMPLANT
SYR CONTROL 10ML LL (SYRINGE) IMPLANT
TOWEL GREEN STERILE (TOWEL DISPOSABLE) ×6 IMPLANT
TOWEL GREEN STERILE FF (TOWEL DISPOSABLE) ×3 IMPLANT
UNDERPAD 30X36 HEAVY ABSORB (UNDERPADS AND DIAPERS) ×3 IMPLANT
WATER STERILE IRR 1000ML POUR (IV SOLUTION) ×3 IMPLANT

## 2020-02-04 SURGICAL SUPPLY — 10 items
CATH OMNI FLUSH 5F 65CM (CATHETERS) ×1 IMPLANT
CLOSURE PERCLOSE PROSTYLE (VASCULAR PRODUCTS) ×1 IMPLANT
KIT MICROPUNCTURE NIT STIFF (SHEATH) ×1 IMPLANT
KIT PV (KITS) ×2 IMPLANT
SHEATH PINNACLE 5F 10CM (SHEATH) ×1 IMPLANT
SHEATH PROBE COVER 6X72 (BAG) ×1 IMPLANT
SYR MEDRAD MARK V 150ML (SYRINGE) ×1 IMPLANT
TRANSDUCER W/STOPCOCK (MISCELLANEOUS) ×2 IMPLANT
TRAY PV CATH (CUSTOM PROCEDURE TRAY) ×2 IMPLANT
WIRE BENTSON .035X145CM (WIRE) ×1 IMPLANT

## 2020-02-04 NOTE — Op Note (Signed)
    Patient name: Mike Wright MRN: [id number] DOB: [birth year] Sex: male  02/04/2020 Pre-operative Diagnosis: Right foot ulcer Post-operative diagnosis:  Same Surgeon:  [name] Procedure Performed:  1.  Ultrasound-guided access, left femoral artery  2.  Abdominal aortogram  3.  Bilateral lower extremity runoff  4.  Second-order catheterization  5.  Closure device, Pro-glide  6.  Conscious sedation, 35 minutes     Indications: The patient has previously undergone bilateral digital amputations and percutaneous revascularization.  He presented emergency department with drainage from his right amputation site with cellulitis.  He comes in for arteriogram  Procedure:  The patient was identified in the holding area and taken to room 8.  The patient was then placed supine on the table and prepped and draped in the usual sterile fashion.  A time out was called.  Conscious sedation was administered with the use of IV fentanyl and Versed under continuous physician and nurse monitoring.  Heart rate, blood pressure, and oxygen saturation were continuously monitored.  Total sedation time was 35 minutes.  Ultrasound was used to evaluate the left common femoral artery.  It was patent .  A digital ultrasound image was acquired.  A micropuncture needle was used to access the left common femoral artery under ultrasound guidance.  An 018 wire was advanced without resistance and a micropuncture sheath was placed.  The 018 wire was removed and a benson wire was placed.  The micropuncture sheath was exchanged for a 5 french sheath.  An omniflush catheter was advanced over the wire to the level of L-1.  An abdominal angiogram was obtained.  Next, using the omniflush catheter and a benson wire, the aortic bifurcation was crossed and the catheter was placed into theright external iliac artery and right runoff was obtained.  left runoff was performed via retrograde sheath injections.  Findings:   Aortogram: No  significant renal artery stenosis.  The infrarenal abdominal aorta is widely patent.  Bilateral common and external iliac arteries are widely patent.  Right Lower Extremity: Right common femoral and profundofemoral artery are widely patent.  The superficial femoral artery is patent.  There is mild luminal irregularity with no stenosis greater than 40%.  The popliteal artery is patent throughout its course.  There is two-vessel runoff via the posterior tibial and peroneal artery.  No hemodynamically significant lesions were identified  Left Lower Extremity: Left common femoral and profundofemoral artery widely patent.  The superficial femoral and popliteal arteries are patent with mild disease, with no stenosis greater than 30%.  There is two-vessel runoff via the posterior tibial and peroneal artery  Intervention: The groin was closed with a Pro-glide  Impression:  #1  No hemodynamically significant lesions identified    V. [name], M.D., [facility] [place] Office: [phone] Pager:  [phone] [facility]

## 2020-02-04 NOTE — Transfer of Care (Signed)
Immediate Anesthesia Transfer of Care Note  Patient: Mike Wright  Procedure(s) Performed: RIGHT TRANSMETATARSAL AMPUTATION (Right Foot)  Patient Location: PACU  Anesthesia Type:MAC and Regional  Level of Consciousness: awake, alert  and oriented  Airway & Oxygen Therapy: Patient Spontanous Breathing  Post-op Assessment: Report given to RN and Post -op Vital signs reviewed and stable  Post vital signs: Reviewed and stable  Last Vitals:  Vitals Value Taken Time  BP 109/65 02/04/20 1636  Temp    Pulse 81 02/04/20 1638  Resp 21 02/04/20 1638  SpO2 93 % 02/04/20 1638  Vitals shown include unvalidated device data.  Last Pain:  Vitals:   02/04/20 1420  TempSrc:   PainSc: 0-No pain         Complications: No complications documented.

## 2020-02-04 NOTE — Progress Notes (Signed)
Patient updated on surgery delay.

## 2020-02-04 NOTE — Anesthesia Preprocedure Evaluation (Addendum)
Anesthesia Evaluation  Patient identified by MRN, date of birth, ID band Patient awake    Reviewed: Allergy & Precautions, NPO status , Patient's Chart, lab work & pertinent test results  History of Anesthesia Complications Negative for: history of anesthetic complications  Airway Mallampati: I  TM Distance: >3 FB Neck ROM: Full    Dental  (+) Edentulous Upper, Upper Dentures, Dental Advisory Given   Pulmonary shortness of breath, sleep apnea (does not use CPAP) , COPD, former smoker,    Pulmonary exam normal        Cardiovascular hypertension, + CAD, + Cardiac Stents (04/2017 s/p DES to D1, DES to prox-mid LAD, DES to prox LAD overlapping the prior stent), + Peripheral Vascular Disease and +CHF  Normal cardiovascular exam+ dysrhythmias Atrial Fibrillation   TEE 2019 Normal EF, valves ok  LHC 2019 Dist RCA lesion is 40% stenosed. Prox RCA lesion is 25% stenosed. 1st Diag lesion is 75% stenosed. A drug-eluting stent was successfully placed using a STENT SYNERGY DES 2.25X12. Post intervention, there is a 0% residual stenosis. Prox LAD to Mid LAD lesion is 75% stenosed. A drug-eluting stent was successfully placed using a STENT SYNERGY DES 3.5X38. Post intervention, there is a 0% residual stenosis. Prox LAD lesion is 70% stenosed. A drug-eluting stent was successfully placed using a STENT SYNERGY DES 4X12, overlapping the prior stent. Post intervention, there is a 0% residual stenosis. The left ventricular systolic function is normal. The left ventricular ejection fraction is 55-65% by visual estimate. LV end diastolic pressure is normal. There is no aortic valve stenosis. Ao sat 98%, PA sat 65%; PA mean 25 mm Hg; unable to wedge the catheter. Normal right heat pressures. Tortuous right subclavian making catheter navigation difficult from the right radial approach.    Neuro/Psych    GI/Hepatic hiatal  hernia, GERD  ,  Endo/Other  diabetes, Oral Hypoglycemic AgentsHypothyroidism   Renal/GU Renal disease     Musculoskeletal  (+) Arthritis ,   Abdominal   Peds  Hematology  (+) Blood dyscrasia (on eliquis), anemia ,   Anesthesia Other Findings   Reproductive/Obstetrics                            Anesthesia Physical  Anesthesia Plan  ASA: III  Anesthesia Plan: MAC   Post-op Pain Management:  Regional for Post-op pain   Induction: Intravenous  PONV Risk Score and Plan: 2 and Ondansetron, Dexamethasone and Treatment may vary due to age or medical condition  Airway Management Planned: Natural Airway  Additional Equipment:   Intra-op Plan:   Post-operative Plan:   Informed Consent: I have reviewed the patients History and Physical, chart, labs and discussed the procedure including the risks, benefits and alternatives for the proposed anesthesia with the patient or authorized representative who has indicated his/her understanding and acceptance.     Dental advisory given  Plan Discussed with: Anesthesiologist, CRNA and Surgeon  Anesthesia Plan Comments: (   )       Anesthesia Quick Evaluation

## 2020-02-04 NOTE — Progress Notes (Signed)
Pt arrived from unit from PACU. VSS.Will continue to monitor. Pt. Oriented to unit   Patterson Hollenbaugh K Naman Spychalski, RN  

## 2020-02-04 NOTE — Interval H&P Note (Signed)
History and Physical Interval Note:  02/04/2020 3:30 PM  Mike Wright  has presented today for surgery, with the diagnosis of CRITICAL LOWER LIMB ISCHEMIA.  The various methods of treatment have been discussed with the patient and family. After consideration of risks, benefits and other options for treatment, the patient has consented to  Procedure(s): RIGHT TRANSMETATARSAL AMPUTATION (Right) as a surgical intervention.  The patient's history has been reviewed, patient examined, no change in status, stable for surgery.  I have reviewed the patient's chart and labs.  Questions were answered to the patient's satisfaction.     [name]

## 2020-02-04 NOTE — Progress Notes (Signed)
ANTICOAGULATION CONSULT NOTE - Follow Up Consult  Pharmacy Consult for Argatroban Indication: atrial fibrillation, hx HIT  Allergies  Allergen Reactions  . Bactrim [Sulfamethoxazole-Trimethoprim] Other (See Comments)    Pancytopenia  . Codeine Shortness Of Breath  . Doxycycline Swelling  . Feraheme [Ferumoxytol] Other (See Comments)    Diaphoretic, chest pain  . Heparin Other (See Comments)    +HIT,  Severe bleeding (with heparin drip & large doses), tolerates low doses  . Losartan Swelling  . Oxycodone Other (See Comments)    "Made me act out of my mind" Mental status changes- hallucinations  . Latex Rash    Patient Measurements: Height: 5\' 7"  (170.2 cm) Weight: 83.3 kg (183 lb 10.3 oz) IBW/kg (Calculated) : 66.1  Vital Signs: Temp: 98 F (36.7 C) (11/09 0257) Temp Source: Oral (11/09 0257) BP: 134/79 (11/09 0257) Pulse Rate: 79 (11/09 0257)  Labs: Recent Labs    02/02/20 1603 02/02/20 1603 02/03/20 0823 02/03/20 0823 02/03/20 1400 02/03/20 1850 02/04/20 0248  HGB 8.8*   < > 8.2*  --   --   --  8.5*  HCT 29.4*  --  27.7*  --   --   --  28.5*  PLT 251  --  256  --   --   --  265  APTT  --   --  93*   < > 59* 67* 64*  CREATININE 1.40*  --  1.23  --   --   --  1.24   < > = values in this interval not displayed.    Estimated Creatinine Clearance: 45.8 mL/min (by C-G formula based on SCr of 1.24 mg/dL).   Medical History: Past Medical History:  Diagnosis Date  . Anemia    a. mild, noted 04/2017.  [name] CAD in native artery    a. [name] 04/2017 s/p DES to D1, DES to prox-mid LAD, DES to prox LAD overlapping the prior stent, LVEF 55-65%.   . Chronic diastolic CHF (congestive heart failure) ([place])   . Constipation   . COPD (chronic obstructive pulmonary disease) ([place])   . Diabetic ulcer of toe ([place])   . DJD (degenerative joint disease) of cervical spine   . Dysrhythmia    AFib  . Essential hypertension   . GERD (gastroesophageal reflux disease)   . History of  hiatal hernia   . HIT (heparin-induced thrombocytopenia) ([place])   . Hypothyroidism   . Hypoxia    a. went home on home O2 04/2017.  [name] Insomnia   . Mixed hyperlipidemia   . PAD (peripheral artery disease) ([place])   . PAF (paroxysmal atrial fibrillation) ([place])   . PVD (peripheral vascular disease) ([place])   . Renal insufficiency   . 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  . Retinitis   . Sinus bradycardia    a. HR 30s-40s in 04/2017 -> diltiazem stopped, metoprolol reduced.  . Sleep apnea    "chose not to order CPAP at this time" (05/18/2017)  . Type 2 diabetes mellitus ([place])   . Wears glasses   . Wheezing    a. suspected COPD 04/2017. Former tobacco x 40 years.    Medications:  Infusions:  . sodium chloride    . argatroban 0.85 mcg/kg/min (02/03/20 1009)  . cefTRIAXone (ROCEPHIN)  IV 2 g (02/03/20 2127)  . vancomycin 1,250 mg (02/04/20 0000)    Assessment: 86 yo male on chronic apixaban for afib was admitted with foot infection, likely need for amputation; apixaban  was placed on hold.  Pt has history of HIT, so pharmacy asked to begin anticoagulation with IV argatroban.  Last dose of apixaban was taken at 9 AM on 11/7.  This morning's aPTT is therapeutic at 64 sec on argatroban infusion 0.85 mcg/kgmin. H/H, platelets stable. No s/sx bleeding reported.  Goal of Therapy:  aPTT 50-90 seconds Monitor platelets by anticoagulation protocol: Yes   Plan:  Continue argatroban infusion at 0.85 mcg/kg/min (weight 82.6 kg) Monitor daily aPTT, CBC/plt Monitor for signs/symptoms of bleeding  F/U surgery and restart apixaban as able  Fara Olden, PharmD PGY-1 Pharmacy Resident 02/04/2020 6:26 AM Please see AMION for all pharmacy numbers

## 2020-02-04 NOTE — Progress Notes (Signed)
Upper denture plate went with OR to patient in denture cup with Erlene Quan, CRNA

## 2020-02-04 NOTE — Progress Notes (Signed)
Family Medicine Teaching Service Daily Progress Note Intern Pager: [phone]  Patient name: Mike Wright Medical record number: [id number] Date of birth: 1933-11-05 Age: 86 y.o. Gender: male  Primary Care Provider: Monico Blitz, MD Consultants: Vascular  Code Status: DNR  Pt Overview and Major Events to Date:  Admission 02/02/2020  Assessment and Plan: Mike Cerezo Butleris a 86 y.o.malepresenting with right foot wound. 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 Vascular USG ABI shows Resting right ankle-brachial index indicates moderate right lower extremity arterial disease on Rt side and Resting left ankle-brachial index is within normal range on Lt side. No evidence of significant left lower extremity arterial disease.  This morning, Pt is Afebrile, vitals are stable. Blood and urine cultures are still pending.Pt was NPO last night. Pt is scheduled for  Angio today afternoon to see if the blood flow can be improved and transmetatarsal amputation surgery afterwards if needed.  -Vascular surgery consulted, appreciate involvement and recommendations-likely need transmetatarsal amputation. Advised Angio today afternoon and surgery afterwards. -Continue IV ceftriaxone 2 g -Continue IV vancomycin  -pending MRSA PCR (Positive MRSA on 09/26/19) -f/u blood  culture -f/u vascular US ABI -monitor vitals  -up with assistance -PT/OT  Hypertension Recent Blood pressure on 144/67 mmHg. BP's have been stable . -continue home lisinopril  -continue home lasix80 mg BID -monitor BP  Hyponatremia Admitted with mild hyponatremia Na 134.  Likely secondary to hyperglycemia. Today Na is 138.  -continue to monitor.  -Will repeat tomorrow morning.  Type 2 diabetes Most recent HbA1c 6.7%(02/03/20) and CBG today is179. Pt is on 15 units Lantus BID and on sensitive scale insulin. Also with peripheral neuropathy and blindness of left eye.  -Lantus15 units bid  -monitor CBGs  -f/u ophthalmologist outpatient  CAD PAD Pt denies any SOB today. He doesn't feel any fluid overload. Patient stent placement in 2019. Most recent echo notable for EF 65-70% with normal function of LV without regional wall motion abnormalities. Moderate LV hypertrophy and moderate calcification of aortic valve noted. No ST elevations noted on EKG. Patient takingLasix 80 mg dailyand Lipitor 80 mg daily. -continue home lasix daily.  Microcytic anemia Chronic. Most recent Hb is 8.5  And MCV of 74.6,likely secondary to anemia of chronic disease.Both endoscopy and colonoscopy performed in 2020. EGD demonstrated removal of 2 duodenal polyps.  No active source of bleeding noted. Has to get blood transfusions about every 2 months coordinated by his PCP in [place].Takes folic acid at home, currently not on iron supplement at home. -MonitorHgb with daily CBCs, transfusion threshold <8 -Drawlabs with pediatric tubes to minimize blood loss -f/u with PCP, consider starting iron supplementation outpatient  CKD 3a Chronic.GFR today is  57, Cr 1.24  -Avoid nephrotoxic meds   History OSA (2018) As patient's oxygenation was well maintained during his sleep study he preferred not to go on CPAP.However he is on 2L [place] at nightat home,  Pt's saturation between 98%. -continue  2L O2 at night  as needed  HLD Last lipid panel December 2020 normal except for HDL low at 16. ASCVD risk 45.8%. Patient taking Atorvastatin 80mg  daily.  -Continue Atorvastatin 80mg   COPD patient's recent spirometry was normal in March 2019. Home meds include duoneb.  -continue duoneb -monitor respiratory status -consider outpatient PFTs   Chronic dizziness Orthostatic hypotension  This morning he denied any dizziness.Taking meclizine 25 mg daily at home. History of orthostatic hypotension, no home meds.Current BP- 134/79 mmHG -hold home meclizine  -consider orthostatics if needed -continue to monitor  Paroxysmal Atrial fibrillation Current HR-79/min. EKG on admission notable for atrial fibrillation.Home meds include eliquis 5 mg bid. 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  -hold home eliquis -Continue argatroban   GERD Home med includes protonix 40 mg bid. -continue home med.  FEN/GI:Pt is NPO.  Prophylaxis:Argatroban (pt with hx of HIT)  Disposition:Admit to medical telemetry   Disposition: Med/Tele   Subjective:  Pt is laying in his bed. Pt denies any pain. Pt states his wound was bleeding yesterday during the usg so they put bandage. He denies any SOB, Chest pain, abdominal pain, headache, dizziness, and diarrhea. Pt c/o mild constipation.   Objective: Temp:  [98 F (36.7 C)-98.2 F (36.8 C)] 98 F (36.7 C) (11/09 0257) Pulse Rate:  [45-90] 79 (11/09 0257) Resp:  [12-28] 20 (11/09 0257) BP: (104-162)/(55-142) 134/79 (11/09 0257) SpO2:  [90 %-100 %] 98 % (11/09 0257) Weight:  [183 lb 10.3 oz (83.3 kg)] 183 lb 10.3 oz (83.3 kg) (11/08 2245) Physical Exam: General: Patient laying comfortably in bed, in no acute distress.  Cardiovascular, RRR, Systolic murmur noted.  Respiratory: MIld B/L crackles noted at lung bases.  Abdomen: soft, nontender, presence of active bowel sounds Extremities: Right foot significant for 2-3 cm  blackened circular area with minimal blood drainage but not purulent material noted with crusting noted along the outer edge, presence of surrounding erythema noted to slightly below the ankle. No abnormal findings noted on the left foot.Mild Edema noted B/L LE more on RT side. Neuro: No foal deficit. Approappropriately conversational Psych: Mood- Euthymic. Affect- appropriate.  Laboratory: Recent Labs  Lab 02/02/20 1603 02/03/20 0823 02/04/20 0248  WBC 9.5 7.0 6.9  HGB 8.8* 8.2* 8.5*  HCT 29.4* 27.7* 28.5*  PLT 251 256 265   Recent Labs  Lab 02/02/20 1603 02/03/20 0823 02/04/20 0248  NA 134* 137 138  K 3.7 3.3* 4.1  CL 96* 100 101  CO2 26 25 26   BUN 22 15 17   CREATININE 1.40* 1.23 1.24  CALCIUM 8.7* 8.4* 8.6*  PROT  --  6.1* 6.0*  BILITOT  --  0.6 0.8  ALKPHOS  --  59 60  ALT  --  13 17  AST  --  19 22  GLUCOSE 197* 147* 165*     Imaging/Diagnostic Tests: X Ray R ft- 02/02/2020- Possible cortical irregularity at the fifth metatarsal head and remaining portion of the third proximal phalanx with overlying soft tissue swelling and subcutaneous emphysema. This could be from osteomyelitis.  Vascular USG ABI- Resting right ankle-brachial index indicates moderate right lower extremity arterial disease on Rt side  Resting left ankle-brachial index is within normal range on Lt side. No evidence of significant left lower extremity arterial disease.   Armando Reichert, MD 02/04/2020, 5:58 AM PGY-1, [place] Intern pager: [phone], text pages welcome

## 2020-02-04 NOTE — Interval H&P Note (Signed)
History and Physical Interval Note:  02/04/2020 11:31 AM  Mike Wright  has presented today for surgery, with the diagnosis of ischemia.  The various methods of treatment have been discussed with the patient and family. After consideration of risks, benefits and other options for treatment, the patient has consented to  Procedure(s): ABDOMINAL AORTOGRAM W/LOWER EXTREMITY (N/A) as a surgical intervention.  The patient's history has been reviewed, patient examined, no change in status, stable for surgery.  I have reviewed the patient's chart and labs.  Questions were answered to the patient's satisfaction.     [name]

## 2020-02-04 NOTE — Progress Notes (Signed)
Patient name: Mike Wright MRN: [id number] DOB: 11/19/1933 Sex: male    HISTORY OF PRESENT ILLNESS:   Mike Wright is a 86 y.o. male who recently underwent angiography to confirm patency of his prior interventions.  He has drainage from his right foot amputation site.  He comes in today for surgical repai  CURRENT MEDICATIONS:    Current Facility-Administered Medications  Medication Dose Route Frequency Provider Last Rate Last Admin  . 0.9 %  sodium chloride infusion   Intravenous Continuous Serafina Mitchell, MD      . 0.9 % irrigation (POUR BTL)    PRN Serafina Mitchell, MD   1,000 mL at 02/04/20 1412  . [MAR Hold] acetaminophen (TYLENOL) tablet 650 mg  650 mg Oral Q6H PRN Daisy Floro, DO       Or  . Doug Sou Hold] acetaminophen (TYLENOL) suppository 650 mg  650 mg Rectal Q6H PRN Daisy Floro, DO      . argatroban 1 mg/mL infusion  0.85 mcg/kg/min Intravenous Continuous Donnamae Jude, [facility]   Stopped at 02/04/20 1242  . [MAR Hold] atorvastatin (LIPITOR) tablet 80 mg  80 mg Oral QPM Milus Banister C, DO   80 mg at 02/03/20 1656  . [MAR Hold] cefTRIAXone (ROCEPHIN) 2 g in sodium chloride 0.9 % 100 mL IVPB  2 g Intravenous Q24H Milus Banister C, DO 200 mL/hr at 02/03/20 2127 2 g at 02/03/20 2127  . [MAR Hold] furosemide (LASIX) tablet 80 mg  80 mg Oral BID Armando Reichert, MD   80 mg at 02/04/20 0917  . [MAR Hold] insulin aspart (novoLOG) injection 0-9 Units  0-9 Units Subcutaneous TID WC Milus Banister C, DO   2 Units at 02/04/20 [phone]  . [MAR Hold] insulin glargine (LANTUS) injection 15 Units  15 Units Subcutaneous BID Daisy Floro, DO   15 Units at 02/04/20 3790  . [MAR Hold] ipratropium-albuterol (DUONEB) 0.5-2.5 (3) MG/3ML nebulizer solution 3 mL  3 mL Nebulization Daily Serafina Mitchell, MD   3 mL at 02/04/20 0856  . [MAR Hold] isosorbide mononitrate (IMDUR) 24 hr tablet 30 mg  30 mg Oral BID Milus Banister C, DO   30 mg at  02/04/20 0916  . [MAR Hold] lisinopril (ZESTRIL) tablet 2.5 mg  2.5 mg Oral Daily Milus Banister C, DO   2.5 mg at 02/04/20 0916  . midazolam (VERSED) 2 MG/2ML injection           . [MAR Hold] mupirocin ointment (BACTROBAN) 2 % 1 application  1 application Nasal BID Serafina Mitchell, MD   1 application at 24/09/73 1105  . [MAR Hold] pantoprazole (PROTONIX) EC tablet 40 mg  40 mg Oral BID Milus Banister C, DO   40 mg at 02/04/20 0916  . [MAR Hold] polyethylene glycol (MIRALAX / GLYCOLAX) packet 17 g  17 g Oral Daily PRN Daisy Floro, DO      . [MAR Hold] traZODone (DESYREL) tablet 50 mg  50 mg Oral QHS Milus Banister C, DO   50 mg at 02/03/20 2116  . [MAR Hold] umeclidinium bromide (INCRUSE ELLIPTA) 62.5 MCG/INH 1 puff  1 puff Inhalation Daily Milus Banister C, DO   1 puff at 02/04/20 0856  . [MAR Hold] vancomycin (VANCOREADY) IVPB 1250 mg/250 mL  1,250 mg Intravenous Q24H Milus Banister C, DO 166.7 mL/hr at 02/04/20 0000 1,250 mg at 02/04/20 0000   Facility-Administered Medications Ordered in Other Encounters  Medication Dose Route Frequency Provider  Last Rate Last Admin  . bupivacaine (PF) (MARCAINE) 0.25 % injection   Other Anesthesia Intra-op Duane Boston, MD   15 mL at 02/04/20 1412  . bupivacaine liposome (EXPAREL) 1.3 % injection   Peri-NEURAL Anesthesia Intra-op Duane Boston, MD   10 mL at 02/04/20 1412    REVIEW OF SYSTEMS:   [X]  denotes positive finding, [ ]  denotes negative finding Cardiac  Comments:  Chest pain or chest pressure:    Shortness of breath upon exertion:    Short of breath when lying flat:    Irregular heart rhythm:    Constitutional    Fever or chills:      PHYSICAL EXAM:   Vitals:   02/04/20 0915 02/04/20 1405 02/04/20 1410 02/04/20 1415  BP: (!) 144/67 (!) 163/71 (!) 155/62 137/68  Pulse: 77 80 77 77  Resp: 20 (!) 33 17 15  Temp: 98.2 F (36.8 C)     TempSrc: Oral     SpO2: 94% 100% 99% 98%  Weight:      Height:        GENERAL:  The patient is a well-nourished male, in no acute distress. The vital signs are documented above. CARDIOVASCULAR: There is a regular rate and rhythm. PULMONARY: Non-labored respirations Doppler right dorsalis pedis Drainage from amputation site of right foot  STUDIES:   Angiography shows patent lower extreme vasculature on the right   MEDICAL ISSUES:   Plan for formal transmetatarsal amputation of the right.  This was discussed with the patient and his wife.  He wishes to proceed.  Leia Alf, MD, FACS [facility] [phone] Pager [phone]

## 2020-02-04 NOTE — Plan of Care (Signed)
  Problem: Education: Goal: Knowledge of General Education information will improve Description Including pain rating scale, medication(s)/side effects and non-pharmacologic comfort measures Outcome: Progressing   

## 2020-02-04 NOTE — Anesthesia Procedure Notes (Signed)
Anesthesia Regional Block: Popliteal block   Pre-Anesthetic Checklist: ,, timeout performed, Correct Patient, Correct Site, Correct Laterality, Correct Procedure, Correct Position, site marked, Risks and benefits discussed,  Surgical consent,  Pre-op evaluation,  At surgeon's request and post-op pain management  Laterality: Right  Prep: chloraprep       Needles:  Injection technique: Single-shot  Needle Type: Echogenic Stimulator Needle          Additional Needles:   Narrative:  Start time: 02/04/2020 2:04 PM End time: 02/04/2020 2:14 PM Injection made incrementally with aspirations every 5 mL.  Performed by: Personally  Anesthesiologist: Duane Boston, MD  Additional Notes: A functioning IV was confirmed and monitors were applied.  Sterile prep and drape, hand hygiene and sterile gloves were used.  Negative aspiration and test dose prior to incremental administration of local anesthetic. The patient tolerated the procedure well.Ultrasound  guidance: relevant anatomy identified, needle position confirmed, local anesthetic spread visualized around nerve(s), vascular puncture avoided.  Image printed for medical record.

## 2020-02-04 NOTE — Anesthesia Postprocedure Evaluation (Signed)
Anesthesia Post Note  Patient: Mike Wright  Procedure(s) Performed: RIGHT TRANSMETATARSAL AMPUTATION (Right Foot)     Patient location during evaluation: PACU Anesthesia Type: MAC Level of consciousness: awake and alert Pain management: pain level controlled Vital Signs Assessment: post-procedure vital signs reviewed and stable Respiratory status: spontaneous breathing and respiratory function stable Cardiovascular status: stable Postop Assessment: no apparent nausea or vomiting Anesthetic complications: no   No complications documented.  Last Vitals:  Vitals:   02/04/20 1718 02/04/20 1731  BP: 116/65   Pulse: 64   Resp: 19   Temp:  36.6 C  SpO2: 98%     Last Pain:  Vitals:   02/04/20 1703  TempSrc:   PainSc: 0-No pain                 Arissa Fagin DANIEL

## 2020-02-05 ENCOUNTER — Encounter (HOSPITAL_COMMUNITY): Payer: Self-pay | Admitting: Surgery

## 2020-02-05 ENCOUNTER — Encounter (INDEPENDENT_AMBULATORY_CARE_PROVIDER_SITE_OTHER): Payer: Medicare HMO | Admitting: Ophthalmology

## 2020-02-05 DIAGNOSIS — S91301A Unspecified open wound, right foot, initial encounter: Secondary | ICD-10-CM

## 2020-02-05 LAB — COMPREHENSIVE METABOLIC PANEL
ALT: 14 U/L (ref 0–44)
AST: 17 U/L (ref 15–41)
Albumin: 2.8 g/dL — ABNORMAL LOW (ref 3.5–5.0)
Alkaline Phosphatase: 62 U/L (ref 38–126)
Anion gap: 13 (ref 5–15)
BUN: 21 mg/dL (ref 8–23)
CO2: 20 mmol/L — ABNORMAL LOW (ref 22–32)
Calcium: 8 mg/dL — ABNORMAL LOW (ref 8.9–10.3)
Chloride: 100 mmol/L (ref 98–111)
Creatinine, Ser: 1.23 mg/dL (ref 0.61–1.24)
GFR, Estimated: 58 mL/min — ABNORMAL LOW (ref >60)
Glucose, Bld: 413 mg/dL — ABNORMAL HIGH (ref 70–99)
Potassium: 4.2 mmol/L (ref 3.5–5.1)
Sodium: 133 mmol/L — ABNORMAL LOW (ref 135–145)
Total Bilirubin: 0.7 mg/dL (ref 0.3–1.2)
Total Protein: 6 g/dL — ABNORMAL LOW (ref 6.5–8.1)

## 2020-02-05 LAB — CBC
HCT: 27.2 % — ABNORMAL LOW (ref 39.0–52.0)
Hemoglobin: 8.2 g/dL — ABNORMAL LOW (ref 13.0–17.0)
MCH: 22.7 pg — ABNORMAL LOW (ref 26.0–34.0)
MCHC: 30.1 g/dL (ref 30.0–36.0)
MCV: 75.3 fL — ABNORMAL LOW (ref 80.0–100.0)
Platelets: 245 10*3/uL (ref 150–400)
RBC: 3.61 MIL/uL — ABNORMAL LOW (ref 4.22–5.81)
RDW: 24.1 % — ABNORMAL HIGH (ref 11.5–15.5)
WBC: 6.8 10*3/uL (ref 4.0–10.5)
nRBC: 0 % (ref 0.0–0.2)

## 2020-02-05 LAB — GLUCOSE, CAPILLARY
Glucose-Capillary: 240 mg/dL — ABNORMAL HIGH (ref 70–99)
Glucose-Capillary: 283 mg/dL — ABNORMAL HIGH (ref 70–99)
Glucose-Capillary: 244 mg/dL — ABNORMAL HIGH (ref 70–99)
Glucose-Capillary: 164 mg/dL — ABNORMAL HIGH (ref 70–99)

## 2020-02-05 LAB — APTT
aPTT: 38 seconds — ABNORMAL HIGH (ref 24–36)
aPTT: 57 seconds — ABNORMAL HIGH (ref 24–36)

## 2020-02-05 LAB — VANCOMYCIN, TROUGH: Vancomycin Tr: 13 ug/mL — ABNORMAL LOW (ref 15–20)

## 2020-02-05 MED ORDER — INSULIN GLARGINE 100 UNIT/ML ~~LOC~~ SOLN
20.0000 [IU] | Freq: Two times a day (BID) | SUBCUTANEOUS | Status: DC
  Administered 2020-02-05 – 2020-02-07 (×4): 20 [IU] via SUBCUTANEOUS
  Filled 2020-02-05 (×5): qty 0.2

## 2020-02-05 MED FILL — Heparin Sod (Porcine)-NaCl IV Soln 1000 Unit/500ML-0.9%: INTRAVENOUS | Qty: 1000 | Status: AC

## 2020-02-05 NOTE — Progress Notes (Addendum)
  Progress Note    02/05/2020 8:13 AM 1 Day Post-Op  Subjective:  Very minimal pain this morning. He says he hallucinated a tornado in his room last night.   Vitals:   02/05/20 0426 02/05/20 0744  BP: 133/60 135/61  Pulse: 60 63  Resp: 18 19  Temp: (!) 97.5 F (36.4 C) 97.6 F (36.4 C)  SpO2: 92% 97%    Physical Exam: Gen: Alert and oriented times 4 Cardiac:  Regular rate; irreg rhythm Lungs:  CTAB Extremities:  Right surgical dressing dry and intact. Left foot warm Left groin: soft without hematoma  CBC    Component Value Date/Time   WBC 6.8 02/05/2020 0110   RBC 3.61 (L) 02/05/2020 0110   HGB 8.2 (L) 02/05/2020 0110   HGB 12.3 (L) 05/30/2017 1531   HCT 27.2 (L) 02/05/2020 0110   HCT 37.7 05/30/2017 1531   PLT 245 02/05/2020 0110   PLT 212 05/30/2017 1531   MCV 75.3 (L) 02/05/2020 0110   MCV 86 05/30/2017 1531   MCH 22.7 (L) 02/05/2020 0110   MCHC 30.1 02/05/2020 0110   RDW 24.1 (H) 02/05/2020 0110   RDW 16.0 (H) 05/30/2017 1531   LYMPHSABS 0.9 02/02/2020 1603   MONOABS 2.2 (H) 02/02/2020 1603   EOSABS 0.1 02/02/2020 1603   BASOSABS 0.0 02/02/2020 1603    BMET    Component Value Date/Time   NA 133 (L) 02/05/2020 0110   NA 139 05/30/2017 1531   K 4.2 02/05/2020 0110   CL 100 02/05/2020 0110   CO2 20 (L) 02/05/2020 0110   GLUCOSE 413 (H) 02/05/2020 0110   BUN 21 02/05/2020 0110   BUN 13 05/30/2017 1531   CREATININE 1.23 02/05/2020 0110   CREATININE 1.00 01/04/2018 1401   CALCIUM 8.0 (L) 02/05/2020 0110   GFRNONAA 58 (L) 02/05/2020 0110   GFRNONAA >60 01/04/2018 1401   GFRAA >60 12/04/2019 1416   GFRAA >60 01/04/2018 1401     Intake/Output Summary (Last 24 hours) at 02/05/2020 0813 Last data filed at 02/04/2020 2308 Gross per 24 hour  Intake 1080 ml  Output 1100 ml  Net -20 ml    HOSPITAL MEDICATIONS Scheduled Meds: . atorvastatin  80 mg Oral QPM  . Chlorhexidine Gluconate Cloth  6 each Topical Q0600  . furosemide  80 mg Oral BID  .  insulin aspart  0-9 Units Subcutaneous TID WC  . insulin glargine  15 Units Subcutaneous BID  . ipratropium-albuterol  3 mL Nebulization Daily  . isosorbide mononitrate  30 mg Oral BID  . lisinopril  2.5 mg Oral Daily  . mupirocin ointment  1 application Nasal BID  . pantoprazole  40 mg Oral BID  . traZODone  50 mg Oral QHS  . umeclidinium bromide  1 puff Inhalation Daily   Continuous Infusions: . sodium chloride 100 mL/hr at 02/05/20 0653  . argatroban 0.85 mcg/kg/min (02/05/20 0708)  . cefTRIAXone (ROCEPHIN)  IV 2 g (02/04/20 2154)  . vancomycin 1,250 mg (02/04/20 2308)   PRN Meds:.acetaminophen **OR** acetaminophen, polyethylene glycol  Assessment: POD 1 revision of left TMA and aortogram with bilateral LE runoff via left femoral artery. VSS. Afebrile. Argatroban restarted this am   Plan: -Follow-up intra-op culture results. Abx continue. Take down dressing tomorrow -DVT prophylaxis:  On argatroban   Risa Grill, PA-C [facility] [phone] 02/05/2020  8:13 AM   I agree with the above Change dressing in am Continue abx and f/u cx Possible d/c tomorrow  [name]

## 2020-02-05 NOTE — Progress Notes (Signed)
ANTICOAGULATION CONSULT NOTE - Follow Up Consult  Pharmacy Consult for Argatroban Indication: atrial fibrillation, hx HIT  Allergies  Allergen Reactions  . Bactrim [Sulfamethoxazole-Trimethoprim] Other (See Comments)    Pancytopenia  . Codeine Shortness Of Breath  . Doxycycline Swelling  . Feraheme [Ferumoxytol] Other (See Comments)    Diaphoretic, chest pain  . Heparin Other (See Comments)    +HIT,  Severe bleeding (with heparin drip & large doses), tolerates low doses  . Losartan Swelling  . Oxycodone Other (See Comments)    "Made me act out of my mind" Mental status changes- hallucinations  . Latex Rash    Patient Measurements: Height: 5\' 7"  (170.2 cm) Weight: 83.3 kg (183 lb 10.3 oz) IBW/kg (Calculated) : 66.1  Vital Signs: Temp: 97.5 F (36.4 C) (11/10 1553) Temp Source: Oral (11/10 1553) BP: 122/57 (11/10 1600) Pulse Rate: 65 (11/10 1600)  Labs: Recent Labs    02/03/20 0823 02/03/20 1400 02/04/20 0248 02/05/20 0110 02/05/20 1158 02/05/20 1746  HGB 8.2*  --  8.5* 8.2*  --   --   HCT 27.7*  --  28.5* 27.2*  --   --   PLT 256  --  265 245  --   --   APTT 93*   < > 64*  --  38* 57*  CREATININE 1.23  --  1.24 1.23  --   --    < > = values in this interval not displayed.    Estimated Creatinine Clearance: 46.2 mL/min (by C-G formula based on SCr of 1.23 mg/dL).   Medical History: Past Medical History:  Diagnosis Date  . Anemia    a. mild, noted 04/2017.  [name] CAD in native artery    a. [name] 04/2017 s/p DES to D1, DES to prox-mid LAD, DES to prox LAD overlapping the prior stent, LVEF 55-65%.   . Chronic diastolic CHF (congestive heart failure) ([place])   . Constipation   . COPD (chronic obstructive pulmonary disease) ([place])   . Diabetic ulcer of toe ([place])   . DJD (degenerative joint disease) of cervical spine   . Dysrhythmia    AFib  . Essential hypertension   . GERD (gastroesophageal reflux disease)   . History of hiatal hernia   . HIT (heparin-induced  thrombocytopenia) ([place])   . Hypothyroidism   . Hypoxia    a. went home on home O2 04/2017.  [name] Insomnia   . Mixed hyperlipidemia   . PAD (peripheral artery disease) ([place])   . PAF (paroxysmal atrial fibrillation) ([place])   . PVD (peripheral vascular disease) ([place])   . Renal insufficiency   . 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  . Retinitis   . Sinus bradycardia    a. HR 30s-40s in 04/2017 -> diltiazem stopped, metoprolol reduced.  . Sleep apnea    "chose not to order CPAP at this time" (05/18/2017)  . Type 2 diabetes mellitus ([place])   . Wears glasses   . Wheezing    a. suspected COPD 04/2017. Former tobacco x 40 years.    Medications:  Infusions:  . sodium chloride 100 mL/hr at 02/05/20 1629  . argatroban 0.9 mcg/kg/min (02/05/20 1748)  . cefTRIAXone (ROCEPHIN)  IV 2 g (02/04/20 2154)  . vancomycin 1,250 mg (02/04/20 2308)    Assessment: 86 yo male on chronic apixaban for afib was admitted with foot infection, likely need for amputation; apixaban was placed on hold.  Pt has history of HIT, so pharmacy asked to begin  anticoagulation with IV argatroban.  Last dose of apixaban was taken at 9 AM on 11/7.  Argatroban resumed this morning 11/10 s/p right transmetatarsal amputation at 0600 per MD.   Aptt tonight is within goal range at 57s on 0.75mcg/kg/min. No infusion or bleeding issues noted.   Goal of Therapy:  aPTT 50-90 seconds Monitor platelets by anticoagulation protocol: Yes   Plan:  Continue argatroban infusion at 0.90 mcg/kg/min (weight 82.6 kg) Monitor daily aPTT, CBC/plt Monitor for signs/symptoms of bleeding  F/U restart apixaban as able  Erin Hearing PharmD., BCPS Clinical Pharmacist 02/05/2020 6:29 PM

## 2020-02-05 NOTE — Progress Notes (Signed)
Pharmacy Antibiotic Note  Mike Wright is a 86 y.o. male admitted on 02/02/2020 with foot infection. Pharmacy has been consulted for vancomycin dosing. Also on ceftriaxone.  Patient now s/p right transmetatarsal amputation on 11/9. Per vascular surgery note, continue antibiotics. Intraoperative cultures drawn.  WBC wnl and stable. Afebrile. CrCl improved slightly from 40 mL/min to 46 mL min. Will order vancomycin trough prior to next dose tonight and adjust as needed.  Plan: Continue Vancomycin 1250 mg IV every 24 hours Goal trough 10-15 mcg/mL Vancomycin trough tonight prior to next dose F/u intraop cultures  Height: 5\' 7"  (170.2 cm) Weight: 83.3 kg (183 lb 10.3 oz) IBW/kg (Calculated) : 66.1  Temp (24hrs), Avg:97.6 F (36.4 C), Min:97.4 F (36.3 C), Max:97.9 F (36.6 C)  Recent Labs  Lab 02/02/20 1603 02/03/20 0823 02/04/20 0248 02/05/20 0110  WBC 9.5 7.0 6.9 6.8  CREATININE 1.40* 1.23 1.24 1.23    Estimated Creatinine Clearance: 46.2 mL/min (by C-G formula based on SCr of 1.23 mg/dL).    Allergies  Allergen Reactions   Bactrim [Sulfamethoxazole-Trimethoprim] Other (See Comments)    Pancytopenia   Codeine Shortness Of Breath   Doxycycline Swelling   Feraheme [Ferumoxytol] Other (See Comments)    Diaphoretic, chest pain   Heparin Other (See Comments)    +HIT,  Severe bleeding (with heparin drip & large doses), tolerates low doses   Losartan Swelling   Oxycodone Other (See Comments)    "Made me act out of my mind" Mental status changes- hallucinations   Latex Rash    Antimicrobials this admission: Vanc 11/7 >> Ceftriaxone 11/7 >>   Dose adjustments this admission: none  Microbiology results: 11/9 Wound Cx: gram pos cocci  11/8 Bcx (post abx): ngtd 7/1 MRSA pos   Thank you for allowing pharmacy to be a part of this patients care.  Fara Olden, PharmD PGY-1 Pharmacy Resident 02/05/2020 10:02 AM Please see AMION for all pharmacy numbers

## 2020-02-05 NOTE — Progress Notes (Addendum)
ANTICOAGULATION CONSULT NOTE - Follow Up Consult  Pharmacy Consult for Argatroban Indication: atrial fibrillation, hx HIT  Allergies  Allergen Reactions  . Bactrim [Sulfamethoxazole-Trimethoprim] Other (See Comments)    Pancytopenia  . Codeine Shortness Of Breath  . Doxycycline Swelling  . Feraheme [Ferumoxytol] Other (See Comments)    Diaphoretic, chest pain  . Heparin Other (See Comments)    +HIT,  Severe bleeding (with heparin drip & large doses), tolerates low doses  . Losartan Swelling  . Oxycodone Other (See Comments)    "Made me act out of my mind" Mental status changes- hallucinations  . Latex Rash    Patient Measurements: Height: 5\' 7"  (170.2 cm) Weight: 83.3 kg (183 lb 10.3 oz) IBW/kg (Calculated) : 66.1  Vital Signs: Temp: 97.6 F (36.4 C) (11/10 0744) Temp Source: Oral (11/10 0744) BP: 135/61 (11/10 0744) Pulse Rate: 63 (11/10 0744)  Labs: Recent Labs    02/03/20 0823 02/03/20 0823 02/03/20 1400 02/03/20 1850 02/04/20 0248 02/05/20 0110  HGB 8.2*   < >  --   --  8.5* 8.2*  HCT 27.7*  --   --   --  28.5* 27.2*  PLT 256  --   --   --  265 245  APTT 93*   < > 59* 67* 64*  --   CREATININE 1.23  --   --   --  1.24 1.23   < > = values in this interval not displayed.    Estimated Creatinine Clearance: 46.2 mL/min (by C-G formula based on SCr of 1.23 mg/dL).   Medical History: Past Medical History:  Diagnosis Date  . Anemia    a. mild, noted 04/2017.  [name] CAD in native artery    a. [name] 04/2017 s/p DES to D1, DES to prox-mid LAD, DES to prox LAD overlapping the prior stent, LVEF 55-65%.   . Chronic diastolic CHF (congestive heart failure) ([place])   . Constipation   . COPD (chronic obstructive pulmonary disease) ([place])   . Diabetic ulcer of toe ([place])   . DJD (degenerative joint disease) of cervical spine   . Dysrhythmia    AFib  . Essential hypertension   . GERD (gastroesophageal reflux disease)   . History of hiatal hernia   . HIT (heparin-induced  thrombocytopenia) ([place])   . Hypothyroidism   . Hypoxia    a. went home on home O2 04/2017.  [name] Insomnia   . Mixed hyperlipidemia   . PAD (peripheral artery disease) ([place])   . PAF (paroxysmal atrial fibrillation) ([place])   . PVD (peripheral vascular disease) ([place])   . Renal insufficiency   . 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  . Retinitis   . Sinus bradycardia    a. HR 30s-40s in 04/2017 -> diltiazem stopped, metoprolol reduced.  . Sleep apnea    "chose not to order CPAP at this time" (05/18/2017)  . Type 2 diabetes mellitus ([place])   . Wears glasses   . Wheezing    a. suspected COPD 04/2017. Former tobacco x 40 years.    Medications:  Infusions:  . sodium chloride 100 mL/hr at 02/05/20 0653  . argatroban 0.85 mcg/kg/min (02/05/20 0708)  . cefTRIAXone (ROCEPHIN)  IV 2 g (02/04/20 2154)  . vancomycin 1,250 mg (02/04/20 2308)    Assessment: 86 yo male on chronic apixaban for afib was admitted with foot infection, likely need for amputation; apixaban was placed on hold.  Pt has history of HIT, so pharmacy asked to  begin anticoagulation with IV argatroban.  Last dose of apixaban was taken at 9 AM on 11/7.  Argatroban resumed this morning 11/10 s/p right transmetatarsal amputation at 0600 per MD.   Today's 6 hour aPTT is subtherapeutic at 38 sec on argatroban infusion 0.85 mcg/kg/min. Previously was supratherapeutic on 1 mcg/kg/min, then therapeutic for three levels on 0.85 mcg/kg/min prior to surgery. Argatroban may not yet be at steady state after restarting infusion, will increase infusion rate moderately and recheck an aPTT level.  H/H, platelets stable. No s/sx bleeding reported.  Goal of Therapy:  aPTT 50-90 seconds Monitor platelets by anticoagulation protocol: Yes   Plan:  Increase argatroban infusion to 0.90 mcg/kg/min (weight 82.6 kg) 4 hour aPTT at 1730 Monitor daily aPTT, CBC/plt Monitor for signs/symptoms of bleeding  F/U restart apixaban as able  Fara Olden,  PharmD PGY-1 Pharmacy Resident 02/05/2020 9:46 AM Please see AMION for all pharmacy numbers

## 2020-02-05 NOTE — Progress Notes (Signed)
Mobility Specialist - Progress Note   02/05/20 1436  Mobility  Activity Transferred:  Bed to chair  Level of Assistance Minimal assist, patient does 75% or more  Assistive Device Front wheel walker  Mobility Response Tolerated well  Mobility performed by Mobility specialist  $Mobility charge 1 Mobility     Pre-mobility: 61 HR, 116/61 BP, 98% SpO2 Post-mobility: 65 HR, 116/51 BP, 100% SpO2  Pt able to pivot from bed to chair w/ only verbal cues and contact guard for safety. Pt happy to be out of bed.   Pricilla Handler Mobility Specialist Mobility Specialist Phone: [phone]

## 2020-02-05 NOTE — Progress Notes (Addendum)
Family Medicine Teaching Service Daily Progress Note Intern Pager: [phone]  Patient name: Mike Wright Medical record number: [id number] Date of birth: Mar 02, 1934 Age: 86 y.o. Gender: male  Primary Care Provider: Monico Blitz, MD Consultants:Vascular Code Status:DNR  Pt Overview and Major Events to Date: Admission 02/02/2020 Angio and Transmetatarsal amputation 02/04/2020 Assessment and Plan: Mike Wright a 86 y.o.malepresenting with right foot wound. 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 Right foot Infection, concerning for osteomyelitis S/P Day 1. Pt had Angio and Transmetatarsal amputation yesterday.  Appreciate Vascular surgery management. Today Pt is afebrile. Pt's vitals stable. Pt denies any pain. MRSA nasopharyngeal swab PCR is positive. Wound culture shows gram positive cocci and no wbc's. Culture pending. Positive MRSA PCR nasopharyngeal swab -Vascular surgery, Appreciate management. Recommended to continue A/B's as if now. -Tylenol for pain -ContinueIV ceftriaxone 2 g -ContinueIV vancomycin  -f/u blood culture -monitor vitals  -up with assistance -PT/OT  Hyponatremia Today Na is 133. Corrected Na is 135. -Monitor CMP  Hypertension RecentBlood pressure on135/61 mmHg. BP'shave been stable. -continue home lisinopril  -continue home lasix80 mg BID -monitor BP.  Type 2 diabetes Most recent HbA1c 6.7%(02/03/20) andCBG today is 413.Repeat CBG is 240. Also with peripheral neuropathy and blindness of left eye. His home dose include Lantus 30/40. -Continue Sensitive scale insulin. -Increase Lantusto 20 units bid.  -monitor CBGs  -f/u ophthalmologist  outpatient  CAD PAD Pt denies any SOB today. -continue home lasix daily.  Microcytic anemia Chronic.Most recent Hb is 8.2 And MCV of 75.3,likely secondary to anemia of chronic disease. Has to get blood transfusions about every 2 months coordinated by his PCP in [place].Takes folic acid at home, currently not on iron supplement at home. -MonitorHgb with daily CBCs, transfusion threshold <8 -Drawlabs with pediatric tubes to minimize blood loss -f/u with PCP, consider starting iron supplementation outpatient  CKD 3a Chronic.GFR today is  58, Cr 1.23  -Avoid nephrotoxic meds   History OSA (2018) As patient's oxygenation was well maintained during his sleep study he preferred not to go on CPAP.However he is on 2L [name] at nightat home, Pt's saturation between 100%. Pt doesn't need O 2 at night. -Do not give O2 at night.  HLD Last lipid panel December 2020 normal except for HDL low at 16. ASCVD risk 45.8%. -Continue Atorvastatin 80mg   COPD -continue duoneb -monitor respiratory status -consider outpatient PFTs   Chronic dizziness Orthostatic hypotension  This morning he denied any dizziness. Current BP- 146/61 mmHG -hold home meclizine  -consider orthostatics if needed -continue to monitor  Paroxysmal Atrial fibrillation Current HR-87/min.EKG on admission notable for atrial fibrillation.Home meds include eliquis 5 mg bid. 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  -hold home eliquis -Continue argatroban.  GERD Home med includes protonix 40 mg bid. -continue home med.  FEN/GI: Prophylaxis:Argatroban (pt with hx of HIT)  Disposition:Progressive   Subjective: Pt is sitting comfortable in the bed. Pt denies any pain. Pt denies any other complaints. Pt states he felt like a tornado after the anesthesia and doesn't want to take same anesthesia again. Pt has been tolerating food.    Objective: Temp:  [97.4 F (36.3  C)-98.5 F (36.9 C)] 98.5 F (36.9 C) (11/10 1110) Pulse Rate:  [0-92] 59 (11/10 1110) Resp:  [  7-45] 19 (11/10 1110) BP: (109-163)/(53-92) 128/67 (11/10 1110) SpO2:  [92 %-100 %] 100 % (11/10 1110) Physical Exam: General:Patient laying comfortably in bed, in noacute distress. Cardiovascular, RRR, Systolic murmur noted. Respiratory: Breath sounds clear. No rales, rhonchi and wheezes. Abdomen:soft, mild tenderness in the epigastric region, presence of active bowel sounds Extremities:Rt foot in Dressing so cannot examine. No abnormal findings noted on the left foot.No Edema Neuro: No foal deficit. Approappropriately conversational Psych: Mood- Euthymic. Affect- appropriate. Laboratory: Recent Labs  Lab 02/03/20 0823 02/04/20 0248 02/05/20 0110  WBC 7.0 6.9 6.8  HGB 8.2* 8.5* 8.2*  HCT 27.7* 28.5* 27.2*  PLT 256 265 245   Recent Labs  Lab 02/03/20 0823 02/04/20 0248 02/05/20 0110  NA 137 138 133*  K 3.3* 4.1 4.2  CL 100 101 100  CO2 25 26 20*  BUN 15 17 21   CREATININE 1.23 1.24 1.23  CALCIUM 8.4* 8.6* 8.0*  PROT 6.1* 6.0* 6.0*  BILITOT 0.6 0.8 0.7  ALKPHOS 59 60 62  ALT 13 17 14   AST 19 22 17   GLUCOSE 147* 165* 413*      Imaging/Diagnostic Tests: No new results.  Armando Reichert, MD 02/05/2020, 11:15 AM PGY-1, [place] Intern pager: [phone], text pages welcome

## 2020-02-05 NOTE — Op Note (Signed)
    Patient name: KOLTAN PORTOCARRERO MRN: [id number] DOB: Jan 23, 1934 Sex: male  02/04/2020 Pre-operative Diagnosis: Draining right forefoot infection Post-operative diagnosis:  Same Surgeon:  [name] Assistants: Marlinda Mike Procedure:   Right foot transmetatarsal amputation Anesthesia: MAC and regional Blood Loss: Minimal Specimens: Right forefoot.  Aerobic and anaerobic cultures  Findings: At the level of amputatio site, the bone was healthy and there was good capillary bleeding.  Indications: The patient has previously undergone percutaneous revascularization as well as multiple digital amputations.  He recently developed drainage from the right great toe amputation site.  He went for angiography earlier today and had adequate blood flow.  I therefore recommended converting his foot to a formal transmetatarsal amputation  Procedure:  The patient was identified in the holding area and taken to [place] 11  The patient was then placed supine on the table. regional anesthesia was administered.  The patient was prepped and draped in the usual sterile fashion.  A time out was called and antibiotics were administered.  A fishmouth incision was made in the mid foot with a 10 blade.  The incision was carried down to the bone.  Periosteal elevator was used to elevate the periosteum.  A reciprocating saw was used to divide the bone which was all healthy.  Specimen was removed.  I did send anaerobic and aerobic cultures.  A rasp was used to smooth the bone surface edges.  Wound was then copiously irrigated.  Hemostasis was achieved.  There was excellent capillary bleeding and some pulsatile bleeding from digital arteries.  Once hemostasis was achieved, because the wound bed looked so healthy I elected to partially close this with several interrupted 2-0 nylon sutures.  A sterile dressing was applied.  There are no immediate complications.   Disposition: To PACU stable.   Theotis Burrow, M.D.,  [facility] [place] Office: [phone] Pager:  [phone]

## 2020-02-06 ENCOUNTER — Ambulatory Visit: Payer: Self-pay

## 2020-02-06 DIAGNOSIS — I10 Essential (primary) hypertension: Secondary | ICD-10-CM | POA: Diagnosis not present

## 2020-02-06 DIAGNOSIS — L03115 Cellulitis of right lower limb: Secondary | ICD-10-CM | POA: Diagnosis not present

## 2020-02-06 LAB — COMPREHENSIVE METABOLIC PANEL
ALT: 23 U/L (ref 0–44)
AST: 28 U/L (ref 15–41)
Albumin: 2.7 g/dL — ABNORMAL LOW (ref 3.5–5.0)
Alkaline Phosphatase: 56 U/L (ref 38–126)
Anion gap: 11 (ref 5–15)
BUN: 24 mg/dL — ABNORMAL HIGH (ref 8–23)
CO2: 22 mmol/L (ref 22–32)
Calcium: 7.9 mg/dL — ABNORMAL LOW (ref 8.9–10.3)
Chloride: 106 mmol/L (ref 98–111)
Creatinine, Ser: 1.5 mg/dL — ABNORMAL HIGH (ref 0.61–1.24)
GFR, Estimated: 46 mL/min — ABNORMAL LOW (ref >60)
Glucose, Bld: 174 mg/dL — ABNORMAL HIGH (ref 70–99)
Potassium: 3.5 mmol/L (ref 3.5–5.1)
Sodium: 139 mmol/L (ref 135–145)
Total Bilirubin: 0.5 mg/dL (ref 0.3–1.2)
Total Protein: 5.4 g/dL — ABNORMAL LOW (ref 6.5–8.1)

## 2020-02-06 LAB — CBC
HCT: 24.9 % — ABNORMAL LOW (ref 39.0–52.0)
HCT: 29.8 % — ABNORMAL LOW (ref 39.0–52.0)
Hemoglobin: 7.5 g/dL — ABNORMAL LOW (ref 13.0–17.0)
Hemoglobin: 8.9 g/dL — ABNORMAL LOW (ref 13.0–17.0)
MCH: 22.4 pg — ABNORMAL LOW (ref 26.0–34.0)
MCH: 22.5 pg — ABNORMAL LOW (ref 26.0–34.0)
MCHC: 30.1 g/dL (ref 30.0–36.0)
MCHC: 29.9 g/dL — ABNORMAL LOW (ref 30.0–36.0)
MCV: 74.3 fL — ABNORMAL LOW (ref 80.0–100.0)
MCV: 75.3 fL — ABNORMAL LOW (ref 80.0–100.0)
Platelets: 258 10*3/uL (ref 150–400)
Platelets: 309 10*3/uL (ref 150–400)
RBC: 3.35 MIL/uL — ABNORMAL LOW (ref 4.22–5.81)
RBC: 3.96 MIL/uL — ABNORMAL LOW (ref 4.22–5.81)
RDW: 24 % — ABNORMAL HIGH (ref 11.5–15.5)
RDW: 23.5 % — ABNORMAL HIGH (ref 11.5–15.5)
WBC: 7.8 10*3/uL (ref 4.0–10.5)
WBC: 7.9 10*3/uL (ref 4.0–10.5)
nRBC: 0 % (ref 0.0–0.2)
nRBC: 0 % (ref 0.0–0.2)

## 2020-02-06 LAB — GLUCOSE, CAPILLARY
Glucose-Capillary: 152 mg/dL — ABNORMAL HIGH (ref 70–99)
Glucose-Capillary: 192 mg/dL — ABNORMAL HIGH (ref 70–99)
Glucose-Capillary: 271 mg/dL — ABNORMAL HIGH (ref 70–99)
Glucose-Capillary: 173 mg/dL — ABNORMAL HIGH (ref 70–99)

## 2020-02-06 LAB — HEMOGLOBIN AND HEMATOCRIT, BLOOD
HCT: 25.2 % — ABNORMAL LOW (ref 39.0–52.0)
Hemoglobin: 7.5 g/dL — ABNORMAL LOW (ref 13.0–17.0)

## 2020-02-06 LAB — APTT: aPTT: 61 seconds — ABNORMAL HIGH (ref 24–36)

## 2020-02-06 LAB — PREPARE RBC (CROSSMATCH)

## 2020-02-06 MED ORDER — SODIUM CHLORIDE 0.9% IV SOLUTION: Freq: Once | INTRAVENOUS | Status: AC

## 2020-02-06 MED ORDER — POTASSIUM CHLORIDE CRYS ER 20 MEQ PO TBCR
20.0000 meq | EXTENDED_RELEASE_TABLET | Freq: Every day | ORAL | Status: DC
  Administered 2020-02-06 – 2020-02-07 (×2): 20 meq via ORAL
  Filled 2020-02-06 (×2): qty 1

## 2020-02-06 MED ORDER — APIXABAN 5 MG PO TABS: 5.0000 mg | ORAL_TABLET | Freq: Two times a day (BID) | ORAL | Status: DC

## 2020-02-06 MED ORDER — APIXABAN 5 MG PO TABS
5.0000 mg | ORAL_TABLET | Freq: Two times a day (BID) | ORAL | Status: DC
  Administered 2020-02-06 – 2020-02-07 (×3): 5 mg via ORAL
  Filled 2020-02-06 (×3): qty 1

## 2020-02-06 MED ORDER — LINEZOLID 600 MG PO TABS
600.0000 mg | ORAL_TABLET | Freq: Two times a day (BID) | ORAL | Status: DC
  Administered 2020-02-06 – 2020-02-07 (×2): 600 mg via ORAL
  Filled 2020-02-06 (×3): qty 1

## 2020-02-06 NOTE — Care Management Important Message (Signed)
Important Message  Patient Details  Name: Mike Wright MRN: [id number] Date of Birth: 1933-03-30   Medicare Important Message Given:  Yes     Shelda Altes 02/06/2020, 11:32 AM

## 2020-02-06 NOTE — Progress Notes (Addendum)
ANTICOAGULATION CONSULT NOTE - Follow Up Consult  Pharmacy Consult for Argatroban Indication: atrial fibrillation, hx HIT  Allergies  Allergen Reactions  . Bactrim [Sulfamethoxazole-Trimethoprim] Other (See Comments)    Pancytopenia  . Codeine Shortness Of Breath  . Doxycycline Swelling    Swelling and numbness in lips and face. Swelling improved after stopping. Reports still experiences numbness in bottom lip.   Shirlean Kelly [Ferumoxytol] Other (See Comments)    Diaphoretic, chest pain  . Heparin Other (See Comments)    +HIT,  Severe bleeding (with heparin drip & large doses), tolerates low doses  . Losartan Swelling  . Oxycodone Other (See Comments)    "Made me act out of my mind" Mental status changes- hallucinations  . Latex Rash    Patient Measurements: Height: 5\' 7"  (170.2 cm) Weight: 83.4 kg (183 lb 13.8 oz) IBW/kg (Calculated) : 66.1  Vital Signs: Temp: 97.8 F (36.6 C) (11/11 0735) Temp Source: Oral (11/11 0735) BP: 149/79 (11/11 0735) Pulse Rate: 71 (11/11 0735)  Labs: Recent Labs    02/04/20 0248 02/04/20 0248 02/05/20 0110 02/05/20 0110 02/05/20 1158 02/05/20 1746 02/06/20 0140 02/06/20 0530  HGB 8.5*   < > 8.2*   < >  --   --  7.5* 7.5*  HCT 28.5*   < > 27.2*  --   --   --  24.9* 25.2*  PLT 265  --  245  --   --   --  258  --   APTT 64*   < >  --   --  38* 57* 61*  --   CREATININE 1.24  --  1.23  --   --   --  1.50*  --    < > = values in this interval not displayed.    Estimated Creatinine Clearance: 37.9 mL/min (A) (by C-G formula based on SCr of 1.5 mg/dL (H)).   Medical History: Past Medical History:  Diagnosis Date  . Anemia    a. mild, noted 04/2017.  [name] CAD in native artery    a. [name] 04/2017 s/p DES to D1, DES to prox-mid LAD, DES to prox LAD overlapping the prior stent, LVEF 55-65%.   . Chronic diastolic CHF (congestive heart failure) ([place])   . Constipation   . COPD (chronic obstructive pulmonary disease) ([place])   . Diabetic ulcer of  toe ([place])   . DJD (degenerative joint disease) of cervical spine   . Dysrhythmia    AFib  . Essential hypertension   . GERD (gastroesophageal reflux disease)   . History of hiatal hernia   . HIT (heparin-induced thrombocytopenia) ([place])   . Hypothyroidism   . Hypoxia    a. went home on home O2 04/2017.  [name] Insomnia   . Mixed hyperlipidemia   . PAD (peripheral artery disease) ([place])   . PAF (paroxysmal atrial fibrillation) ([place])   . PVD (peripheral vascular disease) ([place])   . Renal insufficiency   . 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  . Retinitis   . Sinus bradycardia    a. HR 30s-40s in 04/2017 -> diltiazem stopped, metoprolol reduced.  . Sleep apnea    "chose not to order CPAP at this time" (05/18/2017)  . Type 2 diabetes mellitus ([place])   . Wears glasses   . Wheezing    a. suspected COPD 04/2017. Former tobacco x 40 years.    Medications:  Infusions:  . sodium chloride 100 mL/hr at 02/06/20 0324  . argatroban 0.9  mcg/kg/min (02/06/20 0322)  . cefTRIAXone (ROCEPHIN)  IV 2 g (02/05/20 2117)  . vancomycin 1,250 mg (02/05/20 2251)    Assessment: 86 yo male on chronic apixaban for afib was admitted with foot infection, likely need for amputation; apixaban was placed on hold.  Pt has history of HIT, so pharmacy asked to begin anticoagulation with IV argatroban.  Last dose of apixaban was taken at 9 AM on 11/7.  Argatroban resumed 11/10 s/p right transmetatarsal amputation.  Today's 6 hour aPTT is therapeutic at 61 seconds. Patient is bleeding through new wound bandages this morning, reports bleeding has not increased from yesterday.   Hgb decreased from 8.2 to 7.5, transfusion threshold of < 8, receiving 1 unit PRBC today. Patient reports he receives 2 units PRBCs every ~2 months for chronically low Hgb. Reports he is unable to tolerate iron infusions (history of anaphylaxis) or oral iron. Platelets are stable.  Goal of Therapy:  aPTT 50-90 seconds Monitor platelets by  anticoagulation protocol: Yes   Plan:  Continue argatroban infusion at 0.90 mcg/kg/min (weight 82.6 kg) Monitor daily aPTT, CBC/plt Monitor signs/symptoms of bleeding  F/U restart apixaban as able  Fara Olden, PharmD PGY-1 Pharmacy Resident 02/06/2020 9:01 AM Please see AMION for all pharmacy numbers

## 2020-02-06 NOTE — Evaluation (Signed)
Physical Therapy Evaluation Patient Details Name: Mike Wright MRN: [id number] DOB: May 30, 1933 Today's Date: 02/06/2020   History of Present Illness  86 y.o. male presenting with infected right foot wound. 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 Admitted 02/02/20 for treatment of R foot. s/p 02/05/20 R foot transmetatarsal amputation.   Clinical Impression  PTA pt living with wife in single story home with 5 steps to enter. Pt reports independence in ambulation with SPC until last couple days prior to hospitalization when he was using RW. Pt was independent in ADLs and iADLs. Pt had just had bandage removed by MD to visualize surgical site and pt with bleed through on dressing, so limited mobility evaluation possible. Pt is supervision for bed mobility. Pt will not d/c until tomorrow and PT will follow back for stair training. PT recommending HHPT at discharge to regain PLOF. Pt has used [facility] in the past.     Follow Up Recommendations Home health PT;Supervision for mobility/OOB    Equipment Recommendations  None recommended by PT (has everything he needs)       Precautions / Restrictions Precautions Precautions: None Restrictions Weight Bearing Restrictions: No      Mobility  Bed Mobility Overal bed mobility: Needs Assistance Bed Mobility: Supine to Sit;Sit to Supine     Supine to sit: Supervision Sit to supine: Supervision   General bed mobility comments: HoB elevated and minor use of bedrail to come to EoB, pt with minor dizziness which dissipated quickly, BP in line with prior measurement    Transfers                 General transfer comment: deferred due to increased bleed through of  surgical site and impending blood transfusion                Pertinent Vitals/Pain Pain Assessment: 0-10 Pain Score: 5  Pain Location: L foot Pain Descriptors / Indicators: Aching;Throbbing Pain Intervention(s): Limited activity within patient's tolerance;Monitored during session;Repositioned    Home Living Family/patient expects to be discharged to:: Private residence Living Arrangements: Spouse/significant other Available Help at Discharge: Family;Available 24 hours/day Type of Home: House Home Access: Stairs to enter Entrance Stairs-Rails: Can reach both;Left;Right Entrance Stairs-Number of Steps: 5 Home Layout: One level Home Equipment: Hand held shower head;Grab bars - tub/shower;Grab bars - toilet;Bedside commode;Walker - 4 wheels;Cane - single point;Shower seat - built in;Transport chair;Wheelchair - manual      Prior Function Level of Independence: Independent with assistive device(s)         Comments: used SPC up until the last 2-3 days before hospitalization where he used RW      Hand Dominance   Dominant Hand: Right    Extremity/Trunk Assessment   Upper Extremity Assessment Upper Extremity Assessment: Defer to OT evaluation    Lower Extremity Assessment Lower Extremity Assessment: RLE deficits/detail;LLE deficits/detail RLE Deficits / Details: R transmetatarsal amputation, s/p R TKA lacking full extension, Hip ROM WFL, strength grossly 3+/5 LLE Deficits / Details: L TKA lacking full extension, hip and ankle WFL, strength grossly 3=/5       Communication   Communication: No difficulties  Cognition Arousal/Alertness: Awake/alert Behavior During Therapy: WFL for tasks assessed/performed Overall Cognitive Status: Within Functional Limits for tasks  assessed                                        General Comments General comments (skin integrity, edema, etc.): Pt on 3L O2 via [place] with SaO2 98%O2, reports he only uses supplemental O2  at night at home, removed [place] and pt able to maintain SaO2 >92%O2, MD had just unwrapped bandage to visualize surgical site and pt experiencing increased bleed through RN notified,         Assessment/Plan    PT Assessment Patient needs continued PT services  PT Problem List Decreased strength;Decreased range of motion;Decreased activity tolerance;Decreased balance;Decreased mobility;Decreased skin integrity;Pain       PT Treatment Interventions DME instruction;Gait training;Stair training;Functional mobility training;Therapeutic activities;Therapeutic exercise;Balance training;Cognitive remediation;Patient/family education    PT Goals (Current goals can be found in the Care Plan section)  Acute Rehab PT Goals Patient Stated Goal: go home and get back to walking PT Goal Formulation: With patient Time For Goal Achievement: 02/20/20 Potential to Achieve Goals: Good    Frequency Min 3X/week    AM-PAC PT "6 Clicks" Mobility  Outcome Measure Help needed turning from your back to your side while in a flat bed without using bedrails?: None Help needed moving from lying on your back to sitting on the side of a flat bed without using bedrails?: None Help needed moving to and from a bed to a chair (including a wheelchair)?: A Little Help needed standing up from a chair using your arms (e.g., wheelchair or bedside chair)?: A Little Help needed to walk in hospital room?: A Little Help needed climbing 3-5 steps with a railing? : A Little 6 Click Score: 20    End of Session   Activity Tolerance: Patient tolerated treatment well Patient left: in bed;with call bell/phone within reach Nurse Communication: Mobility status;Other (comment) (bleed through on dressing) PT Visit Diagnosis: Other abnormalities of gait and mobility (R26.89);Difficulty in walking, not elsewhere classified (R26.2);Pain;Unsteadiness on feet (R26.81) Pain - Right/Left: Right Pain - part of body: Ankle and joints of foot     Time: 5573-2202 PT Time Calculation (min) (ACUTE ONLY): 25 min   Charges:   PT Evaluation $PT Eval Moderate Complexity: 1 Mod PT Treatments $Therapeutic Activity: 8-22 mins        Kalenna Millett B. Migdalia Dk PT, DPT Acute Rehabilitation Services Pager [phone] Office [phone]   [place] 02/06/2020, 9:46 AM

## 2020-02-06 NOTE — Progress Notes (Signed)
Mobility Specialist - Progress Note   02/06/20 1510  Mobility  Activity Refused mobility   Pt not wanting to stand due to bleeding in his R foot, he recently had OT as well.   Pricilla Handler Mobility Specialist Mobility Specialist Phone: [phone]

## 2020-02-06 NOTE — Progress Notes (Signed)
Interim progress note  Noted patient CBC this morning showing hemoglobin of 7.5.  Repeat hemoglobin was the same at 7.5.  As patient has a history of coronary artery disease his transfusion threshold is less than 8.  Discussed the situation with our attending Dr. Gwendlyn Deutscher, who agreed that a transfusion of 1 unit PRBC is in order due to the patient's past medical history.  Contacted patient's nurse Melanie and discussed the plan with her.  We will perform type and screen, transfuse 1 unit PRBC and will look for repeat posttransfusion H&H afterwards.

## 2020-02-06 NOTE — Progress Notes (Signed)
Pharmacy Antibiotic Note  Mike Wright is a 86 y.o. male admitted on 02/02/2020 with foot infection. Pharmacy has been consulted for vancomycin dosing. Also on ceftriaxone.  Patient now s/p right transmetatarsal amputation on 11/9. Per vascular surgery patient will need 2 weeks of antibiotics and is stable for discharge. Intraoperative wound culture growing gram positive cocci, MRSA positive PCR.  Vancomycin trough drawn prior to last dose was therapeutic at 13.  WBC wnl and stable. Afebrile. Scr increased from 1.23 yesterday to 1.5 today. CrCl  is ~38 mL/min.   Spoke with patient regarding doxycyline allergy. Patient reports he experienced swelling and numbness in his lips and face, denies trouble breathing, hives, swelling in throat. Symptoms slowly resolved after discontinuing doxycyline. Confirmed reaction occurred in ~ July 2021 per chart review.  Plan: Continue Vancomycin 1250 mg IV every 24 hours Goal trough 10-15 mcg/mL F/u intraop culture F/u narrowing abx and transition to PO  Height: 5\' 7"  (170.2 cm) Weight: 83.4 kg (183 lb 13.8 oz) IBW/kg (Calculated) : 66.1  Temp (24hrs), Avg:97.8 F (36.6 C), Min:97.5 F (36.4 C), Max:98.5 F (36.9 C)  Recent Labs  Lab 02/02/20 1603 02/03/20 0823 02/04/20 0248 02/05/20 0110 02/05/20 2150 02/06/20 0140  WBC 9.5 7.0 6.9 6.8  --  7.8  CREATININE 1.40* 1.23 1.24 1.23  --  1.50*  VANCOTROUGH  --   --   --   --  13*  --     Estimated Creatinine Clearance: 37.9 mL/min (A) (by C-G formula based on SCr of 1.5 mg/dL (H)).    Allergies  Allergen Reactions  . Bactrim [Sulfamethoxazole-Trimethoprim] Other (See Comments)    Pancytopenia  . Codeine Shortness Of Breath  . Doxycycline Swelling    Swelling and numbness in lips and face. Swelling improved after stopping. Reports still experiences numbness in bottom lip.   Shirlean Kelly [Ferumoxytol] Other (See Comments)    Diaphoretic, chest pain  . Heparin Other (See Comments)    +HIT,   Severe bleeding (with heparin drip & large doses), tolerates low doses  . Losartan Swelling  . Oxycodone Other (See Comments)    "Made me act out of my mind" Mental status changes- hallucinations  . Latex Rash    Antimicrobials this admission: Vanc 11/7 >> Ceftriaxone 11/7 >>   Dose adjustments this admission: none  Microbiology results: 11/9 Wound Cx: gram pos cocci  11/8 Bcx (post abx): ngtd 7/1 MRSA pos   Thank you for allowing pharmacy to be a part of this patient's care.  Fara Olden, PharmD PGY-1 Pharmacy Resident 02/06/2020 8:47 AM Please see AMION for all pharmacy numbers

## 2020-02-06 NOTE — Discharge Summary (Addendum)
[facility] Discharge Summary  Patient name: Mike Wright Medical record number: [id number] Date of birth: 07/08/1933 Age: 86 y.o. Gender: male Date of Admission: 02/02/2020  Date of Discharge: 02/07/2020 Admitting Physician: Leeanne Rio, MD  Primary Care Provider: Monico Blitz, MD Consultants: Dr Serafina Mitchell MD  Indication for Hospitalization: Infected Rt foot wound  Discharge Diagnoses/Problem List:  Rt Foot Infection S/p Rt Transmetatarsal amputation (02/04/20)  Disposition: Home  Discharge Condition: Stable   Discharge Exam:   General:  Patient laying comfortably in bed, in no acute distress.  Cardiovascular, RRR, Systolic murmur noted.  Respiratory: Breath sounds clear. No rales, rhonchi and wheezes. Abdomen: soft, Non tender, presence of active bowel sounds Extremities: Rt foot in Dressing, No Bleeding. No abnormal findings noted on the left foot. No Edema Neuro: No foal deficit. Approappropriately conversational  Psych: Mood- Euthymic. Affect- appropriate  Brief Hospital Course:  KYRESE GARTMAN is a 86 y.o. male presenting with infected right foot wound. 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   Right foot Infection, concerning for osteomyelitis Patient presenting with a new foot wound on his right distal foot. Presented afebrile with stable vitals, reports noting erythema and purulent drainage from affected site along the dorsum portion for 1 week. 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 In the ED, he was given dose of ciprofloxacin due to concern for cellulitis. Pt with wbc of 9.5, XR notable possible cortical irregularity at the  fifth metatarsal head and remaining portion of the third proximal phalanx with the overlying soft tissue swelling and subcutaneous emphysema, possibly showing signs of osteomyelitis.  Pt was started on IV ceftriaxone vancomycin. Vascular surgery consulted, recommended Duplex USG of right lower extremity and ABI and Angio (see below for results). Ultimately, Rt Transmetatarsal amputation was performed on 02/04/2020. Pt tolerated the p[procedure well. PT /OT was consulted and and recommendations for home PT. At admission, patient switched to argatroban (hx of HIT) and restarted on home DOAC prior to discharge.   Anemia Hx of scheduled blood transfusions  Hb of 7.5 on 02/06/2020. Pt was transfused 1 unit of PRBC. Repeat CBC showed Hb of 8.9.   Issues for Follow Up:  1. F/U with Vascular Surgeon on 11/29 2. F/U with PCP for further work up and management of acute on chronic microcytic anemia   Significant Procedures:  Abdominal Aortogram w/lower Extrimity (02/04/2020) Rt Transmetatarsal amputation (02/04/20)   Significant Labs and Imaging:  Recent Labs  Lab 02/06/20 0140 02/06/20 0140 02/06/20 0530 02/06/20 1508 02/07/20 0250  WBC 7.8  --   --  7.9 8.0  HGB 7.5*   < > 7.5* 8.9* 8.9*  HCT 24.9*   < > 25.2* 29.8* 28.2*  PLT 258  --   --  309 296   < > = values in this interval not displayed.   Recent Labs  Lab 02/03/20 0823 02/03/20 0823 02/04/20 0248 02/04/20 0248 02/05/20 0110 02/05/20 0110 02/06/20 0140 02/07/20 0250  NA 137  --  138  --  133*  --  139 140  K 3.3*   < > 4.1   < > 4.2   < > 3.5 3.6  CL 100  --  101  --  100  --  106 102  CO2 25  --  26  --  20*  --  22 24  GLUCOSE 147*  --  165*  --  413*  --  174* 102*  BUN 15  --  17  --  21  --  24* 24*  CREATININE 1.23  --  1.24  --  1.23  --  1.50* 1.25*  CALCIUM 8.4*  --  8.6*  --  8.0*  --  7.9* 8.6*  ALKPHOS 59  --  60  --  62  --  56 55  AST 19  --  22  --  17  --  28 29  ALT 13  --  17  --  14  --  23 27  ALBUMIN  3.1*  --  3.0*  --  2.8*  --  2.7* 3.0*   < > = values in this interval not displayed.      Results/Tests Pending at Time of Discharge: None  Discharge Medications:  Allergies as of 02/07/2020       Reactions   Bactrim [sulfamethoxazole-trimethoprim] Other (See Comments)   Pancytopenia   Codeine Shortness Of Breath   Doxycycline Swelling   Swelling and numbness in lips and face. Swelling improved after stopping. Reports still experiences numbness in bottom lip.    Feraheme [ferumoxytol] Other (See Comments)   Diaphoretic, chest pain   Heparin Other (See Comments)   +HIT,  Severe bleeding (with heparin drip & large doses), tolerates low doses   Losartan Swelling   Oxycodone Other (See Comments)   "Made me act out of my mind" Mental status changes- hallucinations   Latex Rash        Medication List     TAKE these medications    acetaminophen 500 MG tablet Commonly known as: TYLENOL Take 1 tablet (500 mg total) by mouth every 6 (six) hours as needed for mild pain or headache.   atorvastatin 80 MG tablet Commonly known as: LIPITOR Take 80 mg by mouth every evening.   B-12 2500 MCG Tabs Take 2,500 mcg by mouth daily.   Droplet Insulin Syringe 31G X 5/16" 1 ML Misc Generic drug: Insulin Syringe-Needle U-100   Eliquis 5 MG Tabs tablet Generic drug: apixaban Take 5 mg by mouth 2 (two) times daily.   folic acid 1 MG tablet Commonly known as: FOLVITE Take 1 tablet (1 mg total) by mouth daily.   furosemide 80 MG tablet Commonly known as: LASIX Take 80 mg by mouth See admin instructions. Take 80 mg by mouth in the morning and between 5-6 PM daily   insulin glargine 100 UNIT/ML injection Commonly known as: LANTUS Inject 30-40 Units into the skin See admin instructions. Inject 30 units into the skin in the morning and 40 units at bedtime   ipratropium-albuterol 0.5-2.5 (3) MG/3ML Soln Commonly known as: DUONEB Take 3 mLs by nebulization every evening.    isosorbide mononitrate 30 MG 24 hr tablet Commonly known as: IMDUR Take 1 tablet (30 mg total) by mouth in the morning and at bedtime.   linezolid 600 MG tablet Commonly known as: ZYVOX Take 1 tablet (600 mg total) by mouth every 12 (twelve) hours for 10 days.   lisinopril 5 MG tablet Commonly known as: ZESTRIL Take 0.5 tablets (2.5 mg total) by mouth daily.   meclizine 25 MG tablet Commonly known as: ANTIVERT Take 25 mg by mouth daily as needed for dizziness.   metFORMIN 1000 MG  tablet Commonly known as: GLUCOPHAGE Take 1,000 mg by mouth 2 (two) times daily.   nitroGLYCERIN 0.4 MG SL tablet Commonly known as: NITROSTAT PLACE 1 TABLET (0.4 MG TOTAL) UNDER THE TONGUE EVERY 5  MINUTES AS NEEDED FOR CHEST PAIN. What changed: See the new instructions.   OXYGEN Inhale 2 L/min into the lungs See admin instructions. 2 L/min of oxygen at bedtime and during the day as needed for shortness of breath   pantoprazole 40 MG tablet Commonly known as: Protonix Take 1 tablet (40 mg total) by mouth 2 (two) times daily. To protect stomach while taking multiple blood thinners.   potassium chloride SA 20 MEQ tablet Commonly known as: KLOR-CON Take 1 tablet (20 mEq total) by mouth daily.   psyllium 58.6 % packet Commonly known as: METAMUCIL Take 0.5 packets by mouth 2 (two) times daily. MIX AND DRINK   Spiriva HandiHaler 18 MCG inhalation capsule Generic drug: tiotropium Place 1 capsule into inhaler and inhale daily after breakfast.   traMADol 50 MG tablet Commonly known as: Ultram Take 1 tablet (50 mg total) by mouth every 8 (eight) hours as needed. What changed: reasons to take this   traZODone 100 MG tablet Commonly known as: DESYREL Take 100-200 mg by mouth at bedtime.   True Metrix Blood Glucose Test test strip Generic drug: glucose blood   Vitamin D3 50 MCG (2000 UT) Tabs Take 2,000 Units by mouth daily.               Discharge Care Instructions  (From admission,  onward)           Start     Ordered   02/07/20 0000  Discharge wound care:       Comments: Wife or home health to change dressings for patient   02/07/20 1412            Discharge Instructions: Please refer to Patient Instructions section of EMR for full details.  Patient was counseled important signs and symptoms that should prompt return to medical care, changes in medications, dietary instructions, activity restrictions, and follow up appointments.   Follow-Up Appointments:  Follow-up Information     Serafina Mitchell, MD Follow up in 2 week(s).   Specialties: Vascular Surgery, Cardiology Why: the office will call patient with their follow up appointment (02/24/20) Contact information: [place] [address] [phone]         Care, [facility] Follow up.   Specialty: [place] Why: HHPT arranged- they will contact you post discharge to set up visits Contact information: [place] [address] [phone]                 Armando Reichert, MD 02/07/2020, 4:20 PM PGY-1, [place] Upper-Level Resident Addendum I have discussed the above with the original author and agree with their documentation. My edits for correction/addition/clarification are included. Please see also any attending notes.   Wilber Oliphant, M.D.  PGY-3 02/07/2020 9:42 PM

## 2020-02-06 NOTE — Progress Notes (Signed)
Family Medicine Teaching Service Daily Progress Note Intern Pager: [phone]  Patient name: Mike Wright Medical record number: [id number] Date of birth: Jul 02, 1933 Age: 86 y.o. Gender: male  Primary Care Provider: Monico Blitz, MD Consultants:Vascular Code Status:DNR  Pt Overview and Major Events to Date: Admission 02/02/2020 Angio and Transmetatarsal amputation 02/04/2020 Assessment and Plan: Mike Wright Butleris a 86 y.o.malepresenting with right foot wound. 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  Right foot Infection, concerning for osteomyelitis S/P Day 2. Pt had Angio and Transmetatarsal amputation on 02/04/20.  Appreciate Vascular surgery management. Pt is doing well, He is afebrile. Pt's vitals stable. Pt tolerating orally. Pt states he has some pain. Wound cultures show GPC. Blood culture shows no growth in 2 days. Final report still pending. APTT is elevated at 61. Pt disposition delayed because of blood transfusion.   -Vascular surgery, Appreciate management. Recommended stable for discharge. Pt will need 2 weeks of A/Bs.  -Tylenol for pain -Stop IV A/B's -Stop IV fluids. Pt tolerating orally well. -Start Oral Linezolid. -monitor vitals  -up with assistance -We'll set up Outpatient PT/OT according to recommendations by PT/OT -Monitor electrolytes.  Hypertension RecentBlood pressure on119/27mmHg. BP'shave been stable. -continue home lisinopril  -continue home lasix80 mgBID -monitor BP.  Type 2 diabetes Most recent HbA1c 6.7%(02/03/20)andCBG today is 174.Also with peripheral neuropathy and blindness of left eye. -Continue Sensitive scale insulin. -Continue Lantusto 20  units bid.  -monitor CBGs   CAD PAD Pt denies any SOB today. -continue home lasix daily.  Microcytic anemia Chronic.Most recent Hb is7.5And MCV of 74.3,likely secondary to anemia of chronic disease. Has to get blood transfusions about every 2 months coordinated by his PCP in [place].Takes folic acid at home. He states he cannot tolerate oral iron pills because of gastric upset and abdominal pain. He states the he tried iron infusion in the past but coded during 2 nd infusion.  -1 unit of PRBC ordered. Pt will get transfusion today. -RepeatHgb with CBC after transfusion. -Drawlabs with pediatric tubes to minimize blood loss.   CKD 3a Chronic.GFRtoday is 46, Cr 1.50 -Avoid nephrotoxic meds  HLD Last lipid panel December 2020 normal except for HDL low at 16. ASCVD risk 45.8%. -Continue Atorvastatin 80mg   COPD -continue duoneb -monitor respiratory status -consider outpatient PFTs   Chronic dizziness Orthostatic hypotension This morning he denied any dizziness. Current BP- 119/4mmHG -hold home meclizine -consider orthostatics if needed -continue to monitor  Paroxysmal Atrial fibrillation Current HR-52 /min.EKG on admission notable for atrial fibrillation.Home meds include eliquis 5 mg bid. 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  -Start home eliquis -Stop argatroban.  GERD Home med includes protonix 40 mg bid. -continue home med.  FEN/GI:Heart Healthy Carb balanced Diet Prophylaxis:Eliquis   Disposition:Progressive Subjective: Pt is doing good. Afebrile. Vitals stable. Pt states he still has some pain. He rates his pain at 6/10. He states the VS changed the dressing. Pt denies any chest pain and shortness of breath. He states he cannot tolerate oral iron pills because of gastric upset and abdominal pain. He states the he tried iron infusion in the past but coded during 2 nd infusion. He denies any other complaints.    Objective: Temp:  [97.5 F (36.4 C)-98.5 F (36.9 C)] 97.8 F (36.6 C) (  11/11 0735) Pulse Rate:  [57-72] 71 (11/11 0735) Resp:  [19-29] 19 (11/11 0735) BP: (119-149)/(57-95) 149/79 (11/11 0735) SpO2:  [98 %-100 %] 98 % (11/11 0735) Weight:  [83.4 kg] 83.4 kg (11/11 0402) Physical Exam: General:Patient laying comfortably in bed, in noacute distress. Cardiovascular, RRR,Systolic murmur noted. Respiratory: Breath sounds clear. No rales, rhonchi and wheezes. Abdomen:soft, mild tenderness in the epigastric region, presence of active bowel sounds Extremities:Rt foot in Dressing, Bleeding present. No abnormal findings noted on the left foot.No Edema Neuro: No foal deficit. Approappropriately conversational Psych: Mood- Euthymic. Affect- appropriate  Laboratory: Recent Labs  Lab 02/04/20 0248 02/04/20 0248 02/05/20 0110 02/06/20 0140 02/06/20 0530  WBC 6.9  --  6.8 7.8  --   HGB 8.5*   < > 8.2* 7.5* 7.5*  HCT 28.5*   < > 27.2* 24.9* 25.2*  PLT 265  --  245 258  --    < > = values in this interval not displayed.   Recent Labs  Lab 02/04/20 0248 02/05/20 0110 02/06/20 0140  NA 138 133* 139  K 4.1 4.2 3.5  CL 101 100 106  CO2 26 20* 22  BUN 17 21 24*  CREATININE 1.24 1.23 1.50*  CALCIUM 8.6* 8.0* 7.9*  PROT 6.0* 6.0* 5.4*  BILITOT 0.8 0.7 0.5  ALKPHOS 60 62 56  ALT 17 14 23   AST 22 17 28   GLUCOSE 165* 413* 174*    Imaging/Diagnostic Tests: No new Results  Armando Reichert, MD 02/06/2020, 9:40 AM PGY-1, [place] Intern pager: [phone], text pages welcome

## 2020-02-06 NOTE — Evaluation (Signed)
Occupational Therapy Evaluation Patient Details Name: Mike Wright MRN: [id number] DOB: 07/01/1933 Today's Date: 02/06/2020    History of Present Illness 86 y.o. male presenting with infected right foot wound. 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 Admitted 02/02/20 for treatment of R foot. s/p 02/05/20 R foot transmetatarsal amputation.    Clinical Impression   Patient admitted for diagnosis and procedure above.  He presents with fair stand balance, DOE and bleeding at his incision site limiting mobility.  Patient continues to bleed through bandage, had to limit distance due to blood trail on the floor.  OT will follow this patient 1 x/wk to ensure no acute or post acute needs.  On initial eval, he appears to be at his max functional potential in the acute setting.  Patient believes he may discharge home tomorrow.  PT is following for mobility.     Follow Up Recommendations  No OT follow up;Supervision - Intermittent    Equipment Recommendations    n/a   Recommendations for Other Services       Precautions / Restrictions Precautions Precautions: None Restrictions Weight Bearing Restrictions: No      Mobility Bed Mobility   Bed Mobility: Supine to Sit;Sit to Supine     Supine to sit: Supervision;HOB elevated Sit to supine: Supervision;HOB elevated        Transfers Overall transfer level: Needs assistance   Transfers: Sit to/from Stand Sit to Stand: Min assist         General transfer comment: ptient continues to bleed through bandage - keep mobility to in room distances.    Balance Overall balance assessment: Needs assistance Sitting-balance support: No upper extremity  supported Sitting balance-Leahy Scale: Good     Standing balance support: Bilateral upper extremity supported Standing balance-Leahy Scale: Fair                             ADL either performed or assessed with clinical judgement   ADL Overall ADL's : Needs assistance/impaired Eating/Feeding: Independent   Grooming: Wash/dry hands;Wash/dry face;Oral care;Supervision/safety;Standing           Upper Body Dressing : Set up;Sitting   Lower Body Dressing: Minimal assistance;Sit to/from stand   Toilet Transfer: Supervision/safety;RW   Toileting- Water quality scientist and Hygiene: Supervision/safety;Sit to/from stand       Functional mobility during ADLs: Min guard       Vision Baseline Vision/History: Wears glasses Wears Glasses: At all times Patient Visual Report: No change from baseline       Perception     Praxis      Pertinent Vitals/Pain Pain Assessment: Faces Faces Pain Scale: Hurts little more Pain Location: L foot Pain Descriptors / Indicators: Throbbing;Tightness Pain Intervention(s): Monitored during session     Hand Dominance Left   Extremity/Trunk Assessment Upper Extremity Assessment Upper Extremity Assessment: Overall WFL for tasks assessed   Lower Extremity Assessment Lower Extremity Assessment: Defer to PT evaluation   Cervical / Trunk Assessment Cervical / Trunk Assessment: Normal   Communication Communication Communication: No difficulties   Cognition Arousal/Alertness: Awake/alert Behavior During Therapy: WFL for tasks assessed/performed Overall Cognitive Status: Within Functional Limits for tasks assessed  General Comments   VSS               Home Living Family/patient expects to be discharged to:: Private residence Living Arrangements: Spouse/significant other Available Help at Discharge: Family;Available 24 hours/day Type of Home: House Home Access: Stairs  to enter   Entrance Stairs-Rails: Can reach both Home Layout: One level     Bathroom Shower/Tub: Other (comment) (Walk in tub with shower)   Bathroom Toilet: Standard Bathroom Accessibility: No   Home Equipment: Hand held shower head;Grab bars - tub/shower;Grab bars - toilet;Bedside commode;Walker - 4 wheels;Cane - single point;Shower seat - built in;Transport chair;Wheelchair - manual          Prior Functioning/Environment Level of Independence: Independent with assistive device(s)        Comments: used SPC up until the last 2-3 days before hospitalization where he used RW         OT Problem List: Impaired balance (sitting and/or standing);Decreased activity tolerance      OT Treatment/Interventions: Self-care/ADL training;Therapeutic exercise;Balance training;Therapeutic activities    OT Goals(Current goals can be found in the care plan section) Acute Rehab OT Goals Patient Stated Goal: Hopefully heal up so I can walk better. OT Goal Formulation: With patient Time For Goal Achievement: 02/20/20 Potential to Achieve Goals: Good ADL Goals Pt Will Perform Lower Body Bathing: with modified independence;sit to/from stand Pt Will Perform Lower Body Dressing: with modified independence Pt Will Transfer to Toilet: with modified independence;regular height toilet;ambulating  OT Frequency: Min 1X/week   Barriers to D/C:    None Noted       Co-evaluation              AM-PAC OT "6 Clicks" Daily Activity     Outcome Measure Help from another person eating meals?: None Help from another person taking care of personal grooming?: None Help from another person toileting, which includes using toliet, bedpan, or urinal?: A Little Help from another person bathing (including washing, rinsing, drying)?: A Little Help from another person to put on and taking off regular upper body clothing?: None Help from another person to put on and taking off regular lower body clothing?: A  Little 6 Click Score: 21   End of Session Equipment Utilized During Treatment: Gait belt;Rolling walker  Activity Tolerance: Patient tolerated treatment well;Other (comment) (limited by bleeding at incision site) Patient left: in bed;with call bell/phone within reach  OT Visit Diagnosis: Unsteadiness on feet (R26.81)                Time: 1441-1501 OT Time Calculation (min): 20 min Charges:  OT General Charges $OT Visit: 1 Visit OT Evaluation $OT Eval Moderate Complexity: 1 Mod  02/06/2020  Rich, OTR/L  Acute Rehabilitation Services  Office:  [place] 02/06/2020, 3:12 PM

## 2020-02-06 NOTE — Progress Notes (Addendum)
    Subjective  - POD #2, status post right transmetatarsal amputation  No complaints of pain   Physical Exam:  Dressing removed this morning.  Wound edges appear to be healing nicely.       Assessment/Plan:  POD #2  Cultures show GPC.  He will need 2 weeks of antibiotics Stable for discharge from my perspective.  Home health or his wife can do dressing changes.  I will see him in the office in 2 weeks.  Wells Yaeko Fazekas 02/06/2020 8:24 AM --  Vitals:   02/06/20 0400 02/06/20 0735  BP: 119/66 (!) 149/79  Pulse: 72 71  Resp: 19 19  Temp: 97.7 F (36.5 C) 97.8 F (36.6 C)  SpO2: 100% 98%    Intake/Output Summary (Last 24 hours) at 02/06/2020 0824 Last data filed at 02/06/2020 0327 Gross per 24 hour  Intake 590 ml  Output 1850 ml  Net -1260 ml     Laboratory CBC    Component Value Date/Time   WBC 7.8 02/06/2020 0140   HGB 7.5 (L) 02/06/2020 0530   HGB 12.3 (L) 05/30/2017 1531   HCT 25.2 (L) 02/06/2020 0530   HCT 37.7 05/30/2017 1531   PLT 258 02/06/2020 0140   PLT 212 05/30/2017 1531    BMET    Component Value Date/Time   NA 139 02/06/2020 0140   NA 139 05/30/2017 1531   K 3.5 02/06/2020 0140   CL 106 02/06/2020 0140   CO2 22 02/06/2020 0140   GLUCOSE 174 (H) 02/06/2020 0140   BUN 24 (H) 02/06/2020 0140   BUN 13 05/30/2017 1531   CREATININE 1.50 (H) 02/06/2020 0140   CREATININE 1.00 01/04/2018 1401   CALCIUM 7.9 (L) 02/06/2020 0140   GFRNONAA 46 (L) 02/06/2020 0140   GFRNONAA >60 01/04/2018 1401   GFRAA >60 12/04/2019 1416   GFRAA >60 01/04/2018 1401    COAG Lab Results  Component Value Date   INR 1.3 (H) 09/26/2019   INR 1.2 05/29/2019   INR 1.07 12/13/2017   No results found for: PTT  Antibiotics Anti-infectives (From admission, onward)   Start     Dose/Rate Route Frequency Ordered Stop   02/03/20 2245  vancomycin (VANCOREADY) IVPB 1250 mg/250 mL       "Followed by" Linked Group Details   1,250 mg 166.7 mL/hr over 90 Minutes  Intravenous Every 24 hours 02/02/20 2130     02/02/20 2145  cefTRIAXone (ROCEPHIN) 2 g in sodium chloride 0.9 % 100 mL IVPB        2 g 200 mL/hr over 30 Minutes Intravenous Every 24 hours 02/02/20 2130     02/02/20 2130  vancomycin (VANCOREADY) IVPB 1500 mg/300 mL       "Followed by" Linked Group Details   1,500 mg 150 mL/hr over 120 Minutes Intravenous  Once 02/02/20 2130 02/03/20 0027   02/02/20 1915  ciprofloxacin (CIPRO) IVPB 400 mg        400 mg 200 mL/hr over 60 Minutes Intravenous  Once 02/02/20 1901 02/02/20 2032       V. Leia Alf, M.D., [facility] [place] Office: [phone] Pager:  [phone]

## 2020-02-07 ENCOUNTER — Other Ambulatory Visit (HOSPITAL_COMMUNITY): Payer: Self-pay | Admitting: Family Medicine

## 2020-02-07 LAB — COMPREHENSIVE METABOLIC PANEL
ALT: 27 U/L (ref 0–44)
AST: 29 U/L (ref 15–41)
Albumin: 3 g/dL — ABNORMAL LOW (ref 3.5–5.0)
Alkaline Phosphatase: 55 U/L (ref 38–126)
Anion gap: 14 (ref 5–15)
BUN: 24 mg/dL — ABNORMAL HIGH (ref 8–23)
CO2: 24 mmol/L (ref 22–32)
Calcium: 8.6 mg/dL — ABNORMAL LOW (ref 8.9–10.3)
Chloride: 102 mmol/L (ref 98–111)
Creatinine, Ser: 1.25 mg/dL — ABNORMAL HIGH (ref 0.61–1.24)
GFR, Estimated: 57 mL/min — ABNORMAL LOW (ref >60)
Glucose, Bld: 102 mg/dL — ABNORMAL HIGH (ref 70–99)
Potassium: 3.6 mmol/L (ref 3.5–5.1)
Sodium: 140 mmol/L (ref 135–145)
Total Bilirubin: 0.9 mg/dL (ref 0.3–1.2)
Total Protein: 6.1 g/dL — ABNORMAL LOW (ref 6.5–8.1)

## 2020-02-07 LAB — CBC
HCT: 28.2 % — ABNORMAL LOW (ref 39.0–52.0)
Hemoglobin: 8.9 g/dL — ABNORMAL LOW (ref 13.0–17.0)
MCH: 23.4 pg — ABNORMAL LOW (ref 26.0–34.0)
MCHC: 31.6 g/dL (ref 30.0–36.0)
MCV: 74.2 fL — ABNORMAL LOW (ref 80.0–100.0)
Platelets: 296 10*3/uL (ref 150–400)
RBC: 3.8 MIL/uL — ABNORMAL LOW (ref 4.22–5.81)
RDW: 23.5 % — ABNORMAL HIGH (ref 11.5–15.5)
WBC: 8 10*3/uL (ref 4.0–10.5)
nRBC: 0 % (ref 0.0–0.2)

## 2020-02-07 LAB — GLUCOSE, CAPILLARY
Glucose-Capillary: 112 mg/dL — ABNORMAL HIGH (ref 70–99)
Glucose-Capillary: 198 mg/dL — ABNORMAL HIGH (ref 70–99)

## 2020-02-07 LAB — BPAM RBC
Blood Product Expiration Date: 202111152359
ISSUE DATE / TIME: 202111110959
Unit Type and Rh: 5100

## 2020-02-07 LAB — TYPE AND SCREEN
ABO/RH(D): O POS
Antibody Screen: NEGATIVE
Unit division: 0

## 2020-02-07 LAB — APTT: aPTT: 40 seconds — ABNORMAL HIGH (ref 24–36)

## 2020-02-07 MED ORDER — LINEZOLID 600 MG PO TABS
600.0000 mg | ORAL_TABLET | Freq: Two times a day (BID) | ORAL | 0 refills | Status: DC
Start: 2020-02-07 — End: 2020-02-07

## 2020-02-07 MED FILL — LINEZOLID 600 MG TABS: 600 | 10 days supply | Qty: 20 | Fill #0

## 2020-02-07 NOTE — Progress Notes (Signed)
The patient remains at his baseline. He had no concerns this morning.   Right foot Infection, concerning for osteomyelitis S/P right TMA. Outpatient f/u with vascular surgery. C/O total course of A/B treatment x 2 weeks. Now on oral Linezolid. Outpatient PT/OT eval.  Acute on Chronic Microcytic Anemia: Hemoglobin increased from 7.5 to 8.9 s/p 1 unit PRBC transfusion. Hemoglobin remains stable today. No active source of bleeding. Outpatient F/U with PCP for further workup and management.  His other chronic problems are stable. I will cosign the resident's d/c note once it is completed.

## 2020-02-07 NOTE — Discharge Instructions (Signed)
Dear [name],   Thank you for letting us participate in your care! In this section, you will find a brief hospital admission summary of why you were admitted to the hospital, what happened during your admission, your diagnosis/diagnoses, and recommended follow up.   You were admitted because you were experiencing pain in your foot.   You were seen by vascular surgery and orthopedic surgery who decided surgery would be the best treatment option.  You were treated with surgery and antibiotics.   You are being sent home with oral antibiotics and home health physical therapy and will have follow up with the surgeons outpatient.   POST-HOSPITAL & CARE INSTRUCTIONS 1. Please let PCP/Specialists know of any changes that were made.  2. Please see medications section of this packet for any medication changes.   DOCTOR'S APPOINTMENT & FOLLOW UP CARE INSTRUCTIONS  Future Appointments  Date Time Provider [place]  02/24/2020 10:40 AM Serafina Mitchell, MD [facility]  04/15/2020 11:40 AM Branch, Alphonse Guild, MD [facility]     Thank you for choosing [facility]! Take care and be well!  [facility]  [place], [place] [address] [phone]

## 2020-02-07 NOTE — TOC Transition Note (Signed)
Transition of Care (TOC) - CM/SW Discharge Note Marvetta Gibbons RN, BSN Transitions of Care Unit 4E- RN Case Manager See Treatment Team for direct phone #    Patient Details  Name: Mike Wright MRN: [id number] Date of Birth: Nov 26, 1933  Transition of Care [facility]) CM/SW Contact:  Dawayne Patricia, RN Phone Number: 02/07/2020, 12:11 PM   Clinical Narrative:    Pt s/p TMA, cleared by vascular for transition home, pending medical clearance. Orders placed for HHPT- CM spoke with pt at bedside- per conversation pt states he has all needed DME at home, asked about a extender for toilet- discussed 3n1 vs one that just sits on top of toilet- pt voices that he would prefer on that just fits on top and will f/u on his own at local store to purchase.  Offered HH choice with list provided Per CMS guidelines from medicare.gov website with star ratings (copy placed in shadow chart)- per pt he has used [facility] in past as well as [facility]- he would prefer to use [facility] again but if [name] is unable to accept states [facility] would be his second choice.  Address, phone #s and PCP all confirmed with pt in epic.   Call made to [facility] with [facility] for [facility] referral- referral for HHPT has been accepted.    Final next level of care: [place] Barriers to Discharge: No Barriers Identified   Patient Goals and CMS Choice Patient states their goals for this hospitalization and ongoing recovery are:: to be able to walk again CMS Medicare.gov Compare Post Acute Care list provided to:: Patient Choice offered to / list presented to : Patient  Discharge Placement               Home with [facility]        Discharge Plan and Services   Discharge Planning Services: CM Consult Post Acute Care Choice: Home Health          DME Arranged: N/A DME Agency: NA       HH Arranged: PT HH Agency: [place] Date [place]: 02/07/20 Time HH Agency Contacted: 1210 Representative spoke with  at [place]: [place]) Interventions     Readmission Risk Interventions Readmission Risk Prevention Plan 02/07/2020 07/27/2019  Transportation Screening Complete Complete  PCP or Specialist Appt within 3-5 Days Complete -  HRI or Home Care Consult Complete -  Social Work Consult for [place] Planning/Counseling Complete -  Palliative Care Screening Not Applicable Not Applicable  Medication Review Press photographer) Complete Complete  Some recent data might be hidden

## 2020-02-07 NOTE — Progress Notes (Signed)
Physical Therapy Treatment Patient Details Name: Mike Wright MRN: [id number] DOB: [birth year] Today's Date: 02/07/2020    History of Present Illness 85 y.o. male presenting with infected right foot wound. 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 Admitted 02/02/20 for treatment of R foot. s/p 02/05/20 R foot transmetatarsal amputation.     PT Comments    Patient received sitting up on side of bed. Agrees to walk. Bandage dry and no signs of bleeding this morning. Patient reports mild pain. Transfers sit to stand with mod independence. Ambulated 120 feet with RW and min guard. Good balance and safety awareness. Patient will continue to benefit from skilled PT while here to improve functional independence and strength.     Follow Up Recommendations  Home health PT;Supervision - Intermittent     Equipment Recommendations  None recommended by PT    Recommendations for Other Services       Precautions / Restrictions Precautions Precautions: None Restrictions Weight Bearing Restrictions: No RLE Weight Bearing: Weight bearing as tolerated    Mobility  Bed Mobility               General bed mobility comments: Patient received sitting up on side of bed.  Transfers Overall transfer level: Modified independent Equipment used: Rolling walker (2 wheeled) Transfers: Sit to/from Stand Sit to Stand: Modified independent (Device/Increase time)            Ambulation/Gait Ambulation/Gait assistance: Min guard Gait Distance (Feet): 120 Feet Assistive device: Rolling walker (2 wheeled) Gait Pattern/deviations: Step-through pattern Gait velocity: slightly decr   General Gait Details: Patient with  good balance using RW. Gait distance limited due to fatigue and soreness   Stairs             Wheelchair Mobility    Modified Rankin (Stroke Patients Only)       Balance Overall balance assessment: Modified Independent Sitting-balance support: Feet supported Sitting balance-Leahy Scale: Normal     Standing balance support: Bilateral upper extremity supported;During functional activity Standing balance-Leahy Scale: Good Standing balance comment: RW for assist with weight bearing/pain                            Cognition Arousal/Alertness: Awake/alert Behavior During Therapy: WFL for tasks assessed/performed Overall Cognitive Status: Within Functional Limits for tasks assessed                                        Exercises      General Comments        Pertinent Vitals/Pain Pain Assessment: Faces Faces Pain Scale: Hurts a little bit Pain Location: L foot Pain Descriptors / Indicators: Sore;Discomfort Pain Intervention(s): Monitored during session    Home Living                      Prior Function            PT Goals (current goals can now be found in the care plan section) Acute Rehab PT Goals Patient Stated Goal: Hopes to go home today PT Goal Formulation: With patient Time For Goal Achievement:  02/20/20 Potential to Achieve Goals: Good Progress towards PT goals: Progressing toward goals    Frequency    Min 3X/week      PT Plan Current plan remains appropriate    Co-evaluation              AM-PAC PT "6 Clicks" Mobility   Outcome Measure  Help needed turning from your back to your side while in a flat bed without using bedrails?: None Help needed moving from lying on your back to sitting on the side of a flat bed without using bedrails?: None Help needed moving to and from a bed to a chair (including a wheelchair)?: A Little Help needed standing up from a chair using your arms (e.g., wheelchair  or bedside chair)?: None Help needed to walk in hospital room?: A Little Help needed climbing 3-5 steps with a railing? : A Little 6 Click Score: 21    End of Session Equipment Utilized During Treatment: Gait belt Activity Tolerance: Patient tolerated treatment well Patient left: in bed;with call bell/phone within reach Nurse Communication: Mobility status PT Visit Diagnosis: Difficulty in walking, not elsewhere classified (R26.2);Pain Pain - Right/Left: Right Pain - part of body: Ankle and joints of foot     Time: 0955-1008 PT Time Calculation (min) (ACUTE ONLY): 13 min  Charges:  $Gait Training: 8-22 mins                    Reginia Battie, PT, GCS 02/07/20,10:17 AM

## 2020-02-07 NOTE — Progress Notes (Signed)
Mobility Specialist - Progress Note   02/07/20 1138  Mobility  Activity Refused mobility   Pt stated he is going to d/c today and would like to conserve his energy as he had PT earlier.   Pricilla Handler Mobility Specialist Mobility Specialist Phone: [phone]

## 2020-02-07 NOTE — Progress Notes (Signed)
Discharge orders received.  IV and telemetry removed, CCMD notified. Discharge instructions reviewed with patient, he expresses understanding.

## 2020-02-08 LAB — CULTURE, BLOOD (ROUTINE X 2)
Culture: NO GROWTH
Culture: NO GROWTH
Special Requests: ADEQUATE
Special Requests: ADEQUATE

## 2020-02-09 LAB — AEROBIC/ANAEROBIC CULTURE W GRAM STAIN (SURGICAL/DEEP WOUND): Gram Stain: NONE SEEN

## 2020-02-10 ENCOUNTER — Other Ambulatory Visit: Payer: Self-pay

## 2020-02-10 NOTE — Patient Outreach (Signed)
[facility]) Care Management  [place]  02/10/2020   NHAN QUALLEY 1933-09-06 [id number]  Subjective: Telephone call to patient for hospital follow up. Patient reports having half of foot amputated.  He states wound shows no signs of infection. His wife changes the dressing daily.  Home Health PT to start.  He states follow up with physician on 02-24-20.  He states a neighbor will take him.  Patient has all medications and offers no questions about medications.  Reviewed with patient signs of infection and importance of notifying physician of any changes to amputation site.  He verbalized understanding.    Objective:   Encounter Medications:  Outpatient Encounter Medications as of 02/10/2020  Medication Sig Note  . acetaminophen (TYLENOL) 500 MG tablet Take 1 tablet (500 mg total) by mouth every 6 (six) hours as needed for mild pain or headache.   [name] atorvastatin (LIPITOR) 80 MG tablet Take 80 mg by mouth every evening.    . Cholecalciferol (VITAMIN D3) 50 MCG (2000 UT) TABS Take 2,000 Units by mouth daily.   . Cyanocobalamin (B-12) 2500 MCG TABS Take 2,500 mcg by mouth daily.   . DROPLET INSULIN SYRINGE 31G X 5/16" 1 ML MISC    . ELIQUIS 5 MG TABS tablet Take 5 mg by mouth 2 (two) times daily.   . folic acid (FOLVITE) 1 MG tablet Take 1 tablet (1 mg total) by mouth daily.   . furosemide (LASIX) 80 MG tablet Take 80 mg by mouth See admin instructions. Take 80 mg by mouth in the morning and between 5-6 PM daily   . insulin glargine (LANTUS) 100 UNIT/ML injection Inject 30-40 Units into the skin See admin instructions. Inject 30 units into the skin in the morning and 40 units at bedtime 02/02/2020: Regimen confirmed to be accurate by the patient  . ipratropium-albuterol (DUONEB) 0.5-2.5 (3) MG/3ML SOLN Take 3 mLs by nebulization every evening.    . isosorbide mononitrate (IMDUR) 30 MG 24 hr tablet Take 1 tablet (30 mg total) by mouth in the morning and at bedtime.    [name] linezolid (ZYVOX) 600 MG tablet Take 1 tablet (600 mg total) by mouth every 12 (twelve) hours for 10 days.   [name] lisinopril (ZESTRIL) 5 MG tablet Take 0.5 tablets (2.5 mg total) by mouth daily.   . meclizine (ANTIVERT) 25 MG tablet Take 25 mg by mouth daily as needed for dizziness.   . metFORMIN (GLUCOPHAGE) 1000 MG tablet Take 1,000 mg by mouth 2 (two) times daily.   . nitroGLYCERIN (NITROSTAT) 0.4 MG SL tablet PLACE 1 TABLET (0.4 MG TOTAL) UNDER THE TONGUE EVERY 5  MINUTES AS NEEDED FOR CHEST PAIN. (Patient taking differently: Place 0.4 mg under the tongue every 5 (five) minutes as needed for chest pain. )   . OXYGEN Inhale 2 L/min into the lungs See admin instructions. 2 L/min of oxygen at bedtime and during the day as needed for shortness of breath   . pantoprazole (PROTONIX) 40 MG tablet Take 1 tablet (40 mg total) by mouth 2 (two) times daily. To protect stomach while taking multiple blood thinners.   . potassium chloride SA (K-DUR) 20 MEQ tablet Take 1 tablet (20 mEq total) by mouth daily.   . psyllium (METAMUCIL) 58.6 % packet Take 0.5 packets by mouth 2 (two) times daily. [place]   . SPIRIVA HANDIHALER 18 MCG inhalation capsule Place 1 capsule into inhaler and inhale daily after breakfast.    . traMADol (  ULTRAM) 50 MG tablet Take 1 tablet (50 mg total) by mouth every 8 (eight) hours as needed. (Patient taking differently: Take 50 mg by mouth every 8 (eight) hours as needed (for pain). )   . traZODone (DESYREL) 100 MG tablet Take 100-200 mg by mouth at bedtime.    [name] METRIX BLOOD GLUCOSE TEST test strip     No facility-administered encounter medications on file as of 02/10/2020.    Functional Status:  In your present state of health, do you have any difficulty performing the following activities: 02/10/2020 02/03/2020  Hearing? N N  Vision? N N  Comment - -  Difficulty concentrating or making decisions? N N  Walking or climbing stairs? Y Y  Comment weakness and amputation -   Dressing or bathing? N N  Doing errands, shopping? N N  Preparing Food and eating ? N -  Using the Toilet? N -  In the past six months, have you accidently leaked urine? Y -  Comment wears depends -  Do you have problems with loss of bowel control? N -  Managing your Medications? N -  Managing your Finances? N -  Housekeeping or managing your Housekeeping? Y -  Comment wife assists -  Some recent data might be hidden    Fall/Depression Screening: Fall Risk  02/10/2020 08/14/2019 07/29/2019  Falls in the past year? 1 1 0  Comment - week ago in the yard no injuries -  Number falls in past yr: 0 0 -  Injury with Fall? 0 0 -  Risk for fall due to : - - -  Follow up - Falls prevention discussed -   PHQ 2/9 Scores 02/10/2020 11/07/2019 07/29/2019 06/12/2019 12/20/2018  PHQ - 2 Score 0 0 0 0 0    Assessment: Patient with recent right transmetatarsal amputation.  Wound healing good per patient.   Goals Addressed            This Visit's Progress   . THN-Make and Keep All Appointments       Follow Up Date 03/26/20   - ask family or friend for a ride - keep a calendar with appointment dates    Why is this important?   Part of staying healthy is seeing the doctor for follow-up care.  If you forget your appointments, there are some things you can do to stay on track.    Notes:     . THN-Track and Manage Symptoms       Follow Up Date 03/26/20   - eat more whole grains, fruits and vegetables, lean meats and healthy fats - follow rescue plan if symptoms flare-up - know when to call the doctor - track symptoms and what helps feel better or worse    Why is this important?   You will be able to handle your symptoms better if you keep track of them.  Making some simple changes to your lifestyle will help.  Eating healthy is one thing you can do to take good care of yourself.    Notes:        Plan:  RN CM will send update to physician and new welcome letter to patient.  RN CM  will contact patient again within the next 3 weeks and patient agreeable.    [name], RN, MSN [place] Management Care Management Coordinator Direct Line [phone] Cell [phone] Toll Free: [phone]  Fax: [phone]

## 2020-02-11 DIAGNOSIS — Z89431 Acquired absence of right foot: Secondary | ICD-10-CM | POA: Diagnosis not present

## 2020-02-11 DIAGNOSIS — Z4781 Encounter for orthopedic aftercare following surgical amputation: Secondary | ICD-10-CM | POA: Diagnosis not present

## 2020-02-12 ENCOUNTER — Telehealth: Payer: Self-pay

## 2020-02-12 DIAGNOSIS — T8743 Infection of amputation stump, right lower extremity: Secondary | ICD-10-CM | POA: Diagnosis not present

## 2020-02-12 DIAGNOSIS — M869 Osteomyelitis, unspecified: Secondary | ICD-10-CM | POA: Diagnosis not present

## 2020-02-12 DIAGNOSIS — E1122 Type 2 diabetes mellitus with diabetic chronic kidney disease: Secondary | ICD-10-CM | POA: Diagnosis not present

## 2020-02-12 DIAGNOSIS — I10 Essential (primary) hypertension: Secondary | ICD-10-CM | POA: Diagnosis not present

## 2020-02-12 DIAGNOSIS — I130 Hypertensive heart and chronic kidney disease with heart failure and stage 1 through stage 4 chronic kidney disease, or unspecified chronic kidney disease: Secondary | ICD-10-CM | POA: Diagnosis not present

## 2020-02-12 DIAGNOSIS — I5033 Acute on chronic diastolic (congestive) heart failure: Secondary | ICD-10-CM | POA: Diagnosis not present

## 2020-02-12 DIAGNOSIS — E1169 Type 2 diabetes mellitus with other specified complication: Secondary | ICD-10-CM | POA: Diagnosis not present

## 2020-02-12 DIAGNOSIS — N1831 Chronic kidney disease, stage 3a: Secondary | ICD-10-CM | POA: Diagnosis not present

## 2020-02-12 DIAGNOSIS — D631 Anemia in chronic kidney disease: Secondary | ICD-10-CM | POA: Diagnosis not present

## 2020-02-12 DIAGNOSIS — L03115 Cellulitis of right lower limb: Secondary | ICD-10-CM | POA: Diagnosis not present

## 2020-02-12 NOTE — Telephone Encounter (Signed)
Spoke with Colin Rhein PA and gave verbal okay for [facility] to see patient once a week for assessment and dressing changes for R TMA.

## 2020-02-13 ENCOUNTER — Telehealth: Payer: Self-pay

## 2020-02-14 DIAGNOSIS — E1122 Type 2 diabetes mellitus with diabetic chronic kidney disease: Secondary | ICD-10-CM | POA: Diagnosis not present

## 2020-02-14 DIAGNOSIS — T8743 Infection of amputation stump, right lower extremity: Secondary | ICD-10-CM | POA: Diagnosis not present

## 2020-02-14 DIAGNOSIS — M869 Osteomyelitis, unspecified: Secondary | ICD-10-CM | POA: Diagnosis not present

## 2020-02-14 DIAGNOSIS — D631 Anemia in chronic kidney disease: Secondary | ICD-10-CM | POA: Diagnosis not present

## 2020-02-14 DIAGNOSIS — N1831 Chronic kidney disease, stage 3a: Secondary | ICD-10-CM | POA: Diagnosis not present

## 2020-02-14 DIAGNOSIS — E1169 Type 2 diabetes mellitus with other specified complication: Secondary | ICD-10-CM | POA: Diagnosis not present

## 2020-02-14 DIAGNOSIS — I130 Hypertensive heart and chronic kidney disease with heart failure and stage 1 through stage 4 chronic kidney disease, or unspecified chronic kidney disease: Secondary | ICD-10-CM | POA: Diagnosis not present

## 2020-02-14 DIAGNOSIS — L03115 Cellulitis of right lower limb: Secondary | ICD-10-CM | POA: Diagnosis not present

## 2020-02-14 DIAGNOSIS — I5033 Acute on chronic diastolic (congestive) heart failure: Secondary | ICD-10-CM | POA: Diagnosis not present

## 2020-02-17 DIAGNOSIS — T8743 Infection of amputation stump, right lower extremity: Secondary | ICD-10-CM | POA: Diagnosis not present

## 2020-02-17 DIAGNOSIS — Z299 Encounter for prophylactic measures, unspecified: Secondary | ICD-10-CM | POA: Diagnosis not present

## 2020-02-17 DIAGNOSIS — I130 Hypertensive heart and chronic kidney disease with heart failure and stage 1 through stage 4 chronic kidney disease, or unspecified chronic kidney disease: Secondary | ICD-10-CM | POA: Diagnosis not present

## 2020-02-17 DIAGNOSIS — I5033 Acute on chronic diastolic (congestive) heart failure: Secondary | ICD-10-CM | POA: Diagnosis not present

## 2020-02-17 DIAGNOSIS — Z89619 Acquired absence of unspecified leg above knee: Secondary | ICD-10-CM | POA: Diagnosis not present

## 2020-02-17 DIAGNOSIS — I509 Heart failure, unspecified: Secondary | ICD-10-CM | POA: Diagnosis not present

## 2020-02-17 DIAGNOSIS — N1831 Chronic kidney disease, stage 3a: Secondary | ICD-10-CM | POA: Diagnosis not present

## 2020-02-17 DIAGNOSIS — L03115 Cellulitis of right lower limb: Secondary | ICD-10-CM | POA: Diagnosis not present

## 2020-02-17 DIAGNOSIS — E1122 Type 2 diabetes mellitus with diabetic chronic kidney disease: Secondary | ICD-10-CM | POA: Diagnosis not present

## 2020-02-17 DIAGNOSIS — D631 Anemia in chronic kidney disease: Secondary | ICD-10-CM | POA: Diagnosis not present

## 2020-02-17 DIAGNOSIS — E1169 Type 2 diabetes mellitus with other specified complication: Secondary | ICD-10-CM | POA: Diagnosis not present

## 2020-02-17 DIAGNOSIS — I10 Essential (primary) hypertension: Secondary | ICD-10-CM | POA: Diagnosis not present

## 2020-02-17 DIAGNOSIS — M869 Osteomyelitis, unspecified: Secondary | ICD-10-CM | POA: Diagnosis not present

## 2020-02-18 DIAGNOSIS — M869 Osteomyelitis, unspecified: Secondary | ICD-10-CM | POA: Diagnosis not present

## 2020-02-18 DIAGNOSIS — I130 Hypertensive heart and chronic kidney disease with heart failure and stage 1 through stage 4 chronic kidney disease, or unspecified chronic kidney disease: Secondary | ICD-10-CM | POA: Diagnosis not present

## 2020-02-18 DIAGNOSIS — E1169 Type 2 diabetes mellitus with other specified complication: Secondary | ICD-10-CM | POA: Diagnosis not present

## 2020-02-18 DIAGNOSIS — L03115 Cellulitis of right lower limb: Secondary | ICD-10-CM | POA: Diagnosis not present

## 2020-02-18 DIAGNOSIS — D631 Anemia in chronic kidney disease: Secondary | ICD-10-CM | POA: Diagnosis not present

## 2020-02-18 DIAGNOSIS — T8743 Infection of amputation stump, right lower extremity: Secondary | ICD-10-CM | POA: Diagnosis not present

## 2020-02-18 DIAGNOSIS — N1831 Chronic kidney disease, stage 3a: Secondary | ICD-10-CM | POA: Diagnosis not present

## 2020-02-18 DIAGNOSIS — I5033 Acute on chronic diastolic (congestive) heart failure: Secondary | ICD-10-CM | POA: Diagnosis not present

## 2020-02-18 DIAGNOSIS — E1122 Type 2 diabetes mellitus with diabetic chronic kidney disease: Secondary | ICD-10-CM | POA: Diagnosis not present

## 2020-02-19 DIAGNOSIS — M869 Osteomyelitis, unspecified: Secondary | ICD-10-CM | POA: Diagnosis not present

## 2020-02-19 DIAGNOSIS — L03115 Cellulitis of right lower limb: Secondary | ICD-10-CM | POA: Diagnosis not present

## 2020-02-19 DIAGNOSIS — D631 Anemia in chronic kidney disease: Secondary | ICD-10-CM | POA: Diagnosis not present

## 2020-02-19 DIAGNOSIS — E1169 Type 2 diabetes mellitus with other specified complication: Secondary | ICD-10-CM | POA: Diagnosis not present

## 2020-02-19 DIAGNOSIS — I130 Hypertensive heart and chronic kidney disease with heart failure and stage 1 through stage 4 chronic kidney disease, or unspecified chronic kidney disease: Secondary | ICD-10-CM | POA: Diagnosis not present

## 2020-02-19 DIAGNOSIS — T8743 Infection of amputation stump, right lower extremity: Secondary | ICD-10-CM | POA: Diagnosis not present

## 2020-02-19 DIAGNOSIS — N1831 Chronic kidney disease, stage 3a: Secondary | ICD-10-CM | POA: Diagnosis not present

## 2020-02-19 DIAGNOSIS — I5033 Acute on chronic diastolic (congestive) heart failure: Secondary | ICD-10-CM | POA: Diagnosis not present

## 2020-02-19 DIAGNOSIS — E1122 Type 2 diabetes mellitus with diabetic chronic kidney disease: Secondary | ICD-10-CM | POA: Diagnosis not present

## 2020-02-21 ENCOUNTER — Other Ambulatory Visit: Payer: Self-pay | Admitting: Cardiology

## 2020-02-21 DIAGNOSIS — L03115 Cellulitis of right lower limb: Secondary | ICD-10-CM | POA: Diagnosis not present

## 2020-02-21 DIAGNOSIS — D631 Anemia in chronic kidney disease: Secondary | ICD-10-CM | POA: Diagnosis not present

## 2020-02-21 DIAGNOSIS — E1169 Type 2 diabetes mellitus with other specified complication: Secondary | ICD-10-CM | POA: Diagnosis not present

## 2020-02-21 DIAGNOSIS — I5033 Acute on chronic diastolic (congestive) heart failure: Secondary | ICD-10-CM | POA: Diagnosis not present

## 2020-02-21 DIAGNOSIS — T8743 Infection of amputation stump, right lower extremity: Secondary | ICD-10-CM | POA: Diagnosis not present

## 2020-02-21 DIAGNOSIS — E1122 Type 2 diabetes mellitus with diabetic chronic kidney disease: Secondary | ICD-10-CM | POA: Diagnosis not present

## 2020-02-21 DIAGNOSIS — M869 Osteomyelitis, unspecified: Secondary | ICD-10-CM | POA: Diagnosis not present

## 2020-02-21 DIAGNOSIS — N1831 Chronic kidney disease, stage 3a: Secondary | ICD-10-CM | POA: Diagnosis not present

## 2020-02-21 DIAGNOSIS — I130 Hypertensive heart and chronic kidney disease with heart failure and stage 1 through stage 4 chronic kidney disease, or unspecified chronic kidney disease: Secondary | ICD-10-CM | POA: Diagnosis not present

## 2020-02-22 DIAGNOSIS — I509 Heart failure, unspecified: Secondary | ICD-10-CM | POA: Diagnosis not present

## 2020-02-24 ENCOUNTER — Other Ambulatory Visit: Payer: Self-pay

## 2020-02-24 ENCOUNTER — Encounter: Payer: Self-pay | Admitting: Surgery

## 2020-02-24 ENCOUNTER — Ambulatory Visit (INDEPENDENT_AMBULATORY_CARE_PROVIDER_SITE_OTHER): Payer: Self-pay | Admitting: Surgery

## 2020-02-24 VITALS — BP 120/73 | HR 81 | Temp 97.7°F | Resp 20 | Ht 67.0 in | Wt 184.0 lb

## 2020-02-24 DIAGNOSIS — I7025 Atherosclerosis of native arteries of other extremities with ulceration: Secondary | ICD-10-CM

## 2020-02-24 DIAGNOSIS — D631 Anemia in chronic kidney disease: Secondary | ICD-10-CM | POA: Diagnosis not present

## 2020-02-24 DIAGNOSIS — E1169 Type 2 diabetes mellitus with other specified complication: Secondary | ICD-10-CM | POA: Diagnosis not present

## 2020-02-24 DIAGNOSIS — M869 Osteomyelitis, unspecified: Secondary | ICD-10-CM | POA: Diagnosis not present

## 2020-02-24 DIAGNOSIS — L03115 Cellulitis of right lower limb: Secondary | ICD-10-CM | POA: Diagnosis not present

## 2020-02-24 DIAGNOSIS — N1831 Chronic kidney disease, stage 3a: Secondary | ICD-10-CM | POA: Diagnosis not present

## 2020-02-24 DIAGNOSIS — T8743 Infection of amputation stump, right lower extremity: Secondary | ICD-10-CM | POA: Diagnosis not present

## 2020-02-24 DIAGNOSIS — E1122 Type 2 diabetes mellitus with diabetic chronic kidney disease: Secondary | ICD-10-CM | POA: Diagnosis not present

## 2020-02-24 DIAGNOSIS — I130 Hypertensive heart and chronic kidney disease with heart failure and stage 1 through stage 4 chronic kidney disease, or unspecified chronic kidney disease: Secondary | ICD-10-CM | POA: Diagnosis not present

## 2020-02-24 DIAGNOSIS — I5033 Acute on chronic diastolic (congestive) heart failure: Secondary | ICD-10-CM | POA: Diagnosis not present

## 2020-02-24 NOTE — Progress Notes (Signed)
Patient name: Mike Wright MRN: [id number] DOB: 22-Apr-1933 Sex: male  REASON FOR VISIT:    Postop  HISTORY OF PRESENT ILLNESS:   AADITH Wright is a 86 y.o. male who is status post right transmetatarsal amputation on 02/05/2020.  He is here for his postoperative visit  CURRENT MEDICATIONS:    Current Outpatient Medications  Medication Sig Dispense Refill  . acetaminophen (TYLENOL) 500 MG tablet Take 1 tablet (500 mg total) by mouth every 6 (six) hours as needed for mild pain or headache. 30 tablet 0  . atorvastatin (LIPITOR) 80 MG tablet Take 80 mg by mouth every evening.     . Cholecalciferol (VITAMIN D3) 50 MCG (2000 UT) TABS Take 2,000 Units by mouth daily.    . Cyanocobalamin (B-12) 2500 MCG TABS Take 2,500 mcg by mouth daily.    . DROPLET INSULIN SYRINGE 31G X 5/16" 1 ML MISC     . ELIQUIS 5 MG TABS tablet Take 5 mg by mouth 2 (two) times daily.    . folic acid (FOLVITE) 1 MG tablet Take 1 tablet (1 mg total) by mouth daily. 30 tablet 1  . furosemide (LASIX) 80 MG tablet Take 80 mg by mouth See admin instructions. Take 80 mg by mouth in the morning and between 5-6 PM daily    . insulin glargine (LANTUS) 100 UNIT/ML injection Inject 30-40 Units into the skin See admin instructions. Inject 30 units into the skin in the morning and 40 units at bedtime    . ipratropium-albuterol (DUONEB) 0.5-2.5 (3) MG/3ML SOLN Take 3 mLs by nebulization every evening.     . isosorbide mononitrate (IMDUR) 30 MG 24 hr tablet Take 1 tablet (30 mg total) by mouth in the morning and at bedtime. 180 tablet 3  . lisinopril (ZESTRIL) 5 MG tablet Take 0.5 tablets (2.5 mg total) by mouth daily.    . meclizine (ANTIVERT) 25 MG tablet Take 25 mg by mouth daily as needed for dizziness.    . metFORMIN (GLUCOPHAGE) 1000 MG tablet Take 1,000 mg by mouth 2 (two) times daily.    . nitroGLYCERIN (NITROSTAT) 0.4 MG SL tablet PLACE 1 TABLET (0.4 MG TOTAL) UNDER THE TONGUE EVERY 5   MINUTES AS NEEDED FOR CHEST PAIN. (Patient taking differently: Place 0.4 mg under the tongue every 5 (five) minutes as needed for chest pain. ) 25 tablet 3  . OXYGEN Inhale 2 L/min into the lungs See admin instructions. 2 L/min of oxygen at bedtime and during the day as needed for shortness of breath    . pantoprazole (PROTONIX) 40 MG tablet Take 1 tablet (40 mg total) by mouth 2 (two) times daily. To protect stomach while taking multiple blood thinners. 60 tablet 2  . potassium chloride SA (K-DUR) 20 MEQ tablet Take 1 tablet (20 mEq total) by mouth daily. 30 tablet 1  . psyllium (METAMUCIL) 58.6 % packet Take 0.5 packets by mouth 2 (two) times daily. [place]    . SPIRIVA HANDIHALER 18 MCG inhalation capsule Place 1 capsule into inhaler and inhale daily after breakfast.     . traMADol (ULTRAM) 50 MG tablet Take 1 tablet (50 mg total) by mouth every 8 (eight) hours as needed. (Patient taking differently: Take 50 mg by mouth every 8 (eight) hours as needed (for pain). ) 20 tablet 0  . traZODone (DESYREL) 100 MG tablet Take 100-200 mg by mouth at bedtime.     . TRUE METRIX BLOOD GLUCOSE TEST test strip  No current facility-administered medications for this visit.    REVIEW OF SYSTEMS:   [X]  denotes positive finding, [ ]  denotes negative finding Cardiac  Comments:  Chest pain or chest pressure:    Shortness of breath upon exertion:    Short of breath when lying flat:    Irregular heart rhythm:    Constitutional    Fever or chills:      PHYSICAL EXAM:   Vitals:   02/24/20 1044  BP: 120/73  Pulse: 81  Resp: 20  Temp: 97.7 F (36.5 C)  SpO2: 97%  Weight: 184 lb (83.5 kg)  Height: 5\' 7"  (1.702 m)    GENERAL: The patient is a well-nourished male, in no acute distress. The vital signs are documented above. CARDIOVASCULAR: There is a regular rate and rhythm. PULMONARY: Non-labored respirations There is a 1 cm area over the incision where the skin edges are not approximated.   There is healthy granulation tissue.  STUDIES:      MEDICAL ISSUES:   Follow-up 1 week for suture removal  Leia Alf, MD, FACS [facility] [phone] Pager [phone]

## 2020-02-25 DIAGNOSIS — E7800 Pure hypercholesterolemia, unspecified: Secondary | ICD-10-CM | POA: Diagnosis not present

## 2020-02-25 DIAGNOSIS — I10 Essential (primary) hypertension: Secondary | ICD-10-CM | POA: Diagnosis not present

## 2020-02-25 DIAGNOSIS — I5033 Acute on chronic diastolic (congestive) heart failure: Secondary | ICD-10-CM | POA: Diagnosis not present

## 2020-02-25 DIAGNOSIS — I130 Hypertensive heart and chronic kidney disease with heart failure and stage 1 through stage 4 chronic kidney disease, or unspecified chronic kidney disease: Secondary | ICD-10-CM | POA: Diagnosis not present

## 2020-02-25 DIAGNOSIS — E1122 Type 2 diabetes mellitus with diabetic chronic kidney disease: Secondary | ICD-10-CM | POA: Diagnosis not present

## 2020-02-25 DIAGNOSIS — L03115 Cellulitis of right lower limb: Secondary | ICD-10-CM | POA: Diagnosis not present

## 2020-02-25 DIAGNOSIS — E1169 Type 2 diabetes mellitus with other specified complication: Secondary | ICD-10-CM | POA: Diagnosis not present

## 2020-02-25 DIAGNOSIS — M869 Osteomyelitis, unspecified: Secondary | ICD-10-CM | POA: Diagnosis not present

## 2020-02-25 DIAGNOSIS — T8743 Infection of amputation stump, right lower extremity: Secondary | ICD-10-CM | POA: Diagnosis not present

## 2020-02-25 DIAGNOSIS — D631 Anemia in chronic kidney disease: Secondary | ICD-10-CM | POA: Diagnosis not present

## 2020-02-25 DIAGNOSIS — N1831 Chronic kidney disease, stage 3a: Secondary | ICD-10-CM | POA: Diagnosis not present

## 2020-02-26 ENCOUNTER — Other Ambulatory Visit: Payer: Self-pay

## 2020-02-26 NOTE — Patient Outreach (Signed)
[facility]) Care Management  [place]  02/26/2020   Mike Wright [birth year] [id number]  Subjective: Telephone call to patient for follow up. Patient reports he is having some foot pain this am.  He states he uses tylenol for pain. Patient states he saw surgeon did not take out the sutures on visit on Monday but will reassess on Monday.  Discussed signs of infection and importance of notifying physician. He verbalized understanding. Patient continues to weigh daily. Weight this am was 177 lbs.  Patient feels he is managing good with heart failure.  He voices no concerns.     Objective:   Encounter Medications:  Outpatient Encounter Medications as of 02/26/2020  Medication Sig Note  . acetaminophen (TYLENOL) 500 MG tablet Take 1 tablet (500 mg total) by mouth every 6 (six) hours as needed for mild pain or headache.   [name] atorvastatin (LIPITOR) 80 MG tablet Take 80 mg by mouth every evening.    . Cholecalciferol (VITAMIN D3) 50 MCG (2000 UT) TABS Take 2,000 Units by mouth daily.   . Cyanocobalamin (B-12) 2500 MCG TABS Take 2,500 mcg by mouth daily.   . DROPLET INSULIN SYRINGE 31G X 5/16" 1 ML MISC    . ELIQUIS 5 MG TABS tablet TAKE 1 TABLET TWICE DAILY   . folic acid (FOLVITE) 1 MG tablet Take 1 tablet (1 mg total) by mouth daily.   . furosemide (LASIX) 80 MG tablet Take 80 mg by mouth See admin instructions. Take 80 mg by mouth in the morning and between 5-6 PM daily   . insulin glargine (LANTUS) 100 UNIT/ML injection Inject 30-40 Units into the skin See admin instructions. Inject 30 units into the skin in the morning and 40 units at bedtime 02/02/2020: Regimen confirmed to be accurate by the patient  . ipratropium-albuterol (DUONEB) 0.5-2.5 (3) MG/3ML SOLN Take 3 mLs by nebulization every evening.    . isosorbide mononitrate (IMDUR) 30 MG 24 hr tablet Take 1 tablet (30 mg total) by mouth in the morning and at bedtime.   [name] lisinopril (ZESTRIL) 5 MG tablet Take 0.5  tablets (2.5 mg total) by mouth daily.   . meclizine (ANTIVERT) 25 MG tablet Take 25 mg by mouth daily as needed for dizziness.   . metFORMIN (GLUCOPHAGE) 1000 MG tablet Take 1,000 mg by mouth 2 (two) times daily.   . nitroGLYCERIN (NITROSTAT) 0.4 MG SL tablet PLACE 1 TABLET (0.4 MG TOTAL) UNDER THE TONGUE EVERY 5  MINUTES AS NEEDED FOR CHEST PAIN. (Patient taking differently: Place 0.4 mg under the tongue every 5 (five) minutes as needed for chest pain. )   . OXYGEN Inhale 2 L/min into the lungs See admin instructions. 2 L/min of oxygen at bedtime and during the day as needed for shortness of breath   . pantoprazole (PROTONIX) 40 MG tablet Take 1 tablet (40 mg total) by mouth 2 (two) times daily. To protect stomach while taking multiple blood thinners.   . potassium chloride SA (K-DUR) 20 MEQ tablet Take 1 tablet (20 mEq total) by mouth daily.   . psyllium (METAMUCIL) 58.6 % packet Take 0.5 packets by mouth 2 (two) times daily. [place]   . SPIRIVA HANDIHALER 18 MCG inhalation capsule Place 1 capsule into inhaler and inhale daily after breakfast.    . traMADol (ULTRAM) 50 MG tablet Take 1 tablet (50 mg total) by mouth every 8 (eight) hours as needed. (Patient taking differently: Take 50 mg by mouth every 8 (  eight) hours as needed (for pain). )   . traZODone (DESYREL) 100 MG tablet Take 100-200 mg by mouth at bedtime.    [name] METRIX BLOOD GLUCOSE TEST test strip     No facility-administered encounter medications on file as of 02/26/2020.    Functional Status:  In your present state of health, do you have any difficulty performing the following activities: 02/10/2020 02/03/2020  Hearing? N N  Vision? N N  Comment - -  Difficulty concentrating or making decisions? N N  Walking or climbing stairs? Y Y  Comment weakness and amputation -  Dressing or bathing? N N  Doing errands, shopping? N N  Preparing Food and eating ? N -  Using the Toilet? N -  In the past six months, have you  accidently leaked urine? Y -  Comment wears depends -  Do you have problems with loss of bowel control? N -  Managing your Medications? N -  Managing your Finances? N -  Housekeeping or managing your Housekeeping? Y -  Comment wife assists -  Some recent data might be hidden    Fall/Depression Screening: Fall Risk  02/10/2020 08/14/2019 07/29/2019  Falls in the past year? 1 1 0  Comment - week ago in the yard no injuries -  Number falls in past yr: 0 0 -  Injury with Fall? 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  Patient seeing physicians regularly.   Goals Addressed              This Visit's Progress   .  THN-Make and Keep All Appointments   On track     Follow Up Date 03/26/20   - ask family or friend for a ride - keep a calendar with appointment dates    Why is this important?   Part of staying healthy is seeing the doctor for follow-up care.  If you forget your appointments, there are some things you can do to stay on track.    Notes:     .  THN-Monitor foot wound for infection (pt-stated)        Follow Up Date 04/24/20   - clean and dry skin well  -Place dressing as ordered   Why is this important?    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    Taking really good care of your skin will help to keep your skin unbroken.    Notes: Patient denies signs of infection.      .  THN-Track and Manage Symptoms   On track     Follow Up Date 03/26/20   - eat more whole grains, fruits and vegetables, lean meats and healthy fats - follow rescue plan if symptoms flare-up - know when to call the doctor - track symptoms and what helps feel better or worse    Why is this important?   You will be able to handle your symptoms better if you keep track of them.    Making some simple changes to your lifestyle will help.  Eating healthy is one thing you can do to take good care of yourself.    Notes:        Plan: RN CM  will contact patient again this month and patient agreeable.    [name], RN, MSN [place] [facility] Care Management Coordinator Direct Line [phone] Cell [phone] Toll Free: [phone]  Fax: [phone]

## 2020-02-26 NOTE — Patient Instructions (Signed)
Goals Addressed              This Visit's Progress   .  THN-Make and Keep All Appointments   On track     Follow Up Date 03/26/20   - ask family or friend for a ride - keep a calendar with appointment dates    Why is this important?   Part of staying healthy is seeing the doctor for follow-up care.  If you forget your appointments, there are some things you can do to stay on track.    Notes:     .  THN-Monitor foot wound for infection (pt-stated)        Follow Up Date 04/24/20   - clean and dry skin well  -Place dressing as ordered   Why is this important?    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    Taking really good care of your skin will help to keep your skin unbroken.    Notes: Patient denies signs of infection.      .  THN-Track and Manage Symptoms   On track     Follow Up Date 03/26/20   - eat more whole grains, fruits and vegetables, lean meats and healthy fats - follow rescue plan if symptoms flare-up - know when to call the doctor - track symptoms and what helps feel better or worse    Why is this important?   You will be able to handle your symptoms better if you keep track of them.  Making some simple changes to your lifestyle will help.  Eating healthy is one thing you can do to take good care of yourself.    Notes:

## 2020-02-27 DIAGNOSIS — Z89431 Acquired absence of right foot: Secondary | ICD-10-CM | POA: Diagnosis not present

## 2020-02-27 DIAGNOSIS — N1831 Chronic kidney disease, stage 3a: Secondary | ICD-10-CM | POA: Diagnosis not present

## 2020-02-27 DIAGNOSIS — E1169 Type 2 diabetes mellitus with other specified complication: Secondary | ICD-10-CM | POA: Diagnosis not present

## 2020-02-27 DIAGNOSIS — Z89412 Acquired absence of left great toe: Secondary | ICD-10-CM | POA: Diagnosis not present

## 2020-02-27 DIAGNOSIS — Z7901 Long term (current) use of anticoagulants: Secondary | ICD-10-CM | POA: Diagnosis not present

## 2020-02-27 DIAGNOSIS — T8743 Infection of amputation stump, right lower extremity: Secondary | ICD-10-CM | POA: Diagnosis not present

## 2020-02-27 DIAGNOSIS — M869 Osteomyelitis, unspecified: Secondary | ICD-10-CM | POA: Diagnosis not present

## 2020-02-27 DIAGNOSIS — E1122 Type 2 diabetes mellitus with diabetic chronic kidney disease: Secondary | ICD-10-CM | POA: Diagnosis not present

## 2020-02-27 DIAGNOSIS — I5033 Acute on chronic diastolic (congestive) heart failure: Secondary | ICD-10-CM | POA: Diagnosis not present

## 2020-02-27 DIAGNOSIS — L03115 Cellulitis of right lower limb: Secondary | ICD-10-CM | POA: Diagnosis not present

## 2020-02-27 DIAGNOSIS — I130 Hypertensive heart and chronic kidney disease with heart failure and stage 1 through stage 4 chronic kidney disease, or unspecified chronic kidney disease: Secondary | ICD-10-CM | POA: Diagnosis not present

## 2020-02-27 DIAGNOSIS — D692 Other nonthrombocytopenic purpura: Secondary | ICD-10-CM | POA: Diagnosis not present

## 2020-02-27 DIAGNOSIS — N183 Chronic kidney disease, stage 3 unspecified: Secondary | ICD-10-CM | POA: Diagnosis not present

## 2020-02-27 DIAGNOSIS — D631 Anemia in chronic kidney disease: Secondary | ICD-10-CM | POA: Diagnosis not present

## 2020-02-27 DIAGNOSIS — Z6827 Body mass index (BMI) 27.0-27.9, adult: Secondary | ICD-10-CM | POA: Diagnosis not present

## 2020-02-28 DIAGNOSIS — E1169 Type 2 diabetes mellitus with other specified complication: Secondary | ICD-10-CM | POA: Diagnosis not present

## 2020-02-28 DIAGNOSIS — M869 Osteomyelitis, unspecified: Secondary | ICD-10-CM | POA: Diagnosis not present

## 2020-02-28 DIAGNOSIS — L03115 Cellulitis of right lower limb: Secondary | ICD-10-CM | POA: Diagnosis not present

## 2020-02-28 DIAGNOSIS — I130 Hypertensive heart and chronic kidney disease with heart failure and stage 1 through stage 4 chronic kidney disease, or unspecified chronic kidney disease: Secondary | ICD-10-CM | POA: Diagnosis not present

## 2020-02-28 DIAGNOSIS — N1831 Chronic kidney disease, stage 3a: Secondary | ICD-10-CM | POA: Diagnosis not present

## 2020-02-28 DIAGNOSIS — E1122 Type 2 diabetes mellitus with diabetic chronic kidney disease: Secondary | ICD-10-CM | POA: Diagnosis not present

## 2020-02-28 DIAGNOSIS — I5033 Acute on chronic diastolic (congestive) heart failure: Secondary | ICD-10-CM | POA: Diagnosis not present

## 2020-02-28 DIAGNOSIS — T8743 Infection of amputation stump, right lower extremity: Secondary | ICD-10-CM | POA: Diagnosis not present

## 2020-02-28 DIAGNOSIS — D631 Anemia in chronic kidney disease: Secondary | ICD-10-CM | POA: Diagnosis not present

## 2020-03-02 ENCOUNTER — Encounter (HOSPITAL_COMMUNITY): Payer: Medicare HMO

## 2020-03-02 ENCOUNTER — Other Ambulatory Visit: Payer: Self-pay

## 2020-03-02 ENCOUNTER — Ambulatory Visit (INDEPENDENT_AMBULATORY_CARE_PROVIDER_SITE_OTHER): Payer: Self-pay | Admitting: Physician Assistant

## 2020-03-02 ENCOUNTER — Ambulatory Visit: Payer: Medicare HMO | Admitting: Surgery

## 2020-03-02 VITALS — BP 110/67 | HR 75 | Temp 98.1°F | Resp 20 | Ht 67.0 in | Wt 180.8 lb

## 2020-03-02 DIAGNOSIS — Z89431 Acquired absence of right foot: Secondary | ICD-10-CM

## 2020-03-02 NOTE — Progress Notes (Signed)
POST OPERATIVE OFFICE NOTE    CC:  F/u for surgery  HPI:  This is a 86 y.o. male who is s/p arteriogram on 02/04/2020, which revealed no hemodynamically significant lesions.  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 Trula Slade.    He was seen back on 02/24/2020 and at that time, he had a 1cm area over the incision where the skin edges were not approximated but there was healthy granulation tissue.  He was scheduled for suture removal in one week.  He is here for that visit.   He states overall he is doing well.  He continues with wet to dry dressing changes once and sometimes twice daily.  He states that he has not had any fevers.  He fell Thursday while wearing his Darco shoe.  He does have some ankle pain but is able to flex his ankle.  He did not get an xray.    Pt states that he has anemia and they are unable to determine the cause thus far.  He requires transfusion every few months.  He is on Eliquis for afib.  He is on Plavix.   Allergies  Allergen Reactions  . Bactrim [Sulfamethoxazole-Trimethoprim] Other (See Comments)    Pancytopenia  . Codeine Shortness Of Breath  . Doxycycline Swelling    Swelling and numbness in lips and face. Swelling improved after stopping. Reports still experiences numbness in bottom lip.   Shirlean Kelly [Ferumoxytol] Other (See Comments)    Diaphoretic, chest pain  . Heparin Other (See Comments)    +HIT,  Severe bleeding (with heparin drip & large doses), tolerates low doses  . Losartan Swelling  . Oxycodone Other (See Comments)    "Made me act out of my mind" Mental status changes- hallucinations  . Latex Rash    Current Outpatient Medications  Medication Sig Dispense Refill  . acetaminophen (TYLENOL) 500 MG tablet Take 1 tablet (500 mg total) by mouth every 6 (six) hours as needed for mild pain or headache. 30 tablet 0  . atorvastatin (LIPITOR) 80 MG tablet Take 80 mg by mouth every evening.     . Cholecalciferol (VITAMIN D3) 50 MCG (2000  UT) TABS Take 2,000 Units by mouth daily.    . Cyanocobalamin (B-12) 2500 MCG TABS Take 2,500 mcg by mouth daily.    . DROPLET INSULIN SYRINGE 31G X 5/16" 1 ML MISC     . ELIQUIS 5 MG TABS tablet TAKE 1 TABLET TWICE DAILY 725 tablet 3  . folic acid (FOLVITE) 1 MG tablet Take 1 tablet (1 mg total) by mouth daily. 30 tablet 1  . furosemide (LASIX) 80 MG tablet Take 80 mg by mouth See admin instructions. Take 80 mg by mouth in the morning and between 5-6 PM daily    . insulin glargine (LANTUS) 100 UNIT/ML injection Inject 30-40 Units into the skin See admin instructions. Inject 30 units into the skin in the morning and 40 units at bedtime    . ipratropium-albuterol (DUONEB) 0.5-2.5 (3) MG/3ML SOLN Take 3 mLs by nebulization every evening.     . isosorbide mononitrate (IMDUR) 30 MG 24 hr tablet Take 1 tablet (30 mg total) by mouth in the morning and at bedtime. 180 tablet 3  . lisinopril (ZESTRIL) 5 MG tablet Take 0.5 tablets (2.5 mg total) by mouth daily.    . meclizine (ANTIVERT) 25 MG tablet Take 25 mg by mouth daily as needed for dizziness.    . metFORMIN (GLUCOPHAGE) 1000  MG tablet Take 1,000 mg by mouth 2 (two) times daily.    . nitroGLYCERIN (NITROSTAT) 0.4 MG SL tablet PLACE 1 TABLET (0.4 MG TOTAL) UNDER THE TONGUE EVERY 5  MINUTES AS NEEDED FOR CHEST PAIN. (Patient taking differently: Place 0.4 mg under the tongue every 5 (five) minutes as needed for chest pain. ) 25 tablet 3  . OXYGEN Inhale 2 L/min into the lungs See admin instructions. 2 L/min of oxygen at bedtime and during the day as needed for shortness of breath    . pantoprazole (PROTONIX) 40 MG tablet Take 1 tablet (40 mg total) by mouth 2 (two) times daily. To protect stomach while taking multiple blood thinners. 60 tablet 2  . potassium chloride SA (K-DUR) 20 MEQ tablet Take 1 tablet (20 mEq total) by mouth daily. 30 tablet 1  . psyllium (METAMUCIL) 58.6 % packet Take 0.5 packets by mouth 2 (two) times daily. [place]    .  SPIRIVA HANDIHALER 18 MCG inhalation capsule Place 1 capsule into inhaler and inhale daily after breakfast.     . traMADol (ULTRAM) 50 MG tablet Take 1 tablet (50 mg total) by mouth every 8 (eight) hours as needed. (Patient taking differently: Take 50 mg by mouth every 8 (eight) hours as needed (for pain). ) 20 tablet 0  . traZODone (DESYREL) 100 MG tablet Take 100-200 mg by mouth at bedtime.     . TRUE METRIX BLOOD GLUCOSE TEST test strip      No current facility-administered medications for this visit.     ROS:  See HPI  Physical Exam:  Today's Vitals   03/02/20 1329  BP: 110/67  Pulse: 75  Resp: 20  Temp: 98.1 F (36.7 C)  TempSrc: Temporal  SpO2: 97%  Weight: 180 lb 12.8 oz (82 kg)  Height: 5\' 7"  (1.702 m)  PainSc: 5   PainLoc: Ankle   Body mass index is 28.32 kg/m.   Incision:        Extremities:  Brisk doppler signals right DP/PT/peroneal   Assessment/Plan:  This is a 86 y.o. male who is s/p: arteriogram on 02/04/2020, which revealed no hemodynamically significant lesions.  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 Trula Slade.     -wound is clean - area medially is soft and not bone exposure. He has excellent doppler signals in the right DP/PT/peroneal.    -pt has not had any fevers and overall doing well.  -unfortunately, pt fell in his Darco shoe.  Dr. Trula Slade discussed with pt that he really needs to continue to wear this until the wound is healed.   -pt with anemia and pt states they cannot determine cause.  He is on Eliquis for afib and also Plavix.  Per Dr. Trula Slade, pt can discontinue Plavix from vascular standpoint.   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  Given he will be back in 4 weeks to see Dr. Trula Slade, we can get this scheduled at that time  Leontine Locket, [facility] [phone]  Clinic MD:  Lendell Caprice

## 2020-03-03 DIAGNOSIS — E1169 Type 2 diabetes mellitus with other specified complication: Secondary | ICD-10-CM | POA: Diagnosis not present

## 2020-03-03 DIAGNOSIS — I5033 Acute on chronic diastolic (congestive) heart failure: Secondary | ICD-10-CM | POA: Diagnosis not present

## 2020-03-03 DIAGNOSIS — I130 Hypertensive heart and chronic kidney disease with heart failure and stage 1 through stage 4 chronic kidney disease, or unspecified chronic kidney disease: Secondary | ICD-10-CM | POA: Diagnosis not present

## 2020-03-03 DIAGNOSIS — D631 Anemia in chronic kidney disease: Secondary | ICD-10-CM | POA: Diagnosis not present

## 2020-03-03 DIAGNOSIS — N1831 Chronic kidney disease, stage 3a: Secondary | ICD-10-CM | POA: Diagnosis not present

## 2020-03-03 DIAGNOSIS — M869 Osteomyelitis, unspecified: Secondary | ICD-10-CM | POA: Diagnosis not present

## 2020-03-03 DIAGNOSIS — E1122 Type 2 diabetes mellitus with diabetic chronic kidney disease: Secondary | ICD-10-CM | POA: Diagnosis not present

## 2020-03-03 DIAGNOSIS — T8743 Infection of amputation stump, right lower extremity: Secondary | ICD-10-CM | POA: Diagnosis not present

## 2020-03-03 DIAGNOSIS — L03115 Cellulitis of right lower limb: Secondary | ICD-10-CM | POA: Diagnosis not present

## 2020-03-05 DIAGNOSIS — I130 Hypertensive heart and chronic kidney disease with heart failure and stage 1 through stage 4 chronic kidney disease, or unspecified chronic kidney disease: Secondary | ICD-10-CM | POA: Diagnosis not present

## 2020-03-05 DIAGNOSIS — E1169 Type 2 diabetes mellitus with other specified complication: Secondary | ICD-10-CM | POA: Diagnosis not present

## 2020-03-05 DIAGNOSIS — E1122 Type 2 diabetes mellitus with diabetic chronic kidney disease: Secondary | ICD-10-CM | POA: Diagnosis not present

## 2020-03-05 DIAGNOSIS — L03115 Cellulitis of right lower limb: Secondary | ICD-10-CM | POA: Diagnosis not present

## 2020-03-05 DIAGNOSIS — D631 Anemia in chronic kidney disease: Secondary | ICD-10-CM | POA: Diagnosis not present

## 2020-03-05 DIAGNOSIS — M869 Osteomyelitis, unspecified: Secondary | ICD-10-CM | POA: Diagnosis not present

## 2020-03-05 DIAGNOSIS — I5033 Acute on chronic diastolic (congestive) heart failure: Secondary | ICD-10-CM | POA: Diagnosis not present

## 2020-03-05 DIAGNOSIS — N1831 Chronic kidney disease, stage 3a: Secondary | ICD-10-CM | POA: Diagnosis not present

## 2020-03-05 DIAGNOSIS — T8743 Infection of amputation stump, right lower extremity: Secondary | ICD-10-CM | POA: Diagnosis not present

## 2020-03-10 ENCOUNTER — Other Ambulatory Visit: Payer: Self-pay

## 2020-03-10 NOTE — Patient Outreach (Signed)
[facility]) Care Management  [place]  03/10/2020   JOANDRY SLAGTER 09/22/1933 [id number]  Subjective: Telephone call to patient for follow up.  He reports doing good.  Right foot wound continues to heal.  Discussed signs of infection and when to notify physician.  Also discussed heart failure management.  He verbalized understanding.   Objective:   Encounter Medications:  Outpatient Encounter Medications as of 03/10/2020  Medication Sig Note  . Accu-Chek Softclix Lancets lancets    . acetaminophen (TYLENOL) 500 MG tablet Take 1 tablet (500 mg total) by mouth every 6 (six) hours as needed for mild pain or headache.   . Alcohol Swabs (B-D SINGLE USE SWABS REGULAR) PADS    . atorvastatin (LIPITOR) 80 MG tablet Take 80 mg by mouth every evening.    . Blood Glucose Monitoring Suppl (ACCU-CHEK GUIDE) w/Device KIT    . Cholecalciferol (VITAMIN D3) 50 MCG (2000 UT) TABS Take 2,000 Units by mouth daily.   . clopidogrel (PLAVIX) 75 MG tablet Take 75 mg by mouth daily.   . Cyanocobalamin (B-12) 2500 MCG TABS Take 2,500 mcg by mouth daily.   . DROPLET INSULIN SYRINGE 31G X 5/16" 1 ML MISC    . ELIQUIS 5 MG TABS tablet TAKE 1 TABLET TWICE DAILY   . folic acid (FOLVITE) 1 MG tablet Take 1 tablet (1 mg total) by mouth daily.   . furosemide (LASIX) 80 MG tablet Take 80 mg by mouth See admin instructions. Take 80 mg by mouth in the morning and between 5-6 PM daily   . insulin glargine (LANTUS) 100 UNIT/ML injection Inject 30-40 Units into the skin See admin instructions. Inject 30 units into the skin in the morning and 40 units at bedtime 02/02/2020: Regimen confirmed to be accurate by the patient  . ipratropium-albuterol (DUONEB) 0.5-2.5 (3) MG/3ML SOLN Take 3 mLs by nebulization every evening.    . isosorbide mononitrate (IMDUR) 30 MG 24 hr tablet Take 1 tablet (30 mg total) by mouth in the morning and at bedtime.   [name] lisinopril (ZESTRIL) 5 MG tablet Take 0.5 tablets (2.5  mg total) by mouth daily.   . meclizine (ANTIVERT) 25 MG tablet Take 25 mg by mouth daily as needed for dizziness.   . metFORMIN (GLUCOPHAGE) 1000 MG tablet Take 1,000 mg by mouth 2 (two) times daily.   . nitroGLYCERIN (NITROSTAT) 0.4 MG SL tablet PLACE 1 TABLET (0.4 MG TOTAL) UNDER THE TONGUE EVERY 5  MINUTES AS NEEDED FOR CHEST PAIN. (Patient taking differently: Place 0.4 mg under the tongue every 5 (five) minutes as needed for chest pain. )   . OXYGEN Inhale 2 L/min into the lungs See admin instructions. 2 L/min of oxygen at bedtime and during the day as needed for shortness of breath   . pantoprazole (PROTONIX) 40 MG tablet Take 1 tablet (40 mg total) by mouth 2 (two) times daily. To protect stomach while taking multiple blood thinners.   . potassium chloride SA (K-DUR) 20 MEQ tablet Take 1 tablet (20 mEq total) by mouth daily.   . psyllium (METAMUCIL) 58.6 % packet Take 0.5 packets by mouth 2 (two) times daily. [place]   . SPIRIVA HANDIHALER 18 MCG inhalation capsule Place 1 capsule into inhaler and inhale daily after breakfast.    . traMADol (ULTRAM) 50 MG tablet Take 1 tablet (50 mg total) by mouth every 8 (eight) hours as needed. (Patient taking differently: Take 50 mg by mouth every 8 (eight)  hours as needed (for pain). )   . traZODone (DESYREL) 100 MG tablet Take 100-200 mg by mouth at bedtime.    [name] METRIX BLOOD GLUCOSE TEST test strip     No facility-administered encounter medications on file as of 03/10/2020.    Functional Status:  In your present state of health, do you have any difficulty performing the following activities: 02/10/2020 02/03/2020  Hearing? N N  Vision? N N  Comment - -  Difficulty concentrating or making decisions? N N  Walking or climbing stairs? Y Y  Comment weakness and amputation -  Dressing or bathing? N N  Doing errands, shopping? N N  Preparing Food and eating ? N -  Using the Toilet? N -  In the past six months, have you accidently leaked  urine? Y -  Comment wears depends -  Do you have problems with loss of bowel control? N -  Managing your Medications? N -  Managing your Finances? N -  Housekeeping or managing your Housekeeping? Y -  Comment wife assists -  Some recent data might be hidden    Fall/Depression Screening: Fall Risk  02/10/2020 08/14/2019 07/29/2019  Falls in the past year? 1 1 0  Comment - week ago in the yard no injuries -  Number falls in past yr: 0 0 -  Injury with Fall? 0 0 -  Risk for fall due to : - - -  Follow up - Falls prevention discussed -   PHQ 2/9 Scores 02/10/2020 11/07/2019 07/29/2019 06/12/2019 12/20/2018  PHQ - 2 Score 0 0 0 0 0    Assessment: Patient right foot surgical site continues to heal no signs of infection. Patient managing chronic conditions.   Goals Addressed              This Visit's Progress   .  THN-Make and Keep All Appointments   On track     Follow Up Date 04/24/20   - ask family or friend for a ride - keep a calendar with appointment dates    Why is this important?   Part of staying healthy is seeing the doctor for follow-up care.  If you forget your appointments, there are some things you can do to stay on track.    Notes:     .  THN-Monitor foot wound for infection (pt-stated)   On track     Follow Up Date 04/24/20   - clean and dry skin well  -Place dressing as ordered   Why is this important?    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    Taking really good care of your skin will help to keep your skin unbroken.    Notes: Patient denies signs of infection.  Sutures removed from wound.  Wet to dry dressings daily.        Plan: RN CM will contact again in the month of December.   Follow-up:  Patient agrees to Care Plan and Follow-up.   [name], RN, MSN [place] [facility] Care Management Coordinator Direct Line [phone] Cell [phone] Toll Free: [phone]  Fax: [phone]

## 2020-03-12 DIAGNOSIS — M869 Osteomyelitis, unspecified: Secondary | ICD-10-CM | POA: Diagnosis not present

## 2020-03-12 DIAGNOSIS — L03115 Cellulitis of right lower limb: Secondary | ICD-10-CM | POA: Diagnosis not present

## 2020-03-12 DIAGNOSIS — T8743 Infection of amputation stump, right lower extremity: Secondary | ICD-10-CM | POA: Diagnosis not present

## 2020-03-12 DIAGNOSIS — I130 Hypertensive heart and chronic kidney disease with heart failure and stage 1 through stage 4 chronic kidney disease, or unspecified chronic kidney disease: Secondary | ICD-10-CM | POA: Diagnosis not present

## 2020-03-12 DIAGNOSIS — D631 Anemia in chronic kidney disease: Secondary | ICD-10-CM | POA: Diagnosis not present

## 2020-03-12 DIAGNOSIS — E1169 Type 2 diabetes mellitus with other specified complication: Secondary | ICD-10-CM | POA: Diagnosis not present

## 2020-03-12 DIAGNOSIS — N1831 Chronic kidney disease, stage 3a: Secondary | ICD-10-CM | POA: Diagnosis not present

## 2020-03-12 DIAGNOSIS — I5033 Acute on chronic diastolic (congestive) heart failure: Secondary | ICD-10-CM | POA: Diagnosis not present

## 2020-03-12 DIAGNOSIS — E1122 Type 2 diabetes mellitus with diabetic chronic kidney disease: Secondary | ICD-10-CM | POA: Diagnosis not present

## 2020-03-13 DIAGNOSIS — I130 Hypertensive heart and chronic kidney disease with heart failure and stage 1 through stage 4 chronic kidney disease, or unspecified chronic kidney disease: Secondary | ICD-10-CM | POA: Diagnosis not present

## 2020-03-13 DIAGNOSIS — L03115 Cellulitis of right lower limb: Secondary | ICD-10-CM | POA: Diagnosis not present

## 2020-03-13 DIAGNOSIS — E1122 Type 2 diabetes mellitus with diabetic chronic kidney disease: Secondary | ICD-10-CM | POA: Diagnosis not present

## 2020-03-13 DIAGNOSIS — D631 Anemia in chronic kidney disease: Secondary | ICD-10-CM | POA: Diagnosis not present

## 2020-03-13 DIAGNOSIS — E1169 Type 2 diabetes mellitus with other specified complication: Secondary | ICD-10-CM | POA: Diagnosis not present

## 2020-03-13 DIAGNOSIS — M869 Osteomyelitis, unspecified: Secondary | ICD-10-CM | POA: Diagnosis not present

## 2020-03-13 DIAGNOSIS — N1831 Chronic kidney disease, stage 3a: Secondary | ICD-10-CM | POA: Diagnosis not present

## 2020-03-13 DIAGNOSIS — T8743 Infection of amputation stump, right lower extremity: Secondary | ICD-10-CM | POA: Diagnosis not present

## 2020-03-13 DIAGNOSIS — I5033 Acute on chronic diastolic (congestive) heart failure: Secondary | ICD-10-CM | POA: Diagnosis not present

## 2020-03-16 DIAGNOSIS — I5033 Acute on chronic diastolic (congestive) heart failure: Secondary | ICD-10-CM | POA: Diagnosis not present

## 2020-03-16 DIAGNOSIS — E1122 Type 2 diabetes mellitus with diabetic chronic kidney disease: Secondary | ICD-10-CM | POA: Diagnosis not present

## 2020-03-16 DIAGNOSIS — T8743 Infection of amputation stump, right lower extremity: Secondary | ICD-10-CM | POA: Diagnosis not present

## 2020-03-16 DIAGNOSIS — D631 Anemia in chronic kidney disease: Secondary | ICD-10-CM | POA: Diagnosis not present

## 2020-03-16 DIAGNOSIS — L03115 Cellulitis of right lower limb: Secondary | ICD-10-CM | POA: Diagnosis not present

## 2020-03-16 DIAGNOSIS — I130 Hypertensive heart and chronic kidney disease with heart failure and stage 1 through stage 4 chronic kidney disease, or unspecified chronic kidney disease: Secondary | ICD-10-CM | POA: Diagnosis not present

## 2020-03-16 DIAGNOSIS — E1169 Type 2 diabetes mellitus with other specified complication: Secondary | ICD-10-CM | POA: Diagnosis not present

## 2020-03-16 DIAGNOSIS — M869 Osteomyelitis, unspecified: Secondary | ICD-10-CM | POA: Diagnosis not present

## 2020-03-16 DIAGNOSIS — N1831 Chronic kidney disease, stage 3a: Secondary | ICD-10-CM | POA: Diagnosis not present

## 2020-03-20 DIAGNOSIS — E1122 Type 2 diabetes mellitus with diabetic chronic kidney disease: Secondary | ICD-10-CM | POA: Diagnosis not present

## 2020-03-20 DIAGNOSIS — I130 Hypertensive heart and chronic kidney disease with heart failure and stage 1 through stage 4 chronic kidney disease, or unspecified chronic kidney disease: Secondary | ICD-10-CM | POA: Diagnosis not present

## 2020-03-20 DIAGNOSIS — T8743 Infection of amputation stump, right lower extremity: Secondary | ICD-10-CM | POA: Diagnosis not present

## 2020-03-20 DIAGNOSIS — N1831 Chronic kidney disease, stage 3a: Secondary | ICD-10-CM | POA: Diagnosis not present

## 2020-03-20 DIAGNOSIS — L03115 Cellulitis of right lower limb: Secondary | ICD-10-CM | POA: Diagnosis not present

## 2020-03-20 DIAGNOSIS — M869 Osteomyelitis, unspecified: Secondary | ICD-10-CM | POA: Diagnosis not present

## 2020-03-20 DIAGNOSIS — I5033 Acute on chronic diastolic (congestive) heart failure: Secondary | ICD-10-CM | POA: Diagnosis not present

## 2020-03-20 DIAGNOSIS — D631 Anemia in chronic kidney disease: Secondary | ICD-10-CM | POA: Diagnosis not present

## 2020-03-20 DIAGNOSIS — E1169 Type 2 diabetes mellitus with other specified complication: Secondary | ICD-10-CM | POA: Diagnosis not present

## 2020-03-23 DIAGNOSIS — E1169 Type 2 diabetes mellitus with other specified complication: Secondary | ICD-10-CM | POA: Diagnosis not present

## 2020-03-23 DIAGNOSIS — M869 Osteomyelitis, unspecified: Secondary | ICD-10-CM | POA: Diagnosis not present

## 2020-03-23 DIAGNOSIS — E1122 Type 2 diabetes mellitus with diabetic chronic kidney disease: Secondary | ICD-10-CM | POA: Diagnosis not present

## 2020-03-23 DIAGNOSIS — N1831 Chronic kidney disease, stage 3a: Secondary | ICD-10-CM | POA: Diagnosis not present

## 2020-03-23 DIAGNOSIS — T8743 Infection of amputation stump, right lower extremity: Secondary | ICD-10-CM | POA: Diagnosis not present

## 2020-03-23 DIAGNOSIS — L03115 Cellulitis of right lower limb: Secondary | ICD-10-CM | POA: Diagnosis not present

## 2020-03-23 DIAGNOSIS — D631 Anemia in chronic kidney disease: Secondary | ICD-10-CM | POA: Diagnosis not present

## 2020-03-23 DIAGNOSIS — I509 Heart failure, unspecified: Secondary | ICD-10-CM | POA: Diagnosis not present

## 2020-03-23 DIAGNOSIS — I130 Hypertensive heart and chronic kidney disease with heart failure and stage 1 through stage 4 chronic kidney disease, or unspecified chronic kidney disease: Secondary | ICD-10-CM | POA: Diagnosis not present

## 2020-03-23 DIAGNOSIS — I5033 Acute on chronic diastolic (congestive) heart failure: Secondary | ICD-10-CM | POA: Diagnosis not present

## 2020-03-24 ENCOUNTER — Other Ambulatory Visit: Payer: Self-pay

## 2020-03-24 NOTE — Patient Instructions (Signed)
Goals Addressed              This Visit's Progress   .  THN-Make and Keep All Appointments   On track     Timeframe:  Long-Range Goal Priority:  High Start Date:  03/24/20                           Expected End Date:   04/27/20                   Follow Up Date 04/27/20   - ask family or friend for a ride - keep a calendar with appointment dates    Why is this important?   Part of staying healthy is seeing the doctor for follow-up care.  If you forget your appointments, there are some things you can do to stay on track.    Notes:     .  THN-Monitor foot wound for infection (pt-stated)   On track     Timeframe:  Short-Term Goal Priority:  High Start Date:   03/24/20                          Expected End Date:   04/27/20                   Follow Up Date 04/27/20   - clean and dry skin well  -Place dressing as ordered   Why is this important?    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    Taking really good care of your skin will help to keep your skin unbroken.    Notes: Patient denies signs of infection.  Wet to dry dressings daily. Wound closing up slowly.  Home health nurse 2/week.    .  THN-Track and Manage Symptoms   On track     Timeframe:  Long-Range Goal Priority:  High Start Date:  03/24/20                           Expected End Date:  04/27/20                    Follow Up Date 04/27/20   - eat more whole grains, fruits and vegetables, lean meats and healthy fats - follow rescue plan if symptoms flare-up - know when to call the doctor - track symptoms and what helps feel better or worse    Why is this important?   You will be able to handle your symptoms better if you keep track of them.  Making some simple changes to your lifestyle will help.  Eating healthy is one thing you can do to take good care of yourself.    Notes:

## 2020-03-24 NOTE — Patient Outreach (Signed)
[facility]) Care Management  [place]  03/24/2020   Mike Wright Jan 28, 1934 [id number]  Subjective: Telephone call to patient for follow up. Patient reports he is doing good.  He states right foot wound improving and closing up. Denies signs of infection.  Reviewed signs of infection and when to contact physician. Home health continues twice a week. Patient weight today 178 lbs. Discussed continued heart failure management.  He verbalized understanding.   Objective:   Encounter Medications:  Outpatient Encounter Medications as of 03/24/2020  Medication Sig Note  . Accu-Chek Softclix Lancets lancets    . acetaminophen (TYLENOL) 500 MG tablet Take 1 tablet (500 mg total) by mouth every 6 (six) hours as needed for mild pain or headache.   . Alcohol Swabs (B-D SINGLE USE SWABS REGULAR) PADS    . atorvastatin (LIPITOR) 80 MG tablet Take 80 mg by mouth every evening.    . Blood Glucose Monitoring Suppl (ACCU-CHEK GUIDE) w/Device KIT    . Cholecalciferol (VITAMIN D3) 50 MCG (2000 UT) TABS Take 2,000 Units by mouth daily.   . clopidogrel (PLAVIX) 75 MG tablet Take 75 mg by mouth daily.   . Cyanocobalamin (B-12) 2500 MCG TABS Take 2,500 mcg by mouth daily.   . DROPLET INSULIN SYRINGE 31G X 5/16" 1 ML MISC    . ELIQUIS 5 MG TABS tablet TAKE 1 TABLET TWICE DAILY   . folic acid (FOLVITE) 1 MG tablet Take 1 tablet (1 mg total) by mouth daily.   . furosemide (LASIX) 80 MG tablet Take 80 mg by mouth See admin instructions. Take 80 mg by mouth in the morning and between 5-6 PM daily   . insulin glargine (LANTUS) 100 UNIT/ML injection Inject 30-40 Units into the skin See admin instructions. Inject 30 units into the skin in the morning and 40 units at bedtime 02/02/2020: Regimen confirmed to be accurate by the patient  . ipratropium-albuterol (DUONEB) 0.5-2.5 (3) MG/3ML SOLN Take 3 mLs by nebulization every evening.    . isosorbide mononitrate (IMDUR) 30 MG 24 hr tablet Take  1 tablet (30 mg total) by mouth in the morning and at bedtime.   [name] lisinopril (ZESTRIL) 5 MG tablet Take 0.5 tablets (2.5 mg total) by mouth daily.   . meclizine (ANTIVERT) 25 MG tablet Take 25 mg by mouth daily as needed for dizziness.   . metFORMIN (GLUCOPHAGE) 1000 MG tablet Take 1,000 mg by mouth 2 (two) times daily.   . nitroGLYCERIN (NITROSTAT) 0.4 MG SL tablet PLACE 1 TABLET (0.4 MG TOTAL) UNDER THE TONGUE EVERY 5  MINUTES AS NEEDED FOR CHEST PAIN. (Patient taking differently: Place 0.4 mg under the tongue every 5 (five) minutes as needed for chest pain. )   . OXYGEN Inhale 2 L/min into the lungs See admin instructions. 2 L/min of oxygen at bedtime and during the day as needed for shortness of breath   . pantoprazole (PROTONIX) 40 MG tablet Take 1 tablet (40 mg total) by mouth 2 (two) times daily. To protect stomach while taking multiple blood thinners.   . potassium chloride SA (K-DUR) 20 MEQ tablet Take 1 tablet (20 mEq total) by mouth daily.   . psyllium (METAMUCIL) 58.6 % packet Take 0.5 packets by mouth 2 (two) times daily. [place]   . SPIRIVA HANDIHALER 18 MCG inhalation capsule Place 1 capsule into inhaler and inhale daily after breakfast.    . traMADol (ULTRAM) 50 MG tablet Take 1 tablet (50 mg total) by  mouth every 8 (eight) hours as needed. (Patient taking differently: Take 50 mg by mouth every 8 (eight) hours as needed (for pain). )   . traZODone (DESYREL) 100 MG tablet Take 100-200 mg by mouth at bedtime.    [name] METRIX BLOOD GLUCOSE TEST test strip     No facility-administered encounter medications on file as of 03/24/2020.    Functional Status:  In your present state of health, do you have any difficulty performing the following activities: 02/10/2020 02/03/2020  Hearing? N N  Vision? N N  Comment - -  Difficulty concentrating or making decisions? N N  Walking or climbing stairs? Y Y  Comment weakness and amputation -  Dressing or bathing? N N  Doing errands,  shopping? N N  Preparing Food and eating ? N -  Using the Toilet? N -  In the past six months, have you accidently leaked urine? Y -  Comment wears depends -  Do you have problems with loss of bowel control? N -  Managing your Medications? N -  Managing your Finances? N -  Housekeeping or managing your Housekeeping? Y -  Comment wife assists -  Some recent data might be hidden    Fall/Depression Screening: Fall Risk  03/10/2020 02/10/2020 08/14/2019  Falls in the past year? 1 1 1   Comment - - week ago in the yard no injuries  Number falls in past yr: 1 0 0  Injury with Fall? 0 0 0  Risk for fall due to : Orthopedic patient - -  Follow up Falls prevention discussed - Falls prevention discussed   PHQ 2/9 Scores 02/10/2020 11/07/2019 07/29/2019 06/12/2019 12/20/2018  PHQ - 2 Score 0 0 0 0 0    Assessment: Patient wound continues to heal. Patient managing other chronic illnesses as well with assistance of his spouse.   Goals Addressed              This Visit's Progress   .  THN-Make and Keep All Appointments   On track     Timeframe:  Long-Range Goal Priority:  High Start Date:  03/24/20                           Expected End Date:   04/27/20                   Follow Up Date 04/27/20   - ask family or friend for a ride - keep a calendar with appointment dates    Why is this important?   Part of staying healthy is seeing the doctor for follow-up care.  If you forget your appointments, there are some things you can do to stay on track.    Notes:     .  THN-Monitor foot wound for infection (pt-stated)   On track     Timeframe:  Short-Term Goal Priority:  High Start Date:   03/24/20                          Expected End Date:   04/27/20                   Follow Up Date 04/27/20   - clean and dry skin well  -Place dressing as ordered   Why is this important?    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    Taking really  good care of your skin will help to  keep your skin unbroken.    Notes: Patient denies signs of infection.  Wet to dry dressings daily. Wound closing up slowly.  Home health nurse 2/week.    .  THN-Track and Manage Symptoms   On track     Timeframe:  Long-Range Goal Priority:  High Start Date:  03/24/20                           Expected End Date:  04/27/20                    Follow Up Date 04/27/20   - eat more whole grains, fruits and vegetables, lean meats and healthy fats - follow rescue plan if symptoms flare-up - know when to call the doctor - track symptoms and what helps feel better or worse    Why is this important?   You will be able to handle your symptoms better if you keep track of them.  Making some simple changes to your lifestyle will help.  Eating healthy is one thing you can do to take good care of yourself.    Notes:        Plan: RN CM will contact patient in the month of January and patient agreeable.   Follow-up:  Patient agrees to Care Plan and Follow-up.  [name], RN, MSN [place] [facility] Care Management Coordinator Direct Line [phone] Cell [phone] Toll Free: [phone]  Fax: [phone]

## 2020-03-25 ENCOUNTER — Ambulatory Visit: Payer: Self-pay

## 2020-03-26 DIAGNOSIS — E1169 Type 2 diabetes mellitus with other specified complication: Secondary | ICD-10-CM | POA: Diagnosis not present

## 2020-03-26 DIAGNOSIS — L03115 Cellulitis of right lower limb: Secondary | ICD-10-CM | POA: Diagnosis not present

## 2020-03-26 DIAGNOSIS — D631 Anemia in chronic kidney disease: Secondary | ICD-10-CM | POA: Diagnosis not present

## 2020-03-26 DIAGNOSIS — M869 Osteomyelitis, unspecified: Secondary | ICD-10-CM | POA: Diagnosis not present

## 2020-03-26 DIAGNOSIS — T8743 Infection of amputation stump, right lower extremity: Secondary | ICD-10-CM | POA: Diagnosis not present

## 2020-03-26 DIAGNOSIS — I5033 Acute on chronic diastolic (congestive) heart failure: Secondary | ICD-10-CM | POA: Diagnosis not present

## 2020-03-26 DIAGNOSIS — I130 Hypertensive heart and chronic kidney disease with heart failure and stage 1 through stage 4 chronic kidney disease, or unspecified chronic kidney disease: Secondary | ICD-10-CM | POA: Diagnosis not present

## 2020-03-26 DIAGNOSIS — E1122 Type 2 diabetes mellitus with diabetic chronic kidney disease: Secondary | ICD-10-CM | POA: Diagnosis not present

## 2020-03-26 DIAGNOSIS — N1831 Chronic kidney disease, stage 3a: Secondary | ICD-10-CM | POA: Diagnosis not present

## 2020-03-30 ENCOUNTER — Ambulatory Visit: Payer: Medicare HMO | Admitting: Surgery

## 2020-03-31 DIAGNOSIS — I130 Hypertensive heart and chronic kidney disease with heart failure and stage 1 through stage 4 chronic kidney disease, or unspecified chronic kidney disease: Secondary | ICD-10-CM | POA: Diagnosis not present

## 2020-03-31 DIAGNOSIS — T8743 Infection of amputation stump, right lower extremity: Secondary | ICD-10-CM | POA: Diagnosis not present

## 2020-03-31 DIAGNOSIS — N1831 Chronic kidney disease, stage 3a: Secondary | ICD-10-CM | POA: Diagnosis not present

## 2020-03-31 DIAGNOSIS — L03115 Cellulitis of right lower limb: Secondary | ICD-10-CM | POA: Diagnosis not present

## 2020-03-31 DIAGNOSIS — I5033 Acute on chronic diastolic (congestive) heart failure: Secondary | ICD-10-CM | POA: Diagnosis not present

## 2020-03-31 DIAGNOSIS — E1122 Type 2 diabetes mellitus with diabetic chronic kidney disease: Secondary | ICD-10-CM | POA: Diagnosis not present

## 2020-03-31 DIAGNOSIS — M869 Osteomyelitis, unspecified: Secondary | ICD-10-CM | POA: Diagnosis not present

## 2020-03-31 DIAGNOSIS — D631 Anemia in chronic kidney disease: Secondary | ICD-10-CM | POA: Diagnosis not present

## 2020-03-31 DIAGNOSIS — E1169 Type 2 diabetes mellitus with other specified complication: Secondary | ICD-10-CM | POA: Diagnosis not present

## 2020-04-02 ENCOUNTER — Other Ambulatory Visit: Payer: Self-pay

## 2020-04-02 ENCOUNTER — Ambulatory Visit (INDEPENDENT_AMBULATORY_CARE_PROVIDER_SITE_OTHER): Payer: Self-pay | Admitting: Physician Assistant

## 2020-04-02 ENCOUNTER — Telehealth: Payer: Self-pay

## 2020-04-02 VITALS — BP 131/65 | HR 82 | Temp 97.9°F | Resp 18 | Ht 67.0 in | Wt 184.1 lb

## 2020-04-02 DIAGNOSIS — Z89431 Acquired absence of right foot: Secondary | ICD-10-CM

## 2020-04-02 DIAGNOSIS — L03115 Cellulitis of right lower limb: Secondary | ICD-10-CM | POA: Diagnosis not present

## 2020-04-02 DIAGNOSIS — M869 Osteomyelitis, unspecified: Secondary | ICD-10-CM | POA: Diagnosis not present

## 2020-04-02 DIAGNOSIS — E1122 Type 2 diabetes mellitus with diabetic chronic kidney disease: Secondary | ICD-10-CM | POA: Diagnosis not present

## 2020-04-02 DIAGNOSIS — I130 Hypertensive heart and chronic kidney disease with heart failure and stage 1 through stage 4 chronic kidney disease, or unspecified chronic kidney disease: Secondary | ICD-10-CM | POA: Diagnosis not present

## 2020-04-02 DIAGNOSIS — E1169 Type 2 diabetes mellitus with other specified complication: Secondary | ICD-10-CM | POA: Diagnosis not present

## 2020-04-02 DIAGNOSIS — I5033 Acute on chronic diastolic (congestive) heart failure: Secondary | ICD-10-CM | POA: Diagnosis not present

## 2020-04-02 DIAGNOSIS — N1831 Chronic kidney disease, stage 3a: Secondary | ICD-10-CM | POA: Diagnosis not present

## 2020-04-02 DIAGNOSIS — D631 Anemia in chronic kidney disease: Secondary | ICD-10-CM | POA: Diagnosis not present

## 2020-04-02 DIAGNOSIS — T8743 Infection of amputation stump, right lower extremity: Secondary | ICD-10-CM | POA: Diagnosis not present

## 2020-04-02 NOTE — Telephone Encounter (Signed)
Nurse called to report new malodorous drainage from s/p TMA site (11/21). On Tuesday there was no odor. The L side is covered in black eschar and the right is sloughing yellow tissue. Middle of the wound looks okay. There is some redness and swelling. Placed patient on PA schedule today for evaluation.

## 2020-04-02 NOTE — Progress Notes (Signed)
POST OPERATIVE OFFICE NOTE    CC:  F/u for surgery  HPI:  This is a 87 y.o. 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 Myra Gianotti.    Pt returns today for follow up.  He is here today with concerns about the medial and lateral corners of the incision.  He has been using wet to dry dressing changes.  He denise fever and chills.    Allergies  Allergen Reactions  . Bactrim [Sulfamethoxazole-Trimethoprim] Other (See Comments)    Pancytopenia  . Codeine Shortness Of Breath  . Doxycycline Swelling    Swelling and numbness in lips and face. Swelling improved after stopping. Reports still experiences numbness in bottom lip.   Duncan Dull [Ferumoxytol] Other (See Comments)    Diaphoretic, chest pain  . Heparin Other (See Comments)    +HIT,  Severe bleeding (with heparin drip & large doses), tolerates low doses  . Losartan Swelling  . Oxycodone Other (See Comments)    "Made me act out of my mind" Mental status changes- hallucinations  . Latex Rash    Current Outpatient Medications  Medication Sig Dispense Refill  . Accu-Chek Softclix Lancets lancets     . acetaminophen (TYLENOL) 500 MG tablet Take 1 tablet (500 mg total) by mouth every 6 (six) hours as needed for mild pain or headache. 30 tablet 0  . Alcohol Swabs (B-D SINGLE USE SWABS REGULAR) PADS     . atorvastatin (LIPITOR) 80 MG tablet Take 80 mg by mouth every evening.     . Blood Glucose Monitoring Suppl (ACCU-CHEK GUIDE) w/Device KIT     . Cholecalciferol (VITAMIN D3) 50 MCG (2000 UT) TABS Take 2,000 Units by mouth daily.    . clopidogrel (PLAVIX) 75 MG tablet Take 75 mg by mouth daily.    . Cyanocobalamin (B-12) 2500 MCG TABS Take 2,500 mcg by mouth daily.    . DROPLET INSULIN SYRINGE 31G X 5/16" 1 ML MISC     . ELIQUIS 5 MG TABS tablet TAKE 1 TABLET TWICE DAILY 180 tablet 3  . folic acid (FOLVITE) 1 MG tablet Take 1 tablet (1 mg total) by mouth daily. 30 tablet 1  . insulin glargine (LANTUS) 100 UNIT/ML  injection Inject 30-40 Units into the skin See admin instructions. Inject 30 units into the skin in the morning and 40 units at bedtime    . ipratropium-albuterol (DUONEB) 0.5-2.5 (3) MG/3ML SOLN Take 3 mLs by nebulization every evening.     . isosorbide mononitrate (IMDUR) 30 MG 24 hr tablet Take 1 tablet (30 mg total) by mouth in the morning and at bedtime. 180 tablet 3  . lisinopril (ZESTRIL) 5 MG tablet Take 0.5 tablets (2.5 mg total) by mouth daily.    . meclizine (ANTIVERT) 25 MG tablet Take 25 mg by mouth daily as needed for dizziness.    . metFORMIN (GLUCOPHAGE) 1000 MG tablet Take 1,000 mg by mouth 2 (two) times daily.    . nitroGLYCERIN (NITROSTAT) 0.4 MG SL tablet PLACE 1 TABLET (0.4 MG TOTAL) UNDER THE TONGUE EVERY 5  MINUTES AS NEEDED FOR CHEST PAIN. (Patient taking differently: Place 0.4 mg under the tongue every 5 (five) minutes as needed for chest pain.) 25 tablet 3  . OXYGEN Inhale 2 L/min into the lungs See admin instructions. 2 L/min of oxygen at bedtime and during the day as needed for shortness of breath    . pantoprazole (PROTONIX) 40 MG tablet Take 1 tablet (40 mg total) by  mouth 2 (two) times daily. To protect stomach while taking multiple blood thinners. 60 tablet 2  . potassium chloride SA (K-DUR) 20 MEQ tablet Take 1 tablet (20 mEq total) by mouth daily. 30 tablet 1  . psyllium (METAMUCIL) 58.6 % packet Take 0.5 packets by mouth 2 (two) times daily. [place]    . SPIRIVA HANDIHALER 18 MCG inhalation capsule Place 1 capsule into inhaler and inhale daily after breakfast.     . traMADol (ULTRAM) 50 MG tablet Take 1 tablet (50 mg total) by mouth every 8 (eight) hours as needed. (Patient taking differently: Take 50 mg by mouth every 8 (eight) hours as needed (for pain).) 20 tablet 0  . traZODone (DESYREL) 100 MG tablet Take 100-200 mg by mouth at bedtime.     . TRUE METRIX BLOOD GLUCOSE TEST test strip     . furosemide (LASIX) 80 MG tablet Take 80 mg by mouth See admin  instructions. Take 80 mg by mouth in the morning and between 5-6 PM daily     No current facility-administered medications for this visit.     ROS:  See HPI  Physical Exam:        Incision:  The central incision is healing nicely.  The medial corner was scabbed over.  I removed the scab and there is viable tissue under the scab.  The lateral corner has fibrinous yellow tissue.  I debrided this area too.  The surrounding skin edges appear healthy with central viable granulation tissue.   Assessment/Plan:  This is a 87 y.o. male who is s/p:TMA right LE Over all the TMA appears viable  They have Santyl at home.  They will use Santyl only on the lateral corner and cont. Saline wet to dry over the rest of the viable tissue.  He will f/u on Monday and we will examine the incision and ask Dr. Trula Slade to look at it as well.    Roxy Horseman PA-C [facility] [phone]  Clinic MD:  Oneida Alar

## 2020-04-03 DIAGNOSIS — I25118 Atherosclerotic heart disease of native coronary artery with other forms of angina pectoris: Secondary | ICD-10-CM | POA: Diagnosis not present

## 2020-04-03 DIAGNOSIS — I5032 Chronic diastolic (congestive) heart failure: Secondary | ICD-10-CM | POA: Diagnosis not present

## 2020-04-03 DIAGNOSIS — E1122 Type 2 diabetes mellitus with diabetic chronic kidney disease: Secondary | ICD-10-CM | POA: Diagnosis not present

## 2020-04-03 DIAGNOSIS — E1142 Type 2 diabetes mellitus with diabetic polyneuropathy: Secondary | ICD-10-CM | POA: Diagnosis not present

## 2020-04-03 DIAGNOSIS — J9611 Chronic respiratory failure with hypoxia: Secondary | ICD-10-CM | POA: Diagnosis not present

## 2020-04-03 DIAGNOSIS — N183 Chronic kidney disease, stage 3 unspecified: Secondary | ICD-10-CM | POA: Diagnosis not present

## 2020-04-03 DIAGNOSIS — D6869 Other thrombophilia: Secondary | ICD-10-CM | POA: Diagnosis not present

## 2020-04-03 DIAGNOSIS — J449 Chronic obstructive pulmonary disease, unspecified: Secondary | ICD-10-CM | POA: Diagnosis not present

## 2020-04-03 DIAGNOSIS — I4891 Unspecified atrial fibrillation: Secondary | ICD-10-CM | POA: Diagnosis not present

## 2020-04-06 ENCOUNTER — Other Ambulatory Visit: Payer: Self-pay

## 2020-04-06 ENCOUNTER — Ambulatory Visit (INDEPENDENT_AMBULATORY_CARE_PROVIDER_SITE_OTHER): Payer: Self-pay | Admitting: Physician Assistant

## 2020-04-06 VITALS — BP 119/62 | HR 81 | Temp 97.4°F | Resp 18 | Ht 67.0 in | Wt 177.0 lb

## 2020-04-06 DIAGNOSIS — Z89411 Acquired absence of right great toe: Secondary | ICD-10-CM

## 2020-04-06 DIAGNOSIS — I739 Peripheral vascular disease, unspecified: Secondary | ICD-10-CM

## 2020-04-06 NOTE — Progress Notes (Signed)
POST OPERATIVE OFFICE NOTE    CC:  F/u for surgery  HPI:  This is a 87 y.o. male who is s/p right TMA on 02/24/2020 by Dr. Trula Slade. His wife is dressing his amp site with wet to dry and some santyl of the lateral aspect of his incision daily. Home health RN visits and dresses his wound twice a week. He wears a Darco shoe. He denies fever or chills  Compliant with statin and Plavix. On Eliquis for a. fib Allergies  Allergen Reactions  . Bactrim [Sulfamethoxazole-Trimethoprim] Other (See Comments)    Pancytopenia  . Codeine Shortness Of Breath  . Doxycycline Swelling    Swelling and numbness in lips and face. Swelling improved after stopping. Reports still experiences numbness in bottom lip.   Shirlean Kelly [Ferumoxytol] Other (See Comments)    Diaphoretic, chest pain  . Heparin Other (See Comments)    +HIT,  Severe bleeding (with heparin drip & large doses), tolerates low doses  . Losartan Swelling  . Oxycodone Other (See Comments)    "Made me act out of my mind" Mental status changes- hallucinations  . Latex Rash    Current Outpatient Medications  Medication Sig Dispense Refill  . Accu-Chek Softclix Lancets lancets     . acetaminophen (TYLENOL) 500 MG tablet Take 1 tablet (500 mg total) by mouth every 6 (six) hours as needed for mild pain or headache. 30 tablet 0  . Alcohol Swabs (B-D SINGLE USE SWABS REGULAR) PADS     . atorvastatin (LIPITOR) 80 MG tablet Take 80 mg by mouth every evening.     . Blood Glucose Monitoring Suppl (ACCU-CHEK GUIDE) w/Device KIT     . Cholecalciferol (VITAMIN D3) 50 MCG (2000 UT) TABS Take 2,000 Units by mouth daily.    . clopidogrel (PLAVIX) 75 MG tablet Take 75 mg by mouth daily.    . Cyanocobalamin (B-12) 2500 MCG TABS Take 2,500 mcg by mouth daily.    . DROPLET INSULIN SYRINGE 31G X 5/16" 1 ML MISC     . ELIQUIS 5 MG TABS tablet TAKE 1 TABLET TWICE DAILY 875 tablet 3  . folic acid (FOLVITE) 1 MG tablet Take 1 tablet (1 mg total) by mouth daily.  30 tablet 1  . furosemide (LASIX) 80 MG tablet Take 80 mg by mouth See admin instructions. Take 80 mg by mouth in the morning and between 5-6 PM daily    . insulin glargine (LANTUS) 100 UNIT/ML injection Inject 30-40 Units into the skin See admin instructions. Inject 30 units into the skin in the morning and 40 units at bedtime    . ipratropium-albuterol (DUONEB) 0.5-2.5 (3) MG/3ML SOLN Take 3 mLs by nebulization every evening.     . isosorbide mononitrate (IMDUR) 30 MG 24 hr tablet Take 1 tablet (30 mg total) by mouth in the morning and at bedtime. 180 tablet 3  . lisinopril (ZESTRIL) 5 MG tablet Take 0.5 tablets (2.5 mg total) by mouth daily.    . meclizine (ANTIVERT) 25 MG tablet Take 25 mg by mouth daily as needed for dizziness.    . metFORMIN (GLUCOPHAGE) 1000 MG tablet Take 1,000 mg by mouth 2 (two) times daily.    . nitroGLYCERIN (NITROSTAT) 0.4 MG SL tablet PLACE 1 TABLET (0.4 MG TOTAL) UNDER THE TONGUE EVERY 5  MINUTES AS NEEDED FOR CHEST PAIN. (Patient taking differently: Place 0.4 mg under the tongue every 5 (five) minutes as needed for chest pain.) 25 tablet 3  . OXYGEN Inhale 2  L/min into the lungs See admin instructions. 2 L/min of oxygen at bedtime and during the day as needed for shortness of breath    . pantoprazole (PROTONIX) 40 MG tablet Take 1 tablet (40 mg total) by mouth 2 (two) times daily. To protect stomach while taking multiple blood thinners. 60 tablet 2  . potassium chloride SA (K-DUR) 20 MEQ tablet Take 1 tablet (20 mEq total) by mouth daily. 30 tablet 1  . psyllium (METAMUCIL) 58.6 % packet Take 0.5 packets by mouth 2 (two) times daily. [place]    . SPIRIVA HANDIHALER 18 MCG inhalation capsule Place 1 capsule into inhaler and inhale daily after breakfast.     . traMADol (ULTRAM) 50 MG tablet Take 1 tablet (50 mg total) by mouth every 8 (eight) hours as needed. (Patient taking differently: Take 50 mg by mouth every 8 (eight) hours as needed (for pain).) 20 tablet 0   . traZODone (DESYREL) 100 MG tablet Take 100-200 mg by mouth at bedtime.     . TRUE METRIX BLOOD GLUCOSE TEST test strip      No current facility-administered medications for this visit.     ROS:  See HPI  Physical Exam:  Vitals:   04/06/20 1421  BP: 119/62  Pulse: 81  Resp: 18  Temp: (!) 97.4 F (36.3 C)  SpO2: 100%    Incision: right TMA amp site with good granualation along exposed incision. Approximately 1 x 1 cm adherent slough at lateral aspect. Entire amp site probed with sterile cotton tipped applicator. No drainage or purulent material. Extremities:  Both feet arm and well perfused. Neuro: A and O times 4  Right foot pre-debridement       Assessment/Plan:  This is a 87 y.o. male who is s/p: right TMA 6 weeks post-op. Dr. Trula Slade sharply debrided some of the devitalized tissue at the lateral aspect of the incision as well as some callous skin along incisional edge. Good bleeding from medial granulation tissue. Bulky dressing applied. Can continue Santyl but will change to dry dressing only now. He has an appointment Feb 7th. Call sooner if needed.  Barbie Banner, PA-C [facility] [phone]  Clinic MD:  Trula Slade

## 2020-04-07 ENCOUNTER — Encounter: Payer: Self-pay | Admitting: Physician Assistant

## 2020-04-07 DIAGNOSIS — M869 Osteomyelitis, unspecified: Secondary | ICD-10-CM | POA: Diagnosis not present

## 2020-04-07 DIAGNOSIS — E1122 Type 2 diabetes mellitus with diabetic chronic kidney disease: Secondary | ICD-10-CM | POA: Diagnosis not present

## 2020-04-07 DIAGNOSIS — E1169 Type 2 diabetes mellitus with other specified complication: Secondary | ICD-10-CM | POA: Diagnosis not present

## 2020-04-07 DIAGNOSIS — T8743 Infection of amputation stump, right lower extremity: Secondary | ICD-10-CM | POA: Diagnosis not present

## 2020-04-07 DIAGNOSIS — N1831 Chronic kidney disease, stage 3a: Secondary | ICD-10-CM | POA: Diagnosis not present

## 2020-04-07 DIAGNOSIS — I5033 Acute on chronic diastolic (congestive) heart failure: Secondary | ICD-10-CM | POA: Diagnosis not present

## 2020-04-07 DIAGNOSIS — I130 Hypertensive heart and chronic kidney disease with heart failure and stage 1 through stage 4 chronic kidney disease, or unspecified chronic kidney disease: Secondary | ICD-10-CM | POA: Diagnosis not present

## 2020-04-07 DIAGNOSIS — D631 Anemia in chronic kidney disease: Secondary | ICD-10-CM | POA: Diagnosis not present

## 2020-04-07 DIAGNOSIS — L03115 Cellulitis of right lower limb: Secondary | ICD-10-CM | POA: Diagnosis not present

## 2020-04-10 DIAGNOSIS — E1122 Type 2 diabetes mellitus with diabetic chronic kidney disease: Secondary | ICD-10-CM | POA: Diagnosis not present

## 2020-04-10 DIAGNOSIS — I130 Hypertensive heart and chronic kidney disease with heart failure and stage 1 through stage 4 chronic kidney disease, or unspecified chronic kidney disease: Secondary | ICD-10-CM | POA: Diagnosis not present

## 2020-04-10 DIAGNOSIS — N1831 Chronic kidney disease, stage 3a: Secondary | ICD-10-CM | POA: Diagnosis not present

## 2020-04-10 DIAGNOSIS — T8743 Infection of amputation stump, right lower extremity: Secondary | ICD-10-CM | POA: Diagnosis not present

## 2020-04-10 DIAGNOSIS — I5033 Acute on chronic diastolic (congestive) heart failure: Secondary | ICD-10-CM | POA: Diagnosis not present

## 2020-04-10 DIAGNOSIS — D631 Anemia in chronic kidney disease: Secondary | ICD-10-CM | POA: Diagnosis not present

## 2020-04-10 DIAGNOSIS — M869 Osteomyelitis, unspecified: Secondary | ICD-10-CM | POA: Diagnosis not present

## 2020-04-10 DIAGNOSIS — L03115 Cellulitis of right lower limb: Secondary | ICD-10-CM | POA: Diagnosis not present

## 2020-04-10 DIAGNOSIS — E1169 Type 2 diabetes mellitus with other specified complication: Secondary | ICD-10-CM | POA: Diagnosis not present

## 2020-04-15 ENCOUNTER — Encounter: Payer: Self-pay | Admitting: Cardiology

## 2020-04-15 ENCOUNTER — Telehealth (INDEPENDENT_AMBULATORY_CARE_PROVIDER_SITE_OTHER): Payer: Medicare HMO | Admitting: Cardiology

## 2020-04-15 ENCOUNTER — Other Ambulatory Visit: Payer: Self-pay

## 2020-04-15 VITALS — BP 117/67 | HR 70 | Ht 67.0 in | Wt 177.0 lb

## 2020-04-15 DIAGNOSIS — I480 Paroxysmal atrial fibrillation: Secondary | ICD-10-CM

## 2020-04-15 DIAGNOSIS — I2510 Atherosclerotic heart disease of native coronary artery without angina pectoris: Secondary | ICD-10-CM

## 2020-04-15 DIAGNOSIS — I5032 Chronic diastolic (congestive) heart failure: Secondary | ICD-10-CM | POA: Diagnosis not present

## 2020-04-15 MED ORDER — ISOSORBIDE MONONITRATE ER 30 MG PO TB24: 45.0000 mg | ORAL_TABLET | Freq: Two times a day (BID) | ORAL | 3 refills | Status: DC

## 2020-04-15 NOTE — Patient Instructions (Addendum)
Medication Instructions:   Increase Imdur to 45mg  twice a day.  New prescription sent to [facility].    Continue all other medications.    Labwork: none  Testing/Procedures: none  Follow-Up: 4 months   Any Other Special Instructions Will Be Listed Below (If Applicable).  If you need a refill on your cardiac medications before your next appointment, please call your pharmacy.

## 2020-04-15 NOTE — Progress Notes (Signed)
Virtual Visit via Telephone Note   This visit type was conducted due to national recommendations for restrictions regarding the COVID-19 Pandemic (e.g. social distancing) in an effort to limit this patient's exposure and mitigate transmission in our community.  Due to his co-morbid illnesses, this patient is at least at moderate risk for complications without adequate follow up.  This format is felt to be most appropriate for this patient at this time.  The patient did not have access to video technology/had technical difficulties with video requiring transitioning to audio format only (telephone).  All issues noted in this document were discussed and addressed.  No physical exam could be performed with this format.  Please refer to the patient's chart for his  consent to telehealth for [facility].    Date:  04/15/2020   ID:  Mike Wright, DOB 06-Feb-1934, MRN [id number] The patient was identified using 2 identifiers.  Patient Location: Home Provider Location: Office/Clinic  PCP:  Monico Blitz, MD  Cardiologist:  Carlyle Dolly, MD  Electrophysiologist:  None   Evaluation Performed:  Follow-Up Visit  Chief Complaint:  Follow up visit  History of Present Illness:    Mike Wright is a 87 y.o. male seen today for focused visit on recent issues with diastolic HF.   1. CAD - nonobstructive CAD by cath Jan 2012, LVEF 60-65% by LV gram.  - 06/2015 nuclear stress without clear ischemia - 05/2015 echo LVEF 65-70%, no WMAs, cannot evaluate diastolic function 0/4888 lexiscan without ischemia, low risk study.  -cath 04/2017 as reported below. Received DES to 75% D1, DES to 75% mid to distal LAD, DES to 70% prox LAD RHC with CI 2.67, mean PA 25, no wedge reported by LVEDP 14.  - discharged on triple therapy with ASA, plavix, eliquis with plan for 30 days, then stop ASA.   - 11/22/17 admission with chest pain, anemia. Negative workup for ACS. Symptomsresolved with blood  transfusion - 11/2017 admit with chest pain and dyspnea. ACS work negative, CT PE negative. Diuresed 6L with resolustion of symptoms. CXR with rib fracture thought playing a role in chest pain.    - some chronic chest pain across entire chest. Better with SL NG. Can occur at any time. Overall unchanged   01/2020 nuclear stress: Small, mild intensity, mid to basal inferior/inferoseptal defect that is fixed and consistent with soft tissue attenuation in light of normal wall motion. No significant ischemic territories. -mild chest pains at times, better with NG     2. Chronic diastolic HF - recent admission 11/2018 with acute on chronic diastolic HF - no recent SOB/DOE. No recent edema. Takes lasix $RemoveBef'40mg'[id number]$  bid - home weights down to 184 lbs. Hospital discharge weight 191 lbs  - no recent SOB/DOE, no LE edema   3. Afib - admission 11/2018 with issues with afib - from notes had afib with slow rates, toprol was stopped  Denies any palpitations  4.History ofGI bleeding - GI bleed during 11/2018, stopped eliquis at that time, later restarted - heme +positive stools, negative endoscopy with plans for outpatient pill endoscopy  - no recent signs of blood in stools.    5. PAD - followed by vascular - admission 08/2017 with ischemia great toes. Had intervention on lower extremity vessels at that time.  -ultimately required ampuation bilateral great toes  - admit 11/2017 for poor healing right great toe amputation, critical limb ischemia -admit 02/2019 with gangrene right second toe with nonhealing diabetic foot wound. S/p amputations -  additional toe amputation 05/2019  6. PSVT/Palpitations. - no recent palpitations  7. Chronic anemia - has received multiple transfusions  The patient does not have symptoms concerning for COVID-19 infection (fever, chills, cough, or new shortness of breath).    Past Medical History:  Diagnosis Date  . Anemia    a. mild, noted  04/2017.  [name] CAD in native artery    a. [name] 04/2017 s/p DES to D1, DES to prox-mid LAD, DES to prox LAD overlapping the prior stent, LVEF 55-65%.   . Chronic diastolic CHF (congestive heart failure) ([place])   . Constipation   . COPD (chronic obstructive pulmonary disease) ([place])   . Diabetic ulcer of toe ([place])   . DJD (degenerative joint disease) of cervical spine   . Dysrhythmia    AFib  . Essential hypertension   . GERD (gastroesophageal reflux disease)   . History of hiatal hernia   . HIT (heparin-induced thrombocytopenia) ([place])   . Hypothyroidism   . Hypoxia    a. went home on home O2 04/2017.  [name] Insomnia   . Mixed hyperlipidemia   . PAD (peripheral artery disease) ([place])   . PAF (paroxysmal atrial fibrillation) ([place])   . PVD (peripheral vascular disease) ([place])   . Renal insufficiency   . 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  . Retinitis   . Sinus bradycardia    a. HR 30s-40s in 04/2017 -> diltiazem stopped, metoprolol reduced.  . Sleep apnea    "chose not to order CPAP at this time" (05/18/2017)  . Type 2 diabetes mellitus ([place])   . Wears glasses   . Wheezing    a. suspected COPD 04/2017. Former tobacco x 40 years.   Past Surgical History:  Procedure Laterality Date  . ABDOMINAL AORTOGRAM W/LOWER EXTREMITY N/A 09/15/2017   Procedure: ABDOMINAL AORTOGRAM W/LOWER EXTREMITY;  Surgeon: Serafina Mitchell, MD;  Location: [place] [facility];  Service: Cardiovascular;  Laterality: N/A;  . ABDOMINAL AORTOGRAM W/LOWER EXTREMITY N/A 02/04/2020   Procedure: ABDOMINAL AORTOGRAM W/LOWER EXTREMITY;  Surgeon: Serafina Mitchell, MD;  Location: [place] [facility];  Service: Cardiovascular;  Laterality: N/A;  . AMPUTATION Bilateral 09/20/2017   Procedure: BILATERAL GREAT TOE AMPUTATIONS INCLUDING METATARSAL HEADS;  Surgeon: Serafina Mitchell, MD;  [facility];  Service: Vascular;  Laterality: Bilateral;  . AMPUTATION Right 03/01/2019   Procedure: AMPUTATION TOES;  Surgeon: Angelia Mould, MD;  Location: [place];  Service: Vascular;  Laterality: Right;  . AMPUTATION Right 05/29/2019   Procedure: AMPUTATION RIGHT THIRD TOE;  Surgeon: Serafina Mitchell, MD;  Location: [facility] OR;  Service: Vascular;  Laterality: Right;  . AMPUTATION Right 09/26/2019   Procedure: AMPUTATION RIGHT FOURTH TOE AND FIFTH TOES;  Surgeon: Serafina Mitchell, MD;  [facility];  Service: Vascular;  Laterality: Right;  . ANGIOPLASTY Right 09/20/2017   Procedure: ANGIOPLASTY RIGHT TIBIAL ARTERY;  Surgeon: Serafina Mitchell, MD;  Location: [place];  Service: Vascular;  Laterality: Right;  . APPENDECTOMY    . BACK SURGERY    . CARDIAC CATHETERIZATION  1980s; 2012;  . CATARACT EXTRACTION Left 2004  . COLONOSCOPY  2004   FLEISHMAN TICS  . COLONOSCOPY WITH PROPOFOL N/A 11/28/2018   Procedure: COLONOSCOPY WITH PROPOFOL;  Surgeon: Rogene Houston, MD;  [facility];  Service: Endoscopy;  Laterality: N/A;  . CORONARY ANGIOPLASTY WITH STENT PLACEMENT  05/18/2017   "3 stents"  . CORONARY STENT INTERVENTION N/A 05/18/2017  Procedure: CORONARY STENT INTERVENTION;  Surgeon: Corky Crafts, MD;  Location: [facility] INVASIVE CV LAB;  Service: Cardiovascular;  Laterality: N/A;  . ESOPHAGOGASTRODUODENOSCOPY (EGD) WITH PROPOFOL N/A 11/20/2017   Procedure: ESOPHAGOGASTRODUODENOSCOPY (EGD) WITH PROPOFOL;  Surgeon: Hilarie Fredrickson, MD;  Location: [facility]OSCOPY;  Service: Gastroenterology;  Laterality: N/A;  . ESOPHAGOGASTRODUODENOSCOPY (EGD) WITH PROPOFOL N/A 11/28/2018   Procedure: ESOPHAGOGASTRODUODENOSCOPY (EGD) WITH PROPOFOL;  Surgeon: Malissa Hippo, MD;  [facility];  Service: Endoscopy;  Laterality: N/A;  . ESOPHAGOGASTRODUODENOSCOPY (EGD) WITH PROPOFOL N/A 07/12/2019   Procedure: ESOPHAGOGASTRODUODENOSCOPY (EGD) WITH PROPOFOL;  Surgeon: Malissa Hippo, MD;  [facility];  Service: Endoscopy;  Laterality: N/A;  125  . GIVENS CAPSULE STUDY N/A 12/17/2018   Procedure: GIVENS CAPSULE STUDY;  Surgeon:  Malissa Hippo, MD;  [facility];  Service: Endoscopy;  Laterality: N/A;  7:30am  . I & D EXTREMITY Right 12/17/2017   Procedure: IRRIGATION AND DEBRIDEMENT RIGHT GREAT TOE;  Surgeon: Nada Libman, MD;  [facility];  Service: Vascular;  Laterality: Right;  . JOINT REPLACEMENT    . LEFT HEART CATH AND CORONARY ANGIOGRAPHY N/A 05/18/2017   Procedure: LEFT HEART CATH AND CORONARY ANGIOGRAPHY;  Surgeon: Corky Crafts, MD;  Location: [facility] INVASIVE CV LAB;  Service: Cardiovascular;  Laterality: N/A;  . LOWER EXTREMITY ANGIOGRAM Right 09/20/2017   Procedure: RIGHT LOWER LEG  ANGIOGRAM;  Surgeon: Nada Libman, MD;  [facility];  Service: Vascular;  Laterality: Right;  . LUMBAR FUSION  2002   L3, 4 L4, 5 L5 S1 Fused by Dr. Trey Sailors  . PERIPHERAL VASCULAR BALLOON ANGIOPLASTY Left 09/15/2017   Procedure: PERIPHERAL VASCULAR BALLOON ANGIOPLASTY;  Surgeon: Nada Libman, MD;  [facility];  Service: Cardiovascular;  Laterality: Left;  PTA of Peroneal & Posterior Tibial  . POLYPECTOMY  11/28/2018   Procedure: POLYPECTOMY;  Surgeon: Malissa Hippo, MD;  [facility];  Service: Endoscopy;;  duodenum  . POSTERIOR LUMBAR FUSION    . RHINOPLASTY    . RIGHT HEART CATH N/A 05/18/2017   Procedure: RIGHT HEART CATH;  Surgeon: Corky Crafts, MD;  Location: [facility] INVASIVE CV LAB;  Service: Cardiovascular;  Laterality: N/A;  . TEE WITHOUT CARDIOVERSION N/A 09/18/2017   Procedure: TRANSESOPHAGEAL ECHOCARDIOGRAM (TEE);  Surgeon: Pricilla Riffle, MD;  Location: [facility] ENDOSCOPY;  Service: Cardiovascular;  Laterality: N/A;  . TOTAL KNEE ARTHROPLASTY Bilateral   . TRANSMETATARSAL AMPUTATION Right 02/04/2020   Procedure: RIGHT TRANSMETATARSAL AMPUTATION;  Surgeon: Nada Libman, MD;  Location: [facility] OR;  Service: Vascular;  Laterality: Right;  . TRANSURETHRAL RESECTION OF PROSTATE  2001   [place]     Current Meds  Medication Sig  . Accu-Chek Softclix Lancets  lancets   . acetaminophen (TYLENOL) 500 MG tablet Take 1 tablet (500 mg total) by mouth every 6 (six) hours as needed for mild pain or headache.  . Alcohol Swabs (B-D SINGLE USE SWABS REGULAR) PADS   . atorvastatin (LIPITOR) 80 MG tablet Take 80 mg by mouth every evening.   . Blood Glucose Monitoring Suppl (ACCU-CHEK GUIDE) w/Device KIT   . Cholecalciferol (VITAMIN D3) 50 MCG (2000 UT) TABS Take 2,000 Units by mouth daily.  . clopidogrel (PLAVIX) 75 MG tablet Take 75 mg by mouth daily.  . Cyanocobalamin (B-12) 2500 MCG TABS Take 2,500 mcg by mouth daily.  . DROPLET INSULIN SYRINGE 31G X 5/16" 1 ML MISC   . ELIQUIS 5 MG  TABS tablet TAKE 1 TABLET TWICE DAILY  . folic acid (FOLVITE) 1 MG tablet Take 1 tablet (1 mg total) by mouth daily.  . furosemide (LASIX) 80 MG tablet Take 80 mg by mouth See admin instructions. Take 80 mg by mouth in the morning and between 5-6 PM daily  . insulin glargine (LANTUS) 100 UNIT/ML injection Inject 30-40 Units into the skin See admin instructions. Inject 30 units into the skin in the morning and 40 units at bedtime  . ipratropium-albuterol (DUONEB) 0.5-2.5 (3) MG/3ML SOLN Take 3 mLs by nebulization every evening.   . isosorbide mononitrate (IMDUR) 30 MG 24 hr tablet Take 1 tablet (30 mg total) by mouth in the morning and at bedtime.  [name] lisinopril (ZESTRIL) 5 MG tablet Take 0.5 tablets (2.5 mg total) by mouth daily.  . meclizine (ANTIVERT) 25 MG tablet Take 25 mg by mouth daily as needed for dizziness.  . metFORMIN (GLUCOPHAGE) 1000 MG tablet Take 1,000 mg by mouth 2 (two) times daily.  . nitroGLYCERIN (NITROSTAT) 0.4 MG SL tablet PLACE 1 TABLET (0.4 MG TOTAL) UNDER THE TONGUE EVERY 5  MINUTES AS NEEDED FOR CHEST PAIN. (Patient taking differently: Place 0.4 mg under the tongue every 5 (five) minutes as needed for chest pain.)  . OXYGEN Inhale 2 L/min into the lungs See admin instructions. 2 L/min of oxygen at bedtime and during the day as needed for shortness of breath   . pantoprazole (PROTONIX) 40 MG tablet Take 1 tablet (40 mg total) by mouth 2 (two) times daily. To protect stomach while taking multiple blood thinners.  . potassium chloride SA (K-DUR) 20 MEQ tablet Take 1 tablet (20 mEq total) by mouth daily.  . psyllium (METAMUCIL) 58.6 % packet Take 0.5 packets by mouth 2 (two) times daily. [place]  . SPIRIVA HANDIHALER 18 MCG inhalation capsule Place 1 capsule into inhaler and inhale daily after breakfast.   . traMADol (ULTRAM) 50 MG tablet Take 1 tablet (50 mg total) by mouth every 8 (eight) hours as needed.  . traZODone (DESYREL) 100 MG tablet Take 100-200 mg by mouth at bedtime.   . TRUE METRIX BLOOD GLUCOSE TEST test strip      Allergies:   Bactrim [sulfamethoxazole-trimethoprim], Codeine, Doxycycline, Feraheme [ferumoxytol], Heparin, Losartan, Oxycodone, and Latex   Social History   Tobacco Use  . Smoking status: Former Smoker    Packs/day: 2.00    Years: 30.00    Pack years: 60.00    Types: Cigarettes    Start date: 03/28/1953    Quit date: 03/29/1983    Years since quitting: 37.0  . Smokeless tobacco: Never Used  Vaping Use  . Vaping Use: Never used  Substance Use Topics  . Alcohol use: Not Currently    Comment: 05/18/2017 'I drink a beer q yr"  . Drug use: No     Family Hx: The patient's family history includes COPD in his father and mother; CVA in his sister; Diabetes in his mother and sister; Heart attack (age of onset: 73) in his father; Heart disease in his mother; Hypertension in his sister; Multiple sclerosis in his sister.  ROS:   Please see the history of present illness.     All other systems reviewed and are negative.   Prior CV studies:   The following studies were reviewed today:  05/2015 echo Study Conclusions  - Left ventricle: The cavity size was normal. Wall thickness was increased increased in a pattern of mild to moderate LVH. Systolic  function was vigorous. The estimated ejection fraction was  in the range of 65% to 70%. Wall motion was normal; there were no regional wall motion abnormalities. The study is not technically sufficient to allow evaluation of LV diastolic function. - Aortic valve: Moderately calcified annulus. Mildly thickened leaflets. There was mild stenosis. There was mild regurgitation. Mean gradient (S): 13 mm Hg. Valve area (VTI): 1.51 cm^2. - Mitral valve: Mildly calcified annulus. Normal thickness leaflets . - Left atrium: The atrium was moderately dilated. - Technically adequate study.   06/2015 Nuclear stress test  No diagnostic ST segment changes to indicate ischemia.  Small, mild intensity, perfusion defects noted in the apical anterior and basal inferolateral walls. This is most consistent with soft tissue attenuation given normal wall motion in these regions. No large ischemic zones noted.  This is a low risk study.  Nuclear stress EF: 64%.  05/2016 Event monitor  Rhythm is atrial fibrillation throughout study. Occasioanal PVCs  Min HR 51, Max HR 107, Avg HR 67  Reported symptoms correspond with rate controlled atrial fibrillation.  09/2016 nuclear stress  There was no ST segment deviation noted during stress.  The study is normal. There are no perfusion defects consistent with prior infarct or current ischemia.  This is a low risk study.  The left ventricular ejection fraction is normal (55-65%).   04/2017 cath  Dist RCA lesion is 40% stenosed.  Prox RCA lesion is 25% stenosed.  1st Diag lesion is 75% stenosed.  A drug-eluting stent was successfully placed using a STENT SYNERGY DES 2.25X12.  Post intervention, there is a 0% residual stenosis.  Prox LAD to Mid LAD lesion is 75% stenosed.  A drug-eluting stent was successfully placed using a STENT SYNERGY DES 3.5X38.  Post intervention, there is a 0% residual stenosis.  Prox LAD lesion is 70% stenosed.  A drug-eluting stent was successfully placed using a  STENT SYNERGY DES 4X12, overlapping the prior stent.  Post intervention, there is a 0% residual stenosis.  The left ventricular systolic function is normal.  The left ventricular ejection fraction is 55-65% by visual estimate.  LV end diastolic pressure is normal.  There is no aortic valve stenosis.  Ao sat 98%, PA sat 65%; PA mean 25 mm Hg; unable to wedge the catheter.  Normal right heat pressures.  Tortuous right subclavian making catheter navigation difficult from the right radial approach.  Continue dual antiplatelet therapy along with Eliquis for 30 days.  Restart Eliquis tomorrow.  After 30 days, stop aspirin. Continue Plavix and Eliquis.   After 6 months, can consider stopping clopidogrel if he has bleeding issues. Given the nomber of stents, would try to complete one year of clopidogrel if no bleeding issues.  01/2020 nuclear stress  Small, mild intensity, mid to basal inferior/inferoseptal defect that is fixed and consistent with soft tissue attenuation in light of normal wall motion. No significant ischemic territories.  This is a low risk study.  Nuclear stress EF: 63%.   Labs/Other Tests and Data Reviewed:    EKG:  No ECG reviewed.  Recent Labs: 11/08/2019: Magnesium 1.7 02/07/2020: ALT 27; BUN 24; Creatinine, Ser 1.25; Hemoglobin 8.9; Platelets 296; Potassium 3.6; Sodium 140   Recent Lipid Panel Lab Results  Component Value Date/Time   CHOL 68 03/01/2019 03:20 AM   TRIG 114 03/01/2019 03:20 AM   HDL 16 (L) 03/01/2019 03:20 AM   CHOLHDL 4.3 03/01/2019 03:20 AM   LDLCALC 29 03/01/2019 03:20 AM    Wt  Readings from Last 3 Encounters:  04/15/20 177 lb (80.3 kg)  04/06/20 177 lb (80.3 kg)  04/02/20 184 lb 1.6 oz (83.5 kg)     Risk Assessment/Calculations:      Objective:    Vital Signs:  BP 117/67   Pulse 70   Ht 5' 7" (1.702 m)   Wt 177 lb (80.3 kg)   BMI 27.72 kg/m    Normal affect. Normal speech pattern and tone. Comfortable, no  apparent distress. No audible signs of sob or wheezing  ASSESSMENT & PLAN:    1.CAD -chronic chest pains unchanged - recent nuclear stress test without significant ischemia - does respond to NG, perhaps some microvascular disease or spasm. Will increase his imdur to 48m bid.   2. Chronic diastolic HF - no issues, we will recheck his labs on diuretic  3. Afib - off av nodal agents due to bradycardia. - no symptoms, continue eliquis.          COVID-19 Education: The signs and symptoms of COVID-19 were discussed with the patient and how to seek care for testing (follow up with PCP or arrange E-visit).  The importance of social distancing was discussed today.  Time:   Today, I have spent 17 minutes with the patient with telehealth technology discussing the above problems.     Medication Adjustments/Labs and Tests Ordered: Current medicines are reviewed at length with the patient today.  Concerns regarding medicines are outlined above.   Tests Ordered: No orders of the defined types were placed in this encounter.   Medication Changes: No orders of the defined types were placed in this encounter.   Follow Up:  In Person in 4 month(s)  Signed, BCarlyle Dolly MD  04/15/2020 8:30 AM    [place]

## 2020-04-15 NOTE — Patient Instructions (Signed)
Goals Addressed              This Visit's Progress   .  THN-Make and Keep All Appointments   On track     Timeframe:  Long-Range Goal Priority:  High Start Date:  03/24/20                           Expected End Date:   05/25/20                 Follow Up Date 05/25/20   - ask family or friend for a ride - keep a calendar with appointment dates    Why is this important?   Part of staying healthy is seeing the doctor for follow-up care.  If you forget your appointments, there are some things you can do to stay on track.    Notes: Patient sees physicians as scheduled.    .  THN-Monitor foot wound for infection (pt-stated)   On track     Timeframe:  Short-Term Goal Priority:  High Start Date:   03/24/20                          Expected End Date:   05/25/20                  Follow Up Date 04/27/20   - clean and dry skin well  -Place dressing as ordered   Why is this important?    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    Taking really good care of your skin will help to keep your skin unbroken.    Notes: Patient denies signs of infection.  Wet to dry dressings daily. Wound closing up slowly.  Home health nurse 2/week. 04/15/20-patient with recent debridement of right foot surgical site.  Area now red with some bleeding to site per patient.  Santyl dressing daily.     .  THN-Track and Manage Symptoms   On track     Timeframe:  Long-Range Goal Priority:  High Start Date:  03/24/20                           Expected End Date:  04/27/20                    Follow Up Date 04/27/20   - eat more whole grains, fruits and vegetables, lean meats and healthy fats - follow rescue plan if symptoms flare-up - know when to call the doctor - track symptoms and what helps feel better or worse    Why is this important?   You will be able to handle your symptoms better if you keep track of them.  Making some simple changes to your lifestyle will help.  Eating healthy is one  thing you can do to take good care of yourself.    Notes: 04/15/20 Weight 177 lbs.

## 2020-04-15 NOTE — Addendum Note (Signed)
Addended by: [name] on: 04/15/2020 08:59 AM   Modules accepted: Orders

## 2020-04-15 NOTE — Patient Outreach (Signed)
[facility]) Care Management  [place]  04/15/2020   Mike Wright 10-Jan-1934 [id number]  Subjective: Telephone call to patient for follow up. He reports doing good. Right foot surgical site healing but slow. Recent debridement to wound. Home health visiting twice a week.  Discussed signs of infection and importance of seeking medical treatment.  He verbalized understanding.  Patient 177 lbs. Discussed heart failure management. He verbalized understanding and voices no concerns.  Objective:   Encounter Medications:  Outpatient Encounter Medications as of 04/15/2020  Medication Sig Note  . Accu-Chek Softclix Lancets lancets    . acetaminophen (TYLENOL) 500 MG tablet Take 1 tablet (500 mg total) by mouth every 6 (six) hours as needed for mild pain or headache.   . Alcohol Swabs (B-D SINGLE USE SWABS REGULAR) PADS    . atorvastatin (LIPITOR) 80 MG tablet Take 80 mg by mouth every evening.    . Blood Glucose Monitoring Suppl (ACCU-CHEK GUIDE) w/Device KIT    . Cholecalciferol (VITAMIN D3) 50 MCG (2000 UT) TABS Take 2,000 Units by mouth daily.   . clopidogrel (PLAVIX) 75 MG tablet Take 75 mg by mouth daily.   . Cyanocobalamin (B-12) 2500 MCG TABS Take 2,500 mcg by mouth daily.   . DROPLET INSULIN SYRINGE 31G X 5/16" 1 ML MISC    . ELIQUIS 5 MG TABS tablet TAKE 1 TABLET TWICE DAILY   . folic acid (FOLVITE) 1 MG tablet Take 1 tablet (1 mg total) by mouth daily.   . furosemide (LASIX) 80 MG tablet Take 80 mg by mouth See admin instructions. Take 80 mg by mouth in the morning and between 5-6 PM daily   . insulin glargine (LANTUS) 100 UNIT/ML injection Inject 30-40 Units into the skin See admin instructions. Inject 30 units into the skin in the morning and 40 units at bedtime 02/02/2020: Regimen confirmed to be accurate by the patient  . ipratropium-albuterol (DUONEB) 0.5-2.5 (3) MG/3ML SOLN Take 3 mLs by nebulization every evening.    . isosorbide mononitrate (IMDUR) 30  MG 24 hr tablet Take 1.5 tablets (45 mg total) by mouth in the morning and at bedtime.   [name] lisinopril (ZESTRIL) 5 MG tablet Take 0.5 tablets (2.5 mg total) by mouth daily.   . meclizine (ANTIVERT) 25 MG tablet Take 25 mg by mouth daily as needed for dizziness.   . metFORMIN (GLUCOPHAGE) 1000 MG tablet Take 1,000 mg by mouth 2 (two) times daily.   . nitroGLYCERIN (NITROSTAT) 0.4 MG SL tablet PLACE 1 TABLET (0.4 MG TOTAL) UNDER THE TONGUE EVERY 5  MINUTES AS NEEDED FOR CHEST PAIN. (Patient taking differently: Place 0.4 mg under the tongue every 5 (five) minutes as needed for chest pain.)   . OXYGEN Inhale 2 L/min into the lungs See admin instructions. 2 L/min of oxygen at bedtime and during the day as needed for shortness of breath   . pantoprazole (PROTONIX) 40 MG tablet Take 1 tablet (40 mg total) by mouth 2 (two) times daily. To protect stomach while taking multiple blood thinners.   . potassium chloride SA (K-DUR) 20 MEQ tablet Take 1 tablet (20 mEq total) by mouth daily.   . psyllium (METAMUCIL) 58.6 % packet Take 0.5 packets by mouth 2 (two) times daily. [place]   . SPIRIVA HANDIHALER 18 MCG inhalation capsule Place 1 capsule into inhaler and inhale daily after breakfast.    . traMADol (ULTRAM) 50 MG tablet Take 1 tablet (50 mg total) by mouth  every 8 (eight) hours as needed.   . traZODone (DESYREL) 100 MG tablet Take 100-200 mg by mouth at bedtime.    [name] METRIX BLOOD GLUCOSE TEST test strip     No facility-administered encounter medications on file as of 04/15/2020.    Functional Status:  In your present state of health, do you have any difficulty performing the following activities: 02/10/2020 02/03/2020  Hearing? N N  Vision? N N  Comment - -  Difficulty concentrating or making decisions? N N  Walking or climbing stairs? Y Y  Comment weakness and amputation -  Dressing or bathing? N N  Doing errands, shopping? N N  Preparing Food and eating ? N -  Using the Toilet? N -  In  the past six months, have you accidently leaked urine? Y -  Comment wears depends -  Do you have problems with loss of bowel control? N -  Managing your Medications? N -  Managing your Finances? N -  Housekeeping or managing your Housekeeping? Y -  Comment wife assists -  Some recent data might be hidden    Fall/Depression Screening: Fall Risk  03/10/2020 02/10/2020 08/14/2019  Falls in the past year? 1 1 1   Comment - - week ago in the yard no injuries  Number falls in past yr: 1 0 0  Injury with Fall? 0 0 0  Risk for fall due to : Orthopedic patient - -  Follow up Falls prevention discussed - Falls prevention discussed   PHQ 2/9 Scores 02/10/2020 11/07/2019 07/29/2019 06/12/2019 12/20/2018  PHQ - 2 Score 0 0 0 0 0    Assessment:  Patient with slow healing surgical wound to right foot.  Patient managing heart failure and other chronic conditions with assistance of his wife.   Goals Addressed              This Visit's Progress   .  THN-Make and Keep All Appointments   On track     Timeframe:  Long-Range Goal Priority:  High Start Date:  03/24/20                           Expected End Date:   05/25/20                 Follow Up Date 05/25/20   - ask family or friend for a ride - keep a calendar with appointment dates    Why is this important?   Part of staying healthy is seeing the doctor for follow-up care.  If you forget your appointments, there are some things you can do to stay on track.    Notes: Patient sees physicians as scheduled.    .  THN-Monitor foot wound for infection (pt-stated)   On track     Timeframe:  Short-Term Goal Priority:  High Start Date:   03/24/20                          Expected End Date:   05/25/20                  Follow Up Date 04/27/20   - clean and dry skin well  -Place dressing as ordered   Why is this important?    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    Taking really good care of your skin will help to keep your  skin unbroken.    Notes: Patient denies signs of infection.  Wet to dry dressings daily. Wound closing up slowly.  Home health nurse 2/week. 04/15/20-patient with recent debridement of right foot surgical site.  Area now red with some bleeding to site per patient.  Santyl dressing daily.     .  THN-Track and Manage Symptoms   On track     Timeframe:  Long-Range Goal Priority:  High Start Date:  03/24/20                           Expected End Date:  04/27/20                    Follow Up Date 04/27/20   - eat more whole grains, fruits and vegetables, lean meats and healthy fats - follow rescue plan if symptoms flare-up - know when to call the doctor - track symptoms and what helps feel better or worse    Why is this important?   You will be able to handle your symptoms better if you keep track of them.  Making some simple changes to your lifestyle will help.  Eating healthy is one thing you can do to take good care of yourself.    Notes: 04/15/20 Weight 177 lbs.         Plan: RN CM will contact next month. Follow-up:  Patient agrees to Care Plan and Follow-up.   [name], RN, MSN [place] Management Care Management Coordinator Direct Line [phone] Cell [phone] Toll Free: [phone]  Fax: [phone]

## 2020-04-16 DIAGNOSIS — D631 Anemia in chronic kidney disease: Secondary | ICD-10-CM | POA: Diagnosis not present

## 2020-04-16 DIAGNOSIS — N1831 Chronic kidney disease, stage 3a: Secondary | ICD-10-CM | POA: Diagnosis not present

## 2020-04-16 DIAGNOSIS — E1122 Type 2 diabetes mellitus with diabetic chronic kidney disease: Secondary | ICD-10-CM | POA: Diagnosis not present

## 2020-04-16 DIAGNOSIS — L03115 Cellulitis of right lower limb: Secondary | ICD-10-CM | POA: Diagnosis not present

## 2020-04-16 DIAGNOSIS — T8743 Infection of amputation stump, right lower extremity: Secondary | ICD-10-CM | POA: Diagnosis not present

## 2020-04-16 DIAGNOSIS — I5033 Acute on chronic diastolic (congestive) heart failure: Secondary | ICD-10-CM | POA: Diagnosis not present

## 2020-04-16 DIAGNOSIS — M869 Osteomyelitis, unspecified: Secondary | ICD-10-CM | POA: Diagnosis not present

## 2020-04-16 DIAGNOSIS — I130 Hypertensive heart and chronic kidney disease with heart failure and stage 1 through stage 4 chronic kidney disease, or unspecified chronic kidney disease: Secondary | ICD-10-CM | POA: Diagnosis not present

## 2020-04-16 DIAGNOSIS — E1169 Type 2 diabetes mellitus with other specified complication: Secondary | ICD-10-CM | POA: Diagnosis not present

## 2020-04-21 ENCOUNTER — Telehealth: Payer: Self-pay | Admitting: Cardiology

## 2020-04-21 DIAGNOSIS — L03115 Cellulitis of right lower limb: Secondary | ICD-10-CM | POA: Diagnosis not present

## 2020-04-21 DIAGNOSIS — N1831 Chronic kidney disease, stage 3a: Secondary | ICD-10-CM | POA: Diagnosis not present

## 2020-04-21 DIAGNOSIS — M869 Osteomyelitis, unspecified: Secondary | ICD-10-CM | POA: Diagnosis not present

## 2020-04-21 DIAGNOSIS — I130 Hypertensive heart and chronic kidney disease with heart failure and stage 1 through stage 4 chronic kidney disease, or unspecified chronic kidney disease: Secondary | ICD-10-CM | POA: Diagnosis not present

## 2020-04-21 DIAGNOSIS — Z79899 Other long term (current) drug therapy: Secondary | ICD-10-CM

## 2020-04-21 DIAGNOSIS — D631 Anemia in chronic kidney disease: Secondary | ICD-10-CM | POA: Diagnosis not present

## 2020-04-21 DIAGNOSIS — I5033 Acute on chronic diastolic (congestive) heart failure: Secondary | ICD-10-CM | POA: Diagnosis not present

## 2020-04-21 DIAGNOSIS — E1122 Type 2 diabetes mellitus with diabetic chronic kidney disease: Secondary | ICD-10-CM | POA: Diagnosis not present

## 2020-04-21 DIAGNOSIS — T8743 Infection of amputation stump, right lower extremity: Secondary | ICD-10-CM | POA: Diagnosis not present

## 2020-04-21 DIAGNOSIS — E1169 Type 2 diabetes mellitus with other specified complication: Secondary | ICD-10-CM | POA: Diagnosis not present

## 2020-04-21 DIAGNOSIS — I5032 Chronic diastolic (congestive) heart failure: Secondary | ICD-10-CM

## 2020-04-21 NOTE — Telephone Encounter (Signed)
Says he was told that the would need lab work at last phone visit. Will send to provider to clarify.

## 2020-04-21 NOTE — Telephone Encounter (Signed)
Patient called stating that he thought he was suppose to be having lab work soon.

## 2020-04-22 DIAGNOSIS — N1831 Chronic kidney disease, stage 3a: Secondary | ICD-10-CM | POA: Diagnosis not present

## 2020-04-22 DIAGNOSIS — M869 Osteomyelitis, unspecified: Secondary | ICD-10-CM | POA: Diagnosis not present

## 2020-04-22 DIAGNOSIS — I5033 Acute on chronic diastolic (congestive) heart failure: Secondary | ICD-10-CM | POA: Diagnosis not present

## 2020-04-22 DIAGNOSIS — L03115 Cellulitis of right lower limb: Secondary | ICD-10-CM | POA: Diagnosis not present

## 2020-04-22 NOTE — Telephone Encounter (Signed)
Check BMET/Mg please   Zandra Abts MD

## 2020-04-23 ENCOUNTER — Other Ambulatory Visit: Payer: Self-pay

## 2020-04-23 DIAGNOSIS — I509 Heart failure, unspecified: Secondary | ICD-10-CM | POA: Diagnosis not present

## 2020-04-23 NOTE — Telephone Encounter (Signed)
Patient informed and verbalized understanding of plan. Lab orders faxed to [facility] for home health nurse to draw per patient request [phone])

## 2020-04-28 DIAGNOSIS — E1122 Type 2 diabetes mellitus with diabetic chronic kidney disease: Secondary | ICD-10-CM | POA: Diagnosis not present

## 2020-04-28 DIAGNOSIS — E1169 Type 2 diabetes mellitus with other specified complication: Secondary | ICD-10-CM | POA: Diagnosis not present

## 2020-04-28 DIAGNOSIS — I130 Hypertensive heart and chronic kidney disease with heart failure and stage 1 through stage 4 chronic kidney disease, or unspecified chronic kidney disease: Secondary | ICD-10-CM | POA: Diagnosis not present

## 2020-04-28 DIAGNOSIS — N1831 Chronic kidney disease, stage 3a: Secondary | ICD-10-CM | POA: Diagnosis not present

## 2020-04-28 DIAGNOSIS — D631 Anemia in chronic kidney disease: Secondary | ICD-10-CM | POA: Diagnosis not present

## 2020-04-28 DIAGNOSIS — I5033 Acute on chronic diastolic (congestive) heart failure: Secondary | ICD-10-CM | POA: Diagnosis not present

## 2020-04-28 DIAGNOSIS — L03115 Cellulitis of right lower limb: Secondary | ICD-10-CM | POA: Diagnosis not present

## 2020-04-28 DIAGNOSIS — T8743 Infection of amputation stump, right lower extremity: Secondary | ICD-10-CM | POA: Diagnosis not present

## 2020-04-28 DIAGNOSIS — M869 Osteomyelitis, unspecified: Secondary | ICD-10-CM | POA: Diagnosis not present

## 2020-04-30 ENCOUNTER — Other Ambulatory Visit: Payer: Self-pay

## 2020-04-30 DIAGNOSIS — M869 Osteomyelitis, unspecified: Secondary | ICD-10-CM | POA: Diagnosis not present

## 2020-04-30 DIAGNOSIS — I130 Hypertensive heart and chronic kidney disease with heart failure and stage 1 through stage 4 chronic kidney disease, or unspecified chronic kidney disease: Secondary | ICD-10-CM | POA: Diagnosis not present

## 2020-04-30 DIAGNOSIS — T8743 Infection of amputation stump, right lower extremity: Secondary | ICD-10-CM | POA: Diagnosis not present

## 2020-04-30 DIAGNOSIS — I5033 Acute on chronic diastolic (congestive) heart failure: Secondary | ICD-10-CM | POA: Diagnosis not present

## 2020-04-30 DIAGNOSIS — N1831 Chronic kidney disease, stage 3a: Secondary | ICD-10-CM | POA: Diagnosis not present

## 2020-04-30 DIAGNOSIS — L03115 Cellulitis of right lower limb: Secondary | ICD-10-CM | POA: Diagnosis not present

## 2020-04-30 DIAGNOSIS — D631 Anemia in chronic kidney disease: Secondary | ICD-10-CM | POA: Diagnosis not present

## 2020-04-30 DIAGNOSIS — E1122 Type 2 diabetes mellitus with diabetic chronic kidney disease: Secondary | ICD-10-CM | POA: Diagnosis not present

## 2020-04-30 DIAGNOSIS — E1169 Type 2 diabetes mellitus with other specified complication: Secondary | ICD-10-CM | POA: Diagnosis not present

## 2020-04-30 NOTE — Patient Instructions (Signed)
Goals    .  THN-Make and Keep All Appointments      Timeframe:  Long-Range Goal Priority:  High Start Date:  03/24/20                           Expected End Date:   11/25/20          Follow Up Date 06/25/20   - arrange a ride through an agency 1 week before appointment    Why is this important?   Part of staying healthy is seeing the doctor for follow-up care.  If you forget your appointments, there are some things you can do to stay on track.    Notes: 05/02/20 Patient sees physicians as scheduled.  No transportation problems.    .  THN-Monitor foot wound for infection (pt-stated)      Timeframe:  Short-Term Goal Priority:  High Start Date:   03/24/20                          Expected End Date:   08/25/20               Follow Up Date 06/25/20   - clean and dry skin well  -Place dressing as ordered   Why is this important?    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    Taking really good care of your skin will help to keep your skin unbroken.    Notes: Patient denies signs of infection.  Wet to dry dressings daily. Wound closing up slowly.  Home health nurse 2/week. 04/15/20-patient with recent debridement of right foot surgical site.  Area now red with some bleeding to site per patient.  Santyl dressing daily. 05/02/20 Patient reports wound is doing good.  He did have a bleeding episode that was stopped with pressure to site. Home health continues 2/wk visits.      .  THN-Track and Manage Symptoms      Timeframe:  Long-Range Goal Priority:  High Start Date:  03/24/20                           Expected End Date:  11/25/20             Follow Up Date 08/25/20   - follow rescue plan if symptoms flare-up - know when to call the doctor    Why is this important?   You will be able to handle your symptoms better if you keep track of them.  Making some simple changes to your lifestyle will help.  Eating healthy is one thing you can do to take good care of yourself.     Notes: 04/15/20 Weight 177 lbs.  05-02-20 Patient weight 179 lbs.

## 2020-04-30 NOTE — Patient Outreach (Signed)
[facility]) Care Management  [place]  04/30/2020   Mike Wright October 05, 1933 [id number]  Subjective:  Spoke with patient. He reports doing well.  Foot still slow to heal. Home Health visiting 2/week.  Patient to see vascular MD next week for wound check.  Discussed signs of infection and heart failure.  No concerns.  Objective:   Encounter Medications:  Outpatient Encounter Medications as of 04/30/2020  Medication Sig Note  . Accu-Chek Softclix Lancets lancets    . acetaminophen (TYLENOL) 500 MG tablet Take 1 tablet (500 mg total) by mouth every 6 (six) hours as needed for mild pain or headache.   . Alcohol Swabs (B-D SINGLE USE SWABS REGULAR) PADS    . atorvastatin (LIPITOR) 80 MG tablet Take 80 mg by mouth every evening.    . Blood Glucose Monitoring Suppl (ACCU-CHEK GUIDE) w/Device KIT    . Cholecalciferol (VITAMIN D3) 50 MCG (2000 UT) TABS Take 2,000 Units by mouth daily.   . clopidogrel (PLAVIX) 75 MG tablet Take 75 mg by mouth daily.   . Cyanocobalamin (B-12) 2500 MCG TABS Take 2,500 mcg by mouth daily.   . DROPLET INSULIN SYRINGE 31G X 5/16" 1 ML MISC    . ELIQUIS 5 MG TABS tablet TAKE 1 TABLET TWICE DAILY   . folic acid (FOLVITE) 1 MG tablet Take 1 tablet (1 mg total) by mouth daily.   . furosemide (LASIX) 80 MG tablet Take 80 mg by mouth See admin instructions. Take 80 mg by mouth in the morning and between 5-6 PM daily   . insulin glargine (LANTUS) 100 UNIT/ML injection Inject 30-40 Units into the skin See admin instructions. Inject 30 units into the skin in the morning and 40 units at bedtime 02/02/2020: Regimen confirmed to be accurate by the patient  . ipratropium-albuterol (DUONEB) 0.5-2.5 (3) MG/3ML SOLN Take 3 mLs by nebulization every evening.    . isosorbide mononitrate (IMDUR) 30 MG 24 hr tablet Take 1.5 tablets (45 mg total) by mouth in the morning and at bedtime.   [name] lisinopril (ZESTRIL) 5 MG tablet Take 0.5 tablets (2.5 mg total) by mouth  daily.   . meclizine (ANTIVERT) 25 MG tablet Take 25 mg by mouth daily as needed for dizziness.   . metFORMIN (GLUCOPHAGE) 1000 MG tablet Take 1,000 mg by mouth 2 (two) times daily.   . nitroGLYCERIN (NITROSTAT) 0.4 MG SL tablet PLACE 1 TABLET (0.4 MG TOTAL) UNDER THE TONGUE EVERY 5  MINUTES AS NEEDED FOR CHEST PAIN. (Patient taking differently: Place 0.4 mg under the tongue every 5 (five) minutes as needed for chest pain.)   . OXYGEN Inhale 2 L/min into the lungs See admin instructions. 2 L/min of oxygen at bedtime and during the day as needed for shortness of breath   . pantoprazole (PROTONIX) 40 MG tablet Take 1 tablet (40 mg total) by mouth 2 (two) times daily. To protect stomach while taking multiple blood thinners.   . potassium chloride SA (K-DUR) 20 MEQ tablet Take 1 tablet (20 mEq total) by mouth daily.   . psyllium (METAMUCIL) 58.6 % packet Take 0.5 packets by mouth 2 (two) times daily. Mike Wright   . SPIRIVA HANDIHALER 18 MCG inhalation capsule Place 1 capsule into inhaler and inhale daily after breakfast.    . traMADol (ULTRAM) 50 MG tablet Take 1 tablet (50 mg total) by mouth every 8 (eight) hours as needed.   . traZODone (DESYREL) 100 MG tablet Take 100-200 mg by  mouth at bedtime.    [name] METRIX BLOOD GLUCOSE TEST test strip     No facility-administered encounter medications on file as of 04/30/2020.    Functional Status:  In your present state of health, do you have any difficulty performing the following activities: 02/10/2020 02/03/2020  Hearing? N N  Vision? N N  Comment - -  Difficulty concentrating or making decisions? N N  Walking or climbing stairs? Y Y  Comment weakness and amputation -  Dressing or bathing? N N  Doing errands, shopping? N N  Preparing Food and eating ? N -  Using the Toilet? N -  In the past six months, have you accidently leaked urine? Y -  Comment wears depends -  Do you have problems with loss of bowel control? N -  Managing your  Medications? N -  Managing your Finances? N -  Housekeeping or managing your Housekeeping? Y -  Comment wife assists -  Some recent data might be hidden    Fall/Depression Screening: Fall Risk  03/10/2020 02/10/2020 08/14/2019  Falls in the past year? 1 1 1   Comment - - week ago in the yard no injuries  Number falls in past yr: 1 0 0  Injury with Fall? 0 0 0  Risk for fall due to : Orthopedic patient - -  Follow up Falls prevention discussed - Falls prevention discussed   PHQ 2/9 Scores 02/10/2020 11/07/2019 07/29/2019 06/12/2019 12/20/2018  PHQ - 2 Score 0 0 0 0 0    Assessment:  Goals Addressed              This Visit's Progress   .  THN-Make and Keep All Appointments   On track     Timeframe:  Long-Range Goal Priority:  High Start Date:  03/24/20                           Expected End Date:   11/25/20          Follow Up Date 06/25/20   - arrange a ride through an agency 1 week before appointment    Why is this important?   Part of staying healthy is seeing the doctor for follow-up care.  If you forget your appointments, there are some things you can do to stay on track.    Notes: 05/02/20 Patient sees physicians as scheduled.  No transportation problems.    .  THN-Monitor foot wound for infection (pt-stated)   On track     Timeframe:  Short-Term Goal Priority:  High Start Date:   03/24/20                          Expected End Date:   08/25/20               Follow Up Date 06/25/20   - clean and dry skin well  -Place dressing as ordered   Why is this important?    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    Taking really good care of your skin will help to keep your skin unbroken.    Notes: Patient denies signs of infection.  Wet to dry dressings daily. Wound closing up slowly.  Home health nurse 2/week. 04/15/20-patient with recent debridement of right foot surgical site.  Area now red with some bleeding to site per patient.  Santyl dressing daily.  05/02/20  Patient reports wound is doing good.  He did have a bleeding episode that was stopped with pressure to site. Home health continues 2/wk visits.      [name]  THN-Track and Manage Symptoms   On track     Timeframe:  Long-Range Goal Priority:  High Start Date:  03/24/20                           Expected End Date:  11/25/20             Follow Up Date 08/25/20   - follow rescue plan if symptoms flare-up - know when to call the doctor    Why is this important?   You will be able to handle your symptoms better if you keep track of them.  Making some simple changes to your lifestyle will help.  Eating healthy is one thing you can do to take good care of yourself.    Notes: 04/15/20 Weight 177 lbs.  05-02-20 Patient weight 179 lbs.         Plan: RN CM will follow up next month.   Follow-up:  Patient agrees to Care Plan and Follow-up.   [name], RN, MSN [place] Management Care Management Coordinator Direct Line [phone] Cell [phone] Toll Free: [phone]  Fax: [phone]

## 2020-05-04 ENCOUNTER — Telehealth: Payer: Self-pay | Admitting: *Deleted

## 2020-05-04 ENCOUNTER — Ambulatory Visit: Payer: Medicare HMO | Admitting: Surgery

## 2020-05-04 NOTE — Telephone Encounter (Signed)
-----   Message from Arnoldo Lenis, MD sent at 05/04/2020 10:36 AM EST ----- Overall labs look fine  Zandra Abts MD

## 2020-05-04 NOTE — Telephone Encounter (Signed)
Pt aware.

## 2020-05-05 DIAGNOSIS — L03115 Cellulitis of right lower limb: Secondary | ICD-10-CM | POA: Diagnosis not present

## 2020-05-05 DIAGNOSIS — N1831 Chronic kidney disease, stage 3a: Secondary | ICD-10-CM | POA: Diagnosis not present

## 2020-05-05 DIAGNOSIS — E1122 Type 2 diabetes mellitus with diabetic chronic kidney disease: Secondary | ICD-10-CM | POA: Diagnosis not present

## 2020-05-05 DIAGNOSIS — M869 Osteomyelitis, unspecified: Secondary | ICD-10-CM | POA: Diagnosis not present

## 2020-05-05 DIAGNOSIS — I5033 Acute on chronic diastolic (congestive) heart failure: Secondary | ICD-10-CM | POA: Diagnosis not present

## 2020-05-05 DIAGNOSIS — T8743 Infection of amputation stump, right lower extremity: Secondary | ICD-10-CM | POA: Diagnosis not present

## 2020-05-05 DIAGNOSIS — D631 Anemia in chronic kidney disease: Secondary | ICD-10-CM | POA: Diagnosis not present

## 2020-05-05 DIAGNOSIS — E1169 Type 2 diabetes mellitus with other specified complication: Secondary | ICD-10-CM | POA: Diagnosis not present

## 2020-05-05 DIAGNOSIS — I130 Hypertensive heart and chronic kidney disease with heart failure and stage 1 through stage 4 chronic kidney disease, or unspecified chronic kidney disease: Secondary | ICD-10-CM | POA: Diagnosis not present

## 2020-05-07 DIAGNOSIS — Z89431 Acquired absence of right foot: Secondary | ICD-10-CM | POA: Diagnosis not present

## 2020-05-07 DIAGNOSIS — I10 Essential (primary) hypertension: Secondary | ICD-10-CM | POA: Diagnosis not present

## 2020-05-07 DIAGNOSIS — D692 Other nonthrombocytopenic purpura: Secondary | ICD-10-CM | POA: Diagnosis not present

## 2020-05-07 DIAGNOSIS — Z7901 Long term (current) use of anticoagulants: Secondary | ICD-10-CM | POA: Diagnosis not present

## 2020-05-07 DIAGNOSIS — E785 Hyperlipidemia, unspecified: Secondary | ICD-10-CM | POA: Diagnosis not present

## 2020-05-07 DIAGNOSIS — I4891 Unspecified atrial fibrillation: Secondary | ICD-10-CM | POA: Diagnosis not present

## 2020-05-07 DIAGNOSIS — D6869 Other thrombophilia: Secondary | ICD-10-CM | POA: Diagnosis not present

## 2020-05-07 DIAGNOSIS — Z89412 Acquired absence of left great toe: Secondary | ICD-10-CM | POA: Diagnosis not present

## 2020-05-07 DIAGNOSIS — I739 Peripheral vascular disease, unspecified: Secondary | ICD-10-CM | POA: Diagnosis not present

## 2020-05-08 DIAGNOSIS — M869 Osteomyelitis, unspecified: Secondary | ICD-10-CM | POA: Diagnosis not present

## 2020-05-08 DIAGNOSIS — L03115 Cellulitis of right lower limb: Secondary | ICD-10-CM | POA: Diagnosis not present

## 2020-05-08 DIAGNOSIS — E1169 Type 2 diabetes mellitus with other specified complication: Secondary | ICD-10-CM | POA: Diagnosis not present

## 2020-05-08 DIAGNOSIS — I5033 Acute on chronic diastolic (congestive) heart failure: Secondary | ICD-10-CM | POA: Diagnosis not present

## 2020-05-08 DIAGNOSIS — N1831 Chronic kidney disease, stage 3a: Secondary | ICD-10-CM | POA: Diagnosis not present

## 2020-05-08 DIAGNOSIS — T8743 Infection of amputation stump, right lower extremity: Secondary | ICD-10-CM | POA: Diagnosis not present

## 2020-05-08 DIAGNOSIS — E1122 Type 2 diabetes mellitus with diabetic chronic kidney disease: Secondary | ICD-10-CM | POA: Diagnosis not present

## 2020-05-08 DIAGNOSIS — D631 Anemia in chronic kidney disease: Secondary | ICD-10-CM | POA: Diagnosis not present

## 2020-05-08 DIAGNOSIS — I130 Hypertensive heart and chronic kidney disease with heart failure and stage 1 through stage 4 chronic kidney disease, or unspecified chronic kidney disease: Secondary | ICD-10-CM | POA: Diagnosis not present

## 2020-05-11 ENCOUNTER — Other Ambulatory Visit: Payer: Self-pay

## 2020-05-11 ENCOUNTER — Ambulatory Visit (INDEPENDENT_AMBULATORY_CARE_PROVIDER_SITE_OTHER): Payer: Self-pay | Admitting: Physician Assistant

## 2020-05-11 VITALS — BP 107/59 | HR 83 | Temp 98.4°F | Resp 20 | Ht 67.0 in | Wt 184.7 lb

## 2020-05-11 DIAGNOSIS — I739 Peripheral vascular disease, unspecified: Secondary | ICD-10-CM

## 2020-05-11 NOTE — Progress Notes (Signed)
POST OPERATIVE OFFICE NOTE    CC:  F/u for surgery  HPI:  This is a 87 y.o. male who is s/p arteriogram on 02/04/2020, which revealed no hemodynamically significant lesions.  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 Trula Wright.       He was last seen on 04/06/2020 and at that time, his wife was performing amp site dressing changes with wet to dry and santyl to the lateral aspect of his incision.  He was also having a HHRN come out twice a week for wound care.  He was wearing his Darco shoe.   Dr. Trula Wright had sharply debrided some of the devitalized tissue at the lateral aspect of the incision as well as some callous skin along the incisional edge.  It had good bleeding from the medial granulation tissue at a previous visit.   Pt returns today for follow up.  He states he is doing well.  He did have some bleeding of the middle portion of the incision about 2 weeks ago.  He states they held pressure and it stopped.  He states it got his wife wet from squirting.  He has a little bloody ooze from the medial portion as well.  He is on Eliquis and Plavix.  He has hx of afib.  He has not had any fevers.  He continues to use his Darco shoe.  Says they are doing dry dressing only on the foot.    Allergies  Allergen Reactions  . Bactrim [Sulfamethoxazole-Trimethoprim] Other (See Comments)    Pancytopenia  . Codeine Shortness Of Breath  . Doxycycline Swelling    Swelling and numbness in lips and face. Swelling improved after stopping. Reports still experiences numbness in bottom lip.   Mike Wright [Ferumoxytol] Other (See Comments)    Diaphoretic, chest pain  . Heparin Other (See Comments)    +HIT,  Severe bleeding (with heparin drip & large doses), tolerates low doses  . Losartan Swelling  . Oxycodone Other (See Comments)    "Made me act out of my mind" Mental status changes- hallucinations  . Latex Rash    Current Outpatient Medications  Medication Sig Dispense Refill  . Accu-Chek  Softclix Lancets lancets     . acetaminophen (TYLENOL) 500 MG tablet Take 1 tablet (500 mg total) by mouth every 6 (six) hours as needed for mild pain or headache. 30 tablet 0  . Alcohol Swabs (B-D SINGLE USE SWABS REGULAR) PADS     . atorvastatin (LIPITOR) 80 MG tablet Take 80 mg by mouth every evening.     . Blood Glucose Monitoring Suppl (ACCU-CHEK GUIDE) w/Device KIT     . Cholecalciferol (VITAMIN D3) 50 MCG (2000 UT) TABS Take 2,000 Units by mouth daily.    . clopidogrel (PLAVIX) 75 MG tablet Take 75 mg by mouth daily.    . Cyanocobalamin (B-12) 2500 MCG TABS Take 2,500 mcg by mouth daily.    . DROPLET INSULIN SYRINGE 31G X 5/16" 1 ML MISC     . ELIQUIS 5 MG TABS tablet TAKE 1 TABLET TWICE DAILY 048 tablet 3  . folic acid (FOLVITE) 1 MG tablet Take 1 tablet (1 mg total) by mouth daily. 30 tablet 1  . furosemide (LASIX) 80 MG tablet Take 80 mg by mouth See admin instructions. Take 80 mg by mouth in the morning and between 5-6 PM daily    . insulin glargine (LANTUS) 100 UNIT/ML injection Inject 30-40 Units into the skin See admin  instructions. Inject 30 units into the skin in the morning and 40 units at bedtime    . ipratropium-albuterol (DUONEB) 0.5-2.5 (3) MG/3ML SOLN Take 3 mLs by nebulization every evening.     . isosorbide mononitrate (IMDUR) 30 MG 24 hr tablet Take 1.5 tablets (45 mg total) by mouth in the morning and at bedtime. 270 tablet 3  . lisinopril (ZESTRIL) 5 MG tablet Take 0.5 tablets (2.5 mg total) by mouth daily.    . meclizine (ANTIVERT) 25 MG tablet Take 25 mg by mouth daily as needed for dizziness.    . metFORMIN (GLUCOPHAGE) 1000 MG tablet Take 1,000 mg by mouth 2 (two) times daily.    . nitroGLYCERIN (NITROSTAT) 0.4 MG SL tablet PLACE 1 TABLET (0.4 MG TOTAL) UNDER THE TONGUE EVERY 5  MINUTES AS NEEDED FOR CHEST PAIN. (Patient taking differently: Place 0.4 mg under the tongue every 5 (five) minutes as needed for chest pain.) 25 tablet 3  . OXYGEN Inhale 2 L/min into the  lungs See admin instructions. 2 L/min of oxygen at bedtime and during the day as needed for shortness of breath    . pantoprazole (PROTONIX) 40 MG tablet Take 1 tablet (40 mg total) by mouth 2 (two) times daily. To protect stomach while taking multiple blood thinners. 60 tablet 2  . potassium chloride SA (K-DUR) 20 MEQ tablet Take 1 tablet (20 mEq total) by mouth daily. 30 tablet 1  . psyllium (METAMUCIL) 58.6 % packet Take 0.5 packets by mouth 2 (two) times daily. [place]    . SPIRIVA HANDIHALER 18 MCG inhalation capsule Place 1 capsule into inhaler and inhale daily after breakfast.     . traMADol (ULTRAM) 50 MG tablet Take 1 tablet (50 mg total) by mouth every 8 (eight) hours as needed. 20 tablet 0  . traZODone (DESYREL) 100 MG tablet Take 100-200 mg by mouth at bedtime.     . TRUE METRIX BLOOD GLUCOSE TEST test strip      No current facility-administered medications for this visit.     ROS:  See HPI  Physical Exam:  Today's Vitals   05/11/20 1010  BP: (!) 107/59  Pulse: 83  Resp: 20  Temp: 98.4 F (36.9 C)  TempSrc: Temporal  SpO2: 95%  Weight: 184 lb 11.2 oz (83.8 kg)  Height: 5' 7"  (1.702 m)   Body mass index is 28.93 kg/m.    Incision:        Extremities:  Brisk bilateral peroneal doppler signals; monophasic DP/PT bilaterally   Assessment/Plan:  This is a 87 y.o. 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 Trula Wright  -pt's right foot wound much improved.  Discussed with Dr. Trula Wright and pt will continue dry dressings and walking with Darco shoe.   -will have him f/u in 4-6 weeks with PA with RLE arterial duplex and ABI -he will call sooner if he has any issues.   Mike Wright, [facility] [phone]  Clinic MD:  Mike Wright

## 2020-05-12 ENCOUNTER — Other Ambulatory Visit: Payer: Self-pay

## 2020-05-12 DIAGNOSIS — I5033 Acute on chronic diastolic (congestive) heart failure: Secondary | ICD-10-CM | POA: Diagnosis not present

## 2020-05-12 DIAGNOSIS — E1122 Type 2 diabetes mellitus with diabetic chronic kidney disease: Secondary | ICD-10-CM | POA: Diagnosis not present

## 2020-05-12 DIAGNOSIS — M869 Osteomyelitis, unspecified: Secondary | ICD-10-CM | POA: Diagnosis not present

## 2020-05-12 DIAGNOSIS — I739 Peripheral vascular disease, unspecified: Secondary | ICD-10-CM

## 2020-05-12 DIAGNOSIS — L03115 Cellulitis of right lower limb: Secondary | ICD-10-CM | POA: Diagnosis not present

## 2020-05-12 DIAGNOSIS — E1169 Type 2 diabetes mellitus with other specified complication: Secondary | ICD-10-CM | POA: Diagnosis not present

## 2020-05-12 DIAGNOSIS — I130 Hypertensive heart and chronic kidney disease with heart failure and stage 1 through stage 4 chronic kidney disease, or unspecified chronic kidney disease: Secondary | ICD-10-CM | POA: Diagnosis not present

## 2020-05-12 DIAGNOSIS — N1831 Chronic kidney disease, stage 3a: Secondary | ICD-10-CM | POA: Diagnosis not present

## 2020-05-12 DIAGNOSIS — D631 Anemia in chronic kidney disease: Secondary | ICD-10-CM | POA: Diagnosis not present

## 2020-05-12 DIAGNOSIS — T8743 Infection of amputation stump, right lower extremity: Secondary | ICD-10-CM | POA: Diagnosis not present

## 2020-05-15 DIAGNOSIS — L03115 Cellulitis of right lower limb: Secondary | ICD-10-CM | POA: Diagnosis not present

## 2020-05-15 DIAGNOSIS — D631 Anemia in chronic kidney disease: Secondary | ICD-10-CM | POA: Diagnosis not present

## 2020-05-15 DIAGNOSIS — N1831 Chronic kidney disease, stage 3a: Secondary | ICD-10-CM | POA: Diagnosis not present

## 2020-05-15 DIAGNOSIS — T8743 Infection of amputation stump, right lower extremity: Secondary | ICD-10-CM | POA: Diagnosis not present

## 2020-05-15 DIAGNOSIS — I5033 Acute on chronic diastolic (congestive) heart failure: Secondary | ICD-10-CM | POA: Diagnosis not present

## 2020-05-15 DIAGNOSIS — E1122 Type 2 diabetes mellitus with diabetic chronic kidney disease: Secondary | ICD-10-CM | POA: Diagnosis not present

## 2020-05-15 DIAGNOSIS — E1169 Type 2 diabetes mellitus with other specified complication: Secondary | ICD-10-CM | POA: Diagnosis not present

## 2020-05-15 DIAGNOSIS — M869 Osteomyelitis, unspecified: Secondary | ICD-10-CM | POA: Diagnosis not present

## 2020-05-15 DIAGNOSIS — I130 Hypertensive heart and chronic kidney disease with heart failure and stage 1 through stage 4 chronic kidney disease, or unspecified chronic kidney disease: Secondary | ICD-10-CM | POA: Diagnosis not present

## 2020-05-19 DIAGNOSIS — T8743 Infection of amputation stump, right lower extremity: Secondary | ICD-10-CM | POA: Diagnosis not present

## 2020-05-19 DIAGNOSIS — E1122 Type 2 diabetes mellitus with diabetic chronic kidney disease: Secondary | ICD-10-CM | POA: Diagnosis not present

## 2020-05-19 DIAGNOSIS — M869 Osteomyelitis, unspecified: Secondary | ICD-10-CM | POA: Diagnosis not present

## 2020-05-19 DIAGNOSIS — E1169 Type 2 diabetes mellitus with other specified complication: Secondary | ICD-10-CM | POA: Diagnosis not present

## 2020-05-19 DIAGNOSIS — I130 Hypertensive heart and chronic kidney disease with heart failure and stage 1 through stage 4 chronic kidney disease, or unspecified chronic kidney disease: Secondary | ICD-10-CM | POA: Diagnosis not present

## 2020-05-19 DIAGNOSIS — D631 Anemia in chronic kidney disease: Secondary | ICD-10-CM | POA: Diagnosis not present

## 2020-05-19 DIAGNOSIS — I5033 Acute on chronic diastolic (congestive) heart failure: Secondary | ICD-10-CM | POA: Diagnosis not present

## 2020-05-19 DIAGNOSIS — N1831 Chronic kidney disease, stage 3a: Secondary | ICD-10-CM | POA: Diagnosis not present

## 2020-05-19 DIAGNOSIS — L03115 Cellulitis of right lower limb: Secondary | ICD-10-CM | POA: Diagnosis not present

## 2020-05-22 DIAGNOSIS — L03115 Cellulitis of right lower limb: Secondary | ICD-10-CM | POA: Diagnosis not present

## 2020-05-22 DIAGNOSIS — I5033 Acute on chronic diastolic (congestive) heart failure: Secondary | ICD-10-CM | POA: Diagnosis not present

## 2020-05-22 DIAGNOSIS — T8743 Infection of amputation stump, right lower extremity: Secondary | ICD-10-CM | POA: Diagnosis not present

## 2020-05-22 DIAGNOSIS — D631 Anemia in chronic kidney disease: Secondary | ICD-10-CM | POA: Diagnosis not present

## 2020-05-22 DIAGNOSIS — N1831 Chronic kidney disease, stage 3a: Secondary | ICD-10-CM | POA: Diagnosis not present

## 2020-05-22 DIAGNOSIS — M869 Osteomyelitis, unspecified: Secondary | ICD-10-CM | POA: Diagnosis not present

## 2020-05-22 DIAGNOSIS — E1169 Type 2 diabetes mellitus with other specified complication: Secondary | ICD-10-CM | POA: Diagnosis not present

## 2020-05-22 DIAGNOSIS — E1122 Type 2 diabetes mellitus with diabetic chronic kidney disease: Secondary | ICD-10-CM | POA: Diagnosis not present

## 2020-05-22 DIAGNOSIS — I130 Hypertensive heart and chronic kidney disease with heart failure and stage 1 through stage 4 chronic kidney disease, or unspecified chronic kidney disease: Secondary | ICD-10-CM | POA: Diagnosis not present

## 2020-05-24 DIAGNOSIS — I509 Heart failure, unspecified: Secondary | ICD-10-CM | POA: Diagnosis not present

## 2020-05-26 DIAGNOSIS — M869 Osteomyelitis, unspecified: Secondary | ICD-10-CM | POA: Diagnosis not present

## 2020-05-26 DIAGNOSIS — L03115 Cellulitis of right lower limb: Secondary | ICD-10-CM | POA: Diagnosis not present

## 2020-05-26 DIAGNOSIS — E1169 Type 2 diabetes mellitus with other specified complication: Secondary | ICD-10-CM | POA: Diagnosis not present

## 2020-05-26 DIAGNOSIS — D631 Anemia in chronic kidney disease: Secondary | ICD-10-CM | POA: Diagnosis not present

## 2020-05-26 DIAGNOSIS — E1122 Type 2 diabetes mellitus with diabetic chronic kidney disease: Secondary | ICD-10-CM | POA: Diagnosis not present

## 2020-05-26 DIAGNOSIS — I5033 Acute on chronic diastolic (congestive) heart failure: Secondary | ICD-10-CM | POA: Diagnosis not present

## 2020-05-26 DIAGNOSIS — I130 Hypertensive heart and chronic kidney disease with heart failure and stage 1 through stage 4 chronic kidney disease, or unspecified chronic kidney disease: Secondary | ICD-10-CM | POA: Diagnosis not present

## 2020-05-26 DIAGNOSIS — N1831 Chronic kidney disease, stage 3a: Secondary | ICD-10-CM | POA: Diagnosis not present

## 2020-05-26 DIAGNOSIS — T8743 Infection of amputation stump, right lower extremity: Secondary | ICD-10-CM | POA: Diagnosis not present

## 2020-05-28 ENCOUNTER — Other Ambulatory Visit: Payer: Self-pay

## 2020-05-28 NOTE — Patient Outreach (Signed)
[facility]) Care Management  05/28/2020  Mike Wright [birth year] [id number]   Telephone call to patient for follow up. No answer. Unable to leave a message.    Plan: RN CM will contact patient again this month.   [name], RN, MSN [place] Management Care Management Coordinator Direct Line [phone] Cell [phone] Toll Free: [phone]  Fax: [phone]

## 2020-05-29 DIAGNOSIS — I130 Hypertensive heart and chronic kidney disease with heart failure and stage 1 through stage 4 chronic kidney disease, or unspecified chronic kidney disease: Secondary | ICD-10-CM | POA: Diagnosis not present

## 2020-05-29 DIAGNOSIS — T8743 Infection of amputation stump, right lower extremity: Secondary | ICD-10-CM | POA: Diagnosis not present

## 2020-05-29 DIAGNOSIS — E1169 Type 2 diabetes mellitus with other specified complication: Secondary | ICD-10-CM | POA: Diagnosis not present

## 2020-05-29 DIAGNOSIS — N1831 Chronic kidney disease, stage 3a: Secondary | ICD-10-CM | POA: Diagnosis not present

## 2020-05-29 DIAGNOSIS — D631 Anemia in chronic kidney disease: Secondary | ICD-10-CM | POA: Diagnosis not present

## 2020-05-29 DIAGNOSIS — I5033 Acute on chronic diastolic (congestive) heart failure: Secondary | ICD-10-CM | POA: Diagnosis not present

## 2020-05-29 DIAGNOSIS — E1122 Type 2 diabetes mellitus with diabetic chronic kidney disease: Secondary | ICD-10-CM | POA: Diagnosis not present

## 2020-05-29 DIAGNOSIS — L03115 Cellulitis of right lower limb: Secondary | ICD-10-CM | POA: Diagnosis not present

## 2020-05-29 DIAGNOSIS — M869 Osteomyelitis, unspecified: Secondary | ICD-10-CM | POA: Diagnosis not present

## 2020-06-02 DIAGNOSIS — E1122 Type 2 diabetes mellitus with diabetic chronic kidney disease: Secondary | ICD-10-CM | POA: Diagnosis not present

## 2020-06-02 DIAGNOSIS — I130 Hypertensive heart and chronic kidney disease with heart failure and stage 1 through stage 4 chronic kidney disease, or unspecified chronic kidney disease: Secondary | ICD-10-CM | POA: Diagnosis not present

## 2020-06-02 DIAGNOSIS — E1169 Type 2 diabetes mellitus with other specified complication: Secondary | ICD-10-CM | POA: Diagnosis not present

## 2020-06-02 DIAGNOSIS — I5033 Acute on chronic diastolic (congestive) heart failure: Secondary | ICD-10-CM | POA: Diagnosis not present

## 2020-06-02 DIAGNOSIS — D631 Anemia in chronic kidney disease: Secondary | ICD-10-CM | POA: Diagnosis not present

## 2020-06-02 DIAGNOSIS — T8743 Infection of amputation stump, right lower extremity: Secondary | ICD-10-CM | POA: Diagnosis not present

## 2020-06-02 DIAGNOSIS — N1831 Chronic kidney disease, stage 3a: Secondary | ICD-10-CM | POA: Diagnosis not present

## 2020-06-02 DIAGNOSIS — L03115 Cellulitis of right lower limb: Secondary | ICD-10-CM | POA: Diagnosis not present

## 2020-06-02 DIAGNOSIS — M869 Osteomyelitis, unspecified: Secondary | ICD-10-CM | POA: Diagnosis not present

## 2020-06-08 DIAGNOSIS — T8743 Infection of amputation stump, right lower extremity: Secondary | ICD-10-CM | POA: Diagnosis not present

## 2020-06-08 DIAGNOSIS — E1122 Type 2 diabetes mellitus with diabetic chronic kidney disease: Secondary | ICD-10-CM | POA: Diagnosis not present

## 2020-06-08 DIAGNOSIS — I130 Hypertensive heart and chronic kidney disease with heart failure and stage 1 through stage 4 chronic kidney disease, or unspecified chronic kidney disease: Secondary | ICD-10-CM | POA: Diagnosis not present

## 2020-06-08 DIAGNOSIS — I5033 Acute on chronic diastolic (congestive) heart failure: Secondary | ICD-10-CM | POA: Diagnosis not present

## 2020-06-08 DIAGNOSIS — L03115 Cellulitis of right lower limb: Secondary | ICD-10-CM | POA: Diagnosis not present

## 2020-06-08 DIAGNOSIS — N1831 Chronic kidney disease, stage 3a: Secondary | ICD-10-CM | POA: Diagnosis not present

## 2020-06-08 DIAGNOSIS — M869 Osteomyelitis, unspecified: Secondary | ICD-10-CM | POA: Diagnosis not present

## 2020-06-08 DIAGNOSIS — E1169 Type 2 diabetes mellitus with other specified complication: Secondary | ICD-10-CM | POA: Diagnosis not present

## 2020-06-08 DIAGNOSIS — D631 Anemia in chronic kidney disease: Secondary | ICD-10-CM | POA: Diagnosis not present

## 2020-06-12 DIAGNOSIS — Z872 Personal history of diseases of the skin and subcutaneous tissue: Secondary | ICD-10-CM | POA: Diagnosis not present

## 2020-06-12 DIAGNOSIS — G4733 Obstructive sleep apnea (adult) (pediatric): Secondary | ICD-10-CM | POA: Diagnosis not present

## 2020-06-12 DIAGNOSIS — Z89421 Acquired absence of other right toe(s): Secondary | ICD-10-CM | POA: Diagnosis not present

## 2020-06-12 DIAGNOSIS — Z7901 Long term (current) use of anticoagulants: Secondary | ICD-10-CM | POA: Diagnosis not present

## 2020-06-12 DIAGNOSIS — I70209 Unspecified atherosclerosis of native arteries of extremities, unspecified extremity: Secondary | ICD-10-CM | POA: Diagnosis not present

## 2020-06-12 DIAGNOSIS — D692 Other nonthrombocytopenic purpura: Secondary | ICD-10-CM | POA: Diagnosis not present

## 2020-06-15 ENCOUNTER — Other Ambulatory Visit: Payer: Self-pay

## 2020-06-15 ENCOUNTER — Ambulatory Visit: Payer: Medicare HMO | Admitting: Physician Assistant

## 2020-06-15 ENCOUNTER — Ambulatory Visit (INDEPENDENT_AMBULATORY_CARE_PROVIDER_SITE_OTHER)
Admission: RE | Admit: 2020-06-15 | Discharge: 2020-06-15 | Disposition: A | Discharge status: Home / Self Care | Payer: Medicare HMO | Source: Ambulatory Visit | Attending: Surgery | Admitting: Surgery

## 2020-06-15 ENCOUNTER — Ambulatory Visit (HOSPITAL_COMMUNITY)
Admission: RE | Admit: 2020-06-15 | Discharge: 2020-06-15 | Disposition: A | Discharge status: Home / Self Care | Payer: Medicare HMO | Source: Ambulatory Visit | Attending: Surgery | Admitting: Surgery

## 2020-06-15 VITALS — BP 124/66 | HR 60 | Temp 97.2°F | Resp 18 | Ht 67.0 in | Wt 182.0 lb

## 2020-06-15 DIAGNOSIS — I739 Peripheral vascular disease, unspecified: Secondary | ICD-10-CM | POA: Diagnosis not present

## 2020-06-15 NOTE — Progress Notes (Signed)
Office Note     CC:  follow up Requesting Mike Wright:  Monico Blitz, MD  HPI: Mike Wright is a 87 y.o. (December 14, 1933) male who presents for right TMA wound check.  This was performed on 02/05/2020.  He had a diagnostic arteriogram prior to that which showed tibial vessel disease but no indication for revascularization at the time.  He continues to wear his Darco shoe.  He has had pulsatile bleeding in the past month from a skin edge of his TMA.  He denies any fevers, chills, nausea/vomiting.  His wife is helping him with dressing changes.   Past Medical History:  Diagnosis Date  . Anemia    a. mild, noted 04/2017.  [name] CAD in native artery    a. [name] 04/2017 s/p DES to D1, DES to prox-mid LAD, DES to prox LAD overlapping the prior stent, LVEF 55-65%.   . Chronic diastolic CHF (congestive heart failure) ([place])   . Constipation   . COPD (chronic obstructive pulmonary disease) ([place])   . Diabetic ulcer of toe ([place])   . DJD (degenerative joint disease) of cervical spine   . Dysrhythmia    AFib  . Essential hypertension   . GERD (gastroesophageal reflux disease)   . History of hiatal hernia   . HIT (heparin-induced thrombocytopenia) ([place])   . Hypothyroidism   . Hypoxia    a. went home on home O2 04/2017.  [name] Insomnia   . Mixed hyperlipidemia   . PAD (peripheral artery disease) ([place])   . PAF (paroxysmal atrial fibrillation) ([place])   . PVD (peripheral vascular disease) ([place])   . Renal insufficiency   . 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  . Retinitis   . Sinus bradycardia    a. HR 30s-40s in 04/2017 -> diltiazem stopped, metoprolol reduced.  . Sleep apnea    "chose not to order CPAP at this time" (05/18/2017)  . Type 2 diabetes mellitus ([place])   . Wears glasses   . Wheezing    a. suspected COPD 04/2017. Former tobacco x 40 years.    Past Surgical History:  Procedure Laterality Date  . ABDOMINAL AORTOGRAM W/LOWER EXTREMITY N/A 09/15/2017   Procedure: ABDOMINAL AORTOGRAM W/LOWER  EXTREMITY;  Surgeon: Serafina Mitchell, MD;  Location: [place] [facility];  Service: Cardiovascular;  Laterality: N/A;  . ABDOMINAL AORTOGRAM W/LOWER EXTREMITY N/A 02/04/2020   Procedure: ABDOMINAL AORTOGRAM W/LOWER EXTREMITY;  Surgeon: Serafina Mitchell, MD;  Location: [place] [facility];  Service: Cardiovascular;  Laterality: N/A;  . AMPUTATION Bilateral 09/20/2017   Procedure: BILATERAL GREAT TOE AMPUTATIONS INCLUDING METATARSAL HEADS;  Surgeon: Serafina Mitchell, MD;  [facility];  Service: Vascular;  Laterality: Bilateral;  . AMPUTATION Right 03/01/2019   Procedure: AMPUTATION TOES;  Surgeon: Angelia Mould, MD;  Location: [place];  Service: Vascular;  Laterality: Right;  . AMPUTATION Right 05/29/2019   Procedure: AMPUTATION RIGHT THIRD TOE;  Surgeon: Serafina Mitchell, MD;  Location: [facility];  Service: Vascular;  Laterality: Right;  . AMPUTATION Right 09/26/2019   Procedure: AMPUTATION RIGHT FOURTH TOE AND FIFTH TOES;  Surgeon: Serafina Mitchell, MD;  [facility];  Service: Vascular;  Laterality: Right;  . ANGIOPLASTY Right 09/20/2017   Procedure: ANGIOPLASTY RIGHT TIBIAL ARTERY;  Surgeon: Serafina Mitchell, MD;  Location: [place];  Service: Vascular;  Laterality: Right;  . APPENDECTOMY    . BACK SURGERY    . CARDIAC CATHETERIZATION  1980s; 2012;  . CATARACT EXTRACTION  Left 2004  . COLONOSCOPY  2004   FLEISHMAN TICS  . COLONOSCOPY WITH PROPOFOL N/A 11/28/2018   Procedure: COLONOSCOPY WITH PROPOFOL;  Surgeon: Rogene Houston, MD;  [facility];  Service: Endoscopy;  Laterality: N/A;  . CORONARY ANGIOPLASTY WITH STENT PLACEMENT  05/18/2017   "3 stents"  . CORONARY STENT INTERVENTION N/A 05/18/2017   Procedure: CORONARY STENT INTERVENTION;  Surgeon: Jettie Booze, MD;  Location: [place] [facility];  Service: Cardiovascular;  Laterality: N/A;  . ESOPHAGOGASTRODUODENOSCOPY (EGD) WITH PROPOFOL N/A 11/20/2017   Procedure: ESOPHAGOGASTRODUODENOSCOPY (EGD) WITH PROPOFOL;  Surgeon:  Irene Shipper, MD;  Location: [place];  Service: Gastroenterology;  Laterality: N/A;  . ESOPHAGOGASTRODUODENOSCOPY (EGD) WITH PROPOFOL N/A 11/28/2018   Procedure: ESOPHAGOGASTRODUODENOSCOPY (EGD) WITH PROPOFOL;  Surgeon: Rogene Houston, MD;  [facility];  Service: Endoscopy;  Laterality: N/A;  . ESOPHAGOGASTRODUODENOSCOPY (EGD) WITH PROPOFOL N/A 07/12/2019   Procedure: ESOPHAGOGASTRODUODENOSCOPY (EGD) WITH PROPOFOL;  Surgeon: Rogene Houston, MD;  [facility];  Service: Endoscopy;  Laterality: N/A;  125  . GIVENS CAPSULE STUDY N/A 12/17/2018   Procedure: GIVENS CAPSULE STUDY;  Surgeon: Rogene Houston, MD;  [facility];  Service: Endoscopy;  Laterality: N/A;  7:30am  . I & D EXTREMITY Right 12/17/2017   Procedure: IRRIGATION AND DEBRIDEMENT RIGHT GREAT TOE;  Surgeon: Serafina Mitchell, MD;  [facility];  Service: Vascular;  Laterality: Right;  . JOINT REPLACEMENT    . LEFT HEART CATH AND CORONARY ANGIOGRAPHY N/A 05/18/2017   Procedure: LEFT HEART CATH AND CORONARY ANGIOGRAPHY;  Surgeon: Jettie Booze, MD;  Location: [place] [facility];  Service: Cardiovascular;  Laterality: N/A;  . LOWER EXTREMITY ANGIOGRAM Right 09/20/2017   Procedure: RIGHT LOWER LEG  ANGIOGRAM;  Surgeon: Serafina Mitchell, MD;  Location: [place];  Service: Vascular;  Laterality: Right;  . LUMBAR FUSION  2002   L3, 4 L4, 5 L5 S1 Fused by Dr. Glenna Fellows  . PERIPHERAL VASCULAR BALLOON ANGIOPLASTY Left 09/15/2017   Procedure: PERIPHERAL VASCULAR BALLOON ANGIOPLASTY;  Surgeon: Serafina Mitchell, MD;  Location: [place] [facility];  Service: Cardiovascular;  Laterality: Left;  PTA of Peroneal & Posterior Tibial  . POLYPECTOMY  11/28/2018   Procedure: POLYPECTOMY;  Surgeon: Rogene Houston, MD;  [facility];  Service: Endoscopy;;  duodenum  . POSTERIOR LUMBAR FUSION    . RHINOPLASTY    . RIGHT HEART CATH N/A 05/18/2017   Procedure: RIGHT HEART CATH;  Surgeon: Jettie Booze,  MD;  Location: [place] [facility];  Service: Cardiovascular;  Laterality: N/A;  . TEE WITHOUT CARDIOVERSION N/A 09/18/2017   Procedure: TRANSESOPHAGEAL ECHOCARDIOGRAM (TEE);  Surgeon: Fay Records, MD;  Location: [place];  Service: Cardiovascular;  Laterality: N/A;  . TOTAL KNEE ARTHROPLASTY Bilateral   . TRANSMETATARSAL AMPUTATION Right 02/04/2020   Procedure: RIGHT TRANSMETATARSAL AMPUTATION;  Surgeon: Serafina Mitchell, MD;  Location: [place];  Service: Vascular;  Laterality: Right;  . TRANSURETHRAL RESECTION OF PROSTATE  2001   [place]    Social History   Socioeconomic History  . Marital status: Married    Spouse name: Not on file  . Number of children: Not on file  . Years of education: Not on file  . Highest education level: Not on file  Occupational History  . Not on file  Tobacco Use  . Smoking status: Former Smoker    Packs/day: 2.00    Years: 30.00  Pack years: 60.00    Types: Cigarettes    Start date: 03/28/1953    Quit date: 03/29/1983    Years since quitting: 37.2  . Smokeless tobacco: Never Used  Vaping Use  . Vaping Use: Never used  Substance and Sexual Activity  . Alcohol use: Not Currently    Comment: 05/18/2017 'I drink a beer q yr"  . Drug use: No  . Sexual activity: Not Currently  Other Topics Concern  . Not on file  Social History Narrative   Patient lives at home with his spouse.    Social Determinants of Health   Financial Resource Strain: Not on file  Food Insecurity: Not on file  Transportation Needs: No Transportation Needs  . Lack of Transportation (Medical): No  . Lack of Transportation (Non-Medical): No  Physical Activity: Not on file  Stress: No Stress Concern Present  . Feeling of Stress : Not at all  Social Connections: Not on file  Intimate Partner Violence: Not on file    Family History  Problem Relation Age of Onset  . Heart attack Father 26  . COPD Father   . COPD Mother   . Heart disease Mother   . Diabetes Mother   .  Hypertension Sister   . CVA Sister   . Diabetes Sister   . Multiple sclerosis Sister     Current Outpatient Medications  Medication Sig Dispense Refill  . Accu-Chek Softclix Lancets lancets     . acetaminophen (TYLENOL) 500 MG tablet Take 1 tablet (500 mg total) by mouth every 6 (six) hours as needed for mild pain or headache. 30 tablet 0  . Alcohol Swabs (B-D SINGLE USE SWABS REGULAR) PADS     . atorvastatin (LIPITOR) 80 MG tablet Take 80 mg by mouth every evening.     . Blood Glucose Monitoring Suppl (ACCU-CHEK GUIDE) w/Device KIT     . Cholecalciferol (VITAMIN D3) 50 MCG (2000 UT) TABS Take 2,000 Units by mouth daily.    . clopidogrel (PLAVIX) 75 MG tablet Take 75 mg by mouth daily.    . Cyanocobalamin (B-12) 2500 MCG TABS Take 2,500 mcg by mouth daily.    . DROPLET INSULIN SYRINGE 31G X 5/16" 1 ML MISC     . ELIQUIS 5 MG TABS tablet TAKE 1 TABLET TWICE DAILY 180 tablet 3  . finasteride (PROSCAR) 5 MG tablet Take 5 mg by mouth daily.    . folic acid (FOLVITE) 1 MG tablet Take 1 tablet (1 mg total) by mouth daily. 30 tablet 1  . furosemide (LASIX) 80 MG tablet Take 80 mg by mouth See admin instructions. Take 80 mg by mouth in the morning and between 5-6 PM daily    . insulin glargine (LANTUS) 100 UNIT/ML injection Inject 30-40 Units into the skin See admin instructions. Inject 30 units into the skin in the morning and 40 units at bedtime    . ipratropium-albuterol (DUONEB) 0.5-2.5 (3) MG/3ML SOLN Take 3 mLs by nebulization every evening.     . isosorbide mononitrate (IMDUR) 30 MG 24 hr tablet Take 1.5 tablets (45 mg total) by mouth in the morning and at bedtime. 270 tablet 3  . lisinopril (ZESTRIL) 5 MG tablet Take 0.5 tablets (2.5 mg total) by mouth daily.    . meclizine (ANTIVERT) 25 MG tablet Take 25 mg by mouth daily as needed for dizziness.    . metFORMIN (GLUCOPHAGE) 1000 MG tablet Take 1,000 mg by mouth 2 (two) times daily.    [name]  OXYGEN Inhale 2 L/min into the lungs See admin  instructions. 2 L/min of oxygen at bedtime and during the day as needed for shortness of breath    . pantoprazole (PROTONIX) 40 MG tablet Take 1 tablet (40 mg total) by mouth 2 (two) times daily. To protect stomach while taking multiple blood thinners. 60 tablet 2  . potassium chloride SA (K-DUR) 20 MEQ tablet Take 1 tablet (20 mEq total) by mouth daily. 30 tablet 1  . psyllium (METAMUCIL) 58.6 % packet Take 0.5 packets by mouth 2 (two) times daily. [place]    . SPIRIVA HANDIHALER 18 MCG inhalation capsule Place 1 capsule into inhaler and inhale daily after breakfast.     . traMADol (ULTRAM) 50 MG tablet Take 1 tablet (50 mg total) by mouth every 8 (eight) hours as needed. 20 tablet 0  . traZODone (DESYREL) 100 MG tablet Take 100-200 mg by mouth at bedtime.     . TRUE METRIX BLOOD GLUCOSE TEST test strip     . nitroGLYCERIN (NITROSTAT) 0.4 MG SL tablet PLACE 1 TABLET (0.4 MG TOTAL) UNDER THE TONGUE EVERY 5  MINUTES AS NEEDED FOR CHEST PAIN. (Patient not taking: Reported on 06/15/2020) 25 tablet 3   No current facility-administered medications for this visit.    Allergies  Allergen Reactions  . Bactrim [Sulfamethoxazole-Trimethoprim] Other (See Comments)    Pancytopenia  . Codeine Shortness Of Breath  . Doxycycline Swelling    Swelling and numbness in lips and face. Swelling improved after stopping. Reports still experiences numbness in bottom lip.   Shirlean Kelly [Ferumoxytol] Other (See Comments)    Diaphoretic, chest pain  . Heparin Other (See Comments)    +HIT,  Severe bleeding (with heparin drip & large doses), tolerates low doses  . Losartan Swelling  . Oxycodone Other (See Comments)    "Made me act out of my mind" Mental status changes- hallucinations  . Latex Rash     REVIEW OF SYSTEMS:   _0  denotes positive finding, _1  denotes negative finding Cardiac  Comments:  Chest pain or chest pressure:    Shortness of breath upon exertion:    Short of breath when lying flat:     Irregular heart rhythm:        Vascular    Pain in calf, thigh, or hip brought on by ambulation:    Pain in feet at night that wakes you up from your sleep:     Blood clot in your veins:    Leg swelling:         Pulmonary    Oxygen at home:    Productive cough:     Wheezing:         Neurologic    Sudden weakness in arms or legs:     Sudden numbness in arms or legs:     Sudden onset of difficulty speaking or slurred speech:    Temporary loss of vision in one eye:     Problems with dizziness:         Gastrointestinal    Blood in stool:     Vomited blood:         Genitourinary    Burning when urinating:     Blood in urine:        Psychiatric    Major depression:         Hematologic    Bleeding problems:    Problems with blood clotting too easily:  Skin    Rashes or ulcers:        Constitutional    Fever or chills:      PHYSICAL EXAMINATION:  Vitals:   06/15/20 1318  BP: 124/66  Pulse: 60  Resp: 18  Temp: (!) 97.2 F (36.2 C)  TempSrc: Temporal  SpO2: 96%  Weight: 182 lb (82.6 kg)  Height: _0  (1.702 m)    General:  WDWN in NAD; vital signs documented above Gait: Not observed HENT: WNL, normocephalic Pulmonary: normal non-labored breathing Cardiac: regular HR Abdomen: soft, NT, no masses Skin: without rashes Extremities: see picture below Musculoskeletal: no muscle wasting or atrophy  Neurologic: A&O X 3;  No focal weakness or paresthesias are detected Psychiatric:  The pt has Normal affect.      Non-Invasive Vascular Imaging:   ABI/TBIToday's ABIToday's TBI Previous ABIPrevious TBI  +-------+-----------+------------+------------+------------+  Right 1.13    -      0.71    amputated    +-------+-----------+------------+------------+------------+  Left  1.13    0.50 2nd toe1.10    amputated    307cm/s prox SFA   ASSESSMENT/PLAN:: 87 y.o. male status post right TMA  Lateral TMA debrided in  office; no purulence or other sign of infection Right foot well-perfused based on imaging studies Follow-up in 4 to 6 weeks for recheck   Dagoberto Ligas, PA-C [facility] [phone]  Clinic MD:   Trula Slade

## 2020-06-16 ENCOUNTER — Other Ambulatory Visit: Payer: Self-pay

## 2020-06-16 DIAGNOSIS — J449 Chronic obstructive pulmonary disease, unspecified: Secondary | ICD-10-CM | POA: Diagnosis not present

## 2020-06-16 DIAGNOSIS — I5032 Chronic diastolic (congestive) heart failure: Secondary | ICD-10-CM | POA: Diagnosis not present

## 2020-06-16 DIAGNOSIS — N1831 Chronic kidney disease, stage 3a: Secondary | ICD-10-CM | POA: Diagnosis not present

## 2020-06-16 DIAGNOSIS — I130 Hypertensive heart and chronic kidney disease with heart failure and stage 1 through stage 4 chronic kidney disease, or unspecified chronic kidney disease: Secondary | ICD-10-CM | POA: Diagnosis not present

## 2020-06-16 DIAGNOSIS — D631 Anemia in chronic kidney disease: Secondary | ICD-10-CM | POA: Diagnosis not present

## 2020-06-16 DIAGNOSIS — M869 Osteomyelitis, unspecified: Secondary | ICD-10-CM | POA: Diagnosis not present

## 2020-06-16 DIAGNOSIS — T8743 Infection of amputation stump, right lower extremity: Secondary | ICD-10-CM | POA: Diagnosis not present

## 2020-06-16 DIAGNOSIS — E1169 Type 2 diabetes mellitus with other specified complication: Secondary | ICD-10-CM | POA: Diagnosis not present

## 2020-06-16 DIAGNOSIS — E1122 Type 2 diabetes mellitus with diabetic chronic kidney disease: Secondary | ICD-10-CM | POA: Diagnosis not present

## 2020-06-16 NOTE — Patient Outreach (Signed)
[facility]) Care Management  06/16/2020  Mike Wright 09/11/1933 [id number]   Telephone call to patient for follow up. No answer.  Unable to leave a message.  Plan: RN CM will attempt patient again in April.    [name], RN, MSN [place] Management Care Management Coordinator Direct Line [phone] Cell [phone] Toll Free: [phone]  Fax: [phone]

## 2020-06-18 ENCOUNTER — Ambulatory Visit: Payer: Self-pay

## 2020-06-19 DIAGNOSIS — I130 Hypertensive heart and chronic kidney disease with heart failure and stage 1 through stage 4 chronic kidney disease, or unspecified chronic kidney disease: Secondary | ICD-10-CM | POA: Diagnosis not present

## 2020-06-19 DIAGNOSIS — T8743 Infection of amputation stump, right lower extremity: Secondary | ICD-10-CM | POA: Diagnosis not present

## 2020-06-19 DIAGNOSIS — I5032 Chronic diastolic (congestive) heart failure: Secondary | ICD-10-CM | POA: Diagnosis not present

## 2020-06-19 DIAGNOSIS — M869 Osteomyelitis, unspecified: Secondary | ICD-10-CM | POA: Diagnosis not present

## 2020-06-19 DIAGNOSIS — N1831 Chronic kidney disease, stage 3a: Secondary | ICD-10-CM | POA: Diagnosis not present

## 2020-06-19 DIAGNOSIS — D631 Anemia in chronic kidney disease: Secondary | ICD-10-CM | POA: Diagnosis not present

## 2020-06-19 DIAGNOSIS — J449 Chronic obstructive pulmonary disease, unspecified: Secondary | ICD-10-CM | POA: Diagnosis not present

## 2020-06-19 DIAGNOSIS — E1169 Type 2 diabetes mellitus with other specified complication: Secondary | ICD-10-CM | POA: Diagnosis not present

## 2020-06-19 DIAGNOSIS — E1122 Type 2 diabetes mellitus with diabetic chronic kidney disease: Secondary | ICD-10-CM | POA: Diagnosis not present

## 2020-06-21 DIAGNOSIS — I509 Heart failure, unspecified: Secondary | ICD-10-CM | POA: Diagnosis not present

## 2020-06-24 DIAGNOSIS — E1122 Type 2 diabetes mellitus with diabetic chronic kidney disease: Secondary | ICD-10-CM | POA: Diagnosis not present

## 2020-06-24 DIAGNOSIS — M869 Osteomyelitis, unspecified: Secondary | ICD-10-CM | POA: Diagnosis not present

## 2020-06-24 DIAGNOSIS — I5032 Chronic diastolic (congestive) heart failure: Secondary | ICD-10-CM | POA: Diagnosis not present

## 2020-06-24 DIAGNOSIS — I130 Hypertensive heart and chronic kidney disease with heart failure and stage 1 through stage 4 chronic kidney disease, or unspecified chronic kidney disease: Secondary | ICD-10-CM | POA: Diagnosis not present

## 2020-06-24 DIAGNOSIS — D631 Anemia in chronic kidney disease: Secondary | ICD-10-CM | POA: Diagnosis not present

## 2020-06-24 DIAGNOSIS — N1831 Chronic kidney disease, stage 3a: Secondary | ICD-10-CM | POA: Diagnosis not present

## 2020-06-24 DIAGNOSIS — E1169 Type 2 diabetes mellitus with other specified complication: Secondary | ICD-10-CM | POA: Diagnosis not present

## 2020-06-24 DIAGNOSIS — T8743 Infection of amputation stump, right lower extremity: Secondary | ICD-10-CM | POA: Diagnosis not present

## 2020-06-24 DIAGNOSIS — J449 Chronic obstructive pulmonary disease, unspecified: Secondary | ICD-10-CM | POA: Diagnosis not present

## 2020-06-26 DIAGNOSIS — M869 Osteomyelitis, unspecified: Secondary | ICD-10-CM | POA: Diagnosis not present

## 2020-06-26 DIAGNOSIS — E1122 Type 2 diabetes mellitus with diabetic chronic kidney disease: Secondary | ICD-10-CM | POA: Diagnosis not present

## 2020-06-26 DIAGNOSIS — J449 Chronic obstructive pulmonary disease, unspecified: Secondary | ICD-10-CM | POA: Diagnosis not present

## 2020-06-26 DIAGNOSIS — N1831 Chronic kidney disease, stage 3a: Secondary | ICD-10-CM | POA: Diagnosis not present

## 2020-06-26 DIAGNOSIS — E1169 Type 2 diabetes mellitus with other specified complication: Secondary | ICD-10-CM | POA: Diagnosis not present

## 2020-06-26 DIAGNOSIS — I130 Hypertensive heart and chronic kidney disease with heart failure and stage 1 through stage 4 chronic kidney disease, or unspecified chronic kidney disease: Secondary | ICD-10-CM | POA: Diagnosis not present

## 2020-06-26 DIAGNOSIS — D631 Anemia in chronic kidney disease: Secondary | ICD-10-CM | POA: Diagnosis not present

## 2020-06-26 DIAGNOSIS — T8743 Infection of amputation stump, right lower extremity: Secondary | ICD-10-CM | POA: Diagnosis not present

## 2020-06-26 DIAGNOSIS — I5032 Chronic diastolic (congestive) heart failure: Secondary | ICD-10-CM | POA: Diagnosis not present

## 2020-06-29 ENCOUNTER — Other Ambulatory Visit: Payer: Self-pay

## 2020-06-29 NOTE — Patient Instructions (Signed)
Goals    .  THN-Make and Keep All Appointments      Timeframe:  Long-Range Goal Priority:  High Start Date:  03/24/20                           Expected End Date:   11/25/20          Follow Up Date 08/25/20   - call to cancel if needed    Why is this important?   Part of staying healthy is seeing the doctor for follow-up care.  If you forget your appointments, there are some things you can do to stay on track.    Notes: 05/02/20 Patient sees physicians as scheduled.  No transportation problems.    .  THN-Monitor foot wound for infection (pt-stated)      Timeframe:  Short-Term Goal Priority:  High Start Date:   03/24/20                          Expected End Date:   08/25/20               Follow Up Date 08/25/20   - clean and dry skin well       Why is this important?    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    Taking really good care of your skin will help to keep your skin unbroken.    Notes: Patient denies signs of infection.  Wet to dry dressings daily. Wound closing up slowly.  Home health nurse 2/week. 04/15/20-patient with recent debridement of right foot surgical site.  Area now red with some bleeding to site per patient.  Santyl dressing daily. 05/02/20 Patient reports wound is doing good.  He did have a bleeding episode that was stopped with pressure to site. Home health continues 2/wk visits.   06/29/20 Wound almost healed.  Discussed continued use of boot to relieve pressure.    .  THN-Track and Manage Symptoms      Timeframe:  Long-Range Goal Priority:  High Start Date:  03/24/20                           Expected End Date:  11/25/20             Follow Up Date 08/25/20   - follow rescue plan if symptoms flare-up - know when to call the doctor - track symptoms and what helps feel better or worse    Why is this important?   You will be able to handle your symptoms better if you keep track of them.  Making some simple changes to your lifestyle will  help.  Eating healthy is one thing you can do to take good care of yourself.    Notes: 04/15/20 Weight 177 lbs.  05-02-20 Patient weight 179 lbs.   06/29/20 Patient weight 180 lbs. Keep up the great work!

## 2020-06-29 NOTE — Patient Outreach (Signed)
[facility]) Care Management  [place]  06/29/2020   Mike Wright December 13, 1933 [id number]  Subjective: Telephone call to patient for follow up. Patient reports doing good. He reports foot almost healed to see MD again on 07-16-20 for evaluation.  Weight at 180 lbs.  Discussed heart failure. Encouraged patient to keep up the great work.    Objective:   Encounter Medications:  Outpatient Encounter Medications as of 06/29/2020  Medication Sig Note  . Accu-Chek Softclix Lancets lancets    . acetaminophen (TYLENOL) 500 MG tablet Take 1 tablet (500 mg total) by mouth every 6 (six) hours as needed for mild pain or headache.   . Alcohol Swabs (B-D SINGLE USE SWABS REGULAR) PADS    . atorvastatin (LIPITOR) 80 MG tablet Take 80 mg by mouth every evening.    . Blood Glucose Monitoring Suppl (ACCU-CHEK GUIDE) w/Device KIT    . Cholecalciferol (VITAMIN D3) 50 MCG (2000 UT) TABS Take 2,000 Units by mouth daily.   . clopidogrel (PLAVIX) 75 MG tablet Take 75 mg by mouth daily.   . Cyanocobalamin (B-12) 2500 MCG TABS Take 2,500 mcg by mouth daily.   . DROPLET INSULIN SYRINGE 31G X 5/16" 1 ML MISC    . ELIQUIS 5 MG TABS tablet TAKE 1 TABLET TWICE DAILY   . finasteride (PROSCAR) 5 MG tablet Take 5 mg by mouth daily.   . folic acid (FOLVITE) 1 MG tablet Take 1 tablet (1 mg total) by mouth daily.   . furosemide (LASIX) 80 MG tablet Take 80 mg by mouth See admin instructions. Take 80 mg by mouth in the morning and between 5-6 PM daily   . insulin glargine (LANTUS) 100 UNIT/ML injection Inject 30-40 Units into the skin See admin instructions. Inject 30 units into the skin in the morning and 40 units at bedtime 02/02/2020: Regimen confirmed to be accurate by the patient  . ipratropium-albuterol (DUONEB) 0.5-2.5 (3) MG/3ML SOLN Take 3 mLs by nebulization every evening.    . isosorbide mononitrate (IMDUR) 30 MG 24 hr tablet Take 1.5 tablets (45 mg total) by mouth in the morning and at  bedtime.   [name] linezolid (ZYVOX) 600 MG tablet TAKE 1 TABLET (600 MG TOTAL) BY MOUTH EVERY TWELVE HOURS FOR 10 DAYS.   [name] lisinopril (ZESTRIL) 5 MG tablet Take 0.5 tablets (2.5 mg total) by mouth daily.   . meclizine (ANTIVERT) 25 MG tablet Take 25 mg by mouth daily as needed for dizziness.   . metFORMIN (GLUCOPHAGE) 1000 MG tablet Take 1,000 mg by mouth 2 (two) times daily.   . nitroGLYCERIN (NITROSTAT) 0.4 MG SL tablet PLACE 1 TABLET (0.4 MG TOTAL) UNDER THE TONGUE EVERY 5  MINUTES AS NEEDED FOR CHEST PAIN. (Patient not taking: Reported on 06/15/2020)   . OXYGEN Inhale 2 L/min into the lungs See admin instructions. 2 L/min of oxygen at bedtime and during the day as needed for shortness of breath   . pantoprazole (PROTONIX) 40 MG tablet Take 1 tablet (40 mg total) by mouth 2 (two) times daily. To protect stomach while taking multiple blood thinners.   . potassium chloride SA (K-DUR) 20 MEQ tablet Take 1 tablet (20 mEq total) by mouth daily.   . psyllium (METAMUCIL) 58.6 % packet Take 0.5 packets by mouth 2 (two) times daily. [place]   . SPIRIVA HANDIHALER 18 MCG inhalation capsule Place 1 capsule into inhaler and inhale daily after breakfast.    . traMADol (ULTRAM) 50 MG  tablet Take 1 tablet (50 mg total) by mouth every 8 (eight) hours as needed.   . traZODone (DESYREL) 100 MG tablet Take 100-200 mg by mouth at bedtime.    [name] METRIX BLOOD GLUCOSE TEST test strip     No facility-administered encounter medications on file as of 06/29/2020.    Functional Status:  In your present state of health, do you have any difficulty performing the following activities: 02/10/2020 02/03/2020  Hearing? N N  Vision? N N  Comment - -  Difficulty concentrating or making decisions? N N  Walking or climbing stairs? Y Y  Comment weakness and amputation -  Dressing or bathing? N N  Doing errands, shopping? N N  Preparing Food and eating ? N -  Using the Toilet? N -  In the past six months, have you  accidently leaked urine? Y -  Comment wears depends -  Do you have problems with loss of bowel control? N -  Managing your Medications? N -  Managing your Finances? N -  Housekeeping or managing your Housekeeping? Y -  Comment wife assists -  Some recent data might be hidden    Fall/Depression Screening: Fall Risk  03/10/2020 02/10/2020 08/14/2019  Falls in the past year? 1 1 1   Comment - - week ago in the yard no injuries  Number falls in past yr: 1 0 0  Injury with Fall? 0 0 0  Risk for fall due to : Orthopedic patient - -  Follow up Falls prevention discussed - Falls prevention discussed   PHQ 2/9 Scores 02/10/2020 11/07/2019 07/29/2019 06/12/2019 12/20/2018  PHQ - 2 Score 0 0 0 0 0    Assessment:  Goals Addressed              This Visit's Progress   .  THN-Make and Keep All Appointments   On track     Timeframe:  Long-Range Goal Priority:  High Start Date:  03/24/20                           Expected End Date:   11/25/20          Follow Up Date 08/25/20   - call to cancel if needed    Why is this important?   Part of staying healthy is seeing the doctor for follow-up care.  If you forget your appointments, there are some things you can do to stay on track.    Notes: 05/02/20 Patient sees physicians as scheduled.  No transportation problems.    .  THN-Monitor foot wound for infection (pt-stated)   On track     Timeframe:  Short-Term Goal Priority:  High Start Date:   03/24/20                          Expected End Date:   08/25/20               Follow Up Date 08/25/20   - clean and dry skin well       Why is this important?    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    Taking really good care of your skin will help to keep your skin unbroken.    Notes: Patient denies signs of infection.  Wet to dry dressings daily. Wound closing up slowly.  Home health nurse 2/week. 04/15/20-patient with recent debridement of  right foot surgical site.  Area now  red with some bleeding to site per patient.  Santyl dressing daily. 05/02/20 Patient reports wound is doing good.  He did have a bleeding episode that was stopped with pressure to site. Home health continues 2/wk visits.   06/29/20 Wound almost healed.  Discussed continued use of boot to relieve pressure.    [name]  THN-Track and Manage Symptoms   On track     Timeframe:  Long-Range Goal Priority:  High Start Date:  03/24/20                           Expected End Date:  11/25/20             Follow Up Date 08/25/20   - follow rescue plan if symptoms flare-up - know when to call the doctor - track symptoms and what helps feel better or worse    Why is this important?   You will be able to handle your symptoms better if you keep track of them.  Making some simple changes to your lifestyle will help.  Eating healthy is one thing you can do to take good care of yourself.    Notes: 04/15/20 Weight 177 lbs.  05-02-20 Patient weight 179 lbs.   06/29/20 Patient weight 180 lbs. Keep up the great work!       Plan: RN CM will follow up in May. Follow-up:  Patient agrees to Care Plan and Follow-up.   [name], RN, MSN [place] Management Care Management Coordinator Direct Line [phone] Cell [phone] Toll Free: [phone]  Fax: [phone]

## 2020-06-30 DIAGNOSIS — M869 Osteomyelitis, unspecified: Secondary | ICD-10-CM | POA: Diagnosis not present

## 2020-06-30 DIAGNOSIS — D631 Anemia in chronic kidney disease: Secondary | ICD-10-CM | POA: Diagnosis not present

## 2020-06-30 DIAGNOSIS — E1122 Type 2 diabetes mellitus with diabetic chronic kidney disease: Secondary | ICD-10-CM | POA: Diagnosis not present

## 2020-06-30 DIAGNOSIS — I5032 Chronic diastolic (congestive) heart failure: Secondary | ICD-10-CM | POA: Diagnosis not present

## 2020-06-30 DIAGNOSIS — I130 Hypertensive heart and chronic kidney disease with heart failure and stage 1 through stage 4 chronic kidney disease, or unspecified chronic kidney disease: Secondary | ICD-10-CM | POA: Diagnosis not present

## 2020-06-30 DIAGNOSIS — T8743 Infection of amputation stump, right lower extremity: Secondary | ICD-10-CM | POA: Diagnosis not present

## 2020-06-30 DIAGNOSIS — J449 Chronic obstructive pulmonary disease, unspecified: Secondary | ICD-10-CM | POA: Diagnosis not present

## 2020-06-30 DIAGNOSIS — E1169 Type 2 diabetes mellitus with other specified complication: Secondary | ICD-10-CM | POA: Diagnosis not present

## 2020-06-30 DIAGNOSIS — N1831 Chronic kidney disease, stage 3a: Secondary | ICD-10-CM | POA: Diagnosis not present

## 2020-07-02 ENCOUNTER — Ambulatory Visit: Payer: Self-pay

## 2020-07-03 DIAGNOSIS — T8743 Infection of amputation stump, right lower extremity: Secondary | ICD-10-CM | POA: Diagnosis not present

## 2020-07-03 DIAGNOSIS — E1122 Type 2 diabetes mellitus with diabetic chronic kidney disease: Secondary | ICD-10-CM | POA: Diagnosis not present

## 2020-07-03 DIAGNOSIS — I130 Hypertensive heart and chronic kidney disease with heart failure and stage 1 through stage 4 chronic kidney disease, or unspecified chronic kidney disease: Secondary | ICD-10-CM | POA: Diagnosis not present

## 2020-07-03 DIAGNOSIS — N1831 Chronic kidney disease, stage 3a: Secondary | ICD-10-CM | POA: Diagnosis not present

## 2020-07-03 DIAGNOSIS — D631 Anemia in chronic kidney disease: Secondary | ICD-10-CM | POA: Diagnosis not present

## 2020-07-03 DIAGNOSIS — M869 Osteomyelitis, unspecified: Secondary | ICD-10-CM | POA: Diagnosis not present

## 2020-07-03 DIAGNOSIS — J449 Chronic obstructive pulmonary disease, unspecified: Secondary | ICD-10-CM | POA: Diagnosis not present

## 2020-07-03 DIAGNOSIS — I5032 Chronic diastolic (congestive) heart failure: Secondary | ICD-10-CM | POA: Diagnosis not present

## 2020-07-03 DIAGNOSIS — E1169 Type 2 diabetes mellitus with other specified complication: Secondary | ICD-10-CM | POA: Diagnosis not present

## 2020-07-07 DIAGNOSIS — E1122 Type 2 diabetes mellitus with diabetic chronic kidney disease: Secondary | ICD-10-CM | POA: Diagnosis not present

## 2020-07-07 DIAGNOSIS — I130 Hypertensive heart and chronic kidney disease with heart failure and stage 1 through stage 4 chronic kidney disease, or unspecified chronic kidney disease: Secondary | ICD-10-CM | POA: Diagnosis not present

## 2020-07-07 DIAGNOSIS — J449 Chronic obstructive pulmonary disease, unspecified: Secondary | ICD-10-CM | POA: Diagnosis not present

## 2020-07-07 DIAGNOSIS — I5032 Chronic diastolic (congestive) heart failure: Secondary | ICD-10-CM | POA: Diagnosis not present

## 2020-07-07 DIAGNOSIS — M869 Osteomyelitis, unspecified: Secondary | ICD-10-CM | POA: Diagnosis not present

## 2020-07-07 DIAGNOSIS — D631 Anemia in chronic kidney disease: Secondary | ICD-10-CM | POA: Diagnosis not present

## 2020-07-07 DIAGNOSIS — T8743 Infection of amputation stump, right lower extremity: Secondary | ICD-10-CM | POA: Diagnosis not present

## 2020-07-07 DIAGNOSIS — E1169 Type 2 diabetes mellitus with other specified complication: Secondary | ICD-10-CM | POA: Diagnosis not present

## 2020-07-07 DIAGNOSIS — N1831 Chronic kidney disease, stage 3a: Secondary | ICD-10-CM | POA: Diagnosis not present

## 2020-07-09 DIAGNOSIS — E1169 Type 2 diabetes mellitus with other specified complication: Secondary | ICD-10-CM | POA: Diagnosis not present

## 2020-07-09 DIAGNOSIS — I130 Hypertensive heart and chronic kidney disease with heart failure and stage 1 through stage 4 chronic kidney disease, or unspecified chronic kidney disease: Secondary | ICD-10-CM | POA: Diagnosis not present

## 2020-07-09 DIAGNOSIS — D631 Anemia in chronic kidney disease: Secondary | ICD-10-CM | POA: Diagnosis not present

## 2020-07-09 DIAGNOSIS — J449 Chronic obstructive pulmonary disease, unspecified: Secondary | ICD-10-CM | POA: Diagnosis not present

## 2020-07-09 DIAGNOSIS — N1831 Chronic kidney disease, stage 3a: Secondary | ICD-10-CM | POA: Diagnosis not present

## 2020-07-09 DIAGNOSIS — E1122 Type 2 diabetes mellitus with diabetic chronic kidney disease: Secondary | ICD-10-CM | POA: Diagnosis not present

## 2020-07-09 DIAGNOSIS — M869 Osteomyelitis, unspecified: Secondary | ICD-10-CM | POA: Diagnosis not present

## 2020-07-09 DIAGNOSIS — I5032 Chronic diastolic (congestive) heart failure: Secondary | ICD-10-CM | POA: Diagnosis not present

## 2020-07-09 DIAGNOSIS — T8743 Infection of amputation stump, right lower extremity: Secondary | ICD-10-CM | POA: Diagnosis not present

## 2020-07-15 DIAGNOSIS — T8743 Infection of amputation stump, right lower extremity: Secondary | ICD-10-CM | POA: Diagnosis not present

## 2020-07-15 DIAGNOSIS — M869 Osteomyelitis, unspecified: Secondary | ICD-10-CM | POA: Diagnosis not present

## 2020-07-15 DIAGNOSIS — E1169 Type 2 diabetes mellitus with other specified complication: Secondary | ICD-10-CM | POA: Diagnosis not present

## 2020-07-15 DIAGNOSIS — E1122 Type 2 diabetes mellitus with diabetic chronic kidney disease: Secondary | ICD-10-CM | POA: Diagnosis not present

## 2020-07-15 DIAGNOSIS — I130 Hypertensive heart and chronic kidney disease with heart failure and stage 1 through stage 4 chronic kidney disease, or unspecified chronic kidney disease: Secondary | ICD-10-CM | POA: Diagnosis not present

## 2020-07-15 DIAGNOSIS — N1831 Chronic kidney disease, stage 3a: Secondary | ICD-10-CM | POA: Diagnosis not present

## 2020-07-15 DIAGNOSIS — I5032 Chronic diastolic (congestive) heart failure: Secondary | ICD-10-CM | POA: Diagnosis not present

## 2020-07-15 DIAGNOSIS — D631 Anemia in chronic kidney disease: Secondary | ICD-10-CM | POA: Diagnosis not present

## 2020-07-15 DIAGNOSIS — J449 Chronic obstructive pulmonary disease, unspecified: Secondary | ICD-10-CM | POA: Diagnosis not present

## 2020-07-16 ENCOUNTER — Other Ambulatory Visit: Payer: Self-pay

## 2020-07-16 ENCOUNTER — Ambulatory Visit (INDEPENDENT_AMBULATORY_CARE_PROVIDER_SITE_OTHER): Payer: Medicare HMO | Admitting: Physician Assistant

## 2020-07-16 VITALS — BP 129/68 | HR 68 | Temp 98.6°F | Resp 20 | Ht 67.0 in | Wt 188.0 lb

## 2020-07-16 DIAGNOSIS — Z89431 Acquired absence of right foot: Secondary | ICD-10-CM | POA: Diagnosis not present

## 2020-07-16 DIAGNOSIS — I739 Peripheral vascular disease, unspecified: Secondary | ICD-10-CM | POA: Diagnosis not present

## 2020-07-16 NOTE — Progress Notes (Signed)
Office Note     CC:  follow up Requesting Provider:  Monico Blitz, MD  HPI: Mike Wright is a 87 y.o. (1933-08-06) male who presents for incision check of right TMA.  This was performed by Dr. Trula Slade in November 2021 and has been slow to heal.  He had a diagnostic arteriogram prior to surgery which demonstrated tibial vessel disease but no indication for revascularization at that time.  He continues to wear his Darco shoe.  He receives home health wound care once a week and stated that he had bleeding from lateral portion of the incision that is slow to heal after the nurse trimmed some dead skin.  His wife is also present who states the incision continues to look better and better.  Patient also requests evaluation of his fingertips of his right first and second fingertip which are "black."  He states this started as blistering about 4 weeks ago.  They are not painful and he believes they are healing well.  He has a history of atrial fibrillation.  He is on Eliquis and Plavix.   Past Medical History:  Diagnosis Date  . Anemia    a. mild, noted 04/2017.  [name] CAD in native artery    a. [name] 04/2017 s/p DES to D1, DES to prox-mid LAD, DES to prox LAD overlapping the prior stent, LVEF 55-65%.   . Chronic diastolic CHF (congestive heart failure) ([place])   . Constipation   . COPD (chronic obstructive pulmonary disease) ([place])   . Diabetic ulcer of toe ([place])   . DJD (degenerative joint disease) of cervical spine   . Dysrhythmia    AFib  . Essential hypertension   . GERD (gastroesophageal reflux disease)   . History of hiatal hernia   . HIT (heparin-induced thrombocytopenia) ([place])   . Hypothyroidism   . Hypoxia    a. went home on home O2 04/2017.  [name] Insomnia   . Mixed hyperlipidemia   . PAD (peripheral artery disease) ([place])   . PAF (paroxysmal atrial fibrillation) ([place])   . PVD (peripheral vascular disease) ([place])   . Renal insufficiency   . 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  .  Retinitis   . Sinus bradycardia    a. HR 30s-40s in 04/2017 -> diltiazem stopped, metoprolol reduced.  . Sleep apnea    "chose not to order CPAP at this time" (05/18/2017)  . Type 2 diabetes mellitus ([place])   . Wears glasses   . Wheezing    a. suspected COPD 04/2017. Former tobacco x 40 years.    Past Surgical History:  Procedure Laterality Date  . ABDOMINAL AORTOGRAM W/LOWER EXTREMITY N/A 09/15/2017   Procedure: ABDOMINAL AORTOGRAM W/LOWER EXTREMITY;  Surgeon: Serafina Mitchell, MD;  Location: [facility];  Service: Cardiovascular;  Laterality: N/A;  . ABDOMINAL AORTOGRAM W/LOWER EXTREMITY N/A 02/04/2020   Procedure: ABDOMINAL AORTOGRAM W/LOWER EXTREMITY;  Surgeon: Serafina Mitchell, MD;  Location: [facility];  Service: Cardiovascular;  Laterality: N/A;  . AMPUTATION Bilateral 09/20/2017   Procedure: BILATERAL GREAT TOE AMPUTATIONS INCLUDING METATARSAL HEADS;  Surgeon: Serafina Mitchell, MD;  [facility];  Service: Vascular;  Laterality: Bilateral;  . AMPUTATION Right 03/01/2019   Procedure: AMPUTATION TOES;  Surgeon: Angelia Mould, MD;  Location: [facility];  Service: Vascular;  Laterality: Right;  . AMPUTATION Right 05/29/2019   Procedure: AMPUTATION RIGHT THIRD TOE;  Surgeon: Serafina Mitchell, MD;  Location: [facility];  Service: Vascular;  Laterality: Right;  . AMPUTATION Right 09/26/2019   Procedure: AMPUTATION RIGHT FOURTH TOE AND FIFTH TOES;  Surgeon: Serafina Mitchell, MD;  [facility];  Service: Vascular;  Laterality: Right;  . ANGIOPLASTY Right 09/20/2017   Procedure: ANGIOPLASTY RIGHT TIBIAL ARTERY;  Surgeon: Serafina Mitchell, MD;  Location: [facility];  Service: Vascular;  Laterality: Right;  . APPENDECTOMY    . BACK SURGERY    . CARDIAC CATHETERIZATION  1980s; 2012;  . CATARACT EXTRACTION Left 2004  . COLONOSCOPY  2004   FLEISHMAN TICS  . COLONOSCOPY WITH PROPOFOL N/A 11/28/2018   Procedure: COLONOSCOPY WITH PROPOFOL;  Surgeon: Rogene Houston, MD;  [facility];  Service: Endoscopy;  Laterality: N/A;  . CORONARY ANGIOPLASTY WITH STENT PLACEMENT  05/18/2017   "3 stents"  . CORONARY STENT INTERVENTION N/A 05/18/2017   Procedure: CORONARY STENT INTERVENTION;  Surgeon: Jettie Booze, MD;  Location: [place];  Service: Cardiovascular;  Laterality: N/A;  . ESOPHAGOGASTRODUODENOSCOPY (EGD) WITH PROPOFOL N/A 11/20/2017   Procedure: ESOPHAGOGASTRODUODENOSCOPY (EGD) WITH PROPOFOL;  Surgeon: Irene Shipper, MD;  Location: [place];  Service: Gastroenterology;  Laterality: N/A;  . ESOPHAGOGASTRODUODENOSCOPY (EGD) WITH PROPOFOL N/A 11/28/2018   Procedure: ESOPHAGOGASTRODUODENOSCOPY (EGD) WITH PROPOFOL;  Surgeon: Rogene Houston, MD;  [facility];  Service: Endoscopy;  Laterality: N/A;  . ESOPHAGOGASTRODUODENOSCOPY (EGD) WITH PROPOFOL N/A 07/12/2019   Procedure: ESOPHAGOGASTRODUODENOSCOPY (EGD) WITH PROPOFOL;  Surgeon: Rogene Houston, MD;  [facility];  Service: Endoscopy;  Laterality: N/A;  125  . GIVENS CAPSULE STUDY N/A 12/17/2018   Procedure: GIVENS CAPSULE STUDY;  Surgeon: Rogene Houston, MD;  [facility];  Service: Endoscopy;  Laterality: N/A;  7:30am  . I & D EXTREMITY Right 12/17/2017   Procedure: IRRIGATION AND DEBRIDEMENT RIGHT GREAT TOE;  Surgeon: Serafina Mitchell, MD;  [facility];  Service: Vascular;  Laterality: Right;  . JOINT REPLACEMENT    . LEFT HEART CATH AND CORONARY ANGIOGRAPHY N/A 05/18/2017   Procedure: LEFT HEART CATH AND CORONARY ANGIOGRAPHY;  Surgeon: Jettie Booze, MD;  Location: [facility];  Service: Cardiovascular;  Laterality: N/A;  . LOWER EXTREMITY ANGIOGRAM Right 09/20/2017   Procedure: RIGHT LOWER LEG  ANGIOGRAM;  Surgeon: Serafina Mitchell, MD;  Location: [place];  Service: Vascular;  Laterality: Right;  . LUMBAR FUSION  2002   L3, 4 L4, 5 L5 S1 Fused by Dr. Glenna Fellows  . PERIPHERAL VASCULAR BALLOON ANGIOPLASTY Left 09/15/2017   Procedure: PERIPHERAL VASCULAR  BALLOON ANGIOPLASTY;  Surgeon: Serafina Mitchell, MD;  Location: [facility];  Service: Cardiovascular;  Laterality: Left;  PTA of Peroneal & Posterior Tibial  . POLYPECTOMY  11/28/2018   Procedure: POLYPECTOMY;  Surgeon: Rogene Houston, MD;  [facility];  Service: Endoscopy;;  duodenum  . POSTERIOR LUMBAR FUSION    . RHINOPLASTY    . RIGHT HEART CATH N/A 05/18/2017   Procedure: RIGHT HEART CATH;  Surgeon: Jettie Booze, MD;  Location: [facility];  Service: Cardiovascular;  Laterality: N/A;  . TEE WITHOUT CARDIOVERSION N/A 09/18/2017   Procedure: TRANSESOPHAGEAL ECHOCARDIOGRAM (TEE);  Surgeon: Fay Records, MD;  Location: [facility];  Service: Cardiovascular;  Laterality: N/A;  . TOTAL KNEE ARTHROPLASTY Bilateral   . TRANSMETATARSAL AMPUTATION Right 02/04/2020   Procedure: RIGHT TRANSMETATARSAL AMPUTATION;  Surgeon: Serafina Mitchell, MD;  Location: [facility];  Service: Vascular;  Laterality: Right;  . TRANSURETHRAL  RESECTION OF PROSTATE  2001   [place]    Social History   Socioeconomic History  . Marital status: Married    Spouse name: Not on file  . Number of children: Not on file  . Years of education: Not on file  . Highest education level: Not on file  Occupational History  . Not on file  Tobacco Use  . Smoking status: Former Smoker    Packs/day: 2.00    Years: 30.00    Pack years: 60.00    Types: Cigarettes    Start date: 03/28/1953    Quit date: 03/29/1983    Years since quitting: 37.3  . Smokeless tobacco: Never Used  Vaping Use  . Vaping Use: Never used  Substance and Sexual Activity  . Alcohol use: Not Currently    Comment: 05/18/2017 'I drink a beer q yr"  . Drug use: No  . Sexual activity: Not Currently  Other Topics Concern  . Not on file  Social History Narrative   Patient lives at home with his spouse.    Social Determinants of Health   Financial Resource Strain: Not on file  Food Insecurity: Not on file  Transportation Needs:  No Transportation Needs  . Lack of Transportation (Medical): No  . Lack of Transportation (Non-Medical): No  Physical Activity: Not on file  Stress: No Stress Concern Present  . Feeling of Stress : Not at all  Social Connections: Not on file  Intimate Partner Violence: Not on file    Family History  Problem Relation Age of Onset  . Heart attack Father 59  . COPD Father   . COPD Mother   . Heart disease Mother   . Diabetes Mother   . Hypertension Sister   . CVA Sister   . Diabetes Sister   . Multiple sclerosis Sister     Current Outpatient Medications  Medication Sig Dispense Refill  . Accu-Chek Softclix Lancets lancets     . acetaminophen (TYLENOL) 500 MG tablet Take 1 tablet (500 mg total) by mouth every 6 (six) hours as needed for mild pain or headache. 30 tablet 0  . Alcohol Swabs (B-D SINGLE USE SWABS REGULAR) PADS     . atorvastatin (LIPITOR) 80 MG tablet Take 80 mg by mouth every evening.     . Blood Glucose Monitoring Suppl (ACCU-CHEK GUIDE) w/Device KIT     . Cholecalciferol (VITAMIN D3) 50 MCG (2000 UT) TABS Take 2,000 Units by mouth daily.    . clopidogrel (PLAVIX) 75 MG tablet Take 75 mg by mouth daily.    . Cyanocobalamin (B-12) 2500 MCG TABS Take 2,500 mcg by mouth daily.    . DROPLET INSULIN SYRINGE 31G X 5/16" 1 ML MISC     . ELIQUIS 5 MG TABS tablet TAKE 1 TABLET TWICE DAILY 180 tablet 3  . finasteride (PROSCAR) 5 MG tablet Take 5 mg by mouth daily.    . folic acid (FOLVITE) 1 MG tablet Take 1 tablet (1 mg total) by mouth daily. 30 tablet 1  . furosemide (LASIX) 80 MG tablet Take 80 mg by mouth See admin instructions. Take 80 mg by mouth in the morning and between 5-6 PM daily    . insulin glargine (LANTUS) 100 UNIT/ML injection Inject 30-40 Units into the skin See admin instructions. Inject 30 units into the skin in the morning and 40 units at bedtime    . ipratropium-albuterol (DUONEB) 0.5-2.5 (3) MG/3ML SOLN Take 3 mLs by nebulization every evening.     [name]  linezolid (ZYVOX) 600 MG tablet TAKE 1 TABLET (600 MG TOTAL) BY MOUTH EVERY TWELVE HOURS FOR 10 DAYS. 20 tablet 0  . lisinopril (ZESTRIL) 5 MG tablet Take 0.5 tablets (2.5 mg total) by mouth daily.    . meclizine (ANTIVERT) 25 MG tablet Take 25 mg by mouth daily as needed for dizziness.    . metFORMIN (GLUCOPHAGE) 1000 MG tablet Take 1,000 mg by mouth 2 (two) times daily.    . nitroGLYCERIN (NITROSTAT) 0.4 MG SL tablet PLACE 1 TABLET (0.4 MG TOTAL) UNDER THE TONGUE EVERY 5  MINUTES AS NEEDED FOR CHEST PAIN. 25 tablet 3  . OXYGEN Inhale 2 L/min into the lungs See admin instructions. 2 L/min of oxygen at bedtime and during the day as needed for shortness of breath    . pantoprazole (PROTONIX) 40 MG tablet Take 1 tablet (40 mg total) by mouth 2 (two) times daily. To protect stomach while taking multiple blood thinners. 60 tablet 2  . potassium chloride SA (K-DUR) 20 MEQ tablet Take 1 tablet (20 mEq total) by mouth daily. 30 tablet 1  . psyllium (METAMUCIL) 58.6 % packet Take 0.5 packets by mouth 2 (two) times daily. [place]    . SPIRIVA HANDIHALER 18 MCG inhalation capsule Place 1 capsule into inhaler and inhale daily after breakfast.     . traMADol (ULTRAM) 50 MG tablet Take 1 tablet (50 mg total) by mouth every 8 (eight) hours as needed. 20 tablet 0  . traZODone (DESYREL) 100 MG tablet Take 100-200 mg by mouth at bedtime.     . TRUE METRIX BLOOD GLUCOSE TEST test strip     . isosorbide mononitrate (IMDUR) 30 MG 24 hr tablet Take 1.5 tablets (45 mg total) by mouth in the morning and at bedtime. 270 tablet 3   No current facility-administered medications for this visit.    Allergies  Allergen Reactions  . Bactrim [Sulfamethoxazole-Trimethoprim] Other (See Comments)    Pancytopenia  . Codeine Shortness Of Breath  . Doxycycline Swelling    Swelling and numbness in lips and face. Swelling improved after stopping. Reports still experiences numbness in bottom lip.   Shirlean Kelly [Ferumoxytol]  Other (See Comments)    Diaphoretic, chest pain  . Heparin Other (See Comments)    +HIT,  Severe bleeding (with heparin drip & large doses), tolerates low doses  . Losartan Swelling  . Oxycodone Other (See Comments)    "Made me act out of my mind" Mental status changes- hallucinations  . Latex Rash     REVIEW OF SYSTEMS:   _0  denotes positive finding, _1  denotes negative finding Cardiac  Comments:  Chest pain or chest pressure:    Shortness of breath upon exertion:    Short of breath when lying flat:    Irregular heart rhythm:        Vascular    Pain in calf, thigh, or hip brought on by ambulation:    Pain in feet at night that wakes you up from your sleep:     Blood clot in your veins:    Leg swelling:         Pulmonary    Oxygen at home:    Productive cough:     Wheezing:         Neurologic    Sudden weakness in arms or legs:     Sudden numbness in arms or legs:     Sudden onset of difficulty speaking or slurred speech:  Temporary loss of vision in one eye:     Problems with dizziness:         Gastrointestinal    Blood in stool:     Vomited blood:         Genitourinary    Burning when urinating:     Blood in urine:        Psychiatric    Major depression:         Hematologic    Bleeding problems:    Problems with blood clotting too easily:        Skin    Rashes or ulcers:        Constitutional    Fever or chills:      PHYSICAL EXAMINATION:  Vitals:   07/16/20 1330  BP: 129/68  Pulse: 68  Resp: 20  Temp: 98.6 F (37 C)  TempSrc: Temporal  SpO2: 98%  Weight: 188 lb (85.3 kg)  Height: 5' 7" (1.702 m)    General:  WDWN in NAD; vital signs documented above Gait: Not observed HENT: WNL, normocephalic Pulmonary: normal non-labored breathing Cardiac: regular HR Abdomen: soft, NT, no masses Skin: without rashes Vascular Exam/Pulses:  Right Left  Radial 2+ (normal) 2+ (normal)  DP Brisk by doppler Did not examine  PT Brisk by doppler  Did not examine   Extremities: Medial and lateral portion of TMA incision continue to heal; no surrounding erythema or sign of infection; TMA as pictured below; well demarcated dry gangrenous fingertips of left first and second finger with no sign of infection Musculoskeletal: no muscle wasting or atrophy  Neurologic: A&O X 3;  No focal weakness or paresthesias are detected Psychiatric:  The pt has Normal affect       ASSESSMENT/PLAN:: 87 y.o. 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 Scot Dock.  Continue to cleanse fingertips with soap and water daily.  Patient is following up in 4 to 6 weeks to reevaluate TMA incision.  If fingertips worsen or if patient has further symptoms that would suggest an embolic event, we will consider ordering an echocardiogram and CTA chest at that time   Dagoberto Ligas, PA-C [facility] MD:   Scot Dock

## 2020-07-22 DIAGNOSIS — E1122 Type 2 diabetes mellitus with diabetic chronic kidney disease: Secondary | ICD-10-CM | POA: Diagnosis not present

## 2020-07-22 DIAGNOSIS — I509 Heart failure, unspecified: Secondary | ICD-10-CM | POA: Diagnosis not present

## 2020-07-22 DIAGNOSIS — N1831 Chronic kidney disease, stage 3a: Secondary | ICD-10-CM | POA: Diagnosis not present

## 2020-07-22 DIAGNOSIS — E1169 Type 2 diabetes mellitus with other specified complication: Secondary | ICD-10-CM | POA: Diagnosis not present

## 2020-07-22 DIAGNOSIS — I130 Hypertensive heart and chronic kidney disease with heart failure and stage 1 through stage 4 chronic kidney disease, or unspecified chronic kidney disease: Secondary | ICD-10-CM | POA: Diagnosis not present

## 2020-07-22 DIAGNOSIS — T8743 Infection of amputation stump, right lower extremity: Secondary | ICD-10-CM | POA: Diagnosis not present

## 2020-07-22 DIAGNOSIS — D631 Anemia in chronic kidney disease: Secondary | ICD-10-CM | POA: Diagnosis not present

## 2020-07-22 DIAGNOSIS — I5032 Chronic diastolic (congestive) heart failure: Secondary | ICD-10-CM | POA: Diagnosis not present

## 2020-07-22 DIAGNOSIS — J449 Chronic obstructive pulmonary disease, unspecified: Secondary | ICD-10-CM | POA: Diagnosis not present

## 2020-07-22 DIAGNOSIS — M869 Osteomyelitis, unspecified: Secondary | ICD-10-CM | POA: Diagnosis not present

## 2020-07-29 DIAGNOSIS — N1831 Chronic kidney disease, stage 3a: Secondary | ICD-10-CM | POA: Diagnosis not present

## 2020-07-29 DIAGNOSIS — M869 Osteomyelitis, unspecified: Secondary | ICD-10-CM | POA: Diagnosis not present

## 2020-07-29 DIAGNOSIS — E1122 Type 2 diabetes mellitus with diabetic chronic kidney disease: Secondary | ICD-10-CM | POA: Diagnosis not present

## 2020-07-29 DIAGNOSIS — E1169 Type 2 diabetes mellitus with other specified complication: Secondary | ICD-10-CM | POA: Diagnosis not present

## 2020-07-29 DIAGNOSIS — I5032 Chronic diastolic (congestive) heart failure: Secondary | ICD-10-CM | POA: Diagnosis not present

## 2020-07-29 DIAGNOSIS — I130 Hypertensive heart and chronic kidney disease with heart failure and stage 1 through stage 4 chronic kidney disease, or unspecified chronic kidney disease: Secondary | ICD-10-CM | POA: Diagnosis not present

## 2020-07-29 DIAGNOSIS — D631 Anemia in chronic kidney disease: Secondary | ICD-10-CM | POA: Diagnosis not present

## 2020-07-29 DIAGNOSIS — T8743 Infection of amputation stump, right lower extremity: Secondary | ICD-10-CM | POA: Diagnosis not present

## 2020-07-29 DIAGNOSIS — J449 Chronic obstructive pulmonary disease, unspecified: Secondary | ICD-10-CM | POA: Diagnosis not present

## 2020-07-31 IMAGING — DX DG CHEST 2V
2 series · 2 of 2 positions shown · non-contrast
Comparison: 09/04/2017.

CLINICAL DATA: Shortness of breath.  Chest pains.  Cough.

EXAM:
CHEST - 2 VIEW

[chest pa]
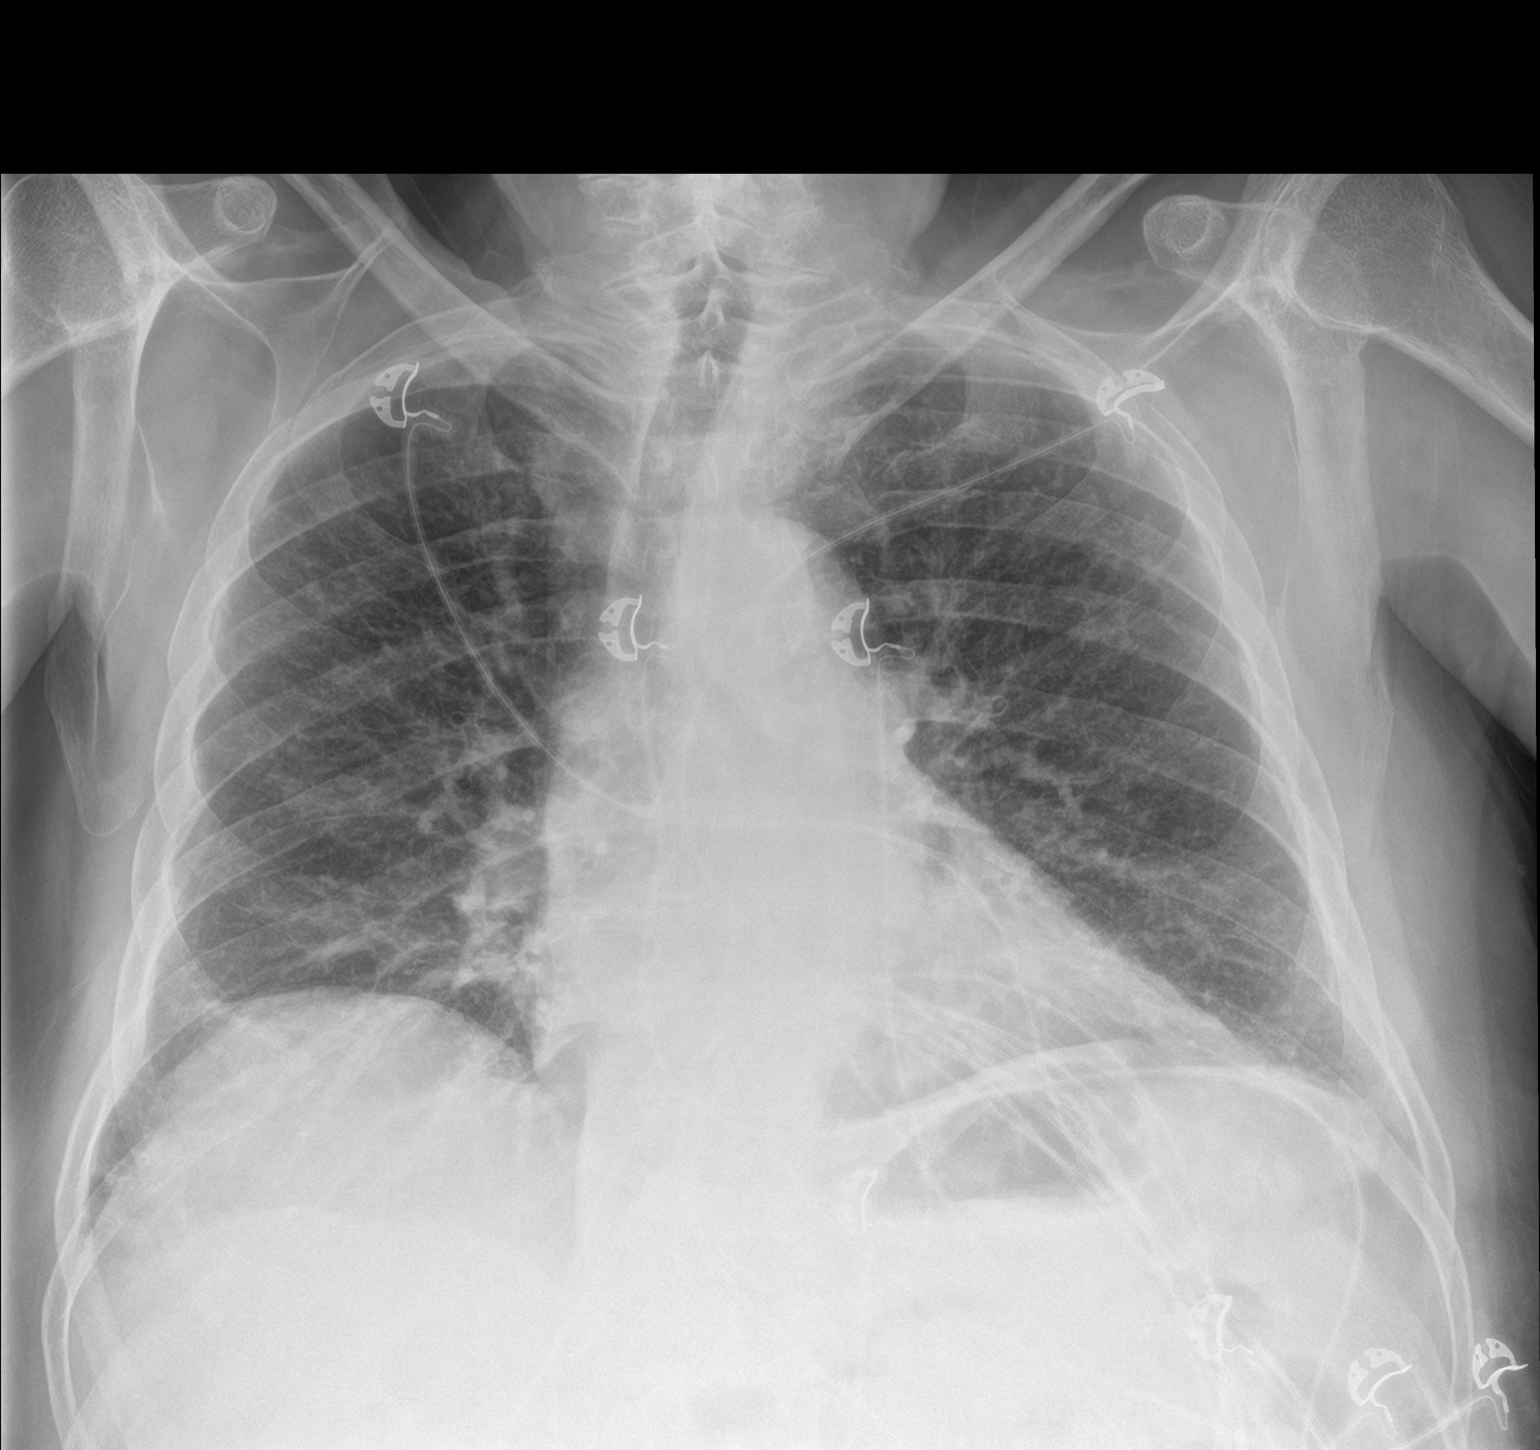

[chest lat]
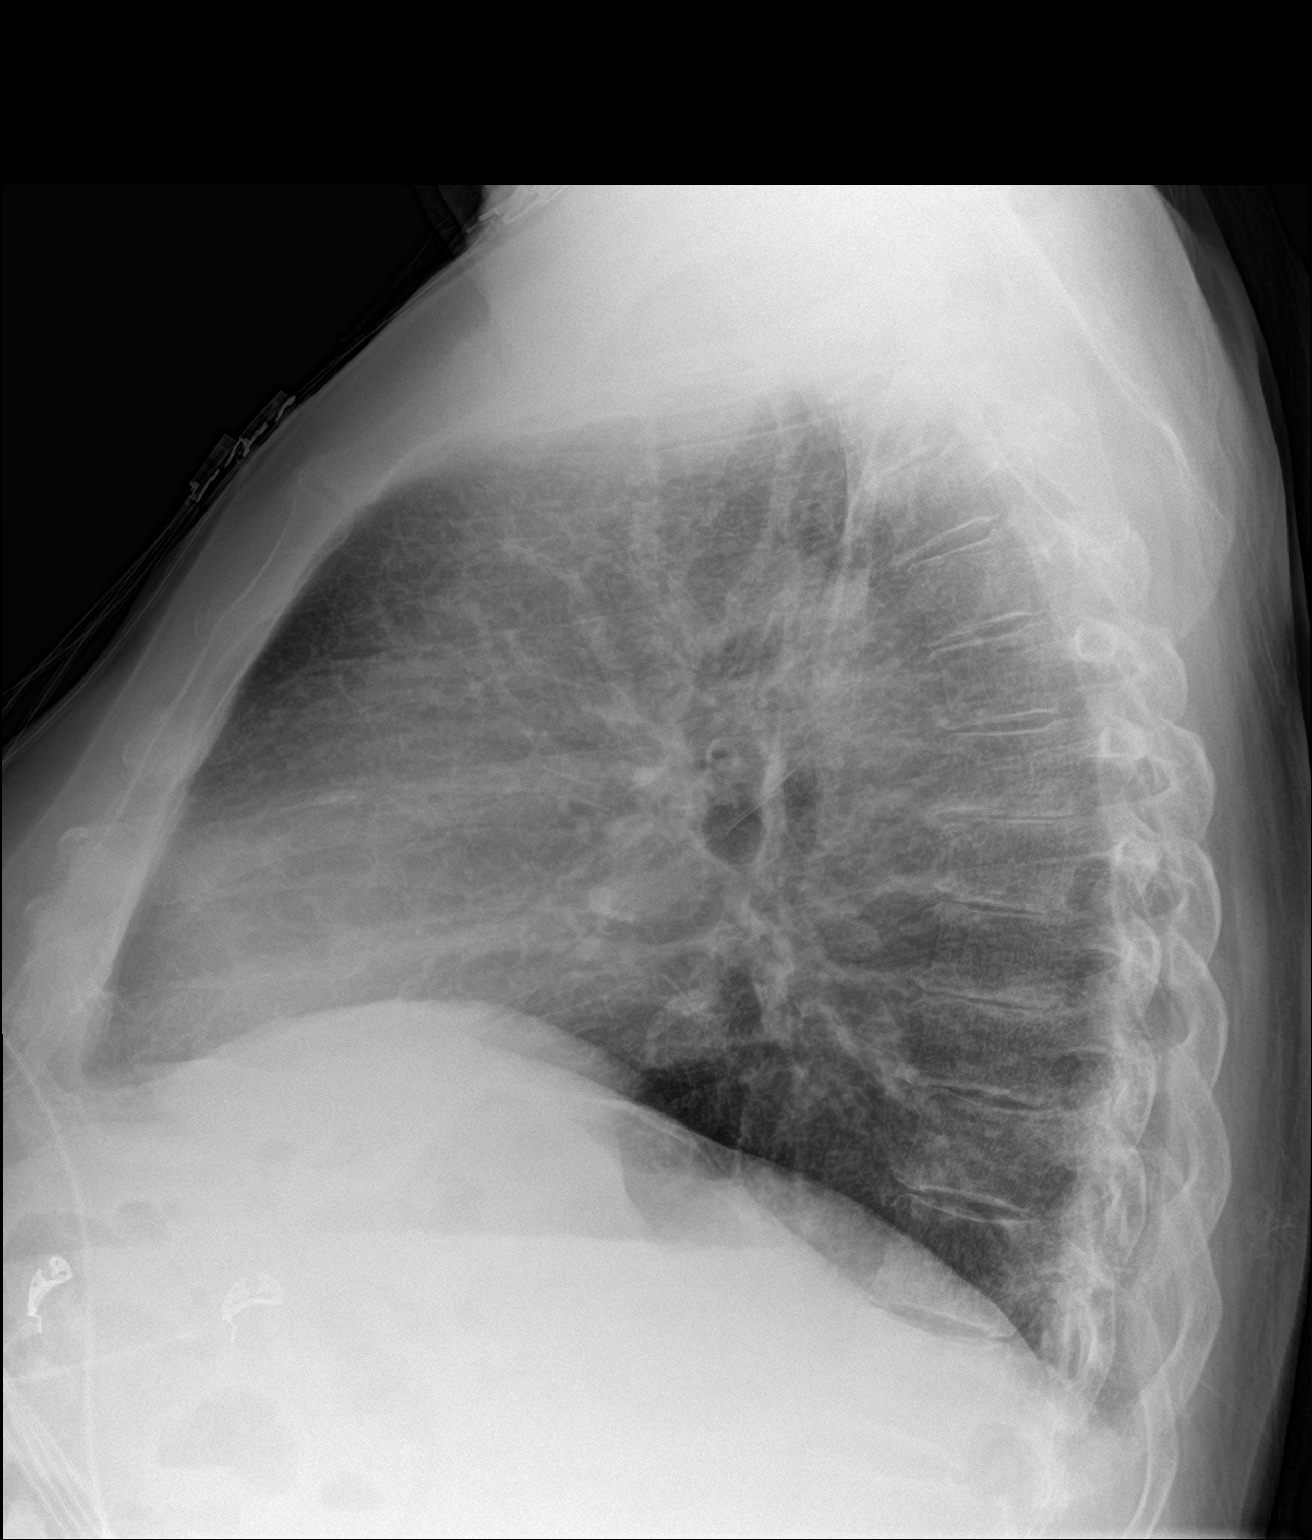

[2 of 2 positions shown; findings below may reference images not displayed]

FINDINGS: Mediastinum and hilar structures normal. Mild bibasilar
atelectasis/infiltrates. No pleural effusion or pneumothorax. Stable
mild cardiomegaly. No acute bony abnormality.
IMPRESSION: Mild bibasilar atelectasis/infiltrates.

## 2020-08-03 DIAGNOSIS — Z89431 Acquired absence of right foot: Secondary | ICD-10-CM | POA: Diagnosis not present

## 2020-08-03 DIAGNOSIS — D649 Anemia, unspecified: Secondary | ICD-10-CM | POA: Diagnosis not present

## 2020-08-04 IMAGING — CT CT CTA ABD/PEL W/CM AND/OR W/O CM
3 of 12 series · 11 of 46 positions shown, 16 images · IV contrast (APPLIED)
Comparison: CT chest 03/24/2010

CLINICAL DATA: 84-year-old male with a history of known vascular
disease and postprandial pain

EXAM:
CTA ABDOMEN AND PELVIS wITHOUT AND WITH CONTRAST
TECHNIQUE: Multidetector CT imaging of the abdomen and pelvis was performed
using the standard protocol during bolus administration of
intravenous contrast. Multiplanar reconstructed images and MIPs were
obtained and reviewed to evaluate the vascular anatomy.
CONTRAST:  100mL 1CYJZB-PF0 IOPAMIDOL (1CYJZB-PF0) INJECTION 76%

[Series 12: venous · axial · portal-venous · 0.83mm/px · z∈[+800,+1350]mm · 3 of 276 slices shown, 7 images]
[im 1/276  soft-tissue]
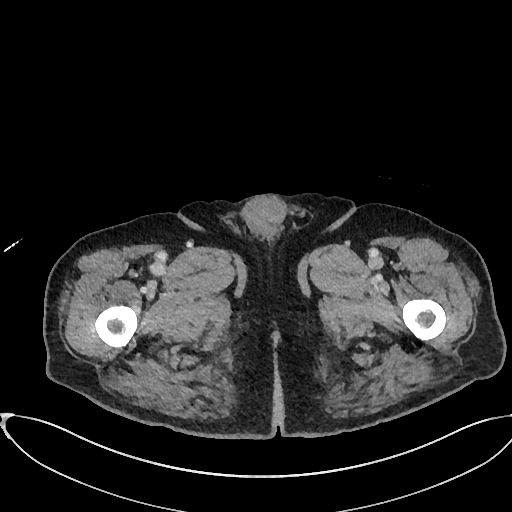
[im 1/276  lung]
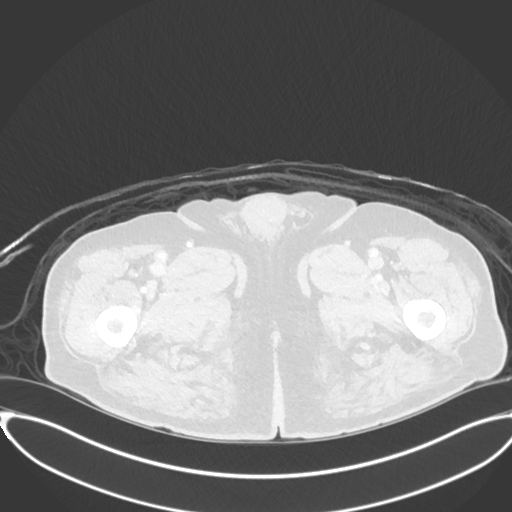
[im 1/276  bone]
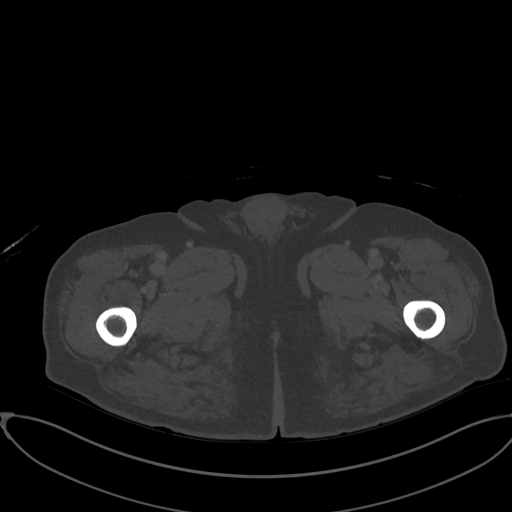
[im 138/276  soft-tissue]
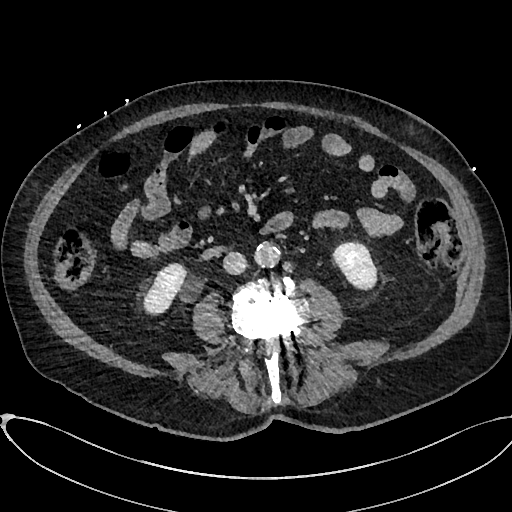
[im 138/276  lung]
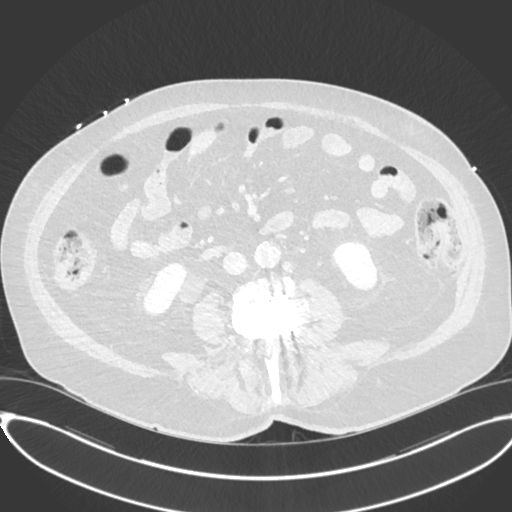
[im 276/276  soft-tissue]
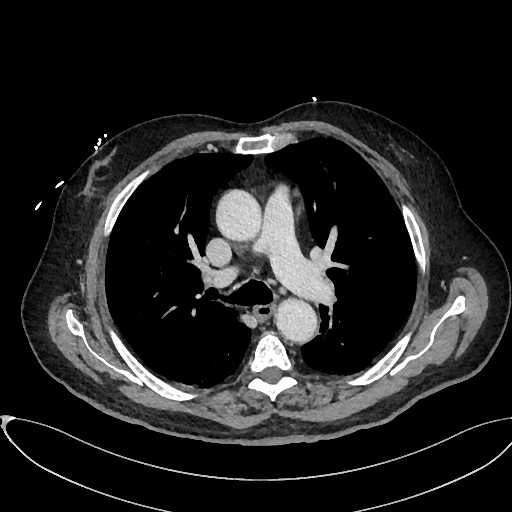
[im 276/276  lung]
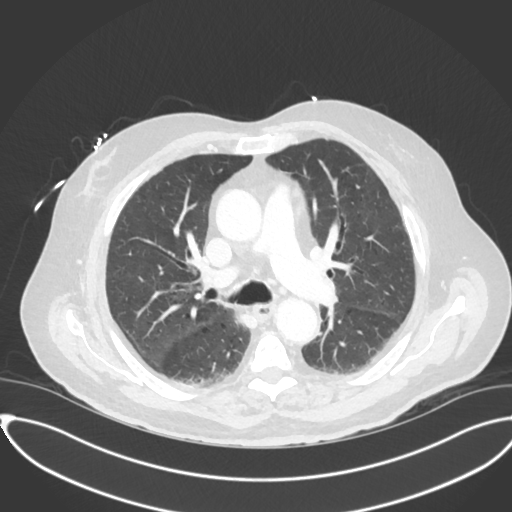

[Series 13: venous thins · axial · portal-venous · 0.83mm/px · z∈[+844,+1166]mm · 6 of 1379 slices shown]
[im 115/1379  soft-tissue]
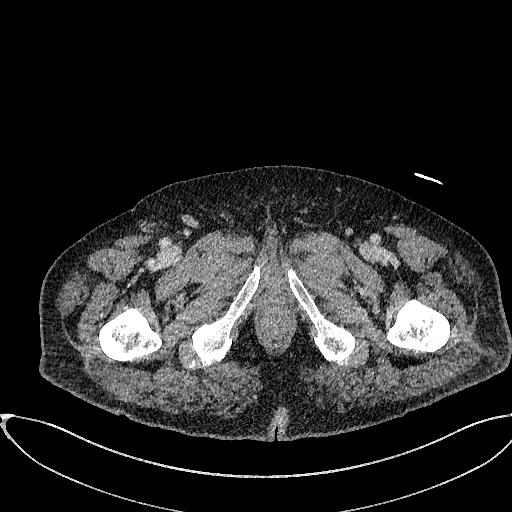
[im 345/1379  soft-tissue]
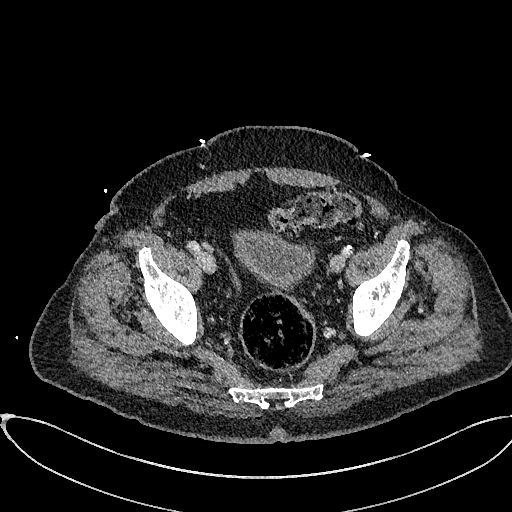
[im 460/1379  soft-tissue]
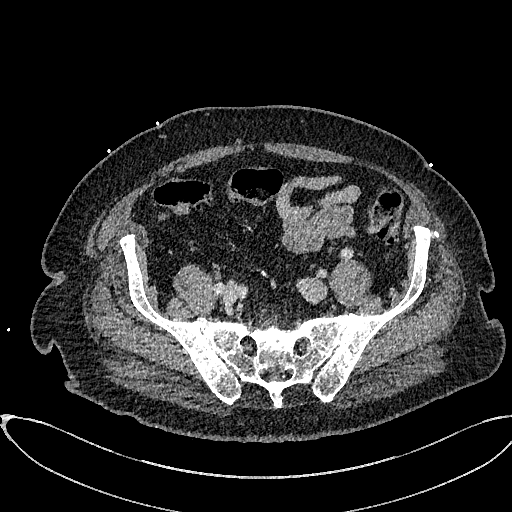
[im 575/1379  soft-tissue]
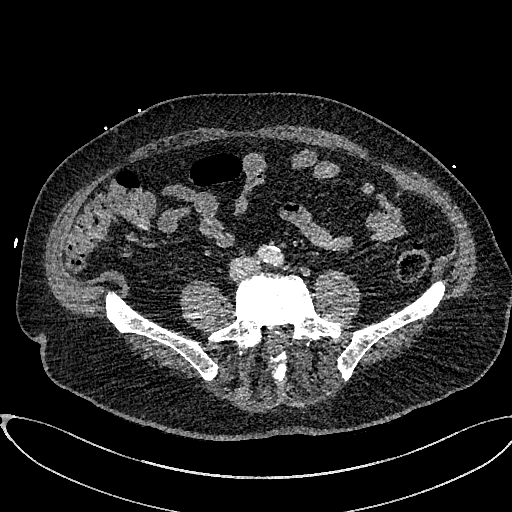
[im 804/1379  soft-tissue]
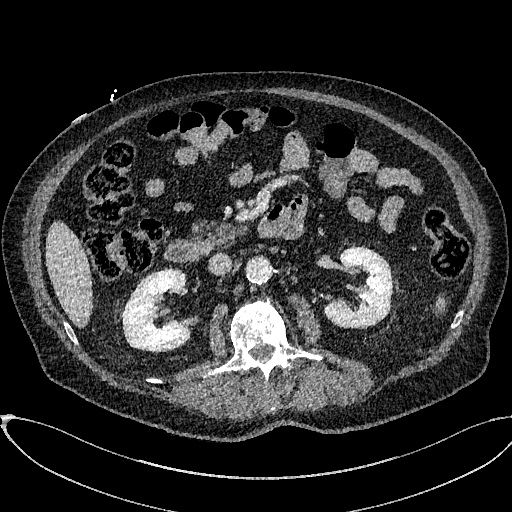
[im 919/1379  soft-tissue]
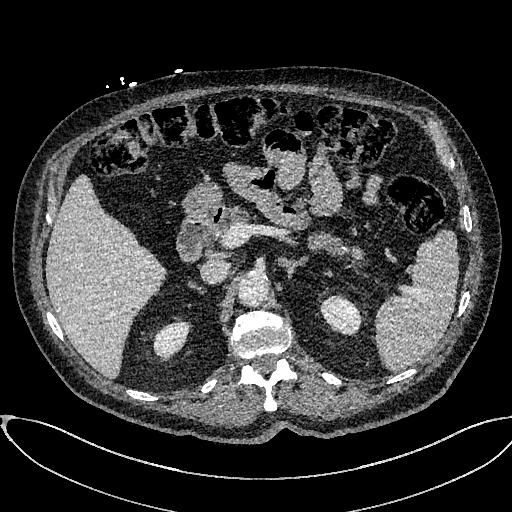

[Series 15: venous cor · coronal · portal-venous · 0.89mm/px · 2 of 171 slices shown, 3 images]
[im 57/171  soft-tissue]
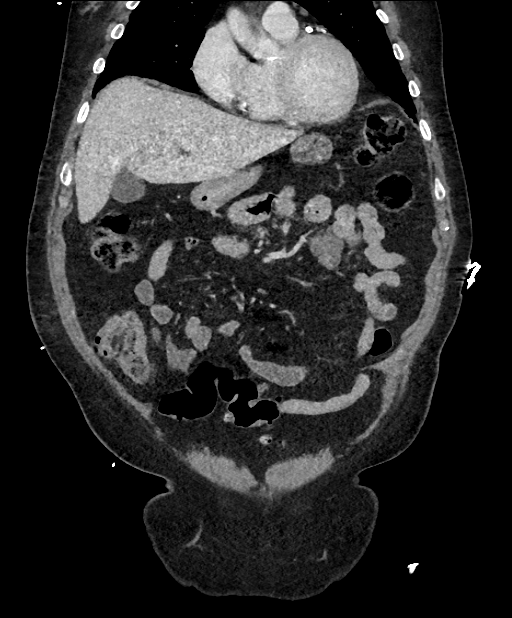
[im 57/171  bone]
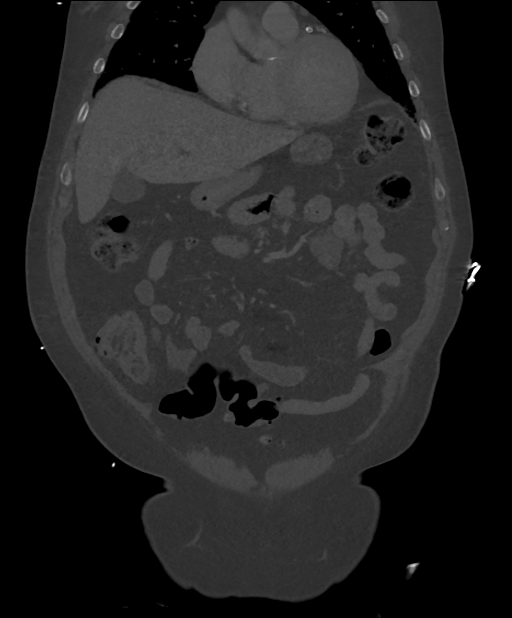
[im 114/171  soft-tissue]
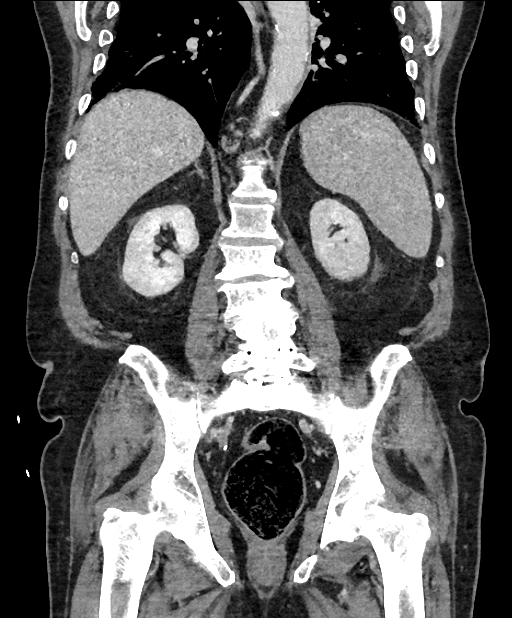

[11 of 46 positions shown; findings below may reference images not displayed]

FINDINGS: VASCULAR

Aorta: Mild atherosclerotic changes of the abdominal aorta. No
aneurysm or dissection flap. No periaortic fluid.

Celiac: Celiac artery patent with no significant stenosis. Mild
atherosclerotic changes. Branches are patent.

SMA: Superior mesenteric artery is patent with no significant
atherosclerotic changes at the origin. Branches are patent.

Renals: Bilateral renal arteries with mild atherosclerotic changes
and no significant stenosis. Single renal arteries bilaterally.

IMA: Inferior mesenteric artery is patent.

Right lower extremity:

Mild atherosclerotic changes of the right iliac system with mild
tortuosity. Hypogastric artery is patent. Common femoral arteries
patent with mild atherosclerotic changes. Proximal SFA and profunda
femoris patent.

Left lower extremity:

Mild atherosclerotic changes of the left iliac system with mild
tortuosity. Hypogastric artery is patent. External iliac artery
patent without significant stenosis. Common femoral artery patent
with mild atherosclerotic changes. Proximal profunda femoris and SFA
patent.

Veins: Unremarkable appearance of the veins.

Review of the MIP images confirms the above findings.

NON-VASCULAR

Lower chest: Scarring of the lower lungs with bronchiectasis. No
acute finding of the lower chest. Native coronary calcifications.

Hepatobiliary: Unremarkable liver . minimal hyperdense material
within the gallbladder lumen compatible with cholelithiasis. No
pericholecystic fluid.

Pancreas: Punctate calcifications throughout the pancreas. Uniform
density/enhancement of the pancreatic parenchyma. No peripancreatic
inflammatory changes.

Spleen: Unremarkable spleen

Adrenals/Urinary Tract: Unremarkable appearance of adrenal glands.

Right:

No hydronephrosis. Symmetric perfusion to the left. No
nephrolithiasis. Unremarkable course of the right ureter. Minimal
perinephric stranding.

Left:

No hydronephrosis. There is decreased perfusion at the superior and
posterior left renal cortex, compared to the remainder of the renal
cortex. No nephrolithiasis. Unremarkable course of the left ureter.
Minimal perinephric stranding.

Circumferential urinary bladder wall thickening with partial
distention of urinary bladder.

Stomach/Bowel: Unremarkable appearance of the stomach. Unremarkable
appearance of small bowel. No evidence of obstruction. Colonic
diverticula without associated inflammatory changes. Mild to
moderate stool burden including formed stool within the rectum. No
evidence of obstruction. Appendix is not visualized, however, no
inflammatory changes are present adjacent to the cecum to indicate
an appendicitis.

Lymphatic: No lymphadenopathy.

Mesenteric: No free fluid or air. No adenopathy.

Reproductive: Transverse diameter of the prostate measures 4.2 cm.

Other: Bilateral fat containing inguinal hernia.

Musculoskeletal: No acute displaced fracture. Surgical changes of
the lower lumbar spine. Grade 1 anterolisthesis of L5 on S1.
Degenerative changes of the hips.
IMPRESSION: CT angiogram of the abdomen does not support chronic mesenteric
ischemia as a source of the patient's postprandial abdominal pain.

Decreased perfusion of the left renal cortex at the superior and
posterior aspect. This may reflect slow flow given the presence
atherosclerosis, though the differential would include both renal
infarction as well as renal infection/pyelonephritis. Correlation
with lab values may be useful. There is no evidence of high-grade
stenosis at the left renal artery origin.

Mild atherosclerotic changes of the abdominal aorta with associated
mild vascular disease of the bilateral iliac arteries.

Incidental imaging of native coronary artery disease.

Circumferential urinary bladder wall thickening, potentially chronic
thickening or related to cystitis.

Moderate to large stool burden, may represent constipation. No
evidence of bowel obstruction.

Diverticular disease without evidence of acute diverticulitis.

## 2020-08-05 DIAGNOSIS — D631 Anemia in chronic kidney disease: Secondary | ICD-10-CM | POA: Diagnosis not present

## 2020-08-05 DIAGNOSIS — E1169 Type 2 diabetes mellitus with other specified complication: Secondary | ICD-10-CM | POA: Diagnosis not present

## 2020-08-05 DIAGNOSIS — I130 Hypertensive heart and chronic kidney disease with heart failure and stage 1 through stage 4 chronic kidney disease, or unspecified chronic kidney disease: Secondary | ICD-10-CM | POA: Diagnosis not present

## 2020-08-05 DIAGNOSIS — J449 Chronic obstructive pulmonary disease, unspecified: Secondary | ICD-10-CM | POA: Diagnosis not present

## 2020-08-05 DIAGNOSIS — I5032 Chronic diastolic (congestive) heart failure: Secondary | ICD-10-CM | POA: Diagnosis not present

## 2020-08-05 DIAGNOSIS — T8743 Infection of amputation stump, right lower extremity: Secondary | ICD-10-CM | POA: Diagnosis not present

## 2020-08-05 DIAGNOSIS — M869 Osteomyelitis, unspecified: Secondary | ICD-10-CM | POA: Diagnosis not present

## 2020-08-05 DIAGNOSIS — E1122 Type 2 diabetes mellitus with diabetic chronic kidney disease: Secondary | ICD-10-CM | POA: Diagnosis not present

## 2020-08-05 DIAGNOSIS — N1831 Chronic kidney disease, stage 3a: Secondary | ICD-10-CM | POA: Diagnosis not present

## 2020-08-12 DIAGNOSIS — D631 Anemia in chronic kidney disease: Secondary | ICD-10-CM | POA: Diagnosis not present

## 2020-08-12 DIAGNOSIS — I480 Paroxysmal atrial fibrillation: Secondary | ICD-10-CM | POA: Diagnosis not present

## 2020-08-12 DIAGNOSIS — J449 Chronic obstructive pulmonary disease, unspecified: Secondary | ICD-10-CM | POA: Diagnosis not present

## 2020-08-12 DIAGNOSIS — E1151 Type 2 diabetes mellitus with diabetic peripheral angiopathy without gangrene: Secondary | ICD-10-CM | POA: Diagnosis not present

## 2020-08-12 DIAGNOSIS — E1122 Type 2 diabetes mellitus with diabetic chronic kidney disease: Secondary | ICD-10-CM | POA: Diagnosis not present

## 2020-08-12 DIAGNOSIS — I130 Hypertensive heart and chronic kidney disease with heart failure and stage 1 through stage 4 chronic kidney disease, or unspecified chronic kidney disease: Secondary | ICD-10-CM | POA: Diagnosis not present

## 2020-08-12 DIAGNOSIS — I5032 Chronic diastolic (congestive) heart failure: Secondary | ICD-10-CM | POA: Diagnosis not present

## 2020-08-12 DIAGNOSIS — N1831 Chronic kidney disease, stage 3a: Secondary | ICD-10-CM | POA: Diagnosis not present

## 2020-08-12 DIAGNOSIS — T8743 Infection of amputation stump, right lower extremity: Secondary | ICD-10-CM | POA: Diagnosis not present

## 2020-08-14 ENCOUNTER — Telehealth: Payer: Self-pay | Admitting: Cardiology

## 2020-08-14 ENCOUNTER — Other Ambulatory Visit: Payer: Self-pay

## 2020-08-14 NOTE — Telephone Encounter (Signed)
  Patient Consent for Virtual Visit         Mike Wright has provided verbal consent on 08/14/2020 for a virtual visit (video or telephone).   CONSENT FOR VIRTUAL VISIT FOR:  Mike Wright  By participating in this virtual visit I agree to the following:  I hereby voluntarily request, consent and authorize [facility] and its employed or contracted physicians, physician assistants, nurse practitioners or other licensed health care professionals (the Practitioner), to provide me with telemedicine health care services (the "Services") as deemed necessary by the treating Practitioner. I acknowledge and consent to receive the Services by the Practitioner via telemedicine. I understand that the telemedicine visit will involve communicating with the Practitioner through live audiovisual communication technology and the disclosure of certain medical information by electronic transmission. I acknowledge that I have been given the opportunity to request an in-person assessment or other available alternative prior to the telemedicine visit and am voluntarily participating in the telemedicine visit.  I understand that I have the right to withhold or withdraw my consent to the use of telemedicine in the course of my care at any time, without affecting my right to future care or treatment, and that the Practitioner or I may terminate the telemedicine visit at any time. I understand that I have the right to inspect all information obtained and/or recorded in the course of the telemedicine visit and may receive copies of available information for a reasonable fee.  I understand that some of the potential risks of receiving the Services via telemedicine include:  [name] Delay or interruption in medical evaluation due to technological equipment failure or disruption; . Information transmitted may not be sufficient (e.g. poor resolution of images) to allow for appropriate medical decision making by the Practitioner;  and/or  . In rare instances, security protocols could fail, causing a breach of personal health information.  Furthermore, I acknowledge that it is my responsibility to provide information about my medical history, conditions and care that is complete and accurate to the best of my ability. I acknowledge that Practitioner's advice, recommendations, and/or decision may be based on factors not within their control, such as incomplete or inaccurate data provided by me or distortions of diagnostic images or specimens that may result from electronic transmissions. I understand that the practice of medicine is not an exact science and that Practitioner makes no warranties or guarantees regarding treatment outcomes. I acknowledge that a copy of this consent can be made available to me via my patient portal ([place] [facility]), or I can request a printed copy by calling the office of [facility].    I understand that my insurance will be billed for this visit.   I have read or had this consent read to me. . I understand the contents of this consent, which adequately explains the benefits and risks of the Services being provided via telemedicine.  . I have been provided ample opportunity to ask questions regarding this consent and the Services and have had my questions answered to my satisfaction. . I give my informed consent for the services to be provided through the use of telemedicine in my medical care

## 2020-08-14 NOTE — Patient Outreach (Signed)
[facility]) Care Management  08/14/2020  RIELLY CORLETT [birth year] [id number]   Telephone call to patient for disease management follow up. No answer.  Unable to leave a message.  Plan: RN CM will attempt again in the month of June.   [name], RN, MSN [facility] Management Care Management Coordinator Direct Line [phone] Cell [phone] Toll Free: [phone]  Fax: [phone]

## 2020-08-17 ENCOUNTER — Encounter: Payer: Self-pay | Admitting: Cardiology

## 2020-08-17 ENCOUNTER — Telehealth (INDEPENDENT_AMBULATORY_CARE_PROVIDER_SITE_OTHER): Payer: Medicare HMO | Admitting: Cardiology

## 2020-08-17 VITALS — BP 140/60 | HR 59 | Ht 67.0 in | Wt 183.0 lb

## 2020-08-17 DIAGNOSIS — I25118 Atherosclerotic heart disease of native coronary artery with other forms of angina pectoris: Secondary | ICD-10-CM | POA: Diagnosis not present

## 2020-08-17 DIAGNOSIS — I480 Paroxysmal atrial fibrillation: Secondary | ICD-10-CM

## 2020-08-17 DIAGNOSIS — I5032 Chronic diastolic (congestive) heart failure: Secondary | ICD-10-CM | POA: Diagnosis not present

## 2020-08-17 NOTE — Patient Instructions (Addendum)
Medication Instructions:   Stop Plavix (Clopidogrel).    Continue all other medications.    Labwork: none  Testing/Procedures: none  Follow-Up: 3 months   Any Other Special Instructions Will Be Listed Below (If Applicable).  If you need a refill on your cardiac medications before your next appointment, please call your pharmacy.

## 2020-08-17 NOTE — Progress Notes (Signed)
Virtual Visit via Telephone Note   This visit type was conducted due to national recommendations for restrictions regarding the COVID-19 Pandemic (e.g. social distancing) in an effort to limit this patient's exposure and mitigate transmission in our community.  Due to his co-morbid illnesses, this patient is at least at moderate risk for complications without adequate follow up.  This format is felt to be most appropriate for this patient at this time.  The patient did not have access to video technology/had technical difficulties with video requiring transitioning to audio format only (telephone).  All issues noted in this document were discussed and addressed.  No physical exam could be performed with this format.  Please refer to the patient's chart for his  consent to telehealth for [facility].    Date:  08/17/2020   ID:  Mike Wright, DOB Feb 08, 1934, MRN [id number] The patient was identified using 2 identifiers.  Patient Location: Home Provider Location: Home Office   PCP:  Monico Blitz, [place] [facility] Cardiologist:  Carlyle Dolly, MD     Evaluation Performed:  Follow-Up Visit  Chief Complaint:  Follow up  History of Present Illness:    Mike Wright is a 87 y.o. male seen today for focused visit on recent issues with diastolic HF.   1. CAD with stable angina - nonobstructive CAD by cath Jan 2012, LVEF 60-65% by LV gram.  - 06/2015 nuclear stress without clear ischemia - 05/2015 echo LVEF 65-70%, no WMAs, cannot evaluate diastolic function 05/5007 lexiscan without ischemia, low risk study.  -cath 04/2017 as reported below. Received DES to 75% D1, DES to 75% mid to distal LAD, DES to 70% prox LAD RHC with CI 2.67, mean PA 25, no wedge reported by LVEDP 14.  - discharged on triple therapy with ASA, plavix, eliquis with plan for 30 days, then stop ASA.   - 11/22/17 admission with chest pain, anemia. Negative workup for ACS. Symptomsresolved  with blood transfusion - 11/2017 admit with chest pain and dyspnea. ACS work negative, CT PE negative. Diuresed 6L with resolustion of symptoms. CXR with rib fracture thought playing a role in chest pain.  01/2020 nuclear stress: Small, mild intensity, mid to basal inferior/inferoseptal defect that is fixed and consistent with soft tissue attenuation in light of normal wall motion. No significant ischemic territories.   - has had some chronic mild infrequent chest pain, takes NG about once every 2-3 weeks - compliant with meds.  - as been on Eliquis and plavix due to high stent burden    2. Chronic diastolic HF - recent admission 11/2018 with acute on chronic diastolic HF - no recent SOB/DOE. No recent edema. Takes lasix 40mg  bid - home weights down to 184 lbs. Hospital discharge weight 191 lbs  - denies any significant swelling - compliant with diuretic, takes lasix 80mg  bid   3. Afib - admission 11/2018 with issues with afib - from notes had afib with slow rates, toprol was stopped  - denies any palpitations - compliant with meds  4.History ofGI bleeding - GI bleed during 11/2018, stopped eliquis at that time, later restarted - heme +positive stools, negative endoscopy with plans for outpatient pill endoscopy  - no recent issues.    5. PAD - followed by vascular - admission 08/2017 with ischemia great toes. Had intervention on lower extremity vessels at that time.  -ultimately required ampuation bilateral great toes  - admit 11/2017 for poor healing right great toe amputation, critical  limb ischemia -admit 02/2019 with gangrene right second toe with nonhealing diabetic foot wound. S/p amputations - additional toe amputation 05/2019  - had repeat partial amputation 01/2020 - slow healing, some intermittent bleeding still at site   6. PSVT/Palpitations. - denies any significant palpitations.   7. Chronic anemia - has received multiple  transfusions   The patient does not have symptoms concerning for COVID-19 infection (fever, chills, cough, or new shortness of breath).    Past Medical History:  Diagnosis Date  . Anemia    a. mild, noted 04/2017.  [name] CAD in native artery    a. [name] 04/2017 s/p DES to D1, DES to prox-mid LAD, DES to prox LAD overlapping the prior stent, LVEF 55-65%.   . Chronic diastolic CHF (congestive heart failure) ([place])   . Constipation   . COPD (chronic obstructive pulmonary disease) ([place])   . Diabetic ulcer of toe ([place])   . DJD (degenerative joint disease) of cervical spine   . Dysrhythmia    AFib  . Essential hypertension   . GERD (gastroesophageal reflux disease)   . History of hiatal hernia   . HIT (heparin-induced thrombocytopenia) ([place])   . Hypothyroidism   . Hypoxia    a. went home on home O2 04/2017.  [name] Insomnia   . Mixed hyperlipidemia   . PAD (peripheral artery disease) ([place])   . PAF (paroxysmal atrial fibrillation) ([place])   . PVD (peripheral vascular disease) ([place])   . Renal insufficiency   . 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  . Retinitis   . Sinus bradycardia    a. HR 30s-40s in 04/2017 -> diltiazem stopped, metoprolol reduced.  . Sleep apnea    "chose not to order CPAP at this time" (05/18/2017)  . Type 2 diabetes mellitus ([place])   . Wears glasses   . Wheezing    a. suspected COPD 04/2017. Former tobacco x 40 years.   Past Surgical History:  Procedure Laterality Date  . ABDOMINAL AORTOGRAM W/LOWER EXTREMITY N/A 09/15/2017   Procedure: ABDOMINAL AORTOGRAM W/LOWER EXTREMITY;  Surgeon: Serafina Mitchell, MD;  Location: [place] [facility];  Service: Cardiovascular;  Laterality: N/A;  . ABDOMINAL AORTOGRAM W/LOWER EXTREMITY N/A 02/04/2020   Procedure: ABDOMINAL AORTOGRAM W/LOWER EXTREMITY;  Surgeon: Serafina Mitchell, MD;  Location: [place] [facility];  Service: Cardiovascular;  Laterality: N/A;  . AMPUTATION Bilateral 09/20/2017   Procedure: BILATERAL GREAT TOE AMPUTATIONS  INCLUDING METATARSAL HEADS;  Surgeon: Serafina Mitchell, MD;  Location: [facility];  Service: Vascular;  Laterality: Bilateral;  . AMPUTATION Right 03/01/2019   Procedure: AMPUTATION TOES;  Surgeon: Angelia Mould, MD;  Location: [facility];  Service: Vascular;  Laterality: Right;  . AMPUTATION Right 05/29/2019   Procedure: AMPUTATION RIGHT THIRD TOE;  Surgeon: Serafina Mitchell, MD;  Location: [facility];  Service: Vascular;  Laterality: Right;  . AMPUTATION Right 09/26/2019   Procedure: AMPUTATION RIGHT FOURTH TOE AND FIFTH TOES;  Surgeon: Serafina Mitchell, MD;  Location: [facility];  Service: Vascular;  Laterality: Right;  . ANGIOPLASTY Right 09/20/2017   Procedure: ANGIOPLASTY RIGHT TIBIAL ARTERY;  Surgeon: Serafina Mitchell, MD;  Location: [facility];  Service: Vascular;  Laterality: Right;  . APPENDECTOMY    . BACK SURGERY    . CARDIAC CATHETERIZATION  1980s; 2012;  . CATARACT EXTRACTION Left 2004  . COLONOSCOPY  2004   FLEISHMAN TICS  . COLONOSCOPY WITH PROPOFOL N/A 11/28/2018   Procedure: COLONOSCOPY WITH PROPOFOL;  Surgeon:  Rogene Houston, MD;  [facility];  Service: Endoscopy;  Laterality: N/A;  . CORONARY ANGIOPLASTY WITH STENT PLACEMENT  05/18/2017   "3 stents"  . CORONARY STENT INTERVENTION N/A 05/18/2017   Procedure: CORONARY STENT INTERVENTION;  Surgeon: Jettie Booze, MD;  Location: [place];  Service: Cardiovascular;  Laterality: N/A;  . ESOPHAGOGASTRODUODENOSCOPY (EGD) WITH PROPOFOL N/A 11/20/2017   Procedure: ESOPHAGOGASTRODUODENOSCOPY (EGD) WITH PROPOFOL;  Surgeon: Irene Shipper, MD;  Location: [facility];  Service: Gastroenterology;  Laterality: N/A;  . ESOPHAGOGASTRODUODENOSCOPY (EGD) WITH PROPOFOL N/A 11/28/2018   Procedure: ESOPHAGOGASTRODUODENOSCOPY (EGD) WITH PROPOFOL;  Surgeon: Rogene Houston, MD;  [facility];  Service: Endoscopy;  Laterality: N/A;  . ESOPHAGOGASTRODUODENOSCOPY (EGD) WITH PROPOFOL N/A 07/12/2019   Procedure:  ESOPHAGOGASTRODUODENOSCOPY (EGD) WITH PROPOFOL;  Surgeon: Rogene Houston, MD;  [facility];  Service: Endoscopy;  Laterality: N/A;  125  . GIVENS CAPSULE STUDY N/A 12/17/2018   Procedure: GIVENS CAPSULE STUDY;  Surgeon: Rogene Houston, MD;  [facility];  Service: Endoscopy;  Laterality: N/A;  7:30am  . I & D EXTREMITY Right 12/17/2017   Procedure: IRRIGATION AND DEBRIDEMENT RIGHT GREAT TOE;  Surgeon: Serafina Mitchell, MD;  [facility];  Service: Vascular;  Laterality: Right;  . JOINT REPLACEMENT    . LEFT HEART CATH AND CORONARY ANGIOGRAPHY N/A 05/18/2017   Procedure: LEFT HEART CATH AND CORONARY ANGIOGRAPHY;  Surgeon: Jettie Booze, MD;  Location: [facility] [facility];  Service: Cardiovascular;  Laterality: N/A;  . LOWER EXTREMITY ANGIOGRAM Right 09/20/2017   Procedure: RIGHT LOWER LEG  ANGIOGRAM;  Surgeon: Serafina Mitchell, MD;  Location: [facility];  Service: Vascular;  Laterality: Right;  . LUMBAR FUSION  2002   L3, 4 L4, 5 L5 S1 Fused by Dr. Glenna Fellows  . PERIPHERAL VASCULAR BALLOON ANGIOPLASTY Left 09/15/2017   Procedure: PERIPHERAL VASCULAR BALLOON ANGIOPLASTY;  Surgeon: Serafina Mitchell, MD;  Location: [facility];  Service: Cardiovascular;  Laterality: Left;  PTA of Peroneal & Posterior Tibial  . POLYPECTOMY  11/28/2018   Procedure: POLYPECTOMY;  Surgeon: Rogene Houston, MD;  [facility];  Service: Endoscopy;;  duodenum  . POSTERIOR LUMBAR FUSION    . RHINOPLASTY    . RIGHT HEART CATH N/A 05/18/2017   Procedure: RIGHT HEART CATH;  Surgeon: Jettie Booze, MD;  Location: [place] [facility];  Service: Cardiovascular;  Laterality: N/A;  . TEE WITHOUT CARDIOVERSION N/A 09/18/2017   Procedure: TRANSESOPHAGEAL ECHOCARDIOGRAM (TEE);  Surgeon: Fay Records, MD;  Location: [place] [facility];  Service: Cardiovascular;  Laterality: N/A;  . TOTAL KNEE ARTHROPLASTY Bilateral   . TRANSMETATARSAL AMPUTATION Right 02/04/2020   Procedure: RIGHT  TRANSMETATARSAL AMPUTATION;  Surgeon: Serafina Mitchell, MD;  Location: [facility];  Service: Vascular;  Laterality: Right;  . TRANSURETHRAL RESECTION OF PROSTATE  2001   [place]     No outpatient medications have been marked as taking for the 08/17/20 encounter (Appointment) with Arnoldo Lenis, MD.     Allergies:   Bactrim [sulfamethoxazole-trimethoprim], Codeine, Doxycycline, Feraheme [ferumoxytol], Heparin, Losartan, Oxycodone, and Latex   Social History   Tobacco Use  . Smoking status: Former Smoker    Packs/day: 2.00    Years: 30.00    Pack years: 60.00    Types: Cigarettes    Start date: 03/28/1953    Quit date: 03/29/1983    Years since quitting: 37.4  . Smokeless tobacco: Never Used  Vaping Use  . Vaping Use: Never used  Substance Use Topics  . Alcohol use: Not Currently    Comment: 05/18/2017 'I drink a beer q yr"  . Drug use: No     Family Hx: The patient's family history includes COPD in his father and mother; CVA in his sister; Diabetes in his mother and sister; Heart attack (age of onset: 98) in his father; Heart disease in his mother; Hypertension in his sister; Multiple sclerosis in his sister.  ROS:   Please see the history of present illness.     All other systems reviewed and are negative.   Prior CV studies:   The following studies were reviewed today:  05/2015 echo Study Conclusions  - Left ventricle: The cavity size was normal. Wall thickness was increased increased in a pattern of mild to moderate LVH. Systolic function was vigorous. The estimated ejection fraction was in the range of 65% to 70%. Wall motion was normal; there were no regional wall motion abnormalities. The study is not technically sufficient to allow evaluation of LV diastolic function. - Aortic valve: Moderately calcified annulus. Mildly thickened leaflets. There was mild stenosis. There was mild regurgitation. Mean gradient (S): 13 mm Hg. Valve area (VTI): 1.51  cm^2. - Mitral valve: Mildly calcified annulus. Normal thickness leaflets . - Left atrium: The atrium was moderately dilated. - Technically adequate study.   06/2015 Nuclear stress test  No diagnostic ST segment changes to indicate ischemia.  Small, mild intensity, perfusion defects noted in the apical anterior and basal inferolateral walls. This is most consistent with soft tissue attenuation given normal wall motion in these regions. No large ischemic zones noted.  This is a low risk study.  Nuclear stress EF: 64%.  05/2016 Event monitor  Rhythm is atrial fibrillation throughout study. Occasioanal PVCs  Min HR 51, Max HR 107, Avg HR 67  Reported symptoms correspond with rate controlled atrial fibrillation.  09/2016 nuclear stress  There was no ST segment deviation noted during stress.  The study is normal. There are no perfusion defects consistent with prior infarct or current ischemia.  This is a low risk study.  The left ventricular ejection fraction is normal (55-65%).   04/2017 cath  Dist RCA lesion is 40% stenosed.  Prox RCA lesion is 25% stenosed.  1st Diag lesion is 75% stenosed.  A drug-eluting stent was successfully placed using a STENT SYNERGY DES 2.25X12.  Post intervention, there is a 0% residual stenosis.  Prox LAD to Mid LAD lesion is 75% stenosed.  A drug-eluting stent was successfully placed using a STENT SYNERGY DES 3.5X38.  Post intervention, there is a 0% residual stenosis.  Prox LAD lesion is 70% stenosed.  A drug-eluting stent was successfully placed using a STENT SYNERGY DES 4X12, overlapping the prior stent.  Post intervention, there is a 0% residual stenosis.  The left ventricular systolic function is normal.  The left ventricular ejection fraction is 55-65% by visual estimate.  LV end diastolic pressure is normal.  There is no aortic valve stenosis.  Ao sat 98%, PA sat 65%; PA mean 25 mm Hg; unable to wedge the  catheter.  Normal right heat pressures.  Tortuous right subclavian making catheter navigation difficult from the right radial approach.  Continue dual antiplatelet therapy along with Eliquis for 30 days.  Restart Eliquis tomorrow.  After 30 days, stop aspirin. Continue Plavix and Eliquis.   After 6 months, can consider stopping clopidogrel if he has bleeding issues. Given the  nomber of stents, would try to complete one year of clopidogrel if no bleeding issues.  01/2020 nuclear stress  Small, mild intensity, mid to basal inferior/inferoseptal defect that is fixed and consistent with soft tissue attenuation in light of normal wall motion. No significant ischemic territories.  This is a low risk study.  Nuclear stress EF: 63%.   Labs/Other Tests and Data Reviewed:    EKG:  No ECG reviewed.  Recent Labs: 11/08/2019: Magnesium 1.7 02/07/2020: ALT 27; BUN 24; Creatinine, Ser 1.25; Hemoglobin 8.9; Platelets 296; Potassium 3.6; Sodium 140   Recent Lipid Panel Lab Results  Component Value Date/Time   CHOL 68 03/01/2019 03:20 AM   TRIG 114 03/01/2019 03:20 AM   HDL 16 (L) 03/01/2019 03:20 AM   CHOLHDL 4.3 03/01/2019 03:20 AM   LDLCALC 29 03/01/2019 03:20 AM    Wt Readings from Last 3 Encounters:  07/16/20 188 lb (85.3 kg)  06/15/20 182 lb (82.6 kg)  05/11/20 184 lb 11.2 oz (83.8 kg)     Risk Assessment/Calculations:      Objective:    Vital Signs:  Today's Vitals   08/17/20 1539  BP: 140/60  Pulse: (!) 59  Weight: 183 lb (83 kg)  Height: 5\' 7"  (1.702 m)   Body mass index is 28.66 kg/m. Normal affect. Normal speech pattern and tone. Comfortable, no apparent distress. No audible signs of sob or wheezing.   ASSESSMENT & PLAN:    1.CAD -chronic chest pains unchanged, very infrequent NG use - recent stress test without significant ischemia - we had continued prolonged eliquis and plavix due to stent burden. Has some ongoing bleeding from his foot  amputation site that is slow healing, few years out from his last stent placement, will stop plavix and continue eliquis alone in setting of his afib and CAD.   2. Chronic diastolic HF -reports swelling is controlled, continue diuretic.   3. Afib - off av nodal agents due to bradycardia. - denies symptoms, continue current meds         COVID-19 Education: The signs and symptoms of COVID-19 were discussed with the patient and how to seek care for testing (follow up with PCP or arrange E-visit).  The importance of social distancing was discussed today.  Time:   Today, I have spent 19 minutes with the patient with telehealth technology discussing the above problems.     Medication Adjustments/Labs and Tests Ordered: Current medicines are reviewed at length with the patient today.  Concerns regarding medicines are outlined above.   Tests Ordered: No orders of the defined types were placed in this encounter.   Medication Changes: No orders of the defined types were placed in this encounter.   Follow Up:  In Person in 3 month(s)  Signed, Carlyle Dolly, MD  08/17/2020 12:34 PM    [place]

## 2020-08-19 DIAGNOSIS — I5032 Chronic diastolic (congestive) heart failure: Secondary | ICD-10-CM | POA: Diagnosis not present

## 2020-08-19 DIAGNOSIS — E1122 Type 2 diabetes mellitus with diabetic chronic kidney disease: Secondary | ICD-10-CM | POA: Diagnosis not present

## 2020-08-19 DIAGNOSIS — E1151 Type 2 diabetes mellitus with diabetic peripheral angiopathy without gangrene: Secondary | ICD-10-CM | POA: Diagnosis not present

## 2020-08-19 DIAGNOSIS — N1831 Chronic kidney disease, stage 3a: Secondary | ICD-10-CM | POA: Diagnosis not present

## 2020-08-19 DIAGNOSIS — I130 Hypertensive heart and chronic kidney disease with heart failure and stage 1 through stage 4 chronic kidney disease, or unspecified chronic kidney disease: Secondary | ICD-10-CM | POA: Diagnosis not present

## 2020-08-19 DIAGNOSIS — T8743 Infection of amputation stump, right lower extremity: Secondary | ICD-10-CM | POA: Diagnosis not present

## 2020-08-19 DIAGNOSIS — D631 Anemia in chronic kidney disease: Secondary | ICD-10-CM | POA: Diagnosis not present

## 2020-08-19 DIAGNOSIS — I480 Paroxysmal atrial fibrillation: Secondary | ICD-10-CM | POA: Diagnosis not present

## 2020-08-19 DIAGNOSIS — J449 Chronic obstructive pulmonary disease, unspecified: Secondary | ICD-10-CM | POA: Diagnosis not present

## 2020-08-20 DIAGNOSIS — I130 Hypertensive heart and chronic kidney disease with heart failure and stage 1 through stage 4 chronic kidney disease, or unspecified chronic kidney disease: Secondary | ICD-10-CM | POA: Diagnosis not present

## 2020-08-20 DIAGNOSIS — T8743 Infection of amputation stump, right lower extremity: Secondary | ICD-10-CM | POA: Diagnosis not present

## 2020-08-20 DIAGNOSIS — I5032 Chronic diastolic (congestive) heart failure: Secondary | ICD-10-CM | POA: Diagnosis not present

## 2020-08-20 DIAGNOSIS — E1122 Type 2 diabetes mellitus with diabetic chronic kidney disease: Secondary | ICD-10-CM | POA: Diagnosis not present

## 2020-08-21 DIAGNOSIS — I509 Heart failure, unspecified: Secondary | ICD-10-CM | POA: Diagnosis not present

## 2020-08-21 DIAGNOSIS — I5032 Chronic diastolic (congestive) heart failure: Secondary | ICD-10-CM | POA: Diagnosis not present

## 2020-08-21 DIAGNOSIS — D631 Anemia in chronic kidney disease: Secondary | ICD-10-CM | POA: Diagnosis not present

## 2020-08-21 DIAGNOSIS — I130 Hypertensive heart and chronic kidney disease with heart failure and stage 1 through stage 4 chronic kidney disease, or unspecified chronic kidney disease: Secondary | ICD-10-CM | POA: Diagnosis not present

## 2020-08-21 DIAGNOSIS — T8743 Infection of amputation stump, right lower extremity: Secondary | ICD-10-CM | POA: Diagnosis not present

## 2020-08-24 DIAGNOSIS — E7800 Pure hypercholesterolemia, unspecified: Secondary | ICD-10-CM | POA: Diagnosis not present

## 2020-08-24 DIAGNOSIS — I10 Essential (primary) hypertension: Secondary | ICD-10-CM | POA: Diagnosis not present

## 2020-08-26 DIAGNOSIS — I480 Paroxysmal atrial fibrillation: Secondary | ICD-10-CM | POA: Diagnosis not present

## 2020-08-26 DIAGNOSIS — T8743 Infection of amputation stump, right lower extremity: Secondary | ICD-10-CM | POA: Diagnosis not present

## 2020-08-26 DIAGNOSIS — J449 Chronic obstructive pulmonary disease, unspecified: Secondary | ICD-10-CM | POA: Diagnosis not present

## 2020-08-26 DIAGNOSIS — I130 Hypertensive heart and chronic kidney disease with heart failure and stage 1 through stage 4 chronic kidney disease, or unspecified chronic kidney disease: Secondary | ICD-10-CM | POA: Diagnosis not present

## 2020-08-26 DIAGNOSIS — E1122 Type 2 diabetes mellitus with diabetic chronic kidney disease: Secondary | ICD-10-CM | POA: Diagnosis not present

## 2020-08-26 DIAGNOSIS — E1151 Type 2 diabetes mellitus with diabetic peripheral angiopathy without gangrene: Secondary | ICD-10-CM | POA: Diagnosis not present

## 2020-08-26 DIAGNOSIS — N1831 Chronic kidney disease, stage 3a: Secondary | ICD-10-CM | POA: Diagnosis not present

## 2020-08-26 DIAGNOSIS — D631 Anemia in chronic kidney disease: Secondary | ICD-10-CM | POA: Diagnosis not present

## 2020-08-26 DIAGNOSIS — I5032 Chronic diastolic (congestive) heart failure: Secondary | ICD-10-CM | POA: Diagnosis not present

## 2020-08-31 ENCOUNTER — Ambulatory Visit (INDEPENDENT_AMBULATORY_CARE_PROVIDER_SITE_OTHER): Payer: Medicare HMO | Admitting: Physician Assistant

## 2020-08-31 ENCOUNTER — Other Ambulatory Visit: Payer: Self-pay

## 2020-08-31 VITALS — BP 110/61 | HR 71 | Temp 97.3°F | Resp 16 | Ht 67.0 in | Wt 183.0 lb

## 2020-08-31 DIAGNOSIS — I7025 Atherosclerosis of native arteries of other extremities with ulceration: Secondary | ICD-10-CM

## 2020-08-31 DIAGNOSIS — I739 Peripheral vascular disease, unspecified: Secondary | ICD-10-CM | POA: Diagnosis not present

## 2020-08-31 NOTE — Progress Notes (Signed)
Office Note     CC:  follow up Requesting Provider:  Monico Blitz, MD  HPI: Mike Wright is a 87 y.o. (1933/11/06) male who presents for right TMA incision check.  This was performed by Dr. Trula Slade in November 2021 and has been slow to heal.  He had a diagnostic arteriogram prior to surgery which demonstrated diffuse tibial vessel disease but no indication for intervention at the time.  He is still wearing his Darco shoe and would prefer to switch to a regular shoe.  He and his wife believe the wound at the lateral aspect of the incision is getting better and better.  He is taking Eliquis for atrial fibrillation.  Last office visit he was noted to have gangrenous wounds on the fingers of his right hand.  Dr. Doren Custard recommended that if he was unable to heal these wounds or if he develops any new wounds that we would pursue embolic work-up with a echocardiogram and CTA chest.  Patient states he healed prior wounds quickly and may have developed a new wound on his middle finger however this is very small and not painful.  He would still like to delay embolic work-up.   Past Medical History:  Diagnosis Date  . Anemia    a. mild, noted 04/2017.  [name] CAD in native artery    a. [name] 04/2017 s/p DES to D1, DES to prox-mid LAD, DES to prox LAD overlapping the prior stent, LVEF 55-65%.   . Chronic diastolic CHF (congestive heart failure) ([place])   . Constipation   . COPD (chronic obstructive pulmonary disease) ([place])   . Diabetic ulcer of toe ([place])   . DJD (degenerative joint disease) of cervical spine   . Dysrhythmia    AFib  . Essential hypertension   . GERD (gastroesophageal reflux disease)   . History of hiatal hernia   . HIT (heparin-induced thrombocytopenia) ([place])   . Hypothyroidism   . Hypoxia    a. went home on home O2 04/2017.  [name] Insomnia   . Mixed hyperlipidemia   . PAD (peripheral artery disease) ([place])   . PAF (paroxysmal atrial fibrillation) ([place])   . PVD (peripheral vascular disease)  ([place])   . Renal insufficiency   . 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  . Retinitis   . Sinus bradycardia    a. HR 30s-40s in 04/2017 -> diltiazem stopped, metoprolol reduced.  . Sleep apnea    "chose not to order CPAP at this time" (05/18/2017)  . Type 2 diabetes mellitus ([place])   . Wears glasses   . Wheezing    a. suspected COPD 04/2017. Former tobacco x 40 years.    Past Surgical History:  Procedure Laterality Date  . ABDOMINAL AORTOGRAM W/LOWER EXTREMITY N/A 09/15/2017   Procedure: ABDOMINAL AORTOGRAM W/LOWER EXTREMITY;  Surgeon: Serafina Mitchell, MD;  Location: [place] [facility];  Service: Cardiovascular;  Laterality: N/A;  . ABDOMINAL AORTOGRAM W/LOWER EXTREMITY N/A 02/04/2020   Procedure: ABDOMINAL AORTOGRAM W/LOWER EXTREMITY;  Surgeon: Serafina Mitchell, MD;  Location: [place] [facility];  Service: Cardiovascular;  Laterality: N/A;  . AMPUTATION Bilateral 09/20/2017   Procedure: BILATERAL GREAT TOE AMPUTATIONS INCLUDING METATARSAL HEADS;  Surgeon: Serafina Mitchell, MD;  [facility];  Service: Vascular;  Laterality: Bilateral;  . AMPUTATION Right 03/01/2019   Procedure: AMPUTATION TOES;  Surgeon: Angelia Mould, MD;  Location: [place];  Service: Vascular;  Laterality: Right;  . AMPUTATION Right 05/29/2019   Procedure:  AMPUTATION RIGHT THIRD TOE;  Surgeon: Serafina Mitchell, MD;  Location: [facility];  Service: Vascular;  Laterality: Right;  . AMPUTATION Right 09/26/2019   Procedure: AMPUTATION RIGHT FOURTH TOE AND FIFTH TOES;  Surgeon: Serafina Mitchell, MD;  [facility];  Service: Vascular;  Laterality: Right;  . ANGIOPLASTY Right 09/20/2017   Procedure: ANGIOPLASTY RIGHT TIBIAL ARTERY;  Surgeon: Serafina Mitchell, MD;  Location: [place];  Service: Vascular;  Laterality: Right;  . APPENDECTOMY    . BACK SURGERY    . CARDIAC CATHETERIZATION  1980s; 2012;  . CATARACT EXTRACTION Left 2004  . COLONOSCOPY  2004   FLEISHMAN TICS  . COLONOSCOPY WITH PROPOFOL N/A 11/28/2018    Procedure: COLONOSCOPY WITH PROPOFOL;  Surgeon: Rogene Houston, MD;  [facility];  Service: Endoscopy;  Laterality: N/A;  . CORONARY ANGIOPLASTY WITH STENT PLACEMENT  05/18/2017   "3 stents"  . CORONARY STENT INTERVENTION N/A 05/18/2017   Procedure: CORONARY STENT INTERVENTION;  Surgeon: Jettie Booze, MD;  Location: [place] [facility];  Service: Cardiovascular;  Laterality: N/A;  . ESOPHAGOGASTRODUODENOSCOPY (EGD) WITH PROPOFOL N/A 11/20/2017   Procedure: ESOPHAGOGASTRODUODENOSCOPY (EGD) WITH PROPOFOL;  Surgeon: Irene Shipper, MD;  Location: [place];  Service: Gastroenterology;  Laterality: N/A;  . ESOPHAGOGASTRODUODENOSCOPY (EGD) WITH PROPOFOL N/A 11/28/2018   Procedure: ESOPHAGOGASTRODUODENOSCOPY (EGD) WITH PROPOFOL;  Surgeon: Rogene Houston, MD;  [facility];  Service: Endoscopy;  Laterality: N/A;  . ESOPHAGOGASTRODUODENOSCOPY (EGD) WITH PROPOFOL N/A 07/12/2019   Procedure: ESOPHAGOGASTRODUODENOSCOPY (EGD) WITH PROPOFOL;  Surgeon: Rogene Houston, MD;  [facility];  Service: Endoscopy;  Laterality: N/A;  125  . GIVENS CAPSULE STUDY N/A 12/17/2018   Procedure: GIVENS CAPSULE STUDY;  Surgeon: Rogene Houston, MD;  [facility];  Service: Endoscopy;  Laterality: N/A;  7:30am  . I & D EXTREMITY Right 12/17/2017   Procedure: IRRIGATION AND DEBRIDEMENT RIGHT GREAT TOE;  Surgeon: Serafina Mitchell, MD;  [facility];  Service: Vascular;  Laterality: Right;  . JOINT REPLACEMENT    . LEFT HEART CATH AND CORONARY ANGIOGRAPHY N/A 05/18/2017   Procedure: LEFT HEART CATH AND CORONARY ANGIOGRAPHY;  Surgeon: Jettie Booze, MD;  Location: [place] [facility];  Service: Cardiovascular;  Laterality: N/A;  . LOWER EXTREMITY ANGIOGRAM Right 09/20/2017   Procedure: RIGHT LOWER LEG  ANGIOGRAM;  Surgeon: Serafina Mitchell, MD;  Location: [place];  Service: Vascular;  Laterality: Right;  . LUMBAR FUSION  2002   L3, 4 L4, 5 L5 S1 Fused by Dr. Glenna Fellows  .  PERIPHERAL VASCULAR BALLOON ANGIOPLASTY Left 09/15/2017   Procedure: PERIPHERAL VASCULAR BALLOON ANGIOPLASTY;  Surgeon: Serafina Mitchell, MD;  Location: [place] [facility];  Service: Cardiovascular;  Laterality: Left;  PTA of Peroneal & Posterior Tibial  . POLYPECTOMY  11/28/2018   Procedure: POLYPECTOMY;  Surgeon: Rogene Houston, MD;  [facility];  Service: Endoscopy;;  duodenum  . POSTERIOR LUMBAR FUSION    . RHINOPLASTY    . RIGHT HEART CATH N/A 05/18/2017   Procedure: RIGHT HEART CATH;  Surgeon: Jettie Booze, MD;  Location: [place] [facility];  Service: Cardiovascular;  Laterality: N/A;  . TEE WITHOUT CARDIOVERSION N/A 09/18/2017   Procedure: TRANSESOPHAGEAL ECHOCARDIOGRAM (TEE);  Surgeon: Fay Records, MD;  Location: [place];  Service: Cardiovascular;  Laterality: N/A;  . TOTAL KNEE ARTHROPLASTY Bilateral   . TRANSMETATARSAL AMPUTATION Right 02/04/2020   Procedure: RIGHT TRANSMETATARSAL AMPUTATION;  Surgeon: Serafina Mitchell, MD;  Location: [facility] OR;  Service: Vascular;  Laterality: Right;  . TRANSURETHRAL RESECTION OF PROSTATE  2001   [place]    Social History   Socioeconomic History  . Marital status: Married    Spouse name: Not on file  . Number of children: Not on file  . Years of education: Not on file  . Highest education level: Not on file  Occupational History  . Not on file  Tobacco Use  . Smoking status: Former Smoker    Packs/day: 2.00    Years: 30.00    Pack years: 60.00    Types: Cigarettes    Start date: 03/28/1953    Quit date: 03/29/1983    Years since quitting: 37.4  . Smokeless tobacco: Never Used  Vaping Use  . Vaping Use: Never used  Substance and Sexual Activity  . Alcohol use: Not Currently    Comment: 05/18/2017 'I drink a beer q yr"  . Drug use: No  . Sexual activity: Not Currently  Other Topics Concern  . Not on file  Social History Narrative   Patient lives at home with his spouse.    Social Determinants of Health    Financial Resource Strain: Not on file  Food Insecurity: Not on file  Transportation Needs: No Transportation Needs  . Lack of Transportation (Medical): No  . Lack of Transportation (Non-Medical): No  Physical Activity: Not on file  Stress: Not on file  Social Connections: Not on file  Intimate Partner Violence: Not on file    Family History  Problem Relation Age of Onset  . Heart attack Father 17  . COPD Father   . COPD Mother   . Heart disease Mother   . Diabetes Mother   . Hypertension Sister   . CVA Sister   . Diabetes Sister   . Multiple sclerosis Sister     Current Outpatient Medications  Medication Sig Dispense Refill  . Accu-Chek Softclix Lancets lancets     . acetaminophen (TYLENOL) 500 MG tablet Take 1 tablet (500 mg total) by mouth every 6 (six) hours as needed for mild pain or headache. 30 tablet 0  . Alcohol Swabs (B-D SINGLE USE SWABS REGULAR) PADS     . atorvastatin (LIPITOR) 80 MG tablet Take 80 mg by mouth every evening.     . Blood Glucose Monitoring Suppl (ACCU-CHEK GUIDE) w/Device KIT     . Cholecalciferol (VITAMIN D3) 50 MCG (2000 UT) TABS Take 2,000 Units by mouth daily.    . Cyanocobalamin (B-12) 2500 MCG TABS Take 2,500 mcg by mouth daily.    . DROPLET INSULIN SYRINGE 31G X 5/16" 1 ML MISC     . ELIQUIS 5 MG TABS tablet TAKE 1 TABLET TWICE DAILY 017 tablet 3  . folic acid (FOLVITE) 1 MG tablet Take 1 tablet (1 mg total) by mouth daily. 30 tablet 1  . furosemide (LASIX) 80 MG tablet Take 80 mg by mouth See admin instructions. Take 80 mg by mouth in the morning and between 5-6 PM daily    . insulin glargine (LANTUS) 100 UNIT/ML injection Inject 30-40 Units into the skin See admin instructions. Inject 30 units into the skin in the morning and 40 units at bedtime    . ipratropium-albuterol (DUONEB) 0.5-2.5 (3) MG/3ML SOLN Take 3 mLs by nebulization every evening.     [name] linezolid (ZYVOX) 600 MG tablet TAKE 1 TABLET (600 MG TOTAL) BY MOUTH EVERY TWELVE  HOURS FOR 10  DAYS. 20 tablet 0  . lisinopril (ZESTRIL) 5 MG tablet Take 0.5 tablets (2.5 mg total) by mouth daily.    . meclizine (ANTIVERT) 25 MG tablet Take 25 mg by mouth daily as needed for dizziness.    . metFORMIN (GLUCOPHAGE) 1000 MG tablet Take 1,000 mg by mouth 2 (two) times daily.    . nitroGLYCERIN (NITROSTAT) 0.4 MG SL tablet PLACE 1 TABLET (0.4 MG TOTAL) UNDER THE TONGUE EVERY 5  MINUTES AS NEEDED FOR CHEST PAIN. 25 tablet 3  . OXYGEN Inhale 2 L/min into the lungs See admin instructions. 2 L/min of oxygen at bedtime and during the day as needed for shortness of breath    . pantoprazole (PROTONIX) 40 MG tablet Take 1 tablet (40 mg total) by mouth 2 (two) times daily. To protect stomach while taking multiple blood thinners. 60 tablet 2  . potassium chloride SA (K-DUR) 20 MEQ tablet Take 1 tablet (20 mEq total) by mouth daily. 30 tablet 1  . psyllium (METAMUCIL) 58.6 % packet Take 0.5 packets by mouth 2 (two) times daily. [place]    . SPIRIVA HANDIHALER 18 MCG inhalation capsule Place 1 capsule into inhaler and inhale daily after breakfast.     . traMADol (ULTRAM) 50 MG tablet Take 1 tablet (50 mg total) by mouth every 8 (eight) hours as needed. 20 tablet 0  . traZODone (DESYREL) 100 MG tablet Take 100-200 mg by mouth at bedtime.     . TRUE METRIX BLOOD GLUCOSE TEST test strip     . isosorbide mononitrate (IMDUR) 30 MG 24 hr tablet Take 1.5 tablets (45 mg total) by mouth in the morning and at bedtime. 270 tablet 3   No current facility-administered medications for this visit.    Allergies  Allergen Reactions  . Bactrim [Sulfamethoxazole-Trimethoprim] Other (See Comments)    Pancytopenia  . Codeine Shortness Of Breath  . Doxycycline Swelling    Swelling and numbness in lips and face. Swelling improved after stopping. Reports still experiences numbness in bottom lip.   Shirlean Kelly [Ferumoxytol] Other (See Comments)    Diaphoretic, chest pain  . Heparin Other (See Comments)     +HIT,  Severe bleeding (with heparin drip & large doses), tolerates low doses  . Losartan Swelling  . Oxycodone Other (See Comments)    "Made me act out of my mind" Mental status changes- hallucinations  . Latex Rash     REVIEW OF SYSTEMS:   [X]  denotes positive finding, [ ]  denotes negative finding Cardiac  Comments:  Chest pain or chest pressure:    Shortness of breath upon exertion:    Short of breath when lying flat:    Irregular heart rhythm:        Vascular    Pain in calf, thigh, or hip brought on by ambulation:    Pain in feet at night that wakes you up from your sleep:     Blood clot in your veins:    Leg swelling:         Pulmonary    Oxygen at home:    Productive cough:     Wheezing:         Neurologic    Sudden weakness in arms or legs:     Sudden numbness in arms or legs:     Sudden onset of difficulty speaking or slurred speech:    Temporary loss of vision in one eye:     Problems with dizziness:  Gastrointestinal    Blood in stool:     Vomited blood:         Genitourinary    Burning when urinating:     Blood in urine:        Psychiatric    Major depression:         Hematologic    Bleeding problems:    Problems with blood clotting too easily:        Skin    Rashes or ulcers:        Constitutional    Fever or chills:      PHYSICAL EXAMINATION:  Vitals:   08/31/20 1306  BP: 110/61  Pulse: 71  Resp: 16  Temp: (!) 97.3 F (36.3 C)  TempSrc: Temporal  SpO2: 96%  Weight: 183 lb (83 kg)  Height: 5' 7"  (1.702 m)    General:  WDWN in NAD; vital signs documented above Gait: Not observed HENT: WNL, normocephalic Pulmonary: normal non-labored breathing Cardiac: regular HR Abdomen: soft, NT, no masses Skin: without rashes Vascular Exam/Pulses:  Right Left  Radial 2+ (normal) 2+ (normal)  DP  brisk signal absent  PT  brisk signal absent   Extremities: Small healing wound of right middle finger no sign of infection; right  lateral aspect of TMA pictured below Musculoskeletal: no muscle wasting or atrophy  Neurologic: A&O X 3;  No focal weakness or paresthesias are detected Psychiatric:  The pt has Normal affect.       ASSESSMENT/PLAN:: 87 y.o. male here for wound check of slow to heal right TMA  -Right foot is well-perfused with a brisk DP and PT signal by Doppler -Excess fibrinous tissue trimmed from right lateral TMA with healthy appearing skin edges underneath; continue daily cleansing with soap and water; okay to use a regular shoe some as this will still avoid pressure to the area of the wound -Plan is to refer patient to [facility] for a shoe inserts and possibly a brace for dropfoot when lateral TMA incision heals -Recheck wound in 6 to 8 weeks we will also repeat imaging at that time   Dagoberto Ligas, PA-C [facility] [phone]  Clinic MD:   Stanford Breed on call

## 2020-09-01 ENCOUNTER — Other Ambulatory Visit: Payer: Self-pay

## 2020-09-01 DIAGNOSIS — I739 Peripheral vascular disease, unspecified: Secondary | ICD-10-CM

## 2020-09-02 DIAGNOSIS — T8743 Infection of amputation stump, right lower extremity: Secondary | ICD-10-CM | POA: Diagnosis not present

## 2020-09-02 DIAGNOSIS — J449 Chronic obstructive pulmonary disease, unspecified: Secondary | ICD-10-CM | POA: Diagnosis not present

## 2020-09-02 DIAGNOSIS — N1831 Chronic kidney disease, stage 3a: Secondary | ICD-10-CM | POA: Diagnosis not present

## 2020-09-02 DIAGNOSIS — I130 Hypertensive heart and chronic kidney disease with heart failure and stage 1 through stage 4 chronic kidney disease, or unspecified chronic kidney disease: Secondary | ICD-10-CM | POA: Diagnosis not present

## 2020-09-02 DIAGNOSIS — I5032 Chronic diastolic (congestive) heart failure: Secondary | ICD-10-CM | POA: Diagnosis not present

## 2020-09-02 DIAGNOSIS — I480 Paroxysmal atrial fibrillation: Secondary | ICD-10-CM | POA: Diagnosis not present

## 2020-09-02 DIAGNOSIS — E1151 Type 2 diabetes mellitus with diabetic peripheral angiopathy without gangrene: Secondary | ICD-10-CM | POA: Diagnosis not present

## 2020-09-02 DIAGNOSIS — E1122 Type 2 diabetes mellitus with diabetic chronic kidney disease: Secondary | ICD-10-CM | POA: Diagnosis not present

## 2020-09-02 DIAGNOSIS — D631 Anemia in chronic kidney disease: Secondary | ICD-10-CM | POA: Diagnosis not present

## 2020-09-09 DIAGNOSIS — E1151 Type 2 diabetes mellitus with diabetic peripheral angiopathy without gangrene: Secondary | ICD-10-CM | POA: Diagnosis not present

## 2020-09-09 DIAGNOSIS — J449 Chronic obstructive pulmonary disease, unspecified: Secondary | ICD-10-CM | POA: Diagnosis not present

## 2020-09-09 DIAGNOSIS — I5032 Chronic diastolic (congestive) heart failure: Secondary | ICD-10-CM | POA: Diagnosis not present

## 2020-09-09 DIAGNOSIS — I130 Hypertensive heart and chronic kidney disease with heart failure and stage 1 through stage 4 chronic kidney disease, or unspecified chronic kidney disease: Secondary | ICD-10-CM | POA: Diagnosis not present

## 2020-09-09 DIAGNOSIS — N1831 Chronic kidney disease, stage 3a: Secondary | ICD-10-CM | POA: Diagnosis not present

## 2020-09-09 DIAGNOSIS — T8743 Infection of amputation stump, right lower extremity: Secondary | ICD-10-CM | POA: Diagnosis not present

## 2020-09-09 DIAGNOSIS — I480 Paroxysmal atrial fibrillation: Secondary | ICD-10-CM | POA: Diagnosis not present

## 2020-09-09 DIAGNOSIS — E1122 Type 2 diabetes mellitus with diabetic chronic kidney disease: Secondary | ICD-10-CM | POA: Diagnosis not present

## 2020-09-09 DIAGNOSIS — D631 Anemia in chronic kidney disease: Secondary | ICD-10-CM | POA: Diagnosis not present

## 2020-09-11 ENCOUNTER — Other Ambulatory Visit: Payer: Self-pay

## 2020-09-11 NOTE — Patient Outreach (Signed)
[facility]) Care Management  [place]  09/11/2020   Mike Wright [birth year] [id number]  Subjective: Telephone call to patient for follow up. Patient reports foot continues to heal with a small area left.  Patient careful not have pressure to foot.  Discussed management. Mike Wright continues to come by for wound assessments. Discussed wound management and heart failure management.  No concerns.  Objective:   Encounter Medications:  Outpatient Encounter Medications as of 09/11/2020  Medication Sig Note   Accu-Chek Softclix Lancets lancets     acetaminophen (TYLENOL) 500 MG tablet Take 1 tablet (500 mg total) by mouth every 6 (six) hours as needed for mild pain or headache.    Alcohol Swabs (B-D SINGLE USE SWABS REGULAR) PADS     atorvastatin (LIPITOR) 80 MG tablet Take 80 mg by mouth every evening.     Blood Glucose Monitoring Suppl (ACCU-CHEK GUIDE) w/Device KIT     Cholecalciferol (VITAMIN D3) 50 MCG (2000 UT) TABS Take 2,000 Units by mouth daily.    Cyanocobalamin (B-12) 2500 MCG TABS Take 2,500 mcg by mouth daily.    DROPLET INSULIN SYRINGE 31G X 5/16" 1 ML MISC     ELIQUIS 5 MG TABS tablet TAKE 1 TABLET TWICE DAILY    folic acid (FOLVITE) 1 MG tablet Take 1 tablet (1 mg total) by mouth daily.    furosemide (LASIX) 80 MG tablet Take 80 mg by mouth See admin instructions. Take 80 mg by mouth in the morning and between 5-6 PM daily    insulin glargine (LANTUS) 100 UNIT/ML injection Inject 30-40 Units into the skin See admin instructions. Inject 30 units into the skin in the morning and 40 units at bedtime 02/02/2020: Regimen confirmed to be accurate by the patient   ipratropium-albuterol (DUONEB) 0.5-2.5 (3) MG/3ML SOLN Take 3 mLs by nebulization every evening.     isosorbide mononitrate (IMDUR) 30 MG 24 hr tablet Take 1.5 tablets (45 mg total) by mouth in the morning and at bedtime.    linezolid (ZYVOX) 600 MG tablet TAKE 1 TABLET (600 MG TOTAL) BY MOUTH EVERY  TWELVE HOURS FOR 10 DAYS.    lisinopril (ZESTRIL) 5 MG tablet Take 0.5 tablets (2.5 mg total) by mouth daily.    meclizine (ANTIVERT) 25 MG tablet Take 25 mg by mouth daily as needed for dizziness.    metFORMIN (GLUCOPHAGE) 1000 MG tablet Take 1,000 mg by mouth 2 (two) times daily.    nitroGLYCERIN (NITROSTAT) 0.4 MG SL tablet PLACE 1 TABLET (0.4 MG TOTAL) UNDER THE TONGUE EVERY 5  MINUTES AS NEEDED FOR CHEST PAIN.    OXYGEN Inhale 2 L/min into the lungs See admin instructions. 2 L/min of oxygen at bedtime and during the day as needed for shortness of breath    pantoprazole (PROTONIX) 40 MG tablet Take 1 tablet (40 mg total) by mouth 2 (two) times daily. To protect stomach while taking multiple blood thinners.    potassium chloride SA (K-DUR) 20 MEQ tablet Take 1 tablet (20 mEq total) by mouth daily.    psyllium (METAMUCIL) 58.6 % packet Take 0.5 packets by mouth 2 (two) times daily. MIX AND DRINK    SPIRIVA HANDIHALER 18 MCG inhalation capsule Place 1 capsule into inhaler and inhale daily after breakfast.     traMADol (ULTRAM) 50 MG tablet Take 1 tablet (50 mg total) by mouth every 8 (eight) hours as needed.    traZODone (DESYREL) 100 MG tablet Take 100-200 mg by mouth at bedtime.  TRUE METRIX BLOOD GLUCOSE TEST test strip     No facility-administered encounter medications on file as of 09/11/2020.    Functional Status:  In your present state of health, do you have any difficulty performing the following activities: 09/11/2020 02/10/2020  Hearing? N N  Vision? N N  Difficulty concentrating or making decisions? N N  Walking or climbing stairs? Y Y  Comment weakness and amputation weakness and amputation  Dressing or bathing? N N  Doing errands, shopping? N N  Preparing Food and eating ? N N  Using the Toilet? N N  In the past six months, have you accidently leaked urine? Mike Wright  Comment wears depends wears depends  Do you have problems with loss of bowel control? N N  Managing your  Medications? N N  Managing your Finances? N N  Housekeeping or managing your Housekeeping? Mike Wright  Comment wife assists wife assists  Some recent data might be hidden    Fall/Depression Screening: Fall Risk  03/10/2020 02/10/2020 08/14/2019  Falls in the past year? 1 1 1   Comment - - week ago in the yard no injuries  Number falls in past yr: 1 0 0  Injury with Fall? 0 0 0  Risk for fall due to : Orthopedic patient - -  Follow up Falls prevention discussed - Falls prevention discussed   PHQ 2/9 Scores 02/10/2020 11/07/2019 07/29/2019 06/12/2019 12/20/2018  PHQ - 2 Score 0 0 0 0 0    Assessment:   Care Plan There are no care plans that you recently modified to display for this patient.    Goals Addressed               This Visit's Progress     COMPLETED: THN-Make and Keep All Appointments   On track     Timeframe:  Long-Range Goal Priority:  High Start Date:  03/24/20                           Expected End Date:   11/25/20          Follow Up Date 08/25/20   - call to cancel if needed    Why is this important?   Part of staying healthy is seeing the doctor for follow-up care.  If you forget your appointments, there are some things you can do to stay on track.    Notes: 05/02/20 Patient sees physicians as scheduled.  No transportation problems.       THN-Monitor foot wound for infection (pt-stated)   On track     Barriers: Knowledge Timeframe:  Short-Term Goal Priority:  High Start Date:   03/24/20                          Expected End Date:   12/25/20        Follow Up Date 10/25/20   - clean and dry skin well       Why is this important?   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   Taking really good care of your skin will help to keep your skin unbroken.    Notes: Patient denies signs of infection.  Wet to dry dressings daily. Wound closing up slowly.  Home health nurse 2/week. 04/15/20-patient with recent debridement of right foot surgical site.   Area now red with some bleeding to site per patient.  Santyl  dressing daily. 05/02/20 Patient reports wound is doing good.  He did have a bleeding episode that was stopped with pressure to site. Home health continues 2/wk visits.   06/29/20 Wound almost healed.  Discussed continued use of boot to relieve pressure. 09/11/20 Wound almost healed.  Patient careful not to wear anything on foot that causes pressure. Wound Management Discussed Take antibiotics as prescribed Monitor for swelling, redness, pain, pus, and fever Keep wound clean and dry.   Notify physician immediately for any changes         THN-Track and Manage Symptoms   On track     Barriers: Health Behaviors Knowledge Timeframe:  Long-Range Goal Priority:  High Start Date:  03/24/20                           Expected End Date: 03/27/21  Follow Up Date 10/25/20  - follow rescue plan if symptoms flare-up - know when to call the doctor    Why is this important?   You will be able to handle your symptoms better if you keep track of them.  Making some simple changes to your lifestyle will help.  Eating healthy is one thing you can do to take good care of yourself.    Notes: 04/15/20 Weight 177 lbs.  05-02-20 Patient weight 179 lbs.   06/29/20 Patient weight 180 lbs. Keep up the great work! 09/11/20 Weight 182 lbs.  Continue weighing. Heart Failure Management Discussed Please weight daily or as ordered by your doctor Report to your doctor weight gain of 2 -3 pounds in a day or 5 pounds in a week Limit salt intake Monitor for shortness of breath, swelling of feet, ankles or abdomen and weight gain          Plan:  Follow-up: Patient agrees to Care Plan and Follow-up. Follow-up in 4-6 week(s)

## 2020-09-15 NOTE — Patient Instructions (Signed)
Goals Addressed               This Visit's Progress     COMPLETED: THN-Make and Keep All Appointments   On track     Timeframe:  Long-Range Goal Priority:  High Start Date:  03/24/20                           Expected End Date:   11/25/20          Follow Up Date 08/25/20   - call to cancel if needed    Why is this important?   Part of staying healthy is seeing the doctor for follow-up care.  If you forget your appointments, there are some things you can do to stay on track.    Notes: 05/02/20 Patient sees physicians as scheduled.  No transportation problems.       THN-Monitor foot wound for infection (pt-stated)   On track     Barriers: Knowledge Timeframe:  Short-Term Goal Priority:  High Start Date:   03/24/20                          Expected End Date:   12/25/20        Follow Up Date 10/25/20   - clean and dry skin well       Why is this important?   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   Taking really good care of your skin will help to keep your skin unbroken.    Notes: Patient denies signs of infection.  Wet to dry dressings daily. Wound closing up slowly.  Home health nurse 2/week. 04/15/20-patient with recent debridement of right foot surgical site.  Area now red with some bleeding to site per patient.  Santyl dressing daily. 05/02/20 Patient reports wound is doing good.  He did have a bleeding episode that was stopped with pressure to site. Home health continues 2/wk visits.   06/29/20 Wound almost healed.  Discussed continued use of boot to relieve pressure. 09/11/20 Wound almost healed.  Patient careful not to wear anything on foot that causes pressure. Wound Management Discussed Take antibiotics as prescribed Monitor for swelling, redness, pain, pus, and fever Keep wound clean and dry.   Notify physician immediately for any changes         THN-Track and Manage Symptoms   On track     Barriers: Health Behaviors Knowledge Timeframe:   Long-Range Goal Priority:  High Start Date:  03/24/20                           Expected End Date: 03/27/21  Follow Up Date 10/25/20  - follow rescue plan if symptoms flare-up - know when to call the doctor    Why is this important?   You will be able to handle your symptoms better if you keep track of them.  Making some simple changes to your lifestyle will help.  Eating healthy is one thing you can do to take good care of yourself.    Notes: 04/15/20 Weight 177 lbs.  05-02-20 Patient weight 179 lbs.   06/29/20 Patient weight 180 lbs. Keep up the great work! 09/11/20 Weight 182 lbs.  Continue weighing. Heart Failure Management Discussed Please weight daily or as ordered by your doctor Report to your doctor weight gain of 2 -3 pounds in  a day or 5 pounds in a week Limit salt intake Monitor for shortness of breath, swelling of feet, ankles or abdomen and weight gain

## 2020-09-16 DIAGNOSIS — N1831 Chronic kidney disease, stage 3a: Secondary | ICD-10-CM | POA: Diagnosis not present

## 2020-09-16 DIAGNOSIS — T8743 Infection of amputation stump, right lower extremity: Secondary | ICD-10-CM | POA: Diagnosis not present

## 2020-09-16 DIAGNOSIS — I130 Hypertensive heart and chronic kidney disease with heart failure and stage 1 through stage 4 chronic kidney disease, or unspecified chronic kidney disease: Secondary | ICD-10-CM | POA: Diagnosis not present

## 2020-09-16 DIAGNOSIS — I480 Paroxysmal atrial fibrillation: Secondary | ICD-10-CM | POA: Diagnosis not present

## 2020-09-16 DIAGNOSIS — E1122 Type 2 diabetes mellitus with diabetic chronic kidney disease: Secondary | ICD-10-CM | POA: Diagnosis not present

## 2020-09-16 DIAGNOSIS — E1151 Type 2 diabetes mellitus with diabetic peripheral angiopathy without gangrene: Secondary | ICD-10-CM | POA: Diagnosis not present

## 2020-09-16 DIAGNOSIS — D631 Anemia in chronic kidney disease: Secondary | ICD-10-CM | POA: Diagnosis not present

## 2020-09-16 DIAGNOSIS — I5032 Chronic diastolic (congestive) heart failure: Secondary | ICD-10-CM | POA: Diagnosis not present

## 2020-09-16 DIAGNOSIS — J449 Chronic obstructive pulmonary disease, unspecified: Secondary | ICD-10-CM | POA: Diagnosis not present

## 2020-09-21 DIAGNOSIS — I509 Heart failure, unspecified: Secondary | ICD-10-CM | POA: Diagnosis not present

## 2020-09-23 DIAGNOSIS — J449 Chronic obstructive pulmonary disease, unspecified: Secondary | ICD-10-CM | POA: Diagnosis not present

## 2020-09-23 DIAGNOSIS — I480 Paroxysmal atrial fibrillation: Secondary | ICD-10-CM | POA: Diagnosis not present

## 2020-09-23 DIAGNOSIS — E1151 Type 2 diabetes mellitus with diabetic peripheral angiopathy without gangrene: Secondary | ICD-10-CM | POA: Diagnosis not present

## 2020-09-23 DIAGNOSIS — N1831 Chronic kidney disease, stage 3a: Secondary | ICD-10-CM | POA: Diagnosis not present

## 2020-09-23 DIAGNOSIS — I130 Hypertensive heart and chronic kidney disease with heart failure and stage 1 through stage 4 chronic kidney disease, or unspecified chronic kidney disease: Secondary | ICD-10-CM | POA: Diagnosis not present

## 2020-09-23 DIAGNOSIS — E1122 Type 2 diabetes mellitus with diabetic chronic kidney disease: Secondary | ICD-10-CM | POA: Diagnosis not present

## 2020-09-23 DIAGNOSIS — D631 Anemia in chronic kidney disease: Secondary | ICD-10-CM | POA: Diagnosis not present

## 2020-09-23 DIAGNOSIS — T8743 Infection of amputation stump, right lower extremity: Secondary | ICD-10-CM | POA: Diagnosis not present

## 2020-09-23 DIAGNOSIS — I5032 Chronic diastolic (congestive) heart failure: Secondary | ICD-10-CM | POA: Diagnosis not present

## 2020-09-30 DIAGNOSIS — I130 Hypertensive heart and chronic kidney disease with heart failure and stage 1 through stage 4 chronic kidney disease, or unspecified chronic kidney disease: Secondary | ICD-10-CM | POA: Diagnosis not present

## 2020-09-30 DIAGNOSIS — I5032 Chronic diastolic (congestive) heart failure: Secondary | ICD-10-CM | POA: Diagnosis not present

## 2020-09-30 DIAGNOSIS — D631 Anemia in chronic kidney disease: Secondary | ICD-10-CM | POA: Diagnosis not present

## 2020-09-30 DIAGNOSIS — E1122 Type 2 diabetes mellitus with diabetic chronic kidney disease: Secondary | ICD-10-CM | POA: Diagnosis not present

## 2020-09-30 DIAGNOSIS — E1151 Type 2 diabetes mellitus with diabetic peripheral angiopathy without gangrene: Secondary | ICD-10-CM | POA: Diagnosis not present

## 2020-09-30 DIAGNOSIS — N1831 Chronic kidney disease, stage 3a: Secondary | ICD-10-CM | POA: Diagnosis not present

## 2020-09-30 DIAGNOSIS — J449 Chronic obstructive pulmonary disease, unspecified: Secondary | ICD-10-CM | POA: Diagnosis not present

## 2020-09-30 DIAGNOSIS — I480 Paroxysmal atrial fibrillation: Secondary | ICD-10-CM | POA: Diagnosis not present

## 2020-09-30 DIAGNOSIS — T8743 Infection of amputation stump, right lower extremity: Secondary | ICD-10-CM | POA: Diagnosis not present

## 2020-10-07 DIAGNOSIS — E1151 Type 2 diabetes mellitus with diabetic peripheral angiopathy without gangrene: Secondary | ICD-10-CM | POA: Diagnosis not present

## 2020-10-07 DIAGNOSIS — N1831 Chronic kidney disease, stage 3a: Secondary | ICD-10-CM | POA: Diagnosis not present

## 2020-10-07 DIAGNOSIS — I130 Hypertensive heart and chronic kidney disease with heart failure and stage 1 through stage 4 chronic kidney disease, or unspecified chronic kidney disease: Secondary | ICD-10-CM | POA: Diagnosis not present

## 2020-10-07 DIAGNOSIS — T8743 Infection of amputation stump, right lower extremity: Secondary | ICD-10-CM | POA: Diagnosis not present

## 2020-10-07 DIAGNOSIS — I5032 Chronic diastolic (congestive) heart failure: Secondary | ICD-10-CM | POA: Diagnosis not present

## 2020-10-07 DIAGNOSIS — J449 Chronic obstructive pulmonary disease, unspecified: Secondary | ICD-10-CM | POA: Diagnosis not present

## 2020-10-07 DIAGNOSIS — I480 Paroxysmal atrial fibrillation: Secondary | ICD-10-CM | POA: Diagnosis not present

## 2020-10-07 DIAGNOSIS — E1122 Type 2 diabetes mellitus with diabetic chronic kidney disease: Secondary | ICD-10-CM | POA: Diagnosis not present

## 2020-10-07 DIAGNOSIS — D631 Anemia in chronic kidney disease: Secondary | ICD-10-CM | POA: Diagnosis not present

## 2020-10-08 ENCOUNTER — Other Ambulatory Visit: Payer: Self-pay

## 2020-10-08 NOTE — Patient Outreach (Signed)
[facility]) Care Management  [place]  10/08/2020   Mike Wright 25-May-1933 [id number]  Subjective: Telephone call to patient for follow up. Patient reports he is doing well.  Foot wound healing well.  Discussed foot care.  Weight 184 lbs and trending up.  Discussed weight trending up and avoiding heart failure exacerbation. He verbalized understanding.    Objective:   Encounter Medications:  Outpatient Encounter Medications as of 10/08/2020  Medication Sig Note   Accu-Chek Softclix Lancets lancets     acetaminophen (TYLENOL) 500 MG tablet Take 1 tablet (500 mg total) by mouth every 6 (six) hours as needed for mild pain or headache.    Alcohol Swabs (B-D SINGLE USE SWABS REGULAR) PADS     atorvastatin (LIPITOR) 80 MG tablet Take 80 mg by mouth every evening.     Blood Glucose Monitoring Suppl (ACCU-CHEK GUIDE) w/Device KIT     Cholecalciferol (VITAMIN D3) 50 MCG (2000 UT) TABS Take 2,000 Units by mouth daily.    Cyanocobalamin (B-12) 2500 MCG TABS Take 2,500 mcg by mouth daily.    DROPLET INSULIN SYRINGE 31G X 5/16" 1 ML MISC     ELIQUIS 5 MG TABS tablet TAKE 1 TABLET TWICE DAILY    folic acid (FOLVITE) 1 MG tablet Take 1 tablet (1 mg total) by mouth daily.    furosemide (LASIX) 80 MG tablet Take 80 mg by mouth See admin instructions. Take 80 mg by mouth in the morning and between 5-6 PM daily    insulin glargine (LANTUS) 100 UNIT/ML injection Inject 30-40 Units into the skin See admin instructions. Inject 30 units into the skin in the morning and 40 units at bedtime 02/02/2020: Regimen confirmed to be accurate by the patient   ipratropium-albuterol (DUONEB) 0.5-2.5 (3) MG/3ML SOLN Take 3 mLs by nebulization every evening.     isosorbide mononitrate (IMDUR) 30 MG 24 hr tablet Take 1.5 tablets (45 mg total) by mouth in the morning and at bedtime.    linezolid (ZYVOX) 600 MG tablet TAKE 1 TABLET (600 MG TOTAL) BY MOUTH EVERY TWELVE HOURS FOR 10 DAYS.     lisinopril (ZESTRIL) 5 MG tablet Take 0.5 tablets (2.5 mg total) by mouth daily.    meclizine (ANTIVERT) 25 MG tablet Take 25 mg by mouth daily as needed for dizziness.    metFORMIN (GLUCOPHAGE) 1000 MG tablet Take 1,000 mg by mouth 2 (two) times daily.    nitroGLYCERIN (NITROSTAT) 0.4 MG SL tablet PLACE 1 TABLET (0.4 MG TOTAL) UNDER THE TONGUE EVERY 5  MINUTES AS NEEDED FOR CHEST PAIN.    OXYGEN Inhale 2 L/min into the lungs See admin instructions. 2 L/min of oxygen at bedtime and during the day as needed for shortness of breath    pantoprazole (PROTONIX) 40 MG tablet Take 1 tablet (40 mg total) by mouth 2 (two) times daily. To protect stomach while taking multiple blood thinners.    potassium chloride SA (K-DUR) 20 MEQ tablet Take 1 tablet (20 mEq total) by mouth daily.    psyllium (METAMUCIL) 58.6 % packet Take 0.5 packets by mouth 2 (two) times daily. MIX AND DRINK    SPIRIVA HANDIHALER 18 MCG inhalation capsule Place 1 capsule into inhaler and inhale daily after breakfast.     traMADol (ULTRAM) 50 MG tablet Take 1 tablet (50 mg total) by mouth every 8 (eight) hours as needed.    traZODone (DESYREL) 100 MG tablet Take 100-200 mg by mouth at bedtime.  TRUE METRIX BLOOD GLUCOSE TEST test strip     No facility-administered encounter medications on file as of 10/08/2020.    Functional Status:  In your present state of health, do you have any difficulty performing the following activities: 10/08/2020 09/11/2020  Hearing? N N  Vision? N N  Difficulty concentrating or making decisions? N N  Walking or climbing stairs? Y Y  Comment weakness and amputation weakness and amputation  Dressing or bathing? N N  Doing errands, shopping? N N  Preparing Food and eating ? N N  Using the Toilet? N N  In the past six months, have you accidently leaked urine? [name]  Comment wears depends wears depends  Do you have problems with loss of bowel control? N N  Managing your Medications? N N  Managing your  Finances? N N  Housekeeping or managing your Housekeeping? [name]  Comment wife assists wife assists  Some recent data might be hidden    Fall/Depression Screening: Fall Risk  10/08/2020 03/10/2020 02/10/2020  Falls in the past year? 1 1 1   Comment - - -  Number falls in past yr: 1 1 0  Injury with Fall? 0 0 0  Risk for fall due to : Orthopedic patient;Impaired balance/gait Orthopedic patient -  Follow up Falls prevention discussed Falls prevention discussed -   PHQ 2/9 Scores 10/08/2020 02/10/2020 11/07/2019 07/29/2019 06/12/2019 12/20/2018  PHQ - 2 Score 0 0 0 0 0 0    Assessment:   Care Plan Care Plan : Heart Failure (Adult)  Updates made by Jon Billings, RN since 10/08/2020 12:00 AM     Problem: Symptom Exacerbation (Heart Failure)      Long-Range Goal: Symptom Exacerbation Prevented or Minimized as evidenced by patient continuing to monitor weights regularly.   Start Date: 02/10/2020  Expected End Date: 03/27/2021  Recent Progress: On track  Priority: High  Note:   Evidence-based guidance: Establish a mutually-agreed-upon early intervention process to communicate with primary care provider when signs/symptoms worsen.  Notes: 06/29/20   Patient weighs daily and is aware of worsening symptoms.   10/08/20 Patient continues to weight daily. Weight 184 lbs and trending upward.  Discussed continuing to monitor weights and limit salt intake due to trending upward.      Task: Identify and Minimize Risk of Heart Failure Exacerbation   Due Date: 03/27/2021  Priority: Routine  Responsible User: Jon Billings, RN  Note:   Care Management Activities:    - rescue (action) plan reviewed - self-awareness of signs/symptoms of worsening disease encouraged    Notes: 05-01-20 Patient knows signs of heart failure and when to notify physician.  06/29/20 Reiterated signs of heart failure and importance of weights.   10/08/20 Patient continues to weight daily. Weight 184 lbs and trending upward.   Discussed continuing to monitor weights and limit salt intake due to trending upward.   Heart Failure Management Discussed Please weight daily or as ordered by your doctor Report to your doctor weight gain of 2 -3 pounds in a day or 5 pounds in a week Limit salt intake Monitor for shortness of breath, swelling of feet, ankles or abdomen and weight gain. Never use the saltshaker.  Read all food labels and avoid canned, processed, and pickled foods.   Follow your doctor's recommendations for daily salt intake      Goals Addressed               This Visit's Progress     THN-Monitor  foot wound for infection (pt-stated)   On track     Barriers: Knowledge Timeframe:  Short-Term Goal Priority:  High Start Date:   03/24/20                          Expected End Date:   12/25/20        Follow Up Date 11/25/20   - clean and dry skin well         Why is this important?   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   Taking really good care of your skin will help to keep your skin unbroken.    Notes: Patient denies signs of infection.  Wet to dry dressings daily. Wound closing up slowly.  Home health nurse 2/week. 04/15/20-patient with recent debridement of right foot surgical site.  Area now red with some bleeding to site per patient.  Santyl dressing daily. 05/02/20 Patient reports wound is doing good.  He did have a bleeding episode that was stopped with pressure to site. Home health continues 2/wk visits.   06/29/20 Wound almost healed.  Discussed continued use of boot to relieve pressure. 09/11/20 Wound almost healed.  Patient careful not to wear anything on foot that causes pressure. Wound Management Discussed Take antibiotics as prescribed Monitor for swelling, redness, pain, pus, and fever Keep wound clean and dry.   Notify physician immediately for any changes   10/08/20 Patient reports only a small area open now and just using a Band-Aid to cover.  Discussed wearing  shoes and avoid wearing shoes that would apply pressure to area.   Wound Management Reiterated Monitor for swelling, redness, pain, pus, and fever Keep wound clean and dry.   Notify physician immediately for any changes         THN-Track and Manage Symptoms   On track     Barriers: Health Behaviors Knowledge Timeframe:  Long-Range Goal Priority:  High Start Date:  03/24/20                           Expected End Date: 03/27/21  Follow up 11/25/20  - follow rescue plan if symptoms flare-up - know when to call the doctor    Why is this important?   You will be able to handle your symptoms better if you keep track of them.  Making some simple changes to your lifestyle will help.  Eating healthy is one thing you can do to take good care of yourself.    Notes: 04/15/20 Weight 177 lbs.  05-02-20 Patient weight 179 lbs.   06/29/20 Patient weight 180 lbs. Keep up the great work! 09/11/20 Weight 182 lbs.  Continue weighing. Heart Failure Management Discussed Please weight daily or as ordered by your doctor Report to your doctor weight gain of 2 -3 pounds in a day or 5 pounds in a week Limit salt intake Monitor for shortness of breath, swelling of feet, ankles or abdomen and weight gain  /14/22 Patient continues to weight daily. Weight 184 lbs and trending upward.  Discussed continuing to monitor weights and limit salt intake due to trending upward.   Heart Failure Management Reiterated Please weight daily or as ordered by your doctor Report to your doctor weight gain of 2 -3 pounds in a day or 5 pounds in a week Limit salt intake Monitor for shortness of breath, swelling of feet, ankles or abdomen  and weight gain. Never use the saltshaker.  Read all food labels and avoid canned, processed, and pickled foods.   Follow your doctor's recommendations for daily salt intake         Plan:  Follow-up: Patient agrees to Care Plan and Follow-up. Follow-up in 6-8 week(s)  [name],  RN, MSN [place] [facility] Care Management Coordinator Direct Line [phone] Cell [phone] Toll Free: [phone]  Fax: [phone]

## 2020-10-08 NOTE — Patient Instructions (Signed)
Goals Addressed               This Visit's Progress     THN-Monitor foot wound for infection (pt-stated)   On track     Barriers: Knowledge Timeframe:  Short-Term Goal Priority:  High Start Date:   03/24/20                          Expected End Date:   12/25/20        Follow Up Date 11/25/20   - clean and dry skin well         Why is this important?   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   Taking really good care of your skin will help to keep your skin unbroken.    Notes: Patient denies signs of infection.  Wet to dry dressings daily. Wound closing up slowly.  Home health nurse 2/week. 04/15/20-patient with recent debridement of right foot surgical site.  Area now red with some bleeding to site per patient.  Santyl dressing daily. 05/02/20 Patient reports wound is doing good.  He did have a bleeding episode that was stopped with pressure to site. Home health continues 2/wk visits.   06/29/20 Wound almost healed.  Discussed continued use of boot to relieve pressure. 09/11/20 Wound almost healed.  Patient careful not to wear anything on foot that causes pressure. Wound Management Discussed Take antibiotics as prescribed Monitor for swelling, redness, pain, pus, and fever Keep wound clean and dry.   Notify physician immediately for any changes   10/08/20 Patient reports only a small area open now and just using a Band-Aid to cover.  Discussed wearing shoes and avoid wearing shoes that would apply pressure to area.   Wound Management Reiterated Monitor for swelling, redness, pain, pus, and fever Keep wound clean and dry.   Notify physician immediately for any changes         THN-Track and Manage Symptoms   On track     Barriers: Health Behaviors Knowledge Timeframe:  Long-Range Goal Priority:  High Start Date:  03/24/20                           Expected End Date: 03/27/21  Follow up 11/25/20  - follow rescue plan if symptoms flare-up - know when to  call the doctor    Why is this important?   You will be able to handle your symptoms better if you keep track of them.  Making some simple changes to your lifestyle will help.  Eating healthy is one thing you can do to take good care of yourself.    Notes: 04/15/20 Weight 177 lbs.  05-02-20 Patient weight 179 lbs.   06/29/20 Patient weight 180 lbs. Keep up the great work! 09/11/20 Weight 182 lbs.  Continue weighing. Heart Failure Management Discussed Please weight daily or as ordered by your doctor Report to your doctor weight gain of 2 -3 pounds in a day or 5 pounds in a week Limit salt intake Monitor for shortness of breath, swelling of feet, ankles or abdomen and weight gain  /14/22 Patient continues to weight daily. Weight 184 lbs and trending upward.  Discussed continuing to monitor weights and limit salt intake due to trending upward.   Heart Failure Management Reiterated Please weight daily or as ordered by your doctor Report to your doctor weight gain of 2 -3  pounds in a day or 5 pounds in a week Limit salt intake Monitor for shortness of breath, swelling of feet, ankles or abdomen and weight gain. Never use the saltshaker.  Read all food labels and avoid canned, processed, and pickled foods.   Follow your doctor's recommendations for daily salt intake

## 2020-10-12 DIAGNOSIS — Z9981 Dependence on supplemental oxygen: Secondary | ICD-10-CM | POA: Diagnosis not present

## 2020-10-12 DIAGNOSIS — E119 Type 2 diabetes mellitus without complications: Secondary | ICD-10-CM | POA: Diagnosis not present

## 2020-10-12 DIAGNOSIS — D649 Anemia, unspecified: Secondary | ICD-10-CM | POA: Diagnosis not present

## 2020-10-12 DIAGNOSIS — J9611 Chronic respiratory failure with hypoxia: Secondary | ICD-10-CM | POA: Diagnosis not present

## 2020-10-12 DIAGNOSIS — Z89412 Acquired absence of left great toe: Secondary | ICD-10-CM | POA: Diagnosis not present

## 2020-10-12 DIAGNOSIS — Z6828 Body mass index (BMI) 28.0-28.9, adult: Secondary | ICD-10-CM | POA: Diagnosis not present

## 2020-10-12 DIAGNOSIS — Z7901 Long term (current) use of anticoagulants: Secondary | ICD-10-CM | POA: Diagnosis not present

## 2020-10-12 DIAGNOSIS — Z89431 Acquired absence of right foot: Secondary | ICD-10-CM | POA: Diagnosis not present

## 2020-10-12 DIAGNOSIS — D692 Other nonthrombocytopenic purpura: Secondary | ICD-10-CM | POA: Diagnosis not present

## 2020-10-12 DIAGNOSIS — I5032 Chronic diastolic (congestive) heart failure: Secondary | ICD-10-CM | POA: Diagnosis not present

## 2020-10-12 DIAGNOSIS — M0600 Rheumatoid arthritis without rheumatoid factor, unspecified site: Secondary | ICD-10-CM | POA: Diagnosis not present

## 2020-10-13 DIAGNOSIS — E1165 Type 2 diabetes mellitus with hyperglycemia: Secondary | ICD-10-CM | POA: Diagnosis not present

## 2020-10-13 DIAGNOSIS — Z1331 Encounter for screening for depression: Secondary | ICD-10-CM | POA: Diagnosis not present

## 2020-10-13 DIAGNOSIS — Z0000 Encounter for general adult medical examination without abnormal findings: Secondary | ICD-10-CM | POA: Diagnosis not present

## 2020-10-13 DIAGNOSIS — Z7189 Other specified counseling: Secondary | ICD-10-CM | POA: Diagnosis not present

## 2020-10-13 DIAGNOSIS — D692 Other nonthrombocytopenic purpura: Secondary | ICD-10-CM | POA: Diagnosis not present

## 2020-10-13 DIAGNOSIS — I5032 Chronic diastolic (congestive) heart failure: Secondary | ICD-10-CM | POA: Diagnosis not present

## 2020-10-13 DIAGNOSIS — Z1339 Encounter for screening examination for other mental health and behavioral disorders: Secondary | ICD-10-CM | POA: Diagnosis not present

## 2020-10-13 DIAGNOSIS — R5383 Other fatigue: Secondary | ICD-10-CM | POA: Diagnosis not present

## 2020-10-13 DIAGNOSIS — E7800 Pure hypercholesterolemia, unspecified: Secondary | ICD-10-CM | POA: Diagnosis not present

## 2020-10-13 DIAGNOSIS — Z79899 Other long term (current) drug therapy: Secondary | ICD-10-CM | POA: Diagnosis not present

## 2020-10-13 DIAGNOSIS — Z125 Encounter for screening for malignant neoplasm of prostate: Secondary | ICD-10-CM | POA: Diagnosis not present

## 2020-10-13 DIAGNOSIS — J9611 Chronic respiratory failure with hypoxia: Secondary | ICD-10-CM | POA: Diagnosis not present

## 2020-10-13 DIAGNOSIS — I10 Essential (primary) hypertension: Secondary | ICD-10-CM | POA: Diagnosis not present

## 2020-10-15 ENCOUNTER — Other Ambulatory Visit: Payer: Self-pay

## 2020-10-15 ENCOUNTER — Encounter (INDEPENDENT_AMBULATORY_CARE_PROVIDER_SITE_OTHER): Payer: Medicare HMO | Admitting: Ophthalmology

## 2020-10-15 DIAGNOSIS — H43813 Vitreous degeneration, bilateral: Secondary | ICD-10-CM

## 2020-10-15 DIAGNOSIS — I10 Essential (primary) hypertension: Secondary | ICD-10-CM | POA: Diagnosis not present

## 2020-10-15 DIAGNOSIS — H35033 Hypertensive retinopathy, bilateral: Secondary | ICD-10-CM | POA: Diagnosis not present

## 2020-10-15 DIAGNOSIS — H353122 Nonexudative age-related macular degeneration, left eye, intermediate dry stage: Secondary | ICD-10-CM | POA: Diagnosis not present

## 2020-10-15 DIAGNOSIS — E113291 Type 2 diabetes mellitus with mild nonproliferative diabetic retinopathy without macular edema, right eye: Secondary | ICD-10-CM

## 2020-10-15 DIAGNOSIS — H348122 Central retinal vein occlusion, left eye, stable: Secondary | ICD-10-CM

## 2020-10-15 DIAGNOSIS — H353111 Nonexudative age-related macular degeneration, right eye, early dry stage: Secondary | ICD-10-CM | POA: Diagnosis not present

## 2020-10-21 DIAGNOSIS — I509 Heart failure, unspecified: Secondary | ICD-10-CM | POA: Diagnosis not present

## 2020-11-02 ENCOUNTER — Ambulatory Visit (INDEPENDENT_AMBULATORY_CARE_PROVIDER_SITE_OTHER): Payer: Medicare HMO | Admitting: Physician Assistant

## 2020-11-02 ENCOUNTER — Other Ambulatory Visit: Payer: Self-pay

## 2020-11-02 ENCOUNTER — Ambulatory Visit (INDEPENDENT_AMBULATORY_CARE_PROVIDER_SITE_OTHER)
Admission: RE | Admit: 2020-11-02 | Discharge: 2020-11-02 | Disposition: A | Discharge status: Home / Self Care | Payer: Medicare HMO | Source: Ambulatory Visit | Attending: Surgery | Admitting: Surgery

## 2020-11-02 ENCOUNTER — Ambulatory Visit (HOSPITAL_COMMUNITY)
Admission: RE | Admit: 2020-11-02 | Discharge: 2020-11-02 | Disposition: A | Discharge status: Home / Self Care | Payer: Medicare HMO | Source: Ambulatory Visit | Attending: Surgery | Admitting: Surgery

## 2020-11-02 VITALS — BP 139/73 | HR 60 | Temp 97.4°F | Resp 16 | Ht 67.0 in | Wt 182.0 lb

## 2020-11-02 DIAGNOSIS — I739 Peripheral vascular disease, unspecified: Secondary | ICD-10-CM

## 2020-11-02 NOTE — Progress Notes (Signed)
HISTORY AND PHYSICAL     CC:  follow up. Requesting Provider:  Monico Blitz, MD  HPI: This is a 87 y.o. male who is here today for follow up for PAD.  He has hx of right TMA in November 2021 by Dr. Trula Slade and this has been slow to heal.  He had a diagnostic arteriogram prior to surgery which demonstrated diffuse tibial vessel disease but no indication for intervention at the time.  At a previous office visit,  he was noted to have gangrenous wounds on the fingers of his right hand.  Dr. Doren Custard recommended that if he was unable to heal these wounds or if he develops any new wounds that we would pursue embolic work-up with a echocardiogram and CTA chest.  he wanted to delay embolic workup.    He has hx of angioplasty of left PTA and peroneal arteries June 2019, angioplasty of right PTA and amputation of right and left great toe also in June 2019.  He had I&D of right great toe in September 2019.  He had amputation of right 2nd toe December 2020.  He had right 3rd toe ray amputation March 2021.  Amputation of right 4th and 5th toes July 2021.  Right TMA November 2021.   Pt was last seen 08/31/2020 and at that time, his TMA incision was close to being healed.  He was referred to [facility] for shoe insert and possibly a brace for footdrop when lateral TMA incision heals.  He was scheduled for imaging in 6-8 weeks and he is here today for that visit.   The pt returns today for follow up and is with his wife.  He states that his foot still has not completely healed.  It does still have some drainage.  He wants to wear a normal shoe.  He states he has been using petroleum jelly strips on his wound.  He states he has neuropathy in his feet.   He states that he developed a blister on the 1st finger of the right hand a couple of weeks ago.  It moved distally down his finger and finally burst.  He is using neosporin and dressing on this.  He and his wife states it is improving.    The pt is  on a statin for  cholesterol management.    The pt is not on an aspirin.    Other AC:  Eliquis for afib The pt is on ACEI for hypertension.  The pt does have diabetes. Tobacco hx:  former    Past Medical History:  Diagnosis Date   Anemia    a. mild, noted 04/2017.   CAD in native artery    a. [name] 04/2017 s/p DES to D1, DES to prox-mid LAD, DES to prox LAD overlapping the prior stent, LVEF 55-65%.    Chronic diastolic CHF (congestive heart failure) (HCC)    Constipation    COPD (chronic obstructive pulmonary disease) (HCC)    Diabetic ulcer of toe (HCC)    DJD (degenerative joint disease) of cervical spine    Dysrhythmia    AFib   Essential hypertension    GERD (gastroesophageal reflux disease)    History of hiatal hernia    HIT (heparin-induced thrombocytopenia) ([place])    Hypothyroidism    Hypoxia    a. went home on home O2 04/2017.   Insomnia    Mixed hyperlipidemia    PAD (peripheral artery disease) (HCC)    PAF (paroxysmal atrial fibrillation) ([place])  PVD (peripheral vascular disease) (HCC)    Renal insufficiency    Retinal hemorrhage    lost 90% of vision.   Retinitis    Sinus bradycardia    a. HR 30s-40s in 04/2017 -> diltiazem stopped, metoprolol reduced.   Sleep apnea    "chose not to order CPAP at this time" (05/18/2017)   Type 2 diabetes mellitus ([place])    Wears glasses    Wheezing    a. suspected COPD 04/2017. Former tobacco x 40 years.    Past Surgical History:  Procedure Laterality Date   ABDOMINAL AORTOGRAM W/LOWER EXTREMITY N/A 09/15/2017   Procedure: ABDOMINAL AORTOGRAM W/LOWER EXTREMITY;  Surgeon: Serafina Mitchell, MD;  Location: [place] [facility];  Service: Cardiovascular;  Laterality: N/A;   ABDOMINAL AORTOGRAM W/LOWER EXTREMITY N/A 02/04/2020   Procedure: ABDOMINAL AORTOGRAM W/LOWER EXTREMITY;  Surgeon: Serafina Mitchell, MD;  Location: [place] [facility];  Service: Cardiovascular;  Laterality: N/A;   AMPUTATION Bilateral 09/20/2017   Procedure: BILATERAL GREAT TOE  AMPUTATIONS INCLUDING METATARSAL HEADS;  Surgeon: Serafina Mitchell, MD;  Location: [place];  Service: Vascular;  Laterality: Bilateral;   AMPUTATION Right 03/01/2019   Procedure: AMPUTATION TOES;  Surgeon: Angelia Mould, MD;  Location: [place];  Service: Vascular;  Laterality: Right;   AMPUTATION Right 05/29/2019   Procedure: AMPUTATION RIGHT THIRD TOE;  Surgeon: Serafina Mitchell, MD;  Location: [place];  Service: Vascular;  Laterality: Right;   AMPUTATION Right 09/26/2019   Procedure: AMPUTATION RIGHT FOURTH TOE AND FIFTH TOES;  Surgeon: Serafina Mitchell, MD;  Location: [place];  Service: Vascular;  Laterality: Right;   ANGIOPLASTY Right 09/20/2017   Procedure: ANGIOPLASTY RIGHT TIBIAL ARTERY;  Surgeon: Serafina Mitchell, MD;  Location: [place] [facility];  Service: Vascular;  Laterality: Right;   [place]; 2012;   CATARACT EXTRACTION Left 2004   COLONOSCOPY  2004   FLEISHMAN TICS   COLONOSCOPY WITH PROPOFOL N/A 11/28/2018   Procedure: COLONOSCOPY WITH PROPOFOL;  Surgeon: Rogene Houston, MD;  [facility];  Service: Endoscopy;  Laterality: N/A;   CORONARY ANGIOPLASTY WITH STENT PLACEMENT  05/18/2017   "3 stents"   CORONARY STENT INTERVENTION N/A 05/18/2017   Procedure: CORONARY STENT INTERVENTION;  Surgeon: Jettie Booze, MD;  Location: [place] [facility];  Service: Cardiovascular;  Laterality: N/A;   ESOPHAGOGASTRODUODENOSCOPY (EGD) WITH PROPOFOL N/A 11/20/2017   Procedure: ESOPHAGOGASTRODUODENOSCOPY (EGD) WITH PROPOFOL;  Surgeon: Irene Shipper, MD;  Location: [place];  Service: Gastroenterology;  Laterality: N/A;   ESOPHAGOGASTRODUODENOSCOPY (EGD) WITH PROPOFOL N/A 11/28/2018   Procedure: ESOPHAGOGASTRODUODENOSCOPY (EGD) WITH PROPOFOL;  Surgeon: Rogene Houston, MD;  [facility];  Service: Endoscopy;  Laterality: N/A;   ESOPHAGOGASTRODUODENOSCOPY (EGD) WITH PROPOFOL N/A 07/12/2019   Procedure: ESOPHAGOGASTRODUODENOSCOPY  (EGD) WITH PROPOFOL;  Surgeon: Rogene Houston, MD;  [facility];  Service: Endoscopy;  Laterality: N/A;  [place] N/A 12/17/2018   Procedure: GIVENS CAPSULE STUDY;  Surgeon: Rogene Houston, MD;  [facility];  Service: Endoscopy;  Laterality: N/A;  7:30am   I & D EXTREMITY Right 12/17/2017   Procedure: IRRIGATION AND DEBRIDEMENT RIGHT GREAT TOE;  Surgeon: Serafina Mitchell, MD;  [facility];  Service: Vascular;  Laterality: Right;   JOINT REPLACEMENT     LEFT HEART CATH AND CORONARY ANGIOGRAPHY N/A 05/18/2017   Procedure: LEFT HEART CATH  AND CORONARY ANGIOGRAPHY;  Surgeon: Jettie Booze, MD;  Location: [place] [facility];  Service: Cardiovascular;  Laterality: N/A;   LOWER EXTREMITY ANGIOGRAM Right 09/20/2017   Procedure: RIGHT LOWER LEG  ANGIOGRAM;  Surgeon: Serafina Mitchell, MD;  Location[facility];  Service: Vascular;  Laterality: Right;   LUMBAR FUSION  2002   L3, 4 L4, 5 L5 S1 Fused by Dr. Glenna Fellows   PERIPHERAL VASCULAR BALLOON ANGIOPLASTY Left 09/15/2017   Procedure: [place];  Surgeon: Serafina Mitchell, MD;  Location: [place] [facility];  Service: Cardiovascular;  Laterality: Left;  PTA of Peroneal & Posterior Tibial   POLYPECTOMY  11/28/2018   Procedure: POLYPECTOMY;  Surgeon: Rogene Houston, MD;  [facility];  Service: Endoscopy;;  duodenum   POSTERIOR LUMBAR FUSION     RHINOPLASTY     RIGHT HEART CATH N/A 05/18/2017   Procedure: RIGHT HEART CATH;  Surgeon: Jettie Booze, MD;  Location: [place] [facility];  Service: Cardiovascular;  Laterality: N/A;   TEE WITHOUT CARDIOVERSION N/A 09/18/2017   Procedure: TRANSESOPHAGEAL ECHOCARDIOGRAM (TEE);  Surgeon: Fay Records, MD;  Location: [place];  Service: Cardiovascular;  Laterality: N/A;   TOTAL KNEE ARTHROPLASTY Bilateral    TRANSMETATARSAL AMPUTATION Right 02/04/2020   Procedure: RIGHT TRANSMETATARSAL AMPUTATION;  Surgeon: Serafina Mitchell, MD;  Location: [place];  Service: Vascular;  Laterality: Right;   TRANSURETHRAL RESECTION OF PROSTATE  2001   [place]    Allergies  Allergen Reactions   Bactrim [Sulfamethoxazole-Trimethoprim] Other (See Comments)    Pancytopenia   Codeine Shortness Of Breath   Doxycycline Swelling    Swelling and numbness in lips and face. Swelling improved after stopping. Reports still experiences numbness in bottom lip.    Feraheme [Ferumoxytol] Other (See Comments)    Diaphoretic, chest pain   Heparin Other (See Comments)    +HIT,  Severe bleeding (with heparin drip & large doses), tolerates low doses   Losartan Swelling   Oxycodone Other (See Comments)    "Made me act out of my mind" Mental status changes- hallucinations   Latex Rash    Current Outpatient Medications  Medication Sig Dispense Refill   Accu-Chek Softclix Lancets lancets      acetaminophen (TYLENOL) 500 MG tablet Take 1 tablet (500 mg total) by mouth every 6 (six) hours as needed for mild pain or headache. 30 tablet 0   Alcohol Swabs (B-D SINGLE USE SWABS REGULAR) PADS      atorvastatin (LIPITOR) 80 MG tablet Take 80 mg by mouth every evening.      Blood Glucose Monitoring Suppl (ACCU-CHEK GUIDE) w/Device KIT      Cholecalciferol (VITAMIN D3) 50 MCG (2000 UT) TABS Take 2,000 Units by mouth daily.     Cyanocobalamin (B-12) 2500 MCG TABS Take 2,500 mcg by mouth daily.     DROPLET INSULIN SYRINGE 31G X 5/16" 1 ML MISC      ELIQUIS 5 MG TABS tablet TAKE 1 TABLET TWICE DAILY 053 tablet 3   folic acid (FOLVITE) 1 MG tablet Take 1 tablet (1 mg total) by mouth daily. 30 tablet 1   furosemide (LASIX) 80 MG tablet Take 80 mg by mouth See admin instructions. Take 80 mg by mouth in the morning and between 5-6 PM daily     insulin glargine (LANTUS) 100 UNIT/ML injection Inject 30-40 Units into the skin See admin instructions. Inject 30 units into the skin in the morning and 40  units at bedtime     ipratropium-albuterol (DUONEB) 0.5-2.5  (3) MG/3ML SOLN Take 3 mLs by nebulization every evening.      isosorbide mononitrate (IMDUR) 30 MG 24 hr tablet Take 1.5 tablets (45 mg total) by mouth in the morning and at bedtime. 270 tablet 3   linezolid (ZYVOX) 600 MG tablet TAKE 1 TABLET (600 MG TOTAL) BY MOUTH EVERY TWELVE HOURS FOR 10 DAYS. 20 tablet 0   lisinopril (ZESTRIL) 5 MG tablet Take 0.5 tablets (2.5 mg total) by mouth daily.     meclizine (ANTIVERT) 25 MG tablet Take 25 mg by mouth daily as needed for dizziness.     metFORMIN (GLUCOPHAGE) 1000 MG tablet Take 1,000 mg by mouth 2 (two) times daily.     nitroGLYCERIN (NITROSTAT) 0.4 MG SL tablet PLACE 1 TABLET (0.4 MG TOTAL) UNDER THE TONGUE EVERY 5  MINUTES AS NEEDED FOR CHEST PAIN. 25 tablet 3   OXYGEN Inhale 2 L/min into the lungs See admin instructions. 2 L/min of oxygen at bedtime and during the day as needed for shortness of breath     pantoprazole (PROTONIX) 40 MG tablet Take 1 tablet (40 mg total) by mouth 2 (two) times daily. To protect stomach while taking multiple blood thinners. 60 tablet 2   potassium chloride SA (K-DUR) 20 MEQ tablet Take 1 tablet (20 mEq total) by mouth daily. 30 tablet 1   psyllium (METAMUCIL) 58.6 % packet Take 0.5 packets by mouth 2 (two) times daily. MIX AND DRINK     SPIRIVA HANDIHALER 18 MCG inhalation capsule Place 1 capsule into inhaler and inhale daily after breakfast.      traMADol (ULTRAM) 50 MG tablet Take 1 tablet (50 mg total) by mouth every 8 (eight) hours as needed. 20 tablet 0   traZODone (DESYREL) 100 MG tablet Take 100-200 mg by mouth at bedtime.      TRUE METRIX BLOOD GLUCOSE TEST test strip      No current facility-administered medications for this visit.    Family History  Problem Relation Age of Onset   Heart attack Father 24   COPD Father    COPD Mother    Heart disease Mother    Diabetes Mother    Hypertension Sister    CVA Sister    Diabetes Sister    Multiple sclerosis Sister     Social History   Socioeconomic  History   Marital status: Married    Spouse name: Not on file   Number of children: Not on file   Years of education: Not on file   Highest education level: Not on file  Occupational History   Not on file  Tobacco Use   Smoking status: Former    Packs/day: 2.00    Years: 30.00    Pack years: 60.00    Types: Cigarettes    Start date: 03/28/1953    Quit date: 03/29/1983    Years since quitting: 37.6   Smokeless tobacco: Never  Vaping Use   Vaping Use: Never used  Substance and Sexual Activity   Alcohol use: Not Currently    Comment: 05/18/2017 'I drink a beer q yr"   Drug use: No   Sexual activity: Not Currently  Other Topics Concern   Not on file  Social History Narrative   Patient lives at home with his spouse.    Social Determinants of Health   Financial Resource Strain: Not on file  Food Insecurity: No Food Insecurity   Worried About  Running Out of Food in the Last Year: Never true   Ran Out of Food in the Last Year: Never true  Transportation Needs: No Transportation Needs   Lack of Transportation (Medical): No   Lack of Transportation (Non-Medical): No  Physical Activity: Not on file  Stress: Not on file  Social Connections: Not on file  Intimate Partner Violence: Not on file     REVIEW OF SYSTEMS:   _0  denotes positive finding, _1  denotes negative finding Cardiac  Comments:  Chest pain or chest pressure:    Shortness of breath upon exertion:    Short of breath when lying flat:    Irregular heart rhythm:        Vascular    Pain in calf, thigh, or hip brought on by ambulation:    Pain in feet at night that wakes you up from your sleep:     Blood clot in your veins:    Leg swelling:         Pulmonary    Oxygen at home:    Productive cough:     Wheezing:         Neurologic    Sudden weakness in arms or legs:     Sudden numbness in arms or legs:     Sudden onset of difficulty speaking or slurred speech:    Temporary loss of vision in one eye:      Problems with dizziness:         Gastrointestinal    Blood in stool:     Vomited blood:         Genitourinary    Burning when urinating:     Blood in urine:        Psychiatric    Major depression:         Hematologic    Bleeding problems:    Problems with blood clotting too easily:        Skin    Rashes or ulcers: x       Constitutional    Fever or chills:      PHYSICAL EXAMINATION:  Today's Vitals   11/02/20 1420  BP: 139/73  Pulse: 60  Resp: 16  Temp: (!) 97.4 F (36.3 C)  TempSrc: Temporal  Weight: 182 lb (82.6 kg)  Height: _2  (1.702 m)  PainSc: 6    Body mass index is 28.51 kg/m.   General:  WDWN in NAD; vital signs documented above Gait: Not observed HENT: WNL, normocephalic Pulmonary: normal non-labored breathing , without wheezing Cardiac: regular HR, with Murmur; without carotid bruits Abdomen: soft, NT, no masses; aortic pulse is not palpable Skin: without rashes Vascular Exam/Pulses:  Right Left  Radial 2+ (normal) 2+ (normal)  Ulnar Brisk ulnar doppler signal Not palpated  Popliteal Unable to palpate Unable to palpate  DP monophasic monophasic  PT monophasic Brisk biphasic  Peroneal Brisk monophasic monophasic   Extremities: right TMA incision healed with exception of wound on lateral aspect of foot.    Musculoskeletal: no muscle wasting or atrophy  Neurologic: A&O X 3;  No focal weakness or paresthesias are detected Psychiatric:  The pt has Normal affect.   Non-Invasive Vascular Imaging:   ABI's/TBI's on 11/02/2020: Right:  1.02 - Great toe pressure: amputation Left:  1.16 - Great toe pressure: amputation  Arterial duplex on 11/02/2020: +-----------+--------+-----+---------------+----------+--------+  RIGHT      PSV cm/sRatioStenosis       Waveform  Comments  +-----------+--------+-----+---------------+----------+--------+  CFA Prox  132                         triphasic            +-----------+--------+-----+---------------+----------+--------+  DFA        149                         triphasic           +-----------+--------+-----+---------------+----------+--------+  SFA Prox   79           30-49% stenosisbiphasic            +-----------+--------+-----+---------------+----------+--------+  SFA Mid    112                         biphasic            +-----------+--------+-----+---------------+----------+--------+  SFA Distal 174                         biphasic            +-----------+--------+-----+---------------+----------+--------+  POP Prox   58                          biphasic            +-----------+--------+-----+---------------+----------+--------+  POP Distal 51                          biphasic            +-----------+--------+-----+---------------+----------+--------+  TP Trunk   59                          biphasic            +-----------+--------+-----+---------------+----------+--------+  PTA Prox   321          50-74% stenosisstenotic            +-----------+--------+-----+---------------+----------+--------+  PTA Mid    111                         monophasic          +-----------+--------+-----+---------------+----------+--------+  PTA Distal 113                         monophasic          +-----------+--------+-----+---------------+----------+--------+  PERO Prox  88                          biphasic            +-----------+--------+-----+---------------+----------+--------+  PERO Mid   73                          monophasic          +-----------+--------+-----+---------------+----------+--------+  PERO Distal                            monophasic          +-----------+--------+-----+---------------+----------+--------+   Previous ABI's/TBI's on 06/15/2020: Right:  1.13/n/a - Great toe pressure: amputation  Left:  1.13/0.50 - Great toe pressure:   54  Previous arterial duplex on 06/15/2020: +-----------+---------+-----+---------------+----------+--------+  RIGHT  PSV cm/s RatioStenosis       Waveform  Comments  +-----------+---------+-----+---------------+----------+--------+  CFA Prox   108                          biphasic            +-----------+---------+-----+---------------+----------+--------+  DFA        104 / 194                    biphasic            +-----------+---------+-----+---------------+----------+--------+  SFA Prox   82 / 307      50-74% stenosisbiphasic            +-----------+---------+-----+---------------+----------+--------+  SFA Mid    209           50-74% stenosis                    +-----------+---------+-----+---------------+----------+--------+  SFA Distal 62                           biphasic            +-----------+---------+-----+---------------+----------+--------+  POP Prox   50                           biphasic            +-----------+---------+-----+---------------+----------+--------+  POP Mid    57                           biphasic            +-----------+---------+-----+---------------+----------+--------+  POP Distal 58                           monophasicbrisk     +-----------+---------+-----+---------------+----------+--------+  ATA Distal 19                           monophasic          +-----------+---------+-----+---------------+----------+--------+  PTA Distal 88                           monophasic          +-----------+---------+-----+---------------+----------+--------+  PERO Distal                                       nv        +-----------+---------+-----+---------------+----------+--------+   Summary:  Right: 50-74% stenosis noted in the superficial femoral artery. Tibial  artery occlusive disease (known peroneal artery occclusion).   ASSESSMENT/PLAN:: 87 y.o. male here for follow up  for right TMA in November 2021 by Dr. Trula Slade that has been slow to heal  -pt continues to have slow healing wound on lateral aspect of right TMA.  Dr. Trula Slade examined pt and reviewed previous arteriogram and arterial duplex from today.  He does not feel he is a candidate for any intervention at this time.  Silver nitrate used on ulcer today.  Prefer to keep this dry and therefore he will stop using the petroleum jelly strips.  Discussed with pt to continue to protect his foot.  Dr. Trula Slade ok with him  wearing loose fitting shoe as long as it is not rubbing on the non healing area.   Referral to [facility] for shoe spacer and foot drop brace/splint.   -his ulcer on the first right finger is healing -continue neosporin and dressing daily.   -pt will f/u in 6 weeks for wound check.  They will call sooner if there are any issues.    Leontine Locket, [facility] [phone]  Clinic MD:   Trula Slade

## 2020-11-17 ENCOUNTER — Other Ambulatory Visit: Payer: Self-pay

## 2020-11-17 NOTE — Patient Outreach (Signed)
[facility]) Care Management  11/17/2020  Mike Wright Sep 22, 1933 [id number]  Referral for medication assistance from Jon Billings, RN sent to [facility] Management Assistant [phone]

## 2020-11-17 NOTE — Patient Outreach (Signed)
[facility]) Care Management  [place]  11/17/2020   Mike Wright [birth year] [id number]  Subjective: Telephone call to patient for follow up disease management. Patient doing good. Weight 179 lbs.  Reiterated heart failure management. No concerns.   Objective:   Encounter Medications:  Outpatient Encounter Medications as of 11/17/2020  Medication Sig Note   Accu-Chek Softclix Lancets lancets     acetaminophen (TYLENOL) 500 MG tablet Take 1 tablet (500 mg total) by mouth every 6 (six) hours as needed for mild pain or headache.    Alcohol Swabs (B-D SINGLE USE SWABS REGULAR) PADS     atorvastatin (LIPITOR) 80 MG tablet Take 80 mg by mouth every evening.     Blood Glucose Monitoring Suppl (ACCU-CHEK GUIDE) w/Device KIT     Cholecalciferol (VITAMIN D3) 50 MCG (2000 UT) TABS Take 2,000 Units by mouth daily.    Cyanocobalamin (B-12) 2500 MCG TABS Take 2,500 mcg by mouth daily.    DROPLET INSULIN SYRINGE 31G X 5/16" 1 ML MISC     ELIQUIS 5 MG TABS tablet TAKE 1 TABLET TWICE DAILY    folic acid (FOLVITE) 1 MG tablet Take 1 tablet (1 mg total) by mouth daily.    furosemide (LASIX) 80 MG tablet Take 80 mg by mouth See admin instructions. Take 80 mg by mouth in the morning and between 5-6 PM daily    insulin glargine (LANTUS) 100 UNIT/ML injection Inject 30-40 Units into the skin See admin instructions. Inject 30 units into the skin in the morning and 40 units at bedtime 02/02/2020: Regimen confirmed to be accurate by the patient   ipratropium-albuterol (DUONEB) 0.5-2.5 (3) MG/3ML SOLN Take 3 mLs by nebulization every evening.     isosorbide mononitrate (IMDUR) 30 MG 24 hr tablet Take 1.5 tablets (45 mg total) by mouth in the morning and at bedtime.    linezolid (ZYVOX) 600 MG tablet TAKE 1 TABLET (600 MG TOTAL) BY MOUTH EVERY TWELVE HOURS FOR 10 DAYS. (Patient not taking: Reported on 11/02/2020)    lisinopril (ZESTRIL) 5 MG tablet Take 0.5 tablets (2.5 mg total) by  mouth daily.    meclizine (ANTIVERT) 25 MG tablet Take 25 mg by mouth daily as needed for dizziness.    metFORMIN (GLUCOPHAGE) 1000 MG tablet Take 1,000 mg by mouth 2 (two) times daily.    nitroGLYCERIN (NITROSTAT) 0.4 MG SL tablet PLACE 1 TABLET (0.4 MG TOTAL) UNDER THE TONGUE EVERY 5  MINUTES AS NEEDED FOR CHEST PAIN.    OXYGEN Inhale 2 L/min into the lungs See admin instructions. 2 L/min of oxygen at bedtime and during the day as needed for shortness of breath    pantoprazole (PROTONIX) 40 MG tablet Take 1 tablet (40 mg total) by mouth 2 (two) times daily. To protect stomach while taking multiple blood thinners.    potassium chloride SA (K-DUR) 20 MEQ tablet Take 1 tablet (20 mEq total) by mouth daily.    psyllium (METAMUCIL) 58.6 % packet Take 0.5 packets by mouth 2 (two) times daily. MIX AND DRINK    SPIRIVA HANDIHALER 18 MCG inhalation capsule Place 1 capsule into inhaler and inhale daily after breakfast.     traMADol (ULTRAM) 50 MG tablet Take 1 tablet (50 mg total) by mouth every 8 (eight) hours as needed.    traZODone (DESYREL) 100 MG tablet Take 100-200 mg by mouth at bedtime.     TRUE METRIX BLOOD GLUCOSE TEST test strip     No facility-administered encounter  medications on file as of 11/17/2020.    Functional Status:  In your present state of health, do you have any difficulty performing the following activities: 10/08/2020 09/11/2020  Hearing? N N  Vision? N N  Difficulty concentrating or making decisions? N N  Walking or climbing stairs? Y Y  Comment weakness and amputation weakness and amputation  Dressing or bathing? N N  Doing errands, shopping? N N  Preparing Food and eating ? N N  Using the Toilet? N N  In the past six months, have you accidently leaked urine? [name]  Comment wears depends wears depends  Do you have problems with loss of bowel control? N N  Managing your Medications? N N  Managing your Finances? N N  Housekeeping or managing your Housekeeping? [name]   Comment wife assists wife assists  Some recent data might be hidden    Fall/Depression Screening: Fall Risk  10/08/2020 03/10/2020 02/10/2020  Falls in the past year? _0 Comment - - -  Number falls in past yr: 1 1 0  Injury with Fall? 0 0 0  Risk for fall due to : Orthopedic patient;Impaired balance/gait Orthopedic patient -  Follow up Falls prevention discussed Falls prevention discussed -   PHQ 2/9 Scores 10/08/2020 02/10/2020 11/07/2019 07/29/2019 06/12/2019 12/20/2018  PHQ - 2 Score 0 0 0 0 0 0    Assessment:   Care Plan Care Plan : Heart Failure (Adult)  Updates made by Jon Billings, RN since 11/17/2020 12:00 AM     Problem: Symptom Exacerbation (Heart Failure)      Long-Range Goal: Symptom Exacerbation Prevented or Minimized as evidenced by patient continuing to monitor weights regularly.   Start Date: 02/10/2020  Expected End Date: 03/27/2021  This Visit's Progress: On track  Recent Progress: On track  Priority: High  Note:   Evidence-based guidance: Establish a mutually-agreed-upon early intervention process to communicate with primary care provider when signs/symptoms worsen.  Notes: 06/29/20   Patient weighs daily and is aware of worsening symptoms.   10/08/20 Patient continues to weight daily. Weight 184 lbs and trending upward.  Discussed continuing to monitor weights and limit salt intake due to trending upward.   11/17/20 Patient weighing daily. Today's weight 179 lbs. Reiterated heart failure management.      Task: Identify and Minimize Risk of Heart Failure Exacerbation   Due Date: 03/27/2021  Priority: Routine  Responsible User: Jon Billings, RN  Note:   Care Management Activities:    - rescue (action) plan reviewed - self-awareness of signs/symptoms of worsening disease encouraged    Notes: 05-01-20 Patient knows signs of heart failure and when to notify physician.  06/29/20 Reiterated signs of heart failure and importance of weights.   10/08/20 Patient  continues to weight daily. Weight 184 lbs and trending upward.  Discussed continuing to monitor weights and limit salt intake due to trending upward.   Heart Failure Management Discussed Please weight daily or as ordered by your doctor Report to your doctor weight gain of 2 -3 pounds in a day or 5 pounds in a week Limit salt intake Monitor for shortness of breath, swelling of feet, ankles or abdomen and weight gain. Never use the saltshaker.  Read all food labels and avoid canned, processed, and pickled foods.   Follow your doctor's recommendations for daily salt intake. 11/17/20 Patient weighing daily. Today's weight 179 lbs. Reiterated heart failure management.        Goals Addressed  This Visit's Progress     THN-Monitor foot wound for infection (pt-stated)   On track     Barriers: Knowledge Timeframe:  Short-Term Goal Priority:  High Start Date:   03/24/20                          Expected End Date:   12/25/20        Follow Up Date 12/25/20   - clean and dry skin well         Why is this important?   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   Taking really good care of your skin will help to keep your skin unbroken.    Notes: Patient denies signs of infection.  Wet to dry dressings daily. Wound closing up slowly.  Home health nurse 2/week. 04/15/20-patient with recent debridement of right foot surgical site.  Area now red with some bleeding to site per patient.  Santyl dressing daily. 05/02/20 Patient reports wound is doing good.  He did have a bleeding episode that was stopped with pressure to site. Home health continues 2/wk visits.   06/29/20 Wound almost healed.  Discussed continued use of boot to relieve pressure. 09/11/20 Wound almost healed.  Patient careful not to wear anything on foot that causes pressure. Wound Management Discussed Take antibiotics as prescribed Monitor for swelling, redness, pain, pus, and fever Keep wound clean and  dry.   Notify physician immediately for any changes   10/08/20 Patient reports only a small area open now and just using a Band-Aid to cover.  Discussed wearing shoes and avoid wearing shoes that would apply pressure to area.   Wound Management Reiterated Monitor for swelling, redness, pain, pus, and fever Keep wound clean and dry.   Notify physician immediately for any changes  11/17/20 Patient still has only a small area open on right foot. Reviewed importance of wound monitoring for infection or worsening.          THN-Track and Manage Symptoms   On track     Barriers: Health Behaviors Knowledge Timeframe:  Long-Range Goal Priority:  High Start Date:  03/24/20                           Expected End Date: 03/27/21  Follow up 12/25/20  - follow rescue plan if symptoms flare-up - know when to call the doctor    Why is this important?   You will be able to handle your symptoms better if you keep track of them.  Making some simple changes to your lifestyle will help.  Eating healthy is one thing you can do to take good care of yourself.    Notes: 04/15/20 Weight 177 lbs.  05-02-20 Patient weight 179 lbs.   06/29/20 Patient weight 180 lbs. Keep up the great work! 09/11/20 Weight 182 lbs.  Continue weighing. Heart Failure Management Discussed Please weight daily or as ordered by your doctor Report to your doctor weight gain of 2 -3 pounds in a day or 5 pounds in a week Limit salt intake Monitor for shortness of breath, swelling of feet, ankles or abdomen and weight gain  10/08/20 Patient continues to weight daily. Weight 184 lbs and trending upward.  Discussed continuing to monitor weights and limit salt intake due to trending upward.   Heart Failure Management Reiterated Please weight daily or as ordered by your doctor Report to  your doctor weight gain of 2 -3 pounds in a day or 5 pounds in a week Limit salt intake Monitor for shortness of breath, swelling of feet, ankles or abdomen  and weight gain. Never use the saltshaker.  Read all food labels and avoid canned, processed, and pickled foods.   Follow your doctor's recommendations for daily salt intake. 11/17/20 Patient reports weight 179 lbs. Reviewed heart failure management and importance.  Patient continues to weigh daily.         Plan:  Follow-up: Patient agrees to Care Plan and Follow-up. Follow-up in 4-6 week(s)  [name], RN, MSN [place] [facility] Care Management Coordinator Direct Line [phone] Cell [phone] Toll Free: [phone]  Fax: [phone]

## 2020-11-19 ENCOUNTER — Ambulatory Visit: Payer: Self-pay

## 2020-11-21 DIAGNOSIS — I509 Heart failure, unspecified: Secondary | ICD-10-CM | POA: Diagnosis not present

## 2020-11-23 ENCOUNTER — Ambulatory Visit: Payer: Medicare HMO | Admitting: Cardiology

## 2020-11-24 DIAGNOSIS — Z89412 Acquired absence of left great toe: Secondary | ICD-10-CM | POA: Diagnosis not present

## 2020-11-24 DIAGNOSIS — Z4781 Encounter for orthopedic aftercare following surgical amputation: Secondary | ICD-10-CM | POA: Diagnosis not present

## 2020-11-24 DIAGNOSIS — Z89431 Acquired absence of right foot: Secondary | ICD-10-CM | POA: Diagnosis not present

## 2020-11-25 DIAGNOSIS — E7800 Pure hypercholesterolemia, unspecified: Secondary | ICD-10-CM | POA: Diagnosis not present

## 2020-11-25 DIAGNOSIS — I10 Essential (primary) hypertension: Secondary | ICD-10-CM | POA: Diagnosis not present

## 2020-12-08 DIAGNOSIS — Z299 Encounter for prophylactic measures, unspecified: Secondary | ICD-10-CM | POA: Diagnosis not present

## 2020-12-08 DIAGNOSIS — L03119 Cellulitis of unspecified part of limb: Secondary | ICD-10-CM | POA: Diagnosis not present

## 2020-12-08 DIAGNOSIS — E11621 Type 2 diabetes mellitus with foot ulcer: Secondary | ICD-10-CM | POA: Diagnosis not present

## 2020-12-08 DIAGNOSIS — D649 Anemia, unspecified: Secondary | ICD-10-CM | POA: Diagnosis not present

## 2020-12-08 DIAGNOSIS — L97509 Non-pressure chronic ulcer of other part of unspecified foot with unspecified severity: Secondary | ICD-10-CM | POA: Diagnosis not present

## 2020-12-08 DIAGNOSIS — E1165 Type 2 diabetes mellitus with hyperglycemia: Secondary | ICD-10-CM | POA: Diagnosis not present

## 2020-12-10 ENCOUNTER — Other Ambulatory Visit: Payer: Self-pay

## 2020-12-10 ENCOUNTER — Ambulatory Visit (INDEPENDENT_AMBULATORY_CARE_PROVIDER_SITE_OTHER): Payer: Medicare HMO | Admitting: Physician Assistant

## 2020-12-10 ENCOUNTER — Telehealth: Payer: Self-pay

## 2020-12-10 VITALS — BP 105/59 | HR 85 | Temp 97.2°F | Resp 16 | Ht 67.0 in | Wt 183.0 lb

## 2020-12-10 DIAGNOSIS — I739 Peripheral vascular disease, unspecified: Secondary | ICD-10-CM

## 2020-12-10 DIAGNOSIS — I7025 Atherosclerosis of native arteries of other extremities with ulceration: Secondary | ICD-10-CM

## 2020-12-10 NOTE — H&P (View-Only) (Signed)
Office Note     CC:  follow up Requesting Provider:  Shah, Ashish, MD  HPI: Mike Wright is a 87 y.o. (10/14/1933) male who presents for evaluation of bleeding ulceration on right foot.  He underwent right TMA by Dr. Brabham in November 2021 which has been slow to heal.  Prior to surgery he had a diagnostic arteriogram which demonstrated diffuse tibial disease however but no treatable flow-limiting stenosis at the time.  The patient and his wife state they had a blister appearing on the dorsal foot in the past few weeks which has slowly worsened.  He presented to his primary doctor yesterday after the blister burst and he encountered bleeding from the wound bed.  He also has redness ascending up his ankle.  He was started on Bactrim and referred back to our office today.  The patient denies any pain to his foot.  He also denies any chest pain, fevers, chills, nausea/vomiting.  He states he is at baseline always somewhat short of breath due to atrial fibrillation.  He is on Eliquis for atrial fibrillation.  Past medical history also significant for diabetes mellitus.   Past Medical History:  Diagnosis Date   Anemia    a. mild, noted 04/2017.   CAD in native artery    a. [place] 04/2017 s/p DES to D1, DES to prox-mid LAD, DES to prox LAD overlapping the prior stent, LVEF 55-65%.    Chronic diastolic CHF (congestive heart failure) (HCC)    Constipation    COPD (chronic obstructive pulmonary disease) (HCC)    Diabetic ulcer of toe (HCC)    DJD (degenerative joint disease) of cervical spine    Dysrhythmia    AFib   Essential hypertension    GERD (gastroesophageal reflux disease)    History of hiatal hernia    HIT (heparin-induced thrombocytopenia) (HCC)    Hypothyroidism    Hypoxia    a. went home on home O2 04/2017.   Insomnia    Mixed hyperlipidemia    PAD (peripheral artery disease) (HCC)    PAF (paroxysmal atrial fibrillation) (HCC)    PVD (peripheral vascular disease) (HCC)    Renal  insufficiency    Retinal hemorrhage    lost 90% of vision.   Retinitis    Sinus bradycardia    a. HR 30s-40s in 04/2017 -> diltiazem stopped, metoprolol reduced.   Sleep apnea    "chose not to order CPAP at this time" (05/18/2017)   Type 2 diabetes mellitus (HCC)    Wears glasses    Wheezing    a. suspected COPD 04/2017. Former tobacco x 40 years.    Past Surgical History:  Procedure Laterality Date   ABDOMINAL AORTOGRAM W/LOWER EXTREMITY N/A 09/15/2017   Procedure: ABDOMINAL AORTOGRAM W/LOWER EXTREMITY;  Surgeon: Brabham, Vance W, MD;  Location: [facility];  Service: Cardiovascular;  Laterality: N/A;   ABDOMINAL AORTOGRAM W/LOWER EXTREMITY N/A 02/04/2020   Procedure: ABDOMINAL AORTOGRAM W/LOWER EXTREMITY;  Surgeon: Brabham, Vance W, MD;  Location: [facility];  Service: Cardiovascular;  Laterality: N/A;   AMPUTATION Bilateral 09/20/2017   Procedure: BILATERAL GREAT TOE AMPUTATIONS INCLUDING METATARSAL HEADS;  Surgeon: Brabham, Vance W, MD;  Location: [facility];  Service: Vascular;  Laterality: Bilateral;   AMPUTATION Right 03/01/2019   Procedure: AMPUTATION TOES;  Surgeon: Dickson, Christopher S, MD;  Location: [facility];  Service: Vascular;  Laterality: Right;   AMPUTATION Right 05/29/2019   Procedure: AMPUTATION RIGHT THIRD TOE;  Surgeon: Brabham, Vance   W, MD;  Location: [facility];  Service: Vascular;  Laterality: Right;   AMPUTATION Right 09/26/2019   Procedure: AMPUTATION RIGHT FOURTH TOE AND FIFTH TOES;  Surgeon: Brabham, Vance W, MD;  [facility]: [facility];  Service: Vascular;  Laterality: Right;   ANGIOPLASTY Right 09/20/2017   Procedure: ANGIOPLASTY RIGHT TIBIAL ARTERY;  Surgeon: Brabham, Vance W, MD;  Location: [facility];  Service: Vascular;  Laterality: Right;   APPENDECTOMY     BACK SURGERY     CARDIAC CATHETERIZATION  1980s; 2012;   CATARACT EXTRACTION Left 2004   COLONOSCOPY  2004   FLEISHMAN TICS   COLONOSCOPY WITH PROPOFOL N/A 11/28/2018   Procedure: COLONOSCOPY WITH PROPOFOL;   Surgeon: Rehman, Najeeb U, MD;  [facility]: [facility];  Service: Endoscopy;  Laterality: N/A;   CORONARY ANGIOPLASTY WITH STENT PLACEMENT  05/18/2017   "3 stents"   CORONARY STENT INTERVENTION N/A 05/18/2017   Procedure: CORONARY STENT INTERVENTION;  Surgeon: Varanasi, Jayadeep S, MD;  [facility]: [facility];  Service: Cardiovascular;  Laterality: N/A;   ESOPHAGOGASTRODUODENOSCOPY (EGD) WITH PROPOFOL N/A 11/20/2017   Procedure: ESOPHAGOGASTRODUODENOSCOPY (EGD) WITH PROPOFOL;  Surgeon: Perry, John N, MD;  [facility];  Service: Gastroenterology;  Laterality: N/A;   ESOPHAGOGASTRODUODENOSCOPY (EGD) WITH PROPOFOL N/A 11/28/2018   Procedure: ESOPHAGOGASTRODUODENOSCOPY (EGD) WITH PROPOFOL;  Surgeon: Rehman, Najeeb U, MD;  [facility];  Service: Endoscopy;  Laterality: N/A;   ESOPHAGOGASTRODUODENOSCOPY (EGD) WITH PROPOFOL N/A 07/12/2019   Procedure: ESOPHAGOGASTRODUODENOSCOPY (EGD) WITH PROPOFOL;  Surgeon: Rehman, Najeeb U, MD;  [facility];  Service: Endoscopy;  Laterality: N/A;  125   GIVENS CAPSULE STUDY N/A 12/17/2018   Procedure: GIVENS CAPSULE STUDY;  Surgeon: Rehman, Najeeb U, MD;  [facility];  Service: Endoscopy;  Laterality: N/A;  7:30am   I & D EXTREMITY Right 12/17/2017   Procedure: IRRIGATION AND DEBRIDEMENT RIGHT GREAT TOE;  Surgeon: Brabham, Vance W, MD;  [facility];  Service: Vascular;  Laterality: Right;   JOINT REPLACEMENT     LEFT HEART CATH AND CORONARY ANGIOGRAPHY N/A 05/18/2017   Procedure: LEFT HEART CATH AND CORONARY ANGIOGRAPHY;  Surgeon: Varanasi, Jayadeep S, MD;  [facility];  Service: Cardiovascular;  Laterality: N/A;   LOWER EXTREMITY ANGIOGRAM Right 09/20/2017   Procedure: RIGHT LOWER LEG  ANGIOGRAM;  Surgeon: Brabham, Vance W, MD;  [facility];  Service: Vascular;  Laterality: Right;   LUMBAR FUSION  2002   L3, 4 L4, 5 L5 S1 Fused by Dr. Mark Roy   PERIPHERAL VASCULAR BALLOON ANGIOPLASTY Left  09/15/2017   Procedure: PERIPHERAL VASCULAR BALLOON ANGIOPLASTY;  Surgeon: Brabham, Vance W, MD;  [facility];  Service: Cardiovascular;  Laterality: Left;  PTA of Peroneal & Posterior Tibial   POLYPECTOMY  11/28/2018   Procedure: POLYPECTOMY;  Surgeon: Rehman, Najeeb U, MD;  [facility];  Service: Endoscopy;;  duodenum   POSTERIOR LUMBAR FUSION     RHINOPLASTY     RIGHT HEART CATH N/A 05/18/2017   Procedure: RIGHT HEART CATH;  Surgeon: Varanasi, Jayadeep S, MD;  [facility];  Service: Cardiovascular;  Laterality: N/A;   TEE WITHOUT CARDIOVERSION N/A 09/18/2017   Procedure: TRANSESOPHAGEAL ECHOCARDIOGRAM (TEE);  Surgeon: Ross, Paula V, MD;  [facility];  Service: Cardiovascular;  Laterality: N/A;   TOTAL KNEE ARTHROPLASTY Bilateral    TRANSMETATARSAL AMPUTATION Right 02/04/2020   Procedure: RIGHT TRANSMETATARSAL AMPUTATION;  Surgeon: Brabham, Vance W, MD;  [facility];  Service: Vascular;  Laterality: Right;   TRANSURETHRAL RESECTION OF PROSTATE  2001   Krishnan    Social History   Socioeconomic History   Marital status: Married    Spouse name: Not on file   Number of children: Not on file   Years of education: Not on file   Highest education level: Not on file  Occupational History   Not on file  Tobacco Use   Smoking status: Former    Packs/day: 2.00    Years: 30.00    Pack years: 60.00    Types: Cigarettes    Start date: 03/28/1953    Quit date: 03/29/1983    Years since quitting: 37.7   Smokeless tobacco: Never  Vaping Use   Vaping Use: Never used  Substance and Sexual Activity   Alcohol use: Not Currently    Comment: 05/18/2017 'I drink a beer q yr"   Drug use: No   Sexual activity: Not Currently  Other Topics Concern   Not on file  Social History Narrative   Patient lives at home with his spouse.    Social Determinants of Health   Financial Resource Strain: Not on file  Food Insecurity: No Food Insecurity    Worried About Charity fundraiser in the Last Year: Never true   Ran Out of Food in the Last Year: Never true  Transportation Needs: No Transportation Needs   Lack of Transportation (Medical): No   Lack of Transportation (Non-Medical): No  Physical Activity: Not on file  Stress: Not on file  Social Connections: Not on file  Intimate Partner Violence: Not on file    Family History  Problem Relation Age of Onset   Heart attack Father 16   COPD Father    COPD Mother    Heart disease Mother    Diabetes Mother    Hypertension Sister    CVA Sister    Diabetes Sister    Multiple sclerosis Sister     Current Outpatient Medications  Medication Sig Dispense Refill   Accu-Chek Softclix Lancets lancets      acetaminophen (TYLENOL) 500 MG tablet Take 1 tablet (500 mg total) by mouth every 6 (six) hours as needed for mild pain or headache. 30 tablet 0   Alcohol Swabs (B-D SINGLE USE SWABS REGULAR) PADS      atorvastatin (LIPITOR) 80 MG tablet Take 80 mg by mouth every evening.      Blood Glucose Monitoring Suppl (ACCU-CHEK GUIDE) w/Device KIT      Cholecalciferol (VITAMIN D3) 50 MCG (2000 UT) TABS Take 2,000 Units by mouth daily.     Cyanocobalamin (B-12) 2500 MCG TABS Take 2,500 mcg by mouth daily.     DROPLET INSULIN SYRINGE 31G X 5/16" 1 ML MISC      ELIQUIS 5 MG TABS tablet TAKE 1 TABLET TWICE DAILY 353 tablet 3   folic acid (FOLVITE) 1 MG tablet Take 1 tablet (1 mg total) by mouth daily. 30 tablet 1   furosemide (LASIX) 80 MG tablet Take 80 mg by mouth See admin instructions. Take 80 mg by mouth in the morning and between 5-6 PM daily     insulin glargine (LANTUS) 100 UNIT/ML injection Inject 30-40 Units into the skin See admin instructions. Inject 30 units into the skin in the morning and 40 units at bedtime     ipratropium-albuterol (DUONEB) 0.5-2.5 (3) MG/3ML SOLN Take 3 mLs by nebulization every evening.      linezolid (ZYVOX) 600  MG tablet TAKE 1 TABLET (600 MG TOTAL) BY MOUTH  EVERY TWELVE HOURS FOR 10 DAYS. 20 tablet 0   lisinopril (ZESTRIL) 5 MG tablet Take 0.5 tablets (2.5 mg total) by mouth daily.     meclizine (ANTIVERT) 25 MG tablet Take 25 mg by mouth daily as needed for dizziness.     metFORMIN (GLUCOPHAGE) 1000 MG tablet Take 1,000 mg by mouth 2 (two) times daily.     nitroGLYCERIN (NITROSTAT) 0.4 MG SL tablet PLACE 1 TABLET (0.4 MG TOTAL) UNDER THE TONGUE EVERY 5  MINUTES AS NEEDED FOR CHEST PAIN. 25 tablet 3   OXYGEN Inhale 2 L/min into the lungs See admin instructions. 2 L/min of oxygen at bedtime and during the day as needed for shortness of breath     pantoprazole (PROTONIX) 40 MG tablet Take 1 tablet (40 mg total) by mouth 2 (two) times daily. To protect stomach while taking multiple blood thinners. 60 tablet 2   potassium chloride SA (K-DUR) 20 MEQ tablet Take 1 tablet (20 mEq total) by mouth daily. 30 tablet 1   psyllium (METAMUCIL) 58.6 % packet Take 0.5 packets by mouth 2 (two) times daily. MIX AND DRINK     SPIRIVA HANDIHALER 18 MCG inhalation capsule Place 1 capsule into inhaler and inhale daily after breakfast.      traMADol (ULTRAM) 50 MG tablet Take 1 tablet (50 mg total) by mouth every 8 (eight) hours as needed. 20 tablet 0   traZODone (DESYREL) 100 MG tablet Take 100-200 mg by mouth at bedtime.      TRUE METRIX BLOOD GLUCOSE TEST test strip      isosorbide mononitrate (IMDUR) 30 MG 24 hr tablet Take 1.5 tablets (45 mg total) by mouth in the morning and at bedtime. 270 tablet 3   No current facility-administered medications for this visit.    Allergies  Allergen Reactions   Bactrim [Sulfamethoxazole-Trimethoprim] Other (See Comments)    Pancytopenia   Codeine Shortness Of Breath   Doxycycline Swelling    Swelling and numbness in lips and face. Swelling improved after stopping. Reports still experiences numbness in bottom lip.    Feraheme [Ferumoxytol] Other (See Comments)    Diaphoretic, chest pain   Heparin Other (See Comments)     +HIT,  Severe bleeding (with heparin drip & large doses), tolerates low doses   Losartan Swelling   Oxycodone Other (See Comments)    "Made me act out of my mind" Mental status changes- hallucinations   Latex Rash     REVIEW OF SYSTEMS:   [X] denotes positive finding, [ ] denotes negative finding Cardiac  Comments:  Chest pain or chest pressure:    Shortness of breath upon exertion:    Short of breath when lying flat:    Irregular heart rhythm:        Vascular    Pain in calf, thigh, or hip brought on by ambulation:    Pain in feet at night that wakes you up from your sleep:     Blood clot in your veins:    Leg swelling:         Pulmonary    Oxygen at home:    Productive cough:     Wheezing:         Neurologic    Sudden weakness in arms or legs:     Sudden numbness in arms or legs:     Sudden onset of difficulty speaking or slurred speech:    Temporary loss   of vision in one eye:     Problems with dizziness:         Gastrointestinal    Blood in stool:     Vomited blood:         Genitourinary    Burning when urinating:     Blood in urine:        Psychiatric    Major depression:         Hematologic    Bleeding problems:    Problems with blood clotting too easily:        Skin    Rashes or ulcers:        Constitutional    Fever or chills:      PHYSICAL EXAMINATION:  Vitals:   12/10/20 1443  BP: (!) 105/59  Pulse: 85  Resp: 16  Temp: (!) 97.2 F (36.2 C)  TempSrc: Temporal  SpO2: 95%  Weight: 183 lb (83 kg)  Height: 5' 7" (1.702 m)    General:  WDWN in NAD; vital signs documented above Gait: Not observed HENT: WNL, normocephalic Pulmonary: normal non-labored breathing , without Rales, rhonchi,  wheezing Cardiac: regular HR Abdomen: soft, NT, no masses Skin: without rashes Vascular Exam/Pulses: Brisk right DP and peroneal Doppler signal; soft right PTA signal; palpable left femoral pulse Extremities:   Musculoskeletal: no muscle wasting or  atrophy  Neurologic: A&O X 3;  No focal weakness or paresthesias are detected Psychiatric:  The pt has Normal affect.    ASSESSMENT/PLAN:: 87 y.o. male here for evaluation of new/bleeding ulcer to right dorsal foot  -Right foot is well-perfused based on Doppler exam with flow detected and DP, PT, and peroneal artery -Worsening ulceration with surrounding erythema; patient and wife state the redness has improved overnight since Bactrim was initiated by his PCP -Plan will be to perform angiography on Tuesday with Dr. Trula Slade to see if there is any way to optimize circulation.   -Over the weekend patient will perform at least twice daily warm water Dial soap soaks then pat foot dry.  He will also need twice daily dressing changes.  The patient's wife is in agreement and agrees to perform the described wound care. -Patient is also aware that he is at high risk for right leg amputation.  He will present to the emergency department if he develops any systemic infection symptoms or if the wound dramatically worsens prior to Tuesday's procedure -I discussed the risks of angiography with the patient and his wife and they agree to proceed with angiogram on Tuesday with Dr. Army Chaco, PA-C [facility] [phone]  Clinic MD:   Stanford Breed on call

## 2020-12-10 NOTE — Progress Notes (Signed)
Office Note     CC:  follow up Requesting Provider:  Monico Blitz, MD  HPI: Mike Wright is a 87 y.o. (19-Sep-1933) male who presents for evaluation of bleeding ulceration on right foot.  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  Prior to surgery he had a diagnostic arteriogram which demonstrated diffuse tibial disease however but no treatable flow-limiting stenosis at the time.  The patient and his wife state they had a blister appearing on the dorsal foot in the past few weeks which has slowly worsened.  He presented to his primary doctor yesterday after the blister burst and he encountered bleeding from the wound bed.  He also has redness ascending up his ankle.  He was started on Bactrim and referred back to our office today.  The patient denies any pain to his foot.  He also denies any chest pain, fevers, chills, nausea/vomiting.  He states he is at baseline always somewhat short of breath due to atrial fibrillation.  He is on Eliquis for atrial fibrillation.  Past medical history also significant for diabetes mellitus.   Past Medical History:  Diagnosis Date   Anemia    a. mild, noted 04/2017.   CAD in native artery    a. [name] 04/2017 s/p DES to D1, DES to prox-mid LAD, DES to prox LAD overlapping the prior stent, LVEF 55-65%.    Chronic diastolic CHF (congestive heart failure) (HCC)    Constipation    COPD (chronic obstructive pulmonary disease) (HCC)    Diabetic ulcer of toe (HCC)    DJD (degenerative joint disease) of cervical spine    Dysrhythmia    AFib   Essential hypertension    GERD (gastroesophageal reflux disease)    History of hiatal hernia    HIT (heparin-induced thrombocytopenia) ([place])    Hypothyroidism    Hypoxia    a. went home on home O2 04/2017.   Insomnia    Mixed hyperlipidemia    PAD (peripheral artery disease) (HCC)    PAF (paroxysmal atrial fibrillation) (HCC)    PVD (peripheral vascular disease) (HCC)    Renal  insufficiency    Retinal hemorrhage    lost 90% of vision.   Retinitis    Sinus bradycardia    a. HR 30s-40s in 04/2017 -> diltiazem stopped, metoprolol reduced.   Sleep apnea    "chose not to order CPAP at this time" (05/18/2017)   Type 2 diabetes mellitus ([place])    Wears glasses    Wheezing    a. suspected COPD 04/2017. Former tobacco x 40 years.    Past Surgical History:  Procedure Laterality Date   ABDOMINAL AORTOGRAM W/LOWER EXTREMITY N/A 09/15/2017   Procedure: ABDOMINAL AORTOGRAM W/LOWER EXTREMITY;  Surgeon: Serafina Mitchell, MD;  Location: [place] [facility];  Service: Cardiovascular;  Laterality: N/A;   ABDOMINAL AORTOGRAM W/LOWER EXTREMITY N/A 02/04/2020   Procedure: ABDOMINAL AORTOGRAM W/LOWER EXTREMITY;  Surgeon: Serafina Mitchell, MD;  Location: [place] [facility];  Service: Cardiovascular;  Laterality: N/A;   AMPUTATION Bilateral 09/20/2017   Procedure: BILATERAL GREAT TOE AMPUTATIONS INCLUDING METATARSAL HEADS;  Surgeon: Serafina Mitchell, MD;  Location: [place];  Service: Vascular;  Laterality: Bilateral;   AMPUTATION Right 03/01/2019   Procedure: AMPUTATION TOES;  Surgeon: Angelia Mould, MD;  Location: [place];  Service: Vascular;  Laterality: Right;   AMPUTATION Right 05/29/2019   Procedure: AMPUTATION RIGHT THIRD TOE;  Surgeon: Harold Barban  W, MD;  Location: [place];  Service: Vascular;  Laterality: Right;   AMPUTATION Right 09/26/2019   Procedure: AMPUTATION RIGHT FOURTH TOE AND FIFTH TOES;  Surgeon: Serafina Mitchell, MD;  Location: [place];  Service: Vascular;  Laterality: Right;   ANGIOPLASTY Right 09/20/2017   Procedure: ANGIOPLASTY RIGHT TIBIAL ARTERY;  Surgeon: Serafina Mitchell, MD;  Location: [place] [facility];  Service: Vascular;  Laterality: Right;   [place]; 2012;   CATARACT EXTRACTION Left 2004   COLONOSCOPY  2004   FLEISHMAN TICS   COLONOSCOPY WITH PROPOFOL N/A 11/28/2018   Procedure: COLONOSCOPY WITH PROPOFOL;   Surgeon: Rogene Houston, MD;  [facility];  Service: Endoscopy;  Laterality: N/A;   CORONARY ANGIOPLASTY WITH STENT PLACEMENT  05/18/2017   "3 stents"   CORONARY STENT INTERVENTION N/A 05/18/2017   Procedure: CORONARY STENT INTERVENTION;  Surgeon: Jettie Booze, MD;  Location: [place] [facility];  Service: Cardiovascular;  Laterality: N/A;   ESOPHAGOGASTRODUODENOSCOPY (EGD) WITH PROPOFOL N/A 11/20/2017   Procedure: ESOPHAGOGASTRODUODENOSCOPY (EGD) WITH PROPOFOL;  Surgeon: Irene Shipper, MD;  Location: [place];  Service: Gastroenterology;  Laterality: N/A;   ESOPHAGOGASTRODUODENOSCOPY (EGD) WITH PROPOFOL N/A 11/28/2018   Procedure: ESOPHAGOGASTRODUODENOSCOPY (EGD) WITH PROPOFOL;  Surgeon: Rogene Houston, MD;  [facility];  Service: Endoscopy;  Laterality: N/A;   ESOPHAGOGASTRODUODENOSCOPY (EGD) WITH PROPOFOL N/A 07/12/2019   Procedure: ESOPHAGOGASTRODUODENOSCOPY (EGD) WITH PROPOFOL;  Surgeon: Rogene Houston, MD;  [facility];  Service: Endoscopy;  Laterality: N/A;  [place] N/A 12/17/2018   Procedure: GIVENS CAPSULE STUDY;  Surgeon: Rogene Houston, MD;  [facility];  Service: Endoscopy;  Laterality: N/A;  7:30am   I & D EXTREMITY Right 12/17/2017   Procedure: IRRIGATION AND DEBRIDEMENT RIGHT GREAT TOE;  Surgeon: Serafina Mitchell, MD;  [facility];  Service: Vascular;  Laterality: Right;   JOINT REPLACEMENT     LEFT HEART CATH AND CORONARY ANGIOGRAPHY N/A 05/18/2017   Procedure: LEFT HEART CATH AND CORONARY ANGIOGRAPHY;  Surgeon: Jettie Booze, MD;  Location: [place] [facility];  Service: Cardiovascular;  Laterality: N/A;   LOWER EXTREMITY ANGIOGRAM Right 09/20/2017   Procedure: RIGHT LOWER LEG  ANGIOGRAM;  Surgeon: Serafina Mitchell, MD;  [facility];  Service: Vascular;  Laterality: Right;   LUMBAR FUSION  2002   L3, 4 L4, 5 L5 S1 Fused by Dr. Glenna Fellows   PERIPHERAL VASCULAR BALLOON ANGIOPLASTY Left  09/15/2017   Procedure: [place];  Surgeon: Serafina Mitchell, MD;  Location: [place] [facility];  Service: Cardiovascular;  Laterality: Left;  PTA of Peroneal & Posterior Tibial   POLYPECTOMY  11/28/2018   Procedure: POLYPECTOMY;  Surgeon: Rogene Houston, MD;  [facility];  Service: Endoscopy;;  duodenum   POSTERIOR LUMBAR FUSION     RHINOPLASTY     RIGHT HEART CATH N/A 05/18/2017   Procedure: RIGHT HEART CATH;  Surgeon: Jettie Booze, MD;  Location: [facility] [facility];  Service: Cardiovascular;  Laterality: N/A;   TEE WITHOUT CARDIOVERSION N/A 09/18/2017   Procedure: TRANSESOPHAGEAL ECHOCARDIOGRAM (TEE);  Surgeon: Fay Records, MD;  Location: [place];  Service: Cardiovascular;  Laterality: N/A;   TOTAL KNEE ARTHROPLASTY Bilateral    TRANSMETATARSAL AMPUTATION Right 02/04/2020   Procedure: RIGHT TRANSMETATARSAL AMPUTATION;  Surgeon: Serafina Mitchell, MD;  [facility];  Service: Vascular;  Laterality: Right;   TRANSURETHRAL RESECTION OF PROSTATE  2001   [place]    Social History   Socioeconomic History   Marital status: Married    Spouse name: Not on file   Number of children: Not on file   Years of education: Not on file   Highest education level: Not on file  Occupational History   Not on file  Tobacco Use   Smoking status: Former    Packs/day: 2.00    Years: 30.00    Pack years: 60.00    Types: Cigarettes    Start date: 03/28/1953    Quit date: 03/29/1983    Years since quitting: 37.7   Smokeless tobacco: Never  Vaping Use   Vaping Use: Never used  Substance and Sexual Activity   Alcohol use: Not Currently    Comment: 05/18/2017 'I drink a beer q yr"   Drug use: No   Sexual activity: Not Currently  Other Topics Concern   Not on file  Social History Narrative   Patient lives at home with his spouse.    Social Determinants of Health   Financial Resource Strain: Not on file  Food Insecurity: No Food Insecurity    Worried About Charity fundraiser in the Last Year: Never true   Ran Out of Food in the Last Year: Never true  Transportation Needs: No Transportation Needs   Lack of Transportation (Medical): No   Lack of Transportation (Non-Medical): No  Physical Activity: Not on file  Stress: Not on file  Social Connections: Not on file  Intimate Partner Violence: Not on file    Family History  Problem Relation Age of Onset   Heart attack Father 47   COPD Father    COPD Mother    Heart disease Mother    Diabetes Mother    Hypertension Sister    CVA Sister    Diabetes Sister    Multiple sclerosis Sister     Current Outpatient Medications  Medication Sig Dispense Refill   Accu-Chek Softclix Lancets lancets      acetaminophen (TYLENOL) 500 MG tablet Take 1 tablet (500 mg total) by mouth every 6 (six) hours as needed for mild pain or headache. 30 tablet 0   Alcohol Swabs (B-D SINGLE USE SWABS REGULAR) PADS      atorvastatin (LIPITOR) 80 MG tablet Take 80 mg by mouth every evening.      Blood Glucose Monitoring Suppl (ACCU-CHEK GUIDE) w/Device KIT      Cholecalciferol (VITAMIN D3) 50 MCG (2000 UT) TABS Take 2,000 Units by mouth daily.     Cyanocobalamin (B-12) 2500 MCG TABS Take 2,500 mcg by mouth daily.     DROPLET INSULIN SYRINGE 31G X 5/16" 1 ML MISC      ELIQUIS 5 MG TABS tablet TAKE 1 TABLET TWICE DAILY 353 tablet 3   folic acid (FOLVITE) 1 MG tablet Take 1 tablet (1 mg total) by mouth daily. 30 tablet 1   furosemide (LASIX) 80 MG tablet Take 80 mg by mouth See admin instructions. Take 80 mg by mouth in the morning and between 5-6 PM daily     insulin glargine (LANTUS) 100 UNIT/ML injection Inject 30-40 Units into the skin See admin instructions. Inject 30 units into the skin in the morning and 40 units at bedtime     ipratropium-albuterol (DUONEB) 0.5-2.5 (3) MG/3ML SOLN Take 3 mLs by nebulization every evening.      linezolid (ZYVOX) 600  MG tablet TAKE 1 TABLET (600 MG TOTAL) BY MOUTH  EVERY TWELVE HOURS FOR 10 DAYS. 20 tablet 0   lisinopril (ZESTRIL) 5 MG tablet Take 0.5 tablets (2.5 mg total) by mouth daily.     meclizine (ANTIVERT) 25 MG tablet Take 25 mg by mouth daily as needed for dizziness.     metFORMIN (GLUCOPHAGE) 1000 MG tablet Take 1,000 mg by mouth 2 (two) times daily.     nitroGLYCERIN (NITROSTAT) 0.4 MG SL tablet PLACE 1 TABLET (0.4 MG TOTAL) UNDER THE TONGUE EVERY 5  MINUTES AS NEEDED FOR CHEST PAIN. 25 tablet 3   OXYGEN Inhale 2 L/min into the lungs See admin instructions. 2 L/min of oxygen at bedtime and during the day as needed for shortness of breath     pantoprazole (PROTONIX) 40 MG tablet Take 1 tablet (40 mg total) by mouth 2 (two) times daily. To protect stomach while taking multiple blood thinners. 60 tablet 2   potassium chloride SA (K-DUR) 20 MEQ tablet Take 1 tablet (20 mEq total) by mouth daily. 30 tablet 1   psyllium (METAMUCIL) 58.6 % packet Take 0.5 packets by mouth 2 (two) times daily. MIX AND DRINK     SPIRIVA HANDIHALER 18 MCG inhalation capsule Place 1 capsule into inhaler and inhale daily after breakfast.      traMADol (ULTRAM) 50 MG tablet Take 1 tablet (50 mg total) by mouth every 8 (eight) hours as needed. 20 tablet 0   traZODone (DESYREL) 100 MG tablet Take 100-200 mg by mouth at bedtime.      TRUE METRIX BLOOD GLUCOSE TEST test strip      isosorbide mononitrate (IMDUR) 30 MG 24 hr tablet Take 1.5 tablets (45 mg total) by mouth in the morning and at bedtime. 270 tablet 3   No current facility-administered medications for this visit.    Allergies  Allergen Reactions   Bactrim [Sulfamethoxazole-Trimethoprim] Other (See Comments)    Pancytopenia   Codeine Shortness Of Breath   Doxycycline Swelling    Swelling and numbness in lips and face. Swelling improved after stopping. Reports still experiences numbness in bottom lip.    Feraheme [Ferumoxytol] Other (See Comments)    Diaphoretic, chest pain   Heparin Other (See Comments)     +HIT,  Severe bleeding (with heparin drip & large doses), tolerates low doses   Losartan Swelling   Oxycodone Other (See Comments)    "Made me act out of my mind" Mental status changes- hallucinations   Latex Rash     REVIEW OF SYSTEMS:   [X] denotes positive finding, [ ] denotes negative finding Cardiac  Comments:  Chest pain or chest pressure:    Shortness of breath upon exertion:    Short of breath when lying flat:    Irregular heart rhythm:        Vascular    Pain in calf, thigh, or hip brought on by ambulation:    Pain in feet at night that wakes you up from your sleep:     Blood clot in your veins:    Leg swelling:         Pulmonary    Oxygen at home:    Productive cough:     Wheezing:         Neurologic    Sudden weakness in arms or legs:     Sudden numbness in arms or legs:     Sudden onset of difficulty speaking or slurred speech:    Temporary loss  of vision in one eye:     Problems with dizziness:         Gastrointestinal    Blood in stool:     Vomited blood:         Genitourinary    Burning when urinating:     Blood in urine:        Psychiatric    Major depression:         Hematologic    Bleeding problems:    Problems with blood clotting too easily:        Skin    Rashes or ulcers:        Constitutional    Fever or chills:      PHYSICAL EXAMINATION:  Vitals:   12/10/20 1443  BP: (!) 105/59  Pulse: 85  Resp: 16  Temp: (!) 97.2 F (36.2 C)  TempSrc: Temporal  SpO2: 95%  Weight: 183 lb (83 kg)  Height: 5' 7" (1.702 m)    General:  WDWN in NAD; vital signs documented above Gait: Not observed HENT: WNL, normocephalic Pulmonary: normal non-labored breathing , without Rales, rhonchi,  wheezing Cardiac: regular HR Abdomen: soft, NT, no masses Skin: without rashes Vascular Exam/Pulses: Brisk right DP and peroneal Doppler signal; soft right PTA signal; palpable left femoral pulse Extremities:   Musculoskeletal: no muscle wasting or  atrophy  Neurologic: A&O X 3;  No focal weakness or paresthesias are detected Psychiatric:  The pt has Normal affect.    ASSESSMENT/PLAN:: 87 y.o. male here for evaluation of new/bleeding ulcer to right dorsal foot  -Right foot is well-perfused based on Doppler exam with flow detected and DP, PT, and peroneal artery -Worsening ulceration with surrounding erythema; patient and wife state the redness has improved overnight since Bactrim was initiated by his PCP -Plan will be to perform angiography on Tuesday with Dr. Trula Slade to see if there is any way to optimize circulation.   -Over the weekend patient will perform at least twice daily warm water Dial soap soaks then pat foot dry.  He will also need twice daily dressing changes.  The patient's wife is in agreement and agrees to perform the described wound care. -Patient is also aware that he is at high risk for right leg amputation.  He will present to the emergency department if he develops any systemic infection symptoms or if the wound dramatically worsens prior to Tuesday's procedure -I discussed the risks of angiography with the patient and his wife and they agree to proceed with angiogram on Tuesday with Dr. Army Chaco, PA-C [facility] [phone]  Clinic MD:   Stanford Breed on call

## 2020-12-10 NOTE — Telephone Encounter (Signed)
Patient calls today to report spontaneous bleeding from the top of his foot. His TMA site is still approximated. Michela Pitcher it was a steady ooze that took them about an hour to get under control. It is painful and has hurt for about a week and a half. He has several bandages on his foot currently. Discussed with PA and will bring patient in today for evaluation.

## 2020-12-14 ENCOUNTER — Ambulatory Visit: Payer: Medicare HMO

## 2020-12-15 ENCOUNTER — Inpatient Hospital Stay (HOSPITAL_COMMUNITY)
Admission: RE | Admit: 2020-12-15 | Discharge: 2020-12-19 | DRG: 253 | Disposition: A | Discharge status: Home Health | Payer: Medicare HMO | Source: Ambulatory Visit | Attending: Surgery | Admitting: Surgery

## 2020-12-15 ENCOUNTER — Encounter (HOSPITAL_COMMUNITY)
Admission: RE | Disposition: A | Discharge status: Home Health | Payer: Self-pay | Source: Ambulatory Visit | Attending: Surgery

## 2020-12-15 ENCOUNTER — Other Ambulatory Visit: Payer: Self-pay

## 2020-12-15 DIAGNOSIS — Z89421 Acquired absence of other right toe(s): Secondary | ICD-10-CM | POA: Diagnosis not present

## 2020-12-15 DIAGNOSIS — E11621 Type 2 diabetes mellitus with foot ulcer: Secondary | ICD-10-CM | POA: Diagnosis present

## 2020-12-15 DIAGNOSIS — Z96653 Presence of artificial knee joint, bilateral: Secondary | ICD-10-CM | POA: Diagnosis present

## 2020-12-15 DIAGNOSIS — Z823 Family history of stroke: Secondary | ICD-10-CM | POA: Diagnosis not present

## 2020-12-15 DIAGNOSIS — Z825 Family history of asthma and other chronic lower respiratory diseases: Secondary | ICD-10-CM

## 2020-12-15 DIAGNOSIS — D62 Acute posthemorrhagic anemia: Secondary | ICD-10-CM | POA: Diagnosis present

## 2020-12-15 DIAGNOSIS — E039 Hypothyroidism, unspecified: Secondary | ICD-10-CM | POA: Diagnosis present

## 2020-12-15 DIAGNOSIS — I2510 Atherosclerotic heart disease of native coronary artery without angina pectoris: Secondary | ICD-10-CM | POA: Diagnosis present

## 2020-12-15 DIAGNOSIS — L97511 Non-pressure chronic ulcer of other part of right foot limited to breakdown of skin: Secondary | ICD-10-CM | POA: Diagnosis present

## 2020-12-15 DIAGNOSIS — I110 Hypertensive heart disease with heart failure: Secondary | ICD-10-CM | POA: Diagnosis present

## 2020-12-15 DIAGNOSIS — Z981 Arthrodesis status: Secondary | ICD-10-CM

## 2020-12-15 DIAGNOSIS — Z888 Allergy status to other drugs, medicaments and biological substances status: Secondary | ICD-10-CM

## 2020-12-15 DIAGNOSIS — Z91040 Latex allergy status: Secondary | ICD-10-CM

## 2020-12-15 DIAGNOSIS — G4730 Sleep apnea, unspecified: Secondary | ICD-10-CM | POA: Diagnosis present

## 2020-12-15 DIAGNOSIS — Z8249 Family history of ischemic heart disease and other diseases of the circulatory system: Secondary | ICD-10-CM

## 2020-12-15 DIAGNOSIS — I739 Peripheral vascular disease, unspecified: Secondary | ICD-10-CM | POA: Diagnosis present

## 2020-12-15 DIAGNOSIS — I5032 Chronic diastolic (congestive) heart failure: Secondary | ICD-10-CM | POA: Diagnosis present

## 2020-12-15 DIAGNOSIS — J449 Chronic obstructive pulmonary disease, unspecified: Secondary | ICD-10-CM | POA: Diagnosis present

## 2020-12-15 DIAGNOSIS — Z794 Long term (current) use of insulin: Secondary | ICD-10-CM

## 2020-12-15 DIAGNOSIS — Z79899 Other long term (current) drug therapy: Secondary | ICD-10-CM

## 2020-12-15 DIAGNOSIS — Z20822 Contact with and (suspected) exposure to covid-19: Secondary | ICD-10-CM | POA: Diagnosis present

## 2020-12-15 DIAGNOSIS — Z7901 Long term (current) use of anticoagulants: Secondary | ICD-10-CM

## 2020-12-15 DIAGNOSIS — Z833 Family history of diabetes mellitus: Secondary | ICD-10-CM

## 2020-12-15 DIAGNOSIS — Z89612 Acquired absence of left leg above knee: Secondary | ICD-10-CM | POA: Diagnosis not present

## 2020-12-15 DIAGNOSIS — I70261 Atherosclerosis of native arteries of extremities with gangrene, right leg: Secondary | ICD-10-CM | POA: Diagnosis present

## 2020-12-15 DIAGNOSIS — G4700 Insomnia, unspecified: Secondary | ICD-10-CM | POA: Diagnosis present

## 2020-12-15 DIAGNOSIS — I480 Paroxysmal atrial fibrillation: Secondary | ICD-10-CM | POA: Diagnosis present

## 2020-12-15 DIAGNOSIS — K219 Gastro-esophageal reflux disease without esophagitis: Secondary | ICD-10-CM | POA: Diagnosis present

## 2020-12-15 DIAGNOSIS — Z955 Presence of coronary angioplasty implant and graft: Secondary | ICD-10-CM

## 2020-12-15 DIAGNOSIS — E1152 Type 2 diabetes mellitus with diabetic peripheral angiopathy with gangrene: Principal | ICD-10-CM | POA: Diagnosis present

## 2020-12-15 DIAGNOSIS — Z885 Allergy status to narcotic agent status: Secondary | ICD-10-CM

## 2020-12-15 DIAGNOSIS — Z820 Family history of epilepsy and other diseases of the nervous system: Secondary | ICD-10-CM | POA: Diagnosis not present

## 2020-12-15 DIAGNOSIS — M79671 Pain in right foot: Secondary | ICD-10-CM | POA: Diagnosis present

## 2020-12-15 DIAGNOSIS — Z881 Allergy status to other antibiotic agents status: Secondary | ICD-10-CM

## 2020-12-15 DIAGNOSIS — S91309A Unspecified open wound, unspecified foot, initial encounter: Secondary | ICD-10-CM

## 2020-12-15 DIAGNOSIS — L97519 Non-pressure chronic ulcer of other part of right foot with unspecified severity: Secondary | ICD-10-CM | POA: Diagnosis present

## 2020-12-15 DIAGNOSIS — Z7984 Long term (current) use of oral hypoglycemic drugs: Secondary | ICD-10-CM

## 2020-12-15 DIAGNOSIS — I70235 Atherosclerosis of native arteries of right leg with ulceration of other part of foot: Secondary | ICD-10-CM | POA: Diagnosis not present

## 2020-12-15 DIAGNOSIS — Z87891 Personal history of nicotine dependence: Secondary | ICD-10-CM

## 2020-12-15 DIAGNOSIS — E782 Mixed hyperlipidemia: Secondary | ICD-10-CM | POA: Diagnosis present

## 2020-12-15 DIAGNOSIS — Z9981 Dependence on supplemental oxygen: Secondary | ICD-10-CM

## 2020-12-15 HISTORY — PX: PERIPHERAL VASCULAR INTERVENTION: CATH118257

## 2020-12-15 HISTORY — PX: ABDOMINAL AORTOGRAM W/LOWER EXTREMITY: CATH118223

## 2020-12-15 LAB — POCT I-STAT, CHEM 8
BUN: 20 mg/dL (ref 8–23)
Calcium, Ion: 1.08 mmol/L — ABNORMAL LOW (ref 1.15–1.40)
Chloride: 101 mmol/L (ref 98–111)
Creatinine, Ser: 1.9 mg/dL — ABNORMAL HIGH (ref 0.61–1.24)
Glucose, Bld: 100 mg/dL — ABNORMAL HIGH (ref 70–99)
HCT: 25 % — ABNORMAL LOW (ref 39.0–52.0)
Hemoglobin: 8.5 g/dL — ABNORMAL LOW (ref 13.0–17.0)
Potassium: 4.1 mmol/L (ref 3.5–5.1)
Sodium: 137 mmol/L (ref 135–145)
TCO2: 24 mmol/L (ref 22–32)

## 2020-12-15 LAB — HEMOGLOBIN A1C
Hgb A1c MFr Bld: 6.8 % — ABNORMAL HIGH (ref 4.8–5.6)
Mean Plasma Glucose: 148.46 mg/dL

## 2020-12-15 LAB — POCT ACTIVATED CLOTTING TIME: Activated Clotting Time: 352 seconds

## 2020-12-15 LAB — GLUCOSE, CAPILLARY
Glucose-Capillary: 90 mg/dL (ref 70–99)
Glucose-Capillary: 93 mg/dL (ref 70–99)
Glucose-Capillary: 266 mg/dL — ABNORMAL HIGH (ref 70–99)

## 2020-12-15 SURGERY — ABDOMINAL AORTOGRAM W/LOWER EXTREMITY
Anesthesia: LOCAL | Laterality: Right

## 2020-12-15 MED ORDER — POTASSIUM CHLORIDE CRYS ER 20 MEQ PO TBCR
20.0000 meq | EXTENDED_RELEASE_TABLET | Freq: Every day | ORAL | Status: DC
  Administered 2020-12-16 – 2020-12-19 (×4): 20 meq via ORAL
  Filled 2020-12-15 (×4): qty 1

## 2020-12-15 MED ORDER — ACETAMINOPHEN 500 MG PO TABS: 500.0000 mg | ORAL_TABLET | Freq: Four times a day (QID) | ORAL | Status: DC | PRN

## 2020-12-15 MED ORDER — IODIXANOL 320 MG/ML IV SOLN
INTRAVENOUS | Status: DC | PRN
  Administered 2020-12-15: 165 mL

## 2020-12-15 MED ORDER — APIXABAN 5 MG PO TABS
5.0000 mg | ORAL_TABLET | Freq: Two times a day (BID) | ORAL | Status: DC
  Administered 2020-12-16: 5 mg via ORAL
  Filled 2020-12-15: qty 1

## 2020-12-15 MED ORDER — UMECLIDINIUM BROMIDE 62.5 MCG/INH IN AEPB
1.0000 | INHALATION_SPRAY | Freq: Every day | RESPIRATORY_TRACT | Status: DC
  Administered 2020-12-16 – 2020-12-19 (×4): 1 via RESPIRATORY_TRACT
  Filled 2020-12-15: qty 7

## 2020-12-15 MED ORDER — SODIUM CHLORIDE 0.9 % IV SOLN: INTRAVENOUS | Status: DC

## 2020-12-15 MED ORDER — HYDRALAZINE HCL 20 MG/ML IJ SOLN: 5.0000 mg | INTRAMUSCULAR | Status: DC | PRN

## 2020-12-15 MED ORDER — SODIUM CHLORIDE 0.9% FLUSH: 3.0000 mL | INTRAVENOUS | Status: DC | PRN

## 2020-12-15 MED ORDER — FUROSEMIDE 40 MG PO TABS
80.0000 mg | ORAL_TABLET | Freq: Two times a day (BID) | ORAL | Status: DC
  Administered 2020-12-15 – 2020-12-19 (×8): 80 mg via ORAL
  Filled 2020-12-15: qty 2
  Filled 2020-12-15 (×2): qty 1
  Filled 2020-12-15 (×3): qty 2
  Filled 2020-12-15: qty 1
  Filled 2020-12-15 (×4): qty 2
  Filled 2020-12-15: qty 1

## 2020-12-15 MED ORDER — SODIUM CHLORIDE 0.9 % IV SOLN
INTRAVENOUS | Status: DC | PRN
  Administered 2020-12-15: 1.75 mg/kg/h via INTRAVENOUS

## 2020-12-15 MED ORDER — ONDANSETRON HCL 4 MG/2ML IJ SOLN: 4.0000 mg | Freq: Four times a day (QID) | INTRAMUSCULAR | Status: DC | PRN

## 2020-12-15 MED ORDER — VITAMIN D 25 MCG (1000 UNIT) PO TABS
2000.0000 [IU] | ORAL_TABLET | Freq: Every day | ORAL | Status: DC
  Administered 2020-12-16 – 2020-12-19 (×4): 2000 [IU] via ORAL
  Filled 2020-12-15 (×4): qty 2

## 2020-12-15 MED ORDER — VANCOMYCIN HCL 1500 MG/300ML IV SOLN
1500.0000 mg | Freq: Once | INTRAVENOUS | Status: AC
  Administered 2020-12-15: 1500 mg via INTRAVENOUS
  Filled 2020-12-15: qty 300

## 2020-12-15 MED ORDER — FENTANYL CITRATE (PF) 100 MCG/2ML IJ SOLN
INTRAMUSCULAR | Status: AC
  Filled 2020-12-15: qty 2

## 2020-12-15 MED ORDER — ISOSORBIDE MONONITRATE ER 30 MG PO TB24
45.0000 mg | ORAL_TABLET | Freq: Every day | ORAL | Status: DC
  Administered 2020-12-15 – 2020-12-19 (×5): 45 mg via ORAL
  Filled 2020-12-15 (×5): qty 2

## 2020-12-15 MED ORDER — INSULIN ASPART 100 UNIT/ML IJ SOLN
0.0000 [IU] | Freq: Three times a day (TID) | INTRAMUSCULAR | Status: DC
  Administered 2020-12-16 – 2020-12-17 (×3): 3 [IU] via SUBCUTANEOUS
  Administered 2020-12-17 – 2020-12-18 (×2): 5 [IU] via SUBCUTANEOUS
  Administered 2020-12-18: 3 [IU] via SUBCUTANEOUS
  Administered 2020-12-18: 8 [IU] via SUBCUTANEOUS
  Administered 2020-12-19: 5 [IU] via SUBCUTANEOUS

## 2020-12-15 MED ORDER — ACETAMINOPHEN 325 MG PO TABS
650.0000 mg | ORAL_TABLET | ORAL | Status: DC | PRN
  Administered 2020-12-16: 650 mg via ORAL
  Filled 2020-12-15: qty 2

## 2020-12-15 MED ORDER — PIPERACILLIN-TAZOBACTAM 3.375 G IVPB 30 MIN
3.3750 g | Freq: Once | INTRAVENOUS | Status: AC
  Administered 2020-12-15: 3.375 g via INTRAVENOUS
  Filled 2020-12-15 (×2): qty 50

## 2020-12-15 MED ORDER — LIDOCAINE HCL (PF) 1 % IJ SOLN
INTRAMUSCULAR | Status: AC
  Filled 2020-12-15: qty 30

## 2020-12-15 MED ORDER — ONDANSETRON HCL 4 MG PO TABS: 4.0000 mg | ORAL_TABLET | Freq: Every day | ORAL | Status: DC | PRN

## 2020-12-15 MED ORDER — BIVALIRUDIN TRIFLUOROACETATE 250 MG IV SOLR
INTRAVENOUS | Status: AC
  Filled 2020-12-15: qty 250

## 2020-12-15 MED ORDER — NITROGLYCERIN 0.4 MG SL SUBL: 0.4000 mg | SUBLINGUAL_TABLET | SUBLINGUAL | Status: DC | PRN

## 2020-12-15 MED ORDER — LABETALOL HCL 5 MG/ML IV SOLN: 10.0000 mg | INTRAVENOUS | Status: DC | PRN

## 2020-12-15 MED ORDER — LIDOCAINE HCL (PF) 1 % IJ SOLN
INTRAMUSCULAR | Status: DC | PRN
  Administered 2020-12-15: 12 mL

## 2020-12-15 MED ORDER — FENTANYL CITRATE (PF) 100 MCG/2ML IJ SOLN
INTRAMUSCULAR | Status: DC | PRN
  Administered 2020-12-15: 25 ug via INTRAVENOUS
  Administered 2020-12-15: 50 ug via INTRAVENOUS
  Administered 2020-12-15: 25 ug via INTRAVENOUS

## 2020-12-15 MED ORDER — ASPIRIN EC 81 MG PO TBEC
81.0000 mg | DELAYED_RELEASE_TABLET | Freq: Every day | ORAL | Status: DC
  Administered 2020-12-16 – 2020-12-19 (×4): 81 mg via ORAL
  Filled 2020-12-15 (×4): qty 1

## 2020-12-15 MED ORDER — TIOTROPIUM BROMIDE MONOHYDRATE 18 MCG IN CAPS: 1.0000 | ORAL_CAPSULE | Freq: Every day | RESPIRATORY_TRACT | Status: DC

## 2020-12-15 MED ORDER — IPRATROPIUM-ALBUTEROL 0.5-2.5 (3) MG/3ML IN SOLN: 3.0000 mL | RESPIRATORY_TRACT | Status: DC

## 2020-12-15 MED ORDER — MORPHINE SULFATE (PF) 2 MG/ML IV SOLN
2.0000 mg | INTRAVENOUS | Status: DC | PRN
  Administered 2020-12-16: 2 mg via INTRAVENOUS
  Filled 2020-12-15: qty 1

## 2020-12-15 MED ORDER — MIDAZOLAM HCL 2 MG/2ML IJ SOLN
INTRAMUSCULAR | Status: AC
  Filled 2020-12-15: qty 2

## 2020-12-15 MED ORDER — PIPERACILLIN-TAZOBACTAM 3.375 G IVPB
3.3750 g | Freq: Three times a day (TID) | INTRAVENOUS | Status: DC
  Administered 2020-12-16 (×2): 3.375 g via INTRAVENOUS
  Filled 2020-12-15 (×2): qty 50

## 2020-12-15 MED ORDER — SODIUM CHLORIDE 0.9% FLUSH
3.0000 mL | Freq: Two times a day (BID) | INTRAVENOUS | Status: DC
  Administered 2020-12-16 – 2020-12-18 (×4): 3 mL via INTRAVENOUS

## 2020-12-15 MED ORDER — LISINOPRIL 2.5 MG PO TABS
2.5000 mg | ORAL_TABLET | Freq: Every day | ORAL | Status: DC
  Administered 2020-12-16 – 2020-12-19 (×4): 2.5 mg via ORAL
  Filled 2020-12-15 (×4): qty 1

## 2020-12-15 MED ORDER — PANTOPRAZOLE SODIUM 40 MG PO TBEC
40.0000 mg | DELAYED_RELEASE_TABLET | Freq: Two times a day (BID) | ORAL | Status: DC
  Administered 2020-12-15 – 2020-12-19 (×8): 40 mg via ORAL
  Filled 2020-12-15 (×10): qty 1

## 2020-12-15 MED ORDER — VANCOMYCIN HCL 750 MG/150ML IV SOLN
750.0000 mg | INTRAVENOUS | Status: DC
  Administered 2020-12-16 – 2020-12-18 (×3): 750 mg via INTRAVENOUS
  Filled 2020-12-15 (×4): qty 150

## 2020-12-15 MED ORDER — CLOPIDOGREL BISULFATE 75 MG PO TABS
75.0000 mg | ORAL_TABLET | Freq: Every day | ORAL | Status: DC
  Administered 2020-12-16 – 2020-12-18 (×3): 75 mg via ORAL
  Filled 2020-12-15 (×5): qty 1

## 2020-12-15 MED ORDER — MECLIZINE HCL 12.5 MG PO TABS
12.5000 mg | ORAL_TABLET | Freq: Every day | ORAL | Status: DC | PRN
  Filled 2020-12-15: qty 1

## 2020-12-15 MED ORDER — SODIUM CHLORIDE 0.9 % WEIGHT BASED INFUSION
1.0000 mL/kg/h | INTRAVENOUS | Status: AC
  Administered 2020-12-15 (×2): 1 mL/kg/h via INTRAVENOUS

## 2020-12-15 MED ORDER — B-12 2500 MCG PO TABS: 2500.0000 ug | ORAL_TABLET | Freq: Every day | ORAL | Status: DC

## 2020-12-15 MED ORDER — TRAZODONE HCL 150 MG PO TABS
150.0000 mg | ORAL_TABLET | Freq: Every day | ORAL | Status: DC
  Administered 2020-12-15 – 2020-12-18 (×4): 150 mg via ORAL
  Filled 2020-12-15 (×4): qty 1

## 2020-12-15 MED ORDER — ATORVASTATIN CALCIUM 80 MG PO TABS
80.0000 mg | ORAL_TABLET | Freq: Every evening | ORAL | Status: DC
  Administered 2020-12-15 – 2020-12-18 (×4): 80 mg via ORAL
  Filled 2020-12-15 (×4): qty 1

## 2020-12-15 MED ORDER — FOLIC ACID 1 MG PO TABS
1.0000 mg | ORAL_TABLET | Freq: Every day | ORAL | Status: DC
  Administered 2020-12-16 – 2020-12-19 (×4): 1 mg via ORAL
  Filled 2020-12-15 (×4): qty 1

## 2020-12-15 MED ORDER — MIDAZOLAM HCL 2 MG/2ML IJ SOLN
INTRAMUSCULAR | Status: DC | PRN
  Administered 2020-12-15: 1 mg via INTRAVENOUS
  Administered 2020-12-15: 2 mg via INTRAVENOUS
  Administered 2020-12-15: 1 mg via INTRAVENOUS

## 2020-12-15 MED ORDER — PSYLLIUM 95 % PO PACK
1.0000 | PACK | Freq: Every day | ORAL | Status: DC
  Administered 2020-12-16 – 2020-12-19 (×4): 1 via ORAL
  Filled 2020-12-15 (×4): qty 1

## 2020-12-15 MED ORDER — SODIUM CHLORIDE 0.9 % IV SOLN
250.0000 mL | INTRAVENOUS | Status: DC | PRN
  Administered 2020-12-17: 250 mL via INTRAVENOUS

## 2020-12-15 MED ORDER — HEPARIN (PORCINE) IN NACL 1000-0.9 UT/500ML-% IV SOLN
INTRAVENOUS | Status: AC
  Filled 2020-12-15: qty 500

## 2020-12-15 MED ORDER — BIVALIRUDIN BOLUS VIA INFUSION - CUPID
INTRAVENOUS | Status: DC | PRN
  Administered 2020-12-15: 58.5 mg via INTRAVENOUS

## 2020-12-15 SURGICAL SUPPLY — 21 items
BALLN MUSTANG 6X100X135 (BALLOONS) ×6
BALLOON MUSTANG 6X100X135 (BALLOONS) ×4 IMPLANT
CATH ANGIO 5F PIGTAIL 65CM (CATHETERS) ×3 IMPLANT
DEVICE TORQUE H2O (MISCELLANEOUS) ×3 IMPLANT
DEVICE VASC CLSR CELT ART 6 (Vascular Products) ×3 IMPLANT
GLIDEWIRE NITREX 0.018X80X5 (WIRE) ×3
GUIDEWIRE ANGLED .035X150CM (WIRE) ×3 IMPLANT
GUIDEWIRE NITREX 0.018X80X5 (WIRE) ×2 IMPLANT
KIT ENCORE 26 ADVANTAGE (KITS) ×3 IMPLANT
KIT MICROPUNCTURE NIT STIFF (SHEATH) ×3 IMPLANT
KIT PV (KITS) ×3 IMPLANT
SHEATH PINNACLE 5F 10CM (SHEATH) ×3 IMPLANT
SHEATH PINNACLE 6F 10CM (SHEATH) ×3 IMPLANT
SHEATH PINNACLE MP 6F 45CM (SHEATH) ×3 IMPLANT
SHEATH PROBE COVER 6X72 (BAG) ×3 IMPLANT
STENT ELUVIA 7X100X130 (Permanent Stent) ×3 IMPLANT
SYR MEDRAD MARK V 150ML (SYRINGE) ×3 IMPLANT
TRANSDUCER W/STOPCOCK (MISCELLANEOUS) ×3 IMPLANT
TRAY PV CATH (CUSTOM PROCEDURE TRAY) ×3 IMPLANT
WIRE BENTSON .035X145CM (WIRE) ×3 IMPLANT
WIRE HI TORQ VERSACORE 300 (WIRE) ×3 IMPLANT

## 2020-12-15 NOTE — Interval H&P Note (Signed)
History and Physical Interval Note:  12/15/2020 2:11 PM  Mike Wright  has presented today for surgery, with the diagnosis of ischemia.  The various methods of treatment have been discussed with the patient and family. After consideration of risks, benefits and other options for treatment, the patient has consented to  Procedure(s): ABDOMINAL AORTOGRAM W/LOWER EXTREMITY (N/A) as a surgical intervention.  The patient's history has been reviewed, patient examined, no change in status, stable for surgery.  I have reviewed the patient's chart and labs.  Questions were answered to the patient's satisfaction.     [name]

## 2020-12-15 NOTE — Progress Notes (Signed)
Pt arrived from cath lab, VSS, CHG complete, orders released, groin level 0, oriented to unit, call light within reach, will continue to monitor.   Chrisandra Carota, RN 12/15/2020 5:11 PM

## 2020-12-15 NOTE — Progress Notes (Signed)
Patient admitted after angio for IV abx and wound care.  WB

## 2020-12-15 NOTE — Progress Notes (Signed)
Pharmacy Antibiotic Note  Mike Wright is a 87 y.o. male admitted on 12/15/2020 with R foot ulcer.  Pharmacy has been consulted for vancomycin and zosyn dosing for wound infection.   Plan: Zosyn 3.375g IV q8h (EI) Vancomycin 1500mg  x1 followed by 750mg  q24h (eAUC 504 using Scr 1.9)  Height: 5\' 7"  (170.2 cm) Weight: 78 kg (172 lb) IBW/kg (Calculated) : 66.1  Temp (24hrs), Avg:97.9 F (36.6 C), Min:97.7 F (36.5 C), Max:98.1 F (36.7 C)  Recent Labs  Lab 12/15/20 1150  CREATININE 1.90*    Estimated Creatinine Clearance: 26.6 mL/min (A) (by C-G formula based on SCr of 1.9 mg/dL (H)).    Allergies  Allergen Reactions   Bactrim [Sulfamethoxazole-Trimethoprim] Other (See Comments)    Pancytopenia   Codeine Shortness Of Breath   Doxycycline Swelling    Swelling and numbness in lips and face. Swelling improved after stopping. Reports still experiences numbness in bottom lip.    Feraheme [Ferumoxytol] Other (See Comments)    Diaphoretic, chest pain   Heparin Other (See Comments)    +HIT,  Severe bleeding (with heparin drip & large doses), tolerates low doses   Losartan Swelling   Oxycodone Other (See Comments)    "Made me act out of my mind" Mental status changes- hallucinations   Latex Rash    Antimicrobials this admission: Vancomycin 9/20 >>  Zosyn 9/20 >>  Dose adjustments this admission: N/a  Microbiology results:   Thank you for allowing pharmacy to be a part of this patient's care.  Cristela Felt, PharmD, BCPS Clinical Pharmacist 12/15/2020 6:14 PM

## 2020-12-15 NOTE — Op Note (Signed)
Patient name: Mike Wright MRN: [id number] DOB: [birth year] Sex: male  12/15/2020 Pre-operative Diagnosis: Right foot ulcer Post-operative diagnosis:  Same Surgeon:  [name] Procedure Performed:  1.  Ultrasound-guided access, left femoral artery  2.  Abdominal aortogram  3.  Bilateral lower extremity runoff  4.  Stent, right superficial femoral artery  5.  Conscious sedation, 65 minutes  6.  Closure device, Celt     Indications: This is a 87 year old gentleman with bilateral transmetatarsal amputations.  He has a new wound.  He comes in today for further evaluation  Procedure:  The patient was identified in the holding area and taken to room 8.  The patient was then placed supine on the table and prepped and draped in the usual sterile fashion.  A time out was called.  Conscious sedation was administered with the use of IV fentanyl and Versed under continuous physician and nurse monitoring.  Heart rate, blood pressure, and oxygen saturation were continuously monitored.  Total sedation time was 64 minutes.  Ultrasound was used to evaluate the left common femoral artery.  It was patent .  A digital ultrasound image was acquired.  A micropuncture needle was used to access the left common femoral artery under ultrasound guidance.  An 018 wire was advanced without resistance and a micropuncture sheath was placed.  The 018 wire was removed and a benson wire was placed.  The micropuncture sheath was exchanged for a 5 french sheath.  An omniflush catheter was advanced over the wire to the level of L-1.  An abdominal angiogram was obtained.  Next, using the omniflush catheter and a benson wire, the aortic bifurcation was crossed and the catheter was placed into theright external iliac artery and right runoff was obtained.  left runoff was performed via retrograde sheath injections.  Findings:   Aortogram: No significant renal artery stenosis was identified.  The infrarenal abdominal aorta is  widely patent.  Bilateral common and external iliac arteries are patent without significant stenosis.  Right Lower Extremity: The right common femoral and profundofemoral artery are patent throughout their course.  The superficial femoral artery has diffuse disease.  In the proximal portion there is an area that is very focal approximately 80% stenosis.  The popliteal artery is patent throughout its course with mild disease.  There is two-vessel runoff via the posterior tibial and peroneal artery which are small in caliber, mildly diseased, but without hemodynamically significant stenosis.  Left Lower Extremity: The left common femoral and profundofemoral artery are patent throughout their course.  The superficial femoral is mildly diseased throughout as is the popliteal artery.  There is two-vessel runoff via the posterior tibial and peroneal.  Intervention: After the above images were acquired, the decision was made to proceed with intervention.  A 6 French 45 cm sheath was advanced over the aortic bifurcation.  Because the patient history of heparin-induced thrombocytopenia, Angiomax bolus with a drip was initiated.  The ACT was greater than 350.  I then advanced a 035 versa core across the lesion in the superficial femoral artery and elected to primarily stent this with a 7 x 100 Elluvia.  This was postdilated with a 6 mm balloon.  Completion imaging revealed resolution of the stenosis.  The long sheath was exchanged out for a short sheath and a Celt device was used for closure without complication.  Impression:  #1  Approximate 80% proximal third superficial femoral artery stenosis successfully stented using a 7 mm Elluvia stent with  resolution of the stenosis   V. [name], M.D., [facility] [place] Office: [phone] Pager:  [phone]

## 2020-12-16 ENCOUNTER — Ambulatory Visit: Payer: Self-pay

## 2020-12-16 ENCOUNTER — Encounter (HOSPITAL_COMMUNITY): Payer: Self-pay | Admitting: Surgery

## 2020-12-16 DIAGNOSIS — Z89421 Acquired absence of other right toe(s): Secondary | ICD-10-CM | POA: Diagnosis not present

## 2020-12-16 DIAGNOSIS — I70235 Atherosclerosis of native arteries of right leg with ulceration of other part of foot: Secondary | ICD-10-CM | POA: Diagnosis not present

## 2020-12-16 DIAGNOSIS — D62 Acute posthemorrhagic anemia: Secondary | ICD-10-CM | POA: Diagnosis not present

## 2020-12-16 DIAGNOSIS — Z89612 Acquired absence of left leg above knee: Secondary | ICD-10-CM | POA: Diagnosis not present

## 2020-12-16 LAB — MRSA NEXT GEN BY PCR, NASAL: MRSA by PCR Next Gen: DETECTED — AB

## 2020-12-16 LAB — BASIC METABOLIC PANEL
Anion gap: 10 (ref 5–15)
BUN: 19 mg/dL (ref 8–23)
CO2: 23 mmol/L (ref 22–32)
Calcium: 8 mg/dL — ABNORMAL LOW (ref 8.9–10.3)
Chloride: 102 mmol/L (ref 98–111)
Creatinine, Ser: 1.92 mg/dL — ABNORMAL HIGH (ref 0.61–1.24)
GFR, Estimated: 34 mL/min — ABNORMAL LOW (ref >60)
Glucose, Bld: 138 mg/dL — ABNORMAL HIGH (ref 70–99)
Potassium: 3.9 mmol/L (ref 3.5–5.1)
Sodium: 135 mmol/L (ref 135–145)

## 2020-12-16 LAB — CBC
HCT: 22.8 % — ABNORMAL LOW (ref 39.0–52.0)
Hemoglobin: 7.2 g/dL — ABNORMAL LOW (ref 13.0–17.0)
MCH: 23.9 pg — ABNORMAL LOW (ref 26.0–34.0)
MCHC: 31.6 g/dL (ref 30.0–36.0)
MCV: 75.7 fL — ABNORMAL LOW (ref 80.0–100.0)
Platelets: 276 10*3/uL (ref 150–400)
RBC: 3.01 MIL/uL — ABNORMAL LOW (ref 4.22–5.81)
RDW: 18.6 % — ABNORMAL HIGH (ref 11.5–15.5)
WBC: 8.4 10*3/uL (ref 4.0–10.5)
nRBC: 0 % (ref 0.0–0.2)

## 2020-12-16 LAB — GLUCOSE, CAPILLARY
Glucose-Capillary: 120 mg/dL — ABNORMAL HIGH (ref 70–99)
Glucose-Capillary: 159 mg/dL — ABNORMAL HIGH (ref 70–99)
Glucose-Capillary: 171 mg/dL — ABNORMAL HIGH (ref 70–99)
Glucose-Capillary: 218 mg/dL — ABNORMAL HIGH (ref 70–99)

## 2020-12-16 LAB — PREPARE RBC (CROSSMATCH)

## 2020-12-16 LAB — HEMOGLOBIN AND HEMATOCRIT, BLOOD
HCT: 27.2 % — ABNORMAL LOW (ref 39.0–52.0)
Hemoglobin: 8.7 g/dL — ABNORMAL LOW (ref 13.0–17.0)

## 2020-12-16 MED ORDER — PIPERACILLIN-TAZOBACTAM 3.375 G IVPB
3.3750 g | Freq: Three times a day (TID) | INTRAVENOUS | Status: DC
  Administered 2020-12-16 – 2020-12-19 (×9): 3.375 g via INTRAVENOUS
  Filled 2020-12-16 (×10): qty 50

## 2020-12-16 MED ORDER — CHLORHEXIDINE GLUCONATE CLOTH 2 % EX PADS
6.0000 | MEDICATED_PAD | Freq: Every day | CUTANEOUS | Status: DC
Start: 2020-12-17 — End: 2020-12-19
  Administered 2020-12-17 – 2020-12-18 (×2): 6 via TOPICAL

## 2020-12-16 MED ORDER — MUPIROCIN 2 % EX OINT
1.0000 "application " | TOPICAL_OINTMENT | Freq: Two times a day (BID) | CUTANEOUS | Status: DC
  Administered 2020-12-16 – 2020-12-19 (×6): 1 via NASAL
  Filled 2020-12-16 (×3): qty 22

## 2020-12-16 MED ORDER — SODIUM CHLORIDE 0.9% IV SOLUTION: Freq: Once | INTRAVENOUS | Status: AC

## 2020-12-16 NOTE — H&P (View-Only) (Signed)
  Progress Note    12/16/2020 7:29 AM 1 Day Post-Op  Subjective:  felt a little lightheaded this am.  Afebrile HR 50's-70's afib 169'C-789'F systolic 81% [id number]  Vitals:   12/16/20 0200 12/16/20 0407  BP: 115/61 (!) 116/57  Pulse: (!) 57 61  Resp: 20 17  Temp:  98.4 F (36.9 C)  SpO2: 98% 98%    Physical Exam: General:  no distress Lungs:  non labored Incisions:  left groin is soft without hematoma Extremities:  palpable left PT pulse and brisk right PT doppler signal     CBC    Component Value Date/Time   WBC 8.4 12/16/2020 0123   RBC 3.01 (L) 12/16/2020 0123   HGB 7.2 (L) 12/16/2020 0123   HGB 12.3 (L) 05/30/2017 1531   HCT 22.8 (L) 12/16/2020 0123   HCT 37.7 05/30/2017 1531   PLT 276 12/16/2020 0123   PLT 212 05/30/2017 1531   MCV 75.7 (L) 12/16/2020 0123   MCV 86 05/30/2017 1531   MCH 23.9 (L) 12/16/2020 0123   MCHC 31.6 12/16/2020 0123   RDW 18.6 (H) 12/16/2020 0123   RDW 16.0 (H) 05/30/2017 1531   LYMPHSABS 0.9 02/02/2020 1603   MONOABS 2.2 (H) 02/02/2020 1603   EOSABS 0.1 02/02/2020 1603   BASOSABS 0.0 02/02/2020 1603    BMET    Component Value Date/Time   NA 135 12/16/2020 0123   NA 139 05/30/2017 1531   K 3.9 12/16/2020 0123   CL 102 12/16/2020 0123   CO2 23 12/16/2020 0123   GLUCOSE 138 (H) 12/16/2020 0123   BUN 19 12/16/2020 0123   BUN 13 05/30/2017 1531   CREATININE 1.92 (H) 12/16/2020 0123   CREATININE 1.00 01/04/2018 1401   CALCIUM 8.0 (L) 12/16/2020 0123   GFRNONAA 34 (L) 12/16/2020 0123   GFRNONAA >60 01/04/2018 1401   GFRAA >60 12/04/2019 1416   GFRAA >60 01/04/2018 1401    INR    Component Value Date/Time   INR 1.3 (H) 09/26/2019 0826     Intake/Output Summary (Last 24 hours) at 12/16/2020 0729 Last data filed at 12/16/2020 0700 Gross per 24 hour  Intake 2142.85 ml  Output 1600 ml  Net 542.85 ml     Assessment/Plan:  87 y.o. male is s/p:  Aortogram with stenting of right SFA  1 Day Post-Op   -pt with  brisk right PT doppler signal and palpable left PT pulse -wound on dorsum of right foot looks better than previous picture with improvement of erythema.  Discussed with pt and wife he is still at risk of this not healing.  Continue wet to dry dressing changes.  -acute blood loss anemia-pt states he has had diarrhea off and on for a couple of months and says that it is sometimes black.  Will order hemoccult and give one unit of blood and check labs in am -creatinine was up yesterday at 1.90 and relatively unchanged today at 1.92.  He received 165cc contrast.  Continue hydration today. -DVT prophylaxis:  Eliquis -pt also on asa/plavix/statin   Leontine Locket, PA-C [facility] [phone] 12/16/2020 7:29 AM  I agree witht eh above.  I discussed going to the OR tomorrow for I&D of the right foot wound in attempt th save the foot. We discussed that he is at risk for amputation  [name]

## 2020-12-16 NOTE — Progress Notes (Addendum)
  Progress Note    12/16/2020 7:29 AM 1 Day Post-Op  Subjective:  felt a little lightheaded this am.  Afebrile HR 50's-70's afib 545'G-256'L systolic 89% [id number]  Vitals:   12/16/20 0200 12/16/20 0407  BP: 115/61 (!) 116/57  Pulse: (!) 57 61  Resp: 20 17  Temp:  98.4 F (36.9 C)  SpO2: 98% 98%    Physical Exam: General:  no distress Lungs:  non labored Incisions:  left groin is soft without hematoma Extremities:  palpable left PT pulse and brisk right PT doppler signal     CBC    Component Value Date/Time   WBC 8.4 12/16/2020 0123   RBC 3.01 (L) 12/16/2020 0123   HGB 7.2 (L) 12/16/2020 0123   HGB 12.3 (L) 05/30/2017 1531   HCT 22.8 (L) 12/16/2020 0123   HCT 37.7 05/30/2017 1531   PLT 276 12/16/2020 0123   PLT 212 05/30/2017 1531   MCV 75.7 (L) 12/16/2020 0123   MCV 86 05/30/2017 1531   MCH 23.9 (L) 12/16/2020 0123   MCHC 31.6 12/16/2020 0123   RDW 18.6 (H) 12/16/2020 0123   RDW 16.0 (H) 05/30/2017 1531   LYMPHSABS 0.9 02/02/2020 1603   MONOABS 2.2 (H) 02/02/2020 1603   EOSABS 0.1 02/02/2020 1603   BASOSABS 0.0 02/02/2020 1603    BMET    Component Value Date/Time   NA 135 12/16/2020 0123   NA 139 05/30/2017 1531   K 3.9 12/16/2020 0123   CL 102 12/16/2020 0123   CO2 23 12/16/2020 0123   GLUCOSE 138 (H) 12/16/2020 0123   BUN 19 12/16/2020 0123   BUN 13 05/30/2017 1531   CREATININE 1.92 (H) 12/16/2020 0123   CREATININE 1.00 01/04/2018 1401   CALCIUM 8.0 (L) 12/16/2020 0123   GFRNONAA 34 (L) 12/16/2020 0123   GFRNONAA >60 01/04/2018 1401   GFRAA >60 12/04/2019 1416   GFRAA >60 01/04/2018 1401    INR    Component Value Date/Time   INR 1.3 (H) 09/26/2019 0826     Intake/Output Summary (Last 24 hours) at 12/16/2020 0729 Last data filed at 12/16/2020 0700 Gross per 24 hour  Intake 2142.85 ml  Output 1600 ml  Net 542.85 ml     Assessment/Plan:  87 y.o. male is s/p:  Aortogram with stenting of right SFA  1 Day Post-Op   -pt with  brisk right PT doppler signal and palpable left PT pulse -wound on dorsum of right foot looks better than previous picture with improvement of erythema.  Discussed with pt and wife he is still at risk of this not healing.  Continue wet to dry dressing changes.  -acute blood loss anemia-pt states he has had diarrhea off and on for a couple of months and says that it is sometimes black.  Will order hemoccult and give one unit of blood and check labs in am -creatinine was up yesterday at 1.90 and relatively unchanged today at 1.92.  He received 165cc contrast.  Continue hydration today. -DVT prophylaxis:  Eliquis -pt also on asa/plavix/statin   Leontine Locket, PA-C [facility] [phone] 12/16/2020 7:29 AM  I agree witht eh above.  I discussed going to the OR tomorrow for I&D of the right foot wound in attempt th save the foot. We discussed that he is at risk for amputation  [name]

## 2020-12-16 NOTE — Progress Notes (Signed)
Unable to measure the wound this am, vascular PA changed dressing, will pass off in report to night shift.   Chrisandra Carota, RN 12/16/2020 7:52 AM

## 2020-12-16 NOTE — Consult Note (Signed)
[place] Nurse Consult Note: Reason for Consult:Right dorsal foot ulceration. Patient is s/p transmetatarsal head amputation, has known PAD. S/P vascular intervention to RLE yesterday with Dr. Trula Slade. Ulcer presented as blister that ruptured and then presented with progressive erythema. Wound type:PAD Pressure Injury POA: N/A Measurement: To be provided by Bedside RN and documented on Nursing Flow Sheet today prior to performing wound care Wound bed:red, moist Drainage (amount, consistency, odor) small serous Periwound: erythema Dressing procedure/placement/frequency: Topical care while in house will be to wash daily, pat dry and cover with an antimicrobial nonadherent dressing (xeroform) topped with dry dressings and secure. Heels are to be floated to prevent pressure injury.Patient will continue to be followed by Vascular Surgery post discharge. [place] nursing team will not follow, but will remain available to this patient, the nursing and medical teams.  Please re-consult if needed. Thanks, Maudie Flakes, MSN, RN, [place], Arther Abbott  Pager# [phone]

## 2020-12-16 NOTE — Progress Notes (Signed)
Notified by microbiology, MRSA was detected in nasal swap, standing orders initiated.   Chrisandra Carota, RN 12/16/2020

## 2020-12-16 NOTE — Progress Notes (Signed)
Plan of care is reviewed. Pt has been progressing. Pt is alert and fully oriented, no chills, remaining afebrile, has no immediate distress noted. He is hemodynamically stable. Atrial fibrillation on the monitor, heart rate is under controlled. BP is within normal limits. SPO2 on room air 88% at night while he was sleeping. O2 NCL 2 LPM is given. Lungs are clear auscultated bilaterally. Denies shortness of breath. Left groin is negative for hematoma. Pt has good signals on DP and PT pulses bilaterally via doppler.  Right foot with old open non healing wound has serous drainage. Moist-to-dry dressing changed. We put wound care consult for evaluation and treatment plan. Pt is tolerated pain well. Pt's wife stays at overnight at bedside.  We will continue to monitor.  Kennyth Lose, RN

## 2020-12-17 ENCOUNTER — Encounter (HOSPITAL_COMMUNITY)
Admission: RE | Disposition: A | Discharge status: Home Health | Payer: Self-pay | Source: Ambulatory Visit | Attending: Surgery

## 2020-12-17 ENCOUNTER — Observation Stay (HOSPITAL_COMMUNITY): Payer: Medicare HMO | Admitting: Certified Registered Nurse Anesthetist

## 2020-12-17 ENCOUNTER — Encounter (HOSPITAL_COMMUNITY): Payer: Self-pay | Admitting: Surgery

## 2020-12-17 DIAGNOSIS — I70235 Atherosclerosis of native arteries of right leg with ulceration of other part of foot: Secondary | ICD-10-CM | POA: Diagnosis not present

## 2020-12-17 DIAGNOSIS — E039 Hypothyroidism, unspecified: Secondary | ICD-10-CM | POA: Diagnosis present

## 2020-12-17 DIAGNOSIS — Z955 Presence of coronary angioplasty implant and graft: Secondary | ICD-10-CM | POA: Diagnosis not present

## 2020-12-17 DIAGNOSIS — Z96653 Presence of artificial knee joint, bilateral: Secondary | ICD-10-CM | POA: Diagnosis present

## 2020-12-17 DIAGNOSIS — Z823 Family history of stroke: Secondary | ICD-10-CM | POA: Diagnosis not present

## 2020-12-17 DIAGNOSIS — I70261 Atherosclerosis of native arteries of extremities with gangrene, right leg: Secondary | ICD-10-CM | POA: Diagnosis present

## 2020-12-17 DIAGNOSIS — Z8249 Family history of ischemic heart disease and other diseases of the circulatory system: Secondary | ICD-10-CM | POA: Diagnosis not present

## 2020-12-17 DIAGNOSIS — E782 Mixed hyperlipidemia: Secondary | ICD-10-CM | POA: Diagnosis present

## 2020-12-17 DIAGNOSIS — G4700 Insomnia, unspecified: Secondary | ICD-10-CM | POA: Diagnosis present

## 2020-12-17 DIAGNOSIS — Z833 Family history of diabetes mellitus: Secondary | ICD-10-CM | POA: Diagnosis not present

## 2020-12-17 DIAGNOSIS — Z7901 Long term (current) use of anticoagulants: Secondary | ICD-10-CM | POA: Diagnosis not present

## 2020-12-17 DIAGNOSIS — D62 Acute posthemorrhagic anemia: Secondary | ICD-10-CM

## 2020-12-17 DIAGNOSIS — E11621 Type 2 diabetes mellitus with foot ulcer: Secondary | ICD-10-CM | POA: Diagnosis present

## 2020-12-17 DIAGNOSIS — L97511 Non-pressure chronic ulcer of other part of right foot limited to breakdown of skin: Secondary | ICD-10-CM | POA: Diagnosis present

## 2020-12-17 DIAGNOSIS — I5032 Chronic diastolic (congestive) heart failure: Secondary | ICD-10-CM | POA: Diagnosis present

## 2020-12-17 DIAGNOSIS — Z820 Family history of epilepsy and other diseases of the nervous system: Secondary | ICD-10-CM | POA: Diagnosis not present

## 2020-12-17 DIAGNOSIS — I110 Hypertensive heart disease with heart failure: Secondary | ICD-10-CM | POA: Diagnosis present

## 2020-12-17 DIAGNOSIS — Z981 Arthrodesis status: Secondary | ICD-10-CM | POA: Diagnosis not present

## 2020-12-17 DIAGNOSIS — I480 Paroxysmal atrial fibrillation: Secondary | ICD-10-CM | POA: Diagnosis present

## 2020-12-17 DIAGNOSIS — M79671 Pain in right foot: Secondary | ICD-10-CM | POA: Diagnosis present

## 2020-12-17 DIAGNOSIS — E1152 Type 2 diabetes mellitus with diabetic peripheral angiopathy with gangrene: Secondary | ICD-10-CM | POA: Diagnosis present

## 2020-12-17 DIAGNOSIS — J449 Chronic obstructive pulmonary disease, unspecified: Secondary | ICD-10-CM | POA: Diagnosis present

## 2020-12-17 DIAGNOSIS — I2510 Atherosclerotic heart disease of native coronary artery without angina pectoris: Secondary | ICD-10-CM | POA: Diagnosis present

## 2020-12-17 DIAGNOSIS — Z20822 Contact with and (suspected) exposure to covid-19: Secondary | ICD-10-CM | POA: Diagnosis present

## 2020-12-17 DIAGNOSIS — Z825 Family history of asthma and other chronic lower respiratory diseases: Secondary | ICD-10-CM | POA: Diagnosis not present

## 2020-12-17 DIAGNOSIS — L97519 Non-pressure chronic ulcer of other part of right foot with unspecified severity: Secondary | ICD-10-CM | POA: Diagnosis present

## 2020-12-17 HISTORY — PX: INCISION AND DRAINAGE: SHX5863

## 2020-12-17 HISTORY — PX: APPLICATION OF WOUND VAC: SHX5189

## 2020-12-17 LAB — BASIC METABOLIC PANEL
Anion gap: 10 (ref 5–15)
BUN: 22 mg/dL (ref 8–23)
CO2: 24 mmol/L (ref 22–32)
Calcium: 8.2 mg/dL — ABNORMAL LOW (ref 8.9–10.3)
Chloride: 101 mmol/L (ref 98–111)
Creatinine, Ser: 1.87 mg/dL — ABNORMAL HIGH (ref 0.61–1.24)
GFR, Estimated: 35 mL/min — ABNORMAL LOW (ref >60)
Glucose, Bld: 202 mg/dL — ABNORMAL HIGH (ref 70–99)
Potassium: 4.5 mmol/L (ref 3.5–5.1)
Sodium: 135 mmol/L (ref 135–145)

## 2020-12-17 LAB — TYPE AND SCREEN
ABO/RH(D): O POS
Antibody Screen: NEGATIVE
Unit division: 0

## 2020-12-17 LAB — CBC
HCT: 26.6 % — ABNORMAL LOW (ref 39.0–52.0)
Hemoglobin: 8.6 g/dL — ABNORMAL LOW (ref 13.0–17.0)
MCH: 24.7 pg — ABNORMAL LOW (ref 26.0–34.0)
MCHC: 32.3 g/dL (ref 30.0–36.0)
MCV: 76.4 fL — ABNORMAL LOW (ref 80.0–100.0)
Platelets: 292 10*3/uL (ref 150–400)
RBC: 3.48 MIL/uL — ABNORMAL LOW (ref 4.22–5.81)
RDW: 18.7 % — ABNORMAL HIGH (ref 11.5–15.5)
WBC: 9.7 10*3/uL (ref 4.0–10.5)
nRBC: 0 % (ref 0.0–0.2)

## 2020-12-17 LAB — BPAM RBC
Blood Product Expiration Date: 202210222359
ISSUE DATE / TIME: 202209211034
Unit Type and Rh: 5100

## 2020-12-17 LAB — GLUCOSE, CAPILLARY
Glucose-Capillary: 165 mg/dL — ABNORMAL HIGH (ref 70–99)
Glucose-Capillary: 153 mg/dL — ABNORMAL HIGH (ref 70–99)
Glucose-Capillary: 164 mg/dL — ABNORMAL HIGH (ref 70–99)
Glucose-Capillary: 168 mg/dL — ABNORMAL HIGH (ref 70–99)
Glucose-Capillary: 233 mg/dL — ABNORMAL HIGH (ref 70–99)
Glucose-Capillary: 158 mg/dL — ABNORMAL HIGH (ref 70–99)

## 2020-12-17 LAB — SARS CORONAVIRUS 2 BY RT PCR (HOSPITAL ORDER, PERFORMED IN ~~LOC~~ HOSPITAL LAB): SARS Coronavirus 2: NEGATIVE

## 2020-12-17 SURGERY — INCISION AND DRAINAGE
Anesthesia: Monitor Anesthesia Care | Site: Foot | Laterality: Right

## 2020-12-17 MED ORDER — LACTATED RINGERS IV SOLN: INTRAVENOUS | Status: DC | PRN

## 2020-12-17 MED ORDER — FENTANYL CITRATE (PF) 250 MCG/5ML IJ SOLN
INTRAMUSCULAR | Status: AC
  Filled 2020-12-17: qty 5

## 2020-12-17 MED ORDER — PROPOFOL 10 MG/ML IV BOLUS
INTRAVENOUS | Status: AC
  Filled 2020-12-17: qty 20

## 2020-12-17 MED ORDER — ONDANSETRON HCL 4 MG/2ML IJ SOLN
INTRAMUSCULAR | Status: AC
  Filled 2020-12-17: qty 2

## 2020-12-17 MED ORDER — LACTATED RINGERS IV SOLN: INTRAVENOUS | Status: DC

## 2020-12-17 MED ORDER — PHENYLEPHRINE 40 MCG/ML (10ML) SYRINGE FOR IV PUSH (FOR BLOOD PRESSURE SUPPORT)
PREFILLED_SYRINGE | INTRAVENOUS | Status: AC
  Filled 2020-12-17: qty 10

## 2020-12-17 MED ORDER — PROPOFOL 500 MG/50ML IV EMUL
INTRAVENOUS | Status: DC | PRN
  Administered 2020-12-17: 50 ug/kg/min via INTRAVENOUS

## 2020-12-17 MED ORDER — PHENYLEPHRINE 40 MCG/ML (10ML) SYRINGE FOR IV PUSH (FOR BLOOD PRESSURE SUPPORT)
PREFILLED_SYRINGE | INTRAVENOUS | Status: DC | PRN
  Administered 2020-12-17 (×6): 80 ug via INTRAVENOUS

## 2020-12-17 MED ORDER — FENTANYL CITRATE PF 50 MCG/ML IJ SOSY: 50.0000 ug | PREFILLED_SYRINGE | Freq: Once | INTRAMUSCULAR | Status: AC

## 2020-12-17 MED ORDER — ONDANSETRON HCL 4 MG/2ML IJ SOLN
INTRAMUSCULAR | Status: DC | PRN
  Administered 2020-12-17: 4 mg via INTRAVENOUS

## 2020-12-17 MED ORDER — FENTANYL CITRATE (PF) 100 MCG/2ML IJ SOLN
INTRAMUSCULAR | Status: AC
  Administered 2020-12-17: 100 ug
  Filled 2020-12-17: qty 2

## 2020-12-17 MED ORDER — BUPIVACAINE-EPINEPHRINE (PF) 0.5% -1:200000 IJ SOLN
INTRAMUSCULAR | Status: DC | PRN
  Administered 2020-12-17: 25 mL via PERINEURAL

## 2020-12-17 MED ORDER — 0.9 % SODIUM CHLORIDE (POUR BTL) OPTIME
TOPICAL | Status: DC | PRN
  Administered 2020-12-17: 1000 mL

## 2020-12-17 MED ORDER — CHLORHEXIDINE GLUCONATE 0.12 % MT SOLN: 15.0000 mL | Freq: Once | OROMUCOSAL | Status: AC

## 2020-12-17 MED ORDER — PROPOFOL 1000 MG/100ML IV EMUL
INTRAVENOUS | Status: AC
  Filled 2020-12-17: qty 100

## 2020-12-17 MED ORDER — ORAL CARE MOUTH RINSE: 15.0000 mL | Freq: Once | OROMUCOSAL | Status: AC

## 2020-12-17 MED ORDER — APIXABAN 2.5 MG PO TABS
2.5000 mg | ORAL_TABLET | Freq: Two times a day (BID) | ORAL | Status: DC
  Administered 2020-12-18 – 2020-12-19 (×3): 2.5 mg via ORAL
  Filled 2020-12-17 (×3): qty 1

## 2020-12-17 MED ORDER — FENTANYL CITRATE (PF) 100 MCG/2ML IJ SOLN
INTRAMUSCULAR | Status: DC | PRN
  Administered 2020-12-17: 25 ug via INTRAVENOUS

## 2020-12-17 MED ORDER — EPHEDRINE SULFATE-NACL 50-0.9 MG/10ML-% IV SOSY
PREFILLED_SYRINGE | INTRAVENOUS | Status: DC | PRN
  Administered 2020-12-17: 10 mg via INTRAVENOUS

## 2020-12-17 MED ORDER — MIDAZOLAM HCL 2 MG/2ML IJ SOLN
INTRAMUSCULAR | Status: AC
  Filled 2020-12-17: qty 2

## 2020-12-17 MED ORDER — CHLORHEXIDINE GLUCONATE 0.12 % MT SOLN
OROMUCOSAL | Status: AC
  Administered 2020-12-17: 15 mL via OROMUCOSAL
  Filled 2020-12-17: qty 15

## 2020-12-17 SURGICAL SUPPLY — 36 items
BAG COUNTER SPONGE SURGICOUNT (BAG) ×3 IMPLANT
BAG SPNG CNTER NS LX DISP (BAG) ×2
BNDG GAUZE ELAST 4 BULKY (GAUZE/BANDAGES/DRESSINGS) ×1 IMPLANT
CANISTER SUCT 3000ML PPV (MISCELLANEOUS) ×3 IMPLANT
CANISTER WOUND CARE 500ML ATS (WOUND CARE) ×1 IMPLANT
CNTNR URN SCR LID CUP LEK RST (MISCELLANEOUS) IMPLANT
CONT SPEC 4OZ STRL OR WHT (MISCELLANEOUS) ×3
COVER SURGICAL LIGHT HANDLE (MISCELLANEOUS) ×3 IMPLANT
DRAPE HALF SHEET 40X57 (DRAPES) IMPLANT
DRAPE U-SHAPE 76X120 STRL (DRAPES) IMPLANT
DRSG EMULSION OIL 3X3 NADH (GAUZE/BANDAGES/DRESSINGS) ×1 IMPLANT
DRSG VAC ATS SM SENSATRAC (GAUZE/BANDAGES/DRESSINGS) ×1 IMPLANT
ELECT REM PT RETURN 9FT ADLT (ELECTROSURGICAL) ×3
ELECTRODE REM PT RTRN 9FT ADLT (ELECTROSURGICAL) ×2 IMPLANT
GAUZE SPONGE 4X4 12PLY STRL (GAUZE/BANDAGES/DRESSINGS) ×3 IMPLANT
GLOVE SRG 8 PF TXTR STRL LF DI (GLOVE) ×2 IMPLANT
GLOVE SURG POLYISO LF SZ7.5 (GLOVE) ×3 IMPLANT
GLOVE SURG UNDER POLY LF SZ8 (GLOVE) ×3
GOWN STRL REUS W/ TWL LRG LVL3 (GOWN DISPOSABLE) ×4 IMPLANT
GOWN STRL REUS W/ TWL XL LVL3 (GOWN DISPOSABLE) ×2 IMPLANT
GOWN STRL REUS W/TWL LRG LVL3 (GOWN DISPOSABLE) ×6
GOWN STRL REUS W/TWL XL LVL3 (GOWN DISPOSABLE) ×3
HANDPIECE INTERPULSE COAX TIP (DISPOSABLE)
IV NS IRRIG 3000ML ARTHROMATIC (IV SOLUTION) ×3 IMPLANT
KIT BASIN OR (CUSTOM PROCEDURE TRAY) ×3 IMPLANT
KIT TURNOVER KIT B (KITS) ×3 IMPLANT
NS IRRIG 1000ML POUR BTL (IV SOLUTION) ×3 IMPLANT
PACK CV ACCESS (CUSTOM PROCEDURE TRAY) IMPLANT
PACK GENERAL/GYN (CUSTOM PROCEDURE TRAY) ×3 IMPLANT
PACK UNIVERSAL I (CUSTOM PROCEDURE TRAY) ×3 IMPLANT
SET HNDPC FAN SPRY TIP SCT (DISPOSABLE) IMPLANT
SPONGE T-LAP 12X12 ~~LOC~~+RFID (SPONGE) ×1 IMPLANT
TOWEL GREEN STERILE (TOWEL DISPOSABLE) ×3 IMPLANT
TUBE CONNECTING 12X1/4 (SUCTIONS) ×1 IMPLANT
WATER STERILE IRR 1000ML POUR (IV SOLUTION) ×3 IMPLANT
YANKAUER SUCT BULB TIP NO VENT (SUCTIONS) ×1 IMPLANT

## 2020-12-17 NOTE — Anesthesia Preprocedure Evaluation (Signed)
Anesthesia Evaluation  Patient identified by MRN, date of birth, ID band Patient awake    Reviewed: Allergy & Precautions, H&P , NPO status , Patient's Chart, lab work & pertinent test results  Airway Mallampati: II   Neck ROM: full    Dental   Pulmonary sleep apnea , COPD, former smoker,    breath sounds clear to auscultation       Cardiovascular hypertension, + CAD, + Peripheral Vascular Disease and +CHF  + dysrhythmias Atrial Fibrillation  Rhythm:regular Rate:Normal     Neuro/Psych    GI/Hepatic hiatal hernia, GERD  ,  Endo/Other  diabetes, Type 2Hypothyroidism   Renal/GU Renal InsufficiencyRenal disease     Musculoskeletal  (+) Arthritis ,   Abdominal   Peds  Hematology  (+) Blood dyscrasia, anemia ,   Anesthesia Other Findings   Reproductive/Obstetrics                             Anesthesia Physical Anesthesia Plan  ASA: 3  Anesthesia Plan: General   Post-op Pain Management:    Induction: Intravenous  PONV Risk Score and Plan: 2 and Ondansetron, Dexamethasone and Treatment may vary due to age or medical condition  Airway Management Planned: LMA  Additional Equipment:   Intra-op Plan:   Post-operative Plan: Extubation in OR  Informed Consent: I have reviewed the patients History and Physical, chart, labs and discussed the procedure including the risks, benefits and alternatives for the proposed anesthesia with the patient or authorized representative who has indicated his/her understanding and acceptance.     Dental advisory given  Plan Discussed with: CRNA, Anesthesiologist and Surgeon  Anesthesia Plan Comments:         Anesthesia Quick Evaluation

## 2020-12-17 NOTE — Anesthesia Procedure Notes (Signed)
Procedure Name: MAC Date/Time: 12/17/2020 10:15 AM Performed by: Harden Mo, CRNA Pre-anesthesia Checklist: Patient identified, Emergency Drugs available, Suction available and Patient being monitored Patient Re-evaluated:Patient Re-evaluated prior to induction Oxygen Delivery Method: Simple face mask Preoxygenation: Pre-oxygenation with 100% oxygen Induction Type: IV induction Placement Confirmation: positive ETCO2 and breath sounds checked- equal and bilateral Dental Injury: Teeth and Oropharynx as per pre-operative assessment

## 2020-12-17 NOTE — Interval H&P Note (Signed)
History and Physical Interval Note:  12/17/2020 8:37 AM  Mike Wright  has presented today for surgery, with the diagnosis of NON-HEALING WOUND.  The various methods of treatment have been discussed with the patient and family. After consideration of risks, benefits and other options for treatment, the patient has consented to  Procedure(s): INCISION AND DRAINAGE OF RIGHT FOOT (Right) as a surgical intervention.  The patient's history has been reviewed, patient examined, no change in status, stable for surgery.  I have reviewed the patient's chart and labs.  Questions were answered to the patient's satisfaction.     [name]

## 2020-12-17 NOTE — Anesthesia Procedure Notes (Signed)
Anesthesia Regional Block: Popliteal block   Pre-Anesthetic Checklist: , timeout performed,  Correct Patient, Correct Site, Correct Laterality,  Correct Procedure, Correct Position, site marked,  Risks and benefits discussed,  Surgical consent,  Pre-op evaluation,  At surgeon's request and post-op pain management  Laterality: Right  Prep: chloraprep       Needles:  Injection technique: Single-shot  Needle Type: Echogenic Stimulator Needle          Additional Needles:   Procedures:, nerve stimulator,,,,,     Nerve Stimulator or Paresthesia:  Response: plantar flexion of foot, 0.45 mA  Additional Responses:   Narrative:  Start time: 12/17/2020 9:37 AM End time: 12/17/2020 9:45 AM Injection made incrementally with aspirations every 5 mL.  Performed by: Personally  Anesthesiologist: Albertha Ghee, MD  Additional Notes: Functioning IV was confirmed and monitors were applied.  A 28mm 21ga Arrow echogenic stimulator needle was used. Sterile prep and drape,hand hygiene and sterile gloves were used.  Negative aspiration and negative test dose prior to incremental administration of local anesthetic. The patient tolerated the procedure well.  Ultrasound guidance: relevent anatomy identified, needle position confirmed, local anesthetic spread visualized around nerve(s), vascular puncture avoided.  Image printed for medical record.

## 2020-12-17 NOTE — Transfer of Care (Signed)
Immediate Anesthesia Transfer of Care Note  Patient: Mike Wright  Procedure(s) Performed: INCISION AND DRAINAGE OF RIGHT FOOT (Right) APPLICATION OF WOUND VAC (Foot)  Patient Location: PACU  Anesthesia Type:MAC and Regional  Level of Consciousness: awake, alert  and oriented  Airway & Oxygen Therapy: Patient Spontanous Breathing  Post-op Assessment: Report given to RN and Post -op Vital signs reviewed and stable  Post vital signs: Reviewed and stable  Last Vitals:  Vitals Value Taken Time  BP 114/53 12/17/20 1059  Temp    Pulse 73 12/17/20 1100  Resp 22 12/17/20 1100  SpO2 97 % 12/17/20 1100  Vitals shown include unvalidated device data.  Last Pain:  Vitals:   12/17/20 0856  TempSrc: Oral  PainSc:          Complications: No notable events documented.

## 2020-12-18 ENCOUNTER — Encounter (HOSPITAL_COMMUNITY): Payer: Self-pay | Admitting: Surgery

## 2020-12-18 DIAGNOSIS — I70235 Atherosclerosis of native arteries of right leg with ulceration of other part of foot: Secondary | ICD-10-CM

## 2020-12-18 DIAGNOSIS — Z9889 Other specified postprocedural states: Secondary | ICD-10-CM

## 2020-12-18 LAB — GLUCOSE, CAPILLARY
Glucose-Capillary: 184 mg/dL — ABNORMAL HIGH (ref 70–99)
Glucose-Capillary: 206 mg/dL — ABNORMAL HIGH (ref 70–99)
Glucose-Capillary: 200 mg/dL — ABNORMAL HIGH (ref 70–99)
Glucose-Capillary: 253 mg/dL — ABNORMAL HIGH (ref 70–99)
Glucose-Capillary: 168 mg/dL — ABNORMAL HIGH (ref 70–99)

## 2020-12-18 NOTE — Progress Notes (Addendum)
  Progress Note    12/18/2020 8:14 AM 1 Day Post-Op  Subjective:  sleeping   Vitals:   12/18/20 0759 12/18/20 0801  BP:  127/64  Pulse:  (!) 55  Resp: (P) 20 19  Temp:  (!) 97.5 F (36.4 C)  SpO2:  90%   Physical Exam: Cardiac:  regular Lungs:  non labored Incisions:  right foot dressings c/d/I, wound VAC to suction. 100cc bloody output in canister Extremities:  well perfused and warm with doppler PT bilaterally Neurologic: sleeping  CBC    Component Value Date/Time   WBC 9.7 12/17/2020 0045   RBC 3.48 (L) 12/17/2020 0045   HGB 8.6 (L) 12/17/2020 0045   HGB 12.3 (L) 05/30/2017 1531   HCT 26.6 (L) 12/17/2020 0045   HCT 37.7 05/30/2017 1531   PLT 292 12/17/2020 0045   PLT 212 05/30/2017 1531   MCV 76.4 (L) 12/17/2020 0045   MCV 86 05/30/2017 1531   MCH 24.7 (L) 12/17/2020 0045   MCHC 32.3 12/17/2020 0045   RDW 18.7 (H) 12/17/2020 0045   RDW 16.0 (H) 05/30/2017 1531   LYMPHSABS 0.9 02/02/2020 1603   MONOABS 2.2 (H) 02/02/2020 1603   EOSABS 0.1 02/02/2020 1603   BASOSABS 0.0 02/02/2020 1603    BMET    Component Value Date/Time   NA 135 12/17/2020 0045   NA 139 05/30/2017 1531   K 4.5 12/17/2020 0045   CL 101 12/17/2020 0045   CO2 24 12/17/2020 0045   GLUCOSE 202 (H) 12/17/2020 0045   BUN 22 12/17/2020 0045   BUN 13 05/30/2017 1531   CREATININE 1.87 (H) 12/17/2020 0045   CREATININE 1.00 01/04/2018 1401   CALCIUM 8.2 (L) 12/17/2020 0045   GFRNONAA 35 (L) 12/17/2020 0045   GFRNONAA >60 01/04/2018 1401   GFRAA >60 12/04/2019 1416   GFRAA >60 01/04/2018 1401    INR    Component Value Date/Time   INR 1.3 (H) 09/26/2019 0826     Intake/Output Summary (Last 24 hours) at 12/18/2020 0814 Last data filed at 12/18/2020 0553 Gross per 24 hour  Intake 1323.61 ml  Output 2500 ml  Net -1176.39 ml     Assessment/Plan:  87 y.o. male is s/p  #1: Incision and debridement of right foot including skin, soft tissue, muscle, and tendon. #2: Placement of wound  VAC (6 x 6 x 0.5 cm) 1 Day Post-Op   Wound VAC in place R foot. Home wound VAC ordered Hemodynamically stable Final surgical cultures pending. Abundant Gram + cocci Continue Vanc and Zosyn. Will transition to po once sensitivities available Possible D/c later today  DVT prophylaxis: Eliquis   Karoline Caldwell, PA-C [facility] [phone] 12/18/2020 8:14 AM  I agree with the above.  I have seen and evaluated the patient.  We are working on getting him home with a wound VAC.  He will follow-up with me in the office on Monday for a VAC change.  We are working on home health to help with VAC changes at home.  Cultures show staph aureus.  Sensitivities are pending.  We will transition him to the appropriate antibiotic once the sensitivities return.  His Eliquis dose will be adjusted based on pharmacy's recommendation.  He will continue all his other home medications including Plavix.  [name]

## 2020-12-18 NOTE — Consult Note (Signed)
   [facility] Inpatient Consult   12/18/2020  KOICHI PLATTE [birth year] [id number]  [facility] [ACO] Patient: [facility] Medicare   Patient is currently active with [facility] for chronic disease management services.  Patient has been engaged by a [facility].  Our community based plan of care has focused on disease management and community resource support.    Plan: Will follow up with Inpatient Transition Of Care [TOC] team member to make aware that [facility] following.   Of note, [facility] Care Management services does not replace or interfere with any services that are needed or arranged by inpatient [facility] care management team.  For additional questions or referrals please contact:   Natividad Brood, RN BSN [facility] Liaison  [phone] business mobile phone Toll free office [phone]  Fax number: [phone] [email or web address]

## 2020-12-18 NOTE — Progress Notes (Signed)
The plan is to follow-up on his cultures today and transition him to oral antibiotics.  I would like for him to go home with a wound VAC and follow-up on Monday for a VAC change.  [name]

## 2020-12-18 NOTE — Op Note (Addendum)
    Patient name: Mike Wright MRN: [id number] DOB: 15-Nov-1933 Sex: male  12/17/2020 Pre-operative Diagnosis: Right foot ulcer Post-operative diagnosis:  Same Surgeon:  [name] Assistants:  none Procedure:   #1: Incision and debridement of right foot including skin, soft tissue, muscle, and tendon   #2: Placement of wound VAC (6 x 6 x 0.5 cm) Anesthesia: Regional Blood Loss:  minimal Specimens: Wound cultures were taken  Findings: After removing the necrotic tissue, there did appear to be good capillary bleeding  Indications: This is a 87 year old gentleman, status post bilateral transmetatarsal amputations.  He developed a wound on the dorsum of his right foot, likely from a fall.  He underwent angiography yesterday and stenting of the superficial femoral artery.  He comes in today for wound debridement.  Procedure:  The patient was identified in the holding area and taken to [place] [address]  The patient was then placed supine on the table. regional anesthesia was administered.  The patient was prepped and draped in the usual sterile fashion.  A time out was called.  The patient has been receiving scheduled antibiotics.  I first used a #15 blade to debride the necrotic tissue on superficial side of the wound.  This was carried down to healthy tissue.  There was exposed tendon which I sharply debrided with a #10 blade.  I then scraped the wound as well as the wound edges and was able to get to healthy bleeding.  Wound cultures were sent.  Next, the wound was irrigated.  The wound measurements were 6 x 6 x 0.5 cm.  I then placed an Adaptic followed by a wound VAC.  There were no complications.   Disposition: To PACU stable   V. [name], M.D., [facility] [place] Office: [phone] Pager:  [phone]

## 2020-12-18 NOTE — Anesthesia Postprocedure Evaluation (Signed)
Anesthesia Post Note  Patient: Mike Wright  Procedure(s) Performed: INCISION AND DRAINAGE OF RIGHT FOOT (Right) APPLICATION OF WOUND VAC (Foot)     Patient location during evaluation: PACU Anesthesia Type: Regional and MAC Level of consciousness: awake and alert Pain management: pain level controlled Vital Signs Assessment: post-procedure vital signs reviewed and stable Respiratory status: spontaneous breathing, nonlabored ventilation, respiratory function stable and patient connected to nasal cannula oxygen Cardiovascular status: stable and blood pressure returned to baseline Postop Assessment: no apparent nausea or vomiting Anesthetic complications: no   No notable events documented.  Last Vitals:  Vitals:   12/18/20 0007 12/18/20 0500  BP: 129/60 123/69  Pulse: 60 (!) 49  Resp: 20 20  Temp: 36.7 C (!) 36.3 C  SpO2: 97% 99%    Last Pain:  Vitals:   12/18/20 0500  TempSrc: Oral  PainSc:                  Mayer S

## 2020-12-18 NOTE — TOC Initial Note (Addendum)
Transition of Care (TOC) - Initial/Assessment Note  Marvetta Gibbons RN, BSN Transitions of Care Unit 4E- RN Case Manager See Treatment Team for direct phone #    Patient Details  Name: Mike Wright MRN: [id number] Date of Birth: Dec 24, 1933  Transition of Care [facility]) CM/SW Contact:    Dawayne Patricia, RN Phone Number: 12/18/2020, 2:00 PM  Clinical Narrative:                 Noted pt has home wound VAC need for transition home, [employer] home wound VAC form placed on shadow chart for signature- vascular to come and sign.   CM in to speak with pt and wife at the bedside for transition needs. Pt will need HHRN order placed for Home wound VAC drsg needs. Discussed with pt and wife HH needs- pt is hopefull to discharge today- explained to pt and wife that home wound VAC would need to be approved and delivered prior to discharge as well as [facility] services confirmed for start of care. Per pt and wife pt has used [facility] in past and would prefer to use them again as first choice- they are concerned that insurance may not cover again due to letter that they received in the mail. Will check with [name] to confirm coverage- List provided Per CMS guidelines from medicare.gov website with star ratings (copy placed in shadow chart) in case alternate is needed.  Patient reports he has all needed DME- they have a friend that will provide transportation home when needed.   [place] form has been signed and faxed to [place] with [place],  1245- received a msg from [place]- due to pt's [facility] HMO plan - insurance Josem Kaufmann will take at least 2-3 business days to get approval- per Olivia Mackie earliest day for approval would be Monday 9/26.  Have reached out to [employer]- spoke with Brenton Grills- [facility] is a preferred provider and they should be able to get approval today and deliver a VAC in the AM.  Have paged and spoken to Vascular regarding using [facility] vs [facility]- and barriers. Vascular is ok with using alternate VAC provider- [facility]  for home [facility] needs. New VAC order form for [employer] has been placed on shadow chart for signature- and will fax to [place] once signed-   1315- call made to [facility] with [name] for [facility] referral and to confirm insurance- after review insurance is in network and referral has been accepted- [name] can do a start of care on Monday- 9/26 if pt is able to get wound VAC over the weekend.   1344- Call made to [facility] with [facility] for home wound vAC referral. Will process for insurance approval once order received with plan for delivery of home VAC in the am (staff will need to change over to home vac at the bedside prior to discharge)  1400- spoke with pt and wife at the bedside to update on transition plan, verified with them that [name] would be able to provide [facility] needs, also explained need to change home VAC agencies due to insurance and delay in approval- pt and wife voiced understanding and agreed with plan. Pt understands he will need to stay overnight until home [facility] approved and delivered.   1530- notified by [employer] that they will be able to deliver home VAC this afternoon - with pending order form signature- Brenton Grills will be here by 5pm to deliver vac to the bedside. Updated pt and wife- per pt he has been able to find  a ride for tomorrow if needed- will have a ride today up till 9pm. Have reached out to attending team again- awaiting return call, [name] has also been updated pt ? D/c today vs tomorrow AM.   1700- [facility] home wound VAC has been delivered to room, per Vascular pt will come to office for drsg change on Monday and will need to bring supplies- have notified [facility] that [facility] for home will be Wed. 9/28. Pt and wife aware to bring supplies to office visit on Monday. Spoke with bedside RN regarding changing over to home VAC as new track/tubing will have to be applied to current drsg.  Vascular to f/u with pt regarding discharge for today vs in the AM    Expected Discharge Plan: [place] Barriers to Discharge: Equipment Delay, Insurance Authorization   Patient Goals and CMS Choice Patient states their goals for this hospitalization and ongoing recovery are:: return home CMS Medicare.gov Compare Post Acute Care list provided to:: Patient Choice offered to / list presented to : Patient, Spouse  Expected Discharge Plan and Services Expected Discharge Plan: [place]   Discharge Planning Services: CM Consult Post Acute Care Choice: Durable Medical Equipment, Home Health Living arrangements for the past 2 months: Single Family Home                 DME Arranged: Vac DME Agency: [facility] Date DME Agency Contacted: 12/18/20 Time DME Agency Contacted: 0932 Representative spoke with at DME Agency: Brenton Grills HH Arranged: RN [place] Agency: [place] Date [place]: 12/18/20 Time HH Agency Contacted: 74 Representative spoke with at [place]: Tommi Rumps  Prior Living Arrangements/Services Living arrangements for the past 2 months: [place] Lives with:: Spouse Patient language and need for interpreter reviewed:: Yes Do you feel safe going back to the place where you live?: Yes      Need for Family Participation in Patient Care: Yes (Comment) Care giver support system in place?: Yes (comment) Current home services: DME (cane, walker, rollator, wheelchair) Criminal Activity/Legal Involvement Pertinent to Current Situation/Hospitalization: No - Comment as needed  Activities of Daily Living Home Assistive Devices/Equipment: Cane (specify quad or straight) ADL Screening (condition at time of admission) Patient's cognitive ability adequate to safely complete daily activities?: Yes Is the patient deaf or have difficulty hearing?: No Does the patient have difficulty seeing, even when wearing glasses/contacts?: No Does the patient have difficulty concentrating, remembering, or making decisions?: No Patient  able to express need for assistance with ADLs?: Yes Does the patient have difficulty dressing or bathing?: No Independently performs ADLs?: Yes (appropriate for developmental age) Does the patient have difficulty walking or climbing stairs?: No Weakness of Legs: None Weakness of Arms/Hands: None  Permission Sought/Granted Permission sought to share information with : Chartered certified accountant granted to share information with : Yes, Verbal Permission Granted     Permission granted to share info w AGENCY: HH and DME        Emotional Assessment Appearance:: Appears stated age Attitude/Demeanor/Rapport: Engaged Affect (typically observed): Appropriate, Accepting, Pleasant Orientation: : Oriented to Self, Oriented to Place, Oriented to  Time, Oriented to Situation Alcohol / Substance Use: Not Applicable Psych Involvement: No (comment)  Admission diagnosis:  PAD (peripheral artery disease) ([place]) [I73.9] Patient Active Problem List   Diagnosis Date Noted   Hypokalemia 02/03/2020   Cellulitis of right foot    Open wound of foot, right, initial encounter  02/02/2020   Open wound of foot with complication, initial encounter 02/02/2020   Acute urinary retention 07/27/2019   Anaphylaxis 07/26/2019   Gangrene of toe of right foot ([place]) 02/28/2019   Gastrointestinal hemorrhage 12/10/2018   Orthostatic hypotension    Gastroesophageal reflux disease without esophagitis    Chronic respiratory failure with hypoxia (HCC)    Bradycardia 11/26/2018   Abdominal pain, epigastric 11/26/2018   Acute blood loss anemia 11/26/2018   PAD (peripheral artery disease) ([place]) 12/13/2017   Non-healing surgical wound 12/13/2017   Infected surgical wound 12/13/2017   Paroxysmal atrial flutter ([place])    Dyspnea 12/02/2017   Pancytopenia ([place]) 12/02/2017   Coronary artery disease involving native coronary artery of native heart without angina pectoris 11/20/2017   Lower abdominal pain     Nausea without vomiting    Anemia 11/18/2017   Volume overload 11/18/2017   Chest pain 11/17/2017   Gangrene ([place])    Urinary retention 09/16/2017   MRSA bacteremia 09/15/2017   Gangrene of bilateral great toes ([place]) 09/13/2017   COPD (chronic obstructive pulmonary disease) ([place]) 05/19/2017   Iron deficiency anemia due to chronic blood loss    CAD S/P percutaneous coronary angioplasty    Acute on chronic diastolic CHF (congestive heart failure) (HCC)    PSVT (paroxysmal supraventricular tachycardia) (HCC)    PAF (paroxysmal atrial fibrillation) ([place])    Sleep apnea    Type 2 diabetes mellitus ([place])    Status post coronary artery stent placement    Essential hypertension 04/15/2010   Diabetes ([place]) 04/14/2010   PCP:  Monico Blitz, MD Pharmacy:   [facility] - [place], [place] [address] Phone: [phone] Fax: [phone]  [facility], [place] [address] [place] [address] Phone: [phone] Fax: [phone]     Social Determinants of Health (SDOH) Interventions    Readmission Risk Interventions Readmission Risk Prevention Plan 12/18/2020 02/07/2020 07/27/2019  Transportation Screening Complete Complete Complete  PCP or Specialist Appt within 5-7 Days Complete - -  PCP or Specialist Appt within 3-5 Days - Complete -  Home Care Screening Complete - -  Medication Review (RN CM) Complete - -  HRI or [place] - Complete -  Social Work Consult for Recovery Care Planning/Counseling - Complete -  Palliative Care Screening - Not Applicable Not Applicable  Medication Review (RN Care Manager) - Complete Complete  Some recent data might be hidden

## 2020-12-18 NOTE — Progress Notes (Signed)
Pharmacy Antibiotic Note  Mike Wright is a 87 y.o. male admitted on 12/15/2020 with R foot ulcer.  Pharmacy has been consulted for vancomycin and zosyn dosing for wound infection.   9/22 R foot tissue cx - abundant staph aureus- sensitivities pending  Plan: Zosyn 3.375g IV q8h (EI) Vancomycin 750mg  q24h (eAUC 504 using Scr 1.9)   Height: 5\' 7"  (170.2 cm) Weight: 82.7 kg (182 lb 5.1 oz) IBW/kg (Calculated) : 66.1  Temp (24hrs), Avg:97.5 F (36.4 C), Min:97.2 F (36.2 C), Max:98 F (36.7 C)  Recent Labs  Lab 12/15/20 1150 12/16/20 0123 12/17/20 0045  WBC  --  8.4 9.7  CREATININE 1.90* 1.92* 1.87*     Estimated Creatinine Clearance: 29.7 mL/min (A) (by C-G formula based on SCr of 1.87 mg/dL (H)).    Allergies  Allergen Reactions   Bactrim [Sulfamethoxazole-Trimethoprim] Other (See Comments)    Pancytopenia   Codeine Shortness Of Breath   Doxycycline Swelling    Swelling and numbness in lips and face. Swelling improved after stopping. Reports still experiences numbness in bottom lip.    Feraheme [Ferumoxytol] Other (See Comments)    Diaphoretic, chest pain   Heparin Other (See Comments)    +HIT,  Severe bleeding (with heparin drip & large doses), tolerates low doses   Losartan Swelling   Oxycodone Other (See Comments)    "Made me act out of my mind" Mental status changes- hallucinations   Latex Rash    Antimicrobials this admission: Vancomycin 9/20 >>  Zosyn 9/20 >>  Dose adjustments this admission: N/a  Microbiology results: 9/22 R foot tissue - abundant staph aureus- sensitivities pending   Thank you for allowing pharmacy to be a part of this patient's care.  [place] Crane, Roylene Reason, BCCP Clinical Pharmacist  12/18/2020 9:38 AM   [facility] pharmacy phone numbers are listed on [email or web address]

## 2020-12-19 LAB — BASIC METABOLIC PANEL
Anion gap: 9 (ref 5–15)
BUN: 19 mg/dL (ref 8–23)
CO2: 24 mmol/L (ref 22–32)
Calcium: 8.4 mg/dL — ABNORMAL LOW (ref 8.9–10.3)
Chloride: 104 mmol/L (ref 98–111)
Creatinine, Ser: 1.58 mg/dL — ABNORMAL HIGH (ref 0.61–1.24)
GFR, Estimated: 43 mL/min — ABNORMAL LOW (ref >60)
Glucose, Bld: 204 mg/dL — ABNORMAL HIGH (ref 70–99)
Potassium: 3.9 mmol/L (ref 3.5–5.1)
Sodium: 137 mmol/L (ref 135–145)

## 2020-12-19 LAB — GLUCOSE, CAPILLARY: Glucose-Capillary: 244 mg/dL — ABNORMAL HIGH (ref 70–99)

## 2020-12-19 MED ORDER — TRAMADOL HCL 50 MG PO TABS: 50.0000 mg | ORAL_TABLET | Freq: Four times a day (QID) | ORAL | 0 refills | Status: AC | PRN

## 2020-12-19 MED ORDER — APIXABAN 2.5 MG PO TABS: 2.5000 mg | ORAL_TABLET | Freq: Two times a day (BID) | ORAL | 11 refills | Status: DC

## 2020-12-19 MED ORDER — ASPIRIN 81 MG PO TBEC: 81.0000 mg | DELAYED_RELEASE_TABLET | Freq: Every day | ORAL | 11 refills | Status: DC

## 2020-12-19 MED ORDER — CEPHALEXIN 500 MG PO CAPS: 500.0000 mg | ORAL_CAPSULE | Freq: Two times a day (BID) | ORAL | 0 refills | Status: AC

## 2020-12-19 MED ORDER — CLOPIDOGREL BISULFATE 75 MG PO TABS: 75.0000 mg | ORAL_TABLET | Freq: Every day | ORAL | 2 refills | Status: AC

## 2020-12-19 NOTE — Progress Notes (Signed)
Discharged to home with family office visits in place teaching done  

## 2020-12-19 NOTE — TOC Transition Note (Signed)
Transition of Care [facility]) - CM/SW Discharge Note   Patient Details  Name: MANPREET STREY MRN: [id number] Date of Birth: Oct 12, 1933  Transition of Care [facility]) CM/SW Contact:  Pollie Friar, RN Phone Number: 12/19/2020, 9:34 AM   Clinical Narrative:    Patient is discharging home with [facility] services through [place]. Cory with [facility] aware of d/c home today. Wound vac for home is at the bedside.  Pt has transportation home.   Final next level of care: [place] Barriers to Discharge: No Barriers Identified   Patient Goals and CMS Choice Patient states their goals for this hospitalization and ongoing recovery are:: return home CMS Medicare.gov Compare Post Acute Care list provided to:: Patient Choice offered to / list presented to : Patient, Spouse  Discharge Placement                       Discharge Plan and Services   Discharge Planning Services: CM Consult Post Acute Care Choice: Durable Medical Equipment, Home Health          DME Arranged: Vac DME Agency: [facility] Date DME Agency Contacted: 12/18/20 Time DME Agency Contacted: 2010 Representative spoke with at DME Agency: Brenton Grills HH Arranged: RN [place] Agency: [place] Date [place]: 12/18/20 Time [place]: 1300 Representative spoke with at [place]: [place] ([place]) Interventions     Readmission Risk Interventions Readmission Risk Prevention Plan 12/18/2020 02/07/2020 07/27/2019  Transportation Screening Complete Complete Complete  PCP or Specialist Appt within 5-7 Days Complete - -  PCP or Specialist Appt within 3-5 Days - Complete -  Home Care Screening Complete - -  Medication Review (RN CM) Complete - -  HRI or [place] - Complete -  Social Work Consult for [place] Planning/Counseling - Complete -  Palliative Care Screening - Not Applicable Not Applicable  Medication Review Press photographer) -  Complete Complete  Some recent data might be hidden

## 2020-12-19 NOTE — Progress Notes (Addendum)
  Progress Note    12/19/2020 7:58 AM 2 Days Post-Op  Subjective:  no complaints   Vitals:   12/19/20 0000 12/19/20 0336  BP: 122/61 (!) 133/93  Pulse: 67 (!) 51  Resp: 20 (!) 23  Temp:  (!) 97.4 F (36.3 C)  SpO2: 94% 94%   Physical Exam: Cardiac:  regular Lungs:  non labored Incisions:  Right foot with wound vac to suction Extremities:  right leg well perfused and warm. Brisk doppler PT. Monophasic AT/ pero Neurologic: alert and oriented  CBC    Component Value Date/Time   WBC 9.7 12/17/2020 0045   RBC 3.48 (L) 12/17/2020 0045   HGB 8.6 (L) 12/17/2020 0045   HGB 12.3 (L) 05/30/2017 1531   HCT 26.6 (L) 12/17/2020 0045   HCT 37.7 05/30/2017 1531   PLT 292 12/17/2020 0045   PLT 212 05/30/2017 1531   MCV 76.4 (L) 12/17/2020 0045   MCV 86 05/30/2017 1531   MCH 24.7 (L) 12/17/2020 0045   MCHC 32.3 12/17/2020 0045   RDW 18.7 (H) 12/17/2020 0045   RDW 16.0 (H) 05/30/2017 1531   LYMPHSABS 0.9 02/02/2020 1603   MONOABS 2.2 (H) 02/02/2020 1603   EOSABS 0.1 02/02/2020 1603   BASOSABS 0.0 02/02/2020 1603    BMET    Component Value Date/Time   NA 137 12/19/2020 0105   NA 139 05/30/2017 1531   K 3.9 12/19/2020 0105   CL 104 12/19/2020 0105   CO2 24 12/19/2020 0105   GLUCOSE 204 (H) 12/19/2020 0105   BUN 19 12/19/2020 0105   BUN 13 05/30/2017 1531   CREATININE 1.58 (H) 12/19/2020 0105   CREATININE 1.00 01/04/2018 1401   CALCIUM 8.4 (L) 12/19/2020 0105   GFRNONAA 43 (L) 12/19/2020 0105   GFRNONAA >60 01/04/2018 1401   GFRAA >60 12/04/2019 1416   GFRAA >60 01/04/2018 1401    INR    Component Value Date/Time   INR 1.3 (H) 09/26/2019 0826     Intake/Output Summary (Last 24 hours) at 12/19/2020 0758 Last data filed at 12/19/2020 0540 Gross per 24 hour  Intake 168.06 ml  Output 2700 ml  Net -2531.94 ml     Assessment/Plan:  87 y.o. male is s/p  #1: Incision and debridement of right foot including skin, soft tissue, muscle, and tendon. #2: Placement of  wound VAC (6 x 6 x 0.5 cm) 2 Days Post-Op   Wound VAC in place. Will switch over to portable canister at time of d/c Hemodynamically stable Brisk right PT and monophasic AT/ [place] signals  Stable for discharge home today. Home Wound VAC arranged. Follow up appointment scheduled on Monday 9/26. Eliquis 2.5 mg BID adjusted. Cultures grew staph aureus. Prescription for Keflex 500 mg BID sent to patients pharmacy. Sensitivities still pending will adjust if necessary once reported.     Karoline Caldwell, [place] [facility] [phone] 12/19/2020 7:58 AM  I have independently interviewed and examined patient and agree with PA assessment and plan above.   Tsugio Elison C. Donzetta Matters, MD [facility] [place] Office: [phone] Pager: [phone]

## 2020-12-19 NOTE — Discharge Summary (Signed)
[facility] Discharge Summary   Patient ID:  Mike Wright MRN: [id number] DOB/AGE: 1933/09/20 87 y.o.  Admit date: 12/15/2020 Discharge date: 12/19/2020 Date of Surgery: 12/17/2020 Surgeon: Surgeon(s): Serafina Mitchell, MD  Admission Diagnosis: PAD (peripheral artery disease) [facility]) [I73.9]  Discharge Diagnoses:  PAD (peripheral artery disease) ([place]) [I73.9]  Secondary Diagnoses: Past Medical History:  Diagnosis Date   Anemia    a. mild, noted 04/2017.   CAD in native artery    a. [name] 04/2017 s/p DES to D1, DES to prox-mid LAD, DES to prox LAD overlapping the prior stent, LVEF 55-65%.    Chronic diastolic CHF (congestive heart failure) (HCC)    Constipation    COPD (chronic obstructive pulmonary disease) (HCC)    Diabetic ulcer of toe (HCC)    DJD (degenerative joint disease) of cervical spine    Dysrhythmia    AFib   Essential hypertension    GERD (gastroesophageal reflux disease)    History of hiatal hernia    HIT (heparin-induced thrombocytopenia) ([place])    Hypothyroidism    Hypoxia    a. went home on home O2 04/2017.   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   Retinitis    Sinus bradycardia    a. HR 30s-40s in 04/2017 -> diltiazem stopped, metoprolol reduced.   Sleep apnea    "chose not to order CPAP at this time" (05/18/2017)   Type 2 diabetes mellitus ([place])    Wears glasses    Wheezing    a. suspected COPD 04/2017. Former tobacco x 40 years.    Procedure(s): INCISION AND DRAINAGE OF RIGHT FOOT APPLICATION OF WOUND VAC  Discharged Condition: good  HPI: This is a 87 year old gentleman, status post bilateral transmetatarsal amputations.  He developed a wound on the dorsum of his right foot, likely from a fall.  He was indicated for angiography followed  by wound debridement.   Hospital Course:  Mike Wright is a 87 y.o. male was admitted on 12/16/20 and underwent angiography and stenting of his right superficial femoral artery. He was then indicated for a right foot wound debridement. So on 12/17/20 he was taken to the OR and underwent incision and debridement of his right foot including skin, soft tissue, muscle and tendon with placement of wound VAC.  He tolerated the procedure well and was taken to the recovery room in stable condition   Procedure(s): INCISION AND DRAINAGE OF RIGHT FOOT APPLICATION OF WOUND VAC Extubated: POD # 0 Post-op wounds clean, dry, intact or healing well Pt. Ambulating, voiding and taking PO diet without difficulty. Pt pain controlled with PO pain meds. Labs as below Complications:none  Consults: none   Significant Diagnostic Studies: CBC Lab Results  Component Value Date   WBC 9.7 12/17/2020   HGB 8.6 (L) 12/17/2020   HCT 26.6 (L) 12/17/2020   MCV 76.4 (L) 12/17/2020   PLT 292 12/17/2020    BMET    Component Value Date/Time   NA 137 12/19/2020 0105   NA 139 05/30/2017 1531   K 3.9 12/19/2020 0105   CL 104 12/19/2020 0105   CO2 24 12/19/2020 0105   GLUCOSE 204 (H) 12/19/2020 0105   BUN 19 12/19/2020 0105   BUN 13 05/30/2017 1531  CREATININE 1.58 (H) 12/19/2020 0105   CREATININE 1.00 01/04/2018 1401   CALCIUM 8.4 (L) 12/19/2020 0105   GFRNONAA 43 (L) 12/19/2020 0105   GFRNONAA >60 01/04/2018 1401   GFRAA >60 12/04/2019 1416   GFRAA >60 01/04/2018 1401   COAG Lab Results  Component Value Date   INR 1.3 (H) 09/26/2019   INR 1.2 05/29/2019   INR 1.07 12/13/2017     Disposition:  Discharge to :Home Discharge Instructions     Call MD for:  redness, tenderness, or signs of infection (pain, swelling, redness, odor or green/yellow discharge around incision site)   Complete by: As directed    Call MD for:  severe uncontrolled pain   Complete by: As directed    Call MD for:   temperature >100.4   Complete by: As directed    Diet - low sodium heart healthy   Complete by: As directed    Discharge patient   Complete by: As directed    Discharge disposition: 01-Home or Self Care   Discharge patient date: 12/19/2020   Discharge wound care:   Complete by: As directed    Wound VAC will be changed by Home Health Nurse 3x per week. Initial wound VAC change will be performed at your post op visit on 9/27   Driving Restrictions   Complete by: As directed    No driving while taking pain medication   Increase activity slowly   Complete by: As directed       Allergies as of 12/19/2020       Reactions   Bactrim [sulfamethoxazole-trimethoprim] Other (See Comments)   Pancytopenia   Codeine Shortness Of Breath   Doxycycline Swelling   Swelling and numbness in lips and face. Swelling improved after stopping. Reports still experiences numbness in bottom lip.    Feraheme [ferumoxytol] Other (See Comments)   Diaphoretic, chest pain   Heparin Other (See Comments)   +HIT,  Severe bleeding (with heparin drip & large doses), tolerates low doses   Losartan Swelling   Oxycodone Other (See Comments)   "Made me act out of my mind" Mental status changes- hallucinations   Latex Rash        Medication List     STOP taking these medications    sulfamethoxazole-trimethoprim 800-160 MG tablet Commonly known as: BACTRIM DS       TAKE these medications    Accu-Chek Guide w/Device Kit   Accu-Chek Softclix Lancets lancets   acetaminophen 500 MG tablet Commonly known as: TYLENOL Take 1 tablet (500 mg total) by mouth every 6 (six) hours as needed for mild pain or headache. What changed: how much to take   apixaban 2.5 MG Tabs tablet Commonly known as: ELIQUIS Take 1 tablet (2.5 mg total) by mouth 2 (two) times daily. What changed:  medication strength how much to take   aspirin 81 MG EC tablet Take 1 tablet (81 mg total) by mouth daily. Swallow whole.    atorvastatin 80 MG tablet Commonly known as: LIPITOR Take 80 mg by mouth every evening.   B-12 2500 MCG Tabs Take 2,500 mcg by mouth daily.   B-D SINGLE USE SWABS REGULAR Pads   cephALEXin 500 MG capsule Commonly known as: KEFLEX Take 1 capsule (500 mg total) by mouth 2 (two) times daily for 10 days.   clopidogrel 75 MG tablet Commonly known as: PLAVIX Take 1 tablet (75 mg total) by mouth daily with breakfast.   Droplet Insulin Syringe 31G X 5/16" 1 ML  Misc Generic drug: Insulin Syringe-Needle F-681   folic acid 1 MG tablet Commonly known as: FOLVITE Take 1 tablet (1 mg total) by mouth daily.   furosemide 80 MG tablet Commonly known as: LASIX Take 80 mg by mouth See admin instructions. Take 80 mg by mouth in the morning and between 5-6 PM daily   insulin glargine 100 UNIT/ML injection Commonly known as: LANTUS Inject 30-40 Units into the skin See admin instructions. Inject 40 units into the skin in the morning and 30 units at bedtime   ipratropium-albuterol 0.5-2.5 (3) MG/3ML Soln Commonly known as: DUONEB Take 3 mLs by nebulization every other day.   isosorbide mononitrate 30 MG 24 hr tablet Commonly known as: IMDUR Take 1.5 tablets (45 mg total) by mouth in the morning and at bedtime.   lisinopril 5 MG tablet Commonly known as: ZESTRIL Take 0.5 tablets (2.5 mg total) by mouth daily.   meclizine 25 MG tablet Commonly known as: ANTIVERT Take 12.5 mg by mouth daily as needed for dizziness.   metFORMIN 1000 MG tablet Commonly known as: GLUCOPHAGE Take 1,000 mg by mouth 2 (two) times daily.   nitroGLYCERIN 0.4 MG SL tablet Commonly known as: NITROSTAT PLACE 1 TABLET (0.4 MG TOTAL) UNDER THE TONGUE EVERY 5  MINUTES AS NEEDED FOR CHEST PAIN.   ondansetron 4 MG tablet Commonly known as: ZOFRAN Take 4 mg by mouth daily as needed for nausea/vomiting.   OXYGEN Inhale 2 L/min into the lungs See admin instructions. 2 L/min of oxygen at bedtime and during the day as  needed for shortness of breath   pantoprazole 40 MG tablet Commonly known as: Protonix Take 1 tablet (40 mg total) by mouth 2 (two) times daily. To protect stomach while taking multiple blood thinners. What changed: when to take this   potassium chloride SA 20 MEQ tablet Commonly known as: KLOR-CON Take 1 tablet (20 mEq total) by mouth daily.   psyllium 58.6 % packet Commonly known as: METAMUCIL Take 0.5 packets by mouth daily. MIX AND DRINK   Spiriva HandiHaler 18 MCG inhalation capsule Generic drug: tiotropium Place 1 capsule into inhaler and inhale daily after breakfast.   traMADol 50 MG tablet Commonly known as: Ultram Take 1 tablet (50 mg total) by mouth every 6 (six) hours as needed.   traZODone 100 MG tablet Commonly known as: DESYREL Take 150 mg by mouth at bedtime.   True Metrix Blood Glucose Test test strip Generic drug: glucose blood   Vitamin D3 50 MCG (2000 UT) Tabs Take 2,000 Units by mouth daily.               Durable Medical Equipment  (From admission, onward)           Start     Ordered   12/18/20 0706  For home use only DME Negative pressure wound device  Once       Question Answer Comment  Frequency of dressing change 3 times per week   Length of need 6 Months   Dressing type Foam   Amount of suction 125 mm/Hg   Pressure application Continuous pressure   Supplies 10 canisters and 15 dressings per month for duration of therapy      12/18/20 0706              Discharge Care Instructions  (From admission, onward)           Start     Ordered   12/19/20 0000  Discharge wound care:  Comments: Wound VAC will be changed by Home Health Nurse 3x per week. Initial wound VAC change will be performed at your post op visit on 9/27   12/19/20 0757           Verbal and written Discharge instructions given to the patient. Wound care per Discharge AVS  Follow-up Information     [facility] Follow up.    Why: Appointment scheduled for Monday September 26th, 2022 at 1:30 pm. Please bring wound VAC supplies with you to your appointment, For wound re-check Contact information: [address] [place], [facility] Follow up.   Specialty: [place] Why: [facility] arranged for home wound VAC drsg needs 3x week (home VAC arranged with [employer]- [phone]) Contact information: [place] [address] [phone]                 Signed: Karoline Caldwell 12/19/2020, 8:02 AM

## 2020-12-21 ENCOUNTER — Other Ambulatory Visit: Payer: Self-pay

## 2020-12-21 ENCOUNTER — Ambulatory Visit: Payer: Medicare HMO | Admitting: Physician Assistant

## 2020-12-21 VITALS — BP 106/60 | HR 68 | Temp 98.6°F | Resp 20 | Ht 67.0 in | Wt 172.0 lb

## 2020-12-21 DIAGNOSIS — T8131XA Disruption of external operation (surgical) wound, not elsewhere classified, initial encounter: Secondary | ICD-10-CM | POA: Diagnosis not present

## 2020-12-21 DIAGNOSIS — I739 Peripheral vascular disease, unspecified: Secondary | ICD-10-CM

## 2020-12-21 MED ORDER — SULFAMETHOXAZOLE-TRIMETHOPRIM 800-160 MG PO TABS: 1.0000 | ORAL_TABLET | Freq: Two times a day (BID) | ORAL | 0 refills | Status: DC

## 2020-12-21 NOTE — Progress Notes (Signed)
POST OPERATIVE OFFICE NOTE    CC:  F/u for surgery  HPI:  This is a 87 y.o. male who is s/p I&D of right foot including skin, soft tissue, muscle, and tendon and placement of wound vac on 12/17/2020 by Dr. Trula Slade.  He was discharged home with plans to return today for wound vac change.   He is currently taking Keflex.     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  Prior to surgery he had a diagnostic arteriogram which demonstrated diffuse tibial disease however but no treatable flow-limiting stenosis at the time.  The patient and his wife state they had a blister appearing on the dorsal foot in the past few weeks which has slowly worsened.  He presented to his primary doctor yesterday after the blister burst and he encountered bleeding from the wound bed.  He also has redness ascending up his ankle.  He was started on Bactrim and referred back to our office today.  The patient denies any pain to his foot.  He also denies any chest pain, fevers, chills, nausea/vomiting.  He also has diabetes.    He is on Eliquis for afib and Plavix for PAD.  Pt returns today for follow up with his wife.  Pt states that they changed the kerlix around the wound vac last night as there was some bloody drainage on it.  He states he cannot feel his foot and does not have pain.  He has not had any fevers.    Allergies  Allergen Reactions   Bactrim [Sulfamethoxazole-Trimethoprim] Other (See Comments)    Pancytopenia   Codeine Shortness Of Breath   Doxycycline Swelling    Swelling and numbness in lips and face. Swelling improved after stopping. Reports still experiences numbness in bottom lip.    Feraheme [Ferumoxytol] Other (See Comments)    Diaphoretic, chest pain   Heparin Other (See Comments)    +HIT,  Severe bleeding (with heparin drip & large doses), tolerates low doses   Losartan Swelling   Oxycodone Other (See Comments)    "Made me act out of my mind" Mental  status changes- hallucinations   Latex Rash    Current Outpatient Medications  Medication Sig Dispense Refill   Accu-Chek Softclix Lancets lancets      acetaminophen (TYLENOL) 500 MG tablet Take 1 tablet (500 mg total) by mouth every 6 (six) hours as needed for mild pain or headache. (Patient taking differently: Take 1,000 mg by mouth every 6 (six) hours as needed for mild pain or headache.) 30 tablet 0   Alcohol Swabs (B-D SINGLE USE SWABS REGULAR) PADS      apixaban (ELIQUIS) 2.5 MG TABS tablet Take 1 tablet (2.5 mg total) by mouth 2 (two) times daily. 60 tablet 11   aspirin EC 81 MG EC tablet Take 1 tablet (81 mg total) by mouth daily. Swallow whole. 30 tablet 11   atorvastatin (LIPITOR) 80 MG tablet Take 80 mg by mouth every evening.      Blood Glucose Monitoring Suppl (ACCU-CHEK GUIDE) w/Device KIT      cephALEXin (KEFLEX) 500 MG capsule Take 1 capsule (500 mg total) by mouth 2 (two) times daily for 10 days. 20 capsule 0   Cholecalciferol (VITAMIN D3) 50 MCG (2000 UT) TABS Take 2,000 Units by mouth daily.     clopidogrel (PLAVIX) 75 MG tablet Take 1 tablet (75 mg total) by mouth daily with breakfast. 30 tablet 2  Cyanocobalamin (B-12) 2500 MCG TABS Take 2,500 mcg by mouth daily.     DROPLET INSULIN SYRINGE 31G X 5/16" 1 ML MISC      folic acid (FOLVITE) 1 MG tablet Take 1 tablet (1 mg total) by mouth daily. 30 tablet 1   furosemide (LASIX) 80 MG tablet Take 80 mg by mouth See admin instructions. Take 80 mg by mouth in the morning and between 5-6 PM daily     insulin glargine (LANTUS) 100 UNIT/ML injection Inject 30-40 Units into the skin See admin instructions. Inject 40 units into the skin in the morning and 30 units at bedtime     ipratropium-albuterol (DUONEB) 0.5-2.5 (3) MG/3ML SOLN Take 3 mLs by nebulization every other day.     lisinopril (ZESTRIL) 5 MG tablet Take 0.5 tablets (2.5 mg total) by mouth daily.     meclizine (ANTIVERT) 25 MG tablet Take 12.5 mg by mouth daily as needed  for dizziness.     metFORMIN (GLUCOPHAGE) 1000 MG tablet Take 1,000 mg by mouth 2 (two) times daily.     nitroGLYCERIN (NITROSTAT) 0.4 MG SL tablet PLACE 1 TABLET (0.4 MG TOTAL) UNDER THE TONGUE EVERY 5  MINUTES AS NEEDED FOR CHEST PAIN. 25 tablet 3   ondansetron (ZOFRAN) 4 MG tablet Take 4 mg by mouth daily as needed for nausea/vomiting.     OXYGEN Inhale 2 L/min into the lungs See admin instructions. 2 L/min of oxygen at bedtime and during the day as needed for shortness of breath     pantoprazole (PROTONIX) 40 MG tablet Take 1 tablet (40 mg total) by mouth 2 (two) times daily. To protect stomach while taking multiple blood thinners. (Patient taking differently: Take 40 mg by mouth daily. To protect stomach while taking multiple blood thinners.) 60 tablet 2   potassium chloride SA (K-DUR) 20 MEQ tablet Take 1 tablet (20 mEq total) by mouth daily. 30 tablet 1   psyllium (METAMUCIL) 58.6 % packet Take 0.5 packets by mouth daily. MIX AND DRINK     SPIRIVA HANDIHALER 18 MCG inhalation capsule Place 1 capsule into inhaler and inhale daily after breakfast.      traMADol (ULTRAM) 50 MG tablet Take 1 tablet (50 mg total) by mouth every 6 (six) hours as needed. 20 tablet 0   traZODone (DESYREL) 100 MG tablet Take 150 mg by mouth at bedtime.     TRUE METRIX BLOOD GLUCOSE TEST test strip      isosorbide mononitrate (IMDUR) 30 MG 24 hr tablet Take 1.5 tablets (45 mg total) by mouth in the morning and at bedtime. 270 tablet 3   No current facility-administered medications for this visit.     ROS:  See HPI  Physical Exam:  Today's Vitals   12/21/20 1327  BP: 106/60  Pulse: 68  Resp: 20  Temp: 98.6 F (37 C)  TempSrc: Temporal  SpO2: 99%  Weight: 172 lb (78 kg)  Height: 5' 7" (1.702 m)  PainSc: 0-No pain   Body mass index is 26.94 kg/m.   Incision:     Extremities:  brisk doppler signal right AT   Assessment/Plan:  This is a 87 y.o. male who is s/p: I&D of right foot including skin,  soft tissue, muscle, and tendon and placement of wound vac on 12/17/2020 by Dr. Trula Slade.   -wound vac removed today and wound looks pretty good.  He has a brisk right AT doppler signal.  Wound vac replaced and has a good seal.  HHRN  to start wound vac changes on Wednesday.  The cultures from the OR reveal sensitivity to Bactrim and after review and discussion with Dr. Trula Slade, we will change his abx back to Bactrim. -pt will f/u in 3 weeks for wound check on Dr. Stephens Shire clinic day.  He will call sooner if there are any issues.  -Dr. Trula Slade discussed with pt that he does not have to take asa and continue the plavix and Eliquis.   Leontine Locket, [facility] [phone]   Clinic MD:  pt seen with Dr. Trula Slade

## 2020-12-21 NOTE — Patient Outreach (Signed)
[facility]) Care Management  [place]  12/21/2020   Mike Wright 1933/06/24 [id number]  Subjective: Telephone call to patient. He reports doing better after I&D to right foot after a blister appeared to top of foot. Patient now has wound vac.  Follow up today with physician for wound check.  Reiterated signs of infection.  [facility] for home health involved.  Heart failure management continues.  No concerns.    Objective:   Encounter Medications:  Outpatient Encounter Medications as of 12/21/2020  Medication Sig   Accu-Chek Softclix Lancets lancets    acetaminophen (TYLENOL) 500 MG tablet Take 1 tablet (500 mg total) by mouth every 6 (six) hours as needed for mild pain or headache. (Patient taking differently: Take 1,000 mg by mouth every 6 (six) hours as needed for mild pain or headache.)   Alcohol Swabs (B-D SINGLE USE SWABS REGULAR) PADS    apixaban (ELIQUIS) 2.5 MG TABS tablet Take 1 tablet (2.5 mg total) by mouth 2 (two) times daily.   aspirin EC 81 MG EC tablet Take 1 tablet (81 mg total) by mouth daily. Swallow whole.   atorvastatin (LIPITOR) 80 MG tablet Take 80 mg by mouth every evening.    Blood Glucose Monitoring Suppl (ACCU-CHEK GUIDE) w/Device KIT    cephALEXin (KEFLEX) 500 MG capsule Take 1 capsule (500 mg total) by mouth 2 (two) times daily for 10 days.   Cholecalciferol (VITAMIN D3) 50 MCG (2000 UT) TABS Take 2,000 Units by mouth daily.   clopidogrel (PLAVIX) 75 MG tablet Take 1 tablet (75 mg total) by mouth daily with breakfast.   Cyanocobalamin (B-12) 2500 MCG TABS Take 2,500 mcg by mouth daily.   folic acid (FOLVITE) 1 MG tablet Take 1 tablet (1 mg total) by mouth daily.   furosemide (LASIX) 80 MG tablet Take 80 mg by mouth See admin instructions. Take 80 mg by mouth in the morning and between 5-6 PM daily   insulin glargine (LANTUS) 100 UNIT/ML injection Inject 30-40 Units into the skin See admin instructions. Inject 40 units into the skin  in the morning and 30 units at bedtime   ipratropium-albuterol (DUONEB) 0.5-2.5 (3) MG/3ML SOLN Take 3 mLs by nebulization every other day.   lisinopril (ZESTRIL) 5 MG tablet Take 0.5 tablets (2.5 mg total) by mouth daily.   meclizine (ANTIVERT) 25 MG tablet Take 12.5 mg by mouth daily as needed for dizziness.   metFORMIN (GLUCOPHAGE) 1000 MG tablet Take 1,000 mg by mouth 2 (two) times daily.   nitroGLYCERIN (NITROSTAT) 0.4 MG SL tablet PLACE 1 TABLET (0.4 MG TOTAL) UNDER THE TONGUE EVERY 5  MINUTES AS NEEDED FOR CHEST PAIN.   ondansetron (ZOFRAN) 4 MG tablet Take 4 mg by mouth daily as needed for nausea/vomiting.   OXYGEN Inhale 2 L/min into the lungs See admin instructions. 2 L/min of oxygen at bedtime and during the day as needed for shortness of breath   pantoprazole (PROTONIX) 40 MG tablet Take 1 tablet (40 mg total) by mouth 2 (two) times daily. To protect stomach while taking multiple blood thinners. (Patient taking differently: Take 40 mg by mouth daily. To protect stomach while taking multiple blood thinners.)   potassium chloride SA (K-DUR) 20 MEQ tablet Take 1 tablet (20 mEq total) by mouth daily.   psyllium (METAMUCIL) 58.6 % packet Take 0.5 packets by mouth daily. MIX AND DRINK   SPIRIVA HANDIHALER 18 MCG inhalation capsule Place 1 capsule into inhaler and inhale daily after breakfast.  traMADol (ULTRAM) 50 MG tablet Take 1 tablet (50 mg total) by mouth every 6 (six) hours as needed.   traZODone (DESYREL) 100 MG tablet Take 150 mg by mouth at bedtime.   TRUE METRIX BLOOD GLUCOSE TEST test strip    DROPLET INSULIN SYRINGE 31G X 5/16" 1 ML MISC    isosorbide mononitrate (IMDUR) 30 MG 24 hr tablet Take 1.5 tablets (45 mg total) by mouth in the morning and at bedtime.   No facility-administered encounter medications on file as of 12/21/2020.    Functional Status:  In your present state of health, do you have any difficulty performing the following activities: 12/21/2020 12/15/2020   Hearing? N N  Vision? N N  Difficulty concentrating or making decisions? N N  Walking or climbing stairs? Y N  Comment needs assist of one person for stability -  Dressing or bathing? N N  Doing errands, shopping? Y N  Comment wife assists -  Conservation officer, nature and eating ? N -  Using the Toilet? N -  In the past six months, have you accidently leaked urine? N -  Comment - -  Do you have problems with loss of bowel control? N -  Managing your Medications? - -  Managing your Finances? N -  Housekeeping or managing your Housekeeping? Y -  Comment wife assists -  Some recent data might be hidden    Fall/Depression Screening: Fall Risk  12/21/2020 10/08/2020 03/10/2020  Falls in the past year? 0 1 1  Comment - - -  Number falls in past yr: - 1 1  Injury with Fall? - 0 0  Risk for fall due to : - Orthopedic patient;Impaired balance/gait Orthopedic patient  Follow up - Falls prevention discussed Falls prevention discussed   PHQ 2/9 Scores 12/21/2020 10/08/2020 02/10/2020 11/07/2019 07/29/2019 06/12/2019 12/20/2018  PHQ - 2 Score 0 0 0 0 0 0 0    Assessment:   Care Plan Care Plan : Heart Failure (Adult)  Updates made by Jon Billings, RN since 12/21/2020 12:00 AM     Problem: Symptom Exacerbation (Heart Failure)      Long-Range Goal: Symptom Exacerbation Prevented or Minimized as evidenced by patient continuing to monitor weights regularly.   Start Date: 02/10/2020  Expected End Date: 09/24/2021  This Visit's Progress: On track  Recent Progress: On track  Priority: High  Note:   Evidence-based guidance: Establish a mutually-agreed-upon early intervention process to communicate with primary care provider when signs/symptoms worsen.  Notes: 06/29/20   Patient weighs daily and is aware of worsening symptoms.   10/08/20 Patient continues to weight daily. Weight 184 lbs and trending upward.  Discussed continuing to monitor weights and limit salt intake due to trending upward.   11/17/20  Patient weighing daily. Today's weight 179 lbs. Reiterated heart failure management.      Task: Identify and Minimize Risk of Heart Failure Exacerbation   Due Date: 03/27/2021  Priority: Routine  Responsible User: Jon Billings, RN  Note:   Care Management Activities:    - rescue (action) plan reviewed - self-awareness of signs/symptoms of worsening disease encouraged    Notes: 05-01-20 Patient knows signs of heart failure and when to notify physician.  06/29/20 Reiterated signs of heart failure and importance of weights.   10/08/20 Patient continues to weight daily. Weight 184 lbs and trending upward.  Discussed continuing to monitor weights and limit salt intake due to trending upward.   Heart Failure Management Discussed Please weight daily or  as ordered by your doctor Report to your doctor weight gain of 2 -3 pounds in a day or 5 pounds in a week Limit salt intake Monitor for shortness of breath, swelling of feet, ankles or abdomen and weight gain. Never use the saltshaker.  Read all food labels and avoid canned, processed, and pickled foods.   Follow your doctor's recommendations for daily salt intake. 11/17/20 Patient weighing daily. Today's weight 179 lbs. Reiterated heart failure management.   12/21/20 Daily weights continue.  Reiterated heart failure management.  Weight 172lbs.         Goals Addressed               This Visit's Progress     THN-Monitor foot wound for infection (pt-stated)   On track     Barriers: Knowledge Timeframe:  Short-Term Goal Priority:  High Start Date:   03/24/20                          Expected End Date:   03/27/21      Follow Up Date 01/25/21  - clean and dry skin well  Keep wound vac seal intact and enforce as needed between home health/ MD visits.          Why is this important?   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   Taking really good care of your skin will help to keep your skin unbroken.    Notes:  Patient denies signs of infection.  Wet to dry dressings daily. Wound closing up slowly.  Home health nurse 2/week. 04/15/20-patient with recent debridement of right foot surgical site.  Area now red with some bleeding to site per patient.  Santyl dressing daily. 05/02/20 Patient reports wound is doing good.  He did have a bleeding episode that was stopped with pressure to site. Home health continues 2/wk visits.   06/29/20 Wound almost healed.  Discussed continued use of boot to relieve pressure. 09/11/20 Wound almost healed.  Patient careful not to wear anything on foot that causes pressure. Wound Management Discussed Take antibiotics as prescribed Monitor for swelling, redness, pain, pus, and fever Keep wound clean and dry.   Notify physician immediately for any changes   10/08/20 Patient reports only a small area open now and just using a Band-Aid to cover.  Discussed wearing shoes and avoid wearing shoes that would apply pressure to area.   Wound Management Reiterated Monitor for swelling, redness, pain, pus, and fever Keep wound clean and dry.   Notify physician immediately for any changes  11/17/20 Patient still has only a small area open on right foot. Reviewed importance of wound monitoring for infection or worsening.   12/21/20 Patient with I& D  to right foot.  Has wound vac to foot with close follow up with physician and home health.  Reiterated signs of infection and when to notify physician.          THN-Track and Manage Symptoms   On track     Barriers: Health Behaviors Knowledge Timeframe:  Long-Range Goal Priority:  High Start Date:  03/24/20                           Expected End Date: 09/24/21  Follow up 01/25/21  - follow rescue plan if symptoms flare-up - know when to call the doctor    Why is this important?  You will be able to handle your symptoms better if you keep track of them.  Making some simple changes to your lifestyle will help.  Eating healthy is one thing  you can do to take good care of yourself.    Notes: 04/15/20 Weight 177 lbs.  05-02-20 Patient weight 179 lbs.   06/29/20 Patient weight 180 lbs. Keep up the great work! 09/11/20 Weight 182 lbs.  Continue weighing. Heart Failure Management Discussed Please weight daily or as ordered by your doctor Report to your doctor weight gain of 2 -3 pounds in a day or 5 pounds in a week Limit salt intake Monitor for shortness of breath, swelling of feet, ankles or abdomen and weight gain  10/08/20 Patient continues to weight daily. Weight 184 lbs and trending upward.  Discussed continuing to monitor weights and limit salt intake due to trending upward.   Heart Failure Management Reiterated Please weight daily or as ordered by your doctor Report to your doctor weight gain of 2 -3 pounds in a day or 5 pounds in a week Limit salt intake Monitor for shortness of breath, swelling of feet, ankles or abdomen and weight gain. Never use the saltshaker.  Read all food labels and avoid canned, processed, and pickled foods.   Follow your doctor's recommendations for daily salt intake. 11/17/20 Patient reports weight 179 lbs. Reviewed heart failure management and importance.  Patient continues to weigh daily. 12/21/20 Patient continues daily weights.  Weight 172 lbs. Reiterated health failure management.  Keep up the great work.          Plan:  Follow-up: Patient agrees to Care Plan and Follow-up. Follow-up in 2-3 week(s)  [name], RN, MSN [place] [facility] Care Management Coordinator Direct Line [phone] Cell [phone] Toll Free: [phone]  Fax: [phone]

## 2020-12-21 NOTE — Patient Instructions (Signed)
Goals Addressed               This Visit's Progress     THN-Monitor foot wound for infection (pt-stated)   On track     Barriers: Knowledge Timeframe:  Short-Term Goal Priority:  High Start Date:   03/24/20                          Expected End Date:   03/27/21      Follow Up Date 01/25/21  - clean and dry skin well  Keep wound vac seal intact and enforce as needed between home health/ MD visits.          Why is this important?   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   Taking really good care of your skin will help to keep your skin unbroken.    Notes: Patient denies signs of infection.  Wet to dry dressings daily. Wound closing up slowly.  Home health nurse 2/week. 04/15/20-patient with recent debridement of right foot surgical site.  Area now red with some bleeding to site per patient.  Santyl dressing daily. 05/02/20 Patient reports wound is doing good.  He did have a bleeding episode that was stopped with pressure to site. Home health continues 2/wk visits.   06/29/20 Wound almost healed.  Discussed continued use of boot to relieve pressure. 09/11/20 Wound almost healed.  Patient careful not to wear anything on foot that causes pressure. Wound Management Discussed Take antibiotics as prescribed Monitor for swelling, redness, pain, pus, and fever Keep wound clean and dry.   Notify physician immediately for any changes   10/08/20 Patient reports only a small area open now and just using a Band-Aid to cover.  Discussed wearing shoes and avoid wearing shoes that would apply pressure to area.   Wound Management Reiterated Monitor for swelling, redness, pain, pus, and fever Keep wound clean and dry.   Notify physician immediately for any changes  11/17/20 Patient still has only a small area open on right foot. Reviewed importance of wound monitoring for infection or worsening.   12/21/20 Patient with I& D  to right foot.  Has wound vac to foot with close follow up  with physician and home health.  Reiterated signs of infection and when to notify physician.          THN-Track and Manage Symptoms   On track     Barriers: Health Behaviors Knowledge Timeframe:  Long-Range Goal Priority:  High Start Date:  03/24/20                           Expected End Date: 09/24/21  Follow up 01/25/21  - follow rescue plan if symptoms flare-up - know when to call the doctor    Why is this important?   You will be able to handle your symptoms better if you keep track of them.  Making some simple changes to your lifestyle will help.  Eating healthy is one thing you can do to take good care of yourself.    Notes: 04/15/20 Weight 177 lbs.  05-02-20 Patient weight 179 lbs.   06/29/20 Patient weight 180 lbs. Keep up the great work! 09/11/20 Weight 182 lbs.  Continue weighing. Heart Failure Management Discussed Please weight daily or as ordered by your doctor Report to your doctor weight gain of 2 -3 pounds in a day or 5  pounds in a week Limit salt intake Monitor for shortness of breath, swelling of feet, ankles or abdomen and weight gain  10/08/20 Patient continues to weight daily. Weight 184 lbs and trending upward.  Discussed continuing to monitor weights and limit salt intake due to trending upward.   Heart Failure Management Reiterated Please weight daily or as ordered by your doctor Report to your doctor weight gain of 2 -3 pounds in a day or 5 pounds in a week Limit salt intake Monitor for shortness of breath, swelling of feet, ankles or abdomen and weight gain. Never use the saltshaker.  Read all food labels and avoid canned, processed, and pickled foods.   Follow your doctor's recommendations for daily salt intake. 11/17/20 Patient reports weight 179 lbs. Reviewed heart failure management and importance.  Patient continues to weigh daily. 12/21/20 Patient continues daily weights.  Weight 172 lbs. Reiterated health failure management.  Keep up the great work.

## 2020-12-22 DIAGNOSIS — I509 Heart failure, unspecified: Secondary | ICD-10-CM | POA: Diagnosis not present

## 2020-12-22 LAB — AEROBIC/ANAEROBIC CULTURE W GRAM STAIN (SURGICAL/DEEP WOUND)

## 2020-12-23 DIAGNOSIS — I2510 Atherosclerotic heart disease of native coronary artery without angina pectoris: Secondary | ICD-10-CM | POA: Diagnosis not present

## 2020-12-23 DIAGNOSIS — E1151 Type 2 diabetes mellitus with diabetic peripheral angiopathy without gangrene: Secondary | ICD-10-CM | POA: Diagnosis not present

## 2020-12-23 DIAGNOSIS — I480 Paroxysmal atrial fibrillation: Secondary | ICD-10-CM | POA: Diagnosis not present

## 2020-12-23 DIAGNOSIS — I5032 Chronic diastolic (congestive) heart failure: Secondary | ICD-10-CM | POA: Diagnosis not present

## 2020-12-23 DIAGNOSIS — M47812 Spondylosis without myelopathy or radiculopathy, cervical region: Secondary | ICD-10-CM | POA: Diagnosis not present

## 2020-12-23 DIAGNOSIS — T8189XA Other complications of procedures, not elsewhere classified, initial encounter: Secondary | ICD-10-CM | POA: Diagnosis not present

## 2020-12-23 DIAGNOSIS — I110 Hypertensive heart disease with heart failure: Secondary | ICD-10-CM | POA: Diagnosis not present

## 2020-12-23 DIAGNOSIS — J449 Chronic obstructive pulmonary disease, unspecified: Secondary | ICD-10-CM | POA: Diagnosis not present

## 2020-12-23 DIAGNOSIS — T8131XA Disruption of external operation (surgical) wound, not elsewhere classified, initial encounter: Secondary | ICD-10-CM | POA: Diagnosis not present

## 2020-12-23 DIAGNOSIS — D649 Anemia, unspecified: Secondary | ICD-10-CM | POA: Diagnosis not present

## 2020-12-25 DIAGNOSIS — I110 Hypertensive heart disease with heart failure: Secondary | ICD-10-CM | POA: Diagnosis not present

## 2020-12-25 DIAGNOSIS — I5032 Chronic diastolic (congestive) heart failure: Secondary | ICD-10-CM | POA: Diagnosis not present

## 2020-12-25 DIAGNOSIS — T8189XA Other complications of procedures, not elsewhere classified, initial encounter: Secondary | ICD-10-CM | POA: Diagnosis not present

## 2020-12-25 DIAGNOSIS — I480 Paroxysmal atrial fibrillation: Secondary | ICD-10-CM | POA: Diagnosis not present

## 2020-12-25 DIAGNOSIS — D649 Anemia, unspecified: Secondary | ICD-10-CM | POA: Diagnosis not present

## 2020-12-25 DIAGNOSIS — I10 Essential (primary) hypertension: Secondary | ICD-10-CM | POA: Diagnosis not present

## 2020-12-25 DIAGNOSIS — I2510 Atherosclerotic heart disease of native coronary artery without angina pectoris: Secondary | ICD-10-CM | POA: Diagnosis not present

## 2020-12-25 DIAGNOSIS — E119 Type 2 diabetes mellitus without complications: Secondary | ICD-10-CM | POA: Diagnosis not present

## 2020-12-25 DIAGNOSIS — E1151 Type 2 diabetes mellitus with diabetic peripheral angiopathy without gangrene: Secondary | ICD-10-CM | POA: Diagnosis not present

## 2020-12-25 DIAGNOSIS — M47812 Spondylosis without myelopathy or radiculopathy, cervical region: Secondary | ICD-10-CM | POA: Diagnosis not present

## 2020-12-25 DIAGNOSIS — J449 Chronic obstructive pulmonary disease, unspecified: Secondary | ICD-10-CM | POA: Diagnosis not present

## 2020-12-28 DIAGNOSIS — I480 Paroxysmal atrial fibrillation: Secondary | ICD-10-CM | POA: Diagnosis not present

## 2020-12-28 DIAGNOSIS — T8189XA Other complications of procedures, not elsewhere classified, initial encounter: Secondary | ICD-10-CM | POA: Diagnosis not present

## 2020-12-28 DIAGNOSIS — J449 Chronic obstructive pulmonary disease, unspecified: Secondary | ICD-10-CM | POA: Diagnosis not present

## 2020-12-28 DIAGNOSIS — I2510 Atherosclerotic heart disease of native coronary artery without angina pectoris: Secondary | ICD-10-CM | POA: Diagnosis not present

## 2020-12-28 DIAGNOSIS — D649 Anemia, unspecified: Secondary | ICD-10-CM | POA: Diagnosis not present

## 2020-12-28 DIAGNOSIS — I5032 Chronic diastolic (congestive) heart failure: Secondary | ICD-10-CM | POA: Diagnosis not present

## 2020-12-28 DIAGNOSIS — E1151 Type 2 diabetes mellitus with diabetic peripheral angiopathy without gangrene: Secondary | ICD-10-CM | POA: Diagnosis not present

## 2020-12-28 DIAGNOSIS — M47812 Spondylosis without myelopathy or radiculopathy, cervical region: Secondary | ICD-10-CM | POA: Diagnosis not present

## 2020-12-28 DIAGNOSIS — I110 Hypertensive heart disease with heart failure: Secondary | ICD-10-CM | POA: Diagnosis not present

## 2020-12-30 DIAGNOSIS — T8189XA Other complications of procedures, not elsewhere classified, initial encounter: Secondary | ICD-10-CM | POA: Diagnosis not present

## 2020-12-30 DIAGNOSIS — I110 Hypertensive heart disease with heart failure: Secondary | ICD-10-CM | POA: Diagnosis not present

## 2020-12-30 DIAGNOSIS — E1151 Type 2 diabetes mellitus with diabetic peripheral angiopathy without gangrene: Secondary | ICD-10-CM | POA: Diagnosis not present

## 2020-12-30 DIAGNOSIS — M47812 Spondylosis without myelopathy or radiculopathy, cervical region: Secondary | ICD-10-CM | POA: Diagnosis not present

## 2020-12-30 DIAGNOSIS — I5032 Chronic diastolic (congestive) heart failure: Secondary | ICD-10-CM | POA: Diagnosis not present

## 2020-12-30 DIAGNOSIS — I480 Paroxysmal atrial fibrillation: Secondary | ICD-10-CM | POA: Diagnosis not present

## 2020-12-30 DIAGNOSIS — I2510 Atherosclerotic heart disease of native coronary artery without angina pectoris: Secondary | ICD-10-CM | POA: Diagnosis not present

## 2020-12-30 DIAGNOSIS — D649 Anemia, unspecified: Secondary | ICD-10-CM | POA: Diagnosis not present

## 2020-12-30 DIAGNOSIS — J449 Chronic obstructive pulmonary disease, unspecified: Secondary | ICD-10-CM | POA: Diagnosis not present

## 2021-01-01 DIAGNOSIS — T8189XA Other complications of procedures, not elsewhere classified, initial encounter: Secondary | ICD-10-CM | POA: Diagnosis not present

## 2021-01-01 DIAGNOSIS — I110 Hypertensive heart disease with heart failure: Secondary | ICD-10-CM | POA: Diagnosis not present

## 2021-01-01 DIAGNOSIS — I2510 Atherosclerotic heart disease of native coronary artery without angina pectoris: Secondary | ICD-10-CM | POA: Diagnosis not present

## 2021-01-01 DIAGNOSIS — D649 Anemia, unspecified: Secondary | ICD-10-CM | POA: Diagnosis not present

## 2021-01-01 DIAGNOSIS — J449 Chronic obstructive pulmonary disease, unspecified: Secondary | ICD-10-CM | POA: Diagnosis not present

## 2021-01-01 DIAGNOSIS — E1151 Type 2 diabetes mellitus with diabetic peripheral angiopathy without gangrene: Secondary | ICD-10-CM | POA: Diagnosis not present

## 2021-01-01 DIAGNOSIS — I5032 Chronic diastolic (congestive) heart failure: Secondary | ICD-10-CM | POA: Diagnosis not present

## 2021-01-01 DIAGNOSIS — M47812 Spondylosis without myelopathy or radiculopathy, cervical region: Secondary | ICD-10-CM | POA: Diagnosis not present

## 2021-01-01 DIAGNOSIS — I480 Paroxysmal atrial fibrillation: Secondary | ICD-10-CM | POA: Diagnosis not present

## 2021-01-04 ENCOUNTER — Other Ambulatory Visit: Payer: Self-pay

## 2021-01-04 NOTE — Patient Instructions (Signed)
Goals Addressed               This Visit's Progress     THN-Monitor foot wound for infection (pt-stated)   On track     Barriers: Knowledge Timeframe:  Short-Term Goal Priority:  High Start Date:   03/24/20                          Expected End Date:   03/27/21      Follow Up Date 02/24/21  - clean and dry skin well  Keep wound vac seal intact and enforce as needed between home health/ MD visits.          Why is this important?   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   Taking really good care of your skin will help to keep your skin unbroken.    Notes: Patient denies signs of infection.  Wet to dry dressings daily. Wound closing up slowly.  Home health nurse 2/week. 04/15/20-patient with recent debridement of right foot surgical site.  Area now red with some bleeding to site per patient.  Santyl dressing daily. 05/02/20 Patient reports wound is doing good.  He did have a bleeding episode that was stopped with pressure to site. Home health continues 2/wk visits.   06/29/20 Wound almost healed.  Discussed continued use of boot to relieve pressure. 09/11/20 Wound almost healed.  Patient careful not to wear anything on foot that causes pressure. Wound Management Discussed Take antibiotics as prescribed Monitor for swelling, redness, pain, pus, and fever Keep wound clean and dry.   Notify physician immediately for any changes   10/08/20 Patient reports only a small area open now and just using a Band-Aid to cover.  Discussed wearing shoes and avoid wearing shoes that would apply pressure to area.   Wound Management Reiterated Monitor for swelling, redness, pain, pus, and fever Keep wound clean and dry.   Notify physician immediately for any changes  11/17/20 Patient still has only a small area open on right foot. Reviewed importance of wound monitoring for infection or worsening.   12/21/20 Patient with I& D  to right foot.  Has wound vac to foot with close follow up  with physician and home health.  Reiterated signs of infection and when to notify physician.   01/04/21 Patient reports that the wound vac malfunctioned over the weekend. Waiting for a new wound vac to come in.  Wet to dry dressing being used now. Patient denies any signs of infection.  Patient to see surgeon on 01/11/21 for wound check. No transportation problems.         THN-Track and Manage Symptoms (pt-stated)   On track     Barriers: Health Behaviors Knowledge Timeframe:  Long-Range Goal Priority:  High Start Date:  03/24/20                           Expected End Date: 09/24/21  Follow up 02/24/21  - follow rescue plan if symptoms flare-up - know when to call the doctor    Why is this important?   You will be able to handle your symptoms better if you keep track of them.  Making some simple changes to your lifestyle will help.  Eating healthy is one thing you can do to take good care of yourself.    Notes: 04/15/20 Weight 177 lbs.  05-02-20 Patient weight  179 lbs.   06/29/20 Patient weight 180 lbs. Keep up the great work! 09/11/20 Weight 182 lbs.  Continue weighing. Heart Failure Management Discussed Please weight daily or as ordered by your doctor Report to your doctor weight gain of 2 -3 pounds in a day or 5 pounds in a week Limit salt intake Monitor for shortness of breath, swelling of feet, ankles or abdomen and weight gain  10/08/20 Patient continues to weight daily. Weight 184 lbs and trending upward.  Discussed continuing to monitor weights and limit salt intake due to trending upward.   Heart Failure Management Reiterated Please weight daily or as ordered by your doctor Report to your doctor weight gain of 2 -3 pounds in a day or 5 pounds in a week Limit salt intake Monitor for shortness of breath, swelling of feet, ankles or abdomen and weight gain. Never use the saltshaker.  Read all food labels and avoid canned, processed, and pickled foods.   Follow your doctor's  recommendations for daily salt intake. 11/17/20 Patient reports weight 179 lbs. Reviewed heart failure management and importance.  Patient continues to weigh daily. 12/21/20 Patient continues daily weights.  Weight 172 lbs. Reiterated health failure management.  Keep up the great work.  01/04/21 Patient weight 177 lbs or less. Discussed heart failure management.  No concerns.

## 2021-01-04 NOTE — Patient Outreach (Signed)
[facility]) Care Management  [place]  01/04/2021   Mike Wright 08/15/1933 [id number]  Subjective: Telephone call to patient for follow up. Patient reports wound vac went out. Waiting for another one.  Wet to dry dressing being down until new vac arrives.  Discussed signs of wound infection and follow up with physician. He verbalized understanding.  Heart failure management continues.   Objective:   Encounter Medications:  Outpatient Encounter Medications as of 01/04/2021  Medication Sig   Accu-Chek Softclix Lancets lancets    acetaminophen (TYLENOL) 500 MG tablet Take 1 tablet (500 mg total) by mouth every 6 (six) hours as needed for mild pain or headache. (Patient taking differently: Take 1,000 mg by mouth every 6 (six) hours as needed for mild pain or headache.)   Alcohol Swabs (B-D SINGLE USE SWABS REGULAR) PADS    apixaban (ELIQUIS) 2.5 MG TABS tablet Take 1 tablet (2.5 mg total) by mouth 2 (two) times daily.   aspirin EC 81 MG EC tablet Take 1 tablet (81 mg total) by mouth daily. Swallow whole.   atorvastatin (LIPITOR) 80 MG tablet Take 80 mg by mouth every evening.    Blood Glucose Monitoring Suppl (ACCU-CHEK GUIDE) w/Device KIT    Cholecalciferol (VITAMIN D3) 50 MCG (2000 UT) TABS Take 2,000 Units by mouth daily.   clopidogrel (PLAVIX) 75 MG tablet Take 1 tablet (75 mg total) by mouth daily with breakfast.   Cyanocobalamin (B-12) 2500 MCG TABS Take 2,500 mcg by mouth daily.   DROPLET INSULIN SYRINGE 31G X 5/16" 1 ML MISC    folic acid (FOLVITE) 1 MG tablet Take 1 tablet (1 mg total) by mouth daily.   furosemide (LASIX) 80 MG tablet Take 80 mg by mouth See admin instructions. Take 80 mg by mouth in the morning and between 5-6 PM daily   insulin glargine (LANTUS) 100 UNIT/ML injection Inject 30-40 Units into the skin See admin instructions. Inject 40 units into the skin in the morning and 30 units at bedtime   ipratropium-albuterol (DUONEB) 0.5-2.5  (3) MG/3ML SOLN Take 3 mLs by nebulization every other day.   isosorbide mononitrate (IMDUR) 30 MG 24 hr tablet Take 1.5 tablets (45 mg total) by mouth in the morning and at bedtime.   lisinopril (ZESTRIL) 5 MG tablet Take 0.5 tablets (2.5 mg total) by mouth daily.   meclizine (ANTIVERT) 25 MG tablet Take 12.5 mg by mouth daily as needed for dizziness.   metFORMIN (GLUCOPHAGE) 1000 MG tablet Take 1,000 mg by mouth 2 (two) times daily.   nitroGLYCERIN (NITROSTAT) 0.4 MG SL tablet PLACE 1 TABLET (0.4 MG TOTAL) UNDER THE TONGUE EVERY 5  MINUTES AS NEEDED FOR CHEST PAIN.   ondansetron (ZOFRAN) 4 MG tablet Take 4 mg by mouth daily as needed for nausea/vomiting.   OXYGEN Inhale 2 L/min into the lungs See admin instructions. 2 L/min of oxygen at bedtime and during the day as needed for shortness of breath   pantoprazole (PROTONIX) 40 MG tablet Take 1 tablet (40 mg total) by mouth 2 (two) times daily. To protect stomach while taking multiple blood thinners. (Patient taking differently: Take 40 mg by mouth daily. To protect stomach while taking multiple blood thinners.)   potassium chloride SA (K-DUR) 20 MEQ tablet Take 1 tablet (20 mEq total) by mouth daily.   psyllium (METAMUCIL) 58.6 % packet Take 0.5 packets by mouth daily. MIX AND DRINK   SPIRIVA HANDIHALER 18 MCG inhalation capsule Place 1 capsule into inhaler  and inhale daily after breakfast.    sulfamethoxazole-trimethoprim (BACTRIM DS) 800-160 MG tablet Take 1 tablet by mouth 2 (two) times daily.   traMADol (ULTRAM) 50 MG tablet Take 1 tablet (50 mg total) by mouth every 6 (six) hours as needed.   traZODone (DESYREL) 100 MG tablet Take 150 mg by mouth at bedtime.   TRUE METRIX BLOOD GLUCOSE TEST test strip    No facility-administered encounter medications on file as of 01/04/2021.    Functional Status:  In your present state of health, do you have any difficulty performing the following activities: 12/21/2020 12/15/2020  Hearing? N N  Vision? N  N  Difficulty concentrating or making decisions? N N  Walking or climbing stairs? Y N  Comment needs assist of one person for stability -  Dressing or bathing? N N  Doing errands, shopping? Y N  Comment wife assists -  Conservation officer, nature and eating ? N -  Using the Toilet? N -  In the past six months, have you accidently leaked urine? N -  Comment - -  Do you have problems with loss of bowel control? N -  Managing your Medications? - -  Managing your Finances? N -  Housekeeping or managing your Housekeeping? Y -  Comment wife assists -  Some recent data might be hidden    Fall/Depression Screening: Fall Risk  12/21/2020 10/08/2020 03/10/2020  Falls in the past year? 0 1 1  Comment - - -  Number falls in past yr: - 1 1  Injury with Fall? - 0 0  Risk for fall due to : - Orthopedic patient;Impaired balance/gait Orthopedic patient  Follow up - Falls prevention discussed Falls prevention discussed   PHQ 2/9 Scores 12/21/2020 10/08/2020 02/10/2020 11/07/2019 07/29/2019 06/12/2019 12/20/2018  PHQ - 2 Score 0 0 0 0 0 0 0    Assessment:   Care Plan Care Plan : Heart Failure (Adult)  Updates made by Jon Billings, RN since 01/04/2021 12:00 AM     Problem: Symptom Exacerbation (Heart Failure)      Long-Range Goal: Symptom Exacerbation Prevented or Minimized as evidenced by patient continuing to monitor weights regularly.   Start Date: 02/10/2020  Expected End Date: 09/24/2021  This Visit's Progress: On track  Recent Progress: On track  Priority: High  Note:   Evidence-based guidance: Establish a mutually-agreed-upon early intervention process to communicate with primary care provider when signs/symptoms worsen.  Notes: 06/29/20   Patient weighs daily and is aware of worsening symptoms.   10/08/20 Patient continues to weight daily. Weight 184 lbs and trending upward.  Discussed continuing to monitor weights and limit salt intake due to trending upward.   11/17/20 Patient weighing daily.  Today's weight 179 lbs. Reiterated heart failure management.      Task: Identify and Minimize Risk of Heart Failure Exacerbation   Due Date: 09/24/2021  Priority: Routine  Responsible User: Jon Billings, RN  Note:   Care Management Activities:    - rescue (action) plan reviewed - self-awareness of signs/symptoms of worsening disease encouraged    Notes: 05-01-20 Patient knows signs of heart failure and when to notify physician.  06/29/20 Reiterated signs of heart failure and importance of weights.   10/08/20 Patient continues to weight daily. Weight 184 lbs and trending upward.  Discussed continuing to monitor weights and limit salt intake due to trending upward.   Heart Failure Management Discussed Please weight daily or as ordered by your doctor Report to your doctor weight gain  of 2 -3 pounds in a day or 5 pounds in a week Limit salt intake Monitor for shortness of breath, swelling of feet, ankles or abdomen and weight gain. Never use the saltshaker.  Read all food labels and avoid canned, processed, and pickled foods.   Follow your doctor's recommendations for daily salt intake. 11/17/20 Patient weighing daily. Today's weight 179 lbs. Reiterated heart failure management.   12/21/20 Daily weights continue.  Reiterated heart failure management.  Weight 172lbs.    01/04/21 Patient reports he is doing well. Weight staying around 177 lbs or less. Reiterated heart failure management.  No concerns.      Goals Addressed               This Visit's Progress     THN-Monitor foot wound for infection (pt-stated)   On track     Barriers: Knowledge Timeframe:  Short-Term Goal Priority:  High Start Date:   03/24/20                          Expected End Date:   03/27/21      Follow Up Date 02/24/21  - clean and dry skin well  Keep wound vac seal intact and enforce as needed between home health/ MD visits.          Why is this important?   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   Taking really good care of your skin will help to keep your skin unbroken.    Notes: Patient denies signs of infection.  Wet to dry dressings daily. Wound closing up slowly.  Home health nurse 2/week. 04/15/20-patient with recent debridement of right foot surgical site.  Area now red with some bleeding to site per patient.  Santyl dressing daily. 05/02/20 Patient reports wound is doing good.  He did have a bleeding episode that was stopped with pressure to site. Home health continues 2/wk visits.   06/29/20 Wound almost healed.  Discussed continued use of boot to relieve pressure. 09/11/20 Wound almost healed.  Patient careful not to wear anything on foot that causes pressure. Wound Management Discussed Take antibiotics as prescribed Monitor for swelling, redness, pain, pus, and fever Keep wound clean and dry.   Notify physician immediately for any changes   10/08/20 Patient reports only a small area open now and just using a Band-Aid to cover.  Discussed wearing shoes and avoid wearing shoes that would apply pressure to area.   Wound Management Reiterated Monitor for swelling, redness, pain, pus, and fever Keep wound clean and dry.   Notify physician immediately for any changes  11/17/20 Patient still has only a small area open on right foot. Reviewed importance of wound monitoring for infection or worsening.   12/21/20 Patient with I& D  to right foot.  Has wound vac to foot with close follow up with physician and home health.  Reiterated signs of infection and when to notify physician.   01/04/21 Patient reports that the wound vac malfunctioned over the weekend. Waiting for a new wound vac to come in.  Wet to dry dressing being used now. Patient denies any signs of infection.  Patient to see surgeon on 01/11/21 for wound check. No transportation problems.         THN-Track and Manage Symptoms (pt-stated)   On track     Barriers: Health Behaviors Knowledge Timeframe:  Long-Range  Goal Priority:  High Start Date:  03/24/20                           Expected End Date: 09/24/21  Follow up 02/24/21  - follow rescue plan if symptoms flare-up - know when to call the doctor    Why is this important?   You will be able to handle your symptoms better if you keep track of them.  Making some simple changes to your lifestyle will help.  Eating healthy is one thing you can do to take good care of yourself.    Notes: 04/15/20 Weight 177 lbs.  05-02-20 Patient weight 179 lbs.   06/29/20 Patient weight 180 lbs. Keep up the great work! 09/11/20 Weight 182 lbs.  Continue weighing. Heart Failure Management Discussed Please weight daily or as ordered by your doctor Report to your doctor weight gain of 2 -3 pounds in a day or 5 pounds in a week Limit salt intake Monitor for shortness of breath, swelling of feet, ankles or abdomen and weight gain  10/08/20 Patient continues to weight daily. Weight 184 lbs and trending upward.  Discussed continuing to monitor weights and limit salt intake due to trending upward.   Heart Failure Management Reiterated Please weight daily or as ordered by your doctor Report to your doctor weight gain of 2 -3 pounds in a day or 5 pounds in a week Limit salt intake Monitor for shortness of breath, swelling of feet, ankles or abdomen and weight gain. Never use the saltshaker.  Read all food labels and avoid canned, processed, and pickled foods.   Follow your doctor's recommendations for daily salt intake. 11/17/20 Patient reports weight 179 lbs. Reviewed heart failure management and importance.  Patient continues to weigh daily. 12/21/20 Patient continues daily weights.  Weight 172 lbs. Reiterated health failure management.  Keep up the great work.  01/04/21 Patient weight 177 lbs or less. Discussed heart failure management.  No concerns.          Plan:  Follow-up: Patient agrees to Care Plan and Follow-up. Follow-up in 2-3 week(s)  [name], RN,  MSN [place] [facility] Care Management Coordinator Direct Line [phone] Cell [phone] Toll Free: [phone]  Fax: [phone]

## 2021-01-05 DIAGNOSIS — I5032 Chronic diastolic (congestive) heart failure: Secondary | ICD-10-CM | POA: Diagnosis not present

## 2021-01-05 DIAGNOSIS — J449 Chronic obstructive pulmonary disease, unspecified: Secondary | ICD-10-CM | POA: Diagnosis not present

## 2021-01-05 DIAGNOSIS — I110 Hypertensive heart disease with heart failure: Secondary | ICD-10-CM | POA: Diagnosis not present

## 2021-01-05 DIAGNOSIS — D649 Anemia, unspecified: Secondary | ICD-10-CM | POA: Diagnosis not present

## 2021-01-05 DIAGNOSIS — E1151 Type 2 diabetes mellitus with diabetic peripheral angiopathy without gangrene: Secondary | ICD-10-CM | POA: Diagnosis not present

## 2021-01-05 DIAGNOSIS — I480 Paroxysmal atrial fibrillation: Secondary | ICD-10-CM | POA: Diagnosis not present

## 2021-01-05 DIAGNOSIS — T8189XA Other complications of procedures, not elsewhere classified, initial encounter: Secondary | ICD-10-CM | POA: Diagnosis not present

## 2021-01-05 DIAGNOSIS — I2510 Atherosclerotic heart disease of native coronary artery without angina pectoris: Secondary | ICD-10-CM | POA: Diagnosis not present

## 2021-01-05 DIAGNOSIS — M47812 Spondylosis without myelopathy or radiculopathy, cervical region: Secondary | ICD-10-CM | POA: Diagnosis not present

## 2021-01-06 DIAGNOSIS — I5032 Chronic diastolic (congestive) heart failure: Secondary | ICD-10-CM | POA: Diagnosis not present

## 2021-01-06 DIAGNOSIS — J449 Chronic obstructive pulmonary disease, unspecified: Secondary | ICD-10-CM | POA: Diagnosis not present

## 2021-01-06 DIAGNOSIS — M47812 Spondylosis without myelopathy or radiculopathy, cervical region: Secondary | ICD-10-CM | POA: Diagnosis not present

## 2021-01-06 DIAGNOSIS — E1151 Type 2 diabetes mellitus with diabetic peripheral angiopathy without gangrene: Secondary | ICD-10-CM | POA: Diagnosis not present

## 2021-01-06 DIAGNOSIS — I2510 Atherosclerotic heart disease of native coronary artery without angina pectoris: Secondary | ICD-10-CM | POA: Diagnosis not present

## 2021-01-06 DIAGNOSIS — T8189XA Other complications of procedures, not elsewhere classified, initial encounter: Secondary | ICD-10-CM | POA: Diagnosis not present

## 2021-01-06 DIAGNOSIS — D649 Anemia, unspecified: Secondary | ICD-10-CM | POA: Diagnosis not present

## 2021-01-06 DIAGNOSIS — I110 Hypertensive heart disease with heart failure: Secondary | ICD-10-CM | POA: Diagnosis not present

## 2021-01-06 DIAGNOSIS — I480 Paroxysmal atrial fibrillation: Secondary | ICD-10-CM | POA: Diagnosis not present

## 2021-01-08 DIAGNOSIS — J449 Chronic obstructive pulmonary disease, unspecified: Secondary | ICD-10-CM | POA: Diagnosis not present

## 2021-01-08 DIAGNOSIS — T8189XA Other complications of procedures, not elsewhere classified, initial encounter: Secondary | ICD-10-CM | POA: Diagnosis not present

## 2021-01-08 DIAGNOSIS — I2510 Atherosclerotic heart disease of native coronary artery without angina pectoris: Secondary | ICD-10-CM | POA: Diagnosis not present

## 2021-01-08 DIAGNOSIS — I480 Paroxysmal atrial fibrillation: Secondary | ICD-10-CM | POA: Diagnosis not present

## 2021-01-08 DIAGNOSIS — I5032 Chronic diastolic (congestive) heart failure: Secondary | ICD-10-CM | POA: Diagnosis not present

## 2021-01-08 DIAGNOSIS — M47812 Spondylosis without myelopathy or radiculopathy, cervical region: Secondary | ICD-10-CM | POA: Diagnosis not present

## 2021-01-08 DIAGNOSIS — D649 Anemia, unspecified: Secondary | ICD-10-CM | POA: Diagnosis not present

## 2021-01-08 DIAGNOSIS — I110 Hypertensive heart disease with heart failure: Secondary | ICD-10-CM | POA: Diagnosis not present

## 2021-01-08 DIAGNOSIS — E1151 Type 2 diabetes mellitus with diabetic peripheral angiopathy without gangrene: Secondary | ICD-10-CM | POA: Diagnosis not present

## 2021-01-11 ENCOUNTER — Ambulatory Visit (INDEPENDENT_AMBULATORY_CARE_PROVIDER_SITE_OTHER): Payer: Medicare HMO | Admitting: Physician Assistant

## 2021-01-11 ENCOUNTER — Encounter: Payer: Self-pay | Admitting: Physician Assistant

## 2021-01-11 ENCOUNTER — Other Ambulatory Visit: Payer: Self-pay

## 2021-01-11 VITALS — BP 131/61 | HR 57 | Temp 97.4°F | Resp 18 | Ht 67.0 in | Wt 177.0 lb

## 2021-01-11 DIAGNOSIS — T8189XD Other complications of procedures, not elsewhere classified, subsequent encounter: Secondary | ICD-10-CM

## 2021-01-11 DIAGNOSIS — I739 Peripheral vascular disease, unspecified: Secondary | ICD-10-CM

## 2021-01-11 NOTE — Progress Notes (Signed)
Office Note     CC:  follow up Requesting Provider:  Monico Blitz, MD  HPI: Mike Wright is a 87 y.o. (1933/11/20) male who presents for wound follow up. He recently underwent I&D of right foot including skin, soft tissue, muscle, and tendon and placement of wound vac on 12/17/2020 by Dr. Trula Slade. He was discharged home with wound VAC on right foot. Home health has been changing it on MWF. He was last seen here on 12/21/20 at which time wound VAC was changed and well appearing healthy wound. He was continued on Bactrim. He was instructed to continue Plavix and Eliquis.  He presents today with his wife for follow up. The wound today he and his wife feel is improving. He says he is "having pain in his big toe". Otherwise denies any pain in right leg. No pain on ambulation. He denies any fever or chills. He has been taking his Bactrim and says his Rx will last through Thursday.   He reports some blistering and dry scabs appeared on his right 2nd and 3rd finger tips last week. He says this is not the first time this has happened. He said last time he developed blisters which then scabbed over and eventually fell off as these healed. He denies any numbness or pain in his hand. He takes his Eliquis and Plavix as prescribed  The pt is on a statin for cholesterol management.  The pt is on a daily aspirin.   Other AC:  Eliquis (afib),Plavix The pt is on Imdur, diuretic, ACE for hypertension.   The pt is diabetic.   Tobacco hx:  Former, 1985  Past Medical History:  Diagnosis Date   Anemia    a. mild, noted 04/2017.   CAD in native artery    a. [name] 04/2017 s/p DES to D1, DES to prox-mid LAD, DES to prox LAD overlapping the prior stent, LVEF 55-65%.    Chronic diastolic CHF (congestive heart failure) (HCC)    Constipation    COPD (chronic obstructive pulmonary disease) (HCC)    Diabetic ulcer of toe (HCC)    DJD (degenerative joint disease) of cervical spine    Dysrhythmia    AFib   Essential  hypertension    GERD (gastroesophageal reflux disease)    History of hiatal hernia    HIT (heparin-induced thrombocytopenia)    Hypothyroidism    Hypoxia    a. went home on home O2 04/2017.   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   Retinitis    Sinus bradycardia    a. HR 30s-40s in 04/2017 -> diltiazem stopped, metoprolol reduced.   Sleep apnea    "chose not to order CPAP at this time" (05/18/2017)   Type 2 diabetes mellitus ([place])    Wears glasses    Wheezing    a. suspected COPD 04/2017. Former tobacco x 40 years.    Past Surgical History:  Procedure Laterality Date   ABDOMINAL AORTOGRAM W/LOWER EXTREMITY N/A 09/15/2017   Procedure: ABDOMINAL AORTOGRAM W/LOWER EXTREMITY;  Surgeon: Serafina Mitchell, MD;  Location: [facility];  Service: Cardiovascular;  Laterality: N/A;   ABDOMINAL AORTOGRAM W/LOWER EXTREMITY N/A 02/04/2020   Procedure: ABDOMINAL AORTOGRAM W/LOWER EXTREMITY;  Surgeon: Serafina Mitchell, MD;  Location:  [facility];  Service: Cardiovascular;  Laterality: N/A;   ABDOMINAL AORTOGRAM W/LOWER EXTREMITY N/A 12/15/2020   Procedure: ABDOMINAL AORTOGRAM W/LOWER EXTREMITY;  Surgeon: Serafina Mitchell, MD;  Location: [facility];  Service: Cardiovascular;  Laterality: N/A;   AMPUTATION Bilateral 09/20/2017   Procedure: BILATERAL GREAT TOE AMPUTATIONS INCLUDING METATARSAL HEADS;  Surgeon: Serafina Mitchell, MD;  Location: [facility];  Service: Vascular;  Laterality: Bilateral;   AMPUTATION Right 03/01/2019   Procedure: AMPUTATION TOES;  Surgeon: Angelia Mould, MD;  Location: [facility];  Service: Vascular;  Laterality: Right;   AMPUTATION Right 05/29/2019   Procedure: AMPUTATION RIGHT THIRD TOE;  Surgeon: Serafina Mitchell, MD;  Location: [place];  Service: Vascular;  Laterality:  Right;   AMPUTATION Right 09/26/2019   Procedure: AMPUTATION RIGHT FOURTH TOE AND FIFTH TOES;  Surgeon: Serafina Mitchell, MD;  Location: [place];  Service: Vascular;  Laterality: Right;   ANGIOPLASTY Right 09/20/2017   Procedure: ANGIOPLASTY RIGHT TIBIAL ARTERY;  Surgeon: Serafina Mitchell, MD;  Location: [facility];  Service: Vascular;  Laterality: Right;   APPENDECTOMY     APPLICATION OF WOUND VAC  12/17/2020   Procedure: APPLICATION OF WOUND VAC;  Surgeon: Serafina Mitchell, MD;  Location: [place];  Service: Vascular;;   [place]; 2012;   CATARACT EXTRACTION Left 2004   COLONOSCOPY  2004   FLEISHMAN TICS   COLONOSCOPY WITH PROPOFOL N/A 11/28/2018   Procedure: COLONOSCOPY WITH PROPOFOL;  Surgeon: Rogene Houston, MD;  [facility];  Service: Endoscopy;  Laterality: N/A;   CORONARY ANGIOPLASTY WITH STENT PLACEMENT  05/18/2017   "3 stents"   CORONARY STENT INTERVENTION N/A 05/18/2017   Procedure: CORONARY STENT INTERVENTION;  Surgeon: Jettie Booze, MD;  Location: [facility];  Service: Cardiovascular;  Laterality: N/A;   ESOPHAGOGASTRODUODENOSCOPY (EGD) WITH PROPOFOL N/A 11/20/2017   Procedure: ESOPHAGOGASTRODUODENOSCOPY (EGD) WITH PROPOFOL;  Surgeon: Irene Shipper, MD;  Location: [place];  Service: Gastroenterology;  Laterality: N/A;   ESOPHAGOGASTRODUODENOSCOPY (EGD) WITH PROPOFOL N/A 11/28/2018   Procedure: ESOPHAGOGASTRODUODENOSCOPY (EGD) WITH PROPOFOL;  Surgeon: Rogene Houston, MD;  [facility];  Service: Endoscopy;  Laterality: N/A;   ESOPHAGOGASTRODUODENOSCOPY (EGD) WITH PROPOFOL N/A 07/12/2019   Procedure: ESOPHAGOGASTRODUODENOSCOPY (EGD) WITH PROPOFOL;  Surgeon: Rogene Houston, MD;  [facility];  Service: Endoscopy;  Laterality: N/A;  [place] N/A 12/17/2018   Procedure: GIVENS CAPSULE STUDY;  Surgeon: Rogene Houston, MD;  [facility];  Service: Endoscopy;  Laterality: N/A;   7:30am   I & D EXTREMITY Right 12/17/2017   Procedure: IRRIGATION AND DEBRIDEMENT RIGHT GREAT TOE;  Surgeon: Serafina Mitchell, MD;  [facility];  Service: Vascular;  Laterality: Right;   INCISION AND DRAINAGE Right 12/17/2020   Procedure: INCISION AND DRAINAGE OF RIGHT FOOT;  Surgeon: Serafina Mitchell, MD;  [facility];  Service: Vascular;  Laterality: Right;   JOINT REPLACEMENT     LEFT HEART CATH AND CORONARY ANGIOGRAPHY N/A 05/18/2017   Procedure: LEFT HEART CATH AND CORONARY ANGIOGRAPHY;  Surgeon: Jettie Booze, MD;  Location: [facility];  Service: Cardiovascular;  Laterality: N/A;   LOWER EXTREMITY ANGIOGRAM Right 09/20/2017   Procedure: RIGHT LOWER LEG  ANGIOGRAM;  Surgeon: Serafina Mitchell, MD;  Location: [place];  Service: Vascular;  Laterality: Right;   LUMBAR FUSION  2002  L3, 4 L4, 5 L5 S1 Fused by Dr. Glenna Fellows   PERIPHERAL VASCULAR BALLOON ANGIOPLASTY Left 09/15/2017   Procedure: PERIPHERAL VASCULAR BALLOON ANGIOPLASTY;  Surgeon: Serafina Mitchell, MD;  Location: [facility] [facility];  Service: Cardiovascular;  Laterality: Left;  PTA of Peroneal & Posterior Tibial   PERIPHERAL VASCULAR INTERVENTION Right 12/15/2020   Procedure: PERIPHERAL VASCULAR INTERVENTION;  Surgeon: Serafina Mitchell, MD;  Location: [facility];  Service: Cardiovascular;  Laterality: Right;  SFA   POLYPECTOMY  11/28/2018   Procedure: POLYPECTOMY;  Surgeon: Rogene Houston, MD;  [facility];  Service: Endoscopy;;  duodenum   POSTERIOR LUMBAR FUSION     RHINOPLASTY     RIGHT HEART CATH N/A 05/18/2017   Procedure: RIGHT HEART CATH;  Surgeon: Jettie Booze, MD;  Location: [facility];  Service: Cardiovascular;  Laterality: N/A;   TEE WITHOUT CARDIOVERSION N/A 09/18/2017   Procedure: TRANSESOPHAGEAL ECHOCARDIOGRAM (TEE);  Surgeon: Fay Records, MD;  Location: [facility];  Service: Cardiovascular;  Laterality: N/A;   TOTAL KNEE ARTHROPLASTY Bilateral    TRANSMETATARSAL  AMPUTATION Right 02/04/2020   Procedure: RIGHT TRANSMETATARSAL AMPUTATION;  Surgeon: Serafina Mitchell, MD;  [facility];  Service: Vascular;  Laterality: Right;   TRANSURETHRAL RESECTION OF PROSTATE  2001   [place]    Social History   Socioeconomic History   Marital status: Married    Spouse name: patsy   Number of children: Not on file   Years of education: Not on file   Highest education level: Not on file  Occupational History   Not on file  Tobacco Use   Smoking status: Former    Packs/day: 2.00    Years: 30.00    Pack years: 60.00    Types: Cigarettes    Start date: 03/28/1953    Quit date: 03/29/1983    Years since quitting: 37.8   Smokeless tobacco: Never  Vaping Use   Vaping Use: Never used  Substance and Sexual Activity   Alcohol use: Not Currently    Comment: 05/18/2017 'I drink a beer q yr"   Drug use: No   Sexual activity: Not Currently  Other Topics Concern   Not on file  Social History Narrative   Patient lives at home with his spouse.    Social Determinants of Health   Financial Resource Strain: Not on file  Food Insecurity: No Food Insecurity   Worried About Charity fundraiser in the Last Year: Never true   Ran Out of Food in the Last Year: Never true  Transportation Needs: No Transportation Needs   Lack of Transportation (Medical): No   Lack of Transportation (Non-Medical): No  Physical Activity: Not on file  Stress: Not on file  Social Connections: Not on file  Intimate Partner Violence: Not on file    Family History  Problem Relation Age of Onset   Heart attack Father 16   COPD Father    COPD Mother    Heart disease Mother    Diabetes Mother    Hypertension Sister    CVA Sister    Diabetes Sister    Multiple sclerosis Sister     Current Outpatient Medications  Medication Sig Dispense Refill   Accu-Chek Softclix Lancets lancets      acetaminophen (TYLENOL) 500 MG tablet Take 1 tablet (500 mg total) by mouth every 6 (six) hours as  needed for mild pain or headache. (Patient taking differently: Take 1,000 mg  by mouth every 6 (six) hours as needed for mild pain or headache.) 30 tablet 0   Alcohol Swabs (B-D SINGLE USE SWABS REGULAR) PADS      apixaban (ELIQUIS) 2.5 MG TABS tablet Take 1 tablet (2.5 mg total) by mouth 2 (two) times daily. 60 tablet 11   atorvastatin (LIPITOR) 80 MG tablet Take 80 mg by mouth every evening.      Blood Glucose Monitoring Suppl (ACCU-CHEK GUIDE) w/Device KIT      Cholecalciferol (VITAMIN D3) 50 MCG (2000 UT) TABS Take 2,000 Units by mouth daily.     clopidogrel (PLAVIX) 75 MG tablet Take 1 tablet (75 mg total) by mouth daily with breakfast. 30 tablet 2   Cyanocobalamin (B-12) 2500 MCG TABS Take 2,500 mcg by mouth daily.     DROPLET INSULIN SYRINGE 31G X 5/16" 1 ML MISC      folic acid (FOLVITE) 1 MG tablet Take 1 tablet (1 mg total) by mouth daily. 30 tablet 1   furosemide (LASIX) 80 MG tablet Take 80 mg by mouth See admin instructions. Take 80 mg by mouth in the morning and between 5-6 PM daily     insulin glargine (LANTUS) 100 UNIT/ML injection Inject 30-40 Units into the skin See admin instructions. Inject 40 units into the skin in the morning and 30 units at bedtime     ipratropium-albuterol (DUONEB) 0.5-2.5 (3) MG/3ML SOLN Take 3 mLs by nebulization every other day.     lisinopril (ZESTRIL) 5 MG tablet Take 0.5 tablets (2.5 mg total) by mouth daily.     meclizine (ANTIVERT) 25 MG tablet Take 12.5 mg by mouth daily as needed for dizziness.     metFORMIN (GLUCOPHAGE) 1000 MG tablet Take 1,000 mg by mouth 2 (two) times daily.     nitroGLYCERIN (NITROSTAT) 0.4 MG SL tablet PLACE 1 TABLET (0.4 MG TOTAL) UNDER THE TONGUE EVERY 5  MINUTES AS NEEDED FOR CHEST PAIN. 25 tablet 3   ondansetron (ZOFRAN) 4 MG tablet Take 4 mg by mouth daily as needed for nausea/vomiting.     OXYGEN Inhale 2 L/min into the lungs See admin instructions. 2 L/min of oxygen at bedtime and during the day as needed for shortness  of breath     pantoprazole (PROTONIX) 40 MG tablet Take 1 tablet (40 mg total) by mouth 2 (two) times daily. To protect stomach while taking multiple blood thinners. (Patient taking differently: Take 40 mg by mouth daily. To protect stomach while taking multiple blood thinners.) 60 tablet 2   potassium chloride SA (K-DUR) 20 MEQ tablet Take 1 tablet (20 mEq total) by mouth daily. 30 tablet 1   psyllium (METAMUCIL) 58.6 % packet Take 0.5 packets by mouth daily. [place] 18 MCG inhalation capsule Place 1 capsule into inhaler and inhale daily after breakfast.      sulfamethoxazole-trimethoprim (BACTRIM DS) 800-160 MG tablet Take 1 tablet by mouth 2 (two) times daily. 42 tablet 0   traMADol (ULTRAM) 50 MG tablet Take 1 tablet (50 mg total) by mouth every 6 (six) hours as needed. 20 tablet 0   traZODone (DESYREL) 100 MG tablet Take 150 mg by mouth at bedtime.     TRUE METRIX BLOOD GLUCOSE TEST test strip      aspirin EC 81 MG EC tablet Take 1 tablet (81 mg total) by mouth daily. Swallow whole. (Patient not taking: Reported on 01/11/2021) 30 tablet 11   isosorbide mononitrate (IMDUR) 30 MG 24  hr tablet Take 1.5 tablets (45 mg total) by mouth in the morning and at bedtime. 270 tablet 3   No current facility-administered medications for this visit.    Allergies  Allergen Reactions   Bactrim [Sulfamethoxazole-Trimethoprim] Other (See Comments)    Pancytopenia   Codeine Shortness Of Breath   Doxycycline Swelling    Swelling and numbness in lips and face. Swelling improved after stopping. Reports still experiences numbness in bottom lip.    Feraheme [Ferumoxytol] Other (See Comments)    Diaphoretic, chest pain   Heparin Other (See Comments)    +HIT,  Severe bleeding (with heparin drip & large doses), tolerates low doses   Losartan Swelling   Oxycodone Other (See Comments)    "Made me act out of my mind" Mental status changes- hallucinations   Latex Rash     REVIEW  OF SYSTEMS:  _0  denotes positive finding, _1  denotes negative finding Cardiac  Comments:  Chest pain or chest pressure:    Shortness of breath upon exertion:    Short of breath when lying flat:    Irregular heart rhythm:        Vascular    Pain in calf, thigh, or hip brought on by ambulation:    Pain in feet at night that wakes you up from your sleep:     Blood clot in your veins:    Leg swelling:         Pulmonary    Oxygen at home:    Productive cough:     Wheezing:         Neurologic    Sudden weakness in arms or legs:     Sudden numbness in arms or legs:     Sudden onset of difficulty speaking or slurred speech:    Temporary loss of vision in one eye:     Problems with dizziness:         Gastrointestinal    Blood in stool:     Vomited blood:         Genitourinary    Burning when urinating:     Blood in urine:        Psychiatric    Major depression:         Hematologic    Bleeding problems:    Problems with blood clotting too easily:        Skin    Rashes or ulcers:        Constitutional    Fever or chills:      PHYSICAL EXAMINATION:  Vitals:   01/11/21 1335  BP: 131/61  Pulse: (!) 57  Resp: 18  Temp: (!) 97.4 F (36.3 C)  TempSrc: Temporal  SpO2: 98%  Weight: 177 lb (80.3 kg)  Height: _2  (1.702 m)    General:  WDWN in NAD; vital signs documented above Gait: Normal, ambulates with walker HENT: WNL, normocephalic Pulmonary: normal non-labored breathing , without wheezing Cardiac: regular HR, without  Murmurs  Vascular Exam/Pulses:Brisk doppler AT/ PT signals Extremities: with ischemic changes of right distal 2nd and 3rd fingers with dry eschars, without Gangrene , without cellulitis; with open wound on dorsum of right TMA   Musculoskeletal: no muscle wasting or atrophy  Neurologic: A&O X 3;  No focal weakness or paresthesias are detected Psychiatric:  The pt has Normal affect.  ASSESSMENT/PLAN:: 87 y.o. male here for wound follow up.  He recently underwent I&D of right foot including skin, soft tissue, muscle, and tendon and placement  of wound vac on 12/17/2020 by Dr. Trula Slade. He was discharged home with wound VAC on right foot. Wound VAC was removed today and wound looks healthy. RLE is well perfused with brisk doppler signals. New VAC reapplied. He may no longer need wound VAC after next follow up but will depend on continued healing of wound - Continue Wound VAC changes per [facility] RN 3x/week - Advised  him to complete course of Abx- Bactrim - Continue Plavix and Eliquis - They will call for earlier follow up if any new concerns  -He will return in 2 weeks with RLE arterial duplex and wound check   Karoline Caldwell, PA-C [facility] [phone]  Clinic MD:Dr. Virl Cagey

## 2021-01-13 DIAGNOSIS — T8189XA Other complications of procedures, not elsewhere classified, initial encounter: Secondary | ICD-10-CM | POA: Diagnosis not present

## 2021-01-13 DIAGNOSIS — D649 Anemia, unspecified: Secondary | ICD-10-CM | POA: Diagnosis not present

## 2021-01-13 DIAGNOSIS — I2510 Atherosclerotic heart disease of native coronary artery without angina pectoris: Secondary | ICD-10-CM | POA: Diagnosis not present

## 2021-01-13 DIAGNOSIS — I480 Paroxysmal atrial fibrillation: Secondary | ICD-10-CM | POA: Diagnosis not present

## 2021-01-13 DIAGNOSIS — I110 Hypertensive heart disease with heart failure: Secondary | ICD-10-CM | POA: Diagnosis not present

## 2021-01-13 DIAGNOSIS — M47812 Spondylosis without myelopathy or radiculopathy, cervical region: Secondary | ICD-10-CM | POA: Diagnosis not present

## 2021-01-13 DIAGNOSIS — E1151 Type 2 diabetes mellitus with diabetic peripheral angiopathy without gangrene: Secondary | ICD-10-CM | POA: Diagnosis not present

## 2021-01-13 DIAGNOSIS — J449 Chronic obstructive pulmonary disease, unspecified: Secondary | ICD-10-CM | POA: Diagnosis not present

## 2021-01-13 DIAGNOSIS — I5032 Chronic diastolic (congestive) heart failure: Secondary | ICD-10-CM | POA: Diagnosis not present

## 2021-01-15 ENCOUNTER — Other Ambulatory Visit: Payer: Self-pay

## 2021-01-15 DIAGNOSIS — I480 Paroxysmal atrial fibrillation: Secondary | ICD-10-CM | POA: Diagnosis not present

## 2021-01-15 DIAGNOSIS — M47812 Spondylosis without myelopathy or radiculopathy, cervical region: Secondary | ICD-10-CM | POA: Diagnosis not present

## 2021-01-15 DIAGNOSIS — I2510 Atherosclerotic heart disease of native coronary artery without angina pectoris: Secondary | ICD-10-CM | POA: Diagnosis not present

## 2021-01-15 DIAGNOSIS — J449 Chronic obstructive pulmonary disease, unspecified: Secondary | ICD-10-CM | POA: Diagnosis not present

## 2021-01-15 DIAGNOSIS — I110 Hypertensive heart disease with heart failure: Secondary | ICD-10-CM | POA: Diagnosis not present

## 2021-01-15 DIAGNOSIS — D649 Anemia, unspecified: Secondary | ICD-10-CM | POA: Diagnosis not present

## 2021-01-15 DIAGNOSIS — T8189XA Other complications of procedures, not elsewhere classified, initial encounter: Secondary | ICD-10-CM | POA: Diagnosis not present

## 2021-01-15 DIAGNOSIS — E1151 Type 2 diabetes mellitus with diabetic peripheral angiopathy without gangrene: Secondary | ICD-10-CM | POA: Diagnosis not present

## 2021-01-15 DIAGNOSIS — I739 Peripheral vascular disease, unspecified: Secondary | ICD-10-CM

## 2021-01-15 DIAGNOSIS — I5032 Chronic diastolic (congestive) heart failure: Secondary | ICD-10-CM | POA: Diagnosis not present

## 2021-01-18 DIAGNOSIS — I5032 Chronic diastolic (congestive) heart failure: Secondary | ICD-10-CM | POA: Diagnosis not present

## 2021-01-18 DIAGNOSIS — E1151 Type 2 diabetes mellitus with diabetic peripheral angiopathy without gangrene: Secondary | ICD-10-CM | POA: Diagnosis not present

## 2021-01-18 DIAGNOSIS — J449 Chronic obstructive pulmonary disease, unspecified: Secondary | ICD-10-CM | POA: Diagnosis not present

## 2021-01-18 DIAGNOSIS — D649 Anemia, unspecified: Secondary | ICD-10-CM | POA: Diagnosis not present

## 2021-01-18 DIAGNOSIS — I480 Paroxysmal atrial fibrillation: Secondary | ICD-10-CM | POA: Diagnosis not present

## 2021-01-18 DIAGNOSIS — M47812 Spondylosis without myelopathy or radiculopathy, cervical region: Secondary | ICD-10-CM | POA: Diagnosis not present

## 2021-01-18 DIAGNOSIS — I2510 Atherosclerotic heart disease of native coronary artery without angina pectoris: Secondary | ICD-10-CM | POA: Diagnosis not present

## 2021-01-18 DIAGNOSIS — T8189XA Other complications of procedures, not elsewhere classified, initial encounter: Secondary | ICD-10-CM | POA: Diagnosis not present

## 2021-01-18 DIAGNOSIS — I110 Hypertensive heart disease with heart failure: Secondary | ICD-10-CM | POA: Diagnosis not present

## 2021-01-20 ENCOUNTER — Other Ambulatory Visit: Payer: Self-pay

## 2021-01-20 DIAGNOSIS — I480 Paroxysmal atrial fibrillation: Secondary | ICD-10-CM | POA: Diagnosis not present

## 2021-01-20 DIAGNOSIS — D649 Anemia, unspecified: Secondary | ICD-10-CM | POA: Diagnosis not present

## 2021-01-20 DIAGNOSIS — T8189XA Other complications of procedures, not elsewhere classified, initial encounter: Secondary | ICD-10-CM | POA: Diagnosis not present

## 2021-01-20 DIAGNOSIS — I2510 Atherosclerotic heart disease of native coronary artery without angina pectoris: Secondary | ICD-10-CM | POA: Diagnosis not present

## 2021-01-20 DIAGNOSIS — I5032 Chronic diastolic (congestive) heart failure: Secondary | ICD-10-CM | POA: Diagnosis not present

## 2021-01-20 DIAGNOSIS — M47812 Spondylosis without myelopathy or radiculopathy, cervical region: Secondary | ICD-10-CM | POA: Diagnosis not present

## 2021-01-20 DIAGNOSIS — I110 Hypertensive heart disease with heart failure: Secondary | ICD-10-CM | POA: Diagnosis not present

## 2021-01-20 DIAGNOSIS — J449 Chronic obstructive pulmonary disease, unspecified: Secondary | ICD-10-CM | POA: Diagnosis not present

## 2021-01-20 DIAGNOSIS — E1151 Type 2 diabetes mellitus with diabetic peripheral angiopathy without gangrene: Secondary | ICD-10-CM | POA: Diagnosis not present

## 2021-01-20 NOTE — Patient Outreach (Signed)
[facility]) Care Management  [place]  01/20/2021   Mike Wright [birth year] [id number]  Subjective: Telephone call to patient for follow up. Patient reports doing well and that he is proud to say his foot wound has made lots of progress.  Congratulated patient and encouraged continued monitoring for infection. Home health continues to change wound vac 3/wk.  Heart failure management continues.    Objective:   Encounter Medications:  Outpatient Encounter Medications as of 01/20/2021  Medication Sig   Accu-Chek Softclix Lancets lancets    acetaminophen (TYLENOL) 500 MG tablet Take 1 tablet (500 mg total) by mouth every 6 (six) hours as needed for mild pain or headache. (Patient taking differently: Take 1,000 mg by mouth every 6 (six) hours as needed for mild pain or headache.)   Alcohol Swabs (B-D SINGLE USE SWABS REGULAR) PADS    apixaban (ELIQUIS) 2.5 MG TABS tablet Take 1 tablet (2.5 mg total) by mouth 2 (two) times daily.   aspirin EC 81 MG EC tablet Take 1 tablet (81 mg total) by mouth daily. Swallow whole. (Patient not taking: Reported on 01/11/2021)   atorvastatin (LIPITOR) 80 MG tablet Take 80 mg by mouth every evening.    Blood Glucose Monitoring Suppl (ACCU-CHEK GUIDE) w/Device KIT    Cholecalciferol (VITAMIN D3) 50 MCG (2000 UT) TABS Take 2,000 Units by mouth daily.   clopidogrel (PLAVIX) 75 MG tablet Take 1 tablet (75 mg total) by mouth daily with breakfast.   Cyanocobalamin (B-12) 2500 MCG TABS Take 2,500 mcg by mouth daily.   DROPLET INSULIN SYRINGE 31G X 5/16" 1 ML MISC    folic acid (FOLVITE) 1 MG tablet Take 1 tablet (1 mg total) by mouth daily.   furosemide (LASIX) 80 MG tablet Take 80 mg by mouth See admin instructions. Take 80 mg by mouth in the morning and between 5-6 PM daily   insulin glargine (LANTUS) 100 UNIT/ML injection Inject 30-40 Units into the skin See admin instructions. Inject 40 units into the skin in the morning and 30  units at bedtime   ipratropium-albuterol (DUONEB) 0.5-2.5 (3) MG/3ML SOLN Take 3 mLs by nebulization every other day.   isosorbide mononitrate (IMDUR) 30 MG 24 hr tablet Take 1.5 tablets (45 mg total) by mouth in the morning and at bedtime.   lisinopril (ZESTRIL) 5 MG tablet Take 0.5 tablets (2.5 mg total) by mouth daily.   meclizine (ANTIVERT) 25 MG tablet Take 12.5 mg by mouth daily as needed for dizziness.   metFORMIN (GLUCOPHAGE) 1000 MG tablet Take 1,000 mg by mouth 2 (two) times daily.   nitroGLYCERIN (NITROSTAT) 0.4 MG SL tablet PLACE 1 TABLET (0.4 MG TOTAL) UNDER THE TONGUE EVERY 5  MINUTES AS NEEDED FOR CHEST PAIN.   ondansetron (ZOFRAN) 4 MG tablet Take 4 mg by mouth daily as needed for nausea/vomiting.   OXYGEN Inhale 2 L/min into the lungs See admin instructions. 2 L/min of oxygen at bedtime and during the day as needed for shortness of breath   pantoprazole (PROTONIX) 40 MG tablet Take 1 tablet (40 mg total) by mouth 2 (two) times daily. To protect stomach while taking multiple blood thinners. (Patient taking differently: Take 40 mg by mouth daily. To protect stomach while taking multiple blood thinners.)   potassium chloride SA (K-DUR) 20 MEQ tablet Take 1 tablet (20 mEq total) by mouth daily.   psyllium (METAMUCIL) 58.6 % packet Take 0.5 packets by mouth daily. [place] 18  MCG inhalation capsule Place 1 capsule into inhaler and inhale daily after breakfast.    sulfamethoxazole-trimethoprim (BACTRIM DS) 800-160 MG tablet Take 1 tablet by mouth 2 (two) times daily.   traMADol (ULTRAM) 50 MG tablet Take 1 tablet (50 mg total) by mouth every 6 (six) hours as needed.   traZODone (DESYREL) 100 MG tablet Take 150 mg by mouth at bedtime.   TRUE METRIX BLOOD GLUCOSE TEST test strip    No facility-administered encounter medications on file as of 01/20/2021.    Functional Status:  In your present state of health, do you have any difficulty performing the following  activities: 12/21/2020 12/15/2020  Hearing? N N  Vision? N N  Difficulty concentrating or making decisions? N N  Walking or climbing stairs? Y N  Comment needs assist of one person for stability -  Dressing or bathing? N N  Doing errands, shopping? Y N  Comment wife assists -  Conservation officer, nature and eating ? N -  Using the Toilet? N -  In the past six months, have you accidently leaked urine? N -  Comment - -  Do you have problems with loss of bowel control? N -  Managing your Medications? - -  Managing your Finances? N -  Housekeeping or managing your Housekeeping? Y -  Comment wife assists -  Some recent data might be hidden    Fall/Depression Screening: Fall Risk  12/21/2020 10/08/2020 03/10/2020  Falls in the past year? 0 1 1  Comment - - -  Number falls in past yr: - 1 1  Injury with Fall? - 0 0  Risk for fall due to : - Orthopedic patient;Impaired balance/gait Orthopedic patient  Follow up - Falls prevention discussed Falls prevention discussed   PHQ 2/9 Scores 12/21/2020 10/08/2020 02/10/2020 11/07/2019 07/29/2019 06/12/2019 12/20/2018  PHQ - 2 Score 0 0 0 0 0 0 0    Assessment:   Care Plan Care Plan : Heart Failure (Adult)  Updates made by Jon Billings, RN since 01/20/2021 12:00 AM     Problem: Symptom Exacerbation (Heart Failure)      Long-Range Goal: Symptom Exacerbation Prevented or Minimized as evidenced by patient continuing to monitor weights regularly.   Start Date: 02/10/2020  Expected End Date: 09/24/2021  This Visit's Progress: On track  Recent Progress: On track  Priority: High  Note:   Evidence-based guidance: Establish a mutually-agreed-upon early intervention process to communicate with primary care provider when signs/symptoms worsen.  Notes: 06/29/20   Patient weighs daily and is aware of worsening symptoms.   10/08/20 Patient continues to weight daily. Weight 184 lbs and trending upward.  Discussed continuing to monitor weights and limit salt intake due to  trending upward.   11/17/20 Patient weighing daily. Today's weight 179 lbs. Reiterated heart failure management.      Task: Identify and Minimize Risk of Heart Failure Exacerbation   Due Date: 09/24/2021  Priority: Routine  Responsible User: Jon Billings, RN  Note:   Care Management Activities:    - rescue (action) plan reviewed - self-awareness of signs/symptoms of worsening disease encouraged    Notes: 05-01-20 Patient knows signs of heart failure and when to notify physician.  06/29/20 Reiterated signs of heart failure and importance of weights.   10/08/20 Patient continues to weight daily. Weight 184 lbs and trending upward.  Discussed continuing to monitor weights and limit salt intake due to trending upward.   Heart Failure Management Discussed Please weight daily or as ordered by  your doctor Report to your doctor weight gain of 2 -3 pounds in a day or 5 pounds in a week Limit salt intake Monitor for shortness of breath, swelling of feet, ankles or abdomen and weight gain. Never use the saltshaker.  Read all food labels and avoid canned, processed, and pickled foods.   Follow your doctor's recommendations for daily salt intake. 11/17/20 Patient weighing daily. Today's weight 179 lbs. Reiterated heart failure management.   12/21/20 Daily weights continue.  Reiterated heart failure management.  Weight 172lbs.    01/04/21 Patient reports he is doing well. Weight staying around 177 lbs or less. Reiterated heart failure management.  No concerns. 01/20/21 Patient weight 175 lbs.  Patient continues to follow a low salt diet. Reviewed heart failure management. No concerns.        Goals Addressed               This Visit's Progress     THN-Monitor foot wound for infection (pt-stated)   On track     Barriers: Knowledge Timeframe:  Short-Term Goal Priority:  High Start Date:   03/24/20                          Expected End Date:   04/27/20  Follow Up Date 02/24/21  - clean and dry  skin well  Continue wound vac as prescribed by physician.           Why is this important?   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   Taking really good care of your skin will help to keep your skin unbroken.    Notes: Patient denies signs of infection.  Wet to dry dressings daily. Wound closing up slowly.  Home health nurse 2/week. 04/15/20-patient with recent debridement of right foot surgical site.  Area now red with some bleeding to site per patient.  Santyl dressing daily. 05/02/20 Patient reports wound is doing good.  He did have a bleeding episode that was stopped with pressure to site. Home health continues 2/wk visits.   06/29/20 Wound almost healed.  Discussed continued use of boot to relieve pressure. 09/11/20 Wound almost healed.  Patient careful not to wear anything on foot that causes pressure. Wound Management Discussed Take antibiotics as prescribed Monitor for swelling, redness, pain, pus, and fever Keep wound clean and dry.   Notify physician immediately for any changes   10/08/20 Patient reports only a small area open now and just using a Band-Aid to cover.  Discussed wearing shoes and avoid wearing shoes that would apply pressure to area.   Wound Management Reiterated Monitor for swelling, redness, pain, pus, and fever Keep wound clean and dry.   Notify physician immediately for any changes  11/17/20 Patient still has only a small area open on right foot. Reviewed importance of wound monitoring for infection or worsening.   12/21/20 Patient with I& D  to right foot.  Has wound vac to foot with close follow up with physician and home health.  Reiterated signs of infection and when to notify physician.   01/04/21 Patient reports that the wound vac malfunctioned over the weekend. Waiting for a new wound vac to come in.  Wet to dry dressing being used now. Patient denies any signs of infection.  Patient to see surgeon on 01/11/21 for wound check. No transportation  problems.  01/20/21 Patient wound has made good progress per patient.  Wound vac continues.  Home health nurse continues 3/wk.  Reiterated signs of infection.          THN-Track and Manage Symptoms (pt-stated)   On track     Barriers: Health Behaviors Knowledge Timeframe:  Long-Range Goal Priority:  High Start Date:  03/24/20                           Expected End Date: 09/24/21  Follow up 02/24/21  - follow rescue plan if symptoms flare-up - know when to call the doctor    Why is this important?   You will be able to handle your symptoms better if you keep track of them.  Making some simple changes to your lifestyle will help.  Eating healthy is one thing you can do to take good care of yourself.    Notes: 04/15/20 Weight 177 lbs.  05-02-20 Patient weight 179 lbs.   06/29/20 Patient weight 180 lbs. Keep up the great work! 09/11/20 Weight 182 lbs.  Continue weighing. Heart Failure Management Discussed Please weight daily or as ordered by your doctor Report to your doctor weight gain of 2 -3 pounds in a day or 5 pounds in a week Limit salt intake Monitor for shortness of breath, swelling of feet, ankles or abdomen and weight gain  10/08/20 Patient continues to weight daily. Weight 184 lbs and trending upward.  Discussed continuing to monitor weights and limit salt intake due to trending upward.   Heart Failure Management Reiterated Please weight daily or as ordered by your doctor Report to your doctor weight gain of 2 -3 pounds in a day or 5 pounds in a week Limit salt intake Monitor for shortness of breath, swelling of feet, ankles or abdomen and weight gain. Never use the saltshaker.  Read all food labels and avoid canned, processed, and pickled foods.   Follow your doctor's recommendations for daily salt intake. 11/17/20 Patient reports weight 179 lbs. Reviewed heart failure management and importance.  Patient continues to weigh daily. 12/21/20 Patient continues daily weights.   Weight 172 lbs. Reiterated health failure management.  Keep up the great work.  01/04/21 Patient weight 177 lbs or less. Discussed heart failure management.  No concerns.   01/20/21 Patient weight 175 lbs. Encouraged continued heart failure management.  No concerns.          Plan:  Follow-up: Patient agrees to Care Plan and Follow-up. Follow-up in 4-6 week(s) [name], RN, MSN [place] Management Care Management Coordinator Direct Line [phone] Cell [phone] Toll Free: [phone]  Fax: [phone]

## 2021-01-21 DIAGNOSIS — I509 Heart failure, unspecified: Secondary | ICD-10-CM | POA: Diagnosis not present

## 2021-01-22 DIAGNOSIS — J449 Chronic obstructive pulmonary disease, unspecified: Secondary | ICD-10-CM | POA: Diagnosis not present

## 2021-01-22 DIAGNOSIS — I110 Hypertensive heart disease with heart failure: Secondary | ICD-10-CM | POA: Diagnosis not present

## 2021-01-22 DIAGNOSIS — I2510 Atherosclerotic heart disease of native coronary artery without angina pectoris: Secondary | ICD-10-CM | POA: Diagnosis not present

## 2021-01-22 DIAGNOSIS — D649 Anemia, unspecified: Secondary | ICD-10-CM | POA: Diagnosis not present

## 2021-01-22 DIAGNOSIS — I480 Paroxysmal atrial fibrillation: Secondary | ICD-10-CM | POA: Diagnosis not present

## 2021-01-22 DIAGNOSIS — T8189XA Other complications of procedures, not elsewhere classified, initial encounter: Secondary | ICD-10-CM | POA: Diagnosis not present

## 2021-01-22 DIAGNOSIS — E1151 Type 2 diabetes mellitus with diabetic peripheral angiopathy without gangrene: Secondary | ICD-10-CM | POA: Diagnosis not present

## 2021-01-22 DIAGNOSIS — I5032 Chronic diastolic (congestive) heart failure: Secondary | ICD-10-CM | POA: Diagnosis not present

## 2021-01-22 DIAGNOSIS — M47812 Spondylosis without myelopathy or radiculopathy, cervical region: Secondary | ICD-10-CM | POA: Diagnosis not present

## 2021-01-25 ENCOUNTER — Other Ambulatory Visit: Payer: Self-pay

## 2021-01-25 ENCOUNTER — Ambulatory Visit (HOSPITAL_COMMUNITY)
Admission: RE | Admit: 2021-01-25 | Discharge: 2021-01-25 | Disposition: A | Discharge status: Home / Self Care | Payer: Medicare HMO | Source: Ambulatory Visit | Attending: Surgery | Admitting: Surgery

## 2021-01-25 ENCOUNTER — Ambulatory Visit (INDEPENDENT_AMBULATORY_CARE_PROVIDER_SITE_OTHER)
Admission: RE | Admit: 2021-01-25 | Discharge: 2021-01-25 | Disposition: A | Discharge status: Home / Self Care | Payer: Medicare HMO | Source: Ambulatory Visit | Attending: Surgery | Admitting: Surgery

## 2021-01-25 ENCOUNTER — Other Ambulatory Visit: Payer: Self-pay | Admitting: Cardiology

## 2021-01-25 ENCOUNTER — Ambulatory Visit (INDEPENDENT_AMBULATORY_CARE_PROVIDER_SITE_OTHER): Payer: Medicare HMO | Admitting: Physician Assistant

## 2021-01-25 VITALS — BP 123/66 | HR 77 | Temp 98.2°F | Resp 20 | Ht 67.0 in | Wt 177.3 lb

## 2021-01-25 DIAGNOSIS — I739 Peripheral vascular disease, unspecified: Secondary | ICD-10-CM

## 2021-01-25 NOTE — Progress Notes (Signed)
POST OPERATIVE OFFICE NOTE    CC:  F/u for surgery  HPI:  This is a 87 y.o. male who presents for wound follow up. He recently underwent I&D of right foot including skin, soft tissue, muscle, and tendon and placement of wound vac on 12/17/2020 by Dr. Trula Slade. He was discharged home with wound VAC on right foot. Home health has been changing it on MWF. He was last seen here on 12/21/20 at which time wound VAC was changed and well appearing healthy wound. He was continued on Bactrim. He was instructed to continue Plavix and Eliquis.  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  Prior to surgery he had a diagnostic arteriogram which demonstrated diffuse tibial disease however but no treatable flow-limiting stenosis at the time.  The patient and his wife state they had a blister appearing on the dorsal foot in the past few weeks which has slowly worsened.  He presented to his primary doctor yesterday after the blister burst and he encountered bleeding from the wound bed.  He also has redness ascending up his ankle.  He was started on Bactrim and referred back to our office today.  The patient denies any pain to his foot.  He also denies any chest pain, fevers, chills, nausea/vomiting.  He also has diabetes.    He is on Eliquis for afib and Plavix for PAD  He was last seen on 01/11/2021 and the wound was looking healthy at that time.  He had brisk doppler signals.  He was advised to complete his abx.    Pt returns today for follow up.  Pt states he has been doing well.  He states his wound is doing ok and he feels it is getting smaller.    Allergies  Allergen Reactions   Bactrim [Sulfamethoxazole-Trimethoprim] Other (See Comments)    Pancytopenia   Codeine Shortness Of Breath   Doxycycline Swelling    Swelling and numbness in lips and face. Swelling improved after stopping. Reports still experiences numbness in bottom lip.    Feraheme [Ferumoxytol] Other (See  Comments)    Diaphoretic, chest pain   Heparin Other (See Comments)    +HIT,  Severe bleeding (with heparin drip & large doses), tolerates low doses   Losartan Swelling   Oxycodone Other (See Comments)    "Made me act out of my mind" Mental status changes- hallucinations   Latex Rash    Current Outpatient Medications  Medication Sig Dispense Refill   Accu-Chek Softclix Lancets lancets      acetaminophen (TYLENOL) 500 MG tablet Take 1 tablet (500 mg total) by mouth every 6 (six) hours as needed for mild pain or headache. (Patient taking differently: Take 1,000 mg by mouth every 6 (six) hours as needed for mild pain or headache.) 30 tablet 0   Alcohol Swabs (B-D SINGLE USE SWABS REGULAR) PADS      apixaban (ELIQUIS) 2.5 MG TABS tablet Take 1 tablet (2.5 mg total) by mouth 2 (two) times daily. 60 tablet 11   aspirin EC 81 MG EC tablet Take 1 tablet (81 mg total) by mouth daily. Swallow whole. 30 tablet 11   atorvastatin (LIPITOR) 80 MG tablet Take 80 mg by mouth every evening.      Blood Glucose Monitoring Suppl (ACCU-CHEK GUIDE) w/Device KIT      Cholecalciferol (VITAMIN D3) 50 MCG (2000 UT) TABS Take 2,000 Units by mouth daily.     clopidogrel (PLAVIX) 75  MG tablet Take 1 tablet (75 mg total) by mouth daily with breakfast. 30 tablet 2   Cyanocobalamin (B-12) 2500 MCG TABS Take 2,500 mcg by mouth daily.     DROPLET INSULIN SYRINGE 31G X 5/16" 1 ML MISC      folic acid (FOLVITE) 1 MG tablet Take 1 tablet (1 mg total) by mouth daily. 30 tablet 1   furosemide (LASIX) 80 MG tablet Take 80 mg by mouth See admin instructions. Take 80 mg by mouth in the morning and between 5-6 PM daily     insulin glargine (LANTUS) 100 UNIT/ML injection Inject 30-40 Units into the skin See admin instructions. Inject 40 units into the skin in the morning and 30 units at bedtime     ipratropium-albuterol (DUONEB) 0.5-2.5 (3) MG/3ML SOLN Take 3 mLs by nebulization every other day.     isosorbide mononitrate (IMDUR)  30 MG 24 hr tablet TAKE 1 AND 1/2 TABLETS IN THE MORNING AND AT BEDTIME (DOSE INCREASE) 270 tablet 3   lisinopril (ZESTRIL) 5 MG tablet Take 0.5 tablets (2.5 mg total) by mouth daily.     meclizine (ANTIVERT) 25 MG tablet Take 12.5 mg by mouth daily as needed for dizziness.     metFORMIN (GLUCOPHAGE) 1000 MG tablet Take 1,000 mg by mouth 2 (two) times daily.     nitroGLYCERIN (NITROSTAT) 0.4 MG SL tablet PLACE 1 TABLET (0.4 MG TOTAL) UNDER THE TONGUE EVERY 5  MINUTES AS NEEDED FOR CHEST PAIN. 25 tablet 3   ondansetron (ZOFRAN) 4 MG tablet Take 4 mg by mouth daily as needed for nausea/vomiting.     OXYGEN Inhale 2 L/min into the lungs See admin instructions. 2 L/min of oxygen at bedtime and during the day as needed for shortness of breath     pantoprazole (PROTONIX) 40 MG tablet Take 1 tablet (40 mg total) by mouth 2 (two) times daily. To protect stomach while taking multiple blood thinners. (Patient taking differently: Take 40 mg by mouth daily. To protect stomach while taking multiple blood thinners.) 60 tablet 2   potassium chloride SA (K-DUR) 20 MEQ tablet Take 1 tablet (20 mEq total) by mouth daily. 30 tablet 1   psyllium (METAMUCIL) 58.6 % packet Take 0.5 packets by mouth daily. [place] 18 MCG inhalation capsule Place 1 capsule into inhaler and inhale daily after breakfast.      sulfamethoxazole-trimethoprim (BACTRIM DS) 800-160 MG tablet Take 1 tablet by mouth 2 (two) times daily. 42 tablet 0   traMADol (ULTRAM) 50 MG tablet Take 1 tablet (50 mg total) by mouth every 6 (six) hours as needed. 20 tablet 0   traZODone (DESYREL) 100 MG tablet Take 150 mg by mouth at bedtime.     TRUE METRIX BLOOD GLUCOSE TEST test strip      No current facility-administered medications for this visit.     ROS:  See HPI  Physical Exam:  Today's Vitals   01/25/21 1430  BP: 123/66  Pulse: 77  Resp: 20  Temp: 98.2 F (36.8 C)  TempSrc: Temporal  SpO2: 97%  Weight: 177 lb 4.8  oz (80.4 kg)  Height: 5' 7"  (1.702 m)   Body mass index is 27.77 kg/m.   Incision:     Extremities:  brisk doppler signal right PT and peroneal    RLE arterial duplex 01/25/2021: +-----------+--------+-----+--------+----------+---------------------------  --+  RIGHT      PSV cm/sRatioStenosisWaveform  Comments                    +-----------+--------+-----+--------+----------+---------------------------  CFA Distal 117                  biphasic                              +-----------+--------+-----+--------+----------+---------------------------  DFA        111                  biphasic                              +-----------+--------+-----+--------+----------+---------------------------  SFA Prox   97                   biphasic                              +-----------+--------+-----+--------+----------+---------------------------  SFA Mid    195                  triphasic                             +-----------+--------+-----+--------+----------+---------------------------  SFA Distal 177                  triphasic                             +-----------+--------+-----+--------+----------+---------------------------  POP Prox   75                   biphasic                              +-----------+--------+-----+--------+----------+---------------------------  POP Distal 52                   biphasic                              +-----------+--------+-----+--------+----------+---------------------------  ATA Distal 30                             occluded with collateral flow  +-----------+--------+-----+--------+----------+---------------------------  PTA Distal 103                  monophasic                            +-----------+--------+-----+--------+----------+---------------------------  PERO Distal                     absent                                 +-----------+--------+-----+--------+----------+---------------------------   Right Stent(s):  +---------------+--------+--------+--------+--------+  SFA proximal   PSV cm/sStenosisWaveformComments  +---------------+--------+--------+--------+--------+  Prox to Stent  82              biphasic          +---------------+--------+--------+--------+--------+  Proximal Stent 82              biphasic          +---------------+--------+--------+--------+--------+  Mid Stent  78              biphasic          +---------------+--------+--------+--------+--------+  Distal Stent   96              biphasic          +---------------+--------+--------+--------+--------+  Distal to Stent93              biphasic          +---------------+--------+--------+--------+--------+   ABI 01/25/2021 +-------+-----------+-----------+------------+------------+  ABI/TBIToday's ABIToday's TBIPrevious ABIPrevious TBI  +-------+-----------+-----------+------------+------------+  Right  1.05                  1.02                      +-------+-----------+-----------+------------+------------+  Left   1.23                  1.16                      +-------+-----------+-----------+------------+------------+    Assessment/Plan:  This is a 87 y.o. male who is s/p: I&D of right foot including skin, soft tissue, muscle, and tendon and placement of wound vac on 12/17/2020 by Dr. Trula Slade.   -pt seen with Dr. Trula Slade.  Wound is improving.  He discussed with pt and his wife that we will try wet to dry dressings daily.  If there is too much drainage and is soiling his bedding, we can go back to the vac.  We should know in the next couple of days if he will need the vac.   -he has very brisk doppler signals right peroneal and PT.   -I showed his wife how to do the wet to dry dressing.  She was also given supplies.  -he will f/u in 3 weeks and they know to call sooner  if there are any issues before then   Leontine Locket, [facility] [phone]   Clinic MD:  pt seen with Dr. Trula Slade.

## 2021-01-26 DIAGNOSIS — I350 Nonrheumatic aortic (valve) stenosis: Secondary | ICD-10-CM | POA: Diagnosis not present

## 2021-01-26 DIAGNOSIS — Z23 Encounter for immunization: Secondary | ICD-10-CM | POA: Diagnosis not present

## 2021-01-26 DIAGNOSIS — E1165 Type 2 diabetes mellitus with hyperglycemia: Secondary | ICD-10-CM | POA: Diagnosis not present

## 2021-01-26 DIAGNOSIS — S98911D Complete traumatic amputation of right foot, level unspecified, subsequent encounter: Secondary | ICD-10-CM | POA: Diagnosis not present

## 2021-01-26 DIAGNOSIS — Z299 Encounter for prophylactic measures, unspecified: Secondary | ICD-10-CM | POA: Diagnosis not present

## 2021-01-26 DIAGNOSIS — I10 Essential (primary) hypertension: Secondary | ICD-10-CM | POA: Diagnosis not present

## 2021-01-26 DIAGNOSIS — Z6828 Body mass index (BMI) 28.0-28.9, adult: Secondary | ICD-10-CM | POA: Diagnosis not present

## 2021-01-27 DIAGNOSIS — T8189XA Other complications of procedures, not elsewhere classified, initial encounter: Secondary | ICD-10-CM | POA: Diagnosis not present

## 2021-01-27 DIAGNOSIS — M47812 Spondylosis without myelopathy or radiculopathy, cervical region: Secondary | ICD-10-CM | POA: Diagnosis not present

## 2021-01-27 DIAGNOSIS — I5032 Chronic diastolic (congestive) heart failure: Secondary | ICD-10-CM | POA: Diagnosis not present

## 2021-01-27 DIAGNOSIS — E1151 Type 2 diabetes mellitus with diabetic peripheral angiopathy without gangrene: Secondary | ICD-10-CM | POA: Diagnosis not present

## 2021-01-27 DIAGNOSIS — I110 Hypertensive heart disease with heart failure: Secondary | ICD-10-CM | POA: Diagnosis not present

## 2021-01-27 DIAGNOSIS — I480 Paroxysmal atrial fibrillation: Secondary | ICD-10-CM | POA: Diagnosis not present

## 2021-01-27 DIAGNOSIS — D649 Anemia, unspecified: Secondary | ICD-10-CM | POA: Diagnosis not present

## 2021-01-27 DIAGNOSIS — J449 Chronic obstructive pulmonary disease, unspecified: Secondary | ICD-10-CM | POA: Diagnosis not present

## 2021-01-27 DIAGNOSIS — I2510 Atherosclerotic heart disease of native coronary artery without angina pectoris: Secondary | ICD-10-CM | POA: Diagnosis not present

## 2021-01-29 DIAGNOSIS — E1151 Type 2 diabetes mellitus with diabetic peripheral angiopathy without gangrene: Secondary | ICD-10-CM | POA: Diagnosis not present

## 2021-01-29 DIAGNOSIS — D649 Anemia, unspecified: Secondary | ICD-10-CM | POA: Diagnosis not present

## 2021-01-29 DIAGNOSIS — I110 Hypertensive heart disease with heart failure: Secondary | ICD-10-CM | POA: Diagnosis not present

## 2021-01-29 DIAGNOSIS — T8189XA Other complications of procedures, not elsewhere classified, initial encounter: Secondary | ICD-10-CM | POA: Diagnosis not present

## 2021-01-29 DIAGNOSIS — I5032 Chronic diastolic (congestive) heart failure: Secondary | ICD-10-CM | POA: Diagnosis not present

## 2021-01-29 DIAGNOSIS — I2510 Atherosclerotic heart disease of native coronary artery without angina pectoris: Secondary | ICD-10-CM | POA: Diagnosis not present

## 2021-01-29 DIAGNOSIS — J449 Chronic obstructive pulmonary disease, unspecified: Secondary | ICD-10-CM | POA: Diagnosis not present

## 2021-01-29 DIAGNOSIS — I480 Paroxysmal atrial fibrillation: Secondary | ICD-10-CM | POA: Diagnosis not present

## 2021-01-29 DIAGNOSIS — M47812 Spondylosis without myelopathy or radiculopathy, cervical region: Secondary | ICD-10-CM | POA: Diagnosis not present

## 2021-02-01 DIAGNOSIS — J449 Chronic obstructive pulmonary disease, unspecified: Secondary | ICD-10-CM | POA: Diagnosis not present

## 2021-02-01 DIAGNOSIS — D649 Anemia, unspecified: Secondary | ICD-10-CM | POA: Diagnosis not present

## 2021-02-01 DIAGNOSIS — E1151 Type 2 diabetes mellitus with diabetic peripheral angiopathy without gangrene: Secondary | ICD-10-CM | POA: Diagnosis not present

## 2021-02-01 DIAGNOSIS — T8189XA Other complications of procedures, not elsewhere classified, initial encounter: Secondary | ICD-10-CM | POA: Diagnosis not present

## 2021-02-01 DIAGNOSIS — I5032 Chronic diastolic (congestive) heart failure: Secondary | ICD-10-CM | POA: Diagnosis not present

## 2021-02-01 DIAGNOSIS — M47812 Spondylosis without myelopathy or radiculopathy, cervical region: Secondary | ICD-10-CM | POA: Diagnosis not present

## 2021-02-01 DIAGNOSIS — I480 Paroxysmal atrial fibrillation: Secondary | ICD-10-CM | POA: Diagnosis not present

## 2021-02-01 DIAGNOSIS — I2510 Atherosclerotic heart disease of native coronary artery without angina pectoris: Secondary | ICD-10-CM | POA: Diagnosis not present

## 2021-02-01 DIAGNOSIS — I110 Hypertensive heart disease with heart failure: Secondary | ICD-10-CM | POA: Diagnosis not present

## 2021-02-03 DIAGNOSIS — E1151 Type 2 diabetes mellitus with diabetic peripheral angiopathy without gangrene: Secondary | ICD-10-CM | POA: Diagnosis not present

## 2021-02-03 DIAGNOSIS — I110 Hypertensive heart disease with heart failure: Secondary | ICD-10-CM | POA: Diagnosis not present

## 2021-02-03 DIAGNOSIS — M47812 Spondylosis without myelopathy or radiculopathy, cervical region: Secondary | ICD-10-CM | POA: Diagnosis not present

## 2021-02-03 DIAGNOSIS — J449 Chronic obstructive pulmonary disease, unspecified: Secondary | ICD-10-CM | POA: Diagnosis not present

## 2021-02-03 DIAGNOSIS — T8189XA Other complications of procedures, not elsewhere classified, initial encounter: Secondary | ICD-10-CM | POA: Diagnosis not present

## 2021-02-03 DIAGNOSIS — I5032 Chronic diastolic (congestive) heart failure: Secondary | ICD-10-CM | POA: Diagnosis not present

## 2021-02-03 DIAGNOSIS — I480 Paroxysmal atrial fibrillation: Secondary | ICD-10-CM | POA: Diagnosis not present

## 2021-02-03 DIAGNOSIS — D649 Anemia, unspecified: Secondary | ICD-10-CM | POA: Diagnosis not present

## 2021-02-03 DIAGNOSIS — I2510 Atherosclerotic heart disease of native coronary artery without angina pectoris: Secondary | ICD-10-CM | POA: Diagnosis not present

## 2021-02-05 DIAGNOSIS — J449 Chronic obstructive pulmonary disease, unspecified: Secondary | ICD-10-CM | POA: Diagnosis not present

## 2021-02-05 DIAGNOSIS — I110 Hypertensive heart disease with heart failure: Secondary | ICD-10-CM | POA: Diagnosis not present

## 2021-02-05 DIAGNOSIS — T8189XA Other complications of procedures, not elsewhere classified, initial encounter: Secondary | ICD-10-CM | POA: Diagnosis not present

## 2021-02-05 DIAGNOSIS — I5032 Chronic diastolic (congestive) heart failure: Secondary | ICD-10-CM | POA: Diagnosis not present

## 2021-02-05 DIAGNOSIS — D649 Anemia, unspecified: Secondary | ICD-10-CM | POA: Diagnosis not present

## 2021-02-05 DIAGNOSIS — I480 Paroxysmal atrial fibrillation: Secondary | ICD-10-CM | POA: Diagnosis not present

## 2021-02-05 DIAGNOSIS — E1151 Type 2 diabetes mellitus with diabetic peripheral angiopathy without gangrene: Secondary | ICD-10-CM | POA: Diagnosis not present

## 2021-02-05 DIAGNOSIS — I2510 Atherosclerotic heart disease of native coronary artery without angina pectoris: Secondary | ICD-10-CM | POA: Diagnosis not present

## 2021-02-05 DIAGNOSIS — M47812 Spondylosis without myelopathy or radiculopathy, cervical region: Secondary | ICD-10-CM | POA: Diagnosis not present

## 2021-02-08 DIAGNOSIS — I2510 Atherosclerotic heart disease of native coronary artery without angina pectoris: Secondary | ICD-10-CM | POA: Diagnosis not present

## 2021-02-08 DIAGNOSIS — I5032 Chronic diastolic (congestive) heart failure: Secondary | ICD-10-CM | POA: Diagnosis not present

## 2021-02-08 DIAGNOSIS — E1151 Type 2 diabetes mellitus with diabetic peripheral angiopathy without gangrene: Secondary | ICD-10-CM | POA: Diagnosis not present

## 2021-02-08 DIAGNOSIS — I110 Hypertensive heart disease with heart failure: Secondary | ICD-10-CM | POA: Diagnosis not present

## 2021-02-08 DIAGNOSIS — J449 Chronic obstructive pulmonary disease, unspecified: Secondary | ICD-10-CM | POA: Diagnosis not present

## 2021-02-08 DIAGNOSIS — T8189XA Other complications of procedures, not elsewhere classified, initial encounter: Secondary | ICD-10-CM | POA: Diagnosis not present

## 2021-02-08 DIAGNOSIS — M47812 Spondylosis without myelopathy or radiculopathy, cervical region: Secondary | ICD-10-CM | POA: Diagnosis not present

## 2021-02-08 DIAGNOSIS — I480 Paroxysmal atrial fibrillation: Secondary | ICD-10-CM | POA: Diagnosis not present

## 2021-02-08 DIAGNOSIS — D649 Anemia, unspecified: Secondary | ICD-10-CM | POA: Diagnosis not present

## 2021-02-10 DIAGNOSIS — E1151 Type 2 diabetes mellitus with diabetic peripheral angiopathy without gangrene: Secondary | ICD-10-CM | POA: Diagnosis not present

## 2021-02-10 DIAGNOSIS — D649 Anemia, unspecified: Secondary | ICD-10-CM | POA: Diagnosis not present

## 2021-02-10 DIAGNOSIS — M47812 Spondylosis without myelopathy or radiculopathy, cervical region: Secondary | ICD-10-CM | POA: Diagnosis not present

## 2021-02-10 DIAGNOSIS — T8189XA Other complications of procedures, not elsewhere classified, initial encounter: Secondary | ICD-10-CM | POA: Diagnosis not present

## 2021-02-10 DIAGNOSIS — I2510 Atherosclerotic heart disease of native coronary artery without angina pectoris: Secondary | ICD-10-CM | POA: Diagnosis not present

## 2021-02-10 DIAGNOSIS — I5032 Chronic diastolic (congestive) heart failure: Secondary | ICD-10-CM | POA: Diagnosis not present

## 2021-02-10 DIAGNOSIS — I110 Hypertensive heart disease with heart failure: Secondary | ICD-10-CM | POA: Diagnosis not present

## 2021-02-10 DIAGNOSIS — J449 Chronic obstructive pulmonary disease, unspecified: Secondary | ICD-10-CM | POA: Diagnosis not present

## 2021-02-10 DIAGNOSIS — I480 Paroxysmal atrial fibrillation: Secondary | ICD-10-CM | POA: Diagnosis not present

## 2021-02-12 DIAGNOSIS — J449 Chronic obstructive pulmonary disease, unspecified: Secondary | ICD-10-CM | POA: Diagnosis not present

## 2021-02-12 DIAGNOSIS — I480 Paroxysmal atrial fibrillation: Secondary | ICD-10-CM | POA: Diagnosis not present

## 2021-02-12 DIAGNOSIS — I5032 Chronic diastolic (congestive) heart failure: Secondary | ICD-10-CM | POA: Diagnosis not present

## 2021-02-12 DIAGNOSIS — I110 Hypertensive heart disease with heart failure: Secondary | ICD-10-CM | POA: Diagnosis not present

## 2021-02-12 DIAGNOSIS — M47812 Spondylosis without myelopathy or radiculopathy, cervical region: Secondary | ICD-10-CM | POA: Diagnosis not present

## 2021-02-12 DIAGNOSIS — T8189XA Other complications of procedures, not elsewhere classified, initial encounter: Secondary | ICD-10-CM | POA: Diagnosis not present

## 2021-02-12 DIAGNOSIS — I2510 Atherosclerotic heart disease of native coronary artery without angina pectoris: Secondary | ICD-10-CM | POA: Diagnosis not present

## 2021-02-12 DIAGNOSIS — D649 Anemia, unspecified: Secondary | ICD-10-CM | POA: Diagnosis not present

## 2021-02-12 DIAGNOSIS — E1151 Type 2 diabetes mellitus with diabetic peripheral angiopathy without gangrene: Secondary | ICD-10-CM | POA: Diagnosis not present

## 2021-02-15 ENCOUNTER — Ambulatory Visit (INDEPENDENT_AMBULATORY_CARE_PROVIDER_SITE_OTHER): Payer: Medicare HMO | Admitting: Physician Assistant

## 2021-02-15 ENCOUNTER — Other Ambulatory Visit: Payer: Self-pay

## 2021-02-15 VITALS — BP 120/66 | HR 78 | Temp 97.6°F | Resp 20 | Ht 67.0 in | Wt 177.0 lb

## 2021-02-15 DIAGNOSIS — T8189XD Other complications of procedures, not elsewhere classified, subsequent encounter: Secondary | ICD-10-CM

## 2021-02-15 DIAGNOSIS — I739 Peripheral vascular disease, unspecified: Secondary | ICD-10-CM

## 2021-02-15 NOTE — Progress Notes (Signed)
Office Note     CC:  follow up Requesting Provider:  Monico Blitz, MD  HPI: Mike Wright is a 87 y.o. (1934-01-30) male who presents for wound check. He is s/p I&D or his right foot wound.  Prior to that he underwent stenting of the proximal third of his right SFA.  He has since discontinued the wound VAC and has been performing wet-to-dry dressing changes.  He has a home health nurse come to the house 3 times a week for wound care.  He denies any pain to the wound.  He and his wife believe it is improving.  He is on Eliquis and aspirin daily.   Past Medical History:  Diagnosis Date   Anemia    a. mild, noted 04/2017.   CAD in native artery    a. [name] 04/2017 s/p DES to D1, DES to prox-mid LAD, DES to prox LAD overlapping the prior stent, LVEF 55-65%.    Chronic diastolic CHF (congestive heart failure) (HCC)    Constipation    COPD (chronic obstructive pulmonary disease) (HCC)    Diabetic ulcer of toe (HCC)    DJD (degenerative joint disease) of cervical spine    Dysrhythmia    AFib   Essential hypertension    GERD (gastroesophageal reflux disease)    History of hiatal hernia    HIT (heparin-induced thrombocytopenia)    Hypothyroidism    Hypoxia    a. went home on home O2 04/2017.   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   Retinitis    Sinus bradycardia    a. HR 30s-40s in 04/2017 -> diltiazem stopped, metoprolol reduced.   Sleep apnea    "chose not to order CPAP at this time" (05/18/2017)   Type 2 diabetes mellitus ([place])    Wears glasses    Wheezing    a. suspected COPD 04/2017. Former tobacco x 40 years.    Past Surgical History:  Procedure Laterality Date   ABDOMINAL AORTOGRAM W/LOWER EXTREMITY N/A 09/15/2017   Procedure: ABDOMINAL AORTOGRAM W/LOWER EXTREMITY;  Surgeon:  Serafina Mitchell, MD;  Location: [name];  Service: Cardiovascular;  Laterality: N/A;   ABDOMINAL AORTOGRAM W/LOWER EXTREMITY N/A 02/04/2020   Procedure: ABDOMINAL AORTOGRAM W/LOWER EXTREMITY;  Surgeon: Serafina Mitchell, MD;  Location: [facility];  Service: Cardiovascular;  Laterality: N/A;   ABDOMINAL AORTOGRAM W/LOWER EXTREMITY N/A 12/15/2020   Procedure: ABDOMINAL AORTOGRAM W/LOWER EXTREMITY;  Surgeon: Serafina Mitchell, MD;  Location: [facility];  Service: Cardiovascular;  Laterality: N/A;   AMPUTATION Bilateral 09/20/2017   Procedure: BILATERAL GREAT TOE AMPUTATIONS INCLUDING METATARSAL HEADS;  Surgeon: Serafina Mitchell, MD;  Location: [place];  Service: Vascular;  Laterality: Bilateral;   AMPUTATION Right 03/01/2019   Procedure: AMPUTATION TOES;  Surgeon: Angelia Mould, MD;  Location: [place];  Service: Vascular;  Laterality: Right;   AMPUTATION Right 05/29/2019   Procedure: AMPUTATION RIGHT THIRD TOE;  Surgeon: Serafina Mitchell, MD;  Location: [facility];  Service: Vascular;  Laterality: Right;   AMPUTATION Right 09/26/2019   Procedure: AMPUTATION RIGHT FOURTH TOE AND FIFTH TOES;  Surgeon: Serafina Mitchell, MD;  Location: [place];  Service: Vascular;  Laterality: Right;   ANGIOPLASTY Right 09/20/2017  Procedure: ANGIOPLASTY RIGHT TIBIAL ARTERY;  Surgeon: Serafina Mitchell, MD;  Location: [place];  Service: Vascular;  Laterality: Right;   APPENDECTOMY     APPLICATION OF WOUND VAC  12/17/2020   Procedure: APPLICATION OF WOUND VAC;  Surgeon: Serafina Mitchell, MD;  Location: [place];  Service: Vascular;;   [place]; 2012;   CATARACT EXTRACTION Left 2004   COLONOSCOPY  2004   FLEISHMAN TICS   COLONOSCOPY WITH PROPOFOL N/A 11/28/2018   Procedure: COLONOSCOPY WITH PROPOFOL;  Surgeon: Rogene Houston, MD;  [facility];  Service: Endoscopy;  Laterality: N/A;   CORONARY ANGIOPLASTY WITH STENT PLACEMENT  05/18/2017   "3 stents"    CORONARY STENT INTERVENTION N/A 05/18/2017   Procedure: CORONARY STENT INTERVENTION;  Surgeon: Jettie Booze, MD;  Location: [place] [facility];  Service: Cardiovascular;  Laterality: N/A;   ESOPHAGOGASTRODUODENOSCOPY (EGD) WITH PROPOFOL N/A 11/20/2017   Procedure: ESOPHAGOGASTRODUODENOSCOPY (EGD) WITH PROPOFOL;  Surgeon: Irene Shipper, MD;  Location: [place];  Service: Gastroenterology;  Laterality: N/A;   ESOPHAGOGASTRODUODENOSCOPY (EGD) WITH PROPOFOL N/A 11/28/2018   Procedure: ESOPHAGOGASTRODUODENOSCOPY (EGD) WITH PROPOFOL;  Surgeon: Rogene Houston, MD;  [facility];  Service: Endoscopy;  Laterality: N/A;   ESOPHAGOGASTRODUODENOSCOPY (EGD) WITH PROPOFOL N/A 07/12/2019   Procedure: ESOPHAGOGASTRODUODENOSCOPY (EGD) WITH PROPOFOL;  Surgeon: Rogene Houston, MD;  [facility];  Service: Endoscopy;  Laterality: N/A;  [place] N/A 12/17/2018   Procedure: GIVENS CAPSULE STUDY;  Surgeon: Rogene Houston, MD;  [facility];  Service: Endoscopy;  Laterality: N/A;  7:30am   I & D EXTREMITY Right 12/17/2017   Procedure: IRRIGATION AND DEBRIDEMENT RIGHT GREAT TOE;  Surgeon: Serafina Mitchell, MD;  [facility];  Service: Vascular;  Laterality: Right;   INCISION AND DRAINAGE Right 12/17/2020   Procedure: INCISION AND DRAINAGE OF RIGHT FOOT;  Surgeon: Serafina Mitchell, MD;  [facility];  Service: Vascular;  Laterality: Right;   JOINT REPLACEMENT     LEFT HEART CATH AND CORONARY ANGIOGRAPHY N/A 05/18/2017   Procedure: LEFT HEART CATH AND CORONARY ANGIOGRAPHY;  Surgeon: Jettie Booze, MD;  Location: [facility];  Service: Cardiovascular;  Laterality: N/A;   LOWER EXTREMITY ANGIOGRAM Right 09/20/2017   Procedure: RIGHT LOWER LEG  ANGIOGRAM;  Surgeon: Serafina Mitchell, MD;  [facility];  Service: Vascular;  Laterality: Right;   LUMBAR FUSION  2002   L3, 4 L4, 5 L5 S1 Fused by Dr. Glenna Fellows   PERIPHERAL VASCULAR BALLOON ANGIOPLASTY  Left 09/15/2017   Procedure: [place];  Surgeon: Serafina Mitchell, MD;  Location: [place] [facility];  Service: Cardiovascular;  Laterality: Left;  PTA of Peroneal & Posterior Tibial   PERIPHERAL VASCULAR INTERVENTION Right 12/15/2020   Procedure: PERIPHERAL VASCULAR INTERVENTION;  Surgeon: Serafina Mitchell, MD;  Location: [place] [facility];  Service: Cardiovascular;  Laterality: Right;  SFA   POLYPECTOMY  11/28/2018   Procedure: POLYPECTOMY;  Surgeon: Rogene Houston, MD;  [facility];  Service: Endoscopy;;  duodenum   POSTERIOR LUMBAR FUSION     RHINOPLASTY     RIGHT HEART CATH N/A 05/18/2017   Procedure: RIGHT HEART CATH;  Surgeon: Jettie Booze, MD;  Location: [place] [facility];  Service: Cardiovascular;  Laterality: N/A;   TEE WITHOUT CARDIOVERSION N/A 09/18/2017   Procedure: TRANSESOPHAGEAL ECHOCARDIOGRAM (TEE);  Surgeon: Fay Records, MD;  [facility];  Service: Cardiovascular;  Laterality: N/A;   TOTAL KNEE ARTHROPLASTY Bilateral    TRANSMETATARSAL AMPUTATION Right 02/04/2020   Procedure: RIGHT TRANSMETATARSAL AMPUTATION;  Surgeon: Serafina Mitchell, MD;  [facility];  Service: Vascular;  Laterality: Right;   TRANSURETHRAL RESECTION OF PROSTATE  2001   [place]    Social History   Socioeconomic History   Marital status: Married    Spouse name: patsy   Number of children: Not on file   Years of education: Not on file   Highest education level: Not on file  Occupational History   Not on file  Tobacco Use   Smoking status: Former    Packs/day: 2.00    Years: 30.00    Pack years: 60.00    Types: Cigarettes    Start date: 03/28/1953    Quit date: 03/29/1983    Years since quitting: 37.9   Smokeless tobacco: Never  Vaping Use   Vaping Use: Never used  Substance and Sexual Activity   Alcohol use: Not Currently    Comment: 05/18/2017 'I drink a beer q yr"   Drug use: No   Sexual activity: Not Currently  Other Topics  Concern   Not on file  Social History Narrative   Patient lives at home with his spouse.    Social Determinants of Health   Financial Resource Strain: Not on file  Food Insecurity: No Food Insecurity   Worried About Charity fundraiser in the Last Year: Never true   Ran Out of Food in the Last Year: Never true  Transportation Needs: No Transportation Needs   Lack of Transportation (Medical): No   Lack of Transportation (Non-Medical): No  Physical Activity: Not on file  Stress: Not on file  Social Connections: Not on file  Intimate Partner Violence: Not on file    Family History  Problem Relation Age of Onset   Heart attack Father 50   COPD Father    COPD Mother    Heart disease Mother    Diabetes Mother    Hypertension Sister    CVA Sister    Diabetes Sister    Multiple sclerosis Sister     Current Outpatient Medications  Medication Sig Dispense Refill   Accu-Chek Softclix Lancets lancets      acetaminophen (TYLENOL) 500 MG tablet Take 1 tablet (500 mg total) by mouth every 6 (six) hours as needed for mild pain or headache. (Patient taking differently: Take 1,000 mg by mouth every 6 (six) hours as needed for mild pain or headache.) 30 tablet 0   Alcohol Swabs (B-D SINGLE USE SWABS REGULAR) PADS      apixaban (ELIQUIS) 2.5 MG TABS tablet Take 1 tablet (2.5 mg total) by mouth 2 (two) times daily. 60 tablet 11   aspirin EC 81 MG EC tablet Take 1 tablet (81 mg total) by mouth daily. Swallow whole. 30 tablet 11   atorvastatin (LIPITOR) 80 MG tablet Take 80 mg by mouth every evening.      Blood Glucose Monitoring Suppl (ACCU-CHEK GUIDE) w/Device KIT      Cholecalciferol (VITAMIN D3) 50 MCG (2000 UT) TABS Take 2,000 Units by mouth daily.     clopidogrel (PLAVIX) 75 MG tablet Take 1 tablet (75 mg total) by mouth daily with breakfast. 30 tablet 2   Cyanocobalamin (B-12) 2500 MCG TABS Take 2,500 mcg by mouth daily.     DROPLET INSULIN SYRINGE 31G X 5/16" 1  ML MISC      folic acid  (FOLVITE) 1 MG tablet Take 1 tablet (1 mg total) by mouth daily. 30 tablet 1   furosemide (LASIX) 80 MG tablet Take 80 mg by mouth See admin instructions. Take 80 mg by mouth in the morning and between 5-6 PM daily     insulin glargine (LANTUS) 100 UNIT/ML injection Inject 30-40 Units into the skin See admin instructions. Inject 40 units into the skin in the morning and 30 units at bedtime     ipratropium-albuterol (DUONEB) 0.5-2.5 (3) MG/3ML SOLN Take 3 mLs by nebulization every other day.     isosorbide mononitrate (IMDUR) 30 MG 24 hr tablet TAKE 1 AND 1/2 TABLETS IN THE MORNING AND AT BEDTIME (DOSE INCREASE) 270 tablet 3   lisinopril (ZESTRIL) 5 MG tablet Take 0.5 tablets (2.5 mg total) by mouth daily.     meclizine (ANTIVERT) 25 MG tablet Take 12.5 mg by mouth daily as needed for dizziness.     metFORMIN (GLUCOPHAGE) 1000 MG tablet Take 1,000 mg by mouth 2 (two) times daily.     nitroGLYCERIN (NITROSTAT) 0.4 MG SL tablet PLACE 1 TABLET (0.4 MG TOTAL) UNDER THE TONGUE EVERY 5  MINUTES AS NEEDED FOR CHEST PAIN. 25 tablet 3   ondansetron (ZOFRAN) 4 MG tablet Take 4 mg by mouth daily as needed for nausea/vomiting.     OXYGEN Inhale 2 L/min into the lungs See admin instructions. 2 L/min of oxygen at bedtime and during the day as needed for shortness of breath     pantoprazole (PROTONIX) 40 MG tablet Take 1 tablet (40 mg total) by mouth 2 (two) times daily. To protect stomach while taking multiple blood thinners. (Patient taking differently: Take 40 mg by mouth daily. To protect stomach while taking multiple blood thinners.) 60 tablet 2   potassium chloride SA (K-DUR) 20 MEQ tablet Take 1 tablet (20 mEq total) by mouth daily. 30 tablet 1   psyllium (METAMUCIL) 58.6 % packet Take 0.5 packets by mouth daily. [facility] 18 MCG inhalation capsule Place 1 capsule into inhaler and inhale daily after breakfast.      sulfamethoxazole-trimethoprim (BACTRIM DS) 800-160 MG tablet Take 1  tablet by mouth 2 (two) times daily. 42 tablet 0   traMADol (ULTRAM) 50 MG tablet Take 1 tablet (50 mg total) by mouth every 6 (six) hours as needed. 20 tablet 0   traZODone (DESYREL) 100 MG tablet Take 150 mg by mouth at bedtime.     TRUE METRIX BLOOD GLUCOSE TEST test strip      No current facility-administered medications for this visit.    Allergies  Allergen Reactions   Bactrim [Sulfamethoxazole-Trimethoprim] Other (See Comments)    Pancytopenia   Codeine Shortness Of Breath   Doxycycline Swelling    Swelling and numbness in lips and face. Swelling improved after stopping. Reports still experiences numbness in bottom lip.    Feraheme [Ferumoxytol] Other (See Comments)    Diaphoretic, chest pain   Heparin Other (See Comments)    +HIT,  Severe bleeding (with heparin drip & large doses), tolerates low doses   Losartan Swelling   Oxycodone Other (See Comments)    "Made me act out of my mind" Mental status changes- hallucinations   Latex Rash     REVIEW OF SYSTEMS:   _0  denotes positive finding, _1  denotes negative finding Cardiac  Comments:  Chest pain or chest pressure:    Shortness of  breath upon exertion:    Short of breath when lying flat:    Irregular heart rhythm:        Vascular    Pain in calf, thigh, or hip brought on by ambulation:    Pain in feet at night that wakes you up from your sleep:     Blood clot in your veins:    Leg swelling:         Pulmonary    Oxygen at home:    Productive cough:     Wheezing:         Neurologic    Sudden weakness in arms or legs:     Sudden numbness in arms or legs:     Sudden onset of difficulty speaking or slurred speech:    Temporary loss of vision in one eye:     Problems with dizziness:         Gastrointestinal    Blood in stool:     Vomited blood:         Genitourinary    Burning when urinating:     Blood in urine:        Psychiatric    Major depression:         Hematologic    Bleeding problems:     Problems with blood clotting too easily:        Skin    Rashes or ulcers:        Constitutional    Fever or chills:      PHYSICAL EXAMINATION:  Vitals:   02/15/21 1436  BP: 120/66  Pulse: 78  Resp: 20  Temp: 97.6 F (36.4 C)  TempSrc: Temporal  SpO2: 98%  Weight: 177 lb (80.3 kg)  Height: _0  (1.702 m)    General:  WDWN in NAD; vital signs documented above Gait: Not observed HENT: WNL, normocephalic Pulmonary: normal non-labored breathing Cardiac: regular HR Abdomen: soft, NT, no masses Skin: without rashes Vascular Exam/Pulses:  Right Left  Radial 2+ (normal) 2+ (normal)  PT 2+ (normal) absent   Extremities: pictured below Musculoskeletal: no muscle wasting or atrophy  Neurologic: A&O X 3;  No focal weakness or paresthesias are detected Psychiatric:  The pt has Normal affect.   Non-Invasive Vascular Imaging:        ASSESSMENT/PLAN:: 87 y.o. male here for follow up for right foot wound check  - R foot well perfused with palpable R PT pulse - Wound is decreasing in size compared to previous visit; wound bed is healthy appearing - Continue HH and wet to dry dressing changes; cleanse with soap and water daily - Follow up in 3 weeks for wound check   Dagoberto Ligas, PA-C [facility] [phone]  Clinic MD:   Virl Cagey

## 2021-02-17 ENCOUNTER — Other Ambulatory Visit: Payer: Self-pay

## 2021-02-17 DIAGNOSIS — I110 Hypertensive heart disease with heart failure: Secondary | ICD-10-CM | POA: Diagnosis not present

## 2021-02-17 DIAGNOSIS — D649 Anemia, unspecified: Secondary | ICD-10-CM | POA: Diagnosis not present

## 2021-02-17 DIAGNOSIS — I480 Paroxysmal atrial fibrillation: Secondary | ICD-10-CM | POA: Diagnosis not present

## 2021-02-17 DIAGNOSIS — J449 Chronic obstructive pulmonary disease, unspecified: Secondary | ICD-10-CM | POA: Diagnosis not present

## 2021-02-17 DIAGNOSIS — M47812 Spondylosis without myelopathy or radiculopathy, cervical region: Secondary | ICD-10-CM | POA: Diagnosis not present

## 2021-02-17 DIAGNOSIS — I5032 Chronic diastolic (congestive) heart failure: Secondary | ICD-10-CM | POA: Diagnosis not present

## 2021-02-17 DIAGNOSIS — E1151 Type 2 diabetes mellitus with diabetic peripheral angiopathy without gangrene: Secondary | ICD-10-CM | POA: Diagnosis not present

## 2021-02-17 DIAGNOSIS — I2510 Atherosclerotic heart disease of native coronary artery without angina pectoris: Secondary | ICD-10-CM | POA: Diagnosis not present

## 2021-02-17 DIAGNOSIS — T8189XA Other complications of procedures, not elsewhere classified, initial encounter: Secondary | ICD-10-CM | POA: Diagnosis not present

## 2021-02-17 NOTE — Patient Outreach (Signed)
Mike Wright) Care Management  [place]  02/17/2021   Mike Wright [birth year] [id number]  Subjective: Telephone call to patient for follow up. Patient reports he is doing good.  Right foot wound still healing but smaller per patient.  Home health continues 3/week and vascular doctor visits continue.  Heart failure management continues.  Patient voices no concerns.    Objective:   Encounter Medications:  Outpatient Encounter Medications as of 02/17/2021  Medication Sig   Accu-Chek Softclix Lancets lancets    acetaminophen (TYLENOL) 500 MG tablet Take 1 tablet (500 mg total) by mouth every 6 (six) hours as needed for mild pain or headache. (Patient taking differently: Take 1,000 mg by mouth every 6 (six) hours as needed for mild pain or headache.)   Alcohol Swabs (B-D SINGLE USE SWABS REGULAR) PADS    apixaban (ELIQUIS) 2.5 MG TABS tablet Take 1 tablet (2.5 mg total) by mouth 2 (two) times daily.   aspirin EC 81 MG EC tablet Take 1 tablet (81 mg total) by mouth daily. Swallow whole.   atorvastatin (LIPITOR) 80 MG tablet Take 80 mg by mouth every evening.    Blood Glucose Monitoring Suppl (ACCU-CHEK GUIDE) w/Device KIT    Cholecalciferol (VITAMIN D3) 50 MCG (2000 UT) TABS Take 2,000 Units by mouth daily.   clopidogrel (PLAVIX) 75 MG tablet Take 1 tablet (75 mg total) by mouth daily with breakfast.   Cyanocobalamin (B-12) 2500 MCG TABS Take 2,500 mcg by mouth daily.   DROPLET INSULIN SYRINGE 31G X 5/16" 1 ML MISC    folic acid (FOLVITE) 1 MG tablet Take 1 tablet (1 mg total) by mouth daily.   furosemide (LASIX) 80 MG tablet Take 80 mg by mouth See admin instructions. Take 80 mg by mouth in the morning and between 5-6 PM daily   insulin glargine (LANTUS) 100 UNIT/ML injection Inject 30-40 Units into the skin See admin instructions. Inject 40 units into the skin in the morning and 30 units at bedtime   ipratropium-albuterol (DUONEB) 0.5-2.5 (3) MG/3ML SOLN Take 3  mLs by nebulization every other day.   isosorbide mononitrate (IMDUR) 30 MG 24 hr tablet TAKE 1 AND 1/2 TABLETS IN THE MORNING AND AT BEDTIME (DOSE INCREASE)   lisinopril (ZESTRIL) 5 MG tablet Take 0.5 tablets (2.5 mg total) by mouth daily.   meclizine (ANTIVERT) 25 MG tablet Take 12.5 mg by mouth daily as needed for dizziness.   metFORMIN (GLUCOPHAGE) 1000 MG tablet Take 1,000 mg by mouth 2 (two) times daily.   nitroGLYCERIN (NITROSTAT) 0.4 MG SL tablet PLACE 1 TABLET (0.4 MG TOTAL) UNDER THE TONGUE EVERY 5  MINUTES AS NEEDED FOR CHEST PAIN.   ondansetron (ZOFRAN) 4 MG tablet Take 4 mg by mouth daily as needed for nausea/vomiting.   OXYGEN Inhale 2 L/min into the lungs See admin instructions. 2 L/min of oxygen at bedtime and during the day as needed for shortness of breath   pantoprazole (PROTONIX) 40 MG tablet Take 1 tablet (40 mg total) by mouth 2 (two) times daily. To protect stomach while taking multiple blood thinners. (Patient taking differently: Take 40 mg by mouth daily. To protect stomach while taking multiple blood thinners.)   potassium chloride SA (K-DUR) 20 MEQ tablet Take 1 tablet (20 mEq total) by mouth daily.   psyllium (METAMUCIL) 58.6 % packet Take 0.5 packets by mouth daily. MIX AND DRINK   SPIRIVA HANDIHALER 18 MCG inhalation capsule Place 1 capsule into inhaler and inhale daily after  breakfast.    sulfamethoxazole-trimethoprim (BACTRIM DS) 800-160 MG tablet Take 1 tablet by mouth 2 (two) times daily.   traMADol (ULTRAM) 50 MG tablet Take 1 tablet (50 mg total) by mouth every 6 (six) hours as needed.   traZODone (DESYREL) 100 MG tablet Take 150 mg by mouth at bedtime.   TRUE METRIX BLOOD GLUCOSE TEST test strip    No facility-administered encounter medications on file as of 02/17/2021.    Functional Status:  In your present state of health, do you have any difficulty performing the following activities: 12/21/2020 12/15/2020  Hearing? N N  Vision? N N  Difficulty  concentrating or making decisions? N N  Walking or climbing stairs? Y N  Comment needs assist of one person for stability -  Dressing or bathing? N N  Doing errands, shopping? Y N  Comment wife assists -  Conservation officer, nature and eating ? N -  Using the Toilet? N -  In the past six months, have you accidently leaked urine? N -  Comment - -  Do you have problems with loss of bowel control? N -  Managing your Medications? - -  Managing your Finances? N -  Housekeeping or managing your Housekeeping? Y -  Comment wife assists -  Some recent data might be hidden    Fall/Depression Screening: Fall Risk  12/21/2020 10/08/2020 03/10/2020  Falls in the past year? 0 1 1  Comment - - -  Number falls in past yr: - 1 1  Injury with Fall? - 0 0  Risk for fall due to : - Orthopedic patient;Impaired balance/gait Orthopedic patient  Follow up - Falls prevention discussed Falls prevention discussed   PHQ 2/9 Scores 12/21/2020 10/08/2020 02/10/2020 11/07/2019 07/29/2019 06/12/2019 12/20/2018  PHQ - 2 Score 0 0 0 0 0 0 0    Assessment:   Care Plan  Patient Care Plan: Heart Failure (Adult)  Completed 02/17/2021   Problem Identified: Symptom Exacerbation (Heart Failure) Resolved 02/17/2021     Long-Range Goal: Symptom Exacerbation Prevented or Minimized as evidenced by patient continuing to monitor weights regularly. Completed 02/17/2021  Start Date: 02/10/2020  Expected End Date: 09/24/2021  Recent Progress: On track  Priority: High  Note:   Evidence-based guidance: Establish a mutually-agreed-upon early intervention process to communicate with primary care provider when signs/symptoms worsen.  Notes: 06/29/20   Patient weighs daily and is aware of worsening symptoms.   10/08/20 Patient continues to weight daily. Weight 184 lbs and trending upward.  Discussed continuing to monitor weights and limit salt intake due to trending upward.   11/17/20 Patient weighing daily. Today's weight 179 lbs. Reiterated  heart failure management.    97/35/32: Resolving duplicate goal.     Task: Identify and Minimize Risk of Heart Failure Exacerbation Completed 02/17/2021  Due Date: 09/24/2021  Priority: Routine  Responsible User: Jon Billings, RN  Note:   Care Management Activities:    - rescue (action) plan reviewed - self-awareness of signs/symptoms of worsening disease encouraged    Notes: 05-01-20 Patient knows signs of heart failure and when to notify physician.  06/29/20 Reiterated signs of heart failure and importance of weights.   10/08/20 Patient continues to weight daily. Weight 184 lbs and trending upward.  Discussed continuing to monitor weights and limit salt intake due to trending upward.   Heart Failure Management Discussed Please weight daily or as ordered by your doctor Report to your doctor weight gain of 2 -3 pounds in a day or 5  pounds in a week Limit salt intake Monitor for shortness of breath, swelling of feet, ankles or abdomen and weight gain. Never use the saltshaker.  Read all food labels and avoid canned, processed, and pickled foods.   Follow your doctor's recommendations for daily salt intake. 11/17/20 Patient weighing daily. Today's weight 179 lbs. Reiterated heart failure management.   12/21/20 Daily weights continue.  Reiterated heart failure management.  Weight 172lbs.    01/04/21 Patient reports he is doing well. Weight staying around 177 lbs or less. Reiterated heart failure management.  No concerns. 01/20/21 Patient weight 175 lbs.  Patient continues to follow a low salt diet. Reviewed heart failure management. No concerns.      Patient Care Plan: RN Care Manager Plan of Care     Problem Identified: Chronic Disease Management and Care Coordination Needs for heart failure   Priority: High     Goal: Healing of chronic wound to right foot as evidenced by patient reported wound improving.   Start Date: 02/17/2021  Expected End Date: 06/25/2021  Priority: High  Note:    Current Barriers:  Chronic Disease Management support and education needs related to Chronic right foot wound  RNCM Clinical Goal(s):  Patient will verbalize understanding of plan for management of chronic right foot wound  through collaboration with RN Care manager, provider, and care team.   Interventions: Education and support provided in wound care and prevention of infection Inter-disciplinary care team collaboration (see longitudinal plan of care) Evaluation of current treatment plan related to  self management and patient's adherence to plan as established by provider  Chronic Right foot woundNew goal. Evaluation of current treatment plan related to  Chronic right foot wound ,  self-management and patient's adherence to plan as established by provider. Discussed plans with patient for ongoing care management follow up and provided patient with direct contact information for care management team Evaluation of current treatment plan related to chronic right foot wound and patient's adherence to plan as established by provider; Wound Management Discussed       Home Health visits 3/week Monitor for signs of infection: swelling, redness, pain, pus, and fever Keep wound clean and dry.    Patient Goals/Self-Care Activities: Patient will attend all scheduled provider appointments Patient will call provider office for new concerns or questions Patient will monitor for signs of infection such as swelling, redness, pain, pus, and fever Patient will keep wound and dressing clean and dry.   Follow Up Plan:  Telephone follow up appointment with care management team member scheduled for:  December The patient has been provided with contact information for the care management team and has been advised to call with any health related questions or concerns.      Long-Range Goal: Development of plan of care for management of heart failure   Start Date: 02/17/2021  Expected End Date: 03/19/2022   Priority: High  Note:   Current Barriers:  Chronic Disease Management support and education needs related to CHF  RNCM Clinical Goal(s):  Patient will verbalize basic understanding of CHF disease process and self health management plan as evidenced by no acute exacerbations of CHF continue to work with RN Care Manager and/or Social Worker to address care management and care coordination needs related to CHF as evidenced by adherence to CM Team Scheduled appointments     through collaboration with Consulting civil engineer, provider, and care team.   Interventions: Education and support provided for heart failure management Inter-disciplinary care team  collaboration (see longitudinal plan of care) Evaluation of current treatment plan related to  self management and patient's adherence to plan as established by provider    Patient Goals/Self-Care Activities: Take medications as prescribed   Attend all scheduled provider appointments Call provider office for new concerns or questions  call office if I gain more than 2 pounds in one day or 5 pounds in one week use salt in moderation watch for swelling in feet, ankles and legs every day weigh myself daily follow rescue plan if symptoms flare-up eat more whole grains, fruits and vegetables, lean meats and healthy fats          Goals Addressed   None     Plan:  Follow-up: Patient agrees to Care Plan and Follow-up. Follow-up in 1 month(s)  [name], RN, MSN Post Management Care Management Coordinator Direct Line [phone] Cell [phone] Toll Free: [phone]  Fax: [phone]

## 2021-02-17 NOTE — Patient Instructions (Addendum)
Patient Goals/Self-Care Activities: right foot wound Patient will attend all scheduled provider appointments Patient will call provider office for new concerns or questions Patient will monitor for signs of infection such as swelling, redness, pain, pus, and fever Patient will keep wound and dressing clean and dry.    Patient Goals/Self-Care Activities: Heart Failure Take medications as prescribed   Attend all scheduled provider appointments Call provider office for new concerns or questions  call office if I gain more than 2 pounds in one day or 5 pounds in one week use salt in moderation watch for swelling in feet, ankles and legs every day weigh myself daily follow rescue plan if symptoms flare-up eat more whole grains, fruits and vegetables, lean meats and healthy fats

## 2021-02-19 DIAGNOSIS — M47812 Spondylosis without myelopathy or radiculopathy, cervical region: Secondary | ICD-10-CM | POA: Diagnosis not present

## 2021-02-19 DIAGNOSIS — I480 Paroxysmal atrial fibrillation: Secondary | ICD-10-CM | POA: Diagnosis not present

## 2021-02-19 DIAGNOSIS — T8189XA Other complications of procedures, not elsewhere classified, initial encounter: Secondary | ICD-10-CM | POA: Diagnosis not present

## 2021-02-19 DIAGNOSIS — I2510 Atherosclerotic heart disease of native coronary artery without angina pectoris: Secondary | ICD-10-CM | POA: Diagnosis not present

## 2021-02-19 DIAGNOSIS — I110 Hypertensive heart disease with heart failure: Secondary | ICD-10-CM | POA: Diagnosis not present

## 2021-02-19 DIAGNOSIS — D649 Anemia, unspecified: Secondary | ICD-10-CM | POA: Diagnosis not present

## 2021-02-19 DIAGNOSIS — I5032 Chronic diastolic (congestive) heart failure: Secondary | ICD-10-CM | POA: Diagnosis not present

## 2021-02-19 DIAGNOSIS — E1151 Type 2 diabetes mellitus with diabetic peripheral angiopathy without gangrene: Secondary | ICD-10-CM | POA: Diagnosis not present

## 2021-02-19 DIAGNOSIS — J449 Chronic obstructive pulmonary disease, unspecified: Secondary | ICD-10-CM | POA: Diagnosis not present

## 2021-02-21 DIAGNOSIS — I509 Heart failure, unspecified: Secondary | ICD-10-CM | POA: Diagnosis not present

## 2021-02-24 DIAGNOSIS — D649 Anemia, unspecified: Secondary | ICD-10-CM | POA: Diagnosis not present

## 2021-02-24 DIAGNOSIS — I5032 Chronic diastolic (congestive) heart failure: Secondary | ICD-10-CM | POA: Diagnosis not present

## 2021-02-24 DIAGNOSIS — J449 Chronic obstructive pulmonary disease, unspecified: Secondary | ICD-10-CM | POA: Diagnosis not present

## 2021-02-24 DIAGNOSIS — I10 Essential (primary) hypertension: Secondary | ICD-10-CM | POA: Diagnosis not present

## 2021-02-24 DIAGNOSIS — M47812 Spondylosis without myelopathy or radiculopathy, cervical region: Secondary | ICD-10-CM | POA: Diagnosis not present

## 2021-02-24 DIAGNOSIS — I2510 Atherosclerotic heart disease of native coronary artery without angina pectoris: Secondary | ICD-10-CM | POA: Diagnosis not present

## 2021-02-24 DIAGNOSIS — E119 Type 2 diabetes mellitus without complications: Secondary | ICD-10-CM | POA: Diagnosis not present

## 2021-02-24 DIAGNOSIS — T8189XD Other complications of procedures, not elsewhere classified, subsequent encounter: Secondary | ICD-10-CM | POA: Diagnosis not present

## 2021-02-24 DIAGNOSIS — E1151 Type 2 diabetes mellitus with diabetic peripheral angiopathy without gangrene: Secondary | ICD-10-CM | POA: Diagnosis not present

## 2021-02-24 DIAGNOSIS — I480 Paroxysmal atrial fibrillation: Secondary | ICD-10-CM | POA: Diagnosis not present

## 2021-02-24 DIAGNOSIS — I110 Hypertensive heart disease with heart failure: Secondary | ICD-10-CM | POA: Diagnosis not present

## 2021-03-03 DIAGNOSIS — E1151 Type 2 diabetes mellitus with diabetic peripheral angiopathy without gangrene: Secondary | ICD-10-CM | POA: Diagnosis not present

## 2021-03-03 DIAGNOSIS — T8189XD Other complications of procedures, not elsewhere classified, subsequent encounter: Secondary | ICD-10-CM | POA: Diagnosis not present

## 2021-03-03 DIAGNOSIS — M47812 Spondylosis without myelopathy or radiculopathy, cervical region: Secondary | ICD-10-CM | POA: Diagnosis not present

## 2021-03-03 DIAGNOSIS — D649 Anemia, unspecified: Secondary | ICD-10-CM | POA: Diagnosis not present

## 2021-03-03 DIAGNOSIS — I2510 Atherosclerotic heart disease of native coronary artery without angina pectoris: Secondary | ICD-10-CM | POA: Diagnosis not present

## 2021-03-03 DIAGNOSIS — I5032 Chronic diastolic (congestive) heart failure: Secondary | ICD-10-CM | POA: Diagnosis not present

## 2021-03-03 DIAGNOSIS — I110 Hypertensive heart disease with heart failure: Secondary | ICD-10-CM | POA: Diagnosis not present

## 2021-03-03 DIAGNOSIS — I480 Paroxysmal atrial fibrillation: Secondary | ICD-10-CM | POA: Diagnosis not present

## 2021-03-03 DIAGNOSIS — J449 Chronic obstructive pulmonary disease, unspecified: Secondary | ICD-10-CM | POA: Diagnosis not present

## 2021-03-05 ENCOUNTER — Other Ambulatory Visit: Payer: Self-pay | Admitting: Cardiology

## 2021-03-05 NOTE — Telephone Encounter (Signed)
Eliquis 2.5mg  refill request received. Patient is 87 years old, weight-80.3kg, Crea-1.58 on 12/19/20, Diagnosis-Afib, and last seen by Dr. [name] on 08/17/2020. Dose is appropriate based on dosing criteria. Will send in refill to requested pharmacy.

## 2021-03-08 ENCOUNTER — Other Ambulatory Visit: Payer: Self-pay

## 2021-03-08 ENCOUNTER — Encounter: Payer: Self-pay | Admitting: Physician Assistant

## 2021-03-08 ENCOUNTER — Ambulatory Visit (INDEPENDENT_AMBULATORY_CARE_PROVIDER_SITE_OTHER): Payer: Medicare HMO | Admitting: Physician Assistant

## 2021-03-08 VITALS — BP 123/62 | HR 62 | Temp 98.0°F | Resp 20 | Ht 67.0 in | Wt 177.0 lb

## 2021-03-08 DIAGNOSIS — T8189XD Other complications of procedures, not elsewhere classified, subsequent encounter: Secondary | ICD-10-CM

## 2021-03-08 NOTE — Progress Notes (Signed)
POST OPERATIVE OFFICE NOTE    CC:  F/u for surgery  HPI:  This is a 87 y.o. male who presents for wound follow up. 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  Prior to surgery he had a diagnostic arteriogram which demonstrated diffuse tibial disease however but no treatable flow-limiting stenosis at the time.  The patient and his wife state they had a blister appearing on the dorsal foot in the past few weeks which has slowly worsened.   He underwent I&D of right foot including skin, soft tissue, muscle, and tendon and placement of wound vac on 12/17/2020 by Dr. Trula Slade. He was discharged home with wound VAC on right foot.    He was last seen 02/15/2021 and his wound vac had been discontinued and they felt the wound was improving.  The wound bed was decreased in size from previous visit and wound bed was healthy appearing.   He was to continue his Eliquis and asa daily.  He was instructed to clean with soap and water daily and continue wet to dry dressing changes and f/u in 3 weeks for wound check.   Pt returns today for follow up.  Pt states he has been doing well and feels his wound is healing.  HH is still coming out to the house once a week.  His wife continues daily dressing changes.   Allergies  Allergen Reactions   Bactrim [Sulfamethoxazole-Trimethoprim] Other (See Comments)    Pancytopenia   Codeine Shortness Of Breath   Doxycycline Swelling    Swelling and numbness in lips and face. Swelling improved after stopping. Reports still experiences numbness in bottom lip.    Feraheme [Ferumoxytol] Other (See Comments)    Diaphoretic, chest pain   Heparin Other (See Comments)    +HIT,  Severe bleeding (with heparin drip & large doses), tolerates low doses   Losartan Swelling   Oxycodone Other (See Comments)    "Made me act out of my mind" Mental status changes- hallucinations   Latex Rash    Current Outpatient Medications  Medication Sig  Dispense Refill   Accu-Chek Softclix Lancets lancets      acetaminophen (TYLENOL) 500 MG tablet Take 1 tablet (500 mg total) by mouth every 6 (six) hours as needed for mild pain or headache. (Patient taking differently: Take 1,000 mg by mouth every 6 (six) hours as needed for mild pain or headache.) 30 tablet 0   Alcohol Swabs (B-D SINGLE USE SWABS REGULAR) PADS      apixaban (ELIQUIS) 2.5 MG TABS tablet Take 1 tablet (2.5 mg total) by mouth 2 (two) times daily. 60 tablet 11   apixaban (ELIQUIS) 2.5 MG TABS tablet Take 1 tablet (2.5 mg total) by mouth 2 (two) times daily. 180 tablet 1   aspirin EC 81 MG EC tablet Take 1 tablet (81 mg total) by mouth daily. Swallow whole. 30 tablet 11   atorvastatin (LIPITOR) 80 MG tablet Take 80 mg by mouth every evening.      Blood Glucose Monitoring Suppl (ACCU-CHEK GUIDE) w/Device KIT      Cholecalciferol (VITAMIN D3) 50 MCG (2000 UT) TABS Take 2,000 Units by mouth daily.     clopidogrel (PLAVIX) 75 MG tablet Take 1 tablet (75 mg total) by mouth daily with breakfast. 30 tablet 2   Cyanocobalamin (B-12) 2500 MCG TABS Take 2,500 mcg by mouth daily.     DROPLET INSULIN SYRINGE 31G X 5/16" 1 ML MISC  folic acid (FOLVITE) 1 MG tablet Take 1 tablet (1 mg total) by mouth daily. 30 tablet 1   furosemide (LASIX) 80 MG tablet Take 80 mg by mouth See admin instructions. Take 80 mg by mouth in the morning and between 5-6 PM daily     insulin glargine (LANTUS) 100 UNIT/ML injection Inject 30-40 Units into the skin See admin instructions. Inject 40 units into the skin in the morning and 30 units at bedtime     ipratropium-albuterol (DUONEB) 0.5-2.5 (3) MG/3ML SOLN Take 3 mLs by nebulization every other day.     isosorbide mononitrate (IMDUR) 30 MG 24 hr tablet TAKE 1 AND 1/2 TABLETS IN THE MORNING AND AT BEDTIME (DOSE INCREASE) 270 tablet 3   lisinopril (ZESTRIL) 5 MG tablet Take 0.5 tablets (2.5 mg total) by mouth daily.     meclizine (ANTIVERT) 25 MG tablet Take 12.5  mg by mouth daily as needed for dizziness.     metFORMIN (GLUCOPHAGE) 1000 MG tablet Take 1,000 mg by mouth 2 (two) times daily.     nitroGLYCERIN (NITROSTAT) 0.4 MG SL tablet PLACE 1 TABLET (0.4 MG TOTAL) UNDER THE TONGUE EVERY 5  MINUTES AS NEEDED FOR CHEST PAIN. 25 tablet 3   ondansetron (ZOFRAN) 4 MG tablet Take 4 mg by mouth daily as needed for nausea/vomiting.     OXYGEN Inhale 2 L/min into the lungs See admin instructions. 2 L/min of oxygen at bedtime and during the day as needed for shortness of breath     pantoprazole (PROTONIX) 40 MG tablet Take 1 tablet (40 mg total) by mouth 2 (two) times daily. To protect stomach while taking multiple blood thinners. (Patient taking differently: Take 40 mg by mouth daily. To protect stomach while taking multiple blood thinners.) 60 tablet 2   potassium chloride SA (K-DUR) 20 MEQ tablet Take 1 tablet (20 mEq total) by mouth daily. 30 tablet 1   psyllium (METAMUCIL) 58.6 % packet Take 0.5 packets by mouth daily. [place] 18 MCG inhalation capsule Place 1 capsule into inhaler and inhale daily after breakfast.      sulfamethoxazole-trimethoprim (BACTRIM DS) 800-160 MG tablet Take 1 tablet by mouth 2 (two) times daily. 42 tablet 0   traMADol (ULTRAM) 50 MG tablet Take 1 tablet (50 mg total) by mouth every 6 (six) hours as needed. 20 tablet 0   traZODone (DESYREL) 100 MG tablet Take 150 mg by mouth at bedtime.     TRUE METRIX BLOOD GLUCOSE TEST test strip      No current facility-administered medications for this visit.     ROS:  See HPI  Physical Exam:  Today's Vitals   03/08/21 1414  BP: 123/62  Pulse: 62  Resp: 20  Temp: 98 F (36.7 C)  TempSrc: Temporal  SpO2: 99%  Weight: 177 lb (80.3 kg)  Height: _0  (1.702 m)  PainSc: 0-No pain   Body mass index is 27.72 kg/m.   Extremities:  brisk doppler signals right DP/PT      Assessment/Plan:  This is a 87 y.o. male who is s/p: right TMA by Dr. Trula Slade in  November 2021 and then developed a blister and subsequently underwent I&D of right foot including skin, soft tissue, muscle, and tendon and placement of wound vac on 12/17/2020 by Dr. Trula Slade. He was discharged home with wound VAC on right foot.    He presents today for wound check.  -his wound continues to improve.  Dead  skin and excess tissue was debrided today.   -he will continue his wet to dry dressing changes daily. -he will f/u in 4 weeks for wound check.  Anticipate this will be close to healed by that visit. They will call sooner if they have any issues.    Leontine Locket, [facility] [phone]   Clinic MD:  Trula Slade

## 2021-03-10 DIAGNOSIS — I2510 Atherosclerotic heart disease of native coronary artery without angina pectoris: Secondary | ICD-10-CM | POA: Diagnosis not present

## 2021-03-10 DIAGNOSIS — I110 Hypertensive heart disease with heart failure: Secondary | ICD-10-CM | POA: Diagnosis not present

## 2021-03-10 DIAGNOSIS — J449 Chronic obstructive pulmonary disease, unspecified: Secondary | ICD-10-CM | POA: Diagnosis not present

## 2021-03-10 DIAGNOSIS — E1151 Type 2 diabetes mellitus with diabetic peripheral angiopathy without gangrene: Secondary | ICD-10-CM | POA: Diagnosis not present

## 2021-03-10 DIAGNOSIS — M47812 Spondylosis without myelopathy or radiculopathy, cervical region: Secondary | ICD-10-CM | POA: Diagnosis not present

## 2021-03-10 DIAGNOSIS — T8189XD Other complications of procedures, not elsewhere classified, subsequent encounter: Secondary | ICD-10-CM | POA: Diagnosis not present

## 2021-03-10 DIAGNOSIS — D649 Anemia, unspecified: Secondary | ICD-10-CM | POA: Diagnosis not present

## 2021-03-10 DIAGNOSIS — I480 Paroxysmal atrial fibrillation: Secondary | ICD-10-CM | POA: Diagnosis not present

## 2021-03-10 DIAGNOSIS — I5032 Chronic diastolic (congestive) heart failure: Secondary | ICD-10-CM | POA: Diagnosis not present

## 2021-03-15 ENCOUNTER — Other Ambulatory Visit: Payer: Self-pay

## 2021-03-15 NOTE — Patient Outreach (Signed)
[facility]) Care Management  [place]  03/15/2021   Mike Wright 1933/04/17 [id number]  Subjective: Telephone call to patient for follow up. Patient reports doing well.  He states right foot about healed now.  Weight about the same.  Heart Failure management continues.   Objective:   Encounter Medications:  Outpatient Encounter Medications as of 03/15/2021  Medication Sig   Accu-Chek Softclix Lancets lancets    acetaminophen (TYLENOL) 500 MG tablet Take 1 tablet (500 mg total) by mouth every 6 (six) hours as needed for mild pain or headache. (Patient taking differently: Take 1,000 mg by mouth every 6 (six) hours as needed for mild pain or headache.)   Alcohol Swabs (B-D SINGLE USE SWABS REGULAR) PADS    apixaban (ELIQUIS) 2.5 MG TABS tablet Take 1 tablet (2.5 mg total) by mouth 2 (two) times daily.   apixaban (ELIQUIS) 2.5 MG TABS tablet Take 1 tablet (2.5 mg total) by mouth 2 (two) times daily.   aspirin EC 81 MG EC tablet Take 1 tablet (81 mg total) by mouth daily. Swallow whole.   atorvastatin (LIPITOR) 80 MG tablet Take 80 mg by mouth every evening.    Blood Glucose Monitoring Suppl (ACCU-CHEK GUIDE) w/Device KIT    Cholecalciferol (VITAMIN D3) 50 MCG (2000 UT) TABS Take 2,000 Units by mouth daily.   clopidogrel (PLAVIX) 75 MG tablet Take 1 tablet (75 mg total) by mouth daily with breakfast.   Cyanocobalamin (B-12) 2500 MCG TABS Take 2,500 mcg by mouth daily.   DROPLET INSULIN SYRINGE 31G X 5/16" 1 ML MISC    folic acid (FOLVITE) 1 MG tablet Take 1 tablet (1 mg total) by mouth daily.   furosemide (LASIX) 80 MG tablet Take 80 mg by mouth See admin instructions. Take 80 mg by mouth in the morning and between 5-6 PM daily   insulin glargine (LANTUS) 100 UNIT/ML injection Inject 30-40 Units into the skin See admin instructions. Inject 40 units into the skin in the morning and 30 units at bedtime   ipratropium-albuterol (DUONEB) 0.5-2.5 (3) MG/3ML SOLN  Take 3 mLs by nebulization every other day.   isosorbide mononitrate (IMDUR) 30 MG 24 hr tablet TAKE 1 AND 1/2 TABLETS IN THE MORNING AND AT BEDTIME (DOSE INCREASE)   lisinopril (ZESTRIL) 5 MG tablet Take 0.5 tablets (2.5 mg total) by mouth daily.   meclizine (ANTIVERT) 25 MG tablet Take 12.5 mg by mouth daily as needed for dizziness.   metFORMIN (GLUCOPHAGE) 1000 MG tablet Take 1,000 mg by mouth 2 (two) times daily.   nitroGLYCERIN (NITROSTAT) 0.4 MG SL tablet PLACE 1 TABLET (0.4 MG TOTAL) UNDER THE TONGUE EVERY 5  MINUTES AS NEEDED FOR CHEST PAIN.   ondansetron (ZOFRAN) 4 MG tablet Take 4 mg by mouth daily as needed for nausea/vomiting.   OXYGEN Inhale 2 L/min into the lungs See admin instructions. 2 L/min of oxygen at bedtime and during the day as needed for shortness of breath   pantoprazole (PROTONIX) 40 MG tablet Take 1 tablet (40 mg total) by mouth 2 (two) times daily. To protect stomach while taking multiple blood thinners. (Patient taking differently: Take 40 mg by mouth daily. To protect stomach while taking multiple blood thinners.)   potassium chloride SA (K-DUR) 20 MEQ tablet Take 1 tablet (20 mEq total) by mouth daily.   psyllium (METAMUCIL) 58.6 % packet Take 0.5 packets by mouth daily. MIX AND DRINK   SPIRIVA HANDIHALER 18 MCG inhalation capsule Place 1 capsule into  inhaler and inhale daily after breakfast.    sulfamethoxazole-trimethoprim (BACTRIM DS) 800-160 MG tablet Take 1 tablet by mouth 2 (two) times daily.   traMADol (ULTRAM) 50 MG tablet Take 1 tablet (50 mg total) by mouth every 6 (six) hours as needed.   traZODone (DESYREL) 100 MG tablet Take 150 mg by mouth at bedtime.   TRUE METRIX BLOOD GLUCOSE TEST test strip    No facility-administered encounter medications on file as of 03/15/2021.    Functional Status:  In your present state of health, do you have any difficulty performing the following activities: 12/21/2020 12/15/2020  Hearing? N N  Vision? N N  Difficulty  concentrating or making decisions? N N  Walking or climbing stairs? Y N  Comment needs assist of one person for stability -  Dressing or bathing? N N  Doing errands, shopping? Y N  Comment wife assists -  Conservation officer, nature and eating ? N -  Using the Toilet? N -  In the past six months, have you accidently leaked urine? N -  Comment - -  Do you have problems with loss of bowel control? N -  Managing your Medications? - -  Managing your Finances? N -  Housekeeping or managing your Housekeeping? Y -  Comment wife assists -  Some recent data might be hidden    Fall/Depression Screening: Fall Risk  12/21/2020 10/08/2020 03/10/2020  Falls in the past year? 0 1 1  Comment - - -  Number falls in past yr: - 1 1  Injury with Fall? - 0 0  Risk for fall due to : - Orthopedic patient;Impaired balance/gait Orthopedic patient  Follow up - Falls prevention discussed Falls prevention discussed   PHQ 2/9 Scores 12/21/2020 10/08/2020 02/10/2020 11/07/2019 07/29/2019 06/12/2019 12/20/2018  PHQ - 2 Score 0 0 0 0 0 0 0    Assessment:   Care Plan Care Plan : [place] of Care  Updates made by Jon Billings, RN since 03/15/2021 12:00 AM     Problem: Chronic Disease Management and Care Coordination Needs for heart failure   Priority: High     Goal: Healing of chronic wound to right foot as evidenced by patient reported wound improving.   Start Date: 02/17/2021  Expected End Date: 06/25/2021  This Visit's Progress: On track  Priority: High  Note:   Current Barriers:  Chronic Disease Management support and education needs related to Chronic right foot wound  RNCM Clinical Goal(s):  Patient will verbalize understanding of plan for management of chronic right foot wound  through collaboration with RN Care manager, provider, and care team.   Interventions: Education and support provided in wound care and prevention of infection Inter-disciplinary care team collaboration (see longitudinal  plan of care) Evaluation of current treatment plan related to  self management and patient's adherence to plan as established by provider  Chronic Right foot woundGoal on track:  Yes. Evaluation of current treatment plan related to  Chronic right foot wound ,  self-management and patient's adherence to plan as established by provider. Discussed plans with patient for ongoing care management follow up and provided patient with direct contact information for care management team Evaluation of current treatment plan related to chronic right foot wound and patient's adherence to plan as established by provider; Wound Management Discussed       Home Health visits 3/week Monitor for signs of infection: swelling, redness, pain, pus, and fever Keep wound clean and dry.   03/15/21  [name] reports doing well.  Right foot wound just about healed.  Pictures in [place].  Discussed wound care and signs of infection.    Patient Goals/Self-Care Activities: Patient will attend all scheduled provider appointments Patient will call provider office for new concerns or questions Patient will monitor for signs of infection such as swelling, redness, pain, pus, and fever Patient will keep wound and dressing clean and dry.   Follow Up Plan:  Telephone follow up appointment with care management team member scheduled for:  December The patient has been provided with contact information for the care management team and has been advised to call with any health related questions or concerns.      Long-Range Goal: Development of plan of care for management of heart failure   Start Date: 02/17/2021  Expected End Date: 03/19/2022  This Visit's Progress: On track  Priority: High  Note:   Current Barriers:  Chronic Disease Management support and education needs related to CHF   RNCM Clinical Goal(s):  Patient will verbalize basic understanding of  CHF disease process and self health management plan as evidenced by No  exacerbation of heart failure continue to work with RN Care Manager to address care management and care coordination needs related to  CHF as evidenced by adherence to CM Team Scheduled appointments through collaboration with RN Care manager, provider, and care team.   Interventions: Education and support related to heart failure management. Inter-disciplinary care team collaboration (see longitudinal plan of care) Evaluation of current treatment plan related to  self management and patient's adherence to plan as established by provider   Heart Failure Interventions:  (Status:  Goal on track:  Yes.) Long Term Goal Provided education on low sodium diet Discussed importance of daily weight and advised patient to weigh and record daily Discussed the importance of keeping all appointments with provider 03/15/21 Patient reports continuing to weigh daily.  Current weight around 176 lbs.  Discussed heart failure management.   Patient Goals/Self-Care Activities: Heart Failure Take all medications as prescribed Attend all scheduled provider appointments call office if I gain more than 2 pounds in one day or 5 pounds in one week track weight in diary use salt in moderation watch for swelling in feet, ankles and legs every day weigh myself daily follow rescue plan if symptoms flare-up eat more whole grains, fruits and vegetables, lean meats and healthy fats  Follow Up Plan:  Telephone follow up appointment with care management team member scheduled for:  January The patient has been provided with contact information for the care management team and has been advised to call with any health related questions or concerns.       Goals Addressed               This Visit's Progress     COMPLETED: THN-Monitor foot wound for infection (pt-stated)        Barriers: Knowledge Timeframe:  Short-Term Goal Priority:  High Start Date:   03/24/20                          Expected End Date:    04/27/20  Follow Up Date 02/24/21  - clean and dry skin well  Continue wound vac as prescribed by physician.           Why is this important?   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   Taking really good care of your skin  will help to keep your skin unbroken.    Notes: Patient denies signs of infection.  Wet to dry dressings daily. Wound closing up slowly.  Home health nurse 2/week. 04/15/20-patient with recent debridement of right foot surgical site.  Area now red with some bleeding to site per patient.  Santyl dressing daily. 05/02/20 Patient reports wound is doing good.  He did have a bleeding episode that was stopped with pressure to site. Home health continues 2/wk visits.   06/29/20 Wound almost healed.  Discussed continued use of boot to relieve pressure. 09/11/20 Wound almost healed.  Patient careful not to wear anything on foot that causes pressure. Wound Management Discussed Take antibiotics as prescribed Monitor for swelling, redness, pain, pus, and fever Keep wound clean and dry.   Notify physician immediately for any changes   10/08/20 Patient reports only a small area open now and just using a Band-Aid to cover.  Discussed wearing shoes and avoid wearing shoes that would apply pressure to area.   Wound Management Reiterated Monitor for swelling, redness, pain, pus, and fever Keep wound clean and dry.   Notify physician immediately for any changes  11/17/20 Patient still has only a small area open on right foot. Reviewed importance of wound monitoring for infection or worsening.   12/21/20 Patient with I& D  to right foot.  Has wound vac to foot with close follow up with physician and home health.  Reiterated signs of infection and when to notify physician.   01/04/21 Patient reports that the wound vac malfunctioned over the weekend. Waiting for a new wound vac to come in.  Wet to dry dressing being used now. Patient denies any signs of infection.  Patient to see  surgeon on 01/11/21 for wound check. No transportation problems.  01/20/21 Patient wound has made good progress per patient.  Wound vac continues.  Home health nurse continues 3/wk.  Reiterated signs of infection.   80/16/55 Resolving duplicate goal       COMPLETED: THN-Track and Manage Symptoms (pt-stated)        Barriers: Health Behaviors Knowledge Timeframe:  Long-Range Goal Priority:  High Start Date:  03/24/20                           Expected End Date: 09/24/21  Follow up 02/24/21  - follow rescue plan if symptoms flare-up - know when to call the doctor    Why is this important?   You will be able to handle your symptoms better if you keep track of them.  Making some simple changes to your lifestyle will help.  Eating healthy is one thing you can do to take good care of yourself.    Notes: 04/15/20 Weight 177 lbs.  05-02-20 Patient weight 179 lbs.   06/29/20 Patient weight 180 lbs. Keep up the great work! 09/11/20 Weight 182 lbs.  Continue weighing. Heart Failure Management Discussed Please weight daily or as ordered by your doctor Report to your doctor weight gain of 2 -3 pounds in a day or 5 pounds in a week Limit salt intake Monitor for shortness of breath, swelling of feet, ankles or abdomen and weight gain  10/08/20 Patient continues to weight daily. Weight 184 lbs and trending upward.  Discussed continuing to monitor weights and limit salt intake due to trending upward.   Heart Failure Management Reiterated Please weight daily or as ordered by your doctor Report to your doctor weight gain of  2 -3 pounds in a day or 5 pounds in a week Limit salt intake Monitor for shortness of breath, swelling of feet, ankles or abdomen and weight gain. Never use the saltshaker.  Read all food labels and avoid canned, processed, and pickled foods.   Follow your doctor's recommendations for daily salt intake. 11/17/20 Patient reports weight 179 lbs. Reviewed heart failure management and  importance.  Patient continues to weigh daily. 12/21/20 Patient continues daily weights.  Weight 172 lbs. Reiterated health failure management.  Keep up the great work.  01/04/21 Patient weight 177 lbs or less. Discussed heart failure management.  No concerns.   01/20/21 Patient weight 175 lbs. Encouraged continued heart failure management.  No concerns.   70/11/00 Resolving duplicate goal        Plan:  Follow-up: Patient agrees to Care Plan and Follow-up. Follow-up in 1 month(s)   [name], RN, MSN [place] [facility] Care Management Coordinator Direct Line [phone] Cell [phone] Toll Free: [phone]  Fax: [phone]

## 2021-03-15 NOTE — Patient Instructions (Addendum)
Patient Goals/Self-Care Activities: Right foot wound Patient will attend all scheduled provider appointments Patient will call provider office for new concerns or questions Patient will monitor for signs of infection such as swelling, redness, pain, pus, and fever Patient will keep wound and dressing clean and dry.    Patient Goals/Self-Care Activities: Heart Failure Take all medications as prescribed Attend all scheduled provider appointments call office if I gain more than 2 pounds in one day or 5 pounds in one week track weight in diary use salt in moderation watch for swelling in feet, ankles and legs every day weigh myself daily follow rescue plan if symptoms flare-up eat more whole grains, fruits and vegetables, lean meats and healthy fats

## 2021-03-17 DIAGNOSIS — I480 Paroxysmal atrial fibrillation: Secondary | ICD-10-CM | POA: Diagnosis not present

## 2021-03-17 DIAGNOSIS — I110 Hypertensive heart disease with heart failure: Secondary | ICD-10-CM | POA: Diagnosis not present

## 2021-03-17 DIAGNOSIS — T8189XD Other complications of procedures, not elsewhere classified, subsequent encounter: Secondary | ICD-10-CM | POA: Diagnosis not present

## 2021-03-17 DIAGNOSIS — E1151 Type 2 diabetes mellitus with diabetic peripheral angiopathy without gangrene: Secondary | ICD-10-CM | POA: Diagnosis not present

## 2021-03-17 DIAGNOSIS — J449 Chronic obstructive pulmonary disease, unspecified: Secondary | ICD-10-CM | POA: Diagnosis not present

## 2021-03-17 DIAGNOSIS — I5032 Chronic diastolic (congestive) heart failure: Secondary | ICD-10-CM | POA: Diagnosis not present

## 2021-03-17 DIAGNOSIS — M47812 Spondylosis without myelopathy or radiculopathy, cervical region: Secondary | ICD-10-CM | POA: Diagnosis not present

## 2021-03-17 DIAGNOSIS — I2510 Atherosclerotic heart disease of native coronary artery without angina pectoris: Secondary | ICD-10-CM | POA: Diagnosis not present

## 2021-03-17 DIAGNOSIS — D649 Anemia, unspecified: Secondary | ICD-10-CM | POA: Diagnosis not present

## 2021-03-23 DIAGNOSIS — I509 Heart failure, unspecified: Secondary | ICD-10-CM | POA: Diagnosis not present

## 2021-03-24 DIAGNOSIS — I110 Hypertensive heart disease with heart failure: Secondary | ICD-10-CM | POA: Diagnosis not present

## 2021-03-24 DIAGNOSIS — J449 Chronic obstructive pulmonary disease, unspecified: Secondary | ICD-10-CM | POA: Diagnosis not present

## 2021-03-24 DIAGNOSIS — T8189XD Other complications of procedures, not elsewhere classified, subsequent encounter: Secondary | ICD-10-CM | POA: Diagnosis not present

## 2021-03-24 DIAGNOSIS — I5032 Chronic diastolic (congestive) heart failure: Secondary | ICD-10-CM | POA: Diagnosis not present

## 2021-03-24 DIAGNOSIS — I480 Paroxysmal atrial fibrillation: Secondary | ICD-10-CM | POA: Diagnosis not present

## 2021-03-24 DIAGNOSIS — M47812 Spondylosis without myelopathy or radiculopathy, cervical region: Secondary | ICD-10-CM | POA: Diagnosis not present

## 2021-03-24 DIAGNOSIS — D649 Anemia, unspecified: Secondary | ICD-10-CM | POA: Diagnosis not present

## 2021-03-24 DIAGNOSIS — E1151 Type 2 diabetes mellitus with diabetic peripheral angiopathy without gangrene: Secondary | ICD-10-CM | POA: Diagnosis not present

## 2021-03-24 DIAGNOSIS — I2510 Atherosclerotic heart disease of native coronary artery without angina pectoris: Secondary | ICD-10-CM | POA: Diagnosis not present

## 2021-03-26 DIAGNOSIS — D649 Anemia, unspecified: Secondary | ICD-10-CM | POA: Diagnosis not present

## 2021-03-26 DIAGNOSIS — I10 Essential (primary) hypertension: Secondary | ICD-10-CM | POA: Diagnosis not present

## 2021-03-26 DIAGNOSIS — E1165 Type 2 diabetes mellitus with hyperglycemia: Secondary | ICD-10-CM | POA: Diagnosis not present

## 2021-03-26 DIAGNOSIS — E119 Type 2 diabetes mellitus without complications: Secondary | ICD-10-CM | POA: Diagnosis not present

## 2021-03-31 DIAGNOSIS — J449 Chronic obstructive pulmonary disease, unspecified: Secondary | ICD-10-CM | POA: Diagnosis not present

## 2021-03-31 DIAGNOSIS — M47812 Spondylosis without myelopathy or radiculopathy, cervical region: Secondary | ICD-10-CM | POA: Diagnosis not present

## 2021-03-31 DIAGNOSIS — I480 Paroxysmal atrial fibrillation: Secondary | ICD-10-CM | POA: Diagnosis not present

## 2021-03-31 DIAGNOSIS — I110 Hypertensive heart disease with heart failure: Secondary | ICD-10-CM | POA: Diagnosis not present

## 2021-03-31 DIAGNOSIS — E1151 Type 2 diabetes mellitus with diabetic peripheral angiopathy without gangrene: Secondary | ICD-10-CM | POA: Diagnosis not present

## 2021-03-31 DIAGNOSIS — I5032 Chronic diastolic (congestive) heart failure: Secondary | ICD-10-CM | POA: Diagnosis not present

## 2021-03-31 DIAGNOSIS — D649 Anemia, unspecified: Secondary | ICD-10-CM | POA: Diagnosis not present

## 2021-03-31 DIAGNOSIS — T8189XD Other complications of procedures, not elsewhere classified, subsequent encounter: Secondary | ICD-10-CM | POA: Diagnosis not present

## 2021-03-31 DIAGNOSIS — I2510 Atherosclerotic heart disease of native coronary artery without angina pectoris: Secondary | ICD-10-CM | POA: Diagnosis not present

## 2021-04-05 ENCOUNTER — Ambulatory Visit (INDEPENDENT_AMBULATORY_CARE_PROVIDER_SITE_OTHER): Payer: Medicare HMO | Admitting: Surgery

## 2021-04-05 ENCOUNTER — Ambulatory Visit: Payer: Medicare HMO

## 2021-04-05 ENCOUNTER — Encounter: Payer: Self-pay | Admitting: Surgery

## 2021-04-05 ENCOUNTER — Other Ambulatory Visit: Payer: Self-pay

## 2021-04-05 VITALS — BP 134/69 | HR 68 | Temp 98.0°F | Resp 20 | Ht 67.0 in | Wt 180.0 lb

## 2021-04-05 DIAGNOSIS — I7025 Atherosclerosis of native arteries of other extremities with ulceration: Secondary | ICD-10-CM | POA: Diagnosis not present

## 2021-04-05 NOTE — Progress Notes (Signed)
[facility] [place]  Patient name: Mike Wright MRN: [id number] DOB: Oct 15, 1933 Sex: male   REASON FOR VISIT:    Follow up  HISOTRY OF PRESENT ILLNESS:    Mike Wright is a 88 y.o. male who has undergone the following procedures:  08/26/2017: Angioplasty left posterior tibial and peroneal artery 09/20/2017: Angioplasty right posterior tibial artery and amputation of bilateral great toes 12/17/2017: I&D right great toe 03/01/2019: Amputation of right second toe 05/29/2019: Right third toe amputation 09/26/2019: Amputation of right fourth and fifth toe 02/04/2020: Diagnostic arteriogram 02/04/2020: Right transmetatarsal amputation 12/15/2020: Right superficial femoral artery stenting for a 80% stenosis 12/17/2020: I&D of right foot with wound VAC placement  He was last seen in the office in November for a wound check.  The wound appeared to be improving.  He was to continue daily dressing changes.  He continues to do dressing changes.  Home health comes out once a week.  He is not on antibiotics   He has a hx of chest pain and underwent cardiac catheterization in February 2019 with stent placement.  He remains on Plavix. He is on Eliquis for PAF.  He is on insulin for diabetes.  He is on nebs and inhalers for his COPD.  The pt is on a statin for cholesterol management.  He takes a beta blocker, ACEI and CCB for blood pressure management.   PAST MEDICAL HISTORY:   Past Medical History:  Diagnosis Date   Anemia    a. mild, noted 04/2017.   CAD in native artery    a. [name] 04/2017 s/p DES to D1, DES to prox-mid LAD, DES to prox LAD overlapping the prior stent, LVEF 55-65%.    Chronic diastolic CHF (congestive heart failure) (HCC)    Constipation    COPD (chronic obstructive pulmonary disease) (HCC)    Diabetic ulcer of toe (HCC)    DJD (degenerative joint disease) of cervical spine    Dysrhythmia    AFib   Essential hypertension     GERD (gastroesophageal reflux disease)    History of hiatal hernia    HIT (heparin-induced thrombocytopenia)    Hypothyroidism    Hypoxia    a. went home on home O2 04/2017.   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   Retinitis    Sinus bradycardia    a. HR 30s-40s in 04/2017 -> diltiazem stopped, metoprolol reduced.   Sleep apnea    "chose not to order CPAP at this time" (05/18/2017)   Type 2 diabetes mellitus ([place])    Wears glasses    Wheezing    a. suspected COPD 04/2017. Former tobacco x 40 years.     FAMILY HISTORY:   Family History  Problem Relation Age of Onset   Heart attack Father 18   COPD Father    COPD Mother    Heart disease Mother    Diabetes Mother    Hypertension Sister    CVA Sister    Diabetes Sister    Multiple sclerosis Sister     SOCIAL HISTORY:   Social History   Tobacco Use   Smoking status: Former    Packs/day: 2.00    Years: 30.00    Pack years: 60.00  Types: Cigarettes    Start date: 03/28/1953    Quit date: 03/29/1983    Years since quitting: 38.0   Smokeless tobacco: Never  Substance Use Topics   Alcohol use: Not Currently    Comment: 05/18/2017 'I drink a beer q yr"     ALLERGIES:   Allergies  Allergen Reactions   Bactrim [Sulfamethoxazole-Trimethoprim] Other (See Comments)    Pancytopenia   Codeine Shortness Of Breath   Doxycycline Swelling    Swelling and numbness in lips and face. Swelling improved after stopping. Reports still experiences numbness in bottom lip.    Feraheme [Ferumoxytol] Other (See Comments)    Diaphoretic, chest pain   Heparin Other (See Comments)    +HIT,  Severe bleeding (with heparin drip & large doses), tolerates low doses   Losartan Swelling   Oxycodone Other (See Comments)    "Made me act out of my  mind" Mental status changes- hallucinations   Latex Rash     CURRENT MEDICATIONS:   Current Outpatient Medications  Medication Sig Dispense Refill   Accu-Chek Softclix Lancets lancets      acetaminophen (TYLENOL) 500 MG tablet Take 1 tablet (500 mg total) by mouth every 6 (six) hours as needed for mild pain or headache. (Patient taking differently: Take 1,000 mg by mouth every 6 (six) hours as needed for mild pain or headache.) 30 tablet 0   Alcohol Swabs (B-D SINGLE USE SWABS REGULAR) PADS      apixaban (ELIQUIS) 2.5 MG TABS tablet Take 1 tablet (2.5 mg total) by mouth 2 (two) times daily. 60 tablet 11   apixaban (ELIQUIS) 2.5 MG TABS tablet Take 1 tablet (2.5 mg total) by mouth 2 (two) times daily. 180 tablet 1   aspirin EC 81 MG EC tablet Take 1 tablet (81 mg total) by mouth daily. Swallow whole. 30 tablet 11   atorvastatin (LIPITOR) 80 MG tablet Take 80 mg by mouth every evening.      Blood Glucose Monitoring Suppl (ACCU-CHEK GUIDE) w/Device KIT      Cholecalciferol (VITAMIN D3) 50 MCG (2000 UT) TABS Take 2,000 Units by mouth daily.     clopidogrel (PLAVIX) 75 MG tablet Take 1 tablet (75 mg total) by mouth daily with breakfast. 30 tablet 2   Cyanocobalamin (B-12) 2500 MCG TABS Take 2,500 mcg by mouth daily.     DROPLET INSULIN SYRINGE 31G X 5/16" 1 ML MISC      folic acid (FOLVITE) 1 MG tablet Take 1 tablet (1 mg total) by mouth daily. 30 tablet 1   furosemide (LASIX) 80 MG tablet Take 80 mg by mouth See admin instructions. Take 80 mg by mouth in the morning and between 5-6 PM daily     insulin glargine (LANTUS) 100 UNIT/ML injection Inject 30-40 Units into the skin See admin instructions. Inject 40 units into the skin in the morning and 30 units at bedtime     ipratropium-albuterol (DUONEB) 0.5-2.5 (3) MG/3ML SOLN Take 3 mLs by nebulization every other day.     isosorbide mononitrate (IMDUR) 30 MG 24 hr tablet TAKE 1 AND 1/2 TABLETS IN THE MORNING AND AT BEDTIME (DOSE INCREASE) 270  tablet 3   lisinopril (ZESTRIL) 5 MG tablet Take 0.5 tablets (2.5 mg total) by mouth daily.     meclizine (ANTIVERT) 25 MG tablet Take 12.5 mg by mouth daily as needed for dizziness.     metFORMIN (GLUCOPHAGE) 1000 MG tablet Take 1,000 mg by mouth 2 (two) times daily.  nitroGLYCERIN (NITROSTAT) 0.4 MG SL tablet PLACE 1 TABLET (0.4 MG TOTAL) UNDER THE TONGUE EVERY 5  MINUTES AS NEEDED FOR CHEST PAIN. 25 tablet 3   ondansetron (ZOFRAN) 4 MG tablet Take 4 mg by mouth daily as needed for nausea/vomiting.     OXYGEN Inhale 2 L/min into the lungs See admin instructions. 2 L/min of oxygen at bedtime and during the day as needed for shortness of breath     pantoprazole (PROTONIX) 40 MG tablet Take 1 tablet (40 mg total) by mouth 2 (two) times daily. To protect stomach while taking multiple blood thinners. (Patient taking differently: Take 40 mg by mouth daily. To protect stomach while taking multiple blood thinners.) 60 tablet 2   potassium chloride SA (K-DUR) 20 MEQ tablet Take 1 tablet (20 mEq total) by mouth daily. 30 tablet 1   psyllium (METAMUCIL) 58.6 % packet Take 0.5 packets by mouth daily. [place] 18 MCG inhalation capsule Place 1 capsule into inhaler and inhale daily after breakfast.      sulfamethoxazole-trimethoprim (BACTRIM DS) 800-160 MG tablet Take 1 tablet by mouth 2 (two) times daily. 42 tablet 0   traMADol (ULTRAM) 50 MG tablet Take 1 tablet (50 mg total) by mouth every 6 (six) hours as needed. 20 tablet 0   traZODone (DESYREL) 100 MG tablet Take 150 mg by mouth at bedtime.     TRUE METRIX BLOOD GLUCOSE TEST test strip      No current facility-administered medications for this visit.    REVIEW OF SYSTEMS:   _0  denotes positive finding, _1  denotes negative finding Cardiac  Comments:  Chest pain or chest pressure:    Shortness of breath upon exertion:    Short of breath when lying flat:    Irregular heart rhythm:        Vascular    Pain in calf,  thigh, or hip brought on by ambulation:    Pain in feet at night that wakes you up from your sleep:     Blood clot in your veins:    Leg swelling:         Pulmonary    Oxygen at home:    Productive cough:     Wheezing:         Neurologic    Sudden weakness in arms or legs:     Sudden numbness in arms or legs:     Sudden onset of difficulty speaking or slurred speech:    Temporary loss of vision in one eye:     Problems with dizziness:         Gastrointestinal    Blood in stool:     Vomited blood:         Genitourinary    Burning when urinating:     Blood in urine:        Psychiatric    Major depression:         Hematologic    Bleeding problems:    Problems with blood clotting too easily:        Skin    Rashes or ulcers:        Constitutional    Fever or chills:      PHYSICAL EXAM:   There were no vitals filed for this visit.  GENERAL: The patient is a well-nourished male, in no acute distress. The vital signs are documented above. CARDIAC: There is a regular rate and rhythm.  VASCULAR: Excellent posterior  tibial Doppler signal on the right PULMONARY: Non-labored respirations MUSCULOSKELETAL: There are no major deformities or cyanosis. NEUROLOGIC: No focal weakness or paresthesias are detected. SKIN: See photo below PSYCHIATRIC: The patient has a normal affect.   STUDIES:   None  MEDICAL ISSUES:   Right foot wound: This has nearly healed, however there is a small residual area that continues to drain.  I used silver nitrate on this today.  We talked about hyperbaric oxygen, he however has transportation limitations.  I would like for him to continue with his dressing changes and cleansing it with soap and water.  We will follow-up in 6 weeks with ABIs    Leia Alf, MD, FACS [facility] [phone] Pager [phone]

## 2021-04-09 ENCOUNTER — Other Ambulatory Visit: Payer: Self-pay | Admitting: *Deleted

## 2021-04-09 DIAGNOSIS — I2510 Atherosclerotic heart disease of native coronary artery without angina pectoris: Secondary | ICD-10-CM | POA: Diagnosis not present

## 2021-04-09 DIAGNOSIS — I110 Hypertensive heart disease with heart failure: Secondary | ICD-10-CM | POA: Diagnosis not present

## 2021-04-09 DIAGNOSIS — T8189XD Other complications of procedures, not elsewhere classified, subsequent encounter: Secondary | ICD-10-CM | POA: Diagnosis not present

## 2021-04-09 DIAGNOSIS — J449 Chronic obstructive pulmonary disease, unspecified: Secondary | ICD-10-CM | POA: Diagnosis not present

## 2021-04-09 DIAGNOSIS — E1151 Type 2 diabetes mellitus with diabetic peripheral angiopathy without gangrene: Secondary | ICD-10-CM | POA: Diagnosis not present

## 2021-04-09 DIAGNOSIS — I739 Peripheral vascular disease, unspecified: Secondary | ICD-10-CM

## 2021-04-09 DIAGNOSIS — M47812 Spondylosis without myelopathy or radiculopathy, cervical region: Secondary | ICD-10-CM | POA: Diagnosis not present

## 2021-04-09 DIAGNOSIS — I5032 Chronic diastolic (congestive) heart failure: Secondary | ICD-10-CM | POA: Diagnosis not present

## 2021-04-09 DIAGNOSIS — I480 Paroxysmal atrial fibrillation: Secondary | ICD-10-CM | POA: Diagnosis not present

## 2021-04-09 DIAGNOSIS — D649 Anemia, unspecified: Secondary | ICD-10-CM | POA: Diagnosis not present

## 2021-04-14 DIAGNOSIS — J449 Chronic obstructive pulmonary disease, unspecified: Secondary | ICD-10-CM | POA: Diagnosis not present

## 2021-04-14 DIAGNOSIS — D649 Anemia, unspecified: Secondary | ICD-10-CM | POA: Diagnosis not present

## 2021-04-14 DIAGNOSIS — I480 Paroxysmal atrial fibrillation: Secondary | ICD-10-CM | POA: Diagnosis not present

## 2021-04-14 DIAGNOSIS — E1151 Type 2 diabetes mellitus with diabetic peripheral angiopathy without gangrene: Secondary | ICD-10-CM | POA: Diagnosis not present

## 2021-04-14 DIAGNOSIS — M47812 Spondylosis without myelopathy or radiculopathy, cervical region: Secondary | ICD-10-CM | POA: Diagnosis not present

## 2021-04-14 DIAGNOSIS — T8189XD Other complications of procedures, not elsewhere classified, subsequent encounter: Secondary | ICD-10-CM | POA: Diagnosis not present

## 2021-04-14 DIAGNOSIS — I5032 Chronic diastolic (congestive) heart failure: Secondary | ICD-10-CM | POA: Diagnosis not present

## 2021-04-14 DIAGNOSIS — I2510 Atherosclerotic heart disease of native coronary artery without angina pectoris: Secondary | ICD-10-CM | POA: Diagnosis not present

## 2021-04-14 DIAGNOSIS — I110 Hypertensive heart disease with heart failure: Secondary | ICD-10-CM | POA: Diagnosis not present

## 2021-04-19 ENCOUNTER — Encounter (INDEPENDENT_AMBULATORY_CARE_PROVIDER_SITE_OTHER): Payer: Medicare HMO | Admitting: Ophthalmology

## 2021-04-19 ENCOUNTER — Other Ambulatory Visit: Payer: Self-pay

## 2021-04-19 NOTE — Patient Outreach (Signed)
[facility]) Care Management  04/19/2021  Mike Wright 22-Nov-1933 [id number]   Telephone Assessment   Successful outreach call placed to patient. He reports that he is doing well. He is pleased to share that his wound is "healing real well now." He is hopeful that it will be completely healed soon. Appetite good. Wgt stable at 178 lbs. He denies swelling or other HF sxs at present. Patient reports he accidentally fell about 2wks ago while trying to go to the bathroom during the night. He had small bruising to knee but otherwise okay. He is using walker to assist in ambulation. Supportive spouse in the home to assist him as needed. He denies any RN CM needs or concerns at this time.    Medications Reviewed Today     Reviewed by Hayden Pedro, RN (Registered Nurse) on 04/19/21 at 1344  Med List Status: <None>   Medication Order Taking? Sig Documenting Provider Last Dose Status Informant  Accu-Chek Softclix Lancets lancets [id number] No  [provider] Taking Active Self  acetaminophen (TYLENOL) 500 MG tablet [id number] No Take 1 tablet (500 mg total) by mouth every 6 (six) hours as needed for mild pain or headache.  Patient taking differently: Take 1,000 mg by mouth every 6 (six) hours as needed for mild pain or headache.   Allie Bossier, MD Taking Active   Alcohol Swabs (B-D SINGLE USE SWABS REGULAR) PADS [id number] No  [provider] Taking Active Self  apixaban (ELIQUIS) 2.5 MG TABS tablet [id number] No Take 1 tablet (2.5 mg total) by mouth 2 (two) times daily. Karoline Caldwell, PA-C Taking Active   apixaban (ELIQUIS) 2.5 MG TABS tablet [id number] No Take 1 tablet (2.5 mg total) by mouth 2 (two) times daily. Arnoldo Lenis, MD Taking Active   aspirin EC 81 MG EC tablet [id number] No Take 1 tablet (81 mg total) by mouth daily. Swallow whole. Baglia, Corrina, PA-C Taking Active   atorvastatin (LIPITOR) 80 MG tablet [id number] No Take 80 mg  by mouth every evening.  [provider] Taking Active Self  Blood Glucose Monitoring Suppl (ACCU-CHEK GUIDE) w/Device KIT [id number] No  [provider] Taking Active Self  Cholecalciferol (VITAMIN D3) 50 MCG (2000 UT) TABS [id number] No Take 2,000 Units by mouth daily. [provider] Taking Active Self  clopidogrel (PLAVIX) 75 MG tablet [id number] No Take 1 tablet (75 mg total) by mouth daily with breakfast. Baglia, Corrina, PA-C Taking Active   Cyanocobalamin (B-12) 2500 MCG TABS [id number] No Take 2,500 mcg by mouth daily. [provider] Taking Active Self  DROPLET INSULIN SYRINGE 31G X 5/16" 1 ML MISC [id number] No  [provider] Taking Active Self  folic acid (FOLVITE) 1 MG tablet [id number] No Take 1 tablet (1 mg total) by mouth daily. Oswald Hillock, MD Taking Active Self           Med Note Bertell Maria, [facility]   Tue Dec 11, 2018  1:03 PM)    furosemide (LASIX) 80 MG tablet [id number] No Take 80 mg by mouth See admin instructions. Take 80 mg by mouth in the morning and between 5-6 PM daily [provider] Taking Active Self           Med Note Duffy Bruce, Sherrie Mustache Feb 02, 2020  8:31 PM)    insulin glargine (LANTUS) 100 UNIT/ML injection [id number] No Inject 30-40 Units into the skin See admin instructions. Inject 40 units into the skin  in the morning and 30 units at bedtime [provider] Taking Active Self           Med Note Gentry Roch   Fri Dec 11, 2020 11:20 AM)    ipratropium-albuterol (DUONEB) 0.5-2.5 (3) MG/3ML SOLN [id number] No Take 3 mLs by nebulization every other day. [provider] Taking Active Self  isosorbide mononitrate (IMDUR) 30 MG 24 hr tablet [id number] No TAKE 1 AND 1/2 TABLETS IN THE MORNING AND AT BEDTIME (DOSE INCREASE) Arnoldo Lenis, MD Taking Active   lisinopril (ZESTRIL) 5 MG tablet [id number] No Take 0.5 tablets (2.5 mg total) by mouth daily. Barton Dubois, MD Taking Active Self   meclizine (ANTIVERT) 25 MG tablet [id number] No Take 12.5 mg by mouth daily as needed for dizziness. [provider] Taking Active Self  metFORMIN (GLUCOPHAGE) 1000 MG tablet [id number] No Take 1,000 mg by mouth 2 (two) times daily. [provider] Taking Active Self  nitroGLYCERIN (NITROSTAT) 0.4 MG SL tablet [id number] No PLACE 1 TABLET (0.4 MG TOTAL) UNDER THE TONGUE EVERY 5  MINUTES AS NEEDED FOR CHEST PAIN. Arnoldo Lenis, MD Taking Active Self           Med Note ([place] Sep 25, 2019  5:51 PM)    ondansetron (ZOFRAN) 4 MG tablet [id number] No Take 4 mg by mouth daily as needed for nausea/vomiting. [provider] Taking Active   OXYGEN [id number] No Inhale 2 L/min into the lungs See admin instructions. 2 L/min of oxygen at bedtime and during the day as needed for shortness of breath [provider] Taking Active Self  pantoprazole (PROTONIX) 40 MG tablet [id number] No Take 1 tablet (40 mg total) by mouth 2 (two) times daily. To protect stomach while taking multiple blood thinners.  Patient taking differently: Take 40 mg by mouth daily. To protect stomach while taking multiple blood thinners.   Barton Dubois, MD Taking Active   potassium chloride SA (K-DUR) 20 MEQ tablet [id number] No Take 1 tablet (20 mEq total) by mouth daily. Barton Dubois, MD Taking Active Self  psyllium (METAMUCIL) 58.6 % packet [id number] No Take 0.5 packets by mouth daily. [place] [provider] Taking Active Self  SPIRIVA HANDIHALER 18 MCG inhalation capsule [id number] No Place 1 capsule into inhaler and inhale daily after breakfast.  [provider] Taking Active Self  traMADol (ULTRAM) 50 MG tablet [id number] No Take 1 tablet (50 mg total) by mouth every 6 (six) hours as needed. Karoline Caldwell, PA-C Taking Active   traZODone (DESYREL) 100 MG tablet [id number] No Take 150 mg by mouth at bedtime. [provider] Taking Active Self  TRUE  METRIX BLOOD GLUCOSE TEST test strip [id number] No  [provider] Taking Active Self            Care Plan : RN Care Manager Plan of Care  Updates made by Hayden Pedro, RN since 04/19/2021 12:00 AM     Problem: Chronic Disease Management and Care Coordination Needs for heart failure   Priority: High     Goal: Healing of chronic wound to right foot as evidenced by patient reported wound improving.   Start Date: 02/17/2021  Expected End Date: 06/25/2021  This Visit's Progress: On track  Recent Progress: On track  Priority: High  Note:   Current Barriers:  Chronic Disease Management support and education needs related to Chronic right foot wound  RNCM Clinical Goal(s):  Patient will verbalize understanding  of plan for management of chronic right foot wound  through collaboration with RN Care manager, provider, and care team.   Interventions: Education and support provided in wound care and prevention of infection Inter-disciplinary care team collaboration (see longitudinal plan of care) Evaluation of current treatment plan related to  self management and patient's adherence to plan as established by provider  Chronic Right foot woundGoal on track:  Yes. Evaluation of current treatment plan related to  Chronic right foot wound ,  self-management and patient's adherence to plan as established by provider. Discussed plans with patient for ongoing care management follow up and provided patient with direct contact information for care management team Evaluation of current treatment plan related to chronic right foot wound and patient's adherence to plan as established by provider; Wound Management Discussed       Home Health visits 3/week Monitor for signs of infection: swelling, redness, pain, pus, and fever Keep wound clean and dry.   03/15/21 [name] reports doing well.  Right foot wound just about healed.  Pictures in [place].  Discussed wound care and signs of  infection.   04/19/21-Patient pleased to report how well wound healing. Wound VAC has been off for about 6wks per pt report. HHRN continues to come 1x/wk for wound care. Spouse changing drsg other days. He goes for MD appt on 05/17/21.  Patient Goals/Self-Care Activities: Patient will attend all scheduled provider appointments Patient will call provider office for new concerns or questions Patient will monitor for signs of infection such as swelling, redness, pain, pus, and fever Patient will keep wound and dressing clean and dry.   Follow Up Plan:  Telephone follow up appointment with care management team member scheduled for:  within the month of Feb The patient has been provided with contact information for the care management team and has been advised to call with any health related questions or concerns.      Long-Range Goal: Development of plan of care for management of heart failure   Start Date: 02/17/2021  Expected End Date: 03/19/2022  This Visit's Progress: On track  Recent Progress: On track  Priority: High  Note:   Current Barriers:  Chronic Disease Management support and education needs related to CHF   RNCM Clinical Goal(s):  Patient will verbalize basic understanding of  CHF disease process and self health management plan as evidenced by No exacerbation of heart failure continue to work with RN Care Manager to address care management and care coordination needs related to  CHF as evidenced by adherence to CM Team Scheduled appointments through collaboration with RN Care manager, provider, and care team.   Interventions: Education and support related to heart failure management. Inter-disciplinary care team collaboration (see longitudinal plan of care) Evaluation of current treatment plan related to  self management and patient's adherence to plan as established by provider   Heart Failure Interventions:  (Status:  Goal on track:  Yes.) Long Term Goal Provided education  on low sodium diet Discussed importance of daily weight and advised patient to weigh and record daily Discussed the importance of keeping all appointments with provider 03/15/21 Patient reports continuing to weigh daily.  Current weight around 176 lbs.  Discussed heart failure management. 04/19/21-Patient reports wgt stable at 178lbs. Denies any edema/swelling at present.    Patient Goals/Self-Care Activities: Heart Failure Take all medications as prescribed Attend all scheduled provider appointments call office if I gain more than 2 pounds in one day or 5 pounds in one  week track weight in diary use salt in moderation watch for swelling in feet, ankles and legs every day weigh myself daily follow rescue plan if symptoms flare-up eat more whole grains, fruits and vegetables, lean meats and healthy fats  Follow Up Plan:  Telephone follow up appointment with care management team member scheduled for:  within the month of Feb The patient has been provided with contact information for the care management team and has been advised to call with any health related questions or concerns.       Plan: RN CM discussed with patient next outreach within the month of Feb. Patient agrees to care plan and follow up.  Enzo Montgomery, RN,BSN,CCM [place] Management Telephonic Care Management Coordinator Direct Phone: [phone] Toll Free: [phone] Fax: [phone]

## 2021-04-21 DIAGNOSIS — I480 Paroxysmal atrial fibrillation: Secondary | ICD-10-CM | POA: Diagnosis not present

## 2021-04-21 DIAGNOSIS — M47812 Spondylosis without myelopathy or radiculopathy, cervical region: Secondary | ICD-10-CM | POA: Diagnosis not present

## 2021-04-21 DIAGNOSIS — E1151 Type 2 diabetes mellitus with diabetic peripheral angiopathy without gangrene: Secondary | ICD-10-CM | POA: Diagnosis not present

## 2021-04-21 DIAGNOSIS — J449 Chronic obstructive pulmonary disease, unspecified: Secondary | ICD-10-CM | POA: Diagnosis not present

## 2021-04-21 DIAGNOSIS — T8189XD Other complications of procedures, not elsewhere classified, subsequent encounter: Secondary | ICD-10-CM | POA: Diagnosis not present

## 2021-04-21 DIAGNOSIS — I5032 Chronic diastolic (congestive) heart failure: Secondary | ICD-10-CM | POA: Diagnosis not present

## 2021-04-21 DIAGNOSIS — I2510 Atherosclerotic heart disease of native coronary artery without angina pectoris: Secondary | ICD-10-CM | POA: Diagnosis not present

## 2021-04-21 DIAGNOSIS — D649 Anemia, unspecified: Secondary | ICD-10-CM | POA: Diagnosis not present

## 2021-04-21 DIAGNOSIS — I110 Hypertensive heart disease with heart failure: Secondary | ICD-10-CM | POA: Diagnosis not present

## 2021-04-22 ENCOUNTER — Ambulatory Visit: Payer: Self-pay

## 2021-04-23 DIAGNOSIS — I509 Heart failure, unspecified: Secondary | ICD-10-CM | POA: Diagnosis not present

## 2021-04-27 DIAGNOSIS — E119 Type 2 diabetes mellitus without complications: Secondary | ICD-10-CM | POA: Diagnosis not present

## 2021-04-27 DIAGNOSIS — I10 Essential (primary) hypertension: Secondary | ICD-10-CM | POA: Diagnosis not present

## 2021-04-28 DIAGNOSIS — E1151 Type 2 diabetes mellitus with diabetic peripheral angiopathy without gangrene: Secondary | ICD-10-CM | POA: Diagnosis not present

## 2021-04-28 DIAGNOSIS — I2510 Atherosclerotic heart disease of native coronary artery without angina pectoris: Secondary | ICD-10-CM | POA: Diagnosis not present

## 2021-04-28 DIAGNOSIS — I5032 Chronic diastolic (congestive) heart failure: Secondary | ICD-10-CM | POA: Diagnosis not present

## 2021-04-28 DIAGNOSIS — M47812 Spondylosis without myelopathy or radiculopathy, cervical region: Secondary | ICD-10-CM | POA: Diagnosis not present

## 2021-04-28 DIAGNOSIS — D649 Anemia, unspecified: Secondary | ICD-10-CM | POA: Diagnosis not present

## 2021-04-28 DIAGNOSIS — I480 Paroxysmal atrial fibrillation: Secondary | ICD-10-CM | POA: Diagnosis not present

## 2021-04-28 DIAGNOSIS — J449 Chronic obstructive pulmonary disease, unspecified: Secondary | ICD-10-CM | POA: Diagnosis not present

## 2021-04-28 DIAGNOSIS — I110 Hypertensive heart disease with heart failure: Secondary | ICD-10-CM | POA: Diagnosis not present

## 2021-04-28 DIAGNOSIS — T8189XD Other complications of procedures, not elsewhere classified, subsequent encounter: Secondary | ICD-10-CM | POA: Diagnosis not present

## 2021-05-04 DIAGNOSIS — J449 Chronic obstructive pulmonary disease, unspecified: Secondary | ICD-10-CM | POA: Diagnosis not present

## 2021-05-04 DIAGNOSIS — I5032 Chronic diastolic (congestive) heart failure: Secondary | ICD-10-CM | POA: Diagnosis not present

## 2021-05-04 DIAGNOSIS — E1151 Type 2 diabetes mellitus with diabetic peripheral angiopathy without gangrene: Secondary | ICD-10-CM | POA: Diagnosis not present

## 2021-05-04 DIAGNOSIS — D649 Anemia, unspecified: Secondary | ICD-10-CM | POA: Diagnosis not present

## 2021-05-04 DIAGNOSIS — M47812 Spondylosis without myelopathy or radiculopathy, cervical region: Secondary | ICD-10-CM | POA: Diagnosis not present

## 2021-05-04 DIAGNOSIS — T8189XD Other complications of procedures, not elsewhere classified, subsequent encounter: Secondary | ICD-10-CM | POA: Diagnosis not present

## 2021-05-04 DIAGNOSIS — I480 Paroxysmal atrial fibrillation: Secondary | ICD-10-CM | POA: Diagnosis not present

## 2021-05-04 DIAGNOSIS — I110 Hypertensive heart disease with heart failure: Secondary | ICD-10-CM | POA: Diagnosis not present

## 2021-05-04 DIAGNOSIS — I2510 Atherosclerotic heart disease of native coronary artery without angina pectoris: Secondary | ICD-10-CM | POA: Diagnosis not present

## 2021-05-10 ENCOUNTER — Other Ambulatory Visit: Payer: Self-pay

## 2021-05-10 DIAGNOSIS — I739 Peripheral vascular disease, unspecified: Secondary | ICD-10-CM

## 2021-05-10 NOTE — Progress Notes (Signed)
Error

## 2021-05-12 DIAGNOSIS — I480 Paroxysmal atrial fibrillation: Secondary | ICD-10-CM | POA: Diagnosis not present

## 2021-05-12 DIAGNOSIS — T8189XD Other complications of procedures, not elsewhere classified, subsequent encounter: Secondary | ICD-10-CM | POA: Diagnosis not present

## 2021-05-12 DIAGNOSIS — I2510 Atherosclerotic heart disease of native coronary artery without angina pectoris: Secondary | ICD-10-CM | POA: Diagnosis not present

## 2021-05-12 DIAGNOSIS — D649 Anemia, unspecified: Secondary | ICD-10-CM | POA: Diagnosis not present

## 2021-05-12 DIAGNOSIS — I5032 Chronic diastolic (congestive) heart failure: Secondary | ICD-10-CM | POA: Diagnosis not present

## 2021-05-12 DIAGNOSIS — M47812 Spondylosis without myelopathy or radiculopathy, cervical region: Secondary | ICD-10-CM | POA: Diagnosis not present

## 2021-05-12 DIAGNOSIS — I110 Hypertensive heart disease with heart failure: Secondary | ICD-10-CM | POA: Diagnosis not present

## 2021-05-12 DIAGNOSIS — E1151 Type 2 diabetes mellitus with diabetic peripheral angiopathy without gangrene: Secondary | ICD-10-CM | POA: Diagnosis not present

## 2021-05-12 DIAGNOSIS — J449 Chronic obstructive pulmonary disease, unspecified: Secondary | ICD-10-CM | POA: Diagnosis not present

## 2021-05-17 ENCOUNTER — Ambulatory Visit: Payer: Medicare HMO | Admitting: Surgery

## 2021-05-17 ENCOUNTER — Other Ambulatory Visit: Payer: Self-pay | Admitting: *Deleted

## 2021-05-17 ENCOUNTER — Other Ambulatory Visit: Payer: Self-pay

## 2021-05-17 ENCOUNTER — Ambulatory Visit (HOSPITAL_COMMUNITY)
Admission: RE | Admit: 2021-05-17 | Discharge: 2021-05-17 | Disposition: A | Discharge status: Home / Self Care | Payer: Medicare HMO | Source: Ambulatory Visit | Attending: Surgery | Admitting: Surgery

## 2021-05-17 ENCOUNTER — Ambulatory Visit (INDEPENDENT_AMBULATORY_CARE_PROVIDER_SITE_OTHER)
Admission: RE | Admit: 2021-05-17 | Discharge: 2021-05-17 | Disposition: A | Discharge status: Home / Self Care | Payer: Medicare HMO | Source: Ambulatory Visit | Attending: Surgery | Admitting: Surgery

## 2021-05-17 ENCOUNTER — Encounter: Payer: Self-pay | Admitting: Surgery

## 2021-05-17 VITALS — BP 126/60 | HR 97 | Temp 97.9°F | Resp 20 | Ht 67.0 in | Wt 180.0 lb

## 2021-05-17 DIAGNOSIS — I739 Peripheral vascular disease, unspecified: Secondary | ICD-10-CM

## 2021-05-17 DIAGNOSIS — I7025 Atherosclerosis of native arteries of other extremities with ulceration: Secondary | ICD-10-CM

## 2021-05-17 NOTE — Progress Notes (Signed)
[facility] [place]  Patient name: Mike Wright MRN: [id number] DOB: 04/23/1933 Sex: male   REASON FOR VISIT:   Follow up  HISOTRY OF PRESENT ILLNESS:    Mike Wright is a 88 y.o. male who has undergone bilateral transmetatarsal amputation.  More recently, he had to have debridement of his right TMA in September 2022.  He is back today for a wound check.  He also states that he has a new issue on his right index finger that has been there for about 2 weeks.   PAST MEDICAL HISTORY:   Past Medical History:  Diagnosis Date   Anemia    a. mild, noted 04/2017.   CAD in native artery    a. [name] 04/2017 s/p DES to D1, DES to prox-mid LAD, DES to prox LAD overlapping the prior stent, LVEF 55-65%.    Chronic diastolic CHF (congestive heart failure) (HCC)    Constipation    COPD (chronic obstructive pulmonary disease) (HCC)    Diabetic ulcer of toe (HCC)    DJD (degenerative joint disease) of cervical spine    Dysrhythmia    AFib   Essential hypertension    GERD (gastroesophageal reflux disease)    History of hiatal hernia    HIT (heparin-induced thrombocytopenia)    Hypothyroidism    Hypoxia    a. went home on home O2 04/2017.   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   Retinitis    Sinus bradycardia    a. HR 30s-40s in 04/2017 -> diltiazem stopped, metoprolol reduced.   Sleep apnea    "chose not to order CPAP at this time" (05/18/2017)   Type 2 diabetes mellitus ([place])    Wears glasses    Wheezing    a. suspected COPD 04/2017. Former tobacco x 40 years.     FAMILY HISTORY:   Family History  Problem Relation Age of Onset   Heart attack Father 33   COPD Father    COPD Mother    Heart disease Mother    Diabetes Mother     Hypertension Sister    CVA Sister    Diabetes Sister    Multiple sclerosis Sister     SOCIAL HISTORY:   Social History   Tobacco Use   Smoking status: Former    Packs/day: 2.00    Years: 30.00    Pack years: 60.00    Types: Cigarettes    Start date: 03/28/1953    Quit date: 03/29/1983    Years since quitting: 38.1   Smokeless tobacco: Never  Substance Use Topics   Alcohol use: Not Currently    Comment: 05/18/2017 'I drink a beer q yr"     ALLERGIES:   Allergies  Allergen Reactions   Bactrim [Sulfamethoxazole-Trimethoprim] Other (See Comments)    Pancytopenia   Codeine Shortness Of Breath   Doxycycline Swelling    Swelling and numbness in lips and face. Swelling improved after stopping. Reports still experiences numbness in bottom lip.    Feraheme [Ferumoxytol] Other (See Comments)    Diaphoretic, chest pain   Heparin Other (See Comments)    +HIT,  Severe bleeding (with heparin drip & large doses), tolerates low doses   Losartan Swelling   Oxycodone  Other (See Comments)    "Made me act out of my mind" Mental status changes- hallucinations   Latex Rash     CURRENT MEDICATIONS:   Current Outpatient Medications  Medication Sig Dispense Refill   Accu-Chek Softclix Lancets lancets      acetaminophen (TYLENOL) 500 MG tablet Take 1 tablet (500 mg total) by mouth every 6 (six) hours as needed for mild pain or headache. (Patient taking differently: Take 1,000 mg by mouth every 6 (six) hours as needed for mild pain or headache.) 30 tablet 0   Alcohol Swabs (B-D SINGLE USE SWABS REGULAR) PADS      apixaban (ELIQUIS) 2.5 MG TABS tablet Take 1 tablet (2.5 mg total) by mouth 2 (two) times daily. 60 tablet 11   apixaban (ELIQUIS) 2.5 MG TABS tablet Take 1 tablet (2.5 mg total) by mouth 2 (two) times daily. 180 tablet 1   aspirin EC 81 MG EC tablet Take 1 tablet (81 mg total) by mouth daily. Swallow whole. 30 tablet 11   atorvastatin (LIPITOR) 80 MG tablet Take 80 mg by mouth  every evening.      Blood Glucose Monitoring Suppl (ACCU-CHEK GUIDE) w/Device KIT      Cholecalciferol (VITAMIN D3) 50 MCG (2000 UT) TABS Take 2,000 Units by mouth daily.     clopidogrel (PLAVIX) 75 MG tablet Take 1 tablet (75 mg total) by mouth daily with breakfast. 30 tablet 2   Cyanocobalamin (B-12) 2500 MCG TABS Take 2,500 mcg by mouth daily.     DROPLET INSULIN SYRINGE 31G X 5/16" 1 ML MISC      folic acid (FOLVITE) 1 MG tablet Take 1 tablet (1 mg total) by mouth daily. 30 tablet 1   furosemide (LASIX) 80 MG tablet Take 80 mg by mouth See admin instructions. Take 80 mg by mouth in the morning and between 5-6 PM daily     insulin glargine (LANTUS) 100 UNIT/ML injection Inject 30-40 Units into the skin See admin instructions. Inject 40 units into the skin in the morning and 30 units at bedtime     ipratropium-albuterol (DUONEB) 0.5-2.5 (3) MG/3ML SOLN Take 3 mLs by nebulization every other day.     isosorbide mononitrate (IMDUR) 30 MG 24 hr tablet TAKE 1 AND 1/2 TABLETS IN THE MORNING AND AT BEDTIME (DOSE INCREASE) 270 tablet 3   lisinopril (ZESTRIL) 5 MG tablet Take 0.5 tablets (2.5 mg total) by mouth daily.     meclizine (ANTIVERT) 25 MG tablet Take 12.5 mg by mouth daily as needed for dizziness.     metFORMIN (GLUCOPHAGE) 1000 MG tablet Take 1,000 mg by mouth 2 (two) times daily.     nitroGLYCERIN (NITROSTAT) 0.4 MG SL tablet PLACE 1 TABLET (0.4 MG TOTAL) UNDER THE TONGUE EVERY 5  MINUTES AS NEEDED FOR CHEST PAIN. 25 tablet 3   ondansetron (ZOFRAN) 4 MG tablet Take 4 mg by mouth daily as needed for nausea/vomiting.     OXYGEN Inhale 2 L/min into the lungs See admin instructions. 2 L/min of oxygen at bedtime and during the day as needed for shortness of breath     pantoprazole (PROTONIX) 40 MG tablet Take 1 tablet (40 mg total) by mouth 2 (two) times daily. To protect stomach while taking multiple blood thinners. (Patient taking differently: Take 40 mg by mouth daily. To protect stomach while  taking multiple blood thinners.) 60 tablet 2   potassium chloride SA (K-DUR) 20 MEQ tablet Take 1 tablet (20 mEq total) by mouth daily.  30 tablet 1   psyllium (METAMUCIL) 58.6 % packet Take 0.5 packets by mouth daily. MIX AND DRINK     SPIRIVA HANDIHALER 18 MCG inhalation capsule Place 1 capsule into inhaler and inhale daily after breakfast.      traMADol (ULTRAM) 50 MG tablet Take 1 tablet (50 mg total) by mouth every 6 (six) hours as needed. 20 tablet 0   traZODone (DESYREL) 100 MG tablet Take 150 mg by mouth at bedtime.     TRUE METRIX BLOOD GLUCOSE TEST test strip      No current facility-administered medications for this visit.    REVIEW OF SYSTEMS:   '[X]'$  denotes positive finding, $RemoveBeforeDEI'[ ]'[id number]$  denotes negative finding Cardiac  Comments:  Chest pain or chest pressure:    Shortness of breath upon exertion:    Short of breath when lying flat:    Irregular heart rhythm:        Vascular    Pain in calf, thigh, or hip brought on by ambulation:    Pain in feet at night that wakes you up from your sleep:     Blood clot in your veins:    Leg swelling:         Pulmonary    Oxygen at home:    Productive cough:     Wheezing:         Neurologic    Sudden weakness in arms or legs:     Sudden numbness in arms or legs:     Sudden onset of difficulty speaking or slurred speech:    Temporary loss of vision in one eye:     Problems with dizziness:         Gastrointestinal    Blood in stool:     Vomited blood:         Genitourinary    Burning when urinating:     Blood in urine:        Psychiatric    Major depression:         Hematologic    Bleeding problems:    Problems with blood clotting too easily:        Skin    Rashes or ulcers:        Constitutional    Fever or chills:      PHYSICAL EXAM:   Vitals:   05/17/21 1500  BP: 126/60  Pulse: 97  Resp: 20  Temp: 97.9 F (36.6 C)  SpO2: 97%  Weight: 180 lb (81.6 kg)  Height: $Remove'5\' 7"'wmPHRbR$  (1.702 m)    GENERAL: The patient is a  well-nourished male, in no acute distress. The vital signs are documented above. CARDIAC: There is a regular rate and rhythm.  PULMONARY: Non-labored respirations MUSCULOSKELETAL: There are no major deformities or cyanosis. NEUROLOGIC: No focal weakness or paresthesias are detected. SKIN: See photo below PSYCHIATRIC: The patient has a normal affect.     STUDIES:   I have reviewed the following:  ABI/TBI Today's ABI Today's TBI Previous ABI Previous TBI   +-------+-----------+-----------+------------+------------+   Right   0.73        Amputation  1.05         Amputation     +-------+-----------+-----------+------------+------------+   Left    1.03        Amputation  1.23         Amputation     +-------+-----------+-----------+------------+------------+   +----------+--------+-----+--------+----------+--------+   RIGHT      PSV cm/s Ratio Stenosis Waveform   Comments   +----------+--------+-----+--------+----------+--------+  CFA Distal 92                      triphasic             +----------+--------+-----+--------+----------+--------+   SFA Prox   70                      triphasic             +----------+--------+-----+--------+----------+--------+   SFA Mid    135                     biphasic              +----------+--------+-----+--------+----------+--------+   SFA Distal 235                     triphasic             +----------+--------+-----+--------+----------+--------+   POP Prox   55                      monophasic            +----------+--------+-----+--------+----------+--------+   POP Distal 48                      triphasic             +----------+--------+-----+--------+----------+--------+   A focal velocity elevation of 351 cm/s was obtained at Proximal posterior  tibial with a VR of 2.2. Findings are characteristic of 50-74% stenosis.      Right Stent(s):  +---------------+--------+--------+---------+--------+   Proximal SFA    PSV cm/s Stenosis Waveform   Comments   +---------------+--------+--------+---------+--------+   Prox to Stent   82                triphasic            +---------------+--------+--------+---------+--------+   Proximal Stent  81                triphasic            +---------------+--------+--------+---------+--------+   Mid Stent       72                triphasic            +---------------+--------+--------+---------+--------+   Distal Stent    105               triphasic            +---------------+--------+--------+---------+--------+   Distal to Stent 120               biphasic             +---------------+--------+--------+---------+--------+           Summary:  Right: 50-74% stenosis noted in the posterior tibial artery. Patent stent  with no evidence of stenosis in the Proximal SFA artery.    MEDICAL ISSUES:   Right foot wound: I debrided this with a Q-tip and he has excellent bleeding from this.  It is unclear why this has not healed.  We are going to change him to Aquacel.  He will follow-up in 6 weeks  Ultrasound showed today that his SFA stent on the right is widely patent.  His dominant runoff is the posterior tibial.  There does appear to be a stenosis here however this was not visualized on angiography last year.  I doubt that  this is having an impact.  Right index finger: His wound has only been there for 2 weeks.  This has happened before.  He has a palpable right radial pulse.  I told him to monitor it and if it appears to be getting worse, he will need to be referred to a hand surgeon.    Leia Alf, MD, FACS [facility] [phone] Pager [phone]

## 2021-05-19 ENCOUNTER — Other Ambulatory Visit: Payer: Self-pay

## 2021-05-19 DIAGNOSIS — T8189XD Other complications of procedures, not elsewhere classified, subsequent encounter: Secondary | ICD-10-CM | POA: Diagnosis not present

## 2021-05-19 DIAGNOSIS — M47812 Spondylosis without myelopathy or radiculopathy, cervical region: Secondary | ICD-10-CM | POA: Diagnosis not present

## 2021-05-19 DIAGNOSIS — D649 Anemia, unspecified: Secondary | ICD-10-CM | POA: Diagnosis not present

## 2021-05-19 DIAGNOSIS — I2510 Atherosclerotic heart disease of native coronary artery without angina pectoris: Secondary | ICD-10-CM | POA: Diagnosis not present

## 2021-05-19 DIAGNOSIS — I480 Paroxysmal atrial fibrillation: Secondary | ICD-10-CM | POA: Diagnosis not present

## 2021-05-19 DIAGNOSIS — E1151 Type 2 diabetes mellitus with diabetic peripheral angiopathy without gangrene: Secondary | ICD-10-CM | POA: Diagnosis not present

## 2021-05-19 DIAGNOSIS — I110 Hypertensive heart disease with heart failure: Secondary | ICD-10-CM | POA: Diagnosis not present

## 2021-05-19 DIAGNOSIS — J449 Chronic obstructive pulmonary disease, unspecified: Secondary | ICD-10-CM | POA: Diagnosis not present

## 2021-05-19 DIAGNOSIS — I5032 Chronic diastolic (congestive) heart failure: Secondary | ICD-10-CM | POA: Diagnosis not present

## 2021-05-19 NOTE — Patient Outreach (Signed)
[facility]) Care Management  05/19/2021  Mike Wright 1933/04/10 [id number]   Telephone call to patient for follow up. Patient reports he is doing good.  Right foot continues to heal.  Weight 177 lbs with no swelling.  Reiterated heart failure management and wound infection.  No concerns.  Patient Care Plan: Heart Failure (Adult)  Completed 02/17/2021   Problem Identified: Symptom Exacerbation (Heart Failure) Resolved 02/17/2021     Long-Range Goal: Symptom Exacerbation Prevented or Minimized as evidenced by patient continuing to monitor weights regularly. Completed 02/17/2021  Start Date: 02/10/2020  Expected End Date: 09/24/2021  Recent Progress: On track  Priority: High  Note:   Evidence-based guidance: Establish a mutually-agreed-upon early intervention process to communicate with primary care provider when signs/symptoms worsen.  Notes: 06/29/20   Patient weighs daily and is aware of worsening symptoms.   10/08/20 Patient continues to weight daily. Weight 184 lbs and trending upward.  Discussed continuing to monitor weights and limit salt intake due to trending upward.   11/17/20 Patient weighing daily. Today's weight 179 lbs. Reiterated heart failure management.    97/58/83: Resolving duplicate goal.     Task: Identify and Minimize Risk of Heart Failure Exacerbation Completed 02/17/2021  Due Date: 09/24/2021  Priority: Routine  Responsible User: Jon Billings, RN  Note:   Care Management Activities:    - rescue (action) plan reviewed - self-awareness of signs/symptoms of worsening disease encouraged    Notes: 05-01-20 Patient knows signs of heart failure and when to notify physician.  06/29/20 Reiterated signs of heart failure and importance of weights.   10/08/20 Patient continues to weight daily. Weight 184 lbs and trending upward.  Discussed continuing to monitor weights and limit salt intake due to trending upward.   Heart Failure Management  Discussed Please weight daily or as ordered by your doctor Report to your doctor weight gain of 2 -3 pounds in a day or 5 pounds in a week Limit salt intake Monitor for shortness of breath, swelling of feet, ankles or abdomen and weight gain. Never use the saltshaker.  Read all food labels and avoid canned, processed, and pickled foods.   Follow your doctor's recommendations for daily salt intake. 11/17/20 Patient weighing daily. Today's weight 179 lbs. Reiterated heart failure management.   12/21/20 Daily weights continue.  Reiterated heart failure management.  Weight 172lbs.    01/04/21 Patient reports he is doing well. Weight staying around 177 lbs or less. Reiterated heart failure management.  No concerns. 01/20/21 Patient weight 175 lbs.  Patient continues to follow a low salt diet. Reviewed heart failure management. No concerns.      Patient Care Plan: RN Care Manager Plan of Care     Problem Identified: Chronic Disease Management and Care Coordination Needs for heart failure   Priority: High     Goal: Healing of chronic wound to right foot as evidenced by patient reported wound improving.   Start Date: 02/17/2021  Expected End Date: 08/24/2021  This Visit's Progress: On track  Recent Progress: On track  Priority: High  Note:   Current Barriers:  Chronic Disease Management support and education needs related to Chronic right foot wound  RNCM Clinical Goal(s):  Patient will verbalize understanding of plan for management of chronic right foot wound  through collaboration with RN Care manager, provider, and care team.   Interventions: Education and support provided in wound care and prevention of infection Inter-disciplinary care team collaboration (see longitudinal plan of care) Evaluation  of current treatment plan related to  self management and patient's adherence to plan as established by provider  Chronic Right foot woundGoal on track:  Yes. Evaluation of current  treatment plan related to  Chronic right foot wound ,  self-management and patient's adherence to plan as established by provider. Discussed plans with patient for ongoing care management follow up and provided patient with direct contact information for care management team Evaluation of current treatment plan related to chronic right foot wound and patient's adherence to plan as established by provider; Wound Management Discussed       Home Health visits 1/week Monitor for signs of infection: swelling, redness, pain, pus, and fever Keep wound clean and dry.   03/15/21 [name] reports doing well.  Right foot wound just about healed.  Pictures in [place].  Discussed wound care and signs of infection.   04/19/21-Patient pleased to report how well wound healing. Wound VAC has been off for about 6wks per pt report. HHRN continues to come 1x/wk for wound care. Spouse changing drsg other days. He goes for MD appt on 05/17/21.  05/19/21 Patient right foot wound continues to heal.  New pictures in epic.  Saw vascular doctor on 05-17-21.  HHRN continues for wound evaluation and wound care.    Patient Goals/Self-Care Activities: Patient will attend all scheduled provider appointments Patient will call provider office for new concerns or questions Patient will monitor for signs of infection such as swelling, redness, pain, pus, and fever Patient will keep wound and dressing clean and dry.   Follow Up Plan:  Telephone follow up appointment with care management team member scheduled for:  within the month of March The patient has been provided with contact information for the care management team and has been advised to call with any health related questions or concerns.      Long-Range Goal: Development of plan of care for management of heart failure   Start Date: 02/17/2021  Expected End Date: 03/19/2022  This Visit's Progress: On track  Recent Progress: On track  Priority: High  Note:   Current  Barriers:  Chronic Disease Management support and education needs related to CHF   RNCM Clinical Goal(s):  Patient will verbalize basic understanding of  CHF disease process and self health management plan as evidenced by No exacerbation of heart failure continue to work with RN Care Manager to address care management and care coordination needs related to  CHF as evidenced by adherence to CM Team Scheduled appointments through collaboration with RN Care manager, provider, and care team.   Interventions: Education and support related to heart failure management. Inter-disciplinary care team collaboration (see longitudinal plan of care) Evaluation of current treatment plan related to  self management and patient's adherence to plan as established by provider   Heart Failure Interventions:  (Status:  Goal on track:  Yes.) Long Term Goal Provided education on low sodium diet Discussed importance of daily weight and advised patient to weigh and record daily Discussed the importance of keeping all appointments with provider  05/19/21 Patient weight currently 177 lbs. Patient denies swelling.     Patient Goals/Self-Care Activities: Heart Failure Take all medications as prescribed Attend all scheduled provider appointments call office if I gain more than 2 pounds in one day or 5 pounds in one week track weight in diary use salt in moderation watch for swelling in feet, ankles and legs every day weigh myself daily follow rescue plan if symptoms flare-up eat more whole  grains, fruits and vegetables, lean meats and healthy fats  Follow Up Plan:  Telephone follow up appointment with care management team member scheduled for:  within the month of  March The patient has been provided with contact information for the care management team and has been advised to call with any health related questions or concerns.     Plan: RN CM will contact patient in the month of March.  Patient agrees to  follow-up and careplan.  [name], RN, MSN [facility] Care Management Care Management Coordinator Direct Line [phone] Toll Free: [phone]  Fax: [phone]

## 2021-05-19 NOTE — Patient Instructions (Addendum)
Patient Goals/Self-Care Activities: Right Foot Wound Patient will attend all scheduled provider appointments Patient will call provider office for new concerns or questions Patient will monitor for signs of infection of right foot such as swelling, redness, pain, pus, and fever Patient will keep wound and dressing clean and dry.   Patient Goals/Self-Care Activities: Heart Failure Take all medications as prescribed Attend all scheduled provider appointments call office if I gain more than 2 pounds in one day or 5 pounds in one week track weight in diary use salt in moderation watch for swelling in feet, ankles and legs every day weigh myself daily follow rescue plan if symptoms flare-up eat more whole grains, fruits and vegetables, lean meats and healthy fats

## 2021-05-24 DIAGNOSIS — I509 Heart failure, unspecified: Secondary | ICD-10-CM | POA: Diagnosis not present

## 2021-05-26 DIAGNOSIS — I110 Hypertensive heart disease with heart failure: Secondary | ICD-10-CM | POA: Diagnosis not present

## 2021-05-26 DIAGNOSIS — M47812 Spondylosis without myelopathy or radiculopathy, cervical region: Secondary | ICD-10-CM | POA: Diagnosis not present

## 2021-05-26 DIAGNOSIS — E1151 Type 2 diabetes mellitus with diabetic peripheral angiopathy without gangrene: Secondary | ICD-10-CM | POA: Diagnosis not present

## 2021-05-26 DIAGNOSIS — I5032 Chronic diastolic (congestive) heart failure: Secondary | ICD-10-CM | POA: Diagnosis not present

## 2021-05-26 DIAGNOSIS — I480 Paroxysmal atrial fibrillation: Secondary | ICD-10-CM | POA: Diagnosis not present

## 2021-05-26 DIAGNOSIS — I2510 Atherosclerotic heart disease of native coronary artery without angina pectoris: Secondary | ICD-10-CM | POA: Diagnosis not present

## 2021-05-26 DIAGNOSIS — J449 Chronic obstructive pulmonary disease, unspecified: Secondary | ICD-10-CM | POA: Diagnosis not present

## 2021-05-26 DIAGNOSIS — T8189XD Other complications of procedures, not elsewhere classified, subsequent encounter: Secondary | ICD-10-CM | POA: Diagnosis not present

## 2021-05-26 DIAGNOSIS — D649 Anemia, unspecified: Secondary | ICD-10-CM | POA: Diagnosis not present

## 2021-06-03 DIAGNOSIS — J449 Chronic obstructive pulmonary disease, unspecified: Secondary | ICD-10-CM | POA: Diagnosis not present

## 2021-06-03 DIAGNOSIS — D649 Anemia, unspecified: Secondary | ICD-10-CM | POA: Diagnosis not present

## 2021-06-03 DIAGNOSIS — I480 Paroxysmal atrial fibrillation: Secondary | ICD-10-CM | POA: Diagnosis not present

## 2021-06-03 DIAGNOSIS — M47812 Spondylosis without myelopathy or radiculopathy, cervical region: Secondary | ICD-10-CM | POA: Diagnosis not present

## 2021-06-03 DIAGNOSIS — I2510 Atherosclerotic heart disease of native coronary artery without angina pectoris: Secondary | ICD-10-CM | POA: Diagnosis not present

## 2021-06-03 DIAGNOSIS — E1151 Type 2 diabetes mellitus with diabetic peripheral angiopathy without gangrene: Secondary | ICD-10-CM | POA: Diagnosis not present

## 2021-06-03 DIAGNOSIS — T8189XD Other complications of procedures, not elsewhere classified, subsequent encounter: Secondary | ICD-10-CM | POA: Diagnosis not present

## 2021-06-03 DIAGNOSIS — I5032 Chronic diastolic (congestive) heart failure: Secondary | ICD-10-CM | POA: Diagnosis not present

## 2021-06-03 DIAGNOSIS — I110 Hypertensive heart disease with heart failure: Secondary | ICD-10-CM | POA: Diagnosis not present

## 2021-06-09 DIAGNOSIS — I2510 Atherosclerotic heart disease of native coronary artery without angina pectoris: Secondary | ICD-10-CM | POA: Diagnosis not present

## 2021-06-09 DIAGNOSIS — T8189XD Other complications of procedures, not elsewhere classified, subsequent encounter: Secondary | ICD-10-CM | POA: Diagnosis not present

## 2021-06-09 DIAGNOSIS — E1151 Type 2 diabetes mellitus with diabetic peripheral angiopathy without gangrene: Secondary | ICD-10-CM | POA: Diagnosis not present

## 2021-06-09 DIAGNOSIS — J449 Chronic obstructive pulmonary disease, unspecified: Secondary | ICD-10-CM | POA: Diagnosis not present

## 2021-06-09 DIAGNOSIS — I5032 Chronic diastolic (congestive) heart failure: Secondary | ICD-10-CM | POA: Diagnosis not present

## 2021-06-09 DIAGNOSIS — I480 Paroxysmal atrial fibrillation: Secondary | ICD-10-CM | POA: Diagnosis not present

## 2021-06-09 DIAGNOSIS — M47812 Spondylosis without myelopathy or radiculopathy, cervical region: Secondary | ICD-10-CM | POA: Diagnosis not present

## 2021-06-09 DIAGNOSIS — D649 Anemia, unspecified: Secondary | ICD-10-CM | POA: Diagnosis not present

## 2021-06-09 DIAGNOSIS — I110 Hypertensive heart disease with heart failure: Secondary | ICD-10-CM | POA: Diagnosis not present

## 2021-06-10 ENCOUNTER — Other Ambulatory Visit: Payer: Self-pay

## 2021-06-10 ENCOUNTER — Ambulatory Visit: Payer: Medicare HMO | Admitting: Physician Assistant

## 2021-06-10 ENCOUNTER — Encounter: Payer: Self-pay | Admitting: Physician Assistant

## 2021-06-10 VITALS — BP 136/67 | HR 65 | Temp 94.7°F | Ht 67.0 in | Wt 184.6 lb

## 2021-06-10 DIAGNOSIS — I739 Peripheral vascular disease, unspecified: Secondary | ICD-10-CM | POA: Diagnosis not present

## 2021-06-10 DIAGNOSIS — T8189XD Other complications of procedures, not elsewhere classified, subsequent encounter: Secondary | ICD-10-CM | POA: Diagnosis not present

## 2021-06-10 DIAGNOSIS — L98499 Non-pressure chronic ulcer of skin of other sites with unspecified severity: Secondary | ICD-10-CM | POA: Diagnosis not present

## 2021-06-10 NOTE — Progress Notes (Signed)
?Office Note  ? ? ? ?CC:  follow up ?Requesting Provider:  Monico Blitz, MD ? ?HPI: Mike Wright is a 88 y.o. ([birth year]) male who presents for wound follow up. He was seen by Dr. Trula Slade on 05/17/21 for wound check. He is s/p bilateral TMA's in September of 2022. He had developed a small superficial ulcer on dorsum of right TMA. Additionally he had developed an ulcer on his right pointer finger. He was given some new wound care instructions and instructed to follow up in 6 weeks ? ?He presents today for his follow up. His foot is he feels is doing well. They have been doing daily Aquacel dressings. There is now dry scab. No pain. No drainage.  ? ?His right index finger is also improved. No tenderness. No drainage. Some swelling of the distal finger.  ? ?The pt is on a statin for cholesterol management.  ?The pt is not on a daily aspirin.   Other AC:  Plavix, Eliquis ?The pt is on ACE, for hypertension.   ?The pt is diabetic.   ?Tobacco hx:  former, quit 1985 ? ?Past Medical History:  ?Diagnosis Date  ? Anemia   ? a. mild, noted 04/2017.  ? CAD in native artery   ? a. [name] 04/2017 s/p DES to D1, DES to prox-mid LAD, DES to prox LAD overlapping the prior stent, LVEF 55-65%.   ? Chronic diastolic CHF (congestive heart failure) ([place])   ? Constipation   ? COPD (chronic obstructive pulmonary disease) ([place])   ? Diabetic ulcer of toe ([place])   ? DJD (degenerative joint disease) of cervical spine   ? Dysrhythmia   ? AFib  ? Essential hypertension   ? GERD (gastroesophageal reflux disease)   ? History of hiatal hernia   ? HIT (heparin-induced thrombocytopenia)   ? Hypothyroidism   ? Hypoxia   ? a. went home on home O2 04/2017.  ? Insomnia   ? Mixed hyperlipidemia   ? PAD (peripheral artery disease) ([place])   ? PAF (paroxysmal atrial fibrillation) ([place])   ? PVD (peripheral vascular disease) ([place])   ? Renal insufficiency   ? Retinal hemorrhage   ? lost 90% of vision.  ? Retinitis   ? Sinus bradycardia   ? a. HR 30s-40s in 04/2017 ->  diltiazem stopped, metoprolol reduced.  ? Sleep apnea   ? "chose not to order CPAP at this time" (05/18/2017)  ? Type 2 diabetes mellitus ([place])   ? Wears glasses   ? Wheezing   ? a. suspected COPD 04/2017. Former tobacco x 40 years.  ? ? ?Past Surgical History:  ?Procedure Laterality Date  ? ABDOMINAL AORTOGRAM W/LOWER EXTREMITY N/A 09/15/2017  ? Procedure: ABDOMINAL AORTOGRAM W/LOWER EXTREMITY;  Surgeon: Serafina Mitchell, MD;  Location: [facility];  Service: Cardiovascular;  Laterality: N/A;  ? ABDOMINAL AORTOGRAM W/LOWER EXTREMITY N/A 02/04/2020  ? Procedure: ABDOMINAL AORTOGRAM W/LOWER EXTREMITY;  Surgeon: Serafina Mitchell, MD;  Location: [place];  Service: Cardiovascular;  Laterality: N/A;  ? ABDOMINAL AORTOGRAM W/LOWER EXTREMITY N/A 12/15/2020  ? Procedure: ABDOMINAL AORTOGRAM W/LOWER EXTREMITY;  Surgeon: Serafina Mitchell, MD;  Location: [facility];  Service: Cardiovascular;  Laterality: N/A;  ? AMPUTATION Bilateral 09/20/2017  ? Procedure: BILATERAL GREAT TOE AMPUTATIONS INCLUDING METATARSAL HEADS;  Surgeon: Serafina Mitchell, MD;  [facility];  Service: Vascular;  Laterality: Bilateral;  ? AMPUTATION Right 03/01/2019  ? Procedure: AMPUTATION TOES;  Surgeon: Angelia Mould, MD;  [facility];  Service: Vascular;  Laterality: Right;  ? AMPUTATION Right 05/29/2019  ? Procedure: AMPUTATION RIGHT THIRD TOE;  Surgeon: Serafina Mitchell, MD;  Location: [facility];  Service: Vascular;  Laterality: Right;  ? AMPUTATION Right 09/26/2019  ? Procedure: AMPUTATION RIGHT FOURTH TOE AND FIFTH TOES;  Surgeon: Serafina Mitchell, MD;  [facility];  Service: Vascular;  Laterality: Right;  ? ANGIOPLASTY Right 09/20/2017  ? Procedure: ANGIOPLASTY RIGHT TIBIAL ARTERY;  Surgeon: Serafina Mitchell, MD;  Location: [facility];  Service: Vascular;  Laterality: Right;  ? APPENDECTOMY    ? APPLICATION OF WOUND VAC  12/17/2020  ? Procedure: APPLICATION OF WOUND VAC;  Surgeon: Serafina Mitchell, MD;  Location: [facility];   Service: Vascular;;  ? BACK SURGERY    ? CARDIAC CATHETERIZATION  1980s; 2012;  ? CATARACT EXTRACTION Left 2004  ? COLONOSCOPY  2004  ? FLEISHMAN TICS  ? COLONOSCOPY WITH PROPOFOL N/A 11/28/2018  ? Procedure: COLONOSCOPY WITH PROPOFOL;  Surgeon: Rogene Houston, MD;  [facility];  Service: Endoscopy;  Laterality: N/A;  ? CORONARY ANGIOPLASTY WITH STENT PLACEMENT  05/18/2017  ? "3 stents"  ? CORONARY STENT INTERVENTION N/A 05/18/2017  ? Procedure: CORONARY STENT INTERVENTION;  Surgeon: Jettie Booze, MD;  Location: [name];  Service: Cardiovascular;  Laterality: N/A;  ? ESOPHAGOGASTRODUODENOSCOPY (EGD) WITH PROPOFOL N/A 11/20/2017  ? Procedure: ESOPHAGOGASTRODUODENOSCOPY (EGD) WITH PROPOFOL;  Surgeon: Irene Shipper, MD;  Location: [facility];  Service: Gastroenterology;  Laterality: N/A;  ? ESOPHAGOGASTRODUODENOSCOPY (EGD) WITH PROPOFOL N/A 11/28/2018  ? Procedure: ESOPHAGOGASTRODUODENOSCOPY (EGD) WITH PROPOFOL;  Surgeon: Rogene Houston, MD;  [facility];  Service: Endoscopy;  Laterality: N/A;  ? ESOPHAGOGASTRODUODENOSCOPY (EGD) WITH PROPOFOL N/A 07/12/2019  ? Procedure: ESOPHAGOGASTRODUODENOSCOPY (EGD) WITH PROPOFOL;  Surgeon: Rogene Houston, MD;  [facility];  Service: Endoscopy;  Laterality: N/A;  125  ? GIVENS CAPSULE STUDY N/A 12/17/2018  ? Procedure: GIVENS CAPSULE STUDY;  Surgeon: Rogene Houston, MD;  [facility];  Service: Endoscopy;  Laterality: N/A;  7:30am  ? I & D EXTREMITY Right 12/17/2017  ? Procedure: IRRIGATION AND DEBRIDEMENT RIGHT GREAT TOE;  Surgeon: Serafina Mitchell, MD;  Location: [facility];  Service: Vascular;  Laterality: Right;  ? INCISION AND DRAINAGE Right 12/17/2020  ? Procedure: INCISION AND DRAINAGE OF RIGHT FOOT;  Surgeon: Serafina Mitchell, MD;  [facility];  Service: Vascular;  Laterality: Right;  ? JOINT REPLACEMENT    ? LEFT HEART CATH AND CORONARY ANGIOGRAPHY N/A 05/18/2017  ? Procedure: LEFT HEART CATH AND CORONARY  ANGIOGRAPHY;  Surgeon: Jettie Booze, MD;  Location: [facility];  Service: Cardiovascular;  Laterality: N/A;  ? LOWER EXTREMITY ANGIOGRAM Right 09/20/2017  ? Procedure: RIGHT LOWER LEG  ANGIOGRAM;  Surgeon: Serafina Mitchell, MD;  Location: [facility];  Service: Vascular;  Laterality: Right;  ? LUMBAR FUSION  2002  ? L3, 4 L4, 5 L5 S1 Fused by Dr. Glenna Fellows  ? PERIPHERAL VASCULAR BALLOON ANGIOPLASTY Left 09/15/2017  ? Procedure: PERIPHERAL VASCULAR BALLOON ANGIOPLASTY;  Surgeon: Serafina Mitchell, MD;  Location: [facility];  Service: Cardiovascular;  Laterality: Left;  PTA of Peroneal & Posterior Tibial  ? PERIPHERAL VASCULAR INTERVENTION Right 12/15/2020  ? Procedure: PERIPHERAL VASCULAR INTERVENTION;  Surgeon: Serafina Mitchell, MD;  Location: [facility];  Service: Cardiovascular;  Laterality: Right;  SFA  ? POLYPECTOMY  11/28/2018  ?  Procedure: POLYPECTOMY;  Surgeon: Rogene Houston, MD;  [facility];  Service: Endoscopy;;  duodenum  ? POSTERIOR LUMBAR FUSION    ? RHINOPLASTY    ? RIGHT HEART CATH N/A 05/18/2017  ? Procedure: RIGHT HEART CATH;  Surgeon: Jettie Booze, MD;  Location: [facility];  Service: Cardiovascular;  Laterality: N/A;  ? TEE WITHOUT CARDIOVERSION N/A 09/18/2017  ? Procedure: TRANSESOPHAGEAL ECHOCARDIOGRAM (TEE);  Surgeon: Fay Records, MD;  Location: [place] [facility];  Service: Cardiovascular;  Laterality: N/A;  ? TOTAL KNEE ARTHROPLASTY Bilateral   ? TRANSMETATARSAL AMPUTATION Right 02/04/2020  ? Procedure: RIGHT TRANSMETATARSAL AMPUTATION;  Surgeon: Serafina Mitchell, MD;  Location: [facility];  Service: Vascular;  Laterality: Right;  ? TRANSURETHRAL RESECTION OF PROSTATE  2001  ? [place]  ? ? ?Social History  ? ?Socioeconomic History  ? Marital status: Married  ?  Spouse name: patsy  ? Number of children: Not on file  ? Years of education: Not on file  ? Highest education level: Not on file  ?Occupational History  ? Not on file  ?Tobacco Use  ? Smoking  status: Former  ?  Packs/day: 2.00  ?  Years: 30.00  ?  Pack years: 60.00  ?  Types: Cigarettes  ?  Start date: 03/28/1953  ?  Quit date: 03/29/1983  ?  Years since quitting: 38.2  ? Smokeless tobacco: Never  ?[place]

## 2021-06-16 DIAGNOSIS — I2510 Atherosclerotic heart disease of native coronary artery without angina pectoris: Secondary | ICD-10-CM | POA: Diagnosis not present

## 2021-06-16 DIAGNOSIS — I5032 Chronic diastolic (congestive) heart failure: Secondary | ICD-10-CM | POA: Diagnosis not present

## 2021-06-16 DIAGNOSIS — E1151 Type 2 diabetes mellitus with diabetic peripheral angiopathy without gangrene: Secondary | ICD-10-CM | POA: Diagnosis not present

## 2021-06-16 DIAGNOSIS — T8189XD Other complications of procedures, not elsewhere classified, subsequent encounter: Secondary | ICD-10-CM | POA: Diagnosis not present

## 2021-06-16 DIAGNOSIS — D649 Anemia, unspecified: Secondary | ICD-10-CM | POA: Diagnosis not present

## 2021-06-16 DIAGNOSIS — J449 Chronic obstructive pulmonary disease, unspecified: Secondary | ICD-10-CM | POA: Diagnosis not present

## 2021-06-16 DIAGNOSIS — M47812 Spondylosis without myelopathy or radiculopathy, cervical region: Secondary | ICD-10-CM | POA: Diagnosis not present

## 2021-06-16 DIAGNOSIS — I110 Hypertensive heart disease with heart failure: Secondary | ICD-10-CM | POA: Diagnosis not present

## 2021-06-16 DIAGNOSIS — I480 Paroxysmal atrial fibrillation: Secondary | ICD-10-CM | POA: Diagnosis not present

## 2021-06-17 ENCOUNTER — Ambulatory Visit: Payer: Self-pay

## 2021-06-21 ENCOUNTER — Other Ambulatory Visit: Payer: Self-pay

## 2021-06-21 DIAGNOSIS — I509 Heart failure, unspecified: Secondary | ICD-10-CM | POA: Diagnosis not present

## 2021-06-21 NOTE — Patient Outreach (Signed)
[facility]) Care Management ? ?06/21/2021 ? ?Mike Wright ?November 29, 1933 ?[id number] ? ? ?Telephone call to patient for disease management.  Right foot wound just about healed per patient.  Heart failure management continues.  No concerns. ? ?Care Plan : RN Care Manager Plan of Care  ?Updates made by Jon Billings, RN since 06/21/2021 12:00 AM  ?  ? ?Problem: Chronic Disease Management and Care Coordination Needs for heart failure   ?Priority: High  ?  ? ?Goal: Healing of chronic wound to right foot as evidenced by patient reported wound improving.   ?Start Date: 02/17/2021  ?Expected End Date: 08/24/2021  ?This Visit's Progress: On track  ?Recent Progress: On track  ?Priority: High  ?Note:   ?Current Barriers:  ?Chronic Disease Management support and education needs related to Chronic right foot wound ? ?RNCM Clinical Goal(s):  ?Patient will verbalize understanding of plan for management of chronic right foot wound  through collaboration with RN Care manager, provider, and care team.  ? ?Interventions: ?Education and support provided in wound care and prevention of infection ?Inter-disciplinary care team collaboration (see longitudinal plan of care) ?Evaluation of current treatment plan related to  self management and patient's adherence to plan as established by provider ? ?Chronic Right foot woundGoal on track:  Yes. ?Evaluation of current treatment plan related to  Chronic right foot wound ,  self-management and patient's adherence to plan as established by provider. ?Discussed plans with patient for ongoing care management follow up and provided patient with direct contact information for care management team ?Evaluation of current treatment plan related to chronic right foot wound and patient's adherence to plan as established by provider; ?Wound Management Discussed ?      Home Health visits 1/week ?Monitor for signs of infection: swelling, redness, pain, pus, and fever ?Keep wound clean and  dry.   ?03/15/21 [name] reports doing well.  Right foot wound just about healed.  Pictures in [place].  Discussed wound care and signs of infection.   ?04/19/21-Patient pleased to report how well wound healing. Wound VAC has been off for about 6wks per pt report. HHRN continues to come 1x/wk for wound care. Spouse changing drsg other days. He goes for MD appt on 05/17/21.  ?05/19/21 Patient right foot wound continues to heal.  New pictures in epic.  Saw vascular doctor on 05-17-21.  HHRN continues for wound evaluation and wound care.   ?06/21/21 Patient pleased with foot progress. He states it is just about healed.  He goes back to the vascular office in May if area still open but if healed by then he does not have to go back.  Encouraged patient to continue wound care a ordered. Patient states he will now be getting fitted for prothesis for the end of his foot.  Information provided on [facility].   ? ?Patient Goals/Self-Care Activities: Right foot wound ?Patient will attend all scheduled provider appointments ?Patient will call provider office for new concerns or questions ?Patient will monitor for signs of infection such as swelling, redness, pain, pus, and fever ?Patient will keep wound and dressing clean and dry.  ? ?Follow Up Plan:  Telephone follow up appointment with care management team member scheduled for:  within the month of April ?The patient has been provided with contact information for the care management team and has been advised to call with any health related questions or concerns.  ? ?  ? ?Long-Range Goal: Development of plan of care for management of  heart failure   ?Start Date: 02/17/2021  ?Expected End Date: 03/19/2022  ?This Visit's Progress: On track  ?Recent Progress: On track  ?Priority: High  ?Note:   ?Current Barriers:  ?Chronic Disease Management support and education needs related to CHF  ? ?RNCM Clinical Goal(s):  ?Patient will verbalize basic understanding of  CHF disease process and  self health management plan as evidenced by No exacerbation of heart failure ?continue to work with RN Care Manager to address care management and care coordination needs related to  CHF as evidenced by adherence to CM Team Scheduled appointments through collaboration with RN Care manager, provider, and care team.  ? ?Interventions: ?Education and support related to heart failure management. ?Inter-disciplinary care team collaboration (see longitudinal plan of care) ?Evaluation of current treatment plan related to  self management and patient's adherence to plan as established by provider ? ? ?Heart Failure Interventions:  (Status:  Goal on track:  Yes.) Long Term Goal ?Provided education on low sodium diet ?Discussed importance of daily weight and advised patient to weigh and record daily ?Discussed the importance of keeping all appointments with provider ? ?06/21/21  Patient continues to weigh daily.  Last weight 180 lbs.  Patient states he stays between 176 lbs and 180 lbs.  Encouraged continued heart failure management.     ? ? ?Patient Goals/Self-Care Activities: Heart Failure ?Take all medications as prescribed ?Attend all scheduled provider appointments ?call office if I gain more than 2 pounds in one day or 5 pounds in one week ?track weight in diary ?use salt in moderation ?watch for swelling in feet, ankles and legs every day ?weigh myself daily ?follow rescue plan if symptoms flare-up ?eat more whole grains, fruits and vegetables, lean meats and healthy fats ? ?Follow Up Plan:  Telephone follow up appointment with care management team member scheduled for:  within the month of April ?The patient has been provided with contact information for the care management team and has been advised to call with any health related questions or concerns.  ?  ? ?Plan: Follow-up: Patient agrees to Care Plan and Follow-up. ?Follow-up in April. ? ?Mike Baseman, RN, MSN ?[facility] Care Management ?Care Management  Coordinator ?Direct Line [phone] ?Toll Free: [phone]  ?Fax: [phone] ? ?

## 2021-06-21 NOTE — Patient Instructions (Addendum)
Patient Goals/Self-Care Activities: Right foot wound ?Patient will attend all scheduled provider appointments ?Patient will call provider office for new concerns or questions ?Patient will monitor for signs of infection such as swelling, redness, pain, pus, and fever ?Patient will keep wound and dressing clean and dry.  ? ?Patient Goals/Self-Care Activities: Heart Failure ?Take all medications as prescribed ?Attend all scheduled provider appointments ?call office if I gain more than 2 pounds in one day or 5 pounds in one week ?track weight in diary ?use salt in moderation ?watch for swelling in feet, ankles and legs every day ?weigh myself daily ?follow rescue plan if symptoms flare-up ?eat more whole grains, fruits and vegetables, lean meats and healthy fats ?

## 2021-07-19 ENCOUNTER — Other Ambulatory Visit: Payer: Self-pay

## 2021-07-19 NOTE — Patient Outreach (Signed)
[facility]) Care Management ? ?07/19/2021 ? ?Mike Wright ?1934-02-25 ?[id number] ? ? ?Telephone call to patient for follow up. Patient reports he is doing good. He states right foot healed and home health is discontinued.  Discussed continued care for foot and monitor for any changes and if any changes please notify physician immediately.  Heart failure management continues. Current weight 178 lbs. No concerns.  ? ?Care Plan : RN Care Manager Plan of Care  ?Updates made by Jon Billings, RN since 07/19/2021 12:00 AM  ?  ? ?Problem: Chronic Disease Management and Care Coordination Needs for heart failure   ?Priority: High  ?  ? ?Goal: Healing of chronic wound to right foot as evidenced by patient reported wound improving. Completed 07/19/2021  ?Start Date: 02/17/2021  ?Expected End Date: 08/24/2021  ?Recent Progress: On track  ?Priority: High  ?Note:   ?Current Barriers:  ?Chronic Disease Management support and education needs related to Chronic right foot wound ? ?RNCM Clinical Goal(s):  ?Patient will verbalize understanding of plan for management of chronic right foot wound  through collaboration with RN Care manager, provider, and care team.  ? ?Interventions: ?Education and support provided in wound care and prevention of infection ?Inter-disciplinary care team collaboration (see longitudinal plan of care) ?Evaluation of current treatment plan related to  self management and patient's adherence to plan as established by provider ? ?Chronic Right foot woundGoal on track:  Yes. and Goal Met. ?Evaluation of current treatment plan related to  Chronic right foot wound ,  self-management and patient's adherence to plan as established by provider. ?Discussed plans with patient for ongoing care management follow up and provided patient with direct contact information for care management team ?Evaluation of current treatment plan related to chronic right foot wound and patient's adherence to plan as  established by provider; ?Wound Management Discussed ?      Home Health visits 1/week ?Monitor for signs of infection: swelling, redness, pain, pus, and fever ?Keep wound clean and dry.   ?03/15/21 [name] reports doing well.  Right foot wound just about healed.  Pictures in [place].  Discussed wound care and signs of infection.   ?04/19/21-Patient pleased to report how well wound healing. Wound VAC has been off for about 6wks per pt report. HHRN continues to come 1x/wk for wound care. Spouse changing drsg other days. He goes for MD appt on 05/17/21.  ?05/19/21 Patient right foot wound continues to heal.  New pictures in epic.  Saw vascular doctor on 05-17-21.  HHRN continues for wound evaluation and wound care.   ?06/21/21 Patient pleased with foot progress. He states it is just about healed.  He goes back to the vascular office in May if area still open but if healed by then he does not have to go back.  Encouraged patient to continue wound care a ordered. Patient states he will now be getting fitted for prothesis for the end of his foot.  Information provided on [facility].   ? ?07/19/21 Patient right food wound is healed per patient.  Home Health nurse has discontinued visits.  Patient working to get prosthesis next appointment 07-27-21 ? ?Patient Goals/Self-Care Activities: Right foot wound ?Patient will attend all scheduled provider appointments ?Patient will call provider office for new concerns or questions ?Patient will monitor for signs of infection such as swelling, redness, pain, pus, and fever ?Patient will keep wound and dressing clean and dry.  ? ?Follow Up Plan:  Telephone follow up appointment  with care management team member scheduled for:  within the month of May ?The patient has been provided with contact information for the care management team and has been advised to call with any health related questions or concerns.  ? ?  ? ?Long-Range Goal: Development of plan of care for management of heart failure    ?Start Date: 02/17/2021  ?Expected End Date: 03/19/2022  ?This Visit's Progress: On track  ?Recent Progress: On track  ?Priority: High  ?Note:   ?Current Barriers:  ?Chronic Disease Management support and education needs related to CHF  ? ?RNCM Clinical Goal(s):  ?Patient will verbalize basic understanding of  CHF disease process and self health management plan as evidenced by No exacerbation of heart failure ?continue to work with RN Care Manager to address care management and care coordination needs related to  CHF as evidenced by adherence to CM Team Scheduled appointments through collaboration with RN Care manager, provider, and care team.  ? ?Interventions: ?Education and support related to heart failure management. ?Inter-disciplinary care team collaboration (see longitudinal plan of care) ?Evaluation of current treatment plan related to  self management and patient's adherence to plan as established by provider ? ? ?Heart Failure Interventions:  (Status:  Goal on track:  Yes.) Long Term Goal ?Provided education on low sodium diet ?Discussed importance of daily weight and advised patient to weigh and record daily ?Discussed the importance of keeping all appointments with provider ? ?06/21/21  Patient continues to weigh daily.  Last weight 178 lbs.  Patient states he stays between 178 lbs and 178 lbs.  Encouraged continued heart failure management.     ? ? ?Patient Goals/Self-Care Activities: Heart Failure ?Take all medications as prescribed ?Attend all scheduled provider appointments ?call office if I gain more than 2 pounds in one day or 5 pounds in one week ?track weight in diary ?use salt in moderation ?watch for swelling in feet, ankles and legs every day ?weigh myself daily ?follow rescue plan if symptoms flare-up ?eat more whole grains, fruits and vegetables, lean meats and healthy fats ? ?Follow Up Plan:  Telephone follow up appointment with care management team member scheduled for:  within the month  of May ?The patient has been provided with contact information for the care management team and has been advised to call with any health related questions or concerns.  ?  ? ?Plan: Follow-up: Patient agrees to Care Plan and Follow-up. ?Follow-up in May. ? ?[name], RN, MSN ?[facility] Care Management ?Care Management Coordinator ?Direct Line [phone] ?Toll Free: [phone]  ?Fax: [phone] ? ?

## 2021-07-19 NOTE — Patient Instructions (Signed)
Patient Goals/Self-Care Activities: Heart Failure ?Take all medications as prescribed ?Attend all scheduled provider appointments ?call office if I gain more than 2 pounds in one day or 5 pounds in one week ?track weight in diary ?use salt in moderation ?watch for swelling in feet, ankles and legs every day ?weigh myself daily ?follow rescue plan if symptoms flare-up ?eat more whole grains, fruits and vegetables, lean meats and healthy fats ? ?

## 2021-07-22 DIAGNOSIS — I509 Heart failure, unspecified: Secondary | ICD-10-CM | POA: Diagnosis not present

## 2021-07-23 ENCOUNTER — Telehealth: Payer: Self-pay

## 2021-07-23 NOTE — Telephone Encounter (Signed)
I reached out to [name] to see if he would be willing to move his appt to a morning slot due to some appt changes. He states he felt this appt wasn't needed because his wound has now healed and he isn't having any drainage,pain or signs of infection. I advised patient to contact our office if he develops any sxs or if he has any additional concerns. Pt voiced his understanding. ?

## 2021-07-26 ENCOUNTER — Ambulatory Visit: Payer: Medicare HMO

## 2021-07-27 DIAGNOSIS — S98911A Complete traumatic amputation of right foot, level unspecified, initial encounter: Secondary | ICD-10-CM | POA: Diagnosis not present

## 2021-08-02 ENCOUNTER — Ambulatory Visit: Payer: Medicare HMO

## 2021-08-19 ENCOUNTER — Other Ambulatory Visit: Payer: Self-pay

## 2021-08-19 NOTE — Patient Instructions (Signed)
Patient Goals/Self-Care Activities: Heart Failure Take all medications as prescribed Attend all scheduled provider appointments call office if I gain more than 2 pounds in one day or 5 pounds in one week track weight in diary use salt in moderation watch for swelling in feet, ankles and legs every day weigh myself daily follow rescue plan if symptoms flare-up eat more whole grains, fruits and vegetables, lean meats and healthy fats

## 2021-08-19 NOTE — Patient Outreach (Signed)
[facility]) Care Management  08/19/2021  Mike Wright 03-19-1934 [id number]   Telephone call to patient for disease management follow up. He reports doing well.  He did get prothesis for his right foot to go in his shoe for balance.  Discussed being careful to prevent falls. He verbalized understanding.  Heart failure management continues.  No concerns.    Care Plan : RN Care Manager Plan of Care  Updates made by Jon Billings, RN since 08/19/2021 12:00 AM     Problem: Chronic Disease Management and Care Coordination Needs for heart failure   Priority: High     Long-Range Goal: Development of plan of care for management of heart failure   Start Date: 02/17/2021  Expected End Date: 03/19/2022  This Visit's Progress: On track  Recent Progress: On track  Priority: High  Note:   Current Barriers:  Chronic Disease Management support and education needs related to CHF   RNCM Clinical Goal(s):  Patient will verbalize basic understanding of  CHF disease process and self health management plan as evidenced by No exacerbation of heart failure continue to work with RN Care Manager to address care management and care coordination needs related to  CHF as evidenced by adherence to CM Team Scheduled appointments through collaboration with RN Care manager, provider, and care team.   Interventions: Education and support related to heart failure management. Inter-disciplinary care team collaboration (see longitudinal plan of care) Evaluation of current treatment plan related to  self management and patient's adherence to plan as established by provider   Heart Failure Interventions:  (Status:  Goal on track:  Yes.) Long Term Goal Provided education on low sodium diet Discussed importance of daily weight and advised patient to weigh and record daily Discussed the importance of keeping all appointments with provider  06/21/21  Patient continues to weigh daily.  Last weight  178 lbs.  Patient states he stays between 177 lbs and 178 lbs.  Encouraged continued heart failure management.      08/19/21 Patient doing well. Weight today 176 lbs.  Discussed continued heart failure management.  No concerns.     Patient Goals/Self-Care Activities: Heart Failure Take all medications as prescribed Attend all scheduled provider appointments call office if I gain more than 2 pounds in one day or 5 pounds in one week track weight in diary use salt in moderation watch for swelling in feet, ankles and legs every day weigh myself daily follow rescue plan if symptoms flare-up eat more whole grains, fruits and vegetables, lean meats and healthy fats  Follow Up Plan:  Telephone follow up appointment with care management team member scheduled for:  June The patient has been provided with contact information for the care management team and has been advised to call with any health related questions or concerns.     Plan: Follow-up: Patient agrees to Care Plan and Follow-up. Follow-up in June.  [name], RN, MSN [facility] Care Management Care Management Coordinator Direct Line [phone] Toll Free: [phone]  Fax: [phone]

## 2021-08-21 DIAGNOSIS — I509 Heart failure, unspecified: Secondary | ICD-10-CM | POA: Diagnosis not present

## 2021-09-10 ENCOUNTER — Other Ambulatory Visit: Payer: Self-pay

## 2021-09-10 NOTE — Patient Instructions (Signed)
Patient Goals/Self-Care Activities: Heart Failure Take all medications as prescribed Attend all scheduled provider appointments call office if I gain more than 2 pounds in one day or 5 pounds in one week track weight in diary use salt in moderation watch for swelling in feet, ankles and legs every day weigh myself daily follow rescue plan if symptoms flare-up eat more whole grains, fruits and vegetables, lean meats and healthy fats

## 2021-09-10 NOTE — Patient Outreach (Signed)
[facility] [facility] [facility]) Care Management  09/10/2021  Mike Wright 1933-06-20 [id number]   Telephone call to patient.  Weight continues to remain stable 174-177 lbs.  Encouraged continued disease management.   No concerns.   Care Plan : RN Care Manager Plan of Care  Updates made by Jon Billings, RN since 09/10/2021 12:00 AM     Problem: Chronic Disease Management and Care Coordination Needs for heart failure   Priority: High     Long-Range Goal: Development of plan of care for management of heart failure   Start Date: 02/17/2021  Expected End Date: 03/19/2022  This Visit's Progress: On track  Recent Progress: On track  Priority: High  Note:   Current Barriers:  Chronic Disease Management support and education needs related to CHF   RNCM Clinical Goal(s):  Patient will verbalize basic understanding of  CHF disease process and self health management plan as evidenced by No exacerbation of heart failure continue to work with RN Care Manager to address care management and care coordination needs related to  CHF as evidenced by adherence to CM Team Scheduled appointments through collaboration with RN Care manager, provider, and care team.   Interventions: Education and support related to heart failure management. Inter-disciplinary care team collaboration (see longitudinal plan of care) Evaluation of current treatment plan related to  self management and patient's adherence to plan as established by provider   Heart Failure Interventions:  (Status:  Goal on track:  Yes.) Long Term Goal Provided education on low sodium diet Discussed importance of daily weight and advised patient to weigh and record daily Discussed the importance of keeping all appointments with provider  06/21/21  Patient continues to weigh daily.  Last weight 178 lbs.  Patient states he stays between 177 lbs and 178 lbs.  Encouraged continued heart failure management.      08/19/21 Patient doing well.  Weight today 176 lbs.  Discussed continued heart failure management.  No concerns.    09/10/21 Patient doing well.  Weight between 174-177 lbs.  Encouraged continued heart failure management.    No concerns.     Patient Goals/Self-Care Activities: Heart Failure Take all medications as prescribed Attend all scheduled provider appointments call office if I gain more than 2 pounds in one day or 5 pounds in one week track weight in diary use salt in moderation watch for swelling in feet, ankles and legs every day weigh myself daily follow rescue plan if symptoms flare-up eat more whole grains, fruits and vegetables, lean meats and healthy fats  Follow Up Plan:  Telephone follow up appointment with care management team member scheduled for:  July The patient has been provided with contact information for the care management team and has been advised to call with any health related questions or concerns.     Plan: Follow-up: Patient agrees to Care Plan and Follow-up. Follow-up in July.   [name], RN, MSN [facility] Care Management Care Management Coordinator Direct Line [phone] Toll Free: [phone]  Fax: [phone]

## 2021-09-21 DIAGNOSIS — I509 Heart failure, unspecified: Secondary | ICD-10-CM | POA: Diagnosis not present

## 2021-10-07 ENCOUNTER — Other Ambulatory Visit: Payer: Self-pay

## 2021-10-07 NOTE — Patient Outreach (Signed)
[facility] [facility]) Care Management  10/07/2021  TAFT WORTHING 1933-06-26 [id number]   Telephone call to patient for monthly outreach.  Line busy several times.   Plan: Will outreach next month.    [name], RN, MSN [facility] Care Management Care Management Coordinator Direct Line [phone] Toll Free: [phone]  Fax: [phone]

## 2021-10-08 ENCOUNTER — Ambulatory Visit: Payer: Self-pay

## 2021-10-15 DIAGNOSIS — Z299 Encounter for prophylactic measures, unspecified: Secondary | ICD-10-CM | POA: Diagnosis not present

## 2021-10-15 DIAGNOSIS — S98911D Complete traumatic amputation of right foot, level unspecified, subsequent encounter: Secondary | ICD-10-CM | POA: Diagnosis not present

## 2021-10-15 DIAGNOSIS — Z0000 Encounter for general adult medical examination without abnormal findings: Secondary | ICD-10-CM | POA: Diagnosis not present

## 2021-10-15 DIAGNOSIS — Z125 Encounter for screening for malignant neoplasm of prostate: Secondary | ICD-10-CM | POA: Diagnosis not present

## 2021-10-15 DIAGNOSIS — E7800 Pure hypercholesterolemia, unspecified: Secondary | ICD-10-CM | POA: Diagnosis not present

## 2021-10-15 DIAGNOSIS — Z7189 Other specified counseling: Secondary | ICD-10-CM | POA: Diagnosis not present

## 2021-10-15 DIAGNOSIS — R5383 Other fatigue: Secondary | ICD-10-CM | POA: Diagnosis not present

## 2021-10-15 DIAGNOSIS — Z79899 Other long term (current) drug therapy: Secondary | ICD-10-CM | POA: Diagnosis not present

## 2021-10-15 DIAGNOSIS — I10 Essential (primary) hypertension: Secondary | ICD-10-CM | POA: Diagnosis not present

## 2021-10-15 DIAGNOSIS — Z1339 Encounter for screening examination for other mental health and behavioral disorders: Secondary | ICD-10-CM | POA: Diagnosis not present

## 2021-10-15 DIAGNOSIS — Z6827 Body mass index (BMI) 27.0-27.9, adult: Secondary | ICD-10-CM | POA: Diagnosis not present

## 2021-10-15 DIAGNOSIS — E1165 Type 2 diabetes mellitus with hyperglycemia: Secondary | ICD-10-CM | POA: Diagnosis not present

## 2021-10-15 DIAGNOSIS — Z1331 Encounter for screening for depression: Secondary | ICD-10-CM | POA: Diagnosis not present

## 2021-10-25 DIAGNOSIS — Z299 Encounter for prophylactic measures, unspecified: Secondary | ICD-10-CM | POA: Diagnosis not present

## 2021-10-25 DIAGNOSIS — E1129 Type 2 diabetes mellitus with other diabetic kidney complication: Secondary | ICD-10-CM | POA: Diagnosis not present

## 2021-10-25 DIAGNOSIS — I509 Heart failure, unspecified: Secondary | ICD-10-CM | POA: Diagnosis not present

## 2021-10-25 DIAGNOSIS — J9611 Chronic respiratory failure with hypoxia: Secondary | ICD-10-CM | POA: Diagnosis not present

## 2021-10-25 DIAGNOSIS — I10 Essential (primary) hypertension: Secondary | ICD-10-CM | POA: Diagnosis not present

## 2021-10-26 ENCOUNTER — Encounter: Payer: Self-pay | Admitting: Cardiology

## 2021-10-26 ENCOUNTER — Ambulatory Visit: Payer: Medicare HMO | Admitting: Cardiology

## 2021-10-26 VITALS — BP 122/60 | HR 74 | Ht 67.0 in | Wt 181.4 lb

## 2021-10-26 DIAGNOSIS — I480 Paroxysmal atrial fibrillation: Secondary | ICD-10-CM

## 2021-10-26 DIAGNOSIS — I4891 Unspecified atrial fibrillation: Secondary | ICD-10-CM

## 2021-10-26 DIAGNOSIS — D6869 Other thrombophilia: Secondary | ICD-10-CM | POA: Diagnosis not present

## 2021-10-26 DIAGNOSIS — I2510 Atherosclerotic heart disease of native coronary artery without angina pectoris: Secondary | ICD-10-CM | POA: Diagnosis not present

## 2021-10-26 DIAGNOSIS — I5032 Chronic diastolic (congestive) heart failure: Secondary | ICD-10-CM | POA: Diagnosis not present

## 2021-10-26 MED ORDER — ISOSORBIDE MONONITRATE ER 60 MG PO TB24: 60.0000 mg | ORAL_TABLET | Freq: Two times a day (BID) | ORAL | 3 refills | Status: DC

## 2021-10-26 NOTE — Patient Instructions (Addendum)
Medication Instructions:  Increase Imdur to '60mg'$  twice a day  Continue all other medications.     Labwork: none  Testing/Procedures: none  Follow-Up: 6 months   Any Other Special Instructions Will Be Listed Below (If Applicable).   If you need a refill on your cardiac medications before your next appointment, please call your pharmacy.

## 2021-10-26 NOTE — Progress Notes (Signed)
Clinical Summary Mike Wright is a 88 y.o.male seen today for focused visit on recent issues with diastolic HF.      1. CAD with stable angina - nonobstructive CAD by cath Jan 2012, LVEF 60-65% by LV gram.  - 06/2015 nuclear stress without clear ischemia - 05/2015 echo LVEF 65-70%, no WMAs, cannot evaluate diastolic function  04/6331 lexiscan without ischemia, low risk study.    - cath 04/2017 as reported below. Received DES to 75% D1, DES to 75% mid to distal LAD, DES to 70% prox LAD RHC with CI 2.67, mean PA 25, no wedge reported by LVEDP 14.  - discharged on triple therapy with ASA, plavix, eliquis with plan for 30 days, then stop ASA.     - 11/22/17 admission with chest pain, anemia. Negative workup for ACS. Symptoms resolved with blood transfusion - 11/2017 admit with chest pain and dyspnea. ACS work negative, CT PE negative. Diuresed 6L with resolustion of symptoms. CXR with rib fracture thought playing a role in chest pain.     01/2020 nuclear stress: Small, mild intensity, mid to basal inferior/inferoseptal defect that is fixed and consistent with soft tissue attenuation in light of normal wall motion. No significant ischemic territories.      - as been on Eliquis and plavix due to high stent burden   - pressure like left sided. Can occur at rest or with activity, 4-5/10 in severity. Can have some SOB. Not positional. Takes NG and symptoms improved. Occurs about once 1-2 weeks.  -     2. Chronic diastolic HF - recent admission 11/2018 with acute on chronic diastolic HF - no recent SOB/DOE. No recent edema. Takes lasix 57m bid - home weights down to 184 lbs.  Hospital discharge weight 191 lbs     - no recent edema - no SOB/DOE - he is on lasix 820mbid     3. Afib - admission 11/2018 with issues with afib - from notes had afib with slow rates, toprol was stopped   - no palpitations   4.History of GI bleeding - GI bleed during 11/2018, stopped eliquis at that time,  later restarted - heme +positive stools, negative endoscopy with plans for outpatient pill endoscopy    - dark stool 3 weeks ago that has resolved.    5. PAD - followed by vascular - admission 08/2017 with ischemia great toes. Had intervention on lower extremity vessels at that time.  -  ultimately required ampuation bilateral great toes   - admit 11/2017 for poor healing right great toe amputation, critical limb ischemia - admit 02/2019 with gangrene right second toe with nonhealing diabetic foot wound. S/p amputations - additional toe amputation 05/2019   - had repeat partial amputation 01/2020 - slow healing, some intermittent bleeding still at site     6. PSVT/Palpitations.  - denies any significant palpitations.    7. Chronic anemia - has received multiple transfusions - followed by closely.   8. Hyperlipidemia - 09/2021 TC 71 TG 74 HDL 29 LDL 26 - he is on atorvastatin 8057maily.  Past Medical History:  Diagnosis Date   Anemia    a. mild, noted 04/2017.   CAD in native artery    a. [id number] s/p DES to D1, DES to prox-mid LAD, DES to prox LAD overlapping the prior stent, LVEF 55-65%.    Chronic diastolic CHF (congestive heart failure) (HCC)    Constipation    COPD (chronic obstructive pulmonary disease) (  [place])    Diabetic ulcer of toe ([place])    DJD (degenerative joint disease) of cervical spine    Dysrhythmia    AFib   Essential hypertension    GERD (gastroesophageal reflux disease)    History of hiatal hernia    HIT (heparin-induced thrombocytopenia) (HCC)    Hypothyroidism    Hypoxia    a. went home on home O2 04/2017.   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   Retinitis    Sinus bradycardia    a. HR 30s-40s in 04/2017 -> diltiazem stopped, metoprolol reduced.   Sleep  apnea    "chose not to order CPAP at this time" (05/18/2017)   Type 2 diabetes mellitus ([place])    Wears glasses    Wheezing    a. suspected COPD 04/2017. Former tobacco x 40 years.     Allergies  Allergen Reactions   Codeine Shortness Of Breath   Doxycycline Swelling    Swelling and numbness in lips and face. Swelling improved after stopping. Reports still experiences numbness in bottom lip.    Feraheme [Ferumoxytol] Other (See Comments)    Diaphoretic, chest pain   Heparin Other (See Comments)    +HIT,  Severe bleeding (with heparin drip & large doses), tolerates low doses   Losartan Swelling   Oxycodone Other (See Comments)    "Made me act out of my mind" Mental status changes- hallucinations   Latex Rash     Current Outpatient Medications  Medication Sig Dispense Refill   Accu-Chek Softclix Lancets lancets      acetaminophen (TYLENOL) 500 MG tablet Take 1 tablet (500 mg total) by mouth every 6 (six) hours as needed for mild pain or headache. (Patient taking differently: Take 1,000 mg by mouth every 6 (six) hours as needed for mild pain or headache.) 30 tablet 0   Alcohol Swabs (B-D SINGLE USE SWABS REGULAR) PADS      apixaban (ELIQUIS) 2.5 MG TABS tablet Take 1 tablet (2.5 mg total) by mouth 2 (two) times daily. (Patient taking differently: Take 5 mg by mouth 2 (two) times daily.) 60 tablet 11   atorvastatin (LIPITOR) 80 MG tablet Take 80 mg by mouth every evening.      Blood Glucose Monitoring Suppl (ACCU-CHEK GUIDE) w/Device KIT      Cholecalciferol (VITAMIN D3) 50 MCG (2000 UT) TABS Take 2,000 Units by mouth daily.     clopidogrel (PLAVIX) 75 MG tablet Take 1 tablet (75 mg total) by mouth daily with breakfast. 30 tablet 2   Cyanocobalamin (B-12) 2500 MCG TABS Take 2,500 mcg by mouth daily.     DROPLET INSULIN SYRINGE 31G X 5/16" 1 ML MISC      folic acid (FOLVITE) 1 MG tablet Take 1 tablet (1 mg total) by mouth daily. 30 tablet 1   furosemide (LASIX) 80 MG tablet Take 80 mg  by mouth See admin instructions. Take 80 mg by mouth in the morning and between 5-6 PM daily     insulin glargine (LANTUS) 100 UNIT/ML injection Inject 30-40 Units into the skin See admin instructions. Inject 40 units into the skin in the morning and 30 units at bedtime     ipratropium-albuterol (DUONEB) 0.5-2.5 (3) MG/3ML SOLN Take 3 mLs by nebulization  every other day.     isosorbide mononitrate (IMDUR) 30 MG 24 hr tablet TAKE 1 AND 1/2 TABLETS IN THE MORNING AND AT BEDTIME (DOSE INCREASE) 270 tablet 3   lisinopril (ZESTRIL) 5 MG tablet Take 0.5 tablets (2.5 mg total) by mouth daily.     meclizine (ANTIVERT) 25 MG tablet Take 12.5 mg by mouth daily as needed for dizziness.     metFORMIN (GLUCOPHAGE) 1000 MG tablet Take 1,000 mg by mouth 2 (two) times daily.     nitroGLYCERIN (NITROSTAT) 0.4 MG SL tablet PLACE 1 TABLET (0.4 MG TOTAL) UNDER THE TONGUE EVERY 5  MINUTES AS NEEDED FOR CHEST PAIN. 25 tablet 3   ondansetron (ZOFRAN) 4 MG tablet Take 4 mg by mouth daily as needed for nausea/vomiting.     OXYGEN Inhale 2 L/min into the lungs See admin instructions. 2 L/min of oxygen at bedtime and during the day as needed for shortness of breath     pantoprazole (PROTONIX) 40 MG tablet Take 1 tablet (40 mg total) by mouth 2 (two) times daily. To protect stomach while taking multiple blood thinners. (Patient taking differently: Take 40 mg by mouth daily. To protect stomach while taking multiple blood thinners.) 60 tablet 2   potassium chloride SA (K-DUR) 20 MEQ tablet Take 1 tablet (20 mEq total) by mouth daily. 30 tablet 1   psyllium (METAMUCIL) 58.6 % packet Take 0.5 packets by mouth daily. MIX AND DRINK as needed     SPIRIVA HANDIHALER 18 MCG inhalation capsule Place 1 capsule into inhaler and inhale daily after breakfast.      traMADol (ULTRAM) 50 MG tablet Take 1 tablet (50 mg total) by mouth every 6 (six) hours as needed. 20 tablet 0   traZODone (DESYREL) 100 MG tablet Take 150 mg by mouth at bedtime.      TRUE METRIX BLOOD GLUCOSE TEST test strip      No current facility-administered medications for this visit.     Past Surgical History:  Procedure Laterality Date   ABDOMINAL AORTOGRAM W/LOWER EXTREMITY N/A 09/15/2017   Procedure: ABDOMINAL AORTOGRAM W/LOWER EXTREMITY;  Surgeon: Serafina Mitchell, MD;  Location: [place] [facility];  Service: Cardiovascular;  Laterality: N/A;   ABDOMINAL AORTOGRAM W/LOWER EXTREMITY N/A 02/04/2020   Procedure: ABDOMINAL AORTOGRAM W/LOWER EXTREMITY;  Surgeon: Serafina Mitchell, MD;  Location: [place] [facility];  Service: Cardiovascular;  Laterality: N/A;   ABDOMINAL AORTOGRAM W/LOWER EXTREMITY N/A 12/15/2020   Procedure: ABDOMINAL AORTOGRAM W/LOWER EXTREMITY;  Surgeon: Serafina Mitchell, MD;  Location: [place] [facility];  Service: Cardiovascular;  Laterality: N/A;   AMPUTATION Bilateral 09/20/2017   Procedure: BILATERAL GREAT TOE AMPUTATIONS INCLUDING METATARSAL HEADS;  Surgeon: Serafina Mitchell, MD;  Location: [place];  Service: Vascular;  Laterality: Bilateral;   AMPUTATION Right 03/01/2019   Procedure: AMPUTATION TOES;  Surgeon: Angelia Mould, MD;  Location: [place];  Service: Vascular;  Laterality: Right;   AMPUTATION Right 05/29/2019   Procedure: AMPUTATION RIGHT THIRD TOE;  Surgeon: Serafina Mitchell, MD;  [facility];  Service: Vascular;  Laterality: Right;   AMPUTATION Right 09/26/2019   Procedure: AMPUTATION RIGHT FOURTH TOE AND FIFTH TOES;  Surgeon: Serafina Mitchell, MD;  [facility];  Service: Vascular;  Laterality: Right;   ANGIOPLASTY Right 09/20/2017   Procedure: ANGIOPLASTY RIGHT TIBIAL ARTERY;  Surgeon: Serafina Mitchell, MD;  [facility];  Service: Vascular;  Laterality: Right;   APPENDECTOMY     APPLICATION OF WOUND VAC  12/17/2020   Procedure: APPLICATION OF WOUND VAC;  Surgeon: Serafina Mitchell, MD;  Location: [place];  Service: Vascular;;   [place]; 2012;   CATARACT EXTRACTION Left 2004    COLONOSCOPY  2004   FLEISHMAN TICS   COLONOSCOPY WITH PROPOFOL N/A 11/28/2018   Procedure: COLONOSCOPY WITH PROPOFOL;  Surgeon: Rogene Houston, MD;  [facility];  Service: Endoscopy;  Laterality: N/A;   CORONARY ANGIOPLASTY WITH STENT PLACEMENT  05/18/2017   "3 stents"   CORONARY STENT INTERVENTION N/A 05/18/2017   Procedure: CORONARY STENT INTERVENTION;  Surgeon: Jettie Booze, MD;  Location: [place] [facility];  Service: Cardiovascular;  Laterality: N/A;   ESOPHAGOGASTRODUODENOSCOPY (EGD) WITH PROPOFOL N/A 11/20/2017   Procedure: ESOPHAGOGASTRODUODENOSCOPY (EGD) WITH PROPOFOL;  Surgeon: Irene Shipper, MD;  Location: [place];  Service: Gastroenterology;  Laterality: N/A;   ESOPHAGOGASTRODUODENOSCOPY (EGD) WITH PROPOFOL N/A 11/28/2018   Procedure: ESOPHAGOGASTRODUODENOSCOPY (EGD) WITH PROPOFOL;  Surgeon: Rogene Houston, MD;  [facility];  Service: Endoscopy;  Laterality: N/A;   ESOPHAGOGASTRODUODENOSCOPY (EGD) WITH PROPOFOL N/A 07/12/2019   Procedure: ESOPHAGOGASTRODUODENOSCOPY (EGD) WITH PROPOFOL;  Surgeon: Rogene Houston, MD;  [facility];  Service: Endoscopy;  Laterality: N/A;  [place] N/A 12/17/2018   Procedure: GIVENS CAPSULE STUDY;  Surgeon: Rogene Houston, MD;  [facility];  Service: Endoscopy;  Laterality: N/A;  7:30am   I & D EXTREMITY Right 12/17/2017   Procedure: IRRIGATION AND DEBRIDEMENT RIGHT GREAT TOE;  Surgeon: Serafina Mitchell, MD;  [facility];  Service: Vascular;  Laterality: Right;   INCISION AND DRAINAGE Right 12/17/2020   Procedure: INCISION AND DRAINAGE OF RIGHT FOOT;  Surgeon: Serafina Mitchell, MD;  [facility];  Service: Vascular;  Laterality: Right;   JOINT REPLACEMENT     LEFT HEART CATH AND CORONARY ANGIOGRAPHY N/A 05/18/2017   Procedure: LEFT HEART CATH AND CORONARY ANGIOGRAPHY;  Surgeon: Jettie Booze, MD;  Location: [place] [facility];  Service: Cardiovascular;  Laterality:  N/A;   LOWER EXTREMITY ANGIOGRAM Right 09/20/2017   Procedure: RIGHT LOWER LEG  ANGIOGRAM;  Surgeon: Serafina Mitchell, MD;  [facility];  Service: Vascular;  Laterality: Right;   LUMBAR FUSION  2002   L3, 4 L4, 5 L5 S1 Fused by Dr. Glenna Fellows   PERIPHERAL VASCULAR BALLOON ANGIOPLASTY Left 09/15/2017   Procedure: [place];  Surgeon: Serafina Mitchell, MD;  Location: [place] [facility];  Service: Cardiovascular;  Laterality: Left;  PTA of Peroneal & Posterior Tibial   PERIPHERAL VASCULAR INTERVENTION Right 12/15/2020   Procedure: PERIPHERAL VASCULAR INTERVENTION;  Surgeon: Serafina Mitchell, MD;  Location: [place] [facility];  Service: Cardiovascular;  Laterality: Right;  SFA   POLYPECTOMY  11/28/2018   Procedure: POLYPECTOMY;  Surgeon: Rogene Houston, MD;  [facility];  Service: Endoscopy;;  duodenum   POSTERIOR LUMBAR FUSION     RHINOPLASTY     RIGHT HEART CATH N/A 05/18/2017   Procedure: RIGHT HEART CATH;  Surgeon: Jettie Booze, MD;  Location: [place] [facility];  Service: Cardiovascular;  Laterality: N/A;   TEE WITHOUT CARDIOVERSION N/A 09/18/2017   Procedure: TRANSESOPHAGEAL ECHOCARDIOGRAM (TEE);  Surgeon: Fay Records, MD;  Location: [place];  Service: Cardiovascular;  Laterality: N/A;   TOTAL KNEE ARTHROPLASTY Bilateral    TRANSMETATARSAL AMPUTATION Right 02/04/2020   Procedure: RIGHT TRANSMETATARSAL  AMPUTATION;  Surgeon: Serafina Mitchell, MD;  Location: [facility];  Service: Vascular;  Laterality: Right;   TRANSURETHRAL RESECTION OF PROSTATE  2001   [place]     Allergies  Allergen Reactions   Codeine Shortness Of Breath   Doxycycline Swelling    Swelling and numbness in lips and face. Swelling improved after stopping. Reports still experiences numbness in bottom lip.    Feraheme [Ferumoxytol] Other (See Comments)    Diaphoretic, chest pain   Heparin Other (See Comments)    +HIT,  Severe bleeding (with heparin drip & large doses),  tolerates low doses   Losartan Swelling   Oxycodone Other (See Comments)    "Made me act out of my mind" Mental status changes- hallucinations   Latex Rash      Family History  Problem Relation Age of Onset   Heart attack Father 53   COPD Father    COPD Mother    Heart disease Mother    Diabetes Mother    Hypertension Sister    CVA Sister    Diabetes Sister    Multiple sclerosis Sister      Social History Mr. Solorio reports that he quit smoking about 38 years ago. His smoking use included cigarettes. He started smoking about 68 years ago. He has a 60.00 pack-year smoking history. He has never used smokeless tobacco. Mr. Canupp reports that he does not currently use alcohol.   Review of Systems CONSTITUTIONAL: No weight loss, fever, chills, weakness or fatigue.  HEENT: Eyes: No visual loss, blurred vision, double vision or yellow sclerae.No hearing loss, sneezing, congestion, runny nose or sore throat.  SKIN: No rash or itching.  CARDIOVASCULAR: per hpi RESPIRATORY: No shortness of breath, cough or sputum.  GASTROINTESTINAL: No anorexia, nausea, vomiting or diarrhea. No abdominal pain or blood.  GENITOURINARY: No burning on urination, no polyuria NEUROLOGICAL: No headache, dizziness, syncope, paralysis, ataxia, numbness or tingling in the extremities. No change in bowel or bladder control.  MUSCULOSKELETAL: No muscle, back pain, joint pain or stiffness.  LYMPHATICS: No enlarged nodes. No history of splenectomy.  PSYCHIATRIC: No history of depression or anxiety.  ENDOCRINOLOGIC: No reports of sweating, cold or heat intolerance. No polyuria or polydipsia.  [name]   Physical Examination Today's Vitals   10/26/21 1400  BP: 122/60  Pulse: 74  SpO2: 98%  Weight: 181 lb 6.4 oz (82.3 kg)  Height: _0  (1.702 m)   Body mass index is 28.41 kg/m.  Gen: resting comfortably, no acute distress HEENT: no scleral icterus, pupils equal round and reactive, no palptable cervical  adenopathy,  CV: irreg, no m/r/g, n ojvd Resp: Clear to auscultation bilaterally GI: abdomen is soft, non-tender, non-distended, normal bowel sounds, no hepatosplenomegaly MSK: extremities are warm, no edema.  Skin: warm, no rash Neuro:  no focal deficits Psych: appropriate affect   Diagnostic Studies  05/2015 echo Study Conclusions   - Left ventricle: The cavity size was normal. Wall thickness was   increased increased in a pattern of mild to moderate LVH.   Systolic function was vigorous. The estimated ejection fraction   was in the range of 65% to 70%. Wall motion was normal; there   were no regional wall motion abnormalities. The study is not   technically sufficient to allow evaluation of LV diastolic   function. - Aortic valve: Moderately calcified annulus. Mildly thickened   leaflets. There was mild stenosis. There was mild regurgitation.   Mean gradient (S): 13 mm Hg. Valve area (  VTI): 1.51 cm^2. - Mitral valve: Mildly calcified annulus. Normal thickness leaflets   . - Left atrium: The atrium was moderately dilated. - Technically adequate study.     06/2015 Nuclear stress test No diagnostic ST segment changes to indicate ischemia. Small, mild intensity, perfusion defects noted in the apical anterior and basal inferolateral walls. This is most consistent with soft tissue attenuation given normal wall motion in these regions. No large ischemic zones noted. This is a low risk study. Nuclear stress EF: 64%.   05/2016 Event monitor Rhythm is atrial fibrillation throughout study. Occasioanal PVCs Min HR 51, Max HR 107, Avg HR 67 Reported symptoms correspond with rate controlled atrial fibrillation.   09/2016 nuclear stress There was no ST segment deviation noted during stress. The study is normal. There are no perfusion defects consistent with prior infarct or current ischemia. This is a low risk study. The left ventricular ejection fraction is normal (55-65%).      04/2017 cath Dist RCA lesion is 40% stenosed. Prox RCA lesion is 25% stenosed. 1st Diag lesion is 75% stenosed. A drug-eluting stent was successfully placed using a STENT SYNERGY DES 2.25X12. Post intervention, there is a 0% residual stenosis. Prox LAD to Mid LAD lesion is 75% stenosed. A drug-eluting stent was successfully placed using a STENT SYNERGY DES 3.5X38. Post intervention, there is a 0% residual stenosis. Prox LAD lesion is 70% stenosed. A drug-eluting stent was successfully placed using a STENT SYNERGY DES 4X12, overlapping the prior stent. Post intervention, there is a 0% residual stenosis. The left ventricular systolic function is normal. The left ventricular ejection fraction is 55-65% by visual estimate. LV end diastolic pressure is normal. There is no aortic valve stenosis. Ao sat 98%, PA sat 65%; PA mean 25 mm Hg; unable to wedge the catheter. Normal right heat pressures. Tortuous right subclavian making catheter navigation difficult from the right radial approach.   Continue dual antiplatelet therapy along with Eliquis for 30 days.   Restart Eliquis tomorrow.   After 30 days, stop aspirin.  Continue Plavix and Eliquis.     After 6 months, can consider stopping clopidogrel if he has bleeding issues.  Given the nomber of stents, would try to complete one year of clopidogrel if no bleeding issues.    01/2020 nuclear stress Small, mild intensity, mid to basal inferior/inferoseptal defect that is fixed and consistent with soft tissue attenuation in light of normal wall motion. No significant ischemic territories. This is a low risk study. Nuclear stress EF: 63%.   Assessment and Plan  1.CAD - chronic chest pains unchanged, do improve with SL NG. Will increase his imdur to 30m daily.  - recent stress test without significant ischemia - we had continued prolonged eliquis and plavix due to stent burden. - continue current meds   2. Chronic diastolic  HF -euvolemic, continue lasix at current dosing.    3. Afib/acquired thrombophliia - off av nodal agents due to bradycardia. - no symptoms, continue current meds      JArnoldo Lenis M.D.

## 2021-11-02 DIAGNOSIS — L97521 Non-pressure chronic ulcer of other part of left foot limited to breakdown of skin: Secondary | ICD-10-CM | POA: Diagnosis not present

## 2021-11-02 DIAGNOSIS — Z299 Encounter for prophylactic measures, unspecified: Secondary | ICD-10-CM | POA: Diagnosis not present

## 2021-11-02 DIAGNOSIS — R2681 Unsteadiness on feet: Secondary | ICD-10-CM | POA: Diagnosis not present

## 2021-11-02 DIAGNOSIS — J9611 Chronic respiratory failure with hypoxia: Secondary | ICD-10-CM | POA: Diagnosis not present

## 2021-11-02 DIAGNOSIS — L03116 Cellulitis of left lower limb: Secondary | ICD-10-CM | POA: Diagnosis not present

## 2021-11-03 ENCOUNTER — Other Ambulatory Visit: Payer: Self-pay

## 2021-11-03 NOTE — Patient Outreach (Signed)
[facility]) Care Management  11/03/2021  KERRON SEDANO 1933/08/05 [id number]   Patient meeting goals for heart failure. Closing case.  Care Plan : RN Care Manager Plan of Care  Updates made by Jon Billings, RN since 11/03/2021 12:00 AM  Completed 11/03/2021   Problem: Chronic Disease Management and Care Coordination Needs for heart failure Resolved 11/03/2021  Priority: High     Long-Range Goal: Development of plan of care for management of heart failure Completed 11/03/2021  Start Date: 02/17/2021  Expected End Date: 03/19/2022  Recent Progress: On track  Priority: High  Note:   Current Barriers:  Chronic Disease Management support and education needs related to CHF   RNCM Clinical Goal(s):  Patient will verbalize basic understanding of  CHF disease process and self health management plan as evidenced by No exacerbation of heart failure continue to work with RN Care Manager to address care management and care coordination needs related to  CHF as evidenced by adherence to CM Team Scheduled appointments through collaboration with RN Care manager, provider, and care team.   Interventions: Education and support related to heart failure management. Inter-disciplinary care team collaboration (see longitudinal plan of care) Evaluation of current treatment plan related to  self management and patient's adherence to plan as established by provider   Heart Failure Interventions:  (Status:  Goal on track:  Yes.) Long Term Goal Provided education on low sodium diet Discussed importance of daily weight and advised patient to weigh and record daily Discussed the importance of keeping all appointments with provider  06/21/21  Patient continues to weigh daily.  Last weight 178 lbs.  Patient states he stays between 177 lbs and 178 lbs.  Encouraged continued heart failure management.      08/19/21 Patient doing well. Weight today 176 lbs.  Discussed continued heart failure  management.  No concerns.    09/10/21 Patient doing well.  Weight between 174-177 lbs.  Encouraged continued heart failure management.    No concerns.     Patient Goals/Self-Care Activities: Heart Failure Take all medications as prescribed Attend all scheduled provider appointments call office if I gain more than 2 pounds in one day or 5 pounds in one week track weight in diary use salt in moderation watch for swelling in feet, ankles and legs every day weigh myself daily follow rescue plan if symptoms flare-up eat more whole grains, fruits and vegetables, lean meats and healthy fats  Follow Up Plan:  Patient meeting goals.  Closing case.    Plan: Closing case.    [name], RN, MSN [facility] Care Management Care Management Coordinator Direct Line [phone] Toll Free: [phone]  Fax: [phone]

## 2021-11-09 DIAGNOSIS — Z87891 Personal history of nicotine dependence: Secondary | ICD-10-CM | POA: Diagnosis not present

## 2021-11-09 DIAGNOSIS — I10 Essential (primary) hypertension: Secondary | ICD-10-CM | POA: Diagnosis not present

## 2021-11-09 DIAGNOSIS — K141 Geographic tongue: Secondary | ICD-10-CM | POA: Diagnosis not present

## 2021-11-09 DIAGNOSIS — Z299 Encounter for prophylactic measures, unspecified: Secondary | ICD-10-CM | POA: Diagnosis not present

## 2021-11-09 DIAGNOSIS — L97521 Non-pressure chronic ulcer of other part of left foot limited to breakdown of skin: Secondary | ICD-10-CM | POA: Diagnosis not present

## 2021-11-09 DIAGNOSIS — E1129 Type 2 diabetes mellitus with other diabetic kidney complication: Secondary | ICD-10-CM | POA: Diagnosis not present

## 2021-11-09 DIAGNOSIS — J9611 Chronic respiratory failure with hypoxia: Secondary | ICD-10-CM | POA: Diagnosis not present

## 2021-11-10 ENCOUNTER — Emergency Department (HOSPITAL_BASED_OUTPATIENT_CLINIC_OR_DEPARTMENT_OTHER): Payer: Medicare HMO

## 2021-11-10 ENCOUNTER — Other Ambulatory Visit: Payer: Self-pay

## 2021-11-10 ENCOUNTER — Inpatient Hospital Stay (HOSPITAL_COMMUNITY)
Admission: EM | Admit: 2021-11-10 | Discharge: 2021-11-16 | DRG: 240 | Disposition: A | Discharge status: Home Health | Payer: Medicare HMO | Attending: Family Medicine | Admitting: Family Medicine

## 2021-11-10 ENCOUNTER — Emergency Department (HOSPITAL_COMMUNITY): Payer: Medicare HMO

## 2021-11-10 ENCOUNTER — Telehealth: Payer: Self-pay | Admitting: *Deleted

## 2021-11-10 DIAGNOSIS — Z6826 Body mass index (BMI) 26.0-26.9, adult: Secondary | ICD-10-CM

## 2021-11-10 DIAGNOSIS — L97529 Non-pressure chronic ulcer of other part of left foot with unspecified severity: Secondary | ICD-10-CM | POA: Diagnosis not present

## 2021-11-10 DIAGNOSIS — Z87891 Personal history of nicotine dependence: Secondary | ICD-10-CM | POA: Diagnosis not present

## 2021-11-10 DIAGNOSIS — E782 Mixed hyperlipidemia: Secondary | ICD-10-CM | POA: Diagnosis present

## 2021-11-10 DIAGNOSIS — E11621 Type 2 diabetes mellitus with foot ulcer: Secondary | ICD-10-CM | POA: Diagnosis present

## 2021-11-10 DIAGNOSIS — Z820 Family history of epilepsy and other diseases of the nervous system: Secondary | ICD-10-CM

## 2021-11-10 DIAGNOSIS — Z833 Family history of diabetes mellitus: Secondary | ICD-10-CM

## 2021-11-10 DIAGNOSIS — I10 Essential (primary) hypertension: Secondary | ICD-10-CM | POA: Diagnosis not present

## 2021-11-10 DIAGNOSIS — I480 Paroxysmal atrial fibrillation: Secondary | ICD-10-CM | POA: Diagnosis present

## 2021-11-10 DIAGNOSIS — Z89411 Acquired absence of right great toe: Secondary | ICD-10-CM | POA: Diagnosis not present

## 2021-11-10 DIAGNOSIS — N1832 Chronic kidney disease, stage 3b: Secondary | ICD-10-CM | POA: Diagnosis present

## 2021-11-10 DIAGNOSIS — I509 Heart failure, unspecified: Secondary | ICD-10-CM | POA: Diagnosis not present

## 2021-11-10 DIAGNOSIS — M869 Osteomyelitis, unspecified: Secondary | ICD-10-CM

## 2021-11-10 DIAGNOSIS — I739 Peripheral vascular disease, unspecified: Secondary | ICD-10-CM | POA: Diagnosis not present

## 2021-11-10 DIAGNOSIS — I96 Gangrene, not elsewhere classified: Secondary | ICD-10-CM

## 2021-11-10 DIAGNOSIS — Z8249 Family history of ischemic heart disease and other diseases of the circulatory system: Secondary | ICD-10-CM

## 2021-11-10 DIAGNOSIS — M7989 Other specified soft tissue disorders: Secondary | ICD-10-CM | POA: Diagnosis not present

## 2021-11-10 DIAGNOSIS — E11628 Type 2 diabetes mellitus with other skin complications: Secondary | ICD-10-CM | POA: Diagnosis present

## 2021-11-10 DIAGNOSIS — J449 Chronic obstructive pulmonary disease, unspecified: Secondary | ICD-10-CM | POA: Diagnosis present

## 2021-11-10 DIAGNOSIS — K219 Gastro-esophageal reflux disease without esophagitis: Secondary | ICD-10-CM | POA: Diagnosis present

## 2021-11-10 DIAGNOSIS — E119 Type 2 diabetes mellitus without complications: Secondary | ICD-10-CM

## 2021-11-10 DIAGNOSIS — E1152 Type 2 diabetes mellitus with diabetic peripheral angiopathy with gangrene: Principal | ICD-10-CM

## 2021-11-10 DIAGNOSIS — E039 Hypothyroidism, unspecified: Secondary | ICD-10-CM | POA: Diagnosis present

## 2021-11-10 DIAGNOSIS — D638 Anemia in other chronic diseases classified elsewhere: Secondary | ICD-10-CM | POA: Diagnosis not present

## 2021-11-10 DIAGNOSIS — Z794 Long term (current) use of insulin: Secondary | ICD-10-CM

## 2021-11-10 DIAGNOSIS — S91302A Unspecified open wound, left foot, initial encounter: Secondary | ICD-10-CM | POA: Diagnosis not present

## 2021-11-10 DIAGNOSIS — Z8614 Personal history of Methicillin resistant Staphylococcus aureus infection: Secondary | ICD-10-CM

## 2021-11-10 DIAGNOSIS — M7732 Calcaneal spur, left foot: Secondary | ICD-10-CM | POA: Diagnosis not present

## 2021-11-10 DIAGNOSIS — E1169 Type 2 diabetes mellitus with other specified complication: Secondary | ICD-10-CM | POA: Diagnosis present

## 2021-11-10 DIAGNOSIS — Z7984 Long term (current) use of oral hypoglycemic drugs: Secondary | ICD-10-CM | POA: Diagnosis not present

## 2021-11-10 DIAGNOSIS — Z79899 Other long term (current) drug therapy: Secondary | ICD-10-CM

## 2021-11-10 DIAGNOSIS — T797XXA Traumatic subcutaneous emphysema, initial encounter: Secondary | ICD-10-CM | POA: Diagnosis not present

## 2021-11-10 DIAGNOSIS — Z89429 Acquired absence of other toe(s), unspecified side: Secondary | ICD-10-CM

## 2021-11-10 DIAGNOSIS — M609 Myositis, unspecified: Secondary | ICD-10-CM | POA: Diagnosis not present

## 2021-11-10 DIAGNOSIS — D509 Iron deficiency anemia, unspecified: Secondary | ICD-10-CM | POA: Diagnosis present

## 2021-11-10 DIAGNOSIS — E1122 Type 2 diabetes mellitus with diabetic chronic kidney disease: Secondary | ICD-10-CM | POA: Diagnosis present

## 2021-11-10 DIAGNOSIS — N189 Chronic kidney disease, unspecified: Secondary | ICD-10-CM

## 2021-11-10 DIAGNOSIS — Z7901 Long term (current) use of anticoagulants: Secondary | ICD-10-CM

## 2021-11-10 DIAGNOSIS — E440 Moderate protein-calorie malnutrition: Secondary | ICD-10-CM | POA: Diagnosis not present

## 2021-11-10 DIAGNOSIS — I5032 Chronic diastolic (congestive) heart failure: Secondary | ICD-10-CM | POA: Diagnosis present

## 2021-11-10 DIAGNOSIS — I130 Hypertensive heart and chronic kidney disease with heart failure and stage 1 through stage 4 chronic kidney disease, or unspecified chronic kidney disease: Secondary | ICD-10-CM | POA: Diagnosis present

## 2021-11-10 DIAGNOSIS — E1151 Type 2 diabetes mellitus with diabetic peripheral angiopathy without gangrene: Secondary | ICD-10-CM | POA: Diagnosis not present

## 2021-11-10 DIAGNOSIS — L03116 Cellulitis of left lower limb: Secondary | ICD-10-CM | POA: Diagnosis not present

## 2021-11-10 DIAGNOSIS — N179 Acute kidney failure, unspecified: Secondary | ICD-10-CM | POA: Diagnosis present

## 2021-11-10 DIAGNOSIS — E1140 Type 2 diabetes mellitus with diabetic neuropathy, unspecified: Secondary | ICD-10-CM | POA: Diagnosis present

## 2021-11-10 DIAGNOSIS — Z955 Presence of coronary angioplasty implant and graft: Secondary | ICD-10-CM

## 2021-11-10 DIAGNOSIS — I70222 Atherosclerosis of native arteries of extremities with rest pain, left leg: Secondary | ICD-10-CM | POA: Diagnosis not present

## 2021-11-10 DIAGNOSIS — I110 Hypertensive heart disease with heart failure: Secondary | ICD-10-CM | POA: Diagnosis not present

## 2021-11-10 DIAGNOSIS — Z9981 Dependence on supplemental oxygen: Secondary | ICD-10-CM

## 2021-11-10 DIAGNOSIS — Z9861 Coronary angioplasty status: Secondary | ICD-10-CM | POA: Diagnosis not present

## 2021-11-10 DIAGNOSIS — R079 Chest pain, unspecified: Secondary | ICD-10-CM | POA: Diagnosis not present

## 2021-11-10 DIAGNOSIS — Z823 Family history of stroke: Secondary | ICD-10-CM

## 2021-11-10 DIAGNOSIS — Z89412 Acquired absence of left great toe: Secondary | ICD-10-CM | POA: Diagnosis not present

## 2021-11-10 DIAGNOSIS — S91309A Unspecified open wound, unspecified foot, initial encounter: Secondary | ICD-10-CM

## 2021-11-10 DIAGNOSIS — M86172 Other acute osteomyelitis, left ankle and foot: Secondary | ICD-10-CM | POA: Diagnosis not present

## 2021-11-10 DIAGNOSIS — Z825 Family history of asthma and other chronic lower respiratory diseases: Secondary | ICD-10-CM

## 2021-11-10 DIAGNOSIS — I2510 Atherosclerotic heart disease of native coronary artery without angina pectoris: Secondary | ICD-10-CM

## 2021-11-10 LAB — CBC
HCT: 24.7 % — ABNORMAL LOW (ref 39.0–52.0)
HCT: 24.4 % — ABNORMAL LOW (ref 39.0–52.0)
Hemoglobin: 7.7 g/dL — ABNORMAL LOW (ref 13.0–17.0)
Hemoglobin: 7.6 g/dL — ABNORMAL LOW (ref 13.0–17.0)
MCH: 21.4 pg — ABNORMAL LOW (ref 26.0–34.0)
MCH: 21.7 pg — ABNORMAL LOW (ref 26.0–34.0)
MCHC: 31.2 g/dL (ref 30.0–36.0)
MCHC: 31.1 g/dL (ref 30.0–36.0)
MCV: 68.6 fL — ABNORMAL LOW (ref 80.0–100.0)
MCV: 69.7 fL — ABNORMAL LOW (ref 80.0–100.0)
Platelets: 441 10*3/uL — ABNORMAL HIGH (ref 150–400)
Platelets: 396 10*3/uL (ref 150–400)
RBC: 3.6 MIL/uL — ABNORMAL LOW (ref 4.22–5.81)
RBC: 3.5 MIL/uL — ABNORMAL LOW (ref 4.22–5.81)
RDW: 20.4 % — ABNORMAL HIGH (ref 11.5–15.5)
RDW: 20.4 % — ABNORMAL HIGH (ref 11.5–15.5)
WBC: 14.4 10*3/uL — ABNORMAL HIGH (ref 4.0–10.5)
WBC: 12.1 10*3/uL — ABNORMAL HIGH (ref 4.0–10.5)
nRBC: 0 % (ref 0.0–0.2)
nRBC: 0 % (ref 0.0–0.2)

## 2021-11-10 LAB — C-REACTIVE PROTEIN: CRP: 8.5 mg/dL — ABNORMAL HIGH (ref <1.0)

## 2021-11-10 LAB — BASIC METABOLIC PANEL
Anion gap: 10 (ref 5–15)
BUN: 25 mg/dL — ABNORMAL HIGH (ref 8–23)
CO2: 25 mmol/L (ref 22–32)
Calcium: 8.5 mg/dL — ABNORMAL LOW (ref 8.9–10.3)
Chloride: 100 mmol/L (ref 98–111)
Creatinine, Ser: 2.63 mg/dL — ABNORMAL HIGH (ref 0.61–1.24)
GFR, Estimated: 23 mL/min — ABNORMAL LOW (ref >60)
Glucose, Bld: 110 mg/dL — ABNORMAL HIGH (ref 70–99)
Potassium: 4.9 mmol/L (ref 3.5–5.1)
Sodium: 135 mmol/L (ref 135–145)

## 2021-11-10 LAB — SEDIMENTATION RATE: Sed Rate: 85 mm/hr — ABNORMAL HIGH (ref 0–16)

## 2021-11-10 LAB — DIFFERENTIAL
Abs Immature Granulocytes: 0.52 10*3/uL — ABNORMAL HIGH (ref 0.00–0.07)
Basophils Absolute: 0.1 10*3/uL (ref 0.0–0.1)
Basophils Relative: 0 %
Eosinophils Absolute: 0.1 10*3/uL (ref 0.0–0.5)
Eosinophils Relative: 0 %
Immature Granulocytes: 4 %
Lymphocytes Relative: 9 %
Lymphs Abs: 1.1 10*3/uL (ref 0.7–4.0)
Monocytes Absolute: 2.2 10*3/uL — ABNORMAL HIGH (ref 0.1–1.0)
Monocytes Relative: 18 %
Neutro Abs: 8.2 10*3/uL — ABNORMAL HIGH (ref 1.7–7.7)
Neutrophils Relative %: 69 %

## 2021-11-10 LAB — PREALBUMIN: Prealbumin: 11 mg/dL — ABNORMAL LOW (ref 18–38)

## 2021-11-10 LAB — LACTIC ACID, PLASMA
Lactic Acid, Venous: 1.5 mmol/L (ref 0.5–1.9)
Lactic Acid, Venous: 1.1 mmol/L (ref 0.5–1.9)

## 2021-11-10 LAB — CBG MONITORING, ED
Glucose-Capillary: 154 mg/dL — ABNORMAL HIGH (ref 70–99)
Glucose-Capillary: 72 mg/dL (ref 70–99)

## 2021-11-10 MED ORDER — LINEZOLID 600 MG PO TABS: 600.0000 mg | ORAL_TABLET | Freq: Two times a day (BID) | ORAL | Status: DC

## 2021-11-10 MED ORDER — ATORVASTATIN CALCIUM 80 MG PO TABS
80.0000 mg | ORAL_TABLET | Freq: Every evening | ORAL | Status: DC
  Administered 2021-11-10 – 2021-11-15 (×6): 80 mg via ORAL
  Filled 2021-11-10 (×6): qty 1

## 2021-11-10 MED ORDER — ONDANSETRON HCL 4 MG/2ML IJ SOLN
4.0000 mg | Freq: Four times a day (QID) | INTRAMUSCULAR | Status: DC | PRN
  Administered 2021-11-12: 4 mg via INTRAVENOUS
  Filled 2021-11-10 (×2): qty 2

## 2021-11-10 MED ORDER — ACETAMINOPHEN 325 MG PO TABS
650.0000 mg | ORAL_TABLET | Freq: Four times a day (QID) | ORAL | Status: DC | PRN
  Administered 2021-11-13: 650 mg via ORAL
  Filled 2021-11-10: qty 2

## 2021-11-10 MED ORDER — ISOSORBIDE MONONITRATE ER 60 MG PO TB24
60.0000 mg | ORAL_TABLET | Freq: Two times a day (BID) | ORAL | Status: DC
  Administered 2021-11-10 – 2021-11-16 (×12): 60 mg via ORAL
  Filled 2021-11-10 (×2): qty 1
  Filled 2021-11-10: qty 2
  Filled 2021-11-10 (×2): qty 1
  Filled 2021-11-10: qty 2
  Filled 2021-11-10 (×6): qty 1

## 2021-11-10 MED ORDER — INSULIN ASPART 100 UNIT/ML IJ SOLN
0.0000 [IU] | Freq: Every day | INTRAMUSCULAR | Status: DC
  Administered 2021-11-12: 4 [IU] via SUBCUTANEOUS

## 2021-11-10 MED ORDER — VANCOMYCIN HCL 1500 MG/300ML IV SOLN
1500.0000 mg | Freq: Once | INTRAVENOUS | Status: DC
  Filled 2021-11-10: qty 300

## 2021-11-10 MED ORDER — METRONIDAZOLE 500 MG PO TABS
500.0000 mg | ORAL_TABLET | Freq: Two times a day (BID) | ORAL | Status: DC
  Administered 2021-11-10 – 2021-11-16 (×12): 500 mg via ORAL
  Filled 2021-11-10 (×12): qty 1

## 2021-11-10 MED ORDER — ONDANSETRON HCL 4 MG PO TABS: 4.0000 mg | ORAL_TABLET | Freq: Four times a day (QID) | ORAL | Status: DC | PRN

## 2021-11-10 MED ORDER — ACETAMINOPHEN 650 MG RE SUPP: 650.0000 mg | Freq: Four times a day (QID) | RECTAL | Status: DC | PRN

## 2021-11-10 MED ORDER — VANCOMYCIN HCL 1250 MG/250ML IV SOLN: 1250.0000 mg | INTRAVENOUS | Status: DC

## 2021-11-10 MED ORDER — SODIUM CHLORIDE 0.9 % IV SOLN
2.0000 g | INTRAVENOUS | Status: DC
  Administered 2021-11-10 – 2021-11-15 (×6): 2 g via INTRAVENOUS
  Filled 2021-11-10 (×6): qty 20

## 2021-11-10 MED ORDER — LINEZOLID 600 MG/300ML IV SOLN
600.0000 mg | Freq: Two times a day (BID) | INTRAVENOUS | Status: DC
  Administered 2021-11-10 – 2021-11-14 (×8): 600 mg via INTRAVENOUS
  Filled 2021-11-10 (×8): qty 300

## 2021-11-10 MED ORDER — INSULIN GLARGINE-YFGN 100 UNIT/ML ~~LOC~~ SOLN
20.0000 [IU] | Freq: Two times a day (BID) | SUBCUTANEOUS | Status: DC
  Administered 2021-11-10 – 2021-11-16 (×12): 20 [IU] via SUBCUTANEOUS
  Filled 2021-11-10 (×13): qty 0.2

## 2021-11-10 MED ORDER — INSULIN ASPART 100 UNIT/ML IJ SOLN
0.0000 [IU] | Freq: Three times a day (TID) | INTRAMUSCULAR | Status: DC
  Administered 2021-11-11: 2 [IU] via SUBCUTANEOUS
  Administered 2021-11-12: 3 [IU] via SUBCUTANEOUS
  Administered 2021-11-13: 8 [IU] via SUBCUTANEOUS
  Administered 2021-11-13: 5 [IU] via SUBCUTANEOUS
  Administered 2021-11-13 – 2021-11-14 (×2): 3 [IU] via SUBCUTANEOUS
  Administered 2021-11-15 (×2): 2 [IU] via SUBCUTANEOUS
  Administered 2021-11-16: 3 [IU] via SUBCUTANEOUS

## 2021-11-10 NOTE — H&P (Signed)
History and Physical    Patient: Mike Wright ZLD:[id number] DOB: [birth year] DOA: 11/10/2021 DOS: the patient was seen and examined on 11/10/2021 PCP: Monico Blitz, MD  Patient coming from: Home  Chief Complaint:  Chief Complaint  Patient presents with   Wound Infection   HPI: Mike Wright is a 88 y.o. male with medical history significant of DM2, PAD, CAD, A.Fib on eliquis.  Prior MRSA infections, prior amputation of toes.  Presenting with worsening wound of the left foot that began 2 weeks ago.  Pain rated 2/10.  Patient concerned of gangrene.  Infection appears to be spreading from the second toe to the surrounding area.  Also complaining of foul odor from the wound.  Denies fevers or chills.  Denies shortness of breath or chest pain.  Decreased sensation of bilateral feet at baseline. Has been on bactrim for 10 days.   Seen by Dr. Trula Slade in ED waiting room, (see his note for details).  Review of Systems: As mentioned in the history of present illness. All other systems reviewed and are negative. Past Medical History:  Diagnosis Date   Anemia    a. mild, noted 04/2017.   CAD in native artery    a. [name] 04/2017 s/p DES to D1, DES to prox-mid LAD, DES to prox LAD overlapping the prior stent, LVEF 55-65%.    Chronic diastolic CHF (congestive heart failure) (HCC)    Constipation    COPD (chronic obstructive pulmonary disease) (HCC)    Diabetic ulcer of toe (HCC)    DJD (degenerative joint disease) of cervical spine    Dysrhythmia    AFib   Essential hypertension    GERD (gastroesophageal reflux disease)    History of hiatal hernia    HIT (heparin-induced thrombocytopenia) ([place])    Hypothyroidism    Hypoxia    a. went home on home O2 04/2017.   Insomnia    Mixed hyperlipidemia    PAD (peripheral artery disease) (HCC)    PAF (paroxysmal atrial fibrillation) (HCC)    PVD (peripheral vascular disease) (HCC)    Renal insufficiency    Retinal hemorrhage    lost 90% of  vision.   Retinitis    Sinus bradycardia    a. HR 30s-40s in 04/2017 -> diltiazem stopped, metoprolol reduced.   Sleep apnea    "chose not to order CPAP at this time" (05/18/2017)   Type 2 diabetes mellitus ([place])    Wears glasses    Wheezing    a. suspected COPD 04/2017. Former tobacco x 40 years.   Past Surgical History:  Procedure Laterality Date   ABDOMINAL AORTOGRAM W/LOWER EXTREMITY N/A 09/15/2017   Procedure: ABDOMINAL AORTOGRAM W/LOWER EXTREMITY;  Surgeon: Serafina Mitchell, MD;  Location: [place] [facility];  Service: Cardiovascular;  Laterality: N/A;   ABDOMINAL AORTOGRAM W/LOWER EXTREMITY N/A 02/04/2020   Procedure: ABDOMINAL AORTOGRAM W/LOWER EXTREMITY;  Surgeon: Serafina Mitchell, MD;  Location: [place] [facility];  Service: Cardiovascular;  Laterality: N/A;   ABDOMINAL AORTOGRAM W/LOWER EXTREMITY N/A 12/15/2020   Procedure: ABDOMINAL AORTOGRAM W/LOWER EXTREMITY;  Surgeon: Serafina Mitchell, MD;  Location: [place] [facility];  Service: Cardiovascular;  Laterality: N/A;   AMPUTATION Bilateral 09/20/2017   Procedure: BILATERAL GREAT TOE AMPUTATIONS INCLUDING METATARSAL HEADS;  Surgeon: Serafina Mitchell, MD;  Location: [place];  Service: Vascular;  Laterality: Bilateral;   AMPUTATION Right 03/01/2019   Procedure: AMPUTATION TOES;  Surgeon: Angelia Mould, MD;  Location: [place];  Service: Vascular;  Laterality: Right;   AMPUTATION Right 05/29/2019   Procedure: AMPUTATION RIGHT THIRD TOE;  Surgeon: Serafina Mitchell, MD;  Location: [place];  Service: Vascular;  Laterality: Right;   AMPUTATION Right 09/26/2019   Procedure: AMPUTATION RIGHT FOURTH TOE AND FIFTH TOES;  Surgeon: Serafina Mitchell, MD;  Location: [place];  Service: Vascular;  Laterality: Right;   ANGIOPLASTY Right 09/20/2017   Procedure: ANGIOPLASTY RIGHT TIBIAL ARTERY;  Surgeon: Serafina Mitchell, MD;  Location: [place];  Service: Vascular;  Laterality: Right;   APPENDECTOMY     APPLICATION OF WOUND VAC  12/17/2020   Procedure:  APPLICATION OF WOUND VAC;  Surgeon: Serafina Mitchell, MD;  Location: [place];  Service: Vascular;;   [place]; 2012;   CATARACT EXTRACTION Left 2004   COLONOSCOPY  2004   FLEISHMAN TICS   COLONOSCOPY WITH PROPOFOL N/A 11/28/2018   Procedure: COLONOSCOPY WITH PROPOFOL;  Surgeon: Rogene Houston, MD;  [facility];  Service: Endoscopy;  Laterality: N/A;   CORONARY ANGIOPLASTY WITH STENT PLACEMENT  05/18/2017   "3 stents"   CORONARY STENT INTERVENTION N/A 05/18/2017   Procedure: CORONARY STENT INTERVENTION;  Surgeon: Jettie Booze, MD;  Location: [place] [facility];  Service: Cardiovascular;  Laterality: N/A;   ESOPHAGOGASTRODUODENOSCOPY (EGD) WITH PROPOFOL N/A 11/20/2017   Procedure: ESOPHAGOGASTRODUODENOSCOPY (EGD) WITH PROPOFOL;  Surgeon: Irene Shipper, MD;  Location: [place];  Service: Gastroenterology;  Laterality: N/A;   ESOPHAGOGASTRODUODENOSCOPY (EGD) WITH PROPOFOL N/A 11/28/2018   Procedure: ESOPHAGOGASTRODUODENOSCOPY (EGD) WITH PROPOFOL;  Surgeon: Rogene Houston, MD;  [facility];  Service: Endoscopy;  Laterality: N/A;   ESOPHAGOGASTRODUODENOSCOPY (EGD) WITH PROPOFOL N/A 07/12/2019   Procedure: ESOPHAGOGASTRODUODENOSCOPY (EGD) WITH PROPOFOL;  Surgeon: Rogene Houston, MD;  [facility];  Service: Endoscopy;  Laterality: N/A;  [place] N/A 12/17/2018   Procedure: GIVENS CAPSULE STUDY;  Surgeon: Rogene Houston, MD;  [facility];  Service: Endoscopy;  Laterality: N/A;  7:30am   I & D EXTREMITY Right 12/17/2017   Procedure: IRRIGATION AND DEBRIDEMENT RIGHT GREAT TOE;  Surgeon: Serafina Mitchell, MD;  [facility];  Service: Vascular;  Laterality: Right;   INCISION AND DRAINAGE Right 12/17/2020   Procedure: INCISION AND DRAINAGE OF RIGHT FOOT;  Surgeon: Serafina Mitchell, MD;  [facility];  Service: Vascular;  Laterality: Right;   JOINT REPLACEMENT     LEFT HEART CATH AND  CORONARY ANGIOGRAPHY N/A 05/18/2017   Procedure: LEFT HEART CATH AND CORONARY ANGIOGRAPHY;  Surgeon: Jettie Booze, MD;  Location: [facility];  Service: Cardiovascular;  Laterality: N/A;   LOWER EXTREMITY ANGIOGRAM Right 09/20/2017   Procedure: RIGHT LOWER LEG  ANGIOGRAM;  Surgeon: Serafina Mitchell, MD;  [facility];  Service: Vascular;  Laterality: Right;   LUMBAR FUSION  2002   L3, 4 L4, 5 L5 S1 Fused by Dr. Glenna Fellows   PERIPHERAL VASCULAR BALLOON ANGIOPLASTY Left 09/15/2017   Procedure: [place];  Surgeon: Serafina Mitchell, MD;  Location: [place] [facility];  Service: Cardiovascular;  Laterality: Left;  PTA of Peroneal & Posterior Tibial   PERIPHERAL VASCULAR INTERVENTION Right 12/15/2020   Procedure: PERIPHERAL VASCULAR INTERVENTION;  Surgeon: Serafina Mitchell, MD;  Location: [place] [place] [facility];  Service: Cardiovascular;  Laterality: Right;  SFA   POLYPECTOMY  11/28/2018   Procedure: POLYPECTOMY;  Surgeon: Rogene Houston,  MD;  [facility];  Service: Endoscopy;;  duodenum   POSTERIOR LUMBAR FUSION     RHINOPLASTY     RIGHT HEART CATH N/A 05/18/2017   Procedure: RIGHT HEART CATH;  Surgeon: Jettie Booze, MD;  Location: [place] [facility];  Service: Cardiovascular;  Laterality: N/A;   TEE WITHOUT CARDIOVERSION N/A 09/18/2017   Procedure: TRANSESOPHAGEAL ECHOCARDIOGRAM (TEE);  Surgeon: Fay Records, MD;  Location: [place];  Service: Cardiovascular;  Laterality: N/A;   TOTAL KNEE ARTHROPLASTY Bilateral    TRANSMETATARSAL AMPUTATION Right 02/04/2020   Procedure: RIGHT TRANSMETATARSAL AMPUTATION;  Surgeon: Serafina Mitchell, MD;  Location: [place];  Service: Vascular;  Laterality: Right;   TRANSURETHRAL RESECTION OF PROSTATE  2001   [place]   Social History:  reports that he quit smoking about 38 years ago. His smoking use included cigarettes. He started smoking about 68 years ago. He has a 60.00 pack-year smoking history. He has  never used smokeless tobacco. He reports that he does not currently use alcohol. He reports that he does not use drugs.  Allergies  Allergen Reactions   Codeine Shortness Of Breath   Doxycycline Swelling    Swelling and numbness in lips and face. Swelling improved after stopping. Reports still experiences numbness in bottom lip.    Feraheme [Ferumoxytol] Other (See Comments)    Diaphoretic, chest pain   Heparin Other (See Comments)    +HIT,  Severe bleeding (with heparin drip & large doses), tolerates low doses   Iron Shortness Of Breath   Losartan Swelling   Oxycodone Other (See Comments)    "Made me act out of my mind" Mental status changes- hallucinations   Latex Rash    Family History  Problem Relation Age of Onset   Heart attack Father 72   COPD Father    COPD Mother    Heart disease Mother    Diabetes Mother    Hypertension Sister    CVA Sister    Diabetes Sister    Multiple sclerosis Sister     Prior to Admission medications   Medication Sig Start Date End Date Taking? Authorizing Provider  acetaminophen (TYLENOL) 500 MG tablet Take 1 tablet (500 mg total) by mouth every 6 (six) hours as needed for mild pain or headache. Patient taking differently: Take 1,000 mg by mouth every 6 (six) hours as needed for mild pain or headache. 03/03/19  Yes Allie Bossier, MD  apixaban (ELIQUIS) 2.5 MG TABS tablet Take 1 tablet (2.5 mg total) by mouth 2 (two) times daily. Patient taking differently: Take 5 mg by mouth 2 (two) times daily. 12/19/20  Yes Baglia, Corrina, PA-C  atorvastatin (LIPITOR) 80 MG tablet Take 80 mg by mouth every evening.  05/23/19  Yes [provider]  Cholecalciferol (VITAMIN D3) 50 MCG (2000 UT) TABS Take 2,000 Units by mouth daily.   Yes [provider]  clopidogrel (PLAVIX) 75 MG tablet Take 1 tablet (75 mg total) by mouth daily with breakfast. 12/19/20  Yes Baglia, Corrina, PA-C  Cyanocobalamin (B-12) 2500 MCG TABS Take 2,500 mcg by mouth  daily.   Yes [provider]  folic acid (FOLVITE) 1 MG tablet Take 1 tablet (1 mg total) by mouth daily. 12/06/17  Yes Oswald Hillock, MD  furosemide (LASIX) 80 MG tablet Take 80 mg by mouth See admin instructions. Take 80 mg by mouth in the morning and between 5-6 PM daily 11/19/19  Yes [provider]  insulin glargine (LANTUS)  100 UNIT/ML injection Inject 30 Units into the skin 2 (two) times daily.   Yes [provider]  ipratropium-albuterol (DUONEB) 0.5-2.5 (3) MG/3ML SOLN Take 3 mLs by nebulization every other day. 11/08/19  Yes [provider]  isosorbide mononitrate (IMDUR) 60 MG 24 hr tablet Take 1 tablet (60 mg total) by mouth 2 (two) times daily. 10/26/21  Yes Branch, Alphonse Guild, MD  lisinopril (ZESTRIL) 5 MG tablet Take 0.5 tablets (2.5 mg total) by mouth daily. 11/29/18  Yes Barton Dubois, MD  nitroGLYCERIN (NITROSTAT) 0.4 MG SL tablet PLACE 1 TABLET (0.4 MG TOTAL) UNDER THE TONGUE EVERY 5  MINUTES AS NEEDED FOR CHEST PAIN. Patient taking differently: Place 0.4 mg under the tongue every 5 (five) minutes as needed for chest pain. 05/22/17  Yes BranchAlphonse Guild, MD  nystatin (MYCOSTATIN) 100000 UNIT/ML suspension Take by mouth. 11/09/21  Yes [provider]  pantoprazole (PROTONIX) 40 MG tablet Take 1 tablet (40 mg total) by mouth 2 (two) times daily. To protect stomach while taking multiple blood thinners. Patient taking differently: Take 40 mg by mouth daily. To protect stomach while taking multiple blood thinners. 11/29/18  Yes Barton Dubois, MD  potassium chloride SA (K-DUR) 20 MEQ tablet Take 1 tablet (20 mEq total) by mouth daily. 11/30/18  Yes Barton Dubois, MD  SPIRIVA HANDIHALER 18 MCG inhalation capsule Place 1 capsule into inhaler and inhale daily after breakfast.  01/23/20  Yes [provider]  traMADol (ULTRAM) 50 MG tablet Take 1 tablet (50 mg total) by mouth every 6 (six) hours as needed. 12/19/20 12/19/21 Yes Baglia, Corrina, PA-C   traZODone (DESYREL) 100 MG tablet Take 150 mg by mouth at bedtime. 03/25/16  Yes [provider]  Accu-Chek Softclix Lancets lancets  02/19/20   [provider]  Alcohol Swabs (B-D SINGLE USE SWABS REGULAR) PADS  02/17/20   [provider]  Blood Glucose Monitoring Suppl (ACCU-CHEK GUIDE) w/Device KIT  02/19/20   [provider]  [place] X 5/16" 1 ML [place]  09/24/19   [provider]  metFORMIN (GLUCOPHAGE) 1000 MG tablet Take 1,000 mg by mouth 2 (two) times daily. Patient not taking: Reported on 11/10/2021 05/22/17   [provider]  OXYGEN Inhale 2 L/min into the lungs See admin instructions. 2 L/min of oxygen at bedtime and during the day as needed for shortness of breath    [provider]  TRUE METRIX BLOOD GLUCOSE TEST test strip  07/12/19   [provider]    Physical Exam: Vitals:   11/10/21 1645 11/10/21 2030 11/10/21 2100 11/10/21 2130  BP: 103/70 112/74 113/66 128/62  Pulse: 80 81 71 67  Resp: 15 (!) _0 Temp: 98 F (36.7 C)     TempSrc: Oral     SpO2: 97% 98% 97% 96%   Constitutional: NAD, calm, comfortable Eyes: PERRL, lids and conjunctivae normal ENMT: Mucous membranes are moist. Posterior pharynx clear of any exudate or lesions.Normal dentition.  Neck: normal, supple, no masses, no thyromegaly Respiratory: clear to auscultation bilaterally, no wheezing, no crackles. Normal respiratory effort. No accessory muscle use.  Cardiovascular: Regular rate and rhythm, no murmurs / rubs / gallops. No extremity edema. 2+ pedal pulses. No carotid bruits.  Abdomen: no tenderness, no masses palpated. No hepatosplenomegaly. Bowel sounds positive.  Musculoskeletal: no clubbing / cyanosis. No joint deformity upper and lower extremities. Good ROM, no contractures. Normal muscle tone.  Skin:     Neurologic: CN 2-12  grossly intact. Sensation intact, DTR normal. Strength 5/5 in all 4.   Psychiatric: Normal judgment and insight. Alert and oriented x 3. Normal mood.   Data Reviewed:    CBC    Component Value Date/Time   WBC 14.4 (H) 11/10/2021 1341   RBC 3.60 (L) 11/10/2021 1341   HGB 7.7 (L) 11/10/2021 1341   HGB 12.3 (L) 05/30/2017 1531   HCT 24.7 (L) 11/10/2021 1341   HCT 37.7 05/30/2017 1531   PLT 441 (H) 11/10/2021 1341   PLT 212 05/30/2017 1531   MCV 68.6 (L) 11/10/2021 1341   MCV 86 05/30/2017 1531   MCH 21.4 (L) 11/10/2021 1341   MCHC 31.2 11/10/2021 1341   RDW 20.4 (H) 11/10/2021 1341   RDW 16.0 (H) 05/30/2017 1531   LYMPHSABS 0.9 02/02/2020 1603   MONOABS 2.2 (H) 02/02/2020 1603   EOSABS 0.1 02/02/2020 1603   BASOSABS 0.0 02/02/2020 1603      Latest Ref Rng & Units 11/10/2021    1:41 PM 12/19/2020    1:05 AM 12/17/2020   12:45 AM  BMP  Glucose 70 - 99 mg/dL 110  204  202   BUN 8 - 23 mg/dL _0 Creatinine 0.61 - 1.24 mg/dL 2.63  1.58  1.87   Sodium 135 - 145 mmol/L 135  137  135   Potassium 3.5 - 5.1 mmol/L 4.9  3.9  4.5   Chloride 98 - 111 mmol/L 100  104  101   CO2 22 - 32 mmol/L _1 Calcium 8.9 - 10.3 mg/dL 8.5  8.4  8.2    ESR 85  MRI L foot: IMPRESSION: 1. Bone marrow edema of the distal half of the second metatarsal and proximal phalanx of the second digit with subluxation at the metatarsophalangeal joint, which in the presence of adjacent deep skin wound phlegmonous changes is consistent with osteomyelitis with septic arthritis.   2. Bone marrow edema of the proximal phalanx of the third digit with surrounding edema and inflammatory changes also concerning for osteomyelitis.   3. Deep skin wound and phlegmonous changes about the plantar aspect of the second digit as well as inflammatory changes about the third proximal phalanx suggesting phlegmonous process. Clinical correlation is suggested,   4.  Additional chronic findings as above.  Assessment and Plan: * Gangrene of left foot (HCC) L foot diabetic  foot infection with diabetic foot ulcers, wet gangrene, surrounding cellulitis (see photos above).  As well as osteomyelitis on MRI. Failed outpt bactrim that hes been on for the past week. Pt already seen by surgeon in ED waiting room (see Dr. Trula Slade note): Likely needs surgery during this admission per his note Holding eliquis Will let him eat for the moment though LE wound pathway Rocephin + Flagyl + LZD for the moment H/o MRSA infection of foot in 11/2020, h/o MRSA bacteremia in past (looks like 2019) as well: Anti-MRSA coverage is therefore justified and appropriate. Discussed with Bertis Ruddy, RPH: given the current question of what actual GFR is (see AKI below), will use LZD for the moment. Wound care consult  Acute kidney injury superimposed on chronic kidney disease ([place]) Pt appears to have CKD 3 at baseline with a creat of 1.2 as of 2021, discharge creat of 1.58 from hospital stay in Sept 2022. Creat today of 2.6, though it is unclear how much of this is true AKI (reduction in GFR) vs how much of this is  creat elevation secondary to being on bactrim for the past 1 week. BUN of 25 today isnt significantly elevated from priors. Stop bactrim Hold lasix + lisinopril for the moment Repeat BMP in AM Strict intake and output Check UA + Micro If not rapidly improving, consider renal US + nephro consult.  Chronic diastolic CHF (congestive heart failure) (HCC) Cont Imdur Hold lasix and lisinopril given ? Of AKI.  Type 2 diabetes mellitus (HCC) Hold metformin. Takes 30u Lantus BID at home. Will put on 20u lantus BID here And mod scale SSI AC/HS Check A1C Looks like diabetic foot ulcers also present with the gangrene and infection of left foot.  PAF (paroxysmal atrial fibrillation) ([place]) Hold eliquis.  CAD S/P percutaneous coronary angioplasty Hold eliquis. Hold Plavix Continue statin  Essential hypertension Cont imdur. Hold lisinopril in setting of ? AKI.       Advance Care Planning:   Code Status: Full Code  Consults: Vasc surg Dr. Trula Slade  Family Communication: No family in room  Severity of Illness: The appropriate patient status for this patient is INPATIENT. Inpatient status is judged to be reasonable and necessary in order to provide the required intensity of service to ensure the patient's safety. The patient's presenting symptoms, physical exam findings, and initial radiographic and laboratory data in the context of their chronic comorbidities is felt to place them at high risk for further clinical deterioration. Furthermore, it is not anticipated that the patient will be medically stable for discharge from the hospital within 2 midnights of admission.   * I certify that at the point of admission it is my clinical judgment that the patient will require inpatient hospital care spanning beyond 2 midnights from the point of admission due to high intensity of service, high risk for further deterioration and high frequency of surveillance required.*  Author: Etta Quill., DO 11/10/2021 10:34 PM  For on call review [email or web address].

## 2021-11-10 NOTE — Assessment & Plan Note (Addendum)
L foot diabetic foot infection with diabetic foot ulcers, wet gangrene, surrounding cellulitis (see photos above).  As well as osteomyelitis on MRI. Failed outpt bactrim that hes been on for the past week. 1. Pt already seen by surgeon in ED waiting room (see Dr. Trula Slade note): 1. Likely needs surgery during this admission per his note 2. Holding eliquis 3. Will let him eat for the moment though 2. LE wound pathway 3. Rocephin + Flagyl + LZD for the moment 1. H/o MRSA infection of foot in 11/2020, h/o MRSA bacteremia in past (looks like 2019) as well: Anti-MRSA coverage is therefore justified and appropriate. 2. Discussed with Bertis Ruddy, RPH: given the current question of what actual GFR is (see AKI below), will use LZD for the moment. 4. Wound care consult

## 2021-11-10 NOTE — Telephone Encounter (Signed)
Dr Barbra Sarks called from [place] stating patient was seen and right foot had worsened.  States upon exam she was able to see tendon/bone.  The foot is swollen, red and gangrenous with worsening pain. Patient was unable to have xray due to pain when trying to lift foot. She placed patient on Bactrim DS bid.  Patient was instructed to go to [facility] ED.

## 2021-11-10 NOTE — Assessment & Plan Note (Addendum)
Pt appears to have CKD 3 at baseline with a creat of 1.2 as of 2021, discharge creat of 1.58 from hospital stay in Sept 2022. Creat today of 2.6, though it is unclear how much of this is true AKI (reduction in GFR) vs how much of this is creat elevation secondary to being on bactrim for the past 1 week. BUN of 25 today isnt significantly elevated from priors. 1. Stop bactrim 2. Hold lasix + lisinopril for the moment 3. Repeat BMP in AM 4. Strict intake and output 5. Check UA + Micro 6. If not rapidly improving, consider renal US + nephro consult.

## 2021-11-10 NOTE — Assessment & Plan Note (Signed)
Cont imdur. Hold lisinopril in setting of ? AKI.

## 2021-11-10 NOTE — Progress Notes (Signed)
Pharmacy Antibiotic Note  Mike Wright is a 88 y.o. male admitted on 11/10/2021 presenting with worsening foot wound on abx PTA.  Pharmacy has been consulted for vancomycin dosing.  Ctx/flagyl per MD  Plan: Vancomycin 1500 mg IV x 1, then 1250 mg IV q 48h (eAUC 521, SCr 2.63) Monitor renal function, Cx and clinical progression to narrow Vancomycin levels at steady state      Temp (24hrs), Avg:98 F (36.7 C), Min:97.9 F (36.6 C), Max:98.1 F (36.7 C)  Recent Labs  Lab 11/10/21 1341  WBC 14.4*  CREATININE 2.63*  LATICACIDVEN 1.5    CrCl cannot be calculated (Unknown ideal weight.).    Allergies  Allergen Reactions   Codeine Shortness Of Breath   Doxycycline Swelling    Swelling and numbness in lips and face. Swelling improved after stopping. Reports still experiences numbness in bottom lip.    Feraheme [Ferumoxytol] Other (See Comments)    Diaphoretic, chest pain   Heparin Other (See Comments)    +HIT,  Severe bleeding (with heparin drip & large doses), tolerates low doses   Iron Shortness Of Breath   Losartan Swelling   Oxycodone Other (See Comments)    "Made me act out of my mind" Mental status changes- hallucinations   Latex Rash    Bertis Ruddy, PharmD Clinical Pharmacist ED Pharmacist Phone # [phone] 11/10/2021 9:41 PM

## 2021-11-10 NOTE — ED Notes (Signed)
Received verbal report from [place] at this time

## 2021-11-10 NOTE — Consult Note (Signed)
Full note to follow. Seen and examined the patient in the waiting area to the ER.  He has new wounds with ischemic changes and odor to the left foot.  I have recommended that he get admitted by the hospitalist for IV antibiotics.  I have also ordered a lower extremity duplex and ABIs to evaluate his blood flow.  He will likely need surgical intervention this hospitalization, however he is on Eliquis and so I would hold this for the time being.  I will follow-up tomorrow.  [name] Vascular surgery                                           [facility] [place]  Patient name: Mike Wright MRN: [id number] DOB: [birth year] Sex: male   REQUESTING PROVIDER:    ER   REASON FOR CONSULT:    Left foot wound  HISTORY OF PRESENT ILLNESS:   Mike Wright is a 88 y.o. male, who is I have been following for many years for diabetic related foot infections.  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  He now presents with worsening infection and ischemic changes to the left second and third toe.   He has history coronary artery disease, status post PCI.  He has a remote history of tobacco abuse and suffers from COPD.  He is a diabetic.  He is on Eliquis for atrial fibrillation.  He takes a statin for hypercholesterolemia.  His medical managed for hypertension.  PAST MEDICAL HISTORY    Past Medical History:  Diagnosis Date   Anemia    a. mild, noted 04/2017.   CAD in native artery    a. [name] 04/2017 s/p DES to D1, DES to prox-mid LAD, DES to prox LAD overlapping the prior stent, LVEF 55-65%.    Chronic diastolic CHF (congestive heart failure) (HCC)    Constipation    COPD (chronic obstructive pulmonary disease) (HCC)    Diabetic ulcer of toe (HCC)    DJD (degenerative joint disease) of cervical spine    Dysrhythmia    AFib    Essential hypertension    GERD (gastroesophageal reflux disease)    History of hiatal hernia    HIT (heparin-induced thrombocytopenia) ([place])    Hypothyroidism    Hypoxia    a. went home on home O2 04/2017.   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   Retinitis    Sinus bradycardia    a. HR 30s-40s in 04/2017 -> diltiazem stopped, metoprolol reduced.   Sleep apnea    "chose not to order CPAP at this time" (05/18/2017)   Type 2 diabetes mellitus ([place])    Wears glasses    Wheezing    a. suspected COPD 04/2017. Former tobacco x 40 years.     FAMILY HISTORY   Family History  Problem Relation Age of Onset   Heart attack Father 43   COPD Father    COPD Mother    Heart disease Mother    Diabetes  Mother    Hypertension Sister    CVA Sister    Diabetes Sister    Multiple sclerosis Sister     SOCIAL HISTORY:   Social History   Socioeconomic History   Marital status: Married    Spouse name: patsy   Number of children: Not on file   Years of education: Not on file   Highest education level: Not on file  Occupational History   Not on file  Tobacco Use   Smoking status: Former    Packs/day: 2.00    Years: 30.00    Total pack years: 60.00    Types: Cigarettes    Start date: 03/28/1953    Quit date: 03/29/1983    Years since quitting: 38.6   Smokeless tobacco: Never  Vaping Use   Vaping Use: Never used  Substance and Sexual Activity   Alcohol use: Not Currently    Comment: 05/18/2017 'I drink a beer q yr"   Drug use: No   Sexual activity: Not Currently  Other Topics Concern   Not on file  Social History Narrative   Patient lives at home with his spouse.    Social Determinants of Health   Financial Resource Strain: Medium Risk (12/11/2018)   Overall Financial Resource Strain  (CARDIA)    Difficulty of Paying Living Expenses: Somewhat hard  Food Insecurity: No Food Insecurity (10/08/2020)   Hunger Vital Sign    Worried About Running Out of Food in the Last Year: Never true    Ran Out of Food in the Last Year: Never true  Transportation Needs: No Transportation Needs (07/19/2021)   PRAPARE - Hydrologist (Medical): No    Lack of Transportation (Non-Medical): No  Physical Activity: Not on file  Stress: No Stress Concern Present (07/29/2019)   [place]    Feeling of Stress : Not at all  Social Connections: Not on file  Intimate Partner Violence: Not on file    ALLERGIES:    Allergies  Allergen Reactions   Codeine Shortness Of Breath   Doxycycline Swelling    Swelling and numbness in lips and face. Swelling improved after stopping. Reports still experiences numbness in bottom lip.    Feraheme [Ferumoxytol] Other (See Comments)    Diaphoretic, chest pain   Heparin Other (See Comments)    +HIT,  Severe bleeding (with heparin drip & large doses), tolerates low doses   Iron Shortness Of Breath   Losartan Swelling   Oxycodone Other (See Comments)    "Made me act out of my mind" Mental status changes- hallucinations   Latex Rash    CURRENT MEDICATIONS:    Current Facility-Administered Medications  Medication Dose Route Frequency Provider Last Rate Last Admin   0.9 %  sodium chloride infusion (Manually program via Guardrails IV Fluids)   Intravenous Once Etta Quill, DO       acetaminophen (TYLENOL) tablet 650 mg  650 mg Oral Q6H PRN Etta Quill, DO       Or   acetaminophen (TYLENOL) suppository 650 mg  650 mg Rectal Q6H PRN Etta Quill, DO       atorvastatin (LIPITOR) tablet 80 mg  80 mg Oral QPM Jennette Kettle M, DO   80 mg at 11/10/21 2345   cefTRIAXone (ROCEPHIN) 2 g in sodium chloride 0.9 % 100 mL IVPB  2 g Intravenous Q24H Etta Quill,  DO   Stopped at  11/10/21 2241   insulin aspart (novoLOG) injection 0-15 Units  0-15 Units Subcutaneous TID WC Jennette Kettle M, DO       insulin aspart (novoLOG) injection 0-5 Units  0-5 Units Subcutaneous QHS Jennette Kettle M, DO       insulin glargine-yfgn [facility]) injection 20 Units  20 Units Subcutaneous BID Etta Quill, DO   20 Units at 11/10/21 2350   isosorbide mononitrate (IMDUR) 24 hr tablet 60 mg  60 mg Oral BID Etta Quill, DO   60 mg at 11/10/21 2345   linezolid (ZYVOX) IVPB 600 mg  600 mg Intravenous Q12H Etta Quill, DO   Stopped at 11/11/21 0048   metroNIDAZOLE (FLAGYL) tablet 500 mg  500 mg Oral Q12H Jennette Kettle M, DO   500 mg at 11/10/21 2213   ondansetron (ZOFRAN) tablet 4 mg  4 mg Oral Q6H PRN Etta Quill, DO       Or   ondansetron [facility]) injection 4 mg  4 mg Intravenous Q6H PRN Etta Quill, DO       Current Outpatient Medications  Medication Sig Dispense Refill   acetaminophen (TYLENOL) 500 MG tablet Take 1 tablet (500 mg total) by mouth every 6 (six) hours as needed for mild pain or headache. (Patient taking differently: Take 1,000 mg by mouth every 6 (six) hours as needed for mild pain or headache.) 30 tablet 0   apixaban (ELIQUIS) 2.5 MG TABS tablet Take 1 tablet (2.5 mg total) by mouth 2 (two) times daily. (Patient taking differently: Take 5 mg by mouth 2 (two) times daily.) 60 tablet 11   atorvastatin (LIPITOR) 80 MG tablet Take 80 mg by mouth every evening.      Cholecalciferol (VITAMIN D3) 50 MCG (2000 UT) TABS Take 2,000 Units by mouth daily.     clopidogrel (PLAVIX) 75 MG tablet Take 1 tablet (75 mg total) by mouth daily with breakfast. 30 tablet 2   Cyanocobalamin (B-12) 2500 MCG TABS Take 2,500 mcg by mouth daily.     folic acid (FOLVITE) 1 MG tablet Take 1 tablet (1 mg total) by mouth daily. 30 tablet 1   furosemide (LASIX) 80 MG tablet Take 80 mg by mouth See admin instructions. Take 80 mg by mouth in the morning and between 5-6 PM  daily     insulin glargine (LANTUS) 100 UNIT/ML injection Inject 30 Units into the skin 2 (two) times daily.     ipratropium-albuterol (DUONEB) 0.5-2.5 (3) MG/3ML SOLN Take 3 mLs by nebulization every other day.     isosorbide mononitrate (IMDUR) 60 MG 24 hr tablet Take 1 tablet (60 mg total) by mouth 2 (two) times daily. 180 tablet 3   lisinopril (ZESTRIL) 5 MG tablet Take 0.5 tablets (2.5 mg total) by mouth daily.     nitroGLYCERIN (NITROSTAT) 0.4 MG SL tablet PLACE 1 TABLET (0.4 MG TOTAL) UNDER THE TONGUE EVERY 5  MINUTES AS NEEDED FOR CHEST PAIN. (Patient taking differently: Place 0.4 mg under the tongue every 5 (five) minutes as needed for chest pain.) 25 tablet 3   nystatin (MYCOSTATIN) 100000 UNIT/ML suspension Take by mouth.     pantoprazole (PROTONIX) 40 MG tablet Take 1 tablet (40 mg total) by mouth 2 (two) times daily. To protect stomach while taking multiple blood thinners. (Patient taking differently: Take 40 mg by mouth daily. To protect stomach while taking multiple blood thinners.) 60 tablet 2   potassium chloride SA (K-DUR) 20 MEQ tablet Take 1 tablet (20  mEq total) by mouth daily. 30 tablet 1   SPIRIVA HANDIHALER 18 MCG inhalation capsule Place 1 capsule into inhaler and inhale daily after breakfast.      traMADol (ULTRAM) 50 MG tablet Take 1 tablet (50 mg total) by mouth every 6 (six) hours as needed. 20 tablet 0   traZODone (DESYREL) 100 MG tablet Take 150 mg by mouth at bedtime.     Accu-Chek Softclix Lancets lancets      Alcohol Swabs (B-D SINGLE USE SWABS REGULAR) PADS      Blood Glucose Monitoring Suppl (ACCU-CHEK GUIDE) w/Device KIT      DROPLET INSULIN SYRINGE 31G X 5/16" 1 ML MISC      metFORMIN (GLUCOPHAGE) 1000 MG tablet Take 1,000 mg by mouth 2 (two) times daily. (Patient not taking: Reported on 11/10/2021)     OXYGEN Inhale 2 L/min into the lungs See admin instructions. 2 L/min of oxygen at bedtime and during the day as needed for shortness of breath     TRUE METRIX  BLOOD GLUCOSE TEST test strip       REVIEW OF SYSTEMS:   _0  denotes positive finding, _1  denotes negative finding Cardiac  Comments:  Chest pain or chest pressure:    Shortness of breath upon exertion:    Short of breath when lying flat:    Irregular heart rhythm:        Vascular    Pain in calf, thigh, or hip brought on by ambulation:    Pain in feet at night that wakes you up from your sleep:     Blood clot in your veins:    Leg swelling:         Pulmonary    Oxygen at home:    Productive cough:     Wheezing:         Neurologic    Sudden weakness in arms or legs:     Sudden numbness in arms or legs:     Sudden onset of difficulty speaking or slurred speech:    Temporary loss of vision in one eye:     Problems with dizziness:         Gastrointestinal    Blood in stool:      Vomited blood:         Genitourinary    Burning when urinating:     Blood in urine:        Psychiatric    Major depression:         Hematologic    Bleeding problems:    Problems with blood clotting too easily:        Skin    Rashes or ulcers:        Constitutional    Fever or chills:     PHYSICAL EXAM:   Vitals:   11/11/21 0500 11/11/21 0513 11/11/21 0739 11/11/21 0802  BP: (!) 133/55  128/71 126/63  Pulse: 76  76 75  Resp: _2 Temp:  98.4 F (36.9 C) 98.4 F (36.9 C) 98.5 F (36.9 C)  TempSrc:  Oral Oral Oral  SpO2: 94%  95% 98%  Weight:      Height:        GENERAL: The patient is a well-nourished male, in no acute distress. The vital signs are documented above. CARDIAC: There is a regular rate and rhythm.  VASCULAR: Nonpalpable pedal pulses, mild edema PULMONARY: Nonlabored respirations ABDOMEN: Soft and non-tender with normal pitched bowel sounds.  MUSCULOSKELETAL:  There are no major deformities or cyanosis. NEUROLOGIC: No focal weakness or paresthesias are detected. SKIN: See photos above PSYCHIATRIC: The patient has a normal affect.  STUDIES:   I  reviewed his vascular studies with the following findings:  Left: No hemodynamically significant stenoses noted.  ABI/TBIToday's ABIToday's TBIPrevious ABIPrevious TBI  +-------+-----------+-----------+------------+------------+  Right  108                   .7                        +-------+-----------+-----------+------------+------------+  Left   1.01                  1.03                      +-------+-----------+-----------+------------+------------+  ASSESSMENT and PLAN   Left diabetic foot infection: Based off my history with the patient as well as his most recent vascular studies, I feel his blood flow is optimized.  He has ischemic changes to the second and third toe, I think the best option is to proceed with transmetatarsal amputation.  He will need to be off of his anticoagulation.  He should continue IV antibiotics.  I discussed proceeding with transmetatarsal amputation on Friday.   Leia Alf, MD, FACS [facility] [phone] Pager [phone]

## 2021-11-10 NOTE — ED Provider Triage Note (Signed)
Emergency Medicine Provider Triage Evaluation Note  Mike Wright , a 88 y.o. male  was evaluated in triage.  Pt complains of concern for gangrene, osteomyelitis of left foot.  Patient reports that he has had a left foot wound for the last 3 to 4 weeks.  He already has amputation of the great toe on this foot.  History of diabetes and peripheral heel disease.  He was told to go to the emergency department by Dr. Trula Slade for likely amputation.  He denies systemic fever, chills, nausea, vomiting.  Foul smell of the left foot.  Patient has not been on any antibiotics.  Already has an amputation of right foot..  Review of Systems  Positive: Foot wound, osteo, foul-smelling wound Negative: Fever, chills, nausea, vomiting  Physical Exam  BP (!) 102/59   Pulse 82   Temp 98.1 F (36.7 C) (Oral)   Resp 16   SpO2 97%  Gen:   Awake, no distress   Resp:  Normal effort  MSK:   Moves extremities without difficulty  Other:  Patient with purulent, foul-smelling, gangrenous left foot with erosion.  He has 1+ pulses noted to DP, PT on the affected extremity.  Medical Decision Making  Medically screening exam initiated at 1:19 PM.  Appropriate orders placed.  Mike Wright was informed that the remainder of the evaluation will be completed by another provider, this initial triage assessment does not replace that evaluation, and the importance of remaining in the ED until their evaluation is complete.  Work-up initiated, will attempt to contact Dr. Trula Slade and let him know that this patient is here   Anselmo Pickler, PA-C 11/10/21 1321

## 2021-11-10 NOTE — Assessment & Plan Note (Signed)
Hold eliquis

## 2021-11-10 NOTE — Assessment & Plan Note (Signed)
Hold metformin. Takes 30u Lantus BID at home. Will put on 20u lantus BID here And mod scale SSI AC/HS Check A1C Looks like diabetic foot ulcers also present with the gangrene and infection of left foot.

## 2021-11-10 NOTE — ED Triage Notes (Signed)
Pt was told to come to the ED as he has worsening wound to left foot.  Foot appears gangrenous. Malodorous.

## 2021-11-10 NOTE — Assessment & Plan Note (Signed)
Cont Imdur Hold lasix and lisinopril given ? Of AKI.

## 2021-11-10 NOTE — Assessment & Plan Note (Addendum)
Hold eliquis. Hold Plavix Continue statin

## 2021-11-10 NOTE — ED Provider Notes (Signed)
[facility] EMERGENCY DEPARTMENT Provider Note   CSN: [id number] Arrival date & time: 11/10/21  1213     History  Chief Complaint  Patient presents with   Wound Infection    Mike Wright is a 88 y.o. male with hx of DMT2, peripheral arterial disease, COPD, CAD, paroxysmal A-fib, essential HTN, prior MRSA infection, prior GI bleed, anemia.  Presenting with worsening wound of the left foot that began 2 weeks ago.  Pain rated 2/10.  Patient concerned of gangrene.  Infection appears to be spreading from the second toe to the surrounding area.  Also complaining of foul odor from the wound.  Denies fevers or chills.  Denies shortness of breath or chest pain.  Decreased sensation of bilateral feet at baseline. Has been on bactrim for 10 days.  The history is provided by the patient and medical records.      Home Medications Prior to Admission medications   Medication Sig Start Date End Date Taking? Authorizing Provider  Accu-Chek Softclix Lancets lancets  02/19/20   [provider]  acetaminophen (TYLENOL) 500 MG tablet Take 1 tablet (500 mg total) by mouth every 6 (six) hours as needed for mild pain or headache. Patient taking differently: Take 1,000 mg by mouth every 6 (six) hours as needed for mild pain or headache. 03/03/19   Allie Bossier, MD  Alcohol Swabs (B-D SINGLE USE SWABS REGULAR) PADS  02/17/20   [provider]  apixaban (ELIQUIS) 2.5 MG TABS tablet Take 1 tablet (2.5 mg total) by mouth 2 (two) times daily. Patient taking differently: Take 5 mg by mouth 2 (two) times daily. 12/19/20   Baglia, Corrina, PA-C  atorvastatin (LIPITOR) 80 MG tablet Take 80 mg by mouth every evening.  05/23/19   [provider]  Blood Glucose Monitoring Suppl (ACCU-CHEK GUIDE) w/Device KIT  02/19/20   [provider]  Cholecalciferol (VITAMIN D3) 50 MCG (2000 UT) TABS Take 2,000 Units by mouth daily.    [provider]  clopidogrel  (PLAVIX) 75 MG tablet Take 1 tablet (75 mg total) by mouth daily with breakfast. 12/19/20   Baglia, Corrina, PA-C  Cyanocobalamin (B-12) 2500 MCG TABS Take 2,500 mcg by mouth daily.    [provider]  DROPLET INSULIN SYRINGE 31G X 5/16" 1 ML [place]  09/24/19   [provider]  folic acid (FOLVITE) 1 MG tablet Take 1 tablet (1 mg total) by mouth daily. 12/06/17   Oswald Hillock, MD  furosemide (LASIX) 80 MG tablet Take 80 mg by mouth See admin instructions. Take 80 mg by mouth in the morning and between 5-6 PM daily 11/19/19   [provider]  insulin glargine (LANTUS) 100 UNIT/ML injection Inject 30-40 Units into the skin See admin instructions. Inject 40 units into the skin in the morning and 30 units at bedtime    [provider]  ipratropium-albuterol (DUONEB) 0.5-2.5 (3) MG/3ML SOLN Take 3 mLs by nebulization every other day. 11/08/19   [provider]  isosorbide mononitrate (IMDUR) 60 MG 24 hr tablet Take 1 tablet (60 mg total) by mouth 2 (two) times daily. 10/26/21   Arnoldo Lenis, MD  lisinopril (ZESTRIL) 5 MG tablet Take 0.5 tablets (2.5 mg total) by mouth daily. 11/29/18   Barton Dubois, MD  meclizine (ANTIVERT) 25 MG tablet Take 12.5 mg by mouth daily as needed for dizziness.    [provider]  metFORMIN (GLUCOPHAGE) 1000 MG tablet Take 1,000 mg by mouth  2 (two) times daily. 05/22/17   [provider]  nitroGLYCERIN (NITROSTAT) 0.4 MG SL tablet PLACE 1 TABLET (0.4 MG TOTAL) UNDER THE TONGUE EVERY 5  MINUTES AS NEEDED FOR CHEST PAIN. 05/22/17   Arnoldo Lenis, MD  ondansetron (ZOFRAN) 4 MG tablet Take 4 mg by mouth daily as needed for nausea/vomiting. 12/08/20   [provider]  OXYGEN Inhale 2 L/min into the lungs See admin instructions. 2 L/min of oxygen at bedtime and during the day as needed for shortness of breath    [provider]  pantoprazole (PROTONIX) 40 MG tablet Take 1 tablet (40 mg total) by mouth 2  (two) times daily. To protect stomach while taking multiple blood thinners. Patient taking differently: Take 40 mg by mouth daily. To protect stomach while taking multiple blood thinners. 11/29/18   Barton Dubois, MD  potassium chloride SA (K-DUR) 20 MEQ tablet Take 1 tablet (20 mEq total) by mouth daily. 11/30/18   Barton Dubois, MD  psyllium (METAMUCIL) 58.6 % packet Take 0.5 packets by mouth daily. [place] as needed    [provider]  SPIRIVA HANDIHALER 18 MCG inhalation capsule Place 1 capsule into inhaler and inhale daily after breakfast.  01/23/20   [provider]  traMADol (ULTRAM) 50 MG tablet Take 1 tablet (50 mg total) by mouth every 6 (six) hours as needed. 12/19/20 12/19/21  Baglia, Corrina, PA-C  traZODone (DESYREL) 100 MG tablet Take 150 mg by mouth at bedtime. 03/25/16   [provider]  TRUE METRIX BLOOD GLUCOSE TEST test strip  07/12/19   [provider]      Allergies    Codeine, Doxycycline, Feraheme [ferumoxytol], Heparin, Iron, Losartan, Oxycodone, and Latex    Review of Systems   Review of Systems  Skin:  Positive for wound.    Physical Exam Updated Vital Signs BP 103/70 (BP Location: Right Arm)   Pulse 80   Temp 98 F (36.7 C) (Oral)   Resp 15   SpO2 97%  Physical Exam Vitals and nursing note reviewed.  Constitutional:      General: He is not in acute distress.    Appearance: Normal appearance. He is well-developed. He is not ill-appearing, toxic-appearing or diaphoretic.  HENT:     Head: Normocephalic and atraumatic.  Eyes:     Conjunctiva/sclera: Conjunctivae normal.  Cardiovascular:     Rate and Rhythm: Normal rate and regular rhythm.     Heart sounds: No murmur heard. Pulmonary:     Effort: Pulmonary effort is normal. No respiratory distress.     Breath sounds: Normal breath sounds.  Abdominal:     Palpations: Abdomen is soft.     Tenderness: There is no abdominal tenderness.  Musculoskeletal:         General: No swelling.     Cervical back: Neck supple. No rigidity.     Comments: Reduced ROM of digits of left foot  Skin:    General: Skin is warm and dry.     Capillary Refill: Capillary refill takes less than 2 seconds.     Findings: Erythema and lesion present.     Comments: See pictures for details  Neurological:     Mental Status: He is alert and oriented to person, place, and time.     Comments: Decreased sensation of feet at baseline  Psychiatric:        Mood and Affect: Mood normal.  ED Results / Procedures / Treatments   Labs (all labs ordered are listed, but only abnormal results are displayed) Labs Reviewed  CBC - Abnormal; Notable for the following components:      Result Value   WBC 14.4 (*)    RBC 3.60 (*)    Hemoglobin 7.7 (*)    HCT 24.7 (*)    MCV 68.6 (*)    MCH 21.4 (*)    RDW 20.4 (*)    Platelets 441 (*)    All other components within normal limits  BASIC METABOLIC PANEL - Abnormal; Notable for the following components:   Glucose, Bld 110 (*)    BUN 25 (*)    Creatinine, Ser 2.63 (*)    Calcium 8.5 (*)    GFR, Estimated 23 (*)    All other components within normal limits  C-REACTIVE PROTEIN - Abnormal; Notable for the following components:   CRP 8.5 (*)    All other components within normal limits  SEDIMENTATION RATE - Abnormal; Notable for the following components:   Sed Rate 85 (*)    All other components within normal limits  LACTIC ACID, PLASMA  LACTIC ACID, PLASMA  DIFFERENTIAL  CBG MONITORING, ED    EKG None  Radiology MR FOOT LEFT WO CONTRAST  Result Date: 11/10/2021 CLINICAL DATA:  Osteomyelitis of the foot suspected. New wound with ischemic changes and order to the left foot. EXAM: MRI OF THE LEFT FOOT WITHOUT CONTRAST TECHNIQUE: Multiplanar, multisequence MR imaging of the left was performed. No intravenous contrast was administered. COMPARISON:  Radiographs dated November 10, 2021. FINDINGS:  Bones/Joint/Cartilage Postsurgical changes for prior amputation of the first ray through the mid first metatarsal. There is bone marrow edema in the distal half of the second metatarsal and in the proximal phalanx of the second metatarsal. Superior subluxation of the second metatarsal at the metatarsophalangeal joint. Marrow edema at the base of the proximal phalanx of the third digit. Ligaments Lisfranc ligament appear intact. Evaluation of collateral ligaments is limited due to motion. Muscles and Tendons Increased intramuscular signal of the plantar muscles secondary to diabetic myopathy/myositis. No appreciable fluid collection. Soft tissues There is soft tissue edema and open wound on the plantar aspect of the second digit with surrounding phlegmonous changes. There is soft tissue edema and likely small amount of free air on the dorsal aspect of the third digit. Soft tissue swelling about the dorsum of the foot. IMPRESSION: 1. Bone marrow edema of the distal half of the second metatarsal and proximal phalanx of the second digit with subluxation at the metatarsophalangeal joint, which in the presence of adjacent deep skin wound phlegmonous changes is consistent with osteomyelitis with septic arthritis. 2. Bone marrow edema of the proximal phalanx of the third digit with surrounding edema and inflammatory changes also concerning for osteomyelitis. 3. Deep skin wound and phlegmonous changes about the plantar aspect of the second digit as well as inflammatory changes about the third proximal phalanx suggesting phlegmonous process. Clinical correlation is suggested, 4.  Additional chronic findings as above. Electronically Signed   By: Keane Police D.O.   On: 11/10/2021 17:59   VAS [place] ABI WITH/WO TBI  Result Date: 11/10/2021  LOWER EXTREMITY DOPPLER STUDY Patient Name:  Mike Wright  Date of Exam:   11/10/2021 Medical Rec #: [id number]        Accession #:    [id number] Date of Birth: Aug 03, 1933        Patient  Gender: M Patient  Age:   40 years Exam Location:  [facility] Procedure:      VAS [place] ABI WITH/WO TBI Referring Phys: CHRISTIAN PROSPERI --------------------------------------------------------------------------------  Indications: Gangrene. right transmetatarsal amputation and Left great and              second toes. High Risk Factors: Hypertension, hyperlipidemia, Diabetes, past history of                    smoking, coronary artery disease.  Comparison Study: 05/17/21 at outside facility RT 0.7, LT 1.03 Performing Technologist: Bobetta Lime BS RVT  Examination Guidelines: A complete evaluation includes at minimum, Doppler waveform signals and systolic blood pressure reading at the level of bilateral brachial, anterior tibial, and posterior tibial arteries, when vessel segments are accessible. Bilateral testing is considered an integral part of a complete examination. Photoelectric Plethysmograph (PPG) waveforms and toe systolic pressure readings are included as required and additional duplex testing as needed. Limited examinations for reoccurring indications may be performed as noted.  ABI Findings: +---------+------------------+-----+---------+----------+ Right    Rt Pressure (mmHg)IndexWaveform Comment    +---------+------------------+-----+---------+----------+ Brachial 119                    triphasic           +---------+------------------+-----+---------+----------+ PTA      128               1.07 triphasic           +---------+------------------+-----+---------+----------+ DP       118               0.98 triphasic           +---------+------------------+-----+---------+----------+ Great Toe                                Amputation +---------+------------------+-----+---------+----------+ +---------+------------------+-----+----------+----------+ Left     Lt Pressure (mmHg)IndexWaveform  Comment    +---------+------------------+-----+----------+----------+  Brachial 120                    triphasic            +---------+------------------+-----+----------+----------+ PTA      121               1.01 biphasic             +---------+------------------+-----+----------+----------+ DP       92                0.77 monophasic           +---------+------------------+-----+----------+----------+ Great Toe                                 Amputation +---------+------------------+-----+----------+----------+ +-------+-----------+-----------+------------+------------+ ABI/TBIToday's ABIToday's TBIPrevious ABIPrevious TBI +-------+-----------+-----------+------------+------------+ Right  108                   .7                       +-------+-----------+-----------+------------+------------+ Left   1.01                  1.03                     +-------+-----------+-----------+------------+------------+ Right ABIs appear increased. Left ABIs appear essentially unchanged. Right transmetatarsal amputation, left great toe and second toe amputation  Summary: Right: Resting right ankle-brachial index is within normal range. Left: Resting left ankle-brachial index indicates noncompressible left lower extremity arteries. *See table(s) above for measurements and observations.  Electronically signed by Jamelle Haring on 11/10/2021 at 5:27:22 PM.    Final    VAS [place] LOWER EXTREMITY ARTERIAL DUPLEX (7a-7p)  Result Date: 11/10/2021 LOWER EXTREMITY ARTERIAL DUPLEX STUDY Patient Name:  Mike Wright  Date of Exam:   11/10/2021 Medical Rec #: [id number]        Accession #:    [id number] Date of Birth: September 03, 1933        Patient Gender: M Patient Age:   54 years Exam Location:  [facility] Procedure:      VAS [place] LOWER EXTREMITY ARTERIAL DUPLEX Referring Phys: CHRISTIAN PROSPERI --------------------------------------------------------------------------------  Indications: Gangrene. High Risk Factors: Hypertension, hyperlipidemia, Diabetes, past  history of                    smoking, coronary artery disease.  Current ABI: 1.01 Comparison Study: No previous left LE arterial duplex noted. Performing Technologist: Bobetta Lime BS, RVT  Examination Guidelines: A complete evaluation includes B-mode imaging, spectral Doppler, color Doppler, and power Doppler as needed of all accessible portions of each vessel. Bilateral testing is considered an integral part of a complete examination. Limited examinations for reoccurring indications may be performed as noted.   +----------+--------+-----+--------+----------+--------+ LEFT      PSV cm/sRatioStenosisWaveform  Comments +----------+--------+-----+--------+----------+--------+ CIA Distal                     triphasic          +----------+--------+-----+--------+----------+--------+ CFA Distal243                  triphasic          +----------+--------+-----+--------+----------+--------+ DFA       114                  triphasic          +----------+--------+-----+--------+----------+--------+ SFA Prox  209                  triphasic          +----------+--------+-----+--------+----------+--------+ SFA Mid   128                  triphasic          +----------+--------+-----+--------+----------+--------+ SFA Distal111                  triphasic          +----------+--------+-----+--------+----------+--------+ POP Prox  97                   triphasic          +----------+--------+-----+--------+----------+--------+ POP Distal74                   triphasic          +----------+--------+-----+--------+----------+--------+ TP Trunk  108                  triphasic          +----------+--------+-----+--------+----------+--------+ ATA Prox  96                   biphasic           +----------+--------+-----+--------+----------+--------+ ATA Mid   89                   biphasic           +----------+--------+-----+--------+----------+--------+  ATA Distal41                   biphasic           +----------+--------+-----+--------+----------+--------+ PTA Prox  111                  biphasic           +----------+--------+-----+--------+----------+--------+ PTA Mid   193                  biphasic           +----------+--------+-----+--------+----------+--------+ PTA Distal120                  biphasic           +----------+--------+-----+--------+----------+--------+ DP        43                   monophasic         +----------+--------+-----+--------+----------+--------+  Summary: Left: No hemodynamically significant stenoses noted.  See table(s) above for measurements and observations. Electronically signed by Jamelle Haring on 11/10/2021 at 5:27:04 PM.    Final    DG Foot 2 Views Left  Result Date: 11/10/2021 CLINICAL DATA:  88 year old male with concern for osteomyelitis. EXAM: LEFT FOOT - 2 VIEW COMPARISON:  02/02/2020 FINDINGS: Status post amputation of the mid first metatarsal. Dorsal subluxation of the second metatarsophalangeal joint. Subcutaneous emphysema in the dorsal and medial aspect of the second toe. No definite cortical abnormality, however limited evaluation with only two views. Mild plantar calcaneal enthesopathy. IMPRESSION: Soft tissue emphysema about the medial and dorsal aspect of the second toe without definite underlying osseous involvement, however this is a limited study. Consider MRI of the foot for further characterization as clinically indicated. Electronically Signed   By: Ruthann Cancer M.D.   On: 11/10/2021 14:07    Procedures Procedures    Medications Ordered in ED Medications - No data to display  ED Course/ Medical Decision Making/ A&P Clinical Course as of 11/10/21 2119  Wed Nov 10, 2021  2104 Consulted with Dr. Doreatha Martin of orthopedic surgery.  Discussed pt in depth, including pt's prior metatarsal amputation surgical records.  Agreed with plan for likely admission with  hospitalist.   [AC]    Clinical Course User Index [AC] Prince Rome, PA-C                           Medical Decision Making Amount and/or Complexity of Data Reviewed Labs: ordered.  Risk Decision regarding hospitalization.   88 y.o. male presents to the ED for concern of Wound Infection     This involves an extensive number of treatment options, and is a complaint that carries with it a high risk of complications and morbidity.  The emergent differential diagnosis prior to evaluation includes, but is not limited to: Cellulitis, osteomyelitis, gangrene  This is not an exhaustive differential.   Past Medical History / Co-morbidities / Social History: Hx of DMT2, peripheral arterial disease, COPD, CAD, paroxysmal A-fib, essential HTN, prior MRSA infection, prior GI bleed, anemia Social Determinants of Health include: Poorly controlled DMT2, for which counseling was provided  Additional History:  Obtained by chart review.  Notably surgical records of right transmetatarsal amputation right foot 02/04/2020  Lab Tests: I ordered, and personally interpreted labs.  The pertinent results include:   CRP 8.5, ESR 85 WBC 14.4 H&H 7.7/24.7, mildly decreased from baseline PLTs 441  elevated Creatinine 2.63, BUN 25, GFR 23, worsened renal function overall from baseline  Imaging Studies: I ordered imaging studies including XR left foot, arterial ultrasound left lower extremity, ultrasound ABI, and MRI left foot.   I independently visualized and interpreted imaging which showed:  1. Bone marrow edema of the distal half of the second metatarsal and proximal phalanx of the second digit with subluxation at the metatarsophalangeal joint, appears consistent with osteomyelitis with septic arthritis.  2. Bone marrow edema of the proximal phalanx of the third digit with surrounding edema and inflammatory changes concerning for osteomyelitis.  3. Deep skin wound and phlegmonous changes about the  plantar aspect of the second digit as well as inflammatory changes about the third proximal phalanx suggesting phlegmonous process.  Left [place] ABI: Resting left ankle-brachial index indicates noncompressible left lower extremity arteries Left LE arterial duplex: No hemodynamically significant stenoses noted I agree with the radiologist interpretation.  Cardiac Monitoring: The patient was maintained on a cardiac monitor.  I personally viewed and interpreted the cardiac monitored which showed an underlying rhythm of: sinus tachycardia  ED Course / Critical Interventions: Pt well-appearing on exam.  Nonseptic, nontoxic appearing in NAD.  Presenting with worsening wound of the left foot.  First noticed 2 weeks ago.  Progressively has worsened.  Decreased sensation of bilateral feet at baseline.  Denies significant pain, only 2/10 of the left foot.  See pictures above.  MRI imaging indicates likely osteomyelitis of the second and third toes, with possible infection of neighboring tissue.  Pulses faint, CRT appears to be less than 2 seconds.  Further ABI and arterial Doppler indicates arterial blood flow still patent.  Without fever, shortness of breath, chest pain.  Erythema seems to be migrating from the second and third toes, proximally up the foot and of the anterior ankle surface.  Also hx of prior MRSA bacteremia.  Consultation placed for orthopedics.  I suspect patient may need surgical intervention and antibiotics. Upon reevaluation, pt status unchanged.  Also discloses he's been on bactrim for the last 10 days, which may explain his worsening renal function, though unsure at this time.  Consulted with Dr. Doreatha Martin of orthopedic surgery.  Discussed pt in detail.  He agrees with plan for admission with hospitalist team and likely surgical intervention.  Prior amputations performed by vascular specialist Dr. Trula Slade, who will likely provide treatment at this time as well.  Ortho available if needed.   Consultation placed for hospitalist. Consulted with Dr. Alcario Drought of hospitalist team.  Plans to accept the pt and discuss further with Dr. Trula Slade.    Disposition: Admission  I discussed this case with my attending, Dr. Maryan Rued, who agreed with the proposed treatment course and cosigned this note including patient's presenting symptoms, physical exam, and planned diagnostics and interventions.  Attending physician stated agreement with plan or made changes to plan which were implemented.     This chart was dictated using voice recognition software.  Despite best efforts to proofread, errors can occur which can change the documentation meaning.         Final Clinical Impression(s) / ED Diagnoses Final diagnoses:  Osteomyelitis of toe of left foot ([place])  Cellulitis of left lower extremity  Status post coronary artery stent placement  PAD (peripheral artery disease) ([facility])  Open wound of foot with complication, initial encounter  AKI (acute kidney injury) ([place])    Rx / DC Orders ED Discharge Orders     None  Prince Rome, PA-C [birth year] 2140    Blanchie Dessert, MD 11/11/21 [phone]

## 2021-11-10 NOTE — ED Notes (Signed)
Patient currently eating a sandwich with a soda. Will re check CBG after eating.

## 2021-11-11 ENCOUNTER — Inpatient Hospital Stay (HOSPITAL_COMMUNITY): Payer: Medicare HMO

## 2021-11-11 DIAGNOSIS — R079 Chest pain, unspecified: Secondary | ICD-10-CM

## 2021-11-11 DIAGNOSIS — I96 Gangrene, not elsewhere classified: Secondary | ICD-10-CM | POA: Diagnosis not present

## 2021-11-11 LAB — HEMOGLOBIN A1C
Hgb A1c MFr Bld: 6.5 % — ABNORMAL HIGH (ref 4.8–5.6)
Mean Plasma Glucose: 139.85 mg/dL

## 2021-11-11 LAB — CBC
HCT: 21.8 % — ABNORMAL LOW (ref 39.0–52.0)
Hemoglobin: 6.8 g/dL — CL (ref 13.0–17.0)
MCH: 21.8 pg — ABNORMAL LOW (ref 26.0–34.0)
MCHC: 31.2 g/dL (ref 30.0–36.0)
MCV: 69.9 fL — ABNORMAL LOW (ref 80.0–100.0)
Platelets: 266 10*3/uL (ref 150–400)
RBC: 3.12 MIL/uL — ABNORMAL LOW (ref 4.22–5.81)
RDW: 20.5 % — ABNORMAL HIGH (ref 11.5–15.5)
WBC: 10 10*3/uL (ref 4.0–10.5)
nRBC: 0 % (ref 0.0–0.2)

## 2021-11-11 LAB — URINALYSIS, COMPLETE (UACMP) WITH MICROSCOPIC
Bacteria, UA: NONE SEEN
Bilirubin Urine: NEGATIVE
Glucose, UA: NEGATIVE mg/dL
Hgb urine dipstick: NEGATIVE
Ketones, ur: NEGATIVE mg/dL
Leukocytes,Ua: NEGATIVE
Nitrite: NEGATIVE
Protein, ur: NEGATIVE mg/dL
Specific Gravity, Urine: 1.01 (ref 1.005–1.030)
pH: 7 (ref 5.0–8.0)

## 2021-11-11 LAB — ECHOCARDIOGRAM COMPLETE
AR max vel: 1.1 cm2
AV Area VTI: 1.14 cm2
AV Area mean vel: 1.19 cm2
AV Mean grad: 25 mmHg
AV Peak grad: 47.9 mmHg
Ao pk vel: 3.46 m/s
Area-P 1/2: 3.3 cm2
Height: 67 in
P 1/2 time: 617 msec
S' Lateral: 2.8 cm
Weight: 2902.42 oz

## 2021-11-11 LAB — BASIC METABOLIC PANEL
Anion gap: 9 (ref 5–15)
BUN: 26 mg/dL — ABNORMAL HIGH (ref 8–23)
CO2: 23 mmol/L (ref 22–32)
Calcium: 7.9 mg/dL — ABNORMAL LOW (ref 8.9–10.3)
Chloride: 103 mmol/L (ref 98–111)
Creatinine, Ser: 2.61 mg/dL — ABNORMAL HIGH (ref 0.61–1.24)
GFR, Estimated: 23 mL/min — ABNORMAL LOW (ref >60)
Glucose, Bld: 138 mg/dL — ABNORMAL HIGH (ref 70–99)
Potassium: 4.4 mmol/L (ref 3.5–5.1)
Sodium: 135 mmol/L (ref 135–145)

## 2021-11-11 LAB — PREPARE RBC (CROSSMATCH)

## 2021-11-11 LAB — CBG MONITORING, ED
Glucose-Capillary: 85 mg/dL (ref 70–99)
Glucose-Capillary: 118 mg/dL — ABNORMAL HIGH (ref 70–99)

## 2021-11-11 LAB — GLUCOSE, CAPILLARY
Glucose-Capillary: 149 mg/dL — ABNORMAL HIGH (ref 70–99)
Glucose-Capillary: 132 mg/dL — ABNORMAL HIGH (ref 70–99)

## 2021-11-11 IMAGING — DX DG FOOT 2V*R*
2 series · 2 of 2 positions shown · non-contrast
Comparison: 09/13/2017

CLINICAL DATA: Weakness and foot infection

EXAM:
RIGHT FOOT - 2 VIEW

[foot ap]
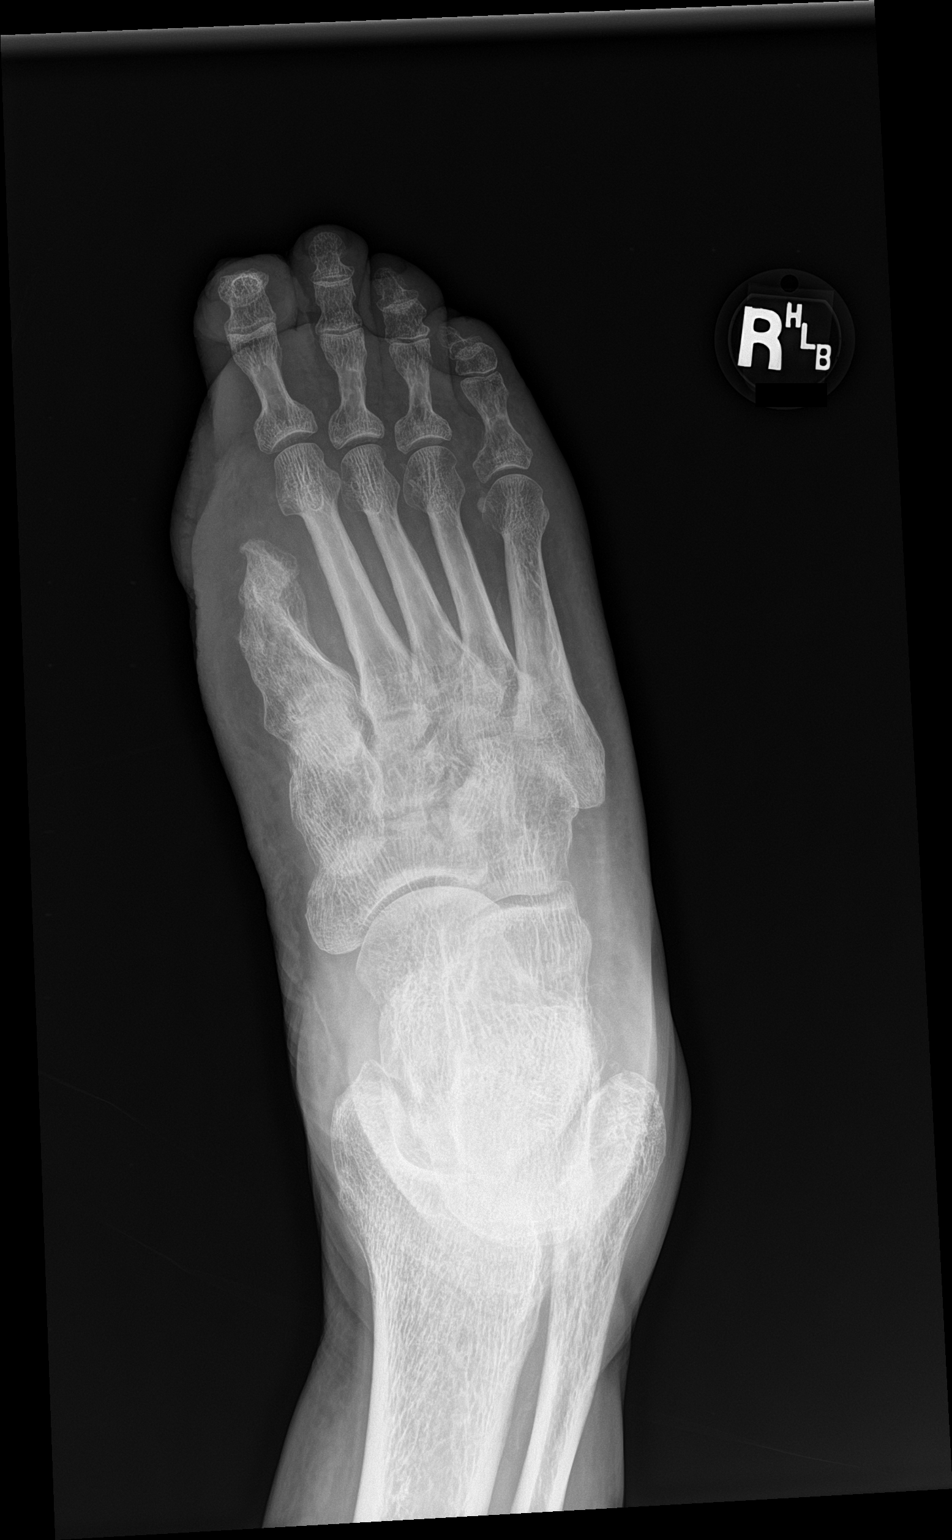

[foot lat]
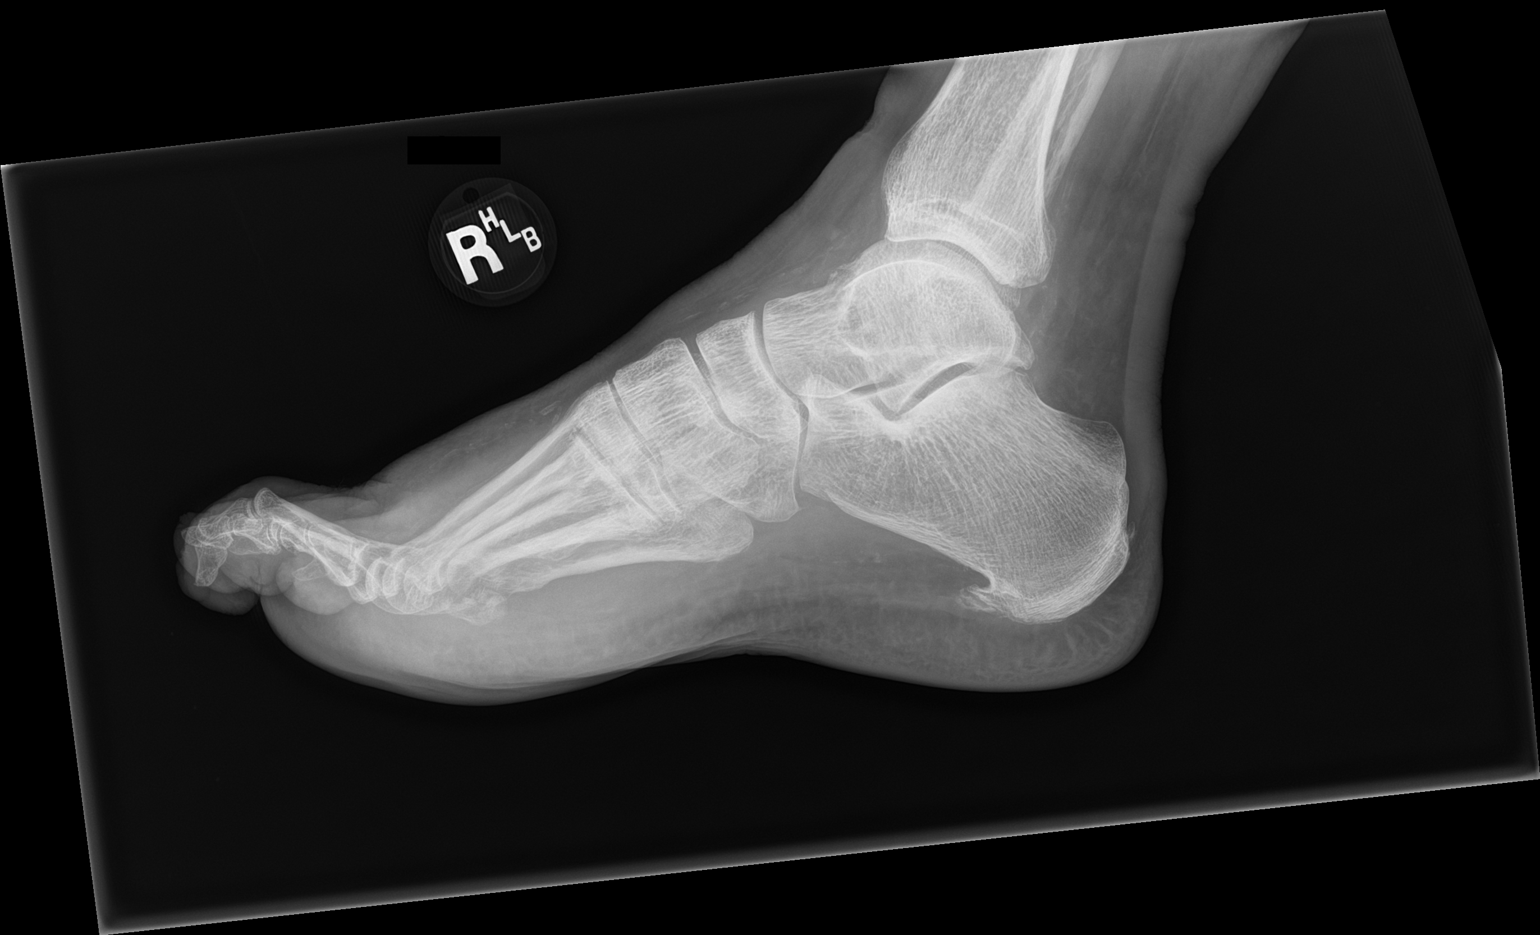

[2 of 2 positions shown; findings below may reference images not displayed]

FINDINGS: Interval partial amputation of the first digit. Chronic appearing
deformity at the residual metatarsal. No acute fracture or
malalignment. No acute bone destruction evident. Questionable small
amount of gas within the soft tissues medial side of the foot.
Plantar calcaneal spur and vascular calcifications.
IMPRESSION: Interval partial amputation of first digit with chronic appearing
deformity at the residual metatarsal. No definite acute osseous
abnormality. Questionable small amount of soft tissue gas medial
side of the foot at the level of the MTP joint.

## 2021-11-11 MED ORDER — SODIUM CHLORIDE 0.9% IV SOLUTION: Freq: Once | INTRAVENOUS | Status: DC

## 2021-11-11 MED ORDER — PERFLUTREN LIPID MICROSPHERE
1.0000 mL | INTRAVENOUS | Status: AC | PRN
  Administered 2021-11-11: 1 mL via INTRAVENOUS

## 2021-11-11 NOTE — Consult Note (Signed)
[facility] Nurse Consult Note: Reason for Consult: LEft foot wounds.  Neuropathic.  History amputation bilateral great toes.  Nonintact lesions to left great toe amputation site, left second metatarsal lesions on medial and dorsal aspects.  Left plantar foot with nonintact lesion, surrounded by calloused tissue circumferentially.  ABIs performed 11/10/21 indicated noncompressible vessels left leg.  Vascular team has consulted. Pending consult for orthopedic surgery. Rocephin and Zyvox currently.  Plan for left transmetatarsal amputation in the AM.   Wound type: Neuropathic  Pressure Injury POA: NA Measurement: plantar foot:  1 cm round lesion Left great toe site: 0.5 cm x 0.4 cm  Left second metatarsal, 0.5 cm lesion to medial aspect with 1 cm raised erythematous lesion to dorsal toe. Dorsal foot at toe junction is erythematous, warm and noted peeling epithelium Wound bed: devitalized, gray tissue Drainage (amount, consistency, odor) minimal serosanguinous  musty odor.  Periwound: erythema, peeling epithelium Dressing procedure/placement/frequency: Cleanse wounds to left foot/toes with NS and pat dry. Apply Alginate between toes and to open wounds to absorb exudate.  [facility] # [id number]) Cover with dry dressing secure with tape.  Change daily.  Awaiting surgery.  Will not follow at this time.  Please re-consult if needed.  Domenic Moras MSN, RN, FNP-BC CWON Wound, Ostomy, Continence Nurse Pager [phone]

## 2021-11-11 NOTE — Progress Notes (Addendum)
  Progress Note    11/11/2021 9:40 AM * No surgery date entered *  Subjective:  no complaints   Vitals:   11/11/21 0739 11/11/21 0802  BP: 128/71 126/63  Pulse: 76 75  Resp: 18 20  Temp: 98.4 F (36.9 C) 98.5 F (36.9 C)  SpO2: 95% 98%   Physical Exam: Lungs:  non labored Extremities:  L foot pictured below Neurologic: A&O     CBC    Component Value Date/Time   WBC 10.0 11/11/2021 0316   RBC 3.12 (L) 11/11/2021 0316   HGB 6.8 (LL) 11/11/2021 0316   HGB 12.3 (L) 05/30/2017 1531   HCT 21.8 (L) 11/11/2021 0316   HCT 37.7 05/30/2017 1531   PLT 266 11/11/2021 0316   PLT 212 05/30/2017 1531   MCV 69.9 (L) 11/11/2021 0316   MCV 86 05/30/2017 1531   MCH 21.8 (L) 11/11/2021 0316   MCHC 31.2 11/11/2021 0316   RDW 20.5 (H) 11/11/2021 0316   RDW 16.0 (H) 05/30/2017 1531   LYMPHSABS 1.1 11/10/2021 2216   MONOABS 2.2 (H) 11/10/2021 2216   EOSABS 0.1 11/10/2021 2216   BASOSABS 0.1 11/10/2021 2216    BMET    Component Value Date/Time   NA 135 11/11/2021 0316   NA 139 05/30/2017 1531   K 4.4 11/11/2021 0316   CL 103 11/11/2021 0316   CO2 23 11/11/2021 0316   GLUCOSE 138 (H) 11/11/2021 0316   BUN 26 (H) 11/11/2021 0316   BUN 13 05/30/2017 1531   CREATININE 2.61 (H) 11/11/2021 0316   CREATININE 1.00 01/04/2018 1401   CALCIUM 7.9 (L) 11/11/2021 0316   GFRNONAA 23 (L) 11/11/2021 0316   GFRNONAA >60 01/04/2018 1401   GFRAA >60 12/04/2019 1416   GFRAA >60 01/04/2018 1401    INR    Component Value Date/Time   INR 1.3 (H) 09/26/2019 0826     Intake/Output Summary (Last 24 hours) at 11/11/2021 0940 Last data filed at 11/11/2021 0048 Gross per 24 hour  Intake 392.34 ml  Output --  Net 392.34 ml     Assessment/Plan:  88 y.o. male with ischemic tissue changes of L foot  L LE arterial duplex negative for hemodynamically significant stenosis Plan is for L TMA tomorrow 8/18 with Dr. Trula Slade Agree with IV antibiotics NPO past midnight Consent  ordered   Dagoberto Ligas, PA-C [facility] [phone] 11/11/2021 9:40 AM   Through with the above.  Have seen and evaluated the patient.  I discussed proceeding with transmetatarsal amputation tomorrow.  He should continue with IV antibiotics.  His anticoagulation should be held.  [name]

## 2021-11-11 NOTE — Plan of Care (Signed)
  Problem: Education: Goal: Ability to describe self-care measures that may prevent or decrease complications (Diabetes Survival Skills Education) will improve Outcome: Progressing Goal: Individualized Educational Video(s) Outcome: Progressing   Problem: Coping: Goal: Ability to adjust to condition or change in health will improve Outcome: Progressing   Problem: Activity: Goal: Risk for activity intolerance will decrease Outcome: Progressing   Problem: Safety: Goal: Ability to remain free from injury will improve Outcome: Progressing

## 2021-11-11 NOTE — Progress Notes (Signed)
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  Patient has developed wound which has been spreading from second toe to the surrounding areas associated with foul-smelling discharge.  Patient reports diabetic neuropathy and was prescribed Bactrim and been taking it for last 10 days.  Patient was seen by Dr. Trula Slade in the ED,  recommended TMA amputation tomorrow.  Assessment & Plan:   Principal Problem:   Gangrene of left foot (HCC) Active Problems:   Acute kidney injury superimposed on chronic kidney disease (HCC)   Chronic diastolic CHF (congestive heart failure) (HCC)   Essential hypertension   CAD S/P percutaneous coronary angioplasty   PAF (paroxysmal atrial fibrillation) (HCC)   Type 2 diabetes mellitus (HCC)  Gangrene of left foot: Non healing Diabetic foot wound in the setting of diabetic neuropathy: Patient presented with left foot infection with diabetic foot ulcers, wet gangrene, surrounding cellulitis. MRI concerning for osteomyelitis. Patient was prescribed Bactrim and has been taking it for last 1 week without any improvement. Patient was seen by Dr. Trula Slade (vascular surgeon) in the ED. Patient will need left TMA (transmetatarsal amputation )scheduled for tomorrow. Holding Eliquis. Continue IV antibiotics  (Rocephin + Flagyl + linezolid) for the moment. Patient does have history of MRSA infection of the foot in 9/22.  Wound care consult.  AKI on CKD stage IIIb: Baseline serum creatinine 1.2 -1.58.  Creatinine on admission 2.63 Likely due to infection, decrease p.o. intake and recent antibiotic use. Avoid nephrotoxic medications / Hold Lasix and  lisinopril Continue IV hydration.  Renal functions improving. Consider nephro consult if serum creatinine trending up.  Chronic diastolic CHF: Appears euvolemic, not in any fluid overload. Continue Imdur 60 mg daily Hold Lasix and lisinopril given AKI. Last echo in 12/16/2019 showed LVEF 65 to 70%. Obtain 2D echocardiogram.  Type 2 diabetes: Hold metformin, Continue 20 units of Lantus twice daily Continue moderate scale sliding scale  Paroxysmal atrial fibrillation : Heart rate is well controlled.   Hold Eliquis for now   CAD : Continue Lipitor 80 mg daily,  Hold Eliquis and Plavix in the setting of foot gangrene requiring TMA   Essential hypertension: Continue Imdur, hold lisinopril in the setting of AKI.  Microcytic hypochromic anemia: Baseline Hb remains between 8.5-8.9. He presented with Hb 6.8 requiring 1 unit of PRBC. No signs of any visible / Obvious bleeding.  DVT prophylaxis:  Heparin Code Status:Full code. Family Communication: No family at bedside Disposition Plan:   Status is: Inpatient Remains inpatient appropriate because:  Admitted for gangrenous left foot requiring TMA amputation tomorrow.   Consultants:  Vascular surgery  Procedures: Scheduled TMA tomorrow Antimicrobials:  Anti-infectives (From admission, onward)    Start     Dose/Rate Route Frequency Ordered Stop   11/11/21 2300  vancomycin (VANCOREADY) IVPB 1250 mg/250 mL  Status:  Discontinued        1,250 mg 166.7 mL/hr over 90 Minutes Intravenous Every 48 hours 11/10/21 2146 11/10/21 2207   11/10/21 2215  linezolid (ZYVOX) tablet 600 mg  Status:  Discontinued        600 mg Oral Every 12 hours 11/10/21 2208 11/10/21 2210   11/10/21 2215  linezolid (ZYVOX) IVPB 600 mg        600 mg 300 mL/hr over 60 Minutes Intravenous Every 12 hours 11/10/21 2210     11/10/21 2200  metroNIDAZOLE (FLAGYL) tablet 500 mg        500 mg Oral Every 12 hours 11/10/21 2138 11/17/21 2159   11/10/21 2200  vancomycin  (VANCOREADY) IVPB 1500 mg/300 mL  Status:  Discontinued        1,500 mg 150 mL/hr over 120 Minutes Intravenous  Once 11/10/21 2146 11/10/21 2207   11/10/21 2145  cefTRIAXone (ROCEPHIN) 2 g in sodium chloride 0.9 % 100 mL IVPB        2 g 200 mL/hr over 30 Minutes Intravenous Every 24 hours 11/10/21 2138 11/17/21 2144      Subjective: Patient was seen and examined at bedside.  Overnight events noted.   Patient reports not having any pain but reports left foot numbness bad and drains foul-smelling discharge.   Patient is scheduled for left tarsal metatarsal amputation tomorrow.  Objective: Vitals:   11/11/21 0513 11/11/21 0739 11/11/21 0802 11/11/21 1016  BP:  128/71 126/63 138/75  Pulse:  76 75 74  Resp:  18 20 (!) 23  Temp: 98.4 F (36.9 C) 98.4 F (36.9 C) 98.5 F (36.9 C) 98.6 F (37 C)  TempSrc: Oral Oral Oral Oral  SpO2:  95% 98%   Weight:      Height:        Intake/Output Summary (Last 24 hours) at 11/11/2021 1054 Last data filed at 11/11/2021 1018 Gross per 24 hour  Intake 707.34 ml  Output 800 ml  Net -92.66 ml   Filed Weights   11/10/21 2344  Weight: 82.3 kg    Examination:  General exam: Appears comfortable, not in any acute distress.  Deconditioned. Respiratory system: CTA bilaterally, no wheezing, no crackles, normal respiratory effort. Cardiovascular system: S1 & S2 heard, regular rate and rhythm, no murmur. Gastrointestinal system: Abdomen is soft, non tender, non distended, BS+ Central nervous system: Alert and oriented x3. No focal neurological deficits. Extremities: Right foot amputated toes, left foot amputated big toe, blackening of the toes on the medial side. Skin: No rashes, lesions or ulcers Psychiatry: Judgement and insight appear normal. Mood & affect appropriate.     Data Reviewed: I have personally reviewed following labs and imaging studies  CBC: Recent Labs  Lab 11/10/21 1341 11/10/21 2216 11/11/21 0316  WBC 14.4* 12.1* 10.0   NEUTROABS  --  8.2*  --   HGB 7.7* 7.6* 6.8*  HCT 24.7* 24.4* 21.8*  MCV 68.6* 69.7* 69.9*  PLT 441* 396 144   Basic Metabolic Panel: Recent Labs  Lab 11/10/21 1341 11/11/21 0316  NA 135 135  K 4.9 4.4  CL 100 103  CO2 25 23  GLUCOSE 110* 138*  BUN 25* 26*  CREATININE 2.63* 2.61*  CALCIUM 8.5* 7.9*   GFR: Estimated Creatinine Clearance: 20.9 mL/min (A) (by C-G formula based on SCr of 2.61 mg/dL (H)). Liver Function Tests: No results for input(s): "AST", "ALT", "ALKPHOS", "BILITOT", "PROT", "ALBUMIN" in the last 168 hours. No results for input(s): "LIPASE", "AMYLASE" in the last 168 hours. No results for input(s): "AMMONIA" in the last 168 hours. Coagulation Profile: No results for input(s): "INR", "PROTIME" in the last 168 hours. Cardiac Enzymes: No results for input(s): "CKTOTAL", "CKMB", "CKMBINDEX", "TROPONINI" in the last 168 hours. BNP (last 3 results) No results for input(s): "PROBNP" in the last 8760 hours. HbA1C: Recent Labs  11/10/21 2216  HGBA1C 6.5*   CBG: Recent Labs  Lab 11/10/21 2216 11/10/21 2349 11/11/21 0730  GLUCAP 72 154* 85   Lipid Profile: No results for input(s): "CHOL", "HDL", "LDLCALC", "TRIG", "CHOLHDL", "LDLDIRECT" in the last 72 hours. Thyroid Function Tests: No results for input(s): "TSH", "T4TOTAL", "FREET4", "T3FREE", "THYROIDAB" in the last 72 hours. Anemia Panel: No results for input(s): "VITAMINB12", "FOLATE", "FERRITIN", "TIBC", "IRON", "RETICCTPCT" in the last 72 hours. Sepsis Labs: Recent Labs  Lab 11/10/21 1341 11/10/21 2218  LATICACIDVEN 1.5 1.1    No results found for this or any previous visit (from the past 240 hour(s)).       Radiology Studies: MR FOOT LEFT WO CONTRAST  Result Date: 11/10/2021 CLINICAL DATA:  Osteomyelitis of the foot suspected. New wound with ischemic changes and order to the left foot. EXAM: MRI OF THE LEFT FOOT WITHOUT CONTRAST TECHNIQUE: Multiplanar, multisequence MR imaging of the  left was performed. No intravenous contrast was administered. COMPARISON:  Radiographs dated November 10, 2021. FINDINGS: Bones/Joint/Cartilage Postsurgical changes for prior amputation of the first ray through the mid first metatarsal. There is bone marrow edema in the distal half of the second metatarsal and in the proximal phalanx of the second metatarsal. Superior subluxation of the second metatarsal at the metatarsophalangeal joint. Marrow edema at the base of the proximal phalanx of the third digit. Ligaments Lisfranc ligament appear intact. Evaluation of collateral ligaments is limited due to motion. Muscles and Tendons Increased intramuscular signal of the plantar muscles secondary to diabetic myopathy/myositis. No appreciable fluid collection. Soft tissues There is soft tissue edema and open wound on the plantar aspect of the second digit with surrounding phlegmonous changes. There is soft tissue edema and likely small amount of free air on the dorsal aspect of the third digit. Soft tissue swelling about the dorsum of the foot. IMPRESSION: 1. Bone marrow edema of the distal half of the second metatarsal and proximal phalanx of the second digit with subluxation at the metatarsophalangeal joint, which in the presence of adjacent deep skin wound phlegmonous changes is consistent with osteomyelitis with septic arthritis. 2. Bone marrow edema of the proximal phalanx of the third digit with surrounding edema and inflammatory changes also concerning for osteomyelitis. 3. Deep skin wound and phlegmonous changes about the plantar aspect of the second digit as well as inflammatory changes about the third proximal phalanx suggesting phlegmonous process. Clinical correlation is suggested, 4.  Additional chronic findings as above. Electronically Signed   By: Keane Police D.O.   On: 11/10/2021 17:59   VAS [place] ABI WITH/WO TBI  Result Date: 11/10/2021  LOWER EXTREMITY DOPPLER STUDY Patient Name:  JERROL HELMERS  Date of  Exam:   11/10/2021 Medical Rec #: [id number]        Accession #:    [id number] Date of Birth: 12/05/1933        Patient Gender: M Patient Age:   73 years Exam Location:  [facility] Procedure:      VAS [place] ABI WITH/WO TBI Referring Phys: CHRISTIAN PROSPERI --------------------------------------------------------------------------------  Indications: Gangrene. right transmetatarsal amputation and Left great and              second toes. High Risk Factors: Hypertension, hyperlipidemia, Diabetes, past history of                    smoking, coronary artery disease.  Comparison Study: 05/17/21 at outside facility RT 0.7, LT 1.03 Performing Technologist: Bobetta Lime BS RVT  Examination Guidelines: A complete evaluation includes at minimum, Doppler waveform signals and systolic blood pressure reading at the level of bilateral brachial, anterior tibial, and posterior tibial arteries, when vessel segments are accessible. Bilateral testing is considered an integral part of a complete examination. Photoelectric Plethysmograph (PPG) waveforms and toe systolic pressure readings are included as required and additional duplex testing as needed. Limited examinations for reoccurring indications may be performed as noted.  ABI Findings: +---------+------------------+-----+---------+----------+ Right    Rt Pressure (mmHg)IndexWaveform Comment    +---------+------------------+-----+---------+----------+ Brachial 119                    triphasic           +---------+------------------+-----+---------+----------+ PTA      128               1.07 triphasic           +---------+------------------+-----+---------+----------+ DP       118               0.98 triphasic           +---------+------------------+-----+---------+----------+ Great Toe                                Amputation +---------+------------------+-----+---------+----------+ +---------+------------------+-----+----------+----------+ Left      Lt Pressure (mmHg)IndexWaveform  Comment    +---------+------------------+-----+----------+----------+ Brachial 120                    triphasic            +---------+------------------+-----+----------+----------+ PTA      121               1.01 biphasic             +---------+------------------+-----+----------+----------+ DP       92                0.77 monophasic           +---------+------------------+-----+----------+----------+ Great Toe                                 Amputation +---------+------------------+-----+----------+----------+ +-------+-----------+-----------+------------+------------+ ABI/TBIToday's ABIToday's TBIPrevious ABIPrevious TBI +-------+-----------+-----------+------------+------------+ Right  108                   .7                       +-------+-----------+-----------+------------+------------+ Left   1.01                  1.03                     +-------+-----------+-----------+------------+------------+ Right ABIs appear increased. Left ABIs appear essentially unchanged. Right transmetatarsal amputation, left great toe and second toe amputation  Summary: Right: Resting right ankle-brachial index is within normal range. Left: Resting left ankle-brachial index indicates noncompressible left lower extremity arteries. *See table(s) above for measurements and observations.  Electronically signed by Jamelle Haring on 11/10/2021 at 5:27:22 PM.    Final    VAS [place] LOWER EXTREMITY ARTERIAL DUPLEX (7a-7p)  Result Date: 11/10/2021 LOWER EXTREMITY ARTERIAL DUPLEX STUDY Patient Name:  TANYON ALIPIO  Date of Exam:   11/10/2021 Medical Rec #: [id number]        Accession #:    [id number] Date of Birth: 02-13-1934  Patient Gender: M Patient Age:   52 years Exam Location:  [facility] Procedure:      VAS [place] LOWER EXTREMITY ARTERIAL DUPLEX Referring Phys: CHRISTIAN PROSPERI  --------------------------------------------------------------------------------  Indications: Gangrene. High Risk Factors: Hypertension, hyperlipidemia, Diabetes, past history of                    smoking, coronary artery disease.  Current ABI: 1.01 Comparison Study: No previous left LE arterial duplex noted. Performing Technologist: Bobetta Lime BS, RVT  Examination Guidelines: A complete evaluation includes B-mode imaging, spectral Doppler, color Doppler, and power Doppler as needed of all accessible portions of each vessel. Bilateral testing is considered an integral part of a complete examination. Limited examinations for reoccurring indications may be performed as noted.   +----------+--------+-----+--------+----------+--------+ LEFT      PSV cm/sRatioStenosisWaveform  Comments +----------+--------+-----+--------+----------+--------+ CIA Distal                     triphasic          +----------+--------+-----+--------+----------+--------+ CFA Distal243                  triphasic          +----------+--------+-----+--------+----------+--------+ DFA       114                  triphasic          +----------+--------+-----+--------+----------+--------+ SFA Prox  209                  triphasic          +----------+--------+-----+--------+----------+--------+ SFA Mid   128                  triphasic          +----------+--------+-----+--------+----------+--------+ SFA Distal111                  triphasic          +----------+--------+-----+--------+----------+--------+ POP Prox  97                   triphasic          +----------+--------+-----+--------+----------+--------+ POP Distal74                   triphasic          +----------+--------+-----+--------+----------+--------+ TP Trunk  108                  triphasic          +----------+--------+-----+--------+----------+--------+ ATA Prox  96                   biphasic            +----------+--------+-----+--------+----------+--------+ ATA Mid   89                   biphasic           +----------+--------+-----+--------+----------+--------+ ATA Distal41                   biphasic           +----------+--------+-----+--------+----------+--------+ PTA Prox  111                  biphasic           +----------+--------+-----+--------+----------+--------+ PTA Mid   193                  biphasic           +----------+--------+-----+--------+----------+--------+  PTA Distal120                  biphasic           +----------+--------+-----+--------+----------+--------+ DP        43                   monophasic         +----------+--------+-----+--------+----------+--------+  Summary: Left: No hemodynamically significant stenoses noted.  See table(s) above for measurements and observations. Electronically signed by Jamelle Haring on 11/10/2021 at 5:27:04 PM.    Final    DG Foot 2 Views Left  Result Date: 11/10/2021 CLINICAL DATA:  88 year old male with concern for osteomyelitis. EXAM: LEFT FOOT - 2 VIEW COMPARISON:  02/02/2020 FINDINGS: Status post amputation of the mid first metatarsal. Dorsal subluxation of the second metatarsophalangeal joint. Subcutaneous emphysema in the dorsal and medial aspect of the second toe. No definite cortical abnormality, however limited evaluation with only two views. Mild plantar calcaneal enthesopathy. IMPRESSION: Soft tissue emphysema about the medial and dorsal aspect of the second toe without definite underlying osseous involvement, however this is a limited study. Consider MRI of the foot for further characterization as clinically indicated. Electronically Signed   By: Ruthann Cancer M.D.   On: 11/10/2021 14:07     Scheduled Meds:  sodium chloride   Intravenous Once   atorvastatin  80 mg Oral QPM   insulin aspart  0-15 Units Subcutaneous TID WC   insulin aspart  0-5 Units Subcutaneous QHS   insulin glargine-yfgn   20 Units Subcutaneous BID   isosorbide mononitrate  60 mg Oral BID   metroNIDAZOLE  500 mg Oral Q12H   Continuous Infusions:  cefTRIAXone (ROCEPHIN)  IV Stopped (11/10/21 2241)   linezolid (ZYVOX) IV Stopped (11/11/21 0048)     LOS: 1 day    Time spent: 50 mins    Ithzel Fedorchak, MD [facility]   If 7PM-7AM, please contact night-coverage

## 2021-11-12 ENCOUNTER — Inpatient Hospital Stay (HOSPITAL_COMMUNITY): Payer: Medicare HMO | Admitting: Anesthesiology

## 2021-11-12 ENCOUNTER — Encounter (HOSPITAL_COMMUNITY): Payer: Self-pay | Admitting: Internal Medicine

## 2021-11-12 ENCOUNTER — Encounter (HOSPITAL_COMMUNITY)
Admission: EM | Disposition: A | Discharge status: Home Health | Payer: Self-pay | Source: Home / Self Care | Attending: Family Medicine

## 2021-11-12 ENCOUNTER — Other Ambulatory Visit: Payer: Self-pay

## 2021-11-12 DIAGNOSIS — E1152 Type 2 diabetes mellitus with diabetic peripheral angiopathy with gangrene: Secondary | ICD-10-CM | POA: Diagnosis not present

## 2021-11-12 DIAGNOSIS — E039 Hypothyroidism, unspecified: Secondary | ICD-10-CM | POA: Diagnosis not present

## 2021-11-12 DIAGNOSIS — Z7984 Long term (current) use of oral hypoglycemic drugs: Secondary | ICD-10-CM

## 2021-11-12 DIAGNOSIS — I96 Gangrene, not elsewhere classified: Secondary | ICD-10-CM | POA: Diagnosis not present

## 2021-11-12 DIAGNOSIS — E440 Moderate protein-calorie malnutrition: Secondary | ICD-10-CM | POA: Insufficient documentation

## 2021-11-12 DIAGNOSIS — S91302A Unspecified open wound, left foot, initial encounter: Secondary | ICD-10-CM | POA: Diagnosis not present

## 2021-11-12 DIAGNOSIS — Z87891 Personal history of nicotine dependence: Secondary | ICD-10-CM

## 2021-11-12 HISTORY — PX: TRANSMETATARSAL AMPUTATION: SHX6197

## 2021-11-12 LAB — BPAM RBC
Blood Product Expiration Date: 202309142359
ISSUE DATE / TIME: 202308170728
Unit Type and Rh: 5100

## 2021-11-12 LAB — CBC
HCT: 24 % — ABNORMAL LOW (ref 39.0–52.0)
Hemoglobin: 7.7 g/dL — ABNORMAL LOW (ref 13.0–17.0)
MCH: 23 pg — ABNORMAL LOW (ref 26.0–34.0)
MCHC: 32.1 g/dL (ref 30.0–36.0)
MCV: 71.6 fL — ABNORMAL LOW (ref 80.0–100.0)
Platelets: 250 10*3/uL (ref 150–400)
RBC: 3.35 MIL/uL — ABNORMAL LOW (ref 4.22–5.81)
RDW: 21.2 % — ABNORMAL HIGH (ref 11.5–15.5)
WBC: 14.6 10*3/uL — ABNORMAL HIGH (ref 4.0–10.5)
nRBC: 0 % (ref 0.0–0.2)

## 2021-11-12 LAB — TYPE AND SCREEN
ABO/RH(D): O POS
Antibody Screen: NEGATIVE
Unit division: 0

## 2021-11-12 LAB — BASIC METABOLIC PANEL
Anion gap: 8 (ref 5–15)
BUN: 21 mg/dL (ref 8–23)
CO2: 21 mmol/L — ABNORMAL LOW (ref 22–32)
Calcium: 7.9 mg/dL — ABNORMAL LOW (ref 8.9–10.3)
Chloride: 105 mmol/L (ref 98–111)
Creatinine, Ser: 2.17 mg/dL — ABNORMAL HIGH (ref 0.61–1.24)
GFR, Estimated: 29 mL/min — ABNORMAL LOW (ref >60)
Glucose, Bld: 121 mg/dL — ABNORMAL HIGH (ref 70–99)
Potassium: 4.1 mmol/L (ref 3.5–5.1)
Sodium: 134 mmol/L — ABNORMAL LOW (ref 135–145)

## 2021-11-12 LAB — GLUCOSE, CAPILLARY
Glucose-Capillary: 118 mg/dL — ABNORMAL HIGH (ref 70–99)
Glucose-Capillary: 98 mg/dL (ref 70–99)
Glucose-Capillary: 132 mg/dL — ABNORMAL HIGH (ref 70–99)
Glucose-Capillary: 153 mg/dL — ABNORMAL HIGH (ref 70–99)
Glucose-Capillary: 340 mg/dL — ABNORMAL HIGH (ref 70–99)

## 2021-11-12 LAB — MAGNESIUM: Magnesium: 2.2 mg/dL (ref 1.7–2.4)

## 2021-11-12 LAB — MRSA NEXT GEN BY PCR, NASAL: MRSA by PCR Next Gen: NOT DETECTED

## 2021-11-12 LAB — PHOSPHORUS: Phosphorus: 3.6 mg/dL (ref 2.5–4.6)

## 2021-11-12 SURGERY — AMPUTATION, FOOT, TRANSMETATARSAL
Anesthesia: Monitor Anesthesia Care | Site: Toe | Laterality: Left

## 2021-11-12 MED ORDER — CHLORHEXIDINE GLUCONATE 0.12 % MT SOLN
OROMUCOSAL | Status: AC
  Administered 2021-11-12: 15 mL
  Filled 2021-11-12: qty 15

## 2021-11-12 MED ORDER — ENSURE ENLIVE PO LIQD
237.0000 mL | Freq: Two times a day (BID) | ORAL | Status: DC
  Administered 2021-11-13 – 2021-11-16 (×6): 237 mL via ORAL

## 2021-11-12 MED ORDER — PROPOFOL 10 MG/ML IV BOLUS
INTRAVENOUS | Status: DC | PRN
  Administered 2021-11-12: 20 mg via INTRAVENOUS

## 2021-11-12 MED ORDER — LIDOCAINE 2% (20 MG/ML) 5 ML SYRINGE
INTRAMUSCULAR | Status: DC | PRN
  Administered 2021-11-12: 60 mg via INTRAVENOUS

## 2021-11-12 MED ORDER — SODIUM CHLORIDE 0.9 % IV SOLN: INTRAVENOUS | Status: DC

## 2021-11-12 MED ORDER — LIDOCAINE HCL (PF) 1 % IJ SOLN
INTRAMUSCULAR | Status: AC
  Filled 2021-11-12: qty 30

## 2021-11-12 MED ORDER — ADULT MULTIVITAMIN W/MINERALS CH
1.0000 | ORAL_TABLET | Freq: Every day | ORAL | Status: DC
  Administered 2021-11-12 – 2021-11-16 (×5): 1 via ORAL
  Filled 2021-11-12 (×3): qty 1

## 2021-11-12 MED ORDER — ROPIVACAINE HCL 5 MG/ML IJ SOLN
INTRAMUSCULAR | Status: DC | PRN
  Administered 2021-11-12: 10 mL via PERINEURAL
  Administered 2021-11-12: 15 mL via PERINEURAL

## 2021-11-12 MED ORDER — FENTANYL CITRATE (PF) 250 MCG/5ML IJ SOLN
INTRAMUSCULAR | Status: DC | PRN
  Administered 2021-11-12: 25 ug via INTRAVENOUS

## 2021-11-12 MED ORDER — LIDOCAINE 2% (20 MG/ML) 5 ML SYRINGE
INTRAMUSCULAR | Status: AC
  Filled 2021-11-12: qty 5

## 2021-11-12 MED ORDER — FENTANYL CITRATE (PF) 250 MCG/5ML IJ SOLN
INTRAMUSCULAR | Status: AC
  Filled 2021-11-12: qty 5

## 2021-11-12 MED ORDER — PROPOFOL 500 MG/50ML IV EMUL
INTRAVENOUS | Status: DC | PRN
  Administered 2021-11-12: 75 ug/kg/min via INTRAVENOUS

## 2021-11-12 MED ORDER — EPHEDRINE 5 MG/ML INJ
INTRAVENOUS | Status: AC
  Filled 2021-11-12: qty 5

## 2021-11-12 MED ORDER — EPHEDRINE SULFATE-NACL 50-0.9 MG/10ML-% IV SOSY
PREFILLED_SYRINGE | INTRAVENOUS | Status: DC | PRN
  Administered 2021-11-12: 10 mg via INTRAVENOUS
  Administered 2021-11-12 (×2): 5 mg via INTRAVENOUS

## 2021-11-12 MED ORDER — DEXAMETHASONE SODIUM PHOSPHATE 10 MG/ML IJ SOLN
INTRAMUSCULAR | Status: DC | PRN
  Administered 2021-11-12: 2 mg
  Administered 2021-11-12: 3 mg

## 2021-11-12 MED ORDER — FENTANYL CITRATE (PF) 100 MCG/2ML IJ SOLN: 100.0000 ug | Freq: Once | INTRAMUSCULAR | Status: AC

## 2021-11-12 MED ORDER — ONDANSETRON HCL 4 MG/2ML IJ SOLN
INTRAMUSCULAR | Status: AC
  Filled 2021-11-12: qty 2

## 2021-11-12 MED ORDER — JUVEN PO PACK
1.0000 | PACK | Freq: Two times a day (BID) | ORAL | Status: DC
  Administered 2021-11-12 – 2021-11-16 (×8): 1 via ORAL
  Filled 2021-11-12 (×8): qty 1

## 2021-11-12 MED ORDER — ONDANSETRON HCL 4 MG/2ML IJ SOLN
INTRAMUSCULAR | Status: DC | PRN
  Administered 2021-11-12: 4 mg via INTRAVENOUS

## 2021-11-12 MED ORDER — TRAZODONE HCL 50 MG PO TABS
150.0000 mg | ORAL_TABLET | Freq: Every day | ORAL | Status: DC
  Administered 2021-11-12 – 2021-11-15 (×5): 150 mg via ORAL
  Filled 2021-11-12 (×5): qty 3

## 2021-11-12 MED ORDER — PROPOFOL 10 MG/ML IV BOLUS
INTRAVENOUS | Status: AC
  Filled 2021-11-12: qty 20

## 2021-11-12 MED ORDER — FENTANYL CITRATE (PF) 100 MCG/2ML IJ SOLN
INTRAMUSCULAR | Status: AC
  Administered 2021-11-12: 100 ug via INTRAVENOUS
  Filled 2021-11-12: qty 2

## 2021-11-12 MED ORDER — 0.9 % SODIUM CHLORIDE (POUR BTL) OPTIME
TOPICAL | Status: DC | PRN
  Administered 2021-11-12: 1000 mL

## 2021-11-12 SURGICAL SUPPLY — 41 items
BAG COUNTER SPONGE SURGICOUNT (BAG) ×1 IMPLANT
BLADE AVERAGE 25X9 (BLADE) ×1 IMPLANT
BLADE SAW SGTL 81X20 HD (BLADE) IMPLANT
BNDG CONFORM 3 STRL LF (GAUZE/BANDAGES/DRESSINGS) IMPLANT
BNDG ELASTIC 4X5.8 VLCR STR LF (GAUZE/BANDAGES/DRESSINGS) ×1 IMPLANT
BNDG ELASTIC 6X10 VLCR STRL LF (GAUZE/BANDAGES/DRESSINGS) IMPLANT
BNDG GAUZE DERMACEA FLUFF (GAUZE/BANDAGES/DRESSINGS) ×2
BNDG GAUZE DERMACEA FLUFF 4 (GAUZE/BANDAGES/DRESSINGS) IMPLANT
BNDG GAUZE ELAST 4 BULKY (GAUZE/BANDAGES/DRESSINGS) ×1 IMPLANT
CANISTER SUCT 3000ML PPV (MISCELLANEOUS) ×1 IMPLANT
CLIP LIGATING EXTRA MED SLVR (CLIP) ×1 IMPLANT
CLIP LIGATING EXTRA SM BLUE (MISCELLANEOUS) ×1 IMPLANT
COVER SURGICAL LIGHT HANDLE (MISCELLANEOUS) ×1 IMPLANT
DRAPE HALF SHEET 40X57 (DRAPES) ×1 IMPLANT
ELECT REM PT RETURN 9FT ADLT (ELECTROSURGICAL) ×1
ELECTRODE REM PT RTRN 9FT ADLT (ELECTROSURGICAL) ×1 IMPLANT
GAUZE SPONGE 4X4 12PLY STRL (GAUZE/BANDAGES/DRESSINGS) ×1 IMPLANT
GLOVE BIO SURGEON STRL SZ7.5 (GLOVE) ×1 IMPLANT
GLOVE BIOGEL PI IND STRL 6.5 (GLOVE) IMPLANT
GLOVE BIOGEL PI INDICATOR 6.5 (GLOVE) ×3
GLOVE SURG SS PI 7.5 STRL IVOR (GLOVE) IMPLANT
GOWN STRL REUS W/ TWL LRG LVL3 (GOWN DISPOSABLE) ×2 IMPLANT
GOWN STRL REUS W/ TWL XL LVL3 (GOWN DISPOSABLE) ×1 IMPLANT
GOWN STRL REUS W/TWL LRG LVL3 (GOWN DISPOSABLE) ×2
GOWN STRL REUS W/TWL XL LVL3 (GOWN DISPOSABLE) ×1
KIT BASIN OR (CUSTOM PROCEDURE TRAY) ×1 IMPLANT
KIT TURNOVER KIT B (KITS) ×1 IMPLANT
NDL HYPO 25GX1X1/2 BEV (NEEDLE) IMPLANT
NEEDLE HYPO 25GX1X1/2 BEV (NEEDLE) IMPLANT
NS IRRIG 1000ML POUR BTL (IV SOLUTION) ×1 IMPLANT
PACK GENERAL/GYN (CUSTOM PROCEDURE TRAY) ×1 IMPLANT
PAD ARMBOARD 7.5X6 YLW CONV (MISCELLANEOUS) ×2 IMPLANT
SPECIMEN JAR SMALL (MISCELLANEOUS) ×1 IMPLANT
STAPLER VISISTAT 35W (STAPLE) IMPLANT
SUT ETHILON 3 0 PS 1 (SUTURE) ×1 IMPLANT
SUT VIC AB 2-0 CT1 18 (SUTURE) IMPLANT
SYR CONTROL 10ML LL (SYRINGE) IMPLANT
TOWEL GREEN STERILE (TOWEL DISPOSABLE) ×2 IMPLANT
TOWEL GREEN STERILE FF (TOWEL DISPOSABLE) ×1 IMPLANT
UNDERPAD 30X36 HEAVY ABSORB (UNDERPADS AND DIAPERS) ×1 IMPLANT
WATER STERILE IRR 1000ML POUR (IV SOLUTION) ×1 IMPLANT

## 2021-11-12 NOTE — Anesthesia Procedure Notes (Signed)
Procedure Name: MAC Date/Time: 11/12/2021 1:25 PM  Performed by: Trinna Post., CRNAPre-anesthesia Checklist: Patient identified and Emergency Drugs available Patient Re-evaluated:Patient Re-evaluated prior to induction Oxygen Delivery Method: Simple face mask Preoxygenation: Pre-oxygenation with 100% oxygen Induction Type: IV induction Placement Confirmation: positive ETCO2

## 2021-11-12 NOTE — Anesthesia Procedure Notes (Signed)
Anesthesia Regional Block: Popliteal block   Pre-Anesthetic Checklist: , timeout performed,  Correct Patient, Correct Site, Correct Laterality,  Correct Procedure, Correct Position, site marked,  Risks and benefits discussed,  Surgical consent,  Pre-op evaluation,  At surgeon's request and post-op pain management  Laterality: Left  Prep: chloraprep       Needles:  Injection technique: Single-shot  Needle Type: Echogenic Stimulator Needle          Additional Needles:   Procedures:,,,, ultrasound used (permanent image in chart),,    Narrative:  Start time: 11/12/2021 12:41 PM End time: 11/12/2021 12:51 PM Injection made incrementally with aspirations every 5 mL.  Performed by: Personally  Anesthesiologist: Duane Boston, MD  Additional Notes: A functioning IV was confirmed and monitors were applied.  Sterile prep and drape, hand hygiene and sterile gloves were used.  Negative aspiration and test dose prior to incremental administration of local anesthetic. The patient tolerated the procedure well.Ultrasound  guidance: relevant anatomy identified, needle position confirmed, local anesthetic spread visualized around nerve(s), vascular puncture avoided.  Image printed for medical record. ACB Supplement.

## 2021-11-12 NOTE — Op Note (Signed)
    Patient name: Mike Wright MRN: [id number] DOB: 1934-03-12 Sex: male  11/12/2021 Pre-operative Diagnosis: left foot wound with toe gangrene Post-operative diagnosis:  Same Surgeon:  Erlene Quan C. Donzetta Matters, MD Assistant: Laurence Slate, PA Procedure Performed: Left transmetatarsal amputation  Indications: 88 year old male with a history of left first toe amputation now with gangrenous changes of the left foot indicating transmetatarsal amputation.  Findings: All gangrenous tissue was removed and the underlying tissue appeared healthy with adequate capillary bleeding.   Procedure:  The patient was identified in the holding area and taken to the operating room was put supine operative table and MAC anesthesia was induced.  He was sterilely prepped and draped in left lower extremity usual fashion, antibiotics were administered and a timeout was called.  Preoperative block and placed.  This was tested would be noted to be intact.  A fishmouth type incision that was modified towards the end of the first toe with previous amputation site was was created and we dissected directly down to the level of the bones and the 4 metatarsal bones were divided with a power saw with the dorsal bevel.  Soft tissue was divided and the 4 toes were sent as a specimen.  Findings were smoothed with rasp.  We thoroughly irrigated the wound and to hemostasis.  We reapproximated the subcutaneous tissue with interrupted 2-0 Vicryl suture and the skin was closed with staples.  Sterile dressing was applied.  He was awakened from anesthesia having tolerated procedure without any complication.  All counts were correct at completion.  Given the complexity of the case,  the assistant was necessary in order to expedite the procedure and safely perform the technical aspects of the operation.  The assistant played a critical role in retraction for exposure of the metatarsal bones and with assistance in closing subcutaneous tissue and  skin.  EBL: 25 cc  Finnley Lewis C. Donzetta Matters, MD [facility] Office: [phone] Pager: [phone]

## 2021-11-12 NOTE — Transfer of Care (Signed)
Immediate Anesthesia Transfer of Care Note  Patient: Mike Wright  Procedure(s) Performed: LEFT TRANSMETATARSAL AMPUTATION (Left: Toe)  Patient Location: PACU  Anesthesia Type:MAC and Regional  Level of Consciousness: awake, alert  and oriented  Airway & Oxygen Therapy: Patient Spontanous Breathing and Patient connected to face mask oxygen  Post-op Assessment: Report given to RN and Post -op Vital signs reviewed and stable  Post vital signs: Reviewed and stable  Last Vitals:  Vitals Value Taken Time  BP 108/66 11/12/21 1417  Temp    Pulse 66 11/12/21 1420  Resp 20 11/12/21 1420  SpO2 100 % 11/12/21 1420  Vitals shown include unvalidated device data.  Last Pain:  Vitals:   11/12/21 1235  TempSrc:   PainSc: 0-No pain         Complications: No notable events documented.

## 2021-11-12 NOTE — Progress Notes (Signed)
Initial Nutrition Assessment  DOCUMENTATION CODES:   Non-severe (moderate) malnutrition in context of chronic illness  INTERVENTION:  Advance diet to regular after surgery, place pt on automatic trays  Ensure Enlive po BID, each supplement provides 350 kcal and 20 grams of protein. 1 packet Juven BID, each packet provides 95 calories, 2.5 grams of protein (collagen), and 9.8 grams of carbohydrate (3 grams sugar); also contains 7 grams of L-arginine and L-glutamine, 300 mg vitamin C, 15 mg vitamin E, 1.2 mcg vitamin B-12, 9.5 mg zinc, 200 mg calcium, and 1.5 g  Calcium Beta-hydroxy-Beta-methylbutyrate to support wound healing MVI with minerals daily  NUTRITION DIAGNOSIS:   Moderate Malnutrition (in the context of chronic illness) related to poor appetite as evidenced by moderate muscle depletion, moderate fat depletion, energy intake < or equal to 75% for > or equal to 1 month.  GOAL:   Patient will meet greater than or equal to 90% of their needs  MONITOR:   PO intake, Supplement acceptance, Labs, Diet advancement, Skin  REASON FOR ASSESSMENT:   Consult Wound healing  ASSESSMENT:   Pt with hx of GERD, CHF, HTN, HLD, PVD, CAD, PAF, DM type 2, and COPD presented to ED with a worsening left foot wound.  Met with pt and family in room. Currently NPO for surgery this afternoon.   Discussed usual intake. Pt normally has an [place], grits and a cup of juice for breakfast and then does not eat again until dinner (aside from peppermint candy) at which point he has something like a tomato sandwich. Pt reports that his appetite has been decreased, attributes it to food not tasting right.    No weight loss noted in chart however, suspect that current weight is not accurate and pt has significant muscle and fat depletions noted and he and his wife both endorse weight loss over the last few weeks.   Pt agreeable to MVI and nutrition supplements while admitted. Prefers chocolate.    Nutritionally Relevant Medications: Scheduled Meds:  atorvastatin  80 mg Oral QPM   insulin aspart  0-15 Units Subcutaneous TID WC   insulin aspart  0-5 Units Subcutaneous QHS   insulin glargine-yfgn  20 Units Subcutaneous BID   metroNIDAZOLE  500 mg Oral Q12H   Continuous Infusions:  sodium chloride 75 mL/hr at 11/12/21 1001   cefTRIAXone (ROCEPHIN)  IV 2 g (11/11/21 2245)   linezolid (ZYVOX) IV 600 mg (11/12/21 1015)   Labs Reviewed: Na 134 Creatinine 2.17 CBG ranges from 85-149 mg/dL over the last 24 hours HgbA1c 6.5% (8/16)  NUTRITION - FOCUSED PHYSICAL EXAM:  Flowsheet Row Most Recent Value  Orbital Region Mild depletion  Upper Arm Region Moderate depletion  Thoracic and Lumbar Region Moderate depletion  Buccal Region Mild depletion  Temple Region Moderate depletion  Clavicle Bone Region Moderate depletion  Clavicle and Acromion Bone Region Moderate depletion  Scapular Bone Region Mild depletion  Dorsal Hand Mild depletion  Patellar Region Severe depletion  Anterior Thigh Region Severe depletion  Posterior Calf Region Severe depletion  Edema (RD Assessment) None  Hair Reviewed  Eyes Reviewed  Mouth Reviewed  Skin Reviewed  [abrasions/scabs to the left leg knee area]  Nails Reviewed    Diet Order:   Diet Order             Diet NPO time specified  Diet effective midnight                   EDUCATION NEEDS:   Education  needs have been addressed  Skin:  Skin Assessment: Reviewed RN Assessment (infected wound to toes of the left foot)  Last BM:  8/17  Height:   Ht Readings from Last 1 Encounters:  11/10/21 5' 7"  (1.702 m)    Weight:   Wt Readings from Last 1 Encounters:  11/10/21 82.3 kg    Ideal Body Weight:  67.3 kg  BMI:  Body mass index is 28.41 kg/m.  Estimated Nutritional Needs:  Kcal:  1900-2100 kcal/d Protein:  100-120 g/d Fluid:  2-2.1 L/d    Ranell Patrick, RD, LDN Clinical Dietitian RD pager # available in [facility]   After hours/weekend pager # available in [facility]

## 2021-11-12 NOTE — Anesthesia Preprocedure Evaluation (Addendum)
Anesthesia Evaluation  Patient identified by MRN, date of birth, ID band Patient awake    Reviewed: Allergy & Precautions, NPO status , Patient's Chart, lab work & pertinent test results  History of Anesthesia Complications Negative for: history of anesthetic complications  Airway Mallampati: I       Dental  (+) Edentulous Upper, Upper Dentures, Dental Advisory Given, Poor Dentition   Pulmonary shortness of breath, sleep apnea (does not use CPAP) , COPD, former smoker,    Pulmonary exam normal        Cardiovascular hypertension, + CAD, + Cardiac Stents (04/2017 s/p DES to D1, DES to prox-mid LAD, DES to prox LAD overlapping the prior stent), + Peripheral Vascular Disease and +CHF  + dysrhythmias Atrial Fibrillation + Valvular Problems/Murmurs AS and AI  Rhythm:Regular Rate:Normal + Systolic murmurs Echo 3151 IMPRESSIONS    1. Left ventricular ejection fraction, by estimation, is 70 to 75%. Left  ventricular ejection fraction by PLAX is 71 %. The left ventricle has  hyperdynamic function. The left ventricle has no regional wall motion  abnormalities. Left ventricular  diastolic function could not be evaluated.  2. Right ventricular systolic function is hyperdynamic. The right  ventricular size is normal. Tricuspid regurgitation signal is inadequate  for assessing PA pressure.  3. Left atrial size was severely dilated.  4. The mitral valve is abnormal. Trivial mitral valve regurgitation.  Moderate mitral annular calcification.  5. The aortic valve is calcified. There is moderate calcification of the  aortic valve. Aortic valve regurgitation is mild. Moderate aortic valve  stenosis. Aortic valve area, by VTI measures 1.14 cm. Aortic valve mean  gradient measures 25.0 mmHg. Aortic  valve Vmax measures 3.46 m/s. Peak gradient 48 mmHg. DI is 0.33.  6. The inferior vena cava is dilated in size with >50% respiratory   variability, suggesting right atrial pressure of 8 mmHg.   Comparison(s): Changes from prior study are noted. 01/29/2020: LVEF 65-70%,  moderate AS - mean gradient 27.5 mmHg, DI 0.41.   TEE 2019 Normal EF, valves ok  LHC 2019 Dist RCA lesion is 40% stenosed. Prox RCA lesion is 25% stenosed. 1st Diag lesion is 75% stenosed. A drug-eluting stent was successfully placed using a STENT SYNERGY DES 2.25X12. Post intervention, there is a 0% residual stenosis. Prox LAD to Mid LAD lesion is 75% stenosed. A drug-eluting stent was successfully placed using a STENT SYNERGY DES 3.5X38. Post intervention, there is a 0% residual stenosis. Prox LAD lesion is 70% stenosed. A drug-eluting stent was successfully placed using a STENT SYNERGY DES 4X12, overlapping the prior stent. Post intervention, there is a 0% residual stenosis. The left ventricular systolic function is normal. The left ventricular ejection fraction is 55-65% by visual estimate. LV end diastolic pressure is normal. There is no aortic valve stenosis. Ao sat 98%, PA sat 65%; PA mean 25 mm Hg; unable to wedge the catheter. Normal right heat pressures. Tortuous right subclavian making catheter navigation difficult from the right radial approach.    Neuro/Psych    GI/Hepatic hiatal hernia, GERD  ,  Endo/Other  diabetes, Oral Hypoglycemic AgentsHypothyroidism   Renal/GU Renal disease     Musculoskeletal  (+) Arthritis ,   Abdominal   Peds  Hematology  (+) Blood dyscrasia (on eliquis), anemia ,   Anesthesia Other Findings   Reproductive/Obstetrics                           Anesthesia Physical  Anesthesia Plan  ASA: 3  Anesthesia Plan: MAC   Post-op Pain Management:  Regional for Post-op pain and Regional block*   Induction: Intravenous  PONV Risk Score and Plan: 2 and Ondansetron, Dexamethasone and Treatment may vary due to age or medical condition  Airway Management  Planned: Natural Airway  Additional Equipment:   Intra-op Plan:   Post-operative Plan:   Informed Consent: I have reviewed the patients History and Physical, chart, labs and discussed the procedure including the risks, benefits and alternatives for the proposed anesthesia with the patient or authorized representative who has indicated his/her understanding and acceptance.     Dental advisory given  Plan Discussed with: Anesthesiologist and CRNA  Anesthesia Plan Comments: (   )       Anesthesia Quick Evaluation

## 2021-11-12 NOTE — Progress Notes (Signed)
  Progress Note    11/12/2021 12:23 PM Day of Surgery  Subjective:  no new issues  Vitals:   11/12/21 0520 11/12/21 0743  BP: (!) 100/45 (!) 130/55  Pulse: 85 80  Resp: 17 18  Temp: 98.8 F (37.1 C) 97.7 F (36.5 C)  SpO2: 100% 95%    Physical Exam: Aaox3 Dressing Left foot  CBC    Component Value Date/Time   WBC 14.6 (H) 11/12/2021 0624   RBC 3.35 (L) 11/12/2021 0624   HGB 7.7 (L) 11/12/2021 0624   HGB 12.3 (L) 05/30/2017 1531   HCT 24.0 (L) 11/12/2021 0624   HCT 37.7 05/30/2017 1531   PLT 250 11/12/2021 0624   PLT 212 05/30/2017 1531   MCV 71.6 (L) 11/12/2021 0624   MCV 86 05/30/2017 1531   MCH 23.0 (L) 11/12/2021 0624   MCHC 32.1 11/12/2021 0624   RDW 21.2 (H) 11/12/2021 0624   RDW 16.0 (H) 05/30/2017 1531   LYMPHSABS 1.1 11/10/2021 2216   MONOABS 2.2 (H) 11/10/2021 2216   EOSABS 0.1 11/10/2021 2216   BASOSABS 0.1 11/10/2021 2216    BMET    Component Value Date/Time   NA 134 (L) 11/12/2021 0624   NA 139 05/30/2017 1531   K 4.1 11/12/2021 0624   CL 105 11/12/2021 0624   CO2 21 (L) 11/12/2021 0624   GLUCOSE 121 (H) 11/12/2021 0624   BUN 21 11/12/2021 0624   BUN 13 05/30/2017 1531   CREATININE 2.17 (H) 11/12/2021 0624   CREATININE 1.00 01/04/2018 1401   CALCIUM 7.9 (L) 11/12/2021 0624   GFRNONAA 29 (L) 11/12/2021 0624   GFRNONAA >60 01/04/2018 1401   GFRAA >60 12/04/2019 1416   GFRAA >60 01/04/2018 1401    INR    Component Value Date/Time   INR 1.3 (H) 09/26/2019 0826    No intake or output data in the 24 hours ending 11/12/21 1223   Assessment:  88 y.o. male is with necrotic left toes  Plan: OR today for left TMA   Alie Moudy C. Donzetta Matters, MD [facility] [place] Office: [phone] Pager: [phone]  11/12/2021 12:23 PM

## 2021-11-12 NOTE — Progress Notes (Signed)
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  Patient has developed wound which has been spreading from second toe to the surrounding areas associated with foul-smelling discharge.  Patient reports diabetic neuropathy and was prescribed Bactrim and been taking it for last 10 days.  Patient was seen by Dr. Trula Slade in the ED,  recommended TMA amputation today by [name]..  Assessment & Plan:   Principal Problem:   Gangrene of left foot (HCC) Active Problems:   Acute kidney injury superimposed on chronic kidney disease (HCC)   Chronic diastolic CHF (congestive heart failure) (HCC)   Essential hypertension   CAD S/P percutaneous coronary angioplasty   PAF (paroxysmal atrial fibrillation) (HCC)   Type 2 diabetes mellitus (HCC)   Malnutrition of moderate degree  Gangrene of left foot: Non healing Diabetic foot wound in the setting of diabetic neuropathy: Patient presented with left foot infection with diabetic foot ulcers, wet gangrene, surrounding cellulitis. MRI concerning for osteomyelitis. Patient was prescribed Bactrim and has been taking it for last 1 week without any improvement. Patient was seen by Dr. Trula Slade (vascular surgeon) in the ED. Patient will need left TMA (transmetatarsal amputation )scheduled for today. Holding Eliquis. Continue IV antibiotics  (Rocephin + Flagyl + linezolid) for the moment. Patient does have history of MRSA infection of the foot in 9/22.   Wound care consulted  AKI on CKD stage IIIb: Baseline serum creatinine 1.2 -1.58.  Creatinine on admission 2.63 Likely due to infection, decrease p.o. intake and recent antibiotic use. Avoid  nephrotoxic medications / Hold Lasix and lisinopril Continue IV hydration.  Renal functions improving. 2.17 Consider nephro consult if serum creatinine trending up.  Chronic diastolic CHF: Appears euvolemic, not in any fluid overload. Continue Imdur 60 mg daily Hold Lasix and lisinopril given AKI. Last echo in 12/16/2019 showed LVEF 65 to 70%. 2D echo shows EF 65 to 70%.  No RWMA  Type 2 diabetes: Hold metformin, Continue 20 units of Lantus twice daily Continue moderate scale sliding scale  Paroxysmal atrial fibrillation : Heart rate is well controlled.   Hold Eliquis for now.  CAD : Continue Lipitor 80 mg daily,  Hold Eliquis and Plavix in the setting of foot gangrene requiring TMA   Essential hypertension: Continue Imdur, hold lisinopril in the setting of AKI.  Microcytic hypochromic anemia: Baseline Hb remains between 8.5-8.9. He presented with Hb 6.8 requiring 1 unit of PRBC.  Hemoglobin improved to 7.7. No signs of any visible / Obvious bleeding.  DVT prophylaxis:  Heparin Code Status:Full code. Family Communication: No family at bedside Disposition Plan:   Status is: Inpatient Remains inpatient appropriate because:  Admitted for gangrenous left foot requiring TMA amputation today.  PT and OT evaluation pending Anticipated discharge home in 1 to 2 days.   Consultants:  Vascular surgery  Procedures: Scheduled TMA tomorrow Antimicrobials:  Anti-infectives (From admission, onward)    Start     Dose/Rate Route Frequency Ordered Stop   11/11/21 2300  vancomycin (VANCOREADY) IVPB 1250 mg/250 mL  Status:  Discontinued        1,250 mg 166.7 mL/hr over 90 Minutes Intravenous Every 48 hours 11/10/21 2146 11/10/21  2207   11/10/21 2215  linezolid (ZYVOX) tablet 600 mg  Status:  Discontinued        600 mg Oral Every 12 hours 11/10/21 2208 11/10/21 2210   11/10/21 2215  [MAR Hold]  linezolid (ZYVOX) IVPB 600 mg        (MAR Hold since Fri 11/12/2021 at 1223.Hold Reason:  Transfer to a Procedural area)   600 mg 300 mL/hr over 60 Minutes Intravenous Every 12 hours 11/10/21 2210     11/10/21 2200  [MAR Hold]  metroNIDAZOLE (FLAGYL) tablet 500 mg        (MAR Hold since Fri 11/12/2021 at 1223.Hold Reason: Transfer to a Procedural area)   500 mg Oral Every 12 hours 11/10/21 2138 11/17/21 2159   11/10/21 2200  vancomycin (VANCOREADY) IVPB 1500 mg/300 mL  Status:  Discontinued        1,500 mg 150 mL/hr over 120 Minutes Intravenous  Once 11/10/21 2146 11/10/21 2207   11/10/21 2145  [MAR Hold]  cefTRIAXone (ROCEPHIN) 2 g in sodium chloride 0.9 % 100 mL IVPB        (MAR Hold since Fri 11/12/2021 at 1223.Hold Reason: Transfer to a Procedural area)   2 g 200 mL/hr over 30 Minutes Intravenous Every 24 hours 11/10/21 2138 11/17/21 2144      Subjective: Patient was seen and examined at bedside. Overnight events noted.   Patient reports feeling better. He denies any pain.   He reports worsening numbness. He is scheduled to have tarsal metatarsal amputation today.  Objective: Vitals:   11/11/21 2006 11/12/21 0520 11/12/21 0743 11/12/21 1224  BP: 133/65 (!) 100/45 (!) 130/55 113/77  Pulse: 62 85 80 (!) 58  Resp: '17 17 18 18  '$ Temp: 98.5 F (36.9 C) 98.8 F (37.1 C) 97.7 F (36.5 C) 98 F (36.7 C)  TempSrc: Oral  Oral Oral  SpO2: 98% 100% 95% 96%  Weight:    77.1 kg  Height:    '5\' 7"'$  (1.702 m)   No intake or output data in the 24 hours ending 11/12/21 1244  Filed Weights   11/10/21 2344 11/12/21 1224  Weight: 82.3 kg 77.1 kg    Examination:  General exam: Appears comfortable, not in any acute distress.  Deconditioned Respiratory system: CTA bilaterally, no crackles, normal respiratory effort. Cardiovascular system: S1 & S2 heard, regular rate and rhythm, no murmur. Gastrointestinal system: Abdomen is soft, non tender, non distended, BS+ Central nervous system: Alert and oriented x3. No focal neurological deficits. Extremities: Right foot amputated toes,  left foot amputated big toe, blackening of the toes on the medial side. Skin: No rashes, lesions or ulcers Psychiatry: Judgement and insight appear normal. Mood & affect appropriate.     Data Reviewed: I have personally reviewed following labs and imaging studies  CBC: Recent Labs  Lab 11/10/21 1341 11/10/21 2216 11/11/21 0316 11/12/21 0624  WBC 14.4* 12.1* 10.0 14.6*  NEUTROABS  --  8.2*  --   --   HGB 7.7* 7.6* 6.8* 7.7*  HCT 24.7* 24.4* 21.8* 24.0*  MCV 68.6* 69.7* 69.9* 71.6*  PLT 441* 396 266 009   Basic Metabolic Panel: Recent Labs  Lab 11/10/21 1341 11/11/21 0316 11/12/21 0624  NA 135 135 134*  K 4.9 4.4 4.1  CL 100 103 105  CO2 25 23 21*  GLUCOSE 110* 138* 121*  BUN 25* 26* 21  CREATININE 2.63* 2.61* 2.17*  CALCIUM 8.5* 7.9* 7.9*  MG  --   --  2.2  PHOS  --   --  3.6   GFR: Estimated Creatinine Clearance: 22.8 mL/min (A) (by C-G formula based on SCr of 2.17 mg/dL (H)). Liver Function Tests: No results for input(s): "AST", "ALT", "ALKPHOS", "BILITOT", "PROT", "ALBUMIN" in the last 168 hours. No results for input(s): "LIPASE", "AMYLASE" in the last 168 hours. No results for input(s): "AMMONIA" in the last 168 hours. Coagulation Profile: No results for input(s): "INR", "PROTIME" in the last 168 hours. Cardiac Enzymes: No results for input(s): "CKTOTAL", "CKMB", "CKMBINDEX", "TROPONINI" in the last 168 hours. BNP (last 3 results) No results for input(s): "PROBNP" in the last 8760 hours. HbA1C: Recent Labs    11/10/21 2216  HGBA1C 6.5*   CBG: Recent Labs  Lab 11/11/21 1224 11/11/21 1516 11/11/21 2209 11/12/21 0739 11/12/21 1227  GLUCAP 118* 149* 132* 118* 98   Lipid Profile: No results for input(s): "CHOL", "HDL", "LDLCALC", "TRIG", "CHOLHDL", "LDLDIRECT" in the last 72 hours. Thyroid Function Tests: No results for input(s): "TSH", "T4TOTAL", "FREET4", "T3FREE", "THYROIDAB" in the last 72 hours. Anemia Panel: No results for input(s):  "VITAMINB12", "FOLATE", "FERRITIN", "TIBC", "IRON", "RETICCTPCT" in the last 72 hours. Sepsis Labs: Recent Labs  Lab 11/10/21 1341 11/10/21 2218  LATICACIDVEN 1.5 1.1    No results found for this or any previous visit (from the past 240 hour(s)).       Radiology Studies: ECHOCARDIOGRAM COMPLETE  Result Date: 11/11/2021    ECHOCARDIOGRAM REPORT   Patient Name:   CATHAN GEARIN Date of Exam: 11/11/2021 Medical Rec #:  [id number]       Height:       67.0 in Accession #:    [id number]      Weight:       181.4 lb Date of Birth:  December 25, 1933       BSA:          1.940 m Patient Age:    14 years        BP:           128/73 mmHg Patient Gender: M               HR:           54 bpm. Exam Location:  Inpatient Procedure: 2D Echo, Cardiac Doppler, Color Doppler and Intracardiac            Opacification Agent Indications:    Chest Pain  History:        Patient has prior history of Echocardiogram examinations, most                 recent 01/29/2020. CAD, PAD and COPD, Arrythmias:Atrial                 Fibrillation and Bradycardia; Risk Factors:Hypertension,                 Diabetes and Former Smoker.  Sonographer:    Leavy Cella RDCS Referring Phys: [place]  1. Left ventricular ejection fraction, by estimation, is 70 to 75%. Left ventricular ejection fraction by PLAX is 71 %. The left ventricle has hyperdynamic function. The left ventricle has no regional wall motion abnormalities. Left ventricular diastolic function could not be evaluated.  2. Right ventricular systolic function is hyperdynamic. The right ventricular size is normal. Tricuspid regurgitation signal is inadequate for assessing PA pressure.  3. Left atrial size was severely dilated.  4. The mitral valve is abnormal. Trivial mitral valve regurgitation. Moderate mitral annular calcification.  5. The  aortic valve is calcified. There is moderate calcification of the aortic valve. Aortic valve regurgitation is mild. Moderate  aortic valve stenosis. Aortic valve area, by VTI measures 1.14 cm. Aortic valve mean gradient measures 25.0 mmHg. Aortic  valve Vmax measures 3.46 m/s. Peak gradient 48 mmHg. DI is 0.33.  6. The inferior vena cava is dilated in size with >50% respiratory variability, suggesting right atrial pressure of 8 mmHg. Comparison(s): Changes from prior study are noted. 01/29/2020: LVEF 65-70%, moderate AS - mean gradient 27.5 mmHg, DI 0.41. FINDINGS  Left Ventricle: Left ventricular ejection fraction, by estimation, is 70 to 75%. Left ventricular ejection fraction by PLAX is 71 %. The left ventricle has hyperdynamic function. The left ventricle has no regional wall motion abnormalities. Definity contrast agent was given IV to delineate the left ventricular endocardial borders. The left ventricular internal cavity size was normal in size. There is no left ventricular hypertrophy. Left ventricular diastolic function could not be evaluated due to atrial fibrillation. Left ventricular diastolic function could not be evaluated. Right Ventricle: The right ventricular size is normal. No increase in right ventricular wall thickness. Right ventricular systolic function is hyperdynamic. Tricuspid regurgitation signal is inadequate for assessing PA pressure. Left Atrium: Left atrial size was severely dilated. Right Atrium: Right atrial size was normal in size. Pericardium: There is no evidence of pericardial effusion. Mitral Valve: The mitral valve is abnormal. Moderate mitral annular calcification. Trivial mitral valve regurgitation. Tricuspid Valve: The tricuspid valve is grossly normal. Tricuspid valve regurgitation is trivial. Aortic Valve: The aortic valve is calcified. There is moderate calcification of the aortic valve. Aortic valve regurgitation is mild. Aortic regurgitation PHT measures 617 msec. Moderate aortic stenosis is present. Aortic valve mean gradient measures 25.0 mmHg. Aortic valve peak gradient measures 47.9 mmHg.  Aortic valve area, by VTI measures 1.14 cm. Pulmonic Valve: The pulmonic valve was grossly normal. Pulmonic valve regurgitation is trivial. Aorta: The aortic root and ascending aorta are structurally normal, with no evidence of dilitation. Venous: The inferior vena cava is dilated in size with greater than 50% respiratory variability, suggesting right atrial pressure of 8 mmHg. IAS/Shunts: No atrial level shunt detected by color flow Doppler.  LEFT VENTRICLE PLAX 2D LV EF:         Left            Diastology                ventricular     LV e' medial:    6.30 cm/s                ejection        LV E/e' medial:  12.2                fraction by     LV e' lateral:   9.83 cm/s                PLAX is 71      LV E/e' lateral: 7.8                %. LVIDd:         4.70 cm LVIDs:         2.80 cm LV PW:         2.45 cm LV IVS:        1.00 cm LVOT diam:     2.10 cm LV SV:         85 LV SV Index:   44  LVOT Area:     3.46 cm  RIGHT VENTRICLE RV S prime:     19.00 cm/s TAPSE (M-mode): 2.9 cm LEFT ATRIUM              Index        RIGHT ATRIUM           Index LA diam:        4.40 cm  2.27 cm/m   RA Area:     12.90 cm LA Vol (A2C):   147.0 ml 75.77 ml/m  RA Volume:   34.30 ml  17.68 ml/m LA Vol (A4C):   120.0 ml 61.85 ml/m LA Biplane Vol: 137.0 ml 70.62 ml/m  AORTIC VALVE AV Area (Vmax):    1.10 cm AV Area (Vmean):   1.19 cm AV Area (VTI):     1.14 cm AV Vmax:           346.00 cm/s AV Vmean:          228.000 cm/s AV VTI:            0.744 m AV Peak Grad:      47.9 mmHg AV Mean Grad:      25.0 mmHg LVOT Vmax:         110.00 cm/s LVOT Vmean:        78.100 cm/s LVOT VTI:          0.244 m LVOT/AV VTI ratio: 0.33 AI PHT:            617 msec  AORTA Ao Root diam: 3.20 cm Ao Asc diam:  3.10 cm MITRAL VALVE MV Area (PHT): 3.30 cm    SHUNTS MV Decel Time: 230 msec    Systemic VTI:  0.24 m MV E velocity: 77.10 cm/s  Systemic Diam: 2.10 cm MV A velocity: 49.50 cm/s MV E/A ratio:  1.56 Lyman Bishop MD Electronically signed by Lyman Bishop MD Signature Date/Time: 11/11/2021/3:07:06 PM    Final    MR FOOT LEFT WO CONTRAST  Result Date: 11/10/2021 CLINICAL DATA:  Osteomyelitis of the foot suspected. New wound with ischemic changes and order to the left foot. EXAM: MRI OF THE LEFT FOOT WITHOUT CONTRAST TECHNIQUE: Multiplanar, multisequence MR imaging of the left was performed. No intravenous contrast was administered. COMPARISON:  Radiographs dated November 10, 2021. FINDINGS: Bones/Joint/Cartilage Postsurgical changes for prior amputation of the first ray through the mid first metatarsal. There is bone marrow edema in the distal half of the second metatarsal and in the proximal phalanx of the second metatarsal. Superior subluxation of the second metatarsal at the metatarsophalangeal joint. Marrow edema at the base of the proximal phalanx of the third digit. Ligaments Lisfranc ligament appear intact. Evaluation of collateral ligaments is limited due to motion. Muscles and Tendons Increased intramuscular signal of the plantar muscles secondary to diabetic myopathy/myositis. No appreciable fluid collection. Soft tissues There is soft tissue edema and open wound on the plantar aspect of the second digit with surrounding phlegmonous changes. There is soft tissue edema and likely small amount of free air on the dorsal aspect of the third digit. Soft tissue swelling about the dorsum of the foot. IMPRESSION: 1. Bone marrow edema of the distal half of the second metatarsal and proximal phalanx of the second digit with subluxation at the metatarsophalangeal joint, which in the presence of adjacent deep skin wound phlegmonous changes is consistent with osteomyelitis with septic arthritis. 2. Bone marrow edema of the proximal phalanx of the third digit with  surrounding edema and inflammatory changes also concerning for osteomyelitis. 3. Deep skin wound and phlegmonous changes about the plantar aspect of the second digit as well as inflammatory changes about  the third proximal phalanx suggesting phlegmonous process. Clinical correlation is suggested, 4.  Additional chronic findings as above. Electronically Signed   By: Keane Police D.O.   On: 11/10/2021 17:59   VAS [place] ABI WITH/WO TBI  Result Date: 11/10/2021  LOWER EXTREMITY DOPPLER STUDY Patient Name:  Mike Wright  Date of Exam:   11/10/2021 Medical Rec #: [id number]        Accession #:    [id number] Date of Birth: October 24, 1933        Patient Gender: M Patient Age:   63 years Exam Location:  [facility] Procedure:      VAS [place] ABI WITH/WO TBI Referring Phys: CHRISTIAN PROSPERI --------------------------------------------------------------------------------  Indications: Gangrene. right transmetatarsal amputation and Left great and              second toes. High Risk Factors: Hypertension, hyperlipidemia, Diabetes, past history of                    smoking, coronary artery disease.  Comparison Study: 05/17/21 at outside facility RT 0.7, LT 1.03 Performing Technologist: Bobetta Lime BS RVT  Examination Guidelines: A complete evaluation includes at minimum, Doppler waveform signals and systolic blood pressure reading at the level of bilateral brachial, anterior tibial, and posterior tibial arteries, when vessel segments are accessible. Bilateral testing is considered an integral part of a complete examination. Photoelectric Plethysmograph (PPG) waveforms and toe systolic pressure readings are included as required and additional duplex testing as needed. Limited examinations for reoccurring indications may be performed as noted.  ABI Findings: +---------+------------------+-----+---------+----------+ Right    Rt Pressure (mmHg)IndexWaveform Comment    +---------+------------------+-----+---------+----------+ Brachial 119                    triphasic           +---------+------------------+-----+---------+----------+ PTA      128               1.07 triphasic            +---------+------------------+-----+---------+----------+ DP       118               0.98 triphasic           +---------+------------------+-----+---------+----------+ Great Toe                                Amputation +---------+------------------+-----+---------+----------+ +---------+------------------+-----+----------+----------+ Left     Lt Pressure (mmHg)IndexWaveform  Comment    +---------+------------------+-----+----------+----------+ Brachial 120                    triphasic            +---------+------------------+-----+----------+----------+ PTA      121               1.01 biphasic             +---------+------------------+-----+----------+----------+ DP       92                0.77 monophasic           +---------+------------------+-----+----------+----------+ Great Toe  Amputation +---------+------------------+-----+----------+----------+ +-------+-----------+-----------+------------+------------+ ABI/TBIToday's ABIToday's TBIPrevious ABIPrevious TBI +-------+-----------+-----------+------------+------------+ Right  108                   .7                       +-------+-----------+-----------+------------+------------+ Left   1.01                  1.03                     +-------+-----------+-----------+------------+------------+ Right ABIs appear increased. Left ABIs appear essentially unchanged. Right transmetatarsal amputation, left great toe and second toe amputation  Summary: Right: Resting right ankle-brachial index is within normal range. Left: Resting left ankle-brachial index indicates noncompressible left lower extremity arteries. *See table(s) above for measurements and observations.  Electronically signed by Jamelle Haring on 11/10/2021 at 5:27:22 PM.    Final    VAS [place] LOWER EXTREMITY ARTERIAL DUPLEX (7a-7p)  Result Date: 11/10/2021 LOWER EXTREMITY ARTERIAL DUPLEX STUDY Patient Name:  Mike Wright  Date of Exam:   11/10/2021 Medical Rec #: [id number]        Accession #:    [id number] Date of Birth: 02-Nov-1933        Patient Gender: M Patient Age:   58 years Exam Location:  [facility] Procedure:      VAS [place] LOWER EXTREMITY ARTERIAL DUPLEX Referring Phys: CHRISTIAN PROSPERI --------------------------------------------------------------------------------  Indications: Gangrene. High Risk Factors: Hypertension, hyperlipidemia, Diabetes, past history of                    smoking, coronary artery disease.  Current ABI: 1.01 Comparison Study: No previous left LE arterial duplex noted. Performing Technologist: Bobetta Lime BS, RVT  Examination Guidelines: A complete evaluation includes B-mode imaging, spectral Doppler, color Doppler, and power Doppler as needed of all accessible portions of each vessel. Bilateral testing is considered an integral part of a complete examination. Limited examinations for reoccurring indications may be performed as noted.   +----------+--------+-----+--------+----------+--------+ LEFT      PSV cm/sRatioStenosisWaveform  Comments +----------+--------+-----+--------+----------+--------+ CIA Distal                     triphasic          +----------+--------+-----+--------+----------+--------+ CFA Distal243                  triphasic          +----------+--------+-----+--------+----------+--------+ DFA       114                  triphasic          +----------+--------+-----+--------+----------+--------+ SFA Prox  209                  triphasic          +----------+--------+-----+--------+----------+--------+ SFA Mid   128                  triphasic          +----------+--------+-----+--------+----------+--------+ SFA Distal111                  triphasic          +----------+--------+-----+--------+----------+--------+ POP Prox  97                   triphasic           +----------+--------+-----+--------+----------+--------+ POP Distal74  triphasic          +----------+--------+-----+--------+----------+--------+ TP Trunk  108                  triphasic          +----------+--------+-----+--------+----------+--------+ ATA Prox  96                   biphasic           +----------+--------+-----+--------+----------+--------+ ATA Mid   89                   biphasic           +----------+--------+-----+--------+----------+--------+ ATA Distal41                   biphasic           +----------+--------+-----+--------+----------+--------+ PTA Prox  111                  biphasic           +----------+--------+-----+--------+----------+--------+ PTA Mid   193                  biphasic           +----------+--------+-----+--------+----------+--------+ PTA Distal120                  biphasic           +----------+--------+-----+--------+----------+--------+ DP        43                   monophasic         +----------+--------+-----+--------+----------+--------+  Summary: Left: No hemodynamically significant stenoses noted.  See table(s) above for measurements and observations. Electronically signed by Jamelle Haring on 11/10/2021 at 5:27:04 PM.    Final    DG Foot 2 Views Left  Result Date: 11/10/2021 CLINICAL DATA:  88 year old male with concern for osteomyelitis. EXAM: LEFT FOOT - 2 VIEW COMPARISON:  02/02/2020 FINDINGS: Status post amputation of the mid first metatarsal. Dorsal subluxation of the second metatarsophalangeal joint. Subcutaneous emphysema in the dorsal and medial aspect of the second toe. No definite cortical abnormality, however limited evaluation with only two views. Mild plantar calcaneal enthesopathy. IMPRESSION: Soft tissue emphysema about the medial and dorsal aspect of the second toe without definite underlying osseous involvement, however this is a limited study. Consider MRI of the  foot for further characterization as clinically indicated. Electronically Signed   By: Ruthann Cancer M.D.   On: 11/10/2021 14:07     Scheduled Meds:  [MAR Hold] sodium chloride   Intravenous Once   [MAR Hold] atorvastatin  80 mg Oral QPM   [MAR Hold] feeding supplement  237 mL Oral BID BM   fentaNYL       fentaNYL (SUBLIMAZE) injection  100 mcg Intravenous Once   [MAR Hold] insulin aspart  0-15 Units Subcutaneous TID WC   [MAR Hold] insulin aspart  0-5 Units Subcutaneous QHS   [MAR Hold] insulin glargine-yfgn  20 Units Subcutaneous BID   [MAR Hold] isosorbide mononitrate  60 mg Oral BID   [MAR Hold] metroNIDAZOLE  500 mg Oral Q12H   [MAR Hold] multivitamin with minerals  1 tablet Oral Daily   [MAR Hold] nutrition supplement (JUVEN)  1 packet Oral BID BM   [MAR Hold] traZODone  150 mg Oral QHS   Continuous Infusions:  sodium chloride 75 mL/hr at 11/12/21 1001   [MAR Hold] cefTRIAXone (ROCEPHIN)  IV 2 g (11/11/21  2245)   [MAR Hold] linezolid (ZYVOX) IV 600 mg (11/12/21 1015)     LOS: 2 days    Time spent: 35 mins    Zarriah Starkel, MD [facility]   If 7PM-7AM, please contact night-coverage

## 2021-11-13 DIAGNOSIS — I96 Gangrene, not elsewhere classified: Secondary | ICD-10-CM | POA: Diagnosis not present

## 2021-11-13 LAB — GLUCOSE, CAPILLARY
Glucose-Capillary: 233 mg/dL — ABNORMAL HIGH (ref 70–99)
Glucose-Capillary: 254 mg/dL — ABNORMAL HIGH (ref 70–99)
Glucose-Capillary: 159 mg/dL — ABNORMAL HIGH (ref 70–99)
Glucose-Capillary: 141 mg/dL — ABNORMAL HIGH (ref 70–99)

## 2021-11-13 LAB — BASIC METABOLIC PANEL
Anion gap: 8 (ref 5–15)
BUN: 24 mg/dL — ABNORMAL HIGH (ref 8–23)
CO2: 19 mmol/L — ABNORMAL LOW (ref 22–32)
Calcium: 7.7 mg/dL — ABNORMAL LOW (ref 8.9–10.3)
Chloride: 105 mmol/L (ref 98–111)
Creatinine, Ser: 1.85 mg/dL — ABNORMAL HIGH (ref 0.61–1.24)
GFR, Estimated: 35 mL/min — ABNORMAL LOW (ref >60)
Glucose, Bld: 294 mg/dL — ABNORMAL HIGH (ref 70–99)
Potassium: 5.3 mmol/L — ABNORMAL HIGH (ref 3.5–5.1)
Sodium: 132 mmol/L — ABNORMAL LOW (ref 135–145)

## 2021-11-13 LAB — CBC
HCT: 22.3 % — ABNORMAL LOW (ref 39.0–52.0)
Hemoglobin: 7 g/dL — ABNORMAL LOW (ref 13.0–17.0)
MCH: 22.6 pg — ABNORMAL LOW (ref 26.0–34.0)
MCHC: 31.4 g/dL (ref 30.0–36.0)
MCV: 71.9 fL — ABNORMAL LOW (ref 80.0–100.0)
Platelets: 199 10*3/uL (ref 150–400)
RBC: 3.1 MIL/uL — ABNORMAL LOW (ref 4.22–5.81)
RDW: 21.3 % — ABNORMAL HIGH (ref 11.5–15.5)
WBC: 9.6 10*3/uL (ref 4.0–10.5)
nRBC: 0 % (ref 0.0–0.2)

## 2021-11-13 LAB — POTASSIUM: Potassium: 4.7 mmol/L (ref 3.5–5.1)

## 2021-11-13 LAB — HEMOGLOBIN AND HEMATOCRIT, BLOOD
HCT: 25 % — ABNORMAL LOW (ref 39.0–52.0)
Hemoglobin: 8 g/dL — ABNORMAL LOW (ref 13.0–17.0)

## 2021-11-13 LAB — PREPARE RBC (CROSSMATCH)

## 2021-11-13 MED ORDER — FUROSEMIDE 10 MG/ML IJ SOLN
40.0000 mg | Freq: Once | INTRAMUSCULAR | Status: AC
  Administered 2021-11-13: 40 mg via INTRAVENOUS
  Filled 2021-11-13: qty 4

## 2021-11-13 MED ORDER — TRAMADOL HCL 50 MG PO TABS
50.0000 mg | ORAL_TABLET | Freq: Four times a day (QID) | ORAL | Status: DC | PRN
  Administered 2021-11-13: 50 mg via ORAL
  Filled 2021-11-13: qty 1

## 2021-11-13 MED ORDER — SODIUM CHLORIDE 0.9% IV SOLUTION: Freq: Once | INTRAVENOUS | Status: AC

## 2021-11-13 NOTE — Progress Notes (Signed)
Dr. Dwyane Dee informed to comeand speak to pt about consenting patient. Voiced that he will be here soon.

## 2021-11-13 NOTE — Progress Notes (Signed)
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  Patient has developed wound which has been spreading from second toe to the surrounding areas associated with foul-smelling discharge.  Patient reports diabetic neuropathy and was prescribed Bactrim and been taking it for last 10 days.  Patient was seen by Dr. Trula Slade in the ED,  recommended TMA amputation .  Patient underwent left TMA, tolerated well, postoperative day 1.  Postoperatively hemoglobin dropped to 7.0 requiring 1 unit of PRBC.  Assessment & Plan:   Principal Problem:   Gangrene of left foot (HCC) Active Problems:   Acute kidney injury superimposed on chronic kidney disease (HCC)   Chronic diastolic CHF (congestive heart failure) (HCC)   Essential hypertension   CAD S/P percutaneous coronary angioplasty   PAF (paroxysmal atrial fibrillation) (HCC)   Type 2 diabetes mellitus (HCC)   Malnutrition of moderate degree  Gangrene of left foot: Non healing Diabetic foot wound in the setting of diabetic neuropathy: Patient presented with left foot infection with diabetic foot ulcers, wet gangrene, surrounding cellulitis. MRI concerning for osteomyelitis. Patient was prescribed Bactrim and has been taking it for last 1 week without any improvement. Patient was seen by Dr. Trula Slade (vascular surgeon) in the ED. Patient will need left TMA (transmetatarsal amputation ) Holding Eliquis. Continue IV antibiotics  (Rocephin + Flagyl + linezolid) for the moment. Patient does have history of MRSA infection of the foot in 9/22.   Wound care consulted. 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  POD 1.  AKI on  CKD stage IIIb: Baseline serum creatinine 1.2 -1.58.  Creatinine on admission 2.63 Likely due to infection, decrease p.o. intake and recent antibiotic use. Avoid nephrotoxic medications / Hold Lasix and lisinopril Continue IV hydration.  Renal functions improving. 2.17>1.85 Consider nephro consult if serum creatinine trending up.  Chronic diastolic CHF: Appears euvolemic, not in any fluid overload. Continue Imdur 60 mg daily Hold Lasix and lisinopril given AKI. Last echo in 12/16/2019 showed LVEF 65 to 70%. 2D echo shows EF 65 to 70%.  No RWMA  Type 2 diabetes: Hold metformin, Continue 20 units of Semglee twice daily Continue moderate scale sliding scale  Paroxysmal atrial fibrillation : Heart rate is well controlled.   Hold Eliquis for now.  CAD : Continue Lipitor 80 mg daily,  Hold Eliquis and Plavix in the setting of foot gangrene requiring TMA  Resume Eliquis and Plavix tomorrow.  Essential hypertension: Continue Imdur, hold lisinopril in the setting of AKI.  Microcytic hypochromic anemia: Baseline Hb remains between 8.5-8.9. He presented with Hb 6.8 requiring 1 unit of PRBC.  Hb dropped to 7.0 postoperatively. Transfuse 1 unit PRBC.  Follow posttransfusion hemoglobin. No signs of any visible / Obvious bleeding.  DVT prophylaxis:  Heparin Code Status:Full code. Family Communication: No family at bedside Disposition Plan:   Status is: Inpatient Remains inpatient appropriate because:  Admitted for gangrenous left foot underwent successful TMA 11/12/2021.  PT and OT evaluation pending Anticipated discharge home in 1 to 2 days.   Consultants:  Vascular surgery  Procedures: Left TMA 11/12/2021 Antimicrobials:  Anti-infectives (From admission, onward)    Start  Dose/Rate Route Frequency Ordered Stop   11/11/21 2300  vancomycin (VANCOREADY) IVPB 1250 mg/250 mL  Status:  Discontinued        1,250 mg 166.7 mL/hr over 90 Minutes Intravenous Every 48 hours 11/10/21  2146 11/10/21 2207   11/10/21 2215  linezolid (ZYVOX) tablet 600 mg  Status:  Discontinued        600 mg Oral Every 12 hours 11/10/21 2208 11/10/21 2210   11/10/21 2215  linezolid (ZYVOX) IVPB 600 mg        600 mg 300 mL/hr over 60 Minutes Intravenous Every 12 hours 11/10/21 2210     11/10/21 2200  metroNIDAZOLE (FLAGYL) tablet 500 mg        500 mg Oral Every 12 hours 11/10/21 2138 11/17/21 2159   11/10/21 2200  vancomycin (VANCOREADY) IVPB 1500 mg/300 mL  Status:  Discontinued        1,500 mg 150 mL/hr over 120 Minutes Intravenous  Once 11/10/21 2146 11/10/21 2207   11/10/21 2145  cefTRIAXone (ROCEPHIN) 2 g in sodium chloride 0.9 % 100 mL IVPB        2 g 200 mL/hr over 30 Minutes Intravenous Every 24 hours 11/10/21 2138 11/17/21 2144      Subjective: Patient was seen and examined at bedside. Overnight events noted.   Patient reports feeling better, he denies any pain. He underwent successful left TMA yesterday,  tolerated well.  POD 1 Hemoglobin dropped requiring 1 unit of PRBC.  Consent obtained.  Objective: Vitals:   11/13/21 0035 11/13/21 0451 11/13/21 0500 11/13/21 0832  BP: 117/61 (!) 112/51  105/84  Pulse: 68 62  70  Resp: '16 16  16  '$ Temp: 98.6 F (37 C) 97.9 F (36.6 C)  97.6 F (36.4 C)  TempSrc:    Oral  SpO2: 100% 95%  99%  Weight:   77.1 kg   Height:        Intake/Output Summary (Last 24 hours) at 11/13/2021 1011 Last data filed at 11/12/2021 2249 Gross per 24 hour  Intake 300 ml  Output 870 ml  Net -570 ml    Filed Weights   11/10/21 2344 11/12/21 1224 11/13/21 0500  Weight: 82.3 kg 77.1 kg 77.1 kg    Examination:  General exam: Appears comfortable, not in any acute distress, deconditioned Respiratory system: CTA bilaterally, no crackles, normal respiratory effort. Cardiovascular system: S1 & S2 heard, regular rate and rhythm, no murmur. Gastrointestinal system: Abdomen is soft, non tender, non distended, BS+ Central nervous system: Alert and  oriented x3. No focal neurological deficits. Extremities: Right foot amputated toes, left foot amputated toes.  Covered in dressing Skin: No rashes, lesions or ulcers Psychiatry: Judgement and insight appear normal. Mood & affect appropriate.     Data Reviewed: I have personally reviewed following labs and imaging studies  CBC: Recent Labs  Lab 11/10/21 1341 11/10/21 2216 11/11/21 0316 11/12/21 0624 11/13/21 0155  WBC 14.4* 12.1* 10.0 14.6* 9.6  NEUTROABS  --  8.2*  --   --   --   HGB 7.7* 7.6* 6.8* 7.7* 7.0*  HCT 24.7* 24.4* 21.8* 24.0* 22.3*  MCV 68.6* 69.7* 69.9* 71.6* 71.9*  PLT 441* 396 266 250 962   Basic Metabolic Panel: Recent Labs  Lab 11/10/21 1341 11/11/21 0316 11/12/21 0624 11/13/21 0155 11/13/21 0623  NA 135 135 134* 132*  --   K 4.9 4.4 4.1 5.3* 4.7  CL 100 103 105 105  --   CO2 25 23 21* 19*  --  GLUCOSE 110* 138* 121* 294*  --   BUN 25* 26* 21 24*  --   CREATININE 2.63* 2.61* 2.17* 1.85*  --   CALCIUM 8.5* 7.9* 7.9* 7.7*  --   MG  --   --  2.2  --   --   PHOS  --   --  3.6  --   --    GFR: Estimated Creatinine Clearance: 26.8 mL/min (A) (by C-G formula based on SCr of 1.85 mg/dL (H)). Liver Function Tests: No results for input(s): "AST", "ALT", "ALKPHOS", "BILITOT", "PROT", "ALBUMIN" in the last 168 hours. No results for input(s): "LIPASE", "AMYLASE" in the last 168 hours. No results for input(s): "AMMONIA" in the last 168 hours. Coagulation Profile: No results for input(s): "INR", "PROTIME" in the last 168 hours. Cardiac Enzymes: No results for input(s): "CKTOTAL", "CKMB", "CKMBINDEX", "TROPONINI" in the last 168 hours. BNP (last 3 results) No results for input(s): "PROBNP" in the last 8760 hours. HbA1C: Recent Labs    11/10/21 2216  HGBA1C 6.5*   CBG: Recent Labs  Lab 11/12/21 1227 11/12/21 1419 11/12/21 1631 11/12/21 2053 11/13/21 0751  GLUCAP 98 132* 153* 340* 233*   Lipid Profile: No results for input(s): "CHOL", "HDL",  "LDLCALC", "TRIG", "CHOLHDL", "LDLDIRECT" in the last 72 hours. Thyroid Function Tests: No results for input(s): "TSH", "T4TOTAL", "FREET4", "T3FREE", "THYROIDAB" in the last 72 hours. Anemia Panel: No results for input(s): "VITAMINB12", "FOLATE", "FERRITIN", "TIBC", "IRON", "RETICCTPCT" in the last 72 hours. Sepsis Labs: Recent Labs  Lab 11/10/21 1341 11/10/21 2218  LATICACIDVEN 1.5 1.1    Recent Results (from the past 240 hour(s))  MRSA Next Gen by PCR, Nasal     Status: None   Collection Time: 11/12/21  9:08 AM   Specimen: Nasal Mucosa; Nasal Swab  Result Value Ref Range Status   MRSA by PCR Next Gen NOT DETECTED NOT DETECTED Final    Comment: (NOTE) The GeneXpert MRSA Assay (FDA approved for NASAL specimens only), is one component of a comprehensive MRSA colonization surveillance program. It is not intended to diagnose MRSA infection nor to guide or monitor treatment for MRSA infections. Test performance is not FDA approved in patients less than 3 years old. Performed at [facility], [place] [address] [place], [place] [address]          Radiology Studies: ECHOCARDIOGRAM COMPLETE  Result Date: 11/11/2021    ECHOCARDIOGRAM REPORT   Patient Name:   SHERLEY LESER Date of Exam: 11/11/2021 Medical Rec #:  [id number]       Height:       67.0 in Accession #:    [id number]      Weight:       181.4 lb Date of Birth:  [birth year]       BSA:          1.940 m Patient Age:    66 years        BP:           128/73 mmHg Patient Gender: M               HR:           54 bpm. Exam Location:  Inpatient Procedure: 2D Echo, Cardiac Doppler, Color Doppler and Intracardiac            Opacification Agent Indications:    Chest Pain  History:        Patient has prior history of Echocardiogram examinations, most  recent 01/29/2020. CAD, PAD and COPD, Arrythmias:Atrial                 Fibrillation and Bradycardia; Risk Factors:Hypertension,                 Diabetes and Former Smoker.   Sonographer:    Leavy Cella RDCS Referring Phys: [place]  1. Left ventricular ejection fraction, by estimation, is 70 to 75%. Left ventricular ejection fraction by PLAX is 71 %. The left ventricle has hyperdynamic function. The left ventricle has no regional wall motion abnormalities. Left ventricular diastolic function could not be evaluated.  2. Right ventricular systolic function is hyperdynamic. The right ventricular size is normal. Tricuspid regurgitation signal is inadequate for assessing PA pressure.  3. Left atrial size was severely dilated.  4. The mitral valve is abnormal. Trivial mitral valve regurgitation. Moderate mitral annular calcification.  5. The aortic valve is calcified. There is moderate calcification of the aortic valve. Aortic valve regurgitation is mild. Moderate aortic valve stenosis. Aortic valve area, by VTI measures 1.14 cm. Aortic valve mean gradient measures 25.0 mmHg. Aortic  valve Vmax measures 3.46 m/s. Peak gradient 48 mmHg. DI is 0.33.  6. The inferior vena cava is dilated in size with >50% respiratory variability, suggesting right atrial pressure of 8 mmHg. Comparison(s): Changes from prior study are noted. 01/29/2020: LVEF 65-70%, moderate AS - mean gradient 27.5 mmHg, DI 0.41. FINDINGS  Left Ventricle: Left ventricular ejection fraction, by estimation, is 70 to 75%. Left ventricular ejection fraction by PLAX is 71 %. The left ventricle has hyperdynamic function. The left ventricle has no regional wall motion abnormalities. Definity contrast agent was given IV to delineate the left ventricular endocardial borders. The left ventricular internal cavity size was normal in size. There is no left ventricular hypertrophy. Left ventricular diastolic function could not be evaluated due to atrial fibrillation. Left ventricular diastolic function could not be evaluated. Right Ventricle: The right ventricular size is normal. No increase in right ventricular wall  thickness. Right ventricular systolic function is hyperdynamic. Tricuspid regurgitation signal is inadequate for assessing PA pressure. Left Atrium: Left atrial size was severely dilated. Right Atrium: Right atrial size was normal in size. Pericardium: There is no evidence of pericardial effusion. Mitral Valve: The mitral valve is abnormal. Moderate mitral annular calcification. Trivial mitral valve regurgitation. Tricuspid Valve: The tricuspid valve is grossly normal. Tricuspid valve regurgitation is trivial. Aortic Valve: The aortic valve is calcified. There is moderate calcification of the aortic valve. Aortic valve regurgitation is mild. Aortic regurgitation PHT measures 617 msec. Moderate aortic stenosis is present. Aortic valve mean gradient measures 25.0 mmHg. Aortic valve peak gradient measures 47.9 mmHg. Aortic valve area, by VTI measures 1.14 cm. Pulmonic Valve: The pulmonic valve was grossly normal. Pulmonic valve regurgitation is trivial. Aorta: The aortic root and ascending aorta are structurally normal, with no evidence of dilitation. Venous: The inferior vena cava is dilated in size with greater than 50% respiratory variability, suggesting right atrial pressure of 8 mmHg. IAS/Shunts: No atrial level shunt detected by color flow Doppler.  LEFT VENTRICLE PLAX 2D LV EF:         Left            Diastology                ventricular     LV e' medial:    6.30 cm/s  ejection        LV E/e' medial:  12.2                fraction by     LV e' lateral:   9.83 cm/s                PLAX is 71      LV E/e' lateral: 7.8                %. LVIDd:         4.70 cm LVIDs:         2.80 cm LV PW:         2.45 cm LV IVS:        1.00 cm LVOT diam:     2.10 cm LV SV:         85 LV SV Index:   44 LVOT Area:     3.46 cm  RIGHT VENTRICLE RV S prime:     19.00 cm/s TAPSE (M-mode): 2.9 cm LEFT ATRIUM              Index        RIGHT ATRIUM           Index LA diam:        4.40 cm  2.27 cm/m   RA Area:     12.90 cm  LA Vol (A2C):   147.0 ml 75.77 ml/m  RA Volume:   34.30 ml  17.68 ml/m LA Vol (A4C):   120.0 ml 61.85 ml/m LA Biplane Vol: 137.0 ml 70.62 ml/m  AORTIC VALVE AV Area (Vmax):    1.10 cm AV Area (Vmean):   1.19 cm AV Area (VTI):     1.14 cm AV Vmax:           346.00 cm/s AV Vmean:          228.000 cm/s AV VTI:            0.744 m AV Peak Grad:      47.9 mmHg AV Mean Grad:      25.0 mmHg LVOT Vmax:         110.00 cm/s LVOT Vmean:        78.100 cm/s LVOT VTI:          0.244 m LVOT/AV VTI ratio: 0.33 AI PHT:            617 msec  AORTA Ao Root diam: 3.20 cm Ao Asc diam:  3.10 cm MITRAL VALVE MV Area (PHT): 3.30 cm    SHUNTS MV Decel Time: 230 msec    Systemic VTI:  0.24 m MV E velocity: 77.10 cm/s  Systemic Diam: 2.10 cm MV A velocity: 49.50 cm/s MV E/A ratio:  1.56 Lyman Bishop MD Electronically signed by Lyman Bishop MD Signature Date/Time: 11/11/2021/3:07:06 PM    Final      Scheduled Meds:  sodium chloride   Intravenous Once   atorvastatin  80 mg Oral QPM   feeding supplement  237 mL Oral BID BM   insulin aspart  0-15 Units Subcutaneous TID WC   insulin aspart  0-5 Units Subcutaneous QHS   insulin glargine-yfgn  20 Units Subcutaneous BID   isosorbide mononitrate  60 mg Oral BID   metroNIDAZOLE  500 mg Oral Q12H   multivitamin with minerals  1 tablet Oral Daily   nutrition supplement (JUVEN)  1 packet Oral BID BM   traZODone  150 mg Oral QHS  Continuous Infusions:  sodium chloride 75 mL/hr at 11/12/21 1741   cefTRIAXone (ROCEPHIN)  IV 2 g (11/12/21 2131)   linezolid (ZYVOX) IV 600 mg (11/13/21 0944)     LOS: 3 days    Time spent: 35 mins    Tibor Lemmons, MD [facility]   If 7PM-7AM, please contact night-coverage

## 2021-11-13 NOTE — Progress Notes (Signed)
[facility] [place] PROGRESS NOTE  ASSESSMENT / PLAN: Mike Wright is a 88 y.o. male s/p L TMA 11/12/21. No complaints. Plan mobilize with PT today.  Will take bandage down tomorrow.  Okay for discharge tomorrow.  SUBJECTIVE: No complaints.   OBJECTIVE: BP 113/71 (BP Location: Right Arm)   Pulse (!) 59   Temp 97.8 F (36.6 C) (Oral)   Resp 17   Ht '5\' 7"'$  (1.702 m)   Wt 77.1 kg   SpO2 96%   BMI 26.63 kg/m   Intake/Output Summary (Last 24 hours) at 11/13/2021 1212 Last data filed at 11/13/2021 1208 Gross per 24 hour  Intake 300 ml  Output 870 ml  Net -570 ml    Constitutional: well appearing. no acute distress. Cardiac: RRR. Pulmonary: unlabored Abdomen: soft Vascular: L TMA bandage clean and dry     Latest Ref Rng & Units 11/13/2021    1:55 AM 11/12/2021    6:24 AM 11/11/2021    3:16 AM  CBC  WBC 4.0 - 10.5 K/uL 9.6  14.6  10.0   Hemoglobin 13.0 - 17.0 g/dL 7.0  7.7  6.8   Hematocrit 39.0 - 52.0 % 22.3  24.0  21.8   Platelets 150 - 400 K/uL 199  250  266         Latest Ref Rng & Units 11/13/2021    6:23 AM 11/13/2021    1:55 AM 11/12/2021    6:24 AM  CMP  Glucose 70 - 99 mg/dL  294  121   BUN 8 - 23 mg/dL  24  21   Creatinine 0.61 - 1.24 mg/dL  1.85  2.17   Sodium 135 - 145 mmol/L  132  134   Potassium 3.5 - 5.1 mmol/L 4.7  5.3  4.1   Chloride 98 - 111 mmol/L  105  105   CO2 22 - 32 mmol/L  19  21   Calcium 8.9 - 10.3 mg/dL  7.7  7.9     Estimated Creatinine Clearance: 26.8 mL/min (A) (by C-G formula based on SCr of 1.85 mg/dL (H)).  Mike Wright. Stanford Breed, MD [facility] Phone Number: [phone] 11/13/2021 12:12 PM

## 2021-11-13 NOTE — Evaluation (Signed)
Physical Therapy Evaluation Patient Details Name: Mike Wright MRN: [id number] DOB: 1934-01-24 Today's Date: 11/13/2021  History of Present Illness  88 year old male admitted 8/16 with gangrenous changes of the left foot s/p 8/18 L transmetatarsal amputation. PMH:DM2, PAD, CAD, A.Fib on eliquis  Clinical Impression  PTA pt living with wife in single story home with ramped entrance. Pt reports independence in ambulation with use of cane in public and furniture in his home. Pt was independent with ADLs/iADLs. Pt is currently limited in safe mobility by weightbearing limitation on L LE (weightbearing through heel only) in presence of prior R TMA and generalized weakness. Pt is currently min guard for bed mobility and min A for scooting transfer from bed to chair. PT recommending HHPT at discharge. Pt prefers [facility]. PT will continue to follow acutely.       Recommendations for follow up therapy are one component of a multi-disciplinary discharge planning process, led by the attending physician.  Recommendations may be updated based on patient status, additional functional criteria and insurance authorization.  Follow Up Recommendations Home health PT      Assistance Recommended at Discharge Frequent or constant Supervision/Assistance  Patient can return home with the following  A lot of help with walking and/or transfers;A little help with bathing/dressing/bathroom;Assistance with cooking/housework;Assist for transportation;Help with stairs or ramp for entrance    Equipment Recommendations None recommended by PT  Recommendations for Other Services  OT consult    Functional Status Assessment Patient has had a recent decline in their functional status and demonstrates the ability to make significant improvements in function in a reasonable and predictable amount of time.     Precautions / Restrictions Precautions Required Braces or Orthoses:  (post op shoe (MD  ordering)) Restrictions Weight Bearing Restrictions: Yes LLE Weight Bearing: Partial weight bearing (heel only)      Mobility  Bed Mobility Overal bed mobility: Needs Assistance Bed Mobility: Supine to Sit     Supine to sit: Min guard, HOB elevated     General bed mobility comments: min guard for safety, pt able to manage LE off bed and uses bedrail to pull to EOB and push trunk to upright    Transfers Overall transfer level: Needs assistance Equipment used: Rolling walker (2 wheels) Transfers: Sit to/from Stand, Bed to chair/wheelchair/BSC Sit to Stand: From elevated surface (unable to attain fully uprght in RW)          Lateral/Scoot Transfers: Min assist General transfer comment: min A physical A with bed pad to lift hips for scooting to his R into drop arm recliner    Ambulation/Gait               General Gait Details: unable        Balance Overall balance assessment: Needs assistance Sitting-balance support: Feet supported, No upper extremity supported, Single extremity supported, Bilateral upper extremity supported Sitting balance-Leahy Scale: Fair                                       Pertinent Vitals/Pain Pain Assessment Pain Assessment: 0-10 Pain Score: 4  Pain Location: L foot surgical site Pain Descriptors / Indicators: Sore, Throbbing Pain Intervention(s): Limited activity within patient's tolerance, Monitored during session, Repositioned    Home Living Family/patient expects to be discharged to:: Private residence Living Arrangements: Spouse/significant other Available Help at Discharge: Family;Available 24 hours/day Type of Home: House Home Access:  Ramped entrance       Home Layout: One level Home Equipment: Tub bench;Grab bars - toilet;Grab bars - tub/shower;Hand held shower head;Cane - single point;Rolling Walker (2 wheels);Wheelchair - manual      Prior Function Prior Level of Function : Independent/Modified  Independent             Mobility Comments: ambulating with cane          Extremity/Trunk Assessment   Upper Extremity Assessment Upper Extremity Assessment: Generalized weakness    Lower Extremity Assessment Lower Extremity Assessment: RLE deficits/detail;LLE deficits/detail RLE Deficits / Details: hip ROM WFL, knee lacks full flex/ext, ankle lacks full dorsi/plantar flex, strength grossly 4/5 RLE Sensation: decreased light touch LLE Deficits / Details: hip ROM WFL, knee lacks full flex/ext, ankle lacks full dorsi/plantar flex, strength grossly 3+/5 LLE Sensation: decreased light touch       Communication   Communication: No difficulties  Cognition Arousal/Alertness: Awake/alert Behavior During Therapy: WFL for tasks assessed/performed Overall Cognitive Status: Within Functional Limits for tasks assessed                                          General Comments General comments (skin integrity, edema, etc.): VSS on RA, wife present throughout session    Exercises General Exercises - Lower Extremity Ankle Circles/Pumps: AROM, Both, 10 reps, Seated Heel Slides: AROM, Both, 10 reps, Supine Hip ABduction/ADduction: AROM, Both, 10 reps, Supine Straight Leg Raises: AROM, Both, 10 reps, Supine   Assessment/Plan    PT Assessment Patient needs continued PT services  PT Problem List Decreased strength;Decreased range of motion;Decreased activity tolerance;Decreased balance;Decreased mobility;Decreased skin integrity;Pain       PT Treatment Interventions DME instruction;Gait training;Functional mobility training;Therapeutic activities;Therapeutic exercise;Balance training;Cognitive remediation;Patient/family education    PT Goals (Current goals can be found in the Care Plan section)  Acute Rehab PT Goals Patient Stated Goal: walk PT Goal Formulation: With patient/family Time For Goal Achievement: 12/04/21 Potential to Achieve Goals: Good     Frequency Min 3X/week        AM-PAC PT "6 Clicks" Mobility  Outcome Measure Help needed turning from your back to your side while in a flat bed without using bedrails?: None Help needed moving from lying on your back to sitting on the side of a flat bed without using bedrails?: None Help needed moving to and from a bed to a chair (including a wheelchair)?: A Little Help needed standing up from a chair using your arms (e.g., wheelchair or bedside chair)?: A Little Help needed to walk in hospital room?: A Lot Help needed climbing 3-5 steps with a railing? : Total 6 Click Score: 17    End of Session Equipment Utilized During Treatment: Gait belt Activity Tolerance: Patient tolerated treatment well Patient left: in chair;with call bell/phone within reach;with family/visitor present Nurse Communication: Mobility status;Other (comment) (bleeding IV site) PT Visit Diagnosis: Unsteadiness on feet (R26.81);Other abnormalities of gait and mobility (R26.89);Muscle weakness (generalized) (M62.81);Difficulty in walking, not elsewhere classified (R26.2);Pain Pain - Right/Left: Left Pain - part of body: Ankle and joints of foot    Time: 8938-1017 PT Time Calculation (min) (ACUTE ONLY): 32 min   Charges:   PT Evaluation $PT Eval Moderate Complexity: 1 Mod PT Treatments $Therapeutic Activity: 8-22 mins        Barbee Mamula B. Migdalia Dk PT, DPT Acute Rehabilitation Services Please use secure chat or  Call Office [phone]   [place] 11/13/2021, 5:14 PM

## 2021-11-14 DIAGNOSIS — I96 Gangrene, not elsewhere classified: Secondary | ICD-10-CM | POA: Diagnosis not present

## 2021-11-14 LAB — TYPE AND SCREEN
ABO/RH(D): O POS
Antibody Screen: NEGATIVE
Unit division: 0

## 2021-11-14 LAB — BASIC METABOLIC PANEL
Anion gap: 8 (ref 5–15)
BUN: 36 mg/dL — ABNORMAL HIGH (ref 8–23)
CO2: 22 mmol/L (ref 22–32)
Calcium: 8 mg/dL — ABNORMAL LOW (ref 8.9–10.3)
Chloride: 106 mmol/L (ref 98–111)
Creatinine, Ser: 1.84 mg/dL — ABNORMAL HIGH (ref 0.61–1.24)
GFR, Estimated: 35 mL/min — ABNORMAL LOW (ref >60)
Glucose, Bld: 95 mg/dL (ref 70–99)
Potassium: 4.6 mmol/L (ref 3.5–5.1)
Sodium: 136 mmol/L (ref 135–145)

## 2021-11-14 LAB — GLUCOSE, CAPILLARY
Glucose-Capillary: 101 mg/dL — ABNORMAL HIGH (ref 70–99)
Glucose-Capillary: 154 mg/dL — ABNORMAL HIGH (ref 70–99)
Glucose-Capillary: 120 mg/dL — ABNORMAL HIGH (ref 70–99)
Glucose-Capillary: 175 mg/dL — ABNORMAL HIGH (ref 70–99)

## 2021-11-14 LAB — CBC
HCT: 23.8 % — ABNORMAL LOW (ref 39.0–52.0)
Hemoglobin: 7.8 g/dL — ABNORMAL LOW (ref 13.0–17.0)
MCH: 23.7 pg — ABNORMAL LOW (ref 26.0–34.0)
MCHC: 32.8 g/dL (ref 30.0–36.0)
MCV: 72.3 fL — ABNORMAL LOW (ref 80.0–100.0)
Platelets: 211 10*3/uL (ref 150–400)
RBC: 3.29 MIL/uL — ABNORMAL LOW (ref 4.22–5.81)
RDW: 22.2 % — ABNORMAL HIGH (ref 11.5–15.5)
WBC: 11.8 10*3/uL — ABNORMAL HIGH (ref 4.0–10.5)
nRBC: 0 % (ref 0.0–0.2)

## 2021-11-14 LAB — BPAM RBC
Blood Product Expiration Date: 202309222359
ISSUE DATE / TIME: 202308191150
Unit Type and Rh: 5100

## 2021-11-14 MED ORDER — APIXABAN 2.5 MG PO TABS
2.5000 mg | ORAL_TABLET | Freq: Two times a day (BID) | ORAL | Status: DC
  Administered 2021-11-14 – 2021-11-16 (×5): 2.5 mg via ORAL
  Filled 2021-11-14 (×5): qty 1

## 2021-11-14 MED ORDER — TIOTROPIUM BROMIDE MONOHYDRATE 18 MCG IN CAPS
18.0000 ug | ORAL_CAPSULE | Freq: Every day | RESPIRATORY_TRACT | Status: DC
Start: 2021-11-14 — End: 2021-11-14
  Filled 2021-11-14: qty 5

## 2021-11-14 MED ORDER — IPRATROPIUM-ALBUTEROL 0.5-2.5 (3) MG/3ML IN SOLN
3.0000 mL | Freq: Four times a day (QID) | RESPIRATORY_TRACT | Status: DC | PRN
Start: 2021-11-14 — End: 2021-11-16
  Administered 2021-11-14: 3 mL via RESPIRATORY_TRACT
  Filled 2021-11-14: qty 3

## 2021-11-14 MED ORDER — LINEZOLID 600 MG PO TABS
600.0000 mg | ORAL_TABLET | Freq: Two times a day (BID) | ORAL | Status: DC
Start: 2021-11-14 — End: 2021-11-16
  Administered 2021-11-14 – 2021-11-16 (×4): 600 mg via ORAL
  Filled 2021-11-14 (×5): qty 1

## 2021-11-14 MED ORDER — UMECLIDINIUM BROMIDE 62.5 MCG/ACT IN AEPB
1.0000 | INHALATION_SPRAY | Freq: Every day | RESPIRATORY_TRACT | Status: DC
Start: 2021-11-14 — End: 2021-11-16
  Administered 2021-11-14 – 2021-11-16 (×3): 1 via RESPIRATORY_TRACT
  Filled 2021-11-14: qty 7

## 2021-11-14 NOTE — Progress Notes (Signed)
[facility] [place] PROGRESS NOTE  ASSESSMENT / PLAN: Mike Wright is a 88 y.o. male s/p L TMA 11/12/21. No complaints. Plan mobilize with PT today.  Will take bandage down tomorrow.  Safe for discharge from my standpoint. He apparently does not have a ride. He has a postop shoe at home, and did not want me to order a new one. He is OK for WBAT to LLE.  SUBJECTIVE: No complaints.   OBJECTIVE: BP (!) 153/70 (BP Location: Left Arm)   Pulse 71   Temp 97.7 F (36.5 C) (Oral)   Resp 16   Ht '5\' 7"'$  (1.702 m)   Wt 77.1 kg   SpO2 99%   BMI 26.62 kg/m   Intake/Output Summary (Last 24 hours) at 11/14/2021 1435 Last data filed at 11/13/2021 1659 Gross per 24 hour  Intake 336.67 ml  Output 700 ml  Net -363.33 ml     Constitutional: well appearing. no acute distress. Cardiac: RRR. Pulmonary: unlabored Abdomen: soft Vascular: L TMA bandage clean and dry     Latest Ref Rng & Units 11/14/2021    5:47 AM 11/13/2021   10:37 PM 11/13/2021    1:55 AM  CBC  WBC 4.0 - 10.5 K/uL 11.8   9.6   Hemoglobin 13.0 - 17.0 g/dL 7.8  8.0  7.0   Hematocrit 39.0 - 52.0 % 23.8  25.0  22.3   Platelets 150 - 400 K/uL 211   199         Latest Ref Rng & Units 11/14/2021    5:47 AM 11/13/2021    6:23 AM 11/13/2021    1:55 AM  CMP  Glucose 70 - 99 mg/dL 95   294   BUN 8 - 23 mg/dL 36   24   Creatinine 0.61 - 1.24 mg/dL 1.84   1.85   Sodium 135 - 145 mmol/L 136   132   Potassium 3.5 - 5.1 mmol/L 4.6  4.7  5.3   Chloride 98 - 111 mmol/L 106   105   CO2 22 - 32 mmol/L 22   19   Calcium 8.9 - 10.3 mg/dL 8.0   7.7     Estimated Creatinine Clearance: 26.9 mL/min (A) (by C-G formula based on SCr of 1.84 mg/dL (H)).  [name]. Stanford Breed, MD [facility] Phone Number: [phone] 11/14/2021 2:35 PM

## 2021-11-14 NOTE — Anesthesia Postprocedure Evaluation (Signed)
Anesthesia Post Note  Patient: Mike Wright  Procedure(s) Performed: LEFT TRANSMETATARSAL AMPUTATION (Left: Toe)     Patient location during evaluation: PACU Anesthesia Type: Regional and MAC Level of consciousness: awake and alert Pain management: pain level controlled Vital Signs Assessment: post-procedure vital signs reviewed and stable Respiratory status: spontaneous breathing and respiratory function stable Cardiovascular status: stable Postop Assessment: no apparent nausea or vomiting Anesthetic complications: no   No notable events documented.             Deosha Werden DANIEL

## 2021-11-14 NOTE — Progress Notes (Signed)
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  Patient has developed wound which has been spreading from second toe to the surrounding areas associated with foul-smelling discharge.  Patient reports diabetic neuropathy and was prescribed Bactrim and been taking it for last 10 days.  Patient was seen by Dr. Trula Slade in the ED,  recommended TMA amputation .  Patient underwent left TMA, tolerated well, postoperative day 2.  Postoperatively hemoglobin dropped to 7.0 requiring 1 unit of PRBC. Patient participated in physical therapy. Home health services recommended.  Assessment & Plan:   Principal Problem:   Gangrene of left foot (HCC) Active Problems:   Acute kidney injury superimposed on chronic kidney disease (HCC)   Chronic diastolic CHF (congestive heart failure) (HCC)   Essential hypertension   CAD S/P percutaneous coronary angioplasty   PAF (paroxysmal atrial fibrillation) (HCC)   Type 2 diabetes mellitus (HCC)   Malnutrition of moderate degree  Gangrene of left foot: Non healing Diabetic foot wound in the setting of diabetic neuropathy: Patient presented with left foot infection with diabetic foot ulcers, wet gangrene, surrounding cellulitis. MRI concerning for osteomyelitis. Patient was prescribed Bactrim and has been taking it for last 1 week without any improvement. Patient was seen by Dr. Trula Slade (vascular surgeon) in the ED. Patient will need left TMA (transmetatarsal amputation ) Holding Eliquis. Continue IV antibiotics  (Rocephin + Flagyl + linezolid) for the moment. Patient does have history of MRSA infection of the foot in 9/22.   Wound care consulted. He  underwent successful transmetatarsal amputation of left foot.  POD 2. Patient participated in physical therapy. Home health services recommended. Patient will have dressing change today by vascular.  AKI on CKD stage IIIb: Baseline serum creatinine 1.2 -1.58.  Creatinine on admission 2.63 Likely due to infection, decrease p.o. intake and recent antibiotic use. Avoid nephrotoxic medications / Hold Lasix and lisinopril Continue IV hydration.  Renal functions improving. 2.17>1.85>1.84 Consider nephro consult if serum creatinine trending up.  Chronic diastolic CHF: Appears euvolemic, not in any fluid overload. Continue Imdur 60 mg daily Hold Lasix and lisinopril given AKI. Last echo in 12/16/2019 showed LVEF 65 to 70%. 2D echo shows EF 65 to 70%.  No RWMA  Type 2 diabetes: Hold metformin, Continue 20 units of Semglee twice daily Continue moderate scale sliding scale  Paroxysmal atrial fibrillation : Heart rate is well controlled.   Resume Eliquis today.  CAD : Continue Lipitor 80 mg daily,  Held Eliquis and Plavix in the setting of foot gangrene requiring TMA  Resume Eliquis and Plavix today.  Essential hypertension: Continue Imdur, hold lisinopril in the setting of AKI.  Microcytic hypochromic anemia: Baseline Hb remains between 8.5-8.9. He presented with Hb 6.8 requiring 1 unit of PRBC.  Hb dropped to 7.0 postoperatively. Transfuse 1 unit PRBC.Hb 7.8 No signs of any visible / Obvious bleeding.  DVT prophylaxis:  Heparin Code Status:Full code. Family Communication: No family at bedside Disposition Plan:   Status is: Inpatient Remains inpatient appropriate because:  Admitted for gangrenous left foot underwent successful TMA 11/12/2021.  PT and OT evaluation pending Anticipated discharge home 11/15/2021.   Consultants:  Vascular surgery  Procedures: Left TMA 11/12/2021 Antimicrobials:  Anti-infectives (From admission, onward)    Start     Dose/Rate Route Frequency  Ordered Stop   11/11/21 2300  vancomycin (VANCOREADY) IVPB 1250 mg/250 mL  Status:  Discontinued        1,250 mg 166.7 mL/hr over 90 Minutes Intravenous Every 48 hours 11/10/21 2146 11/10/21 2207   11/10/21 2215  linezolid (ZYVOX) tablet 600 mg  Status:  Discontinued        600 mg Oral Every 12 hours 11/10/21 2208 11/10/21 2210   11/10/21 2215  linezolid (ZYVOX) IVPB 600 mg        600 mg 300 mL/hr over 60 Minutes Intravenous Every 12 hours 11/10/21 2210     11/10/21 2200  metroNIDAZOLE (FLAGYL) tablet 500 mg        500 mg Oral Every 12 hours 11/10/21 2138 11/17/21 2159   11/10/21 2200  vancomycin (VANCOREADY) IVPB 1500 mg/300 mL  Status:  Discontinued        1,500 mg 150 mL/hr over 120 Minutes Intravenous  Once 11/10/21 2146 11/10/21 2207   11/10/21 2145  cefTRIAXone (ROCEPHIN) 2 g in sodium chloride 0.9 % 100 mL IVPB        2 g 200 mL/hr over 30 Minutes Intravenous Every 24 hours 11/10/21 2138 11/17/21 2144      Subjective: Patient was seen and examined at bedside. Overnight events noted.  Patient reports feeling better, he reports having pain in the left foot. He underwent successful left TMA on 11/12/21, tolerated well, POD 2.   Objective: Vitals:   11/13/21 1600 11/13/21 2041 11/14/21 0500 11/14/21 0801  BP: 122/67 95/66  (!) 153/70  Pulse: 90 79  71  Resp: (!) '24 20  16  '$ Temp: 98.3 F (36.8 C) 97.9 F (36.6 C)  97.7 F (36.5 C)  TempSrc: Oral Oral  Oral  SpO2: 96% 96%  99%  Weight:   77.1 kg   Height:        Intake/Output Summary (Last 24 hours) at 11/14/2021 1042 Last data filed at 11/13/2021 1659 Gross per 24 hour  Intake 336.67 ml  Output 700 ml  Net -363.33 ml    Filed Weights   11/12/21 1224 11/13/21 0500 11/14/21 0500  Weight: 77.1 kg 77.1 kg 77.1 kg    Examination:  General exam: Appears comfortable, not in acute distress, deconditioned. Respiratory system: CTA bilaterally, no crackles, normal respiratory effort. Cardiovascular system: S1 & S2  heard, regular rate and rhythm, no murmur. Gastrointestinal system: Abdomen is soft, non tender, non distended, BS+ Central nervous system: Alert and oriented x3. No focal neurological deficits. Extremities: Right foot amputated toes, left foot amputated toes covered in dressing. Skin: No rashes, lesions or ulcers Psychiatry: Judgement and insight appear normal. Mood & affect appropriate.     Data Reviewed: I have personally reviewed following labs and imaging studies  CBC: Recent Labs  Lab 11/10/21 2216 11/11/21 0316 11/12/21 0624 11/13/21 0155 11/13/21 2237 11/14/21 0547  WBC 12.1* 10.0 14.6* 9.6  --  11.8*  NEUTROABS 8.2*  --   --   --   --   --   HGB 7.6* 6.8* 7.7* 7.0* 8.0* 7.8*  HCT 24.4* 21.8* 24.0* 22.3* 25.0* 23.8*  MCV 69.7* 69.9* 71.6* 71.9*  --  72.3*  PLT 396 266 250 199  --  696   Basic Metabolic Panel: Recent Labs  Lab 11/10/21 1341 11/11/21 0316 11/12/21 0624 11/13/21 0155 11/13/21 0623 11/14/21 0547  NA 135  135 134* 132*  --  136  K 4.9 4.4 4.1 5.3* 4.7 4.6  CL 100 103 105 105  --  106  CO2 25 23 21* 19*  --  22  GLUCOSE 110* 138* 121* 294*  --  95  BUN 25* 26* 21 24*  --  36*  CREATININE 2.63* 2.61* 2.17* 1.85*  --  1.84*  CALCIUM 8.5* 7.9* 7.9* 7.7*  --  8.0*  MG  --   --  2.2  --   --   --   PHOS  --   --  3.6  --   --   --    GFR: Estimated Creatinine Clearance: 26.9 mL/min (A) (by C-G formula based on SCr of 1.84 mg/dL (H)). Liver Function Tests: No results for input(s): "AST", "ALT", "ALKPHOS", "BILITOT", "PROT", "ALBUMIN" in the last 168 hours. No results for input(s): "LIPASE", "AMYLASE" in the last 168 hours. No results for input(s): "AMMONIA" in the last 168 hours. Coagulation Profile: No results for input(s): "INR", "PROTIME" in the last 168 hours. Cardiac Enzymes: No results for input(s): "CKTOTAL", "CKMB", "CKMBINDEX", "TROPONINI" in the last 168 hours. BNP (last 3 results) No results for input(s): "PROBNP" in the last 8760  hours. HbA1C: No results for input(s): "HGBA1C" in the last 72 hours.  CBG: Recent Labs  Lab 11/13/21 0751 11/13/21 1217 11/13/21 1654 11/13/21 2046 11/14/21 0757  GLUCAP 233* 254* 159* 141* 101*   Lipid Profile: No results for input(s): "CHOL", "HDL", "LDLCALC", "TRIG", "CHOLHDL", "LDLDIRECT" in the last 72 hours. Thyroid Function Tests: No results for input(s): "TSH", "T4TOTAL", "FREET4", "T3FREE", "THYROIDAB" in the last 72 hours. Anemia Panel: No results for input(s): "VITAMINB12", "FOLATE", "FERRITIN", "TIBC", "IRON", "RETICCTPCT" in the last 72 hours. Sepsis Labs: Recent Labs  Lab 11/10/21 1341 11/10/21 2218  LATICACIDVEN 1.5 1.1    Recent Results (from the past 240 hour(s))  MRSA Next Gen by PCR, Nasal     Status: None   Collection Time: 11/12/21  9:08 AM   Specimen: Nasal Mucosa; Nasal Swab  Result Value Ref Range Status   MRSA by PCR Next Gen NOT DETECTED NOT DETECTED Final    Comment: (NOTE) The GeneXpert MRSA Assay (FDA approved for NASAL specimens only), is one component of a comprehensive MRSA colonization surveillance program. It is not intended to diagnose MRSA infection nor to guide or monitor treatment for MRSA infections. Test performance is not FDA approved in patients less than 99 years old. Performed at [facility], [place] [address], [place], [place] [address]     Radiology Studies: No results found.  Scheduled Meds:  sodium chloride   Intravenous Once   atorvastatin  80 mg Oral QPM   feeding supplement  237 mL Oral BID BM   insulin aspart  0-15 Units Subcutaneous TID WC   insulin aspart  0-5 Units Subcutaneous QHS   insulin glargine-yfgn  20 Units Subcutaneous BID   isosorbide mononitrate  60 mg Oral BID   metroNIDAZOLE  500 mg Oral Q12H   multivitamin with minerals  1 tablet Oral Daily   nutrition supplement (JUVEN)  1 packet Oral BID BM   traZODone  150 mg Oral QHS   umeclidinium bromide  1 puff Inhalation Daily   Continuous  Infusions:  sodium chloride 75 mL/hr at 11/14/21 0621   cefTRIAXone (ROCEPHIN)  IV 2 g (11/13/21 2221)   linezolid (ZYVOX) IV 600 mg (11/14/21 0922)     LOS: 4 days    Time  spent: 35 mins    Shawna Clamp, MD [facility]   If 7PM-7AM, please contact night-coverage

## 2021-11-15 ENCOUNTER — Encounter (HOSPITAL_COMMUNITY): Payer: Self-pay | Admitting: Vascular Surgery

## 2021-11-15 DIAGNOSIS — I96 Gangrene, not elsewhere classified: Secondary | ICD-10-CM | POA: Diagnosis not present

## 2021-11-15 LAB — CBC
HCT: 29 % — ABNORMAL LOW (ref 39.0–52.0)
Hemoglobin: 9.1 g/dL — ABNORMAL LOW (ref 13.0–17.0)
MCH: 23.5 pg — ABNORMAL LOW (ref 26.0–34.0)
MCHC: 31.4 g/dL (ref 30.0–36.0)
MCV: 74.7 fL — ABNORMAL LOW (ref 80.0–100.0)
Platelets: 240 10*3/uL (ref 150–400)
RBC: 3.88 MIL/uL — ABNORMAL LOW (ref 4.22–5.81)
RDW: 22.9 % — ABNORMAL HIGH (ref 11.5–15.5)
WBC: 11.4 10*3/uL — ABNORMAL HIGH (ref 4.0–10.5)
nRBC: 0 % (ref 0.0–0.2)

## 2021-11-15 LAB — BASIC METABOLIC PANEL
Anion gap: 8 (ref 5–15)
BUN: 34 mg/dL — ABNORMAL HIGH (ref 8–23)
CO2: 23 mmol/L (ref 22–32)
Calcium: 8.3 mg/dL — ABNORMAL LOW (ref 8.9–10.3)
Chloride: 107 mmol/L (ref 98–111)
Creatinine, Ser: 1.63 mg/dL — ABNORMAL HIGH (ref 0.61–1.24)
GFR, Estimated: 41 mL/min — ABNORMAL LOW (ref >60)
Glucose, Bld: 116 mg/dL — ABNORMAL HIGH (ref 70–99)
Potassium: 4.4 mmol/L (ref 3.5–5.1)
Sodium: 138 mmol/L (ref 135–145)

## 2021-11-15 LAB — GLUCOSE, CAPILLARY
Glucose-Capillary: 136 mg/dL — ABNORMAL HIGH (ref 70–99)
Glucose-Capillary: 174 mg/dL — ABNORMAL HIGH (ref 70–99)
Glucose-Capillary: 179 mg/dL — ABNORMAL HIGH (ref 70–99)

## 2021-11-15 LAB — SURGICAL PATHOLOGY

## 2021-11-15 MED ORDER — CLOPIDOGREL BISULFATE 75 MG PO TABS
75.0000 mg | ORAL_TABLET | Freq: Every day | ORAL | Status: DC
  Administered 2021-11-16: 75 mg via ORAL
  Filled 2021-11-15: qty 1

## 2021-11-15 NOTE — Progress Notes (Signed)
Subjective  - POD #3, status post left transmetatarsal amputation  No acute events overnight   Physical Exam:  Residual erythema on the dorsum of the left foot with a small amount of purulent drainage from his incision       Assessment/Plan:  POD #3  -Patient probably needs another 24 hours of antibiotics -Requesting additional postop shoe, as he wants to wear 1 on both feet so that he does not have balance issues. -We will need 6 weeks of antibiotics, which can be transition to oral tomorrow  Mike Wright 11/15/2021 8:23 AM --  Vitals:   11/14/21 2148 11/15/21 0754  BP: (!) 142/78 (!) 143/67  Pulse: 72 75  Resp:    Temp:  98.7 F (37.1 C)  SpO2: 95% 93%    Intake/Output Summary (Last 24 hours) at 11/15/2021 0823 Last data filed at 11/14/2021 2200 Gross per 24 hour  Intake --  Output 801 ml  Net -801 ml     Laboratory CBC    Component Value Date/Time   WBC 11.4 (H) 11/15/2021 0420   HGB 9.1 (L) 11/15/2021 0420   HGB 12.3 (L) 05/30/2017 1531   HCT 29.0 (L) 11/15/2021 0420   HCT 37.7 05/30/2017 1531   PLT 240 11/15/2021 0420   PLT 212 05/30/2017 1531    BMET    Component Value Date/Time   NA 138 11/15/2021 0420   NA 139 05/30/2017 1531   K 4.4 11/15/2021 0420   CL 107 11/15/2021 0420   CO2 23 11/15/2021 0420   GLUCOSE 116 (H) 11/15/2021 0420   BUN 34 (H) 11/15/2021 0420   BUN 13 05/30/2017 1531   CREATININE 1.63 (H) 11/15/2021 0420   CREATININE 1.00 01/04/2018 1401   CALCIUM 8.3 (L) 11/15/2021 0420   GFRNONAA 41 (L) 11/15/2021 0420   GFRNONAA >60 01/04/2018 1401   GFRAA >60 12/04/2019 1416   GFRAA >60 01/04/2018 1401    COAG Lab Results  Component Value Date   INR 1.3 (H) 09/26/2019   INR 1.2 05/29/2019   INR 1.07 12/13/2017   No results found for: "PTT"  Antibiotics Anti-infectives (From admission, onward)    Start     Dose/Rate Route Frequency Ordered Stop   11/14/21 2200  linezolid (ZYVOX) tablet 600 mg        600 mg Oral  Every 12 hours 11/14/21 1113     11/11/21 2300  vancomycin (VANCOREADY) IVPB 1250 mg/250 mL  Status:  Discontinued        1,250 mg 166.7 mL/hr over 90 Minutes Intravenous Every 48 hours 11/10/21 2146 11/10/21 2207   11/10/21 2215  linezolid (ZYVOX) tablet 600 mg  Status:  Discontinued        600 mg Oral Every 12 hours 11/10/21 2208 11/10/21 2210   11/10/21 2215  linezolid (ZYVOX) IVPB 600 mg  Status:  Discontinued        600 mg 300 mL/hr over 60 Minutes Intravenous Every 12 hours 11/10/21 2210 11/14/21 1113   11/10/21 2200  metroNIDAZOLE (FLAGYL) tablet 500 mg        500 mg Oral Every 12 hours 11/10/21 2138 11/17/21 2159   11/10/21 2200  vancomycin (VANCOREADY) IVPB 1500 mg/300 mL  Status:  Discontinued        1,500 mg 150 mL/hr over 120 Minutes Intravenous  Once 11/10/21 2146 11/10/21 2207   11/10/21 2145  cefTRIAXone (ROCEPHIN) 2 g in sodium chloride 0.9 % 100 mL IVPB  2 g 200 mL/hr over 30 Minutes Intravenous Every 24 hours 11/10/21 2138 11/17/21 2144        V. Leia Alf, M.D., [facility] [place] Office: [phone] Pager:  [phone]

## 2021-11-15 NOTE — Progress Notes (Signed)
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  Patient has developed wound which has been spreading from second toe to the surrounding areas associated with foul-smelling discharge.  Patient reports diabetic neuropathy and was prescribed Bactrim and been taking it for last 10 days.  Patient was seen by Dr. Trula Slade in the ED,  recommended TMA amputation .  Patient underwent left TMA, tolerated well, postoperative day 3.  Patient participated in physical therapy. Home health services recommended.  Patient needs 6 weeks of antibiotics, needs to be changed to p.o. antibiotics tomorrow.  Assessment & Plan:   Principal Problem:   Gangrene of left foot (HCC) Active Problems:   Acute kidney injury superimposed on chronic kidney disease (HCC)   Chronic diastolic CHF (congestive heart failure) (HCC)   Essential hypertension   CAD S/P percutaneous coronary angioplasty   PAF (paroxysmal atrial fibrillation) (HCC)   Type 2 diabetes mellitus (HCC)   Malnutrition of moderate degree  Gangrene of left foot: Non healing Diabetic foot wound in the setting of diabetic neuropathy: Patient presented with left foot infection with diabetic foot ulcers, wet gangrene, surrounding cellulitis. MRI concerning for osteomyelitis. Patient was prescribed Bactrim and has been taking it for last 1 week without any improvement. Patient was seen by Dr. Trula Slade (vascular surgeon) in the ED. Continue IV antibiotics  (Rocephin + Flagyl + linezolid) for the moment. Patient does have history of MRSA infection of the foot in 9/22.   Wound care consulted. 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   POD 3. Patient participated in physical therapy. Home health services recommended. Patient will need 1 more days of IV antibiotics. Patient reassessed by Dr. Trula Slade, Patient will need antibiotics for 6 weeks.  AKI on CKD stage IIIb: Improving Baseline serum creatinine 1.2 -1.58.  Creatinine on admission 2.63 Likely due to infection, decrease p.o. intake and recent antibiotic use. Avoid nephrotoxic medications / Hold Lasix and lisinopril Continue IV hydration.  Renal functions improving. 9.93>7.16>9.67>8.93  Chronic diastolic CHF: Appears euvolemic, not in any fluid overload. Continue Imdur 60 mg daily Hold Lasix and lisinopril given AKI. Last echo in 12/16/2019 showed LVEF 65 to 70%. 2D echo shows EF 65 to 70%.  No RWMA  Type 2 diabetes: Hold metformin, Continue 20 units of Semglee twice daily Continue moderate scale sliding scale  Paroxysmal atrial fibrillation : Heart rate is well controlled.   Continue Eliquis.  CAD : Continue Lipitor 80 mg daily,  Eliquis and Plavix were held in the setting of foot gangrene requiring TMA  Eliquis resumed yesterday,  Plavix resumed today.  Essential hypertension: Continue Imdur, hold lisinopril in the setting of AKI.  Microcytic hypochromic anemia: Baseline Hb remains between 8.5-8.9. He presented with Hb 6.8 requiring 1 unit of PRBC.  Hb dropped to 7.0 postoperatively. Transfuse 1 unit PRBC.Hb 7.8 No signs of any visible / Obvious bleeding.  DVT prophylaxis:  Heparin Code Status:Full code. Family Communication: Wife at bedside. Disposition Plan:   Status is: Inpatient Remains inpatient appropriate because:  Admitted for gangrenous left foot underwent successful TMA 11/12/2021. PT and OT recommended home health services. Patient still has a lot of erythema around  the wound,  needs IV antibiotics.  Anticipated discharge home 11/16/2021.   Consultants:  Vascular surgery  Procedures: Left TMA 11/12/2021 Antimicrobials:   Anti-infectives (From admission, onward)    Start     Dose/Rate Route Frequency Ordered Stop   11/14/21 2200  linezolid (ZYVOX) tablet 600 mg        600 mg Oral Every 12 hours 11/14/21 1113     11/11/21 2300  vancomycin (VANCOREADY) IVPB 1250 mg/250 mL  Status:  Discontinued        1,250 mg 166.7 mL/hr over 90 Minutes Intravenous Every 48 hours 11/10/21 2146 11/10/21 2207   11/10/21 2215  linezolid (ZYVOX) tablet 600 mg  Status:  Discontinued        600 mg Oral Every 12 hours 11/10/21 2208 11/10/21 2210   11/10/21 2215  linezolid (ZYVOX) IVPB 600 mg  Status:  Discontinued        600 mg 300 mL/hr over 60 Minutes Intravenous Every 12 hours 11/10/21 2210 11/14/21 1113   11/10/21 2200  metroNIDAZOLE (FLAGYL) tablet 500 mg        500 mg Oral Every 12 hours 11/10/21 2138 11/17/21 2159   11/10/21 2200  vancomycin (VANCOREADY) IVPB 1500 mg/300 mL  Status:  Discontinued        1,500 mg 150 mL/hr over 120 Minutes Intravenous  Once 11/10/21 2146 11/10/21 2207   11/10/21 2145  cefTRIAXone (ROCEPHIN) 2 g in sodium chloride 0.9 % 100 mL IVPB        2 g 200 mL/hr over 30 Minutes Intravenous Every 24 hours 11/10/21 2138 11/17/21 2144      Subjective: Patient was seen and examined at bedside. Overnight events noted.  Patient reports feeling better, he is still reports having pain in the left foot. He underwent successful left TMA on 11/12/21, tolerated well, POD 3. Wound has a lot of erythema around the staples with some discharge.   Objective: Vitals:   11/14/21 2148 11/15/21 0500 11/15/21 0754 11/15/21 0905  BP: (!) 142/78  (!) 143/67   Pulse: 72  75   Resp:      Temp:   98.7 F (37.1 C)   TempSrc:   Oral   SpO2: 95%  93% 96%  Weight:  77.3 kg    Height:        Intake/Output Summary (Last 24 hours) at 11/15/2021 1142 Last data filed at 11/15/2021 0900 Gross per 24 hour  Intake 240 ml  Output 1601 ml  Net -1361 ml    Filed Weights   11/13/21 0500 11/14/21 0500 11/15/21 0500   Weight: 77.1 kg 77.1 kg 77.3 kg    Examination:  General exam: Appears comfortable, not in any acute distress.  Deconditioned Respiratory system: CTA bilaterally, normal respiratory effort, RR 15 Cardiovascular system: S1 & S2 heard, regular rate and rhythm, no murmur. Gastrointestinal system: Abdomen is soft, non tender, non distended, BS+ Central nervous system: Alert and oriented x3. No focal neurological deficits. Extremities: Right foot amputated toes, No signs of erythema, left foot amputated toes with staples,  erythema around the wound skin: No rashes, lesions or ulcers Psychiatry: Judgement and insight appear normal. Mood & affect appropriate.     Data Reviewed: I have personally reviewed following labs and imaging studies  CBC: Recent Labs  Lab 11/10/21 2216 11/11/21 0316 11/12/21 0624 11/13/21 0155 11/13/21 2237 11/14/21 0547 11/15/21 0420  WBC 12.1* 10.0 14.6* 9.6  --  11.8* 11.4*  NEUTROABS 8.2*  --   --   --   --   --   --  HGB 7.6* 6.8* 7.7* 7.0* 8.0* 7.8* 9.1*  HCT 24.4* 21.8* 24.0* 22.3* 25.0* 23.8* 29.0*  MCV 69.7* 69.9* 71.6* 71.9*  --  72.3* 74.7*  PLT 396 266 250 199  --  211 784   Basic Metabolic Panel: Recent Labs  Lab 11/11/21 0316 11/12/21 0624 11/13/21 0155 11/13/21 0623 11/14/21 0547 11/15/21 0420  NA 135 134* 132*  --  136 138  K 4.4 4.1 5.3* 4.7 4.6 4.4  CL 103 105 105  --  106 107  CO2 23 21* 19*  --  22 23  GLUCOSE 138* 121* 294*  --  95 116*  BUN 26* 21 24*  --  36* 34*  CREATININE 2.61* 2.17* 1.85*  --  1.84* 1.63*  CALCIUM 7.9* 7.9* 7.7*  --  8.0* 8.3*  MG  --  2.2  --   --   --   --   PHOS  --  3.6  --   --   --   --    GFR: Estimated Creatinine Clearance: 30.4 mL/min (A) (by C-G formula based on SCr of 1.63 mg/dL (H)). Liver Function Tests: No results for input(s): "AST", "ALT", "ALKPHOS", "BILITOT", "PROT", "ALBUMIN" in the last 168 hours. No results for input(s): "LIPASE", "AMYLASE" in the last 168 hours. No results  for input(s): "AMMONIA" in the last 168 hours. Coagulation Profile: No results for input(s): "INR", "PROTIME" in the last 168 hours. Cardiac Enzymes: No results for input(s): "CKTOTAL", "CKMB", "CKMBINDEX", "TROPONINI" in the last 168 hours. BNP (last 3 results) No results for input(s): "PROBNP" in the last 8760 hours. HbA1C: No results for input(s): "HGBA1C" in the last 72 hours.  CBG: Recent Labs  Lab 11/13/21 2046 11/14/21 0757 11/14/21 1217 11/14/21 1625 11/14/21 2142  GLUCAP 141* 101* 154* 120* 175*   Lipid Profile: No results for input(s): "CHOL", "HDL", "LDLCALC", "TRIG", "CHOLHDL", "LDLDIRECT" in the last 72 hours. Thyroid Function Tests: No results for input(s): "TSH", "T4TOTAL", "FREET4", "T3FREE", "THYROIDAB" in the last 72 hours. Anemia Panel: No results for input(s): "VITAMINB12", "FOLATE", "FERRITIN", "TIBC", "IRON", "RETICCTPCT" in the last 72 hours. Sepsis Labs: Recent Labs  Lab 11/10/21 1341 11/10/21 2218  LATICACIDVEN 1.5 1.1    Recent Results (from the past 240 hour(s))  MRSA Next Gen by PCR, Nasal     Status: None   Collection Time: 11/12/21  9:08 AM   Specimen: Nasal Mucosa; Nasal Swab  Result Value Ref Range Status   MRSA by PCR Next Gen NOT DETECTED NOT DETECTED Final    Comment: (NOTE) The GeneXpert MRSA Assay (FDA approved for NASAL specimens only), is one component of a comprehensive MRSA colonization surveillance program. It is not intended to diagnose MRSA infection nor to guide or monitor treatment for MRSA infections. Test performance is not FDA approved in patients less than 37 years old. Performed at [facility], [place] [address] [place], [place] [address]     Radiology Studies: No results found.  Scheduled Meds:  sodium chloride   Intravenous Once   apixaban  2.5 mg Oral BID   atorvastatin  80 mg Oral QPM   [START ON 11/16/2021] clopidogrel  75 mg Oral Q breakfast   feeding supplement  237 mL Oral BID BM   insulin aspart   0-15 Units Subcutaneous TID WC   insulin aspart  0-5 Units Subcutaneous QHS   insulin glargine-yfgn  20 Units Subcutaneous BID   isosorbide mononitrate  60 mg Oral BID   linezolid  600  mg Oral Q12H   metroNIDAZOLE  500 mg Oral Q12H   multivitamin with minerals  1 tablet Oral Daily   nutrition supplement (JUVEN)  1 packet Oral BID BM   traZODone  150 mg Oral QHS   umeclidinium bromide  1 puff Inhalation Daily   Continuous Infusions:  cefTRIAXone (ROCEPHIN)  IV 2 g (11/14/21 2152)     LOS: 5 days    Time spent: 35 mins    Mike Barnick, MD [facility]   If 7PM-7AM, please contact night-coverage

## 2021-11-15 NOTE — Evaluation (Addendum)
Occupational Therapy Evaluation Patient Details Name: Mike Wright MRN: [id number] DOB: 1933/10/26 Today's Date: 11/15/2021   History of Present Illness 88 year old male admitted 8/16 with gangrenous changes of the left foot s/p 8/18 L transmetatarsal amputation. PMH:DM2, PAD, CAD, A.Fib on eliquis   Clinical Impression   Pt independent at baseline with ADLs and functional mobility, currently needing min-mod A for ADLs, supervision for bed mobility, and mod A for transfers with RW. Noted pt with updated WB orders, now WBAT LLE, however pt still preferring WB in heel only with transfers. Pt/spouse have post op shoe at home for RLE, however, requesting one for LLE as well to "feel more balanced" when walking, RN notified. Pt presenting with impairments listed below, will follow acutely. Recommend HHOT at d/c.     Recommendations for follow up therapy are one component of a multi-disciplinary discharge planning process, led by the attending physician.  Recommendations may be updated based on patient status, additional functional criteria and insurance authorization.   Follow Up Recommendations  Home health OT    Assistance Recommended at Discharge Intermittent Supervision/Assistance  Patient can return home with the following A lot of help with walking and/or transfers;A little help with bathing/dressing/bathroom;Assistance with cooking/housework;Assist for transportation;Direct supervision/assist for financial management;Direct supervision/assist for medications management;Help with stairs or ramp for entrance    Functional Status Assessment  Patient has had a recent decline in their functional status and demonstrates the ability to make significant improvements in function in a reasonable and predictable amount of time.  Equipment Recommendations  BSC/3in1    Recommendations for Other Services PT consult     Precautions / Restrictions Restrictions Weight Bearing Restrictions:  Yes LLE Weight Bearing: Weight bearing as tolerated Other Position/Activity Restrictions: now WBAT per vascular surgery note 8/20; with note post op shoe is at home     Mobility Bed Mobility Overal bed mobility: Needs Assistance Bed Mobility: Supine to Sit, Sit to Supine     Supine to sit: Min guard, HOB elevated Sit to supine: Supervision        Transfers Overall transfer level: Needs assistance Equipment used: Rolling walker (2 wheels) Transfers: Sit to/from Stand, Bed to chair/wheelchair/BSC Sit to Stand: Mod assist                  Balance Overall balance assessment: Needs assistance Sitting-balance support: Feet supported, No upper extremity supported, Single extremity supported, Bilateral upper extremity supported Sitting balance-Leahy Scale: Fair     Standing balance support: During functional activity, Reliant on assistive device for balance Standing balance-Leahy Scale: Poor Standing balance comment: reliant on external support                           ADL either performed or assessed with clinical judgement   ADL Overall ADL's : Needs assistance/impaired Eating/Feeding: Independent   Grooming: Modified independent   Upper Body Bathing: Minimal assistance   Lower Body Bathing: Moderate assistance   Upper Body Dressing : Minimal assistance   Lower Body Dressing: Minimal assistance   Toilet Transfer: Moderate assistance   Toileting- Clothing Manipulation and Hygiene: Minimal assistance       Functional mobility during ADLs: Minimal assistance;Rolling walker (2 wheels)       Vision Baseline Vision/History: 1 Wears glasses Vision Assessment?: No apparent visual deficits     Perception     Praxis      Pertinent Vitals/Pain Pain Assessment Pain Assessment: Faces Pain Score: 0-No pain  Faces Pain Scale: No hurt Pain Location: L foot surgical site Pain Descriptors / Indicators: Sore, Throbbing Pain Intervention(s): Limited  activity within patient's tolerance, Monitored during session, Repositioned     Hand Dominance     Extremity/Trunk Assessment Upper Extremity Assessment Upper Extremity Assessment: Generalized weakness   Lower Extremity Assessment Lower Extremity Assessment: Defer to PT evaluation   Cervical / Trunk Assessment Cervical / Trunk Assessment: Normal   Communication Communication Communication: No difficulties   Cognition Arousal/Alertness: Awake/alert Behavior During Therapy: WFL for tasks assessed/performed                                         General Comments  VSS on RA, wife present throughout session    Exercises     Shoulder Instructions      Home Living Family/patient expects to be discharged to:: Private residence Living Arrangements: Spouse/significant other Available Help at Discharge: Family;Available 24 hours/day Type of Home: House Home Access: Ramped entrance     Home Layout: One level     Bathroom Shower/Tub:  (walk in tub with handheld shower)   Bathroom Toilet: Standard Bathroom Accessibility: No   Home Equipment: Tub bench;Grab bars - toilet;Grab bars - tub/shower;Hand held shower head;Cane - single point;Rolling Walker (2 wheels);Wheelchair - manual          Prior Functioning/Environment Prior Level of Function : Independent/Modified Independent;Driving             Mobility Comments: ambulating with cane, reports using RW on "bad days" ADLs Comments: no assist needed        OT Problem List: Decreased strength;Decreased range of motion;Decreased activity tolerance;Impaired balance (sitting and/or standing);Decreased safety awareness      OT Treatment/Interventions: Self-care/ADL training;Therapeutic exercise;DME and/or AE instruction;Therapeutic activities;Patient/family education;Balance training    OT Goals(Current goals can be found in the care plan section) Acute Rehab OT Goals Patient Stated Goal: none  stated OT Goal Formulation: With patient Time For Goal Achievement: 11/29/21 Potential to Achieve Goals: Good ADL Goals Pt Will Perform Upper Body Dressing: Independently;sitting;standing Pt Will Perform Lower Body Dressing: with min guard assist;sit to/from stand;sitting/lateral leans Pt Will Transfer to Toilet: with supervision;ambulating;regular height toilet;bedside commode Pt Will Perform Tub/Shower Transfer: Tub transfer;Shower transfer;ambulating;tub bench;with min guard assist Additional ADL Goal #1: pt will complete bed mobility independently in prep for ADL  OT Frequency: Min 2X/week    Co-evaluation              AM-PAC OT "6 Clicks" Daily Activity     Outcome Measure Help from another person eating meals?: None Help from another person taking care of personal grooming?: None Help from another person toileting, which includes using toliet, bedpan, or urinal?: A Lot Help from another person bathing (including washing, rinsing, drying)?: A Lot Help from another person to put on and taking off regular upper body clothing?: A Little Help from another person to put on and taking off regular lower body clothing?: A Little 6 Click Score: 18   End of Session Equipment Utilized During Treatment: Gait belt;Rolling walker (2 wheels) Nurse Communication: Mobility status;Other (comment) (notified of needing second post op shoe for L foot, has R foot post op shoe at home)  Activity Tolerance: Patient tolerated treatment well Patient left: in bed;with call bell/phone within reach;with bed alarm set  OT Visit Diagnosis: Unsteadiness on feet (R26.81);Other abnormalities of gait and mobility (R26.89);Muscle  weakness (generalized) (M62.81)                Time: 3888-2800 OT Time Calculation (min): 24 min Charges:  OT General Charges $OT Visit: 1 Visit OT Evaluation $OT Eval Moderate Complexity: 1 Mod OT Treatments $Self Care/Home Management : 8-22 mins  Lynnda Child, OTD,  OTR/L Acute Rehab [phone]) [phone] - [place] 11/15/2021, 9:59 AM

## 2021-11-15 NOTE — TOC Progression Note (Signed)
Transition of Care [facility]) - Progression Note    Patient Details  Name: Mike Wright MRN: [id number] Date of Birth: Jan 26, 1934  Transition of [facility]) CM/SW [place], RN Phone Number: 11/15/2021, 10:57 AM  Clinical Narrative:    CM met with the patient and wife at the bedside to discuss transitions of care to home.  The patient lives with his wife at the home and has DME at the home including home oxygen PRN as needed, WC, RW, Rollator, and Cane.  Bedside nursing will contact ortho tech to order a post op shoe for the patient.  The patient has a post-op shoe at the house to his Right foot at home at this time since he has history of amputation in the past.  Staples are present on his Left foot at this time that is clean, dry and intact without dressing noted.  I offered Medicare choice regarding Home health services and the patient chose [facility].  I called [name] and they accepted the patient for home health services for PT/OT.  RN will be added at a later date if needed in the home.  [employer] was called and 3:1 was ordered to deliver to the patient's room today - pending discharge to home in next 1-2 days.  The patient's wife plans to have a neighbor provide transportation to the home.  The patient states that he has Home oxygen through [employer] and will not need portable tank to be delivered to the hospital room prior to discharge.   Expected Discharge Plan: [place] Barriers to Discharge: Continued Medical Work up  Expected Discharge Plan and Services Expected Discharge Plan: [place]   Discharge Planning Services: CM Consult Post Acute Care Choice: Durable Medical Equipment, Home Health Living arrangements for the past 2 months: Single Family Home                 DME Arranged: 3-N-1 DME Agency: [employer] Date DME Agency Contacted: 11/15/21 Time DME Agency Contacted: 1053 Representative spoke with at DME Agency:  Jeanella Anton, [place] with [employer] HH Arranged: PT, OT [place] Agency: [place] Date [place]: 11/15/21 Time [place]: 84 Representative spoke with at [place]: Jeanella Anton, [place] with [employer]   Social Determinants of Health ([place]) Interventions    Readmission Risk Interventions    11/15/2021   10:56 AM 12/18/2020    2:00 PM 02/07/2020   12:11 PM  Readmission Risk Prevention Plan  Transportation Screening Complete Complete Complete  PCP or Specialist Appt within 5-7 Days  Complete   PCP or Specialist Appt within 3-5 Days Complete  Complete  Home Care Screening  Complete   Medication Review (RN CM)  Complete   HRI or Home Care Consult Complete  Complete  Social Work Consult for [place] Planning/Counseling Complete  Complete  Palliative Care Screening Complete  Not Applicable  Medication Review Press photographer) Complete  Complete

## 2021-11-16 ENCOUNTER — Other Ambulatory Visit (HOSPITAL_COMMUNITY): Payer: Self-pay

## 2021-11-16 DIAGNOSIS — I96 Gangrene, not elsewhere classified: Secondary | ICD-10-CM | POA: Diagnosis not present

## 2021-11-16 LAB — CBC
HCT: 26.5 % — ABNORMAL LOW (ref 39.0–52.0)
Hemoglobin: 8.4 g/dL — ABNORMAL LOW (ref 13.0–17.0)
MCH: 23.3 pg — ABNORMAL LOW (ref 26.0–34.0)
MCHC: 31.7 g/dL (ref 30.0–36.0)
MCV: 73.4 fL — ABNORMAL LOW (ref 80.0–100.0)
Platelets: 208 10*3/uL (ref 150–400)
RBC: 3.61 MIL/uL — ABNORMAL LOW (ref 4.22–5.81)
RDW: 23.1 % — ABNORMAL HIGH (ref 11.5–15.5)
WBC: 10.2 10*3/uL (ref 4.0–10.5)
nRBC: 0 % (ref 0.0–0.2)

## 2021-11-16 LAB — GLUCOSE, CAPILLARY
Glucose-Capillary: 91 mg/dL (ref 70–99)
Glucose-Capillary: 152 mg/dL — ABNORMAL HIGH (ref 70–99)

## 2021-11-16 LAB — BASIC METABOLIC PANEL
Anion gap: 8 (ref 5–15)
BUN: 22 mg/dL (ref 8–23)
CO2: 19 mmol/L — ABNORMAL LOW (ref 22–32)
Calcium: 8.2 mg/dL — ABNORMAL LOW (ref 8.9–10.3)
Chloride: 110 mmol/L (ref 98–111)
Creatinine, Ser: 1.32 mg/dL — ABNORMAL HIGH (ref 0.61–1.24)
GFR, Estimated: 53 mL/min — ABNORMAL LOW (ref >60)
Glucose, Bld: 95 mg/dL (ref 70–99)
Potassium: 4.1 mmol/L (ref 3.5–5.1)
Sodium: 137 mmol/L (ref 135–145)

## 2021-11-16 MED ORDER — SULFAMETHOXAZOLE-TRIMETHOPRIM 800-160 MG PO TABS
1.0000 | ORAL_TABLET | Freq: Two times a day (BID) | ORAL | 0 refills | Status: DC
  Filled 2021-11-16: qty 84, 42d supply, fill #0

## 2021-11-16 MED ORDER — SULFAMETHOXAZOLE-TRIMETHOPRIM 800-160 MG PO TABS: 1.0000 | ORAL_TABLET | Freq: Two times a day (BID) | ORAL | 0 refills | Status: AC

## 2021-11-16 NOTE — Discharge Instructions (Signed)
Advised to follow-up with primary care physician in 1 week. Advised to follow-up with vascular surgery Dr. Silvano Bilis in 2 weeks. Advised to take Bactrim DS twice a day for 6 weeks for cellulitis.

## 2021-11-16 NOTE — Progress Notes (Signed)
Discharge AVS printed and reviewed with patient and wife. Wound care instructions reviewed. All question/concerns addressed. Peripheral IV removed.   To be discharged home via private auto with home health. Family called, ride will arrive for patient around 2:30 pm

## 2021-11-16 NOTE — Discharge Summary (Signed)
Physician Discharge Summary  Mike Wright WNI:[id number] DOB: 13-Jul-1933 DOA: 11/10/2021  PCP: Monico Blitz, MD  Admit date: 11/10/2021  Discharge date: 11/16/2021  Admitted From: Home.  Disposition:  Home Health services.  Recommendations for Outpatient Follow-up:  Follow up with PCP in 1-2 weeks. Please obtain BMP/CBC in one week. Advised to follow-up with vascular surgery Dr. Serita Sheller in 2 weeks. Advised to take Bactrim DS twice a day for 6 weeks for cellulitis.   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  Patient has developed wound which has been spreading from second toe to the surrounding areas associated with foul-smelling discharge.  Patient reports diabetic neuropathy and was prescribed Bactrim and been taking it for last 10 days.  Patient was seen by Dr. Trula Slade in the ED,  recommended  Left TMA amputation .  Patient underwent left TMA, tolerated well, postoperative day 4.  Patient participated in physical therapy. Home health services recommended.  Patient cleared from vascular surgery to be discharged on Bactrim twice daily for 6 weeks.  Patient feels better and wants to be discharged.  Discharge Diagnoses:  Principal Problem:   Gangrene of left foot ([place]) Active Problems:   Acute kidney injury superimposed on chronic kidney disease (HCC)   Chronic diastolic CHF (congestive heart failure) (HCC)   Essential hypertension   CAD S/P percutaneous coronary angioplasty   PAF (paroxysmal atrial fibrillation) (HCC)   Type 2 diabetes mellitus (HCC)   Malnutrition of moderate degree  Gangrene of Left foot: Non healing  Diabetic foot wound in the setting of diabetic neuropathy: Patient presented with left foot infection with diabetic foot ulcers, wet gangrene, surrounding cellulitis. MRI concerning for osteomyelitis. Patient was prescribed Bactrim and has been taking it for last 1 week without any improvement. Patient was seen by Dr. Trula Slade (vascular surgeon) in the ED. Continue IV antibiotics  (Rocephin + Flagyl + linezolid) for the moment. Patient does have history of MRSA infection of the foot in 9/22.   Wound care consulted. 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  POD 4. Patient participated in physical therapy. Home health services recommended. Patient reassessed by Dr. Trula Slade, Patient will need antibiotics for 6 weeks. Patient is being discharged home on Bactrim Ds twice a day for 6 weeks.   AKI on CKD stage IIIb: Improved. Baseline serum creatinine 1.2 -1.58.  Creatinine on admission 2.63 Likely due to infection, decrease p.o. intake and recent antibiotic use. Avoid nephrotoxic medications / Hold Lasix and lisinopril Continue IV hydration.  Renal functions improving. 2.17>1.85>1.84>1.63>1.32   Chronic diastolic CHF: Appears euvolemic, not in any fluid overload. Continue Imdur 60 mg daily Hold Lasix and lisinopril given AKI. Last echo in 12/16/2019 showed LVEF 65 to 70%. 2D echo shows EF 65 to 70%.  No RWMA   Type 2 diabetes: Hold metformin, Continue 20 units of Semglee twice daily Continue moderate scale sliding scale   Paroxysmal atrial fibrillation : Heart rate is well controlled.   Continue Eliquis.   CAD : Continue Lipitor 80 mg daily,  Continue Eliquis and Plavix.   Essential hypertension: Continue Imdur, hold lisinopril in the setting of AKI.   Microcytic hypochromic anemia: Baseline  Hb remains between 8.5-8.9. He presented with Hb 6.8 requiring 1 unit of PRBC.  Hb dropped to 7.0 postoperatively. Transfuse 1 unit PRBC.Hb 7.8 No signs of any visible  / Obvious bleeding.  Discharge Instructions  Discharge Instructions     Call MD for:  difficulty breathing, headache or visual disturbances   Complete by: As directed    Call MD for:  persistant nausea and vomiting   Complete by: As directed    Call MD for:  severe uncontrolled pain   Complete by: As directed    Diet - low sodium heart healthy   Complete by: As directed    Diet Carb Modified   Complete by: As directed    Discharge instructions   Complete by: As directed    Advised to follow-up with primary care physician in 1 week. Advised to follow-up with vascular surgery Dr. Silvano Bilis in 2 weeks. Advised to take Bactrim DS twice a day for 6 weeks for cellulitis.   Discharge wound care:   Complete by: As directed    Advised to follow-up with vascular surgery in 2 weeks.   Increase activity slowly   Complete by: As directed       Allergies as of 11/16/2021       Reactions   Codeine Shortness Of Breath   Doxycycline Swelling   Swelling and numbness in lips and face. Swelling improved after stopping. Reports still experiences numbness in bottom lip.    Feraheme [ferumoxytol] Other (See Comments)   Diaphoretic, chest pain   Heparin Other (See Comments)   +HIT,  Severe bleeding (with heparin drip & large doses), tolerates low doses   Iron Shortness Of Breath   Losartan Swelling   Oxycodone Other (See Comments)   "Made me act out of my mind" Mental status changes- hallucinations   Latex Rash        Medication List     STOP taking these medications    furosemide 80 MG tablet Commonly known as: LASIX   lisinopril 5 MG tablet Commonly known as: ZESTRIL   metFORMIN 1000 MG tablet Commonly known as: GLUCOPHAGE       TAKE these medications    Accu-Chek Guide w/Device Kit   Accu-Chek Softclix Lancets lancets   acetaminophen 500 MG tablet Commonly known as: TYLENOL Take 1 tablet (500 mg total) by mouth every 6 (six) hours as needed for mild pain or  headache. What changed: how much to take   apixaban 2.5 MG Tabs tablet Commonly known as: ELIQUIS Take 1 tablet (2.5 mg total) by mouth 2 (two) times daily. What changed: how much to take   atorvastatin 80 MG tablet Commonly known as: LIPITOR Take 80 mg by mouth every evening.   B-12 2500 MCG Tabs Take 2,500 mcg by mouth daily.   B-D SINGLE USE SWABS REGULAR Pads   clopidogrel 75 MG tablet Commonly known as: PLAVIX Take 1 tablet (75 mg total) by mouth daily with breakfast.   Droplet Insulin Syringe 31G X 5/16" 1 ML Misc Generic drug: Insulin Syringe-Needle A-165   folic acid 1 MG tablet Commonly known as: FOLVITE Take 1 tablet (1 mg total) by mouth daily.   insulin glargine 100 UNIT/ML injection Commonly known as: LANTUS Inject 30 Units into the skin 2 (two) times daily.   ipratropium-albuterol 0.5-2.5 (3) MG/3ML Soln Commonly known as: DUONEB Take 3 mLs by nebulization every other day.   isosorbide mononitrate 60 MG 24 hr tablet Commonly known as: IMDUR Take 1  tablet (60 mg total) by mouth 2 (two) times daily.   nitroGLYCERIN 0.4 MG SL tablet Commonly known as: NITROSTAT PLACE 1 TABLET (0.4 MG TOTAL) UNDER THE TONGUE EVERY 5  MINUTES AS NEEDED FOR CHEST PAIN. What changed: See the new instructions.   nystatin 100000 UNIT/ML suspension Commonly known as: MYCOSTATIN Take by mouth.   OXYGEN Inhale 2 L/min into the lungs See admin instructions. 2 L/min of oxygen at bedtime and during the day as needed for shortness of breath   pantoprazole 40 MG tablet Commonly known as: Protonix Take 1 tablet (40 mg total) by mouth 2 (two) times daily. To protect stomach while taking multiple blood thinners. What changed: when to take this   potassium chloride SA 20 MEQ tablet Commonly known as: KLOR-CON M Take 1 tablet (20 mEq total) by mouth daily.   Spiriva HandiHaler 18 MCG inhalation capsule Generic drug: tiotropium Place 1 capsule into inhaler and inhale daily after  breakfast.   sulfamethoxazole-trimethoprim 800-160 MG tablet Commonly known as: BACTRIM DS Take 1 tablet by mouth 2 (two) times daily.   traMADol 50 MG tablet Commonly known as: Ultram Take 1 tablet (50 mg total) by mouth every 6 (six) hours as needed.   traZODone 100 MG tablet Commonly known as: DESYREL Take 150 mg by mouth at bedtime.   True Metrix Blood Glucose Test test strip Generic drug: glucose blood   Vitamin D3 50 MCG (2000 UT) Tabs Take 2,000 Units by mouth daily.               Durable Medical Equipment  (From admission, onward)           Start     Ordered   11/15/21 1051  For home use only DME 3 n 1  Once        11/15/21 1050              Discharge Care Instructions  (From admission, onward)           Start     Ordered   11/16/21 0000  Discharge wound care:       Comments: Advised to follow-up with vascular surgery in 2 weeks.   11/16/21 [place] Follow up.   Why: [employer] will be providing a 3:1 to be delivered to the hospital room today - 11/15/2021. Contact information: [place] [address] [phone]         [facility] Follow up.   Specialty: Home Health Services Why: [name] will be providing PT/OT home health .  They will contact you in the next 24-48 hours to set up services at the home. Contact information: [address] [place] [address] [phone]         Ralph Leyden, FNP. Schedule an appointment as soon as possible for a visit.   Specialty: Family Medicine Why: Please schedule a clinic visit in the next week with your primary care provider. Contact information: [place] [address] [phone]         [facility] Follow up in 3 week(s).   Specialty: Vascular Surgery Why: Office will call to arrange your appt(s) (sent) Contact information: [address] [place] Follow up in 1 week(s).   Specialty:  Internal Medicine Contact information: [place] [address] [phone]         Arnoldo Lenis, MD .   Specialty: Cardiology Contact information: [place] [address] [phone]                Allergies  Allergen Reactions   Codeine Shortness Of Breath   Doxycycline Swelling    Swelling and numbness in lips and face. Swelling improved after stopping. Reports still experiences numbness in bottom lip.    Feraheme [Ferumoxytol] Other (See Comments)    Diaphoretic, chest pain   Heparin Other (See Comments)    +HIT,  Severe bleeding (with heparin drip & large doses), tolerates low doses   Iron Shortness Of Breath   Losartan Swelling   Oxycodone Other (See Comments)    "Made me act out of my mind" Mental status changes- hallucinations   Latex Rash    Consultations: Vascular surgery.    Procedures/Studies: ECHOCARDIOGRAM COMPLETE  Result Date: 11/11/2021    ECHOCARDIOGRAM REPORT   Patient Name:   DARCEY DEMMA Date of Exam: 11/11/2021 Medical Rec #:  [id number]       Height:       67.0 in Accession #:    [id number]      Weight:       181.4 lb Date of Birth:  12/08/1933       BSA:          1.940 m Patient Age:    6 years        BP:           128/73 mmHg Patient Gender: M               HR:           54 bpm. Exam Location:  Inpatient Procedure: 2D Echo, Cardiac Doppler, Color Doppler and Intracardiac            Opacification Agent Indications:    Chest Pain  History:        Patient has prior history of Echocardiogram examinations, most                 recent 01/29/2020. CAD, PAD and COPD, Arrythmias:Atrial                 Fibrillation and Bradycardia; Risk Factors:Hypertension,                 Diabetes and Former Smoker.  Sonographer:    Leavy Cella RDCS Referring Phys: [place]  1. Left  ventricular ejection fraction, by estimation, is 70 to 75%. Left ventricular ejection fraction by PLAX is 71 %. The left ventricle has hyperdynamic function. The left ventricle has no regional wall motion abnormalities. Left ventricular diastolic function could not be evaluated.  2. Right ventricular systolic function is hyperdynamic. The right ventricular size is normal. Tricuspid regurgitation signal is inadequate for assessing PA pressure.  3. Left atrial size was severely dilated.  4. The mitral valve is abnormal. Trivial mitral valve regurgitation. Moderate mitral annular calcification.  5. The aortic valve is calcified. There is moderate calcification of the aortic valve. Aortic valve regurgitation is mild. Moderate aortic valve stenosis. Aortic valve area, by VTI measures 1.14 cm. Aortic valve mean gradient measures 25.0 mmHg. Aortic  valve Vmax measures 3.46 m/s. Peak gradient 48 mmHg. DI is 0.33.  6. The inferior vena cava is dilated in size with >50% respiratory variability, suggesting  right atrial pressure of 8 mmHg. Comparison(s): Changes from prior study are noted. 01/29/2020: LVEF 65-70%, moderate AS - mean gradient 27.5 mmHg, DI 0.41. FINDINGS  Left Ventricle: Left ventricular ejection fraction, by estimation, is 70 to 75%. Left ventricular ejection fraction by PLAX is 71 %. The left ventricle has hyperdynamic function. The left ventricle has no regional wall motion abnormalities. Definity contrast agent was given IV to delineate the left ventricular endocardial borders. The left ventricular internal cavity size was normal in size. There is no left ventricular hypertrophy. Left ventricular diastolic function could not be evaluated due to atrial fibrillation. Left ventricular diastolic function could not be evaluated. Right Ventricle: The right ventricular size is normal. No increase in right ventricular wall thickness. Right ventricular systolic function is hyperdynamic. Tricuspid regurgitation signal  is inadequate for assessing PA pressure. Left Atrium: Left atrial size was severely dilated. Right Atrium: Right atrial size was normal in size. Pericardium: There is no evidence of pericardial effusion. Mitral Valve: The mitral valve is abnormal. Moderate mitral annular calcification. Trivial mitral valve regurgitation. Tricuspid Valve: The tricuspid valve is grossly normal. Tricuspid valve regurgitation is trivial. Aortic Valve: The aortic valve is calcified. There is moderate calcification of the aortic valve. Aortic valve regurgitation is mild. Aortic regurgitation PHT measures 617 msec. Moderate aortic stenosis is present. Aortic valve mean gradient measures 25.0 mmHg. Aortic valve peak gradient measures 47.9 mmHg. Aortic valve area, by VTI measures 1.14 cm. Pulmonic Valve: The pulmonic valve was grossly normal. Pulmonic valve regurgitation is trivial. Aorta: The aortic root and ascending aorta are structurally normal, with no evidence of dilitation. Venous: The inferior vena cava is dilated in size with greater than 50% respiratory variability, suggesting right atrial pressure of 8 mmHg. IAS/Shunts: No atrial level shunt detected by color flow Doppler.  LEFT VENTRICLE PLAX 2D LV EF:         Left            Diastology                ventricular     LV e' medial:    6.30 cm/s                ejection        LV E/e' medial:  12.2                fraction by     LV e' lateral:   9.83 cm/s                PLAX is 71      LV E/e' lateral: 7.8                %. LVIDd:         4.70 cm LVIDs:         2.80 cm LV PW:         2.45 cm LV IVS:        1.00 cm LVOT diam:     2.10 cm LV SV:         85 LV SV Index:   44 LVOT Area:     3.46 cm  RIGHT VENTRICLE RV S prime:     19.00 cm/s TAPSE (M-mode): 2.9 cm LEFT ATRIUM              Index        RIGHT ATRIUM           Index LA diam:  4.40 cm  2.27 cm/m   RA Area:     12.90 cm LA Vol (A2C):   147.0 ml 75.77 ml/m  RA Volume:   34.30 ml  17.68 ml/m LA Vol (A4C):   120.0  ml 61.85 ml/m LA Biplane Vol: 137.0 ml 70.62 ml/m  AORTIC VALVE AV Area (Vmax):    1.10 cm AV Area (Vmean):   1.19 cm AV Area (VTI):     1.14 cm AV Vmax:           346.00 cm/s AV Vmean:          228.000 cm/s AV VTI:            0.744 m AV Peak Grad:      47.9 mmHg AV Mean Grad:      25.0 mmHg LVOT Vmax:         110.00 cm/s LVOT Vmean:        78.100 cm/s LVOT VTI:          0.244 m LVOT/AV VTI ratio: 0.33 AI PHT:            617 msec  AORTA Ao Root diam: 3.20 cm Ao Asc diam:  3.10 cm MITRAL VALVE MV Area (PHT): 3.30 cm    SHUNTS MV Decel Time: 230 msec    Systemic VTI:  0.24 m MV E velocity: 77.10 cm/s  Systemic Diam: 2.10 cm MV A velocity: 49.50 cm/s MV E/A ratio:  1.56 Lyman Bishop MD Electronically signed by Lyman Bishop MD Signature Date/Time: 11/11/2021/3:07:06 PM    Final    MR FOOT LEFT WO CONTRAST  Result Date: 11/10/2021 CLINICAL DATA:  Osteomyelitis of the foot suspected. New wound with ischemic changes and order to the left foot. EXAM: MRI OF THE LEFT FOOT WITHOUT CONTRAST TECHNIQUE: Multiplanar, multisequence MR imaging of the left was performed. No intravenous contrast was administered. COMPARISON:  Radiographs dated November 10, 2021. FINDINGS: Bones/Joint/Cartilage Postsurgical changes for prior amputation of the first ray through the mid first metatarsal. There is bone marrow edema in the distal half of the second metatarsal and in the proximal phalanx of the second metatarsal. Superior subluxation of the second metatarsal at the metatarsophalangeal joint. Marrow edema at the base of the proximal phalanx of the third digit. Ligaments Lisfranc ligament appear intact. Evaluation of collateral ligaments is limited due to motion. Muscles and Tendons Increased intramuscular signal of the plantar muscles secondary to diabetic myopathy/myositis. No appreciable fluid collection. Soft tissues There is soft tissue edema and open wound on the plantar aspect of the second digit with surrounding phlegmonous  changes. There is soft tissue edema and likely small amount of free air on the dorsal aspect of the third digit. Soft tissue swelling about the dorsum of the foot. IMPRESSION: 1. Bone marrow edema of the distal half of the second metatarsal and proximal phalanx of the second digit with subluxation at the metatarsophalangeal joint, which in the presence of adjacent deep skin wound phlegmonous changes is consistent with osteomyelitis with septic arthritis. 2. Bone marrow edema of the proximal phalanx of the third digit with surrounding edema and inflammatory changes also concerning for osteomyelitis. 3. Deep skin wound and phlegmonous changes about the plantar aspect of the second digit as well as inflammatory changes about the third proximal phalanx suggesting phlegmonous process. Clinical correlation is suggested, 4.  Additional chronic findings as above. Electronically Signed   By: Keane Police D.O.   On: 11/10/2021 17:59   VAS [place] ABI WITH/WO  TBI  Result Date: 11/10/2021  LOWER EXTREMITY DOPPLER STUDY Patient Name:  KADIN CANIPE  Date of Exam:   11/10/2021 Medical Rec #: [id number]        Accession #:    [id number] Date of Birth: 1933-07-04        Patient Gender: M Patient Age:   36 years Exam Location:  [facility] Procedure:      VAS [place] ABI WITH/WO TBI Referring Phys: CHRISTIAN PROSPERI --------------------------------------------------------------------------------  Indications: Gangrene. right transmetatarsal amputation and Left great and              second toes. High Risk Factors: Hypertension, hyperlipidemia, Diabetes, past history of                    smoking, coronary artery disease.  Comparison Study: 05/17/21 at outside facility RT 0.7, LT 1.03 Performing Technologist: Bobetta Lime BS RVT  Examination Guidelines: A complete evaluation includes at minimum, Doppler waveform signals and systolic blood pressure reading at the level of bilateral brachial, anterior tibial, and posterior tibial  arteries, when vessel segments are accessible. Bilateral testing is considered an integral part of a complete examination. Photoelectric Plethysmograph (PPG) waveforms and toe systolic pressure readings are included as required and additional duplex testing as needed. Limited examinations for reoccurring indications may be performed as noted.  ABI Findings: +---------+------------------+-----+---------+----------+ Right    Rt Pressure (mmHg)IndexWaveform Comment    +---------+------------------+-----+---------+----------+ Brachial 119                    triphasic           +---------+------------------+-----+---------+----------+ PTA      128               1.07 triphasic           +---------+------------------+-----+---------+----------+ DP       118               0.98 triphasic           +---------+------------------+-----+---------+----------+ Great Toe                                Amputation +---------+------------------+-----+---------+----------+ +---------+------------------+-----+----------+----------+ Left     Lt Pressure (mmHg)IndexWaveform  Comment    +---------+------------------+-----+----------+----------+ Brachial 120                    triphasic            +---------+------------------+-----+----------+----------+ PTA      121               1.01 biphasic             +---------+------------------+-----+----------+----------+ DP       92                0.77 monophasic           +---------+------------------+-----+----------+----------+ Great Toe                                 Amputation +---------+------------------+-----+----------+----------+ +-------+-----------+-----------+------------+------------+ ABI/TBIToday's ABIToday's TBIPrevious ABIPrevious TBI +-------+-----------+-----------+------------+------------+ Right  108                   .7                       +-------+-----------+-----------+------------+------------+  Left   1.01  1.03                     +-------+-----------+-----------+------------+------------+ Right ABIs appear increased. Left ABIs appear essentially unchanged. Right transmetatarsal amputation, left great toe and second toe amputation  Summary: Right: Resting right ankle-brachial index is within normal range. Left: Resting left ankle-brachial index indicates noncompressible left lower extremity arteries. *See table(s) above for measurements and observations.  Electronically signed by Jamelle Haring on 11/10/2021 at 5:27:22 PM.    Final    VAS [place] LOWER EXTREMITY ARTERIAL DUPLEX (7a-7p)  Result Date: 11/10/2021 LOWER EXTREMITY ARTERIAL DUPLEX STUDY Patient Name:  AIREN STIEHL  Date of Exam:   11/10/2021 Medical Rec #: [id number]        Accession #:    [id number] Date of Birth: 1933/10/18        Patient Gender: M Patient Age:   67 years Exam Location:  [facility] Procedure:      VAS [place] LOWER EXTREMITY ARTERIAL DUPLEX Referring Phys: CHRISTIAN PROSPERI --------------------------------------------------------------------------------  Indications: Gangrene. High Risk Factors: Hypertension, hyperlipidemia, Diabetes, past history of                    smoking, coronary artery disease.  Current ABI: 1.01 Comparison Study: No previous left LE arterial duplex noted. Performing Technologist: Bobetta Lime BS, RVT  Examination Guidelines: A complete evaluation includes B-mode imaging, spectral Doppler, color Doppler, and power Doppler as needed of all accessible portions of each vessel. Bilateral testing is considered an integral part of a complete examination. Limited examinations for reoccurring indications may be performed as noted.   +----------+--------+-----+--------+----------+--------+ LEFT      PSV cm/sRatioStenosisWaveform  Comments +----------+--------+-----+--------+----------+--------+ CIA Distal                     triphasic           +----------+--------+-----+--------+----------+--------+ CFA Distal243                  triphasic          +----------+--------+-----+--------+----------+--------+ DFA       114                  triphasic          +----------+--------+-----+--------+----------+--------+ SFA Prox  209                  triphasic          +----------+--------+-----+--------+----------+--------+ SFA Mid   128                  triphasic          +----------+--------+-----+--------+----------+--------+ SFA Distal111                  triphasic          +----------+--------+-----+--------+----------+--------+ POP Prox  97                   triphasic          +----------+--------+-----+--------+----------+--------+ POP Distal74                   triphasic          +----------+--------+-----+--------+----------+--------+ TP Trunk  108                  triphasic          +----------+--------+-----+--------+----------+--------+ ATA Prox  96  biphasic           +----------+--------+-----+--------+----------+--------+ ATA Mid   89                   biphasic           +----------+--------+-----+--------+----------+--------+ ATA Distal41                   biphasic           +----------+--------+-----+--------+----------+--------+ PTA Prox  111                  biphasic           +----------+--------+-----+--------+----------+--------+ PTA Mid   193                  biphasic           +----------+--------+-----+--------+----------+--------+ PTA Distal120                  biphasic           +----------+--------+-----+--------+----------+--------+ DP        43                   monophasic         +----------+--------+-----+--------+----------+--------+  Summary: Left: No hemodynamically significant stenoses noted.  See table(s) above for measurements and observations. Electronically signed by Jamelle Haring on 11/10/2021 at 5:27:04  PM.    Final    DG Foot 2 Views Left  Result Date: 11/10/2021 CLINICAL DATA:  88 year old male with concern for osteomyelitis. EXAM: LEFT FOOT - 2 VIEW COMPARISON:  02/02/2020 FINDINGS: Status post amputation of the mid first metatarsal. Dorsal subluxation of the second metatarsophalangeal joint. Subcutaneous emphysema in the dorsal and medial aspect of the second toe. No definite cortical abnormality, however limited evaluation with only two views. Mild plantar calcaneal enthesopathy. IMPRESSION: Soft tissue emphysema about the medial and dorsal aspect of the second toe without definite underlying osseous involvement, however this is a limited study. Consider MRI of the foot for further characterization as clinically indicated. Electronically Signed   By: Ruthann Cancer M.D.   On: 11/10/2021 14:07     Left TMA amputation   Subjective: Patient was seen and examined at bedside.  Overnight events noted.  Patient s/p left TMA,  tolerated well.  Postoperative day 4.  Patient feels better and wants to be discharged.  Patient is discharged on oral antibiotics.  Discharge Exam: Vitals:   11/15/21 1948 11/16/21 0736  BP: (!) 158/56 132/66  Pulse: (!) 52 71  Resp: 16 18  Temp: 97.7 F (36.5 C) 98.1 F (36.7 C)  SpO2: 98% 92%   Vitals:   11/15/21 0754 11/15/21 0905 11/15/21 1948 11/16/21 0736  BP: (!) 143/67  (!) 158/56 132/66  Pulse: 75  (!) 52 71  Resp:   16 18  Temp: 98.7 F (37.1 C)  97.7 F (36.5 C) 98.1 F (36.7 C)  TempSrc: Oral  Oral Oral  SpO2: 93% 96% 98% 92%  Weight:      Height:        General: Pt is alert, awake, not in acute distress Cardiovascular: RRR, S1/S2 +, no rubs, no gallops Respiratory: CTA bilaterally, no wheezing, no rhonchi Abdominal: Soft, NT, ND, bowel sounds + Extremities: Left TMA POD 4, Staples noted,  mild edema noted    The results of significant diagnostics from this hospitalization (including imaging, microbiology, ancillary and laboratory) are  listed below for reference.  Microbiology: Recent Results (from the past 240 hour(s))  MRSA Next Gen by PCR, Nasal     Status: None   Collection Time: 11/12/21  9:08 AM   Specimen: Nasal Mucosa; Nasal Swab  Result Value Ref Range Status   MRSA by PCR Next Gen NOT DETECTED NOT DETECTED Final    Comment: (NOTE) The GeneXpert MRSA Assay (FDA approved for NASAL specimens only), is one component of a comprehensive MRSA colonization surveillance program. It is not intended to diagnose MRSA infection nor to guide or monitor treatment for MRSA infections. Test performance is not FDA approved in patients less than 60 years old. Performed at [facility], [place] [address], [place], [place] [address]      Labs: BNP (last 3 results) No results for input(s): "BNP" in the last 8760 hours. Basic Metabolic Panel: Recent Labs  Lab 11/12/21 0624 11/13/21 0155 11/13/21 0623 11/14/21 0547 11/15/21 0420 11/16/21 0425  NA 134* 132*  --  136 138 137  K 4.1 5.3* 4.7 4.6 4.4 4.1  CL 105 105  --  106 107 110  CO2 21* 19*  --  22 23 19*  GLUCOSE 121* 294*  --  95 116* 95  BUN 21 24*  --  36* 34* 22  CREATININE 2.17* 1.85*  --  1.84* 1.63* 1.32*  CALCIUM 7.9* 7.7*  --  8.0* 8.3* 8.2*  MG 2.2  --   --   --   --   --   PHOS 3.6  --   --   --   --   --    Liver Function Tests: No results for input(s): "AST", "ALT", "ALKPHOS", "BILITOT", "PROT", "ALBUMIN" in the last 168 hours. No results for input(s): "LIPASE", "AMYLASE" in the last 168 hours. No results for input(s): "AMMONIA" in the last 168 hours. CBC: Recent Labs  Lab 11/10/21 2216 11/11/21 0316 11/12/21 0624 11/13/21 0155 11/13/21 2237 11/14/21 0547 11/15/21 0420 11/16/21 0425  WBC 12.1*   < > 14.6* 9.6  --  11.8* 11.4* 10.2  NEUTROABS 8.2*  --   --   --   --   --   --   --   HGB 7.6*   < > 7.7* 7.0* 8.0* 7.8* 9.1* 8.4*  HCT 24.4*   < > 24.0* 22.3* 25.0* 23.8* 29.0* 26.5*  MCV 69.7*   < > 71.6* 71.9*  --  72.3* 74.7* 73.4*   PLT 396   < > 250 199  --  211 240 208   < > = values in this interval not displayed.   Cardiac Enzymes: No results for input(s): "CKTOTAL", "CKMB", "CKMBINDEX", "TROPONINI" in the last 168 hours. BNP: Invalid input(s): "POCBNP" CBG: Recent Labs  Lab 11/15/21 1208 11/15/21 1650 11/15/21 2109 11/16/21 0819 11/16/21 1155  GLUCAP 136* 174* 179* 91 152*   D-Dimer No results for input(s): "DDIMER" in the last 72 hours. Hgb A1c No results for input(s): "HGBA1C" in the last 72 hours. Lipid Profile No results for input(s): "CHOL", "HDL", "LDLCALC", "TRIG", "CHOLHDL", "LDLDIRECT" in the last 72 hours. Thyroid function studies No results for input(s): "TSH", "T4TOTAL", "T3FREE", "THYROIDAB" in the last 72 hours.  Invalid input(s): "FREET3" Anemia work up No results for input(s): "VITAMINB12", "FOLATE", "FERRITIN", "TIBC", "IRON", "RETICCTPCT" in the last 72 hours. Urinalysis    Component Value Date/Time   COLORURINE YELLOW 11/10/2021 2250   APPEARANCEUR CLEAR 11/10/2021 2250   LABSPEC 1.010 11/10/2021 2250   PHURINE 7.0 11/10/2021 2250  GLUCOSEU NEGATIVE 11/10/2021 2250   HGBUR NEGATIVE 11/10/2021 2250   BILIRUBINUR NEGATIVE 11/10/2021 2250   KETONESUR NEGATIVE 11/10/2021 2250   PROTEINUR NEGATIVE 11/10/2021 2250   NITRITE NEGATIVE 11/10/2021 2250   LEUKOCYTESUR NEGATIVE 11/10/2021 2250   Sepsis Labs Recent Labs  Lab 11/13/21 0155 11/14/21 0547 11/15/21 0420 11/16/21 0425  WBC 9.6 11.8* 11.4* 10.2   Microbiology Recent Results (from the past 240 hour(s))  MRSA Next Gen by PCR, Nasal     Status: None   Collection Time: 11/12/21  9:08 AM   Specimen: Nasal Mucosa; Nasal Swab  Result Value Ref Range Status   MRSA by PCR Next Gen NOT DETECTED NOT DETECTED Final    Comment: (NOTE) The GeneXpert MRSA Assay (FDA approved for NASAL specimens only), is one component of a comprehensive MRSA colonization surveillance program. It is not intended to diagnose MRSA infection  nor to guide or monitor treatment for MRSA infections. Test performance is not FDA approved in patients less than 73 years old. Performed at [facility], [place] [address] [name], [place] [address]      Time coordinating discharge: Over 30 minutes  SIGNED:   Shawna Clamp, MD  [facility] 11/16/2021, 1:22 PM Pager   If 7PM-7AM, please contact night-coverage

## 2021-11-16 NOTE — Progress Notes (Signed)
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 PMH:DM2, PAD, CAD, A.Fib on eliquis.    PT Comments    Pt received up ad lib with OT present entering hallway from his room, agreeable to therapy session and with good participation and tolerance for gait progression with RW support and safety cues.  Pt making good progress toward goals, with improved standing tolerance but noted bloody drainage from LLE incision into post-op shoe limiting gait distance (for pt safety due to fall risk from slippery sock). RN notified and entering room to re-dress incision at end of session, discussed LLE elevation and use of ice PRN 10-20 mins on/off during the day for reducing pain/inflammation. Pt continues to benefit from PT services to progress toward functional mobility goals and pt/spouse agreeable to HHPT.  Recommendations for follow up therapy are one component of a multi-disciplinary discharge planning process, led by the attending physician.  Recommendations may be updated based on patient status, additional functional criteria and insurance authorization.  Follow Up Recommendations  Home health PT     Assistance Recommended at Discharge Frequent or constant Supervision/Assistance  Patient can return home with the following A lot of help with walking and/or transfers;A little help with bathing/dressing/bathroom;Assistance with cooking/housework;Assist for transportation;Help with stairs or ramp for entrance   Equipment Recommendations  None recommended by PT (3:1 in room, no new needs)    Recommendations for Other Services       Precautions / Restrictions Precautions Precaution Comments: L TMA incision drainage/bleeding Restrictions Weight Bearing Restrictions: Yes LLE Weight Bearing:  Weight bearing as tolerated Other Position/Activity Restrictions: now WBAT per vascular surgery note 8/20     Mobility  Bed Mobility Overal bed mobility: Needs Assistance Bed Mobility: Sit to Supine       Sit to supine: Min assist   General bed mobility comments: to LLE due to increased drainage and pillow placed under extremity for edema relief    Transfers Overall transfer level: Needs assistance Equipment used: Rolling walker (2 wheels) Transfers: Sit to/from Stand, Bed to chair/wheelchair/BSC Sit to Stand: Min assist           General transfer comment: cues for safer hand placement (to reach back to surface rather than plopping down) with reinforcement given post-transfer as pt ignoring cues to reach back    Ambulation/Gait Ambulation/Gait assistance: Min assist, Min guard Gait Distance (Feet): 50 Feet Assistive device: Rolling walker (2 wheels) Gait Pattern/deviations: Step-to pattern, Step-through pattern, Antalgic       General Gait Details: no c/o fatigue but distance limited by PTA due to increased bloody drainage to L foot with gait progression and pt needing to have incision site re-dressed by RN. fair use of RW, cues for closer proximity to device and upright posture; no significant LOB, min guard to [employer] Mobility    Modified Rankin (Stroke Patients Only)       Balance Overall balance assessment: Needs assistance Sitting-balance support: Feet supported, No upper extremity supported, Single extremity supported, Bilateral upper extremity supported Sitting balance-Leahy Scale: Fair     Standing balance support: During functional activity, Reliant on assistive device for balance Standing balance-Leahy Scale: Poor Standing balance comment: reliant on external support  Cognition Arousal/Alertness: Awake/alert Behavior During Therapy: WFL for tasks assessed/performed Overall  Cognitive Status: Within Functional Limits for tasks assessed                                          Exercises Other Exercises Other Exercises: verbal review for LE exercises, pt RN entering room to re-dress incision which was draining so defer to perform but pt has HHPT ordered and will continue to reinforce.    General Comments General comments (skin integrity, edema, etc.): VSS on RA per chart review, no acute s/sx distress with mobility      Pertinent Vitals/Pain Pain Assessment Pain Assessment: Faces Faces Pain Scale: Hurts a little bit Pain Location: L foot surgical site Pain Descriptors / Indicators: Sore Pain Intervention(s): Monitored during session, Repositioned, Ice applied (LLE elevated post-ambulation due to some drainage)           PT Goals (current goals can now be found in the care plan section) Acute Rehab PT Goals Patient Stated Goal: to walk PT Goal Formulation: With patient/family Time For Goal Achievement: 12/04/21 Progress towards PT goals: Progressing toward goals    Frequency    Min 3X/week      PT Plan Current plan remains appropriate       AM-PAC PT "6 Clicks" Mobility   Outcome Measure  Help needed turning from your back to your side while in a flat bed without using bedrails?: None Help needed moving from lying on your back to sitting on the side of a flat bed without using bedrails?: A Little Help needed moving to and from a bed to a chair (including a wheelchair)?: A Little Help needed standing up from a chair using your arms (e.g., wheelchair or bedside chair)?: A Lot (modA with OT) Help needed to walk in hospital room?: A Little Help needed climbing 3-5 steps with a railing? : A Lot 6 Click Score: 17    End of Session Equipment Utilized During Treatment: Gait belt Activity Tolerance: Patient tolerated treatment well;Other (comment) (drainage from incision limiting gait distance) Patient left: in bed;with call  bell/phone within reach;with family/visitor present;Other (comment);with nursing/sitter in room (LLE elevated, RN entering room to place new dresssing due to drainage) Nurse Communication: Mobility status;Other (comment) (LLE drainage/bleeding) PT Visit Diagnosis: Unsteadiness on feet (R26.81);Other abnormalities of gait and mobility (R26.89);Muscle weakness (generalized) (M62.81);Difficulty in walking, not elsewhere classified (R26.2);Pain Pain - Right/Left: Left Pain - part of body: Ankle and joints of foot     Time: 5597-4163 PT Time Calculation (min) (ACUTE ONLY): 18 min  Charges:  $Gait Training: 8-22 mins                     Ghalia Reicks P., PTA Acute Rehabilitation Services Secure Chat Preferred 9a-5:30pm Office: [place] 11/16/2021, 1:23 PM

## 2021-11-16 NOTE — Progress Notes (Signed)
Occupational Therapy Treatment Patient Details Name: Mike Wright MRN: [id number] DOB: [birth year] Today's Date: 11/16/2021   History of present illness 88 year old male admitted 8/16 with gangrenous changes of the left foot s/p 8/18 L transmetatarsal amputation. PMH:DM2, PAD, CAD, A.Fib on eliquis   OT comments  Pt progressing towards goals, pt able to don post op shoe and sock on LLE, needing min A to don on RLE due to fair sitting balance. Pt able to ambulate short distance, with handoff to PT in hall, noted pt's L foot bleeding and returning back to room with PT assist. Pt presenting with impairments listed below, will follow acutely. Continue to recommend HHOT at d/c.   Recommendations for follow up therapy are one component of a multi-disciplinary discharge planning process, led by the attending physician.  Recommendations may be updated based on patient status, additional functional criteria and insurance authorization.    Follow Up Recommendations  Home health OT    Assistance Recommended at Discharge Intermittent Supervision/Assistance  Patient can return home with the following  A lot of help with walking and/or transfers;A little help with bathing/dressing/bathroom;Assistance with cooking/housework;Assist for transportation;Direct supervision/assist for financial management;Direct supervision/assist for medications management;Help with stairs or ramp for entrance   Equipment Recommendations  BSC/3in1    Recommendations for Other Services PT consult    Precautions / Restrictions Restrictions Weight Bearing Restrictions: Yes LLE Weight Bearing: Weight bearing as tolerated Other Position/Activity Restrictions: now WBAT per vascular surgery note 8/20       Mobility Bed Mobility Overal bed mobility: Needs Assistance Bed Mobility: Supine to Sit     Supine to sit: Min guard     General bed mobility comments: increased time    Transfers Overall transfer level: Needs  assistance Equipment used: Rolling walker (2 wheels) Transfers: Sit to/from Stand, Bed to chair/wheelchair/BSC Sit to Stand: Mod assist                 Balance Overall balance assessment: Needs assistance Sitting-balance support: Feet supported, No upper extremity supported, Single extremity supported, Bilateral upper extremity supported Sitting balance-Leahy Scale: Fair     Standing balance support: During functional activity, Reliant on assistive device for balance Standing balance-Leahy Scale: Poor Standing balance comment: reliant on external support                           ADL either performed or assessed with clinical judgement   ADL Overall ADL's : Needs assistance/impaired                     Lower Body Dressing: Minimal assistance Lower Body Dressing Details (indicate cue type and reason): can don L sock and post op shoe, min A to don R sock due to impaired sitting balance             Functional mobility during ADLs: Min guard;Rolling walker (2 wheels)      Extremity/Trunk Assessment Upper Extremity Assessment Upper Extremity Assessment: Generalized weakness   Lower Extremity Assessment Lower Extremity Assessment: Defer to PT evaluation        Vision   Vision Assessment?: No apparent visual deficits   Perception Perception Perception: Not tested   Praxis Praxis Praxis: Not tested    Cognition Arousal/Alertness: Awake/alert Behavior During Therapy: WFL for tasks assessed/performed Overall Cognitive Status: Within Functional Limits for tasks assessed  Exercises      Shoulder Instructions       General Comments VSS on RA, spouse present during session    Pertinent Vitals/ Pain       Pain Assessment Pain Assessment: No/denies pain Pain Intervention(s): Limited activity within patient's tolerance, Monitored during session  Home Living                                           Prior Functioning/Environment              Frequency  Min 2X/week        Progress Toward Goals  OT Goals(current goals can now be found in the care plan section)  Progress towards OT goals: Progressing toward goals  Acute Rehab OT Goals Patient Stated Goal: none stated OT Goal Formulation: With patient Time For Goal Achievement: 11/29/21 Potential to Achieve Goals: Good ADL Goals Pt Will Perform Upper Body Dressing: Independently;sitting;standing Pt Will Perform Lower Body Dressing: with min guard assist;sit to/from stand;sitting/lateral leans Pt Will Transfer to Toilet: with supervision;ambulating;regular height toilet;bedside commode Pt Will Perform Tub/Shower Transfer: Tub transfer;Shower transfer;ambulating;tub bench;with min guard assist Additional ADL Goal #1: pt will complete bed mobility independently in prep for ADL  Plan      Co-evaluation                 AM-PAC OT "6 Clicks" Daily Activity     Outcome Measure   Help from another person eating meals?: None Help from another person taking care of personal grooming?: None Help from another person toileting, which includes using toliet, bedpan, or urinal?: A Lot Help from another person bathing (including washing, rinsing, drying)?: A Lot Help from another person to put on and taking off regular upper body clothing?: A Little Help from another person to put on and taking off regular lower body clothing?: A Little 6 Click Score: 18    End of Session Equipment Utilized During Treatment: Gait belt;Rolling walker (2 wheels)  OT Visit Diagnosis: Unsteadiness on feet (R26.81);Other abnormalities of gait and mobility (R26.89);Muscle weakness (generalized) (M62.81)   Activity Tolerance Patient tolerated treatment well   Patient Left in bed;with call bell/phone within reach;with bed alarm set   Nurse Communication Mobility status (pt's foot bleeding with mobility)         Time: 4193-7902 OT Time Calculation (min): 13 min  Charges: OT General Charges $OT Visit: 1 Visit OT Treatments $Self Care/Home Management : 8-22 mins  Lynnda Child, OTD, OTR/L Acute Rehab [phone]   Kaylyn Lim 11/16/2021, 12:20 PM

## 2021-11-16 NOTE — Progress Notes (Signed)
  Progress Note    11/16/2021 8:24 AM 4 Days Post-Op  Subjective:  Patient said he is ready to go home. Denies L foot pain  Vitals:   11/15/21 1948 11/16/21 0736  BP: (!) 158/56 132/66  Pulse: (!) 52 71  Resp: 16 18  Temp: 97.7 F (36.5 C) 98.1 F (36.7 C)  SpO2: 98% 92%    Physical Exam: Lungs:  nonlabored Incisions:  L dorsum residual erythema. L TMA incision intact with staples, currently no discharge on exam   CBC    Component Value Date/Time   WBC 10.2 11/16/2021 0425   RBC 3.61 (L) 11/16/2021 0425   HGB 8.4 (L) 11/16/2021 0425   HGB 12.3 (L) 05/30/2017 1531   HCT 26.5 (L) 11/16/2021 0425   HCT 37.7 05/30/2017 1531   PLT 208 11/16/2021 0425   PLT 212 05/30/2017 1531   MCV 73.4 (L) 11/16/2021 0425   MCV 86 05/30/2017 1531   MCH 23.3 (L) 11/16/2021 0425   MCHC 31.7 11/16/2021 0425   RDW 23.1 (H) 11/16/2021 0425   RDW 16.0 (H) 05/30/2017 1531   LYMPHSABS 1.1 11/10/2021 2216   MONOABS 2.2 (H) 11/10/2021 2216   EOSABS 0.1 11/10/2021 2216   BASOSABS 0.1 11/10/2021 2216    BMET    Component Value Date/Time   NA 137 11/16/2021 0425   NA 139 05/30/2017 1531   K 4.1 11/16/2021 0425   CL 110 11/16/2021 0425   CO2 19 (L) 11/16/2021 0425   GLUCOSE 95 11/16/2021 0425   BUN 22 11/16/2021 0425   BUN 13 05/30/2017 1531   CREATININE 1.32 (H) 11/16/2021 0425   CREATININE 1.00 01/04/2018 1401   CALCIUM 8.2 (L) 11/16/2021 0425   GFRNONAA 53 (L) 11/16/2021 0425   GFRNONAA >60 01/04/2018 1401   GFRAA >60 12/04/2019 1416   GFRAA >60 01/04/2018 1401    INR    Component Value Date/Time   INR 1.3 (H) 09/26/2019 0826     Intake/Output Summary (Last 24 hours) at 11/16/2021 0824 Last data filed at 11/16/2021 0600 Gross per 24 hour  Intake 240 ml  Output 2500 ml  Net -2260 ml     Assessment/Plan:  88 y.o. male is 4 days post op, s/p: L TMA   -Stable for d/c from vascular standpoint -Will arrange for follow up with our office in 2-3 weeks for wound  check -Transition to oral ABX today for 6 weeks  Vicente Serene, PA-C [facility] [phone] 11/16/2021 8:24 AM

## 2021-11-16 NOTE — TOC Transition Note (Signed)
Transition of Care [facility]) - CM/SW Discharge Note   Patient Details  Name: Mike Wright MRN: [id number] Date of Birth: 11-05-1933  Transition of Care [facility]) CM/SW Contact:  Curlene Labrum, RN Phone Number: 11/16/2021, 11:34 AM   Clinical Narrative:    Cm met with the patient and wife at the bedside to discuss transitions of care to home today.  The patient will be discharging home with the patient's wife and a neighbor will be providing transportation home by car today.  3:1 equipment is present in the room.  The patient has active orders for home health through [facility].  Discharge medications will be provided through [facility] prior to discharge to home.   Final next level of care: [place] Barriers to Discharge: Continued Medical Work up   Patient Goals and CMS Choice Patient states their goals for this hospitalization and ongoing recovery are:: To return home CMS Medicare.gov Compare Post Acute Care list provided to:: Patient Choice offered to / list presented to : Patient  Discharge Placement                       Discharge Plan and Services   Discharge Planning Services: CM Consult Post Acute Care Choice: Durable Medical Equipment, Home Health          DME Arranged: 3-N-1 DME Agency: [employer] Date DME Agency Contacted: 11/15/21 Time DME Agency Contacted: 1053 Representative spoke with at DME Agency: Jeanella Anton, [place] with [employer] HH Arranged: PT, OT [place] Agency: [place] Date [place]: 11/15/21 Time [place]: 51 Representative spoke with at [place]: Jeanella Anton, [place] with [employer]  Social Determinants of Health ([place]) Interventions     Readmission Risk Interventions    11/15/2021   10:56 AM 12/18/2020    2:00 PM 02/07/2020   12:11 PM  Readmission Risk Prevention Plan  Transportation Screening Complete Complete Complete  PCP or Specialist Appt within 5-7 Days  Complete   PCP or Specialist Appt  within 3-5 Days Complete  Complete  Home Care Screening  Complete   Medication Review (RN CM)  Complete   HRI or Home Care Consult Complete  Complete  Social Work Consult for [place] Planning/Counseling Complete  Complete  Palliative Care Screening Complete  Not Applicable  Medication Review Press photographer) Complete  Complete

## 2021-11-16 NOTE — Progress Notes (Signed)
Orthopedic Tech Progress Note Patient Details:  Mike Wright Aug 05, 1933 [id number]  Ortho Devices Type of Ortho Device: Postop shoe/boot Ortho Device/Splint Location: lle Ortho Device/Splint Interventions: Ordered, Application, Adjustment  I fit a shoe for the lle. The rle foot wouldn't hold a shoe. Post Interventions Patient Tolerated: Well Instructions Provided: Adjustment of device, Care of device  [name] 11/16/2021, 6:43 AM

## 2021-11-17 ENCOUNTER — Telehealth: Payer: Self-pay | Admitting: Physician Assistant

## 2021-11-17 NOTE — Telephone Encounter (Signed)
-----   Message from [facility], [place] sent at 11/16/2021  8:28 AM EDT ----- S/p L TMA 8/18  Follow up with PA in 2-3 weeks for wound check, on [place] office day

## 2021-11-18 ENCOUNTER — Telehealth: Payer: Self-pay | Admitting: Cardiology

## 2021-11-18 ENCOUNTER — Telehealth: Payer: Self-pay

## 2021-11-18 DIAGNOSIS — E1122 Type 2 diabetes mellitus with diabetic chronic kidney disease: Secondary | ICD-10-CM | POA: Diagnosis not present

## 2021-11-18 DIAGNOSIS — J449 Chronic obstructive pulmonary disease, unspecified: Secondary | ICD-10-CM | POA: Diagnosis not present

## 2021-11-18 DIAGNOSIS — Z4781 Encounter for orthopedic aftercare following surgical amputation: Secondary | ICD-10-CM | POA: Diagnosis not present

## 2021-11-18 DIAGNOSIS — Z89432 Acquired absence of left foot: Secondary | ICD-10-CM | POA: Diagnosis not present

## 2021-11-18 DIAGNOSIS — I2510 Atherosclerotic heart disease of native coronary artery without angina pectoris: Secondary | ICD-10-CM | POA: Diagnosis not present

## 2021-11-18 DIAGNOSIS — N1832 Chronic kidney disease, stage 3b: Secondary | ICD-10-CM | POA: Diagnosis not present

## 2021-11-18 DIAGNOSIS — I130 Hypertensive heart and chronic kidney disease with heart failure and stage 1 through stage 4 chronic kidney disease, or unspecified chronic kidney disease: Secondary | ICD-10-CM | POA: Diagnosis not present

## 2021-11-18 DIAGNOSIS — E1152 Type 2 diabetes mellitus with diabetic peripheral angiopathy with gangrene: Secondary | ICD-10-CM | POA: Diagnosis not present

## 2021-11-18 DIAGNOSIS — I5032 Chronic diastolic (congestive) heart failure: Secondary | ICD-10-CM | POA: Diagnosis not present

## 2021-11-18 NOTE — Telephone Encounter (Signed)
Tressia Danas, PT with [facility] called stating that they wanted to initiate a RN for weekly visits following his recent TMA. Ok'd and asked for paperwork to be faxed. Confirmed understanding.

## 2021-11-18 NOTE — Telephone Encounter (Signed)
Per last OV on 10/26/21 - note states - - we had continued prolonged eliquis and plavix due to stent burden.  11/16/21 - last hospital visit - the following meds were stopped due to AKI -   STOP taking these medications     furosemide 80 MG tablet Commonly known as: LASIX    lisinopril 5 MG tablet Commonly known as: ZESTRIL    metFORMIN 1000 MG tablet Commonly known as: GLUCOPHAGE    Will send to provider to clarify.

## 2021-11-18 NOTE — Telephone Encounter (Signed)
Pt c/o medication issue:  1. Name of Medication:   clopidogrel (PLAVIX) 75 MG tablet  apixaban (ELIQUIS) 2.5 MG TABS tablet Furosemide  2. How are you currently taking this medication (dosage and times per day)?   3. Are you having a reaction (difficulty breathing--STAT)?   4. What is your medication issue? Ben , home health physical therapist, called stating that pt is on plavix and eliquis and it has a level 2 interaction   He also stated that pt was seen in the hospital for gangrene and they told him to stop taking his furosemide (lasix).

## 2021-11-19 NOTE — Telephone Encounter (Signed)
Continue eliquis and plavix given extensive cardiac stenting history. Correct we do not often do this but in his care it is warranted. Ok to be lasix  Zandra Abts MD

## 2021-11-20 LAB — GLUCOSE, CAPILLARY
Glucose-Capillary: 125 mg/dL — ABNORMAL HIGH (ref 70–99)
Glucose-Capillary: 113 mg/dL — ABNORMAL HIGH (ref 70–99)

## 2021-11-22 ENCOUNTER — Telehealth: Payer: Self-pay | Admitting: Cardiology

## 2021-11-22 NOTE — Telephone Encounter (Signed)
Patient states at discharge from the hospital they told him to stop taking his lisinopril and furoesemide.  He was called to make sure that is it okay to stop them.  He wanted to make sure Dr. [name] is aware they stopped the medications.

## 2021-11-22 NOTE — Telephone Encounter (Signed)
Left message to return call 

## 2021-11-22 NOTE — Telephone Encounter (Signed)
Mike Wright with PT notified & verbalized understanding.

## 2021-11-22 NOTE — Telephone Encounter (Signed)
Informed patient that Dr. [name] is aware and okay with you being off of those medications.  Patient wants to know if you will prescribe him anything to replace the Lasix.  States that he does have some weight gain occasionally and would like to have something on hand if possible.

## 2021-11-24 DIAGNOSIS — I2510 Atherosclerotic heart disease of native coronary artery without angina pectoris: Secondary | ICD-10-CM | POA: Diagnosis not present

## 2021-11-24 DIAGNOSIS — E1152 Type 2 diabetes mellitus with diabetic peripheral angiopathy with gangrene: Secondary | ICD-10-CM | POA: Diagnosis not present

## 2021-11-24 DIAGNOSIS — J449 Chronic obstructive pulmonary disease, unspecified: Secondary | ICD-10-CM | POA: Diagnosis not present

## 2021-11-24 DIAGNOSIS — Z4781 Encounter for orthopedic aftercare following surgical amputation: Secondary | ICD-10-CM | POA: Diagnosis not present

## 2021-11-25 DIAGNOSIS — I10 Essential (primary) hypertension: Secondary | ICD-10-CM | POA: Diagnosis not present

## 2021-11-25 DIAGNOSIS — E7800 Pure hypercholesterolemia, unspecified: Secondary | ICD-10-CM | POA: Diagnosis not present

## 2021-11-25 MED ORDER — FUROSEMIDE 40 MG PO TABS: 40.0000 mg | ORAL_TABLET | ORAL | 0 refills | Status: DC | PRN

## 2021-11-25 NOTE — Telephone Encounter (Signed)
Patient notified and verbalized understanding.  New prescription sent to [facility] at patient's request.

## 2021-11-25 NOTE — Telephone Encounter (Signed)
Could use lasix '40mg'$  prn swelling. Would [place] sparingly given the kidney injury he had during recent admission, though kidneys were much improved at discharge always risk of recurrence   Zandra Abts MD

## 2021-11-26 ENCOUNTER — Other Ambulatory Visit: Payer: Self-pay | Admitting: *Deleted

## 2021-11-26 MED ORDER — FUROSEMIDE 40 MG PO TABS: 40.0000 mg | ORAL_TABLET | Freq: Every day | ORAL | 0 refills | Status: DC | PRN

## 2021-12-03 DIAGNOSIS — J9611 Chronic respiratory failure with hypoxia: Secondary | ICD-10-CM | POA: Diagnosis not present

## 2021-12-03 DIAGNOSIS — Z23 Encounter for immunization: Secondary | ICD-10-CM | POA: Diagnosis not present

## 2021-12-03 DIAGNOSIS — Z299 Encounter for prophylactic measures, unspecified: Secondary | ICD-10-CM | POA: Diagnosis not present

## 2021-12-03 DIAGNOSIS — F419 Anxiety disorder, unspecified: Secondary | ICD-10-CM | POA: Diagnosis not present

## 2021-12-03 DIAGNOSIS — I10 Essential (primary) hypertension: Secondary | ICD-10-CM | POA: Diagnosis not present

## 2021-12-03 DIAGNOSIS — E1165 Type 2 diabetes mellitus with hyperglycemia: Secondary | ICD-10-CM | POA: Diagnosis not present

## 2021-12-06 ENCOUNTER — Ambulatory Visit (INDEPENDENT_AMBULATORY_CARE_PROVIDER_SITE_OTHER): Payer: Medicare HMO | Admitting: Physician Assistant

## 2021-12-06 VITALS — BP 127/62 | HR 65 | Temp 97.2°F | Resp 16 | Ht 67.0 in | Wt 178.0 lb

## 2021-12-06 DIAGNOSIS — T8189XD Other complications of procedures, not elsewhere classified, subsequent encounter: Secondary | ICD-10-CM

## 2021-12-06 NOTE — Progress Notes (Signed)
POST OPERATIVE OFFICE NOTE    CC:  F/u for surgery  HPI:  This is a 88 y.o. male who is s/p left TMA by [name] on 11/12/2021.  Surgical history significant for tibial angioplasty in 2019 and then TMA following multiple toe amputations on the right side.  He has also had a left great toe amputation who presented to the emergency department with tissue loss of the left second and third toe.  He was also started on 6 weeks of Bactrim.  He is ambulating with a postop shoe.  He denies fevers, chills, nausea/vomiting.  Allergies  Allergen Reactions   Codeine Shortness Of Breath   Doxycycline Swelling    Swelling and numbness in lips and face. Swelling improved after stopping. Reports still experiences numbness in bottom lip.    Feraheme [Ferumoxytol] Other (See Comments)    Diaphoretic, chest pain   Heparin Other (See Comments)    +HIT,  Severe bleeding (with heparin drip & large doses), tolerates low doses   Iron Shortness Of Breath   Losartan Swelling   Oxycodone Other (See Comments)    "Made me act out of my mind" Mental status changes- hallucinations   Latex Rash    Current Outpatient Medications  Medication Sig Dispense Refill   Accu-Chek Softclix Lancets lancets      acetaminophen (TYLENOL) 500 MG tablet Take 1 tablet (500 mg total) by mouth every 6 (six) hours as needed for mild pain or headache. (Patient taking differently: Take 1,000 mg by mouth every 6 (six) hours as needed for mild pain or headache.) 30 tablet 0   Alcohol Swabs (B-D SINGLE USE SWABS REGULAR) PADS      apixaban (ELIQUIS) 2.5 MG TABS tablet Take 1 tablet (2.5 mg total) by mouth 2 (two) times daily. (Patient taking differently: Take 5 mg by mouth 2 (two) times daily.) 60 tablet 11   atorvastatin (LIPITOR) 80 MG tablet Take 80 mg by mouth every evening.      Blood Glucose Monitoring Suppl (ACCU-CHEK GUIDE) w/Device KIT      Cholecalciferol (VITAMIN D3) 50 MCG (2000 UT) TABS Take 2,000 Units by mouth daily.      clopidogrel (PLAVIX) 75 MG tablet Take 1 tablet (75 mg total) by mouth daily with breakfast. 30 tablet 2   Cyanocobalamin (B-12) 2500 MCG TABS Take 2,500 mcg by mouth daily.     DROPLET INSULIN SYRINGE 31G X 5/16" 1 ML MISC      folic acid (FOLVITE) 1 MG tablet Take 1 tablet (1 mg total) by mouth daily. 30 tablet 1   furosemide (LASIX) 40 MG tablet Take 1 tablet (40 mg total) by mouth daily as needed for edema (swelling). 90 tablet 0   insulin glargine (LANTUS) 100 UNIT/ML injection Inject 30 Units into the skin 2 (two) times daily.     ipratropium-albuterol (DUONEB) 0.5-2.5 (3) MG/3ML SOLN Take 3 mLs by nebulization every other day.     isosorbide mononitrate (IMDUR) 60 MG 24 hr tablet Take 1 tablet (60 mg total) by mouth 2 (two) times daily. 180 tablet 3   nitroGLYCERIN (NITROSTAT) 0.4 MG SL tablet PLACE 1 TABLET (0.4 MG TOTAL) UNDER THE TONGUE EVERY 5  MINUTES AS NEEDED FOR CHEST PAIN. (Patient taking differently: Place 0.4 mg under the tongue every 5 (five) minutes as needed for chest pain.) 25 tablet 3   nystatin (MYCOSTATIN) 100000 UNIT/ML suspension Take by mouth.     OXYGEN Inhale 2 L/min into the lungs See admin instructions.  2 L/min of oxygen at bedtime and during the day as needed for shortness of breath     pantoprazole (PROTONIX) 40 MG tablet Take 1 tablet (40 mg total) by mouth 2 (two) times daily. To protect stomach while taking multiple blood thinners. (Patient taking differently: Take 40 mg by mouth daily. To protect stomach while taking multiple blood thinners.) 60 tablet 2   potassium chloride SA (K-DUR) 20 MEQ tablet Take 1 tablet (20 mEq total) by mouth daily. 30 tablet 1   SPIRIVA HANDIHALER 18 MCG inhalation capsule Place 1 capsule into inhaler and inhale daily after breakfast.      sulfamethoxazole-trimethoprim (BACTRIM DS) 800-160 MG tablet Take 1 tablet by mouth 2 (two) times daily. 84 tablet 0   traMADol (ULTRAM) 50 MG tablet Take 1 tablet (50 mg total) by mouth every 6  (six) hours as needed. 20 tablet 0   traZODone (DESYREL) 100 MG tablet Take 150 mg by mouth at bedtime.     TRUE METRIX BLOOD GLUCOSE TEST test strip      No current facility-administered medications for this visit.     ROS:  See HPI  Physical Exam:  Vitals:   12/06/21 1027  BP: 127/62  Pulse: 65  Resp: 16  Temp: (!) 97.2 F (36.2 C)  TempSrc: Temporal  SpO2: 99%  Weight: 178 lb (80.7 kg)  Height: 5' 7"  (1.702 m)    Incision: Left TMA incision with central dehiscence and erythema of the dorsal foot Extremities: Left DP and PT brisk by Doppler Neuro: A&O   Assessment/Plan:  This is a 88 y.o. male who is s/p: Left transmetatarsal amputation  - Despite brisk DP and PT Doppler flow, the left TMA has central wound dehiscence and erythema of the dorsal foot.   Dr. Trula Slade was also involved in the evaluation and management of this patient today.  Plan will be to start washing the wound twice daily with soap and water and pat to dry.  He will continue Bactrim for a total of 6 weeks.  Continue heel weightbearing only.  He will follow up for recheck in 2 weeks at which time the remaining staples can likely be removed.   Dagoberto Ligas, PA-C [facility] [phone]  Clinic MD:  Trula Slade

## 2021-12-14 ENCOUNTER — Telehealth: Payer: Self-pay

## 2021-12-14 NOTE — Telephone Encounter (Signed)
Mike Wright, with [name] called to let us know pt slipped last night getting out of top and part of his incision opened up. It bled a little and they put a dressing on it which has no blood going through it since last night. Pt's [place] nurse will be out in the next day or two and he is scheduled for a return f/u here next Monday.Pt is aware to call us if he develops a fever, bleeding, redness or swelling or drainage from the incisional area.

## 2021-12-17 NOTE — Progress Notes (Unsigned)
Office Note     CC:  follow up Requesting Provider:  Monico Blitz, MD  HPI: Mike Wright is a 88 y.o. ([birth year]) male who presents s/p left TMA by [name] on 11/12/2021.  Has history of right TMA as well in November of 2021. He has been ambulating with a postop shoe. He was last seen on 12/06/21 at which time he had brisk DP and PT Doppler flow, however the left TMA had central wound dehiscence and erythema of the dorsal foot.  The patient was instructed to wash the wound twice daily with soap and water and pat to dry.  He was continued on Bactrim for a total of 6 weeks. Advised heel weightbearing only and close follow up in 2 weeks for wound check  He presents today for that follow up. He reports very minimal discomfort. Occasionally takes tylenol. His wife helps him with wound care. Washes incision with mild soap and water daily. He is doing some ambulation with rolling walker/ cane. Doing well with [facility] PT  Past Medical History:  Diagnosis Date   Anemia    a. mild, noted 04/2017.   CAD in native artery    a. [name] 04/2017 s/p DES to D1, DES to prox-mid LAD, DES to prox LAD overlapping the prior stent, LVEF 55-65%.    Chronic diastolic CHF (congestive heart failure) (HCC)    Constipation    COPD (chronic obstructive pulmonary disease) (HCC)    Diabetic ulcer of toe (HCC)    DJD (degenerative joint disease) of cervical spine    Dysrhythmia    AFib   Essential hypertension    GERD (gastroesophageal reflux disease)    History of hiatal hernia    HIT (heparin-induced thrombocytopenia) ([place])    Hypothyroidism    Hypoxia    a. went home on home O2 04/2017.   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   Retinitis    Sinus bradycardia    a. HR 30s-40s in 04/2017 -> diltiazem stopped, metoprolol  reduced.   Sleep apnea    "chose not to order CPAP at this time" (05/18/2017)   Type 2 diabetes mellitus ([place])    Wears glasses    Wheezing    a. suspected COPD 04/2017. Former tobacco x 40 years.    Past Surgical History:  Procedure Laterality Date   ABDOMINAL AORTOGRAM W/LOWER EXTREMITY N/A 09/15/2017   Procedure: ABDOMINAL AORTOGRAM W/LOWER EXTREMITY;  Surgeon: Serafina Mitchell, MD;  Location: [place] [facility];  Service: Cardiovascular;  Laterality: N/A;   ABDOMINAL AORTOGRAM W/LOWER EXTREMITY N/A 02/04/2020   Procedure: ABDOMINAL AORTOGRAM W/LOWER EXTREMITY;  Surgeon: Serafina Mitchell, MD;  Location: [place] [facility];  Service: Cardiovascular;  Laterality: N/A;   ABDOMINAL AORTOGRAM W/LOWER EXTREMITY N/A 12/15/2020   Procedure: ABDOMINAL AORTOGRAM W/LOWER EXTREMITY;  Surgeon: Serafina Mitchell, MD;  Location: [place] [facility];  Service: Cardiovascular;  Laterality: N/A;   AMPUTATION Bilateral 09/20/2017   Procedure: BILATERAL GREAT TOE AMPUTATIONS INCLUDING METATARSAL HEADS;  Surgeon: Serafina Mitchell, MD;  Location: [place];  Service: Vascular;  Laterality: Bilateral;   AMPUTATION Right 03/01/2019   Procedure: AMPUTATION TOES;  Surgeon: Angelia Mould, MD;  Location: [place];  Service: Vascular;  Laterality: Right;  AMPUTATION Right 05/29/2019   Procedure: AMPUTATION RIGHT THIRD TOE;  Surgeon: Serafina Mitchell, MD;  Location: [place];  Service: Vascular;  Laterality: Right;   AMPUTATION Right 09/26/2019   Procedure: AMPUTATION RIGHT FOURTH TOE AND FIFTH TOES;  Surgeon: Serafina Mitchell, MD;  Location: [place];  Service: Vascular;  Laterality: Right;   ANGIOPLASTY Right 09/20/2017   Procedure: ANGIOPLASTY RIGHT TIBIAL ARTERY;  Surgeon: Serafina Mitchell, MD;  Location: [place];  Service: Vascular;  Laterality: Right;   APPENDECTOMY     APPLICATION OF WOUND VAC  12/17/2020   Procedure: APPLICATION OF WOUND VAC;  Surgeon: Serafina Mitchell, MD;  Location: [place];  Service: Vascular;;   [place]; 2012;   CATARACT EXTRACTION Left 2004   COLONOSCOPY  2004   FLEISHMAN TICS   COLONOSCOPY WITH PROPOFOL N/A 11/28/2018   Procedure: COLONOSCOPY WITH PROPOFOL;  Surgeon: Rogene Houston, MD;  [facility];  Service: Endoscopy;  Laterality: N/A;   CORONARY ANGIOPLASTY WITH STENT PLACEMENT  05/18/2017   "3 stents"   CORONARY STENT INTERVENTION N/A 05/18/2017   Procedure: CORONARY STENT INTERVENTION;  Surgeon: Jettie Booze, MD;  Location: [place] [facility];  Service: Cardiovascular;  Laterality: N/A;   ESOPHAGOGASTRODUODENOSCOPY (EGD) WITH PROPOFOL N/A 11/20/2017   Procedure: ESOPHAGOGASTRODUODENOSCOPY (EGD) WITH PROPOFOL;  Surgeon: Irene Shipper, MD;  Location: [place];  Service: Gastroenterology;  Laterality: N/A;   ESOPHAGOGASTRODUODENOSCOPY (EGD) WITH PROPOFOL N/A 11/28/2018   Procedure: ESOPHAGOGASTRODUODENOSCOPY (EGD) WITH PROPOFOL;  Surgeon: Rogene Houston, MD;  [facility];  Service: Endoscopy;  Laterality: N/A;   ESOPHAGOGASTRODUODENOSCOPY (EGD) WITH PROPOFOL N/A 07/12/2019   Procedure: ESOPHAGOGASTRODUODENOSCOPY (EGD) WITH PROPOFOL;  Surgeon: Rogene Houston, MD;  [facility];  Service: Endoscopy;  Laterality: N/A;  [place] N/A 12/17/2018   Procedure: GIVENS CAPSULE STUDY;  Surgeon: Rogene Houston, MD;  [facility];  Service: Endoscopy;  Laterality: N/A;  7:30am   I & D EXTREMITY Right 12/17/2017   Procedure: IRRIGATION AND DEBRIDEMENT RIGHT GREAT TOE;  Surgeon: Serafina Mitchell, MD;  [facility];  Service: Vascular;  Laterality: Right;   INCISION AND DRAINAGE Right 12/17/2020   Procedure: INCISION AND DRAINAGE OF RIGHT FOOT;  Surgeon: Serafina Mitchell, MD;  [facility];  Service: Vascular;  Laterality: Right;   JOINT REPLACEMENT     LEFT HEART CATH AND CORONARY ANGIOGRAPHY N/A 05/18/2017   Procedure: LEFT HEART CATH AND CORONARY ANGIOGRAPHY;  Surgeon: Jettie Booze, MD;  Location: [place] [facility];  Service: Cardiovascular;  Laterality: N/A;   LOWER EXTREMITY ANGIOGRAM Right 09/20/2017   Procedure: RIGHT LOWER LEG  ANGIOGRAM;  Surgeon: Serafina Mitchell, MD;  [facility];  Service: Vascular;  Laterality: Right;   LUMBAR FUSION  2002   L3, 4 L4, 5 L5 S1 Fused by Dr. Glenna Fellows   PERIPHERAL VASCULAR BALLOON ANGIOPLASTY Left 09/15/2017   Procedure: [place];  Surgeon: Serafina Mitchell, MD;  Location: [place] [facility];  Service: Cardiovascular;  Laterality: Left;  PTA of Peroneal & Posterior Tibial   PERIPHERAL VASCULAR INTERVENTION Right 12/15/2020   Procedure: PERIPHERAL VASCULAR INTERVENTION;  Surgeon: Serafina Mitchell, MD;  Location: [place] [facility];  Service: Cardiovascular;  Laterality: Right;  SFA   POLYPECTOMY  11/28/2018   Procedure: POLYPECTOMY;  Surgeon: Rogene Houston, MD;  [facility];  Service: Endoscopy;;  duodenum   POSTERIOR LUMBAR FUSION     RHINOPLASTY     RIGHT HEART CATH N/A 05/18/2017   Procedure: RIGHT HEART CATH;  Surgeon: Jettie Booze, MD;  Location: [place] [facility];  Service: Cardiovascular;  Laterality: N/A;   TEE WITHOUT CARDIOVERSION N/A 09/18/2017   Procedure: TRANSESOPHAGEAL ECHOCARDIOGRAM (TEE);  Surgeon: Fay Records, MD;  Location: [place];  Service: Cardiovascular;  Laterality: N/A;   TOTAL KNEE ARTHROPLASTY Bilateral    TRANSMETATARSAL AMPUTATION Right 02/04/2020   Procedure: RIGHT TRANSMETATARSAL AMPUTATION;  Surgeon: Serafina Mitchell, MD;  Location: [place];  Service: Vascular;  Laterality: Right;   TRANSMETATARSAL AMPUTATION Left 11/12/2021   Procedure: LEFT TRANSMETATARSAL AMPUTATION;  Surgeon: Waynetta Sandy, MD;  Location: [place];  Service: Vascular;  Laterality: Left;   TRANSURETHRAL RESECTION OF PROSTATE  2001   [place]    Social History   Socioeconomic History   Marital status: Married    Spouse name: patsy   Number of  children: Not on file   Years of education: Not on file   Highest education level: Not on file  Occupational History   Not on file  Tobacco Use   Smoking status: Former    Packs/day: 2.00    Years: 30.00    Total pack years: 60.00    Types: Cigarettes    Start date: 03/28/1953    Quit date: 03/29/1983    Years since quitting: 38.7   Smokeless tobacco: Never  Vaping Use   Vaping Use: Never used  Substance and Sexual Activity   Alcohol use: Not Currently    Comment: 05/18/2017 'I drink a beer q yr"   Drug use: No   Sexual activity: Not Currently  Other Topics Concern   Not on file  Social History Narrative   Patient lives at home with his spouse.    Social Determinants of Health   Financial Resource Strain: Medium Risk (12/11/2018)   Overall Financial Resource Strain (CARDIA)    Difficulty of Paying Living Expenses: Somewhat hard  Food Insecurity: No Food Insecurity (10/08/2020)   Hunger Vital Sign    Worried About Running Out of Food in the Last Year: Never true    Ran Out of Food in the Last Year: Never true  Transportation Needs: No Transportation Needs (07/19/2021)   PRAPARE - Hydrologist (Medical): No    Lack of Transportation (Non-Medical): No  Physical Activity: Not on file  Stress: No Stress Concern Present (07/29/2019)   [place]    Feeling of Stress : Not at all  Social Connections: Not on file  Intimate Partner Violence: Not on file    Family History  Problem Relation Age of Onset   Heart attack Father 105   COPD Father    COPD Mother    Heart disease Mother    Diabetes Mother    Hypertension Sister    CVA Sister    Diabetes Sister    Multiple sclerosis Sister     Current Outpatient Medications  Medication Sig Dispense Refill   Accu-Chek Softclix Lancets lancets      acetaminophen (TYLENOL) 500 MG tablet Take 1 tablet (500 mg total) by mouth every 6 (six)  hours as needed for mild pain or headache. (Patient taking differently: Take 1,000 mg by mouth every 6 (six) hours as needed for mild pain or headache.) 30 tablet 0  Alcohol Swabs (B-D SINGLE USE SWABS REGULAR) PADS      apixaban (ELIQUIS) 2.5 MG TABS tablet Take 1 tablet (2.5 mg total) by mouth 2 (two) times daily. (Patient taking differently: Take 5 mg by mouth 2 (two) times daily.) 60 tablet 11   atorvastatin (LIPITOR) 80 MG tablet Take 80 mg by mouth every evening.      Blood Glucose Monitoring Suppl (ACCU-CHEK GUIDE) w/Device KIT      Cholecalciferol (VITAMIN D3) 50 MCG (2000 UT) TABS Take 2,000 Units by mouth daily.     clopidogrel (PLAVIX) 75 MG tablet Take 1 tablet (75 mg total) by mouth daily with breakfast. 30 tablet 2   Cyanocobalamin (B-12) 2500 MCG TABS Take 2,500 mcg by mouth daily.     DROPLET INSULIN SYRINGE 31G X 5/16" 1 ML MISC      folic acid (FOLVITE) 1 MG tablet Take 1 tablet (1 mg total) by mouth daily. 30 tablet 1   furosemide (LASIX) 40 MG tablet Take 1 tablet (40 mg total) by mouth daily as needed for edema (swelling). 90 tablet 0   insulin glargine (LANTUS) 100 UNIT/ML injection Inject 30 Units into the skin 2 (two) times daily.     ipratropium-albuterol (DUONEB) 0.5-2.5 (3) MG/3ML SOLN Take 3 mLs by nebulization every other day.     isosorbide mononitrate (IMDUR) 60 MG 24 hr tablet Take 1 tablet (60 mg total) by mouth 2 (two) times daily. 180 tablet 3   nitroGLYCERIN (NITROSTAT) 0.4 MG SL tablet PLACE 1 TABLET (0.4 MG TOTAL) UNDER THE TONGUE EVERY 5  MINUTES AS NEEDED FOR CHEST PAIN. (Patient taking differently: Place 0.4 mg under the tongue every 5 (five) minutes as needed for chest pain.) 25 tablet 3   nystatin (MYCOSTATIN) 100000 UNIT/ML suspension Take by mouth.     OXYGEN Inhale 2 L/min into the lungs See admin instructions. 2 L/min of oxygen at bedtime and during the day as needed for shortness of breath     pantoprazole (PROTONIX) 40 MG tablet Take 1 tablet (40 mg  total) by mouth 2 (two) times daily. To protect stomach while taking multiple blood thinners. (Patient taking differently: Take 40 mg by mouth daily. To protect stomach while taking multiple blood thinners.) 60 tablet 2   potassium chloride SA (K-DUR) 20 MEQ tablet Take 1 tablet (20 mEq total) by mouth daily. 30 tablet 1   SPIRIVA HANDIHALER 18 MCG inhalation capsule Place 1 capsule into inhaler and inhale daily after breakfast.      sulfamethoxazole-trimethoprim (BACTRIM DS) 800-160 MG tablet Take 1 tablet by mouth 2 (two) times daily. 84 tablet 0   traZODone (DESYREL) 100 MG tablet Take 150 mg by mouth at bedtime.     TRUE METRIX BLOOD GLUCOSE TEST test strip      No current facility-administered medications for this visit.    Allergies  Allergen Reactions   Codeine Shortness Of Breath   Doxycycline Swelling    Swelling and numbness in lips and face. Swelling improved after stopping. Reports still experiences numbness in bottom lip.    Feraheme [Ferumoxytol] Other (See Comments)    Diaphoretic, chest pain   Heparin Other (See Comments)    +HIT,  Severe bleeding (with heparin drip & large doses), tolerates low doses   Iron Shortness Of Breath   Losartan Swelling   Oxycodone Other (See Comments)    "Made me act out of my mind" Mental status changes- hallucinations   Latex Rash     REVIEW  OF SYSTEMS:  [X]  denotes positive finding, [ ]  denotes negative finding Cardiac  Comments:  Chest pain or chest pressure:    Shortness of breath upon exertion:    Short of breath when lying flat:    Irregular heart rhythm:        Vascular    Pain in calf, thigh, or hip brought on by ambulation:    Pain in feet at night that wakes you up from your sleep:     Blood clot in your veins:    Leg swelling:         Pulmonary    Oxygen at home:    Productive cough:     Wheezing:         Neurologic    Sudden weakness in arms or legs:     Sudden numbness in arms or legs:     Sudden onset of  difficulty speaking or slurred speech:    Temporary loss of vision in one eye:     Problems with dizziness:         Gastrointestinal    Blood in stool:     Vomited blood:         Genitourinary    Burning when urinating:     Blood in urine:        Psychiatric    Major depression:         Hematologic    Bleeding problems:    Problems with blood clotting too easily:        Skin    Rashes or ulcers:        Constitutional    Fever or chills:      PHYSICAL EXAMINATION:  Vitals:   12/20/21 1035  BP: (!) 132/55  Pulse: (!) 53  Resp: 16  Temp: (!) 97.4 F (36.3 C)  TempSrc: Temporal  SpO2: 99%  Weight: 179 lb (81.2 kg)  Height: 5' 7"  (1.702 m)    General:  WDWN in NAD; vital signs documented above Gait: Not observed, in wheel chair HENT: WNL, normocephalic Pulmonary: normal non-labored breathing Cardiac: regular HR Vascular Exam/Pulses: well perfused and warm. left TMA incision with superficial separation as shown below. Staples removed     Musculoskeletal: no muscle wasting or atrophy  Neurologic: A&O X 3;  No focal weakness or paresthesias are detected Psychiatric:  The pt has Normal affect.  ASSESSMENT/PLAN:: 88 y.o. male here for follow up for left TMA. - left leg is well perfused and warm. There is some central separation of TMA incision - Staples removed today - Continue daily wound care and washing with soap and water, pat dry - Continue course of Bactrim - Heal weightbearing only -He will follow up in 3 weeks for wound check. Advised patient to call for earlier follow up if worsening appearance of wound, increased redness, drainage/purulence, increased pain, swelling or warmth of foot   Karoline Caldwell, PA-C [facility] MD:   Trula Slade

## 2021-12-20 ENCOUNTER — Encounter: Payer: Self-pay | Admitting: Physician Assistant

## 2021-12-20 ENCOUNTER — Ambulatory Visit (INDEPENDENT_AMBULATORY_CARE_PROVIDER_SITE_OTHER): Payer: Medicare HMO | Admitting: Physician Assistant

## 2021-12-20 VITALS — BP 132/55 | HR 53 | Temp 97.4°F | Resp 16 | Ht 67.0 in | Wt 179.0 lb

## 2021-12-20 DIAGNOSIS — I739 Peripheral vascular disease, unspecified: Secondary | ICD-10-CM

## 2021-12-20 DIAGNOSIS — T8189XD Other complications of procedures, not elsewhere classified, subsequent encounter: Secondary | ICD-10-CM

## 2021-12-22 DIAGNOSIS — E1122 Type 2 diabetes mellitus with diabetic chronic kidney disease: Secondary | ICD-10-CM | POA: Diagnosis not present

## 2021-12-22 DIAGNOSIS — Z89432 Acquired absence of left foot: Secondary | ICD-10-CM | POA: Diagnosis not present

## 2021-12-22 DIAGNOSIS — Z4781 Encounter for orthopedic aftercare following surgical amputation: Secondary | ICD-10-CM | POA: Diagnosis not present

## 2021-12-22 DIAGNOSIS — I130 Hypertensive heart and chronic kidney disease with heart failure and stage 1 through stage 4 chronic kidney disease, or unspecified chronic kidney disease: Secondary | ICD-10-CM | POA: Diagnosis not present

## 2021-12-22 DIAGNOSIS — J449 Chronic obstructive pulmonary disease, unspecified: Secondary | ICD-10-CM | POA: Diagnosis not present

## 2021-12-22 DIAGNOSIS — I2510 Atherosclerotic heart disease of native coronary artery without angina pectoris: Secondary | ICD-10-CM | POA: Diagnosis not present

## 2021-12-22 DIAGNOSIS — N1832 Chronic kidney disease, stage 3b: Secondary | ICD-10-CM | POA: Diagnosis not present

## 2021-12-22 DIAGNOSIS — E1152 Type 2 diabetes mellitus with diabetic peripheral angiopathy with gangrene: Secondary | ICD-10-CM | POA: Diagnosis not present

## 2021-12-22 DIAGNOSIS — I5032 Chronic diastolic (congestive) heart failure: Secondary | ICD-10-CM | POA: Diagnosis not present

## 2022-01-10 ENCOUNTER — Ambulatory Visit: Payer: Medicare HMO

## 2022-01-10 ENCOUNTER — Ambulatory Visit (INDEPENDENT_AMBULATORY_CARE_PROVIDER_SITE_OTHER): Payer: Medicare HMO | Admitting: Physician Assistant

## 2022-01-10 VITALS — BP 124/69 | HR 60 | Temp 97.3°F | Resp 18 | Ht 67.0 in | Wt 178.0 lb

## 2022-01-10 DIAGNOSIS — I739 Peripheral vascular disease, unspecified: Secondary | ICD-10-CM

## 2022-01-10 NOTE — Progress Notes (Signed)
POST OPERATIVE OFFICE NOTE    CC:  F/u for surgery  HPI:  Nathanyel Defenbaugh is a 88 y.o. male who is s/p left TMA by [name] on 11/12/2021.  He has a history of right TMA in November 2021.  He has been ambulating with a postop shoe.  He was last seen on 12/20/2021 with some central separation of the left TMA incision.  His staples were removed, and he was to continue daily wound care and washing with soap and water, pat dry.  He was advised to be heel weightbearing only and follow-up with [place] in 3 weeks for wound check.  Pt returns today for follow up.  Pt denies any discomfort.  He has been washing his incision daily with mild soap and water.  He is ambulating well with a rolling walker in his post op shoe.  He denies any erythema or drainage from the TMA, or fevers.  He will finish his 6 wk course of Bactrim next week.   Allergies  Allergen Reactions   Codeine Shortness Of Breath   Doxycycline Swelling    Swelling and numbness in lips and face. Swelling improved after stopping. Reports still experiences numbness in bottom lip.    Feraheme [Ferumoxytol] Other (See Comments)    Diaphoretic, chest pain   Heparin Other (See Comments)    +HIT,  Severe bleeding (with heparin drip & large doses), tolerates low doses   Iron Shortness Of Breath   Losartan Swelling   Oxycodone Other (See Comments)    "Made me act out of my mind" Mental status changes- hallucinations   Latex Rash    Current Outpatient Medications  Medication Sig Dispense Refill   Accu-Chek Softclix Lancets lancets      acetaminophen (TYLENOL) 500 MG tablet Take 1 tablet (500 mg total) by mouth every 6 (six) hours as needed for mild pain or headache. (Patient taking differently: Take 1,000 mg by mouth every 6 (six) hours as needed for mild pain or headache.) 30 tablet 0   Alcohol Swabs (B-D SINGLE USE SWABS REGULAR) PADS      apixaban (ELIQUIS) 2.5 MG TABS tablet Take 1 tablet (2.5 mg total) by mouth 2 (two) times daily. (Patient  taking differently: Take 5 mg by mouth 2 (two) times daily.) 60 tablet 11   atorvastatin (LIPITOR) 80 MG tablet Take 80 mg by mouth every evening.      Blood Glucose Monitoring Suppl (ACCU-CHEK GUIDE) w/Device KIT      Cholecalciferol (VITAMIN D3) 50 MCG (2000 UT) TABS Take 2,000 Units by mouth daily.     clopidogrel (PLAVIX) 75 MG tablet Take 1 tablet (75 mg total) by mouth daily with breakfast. 30 tablet 2   Cyanocobalamin (B-12) 2500 MCG TABS Take 2,500 mcg by mouth daily.     DROPLET INSULIN SYRINGE 31G X 5/16" 1 ML MISC      folic acid (FOLVITE) 1 MG tablet Take 1 tablet (1 mg total) by mouth daily. 30 tablet 1   furosemide (LASIX) 40 MG tablet Take 1 tablet (40 mg total) by mouth daily as needed for edema (swelling). 90 tablet 0   insulin glargine (LANTUS) 100 UNIT/ML injection Inject 30 Units into the skin 2 (two) times daily.     ipratropium-albuterol (DUONEB) 0.5-2.5 (3) MG/3ML SOLN Take 3 mLs by nebulization every other day.     isosorbide mononitrate (IMDUR) 60 MG 24 hr tablet Take 1 tablet (60 mg total) by mouth 2 (two) times daily. 180 tablet 3  nitroGLYCERIN (NITROSTAT) 0.4 MG SL tablet PLACE 1 TABLET (0.4 MG TOTAL) UNDER THE TONGUE EVERY 5  MINUTES AS NEEDED FOR CHEST PAIN. (Patient taking differently: Place 0.4 mg under the tongue every 5 (five) minutes as needed for chest pain.) 25 tablet 3   nystatin (MYCOSTATIN) 100000 UNIT/ML suspension Take by mouth.     OXYGEN Inhale 2 L/min into the lungs See admin instructions. 2 L/min of oxygen at bedtime and during the day as needed for shortness of breath     pantoprazole (PROTONIX) 40 MG tablet Take 1 tablet (40 mg total) by mouth 2 (two) times daily. To protect stomach while taking multiple blood thinners. (Patient taking differently: Take 40 mg by mouth daily. To protect stomach while taking multiple blood thinners.) 60 tablet 2   potassium chloride SA (K-DUR) 20 MEQ tablet Take 1 tablet (20 mEq total) by mouth daily. 30 tablet 1    SPIRIVA HANDIHALER 18 MCG inhalation capsule Place 1 capsule into inhaler and inhale daily after breakfast.      traZODone (DESYREL) 100 MG tablet Take 150 mg by mouth at bedtime.     TRUE METRIX BLOOD GLUCOSE TEST test strip      No current facility-administered medications for this visit.     ROS:  See HPI  Physical Exam:  Incision: Left TMA incision with scabbing over central area of incision. Medial and lateral portions of incision have healed well Extremities: Brisk left DP/PT doppler signal Neuro: intact   Assessment/Plan:  This is a 88 y.o. male who is s/p: left TMA by Dr.Cain on 11/12/2021   -L TMA incision healing appropriately. Central area of separation has scabbed over. No areas of necrosis or signs of infection -He can finish his 6 week course of Bactrim -Continue daily wound care and protection of the foot -Heel weight bearing only in post op shoe until incision has fully healed -He can follow up with [place] in 4 weeks for wound check. Advised patient to call if the appearance of his wound worsens   Vicente Serene, PA-C [facility] [phone]   Clinic MD:  Trula Slade

## 2022-01-17 ENCOUNTER — Telehealth: Payer: Self-pay | Admitting: *Deleted

## 2022-01-17 NOTE — Chronic Care Management (AMB) (Signed)
  Care Coordination   Note   01/17/2022 Name: Mike Wright MRN: [id number] DOB: 13-Sep-1933  [name] is a 88 y.o. year old male who sees Monico Blitz, MD for primary care. I reached out to [name] by phone today to offer care coordination services.  [name] was given information about Care Coordination services today including:   The Care Coordination services include support from the care team which includes your Nurse Coordinator, Clinical Social Worker, or Pharmacist.  The Care Coordination team is here to help remove barriers to the health concerns and goals most important to you. Care Coordination services are voluntary, and the patient may decline or stop services at any time by request to their care team member.   Care Coordination Consent Status: Patient agreed to services and verbal consent obtained.   Follow up plan:  Telephone appointment with care coordination team member scheduled for:  01/25/22  Encounter Outcome:  Pt. Scheduled  [place]  Direct Dial: [phone]

## 2022-01-20 ENCOUNTER — Telehealth: Payer: Self-pay

## 2022-01-20 NOTE — Telephone Encounter (Signed)
Sarah, RN with [facility] called stating that the pt fell on Monday when he was outside. The fall lifted up the scab on his L TMA with some slight bleeding. She was requesting a verbal ok to do betadine, non-stick pad, and Kerlex daily.  Reviewed pt's chart, spoke with Gwenette Greet, [place], returned call for clarification, two identifiers used. Relayed advice to only use betadine once since it can damage healthy tissue. Judson Roch stated that she didn't have any and would just continue the dry dressings and keep monitoring. Confirmed understanding.

## 2022-01-25 ENCOUNTER — Encounter: Payer: Self-pay | Admitting: *Deleted

## 2022-01-25 ENCOUNTER — Ambulatory Visit: Payer: Self-pay | Admitting: *Deleted

## 2022-01-25 NOTE — Patient Outreach (Signed)
  Care Coordination   01/25/2022 Name: Mike Wright MRN: [id number] DOB: 01-04-1934   Care Coordination Outreach Attempts:  An unsuccessful telephone outreach was attempted for a scheduled appointment today.  Follow Up Plan:  Additional outreach attempts will be made to offer the patient care coordination information and services.   Encounter Outcome:  No Answer  Care Coordination Interventions Activated:  No   Care Coordination Interventions:  No, not indicated    Chong Sicilian, BSN, RN-BC RN Care Coordinator [place]: [phone] Main #: [phone]

## 2022-02-02 NOTE — Progress Notes (Signed)
Office Note     CC:  follow up Requesting Provider:  Monico Blitz, MD  HPI: Mike Wright is a 88 y.o. (March 23, 1934) male who presents for wound check. He is s/p left TMA by [name] on 11/12/2021.  Has history of right TMA as well in November of 2021. He has been ambulating with a postop shoe with heel weight bearing only. He was last seen on 01/10/22 at which time he had brisk DP and PT Doppler flow. The wound was healing well with a central scab. No signs of infection. He was doing well with [facility] PT and continuing to ambulate on his own. He also was still finishing up his 6 weeks of Bactrim.   Today he reports no pain in his legs or feet. He ambulates around the house a good bit with help of rolling walker. He says his balance is still unsteady. He did recently fall. He says a friend was visiting and he went out to say hello to them in their car and fell and hit his head, right elbow and also pulled back the scab on his left TMA a little such that there was a little bleeding. He reports the bleeding stopped after a day or so. No further issues. His wife continues to clean and take care of TMA. Small scab still is present. He completed the full course of antibiotics since his last visit.   The pt is on a statin for cholesterol management.  The pt is not on a daily aspirin.   Other AC:  Eliquis, Plavix The pt is not on medication for hypertension.   The pt is diabetic. Tobacco hx:  Former, 1985  Past Medical History:  Diagnosis Date   Anemia    a. mild, noted 04/2017.   CAD in native artery    a. [name] 04/2017 s/p DES to D1, DES to prox-mid LAD, DES to prox LAD overlapping the prior stent, LVEF 55-65%.    Chronic diastolic CHF (congestive heart failure) (HCC)    Constipation    COPD (chronic obstructive pulmonary disease) (HCC)    Diabetic ulcer of toe (HCC)    DJD (degenerative joint disease) of cervical spine    Dysrhythmia    AFib   Essential hypertension    GERD (gastroesophageal  reflux disease)    History of hiatal hernia    HIT (heparin-induced thrombocytopenia) ([place])    Hypothyroidism    Hypoxia    a. went home on home O2 04/2017.   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   Retinitis    Sinus bradycardia    a. HR 30s-40s in 04/2017 -> diltiazem stopped, metoprolol reduced.   Sleep apnea    "chose not to order CPAP at this time" (05/18/2017)   Type 2 diabetes mellitus ([place])    Wears glasses    Wheezing    a. suspected COPD 04/2017. Former tobacco x 40 years.    Past Surgical History:  Procedure Laterality Date   ABDOMINAL AORTOGRAM W/LOWER EXTREMITY N/A 09/15/2017   Procedure: ABDOMINAL AORTOGRAM W/LOWER EXTREMITY;  Surgeon: Serafina Mitchell, MD;  Location: [place] [facility];  Service: Cardiovascular;  Laterality: N/A;   ABDOMINAL AORTOGRAM W/LOWER EXTREMITY N/A 02/04/2020   Procedure: ABDOMINAL AORTOGRAM W/LOWER EXTREMITY;  Surgeon: Serafina Mitchell, MD;  Location: [place] [facility];  Service: Cardiovascular;  Laterality: N/A;   ABDOMINAL AORTOGRAM W/LOWER EXTREMITY N/A 12/15/2020   Procedure: ABDOMINAL AORTOGRAM W/LOWER EXTREMITY;  Surgeon: Serafina Mitchell, MD;  Location: [place] [facility];  Service: Cardiovascular;  Laterality: N/A;   AMPUTATION Bilateral 09/20/2017   Procedure: BILATERAL GREAT TOE AMPUTATIONS INCLUDING METATARSAL HEADS;  Surgeon: Serafina Mitchell, MD;  Location: [place];  Service: Vascular;  Laterality: Bilateral;   AMPUTATION Right 03/01/2019   Procedure: AMPUTATION TOES;  Surgeon: Angelia Mould, MD;  Location: [place];  Service: Vascular;  Laterality: Right;   AMPUTATION Right 05/29/2019   Procedure: AMPUTATION RIGHT THIRD TOE;  Surgeon: Serafina Mitchell, MD;  Location: [place];  Service: Vascular;  Laterality: Right;   AMPUTATION Right 09/26/2019    Procedure: AMPUTATION RIGHT FOURTH TOE AND FIFTH TOES;  Surgeon: Serafina Mitchell, MD;  Location: [place];  Service: Vascular;  Laterality: Right;   ANGIOPLASTY Right 09/20/2017   Procedure: ANGIOPLASTY RIGHT TIBIAL ARTERY;  Surgeon: Serafina Mitchell, MD;  Location: [place];  Service: Vascular;  Laterality: Right;   APPENDECTOMY     APPLICATION OF WOUND VAC  12/17/2020   Procedure: APPLICATION OF WOUND VAC;  Surgeon: Serafina Mitchell, MD;  Location: [place];  Service: Vascular;;   [place]; 2012;   CATARACT EXTRACTION Left 2004   COLONOSCOPY  2004   FLEISHMAN TICS   COLONOSCOPY WITH PROPOFOL N/A 11/28/2018   Procedure: COLONOSCOPY WITH PROPOFOL;  Surgeon: Rogene Houston, MD;  [facility];  Service: Endoscopy;  Laterality: N/A;   CORONARY ANGIOPLASTY WITH STENT PLACEMENT  05/18/2017   "3 stents"   CORONARY STENT INTERVENTION N/A 05/18/2017   Procedure: CORONARY STENT INTERVENTION;  Surgeon: Jettie Booze, MD;  Location: [place] [facility];  Service: Cardiovascular;  Laterality: N/A;   ESOPHAGOGASTRODUODENOSCOPY (EGD) WITH PROPOFOL N/A 11/20/2017   Procedure: ESOPHAGOGASTRODUODENOSCOPY (EGD) WITH PROPOFOL;  Surgeon: Irene Shipper, MD;  Location: [place];  Service: Gastroenterology;  Laterality: N/A;   ESOPHAGOGASTRODUODENOSCOPY (EGD) WITH PROPOFOL N/A 11/28/2018   Procedure: ESOPHAGOGASTRODUODENOSCOPY (EGD) WITH PROPOFOL;  Surgeon: Rogene Houston, MD;  [facility];  Service: Endoscopy;  Laterality: N/A;   ESOPHAGOGASTRODUODENOSCOPY (EGD) WITH PROPOFOL N/A 07/12/2019   Procedure: ESOPHAGOGASTRODUODENOSCOPY (EGD) WITH PROPOFOL;  Surgeon: Rogene Houston, MD;  [facility];  Service: Endoscopy;  Laterality: N/A;  [place] N/A 12/17/2018   Procedure: GIVENS CAPSULE STUDY;  Surgeon: Rogene Houston, MD;  [facility];  Service: Endoscopy;  Laterality: N/A;  7:30am   I & D EXTREMITY Right  12/17/2017   Procedure: IRRIGATION AND DEBRIDEMENT RIGHT GREAT TOE;  Surgeon: Serafina Mitchell, MD;  [facility];  Service: Vascular;  Laterality: Right;   INCISION AND DRAINAGE Right 12/17/2020   Procedure: INCISION AND DRAINAGE OF RIGHT FOOT;  Surgeon: Serafina Mitchell, MD;  [facility];  Service: Vascular;  Laterality: Right;   JOINT REPLACEMENT     LEFT HEART CATH AND CORONARY ANGIOGRAPHY N/A 05/18/2017   Procedure: LEFT HEART CATH AND CORONARY ANGIOGRAPHY;  Surgeon: Jettie Booze, MD;  Location: [place] [facility];  Service: Cardiovascular;  Laterality: N/A;   LOWER EXTREMITY ANGIOGRAM Right 09/20/2017   Procedure: RIGHT LOWER LEG  ANGIOGRAM;  Surgeon: Serafina Mitchell, MD;  Location: [place];  Service: Vascular;  Laterality: Right;  LUMBAR FUSION  2002   L3, 4 L4, 5 L5 S1 Fused by Dr. Glenna Fellows   PERIPHERAL VASCULAR BALLOON ANGIOPLASTY Left 09/15/2017   Procedure: PERIPHERAL VASCULAR BALLOON ANGIOPLASTY;  Surgeon: Serafina Mitchell, MD;  Location: [place] [facility];  Service: Cardiovascular;  Laterality: Left;  PTA of Peroneal & Posterior Tibial   PERIPHERAL VASCULAR INTERVENTION Right 12/15/2020   Procedure: PERIPHERAL VASCULAR INTERVENTION;  Surgeon: Serafina Mitchell, MD;  Location: [place] [facility];  Service: Cardiovascular;  Laterality: Right;  SFA   POLYPECTOMY  11/28/2018   Procedure: POLYPECTOMY;  Surgeon: Rogene Houston, MD;  [facility];  Service: Endoscopy;;  duodenum   POSTERIOR LUMBAR FUSION     RHINOPLASTY     RIGHT HEART CATH N/A 05/18/2017   Procedure: RIGHT HEART CATH;  Surgeon: Jettie Booze, MD;  Location: [place] [facility];  Service: Cardiovascular;  Laterality: N/A;   TEE WITHOUT CARDIOVERSION N/A 09/18/2017   Procedure: TRANSESOPHAGEAL ECHOCARDIOGRAM (TEE);  Surgeon: Fay Records, MD;  Location: [place];  Service: Cardiovascular;  Laterality: N/A;   TOTAL KNEE ARTHROPLASTY Bilateral    TRANSMETATARSAL AMPUTATION Right 02/04/2020    Procedure: RIGHT TRANSMETATARSAL AMPUTATION;  Surgeon: Serafina Mitchell, MD;  Location: [place];  Service: Vascular;  Laterality: Right;   TRANSMETATARSAL AMPUTATION Left 11/12/2021   Procedure: LEFT TRANSMETATARSAL AMPUTATION;  Surgeon: Waynetta Sandy, MD;  Location: [place];  Service: Vascular;  Laterality: Left;   TRANSURETHRAL RESECTION OF PROSTATE  2001   [place]    Social History   Socioeconomic History   Marital status: Married    Spouse name: patsy   Number of children: Not on file   Years of education: Not on file   Highest education level: Not on file  Occupational History   Not on file  Tobacco Use   Smoking status: Former    Packs/day: 2.00    Years: 30.00    Total pack years: 60.00    Types: Cigarettes    Start date: 03/28/1953    Quit date: 03/29/1983    Years since quitting: 38.8   Smokeless tobacco: Never  Vaping Use   Vaping Use: Never used  Substance and Sexual Activity   Alcohol use: Not Currently    Comment: 05/18/2017 'I drink a beer q yr"   Drug use: No   Sexual activity: Not Currently  Other Topics Concern   Not on file  Social History Narrative   Patient lives at home with his spouse.    Social Determinants of Health   Financial Resource Strain: Medium Risk (12/11/2018)   Overall Financial Resource Strain (CARDIA)    Difficulty of Paying Living Expenses: Somewhat hard  Food Insecurity: No Food Insecurity (10/08/2020)   Hunger Vital Sign    Worried About Running Out of Food in the Last Year: Never true    Ran Out of Food in the Last Year: Never true  Transportation Needs: No Transportation Needs (07/19/2021)   PRAPARE - Hydrologist (Medical): No    Lack of Transportation (Non-Medical): No  Physical Activity: Not on file  Stress: No Stress Concern Present (07/29/2019)   [place]    Feeling of Stress : Not at all  Social Connections: Not on file   Intimate Partner Violence: Not on file    Family History  Problem Relation Age of Onset   Heart attack Father 53   COPD  Father    COPD Mother    Heart disease Mother    Diabetes Mother    Hypertension Sister    CVA Sister    Diabetes Sister    Multiple sclerosis Sister     Current Outpatient Medications  Medication Sig Dispense Refill   Accu-Chek Softclix Lancets lancets      acetaminophen (TYLENOL) 500 MG tablet Take 1 tablet (500 mg total) by mouth every 6 (six) hours as needed for mild pain or headache. (Patient taking differently: Take 1,000 mg by mouth every 6 (six) hours as needed for mild pain or headache.) 30 tablet 0   Alcohol Swabs (B-D SINGLE USE SWABS REGULAR) PADS      apixaban (ELIQUIS) 2.5 MG TABS tablet Take 1 tablet (2.5 mg total) by mouth 2 (two) times daily. (Patient taking differently: Take 5 mg by mouth 2 (two) times daily.) 60 tablet 11   atorvastatin (LIPITOR) 80 MG tablet Take 80 mg by mouth every evening.      Blood Glucose Monitoring Suppl (ACCU-CHEK GUIDE) w/Device KIT      Cholecalciferol (VITAMIN D3) 50 MCG (2000 UT) TABS Take 2,000 Units by mouth daily.     clopidogrel (PLAVIX) 75 MG tablet Take 1 tablet (75 mg total) by mouth daily with breakfast. 30 tablet 2   Cyanocobalamin (B-12) 2500 MCG TABS Take 2,500 mcg by mouth daily.     DROPLET INSULIN SYRINGE 31G X 5/16" 1 ML MISC      folic acid (FOLVITE) 1 MG tablet Take 1 tablet (1 mg total) by mouth daily. 30 tablet 1   furosemide (LASIX) 40 MG tablet TAKE 1 TABLET EVERY DAY AS NEEDED FOR EDEMA (SWELLING) 90 tablet 1   insulin glargine (LANTUS) 100 UNIT/ML injection Inject 30 Units into the skin 2 (two) times daily.     ipratropium-albuterol (DUONEB) 0.5-2.5 (3) MG/3ML SOLN Take 3 mLs by nebulization every other day.     isosorbide mononitrate (IMDUR) 60 MG 24 hr tablet Take 1 tablet (60 mg total) by mouth 2 (two) times daily. 180 tablet 3   nitroGLYCERIN (NITROSTAT) 0.4 MG SL tablet PLACE 1 TABLET  (0.4 MG TOTAL) UNDER THE TONGUE EVERY 5  MINUTES AS NEEDED FOR CHEST PAIN. (Patient taking differently: Place 0.4 mg under the tongue every 5 (five) minutes as needed for chest pain.) 25 tablet 3   OXYGEN Inhale 2 L/min into the lungs See admin instructions. 2 L/min of oxygen at bedtime and during the day as needed for shortness of breath     pantoprazole (PROTONIX) 40 MG tablet Take 1 tablet (40 mg total) by mouth 2 (two) times daily. To protect stomach while taking multiple blood thinners. (Patient taking differently: Take 40 mg by mouth daily. To protect stomach while taking multiple blood thinners.) 60 tablet 2   potassium chloride SA (K-DUR) 20 MEQ tablet Take 1 tablet (20 mEq total) by mouth daily. 30 tablet 1   SPIRIVA HANDIHALER 18 MCG inhalation capsule Place 1 capsule into inhaler and inhale daily after breakfast.      traZODone (DESYREL) 100 MG tablet Take 150 mg by mouth at bedtime.     TRUE METRIX BLOOD GLUCOSE TEST test strip      nystatin (MYCOSTATIN) 100000 UNIT/ML suspension Take by mouth. (Patient not taking: Reported on 02/07/2022)     No current facility-administered medications for this visit.    Allergies  Allergen Reactions   Codeine Shortness Of Breath   Doxycycline Swelling    Swelling and  numbness in lips and face. Swelling improved after stopping. Reports still experiences numbness in bottom lip.    Feraheme [Ferumoxytol] Other (See Comments)    Diaphoretic, chest pain   Heparin Other (See Comments)    +HIT,  Severe bleeding (with heparin drip & large doses), tolerates low doses   Iron Shortness Of Breath   Losartan Swelling   Oxycodone Other (See Comments)    "Made me act out of my mind" Mental status changes- hallucinations   Latex Rash     REVIEW OF SYSTEMS:  _0  denotes positive finding, _1  denotes negative finding Cardiac  Comments:  Chest pain or chest pressure:    Shortness of breath upon exertion:    Short of breath when lying flat:    Irregular  heart rhythm:        Vascular    Pain in calf, thigh, or hip brought on by ambulation:    Pain in feet at night that wakes you up from your sleep:     Blood clot in your veins:    Leg swelling:         Pulmonary    Oxygen at home:    Productive cough:     Wheezing:         Neurologic    Sudden weakness in arms or legs:     Sudden numbness in arms or legs:     Sudden onset of difficulty speaking or slurred speech:    Temporary loss of vision in one eye:     Problems with dizziness:         Gastrointestinal    Blood in stool:     Vomited blood:         Genitourinary    Burning when urinating:     Blood in urine:        Psychiatric    Major depression:         Hematologic    Bleeding problems:    Problems with blood clotting too easily:        Skin    Rashes or ulcers:        Constitutional    Fever or chills:      PHYSICAL EXAMINATION:  Vitals:   02/07/22 1023  BP: 133/70  Pulse: 64  Resp: 18  Temp: (!) 97.1 F (36.2 C)  TempSrc: Temporal  SpO2: 98%  Weight: 181 lb (82.1 kg)  Height: _2  (1.702 m)    General:  WDWN in NAD; vital signs documented above Gait: Not observed HENT: WNL, normocephalic Pulmonary: normal non-labored breathing Cardiac: regular HR Vascular Exam/Pulses: Brisk doppler DP/PT signals. Left foot warm and well perfused. TMA almost completely healed. Central eschar present. No signs of infection. No bleeding.  Neurologic: A&O X 3  ASSESSMENT/PLAN:: 88 y.o. male here for follow up of left TMA. TMA is almost completely healed. There is central eschar. No signs of infection. He has no claudication or rest pain. No new tissue loss. Recommend that he continue heel weight bearing until eschar is off and foot is well healed. I did write prescription for [employer] for orthotic for his shoe - Patient and wife know to call for earlier follow up if any concerns about new symptoms or if wound appears to be worsening - continue Plavix and  statin - he will follow up in 3 months with ABI   Karoline Caldwell, PA-C [facility] [phone]  Clinic MD:  Trula Slade

## 2022-02-07 ENCOUNTER — Other Ambulatory Visit: Payer: Self-pay | Admitting: Cardiology

## 2022-02-07 ENCOUNTER — Encounter: Payer: Self-pay | Admitting: Physician Assistant

## 2022-02-07 ENCOUNTER — Ambulatory Visit (INDEPENDENT_AMBULATORY_CARE_PROVIDER_SITE_OTHER): Payer: Medicare HMO | Admitting: Physician Assistant

## 2022-02-07 VITALS — BP 133/70 | HR 64 | Temp 97.1°F | Resp 18 | Ht 67.0 in | Wt 181.0 lb

## 2022-02-07 DIAGNOSIS — T8189XD Other complications of procedures, not elsewhere classified, subsequent encounter: Secondary | ICD-10-CM

## 2022-02-07 DIAGNOSIS — Z89431 Acquired absence of right foot: Secondary | ICD-10-CM

## 2022-02-07 DIAGNOSIS — I739 Peripheral vascular disease, unspecified: Secondary | ICD-10-CM

## 2022-02-11 ENCOUNTER — Other Ambulatory Visit: Payer: Self-pay

## 2022-02-11 ENCOUNTER — Ambulatory Visit: Payer: Self-pay | Admitting: *Deleted

## 2022-02-11 ENCOUNTER — Encounter: Payer: Self-pay | Admitting: *Deleted

## 2022-02-11 DIAGNOSIS — I739 Peripheral vascular disease, unspecified: Secondary | ICD-10-CM

## 2022-02-11 NOTE — Patient Outreach (Signed)
  Care Coordination   Initial Visit Note   02/11/2022 Name: Mike Wright MRN: [id number] DOB: [birth year]  Mike Wright is a 88 y.o. year old male who sees Monico Blitz, MD for primary care. I spoke with  Mike Wright by phone today.  What matters to the patients health and wellness today?  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 and patient's adherence to plan as established by provider Reviewed medications with patient and discussed access and affordability Reviewed scheduled/upcoming provider appointments including 05/02/22 Vascular Surgeon and ABIs Discussed plans with patient for ongoing care management follow up and provided patient with direct contact information for care management team Assessed social determinant of health barriers Discussed mobility and ability to perform ADLs Using cane and rolling walker for ambulation Still has problems with balance Wearing hard bottom shoes when ambulating Assessed family support Lives at home with wife, who is very helpful, and has assistance from niece for transportation Explained that the forefoot plays a big role in balance and gait stability Discussed completed PT and OT. Pt doesn't feel that it helped.  Reviewed and discussed recent visit with ortho surgeon. Patient is on track and wound has healed.  Provided patient/caregiver with information regarding Care Coordination Services Provided with [facility] direct contact number [phone] and encouraged to reach out as needed Telephone f/u scheduled with [facility] 03/17/22         SDOH assessments and interventions completed:  Yes  SDOH Interventions Today    Flowsheet Row Most Recent Value  SDOH Interventions   Housing Interventions Intervention Not Indicated   Transportation Interventions Intervention Not Indicated  Utilities Interventions Intervention Not Indicated  Financial Strain Interventions Intervention Not Indicated        Care Coordination Interventions Activated:  Yes  Care Coordination Interventions:  Yes, provided   Follow up plan: Follow up call scheduled for 03/17/22    Encounter Outcome:  Pt. Visit Completed   Chong Sicilian, BSN, RN-BC RN Care Coordinator [place]: [phone] Main #: [phone]

## 2022-02-20 DIAGNOSIS — Z89432 Acquired absence of left foot: Secondary | ICD-10-CM | POA: Diagnosis not present

## 2022-02-20 DIAGNOSIS — I2510 Atherosclerotic heart disease of native coronary artery without angina pectoris: Secondary | ICD-10-CM | POA: Diagnosis not present

## 2022-02-20 DIAGNOSIS — Z4781 Encounter for orthopedic aftercare following surgical amputation: Secondary | ICD-10-CM | POA: Diagnosis not present

## 2022-02-20 DIAGNOSIS — E1151 Type 2 diabetes mellitus with diabetic peripheral angiopathy without gangrene: Secondary | ICD-10-CM | POA: Diagnosis not present

## 2022-03-17 ENCOUNTER — Encounter: Payer: Self-pay | Admitting: *Deleted

## 2022-03-17 ENCOUNTER — Ambulatory Visit: Payer: Self-pay | Admitting: *Deleted

## 2022-03-17 NOTE — Patient Outreach (Signed)
Care Coordination   Follow Up Visit Note   03/17/2022 Name: Mike Wright MRN: [id number] DOB: 03-02-1934  Mike Wright is a 88 y.o. year old male who sees Monico Blitz, MD for primary care. I spoke with  Mike Wright by phone today.  What matters to the patients health and wellness today?  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 Injury to elbow that continues to have "a pocket of fluid." Elbow is tender to the touch and bruising is improving. Normal ROM. Recommended to lightly wrap the elbow to provide a little compression and can apply ice for 10 minutes or so several times a day. Have PCP take a look at it at his appt on 03/22/22.  Wearing hard bottom shoes when ambulating Assessed family support Lives at home with wife, who is very helpful, and has assistance from 2 nieces for transportation and other needs Wound has healed and a large scab came off last week in the shower. No open wounds on either foot at present.  Provided with [facility] direct contact number [phone] and encouraged to reach out as needed       Oxygen Needs       Care Coordination Interventions: Evaluation of current treatment plan related to oxygen needs and  patient's adherence to plan as established by provider Advised patient to talk to PCP about desire to switch over to the [employer] [facility] system Reviewed scheduled/upcoming provider appointments including Dr Manuella Ghazi on 03/23/23 per patient Currently using 2L via [place] PRN with PCO and floor concentrator Per patient he has to re-evaluated for oxygen needs. He was a little anxious about what that means and has been trying to prepare himself in case he doesn't qualify. Provided education and reassurance on what that appointment is likely for and that it's unlikely that he won't qualify. Since it has been 5 years since it was last prescribed, he has to be re-certified. Likely do a walk test for the POC. Also has a floor concentrator but there aren't any specific tests that need to be done for that.  Advised that now is the time to switch oxygen providers if he wants to because once Medicare locks into a contract with an oxygen DME provider he won't be able to change again for 5 more years, with insurance coverage Discussed benefits of [employer] system and Medicare coverage. Patient talked with them earlier today and will take the information he was provided over the telephone with him to that appt Encouraged to reach out to [facility] as needed         SDOH assessments and interventions completed:  No     Care Coordination Interventions:  Yes, provided   Follow up plan: Follow up call scheduled  for 04/13/22    Encounter Outcome:  Pt. Visit Completed   Chong Sicilian, BSN, RN-BC [place]: [phone] Main #: [phone]

## 2022-03-22 DIAGNOSIS — M7021 Olecranon bursitis, right elbow: Secondary | ICD-10-CM | POA: Diagnosis not present

## 2022-03-22 DIAGNOSIS — Z0000 Encounter for general adult medical examination without abnormal findings: Secondary | ICD-10-CM | POA: Diagnosis not present

## 2022-03-22 DIAGNOSIS — Z299 Encounter for prophylactic measures, unspecified: Secondary | ICD-10-CM | POA: Diagnosis not present

## 2022-03-22 DIAGNOSIS — E11319 Type 2 diabetes mellitus with unspecified diabetic retinopathy without macular edema: Secondary | ICD-10-CM | POA: Diagnosis not present

## 2022-03-22 DIAGNOSIS — J9611 Chronic respiratory failure with hypoxia: Secondary | ICD-10-CM | POA: Diagnosis not present

## 2022-03-22 DIAGNOSIS — E1165 Type 2 diabetes mellitus with hyperglycemia: Secondary | ICD-10-CM | POA: Diagnosis not present

## 2022-03-22 DIAGNOSIS — Z6827 Body mass index (BMI) 27.0-27.9, adult: Secondary | ICD-10-CM | POA: Diagnosis not present

## 2022-04-13 ENCOUNTER — Ambulatory Visit: Payer: Self-pay | Admitting: *Deleted

## 2022-04-29 NOTE — Progress Notes (Unsigned)
HISTORY AND PHYSICAL     CC:  follow up. Requesting Provider:  Monico Blitz, MD  HPI: This is a 89 y.o. male who is here today for follow up for PAD.  Pt has hx of right TMA by Dr. Trula Slade in November 2021.  He has hx of left TMA 11/12/2021 by [name].    Pt was last seen 02/07/2022 and at that time, he was not having any pain in his legs or feet and was ambulating around his house with a RW.  His balance was a little unsteady and had had a recent fall.  His wife was caring for the TMA and he still had a small scab present on the TMA.  It was recommended he continue to heel weight bear until eschar was off and foot healed.  He was given an rx for [employer] for orthotic for his shoe.  He was scheduled for 3 month follow up.  The pt returns today for follow up.  ***  The pt is on a statin for cholesterol management.    The pt is not on an aspirin.    Other AC:  Plavix, Eliquis The pt is on diuretic for hypertension.  The pt does  have diabetes. Tobacco hx:  former    Past Medical History:  Diagnosis Date   Anemia    a. mild, noted 04/2017.   CAD in native artery    a. [name] 04/2017 s/p DES to D1, DES to prox-mid LAD, DES to prox LAD overlapping the prior stent, LVEF 55-65%.    Chronic diastolic CHF (congestive heart failure) (HCC)    Constipation    COPD (chronic obstructive pulmonary disease) (HCC)    Diabetic ulcer of toe (HCC)    DJD (degenerative joint disease) of cervical spine    Dysrhythmia    AFib   Essential hypertension    GERD (gastroesophageal reflux disease)    History of hiatal hernia    HIT (heparin-induced thrombocytopenia) ([place])    Hypothyroidism    Hypoxia    a. went home on home O2 04/2017.   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   Retinitis    Sinus bradycardia     a. HR 30s-40s in 04/2017 -> diltiazem stopped, metoprolol reduced.   Sleep apnea    "chose not to order CPAP at this time" (05/18/2017)   Type 2 diabetes mellitus ([place])    Wears glasses    Wheezing    a. suspected COPD 04/2017. Former tobacco x 40 years.    Past Surgical History:  Procedure Laterality Date   ABDOMINAL AORTOGRAM W/LOWER EXTREMITY N/A 09/15/2017   Procedure: ABDOMINAL AORTOGRAM W/LOWER EXTREMITY;  Surgeon: Serafina Mitchell, MD;  Location: [place] [facility];  Service: Cardiovascular;  Laterality: N/A;   ABDOMINAL AORTOGRAM W/LOWER EXTREMITY N/A 02/04/2020   Procedure: ABDOMINAL AORTOGRAM W/LOWER EXTREMITY;  Surgeon: Serafina Mitchell, MD;  Location: [place] [facility];  Service: Cardiovascular;  Laterality: N/A;   ABDOMINAL AORTOGRAM W/LOWER EXTREMITY N/A 12/15/2020   Procedure: ABDOMINAL AORTOGRAM W/LOWER EXTREMITY;  Surgeon: Serafina Mitchell, MD;  Location: [place] [facility];  Service: Cardiovascular;  Laterality: N/A;   AMPUTATION Bilateral 09/20/2017   Procedure: BILATERAL GREAT TOE AMPUTATIONS INCLUDING METATARSAL HEADS;  Surgeon: Trula Slade,  Butch Penny, MD;  Location: [place];  Service: Vascular;  Laterality: Bilateral;   AMPUTATION Right 03/01/2019   Procedure: AMPUTATION TOES;  Surgeon: Angelia Mould, MD;  Location: [place];  Service: Vascular;  Laterality: Right;   AMPUTATION Right 05/29/2019   Procedure: AMPUTATION RIGHT THIRD TOE;  Surgeon: Serafina Mitchell, MD;  Location: [place];  Service: Vascular;  Laterality: Right;   AMPUTATION Right 09/26/2019   Procedure: AMPUTATION RIGHT FOURTH TOE AND FIFTH TOES;  Surgeon: Serafina Mitchell, MD;  Location: [place];  Service: Vascular;  Laterality: Right;   ANGIOPLASTY Right 09/20/2017   Procedure: ANGIOPLASTY RIGHT TIBIAL ARTERY;  Surgeon: Serafina Mitchell, MD;  Location: [place];  Service: Vascular;  Laterality: Right;   APPENDECTOMY     APPLICATION OF WOUND VAC  12/17/2020   Procedure: APPLICATION OF WOUND VAC;  Surgeon: Serafina Mitchell,  MD;  Location: [place];  Service: Vascular;;   [place]; 2012;   CATARACT EXTRACTION Left 2004   COLONOSCOPY  2004   FLEISHMAN TICS   COLONOSCOPY WITH PROPOFOL N/A 11/28/2018   Procedure: COLONOSCOPY WITH PROPOFOL;  Surgeon: Rogene Houston, MD;  [facility];  Service: Endoscopy;  Laterality: N/A;   CORONARY ANGIOPLASTY WITH STENT PLACEMENT  05/18/2017   "3 stents"   CORONARY STENT INTERVENTION N/A 05/18/2017   Procedure: CORONARY STENT INTERVENTION;  Surgeon: Jettie Booze, MD;  Location: [place] [facility];  Service: Cardiovascular;  Laterality: N/A;   ESOPHAGOGASTRODUODENOSCOPY (EGD) WITH PROPOFOL N/A 11/20/2017   Procedure: ESOPHAGOGASTRODUODENOSCOPY (EGD) WITH PROPOFOL;  Surgeon: Irene Shipper, MD;  Location: [place];  Service: Gastroenterology;  Laterality: N/A;   ESOPHAGOGASTRODUODENOSCOPY (EGD) WITH PROPOFOL N/A 11/28/2018   Procedure: ESOPHAGOGASTRODUODENOSCOPY (EGD) WITH PROPOFOL;  Surgeon: Rogene Houston, MD;  [facility];  Service: Endoscopy;  Laterality: N/A;   ESOPHAGOGASTRODUODENOSCOPY (EGD) WITH PROPOFOL N/A 07/12/2019   Procedure: ESOPHAGOGASTRODUODENOSCOPY (EGD) WITH PROPOFOL;  Surgeon: Rogene Houston, MD;  [facility];  Service: Endoscopy;  Laterality: N/A;  [place] N/A 12/17/2018   Procedure: GIVENS CAPSULE STUDY;  Surgeon: Rogene Houston, MD;  [facility];  Service: Endoscopy;  Laterality: N/A;  7:30am   I & D EXTREMITY Right 12/17/2017   Procedure: IRRIGATION AND DEBRIDEMENT RIGHT GREAT TOE;  Surgeon: Serafina Mitchell, MD;  [facility];  Service: Vascular;  Laterality: Right;   INCISION AND DRAINAGE Right 12/17/2020   Procedure: INCISION AND DRAINAGE OF RIGHT FOOT;  Surgeon: Serafina Mitchell, MD;  [facility];  Service: Vascular;  Laterality: Right;   JOINT REPLACEMENT     LEFT HEART CATH AND CORONARY ANGIOGRAPHY N/A 05/18/2017   Procedure: LEFT HEART  CATH AND CORONARY ANGIOGRAPHY;  Surgeon: Jettie Booze, MD;  Location: [place] [facility];  Service: Cardiovascular;  Laterality: N/A;   LOWER EXTREMITY ANGIOGRAM Right 09/20/2017   Procedure: RIGHT LOWER LEG  ANGIOGRAM;  Surgeon: Serafina Mitchell, MD;  [facility];  Service: Vascular;  Laterality: Right;   LUMBAR FUSION  2002   L3, 4 L4, 5 L5 S1 Fused by Dr. Glenna Fellows   PERIPHERAL VASCULAR BALLOON ANGIOPLASTY Left 09/15/2017   Procedure: [place];  Surgeon: Serafina Mitchell, MD;  Location: [place] [facility];  Service: Cardiovascular;  Laterality: Left;  PTA of Peroneal & Posterior Tibial   PERIPHERAL VASCULAR INTERVENTION Right 12/15/2020   Procedure:  PERIPHERAL VASCULAR INTERVENTION;  Surgeon: Serafina Mitchell, MD;  Location: [place] [facility];  Service: Cardiovascular;  Laterality: Right;  SFA   POLYPECTOMY  11/28/2018   Procedure: POLYPECTOMY;  Surgeon: Rogene Houston, MD;  [facility];  Service: Endoscopy;;  duodenum   POSTERIOR LUMBAR FUSION     RHINOPLASTY     RIGHT HEART CATH N/A 05/18/2017   Procedure: RIGHT HEART CATH;  Surgeon: Jettie Booze, MD;  Location: [place] [facility];  Service: Cardiovascular;  Laterality: N/A;   TEE WITHOUT CARDIOVERSION N/A 09/18/2017   Procedure: TRANSESOPHAGEAL ECHOCARDIOGRAM (TEE);  Surgeon: Fay Records, MD;  [facility];  Service: Cardiovascular;  Laterality: N/A;   TOTAL KNEE ARTHROPLASTY Bilateral    TRANSMETATARSAL AMPUTATION Right 02/04/2020   Procedure: RIGHT TRANSMETATARSAL AMPUTATION;  Surgeon: Serafina Mitchell, MD;  Location: [place];  Service: Vascular;  Laterality: Right;   TRANSMETATARSAL AMPUTATION Left 11/12/2021   Procedure: LEFT TRANSMETATARSAL AMPUTATION;  Surgeon: Waynetta Sandy, MD;  Location: [place];  Service: Vascular;  Laterality: Left;   TRANSURETHRAL RESECTION OF PROSTATE  2001   [place]    Allergies  Allergen Reactions   Codeine Shortness Of  Breath   Doxycycline Swelling    Swelling and numbness in lips and face. Swelling improved after stopping. Reports still experiences numbness in bottom lip.    Feraheme [Ferumoxytol] Other (See Comments)    Diaphoretic, chest pain   Heparin Other (See Comments)    +HIT,  Severe bleeding (with heparin drip & large doses), tolerates low doses   Iron Shortness Of Breath   Losartan Swelling   Oxycodone Other (See Comments)    "Made me act out of my mind" Mental status changes- hallucinations   Latex Rash    Current Outpatient Medications  Medication Sig Dispense Refill   Accu-Chek Softclix Lancets lancets      acetaminophen (TYLENOL) 500 MG tablet Take 1 tablet (500 mg total) by mouth every 6 (six) hours as needed for mild pain or headache. (Patient taking differently: Take 1,000 mg by mouth every 6 (six) hours as needed for mild pain or headache.) 30 tablet 0   Alcohol Swabs (B-D SINGLE USE SWABS REGULAR) PADS      apixaban (ELIQUIS) 2.5 MG TABS tablet Take 1 tablet (2.5 mg total) by mouth 2 (two) times daily. (Patient taking differently: Take 5 mg by mouth 2 (two) times daily.) 60 tablet 11   atorvastatin (LIPITOR) 80 MG tablet Take 80 mg by mouth every evening.      Blood Glucose Monitoring Suppl (ACCU-CHEK GUIDE) w/Device KIT      Cholecalciferol (VITAMIN D3) 50 MCG (2000 UT) TABS Take 2,000 Units by mouth daily.     clopidogrel (PLAVIX) 75 MG tablet Take 1 tablet (75 mg total) by mouth daily with breakfast. 30 tablet 2   Cyanocobalamin (B-12) 2500 MCG TABS Take 2,500 mcg by mouth daily.     DROPLET INSULIN SYRINGE 31G X 5/16" 1 ML MISC      folic acid (FOLVITE) 1 MG tablet Take 1 tablet (1 mg total) by mouth daily. 30 tablet 1   furosemide (LASIX) 40 MG tablet TAKE 1 TABLET EVERY DAY AS NEEDED FOR EDEMA (SWELLING) 90 tablet 1   insulin glargine (LANTUS) 100 UNIT/ML injection Inject 30 Units into the skin 2 (two) times daily.     ipratropium-albuterol (DUONEB) 0.5-2.5 (3) MG/3ML SOLN  Take 3 mLs by nebulization every other day.     isosorbide mononitrate (  IMDUR) 60 MG 24 hr tablet Take 1 tablet (60 mg total) by mouth 2 (two) times daily. 180 tablet 3   nitroGLYCERIN (NITROSTAT) 0.4 MG SL tablet PLACE 1 TABLET (0.4 MG TOTAL) UNDER THE TONGUE EVERY 5  MINUTES AS NEEDED FOR CHEST PAIN. (Patient taking differently: Place 0.4 mg under the tongue every 5 (five) minutes as needed for chest pain.) 25 tablet 3   nystatin (MYCOSTATIN) 100000 UNIT/ML suspension Take by mouth. (Patient not taking: Reported on 02/07/2022)     OXYGEN Inhale 2 L/min into the lungs See admin instructions. 2 L/min of oxygen at bedtime and during the day as needed for shortness of breath     pantoprazole (PROTONIX) 40 MG tablet Take 1 tablet (40 mg total) by mouth 2 (two) times daily. To protect stomach while taking multiple blood thinners. (Patient taking differently: Take 40 mg by mouth daily. To protect stomach while taking multiple blood thinners.) 60 tablet 2   potassium chloride SA (K-DUR) 20 MEQ tablet Take 1 tablet (20 mEq total) by mouth daily. 30 tablet 1   SPIRIVA HANDIHALER 18 MCG inhalation capsule Place 1 capsule into inhaler and inhale daily after breakfast.      traZODone (DESYREL) 100 MG tablet Take 150 mg by mouth at bedtime.     TRUE METRIX BLOOD GLUCOSE TEST test strip      No current facility-administered medications for this visit.    Family History  Problem Relation Age of Onset   Heart attack Father 15   COPD Father    COPD Mother    Heart disease Mother    Diabetes Mother    Hypertension Sister    CVA Sister    Diabetes Sister    Multiple sclerosis Sister     Social History   Socioeconomic History   Marital status: Married    Spouse name: patsy   Number of children: Not on file   Years of education: Not on file   Highest education level: Not on file  Occupational History   Not on file  Tobacco Use   Smoking status: Former    Packs/day: 2.00    Years: 30.00    Total  pack years: 60.00    Types: Cigarettes    Start date: 03/28/1953    Quit date: 03/29/1983    Years since quitting: 39.1   Smokeless tobacco: Never  Vaping Use   Vaping Use: Never used  Substance and Sexual Activity   Alcohol use: Not Currently    Comment: 05/18/2017 'I drink a beer q yr"   Drug use: No   Sexual activity: Not Currently  Other Topics Concern   Not on file  Social History Narrative   Patient lives at home with his spouse.    Social Determinants of Health   Financial Resource Strain: Low Risk  (02/11/2022)   Overall Financial Resource Strain (CARDIA)    Difficulty of Paying Living Expenses: Not hard at all  Food Insecurity: No Food Insecurity (10/08/2020)   Hunger Vital Sign    Worried About Running Out of Food in the Last Year: Never true    Ran Out of Food in the Last Year: Never true  Transportation Needs: No Transportation Needs (02/11/2022)   PRAPARE - Hydrologist (Medical): No    Lack of Transportation (Non-Medical): No  Physical Activity: Not on file  Stress: No Stress Concern Present (07/29/2019)   [place]  Feeling of Stress : Not at all  Social Connections: Not on file  Intimate Partner Violence: Not on file     REVIEW OF SYSTEMS:  *** '[X]'$  denotes positive finding, '[ ]'$  denotes negative finding Cardiac  Comments:  Chest pain or chest pressure:    Shortness of breath upon exertion:    Short of breath when lying flat:    Irregular heart rhythm:        Vascular    Pain in calf, thigh, or hip brought on by ambulation:    Pain in feet at night that wakes you up from your sleep:     Blood clot in your veins:    Leg swelling:         Pulmonary    Oxygen at home:    Productive cough:     Wheezing:         Neurologic    Sudden weakness in arms or legs:     Sudden numbness in arms or legs:     Sudden onset of difficulty speaking or slurred speech:     Temporary loss of vision in one eye:     Problems with dizziness:         Gastrointestinal    Blood in stool:     Vomited blood:         Genitourinary    Burning when urinating:     Blood in urine:        Psychiatric    Major depression:         Hematologic    Bleeding problems:    Problems with blood clotting too easily:        Skin    Rashes or ulcers:        Constitutional    Fever or chills:      PHYSICAL EXAMINATION:  ***  General:  WDWN in NAD; vital signs documented above Gait: Not observed HENT: WNL, normocephalic Pulmonary: normal non-labored breathing , without wheezing Cardiac: {Desc; regular/irreg:14544} HR, {With/Without:20273} carotid bruit*** Abdomen: soft, NT; aortic pulse is *** palpable Skin: {With/Without:20273} rashes Vascular Exam/Pulses:  Right Left  Radial {Exam; arterial pulse strength 0-4:30167} {Exam; arterial pulse strength 0-4:30167}  Femoral {Exam; arterial pulse strength 0-4:30167} {Exam; arterial pulse strength 0-4:30167}  Popliteal {Exam; arterial pulse strength 0-4:30167} {Exam; arterial pulse strength 0-4:30167}  DP {Exam; arterial pulse strength 0-4:30167} {Exam; arterial pulse strength 0-4:30167}  PT {Exam; arterial pulse strength 0-4:30167} {Exam; arterial pulse strength 0-4:30167}  Peroneal *** ***   Extremities: {With/Without:20273} ischemic changes, {With/Without:20273} Gangrene , {With/Without:20273} cellulitis; {With/Without:20273} open wounds Musculoskeletal: no muscle wasting or atrophy  Neurologic: A&O X 3 Psychiatric:  The pt has {Desc; normal/abnormal:11317::"Normal"} affect.   Non-Invasive Vascular Imaging:   ABI's/TBI's on 05/02/2022: Right:  *** - Great toe pressure: *** Left:  *** - Great toe pressure: ***  Arterial duplex on ***: ***  Previous ABI's/TBI's on 11/11/2021: Right:  1.07/amp - Great toe pressure: amp Left:  1.01/amp - Great toe pressure:  amp  Previous arterial duplex on  11/11/2021: +----------+--------+-----+--------+----------+--------+  LEFT     PSV cm/sRatioStenosisWaveform  Comments  +----------+--------+-----+--------+----------+--------+  CIA Distal                     triphasic           +----------+--------+-----+--------+----------+--------+  CFA Distal243                  triphasic           +----------+--------+-----+--------+----------+--------+  DFA      114                  triphasic           +----------+--------+-----+--------+----------+--------+  SFA Prox  209                  triphasic           +----------+--------+-----+--------+----------+--------+  SFA Mid   128                  triphasic           +----------+--------+-----+--------+----------+--------+  SFA Distal111                  triphasic           +----------+--------+-----+--------+----------+--------+  POP Prox  97                   triphasic           +----------+--------+-----+--------+----------+--------+  POP Distal74                   triphasic           +----------+--------+-----+--------+----------+--------+  TP Trunk  108                  triphasic           +----------+--------+-----+--------+----------+--------+  ATA Prox  96                   biphasic            +----------+--------+-----+--------+----------+--------+  ATA Mid   89                   biphasic            +----------+--------+-----+--------+----------+--------+  ATA Distal41                   biphasic            +----------+--------+-----+--------+----------+--------+  PTA Prox  111                  biphasic            +----------+--------+-----+--------+----------+--------+  PTA Mid   193                  biphasic            +----------+--------+-----+--------+----------+--------+  PTA Distal120                  biphasic             +----------+--------+-----+--------+----------+--------+  DP       43                   monophasic          +----------+--------+-----+--------+----------+--------+   Summary:  Left: No hemodynamically significant stenoses noted.     ASSESSMENT/PLAN:: 89 y.o. male here for follow up for PAD with hx of  hx of right TMA by Dr. Trula Slade in November 2021.  He has hx of left TMA 11/12/2021 by [name].     -*** -continue statin, plavix, eliquis -pt will f/u in *** with ***.   Leontine Locket, [facility] [phone]  Clinic MD:   Trula Slade

## 2022-05-02 ENCOUNTER — Encounter: Payer: Self-pay | Admitting: Physician Assistant

## 2022-05-02 ENCOUNTER — Ambulatory Visit (INDEPENDENT_AMBULATORY_CARE_PROVIDER_SITE_OTHER): Payer: Medicare HMO | Admitting: Physician Assistant

## 2022-05-02 ENCOUNTER — Ambulatory Visit (HOSPITAL_COMMUNITY)
Admission: RE | Admit: 2022-05-02 | Discharge: 2022-05-02 | Disposition: A | Discharge status: Home / Self Care | Payer: Medicare HMO | Source: Ambulatory Visit | Attending: Surgery | Admitting: Surgery

## 2022-05-02 VITALS — BP 150/66 | HR 65 | Temp 98.7°F | Resp 20 | Ht 67.0 in | Wt 185.1 lb

## 2022-05-02 DIAGNOSIS — I739 Peripheral vascular disease, unspecified: Secondary | ICD-10-CM

## 2022-05-02 LAB — VAS US ABI WITH/WO TBI
Left ABI: 0.95
Right ABI: 0.97

## 2022-05-03 ENCOUNTER — Other Ambulatory Visit: Payer: Self-pay

## 2022-05-03 DIAGNOSIS — I739 Peripheral vascular disease, unspecified: Secondary | ICD-10-CM

## 2022-05-04 ENCOUNTER — Other Ambulatory Visit: Payer: Self-pay

## 2022-05-04 ENCOUNTER — Telehealth: Payer: Self-pay | Admitting: *Deleted

## 2022-05-04 DIAGNOSIS — I739 Peripheral vascular disease, unspecified: Secondary | ICD-10-CM

## 2022-05-04 DIAGNOSIS — I7025 Atherosclerosis of native arteries of other extremities with ulceration: Secondary | ICD-10-CM

## 2022-05-04 NOTE — Progress Notes (Signed)
  Care Coordination Note  05/04/2022 Name: MATHHEW BUYSSE MRN: [id number] DOB: [birth year]  Mike Wright is a 89 y.o. year old male who is a primary care patient of Monico Blitz, MD and is actively engaged with the care management team. I reached out to [name] by phone today to assist with re-scheduling a follow up visit with the RN Case Manager  Follow up plan: Unsuccessful telephone outreach attempt made.   [place]  Direct Dial: [phone]

## 2022-05-09 NOTE — Progress Notes (Signed)
  Care Coordination Note  05/09/2022 Name: Mike Wright MRN: [id number] DOB: 10-14-1933  Mike Wright is a 89 y.o. year old male who is a primary care patient of Monico Blitz, MD and is actively engaged with the care management team. I reached out to [name] by phone today to assist with re-scheduling a follow up visit with the RN Case Manager  Follow up plan: Telephone appointment with care management team member scheduled for:05/23/22  [place]  Direct Dial: [phone]

## 2022-05-16 ENCOUNTER — Ambulatory Visit: Payer: Medicare HMO | Attending: Cardiology | Admitting: Cardiology

## 2022-05-16 ENCOUNTER — Encounter: Payer: Self-pay | Admitting: Cardiology

## 2022-05-16 VITALS — BP 140/60 | HR 49 | Ht 67.0 in | Wt 184.4 lb

## 2022-05-16 DIAGNOSIS — Z79899 Other long term (current) drug therapy: Secondary | ICD-10-CM | POA: Diagnosis not present

## 2022-05-16 DIAGNOSIS — D6869 Other thrombophilia: Secondary | ICD-10-CM

## 2022-05-16 DIAGNOSIS — I25118 Atherosclerotic heart disease of native coronary artery with other forms of angina pectoris: Secondary | ICD-10-CM | POA: Diagnosis not present

## 2022-05-16 DIAGNOSIS — I10 Essential (primary) hypertension: Secondary | ICD-10-CM

## 2022-05-16 DIAGNOSIS — I5032 Chronic diastolic (congestive) heart failure: Secondary | ICD-10-CM | POA: Diagnosis not present

## 2022-05-16 DIAGNOSIS — Z89422 Acquired absence of other left toe(s): Secondary | ICD-10-CM | POA: Diagnosis not present

## 2022-05-16 DIAGNOSIS — Z89421 Acquired absence of other right toe(s): Secondary | ICD-10-CM | POA: Diagnosis not present

## 2022-05-16 DIAGNOSIS — Z89411 Acquired absence of right great toe: Secondary | ICD-10-CM | POA: Diagnosis not present

## 2022-05-16 DIAGNOSIS — Z89412 Acquired absence of left great toe: Secondary | ICD-10-CM | POA: Diagnosis not present

## 2022-05-16 DIAGNOSIS — I4891 Unspecified atrial fibrillation: Secondary | ICD-10-CM | POA: Diagnosis not present

## 2022-05-16 MED ORDER — AMLODIPINE BESYLATE 2.5 MG PO TABS: 2.5000 mg | ORAL_TABLET | Freq: Every day | ORAL | 1 refills | Status: DC

## 2022-05-16 MED ORDER — FUROSEMIDE 40 MG PO TABS: 40.0000 mg | ORAL_TABLET | Freq: Every day | ORAL | 1 refills | Status: DC

## 2022-05-16 NOTE — Patient Instructions (Signed)
Medication Instructions:  Begin Norvasc 2.65m daily  Increase your Lasix to 459mdaily  Continue all other medications.     Labwork: BMET, Mg - 1 week Office will contact with results via phone, letter or mychart.     Testing/Procedures: none  Follow-Up: 3 months   Any Other Special Instructions Will Be Listed Below (If Applicable).   If you need a refill on your cardiac medications before your next appointment, please call your pharmacy.

## 2022-05-16 NOTE — Progress Notes (Signed)
Clinical Summary Mike Wright is a 89 y.o.male seen today for focused visit on recent issues with diastolic HF.      1. CAD with stable angina - nonobstructive CAD by cath Jan 2012, LVEF 60-65% by LV gram.  - 06/2015 nuclear stress without clear ischemia - 05/2015 echo LVEF 65-70%, no WMAs, cannot evaluate diastolic function  [id number] lexiscan without ischemia, low risk study.    - cath 04/2017 as reported below. Received DES to 75% D1, DES to 75% mid to distal LAD, DES to 70% prox LAD RHC with CI 2.67, mean PA 25, no wedge reported by LVEDP 14.  - discharged on triple therapy with ASA, plavix, eliquis with plan for 30 days, then stop ASA.     - 11/22/17 admission with chest pain, anemia. Negative workup for ACS. Symptoms resolved with blood transfusion - 11/2017 admit with chest pain and dyspnea. ACS work negative, CT PE negative. Diuresed 6L with resolustion of symptoms. CXR with rib fracture thought playing a role in chest pain.     01/2020 nuclear stress: Small, mild intensity, mid to basal inferior/inferoseptal defect that is fixed and consistent with soft tissue attenuation in light of normal wall motion. No significant ischemic territories.       - as been on Eliquis and plavix due to high stent burden   - pressure like left sided. Can occur at rest or with activity, 4-5/10 in severity. Can have some SOB. Not positional. Takes NG and symptoms improved. Occurs about once 1-2 weeks.   - mild infrequent chest pains, can have some SOB. -1-2 times per month - mild headaches at times, unclear if related to imdur.     2. Chronic diastolic HF - recent admission 11/2018 with acute on chronic diastolic HF - no recent SOB/DOE. No recent edema. Takes lasix 29m bid - home weights down to 184 lbs.  Hospital discharge weight 191 lbs       - taking lasix 410mprn, about 5 days week.  - had prior dose decrease during 10/2021 admission with AKI in setting of gangrene foot infection.       3. Afib - admission 11/2018 with issues with afib - from notes had afib with slow rates, toprol was stopped   - no recent palpitatons   4.History of GI bleeding - GI bleed during 11/2018, stopped eliquis at that time, later restarted - heme +positive stools, negative endoscopy with plans for outpatient pill endoscopy    - dark stool 3 weeks ago that has resolved.    5. PAD - followed by vascular - admission 08/2017 with ischemia great toes. Had intervention on lower extremity vessels at that time.  -  ultimately required ampuation bilateral great toes   - admit 11/2017 for poor healing right great toe amputation, critical limb ischemia - admit 02/2019 with gangrene right second toe with nonhealing diabetic foot wound. S/p amputations - additional toe amputation 05/2019   - had repeat partial amputation 01/2020 - continues to follow with vascular, last appt 05/02/22     6. PSVT/Palpitations.  - no recent symptoms.    7. Chronic anemia - has received multiple transfusions - followed by closely.    8. Hyperlipidemia - 09/2021 TC 71 TG 74 HDL 29 LDL 26 - he is on atorvastatin 8073maily.   9. Aortic stenosis - 10/2021 echo LVEF 70-75, mod AS Past Medical History:  Diagnosis Date   Anemia    a. mild, noted 04/2017.  CAD in native artery    a. [name] 04/2017 s/p DES to D1, DES to prox-mid LAD, DES to prox LAD overlapping the prior stent, LVEF 55-65%.    Chronic diastolic CHF (congestive heart failure) (HCC)    Constipation    COPD (chronic obstructive pulmonary disease) (HCC)    Diabetic ulcer of toe (HCC)    DJD (degenerative joint disease) of cervical spine    Dysrhythmia    AFib   Essential hypertension    GERD (gastroesophageal reflux disease)    History of hiatal hernia    HIT (heparin-induced thrombocytopenia) ([place])    Hypothyroidism    Hypoxia    a. went home on home O2 04/2017.   Insomnia    Mixed hyperlipidemia    PAD (peripheral artery disease) (HCC)    PAF  (paroxysmal atrial fibrillation) (HCC)    PVD (peripheral vascular disease) (HCC)    Renal insufficiency    Retinal hemorrhage    lost 90% of vision.   Retinitis    Sinus bradycardia    a. HR 30s-40s in 04/2017 -> diltiazem stopped, metoprolol reduced.   Sleep apnea    "chose not to order CPAP at this time" (05/18/2017)   Type 2 diabetes mellitus ([place])    Wears glasses    Wheezing    a. suspected COPD 04/2017. Former tobacco x 40 years.     Allergies  Allergen Reactions   Codeine Shortness Of Breath   Doxycycline Swelling    Swelling and numbness in lips and face. Swelling improved after stopping. Reports still experiences numbness in bottom lip.    Feraheme [Ferumoxytol] Other (See Comments)    Diaphoretic, chest pain   Heparin Other (See Comments)    +HIT,  Severe bleeding (with heparin drip & large doses), tolerates low doses   Iron Shortness Of Breath   Losartan Swelling   Oxycodone Other (See Comments)    "Made me act out of my mind" Mental status changes- hallucinations   Latex Rash     Current Outpatient Medications  Medication Sig Dispense Refill   Accu-Chek Softclix Lancets lancets      acetaminophen (TYLENOL) 500 MG tablet Take 1 tablet (500 mg total) by mouth every 6 (six) hours as needed for mild pain or headache. (Patient taking differently: Take 1,000 mg by mouth every 6 (six) hours as needed for mild pain or headache.) 30 tablet 0   Alcohol Swabs (B-D SINGLE USE SWABS REGULAR) PADS      apixaban (ELIQUIS) 2.5 MG TABS tablet Take 1 tablet (2.5 mg total) by mouth 2 (two) times daily. (Patient taking differently: Take 5 mg by mouth 2 (two) times daily.) 60 tablet 11   atorvastatin (LIPITOR) 80 MG tablet Take 80 mg by mouth every evening.      Blood Glucose Monitoring Suppl (ACCU-CHEK GUIDE) w/Device KIT      Cholecalciferol (VITAMIN D3) 50 MCG (2000 UT) TABS Take 2,000 Units by mouth daily.     clopidogrel (PLAVIX) 75 MG tablet Take 1 tablet (75 mg total) by mouth  daily with breakfast. 30 tablet 2   Cyanocobalamin (B-12) 2500 MCG TABS Take 2,500 mcg by mouth daily.     DROPLET INSULIN SYRINGE 31G X 5/16" 1 ML MISC      folic acid (FOLVITE) 1 MG tablet Take 1 tablet (1 mg total) by mouth daily. 30 tablet 1   furosemide (LASIX) 40 MG tablet TAKE 1 TABLET EVERY DAY AS NEEDED FOR EDEMA (SWELLING) 90 tablet 1  insulin glargine (LANTUS) 100 UNIT/ML injection Inject 30 Units into the skin 2 (two) times daily.     ipratropium-albuterol (DUONEB) 0.5-2.5 (3) MG/3ML SOLN Take 3 mLs by nebulization every other day.     isosorbide mononitrate (IMDUR) 60 MG 24 hr tablet Take 1 tablet (60 mg total) by mouth 2 (two) times daily. 180 tablet 3   nitroGLYCERIN (NITROSTAT) 0.4 MG SL tablet PLACE 1 TABLET (0.4 MG TOTAL) UNDER THE TONGUE EVERY 5  MINUTES AS NEEDED FOR CHEST PAIN. (Patient taking differently: Place 0.4 mg under the tongue every 5 (five) minutes as needed for chest pain.) 25 tablet 3   nystatin (MYCOSTATIN) 100000 UNIT/ML suspension Take by mouth.     OXYGEN Inhale 2 L/min into the lungs See admin instructions. 2 L/min of oxygen at bedtime and during the day as needed for shortness of breath     pantoprazole (PROTONIX) 40 MG tablet Take 1 tablet (40 mg total) by mouth 2 (two) times daily. To protect stomach while taking multiple blood thinners. (Patient taking differently: Take 40 mg by mouth daily. To protect stomach while taking multiple blood thinners.) 60 tablet 2   potassium chloride SA (K-DUR) 20 MEQ tablet Take 1 tablet (20 mEq total) by mouth daily. 30 tablet 1   SPIRIVA HANDIHALER 18 MCG inhalation capsule Place 1 capsule into inhaler and inhale daily after breakfast.      traZODone (DESYREL) 100 MG tablet Take 150 mg by mouth at bedtime.     TRUE METRIX BLOOD GLUCOSE TEST test strip      No current facility-administered medications for this visit.     Past Surgical History:  Procedure Laterality Date   ABDOMINAL AORTOGRAM W/LOWER EXTREMITY N/A  09/15/2017   Procedure: ABDOMINAL AORTOGRAM W/LOWER EXTREMITY;  Surgeon: Serafina Mitchell, MD;  Location: [facility];  Service: Cardiovascular;  Laterality: N/A;   ABDOMINAL AORTOGRAM W/LOWER EXTREMITY N/A 02/04/2020   Procedure: ABDOMINAL AORTOGRAM W/LOWER EXTREMITY;  Surgeon: Serafina Mitchell, MD;  Location: [place] [facility];  Service: Cardiovascular;  Laterality: N/A;   ABDOMINAL AORTOGRAM W/LOWER EXTREMITY N/A 12/15/2020   Procedure: ABDOMINAL AORTOGRAM W/LOWER EXTREMITY;  Surgeon: Serafina Mitchell, MD;  Location: [facility] [facility];  Service: Cardiovascular;  Laterality: N/A;   AMPUTATION Bilateral 09/20/2017   Procedure: BILATERAL GREAT TOE AMPUTATIONS INCLUDING METATARSAL HEADS;  Surgeon: Serafina Mitchell, MD;  Location: [place];  Service: Vascular;  Laterality: Bilateral;   AMPUTATION Right 03/01/2019   Procedure: AMPUTATION TOES;  Surgeon: Angelia Mould, MD;  Location: [place];  Service: Vascular;  Laterality: Right;   AMPUTATION Right 05/29/2019   Procedure: AMPUTATION RIGHT THIRD TOE;  Surgeon: Serafina Mitchell, MD;  Location: [place];  Service: Vascular;  Laterality: Right;   AMPUTATION Right 09/26/2019   Procedure: AMPUTATION RIGHT FOURTH TOE AND FIFTH TOES;  Surgeon: Serafina Mitchell, MD;  Location: [place];  Service: Vascular;  Laterality: Right;   ANGIOPLASTY Right 09/20/2017   Procedure: ANGIOPLASTY RIGHT TIBIAL ARTERY;  Surgeon: Serafina Mitchell, MD;  Location: [place];  Service: Vascular;  Laterality: Right;   APPENDECTOMY     APPLICATION OF WOUND VAC  12/17/2020   Procedure: APPLICATION OF WOUND VAC;  Surgeon: Serafina Mitchell, MD;  Location: [facility];  Service: Vascular;;   [place]; 2012;   CATARACT EXTRACTION Left 2004   COLONOSCOPY  2004   FLEISHMAN TICS   COLONOSCOPY WITH PROPOFOL N/A 11/28/2018  Procedure: COLONOSCOPY WITH PROPOFOL;  Surgeon: Rogene Houston, MD;  [facility];  Service: Endoscopy;  Laterality: N/A;    CORONARY ANGIOPLASTY WITH STENT PLACEMENT  05/18/2017   "3 stents"   CORONARY STENT INTERVENTION N/A 05/18/2017   Procedure: CORONARY STENT INTERVENTION;  Surgeon: Jettie Booze, MD;  Location: [facility];  Service: Cardiovascular;  Laterality: N/A;   ESOPHAGOGASTRODUODENOSCOPY (EGD) WITH PROPOFOL N/A 11/20/2017   Procedure: ESOPHAGOGASTRODUODENOSCOPY (EGD) WITH PROPOFOL;  Surgeon: Irene Shipper, MD;  Location: [place];  Service: Gastroenterology;  Laterality: N/A;   ESOPHAGOGASTRODUODENOSCOPY (EGD) WITH PROPOFOL N/A 11/28/2018   Procedure: ESOPHAGOGASTRODUODENOSCOPY (EGD) WITH PROPOFOL;  Surgeon: Rogene Houston, MD;  [facility];  Service: Endoscopy;  Laterality: N/A;   ESOPHAGOGASTRODUODENOSCOPY (EGD) WITH PROPOFOL N/A 07/12/2019   Procedure: ESOPHAGOGASTRODUODENOSCOPY (EGD) WITH PROPOFOL;  Surgeon: Rogene Houston, MD;  [facility];  Service: Endoscopy;  Laterality: N/A;  [place] N/A 12/17/2018   Procedure: GIVENS CAPSULE STUDY;  Surgeon: Rogene Houston, MD;  [facility];  Service: Endoscopy;  Laterality: N/A;  7:30am   I & D EXTREMITY Right 12/17/2017   Procedure: IRRIGATION AND DEBRIDEMENT RIGHT GREAT TOE;  Surgeon: Serafina Mitchell, MD;  [facility];  Service: Vascular;  Laterality: Right;   INCISION AND DRAINAGE Right 12/17/2020   Procedure: INCISION AND DRAINAGE OF RIGHT FOOT;  Surgeon: Serafina Mitchell, MD;  [facility];  Service: Vascular;  Laterality: Right;   JOINT REPLACEMENT     LEFT HEART CATH AND CORONARY ANGIOGRAPHY N/A 05/18/2017   Procedure: LEFT HEART CATH AND CORONARY ANGIOGRAPHY;  Surgeon: Jettie Booze, MD;  Location: [facility];  Service: Cardiovascular;  Laterality: N/A;   LOWER EXTREMITY ANGIOGRAM Right 09/20/2017   Procedure: RIGHT LOWER LEG  ANGIOGRAM;  Surgeon: Serafina Mitchell, MD;  [facility];  Service: Vascular;  Laterality: Right;   LUMBAR FUSION  2002   L3, 4 L4, 5  L5 S1 Fused by Dr. Glenna Fellows   PERIPHERAL VASCULAR BALLOON ANGIOPLASTY Left 09/15/2017   Procedure: [place];  Surgeon: Serafina Mitchell, MD;  Location: [facility];  Service: Cardiovascular;  Laterality: Left;  PTA of Peroneal & Posterior Tibial   PERIPHERAL VASCULAR INTERVENTION Right 12/15/2020   Procedure: PERIPHERAL VASCULAR INTERVENTION;  Surgeon: Serafina Mitchell, MD;  Location: [facility];  Service: Cardiovascular;  Laterality: Right;  SFA   POLYPECTOMY  11/28/2018   Procedure: POLYPECTOMY;  Surgeon: Rogene Houston, MD;  [facility];  Service: Endoscopy;;  duodenum   POSTERIOR LUMBAR FUSION     RHINOPLASTY     RIGHT HEART CATH N/A 05/18/2017   Procedure: RIGHT HEART CATH;  Surgeon: Jettie Booze, MD;  Location: [place] [facility];  Service: Cardiovascular;  Laterality: N/A;   TEE WITHOUT CARDIOVERSION N/A 09/18/2017   Procedure: TRANSESOPHAGEAL ECHOCARDIOGRAM (TEE);  Surgeon: Fay Records, MD;  [facility];  Service: Cardiovascular;  Laterality: N/A;   TOTAL KNEE ARTHROPLASTY Bilateral    TRANSMETATARSAL AMPUTATION Right 02/04/2020   Procedure: RIGHT TRANSMETATARSAL AMPUTATION;  Surgeon: Serafina Mitchell, MD;  Location: [place];  Service: Vascular;  Laterality: Right;   TRANSMETATARSAL AMPUTATION Left 11/12/2021   Procedure: LEFT TRANSMETATARSAL AMPUTATION;  Surgeon: Waynetta Sandy, MD;  Location: [name];  Service: Vascular;  Laterality: Left;   TRANSURETHRAL RESECTION OF PROSTATE  2001   [place]  Allergies  Allergen Reactions   Codeine Shortness Of Breath   Doxycycline Swelling    Swelling and numbness in lips and face. Swelling improved after stopping. Reports still experiences numbness in bottom lip.    Feraheme [Ferumoxytol] Other (See Comments)    Diaphoretic, chest pain   Heparin Other (See Comments)    +HIT,  Severe bleeding (with heparin drip & large doses), tolerates low doses   Iron  Shortness Of Breath   Losartan Swelling   Oxycodone Other (See Comments)    "Made me act out of my mind" Mental status changes- hallucinations   Latex Rash      Family History  Problem Relation Age of Onset   Heart attack Father 66   COPD Father    COPD Mother    Heart disease Mother    Diabetes Mother    Hypertension Sister    CVA Sister    Diabetes Sister    Multiple sclerosis Sister      Social History Mike Wright reports that he quit smoking about 39 years ago. His smoking use included cigarettes. He started smoking about 69 years ago. He has a 60.00 pack-year smoking history. He has never used smokeless tobacco. Mike Wright reports that he does not currently use alcohol.   Review of Systems CONSTITUTIONAL: No weight loss, fever, chills, weakness or fatigue.  HEENT: Eyes: No visual loss, blurred vision, double vision or yellow sclerae.No hearing loss, sneezing, congestion, runny nose or sore throat.  SKIN: No rash or itching.  CARDIOVASCULAR: per hpi RESPIRATORY: No shortness of breath, cough or sputum.  GASTROINTESTINAL: No anorexia, nausea, vomiting or diarrhea. No abdominal pain or blood.  GENITOURINARY: No burning on urination, no polyuria NEUROLOGICAL: No headache, dizziness, syncope, paralysis, ataxia, numbness or tingling in the extremities. No change in bowel or bladder control.  MUSCULOSKELETAL: No muscle, back pain, joint pain or stiffness.  LYMPHATICS: No enlarged nodes. No history of splenectomy.  PSYCHIATRIC: No history of depression or anxiety.  ENDOCRINOLOGIC: No reports of sweating, cold or heat intolerance. No polyuria or polydipsia.  [name]   Physical Examination Today's Vitals   05/16/22 1355  BP: (!) 156/65  Pulse: (!) 49  SpO2: 94%  Weight: 184 lb 6.4 oz (83.6 kg)  Height: 5' 7"$  (1.702 m)   Body mass index is 28.88 kg/m.  Gen: resting comfortably, no acute distress HEENT: no scleral icterus, pupils equal round and reactive, no palptable  cervical adenopathy,  CV: irreg, 3/6 systoilc murmur rusb, no jvd Resp: faint crackles bilaterally GI: abdomen is soft, non-tender, non-distended, normal bowel sounds, no hepatosplenomegaly MSK: extremities are warm, 1+ bilateral LE edema Skin: warm, no rash Neuro:  no focal deficits Psych: appropriate affect   Diagnostic Studies  05/2015 echo Study Conclusions   - Left ventricle: The cavity size was normal. Wall thickness was   increased increased in a pattern of mild to moderate LVH.   Systolic function was vigorous. The estimated ejection fraction   was in the range of 65% to 70%. Wall motion was normal; there   were no regional wall motion abnormalities. The study is not   technically sufficient to allow evaluation of LV diastolic   function. - Aortic valve: Moderately calcified annulus. Mildly thickened   leaflets. There was mild stenosis. There was mild regurgitation.   Mean gradient (S): 13 mm Hg. Valve area (VTI): 1.51 cm^2. - Mitral valve: Mildly calcified annulus. Normal thickness leaflets   . - Left atrium: The atrium was moderately  dilated. - Technically adequate study.     06/2015 Nuclear stress test No diagnostic ST segment changes to indicate ischemia. Small, mild intensity, perfusion defects noted in the apical anterior and basal inferolateral walls. This is most consistent with soft tissue attenuation given normal wall motion in these regions. No large ischemic zones noted. This is a low risk study. Nuclear stress EF: 64%.   05/2016 Event monitor Rhythm is atrial fibrillation throughout study. Occasioanal PVCs Min HR 51, Max HR 107, Avg HR 67 Reported symptoms correspond with rate controlled atrial fibrillation.   09/2016 nuclear stress There was no ST segment deviation noted during stress. The study is normal. There are no perfusion defects consistent with prior infarct or current ischemia. This is a low risk study. The left ventricular ejection fraction is  normal (55-65%).     04/2017 cath Dist RCA lesion is 40% stenosed. Prox RCA lesion is 25% stenosed. 1st Diag lesion is 75% stenosed. A drug-eluting stent was successfully placed using a STENT SYNERGY DES 2.25X12. Post intervention, there is a 0% residual stenosis. Prox LAD to Mid LAD lesion is 75% stenosed. A drug-eluting stent was successfully placed using a STENT SYNERGY DES 3.5X38. Post intervention, there is a 0% residual stenosis. Prox LAD lesion is 70% stenosed. A drug-eluting stent was successfully placed using a STENT SYNERGY DES 4X12, overlapping the prior stent. Post intervention, there is a 0% residual stenosis. The left ventricular systolic function is normal. The left ventricular ejection fraction is 55-65% by visual estimate. LV end diastolic pressure is normal. There is no aortic valve stenosis. Ao sat 98%, PA sat 65%; PA mean 25 mm Hg; unable to wedge the catheter. Normal right heat pressures. Tortuous right subclavian making catheter navigation difficult from the right radial approach.   Continue dual antiplatelet therapy along with Eliquis for 30 days.   Restart Eliquis tomorrow.   After 30 days, stop aspirin.  Continue Plavix and Eliquis.     After 6 months, can consider stopping clopidogrel if he has bleeding issues.  Given the nomber of stents, would try to complete one year of clopidogrel if no bleeding issues.    01/2020 nuclear stress Small, mild intensity, mid to basal inferior/inferoseptal defect that is fixed and consistent with soft tissue attenuation in light of normal wall motion. No significant ischemic territories. This is a low risk study. Nuclear stress EF: 63%.   Assessment and Plan   1.CAD - chronic chest pains at times, will add norvasc 2.32m daily.  - recent stress test without significant ischemia - we had continued prolonged eliquis and plavix due to stent burden.    2. Chronic diastolic HF -volume overloaded, chance lasix from prn  to 42mdaily - check bmet/mg 1 week   3. Afib/acquired thrombophliia - off av nodal agents due to bradycardia. - no recent symptoms, continue current meds  4. Moderate aortic stenosis - repeat echo later this year summertime  5. HTN - above goal, start norvasc 2.65m1maily.     JonArnoldo Lenis.D.

## 2022-05-18 ENCOUNTER — Encounter: Payer: Medicare HMO | Admitting: *Deleted

## 2022-05-23 ENCOUNTER — Ambulatory Visit: Payer: Self-pay | Admitting: *Deleted

## 2022-05-23 ENCOUNTER — Encounter: Payer: Self-pay | Admitting: *Deleted

## 2022-05-23 NOTE — Patient Outreach (Signed)
  Care Coordination   Follow Up Visit Note   04/13/2022 Name: Mike Wright MRN: [id number] DOB: 07-07-1933  Mike Wright is a 89 y.o. year old male who sees Monico Blitz, MD for primary care. I spoke with  [name] by phone today.  What matters to the patients health and wellness today?  Improving balance and mobility and managing oxygen needs    Goals Addressed             This Visit's Progress    Improve Balance and Mobility   On track    Care Coordination Interventions: Evaluation of current treatment plan related to prior history of right TMA in November 2021 more recent Left TMA and patient's adherence to plan as established by provider Reviewed scheduled/upcoming provider appointments including 05/02/22 Vascular Surgeon and ABIs Discussed plans with patient for ongoing care management follow up and provided patient with direct contact information for care management team Assessed social determinant of health barriers Discussed mobility and ability to perform ADLs Using cane and rolling walker for ambulation He did have one fall one month ago. Injury to elbow that continues to have small "a pocket of fluid." PCP did evaluate at in office appointment. Symptom management recommended. Will follow-up with new, worsening, or unresolved symptoms Wearing hard bottom shoes when ambulating Assessed family support Lives at home with wife, who is very helpful, and has assistance from 2 nieces for transportation and other needs No open wounds on either foot at present Provided with [place] direct contact number [phone] and encouraged to reach out as needed       Oxygen Needs   On track    Care Coordination Interventions: Evaluation of current treatment plan related to oxygen needs and patient's adherence to plan as established by provider Currently using 2L via [place] PRN with PCO and floor concentrator Discussed visit with PCP and oxygen recertification. He did not switch  suppliers to [employer] but it still receiving O2 DME equipment Encouraged to reach out to [facility] as needed         SDOH assessments and interventions completed:  No     Care Coordination Interventions:  Yes, provided   Follow up plan: Follow up call scheduled for 05/18/22    Encounter Outcome:  Pt. Visit Completed   Chong Sicilian, BSN, RN-BC RN Care Coordinator [place]: [phone] Main #: [phone]

## 2022-05-23 NOTE — Patient Outreach (Addendum)
  Care Coordination   Follow Up Visit Note   05/23/2022 Name: Mike Wright MRN: [id number] DOB: 12-15-1933  Mike Wright is a 89 y.o. year old male who sees Mike Blitz, MD for primary care. I spoke with  Mike Wright by phone today.  What matters to the patients health and wellness today?  Improving balance and mobility, monitoring lower extremity edema   Goals      Improve Balance and Mobility     Care Coordination Goals: Patient will follow-up with PCP and specialists as planned and sooner if needed 08/15/22 Mike Bud, NP (cardio) Patient will continue to practice fall precautions Patient will use assistive devices, walker and cane, for ambulation Patient will increase activity level as tolerated with an ultimate goal of 150 minutes a week Patient will call RN Care Coordinator with any care coordination or resource needs [phone]     Lower Extremity Edema     Care Coordination Goals  Patient will monitor lower extremity for swelling/edema and report any worsening to cardiologist Patient will take Lasix 79mas prescribed (decreased from 863m Patient will prop legs up when at rest Patient will keep appt with cardiologist in May and reach out sooner if necessary Patient will reach out to [facility] needed [phone]        SDOH assessments and interventions completed:  Yes   Care Coordination Interventions:  Yes, provided  Interventions Today    Flowsheet Row Most Recent Value  Chronic Disease   Chronic disease during today's visit Atrial Fibrillation (AFib), Congestive Heart Failure (CHF), Other  [CAD, PAD w/ hx of bil TMA]  General Interventions   General Interventions Discussed/Reviewed General Interventions Discussed, General Interventions Reviewed, Doctor Visits, Durable Medical Equipment (DME)  Doctor Visits Discussed/Reviewed Doctor Visits Discussed, Doctor Visits Reviewed, Specialist, PCP  Durable Medical Equipment (DME) WaGilford Rile Other  [cane]  PCP/Specialist Visits Compliance with follow-up visit  Exercise Interventions   Exercise Discussed/Reviewed Physical Activity  Physical Activity Discussed/Reviewed Physical Activity Discussed  Education Interventions   Education Provided Provided Education  Provided Verbal Education On Medication, When to see the doctor  Pharmacy Interventions   Pharmacy Dicussed/Reviewed Medications and their functions  [lasix decreased to 407mtoprol d/c, amlodipine 2.5 started. Amlodipine can cause swelling in feet in ankles but typically at higher doses than he's taking. Monitor and notify cardio of any changes.]  Safety Interventions   Safety Discussed/Reviewed Fall Risk        Follow up plan: Follow up call scheduled for 07/05/22    Encounter Outcome:  Pt. Visit Completed   KriChong SicilianSN, RN-BC RN Care Coordinator [phone] #: [phone]

## 2022-05-25 DIAGNOSIS — I10 Essential (primary) hypertension: Secondary | ICD-10-CM | POA: Diagnosis not present

## 2022-05-25 DIAGNOSIS — I5032 Chronic diastolic (congestive) heart failure: Secondary | ICD-10-CM | POA: Diagnosis not present

## 2022-05-25 DIAGNOSIS — Z79899 Other long term (current) drug therapy: Secondary | ICD-10-CM | POA: Diagnosis not present

## 2022-05-26 LAB — BASIC METABOLIC PANEL
BUN/Creatinine Ratio: 17 (ref 10–24)
BUN: 24 mg/dL (ref 8–27)
CO2: 24 mmol/L (ref 20–29)
Calcium: 8.9 mg/dL (ref 8.6–10.2)
Chloride: 102 mmol/L (ref 96–106)
Creatinine, Ser: 1.43 mg/dL — ABNORMAL HIGH (ref 0.76–1.27)
Glucose: 206 mg/dL — ABNORMAL HIGH (ref 70–99)
Potassium: 4.3 mmol/L (ref 3.5–5.2)
Sodium: 140 mmol/L (ref 134–144)
eGFR: 48 mL/min/{1.73_m2} — ABNORMAL LOW (ref >59)

## 2022-05-26 LAB — MAGNESIUM: Magnesium: 2.1 mg/dL (ref 1.6–2.3)

## 2022-05-30 ENCOUNTER — Telehealth: Payer: Self-pay | Admitting: *Deleted

## 2022-05-30 NOTE — Telephone Encounter (Signed)
-----   Message from Arnoldo Lenis, MD sent at 05/30/2022  9:56 AM EST ----- Labs look ok, mildly decreased kidney function but similar to prior labs.   Zandra Abts MD

## 2022-05-30 NOTE — Telephone Encounter (Signed)
[name], LPN [id number] QA348G AM EST Back to Top    Notified, copy to pcp.

## 2022-06-06 DIAGNOSIS — T8484XA Pain due to internal orthopedic prosthetic devices, implants and grafts, initial encounter: Secondary | ICD-10-CM | POA: Diagnosis not present

## 2022-06-06 DIAGNOSIS — M25462 Effusion, left knee: Secondary | ICD-10-CM | POA: Diagnosis not present

## 2022-06-06 DIAGNOSIS — M25362 Other instability, left knee: Secondary | ICD-10-CM | POA: Diagnosis not present

## 2022-06-06 DIAGNOSIS — M25562 Pain in left knee: Secondary | ICD-10-CM | POA: Diagnosis not present

## 2022-06-06 DIAGNOSIS — Z96652 Presence of left artificial knee joint: Secondary | ICD-10-CM | POA: Diagnosis not present

## 2022-06-13 DIAGNOSIS — M25362 Other instability, left knee: Secondary | ICD-10-CM | POA: Diagnosis not present

## 2022-06-13 DIAGNOSIS — Z96652 Presence of left artificial knee joint: Secondary | ICD-10-CM | POA: Diagnosis not present

## 2022-06-13 DIAGNOSIS — M25462 Effusion, left knee: Secondary | ICD-10-CM | POA: Diagnosis not present

## 2022-06-13 DIAGNOSIS — M25562 Pain in left knee: Secondary | ICD-10-CM | POA: Diagnosis not present

## 2022-06-13 DIAGNOSIS — T8484XA Pain due to internal orthopedic prosthetic devices, implants and grafts, initial encounter: Secondary | ICD-10-CM | POA: Diagnosis not present

## 2022-06-13 DIAGNOSIS — R600 Localized edema: Secondary | ICD-10-CM | POA: Diagnosis not present

## 2022-06-14 DIAGNOSIS — Z89432 Acquired absence of left foot: Secondary | ICD-10-CM | POA: Diagnosis not present

## 2022-06-15 ENCOUNTER — Other Ambulatory Visit: Payer: Self-pay | Admitting: Cardiology

## 2022-06-21 DIAGNOSIS — M25362 Other instability, left knee: Secondary | ICD-10-CM | POA: Diagnosis not present

## 2022-06-21 DIAGNOSIS — M25462 Effusion, left knee: Secondary | ICD-10-CM | POA: Diagnosis not present

## 2022-06-21 DIAGNOSIS — Z96652 Presence of left artificial knee joint: Secondary | ICD-10-CM | POA: Diagnosis not present

## 2022-06-21 DIAGNOSIS — T8484XA Pain due to internal orthopedic prosthetic devices, implants and grafts, initial encounter: Secondary | ICD-10-CM | POA: Diagnosis not present

## 2022-06-21 DIAGNOSIS — M25562 Pain in left knee: Secondary | ICD-10-CM | POA: Diagnosis not present

## 2022-06-24 DIAGNOSIS — J449 Chronic obstructive pulmonary disease, unspecified: Secondary | ICD-10-CM | POA: Diagnosis not present

## 2022-06-24 DIAGNOSIS — E119 Type 2 diabetes mellitus without complications: Secondary | ICD-10-CM | POA: Diagnosis not present

## 2022-06-24 DIAGNOSIS — I2510 Atherosclerotic heart disease of native coronary artery without angina pectoris: Secondary | ICD-10-CM | POA: Diagnosis not present

## 2022-06-24 DIAGNOSIS — I110 Hypertensive heart disease with heart failure: Secondary | ICD-10-CM | POA: Diagnosis not present

## 2022-06-24 DIAGNOSIS — T8484XD Pain due to internal orthopedic prosthetic devices, implants and grafts, subsequent encounter: Secondary | ICD-10-CM | POA: Diagnosis not present

## 2022-06-24 DIAGNOSIS — I4891 Unspecified atrial fibrillation: Secondary | ICD-10-CM | POA: Diagnosis not present

## 2022-06-24 DIAGNOSIS — G2581 Restless legs syndrome: Secondary | ICD-10-CM | POA: Diagnosis not present

## 2022-06-24 DIAGNOSIS — I5032 Chronic diastolic (congestive) heart failure: Secondary | ICD-10-CM | POA: Diagnosis not present

## 2022-06-24 DIAGNOSIS — M25462 Effusion, left knee: Secondary | ICD-10-CM | POA: Diagnosis not present

## 2022-06-28 DIAGNOSIS — E1165 Type 2 diabetes mellitus with hyperglycemia: Secondary | ICD-10-CM | POA: Diagnosis not present

## 2022-06-28 DIAGNOSIS — I10 Essential (primary) hypertension: Secondary | ICD-10-CM | POA: Diagnosis not present

## 2022-06-28 DIAGNOSIS — Z89422 Acquired absence of other left toe(s): Secondary | ICD-10-CM | POA: Diagnosis not present

## 2022-06-28 DIAGNOSIS — Z299 Encounter for prophylactic measures, unspecified: Secondary | ICD-10-CM | POA: Diagnosis not present

## 2022-06-28 DIAGNOSIS — I5032 Chronic diastolic (congestive) heart failure: Secondary | ICD-10-CM | POA: Diagnosis not present

## 2022-06-28 DIAGNOSIS — Z89431 Acquired absence of right foot: Secondary | ICD-10-CM | POA: Diagnosis not present

## 2022-06-28 DIAGNOSIS — D649 Anemia, unspecified: Secondary | ICD-10-CM | POA: Diagnosis not present

## 2022-06-29 DIAGNOSIS — I110 Hypertensive heart disease with heart failure: Secondary | ICD-10-CM | POA: Diagnosis not present

## 2022-06-29 DIAGNOSIS — I2510 Atherosclerotic heart disease of native coronary artery without angina pectoris: Secondary | ICD-10-CM | POA: Diagnosis not present

## 2022-06-29 DIAGNOSIS — G2581 Restless legs syndrome: Secondary | ICD-10-CM | POA: Diagnosis not present

## 2022-06-29 DIAGNOSIS — E119 Type 2 diabetes mellitus without complications: Secondary | ICD-10-CM | POA: Diagnosis not present

## 2022-06-29 DIAGNOSIS — T8484XD Pain due to internal orthopedic prosthetic devices, implants and grafts, subsequent encounter: Secondary | ICD-10-CM | POA: Diagnosis not present

## 2022-06-29 DIAGNOSIS — I5032 Chronic diastolic (congestive) heart failure: Secondary | ICD-10-CM | POA: Diagnosis not present

## 2022-06-29 DIAGNOSIS — I4891 Unspecified atrial fibrillation: Secondary | ICD-10-CM | POA: Diagnosis not present

## 2022-06-29 DIAGNOSIS — M25462 Effusion, left knee: Secondary | ICD-10-CM | POA: Diagnosis not present

## 2022-06-29 DIAGNOSIS — J449 Chronic obstructive pulmonary disease, unspecified: Secondary | ICD-10-CM | POA: Diagnosis not present

## 2022-07-01 DIAGNOSIS — D649 Anemia, unspecified: Secondary | ICD-10-CM | POA: Diagnosis not present

## 2022-07-03 ENCOUNTER — Other Ambulatory Visit: Payer: Self-pay | Admitting: Cardiology

## 2022-07-05 ENCOUNTER — Encounter: Payer: Self-pay | Admitting: *Deleted

## 2022-07-05 ENCOUNTER — Ambulatory Visit: Payer: Self-pay | Admitting: *Deleted

## 2022-07-05 NOTE — Patient Outreach (Signed)
  Care Coordination   Follow Up Visit Note   07/05/2022 Name: UTKARSH RIESSEN MRN: [id number] DOB: Sep 11, 1933  CHRISTOHPER CARROWAY is a 89 y.o. year old male who sees [name], MD for primary care. I spoke with  [name] by phone today.  What matters to the patients health and wellness today?  Fall prevention, left knee pain/swelling, managing anemia    Goals Addressed             This Visit's Progress    Improve Balance and Mobility       Care Coordination Goals: Patient will follow-up with PCP and specialists as planned and sooner if needed 08/02/22 Ortho with [facility] in [place] Patient will continue to practice fall precautions Patient will use rolling walker for ambulation Patient will notify provider of any falls Patient will call RN Care Coordinator with any care coordination or resource needs [phone]     Manage Anemia       Care Coordination Goals: Patient will follow-up with PCP as planned and sooner if needed Patient will monitor for signs of worsening anemia and notify PCP if he develops any Patient will seek emergency medical attention if needed Patient will notify provider of any obvious signs of bleeding Patient will take medications as prescribed Patient will reach out to RN Care Coordinator [phone] with any care coordination or resource needs        SDOH assessments and interventions completed:  Yes  SDOH Interventions Today    Flowsheet Row Most Recent Value  SDOH Interventions   Transportation Interventions Intervention Not Indicated  Physical Activity Interventions Other (Comments)  [Left knee injury and weakness from anemia make exercising difficult]        Care Coordination Interventions:  Yes, provided  Interventions Today    Flowsheet Row Most Recent Value  Chronic Disease   Chronic disease during today's visit Other  [Left knee injury, anemia, fall risk]  General Interventions   General Interventions Discussed/Reviewed General  Interventions Discussed, General Interventions Reviewed, Labs, Durable Medical Equipment (DME), Doctor Visits  Labs Kidney Function, Hgb A1c every 6 months  [Hgb to monitor anemia. Per patient last Hgb at PCP office was 7.9. He is chronically anemic with hx of transfusion. Hx of severe reaction to iron infusiion, including cardiac arrest. Has to be cautious with dietary iron as well.]  Doctor Visits Discussed/Reviewed Doctor Visits Discussed, Specialist, Doctor Visits Reviewed, PCP  Durable Medical Equipment (DME) Dan Humphreys  PCP/Specialist Visits Compliance with follow-up visit  [follow-up with PCP as planned and with ortho on 08/02/22]  Exercise Interventions   Exercise Discussed/Reviewed Physical Activity  Physical Activity Discussed/Reviewed Physical Activity Discussed, Physical Activity Reviewed  Education Interventions   Education Provided Provided Education  Provided Verbal Education On Nutrition, When to see the doctor, Mental Health/Coping with Illness, Exercise  Nutrition Interventions   Nutrition Discussed/Reviewed Nutrition Discussed, Nutrition Reviewed  Pharmacy Interventions   Pharmacy Dicussed/Reviewed Medications and their functions  Safety Interventions   Safety Discussed/Reviewed Safety Discussed, Fall Risk, Home Safety  [mechanical fall on 05/27/22 and injury to left knee. No fracture or dislocation. Pos for pain and swelling. Hx of left knee arthroplasty.]  Home Safety Assistive Devices       Follow up plan: Follow up call scheduled for 07/18/22    Encounter Outcome:  Pt. Visit Completed   Demetrios Loll, BSN, RN-BC RN Care Coordinator [facility]  [facility] Direct Dial: [phone] Main #: [phone]

## 2022-07-13 DIAGNOSIS — T8484XD Pain due to internal orthopedic prosthetic devices, implants and grafts, subsequent encounter: Secondary | ICD-10-CM | POA: Diagnosis not present

## 2022-07-13 DIAGNOSIS — G2581 Restless legs syndrome: Secondary | ICD-10-CM | POA: Diagnosis not present

## 2022-07-13 DIAGNOSIS — I110 Hypertensive heart disease with heart failure: Secondary | ICD-10-CM | POA: Diagnosis not present

## 2022-07-13 DIAGNOSIS — J449 Chronic obstructive pulmonary disease, unspecified: Secondary | ICD-10-CM | POA: Diagnosis not present

## 2022-07-13 DIAGNOSIS — M25462 Effusion, left knee: Secondary | ICD-10-CM | POA: Diagnosis not present

## 2022-07-13 DIAGNOSIS — I4891 Unspecified atrial fibrillation: Secondary | ICD-10-CM | POA: Diagnosis not present

## 2022-07-13 DIAGNOSIS — E119 Type 2 diabetes mellitus without complications: Secondary | ICD-10-CM | POA: Diagnosis not present

## 2022-07-13 DIAGNOSIS — I5032 Chronic diastolic (congestive) heart failure: Secondary | ICD-10-CM | POA: Diagnosis not present

## 2022-07-13 DIAGNOSIS — I2510 Atherosclerotic heart disease of native coronary artery without angina pectoris: Secondary | ICD-10-CM | POA: Diagnosis not present

## 2022-07-14 ENCOUNTER — Telehealth: Payer: Self-pay | Admitting: Cardiology

## 2022-07-14 NOTE — Telephone Encounter (Signed)
Reports swelling in legs, hands and abdomen that started one week ago. Weighs daily and reports gaining 8 lbs in 2 weeks. Weight today is 199 lbs. Yesterday, weight was 196 lbs, Tuesday, 197 lbs, Monday 197 lbs, Sunday 198 lbs. No change in diet. Reports having chest pain, dizziness and SOB all the time. Denies active chest pain. Currently has oxygen at home to use as needed for the past 3 years and uses 2-3 L. Did not check BP or HR today. O2 Sat is 92-93%. Reports started taking furosemide 40 mg daily since last Thursday and takes potassium 20 meq daily. Medications reviewed. Reports recent lab work with PCP that showed Hg 7.6. Says he did not receive any treatment. Reports staying well hydrated and is eating well. Will send to provider for recommendations. Advised if he develops worsening symptoms, to go to the ED for an evaluation. Verbalized understanding of plan.

## 2022-07-14 NOTE — Telephone Encounter (Signed)
If taking lasix  daily and no improvement take  bid until Monday and then update [place] again. Hgb 7.6, he has chronic anemia was around 7.9 last few prior checks. Defer to pcp, its not a sudden drop.  Dominga Ferry MD

## 2022-07-14 NOTE — Telephone Encounter (Signed)
Patient informed and verbalized understanding of plan. 

## 2022-07-14 NOTE — Telephone Encounter (Signed)
Patient is calling to talk with Dr. Wyline Mood or nurse. Would not display reason for call

## 2022-07-18 ENCOUNTER — Inpatient Hospital Stay (HOSPITAL_COMMUNITY)
Admission: EM | Admit: 2022-07-18 | Discharge: 2022-07-27 | DRG: 291 | Disposition: A | Discharge status: Home Health | Payer: Medicare HMO | Attending: Family Medicine | Admitting: Family Medicine

## 2022-07-18 ENCOUNTER — Encounter: Payer: Self-pay | Admitting: *Deleted

## 2022-07-18 ENCOUNTER — Encounter: Payer: Self-pay | Admitting: Cardiology

## 2022-07-18 ENCOUNTER — Other Ambulatory Visit: Payer: Self-pay

## 2022-07-18 ENCOUNTER — Telehealth: Payer: Self-pay | Admitting: *Deleted

## 2022-07-18 ENCOUNTER — Ambulatory Visit: Payer: Self-pay | Admitting: *Deleted

## 2022-07-18 ENCOUNTER — Ambulatory Visit: Payer: Medicare HMO | Attending: Cardiology | Admitting: Cardiology

## 2022-07-18 ENCOUNTER — Emergency Department (HOSPITAL_COMMUNITY): Payer: Medicare HMO

## 2022-07-18 ENCOUNTER — Encounter (HOSPITAL_COMMUNITY): Payer: Self-pay

## 2022-07-18 VITALS — BP 140/60 | HR 60 | Ht 67.0 in | Wt 203.4 lb

## 2022-07-18 DIAGNOSIS — E876 Hypokalemia: Secondary | ICD-10-CM | POA: Diagnosis present

## 2022-07-18 DIAGNOSIS — E1151 Type 2 diabetes mellitus with diabetic peripheral angiopathy without gangrene: Secondary | ICD-10-CM | POA: Diagnosis present

## 2022-07-18 DIAGNOSIS — Z7901 Long term (current) use of anticoagulants: Secondary | ICD-10-CM | POA: Diagnosis not present

## 2022-07-18 DIAGNOSIS — M47812 Spondylosis without myelopathy or radiculopathy, cervical region: Secondary | ICD-10-CM | POA: Diagnosis present

## 2022-07-18 DIAGNOSIS — E119 Type 2 diabetes mellitus without complications: Secondary | ICD-10-CM

## 2022-07-18 DIAGNOSIS — I110 Hypertensive heart disease with heart failure: Secondary | ICD-10-CM | POA: Diagnosis not present

## 2022-07-18 DIAGNOSIS — Z794 Long term (current) use of insulin: Secondary | ICD-10-CM

## 2022-07-18 DIAGNOSIS — Z66 Do not resuscitate: Secondary | ICD-10-CM | POA: Diagnosis present

## 2022-07-18 DIAGNOSIS — D649 Anemia, unspecified: Secondary | ICD-10-CM | POA: Diagnosis not present

## 2022-07-18 DIAGNOSIS — J449 Chronic obstructive pulmonary disease, unspecified: Secondary | ICD-10-CM | POA: Diagnosis not present

## 2022-07-18 DIAGNOSIS — Z6831 Body mass index (BMI) 31.0-31.9, adult: Secondary | ICD-10-CM

## 2022-07-18 DIAGNOSIS — Z955 Presence of coronary angioplasty implant and graft: Secondary | ICD-10-CM

## 2022-07-18 DIAGNOSIS — I484 Atypical atrial flutter: Secondary | ICD-10-CM | POA: Diagnosis not present

## 2022-07-18 DIAGNOSIS — Z515 Encounter for palliative care: Secondary | ICD-10-CM | POA: Diagnosis not present

## 2022-07-18 DIAGNOSIS — Z825 Family history of asthma and other chronic lower respiratory diseases: Secondary | ICD-10-CM

## 2022-07-18 DIAGNOSIS — N1831 Chronic kidney disease, stage 3a: Secondary | ICD-10-CM | POA: Diagnosis present

## 2022-07-18 DIAGNOSIS — I739 Peripheral vascular disease, unspecified: Secondary | ICD-10-CM

## 2022-07-18 DIAGNOSIS — J9621 Acute and chronic respiratory failure with hypoxia: Secondary | ICD-10-CM | POA: Diagnosis present

## 2022-07-18 DIAGNOSIS — E669 Obesity, unspecified: Secondary | ICD-10-CM | POA: Diagnosis present

## 2022-07-18 DIAGNOSIS — I482 Chronic atrial fibrillation, unspecified: Secondary | ICD-10-CM | POA: Diagnosis not present

## 2022-07-18 DIAGNOSIS — I10 Essential (primary) hypertension: Secondary | ICD-10-CM | POA: Diagnosis not present

## 2022-07-18 DIAGNOSIS — Z96653 Presence of artificial knee joint, bilateral: Secondary | ICD-10-CM | POA: Diagnosis present

## 2022-07-18 DIAGNOSIS — Z7902 Long term (current) use of antithrombotics/antiplatelets: Secondary | ICD-10-CM

## 2022-07-18 DIAGNOSIS — Z89411 Acquired absence of right great toe: Secondary | ICD-10-CM

## 2022-07-18 DIAGNOSIS — G2581 Restless legs syndrome: Secondary | ICD-10-CM | POA: Diagnosis not present

## 2022-07-18 DIAGNOSIS — T8484XD Pain due to internal orthopedic prosthetic devices, implants and grafts, subsequent encounter: Secondary | ICD-10-CM | POA: Diagnosis not present

## 2022-07-18 DIAGNOSIS — I130 Hypertensive heart and chronic kidney disease with heart failure and stage 1 through stage 4 chronic kidney disease, or unspecified chronic kidney disease: Secondary | ICD-10-CM | POA: Diagnosis not present

## 2022-07-18 DIAGNOSIS — Z7189 Other specified counseling: Secondary | ICD-10-CM | POA: Diagnosis not present

## 2022-07-18 DIAGNOSIS — I2510 Atherosclerotic heart disease of native coronary artery without angina pectoris: Secondary | ICD-10-CM

## 2022-07-18 DIAGNOSIS — I509 Heart failure, unspecified: Secondary | ICD-10-CM | POA: Diagnosis not present

## 2022-07-18 DIAGNOSIS — J90 Pleural effusion, not elsewhere classified: Secondary | ICD-10-CM | POA: Diagnosis not present

## 2022-07-18 DIAGNOSIS — Z820 Family history of epilepsy and other diseases of the nervous system: Secondary | ICD-10-CM

## 2022-07-18 DIAGNOSIS — Z885 Allergy status to narcotic agent status: Secondary | ICD-10-CM

## 2022-07-18 DIAGNOSIS — I480 Paroxysmal atrial fibrillation: Secondary | ICD-10-CM | POA: Diagnosis present

## 2022-07-18 DIAGNOSIS — G928 Other toxic encephalopathy: Secondary | ICD-10-CM | POA: Diagnosis not present

## 2022-07-18 DIAGNOSIS — I350 Nonrheumatic aortic (valve) stenosis: Secondary | ICD-10-CM | POA: Diagnosis present

## 2022-07-18 DIAGNOSIS — I5023 Acute on chronic systolic (congestive) heart failure: Secondary | ICD-10-CM | POA: Diagnosis not present

## 2022-07-18 DIAGNOSIS — Z8249 Family history of ischemic heart disease and other diseases of the circulatory system: Secondary | ICD-10-CM

## 2022-07-18 DIAGNOSIS — I5033 Acute on chronic diastolic (congestive) heart failure: Secondary | ICD-10-CM

## 2022-07-18 DIAGNOSIS — D509 Iron deficiency anemia, unspecified: Secondary | ICD-10-CM

## 2022-07-18 DIAGNOSIS — Z888 Allergy status to other drugs, medicaments and biological substances status: Secondary | ICD-10-CM

## 2022-07-18 DIAGNOSIS — E1122 Type 2 diabetes mellitus with diabetic chronic kidney disease: Secondary | ICD-10-CM | POA: Diagnosis present

## 2022-07-18 DIAGNOSIS — K219 Gastro-esophageal reflux disease without esophagitis: Secondary | ICD-10-CM | POA: Diagnosis present

## 2022-07-18 DIAGNOSIS — I4892 Unspecified atrial flutter: Secondary | ICD-10-CM | POA: Diagnosis present

## 2022-07-18 DIAGNOSIS — Z89412 Acquired absence of left great toe: Secondary | ICD-10-CM

## 2022-07-18 DIAGNOSIS — E039 Hypothyroidism, unspecified: Secondary | ICD-10-CM | POA: Diagnosis present

## 2022-07-18 DIAGNOSIS — I5032 Chronic diastolic (congestive) heart failure: Secondary | ICD-10-CM | POA: Diagnosis not present

## 2022-07-18 DIAGNOSIS — I4891 Unspecified atrial fibrillation: Secondary | ICD-10-CM | POA: Diagnosis not present

## 2022-07-18 DIAGNOSIS — M25462 Effusion, left knee: Secondary | ICD-10-CM | POA: Diagnosis not present

## 2022-07-18 DIAGNOSIS — Z981 Arthrodesis status: Secondary | ICD-10-CM

## 2022-07-18 DIAGNOSIS — I272 Pulmonary hypertension, unspecified: Secondary | ICD-10-CM | POA: Diagnosis present

## 2022-07-18 DIAGNOSIS — Z833 Family history of diabetes mellitus: Secondary | ICD-10-CM

## 2022-07-18 DIAGNOSIS — E782 Mixed hyperlipidemia: Secondary | ICD-10-CM

## 2022-07-18 DIAGNOSIS — R54 Age-related physical debility: Secondary | ICD-10-CM | POA: Diagnosis present

## 2022-07-18 DIAGNOSIS — Z823 Family history of stroke: Secondary | ICD-10-CM

## 2022-07-18 DIAGNOSIS — F05 Delirium due to known physiological condition: Secondary | ICD-10-CM | POA: Diagnosis not present

## 2022-07-18 DIAGNOSIS — I5020 Unspecified systolic (congestive) heart failure: Principal | ICD-10-CM

## 2022-07-18 DIAGNOSIS — Z9981 Dependence on supplemental oxygen: Secondary | ICD-10-CM

## 2022-07-18 DIAGNOSIS — E1152 Type 2 diabetes mellitus with diabetic peripheral angiopathy with gangrene: Secondary | ICD-10-CM | POA: Diagnosis not present

## 2022-07-18 DIAGNOSIS — Z881 Allergy status to other antibiotic agents status: Secondary | ICD-10-CM

## 2022-07-18 DIAGNOSIS — Z87891 Personal history of nicotine dependence: Secondary | ICD-10-CM

## 2022-07-18 DIAGNOSIS — E7849 Other hyperlipidemia: Secondary | ICD-10-CM | POA: Diagnosis not present

## 2022-07-18 DIAGNOSIS — I5031 Acute diastolic (congestive) heart failure: Secondary | ICD-10-CM | POA: Diagnosis not present

## 2022-07-18 DIAGNOSIS — R0602 Shortness of breath: Secondary | ICD-10-CM | POA: Diagnosis not present

## 2022-07-18 DIAGNOSIS — Z79899 Other long term (current) drug therapy: Secondary | ICD-10-CM

## 2022-07-18 DIAGNOSIS — G4730 Sleep apnea, unspecified: Secondary | ICD-10-CM | POA: Diagnosis present

## 2022-07-18 DIAGNOSIS — Z9842 Cataract extraction status, left eye: Secondary | ICD-10-CM

## 2022-07-18 DIAGNOSIS — Z91040 Latex allergy status: Secondary | ICD-10-CM

## 2022-07-18 LAB — BASIC METABOLIC PANEL
Anion gap: 8 (ref 5–15)
BUN: 22 mg/dL (ref 8–23)
CO2: 26 mmol/L (ref 22–32)
Calcium: 8.3 mg/dL — ABNORMAL LOW (ref 8.9–10.3)
Chloride: 104 mmol/L (ref 98–111)
Creatinine, Ser: 1.5 mg/dL — ABNORMAL HIGH (ref 0.61–1.24)
GFR, Estimated: 45 mL/min — ABNORMAL LOW (ref >60)
Glucose, Bld: 108 mg/dL — ABNORMAL HIGH (ref 70–99)
Potassium: 4.1 mmol/L (ref 3.5–5.1)
Sodium: 138 mmol/L (ref 135–145)

## 2022-07-18 LAB — CBC
HCT: 26.6 % — ABNORMAL LOW (ref 39.0–52.0)
Hemoglobin: 7.6 g/dL — ABNORMAL LOW (ref 13.0–17.0)
MCH: 20.8 pg — ABNORMAL LOW (ref 26.0–34.0)
MCHC: 28.6 g/dL — ABNORMAL LOW (ref 30.0–36.0)
MCV: 72.9 fL — ABNORMAL LOW (ref 80.0–100.0)
Platelets: 106 10*3/uL — ABNORMAL LOW (ref 150–400)
RBC: 3.65 MIL/uL — ABNORMAL LOW (ref 4.22–5.81)
RDW: 19.8 % — ABNORMAL HIGH (ref 11.5–15.5)
WBC: 5.4 10*3/uL (ref 4.0–10.5)
nRBC: 0 % (ref 0.0–0.2)

## 2022-07-18 LAB — GLUCOSE, CAPILLARY: Glucose-Capillary: 114 mg/dL — ABNORMAL HIGH (ref 70–99)

## 2022-07-18 LAB — BRAIN NATRIURETIC PEPTIDE: B Natriuretic Peptide: 332 pg/mL — ABNORMAL HIGH (ref 0.0–100.0)

## 2022-07-18 LAB — MAGNESIUM: Magnesium: 2.2 mg/dL (ref 1.7–2.4)

## 2022-07-18 MED ORDER — FUROSEMIDE 10 MG/ML IJ SOLN
60.0000 mg | Freq: Once | INTRAMUSCULAR | Status: AC
  Administered 2022-07-18: 60 mg via INTRAVENOUS
  Filled 2022-07-18: qty 6

## 2022-07-18 NOTE — Patient Instructions (Addendum)
Medication Instructions:  Continue all current medications.   Labwork: none  Testing/Procedures: none  Follow-Up: Keep follow up as scheduled for May with Sharlene Dory, NP   Any Other Special Instructions Will Be Listed Below (If Applicable). Going ED   If you need a refill on your cardiac medications before your next appointment, please call your pharmacy.

## 2022-07-18 NOTE — Patient Outreach (Signed)
Care Coordination   Follow Up Visit Note   07/18/2022 Name: Mike Wright MRN: [id number] DOB: [birth year]  Mike Wright is a 89 y.o. year old male who sees [name], MD for primary care. I spoke with  Mike Wright by phone today.  What matters to the patients health and wellness today?  Managing Edema  Message sent to Dr Wyline Mood: Patient reports that urination has increased since starting lasix  BID on 4/18 and that he has lost about a pound each day since then. His weight this morning was 196. He has not had a noticeable decrease in abd swelling or lower extremity edema. Reports that right leg in particular is about twice the size that it normally is. Denies redness or pain. Per patient, he had a stent placed in that leg about 2-3 years ago. States that it has been swelling for about 3-4 months and has worsened recently. Left leg swelling resolves overnight but right leg remains swollen. O2 sats are low 90s without supplemental O2. He has not checked his blood pressure. I did encourage him to check and record it to let cardiologist know it's out of range. He said, "I just don't feel normal. I can't explain it." Denies any increased chest pain or shortness of breath.     Goals Addressed             This Visit's Progress    Lower Extremity Edema   On track    Care Coordination Goals  Patient will monitor lower extremity for swelling/edema and report any worsening to cardiologist Patient will take Lasix  as prescribed Increased to  BID from 07/14/22-07/18/22 Waiting for cardiology recommendation on medication dosage after today Patient will talk with cardiology office today regarding current edema Patient will continue to monitor and record weights daily and will reach out to cardio with any weight gain of >2 lbs overnight of >5 lbs in one week Patient will prop legs up when at rest Patient will check and record blood pressure and reach out to cardio with any  readings outside of recommended range Patient will check and record O2 sats and reach out to provider with any readings outside of recommended range Patient will keep appt with cardiologist in May and reach out sooner if necessary Patient will reach out to RN Care Coordinator as needed [phone]          SDOH assessments and interventions completed:  Yes  SDOH Interventions Today    Flowsheet Row Most Recent Value  SDOH Interventions   Transportation Interventions Intervention Not Indicated  Physical Activity Interventions Other (Comments)  [Left knee injury and weakness from anemia make exercising difficult]        Care Coordination Interventions:  Yes, provided  Interventions Today    Flowsheet Row Most Recent Value  Chronic Disease   Chronic disease during today's visit Other, Congestive Heart Failure (CHF)  [Edema]  General Interventions   General Interventions Discussed/Reviewed General Interventions Discussed, General Interventions Reviewed, Doctor Visits, Communication with, Durable Medical Equipment (DME)  Doctor Visits Discussed/Reviewed Doctor Visits Discussed, Doctor Visits Reviewed, Specialist, PCP  Durable Medical Equipment (DME) BP Cuff, Oxygen, Other  [Scales, Pulse Ox]  PCP/Specialist Visits Compliance with follow-up visit  Communication with RN, PCP/Specialists  [Dr Branch & Carlye Grippe, RN re: lower extremity edema. Pt reached out to them on 4/18 and was told to provide a f/u today. I provided that information in a staff message.]  Exercise Interventions   Physical  Activity Discussed/Reviewed Physical Activity Reviewed  Education Interventions   Education Provided Provided Education  Provided Verbal Education On When to see the doctor, Medication  [check & record blood pressure, pulse, weight, and O2 daily & PRN. Notify cardio of any readings outside of recommended range. Edema Management.]  Nutrition Interventions   Nutrition Discussed/Reviewed  Decreasing salt, Nutrition Reviewed  Pharmacy Interventions   Pharmacy Dicussed/Reviewed Medications and their functions, Pharmacy Topics Discussed, Pharmacy Topics Reviewed  Safety Interventions   Safety Discussed/Reviewed Safety Discussed       Follow up plan: Follow up call scheduled for 08/03/22    Encounter Outcome:  Pt. Visit Completed   Demetrios Loll, BSN, RN-BC RN Care Coordinator [facility]  [facility] Direct Dial: [phone] Main #: [phone]

## 2022-07-18 NOTE — Telephone Encounter (Signed)
-----   Message from Gwenith Daily, RN sent at 07/18/2022 11:26 AM EDT ----- Regarding: Edema follow-up Hi Dr Wyline Mood and Isabelle Course,  I talked with Mr Nepomuceno this morning for a routine care management call. He reports that urination has increased since starting lasix  BID and that he has lost about a pound each day since then. His weight this morning was 196. He has not had a noticeable decrease in abd swelling or lower extremity edema. Reports that right leg in particular is about twice the size that it normally is. Per patient, he had a stent placed in that leg about 2-3 years ago. States that it has been swelling for about 3-4 months and has worsened recently. Left leg swelling resolves overnight but right leg remains swollen. O2 sats are low 90s without supplemental O2. He has not checked his blood pressure. I did encourage him to check and record it and to let you know if it's out of range. He said, "I just don't feel normal. I can't explain it." Denies any increased chest pain or shortness of breath.   I wanted to update you since you requested that he provide an update today. Can you all give him a call today with your recommendations?  Let me know if I can be of any assistance.   Thank you,  Demetrios Loll, BSN, RN-BC RN Care Coordinator [facility]  [facility] Direct Dial: [phone] Main #: [phone]

## 2022-07-18 NOTE — ED Triage Notes (Signed)
Pt reports he has swelling to his feet, legs, and abdomen.  Pt reports he usually need IV lasix when this happens.

## 2022-07-18 NOTE — Telephone Encounter (Signed)
Contacted and gave appointment to see Branch today at 1:40 pm. Says he will have to get transportation to office since he doesn't drive.

## 2022-07-18 NOTE — ED Provider Notes (Signed)
[place] [facility] AT [facility] Provider Note   CSN: [id number] Arrival date & time: 07/18/22  1434     History {Add pertinent medical, surgical, social history, OB history to HPI:1} Chief Complaint  Patient presents with   Edema    Mike Wright is a 89 y.o. male.  Patient has a history of COPD and kidney disease.  He presented at the cardiologist office short of breath.  He has significant swelling to his legs.  He is in congestive heart failure.  He was sent here from the cardiologist office to be admitted and treated with Lasix 60 mg twice a day IV   Shortness of Breath      Home Medications Prior to Admission medications   Medication Sig Start Date End Date Taking? Authorizing Provider  Accu-Chek Softclix Lancets lancets  02/19/20   [provider]  acetaminophen (TYLENOL) 500 MG tablet Take 1 tablet (500 mg total) by mouth every 6 (six) hours as needed for mild pain or headache. Patient taking differently: Take 1,000 mg by mouth every 6 (six) hours as needed for mild pain or headache. 03/03/19   Drema Dallas, MD  Alcohol Swabs (B-D SINGLE USE SWABS REGULAR) PADS  02/17/20   [provider]  amLODipine (NORVASC) 2.5 MG tablet Take 2.5 mg by mouth daily.    [provider]  apixaban (ELIQUIS) 2.5 MG TABS tablet Take 1 tablet (2.5 mg total) by mouth 2 (two) times daily. Patient taking differently: Take 5 mg by mouth 2 (two) times daily. 12/19/20   Baglia, Corrina, PA-C  atorvastatin (LIPITOR) 80 MG tablet Take 80 mg by mouth every evening.  05/23/19   [provider]  Blood Glucose Monitoring Suppl (ACCU-CHEK GUIDE) w/Device KIT  02/19/20   [provider]  Cholecalciferol (VITAMIN D3) 50 MCG (2000 UT) TABS Take 2,000 Units by mouth daily.    [provider]  clopidogrel (PLAVIX) 75 MG tablet Take 1 tablet (75 mg total) by mouth daily with breakfast. 12/19/20   Baglia, Corrina, PA-C   Cyanocobalamin (B-12) 2500 MCG TABS Take 2,500 mcg by mouth daily.    [provider]  DROPLET INSULIN SYRINGE 31G X 5/16" 1 ML MISC  09/24/19   [provider]  folic acid (FOLVITE) 1 MG tablet Take 1 tablet (1 mg total) by mouth daily. 12/06/17   Meredeth Ide, MD  furosemide (LASIX) 40 MG tablet TAKE 1 TABLET EVERY DAY AS NEEDED FOR EDEMA (SWELLING) 07/04/22   Antoine Poche, MD  insulin glargine (LANTUS) 100 UNIT/ML injection Inject 30 Units into the skin 2 (two) times daily as needed.    [provider]  ipratropium-albuterol (DUONEB) 0.5-2.5 (3) MG/3ML SOLN Take 3 mLs by nebulization every other day. 11/08/19   [provider]  isosorbide mononitrate (IMDUR) 60 MG 24 hr tablet TAKE 1 TABLET TWICE DAILY (DOSE INCREASE 10/26/21) 06/15/22   Antoine Poche, MD  nitroGLYCERIN (NITROSTAT) 0.4 MG SL tablet PLACE 1 TABLET (0.4 MG TOTAL) UNDER THE TONGUE EVERY 5  MINUTES AS NEEDED FOR CHEST PAIN. Patient taking differently: Place 0.4 mg under the tongue every 5 (five) minutes x 3 doses as needed for chest pain. 05/22/17   Antoine Poche, MD  OXYGEN Inhale 2 L/min into the lungs See admin instructions. 2 L/min of oxygen at bedtime and during the day as needed for shortness of breath    [provider]  pantoprazole (PROTONIX) 40 MG tablet Take 1  tablet (40 mg total) by mouth 2 (two) times daily. To protect stomach while taking multiple blood thinners. Patient taking differently: Take 40 mg by mouth daily. To protect stomach while taking multiple blood thinners. 11/29/18   Vassie Loll, MD  potassium chloride SA (K-DUR) 20 MEQ tablet Take 1 tablet (20 mEq total) by mouth daily. 11/30/18   Vassie Loll, MD  SPIRIVA HANDIHALER 18 MCG inhalation capsule Place 1 capsule into inhaler and inhale daily after breakfast.  01/23/20   [provider]  traZODone (DESYREL) 100 MG tablet Take 200 mg by mouth at bedtime. 03/25/16   [provider]  TRUE  METRIX BLOOD GLUCOSE TEST test strip  07/12/19   [provider]      Allergies    Codeine, Doxycycline, Feraheme [ferumoxytol], Heparin, Iron, Losartan, Oxycodone, and Latex    Review of Systems   Review of Systems  Respiratory:  Positive for shortness of breath.     Physical Exam Updated Vital Signs BP 135/72 (BP Location: Right Arm)   Pulse (!) 56   Temp 97.8 F (36.6 C) (Oral)   Resp 19   Ht  (1.702 m)   Wt 92.3 kg   SpO2 95%   BMI 31.86 kg/m  Physical Exam  ED Results / Procedures / Treatments   Labs (all labs ordered are listed, but only abnormal results are displayed) Labs Reviewed  BASIC METABOLIC PANEL - Abnormal; Notable for the following components:      Result Value   Glucose, Bld 108 (*)    Creatinine, Ser 1.50 (*)    Calcium 8.3 (*)    GFR, Estimated 45 (*)    All other components within normal limits  CBC - Abnormal; Notable for the following components:   RBC 3.65 (*)    Hemoglobin 7.6 (*)    HCT 26.6 (*)    MCV 72.9 (*)    MCH 20.8 (*)    MCHC 28.6 (*)    RDW 19.8 (*)    Platelets 106 (*)    All other components within normal limits  BRAIN NATRIURETIC PEPTIDE - Abnormal; Notable for the following components:   B Natriuretic Peptide 332.0 (*)    All other components within normal limits  MAGNESIUM    EKG EKG Interpretation  Date/Time:  Monday July 18 2022 17:22:13 EDT Ventricular Rate:  63 PR Interval:    QRS Duration: 90 QT Interval:  446 QTC Calculation: 456 R Axis:   -12 Text Interpretation: Atrial fibrillation Abnormal ECG When compared with ECG of 02-Feb-2020 22:54, PREVIOUS ECG IS PRESENT Confirmed by Bethann Berkshire [phone]) on 07/18/2022 8:26:17 PM  Radiology DG Chest 2 View  Result Date: 07/18/2022 CLINICAL DATA:  swelling EXAM: CHEST - 2 VIEW COMPARISON:  Chest x-ray July 26, 2019. FINDINGS: Mild diffuse interstitial opacities. Small right pleural effusion. No visible pneumothorax. Enlarged cardiac silhouette.  IMPRESSION: 1. Probable mild interstitial edema.  Small right pleural effusion. 2. Cardiomegaly. Electronically Signed   By: Feliberto Harts M.D.   On: 07/18/2022 15:58    Procedures Procedures  {Document cardiac monitor, telemetry assessment procedure when appropriate:1}  Medications Ordered in ED Medications  furosemide (LASIX) injection 60 mg (60 mg Intravenous Given 07/18/22 2034)    ED Course/ Medical Decision Making/ A&P   {   Click here for ABCD2, HEART and other calculatorsREFRESH Note before signing :1}  Medical Decision Making Amount and/or Complexity of Data Reviewed Labs: ordered. Radiology: ordered.  Risk Prescription drug management. Decision regarding hospitalization.   Congestive heart failure and anasarca.  Patient admitted to medicine with cardiology consult  {Document critical care time when appropriate:1} {Document review of labs and clinical decision tools ie heart score, Chads2Vasc2 etc:1}  {Document your independent review of radiology images, and any outside records:1} {Document your discussion with family members, caretakers, and with consultants:1} {Document social determinants of health affecting pt's care:1} {Document your decision making why or why not admission, treatments were needed:1} Final Clinical Impression(s) / ED Diagnoses Final diagnoses:  Systolic congestive heart failure, unspecified HF chronicity    Rx / DC Orders ED Discharge Orders     None

## 2022-07-18 NOTE — H&P (Signed)
History and Physical    Patient: Mike Wright WUJ:[id number] DOB: [birth year] DOA: 07/18/2022 DOS: the patient was seen and examined on 07/19/2022 PCP: [name], MD  Patient coming from: Home  Chief Complaint:  Chief Complaint  Patient presents with   Edema   HPI: [name] is a 89 y.o. male with medical history significant of PAD, CAD, atrial fibrillation on Eliquis, diabetes mellitus who presents to the emergency department from cardiologist office.  Patient complained of 1 week onset of leg swelling which has extended to distention of the abdomen.  He takes Lasix at home as needed for swelling of legs.  However, he has been taking Lasix daily since onset of symptoms without much improvement, so he went to his cardiologist today and was asked to go to the ED for diuresis.  ED Course: In the emergency department, BP was 155/112, respiratory rate 21 bpm, other vital signs were within normal range.  Workup in the ED shows microcytic anemia, BMP was normal except for creatinine of 1.50, blood glucose 108 BNP 332. Chest x-ray showed probable mild interstitial edema.  Small right pleural effusion.  Cardiomegaly. IV Lasix 60 mg x 1 was given, hospitalist was asked to admit patient for further evaluation and management.  Review of Systems: Review of systems as noted in the HPI. All other systems reviewed and are negative.   Past Medical History:  Diagnosis Date   Anemia    a. mild, noted 04/2017.   CAD in native artery    a. [place] 04/2017 s/p DES to D1, DES to prox-mid LAD, DES to prox LAD overlapping the prior stent, LVEF 55-65%.    Chronic diastolic CHF (congestive heart failure)    Constipation    COPD (chronic obstructive pulmonary disease)    Diabetic ulcer of toe    DJD (degenerative joint disease) of cervical spine    Dysrhythmia    AFib   Essential hypertension    GERD (gastroesophageal reflux disease)    History of hiatal hernia    HIT (heparin-induced  thrombocytopenia)    Hypothyroidism    Hypoxia    a. went home on home O2 04/2017.   Insomnia    Mixed hyperlipidemia    PAD (peripheral artery disease)    PAF (paroxysmal atrial fibrillation)    PVD (peripheral vascular disease)    Renal insufficiency    Retinal hemorrhage    lost 90% of vision.   Retinitis    Sinus bradycardia    a. HR 30s-40s in 04/2017 -> diltiazem stopped, metoprolol reduced.   Sleep apnea    "chose not to order CPAP at this time" (05/18/2017)   Type 2 diabetes mellitus    Wears glasses    Wheezing    a. suspected COPD 04/2017. Former tobacco x 40 years.   Past Surgical History:  Procedure Laterality Date   ABDOMINAL AORTOGRAM W/LOWER EXTREMITY N/A 09/15/2017   Procedure: ABDOMINAL AORTOGRAM W/LOWER EXTREMITY;  Surgeon: Nada Libman, MD;  [facility]: [facility];  Service: Cardiovascular;  Laterality: N/A;   ABDOMINAL AORTOGRAM W/LOWER EXTREMITY N/A 02/04/2020   Procedure: ABDOMINAL AORTOGRAM W/LOWER EXTREMITY;  Surgeon: Nada Libman, MD;  Location: [facility];  Service: Cardiovascular;  Laterality: N/A;   ABDOMINAL AORTOGRAM W/LOWER EXTREMITY N/A 12/15/2020   Procedure: ABDOMINAL AORTOGRAM W/LOWER EXTREMITY;  Surgeon: Nada Libman, MD;  Location: [facility];  Service: Cardiovascular;  Laterality: N/A;   AMPUTATION Bilateral 09/20/2017   Procedure: BILATERAL GREAT  TOE AMPUTATIONS INCLUDING METATARSAL HEADS;  Surgeon: Nada Libman, MD;  Location: [facility] OR;  Service: Vascular;  Laterality: Bilateral;   AMPUTATION Right 03/01/2019   Procedure: AMPUTATION TOES;  Surgeon: Chuck Hint, MD;  Location: [facility] OR;  Service: Vascular;  Laterality: Right;   AMPUTATION Right 05/29/2019   Procedure: AMPUTATION RIGHT THIRD TOE;  Surgeon: Nada Libman, MD;  Location: [facility] OR;  Service: Vascular;  Laterality: Right;   AMPUTATION Right 09/26/2019   Procedure: AMPUTATION RIGHT FOURTH TOE AND FIFTH TOES;  Surgeon: Nada Libman, MD;  Location:  [facility] OR;  Service: Vascular;  Laterality: Right;   ANGIOPLASTY Right 09/20/2017   Procedure: ANGIOPLASTY RIGHT TIBIAL ARTERY;  Surgeon: Nada Libman, MD;  Location: [facility] OR;  Service: Vascular;  Laterality: Right;   APPENDECTOMY     APPLICATION OF WOUND VAC  12/17/2020   Procedure: APPLICATION OF WOUND VAC;  Surgeon: Nada Libman, MD;  Location: [facility] OR;  Service: Vascular;;   BACK SURGERY     CARDIAC CATHETERIZATION  1980s; 2012;   CATARACT EXTRACTION Left 2004   COLONOSCOPY  2004   FLEISHMAN TICS   COLONOSCOPY WITH PROPOFOL N/A 11/28/2018   Procedure: COLONOSCOPY WITH PROPOFOL;  Surgeon: Malissa Hippo, MD;  [facility]: [facility];  Service: Endoscopy;  Laterality: N/A;   CORONARY ANGIOPLASTY WITH STENT PLACEMENT  05/18/2017   "3 stents"   CORONARY STENT INTERVENTION N/A 05/18/2017   Procedure: CORONARY STENT INTERVENTION;  Surgeon: Corky Crafts, MD;  Location: [facility] INVASIVE CV LAB;  Service: Cardiovascular;  Laterality: N/A;   ESOPHAGOGASTRODUODENOSCOPY (EGD) WITH PROPOFOL N/A 11/20/2017   Procedure: ESOPHAGOGASTRODUODENOSCOPY (EGD) WITH PROPOFOL;  Surgeon: Hilarie Fredrickson, MD;  Location: [facility] ENDOSCOPY;  Service: Gastroenterology;  Laterality: N/A;   ESOPHAGOGASTRODUODENOSCOPY (EGD) WITH PROPOFOL N/A 11/28/2018   Procedure: ESOPHAGOGASTRODUODENOSCOPY (EGD) WITH PROPOFOL;  Surgeon: Malissa Hippo, MD;  [facility];  Service: Endoscopy;  Laterality: N/A;   ESOPHAGOGASTRODUODENOSCOPY (EGD) WITH PROPOFOL N/A 07/12/2019   Procedure: ESOPHAGOGASTRODUODENOSCOPY (EGD) WITH PROPOFOL;  Surgeon: Malissa Hippo, MD;  [facility];  Service: Endoscopy;  Laterality: N/A;  125   GIVENS CAPSULE STUDY N/A 12/17/2018   Procedure: GIVENS CAPSULE STUDY;  Surgeon: Malissa Hippo, MD;  [facility];  Service: Endoscopy;  Laterality: N/A;  7:30am   I & D EXTREMITY Right 12/17/2017   Procedure: IRRIGATION AND DEBRIDEMENT RIGHT GREAT TOE;  Surgeon: Nada Libman, MD;   Location: [facility];  Service: Vascular;  Laterality: Right;   INCISION AND DRAINAGE Right 12/17/2020   Procedure: INCISION AND DRAINAGE OF RIGHT FOOT;  Surgeon: Nada Libman, MD;  Location: [facility];  Service: Vascular;  Laterality: Right;   JOINT REPLACEMENT     LEFT HEART CATH AND CORONARY ANGIOGRAPHY N/A 05/18/2017   Procedure: LEFT HEART CATH AND CORONARY ANGIOGRAPHY;  Surgeon: Corky Crafts, MD;  Location: [facility] INVASIVE CV LAB;  Service: Cardiovascular;  Laterality: N/A;   LOWER EXTREMITY ANGIOGRAM Right 09/20/2017   Procedure: RIGHT LOWER LEG  ANGIOGRAM;  Surgeon: Nada Libman, MD;  [facility]: [facility];  Service: Vascular;  Laterality: Right;   LUMBAR FUSION  2002   L3, 4 L4, 5 L5 S1 Fused by Dr. Trey Sailors   PERIPHERAL VASCULAR BALLOON ANGIOPLASTY Left 09/15/2017   Procedure: PERIPHERAL VASCULAR BALLOON ANGIOPLASTY;  Surgeon: Nada Libman, MD;  [facility];  Service: Cardiovascular;  Laterality: Left;  PTA of Peroneal & Posterior Tibial  PERIPHERAL VASCULAR INTERVENTION Right 12/15/2020   Procedure: PERIPHERAL VASCULAR INTERVENTION;  Surgeon: Nada Libman, MD;  [facility];  Service: Cardiovascular;  Laterality: Right;  SFA   POLYPECTOMY  11/28/2018   Procedure: POLYPECTOMY;  Surgeon: Malissa Hippo, MD;  [facility];  Service: Endoscopy;;  duodenum   POSTERIOR LUMBAR FUSION     RHINOPLASTY     RIGHT HEART CATH N/A 05/18/2017   Procedure: RIGHT HEART CATH;  Surgeon: Corky Crafts, MD;  Location: [facility] INVASIVE CV LAB;  Service: Cardiovascular;  Laterality: N/A;   TEE WITHOUT CARDIOVERSION N/A 09/18/2017   Procedure: TRANSESOPHAGEAL ECHOCARDIOGRAM (TEE);  Surgeon: Pricilla Riffle, MD;  Location: [facility] ENDOSCOPY;  Service: Cardiovascular;  Laterality: N/A;   TOTAL KNEE ARTHROPLASTY Bilateral    TRANSMETATARSAL AMPUTATION Right 02/04/2020   Procedure: RIGHT TRANSMETATARSAL AMPUTATION;  Surgeon: Nada Libman, MD;  Location: [facility] OR;   Service: Vascular;  Laterality: Right;   TRANSMETATARSAL AMPUTATION Left 11/12/2021   Procedure: LEFT TRANSMETATARSAL AMPUTATION;  Surgeon: Maeola Harman, MD;  Location: [facility] OR;  Service: Vascular;  Laterality: Left;   TRANSURETHRAL RESECTION OF PROSTATE  2001   [place]    Social History:  reports that he quit smoking about 39 years ago. His smoking use included cigarettes. He started smoking about 69 years ago. He has a 60.00 pack-year smoking history. He has never used smokeless tobacco. He reports that he does not currently use alcohol. He reports that he does not use drugs.   Allergies  Allergen Reactions   Codeine Shortness Of Breath   Doxycycline Swelling    Swelling and numbness in lips and face. Swelling improved after stopping. Reports still experiences numbness in bottom lip.    Feraheme [Ferumoxytol] Other (See Comments)    Diaphoretic, chest pain   Heparin Other (See Comments)    +HIT,  Severe bleeding (with heparin drip & large doses), tolerates low doses   Iron Shortness Of Breath   Losartan Swelling   Oxycodone Other (See Comments)    "Made me act out of my mind" Mental status changes- hallucinations   Latex Rash    Family History  Problem Relation Age of Onset   Heart attack Father 65   COPD Father    COPD Mother    Heart disease Mother    Diabetes Mother    Hypertension Sister    CVA Sister    Diabetes Sister    Multiple sclerosis Sister      Prior to Admission medications   Medication Sig Start Date End Date Taking? Authorizing Provider  Accu-Chek Softclix Lancets lancets  02/19/20   [provider]  acetaminophen (TYLENOL) 500 MG tablet Take 1 tablet (500 mg total) by mouth every 6 (six) hours as needed for mild pain or headache. Patient taking differently: Take 1,000 mg by mouth every 6 (six) hours as needed for mild pain or headache. 03/03/19   Drema Dallas, MD  Alcohol Swabs (B-D SINGLE USE SWABS REGULAR) PADS  02/17/20    [provider]  amLODipine (NORVASC) 2.5 MG tablet Take 2.5 mg by mouth daily.    [provider]  apixaban (ELIQUIS) 2.5 MG TABS tablet Take 1 tablet (2.5 mg total) by mouth 2 (two) times daily. Patient taking differently: Take 5 mg by mouth 2 (two) times daily. 12/19/20   Baglia, Corrina, PA-C  atorvastatin (LIPITOR) 80 MG tablet Take 80 mg by mouth every evening.  05/23/19   [provider]  Blood Glucose Monitoring Suppl (ACCU-CHEK GUIDE) w/Device KIT  02/19/20   [provider]  Cholecalciferol (VITAMIN D3) 50 MCG (2000 UT) TABS Take 2,000 Units by mouth daily.    [provider]  clopidogrel (PLAVIX) 75 MG tablet Take 1 tablet (75 mg total) by mouth daily with breakfast. 12/19/20   Baglia, Corrina, PA-C  Cyanocobalamin (B-12) 2500 MCG TABS Take 2,500 mcg by mouth daily.    [provider]  DROPLET INSULIN SYRINGE 31G X 5/16" 1 ML MISC  09/24/19   [provider]  folic acid (FOLVITE) 1 MG tablet Take 1 tablet (1 mg total) by mouth daily. 12/06/17   Meredeth Ide, MD  furosemide (LASIX) 40 MG tablet TAKE 1 TABLET EVERY DAY AS NEEDED FOR EDEMA (SWELLING) 07/04/22   Antoine Poche, MD  insulin glargine (LANTUS) 100 UNIT/ML injection Inject 30 Units into the skin 2 (two) times daily as needed.    [provider]  ipratropium-albuterol (DUONEB) 0.5-2.5 (3) MG/3ML SOLN Take 3 mLs by nebulization every other day. 11/08/19   [provider]  isosorbide mononitrate (IMDUR) 60 MG 24 hr tablet TAKE 1 TABLET TWICE DAILY (DOSE INCREASE 10/26/21) 06/15/22   Antoine Poche, MD  nitroGLYCERIN (NITROSTAT) 0.4 MG SL tablet PLACE 1 TABLET (0.4 MG TOTAL) UNDER THE TONGUE EVERY 5  MINUTES AS NEEDED FOR CHEST PAIN. Patient taking differently: Place 0.4 mg under the tongue every 5 (five) minutes x 3 doses as needed for chest pain. 05/22/17   Antoine Poche, MD  OXYGEN Inhale 2 L/min into the lungs See admin instructions. 2 L/min of  oxygen at bedtime and during the day as needed for shortness of breath    [provider]  pantoprazole (PROTONIX) 40 MG tablet Take 1 tablet (40 mg total) by mouth 2 (two) times daily. To protect stomach while taking multiple blood thinners. Patient taking differently: Take 40 mg by mouth daily. To protect stomach while taking multiple blood thinners. 11/29/18   Vassie Loll, MD  potassium chloride SA (K-DUR) 20 MEQ tablet Take 1 tablet (20 mEq total) by mouth daily. 11/30/18   Vassie Loll, MD  SPIRIVA HANDIHALER 18 MCG inhalation capsule Place 1 capsule into inhaler and inhale daily after breakfast.  01/23/20   [provider]  traZODone (DESYREL) 100 MG tablet Take 200 mg by mouth at bedtime. 03/25/16   [provider]  TRUE METRIX BLOOD GLUCOSE TEST test strip  07/12/19   [provider]    Physical Exam: BP (!) 152/91   Pulse 61   Temp 98.1 F (36.7 C) (Oral)   Resp 20   Ht  (1.702 m)   Wt 92.3 kg   SpO2 93%   BMI 31.86 kg/m   General: 89 y.o. year-old male well developed well nourished in no acute distress.  Alert and oriented x3. HEENT: NCAT, EOMI Neck: Supple, trachea medial Cardiovascular: Regular rate and rhythm with no rubs or gallops.  No thyromegaly or JVD noted.  +2 lower extremity edema bilaterally. 2/4 pulses in all 4 extremities. Respiratory: Clear to auscultation with no wheezes or rales. Good inspiratory effort. Abdomen: Soft, nontender nondistended with normal bowel sounds x4 quadrants. Muskuloskeletal: Bilateral transmetatarsal amputation.  No cyanosis, clubbing noted bilaterally Neuro: CN II-XII intact, strength 5/5 x 4, sensation, reflexes intact Skin: No ulcerative lesions noted or rashes Psychiatry: Judgement and insight appear normal. Mood is appropriate for condition and setting          Labs  on Admission:  Basic Metabolic Panel: Recent Labs  Lab 07/18/22 1554  NA 138  K 4.1  CL 104  CO2 26  GLUCOSE 108*   BUN 22  CREATININE 1.50*  CALCIUM 8.3*  MG 2.2   Liver Function Tests: No results for input(s): "AST", "ALT", "ALKPHOS", "BILITOT", "PROT", "ALBUMIN" in the last 168 hours. No results for input(s): "LIPASE", "AMYLASE" in the last 168 hours. No results for input(s): "AMMONIA" in the last 168 hours. CBC: Recent Labs  Lab 07/18/22 1554  WBC 5.4  HGB 7.6*  HCT 26.6*  MCV 72.9*  PLT 106*   Cardiac Enzymes: No results for input(s): "CKTOTAL", "CKMB", "CKMBINDEX", "TROPONINI" in the last 168 hours.  BNP (last 3 results) Recent Labs    07/18/22 1554  BNP 332.0*    ProBNP (last 3 results) No results for input(s): "PROBNP" in the last 8760 hours.  CBG: Recent Labs  Lab 07/18/22 2229  GLUCAP 114*    Radiological Exams on Admission: DG Chest 2 View  Result Date: 07/18/2022 CLINICAL DATA:  swelling EXAM: CHEST - 2 VIEW COMPARISON:  Chest x-ray July 26, 2019. FINDINGS: Mild diffuse interstitial opacities. Small right pleural effusion. No visible pneumothorax. Enlarged cardiac silhouette. IMPRESSION: 1. Probable mild interstitial edema.  Small right pleural effusion. 2. Cardiomegaly. Electronically Signed   By: Feliberto Harts M.D.   On: 07/18/2022 15:58    EKG: I independently viewed the EKG done and my findings are as followed: Atrial fibrillation with rate controlled  Assessment/Plan Present on Admission:  Acute on chronic diastolic CHF (congestive heart failure)  Microcytic anemia  Coronary artery disease involving native coronary artery of native heart without angina pectoris  PAD (peripheral artery disease)  Essential hypertension  Principal Problem:   Acute on chronic diastolic CHF (congestive heart failure) Active Problems:   Essential hypertension   Type 2 diabetes mellitus   Microcytic anemia   Coronary artery disease involving native coronary artery of native heart without angina pectoris   PAD (peripheral artery disease)   Atrial fibrillation, chronic    Mixed hyperlipidemia  Acute on chronic diastolic CHF Continue total input/output, daily weights and fluid restriction Continue IV Lasix 60 mg twice daily Continue heart healthy diet  EKG personally reviewed showed atrial fibrillation with rate controlled Echocardiogram done on 11/11/2021 showed LVEF of 70 to 75%.  No RWMA.  Echocardiogram will be done in the morning Cardiology will be consulted and we shall await further recommendation  Chronic atrial fibrillation with rate control Continue Eliquis  Microcytic anemia MCV 72.9, iron studies will be done  CAD/PAD He Presented with ischemia with close in June 2019 which required intervention of lower extremity on the outside.  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 Continue Lipitor, Eliquis, Plavix  Mixed hyperlipidemia Continue Lipitor  Essential hypertension Continue amlodipine, Imdur  Type 2 diabetes mellitus Continue ISS and hypoglycemia protocol  DVT prophylaxis: Eliquis  Advance Care Planning: Full code  Consults: Cardiology  Family Communication: None at bedside  Severity of Illness: The appropriate patient status for this patient is INPATIENT. Inpatient status is judged to be reasonable and necessary in order to provide the required intensity of service to ensure the patient's safety. The patient's presenting symptoms, physical exam findings, and initial radiographic and laboratory data in the context of their chronic comorbidities is felt to place them at high risk for further clinical deterioration. Furthermore, it is not anticipated that the patient will be medically stable for discharge from the hospital within 2  midnights of admission.   * I certify that at the point of admission it is my clinical judgment that the patient will require inpatient hospital care spanning beyond 2 midnights from the point of admission due to high intensity of service, high risk for further deterioration and high  frequency of surveillance required.*  Author: Frankey Shown, DO 07/19/2022 2:43 AM  For on call review [email or web address].

## 2022-07-18 NOTE — Progress Notes (Signed)
Clinical Summary Mr. Schara is a 89 y.o.male seen today for focused visit on recent issues with diastolic HF.     1. Acute on chronic HFpEF - 10/2021 echo: LVEF 70-75%, indet diastolic, normal RV, severe LAE, mod AS AVA VTI 1.14 mean grad 25      - 04/2022 visit signs of fluid overload - lasix changed from prn to 40mg  daily.  - f/u labs Cr 1.32 to 1.4, prior range had been up to 1.8-2 8 months ago.  - phone call 07/14/22 with worsening edema, was to take lasix 40mg  bid over the weekend.    -weight by our scale today 203 lbs, in 04/2022 was 184 lbs - report from home scale 195 lbs. Usually low 180s.  - swelling bilateral legs, R>L.  - some abdominal distension,  +orthopnea, SOB/DOE     Other medical problems not addressed this visit   1. CAD with stable angina - nonobstructive CAD by cath Jan 2012, LVEF 60-65% by LV gram.  - 06/2015 nuclear stress without clear ischemia - 05/2015 echo LVEF 65-70%, no WMAs, cannot evaluate diastolic function  09/2016 lexiscan without ischemia, low risk study.    - cath 04/2017 as reported below. Received DES to 75% D1, DES to 75% mid to distal LAD, DES to 70% prox LAD RHC with CI 2.67, mean PA 25, no wedge reported by LVEDP 14.  - discharged on triple therapy with ASA, plavix, eliquis with plan for 30 days, then stop ASA.     - 11/22/17 admission with chest pain, anemia. Negative workup for ACS. Symptoms resolved with blood transfusion - 11/2017 admit with chest pain and dyspnea. ACS work negative, CT PE negative. Diuresed 6L with resolustion of symptoms. CXR with rib fracture thought playing a role in chest pain.     01/2020 nuclear stress: Small, mild intensity, mid to basal inferior/inferoseptal defect that is fixed and consistent with soft tissue attenuation in light of normal wall motion. No significant ischemic territories.       - as been on Eliquis and plavix due to high stent burden   - pressure like left sided. Can occur at rest  or with activity, 4-5/10 in severity. Can have some SOB. Not positional. Takes NG and symptoms improved. Occurs about once 1-2 weeks.    - mild infrequent chest pains, can have some SOB. -1-2 times per month - mild headaches at times, unclear if related to imdur.          3. Afib - admission 11/2018 with issues with afib - from notes had afib with slow rates, toprol was stopped   - no recent palpitatons   4.History of GI bleeding - GI bleed during 11/2018, stopped eliquis at that time, later restarted - heme +positive stools, negative endoscopy with plans for outpatient pill endoscopy    - dark stool 3 weeks ago that has resolved.    5. PAD - followed by vascular - admission 08/2017 with ischemia great toes. Had intervention on lower extremity vessels at that time.  -  ultimately required ampuation bilateral great toes   - admit 11/2017 for poor healing right great toe amputation, critical limb ischemia - admit 02/2019 with gangrene right second toe with nonhealing diabetic foot wound. S/p amputations - additional toe amputation 05/2019   - had repeat partial amputation 01/2020 - continues to follow with vascular, last appt 05/02/22     6. PSVT/Palpitations.  - no recent symptoms.    7.  Chronic anemia - has received multiple transfusions - followed by closely.    8. Hyperlipidemia - 09/2021 TC 71 TG 74 HDL 29 LDL 26 - he is on atorvastatin 80mg  daily.    9. Aortic stenosis - 10/2021 echo LVEF 70-75, mod AS Past Medical History:  Diagnosis Date   Anemia    a. mild, noted 04/2017.   CAD in native artery    a. [place] 04/2017 s/p DES to D1, DES to prox-mid LAD, DES to prox LAD overlapping the prior stent, LVEF 55-65%.    Chronic diastolic CHF (congestive heart failure)    Constipation    COPD (chronic obstructive pulmonary disease)    Diabetic ulcer of toe    DJD (degenerative joint disease) of cervical spine    Dysrhythmia    AFib   Essential hypertension    GERD  (gastroesophageal reflux disease)    History of hiatal hernia    HIT (heparin-induced thrombocytopenia)    Hypothyroidism    Hypoxia    a. went home on home O2 04/2017.   Insomnia    Mixed hyperlipidemia    PAD (peripheral artery disease)    PAF (paroxysmal atrial fibrillation)    PVD (peripheral vascular disease)    Renal insufficiency    Retinal hemorrhage    lost 90% of vision.   Retinitis    Sinus bradycardia    a. HR 30s-40s in 04/2017 -> diltiazem stopped, metoprolol reduced.   Sleep apnea    "chose not to order CPAP at this time" (05/18/2017)   Type 2 diabetes mellitus    Wears glasses    Wheezing    a. suspected COPD 04/2017. Former tobacco x 40 years.     Allergies  Allergen Reactions   Codeine Shortness Of Breath   Doxycycline Swelling    Swelling and numbness in lips and face. Swelling improved after stopping. Reports still experiences numbness in bottom lip.    Feraheme [Ferumoxytol] Other (See Comments)    Diaphoretic, chest pain   Heparin Other (See Comments)    +HIT,  Severe bleeding (with heparin drip & large doses), tolerates low doses   Iron Shortness Of Breath   Losartan Swelling   Oxycodone Other (See Comments)    "Made me act out of my mind" Mental status changes- hallucinations   Latex Rash     Current Outpatient Medications  Medication Sig Dispense Refill   Accu-Chek Softclix Lancets lancets      acetaminophen (TYLENOL) 500 MG tablet Take 1 tablet (500 mg total) by mouth every 6 (six) hours as needed for mild pain or headache. (Patient taking differently: Take 1,000 mg by mouth every 6 (six) hours as needed for mild pain or headache.) 30 tablet 0   Alcohol Swabs (B-D SINGLE USE SWABS REGULAR) PADS      amLODipine (NORVASC) 2.5 MG tablet Take 2.5 mg by mouth daily.     apixaban (ELIQUIS) 2.5 MG TABS tablet Take 1 tablet (2.5 mg total) by mouth 2 (two) times daily. (Patient taking differently: Take 5 mg by mouth 2 (two) times daily.) 60 tablet 11    atorvastatin (LIPITOR) 80 MG tablet Take 80 mg by mouth every evening.      Blood Glucose Monitoring Suppl (ACCU-CHEK GUIDE) w/Device KIT      Cholecalciferol (VITAMIN D3) 50 MCG (2000 UT) TABS Take 2,000 Units by mouth daily.     clopidogrel (PLAVIX) 75 MG tablet Take 1 tablet (75 mg total) by mouth daily with breakfast. 30  tablet 2   Cyanocobalamin (B-12) 2500 MCG TABS Take 2,500 mcg by mouth daily.     DROPLET INSULIN SYRINGE 31G X 5/16" 1 ML MISC      folic acid (FOLVITE) 1 MG tablet Take 1 tablet (1 mg total) by mouth daily. 30 tablet 1   furosemide (LASIX) 40 MG tablet TAKE 1 TABLET EVERY DAY AS NEEDED FOR EDEMA (SWELLING) 90 tablet 1   insulin glargine (LANTUS) 100 UNIT/ML injection Inject 30 Units into the skin 2 (two) times daily.     ipratropium-albuterol (DUONEB) 0.5-2.5 (3) MG/3ML SOLN Take 3 mLs by nebulization every other day.     isosorbide mononitrate (IMDUR) 60 MG 24 hr tablet TAKE 1 TABLET TWICE DAILY (DOSE INCREASE 10/26/21) 180 tablet 1   nitroGLYCERIN (NITROSTAT) 0.4 MG SL tablet PLACE 1 TABLET (0.4 MG TOTAL) UNDER THE TONGUE EVERY 5  MINUTES AS NEEDED FOR CHEST PAIN. (Patient taking differently: Place 0.4 mg under the tongue every 5 (five) minutes as needed for chest pain.) 25 tablet 3   nystatin (MYCOSTATIN) 100000 UNIT/ML suspension Take by mouth.     OXYGEN Inhale 2 L/min into the lungs See admin instructions. 2 L/min of oxygen at bedtime and during the day as needed for shortness of breath     pantoprazole (PROTONIX) 40 MG tablet Take 1 tablet (40 mg total) by mouth 2 (two) times daily. To protect stomach while taking multiple blood thinners. (Patient taking differently: Take 40 mg by mouth daily. To protect stomach while taking multiple blood thinners.) 60 tablet 2   potassium chloride SA (K-DUR) 20 MEQ tablet Take 1 tablet (20 mEq total) by mouth daily. 30 tablet 1   SPIRIVA HANDIHALER 18 MCG inhalation capsule Place 1 capsule into inhaler and inhale daily after breakfast.       traZODone (DESYREL) 100 MG tablet Take 150 mg by mouth at bedtime.     TRUE METRIX BLOOD GLUCOSE TEST test strip      No current facility-administered medications for this visit.     Past Surgical History:  Procedure Laterality Date   ABDOMINAL AORTOGRAM W/LOWER EXTREMITY N/A 09/15/2017   Procedure: ABDOMINAL AORTOGRAM W/LOWER EXTREMITY;  Surgeon: Nada Libman, MD;  Location: [facility];  Service: Cardiovascular;  Laterality: N/A;   ABDOMINAL AORTOGRAM W/LOWER EXTREMITY N/A 02/04/2020   Procedure: ABDOMINAL AORTOGRAM W/LOWER EXTREMITY;  Surgeon: Nada Libman, MD;  Location: [facility];  Service: Cardiovascular;  Laterality: N/A;   ABDOMINAL AORTOGRAM W/LOWER EXTREMITY N/A 12/15/2020   Procedure: ABDOMINAL AORTOGRAM W/LOWER EXTREMITY;  Surgeon: Nada Libman, MD;  Location: [facility];  Service: Cardiovascular;  Laterality: N/A;   AMPUTATION Bilateral 09/20/2017   Procedure: BILATERAL GREAT TOE AMPUTATIONS INCLUDING METATARSAL HEADS;  Surgeon: Nada Libman, MD;  Location: [facility] OR;  Service: Vascular;  Laterality: Bilateral;   AMPUTATION Right 03/01/2019   Procedure: AMPUTATION TOES;  Surgeon: Chuck Hint, MD;  Location: [facility] OR;  Service: Vascular;  Laterality: Right;   AMPUTATION Right 05/29/2019   Procedure: AMPUTATION RIGHT THIRD TOE;  Surgeon: Nada Libman, MD;  Location: [facility];  Service: Vascular;  Laterality: Right;   AMPUTATION Right 09/26/2019   Procedure: AMPUTATION RIGHT FOURTH TOE AND FIFTH TOES;  Surgeon: Nada Libman, MD;  Location: [facility];  Service: Vascular;  Laterality: Right;   ANGIOPLASTY Right 09/20/2017   Procedure: ANGIOPLASTY RIGHT TIBIAL ARTERY;  Surgeon: Nada Libman, MD;  Location: [facility];  Service: Vascular;  Laterality: Right;  APPENDECTOMY     APPLICATION OF WOUND VAC  12/17/2020   Procedure: APPLICATION OF WOUND VAC;  Surgeon: Nada Libman, MD;  Location: [facility];  Service: Vascular;;   BACK SURGERY      CARDIAC CATHETERIZATION  1980s; 2012;   CATARACT EXTRACTION Left 2004   COLONOSCOPY  2004   FLEISHMAN TICS   COLONOSCOPY WITH PROPOFOL N/A 11/28/2018   Procedure: COLONOSCOPY WITH PROPOFOL;  Surgeon: Malissa Hippo, MD;  [facility]: [facility];  Service: Endoscopy;  Laterality: N/A;   CORONARY ANGIOPLASTY WITH STENT PLACEMENT  05/18/2017   "3 stents"   CORONARY STENT INTERVENTION N/A 05/18/2017   Procedure: CORONARY STENT INTERVENTION;  Surgeon: Corky Crafts, MD;  Location: [facility] INVASIVE CV LAB;  Service: Cardiovascular;  Laterality: N/A;   ESOPHAGOGASTRODUODENOSCOPY (EGD) WITH PROPOFOL N/A 11/20/2017   Procedure: ESOPHAGOGASTRODUODENOSCOPY (EGD) WITH PROPOFOL;  Surgeon: Hilarie Fredrickson, MD;  Location: [facility] ENDOSCOPY;  Service: Gastroenterology;  Laterality: N/A;   ESOPHAGOGASTRODUODENOSCOPY (EGD) WITH PROPOFOL N/A 11/28/2018   Procedure: ESOPHAGOGASTRODUODENOSCOPY (EGD) WITH PROPOFOL;  Surgeon: Malissa Hippo, MD;  [facility];  Service: Endoscopy;  Laterality: N/A;   ESOPHAGOGASTRODUODENOSCOPY (EGD) WITH PROPOFOL N/A 07/12/2019   Procedure: ESOPHAGOGASTRODUODENOSCOPY (EGD) WITH PROPOFOL;  Surgeon: Malissa Hippo, MD;  [facility];  Service: Endoscopy;  Laterality: N/A;  125   GIVENS CAPSULE STUDY N/A 12/17/2018   Procedure: GIVENS CAPSULE STUDY;  Surgeon: Malissa Hippo, MD;  [facility]: [facility];  Service: Endoscopy;  Laterality: N/A;  7:30am   I & D EXTREMITY Right 12/17/2017   Procedure: IRRIGATION AND DEBRIDEMENT RIGHT GREAT TOE;  Surgeon: Nada Libman, MD;  Location: [facility];  Service: Vascular;  Laterality: Right;   INCISION AND DRAINAGE Right 12/17/2020   Procedure: INCISION AND DRAINAGE OF RIGHT FOOT;  Surgeon: Nada Libman, MD;  Location: [facility];  Service: Vascular;  Laterality: Right;   JOINT REPLACEMENT     LEFT HEART CATH AND CORONARY ANGIOGRAPHY N/A 05/18/2017   Procedure: LEFT HEART CATH AND CORONARY ANGIOGRAPHY;  Surgeon: Corky Crafts,  MD;  Location: [facility] INVASIVE CV LAB;  Service: Cardiovascular;  Laterality: N/A;   LOWER EXTREMITY ANGIOGRAM Right 09/20/2017   Procedure: RIGHT LOWER LEG  ANGIOGRAM;  Surgeon: Nada Libman, MD;  Location: [facility];  Service: Vascular;  Laterality: Right;   LUMBAR FUSION  2002   L3, 4 L4, 5 L5 S1 Fused by Dr. Trey Sailors   PERIPHERAL VASCULAR BALLOON ANGIOPLASTY Left 09/15/2017   Procedure: PERIPHERAL VASCULAR BALLOON ANGIOPLASTY;  Surgeon: Nada Libman, MD;  [facility];  Service: Cardiovascular;  Laterality: Left;  PTA of Peroneal & Posterior Tibial   PERIPHERAL VASCULAR INTERVENTION Right 12/15/2020   Procedure: PERIPHERAL VASCULAR INTERVENTION;  Surgeon: Nada Libman, MD;  [facility]: [facility];  Service: Cardiovascular;  Laterality: Right;  SFA   POLYPECTOMY  11/28/2018   Procedure: POLYPECTOMY;  Surgeon: Malissa Hippo, MD;  [facility];  Service: Endoscopy;;  duodenum   POSTERIOR LUMBAR FUSION     RHINOPLASTY     RIGHT HEART CATH N/A 05/18/2017   Procedure: RIGHT HEART CATH;  Surgeon: Corky Crafts, MD;  Location[facility] INVASIVE CV LAB;  Service: Cardiovascular;  Laterality: N/A;   TEE WITHOUT CARDIOVERSION N/A 09/18/2017   Procedure: TRANSESOPHAGEAL ECHOCARDIOGRAM (TEE);  Surgeon: Pricilla Riffle, MD;  Location: [facility] ENDOSCOPY;  Service: Cardiovascular;  Laterality: N/A;   TOTAL KNEE ARTHROPLASTY Bilateral  TRANSMETATARSAL AMPUTATION Right 02/04/2020   Procedure: RIGHT TRANSMETATARSAL AMPUTATION;  Surgeon: Nada Libman, MD;  Location: [facility] OR;  Service: Vascular;  Laterality: Right;   TRANSMETATARSAL AMPUTATION Left 11/12/2021   Procedure: LEFT TRANSMETATARSAL AMPUTATION;  Surgeon: Maeola Harman, MD;  Location: [facility] OR;  Service: Vascular;  Laterality: Left;   TRANSURETHRAL RESECTION OF PROSTATE  2001   Krishnan     Allergies  Allergen Reactions   Codeine Shortness Of Breath   Doxycycline Swelling    Swelling and numbness in lips  and face. Swelling improved after stopping. Reports still experiences numbness in bottom lip.    Feraheme [Ferumoxytol] Other (See Comments)    Diaphoretic, chest pain   Heparin Other (See Comments)    +HIT,  Severe bleeding (with heparin drip & large doses), tolerates low doses   Iron Shortness Of Breath   Losartan Swelling   Oxycodone Other (See Comments)    "Made me act out of my mind" Mental status changes- hallucinations   Latex Rash      Family History  Problem Relation Age of Onset   Heart attack Father 36   COPD Father    COPD Mother    Heart disease Mother    Diabetes Mother    Hypertension Sister    CVA Sister    Diabetes Sister    Multiple sclerosis Sister      Social History Mr. Gadbois reports that he quit smoking about 39 years ago. His smoking use included cigarettes. He started smoking about 69 years ago. He has a 60.00 pack-year smoking history. He has never used smokeless tobacco. Mr. Meacham reports that he does not currently use alcohol.   Review of Systems CONSTITUTIONAL: No weight loss, fever, chills, weakness or fatigue.  HEENT: Eyes: No visual loss, blurred vision, double vision or yellow sclerae.No hearing loss, sneezing, congestion, runny nose or sore throat.  SKIN: No rash or itching.  CARDIOVASCULAR: per hpi RESPIRATORY: per hpi GASTROINTESTINAL: No anorexia, nausea, vomiting or diarrhea. No abdominal pain or blood.  GENITOURINARY: No burning on urination, no polyuria NEUROLOGICAL: No headache, dizziness, syncope, paralysis, ataxia, numbness or tingling in the extremities. No change in bowel or bladder control.  MUSCULOSKELETAL: No muscle, back pain, joint pain or stiffness.  LYMPHATICS: No enlarged nodes. No history of splenectomy.  PSYCHIATRIC: No history of depression or anxiety.  ENDOCRINOLOGIC: No reports of sweating, cold or heat intolerance. No polyuria or polydipsia.  [name]   Physical Examination Today's Vitals   07/18/22 1323  BP:  (!) 140/60  Pulse: 60  SpO2: 92%  Weight: 203 lb 6.4 oz (92.3 kg)  Height:  (1.702 m)   Body mass index is 31.86 kg/m.  Gen: resting comfortably, no acute distress HEENT: no scleral icterus, pupils equal round and reactive, no palptable cervical adenopathy,  CV: RRR, 3/6 systolic murmur rusb, +JVD Resp: bilateral crackles GI: abdomen is soft, non-tender, non-distended, normal bowel sounds, no hepatosplenomegaly MSK: 2+ bialteral LE edema.  Skin: warm, no rash Neuro:  no focal deficits Psych: appropriate affect   Diagnostic Studies  05/2015 echo Study Conclusions   - Left ventricle: The cavity size was normal. Wall thickness was   increased increased in a pattern of mild to moderate LVH.   Systolic function was vigorous. The estimated ejection fraction   was in the range of 65% to 70%. Wall motion was normal; there   were no regional wall motion abnormalities. The study is not   technically sufficient to allow  evaluation of LV diastolic   function. - Aortic valve: Moderately calcified annulus. Mildly thickened   leaflets. There was mild stenosis. There was mild regurgitation.   Mean gradient (S): 13 mm Hg. Valve area (VTI): 1.51 cm^2. - Mitral valve: Mildly calcified annulus. Normal thickness leaflets   . - Left atrium: The atrium was moderately dilated. - Technically adequate study.     06/2015 Nuclear stress test No diagnostic ST segment changes to indicate ischemia. Small, mild intensity, perfusion defects noted in the apical anterior and basal inferolateral walls. This is most consistent with soft tissue attenuation given normal wall motion in these regions. No large ischemic zones noted. This is a low risk study. Nuclear stress EF: 64%.   05/2016 Event monitor Rhythm is atrial fibrillation throughout study. Occasioanal PVCs Min HR 51, Max HR 107, Avg HR 67 Reported symptoms correspond with rate controlled atrial fibrillation.   09/2016 nuclear stress There  was no ST segment deviation noted during stress. The study is normal. There are no perfusion defects consistent with prior infarct or current ischemia. This is a low risk study. The left ventricular ejection fraction is normal (55-65%).     04/2017 cath Dist RCA lesion is 40% stenosed. Prox RCA lesion is 25% stenosed. 1st Diag lesion is 75% stenosed. A drug-eluting stent was successfully placed using a STENT SYNERGY DES 2.25X12. Post intervention, there is a 0% residual stenosis. Prox LAD to Mid LAD lesion is 75% stenosed. A drug-eluting stent was successfully placed using a STENT SYNERGY DES 3.5X38. Post intervention, there is a 0% residual stenosis. Prox LAD lesion is 70% stenosed. A drug-eluting stent was successfully placed using a STENT SYNERGY DES 4X12, overlapping the prior stent. Post intervention, there is a 0% residual stenosis. The left ventricular systolic function is normal. The left ventricular ejection fraction is 55-65% by visual estimate. LV end diastolic pressure is normal. There is no aortic valve stenosis. Ao sat 98%, PA sat 65%; PA mean 25 mm Hg; unable to wedge the catheter. Normal right heat pressures. Tortuous right subclavian making catheter navigation difficult from the right radial approach.   Continue dual antiplatelet therapy along with Eliquis for 30 days.   Restart Eliquis tomorrow.   After 30 days, stop aspirin.  Continue Plavix and Eliquis.     After 6 months, can consider stopping clopidogrel if he has bleeding issues.  Given the nomber of stents, would try to complete one year of clopidogrel if no bleeding issues.    01/2020 nuclear stress Small, mild intensity, mid to basal inferior/inferoseptal defect that is fixed and consistent with soft tissue attenuation in light of normal wall motion. No significant ischemic territories. This is a low risk study. Nuclear stress EF: 63%.   Assessment and Plan  1.Acute on chronic HFpEF - severely volume  overloaded, by our scales up 19 lbs since February - too symptomatic and too much fluid to try to diurse at home.  - have recommended he present to [facility] ER for admission for acute on chronic HFpEF - would start with IV lasix  bid and titrate from there, will need repeat echo         Antoine Poche, M.D.

## 2022-07-19 ENCOUNTER — Other Ambulatory Visit (HOSPITAL_COMMUNITY): Payer: Self-pay | Admitting: *Deleted

## 2022-07-19 ENCOUNTER — Inpatient Hospital Stay (HOSPITAL_COMMUNITY): Payer: Medicare HMO

## 2022-07-19 DIAGNOSIS — I482 Chronic atrial fibrillation, unspecified: Secondary | ICD-10-CM | POA: Insufficient documentation

## 2022-07-19 DIAGNOSIS — I5031 Acute diastolic (congestive) heart failure: Secondary | ICD-10-CM

## 2022-07-19 DIAGNOSIS — E782 Mixed hyperlipidemia: Secondary | ICD-10-CM | POA: Insufficient documentation

## 2022-07-19 DIAGNOSIS — E7849 Other hyperlipidemia: Secondary | ICD-10-CM

## 2022-07-19 DIAGNOSIS — I4891 Unspecified atrial fibrillation: Secondary | ICD-10-CM

## 2022-07-19 DIAGNOSIS — I350 Nonrheumatic aortic (valve) stenosis: Secondary | ICD-10-CM

## 2022-07-19 DIAGNOSIS — I484 Atypical atrial flutter: Secondary | ICD-10-CM

## 2022-07-19 DIAGNOSIS — D649 Anemia, unspecified: Secondary | ICD-10-CM

## 2022-07-19 DIAGNOSIS — I2510 Atherosclerotic heart disease of native coronary artery without angina pectoris: Secondary | ICD-10-CM | POA: Diagnosis not present

## 2022-07-19 DIAGNOSIS — I5033 Acute on chronic diastolic (congestive) heart failure: Secondary | ICD-10-CM | POA: Diagnosis not present

## 2022-07-19 LAB — GLUCOSE, CAPILLARY
Glucose-Capillary: 100 mg/dL — ABNORMAL HIGH (ref 70–99)
Glucose-Capillary: 154 mg/dL — ABNORMAL HIGH (ref 70–99)
Glucose-Capillary: 123 mg/dL — ABNORMAL HIGH (ref 70–99)
Glucose-Capillary: 110 mg/dL — ABNORMAL HIGH (ref 70–99)

## 2022-07-19 LAB — COMPREHENSIVE METABOLIC PANEL
ALT: 10 U/L (ref 0–44)
AST: 14 U/L — ABNORMAL LOW (ref 15–41)
Albumin: 3.6 g/dL (ref 3.5–5.0)
Alkaline Phosphatase: 72 U/L (ref 38–126)
Anion gap: 9 (ref 5–15)
BUN: 20 mg/dL (ref 8–23)
CO2: 27 mmol/L (ref 22–32)
Calcium: 8.3 mg/dL — ABNORMAL LOW (ref 8.9–10.3)
Chloride: 103 mmol/L (ref 98–111)
Creatinine, Ser: 1.41 mg/dL — ABNORMAL HIGH (ref 0.61–1.24)
GFR, Estimated: 49 mL/min — ABNORMAL LOW (ref >60)
Glucose, Bld: 103 mg/dL — ABNORMAL HIGH (ref 70–99)
Potassium: 3.4 mmol/L — ABNORMAL LOW (ref 3.5–5.1)
Sodium: 139 mmol/L (ref 135–145)
Total Bilirubin: 1.3 mg/dL — ABNORMAL HIGH (ref 0.3–1.2)
Total Protein: 6 g/dL — ABNORMAL LOW (ref 6.5–8.1)

## 2022-07-19 LAB — CBC
HCT: 24.6 % — ABNORMAL LOW (ref 39.0–52.0)
Hemoglobin: 7.1 g/dL — ABNORMAL LOW (ref 13.0–17.0)
MCH: 20.9 pg — ABNORMAL LOW (ref 26.0–34.0)
MCHC: 28.9 g/dL — ABNORMAL LOW (ref 30.0–36.0)
MCV: 72.4 fL — ABNORMAL LOW (ref 80.0–100.0)
Platelets: 98 10*3/uL — ABNORMAL LOW (ref 150–400)
RBC: 3.4 MIL/uL — ABNORMAL LOW (ref 4.22–5.81)
RDW: 19.6 % — ABNORMAL HIGH (ref 11.5–15.5)
WBC: 4.6 10*3/uL (ref 4.0–10.5)
nRBC: 0 % (ref 0.0–0.2)

## 2022-07-19 LAB — ECHOCARDIOGRAM COMPLETE
AR max vel: 1.05 cm2
AV Area VTI: 1.13 cm2
AV Area mean vel: 1.05 cm2
AV Mean grad: 33 mmHg
AV Peak grad: 64.8 mmHg
Ao pk vel: 4.03 m/s
Area-P 1/2: 4.06 cm2
Height: 67 in
P 1/2 time: 469 msec
S' Lateral: 2.5 cm
Weight: 3118.19 oz

## 2022-07-19 LAB — FERRITIN: Ferritin: 9 ng/mL — ABNORMAL LOW (ref 24–336)

## 2022-07-19 LAB — IRON AND TIBC
Iron: 11 ug/dL — ABNORMAL LOW (ref 45–182)
Saturation Ratios: 3 % — ABNORMAL LOW (ref 17.9–39.5)
TIBC: 350 ug/dL (ref 250–450)
UIBC: 339 ug/dL

## 2022-07-19 LAB — PHOSPHORUS: Phosphorus: 4.4 mg/dL (ref 2.5–4.6)

## 2022-07-19 LAB — HEMOGLOBIN A1C
Hgb A1c MFr Bld: 6.3 % — ABNORMAL HIGH (ref 4.8–5.6)
Mean Plasma Glucose: 134.11 mg/dL

## 2022-07-19 LAB — MAGNESIUM: Magnesium: 2.1 mg/dL (ref 1.7–2.4)

## 2022-07-19 MED ORDER — INSULIN ASPART 100 UNIT/ML IJ SOLN
0.0000 [IU] | Freq: Three times a day (TID) | INTRAMUSCULAR | Status: DC
  Administered 2022-07-19: 2 [IU] via SUBCUTANEOUS
  Administered 2022-07-19: 3 [IU] via SUBCUTANEOUS
  Administered 2022-07-20 – 2022-07-23 (×6): 2 [IU] via SUBCUTANEOUS
  Administered 2022-07-23: 3 [IU] via SUBCUTANEOUS
  Administered 2022-07-24 – 2022-07-26 (×4): 2 [IU] via SUBCUTANEOUS
  Administered 2022-07-26: 3 [IU] via SUBCUTANEOUS

## 2022-07-19 MED ORDER — ACETAMINOPHEN 325 MG PO TABS
650.0000 mg | ORAL_TABLET | Freq: Four times a day (QID) | ORAL | Status: DC | PRN
  Administered 2022-07-23 – 2022-07-25 (×6): 650 mg via ORAL
  Filled 2022-07-19 (×6): qty 2

## 2022-07-19 MED ORDER — ONDANSETRON HCL 4 MG/2ML IJ SOLN
4.0000 mg | Freq: Four times a day (QID) | INTRAMUSCULAR | Status: DC | PRN
  Filled 2022-07-19: qty 2

## 2022-07-19 MED ORDER — ISOSORBIDE MONONITRATE ER 60 MG PO TB24: 60.0000 mg | ORAL_TABLET | Freq: Every day | ORAL | Status: DC

## 2022-07-19 MED ORDER — ACETAMINOPHEN 650 MG RE SUPP: 650.0000 mg | Freq: Four times a day (QID) | RECTAL | Status: DC | PRN

## 2022-07-19 MED ORDER — INSULIN ASPART 100 UNIT/ML IJ SOLN: 0.0000 [IU] | Freq: Every day | INTRAMUSCULAR | Status: DC

## 2022-07-19 MED ORDER — CLOPIDOGREL BISULFATE 75 MG PO TABS
75.0000 mg | ORAL_TABLET | Freq: Every day | ORAL | Status: DC
  Administered 2022-07-19 – 2022-07-27 (×9): 75 mg via ORAL
  Filled 2022-07-19 (×9): qty 1

## 2022-07-19 MED ORDER — AMLODIPINE BESYLATE 5 MG PO TABS
2.5000 mg | ORAL_TABLET | Freq: Every day | ORAL | Status: DC
  Administered 2022-07-19 – 2022-07-27 (×9): 2.5 mg via ORAL
  Filled 2022-07-19 (×9): qty 1

## 2022-07-19 MED ORDER — FERROUS SULFATE 325 (65 FE) MG PO TABS
325.0000 mg | ORAL_TABLET | Freq: Two times a day (BID) | ORAL | Status: DC
  Administered 2022-07-19 – 2022-07-20 (×3): 325 mg via ORAL
  Filled 2022-07-19 (×5): qty 1

## 2022-07-19 MED ORDER — POTASSIUM CHLORIDE CRYS ER 20 MEQ PO TBCR
40.0000 meq | EXTENDED_RELEASE_TABLET | Freq: Two times a day (BID) | ORAL | Status: AC
  Administered 2022-07-19 (×2): 40 meq via ORAL
  Filled 2022-07-19 (×2): qty 2

## 2022-07-19 MED ORDER — PANTOPRAZOLE SODIUM 40 MG PO TBEC
40.0000 mg | DELAYED_RELEASE_TABLET | Freq: Every day | ORAL | Status: DC
  Administered 2022-07-19 – 2022-07-25 (×7): 40 mg via ORAL
  Filled 2022-07-19 (×7): qty 1

## 2022-07-19 MED ORDER — PANTOPRAZOLE SODIUM 40 MG PO TBEC: 40.0000 mg | DELAYED_RELEASE_TABLET | Freq: Two times a day (BID) | ORAL | Status: DC

## 2022-07-19 MED ORDER — FUROSEMIDE 10 MG/ML IJ SOLN
60.0000 mg | Freq: Two times a day (BID) | INTRAMUSCULAR | Status: DC
  Administered 2022-07-19 – 2022-07-24 (×12): 60 mg via INTRAVENOUS
  Filled 2022-07-19 (×12): qty 6

## 2022-07-19 MED ORDER — APIXABAN 2.5 MG PO TABS
2.5000 mg | ORAL_TABLET | Freq: Two times a day (BID) | ORAL | Status: DC
  Administered 2022-07-19 – 2022-07-27 (×17): 2.5 mg via ORAL
  Filled 2022-07-19 (×17): qty 1

## 2022-07-19 MED ORDER — ATORVASTATIN CALCIUM 40 MG PO TABS
80.0000 mg | ORAL_TABLET | Freq: Every evening | ORAL | Status: DC
  Administered 2022-07-19 – 2022-07-23 (×5): 80 mg via ORAL
  Filled 2022-07-19 (×5): qty 2

## 2022-07-19 MED ORDER — ONDANSETRON HCL 4 MG PO TABS: 4.0000 mg | ORAL_TABLET | Freq: Four times a day (QID) | ORAL | Status: DC | PRN

## 2022-07-19 MED ORDER — ISOSORBIDE MONONITRATE ER 60 MG PO TB24
60.0000 mg | ORAL_TABLET | Freq: Two times a day (BID) | ORAL | Status: DC
  Administered 2022-07-19 – 2022-07-27 (×17): 60 mg via ORAL
  Filled 2022-07-19 (×17): qty 1

## 2022-07-19 NOTE — Progress Notes (Addendum)
PROGRESS NOTE    Mike Wright  WUJ:[id number] DOB: 1933/07/03 DOA: 07/18/2022 PCP: [name], MD   Brief Narrative:    Mike Wright is a 89 y.o. male with medical history significant of PAD, CAD, atrial fibrillation on Eliquis, diabetes mellitus who presents to the emergency department from cardiologist office.  Patient complained of 1 week onset of leg swelling which has extended to distention of the abdomen.  He was admitted for acute on chronic diastolic CHF exacerbation and started on IV Lasix for diuresis with cardiology consultation pending.  Assessment & Plan:   Principal Problem:   Acute on chronic diastolic CHF (congestive heart failure) Active Problems:   Essential hypertension   Type 2 diabetes mellitus   Microcytic anemia   Coronary artery disease involving native coronary artery of native heart without angina pectoris   PAD (peripheral artery disease)   Atrial fibrillation, chronic   Mixed hyperlipidemia  Assessment and Plan:   Acute on chronic diastolic CHF with acute on chronic hypoxemia 2 L baseline oxygen requirement at home Continue total input/output, daily weights and fluid restriction Continue IV Lasix 60 mg twice daily Continue heart healthy diet      EKG personally reviewed showed atrial fibrillation with rate controlled Echocardiogram done on 11/11/2021 showed LVEF of 70 to 75%.  No RWMA.  Echocardiogram will be done in the morning Cardiology will be consulted and we shall await further recommendation   Chronic atrial fibrillation with rate control Continue Eliquis   Microcytic anemia MCV 72.9, iron studies show deficiency, but patient is allergic to IV iron and therefore will be started on oral ferrous sulfate Transfuse for hemoglobin less than 7, monitor CBC in a.m. No overt bleeding noted   CAD/PAD He Presented with ischemia with close in June 2019 which required intervention of lower extremity on the outside.  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 Continue Lipitor, Eliquis, Plavix   Mixed hyperlipidemia Continue Lipitor   Essential hypertension Continue amlodipine, Imdur   Type 2 diabetes mellitus Continue ISS and hypoglycemia protocol  Obesity BMI 30.52    DVT prophylaxis:Eliquis Code Status: Full Family Communication: None at bedside Disposition Plan:  Status is: Inpatient Remains inpatient appropriate because: Need for IV medications.   Consultants:  Cardiology  Procedures:  None  Antimicrobials:  None   Subjective: Patient seen and evaluated today with no new acute complaints or concerns. No acute concerns or events noted overnight.  He continues to have ongoing shortness of breath.  Objective: Vitals:   07/18/22 2211 07/18/22 2217 07/19/22 0615 07/19/22 0643  BP: (!) 152/91   (!) 136/56  Pulse: 61   60  Resp: 20   20  Temp: 98.1 F (36.7 C)   98.5 F (36.9 C)  TempSrc: Oral   Oral  SpO2: 93%   100%  Weight:  89.6 kg 88.4 kg   Height:   (1.702 m)      Intake/Output Summary (Last 24 hours) at 07/19/2022 1155 Last data filed at 07/19/2022 1035 Gross per 24 hour  Intake 120 ml  Output 3775 ml  Net -3655 ml   Filed Weights   07/18/22 1522 07/18/22 2217 07/19/22 0615  Weight: 92.3 kg 89.6 kg 88.4 kg    Examination:  General exam: Appears calm and comfortable  Respiratory system: Clear to auscultation. Respiratory effort normal.  3-4 L nasal cannula Cardiovascular system: S1 & S2 heard, RRR.  Gastrointestinal system: Abdomen is soft Central nervous system: Alert and awake  Extremities: Bilateral lower extremity edema Skin: No significant lesions noted Psychiatry: Flat affect.    Data Reviewed: I have personally reviewed following labs and imaging studies  CBC: Recent Labs  Lab 07/18/22 1554 07/19/22 0427  WBC 5.4 4.6  HGB 7.6* 7.1*  HCT 26.6* 24.6*  MCV 72.9* 72.4*  PLT 106* 98*   Basic Metabolic Panel: Recent Labs  Lab 07/18/22 1554  07/19/22 0427  NA 138 139  K 4.1 3.4*  CL 104 103  CO2 26 27  GLUCOSE 108* 103*  BUN 22 20  CREATININE 1.50* 1.41*  CALCIUM 8.3* 8.3*  MG 2.2 2.1  PHOS  --  4.4   GFR: Estimated Creatinine Clearance: 39.9 mL/min (A) (by C-G formula based on SCr of 1.41 mg/dL (H)). Liver Function Tests: Recent Labs  Lab 07/19/22 0427  AST 14*  ALT 10  ALKPHOS 72  BILITOT 1.3*  PROT 6.0*  ALBUMIN 3.6   No results for input(s): "LIPASE", "AMYLASE" in the last 168 hours. No results for input(s): "AMMONIA" in the last 168 hours. Coagulation Profile: No results for input(s): "INR", "PROTIME" in the last 168 hours. Cardiac Enzymes: No results for input(s): "CKTOTAL", "CKMB", "CKMBINDEX", "TROPONINI" in the last 168 hours. BNP (last 3 results) No results for input(s): "PROBNP" in the last 8760 hours. HbA1C: Recent Labs    07/19/22 0427  HGBA1C 6.3*   CBG: Recent Labs  Lab 07/18/22 2229 07/19/22 0746  GLUCAP 114* 100*   Lipid Profile: No results for input(s): "CHOL", "HDL", "LDLCALC", "TRIG", "CHOLHDL", "LDLDIRECT" in the last 72 hours. Thyroid Function Tests: No results for input(s): "TSH", "T4TOTAL", "FREET4", "T3FREE", "THYROIDAB" in the last 72 hours. Anemia Panel: Recent Labs    07/19/22 0427  FERRITIN 9*  TIBC 350  IRON 11*   Sepsis Labs: No results for input(s): "PROCALCITON", "LATICACIDVEN" in the last 168 hours.  No results found for this or any previous visit (from the past 240 hour(s)).       Radiology Studies: DG Chest 2 View  Result Date: 07/18/2022 CLINICAL DATA:  swelling EXAM: CHEST - 2 VIEW COMPARISON:  Chest x-ray July 26, 2019. FINDINGS: Mild diffuse interstitial opacities. Small right pleural effusion. No visible pneumothorax. Enlarged cardiac silhouette. IMPRESSION: 1. Probable mild interstitial edema.  Small right pleural effusion. 2. Cardiomegaly. Electronically Signed   By: Feliberto Harts M.D.   On: 07/18/2022 15:58        Scheduled  Meds:  amLODipine  2.5 mg Oral Daily   apixaban  2.5 mg Oral BID   atorvastatin  80 mg Oral QPM   clopidogrel  75 mg Oral Q breakfast   ferrous sulfate  325 mg Oral BID WC   furosemide  60 mg Intravenous Q12H   insulin aspart  0-15 Units Subcutaneous TID WC   insulin aspart  0-5 Units Subcutaneous QHS   isosorbide mononitrate  60 mg Oral BID   pantoprazole  40 mg Oral Daily   potassium chloride  40 mEq Oral BID     LOS: 1 day    Time spent: 35 minutes    Keina Mutch Hoover Brunette, DO [facility]  If 7PM-7AM, please contact night-coverage [email or web address] 07/19/2022, 11:55 AM

## 2022-07-19 NOTE — TOC Initial Note (Signed)
Transition of Care [facility]) - Initial/Assessment Note    Patient Details  Name: Mike Wright MRN: [id number] Date of Birth: [birth year]  [facility] CM/SW Contact:    Leitha Bleak, RN Phone Number: 07/19/2022, 10:53 AM  Clinical Narrative:    Patient admitted with acute on chronic diastolic CHF. Patient has a high risk for readmission. CM spoke with patient, he drives occasionally, mostly his wife provides transportation. He has cane, walker, rollator to use as needed. He is active with [facility] for HHPT. MD aware to order. No other needs.             Expected Discharge Plan: Home w Home Health Services Barriers to Discharge: Continued Medical Work up   Patient Goals and CMS Choice Patient states their goals for this hospitalization and ongoing recovery are:: to go home. CMS Medicare.gov Compare Post Acute Care list provided to:: Patient Choice offered to / list presented to : Patient     Expected Discharge Plan and Services      Living arrangements for the past 2 months: Single Family Home                    HH Arranged: PT HH Agency: [facility]  Prior Living Arrangements/Services Living arrangements for the past 2 months: Single Family Home Lives with:: Spouse Patient language and need for interpreter reviewed:: Yes        Need for Family Participation in Patient Care: Yes (Comment) Care giver support system in place?: Yes (comment) Current home services: DME Criminal Activity/Legal Involvement Pertinent to Current Situation/Hospitalization: No - Comment as needed  Activities of Daily Living Home Assistive Devices/Equipment: Wheelchair, Medical laboratory scientific officer (specify quad or straight), Built-in shower seat ADL Screening (condition at time of admission) Patient's cognitive ability adequate to safely complete daily activities?: Yes Is the patient deaf or have difficulty hearing?: Yes Does the patient have difficulty seeing, even when wearing glasses/contacts?:  No Does the patient have difficulty concentrating, remembering, or making decisions?: No Patient able to express need for assistance with ADLs?: Yes Does the patient have difficulty dressing or bathing?: No Independently performs ADLs?: Yes (appropriate for developmental age) Does the patient have difficulty walking or climbing stairs?: Yes Weakness of Legs: Both Weakness of Arms/Hands: Both  Permission Sought/Granted     Emotional Assessment       Orientation: : Oriented to Self, Oriented to Place, Oriented to  Time, Oriented to Situation Alcohol / Substance Use: Not Applicable Psych Involvement: No (comment)  Admission diagnosis:  Acute on chronic diastolic CHF (congestive heart failure) [I50.33] Systolic congestive heart failure, unspecified HF chronicity [I50.20] Patient Active Problem List   Diagnosis Date Noted   Atrial fibrillation, chronic 07/19/2022   Mixed hyperlipidemia 07/19/2022   Acute on chronic diastolic CHF (congestive heart failure) 07/18/2022   Malnutrition of moderate degree 11/12/2021   Gangrene of left foot 11/10/2021   Acute kidney injury superimposed on chronic kidney disease 11/10/2021   Hypokalemia 02/03/2020   Cellulitis of right foot    Open wound of foot, right, initial encounter 02/02/2020   Open wound of foot with complication, initial encounter 02/02/2020   Acute urinary retention 07/27/2019   Anaphylaxis 07/26/2019   Gangrene of toe of right foot 02/28/2019   Gastrointestinal hemorrhage 12/10/2018   Orthostatic hypotension    Gastroesophageal reflux disease without esophagitis    Chronic respiratory failure with hypoxia    Bradycardia 11/26/2018   Abdominal pain, epigastric 11/26/2018   Acute blood  loss anemia 11/26/2018   PAD (peripheral artery disease) 12/13/2017   Non-healing surgical wound 12/13/2017   Infected surgical wound 12/13/2017   Paroxysmal atrial flutter    Dyspnea 12/02/2017   Pancytopenia 12/02/2017   Coronary artery  disease involving native coronary artery of native heart without angina pectoris 11/20/2017   Lower abdominal pain    Nausea without vomiting    Microcytic anemia 11/18/2017   Volume overload 11/18/2017   Chest pain 11/17/2017   Gangrene    Urinary retention 09/16/2017   MRSA bacteremia 09/15/2017   Gangrene of bilateral great toes (HCC) 09/13/2017   COPD (chronic obstructive pulmonary disease) 05/19/2017   Iron deficiency anemia due to chronic blood loss    CAD S/P percutaneous coronary angioplasty    Chronic diastolic CHF (congestive heart failure)    PSVT (paroxysmal supraventricular tachycardia)    PAF (paroxysmal atrial fibrillation)    Sleep apnea    Type 2 diabetes mellitus    Status post coronary artery stent placement    Essential hypertension 04/15/2010   Diabetes (HCC) 04/14/2010   PCP:  [name], MD Pharmacy:   [facility] - [place], [place] - [address] [place] [address] Phone: [phone] Fax: [phone]  [facility] [place], [place] - [address] [place] [address] Phone: [phone] Fax: [phone]     Social Determinants of Health (SDOH) Social History: SDOH Screenings   Food Insecurity: No Food Insecurity (07/18/2022)  Housing: Low Risk  (07/18/2022)  Transportation Needs: No Transportation Needs (07/18/2022)  Utilities: Not At Risk (07/18/2022)  Depression (PHQ2-9): Low Risk  (07/19/2021)  Financial Resource Strain: Low Risk  (05/23/2022)  Physical Activity: Inactive (07/18/2022)  Stress: No Stress Concern Present (07/29/2019)  Tobacco Use: Medium Risk (07/18/2022)   SDOH Interventions:    Readmission Risk Interventions    11/15/2021   10:56 AM 12/18/2020    2:00 PM 02/07/2020   12:11 PM  Readmission Risk Prevention Plan  Transportation Screening Complete Complete Complete  PCP or Specialist Appt within 5-7 Days  Complete   PCP or Specialist Appt within 3-5 Days  Complete  Complete  Home Care Screening  Complete   Medication Review (RN CM)  Complete   HRI or Home Care Consult Complete  Complete  Social Work Consult for Recovery Care Planning/Counseling Complete  Complete  Palliative Care Screening Complete  Not Applicable  Medication Review Oceanographer) Complete  Complete

## 2022-07-19 NOTE — Progress Notes (Signed)
*  PRELIMINARY RESULTS* Echocardiogram 2D Echocardiogram has been performed.  Stacey Drain 07/19/2022, 1:54 PM

## 2022-07-19 NOTE — Consult Note (Signed)
CARDIOLOGY CONSULT NOTE    Patient ID: Mike Wright; [id number]; 16-May-1933   Admit date: 07/18/2022 Date of Consult: 07/19/2022  Primary Care Provider: [name], MD Primary Cardiologist:  Primary Electrophysiologist:     Patient Profile:   Mike Wright is a 89 y.o. male with a hx of CAD s/p LAD PCI in 2019, atrial fibrillation, history of GI bleed, PAD manifested by CLI and ischemic great toes s/p amputation, chronic diastolic heart failure, chronic anemia received multiple blood transfusions, moderate aortic valve stenosis, HLD who is being seen today for the evaluation of acute on chronic HFpEF exacerbation at the request of Dr. Sherryll Burger.  History of Present Illness:   Mike Wright is a 89 year old M known to have CAD s/p LAD PCI in 2019, atrial fibrillation, history of GI bleed, PAD manifested by CLI and ischemic great toes s/p amputation, chronic diastolic heart failure, chronic anemia received multiple blood transfusions, moderate aortic valve stenosis, HLD was sent from cardiology clinic on 07/18/2022 for management of acute on chronic HFpEF exacerbation. Dry weight was 180s and patient weighed 203 pounds the ED in clinic on 07/18/2022. Patient has symptoms of DOE, orthopnea and PND associated with abdominal distention and bilateral lower EXTR swelling. He was started on IV Lasix 80 mg twice daily.  During my interview today, he is noticed to be in acute respiratory distress, tachypneic but is able to complete sentences fluently.  Past Medical History:  Diagnosis Date   Anemia    a. mild, noted 04/2017.   CAD in native artery    a. [place] 04/2017 s/p DES to D1, DES to prox-mid LAD, DES to prox LAD overlapping the prior stent, LVEF 55-65%.    Chronic diastolic CHF (congestive heart failure)    Constipation    COPD (chronic obstructive pulmonary disease)    Diabetic ulcer of toe    DJD (degenerative joint disease) of cervical spine    Dysrhythmia    AFib   Essential hypertension     GERD (gastroesophageal reflux disease)    History of hiatal hernia    HIT (heparin-induced thrombocytopenia)    Hypothyroidism    Hypoxia    a. went home on home O2 04/2017.   Insomnia    Mixed hyperlipidemia    PAD (peripheral artery disease)    PAF (paroxysmal atrial fibrillation)    PVD (peripheral vascular disease)    Renal insufficiency    Retinal hemorrhage    lost 90% of vision.   Retinitis    Sinus bradycardia    a. HR 30s-40s in 04/2017 -> diltiazem stopped, metoprolol reduced.   Sleep apnea    "chose not to order CPAP at this time" (05/18/2017)   Type 2 diabetes mellitus    Wears glasses    Wheezing    a. suspected COPD 04/2017. Former tobacco x 40 years.    Past Surgical History:  Procedure Laterality Date   ABDOMINAL AORTOGRAM W/LOWER EXTREMITY N/A 09/15/2017   Procedure: ABDOMINAL AORTOGRAM W/LOWER EXTREMITY;  Surgeon: Nada Libman, MD;  [facility]: [facility];  Service: Cardiovascular;  Laterality: N/A;   ABDOMINAL AORTOGRAM W/LOWER EXTREMITY N/A 02/04/2020   Procedure: ABDOMINAL AORTOGRAM W/LOWER EXTREMITY;  Surgeon: Nada Libman, MD;  Location: [facility];  Service: Cardiovascular;  Laterality: N/A;   ABDOMINAL AORTOGRAM W/LOWER EXTREMITY N/A 12/15/2020   Procedure: ABDOMINAL AORTOGRAM W/LOWER EXTREMITY;  Surgeon: Nada Libman, MD;  Location: [facility];  Service: Cardiovascular;  Laterality: N/A;  AMPUTATION Bilateral 09/20/2017   Procedure: BILATERAL GREAT TOE AMPUTATIONS INCLUDING METATARSAL HEADS;  Surgeon: Nada Libman, MD;  Location: [facility] OR;  Service: Vascular;  Laterality: Bilateral;   AMPUTATION Right 03/01/2019   Procedure: AMPUTATION TOES;  Surgeon: Chuck Hint, MD;  Location[facility] OR;  Service: Vascular;  Laterality: Right;   AMPUTATION Right 05/29/2019   Procedure: AMPUTATION RIGHT THIRD TOE;  Surgeon: Nada Libman, MD;  Location: [facility] OR;  Service: Vascular;  Laterality: Right;   AMPUTATION Right 09/26/2019    Procedure: AMPUTATION RIGHT FOURTH TOE AND FIFTH TOES;  Surgeon: Nada Libman, MD;  Location: [facility] OR;  Service: Vascular;  Laterality: Right;   ANGIOPLASTY Right 09/20/2017   Procedure: ANGIOPLASTY RIGHT TIBIAL ARTERY;  Surgeon: Nada Libman, MD;  Location: [facility] OR;  Service: Vascular;  Laterality: Right;   APPENDECTOMY     APPLICATION OF WOUND VAC  12/17/2020   Procedure: APPLICATION OF WOUND VAC;  Surgeon: Nada Libman, MD;  Location: [facility] OR;  Service: Vascular;;   BACK SURGERY     CARDIAC CATHETERIZATION  1980s; 2012;   CATARACT EXTRACTION Left 2004   COLONOSCOPY  2004   FLEISHMAN TICS   COLONOSCOPY WITH PROPOFOL N/A 11/28/2018   Procedure: COLONOSCOPY WITH PROPOFOL;  Surgeon: Malissa Hippo, MD;  Location: [facility];  Service: Endoscopy;  Laterality: N/A;   CORONARY ANGIOPLASTY WITH STENT PLACEMENT  05/18/2017   "3 stents"   CORONARY STENT INTERVENTION N/A 05/18/2017   Procedure: CORONARY STENT INTERVENTION;  Surgeon: Corky Crafts, MD;  Location: [facility] INVASIVE CV LAB;  Service: Cardiovascular;  Laterality: N/A;   ESOPHAGOGASTRODUODENOSCOPY (EGD) WITH PROPOFOL N/A 11/20/2017   Procedure: ESOPHAGOGASTRODUODENOSCOPY (EGD) WITH PROPOFOL;  Surgeon: Hilarie Fredrickson, MD;  Location: [facility] ENDOSCOPY;  Service: Gastroenterology;  Laterality: N/A;   ESOPHAGOGASTRODUODENOSCOPY (EGD) WITH PROPOFOL N/A 11/28/2018   Procedure: ESOPHAGOGASTRODUODENOSCOPY (EGD) WITH PROPOFOL;  Surgeon: Malissa Hippo, MD;  [facility]: [facility];  Service: Endoscopy;  Laterality: N/A;   ESOPHAGOGASTRODUODENOSCOPY (EGD) WITH PROPOFOL N/A 07/12/2019   Procedure: ESOPHAGOGASTRODUODENOSCOPY (EGD) WITH PROPOFOL;  Surgeon: Malissa Hippo, MD;  [facility]: [facility];  Service: Endoscopy;  Laterality: N/A;  125   GIVENS CAPSULE STUDY N/A 12/17/2018   Procedure: GIVENS CAPSULE STUDY;  Surgeon: Malissa Hippo, MD;  [facility]: [facility];  Service: Endoscopy;  Laterality: N/A;  7:30am   I & D EXTREMITY Right  12/17/2017   Procedure: IRRIGATION AND DEBRIDEMENT RIGHT GREAT TOE;  Surgeon: Nada Libman, MD;  Location: [facility];  Service: Vascular;  Laterality: Right;   INCISION AND DRAINAGE Right 12/17/2020   Procedure: INCISION AND DRAINAGE OF RIGHT FOOT;  Surgeon: Nada Libman, MD;  Location: [facility];  Service: Vascular;  Laterality: Right;   JOINT REPLACEMENT     LEFT HEART CATH AND CORONARY ANGIOGRAPHY N/A 05/18/2017   Procedure: LEFT HEART CATH AND CORONARY ANGIOGRAPHY;  Surgeon: Corky Crafts, MD;  Location: [facility] INVASIVE CV LAB;  Service: Cardiovascular;  Laterality: N/A;   LOWER EXTREMITY ANGIOGRAM Right 09/20/2017   Procedure: RIGHT LOWER LEG  ANGIOGRAM;  Surgeon: Nada Libman, MD;  Location: [facility] OR;  Service: Vascular;  Laterality: Right;   LUMBAR FUSION  2002   L3, 4 L4, 5 L5 S1 Fused by Dr. Trey Sailors   PERIPHERAL VASCULAR BALLOON ANGIOPLASTY Left 09/15/2017   Procedure: PERIPHERAL VASCULAR BALLOON ANGIOPLASTY;  Surgeon: Nada Libman, MD;  [facility]: [facility];  Service: Cardiovascular;  Laterality: Left;  PTA of Peroneal & Posterior Tibial   PERIPHERAL VASCULAR INTERVENTION Right 12/15/2020   Procedure: PERIPHERAL VASCULAR INTERVENTION;  Surgeon: Nada Libman, MD;  [facility]: [facility];  Service: Cardiovascular;  Laterality: Right;  SFA   POLYPECTOMY  11/28/2018   Procedure: POLYPECTOMY;  Surgeon: Malissa Hippo, MD;  [facility];  Service: Endoscopy;;  duodenum   POSTERIOR LUMBAR FUSION     RHINOPLASTY     RIGHT HEART CATH N/A 05/18/2017   Procedure: RIGHT HEART CATH;  Surgeon: Corky Crafts, MD;  Location: [facility] INVASIVE CV LAB;  Service: Cardiovascular;  Laterality: N/A;   TEE WITHOUT CARDIOVERSION N/A 09/18/2017   Procedure: TRANSESOPHAGEAL ECHOCARDIOGRAM (TEE);  Surgeon: Pricilla Riffle, MD;  Location: [facility] ENDOSCOPY;  Service: Cardiovascular;  Laterality: N/A;   TOTAL KNEE ARTHROPLASTY Bilateral    TRANSMETATARSAL AMPUTATION Right 02/04/2020    Procedure: RIGHT TRANSMETATARSAL AMPUTATION;  Surgeon: Nada Libman, MD;  Location: [facility] OR;  Service: Vascular;  Laterality: Right;   TRANSMETATARSAL AMPUTATION Left 11/12/2021   Procedure: LEFT TRANSMETATARSAL AMPUTATION;  Surgeon: Maeola Harman, MD;  Location: [facility] OR;  Service: Vascular;  Laterality: Left;   TRANSURETHRAL RESECTION OF PROSTATE  2001   Mike Wright     Inpatient Medications: Scheduled Meds:  amLODipine  2.5 mg Oral Daily   apixaban  2.5 mg Oral BID   atorvastatin  80 mg Oral QPM   clopidogrel  75 mg Oral Q breakfast   ferrous sulfate  325 mg Oral BID WC   furosemide  60 mg Intravenous Q12H   insulin aspart  0-15 Units Subcutaneous TID WC   insulin aspart  0-5 Units Subcutaneous QHS   isosorbide mononitrate  60 mg Oral BID   pantoprazole  40 mg Oral Daily   potassium chloride  40 mEq Oral BID   Continuous Infusions:  PRN Meds: acetaminophen **OR** acetaminophen, ondansetron **OR** ondansetron (ZOFRAN) IV  Allergies:    Allergies  Allergen Reactions   Codeine Shortness Of Breath   Doxycycline Swelling    Swelling and numbness in lips and face. Swelling improved after stopping. Reports still experiences numbness in bottom lip.    Feraheme [Ferumoxytol] Other (See Comments)    Diaphoretic, chest pain   Heparin Other (See Comments)    +HIT,  Severe bleeding (with heparin drip & large doses), tolerates low doses   Iron Shortness Of Breath   Losartan Swelling   Oxycodone Other (See Comments)    "Made me act out of my mind" Mental status changes- hallucinations   Latex Rash    Social History:   Social History   Socioeconomic History   Marital status: Married    Spouse name: patsy   Number of children: Not on file   Years of education: Not on file   Highest education level: Not on file  Occupational History   Not on file  Tobacco Use   Smoking status: Former    Packs/day: 2.00    Years: 30.00    Additional pack years: 0.00    Total pack  years: 60.00    Types: Cigarettes    Start date: 03/28/1953    Quit date: 03/29/1983    Years since quitting: 39.3   Smokeless tobacco: Never  Vaping Use   Vaping Use: Never used  Substance and Sexual Activity   Alcohol use: Not Currently    Comment: 05/18/2017 'I drink a beer q yr"   Drug use: No   Sexual activity: Not Currently  Other Topics Concern   Not on file  Social History Narrative   Patient lives at home with his spouse.    Social Determinants of Health   Financial Resource Strain: Low Risk  (05/23/2022)   Overall Financial Resource Strain (CARDIA)    Difficulty of Paying Living Expenses: Not hard at all  Food Insecurity: No Food Insecurity (07/18/2022)   Hunger Vital Sign    Worried About Running Out of Food in the Last Year: Never true    Ran Out of Food in the Last Year: Never true  Transportation Needs: No Transportation Needs (07/18/2022)   PRAPARE - Administrator, Civil Service (Medical): No    Lack of Transportation (Non-Medical): No  Physical Activity: Inactive (07/18/2022)   Exercise Vital Sign    Days of Exercise per Week: 0 days    Minutes of Exercise per Session: 0 min  Stress: No Stress Concern Present (07/29/2019)   [employer] of Occupational Health - Occupational Stress Questionnaire    Feeling of Stress : Not at all  Social Connections: Not on file  Intimate Partner Violence: Not At Risk (07/18/2022)   Humiliation, Afraid, Rape, and Kick questionnaire    Fear of Current or Ex-Partner: No    Emotionally Abused: No    Physically Abused: No    Sexually Abused: No    Family History:    Family History  Problem Relation Age of Onset   Heart attack Father 90   COPD Father    COPD Mother    Heart disease Mother    Diabetes Mother    Hypertension Sister    CVA Sister    Diabetes Sister    Multiple sclerosis Sister      ROS:  Please see the history of present illness.  ROS  All other ROS reviewed and negative.     Physical  Exam/Data:   Vitals:   07/18/22 2211 07/18/22 2217 07/19/22 0615 07/19/22 0643  BP: (!) 152/91   (!) 136/56  Pulse: 61   60  Resp: 20   20  Temp: 98.1 F (36.7 C)   98.5 F (36.9 C)  TempSrc: Oral   Oral  SpO2: 93%   100%  Weight:  89.6 kg 88.4 kg   Height:  5\' 7"  (1.702 m)      Intake/Output Summary (Last 24 hours) at 07/19/2022 1310 Last data filed at 07/19/2022 1035 Gross per 24 hour  Intake 120 ml  Output 3775 ml  Net -3655 ml   Filed Weights   07/18/22 1522 07/18/22 2217 07/19/22 0615  Weight: 92.3 kg 89.6 kg 88.4 kg   Body mass index is 30.52 kg/m.  General:  Well nourished, well developed, in acute distress HEENT: normal Lymph: no adenopathy Neck: JVD not examined Endocrine:  No thryomegaly Vascular: No carotid bruits; FA pulses 2+ bilaterally without bruits  Cardiac:  normal S1, S2; RRR; no murmur  Lungs:  clear to auscultation bilaterally, no wheezing, rhonchi or rales  Abd: soft, nontender, no hepatomegaly  Ext: 2+ pitting edema in bilateral lower extremities Musculoskeletal:  No deformities, BUE and BLE strength normal and equal Skin: warm and dry  Neuro:  CNs 2-12 intact, no focal abnormalities noted Psych:  Normal affect   EKG:  The EKG was personally reviewed and demonstrates: Atrial flutter with slow ventricular response Telemetry:  Telemetry was personally reviewed and demonstrates: Atrial flutter with slow response, HR 30-60s  Relevant CV Studies:   Laboratory Data:  Chemistry Recent  Labs  Lab 07/18/22 1554 07/19/22 0427  NA 138 139  K 4.1 3.4*  CL 104 103  CO2 26 27  GLUCOSE 108* 103*  BUN 22 20  CREATININE 1.50* 1.41*  CALCIUM 8.3* 8.3*  GFRNONAA 45* 49*  ANIONGAP 8 9    Recent Labs  Lab 07/19/22 0427  PROT 6.0*  ALBUMIN 3.6  AST 14*  ALT 10  ALKPHOS 72  BILITOT 1.3*   Hematology Recent Labs  Lab 07/18/22 1554 07/19/22 0427  WBC 5.4 4.6  RBC 3.65* 3.40*  HGB 7.6* 7.1*  HCT 26.6* 24.6*  MCV 72.9* 72.4*  MCH 20.8*  20.9*  MCHC 28.6* 28.9*  RDW 19.8* 19.6*  PLT 106* 98*   Cardiac EnzymesNo results for input(s): "TROPONINI" in the last 168 hours. No results for input(s): "TROPIPOC" in the last 168 hours.  BNP Recent Labs  Lab 07/18/22 1554  BNP 332.0*    DDimer No results for input(s): "DDIMER" in the last 168 hours.  Radiology/Studies:  DG Chest 2 View  Result Date: 07/18/2022 CLINICAL DATA:  swelling EXAM: CHEST - 2 VIEW COMPARISON:  Chest x-ray July 26, 2019. FINDINGS: Mild diffuse interstitial opacities. Small right pleural effusion. No visible pneumothorax. Enlarged cardiac silhouette. IMPRESSION: 1. Probable mild interstitial edema.  Small right pleural effusion. 2. Cardiomegaly. Electronically Signed   By: Feliberto Harts M.D.   On: 07/18/2022 15:58    Assessment and Plan:   Patient is 89 year old M known to have CAD s/p LAD PCI in 2019, atrial fibrillation, history of GI bleed, PAD manifested by CLI and ischemic great toes s/p amputation, chronic diastolic heart failure, chronic anemia received multiple blood transfusions, moderate aortic valve stenosis, HLD is currently admitted to hospitalist team for the management of acute on chronic HFpEF.  # Acute on chronic HFpEF: Patient had adequate urine output of 2.9L with IV Lasix 60 mg twice daily. Will continue IV Lasix 60 mg twice daily.  Follow-up on repeat echocardiogram. # CAD s/p LAD PCI in 2019, PAD: Continue Eliquis 2.5 mg twice daily and Plavix and 5 mg once daily, continue high intensity statin atorvastatin 80 mg nightly. Continue antianginal therapy with Imdur 60 mg twice daily. # Moderate aortic valve stenosis: Obtain 2D echocardiogram. # History of atrial fibrillation, atypical atrial flutter on EKG: EKG on admission showed atrial flutter with slow ventricular response. Telemetry showed atrial flutter with slow ventricle response, HR ranging between 35 and 60s. Patient will benefit from 2-week event monitor upon discharge to rule out  intermittent high-grade AVB in the setting of atrial flutter. Avoid AV nodal agents. # Chronic anemia: Patient will benefit from PRBC transfusion after he is near compensated. # HLD: Continue atorvastatin 80 mg nightly, goal LDL less than 55.  I have spent a total of 75 minutes with patient reviewing chart , telemetry, EKGs, labs and examining patient as well as establishing an assessment and plan that was discussed with the patient.  > 50% of time was spent in direct patient care.     For questions or updates, please contact [facility] Please consult [email or web address] for contact info under Cardiology/STEMI.   Signed, Herbert Deaner, MD 07/19/2022 1:10 PM

## 2022-07-19 NOTE — Care Plan (Signed)
Nursing student Burtis Junes discussed heart failure s/s, zones, prevention, and weight monitoring to patient and wife at bedside with instructor present. "Living Better with Heart Failure" book given and discussed.   Armanda Magic, MSN, RN Nursing Faculty [facility]

## 2022-07-20 ENCOUNTER — Telehealth: Payer: Self-pay

## 2022-07-20 DIAGNOSIS — I350 Nonrheumatic aortic (valve) stenosis: Secondary | ICD-10-CM | POA: Diagnosis not present

## 2022-07-20 DIAGNOSIS — I5033 Acute on chronic diastolic (congestive) heart failure: Secondary | ICD-10-CM | POA: Diagnosis not present

## 2022-07-20 DIAGNOSIS — I2510 Atherosclerotic heart disease of native coronary artery without angina pectoris: Secondary | ICD-10-CM | POA: Diagnosis not present

## 2022-07-20 DIAGNOSIS — D649 Anemia, unspecified: Secondary | ICD-10-CM | POA: Diagnosis not present

## 2022-07-20 LAB — GLUCOSE, CAPILLARY
Glucose-Capillary: 83 mg/dL (ref 70–99)
Glucose-Capillary: 148 mg/dL — ABNORMAL HIGH (ref 70–99)
Glucose-Capillary: 123 mg/dL — ABNORMAL HIGH (ref 70–99)
Glucose-Capillary: 148 mg/dL — ABNORMAL HIGH (ref 70–99)

## 2022-07-20 LAB — BASIC METABOLIC PANEL
Anion gap: 9 (ref 5–15)
BUN: 20 mg/dL (ref 8–23)
CO2: 27 mmol/L (ref 22–32)
Calcium: 8.2 mg/dL — ABNORMAL LOW (ref 8.9–10.3)
Chloride: 102 mmol/L (ref 98–111)
Creatinine, Ser: 1.45 mg/dL — ABNORMAL HIGH (ref 0.61–1.24)
GFR, Estimated: 47 mL/min — ABNORMAL LOW (ref >60)
Glucose, Bld: 103 mg/dL — ABNORMAL HIGH (ref 70–99)
Potassium: 3.8 mmol/L (ref 3.5–5.1)
Sodium: 138 mmol/L (ref 135–145)

## 2022-07-20 LAB — BPAM RBC: Unit Type and Rh: 5100

## 2022-07-20 LAB — TYPE AND SCREEN
ABO/RH(D): O POS
Unit division: 0

## 2022-07-20 LAB — CBC
HCT: 24.2 % — ABNORMAL LOW (ref 39.0–52.0)
Hemoglobin: 7 g/dL — ABNORMAL LOW (ref 13.0–17.0)
MCH: 20.6 pg — ABNORMAL LOW (ref 26.0–34.0)
MCHC: 28.9 g/dL — ABNORMAL LOW (ref 30.0–36.0)
MCV: 71.4 fL — ABNORMAL LOW (ref 80.0–100.0)
Platelets: 95 10*3/uL — ABNORMAL LOW (ref 150–400)
RBC: 3.39 MIL/uL — ABNORMAL LOW (ref 4.22–5.81)
RDW: 19.5 % — ABNORMAL HIGH (ref 11.5–15.5)
WBC: 5.1 10*3/uL (ref 4.0–10.5)
nRBC: 0 % (ref 0.0–0.2)

## 2022-07-20 LAB — PREPARE RBC (CROSSMATCH)

## 2022-07-20 LAB — MAGNESIUM: Magnesium: 2.1 mg/dL (ref 1.7–2.4)

## 2022-07-20 MED ORDER — SODIUM CHLORIDE 0.9% IV SOLUTION: Freq: Once | INTRAVENOUS | Status: AC

## 2022-07-20 MED ORDER — FUROSEMIDE 10 MG/ML IJ SOLN
40.0000 mg | Freq: Once | INTRAMUSCULAR | Status: AC
  Administered 2022-07-20: 40 mg via INTRAVENOUS
  Filled 2022-07-20: qty 4

## 2022-07-20 NOTE — Telephone Encounter (Signed)
Referral sent to [facility] per providers recommendation for Aortic Stenosis.

## 2022-07-20 NOTE — Progress Notes (Addendum)
EMERGENCY BLOOD PRODUCT NOTE  Compare the patient ID on the blood tag to the patient ID on the hospital armband and Blood Bank armband. Then confirm the unit number on the blood tag matches the unit number on the blood product.  If a discrepancy is discovered return the product to blood bank immediately.   Blood Product Type:Packed Red Blood Cells and Platelets (Pheresed)   Unit #: (Found on blood product bag, begins with W) [id number]   Product Code #: (Found on blood product bag, begins with E) [id number]  Start Time: 1412  Starting Rate: 120 ml/hr  Rate increase/decreased  (if applicable):      ml/hr  Rate changed time (if applicable):    Stop Time: 1715  Verified with Dellia Cloud, RN   All Other Documentation should be documented within the Blood Admin Flowsheet per policy.  Mike Wright D Mike Wright 2:10 PM

## 2022-07-20 NOTE — Progress Notes (Signed)
PROGRESS NOTE    Mike Wright  ZOX:[id number] DOB: 13-Jun-1933 DOA: 07/18/2022 PCP: [name], MD   Brief Narrative:    Mike Wright is a 89 y.o. male with medical history significant of PAD, CAD, atrial fibrillation on Eliquis, diabetes mellitus who presents to the emergency department from cardiologist office.  Patient complained of 1 week onset of leg swelling which has extended to distention of the abdomen.  He was admitted for acute on chronic diastolic CHF exacerbation and started on IV Lasix for diuresis with cardiology consultation pending.  Subjective: Mike Wright was seen and examined this morning, continues to have shortness of breath at rest, requiring 3 L of oxygen, satting 90%, shortness of breath gets worse with exertion mild tachypneic RR 21-23, BP 125/81, heart rate 62-1 04 hemoglobin dropped to 7.0  Otherwise awake alert oriented denies of any chest pain  Assessment & Plan:   Principal Problem:   Acute on chronic diastolic CHF (congestive heart failure) Active Problems:   Essential hypertension   Type 2 diabetes mellitus   Microcytic anemia   Coronary artery disease involving native coronary artery of native heart without angina pectoris   PAD (peripheral artery disease)   Atrial fibrillation, chronic   Mixed hyperlipidemia  Assessment and Plan:   Acute on chronic diastolic CHF with acute on chronic hypoxemia 2 L baseline oxygen requirement at home>>> currently requiring 3 L of oxygen, satting 93%  -Continue total input/output, daily weights and fluid restriction -Continue IV Lasix 60 mg twice daily -Continue heart healthy diet      -EKG personally reviewed showed atrial fibrillation with rate controlled -Echocardiogram done on 11/11/2021 showed LVEF of 70 to 75%.  No RWMA.   -Repeat echo 07/19/2022 reveals severe aortic valve stenosis  -Cardiology consulted, agreed with diuresing with IV Lasix 60 mg twice a day, Will receive an extra dose post 1 unit  of blood transfusion of 40 mg of Lasix   Chronic atrial fibrillation with rate control Continue Eliquis   Microcytic anemia MCV 72.9, iron studies show deficiency, but patient is allergic to IV iron and therefore will be started on oral ferrous sulfate    Latest Ref Rng & Units 07/20/2022    4:27 AM 07/19/2022    4:27 AM 07/18/2022    3:54 PM  CBC  WBC 4.0 - 10.5 K/uL 5.1  4.6  5.4   Hemoglobin 13.0 - 17.0 g/dL 7.0  7.1  7.6   Hematocrit 39.0 - 52.0 % 24.2  24.6  26.6   Platelets 150 - 400 K/uL 95  98  106    Hemoglobin at 7.0 this a.m. we will proceed with 2U PRBC transfusion slow transfusion with Lasix in between--given severe stenosis and CHF exacerbation No overt bleeding noted   CAD/PAD He Presented with ischemia with close in June 2019 which required intervention of lower extremity on the outside.  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 Continue Lipitor, Eliquis, Plavix, atorvastatin 80 mg, Imdur 60 mg twice daily - PCI in 2019   Mixed hyperlipidemia High intensity statin, atorvastatin 80 mg daily   Essential hypertension Continue amlodipine, Imdur   Type 2 diabetes mellitus Continue ISS and hypoglycemia protocol  Obesity BMI 30.52    DVT prophylaxis:Eliquis Code Status: Full Family Communication: None at bedside Disposition Plan:  Status is: Inpatient Remains inpatient appropriate because: Need for IV medications.   Consultants:  Cardiology  Procedures:  None  Antimicrobials:  None    Objective: Vitals:  07/19/22 2044 07/20/22 0407 07/20/22 0941 07/20/22 1007  BP: (!) 134/54 (!) 124/54 132/62 125/81  Pulse: (!) 44 62 62 62  Resp: 16 (!) 22 (!) 23 (!) 21  Temp:  98 F (36.7 C) 97.7 F (36.5 C) 98 F (36.7 C)  TempSrc:  Oral Oral   SpO2: 93% 91% 93%   Weight:  87.2 kg    Height:        Intake/Output Summary (Last 24 hours) at 07/20/2022 1219 Last data filed at 07/20/2022 1100 Gross per 24 hour  Intake 480 ml  Output 3700  ml  Net -3220 ml   Filed Weights   07/18/22 2217 07/19/22 0615 07/20/22 0407  Weight: 89.6 kg 88.4 kg 87.2 kg      General:  AAO x 3,  cooperative, no distress;   HEENT:  Normocephalic, PERRL, otherwise with in Normal limits   Neuro:  CNII-XII intact. , normal motor and sensation, reflexes intact   Lungs:   Clear to auscultation BL, Respirations unlabored,  No wheezes / crackles  Cardio:    S1/S2, RRR, No murmure, No Rubs or Gallops   Abdomen:  Soft, non-tender, bowel sounds active all four quadrants, no guarding or peritoneal signs.  Muscular  skeletal:  Limited exam -global generalized weaknesses - in bed, able to move all 4 extremities,   2+ pulses,  symmetric, No pitting edema  Skin:  Dry, warm to touch, negative for any Rashes,  Wounds: Please see nursing documentation      Data Reviewed: I have personally reviewed following labs and imaging studies  CBC: Recent Labs  Lab 07/18/22 1554 07/19/22 0427 07/20/22 0427  WBC 5.4 4.6 5.1  HGB 7.6* 7.1* 7.0*  HCT 26.6* 24.6* 24.2*  MCV 72.9* 72.4* 71.4*  PLT 106* 98* 95*   Basic Metabolic Panel: Recent Labs  Lab 07/18/22 1554 07/19/22 0427 07/20/22 0427  NA 138 139 138  K 4.1 3.4* 3.8  CL 104 103 102  CO2 26 27 27   GLUCOSE 108* 103* 103*  BUN 22 20 20   CREATININE 1.50* 1.41* 1.45*  CALCIUM 8.3* 8.3* 8.2*  MG 2.2 2.1 2.1  PHOS  --  4.4  --    GFR: Estimated Creatinine Clearance: 38.5 mL/min (A) (by C-G formula based on SCr of 1.45 mg/dL (H)). Liver Function Tests: Recent Labs  Lab 07/19/22 0427  AST 14*  ALT 10  ALKPHOS 72  BILITOT 1.3*  PROT 6.0*  ALBUMIN 3.6    Recent Labs    07/19/22 0427  HGBA1C 6.3*   CBG: Recent Labs  Lab 07/19/22 1227 07/19/22 1614 07/19/22 2041 07/20/22 0741 07/20/22 1132  GLUCAP 154* 123* 110* 83 148*    Recent Labs    07/19/22 0427  FERRITIN 9*  TIBC 350  IRON 11*   Sepsis Labs: No results for input(s): "PROCALCITON", "LATICACIDVEN" in the last 168  hours.  No results found for this or any previous visit (from the past 240 hour(s)).       Radiology Studies: ECHOCARDIOGRAM COMPLETE  Result Date: 07/19/2022    ECHOCARDIOGRAM REPORT   Patient Name:   Mike Wright Date of Exam: 07/19/2022 Medical Rec #:  [id number]       Height:       67.0 in Accession #:    [id number]      Weight:       194.9 lb Date of Birth:  [birth year]       BSA:  2.000 m Patient Age:    88 years        BP:           136/56 mmHg Patient Gender: M               HR:           69 bpm. [facility] Procedure: 2D Echo, Cardiac Doppler and Color Doppler Indications:    CHF-Acute Diastolic I50.31  History:        Patient has prior history of Echocardiogram examinations, most                 recent 11/11/2021. CHF, CAD, Arrythmias:Atrial Fibrillation,                 Bradycardia and Atrial Flutter; Risk Factors:Hypertension,                 Diabetes and Dyslipidemia. Sleep apnea,                 PAD (peripheral artery disease).  Sonographer:    Celesta Gentile RCS Referring Phys: [id number] OLADAPO ADEFESO IMPRESSIONS  1. Left ventricular ejection fraction, by estimation, is 70 to 75%. The left ventricle has hyperdynamic function. The left ventricle has no regional wall motion abnormalities. There is mild left ventricular hypertrophy. Left ventricular diastolic function could not be evaluated due to atrial flutter.  2. Right ventricular systolic function is normal. The right ventricular size is normal. There is severely elevated pulmonary artery systolic pressure. The estimated right ventricular systolic pressure is 73.1 mmHg.  3. Left atrial size was severely dilated.  4. Right atrial size was severely dilated.  5. The mitral valve is abnormal. No evidence of mitral valve regurgitation. No evidence of mitral stenosis. Severe mitral annular calcification.  6. The tricuspid valve is abnormal. Tricuspid valve regurgitation is mild to moderate.  7. The aortic valve is  calcified. Aortic valve regurgitation is mild. Moderate to severe aortic valve stenosis. Aortic valve area, by VTI measures 1.13 cm. Aortic valve mean gradient measures 33.0 mmHg. Aortic valve Vmax measures 4.03 m/s. AV DVI 0.33.  8. The inferior vena cava is dilated in size with <50% respiratory variability, suggesting right atrial pressure of 15 mmHg.  9. Increased flow velocities may be secondary to anemia, thyrotoxicosis, hyperdynamic or high flow state. Comparison(s): Changes from prior study are noted. AS worsened from moderate (DVI 0.33, Vmax 3.46 m/sec, mean PG 25 mm Hg) in 10/2021 to moderate-severe (DVI 0.33, Vmax 4.03 m/sec, mean PG 33 mm Hg) now. FINDINGS  Left Ventricle: Left ventricular ejection fraction, by estimation, is 70 to 75%. The left ventricle has hyperdynamic function. The left ventricle has no regional wall motion abnormalities. The left ventricular internal cavity size was normal in size. There is mild left ventricular hypertrophy. Left ventricular diastolic function could not be evaluated due to atrial fibrillation. Left ventricular diastolic function could not be evaluated. Right Ventricle: The right ventricular size is normal. No increase in right ventricular wall thickness. Right ventricular systolic function is normal. There is severely elevated pulmonary artery systolic pressure. The tricuspid regurgitant velocity is 3.81 m/s, and with an assumed right atrial pressure of 15 mmHg, the estimated right ventricular systolic pressure is 73.1 mmHg. Left Atrium: Left atrial size was severely dilated. Right Atrium: Right atrial size was severely dilated. Pericardium: There is no evidence of pericardial effusion. Mitral Valve: The mitral valve is abnormal. Severe mitral annular calcification. No evidence of mitral valve regurgitation.  No evidence of mitral valve stenosis. Tricuspid Valve: The tricuspid valve is abnormal. Tricuspid valve regurgitation is mild to moderate. No evidence of  tricuspid stenosis. Aortic Valve: The aortic valve is calcified. Aortic valve regurgitation is mild. Aortic regurgitation PHT measures 469 msec. Moderate to severe aortic stenosis is present. Aortic valve mean gradient measures 33.0 mmHg. Aortic valve peak gradient measures  64.8 mmHg. Aortic valve area, by VTI measures 1.13 cm. Pulmonic Valve: The pulmonic valve was normal in structure. Pulmonic valve regurgitation is trivial. No evidence of pulmonic stenosis. Aorta: The aortic root is normal in size and structure. Venous: The inferior vena cava is dilated in size with less than 50% respiratory variability, suggesting right atrial pressure of 15 mmHg. IAS/Shunts: No atrial level shunt detected by color flow Doppler.  LEFT VENTRICLE PLAX 2D LVIDd:         5.10 cm   Diastology LVIDs:         2.50 cm   LV e' medial:    6.74 cm/s LV PW:         1.30 cm   LV E/e' medial:  21.8 LV IVS:        1.10 cm   LV e' lateral:   8.38 cm/s LVOT diam:     2.00 cm   LV E/e' lateral: 17.5 LV SV:         101 LV SV Index:   51 LVOT Area:     3.14 cm  RIGHT VENTRICLE RV S prime:     11.90 cm/s TAPSE (M-mode): 2.0 cm LEFT ATRIUM              Index        RIGHT ATRIUM           Index LA diam:        4.90 cm  2.45 cm/m   RA Area:     32.20 cm LA Vol (A2C):   112.0 ml 56.00 ml/m  RA Volume:   120.00 ml 60.00 ml/m LA Vol (A4C):   115.0 ml 57.50 ml/m LA Biplane Vol: 121.0 ml 60.50 ml/m  AORTIC VALVE AV Area (Vmax):    1.05 cm AV Area (Vmean):   1.05 cm AV Area (VTI):     1.13 cm AV Vmax:           402.50 cm/s AV Vmean:          260.000 cm/s AV VTI:            0.900 m AV Peak Grad:      64.8 mmHg AV Mean Grad:      33.0 mmHg LVOT Vmax:         134.00 cm/s LVOT Vmean:        86.700 cm/s LVOT VTI:          0.323 m LVOT/AV VTI ratio: 0.36 AI PHT:            469 msec  AORTA Ao Root diam: 3.20 cm MITRAL VALVE                TRICUSPID VALVE MV Area (PHT): 4.06 cm     TR Peak grad:   58.1 mmHg MV Decel Time: 187 msec     TR Vmax:         381.00 cm/s MV E velocity: 147.00 cm/s MV A velocity: 67.00 cm/s   SHUNTS MV E/A ratio:  2.19         Systemic VTI:  0.32  m                             Systemic Diam: 2.00 cm Vishnu Priya Mallipeddi Electronically signed by Winfield Rast Mallipeddi Signature Date/Time: 07/19/2022/2:48:15 PM    Final    DG Chest 2 View  Result Date: 07/18/2022 CLINICAL DATA:  swelling EXAM: CHEST - 2 VIEW COMPARISON:  Chest x-ray July 26, 2019. FINDINGS: Mild diffuse interstitial opacities. Small right pleural effusion. No visible pneumothorax. Enlarged cardiac silhouette. IMPRESSION: 1. Probable mild interstitial edema.  Small right pleural effusion. 2. Cardiomegaly. Electronically Signed   By: Feliberto Harts M.D.   On: 07/18/2022 15:58        Scheduled Meds:  amLODipine  2.5 mg Oral Daily   apixaban  2.5 mg Oral BID   atorvastatin  80 mg Oral QPM   clopidogrel  75 mg Oral Q breakfast   ferrous sulfate  325 mg Oral BID WC   furosemide  40 mg Intravenous Once   furosemide  60 mg Intravenous Q12H   insulin aspart  0-15 Units Subcutaneous TID WC   insulin aspart  0-5 Units Subcutaneous QHS   isosorbide mononitrate  60 mg Oral BID   pantoprazole  40 mg Oral Daily     LOS: 2 days    Time spent: 35 minutes    Kendell Bane, MD [facility]  If 7PM-7AM, please contact night-coverage [email or web address] 07/20/2022, 12:19 PM

## 2022-07-20 NOTE — Telephone Encounter (Signed)
-----   Message from [name], [place] sent at 07/20/2022  9:31 AM EDT ----- Regarding: Referral to [facility] Good morning,   This patient needs a referral to [facility] for Aortic Stenosis per Dr. Jenene Slicker.   Thank you!  Best,  [place]

## 2022-07-20 NOTE — Progress Notes (Addendum)
Progress Note  Patient Name: Mike Wright Date of Encounter: 07/20/2022  Primary Cardiologist: Dina Rich, MD  Subjective   No acute events overnight.  His symptoms are improving but not back to baseline.  Inpatient Medications    Scheduled Meds:  amLODipine  2.5 mg Oral Daily   apixaban  2.5 mg Oral BID   atorvastatin  80 mg Oral QPM   clopidogrel  75 mg Oral Q breakfast   ferrous sulfate  325 mg Oral BID WC   furosemide  60 mg Intravenous Q12H   insulin aspart  0-15 Units Subcutaneous TID WC   insulin aspart  0-5 Units Subcutaneous QHS   isosorbide mononitrate  60 mg Oral BID   pantoprazole  40 mg Oral Daily   Continuous Infusions:  PRN Meds: acetaminophen **OR** acetaminophen, ondansetron **OR** ondansetron (ZOFRAN) IV   Vital Signs    Vitals:   07/19/22 2044 07/20/22 0407 07/20/22 0941 07/20/22 1007  BP: (!) 134/54 (!) 124/54 132/62 125/81  Pulse: (!) 44 62 62 62  Resp: 16 (!) 22 (!) 23 (!) 21  Temp:  98 F (36.7 C) 97.7 F (36.5 C) 98 F (36.7 C)  TempSrc:  Oral Oral   SpO2: 93% 91% 93%   Weight:  87.2 kg    Height:        Intake/Output Summary (Last 24 hours) at 07/20/2022 1032 Last data filed at 07/20/2022 0900 Gross per 24 hour  Intake 600 ml  Output 3800 ml  Net -3200 ml   Filed Weights   07/18/22 2217 07/19/22 0615 07/20/22 0407  Weight: 89.6 kg 88.4 kg 87.2 kg    Telemetry     Personally reviewed, atrial flutter with slow ventricle response, 50-60s  ECG    None performed today  Physical Exam   GEN: No acute distress.   Neck: JVD is not examined due to body habitus Cardiac: Irregular rate and rhythm, ejection systolic murmur, absent S2 Respiratory: Mildly labored, diffuse wheezing is present GI: Soft, nontender, bowel sounds present. MS: No edema; No deformity. Neuro:  Nonfocal. Psych: Alert and oriented x 3. Normal affect.  Labs    Chemistry Recent Labs  Lab 07/18/22 1554 07/19/22 0427 07/20/22 0427  NA 138  139 138  K 4.1 3.4* 3.8  CL 104 103 102  CO2 GLUCOSE 108* 103* 103*  BUN CREATININE 1.50* 1.41* 1.45*  CALCIUM 8.3* 8.3* 8.2*  PROT  --  6.0*  --   ALBUMIN  --  3.6  --   AST  --  14*  --   ALT  --  10  --   ALKPHOS  --  72  --   BILITOT  --  1.3*  --   GFRNONAA 45* 49* 47*  ANIONGAP Hematology Recent Labs  Lab 07/18/22 1554 07/19/22 0427 07/20/22 0427  WBC 5.4 4.6 5.1  RBC 3.65* 3.40* 3.39*  HGB 7.6* 7.1* 7.0*  HCT 26.6* 24.6* 24.2*  MCV 72.9* 72.4* 71.4*  MCH 20.8* 20.9* 20.6*  MCHC 28.6* 28.9* 28.9*  RDW 19.8* 19.6* 19.5*  PLT 106* 98* 95*    Cardiac EnzymesNo results for input(s): "TROPONINIHS" in the last 720 hours.  BNP Recent Labs  Lab 07/18/22 1554  BNP 332.0*     DDimerNo results for input(s): "DDIMER" in the last 168 hours.   Radiology    ECHOCARDIOGRAM COMPLETE  Result Date: 07/19/2022  ECHOCARDIOGRAM REPORT   Patient Name:   Mike Wright Date of Exam: 07/19/2022 Medical Rec #:  [id number]       Height:       67.0 in Accession #:    [id number]      Weight:       194.9 lb Date of Birth:  01/16/1934       BSA:          2.000 m Patient Age:    89 years        BP:           136/56 mmHg Patient Gender: M               HR:           69 bpm. [facility] Procedure: 2D Echo, Cardiac Doppler and Color Doppler Indications:    CHF-Acute Diastolic I50.31  History:        Patient has prior history of Echocardiogram examinations, most                 recent 11/11/2021. CHF, CAD, Arrythmias:Atrial Fibrillation,                 Bradycardia and Atrial Flutter; Risk Factors:Hypertension,                 Diabetes and Dyslipidemia. Sleep apnea,                 PAD (peripheral artery disease).  Sonographer:    Celesta Gentile RCS Referring Phys: [id number] OLADAPO ADEFESO IMPRESSIONS  1. Left ventricular ejection fraction, by estimation, is 70 to 75%. The left ventricle has hyperdynamic function. The left ventricle has no regional wall  motion abnormalities. There is mild left ventricular hypertrophy. Left ventricular diastolic function could not be evaluated due to atrial flutter.  2. Right ventricular systolic function is normal. The right ventricular size is normal. There is severely elevated pulmonary artery systolic pressure. The estimated right ventricular systolic pressure is 73.1 mmHg.  3. Left atrial size was severely dilated.  4. Right atrial size was severely dilated.  5. The mitral valve is abnormal. No evidence of mitral valve regurgitation. No evidence of mitral stenosis. Severe mitral annular calcification.  6. The tricuspid valve is abnormal. Tricuspid valve regurgitation is mild to moderate.  7. The aortic valve is calcified. Aortic valve regurgitation is mild. Moderate to severe aortic valve stenosis. Aortic valve area, by VTI measures 1.13 cm. Aortic valve mean gradient measures 33.0 mmHg. Aortic valve Vmax measures 4.03 m/s. AV DVI 0.33.  8. The inferior vena cava is dilated in size with <50% respiratory variability, suggesting right atrial pressure of 15 mmHg.  9. Increased flow velocities may be secondary to anemia, thyrotoxicosis, hyperdynamic or high flow state. Comparison(s): Changes from prior study are noted. AS worsened from moderate (DVI 0.33, Vmax 3.46 m/sec, mean PG 25 mm Hg) in 10/2021 to moderate-severe (DVI 0.33, Vmax 4.03 m/sec, mean PG 33 mm Hg) now. FINDINGS  Left Ventricle: Left ventricular ejection fraction, by estimation, is 70 to 75%. The left ventricle has hyperdynamic function. The left ventricle has no regional wall motion abnormalities. The left ventricular internal cavity size was normal in size. There is mild left ventricular hypertrophy. Left ventricular diastolic function could not be evaluated due to atrial fibrillation. Left ventricular diastolic function could not be evaluated. Right Ventricle: The right ventricular size is normal. No increase in right ventricular wall thickness. Right  ventricular systolic function  is normal. There is severely elevated pulmonary artery systolic pressure. The tricuspid regurgitant velocity is 3.81 m/s, and with an assumed right atrial pressure of 15 mmHg, the estimated right ventricular systolic pressure is 73.1 mmHg. Left Atrium: Left atrial size was severely dilated. Right Atrium: Right atrial size was severely dilated. Pericardium: There is no evidence of pericardial effusion. Mitral Valve: The mitral valve is abnormal. Severe mitral annular calcification. No evidence of mitral valve regurgitation. No evidence of mitral valve stenosis. Tricuspid Valve: The tricuspid valve is abnormal. Tricuspid valve regurgitation is mild to moderate. No evidence of tricuspid stenosis. Aortic Valve: The aortic valve is calcified. Aortic valve regurgitation is mild. Aortic regurgitation PHT measures 469 msec. Moderate to severe aortic stenosis is present. Aortic valve mean gradient measures 33.0 mmHg. Aortic valve peak gradient measures  64.8 mmHg. Aortic valve area, by VTI measures 1.13 cm. Pulmonic Valve: The pulmonic valve was normal in structure. Pulmonic valve regurgitation is trivial. No evidence of pulmonic stenosis. Aorta: The aortic root is normal in size and structure. Venous: The inferior vena cava is dilated in size with less than 50% respiratory variability, suggesting right atrial pressure of 15 mmHg. IAS/Shunts: No atrial level shunt detected by color flow Doppler.  LEFT VENTRICLE PLAX 2D LVIDd:         5.10 cm   Diastology LVIDs:         2.50 cm   LV e' medial:    6.74 cm/s LV PW:         1.30 cm   LV E/e' medial:  21.8 LV IVS:        1.10 cm   LV e' lateral:   8.38 cm/s LVOT diam:     2.00 cm   LV E/e' lateral: 17.5 LV SV:         101 LV SV Index:   51 LVOT Area:     3.14 cm  RIGHT VENTRICLE RV S prime:     11.90 cm/s TAPSE (M-mode): 2.0 cm LEFT ATRIUM              Index        RIGHT ATRIUM           Index LA diam:        4.90 cm  2.45 cm/m   RA Area:      32.20 cm LA Vol (A2C):   112.0 ml 56.00 ml/m  RA Volume:   120.00 ml 60.00 ml/m LA Vol (A4C):   115.0 ml 57.50 ml/m LA Biplane Vol: 121.0 ml 60.50 ml/m  AORTIC VALVE AV Area (Vmax):    1.05 cm AV Area (Vmean):   1.05 cm AV Area (VTI):     1.13 cm AV Vmax:           402.50 cm/s AV Vmean:          260.000 cm/s AV VTI:            0.900 m AV Peak Grad:      64.8 mmHg AV Mean Grad:      33.0 mmHg LVOT Vmax:         134.00 cm/s LVOT Vmean:        86.700 cm/s LVOT VTI:          0.323 m LVOT/AV VTI ratio: 0.36 AI PHT:            469 msec  AORTA Ao Root diam: 3.20 cm MITRAL VALVE  TRICUSPID VALVE MV Area (PHT): 4.06 cm     TR Peak grad:   58.1 mmHg MV Decel Time: 187 msec     TR Vmax:        381.00 cm/s MV E velocity: 147.00 cm/s MV A velocity: 67.00 cm/s   SHUNTS MV E/A ratio:  2.19         Systemic VTI:  0.32 m                             Systemic Diam: 2.00 cm Colie Josten Priya Anthem Frazer Electronically signed by Winfield Rast Savva Beamer Signature Date/Time: 07/19/2022/2:48:15 PM    Final    DG Chest 2 View  Result Date: 07/18/2022 CLINICAL DATA:  swelling EXAM: CHEST - 2 VIEW COMPARISON:  Chest x-ray July 26, 2019. FINDINGS: Mild diffuse interstitial opacities. Small right pleural effusion. No visible pneumothorax. Enlarged cardiac silhouette. IMPRESSION: 1. Probable mild interstitial edema.  Small right pleural effusion. 2. Cardiomegaly. Electronically Signed   By: Feliberto Harts M.D.   On: 07/18/2022 15:58    Cardiac Studies      Assessment & Plan   Patient is 89 year old M known to have CAD s/p LAD PCI in 2019, atrial fibrillation, history of GI bleed, PAD manifested by CLI and ischemic great toes s/p amputation, chronic diastolic heart failure, chronic anemia received multiple blood transfusions, moderate aortic valve stenosis, HLD is currently admitted to hospitalist team for the management of acute on chronic HFpEF.   # Acute on chronic HFpEF: Patient had adequate urine output of  3.8L with net -3.4 L in the last 24 hours with the current IV Lasix.  Will continue IV Lasix 60 mg twice daily. # Moderate to severe aortic valve stenosis: Echocardiogram from 07/19/2022 showed aortic valve V-max of 4.03 m/s consistent with severe aortic valve stenosis but the mean pressure gradient 33 mmHg, aortic valve area 1.13 cm and aortic valve DVI 0.33 are consistent with moderate aortic valve stenosis.  Physical examination showed absent S2, consistent with severe aortic valve stenosis. Ambulatory referral to structural heart clinic was placed. # CAD s/p LAD PCI in 2019, PAD: Continue Eliquis 2.5 mg twice daily and Plavix and 5 mg once daily, continue high intensity statin atorvastatin 80 mg nightly. Continue antianginal therapy with Imdur 60 mg twice daily.  # History of atrial fibrillation, atypical atrial flutter on EKG: EKG on admission showed atrial flutter with slow ventricular response. Telemetry showed atrial flutter with slow ventricle response, HR ranging between 50 and 60s. Patient will benefit from 2-week event monitor upon discharge to rule out intermittent high-grade AVB in the setting of atrial flutter. Avoid AV nodal agents. # Chronic anemia: Patient will benefit from PRBC transfusion after he is near compensated, keep hemoglobin more than 8 (okay to transfuse 1 unit today, slowest rate possible). After transfusion, he will need additional dose of IV Lasix 40 mg. # HLD: Continue atorvastatin 80 mg nightly, goal LDL less than 55.   I have spent a total of 30 minutes with patient reviewing chart , telemetry, EKGs, labs and examining patient as well as establishing an assessment and plan that was discussed with the patient.  > 50% of time was spent in direct patient care.     Signed, Marjo Bicker, MD  07/20/2022, 10:32 AM

## 2022-07-21 DIAGNOSIS — I350 Nonrheumatic aortic (valve) stenosis: Secondary | ICD-10-CM | POA: Diagnosis not present

## 2022-07-21 DIAGNOSIS — D649 Anemia, unspecified: Secondary | ICD-10-CM | POA: Diagnosis not present

## 2022-07-21 DIAGNOSIS — I5033 Acute on chronic diastolic (congestive) heart failure: Secondary | ICD-10-CM | POA: Diagnosis not present

## 2022-07-21 DIAGNOSIS — I2510 Atherosclerotic heart disease of native coronary artery without angina pectoris: Secondary | ICD-10-CM | POA: Diagnosis not present

## 2022-07-21 LAB — BASIC METABOLIC PANEL
Anion gap: 9 (ref 5–15)
BUN: 22 mg/dL (ref 8–23)
CO2: 31 mmol/L (ref 22–32)
Calcium: 8.5 mg/dL — ABNORMAL LOW (ref 8.9–10.3)
Chloride: 100 mmol/L (ref 98–111)
Creatinine, Ser: 1.61 mg/dL — ABNORMAL HIGH (ref 0.61–1.24)
GFR, Estimated: 41 mL/min — ABNORMAL LOW (ref >60)
Glucose, Bld: 114 mg/dL — ABNORMAL HIGH (ref 70–99)
Potassium: 3.7 mmol/L (ref 3.5–5.1)
Sodium: 140 mmol/L (ref 135–145)

## 2022-07-21 LAB — CBC
HCT: 31.7 % — ABNORMAL LOW (ref 39.0–52.0)
Hemoglobin: 9.4 g/dL — ABNORMAL LOW (ref 13.0–17.0)
MCH: 22.2 pg — ABNORMAL LOW (ref 26.0–34.0)
MCHC: 29.7 g/dL — ABNORMAL LOW (ref 30.0–36.0)
MCV: 74.8 fL — ABNORMAL LOW (ref 80.0–100.0)
Platelets: 99 10*3/uL — ABNORMAL LOW (ref 150–400)
RBC: 4.24 MIL/uL (ref 4.22–5.81)
RDW: 21.1 % — ABNORMAL HIGH (ref 11.5–15.5)
WBC: 7.1 10*3/uL (ref 4.0–10.5)
nRBC: 0 % (ref 0.0–0.2)

## 2022-07-21 LAB — TYPE AND SCREEN
Antibody Screen: NEGATIVE
Unit division: 0

## 2022-07-21 LAB — BPAM RBC
Blood Product Expiration Date: 202405312359
Blood Product Expiration Date: 202405312359
ISSUE DATE / TIME: 202404240944
ISSUE DATE / TIME: 202404241358
Unit Type and Rh: 5100

## 2022-07-21 LAB — GLUCOSE, CAPILLARY
Glucose-Capillary: 111 mg/dL — ABNORMAL HIGH (ref 70–99)
Glucose-Capillary: 120 mg/dL — ABNORMAL HIGH (ref 70–99)
Glucose-Capillary: 138 mg/dL — ABNORMAL HIGH (ref 70–99)
Glucose-Capillary: 123 mg/dL — ABNORMAL HIGH (ref 70–99)

## 2022-07-21 LAB — BRAIN NATRIURETIC PEPTIDE: B Natriuretic Peptide: 432 pg/mL — ABNORMAL HIGH (ref 0.0–100.0)

## 2022-07-21 MED ORDER — IPRATROPIUM BROMIDE 0.02 % IN SOLN
0.5000 mg | Freq: Four times a day (QID) | RESPIRATORY_TRACT | Status: DC
  Administered 2022-07-21 – 2022-07-23 (×11): 0.5 mg via RESPIRATORY_TRACT
  Filled 2022-07-21 (×11): qty 2.5

## 2022-07-21 MED ORDER — LEVALBUTEROL HCL 1.25 MG/0.5ML IN NEBU
1.2500 mg | INHALATION_SOLUTION | Freq: Four times a day (QID) | RESPIRATORY_TRACT | Status: DC
  Administered 2022-07-21 – 2022-07-23 (×11): 1.25 mg via RESPIRATORY_TRACT
  Filled 2022-07-21 (×11): qty 0.5

## 2022-07-21 MED ORDER — ALPRAZOLAM 0.25 MG PO TABS
0.2500 mg | ORAL_TABLET | Freq: Three times a day (TID) | ORAL | Status: DC | PRN
  Administered 2022-07-21 – 2022-07-22 (×3): 0.25 mg via ORAL
  Filled 2022-07-21 (×3): qty 1

## 2022-07-21 NOTE — Progress Notes (Signed)
PROGRESS NOTE    [name]  [name] DOB: 05-Oct-1933 DOA: 07/18/2022 PCP: [name], MD   Brief Narrative:    Mike Wright is a 89 y.o. male with medical history significant of PAD, CAD, atrial fibrillation on Eliquis, diabetes mellitus who presents to the emergency department from cardiologist office.  Patient complained of 1 week onset of leg swelling which has extended to distention of the abdomen.  He was admitted for acute on chronic diastolic CHF exacerbation and started on IV Lasix for diuresis with cardiology consultation pending.  Subjective: [name] was seen and examined this morning, stable no acute distress.  Patient had uneventful night was restless.  Patient was restless anxious earlier this morning, some shortness of breath  Otherwise awake alert oriented denies of any chest pain  Assessment & Plan:   Principal Problem:   Acute on chronic diastolic CHF (congestive heart failure) Active Problems:   Essential hypertension   Type 2 diabetes mellitus   Microcytic anemia   Coronary artery disease involving native coronary artery of native heart without angina pectoris   PAD (peripheral artery disease)   Atrial fibrillation, chronic   Mixed hyperlipidemia  Assessment and Plan:   Acute on chronic diastolic CHF with acute on chronic hypoxemia -O2 demand has increased to 4 now on 3 L of oxygen, satting 90% -At home requires 2 L   -Continue total input/output, daily weights and fluid restriction -Continue IV Lasix 60 mg twice daily -Continue heart healthy diet      -EKG personally reviewed showed atrial fibrillation with rate controlled -Echocardiogram done on 11/11/2021 showed LVEF of 70 to 75%.  No RWMA.   -Repeat echo 07/19/2022 reveals severe aortic valve stenosis  -Cardiology consulted, agreed with diuresing with IV Lasix 60 mg twice a day, Will receive an extra dose post 1 unit of blood transfusion of 40 mg of Lasix  Weight change: -3.602  kg Filed Weights   07/19/22 0615 07/20/22 0407 07/21/22 0333  Weight: 88.4 kg 87.2 kg 83.6 kg    Intake/Output Summary (Last 24 hours) at 07/21/2022 1404 Last data filed at 07/21/2022 1300 Gross per 24 hour  Intake 720 ml  Output 3050 ml  Net -2330 ml    Moderate to severe aortic valve stenosis: Echocardiogram from 07/19/2022 showed aortic valve V-max of 4.03 m/s consistent with severe aortic valve stenosis  Medication optimization by cardiology, follow-up with structural heart clinic - has appointment with Dr. Clifton James on 08/01/2022.    Chronic atrial fibrillation with rate control Continue Eliquis   Microcytic anemia MCV 72.9, iron studies show deficiency, but patient is allergic to IV iron and therefore will be started on oral ferrous sulfate    Latest Ref Rng & Units 07/21/2022    4:37 AM 07/20/2022    4:27 AM 07/19/2022    4:27 AM  CBC  WBC 4.0 - 10.5 K/uL 7.1  5.1  4.6   Hemoglobin 13.0 - 17.0 g/dL 9.4  7.0  7.1   Hematocrit 39.0 - 52.0 % 31.7  24.2  24.6   Platelets 150 - 400 K/uL 99  95  98    07/20/2022- S/p  2U PRBC transfusion >>> hemoglobin 7.0 >>>> 9.4    CAD/PAD He Presented with ischemia with close in June 2019 which required intervention of lower extremity on the outside.  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 Continue Lipitor, Eliquis, Plavix, atorvastatin 80 mg, Imdur 60 mg twice daily - PCI in 2019   Mixed  hyperlipidemia High intensity statin, atorvastatin 80 mg daily   Essential hypertension Continue amlodipine, Imdur   Type 2 diabetes mellitus Continue ISS and hypoglycemia protocol  Obesity BMI 30.52   Per patient/wife adverse reaction, allergy to iron: IV or p.o. iron supplements Medical records updated    DVT prophylaxis:Eliquis Code Status: Full Family Communication: Has not at bedside-updated Disposition Plan:  Status is: Inpatient Remains inpatient appropriate because: Need for IV medications.   Consultants:   Cardiology  Procedures:  None  Antimicrobials:  None    Objective: Vitals:   07/21/22 0333 07/21/22 0816 07/21/22 0839 07/21/22 1329  BP: (!) 137/55 (!) 147/67  (!) 122/57  Pulse: 65 (!) 59 71 61  Resp: (!) 22 (!) 22 (!) 22 (!) 26  Temp: 98.5 F (36.9 C) 98 F (36.7 C)  98 F (36.7 C)  TempSrc: Oral Oral    SpO2: (!) 89% 94% 91% 90%  Weight: 83.6 kg     Height:        Intake/Output Summary (Last 24 hours) at 07/21/2022 1404 Last data filed at 07/21/2022 1300 Gross per 24 hour  Intake 720 ml  Output 3050 ml  Net -2330 ml   Filed Weights   07/19/22 0615 07/20/22 0407 07/21/22 0333  Weight: 88.4 kg 87.2 kg 83.6 kg        General:  AAO x 3,  cooperative, no distress;   HEENT:  Normocephalic, PERRL, otherwise with in Normal limits   Neuro:  CNII-XII intact. , normal motor and sensation, reflexes intact   Lungs:   Clear to auscultation BL, Respirations unlabored,  No wheezes / crackles  Cardio:    S1/S2, RRR, No murmure, No Rubs or Gallops   Abdomen:  Soft, non-tender, bowel sounds active all four quadrants, no guarding or peritoneal signs.  Muscular  skeletal:  Limited exam -global generalized weaknesses - in bed, able to move all 4 extremities,   2+ pulses,  symmetric, No pitting edema  Skin:  Dry, warm to touch, negative for any Rashes,  Wounds: Please see nursing documentation           Data Reviewed: I have personally reviewed following labs and imaging studies  CBC: Recent Labs  Lab 07/18/22 1554 07/19/22 0427 07/20/22 0427 07/21/22 0437  WBC 5.4 4.6 5.1 7.1  HGB 7.6* 7.1* 7.0* 9.4*  HCT 26.6* 24.6* 24.2* 31.7*  MCV 72.9* 72.4* 71.4* 74.8*  PLT 106* 98* 95* 99*   Basic Metabolic Panel: Recent Labs  Lab 07/18/22 1554 07/19/22 0427 07/20/22 0427 07/21/22 0437  NA 138 139 138 140  K 4.1 3.4* 3.8 3.7  CL 104 103 102 100  CO2 GLUCOSE 108* 103* 103* 114*  BUN CREATININE 1.50* 1.41* 1.45* 1.61*  CALCIUM 8.3*  8.3* 8.2* 8.5*  MG 2.2 2.1 2.1  --   PHOS  --  4.4  --   --    GFR: Estimated Creatinine Clearance: 34.1 mL/min (A) (by C-G formula based on SCr of 1.61 mg/dL (H)). Liver Function Tests: Recent Labs  Lab 07/19/22 0427  AST 14*  ALT 10  ALKPHOS 72  BILITOT 1.3*  PROT 6.0*  ALBUMIN 3.6    Recent Labs    07/19/22 0427  HGBA1C 6.3*   CBG: Recent Labs  Lab 07/20/22 1132 07/20/22 1622 07/20/22 2035 07/21/22 0754 07/21/22 1203  GLUCAP 148* 123* 148* 111* 120*    Recent Labs    07/19/22 0427  FERRITIN 9*  TIBC 350  IRON 11*   Sepsis Labs: No results for input(s): "PROCALCITON", "LATICACIDVEN" in the last 168 hours.  No results found for this or any previous visit (from the past 240 hour(s)).       Radiology Studies: No results found.      Scheduled Meds:  amLODipine  2.5 mg Oral Daily   apixaban  2.5 mg Oral BID   atorvastatin  80 mg Oral QPM   clopidogrel  75 mg Oral Q breakfast   furosemide  60 mg Intravenous Q12H   insulin aspart  0-15 Units Subcutaneous TID WC   insulin aspart  0-5 Units Subcutaneous QHS   ipratropium  0.5 mg Nebulization Q6H   isosorbide mononitrate  60 mg Oral BID   levalbuterol  1.25 mg Nebulization Q6H   pantoprazole  40 mg Oral Daily     LOS: 3 days    Time spent: 35 minutes    Kendell Bane, MD [facility]  If 7PM-7AM, please contact night-coverage [email or web address] 07/21/2022, 2:04 PM

## 2022-07-21 NOTE — Progress Notes (Signed)
Progress Note  Patient Name: Mike Wright Date of Encounter: 07/21/2022  Primary Cardiologist: Dina Rich, MD  Subjective   No acute events overnight.  His symptoms are improving.  Inpatient Medications    Scheduled Meds:  amLODipine  2.5 mg Oral Daily   apixaban  2.5 mg Oral BID   atorvastatin  80 mg Oral QPM   clopidogrel  75 mg Oral Q breakfast   furosemide  60 mg Intravenous Q12H   insulin aspart  0-15 Units Subcutaneous TID WC   insulin aspart  0-5 Units Subcutaneous QHS   ipratropium  0.5 mg Nebulization Q6H   isosorbide mononitrate  60 mg Oral BID   levalbuterol  1.25 mg Nebulization Q6H   pantoprazole  40 mg Oral Daily   Continuous Infusions:  PRN Meds: acetaminophen **OR** acetaminophen, ALPRAZolam, ondansetron **OR** ondansetron (ZOFRAN) IV   Vital Signs    Vitals:   07/20/22 2031 07/21/22 0333 07/21/22 0816 07/21/22 0839  BP: 139/72 (!) 137/55 (!) 147/67   Pulse: (!) 59 65 (!) 59 71  Resp: 20 (!) 22 (!) 22 (!) 22  Temp: 98.2 F (36.8 C) 98.5 F (36.9 C) 98 F (36.7 C)   TempSrc: Oral Oral Oral   SpO2: 97% (!) 89% 94% 91%  Weight:  83.6 kg    Height:        Intake/Output Summary (Last 24 hours) at 07/21/2022 1004 Last data filed at 07/21/2022 0951 Gross per 24 hour  Intake 1156 ml  Output 4200 ml  Net -3044 ml   Filed Weights   07/19/22 0615 07/20/22 0407 07/21/22 0333  Weight: 88.4 kg 87.2 kg 83.6 kg    Telemetry     Personally reviewed, atrial flutter with slow ventricle response, 50-60s  ECG    None performed today  Physical Exam   GEN: No acute distress.   Neck: JVD is elevated till the angle of mandible Cardiac: Irregular rate and rhythm, ejection systolic murmur, absent S2 Respiratory: Nonlabored wheezing is resolved GI: Soft, nontender, bowel sounds present. MS: No edema; No deformity. Neuro:  Nonfocal. Psych: Alert and oriented x 3. Normal affect.  Labs    Chemistry Recent Labs  Lab 07/19/22 0427  07/20/22 0427 07/21/22 0437  NA 139 138 140  K 3.4* 3.8 3.7  CL 103 102 100  CO2 27 27 31   GLUCOSE 103* 103* 114*  BUN 20 20 22   CREATININE 1.41* 1.45* 1.61*  CALCIUM 8.3* 8.2* 8.5*  PROT 6.0*  --   --   ALBUMIN 3.6  --   --   AST 14*  --   --   ALT 10  --   --   ALKPHOS 72  --   --   BILITOT 1.3*  --   --   GFRNONAA 49* 47* 41*  ANIONGAP 9 9 9      Hematology Recent Labs  Lab 07/19/22 0427 07/20/22 0427 07/21/22 0437  WBC 4.6 5.1 7.1  RBC 3.40* 3.39* 4.24  HGB 7.1* 7.0* 9.4*  HCT 24.6* 24.2* 31.7*  MCV 72.4* 71.4* 74.8*  MCH 20.9* 20.6* 22.2*  MCHC 28.9* 28.9* 29.7*  RDW 19.6* 19.5* 21.1*  PLT 98* 95* 99*    Cardiac EnzymesNo results for input(s): "TROPONINIHS" in the last 720 hours.  BNP Recent Labs  Lab 07/18/22 1554 07/21/22 0437  BNP 332.0* 432.0*     DDimerNo results for input(s): "DDIMER" in the last 168 hours.   Radiology    ECHOCARDIOGRAM COMPLETE  Result  Date: 07/19/2022    ECHOCARDIOGRAM REPORT   Patient Name:   Mike Wright Date of Exam: 07/19/2022 Medical Rec #:  [id number]       Height:       67.0 in Accession #:    [id number]      Weight:       194.9 lb Date of Birth:  17-Mar-1934       BSA:          2.000 m Patient Age:    88 years        BP:           136/56 mmHg Patient Gender: M               HR:           69 bpm. [facility] Procedure: 2D Echo, Cardiac Doppler and Color Doppler Indications:    CHF-Acute Diastolic I50.31  History:        Patient has prior history of Echocardiogram examinations, most                 recent 11/11/2021. CHF, CAD, Arrythmias:Atrial Fibrillation,                 Bradycardia and Atrial Flutter; Risk Factors:Hypertension,                 Diabetes and Dyslipidemia. Sleep apnea,                 PAD (peripheral artery disease).  Sonographer:    Celesta Gentile RCS Referring Phys: [id number] OLADAPO ADEFESO IMPRESSIONS  1. Left ventricular ejection fraction, by estimation, is 70 to 75%. The left ventricle has  hyperdynamic function. The left ventricle has no regional wall motion abnormalities. There is mild left ventricular hypertrophy. Left ventricular diastolic function could not be evaluated due to atrial flutter.  2. Right ventricular systolic function is normal. The right ventricular size is normal. There is severely elevated pulmonary artery systolic pressure. The estimated right ventricular systolic pressure is 73.1 mmHg.  3. Left atrial size was severely dilated.  4. Right atrial size was severely dilated.  5. The mitral valve is abnormal. No evidence of mitral valve regurgitation. No evidence of mitral stenosis. Severe mitral annular calcification.  6. The tricuspid valve is abnormal. Tricuspid valve regurgitation is mild to moderate.  7. The aortic valve is calcified. Aortic valve regurgitation is mild. Moderate to severe aortic valve stenosis. Aortic valve area, by VTI measures 1.13 cm. Aortic valve mean gradient measures 33.0 mmHg. Aortic valve Vmax measures 4.03 m/s. AV DVI 0.33.  8. The inferior vena cava is dilated in size with <50% respiratory variability, suggesting right atrial pressure of 15 mmHg.  9. Increased flow velocities may be secondary to anemia, thyrotoxicosis, hyperdynamic or high flow state. Comparison(s): Changes from prior study are noted. AS worsened from moderate (DVI 0.33, Vmax 3.46 m/sec, mean PG 25 mm Hg) in 10/2021 to moderate-severe (DVI 0.33, Vmax 4.03 m/sec, mean PG 33 mm Hg) now. FINDINGS  Left Ventricle: Left ventricular ejection fraction, by estimation, is 70 to 75%. The left ventricle has hyperdynamic function. The left ventricle has no regional wall motion abnormalities. The left ventricular internal cavity size was normal in size. There is mild left ventricular hypertrophy. Left ventricular diastolic function could not be evaluated due to atrial fibrillation. Left ventricular diastolic function could not be evaluated. Right Ventricle: The right ventricular size is normal. No  increase in right ventricular wall  thickness. Right ventricular systolic function is normal. There is severely elevated pulmonary artery systolic pressure. The tricuspid regurgitant velocity is 3.81 m/s, and with an assumed right atrial pressure of 15 mmHg, the estimated right ventricular systolic pressure is 73.1 mmHg. Left Atrium: Left atrial size was severely dilated. Right Atrium: Right atrial size was severely dilated. Pericardium: There is no evidence of pericardial effusion. Mitral Valve: The mitral valve is abnormal. Severe mitral annular calcification. No evidence of mitral valve regurgitation. No evidence of mitral valve stenosis. Tricuspid Valve: The tricuspid valve is abnormal. Tricuspid valve regurgitation is mild to moderate. No evidence of tricuspid stenosis. Aortic Valve: The aortic valve is calcified. Aortic valve regurgitation is mild. Aortic regurgitation PHT measures 469 msec. Moderate to severe aortic stenosis is present. Aortic valve mean gradient measures 33.0 mmHg. Aortic valve peak gradient measures  64.8 mmHg. Aortic valve area, by VTI measures 1.13 cm. Pulmonic Valve: The pulmonic valve was normal in structure. Pulmonic valve regurgitation is trivial. No evidence of pulmonic stenosis. Aorta: The aortic root is normal in size and structure. Venous: The inferior vena cava is dilated in size with less than 50% respiratory variability, suggesting right atrial pressure of 15 mmHg. IAS/Shunts: No atrial level shunt detected by color flow Doppler.  LEFT VENTRICLE PLAX 2D LVIDd:         5.10 cm   Diastology LVIDs:         2.50 cm   LV e' medial:    6.74 cm/s LV PW:         1.30 cm   LV E/e' medial:  21.8 LV IVS:        1.10 cm   LV e' lateral:   8.38 cm/s LVOT diam:     2.00 cm   LV E/e' lateral: 17.5 LV SV:         101 LV SV Index:   51 LVOT Area:     3.14 cm  RIGHT VENTRICLE RV S prime:     11.90 cm/s TAPSE (M-mode): 2.0 cm LEFT ATRIUM              Index        RIGHT ATRIUM           Index LA  diam:        4.90 cm  2.45 cm/m   RA Area:     32.20 cm LA Vol (A2C):   112.0 ml 56.00 ml/m  RA Volume:   120.00 ml 60.00 ml/m LA Vol (A4C):   115.0 ml 57.50 ml/m LA Biplane Vol: 121.0 ml 60.50 ml/m  AORTIC VALVE AV Area (Vmax):    1.05 cm AV Area (Vmean):   1.05 cm AV Area (VTI):     1.13 cm AV Vmax:           402.50 cm/s AV Vmean:          260.000 cm/s AV VTI:            0.900 m AV Peak Grad:      64.8 mmHg AV Mean Grad:      33.0 mmHg LVOT Vmax:         134.00 cm/s LVOT Vmean:        86.700 cm/s LVOT VTI:          0.323 m LVOT/AV VTI ratio: 0.36 AI PHT:            469 msec  AORTA Ao Root diam: 3.20 cm MITRAL VALVE  TRICUSPID VALVE MV Area (PHT): 4.06 cm     TR Peak grad:   58.1 mmHg MV Decel Time: 187 msec     TR Vmax:        381.00 cm/s MV E velocity: 147.00 cm/s MV A velocity: 67.00 cm/s   SHUNTS MV E/A ratio:  2.19         Systemic VTI:  0.32 m                             Systemic Diam: 2.00 cm Rohit Deloria Priya Guy Seese Electronically signed by Winfield Rast Damarian Priola Signature Date/Time: 07/19/2022/2:48:15 PM    Final     Cardiac Studies      Assessment & Plan   Patient is 89 year old M known to have CAD s/p LAD PCI in 2019, atrial fibrillation, history of GI bleed, PAD manifested by CLI and ischemic great toes s/p amputation, chronic diastolic heart failure, chronic anemia received multiple blood transfusions, moderate aortic valve stenosis, HLD is currently admitted to hospitalist team for the management of acute on chronic HFpEF.   # Acute on chronic HFpEF: Patient had adequate urine output of 3.5L with net -2.3 L in the last 24 hours with the current IV Lasix.  Will continue IV Lasix 60 mg twice daily. Anticipate 1 more day of diuresis and switch to p.o. Lasix. # Moderate to severe aortic valve stenosis: Echocardiogram from 07/19/2022 showed aortic valve V-max of 4.03 m/s consistent with severe aortic valve stenosis but the mean pressure gradient 33 mmHg, aortic valve area  1.13 cm and aortic valve DVI 0.33 are consistent with moderate aortic valve stenosis.  Physical examination showed absent S2, consistent with severe aortic valve stenosis. Ambulatory referral to structural heart clinic was placed. Patient has appointment with Dr. Clifton James on 08/01/2022. # CAD s/p LAD PCI in 2019, PAD: Continue Eliquis 2.5 mg twice daily and Plavix 75 mg once daily, continue high intensity statin atorvastatin 80 mg nightly. Continue antianginal therapy with Imdur 60 mg twice daily.  # History of atrial fibrillation, atypical atrial flutter on EKG: EKG on admission showed atrial flutter with slow ventricular response. Telemetry showed atrial flutter with slow ventricle response, HR ranging between 50 and 60s. Patient will benefit from 2-week event monitor upon discharge to rule out intermittent high-grade AVB in the setting of atrial flutter. Avoid AV nodal agents. # Chronic anemia: Patient received 2 units of PRBC on 07/20/2022 and also had IV Lasix at the end of the transfusion. # HLD: Continue atorvastatin 80 mg nightly, goal LDL less than 55.   I have spent a total of 30 minutes with patient reviewing chart , telemetry, EKGs, labs and examining patient as well as establishing an assessment and plan that was discussed with the patient.  > 50% of time was spent in direct patient care.     Signed, Marjo Bicker, MD  07/21/2022, 10:04 AM

## 2022-07-22 ENCOUNTER — Inpatient Hospital Stay (HOSPITAL_COMMUNITY): Payer: Medicare HMO

## 2022-07-22 DIAGNOSIS — I5033 Acute on chronic diastolic (congestive) heart failure: Secondary | ICD-10-CM | POA: Diagnosis not present

## 2022-07-22 DIAGNOSIS — D649 Anemia, unspecified: Secondary | ICD-10-CM | POA: Diagnosis not present

## 2022-07-22 DIAGNOSIS — I2510 Atherosclerotic heart disease of native coronary artery without angina pectoris: Secondary | ICD-10-CM | POA: Diagnosis not present

## 2022-07-22 DIAGNOSIS — I350 Nonrheumatic aortic (valve) stenosis: Secondary | ICD-10-CM | POA: Diagnosis not present

## 2022-07-22 LAB — CBC
HCT: 31.9 % — ABNORMAL LOW (ref 39.0–52.0)
Hemoglobin: 9.4 g/dL — ABNORMAL LOW (ref 13.0–17.0)
MCH: 22 pg — ABNORMAL LOW (ref 26.0–34.0)
MCHC: 29.5 g/dL — ABNORMAL LOW (ref 30.0–36.0)
MCV: 74.7 fL — ABNORMAL LOW (ref 80.0–100.0)
Platelets: 102 10*3/uL — ABNORMAL LOW (ref 150–400)
RBC: 4.27 MIL/uL (ref 4.22–5.81)
RDW: 21.4 % — ABNORMAL HIGH (ref 11.5–15.5)
WBC: 7.3 10*3/uL (ref 4.0–10.5)
nRBC: 0 % (ref 0.0–0.2)

## 2022-07-22 LAB — BASIC METABOLIC PANEL
Anion gap: 12 (ref 5–15)
BUN: 23 mg/dL (ref 8–23)
CO2: 30 mmol/L (ref 22–32)
Calcium: 8.4 mg/dL — ABNORMAL LOW (ref 8.9–10.3)
Chloride: 97 mmol/L — ABNORMAL LOW (ref 98–111)
Creatinine, Ser: 1.37 mg/dL — ABNORMAL HIGH (ref 0.61–1.24)
GFR, Estimated: 50 mL/min — ABNORMAL LOW (ref >60)
Glucose, Bld: 103 mg/dL — ABNORMAL HIGH (ref 70–99)
Potassium: 3 mmol/L — ABNORMAL LOW (ref 3.5–5.1)
Sodium: 139 mmol/L (ref 135–145)

## 2022-07-22 LAB — GLUCOSE, CAPILLARY
Glucose-Capillary: 125 mg/dL — ABNORMAL HIGH (ref 70–99)
Glucose-Capillary: 107 mg/dL — ABNORMAL HIGH (ref 70–99)
Glucose-Capillary: 142 mg/dL — ABNORMAL HIGH (ref 70–99)
Glucose-Capillary: 145 mg/dL — ABNORMAL HIGH (ref 70–99)

## 2022-07-22 LAB — BRAIN NATRIURETIC PEPTIDE: B Natriuretic Peptide: 377 pg/mL — ABNORMAL HIGH (ref 0.0–100.0)

## 2022-07-22 MED ORDER — POTASSIUM CHLORIDE CRYS ER 20 MEQ PO TBCR
40.0000 meq | EXTENDED_RELEASE_TABLET | Freq: Once | ORAL | Status: AC
  Administered 2022-07-22: 40 meq via ORAL
  Filled 2022-07-22: qty 2

## 2022-07-22 NOTE — Care Management Important Message (Signed)
Important Message  Patient Details  Name: Mike Wright MRN: [id number] Date of Birth: [birth year]   Medicare Important Message Given:  Yes     Corey Harold 07/22/2022, 11:32 AM

## 2022-07-22 NOTE — Progress Notes (Signed)
Progress Note  Patient Name: Mike Wright Date of Encounter: 07/22/2022  Primary Cardiologist: Dina Rich, MD  Subjective   No acute events overnight.  His symptoms are improving.  Inpatient Medications    Scheduled Meds:  amLODipine  2.5 mg Oral Daily   apixaban  2.5 mg Oral BID   atorvastatin  80 mg Oral QPM   clopidogrel  75 mg Oral Q breakfast   furosemide  60 mg Intravenous Q12H   insulin aspart  0-15 Units Subcutaneous TID WC   insulin aspart  0-5 Units Subcutaneous QHS   ipratropium  0.5 mg Nebulization Q6H   isosorbide mononitrate  60 mg Oral BID   levalbuterol  1.25 mg Nebulization Q6H   pantoprazole  40 mg Oral Daily   potassium chloride  40 mEq Oral Once   Continuous Infusions:  PRN Meds: acetaminophen **OR** acetaminophen, ALPRAZolam, ondansetron **OR** ondansetron (ZOFRAN) IV   Vital Signs    Vitals:   07/22/22 0500 07/22/22 0838 07/22/22 0917 07/22/22 0917  BP:   128/60 128/60  Pulse:   72 72  Resp:   (!) 22   Temp:   99.2 F (37.3 C)   TempSrc:   Oral   SpO2:  94% 95% 94%  Weight: 79.8 kg     Height:        Intake/Output Summary (Last 24 hours) at 07/22/2022 1045 Last data filed at 07/22/2022 0700 Gross per 24 hour  Intake 510 ml  Output 3500 ml  Net -2990 ml   Filed Weights   07/20/22 0407 07/21/22 0333 07/22/22 0500  Weight: 87.2 kg 83.6 kg 79.8 kg    Telemetry     Personally reviewed, atrial flutter with slow ventricle response, 50-60s  ECG    None performed today  Physical Exam   GEN: No acute distress.   Neck: JVD is elevated till the angle of mandible Cardiac: Irregular rate and rhythm, ejection systolic murmur, absent S2 Respiratory: Nonlabored wheezing is resolved GI: Soft, nontender, bowel sounds present. MS: No edema; No deformity. Neuro:  Nonfocal. Psych: Alert and oriented x 3. Normal affect.  Labs    Chemistry Recent Labs  Lab 07/19/22 0427 07/20/22 0427 07/21/22 0437 07/22/22 0435  NA 139  138 140 139  K 3.4* 3.8 3.7 3.0*  CL 103 102 100 97*  CO2 27 27 31 30   GLUCOSE 103* 103* 114* 103*  BUN 20 20 22 23   CREATININE 1.41* 1.45* 1.61* 1.37*  CALCIUM 8.3* 8.2* 8.5* 8.4*  PROT 6.0*  --   --   --   ALBUMIN 3.6  --   --   --   AST 14*  --   --   --   ALT 10  --   --   --   ALKPHOS 72  --   --   --   BILITOT 1.3*  --   --   --   GFRNONAA 49* 47* 41* 50*  ANIONGAP 9 9 9 12      Hematology Recent Labs  Lab 07/20/22 0427 07/21/22 0437 07/22/22 0435  WBC 5.1 7.1 7.3  RBC 3.39* 4.24 4.27  HGB 7.0* 9.4* 9.4*  HCT 24.2* 31.7* 31.9*  MCV 71.4* 74.8* 74.7*  MCH 20.6* 22.2* 22.0*  MCHC 28.9* 29.7* 29.5*  RDW 19.5* 21.1* 21.4*  PLT 95* 99* 102*    Cardiac EnzymesNo results for input(s): "TROPONINIHS" in the last 720 hours.  BNP Recent Labs  Lab 07/18/22 1554 07/21/22 0437 07/22/22 0435  BNP 332.0* 432.0* 377.0*     DDimerNo results for input(s): "DDIMER" in the last 168 hours.   Radiology    DG CHEST PORT 1 VIEW  Result Date: 07/22/2022 CLINICAL DATA:  Shortness of breath EXAM: PORTABLE CHEST 1 VIEW COMPARISON:  CXR 07/18/22 FINDINGS: Low lung volumes. No pleural effusion. No pneumothorax. Cardiomegaly. There are prominent bilateral interstitial opacities, which could represent pulmonary venous congestion or mild pulmonary edema. There appear to be a more focal opacities in the right upper and lower lobes, which could represent infection. No radiographically apparent displaced rib fractures. Visualized upper abdomen is unremarkable. IMPRESSION: 1. Cardiomegaly with prominent bilateral interstitial opacities, which could represent pulmonary venous congestion or mild pulmonary edema. 2. There appear to be more focal opacities in the right upper and lower lobes, which could represent infection. Electronically Signed   By: Lorenza Cambridge M.D.   On: 07/22/2022 08:23    Cardiac Studies      Assessment & Plan   Patient is 89 year old M known to have CAD s/p LAD PCI in  2019, atrial fibrillation, history of GI bleed, PAD manifested by CLI and ischemic great toes s/p amputation, chronic diastolic heart failure, chronic anemia received multiple blood transfusions, moderate aortic valve stenosis, HLD is currently admitted to hospitalist team for the management of acute on chronic HFpEF.   # Acute on chronic HFpEF: Patient had adequate urine output of 3.6L with net -2.9 L in the last 24 hours with the current IV Lasix.  Will continue IV Lasix 60 mg twice daily. He still has JVD elevated to just below the angle of mandible. Urine is pale-colored, indicating volume overload. Anticipate few more days of IV diuresis and switch to p.o. Lasix. # Moderate to severe aortic valve stenosis: Echocardiogram from 07/19/2022 showed aortic valve V-max of 4.03 m/s consistent with severe aortic valve stenosis but the mean pressure gradient 33 mmHg, aortic valve area 1.13 cm and aortic valve DVI 0.33 are consistent with moderate aortic valve stenosis. Physical examination showed absent S2, consistent with severe aortic valve stenosis. Ambulatory referral to structural heart clinic was placed. Patient has appointment with Dr. Clifton James on 08/01/2022. # CAD s/p LAD PCI in 2019, PAD: Continue Eliquis 2.5 mg twice daily and Plavix 75 mg once daily, continue high intensity statin atorvastatin 80 mg nightly. Continue antianginal therapy with Imdur 60 mg twice daily.  # History of atrial fibrillation, atypical atrial flutter on EKG: EKG on admission showed atrial flutter with slow ventricular response. Telemetry showed atrial flutter with slow ventricle response, HR ranging between 50 and 60s. Patient will benefit from 2-week event monitor upon discharge to rule out intermittent high-grade AVB in the setting of atrial flutter. Avoid AV nodal agents. # Chronic anemia: Patient received 2 units of PRBC on 07/20/2022 and also had IV Lasix at the end of the transfusion. # HLD: Continue atorvastatin 80 mg  nightly, goal LDL less than 55.   I have spent a total of 30 minutes with patient reviewing chart , telemetry, EKGs, labs and examining patient as well as establishing an assessment and plan that was discussed with the patient.  > 50% of time was spent in direct patient care.     Signed, Marjo Bicker, MD  07/22/2022, 10:45 AM

## 2022-07-22 NOTE — Progress Notes (Signed)
   07/22/22 1000  ReDS Vest / Clip  Station Marker D  Ruler Value 36  ReDS Value Range 36 - 40  ReDS Actual Value 36

## 2022-07-22 NOTE — Progress Notes (Signed)
Patient presents with times of confusion and pulls oxygen and clothes off. Oxygen sat dropped to 88% while off of oxygen, oxygen reapplied at 5L patient saturation at 93%.

## 2022-07-22 NOTE — Progress Notes (Signed)
PROGRESS NOTE    Mike JUNIOUS  Wright:[id number] DOB: June 07, 1933 DOA: 07/18/2022 PCP: [name], MD   Brief Narrative:   Mike Wright is a 89 y.o. male with medical history significant of PAD, CAD, atrial fibrillation on Eliquis, diabetes mellitus who presents to the emergency department from cardiologist office.  Patient complained of 1 week onset of leg swelling which has extended to distention of the abdomen.  He was admitted for acute on chronic diastolic CHF exacerbation and started on IV Lasix for diuresis with cardiology consultation pending.  Subjective: [name] was seen and examined this morning, O2 demand has increased to 5 L of oxygen.  Shortness of breath with exertion denies any chest pain Awake and oriented, no issues overnight  Assessment & Plan:   Principal Problem:   Acute on chronic diastolic CHF (congestive heart failure) (HCC) Active Problems:   Essential hypertension   Type 2 diabetes mellitus (HCC)   Microcytic anemia   Coronary artery disease involving native coronary artery of native heart without angina pectoris   PAD (peripheral artery disease) (HCC)   Atrial fibrillation, chronic (HCC)   Mixed hyperlipidemia  Assessment and Plan:   Acute on chronic diastolic CHF with acute on chronic hypoxemia -To demand has increased to 5 L of oxygen, satting 94% -At home requires 2 L   -Continue total input/output, daily weights and fluid restriction -Continue IV Lasix 60 mg twice daily -Continue heart healthy diet      -EKG personally reviewed showed atrial fibrillation with rate controlled -Echocardiogram done on 11/11/2021 showed LVEF of 70 to 75%.  No RWMA.   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, continue to be volume overloaded Need IV diuretics--continue Lasix at 60 mg IV twice daily  -ReDS Clip right at 36 (value range 36-40) - BNP 432 >> 377.0 Will receive an extra dose post 1 unit  of blood transfusion of 40 mg of Lasix  Weight change: -3.798 kg Filed Weights   07/20/22 0407 07/21/22 0333 07/22/22 0500  Weight: 87.2 kg 83.6 kg 79.8 kg    Intake/Output Summary (Last 24 hours) at 07/22/2022 1111 Last data filed at 07/22/2022 0700 Gross per 24 hour  Intake 510 ml  Output 3500 ml  Net -2990 ml    Moderate to severe aortic valve stenosis: Echocardiogram from 07/19/2022 showed aortic valve V-max of 4.03 m/s consistent with severe aortic valve stenosis  Medication optimization by cardiology, follow-up with structural heart clinic - has appointment with Dr. Clifton James on 08/01/2022.    Chronic atrial fibrillation with rate control Continue Eliquis   Microcytic anemia MCV 72.9, iron studies show deficiency, but patient is allergic to IV iron and therefore will be started on oral ferrous sulfate    Latest Ref Rng & Units 07/22/2022    4:35 AM 07/21/2022    4:37 AM 07/20/2022    4:27 AM  CBC  WBC 4.0 - 10.5 K/uL 7.3  7.1  5.1   Hemoglobin 13.0 - 17.0 g/dL 9.4  9.4  7.0   Hematocrit 39.0 - 52.0 % 31.9  31.7  24.2   Platelets 150 - 400 K/uL 102  99  95    07/20/2022- S/p  2U PRBC transfusion >>> hemoglobin 7.0 >>>> 9.4    CAD/PAD He Presented with ischemia with close in June 2019 which required intervention of lower extremity on the outside.  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 Continue Lipitor, Eliquis, Plavix, atorvastatin  80 mg, Imdur 60 mg twice daily - PCI in 2019   Hypokalemia  -Continue to monitor and repleting orally checking magnesium level   mixed hyperlipidemia High intensity statin, atorvastatin 80 mg daily   Essential hypertension Continue amlodipine, Imdur   Type 2 diabetes mellitus Continue ISS and hypoglycemia protocol  Obesity BMI 30.52   Per patient/wife adverse reaction, allergy to iron: IV or p.o. iron supplements Medical records updated    DVT prophylaxis:Eliquis Code Status: Full Family Communication: Has not  at bedside-updated Disposition Plan:  Status is: Inpatient Remains inpatient appropriate because: Need for IV medications.   Consultants:  Cardiology  Procedures:  None  Antimicrobials:  None    Objective: Vitals:   07/22/22 0500 07/22/22 0838 07/22/22 0917 07/22/22 0917  BP:   128/60 128/60  Pulse:   72 72  Resp:   (!) 22   Temp:   99.2 F (37.3 C)   TempSrc:   Oral   SpO2:  94% 95% 94%  Weight: 79.8 kg     Height:        Intake/Output Summary (Last 24 hours) at 07/22/2022 1111 Last data filed at 07/22/2022 0700 Gross per 24 hour  Intake 510 ml  Output 3500 ml  Net -2990 ml   Filed Weights   07/20/22 0407 07/21/22 0333 07/22/22 0500  Weight: 87.2 kg 83.6 kg 79.8 kg         General:  AAO x 3,  cooperative, no distress;   HEENT:  Normocephalic, PERRL, otherwise with in Normal limits   Neuro:  CNII-XII intact. , normal motor and sensation, reflexes intact   Lungs:   Clear to auscultation BL, Respirations unlabored,  No wheezes / crackles  Cardio:    + JVD, S1/S2, RRR, No murmure, No Rubs or Gallops   Abdomen:  Soft, non-tender, bowel sounds active all four quadrants, no guarding or peritoneal signs.  Muscular  skeletal:  Limited exam -global generalized weaknesses - in bed, able to move all 4 extremities,   2+ pulses,  symmetric, +1  pitting edema  Skin:  Dry, warm to touch, negative for any Rashes,  Wounds: Please see nursing documentation         Data Reviewed: I have personally reviewed following labs and imaging studies  CBC: Recent Labs  Lab 07/18/22 1554 07/19/22 0427 07/20/22 0427 07/21/22 0437 07/22/22 0435  WBC 5.4 4.6 5.1 7.1 7.3  HGB 7.6* 7.1* 7.0* 9.4* 9.4*  HCT 26.6* 24.6* 24.2* 31.7* 31.9*  MCV 72.9* 72.4* 71.4* 74.8* 74.7*  PLT 106* 98* 95* 99* 102*   Basic Metabolic Panel: Recent Labs  Lab 07/18/22 1554 07/19/22 0427 07/20/22 0427 07/21/22 0437 07/22/22 0435  NA 138 139 138 140 139  K 4.1 3.4* 3.8 3.7 3.0*  CL 104  103 102 100 97*  CO2 26 27 27 31 30   GLUCOSE 108* 103* 103* 114* 103*  BUN 22 20 20 22 23   CREATININE 1.50* 1.41* 1.45* 1.61* 1.37*  CALCIUM 8.3* 8.3* 8.2* 8.5* 8.4*  MG 2.2 2.1 2.1  --   --   PHOS  --  4.4  --   --   --    GFR: Estimated Creatinine Clearance: 39.2 mL/min (A) (by C-G formula based on SCr of 1.37 mg/dL (H)). Liver Function Tests: Recent Labs  Lab 07/19/22 0427  AST 14*  ALT 10  ALKPHOS 72  BILITOT 1.3*  PROT 6.0*  ALBUMIN 3.6    No results for input(s): "HGBA1C" in the  last 72 hours.  CBG: Recent Labs  Lab 07/21/22 0754 07/21/22 1203 07/21/22 1621 07/21/22 2155 07/22/22 0919  GLUCAP 111* 120* 138* 123* 125*    No results for input(s): "VITAMINB12", "FOLATE", "FERRITIN", "TIBC", "IRON", "RETICCTPCT" in the last 72 hours.  Sepsis Labs: No results for input(s): "PROCALCITON", "LATICACIDVEN" in the last 168 hours.  No results found for this or any previous visit (from the past 240 hour(s)).       Radiology Studies: DG CHEST PORT 1 VIEW  Result Date: 07/22/2022 CLINICAL DATA:  Shortness of breath EXAM: PORTABLE CHEST 1 VIEW COMPARISON:  CXR 07/18/22 FINDINGS: Low lung volumes. No pleural effusion. No pneumothorax. Cardiomegaly. There are prominent bilateral interstitial opacities, which could represent pulmonary venous congestion or mild pulmonary edema. There appear to be a more focal opacities in the right upper and lower lobes, which could represent infection. No radiographically apparent displaced rib fractures. Visualized upper abdomen is unremarkable. IMPRESSION: 1. Cardiomegaly with prominent bilateral interstitial opacities, which could represent pulmonary venous congestion or mild pulmonary edema. 2. There appear to be more focal opacities in the right upper and lower lobes, which could represent infection. Electronically Signed   By: Lorenza Cambridge M.D.   On: 07/22/2022 08:23        Scheduled Meds:  amLODipine  2.5 mg Oral Daily   apixaban   2.5 mg Oral BID   atorvastatin  80 mg Oral QPM   clopidogrel  75 mg Oral Q breakfast   furosemide  60 mg Intravenous Q12H   insulin aspart  0-15 Units Subcutaneous TID WC   insulin aspart  0-5 Units Subcutaneous QHS   ipratropium  0.5 mg Nebulization Q6H   isosorbide mononitrate  60 mg Oral BID   levalbuterol  1.25 mg Nebulization Q6H   pantoprazole  40 mg Oral Daily     LOS: 4 days    Time spent: 35 minutes    Kendell Bane, MD [facility]  If 7PM-7AM, please contact night-coverage [email or web address] 07/22/2022, 11:11 AM

## 2022-07-23 DIAGNOSIS — I5033 Acute on chronic diastolic (congestive) heart failure: Secondary | ICD-10-CM | POA: Diagnosis not present

## 2022-07-23 LAB — CBC
HCT: 31.7 % — ABNORMAL LOW (ref 39.0–52.0)
Hemoglobin: 9.3 g/dL — ABNORMAL LOW (ref 13.0–17.0)
MCH: 22.2 pg — ABNORMAL LOW (ref 26.0–34.0)
MCHC: 29.3 g/dL — ABNORMAL LOW (ref 30.0–36.0)
MCV: 75.8 fL — ABNORMAL LOW (ref 80.0–100.0)
Platelets: 102 10*3/uL — ABNORMAL LOW (ref 150–400)
RBC: 4.18 MIL/uL — ABNORMAL LOW (ref 4.22–5.81)
RDW: 21.9 % — ABNORMAL HIGH (ref 11.5–15.5)
WBC: 7.5 10*3/uL (ref 4.0–10.5)
nRBC: 0 % (ref 0.0–0.2)

## 2022-07-23 LAB — BASIC METABOLIC PANEL
Anion gap: 10 (ref 5–15)
BUN: 27 mg/dL — ABNORMAL HIGH (ref 8–23)
CO2: 30 mmol/L (ref 22–32)
Calcium: 8.4 mg/dL — ABNORMAL LOW (ref 8.9–10.3)
Chloride: 98 mmol/L (ref 98–111)
Creatinine, Ser: 1.38 mg/dL — ABNORMAL HIGH (ref 0.61–1.24)
GFR, Estimated: 50 mL/min — ABNORMAL LOW (ref >60)
Glucose, Bld: 124 mg/dL — ABNORMAL HIGH (ref 70–99)
Potassium: 3.1 mmol/L — ABNORMAL LOW (ref 3.5–5.1)
Sodium: 138 mmol/L (ref 135–145)

## 2022-07-23 LAB — GLUCOSE, CAPILLARY
Glucose-Capillary: 125 mg/dL — ABNORMAL HIGH (ref 70–99)
Glucose-Capillary: 154 mg/dL — ABNORMAL HIGH (ref 70–99)
Glucose-Capillary: 107 mg/dL — ABNORMAL HIGH (ref 70–99)
Glucose-Capillary: 149 mg/dL — ABNORMAL HIGH (ref 70–99)

## 2022-07-23 LAB — MAGNESIUM: Magnesium: 2 mg/dL (ref 1.7–2.4)

## 2022-07-23 LAB — BRAIN NATRIURETIC PEPTIDE: B Natriuretic Peptide: 200 pg/mL — ABNORMAL HIGH (ref 0.0–100.0)

## 2022-07-23 MED ORDER — POTASSIUM CHLORIDE CRYS ER 20 MEQ PO TBCR
40.0000 meq | EXTENDED_RELEASE_TABLET | Freq: Once | ORAL | Status: AC
  Administered 2022-07-23: 40 meq via ORAL
  Filled 2022-07-23: qty 2

## 2022-07-23 MED ORDER — FUROSEMIDE 40 MG PO TABS: 40.0000 mg | ORAL_TABLET | Freq: Two times a day (BID) | ORAL | 2 refills | Status: DC

## 2022-07-23 MED ORDER — LEVALBUTEROL HCL 1.25 MG/0.5ML IN NEBU
1.2500 mg | INHALATION_SOLUTION | Freq: Three times a day (TID) | RESPIRATORY_TRACT | Status: DC
  Administered 2022-07-24 – 2022-07-26 (×6): 1.25 mg via RESPIRATORY_TRACT
  Filled 2022-07-23 (×7): qty 0.5

## 2022-07-23 MED ORDER — ALPRAZOLAM 0.5 MG PO TABS
0.5000 mg | ORAL_TABLET | Freq: Three times a day (TID) | ORAL | Status: DC | PRN
  Administered 2022-07-23 – 2022-07-26 (×3): 0.5 mg via ORAL
  Filled 2022-07-23 (×4): qty 1

## 2022-07-23 MED ORDER — IPRATROPIUM BROMIDE 0.02 % IN SOLN
0.5000 mg | Freq: Three times a day (TID) | RESPIRATORY_TRACT | Status: DC
  Administered 2022-07-24 – 2022-07-26 (×6): 0.5 mg via RESPIRATORY_TRACT
  Filled 2022-07-23 (×7): qty 2.5

## 2022-07-23 MED ORDER — TRAZODONE HCL 50 MG PO TABS
50.0000 mg | ORAL_TABLET | Freq: Every day | ORAL | Status: DC
  Administered 2022-07-23: 50 mg via ORAL
  Filled 2022-07-23: qty 1

## 2022-07-23 NOTE — Discharge Summary (Signed)
Progress note   Patient: Mike Wright MRN: [id number] DOB: 27-Jan-1934  Admit date:     07/18/2022  Discharge date:   Discharge Physician: Kendell Bane   PCP: [name], MD   Recommendations at discharge:  Monitor daily weight, any increase in weight 3-5 pounds, lower extremity swelling, shortness of breath will notify your heart failure team cardiologist Cardiologist within 1 week Follow with the PCP in 1-2 weeks  Discharge Diagnoses: Principal Problem:   Acute on chronic diastolic CHF (congestive heart failure) (HCC) Active Problems:   Essential hypertension   Type 2 diabetes mellitus (HCC)   Microcytic anemia   Coronary artery disease involving native coronary artery of native heart without angina pectoris   PAD (peripheral artery disease) (HCC)   Atrial fibrillation, chronic (HCC)   Mixed hyperlipidemia  rief Narrative:   Mike Wright is a 89 y.o. male with medical history significant of PAD, CAD, atrial fibrillation on Eliquis, diabetes mellitus who presents to the emergency department from cardiologist office.  Patient complained of 1 week onset of leg swelling which has extended to distention of the abdomen.  He was admitted for acute on chronic diastolic CHF exacerbation and started on IV Lasix for diuresis with cardiology consultation pending.   Subjective: Mike Wright was seen and examined this morning, stable, had some confusion and sundowning last night hit his elbow where he has developed some swelling His O2 demand still remains at 5 L of oxygen, satting 98% this morning   Assessment & Plan:   Principal Problem:   Acute on chronic diastolic CHF (congestive heart failure) (HCC) Active Problems:   Essential hypertension   Type 2 diabetes mellitus (HCC)   Microcytic anemia   Coronary artery disease involving native coronary artery of native heart without angina pectoris   PAD (peripheral artery disease) (HCC)   Atrial fibrillation, chronic (HCC)    Mixed hyperlipidemia   Assessment and Plan:     Acute on chronic diastolic CHF with acute on chronic hypoxemia -To demand has increased to 5 L of oxygen, satting 94% -At home requires 2 L     -Continue total input/output, daily weights and fluid restriction -Continue IV Lasix 60 mg twice daily -Continue heart healthy diet      -EKG personally reviewed showed atrial fibrillation with rate controlled -Echocardiogram done on 11/11/2021 showed LVEF of 70 to 75%.  No RWMA.   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, continue to be volume overloaded Need IV diuretics--continue Lasix at 60 mg IV twice daily   -ReDS Clip right at 36 (value range 36-40) - BNP 432 >> 377.0 >> 200 Will receive an extra dose post 1 unit of blood transfusion of 40 mg of Lasix  Weight change: -3.798 kg      Filed Weights    07/20/22 0407 07/21/22 0333 07/22/22 0500  Weight: 87.2 kg 83.6 kg 79.8 kg       Intake/Output Summary (Last 24 hours) at 07/23/2022 1127 Last data filed at 07/23/2022 4098 Gross per 24 hour  Intake --  Output 2100 ml  Net -2100 ml        Moderate to severe aortic valve stenosis: Echocardiogram from 07/19/2022 showed aortic valve V-max of 4.03 m/s consistent with severe aortic valve stenosis  Medication optimization by cardiology, follow-up with structural heart clinic - has appointment with Dr. Clifton James on 08/01/2022.      Chronic atrial fibrillation with rate control  Continue Eliquis   Microcytic anemia MCV 72.9, iron studies show deficiency, but patient is allergic to IV iron and therefore will be started on oral ferrous sulfate    Latest Ref Rng & Units 07/23/2022    4:47 AM 07/22/2022    4:35 AM 07/21/2022    4:37 AM  CBC  WBC 4.0 - 10.5 K/uL 7.5  7.3  7.1   Hemoglobin 13.0 - 17.0 g/dL 9.3  9.4  9.4   Hematocrit 39.0 - 52.0 % 31.7  31.9  31.7   Platelets 150 - 400 K/uL 102  102  99      07/20/2022- S/p  2U PRBC  transfusion >>> hemoglobin 7.0 >>>> 9.4     CAD/PAD He Presented with ischemia with close in June 2019 which required intervention of lower extremity on the outside.  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 Continue Lipitor, Eliquis, Plavix, atorvastatin 80 mg, Imdur 60 mg twice daily - PCI in 2019   Hypokalemia  -Potassium still running low, repleting orally and checking magnesium     mixed hyperlipidemia High intensity statin, atorvastatin 80 mg daily   Essential hypertension Continue amlodipine, Imdur   Type 2 diabetes mellitus Continue ISS and hypoglycemia protocol   Obesity BMI 30.52   Per patient/wife adverse reaction, allergy to iron: IV or p.o. iron supplements Medical records updated       DVT prophylaxis:Eliquis Code Status: Full Family Communication: Has not at bedside-updated   Consultants: Cardiologist  Disposition: Home health Diet recommendation:  Discharge Diet Orders (From admission, onward)     Start     Ordered   07/23/22 0000  Diet - low sodium heart healthy        07/23/22 1123           Cardiac diet DISCHARGE MEDICATION:   Follow-up Information     [facility], [facility] Follow up.   Specialty: Home Health Services Why: PT will call to schedule your next visit. Contact information: [address] [place] [address] [phone]                Discharge Exam: Filed Weights   07/21/22 0333 07/22/22 0500 07/23/22 0417  Weight: 83.6 kg 79.8 kg 78.3 kg        General:  AAO x 3,  cooperative, no distress;   HEENT:  Normocephalic, PERRL, otherwise with in Normal limits   Neuro:  CNII-XII intact. , normal motor and sensation, reflexes intact   Lungs:   Clear to auscultation BL, Respirations unlabored,  No wheezes / crackles  Cardio:    S1/S2, RRR, No murmure, No Rubs or Gallops   Abdomen:  Soft, non-tender, bowel sounds active all four quadrants, no guarding or peritoneal signs.   Muscular  skeletal:  Limited exam -sever global generalized weaknesses - in bed, able to move all 4 extremities,   2+ pulses,  symmetric, No pitting edema  Skin:  Dry, warm to touch, negative for any Rashes,  Wounds: Please see nursing documentation         Condition at discharge: fair  The results of significant diagnostics from this hospitalization (including imaging, microbiology, ancillary and laboratory) are listed below for reference.   Imaging Studies: DG CHEST PORT 1 VIEW  Result Date: 07/22/2022 CLINICAL DATA:  Shortness of breath EXAM: PORTABLE CHEST 1 VIEW COMPARISON:  CXR 07/18/22 FINDINGS: Low lung volumes. No pleural effusion. No pneumothorax. Cardiomegaly. There are prominent bilateral interstitial opacities, which could represent pulmonary venous  congestion or mild pulmonary edema. There appear to be a more focal opacities in the right upper and lower lobes, which could represent infection. No radiographically apparent displaced rib fractures. Visualized upper abdomen is unremarkable. IMPRESSION: 1. Cardiomegaly with prominent bilateral interstitial opacities, which could represent pulmonary venous congestion or mild pulmonary edema. 2. There appear to be more focal opacities in the right upper and lower lobes, which could represent infection. Electronically Signed   By: Lorenza Cambridge M.D.   On: 07/22/2022 08:23   ECHOCARDIOGRAM COMPLETE  Result Date: 07/19/2022    ECHOCARDIOGRAM REPORT   Patient Name:   Mike Wright Date of Exam: 07/19/2022 Medical Rec #:  [id number]       Height:       67.0 in Accession #:    [id number]      Weight:       194.9 lb Date of Birth:  Oct 21, 1933       BSA:          2.000 m Patient Age:    88 years        BP:           136/56 mmHg Patient Gender: M               HR:           69 bpm. [facility] Procedure: 2D Echo, Cardiac Doppler and Color Doppler Indications:    CHF-Acute Diastolic I50.31  History:        Patient has prior history  of Echocardiogram examinations, most                 recent 11/11/2021. CHF, CAD, Arrythmias:Atrial Fibrillation,                 Bradycardia and Atrial Flutter; Risk Factors:Hypertension,                 Diabetes and Dyslipidemia. Sleep apnea,                 PAD (peripheral artery disease).  Sonographer:    Celesta Gentile RCS Referring Phys: [id number] OLADAPO ADEFESO IMPRESSIONS  1. Left ventricular ejection fraction, by estimation, is 70 to 75%. The left ventricle has hyperdynamic function. The left ventricle has no regional wall motion abnormalities. There is mild left ventricular hypertrophy. Left ventricular diastolic function could not be evaluated due to atrial flutter.  2. Right ventricular systolic function is normal. The right ventricular size is normal. There is severely elevated pulmonary artery systolic pressure. The estimated right ventricular systolic pressure is 73.1 mmHg.  3. Left atrial size was severely dilated.  4. Right atrial size was severely dilated.  5. The mitral valve is abnormal. No evidence of mitral valve regurgitation. No evidence of mitral stenosis. Severe mitral annular calcification.  6. The tricuspid valve is abnormal. Tricuspid valve regurgitation is mild to moderate.  7. The aortic valve is calcified. Aortic valve regurgitation is mild. Moderate to severe aortic valve stenosis. Aortic valve area, by VTI measures 1.13 cm. Aortic valve mean gradient measures 33.0 mmHg. Aortic valve Vmax measures 4.03 m/s. AV DVI 0.33.  8. The inferior vena cava is dilated in size with <50% respiratory variability, suggesting right atrial pressure of 15 mmHg.  9. Increased flow velocities may be secondary to anemia, thyrotoxicosis, hyperdynamic or high flow state. Comparison(s): Changes from prior study are noted. AS worsened from moderate (DVI 0.33, Vmax 3.46 m/sec, mean PG 25 mm Hg) in 10/2021 to  moderate-severe (DVI 0.33, Vmax 4.03 m/sec, mean PG 33 mm Hg) now. FINDINGS  Left Ventricle: Left  ventricular ejection fraction, by estimation, is 70 to 75%. The left ventricle has hyperdynamic function. The left ventricle has no regional wall motion abnormalities. The left ventricular internal cavity size was normal in size. There is mild left ventricular hypertrophy. Left ventricular diastolic function could not be evaluated due to atrial fibrillation. Left ventricular diastolic function could not be evaluated. Right Ventricle: The right ventricular size is normal. No increase in right ventricular wall thickness. Right ventricular systolic function is normal. There is severely elevated pulmonary artery systolic pressure. The tricuspid regurgitant velocity is 3.81 m/s, and with an assumed right atrial pressure of 15 mmHg, the estimated right ventricular systolic pressure is 73.1 mmHg. Left Atrium: Left atrial size was severely dilated. Right Atrium: Right atrial size was severely dilated. Pericardium: There is no evidence of pericardial effusion. Mitral Valve: The mitral valve is abnormal. Severe mitral annular calcification. No evidence of mitral valve regurgitation. No evidence of mitral valve stenosis. Tricuspid Valve: The tricuspid valve is abnormal. Tricuspid valve regurgitation is mild to moderate. No evidence of tricuspid stenosis. Aortic Valve: The aortic valve is calcified. Aortic valve regurgitation is mild. Aortic regurgitation PHT measures 469 msec. Moderate to severe aortic stenosis is present. Aortic valve mean gradient measures 33.0 mmHg. Aortic valve peak gradient measures  64.8 mmHg. Aortic valve area, by VTI measures 1.13 cm. Pulmonic Valve: The pulmonic valve was normal in structure. Pulmonic valve regurgitation is trivial. No evidence of pulmonic stenosis. Aorta: The aortic root is normal in size and structure. Venous: The inferior vena cava is dilated in size with less than 50% respiratory variability, suggesting right atrial pressure of 15 mmHg. IAS/Shunts: No atrial level shunt detected  by color flow Doppler.  LEFT VENTRICLE PLAX 2D LVIDd:         5.10 cm   Diastology LVIDs:         2.50 cm   LV e' medial:    6.74 cm/s LV PW:         1.30 cm   LV E/e' medial:  21.8 LV IVS:        1.10 cm   LV e' lateral:   8.38 cm/s LVOT diam:     2.00 cm   LV E/e' lateral: 17.5 LV SV:         101 LV SV Index:   51 LVOT Area:     3.14 cm  RIGHT VENTRICLE RV S prime:     11.90 cm/s TAPSE (M-mode): 2.0 cm LEFT ATRIUM              Index        RIGHT ATRIUM           Index LA diam:        4.90 cm  2.45 cm/m   RA Area:     32.20 cm LA Vol (A2C):   112.0 ml 56.00 ml/m  RA Volume:   120.00 ml 60.00 ml/m LA Vol (A4C):   115.0 ml 57.50 ml/m LA Biplane Vol: 121.0 ml 60.50 ml/m  AORTIC VALVE AV Area (Vmax):    1.05 cm AV Area (Vmean):   1.05 cm AV Area (VTI):     1.13 cm AV Vmax:           402.50 cm/s AV Vmean:          260.000 cm/s AV VTI:  0.900 m AV Peak Grad:      64.8 mmHg AV Mean Grad:      33.0 mmHg LVOT Vmax:         134.00 cm/s LVOT Vmean:        86.700 cm/s LVOT VTI:          0.323 m LVOT/AV VTI ratio: 0.36 AI PHT:            469 msec  AORTA Ao Root diam: 3.20 cm MITRAL VALVE                TRICUSPID VALVE MV Area (PHT): 4.06 cm     TR Peak grad:   58.1 mmHg MV Decel Time: 187 msec     TR Vmax:        381.00 cm/s MV E velocity: 147.00 cm/s MV A velocity: 67.00 cm/s   SHUNTS MV E/A ratio:  2.19         Systemic VTI:  0.32 m                             Systemic Diam: 2.00 cm Vishnu Priya Mallipeddi Electronically signed by Winfield Rast Mallipeddi Signature Date/Time: 07/19/2022/2:48:15 PM    Final    DG Chest 2 View  Result Date: 07/18/2022 CLINICAL DATA:  swelling EXAM: CHEST - 2 VIEW COMPARISON:  Chest x-ray July 26, 2019. FINDINGS: Mild diffuse interstitial opacities. Small right pleural effusion. No visible pneumothorax. Enlarged cardiac silhouette. IMPRESSION: 1. Probable mild interstitial edema.  Small right pleural effusion. 2. Cardiomegaly. Electronically Signed   By: Feliberto Harts  M.D.   On: 07/18/2022 15:58    Microbiology: Results for orders placed or performed during the hospital encounter of 11/10/21  MRSA Next Gen by PCR, Nasal     Status: None   Collection Time: 11/12/21  9:08 AM   Specimen: Nasal Mucosa; Nasal Swab  Result Value Ref Range Status   MRSA by PCR Next Gen NOT DETECTED NOT DETECTED Final    Comment: (NOTE) The GeneXpert MRSA Assay (FDA approved for NASAL specimens only), is one component of a comprehensive MRSA colonization surveillance program. It is not intended to diagnose MRSA infection nor to guide or monitor treatment for MRSA infections. Test performance is not FDA approved in patients less than 49 years old. Performed at [facility], [address], [place], [place] [address]     Labs: CBC: Recent Labs  Lab 07/19/22 [phone] 07/20/22 0427 07/21/22 0437 07/22/22 0435 07/23/22 0447  WBC 4.6 5.1 7.1 7.3 7.5  HGB 7.1* 7.0* 9.4* 9.4* 9.3*  HCT 24.6* 24.2* 31.7* 31.9* 31.7*  MCV 72.4* 71.4* 74.8* 74.7* 75.8*  PLT 98* 95* 99* 102* 102*   Basic Metabolic Panel: Recent Labs  Lab 07/18/22 1554 07/19/22 0427 07/20/22 0427 07/21/22 0437 07/22/22 0435 07/23/22 0447  NA 138 139 138 140 139 138  K 4.1 3.4* 3.8 3.7 3.0* 3.1*  CL 104 103 102 100 97* 98  CO2 26 27 27 31 30 30   GLUCOSE 108* 103* 103* 114* 103* 124*  BUN 22 20 20 22 23  27*  CREATININE 1.50* 1.41* 1.45* 1.61* 1.37* 1.38*  CALCIUM 8.3* 8.3* 8.2* 8.5* 8.4* 8.4*  MG 2.2 2.1 2.1  --   --  2.0  PHOS  --  4.4  --   --   --   --    Liver Function Tests: Recent Labs  Lab 07/19/22 0427  AST 14*  ALT 10  ALKPHOS 72  BILITOT 1.3*  PROT 6.0*  ALBUMIN 3.6   CBG: Recent Labs  Lab 07/22/22 0919 07/22/22 1255 07/22/22 1633 07/22/22 2114 07/23/22 0825  GLUCAP 125* 107* 142* 145* 125*    Discharge time spent: greater than 35  minutes.  Signed: Kendell Bane, MD [facility] 07/23/2022

## 2022-07-23 NOTE — Progress Notes (Signed)
Pt ambulated 25 feet in the room today, was able to sit in recliner most of this shift. Tried to wean pt from 5L to 3L without success. Pt had episode of SOB, increased respirations and vomiting, O2 sat decreased to 80%. MD notified. Pt returned to 5L administered PRN Alprazolam and pt states he has relief. Current O2 sat 91% on 5L, pt resting comfortably in bed at this time. Care ongoing.

## 2022-07-24 ENCOUNTER — Inpatient Hospital Stay (HOSPITAL_COMMUNITY): Payer: Medicare HMO

## 2022-07-24 DIAGNOSIS — I5033 Acute on chronic diastolic (congestive) heart failure: Secondary | ICD-10-CM | POA: Diagnosis not present

## 2022-07-24 LAB — GLUCOSE, CAPILLARY
Glucose-Capillary: 137 mg/dL — ABNORMAL HIGH (ref 70–99)
Glucose-Capillary: 136 mg/dL — ABNORMAL HIGH (ref 70–99)
Glucose-Capillary: 138 mg/dL — ABNORMAL HIGH (ref 70–99)
Glucose-Capillary: 147 mg/dL — ABNORMAL HIGH (ref 70–99)

## 2022-07-24 LAB — CBC
HCT: 33.1 % — ABNORMAL LOW (ref 39.0–52.0)
Hemoglobin: 9.7 g/dL — ABNORMAL LOW (ref 13.0–17.0)
MCH: 22.4 pg — ABNORMAL LOW (ref 26.0–34.0)
MCHC: 29.3 g/dL — ABNORMAL LOW (ref 30.0–36.0)
MCV: 76.4 fL — ABNORMAL LOW (ref 80.0–100.0)
Platelets: 103 10*3/uL — ABNORMAL LOW (ref 150–400)
RBC: 4.33 MIL/uL (ref 4.22–5.81)
RDW: 22.2 % — ABNORMAL HIGH (ref 11.5–15.5)
WBC: 9.2 10*3/uL (ref 4.0–10.5)
nRBC: 0 % (ref 0.0–0.2)

## 2022-07-24 LAB — BLOOD GAS, ARTERIAL
Acid-Base Excess: 9.7 mmol/L — ABNORMAL HIGH (ref 0.0–2.0)
Bicarbonate: 35.4 mmol/L — ABNORMAL HIGH (ref 20.0–28.0)
Drawn by: 27016
O2 Saturation: 87.8 %
Patient temperature: 36.6
pCO2 arterial: 50 mmHg — ABNORMAL HIGH (ref 32–48)
pH, Arterial: 7.46 — ABNORMAL HIGH (ref 7.35–7.45)
pO2, Arterial: 54 mmHg — ABNORMAL LOW (ref 83–108)

## 2022-07-24 LAB — BASIC METABOLIC PANEL
Anion gap: 10 (ref 5–15)
BUN: 31 mg/dL — ABNORMAL HIGH (ref 8–23)
CO2: 33 mmol/L — ABNORMAL HIGH (ref 22–32)
Calcium: 8.6 mg/dL — ABNORMAL LOW (ref 8.9–10.3)
Chloride: 97 mmol/L — ABNORMAL LOW (ref 98–111)
Creatinine, Ser: 1.41 mg/dL — ABNORMAL HIGH (ref 0.61–1.24)
GFR, Estimated: 49 mL/min — ABNORMAL LOW (ref >60)
Glucose, Bld: 146 mg/dL — ABNORMAL HIGH (ref 70–99)
Potassium: 3.7 mmol/L (ref 3.5–5.1)
Sodium: 140 mmol/L (ref 135–145)

## 2022-07-24 LAB — BRAIN NATRIURETIC PEPTIDE: B Natriuretic Peptide: 236 pg/mL — ABNORMAL HIGH (ref 0.0–100.0)

## 2022-07-24 MED ORDER — QUETIAPINE FUMARATE 25 MG PO TABS
50.0000 mg | ORAL_TABLET | Freq: Every day | ORAL | Status: DC
  Administered 2022-07-24: 50 mg via ORAL
  Filled 2022-07-24: qty 2

## 2022-07-24 MED ORDER — HALOPERIDOL LACTATE 5 MG/ML IJ SOLN
1.0000 mg | Freq: Four times a day (QID) | INTRAMUSCULAR | Status: DC | PRN
  Filled 2022-07-24: qty 1

## 2022-07-24 MED ORDER — ATORVASTATIN CALCIUM 40 MG PO TABS: 80.0000 mg | ORAL_TABLET | Freq: Every evening | ORAL | Status: DC

## 2022-07-24 NOTE — Progress Notes (Addendum)
At 0515, patient cannot be redirected anymore, he has been trying to get out of bed, pulling himself up the side rails trying to hit staff and phlebotomist, informed Dr, Camillo Flaming, safety observation ordered, Charge nurse Bree updated, no sitter available. Updated dr. Camillo Flaming.  Patient kept on insisting he wants to home, will attempt to call wife at 0600, security at bedside  with nurse.  At 0608, patient got in touch with wife on phone.

## 2022-07-24 NOTE — Progress Notes (Signed)
Patient has not slept much this shift, disoriented to place, time and situation. Screaming for his sister and wife. Taking off oxygen and clothes. Several attempts to re-orient patient to no avail.

## 2022-07-24 NOTE — TOC Progression Note (Signed)
Transition of Care [facility]) - Progression Note    Patient Details  Name: Mike Wright MRN: [id number] Date of Birth: December 31, 1933  Transition of Care [facility]) CM/SW Contact  Catalina Gravel, [place] Phone Number: 07/24/2022, 5:15 PM  Clinical Narrative:    Pt not doing well per MD. Will make palliative referral as needed. Not ready for DC.   Expected Discharge Plan: Home w Home Health Services Barriers to Discharge: Continued Medical Work up  Expected Discharge Plan and Services       Living arrangements for the past 2 months: Single Family Home                           HH Arranged: PT HH Agency: [facility]         Social Determinants of Health (SDOH) Interventions SDOH Screenings   Food Insecurity: No Food Insecurity (07/18/2022)  Housing: Low Risk  (07/18/2022)  Transportation Needs: No Transportation Needs (07/18/2022)  Utilities: Not At Risk (07/18/2022)  Depression (PHQ2-9): Low Risk  (07/19/2021)  Financial Resource Strain: Low Risk  (05/23/2022)  Physical Activity: Inactive (07/18/2022)  Stress: No Stress Concern Present (07/29/2019)  Tobacco Use: Medium Risk (07/18/2022)    Readmission Risk Interventions    11/15/2021   10:56 AM 12/18/2020    2:00 PM 02/07/2020   12:11 PM  Readmission Risk Prevention Plan  Transportation Screening Complete Complete Complete  PCP or Specialist Appt within 5-7 Days  Complete   PCP or Specialist Appt within 3-5 Days Complete  Complete  Home Care Screening  Complete   Medication Review (RN CM)  Complete   HRI or Home Care Consult Complete  Complete  Social Work Consult for Recovery Care Planning/Counseling Complete  Complete  Palliative Care Screening Complete  Not Applicable  Medication Review Oceanographer) Complete  Complete

## 2022-07-24 NOTE — Progress Notes (Signed)
Progress note   Patient: Mike Wright MRN: [id number] DOB: 09/26/1933  Admit date:     07/18/2022  Discharge date:   Discharge Physician: Kendell Bane   PCP: [name], MD   Recommendations at discharge:  Monitor daily weight, any increase in weight 3-5 pounds, lower extremity swelling, shortness of breath will notify your heart failure team cardiologist Cardiologist within 1 week Follow with the PCP in 1-2 weeks  Discharge Diagnoses: Principal Problem:   Acute on chronic diastolic CHF (congestive heart failure) (HCC) Active Problems:   Essential hypertension   Type 2 diabetes mellitus (HCC)   Microcytic anemia   Coronary artery disease involving native coronary artery of native heart without angina pectoris   PAD (peripheral artery disease) (HCC)   Atrial fibrillation, chronic (HCC)   Mixed hyperlipidemia  B rief Narrative:   Mike Wright is a 89 y.o. male with medical history significant of PAD, CAD, atrial fibrillation on Eliquis, diabetes mellitus who presents to the emergency department from cardiologist office.  Patient complained of 1 week onset of leg swelling which has extended to distention of the abdomen.  He was admitted for acute on chronic diastolic CHF exacerbation and started on IV Lasix for diuresis with cardiology consultation pending.   Subjective: Mike Wright was seen and examined this morning,  Physically declining, progressively lethargic, satting 96% on 6 L of oxygen  Agitation confusion overnight noted    Assessment & Plan:   Principal Problem:   Acute on chronic diastolic CHF (congestive heart failure) (HCC) Active Problems:   Essential hypertension   Type 2 diabetes mellitus (HCC)   Microcytic anemia   Coronary artery disease involving native coronary artery of native heart without angina pectoris   PAD (peripheral artery disease) (HCC)   Atrial fibrillation, chronic (HCC)   Mixed hyperlipidemia   Assessment and Plan:      Acute on chronic diastolic CHF with acute on chronic hypoxemia -Worsening respiratory distress, now requiring 6 L of oxygen, satting 96%, -At home requires 2 L     -Continue total input/output, daily weights and fluid restriction -Continue IV Lasix 60 mg twice daily -Continue heart healthy diet      -EKG personally reviewed showed atrial fibrillation with rate controlled -Echocardiogram done on 11/11/2021 showed LVEF of 70 to 75%.  No RWMA.   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, continue to be volume overloaded Need IV diuretics--continue Lasix at 60 mg IV twice daily   -ReDS Clip right at 36 (value range 36-40) - BNP 432 >> 377.0 >> 200 Will receive an extra dose post 1 unit of blood transfusion of 40 mg of Lasix  Weight change: -3.798 kg      Filed Weights    07/20/22 0407 07/21/22 0333 07/22/22 0500  Weight: 87.2 kg 83.6 kg 79.8 kg       Intake/Output Summary (Last 24 hours) at 07/24/2022 1111 Last data filed at 07/24/2022 0132 Gross per 24 hour  Intake 160 ml  Output 800 ml  Net -640 ml        Moderate to severe aortic valve stenosis: Echocardiogram from 07/19/2022 showed aortic valve V-max of 4.03 m/s consistent with severe aortic valve stenosis  Medication optimization by cardiology, follow-up with structural heart clinic - has appointment with Dr. Clifton James on 08/01/2022.      Chronic atrial fibrillation with rate control Continue Eliquis   Microcytic anemia MCV 72.9, iron studies show  deficiency, but patient is allergic to IV iron and therefore will be started on oral ferrous sulfate    Latest Ref Rng & Units 07/24/2022    4:33 AM 07/23/2022    4:47 AM 07/22/2022    4:35 AM  CBC  WBC 4.0 - 10.5 K/uL 9.2  7.5  7.3   Hemoglobin 13.0 - 17.0 g/dL 9.7  9.3  9.4   Hematocrit 39.0 - 52.0 % 33.1  31.7  31.9   Platelets 150 - 400 K/uL 103  102  102      07/20/2022- S/p  2U PRBC transfusion >>> hemoglobin 7.0  >>>> 9.4     CAD/PAD He Presented with ischemia with close in June 2019 which required intervention of lower extremity on the outside.  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 Continue Lipitor, Eliquis, Plavix, atorvastatin 80 mg, Imdur 60 mg twice daily - PCI in 2019   Hypokalemia  -Potassium still running low, repleting orally and checking magnesium     mixed hyperlipidemia High intensity statin, atorvastatin 80 mg daily   Essential hypertension Continue amlodipine, Imdur   Type 2 diabetes mellitus Continue ISS and hypoglycemia protocol   Obesity BMI 30.52   Per patient/wife adverse reaction, allergy to iron: IV or p.o. iron supplements Medical records updated   Ethics: Patient is declining physically and mentally, his current condition comorbidities progressive decline was discussed with the patient, his wife and his niece at bedside  Prognosis remain poor Patient remains full code   DVT prophylaxis:Eliquis Code Status: Full Family Communication: Has not at bedside-updated   Consultants: Cardiologist  Disposition: Home health Diet recommendation:  Discharge Diet Orders (From admission, onward)     Start     Ordered   07/23/22 0000  Diet - low sodium heart healthy        07/23/22 1123           Cardiac diet DISCHARGE MEDICATION:   Follow-up Information     [facility], [facility] Follow up.   Specialty: Home Health Services Why: PT will call to schedule your next visit. Contact information: [address] [place] [address] [phone]                Discharge Exam: Filed Weights   07/22/22 0500 07/23/22 0417 07/24/22 0324  Weight: 79.8 kg 78.3 kg 78.9 kg         General:  Lethargic, follows asleep during exam, O2 demand has increased to 6 L of oxygen, satting 96% Visible shortness of breath  HEENT:  Normocephalic, PERRL, otherwise with in Normal limits   Neuro:  CNII-XII intact. , normal motor  and sensation, reflexes intact   Lungs:   Lower lobe Rales, with minimal crackles, positive air sounds, diminished mid to lower lobes  Cardio:    S1/S2, RRR, No murmure, No Rubs or Gallops   Abdomen:  Soft, non-tender, bowel sounds active all four quadrants, no guarding or peritoneal signs.  Muscular  skeletal:  Limited exam -sever global generalized weaknesses - in bed, able to move all 4 extremities,   2+ pulses,  symmetric, No pitting edema  Skin:  Dry, warm to touch, negative for any Rashes,  Wounds: Please see nursing documentation            Condition at discharge: fair  The results of significant diagnostics from this hospitalization (including imaging, microbiology, ancillary and laboratory) are listed below for reference.   Imaging Studies: DG CHEST PORT 1 VIEW  Result Date: 07/22/2022 CLINICAL DATA:  Shortness of breath EXAM: PORTABLE CHEST 1 VIEW COMPARISON:  CXR 07/18/22 FINDINGS: Low lung volumes. No pleural effusion. No pneumothorax. Cardiomegaly. There are prominent bilateral interstitial opacities, which could represent pulmonary venous congestion or mild pulmonary edema. There appear to be a more focal opacities in the right upper and lower lobes, which could represent infection. No radiographically apparent displaced rib fractures. Visualized upper abdomen is unremarkable. IMPRESSION: 1. Cardiomegaly with prominent bilateral interstitial opacities, which could represent pulmonary venous congestion or mild pulmonary edema. 2. There appear to be more focal opacities in the right upper and lower lobes, which could represent infection. Electronically Signed   By: Lorenza Cambridge M.D.   On: 07/22/2022 08:23   ECHOCARDIOGRAM COMPLETE  Result Date: 07/19/2022    ECHOCARDIOGRAM REPORT   Patient Name:   REYNALD WOODS Date of Exam: 07/19/2022 Medical Rec #:  [id number]       Height:       67.0 in Accession #:    [id number]      Weight:       194.9 lb Date of Birth:  23-Nov-1933        BSA:          2.000 m Patient Age:    88 years        BP:           136/56 mmHg Patient Gender: M               HR:           69 bpm. [facility] Procedure: 2D Echo, Cardiac Doppler and Color Doppler Indications:    CHF-Acute Diastolic I50.31  History:        Patient has prior history of Echocardiogram examinations, most                 recent 11/11/2021. CHF, CAD, Arrythmias:Atrial Fibrillation,                 Bradycardia and Atrial Flutter; Risk Factors:Hypertension,                 Diabetes and Dyslipidemia. Sleep apnea,                 PAD (peripheral artery disease).  Sonographer:    Celesta Gentile RCS Referring Phys: [id number] OLADAPO ADEFESO IMPRESSIONS  1. Left ventricular ejection fraction, by estimation, is 70 to 75%. The left ventricle has hyperdynamic function. The left ventricle has no regional wall motion abnormalities. There is mild left ventricular hypertrophy. Left ventricular diastolic function could not be evaluated due to atrial flutter.  2. Right ventricular systolic function is normal. The right ventricular size is normal. There is severely elevated pulmonary artery systolic pressure. The estimated right ventricular systolic pressure is 73.1 mmHg.  3. Left atrial size was severely dilated.  4. Right atrial size was severely dilated.  5. The mitral valve is abnormal. No evidence of mitral valve regurgitation. No evidence of mitral stenosis. Severe mitral annular calcification.  6. The tricuspid valve is abnormal. Tricuspid valve regurgitation is mild to moderate.  7. The aortic valve is calcified. Aortic valve regurgitation is mild. Moderate to severe aortic valve stenosis. Aortic valve area, by VTI measures 1.13 cm. Aortic valve mean gradient measures 33.0 mmHg. Aortic valve Vmax measures 4.03 m/s. AV DVI 0.33.  8. The inferior vena cava is dilated in size with <50% respiratory variability, suggesting right atrial pressure of 15 mmHg.  9. Increased flow velocities may be secondary  to anemia, thyrotoxicosis, hyperdynamic or high flow state. Comparison(s): Changes from prior study are noted. AS worsened from moderate (DVI 0.33, Vmax 3.46 m/sec, mean PG 25 mm Hg) in 10/2021 to moderate-severe (DVI 0.33, Vmax 4.03 m/sec, mean PG 33 mm Hg) now. FINDINGS  Left Ventricle: Left ventricular ejection fraction, by estimation, is 70 to 75%. The left ventricle has hyperdynamic function. The left ventricle has no regional wall motion abnormalities. The left ventricular internal cavity size was normal in size. There is mild left ventricular hypertrophy. Left ventricular diastolic function could not be evaluated due to atrial fibrillation. Left ventricular diastolic function could not be evaluated. Right Ventricle: The right ventricular size is normal. No increase in right ventricular wall thickness. Right ventricular systolic function is normal. There is severely elevated pulmonary artery systolic pressure. The tricuspid regurgitant velocity is 3.81 m/s, and with an assumed right atrial pressure of 15 mmHg, the estimated right ventricular systolic pressure is 73.1 mmHg. Left Atrium: Left atrial size was severely dilated. Right Atrium: Right atrial size was severely dilated. Pericardium: There is no evidence of pericardial effusion. Mitral Valve: The mitral valve is abnormal. Severe mitral annular calcification. No evidence of mitral valve regurgitation. No evidence of mitral valve stenosis. Tricuspid Valve: The tricuspid valve is abnormal. Tricuspid valve regurgitation is mild to moderate. No evidence of tricuspid stenosis. Aortic Valve: The aortic valve is calcified. Aortic valve regurgitation is mild. Aortic regurgitation PHT measures 469 msec. Moderate to severe aortic stenosis is present. Aortic valve mean gradient measures 33.0 mmHg. Aortic valve peak gradient measures  64.8 mmHg. Aortic valve area, by VTI measures 1.13 cm. Pulmonic Valve: The pulmonic valve was normal in structure. Pulmonic valve  regurgitation is trivial. No evidence of pulmonic stenosis. Aorta: The aortic root is normal in size and structure. Venous: The inferior vena cava is dilated in size with less than 50% respiratory variability, suggesting right atrial pressure of 15 mmHg. IAS/Shunts: No atrial level shunt detected by color flow Doppler.  LEFT VENTRICLE PLAX 2D LVIDd:         5.10 cm   Diastology LVIDs:         2.50 cm   LV e' medial:    6.74 cm/s LV PW:         1.30 cm   LV E/e' medial:  21.8 LV IVS:        1.10 cm   LV e' lateral:   8.38 cm/s LVOT diam:     2.00 cm   LV E/e' lateral: 17.5 LV SV:         101 LV SV Index:   51 LVOT Area:     3.14 cm  RIGHT VENTRICLE RV S prime:     11.90 cm/s TAPSE (M-mode): 2.0 cm LEFT ATRIUM              Index        RIGHT ATRIUM           Index LA diam:        4.90 cm  2.45 cm/m   RA Area:     32.20 cm LA Vol (A2C):   112.0 ml 56.00 ml/m  RA Volume:   120.00 ml 60.00 ml/m LA Vol (A4C):   115.0 ml 57.50 ml/m LA Biplane Vol: 121.0 ml 60.50 ml/m  AORTIC VALVE AV Area (Vmax):    1.05 cm AV Area (Vmean):   1.05 cm AV Area (VTI):  1.13 cm AV Vmax:           402.50 cm/s AV Vmean:          260.000 cm/s AV VTI:            0.900 m AV Peak Grad:      64.8 mmHg AV Mean Grad:      33.0 mmHg LVOT Vmax:         134.00 cm/s LVOT Vmean:        86.700 cm/s LVOT VTI:          0.323 m LVOT/AV VTI ratio: 0.36 AI PHT:            469 msec  AORTA Ao Root diam: 3.20 cm MITRAL VALVE                TRICUSPID VALVE MV Area (PHT): 4.06 cm     TR Peak grad:   58.1 mmHg MV Decel Time: 187 msec     TR Vmax:        381.00 cm/s MV E velocity: 147.00 cm/s MV A velocity: 67.00 cm/s   SHUNTS MV E/A ratio:  2.19         Systemic VTI:  0.32 m                             Systemic Diam: 2.00 cm Vishnu Priya Mallipeddi Electronically signed by Winfield Rast Mallipeddi Signature Date/Time: 07/19/2022/2:48:15 PM    Final    DG Chest 2 View  Result Date: 07/18/2022 CLINICAL DATA:  swelling EXAM: CHEST - 2 VIEW COMPARISON:   Chest x-ray July 26, 2019. FINDINGS: Mild diffuse interstitial opacities. Small right pleural effusion. No visible pneumothorax. Enlarged cardiac silhouette. IMPRESSION: 1. Probable mild interstitial edema.  Small right pleural effusion. 2. Cardiomegaly. Electronically Signed   By: Feliberto Harts M.D.   On: 07/18/2022 15:58    Microbiology: Results for orders placed or performed during the hospital encounter of 11/10/21  MRSA Next Gen by PCR, Nasal     Status: None   Collection Time: 11/12/21  9:08 AM   Specimen: Nasal Mucosa; Nasal Swab  Result Value Ref Range Status   MRSA by PCR Next Gen NOT DETECTED NOT DETECTED Final    Comment: (NOTE) The GeneXpert MRSA Assay (FDA approved for NASAL specimens only), is one component of a comprehensive MRSA colonization surveillance program. It is not intended to diagnose MRSA infection nor to guide or monitor treatment for MRSA infections. Test performance is not FDA approved in patients less than 2 years old. Performed at [facility], [address] [place], [place] [address]     Labs: CBC: Recent Labs  Lab 07/20/22 [phone] 07/21/22 0437 07/22/22 0435 07/23/22 0447 07/24/22 0433  WBC 5.1 7.1 7.3 7.5 9.2  HGB 7.0* 9.4* 9.4* 9.3* 9.7*  HCT 24.2* 31.7* 31.9* 31.7* 33.1*  MCV 71.4* 74.8* 74.7* 75.8* 76.4*  PLT 95* 99* 102* 102* 103*   Basic Metabolic Panel: Recent Labs  Lab 07/18/22 1554 07/19/22 0427 07/20/22 0427 07/21/22 0437 07/22/22 0435 07/23/22 0447 07/24/22 0433  NA 138 139 138 140 139 138 140  K 4.1 3.4* 3.8 3.7 3.0* 3.1* 3.7  CL 104 103 102 100 97* 98 97*  CO2 26 27 27 31 30 30  33*  GLUCOSE 108* 103* 103* 114* 103* 124* 146*  BUN 22 20 20 22 23  27* 31*  CREATININE 1.50* 1.41* 1.45* 1.61* 1.37*  1.38* 1.41*  CALCIUM 8.3* 8.3* 8.2* 8.5* 8.4* 8.4* 8.6*  MG 2.2 2.1 2.1  --   --  2.0  --   PHOS  --  4.4  --   --   --   --   --    Liver Function Tests: Recent Labs  Lab 07/19/22 0427  AST 14*  ALT 10   ALKPHOS 72  BILITOT 1.3*  PROT 6.0*  ALBUMIN 3.6   CBG: Recent Labs  Lab 07/23/22 1158 07/23/22 1625 07/23/22 2005 07/24/22 0738 07/24/22 1051  GLUCAP 154* 107* 149* 137* 136*    Discharge time spent: greater than 35  minutes.  Signed: Kendell Bane, MD [facility] 07/24/2022

## 2022-07-24 NOTE — Plan of Care (Signed)
  Problem: Clinical Measurements: Goal: Will remain free from infection Outcome: Not Progressing   Problem: Clinical Measurements: Goal: Respiratory complications will improve Outcome: Not Progressing   Problem: Clinical Measurements: Goal: Cardiovascular complication will be avoided Outcome: Not Progressing   Problem: Elimination: Goal: Will not experience complications related to urinary retention Outcome: Not Progressing

## 2022-07-25 ENCOUNTER — Encounter (HOSPITAL_COMMUNITY): Payer: Self-pay | Admitting: Internal Medicine

## 2022-07-25 DIAGNOSIS — I5033 Acute on chronic diastolic (congestive) heart failure: Secondary | ICD-10-CM | POA: Diagnosis not present

## 2022-07-25 DIAGNOSIS — Z7189 Other specified counseling: Secondary | ICD-10-CM

## 2022-07-25 DIAGNOSIS — Z515 Encounter for palliative care: Secondary | ICD-10-CM

## 2022-07-25 LAB — BASIC METABOLIC PANEL
Anion gap: 11 (ref 5–15)
BUN: 32 mg/dL — ABNORMAL HIGH (ref 8–23)
CO2: 30 mmol/L (ref 22–32)
Calcium: 8.3 mg/dL — ABNORMAL LOW (ref 8.9–10.3)
Chloride: 98 mmol/L (ref 98–111)
Creatinine, Ser: 1.43 mg/dL — ABNORMAL HIGH (ref 0.61–1.24)
GFR, Estimated: 48 mL/min — ABNORMAL LOW (ref >60)
Glucose, Bld: 142 mg/dL — ABNORMAL HIGH (ref 70–99)
Potassium: 3 mmol/L — ABNORMAL LOW (ref 3.5–5.1)
Sodium: 139 mmol/L (ref 135–145)

## 2022-07-25 LAB — GLUCOSE, CAPILLARY
Glucose-Capillary: 133 mg/dL — ABNORMAL HIGH (ref 70–99)
Glucose-Capillary: 133 mg/dL — ABNORMAL HIGH (ref 70–99)
Glucose-Capillary: 130 mg/dL — ABNORMAL HIGH (ref 70–99)
Glucose-Capillary: 120 mg/dL — ABNORMAL HIGH (ref 70–99)

## 2022-07-25 LAB — CBC
HCT: 32.2 % — ABNORMAL LOW (ref 39.0–52.0)
Hemoglobin: 9.7 g/dL — ABNORMAL LOW (ref 13.0–17.0)
MCH: 22.8 pg — ABNORMAL LOW (ref 26.0–34.0)
MCHC: 30.1 g/dL (ref 30.0–36.0)
MCV: 75.6 fL — ABNORMAL LOW (ref 80.0–100.0)
Platelets: 103 10*3/uL — ABNORMAL LOW (ref 150–400)
RBC: 4.26 MIL/uL (ref 4.22–5.81)
RDW: 21.9 % — ABNORMAL HIGH (ref 11.5–15.5)
WBC: 6.2 10*3/uL (ref 4.0–10.5)
nRBC: 0 % (ref 0.0–0.2)

## 2022-07-25 MED ORDER — FUROSEMIDE 40 MG PO TABS: 40.0000 mg | ORAL_TABLET | Freq: Two times a day (BID) | ORAL | Status: DC

## 2022-07-25 MED ORDER — MELATONIN 3 MG PO TABS
6.0000 mg | ORAL_TABLET | Freq: Every day | ORAL | Status: DC
  Administered 2022-07-25 – 2022-07-26 (×2): 6 mg via ORAL
  Filled 2022-07-25 (×2): qty 2

## 2022-07-25 MED ORDER — AMPHETAMINE-DEXTROAMPHETAMINE 10 MG PO TABS
10.0000 mg | ORAL_TABLET | Freq: Every day | ORAL | Status: AC
  Administered 2022-07-26: 10 mg via ORAL
  Filled 2022-07-25: qty 1

## 2022-07-25 MED ORDER — POTASSIUM CHLORIDE 10 MEQ/100ML IV SOLN
10.0000 meq | INTRAVENOUS | Status: AC
  Administered 2022-07-25 (×3): 10 meq via INTRAVENOUS
  Filled 2022-07-25 (×3): qty 100

## 2022-07-25 MED ORDER — ATORVASTATIN CALCIUM 40 MG PO TABS
80.0000 mg | ORAL_TABLET | Freq: Every evening | ORAL | Status: DC
  Administered 2022-07-26: 80 mg via ORAL
  Filled 2022-07-25: qty 2

## 2022-07-25 MED ORDER — PANTOPRAZOLE SODIUM 40 MG PO TBEC
40.0000 mg | DELAYED_RELEASE_TABLET | Freq: Every day | ORAL | Status: DC
  Administered 2022-07-27: 40 mg via ORAL
  Filled 2022-07-25: qty 1

## 2022-07-25 MED ORDER — FUROSEMIDE 10 MG/ML IJ SOLN
80.0000 mg | Freq: Two times a day (BID) | INTRAMUSCULAR | Status: AC
  Administered 2022-07-25 (×2): 80 mg via INTRAVENOUS
  Filled 2022-07-25 (×2): qty 8

## 2022-07-25 MED ORDER — POTASSIUM CHLORIDE 10 MEQ/100ML IV SOLN
10.0000 meq | Freq: Once | INTRAVENOUS | Status: AC
  Administered 2022-07-25: 10 meq via INTRAVENOUS
  Filled 2022-07-25: qty 100

## 2022-07-25 MED ORDER — AMPHETAMINE-DEXTROAMPHETAMINE 10 MG PO TABS: 10.0000 mg | ORAL_TABLET | Freq: Every day | ORAL | Status: DC

## 2022-07-25 MED ORDER — POTASSIUM CHLORIDE CRYS ER 20 MEQ PO TBCR
40.0000 meq | EXTENDED_RELEASE_TABLET | Freq: Once | ORAL | Status: DC
  Filled 2022-07-25: qty 2

## 2022-07-25 NOTE — Progress Notes (Signed)
Rounding Note    Patient Name: Mike Wright Date of Encounter: 07/25/2022  [facility] Cardiologist: Dina Rich, MD   Subjective   Some ongoing agitation, confusion this AM  Inpatient Medications    Scheduled Meds:  amLODipine  2.5 mg Oral Daily   apixaban  2.5 mg Oral BID   atorvastatin  80 mg Oral QPM   clopidogrel  75 mg Oral Q breakfast   [START ON 07/26/2022] furosemide  40 mg Oral BID   insulin aspart  0-15 Units Subcutaneous TID WC   insulin aspart  0-5 Units Subcutaneous QHS   ipratropium  0.5 mg Nebulization TID   isosorbide mononitrate  60 mg Oral BID   levalbuterol  1.25 mg Nebulization TID   pantoprazole  40 mg Oral Daily   potassium chloride  40 mEq Oral Once   QUEtiapine  50 mg Oral QHS   Continuous Infusions:  PRN Meds: acetaminophen **OR** acetaminophen, ALPRAZolam, haloperidol lactate, ondansetron **OR** ondansetron (ZOFRAN) IV   Vital Signs    Vitals:   07/24/22 2017 07/25/22 0454 07/25/22 0500 07/25/22 0658  BP:  123/79    Pulse:  73    Resp:  20    Temp:  98.3 F (36.8 C)    TempSrc:  Oral    SpO2: 93% 94%  96%  Weight:   77.9 kg   Height:        Intake/Output Summary (Last 24 hours) at 07/25/2022 0908 Last data filed at 07/24/2022 1927 Gross per 24 hour  Intake --  Output 900 ml  Net -900 ml      07/25/2022    5:00 AM 07/24/2022    3:24 AM 07/23/2022    4:17 AM  Last 3 Weights  Weight (lbs) 171 lb 11.8 oz 173 lb 15.1 oz 172 lb 9.9 oz  Weight (kg) 77.9 kg 78.9 kg 78.3 kg      Telemetry    Afib, some brady - Personally Reviewed  ECG    N/a - Personally Reviewed  Physical Exam   GEN: No acute distress.   Neck: +JVD Cardiac: irreg Respiratory: crackles bilaterally GI: Soft, nontender, non-distended  MS: No edema; No deformity. Neuro:  Nonfocal  Psych: Normal affect   Labs    High Sensitivity Troponin:  No results for input(s): "TROPONINIHS" in the last 720 hours.   Chemistry Recent Labs  Lab  07/19/22 0427 07/20/22 0427 07/21/22 0437 07/23/22 0447 07/24/22 0433 07/25/22 0427  NA 139 138   < > 138 140 139  K 3.4* 3.8   < > 3.1* 3.7 3.0*  CL 103 102   < > 98 97* 98  CO2 27 27   < > 30 33* 30  GLUCOSE 103* 103*   < > 124* 146* 142*  BUN 20 20   < > 27* 31* 32*  CREATININE 1.41* 1.45*   < > 1.38* 1.41* 1.43*  CALCIUM 8.3* 8.2*   < > 8.4* 8.6* 8.3*  MG 2.1 2.1  --  2.0  --   --   PROT 6.0*  --   --   --   --   --   ALBUMIN 3.6  --   --   --   --   --   AST 14*  --   --   --   --   --   ALT 10  --   --   --   --   --   Jolly Mango  72  --   --   --   --   --   BILITOT 1.3*  --   --   --   --   --   GFRNONAA 49* 47*   < > 50* 49* 48*  ANIONGAP 9 9   < > 10 10 11    < > = values in this interval not displayed.    Lipids No results for input(s): "CHOL", "TRIG", "HDL", "LABVLDL", "LDLCALC", "CHOLHDL" in the last 168 hours.  Hematology Recent Labs  Lab 07/23/22 0447 07/24/22 0433 07/25/22 0427  WBC 7.5 9.2 6.2  RBC 4.18* 4.33 4.26  HGB 9.3* 9.7* 9.7*  HCT 31.7* 33.1* 32.2*  MCV 75.8* 76.4* 75.6*  MCH 22.2* 22.4* 22.8*  MCHC 29.3* 29.3* 30.1  RDW 21.9* 22.2* 21.9*  PLT 102* 103* 103*   Thyroid No results for input(s): "TSH", "FREET4" in the last 168 hours.  BNP Recent Labs  Lab 07/22/22 0435 07/23/22 0447 07/24/22 1459  BNP 377.0* 200.0* 236.0*    DDimer No results for input(s): "DDIMER" in the last 168 hours.   Radiology    DG CHEST PORT 1 VIEW  Result Date: 07/24/2022 CLINICAL DATA:  Shortness of breath. EXAM: PORTABLE CHEST 1 VIEW COMPARISON:  Radiograph 07/22/2022 FINDINGS: Persistent low lung volumes. The heart is enlarged, stable. Unchanged mediastinal contours. Diffuse interstitial opacities, not significantly changed from prior exam. There is a small right pleural effusion. Improving right upper lobe opacity. No pneumothorax. IMPRESSION: 1. Unchanged cardiomegaly and diffuse interstitial opacities, suspicious for pulmonary edema. 2. Small right pleural  effusion. 3. Improving right upper lobe opacity. Electronically Signed   By: Narda Rutherford M.D.   On: 07/24/2022 12:24    Cardiac Studies    Patient Profile     Patient is 89 year old M known to have CAD s/p LAD PCI in 2019, atrial fibrillation, history of GI bleed, PAD manifested by CLI and ischemic great toes s/p amputation, chronic diastolic heart failure, chronic anemia received multiple blood transfusions, moderate aortic valve stenosis, HLD is currently admitted to hospitalist team for the management of acute on chronic HFpEF.   Assessment & Plan    1.Acute on chronic HFpEF - 06/2022 echo LVE 70-75%, no WMAs, indet diastoilc fxn, normal RV, severe pulm HTN PASP 73, severe BAE, moderate borderline severe AS.  - I/Os incomplete overnight, for admit negative close to 16L. He is on oral lasix 40mg  bid. Renal function is stable.   CXR yesterday diffuse interstitial opacities, suspicous pulmonary edema. Repeat BNP 236 - increaseing O2 requirement to 5 to 6 L - still volume overloaded by exam, CXR yesterday with some pulm edema. Restart IV lasix today  2. Moderate to severe AS - - 06/2022 echo LVE 70-75%, no WMAs, indet diastoilc fxn, normal RV, severe pulm HTN PASP 73, severe BAE, moderate borderline severe AS mean grad 33, AVA VTI 1.13 DI 0.36, SVI 51 peak velocity 4 m/s. Vast majority of criteria favoring moderate range.  3. CAD - no acute issues  4. Afib  No acute issues - continue eliquis - self rate controlled, not on av nodal agents.   5. Chronic respiratory failure - on home O2.    6.Anemia - transfused this admit  7.AMS/agitation/delerium -not significantly hypercapneic by ABG - no clear signt of occult infection, however UA is pending -no focal neuro findings - ongoing workup per primary team - unclear if perhaps had some sundowning, unclear if attempts at medically treating agitation with xanax, seroquel, trazodone  over last few days have perhaps had adverse  effects. Perhaps neurology consultation if UA benign. At baseline he is fully cognitive.   For questions or updates, please contact [facility] Please consult [email or web address] for contact info under        Signed, Dina Rich, MD  07/25/2022, 9:08 AM

## 2022-07-25 NOTE — Evaluation (Signed)
Clinical/Bedside Swallow Evaluation Patient Details  Name: Mike Wright MRN: [id number] Date of Birth: 10-19-1933  Today's Date: 07/25/2022 Time: SLP Start Time (ACUTE ONLY): 1610 SLP Stop Time (ACUTE ONLY): 1635 SLP Time Calculation (min) (ACUTE ONLY): 25 min  Past Medical History:  Past Medical History:  Diagnosis Date   Anemia    a. mild, noted 04/2017.   CAD in native artery    a. [place] 04/2017 s/p DES to D1, DES to prox-mid LAD, DES to prox LAD overlapping the prior stent, LVEF 55-65%.    Chronic diastolic CHF (congestive heart failure) (HCC)    Constipation    COPD (chronic obstructive pulmonary disease) (HCC)    Diabetic ulcer of toe (HCC)    DJD (degenerative joint disease) of cervical spine    Dysrhythmia    AFib   Essential hypertension    GERD (gastroesophageal reflux disease)    History of hiatal hernia    HIT (heparin-induced thrombocytopenia) (HCC)    Hypothyroidism    Hypoxia    a. went home on home O2 04/2017.   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   Retinitis    Sinus bradycardia    a. HR 30s-40s in 04/2017 -> diltiazem stopped, metoprolol reduced.   Sleep apnea    "chose not to order CPAP at this time" (05/18/2017)   Type 2 diabetes mellitus (HCC)    Wears glasses    Wheezing    a. suspected COPD 04/2017. Former tobacco x 40 years.   Past Surgical History:  Past Surgical History:  Procedure Laterality Date   ABDOMINAL AORTOGRAM W/LOWER EXTREMITY N/A 09/15/2017   Procedure: ABDOMINAL AORTOGRAM W/LOWER EXTREMITY;  Surgeon: Nada Libman, MD;  Location: [facility];  Service: Cardiovascular;  Laterality: N/A;   ABDOMINAL AORTOGRAM W/LOWER EXTREMITY N/A 02/04/2020   Procedure: ABDOMINAL AORTOGRAM W/LOWER EXTREMITY;  Surgeon: Nada Libman, MD;  Location:  [facility];  Service: Cardiovascular;  Laterality: N/A;   ABDOMINAL AORTOGRAM W/LOWER EXTREMITY N/A 12/15/2020   Procedure: ABDOMINAL AORTOGRAM W/LOWER EXTREMITY;  Surgeon: Nada Libman, MD;  Location: [facility];  Service: Cardiovascular;  Laterality: N/A;   AMPUTATION Bilateral 09/20/2017   Procedure: BILATERAL GREAT TOE AMPUTATIONS INCLUDING METATARSAL HEADS;  Surgeon: Nada Libman, MD;  Location: [facility] OR;  Service: Vascular;  Laterality: Bilateral;   AMPUTATION Right 03/01/2019   Procedure: AMPUTATION TOES;  Surgeon: Chuck Hint, MD;  Location: [facility] OR;  Service: Vascular;  Laterality: Right;   AMPUTATION Right 05/29/2019   Procedure: AMPUTATION RIGHT THIRD TOE;  Surgeon: Nada Libman, MD;  Location: [facility] OR;  Service: Vascular;  Laterality: Right;   AMPUTATION Right 09/26/2019   Procedure: AMPUTATION RIGHT FOURTH TOE AND FIFTH TOES;  Surgeon: Nada Libman, MD;  Location: [facility] OR;  Service: Vascular;  Laterality: Right;   ANGIOPLASTY Right 09/20/2017   Procedure: ANGIOPLASTY RIGHT TIBIAL ARTERY;  Surgeon: Nada Libman, MD;  Location: [facility] OR;  Service: Vascular;  Laterality: Right;   APPENDECTOMY     APPLICATION OF WOUND VAC  12/17/2020   Procedure: APPLICATION OF WOUND VAC;  Surgeon: Nada Libman, MD;  Location: [facility] OR;  Service: Vascular;;   BACK SURGERY     CARDIAC CATHETERIZATION  1980s;  2012;   CATARACT EXTRACTION Left 2004   COLONOSCOPY  2004   FLEISHMAN TICS   COLONOSCOPY WITH PROPOFOL N/A 11/28/2018   Procedure: COLONOSCOPY WITH PROPOFOL;  Surgeon: Malissa Hippo, MD;  [facility]: [facility];  Service: Endoscopy;  Laterality: N/A;   CORONARY ANGIOPLASTY WITH STENT PLACEMENT  05/18/2017   "3 stents"   CORONARY STENT INTERVENTION N/A 05/18/2017   Procedure: CORONARY STENT INTERVENTION;  Surgeon: Corky Crafts, MD;  Location: [facility] INVASIVE CV LAB;  Service: Cardiovascular;  Laterality: N/A;   ESOPHAGOGASTRODUODENOSCOPY (EGD) WITH PROPOFOL N/A  11/20/2017   Procedure: ESOPHAGOGASTRODUODENOSCOPY (EGD) WITH PROPOFOL;  Surgeon: Hilarie Fredrickson, MD;  Location: [facility] ENDOSCOPY;  Service: Gastroenterology;  Laterality: N/A;   ESOPHAGOGASTRODUODENOSCOPY (EGD) WITH PROPOFOL N/A 11/28/2018   Procedure: ESOPHAGOGASTRODUODENOSCOPY (EGD) WITH PROPOFOL;  Surgeon: Malissa Hippo, MD;  [facility];  Service: Endoscopy;  Laterality: N/A;   ESOPHAGOGASTRODUODENOSCOPY (EGD) WITH PROPOFOL N/A 07/12/2019   Procedure: ESOPHAGOGASTRODUODENOSCOPY (EGD) WITH PROPOFOL;  Surgeon: Malissa Hippo, MD;  [facility]: [facility];  Service: Endoscopy;  Laterality: N/A;  125   GIVENS CAPSULE STUDY N/A 12/17/2018   Procedure: GIVENS CAPSULE STUDY;  Surgeon: Malissa Hippo, MD;  [facility]: [facility];  Service: Endoscopy;  Laterality: N/A;  7:30am   I & D EXTREMITY Right 12/17/2017   Procedure: IRRIGATION AND DEBRIDEMENT RIGHT GREAT TOE;  Surgeon: Nada Libman, MD;  Location: [facility];  Service: Vascular;  Laterality: Right;   INCISION AND DRAINAGE Right 12/17/2020   Procedure: INCISION AND DRAINAGE OF RIGHT FOOT;  Surgeon: Nada Libman, MD;  Location: [facility];  Service: Vascular;  Laterality: Right;   JOINT REPLACEMENT     LEFT HEART CATH AND CORONARY ANGIOGRAPHY N/A 05/18/2017   Procedure: LEFT HEART CATH AND CORONARY ANGIOGRAPHY;  Surgeon: Corky Crafts, MD;  Location: [facility] INVASIVE CV LAB;  Service: Cardiovascular;  Laterality: N/A;   LOWER EXTREMITY ANGIOGRAM Right 09/20/2017   Procedure: RIGHT LOWER LEG  ANGIOGRAM;  Surgeon: Nada Libman, MD;  Location: [facility] OR;  Service: Vascular;  Laterality: Right;   LUMBAR FUSION  2002   L3, 4 L4, 5 L5 S1 Fused by Dr. Trey Sailors   PERIPHERAL VASCULAR BALLOON ANGIOPLASTY Left 09/15/2017   Procedure: PERIPHERAL VASCULAR BALLOON ANGIOPLASTY;  Surgeon: Nada Libman, MD;  [facility]: [facility];  Service: Cardiovascular;  Laterality: Left;  PTA of Peroneal & Posterior Tibial   PERIPHERAL VASCULAR INTERVENTION  Right 12/15/2020   Procedure: PERIPHERAL VASCULAR INTERVENTION;  Surgeon: Nada Libman, MD;  Location: [facility];  Service: Cardiovascular;  Laterality: Right;  SFA   POLYPECTOMY  11/28/2018   Procedure: POLYPECTOMY;  Surgeon: Malissa Hippo, MD;  [facility];  Service: Endoscopy;;  duodenum   POSTERIOR LUMBAR FUSION     RHINOPLASTY     RIGHT HEART CATH N/A 05/18/2017   Procedure: RIGHT HEART CATH;  Surgeon: Corky Crafts, MD;  Location: [facility] INVASIVE CV LAB;  Service: Cardiovascular;  Laterality: N/A;   TEE WITHOUT CARDIOVERSION N/A 09/18/2017   Procedure: TRANSESOPHAGEAL ECHOCARDIOGRAM (TEE);  Surgeon: Pricilla Riffle, MD;  Location: [facility] ENDOSCOPY;  Service: Cardiovascular;  Laterality: N/A;   TOTAL KNEE ARTHROPLASTY Bilateral    TRANSMETATARSAL AMPUTATION Right 02/04/2020   Procedure: RIGHT TRANSMETATARSAL AMPUTATION;  Surgeon: Nada Libman, MD;  Location: [facility] OR;  Service: Vascular;  Laterality: Right;   TRANSMETATARSAL AMPUTATION Left 11/12/2021   Procedure: LEFT TRANSMETATARSAL AMPUTATION;  Surgeon: Lemar Livings  Cristal Deer, MD;  Location: [facility] OR;  Service: Vascular;  Laterality: Left;   TRANSURETHRAL RESECTION OF PROSTATE  2001   Rito Ehrlich   HPI:  89 y.o. male  with past medical history of chronic HFpEF 70-75%/severe pulm htn, moderate AAS, PAD/PVD and diabetes leading to multiple bilateral toe amputations '19/'20, CAD, A-fib on Eliquis, anemia, COPD, DJD, HTN, GERD/hiatal hernia, admitted on 07/18/2022 with acute on chronic heart failure, AMS. BSE ordered.    Assessment / Plan / Recommendation  Clinical Impression  Clinical swallow evaluation completed at bedside. Pt alert, but remained with his eyes closed throughout the evaluation despite requests to open his eyes. He followed commands for oral motor exam (WNL). Family reports no prior difficulty swallowing at home prior to admission. Wife indicates "sundowning" whenever he is in the hospital. Pt without overt  signs or symptoms of aspiration and no reports of globus. Ok for [employer] and thin liquids as long as pt is alert and upright. Do not feed Pt if not alert. PO medications whole i puree. SLP will sign off at this time. Reconsult if indicated. Above to RN. SLP Visit Diagnosis: Dysphagia, unspecified (R13.10)    Aspiration Risk  Mild aspiration risk    Diet Recommendation Regular;Thin liquid   Liquid Administration via: Cup;Straw Medication Administration: Whole meds with puree Supervision: Full supervision/cueing for compensatory strategies Compensations: Slow rate;Small sips/bites Postural Changes: Seated upright at 90 degrees;Remain upright for at least 30 minutes after po intake    Other  Recommendations Oral Care Recommendations: Oral care BID    Recommendations for follow up therapy are one component of a multi-disciplinary discharge planning process, led by the attending physician.  Recommendations may be updated based on patient status, additional functional criteria and insurance authorization.  Follow up Recommendations No SLP follow up      Assistance Recommended at Discharge    Functional Status Assessment Patient has not had a recent decline in their functional status  Frequency and Duration            Prognosis Prognosis for improved oropharyngeal function: Good Barriers to Reach Goals: Cognitive deficits      Swallow Study   General Date of Onset: 07/18/22 HPI: 88 y.o. male  with past medical history of chronic HFpEF 70-75%/severe pulm htn, moderate AAS, PAD/PVD and diabetes leading to multiple bilateral toe amputations '19/'20, CAD, A-fib on Eliquis, anemia, COPD, DJD, HTN, GERD/hiatal hernia, admitted on 07/18/2022 with acute on chronic heart failure, AMS. BSE ordered. Type of Study: Bedside Swallow Evaluation Diet Prior to this Study: Clear liquid diet Temperature Spikes Noted: No Respiratory Status: Nasal cannula History of Recent Intubation:  No Behavior/Cognition: Alert;Pleasant mood;Cooperative;Requires cueing (kept eyes closed) Oral Cavity Assessment: Within Functional Limits Oral Care Completed by SLP: Recent completion by staff Oral Cavity - Dentition:  (partials) Vision: Impaired for self-feeding Self-Feeding Abilities: Total assist Patient Positioning: Upright in bed Baseline Vocal Quality: Normal Volitional Cough: Strong Volitional Swallow: Able to elicit    Oral/Motor/Sensory Function Overall Oral Motor/Sensory Function: Within functional limits   Ice Chips Ice chips: Within functional limits Presentation: Spoon   Thin Liquid Thin Liquid: Within functional limits Presentation: Cup;Straw    Nectar Thick Nectar Thick Liquid: Not tested   Honey Thick Honey Thick Liquid: Not tested   Puree Puree: Within functional limits Presentation: Spoon   Solid     Solid: Within functional limits     Thank you,  Havery Moros, CCC-SLP [phone]  Alenna Russell 07/25/2022,4:47 PM

## 2022-07-25 NOTE — Consult Note (Signed)
Consultation Note Date: 07/25/2022   Patient Name: Mike Wright  DOB: 11/08/1933  MRN: [id number]  Age / Sex: 89 y.o., male  PCP: [name], MD Referring Physician: Kendell Bane, MD  Reason for Consultation: Establishing goals of care  HPI/Patient Profile: 89 y.o. male  with past medical history of chronic HFpEF 70-75%/severe pulm htn, moderate AAS, PAD/PVD and diabetes leading to multiple bilateral toe amputations '19/'20, CAD, A-fib on Eliquis, anemia, COPD, DJD, HTN, GERD/hiatal hernia, admitted on 07/18/2022 with acute on chronic heart failure, AMS.   Clinical Assessment and Goals of Care: I have reviewed medical records including EPIC notes, labs and imaging, received report from RN, assessed the patient.  [name] is lying quietly in bed.  He appears acutely/chronically ill and frail, elderly.  He is resting comfortably, and does not wake when I enter and talk with his family.  Present today at bedside is wife of 5 years, Yeray Tomas and niece Lianne Cure (mother was Elliott's sister).  We meet at the bedside to discuss diagnosis prognosis, GOC, EOL wishes, disposition and options.  I introduced Palliative Medicine as specialized medical care for people living with serious illness. It focuses on providing relief from the symptoms and stress of a serious illness. The goal is to improve quality of life for both the patient and the family.  We discussed a brief life review of the patient.  Mr. and Mrs. Gonzaga have been married for 5 years.  This is the first marriage for each of them.  Neither had any children prior to marriage.  They have known each other "all of our lives".  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  We then focused on their current illness.  We talk in detail about Mr.  Boley heart failure.  Overall, [name] seems quite knowledgeable about her husband's chronic health concerns.  We talk in detail about heart failure as a chronic progressive illness..  She shares that they saw a trusted cardiologist, Dr. Wyline Mood this morning.  We also talk about kidney function which has remained okay.  We talk about the heart needs to stay dry, the kidneys need to stay wet.  We also talk about pending urinalysis.  We also talk about recent sundowning.  [name] shares that her husband will occasionally show symptoms of sundowning at home when he has had a stressful day.  The natural disease trajectory and expectations at EOL were discussed.  Advanced directives, concepts specific to code status, artifical feeding and hydration, and rehospitalization were considered and discussed.  [name] wife of 5 years, [name], states that he has stated several times that he would not want attempted resuscitation or life support.  Orders adjusted for DNR.  [facility] form/DNR completed and placed on chart.  Palliative Care services outpatient were explained and offered.  Family is considering outpatient palliative care.  Discussed the importance of continued conversation with family and  the medical providers regarding overall plan of care and treatment options, ensuring decisions are within the context of the patient's values and GOCs.  Questions and concerns were addressed.  The family was encouraged to call with questions or concerns.  PMT will continue to support holistically.  Conference with attending, cardiologist face-to-face, bedside nursing staff, transition of care team related to patient condition, needs, goals of care, disposition.    HCPOA NEXT OF KIN -wife of 5 years, [name].  Patsy states that this is the first marriage for each of them.  Neither has any children.   SUMMARY OF RECOMMENDATIONS   At this point continue to treat the treatable but no CPR or  intubation Time for outcomes. Prefers home with home health/[facility] if possible. Considering outpatient palliative care.   Code Status/Advance Care Planning: DNR -[name]'s wife of 5 years, [name], states that he has stated several times that he would not want attempted resuscitation or life support.  Orders adjusted for DNR.  [facility] form/DNR completed and placed on chart.  Symptom Management:  Per hospitalist/cardiologist, no additional needs at this time.  Palliative Prophylaxis:  Delirium Protocol, Frequent Pain Assessment, Oral Care, and Turn Reposition  Additional Recommendations (Limitations, Scope, Preferences): Continue to treat but no CPR or intubation  Psycho-social/Spiritual:  Desire for further Chaplaincy support:no Additional Recommendations: Caregiving  Support/Resources  Prognosis:  Unable to determine, based on outcomes.  6 to 12 months or less would not be surprising based on chronic illness burden, what appears to be memory loss.  Discharge Planning: To be determined, would prefer home with home health over short-term rehab due to some sundowning.      Primary Diagnoses: Present on Admission:  Acute on chronic diastolic CHF (congestive heart failure) (HCC)  Microcytic anemia  Coronary artery disease involving native coronary artery of native heart without angina pectoris  PAD (peripheral artery disease) (HCC)  Essential hypertension   I have reviewed the medical record, interviewed the patient and family, and examined the patient. The following aspects are pertinent.  Past Medical History:  Diagnosis Date   Anemia    a. mild, noted 04/2017.   CAD in native artery    a. [place] 04/2017 s/p DES to D1, DES to prox-mid LAD, DES to prox LAD overlapping the prior stent, LVEF 55-65%.    Chronic diastolic CHF (congestive heart failure) (HCC)    Constipation    COPD (chronic obstructive pulmonary disease) (HCC)    Diabetic ulcer of toe (HCC)    DJD  (degenerative joint disease) of cervical spine    Dysrhythmia    AFib   Essential hypertension    GERD (gastroesophageal reflux disease)    History of hiatal hernia    HIT (heparin-induced thrombocytopenia) (HCC)    Hypothyroidism    Hypoxia    a. went home on home O2 04/2017.   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   Retinitis    Sinus bradycardia    a. HR 30s-40s in 04/2017 -> diltiazem stopped, metoprolol reduced.   Sleep apnea    "chose not to order CPAP at this time" (05/18/2017)   Type 2 diabetes mellitus (HCC)    Wears glasses    Wheezing    a. suspected COPD 04/2017. Former  tobacco x 40 years.   Social History   Socioeconomic History   Marital status: Married    Spouse name: patsy   Number of children: Not on file   Years of education: Not on file   Highest education level: Not on file  Occupational History   Not on file  Tobacco Use   Smoking status: Former    Packs/day: 2.00    Years: 30.00    Additional pack years: 0.00    Total pack years: 60.00    Types: Cigarettes    Start date: 03/28/1953    Quit date: 03/29/1983    Years since quitting: 39.3   Smokeless tobacco: Never  Vaping Use   Vaping Use: Never used  Substance and Sexual Activity   Alcohol use: Not Currently    Comment: 05/18/2017 'I drink a beer q yr"   Drug use: No   Sexual activity: Not Currently  Other Topics Concern   Not on file  Social History Narrative   Patient lives at home with his spouse.    Social Determinants of Health   Financial Resource Strain: Low Risk  (05/23/2022)   Overall Financial Resource Strain (CARDIA)    Difficulty of Paying Living Expenses: Not hard at all  Food Insecurity: No Food Insecurity (07/18/2022)   Hunger Vital Sign    Worried About Running Out of Food in the Last  Year: Never true    Ran Out of Food in the Last Year: Never true  Transportation Needs: No Transportation Needs (07/18/2022)   PRAPARE - Administrator, Civil Service (Medical): No    Lack of Transportation (Non-Medical): No  Physical Activity: Inactive (07/18/2022)   Exercise Vital Sign    Days of Exercise per Week: 0 days    Minutes of Exercise per Session: 0 min  Stress: No Stress Concern Present (07/29/2019)   [employer] of Occupational Health - Occupational Stress Questionnaire    Feeling of Stress : Not at all  Social Connections: Not on file   Family History  Problem Relation Age of Onset   Heart attack Father 40   COPD Father    COPD Mother    Heart disease Mother    Diabetes Mother    Hypertension Sister    CVA Sister    Diabetes Sister    Multiple sclerosis Sister    Scheduled Meds:  amLODipine  2.5 mg Oral Daily   apixaban  2.5 mg Oral BID   atorvastatin  80 mg Oral QPM   clopidogrel  75 mg Oral Q breakfast   furosemide  80 mg Intravenous BID   insulin aspart  0-15 Units Subcutaneous TID WC   insulin aspart  0-5 Units Subcutaneous QHS   ipratropium  0.5 mg Nebulization TID   isosorbide mononitrate  60 mg Oral BID   levalbuterol  1.25 mg Nebulization TID   pantoprazole  40 mg Oral Daily   QUEtiapine  50 mg Oral QHS   Continuous Infusions:  potassium chloride     PRN Meds:.acetaminophen **OR** acetaminophen, ALPRAZolam, haloperidol lactate, ondansetron **OR** ondansetron (ZOFRAN) IV Medications Prior to Admission:  Prior to Admission medications   Medication Sig Start Date End Date Taking? Authorizing Provider  acetaminophen (TYLENOL) 500 MG tablet Take 1 tablet (500 mg total) by mouth every 6 (six) hours as needed for mild pain or headache. Patient taking differently: Take 1,000 mg by mouth at bedtime. 03/03/19  Yes Drema Dallas, MD  amLODipine (NORVASC) 2.5 MG  tablet Take 2.5 mg by mouth daily.   Yes [provider]  atorvastatin  (LIPITOR) 80 MG tablet Take 80 mg by mouth every evening.  05/23/19  Yes [provider]  Cholecalciferol (VITAMIN D3) 50 MCG (2000 UT) TABS Take 2,000 Units by mouth 2 (two) times daily.   Yes [provider]  Cyanocobalamin (B-12) 2500 MCG TABS Take 2,500 mcg by mouth daily.   Yes [provider]  folic acid (FOLVITE) 1 MG tablet Take 1 tablet (1 mg total) by mouth daily. 12/06/17  Yes Meredeth Ide, MD  insulin glargine (LANTUS) 100 UNIT/ML injection Inject 30 Units into the skin 2 (two) times daily as needed.   Yes [provider]  ipratropium-albuterol (DUONEB) 0.5-2.5 (3) MG/3ML SOLN Take 3 mLs by nebulization every other day. 11/08/19  Yes [provider]  isosorbide mononitrate (IMDUR) 60 MG 24 hr tablet TAKE 1 TABLET TWICE DAILY (DOSE INCREASE 10/26/21) Patient taking differently: Take 60 mg by mouth daily. 06/15/22  Yes Branch, Dorothe Pea, MD  nitroGLYCERIN (NITROSTAT) 0.4 MG SL tablet PLACE 1 TABLET (0.4 MG TOTAL) UNDER THE TONGUE EVERY 5  MINUTES AS NEEDED FOR CHEST PAIN. Patient taking differently: Place 0.4 mg under the tongue every 5 (five) minutes x 3 doses as needed for chest pain. 05/22/17  Yes BranchDorothe Pea, MD  OXYGEN Inhale 2 L/min into the lungs See admin instructions. 2 L/min of oxygen at bedtime and during the day as needed for shortness of breath   Yes [provider]  pantoprazole (PROTONIX) 40 MG tablet Take 1 tablet (40 mg total) by mouth 2 (two) times daily. To protect stomach while taking multiple blood thinners. Patient taking differently: Take 40 mg by mouth daily. To protect stomach while taking multiple blood thinners. 11/29/18  Yes Vassie Loll, MD  potassium chloride SA (K-DUR) 20 MEQ tablet Take 1 tablet (20 mEq total) by mouth daily. 11/30/18  Yes Vassie Loll, MD  SPIRIVA HANDIHALER 18 MCG inhalation capsule Place 1 capsule into inhaler and inhale daily after breakfast.  01/23/20  Yes [provider]   traZODone (DESYREL) 100 MG tablet Take 200 mg by mouth at bedtime. 03/25/16  Yes [provider]  Accu-Chek Softclix Lancets lancets  02/19/20   [provider]  Alcohol Swabs (B-D SINGLE USE SWABS REGULAR) PADS  02/17/20   [provider]  apixaban (ELIQUIS) 2.5 MG TABS tablet Take 1 tablet (2.5 mg total) by mouth 2 (two) times daily. Patient taking differently: Take 5 mg by mouth 2 (two) times daily. 12/19/20   Baglia, Corrina, PA-C  Blood Glucose Monitoring Suppl (ACCU-CHEK GUIDE) w/Device KIT  02/19/20   [provider]  clopidogrel (PLAVIX) 75 MG tablet Take 1 tablet (75 mg total) by mouth daily with breakfast. 12/19/20   Baglia, Corrina, PA-C  DROPLET INSULIN SYRINGE 31G X 5/16" 1 ML MISC  09/24/19   [provider]  furosemide (LASIX) 40 MG tablet Take 1 tablet (40 mg total) by mouth 2 (two) times daily. 07/23/22 08/22/22  ShahmehdiGemma Payor, MD  traZODone (DESYREL) 50 MG tablet Take 100 mg by mouth at bedtime as needed. Patient not taking: Reported on 07/19/2022 06/24/22   [provider]  TRUE METRIX BLOOD GLUCOSE TEST test strip  07/12/19   [provider]   Allergies  Allergen Reactions   Codeine Shortness Of Breath   Doxycycline Swelling    Swelling and numbness in lips and face. Swelling improved after stopping. Reports still  experiences numbness in bottom lip.    Feraheme [Ferumoxytol] Other (See Comments)    Diaphoretic, chest pain   Heparin Other (See Comments)    +HIT,  Severe bleeding (with heparin drip & large doses), tolerates low doses   Iron Shortness Of Breath    Patient severe reaction to IV iron and does not tolerate oral formulations either   Losartan Swelling   Oxycodone Other (See Comments)    "Made me act out of my mind" Mental status changes- hallucinations   Latex Rash   Review of Systems  Unable to perform ROS: Mental status change    Physical Exam Vitals and nursing note reviewed.   Constitutional:      General: He is not in acute distress.    Appearance: He is ill-appearing.  HENT:     Mouth/Throat:     Mouth: Mucous membranes are dry.  Cardiovascular:     Rate and Rhythm: Normal rate.  Pulmonary:     Effort: Pulmonary effort is normal. No respiratory distress.  Musculoskeletal:     Comments: Bilateral toes removed  Skin:    General: Skin is warm and dry.     Vital Signs: BP 127/79   Pulse 88   Temp 98.5 F (36.9 C)   Resp 20   Ht 5\' 7"  (1.702 m)   Wt 77.9 kg   SpO2 100%   BMI 26.90 kg/m  Pain Scale: 0-10   Pain Score: Asleep   SpO2: SpO2: 100 % O2 Device:SpO2: 100 % O2 Flow Rate: .O2 Flow Rate (L/min): 5 L/min  IO: Intake/output summary:  Intake/Output Summary (Last 24 hours) at 07/25/2022 1201 Last data filed at 07/25/2022 0900 Gross per 24 hour  Intake --  Output 1400 ml  Net -1400 ml    LBM: Last BM Date : 07/22/22 Baseline Weight: Weight: 92.3 kg Most recent weight: Weight: 77.9 kg     Palliative Assessment/Data:     Time In: 1030 Time Out: 1145 Time Total: 75 minutes  Greater than 50%  of this time was spent counseling and coordinating care related to the above assessment and plan.  Signed by: Katheran Awe, NP   Please contact Palliative Medicine Team phone at [phone] for questions and concerns.  For individual provider: See Loretha Stapler

## 2022-07-25 NOTE — Progress Notes (Signed)
Ordered Speech evaluation d/t swallowing. Patient woke slowly and did not seem to know what to do with the straw; had to be prompted to close mouth around straw, and prompted to use it. After sipping water, he coughed a weak cough.

## 2022-07-25 NOTE — TOC Progression Note (Signed)
Transition of Care [facility]) - Progression Note    Patient Details  Name: Mike Wright MRN: [id number] Date of Birth: 1933/12/14  [facility]) CM/SW Contact  Leitha Bleak, RN Phone Number: 07/25/2022, 5:24 PM  Clinical Narrative:   Palliative consulted for home health. Patient is active with [facility] Orders are in and [place] updated.     Expected Discharge Plan: Home w Home Health Services Barriers to Discharge: Continued Medical Work up  Expected Discharge Plan and Services      Living arrangements for the past 2 months: Single Family Home                     HH Arranged: PT HH Agency: [facility]     Social Determinants of Health (SDOH) Interventions SDOH Screenings   Food Insecurity: No Food Insecurity (07/18/2022)  Housing: Low Risk  (07/18/2022)  Transportation Needs: No Transportation Needs (07/18/2022)  Utilities: Not At Risk (07/18/2022)  Depression (PHQ2-9): Low Risk  (07/19/2021)  Financial Resource Strain: Low Risk  (05/23/2022)  Physical Activity: Inactive (07/18/2022)  Stress: No Stress Concern Present (07/29/2019)  Tobacco Use: Medium Risk (07/25/2022)    Readmission Risk Interventions    11/15/2021   10:56 AM 12/18/2020    2:00 PM 02/07/2020   12:11 PM  Readmission Risk Prevention Plan  Transportation Screening Complete Complete Complete  PCP or Specialist Appt within 5-7 Days  Complete   PCP or Specialist Appt within 3-5 Days Complete  Complete  Home Care Screening  Complete   Medication Review (RN CM)  Complete   HRI or Home Care Consult Complete  Complete  Social Work Consult for Recovery Care Planning/Counseling Complete  Complete  Palliative Care Screening Complete  Not Applicable  Medication Review Oceanographer) Complete  Complete

## 2022-07-25 NOTE — Progress Notes (Signed)
Progress note   Patient: Mike Wright MRN: [id number] DOB: [birth year]  Admit date:     07/18/2022  Discharge date:   Discharge Physician: Kendell Bane   PCP: [name], MD    Subjective: Mike Wright seen and examined this morning, somnolent, status post medication given for sundowning, agitation overnight  Decline physically and mentally Discussed with wife at bedside     Recommendations at discharge:  Monitor daily weight, any increase in weight 3-5 pounds, lower extremity swelling, shortness of breath will notify your heart failure team cardiologist Cardiologist within 1 week Follow with the PCP in 1-2 weeks  Discharge Diagnoses: Principal Problem:   Acute on chronic diastolic CHF (congestive heart failure) (HCC) Active Problems:   Essential hypertension   Type 2 diabetes mellitus (HCC)   Microcytic anemia   Coronary artery disease involving native coronary artery of native heart without angina pectoris   PAD (peripheral artery disease) (HCC)   Atrial fibrillation, chronic (HCC)   Mixed hyperlipidemia  B rief Narrative:   Mike Wright is a 89 y.o. male with medical history significant of PAD, CAD, atrial fibrillation on Eliquis, diabetes mellitus who presents to the emergency department from cardiologist office.  Patient complained of 1 week onset of leg swelling which has extended to distention of the abdomen.  He was admitted for acute on chronic diastolic CHF exacerbation and started on IV Lasix for diuresis with cardiology consultation pending.     Assessment and Plan:     Acute on chronic diastolic CHF with acute on chronic hypoxemia -Continue to have shortness of breath, O2 demand has increased to 5 L, satting 96% -At home requires 2 L     -Continue total input/output, daily weights and fluid restriction -Been on IV Lasix 60, has been increased to 80 mg twice a day just Dr. Wyline Wright today   - -EKG personally reviewed showed atrial fibrillation  with rate controlled -Echocardiogram done on 11/11/2021 showed LVEF of 70 to 75%.  No RWMA.   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, continue to be volume overloaded Need IV diuretics--continue Lasix at 60 mg IV twice daily   -ReDS Clip right at 36 (value range 36-40) - BNP 432 >> 377.0 >> 200 Will receive an extra dose post 1 unit of blood transfusion of 40 mg of Lasix  Weight change: -3.798 kg      Filed Weights    07/20/22 0407 07/21/22 0333 07/22/22 0500  Weight: 87.2 kg 83.6 kg 79.8 kg       Intake/Output Summary (Last 24 hours) at 07/25/2022 1301 Last data filed at 07/25/2022 0900 Gross per 24 hour  Intake --  Output 1400 ml  Net -1400 ml        Moderate to severe aortic valve stenosis: Echocardiogram from 07/19/2022 showed aortic valve V-max of 4.03 m/s consistent with severe aortic valve stenosis  Medication optimization by cardiology, follow-up with structural heart clinic - has appointment with Dr. Clifton James on 08/01/2022.      Chronic atrial fibrillation with rate control Continue Eliquis   Microcytic anemia MCV 72.9, iron studies show deficiency, but patient is allergic to IV iron and therefore will be started on oral ferrous sulfate -Globin stabilized     Latest Ref Rng & Units 07/25/2022    4:27 AM 07/24/2022    4:33 AM 07/23/2022    4:47 AM  CBC  WBC 4.0 - 10.5 K/uL 6.2  9.2  7.5   Hemoglobin 13.0 - 17.0 g/dL 9.7  9.7  9.3   Hematocrit 39.0 - 52.0 % 32.2  33.1  31.7   Platelets 150 - 400 K/uL 103  103  102       07/20/2022- S/p  2U PRBC transfusion >>> hemoglobin 7.0 >>>> 9.4     CAD/PAD He Presented with ischemia with close in June 2019 which required intervention of lower extremity on the outside.  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 Continue Lipitor, Eliquis, Plavix, atorvastatin 80 mg, Imdur 60 mg twice daily - PCI in 2019   Hypokalemia  -Potassium still running low,  repleting orally and checking magnesium     mixed hyperlipidemia High intensity statin, atorvastatin 80 mg daily   Essential hypertension Continue amlodipine, Imdur   Type 2 diabetes mellitus Continue ISS and hypoglycemia protocol   Obesity BMI 30.52   Per patient/wife adverse reaction, allergy to iron: IV or p.o. iron supplements Medical records updated   Sundowning, encephalopathy  Due to medications, and physical decline Titrating medication, as needed Haldol  Ethics: Patient is declining physically and mentally, his current condition comorbidities progressive decline was discussed with the patient, his wife and his niece at bedside  Prognosis remain poor Patient remains full code   DVT prophylaxis:Eliquis Code Status: Full Family Communication: Has not at bedside-updated   Consultants: Cardiologist  Disposition: Home health Diet recommendation:  Discharge Diet Orders (From admission, onward)     Start     Ordered   07/23/22 0000  Diet - low sodium heart healthy        07/23/22 1123           Cardiac diet DISCHARGE MEDICATION:   Follow-up Information     [facility], [facility] up.   Specialty: Home Health Services Why: PT will call to schedule your next visit. Contact information: [address] [place] [address] [phone]                Discharge Exam: Filed Weights   07/23/22 0417 07/24/22 0324 07/25/22 0500  Weight: 78.3 kg 78.9 kg 77.9 kg          General:  Somnolent today, difficult to arouse, on 5 L of oxygen  HEENT:  Somnolent PERRL, otherwise with in Normal limits   Neuro:  Limited exam, somnolent this a.m. CNII-XII intact. , normal motor and sensation, reflexes intact   Lungs:   Clear to auscultation BL, Respirations unlabored,  No wheezes / crackles  Cardio:    JVD, holistic harsh murmur, S1/S2, RRR, No Rubs or Gallops   Abdomen:  Soft, non-tender, bowel sounds active all four quadrants, no  guarding or peritoneal signs.  Muscular  skeletal:  Limited exam -somnolent, global generalized weaknesses - in bed, able to move all 4 extremities,   2+ pulses,  symmetric, No pitting edema  Skin:  Dry, warm to touch, negative for any Rashes,  Wounds: Please see nursing documentation             Condition at discharge: fair  The results of significant diagnostics from this hospitalization (including imaging, microbiology, ancillary and laboratory) are listed below for reference.   Imaging Studies: DG CHEST PORT 1 VIEW  Result Date: 07/24/2022 CLINICAL DATA:  Shortness of breath. EXAM: PORTABLE CHEST 1 VIEW COMPARISON:  Radiograph 07/22/2022 FINDINGS: Persistent low lung volumes. The heart is enlarged, stable. Unchanged mediastinal contours. Diffuse interstitial opacities, not significantly changed from prior exam. There  is a small right pleural effusion. Improving right upper lobe opacity. No pneumothorax. IMPRESSION: 1. Unchanged cardiomegaly and diffuse interstitial opacities, suspicious for pulmonary edema. 2. Small right pleural effusion. 3. Improving right upper lobe opacity. Electronically Signed   By: Narda Rutherford M.D.   On: 07/24/2022 12:24   DG CHEST PORT 1 VIEW  Result Date: 07/22/2022 CLINICAL DATA:  Shortness of breath EXAM: PORTABLE CHEST 1 VIEW COMPARISON:  CXR 07/18/22 FINDINGS: Low lung volumes. No pleural effusion. No pneumothorax. Cardiomegaly. There are prominent bilateral interstitial opacities, which could represent pulmonary venous congestion or mild pulmonary edema. There appear to be a more focal opacities in the right upper and lower lobes, which could represent infection. No radiographically apparent displaced rib fractures. Visualized upper abdomen is unremarkable. IMPRESSION: 1. Cardiomegaly with prominent bilateral interstitial opacities, which could represent pulmonary venous congestion or mild pulmonary edema. 2. There appear to be more focal opacities in  the right upper and lower lobes, which could represent infection. Electronically Signed   By: Lorenza Cambridge M.D.   On: 07/22/2022 08:23   ECHOCARDIOGRAM COMPLETE  Result Date: 07/19/2022    ECHOCARDIOGRAM REPORT   Patient Name:   Mike Wright Date of Exam: 07/19/2022 Medical Rec #:  [id number]       Height:       67.0 in Accession #:    [id number]      Weight:       194.9 lb Date of Birth:  16-Dec-1933       BSA:          2.000 m Patient Age:    88 years        BP:           136/56 mmHg Patient Gender: M               HR:           69 bpm. [facility] Procedure: 2D Echo, Cardiac Doppler and Color Doppler Indications:    CHF-Acute Diastolic I50.31  History:        Patient has prior history of Echocardiogram examinations, most                 recent 11/11/2021. CHF, CAD, Arrythmias:Atrial Fibrillation,                 Bradycardia and Atrial Flutter; Risk Factors:Hypertension,                 Diabetes and Dyslipidemia. Sleep apnea,                 PAD (peripheral artery disease).  Sonographer:    Celesta Gentile RCS Referring Phys: [id number] OLADAPO ADEFESO IMPRESSIONS  1. Left ventricular ejection fraction, by estimation, is 70 to 75%. The left ventricle has hyperdynamic function. The left ventricle has no regional wall motion abnormalities. There is mild left ventricular hypertrophy. Left ventricular diastolic function could not be evaluated due to atrial flutter.  2. Right ventricular systolic function is normal. The right ventricular size is normal. There is severely elevated pulmonary artery systolic pressure. The estimated right ventricular systolic pressure is 73.1 mmHg.  3. Left atrial size was severely dilated.  4. Right atrial size was severely dilated.  5. The mitral valve is abnormal. No evidence of mitral valve regurgitation. No evidence of mitral stenosis. Severe mitral annular calcification.  6. The tricuspid valve is abnormal. Tricuspid valve regurgitation is mild to moderate.  7. The  aortic valve  is calcified. Aortic valve regurgitation is mild. Moderate to severe aortic valve stenosis. Aortic valve area, by VTI measures 1.13 cm. Aortic valve mean gradient measures 33.0 mmHg. Aortic valve Vmax measures 4.03 m/s. AV DVI 0.33.  8. The inferior vena cava is dilated in size with <50% respiratory variability, suggesting right atrial pressure of 15 mmHg.  9. Increased flow velocities may be secondary to anemia, thyrotoxicosis, hyperdynamic or high flow state. Comparison(s): Changes from prior study are noted. AS worsened from moderate (DVI 0.33, Vmax 3.46 m/sec, mean PG 25 mm Hg) in 10/2021 to moderate-severe (DVI 0.33, Vmax 4.03 m/sec, mean PG 33 mm Hg) now. FINDINGS  Left Ventricle: Left ventricular ejection fraction, by estimation, is 70 to 75%. The left ventricle has hyperdynamic function. The left ventricle has no regional wall motion abnormalities. The left ventricular internal cavity size was normal in size. There is mild left ventricular hypertrophy. Left ventricular diastolic function could not be evaluated due to atrial fibrillation. Left ventricular diastolic function could not be evaluated. Right Ventricle: The right ventricular size is normal. No increase in right ventricular wall thickness. Right ventricular systolic function is normal. There is severely elevated pulmonary artery systolic pressure. The tricuspid regurgitant velocity is 3.81 m/s, and with an assumed right atrial pressure of 15 mmHg, the estimated right ventricular systolic pressure is 73.1 mmHg. Left Atrium: Left atrial size was severely dilated. Right Atrium: Right atrial size was severely dilated. Pericardium: There is no evidence of pericardial effusion. Mitral Valve: The mitral valve is abnormal. Severe mitral annular calcification. No evidence of mitral valve regurgitation. No evidence of mitral valve stenosis. Tricuspid Valve: The tricuspid valve is abnormal. Tricuspid valve regurgitation is mild to moderate. No  evidence of tricuspid stenosis. Aortic Valve: The aortic valve is calcified. Aortic valve regurgitation is mild. Aortic regurgitation PHT measures 469 msec. Moderate to severe aortic stenosis is present. Aortic valve mean gradient measures 33.0 mmHg. Aortic valve peak gradient measures  64.8 mmHg. Aortic valve area, by VTI measures 1.13 cm. Pulmonic Valve: The pulmonic valve was normal in structure. Pulmonic valve regurgitation is trivial. No evidence of pulmonic stenosis. Aorta: The aortic root is normal in size and structure. Venous: The inferior vena cava is dilated in size with less than 50% respiratory variability, suggesting right atrial pressure of 15 mmHg. IAS/Shunts: No atrial level shunt detected by color flow Doppler.  LEFT VENTRICLE PLAX 2D LVIDd:         5.10 cm   Diastology LVIDs:         2.50 cm   LV e' medial:    6.74 cm/s LV PW:         1.30 cm   LV E/e' medial:  21.8 LV IVS:        1.10 cm   LV e' lateral:   8.38 cm/s LVOT diam:     2.00 cm   LV E/e' lateral: 17.5 LV SV:         101 LV SV Index:   51 LVOT Area:     3.14 cm  RIGHT VENTRICLE RV S prime:     11.90 cm/s TAPSE (M-mode): 2.0 cm LEFT ATRIUM              Index        RIGHT ATRIUM           Index LA diam:        4.90 cm  2.45 cm/m   RA Area:     32.20 cm  LA Vol (A2C):   112.0 ml 56.00 ml/m  RA Volume:   120.00 ml 60.00 ml/m LA Vol (A4C):   115.0 ml 57.50 ml/m LA Biplane Vol: 121.0 ml 60.50 ml/m  AORTIC VALVE AV Area (Vmax):    1.05 cm AV Area (Vmean):   1.05 cm AV Area (VTI):     1.13 cm AV Vmax:           402.50 cm/s AV Vmean:          260.000 cm/s AV VTI:            0.900 m AV Peak Grad:      64.8 mmHg AV Mean Grad:      33.0 mmHg LVOT Vmax:         134.00 cm/s LVOT Vmean:        86.700 cm/s LVOT VTI:          0.323 m LVOT/AV VTI ratio: 0.36 AI PHT:            469 msec  AORTA Ao Root diam: 3.20 cm MITRAL VALVE                TRICUSPID VALVE MV Area (PHT): 4.06 cm     TR Peak grad:   58.1 mmHg MV Decel Time: 187 msec     TR  Vmax:        381.00 cm/s MV E velocity: 147.00 cm/s MV A velocity: 67.00 cm/s   SHUNTS MV E/A ratio:  2.19         Systemic VTI:  0.32 m                             Systemic Diam: 2.00 cm Vishnu Priya Mallipeddi Electronically signed by Winfield Rast Mallipeddi Signature Date/Time: 07/19/2022/2:48:15 PM    Final    DG Chest 2 View  Result Date: 07/18/2022 CLINICAL DATA:  swelling EXAM: CHEST - 2 VIEW COMPARISON:  Chest x-ray July 26, 2019. FINDINGS: Mild diffuse interstitial opacities. Small right pleural effusion. No visible pneumothorax. Enlarged cardiac silhouette. IMPRESSION: 1. Probable mild interstitial edema.  Small right pleural effusion. 2. Cardiomegaly. Electronically Signed   By: Feliberto Harts M.D.   On: 07/18/2022 15:58    Microbiology: Results for orders placed or performed during the hospital encounter of 11/10/21  MRSA Next Gen by PCR, Nasal     Status: None   Collection Time: 11/12/21  9:08 AM   Specimen: Nasal Mucosa; Nasal Swab  Result Value Ref Range Status   MRSA by PCR Next Gen NOT DETECTED NOT DETECTED Final    Comment: (NOTE) The GeneXpert MRSA Assay (FDA approved for NASAL specimens only), is one component of a comprehensive MRSA colonization surveillance program. It is not intended to diagnose MRSA infection nor to guide or monitor treatment for MRSA infections. Test performance is not FDA approved in patients less than 28 years old. Performed at [facility], [address] [place], [place] [address]     Labs: CBC: Recent Labs  Lab 07/21/22 [phone] 07/22/22 0435 07/23/22 0447 07/24/22 0433 07/25/22 0427  WBC 7.1 7.3 7.5 9.2 6.2  HGB 9.4* 9.4* 9.3* 9.7* 9.7*  HCT 31.7* 31.9* 31.7* 33.1* 32.2*  MCV 74.8* 74.7* 75.8* 76.4* 75.6*  PLT 99* 102* 102* 103* 103*   Basic Metabolic Panel: Recent Labs  Lab 07/18/22 1554 07/19/22 0427 07/20/22 0427 07/21/22 4098 07/22/22 0435 07/23/22 0447 07/24/22 1191 07/25/22 4782  NA 138 139 138 140 139 138  140 139  K 4.1 3.4* 3.8 3.7 3.0* 3.1* 3.7 3.0*  CL 104 103 102 100 97* 98 97* 98  CO2 26 27 27 31 30 30  33* 30  GLUCOSE 108* 103* 103* 114* 103* 124* 146* 142*  BUN 22 20 20 22 23  27* 31* 32*  CREATININE 1.50* 1.41* 1.45* 1.61* 1.37* 1.38* 1.41* 1.43*  CALCIUM 8.3* 8.3* 8.2* 8.5* 8.4* 8.4* 8.6* 8.3*  MG 2.2 2.1 2.1  --   --  2.0  --   --   PHOS  --  4.4  --   --   --   --   --   --    Liver Function Tests: Recent Labs  Lab 07/19/22 0427  AST 14*  ALT 10  ALKPHOS 72  BILITOT 1.3*  PROT 6.0*  ALBUMIN 3.6   CBG: Recent Labs  Lab 07/24/22 1051 07/24/22 1628 07/24/22 2048 07/25/22 0720 07/25/22 1128  GLUCAP 136* 138* 147* 133* 133*    Discharge time spent: greater than 35  minutes.  Signed: Kendell Bane, MD [facility] 07/25/2022

## 2022-07-25 NOTE — Progress Notes (Signed)
   07/25/22 1035  Spiritual Encounters  Type of Visit Initial  Care provided to: [facility] partners present during encounter  Psychiatrist)  Reason for visit End-of-life  OnCall Visit No   Chaplain attempted to meet with patient, Mike Wright as recommended during progressive meeting this morning. [name] was asleep and his sitter stated that he had been asleep since she started at 0700. I visited briefly with family members Mike Wright and Mike Wright. They shared that all things considered they are doing ok and declined to chaplain visit at this time. I advised them if they do wish to speak with a chaplain at any point to let their nurse know and and someone will respond.   Mike Wright [phone]

## 2022-07-25 NOTE — Progress Notes (Addendum)
Patient very sleepy this am, had to rub his chest this am to get him to respond, patient stated" what is it" I said Good morning ! He responded with a good morning , I informed him that I was his nurse ,and that I had some medications for him, patient had went back to sleep. Medications held at this time until patient is fully awake,Dr Sherryll Burger notified.Family at bedside. Plan of care on going.

## 2022-07-25 NOTE — Plan of Care (Signed)
Patient more alert this afternoon trying to get up out the bed ,c/o leg and neck pain. Safety sitter in room . Patient given Tylenol  650 mg by mouth per MAR PRN orders.Plan of care on going.

## 2022-07-26 DIAGNOSIS — I5033 Acute on chronic diastolic (congestive) heart failure: Secondary | ICD-10-CM | POA: Diagnosis not present

## 2022-07-26 DIAGNOSIS — I5023 Acute on chronic systolic (congestive) heart failure: Secondary | ICD-10-CM

## 2022-07-26 LAB — GLUCOSE, CAPILLARY
Glucose-Capillary: 166 mg/dL — ABNORMAL HIGH (ref 70–99)
Glucose-Capillary: 165 mg/dL — ABNORMAL HIGH (ref 70–99)
Glucose-Capillary: 136 mg/dL — ABNORMAL HIGH (ref 70–99)
Glucose-Capillary: 192 mg/dL — ABNORMAL HIGH (ref 70–99)
Glucose-Capillary: 178 mg/dL — ABNORMAL HIGH (ref 70–99)

## 2022-07-26 LAB — COMPREHENSIVE METABOLIC PANEL
ALT: 11 U/L (ref 0–44)
AST: 19 U/L (ref 15–41)
Albumin: 3.3 g/dL — ABNORMAL LOW (ref 3.5–5.0)
Alkaline Phosphatase: 75 U/L (ref 38–126)
Anion gap: 10 (ref 5–15)
BUN: 34 mg/dL — ABNORMAL HIGH (ref 8–23)
CO2: 29 mmol/L (ref 22–32)
Calcium: 8.2 mg/dL — ABNORMAL LOW (ref 8.9–10.3)
Chloride: 97 mmol/L — ABNORMAL LOW (ref 98–111)
Creatinine, Ser: 1.37 mg/dL — ABNORMAL HIGH (ref 0.61–1.24)
GFR, Estimated: 50 mL/min — ABNORMAL LOW (ref >60)
Glucose, Bld: 183 mg/dL — ABNORMAL HIGH (ref 70–99)
Potassium: 3.3 mmol/L — ABNORMAL LOW (ref 3.5–5.1)
Sodium: 136 mmol/L (ref 135–145)
Total Bilirubin: 1.4 mg/dL — ABNORMAL HIGH (ref 0.3–1.2)
Total Protein: 6.1 g/dL — ABNORMAL LOW (ref 6.5–8.1)

## 2022-07-26 LAB — URINALYSIS, COMPLETE (UACMP) WITH MICROSCOPIC
Bilirubin Urine: NEGATIVE
Glucose, UA: NEGATIVE mg/dL
Hgb urine dipstick: NEGATIVE
Ketones, ur: NEGATIVE mg/dL
Leukocytes,Ua: NEGATIVE
Nitrite: NEGATIVE
Protein, ur: NEGATIVE mg/dL
Specific Gravity, Urine: 1.01 (ref 1.005–1.030)
pH: 5 (ref 5.0–8.0)

## 2022-07-26 LAB — BRAIN NATRIURETIC PEPTIDE: B Natriuretic Peptide: 159 pg/mL — ABNORMAL HIGH (ref 0.0–100.0)

## 2022-07-26 MED ORDER — POTASSIUM CHLORIDE CRYS ER 20 MEQ PO TBCR
40.0000 meq | EXTENDED_RELEASE_TABLET | Freq: Once | ORAL | Status: AC
  Administered 2022-07-26: 40 meq via ORAL
  Filled 2022-07-26: qty 2

## 2022-07-26 MED ORDER — AMPHETAMINE-DEXTROAMPHETAMINE 10 MG PO TABS: 10.0000 mg | ORAL_TABLET | Freq: Every day | ORAL | Status: DC

## 2022-07-26 MED ORDER — LEVALBUTEROL HCL 1.25 MG/0.5ML IN NEBU
1.2500 mg | INHALATION_SOLUTION | Freq: Two times a day (BID) | RESPIRATORY_TRACT | Status: DC
  Administered 2022-07-26 – 2022-07-27 (×2): 1.25 mg via RESPIRATORY_TRACT
  Filled 2022-07-26 (×2): qty 0.5

## 2022-07-26 MED ORDER — IPRATROPIUM BROMIDE 0.02 % IN SOLN
0.5000 mg | Freq: Two times a day (BID) | RESPIRATORY_TRACT | Status: DC
  Administered 2022-07-26 – 2022-07-27 (×2): 0.5 mg via RESPIRATORY_TRACT
  Filled 2022-07-26 (×2): qty 2.5

## 2022-07-26 MED ORDER — TORSEMIDE 10 MG PO TABS: 10.0000 mg | ORAL_TABLET | Freq: Two times a day (BID) | ORAL | 1 refills | Status: DC

## 2022-07-26 MED ORDER — FUROSEMIDE 10 MG/ML IJ SOLN
80.0000 mg | Freq: Two times a day (BID) | INTRAMUSCULAR | Status: AC
  Administered 2022-07-26 (×2): 80 mg via INTRAVENOUS
  Filled 2022-07-26 (×2): qty 8

## 2022-07-26 MED ORDER — POTASSIUM CHLORIDE CRYS ER 20 MEQ PO TBCR: 20.0000 meq | EXTENDED_RELEASE_TABLET | Freq: Two times a day (BID) | ORAL | 1 refills | Status: AC

## 2022-07-26 NOTE — Progress Notes (Signed)
Palliative:   Mike Wright is sitting up in the Haring chair in his room.  He appears chronically ill and somewhat frail.  He greets me, making and mostly keeping eye contact.  He is alert and oriented x 3, able to make his needs known.  His wife Mike Wright and his niece Mike Wright are present at bedside.  Overall, Mike Wright is able to tell me that he was hospitalized due to fluid overload and his heart.  We talk about the treatment plan.  He shares that he trusts his cardiologist, Dr. Wyline Mood.  We talk about the plan for discharge home tomorrow.  Mike Wright is [facility] for [facility].  We talk about doing his own exercises also.  We also talk about the benefits of outpatient palliative services.  At this point patient and family decline.  I share that they can ask for the services at any time through their PCP or Dr. Wyline Mood.  Conference with attending, bedside nursing staff, transition of care team related to patient condition, needs, goals of care, disposition.  Plan: Continue to treat the treatable but no CPR or intubation.  Home with the benefits of home health services through [facility], already in place.  [facility] outpatient palliative services at this time.  35 minutes  [name], NP  Palliative Medicine Team  Team Phone [phone] Greater than 50% of this time was spent counseling and coordinating care related to the above assessment and plan .

## 2022-07-26 NOTE — Progress Notes (Signed)
PT has been ambulated to the bathroom this shift and has been sat up in the chair most of this shift. Pt has been alert and oriented x4 and has not needed a sitter this shift. Wife has been bedside.

## 2022-07-26 NOTE — Discharge Summary (Signed)
Progress note   Patient: Mike Wright MRN: [id number] DOB: 04-28-1933  Admit date:     07/18/2022  Discharge date:   Discharge Physician: Kendell Bane   PCP: Mike Peri, MD    Subjective: The patient was seen and examined this morning much more awake alert, O2 demand has improved from 5 to 3 L Diuresing well, medication adjusted by cardiology Dr. Wyline Mood  Patient will be cleared for discharge HOME  in a.m. 07/27/2022  Recommendations at discharge:  Monitor daily weight, any increase in weight 3-5 pounds, lower extremity swelling, shortness of breath will notify your heart failure team cardiologist Cardiologist within 1 week Follow with the PCP in 1-2 weeks -Follow-up with outpatient palliative  Discharge Diagnoses: Principal Problem:   Acute on chronic diastolic CHF (congestive heart failure) (HCC) Active Problems:   Essential hypertension   Type 2 diabetes mellitus (HCC)   Microcytic anemia   Coronary artery disease involving native coronary artery of native heart without angina pectoris   PAD (peripheral artery disease) (HCC)   Atrial fibrillation, chronic (HCC)   Mixed hyperlipidemia  B rief Narrative:   Mike Wright is a 89 y.o. male with medical history significant of PAD, CAD, atrial fibrillation on Eliquis, diabetes mellitus who presents to the emergency department from cardiologist office.  Patient complained of 1 week onset of leg swelling which has extended to distention of the abdomen.  He was admitted for acute on chronic diastolic CHF exacerbation and started on IV Lasix for diuresis with cardiology consultation pending.     Assessment and Plan:     Acute on chronic diastolic CHF with acute on chronic hypoxemia -Diuresing well, O2 demand has improved from 5 to 3 L, satting 98% today -At home requires 2 L     -Continue total input/output, daily weights and fluid restriction -Been on IV Lasix 60, was increased to 80 mg twice a day just Dr. Wyline Mood  today -Planning to transition to torsemide 20 mg p.o. twice daily on 07/27/2022 With 20 mg of KCl twice daily  --------------------------------------------------------------------------------------------- -EKG personally reviewed showed atrial fibrillation with rate controlled -Echocardiogram done on 11/11/2021 showed LVEF of 70 to 75%.  No RWMA.   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, continue to be volume overloaded Need IV diuretics--continue Lasix at 60 mg IV twice daily   -ReDS Clip right at 36 (value range 36-40) - BNP 432 >> 377.0 >> 200 Will receive an extra dose post 1 unit of blood transfusion of 40 mg of Lasix Filed Weights   07/24/22 0324 07/25/22 0500 07/26/22 0503  Weight: 78.9 kg 77.9 kg 78 kg   I/O last 3 completed shifts: In: 692.6 [P.O.:360; IV Piggyback:332.6] Out: 2950 [Urine:2950] No intake/output data recorded.       Moderate to severe aortic valve stenosis: Echocardiogram from 07/19/2022 showed aortic valve V-max of 4.03 m/s consistent with severe aortic valve stenosis  Medication optimization by cardiology, follow-up with structural heart clinic - has appointment with Dr. Clifton James on 08/01/2022.      Chronic atrial fibrillation with rate control Continue Eliquis   Microcytic anemia MCV 72.9, iron studies show deficiency, but patient is allergic to IV iron and therefore will be started on oral ferrous sulfate 07/20/2022- S/p  2U PRBC transfusion >>> hemoglobin 7.0 >>>> 9.7 - stable      CAD/PAD He Presented with ischemia with close in June 2019 which required intervention of lower extremity on  the outside.  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 Continue Lipitor, Eliquis, Plavix, atorvastatin 80 mg, Imdur 60 mg twice daily - PCI in 2019   Hypokalemia  -Potassium still running low, repleting orally and checking magnesium -Planning for KCl 20 mill equivalent p.o. twice daily      mixed hyperlipidemia High intensity statin, atorvastatin 80 mg daily   Essential hypertension Continue amlodipine, Imdur   Type 2 diabetes mellitus Resume home regimen, diabetic diet   Obesity BMI 30.52   Per patient/wife adverse reaction, allergy to iron: IV or p.o. iron supplements Medical records updated   Sundowning, encephalopathy  Due to medications, and physical decline Titrating medication, as needed Haldol  Ethics: Patient is declining physically and mentally, his current condition comorbidities progressive decline was discussed with the patient, his wife and his niece at bedside  Prognosis remain poor Patient remains full code   DVT prophylaxis:Eliquis Code Status: DNR/DNI Family Communication: Wife present at bedside updated   Consultants: Cardiologist  Disposition: Home health Diet recommendation:  Discharge Diet Orders (From admission, onward)     Start     Ordered   07/23/22 0000  Diet - low sodium heart healthy        07/23/22 1123           Cardiac diet DISCHARGE MEDICATION:   Follow-up Information     Care, [facility] Follow up.   Specialty: Home Health Services Why: PT will call to schedule your next visit. Contact information: [address] [place] [address] [phone]                Discharge Exam: Filed Weights   07/24/22 0324 07/25/22 0500 07/26/22 0503  Weight: 78.9 kg 77.9 kg 78 kg          General:  Somnolent today, difficult to arouse, on 5 L of oxygen  HEENT:  Somnolent PERRL, otherwise with in Normal limits   Neuro:  Limited exam, somnolent this a.m. CNII-XII intact. , normal motor and sensation, reflexes intact   Lungs:   Clear to auscultation BL, Respirations unlabored,  No wheezes / crackles  Cardio:    JVD, holistic harsh murmur, S1/S2, RRR, No Rubs or Gallops   Abdomen:  Soft, non-tender, bowel sounds active all four quadrants, no guarding or peritoneal signs.  Muscular   skeletal:  Limited exam -somnolent, global generalized weaknesses - in bed, able to move all 4 extremities,   2+ pulses,  symmetric, No pitting edema  Skin:  Dry, warm to touch, negative for any Rashes,  Wounds: Please see nursing documentation             Condition at discharge: fair  The results of significant diagnostics from this hospitalization (including imaging, microbiology, ancillary and laboratory) are listed below for reference.   Imaging Studies: DG CHEST PORT 1 VIEW  Result Date: 07/24/2022 CLINICAL DATA:  Shortness of breath. EXAM: PORTABLE CHEST 1 VIEW COMPARISON:  Radiograph 07/22/2022 FINDINGS: Persistent low lung volumes. The heart is enlarged, stable. Unchanged mediastinal contours. Diffuse interstitial opacities, not significantly changed from prior exam. There is a small right pleural effusion. Improving right upper lobe opacity. No pneumothorax. IMPRESSION: 1. Unchanged cardiomegaly and diffuse interstitial opacities, suspicious for pulmonary edema. 2. Small right pleural effusion. 3. Improving right upper lobe opacity. Electronically Signed   By: Narda Rutherford M.D.   On: 07/24/2022 12:24   DG CHEST PORT 1 VIEW  Result Date: 07/22/2022 CLINICAL DATA:  Shortness of breath  EXAM: PORTABLE CHEST 1 VIEW COMPARISON:  CXR 07/18/22 FINDINGS: Low lung volumes. No pleural effusion. No pneumothorax. Cardiomegaly. There are prominent bilateral interstitial opacities, which could represent pulmonary venous congestion or mild pulmonary edema. There appear to be a more focal opacities in the right upper and lower lobes, which could represent infection. No radiographically apparent displaced rib fractures. Visualized upper abdomen is unremarkable. IMPRESSION: 1. Cardiomegaly with prominent bilateral interstitial opacities, which could represent pulmonary venous congestion or mild pulmonary edema. 2. There appear to be more focal opacities in the right upper and lower lobes, which  could represent infection. Electronically Signed   By: Lorenza Cambridge M.D.   On: 07/22/2022 08:23   ECHOCARDIOGRAM COMPLETE  Result Date: 07/19/2022    ECHOCARDIOGRAM REPORT   Patient Name:   HERNAN TURNAGE Date of Exam: 07/19/2022 Medical Rec #:  [id number]       Height:       67.0 in Accession #:    [id number]      Weight:       194.9 lb Date of Birth:  08/10/1933       BSA:          2.000 m Patient Age:    88 years        BP:           136/56 mmHg Patient Gender: M               HR:           69 bpm. [facility] Procedure: 2D Echo, Cardiac Doppler and Color Doppler Indications:    CHF-Acute Diastolic I50.31  History:        Patient has prior history of Echocardiogram examinations, most                 recent 11/11/2021. CHF, CAD, Arrythmias:Atrial Fibrillation,                 Bradycardia and Atrial Flutter; Risk Factors:Hypertension,                 Diabetes and Dyslipidemia. Sleep apnea,                 PAD (peripheral artery disease).  Sonographer:    Celesta Gentile RCS Referring Phys: [id number] OLADAPO ADEFESO IMPRESSIONS  1. Left ventricular ejection fraction, by estimation, is 70 to 75%. The left ventricle has hyperdynamic function. The left ventricle has no regional wall motion abnormalities. There is mild left ventricular hypertrophy. Left ventricular diastolic function could not be evaluated due to atrial flutter.  2. Right ventricular systolic function is normal. The right ventricular size is normal. There is severely elevated pulmonary artery systolic pressure. The estimated right ventricular systolic pressure is 73.1 mmHg.  3. Left atrial size was severely dilated.  4. Right atrial size was severely dilated.  5. The mitral valve is abnormal. No evidence of mitral valve regurgitation. No evidence of mitral stenosis. Severe mitral annular calcification.  6. The tricuspid valve is abnormal. Tricuspid valve regurgitation is mild to moderate.  7. The aortic valve is calcified. Aortic valve  regurgitation is mild. Moderate to severe aortic valve stenosis. Aortic valve area, by VTI measures 1.13 cm. Aortic valve mean gradient measures 33.0 mmHg. Aortic valve Vmax measures 4.03 m/s. AV DVI 0.33.  8. The inferior vena cava is dilated in size with <50% respiratory variability, suggesting right atrial pressure of 15 mmHg.  9. Increased flow velocities may be secondary to  anemia, thyrotoxicosis, hyperdynamic or high flow state. Comparison(s): Changes from prior study are noted. AS worsened from moderate (DVI 0.33, Vmax 3.46 m/sec, mean PG 25 mm Hg) in 10/2021 to moderate-severe (DVI 0.33, Vmax 4.03 m/sec, mean PG 33 mm Hg) now. FINDINGS  Left Ventricle: Left ventricular ejection fraction, by estimation, is 70 to 75%. The left ventricle has hyperdynamic function. The left ventricle has no regional wall motion abnormalities. The left ventricular internal cavity size was normal in size. There is mild left ventricular hypertrophy. Left ventricular diastolic function could not be evaluated due to atrial fibrillation. Left ventricular diastolic function could not be evaluated. Right Ventricle: The right ventricular size is normal. No increase in right ventricular wall thickness. Right ventricular systolic function is normal. There is severely elevated pulmonary artery systolic pressure. The tricuspid regurgitant velocity is 3.81 m/s, and with an assumed right atrial pressure of 15 mmHg, the estimated right ventricular systolic pressure is 73.1 mmHg. Left Atrium: Left atrial size was severely dilated. Right Atrium: Right atrial size was severely dilated. Pericardium: There is no evidence of pericardial effusion. Mitral Valve: The mitral valve is abnormal. Severe mitral annular calcification. No evidence of mitral valve regurgitation. No evidence of mitral valve stenosis. Tricuspid Valve: The tricuspid valve is abnormal. Tricuspid valve regurgitation is mild to moderate. No evidence of tricuspid stenosis. Aortic  Valve: The aortic valve is calcified. Aortic valve regurgitation is mild. Aortic regurgitation PHT measures 469 msec. Moderate to severe aortic stenosis is present. Aortic valve mean gradient measures 33.0 mmHg. Aortic valve peak gradient measures  64.8 mmHg. Aortic valve area, by VTI measures 1.13 cm. Pulmonic Valve: The pulmonic valve was normal in structure. Pulmonic valve regurgitation is trivial. No evidence of pulmonic stenosis. Aorta: The aortic root is normal in size and structure. Venous: The inferior vena cava is dilated in size with less than 50% respiratory variability, suggesting right atrial pressure of 15 mmHg. IAS/Shunts: No atrial level shunt detected by color flow Doppler.  LEFT VENTRICLE PLAX 2D LVIDd:         5.10 cm   Diastology LVIDs:         2.50 cm   LV e' medial:    6.74 cm/s LV PW:         1.30 cm   LV E/e' medial:  21.8 LV IVS:        1.10 cm   LV e' lateral:   8.38 cm/s LVOT diam:     2.00 cm   LV E/e' lateral: 17.5 LV SV:         101 LV SV Index:   51 LVOT Area:     3.14 cm  RIGHT VENTRICLE RV S prime:     11.90 cm/s TAPSE (M-mode): 2.0 cm LEFT ATRIUM              Index        RIGHT ATRIUM           Index LA diam:        4.90 cm  2.45 cm/m   RA Area:     32.20 cm LA Vol (A2C):   112.0 ml 56.00 ml/m  RA Volume:   120.00 ml 60.00 ml/m LA Vol (A4C):   115.0 ml 57.50 ml/m LA Biplane Vol: 121.0 ml 60.50 ml/m  AORTIC VALVE AV Area (Vmax):    1.05 cm AV Area (Vmean):   1.05 cm AV Area (VTI):     1.13 cm AV Vmax:  402.50 cm/s AV Vmean:          260.000 cm/s AV VTI:            0.900 m AV Peak Grad:      64.8 mmHg AV Mean Grad:      33.0 mmHg LVOT Vmax:         134.00 cm/s LVOT Vmean:        86.700 cm/s LVOT VTI:          0.323 m LVOT/AV VTI ratio: 0.36 AI PHT:            469 msec  AORTA Ao Root diam: 3.20 cm MITRAL VALVE                TRICUSPID VALVE MV Area (PHT): 4.06 cm     TR Peak grad:   58.1 mmHg MV Decel Time: 187 msec     TR Vmax:        381.00 cm/s MV E velocity:  147.00 cm/s MV A velocity: 67.00 cm/s   SHUNTS MV E/A ratio:  2.19         Systemic VTI:  0.32 m                             Systemic Diam: 2.00 cm Vishnu Priya Mallipeddi Electronically signed by Winfield Rast Mallipeddi Signature Date/Time: 07/19/2022/2:48:15 PM    Final    DG Chest 2 View  Result Date: 07/18/2022 CLINICAL DATA:  swelling EXAM: CHEST - 2 VIEW COMPARISON:  Chest x-ray July 26, 2019. FINDINGS: Mild diffuse interstitial opacities. Small right pleural effusion. No visible pneumothorax. Enlarged cardiac silhouette. IMPRESSION: 1. Probable mild interstitial edema.  Small right pleural effusion. 2. Cardiomegaly. Electronically Signed   By: Feliberto Harts M.D.   On: 07/18/2022 15:58    Microbiology: Results for orders placed or performed during the hospital encounter of 11/10/21  MRSA Next Gen by PCR, Nasal     Status: None   Collection Time: 11/12/21  9:08 AM   Specimen: Nasal Mucosa; Nasal Swab  Result Value Ref Range Status   MRSA by PCR Next Gen NOT DETECTED NOT DETECTED Final    Comment: (NOTE) The GeneXpert MRSA Assay (FDA approved for NASAL specimens only), is one component of a comprehensive MRSA colonization surveillance program. It is not intended to diagnose MRSA infection nor to guide or monitor treatment for MRSA infections. Test performance is not FDA approved in patients less than 21 years old. Performed at [facility], [address], [place], [place] [address]     Labs: CBC: Recent Labs  Lab 07/21/22 [phone] 07/22/22 0435 07/23/22 0447 07/24/22 0433 07/25/22 0427  WBC 7.1 7.3 7.5 9.2 6.2  HGB 9.4* 9.4* 9.3* 9.7* 9.7*  HCT 31.7* 31.9* 31.7* 33.1* 32.2*  MCV 74.8* 74.7* 75.8* 76.4* 75.6*  PLT 99* 102* 102* 103* 103*   Basic Metabolic Panel: Recent Labs  Lab 07/20/22 0427 07/21/22 0437 07/22/22 0435 07/23/22 0447 07/24/22 0433 07/25/22 0427 07/26/22 0418  NA 138   < > 139 138 140 139 136  K 3.8   < > 3.0* 3.1* 3.7 3.0* 3.3*  CL 102   <  > 97* 98 97* 98 97*  CO2 27   < > 30 30 33* 30 29  GLUCOSE 103*   < > 103* 124* 146* 142* 183*  BUN 20   < > 23 27* 31* 32* 34*  CREATININE  1.45*   < > 1.37* 1.38* 1.41* 1.43* 1.37*  CALCIUM 8.2*   < > 8.4* 8.4* 8.6* 8.3* 8.2*  MG 2.1  --   --  2.0  --   --   --    < > = values in this interval not displayed.   Liver Function Tests: Recent Labs  Lab 07/26/22 0418  AST 19  ALT 11  ALKPHOS 75  BILITOT 1.4*  PROT 6.1*  ALBUMIN 3.3*   CBG: Recent Labs  Lab 07/25/22 0720 07/25/22 1128 07/25/22 1636 07/25/22 2048 07/26/22 0832  GLUCAP 133* 133* 130* 120* 166*    Discharge time spent: greater than 55  minutes.  Signed: Kendell Bane, MD [facility] 07/26/2022

## 2022-07-26 NOTE — TOC Transition Note (Signed)
Transition of Care [facility]) - CM/SW Discharge Note   Patient Details  Name: Mike Wright MRN: [id number] Date of Birth: [birth year]  Transition of Care [facility]) CM/SW Contact:  Elliot Gault, LCSW Phone Number: 07/26/2022, 11:34 AM   Clinical Narrative:     Pt stable for dc today per MD. Updated Kandee Keen at [facility] and they will resume [facility] services for pt upon dc.  No other TOC needs identified for dc.  Final next level of care: Home w Home Health Services Barriers to Discharge: Barriers Resolved   Patient Goals and CMS Choice CMS Medicare.gov Compare Post Acute Care list provided to:: Patient Choice offered to / list presented to : Patient  Discharge Placement                         Discharge Plan and Services Additional resources added to the After Visit Summary for                            [facility] Arranged: PT HH Agency: [facility]        Social Determinants of Health (SDOH) Interventions SDOH Screenings   Food Insecurity: No Food Insecurity (07/18/2022)  Housing: Low Risk  (07/18/2022)  Transportation Needs: No Transportation Needs (07/18/2022)  Utilities: Not At Risk (07/18/2022)  Depression (PHQ2-9): Low Risk  (07/19/2021)  Financial Resource Strain: Low Risk  (05/23/2022)  Physical Activity: Inactive (07/18/2022)  Stress: No Stress Concern Present (07/29/2019)  Tobacco Use: Medium Risk (07/25/2022)     Readmission Risk Interventions    11/15/2021   10:56 AM 12/18/2020    2:00 PM 02/07/2020   12:11 PM  Readmission Risk Prevention Plan  Transportation Screening Complete Complete Complete  PCP or Specialist Appt within 5-7 Days  Complete   PCP or Specialist Appt within 3-5 Days Complete  Complete  Home Care Screening  Complete   Medication Review (RN CM)  Complete   HRI or Home Care Consult Complete  Complete  Social Work Consult for Recovery Care Planning/Counseling Complete  Complete  Palliative Care Screening Complete  Not Applicable   Medication Review Oceanographer) Complete  Complete

## 2022-07-26 NOTE — Progress Notes (Signed)
Rounding Note    Patient Name: Mike Wright Date of Encounter: 07/26/2022   [facility] Cardiologist: Dina Rich, MD   Subjective   Much more lucid today, delerium has resolved. SOB improved but not resolved.   Inpatient Medications    Scheduled Meds:  amLODipine  2.5 mg Oral Daily   apixaban  2.5 mg Oral BID   atorvastatin  80 mg Oral QPM   clopidogrel  75 mg Oral Q breakfast   insulin aspart  0-15 Units Subcutaneous TID WC   insulin aspart  0-5 Units Subcutaneous QHS   ipratropium  0.5 mg Nebulization BID   isosorbide mononitrate  60 mg Oral BID   levalbuterol  1.25 mg Nebulization BID   melatonin  6 mg Oral QHS   [START ON 07/27/2022] pantoprazole  40 mg Oral Daily   Continuous Infusions:  PRN Meds: acetaminophen **OR** acetaminophen, ALPRAZolam, haloperidol lactate, ondansetron **OR** ondansetron (ZOFRAN) IV   Vital Signs    Vitals:   07/25/22 2046 07/26/22 0420 07/26/22 0503 07/26/22 0740  BP: (!) 123/56 (!) 124/55    Pulse: (!) 48 66  91  Resp: 18 20  20   Temp: 97.7 F (36.5 C) 98.4 F (36.9 C)    TempSrc: Oral Oral    SpO2: 97% 99%  92%  Weight:   78 kg   Height:        Intake/Output Summary (Last 24 hours) at 07/26/2022 1004 Last data filed at 07/26/2022 0617 Gross per 24 hour  Intake 692.55 ml  Output 1550 ml  Net -857.45 ml      07/26/2022    5:03 AM 07/25/2022    5:00 AM 07/24/2022    3:24 AM  Last 3 Weights  Weight (lbs) 171 lb 15.3 oz 171 lb 11.8 oz 173 lb 15.1 oz  Weight (kg) 78 kg 77.9 kg 78.9 kg      Telemetry    Rate controlled afib - Personally Reviewed  ECG    N/a - Personally Reviewed  Physical Exam   GEN: No acute distress.   Neck: elevated JVD Cardiac: irreg, 3/6 systolic murmur rusb  Respiratory: Clear to auscultation bilaterally. GI: Soft, nontender, non-distended  MS: No edema Neuro:  Nonfocal  Psych: Normal affect   Labs    High Sensitivity Troponin:  No results for input(s): "TROPONINIHS"  in the last 720 hours.   Chemistry Recent Labs  Lab 07/20/22 0427 07/21/22 0437 07/23/22 0447 07/24/22 0433 07/25/22 0427 07/26/22 0418  NA 138   < > 138 140 139 136  K 3.8   < > 3.1* 3.7 3.0* 3.3*  CL 102   < > 98 97* 98 97*  CO2 27   < > 30 33* 30 29  GLUCOSE 103*   < > 124* 146* 142* 183*  BUN 20   < > 27* 31* 32* 34*  CREATININE 1.45*   < > 1.38* 1.41* 1.43* 1.37*  CALCIUM 8.2*   < > 8.4* 8.6* 8.3* 8.2*  MG 2.1  --  2.0  --   --   --   PROT  --   --   --   --   --  6.1*  ALBUMIN  --   --   --   --   --  3.3*  AST  --   --   --   --   --  19  ALT  --   --   --   --   --  11  ALKPHOS  --   --   --   --   --  75  BILITOT  --   --   --   --   --  1.4*  GFRNONAA 47*   < > 50* 49* 48* 50*  ANIONGAP 9   < > 10 10 11 10    < > = values in this interval not displayed.    Lipids No results for input(s): "CHOL", "TRIG", "HDL", "LABVLDL", "LDLCALC", "CHOLHDL" in the last 168 hours.  Hematology Recent Labs  Lab 07/23/22 0447 07/24/22 0433 07/25/22 0427  WBC 7.5 9.2 6.2  RBC 4.18* 4.33 4.26  HGB 9.3* 9.7* 9.7*  HCT 31.7* 33.1* 32.2*  MCV 75.8* 76.4* 75.6*  MCH 22.2* 22.4* 22.8*  MCHC 29.3* 29.3* 30.1  RDW 21.9* 22.2* 21.9*  PLT 102* 103* 103*   Thyroid No results for input(s): "TSH", "FREET4" in the last 168 hours.  BNP Recent Labs  Lab 07/23/22 0447 07/24/22 1459 07/26/22 0418  BNP 200.0* 236.0* 159.0*    DDimer No results for input(s): "DDIMER" in the last 168 hours.   Radiology    No results found.  Cardiac Studies     Patient Profile     Patient is 89 year old M known to have CAD s/p LAD PCI in 2019, atrial fibrillation, history of GI bleed, PAD manifested by CLI and ischemic great toes s/p amputation, chronic diastolic heart failure, chronic anemia received multiple blood transfusions, moderate aortic valve stenosis, HLD is currently admitted to hospitalist team for the management of acute on chronic HFpEF.     Assessment & Plan    1.Acute on chronic  HFpEF - 06/2022 echo LVE 70-75%, no WMAs, indet diastoilc fxn, normal RV, severe pulm HTN PASP 73, severe BAE, moderate borderline severe AS.   -neg 1.3 L yesterday, neg 17.2 L since admission per charting. We dosed IV lasix 80mg  x 2 yesterday. Renal function is stable.  - remains fluid overloaded, O2 requirement improving down from 5L to 3L.  - redose IV lasix 80mg  x 2 today.  - when ready for oral regimen would start torsemide 20mg  bid, likely KCl at home at well    2. Moderate to severe AS - - 06/2022 echo LVE 70-75%, no WMAs, indet diastoilc fxn, normal RV, severe pulm HTN PASP 73, severe BAE, moderate borderline severe AS mean grad 33, AVA VTI 1.13 DI 0.36, SVI 51 peak velocity 4 m/s. Vast majority of criteria favoring moderate range.  - significant delerium this admit, palliative has seen patient. Hold on outpatient eval for TAVR, pending general cards f/u can reconsider.    3. CAD - no acute issues   4. Afib  No acute issues - continue eliquis - self rate controlled, not on av nodal agents.    5. Chronic respiratory failure - on home O2 2L at home.      6.Anemia - transfused this admit   7.AMS/agitation/delerium -not significantly hypercapneic by ABG - no clear signt of occult infection, however UA is pending -no focal neuro findings - ongoing workup per primary team - unclear if perhaps had some sundowning, unclear if attempts at medically treating agitation with xanax, seroquel, trazodone over last few days have perhaps had adverse effects. Perhaps neurology consultation if UA benign. At baseline he is fully cognitive.    - much more lucid today. Would still follow through with UA, asked nursing to get today.  For questions or updates, please contact [facility]  Please consult [email or web address] for contact info under        Signed, Dina Rich, MD  07/26/2022, 10:04 AM

## 2022-07-27 ENCOUNTER — Telehealth: Payer: Self-pay | Admitting: Cardiology

## 2022-07-27 DIAGNOSIS — E1152 Type 2 diabetes mellitus with diabetic peripheral angiopathy with gangrene: Secondary | ICD-10-CM | POA: Diagnosis not present

## 2022-07-27 DIAGNOSIS — E782 Mixed hyperlipidemia: Secondary | ICD-10-CM | POA: Diagnosis not present

## 2022-07-27 DIAGNOSIS — Z794 Long term (current) use of insulin: Secondary | ICD-10-CM

## 2022-07-27 DIAGNOSIS — I2510 Atherosclerotic heart disease of native coronary artery without angina pectoris: Secondary | ICD-10-CM | POA: Diagnosis not present

## 2022-07-27 DIAGNOSIS — T8484XD Pain due to internal orthopedic prosthetic devices, implants and grafts, subsequent encounter: Secondary | ICD-10-CM | POA: Diagnosis not present

## 2022-07-27 DIAGNOSIS — E119 Type 2 diabetes mellitus without complications: Secondary | ICD-10-CM | POA: Diagnosis not present

## 2022-07-27 DIAGNOSIS — I110 Hypertensive heart disease with heart failure: Secondary | ICD-10-CM | POA: Diagnosis not present

## 2022-07-27 DIAGNOSIS — M25462 Effusion, left knee: Secondary | ICD-10-CM | POA: Diagnosis not present

## 2022-07-27 DIAGNOSIS — J449 Chronic obstructive pulmonary disease, unspecified: Secondary | ICD-10-CM | POA: Diagnosis not present

## 2022-07-27 DIAGNOSIS — I5033 Acute on chronic diastolic (congestive) heart failure: Secondary | ICD-10-CM | POA: Diagnosis not present

## 2022-07-27 DIAGNOSIS — I5032 Chronic diastolic (congestive) heart failure: Secondary | ICD-10-CM | POA: Diagnosis not present

## 2022-07-27 DIAGNOSIS — G2581 Restless legs syndrome: Secondary | ICD-10-CM | POA: Diagnosis not present

## 2022-07-27 DIAGNOSIS — I4891 Unspecified atrial fibrillation: Secondary | ICD-10-CM | POA: Diagnosis not present

## 2022-07-27 LAB — COMPREHENSIVE METABOLIC PANEL
ALT: 13 U/L (ref 0–44)
AST: 21 U/L (ref 15–41)
Albumin: 3.4 g/dL — ABNORMAL LOW (ref 3.5–5.0)
Alkaline Phosphatase: 80 U/L (ref 38–126)
Anion gap: 9 (ref 5–15)
BUN: 27 mg/dL — ABNORMAL HIGH (ref 8–23)
CO2: 31 mmol/L (ref 22–32)
Calcium: 8.4 mg/dL — ABNORMAL LOW (ref 8.9–10.3)
Chloride: 99 mmol/L (ref 98–111)
Creatinine, Ser: 1.16 mg/dL (ref 0.61–1.24)
GFR, Estimated: >60 mL/min (ref >60)
Glucose, Bld: 126 mg/dL — ABNORMAL HIGH (ref 70–99)
Potassium: 3.6 mmol/L (ref 3.5–5.1)
Sodium: 139 mmol/L (ref 135–145)
Total Bilirubin: 1.2 mg/dL (ref 0.3–1.2)
Total Protein: 6.2 g/dL — ABNORMAL LOW (ref 6.5–8.1)

## 2022-07-27 LAB — GLUCOSE, CAPILLARY: Glucose-Capillary: 122 mg/dL — ABNORMAL HIGH (ref 70–99)

## 2022-07-27 LAB — MAGNESIUM: Magnesium: 2.1 mg/dL (ref 1.7–2.4)

## 2022-07-27 LAB — BRAIN NATRIURETIC PEPTIDE: B Natriuretic Peptide: 226 pg/mL — ABNORMAL HIGH (ref 0.0–100.0)

## 2022-07-27 MED ORDER — TORSEMIDE 20 MG PO TABS
20.0000 mg | ORAL_TABLET | Freq: Two times a day (BID) | ORAL | Status: DC
  Administered 2022-07-27: 20 mg via ORAL
  Filled 2022-07-27: qty 1

## 2022-07-27 MED ORDER — TORSEMIDE 20 MG PO TABS: 20.0000 mg | ORAL_TABLET | Freq: Two times a day (BID) | ORAL | 1 refills | Status: DC

## 2022-07-27 NOTE — Telephone Encounter (Signed)
Spoke with patient who confirmed he did receive AVS at discharge today but has questions regarding torsemide dose and directions. Kathie Rhodes RN with [facility] says patient picked up torsemide 10 mg today from [facility]. Advised that per hospital d/c-Transitioned to torsemide 20 mg p.o. twice daily on 07/27/2022 - With 20 mg of KCl twice daily for total of 40 meq daily of potassium supplement. Verbalized understanding. Contacted [facility], spoke with Summer who confirmed that torsemide 20 mg rx was received today and that 10 mg torsemide rx would be removed from profile.

## 2022-07-27 NOTE — Progress Notes (Signed)
Palliative:  Mike Wright is sitting on the edge of the bed preparing for discharge.  He continues to improve daily.  His wife Mike Wright is present at bedside, along with nursing staff attending to needs.  Mike Wright and his wife deny questions or concerns, needs.  Plan: Continue to treat the treatable but no CPR or intubation.  Home with home health.  Follow-up with trusted cardiologist, Dr. Wyline Mood.  25 minutes Mike Carmel, NP Palliative medicine team Team phone [phone] Greater than 50% of this time was spent counseling and coordinating care related to the above assessment and plan.

## 2022-07-27 NOTE — Discharge Instructions (Signed)

## 2022-07-27 NOTE — Care Management Important Message (Signed)
Important Message  Patient Details  Name: Mike Wright MRN: [id number] Date of Birth: 16-Jul-1933   Medicare Important Message Given:  Yes     Corey Harold 07/27/2022, 11:10 AM

## 2022-07-27 NOTE — Discharge Summary (Addendum)
Physician Discharge Summary  NAHUEL WILBERT ZOX:[id number] DOB: 03/20/1934 DOA: 07/18/2022  PCP: [name], MD Cardiology: Alcus Dad  Admit date: 07/18/2022 Discharge date: 07/27/2022  Admitted From:  Home Disposition: Home with [facility]   Recommendations for Outpatient Follow-up:  Follow up with PCP in 1 weeks Follow up with cardiology office in 2-3 weeks Please obtain BMP in 2 weeks  Home Health: PT, OT, RN, SW  Discharge Condition: STABLE   CODE STATUS: DNR  DIET: heart healthy    Brief Hospitalization Summary: Please see all hospital notes, images, labs for full details of the hospitalization. ADMISSION HPI:  89 y.o. male with medical history significant of PAD, CAD, atrial fibrillation on Eliquis, diabetes mellitus who presents to the emergency department from cardiologist office.  Patient complained of 1 week onset of leg swelling which has extended to distention of the abdomen.  He takes Lasix at home as needed for swelling of legs.  However, he has been taking Lasix daily since onset of symptoms without much improvement, so he went to his cardiologist today and was asked to go to the ED for diuresis.   ED Course: In the emergency department, BP was 155/112, respiratory rate 21 bpm, other vital signs were within normal range.  Workup in the ED shows microcytic anemia, BMP was normal except for creatinine of 1.50, blood glucose 108 BNP 332.  Chest x-ray showed probable mild interstitial edema.  Small right pleural effusion.  Cardiomegaly.  IV Lasix 60 mg x 1 was given, hospitalist was asked to admit patient for further evaluation and management.  HOSPITAL COURSE BY PROBLEM LIST   Acute on chronic diastolic CHF with acute on chronic hypoxemia -Diuresing well, O2 demand has improved from 5 to 3 L, satting 98% today -At home requires 2 L     -Continue total input/output, daily weights and fluid restriction -initially started on IV Lasix 60, increased to 80 mg IV twice a day by  cardiology  -Transitioned to torsemide 20 mg p.o. twice daily on 07/27/2022 - With 20 mg of KCl twice daily for total of 40 meq daily of potassium supplement   --------------------------------------------------------------------------------------------- -EKG personally reviewed showed atrial fibrillation with rate controlled -Echocardiogram done on 11/11/2021 showed LVEF of 70 to 75%.  No RWMA.   -Repeat echo 07/19/2022 reveals severe aortic valve stenosis    Filed Weights   07/25/22 0500 07/26/22 0503 07/27/22 0407  Weight: 77.9 kg 78 kg 80.9 kg    Intake/Output Summary (Last 24 hours) at 07/27/2022 0919 Last data filed at 07/27/2022 0900 Gross per 24 hour  Intake 240 ml  Output 1250 ml  Net -1010 ml     Moderate to severe aortic valve stenosis: Echocardiogram from 07/19/2022 showed aortic valve V-max of 4.03 m/s consistent with severe aortic valve stenosis  Medication optimization by cardiology, follow-up with structural heart clinic - has appointment with Dr. Clifton James on 08/01/2022.      Chronic atrial fibrillation with rate control Continue apixaban for full anticoagulation   Microcytic anemia MCV 72.9, iron studies show deficiency, but patient is allergic to IV iron and therefore will be started on oral ferrous sulfate 07/20/2022- S/p  2U PRBC transfusion >>> hemoglobin 7.0 >>>> 9.7 - stable      CAD/PAD He Presented with ischemia with close in June 2019 which required intervention of lower extremity on the outside.  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 Continue Lipitor, Eliquis, Plavix, atorvastatin 80 mg, Imdur 60 mg twice daily - PCI in  2019   Hypokalemia  -Potassium still running low, repleting orally and checking magnesium -Planning for KCl 20 mill equivalent p.o. twice daily     mixed hyperlipidemia High intensity statin, atorvastatin 80 mg daily   Essential hypertension Continue amlodipine, Imdur   Type 2 diabetes mellitus Resume home regimen,  diabetic diet   Obesity BMI 30.52   Per patient/wife adverse reaction, allergy to iron: IV or p.o. iron supplements Medical records updated   Sundowning, encephalopathy  Due to medications, and physical decline Titrating medication, as needed Haldol   Ethics: Patient is declining physically and mentally, his current condition comorbidities progressive decline was discussed with the patient, his wife and his niece at bedside   Prognosis remain poor Patient IS DNR   Discharge Diagnoses:  Principal Problem:   Acute on chronic diastolic CHF (congestive heart failure) (HCC) Active Problems:   Essential hypertension   Type 2 diabetes mellitus (HCC)   Microcytic anemia   Coronary artery disease involving native coronary artery of native heart without angina pectoris   PAD (peripheral artery disease) (HCC)   Atrial fibrillation, chronic (HCC)   Mixed hyperlipidemia   Discharge Instructions: Discharge Instructions     (HEART FAILURE PATIENTS) Call MD:  Anytime you have any of the following symptoms: 1) 3 pound weight gain in 24 hours or 5 pounds in 1 week 2) shortness of breath, with or without a dry hacking cough 3) swelling in the hands, feet or stomach 4) if you have to sleep on extra pillows at night in order to breathe.   Complete by: As directed    AMB referral to CHF clinic   Complete by: As directed    Activity as tolerated - No restrictions   Complete by: As directed    Call MD for:  difficulty breathing, headache or visual disturbances   Complete by: As directed    Call MD for:  persistant dizziness or light-headedness   Complete by: As directed    Call MD for:  redness, tenderness, or signs of infection (pain, swelling, redness, odor or green/yellow discharge around incision site)   Complete by: As directed    Call MD for:  severe uncontrolled pain   Complete by: As directed    Call MD for:  temperature >100.4   Complete by: As directed    Diet - low sodium heart  healthy   Complete by: As directed    Discharge instructions   Complete by: As directed    Monitor daily weight, any increase in weight 3-5 pounds, lower extremity swelling, shortness of breath will notify your heart failure team cardiologist Cardiologist within 1 week Follow with the PCP in 1-2 weeks   Increase activity slowly   Complete by: As directed       Allergies as of 07/27/2022       Reactions   Codeine Shortness Of Breath   Doxycycline Swelling   Swelling and numbness in lips and face. Swelling improved after stopping. Reports still experiences numbness in bottom lip.    Feraheme [ferumoxytol] Other (See Comments)   Diaphoretic, chest pain   Heparin Other (See Comments)   +HIT,  Severe bleeding (with heparin drip & large doses), tolerates low doses   Iron Shortness Of Breath   Patient severe reaction to IV iron and does not tolerate oral formulations either   Losartan Swelling   Oxycodone Other (See Comments)   "Made me act out of my mind" Mental status changes- hallucinations   Latex  Rash        Medication List     STOP taking these medications    furosemide 40 MG tablet Commonly known as: LASIX       TAKE these medications    Accu-Chek Guide w/Device Kit   Accu-Chek Softclix Lancets lancets   acetaminophen 500 MG tablet Commonly known as: TYLENOL Take 1 tablet (500 mg total) by mouth every 6 (six) hours as needed for mild pain or headache. What changed:  how much to take when to take this   amLODipine 2.5 MG tablet Commonly known as: NORVASC Take 2.5 mg by mouth daily.   apixaban 2.5 MG Tabs tablet Commonly known as: ELIQUIS Take 1 tablet (2.5 mg total) by mouth 2 (two) times daily. What changed: how much to take   atorvastatin 80 MG tablet Commonly known as: LIPITOR Take 80 mg by mouth every evening.   B-12 2500 MCG Tabs Take 2,500 mcg by mouth daily.   B-D SINGLE USE SWABS REGULAR Pads   clopidogrel 75 MG tablet Commonly known  as: PLAVIX Take 1 tablet (75 mg total) by mouth daily with breakfast.   Droplet Insulin Syringe 31G X 5/16" 1 ML Misc Generic drug: Insulin Syringe-Needle U-100   folic acid 1 MG tablet Commonly known as: FOLVITE Take 1 tablet (1 mg total) by mouth daily.   insulin glargine 100 UNIT/ML injection Commonly known as: LANTUS Inject 30 Units into the skin 2 (two) times daily as needed.   ipratropium-albuterol 0.5-2.5 (3) MG/3ML Soln Commonly known as: DUONEB Take 3 mLs by nebulization every other day.   isosorbide mononitrate 60 MG 24 hr tablet Commonly known as: IMDUR TAKE 1 TABLET TWICE DAILY (DOSE INCREASE 10/26/21) What changed: See the new instructions.   nitroGLYCERIN 0.4 MG SL tablet Commonly known as: NITROSTAT PLACE 1 TABLET (0.4 MG TOTAL) UNDER THE TONGUE EVERY 5  MINUTES AS NEEDED FOR CHEST PAIN. What changed: See the new instructions.   OXYGEN Inhale 2 L/min into the lungs See admin instructions. 2 L/min of oxygen at bedtime and during the day as needed for shortness of breath   pantoprazole 40 MG tablet Commonly known as: Protonix Take 1 tablet (40 mg total) by mouth 2 (two) times daily. To protect stomach while taking multiple blood thinners. What changed: when to take this   potassium chloride SA 20 MEQ tablet Commonly known as: KLOR-CON M Take 1 tablet (20 mEq total) by mouth 2 (two) times daily. What changed: when to take this   Spiriva HandiHaler 18 MCG inhalation capsule Generic drug: tiotropium Place 1 capsule into inhaler and inhale daily after breakfast.   torsemide 20 MG tablet Commonly known as: DEMADEX Take 1 tablet (20 mg total) by mouth 2 (two) times daily.   traZODone 100 MG tablet Commonly known as: DESYREL Take 200 mg by mouth at bedtime. What changed: Another medication with the same name was removed. Continue taking this medication, and follow the directions you see here.   True Metrix Blood Glucose Test test strip Generic drug: glucose  blood   Vitamin D3 50 MCG (2000 UT) Tabs Take 2,000 Units by mouth 2 (two) times daily.        Follow-up Information     [facility] Follow up.   Specialty: Home Health Services Why: PT will call to schedule your next visit. Contact information: [address] [place] [address] [phone]         [name], MD. Schedule  an appointment as soon as possible for a visit in 1 week(s).   Specialty: Internal Medicine Why: Hospital Follow Up Contact information: [address] [place] [address] [phone]         [facility]. Schedule an appointment as soon as possible for a visit in 3 week(s).   Specialty: Cardiology Why: Hospital Follow Up Contact information: [address] [id number] [address] [address] [phone]               Allergies  Allergen Reactions   Codeine Shortness Of Breath   Doxycycline Swelling    Swelling and numbness in lips and face. Swelling improved after stopping. Reports still experiences numbness in bottom lip.    Feraheme [Ferumoxytol] Other (See Comments)    Diaphoretic, chest pain   Heparin Other (See Comments)    +HIT,  Severe bleeding (with heparin drip & large doses), tolerates low doses   Iron Shortness Of Breath    Patient severe reaction to IV iron and does not tolerate oral formulations either   Losartan Swelling   Oxycodone Other (See Comments)    "Made me act out of my mind" Mental status changes- hallucinations   Latex Rash   Allergies as of 07/27/2022       Reactions   Codeine Shortness Of Breath   Doxycycline Swelling   Swelling and numbness in lips and face. Swelling improved after stopping. Reports still experiences numbness in bottom lip.    Feraheme [ferumoxytol] Other (See Comments)   Diaphoretic, chest pain   Heparin Other (See Comments)   +HIT,  Severe bleeding (with heparin drip & large doses), tolerates low doses   Iron  Shortness Of Breath   Patient severe reaction to IV iron and does not tolerate oral formulations either   Losartan Swelling   Oxycodone Other (See Comments)   "Made me act out of my mind" Mental status changes- hallucinations   Latex Rash        Medication List     STOP taking these medications    furosemide 40 MG tablet Commonly known as: LASIX       TAKE these medications    Accu-Chek Guide w/Device Kit   Accu-Chek Softclix Lancets lancets   acetaminophen 500 MG tablet Commonly known as: TYLENOL Take 1 tablet (500 mg total) by mouth every 6 (six) hours as needed for mild pain or headache. What changed:  how much to take when to take this   amLODipine 2.5 MG tablet Commonly known as: NORVASC Take 2.5 mg by mouth daily.   apixaban 2.5 MG Tabs tablet Commonly known as: ELIQUIS Take 1 tablet (2.5 mg total) by mouth 2 (two) times daily. What changed: how much to take   atorvastatin 80 MG tablet Commonly known as: LIPITOR Take 80 mg by mouth every evening.   B-12 2500 MCG Tabs Take 2,500 mcg by mouth daily.   B-D SINGLE USE SWABS REGULAR Pads   clopidogrel 75 MG tablet Commonly known as: PLAVIX Take 1 tablet (75 mg total) by mouth daily with breakfast.   Droplet Insulin Syringe 31G X 5/16" 1 ML Misc Generic drug: Insulin Syringe-Needle U-100   folic acid 1 MG tablet Commonly known as: FOLVITE Take 1 tablet (1 mg total) by mouth daily.   insulin glargine 100 UNIT/ML injection Commonly known as: LANTUS Inject 30 Units into the skin 2 (two) times daily as needed.   ipratropium-albuterol 0.5-2.5 (3) MG/3ML Soln Commonly known as:  DUONEB Take 3 mLs by nebulization every other day.   isosorbide mononitrate 60 MG 24 hr tablet Commonly known as: IMDUR TAKE 1 TABLET TWICE DAILY (DOSE INCREASE 10/26/21) What changed: See the new instructions.   nitroGLYCERIN 0.4 MG SL tablet Commonly known as: NITROSTAT PLACE 1 TABLET (0.4 MG TOTAL) UNDER THE TONGUE  EVERY 5  MINUTES AS NEEDED FOR CHEST PAIN. What changed: See the new instructions.   OXYGEN Inhale 2 L/min into the lungs See admin instructions. 2 L/min of oxygen at bedtime and during the day as needed for shortness of breath   pantoprazole 40 MG tablet Commonly known as: Protonix Take 1 tablet (40 mg total) by mouth 2 (two) times daily. To protect stomach while taking multiple blood thinners. What changed: when to take this   potassium chloride SA 20 MEQ tablet Commonly known as: KLOR-CON M Take 1 tablet (20 mEq total) by mouth 2 (two) times daily. What changed: when to take this   Spiriva HandiHaler 18 MCG inhalation capsule Generic drug: tiotropium Place 1 capsule into inhaler and inhale daily after breakfast.   torsemide 20 MG tablet Commonly known as: DEMADEX Take 1 tablet (20 mg total) by mouth 2 (two) times daily.   traZODone 100 MG tablet Commonly known as: DESYREL Take 200 mg by mouth at bedtime. What changed: Another medication with the same name was removed. Continue taking this medication, and follow the directions you see here.   True Metrix Blood Glucose Test test strip Generic drug: glucose blood   Vitamin D3 50 MCG (2000 UT) Tabs Take 2,000 Units by mouth 2 (two) times daily.        Procedures/Studies: DG CHEST PORT 1 VIEW  Result Date: 07/24/2022 CLINICAL DATA:  Shortness of breath. EXAM: PORTABLE CHEST 1 VIEW COMPARISON:  Radiograph 07/22/2022 FINDINGS: Persistent low lung volumes. The heart is enlarged, stable. Unchanged mediastinal contours. Diffuse interstitial opacities, not significantly changed from prior exam. There is a small right pleural effusion. Improving right upper lobe opacity. No pneumothorax. IMPRESSION: 1. Unchanged cardiomegaly and diffuse interstitial opacities, suspicious for pulmonary edema. 2. Small right pleural effusion. 3. Improving right upper lobe opacity. Electronically Signed   By: Narda Rutherford M.D.   On: 07/24/2022 12:24    DG CHEST PORT 1 VIEW  Result Date: 07/22/2022 CLINICAL DATA:  Shortness of breath EXAM: PORTABLE CHEST 1 VIEW COMPARISON:  CXR 07/18/22 FINDINGS: Low lung volumes. No pleural effusion. No pneumothorax. Cardiomegaly. There are prominent bilateral interstitial opacities, which could represent pulmonary venous congestion or mild pulmonary edema. There appear to be a more focal opacities in the right upper and lower lobes, which could represent infection. No radiographically apparent displaced rib fractures. Visualized upper abdomen is unremarkable. IMPRESSION: 1. Cardiomegaly with prominent bilateral interstitial opacities, which could represent pulmonary venous congestion or mild pulmonary edema. 2. There appear to be more focal opacities in the right upper and lower lobes, which could represent infection. Electronically Signed   By: Lorenza Cambridge M.D.   On: 07/22/2022 08:23   ECHOCARDIOGRAM COMPLETE  Result Date: 07/19/2022    ECHOCARDIOGRAM REPORT   Patient Name:   CORNELIO PARKERSON Date of Exam: 07/19/2022 Medical Rec #:  [id number]       Height:       67.0 in Accession #:    [id number]      Weight:       194.9 lb Date of Birth:  11-26-1933       BSA:  2.000 m Patient Age:    88 years        BP:           136/56 mmHg Patient Gender: M               HR:           69 bpm. [facility] Procedure: 2D Echo, Cardiac Doppler and Color Doppler Indications:    CHF-Acute Diastolic I50.31  History:        Patient has prior history of Echocardiogram examinations, most                 recent 11/11/2021. CHF, CAD, Arrythmias:Atrial Fibrillation,                 Bradycardia and Atrial Flutter; Risk Factors:Hypertension,                 Diabetes and Dyslipidemia. Sleep apnea,                 PAD (peripheral artery disease).  Sonographer:    Celesta Gentile RCS Referring Phys: [id number] OLADAPO ADEFESO IMPRESSIONS  1. Left ventricular ejection fraction, by estimation, is 70 to 75%. The left ventricle has  hyperdynamic function. The left ventricle has no regional wall motion abnormalities. There is mild left ventricular hypertrophy. Left ventricular diastolic function could not be evaluated due to atrial flutter.  2. Right ventricular systolic function is normal. The right ventricular size is normal. There is severely elevated pulmonary artery systolic pressure. The estimated right ventricular systolic pressure is 73.1 mmHg.  3. Left atrial size was severely dilated.  4. Right atrial size was severely dilated.  5. The mitral valve is abnormal. No evidence of mitral valve regurgitation. No evidence of mitral stenosis. Severe mitral annular calcification.  6. The tricuspid valve is abnormal. Tricuspid valve regurgitation is mild to moderate.  7. The aortic valve is calcified. Aortic valve regurgitation is mild. Moderate to severe aortic valve stenosis. Aortic valve area, by VTI measures 1.13 cm. Aortic valve mean gradient measures 33.0 mmHg. Aortic valve Vmax measures 4.03 m/s. AV DVI 0.33.  8. The inferior vena cava is dilated in size with <50% respiratory variability, suggesting right atrial pressure of 15 mmHg.  9. Increased flow velocities may be secondary to anemia, thyrotoxicosis, hyperdynamic or high flow state. Comparison(s): Changes from prior study are noted. AS worsened from moderate (DVI 0.33, Vmax 3.46 m/sec, mean PG 25 mm Hg) in 10/2021 to moderate-severe (DVI 0.33, Vmax 4.03 m/sec, mean PG 33 mm Hg) now. FINDINGS  Left Ventricle: Left ventricular ejection fraction, by estimation, is 70 to 75%. The left ventricle has hyperdynamic function. The left ventricle has no regional wall motion abnormalities. The left ventricular internal cavity size was normal in size. There is mild left ventricular hypertrophy. Left ventricular diastolic function could not be evaluated due to atrial fibrillation. Left ventricular diastolic function could not be evaluated. Right Ventricle: The right ventricular size is normal. No  increase in right ventricular wall thickness. Right ventricular systolic function is normal. There is severely elevated pulmonary artery systolic pressure. The tricuspid regurgitant velocity is 3.81 m/s, and with an assumed right atrial pressure of 15 mmHg, the estimated right ventricular systolic pressure is 73.1 mmHg. Left Atrium: Left atrial size was severely dilated. Right Atrium: Right atrial size was severely dilated. Pericardium: There is no evidence of pericardial effusion. Mitral Valve: The mitral valve is abnormal. Severe mitral annular calcification. No evidence of mitral valve  regurgitation. No evidence of mitral valve stenosis. Tricuspid Valve: The tricuspid valve is abnormal. Tricuspid valve regurgitation is mild to moderate. No evidence of tricuspid stenosis. Aortic Valve: The aortic valve is calcified. Aortic valve regurgitation is mild. Aortic regurgitation PHT measures 469 msec. Moderate to severe aortic stenosis is present. Aortic valve mean gradient measures 33.0 mmHg. Aortic valve peak gradient measures  64.8 mmHg. Aortic valve area, by VTI measures 1.13 cm. Pulmonic Valve: The pulmonic valve was normal in structure. Pulmonic valve regurgitation is trivial. No evidence of pulmonic stenosis. Aorta: The aortic root is normal in size and structure. Venous: The inferior vena cava is dilated in size with less than 50% respiratory variability, suggesting right atrial pressure of 15 mmHg. IAS/Shunts: No atrial level shunt detected by color flow Doppler.  LEFT VENTRICLE PLAX 2D LVIDd:         5.10 cm   Diastology LVIDs:         2.50 cm   LV e' medial:    6.74 cm/s LV PW:         1.30 cm   LV E/e' medial:  21.8 LV IVS:        1.10 cm   LV e' lateral:   8.38 cm/s LVOT diam:     2.00 cm   LV E/e' lateral: 17.5 LV SV:         101 LV SV Index:   51 LVOT Area:     3.14 cm  RIGHT VENTRICLE RV S prime:     11.90 cm/s TAPSE (M-mode): 2.0 cm LEFT ATRIUM              Index        RIGHT ATRIUM           Index LA  diam:        4.90 cm  2.45 cm/m   RA Area:     32.20 cm LA Vol (A2C):   112.0 ml 56.00 ml/m  RA Volume:   120.00 ml 60.00 ml/m LA Vol (A4C):   115.0 ml 57.50 ml/m LA Biplane Vol: 121.0 ml 60.50 ml/m  AORTIC VALVE AV Area (Vmax):    1.05 cm AV Area (Vmean):   1.05 cm AV Area (VTI):     1.13 cm AV Vmax:           402.50 cm/s AV Vmean:          260.000 cm/s AV VTI:            0.900 m AV Peak Grad:      64.8 mmHg AV Mean Grad:      33.0 mmHg LVOT Vmax:         134.00 cm/s LVOT Vmean:        86.700 cm/s LVOT VTI:          0.323 m LVOT/AV VTI ratio: 0.36 AI PHT:            469 msec  AORTA Ao Root diam: 3.20 cm MITRAL VALVE                TRICUSPID VALVE MV Area (PHT): 4.06 cm     TR Peak grad:   58.1 mmHg MV Decel Time: 187 msec     TR Vmax:        381.00 cm/s MV E velocity: 147.00 cm/s MV A velocity: 67.00 cm/s   SHUNTS MV E/A ratio:  2.19         Systemic VTI:  0.32  m                             Systemic Diam: 2.00 cm Vishnu Priya Mallipeddi Electronically signed by Winfield Rast Mallipeddi Signature Date/Time: 07/19/2022/2:48:15 PM    Final    DG Chest 2 View  Result Date: 07/18/2022 CLINICAL DATA:  swelling EXAM: CHEST - 2 VIEW COMPARISON:  Chest x-ray July 26, 2019. FINDINGS: Mild diffuse interstitial opacities. Small right pleural effusion. No visible pneumothorax. Enlarged cardiac silhouette. IMPRESSION: 1. Probable mild interstitial edema.  Small right pleural effusion. 2. Cardiomegaly. Electronically Signed   By: Feliberto Harts M.D.   On: 07/18/2022 15:58     Subjective: Pt feels well, eager to go home, no CP, no SOB.   Discharge Exam: Vitals:   07/27/22 0407 07/27/22 0724  BP: (!) 136/52   Pulse: 64   Resp: 18   Temp: 98 F (36.7 C)   SpO2: 92% 94%   Vitals:   07/26/22 2051 07/26/22 2200 07/27/22 0407 07/27/22 0724  BP: (!) 140/55 (!) 142/45 (!) 136/52   Pulse: (!) 59 63 64   Resp: 20 20 18    Temp: 98.2 F (36.8 C) 98.6 F (37 C) 98 F (36.7 C)   TempSrc: Oral Oral Oral    SpO2: 97% 98% 92% 94%  Weight:   80.9 kg   Height:       General: Pt is alert, awake, not in acute distress Cardiovascular: normal S1/S2 +, no rubs, no gallops Respiratory: CTA bilaterally, no wheezing, no rhonchi Abdominal: Soft, NT, ND, bowel sounds + Extremities: trace pretibial edema, no cyanosis   The results of significant diagnostics from this hospitalization (including imaging, microbiology, ancillary and laboratory) are listed below for reference.     Microbiology: No results found for this or any previous visit (from the past 240 hour(s)).   Labs: BNP (last 3 results) Recent Labs    07/24/22 1459 07/26/22 0418 07/27/22 0338  BNP 236.0* 159.0* 226.0*   Basic Metabolic Panel: Recent Labs  Lab 07/23/22 0447 07/24/22 0433 07/25/22 0427 07/26/22 0418 07/27/22 0338  NA 138 140 139 136 139  K 3.1* 3.7 3.0* 3.3* 3.6  CL 98 97* 98 97* 99  CO2 30 33* 30 29 31   GLUCOSE 124* 146* 142* 183* 126*  BUN 27* 31* 32* 34* 27*  CREATININE 1.38* 1.41* 1.43* 1.37* 1.16  CALCIUM 8.4* 8.6* 8.3* 8.2* 8.4*  MG 2.0  --   --   --  2.1   Liver Function Tests: Recent Labs  Lab 07/26/22 0418 07/27/22 0338  AST 19 21  ALT 11 13  ALKPHOS 75 80  BILITOT 1.4* 1.2  PROT 6.1* 6.2*  ALBUMIN 3.3* 3.4*   No results for input(s): "LIPASE", "AMYLASE" in the last 168 hours. No results for input(s): "AMMONIA" in the last 168 hours. CBC: Recent Labs  Lab 07/21/22 0437 07/22/22 0435 07/23/22 0447 07/24/22 0433 07/25/22 0427  WBC 7.1 7.3 7.5 9.2 6.2  HGB 9.4* 9.4* 9.3* 9.7* 9.7*  HCT 31.7* 31.9* 31.7* 33.1* 32.2*  MCV 74.8* 74.7* 75.8* 76.4* 75.6*  PLT 99* 102* 102* 103* 103*   Cardiac Enzymes: No results for input(s): "CKTOTAL", "CKMB", "CKMBINDEX", "TROPONINI" in the last 168 hours. BNP: Invalid input(s): "POCBNP" CBG: Recent Labs  Lab 07/26/22 1117 07/26/22 1628 07/26/22 2050 07/26/22 2202 07/27/22 0735  GLUCAP 165* 136* 192* 178* 122*   D-Dimer No results for  input(s): "DDIMER" in the  last 72 hours. Hgb A1c No results for input(s): "HGBA1C" in the last 72 hours. Lipid Profile No results for input(s): "CHOL", "HDL", "LDLCALC", "TRIG", "CHOLHDL", "LDLDIRECT" in the last 72 hours. Thyroid function studies No results for input(s): "TSH", "T4TOTAL", "T3FREE", "THYROIDAB" in the last 72 hours.  Invalid input(s): "FREET3" Anemia work up No results for input(s): "VITAMINB12", "FOLATE", "FERRITIN", "TIBC", "IRON", "RETICCTPCT" in the last 72 hours. Urinalysis    Component Value Date/Time   COLORURINE YELLOW 07/26/2022 1345   APPEARANCEUR CLEAR 07/26/2022 1345   LABSPEC 1.010 07/26/2022 1345   PHURINE 5.0 07/26/2022 1345   GLUCOSEU NEGATIVE 07/26/2022 1345   HGBUR NEGATIVE 07/26/2022 1345   BILIRUBINUR NEGATIVE 07/26/2022 1345   KETONESUR NEGATIVE 07/26/2022 1345   PROTEINUR NEGATIVE 07/26/2022 1345   NITRITE NEGATIVE 07/26/2022 1345   LEUKOCYTESUR NEGATIVE 07/26/2022 1345   Sepsis Labs Recent Labs  Lab 07/22/22 0435 07/23/22 0447 07/24/22 0433 07/25/22 0427  WBC 7.3 7.5 9.2 6.2   Microbiology No results found for this or any previous visit (from the past 240 hour(s)).  Time coordinating discharge:   SIGNED:  Standley Dakins, MD  [facility] 07/27/2022, 9:19 AM How to contact the [facility] Attending or Consulting provider 7A - 7P or covering provider during after hours 7P -7A, for this patient?  Check the care team in [facility] and look for a) attending/consulting [facility] provider listed and b) the [facility] team listed Log into [email or web address] and use [place]'s universal password to access. If you do not have the password, please contact the hospital operator. Locate the [facility] provider you are looking for under [facility] and page to a number that you can be directly reached. If you still have difficulty reaching the provider, please page the [facility] (Director on Call) for the Hospitalists listed on amion for assistance.

## 2022-07-27 NOTE — Telephone Encounter (Signed)
Mike Wright pt's home health nurse called in to clarify what is the correct dosage of torsemide pt should be taking.

## 2022-07-27 NOTE — Consult Note (Signed)
   [facility] Inpatient Consult   07/27/2022  JIM LUNDIN [birth year] [id number]  Location: [facility] RN Hospital Liaison [facility]).   [facility] Customer service manager [facility]) Accountable Care Organization [ACO] Patient: Chemical engineer)    Primary Care Provider: [name], MD  [facility] Internal Medicine   Patient currently active with [facility] RN care coordinator. [facility] liaison made [facility] RN aware of pt's admission and current discharge disposition however will continue to follow discharge plans and update [facility] RN care manager accordingly with noted changes.  [facility] Care Management/Population Health does not replace or interfere with any arrangements made by the Inpatient Transition of Care team.   For questions contact:    Elliot Cousin, RN, BSN [facility] Liaison [place]   [facility]  Population Health Office Hours MTWF 8:00 am to 6 pm off on Thursday [phone] mobile [phone] [Office toll free line][facility] Office Hours are M-F 8:30 - 5 pm 24 hour nurse advise line [phone] Conceirge  [email or web address]

## 2022-07-27 NOTE — Progress Notes (Signed)
Rounding Note    Patient Name: Mike Wright Date of Encounter: 07/27/2022  [facility] Cardiologist: Dina Rich, MD   Subjective   SOB much improved  Inpatient Medications    Scheduled Meds:  amLODipine  2.5 mg Oral Daily   apixaban  2.5 mg Oral BID   atorvastatin  80 mg Oral QPM   clopidogrel  75 mg Oral Q breakfast   insulin aspart  0-15 Units Subcutaneous TID WC   insulin aspart  0-5 Units Subcutaneous QHS   ipratropium  0.5 mg Nebulization BID   isosorbide mononitrate  60 mg Oral BID   levalbuterol  1.25 mg Nebulization BID   melatonin  6 mg Oral QHS   pantoprazole  40 mg Oral Daily   torsemide  20 mg Oral BID   Continuous Infusions:  PRN Meds: acetaminophen **OR** acetaminophen, ALPRAZolam, haloperidol lactate, ondansetron **OR** ondansetron (ZOFRAN) IV   Vital Signs    Vitals:   07/26/22 2051 07/26/22 2200 07/27/22 0407 07/27/22 0724  BP: (!) 140/55 (!) 142/45 (!) 136/52   Pulse: (!) 59 63 64   Resp: 20 20 18    Temp: 98.2 F (36.8 C) 98.6 F (37 C) 98 F (36.7 C)   TempSrc: Oral Oral Oral   SpO2: 97% 98% 92% 94%  Weight:   80.9 kg   Height:        Intake/Output Summary (Last 24 hours) at 07/27/2022 0836 Last data filed at 07/27/2022 0300 Gross per 24 hour  Intake --  Output 1250 ml  Net -1250 ml      07/27/2022    4:07 AM 07/26/2022    5:03 AM 07/25/2022    5:00 AM  Last 3 Weights  Weight (lbs) 178 lb 5.6 oz 171 lb 15.3 oz 171 lb 11.8 oz  Weight (kg) 80.9 kg 78 kg 77.9 kg      Telemetry    Rate controlled afib - Personally Reviewed  ECG    N/a - Personally Reviewed  Physical Exam   GEN: No acute distress.   Neck: No JVD Cardiac: irreg  Respiratory:faint bilaterla crackles GI: Soft, nontender, non-distended  MS: No edema; No deformity. Neuro:  Nonfocal  Psych: Normal affect   Labs    High Sensitivity Troponin:  No results for input(s): "TROPONINIHS" in the last 720 hours.   Chemistry Recent Labs  Lab  07/23/22 0447 07/24/22 0433 07/25/22 0427 07/26/22 0418 07/27/22 0338  NA 138   < > 139 136 139  K 3.1*   < > 3.0* 3.3* 3.6  CL 98   < > 98 97* 99  CO2 30   < > 30 29 31   GLUCOSE 124*   < > 142* 183* 126*  BUN 27*   < > 32* 34* 27*  CREATININE 1.38*   < > 1.43* 1.37* 1.16  CALCIUM 8.4*   < > 8.3* 8.2* 8.4*  MG 2.0  --   --   --  2.1  PROT  --   --   --  6.1* 6.2*  ALBUMIN  --   --   --  3.3* 3.4*  AST  --   --   --  19 21  ALT  --   --   --  11 13  ALKPHOS  --   --   --  75 80  BILITOT  --   --   --  1.4* 1.2  GFRNONAA 50*   < > 48* 50* >60  ANIONGAP 10   < > 11 10 9    < > = values in this interval not displayed.    Lipids No results for input(s): "CHOL", "TRIG", "HDL", "LABVLDL", "LDLCALC", "CHOLHDL" in the last 168 hours.  Hematology Recent Labs  Lab 07/23/22 0447 07/24/22 0433 07/25/22 0427  WBC 7.5 9.2 6.2  RBC 4.18* 4.33 4.26  HGB 9.3* 9.7* 9.7*  HCT 31.7* 33.1* 32.2*  MCV 75.8* 76.4* 75.6*  MCH 22.2* 22.4* 22.8*  MCHC 29.3* 29.3* 30.1  RDW 21.9* 22.2* 21.9*  PLT 102* 103* 103*   Thyroid No results for input(s): "TSH", "FREET4" in the last 168 hours.  BNP Recent Labs  Lab 07/24/22 1459 07/26/22 0418 07/27/22 0338  BNP 236.0* 159.0* 226.0*    DDimer No results for input(s): "DDIMER" in the last 168 hours.   Radiology    No results found.  Cardiac Studies     Patient Profile     Patient is 89 year old M known to have CAD s/p LAD PCI in 2019, atrial fibrillation, history of GI bleed, PAD manifested by CLI and ischemic great toes s/p amputation, chronic diastolic heart failure, chronic anemia received multiple blood transfusions, moderate aortic valve stenosis, HLD is currently admitted to hospitalist team for the management of acute on chronic HFpEF.   Assessment & Plan    1.Acute on chronic HFpEF    - 06/2022 echo LVE 70-75%, no WMAs, indet diastoilc fxn, normal RV, severe pulm HTN PASP 73, severe BAE, moderate borderline severe AS.    I/Os  incomplete yesterday, negative at least 18 L since admission. Received IV lasix 80mg  bid yesterday, downtrend in Cr consistent with venous congestion and HF. - nearing euvolemia. Intermittent hospital delerium, I think we need to go ahead and get him home. Start oral torsemide 20mg  bid. Would send homeo n  KCl daily.       2. Moderate to severe AS - - 06/2022 echo LVE 70-75%, no WMAs, indet diastoilc fxn, normal RV, severe pulm HTN PASP 73, severe BAE, moderate borderline severe AS mean grad 33, AVA VTI 1.13 DI 0.36, SVI 51 peak velocity 4 m/s. Vast majority of criteria favoring moderate range.   - significant delerium this admit, palliative has seen patient. Hold on outpatient eval for TAVR, pending general cards f/u can reconsider. I think majority of criteria are on the moderate end of the spectrum and ok to hold on referral for now.    3. CAD - no acute issues   4. Afib  No acute issues - continue eliquis - self rate controlled, not on av nodal agents.    5. Chronic respiratory failure - on home O2 2L at home.      6.Anemia - transfused this admit   7.AMS/agitation/delerium -not significantly hypercapneic by ABG - no clear signt of occult infection, however UA is pending -no focal neuro findings - UA was benign - more lucid last 2 days, back to baseline   [facility] for discharge, we will arrange outpatient f/u. We will sign off inpatient care.    For questions or updates, please contact [facility] Please consult [email or web address] for contact info under        Signed, Dina Rich, MD  07/27/2022, 8:36 AM

## 2022-07-28 ENCOUNTER — Encounter: Payer: Self-pay | Admitting: *Deleted

## 2022-07-28 ENCOUNTER — Ambulatory Visit: Payer: Self-pay | Admitting: *Deleted

## 2022-07-29 DIAGNOSIS — J449 Chronic obstructive pulmonary disease, unspecified: Secondary | ICD-10-CM | POA: Diagnosis not present

## 2022-07-29 DIAGNOSIS — I4891 Unspecified atrial fibrillation: Secondary | ICD-10-CM | POA: Diagnosis not present

## 2022-07-29 DIAGNOSIS — I5032 Chronic diastolic (congestive) heart failure: Secondary | ICD-10-CM | POA: Diagnosis not present

## 2022-07-29 DIAGNOSIS — E119 Type 2 diabetes mellitus without complications: Secondary | ICD-10-CM | POA: Diagnosis not present

## 2022-07-29 DIAGNOSIS — M25462 Effusion, left knee: Secondary | ICD-10-CM | POA: Diagnosis not present

## 2022-07-29 DIAGNOSIS — T8484XD Pain due to internal orthopedic prosthetic devices, implants and grafts, subsequent encounter: Secondary | ICD-10-CM | POA: Diagnosis not present

## 2022-07-29 DIAGNOSIS — I2510 Atherosclerotic heart disease of native coronary artery without angina pectoris: Secondary | ICD-10-CM | POA: Diagnosis not present

## 2022-07-29 DIAGNOSIS — G2581 Restless legs syndrome: Secondary | ICD-10-CM | POA: Diagnosis not present

## 2022-07-29 DIAGNOSIS — I110 Hypertensive heart disease with heart failure: Secondary | ICD-10-CM | POA: Diagnosis not present

## 2022-08-01 ENCOUNTER — Institutional Professional Consult (permissible substitution): Payer: Medicare HMO | Admitting: Cardiovascular Disease

## 2022-08-01 DIAGNOSIS — M25462 Effusion, left knee: Secondary | ICD-10-CM | POA: Diagnosis not present

## 2022-08-01 DIAGNOSIS — I2510 Atherosclerotic heart disease of native coronary artery without angina pectoris: Secondary | ICD-10-CM | POA: Diagnosis not present

## 2022-08-01 DIAGNOSIS — I5032 Chronic diastolic (congestive) heart failure: Secondary | ICD-10-CM | POA: Diagnosis not present

## 2022-08-01 DIAGNOSIS — G2581 Restless legs syndrome: Secondary | ICD-10-CM | POA: Diagnosis not present

## 2022-08-01 DIAGNOSIS — E119 Type 2 diabetes mellitus without complications: Secondary | ICD-10-CM | POA: Diagnosis not present

## 2022-08-01 DIAGNOSIS — J449 Chronic obstructive pulmonary disease, unspecified: Secondary | ICD-10-CM | POA: Diagnosis not present

## 2022-08-01 DIAGNOSIS — I4891 Unspecified atrial fibrillation: Secondary | ICD-10-CM | POA: Diagnosis not present

## 2022-08-01 DIAGNOSIS — T8484XD Pain due to internal orthopedic prosthetic devices, implants and grafts, subsequent encounter: Secondary | ICD-10-CM | POA: Diagnosis not present

## 2022-08-01 DIAGNOSIS — I110 Hypertensive heart disease with heart failure: Secondary | ICD-10-CM | POA: Diagnosis not present

## 2022-08-02 ENCOUNTER — Telehealth: Payer: Self-pay | Admitting: Cardiology

## 2022-08-02 DIAGNOSIS — I5032 Chronic diastolic (congestive) heart failure: Secondary | ICD-10-CM | POA: Diagnosis not present

## 2022-08-02 DIAGNOSIS — J449 Chronic obstructive pulmonary disease, unspecified: Secondary | ICD-10-CM | POA: Diagnosis not present

## 2022-08-02 DIAGNOSIS — M25462 Effusion, left knee: Secondary | ICD-10-CM | POA: Diagnosis not present

## 2022-08-02 DIAGNOSIS — I4891 Unspecified atrial fibrillation: Secondary | ICD-10-CM | POA: Diagnosis not present

## 2022-08-02 DIAGNOSIS — T8484XD Pain due to internal orthopedic prosthetic devices, implants and grafts, subsequent encounter: Secondary | ICD-10-CM | POA: Diagnosis not present

## 2022-08-02 DIAGNOSIS — Z96652 Presence of left artificial knee joint: Secondary | ICD-10-CM | POA: Diagnosis not present

## 2022-08-02 DIAGNOSIS — I110 Hypertensive heart disease with heart failure: Secondary | ICD-10-CM | POA: Diagnosis not present

## 2022-08-02 DIAGNOSIS — G2581 Restless legs syndrome: Secondary | ICD-10-CM | POA: Diagnosis not present

## 2022-08-02 DIAGNOSIS — I2510 Atherosclerotic heart disease of native coronary artery without angina pectoris: Secondary | ICD-10-CM | POA: Diagnosis not present

## 2022-08-02 DIAGNOSIS — E119 Type 2 diabetes mellitus without complications: Secondary | ICD-10-CM | POA: Diagnosis not present

## 2022-08-02 NOTE — Telephone Encounter (Signed)
Spoke with Kathie Rhodes RN with [facility] who is out at home with patient reports HR 46 & BP 100/60. Reports HR 52 on 07/29/2022. Reports fall with no injury. Reports eating and drinking okay. O2 Sat 97 %. Denies swelling, dizziness, chest pain or SOB. Report weigh is 173 lbs. Medications reviewed. Advised to break imdur in 1/2 twice daily to equal 30 mg BID, continue monitoring symptoms, stay well hydrated and report to ED for worsening symptoms. Verbalized understanding of plan.

## 2022-08-02 NOTE — Telephone Encounter (Signed)
   STAT if HR is under 50 or over 120 (normal HR is 60-100 beats per minute)  What is your heart rate? Hr 46 bp 100/60 about 15 minutes ago   Do you have a log of your heart rate readings (document readings)?   Do you have any other symptoms? Pt did fall this morning but no injuries.

## 2022-08-03 ENCOUNTER — Ambulatory Visit: Payer: Medicare HMO | Attending: Cardiology

## 2022-08-03 ENCOUNTER — Encounter: Payer: Self-pay | Admitting: *Deleted

## 2022-08-03 ENCOUNTER — Ambulatory Visit: Payer: Self-pay | Admitting: *Deleted

## 2022-08-03 ENCOUNTER — Other Ambulatory Visit: Payer: Self-pay | Admitting: Cardiology

## 2022-08-03 DIAGNOSIS — R001 Bradycardia, unspecified: Secondary | ICD-10-CM

## 2022-08-03 MED ORDER — ISOSORBIDE MONONITRATE ER 60 MG PO TB24: 30.0000 mg | ORAL_TABLET | Freq: Two times a day (BID) | ORAL | 1 refills | Status: DC

## 2022-08-03 NOTE — Telephone Encounter (Signed)
Patient informed and verbalized understanding of plan. 

## 2022-08-03 NOTE — Patient Outreach (Signed)
Care Coordination   Follow Up Visit Note   08/03/2022 Name: Mike Wright MRN: [id number] DOB: 16-Oct-1933  Mike Wright is a 89 y.o. year old male who sees Mike Peri, MD for primary care. I spoke with  Mike Wright by phone today.  What matters to the patients health and wellness today?  Managing diarrhea, managing edema, managing microcytic anemia    Goals Addressed             This Visit's Progress    Manage Diarrhea       Care Coordination Goals: Patient will notify provider of any new or worsening symptoms and will seek emergency medical attention if needed Patient will continue to take Potassium supplement as prescribed Patient will drink water and electrolyte replacement, like sugar free gatorade, powerade, etc Patient will follow-up with cardiologist re: diarrhea as potential side effect of torsemide and concern re: electrolyte depletion with diarrhea and diuretic use Staff message sent to Dr Wyline Mood and his nurse, Mike Wright re: diarrhea since 07/28/22 after switching from furosemide to torsemide Patient will continue to check daily weights and monitor for increased edema Patient will keep appointment with cardiology on 08/15/22 and will rpt BMP as advised on hospital discharge Patient will reach out to RN Care Coordinator [phone] with any care coordination or resource needs     Manage Edema       Care Coordination Goals  Patient will continue monitor lower extremities and abdomen for swelling/edema and report any worsening to cardiologist Patient will take Torsemide as prescribed Patient will continue to monitor and record weights daily and will reach out to cardio with any weight gain of >2 lbs overnight of >5 lbs in one week Patient will prop legs up when at rest Patient will check and record blood pressure and reach out to cardio with any readings outside of recommended range Patient will check and record O2 sats and reach out to provider with any  readings outside of recommended range Patient will keep appt with cardiologist on 08/15/22 and reach out sooner if necessary Patient will reach out to RN Care Coordinator as needed [phone]          SDOH assessments and interventions completed:  Yes  SDOH Interventions Today    Flowsheet Row Most Recent Value  SDOH Interventions   Transportation Interventions Patient Resources (Friends/Family)  Physical Activity Interventions Other (Comments)  [unable to increase at this time]        Care Coordination Interventions:  Yes, provided  Interventions Today    Flowsheet Row Most Recent Value  Chronic Disease   Chronic disease during today's visit Congestive Heart Failure (CHF), Other  [Micrycytic Anemia]  General Interventions   General Interventions Discussed/Reviewed General Interventions Discussed, General Interventions Reviewed, Labs, Durable Medical Equipment (DME), Doctor Visits, Communication with  Labs Kidney Function  [BMP (electrolytes)]  Doctor Visits Discussed/Reviewed Doctor Visits Discussed, Doctor Visits Reviewed, Specialist  [reviewed recent cardio office note and telephone notes. Upcoming appt with cardio on 08/15/22. Rpt BMP at that time. He is not followed by hematology for microcytic anemia. Per patient, PCP does not manage.]  Durable Medical Equipment (DME) Oxygen, BP Cuff, Other  [scales, pulse ox. Daily weights are stable at 173 lbs]  PCP/Specialist Visits Compliance with follow-up visit  Communication with PCP/Specialists, RN  [Dr Branch & Mike Grippe, RN re: diarrhea 3-5 times a day since 07/28/22 after switch from furosemide to torsemide. Concern for electrolyte depletion with diarrhea and diuretic use]  Exercise Interventions   Exercise Discussed/Reviewed Physical Activity  Physical Activity Discussed/Reviewed Physical Activity Discussed, Physical Activity Reviewed  Education Interventions   Education Provided Provided Education  Provided Verbal  Education On Nutrition, Medication, When to see the doctor, Other  [continue to monitor daily weights and edema. Drink water and electrolyte replacement, sugar free sport drinks, while having diarrhea. Report new or worening symptoms. Seek emergency medical care if needed]  Nutrition Interventions   Nutrition Discussed/Reviewed Nutrition Discussed, Fluid intake  [eating iron rich roods. Has allergy to IV iron. Advised to start oral iron supplements at hosp discharge for microcytic anemia. hesitant to do this b/c of allergy to IV iron. Cardio notified.]  Pharmacy Interventions   Pharmacy Dicussed/Reviewed Pharmacy Topics Discussed, Pharmacy Topics Reviewed, Medications and their functions  Barron Alvine as a possible side effect of torsemide]  Safety Interventions   Safety Discussed/Reviewed Safety Discussed       Follow up plan: Follow up call scheduled for 08/10/22    Encounter Outcome:  Pt. Visit Completed   Demetrios Loll, BSN, RN-BC RN Care Coordinator [facility]  [facility] Direct Dial: [phone] Main #: [phone]

## 2022-08-03 NOTE — Patient Outreach (Signed)
Care Coordination   Follow Up Visit Note   07/28/2022 Name: Mike Wright MRN: [id number] DOB: 08-Sep-1933  Mike Wright is a 89 y.o. year old male who sees [name], MD for primary care. I spoke with  Mike Wright by phone today.  What matters to the patients health and wellness today?  Managing edema    Goals Addressed             This Visit's Progress    Manage Anemia   On track    Care Coordination Goals: Patient will follow-up with PCP as planned and sooner if needed Patient will monitor for signs of worsening anemia and notify PCP if he develops any Patient will seek emergency medical attention if needed Patient will notify provider of any obvious signs of bleeding Patient will take medications as prescribed Discharge summary recommended oral iron supplement since patient is allergic to IV iron. Patient has not started this and is reluctant Patient will continue to eat food rich in iron. He does tolerate these without a reaction. Patient will reach out to RN Care Coordinator [phone] with any care coordination or resource needs     Manage Edema   On track    Care Coordination Goals  Patient will monitor lower extremities and abdomen for swelling/edema and report any worsening to cardiologist Patient will take Torsemide as prescribed Patient will continue to monitor and record weights daily and will reach out to cardio with any weight gain of >2 lbs overnight of >5 lbs in one week Patient will prop legs up when at rest Patient will check and record blood pressure and reach out to cardio with any readings outside of recommended range Patient will check and record O2 sats and reach out to provider with any readings outside of recommended range Patient will keep appt with cardiologist in May and reach out sooner if necessary Patient will reach out to RN Care Coordinator as needed [phone]          SDOH assessments and interventions completed:  No      Care Coordination Interventions:  Yes, provided  Interventions Today    Flowsheet Row Most Recent Value  Chronic Disease   Chronic disease during today's visit Congestive Heart Failure (CHF)  General Interventions   General Interventions Discussed/Reviewed General Interventions Discussed, General Interventions Reviewed, Labs, Doctor Visits, Durable Medical Equipment (DME)  Labs Kidney Function  Doctor Visits Discussed/Reviewed Doctor Visits Discussed, Doctor Visits Reviewed, Specialist, PCP  Durable Medical Equipment (DME) BP Cuff, Oxygen, Other  [scales, pulse ox]  PCP/Specialist Visits Compliance with follow-up visit  Exercise Interventions   Exercise Discussed/Reviewed Physical Activity  Physical Activity Discussed/Reviewed Physical Activity Discussed, Physical Activity Reviewed  Education Interventions   Education Provided Provided Education  Provided Verbal Education On When to see the doctor, Medication  [check & record blood pressure, pulse, weight, and O2 daily & PRN. Notify cardio of any readings outside of recommended range. Edema Management. prop legs up when sitting.]  Nutrition Interventions   Nutrition Discussed/Reviewed Decreasing salt, Nutrition Discussed, Nutrition Reviewed  Pharmacy Interventions   Pharmacy Dicussed/Reviewed Medications and their functions, Pharmacy Topics Discussed, Pharmacy Topics Reviewed  Safety Interventions   Safety Discussed/Reviewed Safety Discussed, Safety Reviewed       Follow up plan: Follow up call scheduled for 08/03/22    Encounter Outcome:  Pt. Visit Completed   Mike Wright, BSN, RN-BC RN Care Coordinator [facility]  [facility] Direct Dial: [phone] Main #: [phone])  [phone]  

## 2022-08-03 NOTE — Telephone Encounter (Signed)
Per Dr. Evangeline Dakin have not seen HRs that low in clinic, can he get a 72 hr zio patch. Agree with lowering the imdur as you had mentioned, can he update [place] with bp's with that change

## 2022-08-04 DIAGNOSIS — T8484XD Pain due to internal orthopedic prosthetic devices, implants and grafts, subsequent encounter: Secondary | ICD-10-CM | POA: Diagnosis not present

## 2022-08-04 DIAGNOSIS — E119 Type 2 diabetes mellitus without complications: Secondary | ICD-10-CM | POA: Diagnosis not present

## 2022-08-04 DIAGNOSIS — J449 Chronic obstructive pulmonary disease, unspecified: Secondary | ICD-10-CM | POA: Diagnosis not present

## 2022-08-04 DIAGNOSIS — I2510 Atherosclerotic heart disease of native coronary artery without angina pectoris: Secondary | ICD-10-CM | POA: Diagnosis not present

## 2022-08-04 DIAGNOSIS — G2581 Restless legs syndrome: Secondary | ICD-10-CM | POA: Diagnosis not present

## 2022-08-04 DIAGNOSIS — M25462 Effusion, left knee: Secondary | ICD-10-CM | POA: Diagnosis not present

## 2022-08-04 DIAGNOSIS — I110 Hypertensive heart disease with heart failure: Secondary | ICD-10-CM | POA: Diagnosis not present

## 2022-08-04 DIAGNOSIS — I5032 Chronic diastolic (congestive) heart failure: Secondary | ICD-10-CM | POA: Diagnosis not present

## 2022-08-04 DIAGNOSIS — I4891 Unspecified atrial fibrillation: Secondary | ICD-10-CM | POA: Diagnosis not present

## 2022-08-06 DIAGNOSIS — R001 Bradycardia, unspecified: Secondary | ICD-10-CM | POA: Diagnosis not present

## 2022-08-09 DIAGNOSIS — R609 Edema, unspecified: Secondary | ICD-10-CM | POA: Diagnosis not present

## 2022-08-09 DIAGNOSIS — G2581 Restless legs syndrome: Secondary | ICD-10-CM | POA: Diagnosis not present

## 2022-08-09 DIAGNOSIS — E119 Type 2 diabetes mellitus without complications: Secondary | ICD-10-CM | POA: Diagnosis not present

## 2022-08-09 DIAGNOSIS — I2510 Atherosclerotic heart disease of native coronary artery without angina pectoris: Secondary | ICD-10-CM | POA: Diagnosis not present

## 2022-08-09 DIAGNOSIS — T8484XD Pain due to internal orthopedic prosthetic devices, implants and grafts, subsequent encounter: Secondary | ICD-10-CM | POA: Diagnosis not present

## 2022-08-09 DIAGNOSIS — I110 Hypertensive heart disease with heart failure: Secondary | ICD-10-CM | POA: Diagnosis not present

## 2022-08-09 DIAGNOSIS — I4891 Unspecified atrial fibrillation: Secondary | ICD-10-CM | POA: Diagnosis not present

## 2022-08-09 DIAGNOSIS — Z299 Encounter for prophylactic measures, unspecified: Secondary | ICD-10-CM | POA: Diagnosis not present

## 2022-08-09 DIAGNOSIS — I5032 Chronic diastolic (congestive) heart failure: Secondary | ICD-10-CM | POA: Diagnosis not present

## 2022-08-09 DIAGNOSIS — E1165 Type 2 diabetes mellitus with hyperglycemia: Secondary | ICD-10-CM | POA: Diagnosis not present

## 2022-08-09 DIAGNOSIS — M25462 Effusion, left knee: Secondary | ICD-10-CM | POA: Diagnosis not present

## 2022-08-09 DIAGNOSIS — J449 Chronic obstructive pulmonary disease, unspecified: Secondary | ICD-10-CM | POA: Diagnosis not present

## 2022-08-09 DIAGNOSIS — I350 Nonrheumatic aortic (valve) stenosis: Secondary | ICD-10-CM | POA: Diagnosis not present

## 2022-08-10 ENCOUNTER — Ambulatory Visit: Payer: Self-pay | Admitting: *Deleted

## 2022-08-11 ENCOUNTER — Encounter: Payer: Self-pay | Admitting: *Deleted

## 2022-08-11 DIAGNOSIS — T8484XD Pain due to internal orthopedic prosthetic devices, implants and grafts, subsequent encounter: Secondary | ICD-10-CM | POA: Diagnosis not present

## 2022-08-11 DIAGNOSIS — M25462 Effusion, left knee: Secondary | ICD-10-CM | POA: Diagnosis not present

## 2022-08-11 DIAGNOSIS — I5032 Chronic diastolic (congestive) heart failure: Secondary | ICD-10-CM | POA: Diagnosis not present

## 2022-08-11 DIAGNOSIS — J449 Chronic obstructive pulmonary disease, unspecified: Secondary | ICD-10-CM | POA: Diagnosis not present

## 2022-08-11 DIAGNOSIS — I4891 Unspecified atrial fibrillation: Secondary | ICD-10-CM | POA: Diagnosis not present

## 2022-08-11 DIAGNOSIS — I2510 Atherosclerotic heart disease of native coronary artery without angina pectoris: Secondary | ICD-10-CM | POA: Diagnosis not present

## 2022-08-11 DIAGNOSIS — G2581 Restless legs syndrome: Secondary | ICD-10-CM | POA: Diagnosis not present

## 2022-08-11 DIAGNOSIS — I110 Hypertensive heart disease with heart failure: Secondary | ICD-10-CM | POA: Diagnosis not present

## 2022-08-11 DIAGNOSIS — E119 Type 2 diabetes mellitus without complications: Secondary | ICD-10-CM | POA: Diagnosis not present

## 2022-08-12 NOTE — Patient Outreach (Signed)
Care Coordination   Follow Up Visit Note   08/10/22 Name: Mike Wright MRN: [id number] DOB: 03-21-1934  Mike Wright is a 89 y.o. year old male who sees [name], MD for primary care. I spoke with  Mike Wright by phone today.  What matters to the patients health and wellness today?  Managing edema, increasing mobility and balance    Goals Addressed             This Visit's Progress    Improve Balance and Mobility   On track    Care Coordination Goals: Patient will follow-up with PCP and specialists as planned and sooner if needed 08/02/22 Ortho with [facility] in [place] Patient will continue to practice fall precautions Patient will use rolling walker for ambulation Patient will notify provider of any falls Patient will call RN Care Coordinator with any care coordination or resource needs [phone]     Manage Anemia   On track    Care Coordination Goals: Patient will follow-up with PCP as planned and sooner if needed Patient will monitor for signs of worsening anemia and notify PCP if he develops any Patient will talk with PCP about a potential hematology referral for workup and management Patient will seek emergency medical attention if needed Patient will notify provider of any obvious signs of bleeding Patient will take medications as prescribed Discharge summary recommended oral iron supplement since patient is allergic to IV iron. Patient has not started this and is reluctant Patient will continue to eat food rich in iron. He does tolerate these without a reaction. Patient will reach out to [facility] Coordinator [phone] with any care coordination or resource needs     COMPLETED: Manage Diarrhea   On track    Care Coordination Goals: Diarrhea has resolved. Patient will notify provider of any new or worsening symptoms and will seek emergency medical attention if needed Patient will reach out to RN Care Coordinator [phone] with any care coordination or  resource needs       Manage Edema   On track    Care Coordination Goals  Patient will continue monitor lower extremities and abdomen for swelling/edema and report any worsening to cardiologist Patient will take Torsemide as prescribed Patient will continue to monitor and record weights daily and will reach out to cardio with any weight gain of >2 lbs overnight of >5 lbs in one week Patient will prop legs up when at rest Patient will check and record blood pressure and reach out to cardio with any readings outside of recommended range Patient will check and record O2 sats and reach out to provider with any readings outside of recommended range Patient will keep appt with cardiologist on 08/15/22 and reach out sooner if necessary Patient will reach out to RN Care Coordinator as needed [phone]          SDOH assessments and interventions completed:  No     Care Coordination Interventions:  Yes, provided  Interventions Today    Flowsheet Row Most Recent Value  Chronic Disease   Chronic disease during today's visit Congestive Heart Failure (CHF), Other  [microcytic anemia]  General Interventions   General Interventions Discussed/Reviewed General Interventions Discussed, Labs, General Interventions Reviewed, Durable Medical Equipment (DME), Doctor Visits, Communication with  Doctor Visits Discussed/Reviewed Doctor Visits Discussed, Specialist, Doctor Visits Reviewed, PCP  Durable Medical Equipment (DME) BP Cuff  [scales, pulse ox]  PCP/Specialist Visits Compliance with follow-up visit  Communication with PCP/Specialists  [patient was  advised by cardiology office to f/u with PCP re: diarrhea. Diarrhea has since resolved.]  Exercise Interventions   Exercise Discussed/Reviewed Physical Activity  Physical Activity Discussed/Reviewed Physical Activity Discussed, Physical Activity Reviewed  Education Interventions   Education Provided Provided Education  Provided Verbal Education On  Mental Health/Coping with Illness, When to see the doctor, Nutrition, Exercise, Medication, Other  [monitoring blood pressure and daily weights and for fluid retention in abd or lower extremities]  Nutrition Interventions   Nutrition Discussed/Reviewed Fluid intake, Nutrition Discussed, Nutrition Reviewed  Pharmacy Interventions   Pharmacy Dicussed/Reviewed Pharmacy Topics Discussed, Pharmacy Topics Reviewed, Medications and their functions  Safety Interventions   Safety Discussed/Reviewed Safety Discussed, Safety Reviewed, Fall Risk, Home Safety  Home Safety Assistive Devices       Follow up plan: Follow up call scheduled for 08/24/22    Encounter Outcome:  Pt. Visit Completed   Demetrios Loll, BSN, RN-BC RN Care Coordinator [facility]  [facility] Direct Dial: [phone] Main #: [phone]

## 2022-08-15 ENCOUNTER — Ambulatory Visit: Payer: Medicare HMO | Attending: Nurse Practitioner | Admitting: Nurse Practitioner

## 2022-08-15 ENCOUNTER — Encounter: Payer: Self-pay | Admitting: Nurse Practitioner

## 2022-08-15 VITALS — BP 130/56 | HR 46 | Ht 67.0 in | Wt 176.2 lb

## 2022-08-15 DIAGNOSIS — R5383 Other fatigue: Secondary | ICD-10-CM | POA: Diagnosis not present

## 2022-08-15 DIAGNOSIS — I5033 Acute on chronic diastolic (congestive) heart failure: Secondary | ICD-10-CM

## 2022-08-15 DIAGNOSIS — I5032 Chronic diastolic (congestive) heart failure: Secondary | ICD-10-CM | POA: Diagnosis not present

## 2022-08-15 DIAGNOSIS — I739 Peripheral vascular disease, unspecified: Secondary | ICD-10-CM

## 2022-08-15 DIAGNOSIS — Z89411 Acquired absence of right great toe: Secondary | ICD-10-CM | POA: Diagnosis not present

## 2022-08-15 DIAGNOSIS — Z79899 Other long term (current) drug therapy: Secondary | ICD-10-CM

## 2022-08-15 DIAGNOSIS — I2510 Atherosclerotic heart disease of native coronary artery without angina pectoris: Secondary | ICD-10-CM | POA: Diagnosis not present

## 2022-08-15 DIAGNOSIS — E785 Hyperlipidemia, unspecified: Secondary | ICD-10-CM

## 2022-08-15 DIAGNOSIS — I480 Paroxysmal atrial fibrillation: Secondary | ICD-10-CM

## 2022-08-15 DIAGNOSIS — R197 Diarrhea, unspecified: Secondary | ICD-10-CM

## 2022-08-15 DIAGNOSIS — R001 Bradycardia, unspecified: Secondary | ICD-10-CM

## 2022-08-15 DIAGNOSIS — I10 Essential (primary) hypertension: Secondary | ICD-10-CM

## 2022-08-15 DIAGNOSIS — Z89422 Acquired absence of other left toe(s): Secondary | ICD-10-CM | POA: Diagnosis not present

## 2022-08-15 DIAGNOSIS — I350 Nonrheumatic aortic (valve) stenosis: Secondary | ICD-10-CM | POA: Diagnosis not present

## 2022-08-15 DIAGNOSIS — I110 Hypertensive heart disease with heart failure: Secondary | ICD-10-CM | POA: Diagnosis not present

## 2022-08-15 DIAGNOSIS — Z89412 Acquired absence of left great toe: Secondary | ICD-10-CM | POA: Diagnosis not present

## 2022-08-15 DIAGNOSIS — I5030 Unspecified diastolic (congestive) heart failure: Secondary | ICD-10-CM | POA: Diagnosis not present

## 2022-08-15 DIAGNOSIS — Z89421 Acquired absence of other right toe(s): Secondary | ICD-10-CM | POA: Diagnosis not present

## 2022-08-15 DIAGNOSIS — I4891 Unspecified atrial fibrillation: Secondary | ICD-10-CM | POA: Diagnosis not present

## 2022-08-15 MED ORDER — TORSEMIDE 20 MG PO TABS: 20.0000 mg | ORAL_TABLET | Freq: Two times a day (BID) | ORAL | 1 refills | Status: DC

## 2022-08-15 MED ORDER — NITROGLYCERIN 0.4 MG SL SUBL: 0.4000 mg | SUBLINGUAL_TABLET | SUBLINGUAL | 3 refills | Status: DC | PRN

## 2022-08-15 NOTE — Patient Instructions (Addendum)
Medication Instructions:   Nitroglycerin, Torsemide refilled today  Continue all other medications.     Labwork:  CBC, BMET, Thyroid panel, Mg - orders given today Please do in 1 week Office will contact with results via phone, letter or mychart.     Testing/Procedures:  none  Follow-Up:  2 months   Any Other Special Instructions Will Be Listed Below (If Applicable).  Weight log Salty six  You have been referred to:  CHF clinic You have been referred to:  Structural Heart   If you need a refill on your cardiac medications before your next appointment, please call your pharmacy.

## 2022-08-15 NOTE — Progress Notes (Unsigned)
Office Visit    Patient Name: Mike Wright Date of Encounter: 08/15/2022  PCP:  [name], MD   [facility]  Cardiologist:  Dina Rich, MD  Advanced Practice Provider:  No care team member to display Electrophysiologist:  None  {Press F2 to show EP APP, CHF, sleep or structural heart MD               :[id number]}  { Click here to update then REFRESH NOTE - MD (PCP) or APP (Team Member)  Change PCP Type for MD, Specialty for APP is either Cardiology or Clinical Cardiac Electrophysiology  :[id number]}  Chief Complaint    DAJOUN Wright is a 89 y.o. male with a hx of CAD, HFpEF, history of A-fib, history of PSVT/palpitations, PAD (previous history of gangrene and amputations), hyperlipidemia, aortic valve stenosis, and chronic anemia, who presents today for scheduled follow-up.  Past Medical History    Past Medical History:  Diagnosis Date   Anemia    a. mild, noted 04/2017.   CAD in native artery    a. [place] 04/2017 s/p DES to D1, DES to prox-mid LAD, DES to prox LAD overlapping the prior stent, LVEF 55-65%.    Chronic diastolic CHF (congestive heart failure) (HCC)    Constipation    COPD (chronic obstructive pulmonary disease) (HCC)    Diabetic ulcer of toe (HCC)    DJD (degenerative joint disease) of cervical spine    Dysrhythmia    AFib   Essential hypertension    GERD (gastroesophageal reflux disease)    History of hiatal hernia    HIT (heparin-induced thrombocytopenia) (HCC)    Hypothyroidism    Hypoxia    a. went home on home O2 04/2017.   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   Retinitis    Sinus bradycardia    a. HR 30s-40s in 04/2017 -> diltiazem stopped, metoprolol reduced.   Sleep apnea    "chose not to order CPAP at this time"  (05/18/2017)   Type 2 diabetes mellitus (HCC)    Wears glasses    Wheezing    a. suspected COPD 04/2017. Former tobacco x 40 years.   Past Surgical History:  Procedure Laterality Date   ABDOMINAL AORTOGRAM W/LOWER EXTREMITY N/A 09/15/2017   Procedure: ABDOMINAL AORTOGRAM W/LOWER EXTREMITY;  Surgeon: Nada Libman, MD;  [facility];  Service: Cardiovascular;  Laterality: N/A;   ABDOMINAL AORTOGRAM W/LOWER EXTREMITY N/A 02/04/2020   Procedure: ABDOMINAL AORTOGRAM W/LOWER EXTREMITY;  Surgeon: Nada Libman, MD;  [facility]: [facility];  Service: Cardiovascular;  Laterality: N/A;   ABDOMINAL AORTOGRAM W/LOWER EXTREMITY N/A 12/15/2020   Procedure: ABDOMINAL AORTOGRAM W/LOWER EXTREMITY;  Surgeon: Nada Libman, MD;  [facility]: [facility];  Service: Cardiovascular;  Laterality: N/A;   AMPUTATION Bilateral 09/20/2017   Procedure: BILATERAL GREAT TOE AMPUTATIONS INCLUDING METATARSAL HEADS;  Surgeon: Nada Libman, MD;  Location: [facility] OR;  Service: Vascular;  Laterality: Bilateral;   AMPUTATION Right 03/01/2019   Procedure: AMPUTATION TOES;  Surgeon: Chuck Hint, MD;  Location[facility] OR;  Service: Vascular;  Laterality: Right;   AMPUTATION Right 05/29/2019   Procedure: AMPUTATION RIGHT THIRD TOE;  Surgeon: Nada Libman, MD;  Location: [facility] OR;  Service: Vascular;  Laterality: Right;   AMPUTATION Right 09/26/2019   Procedure: AMPUTATION RIGHT FOURTH TOE AND FIFTH TOES;  Surgeon: Nada Libman, MD;  Location: [facility] OR;  Service: Vascular;  Laterality: Right;   ANGIOPLASTY Right 09/20/2017   Procedure: ANGIOPLASTY RIGHT TIBIAL ARTERY;  Surgeon: Nada Libman, MD;  Location: [facility] OR;  Service: Vascular;  Laterality: Right;   APPENDECTOMY     APPLICATION OF WOUND VAC  12/17/2020   Procedure: APPLICATION OF WOUND VAC;  Surgeon: Nada Libman, MD;  [facility]: [facility] OR;  Service: Vascular;;   BACK SURGERY     CARDIAC CATHETERIZATION  1980s; 2012;   CATARACT EXTRACTION  Left 2004   COLONOSCOPY  2004   FLEISHMAN TICS   COLONOSCOPY WITH PROPOFOL N/A 11/28/2018   Procedure: COLONOSCOPY WITH PROPOFOL;  Surgeon: Malissa Hippo, MD;  [facility];  Service: Endoscopy;  Laterality: N/A;   CORONARY ANGIOPLASTY WITH STENT PLACEMENT  05/18/2017   "3 stents"   CORONARY STENT INTERVENTION N/A 05/18/2017   Procedure: CORONARY STENT INTERVENTION;  Surgeon: Corky Crafts, MD;  Location: [facility] INVASIVE CV LAB;  Service: Cardiovascular;  Laterality: N/A;   ESOPHAGOGASTRODUODENOSCOPY (EGD) WITH PROPOFOL N/A 11/20/2017   Procedure: ESOPHAGOGASTRODUODENOSCOPY (EGD) WITH PROPOFOL;  Surgeon: Hilarie Fredrickson, MD;  Location: [facility] ENDOSCOPY;  Service: Gastroenterology;  Laterality: N/A;   ESOPHAGOGASTRODUODENOSCOPY (EGD) WITH PROPOFOL N/A 11/28/2018   Procedure: ESOPHAGOGASTRODUODENOSCOPY (EGD) WITH PROPOFOL;  Surgeon: Malissa Hippo, MD;  [facility];  Service: Endoscopy;  Laterality: N/A;   ESOPHAGOGASTRODUODENOSCOPY (EGD) WITH PROPOFOL N/A 07/12/2019   Procedure: ESOPHAGOGASTRODUODENOSCOPY (EGD) WITH PROPOFOL;  Surgeon: Malissa Hippo, MD;  [facility];  Service: Endoscopy;  Laterality: N/A;  125   GIVENS CAPSULE STUDY N/A 12/17/2018   Procedure: GIVENS CAPSULE STUDY;  Surgeon: Malissa Hippo, MD;  [facility];  Service: Endoscopy;  Laterality: N/A;  7:30am   I & D EXTREMITY Right 12/17/2017   Procedure: IRRIGATION AND DEBRIDEMENT RIGHT GREAT TOE;  Surgeon: Nada Libman, MD;  [facility];  Service: Vascular;  Laterality: Right;   INCISION AND DRAINAGE Right 12/17/2020   Procedure: INCISION AND DRAINAGE OF RIGHT FOOT;  Surgeon: Nada Libman, MD;  [facility];  Service: Vascular;  Laterality: Right;   JOINT REPLACEMENT     LEFT HEART CATH AND CORONARY ANGIOGRAPHY N/A 05/18/2017   Procedure: LEFT HEART CATH AND CORONARY ANGIOGRAPHY;  Surgeon: Corky Crafts, MD;  Location: [facility] INVASIVE CV LAB;  Service: Cardiovascular;   Laterality: N/A;   LOWER EXTREMITY ANGIOGRAM Right 09/20/2017   Procedure: RIGHT LOWER LEG  ANGIOGRAM;  Surgeon: Nada Libman, MD;  [facility];  Service: Vascular;  Laterality: Right;   LUMBAR FUSION  2002   L3, 4 L4, 5 L5 S1 Fused by Dr. Trey Sailors   PERIPHERAL VASCULAR BALLOON ANGIOPLASTY Left 09/15/2017   Procedure: PERIPHERAL VASCULAR BALLOON ANGIOPLASTY;  Surgeon: Nada Libman, MD;  [facility];  Service: Cardiovascular;  Laterality: Left;  PTA of Peroneal & Posterior Tibial   PERIPHERAL VASCULAR INTERVENTION Right 12/15/2020   Procedure: PERIPHERAL VASCULAR INTERVENTION;  Surgeon: Nada Libman, MD;  [facility];  Service: Cardiovascular;  Laterality: Right;  SFA   POLYPECTOMY  11/28/2018   Procedure: POLYPECTOMY;  Surgeon: Malissa Hippo, MD;  [facility];  Service: Endoscopy;;  duodenum   POSTERIOR LUMBAR FUSION     RHINOPLASTY  RIGHT HEART CATH N/A 05/18/2017   Procedure: RIGHT HEART CATH;  Surgeon: Corky Crafts, MD;  Location: [facility] INVASIVE CV LAB;  Service: Cardiovascular;  Laterality: N/A;   TEE WITHOUT CARDIOVERSION N/A 09/18/2017   Procedure: TRANSESOPHAGEAL ECHOCARDIOGRAM (TEE);  Surgeon: Pricilla Riffle, MD;  Location: [facility] ENDOSCOPY;  Service: Cardiovascular;  Laterality: N/A;   TOTAL KNEE ARTHROPLASTY Bilateral    TRANSMETATARSAL AMPUTATION Right 02/04/2020   Procedure: RIGHT TRANSMETATARSAL AMPUTATION;  Surgeon: Nada Libman, MD;  Location: [facility] OR;  Service: Vascular;  Laterality: Right;   TRANSMETATARSAL AMPUTATION Left 11/12/2021   Procedure: LEFT TRANSMETATARSAL AMPUTATION;  Surgeon: Maeola Harman, MD;  Location: [facility] OR;  Service: Vascular;  Laterality: Left;   TRANSURETHRAL RESECTION OF PROSTATE  2001   [place]nan    Allergies  Allergies  Allergen Reactions   Codeine Shortness Of Breath   Doxycycline Swelling    Swelling and numbness in lips and face. Swelling improved after stopping. Reports  still experiences numbness in bottom lip.    Feraheme [Ferumoxytol] Other (See Comments)    Diaphoretic, chest pain   Heparin Other (See Comments)    +HIT,  Severe bleeding (with heparin drip & large doses), tolerates low doses   Iron Shortness Of Breath    Patient severe reaction to IV iron and does not tolerate oral formulations either   Losartan Swelling   Oxycodone Other (See Comments)    "Made me act out of my mind" Mental status changes- hallucinations   Latex Rash    History of Present Illness    MAVERIC PILTZ is a 89 y.o. male with a PMH as mentioned above.  Previous cardiovascular history includes nonobstructive CAD seen via cardiac catheterization 2012.  NST in 2017 was negative for ischemia.  Echocardiogram around that time revealed EF 65 to 70%.  Repeat Lexiscan in 2018 was low risk study.  Underwent cardiac catheterization in 2019, received drug-eluting stent to 75% stenosis D1, DES to 75% mid to distal LAD, DES to 70% proximal LAD.  Right heart cath showed findings listed below.  Was discharged on triple therapy for 1 month, then was instructed to stop aspirin.  Was admitted later that year for anemia and chest pain.  Workup was negative for ACS.  The following months, admitted with chest pain and dyspnea.  CT was negative for PE, ACS workup was negative.  Diuresed 6 L with resolution of symptoms.  Underwent NST in 2021 revealed small, mild intensity mid to basal inferior/inferoseptal defect, consistent with soft tissue attenuation, no significant ischemic territories.  Last seen by Dr. Dina Rich on July 18, 2022.  With found to be severely volume overloaded, weight was up 19 pounds since February.  Based on his symptoms, it was recommended he present to [facility], ED for further evaluation and for diuresis.  Admitted to [facility] for acute on chronic CHF exacerbation.  Did receive IV diuresis, transition to p.o. torsemide twice daily, also placed on potassium  supplementation.  Echocardiogram was updated-see report below.  Chest x-ray revealed probable mild interstitial edema.  Was referred to heart failure clinic outpatient.  Hospital course complicated by sundowning, encephalopathy, declining condition was discussed with family.  Outpatient follow-up with cardiology was arranged.  Today he presents for outpatient follow-up.  He states   He is doing much better since he left the hospital.  Weight is back down near to baseline.  Dry weight appears to be 172.  Does admit to having some diarrhea, unsure because  of this.  Denies any recent antibiotic usage or fever/chills/nausea or vomiting.  Chief concern is fatigue.  Denies any chest pain or other associated symptoms.  Manual heart rate on exam 56 bpm.  Does have history of bradycardia, monitor is pending.  Will obtain the following labs in 1 week: CBC, VMAT, magnesium and thyroid panel.  Will refer to heart failure clinic and refill nitroglycerin and torsemide.  Will also refer to Dr. Denyse Amass. Sharman Cheek with structural heart team for progressive aortic valve stenosis.  I will follow-up with him in 2 months.  EKGs/Labs/Other Studies Reviewed:   The following studies were reviewed today: ***  EKG:  EKG is *** ordered today.  The ekg ordered today demonstrates ***  Recent Labs: 07/25/2022: Hemoglobin 9.7; Platelets 103 07/27/2022: ALT 13; B Natriuretic Peptide 226.0; BUN 27; Creatinine, Ser 1.16; Magnesium 2.1; Potassium 3.6; Sodium 139  Recent Lipid Panel    Component Value Date/Time   CHOL 68 03/01/2019 0320   TRIG 114 03/01/2019 0320   HDL 16 (L) 03/01/2019 0320   CHOLHDL 4.3 03/01/2019 0320   VLDL 23 03/01/2019 0320   LDLCALC 29 03/01/2019 0320    Risk Assessment/Calculations:  {Does this patient have ATRIAL FIBRILLATION?[phone]}  Home Medications   No outpatient medications have been marked as taking for the 08/15/22 encounter (Appointment) with Sharlene Dory, NP.     Review of  Systems   ***   All other systems reviewed and are otherwise negative except as noted above.  Physical Exam    VS:  There were no vitals taken for this visit. , BMI There is no height or weight on file to calculate BMI.  Wt Readings from Last 3 Encounters:  07/27/22 178 lb 5.6 oz (80.9 kg)  07/18/22 203 lb 6.4 oz (92.3 kg)  05/16/22 184 lb 6.4 oz (83.6 kg)     GEN: Well nourished, well developed, in no acute distress. HEENT: normal. Neck: Supple, no JVD, carotid bruits, or masses. Cardiac: ***RRR, no murmurs, rubs, or gallops. No clubbing, cyanosis, edema.  ***Radials/PT 2+ and equal bilaterally.  Respiratory:  ***Respirations regular and unlabored, clear to auscultation bilaterally. GI: Soft, nontender, nondistended. MS: No deformity or atrophy. Skin: Warm and dry, no rash. Neuro:  Strength and sensation are intact. Psych: Normal affect.  Assessment & Plan    ***  {Are you ordering a CV Procedure (e.g. stress test, cath, DCCV, TEE, etc)?   Press F2        :[id number]}      Disposition: Follow up {follow up:15908} with Dina Rich, MD or APP.  Signed, Sharlene Dory, NP 08/15/2022, 12:27 PM [facility]

## 2022-08-16 DIAGNOSIS — I2510 Atherosclerotic heart disease of native coronary artery without angina pectoris: Secondary | ICD-10-CM | POA: Diagnosis not present

## 2022-08-16 DIAGNOSIS — G2581 Restless legs syndrome: Secondary | ICD-10-CM | POA: Diagnosis not present

## 2022-08-16 DIAGNOSIS — M25462 Effusion, left knee: Secondary | ICD-10-CM | POA: Diagnosis not present

## 2022-08-16 DIAGNOSIS — R001 Bradycardia, unspecified: Secondary | ICD-10-CM | POA: Diagnosis not present

## 2022-08-16 DIAGNOSIS — I110 Hypertensive heart disease with heart failure: Secondary | ICD-10-CM | POA: Diagnosis not present

## 2022-08-16 DIAGNOSIS — T8484XD Pain due to internal orthopedic prosthetic devices, implants and grafts, subsequent encounter: Secondary | ICD-10-CM | POA: Diagnosis not present

## 2022-08-16 DIAGNOSIS — I5032 Chronic diastolic (congestive) heart failure: Secondary | ICD-10-CM | POA: Diagnosis not present

## 2022-08-16 DIAGNOSIS — E119 Type 2 diabetes mellitus without complications: Secondary | ICD-10-CM | POA: Diagnosis not present

## 2022-08-16 DIAGNOSIS — I4891 Unspecified atrial fibrillation: Secondary | ICD-10-CM | POA: Diagnosis not present

## 2022-08-16 DIAGNOSIS — J449 Chronic obstructive pulmonary disease, unspecified: Secondary | ICD-10-CM | POA: Diagnosis not present

## 2022-08-17 ENCOUNTER — Telehealth: Payer: Self-pay | Admitting: Cardiology

## 2022-08-17 NOTE — Telephone Encounter (Signed)
[facility] calling with abnormal results on Zio monitor. Call transferred.

## 2022-08-17 NOTE — Telephone Encounter (Signed)
Received final result and sent to provider on 08/16/2022.

## 2022-08-19 DIAGNOSIS — J449 Chronic obstructive pulmonary disease, unspecified: Secondary | ICD-10-CM | POA: Diagnosis not present

## 2022-08-19 DIAGNOSIS — I4891 Unspecified atrial fibrillation: Secondary | ICD-10-CM | POA: Diagnosis not present

## 2022-08-19 DIAGNOSIS — I5032 Chronic diastolic (congestive) heart failure: Secondary | ICD-10-CM | POA: Diagnosis not present

## 2022-08-19 DIAGNOSIS — M25462 Effusion, left knee: Secondary | ICD-10-CM | POA: Diagnosis not present

## 2022-08-19 DIAGNOSIS — I110 Hypertensive heart disease with heart failure: Secondary | ICD-10-CM | POA: Diagnosis not present

## 2022-08-19 DIAGNOSIS — E119 Type 2 diabetes mellitus without complications: Secondary | ICD-10-CM | POA: Diagnosis not present

## 2022-08-19 DIAGNOSIS — I2510 Atherosclerotic heart disease of native coronary artery without angina pectoris: Secondary | ICD-10-CM | POA: Diagnosis not present

## 2022-08-19 DIAGNOSIS — T8484XD Pain due to internal orthopedic prosthetic devices, implants and grafts, subsequent encounter: Secondary | ICD-10-CM | POA: Diagnosis not present

## 2022-08-19 DIAGNOSIS — G2581 Restless legs syndrome: Secondary | ICD-10-CM | POA: Diagnosis not present

## 2022-08-23 ENCOUNTER — Other Ambulatory Visit (HOSPITAL_BASED_OUTPATIENT_CLINIC_OR_DEPARTMENT_OTHER): Payer: Self-pay | Admitting: Nurse Practitioner

## 2022-08-23 DIAGNOSIS — I10 Essential (primary) hypertension: Secondary | ICD-10-CM | POA: Diagnosis not present

## 2022-08-23 DIAGNOSIS — I480 Paroxysmal atrial fibrillation: Secondary | ICD-10-CM | POA: Diagnosis not present

## 2022-08-23 DIAGNOSIS — R001 Bradycardia, unspecified: Secondary | ICD-10-CM | POA: Diagnosis not present

## 2022-08-23 DIAGNOSIS — Z79899 Other long term (current) drug therapy: Secondary | ICD-10-CM | POA: Diagnosis not present

## 2022-08-23 DIAGNOSIS — I5033 Acute on chronic diastolic (congestive) heart failure: Secondary | ICD-10-CM | POA: Diagnosis not present

## 2022-08-24 ENCOUNTER — Encounter: Payer: Medicare HMO | Admitting: *Deleted

## 2022-08-24 ENCOUNTER — Telehealth: Payer: Self-pay | Admitting: *Deleted

## 2022-08-24 DIAGNOSIS — I739 Peripheral vascular disease, unspecified: Secondary | ICD-10-CM | POA: Diagnosis not present

## 2022-08-24 DIAGNOSIS — I700 Atherosclerosis of aorta: Secondary | ICD-10-CM | POA: Diagnosis not present

## 2022-08-24 DIAGNOSIS — I5032 Chronic diastolic (congestive) heart failure: Secondary | ICD-10-CM | POA: Diagnosis not present

## 2022-08-24 DIAGNOSIS — I10 Essential (primary) hypertension: Secondary | ICD-10-CM | POA: Diagnosis not present

## 2022-08-24 DIAGNOSIS — Z0000 Encounter for general adult medical examination without abnormal findings: Secondary | ICD-10-CM | POA: Diagnosis not present

## 2022-08-24 DIAGNOSIS — D696 Thrombocytopenia, unspecified: Secondary | ICD-10-CM

## 2022-08-24 DIAGNOSIS — Z299 Encounter for prophylactic measures, unspecified: Secondary | ICD-10-CM | POA: Diagnosis not present

## 2022-08-24 LAB — BASIC METABOLIC PANEL
BUN/Creatinine Ratio: 20 (ref 10–24)
BUN: 28 mg/dL — ABNORMAL HIGH (ref 8–27)
CO2: 23 mmol/L (ref 20–29)
Calcium: 8.9 mg/dL (ref 8.6–10.2)
Chloride: 101 mmol/L (ref 96–106)
Creatinine, Ser: 1.39 mg/dL — ABNORMAL HIGH (ref 0.76–1.27)
Glucose: 198 mg/dL — ABNORMAL HIGH (ref 70–99)
Potassium: 4.3 mmol/L (ref 3.5–5.2)
Sodium: 140 mmol/L (ref 134–144)
eGFR: 49 mL/min/{1.73_m2} — ABNORMAL LOW (ref >59)

## 2022-08-24 LAB — CBC
Hematocrit: 34.3 % — ABNORMAL LOW (ref 37.5–51.0)
Hemoglobin: 10.2 g/dL — ABNORMAL LOW (ref 13.0–17.7)
MCH: 22.8 pg — ABNORMAL LOW (ref 26.6–33.0)
MCHC: 29.7 g/dL — ABNORMAL LOW (ref 31.5–35.7)
MCV: 77 fL — ABNORMAL LOW (ref 79–97)
Platelets: 87 10*3/uL — CL (ref 150–450)
RBC: 4.48 x10E6/uL (ref 4.14–5.80)
RDW: 22.7 % — ABNORMAL HIGH (ref 11.6–15.4)
WBC: 6.1 10*3/uL (ref 3.4–10.8)

## 2022-08-24 LAB — THYROID PANEL WITH TSH: T3 Uptake Ratio: 31 % (ref 24–39)

## 2022-08-24 LAB — MAGNESIUM: Magnesium: 2.1 mg/dL (ref 1.6–2.3)

## 2022-08-24 NOTE — Telephone Encounter (Signed)
-----   Message from Sharlene Dory, NP sent at 08/24/2022  9:44 AM EDT ----- Critically low platelet counts, Hgb is stable. Stable kidney function. Chart reviewed. Doesn't appear that he sees hematology despite having a hx of HIT. Recommend referral to be seen by them for first available appt. He needs to notify us if he has any sudden/new onset bleeding.   Thanks!   Best,  Sharlene Dory, NP

## 2022-08-24 NOTE — Telephone Encounter (Signed)
Lesle Chris, LPN 1/61/0960  4:54 PM EDT Back to Top    Notified, copy to pcp.  He agrees to referral.

## 2022-08-25 LAB — THYROID PANEL WITH TSH: TSH: 5.22 u[IU]/mL — ABNORMAL HIGH (ref 0.450–4.500)

## 2022-08-29 ENCOUNTER — Encounter: Payer: Self-pay | Admitting: Hematology

## 2022-08-29 ENCOUNTER — Inpatient Hospital Stay: Payer: Medicare HMO

## 2022-08-29 ENCOUNTER — Inpatient Hospital Stay: Payer: Medicare HMO | Attending: Hematology | Admitting: Hematology

## 2022-08-29 VITALS — BP 129/71 | HR 61 | Temp 97.8°F | Resp 16 | Ht 67.0 in | Wt 176.9 lb

## 2022-08-29 DIAGNOSIS — Z808 Family history of malignant neoplasm of other organs or systems: Secondary | ICD-10-CM | POA: Insufficient documentation

## 2022-08-29 DIAGNOSIS — Z800 Family history of malignant neoplasm of digestive organs: Secondary | ICD-10-CM | POA: Diagnosis not present

## 2022-08-29 DIAGNOSIS — R760 Raised antibody titer: Secondary | ICD-10-CM | POA: Diagnosis not present

## 2022-08-29 DIAGNOSIS — D696 Thrombocytopenia, unspecified: Secondary | ICD-10-CM | POA: Diagnosis not present

## 2022-08-29 DIAGNOSIS — Z806 Family history of leukemia: Secondary | ICD-10-CM | POA: Diagnosis not present

## 2022-08-29 DIAGNOSIS — Z7901 Long term (current) use of anticoagulants: Secondary | ICD-10-CM | POA: Insufficient documentation

## 2022-08-29 DIAGNOSIS — Z8049 Family history of malignant neoplasm of other genital organs: Secondary | ICD-10-CM | POA: Diagnosis not present

## 2022-08-29 DIAGNOSIS — D509 Iron deficiency anemia, unspecified: Secondary | ICD-10-CM

## 2022-08-29 DIAGNOSIS — Z803 Family history of malignant neoplasm of breast: Secondary | ICD-10-CM | POA: Insufficient documentation

## 2022-08-29 DIAGNOSIS — R161 Splenomegaly, not elsewhere classified: Secondary | ICD-10-CM | POA: Diagnosis not present

## 2022-08-29 DIAGNOSIS — Z8042 Family history of malignant neoplasm of prostate: Secondary | ICD-10-CM | POA: Insufficient documentation

## 2022-08-29 DIAGNOSIS — Z7902 Long term (current) use of antithrombotics/antiplatelets: Secondary | ICD-10-CM | POA: Diagnosis not present

## 2022-08-29 DIAGNOSIS — R233 Spontaneous ecchymoses: Secondary | ICD-10-CM | POA: Insufficient documentation

## 2022-08-29 DIAGNOSIS — Z87891 Personal history of nicotine dependence: Secondary | ICD-10-CM | POA: Insufficient documentation

## 2022-08-29 DIAGNOSIS — Z8051 Family history of malignant neoplasm of kidney: Secondary | ICD-10-CM | POA: Diagnosis not present

## 2022-08-29 LAB — CBC WITH DIFFERENTIAL/PLATELET
Abs Immature Granulocytes: 0.05 10*3/uL (ref 0.00–0.07)
Basophils Absolute: 0 10*3/uL (ref 0.0–0.1)
Basophils Relative: 1 %
Eosinophils Absolute: 0.1 10*3/uL (ref 0.0–0.5)
Eosinophils Relative: 1 %
HCT: 34.9 % — ABNORMAL LOW (ref 39.0–52.0)
Hemoglobin: 10.7 g/dL — ABNORMAL LOW (ref 13.0–17.0)
Immature Granulocytes: 1 %
Lymphocytes Relative: 14 %
Lymphs Abs: 0.9 10*3/uL (ref 0.7–4.0)
MCH: 23.5 pg — ABNORMAL LOW (ref 26.0–34.0)
MCHC: 30.7 g/dL (ref 30.0–36.0)
MCV: 76.7 fL — ABNORMAL LOW (ref 80.0–100.0)
Monocytes Absolute: 1.2 10*3/uL — ABNORMAL HIGH (ref 0.1–1.0)
Monocytes Relative: 19 %
Neutro Abs: 4.3 10*3/uL (ref 1.7–7.7)
Neutrophils Relative %: 64 %
Platelets: 110 10*3/uL — ABNORMAL LOW (ref 150–400)
RBC: 4.55 MIL/uL (ref 4.22–5.81)
RDW: 26.5 % — ABNORMAL HIGH (ref 11.5–15.5)
WBC: 6.6 10*3/uL (ref 4.0–10.5)
nRBC: 0 % (ref 0.0–0.2)

## 2022-08-29 LAB — HEPATITIS B SURFACE ANTIGEN: Hepatitis B Surface Ag: NONREACTIVE

## 2022-08-29 LAB — IRON AND TIBC
Iron: 52 ug/dL (ref 45–182)
Saturation Ratios: 14 % — ABNORMAL LOW (ref 17.9–39.5)
TIBC: 378 ug/dL (ref 250–450)
UIBC: 326 ug/dL

## 2022-08-29 LAB — RETICULOCYTES
Immature Retic Fract: 17.4 % — ABNORMAL HIGH (ref 2.3–15.9)
RBC.: 4.55 MIL/uL (ref 4.22–5.81)
Retic Count, Absolute: 41.9 10*3/uL (ref 19.0–186.0)
Retic Ct Pct: 0.9 % (ref 0.4–3.1)

## 2022-08-29 LAB — FERRITIN: Ferritin: 16 ng/mL — ABNORMAL LOW (ref 24–336)

## 2022-08-29 LAB — FOLATE: Folate: >40 ng/mL (ref >5.9)

## 2022-08-29 LAB — HEPATITIS B SURFACE ANTIBODY,QUALITATIVE: Hep B S Ab: REACTIVE — AB

## 2022-08-29 LAB — LACTATE DEHYDROGENASE: LDH: 161 U/L (ref 98–192)

## 2022-08-29 LAB — VITAMIN B12: Vitamin B-12: 1156 pg/mL — ABNORMAL HIGH (ref 180–914)

## 2022-08-29 LAB — HEPATITIS B CORE ANTIBODY, TOTAL: Hep B Core Total Ab: REACTIVE — AB

## 2022-08-29 LAB — HEPATITIS C ANTIBODY: HCV Ab: NONREACTIVE

## 2022-08-29 NOTE — Patient Instructions (Addendum)
[facility]  Discharge Instructions  You were seen and examined today by Dr. Ellin Saba. Dr. Ellin Saba is a hematologist, meaning that he specializes in blood abnormalities. Dr. Ellin Saba discussed your past medical history, family history of cancers/blood conditions and the events that led to you being here today.  You were referred to Dr. Ellin Saba due to thrombocytopenia (low platelets). Platelets are a type of blood cell responsible for clotting. Your platelets have been low since April. You are also anemic.  Dr. Ellin Saba has recommended additional labs today for further evaluation. Dr. Ellin Saba will also order an ultrasound of your spleen because this can impact your platelet count.  Dr. Ellin Saba also recommends an iron infusion.  Follow-up as scheduled.  Thank you for choosing [facility] to provide your oncology and hematology care.   To afford each patient quality time with our provider, please arrive at least 15 minutes before your scheduled appointment time. You may need to reschedule your appointment if you arrive late (10 or more minutes). Arriving late affects you and other patients whose appointments are after yours.  Also, if you miss three or more appointments without notifying the office, you may be dismissed from the clinic at the provider's discretion.    Again, thank you for choosing [facility].  Our hope is that these requests will decrease the amount of time that you wait before being seen by our physicians.   If you have a lab appointment with the Cancer Center - please note that after April 8th, all labs will be drawn in the cancer center.  You do not have to check in or register with the main entrance as you have in the past but will complete your check-in at the cancer center.            _____________________________________________________________  Should you have questions after your visit  to [facility], please contact our office at [phone] and follow the prompts.  Our office hours are 8:00 a.m. to 4:30 p.m. Monday - Thursday and 8:00 a.m. to 2:30 p.m. Friday.  Please note that voicemails left after 4:00 p.m. may not be returned until the following business day.  We are closed weekends and all major holidays.  You do have access to a nurse 24-7, just call the main number to the clinic [phone] and do not press any options, hold on the line and a nurse will answer the phone.    For prescription refill requests, have your pharmacy contact our office and allow 72 hours.    Masks are no longer required in the cancer centers. If you would like for your care team to wear a mask while they are taking care of you, please let them know. You may have one support person who is at least 87 years old accompany you for your appointments.

## 2022-08-29 NOTE — Progress Notes (Signed)
[facility] [address], [place] [address]   Clinic Day:  08/29/2022  Referring physician: Sharlene Dory, NP  Patient Care Team: [name], MD as PCP - General (Internal Medicine) Wyline Mood Dorothe Pea, MD as PCP - Cardiology (Cardiology) [name], MD as Referring Physician (Internal Medicine) Gwenith Daily, RN as [facility] Care Management Doreatha Massed, MD as Medical Oncologist (Hematology)   ASSESSMENT & PLAN:   Assessment:  1.  Thrombocytopenia: - Patient seen at the request of Sharlene Dory, NP for thrombocytopenia. - CBC (08/23/2022): PLT-87, Hb-10.2, MCV-77.  WBC-6.1. - He is on Plavix and Eliquis 2.5 mg twice daily.  New medication was torsemide. - Denies any recent antibiotics.  No bleeding.  Easy bruising positive. - He had 2-3 episodes of black stools in the last 1 month.  He also takes Pepto-Bismol.  Does not take quinine compounds.  2.  Social/family history: - Lives at home with his wife.  Uses walker/cane for ambulation.  Independent of ADLs and was driving until August 9528.  He is a retired Dentist.  Quit smoking 30 years ago. - No family history of thrombocytopenia.  Mother had uterine cancer.  Maternal aunt had stomach cancer.  Another maternal aunt had kidney cancer and third maternal aunt had throat cancer.  Another maternal aunt had leukemia.  Maternal grandfather had prostate cancer.  Niece had breast cancer.  Plan:  1.  Moderate thrombocytopenia: - He had mild to moderate thrombocytopenia since April 2024.  Prior to that he had thrombocytopenia in 2019 which was transient, likely from antibiotic. - Recommend repeating platelet count today with further workup for nutritional deficiencies, connective tissue disorders, bone marrow infiltrative process. - Will also check ultrasound of the spleen to evaluate for splenomegaly. - RTC 2 to 3 weeks for follow-up.  2.  Severe iron deficiency anemia: - He  had reaction to Feraheme in 2021.  He was treated with steroid and H1/H2 blockers.  He was admitted to the hospital after reaction.  He did not require epinephrine. - His ferritin is low at 16 and hemoglobin 10.7 with MCV 76. - She cannot tolerate oral iron therapy. - I have recommended IV iron, a different preparation than Feraheme with premedication with steroids, H1/H2 blockers under close observation. - She is agreeable.  We will arrange it.   Orders Placed This Encounter  Procedures   US SPLEEN (ABDOMEN LIMITED)    Standing Status:   Future    Standing Expiration Date:   08/29/2023    Order Specific Question:   Reason for Exam (SYMPTOM  OR DIAGNOSIS REQUIRED)    Answer:   thrombocytopenia    Order Specific Question:   Preferred imaging location?    Answer:   [facility]    Order Specific Question:   Release to patient    Answer:   Immediate   CBC with Differential    Standing Status:   Future    Number of Occurrences:   1    Standing Expiration Date:   08/29/2023   Lactate dehydrogenase    Standing Status:   Future    Number of Occurrences:   1    Standing Expiration Date:   08/29/2023   Reticulocytes    Standing Status:   Future    Number of Occurrences:   1    Standing Expiration Date:   08/29/2023   Hepatitis B core antibody, total    Standing Status:   Future  Number of Occurrences:   1    Standing Expiration Date:   08/29/2023   Hepatitis B surface antigen    Standing Status:   Future    Number of Occurrences:   1    Standing Expiration Date:   08/29/2023   Hepatitis B surface antibody    Standing Status:   Future    Number of Occurrences:   1    Standing Expiration Date:   08/29/2023   Hepatitis C Antibody    Standing Status:   Future    Number of Occurrences:   1    Standing Expiration Date:   08/29/2023   Vitamin B12    Standing Status:   Future    Number of Occurrences:   1    Standing Expiration Date:   08/29/2023   Folate    Standing Status:   Future     Number of Occurrences:   1    Standing Expiration Date:   08/29/2023   Methylmalonic acid, serum    Standing Status:   Future    Number of Occurrences:   1    Standing Expiration Date:   08/29/2023   Copper, serum    Standing Status:   Future    Number of Occurrences:   1    Standing Expiration Date:   08/29/2023   Iron and TIBC ([facility] ONLY)    Standing Status:   Future    Number of Occurrences:   1    Standing Expiration Date:   08/29/2023   Ferritin    Standing Status:   Future    Number of Occurrences:   1    Standing Expiration Date:   08/29/2023   Protein electrophoresis, serum    Standing Status:   Future    Number of Occurrences:   1    Standing Expiration Date:   08/29/2023   Kappa/lambda light chains    Standing Status:   Future    Number of Occurrences:   1    Standing Expiration Date:   08/29/2023   Immunofixation electrophoresis    Standing Status:   Future    Number of Occurrences:   1    Standing Expiration Date:   08/29/2023   Haptoglobin    Standing Status:   Future    Number of Occurrences:   1    Standing Expiration Date:   08/29/2023   ANA, IFA (with reflex)    Standing Status:   Future    Number of Occurrences:   1    Standing Expiration Date:   08/29/2023   Rheumatoid factor    Standing Status:   Future    Number of Occurrences:   1    Standing Expiration Date:   08/29/2023      I,Katie Daubenspeck,acting as a scribe for Doreatha Massed, MD.,have documented all relevant documentation on the behalf of Doreatha Massed, MD,as directed by  Doreatha Massed, MD while in the presence of Doreatha Massed, MD.   I, Doreatha Massed MD, have reviewed the above documentation for accuracy and completeness, and I agree with the above.   Doreatha Massed, MD   6/3/20242:42 PM  CHIEF COMPLAINT/PURPOSE OF CONSULT:   Diagnosis: Pancytopenia   Current Therapy: INFeD x 1  HISTORY OF PRESENT ILLNESS:   Mike Wright is a 89 y.o. male  presenting to clinic today for evaluation of pancytopenia at the request of Sharlene Dory, NP. He was previously seen by Dr. Candise Che at [facility]  in 12/2017 for pancytopenia. He was also referred back in 12/2019 and scheduled to see Dr. Al Pimple, but he did not show for his appointment.  His most recent CBC from 08/23/22 showed: Hgb 10.2, HCT 34.3, RDW 22.7%, platelets 87k.  Today, he states that he is doing well overall. His appetite level is at 100%. His energy level is at 10%.  PAST MEDICAL HISTORY:   Past Medical History: Past Medical History:  Diagnosis Date   Anemia    a. mild, noted 04/2017.   CAD in native artery    a. [place] 04/2017 s/p DES to D1, DES to prox-mid LAD, DES to prox LAD overlapping the prior stent, LVEF 55-65%.    Chronic diastolic CHF (congestive heart failure) (HCC)    Constipation    COPD (chronic obstructive pulmonary disease) (HCC)    Diabetic ulcer of toe (HCC)    DJD (degenerative joint disease) of cervical spine    Dysrhythmia    AFib   Essential hypertension    GERD (gastroesophageal reflux disease)    History of hiatal hernia    HIT (heparin-induced thrombocytopenia) (HCC)    Hypothyroidism    Hypoxia    a. went home on home O2 04/2017.   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   Retinitis    Sinus bradycardia    a. HR 30s-40s in 04/2017 -> diltiazem stopped, metoprolol reduced.   Sleep apnea    "chose not to order CPAP at this time" (05/18/2017)   Type 2 diabetes mellitus (HCC)    Wears glasses    Wheezing    a. suspected COPD 04/2017. Former tobacco x 40 years.    Surgical History: Past Surgical History:  Procedure Laterality Date   ABDOMINAL AORTOGRAM W/LOWER EXTREMITY N/A 09/15/2017   Procedure: ABDOMINAL AORTOGRAM W/LOWER EXTREMITY;  Surgeon: Nada Libman,  MD;  [facility]: [facility];  Service: Cardiovascular;  Laterality: N/A;   ABDOMINAL AORTOGRAM W/LOWER EXTREMITY N/A 02/04/2020   Procedure: ABDOMINAL AORTOGRAM W/LOWER EXTREMITY;  Surgeon: Nada Libman, MD;  Location: [facility];  Service: Cardiovascular;  Laterality: N/A;   ABDOMINAL AORTOGRAM W/LOWER EXTREMITY N/A 12/15/2020   Procedure: ABDOMINAL AORTOGRAM W/LOWER EXTREMITY;  Surgeon: Nada Libman, MD;  Location: [facility];  Service: Cardiovascular;  Laterality: N/A;   AMPUTATION Bilateral 09/20/2017   Procedure: BILATERAL GREAT TOE AMPUTATIONS INCLUDING METATARSAL HEADS;  Surgeon: Nada Libman, MD;  Location: [facility] OR;  Service: Vascular;  Laterality: Bilateral;   AMPUTATION Right 03/01/2019   Procedure: AMPUTATION TOES;  Surgeon: Chuck Hint, MD;  Location[facility] OR;  Service: Vascular;  Laterality: Right;   AMPUTATION Right 05/29/2019   Procedure: AMPUTATION RIGHT THIRD TOE;  Surgeon: Nada Libman, MD;  Location: [facility] OR;  Service: Vascular;  Laterality: Right;   AMPUTATION Right 09/26/2019   Procedure: AMPUTATION RIGHT FOURTH TOE AND FIFTH TOES;  Surgeon: Nada Libman, MD;  Location: [facility] OR;  Service: Vascular;  Laterality: Right;   ANGIOPLASTY Right 09/20/2017   Procedure: ANGIOPLASTY RIGHT TIBIAL ARTERY;  Surgeon: Nada Libman, MD;  Location: [facility] OR;  Service: Vascular;  Laterality: Right;   APPENDECTOMY     APPLICATION OF WOUND VAC  12/17/2020   Procedure: APPLICATION OF WOUND VAC;  Surgeon: Nada Libman, MD;  Location: [facility] OR;  Service: Vascular;;   BACK SURGERY     CARDIAC CATHETERIZATION  1980s; 2012;   CATARACT EXTRACTION Left 2004   COLONOSCOPY  2004   FLEISHMAN TICS   COLONOSCOPY WITH PROPOFOL N/A 11/28/2018   Procedure: COLONOSCOPY WITH PROPOFOL;  Surgeon: Malissa Hippo, MD;  [facility];  Service: Endoscopy;  Laterality: N/A;   CORONARY ANGIOPLASTY WITH STENT PLACEMENT  05/18/2017   "3 stents"   CORONARY STENT  INTERVENTION N/A 05/18/2017   Procedure: CORONARY STENT INTERVENTION;  Surgeon: Corky Crafts, MD;  Location: [facility] INVASIVE CV LAB;  Service: Cardiovascular;  Laterality: N/A;   ESOPHAGOGASTRODUODENOSCOPY (EGD) WITH PROPOFOL N/A 11/20/2017   Procedure: ESOPHAGOGASTRODUODENOSCOPY (EGD) WITH PROPOFOL;  Surgeon: Hilarie Fredrickson, MD;  Location: [facility] ENDOSCOPY;  Service: Gastroenterology;  Laterality: N/A;   ESOPHAGOGASTRODUODENOSCOPY (EGD) WITH PROPOFOL N/A 11/28/2018   Procedure: ESOPHAGOGASTRODUODENOSCOPY (EGD) WITH PROPOFOL;  Surgeon: Malissa Hippo, MD;  [facility];  Service: Endoscopy;  Laterality: N/A;   ESOPHAGOGASTRODUODENOSCOPY (EGD) WITH PROPOFOL N/A 07/12/2019   Procedure: ESOPHAGOGASTRODUODENOSCOPY (EGD) WITH PROPOFOL;  Surgeon: Malissa Hippo, MD;  [facility];  Service: Endoscopy;  Laterality: N/A;  125   GIVENS CAPSULE STUDY N/A 12/17/2018   Procedure: GIVENS CAPSULE STUDY;  Surgeon: Malissa Hippo, MD;  [facility];  Service: Endoscopy;  Laterality: N/A;  7:30am   I & D EXTREMITY Right 12/17/2017   Procedure: IRRIGATION AND DEBRIDEMENT RIGHT GREAT TOE;  Surgeon: Nada Libman, MD;  Location: [facility];  Service: Vascular;  Laterality: Right;   INCISION AND DRAINAGE Right 12/17/2020   Procedure: INCISION AND DRAINAGE OF RIGHT FOOT;  Surgeon: Nada Libman, MD;  Location: [facility];  Service: Vascular;  Laterality: Right;   JOINT REPLACEMENT     LEFT HEART CATH AND CORONARY ANGIOGRAPHY N/A 05/18/2017   Procedure: LEFT HEART CATH AND CORONARY ANGIOGRAPHY;  Surgeon: Corky Crafts, MD;  Location: [facility] INVASIVE CV LAB;  Service: Cardiovascular;  Laterality: N/A;   LOWER EXTREMITY ANGIOGRAM Right 09/20/2017   Procedure: RIGHT LOWER LEG  ANGIOGRAM;  Surgeon: Nada Libman, MD;  Location: [facility] OR;  Service: Vascular;  Laterality: Right;   LUMBAR FUSION  2002   L3, 4 L4, 5 L5 S1 Fused by Dr. Trey Sailors   PERIPHERAL VASCULAR BALLOON ANGIOPLASTY Left 09/15/2017    Procedure: PERIPHERAL VASCULAR BALLOON ANGIOPLASTY;  Surgeon: Nada Libman, MD;  [facility];  Service: Cardiovascular;  Laterality: Left;  PTA of Peroneal & Posterior Tibial   PERIPHERAL VASCULAR INTERVENTION Right 12/15/2020   Procedure: PERIPHERAL VASCULAR INTERVENTION;  Surgeon: Nada Libman, MD;  [facility];  Service: Cardiovascular;  Laterality: Right;  SFA   POLYPECTOMY  11/28/2018   Procedure: POLYPECTOMY;  Surgeon: Malissa Hippo, MD;  [facility];  Service: Endoscopy;;  duodenum   POSTERIOR LUMBAR FUSION     RHINOPLASTY     RIGHT HEART CATH N/A 05/18/2017   Procedure: RIGHT HEART CATH;  Surgeon: Corky Crafts, MD;  Location: [facility] INVASIVE CV LAB;  Service: Cardiovascular;  Laterality: N/A;   TEE WITHOUT CARDIOVERSION N/A 09/18/2017   Procedure: TRANSESOPHAGEAL ECHOCARDIOGRAM (TEE);  Surgeon: Pricilla Riffle, MD;  Location: [facility] ENDOSCOPY;  Service: Cardiovascular;  Laterality: N/A;   TOTAL KNEE ARTHROPLASTY Bilateral    TRANSMETATARSAL AMPUTATION Right 02/04/2020   Procedure: RIGHT TRANSMETATARSAL AMPUTATION;  Surgeon: Nada Libman, MD;  Location:  [facility] OR;  Service: Vascular;  Laterality: Right;   TRANSMETATARSAL AMPUTATION Left 11/12/2021   Procedure: LEFT TRANSMETATARSAL AMPUTATION;  Surgeon: Maeola Harman, MD;  Location: [facility] OR;  Service: Vascular;  Laterality: Left;   TRANSURETHRAL RESECTION OF PROSTATE  2001   Rito Ehrlich    Social History: Social History   Socioeconomic History   Marital status: Married    Spouse name: patsy   Number of children: Not on file   Years of education: Not on file   Highest education level: Not on file  Occupational History   Not on file  Tobacco Use   Smoking status: Former    Packs/day: 2.00    Years: 30.00    Additional pack years: 0.00    Total pack years: 60.00    Types: Cigarettes    Start date: 03/28/1953    Quit date: 03/29/1983    Years since quitting: 39.4    Smokeless tobacco: Never  Vaping Use   Vaping Use: Never used  Substance and Sexual Activity   Alcohol use: Not Currently    Comment: 05/18/2017 'I drink a beer q yr"   Drug use: No   Sexual activity: Not Currently  Other Topics Concern   Not on file  Social History Narrative   Patient lives at home with his spouse.    Social Determinants of Health   Financial Resource Strain: Low Risk  (05/23/2022)   Overall Financial Resource Strain (CARDIA)    Difficulty of Paying Living Expenses: Not hard at all  Food Insecurity: No Food Insecurity (08/29/2022)   Hunger Vital Sign    Worried About Running Out of Food in the Last Year: Never true    Ran Out of Food in the Last Year: Never true  Transportation Needs: No Transportation Needs (08/29/2022)   PRAPARE - Administrator, Civil Service (Medical): No    Lack of Transportation (Non-Medical): No  Physical Activity: Inactive (08/03/2022)   Exercise Vital Sign    Days of Exercise per Week: 0 days    Minutes of Exercise per Session: 0 min  Stress: No Stress Concern Present (07/29/2019)   [employer] of Occupational Health - Occupational Stress Questionnaire    Feeling of Stress : Not at all  Social Connections: Not on file  Intimate Partner Violence: Not At Risk (08/29/2022)   Humiliation, Afraid, Rape, and Kick questionnaire    Fear of Current or Ex-Partner: No    Emotionally Abused: No    Physically Abused: No    Sexually Abused: No    Family History: Family History  Problem Relation Age of Onset   Heart attack Father 63   COPD Father    COPD Mother    Heart disease Mother    Diabetes Mother    Hypertension Sister    CVA Sister    Diabetes Sister    Multiple sclerosis Sister     Current Medications:  Current Outpatient Medications:    Accu-Chek Softclix Lancets lancets, , Disp: , Rfl:    acetaminophen (TYLENOL) 500 MG tablet, Take 1 tablet (500 mg total) by mouth every 6 (six) hours as needed for mild pain or  headache. (Patient taking differently: Take 1,000 mg by mouth at bedtime.), Disp: 30 tablet, Rfl: 0   Alcohol Swabs (B-D SINGLE USE SWABS REGULAR) PADS, , Disp: , Rfl:    amLODipine (NORVASC) 2.5 MG tablet, Take 2.5 mg by mouth daily., Disp: , Rfl:    apixaban (ELIQUIS) 2.5 MG TABS  tablet, Take 1 tablet (2.5 mg total) by mouth 2 (two) times daily. (Patient taking differently: Take 5 mg by mouth 2 (two) times daily.), Disp: 60 tablet, Rfl: 11   atorvastatin (LIPITOR) 80 MG tablet, Take 80 mg by mouth every evening. , Disp: , Rfl:    Blood Glucose Monitoring Suppl (ACCU-CHEK GUIDE) w/Device KIT, , Disp: , Rfl:    Cholecalciferol (VITAMIN D3) 50 MCG (2000 UT) TABS, Take 2,000 Units by mouth 2 (two) times daily., Disp: , Rfl:    clopidogrel (PLAVIX) 75 MG tablet, Take 1 tablet (75 mg total) by mouth daily with breakfast., Disp: 30 tablet, Rfl: 2   Cyanocobalamin (B-12) 2500 MCG TABS, Take 2,500 mcg by mouth daily., Disp: , Rfl:    diazepam (VALIUM) 2 MG tablet, Take 2 mg by mouth daily., Disp: , Rfl:    DROPLET INSULIN SYRINGE 31G X 5/16" 1 ML MISC, , Disp: , Rfl:    folic acid (FOLVITE) 1 MG tablet, Take 1 tablet (1 mg total) by mouth daily., Disp: 30 tablet, Rfl: 1   insulin glargine (LANTUS) 100 UNIT/ML injection, Inject 30 Units into the skin 2 (two) times daily as needed., Disp: , Rfl:    ipratropium-albuterol (DUONEB) 0.5-2.5 (3) MG/3ML SOLN, Take 3 mLs by nebulization every other day., Disp: , Rfl:    isosorbide mononitrate (IMDUR) 60 MG 24 hr tablet, Take 0.5 tablets (30 mg total) by mouth 2 (two) times daily. (Patient taking differently: Take 60 mg by mouth 2 (two) times daily.), Disp: 90 tablet, Rfl: 1   nitroGLYCERIN (NITROSTAT) 0.4 MG SL tablet, Place 1 tablet (0.4 mg total) under the tongue every 5 (five) minutes as needed for chest pain., Disp: 25 tablet, Rfl: 3   OXYGEN, Inhale 2 L/min into the lungs See admin instructions. 2 L/min of oxygen at bedtime and during the day as needed for  shortness of breath, Disp: , Rfl:    pantoprazole (PROTONIX) 40 MG tablet, Take 1 tablet (40 mg total) by mouth 2 (two) times daily. To protect stomach while taking multiple blood thinners. (Patient taking differently: Take 40 mg by mouth daily. To protect stomach while taking multiple blood thinners.), Disp: 60 tablet, Rfl: 2   potassium chloride SA (KLOR-CON M) 20 MEQ tablet, Take 1 tablet (20 mEq total) by mouth 2 (two) times daily., Disp: 30 tablet, Rfl: 1   SPIRIVA HANDIHALER 18 MCG inhalation capsule, Place 1 capsule into inhaler and inhale daily after breakfast. , Disp: , Rfl:    torsemide (DEMADEX) 20 MG tablet, Take 1 tablet (20 mg total) by mouth 2 (two) times daily., Disp: 180 tablet, Rfl: 1   traZODone (DESYREL) 100 MG tablet, Take 200 mg by mouth at bedtime., Disp: , Rfl:    TRUE METRIX BLOOD GLUCOSE TEST test strip, , Disp: , Rfl:    Allergies: Allergies  Allergen Reactions   Codeine Shortness Of Breath   Doxycycline Swelling    Swelling and numbness in lips and face. Swelling improved after stopping. Reports still experiences numbness in bottom lip.    Feraheme [Ferumoxytol] Other (See Comments)    Diaphoretic, chest pain   Heparin Other (See Comments)    +HIT,  Severe bleeding (with heparin drip & large doses), tolerates low doses   Iron Shortness Of Breath    Patient severe reaction to IV iron and does not tolerate oral formulations either   Losartan Swelling   Oxycodone Other (See Comments)    "Made me act out of my mind"  Mental status changes- hallucinations   Latex Rash    REVIEW OF SYSTEMS:   Review of Systems  Constitutional:  Negative for chills, fatigue and fever.  HENT:   Negative for lump/mass, mouth sores, nosebleeds, sore throat and trouble swallowing.   Eyes:  Negative for eye problems.  Respiratory:  Negative for cough and shortness of breath.   Cardiovascular:  Negative for chest pain, leg swelling and palpitations.  Gastrointestinal:  Negative for  abdominal pain, constipation, diarrhea, nausea and vomiting.  Genitourinary:  Negative for bladder incontinence, difficulty urinating, dysuria, frequency, hematuria and nocturia.   Musculoskeletal:  Negative for arthralgias, back pain, flank pain, myalgias and neck pain.  Skin:  Negative for itching and rash.  Neurological:  Negative for dizziness, headaches and numbness.  Hematological:  Does not bruise/bleed easily.  Psychiatric/Behavioral:  Negative for depression, sleep disturbance and suicidal ideas. The patient is not nervous/anxious.   All other systems reviewed and are negative.    VITALS:   Blood pressure 129/71, pulse 61, temperature 97.8 F (36.6 C), temperature source Oral, resp. rate 16, height 5\' 7"  (1.702 m), weight 176 lb 14.4 oz (80.2 kg), SpO2 99 %.  Wt Readings from Last 3 Encounters:  08/29/22 176 lb 14.4 oz (80.2 kg)  08/15/22 176 lb 3.2 oz (79.9 kg)  07/27/22 178 lb 5.6 oz (80.9 kg)    Body mass index is 27.71 kg/m.   PHYSICAL EXAM:   Physical Exam Vitals and nursing note reviewed. Exam conducted with a chaperone present.  Constitutional:      Appearance: Normal appearance.  Cardiovascular:     Rate and Rhythm: Normal rate and regular rhythm.     Pulses: Normal pulses.     Heart sounds: Normal heart sounds.  Pulmonary:     Effort: Pulmonary effort is normal.     Breath sounds: Normal breath sounds.  Abdominal:     Palpations: Abdomen is soft. There is no hepatomegaly, splenomegaly or mass.     Tenderness: There is no abdominal tenderness.  Musculoskeletal:     Right lower leg: No edema.     Left lower leg: No edema.  Lymphadenopathy:     Cervical: No cervical adenopathy.     Right cervical: No superficial, deep or posterior cervical adenopathy.    Left cervical: No superficial, deep or posterior cervical adenopathy.     Upper Body:     Right upper body: No supraclavicular or axillary adenopathy.     Left upper body: No supraclavicular or axillary  adenopathy.  Neurological:     General: No focal deficit present.     Mental Status: He is alert and oriented to person, place, and time.  Psychiatric:        Mood and Affect: Mood normal.        Behavior: Behavior normal.    LABS:      Latest Ref Rng & Units 08/29/2022    1:45 PM 08/23/2022   10:29 AM 07/25/2022    4:27 AM  CBC  WBC 4.0 - 10.5 K/uL 6.6  6.1  6.2   Hemoglobin 13.0 - 17.0 g/dL 16.1  09.6  9.7   Hematocrit 39.0 - 52.0 % 34.9  34.3  32.2   Platelets 150 - 400 K/uL 110  87  103       Latest Ref Rng & Units 08/23/2022   10:29 AM 07/27/2022    3:38 AM 07/26/2022    4:18 AM  CMP  Glucose 70 - 99 mg/dL  198  126  183   BUN 8 - 27 mg/dL 28  27  34   Creatinine 0.76 - 1.27 mg/dL 0.98  1.19  1.47   Sodium 134 - 144 mmol/L 140  139  136   Potassium 3.5 - 5.2 mmol/L 4.3  3.6  3.3   Chloride 96 - 106 mmol/L 101  99  97   CO2 20 - 29 mmol/L 23  31  29    Calcium 8.6 - 10.2 mg/dL 8.9  8.4  8.2   Total Protein 6.5 - 8.1 g/dL  6.2  6.1   Total Bilirubin 0.3 - 1.2 mg/dL  1.2  1.4   Alkaline Phos 38 - 126 U/L  80  75   AST 15 - 41 U/L  21  19   ALT 0 - 44 U/L  13  11      No results found for: "CEA1", "CEA" / No results found for: "CEA1", "CEA" No results found for: "PSA1" No results found for: "WGN562" No results found for: "CAN125"  No results found for: "TOTALPROTELP", "ALBUMINELP", "A1GS", "A2GS", "BETS", "BETA2SER", "GAMS", "MSPIKE", "SPEI" Lab Results  Component Value Date   TIBC 350 07/19/2022   TIBC 407 11/27/2018   TIBC 268 01/04/2018   FERRITIN 9 (L) 07/19/2022   FERRITIN 11 (L) 11/27/2018   FERRITIN 52 01/04/2018   IRONPCTSAT 3 (L) 07/19/2022   IRONPCTSAT 6 (L) 11/27/2018   IRONPCTSAT 17 (L) 01/04/2018   Lab Results  Component Value Date   LDH 177 01/04/2018     STUDIES:   No results found.

## 2022-08-30 ENCOUNTER — Encounter: Payer: Self-pay | Admitting: *Deleted

## 2022-08-30 ENCOUNTER — Ambulatory Visit: Payer: Self-pay | Admitting: *Deleted

## 2022-08-30 LAB — KAPPA/LAMBDA LIGHT CHAINS
Kappa free light chain: 60 mg/L — ABNORMAL HIGH (ref 3.3–19.4)
Kappa, lambda light chain ratio: 1.17 (ref 0.26–1.65)
Lambda free light chains: 51.5 mg/L — ABNORMAL HIGH (ref 5.7–26.3)

## 2022-08-30 NOTE — Patient Outreach (Signed)
  Care Coordination   Follow Up Visit Note   08/30/2022 Name: Mike Wright MRN: [id number] DOB: 06-15-1933  Mike Wright is a 89 y.o. year old male who sees [name], MD for primary care. I spoke with  [name] by phone today.  What matters to the patients health and wellness today?  Managing anemia and finding out why iron is low   Goals Addressed             This Visit's Progress    Manage Anemia   On track    Care Coordination Goals: Patient will follow-up with PCP and hematologist as planned and sooner if needed Appt for iron infusion scheduled for 09/08/22. Hematologist is aware of prior allergic reaction and has ordered a different iron preparation. Patient will also be premedicated with steroids and H1/H2 blockers Abd Ultrasound scheduled for 09/12/22 Patient will monitor for signs of worsening anemia and notify hematologist if he develops any Patient will seek emergency medical attention if needed Patient will notify provider of any obvious signs of bleeding Patient will take medications as prescribed Patient will continue to eat food rich in iron. He does tolerate these without a reaction. Patient will reach out to RN Care Coordinator [phone] with any care coordination or resource needs        SDOH assessments and interventions completed:  Yes  SDOH Interventions Today    Flowsheet Row Most Recent Value  SDOH Interventions   Housing Interventions Intervention Not Indicated  Transportation Interventions Intervention Not Indicated  Financial Strain Interventions Intervention Not Indicated        Care Coordination Interventions:  Yes, provided  Interventions Today    Flowsheet Row Most Recent Value  Chronic Disease   Chronic disease during today's visit Other  [Iron Deficiency Anemia]  General Interventions   General Interventions Discussed/Reviewed General Interventions Discussed, Labs, Doctor Visits, General Interventions Reviewed  Labs  --  [reviewed recent hematology labs including hgb, which was stable, and iron saturation and platelet count, which were low]  Doctor Visits Discussed/Reviewed Doctor Visits Discussed, Doctor Visits Reviewed, Specialist  [discussed recent hematology visit and upcoming iron infusion and abd ultrasound]  PCP/Specialist Visits Compliance with follow-up visit  Education Interventions   Education Provided Provided Education  Provided Verbal Education On Nutrition, Mental Health/Coping with Illness, When to see the doctor, Medication, Labs  [has some anxiety over scheduled iron infusion since he had a reaction to last infusion. Discussed pre-mediation with steroids and H1/H2 blockers]  Nutrition Interventions   Nutrition Discussed/Reviewed Nutrition Discussed, Nutrition Reviewed  [iron rich foods]  Pharmacy Interventions   Pharmacy Dicussed/Reviewed Pharmacy Topics Discussed, Pharmacy Topics Reviewed, Medications and their functions  Safety Interventions   Safety Discussed/Reviewed Safety Discussed, Safety Reviewed       Follow up plan: Follow up call scheduled for 09/15/22    Encounter Outcome:  Pt. Visit Completed   Demetrios Loll, BSN, RN-BC RN Care Coordinator [facility]  [facility] Direct Dial: [phone] Main #: [phone]

## 2022-08-31 LAB — HAPTOGLOBIN: Haptoglobin: 94 mg/dL (ref 38–329)

## 2022-08-31 LAB — COPPER, SERUM: Copper: 106 ug/dL (ref 69–132)

## 2022-08-31 LAB — RHEUMATOID FACTOR: Rheumatoid fact SerPl-aCnc: <10 IU/mL (ref <14.0)

## 2022-08-31 LAB — METHYLMALONIC ACID, SERUM: Methylmalonic Acid, Quantitative: 292 nmol/L (ref 0–378)

## 2022-09-01 LAB — PROTEIN ELECTROPHORESIS, SERUM
A/G Ratio: 1.5 (ref 0.7–1.7)
Albumin ELP: 4 g/dL (ref 2.9–4.4)
Alpha-1-Globulin: 0.3 g/dL (ref 0.0–0.4)
Alpha-2-Globulin: 0.7 g/dL (ref 0.4–1.0)
Beta Globulin: 0.8 g/dL (ref 0.7–1.3)
Gamma Globulin: 0.9 g/dL (ref 0.4–1.8)
Globulin, Total: 2.7 g/dL (ref 2.2–3.9)
Total Protein ELP: 6.7 g/dL (ref 6.0–8.5)

## 2022-09-01 LAB — ANTINUCLEAR ANTIBODIES, IFA: ANA Ab, IFA: POSITIVE — AB

## 2022-09-01 LAB — FANA STAINING PATTERNS: Speckled Pattern: 24529

## 2022-09-03 LAB — IMMUNOFIXATION ELECTROPHORESIS
IgA: 130 mg/dL (ref 61–437)
IgG (Immunoglobin G), Serum: 1030 mg/dL (ref 603–1613)
IgM (Immunoglobulin M), Srm: 131 mg/dL (ref 15–143)
Total Protein ELP: 6.7 g/dL (ref 6.0–8.5)

## 2022-09-07 ENCOUNTER — Ambulatory Visit: Payer: Medicare HMO | Admitting: Oncology

## 2022-09-08 ENCOUNTER — Inpatient Hospital Stay: Payer: Medicare HMO

## 2022-09-08 ENCOUNTER — Ambulatory Visit (HOSPITAL_COMMUNITY): Payer: Medicare HMO

## 2022-09-08 VITALS — BP 148/61 | HR 77 | Temp 97.7°F | Resp 18

## 2022-09-08 DIAGNOSIS — Z87891 Personal history of nicotine dependence: Secondary | ICD-10-CM | POA: Diagnosis not present

## 2022-09-08 DIAGNOSIS — R233 Spontaneous ecchymoses: Secondary | ICD-10-CM | POA: Diagnosis not present

## 2022-09-08 DIAGNOSIS — Z8049 Family history of malignant neoplasm of other genital organs: Secondary | ICD-10-CM | POA: Diagnosis not present

## 2022-09-08 DIAGNOSIS — D696 Thrombocytopenia, unspecified: Secondary | ICD-10-CM | POA: Diagnosis not present

## 2022-09-08 DIAGNOSIS — D500 Iron deficiency anemia secondary to blood loss (chronic): Secondary | ICD-10-CM

## 2022-09-08 DIAGNOSIS — D509 Iron deficiency anemia, unspecified: Secondary | ICD-10-CM | POA: Diagnosis not present

## 2022-09-08 DIAGNOSIS — Z7901 Long term (current) use of anticoagulants: Secondary | ICD-10-CM | POA: Diagnosis not present

## 2022-09-08 DIAGNOSIS — R760 Raised antibody titer: Secondary | ICD-10-CM | POA: Diagnosis not present

## 2022-09-08 DIAGNOSIS — Z7902 Long term (current) use of antithrombotics/antiplatelets: Secondary | ICD-10-CM | POA: Diagnosis not present

## 2022-09-08 DIAGNOSIS — R161 Splenomegaly, not elsewhere classified: Secondary | ICD-10-CM | POA: Diagnosis not present

## 2022-09-08 MED ORDER — SODIUM CHLORIDE 0.9 % IV SOLN: Freq: Once | INTRAVENOUS | Status: AC

## 2022-09-08 MED ORDER — FAMOTIDINE IN NACL 20-0.9 MG/50ML-% IV SOLN
20.0000 mg | Freq: Once | INTRAVENOUS | Status: AC
  Administered 2022-09-08: 20 mg via INTRAVENOUS
  Filled 2022-09-08: qty 50

## 2022-09-08 MED ORDER — SODIUM CHLORIDE 0.9 % IV SOLN
950.0000 mg | Freq: Once | INTRAVENOUS | Status: AC
  Administered 2022-09-08: 950 mg via INTRAVENOUS
  Filled 2022-09-08: qty 19

## 2022-09-08 MED ORDER — ACETAMINOPHEN 325 MG PO TABS
650.0000 mg | ORAL_TABLET | Freq: Once | ORAL | Status: AC
  Administered 2022-09-08: 650 mg via ORAL
  Filled 2022-09-08: qty 2

## 2022-09-08 MED ORDER — SODIUM CHLORIDE 0.9 % IV SOLN
50.0000 mg | Freq: Once | INTRAVENOUS | Status: AC
  Administered 2022-09-08: 50 mg via INTRAVENOUS
  Filled 2022-09-08: qty 1

## 2022-09-08 MED ORDER — METHYLPREDNISOLONE SODIUM SUCC 125 MG IJ SOLR
125.0000 mg | Freq: Once | INTRAMUSCULAR | Status: AC
  Administered 2022-09-08: 125 mg via INTRAVENOUS
  Filled 2022-09-08: qty 2

## 2022-09-08 MED ORDER — CETIRIZINE HCL 10 MG/ML IV SOLN
10.0000 mg | Freq: Once | INTRAVENOUS | Status: AC
  Administered 2022-09-08: 10 mg via INTRAVENOUS
  Filled 2022-09-08: qty 1

## 2022-09-08 NOTE — Patient Instructions (Signed)
[facility] [place]  Discharge Instructions: Thank you for choosing [facility] to provide your oncology and hematology care.  If you have a lab appointment with the Cancer Center - please note that after April 8th, 2024, all labs will be drawn in the cancer center.  You do not have to check in or register with the main entrance as you have in the past but will complete your check-in in the cancer center.  Wear comfortable clothing and clothing appropriate for easy access to any Portacath or PICC line.   We strive to give you quality time with your provider. You may need to reschedule your appointment if you arrive late (15 or more minutes).  Arriving late affects you and other patients whose appointments are after yours.  Also, if you miss three or more appointments without notifying the office, you may be dismissed from the clinic at the provider's discretion.      For prescription refill requests, have your pharmacy contact our office and allow 72 hours for refills to be completed.    Iron Dextran Injection What is this medication? IRON DEXTRAN (EYE ern DEX tran) treats low levels of iron in your body. Iron is a mineral that plays an important role in making red blood cells, which carry oxygen from your lungs to the rest of your body. This medicine may be used for other purposes; ask your health care provider or pharmacist if you have questions. COMMON BRAND NAME(S): Dexferrum, INFeD What should I tell my care team before I take this medication? They need to know if you have any of these conditions: Anemia not caused by low iron levels Heart disease High levels of iron in the blood Kidney disease Liver disease An unusual or allergic reaction to iron, other medications, foods, dyes, or preservatives Pregnant or trying to get pregnant Breastfeeding How should I use this medication? This medication is for injection into a vein or a muscle. It is given in a  hospital or clinic setting. Talk to your care team about the use of this medication in children. While this medication may be prescribed for children as young as 4 months old for selected conditions, precautions do apply. Overdosage: If you think you have taken too much of this medicine contact a poison control center or emergency room at once. NOTE: This medicine is only for you. Do not share this medicine with others. What if I miss a dose? Keep appointments for follow-up doses. It is important not to miss your dose. Call your care team if you are unable to keep an appointment. What may interact with this medication? Do not take this medication with any of the following: Deferoxamine Dimercaprol Other iron products This medication may also interact with the following: Chloramphenicol Deferasirox This list may not describe all possible interactions. Give your health care provider a list of all the medicines, herbs, non-prescription drugs, or dietary supplements you use. Also tell them if you smoke, drink alcohol, or use illegal drugs. Some items may interact with your medicine. What should I watch for while using this medication? Visit your care team for regular checks on your progress. Tell your care team if your symptoms do not start to get better or if they get worse. You may need blood work while taking this medication. You may need to eat more foods that contain iron. Talk to your care team. Foods that contain iron include whole grains/cereals, dried fruits, beans, peas, leafy green vegetables,   and organ meats (liver, kidney). Long-term use of this medication may increase your risk of some cancers. Talk to your care team about your risk of cancer. What side effects may I notice from receiving this medication? Side effects that you should report to your care team as soon as possible: Allergic reactions--skin rash, itching, hives, swelling of the face, lips, tongue, or throat Low blood  pressure--dizziness, feeling faint or lightheaded, blurry vision Shortness of breath Side effects that usually do not require medical attention (report to your care team if they continue or are bothersome): Flushing Headache Joint pain Muscle pain Nausea Pain, redness, or irritation at injection site This list may not describe all possible side effects. Call your doctor for medical advice about side effects. You may report side effects to FDA at 1-800-FDA-1088. Where should I keep my medication? This medication is given in a hospital or clinic. It will not be stored at home. NOTE: This sheet is a summary. It may not cover all possible information. If you have questions about this medicine, talk to your doctor, pharmacist, or health care provider.  2024 Elsevier/Gold Standard (2021-09-22 00:00:00)    To help prevent nausea and vomiting after your treatment, we encourage you to take your nausea medication as directed.  BELOW ARE SYMPTOMS THAT SHOULD BE REPORTED IMMEDIATELY: *FEVER GREATER THAN 100.4 F (38 C) OR HIGHER *CHILLS OR SWEATING *NAUSEA AND VOMITING THAT IS NOT CONTROLLED WITH YOUR NAUSEA MEDICATION *UNUSUAL SHORTNESS OF BREATH *UNUSUAL BRUISING OR BLEEDING *URINARY PROBLEMS (pain or burning when urinating, or frequent urination) *BOWEL PROBLEMS (unusual diarrhea, constipation, pain near the anus) TENDERNESS IN MOUTH AND THROAT WITH OR WITHOUT PRESENCE OF ULCERS (sore throat, sores in mouth, or a toothache) UNUSUAL RASH, SWELLING OR PAIN  UNUSUAL VAGINAL DISCHARGE OR ITCHING   Items with * indicate a potential emergency and should be followed up as soon as possible or go to the Emergency Department if any problems should occur.  Please show the CHEMOTHERAPY ALERT CARD or IMMUNOTHERAPY ALERT CARD at check-in to the Emergency Department and triage nurse.  Should you have questions after your visit or need to cancel or reschedule your appointment, please contact [facility] [place] [phone]  and follow the prompts.  Office hours are 8:00 a.m. to 4:30 p.m. Monday - Friday. Please note that voicemails left after 4:00 p.m. may not be returned until the following business day.  We are closed weekends and major holidays. You have access to a nurse at all times for urgent questions. Please call the main number to the clinic [phone] and follow the prompts.  For any non-urgent questions, you may also contact your provider using MyChart. We now offer e-Visits for anyone 18 and older to request care online for non-urgent symptoms. For details visit [email or web address].   Also download the MyChart app! Go to the app store, search "MyChart", open the app, select [facility], and log in with your MyChart username and password.   

## 2022-09-08 NOTE — Progress Notes (Signed)
Patient presents today for iron infusion.  Patient is in satisfactory condition with no new complaints voiced.  Vital signs are stable.  IV placed in R arm.  IV flushed well with good blood return noted.  We will proceed with infusion per provider orders.    Patient tolerated infusion well with no complaints voiced.  Patient left ambulatory with wife and daughter in stable condition.  Vital signs stable at discharge.  Follow up as scheduled.

## 2022-09-12 ENCOUNTER — Ambulatory Visit (HOSPITAL_COMMUNITY)
Admission: RE | Admit: 2022-09-12 | Discharge: 2022-09-12 | Disposition: A | Discharge status: Home / Self Care | Payer: Medicare HMO | Source: Ambulatory Visit | Attending: Hematology | Admitting: Hematology

## 2022-09-12 DIAGNOSIS — D696 Thrombocytopenia, unspecified: Secondary | ICD-10-CM | POA: Insufficient documentation

## 2022-09-12 DIAGNOSIS — D509 Iron deficiency anemia, unspecified: Secondary | ICD-10-CM | POA: Insufficient documentation

## 2022-09-12 DIAGNOSIS — D649 Anemia, unspecified: Secondary | ICD-10-CM | POA: Diagnosis not present

## 2022-09-12 DIAGNOSIS — R161 Splenomegaly, not elsewhere classified: Secondary | ICD-10-CM | POA: Diagnosis not present

## 2022-09-15 ENCOUNTER — Ambulatory Visit: Payer: Self-pay | Admitting: *Deleted

## 2022-09-15 ENCOUNTER — Encounter: Payer: Self-pay | Admitting: *Deleted

## 2022-09-19 ENCOUNTER — Ambulatory Visit: Payer: Medicare HMO | Attending: Cardiovascular Disease | Admitting: Cardiovascular Disease

## 2022-09-19 ENCOUNTER — Encounter: Payer: Self-pay | Admitting: Cardiovascular Disease

## 2022-09-19 DIAGNOSIS — Z299 Encounter for prophylactic measures, unspecified: Secondary | ICD-10-CM | POA: Diagnosis not present

## 2022-09-19 DIAGNOSIS — M799 Soft tissue disorder, unspecified: Secondary | ICD-10-CM | POA: Diagnosis not present

## 2022-09-19 DIAGNOSIS — Z9181 History of falling: Secondary | ICD-10-CM | POA: Diagnosis not present

## 2022-09-19 DIAGNOSIS — M25531 Pain in right wrist: Secondary | ICD-10-CM | POA: Diagnosis not present

## 2022-09-19 DIAGNOSIS — M19031 Primary osteoarthritis, right wrist: Secondary | ICD-10-CM | POA: Diagnosis not present

## 2022-09-19 DIAGNOSIS — R059 Cough, unspecified: Secondary | ICD-10-CM | POA: Diagnosis not present

## 2022-09-19 DIAGNOSIS — M19032 Primary osteoarthritis, left wrist: Secondary | ICD-10-CM | POA: Diagnosis not present

## 2022-09-19 DIAGNOSIS — Z79899 Other long term (current) drug therapy: Secondary | ICD-10-CM | POA: Diagnosis not present

## 2022-09-19 DIAGNOSIS — M25521 Pain in right elbow: Secondary | ICD-10-CM | POA: Diagnosis not present

## 2022-09-19 DIAGNOSIS — R0981 Nasal congestion: Secondary | ICD-10-CM | POA: Diagnosis not present

## 2022-09-19 DIAGNOSIS — M25532 Pain in left wrist: Secondary | ICD-10-CM | POA: Diagnosis not present

## 2022-09-19 NOTE — Patient Outreach (Signed)
  Care Coordination   Follow Up Visit Note   09/15/2022 Name: Mike Wright MRN: [id number] DOB: [birth year]  Mike Wright is a 89 y.o. year old male who sees [name], MD for primary care. I spoke with  [name] by phone today.  What matters to the patients health and wellness today?  Managing anemia    Goals Addressed             This Visit's Progress    Manage Anemia       Care Coordination Goals: Patient will follow-up with PCP and hematologist as planned and sooner if needed Patient did have iron infusion. He was premedicated for allergic reaction in the past. He did well with this infusion.  Patient will monitor for signs of worsening anemia and notify hematologist if he develops any Patient feels well since infusion. Reports an increase in energy and improvement in fatigue. Patient will seek emergency medical attention if needed Patient will notify provider of any obvious signs of bleeding Patient will take medications as prescribed Patient will continue to eat food rich in iron. He does tolerate these without a reaction. Patient will reach out to RN Care Coordinator [phone] with any care coordination or resource needs        SDOH assessments and interventions completed:  No     Care Coordination Interventions:  Yes, provided  Interventions Today    Flowsheet Row Most Recent Value  Chronic Disease   Chronic disease during today's visit Other  [iron deficiency anemia]  General Interventions   General Interventions Discussed/Reviewed General Interventions Discussed, General Interventions Reviewed, Labs, Doctor Visits  Labs --  [reviewed hematology labs]  Doctor Visits Discussed/Reviewed Doctor Visits Discussed, Doctor Visits Reviewed, PCP, Specialist  PCP/Specialist Visits Compliance with follow-up visit  [hematologist on 09/20/22]  Exercise Interventions   Exercise Discussed/Reviewed Physical Activity  Physical Activity Discussed/Reviewed  Physical Activity Discussed, Physical Activity Reviewed  Education Interventions   Education Provided Provided Education  Provided Verbal Education On Nutrition, Mental Health/Coping with Illness, When to see the doctor, Exercise, Labs, Medication  Nutrition Interventions   Nutrition Discussed/Reviewed Nutrition Discussed, Nutrition Reviewed  Pharmacy Interventions   Pharmacy Dicussed/Reviewed Pharmacy Topics Discussed, Pharmacy Topics Reviewed, Medications and their functions  Safety Interventions   Safety Discussed/Reviewed Safety Discussed, Safety Reviewed       Follow up plan: Follow up call scheduled for 10/14/22    Encounter Outcome:  Pt. Visit Completed   Demetrios Loll, BSN, RN-BC RN Care Coordinator [facility]  [facility] Direct Dial: [phone] Main #: [phone]

## 2022-09-19 NOTE — Progress Notes (Deleted)
[facility] Consult Note  No chief complaint on file.  History of Present Illness: 89 yo male with history of CAD, chronic diastolic CHF, COPD, DJD, atrial fibrillation, HTN, GERD, HIT, hypothyroidism, HLD, PAD, sleep apnea, DM and moderately severe aortic stenosis who is here today as a new consult, referred by Dr. Wyline Mood, for the evaluation of his aortic stenosis and discussion about TAVR. He has CAD and had stents placed in the Diagonal branch and LAD in February 2019. He has been followed by Dr. Wyline Mood for moderate aortic stenosis. Echo April 2024 with LVEF=70-75%. Normal RV function. Severe mitral annular calcification with no mitral stenosis or regurgitation. Moderately severe aortic stenosis with mean gradient 33 mmHg, peak gradient 65 mmHg, AVA 1.05 cm2, DI 0.36, SVI 51. Cardiac monitor May 2024 with 100% atrial fib. His PAD is followed in the [facility] office by Dr. Myra Gianotti.   He tells me today that he He lives in *** He is retired Engineer, manufacturing ***  Primary Care Physician: [name], MD Primary Cardiologist: Wyline Mood Referring Cardiologist: Wyline Mood  Past Medical History:  Diagnosis Date   Anemia    a. mild, noted 04/2017.   CAD in native artery    a. [place] 04/2017 s/p DES to D1, DES to prox-mid LAD, DES to prox LAD overlapping the prior stent, LVEF 55-65%.    Chronic diastolic CHF (congestive heart failure) (HCC)    Constipation    COPD (chronic obstructive pulmonary disease) (HCC)    Diabetic ulcer of toe (HCC)    DJD (degenerative joint disease) of cervical spine    Dysrhythmia    AFib   Essential hypertension    GERD (gastroesophageal reflux disease)    History of hiatal hernia    HIT (heparin-induced thrombocytopenia) (HCC)    Hypothyroidism    Hypoxia    a. went home on home O2 04/2017.   Insomnia    Mixed hyperlipidemia    PAD (peripheral artery disease) (HCC)    PAF (paroxysmal atrial fibrillation) (HCC)    PVD (peripheral vascular disease) (HCC)    Renal  insufficiency    Retinal hemorrhage    lost 90% of vision.   Retinitis    Sinus bradycardia    a. HR 30s-40s in 04/2017 -> diltiazem stopped, metoprolol reduced.   Sleep apnea    "chose not to order CPAP at this time" (05/18/2017)   Type 2 diabetes mellitus (HCC)    Wears glasses    Wheezing    a. suspected COPD 04/2017. Former tobacco x 40 years.    Past Surgical History:  Procedure Laterality Date   ABDOMINAL AORTOGRAM W/LOWER EXTREMITY N/A 09/15/2017   Procedure: ABDOMINAL AORTOGRAM W/LOWER EXTREMITY;  Surgeon: Nada Libman, MD;  [facility];  Service: Cardiovascular;  Laterality: N/A;   ABDOMINAL AORTOGRAM W/LOWER EXTREMITY N/A 02/04/2020   Procedure: ABDOMINAL AORTOGRAM W/LOWER EXTREMITY;  Surgeon: Nada Libman, MD;  [facility]: [facility];  Service: Cardiovascular;  Laterality: N/A;   ABDOMINAL AORTOGRAM W/LOWER EXTREMITY N/A 12/15/2020   Procedure: ABDOMINAL AORTOGRAM W/LOWER EXTREMITY;  Surgeon: Nada Libman, MD;  [facility];  Service: Cardiovascular;  Laterality: N/A;   AMPUTATION Bilateral 09/20/2017   Procedure: BILATERAL GREAT TOE AMPUTATIONS INCLUDING METATARSAL HEADS;  Surgeon: Nada Libman, MD;  Location: [facility];  Service: Vascular;  Laterality: Bilateral;   AMPUTATION Right 03/01/2019   Procedure: AMPUTATION TOES;  Surgeon: Chuck Hint, MD;  Location: [facility] OR;  Service: Vascular;  Laterality: Right;   AMPUTATION Right 05/29/2019   Procedure: AMPUTATION RIGHT THIRD TOE;  Surgeon: Nada Libman, MD;  Location: [facility];  Service: Vascular;  Laterality: Right;   AMPUTATION Right 09/26/2019   Procedure: AMPUTATION RIGHT FOURTH TOE AND FIFTH TOES;  Surgeon: Nada Libman, MD;  [facility];  Service: Vascular;  Laterality: Right;   ANGIOPLASTY Right 09/20/2017   Procedure: ANGIOPLASTY RIGHT TIBIAL ARTERY;  Surgeon: Nada Libman, MD;  Location: [facility] OR;  Service: Vascular;  Laterality: Right;   APPENDECTOMY      APPLICATION OF WOUND VAC  12/17/2020   Procedure: APPLICATION OF WOUND VAC;  Surgeon: Nada Libman, MD;  [facility];  Service: Vascular;;   BACK SURGERY     CARDIAC CATHETERIZATION  1980s; 2012;   CATARACT EXTRACTION Left 2004   COLONOSCOPY  2004   FLEISHMAN TICS   COLONOSCOPY WITH PROPOFOL N/A 11/28/2018   Procedure: COLONOSCOPY WITH PROPOFOL;  Surgeon: Malissa Hippo, MD;  [facility];  Service: Endoscopy;  Laterality: N/A;   CORONARY ANGIOPLASTY WITH STENT PLACEMENT  05/18/2017   "3 stents"   CORONARY STENT INTERVENTION N/A 05/18/2017   Procedure: CORONARY STENT INTERVENTION;  Surgeon: Corky Crafts, MD;  Location: [facility] INVASIVE CV LAB;  Service: Cardiovascular;  Laterality: N/A;   ESOPHAGOGASTRODUODENOSCOPY (EGD) WITH PROPOFOL N/A 11/20/2017   Procedure: ESOPHAGOGASTRODUODENOSCOPY (EGD) WITH PROPOFOL;  Surgeon: Hilarie Fredrickson, MD;  Location: [facility] ENDOSCOPY;  Service: Gastroenterology;  Laterality: N/A;   ESOPHAGOGASTRODUODENOSCOPY (EGD) WITH PROPOFOL N/A 11/28/2018   Procedure: ESOPHAGOGASTRODUODENOSCOPY (EGD) WITH PROPOFOL;  Surgeon: Malissa Hippo, MD;  [facility];  Service: Endoscopy;  Laterality: N/A;   ESOPHAGOGASTRODUODENOSCOPY (EGD) WITH PROPOFOL N/A 07/12/2019   Procedure: ESOPHAGOGASTRODUODENOSCOPY (EGD) WITH PROPOFOL;  Surgeon: Malissa Hippo, MD;  [facility];  Service: Endoscopy;  Laterality: N/A;  125   GIVENS CAPSULE STUDY N/A 12/17/2018   Procedure: GIVENS CAPSULE STUDY;  Surgeon: Malissa Hippo, MD;  [facility];  Service: Endoscopy;  Laterality: N/A;  7:30am   I & D EXTREMITY Right 12/17/2017   Procedure: IRRIGATION AND DEBRIDEMENT RIGHT GREAT TOE;  Surgeon: Nada Libman, MD;  [facility];  Service: Vascular;  Laterality: Right;   INCISION AND DRAINAGE Right 12/17/2020   Procedure: INCISION AND DRAINAGE OF RIGHT FOOT;  Surgeon: Nada Libman, MD;  [facility];  Service: Vascular;  Laterality: Right;    JOINT REPLACEMENT     LEFT HEART CATH AND CORONARY ANGIOGRAPHY N/A 05/18/2017   Procedure: LEFT HEART CATH AND CORONARY ANGIOGRAPHY;  Surgeon: Corky Crafts, MD;  Location: [facility] INVASIVE CV LAB;  Service: Cardiovascular;  Laterality: N/A;   LOWER EXTREMITY ANGIOGRAM Right 09/20/2017   Procedure: RIGHT LOWER LEG  ANGIOGRAM;  Surgeon: Nada Libman, MD;  [facility];  Service: Vascular;  Laterality: Right;   LUMBAR FUSION  2002   L3, 4 L4, 5 L5 S1 Fused by Dr. Trey Sailors   PERIPHERAL VASCULAR BALLOON ANGIOPLASTY Left 09/15/2017   Procedure: PERIPHERAL VASCULAR BALLOON ANGIOPLASTY;  Surgeon: Nada Libman, MD;  [facility];  Service: Cardiovascular;  Laterality: Left;  PTA of Peroneal & Posterior Tibial   PERIPHERAL VASCULAR INTERVENTION Right 12/15/2020   Procedure: PERIPHERAL VASCULAR INTERVENTION;  Surgeon: Nada Libman, MD;  [facility];  Service: Cardiovascular;  Laterality: Right;  SFA   POLYPECTOMY  11/28/2018   Procedure: POLYPECTOMY;  Surgeon: Malissa Hippo,  MD;  [facility];  Service: Endoscopy;;  duodenum   POSTERIOR LUMBAR FUSION     RHINOPLASTY     RIGHT HEART CATH N/A 05/18/2017   Procedure: RIGHT HEART CATH;  Surgeon: Corky Crafts, MD;  Location: [facility] INVASIVE CV LAB;  Service: Cardiovascular;  Laterality: N/A;   TEE WITHOUT CARDIOVERSION N/A 09/18/2017   Procedure: TRANSESOPHAGEAL ECHOCARDIOGRAM (TEE);  Surgeon: Pricilla Riffle, MD;  Location: [facility] ENDOSCOPY;  Service: Cardiovascular;  Laterality: N/A;   TOTAL KNEE ARTHROPLASTY Bilateral    TRANSMETATARSAL AMPUTATION Right 02/04/2020   Procedure: RIGHT TRANSMETATARSAL AMPUTATION;  Surgeon: Nada Libman, MD;  Location: [facility] OR;  Service: Vascular;  Laterality: Right;   TRANSMETATARSAL AMPUTATION Left 11/12/2021   Procedure: LEFT TRANSMETATARSAL AMPUTATION;  Surgeon: Maeola Harman, MD;  Location: [facility] OR;  Service: Vascular;  Laterality: Left;   TRANSURETHRAL  RESECTION OF PROSTATE  2001   Rito Ehrlich    Current Outpatient Medications  Medication Sig Dispense Refill   Accu-Chek Softclix Lancets lancets      acetaminophen (TYLENOL) 500 MG tablet Take 1 tablet (500 mg total) by mouth every 6 (six) hours as needed for mild pain or headache. (Patient taking differently: Take 1,000 mg by mouth at bedtime.) 30 tablet 0   Alcohol Swabs (B-D SINGLE USE SWABS REGULAR) PADS      amLODipine (NORVASC) 2.5 MG tablet Take 2.5 mg by mouth daily.     apixaban (ELIQUIS) 2.5 MG TABS tablet Take 1 tablet (2.5 mg total) by mouth 2 (two) times daily. (Patient taking differently: Take 5 mg by mouth 2 (two) times daily.) 60 tablet 11   atorvastatin (LIPITOR) 80 MG tablet Take 80 mg by mouth every evening.      Blood Glucose Monitoring Suppl (ACCU-CHEK GUIDE) w/Device KIT      Cholecalciferol (VITAMIN D3) 50 MCG (2000 UT) TABS Take 2,000 Units by mouth 2 (two) times daily.     clopidogrel (PLAVIX) 75 MG tablet Take 1 tablet (75 mg total) by mouth daily with breakfast. 30 tablet 2   Cyanocobalamin (B-12) 2500 MCG TABS Take 2,500 mcg by mouth daily.     diazepam (VALIUM) 2 MG tablet Take 2 mg by mouth daily.     DROPLET INSULIN SYRINGE 31G X 5/16" 1 ML MISC      folic acid (FOLVITE) 1 MG tablet Take 1 tablet (1 mg total) by mouth daily. 30 tablet 1   insulin glargine (LANTUS) 100 UNIT/ML injection Inject 30 Units into the skin 2 (two) times daily as needed.     ipratropium-albuterol (DUONEB) 0.5-2.5 (3) MG/3ML SOLN Take 3 mLs by nebulization every other day.     isosorbide mononitrate (IMDUR) 60 MG 24 hr tablet Take 0.5 tablets (30 mg total) by mouth 2 (two) times daily. (Patient taking differently: Take 60 mg by mouth 2 (two) times daily.) 90 tablet 1   nitroGLYCERIN (NITROSTAT) 0.4 MG SL tablet Place 1 tablet (0.4 mg total) under the tongue every 5 (five) minutes as needed for chest pain. 25 tablet 3   OXYGEN Inhale 2 L/min into the lungs See admin instructions. 2 L/min of  oxygen at bedtime and during the day as needed for shortness of breath     pantoprazole (PROTONIX) 40 MG tablet Take 1 tablet (40 mg total) by mouth 2 (two) times daily. To protect stomach while taking multiple blood thinners. (Patient taking differently: Take 40 mg by mouth daily. To protect stomach while taking multiple blood thinners.) 60 tablet 2  potassium chloride SA (KLOR-CON M) 20 MEQ tablet Take 1 tablet (20 mEq total) by mouth 2 (two) times daily. 30 tablet 1   SPIRIVA HANDIHALER 18 MCG inhalation capsule Place 1 capsule into inhaler and inhale daily after breakfast.      torsemide (DEMADEX) 20 MG tablet Take 1 tablet (20 mg total) by mouth 2 (two) times daily. 180 tablet 1   traZODone (DESYREL) 100 MG tablet Take 200 mg by mouth at bedtime.     TRUE METRIX BLOOD GLUCOSE TEST test strip      No current facility-administered medications for this visit.    Allergies  Allergen Reactions   Codeine Shortness Of Breath   Doxycycline Swelling    Swelling and numbness in lips and face. Swelling improved after stopping. Reports still experiences numbness in bottom lip.    Feraheme [Ferumoxytol] Other (See Comments)    Diaphoretic, chest pain   Heparin Other (See Comments)    +HIT,  Severe bleeding (with heparin drip & large doses), tolerates low doses   Iron Shortness Of Breath    Patient severe reaction to IV iron and does not tolerate oral formulations either   Losartan Swelling   Oxycodone Other (See Comments)    "Made me act out of my mind" Mental status changes- hallucinations   Latex Rash    Social History   Socioeconomic History   Marital status: Married    Spouse name: patsy   Number of children: Not on file   Years of education: Not on file   Highest education level: Not on file  Occupational History   Not on file  Tobacco Use   Smoking status: Former    Packs/day: 2.00    Years: 30.00    Additional pack years: 0.00    Total pack years: 60.00    Types:  Cigarettes    Start date: 03/28/1953    Quit date: 03/29/1983    Years since quitting: 39.5   Smokeless tobacco: Never  Vaping Use   Vaping Use: Never used  Substance and Sexual Activity   Alcohol use: Not Currently    Comment: 05/18/2017 'I drink a beer q yr"   Drug use: No   Sexual activity: Not Currently  Other Topics Concern   Not on file  Social History Narrative   Patient lives at home with his spouse.    Social Determinants of Health   Financial Resource Strain: Low Risk  (08/30/2022)   Overall Financial Resource Strain (CARDIA)    Difficulty of Paying Living Expenses: Not hard at all  Food Insecurity: No Food Insecurity (08/29/2022)   Hunger Vital Sign    Worried About Running Out of Food in the Last Year: Never true    Ran Out of Food in the Last Year: Never true  Transportation Needs: No Transportation Needs (08/30/2022)   PRAPARE - Administrator, Civil Service (Medical): No    Lack of Transportation (Non-Medical): No  Physical Activity: Inactive (08/03/2022)   Exercise Vital Sign    Days of Exercise per Week: 0 days    Minutes of Exercise per Session: 0 min  Stress: No Stress Concern Present (07/29/2019)   [employer] of Occupational Health - Occupational Stress Questionnaire    Feeling of Stress : Not at all  Social Connections: Not on file  Intimate Partner Violence: Not At Risk (08/29/2022)   Humiliation, Afraid, Rape, and Kick questionnaire    Fear of Current or Ex-Partner: No  Emotionally Abused: No    Physically Abused: No    Sexually Abused: No    Family History  Problem Relation Age of Onset   Heart attack Father 28   COPD Father    COPD Mother    Heart disease Mother    Diabetes Mother    Hypertension Sister    CVA Sister    Diabetes Sister    Multiple sclerosis Sister     Review of Systems:  As stated in the HPI and otherwise negative.   There were no vitals taken for this visit.  Physical Examination: General: Well developed,  well nourished, NAD  HEENT: OP clear, mucus membranes moist  SKIN: warm, dry. No rashes. Neuro: No focal deficits  Musculoskeletal: Muscle strength 5/5 all ext  Psychiatric: Mood and affect normal  Neck: No JVD, no carotid bruits, no thyromegaly, no lymphadenopathy.  Lungs:Clear bilaterally, no wheezes, rhonci, crackles Cardiovascular: Regular rate and rhythm. *** Loud, harsh, late peaking systolic murmur.  Abdomen:Soft. Bowel sounds present. Non-tender.  Extremities: *** No lower extremity edema. Pulses are 2 + in the bilateral DP/PT.  EKG:  EKG {ACTION; IS/IS [id number]} ordered today. The ekg ordered today demonstrates ***  Echo 07/19/22: 1. Left ventricular ejection fraction, by estimation, is 70 to 75%. The  left ventricle has hyperdynamic function. The left ventricle has no  regional wall motion abnormalities. There is mild left ventricular  hypertrophy. Left ventricular diastolic  function could not be evaluated due to atrial flutter.   2. Right ventricular systolic function is normal. The right ventricular  size is normal. There is severely elevated pulmonary artery systolic  pressure. The estimated right ventricular systolic pressure is 73.1 mmHg.   3. Left atrial size was severely dilated.   4. Right atrial size was severely dilated.   5. The mitral valve is abnormal. No evidence of mitral valve  regurgitation. No evidence of mitral stenosis. Severe mitral annular  calcification.   6. The tricuspid valve is abnormal. Tricuspid valve regurgitation is mild  to moderate.   7. The aortic valve is calcified. Aortic valve regurgitation is mild.  Moderate to severe aortic valve stenosis. Aortic valve area, by VTI  measures 1.13 cm. Aortic valve mean gradient measures 33.0 mmHg. Aortic  valve Vmax measures 4.03 m/s. AV DVI 0.33.   8. The inferior vena cava is dilated in size with <50% respiratory  variability, suggesting right atrial pressure of 15 mmHg.   9. Increased flow  velocities may be secondary to anemia, thyrotoxicosis,  hyperdynamic or high flow state.   Comparison(s): Changes from prior study are noted. AS worsened from  moderate (DVI 0.33, Vmax 3.46 m/sec, mean PG 25 mm Hg) in 10/2021 to  moderate-severe (DVI 0.33, Vmax 4.03 m/sec, mean PG 33 mm Hg) now.   FINDINGS   Left Ventricle: Left ventricular ejection fraction, by estimation, is 70  to 75%. The left ventricle has hyperdynamic function. The left ventricle  has no regional wall motion abnormalities. The left ventricular internal  cavity size was normal in size.  There is mild left ventricular hypertrophy. Left ventricular diastolic  function could not be evaluated due to atrial fibrillation. Left  ventricular diastolic function could not be evaluated.   Right Ventricle: The right ventricular size is normal. No increase in  right ventricular wall thickness. Right ventricular systolic function is  normal. There is severely elevated pulmonary artery systolic pressure. The  tricuspid regurgitant velocity is  3.81 m/s, and with an assumed right atrial pressure  of 15 mmHg, the  estimated right ventricular systolic pressure is 73.1 mmHg.   Left Atrium: Left atrial size was severely dilated.   Right Atrium: Right atrial size was severely dilated.   Pericardium: There is no evidence of pericardial effusion.   Mitral Valve: The mitral valve is abnormal. Severe mitral annular  calcification. No evidence of mitral valve regurgitation. No evidence of  mitral valve stenosis.   Tricuspid Valve: The tricuspid valve is abnormal. Tricuspid valve  regurgitation is mild to moderate. No evidence of tricuspid stenosis.   Aortic Valve: The aortic valve is calcified. Aortic valve regurgitation is  mild. Aortic regurgitation PHT measures 469 msec. Moderate to severe  aortic stenosis is present. Aortic valve mean gradient measures 33.0 mmHg.  Aortic valve peak gradient measures   64.8 mmHg. Aortic valve  area, by VTI measures 1.13 cm.   Pulmonic Valve: The pulmonic valve was normal in structure. Pulmonic valve  regurgitation is trivial. No evidence of pulmonic stenosis.   Aorta: The aortic root is normal in size and structure.   Venous: The inferior vena cava is dilated in size with less than 50%  respiratory variability, suggesting right atrial pressure of 15 mmHg.   IAS/Shunts: No atrial level shunt detected by color flow Doppler.     LEFT VENTRICLE  PLAX 2D  LVIDd:         5.10 cm   Diastology  LVIDs:         2.50 cm   LV e' medial:    6.74 cm/s  LV PW:         1.30 cm   LV E/e' medial:  21.8  LV IVS:        1.10 cm   LV e' lateral:   8.38 cm/s  LVOT diam:     2.00 cm   LV E/e' lateral: 17.5  LV SV:         101  LV SV Index:   51  LVOT Area:     3.14 cm     RIGHT VENTRICLE  RV S prime:     11.90 cm/s  TAPSE (M-mode): 2.0 cm   LEFT ATRIUM              Index        RIGHT ATRIUM           Index  LA diam:        4.90 cm  2.45 cm/m   RA Area:     32.20 cm  LA Vol (A2C):   112.0 ml 56.00 ml/m  RA Volume:   120.00 ml 60.00 ml/m  LA Vol (A4C):   115.0 ml 57.50 ml/m  LA Biplane Vol: 121.0 ml 60.50 ml/m   AORTIC VALVE  AV Area (Vmax):    1.05 cm  AV Area (Vmean):   1.05 cm  AV Area (VTI):     1.13 cm  AV Vmax:           402.50 cm/s  AV Vmean:          260.000 cm/s  AV VTI:            0.900 m  AV Peak Grad:      64.8 mmHg  AV Mean Grad:      33.0 mmHg  LVOT Vmax:         134.00 cm/s  LVOT Vmean:        86.700 cm/s  LVOT VTI:  0.323 m  LVOT/AV VTI ratio: 0.36  AI PHT:            469 msec    AORTA  Ao Root diam: 3.20 cm   MITRAL VALVE                TRICUSPID VALVE  MV Area (PHT): 4.06 cm     TR Peak grad:   58.1 mmHg  MV Decel Time: 187 msec     TR Vmax:        381.00 cm/s  MV E velocity: 147.00 cm/s  MV A velocity: 67.00 cm/s   SHUNTS  MV E/A ratio:  2.19         Systemic VTI:  0.32 m                              Systemic Diam: 2.00 cm   Recent  Labs: 07/27/2022: ALT 13; B Natriuretic Peptide 226.0 08/23/2022: BUN 28; Creatinine, Ser 1.39; Magnesium 2.1; Potassium 4.3; Sodium 140; TSH 5.220 08/29/2022: Hemoglobin 10.7; Platelets 110   Lipid Panel    Component Value Date/Time   CHOL 68 03/01/2019 0320   TRIG 114 03/01/2019 0320   HDL 16 (L) 03/01/2019 0320   CHOLHDL 4.3 03/01/2019 0320   VLDL 23 03/01/2019 0320   LDLCALC 29 03/01/2019 0320     Wt Readings from Last 3 Encounters:  08/29/22 80.2 kg  08/15/22 79.9 kg  07/27/22 80.9 kg     Assessment and Plan:   1. Severe Aortic Valve Stenosis: *** has severe, stage D aortic valve stenosis. *** has NYHA class *** symptoms. I have personally reviewed the echo images. The aortic valve is thickened and calcified with limited leaflet mobility. I think *** would benefit from AVR. Given advanced age, *** is not a good candidate for conventional AVR by surgical approach. I think *** may be a good candidate for TAVR.   I have reviewed the natural history of aortic stenosis with the patient and their family members  who are present today. We have discussed the limitations of medical therapy and the poor prognosis associated with symptomatic aortic stenosis. We have reviewed potential treatment options, including palliative medical therapy, conventional surgical aortic valve replacement, and transcatheter aortic valve replacement. We discussed treatment options in the context of the patient's specific comorbid medical conditions.    *** would like to proceed with planning for TAVR. I will arrange a right and left heart catheterization at [facility] ***. Risks and benefits of the cath procedure and the valve procedure are reviewed with the patient. After the cath, *** will have a cardiac CT, CTA of the chest/abdomen and pelvis and will then be referred to see one of the CT surgeons on our TAVR team.     Labs/ tests ordered today include:  No orders of the defined types were placed in this  encounter.    Disposition:   F/U will be arranged with the structural team  Signed, Verne Carrow, MD, [facility] 09/19/2022 8:00 AM    [facility] [address] [place], [place], [place]  [address] Phone: [phone]; Fax: [phone]

## 2022-09-20 ENCOUNTER — Inpatient Hospital Stay: Payer: Medicare HMO | Admitting: Oncology

## 2022-09-20 VITALS — BP 121/65 | HR 87 | Temp 98.4°F | Resp 16

## 2022-09-20 DIAGNOSIS — M25521 Pain in right elbow: Secondary | ICD-10-CM

## 2022-09-20 DIAGNOSIS — Z7902 Long term (current) use of antithrombotics/antiplatelets: Secondary | ICD-10-CM | POA: Diagnosis not present

## 2022-09-20 DIAGNOSIS — Z7901 Long term (current) use of anticoagulants: Secondary | ICD-10-CM | POA: Diagnosis not present

## 2022-09-20 DIAGNOSIS — D696 Thrombocytopenia, unspecified: Secondary | ICD-10-CM | POA: Diagnosis not present

## 2022-09-20 DIAGNOSIS — R760 Raised antibody titer: Secondary | ICD-10-CM | POA: Diagnosis not present

## 2022-09-20 DIAGNOSIS — D509 Iron deficiency anemia, unspecified: Secondary | ICD-10-CM | POA: Diagnosis not present

## 2022-09-20 DIAGNOSIS — D500 Iron deficiency anemia secondary to blood loss (chronic): Secondary | ICD-10-CM

## 2022-09-20 DIAGNOSIS — R161 Splenomegaly, not elsewhere classified: Secondary | ICD-10-CM | POA: Diagnosis not present

## 2022-09-20 DIAGNOSIS — R233 Spontaneous ecchymoses: Secondary | ICD-10-CM | POA: Diagnosis not present

## 2022-09-20 DIAGNOSIS — Z87891 Personal history of nicotine dependence: Secondary | ICD-10-CM | POA: Diagnosis not present

## 2022-09-20 DIAGNOSIS — Z8049 Family history of malignant neoplasm of other genital organs: Secondary | ICD-10-CM | POA: Diagnosis not present

## 2022-09-20 NOTE — Progress Notes (Signed)
[facility] [address], [place] [address]   Clinic Day:  09/20/2022  Referring physician: [name], MD  Patient Care Team: [name], MD as PCP - General (Internal Medicine) Wyline Mood Dorothe Pea, MD as PCP - Cardiology (Cardiology) [name], MD as Referring Physician (Internal Medicine) Gwenith Daily, RN as [facility] Care Management Doreatha Massed, MD as Medical Oncologist (Hematology)   ASSESSMENT & PLAN:   Assessment:  1.  Thrombocytopenia: - Patient seen at the request of Sharlene Dory, NP for thrombocytopenia. - CBC (08/23/2022): PLT-87, Hb-10.2, MCV-77.  WBC-6.1. - He is on Plavix and Eliquis 2.5 mg twice daily.  New medication was torsemide. - Denies any recent antibiotics.  No bleeding.  Easy bruising positive. - He had 2-3 episodes of black stools in the last 1 month.  He also takes Pepto-Bismol.  Does not take quinine compounds.  2.  Social/family history: - Lives at home with his wife.  Uses walker/cane for ambulation.  Independent of ADLs and was driving until August 9629.  He is a retired Dentist.  Quit smoking 30 years ago. - No family history of thrombocytopenia.  Mother had uterine cancer.  Maternal aunt had stomach cancer.  Another maternal aunt had kidney cancer and third maternal aunt had throat cancer.  Another maternal aunt had leukemia.  Maternal grandfather had prostate cancer.  Niece had breast cancer.  Plan:  1.  Moderate thrombocytopenia: - Likely secondary to iron deficiency anemia as well as splenomegaly. -Labs from 08/28/2022 did not reveal any nutritional deficiencies or infection.  ANA is positive.  No signs of hemolysis.  No concern for bone marrow infiltrative process.  Immunofixation electrophoresis WNL.  Hemoglobin 10.7.  MCV 76.7.  Platelet count 110,000.  Ferritin is 16 and iron saturations are 14%. -Ultrasound of his abdomen showed worsening splenomegaly from previous imaging from  2019.  -Recheck labs in 4 weeks (CBC with differential, ferritin and iron saturation).    2.  Severe iron deficiency anemia: - He had reaction to Feraheme in 2021 and required hospitalization for close monitoring.   -Received 1000 mg INFeD on 09/08/2022.  Tolerated well.  No infusion reaction. -Cannot tolerate oral iron. -Discussed he may need additional IV iron in the future. -Recommend repeat lab work in 4 to 6 weeks.  3.  Positive ANA: -Discussed with patient and wife. -No signs of autoimmune disorder based on assessment today. -Continue to monitor.  No need for referral at this time.  4.  Right elbow pain: -Secondary to fall on Saturday. -Had imaging completed yesterday at [facility].  Results are unavailable in Care Everywhere.  Anticipate they will call him in the next day or so with results.   PLAN SUMMARY: >> Repeat labs in 4 to 6 weeks (CBCD, ferritin, iron and TIBC) and follow-up with NP/MD.     I spent 20 minutes dedicated to the care of this patient (face-to-face and non-face-to-face) on the date of the encounter to include what is described in the assessment and plan.   No orders of the defined types were placed in this encounter.   [name], NP   6/25/20241:00 PM  CHIEF COMPLAINT/PURPOSE OF CONSULT:   Diagnosis: Pancytopenia   Current Therapy: INFeD x 1  HISTORY OF PRESENT ILLNESS:   Mike Wright is a 89 y.o. male presenting to clinic today for evaluation of pancytopenia at the request of Sharlene Dory, NP. He was previously seen by Dr. Candise Che at [facility] in 12/2017  for pancytopenia. He was also referred back in 12/2019 and scheduled to see Dr. Al Pimple, but he did not show for his appointment.  He is here to receive results from his lab work from 08/29/2022.  He received 1 dose of 1000 mg of INFeD on 09/08/2022.  Tolerated infusion well.  Previously had a reaction with IV Feraheme back in 2019.  Has tried oral iron in the past but is unable to tolerate.  Wife reports he  was doing well post his infusion and unfortunately suffered a fall on Saturday.  States he was trying to get up and slipped from his lift chair landed on his right elbow.  He had imaging of his right elbow completed yesterday.  Still waiting to see results.  Appetite is 100% and energy level is better at 25%.   PAST MEDICAL HISTORY:   Past Medical History: Past Medical History:  Diagnosis Date   Anemia    a. mild, noted 04/2017.   CAD in native artery    a. [place] 04/2017 s/p DES to D1, DES to prox-mid LAD, DES to prox LAD overlapping the prior stent, LVEF 55-65%.    Chronic diastolic CHF (congestive heart failure) (HCC)    Constipation    COPD (chronic obstructive pulmonary disease) (HCC)    Diabetic ulcer of toe (HCC)    DJD (degenerative joint disease) of cervical spine    Dysrhythmia    AFib   Essential hypertension    GERD (gastroesophageal reflux disease)    History of hiatal hernia    HIT (heparin-induced thrombocytopenia) (HCC)    Hypothyroidism    Hypoxia    a. went home on home O2 04/2017.   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   Retinitis    Sinus bradycardia    a. HR 30s-40s in 04/2017 -> diltiazem stopped, metoprolol reduced.   Sleep apnea    "chose not to order CPAP at this time" (05/18/2017)   Type 2 diabetes mellitus (HCC)    Wears glasses    Wheezing    a. suspected COPD 04/2017. Former tobacco x 40 years.    Surgical History: Past Surgical History:  Procedure Laterality Date   ABDOMINAL AORTOGRAM W/LOWER EXTREMITY N/A 09/15/2017   Procedure: ABDOMINAL AORTOGRAM W/LOWER EXTREMITY;  Surgeon: Nada Libman, MD;  Location: [facility] INV[facility] CV [facility];  Service: Cardiovascular;  Laterality: N/A;   ABDOMINAL AORTOGRAM W/LOWER EXTREMITY N/A 02/04/2020   Procedure: ABDOMINAL  AORTOGRAM W/LOWER EXTREMITY;  Surgeon: Nada Libman, MD;  Location: [facility];  Service: Cardiovascular;  Laterality: N/A;   ABDOMINAL AORTOGRAM W/LOWER EXTREMITY N/A 12/15/2020   Procedure: ABDOMINAL AORTOGRAM W/LOWER EXTREMITY;  Surgeon: Nada Libman, MD;  Location: [facility];  Service: Cardiovascular;  Laterality: N/A;   AMPUTATION Bilateral 09/20/2017   Procedure: BILATERAL GREAT TOE AMPUTATIONS INCLUDING METATARSAL HEADS;  Surgeon: Nada Libman, MD;  Location: [facility] OR;  Service: Vascular;  Laterality: Bilateral;   AMPUTATION Right 03/01/2019   Procedure: AMPUTATION TOES;  Surgeon: Chuck Hint, MD;  Location: [facility] OR;  Service: Vascular;  Laterality: Right;   AMPUTATION Right 05/29/2019   Procedure: AMPUTATION RIGHT THIRD TOE;  Surgeon: Nada Libman, MD;  Location: [facility] OR;  Service: Vascular;  Laterality: Right;  AMPUTATION Right 09/26/2019   Procedure: AMPUTATION RIGHT FOURTH TOE AND FIFTH TOES;  Surgeon: Nada Libman, MD;  Location: [facility] OR;  Service: Vascular;  Laterality: Right;   ANGIOPLASTY Right 09/20/2017   Procedure: ANGIOPLASTY RIGHT TIBIAL ARTERY;  Surgeon: Nada Libman, MD;  Location: [facility] OR;  Service: Vascular;  Laterality: Right;   APPENDECTOMY     APPLICATION OF WOUND VAC  12/17/2020   Procedure: APPLICATION OF WOUND VAC;  Surgeon: Nada Libman, MD;  Location: [facility] OR;  Service: Vascular;;   BACK SURGERY     CARDIAC CATHETERIZATION  1980s; 2012;   CATARACT EXTRACTION Left 2004   COLONOSCOPY  2004   FLEISHMAN TICS   COLONOSCOPY WITH PROPOFOL N/A 11/28/2018   Procedure: COLONOSCOPY WITH PROPOFOL;  Surgeon: Malissa Hippo, MD;  [facility]: [facility];  Service: Endoscopy;  Laterality: N/A;   CORONARY ANGIOPLASTY WITH STENT PLACEMENT  05/18/2017   "3 stents"   CORONARY STENT INTERVENTION N/A 05/18/2017   Procedure: CORONARY STENT INTERVENTION;  Surgeon: Corky Crafts, MD;  Location: [facility] INVASIVE CV LAB;  Service: Cardiovascular;   Laterality: N/A;   ESOPHAGOGASTRODUODENOSCOPY (EGD) WITH PROPOFOL N/A 11/20/2017   Procedure: ESOPHAGOGASTRODUODENOSCOPY (EGD) WITH PROPOFOL;  Surgeon: Hilarie Fredrickson, MD;  Location: [facility] ENDOSCOPY;  Service: Gastroenterology;  Laterality: N/A;   ESOPHAGOGASTRODUODENOSCOPY (EGD) WITH PROPOFOL N/A 11/28/2018   Procedure: ESOPHAGOGASTRODUODENOSCOPY (EGD) WITH PROPOFOL;  Surgeon: Malissa Hippo, MD;  [facility];  Service: Endoscopy;  Laterality: N/A;   ESOPHAGOGASTRODUODENOSCOPY (EGD) WITH PROPOFOL N/A 07/12/2019   Procedure: ESOPHAGOGASTRODUODENOSCOPY (EGD) WITH PROPOFOL;  Surgeon: Malissa Hippo, MD;  [facility];  Service: Endoscopy;  Laterality: N/A;  125   GIVENS CAPSULE STUDY N/A 12/17/2018   Procedure: GIVENS CAPSULE STUDY;  Surgeon: Malissa Hippo, MD;  [facility];  Service: Endoscopy;  Laterality: N/A;  7:30am   I & D EXTREMITY Right 12/17/2017   Procedure: IRRIGATION AND DEBRIDEMENT RIGHT GREAT TOE;  Surgeon: Nada Libman, MD;  [facility]: [facility];  Service: Vascular;  Laterality: Right;   INCISION AND DRAINAGE Right 12/17/2020   Procedure: INCISION AND DRAINAGE OF RIGHT FOOT;  Surgeon: Nada Libman, MD;  Location: [facility];  Service: Vascular;  Laterality: Right;   JOINT REPLACEMENT     LEFT HEART CATH AND CORONARY ANGIOGRAPHY N/A 05/18/2017   Procedure: LEFT HEART CATH AND CORONARY ANGIOGRAPHY;  Surgeon: Corky Crafts, MD;  Location: [facility] INVASIVE CV LAB;  Service: Cardiovascular;  Laterality: N/A;   LOWER EXTREMITY ANGIOGRAM Right 09/20/2017   Procedure: RIGHT LOWER LEG  ANGIOGRAM;  Surgeon: Nada Libman, MD;  Location: [facility] OR;  Service: Vascular;  Laterality: Right;   LUMBAR FUSION  2002   L3, 4 L4, 5 L5 S1 Fused by Dr. Trey Sailors   PERIPHERAL VASCULAR BALLOON ANGIOPLASTY Left 09/15/2017   Procedure: PERIPHERAL VASCULAR BALLOON ANGIOPLASTY;  Surgeon: Nada Libman, MD;  [facility];  Service: Cardiovascular;  Laterality: Left;   PTA of Peroneal & Posterior Tibial   PERIPHERAL VASCULAR INTERVENTION Right 12/15/2020   Procedure: PERIPHERAL VASCULAR INTERVENTION;  Surgeon: Nada Libman, MD;  [facility];  Service: Cardiovascular;  Laterality: Right;  SFA   POLYPECTOMY  11/28/2018   Procedure: POLYPECTOMY;  Surgeon: Malissa Hippo, MD;  [facility];  Service: Endoscopy;;  duodenum   POSTERIOR LUMBAR FUSION     RHINOPLASTY     RIGHT HEART CATH N/A 05/18/2017  Procedure: RIGHT HEART CATH;  Surgeon: Corky Crafts, MD;  Location: [facility] INVASIVE CV LAB;  Service: Cardiovascular;  Laterality: N/A;   TEE WITHOUT CARDIOVERSION N/A 09/18/2017   Procedure: TRANSESOPHAGEAL ECHOCARDIOGRAM (TEE);  Surgeon: Pricilla Riffle, MD;  Location: [facility] ENDOSCOPY;  Service: Cardiovascular;  Laterality: N/A;   TOTAL KNEE ARTHROPLASTY Bilateral    TRANSMETATARSAL AMPUTATION Right 02/04/2020   Procedure: RIGHT TRANSMETATARSAL AMPUTATION;  Surgeon: Nada Libman, MD;  Location: [facility] OR;  Service: Vascular;  Laterality: Right;   TRANSMETATARSAL AMPUTATION Left 11/12/2021   Procedure: LEFT TRANSMETATARSAL AMPUTATION;  Surgeon: Maeola Harman, MD;  Location: [facility] OR;  Service: Vascular;  Laterality: Left;   TRANSURETHRAL RESECTION OF PROSTATE  2001   Mike Wright    Social History: Social History   Socioeconomic History   Marital status: Married    Spouse name: patsy   Number of children: Not on file   Years of education: Not on file   Highest education level: Not on file  Occupational History   Not on file  Tobacco Use   Smoking status: Former    Packs/day: 2.00    Years: 30.00    Additional pack years: 0.00    Total pack years: 60.00    Types: Cigarettes    Start date: 03/28/1953    Quit date: 03/29/1983    Years since quitting: 39.5   Smokeless tobacco: Never  Vaping Use   Vaping Use: Never used  Substance and Sexual Activity   Alcohol use: Not Currently    Comment: 05/18/2017 'I drink a beer q  yr"   Drug use: No   Sexual activity: Not Currently  Other Topics Concern   Not on file  Social History Narrative   Patient lives at home with his spouse.    Social Determinants of Health   Financial Resource Strain: Low Risk  (08/30/2022)   Overall Financial Resource Strain (CARDIA)    Difficulty of Paying Living Expenses: Not hard at all  Food Insecurity: No Food Insecurity (08/29/2022)   Hunger Vital Sign    Worried About Running Out of Food in the Last Year: Never true    Ran Out of Food in the Last Year: Never true  Transportation Needs: No Transportation Needs (08/30/2022)   PRAPARE - Administrator, Civil Service (Medical): No    Lack of Transportation (Non-Medical): No  Physical Activity: Inactive (08/03/2022)   Exercise Vital Sign    Days of Exercise per Week: 0 days    Minutes of Exercise per Session: 0 min  Stress: No Stress Concern Present (07/29/2019)   [employer] of Occupational Health - Occupational Stress Questionnaire    Feeling of Stress : Not at all  Social Connections: Not on file  Intimate Partner Violence: Not At Risk (08/29/2022)   Humiliation, Afraid, Rape, and Kick questionnaire    Fear of Current or Ex-Partner: No    Emotionally Abused: No    Physically Abused: No    Sexually Abused: No    Family History: Family History  Problem Relation Age of Onset   Heart attack Father 34   COPD Father    COPD Mother    Heart disease Mother    Diabetes Mother    Hypertension Sister    CVA Sister    Diabetes Sister    Multiple sclerosis Sister     Current Medications:  Current Outpatient Medications:    Accu-Chek Softclix Lancets lancets, , Disp: , Rfl:    acetaminophen (  TYLENOL) 500 MG tablet, Take 1 tablet (500 mg total) by mouth every 6 (six) hours as needed for mild pain or headache. (Patient taking differently: Take 1,000 mg by mouth at bedtime.), Disp: 30 tablet, Rfl: 0   Alcohol Swabs (B-D SINGLE USE SWABS REGULAR) PADS, , Disp: , Rfl:     amLODipine (NORVASC) 2.5 MG tablet, Take 2.5 mg by mouth daily., Disp: , Rfl:    apixaban (ELIQUIS) 2.5 MG TABS tablet, Take 1 tablet (2.5 mg total) by mouth 2 (two) times daily. (Patient taking differently: Take 5 mg by mouth 2 (two) times daily.), Disp: 60 tablet, Rfl: 11   atorvastatin (LIPITOR) 80 MG tablet, Take 80 mg by mouth every evening. , Disp: , Rfl:    Blood Glucose Monitoring Suppl (ACCU-CHEK GUIDE) w/Device KIT, , Disp: , Rfl:    Cholecalciferol (VITAMIN D3) 50 MCG (2000 UT) TABS, Take 2,000 Units by mouth 2 (two) times daily., Disp: , Rfl:    clopidogrel (PLAVIX) 75 MG tablet, Take 1 tablet (75 mg total) by mouth daily with breakfast., Disp: 30 tablet, Rfl: 2   Cyanocobalamin (B-12) 2500 MCG TABS, Take 2,500 mcg by mouth daily., Disp: , Rfl:    diazepam (VALIUM) 2 MG tablet, Take 2 mg by mouth daily., Disp: , Rfl:    DROPLET INSULIN SYRINGE 31G X 5/16" 1 ML MISC, , Disp: , Rfl:    folic acid (FOLVITE) 1 MG tablet, Take 1 tablet (1 mg total) by mouth daily., Disp: 30 tablet, Rfl: 1   insulin glargine (LANTUS) 100 UNIT/ML injection, Inject 30 Units into the skin 2 (two) times daily as needed., Disp: , Rfl:    ipratropium-albuterol (DUONEB) 0.5-2.5 (3) MG/3ML SOLN, Take 3 mLs by nebulization every other day., Disp: , Rfl:    isosorbide mononitrate (IMDUR) 60 MG 24 hr tablet, Take 0.5 tablets (30 mg total) by mouth 2 (two) times daily. (Patient taking differently: Take 60 mg by mouth 2 (two) times daily.), Disp: 90 tablet, Rfl: 1   nitroGLYCERIN (NITROSTAT) 0.4 MG SL tablet, Place 1 tablet (0.4 mg total) under the tongue every 5 (five) minutes as needed for chest pain., Disp: 25 tablet, Rfl: 3   OXYGEN, Inhale 2 L/min into the lungs See admin instructions. 2 L/min of oxygen at bedtime and during the day as needed for shortness of breath, Disp: , Rfl:    pantoprazole (PROTONIX) 40 MG tablet, Take 1 tablet (40 mg total) by mouth 2 (two) times daily. To protect stomach while taking  multiple blood thinners. (Patient taking differently: Take 40 mg by mouth daily. To protect stomach while taking multiple blood thinners.), Disp: 60 tablet, Rfl: 2   potassium chloride SA (KLOR-CON M) 20 MEQ tablet, Take 1 tablet (20 mEq total) by mouth 2 (two) times daily., Disp: 30 tablet, Rfl: 1   SPIRIVA HANDIHALER 18 MCG inhalation capsule, Place 1 capsule into inhaler and inhale daily after breakfast. , Disp: , Rfl:    torsemide (DEMADEX) 20 MG tablet, Take 1 tablet (20 mg total) by mouth 2 (two) times daily., Disp: 180 tablet, Rfl: 1   traZODone (DESYREL) 100 MG tablet, Take 200 mg by mouth at bedtime., Disp: , Rfl:    TRUE METRIX BLOOD GLUCOSE TEST test strip, , Disp: , Rfl:    Allergies: Allergies  Allergen Reactions   Codeine Shortness Of Breath   Doxycycline Swelling    Swelling and numbness in lips and face. Swelling improved after stopping. Reports still experiences numbness in bottom lip.  Feraheme [Ferumoxytol] Other (See Comments)    Diaphoretic, chest pain   Heparin Other (See Comments)    +HIT,  Severe bleeding (with heparin drip & large doses), tolerates low doses   Iron Shortness Of Breath    Patient severe reaction to IV iron and does not tolerate oral formulations either   Losartan Swelling   Oxycodone Other (See Comments)    "Made me act out of my mind" Mental status changes- hallucinations   Latex Rash    REVIEW OF SYSTEMS:   Review of Systems  Constitutional:  Positive for fatigue.  Musculoskeletal:  Positive for arthralgias and myalgias (Right elbow).     VITALS:   There were no vitals taken for this visit.  Wt Readings from Last 3 Encounters:  08/29/22 176 lb 14.4 oz (80.2 kg)  08/15/22 176 lb 3.2 oz (79.9 kg)  07/27/22 178 lb 5.6 oz (80.9 kg)    There is no height or weight on file to calculate BMI.   PHYSICAL EXAM:   Physical Exam Vitals reviewed.  Constitutional:      Appearance: Normal appearance.  Cardiovascular:     Rate and  Rhythm: Normal rate and regular rhythm.  Musculoskeletal:     Right elbow: Swelling present. Decreased range of motion. Tenderness present.     Right lower leg: Edema present.  Neurological:     Mental Status: He is alert.     LABS:      Latest Ref Rng & Units 08/29/2022    1:45 PM 08/23/2022   10:29 AM 07/25/2022    4:27 AM  CBC  WBC 4.0 - 10.5 K/uL 6.6  6.1  6.2   Hemoglobin 13.0 - 17.0 g/dL 87.5  64.3  9.7   Hematocrit 39.0 - 52.0 % 34.9  34.3  32.2   Platelets 150 - 400 K/uL 110  87  103       Latest Ref Rng & Units 08/23/2022   10:29 AM 07/27/2022    3:38 AM 07/26/2022    4:18 AM  CMP  Glucose 70 - 99 mg/dL 329  518  841   BUN 8 - 27 mg/dL 28  27  34   Creatinine 0.76 - 1.27 mg/dL 6.60  6.30  1.60   Sodium 134 - 144 mmol/L 140  139  136   Potassium 3.5 - 5.2 mmol/L 4.3  3.6  3.3   Chloride 96 - 106 mmol/L 101  99  97   CO2 20 - 29 mmol/L 23  31  29    Calcium 8.6 - 10.2 mg/dL 8.9  8.4  8.2   Total Protein 6.5 - 8.1 g/dL  6.2  6.1   Total Bilirubin 0.3 - 1.2 mg/dL  1.2  1.4   Alkaline Phos 38 - 126 U/L  80  75   AST 15 - 41 U/L  21  19   ALT 0 - 44 U/L  13  11      No results found for: "CEA1", "CEA" / No results found for: "CEA1", "CEA" No results found for: "PSA1" No results found for: "CAN199" No results found for: "CAN125"  Lab Results  Component Value Date   TOTALPROTELP 6.7 08/29/2022   TOTALPROTELP 6.7 08/29/2022   ALBUMINELP 4.0 08/29/2022   A1GS 0.3 08/29/2022   A2GS 0.7 08/29/2022   BETS 0.8 08/29/2022   GAMS 0.9 08/29/2022   MSPIKE Not Observed 08/29/2022   SPEI Comment 08/29/2022   Lab Results  Component Value Date  TIBC 378 08/29/2022   TIBC 350 07/19/2022   TIBC 407 11/27/2018   FERRITIN 16 (L) 08/29/2022   FERRITIN 9 (L) 07/19/2022   FERRITIN 11 (L) 11/27/2018   IRONPCTSAT 14 (L) 08/29/2022   IRONPCTSAT 3 (L) 07/19/2022   IRONPCTSAT 6 (L) 11/27/2018   Lab Results  Component Value Date   LDH 161 08/29/2022   LDH 177 01/04/2018      STUDIES:   US SPLEEN (ABDOMEN LIMITED)  Result Date: 09/12/2022 CLINICAL DATA:  Thrombocytopenia.  Microcytic anemia. EXAM: ULTRASOUND ABDOMEN LIMITED COMPARISON:  Abdominal ultrasound November 21, 2017 FINDINGS: The spleen measures 16.4 x 6.2 x 16.4 cm with a volume of 868 cc. The splenic volume was 734 cc on the November 21, 2017 abdominal ultrasound. IMPRESSION: Splenomegaly as above.  Worsening splenomegaly since November 21, 2017 Electronically Signed   By: Gerome Sam III M.D.   On: 09/12/2022 11:38

## 2022-09-26 ENCOUNTER — Telehealth: Payer: Self-pay | Admitting: *Deleted

## 2022-09-26 NOTE — Telephone Encounter (Signed)
Lesle Chris, LPN 09/03/6293  2:84 PM EDT Back to Top    Notified, copy to pcp.

## 2022-09-26 NOTE — Telephone Encounter (Signed)
-----   Message from Antoine Poche, MD sent at 09/26/2022  9:39 AM EDT ----- Some low HRs at times on monitor but not to a range of high concern at this time and just something to continue to monitor  Dominga Ferry MD

## 2022-10-07 ENCOUNTER — Ambulatory Visit: Payer: Medicare HMO | Admitting: Internal Medicine

## 2022-10-07 DIAGNOSIS — Z7189 Other specified counseling: Secondary | ICD-10-CM | POA: Diagnosis not present

## 2022-10-07 DIAGNOSIS — Z0000 Encounter for general adult medical examination without abnormal findings: Secondary | ICD-10-CM | POA: Diagnosis not present

## 2022-10-07 DIAGNOSIS — I700 Atherosclerosis of aorta: Secondary | ICD-10-CM | POA: Diagnosis not present

## 2022-10-07 DIAGNOSIS — Z1339 Encounter for screening examination for other mental health and behavioral disorders: Secondary | ICD-10-CM | POA: Diagnosis not present

## 2022-10-07 DIAGNOSIS — I5032 Chronic diastolic (congestive) heart failure: Secondary | ICD-10-CM | POA: Diagnosis not present

## 2022-10-07 DIAGNOSIS — I10 Essential (primary) hypertension: Secondary | ICD-10-CM | POA: Diagnosis not present

## 2022-10-07 DIAGNOSIS — I739 Peripheral vascular disease, unspecified: Secondary | ICD-10-CM | POA: Diagnosis not present

## 2022-10-07 DIAGNOSIS — Z299 Encounter for prophylactic measures, unspecified: Secondary | ICD-10-CM | POA: Diagnosis not present

## 2022-10-07 DIAGNOSIS — Z1331 Encounter for screening for depression: Secondary | ICD-10-CM | POA: Diagnosis not present

## 2022-10-11 DIAGNOSIS — K8020 Calculus of gallbladder without cholecystitis without obstruction: Secondary | ICD-10-CM | POA: Diagnosis not present

## 2022-10-11 DIAGNOSIS — R161 Splenomegaly, not elsewhere classified: Secondary | ICD-10-CM | POA: Diagnosis not present

## 2022-10-11 DIAGNOSIS — R109 Unspecified abdominal pain: Secondary | ICD-10-CM | POA: Diagnosis not present

## 2022-10-14 ENCOUNTER — Encounter: Payer: Self-pay | Admitting: *Deleted

## 2022-10-17 ENCOUNTER — Encounter: Payer: Self-pay | Admitting: Nurse Practitioner

## 2022-10-17 ENCOUNTER — Ambulatory Visit: Payer: Medicare HMO | Attending: Nurse Practitioner | Admitting: Nurse Practitioner

## 2022-10-17 VITALS — BP 126/52 | HR 52 | Ht 67.0 in | Wt 161.4 lb

## 2022-10-17 DIAGNOSIS — I4891 Unspecified atrial fibrillation: Secondary | ICD-10-CM | POA: Diagnosis not present

## 2022-10-17 DIAGNOSIS — R001 Bradycardia, unspecified: Secondary | ICD-10-CM | POA: Diagnosis not present

## 2022-10-17 DIAGNOSIS — I350 Nonrheumatic aortic (valve) stenosis: Secondary | ICD-10-CM

## 2022-10-17 DIAGNOSIS — I5032 Chronic diastolic (congestive) heart failure: Secondary | ICD-10-CM

## 2022-10-17 DIAGNOSIS — I739 Peripheral vascular disease, unspecified: Secondary | ICD-10-CM | POA: Diagnosis not present

## 2022-10-17 DIAGNOSIS — I2510 Atherosclerotic heart disease of native coronary artery without angina pectoris: Secondary | ICD-10-CM

## 2022-10-17 DIAGNOSIS — R5383 Other fatigue: Secondary | ICD-10-CM

## 2022-10-17 DIAGNOSIS — I10 Essential (primary) hypertension: Secondary | ICD-10-CM | POA: Diagnosis not present

## 2022-10-17 DIAGNOSIS — N1831 Chronic kidney disease, stage 3a: Secondary | ICD-10-CM

## 2022-10-17 DIAGNOSIS — E785 Hyperlipidemia, unspecified: Secondary | ICD-10-CM | POA: Diagnosis not present

## 2022-10-17 NOTE — Progress Notes (Signed)
Office Visit    Patient Name: Mike Wright Date of Encounter: 10/17/2022 PCP:  [name], MD [facility]  Cardiologist:  Dina Rich, MD  Advanced Practice Provider:  No care team member to display Electrophysiologist:  None   Chief Complaint    Mike Wright is a 89 y.o. male with a hx of CAD, HFpEF, history of A-fib, history of PSVT/palpitations, PAD (previous history of gangrene and amputations), hyperlipidemia, aortic valve stenosis, and chronic anemia, CKD, who presents today for scheduled follow-up.  Previous cardiovascular history includes nonobstructive CAD seen via cardiac catheterization 2012.  NST in 2017 was negative for ischemia.  Echocardiogram around that time revealed EF 65 to 70%.  Repeat Lexiscan in 2018 was low risk study.  Underwent cardiac catheterization in 2019, received drug-eluting stent to 75% stenosis D1, DES to 75% mid to distal LAD, DES to 70% proximal LAD.  RHC findings listed below.  Was discharged on triple therapy for 1 month, then was instructed to stop aspirin.  Was admitted later that year for anemia and chest pain.  Workup negative for ACS.  The following months, admitted with chest pain and dyspnea.  CT was negative for PE, ACS workup was negative.  Diuresed 6 L with resolution of symptoms.  Underwent NST in 2021 revealed small, mild intensity mid to basal inferior/inferoseptal defect, consistent with soft tissue attenuation, no significant ischemic territories.  Last seen by Dr. Dina Rich on July 18, 2022.  With found to be severely volume overloaded, weight was up 19 pounds since February.  Based on his symptoms, it was recommended he present to [facility] ED for further evaluation and for diuresis.  Admitted to [facility] for acute on chronic CHF exacerbation.  Did receive IV diuresis, transition to p.o. torsemide twice daily, also placed on potassium supplementation.  TTE was updated-see report below.  CXR  revealed probable mild interstitial edema.  Was referred to heart failure clinic outpatient.  Hospital course complicated by sundowning, encephalopathy, declining condition was discussed with family.  Outpatient follow-up with cardiology was arranged.  Today he presents for outpatient follow-up. Overall doing well.   Weight is down from last month. Wears 3 L O2 PRN.  Denies any chest pain or worsening shortness of breath, palpitations, syncope, presyncope, dizziness, orthopnea, PND, swelling or significant weight changes, acute bleeding, or claudication.  EKGs/Labs/Other Studies Reviewed:   The following studies were reviewed today:   EKG:  EKG is not ordered today.    Cardiac monitor 08/30/2022:    3 day monitor   100% afib burden rates 31-92, avg HR 52. The lower rates were primarily in the early AM hours.   Rare ventricular ectopy in the form of isolated PVCs, couplets   No symptoms reported     Patch Wear Time:  2 days and 23 hours (2024-05-11T13:17:54-0400 to 2024-05-14T12:27:16-0400)   Atrial Fibrillation occurred continuously (100% burden), ranging from 31-92 bpm (avg of 52 bpm). Isolated VEs were rare (<1.0%), VE Couplets were rare (<1.0%), and no VE Triplets were present. MD notification criteria for Slow Atrial Fibrillation met -  report posted prior to notification per account request (VN).  Echo 06/2022:   1. Left ventricular ejection fraction, by estimation, is 70 to 75%. The  left ventricle has hyperdynamic function. The left ventricle has no  regional wall motion abnormalities. There is mild left ventricular  hypertrophy. Left ventricular diastolic  function could not be evaluated due to atrial flutter.   2.  Right ventricular systolic function is normal. The right ventricular  size is normal. There is severely elevated pulmonary artery systolic  pressure. The estimated right ventricular systolic pressure is 73.1 mmHg.   3. Left atrial size was severely dilated.   4. Right  atrial size was severely dilated.   5. The mitral valve is abnormal. No evidence of mitral valve  regurgitation. No evidence of mitral stenosis. Severe mitral annular  calcification.   6. The tricuspid valve is abnormal. Tricuspid valve regurgitation is mild  to moderate.   7. The aortic valve is calcified. Aortic valve regurgitation is mild.  Moderate to severe aortic valve stenosis. Aortic valve area, by VTI  measures 1.13 cm. Aortic valve mean gradient measures 33.0 mmHg. Aortic  valve Vmax measures 4.03 m/s. AV DVI 0.33.   8. The inferior vena cava is dilated in size with <50% respiratory  variability, suggesting right atrial pressure of 15 mmHg.   9. Increased flow velocities may be secondary to anemia, thyrotoxicosis,  hyperdynamic or high flow state.   Comparison(s): Changes from prior study are noted. AS worsened from  moderate (DVI 0.33, Vmax 3.46 m/sec, mean PG 25 mm Hg) in 10/2021 to  moderate-severe (DVI 0.33, Vmax 4.03 m/sec, mean PG 33 mm Hg) now.  Risk Assessment/Calculations:   CHA2DS2-VASc Score = 6  This indicates a 9.7% annual risk of stroke. The patient's score is based upon: CHF History: 1 HTN History: 1 Diabetes History: 1 Stroke History: 0 Vascular Disease History: 1 Age Score: 2 Gender Score: 0  Review of Systems    All other systems reviewed and are otherwise negative except as noted above.  Physical Exam    VS:  BP (!) 126/52   Pulse (!) 52   Ht 5\' 7"  (1.702 m)   Wt 161 lb 6.4 oz (73.2 kg)   SpO2 100%   BMI 25.28 kg/m  , BMI Body mass index is 25.28 kg/m.  Wt Readings from Last 3 Encounters:  10/17/22 161 lb 6.4 oz (73.2 kg)  08/29/22 176 lb 14.4 oz (80.2 kg)  08/15/22 176 lb 3.2 oz (79.9 kg)     GEN: Well nourished, well developed, in no acute distress, wearing chronic O2. HEENT: normal. Neck: Supple, no JVD, carotid bruits, or masses. Cardiac: S1/S2, slow rate and regular rhythm, no murmurs, rubs, or gallops. No clubbing, cyanosis,  edema.  Radials/PT 2+ and equal bilaterally.  Respiratory:  Respirations regular and unlabored, clear to auscultation bilaterally. MS: No deformity or atrophy. Skin: Pale, warm and dry, no rash. Neuro:  Strength and sensation are intact. Psych: Normal affect.  Assessment & Plan    HFpEF Stage C, NYHA class I-II. Echo 06/2022 revealed 70-75% EF. Euvolemic and well compensated on exam. Continue current medication regimen. Low sodium diet, fluid restriction <2L, and daily weights encouraged. Educated to contact our office for weight gain of 2 lbs overnight or 5 lbs in one week. Follow-up with HF clinic as scheduled.   CAD Stable with no anginal symptoms. No indication for ischemic evaluation. Not on ASA d/t being on Eliquis. Continue Plavix, Imdur, and NTG PRN. Heart healthy diet and regular cardiovascular exercise encouraged. ED precautions discussed.   A-fib, bradycardia Denies any tachycardia or palpitations. HR on exam, 52 bpm, asymptomatic. Not on any HR lowering medications. Continue Eliquis, denies any bleeding issues and on appropriate dosage.   PAD, HLD Normal ABI's 04/2022. Closely followed by Dr. Myra Gianotti. LDL 26 09/2021. Continue atorvastatin. Heart healthy diet and regular cardiovascular exercise  encouraged.   Aortic valve stenosis, fatigue Moderate to severe aortic valve stenosis on Echo 06/2022 with mean gradient measuring 33 mmHg. Previous Echo 10/2021 revealed moderate aortic valve stenosis with mean gradient of 25 mmHg. Pt asymptomatic overall. Continues to note fatigue - etiology multifactorial. Have set up appt with Dr. Excell Seltzer of Structural Heart Team to discuss about options, have previously referred for progressive aortic valve stenosis. Care and ED precautions discussed.   6. HTN BP stable. Discussed to monitor BP at home at least 2 hours after medications and sitting for 5-10 minutes. No change in medication regimen at this time. Heart healthy diet and regular cardiovascular  exercise encouraged.   7. CKD stage 3a Most recent sCr 1.39 with eGFR at 49. Avoid nephrotoxic agents. No medication changes at this time. Continue to follow with PCP.   Disposition: Follow up in 2 month(s) with Dina Rich, MD or APP.   Signed, Sharlene Dory, NP 10/18/2022, 12:25 PM [facility]

## 2022-10-17 NOTE — Patient Instructions (Addendum)
Medication Instructions:  Your physician recommends that you continue on your current medications as directed. Please refer to the Current Medication list given to you today.  Labwork: none  Testing/Procedures: none  Follow-Up: Your physician recommends that you schedule a follow-up appointment in: 2-3 Months with Philis Nettle   Any Other Special Instructions Will Be Listed Below (If Applicable).  If you need a refill on your cardiac medications before your next appointment, please call your pharmacy.

## 2022-10-18 ENCOUNTER — Inpatient Hospital Stay: Payer: Medicare HMO | Attending: Hematology

## 2022-10-18 DIAGNOSIS — D696 Thrombocytopenia, unspecified: Secondary | ICD-10-CM | POA: Diagnosis not present

## 2022-10-18 DIAGNOSIS — D509 Iron deficiency anemia, unspecified: Secondary | ICD-10-CM | POA: Insufficient documentation

## 2022-10-18 DIAGNOSIS — D500 Iron deficiency anemia secondary to blood loss (chronic): Secondary | ICD-10-CM

## 2022-10-18 LAB — CBC WITH DIFFERENTIAL/PLATELET
Abs Immature Granulocytes: 0.05 10*3/uL (ref 0.00–0.07)
Basophils Absolute: 0.1 10*3/uL (ref 0.0–0.1)
Basophils Relative: 1 %
Eosinophils Absolute: 0.2 10*3/uL (ref 0.0–0.5)
Eosinophils Relative: 3 %
HCT: 41.1 % (ref 39.0–52.0)
Hemoglobin: 13 g/dL (ref 13.0–17.0)
Immature Granulocytes: 1 %
Lymphocytes Relative: 18 %
Lymphs Abs: 1.3 10*3/uL (ref 0.7–4.0)
MCH: 26.4 pg (ref 26.0–34.0)
MCHC: 31.6 g/dL (ref 30.0–36.0)
MCV: 83.5 fL (ref 80.0–100.0)
Monocytes Absolute: 1.2 10*3/uL — ABNORMAL HIGH (ref 0.1–1.0)
Monocytes Relative: 17 %
Neutro Abs: 4.2 10*3/uL (ref 1.7–7.7)
Neutrophils Relative %: 60 %
Platelets: 117 10*3/uL — ABNORMAL LOW (ref 150–400)
RBC: 4.92 MIL/uL (ref 4.22–5.81)
RDW: 25.3 % — ABNORMAL HIGH (ref 11.5–15.5)
WBC: 6.9 10*3/uL (ref 4.0–10.5)
nRBC: 0 % (ref 0.0–0.2)

## 2022-10-18 LAB — IRON AND TIBC
Iron: 54 ug/dL (ref 45–182)
Saturation Ratios: 24 % (ref 17.9–39.5)
TIBC: 221 ug/dL — ABNORMAL LOW (ref 250–450)
UIBC: 167 ug/dL

## 2022-10-18 LAB — FERRITIN: Ferritin: 120 ng/mL (ref 24–336)

## 2022-10-20 ENCOUNTER — Ambulatory Visit: Payer: Self-pay | Admitting: *Deleted

## 2022-10-20 ENCOUNTER — Encounter: Payer: Self-pay | Admitting: *Deleted

## 2022-10-21 DIAGNOSIS — K8020 Calculus of gallbladder without cholecystitis without obstruction: Secondary | ICD-10-CM | POA: Diagnosis not present

## 2022-10-21 DIAGNOSIS — I10 Essential (primary) hypertension: Secondary | ICD-10-CM | POA: Diagnosis not present

## 2022-10-21 DIAGNOSIS — M549 Dorsalgia, unspecified: Secondary | ICD-10-CM | POA: Diagnosis not present

## 2022-10-21 DIAGNOSIS — J449 Chronic obstructive pulmonary disease, unspecified: Secondary | ICD-10-CM | POA: Diagnosis not present

## 2022-10-21 DIAGNOSIS — Z299 Encounter for prophylactic measures, unspecified: Secondary | ICD-10-CM | POA: Diagnosis not present

## 2022-10-21 DIAGNOSIS — E1165 Type 2 diabetes mellitus with hyperglycemia: Secondary | ICD-10-CM | POA: Diagnosis not present

## 2022-10-21 NOTE — Patient Outreach (Signed)
Care Coordination   Follow Up Visit Note   10/20/2022 Name: Mike Wright MRN: [id number] DOB: 07-14-1933  Mike Wright is a 89 y.o. year old male who sees [name], MD for primary care. I spoke with  [name] by phone today.  Patient is oriented is an 89 year old male with hx of CHF, COPD, and anemia. He is oriented x 3. He complains of "just not feeling well." He is objectively more weak sounding and SOB of than usual. He reports being more SOB over the past few weeks and has a productive cough with mostly white phlegm but it is green or brown at times and is sometimes hard to clear. May be normal variant of COPD but concern for infection given general feeling of being unwell and inc in SOB. He does a PCP visit tomorrow and an unremarkable cardiology visit on 10/17/22. Denies fever, chills, lower extremity edema or swelling in abdomen. He does complain of mid back pain and a feeling of a "brick" on the right side. Had a recent US of his spleen that showed enlargement. He wears O2 at 3L PRN. Encouraged to wear at 2L or 3L continuously until he sees PCP tomorrow since he is symptomatically SOB.  What matters to the patients health and wellness today?  Improving weakness and feeling better    Goals Addressed             This Visit's Progress    Improve Balance and Mobility   On track    Care Coordination Goals: Patient will follow-up with PCP and specialists as planned and sooner if needed 10/21/22 with Dr Sherryll Burger Patient will continue to practice fall precautions Patient will use rolling walker for ambulation Patient will notify provider of any falls Patient will call RN Care Coordinator with any care coordination or resource needs [phone]     Manage Anemia   On track    Care Coordination Goals: Patient will follow-up with PCP and hematologist as planned and sooner if needed Patient will monitor for signs of worsening anemia and notify hematologist if he develops  any Patient will seek emergency medical attention if needed Patient will notify provider of any obvious signs of bleeding Patient will take medications as prescribed Patient will continue to eat food rich in iron. He does tolerate these without a reaction. Patient will reach out to RN Care Coordinator [phone] with any care coordination or resource needs     Manage COPD       Care Coordination Goals: Patient will keep all follow-up PCP and/or pulmonary appointments Dr Sherryll Burger 10/21/22 Patient will call provider with any new or worsening symptoms Patient will follow COPD Action Zones  Handout mailed Patient will use medications as directed by PCP or pulmonologist  Mucinex for now to help thin secretions Patient will increase liquids to help thin secretions but maintain a fluid restriction of 2L per day Provided will use infection prevention strategies to reduce risk of respiratory infection Patient will state understanding of the importance of adequate rest and management of fatigue with COPD Patient will continue to use O2 at 2-3 L per minute per [place] via floor concentrator Using PRN for now. Encouraged to use continuously until he sees PCP on 10/21/22 since he is more short of breath than usual Encouraged to seek emergency medical attention if needed O2 sats <90%, fever, chills, cannot get his breath, chest pain (these are some symptoms but not an exhaustive list. Patient encouraged to  seek medical attention if he feels he needs to) Patient will call RN Care Coordinator [phone] with any care coordination or resource needs      Manage Edema   On track    Care Coordination Goals  Patient will continue monitor lower extremities and abdomen for swelling/edema and report any worsening to cardiologist Patient will take Torsemide as prescribed Patient will continue to monitor and record weights daily and will reach out to cardio with any weight gain of >2 lbs overnight of >5 lbs in one  week Patient will prop legs up when at rest Patient will check and record blood pressure and reach out to cardio with any readings outside of recommended range Patient will check and record O2 sats and reach out to provider with any readings outside of recommended range Patient will keep appt with cardiologist on 10/28/22 and reach out sooner if necessary Patient will reach out to RN Care Coordinator as needed [phone]          SDOH assessments and interventions completed:  Yes  SDOH Interventions Today    Flowsheet Row Most Recent Value  SDOH Interventions   Transportation Interventions Intervention Not Indicated        Care Coordination Interventions:  Yes, provided  Interventions Today    Flowsheet Row Most Recent Value  Chronic Disease   Chronic disease during today's visit Chronic Obstructive Pulmonary Disease (COPD), Congestive Heart Failure (CHF)  General Interventions   General Interventions Discussed/Reviewed General Interventions Discussed, General Interventions Reviewed, Durable Medical Equipment (DME), Doctor Visits  [O2 sat >90% while at rest. Denies LE or abd edema. Wt stable at 161. Denies orthopnea but has been more SOB the past few wks, especially w/ exertion. Reports white colored phlegm that is sometimes yellow or brown,  Trouble clearing it at times.]  Doctor Visits Discussed/Reviewed Doctor Visits Discussed, Doctor Visits Reviewed, PCP, Specialist  Durable Medical Equipment (DME) Oxygen, Walker, Other  [Pulse Ox. Wear O2 continuously at 2L until appt with PCP on 7/26 due to increased SOB]  PCP/Specialist Visits Compliance with follow-up visit  [PCP on 10/21/22, Hematology on 10/25/22, Cardiology on 10/28/22]  Exercise Interventions   Exercise Discussed/Reviewed Physical Activity, Assistive device use and maintanence  Physical Activity Discussed/Reviewed Physical Activity Discussed, Physical Activity Reviewed  [Using rolling walker for ambulation. Shortness of  breath increases with ambulation, so he is not exercising and is resting as neededq]  Education Interventions   Education Provided Provided Education, Provided Printed Education  [Printed education on COPD Action Zones]  Provided Verbal Education On Nutrition, When to see the doctor, Medication, Other, Labs  [Monitor O2 and record sats several times a day along with any symptoms. Take log to provider visits. Seek medical attention, and emergency medical attention if needed, for new or worsening symptoms.]  Labs Reviewed --  [Reviewed and discused recent CBC and iron panel. HGB is normal and all other anemia indicating values have improved as well.]  Nutrition Interventions   Nutrition Discussed/Reviewed Nutrition Discussed, Nutrition Reviewed, Fluid intake, Decreasing salt  [Increase water intake if possible to thin secretions, but limit fluids to 2L or less per day.]  Pharmacy Interventions   Pharmacy Dicussed/Reviewed Pharmacy Topics Discussed, Pharmacy Topics Reviewed, Medications and their functions  [Patient aware of how and when to use inhalers and neb. Recommended plain mucinex to help thin mucous.]  Safety Interventions   Safety Discussed/Reviewed Safety Discussed, Safety Reviewed, Fall Risk, Home Safety  Home Safety Assistive Devices  [continue to use walker for  ambulation. Rest as needed. Move carefully and change positions slowly to decrease risk for falls.]      Follow up plan: Follow up call scheduled for 10/24/22    Encounter Outcome:  Pt. Visit Completed   Demetrios Loll, BSN, RN-BC RN Care Coordinator [facility]  [facility] Direct Dial: [phone] Main #: [phone]

## 2022-10-24 ENCOUNTER — Encounter: Payer: Self-pay | Admitting: *Deleted

## 2022-10-24 ENCOUNTER — Ambulatory Visit: Payer: Self-pay | Admitting: *Deleted

## 2022-10-24 NOTE — Patient Outreach (Signed)
Care Coordination   Follow Up Visit Note   10/24/2022 Name: Mike Wright MRN: [id number] DOB: [birth year]  HARIHARAN KLOOS is a 89 y.o. year old male who sees [name], MD for primary care. I spoke with  [name] by phone today.  What matters to the patients health and wellness today?  Managing anemia, CHF, and COPD    Goals Addressed             This Visit's Progress    Manage Anemia   On track    Care Coordination Goals: Patient will follow-up with PCP and hematologist as planned and sooner if needed Patient will monitor for signs of worsening anemia and notify hematologist if he develops any Patient will seek emergency medical attention if needed Patient will notify provider of any obvious signs of bleeding Patient will take medications as prescribed Patient will continue to eat food rich in iron. He does tolerate these without a reaction. Patient will reach out to RN Care Coordinator [phone] with any care coordination or resource needs     Manage COPD   On track    Care Coordination Goals: Patient will keep all follow-up PCP and/or pulmonary appointments Patient will call provider with any new or worsening symptoms Patient will review and follow COPD Action Zones handout  Patient will use medications as directed by PCP or pulmonologist  Patient will increase liquids to help thin secretions but maintain a fluid restriction of 2L per day Patient will continue to use O2 at 2-3 L per minute per [place] via floor concentrator prn Patient will monitor and record O2 sats daily and as needed and will reach out to provider with any readings <93% at rest and seek medical attention for <90% at rest Patient will call RN Care Coordinator [phone] with any care coordination or resource needs      Manage Edema   On track    Care Coordination Goals: Patient will keep all medical appointments Patient will continue monitor lower extremities and abdomen for swelling/edema  and report any worsening to cardiologist Patient will take Torsemide as prescribed Patient will continue to monitor and record weights daily and will reach out to cardio with any weight gain of >2 lbs overnight of >5 lbs in one week Patient will prop legs up when at rest Patient will check and record blood pressure and reach out to cardio with any readings outside of recommended range Patient will check and record O2 sats and reach out to provider with any readings outside of recommended range Patient will reach out to RN Care Coordinator as needed [phone]          SDOH assessments and interventions completed:  Yes  SDOH Interventions Today    Flowsheet Row Most Recent Value  SDOH Interventions   Transportation Interventions Intervention Not Indicated  Financial Strain Interventions Intervention Not Indicated        Care Coordination Interventions:  Yes, provided  Interventions Today    Flowsheet Row Most Recent Value  Chronic Disease   Chronic disease during today's visit Congestive Heart Failure (CHF), Chronic Obstructive Pulmonary Disease (COPD), Other  [iron deficiency anemia]  General Interventions   General Interventions Discussed/Reviewed General Interventions Discussed, General Interventions Reviewed, Doctor Visits, Durable Medical Equipment (DME), Labs  [Patient feels much better today than he did on 10/20/22 and objectively he sounds much better. No audible shortness of breath. O2 sats >93% without supplemental O2. Wt stable at 161. No inc in edema.]  Labs --  [facility] and iron levels reviewed and discussed. Will likely repeat at hematology appt tomorrow.]  Doctor Visits Discussed/Reviewed Doctor Visits Discussed, Doctor Visits Reviewed, PCP, Specialist  [Discussed PCP visit on 10/21/22. No treatment changes.]  Durable Medical Equipment (DME) Oxygen, Walker, Other  [Pulse ox. O2 2-3 L per [place] via floor concentrator PRN]  PCP/Specialist Visits Compliance with follow-up  visit  [facility] scheduled for tomorrow and vascular studies at [facility] on 8/5. Cancelled appt with heart failure clinic on 10/28/22. He will call back to reschedule.]  Exercise Interventions   Exercise Discussed/Reviewed Physical Activity  Physical Activity Discussed/Reviewed Physical Activity Discussed, Physical Activity Reviewed  [Using rolling walker for ambulation. shortness of breath with exertion has improved.]  Education Interventions   Education Provided Provided Education  Provided Verbal Education On When to see the doctor, Labs, Other, Medication  [monitoring O2 sats. Wound care for abrasion on arm. Keep area clean and dry. Clean with gentle soap and water. Apply ointment prior to bandage. Moisten bandage just before removal to prevent it from sticking to wound and causing tearing and bleeding.]  Labs Reviewed --  [HGB is normal now. Platelets are still low. Per hematology, secondary to iron deficiency anemia and enlarged spleen.]  Nutrition Interventions   Nutrition Discussed/Reviewed Nutrition Discussed, Nutrition Reviewed, Fluid intake, Decreasing salt  [Limit fluids to <2L per day]  Pharmacy Interventions   Pharmacy Dicussed/Reviewed Pharmacy Topics Discussed, Pharmacy Topics Reviewed, Medications and their functions  [bruising and bleeding precautions with Eliquis and Plavix. May need an another iron infusion in the future depending on lab results.]  Safety Interventions   Safety Discussed/Reviewed Safety Discussed, Safety Reviewed, Fall Risk, Home Safety  Home Safety Assistive Devices       Follow up plan: Follow up call scheduled for 11/07/22    Encounter Outcome:  Pt. Visit Completed   Demetrios Loll, BSN, RN-BC RN Care Coordinator [facility]  [facility] Direct Dial: [phone] Main #: [phone]

## 2022-10-25 ENCOUNTER — Inpatient Hospital Stay (HOSPITAL_BASED_OUTPATIENT_CLINIC_OR_DEPARTMENT_OTHER): Payer: Medicare HMO | Admitting: Oncology

## 2022-10-25 VITALS — BP 109/55 | HR 51 | Temp 98.2°F | Resp 20 | Wt 154.1 lb

## 2022-10-25 DIAGNOSIS — M25521 Pain in right elbow: Secondary | ICD-10-CM | POA: Diagnosis not present

## 2022-10-25 DIAGNOSIS — D500 Iron deficiency anemia secondary to blood loss (chronic): Secondary | ICD-10-CM | POA: Diagnosis not present

## 2022-10-25 DIAGNOSIS — D696 Thrombocytopenia, unspecified: Secondary | ICD-10-CM | POA: Diagnosis not present

## 2022-10-25 DIAGNOSIS — D509 Iron deficiency anemia, unspecified: Secondary | ICD-10-CM | POA: Diagnosis not present

## 2022-10-25 NOTE — Progress Notes (Unsigned)
[facility] [address], [place] [address]   Clinic Day:  10/25/2022  Referring physician: [name], MD  Patient Care Team: [name], MD as PCP - General (Internal Medicine) Wyline Mood Dorothe Pea, MD as PCP - Cardiology (Cardiology) [name], MD as Referring Physician (Internal Medicine) Gwenith Daily, RN as [facility] Care Management Doreatha Massed, MD as Medical Oncologist (Hematology)   ASSESSMENT & PLAN:   Assessment:  1.  Thrombocytopenia: - Patient seen at the request of Sharlene Dory, NP for thrombocytopenia. - CBC (08/23/2022): PLT-87, Hb-10.2, MCV-77.  WBC-6.1. - He is on Plavix and Eliquis 2.5 mg twice daily.  New medication was torsemide. - Denies any recent antibiotics.  No bleeding.  Easy bruising positive. - He had 2-3 episodes of black stools in the last 1 month.  He also takes Pepto-Bismol.  Does not take quinine compounds.  2.  Social/family history: - Lives at home with his wife.  Uses walker/cane for ambulation.  Independent of ADLs and was driving until August 9629.  He is a retired Dentist.  Quit smoking 30 years ago. - No family history of thrombocytopenia.  Mother had uterine cancer.  Maternal aunt had stomach cancer.  Another maternal aunt had kidney cancer and third maternal aunt had throat cancer.  Another maternal aunt had leukemia.  Maternal grandfather had prostate cancer.  Niece had breast cancer.  Plan:  1.  Moderate thrombocytopenia: - Likely secondary to iron deficiency anemia as well as splenomegaly. -Labs from 08/28/2022 did not reveal any nutritional deficiencies or infection.  ANA positive.  No signs of hemolysis.  No concern for bone marrow infiltrative process.  Immunofixation electrophoresis WNL.   -Repeat labs from 10/18/2022 show improvement of his hemoglobin to 13.0 (10.7), platelet count 117,000 (110) with normal differential.  Iron saturation is 24% (14%) and ferritin is now  120 (16). -Ultrasound of his abdomen showed worsening splenomegaly from previous imaging from 2019.  -Etiology of mild/improving thrombocytopenia likely secondary to splenomegaly and relative IDA.   2.  Severe iron deficiency anemia: - He had reaction to Feraheme in 2021 and required hospitalization for close monitoring.   -Received 1000 mg INFeD on 09/08/2022.  Tolerated well.  No infusion reaction. -Cannot tolerate oral iron. - Repeat labs show significant improvement of his ferritin, iron saturation and hemoglobin.  He does not need any additional IV iron at this time. -Return to clinic in 6 weeks for labs only and in 12 weeks for labs and see a provider for possible additional IV iron.  3.  Positive ANA: -Discussed with patient and wife. -No signs of autoimmune disorder based on assessment today. -Continue to monitor.  No need for referral at this time.  4.  Right elbow pain: -Secondary to fall on Saturday. -Had imaging completed yesterday at [facility].  Results are unavailable in Care Everywhere.  Anticipate they will call him in the next day or so with results.   PLAN SUMMARY: >> Repeat labs in 6 weeks.  >> Repeat labs and see provider in 12 weeks.   I spent 20 minutes dedicated to the care of this patient (face-to-face and non-face-to-face) on the date of the encounter to include what is described in the assessment and plan.   No orders of the defined types were placed in this encounter.   [name], NP   7/30/20241:43 PM  CHIEF COMPLAINT/PURPOSE OF CONSULT:   Diagnosis: Pancytopenia   Current Therapy: INFeD x 1  HISTORY OF PRESENT ILLNESS:   Mike Wright is a 89 y.o. male presenting to clinic today for evaluation of pancytopenia at the request of Sharlene Dory, NP. He was previously seen by Dr. Candise Che at [facility] in 12/2017 for pancytopenia. He was also referred back in 12/2019 and scheduled to see Dr. Al Pimple, but he did not show for his appointment.  He is here to receive  results from his lab work from 08/29/2022.  He received 1 dose of 1000 mg of INFeD on 09/08/2022.  Tolerated infusion well.  Previously had a reaction with IV Feraheme back in 2019.  Has tried oral iron in the past but is unable to tolerate.  Wife reports he was doing well post his infusion and unfortunately suffered a fall on Saturday.  States he was trying to get up and slipped from his lift chair landed on his right elbow.  He had imaging of his right elbow completed yesterday.  Still waiting to see results.  Appetite is 100% and energy level is better at 25%.   PAST MEDICAL HISTORY:   Past Medical History: Past Medical History:  Diagnosis Date   Anemia    a. mild, noted 04/2017.   CAD in native artery    a. [place] 04/2017 s/p DES to D1, DES to prox-mid LAD, DES to prox LAD overlapping the prior stent, LVEF 55-65%.    Chronic diastolic CHF (congestive heart failure) (HCC)    Constipation    COPD (chronic obstructive pulmonary disease) (HCC)    Diabetic ulcer of toe (HCC)    DJD (degenerative joint disease) of cervical spine    Dysrhythmia    AFib   Essential hypertension    GERD (gastroesophageal reflux disease)    History of hiatal hernia    HIT (heparin-induced thrombocytopenia) (HCC)    Hypothyroidism    Hypoxia    a. went home on home O2 04/2017.   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   Retinitis    Sinus bradycardia    a. HR 30s-40s in 04/2017 -> diltiazem stopped, metoprolol reduced.   Sleep apnea    "chose not to order CPAP at this time" (05/18/2017)   Type 2 diabetes mellitus (HCC)    Wears glasses    Wheezing    a. suspected COPD 04/2017. Former tobacco x 40 years.    Surgical History: Past Surgical History:  Procedure Laterality Date   ABDOMINAL AORTOGRAM W/LOWER EXTREMITY  N/A 09/15/2017   Procedure: ABDOMINAL AORTOGRAM W/LOWER EXTREMITY;  Surgeon: Nada Libman, MD;  [facility]: [facility];  Service: Cardiovascular;  Laterality: N/A;   ABDOMINAL AORTOGRAM W/LOWER EXTREMITY N/A 02/04/2020   Procedure: ABDOMINAL AORTOGRAM W/LOWER EXTREMITY;  Surgeon: Nada Libman, MD;  Location: [facility];  Service: Cardiovascular;  Laterality: N/A;   ABDOMINAL AORTOGRAM W/LOWER EXTREMITY N/A 12/15/2020   Procedure: ABDOMINAL AORTOGRAM W/LOWER EXTREMITY;  Surgeon: Nada Libman, MD;  Location: [facility];  Service: Cardiovascular;  Laterality: N/A;   AMPUTATION Bilateral 09/20/2017   Procedure: BILATERAL GREAT TOE AMPUTATIONS INCLUDING METATARSAL HEADS;  Surgeon: Nada Libman, MD;  Location: [facility] OR;  Service: Vascular;  Laterality: Bilateral;   AMPUTATION Right 03/01/2019   Procedure: AMPUTATION TOES;  Surgeon: Chuck Hint, MD;  Location: [facility] OR;  Service: Vascular;  Laterality: Right;   AMPUTATION Right 05/29/2019   Procedure: AMPUTATION RIGHT THIRD TOE;  Surgeon: Nada Libman, MD;  Location: [facility] OR;  Service: Vascular;  Laterality: Right;   AMPUTATION Right 09/26/2019   Procedure: AMPUTATION RIGHT FOURTH TOE AND FIFTH TOES;  Surgeon: Nada Libman, MD;  Location: [facility] OR;  Service: Vascular;  Laterality: Right;   ANGIOPLASTY Right 09/20/2017   Procedure: ANGIOPLASTY RIGHT TIBIAL ARTERY;  Surgeon: Nada Libman, MD;  Location[facility] OR;  Service: Vascular;  Laterality: Right;   APPENDECTOMY     APPLICATION OF WOUND VAC  12/17/2020   Procedure: APPLICATION OF WOUND VAC;  Surgeon: Nada Libman, MD;  [facility]: [facility] OR;  Service: Vascular;;   BACK SURGERY     CARDIAC CATHETERIZATION  1980s; 2012;   CATARACT EXTRACTION Left 2004   COLONOSCOPY  2004   FLEISHMAN TICS   COLONOSCOPY WITH PROPOFOL N/A 11/28/2018   Procedure: COLONOSCOPY WITH PROPOFOL;  Surgeon: Malissa Hippo, MD;  [facility]: [facility];  Service: Endoscopy;  Laterality: N/A;    CORONARY ANGIOPLASTY WITH STENT PLACEMENT  05/18/2017   "3 stents"   CORONARY STENT INTERVENTION N/A 05/18/2017   Procedure: CORONARY STENT INTERVENTION;  Surgeon: Corky Crafts, MD;  Location: [facility] INVASIVE CV LAB;  Service: Cardiovascular;  Laterality: N/A;   ESOPHAGOGASTRODUODENOSCOPY (EGD) WITH PROPOFOL N/A 11/20/2017   Procedure: ESOPHAGOGASTRODUODENOSCOPY (EGD) WITH PROPOFOL;  Surgeon: Hilarie Fredrickson, MD;  Location: [facility] ENDOSCOPY;  Service: Gastroenterology;  Laterality: N/A;   ESOPHAGOGASTRODUODENOSCOPY (EGD) WITH PROPOFOL N/A 11/28/2018   Procedure: ESOPHAGOGASTRODUODENOSCOPY (EGD) WITH PROPOFOL;  Surgeon: Malissa Hippo, MD;  [facility];  Service: Endoscopy;  Laterality: N/A;   ESOPHAGOGASTRODUODENOSCOPY (EGD) WITH PROPOFOL N/A 07/12/2019   Procedure: ESOPHAGOGASTRODUODENOSCOPY (EGD) WITH PROPOFOL;  Surgeon: Malissa Hippo, MD;  [facility];  Service: Endoscopy;  Laterality: N/A;  125   GIVENS CAPSULE STUDY N/A 12/17/2018   Procedure: GIVENS CAPSULE STUDY;  Surgeon: Malissa Hippo, MD;  [facility];  Service: Endoscopy;  Laterality: N/A;  7:30am   I & D EXTREMITY Right 12/17/2017   Procedure: IRRIGATION AND DEBRIDEMENT RIGHT GREAT TOE;  Surgeon: Nada Libman, MD;  [facility]: [facility];  Service: Vascular;  Laterality: Right;   INCISION AND DRAINAGE Right 12/17/2020   Procedure: INCISION AND DRAINAGE OF RIGHT FOOT;  Surgeon: Nada Libman, MD;  [facility]: [facility];  Service: Vascular;  Laterality: Right;   JOINT REPLACEMENT     LEFT HEART CATH AND CORONARY ANGIOGRAPHY N/A 05/18/2017   Procedure: LEFT HEART CATH AND CORONARY ANGIOGRAPHY;  Surgeon: Corky Crafts, MD;  Location: [facility] INVASIVE CV LAB;  Service: Cardiovascular;  Laterality: N/A;   LOWER EXTREMITY ANGIOGRAM Right 09/20/2017   Procedure: RIGHT LOWER LEG  ANGIOGRAM;  Surgeon: Nada Libman, MD;  [facility]: [facility];  Service: Vascular;  Laterality: Right;   LUMBAR FUSION  2002   L3, 4  L4, 5 L5 S1 Fused by Dr. Trey Sailors   PERIPHERAL VASCULAR BALLOON ANGIOPLASTY Left 09/15/2017   Procedure: PERIPHERAL VASCULAR BALLOON ANGIOPLASTY;  Surgeon: Nada Libman, MD;  [facility];  Service: Cardiovascular;  Laterality: Left;  PTA of Peroneal & Posterior Tibial   PERIPHERAL VASCULAR INTERVENTION Right 12/15/2020   Procedure: PERIPHERAL VASCULAR INTERVENTION;  Surgeon: Nada Libman, MD;  [facility];  Service: Cardiovascular;  Laterality: Right;  SFA   POLYPECTOMY  11/28/2018  Procedure: POLYPECTOMY;  Surgeon: Malissa Hippo, MD;  [facility];  Service: Endoscopy;;  duodenum   POSTERIOR LUMBAR FUSION     RHINOPLASTY     RIGHT HEART CATH N/A 05/18/2017   Procedure: RIGHT HEART CATH;  Surgeon: Corky Crafts, MD;  Location: [facility] INVASIVE CV LAB;  Service: Cardiovascular;  Laterality: N/A;   TEE WITHOUT CARDIOVERSION N/A 09/18/2017   Procedure: TRANSESOPHAGEAL ECHOCARDIOGRAM (TEE);  Surgeon: Pricilla Riffle, MD;  Location: [facility] ENDOSCOPY;  Service: Cardiovascular;  Laterality: N/A;   TOTAL KNEE ARTHROPLASTY Bilateral    TRANSMETATARSAL AMPUTATION Right 02/04/2020   Procedure: RIGHT TRANSMETATARSAL AMPUTATION;  Surgeon: Nada Libman, MD;  Location: [facility] OR;  Service: Vascular;  Laterality: Right;   TRANSMETATARSAL AMPUTATION Left 11/12/2021   Procedure: LEFT TRANSMETATARSAL AMPUTATION;  Surgeon: Maeola Harman, MD;  Location: [facility] OR;  Service: Vascular;  Laterality: Left;   TRANSURETHRAL RESECTION OF PROSTATE  2001   Rito Ehrlich    Social History: Social History   Socioeconomic History   Marital status: Married    Spouse name: patsy   Number of children: Not on file   Years of education: Not on file   Highest education level: Not on file  Occupational History   Not on file  Tobacco Use   Smoking status: Former    Current packs/day: 0.00    Average packs/day: 2.0 packs/day for 30.0 years (60.0 ttl pk-yrs)    Types:  Cigarettes    Start date: 03/28/1953    Quit date: 03/29/1983    Years since quitting: 39.6   Smokeless tobacco: Never  Vaping Use   Vaping status: Never Used  Substance and Sexual Activity   Alcohol use: Not Currently    Comment: 05/18/2017 'I drink a beer q yr"   Drug use: No   Sexual activity: Not Currently  Other Topics Concern   Not on file  Social History Narrative   Patient lives at home with his spouse.    Social Determinants of Health   Financial Resource Strain: Low Risk  (10/24/2022)   Overall Financial Resource Strain (CARDIA)    Difficulty of Paying Living Expenses: Not hard at all  Food Insecurity: No Food Insecurity (08/29/2022)   Hunger Vital Sign    Worried About Running Out of Food in the Last Year: Never true    Ran Out of Food in the Last Year: Never true  Transportation Needs: No Transportation Needs (10/24/2022)   PRAPARE - Administrator, Civil Service (Medical): No    Lack of Transportation (Non-Medical): No  Physical Activity: Inactive (08/03/2022)   Exercise Vital Sign    Days of Exercise per Week: 0 days    Minutes of Exercise per Session: 0 min  Stress: No Stress Concern Present (07/29/2019)   [employer] of Occupational Health - Occupational Stress Questionnaire    Feeling of Stress : Not at all  Social Connections: Not on file  Intimate Partner Violence: Not At Risk (08/29/2022)   Humiliation, Afraid, Rape, and Kick questionnaire    Fear of Current or Ex-Partner: No    Emotionally Abused: No    Physically Abused: No    Sexually Abused: No    Family History: Family History  Problem Relation Age of Onset   Heart attack Father 40   COPD Father    COPD Mother    Heart disease Mother    Diabetes Mother    Hypertension Sister    CVA Sister  Diabetes Sister    Multiple sclerosis Sister     Current Medications:  Current Outpatient Medications:    Accu-Chek Softclix Lancets lancets, , Disp: , Rfl:    acetaminophen (TYLENOL) 500  MG tablet, Take 1 tablet (500 mg total) by mouth every 6 (six) hours as needed for mild pain or headache. (Patient taking differently: Take 1,000 mg by mouth at bedtime.), Disp: 30 tablet, Rfl: 0   Alcohol Swabs (B-D SINGLE USE SWABS REGULAR) PADS, , Disp: , Rfl:    amLODipine (NORVASC) 2.5 MG tablet, Take 2.5 mg by mouth daily., Disp: , Rfl:    apixaban (ELIQUIS) 2.5 MG TABS tablet, Take 2.5 mg by mouth 2 (two) times daily., Disp: , Rfl:    atorvastatin (LIPITOR) 80 MG tablet, Take 80 mg by mouth every evening. , Disp: , Rfl:    Blood Glucose Monitoring Suppl (ACCU-CHEK GUIDE) w/Device KIT, , Disp: , Rfl:    Cholecalciferol (VITAMIN D3) 50 MCG (2000 UT) TABS, Take 2,000 Units by mouth 2 (two) times daily., Disp: , Rfl:    clopidogrel (PLAVIX) 75 MG tablet, Take 1 tablet (75 mg total) by mouth daily with breakfast., Disp: 30 tablet, Rfl: 2   Cyanocobalamin (B-12) 2500 MCG TABS, Take 2,500 mcg by mouth daily., Disp: , Rfl:    diazepam (VALIUM) 2 MG tablet, Take 2 mg by mouth daily., Disp: , Rfl:    DROPLET INSULIN SYRINGE 31G X 5/16" 1 ML MISC, , Disp: , Rfl:    folic acid (FOLVITE) 1 MG tablet, Take 1 tablet (1 mg total) by mouth daily., Disp: 30 tablet, Rfl: 1   insulin glargine (LANTUS) 100 UNIT/ML injection, Inject 30 Units into the skin 2 (two) times daily as needed., Disp: , Rfl:    ipratropium-albuterol (DUONEB) 0.5-2.5 (3) MG/3ML SOLN, Take 3 mLs by nebulization every other day., Disp: , Rfl:    isosorbide mononitrate (IMDUR) 60 MG 24 hr tablet, Take 60 mg by mouth 2 (two) times daily., Disp: , Rfl:    nitroGLYCERIN (NITROSTAT) 0.4 MG SL tablet, Place 1 tablet (0.4 mg total) under the tongue every 5 (five) minutes as needed for chest pain., Disp: 25 tablet, Rfl: 3   OXYGEN, Inhale 2 L/min into the lungs See admin instructions. 2 L/min of oxygen at bedtime and during the day as needed for shortness of breath, Disp: , Rfl:    pantoprazole (PROTONIX) 40 MG tablet, Take 40 mg by mouth 2 (two)  times daily., Disp: , Rfl:    potassium chloride SA (KLOR-CON M) 20 MEQ tablet, Take 1 tablet (20 mEq total) by mouth 2 (two) times daily., Disp: 30 tablet, Rfl: 1   SPIRIVA HANDIHALER 18 MCG inhalation capsule, Place 1 capsule into inhaler and inhale daily after breakfast. , Disp: , Rfl:    torsemide (DEMADEX) 20 MG tablet, Take 1 tablet (20 mg total) by mouth 2 (two) times daily., Disp: 180 tablet, Rfl: 1   traZODone (DESYREL) 100 MG tablet, Take 200 mg by mouth at bedtime., Disp: , Rfl:    TRUE METRIX BLOOD GLUCOSE TEST test strip, , Disp: , Rfl:    Allergies: Allergies  Allergen Reactions   Codeine Shortness Of Breath   Doxycycline Swelling    Swelling and numbness in lips and face. Swelling improved after stopping. Reports still experiences numbness in bottom lip.    Feraheme [Ferumoxytol] Other (See Comments)    Diaphoretic, chest pain   Heparin Other (See Comments)    +HIT,  Severe bleeding (with  heparin drip & large doses), tolerates low doses   Iron Shortness Of Breath    Patient severe reaction to IV iron and does not tolerate oral formulations either   Losartan Swelling   Oxycodone Other (See Comments)    "Made me act out of my mind" Mental status changes- hallucinations   Latex Rash    REVIEW OF SYSTEMS:   Review of Systems  Constitutional:  Positive for fatigue.  Respiratory:  Positive for chest tightness and cough (Occasional).   Musculoskeletal:  Positive for arthralgias and myalgias (Right elbow).  Neurological:  Positive for numbness.  Psychiatric/Behavioral:  Positive for depression.      VITALS:   There were no vitals taken for this visit.  Wt Readings from Last 3 Encounters:  10/17/22 161 lb 6.4 oz (73.2 kg)  08/29/22 176 lb 14.4 oz (80.2 kg)  08/15/22 176 lb 3.2 oz (79.9 kg)    There is no height or weight on file to calculate BMI.   PHYSICAL EXAM:   Physical Exam Vitals reviewed.  Constitutional:      Appearance: Normal appearance.   Cardiovascular:     Rate and Rhythm: Normal rate and regular rhythm.  Pulmonary:     Effort: Pulmonary effort is normal.     Breath sounds: Normal breath sounds.  Musculoskeletal:     Right elbow: Swelling present. Decreased range of motion. Tenderness present.     Right lower leg: Edema present.  Neurological:     Mental Status: He is alert.    LABS:      Latest Ref Rng & Units 10/18/2022   12:31 PM 08/29/2022    1:45 PM 08/23/2022   10:29 AM  CBC  WBC 4.0 - 10.5 K/uL 6.9  6.6  6.1   Hemoglobin 13.0 - 17.0 g/dL 54.0  98.1  19.1   Hematocrit 39.0 - 52.0 % 41.1  34.9  34.3   Platelets 150 - 400 K/uL 117  110  87       Latest Ref Rng & Units 08/23/2022   10:29 AM 07/27/2022    3:38 AM 07/26/2022    4:18 AM  CMP  Glucose 70 - 99 mg/dL 478  295  621   BUN 8 - 27 mg/dL 28  27  34   Creatinine 0.76 - 1.27 mg/dL 3.08  6.57  8.46   Sodium 134 - 144 mmol/L 140  139  136   Potassium 3.5 - 5.2 mmol/L 4.3  3.6  3.3   Chloride 96 - 106 mmol/L 101  99  97   CO2 20 - 29 mmol/L 23  31  29    Calcium 8.6 - 10.2 mg/dL 8.9  8.4  8.2   Total Protein 6.5 - 8.1 g/dL  6.2  6.1   Total Bilirubin 0.3 - 1.2 mg/dL  1.2  1.4   Alkaline Phos 38 - 126 U/L  80  75   AST 15 - 41 U/L  21  19   ALT 0 - 44 U/L  13  11      No results found for: "CEA1", "CEA" / No results found for: "CEA1", "CEA" No results found for: "PSA1" No results found for: "NGE952" No results found for: "CAN125"  Lab Results  Component Value Date   TOTALPROTELP 6.7 08/29/2022   TOTALPROTELP 6.7 08/29/2022   ALBUMINELP 4.0 08/29/2022   A1GS 0.3 08/29/2022   A2GS 0.7 08/29/2022   BETS 0.8 08/29/2022   GAMS 0.9 08/29/2022   MSPIKE  Not Observed 08/29/2022   SPEI Comment 08/29/2022   Lab Results  Component Value Date   TIBC 221 (L) 10/18/2022   TIBC 378 08/29/2022   TIBC 350 07/19/2022   FERRITIN 120 10/18/2022   FERRITIN 16 (L) 08/29/2022   FERRITIN 9 (L) 07/19/2022   IRONPCTSAT 24 10/18/2022   IRONPCTSAT 14 (L)  08/29/2022   IRONPCTSAT 3 (L) 07/19/2022   Lab Results  Component Value Date   LDH 161 08/29/2022   LDH 177 01/04/2018     STUDIES:   No results found.

## 2022-10-28 ENCOUNTER — Encounter (HOSPITAL_COMMUNITY): Payer: Medicare HMO | Admitting: Cardiology

## 2022-10-31 ENCOUNTER — Ambulatory Visit (HOSPITAL_COMMUNITY): Payer: Medicare HMO

## 2022-10-31 ENCOUNTER — Other Ambulatory Visit: Payer: Self-pay | Admitting: Cardiology

## 2022-10-31 ENCOUNTER — Ambulatory Visit: Payer: Medicare HMO

## 2022-11-07 ENCOUNTER — Ambulatory Visit: Payer: Self-pay | Admitting: *Deleted

## 2022-11-07 ENCOUNTER — Encounter: Payer: Self-pay | Admitting: *Deleted

## 2022-11-07 NOTE — Patient Outreach (Signed)
Care Coordination   Follow Up Visit Note   11/07/2022 Name: Mike Wright MRN: [id number] DOB: July 13, 1933  Mike Wright is a 89 y.o. year old male who sees Mike Peri, MD for primary care. I spoke with  Mike Wright by phone today.  What matters to the patients health and wellness today?  Continuing to feel better and celebrate his birthday in 2 days with friends    Goals Addressed             This Visit's Progress    Manage Anemia   On track    Care Coordination Goals: Patient will follow-up with PCP and hematologist as planned and sooner if needed Patient will monitor for signs of worsening anemia and notify hematologist if he develops any Patient will seek emergency medical attention if needed Patient will notify provider of any obvious signs of bleeding Patient will take medications as prescribed Patient will reach out to RN Care Coordinator [phone] with any care coordination or resource needs     Manage COPD   On track    Care Coordination Goals: Patient will keep all follow-up PCP and/or pulmonary appointments Patient will call provider with any new or worsening symptoms Patient will reference COPD Action Plan handout as needed Patient will use medications as directed by PCP or pulmonologist  Patient will continue to use O2 at 2-3 L per minute per [place] via floor concentrator PRN Patient will monitor and record O2 sats daily and as needed and will reach out to provider with any readings less than 93% at rest and seek medical attention for less 90% at rest Patient will call RN Care Coordinator [phone] with any care coordination or resource needs      Manage Edema   On track    Care Coordination Goals: Patient will keep all medical appointments Patient will continue monitor lower extremities and abdomen for swelling/edema and report any worsening to cardiologist Patient will take Torsemide as prescribed Patient will continue to monitor and record  weights daily and will reach out to cardio with any weight gain of greater than 2 lbs overnight of greater than 5 lbs in one week Patient will check and record blood pressure and reach out to provider with any readings outside of recommended range Patient will check and record O2 sats and reach out to provider with any readings outside of recommended range Patient will reach out to RN Care Coordinator as needed [phone]          SDOH assessments and interventions completed:  Yes  SDOH Interventions Today    Flowsheet Row Most Recent Value  SDOH Interventions   Transportation Interventions Intervention Not Indicated  Financial Strain Interventions Intervention Not Indicated        Care Coordination Interventions:  Yes, provided  Interventions Today    Flowsheet Row Most Recent Value  Chronic Disease   Chronic disease during today's visit Congestive Heart Failure (CHF), Other, Chronic Obstructive Pulmonary Disease (COPD)  [iron deficiency anemia]  General Interventions   General Interventions Discussed/Reviewed General Interventions Discussed, General Interventions Reviewed, Doctor Visits, Durable Medical Equipment (DME), Labs  [Patient reports feeling well. No increase in edema or shortness of breath. He has a birthday in 2 days and is looking forward to celebrating with friends.]  Labs --  Mike Wright hematology labs as recommended]  Doctor Visits Discussed/Reviewed Doctor Visits Discussed, Doctor Visits Reviewed, Specialist, PCP, Annual Wellness Visits  [reviewed and discussed hematology Appt. on 10/18/22]  Durable Medical  Equipment (DME) Oxygen, Walker  [Pulse ox. O2 2-3 L per [place] via floor concentrator PRN, Scales. Blood pressure, weight, and O2 are stable at home. No readings available. Office visit readings were 109/55 HR 51, Wt 154, SpO2 95% on 10/25/22]  PCP/Specialist Visits Compliance with follow-up visit  [Dr Excell Seltzer (cardiology) 11/16/22, 12/08/22 hematology Labs at [facility]]   Exercise Interventions   Exercise Discussed/Reviewed Physical Activity  Physical Activity Discussed/Reviewed Physical Activity Discussed, Physical Activity Reviewed  [Encouraged to increase activity level slowly. Light exercise 3-5 days a week.]  Education Interventions   Education Provided Provided Education  Provided Verbal Education On Nutrition, When to see the doctor, Labs, Exercise, Medication  Labs Reviewed --  [10/18/22 HGB 13.0, HCT 41.1, Platelets 116, Ferritin 120, iron 54]  Nutrition Interventions   Nutrition Discussed/Reviewed Fluid intake, Nutrition Discussed, Nutrition Reviewed, Decreasing salt  [Limit fluids to <2L per day]  Pharmacy Interventions   Pharmacy Dicussed/Reviewed Pharmacy Topics Discussed, Pharmacy Topics Reviewed, Medications and their functions  [taking medications regularly without any questions or concerns]  Safety Interventions   Safety Discussed/Reviewed Safety Discussed, Safety Reviewed, Fall Risk, Home Safety  Home Safety Assistive Devices       Follow up plan: Follow up call scheduled for 12/09/22    Encounter Outcome:  Pt. Visit Completed   Demetrios Loll, BSN, RN-BC RN Care Coordinator [facility]  [facility] Direct Dial: [phone] Main #: [phone]

## 2022-11-16 ENCOUNTER — Encounter: Payer: Self-pay | Admitting: Cardiovascular Disease

## 2022-11-16 ENCOUNTER — Ambulatory Visit: Payer: Medicare HMO | Admitting: Cardiovascular Disease

## 2022-11-16 VITALS — BP 124/60 | HR 49 | Ht 67.0 in | Wt 166.6 lb

## 2022-11-16 DIAGNOSIS — I2510 Atherosclerotic heart disease of native coronary artery without angina pectoris: Secondary | ICD-10-CM

## 2022-11-16 DIAGNOSIS — I350 Nonrheumatic aortic (valve) stenosis: Secondary | ICD-10-CM | POA: Diagnosis not present

## 2022-11-16 DIAGNOSIS — Z955 Presence of coronary angioplasty implant and graft: Secondary | ICD-10-CM

## 2022-11-16 DIAGNOSIS — Z01810 Encounter for preprocedural cardiovascular examination: Secondary | ICD-10-CM | POA: Diagnosis not present

## 2022-11-16 DIAGNOSIS — I272 Pulmonary hypertension, unspecified: Secondary | ICD-10-CM

## 2022-11-16 NOTE — H&P (View-Only) (Signed)
Cardiology Office Note:    Date:  11/16/2022   ID:  Ashdon, Kriete [birth year], MRN [id number]  PCP:  [name], MD   [facility] Providers Cardiologist:  Dina Rich, MD     Referring MD: [name], MD   Chief Complaint  Patient presents with   Shortness of Breath    History of Present Illness:    Mike Wright is a 89 y.o. male presenting for evaluation of severe aortic stenosis.  The patient has a complex medical history, with cardiovascular problems that include coronary artery disease, heart failure with preserved ejection fraction, atrial fibrillation, and peripheral arterial disease.  Noncardiovascular problems include stage IIIa chronic kidney disease, chronic anemia, and history of encephalopathy.  The patient is here with his wife today. He has home O2 for as needed use. 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  He has mild orthopnea that resolves over a few minutes.  No PND.  He has right leg greater than left leg edema.  The patient is able to ambulate short distances in his house, but he does not go out much anymore.  When he does go out and has to cover any distance, he uses a wheelchair.  He uses a walker at other times.  He reports regular dental care, but he does have some issues with his teeth.  He has an upper plate and he has 6 remaining lower teeth that are decayed.  He is scheduling a follow-up appointment with his dentist in the near future.  He understands that he may need his teeth extracted as we move forward with TAVR evaluation.  Past Medical History:  Diagnosis Date   Anemia    a. mild, noted 04/2017.   CAD in native artery    a. [place] 04/2017 s/p DES to D1, DES to prox-mid LAD, DES to prox LAD overlapping the prior stent, LVEF 55-65%.    Chronic diastolic CHF (congestive heart failure) (HCC)    Constipation    COPD  (chronic obstructive pulmonary disease) (HCC)    Diabetic ulcer of toe (HCC)    DJD (degenerative joint disease) of cervical spine    Dysrhythmia    AFib   Essential hypertension    GERD (gastroesophageal reflux disease)    History of hiatal hernia    HIT (heparin-induced thrombocytopenia) (HCC)    Hypothyroidism    Hypoxia    a. went home on home O2 04/2017.   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   Retinitis    Sinus bradycardia    a. HR 30s-40s in 04/2017 -> diltiazem stopped, metoprolol reduced.   Sleep apnea    "chose not to order CPAP at this time" (05/18/2017)   Type 2 diabetes mellitus (HCC)    Wears glasses    Wheezing    a. suspected COPD 04/2017. Former tobacco x 40 years.    Past Surgical History:  Procedure Laterality Date   ABDOMINAL AORTOGRAM W/LOWER EXTREMITY N/A 09/15/2017   Procedure: ABDOMINAL AORTOGRAM W/LOWER EXTREMITY;  Surgeon: Nada Libman, MD;  [facility];  Service: Cardiovascular;  Laterality: N/A;  ABDOMINAL AORTOGRAM W/LOWER EXTREMITY N/A 02/04/2020   Procedure: ABDOMINAL AORTOGRAM W/LOWER EXTREMITY;  Surgeon: Nada Libman, MD;  [facility]: [facility];  Service: Cardiovascular;  Laterality: N/A;   ABDOMINAL AORTOGRAM W/LOWER EXTREMITY N/A 12/15/2020   Procedure: ABDOMINAL AORTOGRAM W/LOWER EXTREMITY;  Surgeon: Nada Libman, MD;  [facility];  Service: Cardiovascular;  Laterality: N/A;   AMPUTATION Bilateral 09/20/2017   Procedure: BILATERAL GREAT TOE AMPUTATIONS INCLUDING METATARSAL HEADS;  Surgeon: Nada Libman, MD;  Location: [facility] OR;  Service: Vascular;  Laterality: Bilateral;   AMPUTATION Right 03/01/2019   Procedure: AMPUTATION TOES;  Surgeon: Chuck Hint, MD;  Location: [facility] OR;  Service:  Vascular;  Laterality: Right;   AMPUTATION Right 05/29/2019   Procedure: AMPUTATION RIGHT THIRD TOE;  Surgeon: Nada Libman, MD;  [facility];  Service: Vascular;  Laterality: Right;   AMPUTATION Right 09/26/2019   Procedure: AMPUTATION RIGHT FOURTH TOE AND FIFTH TOES;  Surgeon: Nada Libman, MD;  [facility];  Service: Vascular;  Laterality: Right;   ANGIOPLASTY Right 09/20/2017   Procedure: ANGIOPLASTY RIGHT TIBIAL ARTERY;  Surgeon: Nada Libman, MD;  Location: [facility] OR;  Service: Vascular;  Laterality: Right;   APPENDECTOMY     APPLICATION OF WOUND VAC  12/17/2020   Procedure: APPLICATION OF WOUND VAC;  Surgeon: Nada Libman, MD;  [facility];  Service: Vascular;;   BACK SURGERY     CARDIAC CATHETERIZATION  1980s; 2012;   CATARACT EXTRACTION Left 2004   COLONOSCOPY  2004   FLEISHMAN TICS   COLONOSCOPY WITH PROPOFOL N/A 11/28/2018   Procedure: COLONOSCOPY WITH PROPOFOL;  Surgeon: Malissa Hippo, MD;  [facility];  Service: Endoscopy;  Laterality: N/A;   CORONARY ANGIOPLASTY WITH STENT PLACEMENT  05/18/2017   "3 stents"   CORONARY STENT INTERVENTION N/A 05/18/2017   Procedure: CORONARY STENT INTERVENTION;  Surgeon: Corky Crafts, MD;  Location: [facility] INVASIVE CV LAB;  Service: Cardiovascular;  Laterality: N/A;   ESOPHAGOGASTRODUODENOSCOPY (EGD) WITH PROPOFOL N/A 11/20/2017   Procedure: ESOPHAGOGASTRODUODENOSCOPY (EGD) WITH PROPOFOL;  Surgeon: Hilarie Fredrickson, MD;  Location: [facility] ENDOSCOPY;  Service: Gastroenterology;  Laterality: N/A;   ESOPHAGOGASTRODUODENOSCOPY (EGD) WITH PROPOFOL N/A 11/28/2018   Procedure: ESOPHAGOGASTRODUODENOSCOPY (EGD) WITH PROPOFOL;  Surgeon: Malissa Hippo, MD;  [facility];  Service: Endoscopy;  Laterality: N/A;   ESOPHAGOGASTRODUODENOSCOPY (EGD) WITH PROPOFOL N/A 07/12/2019   Procedure: ESOPHAGOGASTRODUODENOSCOPY (EGD) WITH PROPOFOL;  Surgeon: Malissa Hippo, MD;  [facility];  Service: Endoscopy;   Laterality: N/A;  125   GIVENS CAPSULE STUDY N/A 12/17/2018   Procedure: GIVENS CAPSULE STUDY;  Surgeon: Malissa Hippo, MD;  [facility];  Service: Endoscopy;  Laterality: N/A;  7:30am   I & D EXTREMITY Right 12/17/2017   Procedure: IRRIGATION AND DEBRIDEMENT RIGHT GREAT TOE;  Surgeon: Nada Libman, MD;  [facility];  Service: Vascular;  Laterality: Right;   INCISION AND DRAINAGE Right 12/17/2020   Procedure: INCISION AND DRAINAGE OF RIGHT FOOT;  Surgeon: Nada Libman, MD;  [facility];  Service: Vascular;  Laterality: Right;   JOINT REPLACEMENT     LEFT HEART CATH AND CORONARY ANGIOGRAPHY N/A 05/18/2017   Procedure: LEFT HEART CATH AND CORONARY ANGIOGRAPHY;  Surgeon: Corky Crafts, MD;  Location: [facility] INVASIVE CV LAB;  Service: Cardiovascular;  Laterality: N/A;   LOWER EXTREMITY ANGIOGRAM Right 09/20/2017   Procedure: RIGHT LOWER LEG  ANGIOGRAM;  Surgeon: Myra Gianotti,  Fran Lowes, MD;  [facility];  Service: Vascular;  Laterality: Right;   LUMBAR FUSION  2002   L3, 4 L4, 5 L5 S1 Fused by Dr. Trey Sailors   PERIPHERAL VASCULAR BALLOON ANGIOPLASTY Left 09/15/2017   Procedure: PERIPHERAL VASCULAR BALLOON ANGIOPLASTY;  Surgeon: Nada Libman, MD;  [facility];  Service: Cardiovascular;  Laterality: Left;  PTA of Peroneal & Posterior Tibial   PERIPHERAL VASCULAR INTERVENTION Right 12/15/2020   Procedure: PERIPHERAL VASCULAR INTERVENTION;  Surgeon: Nada Libman, MD;  [facility];  Service: Cardiovascular;  Laterality: Right;  SFA   POLYPECTOMY  11/28/2018   Procedure: POLYPECTOMY;  Surgeon: Malissa Hippo, MD;  [facility];  Service: Endoscopy;;  duodenum   POSTERIOR LUMBAR FUSION     RHINOPLASTY     RIGHT HEART CATH N/A 05/18/2017   Procedure: RIGHT HEART CATH;  Surgeon: Corky Crafts, MD;  Location: [facility] INVASIVE CV LAB;  Service: Cardiovascular;  Laterality: N/A;   TEE WITHOUT CARDIOVERSION N/A 09/18/2017   Procedure:  TRANSESOPHAGEAL ECHOCARDIOGRAM (TEE);  Surgeon: Pricilla Riffle, MD;  Location: [facility] ENDOSCOPY;  Service: Cardiovascular;  Laterality: N/A;   TOTAL KNEE ARTHROPLASTY Bilateral    TRANSMETATARSAL AMPUTATION Right 02/04/2020   Procedure: RIGHT TRANSMETATARSAL AMPUTATION;  Surgeon: Nada Libman, MD;  Location: [facility] OR;  Service: Vascular;  Laterality: Right;   TRANSMETATARSAL AMPUTATION Left 11/12/2021   Procedure: LEFT TRANSMETATARSAL AMPUTATION;  Surgeon: Maeola Harman, MD;  Location: [facility] OR;  Service: Vascular;  Laterality: Left;   TRANSURETHRAL RESECTION OF PROSTATE  2001   Rito Ehrlich    Current Medications: Current Meds  Medication Sig   Accu-Chek Softclix Lancets lancets    acetaminophen (TYLENOL) 500 MG tablet Take 1 tablet (500 mg total) by mouth every 6 (six) hours as needed for mild pain or headache. (Patient taking differently: Take 1,000 mg by mouth at bedtime.)   Alcohol Swabs (B-D SINGLE USE SWABS REGULAR) PADS    amLODipine (NORVASC) 2.5 MG tablet TAKE 1 TABLET EVERY DAY   apixaban (ELIQUIS) 2.5 MG TABS tablet Take 2.5 mg by mouth 2 (two) times daily.   atorvastatin (LIPITOR) 80 MG tablet Take 80 mg by mouth every evening.    Blood Glucose Monitoring Suppl (ACCU-CHEK GUIDE) w/Device KIT    Cholecalciferol (VITAMIN D3) 50 MCG (2000 UT) TABS Take 2,000 Units by mouth 2 (two) times daily.   clopidogrel (PLAVIX) 75 MG tablet Take 1 tablet (75 mg total) by mouth daily with breakfast.   Cyanocobalamin (B-12) 2500 MCG TABS Take 2,500 mcg by mouth daily.   diazepam (VALIUM) 2 MG tablet Take 2 mg by mouth as needed.   DROPLET INSULIN SYRINGE 31G X 5/16" 1 ML MISC    folic acid (FOLVITE) 1 MG tablet Take 1 tablet (1 mg total) by mouth daily.   insulin glargine (LANTUS) 100 UNIT/ML injection Inject 30 Units into the skin 2 (two) times daily as needed.   ipratropium-albuterol (DUONEB) 0.5-2.5 (3) MG/3ML SOLN Take 3 mLs by nebulization every other day.   isosorbide mononitrate (IMDUR)  60 MG 24 hr tablet Take 60 mg by mouth 2 (two) times daily.   nitroGLYCERIN (NITROSTAT) 0.4 MG SL tablet Place 1 tablet (0.4 mg total) under the tongue every 5 (five) minutes as needed for chest pain.   OXYGEN Inhale 2 L/min into the lungs See admin instructions. 2 L/min of oxygen at bedtime and during the day as needed for shortness of breath  pantoprazole (PROTONIX) 40 MG tablet Take 40 mg by mouth 2 (two) times daily.   potassium chloride SA (KLOR-CON M) 20 MEQ tablet Take 1 tablet (20 mEq total) by mouth 2 (two) times daily.   SPIRIVA HANDIHALER 18 MCG inhalation capsule Place 1 capsule into inhaler and inhale daily after breakfast.    torsemide (DEMADEX) 20 MG tablet Take 1 tablet (20 mg total) by mouth 2 (two) times daily.   traZODone (DESYREL) 100 MG tablet Take 200 mg by mouth at bedtime.   TRUE METRIX BLOOD GLUCOSE TEST test strip    [DISCONTINUED] Fluticasone-Umeclidin-Vilant (TRELEGY ELLIPTA IN) Inhale into the lungs.     Allergies:   Codeine, Doxycycline, Feraheme [ferumoxytol], Heparin, Iron, Losartan, Oxycodone, Trelegy ellipta [fluticasone-umeclidin-vilant], and Latex   Social History   Socioeconomic History   Marital status: Married    Spouse name: patsy   Number of children: Not on file   Years of education: Not on file   Highest education level: Not on file  Occupational History   Not on file  Tobacco Use   Smoking status: Former    Current packs/day: 0.00    Average packs/day: 2.0 packs/day for 30.0 years (60.0 ttl pk-yrs)    Types: Cigarettes    Start date: 03/28/1953    Quit date: 03/29/1983    Years since quitting: 39.6   Smokeless tobacco: Never  Vaping Use   Vaping status: Never Used  Substance and Sexual Activity   Alcohol use: Not Currently    Comment: 05/18/2017 'I drink a beer q yr"   Drug use: No   Sexual activity: Not Currently  Other Topics Concern   Not on file  Social History Narrative   Patient lives at home with his spouse.    Social  Determinants of Health   Financial Resource Strain: Low Risk  (11/07/2022)   Overall Financial Resource Strain (CARDIA)    Difficulty of Paying Living Expenses: Not hard at all  Food Insecurity: No Food Insecurity (08/29/2022)   Hunger Vital Sign    Worried About Running Out of Food in the Last Year: Never true    Ran Out of Food in the Last Year: Never true  Transportation Needs: No Transportation Needs (11/07/2022)   PRAPARE - Administrator, Civil Service (Medical): No    Lack of Transportation (Non-Medical): No  Physical Activity: Inactive (08/03/2022)   Exercise Vital Sign    Days of Exercise per Week: 0 days    Minutes of Exercise per Session: 0 min  Stress: No Stress Concern Present (07/29/2019)   [employer] of Occupational Health - Occupational Stress Questionnaire    Feeling of Stress : Not at all  Social Connections: Not on file     Family History: The patient's family history includes COPD in his father and mother; CVA in his sister; Diabetes in his mother and sister; Heart attack (age of onset: 55) in his father; Heart disease in his mother; Hypertension in his sister; Multiple sclerosis in his sister.  ROS:   Please see the history of present illness.    All other systems reviewed and are negative.  EKGs/Labs/Other Studies Reviewed:    The following studies were reviewed today: 2D echo is personally reviewed with a peak transaortic velocity of 4.02 m/s, peak and mean transvalvular gradients of 65 and 34 mmHg, and a calculated aortic valve area of 1.05 cm.  The aortic valve is severely calcified and restricted on 2D echo imaging.  Recent Labs: 07/27/2022: ALT 13; B Natriuretic Peptide 226.0 08/23/2022: BUN 28; Creatinine, Ser 1.39; Magnesium 2.1; Potassium 4.3; Sodium 140; TSH 5.220 10/18/2022: Hemoglobin 13.0; Platelets 117  Recent Lipid Panel    Component Value Date/Time   CHOL 68 03/01/2019 0320   TRIG 114 03/01/2019 0320   HDL 16 (L) 03/01/2019  0320   CHOLHDL 4.3 03/01/2019 0320   VLDL 23 03/01/2019 0320   LDLCALC 29 03/01/2019 0320     Risk Assessment/Calculations:    CHA2DS2-VASc Score = 6   This indicates a 9.7% annual risk of stroke. The patient's score is based upon: CHF History: 1 HTN History: 1 Diabetes History: 1 Stroke History: 0 Vascular Disease History: 1 Age Score: 2 Gender Score: 0               Physical Exam:    VS:  BP 124/60   Pulse (!) 49   Ht 5\' 7"  (1.702 m)   Wt 166 lb 9.6 oz (75.6 kg)   SpO2 95%   BMI 26.09 kg/m     Wt Readings from Last 3 Encounters:  11/16/22 166 lb 9.6 oz (75.6 kg)  10/25/22 154 lb 1.6 oz (69.9 kg)  10/17/22 161 lb 6.4 oz (73.2 kg)     GEN:  Well nourished, well developed pleasant elderly male in no acute distress HEENT: Normal NECK: No JVD; No carotid bruits LYMPHATICS: No lymphadenopathy CARDIAC: Irregularly irregular with grade 3/6 harsh late peaking systolic murmur at the right upper sternal border RESPIRATORY:  Clear to auscultation without rales, wheezing or rhonchi  ABDOMEN: Soft, non-tender, non-distended MUSCULOSKELETAL: 1+ right pretibial edema and trace left pretibial edema SKIN: Warm and dry NEUROLOGIC:  Alert and oriented x 3 PSYCHIATRIC:  Normal affect   ASSESSMENT:    1. Nonrheumatic aortic valve stenosis   2. Pre-procedural cardiovascular examination    PLAN:    In order of problems listed above:  The patient has moderately severe, degenerative calcific aortic stenosis.  His recent echocardiogram is reviewed and demonstrates hyperdynamic LV function with an LVEF of 70 to 75%, normal RV size and function with evidence of severe pulmonary hypertension with an RV systolic pressure estimated at 73 mmHg, severe biatrial enlargement, severe mitral annular calcification with no evidence of mitral regurgitation or stenosis, and severe aortic stenosis with a peak transaortic velocity of 4.03 m/s, mean gradient 33, peak gradient 65, dimensionless  index of 0.36, and calculated aortic valve area of 1.05 cm.  The patient has New York Heart Association functional class III symptoms of chronic diastolic heart failure.  He has significant comorbidities including stage III chronic kidney disease, permanent atrial fibrillation with slow ventricular response on chronic oral anticoagulation, bilateral toe amputations with history of PAD, and poor functional capacity with generalized weakness and poor balance.  We reviewed the natural history of aortic stenosis today.  We discussed palliative medical therapy and the expected progression with associated symptoms.  His aortic stenosis has progressed over the past year by echo assessment.  We discussed potential treatment options which would include TAVR or surgical AVR.  The patient clearly would not be a candidate for cardiac surgery due to his age and comorbidities outlined above.  TAVR is a reasonable consideration.  I discussed the role of TAVR in reducing his risk of progressive complications of severe aortic stenosis including heart failure and cardiac mortality.  He understands that it would not be expected to improve his functional capacity to a great degree because of his noncardiac  limitations.  We discussed his need to have dental consultation before proceeding as he may need extraction of his remaining lower teeth.  We discussed need to proceed with right and left heart catheterization in the context of his known coronary artery disease and multiple PCI procedures.  He also has pulmonary hypertension and should have right heart catheterization to assess his right heart pressures and hemodynamics.  In addition, he would require CT angiography studies of the chest, abdomen, and pelvis to assess for transfemoral access.  A CTA of the heart with TAVR protocol will need to be performed as well.  Once his studies are completed, he will undergo formal cardiac surgical consultation as part of a multidisciplinary  approach to his care. For cardiac cath, he will need to hold apixaban for 48 hours. I have reviewed the risks, indications, and alternatives to cardiac catheterization, possible angioplasty, and stenting with the patient. Risks include but are not limited to bleeding, infection, vascular injury, stroke, myocardial infection, arrhythmia, kidney injury, radiation-related injury in the case of prolonged fluoroscopy use, emergency cardiac surgery, and death. The patient understands the risks of serious complication is 1-2 in 1000 with diagnostic cardiac cath and 1-2% or less with angioplasty/stenting.        Informed Consent   Shared Decision Making/Informed Consent The risks [stroke (1 in 1000), death (1 in 1000), kidney failure [usually temporary] (1 in 500), bleeding (1 in 200), allergic reaction [possibly serious] (1 in 200)], benefits (diagnostic support and management of coronary artery disease) and alternatives of a cardiac catheterization were discussed in detail with Mr. Dorais and he is willing to proceed.       Medication Adjustments/Labs and Tests Ordered: Current medicines are reviewed at length with the patient today.  Concerns regarding medicines are outlined above.  Orders Placed This Encounter  Procedures   Basic metabolic panel   CBC   EKG 12-Lead   No orders of the defined types were placed in this encounter.   Patient Instructions  Medication Instructions:  Your physician recommends that you continue on your current medications as directed. Please refer to the Current Medication list given to you today.  *If you need a refill on your cardiac medications before your next appointment, please call your pharmacy*  Lab Work: CBC, BMET within 7 days of procedure If you have labs (blood work) drawn today and your tests are completely normal, you will receive your results only by: MyChart Message (if you have MyChart) OR A paper copy in the mail If you have any lab test that  is abnormal or we need to change your treatment, we will call you to review the results.  Testing/Procedures: R & L heart catheterization Your physician has requested that you have a cardiac catheterization. Cardiac catheterization is used to diagnose and/or treat various heart conditions. Doctors may recommend this procedure for a number of different reasons. The most common reason is to evaluate chest pain. Chest pain can be a symptom of coronary artery disease (CAD), and cardiac catheterization can show whether plaque is narrowing or blocking your heart's arteries. This procedure is also used to evaluate the valves, as well as measure the blood flow and oxygen levels in different parts of your heart. For further information please visit [email or web address] Please follow instruction sheet, as given.  Follow-Up: At [facility], you and your health needs are our priority.  As part of our continuing mission to provide you with exceptional heart care, we have created designated  Provider Care Teams.  These Care Teams include your primary Cardiologist (physician) and Advanced Practice Providers (APPs -  Physician Assistants and Nurse Practitioners) who all work together to provide you with the care you need, when you need it.  We recommend signing up for the patient portal called "MyChart".  Sign up information is provided on this After Visit Summary.  MyChart is used to connect with patients for Virtual Visits (Telemedicine).  Patients are able to view lab/test results, encounter notes, upcoming appointments, etc.  Non-urgent messages can be sent to your provider as well.   To learn more about what you can do with MyChart, go to [email or web address].    Your next appointment:   Structural Team to follow-up  Provider:   Tonny Bollman, MD     Other Instructions       Cardiac/Peripheral Catheterization   You are scheduled for a Cardiac Catheterization on Friday, September 20 with  Dr. Tonny Bollman.  1. Please arrive at the [facility] (Main Entrance A) at [facility]: [address] [place], [place] [address] at 8:30 AM (This time is two hour(s) before your procedure to ensure your preparation). Free valet parking service is available. You will check in at ADMITTING. The support person will be asked to wait in the waiting room.  It is OK to have someone drop you off and come back when you are ready to be discharged.        Special note: Every effort is made to have your procedure done on time. Please understand that emergencies sometimes delay scheduled procedures.  2. Diet: Do not eat solid foods after midnight.  You may have clear liquids until 5 AM the day of the procedure.  3. Labs: Monday, 12/12/22  4. Medication instructions in preparation for your procedure:   Contrast Allergy: No  DO NOT TAKE ELIQUIS for 2 days prior to procedure (last dose Tuesday, resume Saturday) DO NOT TAKE TORSEMIDE or POTASSIUM the day before or day of procedure (last dose Wednesday, resume Saturday) DECREASE LANTUS dose by half the night before (15 units) and none until after procedure On the morning of your procedure, take Aspirin 81 mg and Plavix/Clopidogrel and any morning medicines NOT listed above.  You may use sips of water.  5. Plan to go home the same day, you will only stay overnight if medically necessary. 6. You MUST have a responsible adult to drive you home. 7. An adult MUST be with you the first 24 hours after you arrive home. 8. Bring a current list of your medications, and the last time and date medication taken. 9. Bring ID and current insurance cards. 10.Please wear clothes that are easy to get on and off and wear slip-on shoes.  Thank you for allowing [place] to care for you!   -- [facility] Invasive Cardiovascular services    Signed, Tonny Bollman, MD  11/16/2022 5:05 PM    [facility]

## 2022-11-16 NOTE — Patient Instructions (Addendum)
Medication Instructions:  Your physician recommends that you continue on your current medications as directed. Please refer to the Current Medication list given to you today.  *If you need a refill on your cardiac medications before your next appointment, please call your pharmacy*  Lab Work: CBC, BMET within 7 days of procedure If you have labs (blood work) drawn today and your tests are completely normal, you will receive your results only by: MyChart Message (if you have MyChart) OR A paper copy in the mail If you have any lab test that is abnormal or we need to change your treatment, we will call you to review the results.  Testing/Procedures: R & L heart catheterization Your physician has requested that you have a cardiac catheterization. Cardiac catheterization is used to diagnose and/or treat various heart conditions. Doctors may recommend this procedure for a number of different reasons. The most common reason is to evaluate chest pain. Chest pain can be a symptom of coronary artery disease (CAD), and cardiac catheterization can show whether plaque is narrowing or blocking your heart's arteries. This procedure is also used to evaluate the valves, as well as measure the blood flow and oxygen levels in different parts of your heart. For further information please visit [email or web address] Please follow instruction sheet, as given.  Follow-Up: At [facility], you and your health needs are our priority.  As part of our continuing mission to provide you with exceptional heart care, we have created designated Provider Care Teams.  These Care Teams include your primary Cardiologist (physician) and Advanced Practice Providers (APPs -  Physician Assistants and Nurse Practitioners) who all work together to provide you with the care you need, when you need it.  We recommend signing up for the patient portal called "MyChart".  Sign up information is provided on this After Visit Summary.   MyChart is used to connect with patients for Virtual Visits (Telemedicine).  Patients are able to view lab/test results, encounter notes, upcoming appointments, etc.  Non-urgent messages can be sent to your provider as well.   To learn more about what you can do with MyChart, go to [email or web address].    Your next appointment:   Structural Team to follow-up  Provider:   Tonny Bollman, MD     Other Instructions       Cardiac/Peripheral Catheterization   You are scheduled for a Cardiac Catheterization on Friday, September 20 with Dr. Tonny Bollman.  1. Please arrive at the [facility] (Main Entrance A) at [facility]: [address] [place], [place] [address] at 8:30 AM (This time is two hour(s) before your procedure to ensure your preparation). Free valet parking service is available. You will check in at ADMITTING. The support person will be asked to wait in the waiting room.  It is OK to have someone drop you off and come back when you are ready to be discharged.        Special note: Every effort is made to have your procedure done on time. Please understand that emergencies sometimes delay scheduled procedures.  2. Diet: Do not eat solid foods after midnight.  You may have clear liquids until 5 AM the day of the procedure.  3. Labs: Monday, 12/12/22  4. Medication instructions in preparation for your procedure:   Contrast Allergy: No  DO NOT TAKE ELIQUIS for 2 days prior to procedure (last dose Tuesday, resume Saturday) DO NOT TAKE TORSEMIDE or POTASSIUM the day before or day of procedure (last  dose Wednesday, resume Saturday) DECREASE LANTUS dose by half the night before (15 units) and none until after procedure On the morning of your procedure, take Aspirin 81 mg and Plavix/Clopidogrel and any morning medicines NOT listed above.  You may use sips of water.  5. Plan to go home the same day, you will only stay overnight if medically necessary. 6. You MUST have a  responsible adult to drive you home. 7. An adult MUST be with you the first 24 hours after you arrive home. 8. Bring a current list of your medications, and the last time and date medication taken. 9. Bring ID and current insurance cards. 10.Please wear clothes that are easy to get on and off and wear slip-on shoes.  Thank you for allowing [place] to care for you!   -- [facility]

## 2022-11-16 NOTE — Progress Notes (Addendum)
Cardiology Office Note:    Date:  11/16/2022   ID:  Mike, Wright 09-28-1933, MRN [id number]  PCP:  [name], MD   [facility] Providers Cardiologist:  Dina Rich, MD     Referring MD: [name], MD   Chief Complaint  Patient presents with   Shortness of Breath    History of Present Illness:    Mike Wright is a 89 y.o. male presenting for evaluation of severe aortic stenosis.  The patient has a complex medical history, with cardiovascular problems that include coronary artery disease, heart failure with preserved ejection fraction, atrial fibrillation, and peripheral arterial disease.  Noncardiovascular problems include stage IIIa chronic kidney disease, chronic anemia, and history of encephalopathy.  The patient is here with his wife today. He has home O2 for as needed use. 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  He has mild orthopnea that resolves over a few minutes.  No PND.  He has right leg greater than left leg edema.  The patient is able to ambulate short distances in his house, but he does not go out much anymore.  When he does go out and has to cover any distance, he uses a wheelchair.  He uses a walker at other times.  He reports regular dental care, but he does have some issues with his teeth.  He has an upper plate and he has 6 remaining lower teeth that are decayed.  He is scheduling a follow-up appointment with his dentist in the near future.  He understands that he may need his teeth extracted as we move forward with TAVR evaluation.  Past Medical History:  Diagnosis Date   Anemia    a. mild, noted 04/2017.   CAD in native artery    a. [place] 04/2017 s/p DES to D1, DES to prox-mid LAD, DES to prox LAD overlapping the prior stent, LVEF 55-65%.    Chronic diastolic CHF (congestive heart failure) (HCC)    Constipation    COPD  (chronic obstructive pulmonary disease) (HCC)    Diabetic ulcer of toe (HCC)    DJD (degenerative joint disease) of cervical spine    Dysrhythmia    AFib   Essential hypertension    GERD (gastroesophageal reflux disease)    History of hiatal hernia    HIT (heparin-induced thrombocytopenia) (HCC)    Hypothyroidism    Hypoxia    a. went home on home O2 04/2017.   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   Retinitis    Sinus bradycardia    a. HR 30s-40s in 04/2017 -> diltiazem stopped, metoprolol reduced.   Sleep apnea    "chose not to order CPAP at this time" (05/18/2017)   Type 2 diabetes mellitus (HCC)    Wears glasses    Wheezing    a. suspected COPD 04/2017. Former tobacco x 40 years.    Past Surgical History:  Procedure Laterality Date   ABDOMINAL AORTOGRAM W/LOWER EXTREMITY N/A 09/15/2017   Procedure: ABDOMINAL AORTOGRAM W/LOWER EXTREMITY;  Surgeon: Nada Libman, MD;  [facility];  Service: Cardiovascular;  Laterality: N/A;  ABDOMINAL AORTOGRAM W/LOWER EXTREMITY N/A 02/04/2020   Procedure: ABDOMINAL AORTOGRAM W/LOWER EXTREMITY;  Surgeon: Nada Libman, MD;  [facility]: [facility];  Service: Cardiovascular;  Laterality: N/A;   ABDOMINAL AORTOGRAM W/LOWER EXTREMITY N/A 12/15/2020   Procedure: ABDOMINAL AORTOGRAM W/LOWER EXTREMITY;  Surgeon: Nada Libman, MD;  [facility]: [facility];  Service: Cardiovascular;  Laterality: N/A;   AMPUTATION Bilateral 09/20/2017   Procedure: BILATERAL GREAT TOE AMPUTATIONS INCLUDING METATARSAL HEADS;  Surgeon: Nada Libman, MD;  Location: [facility] OR;  Service: Vascular;  Laterality: Bilateral;   AMPUTATION Right 03/01/2019   Procedure: AMPUTATION TOES;  Surgeon: Chuck Hint, MD;  Location: [facility] OR;  Service:  Vascular;  Laterality: Right;   AMPUTATION Right 05/29/2019   Procedure: AMPUTATION RIGHT THIRD TOE;  Surgeon: Nada Libman, MD;  Location: [facility] OR;  Service: Vascular;  Laterality: Right;   AMPUTATION Right 09/26/2019   Procedure: AMPUTATION RIGHT FOURTH TOE AND FIFTH TOES;  Surgeon: Nada Libman, MD;  [facility]: [facility] OR;  Service: Vascular;  Laterality: Right;   ANGIOPLASTY Right 09/20/2017   Procedure: ANGIOPLASTY RIGHT TIBIAL ARTERY;  Surgeon: Nada Libman, MD;  Location: [facility] OR;  Service: Vascular;  Laterality: Right;   APPENDECTOMY     APPLICATION OF WOUND VAC  12/17/2020   Procedure: APPLICATION OF WOUND VAC;  Surgeon: Nada Libman, MD;  [facility]: [facility] OR;  Service: Vascular;;   BACK SURGERY     CARDIAC CATHETERIZATION  1980s; 2012;   CATARACT EXTRACTION Left 2004   COLONOSCOPY  2004   FLEISHMAN TICS   COLONOSCOPY WITH PROPOFOL N/A 11/28/2018   Procedure: COLONOSCOPY WITH PROPOFOL;  Surgeon: Malissa Hippo, MD;  [facility];  Service: Endoscopy;  Laterality: N/A;   CORONARY ANGIOPLASTY WITH STENT PLACEMENT  05/18/2017   "3 stents"   CORONARY STENT INTERVENTION N/A 05/18/2017   Procedure: CORONARY STENT INTERVENTION;  Surgeon: Corky Crafts, MD;  Location: [facility] INVASIVE CV LAB;  Service: Cardiovascular;  Laterality: N/A;   ESOPHAGOGASTRODUODENOSCOPY (EGD) WITH PROPOFOL N/A 11/20/2017   Procedure: ESOPHAGOGASTRODUODENOSCOPY (EGD) WITH PROPOFOL;  Surgeon: Hilarie Fredrickson, MD;  Location: [facility] ENDOSCOPY;  Service: Gastroenterology;  Laterality: N/A;   ESOPHAGOGASTRODUODENOSCOPY (EGD) WITH PROPOFOL N/A 11/28/2018   Procedure: ESOPHAGOGASTRODUODENOSCOPY (EGD) WITH PROPOFOL;  Surgeon: Malissa Hippo, MD;  [facility];  Service: Endoscopy;  Laterality: N/A;   ESOPHAGOGASTRODUODENOSCOPY (EGD) WITH PROPOFOL N/A 07/12/2019   Procedure: ESOPHAGOGASTRODUODENOSCOPY (EGD) WITH PROPOFOL;  Surgeon: Malissa Hippo, MD;  [facility];  Service: Endoscopy;   Laterality: N/A;  125   GIVENS CAPSULE STUDY N/A 12/17/2018   Procedure: GIVENS CAPSULE STUDY;  Surgeon: Malissa Hippo, MD;  [facility];  Service: Endoscopy;  Laterality: N/A;  7:30am   I & D EXTREMITY Right 12/17/2017   Procedure: IRRIGATION AND DEBRIDEMENT RIGHT GREAT TOE;  Surgeon: Nada Libman, MD;  [facility];  Service: Vascular;  Laterality: Right;   INCISION AND DRAINAGE Right 12/17/2020   Procedure: INCISION AND DRAINAGE OF RIGHT FOOT;  Surgeon: Nada Libman, MD;  Location[facility];  Service: Vascular;  Laterality: Right;   JOINT REPLACEMENT     LEFT HEART CATH AND CORONARY ANGIOGRAPHY N/A 05/18/2017   Procedure: LEFT HEART CATH AND CORONARY ANGIOGRAPHY;  Surgeon: Corky Crafts, MD;  Location: [facility] INVASIVE CV LAB;  Service: Cardiovascular;  Laterality: N/A;   LOWER EXTREMITY ANGIOGRAM Right 09/20/2017   Procedure: RIGHT LOWER LEG  ANGIOGRAM;  Surgeon: Myra Gianotti,  Fran Lowes, MD;  Location: [facility] OR;  Service: Vascular;  Laterality: Right;   LUMBAR FUSION  2002   L3, 4 L4, 5 L5 S1 Fused by Dr. Trey Sailors   PERIPHERAL VASCULAR BALLOON ANGIOPLASTY Left 09/15/2017   Procedure: PERIPHERAL VASCULAR BALLOON ANGIOPLASTY;  Surgeon: Nada Libman, MD;  [facility];  Service: Cardiovascular;  Laterality: Left;  PTA of Peroneal & Posterior Tibial   PERIPHERAL VASCULAR INTERVENTION Right 12/15/2020   Procedure: PERIPHERAL VASCULAR INTERVENTION;  Surgeon: Nada Libman, MD;  [facility];  Service: Cardiovascular;  Laterality: Right;  SFA   POLYPECTOMY  11/28/2018   Procedure: POLYPECTOMY;  Surgeon: Malissa Hippo, MD;  [facility];  Service: Endoscopy;;  duodenum   POSTERIOR LUMBAR FUSION     RHINOPLASTY     RIGHT HEART CATH N/A 05/18/2017   Procedure: RIGHT HEART CATH;  Surgeon: Corky Crafts, MD;  Location: [facility] INVASIVE CV LAB;  Service: Cardiovascular;  Laterality: N/A;   TEE WITHOUT CARDIOVERSION N/A 09/18/2017   Procedure:  TRANSESOPHAGEAL ECHOCARDIOGRAM (TEE);  Surgeon: Pricilla Riffle, MD;  Location: [facility] ENDOSCOPY;  Service: Cardiovascular;  Laterality: N/A;   TOTAL KNEE ARTHROPLASTY Bilateral    TRANSMETATARSAL AMPUTATION Right 02/04/2020   Procedure: RIGHT TRANSMETATARSAL AMPUTATION;  Surgeon: Nada Libman, MD;  Location: [facility] OR;  Service: Vascular;  Laterality: Right;   TRANSMETATARSAL AMPUTATION Left 11/12/2021   Procedure: LEFT TRANSMETATARSAL AMPUTATION;  Surgeon: Maeola Harman, MD;  Location: [facility] OR;  Service: Vascular;  Laterality: Left;   TRANSURETHRAL RESECTION OF PROSTATE  2001   Rito Ehrlich    Current Medications: Current Meds  Medication Sig   Accu-Chek Softclix Lancets lancets    acetaminophen (TYLENOL) 500 MG tablet Take 1 tablet (500 mg total) by mouth every 6 (six) hours as needed for mild pain or headache. (Patient taking differently: Take 1,000 mg by mouth at bedtime.)   Alcohol Swabs (B-D SINGLE USE SWABS REGULAR) PADS    amLODipine (NORVASC) 2.5 MG tablet TAKE 1 TABLET EVERY DAY   apixaban (ELIQUIS) 2.5 MG TABS tablet Take 2.5 mg by mouth 2 (two) times daily.   atorvastatin (LIPITOR) 80 MG tablet Take 80 mg by mouth every evening.    Blood Glucose Monitoring Suppl (ACCU-CHEK GUIDE) w/Device KIT    Cholecalciferol (VITAMIN D3) 50 MCG (2000 UT) TABS Take 2,000 Units by mouth 2 (two) times daily.   clopidogrel (PLAVIX) 75 MG tablet Take 1 tablet (75 mg total) by mouth daily with breakfast.   Cyanocobalamin (B-12) 2500 MCG TABS Take 2,500 mcg by mouth daily.   diazepam (VALIUM) 2 MG tablet Take 2 mg by mouth as needed.   DROPLET INSULIN SYRINGE 31G X 5/16" 1 ML MISC    folic acid (FOLVITE) 1 MG tablet Take 1 tablet (1 mg total) by mouth daily.   insulin glargine (LANTUS) 100 UNIT/ML injection Inject 30 Units into the skin 2 (two) times daily as needed.   ipratropium-albuterol (DUONEB) 0.5-2.5 (3) MG/3ML SOLN Take 3 mLs by nebulization every other day.   isosorbide mononitrate (IMDUR)  60 MG 24 hr tablet Take 60 mg by mouth 2 (two) times daily.   nitroGLYCERIN (NITROSTAT) 0.4 MG SL tablet Place 1 tablet (0.4 mg total) under the tongue every 5 (five) minutes as needed for chest pain.   OXYGEN Inhale 2 L/min into the lungs See admin instructions. 2 L/min of oxygen at bedtime and during the day as needed for shortness of breath  pantoprazole (PROTONIX) 40 MG tablet Take 40 mg by mouth 2 (two) times daily.   potassium chloride SA (KLOR-CON M) 20 MEQ tablet Take 1 tablet (20 mEq total) by mouth 2 (two) times daily.   SPIRIVA HANDIHALER 18 MCG inhalation capsule Place 1 capsule into inhaler and inhale daily after breakfast.    torsemide (DEMADEX) 20 MG tablet Take 1 tablet (20 mg total) by mouth 2 (two) times daily.   traZODone (DESYREL) 100 MG tablet Take 200 mg by mouth at bedtime.   TRUE METRIX BLOOD GLUCOSE TEST test strip    [DISCONTINUED] Fluticasone-Umeclidin-Vilant (TRELEGY ELLIPTA IN) Inhale into the lungs.     Allergies:   Codeine, Doxycycline, Feraheme [ferumoxytol], Heparin, Iron, Losartan, Oxycodone, Trelegy ellipta [fluticasone-umeclidin-vilant], and Latex   Social History   Socioeconomic History   Marital status: Married    Spouse name: patsy   Number of children: Not on file   Years of education: Not on file   Highest education level: Not on file  Occupational History   Not on file  Tobacco Use   Smoking status: Former    Current packs/day: 0.00    Average packs/day: 2.0 packs/day for 30.0 years (60.0 ttl pk-yrs)    Types: Cigarettes    Start date: 03/28/1953    Quit date: 03/29/1983    Years since quitting: 39.6   Smokeless tobacco: Never  Vaping Use   Vaping status: Never Used  Substance and Sexual Activity   Alcohol use: Not Currently    Comment: 05/18/2017 'I drink a beer q yr"   Drug use: No   Sexual activity: Not Currently  Other Topics Concern   Not on file  Social History Narrative   Patient lives at home with his spouse.    Social  Determinants of Health   Financial Resource Strain: Low Risk  (11/07/2022)   Overall Financial Resource Strain (CARDIA)    Difficulty of Paying Living Expenses: Not hard at all  Food Insecurity: No Food Insecurity (08/29/2022)   Hunger Vital Sign    Worried About Running Out of Food in the Last Year: Never true    Ran Out of Food in the Last Year: Never true  Transportation Needs: No Transportation Needs (11/07/2022)   PRAPARE - Administrator, Civil Service (Medical): No    Lack of Transportation (Non-Medical): No  Physical Activity: Inactive (08/03/2022)   Exercise Vital Sign    Days of Exercise per Week: 0 days    Minutes of Exercise per Session: 0 min  Stress: No Stress Concern Present (07/29/2019)   [employer] of Occupational Health - Occupational Stress Questionnaire    Feeling of Stress : Not at all  Social Connections: Not on file     Family History: The patient's family history includes COPD in his father and mother; CVA in his sister; Diabetes in his mother and sister; Heart attack (age of onset: 61) in his father; Heart disease in his mother; Hypertension in his sister; Multiple sclerosis in his sister.  ROS:   Please see the history of present illness.    All other systems reviewed and are negative.  EKGs/Labs/Other Studies Reviewed:    The following studies were reviewed today: 2D echo is personally reviewed with a peak transaortic velocity of 4.02 m/s, peak and mean transvalvular gradients of 65 and 34 mmHg, and a calculated aortic valve area of 1.05 cm.  The aortic valve is severely calcified and restricted on 2D echo imaging.  Recent Labs: 07/27/2022: ALT 13; B Natriuretic Peptide 226.0 08/23/2022: BUN 28; Creatinine, Ser 1.39; Magnesium 2.1; Potassium 4.3; Sodium 140; TSH 5.220 10/18/2022: Hemoglobin 13.0; Platelets 117  Recent Lipid Panel    Component Value Date/Time   CHOL 68 03/01/2019 0320   TRIG 114 03/01/2019 0320   HDL 16 (L) 03/01/2019  0320   CHOLHDL 4.3 03/01/2019 0320   VLDL 23 03/01/2019 0320   LDLCALC 29 03/01/2019 0320     Risk Assessment/Calculations:    CHA2DS2-VASc Score = 6   This indicates a 9.7% annual risk of stroke. The patient's score is based upon: CHF History: 1 HTN History: 1 Diabetes History: 1 Stroke History: 0 Vascular Disease History: 1 Age Score: 2 Gender Score: 0               Physical Exam:    VS:  BP 124/60   Pulse (!) 49   Ht 5\' 7"  (1.702 m)   Wt 166 lb 9.6 oz (75.6 kg)   SpO2 95%   BMI 26.09 kg/m     Wt Readings from Last 3 Encounters:  11/16/22 166 lb 9.6 oz (75.6 kg)  10/25/22 154 lb 1.6 oz (69.9 kg)  10/17/22 161 lb 6.4 oz (73.2 kg)     GEN:  Well nourished, well developed pleasant elderly male in no acute distress HEENT: Normal NECK: No JVD; No carotid bruits LYMPHATICS: No lymphadenopathy CARDIAC: Irregularly irregular with grade 3/6 harsh late peaking systolic murmur at the right upper sternal border RESPIRATORY:  Clear to auscultation without rales, wheezing or rhonchi  ABDOMEN: Soft, non-tender, non-distended MUSCULOSKELETAL: 1+ right pretibial edema and trace left pretibial edema SKIN: Warm and dry NEUROLOGIC:  Alert and oriented x 3 PSYCHIATRIC:  Normal affect   ASSESSMENT:    1. Nonrheumatic aortic valve stenosis   2. Pre-procedural cardiovascular examination    PLAN:    In order of problems listed above:  The patient has moderately severe, degenerative calcific aortic stenosis.  His recent echocardiogram is reviewed and demonstrates hyperdynamic LV function with an LVEF of 70 to 75%, normal RV size and function with evidence of severe pulmonary hypertension with an RV systolic pressure estimated at 73 mmHg, severe biatrial enlargement, severe mitral annular calcification with no evidence of mitral regurgitation or stenosis, and severe aortic stenosis with a peak transaortic velocity of 4.03 m/s, mean gradient 33, peak gradient 65, dimensionless  index of 0.36, and calculated aortic valve area of 1.05 cm.  The patient has New York Heart Association functional class III symptoms of chronic diastolic heart failure.  He has significant comorbidities including stage III chronic kidney disease, permanent atrial fibrillation with slow ventricular response on chronic oral anticoagulation, bilateral toe amputations with history of PAD, and poor functional capacity with generalized weakness and poor balance.  We reviewed the natural history of aortic stenosis today.  We discussed palliative medical therapy and the expected progression with associated symptoms.  His aortic stenosis has progressed over the past year by echo assessment.  We discussed potential treatment options which would include TAVR or surgical AVR.  The patient clearly would not be a candidate for cardiac surgery due to his age and comorbidities outlined above.  TAVR is a reasonable consideration.  I discussed the role of TAVR in reducing his risk of progressive complications of severe aortic stenosis including heart failure and cardiac mortality.  He understands that it would not be expected to improve his functional capacity to a great degree because of his noncardiac  limitations.  We discussed his need to have dental consultation before proceeding as he may need extraction of his remaining lower teeth.  We discussed need to proceed with right and left heart catheterization in the context of his known coronary artery disease and multiple PCI procedures.  He also has pulmonary hypertension and should have right heart catheterization to assess his right heart pressures and hemodynamics.  In addition, he would require CT angiography studies of the chest, abdomen, and pelvis to assess for transfemoral access.  A CTA of the heart with TAVR protocol will need to be performed as well.  Once his studies are completed, he will undergo formal cardiac surgical consultation as part of a multidisciplinary  approach to his care. For cardiac cath, he will need to hold apixaban for 48 hours. I have reviewed the risks, indications, and alternatives to cardiac catheterization, possible angioplasty, and stenting with the patient. Risks include but are not limited to bleeding, infection, vascular injury, stroke, myocardial infection, arrhythmia, kidney injury, radiation-related injury in the case of prolonged fluoroscopy use, emergency cardiac surgery, and death. The patient understands the risks of serious complication is 1-2 in 1000 with diagnostic cardiac cath and 1-2% or less with angioplasty/stenting.        Informed Consent   Shared Decision Making/Informed Consent The risks [stroke (1 in 1000), death (1 in 1000), kidney failure [usually temporary] (1 in 500), bleeding (1 in 200), allergic reaction [possibly serious] (1 in 200)], benefits (diagnostic support and management of coronary artery disease) and alternatives of a cardiac catheterization were discussed in detail with Mr. Lachney and he is willing to proceed.       Medication Adjustments/Labs and Tests Ordered: Current medicines are reviewed at length with the patient today.  Concerns regarding medicines are outlined above.  Orders Placed This Encounter  Procedures   Basic metabolic panel   CBC   EKG 12-Lead   No orders of the defined types were placed in this encounter.   Patient Instructions  Medication Instructions:  Your physician recommends that you continue on your current medications as directed. Please refer to the Current Medication list given to you today.  *If you need a refill on your cardiac medications before your next appointment, please call your pharmacy*  Lab Work: CBC, BMET within 7 days of procedure If you have labs (blood work) drawn today and your tests are completely normal, you will receive your results only by: MyChart Message (if you have MyChart) OR A paper copy in the mail If you have any lab test that  is abnormal or we need to change your treatment, we will call you to review the results.  Testing/Procedures: R & L heart catheterization Your physician has requested that you have a cardiac catheterization. Cardiac catheterization is used to diagnose and/or treat various heart conditions. Doctors may recommend this procedure for a number of different reasons. The most common reason is to evaluate chest pain. Chest pain can be a symptom of coronary artery disease (CAD), and cardiac catheterization can show whether plaque is narrowing or blocking your heart's arteries. This procedure is also used to evaluate the valves, as well as measure the blood flow and oxygen levels in different parts of your heart. For further information please visit [email or web address] Please follow instruction sheet, as given.  Follow-Up: At [facility], you and your health needs are our priority.  As part of our continuing mission to provide you with exceptional heart care, we have created designated  Provider Care Teams.  These Care Teams include your primary Cardiologist (physician) and Advanced Practice Providers (APPs -  Physician Assistants and Nurse Practitioners) who all work together to provide you with the care you need, when you need it.  We recommend signing up for the patient portal called "MyChart".  Sign up information is provided on this After Visit Summary.  MyChart is used to connect with patients for Virtual Visits (Telemedicine).  Patients are able to view lab/test results, encounter notes, upcoming appointments, etc.  Non-urgent messages can be sent to your provider as well.   To learn more about what you can do with MyChart, go to [email or web address].    Your next appointment:   Structural Team to follow-up  Provider:   Tonny Bollman, MD     Other Instructions       Cardiac/Peripheral Catheterization   You are scheduled for a Cardiac Catheterization on Friday, September 20 with  Dr. Tonny Bollman.  1. Please arrive at the [facility] (Main Entrance A) at [facility]: [address] [place], [place] [address] at 8:30 AM (This time is two hour(s) before your procedure to ensure your preparation). Free valet parking service is available. You will check in at ADMITTING. The support person will be asked to wait in the waiting room.  It is OK to have someone drop you off and come back when you are ready to be discharged.        Special note: Every effort is made to have your procedure done on time. Please understand that emergencies sometimes delay scheduled procedures.  2. Diet: Do not eat solid foods after midnight.  You may have clear liquids until 5 AM the day of the procedure.  3. Labs: Monday, 12/12/22  4. Medication instructions in preparation for your procedure:   Contrast Allergy: No  DO NOT TAKE ELIQUIS for 2 days prior to procedure (last dose Tuesday, resume Saturday) DO NOT TAKE TORSEMIDE or POTASSIUM the day before or day of procedure (last dose Wednesday, resume Saturday) DECREASE LANTUS dose by half the night before (15 units) and none until after procedure On the morning of your procedure, take Aspirin 81 mg and Plavix/Clopidogrel and any morning medicines NOT listed above.  You may use sips of water.  5. Plan to go home the same day, you will only stay overnight if medically necessary. 6. You MUST have a responsible adult to drive you home. 7. An adult MUST be with you the first 24 hours after you arrive home. 8. Bring a current list of your medications, and the last time and date medication taken. 9. Bring ID and current insurance cards. 10.Please wear clothes that are easy to get on and off and wear slip-on shoes.  Thank you for allowing [place] to care for you!   -- [facility]    Signed, Tonny Bollman, MD  11/16/2022 5:05 PM    [facility]  Addendum: Patient's case reviewed in our  multidisciplinary heart valve team meeting after the patient underwent further diagnostic studies.  We feel the patient suffers from severe symptomatic aortic stenosis based on the following features: Heart failure admission earlier this calendar year with volume overload and decompensated congestive heart failure, progressive aortic stenosis with mean transvalvular gradient increasing from 25 mmHg in August 2000 23 to 33 mmHg on most recent echo, and maximal transaortic velocity increasing from 3.46 m/s up to 4.03 m/s on the most recent study.  We have  recommended proceeding with TAVR to better control symptoms in the context of his progressive aortic stenosis.  Tonny Bollman 01/10/2023 11:38 AM

## 2022-11-22 DIAGNOSIS — R197 Diarrhea, unspecified: Secondary | ICD-10-CM | POA: Diagnosis not present

## 2022-11-22 DIAGNOSIS — I5032 Chronic diastolic (congestive) heart failure: Secondary | ICD-10-CM | POA: Diagnosis not present

## 2022-11-22 DIAGNOSIS — J449 Chronic obstructive pulmonary disease, unspecified: Secondary | ICD-10-CM | POA: Diagnosis not present

## 2022-11-22 DIAGNOSIS — I350 Nonrheumatic aortic (valve) stenosis: Secondary | ICD-10-CM | POA: Diagnosis not present

## 2022-11-22 DIAGNOSIS — I10 Essential (primary) hypertension: Secondary | ICD-10-CM | POA: Diagnosis not present

## 2022-11-22 DIAGNOSIS — Z299 Encounter for prophylactic measures, unspecified: Secondary | ICD-10-CM | POA: Diagnosis not present

## 2022-12-05 ENCOUNTER — Other Ambulatory Visit: Payer: Self-pay | Admitting: Nurse Practitioner

## 2022-12-07 ENCOUNTER — Other Ambulatory Visit: Payer: Self-pay | Admitting: Oncology

## 2022-12-07 ENCOUNTER — Other Ambulatory Visit: Payer: Self-pay

## 2022-12-07 DIAGNOSIS — I2510 Atherosclerotic heart disease of native coronary artery without angina pectoris: Secondary | ICD-10-CM

## 2022-12-07 DIAGNOSIS — D500 Iron deficiency anemia secondary to blood loss (chronic): Secondary | ICD-10-CM

## 2022-12-08 ENCOUNTER — Inpatient Hospital Stay: Payer: Medicare HMO | Attending: Hematology

## 2022-12-08 DIAGNOSIS — D500 Iron deficiency anemia secondary to blood loss (chronic): Secondary | ICD-10-CM

## 2022-12-08 DIAGNOSIS — D696 Thrombocytopenia, unspecified: Secondary | ICD-10-CM | POA: Insufficient documentation

## 2022-12-08 DIAGNOSIS — I2510 Atherosclerotic heart disease of native coronary artery without angina pectoris: Secondary | ICD-10-CM

## 2022-12-08 DIAGNOSIS — D509 Iron deficiency anemia, unspecified: Secondary | ICD-10-CM | POA: Diagnosis not present

## 2022-12-08 LAB — CBC WITH DIFFERENTIAL/PLATELET
Abs Immature Granulocytes: 0.03 10*3/uL (ref 0.00–0.07)
Basophils Absolute: 0 10*3/uL (ref 0.0–0.1)
Basophils Relative: 1 %
Eosinophils Absolute: 0.1 10*3/uL (ref 0.0–0.5)
Eosinophils Relative: 2 %
HCT: 39.3 % (ref 39.0–52.0)
Hemoglobin: 12.6 g/dL — ABNORMAL LOW (ref 13.0–17.0)
Immature Granulocytes: 1 %
Lymphocytes Relative: 23 %
Lymphs Abs: 1 10*3/uL (ref 0.7–4.0)
MCH: 28.7 pg (ref 26.0–34.0)
MCHC: 32.1 g/dL (ref 30.0–36.0)
MCV: 89.5 fL (ref 80.0–100.0)
Monocytes Absolute: 0.9 10*3/uL (ref 0.1–1.0)
Monocytes Relative: 21 %
Neutro Abs: 2.3 10*3/uL (ref 1.7–7.7)
Neutrophils Relative %: 52 %
Platelets: 136 10*3/uL — ABNORMAL LOW (ref 150–400)
RBC: 4.39 MIL/uL (ref 4.22–5.81)
RDW: 18.3 % — ABNORMAL HIGH (ref 11.5–15.5)
WBC: 4.4 10*3/uL (ref 4.0–10.5)
nRBC: 0 % (ref 0.0–0.2)

## 2022-12-08 LAB — IRON AND TIBC
Iron: 68 ug/dL (ref 45–182)
Saturation Ratios: 26 % (ref 17.9–39.5)
TIBC: 265 ug/dL (ref 250–450)
UIBC: 197 ug/dL

## 2022-12-08 LAB — FERRITIN: Ferritin: 91 ng/mL (ref 24–336)

## 2022-12-09 ENCOUNTER — Encounter: Payer: Self-pay | Admitting: *Deleted

## 2022-12-09 ENCOUNTER — Ambulatory Visit: Payer: Self-pay | Admitting: *Deleted

## 2022-12-12 NOTE — Patient Outreach (Signed)
Care Coordination   Follow Up Visit Note   12/09/2022 Name: Mike Wright MRN: [id number] DOB: 1933-08-28  Mike Wright is a 89 y.o. year old male who sees Mike Peri, MD for primary care. I spoke with  Mike Wright by phone today.  What matters to the patients health and wellness today?  Preparing for heart catheterization     Goals Addressed             This Visit's Progress    Manage Aortic Valve Stenosis       Care Coordination Goals: Patient will keep all medical appointment RIGHT/LEFT HEART CATH AND CORONARY ANGIOGRAPHY scheduled for 12/16/22 Vein and vascular on 01/16/23 Cardiologist on 01/17/23 Patient will consult with dentist per Mike Wright recommendation regarding teeth that may need to be removed before proceeding with valve replacement Patient will reach out to RN Care Management Coordinator [phone] with any resource or care coordination needs     Manage COPD   On track    Care Coordination Goals: Patient will keep all follow-up PCP and/or pulmonary appointments Patient will call provider with any new or worsening symptoms Patient will reference COPD Action Plan handout as needed Patient will use medications as directed by PCP or pulmonologist  Patient will continue to use O2 at 2-3 L per minute per [place] via floor concentrator PRN Patient will monitor and record O2 saturation levels daily and as needed and will reach out to provider with any readings less than 93% at rest and seek medical attention for less 90% at rest Patient will call RN Care Coordinator [phone] with any care coordination or resource needs      Manage Edema   On track    Care Coordination Goals: Patient will keep all medical appointments Patient will continue monitor lower extremities and abdomen for swelling/edema and report any worsening to cardiologist Patient will take Torsemide as prescribed Patient will continue to monitor and record weights daily and will reach out to  cardio with any weight gain of greater than 2 lbs overnight of greater than 5 lbs in one week Patient will check and record blood pressure and reach out to provider with any readings outside of recommended range Patient will check and record O2 saturation levels and reach out to provider with any readings outside of recommended range Patient will reach out to RN Care Coordinator as needed [phone]          SDOH assessments and interventions completed:  Yes  SDOH Interventions Today    Flowsheet Row Most Recent Value  SDOH Interventions   Transportation Interventions Intervention Not Indicated  Financial Strain Interventions Intervention Not Indicated        Care Coordination Interventions:  Yes, provided  Interventions Today    Flowsheet Row Most Recent Value  Chronic Disease   Chronic disease during today's visit Chronic Obstructive Pulmonary Disease (COPD), Congestive Heart Failure (CHF), Other  [Aortic Valve Stenosis]  General Interventions   General Interventions Discussed/Reviewed General Interventions Discussed, General Interventions Reviewed, Doctor Visits, Durable Medical Equipment (DME)  Doctor Visits Discussed/Reviewed Doctor Visits Discussed, Doctor Visits Reviewed, PCP, Specialist  Mike Wright and discussed recent cardiology visit and plan for cardiac cath with angiography on 12/16/22. They are moving towards a valve replacement.]  Durable Medical Equipment (DME) Oxygen, Walker, Wheelchair  [pulse ox- No readings available but states it is doing well. BP monitor- no readings available. Supplemental oxygen at 2 to 3 L/min via floor concentrator.]  Wheelchair Standard  PCP/Specialist  Visits Compliance with follow-up visit  [Cardiac cath and angiography on 12/16/22, vein and vascular on 01/16/23, and cardiologist on 01/17/23. Schedule appointment with dentist Re: possible need for existing teeth removal prior to valve replacement]  Exercise Interventions   Exercise  Discussed/Reviewed Exercise Discussed, Exercise Reviewed, Physical Activity  [ambulates with walker at home. Uses wheelchair when going out. Walks around home but doesn't go out often.]  Physical Activity Discussed/Reviewed Physical Activity Discussed, Physical Activity Reviewed  Education Interventions   Education Provided Provided Education  Provided Verbal Education On When to see the doctor, Mental Health/Coping with Illness, Medication, Exercise, Nutrition  [oral health and importance of following up with a dentist as recommended. Monitor for lower extremity edema. Daily weights. Blood pressure monitoring. Discussed upcoming heart cath and plans for aortic valve replacement.]  Mental Health Interventions   Mental Health Discussed/Reviewed Mental Health Discussed, Mental Health Reviewed  [A little nervous about upcoming procedures but overall well.]  Nutrition Interventions   Nutrition Discussed/Reviewed Nutrition Discussed, Nutrition Reviewed, Adding fruits and vegetables, Increasing proteins, Decreasing fats, Portion sizes, Fluid intake, Decreasing salt  Pharmacy Interventions   Pharmacy Dicussed/Reviewed Pharmacy Topics Discussed, Pharmacy Topics Reviewed, Medications and their functions  [taking medications as prescribed. No questions or concerns. Reviewed instructions on Cardiology AVS for medication. Asked patient to keep that available for reference. Call cardiologist with any questions.]  Safety Interventions   Safety Discussed/Reviewed Safety Discussed, Safety Reviewed, Fall Risk, Home Safety  Home Safety Assistive Devices       Follow up plan: Follow up call scheduled for 12/15/22    Encounter Outcome:  Patient Visit Completed   Demetrios Loll, RN, BSN Care Management Coordinator [facility]  [facility] Direct Dial: [phone] Main #: [phone]

## 2022-12-13 ENCOUNTER — Ambulatory Visit: Payer: Medicare HMO | Admitting: *Deleted

## 2022-12-13 ENCOUNTER — Telehealth: Payer: Self-pay | Admitting: *Deleted

## 2022-12-13 DIAGNOSIS — I350 Nonrheumatic aortic (valve) stenosis: Secondary | ICD-10-CM

## 2022-12-13 DIAGNOSIS — Z01810 Encounter for preprocedural cardiovascular examination: Secondary | ICD-10-CM

## 2022-12-13 NOTE — Patient Outreach (Signed)
Care Coordination   Follow Up Visit Note   12/13/2022 Name: ZAYVON YELLE MRN: [id number] DOB: 1934/01/31  DANIIL BOERMAN is a 89 y.o. year old male who sees [name], MD for primary care. I spoke with  [name] by phone today.  What matters to the patients health and wellness today?  Being able to have kidney function lab drawn at [facility] on [address] in [place] tomorrow for heart catheterization procedure on 12/16/22    Goals Addressed             This Visit's Progress    Manage Aortic Valve Stenosis   Not on track    Care Coordination Goals: Patient will keep all medical appointment RIGHT/LEFT HEART CATH AND CORONARY ANGIOGRAPHY scheduled for 12/16/22 Vein and vascular on 01/16/23 Cardiologist on 01/17/23 Patient will consult with dentist per Dr Earmon Phoenix recommendation regarding teeth that may need to be removed before proceeding with valve replacement Patient will go to [facility] on [address] in [place] tomorrow to have kidney function labs drawn. This has to be done before the procedure.  Patient will reach out to RN Care Management Coordinator [phone] with any resource or care coordination needs        SDOH assessments and interventions completed:  No    Care Coordination Interventions:  Yes, provided  Interventions Today    Flowsheet Row Most Recent Value  Chronic Disease   Chronic disease during today's visit Other  [Aortic Valve Stenosis]  General Interventions   General Interventions Discussed/Reviewed General Interventions Discussed, General Interventions Reviewed, Communication with, Labs  Labs Kidney Function  [Needs kidney function labs done before procedure on 12/16/22]  Communication with RN  Katina Dung, RN with Dr Earmon Phoenix office (cardiology) Re: need for new BMP or CMP order sent to [facility] so that patient can have labs drawn tomorrow on [place], [place]. Even though order is in [facility] I verified with [facility] that they can not  see it.]  Education Interventions   Provided Verbal Education On Labs       Follow up plan: Follow up call scheduled for 12/15/22    Encounter Outcome:  Patient Visit Completed   Demetrios Loll, RN, BSN Care Management Coordinator [facility]  [facility] Direct Dial: [phone] Main #: [phone]

## 2022-12-13 NOTE — Telephone Encounter (Signed)
R/LHC scheduled 12/16/22-pre-procedure CBC done 12/08/22, unfortunately BMP was not done at that time. With history of CKD, would be ideal to get BMP prior to [facility] 12/16/22, call placed to discuss making arrangements for BMP. Patient reports he does not drive and would not have transportation to facility to get lab done before 12/16/22. After discussion with patient, plan will be to get BMP on arrival to hospital 12/16/22, pt will plan to arrive at 8 AM instead of 8:30 AM to allow a little extra time to get BMP.

## 2022-12-14 ENCOUNTER — Other Ambulatory Visit: Payer: Self-pay | Admitting: *Deleted

## 2022-12-14 DIAGNOSIS — I350 Nonrheumatic aortic (valve) stenosis: Secondary | ICD-10-CM | POA: Diagnosis not present

## 2022-12-14 DIAGNOSIS — Z01810 Encounter for preprocedural cardiovascular examination: Secondary | ICD-10-CM

## 2022-12-14 NOTE — Addendum Note (Signed)
Addended by: Jacqlyn Krauss on: 12/14/2022 07:40 AM   Modules accepted: Orders

## 2022-12-15 ENCOUNTER — Encounter: Payer: Self-pay | Admitting: *Deleted

## 2022-12-15 ENCOUNTER — Encounter: Payer: Self-pay | Admitting: Cardiology

## 2022-12-15 ENCOUNTER — Telehealth: Payer: Self-pay | Admitting: *Deleted

## 2022-12-15 ENCOUNTER — Ambulatory Visit: Payer: Self-pay | Admitting: *Deleted

## 2022-12-15 ENCOUNTER — Other Ambulatory Visit: Payer: Self-pay | Admitting: Cardiology

## 2022-12-15 DIAGNOSIS — I350 Nonrheumatic aortic (valve) stenosis: Secondary | ICD-10-CM

## 2022-12-15 LAB — BASIC METABOLIC PANEL
BUN/Creatinine Ratio: 15 (ref 10–24)
BUN: 21 mg/dL (ref 8–27)
CO2: 25 mmol/L (ref 20–29)
Calcium: 8.9 mg/dL (ref 8.6–10.2)
Chloride: 101 mmol/L (ref 96–106)
Creatinine, Ser: 1.43 mg/dL — ABNORMAL HIGH (ref 0.76–1.27)
Glucose: 173 mg/dL — ABNORMAL HIGH (ref 70–99)
Potassium: 4.6 mmol/L (ref 3.5–5.2)
Sodium: 140 mmol/L (ref 134–144)
eGFR: 47 mL/min/{1.73_m2} — ABNORMAL LOW (ref >59)

## 2022-12-15 NOTE — Telephone Encounter (Signed)
Call placed to patient to review procedure instructions-[phone] no answer, [phone] mailbox full, unable to leave message.

## 2022-12-15 NOTE — Telephone Encounter (Signed)
Cardiac Catheterization scheduled at [facility] [id number] December 16, 2022 10:30 AM Arrival time [facility] Main Entrance A at: 8:30 AM  Nothing to eat after midnight prior to procedure, clear liquids until 5 AM day of procedure.  Medication instructions: -Hold:  Eliquis-none 12/14/22 until post procedure  Torsemide/KCl-day before and day of procedure -per protocol GFR <60  Insulin-AM of procedure/1/2 usual Insulin dose HS prior to procedure -Other usual morning medications can be taken with sips of water including aspirin 81 mg and Plavix 75 mg  Plan to go home the same day, you will only stay overnight if medically necessary.  You must have responsible adult to drive you home.  Someone must be with you the first 24 hours after you arrive home.  Call placed to patient to review instructions, no answer.

## 2022-12-15 NOTE — Telephone Encounter (Signed)
Reviewed procedure instructions with patient.

## 2022-12-15 NOTE — Telephone Encounter (Addendum)
BMP was done 12/14/22, pt does not need to arrive at 8 AM on 12/16/22, will let patient know arrival time 12/16/22 8:30 AM.

## 2022-12-15 NOTE — Patient Outreach (Signed)
Care Coordination   Follow Up Visit Note   12/15/2022 Name: Mike Wright MRN: [id number] DOB: Jun 10, 1933  SAHIL SUM is a 89 y.o. year old male who sees [name], MD for primary care. I spoke with  [name] by phone today.  What matters to the patients health and wellness today?  Preparing for cardiac catheterization tomorrow    Goals Addressed             This Visit's Progress    Manage Aortic Valve Stenosis   On track    Care Coordination Goals: Patient will keep all medical appointment RIGHT/LEFT HEART CATH AND CORONARY ANGIOGRAPHY scheduled for 12/16/22 Vein and vascular on 01/16/23 Cardiologist on 01/17/23 Patient will consult with dentist per Dr Earmon Phoenix recommendation regarding teeth that may need to be removed before proceeding with valve replacement Patient will follow pre-procedure instructions provided at last cardiology visit Patient has printed instructions on hand Patient will reach out to RN Care Management Coordinator [phone] with any resource or care coordination needs        SDOH assessments and interventions completed:  Yes  SDOH Interventions Today    Flowsheet Row Most Recent Value  SDOH Interventions   Transportation Interventions Intervention Not Indicated  Financial Strain Interventions Intervention Not Indicated        Care Coordination Interventions:  Yes, provided  Interventions Today    Flowsheet Row Most Recent Value  Chronic Disease   Chronic disease during today's visit Other  [Aortic Valve Stenosis]  General Interventions   General Interventions Discussed/Reviewed General Interventions Discussed, General Interventions Reviewed, Labs, Doctor Visits, Communication with  Labs Kidney Function  [BMP was done at [facility] in [place] on [address] yesterday]  Doctor Visits Discussed/Reviewed Doctor Visits Discussed, Doctor Visits Reviewed, Specialist  [Reviewed and discussed instructions for cardiac catheterization  prep from most recent cardiology office note]  PCP/Specialist Visits Compliance with follow-up visit  [Cardiac cath and angiography on 12/16/22, vein and vascular on 01/16/23, and cardiologist on 01/17/23. Schedule appointment with dentist Re: possible need for existing teeth removal prior to valve replacement]  Communication with RN  Katina Dung, RN with Dr Earmon Phoenix office (cardiology) confirmed that BMP results have been received]  Education Interventions   Education Provided Provided Education  Provided Verbal Education On Medication, Other  [Cardiac Catheterization procedure preparation per last instructions from cardiology visit. Patient has a copy of the AVS and reviewd along with me.]  Mental Health Interventions   Mental Health Discussed/Reviewed Mental Health Discussed, Mental Health Reviewed  [feels well and prepared for procedure]  Nutrition Interventions   Nutrition Discussed/Reviewed Nutrition Discussed, Nutrition Reviewed  [no food after midnight tonight. Clear liquids up until 5 AM tomorrow morning.]  Pharmacy Interventions   Pharmacy Dicussed/Reviewed Pharmacy Topics Discussed, Pharmacy Topics Reviewed, Medications and their functions  [Per pre-procedure Instructions: Last dose of Eliquis was 9/17. will not take torsemide or potassium today. Half Lantus to 15 units tonight and none tomorrow morning. Tomorrow take aspirin 81 mg & Plavix with other morning meds not otherwise mentioned]  Safety Interventions   Safety Discussed/Reviewed Safety Discussed, Safety Reviewed       Follow up plan: Follow up call scheduled for 12/19/22    Encounter Outcome:  Patient Visit Completed   Demetrios Loll, RN, BSN Care Management Coordinator [facility]  [facility] Direct Dial: [phone] Main #: [phone]

## 2022-12-16 ENCOUNTER — Encounter (HOSPITAL_COMMUNITY)
Admission: RE | Disposition: A | Discharge status: Home / Self Care | Payer: Self-pay | Source: Home / Self Care | Attending: Cardiovascular Disease

## 2022-12-16 ENCOUNTER — Other Ambulatory Visit: Payer: Self-pay

## 2022-12-16 ENCOUNTER — Encounter (HOSPITAL_COMMUNITY): Payer: Self-pay | Admitting: Cardiovascular Disease

## 2022-12-16 ENCOUNTER — Ambulatory Visit (HOSPITAL_COMMUNITY)
Admission: RE | Admit: 2022-12-16 | Discharge: 2022-12-16 | Disposition: A | Discharge status: Home / Self Care | Payer: Medicare HMO | Attending: Cardiovascular Disease | Admitting: Cardiovascular Disease

## 2022-12-16 DIAGNOSIS — Z87891 Personal history of nicotine dependence: Secondary | ICD-10-CM | POA: Insufficient documentation

## 2022-12-16 DIAGNOSIS — I4821 Permanent atrial fibrillation: Secondary | ICD-10-CM | POA: Diagnosis not present

## 2022-12-16 DIAGNOSIS — Z794 Long term (current) use of insulin: Secondary | ICD-10-CM | POA: Diagnosis not present

## 2022-12-16 DIAGNOSIS — N1831 Chronic kidney disease, stage 3a: Secondary | ICD-10-CM | POA: Insufficient documentation

## 2022-12-16 DIAGNOSIS — I130 Hypertensive heart and chronic kidney disease with heart failure and stage 1 through stage 4 chronic kidney disease, or unspecified chronic kidney disease: Secondary | ICD-10-CM | POA: Diagnosis not present

## 2022-12-16 DIAGNOSIS — Z7901 Long term (current) use of anticoagulants: Secondary | ICD-10-CM | POA: Diagnosis not present

## 2022-12-16 DIAGNOSIS — E1140 Type 2 diabetes mellitus with diabetic neuropathy, unspecified: Secondary | ICD-10-CM | POA: Diagnosis not present

## 2022-12-16 DIAGNOSIS — I350 Nonrheumatic aortic (valve) stenosis: Secondary | ICD-10-CM | POA: Diagnosis not present

## 2022-12-16 DIAGNOSIS — I2510 Atherosclerotic heart disease of native coronary artery without angina pectoris: Secondary | ICD-10-CM | POA: Insufficient documentation

## 2022-12-16 DIAGNOSIS — I272 Pulmonary hypertension, unspecified: Secondary | ICD-10-CM | POA: Insufficient documentation

## 2022-12-16 DIAGNOSIS — E1122 Type 2 diabetes mellitus with diabetic chronic kidney disease: Secondary | ICD-10-CM | POA: Insufficient documentation

## 2022-12-16 DIAGNOSIS — I5032 Chronic diastolic (congestive) heart failure: Secondary | ICD-10-CM | POA: Diagnosis not present

## 2022-12-16 DIAGNOSIS — D649 Anemia, unspecified: Secondary | ICD-10-CM | POA: Diagnosis not present

## 2022-12-16 HISTORY — PX: RIGHT/LEFT HEART CATH AND CORONARY ANGIOGRAPHY: CATH118266

## 2022-12-16 LAB — POCT I-STAT 7, (LYTES, BLD GAS, ICA,H+H)
Acid-base deficit: 1 mmol/L (ref 0.0–2.0)
Bicarbonate: 24.1 mmol/L (ref 20.0–28.0)
Calcium, Ion: 1.19 mmol/L (ref 1.15–1.40)
HCT: 30 % — ABNORMAL LOW (ref 39.0–52.0)
Hemoglobin: 10.2 g/dL — ABNORMAL LOW (ref 13.0–17.0)
O2 Saturation: 92 %
Potassium: 3.7 mmol/L (ref 3.5–5.1)
Sodium: 142 mmol/L (ref 135–145)
TCO2: 25 mmol/L (ref 22–32)
pCO2 arterial: 41.1 mmHg (ref 32–48)
pH, Arterial: 7.376 (ref 7.35–7.45)
pO2, Arterial: 65 mmHg — ABNORMAL LOW (ref 83–108)

## 2022-12-16 LAB — POCT I-STAT EG7
Acid-Base Excess: 0 mmol/L (ref 0.0–2.0)
Acid-Base Excess: 0 mmol/L (ref 0.0–2.0)
Bicarbonate: 25.8 mmol/L (ref 20.0–28.0)
Bicarbonate: 25.1 mmol/L (ref 20.0–28.0)
Calcium, Ion: 1.2 mmol/L (ref 1.15–1.40)
Calcium, Ion: 1.21 mmol/L (ref 1.15–1.40)
HCT: 33 % — ABNORMAL LOW (ref 39.0–52.0)
HCT: 33 % — ABNORMAL LOW (ref 39.0–52.0)
Hemoglobin: 11.2 g/dL — ABNORMAL LOW (ref 13.0–17.0)
Hemoglobin: 11.2 g/dL — ABNORMAL LOW (ref 13.0–17.0)
O2 Saturation: 61 %
O2 Saturation: 63 %
Potassium: 3.9 mmol/L (ref 3.5–5.1)
Potassium: 3.9 mmol/L (ref 3.5–5.1)
Sodium: 143 mmol/L (ref 135–145)
Sodium: 143 mmol/L (ref 135–145)
TCO2: 27 mmol/L (ref 22–32)
TCO2: 26 mmol/L (ref 22–32)
pCO2, Ven: 44.3 mmHg (ref 44–60)
pCO2, Ven: 43.9 mmHg — ABNORMAL LOW (ref 44–60)
pH, Ven: 7.374 (ref 7.25–7.43)
pH, Ven: 7.365 (ref 7.25–7.43)
pO2, Ven: 33 mmHg (ref 32–45)
pO2, Ven: 34 mmHg (ref 32–45)

## 2022-12-16 LAB — GLUCOSE, CAPILLARY
Glucose-Capillary: 132 mg/dL — ABNORMAL HIGH (ref 70–99)
Glucose-Capillary: 114 mg/dL — ABNORMAL HIGH (ref 70–99)

## 2022-12-16 SURGERY — RIGHT/LEFT HEART CATH AND CORONARY ANGIOGRAPHY
Anesthesia: LOCAL

## 2022-12-16 MED ORDER — FENTANYL CITRATE (PF) 100 MCG/2ML IJ SOLN
INTRAMUSCULAR | Status: DC | PRN
  Administered 2022-12-16: 25 ug via INTRAVENOUS

## 2022-12-16 MED ORDER — MIDAZOLAM HCL 2 MG/2ML IJ SOLN
INTRAMUSCULAR | Status: DC | PRN
  Administered 2022-12-16: 1 mg via INTRAVENOUS

## 2022-12-16 MED ORDER — SODIUM CHLORIDE 0.9 % WEIGHT BASED INFUSION: 1.0000 mL/kg/h | INTRAVENOUS | Status: DC

## 2022-12-16 MED ORDER — SODIUM CHLORIDE 0.9 % WEIGHT BASED INFUSION
3.0000 mL/kg/h | INTRAVENOUS | Status: AC
  Administered 2022-12-16: 3 mL/kg/h via INTRAVENOUS

## 2022-12-16 MED ORDER — ASPIRIN 81 MG PO CHEW: 81.0000 mg | CHEWABLE_TABLET | ORAL | Status: DC

## 2022-12-16 MED ORDER — ONDANSETRON HCL 4 MG/2ML IJ SOLN: 4.0000 mg | Freq: Four times a day (QID) | INTRAMUSCULAR | Status: DC | PRN

## 2022-12-16 MED ORDER — IOHEXOL 350 MG/ML SOLN
INTRAVENOUS | Status: DC | PRN
  Administered 2022-12-16: 55 mL

## 2022-12-16 MED ORDER — ACETAMINOPHEN 325 MG PO TABS: 650.0000 mg | ORAL_TABLET | ORAL | Status: DC | PRN

## 2022-12-16 MED ORDER — LIDOCAINE HCL (PF) 1 % IJ SOLN
INTRAMUSCULAR | Status: AC
  Filled 2022-12-16: qty 30

## 2022-12-16 MED ORDER — CLOPIDOGREL BISULFATE 75 MG PO TABS: 75.0000 mg | ORAL_TABLET | ORAL | Status: DC

## 2022-12-16 MED ORDER — LABETALOL HCL 5 MG/ML IV SOLN: 10.0000 mg | INTRAVENOUS | Status: DC | PRN

## 2022-12-16 MED ORDER — MIDAZOLAM HCL 2 MG/2ML IJ SOLN
INTRAMUSCULAR | Status: AC
  Filled 2022-12-16: qty 2

## 2022-12-16 MED ORDER — LIDOCAINE HCL (PF) 1 % IJ SOLN
INTRAMUSCULAR | Status: DC | PRN
  Administered 2022-12-16: 12 mL
  Administered 2022-12-16: 2 mL

## 2022-12-16 MED ORDER — SODIUM CHLORIDE 0.9 % IV SOLN: 250.0000 mL | INTRAVENOUS | Status: DC | PRN

## 2022-12-16 MED ORDER — HYDRALAZINE HCL 20 MG/ML IJ SOLN: 10.0000 mg | INTRAMUSCULAR | Status: DC | PRN

## 2022-12-16 MED ORDER — FENTANYL CITRATE (PF) 100 MCG/2ML IJ SOLN
INTRAMUSCULAR | Status: AC
  Filled 2022-12-16: qty 2

## 2022-12-16 MED ORDER — SODIUM CHLORIDE 0.9% FLUSH: 3.0000 mL | INTRAVENOUS | Status: DC | PRN

## 2022-12-16 MED ORDER — SODIUM CHLORIDE 0.9% FLUSH: 3.0000 mL | Freq: Two times a day (BID) | INTRAVENOUS | Status: DC

## 2022-12-16 SURGICAL SUPPLY — 19 items
CATH BALLN WEDGE 5F 110CM (CATHETERS) IMPLANT
CATH INFINITI 5 FR AL2 (CATHETERS) IMPLANT
CATH INFINITI 5 FR AR1 MOD (CATHETERS) IMPLANT
CATH INFINITI 5FR AL1 (CATHETERS) IMPLANT
CATH INFINITI 5FR MULTPACK ANG (CATHETERS) IMPLANT
CLOSURE MYNX CONTROL 5F (Vascular Products) IMPLANT
GLIDESHEATH SLEND SS 6F .021 (SHEATH) IMPLANT
GUIDEWIRE INQWIRE 1.5J.035X260 (WIRE) IMPLANT
INQWIRE 1.5J .035X260CM (WIRE) ×1
KIT HEART LEFT (KITS) IMPLANT
KIT MICROPUNCTURE NIT STIFF (SHEATH) IMPLANT
PACK CARDIAC CATHETERIZATION (CUSTOM PROCEDURE TRAY) ×1 IMPLANT
SHEATH GLIDE SLENDER 4/5FR (SHEATH) IMPLANT
SHEATH PINNACLE 5F 10CM (SHEATH) IMPLANT
SHEATH PROBE COVER 6X72 (BAG) IMPLANT
TRANSDUCER W/STOPCOCK (MISCELLANEOUS) IMPLANT
TUBING CIL FLEX 10 FLL-RA (TUBING) IMPLANT
WIRE EMERALD 3MM-J .035X150CM (WIRE) IMPLANT
WIRE EMERALD ST .035X150CM (WIRE) IMPLANT

## 2022-12-16 NOTE — Interval H&P Note (Signed)
History and Physical Interval Note:  12/16/2022 8:45 AM  Mike Wright  has presented today for surgery, with the diagnosis of aortic stenosis.  The various methods of treatment have been discussed with the patient and family. After consideration of risks, benefits and other options for treatment, the patient has consented to  Procedure(s): RIGHT/LEFT HEART CATH AND CORONARY ANGIOGRAPHY (N/A) as a surgical intervention.  The patient's history has been reviewed, patient examined, no change in status, stable for surgery.  I have reviewed the patient's chart and labs.  Questions were answered to the patient's satisfaction.     Tonny Bollman

## 2022-12-19 ENCOUNTER — Ambulatory Visit: Payer: Self-pay | Admitting: *Deleted

## 2022-12-20 ENCOUNTER — Encounter: Payer: Self-pay | Admitting: *Deleted

## 2022-12-20 NOTE — Patient Outreach (Signed)
Care Coordination   Follow Up Visit Note   12/19/2022 Name: Mike Wright MRN: [id number] DOB: [birth year]  Mike Wright is a 89 y.o. year old male who sees [name], MD for primary care. I spoke with  [name] by phone today.  What matters to the patients health and wellness today?  Finding out more about his upcoming appointments    Goals Addressed             This Visit's Progress    Manage Aortic Valve Stenosis   On track    Care Coordination Goals: Patient will keep all medical appointment Cardiac CT on 12/29/22 new patient appointment with cardiothoracic surgeon,Dr Laneta Simmers on 01/11/23 Vein and vascular on 01/16/23 Cardiologist on 01/17/23 Patient will consult with dentist per Dr Earmon Phoenix recommendation regarding teeth that may need to be removed before proceeding with valve replacement Patient will reach out to RN Care Management Coordinator [phone] with any resource or care coordination needs        SDOH assessments and interventions completed:  No    Care Coordination Interventions:  Yes, provided  Interventions Today    Flowsheet Row Most Recent Value  Chronic Disease   Chronic disease during today's visit Other  [Aortic Valve Stenosis]  General Interventions   General Interventions Discussed/Reviewed General Interventions Discussed, General Interventions Reviewed, Doctor Visits  Doctor Visits Discussed/Reviewed Doctor Visits Discussed, Doctor Visits Reviewed, Specialist  [reviewed and discussed cardiac catheterization that was done last week]  PCP/Specialist Visits Compliance with follow-up visit  [Cardiac CT on 12/29/22 and new patient appointment with cardiothoracic surgeon,Dr Laneta Simmers on 01/11/23]  Education Interventions   Education Provided Provided Education  Provided Verbal Education On Other, When to see the doctor  [Discussed the difference in a cardiologist and a cardiothoracic surgeon and why he has been referred to a cardiothoracic  surgeon regarging his aortic valve stenosis and potential need for replacement]  Safety Interventions   Safety Discussed/Reviewed Safety Reviewed, Safety Discussed       Follow up plan: Follow up call scheduled for 01/06/23    Encounter Outcome:  Patient Visit Completed   Demetrios Loll, RN, BSN Care Management Coordinator [facility]  [facility] Direct Dial: [phone] Main #: [phone]

## 2022-12-27 ENCOUNTER — Other Ambulatory Visit: Payer: Self-pay | Admitting: Cardiology

## 2022-12-28 ENCOUNTER — Encounter: Payer: Self-pay | Admitting: Hematology

## 2022-12-29 ENCOUNTER — Ambulatory Visit (HOSPITAL_COMMUNITY)
Admission: RE | Admit: 2022-12-29 | Discharge: 2022-12-29 | Disposition: A | Discharge status: Home / Self Care | Payer: Medicare HMO | Source: Ambulatory Visit | Attending: Internal Medicine | Admitting: Internal Medicine

## 2022-12-29 DIAGNOSIS — I350 Nonrheumatic aortic (valve) stenosis: Secondary | ICD-10-CM | POA: Insufficient documentation

## 2022-12-29 DIAGNOSIS — I700 Atherosclerosis of aorta: Secondary | ICD-10-CM | POA: Diagnosis not present

## 2022-12-29 DIAGNOSIS — I517 Cardiomegaly: Secondary | ICD-10-CM | POA: Diagnosis not present

## 2022-12-29 DIAGNOSIS — Z48812 Encounter for surgical aftercare following surgery on the circulatory system: Secondary | ICD-10-CM | POA: Diagnosis not present

## 2022-12-29 DIAGNOSIS — J432 Centrilobular emphysema: Secondary | ICD-10-CM | POA: Diagnosis not present

## 2022-12-29 MED ORDER — IOHEXOL 350 MG/ML SOLN
95.0000 mL | Freq: Once | INTRAVENOUS | Status: AC | PRN
  Administered 2022-12-29: 95 mL via INTRAVENOUS

## 2023-01-02 ENCOUNTER — Encounter: Payer: Self-pay | Admitting: Cardiology

## 2023-01-06 ENCOUNTER — Encounter: Payer: Self-pay | Admitting: *Deleted

## 2023-01-06 ENCOUNTER — Ambulatory Visit: Payer: Self-pay | Admitting: *Deleted

## 2023-01-06 NOTE — Patient Outreach (Signed)
Care Coordination   Follow Up Visit Note   01/06/2023 Name: Mike Wright MRN: [id number] DOB: December 23, 1933  Mike Wright is a 89 y.o. year old male who sees [name], MD for primary care. I spoke with  [name] by phone today.  What matters to the patients health and wellness today?  Talking with surgeon about Cardiac CT results and possible aortic valve replacement and what the benefits will be.    Goals Addressed             This Visit's Progress    Manage Aortic Valve Stenosis   On track    Care Coordination Goals: Patient will keep all medical appointment new patient appointment with cardiothoracic surgeon,Dr Bartle on 01/11/23 Vein and vascular on 01/16/23 Cardiologist on 01/17/23 Patient will talk with Dr Laneta Simmers Re: results of cardiac CT, valve replacement surgery, risk versus benefit, prognosis, and recovery Patient will consult with dentist per Dr Earmon Phoenix recommendation regarding teeth that may need to be removed before proceeding with valve replacement Patient will reach out to RN Care Management Coordinator [phone] with any resource or care coordination needs        SDOH assessments and interventions completed:  Yes  SDOH Interventions Today    Flowsheet Row Most Recent Value  SDOH Interventions   Transportation Interventions Intervention Not Indicated  Utilities Interventions Intervention Not Indicated  Health Literacy Interventions Intervention Not Indicated        Care Coordination Interventions:  Yes, provided  Interventions Today    Flowsheet Row Most Recent Value  Chronic Disease   Chronic disease during today's visit Other  [aortic valve stenosis]  General Interventions   General Interventions Discussed/Reviewed General Interventions Discussed, General Interventions Reviewed, Doctor Visits  [Reviewed and discussed cardiac CT. Patient has reviewed results on My Chart. Advised to discuss further with Dr Laneta Simmers at new patient  appointment on 01/11/23.]  Doctor Visits Discussed/Reviewed Doctor Visits Discussed, Doctor Visits Reviewed, Specialist  PCP/Specialist Visits Compliance with follow-up visit  [Cardiothoracic surgeon Dr Laneta Simmers on 01/11/23 for TAVR consult and review imaging. 01/16/23 Arterial Doppler [facility] CV with ABIs & follow up with [facility]. Cardiology Sharlene Dory, NP on 10/23]  Education Interventions   Education Provided Provided Education  Provided Verbal Education On When to see the doctor, Mental Health/Coping with Illness, Other  [Discussed the difference in a cardiologist and a cardiothoracic surgeon and why he has been referred to a cardiothoracic surgeon regarging his aortic valve stenosis and potential need for replacement]  Mental Health Interventions   Mental Health Discussed/Reviewed Mental Health Discussed, Mental Health Reviewed, Other  [Therapeutic listening utilized regarding potential need for aortic valve replacement, prognosis, risk versus benefit. Patient has talked with cardiologist and will discuss further with cardiovascular surgeon.]  Advanced Directive Interventions   Advanced Directives Discussed/Reviewed Advanced Directives Discussed, Advanced Directives Reviewed  [on file]       Follow up plan: Follow up call scheduled for 01/19/23    Encounter Outcome:  Patient Visit Completed   Demetrios Loll, RN, BSN Care Management Coordinator [facility]  [facility] Direct Dial: [phone] Main #: [phone]

## 2023-01-09 ENCOUNTER — Other Ambulatory Visit: Payer: Self-pay | Admitting: Nurse Practitioner

## 2023-01-09 ENCOUNTER — Encounter: Payer: Self-pay | Admitting: Physician Assistant

## 2023-01-09 NOTE — Progress Notes (Signed)
Procedure Type: Isolated AVR Perioperative Outcome Estimate % Operative Mortality 8.14% Morbidity & Mortality 17.4% Stroke 1.64% Renal Failure 6.55% Reoperation 4.39% Prolonged Ventilation 9.33% Deep Sternal Wound Infection 0.113% Long Hospital Stay (>14 days) 15.2% Short Hospital Stay (<6 days)* 19.1%

## 2023-01-11 ENCOUNTER — Institutional Professional Consult (permissible substitution): Payer: Medicare HMO | Admitting: Surgery

## 2023-01-11 ENCOUNTER — Encounter: Payer: Self-pay | Admitting: Surgery

## 2023-01-11 VITALS — BP 136/71 | HR 87 | Resp 18 | Ht 67.0 in | Wt 162.0 lb

## 2023-01-11 DIAGNOSIS — I350 Nonrheumatic aortic (valve) stenosis: Secondary | ICD-10-CM

## 2023-01-11 NOTE — Progress Notes (Unsigned)
Pre Surgical Assessment: 5 M Walk Test  25M=16.51ft  5 Meter Walk Test- trial 1: 13.08 seconds 5 Meter Walk Test- trial 2: 11.36 seconds 5 Meter Walk Test- trial 3: 12.36 seconds 5 Meter Walk Test Average: 12.26 seconds

## 2023-01-12 ENCOUNTER — Encounter: Payer: Self-pay | Admitting: *Deleted

## 2023-01-12 NOTE — Progress Notes (Signed)
warm, well-perfused, pedal pulses not palpable, mild bilateral lower extremity edema  Rectal/GU  Deferred  Neuro:   Grossly non-focal and symmetrical throughout  Skin:   Clean and dry, no rashes, no breakdown  Diagnostic Tests:  ECHOCARDIOGRAM REPORT       Patient Name:   Mike Wright Date of Exam: 07/19/2022  Medical Rec #:  [id number]       Height:       67.0 in  Accession #:    [id number]      Weight:       194.9 lb  Date of Birth:  09/02/1933       BSA:          2.000 m  Patient Age:    89 years        BP:           136/56 mmHg  Patient Gender: M               HR:           69 bpm.  Exam Location:  [facility]   Procedure: 2D Echo, Cardiac Doppler and Color Doppler   Indications:    CHF-Acute Diastolic I50.31    History:        Patient has prior history of Echocardiogram examinations,  most                 recent 11/11/2021. CHF, CAD, Arrythmias:Atrial  Fibrillation,                 Bradycardia and Atrial Flutter; Risk Factors:Hypertension,                  Diabetes and Dyslipidemia. Sleep apnea,                  PAD (peripheral artery disease).    Sonographer:    Celesta Gentile RCS  Referring Phys: [id number] OLADAPO ADEFESO   IMPRESSIONS     1. Left ventricular ejection fraction, by estimation, is 70 to 75%. The  left ventricle has hyperdynamic function. The left ventricle has no  regional wall motion abnormalities. There is mild left ventricular  hypertrophy. Left ventricular diastolic  function could not be evaluated due to atrial flutter.   2. Right ventricular systolic function is normal. The right ventricular  size is normal. There is severely elevated pulmonary artery systolic  pressure. The estimated right ventricular systolic pressure is 73.1 mmHg.   3. Left atrial size was severely dilated.   4. Right atrial size was severely dilated.   5. The mitral valve is abnormal. No evidence of mitral valve   regurgitation. No evidence of mitral stenosis. Severe mitral annular  calcification.   6. The tricuspid valve is abnormal. Tricuspid valve regurgitation is mild  to moderate.   7. The aortic valve is calcified. Aortic valve regurgitation is mild.  Moderate to severe aortic valve stenosis. Aortic valve area, by VTI  measures 1.13 cm. Aortic valve mean gradient measures 33.0 mmHg. Aortic  valve Vmax measures 4.03 m/s. AV DVI 0.33.   8. The inferior vena cava is dilated in size with <50% respiratory  variability, suggesting right atrial pressure of 15 mmHg.   9. Increased flow velocities may be secondary to anemia, thyrotoxicosis,  hyperdynamic or high flow state.   Comparison(s): Changes from prior study are noted. AS worsened from  moderate (DVI 0.33, Vmax 3.46 m/sec, mean PG 25 mm Hg) in 10/2021 to  moderate-severe (DVI 0.33, Vmax  Patient ID: Mike Wright, male   DOB: 1933/04/10, 89 y.o.   MRN: [id number]  [facility]       [address]       [name] [address]             [phone]          CARDIOTHORACIC SURGERY CONSULTATION REPORT  PCP is [name], MD Referring Provider is Tonny Bollman, MD Primary Cardiologist is Dina Rich, MD  Reason for consultation: Severe aortic stenosis  HPI:  The patient is an 89 year old gentleman with a history of type 2 diabetes, hypertension, hyperlipidemia, COPD, OSA not on CPAP, stage IIIa chronic kidney disease, chronic anemia, chronic atrial fibrillation, coronary artery disease status post stenting in 2019, history of heparin-induced thrombocytopenia, chronic diastolic congestive heart failure, and severe aortic stenosis who was referred for consideration of TAVR.  His most recent echocardiogram was in April 2024 and showed a calcified aortic valve with a mean gradient of 33 mmHg and a peak gradient of 65 mmHg.  Aortic valve area by VTI was 1.13 cm.  There was mild aortic insufficiency.  Left ventricular ejection fraction was 70 to 75%.  He is here today with his wife.  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  He said that he rarely gets out of the house and uses a walker inside the house.  If he gets out he has to use a wheelchair.  Past Medical History:  Diagnosis Date   Anemia    a. mild, noted 04/2017.   CAD in native artery    a. [place] 04/2017 s/p DES to D1, DES to prox-mid LAD, DES to prox LAD overlapping the prior stent, LVEF 55-65%.    Chronic a-fib (HCC)    Chronic diastolic CHF (congestive heart failure) (HCC)    Constipation    COPD (chronic obstructive pulmonary disease) (HCC)    Diabetic ulcer of toe (HCC)    DJD (degenerative joint disease) of  cervical spine    Essential hypertension    GERD (gastroesophageal reflux disease)    History of hiatal hernia    HIT (heparin-induced thrombocytopenia) (HCC)    Hypothyroidism    Hypoxia    a. went home on home O2 04/2017.   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   Retinitis    Sinus bradycardia    a. HR 30s-40s in 04/2017 -> diltiazem stopped, metoprolol reduced.   Sleep apnea    "chose not to order CPAP at this time" (05/18/2017)   Type 2 diabetes mellitus (HCC)    Wears glasses     Past Surgical History:  Procedure Laterality Date   ABDOMINAL AORTOGRAM W/LOWER EXTREMITY N/A 09/15/2017   Procedure: ABDOMINAL AORTOGRAM W/LOWER EXTREMITY;  Surgeon: Nada Libman, MD;  Location: [facility];  Service: Cardiovascular;  Laterality: N/A;   ABDOMINAL AORTOGRAM W/LOWER EXTREMITY N/A 02/04/2020   Procedure: ABDOMINAL AORTOGRAM W/LOWER EXTREMITY;  Surgeon: Nada Libman, MD;  Location: [facility];  Service: Cardiovascular;  Laterality: N/A;   ABDOMINAL AORTOGRAM W/LOWER EXTREMITY N/A 12/15/2020   Procedure: ABDOMINAL AORTOGRAM W/LOWER EXTREMITY;  Surgeon: Nada Libman, MD;  Location: [facility]  Patient ID: Mike Wright, male   DOB: 1933/04/10, 89 y.o.   MRN: [id number]  [facility]       [address]       [name] [address]             [phone]          CARDIOTHORACIC SURGERY CONSULTATION REPORT  PCP is [name], MD Referring Provider is Tonny Bollman, MD Primary Cardiologist is Dina Rich, MD  Reason for consultation: Severe aortic stenosis  HPI:  The patient is an 89 year old gentleman with a history of type 2 diabetes, hypertension, hyperlipidemia, COPD, OSA not on CPAP, stage IIIa chronic kidney disease, chronic anemia, chronic atrial fibrillation, coronary artery disease status post stenting in 2019, history of heparin-induced thrombocytopenia, chronic diastolic congestive heart failure, and severe aortic stenosis who was referred for consideration of TAVR.  His most recent echocardiogram was in April 2024 and showed a calcified aortic valve with a mean gradient of 33 mmHg and a peak gradient of 65 mmHg.  Aortic valve area by VTI was 1.13 cm.  There was mild aortic insufficiency.  Left ventricular ejection fraction was 70 to 75%.  He is here today with his wife.  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  He said that he rarely gets out of the house and uses a walker inside the house.  If he gets out he has to use a wheelchair.  Past Medical History:  Diagnosis Date   Anemia    a. mild, noted 04/2017.   CAD in native artery    a. [place] 04/2017 s/p DES to D1, DES to prox-mid LAD, DES to prox LAD overlapping the prior stent, LVEF 55-65%.    Chronic a-fib (HCC)    Chronic diastolic CHF (congestive heart failure) (HCC)    Constipation    COPD (chronic obstructive pulmonary disease) (HCC)    Diabetic ulcer of toe (HCC)    DJD (degenerative joint disease) of  cervical spine    Essential hypertension    GERD (gastroesophageal reflux disease)    History of hiatal hernia    HIT (heparin-induced thrombocytopenia) (HCC)    Hypothyroidism    Hypoxia    a. went home on home O2 04/2017.   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   Retinitis    Sinus bradycardia    a. HR 30s-40s in 04/2017 -> diltiazem stopped, metoprolol reduced.   Sleep apnea    "chose not to order CPAP at this time" (05/18/2017)   Type 2 diabetes mellitus (HCC)    Wears glasses     Past Surgical History:  Procedure Laterality Date   ABDOMINAL AORTOGRAM W/LOWER EXTREMITY N/A 09/15/2017   Procedure: ABDOMINAL AORTOGRAM W/LOWER EXTREMITY;  Surgeon: Nada Libman, MD;  Location: [facility];  Service: Cardiovascular;  Laterality: N/A;   ABDOMINAL AORTOGRAM W/LOWER EXTREMITY N/A 02/04/2020   Procedure: ABDOMINAL AORTOGRAM W/LOWER EXTREMITY;  Surgeon: Nada Libman, MD;  Location: [facility];  Service: Cardiovascular;  Laterality: N/A;   ABDOMINAL AORTOGRAM W/LOWER EXTREMITY N/A 12/15/2020   Procedure: ABDOMINAL AORTOGRAM W/LOWER EXTREMITY;  Surgeon: Nada Libman, MD;  Location: [facility]  Patient ID: Mike Wright, male   DOB: 1933/04/10, 89 y.o.   MRN: [id number]  [facility]       [address]       [name] [address]             [phone]          CARDIOTHORACIC SURGERY CONSULTATION REPORT  PCP is [name], MD Referring Provider is Tonny Bollman, MD Primary Cardiologist is Dina Rich, MD  Reason for consultation: Severe aortic stenosis  HPI:  The patient is an 89 year old gentleman with a history of type 2 diabetes, hypertension, hyperlipidemia, COPD, OSA not on CPAP, stage IIIa chronic kidney disease, chronic anemia, chronic atrial fibrillation, coronary artery disease status post stenting in 2019, history of heparin-induced thrombocytopenia, chronic diastolic congestive heart failure, and severe aortic stenosis who was referred for consideration of TAVR.  His most recent echocardiogram was in April 2024 and showed a calcified aortic valve with a mean gradient of 33 mmHg and a peak gradient of 65 mmHg.  Aortic valve area by VTI was 1.13 cm.  There was mild aortic insufficiency.  Left ventricular ejection fraction was 70 to 75%.  He is here today with his wife.  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  He said that he rarely gets out of the house and uses a walker inside the house.  If he gets out he has to use a wheelchair.  Past Medical History:  Diagnosis Date   Anemia    a. mild, noted 04/2017.   CAD in native artery    a. [place] 04/2017 s/p DES to D1, DES to prox-mid LAD, DES to prox LAD overlapping the prior stent, LVEF 55-65%.    Chronic a-fib (HCC)    Chronic diastolic CHF (congestive heart failure) (HCC)    Constipation    COPD (chronic obstructive pulmonary disease) (HCC)    Diabetic ulcer of toe (HCC)    DJD (degenerative joint disease) of  cervical spine    Essential hypertension    GERD (gastroesophageal reflux disease)    History of hiatal hernia    HIT (heparin-induced thrombocytopenia) (HCC)    Hypothyroidism    Hypoxia    a. went home on home O2 04/2017.   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   Retinitis    Sinus bradycardia    a. HR 30s-40s in 04/2017 -> diltiazem stopped, metoprolol reduced.   Sleep apnea    "chose not to order CPAP at this time" (05/18/2017)   Type 2 diabetes mellitus (HCC)    Wears glasses     Past Surgical History:  Procedure Laterality Date   ABDOMINAL AORTOGRAM W/LOWER EXTREMITY N/A 09/15/2017   Procedure: ABDOMINAL AORTOGRAM W/LOWER EXTREMITY;  Surgeon: Nada Libman, MD;  Location: [facility];  Service: Cardiovascular;  Laterality: N/A;   ABDOMINAL AORTOGRAM W/LOWER EXTREMITY N/A 02/04/2020   Procedure: ABDOMINAL AORTOGRAM W/LOWER EXTREMITY;  Surgeon: Nada Libman, MD;  Location: [facility];  Service: Cardiovascular;  Laterality: N/A;   ABDOMINAL AORTOGRAM W/LOWER EXTREMITY N/A 12/15/2020   Procedure: ABDOMINAL AORTOGRAM W/LOWER EXTREMITY;  Surgeon: Nada Libman, MD;  Location: [facility]  warm, well-perfused, pedal pulses not palpable, mild bilateral lower extremity edema  Rectal/GU  Deferred  Neuro:   Grossly non-focal and symmetrical throughout  Skin:   Clean and dry, no rashes, no breakdown  Diagnostic Tests:  ECHOCARDIOGRAM REPORT       Patient Name:   Mike Wright Date of Exam: 07/19/2022  Medical Rec #:  [id number]       Height:       67.0 in  Accession #:    [id number]      Weight:       194.9 lb  Date of Birth:  09/02/1933       BSA:          2.000 m  Patient Age:    89 years        BP:           136/56 mmHg  Patient Gender: M               HR:           69 bpm.  [facility] Location[facility]   Procedure: 2D Echo, Cardiac Doppler and Color Doppler   Indications:    CHF-Acute Diastolic I50.31    History:        Patient has prior history of Echocardiogram examinations,  most                 recent 11/11/2021. CHF, CAD, Arrythmias:Atrial  Fibrillation,                 Bradycardia and Atrial Flutter; Risk Factors:Hypertension,                  Diabetes and Dyslipidemia. Sleep apnea,                  PAD (peripheral artery disease).    Sonographer:    Celesta Gentile RCS  Referring Phys: [id number] OLADAPO ADEFESO   IMPRESSIONS     1. Left ventricular ejection fraction, by estimation, is 70 to 75%. The  left ventricle has hyperdynamic function. The left ventricle has no  regional wall motion abnormalities. There is mild left ventricular  hypertrophy. Left ventricular diastolic  function could not be evaluated due to atrial flutter.   2. Right ventricular systolic function is normal. The right ventricular  size is normal. There is severely elevated pulmonary artery systolic  pressure. The estimated right ventricular systolic pressure is 73.1 mmHg.   3. Left atrial size was severely dilated.   4. Right atrial size was severely dilated.   5. The mitral valve is abnormal. No evidence of mitral valve   regurgitation. No evidence of mitral stenosis. Severe mitral annular  calcification.   6. The tricuspid valve is abnormal. Tricuspid valve regurgitation is mild  to moderate.   7. The aortic valve is calcified. Aortic valve regurgitation is mild.  Moderate to severe aortic valve stenosis. Aortic valve area, by VTI  measures 1.13 cm. Aortic valve mean gradient measures 33.0 mmHg. Aortic  valve Vmax measures 4.03 m/s. AV DVI 0.33.   8. The inferior vena cava is dilated in size with <50% respiratory  variability, suggesting right atrial pressure of 15 mmHg.   9. Increased flow velocities may be secondary to anemia, thyrotoxicosis,  hyperdynamic or high flow state.   Comparison(s): Changes from prior study are noted. AS worsened from  moderate (DVI 0.33, Vmax 3.46 m/sec, mean PG 25 mm Hg) in 10/2021 to  moderate-severe (DVI 0.33, Vmax  Patient ID: Mike Wright, male   DOB: 1933/04/10, 89 y.o.   MRN: [id number]  [facility]       [address]       [name] [address]             [phone]          CARDIOTHORACIC SURGERY CONSULTATION REPORT  PCP is [name], MD Referring Provider is Tonny Bollman, MD Primary Cardiologist is Dina Rich, MD  Reason for consultation: Severe aortic stenosis  HPI:  The patient is an 89 year old gentleman with a history of type 2 diabetes, hypertension, hyperlipidemia, COPD, OSA not on CPAP, stage IIIa chronic kidney disease, chronic anemia, chronic atrial fibrillation, coronary artery disease status post stenting in 2019, history of heparin-induced thrombocytopenia, chronic diastolic congestive heart failure, and severe aortic stenosis who was referred for consideration of TAVR.  His most recent echocardiogram was in April 2024 and showed a calcified aortic valve with a mean gradient of 33 mmHg and a peak gradient of 65 mmHg.  Aortic valve area by VTI was 1.13 cm.  There was mild aortic insufficiency.  Left ventricular ejection fraction was 70 to 75%.  He is here today with his wife.  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  He said that he rarely gets out of the house and uses a walker inside the house.  If he gets out he has to use a wheelchair.  Past Medical History:  Diagnosis Date   Anemia    a. mild, noted 04/2017.   CAD in native artery    a. [place] 04/2017 s/p DES to D1, DES to prox-mid LAD, DES to prox LAD overlapping the prior stent, LVEF 55-65%.    Chronic a-fib (HCC)    Chronic diastolic CHF (congestive heart failure) (HCC)    Constipation    COPD (chronic obstructive pulmonary disease) (HCC)    Diabetic ulcer of toe (HCC)    DJD (degenerative joint disease) of  cervical spine    Essential hypertension    GERD (gastroesophageal reflux disease)    History of hiatal hernia    HIT (heparin-induced thrombocytopenia) (HCC)    Hypothyroidism    Hypoxia    a. went home on home O2 04/2017.   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   Retinitis    Sinus bradycardia    a. HR 30s-40s in 04/2017 -> diltiazem stopped, metoprolol reduced.   Sleep apnea    "chose not to order CPAP at this time" (05/18/2017)   Type 2 diabetes mellitus (HCC)    Wears glasses     Past Surgical History:  Procedure Laterality Date   ABDOMINAL AORTOGRAM W/LOWER EXTREMITY N/A 09/15/2017   Procedure: ABDOMINAL AORTOGRAM W/LOWER EXTREMITY;  Surgeon: Nada Libman, MD;  Location: [facility];  Service: Cardiovascular;  Laterality: N/A;   ABDOMINAL AORTOGRAM W/LOWER EXTREMITY N/A 02/04/2020   Procedure: ABDOMINAL AORTOGRAM W/LOWER EXTREMITY;  Surgeon: Nada Libman, MD;  [facility]: [facility];  Service: Cardiovascular;  Laterality: N/A;   ABDOMINAL AORTOGRAM W/LOWER EXTREMITY N/A 12/15/2020   Procedure: ABDOMINAL AORTOGRAM W/LOWER EXTREMITY;  Surgeon: Nada Libman, MD;  Location: [facility]  Patient ID: Mike Wright, male   DOB: 1933/04/10, 89 y.o.   MRN: [id number]  [facility]       [address]       [name] [address]             [phone]          CARDIOTHORACIC SURGERY CONSULTATION REPORT  PCP is [name], MD Referring Provider is Tonny Bollman, MD Primary Cardiologist is Dina Rich, MD  Reason for consultation: Severe aortic stenosis  HPI:  The patient is an 89 year old gentleman with a history of type 2 diabetes, hypertension, hyperlipidemia, COPD, OSA not on CPAP, stage IIIa chronic kidney disease, chronic anemia, chronic atrial fibrillation, coronary artery disease status post stenting in 2019, history of heparin-induced thrombocytopenia, chronic diastolic congestive heart failure, and severe aortic stenosis who was referred for consideration of TAVR.  His most recent echocardiogram was in April 2024 and showed a calcified aortic valve with a mean gradient of 33 mmHg and a peak gradient of 65 mmHg.  Aortic valve area by VTI was 1.13 cm.  There was mild aortic insufficiency.  Left ventricular ejection fraction was 70 to 75%.  He is here today with his wife.  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  He said that he rarely gets out of the house and uses a walker inside the house.  If he gets out he has to use a wheelchair.  Past Medical History:  Diagnosis Date   Anemia    a. mild, noted 04/2017.   CAD in native artery    a. [place] 04/2017 s/p DES to D1, DES to prox-mid LAD, DES to prox LAD overlapping the prior stent, LVEF 55-65%.    Chronic a-fib (HCC)    Chronic diastolic CHF (congestive heart failure) (HCC)    Constipation    COPD (chronic obstructive pulmonary disease) (HCC)    Diabetic ulcer of toe (HCC)    DJD (degenerative joint disease) of  cervical spine    Essential hypertension    GERD (gastroesophageal reflux disease)    History of hiatal hernia    HIT (heparin-induced thrombocytopenia) (HCC)    Hypothyroidism    Hypoxia    a. went home on home O2 04/2017.   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   Retinitis    Sinus bradycardia    a. HR 30s-40s in 04/2017 -> diltiazem stopped, metoprolol reduced.   Sleep apnea    "chose not to order CPAP at this time" (05/18/2017)   Type 2 diabetes mellitus (HCC)    Wears glasses     Past Surgical History:  Procedure Laterality Date   ABDOMINAL AORTOGRAM W/LOWER EXTREMITY N/A 09/15/2017   Procedure: ABDOMINAL AORTOGRAM W/LOWER EXTREMITY;  Surgeon: Nada Libman, MD;  [facility];  Service: Cardiovascular;  Laterality: N/A;   ABDOMINAL AORTOGRAM W/LOWER EXTREMITY N/A 02/04/2020   Procedure: ABDOMINAL AORTOGRAM W/LOWER EXTREMITY;  Surgeon: Nada Libman, MD;  [facility];  Service: Cardiovascular;  Laterality: N/A;   ABDOMINAL AORTOGRAM W/LOWER EXTREMITY N/A 12/15/2020   Procedure: ABDOMINAL AORTOGRAM W/LOWER EXTREMITY;  Surgeon: Nada Libman, MD;  Location: [facility]  warm, well-perfused, pedal pulses not palpable, mild bilateral lower extremity edema  Rectal/GU  Deferred  Neuro:   Grossly non-focal and symmetrical throughout  Skin:   Clean and dry, no rashes, no breakdown  Diagnostic Tests:  ECHOCARDIOGRAM REPORT       Patient Name:   Mike Wright Date of Exam: 07/19/2022  Medical Rec #:  [id number]       Height:       67.0 in  Accession #:    [id number]      Weight:       194.9 lb  Date of Birth:  09/02/1933       BSA:          2.000 m  Patient Age:    89 years        BP:           136/56 mmHg  Patient Gender: M               HR:           69 bpm.  [facility]   Procedure: 2D Echo, Cardiac Doppler and Color Doppler   Indications:    CHF-Acute Diastolic I50.31    History:        Patient has prior history of Echocardiogram examinations,  most                 recent 11/11/2021. CHF, CAD, Arrythmias:Atrial  Fibrillation,                 Bradycardia and Atrial Flutter; Risk Factors:Hypertension,                  Diabetes and Dyslipidemia. Sleep apnea,                  PAD (peripheral artery disease).    Sonographer:    Celesta Gentile RCS  Referring Phys: [id number] OLADAPO ADEFESO   IMPRESSIONS     1. Left ventricular ejection fraction, by estimation, is 70 to 75%. The  left ventricle has hyperdynamic function. The left ventricle has no  regional wall motion abnormalities. There is mild left ventricular  hypertrophy. Left ventricular diastolic  function could not be evaluated due to atrial flutter.   2. Right ventricular systolic function is normal. The right ventricular  size is normal. There is severely elevated pulmonary artery systolic  pressure. The estimated right ventricular systolic pressure is 73.1 mmHg.   3. Left atrial size was severely dilated.   4. Right atrial size was severely dilated.   5. The mitral valve is abnormal. No evidence of mitral valve   regurgitation. No evidence of mitral stenosis. Severe mitral annular  calcification.   6. The tricuspid valve is abnormal. Tricuspid valve regurgitation is mild  to moderate.   7. The aortic valve is calcified. Aortic valve regurgitation is mild.  Moderate to severe aortic valve stenosis. Aortic valve area, by VTI  measures 1.13 cm. Aortic valve mean gradient measures 33.0 mmHg. Aortic  valve Vmax measures 4.03 m/s. AV DVI 0.33.   8. The inferior vena cava is dilated in size with <50% respiratory  variability, suggesting right atrial pressure of 15 mmHg.   9. Increased flow velocities may be secondary to anemia, thyrotoxicosis,  hyperdynamic or high flow state.   Comparison(s): Changes from prior study are noted. AS worsened from  moderate (DVI 0.33, Vmax 3.46 m/sec, mean PG 25 mm Hg) in 10/2021 to  moderate-severe (DVI 0.33, Vmax  warm, well-perfused, pedal pulses not palpable, mild bilateral lower extremity edema  Rectal/GU  Deferred  Neuro:   Grossly non-focal and symmetrical throughout  Skin:   Clean and dry, no rashes, no breakdown  Diagnostic Tests:  ECHOCARDIOGRAM REPORT       Patient Name:   Mike Wright Date of Exam: 07/19/2022  Medical Rec #:  [id number]       Height:       67.0 in  Accession #:    [id number]      Weight:       194.9 lb  Date of Birth:  09/02/1933       BSA:          2.000 m  Patient Age:    89 years        BP:           136/56 mmHg  Patient Gender: M               HR:           69 bpm.  Ex[facility]   Procedure: 2D Echo, Cardiac Doppler and Color Doppler   Indications:    CHF-Acute Diastolic I50.31    History:        Patient has prior history of Echocardiogram examinations,  most                 recent 11/11/2021. CHF, CAD, Arrythmias:Atrial  Fibrillation,                 Bradycardia and Atrial Flutter; Risk Factors:Hypertension,                  Diabetes and Dyslipidemia. Sleep apnea,                  PAD (peripheral artery disease).    Sonographer:    Celesta Gentile RCS  Referring Phys: [id number] OLADAPO ADEFESO   IMPRESSIONS     1. Left ventricular ejection fraction, by estimation, is 70 to 75%. The  left ventricle has hyperdynamic function. The left ventricle has no  regional wall motion abnormalities. There is mild left ventricular  hypertrophy. Left ventricular diastolic  function could not be evaluated due to atrial flutter.   2. Right ventricular systolic function is normal. The right ventricular  size is normal. There is severely elevated pulmonary artery systolic  pressure. The estimated right ventricular systolic pressure is 73.1 mmHg.   3. Left atrial size was severely dilated.   4. Right atrial size was severely dilated.   5. The mitral valve is abnormal. No evidence of mitral valve   regurgitation. No evidence of mitral stenosis. Severe mitral annular  calcification.   6. The tricuspid valve is abnormal. Tricuspid valve regurgitation is mild  to moderate.   7. The aortic valve is calcified. Aortic valve regurgitation is mild.  Moderate to severe aortic valve stenosis. Aortic valve area, by VTI  measures 1.13 cm. Aortic valve mean gradient measures 33.0 mmHg. Aortic  valve Vmax measures 4.03 m/s. AV DVI 0.33.   8. The inferior vena cava is dilated in size with <50% respiratory  variability, suggesting right atrial pressure of 15 mmHg.   9. Increased flow velocities may be secondary to anemia, thyrotoxicosis,  hyperdynamic or high flow state.   Comparison(s): Changes from prior study are noted. AS worsened from  moderate (DVI 0.33, Vmax 3.46 m/sec, mean PG 25 mm Hg) in 10/2021 to  moderate-severe (DVI 0.33, Vmax  Patient ID: Mike Wright, male   DOB: 1933/04/10, 89 y.o.   MRN: [id number]  [facility]       [address]       [name] [address]             [phone]          CARDIOTHORACIC SURGERY CONSULTATION REPORT  PCP is [name], MD Referring Provider is Tonny Bollman, MD Primary Cardiologist is Dina Rich, MD  Reason for consultation: Severe aortic stenosis  HPI:  The patient is an 89 year old gentleman with a history of type 2 diabetes, hypertension, hyperlipidemia, COPD, OSA not on CPAP, stage IIIa chronic kidney disease, chronic anemia, chronic atrial fibrillation, coronary artery disease status post stenting in 2019, history of heparin-induced thrombocytopenia, chronic diastolic congestive heart failure, and severe aortic stenosis who was referred for consideration of TAVR.  His most recent echocardiogram was in April 2024 and showed a calcified aortic valve with a mean gradient of 33 mmHg and a peak gradient of 65 mmHg.  Aortic valve area by VTI was 1.13 cm.  There was mild aortic insufficiency.  Left ventricular ejection fraction was 70 to 75%.  He is here today with his wife.  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  He said that he rarely gets out of the house and uses a walker inside the house.  If he gets out he has to use a wheelchair.  Past Medical History:  Diagnosis Date   Anemia    a. mild, noted 04/2017.   CAD in native artery    a. [place] 04/2017 s/p DES to D1, DES to prox-mid LAD, DES to prox LAD overlapping the prior stent, LVEF 55-65%.    Chronic a-fib (HCC)    Chronic diastolic CHF (congestive heart failure) (HCC)    Constipation    COPD (chronic obstructive pulmonary disease) (HCC)    Diabetic ulcer of toe (HCC)    DJD (degenerative joint disease) of  cervical spine    Essential hypertension    GERD (gastroesophageal reflux disease)    History of hiatal hernia    HIT (heparin-induced thrombocytopenia) (HCC)    Hypothyroidism    Hypoxia    a. went home on home O2 04/2017.   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   Retinitis    Sinus bradycardia    a. HR 30s-40s in 04/2017 -> diltiazem stopped, metoprolol reduced.   Sleep apnea    "chose not to order CPAP at this time" (05/18/2017)   Type 2 diabetes mellitus (HCC)    Wears glasses     Past Surgical History:  Procedure Laterality Date   ABDOMINAL AORTOGRAM W/LOWER EXTREMITY N/A 09/15/2017   Procedure: ABDOMINAL AORTOGRAM W/LOWER EXTREMITY;  Surgeon: Nada Libman, MD;  Location: [facility];  Service: Cardiovascular;  Laterality: N/A;   ABDOMINAL AORTOGRAM W/LOWER EXTREMITY N/A 02/04/2020   Procedure: ABDOMINAL AORTOGRAM W/LOWER EXTREMITY;  Surgeon: Nada Libman, MD;  Location: [facility];  Service: Cardiovascular;  Laterality: N/A;   ABDOMINAL AORTOGRAM W/LOWER EXTREMITY N/A 12/15/2020   Procedure: ABDOMINAL AORTOGRAM W/LOWER EXTREMITY;  Surgeon: Nada Libman, MD;  Location: [facility]  warm, well-perfused, pedal pulses not palpable, mild bilateral lower extremity edema  Rectal/GU  Deferred  Neuro:   Grossly non-focal and symmetrical throughout  Skin:   Clean and dry, no rashes, no breakdown  Diagnostic Tests:  ECHOCARDIOGRAM REPORT       Patient Name:   Mike Wright Date of Exam: 07/19/2022  Medical Rec #:  [id number]       Height:       67.0 in  Accession #:    [id number]      Weight:       194.9 lb  Date of Birth:  09/02/1933       BSA:          2.000 m  Patient Age:    89 years        BP:           136/56 mmHg  Patient Gender: M               HR:           69 bpm.  [facility] Location[facility]   Procedure: 2D Echo, Cardiac Doppler and Color Doppler   Indications:    CHF-Acute Diastolic I50.31    History:        Patient has prior history of Echocardiogram examinations,  most                 recent 11/11/2021. CHF, CAD, Arrythmias:Atrial  Fibrillation,                 Bradycardia and Atrial Flutter; Risk Factors:Hypertension,                  Diabetes and Dyslipidemia. Sleep apnea,                  PAD (peripheral artery disease).    Sonographer:    Celesta Gentile RCS  Referring Phys: [id number] OLADAPO ADEFESO   IMPRESSIONS     1. Left ventricular ejection fraction, by estimation, is 70 to 75%. The  left ventricle has hyperdynamic function. The left ventricle has no  regional wall motion abnormalities. There is mild left ventricular  hypertrophy. Left ventricular diastolic  function could not be evaluated due to atrial flutter.   2. Right ventricular systolic function is normal. The right ventricular  size is normal. There is severely elevated pulmonary artery systolic  pressure. The estimated right ventricular systolic pressure is 73.1 mmHg.   3. Left atrial size was severely dilated.   4. Right atrial size was severely dilated.   5. The mitral valve is abnormal. No evidence of mitral valve   regurgitation. No evidence of mitral stenosis. Severe mitral annular  calcification.   6. The tricuspid valve is abnormal. Tricuspid valve regurgitation is mild  to moderate.   7. The aortic valve is calcified. Aortic valve regurgitation is mild.  Moderate to severe aortic valve stenosis. Aortic valve area, by VTI  measures 1.13 cm. Aortic valve mean gradient measures 33.0 mmHg. Aortic  valve Vmax measures 4.03 m/s. AV DVI 0.33.   8. The inferior vena cava is dilated in size with <50% respiratory  variability, suggesting right atrial pressure of 15 mmHg.   9. Increased flow velocities may be secondary to anemia, thyrotoxicosis,  hyperdynamic or high flow state.   Comparison(s): Changes from prior study are noted. AS worsened from  moderate (DVI 0.33, Vmax 3.46 m/sec, mean PG 25 mm Hg) in 10/2021 to  moderate-severe (DVI 0.33, Vmax  warm, well-perfused, pedal pulses not palpable, mild bilateral lower extremity edema  Rectal/GU  Deferred  Neuro:   Grossly non-focal and symmetrical throughout  Skin:   Clean and dry, no rashes, no breakdown  Diagnostic Tests:  ECHOCARDIOGRAM REPORT       Patient Name:   Mike Wright Date of Exam: 07/19/2022  Medical Rec #:  [id number]       Height:       67.0 in  Accession #:    [id number]      Weight:       194.9 lb  Date of Birth:  09/02/1933       BSA:          2.000 m  Patient Age:    89 years        BP:           136/56 mmHg  Patient Gender: M               HR:           69 bpm.  [facility] Location[facility]   Procedure: 2D Echo, Cardiac Doppler and Color Doppler   Indications:    CHF-Acute Diastolic I50.31    History:        Patient has prior history of Echocardiogram examinations,  most                 recent 11/11/2021. CHF, CAD, Arrythmias:Atrial  Fibrillation,                 Bradycardia and Atrial Flutter; Risk Factors:Hypertension,                  Diabetes and Dyslipidemia. Sleep apnea,                  PAD (peripheral artery disease).    Sonographer:    Celesta Gentile RCS  Referring Phys: [id number] OLADAPO ADEFESO   IMPRESSIONS     1. Left ventricular ejection fraction, by estimation, is 70 to 75%. The  left ventricle has hyperdynamic function. The left ventricle has no  regional wall motion abnormalities. There is mild left ventricular  hypertrophy. Left ventricular diastolic  function could not be evaluated due to atrial flutter.   2. Right ventricular systolic function is normal. The right ventricular  size is normal. There is severely elevated pulmonary artery systolic  pressure. The estimated right ventricular systolic pressure is 73.1 mmHg.   3. Left atrial size was severely dilated.   4. Right atrial size was severely dilated.   5. The mitral valve is abnormal. No evidence of mitral valve   regurgitation. No evidence of mitral stenosis. Severe mitral annular  calcification.   6. The tricuspid valve is abnormal. Tricuspid valve regurgitation is mild  to moderate.   7. The aortic valve is calcified. Aortic valve regurgitation is mild.  Moderate to severe aortic valve stenosis. Aortic valve area, by VTI  measures 1.13 cm. Aortic valve mean gradient measures 33.0 mmHg. Aortic  valve Vmax measures 4.03 m/s. AV DVI 0.33.   8. The inferior vena cava is dilated in size with <50% respiratory  variability, suggesting right atrial pressure of 15 mmHg.   9. Increased flow velocities may be secondary to anemia, thyrotoxicosis,  hyperdynamic or high flow state.   Comparison(s): Changes from prior study are noted. AS worsened from  moderate (DVI 0.33, Vmax 3.46 m/sec, mean PG 25 mm Hg) in 10/2021 to  moderate-severe (DVI 0.33, Vmax  warm, well-perfused, pedal pulses not palpable, mild bilateral lower extremity edema  Rectal/GU  Deferred  Neuro:   Grossly non-focal and symmetrical throughout  Skin:   Clean and dry, no rashes, no breakdown  Diagnostic Tests:  ECHOCARDIOGRAM REPORT       Patient Name:   Mike Wright Date of Exam: 07/19/2022  Medical Rec #:  [id number]       Height:       67.0 in  Accession #:    [id number]      Weight:       194.9 lb  Date of Birth:  09/02/1933       BSA:          2.000 m  Patient Age:    89 years        BP:           136/56 mmHg  Patient Gender: M               HR:           69 bpm.  [facility]   Procedure: 2D Echo, Cardiac Doppler and Color Doppler   Indications:    CHF-Acute Diastolic I50.31    History:        Patient has prior history of Echocardiogram examinations,  most                 recent 11/11/2021. CHF, CAD, Arrythmias:Atrial  Fibrillation,                 Bradycardia and Atrial Flutter; Risk Factors:Hypertension,                  Diabetes and Dyslipidemia. Sleep apnea,                  PAD (peripheral artery disease).    Sonographer:    Celesta Gentile RCS  Referring Phys: [id number] OLADAPO ADEFESO   IMPRESSIONS     1. Left ventricular ejection fraction, by estimation, is 70 to 75%. The  left ventricle has hyperdynamic function. The left ventricle has no  regional wall motion abnormalities. There is mild left ventricular  hypertrophy. Left ventricular diastolic  function could not be evaluated due to atrial flutter.   2. Right ventricular systolic function is normal. The right ventricular  size is normal. There is severely elevated pulmonary artery systolic  pressure. The estimated right ventricular systolic pressure is 73.1 mmHg.   3. Left atrial size was severely dilated.   4. Right atrial size was severely dilated.   5. The mitral valve is abnormal. No evidence of mitral valve   regurgitation. No evidence of mitral stenosis. Severe mitral annular  calcification.   6. The tricuspid valve is abnormal. Tricuspid valve regurgitation is mild  to moderate.   7. The aortic valve is calcified. Aortic valve regurgitation is mild.  Moderate to severe aortic valve stenosis. Aortic valve area, by VTI  measures 1.13 cm. Aortic valve mean gradient measures 33.0 mmHg. Aortic  valve Vmax measures 4.03 m/s. AV DVI 0.33.   8. The inferior vena cava is dilated in size with <50% respiratory  variability, suggesting right atrial pressure of 15 mmHg.   9. Increased flow velocities may be secondary to anemia, thyrotoxicosis,  hyperdynamic or high flow state.   Comparison(s): Changes from prior study are noted. AS worsened from  moderate (DVI 0.33, Vmax 3.46 m/sec, mean PG 25 mm Hg) in 10/2021 to  moderate-severe (DVI 0.33, Vmax  Patient ID: Mike Wright, male   DOB: 1933/04/10, 89 y.o.   MRN: [id number]  [facility]       [address]       [name] [address]             [phone]          CARDIOTHORACIC SURGERY CONSULTATION REPORT  PCP is [name], MD Referring Provider is Tonny Bollman, MD Primary Cardiologist is Dina Rich, MD  Reason for consultation: Severe aortic stenosis  HPI:  The patient is an 89 year old gentleman with a history of type 2 diabetes, hypertension, hyperlipidemia, COPD, OSA not on CPAP, stage IIIa chronic kidney disease, chronic anemia, chronic atrial fibrillation, coronary artery disease status post stenting in 2019, history of heparin-induced thrombocytopenia, chronic diastolic congestive heart failure, and severe aortic stenosis who was referred for consideration of TAVR.  His most recent echocardiogram was in April 2024 and showed a calcified aortic valve with a mean gradient of 33 mmHg and a peak gradient of 65 mmHg.  Aortic valve area by VTI was 1.13 cm.  There was mild aortic insufficiency.  Left ventricular ejection fraction was 70 to 75%.  He is here today with his wife.  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  He said that he rarely gets out of the house and uses a walker inside the house.  If he gets out he has to use a wheelchair.  Past Medical History:  Diagnosis Date   Anemia    a. mild, noted 04/2017.   CAD in native artery    a. [place] 04/2017 s/p DES to D1, DES to prox-mid LAD, DES to prox LAD overlapping the prior stent, LVEF 55-65%.    Chronic a-fib (HCC)    Chronic diastolic CHF (congestive heart failure) (HCC)    Constipation    COPD (chronic obstructive pulmonary disease) (HCC)    Diabetic ulcer of toe (HCC)    DJD (degenerative joint disease) of  cervical spine    Essential hypertension    GERD (gastroesophageal reflux disease)    History of hiatal hernia    HIT (heparin-induced thrombocytopenia) (HCC)    Hypothyroidism    Hypoxia    a. went home on home O2 04/2017.   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   Retinitis    Sinus bradycardia    a. HR 30s-40s in 04/2017 -> diltiazem stopped, metoprolol reduced.   Sleep apnea    "chose not to order CPAP at this time" (05/18/2017)   Type 2 diabetes mellitus (HCC)    Wears glasses     Past Surgical History:  Procedure Laterality Date   ABDOMINAL AORTOGRAM W/LOWER EXTREMITY N/A 09/15/2017   Procedure: ABDOMINAL AORTOGRAM W/LOWER EXTREMITY;  Surgeon: Nada Libman, MD;  Location: [facility];  Service: Cardiovascular;  Laterality: N/A;   ABDOMINAL AORTOGRAM W/LOWER EXTREMITY N/A 02/04/2020   Procedure: ABDOMINAL AORTOGRAM W/LOWER EXTREMITY;  Surgeon: Nada Libman, MD;  Location: [facility];  Service: Cardiovascular;  Laterality: N/A;   ABDOMINAL AORTOGRAM W/LOWER EXTREMITY N/A 12/15/2020   Procedure: ABDOMINAL AORTOGRAM W/LOWER EXTREMITY;  Surgeon: Nada Libman, MD;  Location: [facility]

## 2023-01-15 ENCOUNTER — Other Ambulatory Visit: Payer: Self-pay | Admitting: Cardiology

## 2023-01-16 ENCOUNTER — Ambulatory Visit: Payer: Medicare HMO | Admitting: Physician Assistant

## 2023-01-16 ENCOUNTER — Ambulatory Visit (INDEPENDENT_AMBULATORY_CARE_PROVIDER_SITE_OTHER)
Admission: RE | Admit: 2023-01-16 | Discharge: 2023-01-16 | Disposition: A | Discharge status: Home / Self Care | Payer: Medicare HMO | Source: Ambulatory Visit | Attending: Surgery | Admitting: Surgery

## 2023-01-16 ENCOUNTER — Ambulatory Visit (HOSPITAL_COMMUNITY)
Admission: RE | Admit: 2023-01-16 | Discharge: 2023-01-16 | Disposition: A | Discharge status: Home / Self Care | Payer: Medicare HMO | Source: Ambulatory Visit | Attending: Surgery | Admitting: Surgery

## 2023-01-16 VITALS — BP 136/67 | HR 104 | Temp 97.2°F | Resp 20 | Ht 67.0 in | Wt 169.5 lb

## 2023-01-16 DIAGNOSIS — I739 Peripheral vascular disease, unspecified: Secondary | ICD-10-CM

## 2023-01-16 DIAGNOSIS — I7025 Atherosclerosis of native arteries of other extremities with ulceration: Secondary | ICD-10-CM | POA: Insufficient documentation

## 2023-01-16 LAB — VAS US ABI WITH/WO TBI
Left ABI: 1.17
Right ABI: 1.12

## 2023-01-16 NOTE — Progress Notes (Signed)
Office Note     CC:  follow up Requesting Provider:  [name], MD  HPI: Mike Wright is a 89 y.o. (04-May-1933) male who presents for routine follow up of PAD. He has undergone multiple vascular interventions as listed below:   Vascular intervention hx: -angioplasty of left PTA and peroneal arteries on 09/15/2017 Dr. Myra Gianotti -angioplasty right PTA 09/20/2017 Dr. Myra Gianotti -right TMA 02/05/2020 Dr. Myra Gianotti -stent right SFA 12/15/2020 Dr. Myra Gianotti -left TMA 11/12/2021 Dr. Randie Heinz  Pt was last seen in February and at that time his left TMA amputation had healed fully. He His balance was a little unsteady and had some falls. He had a right shoe insert for his TMA but not yet received his left. He was still having some significant phantom pains in hs left great toe but no rest pain or claudication   The pt returns today for follow up and here with his wife.  He overall says he is doing well from the standpoint of his legs. He states he still has some phantom pain in the left foot but it has improved. He now has inserts in both of his shoes and a brace to help him ambulate. He does use rolling walker. He remains challenged with his balance after his TMA's and also with his neuropathy. He continues to still have frequent falls. He does not have any claudication, rest pain or non healing wounds. He is medically managed on statin, plavix.  He is also compliant with his Eliquis for his chronic a fib  He has been having recent shortness of breath and fatigue. He recently underwent right heart cath and scheduled to have a TAVR with moderate to severe Aortic Stenosis on November 12th with Dr. Laneta Simmers Dr. Excell Seltzer.   The pt is on a statin for cholesterol management.    The pt is not on an aspirin.    Other AC:  Plavix, Eliquis The pt is on diuretic for hypertension.  The pt does  have diabetes. Tobacco hx:  former  Past Medical History:  Diagnosis Date   Anemia    a. mild, noted 04/2017.   CAD in native  artery    a. [place] 04/2017 s/p DES to D1, DES to prox-mid LAD, DES to prox LAD overlapping the prior stent, LVEF 55-65%.    Chronic a-fib (HCC)    Chronic diastolic CHF (congestive heart failure) (HCC)    Constipation    COPD (chronic obstructive pulmonary disease) (HCC)    Diabetic ulcer of toe (HCC)    DJD (degenerative joint disease) of cervical spine    Essential hypertension    GERD (gastroesophageal reflux disease)    History of hiatal hernia    HIT (heparin-induced thrombocytopenia) (HCC)    Hypothyroidism    Hypoxia    a. went home on home O2 04/2017.   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   Retinitis    Sinus bradycardia    a. HR 30s-40s in 04/2017 -> diltiazem stopped, metoprolol reduced.   Sleep apnea    "chose not to order CPAP at this time" (05/18/2017)   Type 2 diabetes mellitus (HCC)    Wears glasses     Past Surgical History:  Procedure Laterality Date   ABDOMINAL AORTOGRAM W/LOWER EXTREMITY N/A 09/15/2017   Procedure: ABDOMINAL AORTOGRAM W/LOWER EXTREMITY;  Surgeon: Nada Libman, MD;  Location: [facility];  Service: Cardiovascular;  Laterality: N/A;   ABDOMINAL AORTOGRAM W/LOWER EXTREMITY N/A 02/04/2020   Procedure: ABDOMINAL AORTOGRAM W/LOWER EXTREMITY;  Surgeon: Nada Libman, MD;  Location: [facility];  Service: Cardiovascular;  Laterality: N/A;   ABDOMINAL AORTOGRAM W/LOWER EXTREMITY N/A 12/15/2020   Procedure: ABDOMINAL AORTOGRAM W/LOWER EXTREMITY;  Surgeon: Nada Libman, MD;  Location: [facility];  Service: Cardiovascular;  Laterality: N/A;   AMPUTATION Bilateral 09/20/2017   Procedure: BILATERAL GREAT TOE AMPUTATIONS INCLUDING METATARSAL HEADS;  Surgeon: Nada Libman, MD;  Location: [facility] OR;  Service: Vascular;  Laterality: Bilateral;   AMPUTATION Right 03/01/2019   Procedure: AMPUTATION TOES;  Surgeon: Chuck Hint, MD;  Location: [facility] OR;  Service: Vascular;  Laterality: Right;   AMPUTATION Right 05/29/2019   Procedure: AMPUTATION RIGHT THIRD TOE;  Surgeon: Nada Libman, MD;  Location: [facility] OR;  Service: Vascular;  Laterality: Right;   AMPUTATION Right 09/26/2019   Procedure: AMPUTATION RIGHT FOURTH TOE AND FIFTH TOES;  Surgeon: Nada Libman, MD;  Location: [facility] OR;  Service: Vascular;  Laterality: Right;   ANGIOPLASTY Right 09/20/2017   Procedure: ANGIOPLASTY RIGHT TIBIAL ARTERY;  Surgeon: Nada Libman, MD;  Location: [facility] OR;  Service: Vascular;  Laterality: Right;   APPENDECTOMY     APPLICATION OF WOUND VAC  12/17/2020   Procedure: APPLICATION OF WOUND VAC;  Surgeon: Nada Libman, MD;  Location: [facility] OR;  Service: Vascular;;   BACK SURGERY     CARDIAC CATHETERIZATION  1980s; 2012;   CATARACT EXTRACTION Left 2004   COLONOSCOPY  2004   FLEISHMAN TICS   COLONOSCOPY WITH PROPOFOL N/A 11/28/2018   Procedure: COLONOSCOPY WITH PROPOFOL;  Surgeon: Malissa Hippo, MD;  Location: [facility];  Service: Endoscopy;  Laterality: N/A;   CORONARY ANGIOPLASTY WITH STENT PLACEMENT  05/18/2017   "3 stents"   CORONARY STENT INTERVENTION N/A 05/18/2017   Procedure: CORONARY STENT INTERVENTION;  Surgeon: Corky Crafts, MD;  Location: [facility] INVASIVE CV LAB;  Service: Cardiovascular;  Laterality: N/A;   ESOPHAGOGASTRODUODENOSCOPY (EGD) WITH PROPOFOL N/A 11/20/2017   Procedure: ESOPHAGOGASTRODUODENOSCOPY (EGD) WITH PROPOFOL;  Surgeon: Hilarie Fredrickson, MD;  Location: [facility] ENDOSCOPY;  Service: Gastroenterology;  Laterality: N/A;   ESOPHAGOGASTRODUODENOSCOPY (EGD) WITH PROPOFOL N/A 11/28/2018   Procedure: ESOPHAGOGASTRODUODENOSCOPY (EGD) WITH PROPOFOL;  Surgeon: Malissa Hippo, MD;  [facility];  Service: Endoscopy;  Laterality: N/A;   ESOPHAGOGASTRODUODENOSCOPY (EGD) WITH PROPOFOL N/A 07/12/2019   Procedure: ESOPHAGOGASTRODUODENOSCOPY (EGD) WITH PROPOFOL;  Surgeon: Malissa Hippo, MD;  [facility];  Service: Endoscopy;  Laterality: N/A;  125   GIVENS CAPSULE STUDY N/A 12/17/2018   Procedure: GIVENS CAPSULE STUDY;  Surgeon: Malissa Hippo, MD;  [facility];  Service: Endoscopy;  Laterality: N/A;  7:30am   I & D EXTREMITY Right 12/17/2017   Procedure: IRRIGATION AND DEBRIDEMENT RIGHT GREAT TOE;  Surgeon: Nada Libman, MD;  Location: [facility];  Service: Vascular;  Laterality: Right;   INCISION AND DRAINAGE Right 12/17/2020   Procedure: INCISION AND DRAINAGE OF RIGHT FOOT;  Surgeon: Nada Libman, MD;  Location: [facility];  Service: Vascular;  Laterality: Right;   JOINT REPLACEMENT     LEFT HEART CATH AND CORONARY ANGIOGRAPHY N/A 05/18/2017   Procedure: LEFT HEART CATH AND CORONARY ANGIOGRAPHY;  Surgeon: Corky Crafts, MD;  Location: [facility] INVASIVE CV LAB;  Service: Cardiovascular;  Laterality: N/A;   LOWER EXTREMITY ANGIOGRAM  Right 09/20/2017   Procedure: RIGHT LOWER LEG  ANGIOGRAM;  Surgeon: Nada Libman, MD;  Location: [facility];  Service: Vascular;  Laterality: Right;   LUMBAR FUSION  2002   L3, 4 L4, 5 L5 S1 Fused by Dr. Trey Sailors   PERIPHERAL VASCULAR BALLOON ANGIOPLASTY Left 09/15/2017   Procedure: PERIPHERAL VASCULAR BALLOON ANGIOPLASTY;  Surgeon: Nada Libman, MD;  [facility];  Service: Cardiovascular;  Laterality: Left;  PTA of Peroneal & Posterior Tibial   PERIPHERAL VASCULAR INTERVENTION Right 12/15/2020   Procedure: PERIPHERAL VASCULAR INTERVENTION;  Surgeon: Nada Libman, MD;  [facility]: [facility];  Service: Cardiovascular;  Laterality: Right;  SFA   POLYPECTOMY  11/28/2018   Procedure: POLYPECTOMY;  Surgeon: Malissa Hippo, MD;  [facility];  Service: Endoscopy;;  duodenum   POSTERIOR LUMBAR FUSION     RHINOPLASTY     RIGHT HEART CATH N/A 05/18/2017   Procedure: RIGHT HEART CATH;  Surgeon: Corky Crafts, MD;  Location: [facility] INVASIVE CV LAB;  Service: Cardiovascular;  Laterality: N/A;   RIGHT/LEFT HEART CATH  AND CORONARY ANGIOGRAPHY N/A 12/16/2022   Procedure: RIGHT/LEFT HEART CATH AND CORONARY ANGIOGRAPHY;  Surgeon: Tonny Bollman, MD;  Location: [facility] INVASIVE CV LAB;  Service: Cardiovascular;  Laterality: N/A;   TEE WITHOUT CARDIOVERSION N/A 09/18/2017   Procedure: TRANSESOPHAGEAL ECHOCARDIOGRAM (TEE);  Surgeon: Pricilla Riffle, MD;  Location: [facility] ENDOSCOPY;  Service: Cardiovascular;  Laterality: N/A;   TOTAL KNEE ARTHROPLASTY Bilateral    TRANSMETATARSAL AMPUTATION Right 02/04/2020   Procedure: RIGHT TRANSMETATARSAL AMPUTATION;  Surgeon: Nada Libman, MD;  Location: [facility] OR;  Service: Vascular;  Laterality: Right;   TRANSMETATARSAL AMPUTATION Left 11/12/2021   Procedure: LEFT TRANSMETATARSAL AMPUTATION;  Surgeon: Maeola Harman, MD;  Location: [facility] OR;  Service: Vascular;  Laterality: Left;   TRANSURETHRAL RESECTION OF PROSTATE  2001   Rito Ehrlich    Social History   Socioeconomic History   Marital status: Married    Spouse name: patsy   Number of children: Not on file   Years of education: Not on file   Highest education level: Not on file  Occupational History   Not on file  Tobacco Use   Smoking status: Former    Current packs/day: 0.00    Average packs/day: 2.0 packs/day for 30.0 years (60.0 ttl pk-yrs)    Types: Cigarettes    Start date: 03/28/1953    Quit date: 03/29/1983    Years since quitting: 39.8   Smokeless tobacco: Never  Vaping Use   Vaping status: Never Used  Substance and Sexual Activity   Alcohol use: Not Currently    Comment: 05/18/2017 'I drink a beer q yr"   Drug use: No   Sexual activity: Not Currently  Other Topics Concern   Not on file  Social History Narrative   Patient lives at home with his spouse.    Social Determinants of Health   Financial Resource Strain: Low Risk  (12/15/2022)   Overall Financial Resource Strain (CARDIA)    Difficulty of Paying Living Expenses: Not hard at all  Food Insecurity: No Food Insecurity (08/29/2022)   Hunger Vital Sign     Worried About Running Out of Food in the Last Year: Never true    Ran Out of Food in the Last Year: Never true  Transportation Needs: No Transportation Needs (01/06/2023)   PRAPARE - Administrator, Civil Service (Medical): No    Lack of Transportation (  Non-Medical): No  Physical Activity: Inactive (08/03/2022)   Exercise Vital Sign    Days of Exercise per Week: 0 days    Minutes of Exercise per Session: 0 min  Stress: No Stress Concern Present (07/29/2019)   [employer] of Occupational Health - Occupational Stress Questionnaire    Feeling of Stress : Not at all  Social Connections: Not on file  Intimate Partner Violence: Not At Risk (08/29/2022)   Humiliation, Afraid, Rape, and Kick questionnaire    Fear of Current or Ex-Partner: No    Emotionally Abused: No    Physically Abused: No    Sexually Abused: No    Family History  Problem Relation Age of Onset   Heart attack Father 34   COPD Father    COPD Mother    Heart disease Mother    Diabetes Mother    Hypertension Sister    CVA Sister    Diabetes Sister    Multiple sclerosis Sister     Current Outpatient Medications  Medication Sig Dispense Refill   Accu-Chek Softclix Lancets lancets      acetaminophen (TYLENOL) 500 MG tablet Take 1 tablet (500 mg total) by mouth every 6 (six) hours as needed for mild pain or headache. (Patient taking differently: Take 1,000 mg by mouth every 8 (eight) hours as needed for mild pain (pain score 1-3), moderate pain (pain score 4-6) or headache.) 30 tablet 0   Alcohol Swabs (B-D SINGLE USE SWABS REGULAR) PADS      amLODipine (NORVASC) 2.5 MG tablet TAKE 1 TABLET EVERY DAY 90 tablet 3   apixaban (ELIQUIS) 2.5 MG TABS tablet Take 2.5 mg by mouth 2 (two) times daily.     atorvastatin (LIPITOR) 80 MG tablet Take 80 mg by mouth every evening.      Blood Glucose Monitoring Suppl (ACCU-CHEK GUIDE) w/Device KIT      Cholecalciferol (VITAMIN D3) 50 MCG (2000 UT) TABS Take 2,000 Units  by mouth 2 (two) times daily.     clopidogrel (PLAVIX) 75 MG tablet Take 1 tablet (75 mg total) by mouth daily with breakfast. 30 tablet 2   Cyanocobalamin (B-12) 1000 MCG TABS Take 2,000 mcg by mouth daily.     diazepam (VALIUM) 2 MG tablet Take 2 mg by mouth daily as needed for anxiety.     DROPLET INSULIN SYRINGE 31G X 5/16" 1 ML MISC      folic acid (FOLVITE) 1 MG tablet Take 1 tablet (1 mg total) by mouth daily. (Patient taking differently: Take 800 mcg by mouth daily.) 30 tablet 1   insulin glargine (LANTUS) 100 UNIT/ML injection Inject 30 Units into the skin 2 (two) times daily.     ipratropium-albuterol (DUONEB) 0.5-2.5 (3) MG/3ML SOLN Take 3 mLs by nebulization every 6 (six) hours as needed (Shortness of breath).     isosorbide mononitrate (IMDUR) 60 MG 24 hr tablet TAKE 1 TABLET TWICE DAILY (DOSE INCREASE 10/26/21) 180 tablet 1   nitroGLYCERIN (NITROSTAT) 0.4 MG SL tablet PLACE 1 TABLET (0.4 MG TOTAL) UNDER THE TONGUE EVERY 5 (FIVE) MINUTES AS NEEDED FOR CHEST PAIN. 25 tablet 11   OXYGEN Inhale 2 L/min into the lungs See admin instructions. 2 L/min of oxygen at bedtime and during the day as needed for shortness of breath     pantoprazole (PROTONIX) 40 MG tablet Take 40 mg by mouth 2 (two) times daily.     potassium chloride SA (KLOR-CON M) 20 MEQ tablet Take 1 tablet (20 mEq total) by  mouth 2 (two) times daily. 30 tablet 1   psyllium (REGULOID) 0.52 g capsule Take 0.52 g by mouth 2 (two) times daily.     SPIRIVA HANDIHALER 18 MCG inhalation capsule Place 1 capsule into inhaler and inhale daily after breakfast.      torsemide (DEMADEX) 20 MG tablet TAKE 1 TABLET TWICE DAILY 180 tablet 3   traZODone (DESYREL) 50 MG tablet Take 100 mg by mouth at bedtime.     TRUE METRIX BLOOD GLUCOSE TEST test strip      No current facility-administered medications for this visit.    Allergies  Allergen Reactions   Codeine Shortness Of Breath   Doxycycline Swelling    Swelling and numbness in lips and  face. Swelling improved after stopping. Reports still experiences numbness in bottom lip.    Feraheme [Ferumoxytol] Other (See Comments)    Diaphoretic, chest pain   Heparin Other (See Comments)    +HIT,  Severe bleeding (with heparin drip & large doses), tolerates low doses   Iron Shortness Of Breath    Patient severe reaction to IV iron and does not tolerate oral formulations either   Losartan Swelling   Oxycodone Other (See Comments)    "Made me act out of my mind" Mental status changes- hallucinations   Trelegy Ellipta [Fluticasone-Umeclidin-Vilant]     "Blood clots in throat"   Latex Rash     REVIEW OF SYSTEMS:  [X]  denotes positive finding, [ ]  denotes negative finding Cardiac  Comments:  Chest pain or chest pressure:    Shortness of breath upon exertion:    Short of breath when lying flat:    Irregular heart rhythm:        Vascular    Pain in calf, thigh, or hip brought on by ambulation:    Pain in feet at night that wakes you up from your sleep:     Blood clot in your veins:    Leg swelling:         Pulmonary    Oxygen at home:    Productive cough:     Wheezing:         Neurologic    Sudden weakness in arms or legs:     Sudden numbness in arms or legs:     Sudden onset of difficulty speaking or slurred speech:    Temporary loss of vision in one eye:     Problems with dizziness:         Gastrointestinal    Blood in stool:     Vomited blood:         Genitourinary    Burning when urinating:     Blood in urine:        Psychiatric    Major depression:         Hematologic    Bleeding problems:    Problems with blood clotting too easily:        Skin    Rashes or ulcers:        Constitutional    Fever or chills:      PHYSICAL EXAMINATION:  Vitals:   01/16/23 1349  BP: 136/67  Pulse: (!) 104  Resp: 20  Temp: (!) 97.2 F (36.2 C)  SpO2: 96%  Weight: 169 lb 8 oz (76.9 kg)  Height: 5\' 7"  (1.702 m)    General:  WDWN in NAD; vital signs  documented above Gait: Ambulates with rolling walker HENT: WNL, normocephalic Pulmonary: normal non-labored breathing , without  wheezing Cardiac: regular HR, AS mumur Abdomen: soft, NT, no masses Vascular Exam/Pulses: 2+ femoral pulses,  Extremities: without ischemic changes, without Gangrene , without cellulitis; without open wounds; bilateral TMA's well healed Musculoskeletal: no muscle wasting or atrophy  Neurologic: A&O X 3 Psychiatric:  The pt has Normal affect.   Non-Invasive Vascular Imaging:   +-------+-----------+-----------+------------+------------+  ABI/TBIToday's ABIToday's TBIPrevious ABIPrevious TBI  +-------+-----------+-----------+------------+------------+  Right 1.12       -          1.07        -             +-------+-----------+-----------+------------+------------+  Left  1.17       -          1.01                      +-------+-----------+-----------+------------+------------+   +-----------+--------+-----+--------+----------+--------+  RIGHT     PSV cm/sRatioStenosisWaveform  Comments  +-----------+--------+-----+--------+----------+--------+  CFA Distal 90                   biphasic            +-----------+--------+-----+--------+----------+--------+  DFA       107                  biphasic            +-----------+--------+-----+--------+----------+--------+  SFA Prox   66                   biphasic            +-----------+--------+-----+--------+----------+--------+  SFA Mid    120                  biphasic            +-----------+--------+-----+--------+----------+--------+  SFA Distal 137                  biphasic            +-----------+--------+-----+--------+----------+--------+  POP Prox   54                   biphasic            +-----------+--------+-----+--------+----------+--------+  POP Distal 38                   biphasic             +-----------+--------+-----+--------+----------+--------+  ATA Distal 9                    monophasicscant     +-----------+--------+-----+--------+----------+--------+  PTA Distal 73                   biphasic            +-----------+--------+-----+--------+----------+--------+  PERO Distal45                   biphasic            +-----------+--------+-----+--------+----------+--------+    Right Stent(s):  +------------------------+--------+--------+--------+--------+  Right prox-mid SFA stentPSV cm/sStenosisWaveformComments  +------------------------+--------+--------+--------+--------+  Prox to Stent           66              biphasic          +------------------------+--------+--------+--------+--------+  Proximal Stent          77              biphasic          +------------------------+--------+--------+--------+--------+  Mid Stent               82              biphasic          +------------------------+--------+--------+--------+--------+  Distal Stent            70              biphasic          +------------------------+--------+--------+--------+--------+  Distal to Stent         120             biphasic          +------------------------+--------+--------+--------+--------+    +-----------+--------+-----+--------+----------+---------+  LEFT      PSV cm/sRatioStenosisWaveform  Comments   +-----------+--------+-----+--------+----------+---------+  CFA Distal 149                  biphasic             +-----------+--------+-----+--------+----------+---------+  DFA       88                   biphasic  turbulent  +-----------+--------+-----+--------+----------+---------+  SFA Prox   139                  biphasic             +-----------+--------+-----+--------+----------+---------+  SFA Mid    105                  biphasic              +-----------+--------+-----+--------+----------+---------+  SFA Distal 144                  biphasic             +-----------+--------+-----+--------+----------+---------+  POP Prox   65                   biphasic             +-----------+--------+-----+--------+----------+---------+  POP Distal 57                   biphasic             +-----------+--------+-----+--------+----------+---------+  ATA Distal 24                   monophasic           +-----------+--------+-----+--------+----------+---------+  PTA Distal 93                   monophasic           +-----------+--------+-----+--------+----------+---------+  PERO Distal34                   biphasic             +-----------+--------+-----+--------+----------+---------+   Summary:  Right: Patent stent with no evidence of stenosis in the superficial femoral artery artery.   Left: No evidence of focal stenosis detected throughout the arteries of the left lower extremity.    ASSESSMENT/PLAN:: 89 y.o. male here for routine follow up of PAD. He has undergone multiple vascular interventions as listed below:   Vascular intervention hx: -angioplasty of left PTA and peroneal arteries on 09/15/2017 Dr. Myra Gianotti -angioplasty right PTA 09/20/2017 Dr. Myra Gianotti -right TMA 02/05/2020 Dr. Myra Gianotti -stent right SFA 12/15/2020 Dr. Myra Gianotti -left TMA 11/12/2021 Dr. Randie Heinz -His TMAs are both healed. He is without claudication, rest pain or tissue loss -  His ABIs today are stable  - Duplex shows patent runoff of bilateral lower extremities with patent stents - Encourage continued mobilization - He will follow up in 6 months with repeat BLE arterial duplex and ABIs   Graceann Congress, PA-C [facility] [phone]  Clinic MD:   Myra Gianotti

## 2023-01-17 ENCOUNTER — Ambulatory Visit: Payer: Medicare HMO | Admitting: Nurse Practitioner

## 2023-01-18 ENCOUNTER — Ambulatory Visit: Payer: Medicare HMO | Attending: Nurse Practitioner | Admitting: Nurse Practitioner

## 2023-01-18 ENCOUNTER — Encounter: Payer: Self-pay | Admitting: Nurse Practitioner

## 2023-01-18 VITALS — BP 154/81 | HR 57 | Ht 67.0 in | Wt 170.8 lb

## 2023-01-18 DIAGNOSIS — E785 Hyperlipidemia, unspecified: Secondary | ICD-10-CM

## 2023-01-18 DIAGNOSIS — I2510 Atherosclerotic heart disease of native coronary artery without angina pectoris: Secondary | ICD-10-CM | POA: Diagnosis not present

## 2023-01-18 DIAGNOSIS — I10 Essential (primary) hypertension: Secondary | ICD-10-CM | POA: Diagnosis not present

## 2023-01-18 DIAGNOSIS — I739 Peripheral vascular disease, unspecified: Secondary | ICD-10-CM

## 2023-01-18 DIAGNOSIS — I4891 Unspecified atrial fibrillation: Secondary | ICD-10-CM | POA: Diagnosis not present

## 2023-01-18 DIAGNOSIS — I5032 Chronic diastolic (congestive) heart failure: Secondary | ICD-10-CM

## 2023-01-18 DIAGNOSIS — N1831 Chronic kidney disease, stage 3a: Secondary | ICD-10-CM

## 2023-01-18 DIAGNOSIS — I350 Nonrheumatic aortic (valve) stenosis: Secondary | ICD-10-CM

## 2023-01-18 DIAGNOSIS — R001 Bradycardia, unspecified: Secondary | ICD-10-CM

## 2023-01-18 DIAGNOSIS — R5383 Other fatigue: Secondary | ICD-10-CM | POA: Diagnosis not present

## 2023-01-18 NOTE — Patient Instructions (Addendum)
Medication Instructions:  Your physician recommends that you continue on your current medications as directed. Please refer to the Current Medication list given to you today.  Labwork: None   Testing/Procedures: None   Follow-Up: Your physician recommends that you schedule a follow-up appointment in: 6-8 weeks   Any Other Special Instructions Will Be Listed Below (If Applicable).  If you need a refill on your cardiac medications before your next appointment, please call your pharmacy.

## 2023-01-18 NOTE — Progress Notes (Unsigned)
Office Visit    Patient Name: Mike Wright Date of Encounter: 10/17/2022 PCP:  [name], MD [facility]  Cardiologist:  Dina Rich, MD  Advanced Practice Provider:  No care team member to display Electrophysiologist:  None   Chief Complaint    Mike Wright is a 89 y.o. male with a hx of CAD, HFpEF, history of A-fib, history of PSVT/palpitations, PAD (previous history of gangrene and amputations), hyperlipidemia, aortic valve stenosis, and chronic anemia, CKD, who presents today for scheduled follow-up.  Previous cardiovascular history includes nonobstructive CAD seen via cardiac catheterization 2012.  NST in 2017 was negative for ischemia.  Echocardiogram around that time revealed EF 65 to 70%.  Repeat Lexiscan in 2018 was low risk study.  Underwent cardiac catheterization in 2019, received drug-eluting stent to 75% stenosis D1, DES to 75% mid to distal LAD, DES to 70% proximal LAD.  RHC findings listed below.  Was discharged on triple therapy for 1 month, then was instructed to stop aspirin.  Was admitted later that year for anemia and chest pain.  Workup negative for ACS.  The following months, admitted with chest pain and dyspnea.  CT was negative for PE, ACS workup was negative.  Diuresed 6 L with resolution of symptoms.  Underwent NST in 2021 revealed small, mild intensity mid to basal inferior/inferoseptal defect, consistent with soft tissue attenuation, no significant ischemic territories.  Last seen by Dr. Dina Rich on July 18, 2022.  With found to be severely volume overloaded, weight was up 19 pounds since February.  Based on his symptoms, it was recommended he present to [facility] ED for further evaluation and for diuresis.  Admitted to [facility] for acute on chronic CHF exacerbation.  Did receive IV diuresis, transition to p.o. torsemide twice daily, also placed on potassium supplementation.  TTE was updated-see report below.  CXR  revealed probable mild interstitial edema.  Was referred to heart failure clinic outpatient.  Hospital course complicated by sundowning, encephalopathy, declining condition was discussed with family.  Outpatient follow-up with cardiology was arranged.  Today he presents for outpatient follow-up. Overall doing well.   Weight is down from last month. Wears 3 L O2 PRN.  Denies any chest pain or worsening shortness of breath, palpitations, syncope, presyncope, dizziness, orthopnea, PND, swelling or significant weight changes, acute bleeding, or claudication.  EKGs/Labs/Other Studies Reviewed:   The following studies were reviewed today:   EKG:  EKG is not ordered today.    Cardiac monitor 08/30/2022:    3 day monitor   100% afib burden rates 31-92, avg HR 52. The lower rates were primarily in the early AM hours.   Rare ventricular ectopy in the form of isolated PVCs, couplets   No symptoms reported     Patch Wear Time:  2 days and 23 hours (2024-05-11T13:17:54-0400 to 2024-05-14T12:27:16-0400)   Atrial Fibrillation occurred continuously (100% burden), ranging from 31-92 bpm (avg of 52 bpm). Isolated VEs were rare (<1.0%), VE Couplets were rare (<1.0%), and no VE Triplets were present. MD notification criteria for Slow Atrial Fibrillation met -  report posted prior to notification per account request (VN).  Echo 06/2022:   1. Left ventricular ejection fraction, by estimation, is 70 to 75%. The  left ventricle has hyperdynamic function. The left ventricle has no  regional wall motion abnormalities. There is mild left ventricular  hypertrophy. Left ventricular diastolic  function could not be evaluated due to atrial flutter.   2.  Right ventricular systolic function is normal. The right ventricular  size is normal. There is severely elevated pulmonary artery systolic  pressure. The estimated right ventricular systolic pressure is 73.1 mmHg.   3. Left atrial size was severely dilated.   4. Right  atrial size was severely dilated.   5. The mitral valve is abnormal. No evidence of mitral valve  regurgitation. No evidence of mitral stenosis. Severe mitral annular  calcification.   6. The tricuspid valve is abnormal. Tricuspid valve regurgitation is mild  to moderate.   7. The aortic valve is calcified. Aortic valve regurgitation is mild.  Moderate to severe aortic valve stenosis. Aortic valve area, by VTI  measures 1.13 cm. Aortic valve mean gradient measures 33.0 mmHg. Aortic  valve Vmax measures 4.03 m/s. AV DVI 0.33.   8. The inferior vena cava is dilated in size with <50% respiratory  variability, suggesting right atrial pressure of 15 mmHg.   9. Increased flow velocities may be secondary to anemia, thyrotoxicosis,  hyperdynamic or high flow state.   Comparison(s): Changes from prior study are noted. AS worsened from  moderate (DVI 0.33, Vmax 3.46 m/sec, mean PG 25 mm Hg) in 10/2021 to  moderate-severe (DVI 0.33, Vmax 4.03 m/sec, mean PG 33 mm Hg) now.  Risk Assessment/Calculations:   CHA2DS2-VASc Score = 6  This indicates a 9.7% annual risk of stroke. The patient's score is based upon: CHF History: 1 HTN History: 1 Diabetes History: 1 Stroke History: 0 Vascular Disease History: 1 Age Score: 2 Gender Score: 0  Review of Systems    All other systems reviewed and are otherwise negative except as noted above.  Physical Exam    VS:  There were no vitals taken for this visit. , BMI There is no height or weight on file to calculate BMI.  Wt Readings from Last 3 Encounters:  01/16/23 169 lb 8 oz (76.9 kg)  01/11/23 162 lb (73.5 kg)  12/16/22 162 lb (73.5 kg)     GEN: Well nourished, well developed, in no acute distress, wearing chronic O2. HEENT: normal. Neck: Supple, no JVD, carotid bruits, or masses. Cardiac: S1/S2, slow rate and regular rhythm, no murmurs, rubs, or gallops. No clubbing, cyanosis, edema.  Radials/PT 2+ and equal bilaterally.  Respiratory:   Respirations regular and unlabored, clear to auscultation bilaterally. MS: No deformity or atrophy. Skin: Pale, warm and dry, no rash. Neuro:  Strength and sensation are intact. Psych: Normal affect.  Assessment & Plan    HFpEF Stage C, NYHA class I-II. Echo 06/2022 revealed 70-75% EF. Euvolemic and well compensated on exam. Continue current medication regimen. Low sodium diet, fluid restriction <2L, and daily weights encouraged. Educated to contact our office for weight gain of 2 lbs overnight or 5 lbs in one week. Follow-up with HF clinic as scheduled.   CAD Stable with no anginal symptoms. No indication for ischemic evaluation. Not on ASA d/t being on Eliquis. Continue Plavix, Imdur, and NTG PRN. Heart healthy diet and regular cardiovascular exercise encouraged. ED precautions discussed.   A-fib, bradycardia Denies any tachycardia or palpitations. HR on exam, 52 bpm, asymptomatic. Not on any HR lowering medications. Continue Eliquis, denies any bleeding issues and on appropriate dosage.   PAD, HLD Normal ABI's 04/2022. Closely followed by Dr. Myra Gianotti. LDL 26 09/2021. Continue atorvastatin. Heart healthy diet and regular cardiovascular exercise encouraged.   Aortic valve stenosis, fatigue Moderate to severe aortic valve stenosis on Echo 06/2022 with mean gradient measuring 33 mmHg. Previous Echo 10/2021  revealed moderate aortic valve stenosis with mean gradient of 25 mmHg. Pt asymptomatic overall. Continues to note fatigue - etiology multifactorial. Have set up appt with Dr. Excell Seltzer of Structural Heart Team to discuss about options, have previously referred for progressive aortic valve stenosis. Care and ED precautions discussed.   6. HTN BP stable. Discussed to monitor BP at home at least 2 hours after medications and sitting for 5-10 minutes. No change in medication regimen at this time. Heart healthy diet and regular cardiovascular exercise encouraged.   7. CKD stage 3a Most recent sCr 1.39  with eGFR at 49. Avoid nephrotoxic agents. No medication changes at this time. Continue to follow with PCP.   Disposition: Follow up in 2 month(s) with Dina Rich, MD or APP.   Signed, Sharlene Dory, NP 01/18/2023, 1:13 PM [facility]

## 2023-01-19 ENCOUNTER — Encounter: Payer: Self-pay | Admitting: Nurse Practitioner

## 2023-01-19 ENCOUNTER — Ambulatory Visit: Payer: Self-pay | Admitting: *Deleted

## 2023-01-19 ENCOUNTER — Encounter: Payer: Self-pay | Admitting: *Deleted

## 2023-01-25 ENCOUNTER — Other Ambulatory Visit: Payer: Self-pay

## 2023-01-25 DIAGNOSIS — I350 Nonrheumatic aortic (valve) stenosis: Secondary | ICD-10-CM

## 2023-01-26 DIAGNOSIS — J439 Emphysema, unspecified: Secondary | ICD-10-CM | POA: Diagnosis not present

## 2023-01-26 DIAGNOSIS — E1165 Type 2 diabetes mellitus with hyperglycemia: Secondary | ICD-10-CM | POA: Diagnosis not present

## 2023-01-26 DIAGNOSIS — K861 Other chronic pancreatitis: Secondary | ICD-10-CM | POA: Diagnosis not present

## 2023-01-26 DIAGNOSIS — Z23 Encounter for immunization: Secondary | ICD-10-CM | POA: Diagnosis not present

## 2023-01-26 DIAGNOSIS — I10 Essential (primary) hypertension: Secondary | ICD-10-CM | POA: Diagnosis not present

## 2023-01-26 DIAGNOSIS — Z299 Encounter for prophylactic measures, unspecified: Secondary | ICD-10-CM | POA: Diagnosis not present

## 2023-01-26 DIAGNOSIS — I350 Nonrheumatic aortic (valve) stenosis: Secondary | ICD-10-CM | POA: Diagnosis not present

## 2023-02-02 ENCOUNTER — Other Ambulatory Visit: Payer: Self-pay

## 2023-02-02 DIAGNOSIS — D509 Iron deficiency anemia, unspecified: Secondary | ICD-10-CM

## 2023-02-02 DIAGNOSIS — D500 Iron deficiency anemia secondary to blood loss (chronic): Secondary | ICD-10-CM

## 2023-02-02 DIAGNOSIS — D696 Thrombocytopenia, unspecified: Secondary | ICD-10-CM

## 2023-02-03 ENCOUNTER — Ambulatory Visit (HOSPITAL_COMMUNITY)
Admission: RE | Admit: 2023-02-03 | Discharge: 2023-02-03 | Disposition: A | Discharge status: Home / Self Care | Payer: Medicare HMO | Source: Ambulatory Visit | Attending: Cardiovascular Disease | Admitting: Cardiovascular Disease

## 2023-02-03 ENCOUNTER — Other Ambulatory Visit: Payer: Self-pay

## 2023-02-03 ENCOUNTER — Inpatient Hospital Stay: Payer: Medicare HMO | Attending: Hematology | Admitting: Oncology

## 2023-02-03 ENCOUNTER — Inpatient Hospital Stay: Payer: Medicare HMO | Admitting: Oncology

## 2023-02-03 ENCOUNTER — Inpatient Hospital Stay: Payer: Medicare HMO

## 2023-02-03 ENCOUNTER — Encounter (HOSPITAL_COMMUNITY)
Admission: RE | Admit: 2023-02-03 | Discharge: 2023-02-03 | Disposition: A | Discharge status: Home / Self Care | Payer: Medicare HMO | Source: Ambulatory Visit | Attending: Cardiovascular Disease

## 2023-02-03 VITALS — BP 120/65 | HR 50 | Temp 97.6°F | Resp 16

## 2023-02-03 DIAGNOSIS — D75829 Heparin-induced thrombocytopenia, unspecified: Secondary | ICD-10-CM | POA: Insufficient documentation

## 2023-02-03 DIAGNOSIS — K766 Portal hypertension: Secondary | ICD-10-CM | POA: Diagnosis not present

## 2023-02-03 DIAGNOSIS — D696 Thrombocytopenia, unspecified: Secondary | ICD-10-CM | POA: Diagnosis not present

## 2023-02-03 DIAGNOSIS — I110 Hypertensive heart disease with heart failure: Secondary | ICD-10-CM | POA: Insufficient documentation

## 2023-02-03 DIAGNOSIS — R0989 Other specified symptoms and signs involving the circulatory and respiratory systems: Secondary | ICD-10-CM | POA: Diagnosis not present

## 2023-02-03 DIAGNOSIS — D509 Iron deficiency anemia, unspecified: Secondary | ICD-10-CM

## 2023-02-03 DIAGNOSIS — I350 Nonrheumatic aortic (valve) stenosis: Secondary | ICD-10-CM | POA: Insufficient documentation

## 2023-02-03 DIAGNOSIS — D500 Iron deficiency anemia secondary to blood loss (chronic): Secondary | ICD-10-CM

## 2023-02-03 DIAGNOSIS — Z01818 Encounter for other preprocedural examination: Secondary | ICD-10-CM | POA: Diagnosis not present

## 2023-02-03 DIAGNOSIS — Z1152 Encounter for screening for COVID-19: Secondary | ICD-10-CM | POA: Insufficient documentation

## 2023-02-03 DIAGNOSIS — I517 Cardiomegaly: Secondary | ICD-10-CM | POA: Diagnosis not present

## 2023-02-03 LAB — FERRITIN: Ferritin: 70 ng/mL (ref 24–336)

## 2023-02-03 LAB — IRON AND TIBC
Iron: 51 ug/dL (ref 45–182)
Saturation Ratios: 21 % (ref 17.9–39.5)
TIBC: 242 ug/dL — ABNORMAL LOW (ref 250–450)
UIBC: 191 ug/dL

## 2023-02-03 LAB — CBC
HCT: 37.1 % — ABNORMAL LOW (ref 39.0–52.0)
Hemoglobin: 12.5 g/dL — ABNORMAL LOW (ref 13.0–17.0)
MCH: 29.8 pg (ref 26.0–34.0)
MCHC: 33.7 g/dL (ref 30.0–36.0)
MCV: 88.3 fL (ref 80.0–100.0)
Platelets: 110 10*3/uL — ABNORMAL LOW (ref 150–400)
RBC: 4.2 MIL/uL — ABNORMAL LOW (ref 4.22–5.81)
RDW: 16.1 % — ABNORMAL HIGH (ref 11.5–15.5)
WBC: 6 10*3/uL (ref 4.0–10.5)
nRBC: 0 % (ref 0.0–0.2)

## 2023-02-03 LAB — COMPREHENSIVE METABOLIC PANEL
ALT: 23 U/L (ref 0–44)
ALT: 24 U/L (ref 0–44)
AST: 26 U/L (ref 15–41)
AST: 25 U/L (ref 15–41)
Albumin: 3.8 g/dL (ref 3.5–5.0)
Albumin: 4 g/dL (ref 3.5–5.0)
Alkaline Phosphatase: 82 U/L (ref 38–126)
Alkaline Phosphatase: 81 U/L (ref 38–126)
Anion gap: 9 (ref 5–15)
Anion gap: 9 (ref 5–15)
BUN: 26 mg/dL — ABNORMAL HIGH (ref 8–23)
BUN: 28 mg/dL — ABNORMAL HIGH (ref 8–23)
CO2: 29 mmol/L (ref 22–32)
CO2: 29 mmol/L (ref 22–32)
Calcium: 9.2 mg/dL (ref 8.9–10.3)
Calcium: 8.9 mg/dL (ref 8.9–10.3)
Chloride: 103 mmol/L (ref 98–111)
Chloride: 100 mmol/L (ref 98–111)
Creatinine, Ser: 1.3 mg/dL — ABNORMAL HIGH (ref 0.61–1.24)
Creatinine, Ser: 1.29 mg/dL — ABNORMAL HIGH (ref 0.61–1.24)
GFR, Estimated: 53 mL/min — ABNORMAL LOW (ref >60)
GFR, Estimated: 54 mL/min — ABNORMAL LOW (ref >60)
Glucose, Bld: 104 mg/dL — ABNORMAL HIGH (ref 70–99)
Glucose, Bld: 85 mg/dL (ref 70–99)
Potassium: 4.1 mmol/L (ref 3.5–5.1)
Potassium: 4.1 mmol/L (ref 3.5–5.1)
Sodium: 141 mmol/L (ref 135–145)
Sodium: 138 mmol/L (ref 135–145)
Total Bilirubin: 1.1 mg/dL (ref <1.2)
Total Bilirubin: 1 mg/dL (ref <1.2)
Total Protein: 6.7 g/dL (ref 6.5–8.1)
Total Protein: 6.6 g/dL (ref 6.5–8.1)

## 2023-02-03 LAB — URINALYSIS, ROUTINE W REFLEX MICROSCOPIC
Bacteria, UA: NONE SEEN
Bilirubin Urine: NEGATIVE
Glucose, UA: NEGATIVE mg/dL
Hgb urine dipstick: NEGATIVE
Ketones, ur: NEGATIVE mg/dL
Nitrite: NEGATIVE
Protein, ur: NEGATIVE mg/dL
Specific Gravity, Urine: 1.006 (ref 1.005–1.030)
pH: 5 (ref 5.0–8.0)

## 2023-02-03 LAB — CBC WITH DIFFERENTIAL/PLATELET
Abs Immature Granulocytes: 0.07 10*3/uL (ref 0.00–0.07)
Basophils Absolute: 0 10*3/uL (ref 0.0–0.1)
Basophils Relative: 1 %
Eosinophils Absolute: 0.1 10*3/uL (ref 0.0–0.5)
Eosinophils Relative: 1 %
HCT: 37.3 % — ABNORMAL LOW (ref 39.0–52.0)
Hemoglobin: 12.3 g/dL — ABNORMAL LOW (ref 13.0–17.0)
Immature Granulocytes: 1 %
Lymphocytes Relative: 15 %
Lymphs Abs: 0.9 10*3/uL (ref 0.7–4.0)
MCH: 29.6 pg (ref 26.0–34.0)
MCHC: 33 g/dL (ref 30.0–36.0)
MCV: 89.9 fL (ref 80.0–100.0)
Monocytes Absolute: 1.2 10*3/uL — ABNORMAL HIGH (ref 0.1–1.0)
Monocytes Relative: 20 %
Neutro Abs: 3.7 10*3/uL (ref 1.7–7.7)
Neutrophils Relative %: 62 %
Platelets: 114 10*3/uL — ABNORMAL LOW (ref 150–400)
RBC: 4.15 MIL/uL — ABNORMAL LOW (ref 4.22–5.81)
RDW: 16.2 % — ABNORMAL HIGH (ref 11.5–15.5)
WBC: 5.9 10*3/uL (ref 4.0–10.5)
nRBC: 0 % (ref 0.0–0.2)

## 2023-02-03 LAB — SURGICAL PCR SCREEN
MRSA, PCR: POSITIVE — AB
Staphylococcus aureus: POSITIVE — AB

## 2023-02-03 LAB — TYPE AND SCREEN
ABO/RH(D): O POS
Antibody Screen: NEGATIVE

## 2023-02-03 LAB — PROTIME-INR
INR: 1.2 (ref 0.8–1.2)
Prothrombin Time: 15.6 s — ABNORMAL HIGH (ref 11.4–15.2)

## 2023-02-03 NOTE — Progress Notes (Unsigned)
[facility] [address], [place] [address]   Clinic Day:  02/03/2023  Referring physician: [name], MD  Patient Care Team: [name], MD as PCP - General (Internal Medicine) Wyline Mood Dorothe Pea, MD as PCP - Cardiology (Cardiology) [name], MD as Referring Physician (Internal Medicine) Gwenith Daily, RN as [facility] Care Management Doreatha Massed, MD as Medical Oncologist (Hematology)   ASSESSMENT & PLAN:   Assessment:  1.  Thrombocytopenia: - Patient seen at the request of Sharlene Dory, NP for thrombocytopenia. - CBC (08/23/2022): PLT-87, Hb-10.2, MCV-77.  WBC-6.1. - He is on Plavix and Eliquis 2.5 mg twice daily.  New medication was torsemide. - Denies any recent antibiotics.  No bleeding.  Easy bruising positive. - He had 2-3 episodes of black stools in the last 1 month.  He also takes Pepto-Bismol.  Does not take quinine compounds. -Hematology workup from 09/19/22 did not reveal any nutritional deficiencies, infection, autoimmune disorder, evidence of hemolysis or concern for bone marrow infiltrative process.  Immunofixation WNL.  -She had ultrasound of the spleen on 09/12/2022 which showed worsening splenomegaly from previous back in 2019.  2.  Social/family history: - Lives at home with his wife.  Uses walker/cane for ambulation.  Independent of ADLs and was driving until August 1914.  He is a retired Dentist.  Quit smoking 30 years ago. - No family history of thrombocytopenia.  Mother had uterine cancer.  Maternal aunt had stomach cancer.  Another maternal aunt had kidney cancer and third maternal aunt had throat cancer.  Another maternal aunt had leukemia.  Maternal grandfather had prostate cancer.  Niece had breast cancer.  Plan:  1.  Moderate thrombocytopenia: - Likely secondary to iron deficiency anemia as well as splenomegaly. -She received 1 dose of INFeD on 09/08/2022 with improvement of his iron levels  and hemoglobin.  -Repeat labs from 02/03/2023 .  2.  Severe iron deficiency anemia: - He had reaction to Feraheme in 2021 and required hospitalization for close monitoring.   -Received 1000 mg INFeD on 09/08/2022.  Tolerated well.  No infusion reaction. -Cannot tolerate oral iron.  - Repeat labs show significant improvement of his ferritin, iron saturation and hemoglobin.  He does not need any additional IV iron at this time. -Return to clinic in 6 weeks for labs only and in 12 weeks for labs and see a provider for possible additional IV iron.  3.  Positive ANA: -Discussed with patient and wife. -No signs of autoimmune disorder based on assessment today. -Continue to monitor.  No need for referral at this time.  4.  Right elbow pain: -Secondary to fall on Saturday. -Had imaging completed yesterday at [facility].  Results are unavailable in Care Everywhere.  Anticipate they will call him in the next day or so with results.  5. Abdominal pain: See scan    PLAN SUMMARY: >> Repeat labs in 6 weeks.  >> Repeat labs and see provider in 12 weeks.   I spent 20 minutes dedicated to the care of this patient (face-to-face and non-face-to-face) on the date of the encounter to include what is described in the assessment and plan.   No orders of the defined types were placed in this encounter.   [name], NP   11/8/202411:30 AM  CHIEF COMPLAINT/PURPOSE OF CONSULT:   Diagnosis: Pancytopenia   Current Therapy: INFeD x 1  HISTORY OF PRESENT ILLNESS:   Donye is a 89 y.o. male presenting to clinic  today today for follow-up.    He received 1 dose of INFeD on 09/08/2022 with good tolerance.  Previously had a reaction to IV Feraheme back in 2019.  Has been unable to tolerate oral iron in the past.  He is scheduled for TAVR on 02/07/2023.  Overall, he has been doing well.  Appetite is at 100% energy levels are 50%.  Having some coughing and shortness of breath with chest pain.  Having  occasional diarrhea and nausea.  Has dizziness when he stands.  Has trouble sleeping and numbness and tingling in bilateral upper and lower extremities.   PAST MEDICAL HISTORY:   Past Medical History: Past Medical History:  Diagnosis Date   Anemia    a. mild, noted 04/2017.   CAD in native artery    a. [place] 04/2017 s/p DES to D1, DES to prox-mid LAD, DES to prox LAD overlapping the prior stent, LVEF 55-65%.    Chronic a-fib (HCC)    Chronic diastolic CHF (congestive heart failure) (HCC)    Constipation    COPD (chronic obstructive pulmonary disease) (HCC)    Diabetic ulcer of toe (HCC)    DJD (degenerative joint disease) of cervical spine    Essential hypertension    GERD (gastroesophageal reflux disease)    History of hiatal hernia    HIT (heparin-induced thrombocytopenia) (HCC)    Hypothyroidism    Hypoxia    a. went home on home O2 04/2017.   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   Retinitis    Sinus bradycardia    a. HR 30s-40s in 04/2017 -> diltiazem stopped, metoprolol reduced.   Sleep apnea    "chose not to order CPAP at this time" (05/18/2017)   Type 2 diabetes mellitus (HCC)    Wears glasses     Surgical History: Past Surgical History:  Procedure Laterality Date   ABDOMINAL AORTOGRAM W/LOWER EXTREMITY N/A 09/15/2017   Procedure: ABDOMINAL AORTOGRAM W/LOWER EXTREMITY;  Surgeon: Nada Libman, MD;  [facility]: [facility];  Service: Cardiovascular;  Laterality: N/A;   ABDOMINAL AORTOGRAM W/LOWER EXTREMITY N/A 02/04/2020   Procedure: ABDOMINAL AORTOGRAM W/LOWER EXTREMITY;  Surgeon: Nada Libman, MD;  Location: [facility];  Service: Cardiovascular;  Laterality: N/A;   ABDOMINAL AORTOGRAM W/LOWER EXTREMITY N/A 12/15/2020   Procedure: ABDOMINAL AORTOGRAM W/LOWER EXTREMITY;  Surgeon: Nada Libman, MD;  Location: [facility];  Service:  Cardiovascular;  Laterality: N/A;   AMPUTATION Bilateral 09/20/2017   Procedure: BILATERAL GREAT TOE AMPUTATIONS INCLUDING METATARSAL HEADS;  Surgeon: Nada Libman, MD;  Location: [facility] OR;  Service: Vascular;  Laterality: Bilateral;   AMPUTATION Right 03/01/2019   Procedure: AMPUTATION TOES;  Surgeon: Chuck Hint, MD;  Location: [facility] OR;  Service: Vascular;  Laterality: Right;   AMPUTATION Right 05/29/2019   Procedure: AMPUTATION RIGHT THIRD TOE;  Surgeon: Nada Libman, MD;  Location: [facility] OR;  Service: Vascular;  Laterality: Right;   AMPUTATION Right 09/26/2019   Procedure: AMPUTATION RIGHT FOURTH TOE AND FIFTH TOES;  Surgeon: Nada Libman, MD;  Location: [facility] OR;  Service: Vascular;  Laterality: Right;   ANGIOPLASTY Right 09/20/2017   Procedure: ANGIOPLASTY RIGHT TIBIAL ARTERY;  Surgeon: Nada Libman, MD;  [facility]: [facility] OR;  Service: Vascular;  Laterality: Right;   APPENDECTOMY     APPLICATION OF WOUND VAC  12/17/2020   Procedure: APPLICATION OF WOUND VAC;  Surgeon: Nada Libman, MD;  Location: [facility] OR;  Service: Vascular;;   BACK SURGERY     CARDIAC CATHETERIZATION  1980s; 2012;   CATARACT EXTRACTION Left 2004   COLONOSCOPY  2004   FLEISHMAN TICS   COLONOSCOPY WITH PROPOFOL N/A 11/28/2018   Procedure: COLONOSCOPY WITH PROPOFOL;  Surgeon: Malissa Hippo, MD;  [facility]: [facility];  Service: Endoscopy;  Laterality: N/A;   CORONARY ANGIOPLASTY WITH STENT PLACEMENT  05/18/2017   "3 stents"   CORONARY STENT INTERVENTION N/A 05/18/2017   Procedure: CORONARY STENT INTERVENTION;  Surgeon: Corky Crafts, MD;  Location: [facility] INVASIVE CV LAB;  Service: Cardiovascular;  Laterality: N/A;   ESOPHAGOGASTRODUODENOSCOPY (EGD) WITH PROPOFOL N/A 11/20/2017   Procedure: ESOPHAGOGASTRODUODENOSCOPY (EGD) WITH PROPOFOL;  Surgeon: Hilarie Fredrickson, MD;  Location: [facility] ENDOSCOPY;  Service: Gastroenterology;  Laterality: N/A;   ESOPHAGOGASTRODUODENOSCOPY (EGD) WITH PROPOFOL N/A 11/28/2018    Procedure: ESOPHAGOGASTRODUODENOSCOPY (EGD) WITH PROPOFOL;  Surgeon: Malissa Hippo, MD;  [facility];  Service: Endoscopy;  Laterality: N/A;   ESOPHAGOGASTRODUODENOSCOPY (EGD) WITH PROPOFOL N/A 07/12/2019   Procedure: ESOPHAGOGASTRODUODENOSCOPY (EGD) WITH PROPOFOL;  Surgeon: Malissa Hippo, MD;  [facility];  Service: Endoscopy;  Laterality: N/A;  125   GIVENS CAPSULE STUDY N/A 12/17/2018   Procedure: GIVENS CAPSULE STUDY;  Surgeon: Malissa Hippo, MD;  [facility];  Service: Endoscopy;  Laterality: N/A;  7:30am   I & D EXTREMITY Right 12/17/2017   Procedure: IRRIGATION AND DEBRIDEMENT RIGHT GREAT TOE;  Surgeon: Nada Libman, MD;  [facility];  Service: Vascular;  Laterality: Right;   INCISION AND DRAINAGE Right 12/17/2020   Procedure: INCISION AND DRAINAGE OF RIGHT FOOT;  Surgeon: Nada Libman, MD;  [facility];  Service: Vascular;  Laterality: Right;   JOINT REPLACEMENT     LEFT HEART CATH AND CORONARY ANGIOGRAPHY N/A 05/18/2017   Procedure: LEFT HEART CATH AND CORONARY ANGIOGRAPHY;  Surgeon: Corky Crafts, MD;  Location: [facility] INVASIVE CV LAB;  Service: Cardiovascular;  Laterality: N/A;   LOWER EXTREMITY ANGIOGRAM Right 09/20/2017   Procedure: RIGHT LOWER LEG  ANGIOGRAM;  Surgeon: Nada Libman, MD;  [facility];  Service: Vascular;  Laterality: Right;   LUMBAR FUSION  2002   L3, 4 L4, 5 L5 S1 Fused by Dr. Trey Sailors   PERIPHERAL VASCULAR BALLOON ANGIOPLASTY Left 09/15/2017   Procedure: PERIPHERAL VASCULAR BALLOON ANGIOPLASTY;  Surgeon: Nada Libman, MD;  [facility];  Service: Cardiovascular;  Laterality: Left;  PTA of Peroneal & Posterior Tibial   PERIPHERAL VASCULAR INTERVENTION Right 12/15/2020   Procedure: PERIPHERAL VASCULAR INTERVENTION;  Surgeon: Nada Libman, MD;  [facility];  Service: Cardiovascular;  Laterality: Right;  SFA   POLYPECTOMY  11/28/2018   Procedure: POLYPECTOMY;   Surgeon: Malissa Hippo, MD;  [facility];  Service: Endoscopy;;  duodenum   POSTERIOR LUMBAR FUSION     RHINOPLASTY     RIGHT HEART CATH N/A 05/18/2017   Procedure: RIGHT HEART CATH;  Surgeon: Corky Crafts, MD;  Location: [facility] INVASIVE CV LAB;  Service: Cardiovascular;  Laterality: N/A;   RIGHT/LEFT HEART CATH AND CORONARY ANGIOGRAPHY N/A 12/16/2022   Procedure: RIGHT/LEFT HEART CATH AND CORONARY ANGIOGRAPHY;  Surgeon: Tonny Bollman, MD;  Location: [facility] INVASIVE CV LAB;  Service: Cardiovascular;  Laterality: N/A;   TEE WITHOUT CARDIOVERSION N/A 09/18/2017   Procedure: TRANSESOPHAGEAL  ECHOCARDIOGRAM (TEE);  Surgeon: Pricilla Riffle, MD;  Location: [facility] ENDOSCOPY;  Service: Cardiovascular;  Laterality: N/A;   TOTAL KNEE ARTHROPLASTY Bilateral    TRANSMETATARSAL AMPUTATION Right 02/04/2020   Procedure: RIGHT TRANSMETATARSAL AMPUTATION;  Surgeon: Nada Libman, MD;  Location: [facility] OR;  Service: Vascular;  Laterality: Right;   TRANSMETATARSAL AMPUTATION Left 11/12/2021   Procedure: LEFT TRANSMETATARSAL AMPUTATION;  Surgeon: Maeola Harman, MD;  Location: [facility] OR;  Service: Vascular;  Laterality: Left;   TRANSURETHRAL RESECTION OF PROSTATE  2001   Rito Ehrlich    Social History: Social History   Socioeconomic History   Marital status: Married    Spouse name: patsy   Number of children: Not on file   Years of education: Not on file   Highest education level: Not on file  Occupational History   Not on file  Tobacco Use   Smoking status: Former    Current packs/day: 0.00    Average packs/day: 2.0 packs/day for 30.0 years (60.0 ttl pk-yrs)    Types: Cigarettes    Start date: 03/28/1953    Quit date: 03/29/1983    Years since quitting: 39.8   Smokeless tobacco: Never  Vaping Use   Vaping status: Never Used  Substance and Sexual Activity   Alcohol use: Not Currently    Comment: 05/18/2017 'I drink a beer q yr"   Drug use: No   Sexual activity: Not Currently  Other Topics  Concern   Not on file  Social History Narrative   Patient lives at home with his spouse.    Social Determinants of Health   Financial Resource Strain: Low Risk  (12/15/2022)   Overall Financial Resource Strain (CARDIA)    Difficulty of Paying Living Expenses: Not hard at all  Food Insecurity: No Food Insecurity (08/29/2022)   Hunger Vital Sign    Worried About Running Out of Food in the Last Year: Never true    Ran Out of Food in the Last Year: Never true  Transportation Needs: No Transportation Needs (01/19/2023)   PRAPARE - Administrator, Civil Service (Medical): No    Lack of Transportation (Non-Medical): No  Physical Activity: Inactive (08/03/2022)   Exercise Vital Sign    Days of Exercise per Week: 0 days    Minutes of Exercise per Session: 0 min  Stress: No Stress Concern Present (07/29/2019)   [employer] of Occupational Health - Occupational Stress Questionnaire    Feeling of Stress : Not at all  Social Connections: Not on file  Intimate Partner Violence: Not At Risk (08/29/2022)   Humiliation, Afraid, Rape, and Kick questionnaire    Fear of Current or Ex-Partner: No    Emotionally Abused: No    Physically Abused: No    Sexually Abused: No    Family History: Family History  Problem Relation Age of Onset   Heart attack Father 31   COPD Father    COPD Mother    Heart disease Mother    Diabetes Mother    Hypertension Sister    CVA Sister    Diabetes Sister    Multiple sclerosis Sister     Current Medications:  Current Outpatient Medications:    Accu-Chek Softclix Lancets lancets, , Disp: , Rfl:    acetaminophen (TYLENOL) 500 MG tablet, Take 1 tablet (500 mg total) by mouth every 6 (six) hours as needed for mild pain or headache. (Patient taking differently: Take 1,000 mg by mouth every 8 (eight) hours as needed for  mild pain (pain score 1-3), moderate pain (pain score 4-6) or headache.), Disp: 30 tablet, Rfl: 0   Alcohol Swabs (B-D SINGLE USE SWABS  REGULAR) PADS, , Disp: , Rfl:    amLODipine (NORVASC) 2.5 MG tablet, TAKE 1 TABLET EVERY DAY, Disp: 90 tablet, Rfl: 3   apixaban (ELIQUIS) 2.5 MG TABS tablet, Take 2.5 mg by mouth 2 (two) times daily., Disp: , Rfl:    atorvastatin (LIPITOR) 40 MG tablet, Take 40 mg by mouth daily at 6 PM., Disp: , Rfl:    Blood Glucose Monitoring Suppl (ACCU-CHEK GUIDE) w/Device KIT, , Disp: , Rfl:    Cholecalciferol (VITAMIN D3) 50 MCG (2000 UT) TABS, Take 2,000 Units by mouth every evening., Disp: , Rfl:    clopidogrel (PLAVIX) 75 MG tablet, Take 1 tablet (75 mg total) by mouth daily with breakfast., Disp: 30 tablet, Rfl: 2   Cyanocobalamin (B-12) 1000 MCG TABS, Take 2,000 mcg by mouth in the morning., Disp: , Rfl:    diazepam (VALIUM) 2 MG tablet, Take 2 mg by mouth daily as needed for anxiety., Disp: , Rfl:    DROPLET INSULIN SYRINGE 31G X 5/16" 1 ML MISC, , Disp: , Rfl:    folic acid (FOLVITE) 800 MCG tablet, Take 400 mcg by mouth at bedtime., Disp: , Rfl:    insulin glargine (LANTUS) 100 UNIT/ML injection, Inject 30 Units into the skin 2 (two) times daily., Disp: , Rfl:    ipratropium-albuterol (DUONEB) 0.5-2.5 (3) MG/3ML SOLN, Take 3 mLs by nebulization every 6 (six) hours as needed (wheezing/shortness of breath)., Disp: , Rfl:    isosorbide mononitrate (IMDUR) 60 MG 24 hr tablet, TAKE 1 TABLET TWICE DAILY (DOSE INCREASE 10/26/21), Disp: 180 tablet, Rfl: 1   nitroGLYCERIN (NITROSTAT) 0.4 MG SL tablet, PLACE 1 TABLET (0.4 MG TOTAL) UNDER THE TONGUE EVERY 5 (FIVE) MINUTES AS NEEDED FOR CHEST PAIN., Disp: 25 tablet, Rfl: 11   OXYGEN, Inhale 2 L/min into the lungs See admin instructions. 2 L/min of oxygen at bedtime and during the day as needed for shortness of breath, Disp: , Rfl:    pantoprazole (PROTONIX) 40 MG tablet, Take 40 mg by mouth 2 (two) times daily., Disp: , Rfl:    potassium chloride SA (KLOR-CON M) 20 MEQ tablet, Take 1 tablet (20 mEq total) by mouth 2 (two) times daily., Disp: 30 tablet, Rfl: 1    psyllium (REGULOID) 0.52 g capsule, Take 0.52 g by mouth 2 (two) times daily., Disp: , Rfl:    SPIRIVA HANDIHALER 18 MCG inhalation capsule, Place 1 capsule into inhaler and inhale daily after breakfast. , Disp: , Rfl:    torsemide (DEMADEX) 20 MG tablet, TAKE 1 TABLET TWICE DAILY, Disp: 180 tablet, Rfl: 3   traZODone (DESYREL) 50 MG tablet, Take 100 mg by mouth at bedtime., Disp: , Rfl:    TRUE METRIX BLOOD GLUCOSE TEST test strip, , Disp: , Rfl:    Allergies: Allergies  Allergen Reactions   Codeine Shortness Of Breath   Doxycycline Swelling    Swelling and numbness in lips and face. Swelling improved after stopping. Reports still experiences numbness in bottom lip.    Feraheme [Ferumoxytol] Other (See Comments)    Diaphoretic, chest pain   Heparin Other (See Comments)    +HIT,  Severe bleeding (with heparin drip & large doses), tolerates low doses   Iron Shortness Of Breath    Patient severe reaction to IV iron and does not tolerate oral formulations either   Losartan Swelling  Oxycodone Other (See Comments)    "Made me act out of my mind" Mental status changes- hallucinations   Trelegy Ellipta [Fluticasone-Umeclidin-Vilant]     "Blood clots in throat"    REVIEW OF SYSTEMS:   Review of Systems  Constitutional:  Positive for fatigue.  Respiratory:  Positive for chest tightness and cough (Occasional).   Musculoskeletal:  Positive for arthralgias and myalgias (Right elbow).  Neurological:  Positive for numbness.  Psychiatric/Behavioral:  Positive for depression.      VITALS:   There were no vitals taken for this visit.  Wt Readings from Last 3 Encounters:  01/18/23 170 lb 12.8 oz (77.5 kg)  01/16/23 169 lb 8 oz (76.9 kg)  01/11/23 162 lb (73.5 kg)    There is no height or weight on file to calculate BMI.   PHYSICAL EXAM:   Physical Exam Vitals reviewed.  Constitutional:      Appearance: Normal appearance.  Cardiovascular:     Rate and Rhythm: Normal rate and  regular rhythm.  Pulmonary:     Effort: Pulmonary effort is normal.     Breath sounds: Normal breath sounds.  Musculoskeletal:     Right elbow: Swelling present. Decreased range of motion. Tenderness present.     Right lower leg: Edema present.  Neurological:     Mental Status: He is alert.     LABS:      Latest Ref Rng & Units 12/16/2022   10:42 AM 12/16/2022   10:20 AM 12/08/2022    1:33 PM  CBC  WBC 4.0 - 10.5 K/uL   4.4   Hemoglobin 13.0 - 17.0 g/dL 32.4  40.1    02.7  25.3   Hematocrit 39.0 - 52.0 % 30.0  33.0    33.0  39.3   Platelets 150 - 400 K/uL   136       Latest Ref Rng & Units 12/16/2022   10:42 AM 12/16/2022   10:20 AM 12/14/2022    9:46 AM  CMP  Glucose 70 - 99 mg/dL   664   BUN 8 - 27 mg/dL   21   Creatinine 4.03 - 1.27 mg/dL   4.74   Sodium 259 - 563 mmol/L 142  143    143  140   Potassium 3.5 - 5.1 mmol/L 3.7  3.9    3.9  4.6   Chloride 96 - 106 mmol/L   101   CO2 20 - 29 mmol/L   25   Calcium 8.6 - 10.2 mg/dL   8.9      No results found for: "CEA1", "CEA" / No results found for: "CEA1", "CEA" No results found for: "PSA1" No results found for: "CAN199" No results found for: "CAN125"  Lab Results  Component Value Date   TOTALPROTELP 6.7 08/29/2022   TOTALPROTELP 6.7 08/29/2022   ALBUMINELP 4.0 08/29/2022   A1GS 0.3 08/29/2022   A2GS 0.7 08/29/2022   BETS 0.8 08/29/2022   GAMS 0.9 08/29/2022   MSPIKE Not Observed 08/29/2022   SPEI Comment 08/29/2022   Lab Results  Component Value Date   TIBC 265 12/08/2022   TIBC 221 (L) 10/18/2022   TIBC 378 08/29/2022   FERRITIN 91 12/08/2022   FERRITIN 120 10/18/2022   FERRITIN 16 (L) 08/29/2022   IRONPCTSAT 26 12/08/2022   IRONPCTSAT 24 10/18/2022   IRONPCTSAT 14 (L) 08/29/2022   Lab Results  Component Value Date   LDH 161 08/29/2022   LDH 177 01/04/2018     STUDIES:  VAS [place] ABI WITH/WO TBI  Result Date: 01/16/2023  LOWER EXTREMITY DOPPLER STUDY Patient Name:  TRYAN FENWICK  Date  of Exam:   01/16/2023 Medical Rec #: [id number]        Accession #:    [id number] Date of Birth: November 12, 1933        Patient Gender: M Patient Age:   89 years Exam Location:  [facility] Procedure:      VAS [place] ABI WITH/WO TBI Referring Phys: Coral Else --------------------------------------------------------------------------------  Indications: Peripheral artery disease. right transmetatarsal amputation and              left transmetatarsal amputation High Risk Factors: Hypertension, hyperlipidemia, Diabetes, past history of                    smoking, coronary artery disease. Other Factors: CHF.  Vascular Interventions: 11/12/21 LEFT 1 and 2 toe amp                         02/04/20 Right transmetatarsal amp. Comparison Study: 11/11/21 ABI Performing Technologist: Lowell Guitar RVT, RDMS  Examination Guidelines: A complete evaluation includes at minimum, Doppler waveform signals and systolic blood pressure reading at the level of bilateral brachial, anterior tibial, and posterior tibial arteries, when vessel segments are accessible. Bilateral testing is considered an integral part of a complete examination. Photoelectric Plethysmograph (PPG) waveforms and toe systolic pressure readings are included as required and additional duplex testing as needed. Limited examinations for reoccurring indications may be performed as noted.  ABI Findings: +---------+------------------+-----+-----------+---------+ Right    Rt Pressure (mmHg)IndexWaveform   Comment   +---------+------------------+-----+-----------+---------+ Brachial 124                                         +---------+------------------+-----+-----------+---------+ PTA      107               0.86 multiphasic          +---------+------------------+-----+-----------+---------+ DP       139               1.12 multiphasic          +---------+------------------+-----+-----------+---------+ Great Toe                                   amputated +---------+------------------+-----+-----------+---------+ +---------+------------------+-----+-----------+---------+ Left     Lt Pressure (mmHg)IndexWaveform   Comment   +---------+------------------+-----+-----------+---------+ Brachial 110                                         +---------+------------------+-----+-----------+---------+ PTA      145               1.17 multiphasic          +---------+------------------+-----+-----------+---------+ DP       125               1.01 multiphasicaudible   +---------+------------------+-----+-----------+---------+ Great Toe                                  amputated +---------+------------------+-----+-----------+---------+ +-------+-----------+-----------+------------+------------+ ABI/TBIToday's ABIToday's TBIPrevious ABIPrevious TBI +-------+-----------+-----------+------------+------------+ Right  1.12       -  1.07        -            +-------+-----------+-----------+------------+------------+ Left   1.17       -          1.01                     +-------+-----------+-----------+------------+------------+  Bilateral ABIs appear essentially unchanged.  Summary: Right: Resting right ankle-brachial index is within normal range. Left: Resting left ankle-brachial index is within normal range. *See table(s) above for measurements and observations.  Electronically signed by Coral Else MD on 01/16/2023 at 3:28:06 PM.    Final    VAS [place] LOWER EXTREMITY ARTERIAL DUPLEX  Result Date: 01/16/2023 LOWER EXTREMITY ARTERIAL DUPLEX STUDY Patient Name:  LEUM TOMAN  Date of Exam:   01/16/2023 Medical Rec #: [id number]        Accession #:    [id number] Date of Birth: Jan 18, 1934        Patient Gender: M Patient Age:   38 years Exam Location:  [facility] Procedure:      VAS [place] LOWER EXTREMITY ARTERIAL DUPLEX Referring Phys: Coral Else  --------------------------------------------------------------------------------  Indications: Peripheral artery disease, and right transmetatarsal amputation and              left transmetatarsal amputation. High Risk Factors: Hypertension, hyperlipidemia, Diabetes, past history of                    smoking, coronary artery disease. Other Factors: Chronic right ATA occlusion.  Vascular               12/17/20 Right SFA stent Interventions:         02/04/20 No hemodynamically significant lesions,                        bilaterally.                        09/15/17 Left PTA and peroneal artery angioplasty.                        09/20/17 right posterior tibial artery PTA. Current ABI:           R 1.12 L 1.17 Comparison Study: 11/11/21 Left LE arterial duplex                   05/17/21 Right LE arterial duplex Performing Technologist: Lowell Guitar RVT, RDMS  Examination Guidelines: A complete evaluation includes B-mode imaging, spectral Doppler, color Doppler, and power Doppler as needed of all accessible portions of each vessel. Bilateral testing is considered an integral part of a complete examination. Limited examinations for reoccurring indications may be performed as noted.  +-----------+--------+-----+--------+----------+--------+ RIGHT      PSV cm/sRatioStenosisWaveform  Comments +-----------+--------+-----+--------+----------+--------+ CFA Distal 90                   biphasic           +-----------+--------+-----+--------+----------+--------+ DFA        107                  biphasic           +-----------+--------+-----+--------+----------+--------+ SFA Prox   66                   biphasic           +-----------+--------+-----+--------+----------+--------+  SFA Mid    120                  biphasic           +-----------+--------+-----+--------+----------+--------+ SFA Distal 137                  biphasic           +-----------+--------+-----+--------+----------+--------+ POP  Prox   54                   biphasic           +-----------+--------+-----+--------+----------+--------+ POP Distal 38                   biphasic           +-----------+--------+-----+--------+----------+--------+ ATA Distal 9                    monophasicscant    +-----------+--------+-----+--------+----------+--------+ PTA Distal 73                   biphasic           +-----------+--------+-----+--------+----------+--------+ PERO Distal45                   biphasic           +-----------+--------+-----+--------+----------+--------+  Right Stent(s): +------------------------+--------+--------+--------+--------+ Right prox-mid SFA stentPSV cm/sStenosisWaveformComments +------------------------+--------+--------+--------+--------+ Prox to Stent           66              biphasic         +------------------------+--------+--------+--------+--------+ Proximal Stent          77              biphasic         +------------------------+--------+--------+--------+--------+ Mid Stent               82              biphasic         +------------------------+--------+--------+--------+--------+ Distal Stent            70              biphasic         +------------------------+--------+--------+--------+--------+ Distal to Stent         120             biphasic         +------------------------+--------+--------+--------+--------+    +-----------+--------+-----+--------+----------+---------+ LEFT       PSV cm/sRatioStenosisWaveform  Comments  +-----------+--------+-----+--------+----------+---------+ CFA Distal 149                  biphasic            +-----------+--------+-----+--------+----------+---------+ DFA        88                   biphasic  turbulent +-----------+--------+-----+--------+----------+---------+ SFA Prox   139                  biphasic            +-----------+--------+-----+--------+----------+---------+ SFA  Mid    105                  biphasic            +-----------+--------+-----+--------+----------+---------+ SFA Distal 144                  biphasic            +-----------+--------+-----+--------+----------+---------+  POP Prox   65                   biphasic            +-----------+--------+-----+--------+----------+---------+ POP Distal 57                   biphasic            +-----------+--------+-----+--------+----------+---------+ ATA Distal 24                   monophasic          +-----------+--------+-----+--------+----------+---------+ PTA Distal 93                   monophasic          +-----------+--------+-----+--------+----------+---------+ PERO Distal34                   biphasic            +-----------+--------+-----+--------+----------+---------+  Summary: Right: Patent stent with no evidence of stenosis in the superficial femoral artery artery. Left: No evidence of focal stenosis detected throughout the arteries of the left lower extremity.  See table(s) above for measurements and observations. Electronically signed by Coral Else MD on 01/16/2023 at 3:27:18 PM.    Final

## 2023-02-03 NOTE — Progress Notes (Signed)
Patient signed all consents at PAT lab appointment. CHG soap and instructions were given to patient. CHG surgical prep reviewed with patient and all questions answered.  Patients chart send to anesthesia for review. Pt denies any respiratory illness/infection in the last two months.

## 2023-02-04 LAB — SARS CORONAVIRUS 2 (TAT 6-24 HRS): SARS Coronavirus 2: NEGATIVE

## 2023-02-06 MED ORDER — POTASSIUM CHLORIDE 2 MEQ/ML IV SOLN
80.0000 meq | INTRAVENOUS | Status: DC
  Filled 2023-02-06 (×2): qty 40

## 2023-02-06 MED ORDER — CEFAZOLIN SODIUM-DEXTROSE 2-4 GM/100ML-% IV SOLN
2.0000 g | INTRAVENOUS | Status: AC
  Administered 2023-02-07: 2 g via INTRAVENOUS
  Filled 2023-02-06: qty 100

## 2023-02-06 MED ORDER — MAGNESIUM SULFATE 50 % IJ SOLN
40.0000 meq | INTRAMUSCULAR | Status: DC
  Filled 2023-02-06 (×2): qty 9.85

## 2023-02-06 MED ORDER — NOREPINEPHRINE 4 MG/250ML-% IV SOLN
0.0000 ug/min | INTRAVENOUS | Status: DC
  Filled 2023-02-06: qty 250

## 2023-02-06 MED ORDER — BIVALIRUDIN BOLUS VIA INFUSION
0.7500 mg/kg | Freq: Once | INTRAVENOUS | Status: DC
  Filled 2023-02-06: qty 59

## 2023-02-06 MED ORDER — DEXMEDETOMIDINE HCL IN NACL 400 MCG/100ML IV SOLN
0.1000 ug/kg/h | INTRAVENOUS | Status: AC
  Administered 2023-02-07: 8 ug via INTRAVENOUS
  Administered 2023-02-07: 4 ug via INTRAVENOUS
  Filled 2023-02-06: qty 100

## 2023-02-06 MED ORDER — SODIUM CHLORIDE 0.9 % IV SOLN
1.7500 mg/kg/h | Freq: Once | INTRAVENOUS | Status: DC
  Filled 2023-02-06: qty 250

## 2023-02-06 MED ORDER — BIVALIRUDIN BOLUS VIA INFUSION
0.7500 mg/kg | Freq: Once | INTRAVENOUS | Status: AC
  Administered 2023-02-07: 58.2 mg via INTRAVENOUS
  Filled 2023-02-06: qty 59

## 2023-02-06 MED ORDER — SODIUM CHLORIDE 0.9 % IV SOLN
1.7500 mg/kg/h | Freq: Once | INTRAVENOUS | Status: AC
  Administered 2023-02-07: 1.25 mg/kg/h via INTRAVENOUS
  Filled 2023-02-06 (×2): qty 250

## 2023-02-06 NOTE — H&P (Signed)
[address]       [place] [address]             [phone]      Cardiothoracic Surgery Admission History and Physical   PCP is Mike Peri, MD Referring Provider is Mike Bollman, MD Primary Cardiologist is Mike Rich, MD   Reason for admission: Severe aortic stenosis   HPI:   The patient is an 89 year old gentleman with a history of type 2 diabetes, hypertension, hyperlipidemia, COPD, OSA not on CPAP, stage IIIa chronic kidney disease, chronic anemia, chronic atrial fibrillation, coronary artery disease status post stenting in 2019, history of heparin-induced thrombocytopenia, chronic diastolic congestive heart failure, and severe aortic stenosis who was referred for consideration of TAVR.  His most recent echocardiogram was in April 2024 and showed a calcified aortic valve with a mean gradient of 33 mmHg and a peak gradient of 65 mmHg.  Aortic valve area by VTI was 1.13 cm.  There was mild aortic insufficiency.  Left ventricular ejection fraction was 70 to 75%.  He lives with his wife.  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  He said that he rarely gets out of the house and uses a walker inside the house.  If he gets out he has to use a wheelchair.       Past Medical History:  Diagnosis Date   Anemia      a. mild, noted 04/2017.   CAD in native artery      a. [place] 04/2017 s/p DES to D1, DES to prox-mid LAD, DES to prox LAD overlapping the prior stent, LVEF 55-65%.    Chronic a-fib (HCC)     Chronic diastolic CHF (congestive heart failure) (HCC)     Constipation     COPD (chronic obstructive pulmonary disease) (HCC)     Diabetic ulcer of toe (HCC)     DJD (degenerative joint disease) of cervical spine     Essential hypertension     GERD (gastroesophageal reflux disease)     History of hiatal hernia     HIT  (heparin-induced thrombocytopenia) (HCC)     Hypothyroidism     Hypoxia      a. went home on home O2 04/2017.   Insomnia     Mixed hyperlipidemia     PAD (peripheral artery disease) (HCC)     Renal insufficiency     Retinal hemorrhage      lost 90% of vision.   Retinitis     Sinus bradycardia      a. HR 30s-40s in 04/2017 -> diltiazem stopped, metoprolol reduced.   Sleep apnea      "chose not to order CPAP at this time" (05/18/2017)   Type 2 diabetes mellitus (HCC)     Wears glasses                 Past Surgical History:  Procedure Laterality Date   ABDOMINAL AORTOGRAM W/LOWER EXTREMITY N/A 09/15/2017    Procedure: ABDOMINAL AORTOGRAM W/LOWER EXTREMITY;  Surgeon: Nada Libman, MD;  [facility]: [facility];  Service: Cardiovascular;  Laterality: N/A;   ABDOMINAL AORTOGRAM W/LOWER EXTREMITY N/A 02/04/2020    Procedure: ABDOMINAL AORTOGRAM W/LOWER EXTREMITY;  Surgeon: Nada Libman, MD;  Location: [facility];  Service: Cardiovascular;  Laterality: N/A;   ABDOMINAL AORTOGRAM  W/LOWER EXTREMITY N/A 12/15/2020    Procedure: ABDOMINAL AORTOGRAM W/LOWER EXTREMITY;  Surgeon: Nada Libman, MD;  Location: [facility];  Service: Cardiovascular;  Laterality: N/A;   AMPUTATION Bilateral 09/20/2017    Procedure: BILATERAL GREAT TOE AMPUTATIONS INCLUDING METATARSAL HEADS;  Surgeon: Nada Libman, MD;  Location: [facility] OR;  Service: Vascular;  Laterality: Bilateral;   AMPUTATION Right 03/01/2019    Procedure: AMPUTATION TOES;  Surgeon: Chuck Hint, MD;  Location: [facility] OR;  Service: Vascular;  Laterality: Right;   AMPUTATION Right 05/29/2019    Procedure: AMPUTATION RIGHT THIRD TOE;  Surgeon: Nada Libman, MD;  Location: [facility];  Service: Vascular;  Laterality: Right;   AMPUTATION Right 09/26/2019    Procedure: AMPUTATION RIGHT FOURTH TOE AND FIFTH TOES;  Surgeon: Nada Libman, MD;  Location: [facility] OR;  Service: Vascular;  Laterality: Right;   ANGIOPLASTY Right  09/20/2017    Procedure: ANGIOPLASTY RIGHT TIBIAL ARTERY;  Surgeon: Nada Libman, MD;  Location: [facility] OR;  Service: Vascular;  Laterality: Right;   APPENDECTOMY       APPLICATION OF WOUND VAC   12/17/2020    Procedure: APPLICATION OF WOUND VAC;  Surgeon: Nada Libman, MD;  [facility]: [facility];  Service: Vascular;;   BACK SURGERY       CARDIAC CATHETERIZATION   1980s; 2012;   CATARACT EXTRACTION Left 2004   COLONOSCOPY   2004    FLEISHMAN TICS   COLONOSCOPY WITH PROPOFOL N/A 11/28/2018    Procedure: COLONOSCOPY WITH PROPOFOL;  Surgeon: Malissa Hippo, MD;  [facility]: [facility];  Service: Endoscopy;  Laterality: N/A;   CORONARY ANGIOPLASTY WITH STENT PLACEMENT   05/18/2017    "3 stents"   CORONARY STENT INTERVENTION N/A 05/18/2017    Procedure: CORONARY STENT INTERVENTION;  Surgeon: Corky Crafts, MD;  Location: [facility] INVASIVE CV LAB;  Service: Cardiovascular;  Laterality: N/A;   ESOPHAGOGASTRODUODENOSCOPY (EGD) WITH PROPOFOL N/A 11/20/2017    Procedure: ESOPHAGOGASTRODUODENOSCOPY (EGD) WITH PROPOFOL;  Surgeon: Hilarie Fredrickson, MD;  Location: [facility] ENDOSCOPY;  Service: Gastroenterology;  Laterality: N/A;   ESOPHAGOGASTRODUODENOSCOPY (EGD) WITH PROPOFOL N/A 11/28/2018    Procedure: ESOPHAGOGASTRODUODENOSCOPY (EGD) WITH PROPOFOL;  Surgeon: Malissa Hippo, MD;  [facility];  Service: Endoscopy;  Laterality: N/A;   ESOPHAGOGASTRODUODENOSCOPY (EGD) WITH PROPOFOL N/A 07/12/2019    Procedure: ESOPHAGOGASTRODUODENOSCOPY (EGD) WITH PROPOFOL;  Surgeon: Malissa Hippo, MD;  [facility];  Service: Endoscopy;  Laterality: N/A;  125   GIVENS CAPSULE STUDY N/A 12/17/2018    Procedure: GIVENS CAPSULE STUDY;  Surgeon: Malissa Hippo, MD;  [facility];  Service: Endoscopy;  Laterality: N/A;  7:30am   I & D EXTREMITY Right 12/17/2017    Procedure: IRRIGATION AND DEBRIDEMENT RIGHT GREAT TOE;  Surgeon: Nada Libman, MD;  Location: [facility];  Service: Vascular;  Laterality:  Right;   INCISION AND DRAINAGE Right 12/17/2020    Procedure: INCISION AND DRAINAGE OF RIGHT FOOT;  Surgeon: Nada Libman, MD;  Location: [facility];  Service: Vascular;  Laterality: Right;   JOINT REPLACEMENT       LEFT HEART CATH AND CORONARY ANGIOGRAPHY N/A 05/18/2017    Procedure: LEFT HEART CATH AND CORONARY ANGIOGRAPHY;  Surgeon: Corky Crafts, MD;  Location: [facility] INVASIVE CV LAB;  Service: Cardiovascular;  Laterality: N/A;   LOWER EXTREMITY ANGIOGRAM Right 09/20/2017    Procedure: RIGHT LOWER LEG  ANGIOGRAM;  Surgeon: Nada Libman, MD;  Location: [facility]  OR;  Service: Vascular;  Laterality: Right;   LUMBAR FUSION   2002    L3, 4 L4, 5 L5 S1 Fused by Dr. Trey Sailors   PERIPHERAL VASCULAR BALLOON ANGIOPLASTY Left 09/15/2017    Procedure: PERIPHERAL VASCULAR BALLOON ANGIOPLASTY;  Surgeon: Nada Libman, MD;  [facility];  Service: Cardiovascular;  Laterality: Left;  PTA of Peroneal & Posterior Tibial   PERIPHERAL VASCULAR INTERVENTION Right 12/15/2020    Procedure: PERIPHERAL VASCULAR INTERVENTION;  Surgeon: Nada Libman, MD;  [facility];  Service: Cardiovascular;  Laterality: Right;  SFA   POLYPECTOMY   11/28/2018    Procedure: POLYPECTOMY;  Surgeon: Malissa Hippo, MD;  [facility];  Service: Endoscopy;;  duodenum   POSTERIOR LUMBAR FUSION       RHINOPLASTY       RIGHT HEART CATH N/A 05/18/2017    Procedure: RIGHT HEART CATH;  Surgeon: Corky Crafts, MD;  Location: [facility] INVASIVE CV LAB;  Service: Cardiovascular;  Laterality: N/A;   RIGHT/LEFT HEART CATH AND CORONARY ANGIOGRAPHY N/A 12/16/2022    Procedure: RIGHT/LEFT HEART CATH AND CORONARY ANGIOGRAPHY;  Surgeon: Mike Bollman, MD;  Location: [facility] INVASIVE CV LAB;  Service: Cardiovascular;  Laterality: N/A;   TEE WITHOUT CARDIOVERSION N/A 09/18/2017    Procedure: TRANSESOPHAGEAL ECHOCARDIOGRAM (TEE);  Surgeon: Pricilla Riffle, MD;  Location: [facility] ENDOSCOPY;  Service: Cardiovascular;   Laterality: N/A;   TOTAL KNEE ARTHROPLASTY Bilateral     TRANSMETATARSAL AMPUTATION Right 02/04/2020    Procedure: RIGHT TRANSMETATARSAL AMPUTATION;  Surgeon: Nada Libman, MD;  Location: [facility] OR;  Service: Vascular;  Laterality: Right;   TRANSMETATARSAL AMPUTATION Left 11/12/2021    Procedure: LEFT TRANSMETATARSAL AMPUTATION;  Surgeon: Maeola Harman, MD;  Location: [facility] OR;  Service: Vascular;  Laterality: Left;   TRANSURETHRAL RESECTION OF PROSTATE   2001    [place]               Family History  Problem Relation Age of Onset   Heart attack Father 53   COPD Father     COPD Mother     Heart disease Mother     Diabetes Mother     Hypertension Sister     CVA Sister     Diabetes Sister     Multiple sclerosis Sister            Social History         Socioeconomic History   Marital status: Married      Spouse name: patsy   Number of children: Not on file   Years of education: Not on file   Highest education level: Not on file  Occupational History   Not on file  Tobacco Use   Smoking status: Former      Current packs/day: 0.00      Average packs/day: 2.0 packs/day for 30.0 years (60.0 ttl pk-yrs)      Types: Cigarettes      Start date: 03/28/1953      Quit date: 03/29/1983      Years since quitting: 39.8   Smokeless tobacco: Never  Vaping Use   Vaping status: Never Used  Substance and Sexual Activity   Alcohol use: Not Currently      Comment: 05/18/2017 'I drink a beer q yr"   Drug use: No   Sexual activity: Not Currently  Other Topics Concern   Not on file  Social History Narrative    Patient lives at  home with his spouse.     Social Determinants of Health        Financial Resource Strain: Low Risk  (12/15/2022)    Overall Financial Resource Strain (CARDIA)     Difficulty of Paying Living Expenses: Not hard at all  Food Insecurity: No Food Insecurity (08/29/2022)    Hunger Vital Sign     Worried About Running Out of Food in the Last Year: Never true      Ran Out of Food in the Last Year: Never true  Transportation Needs: No Transportation Needs (01/06/2023)    PRAPARE - Therapist, art (Medical): No     Lack of Transportation (Non-Medical): No  Physical Activity: Inactive (08/03/2022)    Exercise Vital Sign     Days of Exercise per Week: 0 days     Minutes of Exercise per Session: 0 min  Stress: No Stress Concern Present (07/29/2019)    [employer] of Occupational Health - Occupational Stress Questionnaire     Feeling of Stress : Not at all  Social Connections: Not on file  Intimate Partner Violence: Not At Risk (08/29/2022)    Humiliation, Afraid, Rape, and Kick questionnaire     Fear of Current or Ex-Partner: No     Emotionally Abused: No     Physically Abused: No     Sexually Abused: No             Prior to Admission medications   Medication Sig Start Date End Date Taking? Authorizing Provider  Accu-Chek Softclix Lancets lancets   02/19/20   Yes [provider]  acetaminophen (TYLENOL) 500 MG tablet Take 1 tablet (500 mg total) by mouth every 6 (six) hours as needed for mild pain or headache. Patient taking differently: Take 1,000 mg by mouth every 8 (eight) hours as needed for mild pain (pain score 1-3), moderate pain (pain score 4-6) or headache. 03/03/19   Yes Drema Dallas, MD  Alcohol Swabs (B-D SINGLE USE SWABS REGULAR) PADS   02/17/20   Yes [provider]  amLODipine (NORVASC) 2.5 MG tablet TAKE 1 TABLET EVERY DAY 10/31/22   Yes Branch, Dorothe Pea, MD  apixaban (ELIQUIS) 2.5 MG TABS tablet Take 2.5 mg by mouth 2 (two) times daily.     Yes [provider]  atorvastatin (LIPITOR) 80 MG tablet Take 80 mg by mouth every evening.  05/23/19   Yes [provider]  Blood Glucose Monitoring Suppl (ACCU-CHEK GUIDE) w/Device KIT   02/19/20   Yes [provider]  Cholecalciferol (VITAMIN D3) 50 MCG (2000 UT) TABS Take 2,000 Units by mouth 2 (two) times daily.      Yes [provider]  clopidogrel (PLAVIX) 75 MG tablet Take 1 tablet (75 mg total) by mouth daily with breakfast. 12/19/20   Yes Baglia, Corrina, PA-C  Cyanocobalamin (B-12) 1000 MCG TABS Take 2,000 mcg by mouth daily.     Yes [provider]  diazepam (VALIUM) 2 MG tablet Take 2 mg by mouth daily as needed for anxiety. 08/09/22   Yes [provider]  DROPLET INSULIN SYRINGE 31G X 5/16" 1 ML MISC   09/24/19   Yes [provider]  folic acid (FOLVITE) 1 MG tablet Take 1 tablet (1 mg total) by mouth daily. Patient taking differently: Take 800 mcg by mouth daily. 12/06/17   Yes Meredeth Ide, MD  insulin glargine (LANTUS) 100 UNIT/ML injection Inject 30 Units into the skin 2 (  two) times daily.     Yes [provider]  ipratropium-albuterol (DUONEB) 0.5-2.5 (3) MG/3ML SOLN Take 3 mLs by nebulization every 6 (six) hours as needed (Shortness of breath). 11/08/19   Yes [provider]  isosorbide mononitrate (IMDUR) 60 MG 24 hr tablet Take 60 mg by mouth 2 (two) times daily.     Yes [provider]  nitroGLYCERIN (NITROSTAT) 0.4 MG SL tablet PLACE 1 TABLET (0.4 MG TOTAL) UNDER THE TONGUE EVERY 5 (FIVE) MINUTES AS NEEDED FOR CHEST PAIN. 12/05/22   Yes Sharlene Dory, NP  OXYGEN Inhale 2 L/min into the lungs See admin instructions. 2 L/min of oxygen at bedtime and during the day as needed for shortness of breath     Yes [provider]  pantoprazole (PROTONIX) 40 MG tablet Take 40 mg by mouth 2 (two) times daily.     Yes [provider]  potassium chloride SA (KLOR-CON M) 20 MEQ tablet Take 1 tablet (20 mEq total) by mouth 2 (two) times daily. 07/26/22   Yes Shahmehdi, Seyed A, MD  psyllium (REGULOID) 0.52 g capsule Take 0.52 g by mouth 2 (two) times daily.     Yes [provider]  SPIRIVA HANDIHALER 18 MCG inhalation capsule Place 1 capsule into inhaler and inhale daily after breakfast.  01/23/20   Yes [provider]   torsemide (DEMADEX) 20 MG tablet TAKE 1 TABLET TWICE DAILY 01/09/23   Yes Sharlene Dory, NP  traZODone (DESYREL) 50 MG tablet Take 100 mg by mouth at bedtime. 03/25/16   Yes [provider]  TRUE METRIX BLOOD GLUCOSE TEST test strip   07/12/19   Yes [provider]            Current Outpatient Medications  Medication Sig Dispense Refill   Accu-Chek Softclix Lancets lancets         acetaminophen (TYLENOL) 500 MG tablet Take 1 tablet (500 mg total) by mouth every 6 (six) hours as needed for mild pain or headache. (Patient taking differently: Take 1,000 mg by mouth every 8 (eight) hours as needed for mild pain (pain score 1-3), moderate pain (pain score 4-6) or headache.) 30 tablet 0   Alcohol Swabs (B-D SINGLE USE SWABS REGULAR) PADS         amLODipine (NORVASC) 2.5 MG tablet TAKE 1 TABLET EVERY DAY 90 tablet 3   apixaban (ELIQUIS) 2.5 MG TABS tablet Take 2.5 mg by mouth 2 (two) times daily.       atorvastatin (LIPITOR) 80 MG tablet Take 80 mg by mouth every evening.        Blood Glucose Monitoring Suppl (ACCU-CHEK GUIDE) w/Device KIT         Cholecalciferol (VITAMIN D3) 50 MCG (2000 UT) TABS Take 2,000 Units by mouth 2 (two) times daily.       clopidogrel (PLAVIX) 75 MG tablet Take 1 tablet (75 mg total) by mouth daily with breakfast. 30 tablet 2   Cyanocobalamin (B-12) 1000 MCG TABS Take 2,000 mcg by mouth daily.       diazepam (VALIUM) 2 MG tablet Take 2 mg by mouth daily as needed for anxiety.       DROPLET INSULIN SYRINGE 31G X 5/16" 1 ML MISC         folic acid (FOLVITE) 1 MG tablet Take 1 tablet (1 mg total) by mouth daily. (Patient taking differently: Take 800 mcg by mouth daily.) 30 tablet 1   insulin glargine (LANTUS) 100 UNIT/ML injection Inject 30  Units into the skin 2 (two) times daily.       ipratropium-albuterol (DUONEB) 0.5-2.5 (3) MG/3ML SOLN Take 3 mLs by nebulization every 6 (six) hours as needed (Shortness of breath).       isosorbide mononitrate (IMDUR)  60 MG 24 hr tablet Take 60 mg by mouth 2 (two) times daily.       nitroGLYCERIN (NITROSTAT) 0.4 MG SL tablet PLACE 1 TABLET (0.4 MG TOTAL) UNDER THE TONGUE EVERY 5 (FIVE) MINUTES AS NEEDED FOR CHEST PAIN. 25 tablet 11   OXYGEN Inhale 2 L/min into the lungs See admin instructions. 2 L/min of oxygen at bedtime and during the day as needed for shortness of breath       pantoprazole (PROTONIX) 40 MG tablet Take 40 mg by mouth 2 (two) times daily.       potassium chloride SA (KLOR-CON M) 20 MEQ tablet Take 1 tablet (20 mEq total) by mouth 2 (two) times daily. 30 tablet 1   psyllium (REGULOID) 0.52 g capsule Take 0.52 g by mouth 2 (two) times daily.       SPIRIVA HANDIHALER 18 MCG inhalation capsule Place 1 capsule into inhaler and inhale daily after breakfast.        torsemide (DEMADEX) 20 MG tablet TAKE 1 TABLET TWICE DAILY 180 tablet 3   traZODone (DESYREL) 50 MG tablet Take 100 mg by mouth at bedtime.       TRUE METRIX BLOOD GLUCOSE TEST test strip            No current facility-administered medications for this visit.        Allergies       Allergies  Allergen Reactions   Codeine Shortness Of Breath   Doxycycline Swelling      Swelling and numbness in lips and face. Swelling improved after stopping. Reports still experiences numbness in bottom lip.    Feraheme [Ferumoxytol] Other (See Comments)      Diaphoretic, chest pain   Heparin Other (See Comments)      +HIT,  Severe bleeding (with heparin drip & large doses), tolerates low doses   Iron Shortness Of Breath      Patient severe reaction to IV iron and does not tolerate oral formulations either   Losartan Swelling   Oxycodone Other (See Comments)      "Made me act out of my mind" Mental status changes- hallucinations   Trelegy Ellipta [Fluticasone-Umeclidin-Vilant]        "Blood clots in throat"   Latex Rash            Review of Systems:               General:                      normal appetite, + decreased energy, no  weight gain, no weight loss, no fever             Cardiac:                       no chest pain with exertion, no chest pain at rest, +SOB with mild exertion, no resting SOB, no PND, + orthopnea, no palpitations, + arrhythmia, + atrial fibrillation, no LE edema, no dizzy spells, no syncope             Respiratory:                 + exertional shortness of  breath, + home oxygen, + productive cough, no dry cough, no bronchitis, + wheezing, no hemoptysis, no asthma, no pain with inspiration or cough, + sleep apnea, no CPAP at night             GI:                               no difficulty swallowing, no reflux, + frequent heartburn, no hiatal hernia, no abdominal pain, no constipation, no diarrhea, no hematochezia, no hematemesis, no melena             GU:                              no dysuria,  + frequency, no urinary tract infection, no hematuria, no enlarged prostate, no kidney stones, + chronic kidney disease             Vascular:                     no pain suggestive of claudication, + pain in feet, no leg cramps, no varicose veins, no DVT, no non-healing foot ulcer             Neuro:                         no stroke, no TIA's, no seizures, no headaches, no temporary blindness one eye,  no slurred speech, + peripheral neuropathy, no chronic pain, + instability of gait, no memory/cognitive dysfunction             Musculoskeletal:         + arthritis, no joint swelling, no myalgias, + difficulty walking, + reduced mobility              Skin:                            no rash, no itching, no skin infections, no pressure sores or ulcerations             Psych:                         no anxiety, no depression, no nervousness, no unusual recent stress             Eyes:                           no blurry vision, no floaters, no recent vision changes, + wears glasses              ENT:                            no hearing loss, no loose or painful teeth, + dentures, last saw dentist 12/08/22              Hematologic:               + easy bruising, no abnormal bleeding, + clotting disorder ( hx of HIT), no frequent epistaxis             Endocrine:                   + diabetes, does  check CBG's at home                            Physical Exam:               BP 136/71   Pulse 87   Resp 18   Ht 5\' 7"  (1.702 m)   Wt 162 lb (73.5 kg)   SpO2 95% Comment: RA  BMI 25.37 kg/m              General:                      Elderly,  frail-appearing             HEENT:                       Unremarkable, NCAT, PERLA, EOMI             Neck:                           no JVD, no bruits, no adenopathy              Chest:                          clear to auscultation, symmetrical breath sounds, no wheezes, no rhonchi              CV:                              RRR, 3/6 systolic murmur RSB, no diastolic murmur             Abdomen:                    soft, non-tender, no masses              Extremities:                 warm, well-perfused, pedal pulses not palpable, mild bilateral lower extremity edema             Rectal/GU                   Deferred             Neuro:                         Grossly non-focal and symmetrical throughout             Skin:                            Clean and dry, no rashes, no breakdown   Diagnostic Tests:   ECHOCARDIOGRAM REPORT       Patient Name:   Mike Wright Date of Exam: 07/19/2022  Medical Rec #:  [id number]       Height:       67.0 in  Accession #:    [id number]      Weight:       194.9 lb  Date of Birth:  Sep 19, 1933       BSA:          2.000 m  Patient Age:    29 years  BP:           136/56 mmHg  Patient Gender: M               HR:           69 bpm.  Ex[facility] Location:  [facility]   Procedure: 2D Echo, Cardiac Doppler and Color Doppler   Indications:    CHF-Acute Diastolic I50.31    History:        Patient has prior history of Echocardiogram examinations,  most                 recent 11/11/2021. CHF, CAD, Arrythmias:Atrial  Fibrillation,                  Bradycardia and Atrial Flutter; Risk Factors:Hypertension,                  Diabetes and Dyslipidemia. Sleep apnea,                  PAD (peripheral artery disease).    Sonographer:    Celesta Gentile RCS  Referring Phys: [id number] OLADAPO ADEFESO   IMPRESSIONS     1. Left ventricular ejection fraction, by estimation, is 70 to 75%. The  left ventricle has hyperdynamic function. The left ventricle has no  regional wall motion abnormalities. There is mild left ventricular  hypertrophy. Left ventricular diastolic  function could not be evaluated due to atrial flutter.   2. Right ventricular systolic function is normal. The right ventricular  size is normal. There is severely elevated pulmonary artery systolic  pressure. The estimated right ventricular systolic pressure is 73.1 mmHg.   3. Left atrial size was severely dilated.   4. Right atrial size was severely dilated.   5. The mitral valve is abnormal. No evidence of mitral valve  regurgitation. No evidence of mitral stenosis. Severe mitral annular  calcification.   6. The tricuspid valve is abnormal. Tricuspid valve regurgitation is mild  to moderate.   7. The aortic valve is calcified. Aortic valve regurgitation is mild.  Moderate to severe aortic valve stenosis. Aortic valve area, by VTI  measures 1.13 cm. Aortic valve mean gradient measures 33.0 mmHg. Aortic  valve Vmax measures 4.03 m/s. AV DVI 0.33.   8. The inferior vena cava is dilated in size with <50% respiratory  variability, suggesting right atrial pressure of 15 mmHg.   9. Increased flow velocities may be secondary to anemia, thyrotoxicosis,  hyperdynamic or high flow state.   Comparison(s): Changes from prior study are noted. AS worsened from  moderate (DVI 0.33, Vmax 3.46 m/sec, mean PG 25 mm Hg) in 10/2021 to  moderate-severe (DVI 0.33, Vmax 4.03 m/sec, mean PG 33 mm Hg) now.   FINDINGS   Left Ventricle: Left ventricular ejection fraction, by estimation, is  70  to 75%. The left ventricle has hyperdynamic function. The left ventricle  has no regional wall motion abnormalities. The left ventricular internal  cavity size was normal in size.  There is mild left ventricular hypertrophy. Left ventricular diastolic  function could not be evaluated due to atrial fibrillation. Left  ventricular diastolic function could not be evaluated.   Right Ventricle: The right ventricular size is normal. No increase in  right ventricular wall thickness. Right ventricular systolic function is  normal. There is severely elevated pulmonary artery systolic pressure. The  tricuspid regurgitant velocity is  3.81 m/s, and with an assumed right atrial pressure of 15 mmHg, the  estimated right  ventricular systolic pressure is 73.1 mmHg.   Left Atrium: Left atrial size was severely dilated.   Right Atrium: Right atrial size was severely dilated.   Pericardium: There is no evidence of pericardial effusion.   Mitral Valve: The mitral valve is abnormal. Severe mitral annular  calcification. No evidence of mitral valve regurgitation. No evidence of  mitral valve stenosis.   Tricuspid Valve: The tricuspid valve is abnormal. Tricuspid valve  regurgitation is mild to moderate. No evidence of tricuspid stenosis.   Aortic Valve: The aortic valve is calcified. Aortic valve regurgitation is  mild. Aortic regurgitation PHT measures 469 msec. Moderate to severe  aortic stenosis is present. Aortic valve mean gradient measures 33.0 mmHg.  Aortic valve peak gradient measures   64.8 mmHg. Aortic valve area, by VTI measures 1.13 cm.   Pulmonic Valve: The pulmonic valve was normal in structure. Pulmonic valve  regurgitation is trivial. No evidence of pulmonic stenosis.   Aorta: The aortic root is normal in size and structure.   Venous: The inferior vena cava is dilated in size with less than 50%  respiratory variability, suggesting right atrial pressure of 15 mmHg.    IAS/Shunts: No atrial level shunt detected by color flow Doppler.     LEFT VENTRICLE  PLAX 2D  LVIDd:         5.10 cm   Diastology  LVIDs:         2.50 cm   LV e' medial:    6.74 cm/s  LV PW:         1.30 cm   LV E/e' medial:  21.8  LV IVS:        1.10 cm   LV e' lateral:   8.38 cm/s  LVOT diam:     2.00 cm   LV E/e' lateral: 17.5  LV SV:         101  LV SV Index:   51  LVOT Area:     3.14 cm     RIGHT VENTRICLE  RV S prime:     11.90 cm/s  TAPSE (M-mode): 2.0 cm   LEFT ATRIUM              Index        RIGHT ATRIUM           Index  LA diam:        4.90 cm  2.45 cm/m   RA Area:     32.20 cm  LA Vol (A2C):   112.0 ml 56.00 ml/m  RA Volume:   120.00 ml 60.00 ml/m  LA Vol (A4C):   115.0 ml 57.50 ml/m  LA Biplane Vol: 121.0 ml 60.50 ml/m   AORTIC VALVE  AV Area (Vmax):    1.05 cm  AV Area (Vmean):   1.05 cm  AV Area (VTI):     1.13 cm  AV Vmax:           402.50 cm/s  AV Vmean:          260.000 cm/s  AV VTI:            0.900 m  AV Peak Grad:      64.8 mmHg  AV Mean Grad:      33.0 mmHg  LVOT Vmax:         134.00 cm/s  LVOT Vmean:        86.700 cm/s  LVOT VTI:          0.323 m  LVOT/AV VTI ratio: 0.36  AI PHT:            469 msec    AORTA  Ao Root diam: 3.20 cm   MITRAL VALVE                TRICUSPID VALVE  MV Area (PHT): 4.06 cm     TR Peak grad:   58.1 mmHg  MV Decel Time: 187 msec     TR Vmax:        381.00 cm/s  MV E velocity: 147.00 cm/s  MV A velocity: 67.00 cm/s   SHUNTS  MV E/A ratio:  2.19         Systemic VTI:  0.32 m                              Systemic Diam: 2.00 cm   Vishnu Priya Mallipeddi  Electronically signed by Winfield Rast Mallipeddi  Signature Date/Time: 07/19/2022/2:48:15 PM        Final        Physicians   Panel Physicians Referring Physician Case Authorizing Physician  Mike Bollman, MD (Primary)        Procedures   RIGHT/LEFT HEART CATH AND CORONARY ANGIOGRAPHY    Conclusion   1.  Patent coronary arteries with  continued patency of the stented segments in the LAD and diagonal, nonobstructive plaquing in the circumflex and RCA, and no high-grade stenoses 2.  By invasive measurement there is moderate aortic stenosis with a mean gradient of 28 mmHg 3.  Right heart hemodynamics consistent with moderate pulmonary hypertension, PA pressure 52/14 mean of 29 mmHg, wedge pressure of 13 mmHg, transpulmonary gradient 16 mmHg, PVR 3.3 Wood units.   Recommendations: CT angiography studies, continue evaluation for TAVR.  Patient appears to have moderate to severe aortic stenosis and the multidisciplinary heart valve team will review all of his data to compare echo, invasive catheter evaluation, and CTA studies.  Patient can resume apixaban tomorrow morning at his normal dosing schedule.   Indications   Nonrheumatic aortic valve stenosis [I35.0 (ICD-10-CM)]    Procedural Details   Technical Details INDICATION: Complex 89 year old gentleman with coronary and peripheral arterial disease who has developed progressive and now severe aortic stenosis.  He is referred for right and left heart catheterization as part of his TAVR evaluation.  PROCEDURAL DETAILS: The right groin is prepped, draped, and anesthetized with 1% lidocaine. Using direct ultrasound guidance and a micropuncture technique, a 5 French sheath is placed in the right femoral artery. [place] images are captured and stored in the patient's chart.  There is an antecubital IV that is changed out for a 4/5 French slender sheath in the right antecubital vein.  A Swan-Ganz catheter is used for the right heart catheterization. Standard protocol is followed for recording of right heart pressures and sampling of oxygen saturations. Fick cardiac output is calculated. Standard Judkins catheters are used for selective coronary angiography. LV pressure is recorded and an aortic valve pullback gradient is recorded.  The aortic valve is crossed with an AL-1 catheter and a J-wire.  A  minx closure device is used after femoral angiography.  There are no immediate procedural complications. The patient is transferred to the post catheterization recovery area for further monitoring.      Estimated blood loss <50 mL.   During this procedure medications were administered to achieve and maintain moderate conscious sedation while the patient's heart rate, blood  pressure, and oxygen saturation were continuously monitored and I was present face-to-face 100% of this time.    Medications (Filter: Administrations occurring from [phone] to 1107 on 12/16/22)  important  Continuous medications are totaled by the amount administered until 12/16/22 1107.    midazolam (VERSED) injection (mg)  Total dose: 1 mg Date/Time Rate/Dose/Volume Action    12/16/22 1012 1 mg Given    lidocaine (PF) (XYLOCAINE) 1 % injection (mL)  Total volume: 14 mL Date/Time Rate/Dose/Volume Action    12/16/22 1014 2 mL Given    1019 12 mL Given    fentaNYL (SUBLIMAZE) injection (mcg)  Total dose: 25 mcg Date/Time Rate/Dose/Volume Action    12/16/22 1019 25 mcg Given    iohexol (OMNIPAQUE) 350 MG/ML injection (mL)  Total volume: 55 mL Date/Time Rate/Dose/Volume Action    12/16/22 1047 55 mL Given      Sedation Time   Sedation Time Physician-1: 32 minutes 54 seconds Contrast        Administrations occurring from 0941 to 1107 on 12/16/22:  Medication Name Total Dose  iohexol (OMNIPAQUE) 350 MG/ML injection 55 mL    Radiation/Fluoro   Fluoro time: 7.4 (min) DAP: 10477 (mGycm2) Cumulative Air Kerma: 299 (mGy) Coronary Findings   Diagnostic Dominance: Right Left Main  The left main is patent with no significant stenosis. The circumflex and LAD are both patent.    Left Anterior Descending  There is mild diffuse disease throughout the vessel. The LAD is patent to the apex. The stented segments in the LAD and diagonal branches are both patent with no stenosis.  Non-stenotic Prox LAD lesion was  previously treated.  Non-stenotic Prox LAD to Mid LAD lesion was previously treated. The lesion is irregular and ulcerative.    First Diagonal Branch  Non-stenotic 1st Diag lesion was previously treated.    Left Circumflex  There is mild diffuse disease throughout the vessel. The circumflex supplies a large territory. There are 2 obtuse marginals with no significant stenoses. There is mild diffuse nonobstructive plaquing in the circumflex.    Right Coronary Artery  This is a large, dominant vessel. There is minor plaquing in the proximal vessel. The distal vessel has mild nonobstructive 40% stenosis unchanged from previous studies. The distal RCA supplies to PDA branches and a PLA branch without any significant stenosis.  Prox RCA lesion is 25% stenosed.  Dist RCA lesion is 40% stenosed.    Intervention    No interventions have been documented.    Coronary Diagrams   Diagnostic Dominance: Right  Intervention    Implants       Vascular Products   Closure Mynx Control 85f - [id number] - Implanted  Inventory item: CLOSURE [facility] CONTROL 50F Model/Cat number: [id number]  Manufacturer: [employer] Lot number: [id number]  Device identifier: [id number] Device identifier type: GS1  GUDID Information         Request status Successful      Brand name: MYNX CONTROL Version/Model: [id number]  Company name: [employer] MRI safety info as of 12/16/22: MR Safe  Contains dry or latex rubber: No      GMDN P.T. name: Wound hydrogel dressing, non-antimicrobial      As of 12/16/2022   Status: Implanted          Syngo Images    Show images for CARDIAC CATHETERIZATION Images on Long Term Storage    Show images for [place], [place] to Procedure Log   Procedure Log  Link to Procedure Log   Procedure Log    Hemo Data   Flowsheet Row Most Recent Value  Fick Cardiac Output 4.78 L/min  Fick Cardiac Output Index 2.59 (L/min)/BSA  Aortic Mean Gradient 28.32 mmHg   Aortic Peak Gradient 28.7 mmHg  Aortic Valve Area 1.51  Aortic Value Area Index 0.82 cm2/BSA  RA A Wave 11 mmHg  RA V Wave 10 mmHg  RA Mean 8 mmHg  RV Systolic Pressure 58 mmHg  RV Diastolic Pressure 2 mmHg  RV EDP 8 mmHg  PA Systolic Pressure 52 mmHg  PA Diastolic Pressure 14 mmHg  PA Mean 29 mmHg  PW A Wave 15 mmHg  PW V Wave 27 mmHg  PW Mean 13 mmHg  AO Systolic Pressure 92 mmHg  AO Diastolic Pressure 40 mmHg  AO Mean 60 mmHg  LV Systolic Pressure 125 mmHg  LV Diastolic Pressure -3 mmHg  LV EDP 6 mmHg  AOp Systolic Pressure 100 mmHg  AOp Diastolic Pressure 40 mmHg  AOp Mean Pressure 58 mmHg  LVp Systolic Pressure 141 mmHg  LVp Diastolic Pressure 2 mmHg  LVp EDP Pressure 10 mmHg  QP/QS 1  TPVR Index 11.21 HRUI  TSVR Index 23.19 HRUI  PVR SVR Ratio 0.31  TPVR/TSVR Ratio 0.48      ADDENDUM REPORT: 01/02/2023 12:52   CLINICAL DATA:  75M with severe aortic stenosis being evaluated for a TAVR procedure.   EXAM: Cardiac TAVR CT   TECHNIQUE: The patient was scanned on a [employer]. A 120 kV retrospective scan was triggered in the descending thoracic aorta at 111 HU's. Gantry rotation speed was 250 msecs and collimation was .6 mm. No beta blockade or nitro were given. The 3D data set was reconstructed in 5% intervals of the R-R cycle. Systolic and diastolic phases were analyzed on a dedicated work station using MPR, MIP and VRT modes. The patient received 100 cc of contrast.   FINDINGS: Aortic Root:   Aortic valve: Tricuspid   Aortic valve calcium score: 1089   Aortic annulus:   Diameter: 28mm x 22mm   Perimeter: 78mm   Area: 468 mm^2   Calcifications: No calcifications   Coronary height: Min Left - 13mm ; Min Right - 13mm   Sinotubular height: Left cusp - 22mm; Right cusp - 21mm; Noncoronary cusp - 23mm   LVOT (as measured 3 mm below the annulus):   Diameter: 29mm x 22mm   Area: 472 mm^2   Calcifications: No calcifications    Aortic sinus width: Left cusp - 31mm; Right cusp - 31mm; Noncoronary cusp - 32mm   Sinotubular junction width: 32mm x 30mm   Optimum Fluoroscopic Angle for Delivery: RAO 21 CRA 9   Cardiac:   Right atrium: Moderate enlargement   Right ventricle: Mild dilatation   Pulmonary arteries: Normal size   Pulmonary veins: Normal configuration   Left atrium: Moderate enlargement   Left ventricle: Normal size   Pericardium: Normal thickness   Coronary arteries: Normal origin.  S/p stenting of LAD and diagonal   IMPRESSION: 1. Tricuspid aortic valve with moderate calcifications (AV calcium score 1089)   2. Aortic annulus measures 28mm x 22mm in diameter with perimeter 78mm and area 468 mm^2. No annular or LVOT calcifications. Annular measurements suitable for delivery of 26mm Edwards Sapien 3 valve.   3.  Sufficient coronary to annulus distance.   4.  Optimum Fluoroscopic Angle for Delivery:  RAO 21 CRA 9     Electronically Signed  By: Epifanio Lesches M.D.   On: 01/02/2023 12:52    Addended by Little Ishikawa, MD on 01/02/2023 12:54 PM      Narrative & Impression  CLINICAL DATA:  Aortic valve replacement   EXAM: CT ANGIOGRAPHY CHEST, ABDOMEN AND PELVIS   TECHNIQUE: Non-contrast CT of the chest was initially obtained.   Multidetector CT imaging through the chest, abdomen and pelvis was performed using the standard protocol during bolus administration of intravenous contrast. Multiplanar reconstructed images and MIPs were obtained and reviewed to evaluate the vascular anatomy.   RADIATION DOSE REDUCTION: This exam was performed according to the departmental dose-optimization program which includes automated exposure control, adjustment of the mA and/or kV according to patient size and/or use of iterative reconstruction technique.   CONTRAST:  95mL OMNIPAQUE IOHEXOL 350 MG/ML SOLN   COMPARISON:  None Available.   FINDINGS: CTA CHEST FINDINGS    Cardiovascular: Cardiomegaly. No pericardial effusion. Aortic valve thickening and calcifications. Normal caliber thoracic aorta with moderate atherosclerotic disease. Left main and three-vessel coronary artery calcifications.   Mediastinum/Nodes: Esophagus thyroid are unremarkable. No enlarged lymph nodes seen in the chest.   Lungs/Pleura: Central airways are patent. Moderate centrilobular emphysema. Mild atelectasis. No consolidation, pleural effusion or pneumothorax.   Musculoskeletal: Chronic appearing osseous deformity of the superior sternum, likely sequela of prior fracture. Multilevel degenerative disc disease. No aggressive appearing osseous lesions.   CTA ABDOMEN AND PELVIS FINDINGS   Hepatobiliary: No focal liver abnormality is seen. Gallstones. No evidence of gallbladder wall thickening. No biliary ductal dilation.   Pancreas: Pancreatic calcifications. No main pancreatic duct dilation. Cystic lesion of the uncinate process of the pancreas measuring 8 mm on series 4, image 138.   Spleen: Mild splenomegaly, measuring up to 14 cm.   Adrenals/Urinary Tract: Bilateral adrenal glands are unremarkable. No hydronephrosis or nephrolithiasis. Scarring of the upper pole of the left kidney. Simple appearing cyst of the lower pole of the right kidney, no specific follow-up imaging is necessary. Bladder is unremarkable.   Stomach/Bowel: Stomach is within normal limits. No evidence of bowel wall thickening, distention, or inflammatory changes.   Vascular/lymphatic: Normal caliber abdominal aorta with moderate atherosclerotic disease. No enlarged lymph nodes seen in the abdomen or pelvis.   Reproductive: Mild prostatomegaly.   Other: Small fat containing left inguinal hernia. No abdominopelvic ascites.   Musculoskeletal: Orthopedic hardware of the lumbar spine. Multilevel degenerative disc disease. No aggressive appearing osseous lesions.   VASCULAR MEASUREMENTS  PERTINENT TO TAVR:   AORTA:   Minimal Aortic Diameter-14.3 mm   Severity of Aortic Calcification-moderate   RIGHT PELVIS:   Right Common Iliac Artery -   Minimal Diameter-8.6 mm   Tortuosity-moderate   Calcification-moderate   Right External Iliac Artery -   Minimal Diameter-5.8 mm   Tortuosity-moderate   Calcification-mild   Right Common Femoral Artery -   Minimal Diameter-7.8 mm   Tortuosity-none   Calcification-moderate   LEFT PELVIS:   Left Common Iliac Artery -   Minimal Diameter-7.3 mm   Tortuosity-moderate   Calcification-moderate   Left External Iliac Artery -   Minimal Diameter-5.7 mm   Tortuosity-severe   Calcification-moderate   Left Common Femoral Artery -   Minimal Diameter-4.8 mm   Tortuosity-mild   Calcification-severe   Review of the MIP images confirms the above findings.   IMPRESSION: Vascular:   1. Vascular findings and measurements pertinent to potential TAVR procedure, as detailed above. 2. Thickening and calcification of the aortic valve, compatible with reported clinical  history of aortic stenosis. 3. Moderate aortoiliac atherosclerosis. Left main and 3 vessel coronary artery disease.   Nonvascular:   1. Cystic lesion of the uncinate process of the pancreas measuring 8 mm, likely a small IPMN. Recommend follow up pre and post contrast MRI/MRCP or pancreatic protocol CT in 2 years. This recommendation follows ACR consensus guidelines: Management of Incidental Pancreatic Cysts: A White Paper of the ACR Incidental Findings Committee. J Am Coll Radiol 2017;14:911-923. 2. Pancreatic calcifications, likely sequela of chronic pancreatitis. 3. Mild splenomegaly. 4. Emphysema (ICD10-J43.9).     Electronically Signed   By: Allegra Lai M.D.   On: 12/29/2022 14:10      Impression:   This 89 year old gentleman has stage D, severe, symptomatic aortic stenosis with NYHA class III symptoms of exertional fatigue  and shortness of breath consistent with chronic diastolic congestive heart failure.  I have personally reviewed his 2D echo, cardiac catheterization, and CTA studies.  His echocardiogram showed a calcified aortic valve with a mean gradient of 33 mmHg and peak gradient of 65 mmHg.  The Vmax was 4.02 m/s.  Aortic valve area was 1.05 cm.  There was mild aortic insufficiency and normal left ventricular systolic function with mild LVH.  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  The mean aortic valve gradient was measured at 28 mmHg.  There was moderate pulmonary hypertension with a PA pressure of 52/14 and a wedge of 13.  His aortic stenosis has progressed over the past year and our measurements suggest moderate to severe aortic stenosis.  His aortic valve looks severely stenotic on echocardiogram.  I agree that aortic valve replacement is indicated for relief of his symptoms and to prevent progressive decline in his functional status and ventricular function.  Given his advanced age and multiple comorbidities I do not think he would be a candidate for open surgical aortic valve replacement.  Transcatheter aortic valve replacement would be a reasonable alternative.  His gated cardiac CTA shows anatomy suitable for TAVR using a 26 mm SAPIEN 3 valve.  His abdominal and pelvic CTA shows adequate pelvic vascular anatomy to allow transfemoral insertion.  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   The patient and his wife were counseled at length regarding treatment alternatives for management of severe symptomatic aortic stenosis. The risks and benefits of surgical intervention has been discussed in detail. Long-term prognosis with medical therapy was discussed. Alternative approaches such as conventional surgical aortic valve replacement, transcatheter  aortic valve replacement, and palliative medical therapy were compared and contrasted at length. This discussion was placed in the context of the patient's own specific clinical presentation and past medical history. All of their questions have been addressed.    Following the decision to proceed with transcatheter aortic valve replacement, a discussion was held regarding what types of management strategies would be attempted intraoperatively in the event of life-threatening complications.  Given his advanced age and multiple comorbidities I do not think he is a candidate for cardiopulmonary bypass or emergent sternotomy to manage any intraoperative complications.  The patient has been advised of a variety of complications that might develop including but not limited to risks of death, stroke, paravalvular leak, aortic dissection or other major vascular complications, aortic annulus rupture, device embolization, cardiac rupture or perforation, mitral regurgitation, acute myocardial infarction, arrhythmia, heart block or bradycardia requiring permanent pacemaker placement,  congestive heart failure, respiratory failure, renal failure, pneumonia, infection, other late complications related to structural valve deterioration or migration, or other complications that might ultimately cause a temporary or permanent loss of functional independence or other long term morbidity. The patient provides full informed consent for the procedure as described and all questions were answered.       Plan:   Transfemoral TAVR using a SAPIEN 3 valve.   Alleen Borne, MD

## 2023-02-07 ENCOUNTER — Inpatient Hospital Stay (HOSPITAL_COMMUNITY): Payer: Medicare HMO

## 2023-02-07 ENCOUNTER — Inpatient Hospital Stay (HOSPITAL_COMMUNITY): Payer: Medicare HMO | Admitting: Physician Assistant

## 2023-02-07 ENCOUNTER — Other Ambulatory Visit: Payer: Self-pay | Admitting: Physician Assistant

## 2023-02-07 ENCOUNTER — Other Ambulatory Visit: Payer: Self-pay

## 2023-02-07 ENCOUNTER — Inpatient Hospital Stay (HOSPITAL_COMMUNITY): Payer: Medicare HMO | Admitting: Certified Registered"

## 2023-02-07 ENCOUNTER — Encounter (HOSPITAL_COMMUNITY): Payer: Self-pay | Admitting: Cardiovascular Disease

## 2023-02-07 ENCOUNTER — Inpatient Hospital Stay (HOSPITAL_COMMUNITY)
Admission: RE | Admit: 2023-02-07 | Discharge: 2023-02-09 | DRG: 267 | Disposition: A | Discharge status: Home Health | Payer: Medicare HMO | Attending: Cardiovascular Disease | Admitting: Cardiovascular Disease

## 2023-02-07 ENCOUNTER — Encounter (HOSPITAL_COMMUNITY)
Admission: RE | Disposition: A | Discharge status: Home Health | Payer: Self-pay | Source: Home / Self Care | Attending: Cardiovascular Disease

## 2023-02-07 DIAGNOSIS — Z955 Presence of coronary angioplasty implant and graft: Secondary | ICD-10-CM

## 2023-02-07 DIAGNOSIS — Z885 Allergy status to narcotic agent status: Secondary | ICD-10-CM

## 2023-02-07 DIAGNOSIS — D696 Thrombocytopenia, unspecified: Secondary | ICD-10-CM | POA: Diagnosis not present

## 2023-02-07 DIAGNOSIS — I2510 Atherosclerotic heart disease of native coronary artery without angina pectoris: Secondary | ICD-10-CM | POA: Diagnosis not present

## 2023-02-07 DIAGNOSIS — E1151 Type 2 diabetes mellitus with diabetic peripheral angiopathy without gangrene: Secondary | ICD-10-CM | POA: Diagnosis present

## 2023-02-07 DIAGNOSIS — I739 Peripheral vascular disease, unspecified: Secondary | ICD-10-CM

## 2023-02-07 DIAGNOSIS — J9611 Chronic respiratory failure with hypoxia: Secondary | ICD-10-CM | POA: Diagnosis present

## 2023-02-07 DIAGNOSIS — Z952 Presence of prosthetic heart valve: Secondary | ICD-10-CM | POA: Diagnosis not present

## 2023-02-07 DIAGNOSIS — E039 Hypothyroidism, unspecified: Secondary | ICD-10-CM | POA: Diagnosis present

## 2023-02-07 DIAGNOSIS — R29818 Other symptoms and signs involving the nervous system: Secondary | ICD-10-CM | POA: Diagnosis not present

## 2023-02-07 DIAGNOSIS — Z89421 Acquired absence of other right toe(s): Secondary | ICD-10-CM

## 2023-02-07 DIAGNOSIS — I25118 Atherosclerotic heart disease of native coronary artery with other forms of angina pectoris: Secondary | ICD-10-CM | POA: Diagnosis present

## 2023-02-07 DIAGNOSIS — Z89422 Acquired absence of other left toe(s): Secondary | ICD-10-CM

## 2023-02-07 DIAGNOSIS — R569 Unspecified convulsions: Secondary | ICD-10-CM | POA: Diagnosis not present

## 2023-02-07 DIAGNOSIS — Z91040 Latex allergy status: Secondary | ICD-10-CM

## 2023-02-07 DIAGNOSIS — I6782 Cerebral ischemia: Secondary | ICD-10-CM | POA: Diagnosis not present

## 2023-02-07 DIAGNOSIS — I5032 Chronic diastolic (congestive) heart failure: Secondary | ICD-10-CM | POA: Diagnosis present

## 2023-02-07 DIAGNOSIS — G8929 Other chronic pain: Secondary | ICD-10-CM | POA: Diagnosis present

## 2023-02-07 DIAGNOSIS — R4182 Altered mental status, unspecified: Secondary | ICD-10-CM | POA: Diagnosis not present

## 2023-02-07 DIAGNOSIS — I6502 Occlusion and stenosis of left vertebral artery: Secondary | ICD-10-CM | POA: Diagnosis not present

## 2023-02-07 DIAGNOSIS — Z8249 Family history of ischemic heart disease and other diseases of the circulatory system: Secondary | ICD-10-CM

## 2023-02-07 DIAGNOSIS — I6523 Occlusion and stenosis of bilateral carotid arteries: Secondary | ICD-10-CM | POA: Diagnosis not present

## 2023-02-07 DIAGNOSIS — E119 Type 2 diabetes mellitus without complications: Secondary | ICD-10-CM

## 2023-02-07 DIAGNOSIS — I272 Pulmonary hypertension, unspecified: Secondary | ICD-10-CM | POA: Diagnosis present

## 2023-02-07 DIAGNOSIS — E041 Nontoxic single thyroid nodule: Secondary | ICD-10-CM | POA: Diagnosis present

## 2023-02-07 DIAGNOSIS — Z7901 Long term (current) use of anticoagulants: Secondary | ICD-10-CM

## 2023-02-07 DIAGNOSIS — Z794 Long term (current) use of insulin: Secondary | ICD-10-CM | POA: Diagnosis not present

## 2023-02-07 DIAGNOSIS — I130 Hypertensive heart and chronic kidney disease with heart failure and stage 1 through stage 4 chronic kidney disease, or unspecified chronic kidney disease: Secondary | ICD-10-CM | POA: Diagnosis present

## 2023-02-07 DIAGNOSIS — I350 Nonrheumatic aortic (valve) stenosis: Secondary | ICD-10-CM

## 2023-02-07 DIAGNOSIS — Z7902 Long term (current) use of antithrombotics/antiplatelets: Secondary | ICD-10-CM

## 2023-02-07 DIAGNOSIS — I447 Left bundle-branch block, unspecified: Secondary | ICD-10-CM | POA: Diagnosis present

## 2023-02-07 DIAGNOSIS — I10 Essential (primary) hypertension: Secondary | ICD-10-CM | POA: Diagnosis present

## 2023-02-07 DIAGNOSIS — I4891 Unspecified atrial fibrillation: Secondary | ICD-10-CM | POA: Diagnosis not present

## 2023-02-07 DIAGNOSIS — I4819 Other persistent atrial fibrillation: Secondary | ICD-10-CM | POA: Diagnosis present

## 2023-02-07 DIAGNOSIS — F061 Catatonic disorder due to known physiological condition: Secondary | ICD-10-CM | POA: Diagnosis not present

## 2023-02-07 DIAGNOSIS — Z820 Family history of epilepsy and other diseases of the nervous system: Secondary | ICD-10-CM

## 2023-02-07 DIAGNOSIS — E782 Mixed hyperlipidemia: Secondary | ICD-10-CM | POA: Diagnosis present

## 2023-02-07 DIAGNOSIS — D649 Anemia, unspecified: Secondary | ICD-10-CM | POA: Diagnosis not present

## 2023-02-07 DIAGNOSIS — Z79899 Other long term (current) drug therapy: Secondary | ICD-10-CM | POA: Diagnosis not present

## 2023-02-07 DIAGNOSIS — R4701 Aphasia: Secondary | ICD-10-CM | POA: Diagnosis not present

## 2023-02-07 DIAGNOSIS — G928 Other toxic encephalopathy: Secondary | ICD-10-CM | POA: Diagnosis not present

## 2023-02-07 DIAGNOSIS — Z833 Family history of diabetes mellitus: Secondary | ICD-10-CM

## 2023-02-07 DIAGNOSIS — N1831 Chronic kidney disease, stage 3a: Secondary | ICD-10-CM | POA: Diagnosis present

## 2023-02-07 DIAGNOSIS — Z881 Allergy status to other antibiotic agents status: Secondary | ICD-10-CM

## 2023-02-07 DIAGNOSIS — I480 Paroxysmal atrial fibrillation: Secondary | ICD-10-CM | POA: Diagnosis present

## 2023-02-07 DIAGNOSIS — J449 Chronic obstructive pulmonary disease, unspecified: Secondary | ICD-10-CM | POA: Diagnosis not present

## 2023-02-07 DIAGNOSIS — E1140 Type 2 diabetes mellitus with diabetic neuropathy, unspecified: Secondary | ICD-10-CM | POA: Diagnosis present

## 2023-02-07 DIAGNOSIS — E049 Nontoxic goiter, unspecified: Secondary | ICD-10-CM | POA: Diagnosis not present

## 2023-02-07 DIAGNOSIS — Z981 Arthrodesis status: Secondary | ICD-10-CM

## 2023-02-07 DIAGNOSIS — E1122 Type 2 diabetes mellitus with diabetic chronic kidney disease: Secondary | ICD-10-CM | POA: Diagnosis present

## 2023-02-07 DIAGNOSIS — Z96653 Presence of artificial knee joint, bilateral: Secondary | ICD-10-CM | POA: Diagnosis present

## 2023-02-07 DIAGNOSIS — Z825 Family history of asthma and other chronic lower respiratory diseases: Secondary | ICD-10-CM

## 2023-02-07 DIAGNOSIS — Z7984 Long term (current) use of oral hypoglycemic drugs: Secondary | ICD-10-CM

## 2023-02-07 DIAGNOSIS — Z006 Encounter for examination for normal comparison and control in clinical research program: Secondary | ICD-10-CM

## 2023-02-07 DIAGNOSIS — K219 Gastro-esophageal reflux disease without esophagitis: Secondary | ICD-10-CM | POA: Diagnosis present

## 2023-02-07 DIAGNOSIS — T426X5A Adverse effect of other antiepileptic and sedative-hypnotic drugs, initial encounter: Secondary | ICD-10-CM | POA: Diagnosis not present

## 2023-02-07 DIAGNOSIS — G9340 Encephalopathy, unspecified: Secondary | ICD-10-CM | POA: Diagnosis not present

## 2023-02-07 DIAGNOSIS — Z823 Family history of stroke: Secondary | ICD-10-CM

## 2023-02-07 DIAGNOSIS — Z87891 Personal history of nicotine dependence: Secondary | ICD-10-CM

## 2023-02-07 DIAGNOSIS — Z888 Allergy status to other drugs, medicaments and biological substances status: Secondary | ICD-10-CM

## 2023-02-07 DIAGNOSIS — I672 Cerebral atherosclerosis: Secondary | ICD-10-CM | POA: Diagnosis not present

## 2023-02-07 HISTORY — DX: Presence of prosthetic heart valve: Z95.2

## 2023-02-07 HISTORY — PX: INTRAOPERATIVE TRANSTHORACIC ECHOCARDIOGRAM: SHX6523

## 2023-02-07 LAB — APTT: aPTT: 81 s — ABNORMAL HIGH (ref 24–36)

## 2023-02-07 LAB — POCT ACTIVATED CLOTTING TIME
Activated Clotting Time: 400 s
Activated Clotting Time: 544 s

## 2023-02-07 LAB — ECHOCARDIOGRAM LIMITED
AR max vel: 3.49 cm2
AV Area VTI: 3.29 cm2
AV Area mean vel: 3.32 cm2
AV Mean grad: 5 mm[Hg]
AV Peak grad: 9 mm[Hg]
Ao pk vel: 1.5 m/s
S' Lateral: 3 cm

## 2023-02-07 LAB — HEMOGLOBIN A1C
Hgb A1c MFr Bld: 6.8 % — ABNORMAL HIGH (ref 4.8–5.6)
Mean Plasma Glucose: 148.46 mg/dL

## 2023-02-07 LAB — GLUCOSE, CAPILLARY
Glucose-Capillary: 126 mg/dL — ABNORMAL HIGH (ref 70–99)
Glucose-Capillary: 116 mg/dL — ABNORMAL HIGH (ref 70–99)
Glucose-Capillary: 118 mg/dL — ABNORMAL HIGH (ref 70–99)
Glucose-Capillary: 120 mg/dL — ABNORMAL HIGH (ref 70–99)

## 2023-02-07 LAB — POCT I-STAT, CHEM 8
BUN: 28 mg/dL — ABNORMAL HIGH (ref 8–23)
Calcium, Ion: 1.21 mmol/L (ref 1.15–1.40)
Chloride: 99 mmol/L (ref 98–111)
Creatinine, Ser: 1.3 mg/dL — ABNORMAL HIGH (ref 0.61–1.24)
Glucose, Bld: 133 mg/dL — ABNORMAL HIGH (ref 70–99)
HCT: 35 % — ABNORMAL LOW (ref 39.0–52.0)
Hemoglobin: 11.9 g/dL — ABNORMAL LOW (ref 13.0–17.0)
Potassium: 3.4 mmol/L — ABNORMAL LOW (ref 3.5–5.1)
Sodium: 139 mmol/L (ref 135–145)
TCO2: 28 mmol/L (ref 22–32)

## 2023-02-07 LAB — CBC
HCT: 34 % — ABNORMAL LOW (ref 39.0–52.0)
Hemoglobin: 11.3 g/dL — ABNORMAL LOW (ref 13.0–17.0)
MCH: 28.9 pg (ref 26.0–34.0)
MCHC: 33.2 g/dL (ref 30.0–36.0)
MCV: 87 fL (ref 80.0–100.0)
Platelets: 100 10*3/uL — ABNORMAL LOW (ref 150–400)
RBC: 3.91 MIL/uL — ABNORMAL LOW (ref 4.22–5.81)
RDW: 15.9 % — ABNORMAL HIGH (ref 11.5–15.5)
WBC: 4.7 10*3/uL (ref 4.0–10.5)
nRBC: 0 % (ref 0.0–0.2)

## 2023-02-07 LAB — PROTIME-INR
INR: 1.7 — ABNORMAL HIGH (ref 0.8–1.2)
Prothrombin Time: 20.1 s — ABNORMAL HIGH (ref 11.4–15.2)

## 2023-02-07 SURGERY — TRANSCATHETER AORTIC VALVE REPLACEMENT, TRANSFEMORAL (CATHLAB)
Anesthesia: Monitor Anesthesia Care

## 2023-02-07 MED ORDER — DIAZEPAM 2 MG PO TABS: 2.0000 mg | ORAL_TABLET | Freq: Every day | ORAL | Status: DC | PRN

## 2023-02-07 MED ORDER — GLYCOPYRROLATE PF 0.2 MG/ML IJ SOSY
PREFILLED_SYRINGE | INTRAMUSCULAR | Status: DC | PRN
  Administered 2023-02-07: .2 mg via INTRAVENOUS

## 2023-02-07 MED ORDER — SODIUM CHLORIDE 0.9 % IV SOLN: 250.0000 mL | INTRAVENOUS | Status: DC | PRN

## 2023-02-07 MED ORDER — APIXABAN 2.5 MG PO TABS
2.5000 mg | ORAL_TABLET | Freq: Two times a day (BID) | ORAL | Status: DC
  Administered 2023-02-08 – 2023-02-09 (×3): 2.5 mg via ORAL
  Filled 2023-02-07 (×3): qty 1

## 2023-02-07 MED ORDER — TRAZODONE HCL 100 MG PO TABS
100.0000 mg | ORAL_TABLET | Freq: Every day | ORAL | Status: DC
  Administered 2023-02-07 – 2023-02-08 (×2): 100 mg via ORAL
  Filled 2023-02-07 (×2): qty 1

## 2023-02-07 MED ORDER — CEFAZOLIN SODIUM-DEXTROSE 2-4 GM/100ML-% IV SOLN
2.0000 g | Freq: Three times a day (TID) | INTRAVENOUS | Status: AC
  Administered 2023-02-07 – 2023-02-08 (×2): 2 g via INTRAVENOUS
  Filled 2023-02-07 (×2): qty 100

## 2023-02-07 MED ORDER — PROPOFOL 500 MG/50ML IV EMUL
INTRAVENOUS | Status: DC | PRN
  Administered 2023-02-07: 80 ug/kg/min via INTRAVENOUS

## 2023-02-07 MED ORDER — HYDRALAZINE HCL 20 MG/ML IJ SOLN: 10.0000 mg | INTRAMUSCULAR | Status: DC | PRN

## 2023-02-07 MED ORDER — PHENYLEPHRINE HCL-NACL 20-0.9 MG/250ML-% IV SOLN
INTRAVENOUS | Status: DC | PRN
  Administered 2023-02-07: 25 ug/min via INTRAVENOUS

## 2023-02-07 MED ORDER — CHLORHEXIDINE GLUCONATE 0.12 % MT SOLN
15.0000 mL | Freq: Once | OROMUCOSAL | Status: AC
Start: 2023-02-07 — End: 2023-02-07
  Administered 2023-02-07: 15 mL via OROMUCOSAL

## 2023-02-07 MED ORDER — IOHEXOL 350 MG/ML SOLN
INTRAVENOUS | Status: DC | PRN
  Administered 2023-02-07: 40 mL

## 2023-02-07 MED ORDER — LIDOCAINE HCL (PF) 1 % IJ SOLN
INTRAMUSCULAR | Status: DC | PRN
  Administered 2023-02-07 (×2): 10 mL

## 2023-02-07 MED ORDER — SODIUM CHLORIDE 0.9 % IV SOLN
INTRAVENOUS | Status: DC
Start: 2023-02-07 — End: 2023-02-07

## 2023-02-07 MED ORDER — LIDOCAINE-EPINEPHRINE 1 %-1:100000 IJ SOLN
INTRAMUSCULAR | Status: DC | PRN
  Administered 2023-02-07: 1 mL

## 2023-02-07 MED ORDER — INSULIN ASPART 100 UNIT/ML IJ SOLN: 0.0000 [IU] | INTRAMUSCULAR | Status: DC | PRN

## 2023-02-07 MED ORDER — ACETAMINOPHEN 650 MG RE SUPP: 650.0000 mg | Freq: Four times a day (QID) | RECTAL | Status: DC | PRN

## 2023-02-07 MED ORDER — STROKE: EARLY STAGES OF RECOVERY BOOK
Freq: Once | Status: AC
  Filled 2023-02-07 (×2): qty 1

## 2023-02-07 MED ORDER — CLOPIDOGREL BISULFATE 75 MG PO TABS
75.0000 mg | ORAL_TABLET | Freq: Every day | ORAL | Status: DC
  Administered 2023-02-08 – 2023-02-09 (×2): 75 mg via ORAL
  Filled 2023-02-07 (×2): qty 1

## 2023-02-07 MED ORDER — NITROGLYCERIN IN D5W 200-5 MCG/ML-% IV SOLN
0.0000 ug/min | INTRAVENOUS | Status: DC
  Administered 2023-02-08: 5 ug/min via INTRAVENOUS
  Filled 2023-02-07: qty 250

## 2023-02-07 MED ORDER — FENTANYL CITRATE (PF) 100 MCG/2ML IJ SOLN
INTRAMUSCULAR | Status: DC | PRN
  Administered 2023-02-07: 25 ug via INTRAVENOUS

## 2023-02-07 MED ORDER — CHLORHEXIDINE GLUCONATE 4 % EX SOLN: 30.0000 mL | CUTANEOUS | Status: DC

## 2023-02-07 MED ORDER — SODIUM CHLORIDE 0.9 % IV SOLN: INTRAVENOUS | Status: AC

## 2023-02-07 MED ORDER — CHLORHEXIDINE GLUCONATE 4 % EX SOLN: 60.0000 mL | Freq: Once | CUTANEOUS | Status: DC

## 2023-02-07 MED ORDER — FENTANYL CITRATE (PF) 100 MCG/2ML IJ SOLN
INTRAMUSCULAR | Status: AC
  Filled 2023-02-07: qty 2

## 2023-02-07 MED ORDER — ACETAMINOPHEN 325 MG PO TABS: 650.0000 mg | ORAL_TABLET | Freq: Four times a day (QID) | ORAL | Status: DC | PRN

## 2023-02-07 MED ORDER — IOPAMIDOL (ISOVUE-370) INJECTION 76%
99.0000 mL | Freq: Once | INTRAVENOUS | Status: AC | PRN
  Administered 2023-02-07: 99 mL via INTRAVENOUS

## 2023-02-07 MED ORDER — SODIUM CHLORIDE 0.9% FLUSH
3.0000 mL | Freq: Two times a day (BID) | INTRAVENOUS | Status: DC
  Administered 2023-02-07 – 2023-02-08 (×2): 3 mL via INTRAVENOUS

## 2023-02-07 MED ORDER — SODIUM CHLORIDE 0.9% FLUSH: 3.0000 mL | INTRAVENOUS | Status: DC | PRN

## 2023-02-07 MED ORDER — ATORVASTATIN CALCIUM 40 MG PO TABS
40.0000 mg | ORAL_TABLET | Freq: Every day | ORAL | Status: DC
  Administered 2023-02-07 – 2023-02-08 (×2): 40 mg via ORAL
  Filled 2023-02-07 (×2): qty 1

## 2023-02-07 MED ORDER — PANTOPRAZOLE SODIUM 40 MG PO TBEC
40.0000 mg | DELAYED_RELEASE_TABLET | Freq: Two times a day (BID) | ORAL | Status: DC
  Administered 2023-02-07 – 2023-02-09 (×4): 40 mg via ORAL
  Filled 2023-02-07 (×4): qty 1

## 2023-02-07 MED ORDER — AMLODIPINE BESYLATE 5 MG PO TABS
2.5000 mg | ORAL_TABLET | Freq: Every day | ORAL | Status: DC
  Administered 2023-02-07 – 2023-02-09 (×3): 2.5 mg via ORAL
  Filled 2023-02-07 (×3): qty 1

## 2023-02-07 MED ORDER — CHLORHEXIDINE GLUCONATE 4 % EX SOLN
60.0000 mL | Freq: Once | CUTANEOUS | Status: DC
Start: 2023-02-07 — End: 2023-02-07

## 2023-02-07 MED ORDER — SODIUM CHLORIDE 0.9 % IV SOLN
INTRAVENOUS | Status: DC | PRN
  Administered 2023-02-07: 1000 mL

## 2023-02-07 MED ORDER — INSULIN ASPART 100 UNIT/ML IJ SOLN
0.0000 [IU] | Freq: Three times a day (TID) | INTRAMUSCULAR | Status: DC
  Administered 2023-02-08: 4 [IU] via SUBCUTANEOUS
  Administered 2023-02-08 (×2): 2 [IU] via SUBCUTANEOUS
  Administered 2023-02-08: 4 [IU] via SUBCUTANEOUS
  Administered 2023-02-09: 2 [IU] via SUBCUTANEOUS

## 2023-02-07 MED ORDER — ISOSORBIDE MONONITRATE ER 60 MG PO TB24
60.0000 mg | ORAL_TABLET | Freq: Every day | ORAL | Status: DC
  Administered 2023-02-07 – 2023-02-09 (×3): 60 mg via ORAL
  Filled 2023-02-07 (×3): qty 1

## 2023-02-07 MED ORDER — ONDANSETRON HCL 4 MG/2ML IJ SOLN
INTRAMUSCULAR | Status: DC | PRN
  Administered 2023-02-07: 4 mg via INTRAVENOUS

## 2023-02-07 MED ORDER — ONDANSETRON HCL 4 MG/2ML IJ SOLN: 4.0000 mg | Freq: Four times a day (QID) | INTRAMUSCULAR | Status: DC | PRN

## 2023-02-07 SURGICAL SUPPLY — 27 items
BAG SNAP BAND KOVER 36X36 (MISCELLANEOUS) ×2 IMPLANT
CABLE ADAPT PACING TEMP 12FT (ADAPTER) IMPLANT
CATH 26 ULTRA DELIVERY (CATHETERS) IMPLANT
CATH DIAG 6FR PIGTAIL ANGLED (CATHETERS) IMPLANT
CATH INFINITI 5FR ANG PIGTAIL (CATHETERS) IMPLANT
CATH INFINITI 6F AL1 (CATHETERS) IMPLANT
CATH S G BIP PACING (CATHETERS) IMPLANT
CLOSURE PERCLOSE PROSTYLE (VASCULAR PRODUCTS) IMPLANT
CRIMPER (MISCELLANEOUS) IMPLANT
DEVICE INFLATION ATRION QL2530 (MISCELLANEOUS) IMPLANT
KIT MICROPUNCTURE NIT STIFF (SHEATH) IMPLANT
KIT SAPIAN 3 ULTRA RESILIA 26 (Valve) IMPLANT
PACK CARDIAC CATHETERIZATION (CUSTOM PROCEDURE TRAY) ×1 IMPLANT
PAD SORBX EP SHIELD 16.5X12 (MISCELLANEOUS) IMPLANT
SET ATX-X65L (MISCELLANEOUS) IMPLANT
SHEATH BRITE TIP 7FR 35CM (SHEATH) IMPLANT
SHEATH INTRODUCER SET 20-26 (SHEATH) IMPLANT
SHEATH PINNACLE 6F 10CM (SHEATH) IMPLANT
SHEATH PINNACLE 8F 10CM (SHEATH) IMPLANT
SHEATH PROBE COVER 6X72 (BAG) IMPLANT
SHIELD CATH-GARD CONTAMINATION (MISCELLANEOUS) IMPLANT
STOPCOCK MORSE 400PSI 3WAY (MISCELLANEOUS) ×2 IMPLANT
WIRE AMPLATZ SS-J .035X260CM (WIRE) IMPLANT
WIRE EMERALD 3MM-J .035X150CM (WIRE) IMPLANT
WIRE EMERALD 3MM-J .035X260CM (WIRE) IMPLANT
WIRE EMERALD ST .035X260CM (WIRE) IMPLANT
WIRE SAFARI SM CURVE 275 (WIRE) IMPLANT

## 2023-02-07 NOTE — Op Note (Signed)
[facility]   MULTIDISCIPLINARY HEART VALVE TEAM   TAVR OPERATIVE NOTE   Date of Procedure:  02/07/2023  Preoperative Diagnosis: Severe Aortic Stenosis   Postoperative Diagnosis: Same   Procedure:   Transcatheter Aortic Valve Replacement - Percutaneous Transfemoral Approach  Edwards Sapien 3 Ultra Resilia THV (size 26 mm, serial # [id number])   Co-Surgeons:  Evelene Croon, MD and Tonny Bollman, MD  Anesthesiologist:  Hester Mates, DO  Echocardiographer:  Charlton Haws, MD  Pre-operative Echo Findings: Severe aortic stenosis Normal left ventricular systolic function  Post-operative Echo Findings: Trivial paravalvular leak Normal/unchanged left ventricular systolic function  BRIEF CLINICAL NOTE AND INDICATIONS FOR SURGERY  89 year old gentleman with progressive, severe aortic stenosis with New York Heart Association functional class III symptoms of exertional dyspnea and fatigue consistent with chronic diastolic heart failure.  During the course of the patient's preoperative work up they have been evaluated comprehensively by a multidisciplinary team of specialists coordinated through the Multidisciplinary Heart Valve Clinic in the [facility].  They have been demonstrated to suffer from symptomatic severe aortic stenosis as noted above. The patient has been counseled extensively as to the relative risks and benefits of all options for the treatment of severe aortic stenosis including long term medical therapy, conventional surgery for aortic valve replacement, and transcatheter aortic valve replacement.  The patient has been independently evaluated in formal cardiac surgical consultation by Dr Laneta Simmers, who deemed the patient appropriate for TAVR. Based upon review of all of the patient's preoperative diagnostic tests they are felt to be candidate for transcatheter aortic valve replacement using the transfemoral approach as an alternative  to conventional surgery.    Following the decision to proceed with transcatheter aortic valve replacement, a discussion has been held regarding what types of management strategies would be attempted intraoperatively in the event of life-threatening complications, including whether or not the patient would be considered a candidate for the use of cardiopulmonary bypass and/or conversion to open sternotomy for attempted surgical intervention.  The patient has been advised of a variety of complications that might develop peculiar to this approach including but not limited to risks of death, stroke, paravalvular leak, aortic dissection or other major vascular complications, aortic annulus rupture, device embolization, cardiac rupture or perforation, acute myocardial infarction, arrhythmia, heart block or bradycardia requiring permanent pacemaker placement, congestive heart failure, respiratory failure, renal failure, pneumonia, infection, other late complications related to structural valve deterioration or migration, or other complications that might ultimately cause a temporary or permanent loss of functional independence or other long term morbidity.  The patient provides full informed consent for the procedure as described and all questions were answered preoperatively.  DETAILS OF THE OPERATIVE PROCEDURE  PREPARATION:   The patient is brought to the operating room on the above mentioned date and central monitoring was established by the anesthesia team including placement of a radial arterial line. The patient is placed in the supine position on the operating table.  Intravenous antibiotics are administered. The patient is monitored closely throughout the procedure under conscious sedation.  Baseline transthoracic echocardiogram is performed. The patient's chest, abdomen, both groins, and both lower extremities are prepared and draped in a sterile manner. A time out procedure is performed.   PERIPHERAL  ACCESS:   Using ultrasound guidance, femoral arterial and venous access is obtained with placement of 6 Fr sheaths on the left side.  [place] images are digitally captured and stored in the patient's chart. A pigtail diagnostic  catheter was passed through the femoral arterial sheath under fluoroscopic guidance into the aortic root.  A temporary transvenous pacemaker catheter was passed through the femoral venous sheath under fluoroscopic guidance into the right ventricle.  The pacemaker was tested to ensure stable lead placement and pacemaker capture. Aortic root angiography was performed in order to determine the optimal angiographic angle for valve deployment.  TRANSFEMORAL ACCESS:  A micropuncture technique is used to access the right femoral artery under fluoroscopic and ultrasound guidance.  2 Perclose devices are deployed at 10' and 2' positions to 'PreClose' the femoral artery. An 8 French sheath is placed and then an Amplatz Superstiff wire is advanced through the sheath. This is changed out for a 14 French transfemoral E-Sheath after progressively dilating over the Superstiff wire.  An AL-2 catheter was used to direct a straight-tip exchange length wire across the native aortic valve into the left ventricle. This was exchanged out for a pigtail catheter and position was confirmed in the LV apex. Simultaneous LV and Ao pressures were recorded.  The pigtail catheter was exchanged for a Safari wire in the LV apex.    BALLOON AORTIC VALVULOPLASTY:  Not performed  TRANSCATHETER HEART VALVE DEPLOYMENT:  An Edwards Sapien 3 Ultra Resilia transcatheter heart valve (size 26 mm) was prepared and crimped per manufacturer's guidelines, and the proper orientation of the valve is confirmed on the [employer] delivery system. The valve was advanced through the introducer sheath using normal technique until in an appropriate position in the abdominal aorta beyond the sheath tip. The balloon was then retracted  and using the fine-tuning wheel was centered on the valve. The valve was then advanced across the aortic arch using appropriate flexion of the catheter. The valve was carefully positioned across the aortic valve annulus. The Commander catheter was retracted using normal technique. Once final position of the valve has been confirmed by angiographic assessment, the valve is deployed while temporarily holding ventilation and during rapid ventricular pacing to maintain systolic blood pressure < 50 mmHg and pulse pressure < 10 mmHg. The balloon inflation is held for >3 seconds after reaching full deployment volume. Once the balloon has fully deflated the balloon is retracted into the ascending aorta and valve function is assessed using echocardiography. The patient's hemodynamic recovery following valve deployment is good.  The deployment balloon and guidewire are both removed. Echo demostrated acceptable post-procedural gradients, stable mitral valve function, and trivial aortic insufficiency.    PROCEDURE COMPLETION:  The sheath was removed and femoral artery closure is performed using the 2 previously deployed Perclose devices.  Protamine is administered once femoral arterial repair was complete. The site is clear with no evidence of bleeding or hematoma after the sutures are tightened. The temporary pacemaker and pigtail catheters are removed. Mynx closure is used for contralateral femoral arterial hemostasis for the 6 Fr sheath.  The patient tolerated the procedure well and is transported to the recovery area in stable condition. There were no immediate intraoperative complications. All sponge instrument and needle counts are verified correct at completion of the operation.   The patient received a total of 40 mL of intravenous contrast during the procedure.  EBL: minimal  LVEDP: 11 mmHg   Tonny Bollman, MD 02/07/2023 2:07 PM

## 2023-02-07 NOTE — Progress Notes (Addendum)
  [facility]   MULTIDISCIPLINARY HEART VALVE TEAM  Called to bedside due to inability to arouse after TAVR. 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 An elevated ACT was noted at the end of the case. Last known well around 11:20am when he went in for surgery. He has not woken up from anesthesia. He is not responding to verbal commands or responsive to painful stimuli. He did transiently track with eyes several times over the course of evaluation. CODE STROKE called and pt was able move all extremities when gag reflex was tested. He is hemodynamically stable. Bp mildly elevated. ECG with afib with slow VR and non specific IVCD in a LBBB pattern.   Pt being transported to CT to rule out ICH or large ischemic stroke. We will follow along with you.   Cline Crock PA-C  [facility]  Pager [phone]  Patient seen, examined. Available data reviewed. Agree with findings, assessment, and plan as outlined by Carlean Jews, PA-C. I was present for the patient's evaluation above and discussed his case with the anesthesia and stroke teams. Tried to call his wife but didn't get an answer.   Tonny Bollman, M.D. 02/07/2023 1:55 PM

## 2023-02-07 NOTE — Progress Notes (Signed)
Site area: L groin (arterial and venous) Site Prior to Removal:  Level 0 Pressure Applied For: 40 min ( arterial, venous) Manual:   yes Patient Status During Pull: stable   Post Pull Site:  Level 0 Post Pull Instructions Given: yes  Post Pull Pulses Present: bilateral DP by doppler Dressing Applied: gauze & tegarderm   Bedrest begins @ 1605 Comments:

## 2023-02-07 NOTE — Transfer of Care (Signed)
Immediate Anesthesia Transfer of Care Note  Patient: Mike Wright  Procedure(s) Performed: Transcatheter Aortic Valve Replacement, Transfemoral INTRAOPERATIVE TRANSTHORACIC ECHOCARDIOGRAM  Patient Location: PACU and Cath Lab  Anesthesia Type:MAC  Level of Consciousness: awake, drowsy, patient cooperative, and responds to stimulation  Airway & Oxygen Therapy: Patient Spontanous Breathing and Patient connected to nasal cannula oxygen  Post-op Assessment: Report given to RN and Post -op Vital signs reviewed and stable  Post vital signs: Reviewed and stable  Last Vitals:  Vitals Value Taken Time  BP 118/56 02/07/23 1246  Temp    Pulse 55 02/07/23 1248  Resp 14 02/07/23 1248  SpO2 100 % 02/07/23 1248  Vitals shown include unfiled device data.  Last Pain:  Vitals:   02/07/23 0935  TempSrc:   PainSc: 0-No pain         Complications: No notable events documented.

## 2023-02-07 NOTE — Op Note (Signed)
[facility]   MULTIDISCIPLINARY HEART VALVE TEAM   TAVR OPERATIVE NOTE   Date of Procedure:  02/07/2023  Preoperative Diagnosis: Severe Aortic Stenosis   Postoperative Diagnosis: Same   Procedure:   Transcatheter Aortic Valve Replacement - Percutaneous Right Transfemoral Approach  Edwards Sapien 3 Ultra Resilia THV (size 26 mm, model # 9755RSL, serial # [id number])   Co-Surgeons:  Alleen Borne, MD and Tonny Bollman, MD   Anesthesiologist:  G. Stoltzfus, DO  Echocardiographer:  P. Eden Emms, MD  Pre-operative Echo Findings: Severe aortic stenosis Normal left ventricular systolic function  Post-operative Echo Findings: Trivial paravalvular leak Normal left ventricular systolic function   BRIEF CLINICAL NOTE AND INDICATIONS FOR SURGERY   This 89 year old gentleman has stage D, severe, symptomatic aortic stenosis with NYHA class III symptoms of exertional fatigue and shortness of breath consistent with chronic diastolic congestive heart failure.  I have personally reviewed his 2D echo, cardiac catheterization, and CTA studies.  His echocardiogram showed a calcified aortic valve with a mean gradient of 33 mmHg and peak gradient of 65 mmHg.  The Vmax was 4.02 m/s.  Aortic valve area was 1.05 cm.  There was mild aortic insufficiency and normal left ventricular systolic function with mild LVH.  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  The mean aortic valve gradient was measured at 28 mmHg.  There was moderate pulmonary hypertension with a PA pressure of 52/14 and a wedge of 13.  His aortic stenosis has progressed over the past year and our measurements suggest moderate to severe aortic stenosis.  His aortic valve looks severely stenotic on echocardiogram.  I agree that aortic valve replacement is indicated for relief of his symptoms and to prevent  progressive decline in his functional status and ventricular function.  Given his advanced age and multiple comorbidities I do not think he would be a candidate for open surgical aortic valve replacement.  Transcatheter aortic valve replacement would be a reasonable alternative.  His gated cardiac CTA shows anatomy suitable for TAVR using a 26 mm SAPIEN 3 valve.  His abdominal and pelvic CTA shows adequate pelvic vascular anatomy to allow transfemoral insertion.  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   The patient and his wife were counseled at length regarding treatment alternatives for management of severe symptomatic aortic stenosis. The risks and benefits of surgical intervention has been discussed in detail. Long-term prognosis with medical therapy was discussed. Alternative approaches such as conventional surgical aortic valve replacement, transcatheter aortic valve replacement, and palliative medical therapy were compared and contrasted at length. This discussion was placed in the context of the patient's own specific clinical presentation and past medical history. All of their questions have been addressed.    Following the decision to proceed with transcatheter aortic valve replacement, a discussion was held regarding what types of management strategies would be attempted intraoperatively in the event of life-threatening complications.  Given his advanced age and multiple comorbidities I do not think he is a candidate for cardiopulmonary bypass or emergent sternotomy to manage any intraoperative complications.  The patient has been advised of a variety of complications that might develop including but not limited to risks of death, stroke, paravalvular leak, aortic dissection or other major vascular complications, aortic annulus rupture, device embolization, cardiac rupture or perforation, mitral regurgitation, acute myocardial infarction,  arrhythmia, heart block or  bradycardia requiring permanent pacemaker placement, congestive heart failure, respiratory failure, renal failure, pneumonia, infection, other late complications related to structural valve deterioration or migration, or other complications that might ultimately cause a temporary or permanent loss of functional independence or other long term morbidity. The patient provides full informed consent for the procedure as described and all questions were answered.    DETAILS OF THE OPERATIVE PROCEDURE  PREPARATION:    The patient was brought to the operating room on the above mentioned date and appropriate monitoring was established by the anesthesia team. The patient was placed in the supine position on the operating table.  Intravenous antibiotics were administered. The patient was monitored closely throughout the procedure under conscious sedation.    Baseline transthoracic echocardiogram was performed. The patient's abdomen and both groins were prepped and draped in a sterile manner. A time out procedure was performed.   PERIPHERAL ACCESS:    Using the modified Seldinger technique, femoral arterial and venous access was obtained with placement of 6 Fr sheaths on the left side.  A pigtail diagnostic catheter was passed through the left arterial sheath under fluoroscopic guidance into the aortic root.  A temporary transvenous pacemaker catheter was passed through the left femoral venous sheath under fluoroscopic guidance into the right ventricle.  The pacemaker was tested to ensure stable lead placement and pacemaker capture. Aortic root angiography was performed in order to determine the optimal angiographic angle for valve deployment.   TRANSFEMORAL ACCESS:   Percutaneous transfemoral access and sheath placement was performed using ultrasound guidance.  The right common femoral artery was cannulated using a micropuncture needle and appropriate location was verified using  hand injection angiogram.  A pair of Abbott Perclose percutaneous closure devices were placed and a 6 French sheath replaced into the femoral artery.  The patient was anticoagulated with Angiomax systemically and ACT verified > 250 seconds.    A 14 Fr transfemoral E-sheath was introduced into the right common femoral artery after progressively dilating over an Amplatz superstiff wire. An AL-2 catheter was used to direct a straight-tip exchange length wire across the native aortic valve into the left ventricle. This was exchanged out for a pigtail catheter and position was confirmed in the LV apex. Simultaneous LV and Ao pressures were recorded.  The pigtail catheter was exchanged for a Safari wire in the LV apex.   BALLOON AORTIC VALVULOPLASTY:   Not performed    TRANSCATHETER HEART VALVE DEPLOYMENT:   An Edwards Sapien 3 Ultra transcatheter heart valve (size 26 mm) was prepared and crimped per manufacturer's guidelines, and the proper orientation of the valve is confirmed on the [employer] delivery system. The valve was advanced through the introducer sheath using normal technique until in an appropriate position in the abdominal aorta beyond the sheath tip. The balloon was then retracted and using the fine-tuning wheel was centered on the valve. The valve was then advanced across the aortic arch using appropriate flexion of the catheter. The valve was carefully positioned across the aortic valve annulus. The Commander catheter was retracted using normal technique. Once final position of the valve has been confirmed by angiographic assessment, the valve is deployed during rapid ventricular pacing to maintain systolic blood pressure < 50 mmHg and pulse pressure < 10 mmHg. The balloon inflation is held for >3 seconds after reaching full deployment volume. Once the balloon has fully deflated the balloon is retracted into the ascending aorta and valve function is assessed using echocardiography. There  is felt  to be trivial paravalvular leak and no central aortic insufficiency.  The patient's hemodynamic recovery following valve deployment is good.  The deployment balloon and guidewire are both removed.    PROCEDURE COMPLETION:   The sheath was removed and femoral artery closure performed.  Protamine was administered once femoral arterial repair was complete. The temporary pacemaker and pigtail catheter were removed and the left femoral venous and arterial sheaths were sutured in place with plans for removal in 2 hrs after the Angiomax wears off.The patient tolerated the procedure well and is transported to the cath lab recovery area in stable condition. There were no immediate intraoperative complications. All sponge instrument and needle counts are verified correct at completion of the operation.   No blood products were administered during the operation.  The patient received a total of 40 mL of intravenous contrast during the procedure.   Alleen Borne, MD 02/07/2023

## 2023-02-07 NOTE — Discharge Summary (Addendum)
[facility]   MULTIDISCIPLINARY HEART VALVE TEAM  Discharge Summary    Patient ID: Mike Wright MRN: [id number]; DOB: April 03, 1933  Admit date: 02/07/2023 Discharge date: 02/09/2023  Primary Care Provider: [name], MD  Primary Cardiologist: Mike Rich, MD / Dr. Excell Seltzer, MD & Dr. Laneta Simmers, MD (TAVR)   Discharge Diagnoses    Principal Problem:   S/P TAVR (transcatheter aortic valve replacement) Active Problems:   Essential hypertension   CAD S/P percutaneous coronary angioplasty   Chronic diastolic CHF (congestive heart failure) (HCC)   PAF (paroxysmal atrial fibrillation) (HCC)   Type 2 diabetes mellitus (HCC)   COPD (chronic obstructive pulmonary disease) (HCC)   PAD (peripheral artery disease) (HCC)   Chronic respiratory failure with hypoxia (HCC)   Aortic stenosis, severe  Allergies Allergies  Allergen Reactions   Codeine Shortness Of Breath   Doxycycline Swelling    Swelling and numbness in lips and face. Swelling improved after stopping. Reports still experiences numbness in bottom lip.    Feraheme [Ferumoxytol] Other (See Comments)    Diaphoretic, chest pain   Heparin Other (See Comments)    +HIT,  Severe bleeding (with heparin drip & large doses), tolerates low doses   Iron Shortness Of Breath    Patient severe reaction to IV iron and does not tolerate oral formulations either   Losartan Swelling   Oxycodone Other (See Comments)    "Made me act out of my mind" Mental status changes- hallucinations   Latex     Added based on information entered during case entry, please review and add reactions, type, and severity as needed   Trelegy Ellipta [Fluticasone-Umeclidin-Vilant]     "Blood clots in throat"   Diagnostic Studies/Procedures    AVR OPERATIVE NOTE     Date of Procedure:                02/07/2023   Preoperative Diagnosis:      Severe Aortic Stenosis    Postoperative Diagnosis:    Same    Procedure:        Transcatheter  Aortic Valve Replacement - Percutaneous Right Transfemoral Approach             Edwards Sapien 3 Ultra Resilia THV (size 26 mm, model # 9755RSL, serial # [id number])              Co-Surgeons:                        Mike Borne, MD and Mike Bollman, MD    Anesthesiologist:                  Mike Atlas, DO   Echocardiographer:              Mike Forts, MD   Pre-operative Echo Findings: Severe aortic stenosis Normal left ventricular systolic function   Post-operative Echo Findings: Trivial paravalvular leak Normal left ventricular systolic function  _____________   Echo 02/08/23:  IMPRESSIONS   1. The aortic valve has been repaired/replaced. Aortic valve  regurgitation is not visualized. There is a 26 mm Sapien prosthetic (TAVR)  valve present in the aortic position. Procedure Date: 02/07/2023. Echo  findings are consistent with normal structure  and function of the aortic valve prosthesis. Aortic valve area, by VTI  measures 2.49 cm. Aortic valve mean gradient measures 9.5 mmHg. Aortic  valve Vmax measures 2.24 m/s.   2. Left ventricular ejection fraction, by estimation, is 65  to 70%. The  left ventricle has normal function. The left ventricle has no regional  wall motion abnormalities. There is mild concentric left ventricular  hypertrophy. Left ventricular diastolic  function could not be evaluated.   3. Right ventricular systolic function is mildly reduced. The right  ventricular size is moderately enlarged. Tricuspid regurgitation signal is  inadequate for assessing PA pressure.   4. Left atrial size was severely dilated.   5. Right atrial size was mildly dilated.   6. The mitral valve is degenerative. Mild mitral valve regurgitation. No  evidence of mitral stenosis. Moderate mitral annular calcification.   7. The inferior vena cava is normal in size with greater than 50%  respiratory variability, suggesting right atrial pressure of 3 mmHg.    History of Present  Illness     Mike Wright is a 89 y.o. male with a complex medical history of CAD s/p PCI to D1/mLAD/pLAD 2019, HFpEF with 07/2022 admission, COPD home O2 PRN, persistent Afib on eliquis, HIT, PAD with previous toe amputations, balance issues, neuropathy, CKD stage IIIa, chronic anemia/thrombocytopenia, history of encephalopathy, T2DM, HTN, HLD, OSA not on CPAP and severe AS who presented to [facility] on 02/07/2023 for planned TAVR.   [name] is followed by Dr. Wyline Wright for his cardiology care. He was admitted from 4/22-07/27/22 for acute CHF. He had issues with encephalopathy and sundowning. He was also seen by palliative care during this admission. He was discharge on 3L of home oxygen. He has had progressive aortic stenosis. Echo 07/19/22 showed EF 70% and severe aortic stenosis with a mean gradient at 52 mmHg, AVA 0.80 cm2, and mod-severe TR. [facility] 12/16/22 showed continued patency of the stented segments in the LAD and diagonal, nonobstructive plaquing in the circumflex and RCA, and no high-grade stenoses.    He was evaluated by the multidisciplinary valve team and felt to have severe, symptomatic severe aortic stenosis and to be a suitable candidate for TAVR, which was set up for 11/12/204.   Hospital Course     Consultants: none   Severe AS: s/p successful TAVR with a 26 mm Edwards Sapien 3 THV via the TF approach on 02/07/23. Post operative echo showed EF 65%, mod RVE, normally functioning TAVR with a mean gradient of 9.5 mmHg and no PVL as well as mod MAC and mild MS. Groin sites are stable. ECG with chronic afib and no high grade heart block. Continued on home Eliquis 5mg  BID and Plavix 75mg  daily. Walked with cardiac rehab with no issues. Plan for discharge home today with close follow up in the outpatient setting.   Acute encephalopathy: the patient was unresponsive after TAVR for a prolonged period of time. He had no focal neurologic deficits but globally unresponsive. Code stroke was called and  all imaging negative for acute CVA. MRI prematurely terminated bc pt could not sit still. EEG was negative. He is now back to his baseline cognitive status. Appreciate neuro team.  CAD: [facility] 12/16/22 showed continued patency of the stented segments in the LAD and diagonal, nonobstructive plaquing in the circumflex and RCA, and no high-grade stenoses. Continue medical therapy with Plavix 75mg  daily and Lipitor 40mg  daily. He has chronic stable angina. Resume home SL NTG.   History of HIT: Angiomax was used instead of heparin.    HFpEF: LVEDP noted to be 11. He appears euvolemic. Resumed on home Demadex 20mg  BID and Kdur 20 meq BID.    Persistent atrial fibrillation: restarted on low dose Eliquis. He  is well rate controlled off AV nodal blocking agents.   CKD Stage IIIa: creat 1.54 today, which is within his baseline.    Chronic anemia/thrombocytopenia: Hg/plt stable at 10.5/78. Platelets with slight downward trend. Followed by heme/onc in the outpatient setting.    PAD: s/p multiple metatarsal amputations. Follows with [facility].    COPD: wears PRN oxygen. Continue Spiriva, Duoneb  HTN: BP well controlled currently. Resumed on home antihypertensives.  DMT2: treated with SSI while admitted. Resume home meds at discharge.  Lesion of pancreas: pre TAVR CT showed a "cystic lesion of the uncinate process of the pancreas measuring 8 mm, likely a small IPMN. Recommend follow up pre and post contrast MRI/MRCP or pancreatic protocol CT in 2 years. Pancreatic calcifications, likely sequela of chronic pancreatitis." This will be discussed in the outpatient setting.   Thyroid nodule: CTA noted a "heterogeneous and enlarged left thyroid lobe extending into the upper mediastinum. A non-emergent thyroid ultrasound is recommended for further evaluation." This will be discussed in the outpatient setting.   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 Per neuro, "consider re-imaging with CTA of head and neck  in 4-6 weeks to assess for resolution of the possible distal left vertebral artery thrombus seen on CTA. The apparent finding is likely too small for MRA to be of utility as a follow up imaging modality." This will be discussed in the outpatient setting.   Dispo: plan home today with HHPT, pt declines HHOT. _____________  Discharge Vitals Blood pressure (!) 145/64, pulse 62, temperature 98.2 F (36.8 C), temperature source Oral, resp. rate 20, height 5\' 7"  (1.702 m), weight 80.2 kg, SpO2 91%.  Filed Weights   02/07/23 0913 02/08/23 0500 02/09/23 0554  Weight: 77.4 kg 76.7 kg 80.2 kg     GEN: Well nourished, well developed, in no acute distress HEENT: normal Neck: no JVD or masses Cardiac: irreg irreg; no murmurs, rubs, or gallops,no edema  Respiratory:  clear to auscultation bilaterally, normal work of breathing GI: soft, nontender, nondistended, + BS MS: no deformity or atrophy Skin: warm and dry, no rash.  Groin sites clear without hematoma or ecchymosis  Neuro:  Alert and Oriented x 3, Strength and sensation are intact Psych: euthymic Wright, full affect   Disposition   Pt is being discharged home today in good condition.  Follow-up Plans & Appointments     Follow-up Information     Janetta Hora, PA-C. Go on 02/17/2023.   Specialties: Cardiology, Radiology Why: @ 11:05am, please arrive at least 10 minutes early. Contact information: [address] [place] [address] [phone]                  Discharge Medications   Allergies as of 02/09/2023       Reactions   Codeine Shortness Of Breath   Doxycycline Swelling   Swelling and numbness in lips and face. Swelling improved after stopping. Reports still experiences numbness in bottom lip.    Feraheme [ferumoxytol] Other (See Comments)   Diaphoretic, chest pain   Heparin Other (See Comments)   +HIT,  Severe bleeding (with heparin drip & large doses), tolerates low doses   Iron  Shortness Of Breath   Patient severe reaction to IV iron and does not tolerate oral formulations either   Losartan Swelling   Oxycodone Other (See Comments)   "Made me act out of my mind" Mental status changes- hallucinations   Latex    Added based on information entered  during case entry, please review and add reactions, type, and severity as needed   Trelegy Ellipta [fluticasone-umeclidin-vilant]    "Blood clots in throat"        Medication List     TAKE these medications    Accu-Chek Guide w/Device Kit   Accu-Chek Softclix Lancets lancets   acetaminophen 500 MG tablet Commonly known as: TYLENOL Take 1 tablet (500 mg total) by mouth every 6 (six) hours as needed for mild pain or headache. What changed:  how much to take when to take this reasons to take this   amLODipine 2.5 MG tablet Commonly known as: NORVASC TAKE 1 TABLET EVERY DAY   atorvastatin 40 MG tablet Commonly known as: LIPITOR Take 40 mg by mouth daily at 6 PM.   B-12 1000 MCG Tabs Take 2,000 mcg by mouth in the morning.   B-D SINGLE USE SWABS REGULAR Pads   clopidogrel 75 MG tablet Commonly known as: PLAVIX Take 1 tablet (75 mg total) by mouth daily with breakfast.   diazepam 2 MG tablet Commonly known as: VALIUM Take 2 mg by mouth daily as needed for anxiety.   Droplet Insulin Syringe 31G X 5/16" 1 ML Misc Generic drug: Insulin Syringe-Needle U-100   Eliquis 2.5 MG Tabs tablet Generic drug: apixaban Take 2.5 mg by mouth 2 (two) times daily.   folic acid 800 MCG tablet Commonly known as: FOLVITE Take 400 mcg by mouth at bedtime.   insulin glargine 100 UNIT/ML injection Commonly known as: LANTUS Inject 30 Units into the skin 2 (two) times daily.   ipratropium-albuterol 0.5-2.5 (3) MG/3ML Soln Commonly known as: DUONEB Take 3 mLs by nebulization every 6 (six) hours as needed (wheezing/shortness of breath).   isosorbide mononitrate 60 MG 24 hr tablet Commonly known as: IMDUR TAKE 1  TABLET TWICE DAILY (DOSE INCREASE 10/26/21)   nitroGLYCERIN 0.4 MG SL tablet Commonly known as: NITROSTAT PLACE 1 TABLET (0.4 MG TOTAL) UNDER THE TONGUE EVERY 5 (FIVE) MINUTES AS NEEDED FOR CHEST PAIN.   OXYGEN Inhale 2 L/min into the lungs See admin instructions. 2 L/min of oxygen at bedtime and during the day as needed for shortness of breath   pantoprazole 40 MG tablet Commonly known as: PROTONIX Take 40 mg by mouth 2 (two) times daily.   potassium chloride SA 20 MEQ tablet Commonly known as: KLOR-CON M Take 1 tablet (20 mEq total) by mouth 2 (two) times daily.   psyllium 0.52 g capsule Commonly known as: REGULOID Take 0.52 g by mouth 2 (two) times daily.   Spiriva HandiHaler 18 MCG inhalation capsule Generic drug: tiotropium Place 1 capsule into inhaler and inhale daily after breakfast.   torsemide 20 MG tablet Commonly known as: DEMADEX TAKE 1 TABLET TWICE DAILY   traZODone 50 MG tablet Commonly known as: DESYREL Take 100 mg by mouth at bedtime.   True Metrix Blood Glucose Test test strip Generic drug: glucose blood   Vitamin D3 50 MCG (2000 UT) Tabs Take 2,000 Units by mouth every evening.          Outstanding Labs/Studies   none  Duration of Discharge Encounter   Greater than 30 minutes including physician time.  SignedCline Crock, PA-C 02/09/2023, 10:13 AM [phone]   Patient seen, examined. Available data reviewed. Agree with findings, assessment, and plan as outlined by Carlean Jews, PA-C.  Telemetry is reviewed and shows atrial fibrillation with no pauses and no other arrhythmia.  On exam, he is a pleasant elderly male in  no distress.  Heart is irregularly irregular with no murmur gallop, lungs are clear, abdomen soft and nontender, bilateral groin sites are clear with no hematoma or ecchymosis, there is no lower extremity edema.  The patient's neurologic status is completely normalized both on yesterday and today's exam.  His postoperative  change in neurologic status was probably just related to his advanced age, comorbidities, and the use of deep conscious sedation.  I would characterize this as acute toxic encephalopathy due to anesthetic agents of fentanyl and Precedex.  He does not appear to suffered any kind of periprocedural stroke event.  He is doing well and will continue on the medical program outlined above.  I reviewed his outpatient follow-up and we discussed his post-TAVR restrictions.  His wife was present for the conversation and the patient is medically stable for discharge today.  Mike Wright, M.D. 02/09/2023 11:01 AM

## 2023-02-07 NOTE — Discharge Instructions (Signed)
ACTIVITY AND EXERCISE  Daily activity and exercise are an important part of your recovery. People recover at different rates depending on their general health and type of valve procedure.  Most people recovering from TAVR feel better relatively quickly   No lifting, pushing, pulling more than 10 pounds (examples to avoid: groceries, vacuuming, gardening, golfing):             - For one week with a procedure through the groin.             - For six weeks for procedures through the chest wall or neck. NOTE: You will typically see one of our providers 7-14 days after your procedure to discuss WHEN TO RESUME the above activities.      DRIVING  Do not drive until you are seen for follow up and cleared by a provider. Generally, we ask patient to not drive for 1 week after their procedure.  If you have been told by your doctor in the past that you may not drive, you must talk with him/her before you begin driving again.   DRESSING  Groin site: you may leave the clear dressing over the site for up to one week or until it falls off.   HYGIENE  If you had a femoral (leg) procedure, you may take a shower when you return home. After the shower, pat the site dry. Do NOT use powder, oils or lotions in your groin area until the site has completely healed.  If you had a chest procedure, you may shower when you return home unless specifically instructed not to by your discharging practitioner.             - DO NOT scrub incision; pat dry with a towel.             - DO NOT apply any lotions, oils, powders to the incision.             - No tub baths / swimming for at least 2 weeks.  If you notice any fevers, chills, increased pain, swelling, bleeding or pus, please contact your doctor.   ADDITIONAL INFORMATION  If you are going to have an upcoming dental procedure, please contact our office as you will require antibiotics ahead of time to prevent infection on your heart valve.    If you have any questions  or concerns you can call the structural heart phone during normal business hours 8am-4pm. If you have an urgent need after hours or weekends please call [phone] to talk to the on call provider for general cardiology. If you have an emergency that requires immediate attention, please call 911.    After TAVR Checklist  Check  Test Description   Follow up appointment in 1-2 weeks  You will see our structural heart advanced practice provider. Your incision sites will be checked and you will be cleared to drive and resume all normal activities if you are doing well.     1 month echo and follow up  You will have an echo to check on your new heart valve and be seen back in the office by a structural heart advanced practice provider.   Follow up with your primary cardiologist You will need to be seen by your primary cardiologist in the following 3-6 months after your 1 month appointment in the valve clinic.    1 year echo and follow up You will have another echo to check on your heart valve after 1 year   and be seen back in the office by a structural heart advanced practice provider. This your last structural heart visit.   Bacterial endocarditis prophylaxis  You will have to take antibiotics for the rest of your life before all dental procedures (even teeth cleanings) to protect your heart valve. Antibiotics are also required before some surgeries. Please check with your cardiologist before scheduling any surgeries. Also, please make sure to tell us if you have a penicillin allergy as you will require an alternative antibiotic.   ' =======================================  Information on my medicine - ELIQUIS (apixaban)  This medication education was reviewed with me or my healthcare representative as part of my discharge preparation.     Why was Eliquis prescribed for you? Eliquis was prescribed for you to reduce the risk of a blood clot forming that can cause a stroke if you have a medical  condition called atrial fibrillation (a type of irregular heartbeat).  What do You need to know about Eliquis ? Take your Eliquis TWICE DAILY - one tablet in the morning and one tablet in the evening with or without food. If you have difficulty swallowing the tablet whole please discuss with your pharmacist how to take the medication safely.  Take Eliquis exactly as prescribed by your doctor and DO NOT stop taking Eliquis without talking to the doctor who prescribed the medication.  Stopping may increase your risk of developing a stroke.  Refill your prescription before you run out.  After discharge, you should have regular check-up appointments with your healthcare provider that is prescribing your Eliquis.  In the future your dose may need to be changed if your kidney function or weight changes by a significant amount or as you get older.  What do you do if you miss a dose? If you miss a dose, take it as soon as you remember on the same day and resume taking twice daily.  Do not take more than one dose of ELIQUIS at the same time to make up a missed dose.  Important Safety Information A possible side effect of Eliquis is bleeding. You should call your healthcare provider right away if you experience any of the following: Bleeding from an injury or your nose that does not stop. Unusual colored urine (red or dark brown) or unusual colored stools (red or black). Unusual bruising for unknown reasons. A serious fall or if you hit your head (even if there is no bleeding).  Some medicines may interact with Eliquis and might increase your risk of bleeding or clotting while on Eliquis. To help avoid this, consult your healthcare provider or pharmacist prior to using any new prescription or non-prescription medications, including herbals, vitamins, non-steroidal anti-inflammatory drugs (NSAIDs) and supplements.  This website has more information on Eliquis (apixaban):  http://www.eliquis.com/eliquis/home

## 2023-02-07 NOTE — Interval H&P Note (Signed)
History and Physical Interval Note:  02/07/2023 10:57 AM  Mike Wright  has presented today for surgery, with the diagnosis of Severe Aortic Stenosis.  The various methods of treatment have been discussed with the patient and family. After consideration of risks, benefits and other options for treatment, the patient has consented to  Procedure(s): Transcatheter Aortic Valve Replacement, Transfemoral (N/A) INTRAOPERATIVE TRANSTHORACIC ECHOCARDIOGRAM (N/A) as a surgical intervention.  The patient's history has been reviewed, patient examined, no change in status, stable for surgery.  I have reviewed the patient's chart and labs.  Questions were answered to the patient's satisfaction.     Alleen Borne

## 2023-02-07 NOTE — Progress Notes (Signed)
  Echocardiogram 2D Echocardiogram has been performed.  Janalyn Harder 02/07/2023, 12:30 PM

## 2023-02-07 NOTE — Progress Notes (Addendum)
Pt arrived in cath lab holding for anesthesia recovery and sheath pull at 1246. Transfer of care from cath lab 7 RT Thereasa Distance and [employer]. Initial assessment finds patient awake but non-responsive to commands and expressively aphasic. No response to painful stimuli; body atonic at this time. BG 116.  Lab 7 staff called at 1255 to verify pt baseline. Carlean Jews PA notified by phone at 1300. Anesthesia notified by phone at 1302. Code Stroke activated at 1305. Cath Lab supervisors aware.

## 2023-02-07 NOTE — Progress Notes (Signed)
Pt reports he took one dose of nitroglycerin this past Saturday for an episode of throbbing chest pain, central chest. Denies any chest pain today, Dr. Nance Pew aware.

## 2023-02-07 NOTE — Progress Notes (Signed)
EEG complete - results pending 

## 2023-02-07 NOTE — Consult Note (Signed)
NEURO HOSPITALIST CONSULT NOTE   Requesting physician: Dr. Excell Seltzer  Reason for Consult: [facility] and not following commands following cardiac catheterization  History obtained from:  Cardiology RN and Chart     HPI:                                                                                                                                          Mike Wright is an 89 y.o. male with an extensive PMHx as documented below, including CAD s/p coronary angioplasty with stent placement in 2019, chronic atrial fibrillation (on Eliquis at home), chronic diastolic CHF, aortic stenosis, COPD, DM2, status post bilateral transmetatarsal amputations, HTN, HIT, PAD, who was admitted to the hospital yesterday for management of symptomatic aortic stenosis. He underwent TAVR today. LKN was 1107 prior to being administered Precedex and fentanyl sedation for the procedure. After the procedure was completed, sedation was discontinued and he awakened, but he was was not able to follow any commands or answer any questions. Also with no spontaneous movements after the procedure other than eye opening and ability to blink, with a catatonic-like appearance. He did thrash his arms antigravity briefly when eliciting gag reflex with a nasal swab, with no obvious asymmetry, but remained verbally unresponsive. Code Stroke was then called.   He received bivalirudin (angiostat, a direct thrombin inhibitor) for the TAVR procedure. This agent has a half life of 25 minutes in patients with normal renal function. INR currently 1.7.   Past Medical History:  Diagnosis Date   Anemia    a. mild, noted 04/2017.   CAD in native artery    a. [place] 04/2017 s/p DES to D1, DES to prox-mid LAD, DES to prox LAD overlapping the prior stent, LVEF 55-65%.    Chronic a-fib (HCC)    Chronic diastolic CHF (congestive heart failure) (HCC)    Constipation    COPD (chronic obstructive pulmonary disease) (HCC)    Diabetic ulcer of  toe (HCC)    DJD (degenerative joint disease) of cervical spine    Essential hypertension    GERD (gastroesophageal reflux disease)    History of hiatal hernia    HIT (heparin-induced thrombocytopenia) (HCC)    Hypothyroidism    Hypoxia    a. went home on home O2 04/2017.   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   Retinitis    Sinus bradycardia    a. HR 30s-40s in 04/2017 -> diltiazem stopped, metoprolol reduced.   Sleep apnea    "chose not to order CPAP at this time" (05/18/2017)   Type 2 diabetes mellitus (HCC)    Wears glasses  Past Surgical History:  Procedure Laterality Date   ABDOMINAL AORTOGRAM W/LOWER EXTREMITY N/A 09/15/2017   Procedure: ABDOMINAL AORTOGRAM W/LOWER EXTREMITY;  Surgeon: Nada Libman, MD;  [facility]: [facility];  Service: Cardiovascular;  Laterality: N/A;   ABDOMINAL AORTOGRAM W/LOWER EXTREMITY N/A 02/04/2020   Procedure: ABDOMINAL AORTOGRAM W/LOWER EXTREMITY;  Surgeon: Nada Libman, MD;  Location: [facility];  Service: Cardiovascular;  Laterality: N/A;   ABDOMINAL AORTOGRAM W/LOWER EXTREMITY N/A 12/15/2020   Procedure: ABDOMINAL AORTOGRAM W/LOWER EXTREMITY;  Surgeon: Nada Libman, MD;  Location: [facility];  Service: Cardiovascular;  Laterality: N/A;   AMPUTATION Bilateral 09/20/2017   Procedure: BILATERAL GREAT TOE AMPUTATIONS INCLUDING METATARSAL HEADS;  Surgeon: Nada Libman, MD;  Location: [facility] OR;  Service: Vascular;  Laterality: Bilateral;   AMPUTATION Right 03/01/2019   Procedure: AMPUTATION TOES;  Surgeon: Chuck Hint, MD;  Location: [facility] OR;  Service: Vascular;  Laterality: Right;   AMPUTATION Right 05/29/2019   Procedure: AMPUTATION RIGHT THIRD TOE;  Surgeon: Nada Libman, MD;  Location: [facility] OR;  Service: Vascular;  Laterality: Right;   AMPUTATION Right 09/26/2019   Procedure: AMPUTATION RIGHT FOURTH  TOE AND FIFTH TOES;  Surgeon: Nada Libman, MD;  Location: [facility] OR;  Service: Vascular;  Laterality: Right;   ANGIOPLASTY Right 09/20/2017   Procedure: ANGIOPLASTY RIGHT TIBIAL ARTERY;  Surgeon: Nada Libman, MD;  Location: [facility] OR;  Service: Vascular;  Laterality: Right;   APPENDECTOMY     APPLICATION OF WOUND VAC  12/17/2020   Procedure: APPLICATION OF WOUND VAC;  Surgeon: Nada Libman, MD;  Location: [facility] OR;  Service: Vascular;;   BACK SURGERY     CARDIAC CATHETERIZATION  1980s; 2012;   CATARACT EXTRACTION Left 2004   COLONOSCOPY  2004   FLEISHMAN TICS   COLONOSCOPY WITH PROPOFOL N/A 11/28/2018   Procedure: COLONOSCOPY WITH PROPOFOL;  Surgeon: Malissa Hippo, MD;  [facility]: [facility];  Service: Endoscopy;  Laterality: N/A;   CORONARY ANGIOPLASTY WITH STENT PLACEMENT  05/18/2017   "3 stents"   CORONARY STENT INTERVENTION N/A 05/18/2017   Procedure: CORONARY STENT INTERVENTION;  Surgeon: Corky Crafts, MD;  Location: [facility] INVASIVE CV LAB;  Service: Cardiovascular;  Laterality: N/A;   ESOPHAGOGASTRODUODENOSCOPY (EGD) WITH PROPOFOL N/A 11/20/2017   Procedure: ESOPHAGOGASTRODUODENOSCOPY (EGD) WITH PROPOFOL;  Surgeon: Hilarie Fredrickson, MD;  Location: [facility] ENDOSCOPY;  Service: Gastroenterology;  Laterality: N/A;   ESOPHAGOGASTRODUODENOSCOPY (EGD) WITH PROPOFOL N/A 11/28/2018   Procedure: ESOPHAGOGASTRODUODENOSCOPY (EGD) WITH PROPOFOL;  Surgeon: Malissa Hippo, MD;  [facility];  Service: Endoscopy;  Laterality: N/A;   ESOPHAGOGASTRODUODENOSCOPY (EGD) WITH PROPOFOL N/A 07/12/2019   Procedure: ESOPHAGOGASTRODUODENOSCOPY (EGD) WITH PROPOFOL;  Surgeon: Malissa Hippo, MD;  [facility];  Service: Endoscopy;  Laterality: N/A;  125   GIVENS CAPSULE STUDY N/A 12/17/2018   Procedure: GIVENS CAPSULE STUDY;  Surgeon: Malissa Hippo, MD;  [facility];  Service: Endoscopy;  Laterality: N/A;  7:30am   I & D EXTREMITY Right 12/17/2017   Procedure: IRRIGATION AND  DEBRIDEMENT RIGHT GREAT TOE;  Surgeon: Nada Libman, MD;  [facility]: [facility] OR;  Service: Vascular;  Laterality: Right;   INCISION AND DRAINAGE Right 12/17/2020   Procedure: INCISION AND DRAINAGE OF RIGHT FOOT;  Surgeon: Nada Libman, MD;  [facility]: [facility];  Service: Vascular;  Laterality: Right;   JOINT REPLACEMENT     LEFT HEART CATH AND CORONARY ANGIOGRAPHY N/A 05/18/2017   Procedure:  LEFT HEART CATH AND CORONARY ANGIOGRAPHY;  Surgeon: Corky Crafts, MD;  Location: [facility] INVASIVE CV LAB;  Service: Cardiovascular;  Laterality: N/A;   LOWER EXTREMITY ANGIOGRAM Right 09/20/2017   Procedure: RIGHT LOWER LEG  ANGIOGRAM;  Surgeon: Nada Libman, MD;  Location: [facility];  Service: Vascular;  Laterality: Right;   LUMBAR FUSION  2002   L3, 4 L4, 5 L5 S1 Fused by Dr. Trey Sailors   PERIPHERAL VASCULAR BALLOON ANGIOPLASTY Left 09/15/2017   Procedure: PERIPHERAL VASCULAR BALLOON ANGIOPLASTY;  Surgeon: Nada Libman, MD;  [facility];  Service: Cardiovascular;  Laterality: Left;  PTA of Peroneal & Posterior Tibial   PERIPHERAL VASCULAR INTERVENTION Right 12/15/2020   Procedure: PERIPHERAL VASCULAR INTERVENTION;  Surgeon: Nada Libman, MD;  [facility];  Service: Cardiovascular;  Laterality: Right;  SFA   POLYPECTOMY  11/28/2018   Procedure: POLYPECTOMY;  Surgeon: Malissa Hippo, MD;  [facility];  Service: Endoscopy;;  duodenum   POSTERIOR LUMBAR FUSION     RHINOPLASTY     RIGHT HEART CATH N/A 05/18/2017   Procedure: RIGHT HEART CATH;  Surgeon: Corky Crafts, MD;  Location: [facility] INVASIVE CV LAB;  Service: Cardiovascular;  Laterality: N/A;   RIGHT/LEFT HEART CATH AND CORONARY ANGIOGRAPHY N/A 12/16/2022   Procedure: RIGHT/LEFT HEART CATH AND CORONARY ANGIOGRAPHY;  Surgeon: Tonny Bollman, MD;  Location: [facility] INVASIVE CV LAB;  Service: Cardiovascular;  Laterality: N/A;   TEE WITHOUT CARDIOVERSION N/A 09/18/2017   Procedure: TRANSESOPHAGEAL ECHOCARDIOGRAM  (TEE);  Surgeon: Pricilla Riffle, MD;  Location: [facility] ENDOSCOPY;  Service: Cardiovascular;  Laterality: N/A;   TOTAL KNEE ARTHROPLASTY Bilateral    TRANSMETATARSAL AMPUTATION Right 02/04/2020   Procedure: RIGHT TRANSMETATARSAL AMPUTATION;  Surgeon: Nada Libman, MD;  Location: [facility] OR;  Service: Vascular;  Laterality: Right;   TRANSMETATARSAL AMPUTATION Left 11/12/2021   Procedure: LEFT TRANSMETATARSAL AMPUTATION;  Surgeon: Maeola Harman, MD;  Location: [facility] OR;  Service: Vascular;  Laterality: Left;   TRANSURETHRAL RESECTION OF PROSTATE  2001   Krishnan    Family History  Problem Relation Age of Onset   Heart attack Father 74   COPD Father    COPD Mother    Heart disease Mother    Diabetes Mother    Hypertension Sister    CVA Sister    Diabetes Sister    Multiple sclerosis Sister              Social History:  reports that he quit smoking about 39 years ago. His smoking use included cigarettes. He started smoking about 69 years ago. He has a 60 pack-year smoking history. He has never used smokeless tobacco. He reports that he does not currently use alcohol. He reports that he does not use drugs.  Allergies  Allergen Reactions   Codeine Shortness Of Breath   Doxycycline Swelling    Swelling and numbness in lips and face. Swelling improved after stopping. Reports still experiences numbness in bottom lip.    Feraheme [Ferumoxytol] Other (See Comments)    Diaphoretic, chest pain   Heparin Other (See Comments)    +HIT,  Severe bleeding (with heparin drip & large doses), tolerates low doses   Iron Shortness Of Breath    Patient severe reaction to IV iron and does not tolerate oral formulations either   Losartan Swelling   Oxycodone Other (See Comments)    "Made me act out of my mind" Mental status changes- hallucinations  Latex     Added based on information entered during case entry, please review and add reactions, type, and severity as needed   Trelegy Ellipta  [Fluticasone-Umeclidin-Vilant]     "Blood clots in throat"    MEDICATIONS:                                                                                                                     Scheduled:  chlorhexidine  30 mL Topical UD   chlorhexidine  60 mL Topical Once   magnesium sulfate  40 mEq Other To OR   potassium chloride  80 mEq Other To OR   Continuous:  sodium chloride 40 mL/hr at 02/07/23 1107   sodium chloride     norepinephrine (LEVOPHED) Adult infusion       ROS:                                                                                                                                       Unable to obtain due to awake unresponsive state.    Blood pressure (!) 142/75, pulse (!) 49, temperature 97.9 F (36.6 C), temperature source Temporal, resp. rate 14, height 5\' 7"  (1.702 m), weight 77.4 kg, SpO2 97%.   General Examination:                                                                                                       Physical Exam  HEENT:  [place]/AT. No nuchal rigidity.  Lungs: Respirations unlabored Extremities: Bilateral transmetatarsal amputations noted.   Neurological Examination Mental Status: Awake with eyes open but no responses to questions, commands or noxious stimuli. No spontaneous vocalizations. Does not grimace to noxious. Limbs fall back to bed after passive elevation and release, with minimal effort against gravity.  Cranial Nerves: II: Blinks to threat in temporal visual fields bilaterally. PERRL.    III,IV, VI: No ptosis. Eyes are midline and conjugate. Does not gaze towards or away from visual stimuli. No nystagmus. Doll's eye  reflex is suppressed, eyes remaining midline during oculocephalic maneuver.  V: Blinks to eyelid stimulation bilaterally.  VII: No facial asymmetry at rest. Tone of facial musculature appears normal. Grimaces in response to ammonia swab after a 20-30 second delay.  VIII: No response to voice IX,X: No vocal  output. Gag reflex intact.  XI: Head is midline.  XII: Unable to assess Motor/Sensory: Thrashes BUE antigravity as well as BLE in response to ammonia swab held below nares, after a 20-30 second delay.  Weakly moves BLE to noxious pinch, approximately 1 cm of limb excursion during low amplitude squirming movements to pinch, with approximately 10 cm of higher amplitude excursions in response to ammonia swab held to nares. No asymmetry observed with the above responses.   Deep Tendon Reflexes: 1+ and symmetric throughout, but with 0 achilles reflexes. Unable to elicit Babinski sign due to bilateral transmetatarsal amputations.  Cerebellar: Unable to assess Gait: Unable to assess  NIHSS: 21    Lab Results: Basic Metabolic Panel: Recent Labs  Lab 02/03/23 1015 02/03/23 1123 02/07/23 1231  NA 141 138 139  K 4.1 4.1 3.4*  CL 103 100 99  CO2 29 29  --   GLUCOSE 104* 85 133*  BUN 26* 28* 28*  CREATININE 1.30* 1.29* 1.30*  CALCIUM 9.2 8.9  --     CBC: Recent Labs  Lab 02/03/23 1015 02/03/23 1123 02/07/23 1231  WBC 6.0 5.9  --   NEUTROABS  --  3.7  --   HGB 12.5* 12.3* 11.9*  HCT 37.1* 37.3* 35.0*  MCV 88.3 89.9  --   PLT 110* 114*  --     Cardiac Enzymes: No results for input(s): "CKTOTAL", "CKMB", "CKMBINDEX", "TROPONINI" in the last 168 hours.  Lipid Panel: No results for input(s): "CHOL", "TRIG", "HDL", "CHOLHDL", "VLDL", "LDLCALC" in the last 168 hours.  Imaging: ECHOCARDIOGRAM LIMITED  Result Date: 02/07/2023    ECHOCARDIOGRAM LIMITED REPORT   Patient Name:   Mike Wright Date of Exam: 02/07/2023 Medical Rec #:  [id number]       Height:       67.0 in Accession #:    [id number]      Weight:       170.6 lb Date of Birth:  [birth year]       BSA:          1.890 m Patient Age:    89 years        BP:           114/59 mmHg Patient Gender: M               HR:           51 bpm. Exam Location:  Inpatient Procedure: Limited Echo, Cardiac Doppler and Color Doppler Indications:      I35.0 Nonrheumatic aortic (valve) stenosis  History:         Patient has prior history of Echocardiogram examinations, most                  recent 07/19/2022. CAD, Abnormal ECG, Aortic Valve Disease,                  Arrythmias:Atrial Flutter, Signs/Symptoms:Dyspnea and Shortness                  of Breath; Risk Factors:Hypertension and Diabetes. Aortic                  stenosis.  Aortic Valve: 26 mm Edwards Sapien prosthetic, stented (TAVR)                  valve is present in the aortic position. Procedure Date:                  02/07/2023.  Sonographer:     Sheralyn Boatman RDCS Referring Phys:  [id number] MICHAEL COOPER Diagnosing Phys: Charlton Haws MD IMPRESSIONS  1. Left ventricular ejection fraction, by estimation, is 60 to 65%. The left ventricle has normal function. There is mild left ventricular hypertrophy.  2. Right ventricular systolic function is low normal.  3. The mitral valve is degenerative. Mild mitral valve regurgitation. Moderate mitral annular calcification.  4. The aortic valve has been repaired/replaced. There is a 26 mm Edwards Sapien prosthetic (TAVR) valve present in the aortic position. Procedure Date: 02/07/2023. Echo findings are consistent with trivial perivalvular leak of the aortic prosthesis.  5. Aortic dilatation noted. There is mild dilatation of the ascending aorta, measuring 39 mm. FINDINGS  Left Ventricle: Left ventricular ejection fraction, by estimation, is 60 to 65%. The left ventricle has normal function. There is mild left ventricular hypertrophy. Right Ventricle: Right ventricular systolic function is low normal. Pericardium: There is no evidence of pericardial effusion. Mitral Valve: The mitral valve is degenerative in appearance. There is moderate thickening of the mitral valve leaflet(s). There is moderate calcification of the mitral valve leaflet(s). Moderate mitral annular calcification. Mild mitral valve regurgitation. Tricuspid Valve: Tricuspid valve  regurgitation is mild. Aortic Valve: Pre TAVR: tri leaflet valve with calcium and restrictied leaflet motion Mild/moderate AR severe AS with mean gradient 27 peak 48 mmHg supine in lab. AVA 0.87 cm2 Post TAVR: well positioned 26 mm Sapien 3 valve. Trivial PVL seen at 5:00 on PSSA views. mean gradient 5 peak 9 mmHg AVA 3.3 cm2. The aortic valve has been repaired/replaced. Aortic valve mean gradient measures 5.0 mmHg. Aortic valve peak gradient measures 9.0 mmHg. Aortic valve area, by VTI measures 3.29 cm. There is a 26 mm Edwards Sapien prosthetic, stented (TAVR) valve present in the aortic position. Procedure Date: 02/07/2023. Echo findings are consistent with normal structure and function of the aortic valve prosthesis. Aorta: Aortic dilatation noted. There is mild dilatation of the ascending aorta, measuring 39 mm. Additional Comments: Spectral Doppler performed. Color Doppler performed.  LEFT VENTRICLE PLAX 2D LVIDd:         4.90 cm LVIDs:         3.00 cm LV PW:         1.10 cm LV IVS:        1.20 cm LVOT diam:     2.30 cm LV SV:         113 LV SV Index:   60 LVOT Area:     4.15 cm  AORTIC VALVE AV Area (Vmax):    3.49 cm AV Area (Vmean):   3.32 cm AV Area (VTI):     3.29 cm AV Vmax:           150.00 cm/s AV Vmean:          96.500 cm/s AV VTI:            0.345 m AV Peak Grad:      9.0 mmHg AV Mean Grad:      5.0 mmHg LVOT Vmax:         126.00 cm/s LVOT Vmean:        77.100 cm/s LVOT VTI:  0.273 m LVOT/AV VTI ratio: 0.79  AORTA Ao Asc diam: 3.90 cm  SHUNTS Systemic VTI:  0.27 m Systemic Diam: 2.30 cm Charlton Haws MD Electronically signed by Charlton Haws MD Signature Date/Time: 02/07/2023/12:48:04 PM    Final    Structural Heart Procedure  Result Date: 02/07/2023 See surgical note for result.     Assessment: 89 y.o. male with an extensive PMHx as documented below, including CAD s/p coronary angioplasty with stent placement in 2019, chronic atrial fibrillation (on Eliquis at home), chronic  diastolic CHF, aortic stenosis, COPD, DM2, status post bilateral transmetatarsal amputations, HTN, HIT, PAD, who was admitted to the hospital yesterday for management of symptomatic aortic stenosis. He underwent TAVR today. LKN was 1107 prior to being administered Precedex and fentanyl sedation for the procedure. After the procedure was completed, sedation was discontinued and he awakened, but he was was not able to follow any commands or answer any questions. Also with no spontaneous movements after the procedure other than eye opening and ability to blink, with a catatonic-like appearance. He did thrash his arms antigravity briefly when eliciting gag reflex with a nasal swab, with no obvious asymmetry, but remained verbally unresponsive. Code Stroke was then called. He received bivalirudin (angiostat, a direct thrombin inhibitor) for the TAVR procedure. This agent has a half life of 25 minutes in patients with normal renal function. INR currently 1.7.  - Exam reveals findings most consistent with catatonia. A focal lesional aphasia is felt to be unlikely.  - TTE performed today after TAVR procedure: Left ventricular ejection fraction, by estimation, is 60 to 65%. The left ventricle has normal function. There is mild left ventricular hypertrophy. Right ventricular systolic function is low normal. The mitral valve is degenerative. Mild mitral valve regurgitation. Moderate mitral annular calcification. The aortic valve has been repaired/replaced. There is a 26 mm Edwards Sapien prosthetic (TAVR) valve present in the aortic position. Echo findings are consistent with trivial perivalvular leak of the aortic prosthesis. Aortic dilatation noted. There is mild dilatation of the ascending aorta, measuring 39 mm.  - CT head: No acute intracranial hemorrhage or evidence of acute large vessel territory infarct. ASPECT score is 10. Mild chronic small-vessel disease. - CTA of head and neck: No LVO. There is a possible small  nonocclusive thrombus in the distal left vertebral artery. This may be due to perioperative distal embolization in the context of today's TAVR procedure. Alternatively, it could be a false-positive secondary to artifact. Images personally reviewed with Radiology.  - STAT MRI brain: Prematurely terminated examination consisting of diffusion-weighted imaging and a severely motion degraded axial T2 FLAIR sequence. No evidence of an acute infarct. Incompletely assessed cerebral white matter chronic small vessel ischemic disease. - Overall impression: Presentation is most consistent with catatonia, possibly as a paradoxical effect or due to slow recovery from the sedation that was used for his procedure. No jerking, twitching or other seizure-like activity to suggest an epileptic phenomenon, but will need to further assess with EEG.     Recommendations: - Restart Eliquis with timing to be determined by Cardiology in the context of recent bivalirudin administration for his TAVR procedure  - Consider re-imaging with CTA at an appropriate time interval to assess for resolution of the possible distal left vertebral artery thrombus seen on CTA. Will defer to Stroke Team for timing of a possible follow up scan. The apparent finding is likely too small for MRA to be of utility as a follow up imaging modality.  - BP  management per Cardiology.  - EEG (ordered) - Frequent neuro checks  Electronically signed: Dr. Caryl Pina 02/07/2023, 1:35 PM

## 2023-02-07 NOTE — Code Documentation (Signed)
Mike Wright is an 89 yr old male inpatient for TAVR procedure. He has a history of PVD, amputations, HF and aortic stenosis. Pt was LKW today before his procedure at 1107. After the TAVR procedure, he was able to open his eyes, but could not speak or follow commands, moving only to noxious stimuli. Pt received Angiomax intraoperatively. He takes Eliquis which has been on hold for the procedure.    Pt met in Cath Lab holding area by Stroke Team. Pt NIHSS 21, as above.  CBG and BP WNL. Pt to CT with team. The following imaging was obtained: CT, CTA/P. Per Dr Otelia Limes, CT negative for acute hemorrhage. CT negative for LVO. Pt still within window for Thrombolytric. Pt to Stat MRI for treatment decision making. Pt unable to be still in MRI, study aborted. Per Dr. Otelia Limes, no stroke seen in available images. As pt is improving, PT 20, INR 1.7, Dr. Otelia Limes ordered TNK not to be given. Pt ineligible for thrombectomy as no LVO per Dr. Otelia Limes.  He will need to be monitored closely. He will need q 30 min NIHSS and VS until OOW for TNK, at 1537, then q 2 hr for 12 hours, then q 4. Bedside handoff with Lauren RN in Cath Lab complete.

## 2023-02-07 NOTE — Anesthesia Preprocedure Evaluation (Addendum)
Anesthesia Evaluation  Patient identified by MRN, date of birth, ID band Patient awake    Reviewed: Allergy & Precautions, NPO status , Patient's Chart, lab work & pertinent test results  Airway Mallampati: II  TM Distance: >3 FB Neck ROM: Full    Dental no notable dental hx. (+) Edentulous Upper, Poor Dentition, Missing   Pulmonary sleep apnea , COPD, former smoker   Pulmonary exam normal        Cardiovascular hypertension, Pt. on medications + CAD, + Peripheral Vascular Disease and +CHF  + dysrhythmias Atrial Fibrillation + Valvular Problems/Murmurs AS  Rhythm:Regular Rate:Normal + Systolic murmurs IMPRESSIONS   1. Left ventricular ejection fraction, by estimation, is 70 to 75%. The  left ventricle has hyperdynamic function. The left ventricle has no  regional wall motion abnormalities. There is mild left ventricular  hypertrophy. Left ventricular diastolic  function could not be evaluated due to atrial flutter.   2. Right ventricular systolic function is normal. The right ventricular  size is normal. There is severely elevated pulmonary artery systolic  pressure. The estimated right ventricular systolic pressure is 73.1 mmHg.   3. Left atrial size was severely dilated.   4. Right atrial size was severely dilated.   5. The mitral valve is abnormal. No evidence of mitral valve  regurgitation. No evidence of mitral stenosis. Severe mitral annular  calcification.   6. The tricuspid valve is abnormal. Tricuspid valve regurgitation is mild  to moderate.   7. The aortic valve is calcified. Aortic valve regurgitation is mild.  Moderate to severe aortic valve stenosis. Aortic valve area, by VTI  measures 1.13 cm. Aortic valve mean gradient measures 33.0 mmHg. Aortic  valve Vmax measures 4.03 m/s. AV DVI 0.33.   8. The inferior vena cava is dilated in size with <50% respiratory  variability, suggesting right atrial pressure of 15  mmHg.   9. Increased flow velocities may be secondary to anemia, thyrotoxicosis,  hyperdynamic or high flow state.   Comparison(s): Changes from prior study are noted. AS worsened from  moderate (DVI 0.33, Vmax 3.46 m/sec, mean PG 25 mm Hg) in 10/2021 to  moderate-severe (DVI 0.33, Vmax 4.03 m/sec, mean PG 33 mm Hg) now.     Neuro/Psych  negative psych ROS   GI/Hepatic Neg liver ROS, hiatal hernia,GERD  Medicated,,  Endo/Other  diabetes, Type 2, Oral Hypoglycemic AgentsHypothyroidism    Renal/GU Renal disease     Musculoskeletal  (+) Arthritis , Osteoarthritis,    Abdominal Normal abdominal exam  (+)   Peds  Hematology  (+) Blood dyscrasia, anemia HIT, plan for angiomax    Anesthesia Other Findings   Reproductive/Obstetrics                             Anesthesia Physical Anesthesia Plan  ASA: 4  Anesthesia Plan: MAC   Post-op Pain Management:    Induction: Intravenous  PONV Risk Score and Plan: 1 and Propofol infusion and Treatment may vary due to age or medical condition  Airway Management Planned: Simple Face Mask and Nasal Cannula  Additional Equipment: Arterial line  Intra-op Plan:   Post-operative Plan:   Informed Consent: I have reviewed the patients History and Physical, chart, labs and discussed the procedure including the risks, benefits and alternatives for the proposed anesthesia with the patient or authorized representative who has indicated his/her understanding and acceptance.     Dental advisory given  Plan Discussed with: CRNA  Anesthesia Plan Comments:        Anesthesia Quick Evaluation

## 2023-02-07 NOTE — Progress Notes (Signed)
Patient arrived at the unit,CHG bath given,Bilateral groin site level 0.bilateral posterior tibialis pulse doopler,CCMD notified,vitals taken

## 2023-02-07 NOTE — Progress Notes (Signed)
Pt reports taking Nitroglycerin on 02/04/23 for chest pain. Pt with no current chest pain. Dr. Nance Pew and Dr Excell Seltzer notified prior to surgery.

## 2023-02-08 ENCOUNTER — Encounter (HOSPITAL_COMMUNITY): Payer: Self-pay | Admitting: Cardiovascular Disease

## 2023-02-08 ENCOUNTER — Inpatient Hospital Stay (HOSPITAL_COMMUNITY): Payer: Medicare HMO

## 2023-02-08 DIAGNOSIS — R4182 Altered mental status, unspecified: Secondary | ICD-10-CM

## 2023-02-08 DIAGNOSIS — I350 Nonrheumatic aortic (valve) stenosis: Secondary | ICD-10-CM | POA: Diagnosis not present

## 2023-02-08 DIAGNOSIS — Z952 Presence of prosthetic heart valve: Secondary | ICD-10-CM | POA: Diagnosis not present

## 2023-02-08 DIAGNOSIS — Z006 Encounter for examination for normal comparison and control in clinical research program: Secondary | ICD-10-CM | POA: Diagnosis not present

## 2023-02-08 DIAGNOSIS — R569 Unspecified convulsions: Secondary | ICD-10-CM

## 2023-02-08 DIAGNOSIS — F061 Catatonic disorder due to known physiological condition: Secondary | ICD-10-CM | POA: Diagnosis not present

## 2023-02-08 DIAGNOSIS — G928 Other toxic encephalopathy: Secondary | ICD-10-CM | POA: Diagnosis not present

## 2023-02-08 LAB — BASIC METABOLIC PANEL
Anion gap: 6 (ref 5–15)
BUN: 20 mg/dL (ref 8–23)
CO2: 28 mmol/L (ref 22–32)
Calcium: 8.8 mg/dL — ABNORMAL LOW (ref 8.9–10.3)
Chloride: 107 mmol/L (ref 98–111)
Creatinine, Ser: 1.45 mg/dL — ABNORMAL HIGH (ref 0.61–1.24)
GFR, Estimated: 47 mL/min — ABNORMAL LOW (ref >60)
Glucose, Bld: 127 mg/dL — ABNORMAL HIGH (ref 70–99)
Potassium: 4.2 mmol/L (ref 3.5–5.1)
Sodium: 141 mmol/L (ref 135–145)

## 2023-02-08 LAB — ECHOCARDIOGRAM COMPLETE
AR max vel: 2.4 cm2
AV Area VTI: 2.49 cm2
AV Area mean vel: 2.48 cm2
AV Mean grad: 9.5 mm[Hg]
AV Peak grad: 20 mm[Hg]
Ao pk vel: 2.24 m/s
Area-P 1/2: 3.88 cm2
Height: 67 in
S' Lateral: 2.6 cm
Weight: 2705.49 [oz_av]

## 2023-02-08 LAB — GLUCOSE, CAPILLARY
Glucose-Capillary: 178 mg/dL — ABNORMAL HIGH (ref 70–99)
Glucose-Capillary: 140 mg/dL — ABNORMAL HIGH (ref 70–99)
Glucose-Capillary: 186 mg/dL — ABNORMAL HIGH (ref 70–99)
Glucose-Capillary: 122 mg/dL — ABNORMAL HIGH (ref 70–99)

## 2023-02-08 LAB — CBC
HCT: 34.5 % — ABNORMAL LOW (ref 39.0–52.0)
Hemoglobin: 11.5 g/dL — ABNORMAL LOW (ref 13.0–17.0)
MCH: 29.5 pg (ref 26.0–34.0)
MCHC: 33.3 g/dL (ref 30.0–36.0)
MCV: 88.5 fL (ref 80.0–100.0)
Platelets: 92 10*3/uL — ABNORMAL LOW (ref 150–400)
RBC: 3.9 MIL/uL — ABNORMAL LOW (ref 4.22–5.81)
RDW: 16.2 % — ABNORMAL HIGH (ref 11.5–15.5)
WBC: 6.9 10*3/uL (ref 4.0–10.5)
nRBC: 0 % (ref 0.0–0.2)

## 2023-02-08 LAB — LIPID PANEL
Cholesterol: 81 mg/dL (ref 0–200)
HDL: 20 mg/dL — ABNORMAL LOW (ref >40)
LDL Cholesterol: 43 mg/dL (ref 0–99)
Total CHOL/HDL Ratio: 4.1 {ratio}
Triglycerides: 89 mg/dL (ref <150)
VLDL: 18 mg/dL (ref 0–40)

## 2023-02-08 LAB — MAGNESIUM: Magnesium: 1.8 mg/dL (ref 1.7–2.4)

## 2023-02-08 NOTE — Anesthesia Postprocedure Evaluation (Signed)
Anesthesia Post Note  Patient: Mike Wright  Procedure(s) Performed: Transcatheter Aortic Valve Replacement, Transfemoral INTRAOPERATIVE TRANSTHORACIC ECHOCARDIOGRAM     Patient location during evaluation: PACU Anesthesia Type: MAC Level of consciousness: awake and alert Pain management: pain level controlled Vital Signs Assessment: post-procedure vital signs reviewed and stable Respiratory status: spontaneous breathing, nonlabored ventilation, respiratory function stable and patient connected to nasal cannula oxygen Cardiovascular status: stable and blood pressure returned to baseline Postop Assessment: no apparent nausea or vomiting Anesthetic complications: no   No notable events documented.  Last Vitals:  Vitals:   02/08/23 0907 02/08/23 1119  BP: 112/60   Pulse:    Resp:    Temp:  (!) 36.4 C  SpO2:      Last Pain:  Vitals:   02/08/23 1119  TempSrc: Axillary  PainSc:                  Nelle Don Tamarion Haymond

## 2023-02-08 NOTE — Evaluation (Signed)
Occupational Therapy Evaluation Patient Details Name: Mike Wright MRN: [id number] DOB: 08-Nov-1933 Today's Date: 02/08/2023   History of Present Illness Pt is an 89 y.o. male s/p transcatheter aortic valve replacement in the treatment of severe aortic stenosis. PMH significant for DMII, HLD, HTN, COPD, OSA, CKD IIIa, chronic anemia, chronic afib, CAD, chronic diastolic CHF.   Clinical Impression   PTA, pt lived with family who provided intermittent assist with LB ADL and provided transportation. Per pt report, he does not get out of the house much. Upon eval, pt needing min A for transfers and intermittent assist for balance. Pt observed to veer to R side of hall during mobility, but did not bump into objects. Will continue to follow acutely, and recommending HHOT to optimize safety in ADL and IADL.       If plan is discharge home, recommend the following: A little help with walking and/or transfers;A little help with bathing/dressing/bathroom;Assistance with cooking/housework;Assist for transportation;Help with stairs or ramp for entrance    Functional Status Assessment  Patient has had a recent decline in their functional status and demonstrates the ability to make significant improvements in function in a reasonable and predictable amount of time.  Equipment Recommendations  None recommended by OT    Recommendations for Other Services       Precautions / Restrictions Precautions Precautions: Fall Precaution Comments: falls frequently, ~3 falls in the last 3 months Restrictions Weight Bearing Restrictions: No      Mobility Bed Mobility Overal bed mobility: Needs Assistance Bed Mobility: Supine to Sit, Sit to Supine     Supine to sit: Contact guard Sit to supine: Contact guard assist   General bed mobility comments: for safety; incresaed time    Transfers Overall transfer level: Needs assistance Equipment used: Rolling walker (2 wheels) Transfers: Sit to/from  Stand Sit to Stand: Min assist, +2 safety/equipment           General transfer comment: Min A for anterior weight shift and rise.      Balance Overall balance assessment: Needs assistance Sitting-balance support: No upper extremity supported, Feet supported Sitting balance-Leahy Scale: Good     Standing balance support: Bilateral upper extremity supported, During functional activity Standing balance-Leahy Scale: Poor                             ADL either performed or assessed with clinical judgement   ADL Overall ADL's : Needs assistance/impaired Eating/Feeding: Set up;Sitting   Grooming: Set up;Sitting   Upper Body Bathing: Set up;Sitting   Lower Body Bathing: Sit to/from stand;Moderate assistance   Upper Body Dressing : Set up;Sitting   Lower Body Dressing: Moderate assistance Lower Body Dressing Details (indicate cue type and reason): for R sock Toilet Transfer: Minimal assistance;Ambulation           Functional mobility during ADLs: Minimal assistance;Rolling walker (2 wheels)       Vision Baseline Vision/History: 1 Wears glasses;2 Legally blind (legally blind L eye) Patient Visual Report: No change from baseline Vision Assessment?:  (wears glasses full time; vision impaired at baseline.)     Perception Perception:  (Pt ambulatory very close to wall on R nearly running into it. to be further assessed)       Praxis         Pertinent Vitals/Pain Pain Assessment Pain Assessment: No/denies pain     Extremity/Trunk Assessment Upper Extremity Assessment Upper Extremity Assessment: Generalized weakness;Left hand dominant;RUE  deficits/detail RUE Deficits / Details: generally 4-/5 as compared to L 4/5   Lower Extremity Assessment Lower Extremity Assessment: RLE deficits/detail;LLE deficits/detail RLE Deficits / Details: hip flexor 3+/5 RLE Sensation: history of peripheral neuropathy LLE Sensation: history of peripheral neuropathy    Cervical / Trunk Assessment Cervical / Trunk Assessment: Kyphotic   Communication Communication Communication: No apparent difficulties   Cognition Arousal: Alert Behavior During Therapy: WFL for tasks assessed/performed Overall Cognitive Status: Within Functional Limits for tasks assessed                                       General Comments       Exercises     Shoulder Instructions      Home Living Family/patient expects to be discharged to:: Private residence Living Arrangements: Spouse/significant other Available Help at Discharge: Family Type of Home: House Home Access: Ramped entrance     Home Layout: One level     Bathroom Shower/Tub: Producer, television/film/video: Handicapped height Bathroom Accessibility: Yes   Home Equipment: Cane - single point;BSC/3in1;Grab bars - tub/shower;Shower seat - built in;Grab bars - toilet;Hand held shower head (3Wheeled RW; walk-in tub)          Prior Functioning/Environment Prior Level of Function : Needs assist             Mobility Comments: mobilizes with 3 wheeled walker at baseline ADLs Comments: Wife often asissts with dressing RLE        OT Problem List: Decreased strength;Decreased activity tolerance;Impaired balance (sitting and/or standing);Decreased safety awareness;Decreased knowledge of use of DME or AE      OT Treatment/Interventions: Self-care/ADL training;Therapeutic exercise;DME and/or AE instruction;Patient/family education;Balance training;Therapeutic activities    OT Goals(Current goals can be found in the care plan section) Acute Rehab OT Goals Patient Stated Goal: go home OT Goal Formulation: With patient Time For Goal Achievement: 02/22/23 Potential to Achieve Goals: Good  OT Frequency: Min 1X/week    Co-evaluation              AM-PAC OT "6 Clicks" Daily Activity     Outcome Measure Help from another person eating meals?: A Little Help from another person  taking care of personal grooming?: A Little Help from another person toileting, which includes using toliet, bedpan, or urinal?: A Lot Help from another person bathing (including washing, rinsing, drying)?: A Lot Help from another person to put on and taking off regular upper body clothing?: A Little Help from another person to put on and taking off regular lower body clothing?: A Lot 6 Click Score: 15   End of Session Equipment Utilized During Treatment: Gait belt;Rolling walker (2 wheels) Nurse Communication: Mobility status  Activity Tolerance: Patient tolerated treatment well Patient left: in bed;with call bell/phone within reach;with family/visitor present  OT Visit Diagnosis: Unsteadiness on feet (R26.81);History of falling (Z91.81);Muscle weakness (generalized) (M62.81)                Time: 6387-5643 OT Time Calculation (min): 30 min Charges:  OT General Charges $OT Visit: 1 Visit OT Evaluation $OT Eval Moderate Complexity: 1 Mod  Tyler Deis, OTR/L [facility] Acute Rehabilitation Office: [phone]   Myrla Halsted 02/08/2023, 1:20 PM

## 2023-02-08 NOTE — Progress Notes (Signed)
   02/08/23 0042  Vitals  Temp 98.3 F (36.8 C)  Temp Source Oral  BP 126/62  MAP (mmHg) 81  BP Location Left Arm  BP Method Automatic  Patient Position (if appropriate) Lying  Pulse Rate 62  Pulse Rate Source Monitor  ECG Heart Rate 65  Resp 20  Level of Consciousness  Level of Consciousness Alert  MEWS COLOR  MEWS Score Color Green  Oxygen Therapy  SpO2 94 %  O2 Device Nasal Cannula  O2 Flow Rate (L/min) 4 L/min  Pain Assessment  Pain Scale 0-10  Pain Score 5  Pain Type Chronic pain  Pain Location Chest  Pain Orientation Anterior;Mid  Pain Descriptors / Indicators Sharp  Pain Frequency Constant  Pain Onset Gradual  Pain Intervention(s)  (ekg)  MEWS Score  MEWS Temp 0  MEWS Systolic 0  MEWS Pulse 0  MEWS RR 0  MEWS LOC 0  MEWS Score 0   Patient c/o of Chest pain 5 out of 10 sharpe. "I have this at home and I take a NTG S.L. and it goes away." Patient stated. Jasmine December RN aware and EKG done no changes Dr. Geraldo Pitter text paged and call return and M.D. aware. M.D. to call back.[name]

## 2023-02-08 NOTE — Progress Notes (Signed)
CARDIAC REHAB PHASE I   Pt resting in bed, feeling well. Worked with PT this am, reports tolerating well. Post TAVR education including site care, restrictions, heart healthy diabetic diet, exercise guidelines and CRP2 reviewed. Pt not interested in CRP2 at this time. Pt and wife expressed interest in PT/OT services for home. CRP1 will sign off today as educational needs met and mobility needs via PT/OT.    [id number] Woodroe Chen, RN BSN 02/08/2023 12:13 PM

## 2023-02-08 NOTE — Procedures (Signed)
Patient Name: ULES BENSON  MRN: [id number]  Epilepsy Attending: Charlsie Quest  Referring Physician/Provider: Caryl Pina, MD  Date: 02/07/2023 Duration: 22.49 mins  Patient history: 89yo M with ams getting eeg to evaluate for seizure  Level of alertness: Awake  AEDs during EEG study: None  Technical aspects: This EEG study was done with scalp electrodes positioned according to the 10-20 International system of electrode placement. Electrical activity was reviewed with band pass filter of 1-70Hz , sensitivity of 7 uV/mm, display speed of 9mm/sec with a 60Hz  notched filter applied as appropriate. EEG data were recorded continuously and digitally stored.  Video monitoring was available and reviewed as appropriate.  Description: The posterior dominant rhythm consists of 9-10 Hz activity of moderate voltage (25-35 uV) seen predominantly in posterior head regions, symmetric and reactive to eye opening and eye closing. Hyperventilation and photic stimulation were not performed.     IMPRESSION: This study is within normal limits. No seizures or epileptiform discharges were seen throughout the recording.  A normal interictal EEG does not exclude the diagnosis of epilepsy.  Berl Bonfanti Annabelle Harman

## 2023-02-08 NOTE — Evaluation (Signed)
Physical Therapy Evaluation Patient Details Name: Mike Wright MRN: [id number] DOB: 03-21-1934 Today's Date: 02/08/2023  History of Present Illness  Pt is an 89 y.o. male s/p transcatheter aortic valve replacement in the treatment of severe aortic stenosis. PMH significant for DMII, HLD, HTN, COPD, OSA, CKD IIIa, chronic anemia, chronic afib, CAD, chronic diastolic CHF.   Clinical Impression  Mike Wright is 89 y.o. male admitted with above HPI and diagnosis. Patient is currently limited by functional impairments below (see PT problem list). Patient lives with spouse and is mod ind with RW at baseline. Currently he requires CGA for safety with bed mobility and Min assist for transfers and gait with RW. Pt with tendency to drift Rt with gait and cues/assist needed to direct path of gait. Patient will benefit from continued skilled PT interventions to address impairments and progress independence with mobility, recommending HHPT with assist from family. Acute PT will follow and progress as able.         If plan is discharge home, recommend the following: A little help with walking and/or transfers;A little help with bathing/dressing/bathroom;Assistance with cooking/housework;Help with stairs or ramp for entrance;Assist for transportation;Direct supervision/assist for medications management   Can travel by private vehicle        Equipment Recommendations None recommended by PT  Recommendations for Other Services       Functional Status Assessment Patient has had a recent decline in their functional status and demonstrates the ability to make significant improvements in function in a reasonable and predictable amount of time.     Precautions / Restrictions Precautions Precautions: Fall Precaution Comments: falls frequently, ~3 falls in the last 3 months Restrictions Weight Bearing Restrictions: No      Mobility  Bed Mobility Overal bed mobility: Needs Assistance Bed Mobility:  Supine to Sit, Sit to Supine     Supine to sit: Contact guard Sit to supine: Contact guard assist   General bed mobility comments: for safety; incresaed time    Transfers Overall transfer level: Needs assistance Equipment used: Rolling walker (2 wheels) Transfers: Sit to/from Stand Sit to Stand: Min assist, +2 safety/equipment           General transfer comment: Min A for anterior weight shift and rise.    Ambulation/Gait Ambulation/Gait assistance: Min assist, +2 safety/equipment Gait Distance (Feet): 80 Feet Assistive device: Rolling walker (2 wheels) Gait Pattern/deviations: Step-through pattern, Decreased step length - right, Decreased step length - left, Decreased stride length, Shuffle, Trunk flexed Gait velocity: decr     General Gait Details: cuse for proximity to RW and assist to maintain during turns. posture flexed throughout with cues needed to look up. VSS.  Stairs            Wheelchair Mobility     Tilt Bed    Modified Rankin (Stroke Patients Only)       Balance Overall balance assessment: Needs assistance Sitting-balance support: No upper extremity supported, Feet supported Sitting balance-Leahy Scale: Good     Standing balance support: Bilateral upper extremity supported, During functional activity Standing balance-Leahy Scale: Poor                               Pertinent Vitals/Pain Pain Assessment Pain Assessment: No/denies pain    Home Living Family/patient expects to be discharged to:: Private residence Living Arrangements: Spouse/significant other Available Help at Discharge: Family Type of Home: House Home Access: Ramped entrance  Home Layout: One level Home Equipment: Cane - single point;BSC/3in1;Grab bars - tub/shower;Shower seat - built in;Grab bars - toilet;Hand held shower head (3Wheeled RW; walk-in tub)      Prior Function Prior Level of Function : Needs assist             Mobility  Comments: mobilizes with 3 wheeled walker at baseline ADLs Comments: Wife often asissts with dressing RLE     Extremity/Trunk Assessment   Upper Extremity Assessment Upper Extremity Assessment: Defer to OT evaluation RUE Deficits / Details: generally 4-/5 as compared to L 4/5    Lower Extremity Assessment Lower Extremity Assessment: RLE deficits/detail;LLE deficits/detail RLE Deficits / Details: grossly 3+/5 on Rt and 4/5 on Lt RLE Sensation: history of peripheral neuropathy LLE Deficits / Details: grossly 3+/5 on Rt and 4/5 on Lt LLE Sensation: history of peripheral neuropathy    Cervical / Trunk Assessment Cervical / Trunk Assessment: Kyphotic  Communication   Communication Communication: No apparent difficulties  Cognition Arousal: Alert Behavior During Therapy: WFL for tasks assessed/performed Overall Cognitive Status: Within Functional Limits for tasks assessed                                          General Comments      Exercises     Assessment/Plan    PT Assessment Patient needs continued PT services  PT Problem List Decreased strength;Decreased activity tolerance;Decreased balance;Decreased mobility;Decreased coordination;Decreased knowledge of use of DME;Decreased cognition;Decreased safety awareness;Decreased knowledge of precautions       PT Treatment Interventions DME instruction;Patient/family education;Cognitive remediation;Neuromuscular re-education;Balance training;Therapeutic exercise;Therapeutic activities;Functional mobility training;Stair training;Gait training    PT Goals (Current goals can be found in the Care Plan section)  Acute Rehab PT Goals Patient Stated Goal: return home and improve mobility PT Goal Formulation: With patient/family Time For Goal Achievement: 02/22/23 Potential to Achieve Goals: Good    Frequency Min 1X/week     Co-evaluation               AM-PAC PT "6 Clicks" Mobility  Outcome Measure  Help needed turning from your back to your side while in a flat bed without using bedrails?: A Little Help needed moving from lying on your back to sitting on the side of a flat bed without using bedrails?: A Little Help needed moving to and from a bed to a chair (including a wheelchair)?: A Little Help needed standing up from a chair using your arms (e.g., wheelchair or bedside chair)?: A Little Help needed to walk in hospital room?: A Little Help needed climbing 3-5 steps with a railing? : A Lot 6 Click Score: 17    End of Session Equipment Utilized During Treatment: Gait belt Activity Tolerance: Patient tolerated treatment well Patient left: in bed;with call bell/phone within reach;with bed alarm set;with family/visitor present Nurse Communication: Mobility status PT Visit Diagnosis: Muscle weakness (generalized) (M62.81);Difficulty in walking, not elsewhere classified (R26.2);Other abnormalities of gait and mobility (R26.89);Other symptoms and signs involving the nervous system (R29.898)    Time: 5366-4403 PT Time Calculation (min) (ACUTE ONLY): 32 min   Charges:   PT Evaluation $PT Eval Moderate Complexity: 1 Mod   PT General Charges $$ ACUTE PT VISIT: 1 Visit         Wynn Maudlin, DPT Acute Rehabilitation Services Office [phone]  02/08/23 3:58 PM

## 2023-02-08 NOTE — Progress Notes (Addendum)
[facility]   MULTIDISCIPLINARY HEART VALVE TEAM  Patient Name: Mike Wright Date of Encounter: 02/08/2023  Admit date: 02/07/2023  Primary Care Provider: [name], MD [facility] Cardiologist: Dina Rich, MD  [facility] Electrophysiologist:  None   Hospital Problem List     Principal Problem:   S/P TAVR (transcatheter aortic valve replacement) Active Problems:   Essential hypertension   CAD S/P percutaneous coronary angioplasty   Chronic diastolic CHF (congestive heart failure) (HCC)   PAF (paroxysmal atrial fibrillation) (HCC)   Type 2 diabetes mellitus (HCC)   COPD (chronic obstructive pulmonary disease) (HCC)   PAD (peripheral artery disease) (HCC)   Chronic respiratory failure with hypoxia (HCC)   Severe aortic stenosis     Subjective   Had some chest pain last night that is similar to his chronic chest pain. Started on a nitroglycerin gtt. No other complaints. Feels back to his baseline. Wife at bedside.    Inpatient Medications    Scheduled Meds:   stroke: early stages of recovery book   Does not apply Once   amLODipine  2.5 mg Oral Daily   apixaban  2.5 mg Oral BID   atorvastatin  40 mg Oral q1800   clopidogrel  75 mg Oral Q breakfast   insulin aspart  0-24 Units Subcutaneous TID AC & HS   isosorbide mononitrate  60 mg Oral Daily   pantoprazole  40 mg Oral BID   sodium chloride flush  3 mL Intravenous Q12H   traZODone  100 mg Oral QHS   Continuous Infusions:  sodium chloride     nitroGLYCERIN Stopped (02/08/23 0906)   PRN Meds: sodium chloride, acetaminophen **OR** acetaminophen, diazepam, hydrALAZINE, ondansetron (ZOFRAN) IV, sodium chloride flush   Vital Signs    Vitals:   02/08/23 0700 02/08/23 0738 02/08/23 0800 02/08/23 0907  BP: (!) 131/57 128/62 (!) 116/46 112/60  Pulse: 64 75 71   Resp: (!) 22 (!) 26 (!) 24   Temp:  98.5 F (36.9 C)    TempSrc:  Oral    SpO2: 93% 95% 92%   Weight:      Height:         Intake/Output Summary (Last 24 hours) at 02/08/2023 1057 Last data filed at 02/08/2023 0842 Gross per 24 hour  Intake 1114.96 ml  Output 1400 ml  Net -285.04 ml   Filed Weights   02/07/23 0913 02/08/23 0500  Weight: 77.4 kg 76.7 kg    Physical Exam    GEN: Well nourished, well developed, in no acute distress.  HEENT: Grossly normal.  Neck: Supple, no JVD, carotid bruits, or masses. Cardiac: irreg irreg, no murmurs, rubs, or gallops. No clubbing, cyanosis, edema.   Respiratory:  Respirations regular and unlabored, clear to auscultation bilaterally. GI: Soft, nontender, nondistended, BS + x 4. MS: no deformity or atrophy. S/p bilateral toe amputations Skin: warm and dry, no rash.  Groin sites clear without hematoma or ecchymosis  Neuro:  Strength and sensation are intact. Psych: AAOx3.  Normal affect.  Labs    CBC Recent Labs    02/07/23 1354 02/08/23 0434  WBC 4.7 6.9  HGB 11.3* 11.5*  HCT 34.0* 34.5*  MCV 87.0 88.5  PLT 100* 92*   Basic Metabolic Panel Recent Labs    32/44/01 1231 02/08/23 0434  NA 139 141  K 3.4* 4.2  CL 99 107  CO2  --  28  GLUCOSE 133* 127*  BUN 28* 20  CREATININE 1.30* 1.45*  CALCIUM  --  8.8*  MG  --  1.8   Liver Function Tests No results for input(s): "AST", "ALT", "ALKPHOS", "BILITOT", "PROT", "ALBUMIN" in the last 72 hours. No results for input(s): "LIPASE", "AMYLASE" in the last 72 hours. Cardiac Enzymes No results for input(s): "CKTOTAL", "CKMB", "CKMBINDEX", "TROPONINI" in the last 72 hours. BNP Invalid input(s): "POCBNP" D-Dimer No results for input(s): "DDIMER" in the last 72 hours. Hemoglobin A1C Recent Labs    02/07/23 1803  HGBA1C 6.8*   Fasting Lipid Panel Recent Labs    02/08/23 0434  CHOL 81  HDL 20*  LDLCALC 43  TRIG 89  CHOLHDL 4.1   Thyroid Function Tests No results for input(s): "TSH", "T4TOTAL", "T3FREE", "THYROIDAB" in the last 72 hours.  Invalid input(s): "FREET3"  Telemetry    Atrial  fibrillation with well controlled rates - Personally Reviewed  ECG    Atrial fibrillation with HR 66, septal Q waves. - Personally Reviewed  Radiology    EEG adult  Result Date: 02/08/2023 Mike Quest, MD     02/08/2023  8:46 AM Patient Name: Mike Wright MRN: [id number] Epilepsy Attending: Charlsie Wright Referring Physician/Provider: Caryl Pina, MD Date: 02/07/2023 Duration: 22.49 mins Patient history: 89yo M with ams getting eeg to evaluate for seizure Level of alertness: Awake AEDs during EEG study: None Technical aspects: This EEG study was done with scalp electrodes positioned according to the 10-20 International system of electrode placement. Electrical activity was reviewed with band pass filter of 1-70Hz , sensitivity of 7 uV/mm, display speed of 37mm/sec with a 60Hz  notched filter applied as appropriate. EEG data were recorded continuously and digitally stored.  Video monitoring was available and reviewed as appropriate. Description: The posterior dominant rhythm consists of 9-10 Hz activity of moderate voltage (25-35 uV) seen predominantly in posterior head regions, symmetric and reactive to eye opening and eye closing. Hyperventilation and photic stimulation were not performed.   IMPRESSION: This study is within normal limits. No seizures or epileptiform discharges were seen throughout the recording. A normal interictal EEG does not exclude the diagnosis of epilepsy. Mike Wright   MR BRAIN WO CONTRAST  Result Date: 02/07/2023 CLINICAL DATA:  Provided history: Neuro deficit, acute, stroke suspected. EXAM: MRI HEAD WITHOUT CONTRAST TECHNIQUE: Multiplanar, multiecho pulse sequences of the brain and surrounding structures were obtained without intravenous contrast. COMPARISON:  Non-contrast head CT and CT angiogram head/neck performed earlier today 02/07/2023. FINDINGS: The patient was unable to tolerate the full examination. The following sequences could be acquired: axial  diffusion-weighted, coronal diffusion-weighted, axial T2 FLAIR. The axial T2 FLAIR sequence is severely motion degraded and essentially non-diagnostic. No evidence of an acute infarct. Incompletely assessed chronic small vessel ischemic changes within the cerebral white matter. IMPRESSION: 1. Prematurely terminated examination consisting of diffusion-weighted imaging and a severely motion degraded axial T2 FLAIR sequence. 2. No evidence of an acute infarct. 3. Incompletely assessed cerebral white matter chronic small vessel ischemic disease. Electronically Signed   By: Jackey Loge D.O.   On: 02/07/2023 15:02   CT ANGIO HEAD NECK W WO CM W PERF (CODE STROKE)  Addendum Date: 02/07/2023   ADDENDUM REPORT: 02/07/2023 14:43 ADDENDUM: CTA head impression #2 called by telephone at the time of interpretation on 02/07/2023 at 2:40 pm to provider ERIC [facility] , who verbally acknowledged these results. Electronically Signed   By: Jackey Loge D.O.   On: 02/07/2023 14:43   Result Date: 02/07/2023 CLINICAL DATA:  Provided history: Mutism. EXAM: CT ANGIOGRAPHY  HEAD AND NECK CT PERFUSION BRAIN TECHNIQUE: Multidetector CT imaging of the head and neck was performed using the standard protocol during bolus administration of intravenous contrast. Multiplanar CT image reconstructions and MIPs were obtained to evaluate the vascular anatomy. Carotid stenosis measurements (when applicable) are obtained utilizing NASCET criteria, using the distal internal carotid diameter as the denominator. Multiphase CT imaging of the brain was performed following IV bolus contrast injection. Subsequent parametric perfusion maps were calculated using RAPID software. RADIATION DOSE REDUCTION: This exam was performed according to the departmental dose-optimization program which includes automated exposure control, adjustment of the mA and/or kV according to patient size and/or use of iterative reconstruction technique. CONTRAST:  99mL ISOVUE-370  IOPAMIDOL (ISOVUE-370) INJECTION 76% COMPARISON:  Noncontrast head CT performed earlier today 02/07/2023. FINDINGS: CTA NECK FINDINGS Suboptimal arterial contrast timing. Within this limitation, findings are as follows. Aortic arch: Standard aortic branching. Atherosclerotic plaque within the visualized aortic arch and proximal major branch vessels of the neck. Streak/beam hardening artifact arising from a dense left-sided contrast bolus partially obscures the left subclavian artery. Within this limitation, there is no appreciable hemodynamically significant innominate or proximal subclavian artery stenosis. Right carotid system: CCA and ICA patent within the neck without hemodynamically significant stenosis (50% or greater). Mild atherosclerotic plaque about the carotid bifurcation and within the proximal ICA. Partially retropharyngeal course of the cervical ICA. Left carotid system: CCA and ICA patent within the neck without hemodynamically significant stenosis (50% or greater). Atherosclerotic plaque within the CCA, about the carotid bifurcation and within the proximal and mid ICA. Atherosclerotic plaque is greatest about the carotid bifurcation and within the proximal ICA (mild-to-moderate at this site). Vertebral arteries: Venous reflux limits evaluation of the left vertebral artery V1 segment proximally. Within this limitation, the vertebral arteries are patent within the neck without hemodynamically significant stenosis (50% or greater). Nonstenotic calcified plaque within both vertebral arteries at the V3/V4 junction. Skeleton: Cervical spondylosis. Nonspecific reversal of the expected cervical lordosis. C2-C3 and C3-C4 grade 1 anterolisthesis. C4-C5 grade 1 retrolisthesis. Other neck: Heterogeneous and enlarged left thyroid lobe extending into the upper mediastinum. Upper chest: Cardiomegaly. Prior aortic valve replacement. Emphysema. Interlobular septal thickening and dependent pulmonary opacities at the  imaged levels. Review of the MIP images confirms the above findings CTA HEAD FINDINGS Suboptimal arterial contrast timing. Within this limitation, findings are as follows. Anterior circulation: The intracranial internal carotid arteries are patent. Calcified atherosclerotic plaque within both vessels. Up to moderate stenosis within the cavernous segment on the right. No more than mild stenosis of the intracranial left ICA. The M1 middle cerebral arteries are patent. No M2 proximal branch occlusion or high-grade proximal stenosis. The anterior cerebral arteries are patent. No intracranial aneurysm is identified. Posterior circulation: The intracranial vertebral arteries are patent. Non-stenotic calcified plaque within the bilateral vertebral arteries at the V3/V4 junction. Apparent irregular subocclusive filling defect within the very distal right vertebral artery V4 segment and vertebrobasilar junction (with at least moderate vessel narrowing). (For instance as seen on series 6, image 142) (series 8, image 138). Elsewhere, the basilar artery is patent without stenosis. The posterior cerebral arteries are patent. Severe stenosis within a left posterior cerebral artery branch at the P3/P4 junction level (series 10, image 21). Venous sinuses: Posterior communicating arteries are diminutive or absent, bilaterally. Within the limitations of contrast timing, no convincing thrombus. Anatomic variants: As described. Review of the MIP images confirms the above findings CT Brain Perfusion Findings: CBF (<30%) Volume: 0mL Perfusion (Tmax>6.0s) volume: 0mL Mismatch  Volume: 0 mL Infarction Location: none identified Attempts are being made to reach the ordering provider at this time. IMPRESSION: CTA neck: 1. Suboptimal arterial contrast timing. 2. The common carotid and internal carotid arteries are patent within the neck without hemodynamically significant stenosis. Atherosclerotic plaque bilaterally, as described. 3. Venous  reflux limits evaluation of the left vertebral artery V1 segment proximally. Within this limitation, the vertebral arteries are patent within the neck bilaterally without hemodynamically significant stenosis. Non-stenotic atherosclerotic plaque within both vertebral arteries at the V3/V4 junction. 4. Pulmonary interlobular septal thickening and dependent opacities at the imaged levels. Findings may reflect pulmonary edema and atelectasis, respectively. However, exclude signs/symptoms that would suggest infection. 5. Heterogeneous and enlarged left thyroid lobe extending into the upper mediastinum. A non-emergent thyroid ultrasound is recommended for further evaluation. Reference: J Am Coll Radiol. 2015 Feb;12(2): 143-50. 6. Aortic Atherosclerosis (ICD10-I70.0) and Emphysema (ICD10-J43.9). CTA head: 1. Suboptimal arterial contrast timing. 2. Apparent subocclusive filling defect suspicious for thrombus within the very distal V4 segment of the right vertebral artery, extending to the vertebrobasilar junction (with at least moderate vessel narrowing). 3. Background intracranial atherosclerotic disease as described. Most notably, there is up to moderate stenosis within the right ICA cavernous segment, and there is a severe stenosis within a left PCA branch at the P3/P4 junction. CT perfusion head: The perfusion software identifies no core infarct. The perfusion software identifies no critically hypoperfused parenchyma (utilizing the Tmax>6 seconds threshold.). No mismatch volume reported. Electronically Signed: By: Jackey Loge D.O. On: 02/07/2023 14:34   CT HEAD CODE STROKE WO CONTRAST  Result Date: 02/07/2023 CLINICAL DATA:  Code stroke. Neuro deficit, acute, stroke suspected. Mutism. EXAM: CT HEAD WITHOUT CONTRAST TECHNIQUE: Contiguous axial images were obtained from the base of the skull through the vertex without intravenous contrast. RADIATION DOSE REDUCTION: This exam was performed according to the  departmental dose-optimization program which includes automated exposure control, adjustment of the mA and/or kV according to patient size and/or use of iterative reconstruction technique. COMPARISON:  None Available. FINDINGS: Brain: No acute hemorrhage. Cortical gray-white differentiation is preserved. Patchy hypoattenuation of the periventricular white matter, most consistent with mild chronic small-vessel disease. Prominence of the ventricles and sulci within normal limits for age. No extra-axial collection. Basilar cisterns are patent. Vascular: No hyperdense vessel or unexpected calcification. Skull: No calvarial fracture or suspicious bone lesion. Skull base is unremarkable. Sinuses/Orbits: No acute findings. Other: None. ASPECTS [facility] Stroke Program Early CT Score) - Ganglionic level infarction (caudate, lentiform nuclei, internal capsule, insula, M1-M3 cortex): 7 - Supraganglionic infarction (M4-M6 cortex): 3 Total score (0-10 with 10 being normal): 10 IMPRESSION: 1. No acute intracranial hemorrhage or evidence of acute large vessel territory infarct. ASPECT score is 10. 2. Mild chronic small-vessel disease. Code stroke imaging results were communicated on 02/07/2023 at 1:40 pm to provider Dr. Otelia Limes via secure text paging. Electronically Signed   By: Orvan Falconer M.D.   On: 02/07/2023 13:40   ECHOCARDIOGRAM LIMITED  Result Date: 02/07/2023    ECHOCARDIOGRAM LIMITED REPORT   Patient Name:   Mike Wright Date of Exam: 02/07/2023 Medical Rec #:  [id number]       Height:       67.0 in Accession #:    [id number]      Weight:       170.6 lb Date of Birth:  03-25-1934       BSA:          1.890 m Patient Age:  89 years        BP:           114/59 mmHg Patient Gender: M               HR:           51 bpm. Exam Location:  Inpatient Procedure: Limited Echo, Cardiac Doppler and Color Doppler Indications:     I35.0 Nonrheumatic aortic (valve) stenosis  History:         Patient has prior history of  Echocardiogram examinations, most                  recent 07/19/2022. CAD, Abnormal ECG, Aortic Valve Disease,                  Arrythmias:Atrial Flutter, Signs/Symptoms:Dyspnea and Shortness                  of Breath; Risk Factors:Hypertension and Diabetes. Aortic                  stenosis.                  Aortic Valve: 26 mm Edwards Sapien prosthetic, stented (TAVR)                  valve is present in the aortic position. Procedure Date:                  02/07/2023.  Sonographer:     Sheralyn Boatman RDCS Referring Phys:  [id number] Arielle Eber Diagnosing Phys: Charlton Haws MD IMPRESSIONS  1. Left ventricular ejection fraction, by estimation, is 60 to 65%. The left ventricle has normal function. There is mild left ventricular hypertrophy.  2. Right ventricular systolic function is low normal.  3. The mitral valve is degenerative. Mild mitral valve regurgitation. Moderate mitral annular calcification.  4. The aortic valve has been repaired/replaced. There is a 26 mm Edwards Sapien prosthetic (TAVR) valve present in the aortic position. Procedure Date: 02/07/2023. Echo findings are consistent with trivial perivalvular leak of the aortic prosthesis.  5. Aortic dilatation noted. There is mild dilatation of the ascending aorta, measuring 39 mm. FINDINGS  Left Ventricle: Left ventricular ejection fraction, by estimation, is 60 to 65%. The left ventricle has normal function. There is mild left ventricular hypertrophy. Right Ventricle: Right ventricular systolic function is low normal. Pericardium: There is no evidence of pericardial effusion. Mitral Valve: The mitral valve is degenerative in appearance. There is moderate thickening of the mitral valve leaflet(s). There is moderate calcification of the mitral valve leaflet(s). Moderate mitral annular calcification. Mild mitral valve regurgitation. Tricuspid Valve: Tricuspid valve regurgitation is mild. Aortic Valve: Pre TAVR: tri leaflet valve with calcium and restrictied leaflet  motion Mild/moderate AR severe AS with mean gradient 27 peak 48 mmHg supine in lab. AVA 0.87 cm2 Post TAVR: well positioned 26 mm Sapien 3 valve. Trivial PVL seen at 5:00 on PSSA views. mean gradient 5 peak 9 mmHg AVA 3.3 cm2. The aortic valve has been repaired/replaced. Aortic valve mean gradient measures 5.0 mmHg. Aortic valve peak gradient measures 9.0 mmHg. Aortic valve area, by VTI measures 3.29 cm. There is a 26 mm Edwards Sapien prosthetic, stented (TAVR) valve present in the aortic position. Procedure Date: 02/07/2023. Echo findings are consistent with normal structure and function of the aortic valve prosthesis. Aorta: Aortic dilatation noted. There is mild dilatation of the ascending aorta, measuring 39 mm. Additional Comments: Spectral Doppler performed. Color Doppler  performed.  LEFT VENTRICLE PLAX 2D LVIDd:         4.90 cm LVIDs:         3.00 cm LV PW:         1.10 cm LV IVS:        1.20 cm LVOT diam:     2.30 cm LV SV:         113 LV SV Index:   60 LVOT Area:     4.15 cm  AORTIC VALVE AV Area (Vmax):    3.49 cm AV Area (Vmean):   3.32 cm AV Area (VTI):     3.29 cm AV Vmax:           150.00 cm/s AV Vmean:          96.500 cm/s AV VTI:            0.345 m AV Peak Grad:      9.0 mmHg AV Mean Grad:      5.0 mmHg LVOT Vmax:         126.00 cm/s LVOT Vmean:        77.100 cm/s LVOT VTI:          0.273 m LVOT/AV VTI ratio: 0.79  AORTA Ao Asc diam: 3.90 cm  SHUNTS Systemic VTI:  0.27 m Systemic Diam: 2.30 cm Charlton Haws MD Electronically signed by Charlton Haws MD Signature Date/Time: 02/07/2023/12:48:04 PM    Final    Structural Heart Procedure  Result Date: 02/07/2023 See surgical note for result.    Patient Profile     Mike Wright is a 89 y.o. male with a complex medical history of CAD s/p PCI to D1/mLAD/pLAD 2019, HFpEF with 07/2022 admission, COPD home O2 PRN, persistent Afib on eliquis, HIT, PAD with previous toe amputations, balance issues, neuropathy, CKD stage IIIa, chronic  anemia/thrombocytopenia, history of encephalopathy, T2DM, HTN, HLD, OSA not on CPAP and severe AS who [facility]07/2023 for planned TAVR.   Assessment & Plan    Severe AS: s/p successful TAVR with a 26 mm Edwards Sapien 3 THV via the TF approach on 02/07/23. Post operative echo completed but pending formal read. Groin sites are stable. ECG with chronic afib and no high grade heart block. Continued on home Eliquis 5mg  BID and Plavix 75mg  daily. Plan to keep overnight with discharge tomorrow AM.  Acute encephalopathy: the patient was unresponsive after TAVR for a prolonged period of time. He had no focal neurologic deficits but globally unresponsive. Code stroke was called and all imaging negative for acute CVA. MRI prematurely terminated bc pt could not sit still. EEG was negative. He is now back to his baseline cognitive status. Appreciate neuro team.  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 Per neuro, "consider re-imaging with CTA of head and neck in 4-6 weeks to assess for resolution of the possible distal left vertebral artery thrombus seen on CTA. The apparent finding is likely too small for MRA to be of utility as a follow up imaging modality." Of note, a thyroid nodule was also noted that may require follow up.     CAD: [facility] 12/16/22 showed continued patency of the stented segments in the LAD and diagonal, nonobstructive plaquing in the circumflex and RCA, and no high-grade stenoses. Continue medical therapy with Plavix 75mg  daily and Lipitor 40mg  daily. He has chronic stable angina. He had some chest pain last night and started on a nitroglycerin gtt. This has been discontinued. Okay to  use SL NTG.   History of HIT: Angiomax was used instead of heparin.    HFpEF: LVEDP noted to be 11. He appears euvolemic. Resumed on home Demadex 20mg  BID and Kdur 20 meq BID.    Persistent atrial fibrillation: restarted on low dose Eliquis. He is well rate controlled off AV nodal  blocking agents.   CKD Stage IIIa: creat 1.45 today, which is within his baseline.    Chronic anemia/thrombocytopenia: Hg/plt stable at 11.5/92. Followed by heme/onc in the outpatient setting.    PAD: s/p multiple metatarsal amputations. Follows with [facility].    COPD: wears PRN oxygen. Continue Spiriva, Duoneb.    HTN: BP well controlled currently.    DMT2: continue SSI   Signed, Cline Crock, PA-C  02/08/2023, 10:57 AM  Pager [phone]  Patient seen, examined. Available data reviewed. Agree with findings, assessment, and plan as outlined by Carlean Jews, PA-C.  The patient is independently interviewed and examined.  His wife is at the bedside.  He is alert, oriented, in no distress.  HEENT is normal, JVP is normal, lungs are clear bilaterally, heart is irregularly irregular with no murmur gallop, abdomen soft nontender, right groin and left groin sites are clear, extremities have no edema.  The patient's telemetry shows atrial fibrillation which is known and chronic.  His echo is reviewed and shows normal TAVR valve function.  Will observe him overnight and plan to discharge him tomorrow as long as he remains stable.  Tonny Bollman, M.D. 02/08/2023 9:54 PM

## 2023-02-09 DIAGNOSIS — I350 Nonrheumatic aortic (valve) stenosis: Secondary | ICD-10-CM | POA: Diagnosis not present

## 2023-02-09 LAB — BASIC METABOLIC PANEL
Anion gap: 8 (ref 5–15)
BUN: 28 mg/dL — ABNORMAL HIGH (ref 8–23)
CO2: 24 mmol/L (ref 22–32)
Calcium: 8.6 mg/dL — ABNORMAL LOW (ref 8.9–10.3)
Chloride: 104 mmol/L (ref 98–111)
Creatinine, Ser: 1.54 mg/dL — ABNORMAL HIGH (ref 0.61–1.24)
GFR, Estimated: 43 mL/min — ABNORMAL LOW (ref >60)
Glucose, Bld: 128 mg/dL — ABNORMAL HIGH (ref 70–99)
Potassium: 3.8 mmol/L (ref 3.5–5.1)
Sodium: 136 mmol/L (ref 135–145)

## 2023-02-09 LAB — GLUCOSE, CAPILLARY: Glucose-Capillary: 141 mg/dL — ABNORMAL HIGH (ref 70–99)

## 2023-02-09 LAB — CBC
HCT: 31 % — ABNORMAL LOW (ref 39.0–52.0)
Hemoglobin: 10.5 g/dL — ABNORMAL LOW (ref 13.0–17.0)
MCH: 29.8 pg (ref 26.0–34.0)
MCHC: 33.9 g/dL (ref 30.0–36.0)
MCV: 88.1 fL (ref 80.0–100.0)
Platelets: 78 10*3/uL — ABNORMAL LOW (ref 150–400)
RBC: 3.52 MIL/uL — ABNORMAL LOW (ref 4.22–5.81)
RDW: 15.9 % — ABNORMAL HIGH (ref 11.5–15.5)
WBC: 5.5 10*3/uL (ref 4.0–10.5)
nRBC: 0 % (ref 0.0–0.2)

## 2023-02-09 NOTE — Plan of Care (Signed)
  Problem: Nutrition: Goal: Dietary intake will improve Outcome: Progressing   Problem: Nutrition: Goal: Risk of aspiration will decrease Outcome: Progressing   Problem: Health Behavior/Discharge Planning: Goal: Goals will be collaboratively established with patient/family Outcome: Progressing   Problem: Health Behavior/Discharge Planning: Goal: Ability to manage health-related needs will improve Outcome: Progressing   Problem: Coping: Goal: Will identify appropriate support needs Outcome: Progressing

## 2023-02-09 NOTE — Evaluation (Signed)
Speech Language Pathology Evaluation Patient Details Name: Mike Wright MRN: [id number] DOB: [birth year] Today's Date: 02/09/2023 Time: 3329-5188 SLP Time Calculation (min) (ACUTE ONLY): 16 min  Problem List:  Patient Active Problem List   Diagnosis Date Noted   Aortic stenosis, severe 02/07/2023   S/P TAVR (transcatheter aortic valve replacement) 02/07/2023   Thrombocytopenia (HCC) 08/29/2022   Atrial fibrillation, chronic (HCC) 07/19/2022   Mixed hyperlipidemia 07/19/2022   Acute on chronic diastolic CHF (congestive heart failure) (HCC) 07/18/2022   Malnutrition of moderate degree 11/12/2021   Gangrene of left foot (HCC) 11/10/2021   Acute kidney injury superimposed on chronic kidney disease (HCC) 11/10/2021   Hypokalemia 02/03/2020   Cellulitis of right foot    Open wound of foot, right, initial encounter 02/02/2020   Open wound of foot with complication, initial encounter 02/02/2020   Acute urinary retention 07/27/2019   Anaphylaxis 07/26/2019   Gangrene of toe of right foot (HCC) 02/28/2019   Gastrointestinal hemorrhage 12/10/2018   Orthostatic hypotension    Gastroesophageal reflux disease without esophagitis    Chronic respiratory failure with hypoxia (HCC)    Bradycardia 11/26/2018   Abdominal pain, epigastric 11/26/2018   Acute blood loss anemia 11/26/2018   PAD (peripheral artery disease) (HCC) 12/13/2017   Non-healing surgical wound 12/13/2017   Infected surgical wound 12/13/2017   Paroxysmal atrial flutter (HCC)    Dyspnea 12/02/2017   Pancytopenia (HCC) 12/02/2017   Coronary artery disease involving native coronary artery of native heart without angina pectoris 11/20/2017   Lower abdominal pain    Nausea without vomiting    Microcytic anemia 11/18/2017   Volume overload 11/18/2017   Chest pain 11/17/2017   Gangrene (HCC)    Urinary retention 09/16/2017   MRSA bacteremia 09/15/2017   Gangrene of bilateral great toes (HCC) 09/13/2017   COPD (chronic  obstructive pulmonary disease) (HCC) 05/19/2017   Iron deficiency anemia due to chronic blood loss    CAD S/P percutaneous coronary angioplasty    Chronic diastolic CHF (congestive heart failure) (HCC)    PSVT (paroxysmal supraventricular tachycardia) (HCC)    PAF (paroxysmal atrial fibrillation) (HCC)    Sleep apnea    Type 2 diabetes mellitus (HCC)    Status post coronary artery stent placement    Essential hypertension 04/15/2010   Diabetes (HCC) 04/14/2010   Past Medical History:  Past Medical History:  Diagnosis Date   Anemia    a. mild, noted 04/2017.   CAD in native artery    a. [place] 04/2017 s/p DES to D1, DES to prox-mid LAD, DES to prox LAD overlapping the prior stent, LVEF 55-65%.    Chronic a-fib (HCC)    Chronic diastolic CHF (congestive heart failure) (HCC)    Constipation    COPD (chronic obstructive pulmonary disease) (HCC)    Diabetic ulcer of toe (HCC)    DJD (degenerative joint disease) of cervical spine    Essential hypertension    GERD (gastroesophageal reflux disease)    History of hiatal hernia    HIT (heparin-induced thrombocytopenia) (HCC)    Hypothyroidism    Hypoxia    a. went home on home O2 04/2017.   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 Excell Seltzer & Dr. Laneta Simmers   Sinus bradycardia    a. HR 30s-40s in 04/2017 -> diltiazem stopped, metoprolol reduced.   Sleep apnea    "chose not to order CPAP at this time" (05/18/2017)   Type 2 diabetes mellitus (HCC)    Wears glasses    Past Surgical History:  Past Surgical History:  Procedure Laterality Date   ABDOMINAL AORTOGRAM W/LOWER EXTREMITY N/A 09/15/2017   Procedure: ABDOMINAL AORTOGRAM W/LOWER EXTREMITY;  Surgeon: Nada Libman, MD;  [facility];   Service: Cardiovascular;  Laterality: N/A;   ABDOMINAL AORTOGRAM W/LOWER EXTREMITY N/A 02/04/2020   Procedure: ABDOMINAL AORTOGRAM W/LOWER EXTREMITY;  Surgeon: Nada Libman, MD;  [facility]: [facility];  Service: Cardiovascular;  Laterality: N/A;   ABDOMINAL AORTOGRAM W/LOWER EXTREMITY N/A 12/15/2020   Procedure: ABDOMINAL AORTOGRAM W/LOWER EXTREMITY;  Surgeon: Nada Libman, MD;  [facility]: [facility];  Service: Cardiovascular;  Laterality: N/A;   AMPUTATION Bilateral 09/20/2017   Procedure: BILATERAL GREAT TOE AMPUTATIONS INCLUDING METATARSAL HEADS;  Surgeon: Nada Libman, MD;  Location: [facility] OR;  Service: Vascular;  Laterality: Bilateral;   AMPUTATION Right 03/01/2019   Procedure: AMPUTATION TOES;  Surgeon: Chuck Hint, MD;  Location: [facility] OR;  Service: Vascular;  Laterality: Right;   AMPUTATION Right 05/29/2019   Procedure: AMPUTATION RIGHT THIRD TOE;  Surgeon: Nada Libman, MD;  Location: [facility] OR;  Service: Vascular;  Laterality: Right;   AMPUTATION Right 09/26/2019   Procedure: AMPUTATION RIGHT FOURTH TOE AND FIFTH TOES;  Surgeon: Nada Libman, MD;  Location: [facility] OR;  Service: Vascular;  Laterality: Right;   ANGIOPLASTY Right 09/20/2017   Procedure: ANGIOPLASTY RIGHT TIBIAL ARTERY;  Surgeon: Nada Libman, MD;  Location: [facility] OR;  Service: Vascular;  Laterality: Right;   APPENDECTOMY     APPLICATION OF WOUND VAC  12/17/2020   Procedure: APPLICATION OF WOUND VAC;  Surgeon: Nada Libman, MD;  [facility]: [facility] OR;  Service: Vascular;;   BACK SURGERY     CARDIAC CATHETERIZATION  1980s; 2012;   CATARACT EXTRACTION Left 2004   COLONOSCOPY  2004   FLEISHMAN TICS   COLONOSCOPY WITH PROPOFOL N/A 11/28/2018   Procedure: COLONOSCOPY WITH PROPOFOL;  Surgeon: Malissa Hippo, MD;  [facility];  Service: Endoscopy;  Laterality: N/A;   CORONARY ANGIOPLASTY WITH STENT PLACEMENT  05/18/2017   "3 stents"   CORONARY STENT INTERVENTION N/A 05/18/2017   Procedure:  CORONARY STENT INTERVENTION;  Surgeon: Corky Crafts, MD;  Location: [facility] INVASIVE CV LAB;  Service: Cardiovascular;  Laterality: N/A;   ESOPHAGOGASTRODUODENOSCOPY (EGD) WITH PROPOFOL N/A 11/20/2017   Procedure: ESOPHAGOGASTRODUODENOSCOPY (EGD) WITH PROPOFOL;  Surgeon: Hilarie Fredrickson, MD;  Location: [facility] ENDOSCOPY;  Service: Gastroenterology;  Laterality: N/A;   ESOPHAGOGASTRODUODENOSCOPY (EGD) WITH PROPOFOL N/A 11/28/2018   Procedure: ESOPHAGOGASTRODUODENOSCOPY (EGD) WITH PROPOFOL;  Surgeon: Malissa Hippo, MD;  [facility];  Service: Endoscopy;  Laterality: N/A;   ESOPHAGOGASTRODUODENOSCOPY (EGD) WITH PROPOFOL N/A 07/12/2019   Procedure: ESOPHAGOGASTRODUODENOSCOPY (EGD) WITH PROPOFOL;  Surgeon: Malissa Hippo, MD;  [facility];  Service: Endoscopy;  Laterality: N/A;  125   GIVENS CAPSULE STUDY N/A 12/17/2018   Procedure: GIVENS CAPSULE STUDY;  Surgeon: Malissa Hippo, MD;  [facility];  Service: Endoscopy;  Laterality: N/A;  7:30am   I & D EXTREMITY Right 12/17/2017   Procedure: IRRIGATION AND DEBRIDEMENT RIGHT GREAT TOE;  Surgeon: Nada Libman, MD;  Location: [facility]  OR;  Service: Vascular;  Laterality: Right;   INCISION AND DRAINAGE Right 12/17/2020   Procedure: INCISION AND DRAINAGE OF RIGHT FOOT;  Surgeon: Nada Libman, MD;  [facility];  Service: Vascular;  Laterality: Right;   INTRAOPERATIVE TRANSTHORACIC ECHOCARDIOGRAM N/A 02/07/2023   Procedure: INTRAOPERATIVE TRANSTHORACIC ECHOCARDIOGRAM;  Surgeon: Tonny Bollman, MD;  Location: [facility] INVASIVE CV LAB;  Service: Cardiovascular;  Laterality: N/A;   JOINT REPLACEMENT     LEFT HEART CATH AND CORONARY ANGIOGRAPHY N/A 05/18/2017   Procedure: LEFT HEART CATH AND CORONARY ANGIOGRAPHY;  Surgeon: Corky Crafts, MD;  Location: [facility] INVASIVE CV LAB;  Service: Cardiovascular;  Laterality: N/A;   LOWER EXTREMITY ANGIOGRAM Right 09/20/2017   Procedure: RIGHT LOWER LEG  ANGIOGRAM;  Surgeon: Nada Libman,  MD;  [facility];  Service: Vascular;  Laterality: Right;   LUMBAR FUSION  2002   L3, 4 L4, 5 L5 S1 Fused by Dr. Trey Sailors   PERIPHERAL VASCULAR BALLOON ANGIOPLASTY Left 09/15/2017   Procedure: PERIPHERAL VASCULAR BALLOON ANGIOPLASTY;  Surgeon: Nada Libman, MD;  [facility];  Service: Cardiovascular;  Laterality: Left;  PTA of Peroneal & Posterior Tibial   PERIPHERAL VASCULAR INTERVENTION Right 12/15/2020   Procedure: PERIPHERAL VASCULAR INTERVENTION;  Surgeon: Nada Libman, MD;  [facility];  Service: Cardiovascular;  Laterality: Right;  SFA   POLYPECTOMY  11/28/2018   Procedure: POLYPECTOMY;  Surgeon: Malissa Hippo, MD;  [facility];  Service: Endoscopy;;  duodenum   POSTERIOR LUMBAR FUSION     RHINOPLASTY     RIGHT HEART CATH N/A 05/18/2017   Procedure: RIGHT HEART CATH;  Surgeon: Corky Crafts, MD;  Location: [facility] INVASIVE CV LAB;  Service: Cardiovascular;  Laterality: N/A;   RIGHT/LEFT HEART CATH AND CORONARY ANGIOGRAPHY N/A 12/16/2022   Procedure: RIGHT/LEFT HEART CATH AND CORONARY ANGIOGRAPHY;  Surgeon: Tonny Bollman, MD;  Location: [facility] INVASIVE CV LAB;  Service: Cardiovascular;  Laterality: N/A;   TEE WITHOUT CARDIOVERSION N/A 09/18/2017   Procedure: TRANSESOPHAGEAL ECHOCARDIOGRAM (TEE);  Surgeon: Pricilla Riffle, MD;  Location: [facility] ENDOSCOPY;  Service: Cardiovascular;  Laterality: N/A;   TOTAL KNEE ARTHROPLASTY Bilateral    TRANSMETATARSAL AMPUTATION Right 02/04/2020   Procedure: RIGHT TRANSMETATARSAL AMPUTATION;  Surgeon: Nada Libman, MD;  Location: [facility] OR;  Service: Vascular;  Laterality: Right;   TRANSMETATARSAL AMPUTATION Left 11/12/2021   Procedure: LEFT TRANSMETATARSAL AMPUTATION;  Surgeon: Maeola Harman, MD;  Location: [facility] OR;  Service: Vascular;  Laterality: Left;   TRANSURETHRAL RESECTION OF PROSTATE  2001   Krishnan   HPI:  Pt is an 89 y.o. male s/p transcatheter aortic valve replacement in the  treatment of severe aortic stenosis. PMH significant for DMII, HLD, HTN, COPD, OSA, CKD IIIa, chronic anemia, chronic afib, CAD, chronic diastolic CHF. Patient had an episode of AMS post procedure. MRI difficult due to constant motion but what imaging could be obtained was negative for CVA.   Assessment / Plan / Recommendation Clinical Impression  Cognitive-linguistic evaluation complete with patient scoring [facility] for all tasks assessed. Patient and spouse feel that he has returned to baseline from a cognitive-linguistic standpoint. No SLP f/u indicated.    SLP Assessment  SLP Recommendation/Assessment: Patient does not need any further Speech Lanaguage Pathology Services SLP Visit Diagnosis: Cognitive communication deficit (R41.841)    Recommendations for follow up therapy are one component of a multi-disciplinary discharge planning process, led by the attending physician.  Recommendations may be updated based  on patient status, additional functional criteria and insurance authorization.    Follow Up Recommendations  No SLP follow up                SLP Evaluation Cognition  Overall Cognitive Status: Within Functional Limits for tasks assessed       Comprehension  Auditory Comprehension Overall Auditory Comprehension: Appears within functional limits for tasks assessed Visual Recognition/Discrimination Discrimination: Within Function Limits Reading Comprehension Reading Status: Within funtional limits (visually impaired)    Expression Expression Primary Mode of Expression: Verbal Verbal Expression Overall Verbal Expression: Appears within functional limits for tasks assessed   Oral / Motor  Oral Motor/Sensory Function Overall Oral Motor/Sensory Function: Within functional limits Motor Speech Overall Motor Speech: Appears within functional limits for tasks assessed           Ferdinand Lango MA, CCC-SLP  Masaye Gatchalian Meryl 02/09/2023, 12:35 PM

## 2023-02-09 NOTE — TOC Transition Note (Signed)
Transition of Care (TOC) - CM/SW Discharge Note Donn Pierini RN, BSN Transitions of Care Unit 4E- RN Case Manager See Treatment Team for direct phone #   Patient Details  Name: [name] MRN: [id number] Date of Birth: [birth year]  Transition of Care [facility]) CM/SW Contact:  Darrold Span, RN Phone Number: 02/09/2023, 11:40 AM   Clinical Narrative:    Pt stable for transition home today, Wife at bedside. Order placed for HHPTOT  CM in to speak with pt and wife regarding HH needs. List provided for [facility] choice Per CMS guidelines from [email or web address] website with star ratings (copy placed in shadow chart), pt voiced he has used [facility] and [facility] in past. Would like to use [facility] again- CM will reach out to liaison for [facility].   Address, phone # and PCP all confirmed- Ride will be here around noon to transport pt/wife home.   Per pt he has needed DME at home- no new needs at this time.   Call made to [facility] for [facility] referral- referral has been accepted for HHPT/OT- they will call to schedule start of care visit.    Final next level of care: Home w Home Health Services Barriers to Discharge: No Barriers Identified   Patient Goals and CMS Choice CMS Medicare.gov Compare Post Acute Care list provided to:: Patient Choice offered to / list presented to : Patient, Spouse  Discharge Placement                 Home w/ [facility]        Discharge Plan and Services Additional resources added to the After Visit Summary for     Discharge Planning Services: CM Consult Post Acute Care Choice: Home Health          DME Arranged: N/A DME Agency: NA       HH Arranged: PT, OT HH Agency: [facility] Date [facility] Contacted: 02/09/23 Time HH Agency Contacted: 1030 Representative spoke with at [facility] Agency: Kandee Keen  Social Determinants of Health (SDOH) Interventions SDOH Screenings   Food Insecurity: No Food Insecurity (08/29/2022)  Housing: Low Risk  (01/19/2023)   Transportation Needs: No Transportation Needs (01/19/2023)  Utilities: Not At Risk (01/06/2023)  Depression (PHQ2-9): Low Risk  (08/29/2022)  Financial Resource Strain: Low Risk  (12/15/2022)  Physical Activity: Inactive (08/03/2022)  Stress: No Stress Concern Present (07/29/2019)  Tobacco Use: Medium Risk (02/07/2023)  Health Literacy: Adequate Health Literacy (01/06/2023)     Readmission Risk Interventions    02/09/2023   11:40 AM 11/15/2021   10:56 AM 12/18/2020    2:00 PM  Readmission Risk Prevention Plan  Transportation Screening Complete Complete Complete  PCP or Specialist Appt within 5-7 Days   Complete  PCP or Specialist Appt within 3-5 Days  Complete   Home Care Screening   Complete  Medication Review (RN CM)   Complete  HRI or Home Care Consult Complete Complete   Social Work Consult for Recovery Care Planning/Counseling Complete Complete   Palliative Care Screening Not Applicable Complete   Medication Review Oceanographer) Complete Complete

## 2023-02-09 NOTE — Progress Notes (Signed)
Discharge instructions have been given to patient and family to include follow up appointments and medications. Patient verbalizes understanding of instructions.

## 2023-02-09 NOTE — Plan of Care (Signed)
EEG was within normal limits.   No changes to recommendations documented in 11/12 Neurology consult note  Neurohospitalist service will sign off. Please call if there are additional questions.   Electronically signed: Dr. Caryl Pina

## 2023-02-09 NOTE — Progress Notes (Signed)
Physical Therapy Treatment Patient Details Name: Mike Wright MRN: [id number] DOB: Jan 04, 1934 Today's Date: 02/09/2023   History of Present Illness Pt is an 89 y.o. male s/p transcatheter aortic valve replacement in the treatment of severe aortic stenosis. PMH significant for DMII, HLD, HTN, COPD, OSA, CKD IIIa, chronic anemia, chronic afib, CAD, chronic diastolic CHF.    PT Comments  Pt is progressing towards goals. Pt was Mod I for bed mobility, sit to stand and gait this session with rollator; pt uses a trirollator at home. Pt continues to have difficulty with poor endurance and activity tolerance and would benefit from skilled physical therapy services 3x/weekly on discharge from acute care hospital setting in order to address activity tolerance, endurance and overall strength in order to decrease risk for falls, injury and re-hospitalization. Pt tolerated treatment session well.     If plan is discharge home, recommend the following: Assistance with cooking/housework;Help with stairs or ramp for entrance;Assist for transportation     Equipment Recommendations  None recommended by PT       Precautions / Restrictions Precautions Precautions: Fall Precaution Comments: falls frequently, ~3 falls in the last 3 months Restrictions Weight Bearing Restrictions: No     Mobility  Bed Mobility Overal bed mobility: Modified Independent Bed Mobility: Supine to Sit, Sit to Supine     Supine to sit: Modified independent (Device/Increase time) Sit to supine: Modified independent (Device/Increase time)        Transfers Overall transfer level: Modified independent Equipment used: Rollator (4 wheels) Transfers: Sit to/from Stand Sit to Stand: Modified independent (Device/Increase time)    Ambulation/Gait Ambulation/Gait assistance: Modified independent (Device/Increase time) Gait Distance (Feet): 100 Feet Assistive device: Rollator (4 wheels) Gait Pattern/deviations: Step-through  pattern, Decreased step length - right, Decreased step length - left, Decreased stride length Gait velocity: slightly decreased pt gets slightly short of breathe after gait. Gait velocity interpretation: 1.31 - 2.62 ft/sec, indicative of limited community ambulator       Stairs Stairs:  (not applicable pt has a ramp)               Balance Overall balance assessment: Modified Independent Sitting-balance support: Bilateral upper extremity supported, Single extremity supported, No upper extremity supported Sitting balance-Leahy Scale: Normal       Standing balance-Leahy Scale: Good          Cognition Arousal: Alert Behavior During Therapy: WFL for tasks assessed/performed Overall Cognitive Status: Within Functional Limits for tasks assessed                 Pertinent Vitals/Pain Pain Assessment Pain Assessment: No/denies pain     PT Goals (current goals can now be found in the care plan section) Acute Rehab PT Goals Patient Stated Goal: return home and improve mobility PT Goal Formulation: With patient/family Time For Goal Achievement: 02/22/23 Potential to Achieve Goals: Good Progress towards PT goals: Progressing toward goals    Frequency    Min 1X/week      PT Plan  Continue with current POC       AM-PAC PT "6 Clicks" Mobility   Outcome Measure  Help needed turning from your back to your side while in a flat bed without using bedrails?: None Help needed moving from lying on your back to sitting on the side of a flat bed without using bedrails?: None Help needed moving to and from a bed to a chair (including a wheelchair)?: None Help needed standing up from a chair using your  arms (e.g., wheelchair or bedside chair)?: None Help needed to walk in hospital room?: None Help needed climbing 3-5 steps with a railing? : A Little 6 Click Score: 23    End of Session Equipment Utilized During Treatment: Gait belt Activity Tolerance: Patient tolerated  treatment well Patient left: in bed;with call bell/phone within reach;with family/visitor present Nurse Communication: Mobility status PT Visit Diagnosis: Muscle weakness (generalized) (M62.81);Difficulty in walking, not elsewhere classified (R26.2);Other abnormalities of gait and mobility (R26.89);Other symptoms and signs involving the nervous system (R29.898)     Time: 4098-1191 PT Time Calculation (min) (ACUTE ONLY): 25 min  Charges:    $Therapeutic Activity: 23-37 mins PT General Charges $$ ACUTE PT VISIT: 1 Visit                    Harrel Carina, DPT, CLT  Acute Rehabilitation Services Office: [phone] (Secure chat preferred)    Claudia Desanctis 02/09/2023, 10:55 AM

## 2023-02-10 ENCOUNTER — Telehealth: Payer: Self-pay | Admitting: Physician Assistant

## 2023-02-10 NOTE — Telephone Encounter (Signed)
  [facility]   MULTIDISCIPLINARY HEART VALVE TEAM  Attempted TOC call. Left VM.   Promiss Labarbera PA-C  [facility]     

## 2023-02-10 NOTE — Telephone Encounter (Signed)
Attempted TOC call. Left voicemail.

## 2023-02-13 NOTE — Telephone Encounter (Signed)
Patient contacted regarding discharge from [facility] on 02/09/2023.  Patient understands to follow up with provider Carlean Jews PA-C on 02/17/2023 at 11:05 AM at [facility] office. Patient understands discharge instructions? yes Patient understands medications and regiment? yes Patient understands to bring all medications to this visit? Yes  The pt is doing well since discharge and has no complaints.

## 2023-02-14 ENCOUNTER — Encounter: Payer: Self-pay | Admitting: *Deleted

## 2023-02-14 ENCOUNTER — Ambulatory Visit: Payer: Self-pay | Admitting: *Deleted

## 2023-02-14 NOTE — Patient Outreach (Signed)
  Care Coordination   Follow Up Visit Note   02/14/2023 Name: Mike Wright MRN: [id number] DOB: 08-21-1933  Mike Wright is a 89 y.o. year old male who sees [name], MD for primary care. I spoke with  Mike Wright by phone today.  What matters to the patients health and wellness today?  Ongoing improvement after having aortic valve replacement.    Goals Addressed             This Visit's Progress    Manage Aortic Valve Stenosis   On track    Care Coordination Goals: Patient will keep all medical appointments Patient will reach out to provider with any new or worsening symptoms Patient will increase activity level as tolerated Patient will reach out to RN Care Management Coordinator [phone] with any resource or care coordination needs        SDOH assessments and interventions completed:  Yes  SDOH Interventions Today    Flowsheet Row Most Recent Value  SDOH Interventions   Transportation Interventions Intervention Not Indicated  Financial Strain Interventions Intervention Not Indicated        Care Coordination Interventions:  Yes, provided  Interventions Today    Flowsheet Row Most Recent Value  Chronic Disease   Chronic disease during today's visit Other  [Aortic Valve Stenosis]  General Interventions   General Interventions Discussed/Reviewed General Interventions Discussed, General Interventions Reviewed, Doctor Visits  [Patient feels well since having aortic valve replacement. He's still a little tired, but that is improving.]  Doctor Visits Discussed/Reviewed Doctor Visits Discussed, Doctor Visits Reviewed, Specialist  [reviewed and discussed valve replacement on 02/07/23 to correct aortic valve stenosis]  PCP/Specialist Visits Compliance with follow-up visit  [02/17/23 post procedure F/U with cardiovascular department on [facility], Sharlene Dory, NP (cardiology) on 03/02/23]  Exercise Interventions   Exercise Discussed/Reviewed Exercise  Discussed, Exercise Reviewed, Physical Activity  Physical Activity Discussed/Reviewed Physical Activity Reviewed, Physical Activity Discussed  [Encouraged to increase physical activity as tolerated]  Education Interventions   Education Provided Provided Education  Provided Verbal Education On Exercise, Medication, When to see the doctor  Mental Health Interventions   Mental Health Discussed/Reviewed Mental Health Discussed, Mental Health Reviewed  [No concerns at this time]  Pharmacy Interventions   Pharmacy Dicussed/Reviewed Pharmacy Topics Discussed, Pharmacy Topics Reviewed, Medications and their functions  [taking medications as prescribed]  Safety Interventions   Safety Discussed/Reviewed Safety Discussed, Safety Reviewed, Fall Risk, Home Safety  Home Safety Assistive Devices  Advanced Directive Interventions   Advanced Directives Discussed/Reviewed Advanced Directives Reviewed, Advanced Directives Discussed       Follow up plan: Follow up call scheduled for 03/20/23    Encounter Outcome:  Patient Visit Completed   Demetrios Loll, RN, BSN Care Management Coordinator [facility]  [facility] Direct Dial: [phone] Main #: [phone]

## 2023-02-15 ENCOUNTER — Ambulatory Visit: Payer: Medicare HMO

## 2023-02-16 NOTE — Progress Notes (Signed)
[facility]   MULTIDISCIPLINARY HEART VALVE CLINIC                                     Cardiology Office Note:    Date:  02/17/2023   ID:  ISABELLA LEATHEM, DOB 27-Jun-1933, MRN [id number]  PCP:  [name], MD  [facility] Cardiologist:  Dina Rich, MD / Dr. Excell Seltzer, MD & Dr. Laneta Simmers, MD (TAVR)  [facility] Electrophysiologist:  None   Referring MD: [name], MD   [facility] s/p TAVR  History of Present Illness:    EHAB LEANDRO is a 89 y.o. male with a hx of ABDISHAKUR RYMAN is a 89 y.o. male with a complex medical history of CAD s/p PCI to D1/mLAD/pLAD 2019, HFpEF with 07/2022 admission, COPD home O2 PRN, persistent Afib on eliquis, HIT, PAD with previous toe amputations, balance issues, neuropathy, CKD stage IIIa, chronic anemia/thrombocytopenia, history of encephalopathy, T2DM, HTN, HLD, OSA not on CPAP and severe AS s/p TAVR (02/07/23) who presents to clinic for follow up.  Mr. Ehlke is followed by Dr. Wyline Mood for his cardiology care. He was admitted from 4/22-07/27/22 for acute CHF. He had issues with encephalopathy and sundowning. He was also seen by palliative care during this admission. He was discharge on 3L of home oxygen. He has had progressive aortic stenosis. Echo 07/19/22 showed EF 70% and severe aortic stenosis with a mean gradient at 52 mmHg, AVA 0.80 cm2, and mod-severe TR. [facility] 12/16/22 showed continued patency of the stented segments in the LAD and diagonal, nonobstructive plaquing in the circumflex and RCA, and no high-grade stenoses.    S/p TAVR with a 26 mm Edwards Sapien 3 THV via the TF approach on 02/07/23. Post operative echo showed EF 65%, mod RVE, normally functioning TAVR with a mean gradient of 9.5 mmHg and no PVL as well as mod MAC and mild MS. Patient was unresponsive after TAVR for a prolonged period of time. Imaging negative for acute CVA and EEG was negative. He returned to a baseline status. Continued on home Eliquis 5mg  BID and Plavix  75mg  daily. Discharged with HHPT.   Today the patient presents to clinic for follow up. Here with wife and daughter. Still has some chronic SOB that is unchanged and chest pain requiring nitroglycerin. No increase in severity or frequency. No LE edema, orthopnea or PND. No dizziness or syncope. No blood in stool or urine. No palpitations.    Past Medical History:  Diagnosis Date   Anemia    a. mild, noted 04/2017.   CAD in native artery    a. [place] 04/2017 s/p DES to D1, DES to prox-mid LAD, DES to prox LAD overlapping the prior stent, LVEF 55-65%.    Chronic a-fib (HCC)    Chronic diastolic CHF (congestive heart failure) (HCC)    Constipation    COPD (chronic obstructive pulmonary disease) (HCC)    Diabetic ulcer of toe (HCC)    DJD (degenerative joint disease) of cervical spine    Essential hypertension    GERD (gastroesophageal reflux disease)    History of hiatal hernia    HIT (heparin-induced thrombocytopenia) (HCC)    Hypothyroidism    Hypoxia    a. went home on home O2 04/2017.   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 Excell Seltzer & Dr. Laneta Simmers   Sinus bradycardia    a. HR 30s-40s in 04/2017 -> diltiazem stopped, metoprolol reduced.   Sleep apnea    "chose not to order CPAP at this time" (05/18/2017)   Type 2 diabetes mellitus (HCC)    Wears glasses      Current Medications: Current Meds  Medication Sig   Accu-Chek Softclix Lancets lancets    acetaminophen (TYLENOL) 500 MG tablet Take 1 tablet (500 mg total) by mouth every 6 (six) hours as needed for mild pain or headache. (Patient taking differently: Take 1,000 mg by mouth every 8 (eight) hours as needed for mild pain (pain score 1-3), moderate pain (pain score 4-6) or headache.)    Alcohol Swabs (B-D SINGLE USE SWABS REGULAR) PADS    amLODipine (NORVASC) 2.5 MG tablet TAKE 1 TABLET EVERY DAY   amoxicillin (AMOXIL) 500 MG capsule Take 4 capsules (2,000 mg total) one hour prior to dental work   apixaban (ELIQUIS) 2.5 MG TABS tablet Take 2.5 mg by mouth 2 (two) times daily.   atorvastatin (LIPITOR) 40 MG tablet Take 40 mg by mouth daily at 6 PM.   Blood Glucose Monitoring Suppl (ACCU-CHEK GUIDE) w/Device KIT    Cholecalciferol (VITAMIN D3) 50 MCG (2000 UT) TABS Take 2,000 Units by mouth every evening.   clopidogrel (PLAVIX) 75 MG tablet Take 1 tablet (75 mg total) by mouth daily with breakfast.   Cyanocobalamin (B-12) 1000 MCG TABS Take 2,000 mcg by mouth in the morning.   diazepam (VALIUM) 2 MG tablet Take 2 mg by mouth daily as needed for anxiety.   DROPLET INSULIN SYRINGE 31G X 5/16" 1 ML MISC    folic acid (FOLVITE) 800 MCG tablet Take 400 mcg by mouth at bedtime.   insulin glargine (LANTUS) 100 UNIT/ML injection Inject 30 Units into the skin 2 (two) times daily.   ipratropium-albuterol (DUONEB) 0.5-2.5 (3) MG/3ML SOLN Take 3 mLs by nebulization every 6 (six) hours as needed (wheezing/shortness of breath).   isosorbide mononitrate (IMDUR) 60 MG 24 hr tablet Take 1.5 tablets (90 mg total) by mouth in the morning and at bedtime.   nitroGLYCERIN (NITROSTAT) 0.4 MG SL tablet PLACE 1 TABLET (0.4 MG TOTAL) UNDER THE TONGUE EVERY 5 (FIVE) MINUTES AS NEEDED FOR CHEST PAIN.   OXYGEN Inhale 2 L/min into the lungs See admin instructions. 2 L/min of oxygen at bedtime and during the day as needed for shortness of breath   pantoprazole (PROTONIX) 40 MG tablet Take 40 mg by mouth 2 (two) times daily.   potassium chloride SA (KLOR-CON M) 20 MEQ tablet Take 1 tablet (20 mEq total) by mouth 2 (two) times daily.   psyllium (REGULOID) 0.52 g capsule Take 0.52 g by mouth 2 (two) times daily.   SPIRIVA HANDIHALER 18 MCG inhalation capsule Place 1 capsule into inhaler and inhale daily after  breakfast.    torsemide (DEMADEX) 20 MG tablet TAKE 1 TABLET TWICE DAILY   traZODone (DESYREL) 50 MG tablet Take 100 mg by mouth at bedtime.   TRUE METRIX BLOOD GLUCOSE TEST test strip    [DISCONTINUED] isosorbide mononitrate (IMDUR) 60 MG 24 hr tablet TAKE 1 TABLET TWICE DAILY (DOSE INCREASE 10/26/21)      ROS:   Please see the history of present  illness.    All other systems reviewed and are negative.  EKGs   EKG Interpretation Date/Time:  Friday February 17 2023 11:14:25 EST Ventricular Rate:  57 PR Interval:    QRS Duration:  90 QT Interval:  448 QTC Calculation: 436 R Axis:   -20  Text Interpretation: Atrial fibrillation with slow ventricular response Confirmed by Cline Crock [phone]) on 02/17/2023 11:18:04 AM    Risk Assessment/Calculations:    CHA2DS2-VASc Score = 6   This indicates a 9.7% annual risk of stroke. The patient's score is based upon: CHF History: 1 HTN History: 1 Diabetes History: 1 Stroke History: 0 Vascular Disease History: 1 Age Score: 2 Gender Score: 0          Physical Exam:    VS:  BP 132/62   Pulse (!) 58   Ht 5\' 7"  (1.702 m)   Wt 174 lb 6.4 oz (79.1 kg)   SpO2 96%   BMI 27.31 kg/m     Wt Readings from Last 3 Encounters:  02/17/23 174 lb 6.4 oz (79.1 kg)  02/09/23 176 lb 12.9 oz (80.2 kg)  01/18/23 170 lb 12.8 oz (77.5 kg)     GEN: Well nourished, well developed in no acute distress NECK: No JVD CARDIAC: irreg irreg, no murmurs, rubs, gallops RESPIRATORY:  Clear to auscultation without rales, wheezing or rhonchi  ABDOMEN: Soft, non-tender, non-distended EXTREMITIES:  No edema; No deformity .  Groin sites clear without hematoma or ecchymosis    ASSESSMENT:    1. S/P TAVR (transcatheter aortic valve replacement)   2. Coronary artery disease involving native heart without angina pectoris, unspecified vessel or lesion type   3. Chronic heart failure with preserved ejection fraction (HCC)   4. Permanent atrial  fibrillation (HCC)   5. CKD stage 3a, GFR 45-59 ml/min (HCC)   6. Microcytic anemia   7. PAD (peripheral artery disease) (HCC)   8. Essential hypertension   9. Lesion of pancreas   10. Thyroid nodule   11. Vertebral artery thrombosis, unspecified laterality     PLAN:    In order of problems listed above:  Severe AS s/p TAVR: doing well 1 week out from TAVR. ECG with no HAVB. Groin site stable. SBE prophylaxis discussed; I have RX'd amoxicillin. Continue on home Eliquis 5mg  BID and Plavix 75mg  daily. I will see him back for 1 month echo and ov.   CAD: [facility] 12/16/22 showed continued patency of the stented segments in the LAD and diagonal, nonobstructive plaquing in the circumflex and RCA, and no high-grade stenoses. Continue medical therapy with Plavix 75mg  daily and Lipitor 40mg  daily. He has chronic stable angina- will increase imdur from 60mg  BID to 90mg  BID. Continue SL NTG.   HFpEF:  he appears euvolemic. Continue Demadex 20mg  BID and Kdur 20 meq BID.    Persistent atrial fibrillation: continue low dose Eliquis 2.5mg  BID. He is well rate controlled off AV nodal blocking agents.   CKD Stage IIIa: creat 1.54 at discharge, which is within his baseline.    Chronic anemia/thrombocytopenia: followed by heme/onc.   PAD: s/p multiple metatarsal amputations. Follows with [facility].    HTN: BP well controlled currently. No changes made except increase in Imdur as above.   Lesion of pancreas: pre TAVR CT showed a "cystic lesion of the uncinate process of the pancreas measuring 8 mm, likely a small IPMN. Recommend follow up pre and post contrast MRI/MRCP or pancreatic protocol CT in 2 years. Pancreatic calcifications, likely sequela of  chronic pancreatitis." Discussed with pt and will defer to PCP   Thyroid nodule: CTA noted a "heterogeneous and enlarged left thyroid lobe extending into the upper mediastinum. A non-emergent thyroid ultrasound is recommended for further evaluation." Discussed with  pt. Will get this set up.   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 Per neuro, "consider re-imaging with CTA of head and neck in 4-6 weeks to assess for resolution of the possible distal left vertebral artery thrombus seen on CTA. The apparent finding is likely too small for MRA to be of utility as a follow up imaging modality." Will get set up at next visit   Medication Adjustments/Labs and Tests Ordered: Current medicines are reviewed at length with the patient today.  Concerns regarding medicines are outlined above.  Orders Placed This Encounter  Procedures   US THYROID   EKG 12-Lead   Meds ordered this encounter  Medications   isosorbide mononitrate (IMDUR) 60 MG 24 hr tablet    Sig: Take 1.5 tablets (90 mg total) by mouth in the morning and at bedtime.    Dispense:  270 tablet    Refill:  3   amoxicillin (AMOXIL) 500 MG capsule    Sig: Take 4 capsules (2,000 mg total) one hour prior to dental work    Dispense:  16 capsule    Refill:  3    Patient Instructions  Medication Instructions:  Your physician has recommended you make the following change in your medication:  1) INCREASE Imdur (isosorbide) to 90 mg (1.5 tablets) twice daily  2) TAKE amoxicillin 2,000 mg (4 tablets) 1 hour prior to dental work  *If you need a refill on your cardiac medications before your next appointment, please call your pharmacy*  Testing/Procedures: Thyroid Ultrasound  Follow-Up: At [facility], you and your health needs are our priority.  As part of our continuing mission to provide you with exceptional heart care, we have created designated Provider Care Teams.  These Care Teams include your primary Cardiologist (physician) and Advanced Practice Providers (APPs -  Physician Assistants and Nurse Practitioners) who all work together to provide you with the care you need, when you need it.  Your next appointment:   As scheduled    Signed, Cline Crock, PA-C   02/17/2023 11:47 AM    [facility]

## 2023-02-17 ENCOUNTER — Ambulatory Visit: Payer: Medicare HMO | Attending: Cardiovascular Disease | Admitting: Physician Assistant

## 2023-02-17 ENCOUNTER — Ambulatory Visit: Payer: Medicare HMO

## 2023-02-17 VITALS — BP 132/62 | HR 58 | Ht 67.0 in | Wt 174.4 lb

## 2023-02-17 DIAGNOSIS — I6509 Occlusion and stenosis of unspecified vertebral artery: Secondary | ICD-10-CM

## 2023-02-17 DIAGNOSIS — I739 Peripheral vascular disease, unspecified: Secondary | ICD-10-CM | POA: Diagnosis not present

## 2023-02-17 DIAGNOSIS — I5032 Chronic diastolic (congestive) heart failure: Secondary | ICD-10-CM

## 2023-02-17 DIAGNOSIS — I4821 Permanent atrial fibrillation: Secondary | ICD-10-CM | POA: Diagnosis not present

## 2023-02-17 DIAGNOSIS — I2510 Atherosclerotic heart disease of native coronary artery without angina pectoris: Secondary | ICD-10-CM | POA: Diagnosis not present

## 2023-02-17 DIAGNOSIS — K869 Disease of pancreas, unspecified: Secondary | ICD-10-CM

## 2023-02-17 DIAGNOSIS — N1831 Chronic kidney disease, stage 3a: Secondary | ICD-10-CM | POA: Diagnosis not present

## 2023-02-17 DIAGNOSIS — D509 Iron deficiency anemia, unspecified: Secondary | ICD-10-CM | POA: Diagnosis not present

## 2023-02-17 DIAGNOSIS — I10 Essential (primary) hypertension: Secondary | ICD-10-CM

## 2023-02-17 DIAGNOSIS — Z952 Presence of prosthetic heart valve: Secondary | ICD-10-CM | POA: Diagnosis not present

## 2023-02-17 DIAGNOSIS — E041 Nontoxic single thyroid nodule: Secondary | ICD-10-CM

## 2023-02-17 MED ORDER — AMOXICILLIN 500 MG PO CAPS: ORAL_CAPSULE | ORAL | 3 refills | Status: DC

## 2023-02-17 MED ORDER — ISOSORBIDE MONONITRATE ER 60 MG PO TB24: 90.0000 mg | ORAL_TABLET | Freq: Two times a day (BID) | ORAL | 3 refills | Status: DC

## 2023-02-17 NOTE — Patient Instructions (Signed)
Medication Instructions:  Your physician has recommended you make the following change in your medication:  1) INCREASE Imdur (isosorbide) to 90 mg (1.5 tablets) twice daily  2) TAKE amoxicillin 2,000 mg (4 tablets) 1 hour prior to dental work  *If you need a refill on your cardiac medications before your next appointment, please call your pharmacy*  Testing/Procedures: Thyroid Ultrasound  Follow-Up: At [facility], you and your health needs are our priority.  As part of our continuing mission to provide you with exceptional heart care, we have created designated Provider Care Teams.  These Care Teams include your primary Cardiologist (physician) and Advanced Practice Providers (APPs -  Physician Assistants and Nurse Practitioners) who all work together to provide you with the care you need, when you need it.  Your next appointment:   As scheduled

## 2023-02-18 DIAGNOSIS — I25118 Atherosclerotic heart disease of native coronary artery with other forms of angina pectoris: Secondary | ICD-10-CM | POA: Diagnosis not present

## 2023-02-18 DIAGNOSIS — N1831 Chronic kidney disease, stage 3a: Secondary | ICD-10-CM | POA: Diagnosis not present

## 2023-02-18 DIAGNOSIS — I5033 Acute on chronic diastolic (congestive) heart failure: Secondary | ICD-10-CM | POA: Diagnosis not present

## 2023-02-18 DIAGNOSIS — D631 Anemia in chronic kidney disease: Secondary | ICD-10-CM | POA: Diagnosis not present

## 2023-02-18 DIAGNOSIS — I4819 Other persistent atrial fibrillation: Secondary | ICD-10-CM | POA: Diagnosis not present

## 2023-02-18 DIAGNOSIS — E1122 Type 2 diabetes mellitus with diabetic chronic kidney disease: Secondary | ICD-10-CM | POA: Diagnosis not present

## 2023-02-18 DIAGNOSIS — I130 Hypertensive heart and chronic kidney disease with heart failure and stage 1 through stage 4 chronic kidney disease, or unspecified chronic kidney disease: Secondary | ICD-10-CM | POA: Diagnosis not present

## 2023-02-18 DIAGNOSIS — Z48812 Encounter for surgical aftercare following surgery on the circulatory system: Secondary | ICD-10-CM | POA: Diagnosis not present

## 2023-02-18 DIAGNOSIS — I088 Other rheumatic multiple valve diseases: Secondary | ICD-10-CM | POA: Diagnosis not present

## 2023-02-27 ENCOUNTER — Ambulatory Visit (HOSPITAL_COMMUNITY)
Admission: RE | Admit: 2023-02-27 | Discharge: 2023-02-27 | Disposition: A | Discharge status: Home / Self Care | Payer: Medicare HMO | Source: Ambulatory Visit | Attending: Physician Assistant | Admitting: Physician Assistant

## 2023-02-27 DIAGNOSIS — I2510 Atherosclerotic heart disease of native coronary artery without angina pectoris: Secondary | ICD-10-CM | POA: Diagnosis not present

## 2023-02-27 DIAGNOSIS — I5032 Chronic diastolic (congestive) heart failure: Secondary | ICD-10-CM | POA: Insufficient documentation

## 2023-02-27 DIAGNOSIS — E042 Nontoxic multinodular goiter: Secondary | ICD-10-CM | POA: Diagnosis not present

## 2023-02-27 DIAGNOSIS — I6509 Occlusion and stenosis of unspecified vertebral artery: Secondary | ICD-10-CM | POA: Diagnosis present

## 2023-02-27 DIAGNOSIS — K869 Disease of pancreas, unspecified: Secondary | ICD-10-CM | POA: Diagnosis not present

## 2023-02-27 DIAGNOSIS — N1831 Chronic kidney disease, stage 3a: Secondary | ICD-10-CM | POA: Insufficient documentation

## 2023-02-27 DIAGNOSIS — I4821 Permanent atrial fibrillation: Secondary | ICD-10-CM | POA: Diagnosis not present

## 2023-02-27 DIAGNOSIS — Z952 Presence of prosthetic heart valve: Secondary | ICD-10-CM | POA: Diagnosis not present

## 2023-02-27 DIAGNOSIS — I10 Essential (primary) hypertension: Secondary | ICD-10-CM | POA: Diagnosis not present

## 2023-02-27 DIAGNOSIS — I739 Peripheral vascular disease, unspecified: Secondary | ICD-10-CM | POA: Diagnosis not present

## 2023-02-27 DIAGNOSIS — D509 Iron deficiency anemia, unspecified: Secondary | ICD-10-CM | POA: Insufficient documentation

## 2023-02-27 DIAGNOSIS — E041 Nontoxic single thyroid nodule: Secondary | ICD-10-CM | POA: Insufficient documentation

## 2023-02-28 DIAGNOSIS — I5033 Acute on chronic diastolic (congestive) heart failure: Secondary | ICD-10-CM | POA: Diagnosis not present

## 2023-02-28 DIAGNOSIS — I130 Hypertensive heart and chronic kidney disease with heart failure and stage 1 through stage 4 chronic kidney disease, or unspecified chronic kidney disease: Secondary | ICD-10-CM | POA: Diagnosis not present

## 2023-02-28 DIAGNOSIS — I25118 Atherosclerotic heart disease of native coronary artery with other forms of angina pectoris: Secondary | ICD-10-CM | POA: Diagnosis not present

## 2023-02-28 DIAGNOSIS — Z48812 Encounter for surgical aftercare following surgery on the circulatory system: Secondary | ICD-10-CM | POA: Diagnosis not present

## 2023-03-01 DIAGNOSIS — I4819 Other persistent atrial fibrillation: Secondary | ICD-10-CM | POA: Diagnosis not present

## 2023-03-01 DIAGNOSIS — Z48812 Encounter for surgical aftercare following surgery on the circulatory system: Secondary | ICD-10-CM | POA: Diagnosis not present

## 2023-03-01 DIAGNOSIS — I5033 Acute on chronic diastolic (congestive) heart failure: Secondary | ICD-10-CM | POA: Diagnosis not present

## 2023-03-01 DIAGNOSIS — I25118 Atherosclerotic heart disease of native coronary artery with other forms of angina pectoris: Secondary | ICD-10-CM | POA: Diagnosis not present

## 2023-03-01 DIAGNOSIS — I088 Other rheumatic multiple valve diseases: Secondary | ICD-10-CM | POA: Diagnosis not present

## 2023-03-01 DIAGNOSIS — N1831 Chronic kidney disease, stage 3a: Secondary | ICD-10-CM | POA: Diagnosis not present

## 2023-03-01 DIAGNOSIS — D631 Anemia in chronic kidney disease: Secondary | ICD-10-CM | POA: Diagnosis not present

## 2023-03-01 DIAGNOSIS — I130 Hypertensive heart and chronic kidney disease with heart failure and stage 1 through stage 4 chronic kidney disease, or unspecified chronic kidney disease: Secondary | ICD-10-CM | POA: Diagnosis not present

## 2023-03-01 DIAGNOSIS — E1122 Type 2 diabetes mellitus with diabetic chronic kidney disease: Secondary | ICD-10-CM | POA: Diagnosis not present

## 2023-03-02 ENCOUNTER — Ambulatory Visit: Payer: Medicare HMO | Attending: Nurse Practitioner | Admitting: Nurse Practitioner

## 2023-03-02 ENCOUNTER — Encounter: Payer: Self-pay | Admitting: Nurse Practitioner

## 2023-03-02 VITALS — BP 124/64 | HR 62 | Ht 67.0 in | Wt 176.0 lb

## 2023-03-02 DIAGNOSIS — I739 Peripheral vascular disease, unspecified: Secondary | ICD-10-CM | POA: Diagnosis not present

## 2023-03-02 DIAGNOSIS — E785 Hyperlipidemia, unspecified: Secondary | ICD-10-CM | POA: Diagnosis not present

## 2023-03-02 DIAGNOSIS — I2510 Atherosclerotic heart disease of native coronary artery without angina pectoris: Secondary | ICD-10-CM

## 2023-03-02 DIAGNOSIS — I4891 Unspecified atrial fibrillation: Secondary | ICD-10-CM

## 2023-03-02 DIAGNOSIS — I5032 Chronic diastolic (congestive) heart failure: Secondary | ICD-10-CM

## 2023-03-02 DIAGNOSIS — I10 Essential (primary) hypertension: Secondary | ICD-10-CM | POA: Diagnosis not present

## 2023-03-02 DIAGNOSIS — Z952 Presence of prosthetic heart valve: Secondary | ICD-10-CM | POA: Diagnosis not present

## 2023-03-02 DIAGNOSIS — N1831 Chronic kidney disease, stage 3a: Secondary | ICD-10-CM | POA: Diagnosis not present

## 2023-03-02 NOTE — Progress Notes (Signed)
Office Visit    Patient Name: Mike Wright Date of Encounter: 03/02/2023 PCP:  [name], MD [facility]  Cardiologist:  Dina Rich, MD  Advanced Practice Provider:  No care team member to display Electrophysiologist:  None   Chief Complaint    Mike Wright is a 89 y.o. male with a hx of CAD, HFpEF, history of A-fib, history of PSVT/palpitations, PAD (previous history of gangrene and amputations), hyperlipidemia, aortic valve stenosis, s/p TAVR, and chronic anemia, CKD, who presents today for scheduled follow-up.  Previous cardiovascular history includes nonobstructive CAD seen via cardiac catheterization 2012.  NST in 2017 was negative for ischemia.  Echocardiogram around that time revealed EF 65 to 70%.  Repeat Lexiscan in 2018 was low risk study.  Underwent cardiac catheterization in 2019, received drug-eluting stent to 75% stenosis D1, DES to 75% mid to distal LAD, DES to 70% proximal LAD.  RHC findings listed below.  Was discharged on triple therapy for 1 month, then was instructed to stop aspirin.  Was admitted later that year for anemia and chest pain.  Workup negative for ACS.  The following months, admitted with chest pain and dyspnea.  CT was negative for PE, ACS workup was negative.  Diuresed 6 L with resolution of symptoms.  Underwent NST in 2021 revealed small, mild intensity mid to basal inferior/inferoseptal defect, consistent with soft tissue attenuation, no significant ischemic territories.  Last seen by Dr. Dina Rich on July 18, 2022.  With found to be severely volume overloaded, weight was up 19 pounds since February.  Based on his symptoms, it was recommended he present to [facility] ED for further evaluation and for diuresis.  Admitted to [facility] for acute on chronic CHF exacerbation.  Did receive IV diuresis, transition to p.o. torsemide twice daily, also placed on potassium supplementation.  TTE was updated-see report  below.  CXR revealed probable mild interstitial edema.  Was referred to heart failure clinic outpatient.  Hospital course complicated by sundowning, encephalopathy, declining condition was discussed with family.  Outpatient follow-up with cardiology was arranged.  10/17/2022 - Today he presents for outpatient follow-up. Overall doing well.   Weight is down from last month. Wears 3 L O2 PRN.  Denies any chest pain or worsening shortness of breath, palpitations, syncope, presyncope, dizziness, orthopnea, PND, swelling or significant weight changes, acute bleeding, or claudication.  He is being followed by structural heart clinic, was referred to Dr. Excell Seltzer for his aortic valve stenosis.  Underwent right/left heart cath on December 16, 2022-see results below.  Recommended to continue evaluation for TAVR. CT imaging revealed tricuspid aortic valve with moderate calcifications, AV calcium score 1089, sufficient coronary to annulus distance-see report below.  January 18, 2023- Today he presents for scheduled follow-up.  He is doing well.  He tells me he is scheduled for TAVR next month on February 07, 2023.  Denies any chest pain or worsening shortness of breath, palpitations, syncope, presyncope, dizziness, orthopnea, PND, swelling or significant weight changes, acute bleeding, or claudication. Recent ABI's normal. Recent vascular ultrasound study found patent stent along right lower extremity with no evidence of stenosis in superficial femoral artery stent, no evidence of stenosis of LLE.   In the interim, he underwent TAVR with 26 mm Edwards SAPIEN 3 CVVH via the TF approach on February 07, 2023.  Postop echo revealed EF 65%, moderate RVE, normally functioning TAVR with mean gradient of 9.5 mmHg, moderate MAC and mild MS.  He  was unresponsive after TAVR for prolonged period of time, imaging was negative for acute CVA.  EEG was negative.  He returned to baseline status.  Discharged with home health PT.  Closely  followed by structural heart clinic.  03/02/2023-today presents for follow-up.  He states he is doing well and starting to feel better. Denies any chest pain, worsening shortness of breath, palpitations, syncope, presyncope, dizziness, orthopnea, PND, swelling or significant weight changes, acute bleeding, or claudication.   EKGs/Labs/Other Studies Reviewed:   The following studies were reviewed today:   EKG:  EKG is not ordered today.    Echo 01/2023:  1. The aortic valve has been repaired/replaced. Aortic valve  regurgitation is not visualized. There is a 26 mm Sapien prosthetic (TAVR) valve present in the aortic position. Procedure Date: 02/07/2023. Echo findings are consistent with normal structure  and function of the aortic valve prosthesis. Aortic valve area, by VTI  measures 2.49 cm. Aortic valve mean gradient measures 9.5 mmHg. Aortic valve Vmax measures 2.24 m/s.   2. Left ventricular ejection fraction, by estimation, is 65 to 70%. The  left ventricle has normal function. The left ventricle has no regional  wall motion abnormalities. There is mild concentric left ventricular  hypertrophy. Left ventricular diastolic  function could not be evaluated.   3. Right ventricular systolic function is mildly reduced. The right  ventricular size is moderately enlarged. Tricuspid regurgitation signal is inadequate for assessing PA pressure.   4. Left atrial size was severely dilated.   5. Right atrial size was mildly dilated.   6. The mitral valve is degenerative. Mild mitral valve regurgitation. No  evidence of mitral stenosis. Moderate mitral annular calcification.   7. The inferior vena cava is normal in size with greater than 50%  respiratory variability, suggesting right atrial pressure of 3 mmHg.  CT cardiac imaging (TAVR workup 12/2022): IMPRESSION: 1. Tricuspid aortic valve with moderate calcifications (AV calcium score 1089)   2. Aortic annulus measures 28mm x 22mm in  diameter with perimeter 78mm and area 468 mm^2. No annular or LVOT calcifications. Annular measurements suitable for delivery of 26mm Edwards Sapien 3 valve.   3.  Sufficient coronary to annulus distance.   4.  Optimum Fluoroscopic Angle for Delivery:  RAO 21 CRA 9  CT angio chest/aorta 12/29/2022:   IMPRESSION: Vascular:   1. Vascular findings and measurements pertinent to potential TAVR procedure, as detailed above. 2. Thickening and calcification of the aortic valve, compatible with reported clinical history of aortic stenosis. 3. Moderate aortoiliac atherosclerosis. Left main and 3 vessel coronary artery disease.  Nonvascular:   1. Cystic lesion of the uncinate process of the pancreas measuring 8 mm, likely a small IPMN. Recommend follow up pre and post contrast MRI/MRCP or pancreatic protocol CT in 2 years. This recommendation follows ACR consensus guidelines: Management of Incidental Pancreatic Cysts: A White Paper of the ACR Incidental Findings Committee. J Am Coll Radiol 2017;14:911-923. 2. Pancreatic calcifications, likely sequela of chronic pancreatitis. 3. Mild splenomegaly. 4. Emphysema (ICD10-J43.9).    Right/left heart cath 11/2022: 1.  Patent coronary arteries with continued patency of the stented segments in the LAD and diagonal, nonobstructive plaquing in the circumflex and RCA, and no high-grade stenoses 2.  By invasive measurement there is moderate aortic stenosis with a mean gradient of 28 mmHg 3.  Right heart hemodynamics consistent with moderate pulmonary hypertension, PA pressure 52/14 mean of 29 mmHg, wedge pressure of 13 mmHg, transpulmonary gradient 16 mmHg, PVR 3.3 Wood units.  Recommendations: CT angiography studies, continue evaluation for TAVR.  Patient appears to have moderate to severe aortic stenosis and the multidisciplinary heart valve team will review all of his data to compare echo, invasive catheter evaluation, and CTA studies.  Patient can  resume apixaban tomorrow morning at his normal dosing schedule. Cardiac monitor 08/30/2022:    3 day monitor   100% afib burden rates 31-92, avg HR 52. The lower rates were primarily in the early AM hours.   Rare ventricular ectopy in the form of isolated PVCs, couplets   No symptoms reported     Patch Wear Time:  2 days and 23 hours (2024-05-11T13:17:54-0400 to 2024-05-14T12:27:16-0400)   Atrial Fibrillation occurred continuously (100% burden), ranging from 31-92 bpm (avg of 52 bpm). Isolated VEs were rare (<1.0%), VE Couplets were rare (<1.0%), and no VE Triplets were present. MD notification criteria for Slow Atrial Fibrillation met -  report posted prior to notification per account request (VN).  Echo 06/2022:   1. Left ventricular ejection fraction, by estimation, is 70 to 75%. The  left ventricle has hyperdynamic function. The left ventricle has no  regional wall motion abnormalities. There is mild left ventricular  hypertrophy. Left ventricular diastolic  function could not be evaluated due to atrial flutter.   2. Right ventricular systolic function is normal. The right ventricular  size is normal. There is severely elevated pulmonary artery systolic  pressure. The estimated right ventricular systolic pressure is 73.1 mmHg.   3. Left atrial size was severely dilated.   4. Right atrial size was severely dilated.   5. The mitral valve is abnormal. No evidence of mitral valve  regurgitation. No evidence of mitral stenosis. Severe mitral annular  calcification.   6. The tricuspid valve is abnormal. Tricuspid valve regurgitation is mild  to moderate.   7. The aortic valve is calcified. Aortic valve regurgitation is mild.  Moderate to severe aortic valve stenosis. Aortic valve area, by VTI  measures 1.13 cm. Aortic valve mean gradient measures 33.0 mmHg. Aortic  valve Vmax measures 4.03 m/s. AV DVI 0.33.   8. The inferior vena cava is dilated in size with <50% respiratory   variability, suggesting right atrial pressure of 15 mmHg.   9. Increased flow velocities may be secondary to anemia, thyrotoxicosis,  hyperdynamic or high flow state.   Comparison(s): Changes from prior study are noted. AS worsened from  moderate (DVI 0.33, Vmax 3.46 m/sec, mean PG 25 mm Hg) in 10/2021 to  moderate-severe (DVI 0.33, Vmax 4.03 m/sec, mean PG 33 mm Hg) now.  Risk Assessment/Calculations:   CHA2DS2-VASc Score = 6  This indicates a 9.7% annual risk of stroke. The patient's score is based upon: CHF History: 1 HTN History: 1 Diabetes History: 1 Stroke History: 0 Vascular Disease History: 1 Age Score: 2 Gender Score: 0  Review of Systems    All other systems reviewed and are otherwise negative except as noted above.  Physical Exam    VS:  BP 124/64   Pulse 62   Ht 5\' 7"  (1.702 m)   Wt 176 lb (79.8 kg)   SpO2 97%   BMI 27.57 kg/m  , BMI Body mass index is 27.57 kg/m.  Wt Readings from Last 3 Encounters:  03/02/23 176 lb (79.8 kg)  02/17/23 174 lb 6.4 oz (79.1 kg)  02/09/23 176 lb 12.9 oz (80.2 kg)     GEN: Well nourished, well developed, in no acute distress, wearing chronic O2. HEENT: normal. Neck: Supple, no  JVD, carotid bruits, or masses. Cardiac: S1/S2, RRR, no murmur, no rubs, no gallops. No clubbing, cyanosis, edema.  Radials/PT 2+ and equal bilaterally.  Respiratory:  Respirations regular and unlabored, clear to auscultation bilaterally. MS: No deformity or atrophy. Skin: Pale, warm and dry, no rash. Neuro:  Strength and sensation are intact. Psych: Normal affect.  Assessment & Plan    HFpEF  Stage C, NYHA class I-II. Echo 01/2023 revealed 65-70% EF. Euvolemic and well compensated on exam. Continue current medication regimen. Low sodium diet, fluid restriction <2L, and daily weights encouraged. Educated to contact our office for weight gain of 2 lbs overnight or 5 lbs in one week.  CAD Stable with no anginal symptoms. No indication for  ischemic evaluation. Not on ASA d/t being on Eliquis. Continue Plavix, Imdur, and NTG PRN. Heart healthy diet and regular cardiovascular exercise encouraged. ED precautions discussed.   A-fib Denies any tachycardia or palpitations. HR well controlled. Not on any HR lowering medications. Continue Eliquis, denies any bleeding issues and on appropriate dosage.   PAD, HLD Normal ABI's 12/2022. Closely followed by Dr. Myra Gianotti. LDL 43 01/2023. Continue atorvastatin. Heart healthy diet and regular cardiovascular exercise encouraged.   Aortic valve stenosis, s/p TAVR Doing well post TAVR. Continue to follow-up with Structural Heart Team. SBE prophylaxis discussed, he verbalized understanding. Continue current medication regimen. Care and ED precautions discussed.   6. HTN BP stable. Discussed to monitor BP at home at least 2 hours after medications and sitting for 5-10 minutes. No change in medication regimen at this time. Heart healthy diet and regular cardiovascular exercise encouraged.   7. CKD stage 3a Most recent sCr 1.54 with eGFR at 43. Avoid nephrotoxic agents. No medication changes at this time. Continue to follow with PCP.   Disposition: Follow up in 6 months with Dina Rich, MD or APP.   Signed, Sharlene Dory, NP

## 2023-03-02 NOTE — Patient Instructions (Signed)
Medication Instructions:  Your physician recommends that you continue on your current medications as directed. Please refer to the Current Medication list given to you today.   Labwork: None today  Testing/Procedures: None today  Follow-Up: 6 months Dr.Branch  Any Other Special Instructions Will Be Listed Below (If Applicable).  If you need a refill on your cardiac medications before your next appointment, please call your pharmacy.

## 2023-03-06 DIAGNOSIS — N1831 Chronic kidney disease, stage 3a: Secondary | ICD-10-CM | POA: Diagnosis not present

## 2023-03-06 DIAGNOSIS — E1122 Type 2 diabetes mellitus with diabetic chronic kidney disease: Secondary | ICD-10-CM | POA: Diagnosis not present

## 2023-03-06 DIAGNOSIS — Z48812 Encounter for surgical aftercare following surgery on the circulatory system: Secondary | ICD-10-CM | POA: Diagnosis not present

## 2023-03-06 DIAGNOSIS — I130 Hypertensive heart and chronic kidney disease with heart failure and stage 1 through stage 4 chronic kidney disease, or unspecified chronic kidney disease: Secondary | ICD-10-CM | POA: Diagnosis not present

## 2023-03-06 DIAGNOSIS — D631 Anemia in chronic kidney disease: Secondary | ICD-10-CM | POA: Diagnosis not present

## 2023-03-06 DIAGNOSIS — I5033 Acute on chronic diastolic (congestive) heart failure: Secondary | ICD-10-CM | POA: Diagnosis not present

## 2023-03-06 DIAGNOSIS — I4819 Other persistent atrial fibrillation: Secondary | ICD-10-CM | POA: Diagnosis not present

## 2023-03-06 DIAGNOSIS — I25118 Atherosclerotic heart disease of native coronary artery with other forms of angina pectoris: Secondary | ICD-10-CM | POA: Diagnosis not present

## 2023-03-06 DIAGNOSIS — I088 Other rheumatic multiple valve diseases: Secondary | ICD-10-CM | POA: Diagnosis not present

## 2023-03-07 DIAGNOSIS — I4819 Other persistent atrial fibrillation: Secondary | ICD-10-CM | POA: Diagnosis not present

## 2023-03-07 DIAGNOSIS — I25118 Atherosclerotic heart disease of native coronary artery with other forms of angina pectoris: Secondary | ICD-10-CM | POA: Diagnosis not present

## 2023-03-07 DIAGNOSIS — I5033 Acute on chronic diastolic (congestive) heart failure: Secondary | ICD-10-CM | POA: Diagnosis not present

## 2023-03-07 DIAGNOSIS — I130 Hypertensive heart and chronic kidney disease with heart failure and stage 1 through stage 4 chronic kidney disease, or unspecified chronic kidney disease: Secondary | ICD-10-CM | POA: Diagnosis not present

## 2023-03-07 DIAGNOSIS — D631 Anemia in chronic kidney disease: Secondary | ICD-10-CM | POA: Diagnosis not present

## 2023-03-07 DIAGNOSIS — E1122 Type 2 diabetes mellitus with diabetic chronic kidney disease: Secondary | ICD-10-CM | POA: Diagnosis not present

## 2023-03-07 DIAGNOSIS — I088 Other rheumatic multiple valve diseases: Secondary | ICD-10-CM | POA: Diagnosis not present

## 2023-03-07 DIAGNOSIS — N1831 Chronic kidney disease, stage 3a: Secondary | ICD-10-CM | POA: Diagnosis not present

## 2023-03-07 DIAGNOSIS — Z48812 Encounter for surgical aftercare following surgery on the circulatory system: Secondary | ICD-10-CM | POA: Diagnosis not present

## 2023-03-09 DIAGNOSIS — I130 Hypertensive heart and chronic kidney disease with heart failure and stage 1 through stage 4 chronic kidney disease, or unspecified chronic kidney disease: Secondary | ICD-10-CM | POA: Diagnosis not present

## 2023-03-09 DIAGNOSIS — I088 Other rheumatic multiple valve diseases: Secondary | ICD-10-CM | POA: Diagnosis not present

## 2023-03-09 DIAGNOSIS — I4819 Other persistent atrial fibrillation: Secondary | ICD-10-CM | POA: Diagnosis not present

## 2023-03-09 DIAGNOSIS — D631 Anemia in chronic kidney disease: Secondary | ICD-10-CM | POA: Diagnosis not present

## 2023-03-09 DIAGNOSIS — I25118 Atherosclerotic heart disease of native coronary artery with other forms of angina pectoris: Secondary | ICD-10-CM | POA: Diagnosis not present

## 2023-03-09 DIAGNOSIS — Z48812 Encounter for surgical aftercare following surgery on the circulatory system: Secondary | ICD-10-CM | POA: Diagnosis not present

## 2023-03-09 DIAGNOSIS — N1831 Chronic kidney disease, stage 3a: Secondary | ICD-10-CM | POA: Diagnosis not present

## 2023-03-09 DIAGNOSIS — E1122 Type 2 diabetes mellitus with diabetic chronic kidney disease: Secondary | ICD-10-CM | POA: Diagnosis not present

## 2023-03-09 DIAGNOSIS — I5033 Acute on chronic diastolic (congestive) heart failure: Secondary | ICD-10-CM | POA: Diagnosis not present

## 2023-03-14 DIAGNOSIS — N1831 Chronic kidney disease, stage 3a: Secondary | ICD-10-CM | POA: Diagnosis not present

## 2023-03-14 DIAGNOSIS — Z48812 Encounter for surgical aftercare following surgery on the circulatory system: Secondary | ICD-10-CM | POA: Diagnosis not present

## 2023-03-14 DIAGNOSIS — I4819 Other persistent atrial fibrillation: Secondary | ICD-10-CM | POA: Diagnosis not present

## 2023-03-14 DIAGNOSIS — I088 Other rheumatic multiple valve diseases: Secondary | ICD-10-CM | POA: Diagnosis not present

## 2023-03-14 DIAGNOSIS — D631 Anemia in chronic kidney disease: Secondary | ICD-10-CM | POA: Diagnosis not present

## 2023-03-14 DIAGNOSIS — I25118 Atherosclerotic heart disease of native coronary artery with other forms of angina pectoris: Secondary | ICD-10-CM | POA: Diagnosis not present

## 2023-03-14 DIAGNOSIS — E1122 Type 2 diabetes mellitus with diabetic chronic kidney disease: Secondary | ICD-10-CM | POA: Diagnosis not present

## 2023-03-14 DIAGNOSIS — I130 Hypertensive heart and chronic kidney disease with heart failure and stage 1 through stage 4 chronic kidney disease, or unspecified chronic kidney disease: Secondary | ICD-10-CM | POA: Diagnosis not present

## 2023-03-14 DIAGNOSIS — I5033 Acute on chronic diastolic (congestive) heart failure: Secondary | ICD-10-CM | POA: Diagnosis not present

## 2023-03-15 ENCOUNTER — Other Ambulatory Visit (HOSPITAL_COMMUNITY): Payer: Medicare HMO

## 2023-03-15 ENCOUNTER — Ambulatory Visit: Payer: Medicare HMO

## 2023-03-16 DIAGNOSIS — I5033 Acute on chronic diastolic (congestive) heart failure: Secondary | ICD-10-CM | POA: Diagnosis not present

## 2023-03-16 DIAGNOSIS — Z48812 Encounter for surgical aftercare following surgery on the circulatory system: Secondary | ICD-10-CM | POA: Diagnosis not present

## 2023-03-16 DIAGNOSIS — N1831 Chronic kidney disease, stage 3a: Secondary | ICD-10-CM | POA: Diagnosis not present

## 2023-03-16 DIAGNOSIS — I130 Hypertensive heart and chronic kidney disease with heart failure and stage 1 through stage 4 chronic kidney disease, or unspecified chronic kidney disease: Secondary | ICD-10-CM | POA: Diagnosis not present

## 2023-03-16 DIAGNOSIS — D631 Anemia in chronic kidney disease: Secondary | ICD-10-CM | POA: Diagnosis not present

## 2023-03-16 DIAGNOSIS — I25118 Atherosclerotic heart disease of native coronary artery with other forms of angina pectoris: Secondary | ICD-10-CM | POA: Diagnosis not present

## 2023-03-16 DIAGNOSIS — I088 Other rheumatic multiple valve diseases: Secondary | ICD-10-CM | POA: Diagnosis not present

## 2023-03-16 DIAGNOSIS — I4819 Other persistent atrial fibrillation: Secondary | ICD-10-CM | POA: Diagnosis not present

## 2023-03-16 DIAGNOSIS — E1122 Type 2 diabetes mellitus with diabetic chronic kidney disease: Secondary | ICD-10-CM | POA: Diagnosis not present

## 2023-03-17 ENCOUNTER — Inpatient Hospital Stay: Payer: Medicare HMO

## 2023-03-17 ENCOUNTER — Ambulatory Visit (HOSPITAL_COMMUNITY): Payer: Medicare HMO | Attending: Cardiology

## 2023-03-17 ENCOUNTER — Ambulatory Visit: Payer: Medicare HMO | Admitting: Physician Assistant

## 2023-03-17 ENCOUNTER — Other Ambulatory Visit: Payer: Medicare HMO | Attending: Hematology

## 2023-03-17 DIAGNOSIS — I6509 Occlusion and stenosis of unspecified vertebral artery: Secondary | ICD-10-CM | POA: Diagnosis present

## 2023-03-17 DIAGNOSIS — I4821 Permanent atrial fibrillation: Secondary | ICD-10-CM | POA: Diagnosis not present

## 2023-03-17 DIAGNOSIS — Z952 Presence of prosthetic heart valve: Secondary | ICD-10-CM

## 2023-03-17 DIAGNOSIS — N1831 Chronic kidney disease, stage 3a: Secondary | ICD-10-CM | POA: Diagnosis not present

## 2023-03-17 DIAGNOSIS — I5032 Chronic diastolic (congestive) heart failure: Secondary | ICD-10-CM | POA: Diagnosis not present

## 2023-03-17 DIAGNOSIS — K869 Disease of pancreas, unspecified: Secondary | ICD-10-CM

## 2023-03-17 DIAGNOSIS — E041 Nontoxic single thyroid nodule: Secondary | ICD-10-CM | POA: Insufficient documentation

## 2023-03-17 DIAGNOSIS — I25118 Atherosclerotic heart disease of native coronary artery with other forms of angina pectoris: Secondary | ICD-10-CM

## 2023-03-17 DIAGNOSIS — I10 Essential (primary) hypertension: Secondary | ICD-10-CM | POA: Diagnosis not present

## 2023-03-17 DIAGNOSIS — I739 Peripheral vascular disease, unspecified: Secondary | ICD-10-CM

## 2023-03-17 DIAGNOSIS — D509 Iron deficiency anemia, unspecified: Secondary | ICD-10-CM | POA: Diagnosis not present

## 2023-03-17 LAB — ECHOCARDIOGRAM COMPLETE
AR max vel: 3.51 cm2
AV Area VTI: 3.46 cm2
AV Area mean vel: 3.42 cm2
AV Mean grad: 9.7 mm[Hg]
AV Peak grad: 18.7 mm[Hg]
Ao pk vel: 2.16 m/s
Calc EF: 56.5 %
MV M vel: 3.96 m/s
MV Peak grad: 62.6 mm[Hg]
Radius: 0.3 cm
S' Lateral: 2.37 cm
Single Plane A2C EF: 53.5 %
Single Plane A4C EF: 59.3 %

## 2023-03-17 NOTE — Progress Notes (Signed)
[facility]   MULTIDISCIPLINARY HEART VALVE CLINIC                                     Cardiology Office Note:    Date:  03/17/2023   ID:  Mike Wright, DOB 1933-11-07, MRN [id number]  PCP:  [name], MD  [facility] Cardiologist:  Dina Rich, MD / Dr. Excell Seltzer, MD & Dr. Laneta Simmers, MD (TAVR)  [facility] Electrophysiologist:  None   Referring MD: [name], MD   1 month s/p TAVR  History of Present Illness:    Mike Wright is a 89 y.o. male with a hx of Mike Wright is a 89 y.o. male with a complex medical history of CAD s/p PCI to D1/mLAD/pLAD 2019, HFpEF with 07/2022 admission, COPD home O2 PRN, persistent Afib on eliquis, HIT, PAD with previous toe amputations, balance issues, neuropathy, CKD stage IIIa, chronic anemia/thrombocytopenia, history of encephalopathy, T2DM, HTN, HLD, OSA not on CPAP and severe AS s/p TAVR (02/07/23) who presents to clinic for follow up.  [name] is followed by Dr. Wyline Mood for his cardiology care. He was admitted from 4/22-07/27/22 for acute CHF. He had issues with encephalopathy and sundowning. He was also seen by palliative care during this admission. He was discharge on 3L of home oxygen. He has had progressive aortic stenosis. Echo 07/19/22 showed EF 70% and severe aortic stenosis with a mean gradient at 52 mmHg, AVA 0.80 cm2, and mod-severe TR. [facility] 12/16/22 showed continued patency of the stented segments in the LAD and diagonal, nonobstructive plaquing in the circumflex and RCA, and no high-grade stenoses.    S/p TAVR with a 26 mm Edwards Sapien 3 THV via the TF approach on 02/07/23. Post operative echo showed EF 65%, mod RVE, normally functioning TAVR with a mean gradient of 9.5 mmHg and no PVL as well as mod MAC and mild MS. Patient was unresponsive after TAVR for a prolonged period of time. Imaging negative for acute CVA and EEG was negative. He returned to a baseline status. Continued on home Eliquis 5mg  BID and  Plavix 75mg  daily. Discharged with HHPT.   Today the patient presents to clinic for follow up. Here with wife and neice. Still has some chronic stable and chest pain requiring nitroglycerin. No increase in severity or frequency. No LE edema, orthopnea or PND. No dizziness or syncope. No blood in stool or urine. No palpitations.    Past Medical History:  Diagnosis Date   Anemia    a. mild, noted 04/2017.   CAD in native artery    a. [place] 04/2017 s/p DES to D1, DES to prox-mid LAD, DES to prox LAD overlapping the prior stent, LVEF 55-65%.    Chronic a-fib (HCC)    Chronic diastolic CHF (congestive heart failure) (HCC)    Constipation    COPD (chronic obstructive pulmonary disease) (HCC)    Diabetic ulcer of toe (HCC)    DJD (degenerative joint disease) of cervical spine    Essential hypertension    GERD (gastroesophageal reflux disease)    History of hiatal hernia    HIT (heparin-induced thrombocytopenia) (HCC)    Hypothyroidism    Hypoxia    a. went home on home O2 04/2017.   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 Excell Seltzer & Dr. Laneta Simmers   Sinus bradycardia    a. HR 30s-40s in 04/2017 -> diltiazem stopped, metoprolol reduced.   Sleep apnea    "chose not to order CPAP at this time" (05/18/2017)   Type 2 diabetes mellitus (HCC)    Wears glasses      Current Medications: Current Meds  Medication Sig   Accu-Chek Softclix Lancets lancets    acetaminophen (TYLENOL) 500 MG tablet Take 1 tablet (500 mg total) by mouth every 6 (six) hours as needed for mild pain or headache. (Patient taking differently: Take 1,000 mg by mouth every 8 (eight) hours as needed for mild pain (pain score 1-3), moderate pain (pain score 4-6) or headache.)   Alcohol Swabs  (B-D SINGLE USE SWABS REGULAR) PADS    amLODipine (NORVASC) 2.5 MG tablet TAKE 1 TABLET EVERY DAY   amoxicillin (AMOXIL) 500 MG capsule Take 4 capsules (2,000 mg total) one hour prior to dental work   apixaban (ELIQUIS) 2.5 MG TABS tablet Take 2.5 mg by mouth 2 (two) times daily.   atorvastatin (LIPITOR) 40 MG tablet Take 40 mg by mouth daily at 6 PM.   Blood Glucose Monitoring Suppl (ACCU-CHEK GUIDE) w/Device KIT    Cholecalciferol (VITAMIN D3) 50 MCG (2000 UT) TABS Take 2,000 Units by mouth every evening.   clopidogrel (PLAVIX) 75 MG tablet Take 1 tablet (75 mg total) by mouth daily with breakfast.   Cyanocobalamin (B-12) 1000 MCG TABS Take 2,000 mcg by mouth in the morning.   diazepam (VALIUM) 2 MG tablet Take 2 mg by mouth daily as needed for anxiety.   DROPLET INSULIN SYRINGE 31G X 5/16" 1 ML MISC    folic acid (FOLVITE) 800 MCG tablet Take 400 mcg by mouth at bedtime.   insulin glargine (LANTUS) 100 UNIT/ML injection Inject 30 Units into the skin 2 (two) times daily.   ipratropium-albuterol (DUONEB) 0.5-2.5 (3) MG/3ML SOLN Take 3 mLs by nebulization every 6 (six) hours as needed (wheezing/shortness of breath).   isosorbide mononitrate (IMDUR) 60 MG 24 hr tablet Take 1.5 tablets (90 mg total) by mouth in the morning and at bedtime.   nitroGLYCERIN (NITROSTAT) 0.4 MG SL tablet PLACE 1 TABLET (0.4 MG TOTAL) UNDER THE TONGUE EVERY 5 (FIVE) MINUTES AS NEEDED FOR CHEST PAIN.   OXYGEN Inhale 2 L/min into the lungs See admin instructions. 2 L/min of oxygen at bedtime and during the day as needed for shortness of breath   pantoprazole (PROTONIX) 40 MG tablet Take 40 mg by mouth 2 (two) times daily.   potassium chloride SA (KLOR-CON M) 20 MEQ tablet Take 1 tablet (20 mEq total) by mouth 2 (two) times daily.   psyllium (REGULOID) 0.52 g capsule Take 0.52 g by mouth 2 (two) times daily.   SPIRIVA HANDIHALER 18 MCG inhalation capsule Place 1 capsule into inhaler and inhale daily after breakfast.     torsemide (DEMADEX) 20 MG tablet TAKE 1 TABLET TWICE DAILY   traZODone (DESYREL) 50 MG tablet Take 100 mg by mouth at bedtime.   TRUE METRIX BLOOD GLUCOSE TEST test strip       ROS:   Please see the history of present illness.    All other systems reviewed and are negative.  EKGs        Risk Assessment/Calculations:  CHA2DS2-VASc Score = 6   This indicates a 9.7% annual risk of stroke. The patient's score is based upon: CHF History: 1 HTN History: 1 Diabetes History: 1 Stroke History: 0 Vascular Disease History: 1 Age Score: 2 Gender Score: 0          Physical Exam:    VS:  BP (!) 122/54 (BP Location: Left Arm, Patient Position: Sitting, Cuff Size: Normal)   Pulse (!) 56   Resp 16   Ht 5\' 7"  (1.702 m)   Wt 170 lb (77.1 kg)   SpO2 96%   BMI 26.63 kg/m     Wt Readings from Last 3 Encounters:  03/17/23 170 lb (77.1 kg)  03/02/23 176 lb (79.8 kg)  02/17/23 174 lb 6.4 oz (79.1 kg)     GEN: Well nourished, well developed in no acute distress NECK: No JVD CARDIAC: irreg irreg, no murmurs, rubs, gallops RESPIRATORY:  Clear to auscultation without rales, wheezing or rhonchi  ABDOMEN: Soft, non-tender, non-distended EXTREMITIES:  No edema; No deformity .    ASSESSMENT:    1. S/P TAVR (transcatheter aortic valve replacement)   2. Coronary artery disease of native artery of native heart with stable angina pectoris (HCC)   3. Chronic heart failure with preserved ejection fraction (HFpEF) (HCC)   4. Permanent atrial fibrillation (HCC)   5. CKD stage 3a, GFR 45-59 ml/min (HCC)   6. Microcytic anemia   7. PAD (peripheral artery disease) (HCC)   8. Essential hypertension   9. Lesion of pancreas   10. Thyroid nodule   11. Vertebral artery thrombosis, unspecified laterality      PLAN:    In order of problems listed above:  Severe AS s/p TAVR: echo today shows EF 65%, normally functioning TAVR with a mean gradient of 9.7 mm hg and no PVL and mild MR. He  has NYHA class II symptoms. SBE prophylaxis discussed; he has amoxicillin. Continue on home Eliquis 5mg  BID and Plavix 75mg  daily. I will see him back for 1 year echo and ov.   CAD: [facility] 12/16/22 showed continued patency of the stented segments in the LAD and diagonal, nonobstructive plaquing in the circumflex and RCA, and no high-grade stenoses. Continue medical therapy with Plavix 75mg  daily and Lipitor 40mg  daily. He has chronic stable angina. Imdur increased to 90mg  BID last visit. Continue SL NTG.   HFpEF:  he appears euvolemic. Continue Demadex 20mg  BID and Kdur 20 meq BID.    Persistent atrial fibrillation: continue low dose Eliquis 2.5mg  BID. He is well rate controlled off AV nodal blocking agents.   CKD Stage IIIa: creat 1.54 at discharge, which is within his baseline.    Chronic anemia/thrombocytopenia: followed by heme/onc. Hg 10.5/PLT 78 at discharge.    PAD: s/p multiple metatarsal amputations. Follows with [facility].    HTN: BP well controlled currently. No changes made.   Lesion of pancreas: pre TAVR CT showed a "cystic lesion of the uncinate process of the pancreas measuring 8 mm, likely a small IPMN. Recommend follow up pre and post contrast MRI/MRCP or pancreatic protocol CT in 2 years. Pancreatic calcifications, likely sequela of chronic pancreatitis." Discussed with pt and will defer to PCP.   Thyroid nodule: CTA noted a "heterogeneous and enlarged left thyroid lobe extending into the upper mediastinum. A non-emergent thyroid ultrasound is recommended for further evaluation." Follow up thyroid US was normal    Vertebral artery thrombus: noted on CTA head/neck. Per neuro, "consider re-imaging with CTA of head and  neck in 4-6 weeks to assess for resolution of the possible distal left vertebral artery thrombus seen on CTA. The apparent finding is likely too small for MRA to be of utility as a follow up imaging modality." Discussed with pt and family and they prefer to not get follow up  CTA.   Medication Adjustments/Labs and Tests Ordered: Current medicines are reviewed at length with the patient today.  Concerns regarding medicines are outlined above.  Orders Placed This Encounter  Procedures   ECHOCARDIOGRAM COMPLETE   No orders of the defined types were placed in this encounter.   Patient Instructions  Medication Instructions:  Your physician recommends that you continue on your current medications as directed. Please refer to the Current Medication list given to you today.  *If you need a refill on your cardiac medications before your next appointment, please call your pharmacy*   Lab Work: NONE If you have labs (blood work) drawn today and your tests are completely normal, you will receive your results only by: MyChart Message (if you have MyChart) OR A paper copy in the mail If you have any lab test that is abnormal or we need to change your treatment, we will call you to review the results.   Testing/Procedures: NONE   Follow-Up: At [facility], you and your health needs are our priority.  As part of our continuing mission to provide you with exceptional heart care, we have created designated Provider Care Teams.  These Care Teams include your primary Cardiologist (physician) and Advanced Practice Providers (APPs -  Physician Assistants and Nurse Practitioners) who all work together to provide you with the care you need, when you need it.  We recommend signing up for the patient portal called "MyChart".  Sign up information is provided on this After Visit Summary.  MyChart is used to connect with patients for Virtual Visits (Telemedicine).  Patients are able to view lab/test results, encounter notes, upcoming appointments, etc.  Non-urgent messages can be sent to your provider as well.   To learn more about what you can do with MyChart, go to [email or web address].    Your next appointment:   KEEP SCHEDULED FOLLOW-UP          Signed, Cline Crock, PA-C  03/17/2023 4:29 PM    [facility]

## 2023-03-17 NOTE — Patient Instructions (Signed)
Medication Instructions:  Your physician recommends that you continue on your current medications as directed. Please refer to the Current Medication list given to you today.  *If you need a refill on your cardiac medications before your next appointment, please call your pharmacy*   Lab Work: NONE If you have labs (blood work) drawn today and your tests are completely normal, you will receive your results only by: [place] (if you have MyChart) OR A paper copy in the mail If you have any lab test that is abnormal or we need to change your treatment, we will call you to review the results.   Testing/Procedures: NONE   Follow-Up: At [facility], you and your health needs are our priority.  As part of our continuing mission to provide you with exceptional heart care, we have created designated Provider Care Teams.  These Care Teams include your primary Cardiologist (physician) and Advanced Practice Providers (APPs -  Physician Assistants and Nurse Practitioners) who all work together to provide you with the care you need, when you need it.  We recommend signing up for the patient portal called "MyChart".  Sign up information is provided on this After Visit Summary.  MyChart is used to connect with patients for Virtual Visits (Telemedicine).  Patients are able to view lab/test results, encounter notes, upcoming appointments, etc.  Non-urgent messages can be sent to your provider as well.   To learn more about what you can do with MyChart, go to [email or web address].    Your next appointment:   KEEP SCHEDULED FOLLOW-UP

## 2023-03-20 ENCOUNTER — Encounter: Payer: Self-pay | Admitting: *Deleted

## 2023-03-20 ENCOUNTER — Ambulatory Visit: Payer: Self-pay | Admitting: *Deleted

## 2023-03-20 DIAGNOSIS — E1122 Type 2 diabetes mellitus with diabetic chronic kidney disease: Secondary | ICD-10-CM | POA: Diagnosis not present

## 2023-03-20 DIAGNOSIS — I088 Other rheumatic multiple valve diseases: Secondary | ICD-10-CM | POA: Diagnosis not present

## 2023-03-20 DIAGNOSIS — I130 Hypertensive heart and chronic kidney disease with heart failure and stage 1 through stage 4 chronic kidney disease, or unspecified chronic kidney disease: Secondary | ICD-10-CM | POA: Diagnosis not present

## 2023-03-20 DIAGNOSIS — D631 Anemia in chronic kidney disease: Secondary | ICD-10-CM | POA: Diagnosis not present

## 2023-03-20 DIAGNOSIS — Z48812 Encounter for surgical aftercare following surgery on the circulatory system: Secondary | ICD-10-CM | POA: Diagnosis not present

## 2023-03-20 DIAGNOSIS — I4819 Other persistent atrial fibrillation: Secondary | ICD-10-CM | POA: Diagnosis not present

## 2023-03-20 DIAGNOSIS — I5033 Acute on chronic diastolic (congestive) heart failure: Secondary | ICD-10-CM | POA: Diagnosis not present

## 2023-03-20 DIAGNOSIS — N1831 Chronic kidney disease, stage 3a: Secondary | ICD-10-CM | POA: Diagnosis not present

## 2023-03-20 DIAGNOSIS — I25118 Atherosclerotic heart disease of native coronary artery with other forms of angina pectoris: Secondary | ICD-10-CM | POA: Diagnosis not present

## 2023-03-20 NOTE — Patient Outreach (Signed)
Care Coordination   Follow Up Visit Note   03/20/2023 Name: Mike Wright MRN: [id number] DOB: Feb 10, 1934  Mike Wright is a 89 y.o. year old male who sees [name], MD for primary care. I spoke with  Mike Wright by phone today.  What matters to the patients health and wellness today?  Improving balance. Overall patient feels well today.    Goals Addressed             This Visit's Progress    Improve Balance and Mobility   On track    Care Coordination Goals: Patient will work with HHPT for strength and gait training Patient will continue to practice fall precautions Patient will use rolling walker for ambulation Patient will notify provider of any falls Patient will call RN Care Manager with any care coordination or resource needs [phone]     Manage Anemia   On track    Care Coordination Goals: Patient will follow-up hematologist and have labs in January as planned Patient will monitor for signs of worsening anemia and notify hematologist if he develops any Patient will seek emergency medical attention if needed Patient will notify provider of any obvious signs of bleeding Patient will take medications as prescribed Patient will reach out to RN Care Manager [phone] with any care coordination or resource needs     COMPLETED: Manage Aortic Valve Stenosis   On track    Care Coordination Goals: Patient will keep all medical appointments Patient will reach out to provider with any new or worsening symptoms Patient will increase activity level as tolerated Patient will reach out to RN Care Manager [phone] with any resource or care coordination needs  Patient had Transcatheter Aortic Valve Replacement on 02/07/23 and has had follow-up appointments. He is stable at this time and has a 6 month F/U scheduled in April 2025.     Manage COPD   On track    Care Coordination Goals: Patient will keep all follow-up PCP and/or pulmonary appointments Patient  will call provider with any new or worsening symptoms Patient will reference COPD Action Plan handout as needed Patient will remain as active as possible and rest as needed Patient will use medications as directed by PCP or pulmonologist  Patient will continue to use O2 3 L per minute per [place] via floor concentrator PRN Patient will monitor and record O2 saturation levels daily and as needed and will reach out to provider with any readings less than 93% at rest and seek medical attention for less 90% at rest Patient will call RN Care Manager [phone] with any care coordination or resource needs         SDOH assessments and interventions completed:  Yes  SDOH Interventions Today    Flowsheet Row Most Recent Value  SDOH Interventions   Housing Interventions Intervention Not Indicated  Transportation Interventions Intervention Not Indicated  Physical Activity Interventions Other (Comments)  [working with HHPT]        Care Coordination Interventions:  Yes, provided  Interventions Today    Flowsheet Row Most Recent Value  Chronic Disease   Chronic disease during today's visit Chronic Obstructive Pulmonary Disease (COPD), Other  [Anemia, unsteady gait]  General Interventions   General Interventions Discussed/Reviewed General Interventions Discussed, General Interventions Reviewed, Durable Medical Equipment (DME), Doctor Visits, Labs  Labs --  [hemoglobin and anemia panel]  Doctor Visits Discussed/Reviewed Doctor Visits Discussed, Doctor Visits Reviewed, Annual Wellness Visits, PCP, Specialist  Durable Medical Equipment (DME) Dan Humphreys, Oxygen  [  rolling walker, 3 L of oxygen]  PCP/Specialist Visits Compliance with follow-up visit  [[facility] on 04/21/23. Mike Hurt, NP (oncology) on 04/28/23. [facility] on 07/17/23. Dr Wyline Mood (cardiology) on 09/11/23]  Exercise Interventions   Exercise Discussed/Reviewed Exercise Discussed, Exercise Reviewed, Physical Activity  Physical  Activity Discussed/Reviewed Physical Activity Discussed, Physical Activity Reviewed, Types of exercise  [working with HHPT to increase strength and balance. Per patient, physical therapist advised that he will likely need to use walker from now on.]  Education Interventions   Education Provided Provided Education  Provided Verbal Education On When to see the doctor, Medication, Exercise, Labs  Nutrition Interventions   Nutrition Discussed/Reviewed Nutrition Discussed, Nutrition Reviewed, Fluid intake, Supplemental nutrition  [eat iron rich foods]  Pharmacy Interventions   Pharmacy Dicussed/Reviewed Pharmacy Topics Discussed, Pharmacy Topics Reviewed, Medications and their functions  [taking medication as prescribed]  Safety Interventions   Safety Discussed/Reviewed Safety Discussed, Safety Reviewed, Fall Risk, Home Safety  Home Safety Assistive Devices  [encouraged fall prevention strategies, encouraged use of walker for balance]       Follow up plan: Follow up call scheduled for 04/20/23    Encounter Outcome:  Patient Visit Completed   Demetrios Loll, RN, BSN Care Manager [place]  [facility]  Population Health  Direct Dial: [phone] Main #: [phone]

## 2023-03-27 DIAGNOSIS — N1831 Chronic kidney disease, stage 3a: Secondary | ICD-10-CM | POA: Diagnosis not present

## 2023-03-27 DIAGNOSIS — I5033 Acute on chronic diastolic (congestive) heart failure: Secondary | ICD-10-CM | POA: Diagnosis not present

## 2023-03-27 DIAGNOSIS — I25118 Atherosclerotic heart disease of native coronary artery with other forms of angina pectoris: Secondary | ICD-10-CM | POA: Diagnosis not present

## 2023-03-27 DIAGNOSIS — E1122 Type 2 diabetes mellitus with diabetic chronic kidney disease: Secondary | ICD-10-CM | POA: Diagnosis not present

## 2023-03-27 DIAGNOSIS — D631 Anemia in chronic kidney disease: Secondary | ICD-10-CM | POA: Diagnosis not present

## 2023-03-27 DIAGNOSIS — Z48812 Encounter for surgical aftercare following surgery on the circulatory system: Secondary | ICD-10-CM | POA: Diagnosis not present

## 2023-03-27 DIAGNOSIS — I4819 Other persistent atrial fibrillation: Secondary | ICD-10-CM | POA: Diagnosis not present

## 2023-03-27 DIAGNOSIS — I088 Other rheumatic multiple valve diseases: Secondary | ICD-10-CM | POA: Diagnosis not present

## 2023-03-27 DIAGNOSIS — I130 Hypertensive heart and chronic kidney disease with heart failure and stage 1 through stage 4 chronic kidney disease, or unspecified chronic kidney disease: Secondary | ICD-10-CM | POA: Diagnosis not present

## 2023-03-30 DIAGNOSIS — N1831 Chronic kidney disease, stage 3a: Secondary | ICD-10-CM | POA: Diagnosis not present

## 2023-03-30 DIAGNOSIS — I25118 Atherosclerotic heart disease of native coronary artery with other forms of angina pectoris: Secondary | ICD-10-CM | POA: Diagnosis not present

## 2023-03-30 DIAGNOSIS — I4819 Other persistent atrial fibrillation: Secondary | ICD-10-CM | POA: Diagnosis not present

## 2023-03-30 DIAGNOSIS — I5033 Acute on chronic diastolic (congestive) heart failure: Secondary | ICD-10-CM | POA: Diagnosis not present

## 2023-03-30 DIAGNOSIS — Z48812 Encounter for surgical aftercare following surgery on the circulatory system: Secondary | ICD-10-CM | POA: Diagnosis not present

## 2023-03-30 DIAGNOSIS — D631 Anemia in chronic kidney disease: Secondary | ICD-10-CM | POA: Diagnosis not present

## 2023-03-30 DIAGNOSIS — I088 Other rheumatic multiple valve diseases: Secondary | ICD-10-CM | POA: Diagnosis not present

## 2023-03-30 DIAGNOSIS — E1122 Type 2 diabetes mellitus with diabetic chronic kidney disease: Secondary | ICD-10-CM | POA: Diagnosis not present

## 2023-03-30 DIAGNOSIS — I130 Hypertensive heart and chronic kidney disease with heart failure and stage 1 through stage 4 chronic kidney disease, or unspecified chronic kidney disease: Secondary | ICD-10-CM | POA: Diagnosis not present

## 2023-04-04 DIAGNOSIS — I4819 Other persistent atrial fibrillation: Secondary | ICD-10-CM | POA: Diagnosis not present

## 2023-04-04 DIAGNOSIS — D631 Anemia in chronic kidney disease: Secondary | ICD-10-CM | POA: Diagnosis not present

## 2023-04-04 DIAGNOSIS — Z48812 Encounter for surgical aftercare following surgery on the circulatory system: Secondary | ICD-10-CM | POA: Diagnosis not present

## 2023-04-04 DIAGNOSIS — I25118 Atherosclerotic heart disease of native coronary artery with other forms of angina pectoris: Secondary | ICD-10-CM | POA: Diagnosis not present

## 2023-04-04 DIAGNOSIS — N1831 Chronic kidney disease, stage 3a: Secondary | ICD-10-CM | POA: Diagnosis not present

## 2023-04-04 DIAGNOSIS — I5033 Acute on chronic diastolic (congestive) heart failure: Secondary | ICD-10-CM | POA: Diagnosis not present

## 2023-04-04 DIAGNOSIS — I088 Other rheumatic multiple valve diseases: Secondary | ICD-10-CM | POA: Diagnosis not present

## 2023-04-04 DIAGNOSIS — E1122 Type 2 diabetes mellitus with diabetic chronic kidney disease: Secondary | ICD-10-CM | POA: Diagnosis not present

## 2023-04-04 DIAGNOSIS — I130 Hypertensive heart and chronic kidney disease with heart failure and stage 1 through stage 4 chronic kidney disease, or unspecified chronic kidney disease: Secondary | ICD-10-CM | POA: Diagnosis not present

## 2023-04-11 DIAGNOSIS — D631 Anemia in chronic kidney disease: Secondary | ICD-10-CM | POA: Diagnosis not present

## 2023-04-11 DIAGNOSIS — I088 Other rheumatic multiple valve diseases: Secondary | ICD-10-CM | POA: Diagnosis not present

## 2023-04-11 DIAGNOSIS — N1831 Chronic kidney disease, stage 3a: Secondary | ICD-10-CM | POA: Diagnosis not present

## 2023-04-11 DIAGNOSIS — I25118 Atherosclerotic heart disease of native coronary artery with other forms of angina pectoris: Secondary | ICD-10-CM | POA: Diagnosis not present

## 2023-04-11 DIAGNOSIS — E1122 Type 2 diabetes mellitus with diabetic chronic kidney disease: Secondary | ICD-10-CM | POA: Diagnosis not present

## 2023-04-11 DIAGNOSIS — I4819 Other persistent atrial fibrillation: Secondary | ICD-10-CM | POA: Diagnosis not present

## 2023-04-11 DIAGNOSIS — I5033 Acute on chronic diastolic (congestive) heart failure: Secondary | ICD-10-CM | POA: Diagnosis not present

## 2023-04-11 DIAGNOSIS — I130 Hypertensive heart and chronic kidney disease with heart failure and stage 1 through stage 4 chronic kidney disease, or unspecified chronic kidney disease: Secondary | ICD-10-CM | POA: Diagnosis not present

## 2023-04-11 DIAGNOSIS — Z48812 Encounter for surgical aftercare following surgery on the circulatory system: Secondary | ICD-10-CM | POA: Diagnosis not present

## 2023-04-20 ENCOUNTER — Other Ambulatory Visit: Payer: Self-pay

## 2023-04-20 ENCOUNTER — Encounter: Payer: Self-pay | Admitting: *Deleted

## 2023-04-20 ENCOUNTER — Ambulatory Visit: Payer: Self-pay | Admitting: *Deleted

## 2023-04-20 DIAGNOSIS — D500 Iron deficiency anemia secondary to blood loss (chronic): Secondary | ICD-10-CM

## 2023-04-20 NOTE — Patient Outreach (Signed)
Care Coordination   Follow Up Visit Note   04/20/2023 Name: Mike Wright MRN: [id number] DOB: [birth year]  Mike Wright is a 89 y.o. year old male who sees Mike Peri, MD for primary care. I spoke with  Mike Wright by phone today.  What matters to the patients health and wellness today?  Continuing to increase activity level and independence. He would like to start driving again.    Goals Addressed             This Visit's Progress    Improve Balance and Mobility   On track    Care Coordination Goals: Patient will continue to practice fall precautions Patient will use rolling walker for ambulation Patient will notify provider of any falls Patient will continue to increase activity level as tolerated with a goal of 150 minutes per week Patient will wean off of walker, if possible, as advised by HHPT Patient will call RN Care Manager with any care coordination or resource needs [phone]     Manage Anemia   On track    Care Coordination Goals: Patient will follow-up hematologist next week and have labs tomorrow as planned Patient will monitor for signs of worsening anemia and notify hematologist if he develops any Patient will seek emergency medical attention if needed Patient will notify provider of any obvious signs of bleeding Patient will take medications as prescribed Patient will reach out to RN Care Manager [phone] with any care coordination or resource needs     Manage COPD   On track    Care Coordination Goals: Patient will keep all follow-up PCP and/or pulmonary appointments Patient will call provider with any new or worsening symptoms Patient will reference COPD Action Plan handout as needed Patient will increase activity level as tolerated with a goal of 150 minutes per week Patient will use medications as directed by PCP or pulmonologist  Patient will continue to use O2 3 L per minute per [place] via floor concentrator PRN Patient will monitor and  record O2 saturation levels daily and as needed and will reach out to provider with any readings less than 93% at rest and seek medical attention for less 90% at rest Patient will call RN Care Manager [phone] with any care coordination or resource needs      COMPLETED: Manage Edema   On track    Care Coordination Goals: Patient will keep all medical appointments Patient will continue monitor lower extremities and abdomen for swelling/edema and report any worsening to cardiologist Patient will take Torsemide as prescribed Patient will continue to monitor and record weights daily and will reach out to cardio with any weight gain of greater than 2 lbs overnight of greater than 5 lbs in one week Patient will check and record blood pressure and reach out to provider with any readings outside of recommended range Patient will check and record O2 saturation levels and reach out to provider with any readings outside of recommended range Patient will reach out to RN Care Coordinator as needed [phone]          SDOH assessments and interventions completed:  Yes  SDOH Interventions Today    Flowsheet Row Most Recent Value  SDOH Interventions   Transportation Interventions Patient Resources (Friends/Family)  Physical Activity Interventions Other (Comments)  [has finished with HHPT but is trying to increase activity level on his own]        Care Coordination Interventions:  Yes, provided  Interventions Today  Flowsheet Row Most Recent Value  Chronic Disease   Chronic disease during today's visit Chronic Obstructive Pulmonary Disease (COPD), Other  [Anemia, Problems with balance]  General Interventions   General Interventions Discussed/Reviewed General Interventions Discussed, General Interventions Reviewed, Labs, Doctor Visits, Durable Medical Equipment (DME)  Labs --  [CBC/Anemia Panel]  Doctor Visits Discussed/Reviewed Doctor Visits Discussed, Doctor Visits Reviewed, PCP,  Specialist  Durable Medical Equipment (DME) Dan Humphreys, Oxygen  PCP/Specialist Visits Compliance with follow-up visit  [labs at [facility] on 04/21/23, Hematologist at [facility] on 04/28/23]  Exercise Interventions   Exercise Discussed/Reviewed Exercise Discussed, Exercise Reviewed, Physical Activity, Assistive device use and maintanence  [using walker for balance. HHPT encouraged to continue use but to wean off gradually if possible]  Physical Activity Discussed/Reviewed Physical Activity Discussed, Physical Activity Reviewed, Types of exercise  [encouraged to increase activity level as tolerated with a goal of 150 minutes per week]  Education Interventions   Education Provided Provided Education  Provided Verbal Education On Exercise, When to see the doctor, Labs  Labs Reviewed --  [CBC/hematology labs]  Mental Health Interventions   Mental Health Discussed/Reviewed Mental Health Discussed, Mental Health Reviewed  [patient reports feeling well and is hopeful that he will be able to drive again soon]  Nutrition Interventions   Nutrition Discussed/Reviewed Nutrition Discussed, Nutrition Reviewed, Fluid intake, Adding fruits and vegetables, Increasing proteins, Decreasing salt  [iron rich foods]  Pharmacy Interventions   Pharmacy Dicussed/Reviewed Pharmacy Topics Discussed, Pharmacy Topics Reviewed, Medications and their functions  [taking medications as prescribed]  Safety Interventions   Safety Discussed/Reviewed Safety Discussed, Safety Reviewed, Fall Risk, Home Safety  [practice fall prevention strategies]  Home Safety Assistive Devices  [encourged continued use of walker while needed. Patient can try to wean off as instructed by HHPT]       Follow up plan: Follow up call scheduled for 05/22/23    Encounter Outcome:  Patient Visit Completed   Demetrios Loll, RN, BSN [place]  [facility], [facility] RN Care Manager Direct Dial: [phone]

## 2023-04-21 ENCOUNTER — Inpatient Hospital Stay: Payer: Medicare HMO | Attending: Hematology

## 2023-04-21 DIAGNOSIS — D500 Iron deficiency anemia secondary to blood loss (chronic): Secondary | ICD-10-CM

## 2023-04-21 DIAGNOSIS — D509 Iron deficiency anemia, unspecified: Secondary | ICD-10-CM | POA: Insufficient documentation

## 2023-04-21 DIAGNOSIS — D75829 Heparin-induced thrombocytopenia, unspecified: Secondary | ICD-10-CM | POA: Insufficient documentation

## 2023-04-21 LAB — CBC WITH DIFFERENTIAL/PLATELET
Abs Immature Granulocytes: 0.06 10*3/uL (ref 0.00–0.07)
Basophils Absolute: 0 10*3/uL (ref 0.0–0.1)
Basophils Relative: 1 %
Eosinophils Absolute: 0.1 10*3/uL (ref 0.0–0.5)
Eosinophils Relative: 1 %
HCT: 38.3 % — ABNORMAL LOW (ref 39.0–52.0)
Hemoglobin: 12.2 g/dL — ABNORMAL LOW (ref 13.0–17.0)
Immature Granulocytes: 1 %
Lymphocytes Relative: 18 %
Lymphs Abs: 1.1 10*3/uL (ref 0.7–4.0)
MCH: 29.8 pg (ref 26.0–34.0)
MCHC: 31.9 g/dL (ref 30.0–36.0)
MCV: 93.4 fL (ref 80.0–100.0)
Monocytes Absolute: 1.3 10*3/uL — ABNORMAL HIGH (ref 0.1–1.0)
Monocytes Relative: 21 %
Neutro Abs: 3.5 10*3/uL (ref 1.7–7.7)
Neutrophils Relative %: 58 %
Platelets: 103 10*3/uL — ABNORMAL LOW (ref 150–400)
RBC: 4.1 MIL/uL — ABNORMAL LOW (ref 4.22–5.81)
RDW: 15.5 % (ref 11.5–15.5)
WBC: 6 10*3/uL (ref 4.0–10.5)
nRBC: 0 % (ref 0.0–0.2)

## 2023-04-21 LAB — COMPREHENSIVE METABOLIC PANEL
ALT: 20 U/L (ref 0–44)
AST: 25 U/L (ref 15–41)
Albumin: 4.2 g/dL (ref 3.5–5.0)
Alkaline Phosphatase: 72 U/L (ref 38–126)
Anion gap: 9 (ref 5–15)
BUN: 24 mg/dL — ABNORMAL HIGH (ref 8–23)
CO2: 30 mmol/L (ref 22–32)
Calcium: 9.2 mg/dL (ref 8.9–10.3)
Chloride: 102 mmol/L (ref 98–111)
Creatinine, Ser: 1.41 mg/dL — ABNORMAL HIGH (ref 0.61–1.24)
GFR, Estimated: 48 mL/min — ABNORMAL LOW (ref >60)
Glucose, Bld: 176 mg/dL — ABNORMAL HIGH (ref 70–99)
Potassium: 4.6 mmol/L (ref 3.5–5.1)
Sodium: 141 mmol/L (ref 135–145)
Total Bilirubin: 1.4 mg/dL — ABNORMAL HIGH (ref 0.0–1.2)
Total Protein: 6.9 g/dL (ref 6.5–8.1)

## 2023-04-21 LAB — IRON AND TIBC
Iron: 61 ug/dL (ref 45–182)
Saturation Ratios: 24 % (ref 17.9–39.5)
TIBC: 255 ug/dL (ref 250–450)
UIBC: 194 ug/dL

## 2023-04-21 LAB — FERRITIN: Ferritin: 56 ng/mL (ref 24–336)

## 2023-04-28 ENCOUNTER — Ambulatory Visit: Payer: Medicare HMO | Admitting: Oncology

## 2023-05-02 DIAGNOSIS — I5032 Chronic diastolic (congestive) heart failure: Secondary | ICD-10-CM | POA: Diagnosis not present

## 2023-05-02 DIAGNOSIS — Z299 Encounter for prophylactic measures, unspecified: Secondary | ICD-10-CM | POA: Diagnosis not present

## 2023-05-02 DIAGNOSIS — E1165 Type 2 diabetes mellitus with hyperglycemia: Secondary | ICD-10-CM | POA: Diagnosis not present

## 2023-05-02 DIAGNOSIS — I10 Essential (primary) hypertension: Secondary | ICD-10-CM | POA: Diagnosis not present

## 2023-05-02 DIAGNOSIS — S98132A Complete traumatic amputation of one left lesser toe, initial encounter: Secondary | ICD-10-CM | POA: Diagnosis not present

## 2023-05-02 DIAGNOSIS — J309 Allergic rhinitis, unspecified: Secondary | ICD-10-CM | POA: Diagnosis not present

## 2023-05-04 ENCOUNTER — Telehealth: Payer: Self-pay | Admitting: Cardiology

## 2023-05-04 NOTE — Telephone Encounter (Signed)
 This is an invalid number provided for stacy the pharmacy tech

## 2023-05-04 NOTE — Telephone Encounter (Signed)
 Stacy pharmacist tech calling from [facility] about diagnosis of heart failure and they noticed pt is not on an ace inhibitor or an ARB. She asked if its appropriate for him to be on one or no.

## 2023-05-22 ENCOUNTER — Encounter: Payer: Self-pay | Admitting: *Deleted

## 2023-05-22 ENCOUNTER — Ambulatory Visit: Payer: Self-pay | Admitting: *Deleted

## 2023-05-22 NOTE — Patient Outreach (Addendum)
 Care Coordination   Follow Up Visit Note   05/22/2023 Name: Mike Wright MRN: [id number] DOB: Jun 23, 1933  Mike Wright is a 89 y.o. year old male who sees [name], MD for primary care. I spoke with  [name] by phone today.  What matters to the patients health and wellness today?  Increasing activity level and participating in IADLs.    Goals Addressed             This Visit's Progress    Improve Balance and Mobility   On track    Care Coordination Goals: Patient will continue to practice fall precautions Patient will use rolling walker for ambulation Patient will notify provider of any falls Patient will continue to increase activity level as tolerated with a goal of 150 minutes per week Patient will wean off of walker, if possible, as advised by HHPT Continue to use when walking outside of the house Patient will try to incorporate short walks outdoors at the community park during good weather Patient will avoid crowded indoor spaces where he's more likely to be exposed to viruses Patient will call RN Care Manager with any care coordination or resource needs [phone]     Manage Anemia   On track    Care Coordination Goals: Patient will schedule follow-up with hematologist Patient will monitor for signs of worsening anemia and notify hematologist if he develops any Patient will seek emergency medical attention if needed Patient will notify provider of any obvious signs of bleeding Patient will reach out to RN Care Manager [phone] with any care coordination or resource needs     Manage COPD   On track    Care Coordination Goals: Patient will keep all follow-up PCP and/or pulmonary appointments Patient will reference COPD Action Plan handout as needed Patient will practice infection prevention strategies and avoid exposure to flu, covid, and RSV as much as possible Patient will increase activity level as tolerated with a goal of 150 minutes per  week Patient will use medications as directed by PCP or pulmonologist  Patient will continue to use O2 3 L per minute per [place] via floor concentrator PRN Patient will monitor and record O2 saturation levels daily and as needed and will reach out to provider with any readings less than 93% at rest and seek medical attention for less 90% at rest Patient will call RN Care Manager [phone] with any care coordination or resource needs         SDOH assessments and interventions completed:  No     Care Coordination Interventions:  Yes, provided  Interventions Today    Flowsheet Row Most Recent Value  Chronic Disease   Chronic disease during today's visit Other, Chronic Obstructive Pulmonary Disease (COPD)  [Anemia]  General Interventions   General Interventions Discussed/Reviewed General Interventions Discussed, General Interventions Reviewed, Labs, Durable Medical Equipment (DME), Doctor Visits, Communication with, Vaccines  Labs --  [facility], hemoglobin]  Vaccines Flu  [flu vaccine is up-to-date]  Doctor Visits Discussed/Reviewed Doctor Visits Discussed, Doctor Visits Reviewed, Annual Wellness Visits, PCP, Specialist  Durable Medical Equipment (DME) Levan Hurst walker]  PCP/Specialist Visits Compliance with follow-up visit  [encouraged to F/U with hematologist. Patient cancelled last appointment.]  Communication with PCP/Specialists  [staff message sent to [facility] Admin requesting that they reach out to patient to schedule F/U Appt. He had to cancel his Appt. in January but had his labs done. Received message back that  he declined to reschedule at thist  ime.]  Exercise Interventions   Exercise Discussed/Reviewed Exercise Discussed, Exercise Reviewed, Physical Activity, Assistive device use and maintanence  Physical Activity Discussed/Reviewed Physical Activity Discussed, Physical Activity Reviewed, Types of exercise  [ambulatory with rolling walker. He is weaning off per HHPT  recommendation but still uses it outside. Encouraged to increase walking as tolerated. Consider walking short distances outside at local park when weather is good.]  Education Interventions   Education Provided Provided Education  Provided Verbal Education On Medication, Exercise, When to see the doctor, Mental Health/Coping with Illness, Labs  Labs Reviewed --  [facility], Hemoglobin]  Mental Health Interventions   Mental Health Discussed/Reviewed Mental Health Discussed, Mental Health Reviewed  [Patient feels much better than he did this time last year. He's looking forward to increasing activity level and actively participating in IADLs like shopping and driving.]  Nutrition Interventions   Nutrition Discussed/Reviewed Nutrition Discussed, Nutrition Reviewed, Fluid intake, Adding fruits and vegetables  [continue to eat 3 balanced meals a day. Watch sodium intake.]  Pharmacy Interventions   Pharmacy Dicussed/Reviewed Pharmacy Topics Discussed, Pharmacy Topics Reviewed  [taking medications as prescribed. No questions or concerns.]  Safety Interventions   Safety Discussed/Reviewed Safety Discussed, Safety Reviewed, Fall Risk, Home Safety  Home Safety Assistive Devices        Follow up plan: Follow up call scheduled for 06/19/23    Encounter Outcome:  Patient Visit Completed  Demetrios Loll, RN, BSN [place]  [facility], [facility] RN Care Manager Direct Dial: [phone]

## 2023-06-08 DIAGNOSIS — I10 Essential (primary) hypertension: Secondary | ICD-10-CM | POA: Diagnosis not present

## 2023-06-08 DIAGNOSIS — R52 Pain, unspecified: Secondary | ICD-10-CM | POA: Diagnosis not present

## 2023-06-08 DIAGNOSIS — Z299 Encounter for prophylactic measures, unspecified: Secondary | ICD-10-CM | POA: Diagnosis not present

## 2023-06-08 DIAGNOSIS — I5032 Chronic diastolic (congestive) heart failure: Secondary | ICD-10-CM | POA: Diagnosis not present

## 2023-06-08 DIAGNOSIS — I739 Peripheral vascular disease, unspecified: Secondary | ICD-10-CM | POA: Diagnosis not present

## 2023-06-08 DIAGNOSIS — Z0000 Encounter for general adult medical examination without abnormal findings: Secondary | ICD-10-CM | POA: Diagnosis not present

## 2023-06-12 ENCOUNTER — Telehealth (HOSPITAL_COMMUNITY): Payer: Self-pay

## 2023-06-12 NOTE — Telephone Encounter (Signed)
 Called patient this morning with the intent to change their appointment to an earlier time on the same date, due to tech availability. Patient answered, I introduced my self, clarified the office that the appointment was for. Patient agreed to the time change. Patient iterated the change back to me to confirm. I thanked the patient for their time, and wished them a wonderful rest of their day.

## 2023-06-19 ENCOUNTER — Encounter: Payer: Self-pay | Admitting: *Deleted

## 2023-06-19 ENCOUNTER — Ambulatory Visit: Payer: Self-pay | Admitting: *Deleted

## 2023-06-20 ENCOUNTER — Other Ambulatory Visit: Payer: Self-pay | Admitting: Nurse Practitioner

## 2023-06-22 NOTE — Patient Outreach (Signed)
 Care Coordination   Follow Up Visit Note   06/19/2023 Name: MOHSIN CRUM MRN: [id number] DOB: 15-Nov-1933  Mike Wright is a 89 y.o. year old male who sees [name], MD for primary care. I spoke with  [name] by phone today.  What matters to the patients health and wellness today?  Fall Prevention    Goals Addressed             This Visit's Progress    Improve Balance and Mobility   Not on track    Care Coordination Goals: Patient will continue to practice fall precautions Patient will notify provider of any falls Patient will remain active and will increase activity level if tolerated Continue to use walker as needed for balance Patient will try to incorporate short walks outdoors at the community park during good weather Patient will call RN Care Manager with any care coordination or resource needs [phone]     COMPLETED: Manage Anemia   Not on track    Care Coordination Goals: Patient will monitor for signs of worsening anemia and notify hematologist if he develops any Patient will seek emergency medical attention if needed Patient will notify provider of any obvious signs of bleeding Patient will reach out to RN Care Manager [phone] with any care coordination or resource needs  Patient declined to follow-up with hematologist. No longer wishes to address.     Manage COPD   On track    Care Coordination Goals: Patient will keep all follow-up PCP and/or pulmonary appointments Patient will reference COPD Action Plan handout as needed Patient will practice infection prevention strategies and avoid exposure to flu, covid, and RSV as much as possible Patient will increase activity level as tolerated with a goal of 150 minutes per week Patient will use medications as directed by PCP or pulmonologist  Patient will continue to use O2 3 L per minute per [place] via floor concentrator PRN Patient will monitor and record O2 saturation levels daily and as needed  and will reach out to provider with any readings less than 93% at rest and seek medical attention for less 90% at rest Patient will call RN Care Manager [phone] with any care coordination or resource needs         SDOH assessments and interventions completed:  No     Care Coordination Interventions:  Yes, provided  Interventions Today    Flowsheet Row Most Recent Value  Chronic Disease   Chronic disease during today's visit Other, Chronic Obstructive Pulmonary Disease (COPD)  [Balance and Mobility Issues, High risk for falls, Anemia]  General Interventions   General Interventions Discussed/Reviewed General Interventions Discussed, General Interventions Reviewed, Durable Medical Equipment (DME)  [Patient fell yesterday and stated today, "My Balance isn't going to get better."]  Durable Medical Equipment (DME) Dan Humphreys, Oxygen  [3L O2]  Exercise Interventions   Exercise Discussed/Reviewed Physical Activity, Exercise Discussed, Exercise Reviewed  Physical Activity Discussed/Reviewed Physical Activity Discussed, Physical Activity Reviewed  [encouraged to remain active and to increase if tolerated. use walker for ambulation since balance is still a barrier. HHPT had recommended to wean off of it, but due to recent fall, it seems appropriate to use walker.]  Education Interventions   Education Provided Provided Education  Provided Verbal Education On Mental Health/Coping with Illness, Exercise, Medication, When to see the doctor, Other  [Infection prevention for elbow wound]  Mental Health Interventions   Mental Health Discussed/Reviewed Mental Health Discussed, Mental Health Reviewed  [Patient was  in good spirits. Affect was appropriate. Even though he doesn't think his balance will improve at this point, he did not seem or endorse any depression or negativity related to that conclusion.]  Nutrition Interventions   Nutrition Discussed/Reviewed Nutrition Discussed, Nutrition Reviewed,  Fluid intake, Portion sizes  [3 meals per day. Carb modified diet.]  Pharmacy Interventions   Pharmacy Dicussed/Reviewed Pharmacy Topics Discussed, Pharmacy Topics Reviewed, Medications and their functions  [taking medications as prescribed]  Safety Interventions   Safety Discussed/Reviewed Safety Discussed, Safety Reviewed, Fall Risk, Home Safety  [Patient did fall from a standing position yesterday. No major injury but he did break the skin on his elbow.]  Home Safety Assistive Devices  [walker]  Advanced Directive Interventions   Advanced Directives Discussed/Reviewed Advanced Directives Reviewed  [on file]       Follow up plan: Follow up call scheduled for 07/03/23    Encounter Outcome:  Patient Visit Completed   Demetrios Loll, RN, BSN [place]  [facility] RN Care Manager Direct Dial: [phone]  Fax: [phone]

## 2023-07-03 ENCOUNTER — Encounter: Payer: Self-pay | Admitting: *Deleted

## 2023-07-03 ENCOUNTER — Ambulatory Visit: Payer: Self-pay | Admitting: *Deleted

## 2023-07-06 NOTE — Patient Outreach (Signed)
 Complex Care Management   Visit Note  07/03/2023  Name:  Mike Wright MRN: [id number] DOB: [birth year]  Situation: Referral received for Complex Care Management related to COPD and impaired balance and increased risk for falls.  I obtained verbal consent from Mike Wright.  Visit completed with Mike Wright  on the phone  Background:   Past Medical History:  Diagnosis Date   Anemia    a. mild, noted 04/2017.   CAD in native artery    a. [place] 04/2017 s/p DES to D1, DES to prox-mid LAD, DES to prox LAD overlapping the prior stent, LVEF 55-65%.    Chronic a-fib (HCC)    Chronic diastolic CHF (congestive heart failure) (HCC)    Constipation    COPD (chronic obstructive pulmonary disease) (HCC)    Diabetic ulcer of toe (HCC)    DJD (degenerative joint disease) of cervical spine    Essential hypertension    GERD (gastroesophageal reflux disease)    History of hiatal hernia    HIT (heparin-induced thrombocytopenia) (HCC)    Hypothyroidism    Hypoxia    a. went home on home O2 04/2017.   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 Excell Seltzer & Dr. Laneta Simmers   Sinus bradycardia    a. HR 30s-40s in 04/2017 -> diltiazem stopped, metoprolol reduced.   Sleep apnea    "chose not to order CPAP at this time" (05/18/2017)   Type 2 diabetes mellitus (HCC)    Wears glasses     Assessment: Patient Reported Symptoms:  Cognitive Alert and oriented to person, place, and time, Able to follow simple commands, Insightful and able to interpret abstract concepts, Normal speech and language skills  Neurological Not assessed    HEENT Not assessed    Cardiovascular No symptoms reported    Respiratory No symptoms reported    Endocrine No symptoms reported     Gastrointestinal Not assessed    Genitourinary Not assessed    Integumentary Wound    Musculoskeletal Unsteady gait    Psychosocial Not assessed     Vitals:   07/06/23 0927  SpO2: 93%    Medications Reviewed Today     Reviewed by Gwenith Daily, RN (Registered Nurse) on 07/03/23 at 1319  Med List Status: <None>   Medication Order Taking? Sig Documenting Provider Last Dose Status Informant  Accu-Chek Softclix Lancets lancets [id number] Yes  [provider] Taking Active Self  acetaminophen (TYLENOL) 500 MG tablet [id number] Yes Take 1 tablet (500 mg total) by mouth every 6 (six) hours as needed for mild pain or headache.  Patient taking differently: Take 1,000 mg by mouth every 8 (eight) hours as needed for mild pain (pain score 1-3), moderate pain (pain score 4-6) or headache.   Drema Dallas, MD Taking Active Self  Alcohol Swabs (B-D SINGLE USE SWABS REGULAR) PADS [id number] Yes  [provider] Taking Active Self  amLODipine (NORVASC) 2.5 MG tablet [id number] Yes TAKE 1 TABLET EVERY DAY Branch, Dorothe Pea, MD Taking Active Self  amoxicillin (AMOXIL) 500 MG capsule [id number] No Take 4 capsules (2,000 mg total) one hour prior to dental work Janetta Hora, PA-C Taking Active  apixaban (ELIQUIS) 2.5 MG TABS tablet [id number] Yes Take 2.5 mg by mouth 2 (two) times daily. [provider] Taking Active Self  atorvastatin (LIPITOR) 40 MG tablet [id number] Yes Take 40 mg by mouth daily at 6 PM. [provider] Taking Active Self           Med Note Raquel Sarna Feb 02, 2023  1:44 PM)    Blood Glucose Monitoring Suppl (ACCU-CHEK GUIDE) w/Device Andria Rhein [id number] Yes  [provider] Taking Active Self  Cholecalciferol (VITAMIN D3) 50 MCG (2000 UT) TABS [id number] Yes Take 2,000 Units by mouth every evening. [provider] Taking Active Self  clopidogrel (PLAVIX) 75 MG tablet [id number] Yes Take 1 tablet (75 mg total) by mouth  daily with breakfast. Graceann Congress, PA-C Taking Active Self           Med Note Raquel Sarna Feb 02, 2023  1:44 PM)    Cyanocobalamin (B-12) 1000 MCG TABS [id number] Yes Take 2,000 mcg by mouth in the morning. [provider] Taking Active Self  diazepam (VALIUM) 2 MG tablet [id number] Yes Take 2 mg by mouth daily as needed for anxiety. [provider] Taking Active Self  DROPLET INSULIN SYRINGE 31G X 5/16" 1 ML MISC [id number] Yes  [provider] Taking Active Self  folic acid (FOLVITE) 800 MCG tablet [id number] Yes Take 400 mcg by mouth at bedtime. [provider] Taking Active Self  insulin glargine (LANTUS) 100 UNIT/ML injection [id number] Yes Inject 30 Units into the skin 2 (two) times daily. [provider] Taking Active Self           Med Note Harvest Dark, MEREDITH A   Tue Feb 07, 2023  9:28 AM) Took 15 units PM 02/06/23  ipratropium-albuterol (DUONEB) 0.5-2.5 (3) MG/3ML SOLN [id number] Yes Take 3 mLs by nebulization every 6 (six) hours as needed (wheezing/shortness of breath). [provider] Taking Active Self  isosorbide mononitrate (IMDUR) 60 MG 24 hr tablet [id number] Yes Take 1.5 tablets (90 mg total) by mouth in the morning and at bedtime. Janetta Hora, PA-C Taking Active   nitroGLYCERIN (NITROSTAT) 0.4 MG SL tablet [id number]  PLACE 1 TABLET (0.4 MG TOTAL) UNDER THE TONGUE EVERY 5 (FIVE) MINUTES AS NEEDED FOR CHEST PAIN. Sharlene Dory, NP  Active   OXYGEN [id number] Yes Inhale 2 L/min into the lungs See admin instructions. 2 L/min of oxygen at bedtime and during the day as needed for shortness of breath [provider] Taking Active Self  pantoprazole (PROTONIX) 40 MG tablet [id number] Yes Take 40 mg by mouth 2 (two) times daily. [provider] Taking Active Self  potassium chloride SA (KLOR-CON M) 20 MEQ tablet [id number] Yes Take 1 tablet (20 mEq total) by mouth 2 (two) times daily. Kendell Bane, MD  Taking Active Self  psyllium (REGULOID) 0.52 g capsule [id number] Yes Take 0.52 g by mouth 2 (two) times daily. [provider] Taking Active Self  SPIRIVA HANDIHALER 18 MCG inhalation capsule [id number] Yes Place 1 capsule into inhaler and inhale daily after breakfast.  [provider] Taking Active Self  torsemide (DEMADEX) 20 MG tablet [id number] Yes TAKE 1 TABLET TWICE DAILY Sharlene Dory, NP Taking Active Self  traZODone (DESYREL) 50 MG tablet [id number] Yes Take 100 mg by mouth at bedtime. [provider] Taking Active Self           Med Note Vassie Moment Nov 16, 2022  11:26 AM)    TRUE METRIX BLOOD GLUCOSE TEST test strip [id number] Yes  [provider] Taking Active Self            Recommendation:   PCP Follow-up Use  cane or other assistive devices for ambulation  Follow Up Plan:   Telephone follow up appointment date/time:  08/03/23 at 11:30  Demetrios Loll, RN, BSN [place]  [facility] RN Care Manager Direct Dial: [phone]  Fax: [phone]

## 2023-07-17 ENCOUNTER — Ambulatory Visit (HOSPITAL_BASED_OUTPATIENT_CLINIC_OR_DEPARTMENT_OTHER)
Admission: RE | Admit: 2023-07-17 | Discharge: 2023-07-17 | Disposition: A | Discharge status: Home / Self Care | Payer: Medicare HMO | Source: Ambulatory Visit | Attending: Surgery | Admitting: Surgery

## 2023-07-17 ENCOUNTER — Ambulatory Visit: Payer: Medicare HMO | Admitting: Physician Assistant

## 2023-07-17 ENCOUNTER — Ambulatory Visit (HOSPITAL_COMMUNITY)
Admission: RE | Admit: 2023-07-17 | Discharge: 2023-07-17 | Disposition: A | Discharge status: Home / Self Care | Payer: Medicare HMO | Source: Ambulatory Visit | Attending: Surgery | Admitting: Surgery

## 2023-07-17 VITALS — BP 135/69 | HR 48 | Temp 97.8°F | Ht 67.0 in | Wt 170.0 lb

## 2023-07-17 DIAGNOSIS — I739 Peripheral vascular disease, unspecified: Secondary | ICD-10-CM

## 2023-07-17 LAB — VAS US ABI WITH/WO TBI
Left ABI: 1.11
Right ABI: 0.98

## 2023-07-18 NOTE — Progress Notes (Signed)
 Office Note   History of Present Illness   Mike Wright is a 89 y.o. (10-22-1933) male who presents for surveillance of PAD.  He has a prior history of several vascular interventions including:  1) left PTA and peroneal artery angioplasty on 09/15/2017 by Dr. Charlotte Cookey 2) right PTA angioplasty on 09/20/2017 by Dr. Charlotte Cookey 3) right TMA on 02/05/2020 by Dr. Charlotte Cookey 4) right SFA stenting on 12/15/2020 by Dr. Charlotte Cookey 5) left TMA on 11/12/2021 by Dr. Vikki Graves  He returns today for follow-up.  He says overall he is doing well.  He now has shoe inserts for both of his TMA's.  He is able to walk around his house with a walker.  He denies any rest pain, claudication, or tissue loss.  He does have neuropathy which makes it difficult for him to feel his feet.  He takes a daily Eliquis , Plavix , and statin  Current Outpatient Medications  Medication Sig Dispense Refill   Accu-Chek Softclix Lancets lancets      acetaminophen  (TYLENOL ) 500 MG tablet Take 1 tablet (500 mg total) by mouth every 6 (six) hours as needed for mild pain or headache. (Patient taking differently: Take 1,000 mg by mouth every 8 (eight) hours as needed for mild pain (pain score 1-3), moderate pain (pain score 4-6) or headache.) 30 tablet 0   Alcohol  Swabs (B-D SINGLE USE SWABS REGULAR) PADS      amLODipine  (NORVASC ) 2.5 MG tablet TAKE 1 TABLET EVERY DAY 90 tablet 3   amoxicillin  (AMOXIL ) 500 MG capsule Take 4 capsules (2,000 mg total) one hour prior to dental work 16 capsule 3   apixaban  (ELIQUIS ) 2.5 MG TABS tablet Take 2.5 mg by mouth 2 (two) times daily.     atorvastatin  (LIPITOR ) 40 MG tablet Take 40 mg by mouth daily at 6 PM.     Blood Glucose Monitoring Suppl (ACCU-CHEK GUIDE) w/Device KIT      Cholecalciferol  (VITAMIN D3) 50 MCG (2000 UT) TABS Take 2,000 Units by mouth every evening.     clopidogrel  (PLAVIX ) 75 MG tablet Take 1 tablet (75 mg total) by mouth daily with breakfast. 30 tablet 2   Cyanocobalamin  (B-12) 1000 MCG  TABS Take 2,000 mcg by mouth in the morning.     diazepam  (VALIUM ) 2 MG tablet Take 2 mg by mouth daily as needed for anxiety.     DROPLET INSULIN  SYRINGE 31G X 5/16" 1 ML MISC      folic acid  (FOLVITE ) 800 MCG tablet Take 400 mcg by mouth at bedtime.     insulin  glargine (LANTUS ) 100 UNIT/ML injection Inject 30 Units into the skin 2 (two) times daily.     ipratropium-albuterol  (DUONEB) 0.5-2.5 (3) MG/3ML SOLN Take 3 mLs by nebulization every 6 (six) hours as needed (wheezing/shortness of breath).     isosorbide  mononitrate (IMDUR ) 60 MG 24 hr tablet Take 1.5 tablets (90 mg total) by mouth in the morning and at bedtime. 270 tablet 3   nitroGLYCERIN  (NITROSTAT ) 0.4 MG SL tablet PLACE 1 TABLET (0.4 MG TOTAL) UNDER THE TONGUE EVERY 5 (FIVE) MINUTES AS NEEDED FOR CHEST PAIN. 25 tablet 2   OXYGEN  Inhale 2 L/min into the lungs See admin instructions. 2 L/min of oxygen  at bedtime and during the day as needed for shortness of breath     pantoprazole  (PROTONIX ) 40 MG tablet Take 40 mg by mouth 2 (two) times daily.     potassium chloride  SA (KLOR-CON  M) 20 MEQ tablet Take 1 tablet (20  mEq total) by mouth 2 (two) times daily. 30 tablet 1   psyllium (REGULOID) 0.52 g capsule Take 0.52 g by mouth 2 (two) times daily.     SPIRIVA  HANDIHALER 18 MCG inhalation capsule Place 1 capsule into inhaler and inhale daily after breakfast.      torsemide  (DEMADEX ) 20 MG tablet TAKE 1 TABLET TWICE DAILY 180 tablet 3   traZODone  (DESYREL ) 50 MG tablet Take 100 mg by mouth at bedtime.     TRUE METRIX BLOOD GLUCOSE TEST test strip      No current facility-administered medications for this visit.    REVIEW OF SYSTEMS (negative unless checked):   Cardiac:  []  Chest pain or chest pressure? []  Shortness of breath upon activity? []  Shortness of breath when lying flat? []  Irregular heart rhythm?  Vascular:  []  Pain in calf, thigh, or hip brought on by walking? []  Pain in feet at night that wakes you up from your  sleep? []  Blood clot in your veins? []  Leg swelling?  Pulmonary:  []  Oxygen  at home? []  Productive cough? []  Wheezing?  Neurologic:  []  Sudden weakness in arms or legs? []  Sudden numbness in arms or legs? []  Sudden onset of difficult speaking or slurred speech? []  Temporary loss of vision in one eye? []  Problems with dizziness?  Gastrointestinal:  []  Blood in stool? []  Vomited blood?  Genitourinary:  []  Burning when urinating? []  Blood in urine?  Psychiatric:  []  Major depression  Hematologic:  []  Bleeding problems? []  Problems with blood clotting?  Dermatologic:  []  Rashes or ulcers?  Constitutional:  []  Fever or chills?  Ear/Nose/Throat:  []  Change in hearing? []  Nose bleeds? []  Sore throat?  Musculoskeletal:  []  Back pain? []  Joint pain? []  Muscle pain?   Physical Examination   Vitals:   07/17/23 1242  BP: 135/69  Pulse: (!) 48  Temp: 97.8 F (36.6 C)  TempSrc: Temporal  SpO2: 96%  Weight: 170 lb (77.1 kg)  Height: 5\' 7"  (1.702 m)   Body mass index is 26.63 kg/m.  General:  WDWN in NAD; vital signs documented above Gait: Not observed HENT: WNL, normocephalic Pulmonary: normal non-labored breathing , without rales, rhonchi,  wheezing Cardiac: regular Abdomen: soft, NT, no masses Skin: without rashes Vascular Exam/Pulses: Brisk PT Doppler signals bilaterally Extremities: Well-healed bilateral TMA's without open wounds or ischemic changes Musculoskeletal: no muscle wasting or atrophy  Neurologic: A&O X 3;  No focal weakness or paresthesias are detected Psychiatric:  The pt has Normal affect.  Non-Invasive Vascular imaging   ABI (07/17/2023) R:  ABI: 0.98 (1.12),  PT: bi DP: bi TBI:  amp L:  ABI: 1.11 (1.17),  PT: bi DP: bi TBI: amp  BLE Arterial Duplex (07/17/2023) Patent right SFA stent without stenosis.  Patent arterial flow in bilateral lower extremities without hemodynamically significant stenosis.  Medical Decision  Making   Mike Wright is a 89 y.o. male who presents for surveillance of PAD  Based on the patient's vascular studies, his ABIs are essentially unchanged bilaterally.  His right ABI is 0.98 and left ABI is 1.11 Arterial duplex of bilateral lower extremities demonstrates patent arterial flow without hemodynamically significant stenosis.  He has a patent right SFA stent The patient denies any claudication, rest pain, or tissue loss.  On exam he has well-healed bilateral TMA's.  He has brisk DP/PT Doppler signals He will continue to take his Plavix , Eliquis , and statin.  He can follow-up with our office in 1 year with  repeat bilateral lower extremity arterial duplex and ABIs   Sharlyn Odonnel PA-C [facility] Office: [phone]  Clinic MD: Charlotte Cookey

## 2023-07-19 ENCOUNTER — Other Ambulatory Visit: Payer: Self-pay | Admitting: Nurse Practitioner

## 2023-07-31 ENCOUNTER — Ambulatory Visit: Attending: Cardiology | Admitting: Cardiology

## 2023-07-31 ENCOUNTER — Telehealth: Payer: Self-pay | Admitting: Cardiology

## 2023-07-31 VITALS — BP 110/55 | HR 44 | Ht 67.0 in | Wt 165.6 lb

## 2023-07-31 DIAGNOSIS — I5032 Chronic diastolic (congestive) heart failure: Secondary | ICD-10-CM | POA: Diagnosis not present

## 2023-07-31 DIAGNOSIS — I25118 Atherosclerotic heart disease of native coronary artery with other forms of angina pectoris: Secondary | ICD-10-CM

## 2023-07-31 DIAGNOSIS — I4891 Unspecified atrial fibrillation: Secondary | ICD-10-CM | POA: Diagnosis not present

## 2023-07-31 NOTE — Progress Notes (Signed)
 Clinical Summary Mr. Irizarry is a 89 y.o.male seen today for focused visit on recent issues with diastolic HF.      1. Chronic HFpEF - 10/2021 echo: LVEF 70-75%, indet diastolic, normal RV, severe LAE, mod AS AVA VTI 1.14 mean grad 25  02/2023 echo: LVEF 65-70%, no WMAs, normal RV function. Normal TAVR valve.  - no recent SOB/DOE, chronic mild edema - compliant with meds. Taking torsemide  20mg  bid.  - home scale 165-175 lbs.    2.Aortic stenosis - s/p TAVR 01/2023 - Edwards Sapien 3 Ultra Resilia THV size 26 mm  - unresponsive after procedure, stroke imaging was benign - recovered neurologically back to his baseline  02/2023 echo: LVEF 65-70%, no WMAs, normal RV function. Normal TAVR valve.   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 The apparent finding is likely too small for MRA to be of utility as a follow up imaging modality."   - from 03/17/23 cards appt patient did not want repeat imagine.    4. CAD with stable angina - nonobstructive CAD by cath Jan 2012, LVEF 60-65% by LV gram.  - 06/2015 nuclear stress without clear ischemia - 05/2015 echo LVEF 65-70%, no WMAs, cannot evaluate diastolic function  09/2016 lexiscan  without ischemia, low risk study.    - cath 04/2017 as reported below. Received DES to 75% D1, DES to 75% mid to distal LAD, DES to 70% prox LAD RHC with CI 2.67, mean PA 25, no wedge reported by LVEDP 14.  - discharged on triple therapy with ASA, plavix , eliquis  with plan for 30 days, then stop ASA.     01/2020 nuclear stress: Small, mild intensity, mid to basal inferior/inferoseptal defect that is fixed and consistent with soft tissue attenuation in light of normal wall motion. No significant ischemic territories.    [facility] 12/16/22 showed continued patency of the stented segments  in the LAD and diagonal, nonobstructive plaquing in the circumflex and RCA, and no high-grade stenoses  - nonspecific chest pains at times, usually time.        3. Afib - admission 11/2018 with issues with afib - from notes had afib with slow rates, toprol  was stopped   - can have some palpitations at times - 3-4 times daily, short in duration - some dizziness more consistent with meclizine , consistent with vertigo.  - he is not sure if he resrated toprol  or not, we had previously stopped due to low HRs   4.History of GI bleeding - GI bleed during 11/2018, stopped eliquis  at that time, later restarted - heme +positive stools, negative endoscopy with plans for outpatient pill endoscopy    5. PAD - followed by vascular - admission 08/2017 with ischemia great toes. Had intervention on lower extremity vessels at that time.  -  ultimately required ampuation bilateral great toes   - admit 11/2017 for poor healing right great toe amputation, critical limb ischemia - admit 02/2019 with gangrene right second toe with nonhealing diabetic foot wound. S/p amputations - additional toe amputation 05/2019   - had repeat partial amputation 01/2020 - continues to follow with vascular, last appt 07/17/23     6. PSVT/Palpitations.  - no recent symptoms.    7. Chronic anemia - has received multiple transfusions - followed by closely.    8.  Hyperlipidemia - 09/2021 TC 71 TG 74 HDL 29 LDL 26 - he is on atorvastatin  80mg  daily.   01/2023 TC 81 TG 89 HDL 20 LDL 43    Past Medical History:  Diagnosis Date   Anemia    a. mild, noted 04/2017.   CAD in native artery    a. [place]  04/2017 s/p DES to D1, DES to prox-mid LAD, DES to prox LAD overlapping the prior stent, LVEF 55-65%.    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 went home on home O2 04/2017.   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 Arlester Ladd & Dr. Sherene Dilling   Sinus bradycardia    a. HR 30s-40s in 04/2017 -> diltiazem  stopped, metoprolol  reduced.   Sleep apnea    "chose not to order CPAP at this time" (05/18/2017)   Type 2 diabetes mellitus (HCC)    Wears glasses      Allergies  Allergen Reactions   Codeine Shortness Of Breath   Doxycycline  Swelling    Swelling and numbness in lips and face. Swelling improved after stopping. Reports still experiences numbness in bottom lip.    Feraheme  [Ferumoxytol ] Other (See Comments)    Diaphoretic, chest pain   Heparin  Other (See Comments)    +HIT,  Severe bleeding (with heparin  drip & large doses), tolerates low doses   Iron  Shortness Of Breath    Patient severe reaction to IV iron  and does not tolerate oral formulations either   Losartan Swelling   Oxycodone Other (See Comments)    "Made me act out of my mind" Mental status changes- hallucinations   Latex     Added based on information entered during case entry, please review and add reactions, type, and severity as needed   Trelegy Ellipta [Fluticasone-Umeclidin-Vilant]     "Blood clots in throat"     Current Outpatient Medications  Medication Sig Dispense Refill   Accu-Chek Softclix Lancets lancets      acetaminophen  (TYLENOL ) 500 MG tablet Take 1 tablet (500 mg total) by mouth every 6 (six) hours as needed for mild pain or headache. (Patient taking differently: Take 1,000 mg by mouth every 8 (eight) hours as  needed for mild pain (pain score 1-3), moderate pain (pain score 4-6) or headache.) 30 tablet 0   Alcohol  Swabs (B-D SINGLE USE SWABS REGULAR) PADS      amLODipine  (NORVASC ) 2.5 MG tablet TAKE 1 TABLET EVERY DAY 90 tablet 3   amoxicillin  (AMOXIL ) 500 MG capsule Take 4 capsules (2,000 mg total) one hour prior to dental work 16 capsule 3   apixaban  (ELIQUIS ) 2.5 MG TABS tablet Take 2.5 mg by mouth 2 (two) times daily.     atorvastatin  (LIPITOR ) 40 MG tablet Take 40 mg by mouth  daily at 6 PM.     Blood Glucose Monitoring Suppl (ACCU-CHEK GUIDE) w/Device KIT      Cholecalciferol  (VITAMIN D3) 50 MCG (2000 UT) TABS Take 2,000 Units by mouth every evening.     clopidogrel  (PLAVIX ) 75 MG tablet Take 1 tablet (75 mg total) by mouth daily with breakfast. 30 tablet 2   Cyanocobalamin  (B-12) 1000 MCG TABS Take 2,000 mcg by mouth in the morning.     diazepam  (VALIUM ) 2 MG tablet Take 2 mg by mouth daily as needed for anxiety.     DROPLET INSULIN  SYRINGE 31G X 5/16" 1 ML MISC      folic acid  (FOLVITE ) 800 MCG tablet Take 400 mcg by mouth at bedtime.     insulin  glargine (LANTUS ) 100 UNIT/ML injection Inject 30 Units into the skin 2 (two) times daily.     ipratropium-albuterol  (DUONEB) 0.5-2.5 (3) MG/3ML SOLN Take 3 mLs by nebulization every 6 (six) hours as needed (wheezing/shortness of breath).     isosorbide  mononitrate (IMDUR ) 60 MG 24 hr tablet Take 1.5 tablets (90 mg total) by mouth in the morning and at bedtime. 270 tablet 3   nitroGLYCERIN  (NITROSTAT ) 0.4 MG SL tablet PLACE 1 TABLET (0.4 MG TOTAL) UNDER THE TONGUE EVERY 5 (FIVE) MINUTES AS NEEDED FOR CHEST PAIN. 25 tablet 2   OXYGEN  Inhale 2 L/min into the lungs See admin instructions. 2 L/min of oxygen  at bedtime and during the day as needed for shortness of breath     pantoprazole  (PROTONIX ) 40 MG tablet Take 40 mg by mouth 2 (two) times daily.     potassium chloride  SA (KLOR-CON  M) 20 MEQ tablet Take 1 tablet (20 mEq total) by mouth 2 (two) times  daily. 30 tablet 1   psyllium (REGULOID) 0.52 g capsule Take 0.52 g by mouth 2 (two) times daily.     SPIRIVA  HANDIHALER 18 MCG inhalation capsule Place 1 capsule into inhaler and inhale daily after breakfast.      torsemide  (DEMADEX ) 20 MG tablet TAKE 1 TABLET TWICE DAILY 180 tablet 3   traZODone  (DESYREL ) 50 MG tablet Take 100 mg by mouth at bedtime.     TRUE METRIX BLOOD GLUCOSE TEST test strip      No current facility-administered medications for this visit.     Past Surgical History:  Procedure Laterality Date   ABDOMINAL AORTOGRAM W/LOWER EXTREMITY N/A 09/15/2017   Procedure: ABDOMINAL AORTOGRAM W/LOWER EXTREMITY;  Surgeon: Margherita Shell, MD;  [facility]: [facility];  Service: Cardiovascular;  Laterality: N/A;   ABDOMINAL AORTOGRAM W/LOWER EXTREMITY N/A 02/04/2020   Procedure: ABDOMINAL AORTOGRAM W/LOWER EXTREMITY;  Surgeon: Margherita Shell, MD;  Location: [facility];  Service: Cardiovascular;  Laterality: N/A;   ABDOMINAL AORTOGRAM W/LOWER EXTREMITY N/A 12/15/2020   Procedure: ABDOMINAL AORTOGRAM W/LOWER EXTREMITY;  Surgeon: Margherita Shell, MD;  Location: [facility];  Service: Cardiovascular;  Laterality: N/A;   AMPUTATION Bilateral 09/20/2017   Procedure: BILATERAL GREAT TOE AMPUTATIONS INCLUDING METATARSAL HEADS;  Surgeon: Margherita Shell, MD;  Location: [facility] OR;  Service: Vascular;  Laterality: Bilateral;   AMPUTATION Right 03/01/2019   Procedure: AMPUTATION TOES;  Surgeon: Dannis Dy, MD;  Location: [facility] OR;  Service: Vascular;  Laterality: Right;   AMPUTATION Right 05/29/2019   Procedure: AMPUTATION RIGHT THIRD TOE;  Surgeon: Margherita Shell, MD;  Location: [facility] OR;  Service: Vascular;  Laterality: Right;   AMPUTATION Right 09/26/2019   Procedure: AMPUTATION RIGHT FOURTH TOE AND FIFTH  TOES;  Surgeon: Margherita Shell, MD;  Location: [facility] OR;  Service: Vascular;  Laterality: Right;   ANGIOPLASTY Right 09/20/2017   Procedure: ANGIOPLASTY RIGHT TIBIAL  ARTERY;  Surgeon: Margherita Shell, MD;  Location: [facility] OR;  Service: Vascular;  Laterality: Right;   APPENDECTOMY     APPLICATION OF WOUND VAC  12/17/2020   Procedure: APPLICATION OF WOUND VAC;  Surgeon: Margherita Shell, MD;  Location: [facility] OR;  Service: Vascular;;   BACK SURGERY     CARDIAC CATHETERIZATION  1980s; 2012;   CATARACT EXTRACTION Left 2004   COLONOSCOPY  2004   FLEISHMAN TICS   COLONOSCOPY WITH PROPOFOL  N/A 11/28/2018   Procedure: COLONOSCOPY WITH PROPOFOL ;  Surgeon: Ruby Corporal, MD;  Location: [facility];  Service: Endoscopy;  Laterality: N/A;   CORONARY ANGIOPLASTY WITH STENT PLACEMENT  05/18/2017   "3 stents"   CORONARY STENT INTERVENTION N/A 05/18/2017   Procedure: CORONARY STENT INTERVENTION;  Surgeon: Lucendia Rusk, MD;  Location: [facility] INVASIVE CV LAB;  Service: Cardiovascular;  Laterality: N/A;   ESOPHAGOGASTRODUODENOSCOPY (EGD) WITH PROPOFOL  N/A 11/20/2017   Procedure: ESOPHAGOGASTRODUODENOSCOPY (EGD) WITH PROPOFOL ;  Surgeon: Tobin Forts, MD;  Location: [facility] ENDOSCOPY;  Service: Gastroenterology;  Laterality: N/A;   ESOPHAGOGASTRODUODENOSCOPY (EGD) WITH PROPOFOL  N/A 11/28/2018   Procedure: ESOPHAGOGASTRODUODENOSCOPY (EGD) WITH PROPOFOL ;  Surgeon: Ruby Corporal, MD;  [facility];  Service: Endoscopy;  Laterality: N/A;   ESOPHAGOGASTRODUODENOSCOPY (EGD) WITH PROPOFOL  N/A 07/12/2019   Procedure: ESOPHAGOGASTRODUODENOSCOPY (EGD) WITH PROPOFOL ;  Surgeon: Ruby Corporal, MD;  [facility];  Service: Endoscopy;  Laterality: N/A;  125   GIVENS CAPSULE STUDY N/A 12/17/2018   Procedure: GIVENS CAPSULE STUDY;  Surgeon: Ruby Corporal, MD;  [facility];  Service: Endoscopy;  Laterality: N/A;  7:30am   I & D EXTREMITY Right 12/17/2017   Procedure: IRRIGATION AND DEBRIDEMENT RIGHT GREAT TOE;  Surgeon: Margherita Shell, MD;  Location: [facility];  Service: Vascular;  Laterality: Right;   INCISION AND DRAINAGE Right 12/17/2020   Procedure: INCISION  AND DRAINAGE OF RIGHT FOOT;  Surgeon: Margherita Shell, MD;  Location: [facility];  Service: Vascular;  Laterality: Right;   INTRAOPERATIVE TRANSTHORACIC ECHOCARDIOGRAM N/A 02/07/2023   Procedure: INTRAOPERATIVE TRANSTHORACIC ECHOCARDIOGRAM;  Surgeon: Arnoldo Lapping, MD;  Location: [facility] INVASIVE CV LAB;  Service: Cardiovascular;  Laterality: N/A;   JOINT REPLACEMENT     LEFT HEART CATH AND CORONARY ANGIOGRAPHY N/A 05/18/2017   Procedure: LEFT HEART CATH AND CORONARY ANGIOGRAPHY;  Surgeon: Lucendia Rusk, MD;  Location: [facility] INVASIVE CV LAB;  Service: Cardiovascular;  Laterality: N/A;   LOWER EXTREMITY ANGIOGRAM Right 09/20/2017   Procedure: RIGHT LOWER LEG  ANGIOGRAM;  Surgeon: Margherita Shell, MD;  Location: [facility] OR;  Service: Vascular;  Laterality: Right;   LUMBAR FUSION  2002   L3, 4 L4, 5 L5 S1 Fused by Dr. Tawana Fast   PERIPHERAL VASCULAR BALLOON ANGIOPLASTY Left 09/15/2017   Procedure: PERIPHERAL VASCULAR BALLOON ANGIOPLASTY;  Surgeon: Margherita Shell, MD;  [facility]: [facility];  Service: Cardiovascular;  Laterality: Left;  PTA of Peroneal & Posterior Tibial   PERIPHERAL VASCULAR INTERVENTION Right 12/15/2020   Procedure: PERIPHERAL VASCULAR INTERVENTION;  Surgeon: Margherita Shell, MD;  [facility];  Service: Cardiovascular;  Laterality: Right;  SFA   POLYPECTOMY  11/28/2018   Procedure: POLYPECTOMY;  Surgeon: Ruby Corporal, MD;  [facility];  Service: Endoscopy;;  duodenum   POSTERIOR  LUMBAR FUSION     RHINOPLASTY     RIGHT HEART CATH N/A 05/18/2017   Procedure: RIGHT HEART CATH;  Surgeon: Lucendia Rusk, MD;  Location: [facility] INVASIVE CV LAB;  Service: Cardiovascular;  Laterality: N/A;   RIGHT/LEFT HEART CATH AND CORONARY ANGIOGRAPHY N/A 12/16/2022   Procedure: RIGHT/LEFT HEART CATH AND CORONARY ANGIOGRAPHY;  Surgeon: Arnoldo Lapping, MD;  Location: [facility] INVASIVE CV LAB;  Service: Cardiovascular;  Laterality: N/A;   TEE WITHOUT CARDIOVERSION N/A 09/18/2017    Procedure: TRANSESOPHAGEAL ECHOCARDIOGRAM (TEE);  Surgeon: Elmyra Haggard, MD;  Location: [facility] ENDOSCOPY;  Service: Cardiovascular;  Laterality: N/A;   TOTAL KNEE ARTHROPLASTY Bilateral    TRANSMETATARSAL AMPUTATION Right 02/04/2020   Procedure: RIGHT TRANSMETATARSAL AMPUTATION;  Surgeon: Margherita Shell, MD;  Location: [facility] OR;  Service: Vascular;  Laterality: Right;   TRANSMETATARSAL AMPUTATION Left 11/12/2021   Procedure: LEFT TRANSMETATARSAL AMPUTATION;  Surgeon: Adine Hoof, MD;  Location: [facility] OR;  Service: Vascular;  Laterality: Left;   TRANSURETHRAL RESECTION OF PROSTATE  2001   Krishnan     Allergies  Allergen Reactions   Codeine Shortness Of Breath   Doxycycline  Swelling    Swelling and numbness in lips and face. Swelling improved after stopping. Reports still experiences numbness in bottom lip.    Feraheme  [Ferumoxytol ] Other (See Comments)    Diaphoretic, chest pain   Heparin  Other (See Comments)    +HIT,  Severe bleeding (with heparin  drip & large doses), tolerates low doses   Iron  Shortness Of Breath    Patient severe reaction to IV iron  and does not tolerate oral formulations either   Losartan Swelling   Oxycodone Other (See Comments)    "Made me act out of my mind" Mental status changes- hallucinations   Latex     Added based on information entered during case entry, please review and add reactions, type, and severity as needed   Trelegy Ellipta [Fluticasone-Umeclidin-Vilant]     "Blood clots in throat"      Family History  Problem Relation Age of Onset   Heart attack Father 93   COPD Father    COPD Mother    Heart disease Mother    Diabetes Mother    Hypertension Sister    CVA Sister    Diabetes Sister    Multiple sclerosis Sister      Social History Mr. Sewer reports that he quit smoking about 40 years ago. His smoking use included cigarettes. He started smoking about 70 years ago. He has a 60 pack-year smoking history. He has never used  smokeless tobacco. Mr. Pastor reports that he does not currently use alcohol .    Physical Examination Today's Vitals   07/31/23 1312 07/31/23 1340  BP: 138/63 (!) 110/55  Pulse: (!) 44   SpO2: 94%   Weight: 165 lb 9.6 oz (75.1 kg)   Height: 5\' 7"  (1.702 m)    Body mass index is 25.94 kg/m.  Gen: resting comfortably, no acute distress HEENT: no scleral icterus, pupils equal round and reactive, no palptable cervical adenopathy,  CV: irreg, brady 45, no /rg, no jvd Resp: Clear to auscultation bilaterally GI: abdomen is soft, non-tender, non-distended, normal bowel sounds, no hepatosplenomegaly MSK: extremities are warm, no edema.  Skin: warm, no rash Neuro:  no focal deficits Psych: appropriate affect   Diagnostic Studies  05/2015 echo Study Conclusions   - Left ventricle: The cavity size was normal. Wall thickness was   increased increased in a pattern of mild to  moderate LVH.   Systolic function was vigorous. The estimated ejection fraction   was in the range of 65% to 70%. Wall motion was normal; there   were no regional wall motion abnormalities. The study is not   technically sufficient to allow evaluation of LV diastolic   function. - Aortic valve: Moderately calcified annulus. Mildly thickened   leaflets. There was mild stenosis. There was mild regurgitation.   Mean gradient (S): 13 mm Hg. Valve area (VTI): 1.51 cm^2. - Mitral valve: Mildly calcified annulus. Normal thickness leaflets   . - Left atrium: The atrium was moderately dilated. - Technically adequate study.     06/2015 Nuclear stress test No diagnostic ST segment changes to indicate ischemia. Small, mild intensity, perfusion defects noted in the apical anterior and basal inferolateral walls. This is most consistent with soft tissue attenuation given normal wall motion in these regions. No large ischemic zones noted. This is a low risk study. Nuclear stress EF: 64%.   05/2016 Event monitor Rhythm is  atrial fibrillation throughout study. Occasioanal PVCs Min HR 51, Max HR 107, Avg HR 67 Reported symptoms correspond with rate controlled atrial fibrillation.   09/2016 nuclear stress There was no ST segment deviation noted during stress. The study is normal. There are no perfusion defects consistent with prior infarct or current ischemia. This is a low risk study. The left ventricular ejection fraction is normal (55-65%).     04/2017 cath Dist RCA lesion is 40% stenosed. Prox RCA lesion is 25% stenosed. 1st Diag lesion is 75% stenosed. A drug-eluting stent was successfully placed using a STENT SYNERGY DES 2.25X12. Post intervention, there is a 0% residual stenosis. Prox LAD to Mid LAD lesion is 75% stenosed. A drug-eluting stent was successfully placed using a STENT SYNERGY DES 3.5X38. Post intervention, there is a 0% residual stenosis. Prox LAD lesion is 70% stenosed. A drug-eluting stent was successfully placed using a STENT SYNERGY DES 4X12, overlapping the prior stent. Post intervention, there is a 0% residual stenosis. The left ventricular systolic function is normal. The left ventricular ejection fraction is 55-65% by visual estimate. LV end diastolic pressure is normal. There is no aortic valve stenosis. Ao sat 98%, PA sat 65%; PA mean 25 mm Hg; unable to wedge the catheter. Normal right heat pressures. Tortuous right subclavian making catheter navigation difficult from the right radial approach.   Continue dual antiplatelet therapy along with Eliquis  for 30 days.   Restart Eliquis  tomorrow.   After 30 days, stop aspirin .  Continue Plavix  and Eliquis .     After 6 months, can consider stopping clopidogrel  if he has bleeding issues.  Given the nomber of stents, would try to complete one year of clopidogrel  if no bleeding issues.    01/2020 nuclear stress Small, mild intensity, mid to basal inferior/inferoseptal defect that is fixed and consistent with soft tissue  attenuation in light of normal wall motion. No significant ischemic territories. This is a low risk study. Nuclear stress EF: 63%.   Assessment and Plan   1.Chronic HFpEF - euvolemic, continue current meds  2. Afib - low rates today, denies significant symptoms however - he thinks he may have restarted metoprolol  at home, he will call us  from home to clarify, if so then d/c medication. Watchful waiting for his afib with slow VR - continue eliquis   3. CAD - no symptoms, continue current meds  4. HLD - at goal, continue current meds   Laurann Pollock, M.D.

## 2023-07-31 NOTE — Telephone Encounter (Signed)
 Updated medications as to how patient taking them and forwarded to provider to update.

## 2023-07-31 NOTE — Patient Instructions (Signed)
 Medication Instructions:  Your physician recommends that you continue on your current medications as directed. Please refer to the Current Medication list given to you today.   Labwork: None  Testing/Procedures: None  Follow-Up: Your physician recommends that you schedule a follow-up appointment in: 6 months  Any Other Special Instructions Will Be Listed Below (If Applicable). Please call our office to update us  on any medications not listed.   Thank you for choosing [facility]!     If you need a refill on your cardiac medications before your next appointment, please call your pharmacy.

## 2023-07-31 NOTE — Telephone Encounter (Signed)
  Pt c/o medication issue:  1. Name of Medication:  torsemide  20mg  1 tablet twice a day  metorpolol tartrate 100 mg half tablet everyday  2. How are you currently taking this medication (dosage and times per day)? As written  3. Are you having a reaction (difficulty breathing--STAT)? No   4. What is your medication issue? Pt's wife called to give these medications that pt is currently taking

## 2023-08-01 NOTE — Telephone Encounter (Signed)
 Per Dr. Amanda Jungling: Stop metoprolol , continue torsmide as he has been taking   Letta Raw MD   Advised patient of changes, verbalized understanding

## 2023-08-03 ENCOUNTER — Encounter: Payer: Self-pay | Admitting: *Deleted

## 2023-08-03 ENCOUNTER — Other Ambulatory Visit: Payer: Self-pay

## 2023-08-03 ENCOUNTER — Other Ambulatory Visit: Payer: Self-pay | Admitting: *Deleted

## 2023-08-10 ENCOUNTER — Telehealth: Payer: Self-pay | Admitting: *Deleted

## 2023-08-14 ENCOUNTER — Encounter: Payer: Self-pay | Admitting: *Deleted

## 2023-08-14 ENCOUNTER — Other Ambulatory Visit: Payer: Self-pay | Admitting: *Deleted

## 2023-09-04 ENCOUNTER — Other Ambulatory Visit: Payer: Self-pay | Admitting: Cardiology

## 2023-09-07 ENCOUNTER — Encounter: Payer: Self-pay | Admitting: *Deleted

## 2023-09-07 ENCOUNTER — Other Ambulatory Visit: Payer: Self-pay | Admitting: *Deleted

## 2023-09-11 ENCOUNTER — Ambulatory Visit: Payer: Medicare HMO | Admitting: Cardiology

## 2023-09-26 ENCOUNTER — Other Ambulatory Visit: Payer: Self-pay | Admitting: Cardiology

## 2023-10-02 ENCOUNTER — Other Ambulatory Visit: Payer: Self-pay | Admitting: *Deleted

## 2023-10-02 ENCOUNTER — Encounter: Payer: Self-pay | Admitting: *Deleted

## 2023-10-12 ENCOUNTER — Other Ambulatory Visit: Payer: Self-pay | Admitting: *Deleted

## 2023-10-12 ENCOUNTER — Other Ambulatory Visit: Payer: Self-pay

## 2023-10-12 ENCOUNTER — Encounter: Payer: Self-pay | Admitting: *Deleted

## 2023-10-12 NOTE — Patient Outreach (Signed)
 Complex Care Management   Visit Note  10/12/2023  Name:  Mike Wright MRN: [id number] DOB: 1933/09/20  Situation: Referral received for Complex Care Management related to COPD and high fall risk. I obtained verbal consent from Patient.  Visit completed with Zachary LELON Arts on the phone  Background:   Past Medical History:  Diagnosis Date   Anemia    a. mild, noted 04/2017.   CAD in native artery    a. [place]  04/2017 s/p DES to D1, DES to prox-mid LAD, DES to prox LAD overlapping the prior stent, LVEF 55-65%.    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 went home on home O2 04/2017.   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 Wonda & Dr. Lucas   Sinus bradycardia    a. HR 30s-40s in 04/2017 -> diltiazem  stopped, metoprolol  reduced.   Sleep apnea    chose not to order CPAP at this time (05/18/2017)   Type 2 diabetes mellitus (HCC)    Wears glasses     Assessment: Patient Reported Symptoms:  Cognitive Cognitive Status: Alert and oriented to person, place, and time, Normal speech and language skills, No symptoms reported Cognitive/Intellectual Conditions Management [RPT]: None reported or documented in medical history or problem list   Health Maintenance Behaviors: Annual physical exam Healing Pattern: Unsure Health Facilitated by: Rest  Neurological Neurological Review  of Symptoms: No symptoms reported    HEENT HEENT Symptoms Reported: No symptoms reported      Cardiovascular Cardiovascular Symptoms Reported: No symptoms reported    Respiratory Respiratory Symptoms Reported: No symptoms reported    Endocrine Endocrine Symptoms Reported: No symptoms reported Is patient diabetic?: Yes Is patient checking blood sugars at home?: Yes List most recent blood sugar readings, include date and time of day: 90 this morning Endocrine Self-Management Outcome: 4 (good) Endocrine Comment: managed by PCP. Next appointment is scheduled for 10/25/23  Gastrointestinal Gastrointestinal Symptoms Reported: No symptoms reported      Genitourinary Genitourinary Symptoms Reported: No symptoms reported    Integumentary Integumentary Symptoms Reported: No symptoms reported    Musculoskeletal Musculoskelatal Symptoms Reviewed: Back pain, Joint pain, Limited mobility Musculoskeletal Comment: low back pain and right hip pain is improving. Taking tylenol . Scheduled to see PCP on 10/25/23. Using rolling walker and wife has a gait belt to assist as needed.      Psychosocial Psychosocial Symptoms Reported: No symptoms reported            08/29/2022    1:13 PM  Depression screen PHQ 2/9  Decreased Interest 0  Down, Depressed, Hopeless 1  PHQ - 2 Score 1    There were no vitals filed for this visit.  Medications Reviewed Today     Reviewed by Khalidah Herbold,  Josette SAILOR, RN (Registered Nurse) on 10/12/23 at 1605  Med List Status: <None>   Medication Order Taking? Sig Documenting Provider Last Dose Status Informant  Accu-Chek Softclix Lancets lancets [id number] Yes  [provider]  Active Self  acetaminophen  (TYLENOL ) 500 MG tablet [id number] Yes Take 1 tablet (500 mg total) by mouth every 6 (six) hours as needed for mild pain or headache.  Patient taking differently: Take 1,000 mg by mouth every 8 (eight) hours as needed for mild pain (pain score 1-3), moderate pain (pain  score 4-6) or headache.   Milissa Tod PARAS, MD  Active Self  Alcohol  Swabs (B-D SINGLE USE SWABS REGULAR) PADS [id number] Yes  [provider]  Active Self  amLODipine  (NORVASC ) 2.5 MG tablet [id number] Yes TAKE 1 TABLET EVERY DAY Branch, Dorn FALCON, MD  Active   amoxicillin  (AMOXIL ) 500 MG capsule [id number]  Take 4 capsules (2,000 mg total) one hour prior to dental work  Patient not taking: Reported on 10/12/2023   Sebastian Lamarr SAUNDERS, PA-C  Active   apixaban  (ELIQUIS ) 2.5 MG TABS tablet [id number] Yes Take 2.5 mg by mouth 2 (two) times daily. [provider]  Active Self  atorvastatin  (LIPITOR ) 40 MG tablet [id number] Yes Take 40 mg by mouth daily at 6 PM. [provider]  Active Self           Med Note CLAUD MICHEAL ONEIDA [place] Feb 02, 2023  1:44 PM)    Blood Glucose Monitoring Suppl (ACCU-CHEK GUIDE) w/Device KIT [id number] Yes  [provider]  Active Self  Cholecalciferol  (VITAMIN D3) 50 MCG (2000 UT) TABS [id number] Yes Take 2,000 Units by mouth every evening. [provider]  Active Self  clopidogrel  (PLAVIX ) 75 MG tablet [id number] Yes Take 1 tablet (75 mg total) by mouth daily with breakfast. Charlyne Reed, PA-C  Active Self           Med Note CLAUD MICHEAL ONEIDA [place] Feb 02, 2023  1:44 PM)    Cyanocobalamin  (B-12) 1000 MCG TABS [id number] Yes Take 2,000 mcg by mouth in the morning. [provider]  Active Self  diazepam  (VALIUM ) 2 MG tablet [id number] Yes Take 2 mg by mouth daily as needed for anxiety. [provider]  Active Self  DROPLET INSULIN  SYRINGE 31G X 5/16 1 ML MISC [id number] Yes  [provider]  Active Self  folic acid  (FOLVITE ) 800 MCG tablet [id number] Yes Take 400 mcg by mouth at bedtime. [provider]  Active Self  insulin  glargine (LANTUS ) 100 UNIT/ML injection [id number] Yes Inject 30 Units into the skin 2 (two) times daily. [provider]  Active Self           Med Note JIM, MEREDITH  A   Tue Feb 07, 2023  9:28 AM) Took 15 units PM 02/06/23  ipratropium-albuterol  (DUONEB) 0.5-2.5 (3) MG/3ML SOLN [id number] Yes Take 3 mLs by nebulization every 6 (six) hours as needed (wheezing/shortness of breath). [provider]  Active Self  isosorbide  mononitrate (IMDUR ) 60 MG 24 hr tablet [id number] Yes TAKE 1 TABLET TWICE DAILY Thompson, Kathryn R, PA-C  Active   nitroGLYCERIN  (NITROSTAT ) 0.4 MG SL tablet [id number] Yes PLACE 1 TABLET (0.4 MG TOTAL) UNDER THE TONGUE EVERY 5 (FIVE) MINUTES AS NEEDED FOR CHEST PAIN. Miriam Norris, NP  Active   OXYGEN  [id number] Yes Inhale 2 L/min into the lungs See admin instructions. 2 L/min of oxygen  at bedtime and during the day as needed for shortness of  breath [provider]  Active Self  pantoprazole  (PROTONIX ) 40 MG tablet [id number] Yes Take 40 mg by mouth 2 (two) times daily. [provider]  Active Self  potassium chloride  SA (KLOR-CON  M) 20 MEQ tablet [id number] Yes Take 1 tablet (20 mEq total) by mouth 2 (two) times daily. Willette Adriana LABOR, MD  Active Self  psyllium (REGULOID) 0.52 g capsule [id number] Yes Take 0.52 g by mouth 2 (two) times daily. [provider]  Active Self  SPIRIVA  HANDIHALER 18 MCG inhalation capsule [id number] Yes Place 1 capsule into inhaler and inhale daily after breakfast.  [provider]  Active Self  torsemide  (DEMADEX ) 20 MG tablet [id number] Yes TAKE 1 TABLET TWICE DAILY  Patient taking differently: Take 20 mg by mouth 2 (two) times daily. 40mg  in the morning and 20 mg in the afternoon   Miriam Norris, NP  Active Self  traZODone  (DESYREL ) 50 MG tablet [id number] Yes Take 100 mg by mouth at bedtime. [provider]  Active Self           Med Note JACKOLYN APOLINAR SAUNDERS   Wed Nov 16, 2022 11:26 AM)    TRUE METRIX BLOOD GLUCOSE TEST test strip [id number] Yes  [provider]  Active Self            Recommendation:   PCP Follow-up Continue Current Plan of  Care  Follow Up Plan:   Telephone follow up appointment date/time:  10/30/23 at 1:00  Josette Pellet, RN, BSN [place]  [facility] RN Care Manager Direct Dial: [phone]  Fax: [phone]

## 2023-10-12 NOTE — Patient Instructions (Signed)
 Visit Information  Thank you for taking time to visit with me today. Please don't hesitate to contact me if I can be of assistance to you before our next scheduled appointment.  Your next care management appointment is by telephone on 10/30/23 at 1:00  Please call the care guide team at [phone] if you need to cancel, schedule, or reschedule an appointment.   Please call the [facility]: [phone] call 911 if you are experiencing a Mental Health or Behavioral Health Crisis or need someone to talk to.  Josette Pellet, RN, BSN [place]  [facility] Health RN Care Manager Direct Dial: [phone]  Fax: [phone]

## 2023-10-25 DIAGNOSIS — Z1339 Encounter for screening examination for other mental health and behavioral disorders: Secondary | ICD-10-CM | POA: Diagnosis not present

## 2023-10-25 DIAGNOSIS — Z0000 Encounter for general adult medical examination without abnormal findings: Secondary | ICD-10-CM | POA: Diagnosis not present

## 2023-10-25 DIAGNOSIS — K861 Other chronic pancreatitis: Secondary | ICD-10-CM | POA: Diagnosis not present

## 2023-10-25 DIAGNOSIS — Z1331 Encounter for screening for depression: Secondary | ICD-10-CM | POA: Diagnosis not present

## 2023-10-25 DIAGNOSIS — R52 Pain, unspecified: Secondary | ICD-10-CM | POA: Diagnosis not present

## 2023-10-25 DIAGNOSIS — Z7189 Other specified counseling: Secondary | ICD-10-CM | POA: Diagnosis not present

## 2023-10-25 DIAGNOSIS — I10 Essential (primary) hypertension: Secondary | ICD-10-CM | POA: Diagnosis not present

## 2023-10-25 DIAGNOSIS — E119 Type 2 diabetes mellitus without complications: Secondary | ICD-10-CM | POA: Diagnosis not present

## 2023-10-25 DIAGNOSIS — Z299 Encounter for prophylactic measures, unspecified: Secondary | ICD-10-CM | POA: Diagnosis not present

## 2023-10-25 LAB — HEMOGLOBIN A1C: Hemoglobin A1C: 6.6

## 2023-10-29 ENCOUNTER — Other Ambulatory Visit: Payer: Self-pay | Admitting: Nurse Practitioner

## 2023-10-30 ENCOUNTER — Telehealth: Admitting: *Deleted

## 2023-10-30 ENCOUNTER — Other Ambulatory Visit: Payer: Self-pay | Admitting: *Deleted

## 2023-10-30 ENCOUNTER — Other Ambulatory Visit: Payer: Self-pay

## 2023-10-30 NOTE — Patient Outreach (Addendum)
 Complex Care Management   Visit Note  10/30/2023  Name:  Mike Wright MRN: [id number] DOB: [birth year]  Situation: Referral received for Complex Care Management related to COPD I obtained verbal consent from Patient.  Visit completed with patient  on the phone  Mobility/falls He reports he is getting back to his baseline mobility as he continues to work with his home health PT on balance, mobility Denies recent falls since last RN CM outreach   Hypoglycemia Voiced concern with insulin /Lantus  dosages  Usually low cbg in the mornings- having to take OJ or glucose tablets frequently now to increase cbg value  Confirms cbg levels stay during the day from 100-160- highest 230  Presently on Insulin  Lantus  30 mg twice a day Managed by pcp  New CGM -Dexcom G7-pending arrival at local pharmacy -to prevent lots of sticking finger  - inquired about the status of the device    Background:   Past Medical History:  Diagnosis Date   Anemia    a. mild, noted 04/2017.   CAD in native artery    a. [place]  04/2017 s/p DES to D1, DES to prox-mid LAD, DES to prox LAD overlapping the prior stent, LVEF 55-65%.    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 went home on home O2 04/2017.   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 Wonda & Dr. Lucas   Sinus bradycardia    a. HR 30s-40s in 04/2017 ->  diltiazem  stopped, metoprolol  reduced.   Sleep apnea    chose not to order CPAP at this time (05/18/2017)   Type 2 diabetes mellitus (HCC)    Wears glasses     Assessment: Patient Reported Symptoms:  Cognitive Cognitive Status: Alert and oriented to person, place, and time, Normal speech and language skills, Insightful and able to interpret abstract concepts Cognitive/Intellectual Conditions Management [RPT]: None reported or documented in medical history or problem list   Health Maintenance Behaviors: Annual physical exam, Sleep adequate Healing Pattern: Average Health Facilitated by: Rest, Pain control  Neurological Neurological Review of Symptoms: Numbness Neurological Management Strategies: Adequate rest, Medication therapy Neurological Self-Management Outcome: 4 (good) Neurological Comment: neuropathy of feet  HEENT HEENT Symptoms Reported: No symptoms reported HEENT Self-Management Outcome: 4 (good)    Cardiovascular Cardiovascular Symptoms Reported: No symptoms reported Cardiovascular Self-Management Outcome: 4 (good)  Respiratory Respiratory Symptoms Reported: No symptoms reported Respiratory Management Strategies: Oxygen  therapy, Medication therapy, Adequate rest Respiratory Self-Management Outcome: 4 (good)  Endocrine Endocrine Symptoms Reported: Hypoglycemia (Reporting low cbg values in the mornings resulting in him taking orange juice or glucose tabs - Reports this has been going on for a while) Is patient diabetic?: Yes Is patient checking blood sugars at home?: Yes List most recent blood sugar readings, include  date and time of day: below 90s in mornings, throught the day cbg ranges from 100-160 highest at 230 if I have too much bread Endocrine Self-Management Outcome: 4 (good) Endocrine Comment: managed by pcp  Gastrointestinal Gastrointestinal Symptoms Reported: No symptoms reported Gastrointestinal Self-Management Outcome: 4 (good)    Genitourinary  Genitourinary Symptoms Reported: No symptoms reported Genitourinary Self-Management Outcome: 4 (good)  Integumentary Integumentary Symptoms Reported: No symptoms reported Skin Self-Management Outcome: 4 (good)  Musculoskeletal Musculoskelatal Symptoms Reviewed: Limited mobility, Other Other Musculoskeletal Symptoms: sore- pain only with movement only Musculoskeletal Management Strategies: Adequate rest, Medical device, Medication therapy Musculoskeletal Self-Management Outcome: 4 (good) Falls in the past year?: Yes Number of falls in past year: 2 or more Was there an injury with Fall?: Yes Fall Risk Category Calculator: 3 Patient Fall Risk Level: High Fall Risk Patient at Risk for Falls Due to: History of fall(s), Impaired balance/gait Fall risk Follow up: Falls evaluation completed, Falls prevention discussed  Psychosocial Psychosocial Symptoms Reported: No symptoms reported Behavioral Health Self-Management Outcome: 4 (good) Major Change/Loss/Stressor/Fears (CP): Denies Quality of Family Relationships: helpful, involved, supportive Do you feel physically threatened by others?: No      10/30/2023    2:13 PM  Depression screen PHQ 2/9  Decreased Interest 0  Down, Depressed, Hopeless 0  PHQ - 2 Score 0    There were no vitals filed for this visit.  Medications Reviewed Today     Reviewed by Ramonita Suzen CROME, RN (Registered Nurse) on 10/30/23 at 1356  Med List Status: <None>   Medication Order Taking? Sig Documenting Provider Last Dose Status Informant  Accu-Chek Softclix Lancets lancets [id number]   [provider]  Active Self  acetaminophen  (TYLENOL ) 500 MG tablet [id number] Yes Take 1 tablet (500 mg total) by mouth every 6 (six) hours as needed for mild pain or headache. Milissa Tod PARAS, MD  Active Self  Alcohol  Swabs (B-D SINGLE USE SWABS REGULAR) PADS [id number] Yes   Patient taking differently: No sig reported   [provider]  Active Self  amLODipine   (NORVASC ) 2.5 MG tablet [id number]  TAKE 1 TABLET EVERY DAY Branch, Dorn FALCON, MD  Active   amoxicillin  (AMOXIL ) 500 MG capsule [id number]  Take 4 capsules (2,000 mg total) one hour prior to dental work  Patient not taking: Reported on 10/30/2023   Sebastian Lamarr SAUNDERS, PA-C  Consider Medication Status and Discontinue (Completed Course)   apixaban  (ELIQUIS ) 2.5 MG TABS tablet [id number] Yes Take 2.5 mg by mouth 2 (two) times daily. [provider]  Active Self  atorvastatin  (LIPITOR ) 40 MG tablet [id number] Yes Take 40 mg by mouth daily at 6 PM. [provider]  Active Self           Med Note CLAUD MICHEAL ONEIDA [place] Feb 02, 2023  1:44 PM)    Blood Glucose Monitoring Suppl (ACCU-CHEK GUIDE) w/Device KIT [id number] Yes  [provider]  Active Self  Cholecalciferol  (VITAMIN D3) 50 MCG (2000 UT) TABS [id number] Yes Take 2,000 Units by mouth every evening. [provider]  Active Self  clopidogrel  (PLAVIX ) 75 MG tablet [id number] Yes Take 1 tablet (75 mg total) by mouth daily with breakfast. Charlyne Reed, PA-C  Active Self           Med Note CLAUD MICHEAL ONEIDA [place] Feb 02, 2023  1:44 PM)    Cyanocobalamin  (B-12) 1000 MCG TABS [id number] Yes Take 2,000 mcg by mouth in the morning. [provider]  Active Self  diazepam  (VALIUM ) 2 MG tablet [id number] Yes Take 2 mg by mouth daily as needed for anxiety. [provider]  Active Self  DROPLET INSULIN  SYRINGE 31G X 5/16 1 ML MISC [id number] Yes  [provider]  Active Self  DROPLET PEN NEEDLES 32G X 4 MM MISC [id number] Yes  [provider]  Active   folic acid  (FOLVITE ) 800 MCG tablet [id number] Yes Take 400 mcg by mouth at bedtime. [provider]  Active Self  insulin  glargine (LANTUS ) 100 UNIT/ML injection [id number] Yes Inject 30 Units into the skin 2 (two) times daily. [provider]  Active Self           Med Note JIM, MEREDITH A   Tue Feb 07, 2023  9:28 AM) Took 15  units PM 02/06/23  ipratropium-albuterol  (DUONEB) 0.5-2.5 (3) MG/3ML SOLN [id number] Yes Take 3 mLs by nebulization every 6 (six) hours as needed (wheezing/shortness of breath). [provider]  Active Self  isosorbide  mononitrate (IMDUR ) 60 MG 24 hr tablet [id number] Yes TAKE 1 TABLET TWICE DAILY Thompson, Kathryn R, PA-C  Active   metoprolol  tartrate (LOPRESSOR ) 100 MG tablet [id number] Yes Take by mouth. [provider]  Active   nitroGLYCERIN  (NITROSTAT ) 0.4 MG SL tablet [id number] Yes PLACE 1 TABLET (0.4 MG TOTAL) UNDER THE TONGUE EVERY 5 (FIVE) MINUTES AS NEEDED FOR CHEST PAIN. Miriam Norris, NP  Active   OXYGEN  [id number] Yes Inhale 2 L/min into the lungs See admin instructions. 2 L/min of oxygen  at bedtime and during the day as needed for shortness of breath [provider]  Active Self  pantoprazole  (PROTONIX ) 40 MG tablet [id number] Yes Take 40 mg by mouth 2 (two) times daily. [provider]  Active Self  Potassium Chloride  ER 20 MEQ TBCR [id number] Yes Take 1 tablet by mouth daily. [provider]  Active   potassium chloride  SA (KLOR-CON  M) 20 MEQ tablet [id number] Yes Take 1 tablet (20 mEq total) by mouth 2 (two) times daily. Willette Adriana LABOR, MD  Active Self  psyllium (REGULOID) 0.52 g capsule [id number] Yes Take 0.52 g by mouth 2 (two) times daily. [provider]  Active Self  SPIRIVA  HANDIHALER 18 MCG inhalation capsule [id number] Yes Place 1 capsule into inhaler and inhale daily after breakfast.  [provider]  Active Self  torsemide  (DEMADEX ) 20 MG tablet [id number] Yes TAKE 1 TABLET TWICE DAILY  Patient taking differently: Take 20 mg by mouth 2 (two) times daily. 40mg  in the morning and 20 mg in the afternoon   Miriam Norris, NP  Active Self  traZODone  (DESYREL ) 50 MG tablet [id number] Yes Take 100 mg by mouth at bedtime. [provider]  Active Self           Med Note JACKOLYN APOLINAR SAUNDERS   Wed Nov 16, 2022 11:26  AM)    TRUE METRIX BLOOD GLUCOSE TEST test strip [id number] Yes  [provider]  Active Self            Recommendation:   PCP Follow-up Specialty provider follow-up cardiology 01/25/24 Continue Current Plan of Care Possible change in night insulin  dose as patient reports hypoglycemia only in mornings- Update patient on the status of his new CGM   Follow Up Plan:   Telephone follow up appointment date/time:  11/29/23 1:30 pm with Josette Charlsie Iha L. Ramonita, RN, BSN, CCM [place]  [facility], [facility] Health RN Care Manager Direct Dial: [phone]  Fax: [phone]

## 2023-10-30 NOTE — Patient Instructions (Signed)
 Visit Information  Thank you for taking time to visit with me today. Please don't hesitate to contact me if I can be of assistance to you before our next scheduled appointment.  Your next care management appointment is by telephone on 11/29/23 at 1:30 pm    Please call the care guide team at [phone] if you need to cancel, schedule, or reschedule an appointment.   Please call the Suicide and Crisis Lifeline: 988 call the [place]  National Suicide Prevention Lifeline: 938-108-5331 or TTY: 838-798-1290 TTY 508-196-8959) to talk to a trained counselor call 1-800-273-TALK (toll free, 24 hour hotline) call the [facility]: [phone] call 911 if you are experiencing a Mental Health or Behavioral Health Crisis or need someone to talk to.  Klyde Banka L. Ramonita, RN, BSN, CCM [place]  [facility], [facility] Health RN Care Manager Direct Dial: [phone]  Fax: [phone]

## 2023-11-16 ENCOUNTER — Other Ambulatory Visit: Payer: Self-pay | Admitting: Cardiology

## 2023-11-24 ENCOUNTER — Other Ambulatory Visit: Payer: Self-pay

## 2023-11-24 MED ORDER — TORSEMIDE 20 MG PO TABS: 20.0000 mg | ORAL_TABLET | Freq: Two times a day (BID) | ORAL | 2 refills | Status: AC

## 2023-11-29 ENCOUNTER — Telehealth: Admitting: *Deleted

## 2023-11-30 ENCOUNTER — Telehealth: Admitting: *Deleted

## 2023-12-06 ENCOUNTER — Other Ambulatory Visit: Payer: Self-pay

## 2023-12-06 NOTE — Patient Outreach (Signed)
 Complex Care Management   Visit Note  12/06/2023  Name:  Mike Wright MRN: [id number] DOB: Sep 03, 1933  Situation: Referral received for Complex Care Management related to Heart Failure, COPD, and Impaired Mobility. I obtained verbal consent from .  Visit completed with Patient  Mike Wright by telephone. Denies concerns today, denies medication changes.  Reports two falls, one on 11/27/23, another on 12/04/23, no acute injuries.   Background:   Past Medical History:  Diagnosis Date   Anemia    a. mild, noted 04/2017.   CAD in native artery    a. [place]  04/2017 s/p DES to D1, DES to prox-mid LAD, DES to prox LAD overlapping the prior stent, LVEF 55-65%.    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 went home on home O2 04/2017.   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 Wonda & Dr. Lucas   Sinus bradycardia    a. HR 30s-40s in 04/2017 -> diltiazem  stopped, metoprolol  reduced.   Sleep apnea    chose not to order CPAP at this time (05/18/2017)   Type 2 diabetes mellitus (HCC)    Wears glasses     Assessment: Patient Reported Symptoms:  Cognitive Cognitive Status: Alert and oriented to person, place, and time, Normal speech and language skills, Insightful and able to interpret abstract concepts, No symptoms reported      Neurological Neurological Review of Symptoms: Numbness  (Has phantom feeling in left big toe which has been amputated.)    HEENT HEENT Symptoms Reported: No symptoms reported HEENT Comment: Dentist is working with him due to losing teeth.    Cardiovascular Cardiovascular Symptoms Reported: No symptoms reported Cardiovascular Comment: Weighs daily, takes torsemide  as prescribed, knows to call Cardio if he gains 3lbs in one day or 5lbs in one week.  Respiratory Additional Respiratory Details: He uses oxygen  2L as needed for shortness of breath. Has pulse ox to check levels, discussed placing oxygen  if pulse ox goes below 90-92% or with SHOB.    Endocrine Endocrine Symptoms Reported: No symptoms reported Is patient diabetic?: Yes Is patient checking blood sugars at home?: Yes List most recent blood sugar readings, include date and time of day: Today's FBG: 77mg /dL Endocrine Comment: Managed by PCP, last A1C 6.8% on 02/07/23.  Gastrointestinal Gastrointestinal Symptoms Reported: No symptoms reported Gastrointestinal Management Strategies: Diet modification Gastrointestinal Comment: Has regular BM's, takes Metamucil at night.    Genitourinary Genitourinary Symptoms Reported: No symptoms reported    Integumentary Integumentary Symptoms Reported: No symptoms reported    Musculoskeletal Musculoskelatal Symptoms Reviewed: Limited mobility Other Musculoskeletal Symptoms: Loss of ALL toes on left and right foot, partial amputation of right foot - this causes imbalance. Additional Musculoskeletal Details: [place] twice in one month - once on Labor  Day, another fall 12/04/23, no injuries.  He is being more careful with taking 30-40 seconds to stand before taking off to walk, using rollator at all times.   Falls in the past year?: Yes Number of falls in past year: 2 or more    Psychosocial Psychosocial Symptoms Reported: No symptoms reported          12/06/2023    PHQ2-9 Depression Screening   Little interest or pleasure in doing things    Feeling  down, depressed, or hopeless    PHQ-2 - Total Score    Trouble falling or staying asleep, or sleeping too much    Feeling tired or having little energy    Poor appetite or overeating     Feeling bad about yourself - or that you are a failure or have let yourself or your family down    Trouble concentrating on things, such as reading the newspaper or watching television    Moving or speaking so slowly that other people could have noticed.  Or the opposite - being so fidgety or restless that you have been moving around a lot more than usual    Thoughts that you would be better off dead, or hurting yourself in some way    PHQ2-9 Total Score    If you checked off any problems, how difficult have these problems made it for you to do your work, take care of things at home, or get along with other people    Depression Interventions/Treatment      There were no vitals filed for this visit.  Medications Reviewed Today     Reviewed by Lucian Santana LABOR, RN (Registered Nurse) on 12/06/23 at 1032  Med List Status: <None>   Medication Order Taking? Sig Documenting Provider Last Dose Status Informant  Accu-Chek Softclix Lancets lancets [id number]   [provider]  Active Self  acetaminophen  (TYLENOL ) 500 MG tablet [id number]  Take 1 tablet (500 mg total) by mouth every 6 (six) hours as needed for mild pain or headache. Milissa Tod PARAS, MD  Active Self  Alcohol  Swabs (B-D SINGLE USE SWABS REGULAR) PADS [id number]    Patient taking differently: No sig reported   [provider]  Active Self  amLODipine  (NORVASC ) 2.5 MG tablet [id number]  TAKE 1 TABLET EVERY DAY Branch, Dorn FALCON, MD  Active   amoxicillin  (AMOXIL ) 500 MG capsule [id number]  Take 4 capsules (2,000 mg total) one hour prior to dental work  Patient not taking: Reported on 10/30/2023   Sebastian Lamarr SAUNDERS, PA-C  Active   apixaban  (ELIQUIS ) 2.5 MG TABS tablet [id number] Yes Take 2.5 mg by mouth 2 (two) times daily. [provider]  Active Self  atorvastatin  (LIPITOR ) 40 MG tablet [id number]  Take 40 mg by mouth daily at 6 PM. [provider]  Active Self           Med Note CLAUD MICHEAL ONEIDA [place] Feb 02, 2023  1:44 PM)    Blood Glucose Monitoring Suppl (ACCU-CHEK GUIDE) w/Device KIT [id number] Yes  [provider]  Active Self  Cholecalciferol  (VITAMIN D3) 50 MCG (2000 UT) TABS [id number]  Take 2,000 Units by mouth every evening. [provider]  Active Self  clopidogrel  (PLAVIX ) 75 MG tablet [id number]  Take 1 tablet (75 mg total) by mouth daily with breakfast. Charlyne Reed, PA-C  Active Self           Med Note CLAUD MICHEAL ONEIDA [place] Feb 02, 2023  1:44 PM)    Cyanocobalamin  (B-12) 1000 MCG TABS [id number]  Take 2,000 mcg by mouth in the morning. [provider]  Active Self  diazepam  (VALIUM ) 2 MG tablet [id number]  Take 2 mg by mouth daily as needed for anxiety. [provider]  Active Self  DROPLET INSULIN  SYRINGE 31G X 5/16 1 ML MISC [id number]   [provider]  Active Self  DROPLET PEN NEEDLES 32G X 4 MM MISC [id number]   [provider]  Active   folic acid  (FOLVITE ) 800 MCG tablet [id number]  Take 400 mcg by mouth at bedtime. [provider]  Active Self  insulin  glargine (LANTUS ) 100 UNIT/ML injection [id number] Yes Inject 30 Units into the skin 2 (two) times daily. [provider]  Active Self           Med Note JIM, MEREDITH A   Tue Feb 07, 2023  9:28 AM) Took 15 units PM 02/06/23  ipratropium-albuterol  (DUONEB) 0.5-2.5 (3) MG/3ML SOLN [id number]  Take 3 mLs by nebulization every 6 (six) hours as needed (wheezing/shortness of breath). [provider]  Active Self  isosorbide  mononitrate (IMDUR ) 60 MG 24 hr tablet [id number]  TAKE 1 TABLET TWICE DAILY Thompson, Kathryn R, PA-C  Active   metoprolol  tartrate (LOPRESSOR ) 100 MG tablet [id number]  Take by mouth. [provider]  Active   nitroGLYCERIN   (NITROSTAT ) 0.4 MG SL tablet [id number]  PLACE 1 TABLET (0.4 MG TOTAL) UNDER THE TONGUE EVERY 5 (FIVE) MINUTES AS NEEDED FOR CHEST PAIN. Miriam Norris, NP  Active   OXYGEN  [id number]  Inhale 2 L/min into the lungs See admin instructions. 2 L/min of oxygen  at bedtime and during the day as needed for shortness of breath [provider]  Active Self  pantoprazole  (PROTONIX ) 40 MG tablet [id number]  Take 40 mg by mouth 2 (two) times daily. [provider]  Active Self  Potassium Chloride  ER 20 MEQ TBCR [id number]  Take 1 tablet by mouth daily. [provider]  Active   potassium chloride  SA (KLOR-CON  M) 20 MEQ tablet [id number]  Take 1 tablet (20 mEq total) by mouth 2 (two) times daily. Willette Adriana LABOR, MD  Active Self  psyllium (REGULOID) 0.52 g capsule [id number] Yes Take 0.52 g by mouth 2 (two) times daily. [provider]  Active Self  SPIRIVA  HANDIHALER 18 MCG inhalation capsule [id number]  Place 1 capsule into inhaler and inhale daily after breakfast.  [provider]  Active Self  torsemide  (DEMADEX ) 20 MG tablet [id number] Yes Take 1 tablet (20 mg total) by mouth 2 (two) times daily. Alvan Dorn FALCON, MD  Active   traZODone  (DESYREL ) 50 MG tablet [id number]  Take 100 mg by mouth at bedtime. [provider]  Active Self           Med Note JACKOLYN APOLINAR SAUNDERS   Wed Nov 16, 2022 11:26 AM)    TRUE METRIX BLOOD GLUCOSE TEST test strip [id number]   [provider]  Active Self            Recommendation:   Patient will take care when going from sitting to standing, standing in place for at least 30-40 seconds before ambulating, will use rollator at all times with ambulation.  He understands the danger of falling and hitting his head, needing to go to ED for assessment due bleeding risk r/t Eliquis , understands his risk for breaking pelvis or hip with a fall.    Specialty provider follow-up :  Cardiology 01/25/24   Follow Up Plan:    Telephone follow-up in 1 month  Santana Stamp BSN, CCM   [facility] Population Health RN Care Manager Direct Dial: [phone]  Fax: [phone]

## 2023-12-06 NOTE — Patient Instructions (Signed)
 Visit Information  Thank you for taking time to visit with me today. Please don't hesitate to contact me if I can be of assistance to you before our next scheduled appointment.  Your next care management appointment is by telephone on Wednesday, October 8th at 10:00am.   Please call the care guide team at [phone] if you need to cancel, schedule, or reschedule an appointment.   A reminder to ALL patients/family/friends, please call the [place]  National Suicide Prevention Lifeline: 801-160-7086 or TTY: 980 247 3477 TTY 4157702176) to talk to a trained counselor if you are experiencing a Mental Health or Behavioral Health Crisis or need someone to talk to.  Santana Stamp BSN, CCM [place]  [facility] Population Health RN Care Manager Direct Dial: [phone]  Fax: [phone]

## 2023-12-20 ENCOUNTER — Other Ambulatory Visit: Payer: Self-pay

## 2023-12-20 MED ORDER — ISOSORBIDE MONONITRATE ER 60 MG PO TB24: 60.0000 mg | ORAL_TABLET | Freq: Two times a day (BID) | ORAL | 2 refills | Status: DC

## 2024-01-03 ENCOUNTER — Encounter: Payer: Self-pay | Admitting: *Deleted

## 2024-01-03 ENCOUNTER — Telehealth: Payer: Self-pay | Admitting: *Deleted

## 2024-01-03 NOTE — Patient Instructions (Signed)
 Mike Wright - I am sorry I was unable to reach you today for our scheduled appointment. I work with Maree Isles, MD and am calling to support your healthcare needs. I have scheduled you for a call, tomorrow, 01/04/24 at 1:30. If you need anything sooner, please give me a call. I look forward to speaking with you soon.   Thank you,   Josette Pellet, RN, BSN [place]  [facility] RN Care Manager Direct Dial: [phone]  Fax: [phone]

## 2024-01-04 ENCOUNTER — Other Ambulatory Visit: Payer: Self-pay | Admitting: *Deleted

## 2024-01-04 ENCOUNTER — Encounter: Payer: Self-pay | Admitting: *Deleted

## 2024-01-10 ENCOUNTER — Encounter: Payer: Self-pay | Admitting: *Deleted

## 2024-01-10 ENCOUNTER — Other Ambulatory Visit: Payer: Self-pay | Admitting: *Deleted

## 2024-01-22 NOTE — Progress Notes (Unsigned)
 [facility]   MULTIDISCIPLINARY HEART VALVE CLINIC                                     Cardiology Office Note:    Date:  01/25/2024   ID:  Mike Wright 1934/02/20, MRN [id number]  PCP:  Maree Isles, MD  [facility] Cardiologist:  Alvan Carrier, MD  [facility] Structural heart: Ozell Fell, MD [facility] Electrophysiologist:  None   Referring MD: Maree Isles, MD   1 year s/p TAVR  History of Present Illness:    Mike Wright is a 89 y.o. male with a hx of CAD s/p PCI to D1/mLAD/pLAD 2019, HFpEF with 07/2022 admission, COPD home O2 PRN, persistent Afib on eliquis , HIT, PAD with previous toe amputations, balance issues, neuropathy, CKD stage IIIa, chronic anemia/thrombocytopenia, history of encephalopathy, T2DM, HTN, HLD, OSA not on CPAP and severe AS s/p TAVR (02/07/23) who presents to clinic for follow up.   Mike Wright is followed by Dr. Alvan for his cardiology care. He was admitted from 4/22-07/27/22 for acute CHF. He had issues with encephalopathy and sundowning. He was also seen by palliative care during this admission. He was discharge on 3L of home oxygen . He has had progressive aortic stenosis. Echo 07/19/22 showed EF 70% and severe aortic stenosis with a mean gradient at 52 mmHg, AVA 0.80 cm2, and mod-severe TR. [facility] 12/16/22 showed continued patency of the stented segments in the LAD and diagonal, nonobstructive plaquing in the circumflex and RCA, and no high-grade stenoses. S/p TAVR with a 26 mm Edwards Sapien 3 THV via the TF approach on 02/07/23. Post operative echo showed EF 65%, mod RVE, normally functioning TAVR with a mean gradient of 9.5 mmHg and no PVL as well as mod MAC and mild MS. Patient was unresponsive after TAVR for a prolonged period of time. Imaging negative for acute CVA and EEG was negative. He returned to a baseline status. Continued on home Eliquis  5mg  BID and Plavix  75mg  daily. Discharged with HHPT.    Today the patient  presents to clinic for follow up. Here with wife and neice. Mostly liimited by balance. Walks with a walker. Occasional fatigue. He has chronic CP that has not changed in frequency or severity. Resolves with SL NTG. Occasional SOB. He has some mild LE edema that is not bothersome, occasional orthopnea but no PND. No dizziness or syncope. No blood in stool or urine. No palpitations. Not taking metoprolol .    Past Medical History:  Diagnosis Date   Anemia    a. mild, noted 04/2017.   CAD in native artery    a. [place]  04/2017 s/p DES to D1, DES to prox-mid LAD, DES to prox LAD overlapping the prior stent, LVEF 55-65%.    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 went home on home O2 04/2017.   Insomnia    Mixed hyperlipidemia    PAD (peripheral artery disease)    Renal insufficiency  Retinal hemorrhage    lost 90% of vision.   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 Wonda & Dr. Lucas   Sinus bradycardia    a. HR 30s-40s in 04/2017 -> diltiazem  stopped, metoprolol  reduced.   Sleep apnea    chose not to order CPAP at this time (05/18/2017)   Type 2 diabetes mellitus (HCC)    Wears glasses      Current Medications: Current Meds  Medication Sig   Accu-Chek Softclix Lancets lancets    acetaminophen  (TYLENOL ) 500 MG tablet Take 1 tablet (500 mg total) by mouth every 6 (six) hours as needed for mild pain or headache.   Alcohol  Swabs (B-D SINGLE USE SWABS REGULAR) PADS    amLODipine  (NORVASC ) 2.5 MG tablet TAKE 1 TABLET EVERY DAY    amoxicillin  (AMOXIL ) 500 MG capsule Take 4 capsules (2,000 mg total) one hour prior to dental work   apixaban  (ELIQUIS ) 2.5 MG TABS tablet Take 2.5 mg by mouth 2 (two) times daily.   atorvastatin  (LIPITOR ) 40 MG tablet Take 40 mg by mouth daily at 6 PM.   Blood Glucose Monitoring Suppl (ACCU-CHEK GUIDE) w/Device KIT    Cholecalciferol  (VITAMIN D3) 50 MCG (2000 UT) TABS Take 2,000 Units by mouth every evening.   clopidogrel  (PLAVIX ) 75 MG tablet Take 1 tablet (75 mg total) by mouth daily with breakfast.   Cyanocobalamin  (B-12) 1000 MCG TABS Take 2,000 mcg by mouth in the morning.   diazepam  (VALIUM ) 2 MG tablet Take 2 mg by mouth daily as needed for anxiety.   DROPLET INSULIN  SYRINGE 31G X 5/16 1 ML MISC    DROPLET PEN NEEDLES 32G X 4 MM MISC    folic acid  (FOLVITE ) 800 MCG tablet Take 400 mcg by mouth at bedtime.   insulin  glargine (LANTUS ) 100 UNIT/ML injection Inject 30 Units into the skin 2 (two) times daily.   ipratropium-albuterol  (DUONEB) 0.5-2.5 (3) MG/3ML SOLN Take 3 mLs by nebulization every 6 (six) hours as needed (wheezing/shortness of breath).   metoprolol  tartrate (LOPRESSOR ) 100 MG tablet Take by mouth.   nitroGLYCERIN  (NITROSTAT ) 0.4 MG SL tablet PLACE 1 TABLET (0.4 MG TOTAL) UNDER THE TONGUE EVERY 5 (FIVE) MINUTES AS NEEDED FOR CHEST PAIN.   OXYGEN  Inhale 2 L/min into the lungs See admin instructions. 2 L/min of oxygen  at bedtime and during the day as needed for shortness of breath   pantoprazole  (PROTONIX ) 40 MG tablet Take 40 mg by mouth 2 (two) times daily.   Potassium Chloride  ER 20 MEQ TBCR Take 1 tablet by mouth daily.   potassium chloride  SA (KLOR-CON  M) 20 MEQ tablet Take 1 tablet (20 mEq total) by mouth 2 (two) times daily.   psyllium (REGULOID) 0.52 g capsule Take 0.52 g by mouth 2 (two) times daily.   SPIRIVA  HANDIHALER 18 MCG inhalation capsule Place 1 capsule into inhaler and inhale daily after breakfast.    torsemide  (DEMADEX ) 20 MG tablet Take 1 tablet (20 mg  total) by mouth 2 (two) times daily.   traZODone  (DESYREL ) 50 MG tablet Take 100 mg by mouth at bedtime.   TRUE METRIX BLOOD GLUCOSE TEST test strip    [DISCONTINUED] isosorbide  mononitrate (IMDUR ) 60 MG 24 hr tablet Take 1 tablet (60 mg total) by mouth 2 (two) times daily.      ROS:   Please see the history of present illness.  All other systems reviewed and are negative.  EKGs       Risk Assessment/Calculations:    CHA2DS2-VASc Score =     This indicates a  % annual risk of stroke. The patient's score is based upon:           Physical Exam:    VS:  BP 138/64   Pulse 60   Ht 5' 7 (1.702 m)   Wt 163 lb 12.8 oz (74.3 kg)   SpO2 90%   BMI 25.65 kg/m     Wt Readings from Last 3 Encounters:  01/25/24 163 lb 12.8 oz (74.3 kg)  09/07/23 175 lb (79.4 kg)  07/31/23 165 lb 9.6 oz (75.1 kg)     GEN: Well nourished, well developed in no acute distress NECK: No JVD CARDIAC: irreg irreg, soft flow murmur. No rubs, gallops RESPIRATORY:  Clear to auscultation without rales, wheezing or rhonchi  ABDOMEN: Soft, non-tender, non-distended EXTREMITIES:  1+ edema with sock indentations.   ASSESSMENT:    1. S/P TAVR (transcatheter aortic valve replacement)   2. Coronary artery disease of native artery of native heart with stable angina pectoris   3. Chronic heart failure with preserved ejection fraction (HFpEF) (HCC)   4. Permanent atrial fibrillation (HCC)   5. CKD stage 3a, GFR 45-59 ml/min (HCC)   6. Microcytic anemia   7. PAD (peripheral artery disease)   8. Essential hypertension     PLAN:    In order of problems listed above:  Severe AS s/p TAVR:  -- Echo today shows EF 60%, mild RVE, normally functioning TAVR with a mean gradient of 8.8 mm hg and no PVL.  -- NYHA class II symptoms, mostly limited by balance issues.  -- Continue Eliquis  2.5mg  BID and Plavix  75mg  daily.  -- SBE discussed. He has amoxicillin .  -- Continue regular follow up with Dr.  Alvan.  CAD:  -- [facility] 12/16/22 showed continued patency of the stented segments in the LAD and diagonal, nonobstructive plaquing in the circumflex and RCA, and no high-grade stenoses.  -- Continue medical therapy with Plavix  75mg  daily and Lipitor  40mg  daily. -- He has chronic stable angina. -- Continue Norvasc  2.5mg  daily, Imdur  90mg  BID  -- Continue SL NTG PRN.   HFpEF:   -- Appears euvolemic although he does have a little swelling in his legs.  -- Continue Demadex  20mg  BID and KCL 20 meq daily.    Persistent atrial fibrillation:  -- Continue low dose Eliquis  2.5mg  BID.  -- No longer taking metoprolol  100mg  daily due to bradycardia. Taken off med list.    CKD Stage IIIa:  -- Creat 1.41 on labs in 03/2023, personally reviewed.    Chronic anemia/thrombocytopenia: -- Followed by heme/onc. Hg 12.2 /PLT 103 on labs in 03/2023, personally reviewed.    PAD:  --S/p multiple metatarsal amputations.  -- Follows with [facility].    HTN:  -- BP well controlled currently.  -- No changes made.   Medication Adjustments/Labs and Tests Ordered: Current medicines are reviewed at length with the patient today.  Concerns regarding medicines are outlined above.  No orders of the defined types were placed in this encounter.  Meds ordered this encounter  Medications   isosorbide  mononitrate (IMDUR ) 60 MG 24 hr tablet    Sig: Take 1.5 tablets (90 mg total) by mouth 2 (two) times daily.    Dispense:  270 tablet    Refill:  3    Supervising Provider:   WONDA, MICHAEL [[id number]]  Patient Instructions  Medication Instructions:  Your physician has recommended you make the following change in your medication: STOP taking metoprolol  if you have not stopped already. CONTINUE to take Imdur  90mg  twice daily.   *If you need a refill on your cardiac medications before your next appointment, please call your pharmacy*  Lab Work: None needed If you have labs (blood work) drawn today and your tests are  completely normal, you will receive your results only by: MyChart Message (if you have MyChart) OR A paper copy in the mail If you have any lab test that is abnormal or we need to change your treatment, we will call you to review the results.  Testing/Procedures: None needed  Follow-Up: At [facility], you and your health needs are our priority.  As part of our continuing mission to provide you with exceptional heart care, our providers are all part of one team.  This team includes your primary Cardiologist (physician) and Advanced Practice Providers or APPs (Physician Assistants and Nurse Practitioners) who all work together to provide you with the care you need, when you need it.  Your next appointment:   As scheduled on 04/09/24  Provider:   Dorn Ross, MD    We recommend signing up for the patient portal called MyChart.  Sign up information is provided on this After Visit Summary.  MyChart is used to connect with patients for Virtual Visits (Telemedicine).  Patients are able to view lab/test results, encounter notes, upcoming appointments, etc.  Non-urgent messages can be sent to your provider as well.   To learn more about what you can do with MyChart, go to [email or web address].           Signed, Mike Hummer, PA-C  01/25/2024 2:08 PM    [facility]

## 2024-01-24 ENCOUNTER — Ambulatory Visit: Payer: Medicare HMO

## 2024-01-24 ENCOUNTER — Other Ambulatory Visit (HOSPITAL_COMMUNITY): Payer: Medicare HMO

## 2024-01-25 ENCOUNTER — Ambulatory Visit: Admitting: Physician Assistant

## 2024-01-25 ENCOUNTER — Ambulatory Visit: Payer: Self-pay | Admitting: Physician Assistant

## 2024-01-25 ENCOUNTER — Ambulatory Visit (HOSPITAL_COMMUNITY)
Admission: RE | Admit: 2024-01-25 | Discharge: 2024-01-25 | Disposition: A | Discharge status: Home / Self Care | Source: Ambulatory Visit | Attending: Internal Medicine | Admitting: Internal Medicine

## 2024-01-25 VITALS — BP 138/64 | HR 60 | Ht 67.0 in | Wt 163.8 lb

## 2024-01-25 DIAGNOSIS — Z952 Presence of prosthetic heart valve: Secondary | ICD-10-CM | POA: Insufficient documentation

## 2024-01-25 DIAGNOSIS — D509 Iron deficiency anemia, unspecified: Secondary | ICD-10-CM | POA: Diagnosis not present

## 2024-01-25 DIAGNOSIS — N1831 Chronic kidney disease, stage 3a: Secondary | ICD-10-CM | POA: Diagnosis not present

## 2024-01-25 DIAGNOSIS — I739 Peripheral vascular disease, unspecified: Secondary | ICD-10-CM

## 2024-01-25 DIAGNOSIS — I5032 Chronic diastolic (congestive) heart failure: Secondary | ICD-10-CM | POA: Insufficient documentation

## 2024-01-25 DIAGNOSIS — I4821 Permanent atrial fibrillation: Secondary | ICD-10-CM

## 2024-01-25 DIAGNOSIS — I10 Essential (primary) hypertension: Secondary | ICD-10-CM

## 2024-01-25 DIAGNOSIS — I25118 Atherosclerotic heart disease of native coronary artery with other forms of angina pectoris: Secondary | ICD-10-CM

## 2024-01-25 LAB — ECHOCARDIOGRAM COMPLETE
AV Mean grad: 8.8 mmHg
AV Peak grad: 17 mmHg
Ao pk vel: 2.06 m/s
S' Lateral: 2.8 cm

## 2024-01-25 MED ORDER — ISOSORBIDE MONONITRATE ER 60 MG PO TB24: 90.0000 mg | ORAL_TABLET | Freq: Two times a day (BID) | ORAL | 3 refills | Status: AC

## 2024-01-25 NOTE — Patient Instructions (Signed)
 Medication Instructions:  Your physician has recommended you make the following change in your medication: STOP taking metoprolol  if you have not stopped already. CONTINUE to take Imdur  90mg  twice daily.   *If you need a refill on your cardiac medications before your next appointment, please call your pharmacy*  Lab Work: None needed If you have labs (blood work) drawn today and your tests are completely normal, you will receive your results only by: MyChart Message (if you have MyChart) OR A paper copy in the mail If you have any lab test that is abnormal or we need to change your treatment, we will call you to review the results.  Testing/Procedures: None needed  Follow-Up: At [facility], you and your health needs are our priority.  As part of our continuing mission to provide you with exceptional heart care, our providers are all part of one team.  This team includes your primary Cardiologist (physician) and Advanced Practice Providers or APPs (Physician Assistants and Nurse Practitioners) who all work together to provide you with the care you need, when you need it.  Your next appointment:   As scheduled on 04/09/24  Provider:   Dorn Ross, MD    We recommend signing up for the patient portal called MyChart.  Sign up information is provided on this After Visit Summary.  MyChart is used to connect with patients for Virtual Visits (Telemedicine).  Patients are able to view lab/test results, encounter notes, upcoming appointments, etc.  Non-urgent messages can be sent to your provider as well.   To learn more about what you can do with MyChart, go to [email or web address].

## 2024-02-01 DIAGNOSIS — E119 Type 2 diabetes mellitus without complications: Secondary | ICD-10-CM | POA: Diagnosis not present

## 2024-02-01 DIAGNOSIS — K861 Other chronic pancreatitis: Secondary | ICD-10-CM | POA: Diagnosis not present

## 2024-02-01 DIAGNOSIS — Z299 Encounter for prophylactic measures, unspecified: Secondary | ICD-10-CM | POA: Diagnosis not present

## 2024-02-01 DIAGNOSIS — Z23 Encounter for immunization: Secondary | ICD-10-CM | POA: Diagnosis not present

## 2024-02-01 DIAGNOSIS — I5032 Chronic diastolic (congestive) heart failure: Secondary | ICD-10-CM | POA: Diagnosis not present

## 2024-02-01 DIAGNOSIS — I10 Essential (primary) hypertension: Secondary | ICD-10-CM | POA: Diagnosis not present

## 2024-02-09 ENCOUNTER — Telehealth: Payer: Self-pay | Admitting: *Deleted

## 2024-02-09 ENCOUNTER — Telehealth: Payer: Self-pay

## 2024-02-09 NOTE — Progress Notes (Signed)
 Complex Care Management Care Guide Note  02/09/2024 Name: [name] MRN: [id number] DOB: August 28, 1933  Mike Wright is a 89 y.o. year old male who is a primary care patient of Maree Isles, MD and is actively engaged with the care management team. I reached out to Zachary LELON Arts by phone today to assist with re-scheduling  with the RN Case Manager.  Follow up plan: Telephone appointment with complex care management team member scheduled for:  02/13/24 at 11:30 a.m.   Dream[facility]  [facility], [facility] VBCI Assistant Direct Dial: [phone]  Fax: [phone]

## 2024-02-13 ENCOUNTER — Encounter: Payer: Self-pay | Admitting: *Deleted

## 2024-02-13 ENCOUNTER — Other Ambulatory Visit: Payer: Self-pay | Admitting: *Deleted

## 2024-02-23 ENCOUNTER — Other Ambulatory Visit: Payer: Self-pay | Admitting: *Deleted

## 2024-02-23 ENCOUNTER — Encounter: Payer: Self-pay | Admitting: *Deleted

## 2024-02-28 ENCOUNTER — Other Ambulatory Visit: Payer: Self-pay

## 2024-02-28 ENCOUNTER — Telehealth: Admitting: *Deleted

## 2024-02-28 NOTE — Patient Outreach (Signed)
 Complex Care Management   Visit Note  02/28/2024  Name:  Mike Wright MRN: [id number] DOB: 07-06-1933  Situation: Referral received for Complex Care Management related to COPD and hypoglycemia I obtained verbal consent from Patient.  Visit completed with Patient  on the phone  Background:   Past Medical History:  Diagnosis Date   Anemia    a. mild, noted 04/2017.   CAD in native artery    a. [place]  04/2017 s/p DES to D1, DES to prox-mid LAD, DES to prox LAD overlapping the prior stent, LVEF 55-65%.    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 went home on home O2 04/2017.   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 Wonda & Dr. Lucas   Sinus bradycardia    a. HR 30s-40s in 04/2017 -> diltiazem  stopped, metoprolol  reduced.   Sleep apnea    chose not to order CPAP at this time (05/18/2017)   Type 2 diabetes mellitus (HCC)    Wears glasses     Assessment: Patient Reported Symptoms:  Cognitive Cognitive Status: No symptoms reported   Health Maintenance Behaviors: None Healing Pattern: Average  Neurological Neurological Review of Symptoms: Other: [place] Neurological Symptoms/Conditions [RPT]: neuropathy in feet Neurological Management Strategies: Activity, Adequate rest Neurological Self-Management Outcome: 4 (good)  HEENT HEENT Symptoms Reported: No symptoms reported, Mouth or teeth  pain HEENT Management Strategies: Adequate rest, Coping strategies HEENT Self-Management Outcome: 4 (good)    Cardiovascular Cardiovascular Symptoms Reported: No symptoms reported Does patient have uncontrolled Hypertension?: Yes Is patient checking Blood Pressure at home?: No Cardiovascular Management Strategies: Adequate rest Cardiovascular Self-Management Outcome: 4 (good)  Respiratory Respiratory Symptoms Reported: No symptoms reported Additional Respiratory Details: uses 2lnc in the evening as needed Respiratory Management Strategies: Routine screening, Adequate rest Respiratory Self-Management Outcome: 4 (good)  Endocrine Endocrine Symptoms Reported: No symptoms reported Is patient diabetic?: Yes Is patient checking blood sugars at home?: Yes List most recent blood sugar readings, include date and time of day: today FBS is 122 low 72 over the last few days Endocrine Self-Management Outcome: 4 (good)  Gastrointestinal Gastrointestinal Symptoms Reported: No symptoms reported Gastrointestinal Management Strategies: Adequate rest Gastrointestinal Self-Management Outcome: 4 (good)    Genitourinary Genitourinary Symptoms Reported: No symptoms reported Genitourinary Management Strategies: Adequate rest Genitourinary Self-Management Outcome: 4 (good)  Integumentary Integumentary Symptoms Reported: No symptoms reported Skin Self-Management Outcome: 4 (good)  Musculoskeletal Musculoskelatal Symptoms Reviewed: Limited mobility Other Musculoskeletal Symptoms: missing toes Musculoskeletal Management Strategies: Routine screening Musculoskeletal Self-Management Outcome: 3 (uncertain) Falls in the past year?: No Number of falls in past year: 1 or less Was there an injury with Fall?: No Fall Risk Category Calculator: 0 Patient Fall Risk Level: Low Fall Risk Patient at Risk for Falls Due to: History of fall(s)  Fall risk Follow up: Falls evaluation completed, Education provided, Falls  prevention discussed  Psychosocial Psychosocial Symptoms Reported: No symptoms reported Behavioral Management Strategies: Support system Behavioral Health Self-Management Outcome: 4 (good)   Quality of Family Relationships: supportive, involved Do you feel physically threatened by others?: No    02/28/2024    PHQ2-9 Depression Screening   Little interest or pleasure in doing things Not at all  Feeling down, depressed, or hopeless Not at all  PHQ-2 - Total Score 0  Trouble falling or staying asleep, or sleeping too much    Feeling tired or having little energy    Poor appetite or overeating     Feeling bad about yourself - or that you are a failure or have let yourself or your family down    Trouble concentrating on things, such as reading the newspaper or watching television    Moving or speaking so slowly that other people could have noticed.  Or the opposite - being so fidgety or restless that you have been moving around a lot more than usual    Thoughts that you would be better off dead, or hurting yourself in some way    PHQ2-9 Total Score    If you checked off any problems, how difficult have these problems made it for you to do your work, take care of things at home, or get along with other people    Depression Interventions/Treatment      There were no vitals filed for this visit. Pain Scale: 0-10 Pain Score: 0-No pain Multiple Pain Sites: No  Medications Reviewed Today     Reviewed by Nivia, Alima Naser , RN (Registered Nurse) on 02/28/24 at 1035  Med List Status: <None>   Medication Order Taking? Sig Documenting Provider Last Dose Status Informant  Accu-Chek Softclix Lancets lancets [id number] Yes  [provider]  Active Self  acetaminophen  (TYLENOL ) 500 MG tablet [id number] Yes Take 1 tablet (500 mg total) by mouth every 6 (six) hours as needed for mild pain or headache. Milissa Tod PARAS, MD  Active Self  Alcohol  Swabs (B-D SINGLE USE SWABS REGULAR) PADS [id number]  Yes  [provider]  Active Self  amLODipine  (NORVASC ) 2.5 MG tablet [id number] Yes TAKE 1 TABLET EVERY DAY Branch, Dorn FALCON, MD  Active   amoxicillin  (AMOXIL ) 500 MG capsule [id number] Yes Take 4 capsules (2,000 mg total) one hour prior to dental work Sebastian, Lamarr SAUNDERS, PA-C  Active   apixaban  (ELIQUIS ) 2.5 MG TABS tablet [id number] Yes Take 2.5 mg by mouth 2 (two) times daily. [provider]  Active Self  atorvastatin  (LIPITOR ) 40 MG tablet [id number] Yes Take 40 mg by mouth daily at 6 PM. [provider]  Active Self           Med Note CLAUD MICHEAL ONEIDA [place] Feb 02, 2023  1:44 PM)    Blood Glucose Monitoring Suppl (ACCU-CHEK GUIDE) w/Device KIT [id number] Yes  [provider]  Active Self  Cholecalciferol  (VITAMIN D3) 50 MCG (2000 UT) TABS [id number] Yes Take 2,000 Units by mouth every evening. [provider]  Active Self  clopidogrel  (PLAVIX ) 75 MG tablet [id number] Yes Take 1 tablet (75 mg total) by mouth daily with breakfast. Charlyne Reed, PA-C  Active Self           Med Note CLAUD MICHEAL ONEIDA [place] Feb 02, 2023  1:44 PM)    Cyanocobalamin  (B-12) 1000 MCG TABS [id number] Yes Take 2,000 mcg by mouth in  the morning. [provider]  Active Self  diazepam  (VALIUM ) 2 MG tablet [id number] Yes Take 2 mg by mouth daily as needed for anxiety. [provider]  Active Self  DROPLET INSULIN  SYRINGE 31G X 5/16 1 ML MISC [id number] Yes  [provider]  Active Self  DROPLET PEN NEEDLES 32G X 4 MM MISC [id number] Yes  [provider]  Active   folic acid  (FOLVITE ) 800 MCG tablet [id number] Yes Take 400 mcg by mouth at bedtime. [provider]  Active Self  insulin  glargine (LANTUS ) 100 UNIT/ML injection [id number] Yes Inject 30 Units into the skin 2 (two) times daily. [provider]  Active Self           Med Note JIM, MEREDITH A   Tue Feb 07, 2023  9:28 AM) Took 15 units PM 02/06/23   ipratropium-albuterol  (DUONEB) 0.5-2.5 (3) MG/3ML SOLN [id number] Yes Take 3 mLs by nebulization every 6 (six) hours as needed (wheezing/shortness of breath). [provider]  Active Self  isosorbide  mononitrate (IMDUR ) 60 MG 24 hr tablet [id number] Yes Take 1.5 tablets (90 mg total) by mouth 2 (two) times daily. Sebastian Lamarr SAUNDERS, PA-C  Active   metoprolol  tartrate (LOPRESSOR ) 100 MG tablet [id number] Yes Take by mouth. [provider]  Active   nitroGLYCERIN  (NITROSTAT ) 0.4 MG SL tablet [id number] Yes PLACE 1 TABLET (0.4 MG TOTAL) UNDER THE TONGUE EVERY 5 (FIVE) MINUTES AS NEEDED FOR CHEST PAIN. Miriam Norris, NP  Active   OXYGEN  [id number] Yes Inhale 2 L/min into the lungs See admin instructions. 2 L/min of oxygen  at bedtime and during the day as needed for shortness of breath [provider]  Active Self  pantoprazole  (PROTONIX ) 40 MG tablet [id number] Yes Take 40 mg by mouth 2 (two) times daily. [provider]  Active Self  Potassium Chloride  ER 20 MEQ TBCR [id number] Yes Take 1 tablet by mouth daily. [provider]  Active   potassium chloride  SA (KLOR-CON  M) 20 MEQ tablet [id number] Yes Take 1 tablet (20 mEq total) by mouth 2 (two) times daily. Willette Adriana LABOR, MD  Active Self  psyllium (REGULOID) 0.52 g capsule [id number] Yes Take 0.52 g by mouth 2 (two) times daily. [provider]  Active Self  SPIRIVA  HANDIHALER 18 MCG inhalation capsule [id number] Yes Place 1 capsule into inhaler and inhale daily after breakfast.  [provider]  Active Self  torsemide  (DEMADEX ) 20 MG tablet [id number] Yes Take 1 tablet (20 mg total) by mouth 2 (two) times daily. Alvan Dorn FALCON, MD  Active   traZODone  (DESYREL ) 50 MG tablet [id number] Yes Take 100 mg by mouth at bedtime. [provider]  Active Self           Med Note JACKOLYN APOLINAR SAUNDERS   Wed Nov 16, 2022 11:26 AM)    TRUE METRIX BLOOD GLUCOSE TEST test strip [id number] Yes   [provider]  Active Self            Recommendation:   Continue Current Plan of Care  Follow Up Plan:   Telephone follow-up in 1 month Jacobe Study RN RN Care Manager [facility] [phone]

## 2024-02-28 NOTE — Patient Instructions (Addendum)
 Visit Information  Thank you for taking time to visit with me today. Please don't hesitate to contact me if I can be of assistance to you before our next scheduled appointment.  Your next care management appointment is by telephone on 04/02/23 at 11:00am  Telephone follow-up in 1 month  Please call the care guide team at [phone] if you need to cancel, schedule, or reschedule an appointment.   Please call the Suicide and Crisis Lifeline: 988 if you are experiencing a Mental Health or Behavioral Health Crisis or need someone to talk to.  Mckenze Slone RN RN Care Manager [facility] [phone] Visit Information  Thank you for taking time to visit with me today. Please don't hesitate to contact me if I can be of assistance to you before our next scheduled appointment.  Your next care management appointment is by telephone on 04/02/23 at 11:00am  Telephone follow-up in 1 month  Please call the care guide team at [phone] if you need to cancel, schedule, or reschedule an appointment.   Please call the Suicide and Crisis Lifeline: 988 call the [place]  National Suicide Prevention Lifeline: (304)555-1055 or TTY: (407) 502-1830 TTY (820)268-7181) to talk to a trained counselor call 1-800-273-TALK (toll free, 24 hour hotline) if you are experiencing a Mental Health or Behavioral Health Crisis or need someone to talk to.  Hatsue Sime RN RN Care Manager [facility] [phone]

## 2024-03-07 ENCOUNTER — Telehealth: Payer: Self-pay | Admitting: Physician Assistant

## 2024-03-07 NOTE — Telephone Encounter (Signed)
 Pt aware amoxicillin  has been sent into [facility] as requested.

## 2024-03-07 NOTE — Telephone Encounter (Signed)
 Wife Therapist, Music) wants a call back to confirm prescription has been sent.

## 2024-03-07 NOTE — Telephone Encounter (Signed)
°*  STAT* If patient is at the pharmacy, call can be transferred to refill team.   1. Which medications need to be refilled? (please list name of each medication and dose if known)   amoxicillin  (AMOXIL ) 500 MG capsule   2. Would you like to learn more about the convenience, safety, & potential cost savings by using the [facility]?   3. Are you open to using the [facility] (Type [facility]. ).  4. Which pharmacy/location (including street and city if local pharmacy) is medication to be sent to?  [facility] - [place], [place] - [address]   5. Do they need a 30 day or 90 day supply?   Wife Therapist, Music) stated patient is completely out of this medication and patient will be having further dental work.  Patient has appointment scheduled with Dr. Alvan on 04/09/24.

## 2024-03-13 ENCOUNTER — Other Ambulatory Visit: Payer: Self-pay | Admitting: Physician Assistant

## 2024-03-15 NOTE — Patient Instructions (Signed)
 Visit Information  Thank you for taking time to visit with me today. Please don't hesitate to contact me if I can be of assistance to you before our next scheduled appointment.  Your next care management appointment is by telephone on 02/13/24  Please call the care guide team at [phone] if you need to cancel, schedule, or reschedule an appointment.   Please call the [facility]: [phone] call 911 if you are experiencing a Mental Health or Behavioral Health Crisis or need someone to talk to.  Josette Pellet, RN, BSN [place]  [facility] RN Care Manager Direct Dial: [phone]  Fax: [phone]

## 2024-03-15 NOTE — Patient Outreach (Signed)
 Complex Care Management   Visit Note  02/23/2024  Name:  Mike Wright MRN: [id number] DOB: Apr 06, 1933  Situation: Referral received for Complex Care Management related to Diabetes with Complications. Today's visit was focused on dental needs. I obtained verbal consent from Patient.  Visit completed with Patient  on the phone  Background:   Past Medical History:  Diagnosis Date   Anemia    a. mild, noted 04/2017.   CAD in native artery    a. [place]  04/2017 s/p DES to D1, DES to prox-mid LAD, DES to prox LAD overlapping the prior stent, LVEF 55-65%.    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 went home on home O2 04/2017.   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 Wonda & Dr. Lucas   Sinus bradycardia    a. HR 30s-40s in 04/2017 -> diltiazem  stopped, metoprolol  reduced.   Sleep apnea    chose not to order CPAP at this time (05/18/2017)   Type 2 diabetes mellitus (HCC)    Wears glasses     Assessment: Patient Reported Symptoms:  Cognitive Cognitive Status: Alert and oriented to person, place, and time, Normal speech and language skills, No symptoms reported Cognitive/Intellectual Conditions Management [RPT]: None reported or documented in medical history or problem list   Health Maintenance Behaviors: Annual physical exam Healing Pattern: Unsure Health Facilitated by: Rest   Neurological Neurological Review of Symptoms: Not assessed    HEENT HEENT Symptoms Reported: Mouth or teeth pain HEENT Management Strategies: Routine screening HEENT Self-Management Outcome: 4 (good) HEENT Comment: Had teeth removed with oral surgeon 02/15/24. Had some bleeding on 02/16/24 but it resolved. Scheduled to have remainder of teeth removed on 02/26/24.    Cardiovascular Cardiovascular Symptoms Reported: Not assessed    Respiratory Respiratory Symptoms Reported: Not assesed    Endocrine Endocrine Symptoms Reported: Not assessed    Gastrointestinal Gastrointestinal Symptoms Reported: Not assessed      Genitourinary Genitourinary Symptoms Reported: Not assessed    Integumentary Integumentary Symptoms Reported: Not assessed    Musculoskeletal Musculoskelatal Symptoms Reviewed: Not assessed        Psychosocial Psychosocial Symptoms Reported: Not assessed          03/15/2024    PHQ2-9 Depression Screening   Little interest or pleasure in doing things    Feeling down, depressed, or hopeless    PHQ-2 - Total Score    Trouble falling or staying asleep, or sleeping too much    Feeling tired or having little energy    Poor appetite or overeating     Feeling bad about yourself - or that you are a failure or have let yourself or your family down    Trouble concentrating on things, such as reading the newspaper or watching  television    Moving or speaking so slowly that other people could have noticed.  Or the opposite - being so fidgety or restless that you have been moving around a lot more than usual    Thoughts that you would be better off dead, or hurting yourself in some way    PHQ2-9 Total Score    If you checked off any problems, how difficult have these problems made it for you to do your work, take care of things at home, or get along with other people    Depression Interventions/Treatment      There were no vitals filed for this visit.    Medications  Reviewed Today     Reviewed by Charlsie Josette SAILOR, RN (Registered Nurse) on 03/15/24 at 1240  Med List Status: <None>   Medication Order Taking? Sig Documenting Provider Last Dose Status Informant  Accu-Chek Softclix Lancets lancets [id number] Yes  [provider]  Active Self  acetaminophen  (TYLENOL ) 500 MG tablet [id number] Yes Take 1 tablet (500 mg total) by mouth every 6 (six) hours as needed for mild pain or headache. Milissa Tod PARAS, MD  Active Self  Alcohol  Swabs (B-D SINGLE USE SWABS REGULAR) PADS [id number] Yes  [provider]  Active Self  amLODipine  (NORVASC ) 2.5 MG tablet [id number] Yes TAKE 1 TABLET EVERY DAY Alvan Dorn FALCON, MD  Active     Discontinued 03/07/24 1600   apixaban  (ELIQUIS ) 2.5 MG TABS tablet [id number] Yes Take 2.5 mg by mouth 2 (two) times daily. [provider]  Active Self  atorvastatin  (LIPITOR ) 40 MG tablet [id number] Yes Take 40 mg by mouth daily at 6 PM. [provider]  Active Self           Med Note CLAUD MICHEAL ONEIDA [place] Feb 02, 2023  1:44 PM)    Blood Glucose Monitoring Suppl (ACCU-CHEK GUIDE) w/Device KIT [id number] Yes  [provider]  Active Self  Cholecalciferol  (VITAMIN D3) 50 MCG (2000 UT) TABS [id number] Yes Take 2,000 Units by mouth every evening. [provider]  Active Self  clopidogrel  (PLAVIX ) 75 MG tablet [id number] Yes Take 1 tablet (75 mg total) by mouth daily with breakfast. Charlyne Reed, PA-C  Active Self           Med Note CLAUD MICHEAL ONEIDA [place] Feb 02, 2023  1:44 PM)    Cyanocobalamin  (B-12) 1000 MCG TABS [id number] Yes Take 2,000 mcg by mouth in the morning. [provider]  Active Self  diazepam  (VALIUM ) 2 MG tablet [id number] Yes Take 2 mg by mouth daily as needed for anxiety. [provider]  Active Self  DROPLET INSULIN  SYRINGE 31G X 5/16 1 ML MISC [id number] Yes  [provider]  Active Self  DROPLET PEN NEEDLES 32G X 4 MM MISC [id number] Yes  [provider]  Active   folic acid  (FOLVITE ) 800 MCG tablet [id number] Yes Take 400 mcg by mouth at bedtime. [provider]  Active Self  insulin  glargine (LANTUS ) 100 UNIT/ML injection [id number] Yes Inject 30 Units into the skin 2 (two) times daily. [provider]  Active Self           Med Note JIM, MEREDITH A   Tue Feb 07, 2023  9:28 AM) Took 15 units PM 02/06/23  ipratropium-albuterol  (DUONEB) 0.5-2.5 (3) MG/3ML SOLN [id number] Yes Take 3 mLs by nebulization every 6 (six) hours as needed (wheezing/shortness of breath). [provider]  Active Self  isosorbide  mononitrate (IMDUR ) 60 MG 24 hr tablet [id number] Yes Take 1.5 tablets (90 mg total) by mouth 2 (two) times daily. Sebastian Lamarr SAUNDERS, PA-C  Active   metoprolol  tartrate (LOPRESSOR ) 100 MG tablet [id number] Yes Take by mouth. [provider]  Active   nitroGLYCERIN  (NITROSTAT ) 0.4 MG SL tablet [id number] Yes PLACE 1 TABLET (0.4 MG TOTAL) UNDER THE TONGUE EVERY 5 (FIVE) MINUTES AS NEEDED FOR CHEST PAIN. Miriam Norris, NP  Active   OXYGEN  [id number] Yes Inhale 2 L/min into the lungs See admin instructions. 2 L/min of oxygen  at bedtime and during the day as needed for shortness of breath [provider]  Active Self  pantoprazole  (PROTONIX ) 40 MG tablet [id number] Yes Take 40 mg by mouth 2 (two) times daily. [provider]  Active Self  Potassium Chloride  ER 20 MEQ TBCR [id number] Yes Take 1 tablet by mouth daily. [provider]  Active   potassium chloride  SA (KLOR-CON  M) 20 MEQ tablet [id number] Yes Take 1 tablet (20 mEq total) by mouth 2 (two) times daily. Willette Adriana LABOR, MD  Active Self  psyllium (REGULOID) 0.52 g capsule [id number] Yes Take 0.52 g by mouth 2 (two) times daily. [provider]  Active Self  SPIRIVA  HANDIHALER 18 MCG inhalation capsule [id number] Yes Place 1 capsule into inhaler and inhale daily after breakfast.  [provider]  Active Self   torsemide  (DEMADEX ) 20 MG tablet [id number] Yes Take 1 tablet (20 mg total) by mouth 2 (two) times daily. Alvan Dorn FALCON, MD  Active   traZODone  (DESYREL ) 50 MG tablet [id number] Yes Take 100 mg by mouth at bedtime. [provider]  Active Self           Med Note JACKOLYN APOLINAR SAUNDERS   Wed Nov 16, 2022 11:26 AM)    TRUE METRIX BLOOD GLUCOSE TEST test strip [id number] Yes  [provider]  Active Self            Recommendation:   Specialty provider follow-up oral surgeon as planned  Follow Up Plan:   Telephone follow-up in 1 month  Josette Pellet, RN, BSN [place]  [facility] Population Health RN Care Manager Direct Dial: [phone]  Fax: [phone]

## 2024-03-15 NOTE — Patient Outreach (Signed)
 Complex Care Management   Visit Note  02/13/2024  Name:  Mike Wright MRN: [id number] DOB: Oct 10, 1933  Situation: Referral received for Complex Care Management related to Diabetes with Complications. Outreach today focused on dental health and missing/loose teeth. I obtained verbal consent from Patient.  Visit completed with Patient  on the phone  Background:   Past Medical History:  Diagnosis Date   Anemia    a. mild, noted 04/2017.   CAD in native artery    a. [place]  04/2017 s/p DES to D1, DES to prox-mid LAD, DES to prox LAD overlapping the prior stent, LVEF 55-65%.    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 went home on home O2 04/2017.   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 Wonda & Dr. Lucas   Sinus bradycardia    a. HR 30s-40s in 04/2017 -> diltiazem  stopped, metoprolol  reduced.   Sleep apnea    chose not to order CPAP at this time (05/18/2017)   Type 2 diabetes mellitus (HCC)    Wears glasses     Assessment: Patient Reported Symptoms:  Cognitive Cognitive Status: Alert and oriented to person, place, and time, Normal speech and language skills, No symptoms reported Cognitive/Intellectual Conditions Management [RPT]: None reported or documented in medical history or problem list   Health Maintenance Behaviors: Annual physical exam Healing Pattern: Unsure Health  Facilitated by: Rest  Neurological Neurological Review of Symptoms: Not assessed    HEENT HEENT Symptoms Reported: Mouth or teeth pain HEENT Management Strategies: Routine screening, Diet modification HEENT Self-Management Outcome: 4 (good) HEENT Comment: Has missing and loose teeth. Appointmet with oral surgeon on 02/15/24 for dental extraction.    Cardiovascular Cardiovascular Symptoms Reported: No symptoms reported Weight: 165 lb (74.8 kg) Cardiovascular Self-Management Outcome: 4 (good) Cardiovascular Comment: has directions for stopping and restarting Eliquis  prior to dental extraction  Respiratory Respiratory Symptoms Reported: Not assesed    Endocrine Endocrine Symptoms Reported: Not assessed    Gastrointestinal Gastrointestinal Symptoms Reported: Change in appetite Gastrointestinal Management Strategies: Diet modification Gastrointestinal Self-Management Outcome: 4 (good) Gastrointestinal Comment: soft foods due to dental issues. Also reports decreased appetite.    Genitourinary Genitourinary Symptoms Reported: Not assessed    Integumentary Integumentary Symptoms Reported: No symptoms reported    Musculoskeletal Musculoskelatal Symptoms Reviewed: Not assessed        Psychosocial Psychosocial Symptoms Reported: No symptoms reported          03/15/2024    PHQ2-9 Depression Screening   Little interest or pleasure in doing things    Feeling down, depressed, or hopeless    PHQ-2 - Total Score    Trouble falling or staying asleep, or sleeping too much    Feeling tired or having little energy    Poor appetite  or overeating     Feeling bad about yourself - or that you are a failure or have let yourself or your family down    Trouble concentrating on things, such as reading the newspaper or watching television    Moving or speaking so slowly that other people could have noticed.  Or the opposite - being so fidgety or restless that you have been moving around a lot more  than usual    Thoughts that you would be better off dead, or hurting yourself in some way    PHQ2-9 Total Score    If you checked off any problems, how difficult have these problems made it for you to do your work, take care of things at home, or get along with other people    Depression Interventions/Treatment      Today's Vitals   02/13/24 1133  Weight: 165 lb (74.8 kg)      Medications Reviewed Today     Reviewed by Charlsie Josette SAILOR, RN (Registered Nurse) on 03/15/24 at 1124  Med List Status: <None>   Medication Order Taking? Sig Documenting Provider Last Dose Status Informant  Accu-Chek Softclix Lancets lancets [id number] Yes  [provider]  Active Self  acetaminophen  (TYLENOL ) 500 MG tablet [id number] Yes Take 1 tablet (500 mg total) by mouth every 6 (six) hours as needed for mild pain or headache. Milissa Tod PARAS, MD  Active Self  Alcohol  Swabs (B-D SINGLE USE SWABS REGULAR) PADS [id number] Yes  [provider]  Active Self  amLODipine  (NORVASC ) 2.5 MG tablet [id number] Yes TAKE 1 TABLET EVERY DAY Alvan Dorn FALCON, MD  Active     Discontinued 03/07/24 1600   apixaban  (ELIQUIS ) 2.5 MG TABS tablet [id number] Yes Take 2.5 mg by mouth 2 (two) times daily. [provider]  Active Self  atorvastatin  (LIPITOR ) 40 MG tablet [id number] Yes Take 40 mg by mouth daily at 6 PM. [provider]  Active Self           Med Note CLAUD MICHEAL ONEIDA [place] Feb 02, 2023  1:44 PM)    Blood Glucose Monitoring Suppl (ACCU-CHEK GUIDE) w/Device KIT [id number] Yes  [provider]  Active Self  Cholecalciferol  (VITAMIN D3) 50 MCG (2000 UT) TABS [id number] Yes Take 2,000 Units by mouth every evening. [provider]  Active Self  clopidogrel  (PLAVIX ) 75 MG tablet [id number] Yes Take 1 tablet (75 mg total) by mouth daily with breakfast. Charlyne Reed, PA-C  Active Self           Med Note CLAUD MICHEAL ONEIDA [place] Feb 02, 2023  1:44 PM)    Cyanocobalamin  (B-12)  1000 MCG TABS [id number] Yes Take 2,000 mcg by mouth in the morning. [provider]  Active Self  diazepam  (VALIUM ) 2 MG tablet [id number] Yes Take 2 mg by mouth daily as needed for anxiety. [provider]  Active Self  DROPLET INSULIN  SYRINGE 31G X 5/16 1 ML MISC [id number] Yes  [provider]  Active Self  DROPLET PEN NEEDLES 32G X 4 MM MISC [id number] Yes  [provider]  Active   folic acid  (FOLVITE ) 800 MCG tablet [id number] Yes Take 400 mcg by mouth at bedtime. [provider]  Active Self  insulin  glargine (LANTUS ) 100 UNIT/ML injection [id number] Yes Inject 30 Units into the skin 2 (two) times daily. [provider]  Active Self           Med Note (CLOUD, MEREDITH  A   Tue Feb 07, 2023  9:28 AM) Took 15 units PM 02/06/23  ipratropium-albuterol  (DUONEB) 0.5-2.5 (3) MG/3ML SOLN [id number] Yes Take 3 mLs by nebulization every 6 (six) hours as needed (wheezing/shortness of breath). [provider]  Active Self  isosorbide  mononitrate (IMDUR ) 60 MG 24 hr tablet [id number] Yes Take 1.5 tablets (90 mg total) by mouth 2 (two) times daily. Sebastian Lamarr SAUNDERS, PA-C  Active   metoprolol  tartrate (LOPRESSOR ) 100 MG tablet [id number] Yes Take by mouth. [provider]  Active   nitroGLYCERIN  (NITROSTAT ) 0.4 MG SL tablet [id number] Yes PLACE 1 TABLET (0.4 MG TOTAL) UNDER THE TONGUE EVERY 5 (FIVE) MINUTES AS NEEDED FOR CHEST PAIN. Miriam Norris, NP  Active   OXYGEN  [id number] Yes Inhale 2 L/min into the lungs See admin instructions. 2 L/min of oxygen  at bedtime and during the day as needed for shortness of breath [provider]  Active Self  pantoprazole  (PROTONIX ) 40 MG tablet [id number] Yes Take 40 mg by mouth 2 (two) times daily. [provider]  Active Self  Potassium Chloride  ER 20 MEQ TBCR [id number] Yes Take 1 tablet by mouth daily. [provider]  Active   potassium chloride  SA (KLOR-CON  M) 20 MEQ tablet  [id number] Yes Take 1 tablet (20 mEq total) by mouth 2 (two) times daily. Willette Adriana LABOR, MD  Active Self  psyllium (REGULOID) 0.52 g capsule [id number] Yes Take 0.52 g by mouth 2 (two) times daily. [provider]  Active Self  SPIRIVA  HANDIHALER 18 MCG inhalation capsule [id number] Yes Place 1 capsule into inhaler and inhale daily after breakfast.  [provider]  Active Self  torsemide  (DEMADEX ) 20 MG tablet [id number] Yes Take 1 tablet (20 mg total) by mouth 2 (two) times daily. Alvan Dorn FALCON, MD  Active   traZODone  (DESYREL ) 50 MG tablet [id number] Yes Take 100 mg by mouth at bedtime. [provider]  Active Self           Med Note JACKOLYN APOLINAR SAUNDERS   Wed Nov 16, 2022 11:26 AM)    TRUE METRIX BLOOD GLUCOSE TEST test strip [id number] Yes  [provider]  Active Self            Recommendation:   Specialty provider follow-up oral surgeon 02/15/24  Follow Up Plan:   Telephone follow-up in 1 week  Josette Pellet, RN, BSN [place]  [facility] RN Care Manager Direct Dial: [phone]  Fax: [phone]

## 2024-03-15 NOTE — Patient Instructions (Signed)
 Visit Information  Thank you for taking time to visit with me today. Please don't hesitate to contact me if I can be of assistance to you before our next scheduled appointment.  Your next care management appointment is by telephone on 04/01/24 at 11:00  Please call the care guide team at [phone] if you need to cancel, schedule, or reschedule an appointment.   Please call the [facility]: [phone] call 911 if you are experiencing a Mental Health or Behavioral Health Crisis or need someone to talk to.  Josette Pellet, RN, BSN [place]  [facility] RN Care Manager Direct Dial: [phone]  Fax: [phone]

## 2024-03-15 NOTE — Patient Outreach (Signed)
 Complex Care Management   Visit Note  01/04/2024  Name:  Mike Wright MRN: [id number] DOB: [birth year]  Situation: Referral received for Complex Care Management related to diabetes and high risk for falls. I obtained verbal consent from Patient.  Visit completed with Patient  on the phone  Background:   Past Medical History:  Diagnosis Date   Anemia    a. mild, noted 04/2017.   CAD in native artery    a. [place]  04/2017 s/p DES to D1, DES to prox-mid LAD, DES to prox LAD overlapping the prior stent, LVEF 55-65%.    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 went home on home O2 04/2017.   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 Wonda & Dr. Lucas   Sinus bradycardia    a. HR 30s-40s in 04/2017 -> diltiazem  stopped, metoprolol  reduced.   Sleep apnea    chose not to order CPAP at this time (05/18/2017)   Type 2 diabetes mellitus (HCC)    Wears glasses     Assessment: Patient Reported Symptoms:  Cognitive Cognitive Status: Alert and oriented to person, place, and time, Normal speech and language skills, No symptoms reported Cognitive/Intellectual Conditions Management [RPT]: None reported or documented in medical history or problem list   Health Maintenance Behaviors: Annual physical exam Healing Pattern: Unsure Health Facilitated by: Rest  Neurological Neurological Review of  Symptoms: No symptoms reported    HEENT HEENT Symptoms Reported: No symptoms reported      Cardiovascular Cardiovascular Symptoms Reported: No symptoms reported    Respiratory Respiratory Symptoms Reported: No symptoms reported    Endocrine Endocrine Symptoms Reported: No symptoms reported Is patient diabetic?: Yes Is patient checking blood sugars at home?: Yes List most recent blood sugar readings, include date and time of day: Fasting blood sugar 130 today. No readings less than 70 over the past 2 weeks. Endocrine Self-Management Outcome: 4 (good) Endocrine Comment: Managed by PCP. Last A1C 6.6% on 10/25/23. Abstracted from [facility] data into [facility].  Gastrointestinal Gastrointestinal Symptoms Reported: Not assessed      Genitourinary Genitourinary Symptoms Reported: Not assessed    Integumentary Integumentary Symptoms Reported: No symptoms reported    Musculoskeletal Musculoskelatal Symptoms Reviewed: Limited mobility Additional Musculoskeletal Details: using rolling walker for ambulation. History of falls with no injury. Encouraged to move carefully and change positions slowly. Continue to use walker. Musculoskeletal Management Strategies: Routine screening, Medical device Musculoskeletal Comment: wife assists with gait belt as needed Falls in the past year?: Yes Number of falls in past year: 2 or more Was there an injury with Fall?: No Fall Risk Category Calculator: 2 Patient Fall Risk Level: Moderate Fall Risk Patient at Risk for Falls Due to: History of fall(s), Impaired mobility, Impaired balance/gait Fall risk Follow up:  Falls prevention discussed, Falls evaluation completed  Psychosocial Psychosocial Symptoms Reported: No symptoms reported          03/15/2024    PHQ2-9 Depression Screening   Little interest or pleasure in doing things    Feeling down, depressed, or hopeless    PHQ-2 - Total Score    Trouble falling or staying asleep, or sleeping too much    Feeling tired  or having little energy    Poor appetite or overeating     Feeling bad about yourself - or that you are a failure or have let yourself or your family down    Trouble concentrating on things, such as reading the newspaper or watching television    Moving or speaking so slowly that other people could have noticed.  Or the opposite - being so fidgety or restless that you have been moving around a lot more than usual    Thoughts that you would be better off dead, or hurting yourself in some way    PHQ2-9 Total Score    If you checked off any problems, how difficult have these problems made it for you to do your work, take care of things at home, or get along with other people    Depression Interventions/Treatment      There were no vitals filed for this visit.    Medications Reviewed Today     Reviewed by Charlsie Josette SAILOR, RN (Registered Nurse) on 03/15/24 at 1029  Med List Status: <None>   Medication Order Taking? Sig Documenting Provider Last Dose Status Informant  Accu-Chek Softclix Lancets lancets [id number] Yes  [provider]  Active Self  acetaminophen  (TYLENOL ) 500 MG tablet [id number] Yes Take 1 tablet (500 mg total) by mouth every 6 (six) hours as needed for mild pain or headache. Milissa Tod PARAS, MD  Active Self  Alcohol  Swabs (B-D SINGLE USE SWABS REGULAR) PADS [id number] Yes  [provider]  Active Self  amLODipine  (NORVASC ) 2.5 MG tablet [id number] Yes TAKE 1 TABLET EVERY DAY Alvan Dorn FALCON, MD  Active     Discontinued 03/07/24 1600   apixaban  (ELIQUIS ) 2.5 MG TABS tablet [id number] Yes Take 2.5 mg by mouth 2 (two) times daily. [provider]  Active Self  atorvastatin  (LIPITOR ) 40 MG tablet [id number] Yes Take 40 mg by mouth daily at 6 PM. [provider]  Active Self           Med Note CLAUD MICHEAL ONEIDA [place] Feb 02, 2023  1:44 PM)    Blood Glucose Monitoring Suppl (ACCU-CHEK GUIDE) w/Device KIT [id number] Yes  [provider]   Active Self  Cholecalciferol  (VITAMIN D3) 50 MCG (2000 UT) TABS [id number] Yes Take 2,000 Units by mouth every evening. [provider]  Active Self  clopidogrel  (PLAVIX ) 75 MG tablet [id number] Yes Take 1 tablet (75 mg total) by mouth daily with breakfast. Charlyne Reed, PA-C  Active Self           Med Note CLAUD MICHEAL ONEIDA [place] Feb 02, 2023  1:44 PM)    Cyanocobalamin  (B-12) 1000 MCG TABS [id number] Yes Take 2,000 mcg by mouth in the morning. [provider]  Active Self  diazepam  (VALIUM ) 2 MG tablet [id number] Yes Take 2 mg by mouth daily as needed for anxiety. [provider]  Active Self  DROPLET INSULIN  SYRINGE 31G X 5/16 1 ML MISC [id number] Yes  [provider]  Active Self  DROPLET PEN NEEDLES 32G  X 4 MM MISC [id number] Yes  [provider]  Active   folic acid  (FOLVITE ) 800 MCG tablet [id number] Yes Take 400 mcg by mouth at bedtime. [provider]  Active Self  insulin  glargine (LANTUS ) 100 UNIT/ML injection [id number] Yes Inject 30 Units into the skin 2 (two) times daily. [provider]  Active Self           Med Note JIM, MEREDITH A   Tue Feb 07, 2023  9:28 AM) Took 15 units PM 02/06/23  ipratropium-albuterol  (DUONEB) 0.5-2.5 (3) MG/3ML SOLN [id number] Yes Take 3 mLs by nebulization every 6 (six) hours as needed (wheezing/shortness of breath). [provider]  Active Self    Discontinued 01/25/24 1343 (Reorder)   metoprolol  tartrate (LOPRESSOR ) 100 MG tablet [id number] Yes Take by mouth. [provider]  Active   nitroGLYCERIN  (NITROSTAT ) 0.4 MG SL tablet [id number] Yes PLACE 1 TABLET (0.4 MG TOTAL) UNDER THE TONGUE EVERY 5 (FIVE) MINUTES AS NEEDED FOR CHEST PAIN. Miriam Norris, NP  Active   OXYGEN  [id number] Yes Inhale 2 L/min into the lungs See admin instructions. 2 L/min of oxygen  at bedtime and during the day as needed for shortness of breath [provider]  Active Self  pantoprazole   (PROTONIX ) 40 MG tablet [id number] Yes Take 40 mg by mouth 2 (two) times daily. [provider]  Active Self  Potassium Chloride  ER 20 MEQ TBCR [id number] Yes Take 1 tablet by mouth daily. [provider]  Active   potassium chloride  SA (KLOR-CON  M) 20 MEQ tablet [id number] Yes Take 1 tablet (20 mEq total) by mouth 2 (two) times daily. Willette Adriana LABOR, MD  Active Self  psyllium (REGULOID) 0.52 g capsule [id number] Yes Take 0.52 g by mouth 2 (two) times daily. [provider]  Active Self  SPIRIVA  HANDIHALER 18 MCG inhalation capsule [id number] Yes Place 1 capsule into inhaler and inhale daily after breakfast.  [provider]  Active Self  torsemide  (DEMADEX ) 20 MG tablet [id number] Yes Take 1 tablet (20 mg total) by mouth 2 (two) times daily. Alvan Dorn FALCON, MD  Active   traZODone  (DESYREL ) 50 MG tablet [id number] Yes Take 100 mg by mouth at bedtime. [provider]  Active Self           Med Note JACKOLYN APOLINAR SAUNDERS   Wed Nov 16, 2022 11:26 AM)    TRUE METRIX BLOOD GLUCOSE TEST test strip [id number] Yes  [provider]  Active Self            Recommendation:   PCP Follow-up Continue Current Plan of Care  Follow Up Plan:   Telephone follow-up in 1 month  Josette Pellet, RN, BSN [place]  [facility] RN Care Manager Direct Dial: [phone]  Fax: [phone]

## 2024-03-15 NOTE — Patient Instructions (Signed)
 Visit Information  Thank you for taking time to visit with me today. Please don't hesitate to contact me if I can be of assistance to you before our next scheduled appointment.  Your next care management appointment is by telephone on 02/23/24 at 11:30  Please call the care guide team at [phone] if you need to cancel, schedule, or reschedule an appointment.   Please call the [facility]: [phone] call 911 if you are experiencing a Mental Health or Behavioral Health Crisis or need someone to talk to.  Josette Pellet, RN, BSN [place]  [facility] RN Care Manager Direct Dial: [phone]  Fax: [phone]

## 2024-04-01 ENCOUNTER — Telehealth: Payer: Self-pay | Admitting: *Deleted

## 2024-04-05 ENCOUNTER — Other Ambulatory Visit: Payer: Self-pay | Admitting: *Deleted

## 2024-04-05 ENCOUNTER — Encounter: Payer: Self-pay | Admitting: *Deleted

## 2024-04-09 ENCOUNTER — Ambulatory Visit: Admitting: Cardiology

## 2024-04-09 NOTE — Progress Notes (Unsigned)
 "     Clinical Summary Mike Wright is a 89 y.o.male  seen today for focused visit on recent issues with diastolic HF.      1. Chronic HFpEF - 10/2021 echo: LVEF 70-75%, indet diastolic, normal RV, severe LAE, mod AS AVA VTI 1.14 mean grad 25  02/2023 echo: LVEF 65-70%, no WMAs, normal RV function. Normal TAVR valve.  - 12/2023 echo: LVEF 60-65%, normal TAVR valve - no recent SOB/DOE, chronic mild edema - compliant with meds. Taking torsemide  20mg  bid.  - home scale 165-175 lbs.      2.Aortic stenosis - s/p TAVR 01/2023 - Edwards Sapien 3 Ultra Resilia THV size 26 mm  - unresponsive after procedure, stroke imaging was benign - recovered neurologically back to his baseline   02/2023 echo: LVEF 65-70%, no WMAs, normal RV function. Normal TAVR valve.   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 The apparent finding is likely too small for MRA to be of utility as a follow up imaging modality.   - from 03/17/23 cards appt patient did not want repeat imaging      4. CAD with stable angina - nonobstructive CAD by cath Jan 2012, LVEF 60-65% by LV gram.  - 06/2015 nuclear stress without clear ischemia - 05/2015 echo LVEF 65-70%, no WMAs, cannot evaluate diastolic function  09/2016 lexiscan  without ischemia, low risk study.    - cath 04/2017 as reported below. Received DES to 75% D1, DES to 75% mid to distal LAD, DES to 70% prox LAD RHC with CI 2.67, mean PA 25, no wedge reported by LVEDP 14.  - discharged on triple therapy with ASA, plavix , eliquis  with plan for 30 days, then stop ASA.      01/2020 nuclear stress: Small, mild intensity, mid to basal inferior/inferoseptal defect that is fixed and consistent with soft tissue attenuation in light of normal wall  motion. No significant ischemic territories.    [facility] 12/16/22 showed continued patency of the stented segments in the LAD and diagonal, nonobstructive plaquing in the circumflex and RCA, and no high-grade stenoses  - nonspecific chest pains at times, usually time.        3. Afib - admission 11/2018 with issues with afib - from notes had afib with slow rates, toprol  was stopped   - can have some palpitations at times - 3-4 times daily, short in duration - some dizziness more consistent with meclizine , consistent with vertigo.  - he is not sure if he resrated toprol  or not, we had previously stopped due to low HRs   4.History of GI bleeding - GI bleed during 11/2018, stopped eliquis  at that time, later restarted - heme +positive stools, negative endoscopy with plans for outpatient pill endoscopy     5. PAD - followed by vascular - admission 08/2017 with ischemia great toes. Had intervention on lower extremity vessels at that time.  -  ultimately required ampuation bilateral great toes   - admit 11/2017 for poor healing right great toe amputation, critical limb ischemia - admit 02/2019 with gangrene right second toe with nonhealing diabetic foot wound. S/p amputations - additional toe amputation 05/2019   - had repeat partial amputation 01/2020 - continues to follow with vascular, last appt 07/17/23  6. PSVT/Palpitations.  - no recent symptoms.    7. Chronic anemia - has received multiple transfusions - followed by closely.    8. Hyperlipidemia - 09/2021 TC 71 TG 74 HDL 29 LDL 26 - he is on atorvastatin  80mg  daily.    01/2023 TC 81 TG 89 HDL 20 LDL 43   Past Medical History:  Diagnosis Date   Anemia    a. mild, noted 04/2017.   CAD in native artery    a. [place]  04/2017 s/p DES to D1, DES to prox-mid LAD, DES to prox LAD overlapping the prior stent, LVEF 55-65%.    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 went home on home O2 04/2017.   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 Wonda & Dr. Lucas   Sinus bradycardia    a. HR 30s-40s in 04/2017 -> diltiazem  stopped, metoprolol  reduced.   Sleep apnea    chose not to order CPAP at this time (05/18/2017)   Type 2 diabetes mellitus (HCC)    Wears glasses      Allergies[1]   Current Outpatient Medications  Medication Sig Dispense Refill   Accu-Chek Softclix Lancets lancets      acetaminophen  (TYLENOL ) 500 MG tablet Take 1 tablet (500 mg total) by mouth every 6 (six) hours as needed for mild pain or headache. 30 tablet 0   Alcohol  Swabs (B-D SINGLE USE SWABS REGULAR) PADS      amLODipine  (NORVASC ) 2.5 MG tablet TAKE 1 TABLET EVERY DAY 90 tablet 3   amoxicillin  (AMOXIL ) 500 MG capsule TAKE 4 CAPSULES BY MOUTH 1 HOUR BEFORE DENTAL APPOINTMENT (Patient not taking: Reported on 04/05/2024) 4 capsule 3   apixaban  (ELIQUIS ) 2.5 MG TABS tablet Take 2.5 mg by mouth 2 (two) times daily.     atorvastatin  (LIPITOR ) 40 MG tablet Take 40 mg by mouth daily at 6 PM.     Blood Glucose Monitoring Suppl (ACCU-CHEK GUIDE) w/Device KIT      Cholecalciferol  (VITAMIN D3) 50 MCG (2000 UT) TABS Take 2,000 Units by mouth every evening.     clopidogrel  (PLAVIX ) 75 MG tablet Take 1 tablet (75 mg total) by mouth daily with breakfast. 30 tablet 2   Cyanocobalamin  (B-12) 1000 MCG TABS Take 2,000 mcg by mouth in the morning.     diazepam   (VALIUM ) 2 MG tablet Take 2 mg by mouth daily as needed for anxiety.     DROPLET INSULIN  SYRINGE 31G X 5/16 1 ML MISC      DROPLET PEN NEEDLES 32G X 4 MM MISC      folic acid  (FOLVITE ) 800 MCG tablet Take 400 mcg by mouth at bedtime.     insulin  glargine (LANTUS ) 100 UNIT/ML injection Inject 30 Units into the skin 2 (two)  times daily.     ipratropium-albuterol  (DUONEB) 0.5-2.5 (3) MG/3ML SOLN Take 3 mLs by nebulization every 6 (six) hours as needed (wheezing/shortness of breath).     isosorbide  mononitrate (IMDUR ) 60 MG 24 hr tablet Take 1.5 tablets (90 mg total) by mouth 2 (two) times daily. 270 tablet 3   metoprolol  tartrate (LOPRESSOR ) 100 MG tablet Take by mouth.     nitroGLYCERIN  (NITROSTAT ) 0.4 MG SL tablet PLACE 1 TABLET (0.4 MG TOTAL) UNDER THE TONGUE EVERY 5 (FIVE) MINUTES AS NEEDED FOR CHEST PAIN. 25 tablet 2   OXYGEN  Inhale 2 L/min into the lungs See admin instructions. 2 L/min of oxygen  at bedtime and during the day as needed for shortness of breath     pantoprazole  (PROTONIX ) 40 MG tablet Take 40 mg by mouth 2 (two) times daily.     Potassium Chloride  ER 20 MEQ TBCR Take 1 tablet by mouth daily.     potassium chloride  SA (KLOR-CON  M) 20 MEQ tablet Take 1 tablet (20 mEq total) by mouth 2 (two) times daily. (Patient not taking: Reported on 04/05/2024) 30 tablet 1   psyllium (REGULOID) 0.52 g capsule Take 0.52 g by mouth 2 (two) times daily.     SPIRIVA  HANDIHALER 18 MCG inhalation capsule Place 1 capsule into inhaler and inhale daily after breakfast.      torsemide  (DEMADEX ) 20 MG tablet Take 1 tablet (20 mg total) by mouth 2 (two) times daily. 180 tablet 2   traZODone  (DESYREL ) 50 MG tablet Take 100 mg by mouth at bedtime.     TRUE METRIX BLOOD GLUCOSE TEST test strip      No current facility-administered medications for this visit.     Past Surgical History:  Procedure Laterality Date   ABDOMINAL AORTOGRAM W/LOWER EXTREMITY N/A 09/15/2017   Procedure: ABDOMINAL AORTOGRAM W/LOWER  EXTREMITY;  Surgeon: Serene Gaile ORN, MD;  Location: [facility];  Service: Cardiovascular;  Laterality: N/A;   ABDOMINAL AORTOGRAM W/LOWER EXTREMITY N/A 02/04/2020   Procedure: ABDOMINAL AORTOGRAM W/LOWER EXTREMITY;  Surgeon: Serene Gaile ORN, MD;  Location: [facility];  Service: Cardiovascular;  Laterality: N/A;   ABDOMINAL AORTOGRAM W/LOWER EXTREMITY N/A 12/15/2020   Procedure: ABDOMINAL AORTOGRAM W/LOWER EXTREMITY;  Surgeon: Serene Gaile ORN, MD;  Location: [facility];  Service: Cardiovascular;  Laterality: N/A;   AMPUTATION Bilateral 09/20/2017   Procedure: BILATERAL GREAT TOE AMPUTATIONS INCLUDING METATARSAL HEADS;  Surgeon: Serene Gaile ORN, MD;  Location: [facility] OR;  Service: Vascular;  Laterality: Bilateral;   AMPUTATION Right 03/01/2019   Procedure: AMPUTATION TOES;  Surgeon: Eliza Lonni RAMAN, MD;  Location: [facility] OR;  Service: Vascular;  Laterality: Right;   AMPUTATION Right 05/29/2019   Procedure: AMPUTATION RIGHT THIRD TOE;  Surgeon: Serene Gaile ORN, MD;  Location: [facility] OR;  Service: Vascular;  Laterality: Right;   AMPUTATION Right 09/26/2019   Procedure: AMPUTATION RIGHT FOURTH TOE AND FIFTH TOES;  Surgeon: Serene Gaile ORN, MD;  Location: [facility] OR;  Service: Vascular;  Laterality: Right;   ANGIOPLASTY Right 09/20/2017   Procedure: ANGIOPLASTY RIGHT TIBIAL ARTERY;  Surgeon: Serene Gaile ORN, MD;  Location: [facility] OR;  Service: Vascular;  Laterality: Right;   APPENDECTOMY     APPLICATION OF WOUND VAC  12/17/2020   Procedure: APPLICATION OF WOUND VAC;  Surgeon: Serene Gaile ORN, MD;  Location: [facility] OR;  Service: Vascular;;   BACK SURGERY     CARDIAC CATHETERIZATION  1980s; 2012;   CATARACT EXTRACTION Left 2004   COLONOSCOPY  2004  FLEISHMAN TICS   COLONOSCOPY WITH PROPOFOL  N/A 11/28/2018   Procedure: COLONOSCOPY WITH PROPOFOL ;  Surgeon: Golda Claudis PENNER, MD;  Location: [facility];  Service: Endoscopy;  Laterality: N/A;   CORONARY ANGIOPLASTY WITH STENT PLACEMENT  05/18/2017    3 stents   CORONARY STENT INTERVENTION N/A 05/18/2017   Procedure: CORONARY STENT INTERVENTION;  Surgeon: Dann Candyce RAMAN, MD;  Location: [facility] INVASIVE CV LAB;  Service: Cardiovascular;  Laterality: N/A;   ESOPHAGOGASTRODUODENOSCOPY (EGD) WITH PROPOFOL  N/A 11/20/2017   Procedure: ESOPHAGOGASTRODUODENOSCOPY (EGD) WITH PROPOFOL ;  Surgeon: Abran Norleen SAILOR, MD;  Location: [facility] ENDOSCOPY;  Service: Gastroenterology;  Laterality: N/A;   ESOPHAGOGASTRODUODENOSCOPY (EGD) WITH PROPOFOL  N/A 11/28/2018   Procedure: ESOPHAGOGASTRODUODENOSCOPY (EGD) WITH PROPOFOL ;  Surgeon: Golda Claudis PENNER, MD;  [facility]: [facility];  Service: Endoscopy;  Laterality: N/A;   ESOPHAGOGASTRODUODENOSCOPY (EGD) WITH PROPOFOL  N/A 07/12/2019   Procedure: ESOPHAGOGASTRODUODENOSCOPY (EGD) WITH PROPOFOL ;  Surgeon: Golda Claudis PENNER, MD;  [facility]: [facility];  Service: Endoscopy;  Laterality: N/A;  125   GIVENS CAPSULE STUDY N/A 12/17/2018   Procedure: GIVENS CAPSULE STUDY;  Surgeon: Golda Claudis PENNER, MD;  Location: [facility];  Service: Endoscopy;  Laterality: N/A;  7:30am   I & D EXTREMITY Right 12/17/2017   Procedure: IRRIGATION AND DEBRIDEMENT RIGHT GREAT TOE;  Surgeon: Serene Gaile ORN, MD;  Location: [facility];  Service: Vascular;  Laterality: Right;   INCISION AND DRAINAGE Right 12/17/2020   Procedure: INCISION AND DRAINAGE OF RIGHT FOOT;  Surgeon: Serene Gaile ORN, MD;  Location: [facility];  Service: Vascular;  Laterality: Right;   INTRAOPERATIVE TRANSTHORACIC ECHOCARDIOGRAM N/A 02/07/2023   Procedure: INTRAOPERATIVE TRANSTHORACIC ECHOCARDIOGRAM;  Surgeon: Wonda Sharper, MD;  Location: [facility] INVASIVE CV LAB;  Service: Cardiovascular;  Laterality: N/A;   JOINT REPLACEMENT     LEFT HEART CATH AND CORONARY ANGIOGRAPHY N/A 05/18/2017   Procedure: LEFT HEART CATH AND CORONARY ANGIOGRAPHY;  Surgeon: Dann Candyce RAMAN, MD;  Location: [facility] INVASIVE CV LAB;  Service: Cardiovascular;  Laterality: N/A;   LOWER EXTREMITY ANGIOGRAM Right  09/20/2017   Procedure: RIGHT LOWER LEG  ANGIOGRAM;  Surgeon: Serene Gaile ORN, MD;  Location: [facility];  Service: Vascular;  Laterality: Right;   LUMBAR FUSION  2002   L3, 4 L4, 5 L5 S1 Fused by Dr. Oneil Carwin   PERIPHERAL VASCULAR BALLOON ANGIOPLASTY Left 09/15/2017   Procedure: PERIPHERAL VASCULAR BALLOON ANGIOPLASTY;  Surgeon: Serene Gaile ORN, MD;  Location: [facility];  Service: Cardiovascular;  Laterality: Left;  PTA of Peroneal & Posterior Tibial   PERIPHERAL VASCULAR INTERVENTION Right 12/15/2020   Procedure: PERIPHERAL VASCULAR INTERVENTION;  Surgeon: Serene Gaile ORN, MD;  Location: [facility];  Service: Cardiovascular;  Laterality: Right;  SFA   POLYPECTOMY  11/28/2018   Procedure: POLYPECTOMY;  Surgeon: Golda Claudis PENNER, MD;  Location: [facility];  Service: Endoscopy;;  duodenum   POSTERIOR LUMBAR FUSION     RHINOPLASTY     RIGHT HEART CATH N/A 05/18/2017   Procedure: RIGHT HEART CATH;  Surgeon: Dann Candyce RAMAN, MD;  Location: [facility] INVASIVE CV LAB;  Service: Cardiovascular;  Laterality: N/A;   RIGHT/LEFT HEART CATH AND CORONARY ANGIOGRAPHY N/A 12/16/2022   Procedure: RIGHT/LEFT HEART CATH AND CORONARY ANGIOGRAPHY;  Surgeon: Wonda Sharper, MD;  Location: [facility] INVASIVE CV LAB;  Service: Cardiovascular;  Laterality: N/A;   TEE WITHOUT CARDIOVERSION N/A 09/18/2017   Procedure: TRANSESOPHAGEAL ECHOCARDIOGRAM (TEE);  Surgeon: Okey Vina GAILS, MD;  Location: [facility] ENDOSCOPY;  Service: Cardiovascular;  Laterality:  N/A;   TOTAL KNEE ARTHROPLASTY Bilateral    TRANSMETATARSAL AMPUTATION Right 02/04/2020   Procedure: RIGHT TRANSMETATARSAL AMPUTATION;  Surgeon: Serene Gaile ORN, MD;  Location: [facility] OR;  Service: Vascular;  Laterality: Right;   TRANSMETATARSAL AMPUTATION Left 11/12/2021   Procedure: LEFT TRANSMETATARSAL AMPUTATION;  Surgeon: Sheree Penne Bruckner, MD;  Location: [facility] OR;  Service: Vascular;  Laterality: Left;   TRANSURETHRAL RESECTION OF PROSTATE  2001   Mike Wright      Allergies[2]    Family History  Problem Relation Age of Onset   Heart attack Father 61   COPD Father    COPD Mother    Heart disease Mother    Diabetes Mother    Hypertension Sister    CVA Sister    Diabetes Sister    Multiple sclerosis Sister      Social History Mr. Loyal reports that he quit smoking about 41 years ago. His smoking use included cigarettes. He started smoking about 71 years ago. He has a 60 pack-year smoking history. He has never used smokeless tobacco. Mr. Pham reports that he does not currently use alcohol .   Review of Systems CONSTITUTIONAL: No weight loss, fever, chills, weakness or fatigue.  HEENT: Eyes: No visual loss, blurred vision, double vision or yellow sclerae.No hearing loss, sneezing, congestion, runny nose or sore throat.  SKIN: No rash or itching.  CARDIOVASCULAR:  RESPIRATORY: No shortness of breath, cough or sputum.  GASTROINTESTINAL: No anorexia, nausea, vomiting or diarrhea. No abdominal pain or blood.  GENITOURINARY: No burning on urination, no polyuria NEUROLOGICAL: No headache, dizziness, syncope, paralysis, ataxia, numbness or tingling in the extremities. No change in bowel or bladder control.  MUSCULOSKELETAL: No muscle, back pain, joint pain or stiffness.  LYMPHATICS: No enlarged nodes. No history of splenectomy.  PSYCHIATRIC: No history of depression or anxiety.  ENDOCRINOLOGIC: No reports of sweating, cold or heat intolerance. No polyuria or polydipsia.  SABRA   Physical Examination There were no vitals filed for this visit. There were no vitals filed for this visit.  Gen: resting comfortably, no acute distress HEENT: no scleral icterus, pupils equal round and reactive, no palptable cervical adenopathy,  CV Resp: Clear to auscultation bilaterally GI: abdomen is soft, non-tender, non-distended, normal bowel sounds, no hepatosplenomegaly MSK: extremities are warm, no edema.  Skin: warm, no rash Neuro:  no focal  deficits Psych: appropriate affect   Diagnostic Studies  05/2015 echo Study Conclusions   - Left ventricle: The cavity size was normal. Wall thickness was   increased increased in a pattern of mild to moderate LVH.   Systolic function was vigorous. The estimated ejection fraction   was in the range of 65% to 70%. Wall motion was normal; there   were no regional wall motion abnormalities. The study is not   technically sufficient to allow evaluation of LV diastolic   function. - Aortic valve: Moderately calcified annulus. Mildly thickened   leaflets. There was mild stenosis. There was mild regurgitation.   Mean gradient (S): 13 mm Hg. Valve area (VTI): 1.51 cm^2. - Mitral valve: Mildly calcified annulus. Normal thickness leaflets   . - Left atrium: The atrium was moderately dilated. - Technically adequate study.     06/2015 Nuclear stress test No diagnostic ST segment changes to indicate ischemia. Small, mild intensity, perfusion defects noted in the apical anterior and basal inferolateral walls. This is most consistent with soft tissue attenuation given normal wall motion in these regions. No large ischemic zones noted. This is a  low risk study. Nuclear stress EF: 64%.   05/2016 Event monitor Rhythm is atrial fibrillation throughout study. Occasioanal PVCs Min HR 51, Max HR 107, Avg HR 67 Reported symptoms correspond with rate controlled atrial fibrillation.   09/2016 nuclear stress There was no ST segment deviation noted during stress. The study is normal. There are no perfusion defects consistent with prior infarct or current ischemia. This is a low risk study. The left ventricular ejection fraction is normal (55-65%).     04/2017 cath Dist RCA lesion is 40% stenosed. Prox RCA lesion is 25% stenosed. 1st Diag lesion is 75% stenosed. A drug-eluting stent was successfully placed using a STENT SYNERGY DES 2.25X12. Post intervention, there is a 0% residual stenosis. Prox LAD  to Mid LAD lesion is 75% stenosed. A drug-eluting stent was successfully placed using a STENT SYNERGY DES 3.5X38. Post intervention, there is a 0% residual stenosis. Prox LAD lesion is 70% stenosed. A drug-eluting stent was successfully placed using a STENT SYNERGY DES 4X12, overlapping the prior stent. Post intervention, there is a 0% residual stenosis. The left ventricular systolic function is normal. The left ventricular ejection fraction is 55-65% by visual estimate. LV end diastolic pressure is normal. There is no aortic valve stenosis. Ao sat 98%, PA sat 65%; PA mean 25 mm Hg; unable to wedge the catheter. Normal right heat pressures. Tortuous right subclavian making catheter navigation difficult from the right radial approach.   Continue dual antiplatelet therapy along with Eliquis  for 30 days.   Restart Eliquis  tomorrow.   After 30 days, stop aspirin .  Continue Plavix  and Eliquis .     After 6 months, can consider stopping clopidogrel  if he has bleeding issues.  Given the nomber of stents, would try to complete one year of clopidogrel  if no bleeding issues.    01/2020 nuclear stress Small, mild intensity, mid to basal inferior/inferoseptal defect that is fixed and consistent with soft tissue attenuation in light of normal wall motion. No significant ischemic territories. This is a low risk study. Nuclear stress EF: 63%.  12/2023 echo 1. Left ventricular ejection fraction, by estimation, is 60 to 65%. The  left ventricle has normal function. The left ventricle has no regional  wall motion abnormalities. Left ventricular diastolic parameters are  indeterminate.   2. Right ventricular systolic function is normal. The right ventricular  size is mildly enlarged. There is mildly elevated pulmonary artery  systolic pressure. The estimated right ventricular systolic pressure is  43.4 mmHg.   3. Left atrial size was severely dilated.   4. The mitral valve is normal in structure.  Trivial mitral valve  regurgitation. No evidence of mitral stenosis.   5. The aortic valve has been repaired/replaced. Aortic valve  regurgitation is not visualized. No aortic stenosis is present. There is a  26 mm Sapien prosthetic (TAVR) valve present in the aortic position.  Procedure Date: 02/07/2023. Echo findings are  consistent with normal structure and function of the aortic valve  prosthesis. Aortic valve mean gradient measures 8.8 mmHg. Aortic valve  Vmax measures 2.06 m/s.   6. The inferior vena cava is normal in size with greater than 50%  respiratory variability, suggesting right atrial pressure of 3 mmHg.    Assessment and Plan   1.Chronic HFpEF - euvolemic, continue current meds   2. Afib - low rates today, denies significant symptoms however - he thinks he may have restarted metoprolol  at home, he will call us  from home to clarify, if so then d/c  medication. Watchful waiting for his afib with slow VR - continue eliquis    3. CAD - no symptoms, continue current meds   4. HLD - at goal, continue current meds     Dorn PHEBE Ross, M.D., F.A.C.C.    [1]  Allergies Allergen Reactions   Codeine Shortness Of Breath   Doxycycline  Swelling    Swelling and numbness in lips and face. Swelling improved after stopping. Reports still experiences numbness in bottom lip.    Feraheme  [Ferumoxytol ] Other (See Comments)    Diaphoretic, chest pain   Heparin  Other (See Comments)    +HIT,  Severe bleeding (with heparin  drip & large doses), tolerates low doses   Iron  Shortness Of Breath    Patient severe reaction to IV iron  and does not tolerate oral formulations either   Losartan Swelling   Oxycodone Other (See Comments)    Made me act out of my mind Mental status changes- hallucinations   Latex     Added based on information entered during case entry, please review and add reactions, type, and severity as needed   Trelegy Ellipta [Fluticasone-Umeclidin-Vilant]      Blood clots in throat  [2]  Allergies Allergen Reactions   Codeine Shortness Of Breath   Doxycycline  Swelling    Swelling and numbness in lips and face. Swelling improved after stopping. Reports still experiences numbness in bottom lip.    Feraheme  [Ferumoxytol ] Other (See Comments)    Diaphoretic, chest pain   Heparin  Other (See Comments)    +HIT,  Severe bleeding (with heparin  drip & large doses), tolerates low doses   Iron  Shortness Of Breath    Patient severe reaction to IV iron  and does not tolerate oral formulations either   Losartan Swelling   Oxycodone Other (See Comments)    Made me act out of my mind Mental status changes- hallucinations   Latex     Added based on information entered during case entry, please review and add reactions, type, and severity as needed   Trelegy Ellipta [Fluticasone-Umeclidin-Vilant]     Blood clots in throat   "

## 2024-04-19 ENCOUNTER — Encounter: Payer: Self-pay | Admitting: *Deleted

## 2024-04-19 ENCOUNTER — Other Ambulatory Visit: Payer: Self-pay | Admitting: *Deleted

## 2024-05-06 ENCOUNTER — Telehealth

## 2024-07-03 ENCOUNTER — Ambulatory Visit: Admitting: Cardiology
